# Patient Record
Sex: Female | Born: 1950
Health system: Southern US, Community
[De-identification: ages and names within clinical notes are randomized; demographics above are authoritative.]

## PROBLEM LIST (undated history)

## (undated) ENCOUNTER — Ambulatory Visit: Admission: EM | Payer: Medicare Other | Source: Home / Self Care

## (undated) DIAGNOSIS — J189 Pneumonia, unspecified organism: Secondary | ICD-10-CM

## (undated) DIAGNOSIS — E119 Type 2 diabetes mellitus without complications: Secondary | ICD-10-CM

## (undated) DIAGNOSIS — J302 Other seasonal allergic rhinitis: Secondary | ICD-10-CM

## (undated) DIAGNOSIS — C50911 Malignant neoplasm of unspecified site of right female breast: Secondary | ICD-10-CM

## (undated) DIAGNOSIS — J441 Chronic obstructive pulmonary disease with (acute) exacerbation: Secondary | ICD-10-CM

## (undated) DIAGNOSIS — F419 Anxiety disorder, unspecified: Secondary | ICD-10-CM

## (undated) DIAGNOSIS — R0602 Shortness of breath: Secondary | ICD-10-CM

## (undated) DIAGNOSIS — Z923 Personal history of irradiation: Secondary | ICD-10-CM

## (undated) DIAGNOSIS — K5732 Diverticulitis of large intestine without perforation or abscess without bleeding: Secondary | ICD-10-CM

## (undated) DIAGNOSIS — K219 Gastro-esophageal reflux disease without esophagitis: Secondary | ICD-10-CM

## (undated) DIAGNOSIS — K5900 Constipation, unspecified: Secondary | ICD-10-CM

## (undated) DIAGNOSIS — Z720 Tobacco use: Secondary | ICD-10-CM

## (undated) DIAGNOSIS — R197 Diarrhea, unspecified: Secondary | ICD-10-CM

## (undated) DIAGNOSIS — E669 Obesity, unspecified: Secondary | ICD-10-CM

## (undated) DIAGNOSIS — E785 Hyperlipidemia, unspecified: Secondary | ICD-10-CM

## (undated) DIAGNOSIS — R519 Headache, unspecified: Secondary | ICD-10-CM

## (undated) DIAGNOSIS — R600 Localized edema: Secondary | ICD-10-CM

## (undated) DIAGNOSIS — Z9221 Personal history of antineoplastic chemotherapy: Secondary | ICD-10-CM

## (undated) DIAGNOSIS — R51 Headache: Secondary | ICD-10-CM

## (undated) DIAGNOSIS — I10 Essential (primary) hypertension: Secondary | ICD-10-CM

## (undated) DIAGNOSIS — R9431 Abnormal electrocardiogram [ECG] [EKG]: Secondary | ICD-10-CM

## (undated) DIAGNOSIS — J449 Chronic obstructive pulmonary disease, unspecified: Secondary | ICD-10-CM

## (undated) DIAGNOSIS — M1990 Unspecified osteoarthritis, unspecified site: Secondary | ICD-10-CM

## (undated) DIAGNOSIS — R011 Cardiac murmur, unspecified: Secondary | ICD-10-CM

## (undated) DIAGNOSIS — J019 Acute sinusitis, unspecified: Secondary | ICD-10-CM

## (undated) DIAGNOSIS — F32A Depression, unspecified: Secondary | ICD-10-CM

## (undated) DIAGNOSIS — F329 Major depressive disorder, single episode, unspecified: Secondary | ICD-10-CM

## (undated) HISTORY — DX: Chronic obstructive pulmonary disease, unspecified: J44.9

## (undated) HISTORY — PX: ABDOMINAL HYSTERECTOMY: SHX81

## (undated) HISTORY — DX: Depression, unspecified: F32.A

## (undated) HISTORY — DX: Diverticulitis of large intestine without perforation or abscess without bleeding: K57.32

## (undated) HISTORY — DX: Chronic obstructive pulmonary disease with (acute) exacerbation: J44.1

## (undated) HISTORY — DX: Essential (primary) hypertension: I10

## (undated) HISTORY — DX: Tobacco use: Z72.0

## (undated) HISTORY — DX: Major depressive disorder, single episode, unspecified: F32.9

## (undated) HISTORY — DX: Obesity, unspecified: E66.9

## (undated) HISTORY — DX: Hyperlipidemia, unspecified: E78.5

## (undated) HISTORY — DX: Anxiety disorder, unspecified: F41.9

## (undated) HISTORY — DX: Gastro-esophageal reflux disease without esophagitis: K21.9

## (undated) HISTORY — PX: BACK SURGERY: SHX140

---
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
## 1989-11-08 DIAGNOSIS — C50911 Malignant neoplasm of unspecified site of right female breast: Secondary | ICD-10-CM

## 1989-11-08 HISTORY — DX: Malignant neoplasm of unspecified site of right female breast: C50.911

## 1989-11-08 HISTORY — PX: BREAST LUMPECTOMY: SHX2

## 1998-06-23 ENCOUNTER — Emergency Department (HOSPITAL_COMMUNITY): Admission: EM | Admit: 1998-06-23 | Discharge: 1998-06-23 | Payer: Self-pay | Admitting: Emergency Medicine

## 1998-11-05 ENCOUNTER — Inpatient Hospital Stay (HOSPITAL_COMMUNITY): Admission: EM | Admit: 1998-11-05 | Discharge: 1998-11-07 | Payer: Self-pay | Admitting: Internal Medicine

## 1998-11-05 ENCOUNTER — Encounter: Payer: Self-pay | Admitting: Family Medicine

## 1998-11-07 ENCOUNTER — Encounter: Payer: Self-pay | Admitting: Internal Medicine

## 1998-12-23 ENCOUNTER — Encounter: Payer: Self-pay | Admitting: Family Medicine

## 1998-12-23 ENCOUNTER — Inpatient Hospital Stay (HOSPITAL_COMMUNITY): Admission: EM | Admit: 1998-12-23 | Discharge: 1998-12-28 | Payer: Self-pay | Admitting: Emergency Medicine

## 1999-01-04 ENCOUNTER — Encounter: Payer: Self-pay | Admitting: Emergency Medicine

## 1999-01-04 ENCOUNTER — Inpatient Hospital Stay (HOSPITAL_COMMUNITY): Admission: EM | Admit: 1999-01-04 | Discharge: 1999-01-07 | Payer: Self-pay | Admitting: Emergency Medicine

## 1999-01-05 ENCOUNTER — Encounter: Payer: Self-pay | Admitting: Internal Medicine

## 2001-01-23 ENCOUNTER — Inpatient Hospital Stay (HOSPITAL_COMMUNITY): Admission: EM | Admit: 2001-01-23 | Discharge: 2001-01-26 | Payer: Self-pay | Admitting: Emergency Medicine

## 2001-01-23 ENCOUNTER — Encounter: Payer: Self-pay | Admitting: Emergency Medicine

## 2001-12-20 ENCOUNTER — Encounter: Payer: Self-pay | Admitting: Internal Medicine

## 2001-12-20 ENCOUNTER — Inpatient Hospital Stay (HOSPITAL_COMMUNITY): Admission: EM | Admit: 2001-12-20 | Discharge: 2001-12-22 | Payer: Self-pay | Admitting: Internal Medicine

## 2002-08-09 ENCOUNTER — Encounter: Payer: Self-pay | Admitting: Emergency Medicine

## 2002-08-09 ENCOUNTER — Inpatient Hospital Stay (HOSPITAL_COMMUNITY): Admission: EM | Admit: 2002-08-09 | Discharge: 2002-08-13 | Payer: Self-pay | Admitting: Emergency Medicine

## 2002-08-12 ENCOUNTER — Encounter: Payer: Self-pay | Admitting: Internal Medicine

## 2002-09-02 ENCOUNTER — Emergency Department (HOSPITAL_COMMUNITY): Admission: EM | Admit: 2002-09-02 | Discharge: 2002-09-02 | Payer: Self-pay | Admitting: Emergency Medicine

## 2002-09-05 ENCOUNTER — Encounter: Admission: RE | Admit: 2002-09-05 | Discharge: 2002-09-05 | Payer: Self-pay | Admitting: Internal Medicine

## 2002-10-08 ENCOUNTER — Encounter: Payer: Self-pay | Admitting: Emergency Medicine

## 2002-10-08 ENCOUNTER — Emergency Department (HOSPITAL_COMMUNITY): Admission: EM | Admit: 2002-10-08 | Discharge: 2002-10-08 | Payer: Self-pay | Admitting: Emergency Medicine

## 2002-11-15 ENCOUNTER — Emergency Department (HOSPITAL_COMMUNITY): Admission: EM | Admit: 2002-11-15 | Discharge: 2002-11-15 | Payer: Self-pay | Admitting: Emergency Medicine

## 2002-11-15 ENCOUNTER — Encounter: Payer: Self-pay | Admitting: Emergency Medicine

## 2002-11-18 ENCOUNTER — Inpatient Hospital Stay (HOSPITAL_COMMUNITY): Admission: EM | Admit: 2002-11-18 | Discharge: 2002-11-25 | Payer: Self-pay | Admitting: Emergency Medicine

## 2002-11-18 ENCOUNTER — Encounter: Payer: Self-pay | Admitting: Emergency Medicine

## 2003-06-28 ENCOUNTER — Encounter: Payer: Self-pay | Admitting: Emergency Medicine

## 2003-06-28 ENCOUNTER — Inpatient Hospital Stay (HOSPITAL_COMMUNITY): Admission: AD | Admit: 2003-06-28 | Discharge: 2003-07-02 | Payer: Self-pay

## 2003-07-01 ENCOUNTER — Encounter: Payer: Self-pay | Admitting: Family Medicine

## 2003-07-22 ENCOUNTER — Inpatient Hospital Stay (HOSPITAL_COMMUNITY): Admission: EM | Admit: 2003-07-22 | Discharge: 2003-07-25 | Payer: Self-pay | Admitting: Emergency Medicine

## 2003-07-22 ENCOUNTER — Encounter: Payer: Self-pay | Admitting: Emergency Medicine

## 2003-12-19 ENCOUNTER — Emergency Department (HOSPITAL_COMMUNITY): Admission: EM | Admit: 2003-12-19 | Discharge: 2003-12-20 | Payer: Self-pay | Admitting: Emergency Medicine

## 2004-08-04 ENCOUNTER — Emergency Department (HOSPITAL_COMMUNITY): Admission: EM | Admit: 2004-08-04 | Discharge: 2004-08-04 | Payer: Self-pay | Admitting: Emergency Medicine

## 2004-08-08 HISTORY — PX: DOBUTAMINE STRESS ECHO: SHX5426

## 2004-08-16 ENCOUNTER — Emergency Department (HOSPITAL_COMMUNITY): Admission: EM | Admit: 2004-08-16 | Discharge: 2004-08-16 | Payer: Self-pay

## 2004-08-25 ENCOUNTER — Inpatient Hospital Stay (HOSPITAL_COMMUNITY): Admission: EM | Admit: 2004-08-25 | Discharge: 2004-09-01 | Payer: Self-pay | Admitting: Emergency Medicine

## 2004-09-15 ENCOUNTER — Inpatient Hospital Stay (HOSPITAL_COMMUNITY): Admission: EM | Admit: 2004-09-15 | Discharge: 2004-09-20 | Payer: Self-pay | Admitting: Emergency Medicine

## 2004-10-05 ENCOUNTER — Emergency Department (HOSPITAL_COMMUNITY): Admission: EM | Admit: 2004-10-05 | Discharge: 2004-10-05 | Payer: Self-pay | Admitting: Emergency Medicine

## 2004-10-08 ENCOUNTER — Ambulatory Visit: Payer: Self-pay | Admitting: Internal Medicine

## 2004-10-24 ENCOUNTER — Emergency Department (HOSPITAL_COMMUNITY): Admission: EM | Admit: 2004-10-24 | Discharge: 2004-10-24 | Payer: Self-pay

## 2004-11-24 ENCOUNTER — Ambulatory Visit: Payer: Self-pay | Admitting: Internal Medicine

## 2004-11-24 ENCOUNTER — Inpatient Hospital Stay (HOSPITAL_COMMUNITY): Admission: AD | Admit: 2004-11-24 | Discharge: 2004-11-25 | Payer: Self-pay | Admitting: Internal Medicine

## 2004-11-27 ENCOUNTER — Ambulatory Visit: Payer: Self-pay | Admitting: Internal Medicine

## 2005-10-25 ENCOUNTER — Emergency Department (HOSPITAL_COMMUNITY): Admission: EM | Admit: 2005-10-25 | Discharge: 2005-10-25 | Payer: Self-pay | Admitting: Family Medicine

## 2005-11-06 ENCOUNTER — Inpatient Hospital Stay (HOSPITAL_COMMUNITY): Admission: EM | Admit: 2005-11-06 | Discharge: 2005-11-09 | Payer: Self-pay | Admitting: Family Medicine

## 2005-12-26 ENCOUNTER — Inpatient Hospital Stay (HOSPITAL_COMMUNITY): Admission: EM | Admit: 2005-12-26 | Discharge: 2005-12-31 | Payer: Self-pay | Admitting: Emergency Medicine

## 2005-12-28 ENCOUNTER — Encounter: Admission: RE | Admit: 2005-12-28 | Discharge: 2005-12-28 | Payer: Self-pay | Admitting: *Deleted

## 2006-03-03 ENCOUNTER — Emergency Department (HOSPITAL_COMMUNITY): Admission: EM | Admit: 2006-03-03 | Discharge: 2006-03-03 | Payer: Self-pay | Admitting: Family Medicine

## 2006-03-08 ENCOUNTER — Inpatient Hospital Stay (HOSPITAL_COMMUNITY): Admission: EM | Admit: 2006-03-08 | Discharge: 2006-03-17 | Payer: Self-pay | Admitting: Emergency Medicine

## 2006-03-08 ENCOUNTER — Ambulatory Visit: Payer: Self-pay | Admitting: Critical Care Medicine

## 2006-03-18 ENCOUNTER — Ambulatory Visit: Payer: Self-pay | Admitting: *Deleted

## 2006-03-18 ENCOUNTER — Ambulatory Visit: Payer: Self-pay | Admitting: Internal Medicine

## 2006-08-23 ENCOUNTER — Emergency Department (HOSPITAL_COMMUNITY): Admission: EM | Admit: 2006-08-23 | Discharge: 2006-08-23 | Payer: Self-pay | Admitting: Family Medicine

## 2006-08-24 ENCOUNTER — Emergency Department (HOSPITAL_COMMUNITY): Admission: EM | Admit: 2006-08-24 | Discharge: 2006-08-24 | Payer: Self-pay | Admitting: Family Medicine

## 2006-08-29 ENCOUNTER — Emergency Department (HOSPITAL_COMMUNITY): Admission: EM | Admit: 2006-08-29 | Discharge: 2006-08-29 | Payer: Self-pay | Admitting: Family Medicine

## 2006-09-08 ENCOUNTER — Emergency Department (HOSPITAL_COMMUNITY): Admission: EM | Admit: 2006-09-08 | Discharge: 2006-09-09 | Payer: Self-pay | Admitting: Emergency Medicine

## 2006-09-22 ENCOUNTER — Emergency Department (HOSPITAL_COMMUNITY): Admission: EM | Admit: 2006-09-22 | Discharge: 2006-09-22 | Payer: Self-pay | Admitting: Family Medicine

## 2006-12-21 ENCOUNTER — Emergency Department (HOSPITAL_COMMUNITY): Admission: EM | Admit: 2006-12-21 | Discharge: 2006-12-21 | Payer: Self-pay | Admitting: Family Medicine

## 2007-06-01 ENCOUNTER — Emergency Department (HOSPITAL_COMMUNITY): Admission: EM | Admit: 2007-06-01 | Discharge: 2007-06-01 | Payer: Self-pay | Admitting: Emergency Medicine

## 2007-06-25 ENCOUNTER — Inpatient Hospital Stay (HOSPITAL_COMMUNITY): Admission: EM | Admit: 2007-06-25 | Discharge: 2007-06-30 | Payer: Self-pay | Admitting: Emergency Medicine

## 2007-07-31 ENCOUNTER — Emergency Department (HOSPITAL_COMMUNITY): Admission: EM | Admit: 2007-07-31 | Discharge: 2007-07-31 | Payer: Self-pay | Admitting: Emergency Medicine

## 2007-08-07 ENCOUNTER — Emergency Department (HOSPITAL_COMMUNITY): Admission: EM | Admit: 2007-08-07 | Discharge: 2007-08-07 | Payer: Self-pay | Admitting: Family Medicine

## 2007-10-12 ENCOUNTER — Ambulatory Visit: Payer: Self-pay | Admitting: *Deleted

## 2007-10-12 ENCOUNTER — Inpatient Hospital Stay (HOSPITAL_COMMUNITY): Admission: EM | Admit: 2007-10-12 | Discharge: 2007-10-17 | Payer: Self-pay | Admitting: Emergency Medicine

## 2009-04-02 ENCOUNTER — Ambulatory Visit: Payer: Self-pay | Admitting: Internal Medicine

## 2009-04-02 ENCOUNTER — Inpatient Hospital Stay (HOSPITAL_COMMUNITY): Admission: EM | Admit: 2009-04-02 | Discharge: 2009-04-06 | Payer: Self-pay | Admitting: Emergency Medicine

## 2009-04-06 ENCOUNTER — Encounter: Payer: Self-pay | Admitting: Internal Medicine

## 2009-04-06 DIAGNOSIS — J441 Chronic obstructive pulmonary disease with (acute) exacerbation: Secondary | ICD-10-CM

## 2009-04-06 DIAGNOSIS — E1122 Type 2 diabetes mellitus with diabetic chronic kidney disease: Secondary | ICD-10-CM | POA: Insufficient documentation

## 2009-04-06 DIAGNOSIS — I10 Essential (primary) hypertension: Secondary | ICD-10-CM | POA: Insufficient documentation

## 2009-04-06 DIAGNOSIS — F17200 Nicotine dependence, unspecified, uncomplicated: Secondary | ICD-10-CM | POA: Insufficient documentation

## 2009-04-06 DIAGNOSIS — Z720 Tobacco use: Secondary | ICD-10-CM

## 2009-04-06 HISTORY — DX: Chronic obstructive pulmonary disease with (acute) exacerbation: J44.1

## 2009-05-13 ENCOUNTER — Ambulatory Visit: Payer: Self-pay | Admitting: Internal Medicine

## 2009-05-13 ENCOUNTER — Encounter: Payer: Self-pay | Admitting: Internal Medicine

## 2009-05-13 LAB — CONVERTED CEMR LAB
Blood Glucose, Fingerstick: 115
TSH: 1.205 microintl units/mL (ref 0.350–4.500)

## 2009-05-14 ENCOUNTER — Telehealth: Payer: Self-pay | Admitting: Internal Medicine

## 2009-05-14 DIAGNOSIS — E119 Type 2 diabetes mellitus without complications: Secondary | ICD-10-CM

## 2009-05-14 HISTORY — DX: Type 2 diabetes mellitus without complications: E11.9

## 2009-06-03 ENCOUNTER — Ambulatory Visit: Payer: Self-pay | Admitting: Internal Medicine

## 2009-06-03 ENCOUNTER — Telehealth (INDEPENDENT_AMBULATORY_CARE_PROVIDER_SITE_OTHER): Payer: Self-pay | Admitting: *Deleted

## 2009-06-03 LAB — CONVERTED CEMR LAB: Blood Glucose, Fingerstick: 140

## 2009-06-17 ENCOUNTER — Ambulatory Visit: Payer: Self-pay | Admitting: Internal Medicine

## 2009-06-17 LAB — CONVERTED CEMR LAB: OCCULT 1: NEGATIVE

## 2009-06-30 ENCOUNTER — Ambulatory Visit: Payer: Self-pay | Admitting: Internal Medicine

## 2009-07-11 ENCOUNTER — Inpatient Hospital Stay (HOSPITAL_COMMUNITY): Admission: EM | Admit: 2009-07-11 | Discharge: 2009-07-13 | Payer: Self-pay | Admitting: Infectious Diseases

## 2009-07-11 ENCOUNTER — Encounter: Payer: Self-pay | Admitting: Internal Medicine

## 2009-07-11 ENCOUNTER — Ambulatory Visit: Payer: Self-pay | Admitting: Internal Medicine

## 2009-07-11 ENCOUNTER — Ambulatory Visit: Payer: Self-pay | Admitting: Infectious Diseases

## 2009-07-11 LAB — CONVERTED CEMR LAB: Blood Glucose, Fingerstick: 120

## 2009-07-13 ENCOUNTER — Encounter: Payer: Self-pay | Admitting: Internal Medicine

## 2009-07-31 ENCOUNTER — Encounter: Payer: Self-pay | Admitting: Internal Medicine

## 2009-08-01 ENCOUNTER — Ambulatory Visit: Payer: Self-pay | Admitting: Infectious Diseases

## 2009-08-01 ENCOUNTER — Inpatient Hospital Stay (HOSPITAL_COMMUNITY): Admission: EM | Admit: 2009-08-01 | Discharge: 2009-08-06 | Payer: Self-pay | Admitting: Emergency Medicine

## 2009-08-06 ENCOUNTER — Encounter: Payer: Self-pay | Admitting: Internal Medicine

## 2009-08-27 ENCOUNTER — Ambulatory Visit: Payer: Self-pay | Admitting: Internal Medicine

## 2009-08-27 LAB — CONVERTED CEMR LAB: Blood Glucose, Fingerstick: 100

## 2009-11-06 ENCOUNTER — Emergency Department (HOSPITAL_COMMUNITY): Admission: EM | Admit: 2009-11-06 | Discharge: 2009-11-06 | Payer: Self-pay | Admitting: Family Medicine

## 2009-11-18 ENCOUNTER — Ambulatory Visit: Payer: Self-pay | Admitting: Internal Medicine

## 2009-11-18 LAB — CONVERTED CEMR LAB
Blood Glucose, Fingerstick: 130
Hgb A1c MFr Bld: 5.6 %

## 2009-12-29 ENCOUNTER — Emergency Department (HOSPITAL_COMMUNITY): Admission: EM | Admit: 2009-12-29 | Discharge: 2009-12-29 | Payer: Self-pay | Admitting: Emergency Medicine

## 2009-12-29 ENCOUNTER — Telehealth: Payer: Self-pay | Admitting: Internal Medicine

## 2009-12-29 ENCOUNTER — Encounter: Payer: Self-pay | Admitting: Internal Medicine

## 2009-12-30 ENCOUNTER — Ambulatory Visit: Payer: Self-pay | Admitting: Internal Medicine

## 2009-12-30 DIAGNOSIS — F411 Generalized anxiety disorder: Secondary | ICD-10-CM

## 2009-12-30 DIAGNOSIS — F419 Anxiety disorder, unspecified: Secondary | ICD-10-CM | POA: Insufficient documentation

## 2009-12-30 LAB — CONVERTED CEMR LAB
BUN: 13 mg/dL (ref 6–23)
Calcium: 9.5 mg/dL (ref 8.4–10.5)
Creatinine, Ser: 0.78 mg/dL (ref 0.40–1.20)
Potassium: 4.4 meq/L (ref 3.5–5.3)

## 2010-01-12 ENCOUNTER — Ambulatory Visit (HOSPITAL_COMMUNITY): Admission: RE | Admit: 2010-01-12 | Discharge: 2010-01-12 | Payer: Self-pay | Admitting: Internal Medicine

## 2010-01-26 ENCOUNTER — Encounter: Payer: Self-pay | Admitting: Internal Medicine

## 2010-02-11 ENCOUNTER — Encounter: Payer: Self-pay | Admitting: Internal Medicine

## 2010-02-25 ENCOUNTER — Ambulatory Visit: Payer: Self-pay | Admitting: Internal Medicine

## 2010-02-25 LAB — CONVERTED CEMR LAB: Hgb A1c MFr Bld: 5.9 %

## 2010-04-14 ENCOUNTER — Telehealth: Payer: Self-pay | Admitting: Internal Medicine

## 2010-04-16 ENCOUNTER — Telehealth: Payer: Self-pay | Admitting: Internal Medicine

## 2010-04-17 ENCOUNTER — Telehealth: Payer: Self-pay | Admitting: Internal Medicine

## 2010-04-22 ENCOUNTER — Telehealth: Payer: Self-pay | Admitting: Internal Medicine

## 2010-04-22 ENCOUNTER — Telehealth: Payer: Self-pay | Admitting: *Deleted

## 2010-04-24 ENCOUNTER — Telehealth: Payer: Self-pay | Admitting: Internal Medicine

## 2010-04-27 ENCOUNTER — Telehealth (INDEPENDENT_AMBULATORY_CARE_PROVIDER_SITE_OTHER): Payer: Self-pay | Admitting: *Deleted

## 2010-04-28 ENCOUNTER — Telehealth: Payer: Self-pay | Admitting: Internal Medicine

## 2010-05-06 ENCOUNTER — Telehealth: Payer: Self-pay | Admitting: Internal Medicine

## 2010-05-18 ENCOUNTER — Telehealth: Payer: Self-pay | Admitting: Internal Medicine

## 2010-05-25 ENCOUNTER — Telehealth: Payer: Self-pay | Admitting: Internal Medicine

## 2010-06-01 ENCOUNTER — Encounter: Payer: Self-pay | Admitting: Internal Medicine

## 2010-06-19 ENCOUNTER — Telehealth: Payer: Self-pay | Admitting: Internal Medicine

## 2010-08-11 ENCOUNTER — Telehealth: Payer: Self-pay | Admitting: Internal Medicine

## 2010-11-08 HISTORY — PX: ANTERIOR CERVICAL DECOMP/DISCECTOMY FUSION: SHX1161

## 2010-12-10 NOTE — Assessment & Plan Note (Signed)
Summary: ACUTE-SINUS PROBLEMS.(Vinnie Bobst)/CFB   Vital Signs:  Patient profile:   60 year old female Height:      66 inches (167.64 cm) Weight:      221.0 pounds (100.45 kg) BMI:     35.80 O2 Sat:      98 % on Room air Temp:     99.5 degrees F (37.50 degrees C) oral Pulse rate:   79 / minute BP sitting:   140 / 83  (right arm)  Vitals Entered By: Stanton Kidney Ditzler RN (December 30, 2009 10:50 AM)  O2 Flow:  Room air Is Patient Diabetic? Yes Did you bring your meter with you today? No Pain Assessment Patient in pain? yes     Location: hips and low back Intensity: 7-8 Type: sharp Onset of pain  past 6 months Nutritional Status BMI of > 30 = obese Nutritional Status Detail appetite good CBG Result 113  Have you ever been in a relationship where you felt threatened, hurt or afraid?denies   Does patient need assistance? Functional Status Self care Ambulation Normal Comments Ck right br - hx br ca '91 - area has been sore. Went to ER 12/29/09 due to coughing. Bil hip pain past 6 months. Ck right foot - occ sharp pain in heel area. Has nonproductive cough - pt ? sinus.   Primary Care Provider:  Blondell Reveal MD   History of Present Illness: 60 yr old AAF with PMH as mentioned in the EMR comes to the office with cough/SOB/runny nose. Patient was seen in the ER yesterday due to same symptoms and advised to take her inhalers as prescribed and also to start using loratadine for sinus congestion. Patient had an X-ray which demonstrated no infiltrates or acute changes.  Patient is still coughing (she didn't get the cough suppressant medication prescribed yet); but denies any fever, chills, CP, abdominal pain, nausea,vomiting or any other complaints.  She continue to smoke; is also complaining of bilateral hip pain, which have not improved with the use of tramadol and is inquiring to have a mammogram done.  Patient is currently not taking her spiriva and advair as she ran out of her  medications. She is compliant with the rest of her medications and her DM is well controlled.   Depression History:      The patient denies a depressed mood most of the day and a diminished interest in her usual daily activities.        The patient denies that she feels like life is not worth living, denies that she wishes that she were dead, and denies that she has thought about ending her life.         Preventive Screening-Counseling & Management  Alcohol-Tobacco     Smoking Status: current     Smoking Cessation Counseling: yes     Packs/Day: 4 cigs per day  Caffeine-Diet-Exercise     Does Patient Exercise: yes     Exercise (avg: min/session):    30     Times/week: 2-3  Problems Prior to Update: 1)  Special Screening For Malignant Neoplasms Colon  (ICD-V76.51) 2)  Health Screening  (ICD-V70.0) 3)  Diabetes Mellitus, Type II  (ICD-250.00) 4)  Hyperglycemia  (ICD-790.29) 5)  Tobacco Abuse  (ICD-305.1) 6)  Essential Hypertension, Benign  (ICD-401.1) 7)  COPD  (ICD-496)  Current Problems (verified): 1)  Special Screening For Malignant Neoplasms Colon  (ICD-V76.51) 2)  Health Screening  (ICD-V70.0) 3)  Diabetes Mellitus, Type II  (ICD-250.00) 4)  Hyperglycemia  (ICD-790.29) 5)  Tobacco Abuse  (ICD-305.1) 6)  Essential Hypertension, Benign  (ICD-401.1) 7)  COPD  (ICD-496)  Medications Prior to Update: 1)  Spiriva Handihaler 18 Mcg Caps (Tiotropium Bromide Monohydrate) .Marland Kitchen.. 1 Capsule Inhale Once Daily (Sample Only) 2)  Albuterol Sulfate (2.5 Mg/6ml) 0.083% Nebu (Albuterol Sulfate) .... Use Nebulisation Every 4-6 Hours As Needed For Shortness or Breath 3)  Carvedilol 6.25 Mg Tabs (Carvedilol) .... Take 1 Tablet By Mouth Two Times A Day 4)  Lisinopril-Hydrochlorothiazide 20-25 Mg Tabs (Lisinopril-Hydrochlorothiazide) .... Take 1 Tablet By Mouth Once A Day 5)  Pravachol 40 Mg Tabs (Pravastatin Sodium) .... Take 1 Tablet By Mouth Once A Day At Bedtime 6)  Triamcinolone Acetonide  0.1 % Oint (Triamcinolone Acetonide) .... Apply in The Affected Areas As Directed 7)  Glipizide 5 Mg Xr24h-Tab (Glipizide) .... Take 1 Tablet By Mouth Once A Day 8)  Tramadol Hcl 50 Mg Tabs (Tramadol Hcl) .... Take 1 Tablet By Mouth Four Times A Day As Needed For Pain 9)  Citalopram Hydrobromide 40 Mg Tabs (Citalopram Hydrobromide) .... Take 1 Tablet By Mouth Once A Day 10)  Loratadine 10 Mg Tabs (Loratadine) .... Take 1 Tablet By Mouth Once A Day 11)  Advair Diskus 250-50 Mcg/dose Aepb (Fluticasone-Salmeterol) .... Take 2 Puffs Two Times A Day  (Sample Only) 12)  Prednisone (Pak) 10 Mg Tabs (Prednisone) .... Take 6 Pills For 2 Days, Then Gradually Cut Down One Pill Every Two Days and Then Stop. 13)  Doxycycline Hyclate 100 Mg Solr (Doxycycline Hyclate) .... Take 1 Tablet By Mouth Two Times A Day For 7 Days 14)  Mucinex 600 Mg Xr12h-Tab (Guaifenesin) .... Take 1 Tablet By Mouth Two Times A Day 15)  Promethazine-Codeine 6.25-10 Mg/51ml Syrp (Promethazine-Codeine) .... Take 5ml By Mouth Every 6 Hours As Needed For Cough 16)  Flonase 50 Mcg/act Susp (Fluticasone Propionate) .... Apply 2 Sprays in Each Nostril A Few Hours Before Bedtime  Current Medications (verified): 1)  Spiriva Handihaler 18 Mcg Caps (Tiotropium Bromide Monohydrate) .Marland Kitchen.. 1 Capsule Inhale Once Daily (Sample Only) 2)  Albuterol Sulfate (2.5 Mg/28ml) 0.083% Nebu (Albuterol Sulfate) .... Use Nebulisation Every 4-6 Hours As Needed For Shortness or Breath 3)  Carvedilol 6.25 Mg Tabs (Carvedilol) .... Take 1 Tablet By Mouth Two Times A Day 4)  Lisinopril-Hydrochlorothiazide 20-25 Mg Tabs (Lisinopril-Hydrochlorothiazide) .... Take 1 Tablet By Mouth Once A Day 5)  Pravachol 40 Mg Tabs (Pravastatin Sodium) .... Take 1 Tablet By Mouth Once A Day At Bedtime 6)  Glipizide 5 Mg Xr24h-Tab (Glipizide) .... Take 1 Tablet By Mouth Once A Day 7)  Tramadol Hcl 50 Mg Tabs (Tramadol Hcl) .... Take 1 Tablet By Mouth Four Times A Day As Needed For Pain 8)   Citalopram Hydrobromide 40 Mg Tabs (Citalopram Hydrobromide) .... Take 1 Tablet By Mouth Once A Day 9)  Loratadine 10 Mg Tabs (Loratadine) .... Take 1 Tablet By Mouth Once A Day 10)  Advair Diskus 250-50 Mcg/dose Aepb (Fluticasone-Salmeterol) .... Take 2 Puffs Two Times A Day  (Sample Only) 11)  Promethazine-Codeine 6.25-10 Mg/60ml Syrp (Promethazine-Codeine) .... Take 5ml By Mouth Every 6 Hours As Needed For Cough 12)  Flonase 50 Mcg/act Susp (Fluticasone Propionate) .... Apply 2 Sprays in Each Nostril A Few Hours Before Bedtime  Allergies (verified): No Known Drug Allergies  Past History:  Past Medical History: Last updated: 05/13/2009 1. H/O Acute exacerbation of chronic obstructive pulmonary disease, June 2010,      infectious etiology, to finish full course  therapy with antibiotics       and prednisone taper, clinically resolved.   2. Hypertension, poorly controlled, stopped taking her medications for       more than 1 year, restarting of antihypertensive medications.   3. Hyperlipidemia with an LDL of 190, starting statin therapy.   4. Hyperglycemia most likely secondary from steroid induced versus new       onset type 2 diabetes, A1c 6.9, to follow up with the outpatient       clinic for the possible diagnosis of type 2 diabetes mellitus.   5. Ongoing tobacco abuse.   6. Right-sided musculoskeletal chest pain, resolving, history of       breast cancer, status post lumpectomy, status post chemotherapy and       radiation therapy in 1991, mammogram in 2007 is normal.   7. Chronic obstructive pulmonary disease, history of multiple       admissions for exacerbation and multiple emergency department       visits.   8. Bronchial asthma.   9. History of old rib fractures on the right side.   10.History of sigmoid diverticulosis in August 2008, diverticulitis.   11.History of depression/anxiety.   12.History of hyperglycemia secondary to steroids.   13.History of oral thrush secondary  to steroids.   14.Obesity.   15.History of chest pain, status post dobutamine Cardiolite showing       inferior ischemia, status post catheterization done in October 2005       showing normal left ventricular systolic function and no       significant coronary artery disease.   16.Gastroesophageal reflux disease.   17.Total abdominal hysterectomy.   Family History: Last updated: 2009/05/29 Mom- died in her 40's 2/2 to ovariancancer Dad died at 26 - hepatitis (not sure if Hep C or Hep B) 2 Living brothers - healthy 1 brother died 2/2 hepatitis (?B vs C) and kidney problem 1 Sister died in her 47's - unknown reasons. Was on drugs.  Social History: Last updated: 05/29/2009 Lives in [place] with her husband Takes care of 3 grand children who frequent get cold. Trying to find a job. Has financial difficulties.  Risk Factors: Exercise: yes (12/30/2009)  Risk Factors: Smoking Status: current (12/30/2009) Packs/Day: 4 cigs per day (12/30/2009)  Social History: Packs/Day:  4 cigs per day  Review of Systems       As per HPI.  Physical Exam  General:  alert, well-developed, well-nourished, and well-hydrated.   Lungs:  positive expiratory wheezing, no crackles, fair air movement. Heart:  Normal rate and regular rhythm. S1 and S2 normal without gallop, murmur, click, rub or other extra sounds. Abdomen:  Bowel sounds positive,abdomen soft and non-tender without masses, organomegaly or hernias noted. Extremities:  No clubbing, cyanosis, edema, or deformity noted with normal full range of motion of all joints.   Neurologic:  alert & oriented X3, cranial nerves II-XII intact, and strength normal in all extremities.     Impression & Recommendations:  Problem # 1:  DIABETES MELLITUS, TYPE II (ICD-250.00) Well controlled. Patient encouraged to continue using her medications as directed and to follow a low carb diet.  Her updated medication list for this problem includes:     Lisinopril-hydrochlorothiazide 20-25 Mg Tabs (Lisinopril-hydrochlorothiazide) .Marland Kitchen... Take 1 tablet by mouth once a day    Glipizide 5 Mg Xr24h-tab (Glipizide) .Marland Kitchen... Take 1 tablet by mouth once a day  Orders: Capillary Blood Glucose/CBG ([address])  Reviewed HgBA1c results: 5.6 (11/18/2009)  6.3 (  08/27/2009)  Problem # 2:  ESSENTIAL HYPERTENSION, BENIGN (ICD-401.1) BP was not a goal today; patient reports no to be taking her meds for the last week since she ran out of them and just got them yesterday. Will continue current regimen and will aske her to follow a low sodium diet (less than 2000mg  daily). Will recheck her BP during next visit and will recheck renal function and electrolytes today.  Her updated medication list for this problem includes:    Carvedilol 6.25 Mg Tabs (Carvedilol) .Marland Kitchen... Take 1 tablet by mouth two times a day    Lisinopril-hydrochlorothiazide 20-25 Mg Tabs (Lisinopril-hydrochlorothiazide) .Marland Kitchen... Take 1 tablet by mouth once a day  Orders: T-Basic Metabolic Panel [id number])  BP today: 140/83 Prior BP: 158/97 (11/18/2009)  Problem # 3:  COPD (ICD-496) Patient hasnot been using her advair recently and just ran out of her ventolin. Will provide sample of both medications today; will aske her also to use herflonase and her loratadine to help with sinus congestion. Patient advised to stop smoking and to be compliant with her medications. Se has been instructed tofolow awith Rudell Cobb and Lupita Leash T.; in order to try to help her getting her medications.   Her updated medication list for this problem includes:    Spiriva Handihaler 18 Mcg Caps (Tiotropium bromide monohydrate) .Marland Kitchen... 1 capsule inhale once daily (sample only)    Albuterol Sulfate (2.5 Mg/24ml) 0.083% Nebu (Albuterol sulfate) ..... Use nebulisation every 4-6 hours as needed for shortness or breath    Advair Diskus 250-50 Mcg/dose Aepb (Fluticasone-salmeterol) .Marland Kitchen... Take 2 puffs two times a day  (sample  only)  Problem # 4:  DEPRESSION (ICD-311) No suicidal ideation and no hallucinations. Patient mood is still depressed and she is still experiencing cry spells. Will continue celexa for now and will follow her symptoms in 4 weeks, at that time will adjust her celexa dose if needed and will arrange psycotherapy appoinment.  Her updated medication list for this problem includes:    Citalopram Hydrobromide 40 Mg Tabs (Citalopram hydrobromide) .Marland Kitchen... Take 1 tablet by mouth once a day  Problem # 5:  Preventive Health Care (ICD-V70.0) Will arrange for mammogram screening and will follow on her results.  Complete Medication List: 1)  Spiriva Handihaler 18 Mcg Caps (Tiotropium bromide monohydrate) .Marland Kitchen.. 1 capsule inhale once daily (sample only) 2)  Albuterol Sulfate (2.5 Mg/57ml) 0.083% Nebu (Albuterol sulfate) .... Use nebulisation every 4-6 hours as needed for shortness or breath 3)  Carvedilol 6.25 Mg Tabs (Carvedilol) .... Take 1 tablet by mouth two times a day 4)  Lisinopril-hydrochlorothiazide 20-25 Mg Tabs (Lisinopril-hydrochlorothiazide) .... Take 1 tablet by mouth once a day 5)  Pravachol 40 Mg Tabs (Pravastatin sodium) .... Take 1 tablet by mouth once a day at bedtime 6)  Glipizide 5 Mg Xr24h-tab (Glipizide) .... Take 1 tablet by mouth once a day 7)  Tramadol Hcl 50 Mg Tabs (Tramadol hcl) .... Take 1 tablet by mouth four times a day as needed for pain 8)  Citalopram Hydrobromide 40 Mg Tabs (Citalopram hydrobromide) .... Take 1 tablet by mouth once a day 9)  Loratadine 10 Mg Tabs (Loratadine) .... Take 1 tablet by mouth once a day 10)  Advair Diskus 250-50 Mcg/dose Aepb (Fluticasone-salmeterol) .... Take 2 puffs two times a day  (sample only) 11)  Promethazine-codeine 6.25-10 Mg/66ml Syrp (Promethazine-codeine) .... Take 5ml by mouth every 6 hours as needed for cough 12)  Flonase 50 Mcg/act Susp (Fluticasone propionate) .... Apply 2 sprays in  each nostril a few hours before bedtime 13)   Diclofenac Sodium 75 Mg Tbec (Diclofenac sodium) .... Take 1 tablet by mouth two times a day 14)  Flexeril 5 Mg Tabs (Cyclobenzaprine hcl) .... Take 1 tab by mouth at bedtime  Other Orders: Mammogram (Screening) (Mammo)  Patient Instructions: 1)  Please schedule a follow-up appointment in 1-2 month. 2)  Take your medications as prescribed. 3)  Stop smoking. 4)  Follow a low sodium diet (less than 2000mg  daily) 5)  You need to lose weight. Consider a lower calorie diet and regular exercise.  6)  Most patients (90%) with hip pain will improve with time (2-6 weeks). Keep active but avoid activities that are painful. Apply moist heat and/or ice to affected area several times a day. 7)  Remmeber to use your flexeril at bedtime and take your diclofenac two times a day for 15 days. Prescriptions: FLEXERIL 5 MG TABS (CYCLOBENZAPRINE HCL) Take 1 tab by mouth at bedtime  #10 x 0   Entered and Authorized by:   Vassie Loll MD   Signed by:   Vassie Loll MD on 12/30/2009   Method used:   Electronically to        [facility] (retail)       [address]       [place], [place]  [address]       Ph: [phone]       Fax: [phone]   RxID:   [id number] DICLOFENAC SODIUM 75 MG TBEC (DICLOFENAC SODIUM) Take 1 tablet by mouth two times a day  #30 x 0   Entered and Authorized by:   Vassie Loll MD   Signed by:   Vassie Loll MD on 12/30/2009   Method used:   Electronically to        [facility] (retail)       [address]       [place], [place]  [address]       Ph: [phone]       Fax: [phone]   RxID:   [id number]  Process Orders Check Orders Results:     [facility]: ABN not required for this insurance Tests Sent for requisitioning (December 30, 2009 2:11 PM):     12/30/2009: [facility] -- T-Basic Metabolic Panel [id number] (signed)    Prevention & Chronic  Care Immunizations   Influenza vaccine: Not documented   Influenza vaccine deferral: Deferred  (11/18/2009)    Tetanus booster: Not documented   Td booster deferral: Deferred  (11/18/2009)    Pneumococcal vaccine: Not documented  Colorectal Screening   Hemoccult: Not documented   Hemoccult action/deferral: Deferred  (11/18/2009)    Colonoscopy: Not documented   Colonoscopy action/deferral: Deferred  (12/30/2009)  Other Screening   Pap smear: Not documented    Mammogram: Not documented   Mammogram action/deferral: Ordered  (12/30/2009)   Smoking status: current  (12/30/2009)   Smoking cessation counseling: yes  (12/30/2009)  Diabetes Mellitus   HgbA1C: 5.6  (11/18/2009)    Eye exam: Not documented    Foot exam: Not documented   High risk foot: Not documented   Foot care education: Not documented    Urine microalbumin/creatinine ratio: Not documented    Diabetes flowsheet reviewed?: Yes   Progress toward A1C goal: At goal  Lipids   Total Cholesterol: Not documented   LDL: Not  documented   LDL Direct: Not documented   HDL: Not documented   Triglycerides: Not documented  Hypertension   Last Blood Pressure: 140 / 83  (12/30/2009)   Serum creatinine: Not documented   BMP action: Ordered   Serum potassium Not documented    Hypertension flowsheet reviewed?: Yes   Progress toward BP goal: Deteriorated  Self-Management Support :   Personal Goals (by the next clinic visit) :     Personal A1C goal: 6  (12/30/2009)     Personal blood pressure goal: 130/80  (12/30/2009)     Personal LDL goal: 70  (12/30/2009)    Patient will work on the following items until the next clinic visit to reach self-care goals:     Medications and monitoring: take my medicines every day, check my blood sugar, bring all of my medications to every visit, examine my feet every day  (12/30/2009)     Eating: drink diet soda or water instead of juice or soda, use fresh or frozen vegetables, eat  baked foods instead of fried foods, limit or avoid alcohol  (12/30/2009)     Activity: take a 30 minute walk every day, park at the far end of the parking lot  (12/30/2009)     Home glucose monitoring frequency: 2 times a day  (12/30/2009)    Diabetes self-management support: CBG self-monitoring log, Written self-care plan, Education handout, Resources for patients handout  (12/30/2009)   Diabetes care plan printed   Diabetes education handout printed    Hypertension self-management support: Written self-care plan, Education handout, Resources for patients handout  (12/30/2009)   Hypertension self-care plan printed.   Hypertension education handout printed      Resource handout printed.   Nursing Instructions: Schedule screening mammogram (see order) GI referral for screening colonoscopy (see order)

## 2010-12-10 NOTE — Consult Note (Signed)
Summary: ER consultation  INTERNAL MEDICINE ADMISSION HISTORY AND PHYSICAL PCP:  Dr. Comer Locket CC:  Cough  HPI: [name] is a 60 yo woman with presents to ED with cc of cough.  She reports that this has been ongoing for about 2 weeks.  She reports that this is mainly a tickle in her throat that triggers a paroxysmal cough.  The cough is dry and she denies any fevers.  She says this happens throughout the day.  Furthermore, she is not using flonase b/c it makes her nose run even more.    The EDP thought she was ok for discharge and f/u in clinic, but pt was concerned about this continued cough.  We were asked to assess pt.  Of note, pt has appointment in clinic tomorrow 12/30/09 at 10:00am.  ALLERGIES: NKDA   PAST MEDICAL HISTORY:  1. H/O Acute exacerbation of chronic obstructive pulmonary disease, June 2010,      infectious etiology, to finish full course therapy with antibiotics       and prednisone taper, clinically resolved.   2. Hypertension, poorly controlled, stopped taking her medications for       more than 1 year, restarting of antihypertensive medications.   3. Hyperlipidemia with an LDL of 190, starting statin therapy.   4. Hyperglycemia most likely secondary from steroid induced versus new       onset type 2 diabetes, A1c 6.9, to follow up with the outpatient       clinic for the possible diagnosis of type 2 diabetes mellitus.   5. Ongoing tobacco abuse.   6. Right-sided musculoskeletal chest pain, resolving, history of       breast cancer, status post lumpectomy, status post chemotherapy and       radiation therapy in 1991, mammogram in 2007 is normal.   7. Chronic obstructive pulmonary disease, history of multiple       admissions for exacerbation and multiple emergency department       visits.   8. Bronchial asthma.   9. History of old rib fractures on the right side.   10.History of sigmoid diverticulosis in August 2008, diverticulitis.   11.History of  depression/anxiety.   12.History of hyperglycemia secondary to steroids.   13.History of oral thrush secondary to steroids.   14.Obesity.   15.History of chest pain, status post dobutamine Cardiolite showing       inferior ischemia, status post catheterization done in October 2005       showing normal left ventricular systolic function and no       significant coronary artery disease.   16.Gastroesophageal reflux disease.   17.Total abdominal hysterectomy.    MEDICATIONS:  SPIRIVA HANDIHALER 18 MCG CAPS (TIOTROPIUM BROMIDE MONOHYDRATE) 1 capsule inhale once daily (sample only) ALBUTEROL SULFATE (2.5 MG/3ML) 0.083% NEBU (ALBUTEROL SULFATE) use nebulisation every 4-6 hours as needed for shortness or breath CARVEDILOL 6.25 MG TABS (CARVEDILOL) Take 1 tablet by mouth two times a day LISINOPRIL-HYDROCHLOROTHIAZIDE 20-25 MG TABS (LISINOPRIL-HYDROCHLOROTHIAZIDE) Take 1 tablet by mouth once a day PRAVACHOL 40 MG TABS (PRAVASTATIN SODIUM) Take 1 tablet by mouth once a day at bedtime TRIAMCINOLONE ACETONIDE 0.1 % OINT (TRIAMCINOLONE ACETONIDE) apply in the affected areas as directed GLIPIZIDE 5 MG XR24H-TAB (GLIPIZIDE) Take 1 tablet by mouth once a day TRAMADOL HCL 50 MG TABS (TRAMADOL HCL) Take 1 tablet by mouth four times a day as needed for pain CITALOPRAM HYDROBROMIDE 40 MG TABS (CITALOPRAM HYDROBROMIDE) Take 1 tablet by mouth once a day LORATADINE  10 MG TABS (LORATADINE) Take 1 tablet by mouth once a day MUCINEX 600 MG XR12H-TAB (GUAIFENESIN) Take 1 tablet by mouth two times a day   SOCIAL HISTORY: Lives in [place] with her husband Takes care of 3 grand children who frequent get cold. Trying to find a job. Has financial difficulties.   FAMILY HISTORY Mom- died in her 36's 2/2 to ovariancancer Dad died at 21 - hepatitis (not sure if Hep C or Hep B) 2 Living brothers - healthy 1 brother died 2/2 hepatitis (?B vs C) and kidney problem 1 Sister died in her 50's - unknown reasons. Was on  drugs.   ROS: all other systems reviewed and negative   VITALS: T:99  P:119  BP:194/89  R:25  O2SAT:98  ON:RA             110        152/84    16                PHYSICAL EXAM: General:  alert, obese and cooperative to examination.   Head:  normocephalic and atraumatic.   Eyes:  vision grossly intact, pupils equal, pupils round, pupils reactive to light, no injection and anicteric.   Mouth:  pharynx pink and moist, no erythema, and there is some postnasal drip.   Neck:  supple, full ROM, no thyromegaly, no JVD, and no carotid bruits.   Lungs:  normal respiratory effort, no accessory muscle use, normal breath sounds, no crackles, and no wheezes.   Heart: tachy rate, regular rhythm, no murmur, no gallop, and no rub.   Abdomen:  soft, non-tender, normal bowel sounds, no distention.   Msk:  no joint swelling, no joint warmth, and no redness over joints.   Extremities:  No cyanosis, clubbing, edema  Neurologic:  alert & oriented X3, cranial nerves II-XII intact, strength normal in all extremities, sensation intact to light touch, and gait normal.   Skin:  turgor normal and no rashes.   Psych:  Oriented X3, memory intact for recent and remote, normally interactive, good eye contact, appears anxious and easily tearful  LABS:  TCO2                                     24                0-100            mmol/L  Ionized Calcium                          1.07       l      1.12-1.32        mmol/L  Hemoglobin (HGB)                         12.9              12.0-15.0        g/dL  Hematocrit (HCT)                         38.0              36.0-46.0        %  Sodium (NA)  141               135-145          mEq/L  Potassium (K)                            3.5               3.5-5.1          mEq/L  Chloride                                 109               96-112           mEq/L  Glucose                                  109        h      70-99            mg/dL  BUN                                       14                6-23             mg/dL  Creatinine                               0.5               0.4-1.2          mg/dL   Color, Urine                             YELLOW            YELLOW  Appearance                               CLOUDY     a      CLEAR  Specific Gravity                         1.026             1.005-1.030  pH                                       6.0               5.0-8.0  Urine Glucose                            NEGATIVE          NEG              mg/dL  Bilirubin  NEGATIVE          NEG  Ketones                                  NEGATIVE          NEG              mg/dL  Blood                                    NEGATIVE          NEG  Protein                                  NEGATIVE          NEG              mg/dL  Urobilinogen                             1.0               0.0-1.0          mg/dL  Nitrite                                  NEGATIVE          NEG  Leukocytes                               NEGATIVE          NEG  UDS pending  CXR:  No acute cardiopulmonary disease or significant interval change.  ASSESSMENT AND PLAN: (1)  cough: Pt appears to have had a URI treated in january with doxy and pred taper.  Furthermore, the cough she currently describes seems to be related to post-nasal drip with possible laryngospasm.  Her lungs are currently clear as is her CXR.  She reports using her spiriva which she has samples for but cannot afford.  She does not have the advair (although on her med list).  She reports having prescriptions for albuterol nebs. If symptoms do not resolve soon, ACE-I should be considered as possible culprit, though currently appears to be related to recent URI.   Plan: -Will d/c home and she is to keep her f/u appointment tomorrow.  -Advised to use flonase few hours before going to bed (she reports increased rhinorrhea with use, but this is likely an immidiate effect when the spray goes in).   I assume if used a few hours before bed, this will dry her up by bedtime. -Advised to contiue her inhalers and/or nebs -will provide with a limited supply of phenergan/codeine for cough (considered tussionex but more expensive)  (2)  Depression: pt on citalopram.  She appears to be easily tearful and reports lots of financial stressors.  States she is currently working on [employer] [employer].  She does not recall if she has seen [facility] or [facility].  She should be assessed by both given current situation.

## 2010-12-10 NOTE — Progress Notes (Signed)
Summary: refill/ hla  Phone Note Refill Request Message from:  Fax from Pharmacy on April 24, 2010 10:34 AM  Refills Requested: Medication #1:  CARVEDILOL 6.25 MG TABS Take 1 tablet by mouth two times a day   Dosage confirmed as above?Dosage Confirmed  Medication #2:  ALBUTEROL SULFATE (2.5 MG/3ML) 0.083% NEBU use nebulisation every 4-6 hours as needed for shortness or breath   Dosage confirmed as above?Dosage Confirmed  Medication #3:  LISINOPRIL-HYDROCHLOROTHIAZIDE 20-25 MG TABS Take 1 tablet by mouth once a day   Dosage confirmed as above?Dosage Confirmed  Medication #4:  GLIPIZIDE 5 MG XR24H-TAB Take 1 tablet by mouth once a day   Dosage confirmed as above?Dosage Confirmed Initial call taken by: Marin Roberts RN,  April 24, 2010 10:34 AM  Follow-up for Phone Call       Follow-up by: Blondell Reveal MD,  April 24, 2010 2:18 PM    Prescriptions: GLIPIZIDE 5 MG XR24H-TAB (GLIPIZIDE) Take 1 tablet by mouth once a day  #31 x 6   Entered and Authorized by:   Blondell Reveal MD   Signed by:   Blondell Reveal MD on 04/24/2010   Method used:   Faxed to ...       [facility] (mail-order)       [address]       [place], [place]  [address]       Ph: [phone]       Fax: [phone]   RxID:   [id number] LISINOPRIL-HYDROCHLOROTHIAZIDE 20-25 MG TABS (LISINOPRIL-HYDROCHLOROTHIAZIDE) Take 1 tablet by mouth once a day  #30 x 6   Entered and Authorized by:   Blondell Reveal MD   Signed by:   Blondell Reveal MD on 04/24/2010   Method used:   Faxed to ...       [facility] (mail-order)       [address]       [place], [place]  [address]       Ph: [phone]       Fax: [phone]   RxID:   [id number] CARVEDILOL 6.25 MG TABS (CARVEDILOL) Take 1 tablet by mouth two times a day  #60 x 6   Entered and Authorized by:   Blondell Reveal MD   Signed by:   Blondell Reveal MD on 04/24/2010   Method used:   Faxed to ...       [facility]  (mail-order)       [address]       [place], [place]  [address]       Ph: [phone]       Fax: [phone]   RxID:   [id number] ALBUTEROL SULFATE (2.5 MG/3ML) 0.083% NEBU (ALBUTEROL SULFATE) use nebulisation every 4-6 hours as needed for shortness or breath  #1 x 6   Entered and Authorized by:   Blondell Reveal MD   Signed by:   Blondell Reveal MD on 04/24/2010   Method used:   Faxed to ...       [facility] (mail-order)       [address]       [place], [place]  [address]       Ph: [phone]       Fax: [phone]   RxID:   [id number]

## 2010-12-10 NOTE — Medication Information (Signed)
Summary: [facility]  [facility]   Imported By: Shon Hough 06/04/2010 16:41:12  _____________________________________________________________________  External Attachment:    Type:   Image     Comment:   External Document

## 2010-12-10 NOTE — Assessment & Plan Note (Signed)
Summary: est-ck/fu/meds/cfb   Vital Signs:  Patient profile:   60 year old female Height:      66 inches (167.64 cm) Weight:      224.2 pounds (101.91 kg) O2 Sat:      99 % on Room air Temp:     97.1 degrees F (36.17 degrees C) oral Pulse rate:   84 / minute BP sitting:   158 / 97  (right arm) Cuff size:   large  Vitals Entered By: Krystal Eaton Duncan Dull) (November 18, 2009 10:45 AM)  O2 Flow:  Room air CC: productive cough 2-3 weeks, was seen at urgent care and told it was chronic sinusitis Is Patient Diabetic? Yes Did you bring your meter with you today? No Nutritional Status BMI of > 30 = obese CBG Result 130  Have you ever been in a relationship where you felt threatened, hurt or afraid?No   Does patient need assistance? Functional Status Self care Ambulation Normal   Primary Care Provider:  Blondell Reveal MD  CC:  productive cough 2-3 weeks and was seen at urgent care and told it was chronic sinusitis.  History of Present Illness: 60 yr old AAF with PMH as mentioned in the EMR comes to the office with cough/SOB/runny nose.  1. Symptoms started few weeks ago with runny nose, cough and shortness of breath. Reports that her symptoms are getting worse gradually. Initially cough was productive with mucoid sputum but now has turned into yellowish/greenish colored flum. Denies any fevers, chills.  Reports she was seen in the [facility] on 11/06/09 for these symptoms and was given Solumedrol 80 mg IM x1, Amoxicillin. Patient reports that she finished full course of abx without any relief.   Patient is currently not taking her spiriva and advair as she ran out of her medications.   Preventive Screening-Counseling & Management  Alcohol-Tobacco     Smoking Status: current     Smoking Cessation Counseling: yes     Packs/Day: <0.25  Problems Prior to Update: 1)  Health Screening  (ICD-V70.0) 2)  Diabetes Mellitus, Type II  (ICD-250.00) 3)  Hyperglycemia  (ICD-790.29) 4)  Tobacco  Abuse  (ICD-305.1) 5)  Essential Hypertension, Benign  (ICD-401.1) 6)  COPD  (ICD-496)  Medications Prior to Update: 1)  Spiriva Handihaler 18 Mcg Caps (Tiotropium Bromide Monohydrate) .Marland Kitchen.. 1 Capsule Inhale Once Daily (Sample Only) 2)  Albuterol Sulfate (2.5 Mg/91ml) 0.083% Nebu (Albuterol Sulfate) .... Use Nebulisation Every 4-6 Hours As Needed For Shortness or Breath 3)  Carvedilol 6.25 Mg Tabs (Carvedilol) .... Take 1 Tablet By Mouth Two Times A Day 4)  Lisinopril-Hydrochlorothiazide 20-25 Mg Tabs (Lisinopril-Hydrochlorothiazide) .... Take 1 Tablet By Mouth Once A Day 5)  Pravachol 40 Mg Tabs (Pravastatin Sodium) .... Take 1 Tablet By Mouth Once A Day At Bedtime 6)  Triamcinolone Acetonide 0.1 % Oint (Triamcinolone Acetonide) .... Apply in The Affected Areas As Directed 7)  Glipizide 5 Mg Xr24h-Tab (Glipizide) .... Take 1 Tablet By Mouth Once A Day 8)  Tramadol Hcl 50 Mg Tabs (Tramadol Hcl) .... Take 1 Tablet By Mouth Four Times A Day As Needed For Pain 9)  Citalopram Hydrobromide 40 Mg Tabs (Citalopram Hydrobromide) .... Take 1 Tablet By Mouth Once A Day 10)  Loratadine 10 Mg Tabs (Loratadine) .... Take 1 Tablet By Mouth Once A Day 11)  Advair Diskus 250-50 Mcg/dose Aepb (Fluticasone-Salmeterol) .... Take 2 Puffs Two Times A Day  (Sample Only)  Current Medications (verified): 1)  Spiriva Handihaler 18 Mcg Caps (  Tiotropium Bromide Monohydrate) .Marland Kitchen.. 1 Capsule Inhale Once Daily (Sample Only) 2)  Albuterol Sulfate (2.5 Mg/86ml) 0.083% Nebu (Albuterol Sulfate) .... Use Nebulisation Every 4-6 Hours As Needed For Shortness or Breath 3)  Carvedilol 6.25 Mg Tabs (Carvedilol) .... Take 1 Tablet By Mouth Two Times A Day 4)  Lisinopril-Hydrochlorothiazide 20-25 Mg Tabs (Lisinopril-Hydrochlorothiazide) .... Take 1 Tablet By Mouth Once A Day 5)  Pravachol 40 Mg Tabs (Pravastatin Sodium) .... Take 1 Tablet By Mouth Once A Day At Bedtime 6)  Triamcinolone Acetonide 0.1 % Oint (Triamcinolone Acetonide) ....  Apply in The Affected Areas As Directed 7)  Glipizide 5 Mg Xr24h-Tab (Glipizide) .... Take 1 Tablet By Mouth Once A Day 8)  Tramadol Hcl 50 Mg Tabs (Tramadol Hcl) .... Take 1 Tablet By Mouth Four Times A Day As Needed For Pain 9)  Citalopram Hydrobromide 40 Mg Tabs (Citalopram Hydrobromide) .... Take 1 Tablet By Mouth Once A Day 10)  Loratadine 10 Mg Tabs (Loratadine) .... Take 1 Tablet By Mouth Once A Day 11)  Advair Diskus 250-50 Mcg/dose Aepb (Fluticasone-Salmeterol) .... Take 2 Puffs Two Times A Day  (Sample Only)  Allergies (verified): No Known Drug Allergies  Past History:  Past Medical History: Last updated: 05-21-2009 1. H/O Acute exacerbation of chronic obstructive pulmonary disease, June 2010,      infectious etiology, to finish full course therapy with antibiotics       and prednisone taper, clinically resolved.   2. Hypertension, poorly controlled, stopped taking her medications for       more than 1 year, restarting of antihypertensive medications.   3. Hyperlipidemia with an LDL of 190, starting statin therapy.   4. Hyperglycemia most likely secondary from steroid induced versus new       onset type 2 diabetes, A1c 6.9, to follow up with the outpatient       clinic for the possible diagnosis of type 2 diabetes mellitus.   5. Ongoing tobacco abuse.   6. Right-sided musculoskeletal chest pain, resolving, history of       breast cancer, status post lumpectomy, status post chemotherapy and       radiation therapy in 1991, mammogram in 2007 is normal.   7. Chronic obstructive pulmonary disease, history of multiple       admissions for exacerbation and multiple emergency department       visits.   8. Bronchial asthma.   9. History of old rib fractures on the right side.   10.History of sigmoid diverticulosis in August 2008, diverticulitis.   11.History of depression/anxiety.   12.History of hyperglycemia secondary to steroids.   13.History of oral thrush secondary to  steroids.   14.Obesity.   15.History of chest pain, status post dobutamine Cardiolite showing       inferior ischemia, status post catheterization done in October 2005       showing normal left ventricular systolic function and no       significant coronary artery disease.   16.Gastroesophageal reflux disease.   17.Total abdominal hysterectomy.   Family History: Last updated: 2009/05/21 Mom- died in her 57's 2/2 to ovariancancer Dad died at 102 - hepatitis (not sure if Hep C or Hep B) 2 Living brothers - healthy 1 brother died 2/2 hepatitis (?B vs C) and kidney problem 1 Sister died in her 107's - unknown reasons. Was on drugs.  Risk Factors: Exercise: yes (08/27/2009)  Family History: Reviewed history from 05-21-09 and no changes required. Mom- died in her 68's 2/2  to ovariancancer Dad died at 37 - hepatitis (not sure if Hep C or Hep B) 2 Living brothers - healthy 1 brother died 2/2 hepatitis (?B vs C) and kidney problem 1 Sister died in her 32's - unknown reasons. Was on drugs.  Social History: Reviewed history from 05/13/2009 and no changes required. Lives in [place] with her husband Takes care of 3 grand children who frequent get cold. Trying to find a job. Has financial difficulties.Packs/Day:  <0.25  Review of Systems      See HPI  Physical Exam  General:  alert, well-developed, well-nourished, well-hydrated, overweight-appearing, and mild distress secondary to coughing. Head:  normocephalic, atraumatic, and no abnormalities observed.   Nose:  no external deformity.  runny nose noted Neck:  supple.   Lungs:  normal respiratory effort, no intercostal retractions, no accessory muscle use, normal breath sounds, and no crackles, Mild occasional wheezing noted posteriorly. Heart:  Normal rate and regular rhythm. S1 and S2 normal without gallop, murmur, click, rub or other extra sounds. Abdomen:  Bowel sounds positive,abdomen soft and non-tender without masses,  organomegaly or hernias noted. Pulses:  R radial normal.   Extremities:  No clubbing, cyanosis, edema, or deformity noted with normal full range of motion of all joints.   Neurologic:  alert & oriented X3, cranial nerves II-XII intact, and strength normal in all extremities.     Impression & Recommendations:  Problem # 1:  COPD (ICD-496)  Mild COPD exacerbation, suspect secondary to infectious component along with medication non-compliance. Given O2 sats in >95% and given the above clinical exam, will treat her as an outpatient. Will give Solumedrol 80 mg IM X1 in the clinic and start a prednisone taper for 14 days. Gave samples of spiriva and advair from the clinic. Encouraged her about smoking cessation. Will follow up in a week. Instructed to call the clinic or to go the ED if the symptoms wont get better in a day or two.     Problem # 2:  ESSENTIAL HYPERTENSION, BENIGN (ICD-401.1)  Given Mild COPD exac. suspect BP raise secondary to this. No changes made during this visit.  BP today: 158/97 Prior BP: 125/77 (08/27/2009)     Problem # 3:  TOBACCO ABUSE (ICD-305.1) Discussed many times in the past as well during this office visit. She needs to quit smoking to prevent these COPD exacerbations.    Complete Medication List: 1)  Spiriva Handihaler 18 Mcg Caps (Tiotropium bromide monohydrate) .Marland Kitchen.. 1 capsule inhale once daily (sample only) 2)  Albuterol Sulfate (2.5 Mg/47ml) 0.083% Nebu (Albuterol sulfate) .... Use nebulisation every 4-6 hours as needed for shortness or breath 3)  Carvedilol 6.25 Mg Tabs (Carvedilol) .... Take 1 tablet by mouth two times a day 4)  Lisinopril-hydrochlorothiazide 20-25 Mg Tabs (Lisinopril-hydrochlorothiazide) .... Take 1 tablet by mouth once a day 5)  Pravachol 40 Mg Tabs (Pravastatin sodium) .... Take 1 tablet by mouth once a day at bedtime 6)  Triamcinolone Acetonide 0.1 % Oint (Triamcinolone acetonide) .... Apply in the affected areas as directed 7)   Glipizide 5 Mg Xr24h-tab (Glipizide) .... Take 1 tablet by mouth once a day 8)  Tramadol Hcl 50 Mg Tabs (Tramadol hcl) .... Take 1 tablet by mouth four times a day as needed for pain 9)  Citalopram Hydrobromide 40 Mg Tabs (Citalopram hydrobromide) .... Take 1 tablet by mouth once a day 10)  Loratadine 10 Mg Tabs (Loratadine) .... Take 1 tablet by mouth once a day 11)  Advair Diskus  250-50 Mcg/dose Aepb (Fluticasone-salmeterol) .... Take 2 puffs two times a day  (sample only) 12)  Prednisone (pak) 10 Mg Tabs (Prednisone) .... Take 6 pills for 2 days, then gradually cut down one pill every two days and then stop. 13)  Doxycycline Hyclate 100 Mg Solr (Doxycycline hyclate) .... Take 1 tablet by mouth two times a day for 7 days 14)  Mucinex 600 Mg Xr12h-tab (Guaifenesin) .... Take 1 tablet by mouth two times a day T-Hgb A1C (in-house) (36644IH) T- Capillary Blood Glucose (47425)  Patient Instructions: 1)  Please schedule a follow-up appointment in 1 week. 2)  If your symptoms won't get better in 1-2 days, please call us or go to the emergency. 3)  Your medications are faxed to your pharmacy, please pick them up and start taking them as advised. 4)  Tobacco is very bad for your health and your loved ones! You Should stop smoking!. 5)  Stop Smoking Tips: Choose a Quit date. Cut down before the Quit date. decide what you will do as a substitute when you feel the urge to smoke(gum,toothpick,exercise). Prescriptions: LORATADINE 10 MG TABS (LORATADINE) Take 1 tablet by mouth once a day  #30 x 0   Entered and Authorized by:   Blondell Reveal MD   Signed by:   Blondell Reveal MD on 11/18/2009   Method used:   Electronically to        [facility] (retail)       [address]       [place], [place]  [address]       Ph: [phone]       Fax: [phone]   RxID:   [id number] TRAMADOL HCL 50 MG TABS (TRAMADOL HCL) Take 1 tablet by mouth four times a day as needed for pain   #45 x 0   Entered and Authorized by:   Blondell Reveal MD   Signed by:   Blondell Reveal MD on 11/18/2009   Method used:   Electronically to        [facility] (retail)       [address]       [place], [place]  [address]       Ph: [phone]       Fax: [phone]   RxID:   [id number] MUCINEX 600 MG XR12H-TAB (GUAIFENESIN) Take 1 tablet by mouth two times a day  #30 x 3   Entered and Authorized by:   Blondell Reveal MD   Signed by:   Blondell Reveal MD on 11/18/2009   Method used:   Electronically to        [facility] (retail)       [address]       [place], [place]  [address]       Ph: [phone]       Fax: [phone]   RxID:   [id number] DOXYCYCLINE HYCLATE 100 MG SOLR (DOXYCYCLINE HYCLATE) Take 1 tablet by mouth two times a day for 7 days  #14 x 0   Entered and Authorized by:   Blondell Reveal MD   Signed by:   Blondell Reveal MD on 11/18/2009   Method used:   Electronically to        [facility] (retail)       [address]  [place], [place]  [address]       Ph: [phone]       Fax: [phone]   RxID:   [id number] PREDNISONE (PAK) 10 MG TABS (PREDNISONE) Take 6 pills for 2 days, then gradually cut down one pill every two days and then stop.  #42 x 0   Entered and Authorized by:   Blondell Reveal MD   Signed by:   Blondell Reveal MD on 11/18/2009   Method used:   Electronically to        [facility] (retail)       [address]       [place], [place]  [address]       Ph: [phone]       Fax: [phone]   RxID:   [id number]    Prevention & Chronic Care Immunizations   Influenza vaccine: Not documented   Influenza vaccine deferral: Deferred  (11/18/2009)    Tetanus booster: Not documented   Td booster deferral: Deferred  (11/18/2009)    Pneumococcal vaccine: Not documented  Colorectal Screening   Hemoccult: Not  documented   Hemoccult action/deferral: Deferred  (11/18/2009)    Colonoscopy: Not documented  Other Screening   Pap smear: Not documented    Mammogram: Not documented   Smoking status: current  (11/18/2009)   Smoking cessation counseling: yes  (11/18/2009)  Diabetes Mellitus   HgbA1C: 5.6  (11/18/2009)    Eye exam: Not documented    Foot exam: Not documented   High risk foot: Not documented   Foot care education: Not documented    Urine microalbumin/creatinine ratio: Not documented  Lipids   Total Cholesterol: Not documented   LDL: Not documented   LDL Direct: Not documented   HDL: Not documented   Triglycerides: Not documented  Hypertension   Last Blood Pressure: 158 / 97  (11/18/2009)   Serum creatinine: Not documented   Serum potassium Not documented  Self-Management Support :    Diabetes self-management support: Not documented    Hypertension self-management support: Not documented   Laboratory Results   Blood Tests   Date/Time Received: November 18, 2009 11:02 AM Date/Time Reported: Alric Quan  November 18, 2009 11:02 AM  HGBA1C: 5.6%   (Normal Range: Non-Diabetic - 3-6%   Control Diabetic - 6-8%) CBG Random:: 130mg /dL       Impression & Recommendations:  Her updated medication list for this problem includes:    Spiriva Handihaler 18 Mcg Caps (Tiotropium bromide monohydrate) .Marland Kitchen... 1 capsule inhale once daily (sample only)    Albuterol Sulfate (2.5 Mg/16ml) 0.083% Nebu (Albuterol sulfate) ..... Use nebulisation every 4-6 hours as needed for shortness or breath    Advair Diskus 250-50 Mcg/dose Aepb (Fluticasone-salmeterol) .Marland Kitchen... Take 2 puffs two times a day  (sample only)  Her updated medication list for this problem includes:    Spiriva Handihaler 18 Mcg Caps (Tiotropium bromide monohydrate) .Marland Kitchen... 1 capsule inhale once daily (sample only)    Albuterol Sulfate (2.5 Mg/32ml) 0.083% Nebu (Albuterol sulfate) ..... Use nebulisation every 4-6 hours as  needed for shortness or breath    Advair Diskus 250-50 Mcg/dose Aepb (Fluticasone-salmeterol) .Marland Kitchen... Take 2 puffs two times a day  (sample only)  Her updated medication list for this problem includes:    Spiriva Handihaler 18 Mcg Caps (Tiotropium bromide monohydrate) .Marland Kitchen... 1 capsule inhale once daily (sample only)    Albuterol Sulfate (2.5  Mg/33ml) 0.083% Nebu (Albuterol sulfate) ..... Use nebulisation every 4-6 hours as needed for shortness or breath    Advair Diskus 250-50 Mcg/dose Aepb (Fluticasone-salmeterol) .Marland Kitchen... Take 2 puffs two times a day  (sample only)  Her updated medication list for this problem includes:    Spiriva Handihaler 18 Mcg Caps (Tiotropium bromide monohydrate) .Marland Kitchen... 1 capsule inhale once daily (sample only)    Albuterol Sulfate (2.5 Mg/48ml) 0.083% Nebu (Albuterol sulfate) ..... Use nebulisation every 4-6 hours as needed for shortness or breath    Advair Diskus 250-50 Mcg/dose Aepb (Fluticasone-salmeterol) .Marland Kitchen... Take 2 puffs two times a day  (sample only)   Her updated medication list for this problem includes:    Carvedilol 6.25 Mg Tabs (Carvedilol) .Marland Kitchen... Take 1 tablet by mouth two times a day    Lisinopril-hydrochlorothiazide 20-25 Mg Tabs (Lisinopril-hydrochlorothiazide) .Marland Kitchen... Take 1 tablet by mouth once a day  Her updated medication list for this problem includes:    Carvedilol 6.25 Mg Tabs (Carvedilol) .Marland Kitchen... Take 1 tablet by mouth two times a day    Lisinopril-hydrochlorothiazide 20-25 Mg Tabs (Lisinopril-hydrochlorothiazide) .Marland Kitchen... Take 1 tablet by mouth once a day  Her updated medication list for this problem includes:    Carvedilol 6.25 Mg Tabs (Carvedilol) .Marland Kitchen... Take 1 tablet by mouth two times a day    Lisinopril-hydrochlorothiazide 20-25 Mg Tabs (Lisinopril-hydrochlorothiazide) .Marland Kitchen... Take 1 tablet by mouth once a day   Her updated medication list for this problem includes:    Lisinopril-hydrochlorothiazide 20-25 Mg Tabs (Lisinopril-hydrochlorothiazide) .Marland Kitchen...  Take 1 tablet by mouth once a day    Glipizide 5 Mg Xr24h-tab (Glipizide) .Marland Kitchen... Take 1 tablet by mouth once a day   Complete Medication List: 1)  Spiriva Handihaler 18 Mcg Caps (Tiotropium bromide monohydrate) .Marland Kitchen.. 1 capsule inhale once daily (sample only) 2)  Albuterol Sulfate (2.5 Mg/72ml) 0.083% Nebu (Albuterol sulfate) .... Use nebulisation every 4-6 hours as needed for shortness or breath 3)  Carvedilol 6.25 Mg Tabs (Carvedilol) .... Take 1 tablet by mouth two times a day 4)  Lisinopril-hydrochlorothiazide 20-25 Mg Tabs (Lisinopril-hydrochlorothiazide) .... Take 1 tablet by mouth once a day 5)  Pravachol 40 Mg Tabs (Pravastatin sodium) .... Take 1 tablet by mouth once a day at bedtime 6)  Triamcinolone Acetonide 0.1 % Oint (Triamcinolone acetonide) .... Apply in the affected areas as directed 7)  Glipizide 5 Mg Xr24h-tab (Glipizide) .... Take 1 tablet by mouth once a day 8)  Tramadol Hcl 50 Mg Tabs (Tramadol hcl) .... Take 1 tablet by mouth four times a day as needed for pain 9)  Citalopram Hydrobromide 40 Mg Tabs (Citalopram hydrobromide) .... Take 1 tablet by mouth once a day 10)  Loratadine 10 Mg Tabs (Loratadine) .... Take 1 tablet by mouth once a day 11)  Advair Diskus 250-50 Mcg/dose Aepb (Fluticasone-salmeterol) .... Take 2 puffs two times a day  (sample only) 12)  Prednisone (pak) 10 Mg Tabs (Prednisone) .... Take 6 pills for 2 days, then gradually cut down one pill every two days and then stop. 13)  Doxycycline Hyclate 100 Mg Solr (Doxycycline hyclate) .... Take 1 tablet by mouth two times a day for 7 days 14)  Mucinex 600 Mg Xr12h-tab (Guaifenesin) .... Take 1 tablet by mouth two times a day  Other Orders: T-Hgb A1C (in-house) (50093GH) T- Capillary Blood Glucose (82993)  Appended Document: solumedrol injection//kg    Nurse Visit   Allergies: No Known Drug Allergies  Medication Administration  Injection # 1:    Medication: Solumedrol up  to 125mg     Diagnosis: COPD  (ICD-496)    Route: IM    Site: RUOQ gluteus    Exp Date: 12/2010    Lot #: Leonor [id number]    Mfr: [employer]    Comments: pt given a total of solumedrol 80mg     Patient tolerated injection without complications    Given by: Krystal Eaton Duncan Dull) (November 18, 2009 12:31 PM)  Orders Added: 1)  Solumedrol up to 125mg  [J2930]

## 2010-12-10 NOTE — Progress Notes (Signed)
Summary: med refill/gp  Phone Note Refill Request Message from:  Fax from Pharmacy on December 29, 2009 1:54 PM  Refills Requested: Medication #1:  GLIPIZIDE 5 MG XR24H-TAB Take 1 tablet by mouth once a day   Last Refilled: 11/22/2009  Method Requested: Electronic Initial call taken by: Chinita Pester RN,  December 29, 2009 1:55 PM  Follow-up for Phone Call       Follow-up by: Blondell Reveal MD,  December 29, 2009 8:24 PM    Prescriptions: GLIPIZIDE 5 MG XR24H-TAB (GLIPIZIDE) Take 1 tablet by mouth once a day  #31 x 6   Entered and Authorized by:   Blondell Reveal MD   Signed by:   Blondell Reveal MD on 12/29/2009   Method used:   Electronically to        [facility] (retail)       [address]       [place], [place]  [address]       Ph: [phone]       Fax: [phone]   RxID:   [id number]

## 2010-12-10 NOTE — Progress Notes (Signed)
Summary: REfill/gh  Phone Note Refill Request Message from:  Fax from Pharmacy on May 06, 2010 10:41 AM  Refills Requested: Medication #1:  ADVAIR DISKUS 250-50 MCG/DOSE AEPB Take 1 puff two times a day  Method Requested: Electronic Initial call taken by: Angelina Ok RN,  May 06, 2010 10:41 AM  Follow-up for Phone Call       Follow-up by: Blondell Reveal MD,  May 06, 2010 1:13 PM    Prescriptions: ADVAIR DISKUS 250-50 MCG/DOSE AEPB (FLUTICASONE-SALMETEROL) Take 1 puff two times a day  #1 month supp x 11   Entered and Authorized by:   Blondell Reveal MD   Signed by:   Blondell Reveal MD on 05/06/2010   Method used:   Electronically to        [facility] (retail)       [address]       [place], [place]  [address]       Ph: [phone]       Fax: [phone]   RxID:   [id number]

## 2010-12-10 NOTE — Progress Notes (Signed)
Summary: refill/gg  Phone Note Refill Request  on May 18, 2010 5:12 PM  Refills Requested: Medication #1:  LORATADINE 10 MG TABS Take 1 tablet by mouth once a day   Last Refilled: 04/23/2010  Method Requested: Fax to Local Pharmacy Initial call taken by: Merrie Roof RN,  May 18, 2010 5:12 PM  Follow-up for Phone Call       Follow-up by: Blondell Reveal MD,  May 18, 2010 5:13 PM    Prescriptions: LORATADINE 10 MG TABS (LORATADINE) Take 1 tablet by mouth once a day  #30 x 0   Entered and Authorized by:   Blondell Reveal MD   Signed by:   Blondell Reveal MD on 05/18/2010   Method used:   Faxed to ...       [facility] (mail-order)       [address]       [place], [place]  [address]       Ph: [phone]       Fax: [phone]   RxID:   [id number]

## 2010-12-10 NOTE — Miscellaneous (Signed)
Summary: [facility]/MEDICAL RECORDS  [facility]/MEDICAL RECORDS   Imported By: Margie Billet 02/11/2010 11:18:08  _____________________________________________________________________  External Attachment:    Type:   Image     Comment:   External Document  Appended Document: [facility]/MEDICAL RECORDS Documents have been sent on 02/09/2010.

## 2010-12-10 NOTE — Progress Notes (Signed)
Summary: phone/gg  Phone Note From Pharmacy   Caller: [facility] Summary of Call: Call from [facility] # [id number] checking of directions for advair 250/50.  They want to verify directions of 2 puffs two times a day.   Please advise Initial call taken by: Merrie Roof RN,  April 27, 2010 3:43 PM  Follow-up for Phone Call        Did some digging.  Was D/C 9/10 for COPD exac.  This was first time on Advair and was given a sample 250/50 2 inh two times a day.  Max dose for tx of COPD is 250/50 1 inh two times a day.  So pls change directions to   Advair 250/50 1 inhalation two times a day.  Thanks Follow-up by: Blanch Media MD,  April 27, 2010 3:58 PM  Additional Follow-up for Phone Call Additional follow up Details #1::        Pharmacy informed and took new directions. Additional Follow-up by: Merrie Roof RN,  April 27, 2010 4:15 PM    New/Updated Medications: ADVAIR DISKUS 250-50 MCG/DOSE AEPB (FLUTICASONE-SALMETEROL) Take 1 puff two times a day

## 2010-12-10 NOTE — Progress Notes (Signed)
  Phone Note Call from Patient   Caller: Patient Reason for Call: Acute Illness Summary of Call: pt reeports her leg going numb when she woke up. she freaked and rubbed it hard against carpet. now it is feeling fine. she called 4 am wondering if i could tell her the reason for such numbness. i informe dher that likely this was secondary to positioning of leg and impaired blood supply to her leg which restored upon waking up and changing position. i told her to call back is symptoms returned or to go to ED if numbness did not reverse. Pt voices understanding.  Initial call taken by: Clerance Lav MD,  April 17, 2010 4:17 AM

## 2010-12-10 NOTE — Progress Notes (Signed)
Summary: refill/gg  Phone Note Refill Request  on April 16, 2010 4:13 PM  Refills Requested: Medication #1:  PRAVACHOL 40 MG TABS Take 1 tablet by mouth once a day at bedtime  Medication #2:  ALBUTEROL SULFATE (2.5 MG/3ML) 0.083% NEBU use nebulisation every 4-6 hours as needed for shortness or breath  Method Requested: Electronic Initial call taken by: Merrie Roof RN,  April 16, 2010 4:13 PM  Follow-up for Phone Call       Follow-up by: Blondell Reveal MD,  April 16, 2010 6:49 PM    Prescriptions: ALBUTEROL SULFATE (2.5 MG/3ML) 0.083% NEBU (ALBUTEROL SULFATE) use nebulisation every 4-6 hours as needed for shortness or breath  #1 x 6   Entered and Authorized by:   Blondell Reveal MD   Signed by:   Blondell Reveal MD on 04/16/2010   Method used:   Electronically to        [facility] (retail)       [address]       [place], [place]  [address]       Ph: [phone]       Fax: [phone]   RxID:   [id number] PRAVACHOL 40 MG TABS (PRAVASTATIN SODIUM) Take 1 tablet by mouth once a day at bedtime  #30 x 6   Entered and Authorized by:   Blondell Reveal MD   Signed by:   Blondell Reveal MD on 04/16/2010   Method used:   Electronically to        [facility] (retail)       [address]       [place], [place]  [address]       Ph: [phone]       Fax: [phone]   RxID:   [id number]

## 2010-12-10 NOTE — Progress Notes (Signed)
Summary: med refill/gp  Phone Note Refill Request Message from:  Fax from Pharmacy on June 19, 2010 10:24 AM  Refills Requested: Medication #1:  TRAMADOL HCL 50 MG TABS Take 1 tablet by mouth four times a day as needed for pain Last appt. Apr. 20.   Method Requested: Telephone to Pharmacy Initial call taken by: Chinita Pester RN,  June 19, 2010 10:24 AM  Follow-up for Phone Call       Follow-up by: Blondell Reveal MD,  June 22, 2010 8:39 AM    Prescriptions: TRAMADOL HCL 50 MG TABS (TRAMADOL HCL) Take 1 tablet by mouth four times a day as needed for pain  #45 x 0   Entered and Authorized by:   Blondell Reveal MD   Signed by:   Blondell Reveal MD on 06/22/2010   Method used:   Faxed to ...       [facility] (retail)       [address]       [place], [place]  [address]  [place]       Ph: [phone]       Fax: [phone]   RxID:   [id number]

## 2010-12-10 NOTE — Progress Notes (Signed)
Summary: Change in meter  Phone Note Refill Request Message from:  Patient on May 25, 2010 4:35 PM  Refills Requested: Medication #1:  Prodigy meter  Medication #2:  Albuterol Inhaler  Medication #3:  Prodigy Lancets  Medication #4:  Prodigy Strips [facility]   Method Requested: Electronic Initial call taken by: Angelina Ok RN,  May 25, 2010 4:36 PM  Follow-up for Phone Call       Follow-up by: Blondell Reveal MD,  May 26, 2010 12:05 PM    New/Updated Medications: PRODIGY SAFETY LANCETS 26G  MISC (LANCETS) Use as instructed PRODIGY AUTOCODE BLOOD GLUCOSE  DEVI (BLOOD GLUCOSE MONITORING SUPPL) Use as directed PRODIGY BLOOD GLUCOSE TEST  STRP (GLUCOSE BLOOD) Use as directed Prescriptions: PRODIGY BLOOD GLUCOSE TEST  STRP (GLUCOSE BLOOD) Use as directed  #1 month supp x 11   Entered and Authorized by:   Blondell Reveal MD   Signed by:   Blondell Reveal MD on 05/28/2010   Method used:   Faxed to ...       [facility] (mail-order)       [address]       [place], [place]  [address]       Ph: [phone]       Fax: [phone]   RxID:   [id number] ALBUTEROL SULFATE (2.5 MG/3ML) 0.083% NEBU (ALBUTEROL SULFATE) use nebulisation every 4-6 hours as needed for shortness or breath  #1 x 11   Entered and Authorized by:   Blondell Reveal MD   Signed by:   Blondell Reveal MD on 05/28/2010   Method used:   Faxed to ...       [facility] (mail-order)       [address]       [place], [place]  [address]       Ph: [phone]       Fax: [phone]   RxID:   [id number] PRODIGY AUTOCODE BLOOD GLUCOSE  DEVI (BLOOD GLUCOSE MONITORING SUPPL) Use as directed  #1 month supp x 11   Entered and Authorized by:   Blondell Reveal MD   Signed by:   Blondell Reveal MD on 05/28/2010   Method used:   Faxed to ...       [facility] (mail-order)       [address]       [place], [place]  [address]       Ph: [phone]       Fax:  [phone]   RxID:   [id number] PRODIGY SAFETY LANCETS 26G  MISC (LANCETS) Use as instructed  #1 month supp x 11   Entered and Authorized by:   Blondell Reveal MD   Signed by:   Blondell Reveal MD on 05/28/2010   Method used:   Faxed to ...       [facility] (mail-order)       [address]       [place], [place]  [address]       Ph: [phone]       Fax: [phone]   RxID:   [id number]

## 2010-12-10 NOTE — Progress Notes (Signed)
Summary: PAGE 2/  Phone Note Refill Request Message from:  Fax from Pharmacy on April 22, 2010 5:50 PM  Refills Requested: Medication #1:  PRAVACHOL 40 MG TABS Take 1 tablet by mouth once a day at bedtime  Medication #2:  GLIPIZIDE 5 MG XR24H-TAB Take 1 tablet by mouth once a day  Medication #3:  TRAMADOL HCL 50 MG TABS Take 1 tablet by mouth four times a day as needed for pain  Medication #4:  CITALOPRAM HYDROBROMIDE 40 MG TABS Take 1 tablet by mouth once a day Initial call taken by: [place] Roberts RN,  April 22, 2010 5:50 PM  Follow-up for Phone Call       Follow-up by: Blondell Reveal MD,  April 23, 2010 5:34 PM    Prescriptions: PRAVACHOL 40 MG TABS (PRAVASTATIN SODIUM) Take 1 tablet by mouth once a day at bedtime  #30 x 6   Entered and Authorized by:   Blondell Reveal MD   Signed by:   Blondell Reveal MD on 04/23/2010   Method used:   Faxed to ...       [facility] (mail-order)       [address]       [place], [place]  [address]       Ph: [phone]       Fax: [phone]   RxID:   [id number] TRAMADOL HCL 50 MG TABS (TRAMADOL HCL) Take 1 tablet by mouth four times a day as needed for pain  #45 x 0   Entered and Authorized by:   Blondell Reveal MD   Signed by:   Blondell Reveal MD on 04/23/2010   Method used:   Faxed to ...       [facility] (mail-order)       [address]       [place], [place]  [address]       Ph: [phone]       Fax: [phone]   RxID:   [id number] CITALOPRAM HYDROBROMIDE 40 MG TABS (CITALOPRAM HYDROBROMIDE) Take 1 tablet by mouth once a day  #30 x 3   Entered and Authorized by:   Blondell Reveal MD   Signed by:   Blondell Reveal MD on 04/23/2010   Method used:   Faxed to ...       [facility] (mail-order)       [address]       [place], [place]  [address]       Ph: [phone]       Fax: [phone]   RxID:   [id number] SPIRIVA HANDIHALER 18 MCG CAPS (TIOTROPIUM BROMIDE MONOHYDRATE) 1  capsule inhale once daily (sample only)  #1 month supp x 11   Entered and Authorized by:   Blondell Reveal MD   Signed by:   Blondell Reveal MD on 04/23/2010   Method used:   Faxed to ...       [facility] (mail-order)       [address]       [place], [place]  [address]       Ph: [phone]       Fax: [phone]   RxID:   [id number]

## 2010-12-10 NOTE — Assessment & Plan Note (Signed)
Summary: F/U/EST/VS   Vital Signs:  Patient profile:   61 year old female Height:      66 inches (167.64 cm) Weight:      232.2 pounds (100.45 kg) BMI:     35.80 Temp:     99.0 degrees F (37.22 degrees C) oral Pulse rate:   96 / minute BP sitting:   162 / 90  (right arm) Cuff size:   large  Vitals Entered By: Theotis Barrio NT II (February 25, 2010 4:08 PM) CC: ON GOING COUGH FOR ABOUT  2 WEEKS - RUNNY NOSE- PRODUCTIVE COUGH - CLEAR  / MEDICATION REFILL Is Patient Diabetic? Yes Did you bring your meter with you today? SYSTEM DOWN Pain Assessment Patient in pain? yes     Location: BACK / RIB CAGE Intensity:       5 Type: THROB Onset of pain  ON GOING FOR ABOUT 2 WEEKS Nutritional Status BMI of > 30 = obese CBG Result 119  Have you ever been in a relationship where you felt threatened, hurt or afraid?No  Comments ON GOING COUGH FOR ABOUT 2 WEEKS./ MEDICATION REFILL   Primary Care Provider:  Blondell Reveal MD  CC:  ON GOING COUGH FOR ABOUT  2 WEEKS - RUNNY NOSE- PRODUCTIVE COUGH - CLEAR  / MEDICATION REFILL.  History of Present Illness: 60 yr old AAF with PMH as mentioned in the EMR comes to the office with cough/SOB/runny nose. Patient was seen in the clinic multiple times for similar complaints and it has been thought to be secondary to her ongoing tobacco abuse and COPD. Patient reports compliancy to her medications including her inhalers.  Patient reports worsening of her SOB, wheezing, productive muvoid sputum, and denies any chest pain, fever, chills or any other systemic complaints.   Preventive Screening-Counseling & Management  Alcohol-Tobacco     Smoking Status: current     Smoking Cessation Counseling: yes     Packs/Day: 4 cigs per day  Caffeine-Diet-Exercise     Does Patient Exercise: yes     Exercise (avg: min/session):    30     Times/week: 2-3  Problems Prior to Update: 1)  Depression  (ICD-311) 2)  Health Screening  (ICD-V70.0) 3)  Diabetes  Mellitus, Type II  (ICD-250.00) 4)  Hyperglycemia  (ICD-790.29) 5)  Tobacco Abuse  (ICD-305.1) 6)  Essential Hypertension, Benign  (ICD-401.1) 7)  COPD  (ICD-496)  Medications Prior to Update: 1)  Spiriva Handihaler 18 Mcg Caps (Tiotropium Bromide Monohydrate) .Marland Kitchen.. 1 Capsule Inhale Once Daily (Sample Only) 2)  Albuterol Sulfate (2.5 Mg/56ml) 0.083% Nebu (Albuterol Sulfate) .... Use Nebulisation Every 4-6 Hours As Needed For Shortness or Breath 3)  Carvedilol 6.25 Mg Tabs (Carvedilol) .... Take 1 Tablet By Mouth Two Times A Day 4)  Lisinopril-Hydrochlorothiazide 20-25 Mg Tabs (Lisinopril-Hydrochlorothiazide) .... Take 1 Tablet By Mouth Once A Day 5)  Pravachol 40 Mg Tabs (Pravastatin Sodium) .... Take 1 Tablet By Mouth Once A Day At Bedtime 6)  Glipizide 5 Mg Xr24h-Tab (Glipizide) .... Take 1 Tablet By Mouth Once A Day 7)  Tramadol Hcl 50 Mg Tabs (Tramadol Hcl) .... Take 1 Tablet By Mouth Four Times A Day As Needed For Pain 8)  Citalopram Hydrobromide 40 Mg Tabs (Citalopram Hydrobromide) .... Take 1 Tablet By Mouth Once A Day 9)  Loratadine 10 Mg Tabs (Loratadine) .... Take 1 Tablet By Mouth Once A Day 10)  Advair Diskus 250-50 Mcg/dose Aepb (Fluticasone-Salmeterol) .... Take 2 Puffs Two Times A Day  (  Sample Only) 11)  Promethazine-Codeine 6.25-10 Mg/42ml Syrp (Promethazine-Codeine) .... Take 5ml By Mouth Every 6 Hours As Needed For Cough 12)  Flonase 50 Mcg/act Susp (Fluticasone Propionate) .... Apply 2 Sprays in Each Nostril A Few Hours Before Bedtime  Allergies (verified): No Known Drug Allergies  Past History:  Past Medical History: Last updated: Jun 05, 2009 1. H/O Acute exacerbation of chronic obstructive pulmonary disease, June 2010,      infectious etiology, to finish full course therapy with antibiotics       and prednisone taper, clinically resolved.   2. Hypertension, poorly controlled, stopped taking her medications for       more than 1 year, restarting of antihypertensive  medications.   3. Hyperlipidemia with an LDL of 190, starting statin therapy.   4. Hyperglycemia most likely secondary from steroid induced versus new       onset type 2 diabetes, A1c 6.9, to follow up with the outpatient       clinic for the possible diagnosis of type 2 diabetes mellitus.   5. Ongoing tobacco abuse.   6. Right-sided musculoskeletal chest pain, resolving, history of       breast cancer, status post lumpectomy, status post chemotherapy and       radiation therapy in 1991, mammogram in 2007 is normal.   7. Chronic obstructive pulmonary disease, history of multiple       admissions for exacerbation and multiple emergency department       visits.   8. Bronchial asthma.   9. History of old rib fractures on the right side.   10.History of sigmoid diverticulosis in August 2008, diverticulitis.   11.History of depression/anxiety.   12.History of hyperglycemia secondary to steroids.   13.History of oral thrush secondary to steroids.   14.Obesity.   15.History of chest pain, status post dobutamine Cardiolite showing       inferior ischemia, status post catheterization done in October 2005       showing normal left ventricular systolic function and no       significant coronary artery disease.   16.Gastroesophageal reflux disease.   17.Total abdominal hysterectomy.   Family History: Last updated: 06/05/2009 Mom- died in her 6's 2/2 to ovariancancer Dad died at 11 - hepatitis (not sure if Hep C or Hep B) 2 Living brothers - healthy 1 brother died 2/2 hepatitis (?B vs C) and kidney problem 1 Sister died in her 1's - unknown reasons. Was on drugs.  Risk Factors: Exercise: yes (02/25/2010)  Family History: Reviewed history from 06-05-2009 and no changes required. Mom- died in her 27's 2/2 to ovariancancer Dad died at 62 - hepatitis (not sure if Hep C or Hep B) 2 Living brothers - healthy 1 brother died 2/2 hepatitis (?B vs C) and kidney problem 1 Sister died in her 60's  - unknown reasons. Was on drugs.  Social History: Reviewed history from 06/05/09 and no changes required. Lives in [place] with her husband Takes care of 3 grand children who frequent get cold. Trying to find a job. Has financial difficulties.  Review of Systems      See HPI  Physical Exam  General:  alert, well-developed, well-nourished, and well-hydrated.   Head:  normocephalic, atraumatic, and no abnormalities observed.   Eyes:  No corneal or conjunctival inflammation noted. EOMI. Perrla. Funduscopic exam benign, without hemorrhages, exudates or papilledema. Vision grossly normal. Mouth:  Oral mucosa and oropharynx without lesions or exudates.  Teeth in good repair. Neck:  supple.  Lungs:  positive expiratory wheezing, no crackles, fair air movement. Heart:  Normal rate and regular rhythm. S1 and S2 normal without gallop, murmur, click, rub or other extra sounds. Abdomen:  Bowel sounds positive,abdomen soft and non-tender without masses, organomegaly or hernias noted. Pulses:  R radial normal.   Extremities:  No clubbing, cyanosis, edema, or deformity noted with normal full range of motion of all joints.   Neurologic:  alert & oriented X3, cranial nerves II-XII intact, and strength normal in all extremities.     Impression & Recommendations:  Problem # 1:  COPD (ICD-496) Mild COPD exacerbation. Patient is still smoking despite repeated attemps to quit in the setting of COPD exac. Will start prednisone taper for 12 days along with Doxycycline. I gave patient samples of spiriva, advair, ventolin. Recommended to call the clinic or go to ED if SOB worsens.  Her updated medication list for this problem includes:    Spiriva Handihaler 18 Mcg Caps (Tiotropium bromide monohydrate) .Marland Kitchen... 1 capsule inhale once daily (sample only)    Albuterol Sulfate (2.5 Mg/40ml) 0.083% Nebu (Albuterol sulfate) ..... Use nebulisation every 4-6 hours as needed for shortness or breath    Advair Diskus  250-50 Mcg/dose Aepb (Fluticasone-salmeterol) .Marland Kitchen... Take 2 puffs two times a day  (sample only)  Problem # 2:  ESSENTIAL HYPERTENSION, BENIGN (ICD-401.1) Slightly elevated, in the setting of Acute COPD exacerbation.  Her updated medication list for this problem includes:    Carvedilol 6.25 Mg Tabs (Carvedilol) .Marland Kitchen... Take 1 tablet by mouth two times a day    Lisinopril-hydrochlorothiazide 20-25 Mg Tabs (Lisinopril-hydrochlorothiazide) .Marland Kitchen... Take 1 tablet by mouth once a day  BP today: 162/90 Prior BP: 140/83 (12/30/2009)  Labs Reviewed: K+: 4.4 (12/30/2009) Creat: : 0.78 (12/30/2009)     Complete Medication List: 1)  Spiriva Handihaler 18 Mcg Caps (Tiotropium bromide monohydrate) .Marland Kitchen.. 1 capsule inhale once daily (sample only) 2)  Albuterol Sulfate (2.5 Mg/68ml) 0.083% Nebu (Albuterol sulfate) .... Use nebulisation every 4-6 hours as needed for shortness or breath 3)  Carvedilol 6.25 Mg Tabs (Carvedilol) .... Take 1 tablet by mouth two times a day 4)  Lisinopril-hydrochlorothiazide 20-25 Mg Tabs (Lisinopril-hydrochlorothiazide) .... Take 1 tablet by mouth once a day 5)  Pravachol 40 Mg Tabs (Pravastatin sodium) .... Take 1 tablet by mouth once a day at bedtime 6)  Glipizide 5 Mg Xr24h-tab (Glipizide) .... Take 1 tablet by mouth once a day 7)  Tramadol Hcl 50 Mg Tabs (Tramadol hcl) .... Take 1 tablet by mouth four times a day as needed for pain 8)  Citalopram Hydrobromide 40 Mg Tabs (Citalopram hydrobromide) .... Take 1 tablet by mouth once a day 9)  Loratadine 10 Mg Tabs (Loratadine) .... Take 1 tablet by mouth once a day 10)  Advair Diskus 250-50 Mcg/dose Aepb (Fluticasone-salmeterol) .... Take 2 puffs two times a day  (sample only) 11)  Promethazine-codeine 6.25-10 Mg/73ml Syrp (Promethazine-codeine) .... Take 5ml by mouth every 6 hours as needed for cough 12)  Flonase 50 Mcg/act Susp (Fluticasone propionate) .... Apply 2 sprays in each nostril a few hours before bedtime 13)  Doxycycline  Hyclate 100 Mg Caps (Doxycycline hyclate) .... Take 1 tablet by mouth two times a day 14)  Prednisone (pak) 10 Mg Tabs (Prednisone) .... Finish the pak as directed 15)  Mucinex 600 Mg Xr12h-tab (Guaifenesin) .... Take 1 tablet by mouth two times a day  Other Orders: T- Capillary Blood Glucose (82948) T-Hgb A1C (in-house) (59563OV)  Patient Instructions: 1)  Please  schedule a follow-up appointment in 3 months. 2)  If your breathing is not getting better, please give [place] a call or seek medical help. 3)  Tobacco is very bad for your health and your loved ones! You Should stop smoking!. 4)  Stop Smoking Tips: Choose a Quit date. Cut down before the Quit date. decide what you will do as a substitute when you feel the urge to smoke(gum,toothpick,exercise). Prescriptions: MUCINEX 600 MG XR12H-TAB (GUAIFENESIN) Take 1 tablet by mouth two times a day  #30 x 0   Entered and Authorized by:   Blondell Reveal MD   Signed by:   Blondell Reveal MD on 02/25/2010   Method used:   Electronically to        [facility] (retail)       [address]       [place], [place]  [address]       Ph: [phone]       Fax: [phone]   RxID:   [id number] PREDNISONE (PAK) 10 MG TABS (PREDNISONE) finish the pak as directed  #1 pak x 0   Entered and Authorized by:   Blondell Reveal MD   Signed by:   Blondell Reveal MD on 02/25/2010   Method used:   Electronically to        [facility] (retail)       [address]       [place], [place]  [address]       Ph: [phone]       Fax: [phone]   RxID:   [id number] DOXYCYCLINE HYCLATE 100 MG CAPS (DOXYCYCLINE HYCLATE) Take 1 tablet by mouth two times a day  #20 x 0   Entered and Authorized by:   Blondell Reveal MD   Signed by:   Blondell Reveal MD on 02/25/2010   Method used:   Electronically to        [facility] (retail)       [address]       [place], [place]   [address]       Ph: [phone]       Fax: [phone]   RxID:   [id number]    Laboratory Results   Blood Tests   Date/Time Received: February 25, 2010 4:29 PM  Date/Time Reported: Burke Keels  February 25, 2010 4:29 PM   HGBA1C: 5.9%   (Normal Range: Non-Diabetic - 3-6%   Control Diabetic - 6-8%) CBG Random:: 119mg /dL     Prendisone pack is for 12 days per Dr [facility] informed Merrie Roof RN  February 26, 2010 4:32 PM  Prevention & Chronic Care Immunizations   Influenza vaccine: Not documented   Influenza vaccine deferral: Deferred  (11/18/2009)    Tetanus booster: Not documented   Td booster deferral: Deferred  (11/18/2009)    Pneumococcal vaccine: Not documented  Colorectal Screening   Hemoccult: Not documented   Hemoccult action/deferral: Deferred  (11/18/2009)    Colonoscopy: Not documented   Colonoscopy action/deferral: Deferred  (12/30/2009)  Other Screening   Pap smear: Not documented    Mammogram: ASSESSMENT: Negative - BI-RADS 1^MS DIGITAL SCREENING  (01/12/2010)   Mammogram action/deferral: Ordered  (12/30/2009)   Smoking status: current  (02/25/2010)   Smoking cessation counseling: yes  (02/25/2010)  Diabetes Mellitus   HgbA1C: 5.9  (  02/25/2010)    Eye exam: Not documented    Foot exam: Not documented   High risk foot: Not documented   Foot care education: Not documented    Urine microalbumin/creatinine ratio: Not documented  Lipids   Total Cholesterol: Not documented   LDL: Not documented   LDL Direct: Not documented   HDL: Not documented   Triglycerides: Not documented  Hypertension   Last Blood Pressure: 162 / 90  (02/25/2010)   Serum creatinine: 0.78  (12/30/2009)   BMP action: Ordered   Serum potassium 4.4  (12/30/2009)  Self-Management Support :   Personal Goals (by the next clinic visit) :     Personal A1C goal: 6  (12/30/2009)     Personal blood pressure goal: 130/80  (12/30/2009)     Personal LDL goal: 70   (12/30/2009)     Home glucose monitoring frequency: 2 times a day  (12/30/2009)    Diabetes self-management support: CBG self-monitoring log, Written self-care plan, Education handout, Resources for patients handout  (12/30/2009)    Hypertension self-management support: Written self-care plan, Education handout, Resources for patients handout  (12/30/2009)

## 2010-12-10 NOTE — Progress Notes (Signed)
Summary: Refill/gh  Phone Note Refill Request Message from:  Fax from Pharmacy on April 28, 2010 9:07 AM  Pt says that she is to get an Albuterol Inhaler as well.   Method Requested: Fax to Local Pharmacy Initial call taken by: Angelina Ok RN,  April 28, 2010 9:08 AM  Follow-up for Phone Call       Follow-up by: Blondell Reveal MD,  April 29, 2010 3:07 PM    Prescriptions: ALBUTEROL SULFATE (2.5 MG/3ML) 0.083% NEBU (ALBUTEROL SULFATE) use nebulisation every 4-6 hours as needed for shortness or breath  #1 x 11   Entered and Authorized by:   Blondell Reveal MD   Signed by:   Blondell Reveal MD on 04/29/2010   Method used:   Faxed to ...       [facility] (mail-order)       [address]       [place], [place]  [address]       Ph: [phone]       Fax: [phone]   RxID:   [id number]

## 2010-12-10 NOTE — Progress Notes (Signed)
Summary: refill/ hla  Phone Note Refill Request Message from:  Pharmacy on April 14, 2010 10:03 AM  Refills Requested: Medication #1:  CARVEDILOL 6.25 MG TABS Take 1 tablet by mouth two times a day   Dosage confirmed as above?Dosage Confirmed   Last Refilled: 4/20  Medication #2:  LISINOPRIL-HYDROCHLOROTHIAZIDE 20-25 MG TABS Take 1 tablet by mouth once a day   Dosage confirmed as above?Dosage Confirmed   Last Refilled: 4/20  Medication #3:  CITALOPRAM HYDROBROMIDE 40 MG TABS Take 1 tablet by mouth once a day   Dosage confirmed as above?Dosage Confirmed   Last Refilled: 4/20 Initial call taken by: Marin Roberts RN,  April 14, 2010 10:05 AM  Follow-up for Phone Call       Follow-up by: Blondell Reveal MD,  April 15, 2010 5:06 PM    Prescriptions: CITALOPRAM HYDROBROMIDE 40 MG TABS (CITALOPRAM HYDROBROMIDE) Take 1 tablet by mouth once a day  #30 x 3   Entered and Authorized by:   Blondell Reveal MD   Signed by:   Blondell Reveal MD on 04/15/2010   Method used:   Electronically to        [facility] (retail)       [address]       [place], [place]  [address]       Ph: [phone]       Fax: [phone]   RxID:   [id number] LISINOPRIL-HYDROCHLOROTHIAZIDE 20-25 MG TABS (LISINOPRIL-HYDROCHLOROTHIAZIDE) Take 1 tablet by mouth once a day  #30 x 6   Entered and Authorized by:   Blondell Reveal MD   Signed by:   Blondell Reveal MD on 04/15/2010   Method used:   Electronically to        [facility] (retail)       [address]       [place], [place]  [address]       Ph: [phone]       Fax: [phone]   RxID:   [id number] CARVEDILOL 6.25 MG TABS (CARVEDILOL) Take 1 tablet by mouth two times a day  #60 x 6   Entered and Authorized by:   Blondell Reveal MD   Signed by:   Blondell Reveal MD on 04/15/2010   Method used:   Electronically to        [facility] (retail)       [address]       [place], [place]  [address]       Ph: [phone]       Fax: [phone]   RxID:   [id number]

## 2010-12-10 NOTE — Progress Notes (Signed)
Summary: CHANGING PHARM/PAGE 1/ HLA  Phone Note Refill Request Message from:  Fax from Pharmacy on April 22, 2010 5:49 PM  Refills Requested: Medication #1:  SPIRIVA HANDIHALER 18 MCG CAPS 1 capsule inhale once daily (sample only)  Medication #2:  ALBUTEROL SULFATE (2.5 MG/3ML) 0.083% NEBU use nebulisation every 4-6 hours as needed for shortness or breath  Medication #3:  CARVEDILOL 6.25 MG TABS Take 1 tablet by mouth two times a day  Medication #4:  LISINOPRIL-HYDROCHLOROTHIAZIDE 20-25 MG TABS Take 1 tablet by mouth once a day PT IS CHANGING PHARMACIES  Initial call taken by: Marin Roberts RN,  April 22, 2010 5:49 PM  Follow-up for Phone Call        what pharmacy is she changing to? Should I send it to the pharmacy thats recorded on the computer? Follow-up by: Blondell Reveal MD,  April 23, 2010 5:33 PM

## 2010-12-10 NOTE — Progress Notes (Signed)
Summary: PAGE 3/HLA  Phone Note Refill Request Message from:  Fax from Pharmacy on April 22, 2010 5:51 PM  Refills Requested: Medication #1:  LORATADINE 10 MG TABS Take 1 tablet by mouth once a day  Medication #2:  ADVAIR DISKUS 250-50 MCG/DOSE AEPB Take 2 puffs two times a day  (sample only)  Medication #3:  FLONASE 50 MCG/ACT SUSP apply 2 sprays in each nostril a few hours before bedtime Initial call taken by: Marin Roberts RN,  April 22, 2010 5:52 PM  Follow-up for Phone Call       Follow-up by: Blondell Reveal MD,  April 23, 2010 5:36 PM    Prescriptions: FLONASE 50 MCG/ACT SUSP (FLUTICASONE PROPIONATE) apply 2 sprays in each nostril a few hours before bedtime  #1 x 11   Entered and Authorized by:   Blondell Reveal MD   Signed by:   Blondell Reveal MD on 04/23/2010   Method used:   Electronically to        [facility] (retail)       [address]       [place], [place]  [address]       Ph: [phone]       Fax: [phone]   RxID:   [id number] ADVAIR DISKUS 250-50 MCG/DOSE AEPB (FLUTICASONE-SALMETEROL) Take 2 puffs two times a day  (sample only)  #1 month supp x 11   Entered and Authorized by:   Blondell Reveal MD   Signed by:   Blondell Reveal MD on 04/23/2010   Method used:   Electronically to        [facility] (retail)       [address]       [place], [place]  [address]       Ph: [phone]       Fax: [phone]   RxID:   [id number] LORATADINE 10 MG TABS (LORATADINE) Take 1 tablet by mouth once a day  #30 x 0   Entered and Authorized by:   Blondell Reveal MD   Signed by:   Blondell Reveal MD on 04/23/2010   Method used:   Electronically to        [facility] (retail)       [address]       [place], [place]  [address]       Ph: [phone]       Fax: [phone]   RxID:   [id number]

## 2010-12-10 NOTE — Progress Notes (Signed)
Summary: refill/ hla  Phone Note Refill Request Message from:  Fax from Pharmacy on May 25, 2010 3:00 PM  Refills Requested: Medication #1:  LORATADINE 10 MG TABS Take 1 tablet by mouth once a day   Dosage confirmed as above?Dosage Confirmed   Last Refilled: 7/18 med was delivered today to pt, needs refills now  Initial call taken by: Marin Roberts RN,  May 25, 2010 3:01 PM  Follow-up for Phone Call       Follow-up by: Blondell Reveal MD,  May 25, 2010 3:13 PM    Prescriptions: LORATADINE 10 MG TABS (LORATADINE) Take 1 tablet by mouth once a day  #30 x 3   Entered and Authorized by:   Blondell Reveal MD   Signed by:   Blondell Reveal MD on 05/25/2010   Method used:   Faxed to ...       [facility] (retail)       [address]       [place], [place]  [address]  [place]       Ph: [phone]       Fax: [phone]   RxID:   [id number]

## 2010-12-10 NOTE — Progress Notes (Signed)
Summary: refill/gg  Phone Note Refill Request  on August 11, 2010 10:24 AM  Refills Requested: Medication #1:  TRAMADOL HCL 50 MG TABS Take 1 tablet by mouth four times a day as needed for pain   Last Refilled: 06/22/2010  Method Requested: Electronic Initial call taken by: Merrie Roof RN,  August 11, 2010 10:24 AM  Follow-up for Phone Call       Follow-up by: Blondell Reveal MD,  August 11, 2010 11:04 PM    Prescriptions: TRAMADOL HCL 50 MG TABS (TRAMADOL HCL) Take 1 tablet by mouth four times a day as needed for pain  #45 x 0   Entered and Authorized by:   Blondell Reveal MD   Signed by:   Blondell Reveal MD on 08/11/2010   Method used:   Faxed to ...       [facility] (mail-order)       [address]       [place], [place]  [address]       Ph: [phone]       Fax: [phone]   RxID:   [id number]

## 2010-12-11 NOTE — Miscellaneous (Signed)
Summary: [facility]  [facility]   Imported By: Florinda Marker 01/28/2010 15:15:29  _____________________________________________________________________  External Attachment:    Type:   Image     Comment:   External Document

## 2011-01-13 ENCOUNTER — Inpatient Hospital Stay (INDEPENDENT_AMBULATORY_CARE_PROVIDER_SITE_OTHER)
Admission: RE | Admit: 2011-01-13 | Discharge: 2011-01-13 | Disposition: A | Discharge status: Home / Self Care | Payer: Medicare Other | Source: Ambulatory Visit | Attending: Family Medicine | Admitting: Family Medicine

## 2011-01-13 ENCOUNTER — Telehealth: Payer: Self-pay | Admitting: *Deleted

## 2011-01-13 ENCOUNTER — Ambulatory Visit (INDEPENDENT_AMBULATORY_CARE_PROVIDER_SITE_OTHER): Payer: Medicare Other

## 2011-01-13 DIAGNOSIS — J111 Influenza due to unidentified influenza virus with other respiratory manifestations: Secondary | ICD-10-CM

## 2011-01-13 DIAGNOSIS — J45909 Unspecified asthma, uncomplicated: Secondary | ICD-10-CM

## 2011-01-13 NOTE — Telephone Encounter (Signed)
Pt called for appointment but has decided to go to [facility] now. She will call for f/u appointment after visit

## 2011-01-26 LAB — GLUCOSE, CAPILLARY: Glucose-Capillary: 119 mg/dL — ABNORMAL HIGH (ref 70–99)

## 2011-01-27 LAB — URINALYSIS, ROUTINE W REFLEX MICROSCOPIC
Bilirubin Urine: NEGATIVE
Glucose, UA: NEGATIVE mg/dL
Hgb urine dipstick: NEGATIVE
Ketones, ur: NEGATIVE mg/dL
Protein, ur: NEGATIVE mg/dL

## 2011-01-27 LAB — RAPID URINE DRUG SCREEN, HOSP PERFORMED
Amphetamines: NOT DETECTED
Benzodiazepines: NOT DETECTED

## 2011-01-27 LAB — POCT I-STAT, CHEM 8
Glucose, Bld: 109 mg/dL — ABNORMAL HIGH (ref 70–99)
HCT: 38 % (ref 36.0–46.0)
Hemoglobin: 12.9 g/dL (ref 12.0–15.0)
Potassium: 3.5 mEq/L (ref 3.5–5.1)
Sodium: 141 mEq/L (ref 135–145)

## 2011-01-27 LAB — GLUCOSE, CAPILLARY: Glucose-Capillary: 105 mg/dL — ABNORMAL HIGH (ref 70–99)

## 2011-02-03 ENCOUNTER — Other Ambulatory Visit (INDEPENDENT_AMBULATORY_CARE_PROVIDER_SITE_OTHER): Payer: Medicare Other | Admitting: *Deleted

## 2011-02-03 DIAGNOSIS — F329 Major depressive disorder, single episode, unspecified: Secondary | ICD-10-CM

## 2011-02-03 MED ORDER — CITALOPRAM HYDROBROMIDE 40 MG PO TABS: 40.0000 mg | ORAL_TABLET | Freq: Every day | ORAL | Status: DC

## 2011-02-03 NOTE — Telephone Encounter (Signed)
Hasn't been seen since 4/11. Needs appt. Refill 2 months so that pt has time to get an appt.

## 2011-02-04 NOTE — Telephone Encounter (Signed)
Message sent to [facility] for an appt. 

## 2011-02-08 LAB — POCT I-STAT, CHEM 8
Glucose, Bld: 138 mg/dL — ABNORMAL HIGH (ref 70–99)
HCT: 36 % (ref 36.0–46.0)
Hemoglobin: 12.2 g/dL (ref 12.0–15.0)
Potassium: 3.2 mEq/L — ABNORMAL LOW (ref 3.5–5.1)

## 2011-02-12 LAB — CARDIAC PANEL(CRET KIN+CKTOT+MB+TROPI)
CK, MB: 1 ng/mL (ref 0.3–4.0)
CK, MB: 1.2 ng/mL (ref 0.3–4.0)
Total CK: 102 U/L (ref 7–177)
Total CK: 88 U/L (ref 7–177)

## 2011-02-12 LAB — DIFFERENTIAL
Basophils Absolute: 0 10*3/uL (ref 0.0–0.1)
Basophils Absolute: 0 10*3/uL (ref 0.0–0.1)
Eosinophils Relative: 0 % (ref 0–5)
Lymphocytes Relative: 18 % (ref 12–46)
Lymphocytes Relative: 9 % — ABNORMAL LOW (ref 12–46)
Lymphs Abs: 0.5 10*3/uL — ABNORMAL LOW (ref 0.7–4.0)
Neutro Abs: 4.6 10*3/uL (ref 1.7–7.7)
Neutro Abs: 5.4 10*3/uL (ref 1.7–7.7)
Neutrophils Relative %: 75 % (ref 43–77)
Neutrophils Relative %: 88 % — ABNORMAL HIGH (ref 43–77)

## 2011-02-12 LAB — CBC
HCT: 32.8 % — ABNORMAL LOW (ref 36.0–46.0)
HCT: 34.3 % — ABNORMAL LOW (ref 36.0–46.0)
HCT: 36.3 % (ref 36.0–46.0)
HCT: 36.4 % (ref 36.0–46.0)
HCT: 36.8 % (ref 36.0–46.0)
HCT: 36.6 % (ref 36.0–46.0)
HCT: 37.5 % (ref 36.0–46.0)
Hemoglobin: 11.1 g/dL — ABNORMAL LOW (ref 12.0–15.0)
Hemoglobin: 12.1 g/dL (ref 12.0–15.0)
Hemoglobin: 12.4 g/dL (ref 12.0–15.0)
Hemoglobin: 12.7 g/dL (ref 12.0–15.0)
MCHC: 33.7 g/dL (ref 30.0–36.0)
MCHC: 34 g/dL (ref 30.0–36.0)
MCHC: 34 g/dL (ref 30.0–36.0)
MCV: 89.3 fL (ref 78.0–100.0)
MCV: 89.5 fL (ref 78.0–100.0)
MCV: 89.7 fL (ref 78.0–100.0)
MCV: 90.5 fL (ref 78.0–100.0)
MCV: 88.7 fL (ref 78.0–100.0)
MCV: 89.6 fL (ref 78.0–100.0)
Platelets: 289 10*3/uL (ref 150–400)
Platelets: 297 10*3/uL (ref 150–400)
Platelets: 300 10*3/uL (ref 150–400)
Platelets: 347 10*3/uL (ref 150–400)
Platelets: 378 10*3/uL (ref 150–400)
Platelets: 402 10*3/uL — ABNORMAL HIGH (ref 150–400)
Platelets: 322 10*3/uL (ref 150–400)
RBC: 3.66 MIL/uL — ABNORMAL LOW (ref 3.87–5.11)
RBC: 4.07 MIL/uL (ref 3.87–5.11)
RBC: 4.09 MIL/uL (ref 3.87–5.11)
RDW: 13.6 % (ref 11.5–15.5)
RDW: 13.8 % (ref 11.5–15.5)
RDW: 13.9 % (ref 11.5–15.5)
RDW: 13.6 % (ref 11.5–15.5)
WBC: 10.8 10*3/uL — ABNORMAL HIGH (ref 4.0–10.5)
WBC: 12.6 10*3/uL — ABNORMAL HIGH (ref 4.0–10.5)
WBC: 5.3 10*3/uL (ref 4.0–10.5)
WBC: 7.9 10*3/uL (ref 4.0–10.5)
WBC: 13.9 10*3/uL — ABNORMAL HIGH (ref 4.0–10.5)
WBC: 8.7 10*3/uL (ref 4.0–10.5)

## 2011-02-12 LAB — POCT I-STAT 3, ART BLOOD GAS (G3+)
Bicarbonate: 27.4 mEq/L — ABNORMAL HIGH (ref 20.0–24.0)
pCO2 arterial: 36.4 mmHg (ref 35.0–45.0)
pH, Arterial: 7.484 — ABNORMAL HIGH (ref 7.350–7.400)
pO2, Arterial: 85 mmHg (ref 80.0–100.0)

## 2011-02-12 LAB — BASIC METABOLIC PANEL
BUN: 11 mg/dL (ref 6–23)
BUN: 4 mg/dL — ABNORMAL LOW (ref 6–23)
BUN: 7 mg/dL (ref 6–23)
BUN: 11 mg/dL (ref 6–23)
CO2: 26 mEq/L (ref 19–32)
CO2: 29 mEq/L (ref 19–32)
Calcium: 8.9 mg/dL (ref 8.4–10.5)
Chloride: 101 mEq/L (ref 96–112)
Chloride: 103 mEq/L (ref 96–112)
Chloride: 97 mEq/L (ref 96–112)
Chloride: 100 mEq/L (ref 96–112)
Creatinine, Ser: 0.7 mg/dL (ref 0.4–1.2)
Creatinine, Ser: 0.72 mg/dL (ref 0.4–1.2)
Creatinine, Ser: 0.73 mg/dL (ref 0.4–1.2)
GFR calc Af Amer: >60 mL/min (ref >60)
GFR calc non Af Amer: >60 mL/min (ref >60)
GFR calc non Af Amer: >60 mL/min (ref >60)
GFR calc non Af Amer: >60 mL/min (ref >60)
Glucose, Bld: 108 mg/dL — ABNORMAL HIGH (ref 70–99)
Glucose, Bld: 133 mg/dL — ABNORMAL HIGH (ref 70–99)
Potassium: 3.7 mEq/L (ref 3.5–5.1)
Potassium: 3.7 mEq/L (ref 3.5–5.1)
Potassium: 3.9 mEq/L (ref 3.5–5.1)
Potassium: 3.5 mEq/L (ref 3.5–5.1)
Sodium: 134 mEq/L — ABNORMAL LOW (ref 135–145)
Sodium: 140 mEq/L (ref 135–145)
Sodium: 137 mEq/L (ref 135–145)

## 2011-02-12 LAB — COMPREHENSIVE METABOLIC PANEL
AST: 15 U/L (ref 0–37)
AST: 20 U/L (ref 0–37)
Albumin: 3.5 g/dL (ref 3.5–5.2)
Albumin: 3.7 g/dL (ref 3.5–5.2)
BUN: 5 mg/dL — ABNORMAL LOW (ref 6–23)
Calcium: 8.7 mg/dL (ref 8.4–10.5)
Calcium: 9.3 mg/dL (ref 8.4–10.5)
Creatinine, Ser: 0.71 mg/dL (ref 0.4–1.2)
Creatinine, Ser: 0.83 mg/dL (ref 0.4–1.2)
GFR calc Af Amer: >60 mL/min (ref >60)
GFR calc Af Amer: >60 mL/min (ref >60)
GFR calc non Af Amer: >60 mL/min (ref >60)

## 2011-02-12 LAB — GLUCOSE, CAPILLARY
Glucose-Capillary: 100 mg/dL — ABNORMAL HIGH (ref 70–99)
Glucose-Capillary: 124 mg/dL — ABNORMAL HIGH (ref 70–99)
Glucose-Capillary: 126 mg/dL — ABNORMAL HIGH (ref 70–99)
Glucose-Capillary: 145 mg/dL — ABNORMAL HIGH (ref 70–99)
Glucose-Capillary: 147 mg/dL — ABNORMAL HIGH (ref 70–99)
Glucose-Capillary: 157 mg/dL — ABNORMAL HIGH (ref 70–99)
Glucose-Capillary: 162 mg/dL — ABNORMAL HIGH (ref 70–99)
Glucose-Capillary: 188 mg/dL — ABNORMAL HIGH (ref 70–99)
Glucose-Capillary: 198 mg/dL — ABNORMAL HIGH (ref 70–99)
Glucose-Capillary: 229 mg/dL — ABNORMAL HIGH (ref 70–99)
Glucose-Capillary: 237 mg/dL — ABNORMAL HIGH (ref 70–99)
Glucose-Capillary: 237 mg/dL — ABNORMAL HIGH (ref 70–99)
Glucose-Capillary: 237 mg/dL — ABNORMAL HIGH (ref 70–99)
Glucose-Capillary: 288 mg/dL — ABNORMAL HIGH (ref 70–99)
Glucose-Capillary: 336 mg/dL — ABNORMAL HIGH (ref 70–99)
Glucose-Capillary: 361 mg/dL — ABNORMAL HIGH (ref 70–99)
Glucose-Capillary: 91 mg/dL (ref 70–99)
Glucose-Capillary: 120 mg/dL — ABNORMAL HIGH (ref 70–99)
Glucose-Capillary: 150 mg/dL — ABNORMAL HIGH (ref 70–99)
Glucose-Capillary: 216 mg/dL — ABNORMAL HIGH (ref 70–99)
Glucose-Capillary: 239 mg/dL — ABNORMAL HIGH (ref 70–99)
Glucose-Capillary: 306 mg/dL — ABNORMAL HIGH (ref 70–99)

## 2011-02-12 LAB — LIPID PANEL
HDL: 38 mg/dL — ABNORMAL LOW (ref >39)
LDL Cholesterol: 87 mg/dL (ref 0–99)
Triglycerides: 44 mg/dL (ref <150)
VLDL: 9 mg/dL (ref 0–40)

## 2011-02-12 LAB — CK TOTAL AND CKMB (NOT AT ARMC)
Relative Index: 0.5 (ref 0.0–2.5)
Total CK: 193 U/L — ABNORMAL HIGH (ref 7–177)

## 2011-02-12 LAB — CULTURE, BLOOD (ROUTINE X 2): Culture: NO GROWTH

## 2011-02-12 LAB — BRAIN NATRIURETIC PEPTIDE: Pro B Natriuretic peptide (BNP): 67 pg/mL (ref 0.0–100.0)

## 2011-02-13 LAB — GLUCOSE, CAPILLARY: Glucose-Capillary: 105 mg/dL — ABNORMAL HIGH (ref 70–99)

## 2011-02-14 LAB — GLUCOSE, CAPILLARY
Glucose-Capillary: 140 mg/dL — ABNORMAL HIGH (ref 70–99)
Glucose-Capillary: 115 mg/dL — ABNORMAL HIGH (ref 70–99)
Glucose-Capillary: 165 mg/dL — ABNORMAL HIGH (ref 70–99)

## 2011-02-16 LAB — BASIC METABOLIC PANEL
BUN: 12 mg/dL (ref 6–23)
CO2: 25 mEq/L (ref 19–32)
CO2: 26 mEq/L (ref 19–32)
CO2: 29 mEq/L (ref 19–32)
CO2: 33 mEq/L — ABNORMAL HIGH (ref 19–32)
Calcium: 9.4 mg/dL (ref 8.4–10.5)
Calcium: 9.5 mg/dL (ref 8.4–10.5)
Chloride: 101 mEq/L (ref 96–112)
Chloride: 102 mEq/L (ref 96–112)
Chloride: 96 mEq/L (ref 96–112)
Creatinine, Ser: 0.64 mg/dL (ref 0.4–1.2)
Creatinine, Ser: 0.76 mg/dL (ref 0.4–1.2)
Creatinine, Ser: 0.81 mg/dL (ref 0.4–1.2)
GFR calc Af Amer: >60 mL/min (ref >60)
GFR calc Af Amer: >60 mL/min (ref >60)
GFR calc non Af Amer: >60 mL/min (ref >60)
Glucose, Bld: 162 mg/dL — ABNORMAL HIGH (ref 70–99)
Glucose, Bld: 349 mg/dL — ABNORMAL HIGH (ref 70–99)
Potassium: 3.9 mEq/L (ref 3.5–5.1)
Sodium: 135 mEq/L (ref 135–145)
Sodium: 137 mEq/L (ref 135–145)

## 2011-02-16 LAB — CBC
HCT: 37.2 % (ref 36.0–46.0)
HCT: 37.7 % (ref 36.0–46.0)
Hemoglobin: 12.6 g/dL (ref 12.0–15.0)
Hemoglobin: 12.9 g/dL (ref 12.0–15.0)
MCHC: 33.7 g/dL (ref 30.0–36.0)
MCHC: 33.7 g/dL (ref 30.0–36.0)
MCHC: 33.9 g/dL (ref 30.0–36.0)
MCHC: 34.3 g/dL (ref 30.0–36.0)
MCV: 88.4 fL (ref 78.0–100.0)
MCV: 88.7 fL (ref 78.0–100.0)
MCV: 89.4 fL (ref 78.0–100.0)
Platelets: 312 10*3/uL (ref 150–400)
Platelets: 333 10*3/uL (ref 150–400)
RBC: 4.17 MIL/uL (ref 3.87–5.11)
RBC: 4.26 MIL/uL (ref 3.87–5.11)
RBC: 4.3 MIL/uL (ref 3.87–5.11)
RDW: 13.4 % (ref 11.5–15.5)
RDW: 13.5 % (ref 11.5–15.5)
RDW: 13.6 % (ref 11.5–15.5)
WBC: 14.5 10*3/uL — ABNORMAL HIGH (ref 4.0–10.5)
WBC: 6.2 10*3/uL (ref 4.0–10.5)

## 2011-02-16 LAB — GLUCOSE, CAPILLARY
Glucose-Capillary: 138 mg/dL — ABNORMAL HIGH (ref 70–99)
Glucose-Capillary: 177 mg/dL — ABNORMAL HIGH (ref 70–99)
Glucose-Capillary: 207 mg/dL — ABNORMAL HIGH (ref 70–99)
Glucose-Capillary: 223 mg/dL — ABNORMAL HIGH (ref 70–99)
Glucose-Capillary: 227 mg/dL — ABNORMAL HIGH (ref 70–99)
Glucose-Capillary: 307 mg/dL — ABNORMAL HIGH (ref 70–99)
Glucose-Capillary: 360 mg/dL — ABNORMAL HIGH (ref 70–99)
Glucose-Capillary: 360 mg/dL — ABNORMAL HIGH (ref 70–99)
Glucose-Capillary: 371 mg/dL — ABNORMAL HIGH (ref 70–99)
Glucose-Capillary: 441 mg/dL — ABNORMAL HIGH (ref 70–99)
Glucose-Capillary: 443 mg/dL — ABNORMAL HIGH (ref 70–99)

## 2011-02-16 LAB — DIFFERENTIAL
Basophils Absolute: 0 10*3/uL (ref 0.0–0.1)
Lymphocytes Relative: 19 % (ref 12–46)
Lymphs Abs: 1.2 10*3/uL (ref 0.7–4.0)
Neutro Abs: 4.6 10*3/uL (ref 1.7–7.7)
Neutrophils Relative %: 73 % (ref 43–77)

## 2011-02-16 LAB — POCT I-STAT, CHEM 8
Chloride: 103 mEq/L (ref 96–112)
Creatinine, Ser: 0.6 mg/dL (ref 0.4–1.2)
Glucose, Bld: 163 mg/dL — ABNORMAL HIGH (ref 70–99)
HCT: 43 % (ref 36.0–46.0)
Hemoglobin: 14.6 g/dL (ref 12.0–15.0)
Potassium: 3.7 mEq/L (ref 3.5–5.1)
Sodium: 139 mEq/L (ref 135–145)

## 2011-02-16 LAB — COMPREHENSIVE METABOLIC PANEL
ALT: 14 U/L (ref 0–35)
Albumin: 3.3 g/dL — ABNORMAL LOW (ref 3.5–5.2)
Alkaline Phosphatase: 118 U/L — ABNORMAL HIGH (ref 39–117)
BUN: 8 mg/dL (ref 6–23)
Chloride: 105 mEq/L (ref 96–112)
Glucose, Bld: 262 mg/dL — ABNORMAL HIGH (ref 70–99)
Potassium: 4 mEq/L (ref 3.5–5.1)
Sodium: 140 mEq/L (ref 135–145)
Total Bilirubin: 0.5 mg/dL (ref 0.3–1.2)
Total Protein: 7.2 g/dL (ref 6.0–8.3)

## 2011-02-16 LAB — POCT I-STAT 3, VENOUS BLOOD GAS (G3P V)
Bicarbonate: 26.6 mEq/L — ABNORMAL HIGH (ref 20.0–24.0)
Patient temperature: 37
TCO2: 28 mmol/L (ref 0–100)
pCO2, Ven: 42.1 mmHg — ABNORMAL LOW (ref 45.0–50.0)
pH, Ven: 7.408 — ABNORMAL HIGH (ref 7.250–7.300)

## 2011-02-16 LAB — LIPID PANEL: HDL: 43 mg/dL (ref >39)

## 2011-03-03 ENCOUNTER — Other Ambulatory Visit (INDEPENDENT_AMBULATORY_CARE_PROVIDER_SITE_OTHER): Payer: Medicare Other | Admitting: *Deleted

## 2011-03-03 DIAGNOSIS — E119 Type 2 diabetes mellitus without complications: Secondary | ICD-10-CM

## 2011-03-03 DIAGNOSIS — I10 Essential (primary) hypertension: Secondary | ICD-10-CM

## 2011-03-03 MED ORDER — CARVEDILOL 6.25 MG PO TABS: 6.2500 mg | ORAL_TABLET | Freq: Two times a day (BID) | ORAL | Status: DC

## 2011-03-03 MED ORDER — LISINOPRIL-HYDROCHLOROTHIAZIDE 20-25 MG PO TABS: 1.0000 | ORAL_TABLET | Freq: Every day | ORAL | Status: DC

## 2011-03-03 MED ORDER — GLIPIZIDE ER 5 MG PO TB24: 5.0000 mg | ORAL_TABLET | Freq: Every day | ORAL | Status: DC

## 2011-03-03 MED ORDER — LORATADINE 10 MG PO TABS: 10.0000 mg | ORAL_TABLET | Freq: Every day | ORAL | Status: DC

## 2011-03-09 ENCOUNTER — Encounter (HOSPITAL_COMMUNITY)
Admission: RE | Admit: 2011-03-09 | Discharge: 2011-03-09 | Disposition: A | Discharge status: Home / Self Care | Payer: Medicare Other | Source: Ambulatory Visit | Attending: Orthopedic Surgery | Admitting: Orthopedic Surgery

## 2011-03-09 ENCOUNTER — Other Ambulatory Visit (HOSPITAL_COMMUNITY): Payer: Self-pay | Admitting: Orthopedic Surgery

## 2011-03-09 DIAGNOSIS — M5090 Cervical disc disorder, unspecified, unspecified cervical region: Secondary | ICD-10-CM

## 2011-03-09 LAB — DIFFERENTIAL
Eosinophils Absolute: 0.1 10*3/uL (ref 0.0–0.7)
Eosinophils Relative: 2 % (ref 0–5)
Lymphocytes Relative: 42 % (ref 12–46)
Lymphs Abs: 2.7 10*3/uL (ref 0.7–4.0)
Monocytes Absolute: 0.3 10*3/uL (ref 0.1–1.0)
Monocytes Relative: 5 % (ref 3–12)

## 2011-03-09 LAB — URINALYSIS, ROUTINE W REFLEX MICROSCOPIC
Glucose, UA: NEGATIVE mg/dL
Ketones, ur: 15 mg/dL — AB
Specific Gravity, Urine: 1.027 (ref 1.005–1.030)
pH: 5.5 (ref 5.0–8.0)

## 2011-03-09 LAB — CBC
HCT: 36.2 % (ref 36.0–46.0)
MCH: 29.4 pg (ref 26.0–34.0)
MCV: 87.2 fL (ref 78.0–100.0)
Platelets: 290 10*3/uL (ref 150–400)
RDW: 13.1 % (ref 11.5–15.5)

## 2011-03-09 LAB — ABO/RH: ABO/RH(D): A NEG

## 2011-03-09 LAB — COMPREHENSIVE METABOLIC PANEL
ALT: 8 U/L (ref 0–35)
Albumin: 3.5 g/dL (ref 3.5–5.2)
Alkaline Phosphatase: 99 U/L (ref 39–117)
BUN: 13 mg/dL (ref 6–23)
Chloride: 104 mEq/L (ref 96–112)
Glucose, Bld: 95 mg/dL (ref 70–99)
Potassium: 3.9 mEq/L (ref 3.5–5.1)
Sodium: 141 mEq/L (ref 135–145)
Total Bilirubin: 0.4 mg/dL (ref 0.3–1.2)
Total Protein: 6.8 g/dL (ref 6.0–8.3)

## 2011-03-09 LAB — TYPE AND SCREEN: Antibody Screen: NEGATIVE

## 2011-03-09 LAB — SURGICAL PCR SCREEN: MRSA, PCR: POSITIVE — AB

## 2011-03-11 ENCOUNTER — Inpatient Hospital Stay (HOSPITAL_COMMUNITY)
Admission: RE | Admit: 2011-03-11 | Discharge: 2011-03-12 | DRG: 030 | Disposition: A | Discharge status: Home / Self Care | Payer: Medicare Other | Source: Ambulatory Visit | Attending: Orthopedic Surgery | Admitting: Orthopedic Surgery

## 2011-03-11 ENCOUNTER — Inpatient Hospital Stay (HOSPITAL_COMMUNITY): Payer: Medicare Other

## 2011-03-11 ENCOUNTER — Encounter: Payer: Self-pay | Admitting: Ophthalmology

## 2011-03-11 ENCOUNTER — Ambulatory Visit (HOSPITAL_COMMUNITY): Payer: Medicare Other

## 2011-03-11 DIAGNOSIS — F3289 Other specified depressive episodes: Secondary | ICD-10-CM | POA: Diagnosis not present

## 2011-03-11 DIAGNOSIS — E876 Hypokalemia: Secondary | ICD-10-CM | POA: Diagnosis not present

## 2011-03-11 DIAGNOSIS — E119 Type 2 diabetes mellitus without complications: Secondary | ICD-10-CM | POA: Diagnosis present

## 2011-03-11 DIAGNOSIS — F329 Major depressive disorder, single episode, unspecified: Secondary | ICD-10-CM | POA: Diagnosis not present

## 2011-03-11 DIAGNOSIS — I10 Essential (primary) hypertension: Secondary | ICD-10-CM

## 2011-03-11 DIAGNOSIS — E669 Obesity, unspecified: Secondary | ICD-10-CM | POA: Diagnosis present

## 2011-03-11 DIAGNOSIS — J4489 Other specified chronic obstructive pulmonary disease: Secondary | ICD-10-CM | POA: Diagnosis present

## 2011-03-11 DIAGNOSIS — J449 Chronic obstructive pulmonary disease, unspecified: Secondary | ICD-10-CM | POA: Diagnosis present

## 2011-03-11 DIAGNOSIS — Z01818 Encounter for other preprocedural examination: Secondary | ICD-10-CM

## 2011-03-11 DIAGNOSIS — G4733 Obstructive sleep apnea (adult) (pediatric): Secondary | ICD-10-CM | POA: Diagnosis present

## 2011-03-11 DIAGNOSIS — F17200 Nicotine dependence, unspecified, uncomplicated: Secondary | ICD-10-CM | POA: Diagnosis present

## 2011-03-11 DIAGNOSIS — M5412 Radiculopathy, cervical region: Principal | ICD-10-CM | POA: Diagnosis present

## 2011-03-11 LAB — GLUCOSE, CAPILLARY
Glucose-Capillary: 136 mg/dL — ABNORMAL HIGH (ref 70–99)
Glucose-Capillary: 119 mg/dL — ABNORMAL HIGH (ref 70–99)
Glucose-Capillary: 138 mg/dL — ABNORMAL HIGH (ref 70–99)

## 2011-03-11 NOTE — H&P (Signed)
Hospital Admission Note Date: 03/11/2011  Patient name: [name] Medical record number: [id number] Date of birth: [birth year] Age: 60 y.o. Gender: female PCP: Elyse Jarvis, MD  Medical Service: Internal Medicine Teaching Service.   Attending physician:  Dr. Onalee Hua   Resident (R2/R3): Dr. Arvilla Market    Pager: [phone] Resident (R1): Dr. Cathey Endow        Pager: [phone]  Chief Complaint: Hypertension.   History of Present Illness: This is a 60 year old female with a past medical history significant for COPD, breast cancer status post lumpectomy and radiation therapy in 1991, as well as hypertension, who presents as a consultation for hypertension after C6-C7 ACDF. The patient endorses a 3 month history of neck and left arm pain associated with increased hand weakness, and numbness particularly in the left index finger. The patient's surgery went well without complication, however postop her blood pressure was elevated with systolic blood pressures in the 220s. The patient was having postop pain, so she is given one dose of July the not have a significant effect on her blood pressure. As such, we were consultation for further management. The patient's blood pressure is typically slightly elevated in the clinic with systolic blood pressures between 140 and 160, though she takes her blood pressure medications regularly. During the patient's preoperative evaluation on May 1, her to start blood pressure was 151. Of note, the patient is under significant emotional stress, as she had a difficult time orchestrating the numerous healthcare appointments required for her preoperative evaluation, as well as receiving the devastating news that her son in law was in a severe MVA resulting in the death of the other individual in the accident.   PAST MEDICAL HISTORY: Past Medical History:  1. H/O Acute exacerbation of chronic obstructive pulmonary disease, June 2010,      infectious etiology, to finish full course  therapy with antibiotics       and prednisone taper, clinically resolved.   2. Hypertension, poorly controlled, stopped taking her medications for       more than 1 year, restarting of antihypertensive medications.   3. Hyperlipidemia with an LDL of 190, starting statin therapy.   4. Hyperglycemia most likely secondary from steroid induced versus new       onset type 2 diabetes, A1c 6.9, to follow up with the outpatient       clinic for the possible diagnosis of type 2 diabetes mellitus.   5. Ongoing tobacco abuse.   6. Breast cancer, status post right lumpectomy, status post chemotherapy and       radiation therapy in 1991, mammogram in 2007 is normal.   7. Chronic obstructive pulmonary disease, history of multiple       admissions for exacerbation and multiple emergency department       visits.   8. Bronchial asthma.   9. History of old rib fractures on the right side.   10.History of sigmoid diverticulosis in August 2008, diverticulitis.   11.History of depression/anxiety.   12.History of hyperglycemia secondary to steroids.   13.History of oral thrush secondary to steroids.   14.Obesity.   15.History of chest pain, status post dobutamine Cardiolite showing       inferior ischemia, status post catheterization done in October 2005       showing normal left ventricular systolic function and no       significant coronary artery disease. Pre-operative stress test was indeterminate because they were unable to reach appropriate hr.   16.Gastroesophageal reflux disease.  17.Total abdominal hysterectomy.   MEDICATIONS: Current Meds:  SPIRIVA HANDIHALER 18 MCG CAPS (TIOTROPIUM BROMIDE MONOHYDRATE) 1 capsule inhale once daily (sample only) ALBUTEROL SULFATE (2.5 MG/3ML) 0.083% NEBU (ALBUTEROL SULFATE) use nebulisation every 4-6 hours as needed for shortness or breath CARVEDILOL 6.25 MG TABS (CARVEDILOL) Take 1 tablet by mouth two times a day LISINOPRIL-HYDROCHLOROTHIAZIDE 20-25 MG TABS  (LISINOPRIL-HYDROCHLOROTHIAZIDE) Take 1 tablet by mouth once a day PRAVACHOL 40 MG TABS (PRAVASTATIN SODIUM) Take 1 tablet by mouth once a day at bedtime GLIPIZIDE 5 MG XR24H-TAB (GLIPIZIDE) Take 1 tablet by mouth once a day TRAMADOL HCL 50 MG TABS (TRAMADOL HCL) Take 1 tablet by mouth four times a day as needed for pain CITALOPRAM HYDROBROMIDE 40 MG TABS (CITALOPRAM HYDROBROMIDE) Take 1 tablet by mouth once a day LORATADINE 10 MG TABS (LORATADINE) Take 1 tablet by mouth once a day ADVAIR DISKUS 250-50 MCG/DOSE AEPB (FLUTICASONE-SALMETEROL) Take 1 puff two times a day PRODIGY SAFETY LANCETS 26G  MISC (LANCETS) Use as instructed PRODIGY AUTOCODE BLOOD GLUCOSE  DEVI (BLOOD GLUCOSE MONITORING SUPPL) Use as directed PRODIGY BLOOD GLUCOSE TEST  STRP (GLUCOSE BLOOD) Use as directed  SOCIAL HISTORY: Social History: Lives in [place] with her husband Takes care of 3 grand children. Trying to find a job, but not currently working. 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  Has financial difficulties. Pt currently smokes 1 pack of cigarettes every 3 days, drinks 4-5 alcoholic beverages daily. No illicit drugs.   FAMILY HISTORY Family History: Mom- died in her 32's 2/2 to ovariancancer Dad died at 76 - hepatitis (not sure if Hep C or Hep B) 2 Living brothers - healthy 1 brother died 2/2 hepatitis (?B vs C) and kidney problem 1 Sister died in her 102's - unknown reasons. Was on drugs.   ROS: Negative as per HPI.   VITALS: T: 98.1 P:102  BP:221/108  R:18  O2SAT:96%  ON:RA PHYSICAL EXAM: General:  alert, well-developed, and cooperative to examination.   Head:  normocephalic and atraumatic.   Eyes:  vision grossly intact, pupils equal, pupils round, pupils reactive to light, no injection and anicteric.   Mouth:  pharynx pink and moist, no erythema, and no exudates.   Neck:  Postoperative bandage and brace on neck. Lungs:  normal respiratory effort, no accessory muscle use, normal breath sounds,  no crackles, and no wheezes.  Heart:  normal rate, regular rhythm, 2/6 systolic murmur heard best over left intercostal space, no gallop, and no rub.   Abdomen:  soft, non-tender, normal bowel sounds, no distention, no guarding, no rebound tenderness, no hepatomegaly, and no splenomegaly.   Msk:  no joint swelling, no joint warmth, and no redness over joints.   Pulses:  2+ DP/PT pulses bilaterally Extremities:  No cyanosis, clubbing, edema  Neurologic:  alert & oriented X3, cranial nerves II-XII intact, strength normal in all extremities, sensation intact to light touch, and gait normal.   Skin:  turgor normal and no rashes.   Psych:  Oriented X3, memory intact for recent and remote, normally interactive, good eye contact, depressed appearing.   Lab results: CBC: WBC                                      6.4               4.0-10.5         K/uL  RBC  4.15              3.87-5.11        MIL/uL  Hemoglobin (HGB)                         12.2              12.0-15.0        g/dL  Hematocrit (HCT)                         36.2              36.0-46.0        %  MCV                                      87.2              78.0-100.0       fL  MCH -                                    29.4              26.0-34.0        pg  MCHC                                     33.7              30.0-36.0        g/dL  RDW                                      13.1              11.5-15.5        %  Platelet Count (PLT)                     290               150-400          K/uL  Neutrophils, %                           52                43-77            %  Lymphocytes, %                           42                12-46            %  Monocytes, %                             5                 3-12             %  Eosinophils, %  2                 0-5              %  Basophils, %                             0                 0-1              %  Neutrophils, Absolute                     3.3               1.7-7.7          K/uL  Lymphocytes, Absolute                    2.7               0.7-4.0          K/uL  Monocytes, Absolute                      0.3               0.1-1.0          K/uL  Eosinophils, Absolute                    0.1               0.0-0.7          K/uL  Basophils, Absolute                      0.0               0.0-0.1          K/uL   CMP: Sodium (NA)                              141               135-145          mEq/L  Potassium (K)                            3.9               3.5-5.1          mEq/L  Chloride                                 104               96-112           mEq/L  CO2                                      27                19-32            mEq/L  Glucose  95                70-99            mg/dL  BUN                                      13                6-23             mg/dL  Creatinine                               .87               0.4-1.2          mg/dL  GFR, Est Non African American            >60               >60              mL/min  GFR, Est African American                >60               >60              mL/min    Oversized comment, see footnote  1  Bilirubin, Total                         0.4               0.3-1.2          mg/dL  Alkaline Phosphatase                     99                39-117           U/L  SGOT (AST)                               10                0-37             U/L  SGPT (ALT)                               8                 0-35             U/L  Total  Protein                           6.8               6.0-8.3          g/dL  Albumin-Blood                            3.5               3.5-5.2  g/dL  Calcium                                  9.3               8.4-10.5         Mg/dL  Coag:  Protime ( Prothrombin Time)              12.9              11.6-15.2        seconds  INR                                      0.95               0.00-1.49  Imaging results:   IMPRESSION:   Tip of right internal jugular central venous catheter terminates in   the lower inferior vena cava.  No pneumothorax is evident. There is   minimal basilar atelectasis.  Assessment & Plan by Problem: This is a 60 year old female with a past medical history of breast cancer, and hypertension, for whom we were consulted for postoperative hypertension status post ACDF C6-7.  1.Hypertension: The patient's blood pressure has been elevated in the clinic between systolic 142 systolic 160, she is currently taking carvedilol, lisinopril, and HCTZ. The differential diagnosis for the patient's postoperative hypertension includes, stress reaction, anxiety, and malignant hyperthermia secondary to anesthetic. The latter is extremely unlikely given the patient is afebrile. The most likely etiology of the patient's worsening hypertension at this time, is the combination of recent surgery and secondary postop pain, as well as significant emotional stress, prior to our evaluation, the patient was given 10 mg of hydralazine IV which decreased her blood pressure from 221/108 to 195/96. At this point in time, we'll continue to monitor the patient's blood pressure carefully, and treat wit hydralazine 10 mg IV every 6 hours with the first dose now. Also plan to increase the patient's Coreg to 12.5 mg twice a day as her her in the clinic generally ranges from 80-100's. We'll continue to monitor the patient's pain as well as anxiety and treat accordingly. The patient currently written for morphine 2-4 mg every 3 hours and Xanax 0.5 mg q. 8 hours.  2. Depression: The patient is currently taking Celexa 40 mg daily for her depression which she does not feel has adequately controlled her symptoms. The patient feels that she is depressed, and has associated fatigue throughout the day though this is likely also related to environmental factors. At this point like to increase the patient's  Celexa, and I will have her speak with the chaplain regarding her recent life stressors.  3. Diabetes: In 2010, the patient's A1c was 7.2, and she was started on glipizide. A1c since that time have been under excellent control the last of which was performed 02/2010, and was 5.9. During hospitalization thus far, the patient's CBGs have ranged in the low 100s, she's currently receiving glipizide 5. At this point we will continue current management and continue to monitor her CBCs.  4. COPD: This appears stable the patient denies shortness of breath at this point in time. We'll continue home COPD regimen.

## 2011-03-12 LAB — GLUCOSE, CAPILLARY: Glucose-Capillary: 110 mg/dL — ABNORMAL HIGH (ref 70–99)

## 2011-03-12 LAB — COMPREHENSIVE METABOLIC PANEL
Albumin: 3.2 g/dL — ABNORMAL LOW (ref 3.5–5.2)
Alkaline Phosphatase: 84 U/L (ref 39–117)
BUN: 5 mg/dL — ABNORMAL LOW (ref 6–23)
CO2: 29 mEq/L (ref 19–32)
Chloride: 101 mEq/L (ref 96–112)
Creatinine, Ser: 0.6 mg/dL (ref 0.4–1.2)
GFR calc non Af Amer: >60 mL/min (ref >60)
Glucose, Bld: 142 mg/dL — ABNORMAL HIGH (ref 70–99)
Potassium: 3.4 mEq/L — ABNORMAL LOW (ref 3.5–5.1)
Total Bilirubin: 0.4 mg/dL (ref 0.3–1.2)

## 2011-03-14 NOTE — Op Note (Signed)
[name], TYE            ACCOUNT NO.:  [id number]  MEDICAL RECORD NO.:  [id number]           PATIENT TYPE:  I  LOCATION:  3537                         FACILITY:  [facility]  PHYSICIAN:  Estill Bamberg, MD      DATE OF BIRTH:  1951-05-12  DATE OF PROCEDURE:  03/11/2011 DATE OF DISCHARGE:                              OPERATIVE REPORT   PREOPERATIVE DIAGNOSIS:  Left-sided C7 radiculopathy.  POSTOPERATIVE DIAGNOSIS:  Left-sided C7 radiculopathy.  PROCEDURES: 1. Anterior cervical decompression and fusion, C6-C7. 2. Placement of anterior instrumentation, C6-C7. 3. Use of local autograft. 4. Use of structural allograft. 5. Intraoperative use of fluoroscopy.  SURGEON:  Estill Bamberg, MD  ASSISTANT:  None.  ANESTHESIA:  General endotracheal anesthesia.  COMPLICATIONS:  None.  DISPOSITION:  Stable.  ESTIMATED BLOOD LOSS:  Minimal.  INDICATIONS FOR PROCEDURE:  Briefly, [name] is a very pleasant 60- year-old female who presented to my office with severe debilitating pain in her left arm.  The pain was very much in the distribution of the C7 nerve on the left side.  I did review an MRI which clearly was consistent with degenerative disk disease at the C6-C7 level with left- sided neural foraminal stenosis at that level.  The patient did fail conservative treatment and therefore we did have a discussion regarding going forward with an anterior cervical decompression and fusion at the C6-C7 level.  The patient clearly understood the risks and limitations of the procedure as outlined in my preoperative note.  OPERATIVE DETAILS:  On Mar 11, 2011, the patient was brought to Surgery and general endotracheal anesthesia was administered.  The patient was placed supine on the hospital bed and the neck was placed in a gentle degree of extension.  The shoulders were taped to the inferior aspect of the bed.  I did bring in a lateral fluoroscopic view to help optimize the location  of my incision.  The neck was then prepped and draped in the usual sterile fashion.  A time-out procedure was performed. Antibiotics were given.  I then made a left-sided transverse incision from the midline to the medial border of the sternocleidomastoid muscle. The plane between the carotid sheath laterally and the strap muscles medially was identified and developed.  Of note, the patient did have a history of smoking and he is diabetic and the fascial planes were not readily apparent.  I did, however, meticulously dissected and I was ultimately able to successfully find the plane noted above.  The anterior cervical spine was readily noted.  I turned my attention first towards the C5-C6 interspace.  It was readily apparent that there was very large and very obvious osteophytes noted across the C5-C6 level. This was in line with the patient's preoperative radiographs and this was certainly an unexpected finding.  I then went down one more level down to the C6-C7 level.  I obtain a lateral fluoroscopic view to confirm that this was the appropriate level.  The vertebral bodies of C6 and C7 were then subperiosteally exposed up to the uncovertebral joints bilaterally.  I then removed the anterior osteophytes using a rongeur in addition to  a high-speed bur.  Autograft obtained was collected on the back table.  I then used a #15 blade knife to perform an anterior annulotomy and I performed a preliminary diskectomy, removing approximately the anterior four-fifths of the intervertebral disk.  I then placed Caspar pins into C6 and C7 and gentle distraction was applied.  Of note, a self-retaining Shadow-Line retractor was placed prior to the Caspar pins.  I then used a series of curettes in addition to a 3-mm high-speed bur to remove posterior osteophytes and ultimately the posterior longitudinal ligament.  I used a nerve hook to take down the posterior longitudinal ligament and I then used a #1  and #2 Kerrison to remove posterior osteophytes and the posterolateral longitudinal ligament out to the right uncovertebral joint.  I then did a thorough and complete decompression of the left neural foramen using a #2 Kerrison.  At the termination of the decompression, I was easily able pass a nerve hook out the neural foramen on the left side.  I then used a series of rasps in addition to a 3-mm high-speed bur to gently prepare the endplates of C6 and C7.  I then went forward with placement of trials and I felt that a 7-mm trial will be the most appropriate fit.  I then selected a 7.1 mm allograft for the musculoskeletal transplant foundation and it was hydrated.  I placed the autograft obtained from the endplate preparation above and below the allograft to help optimize the fusion success.  The allograft was then tamped into position uneventfully.  Distraction was then removed and the Caspar pins were then removed as well.  I used bone wax to fill the holes where the Caspar pins were previously placed.  I then went forward with placing the anterior hardware.  I used AP and lateral live intraoperative fluoroscopy to help optimize location of the anterior cervical plate and 16-XW self-drilling and self-tapping screws were placed in the usual fashion in the C6 and C7 vertebral bodies.  I was very happy with the final press-fit of each screw and these screws were noted to be captured by the locking mechanism.  At this point, I copiously irrigated the wound using approximately 300 mL of normal saline.  The platysma and subcutaneous layer was closed using 2-0 Vicryl and the skin was closed using 3-0 Monocryl.  Benzoin and Steri-Strips were applied followed by a sterile dressing.  A hard collar was then placed and the patient was then awakened from general endotracheal anesthesia.  All instrument counts were correct at the termination of the procedure.  The patient was then taken to recovery  in stable condition.     Estill Bamberg, MD     MD/MEDQ  D:  03/11/2011  T:  03/11/2011  Job:  [id number]  cc:   [name], M.D.  Electronically Signed by Estill Bamberg  on 03/14/2011 12:32:33 PM

## 2011-03-14 NOTE — Discharge Summary (Signed)
  [name], [name]            ACCOUNT NO.:  [id number]  MEDICAL RECORD NO.:  [id number]           PATIENT TYPE:  I  LOCATION:  3537                         FACILITY:  [facility]  PHYSICIAN:  Estill Bamberg, MD      DATE OF BIRTH:  1950-12-16  DATE OF ADMISSION:  03/11/2011 DATE OF DISCHARGE:  03/12/2011                              DISCHARGE SUMMARY   ADMISSION DIAGNOSIS:  Left-sided C7 radiculopathy.  DISCHARGE DIAGNOSIS:  Left-sided C7 radiculopathy.  PROCEDURE PERFORMED:  C6-7 anterior cervical diskectomy and fusion using autograft, then allograft, performed on Mar 11, 2011.  ADMISSION HISTORY:  Briefly, Ms. Wisler is a very pleasant 60 year old female who presented to my office with severe debilitating pain in the left arm.  I did review an MRI which clearly was consistent with degenerative disk disease in the C6-7 level in addition to the C5-6 level.  However, the C6-7 level was clearly symptomatic level with obvious neural foraminal stenosis noted.  The patient failed conservative care and therefore, we had a discussion regarding going forward with the procedure noted above.  HOSPITAL COURSE:  The patient was admitted on Mar 11, 2011, for the procedure noted above.  The patient tolerated the procedure well and was transferred to recovery in stable condition.  Hard collar was placed. The patient was neurovascularly intact immediately after her procedure. Of note, I did have a telephone correspondence with the patient's nurse on the evening of her surgery.  The nurse did inform me that the patient's blood pressure was 218/110.  I did ask the nurse to recheck her blood pressure 1 hour later and 1 hour later, her pressure was noted to be 221/108.  Given the patient's high blood pressure, I did contact her primary care physician's office who did recommend that I contact the hospitalist for additional intervention.  The [facility] was contacted and Dr. Onalee Hua was the  attending physician who was involved in the patient's medical care.  Per his recommendation, the patient's Coreg was increased from 6.25 to 12.5 b.i.d.  On the morning of postoperative day #1, the patient's blood pressure was well controlled and was noted to be 126/80.  The patient's pain was well controlled and the patient continued to be neurovascularly intact.  The patient was uneventfully discharged on postop day #1.  DISCHARGE INSTRUCTIONS:  The patient will take Percocet for pain and Valium for spasms.  As noted above, the patient's Coreg was increased from 6.25 mg tabs b.i.d. to 12.5 mg tabs b.i.d.  The patient will follow up with her primary care physician for additional evaluation, and she will follow up with me in approximately 2 weeks after her surgery.  Again, she will wear her hard collar all times, and she was given a [place] collar to be used for showering.     Estill Bamberg, MD     MD/MEDQ  D:  03/12/2011  T:  03/12/2011  Job:  [id number]  cc:   Candyce Churn. Allyne Gee, M.D.  Electronically Signed by Estill Bamberg  on 03/14/2011 12:32:35 PM

## 2011-03-15 ENCOUNTER — Encounter: Payer: Medicare Other | Admitting: Internal Medicine

## 2011-03-23 NOTE — H&P (Signed)
NAMESULMA, [name] [name]            ACCOUNT NO.:  [id number]   MEDICAL RECORD NO.:  [id number]          PATIENT TYPE:  EMS   LOCATION:  MAJO                         FACILITY:  [facility]   PHYSICIAN:  Mobolaji B. Bakare, M.D.DATE OF BIRTH:  [birth year]   DATE OF ADMISSION:  06/25/2007  DATE OF DISCHARGE:                              HISTORY & PHYSICAL   PRIMARY CARE PHYSICIAN:  Unassigned.   CHIEF COMPLAINT:  Abdominal pain.   HISTORY OF PRESENTING COMPLAINT:  [name] [name] is a 60 year old African  American female who was in her usual state of health until about 2 weeks  ago when she developed right lower quadrant pain.  It is crampy in  nature.  It radiates to her suprapubic region, very intense when it  comes on.  It is aggravated by coughing and also when she moves about.  She admits to having constipation.  About 2 days ago, she started having  fever, as noted by increased sweating during the night.  There have been  no chills.  She also developed nausea, vomiting yesterday.  No cough,  chest pain, or difficulty breathing.  She admits to having dysuria and  some urgency.  There is no hematuria.  The patient was seen in the  emergency room.  She had an abdominal x-ray which did not show free air.  She had a CT scan which showed mild sigmoid diverticulitis.  [facility]  has been called to admit for further treatment.   REVIEW OF SYSTEMS:  She denies dizziness.  She has occasional headaches  temporal region bilaterally with no change in her vision.  No shortness  of breath.  She has constipation.  She also endorses pain in her feet.   PAST MEDICAL HISTORY:  1. Asthma.  2. Hypertension.  3. History of depression and anxiety.   PAST SURGICAL HISTORY:  1. Hysterectomy.  2. Lumpectomy, chemotherapy, and radiation for breast cancer in 1991.   CURRENT MEDICATIONS:  1. Hydrochlorothiazide 25 mg daily.  She quit using this two days ago      because of the dysuria and hydrochlorothiazide  makes her urinate a      lot.  2. Albuterol inhaler 2 puffs twice a day.   ALLERGIES:  No known drug allergies.   FAMILY HISTORY:  Significant for breast cancer in her Mom.  Mother  passed away from ovarian cancer at the age of 45.  Father passed away  from complications of kidney disease.  She is not clear about this.  The  patient is married and has 2 daughters.   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  She  gets insurance from the company whenever she is employed and when she is  laid off, she does not.  Because of this the patient is unable to have a  consistent followup with a physician secondary to lack of insurance.  She smokes cigarettes, half a pack a day, and she has been smoking for  about 20 years.  She drinks alcohol over the weekend, a couple of beers.  She never used IV drugs.  She lives  with her husband and independent of  activities of daily living.   PHYSICAL EXAMINATION:  VITAL SIGNS:  Initially on arrival, temperature  100.7, blood pressure 117/75, pulse 130 - this has improved to 107 after  pain medication, respiratory rate was 24 and now 18, O2 sat 98% on room  air.  GENERAL:  The patient is awake, alert, oriented in time, place and  person.  Uncomfortable secondary to lower abdominal pain. Not pale,  anicteric.  NECK:  No elevated JVD.  No palpable thyromegaly.  No carotid bruit.  LUNGS:  Bilateral expiratory rhonchi.  No crackles.  CV:  S1 S2 regular, no murmur.  ABDOMEN:  Not distended, soft.  Surgical scar from hysterectomy noted.  She has marked tenderness in the left lower quadrant and suprapubic  region with some rebound tenderness but no guarding.  Bowel sounds  present.  EXTREMITIES:  No pedal edema or calf tenderness.  Dorsalis pedis pulses  palpable bilaterally.  CNS:  No focal neurological deficit.   INITIAL LABORATORY DATA:  Urinalysis showed white cells 0-2, bacteria  few, positive nitrite, protein 30, urobilinogen  1, pH 6, specific  gravity 1.008.  Sodium 140, potassium 3.2, chloride 106, bicarb 25,  glucose 101, BUN 6, creatinine 0.91, bilirubin 1.3.  Alkaline  phosphatase 97, AST 11, ALT 9, total protein 6.7, albumin 3.3, calcium  8.7.  White cells 11.1, hemoglobin 13.6, hematocrit 39.3, platelets 388,  neutrophils 80%, lymphocytes 16%.  Abdominal x-ray showed no acute  findings.  No free air.  CT scan of the abdomen and pelvis showed mild  sigmoid diverticulitis, no abscess.   ASSESSMENT:  [name] [name] is a 60 year old African American female  presenting with lower quadrant pain, fever, and leukocytosis, some  urinary symptoms but no pyuria on microscopy, although, she has a  positive nitrite.  CT scan of the abdomen confirmed mild sigmoid  diverticulitis without abscess.  She does have significant tenderness on  abdominal examination.  Abdominal x-ray did not show any evidence of  perforation.  The patient will be admitted for further treatment.   PLAN:  1. Acute sigmoid diverticulitis.  We will keep the patient NPO, IV      fluid normal saline at 125 cc/hr, IV ciprofloxacin 400 mg IV q.12      h., Flagyl 500 mg IV q.8 h., Dilaudid 0.5 to 1 mg IV q.4 h. p.r.n.,      Phenergan 12.5 mg IV q.4 h. p.r.n.  We will also use Zofran if      Phenergan is not effective.  Blood cultures will be obtained.  2. Hypokalemia.  We will give 2 runs of potassium chloride and add      potassium supplement to the IV fluid.  3. History of asthma.  The patient is not in acute exacerbation.  We      will give p.r.n. albuterol.  Given the lung sounds at this point,      we will give a dose of nebulization now.  4. Hypertension.  Resume hydrochlorothiazide 25 mg daily.  5. Tobacco abuse.  Nicotine patch 14 mg daily.  We will offer tobacco      cessation counseling.  6. GI prophylaxis with Protonix IV and DVT prophylaxis with Lovenox.      Mobolaji B. Corky Downs, M.D.  Electronically Signed    MBB/MEDQ  D:   06/25/2007  T:  06/25/2007  Job:  [id number]

## 2011-03-23 NOTE — Discharge Summary (Signed)
[name], [name]            ACCOUNT NO.:  [id number]   MEDICAL RECORD NO.:  [id number]          PATIENT TYPE:  INP   LOCATION:  5705                         FACILITY:  [facility]   PHYSICIAN:  Mobolaji B. Bakare, M.D.DATE OF BIRTH:  [birth year]   DATE OF ADMISSION:  06/25/2007  DATE OF DISCHARGE:  06/30/2007                               DISCHARGE SUMMARY   PRIMARY CARE PHYSICIAN:  Unassigned.   FINAL DIAGNOSES:  1. Acute sigmoid diverticulitis.  2. Hypertension.  3. History of asthma controlled.  4. Tobacco abuse.   PROCEDURES:  CT scan of the abdomen and pelvis done on June 25, 2007,  showed mild sigmoid colon diverticulitis, no abscess.  Followup CT  abdomen and pelvis done on June 28, 2007, showed improvement in acute  sigmoid diverticulitis, no abscess.   BRIEF HISTORY:  Please refer to the admission H&P.  In brief, Christie  [name] presented with lower abdominal pain which she had for about 2  weeks with fever, leukocytosis poor, p.o. intake, nausea and vomiting.  She had a CT scan of the abdomen and pelvis on admission, which showed  acute sigmoid diverticulitis.  She had leukocytosis of [address].  Patient  was subsequently admitted for IV antibiotics.   HOSPITAL COURSE:  1. Active sigmoid diverticulitis.  She was kept NPO for the night.      Patient felt hungry the next day.  She was started on clear liquids      which she tolerated.  This was advanced gradually.  At the moment      she is tolerating low residue diet.  2. She responded well to IV antibiotics with initial escalation of      fever.  Subsequently, on June 28, 2007, she had a low-grade fever      of 100.6 with some increased tenderness on abdominal examination.      She underwent a repeat CT scan of abdomen and pelvis which showed      radiological improvement in acute sigmoid diverticulitis without      any abscess.  Patient continued to improve.  She has been able to      move her bowels.  There is no  hematochezia.  She should be ready      for discharge on June 30, 2007.  IV antibiotic has been      transitioned to orally.  3. Hypertension.  Patient does have history of hypertension, and she      uses hydrochlorothiazide.  Blood pressure was not controlled during      the course of hospitalization, rising up to 160 systolic and      lisinopril 5 mg was added to her medication.  Blood pressure is      currently better controlled around 130 systolic.  4. Tobacco abuse.  Patient is continuing quitting smoking.  She was      started on nicotine patch.  She will continue with this.  She has      been off her tobacco cessation counseling.  5. History of asthma.  This was stable during this hospitalization.      She  will continue albuterol as needed.  6. Hyperkalemia.  This was repleted.  Potassium at the time of      dictation was 3.9.   DISCHARGE LABORATORY DATA:  White cell 5.7, hemoglobin 12.2, platelets  376, sodium 139, potassium 3.9, chloride 101, bicarbonate 53, BUN 3,  creatinine 0.63, glucose 109, calcium 9.3.   DISCHARGE CONDITION:  Patient is stable for discharge on June 30, 2007.  She remains afebrile.   DISCHARGE MEDICATIONS:  1. Albuterol puff p.r.n.  2. Hydrochlorothiazide 25 mg daily.  3. Ciprofloxacin 500 mg b.i.d. for 6 more days.  4. Flagyl 500 mg three times a day for 6 more day.  5. Lisinopril 5 mg daily.  6. Vicodin 1 to 2 q.4 to 6 hours p.r.n. pain.  7. Nicotine patch 14 mg daily.   FOLLOWUP:  With primary care physician at urgent care.      Mobolaji B. Corky Downs, M.D.  Electronically Signed     MBB/MEDQ  D:  06/29/2007  T:  06/29/2007  Job:  [id number]

## 2011-03-23 NOTE — Discharge Summary (Signed)
Christie [name], [name]            ACCOUNT NO.:  [id number]   MEDICAL RECORD NO.:  [id number]          PATIENT TYPE:  INP   LOCATION:  5511                         FACILITY:  [facility]   PHYSICIAN:  Manning Charity, MD     DATE OF BIRTH:  09-20-1951   DATE OF ADMISSION:  10/12/2007  DATE OF DISCHARGE:  10/17/2007                               DISCHARGE SUMMARY   DISCHARGE DIAGNOSES:  1. Asthma exacerbation.  2. Hypertension.  3. Right sided rib pain.  4. Hypokalemia.  5. History of right breast lumpectomy with chemo and radiation, 1991.  6. History of hysterectomy.  7. Tobacco abuse.   The patient being discharged on the following medications:  1. Albuterol MVI two puffs q.4-6 h., as needed for shortness of      breath.  2. Advair Diskus 500/50 mg one puff twice a day.  3. Prednisone slow taper 60 mg for two days, 50 mg for two days, 40 mg      for two days, 30 mg for two days, 20 mg for two days, 10 mg for two      days and then stop.  4. Hydrochlorothiazide 25 mg one tablet daily.  5. Lisinopril 20 mg one tablet daily.  6. Mucinex 200 mg q.4 h., as needed for cough.  7. Benzonatate 200 mg one tablet 3 times a day for cough.   Patient being discharged in stable condition to be followed up at the  [facility] outpatient clinic with Dr. Radonna Ricker on December 18, at 2:30 p.m.  At that time, a BMET should be checked to look for hypokalemia.   Procedures performed during this hospitalization include:  October 16, 2007, chest x-ray two views shows status post right axillary  dissection, chest hyperexpanded consistent with history of chronic  obstructive pulmonary disease, lungs clear, no pleural effusion, heart  size normal, remote rib fractures noted on the right.  Impression:  Emphysema without acute disease.   There were no consultation during this hospitalization.   Admitting H&P:  Christie [name] is a 60 year old African American female with a history of  asthma.  She has never been  intubated.  She presents to the emergency  department with flu-like illness for the past three weeks and  progressive shortness of breath.  She described symptoms as nasal  congestion, blocking sensation of ears, nasal discharge, mostly non-  productive cough, sometimes producing a white to yellow sputum, no  blood.  She took Sudafed and Mucinex which provided no relief.  For her  progressive shortness of breath, she has increased her albuterol inhaler  use but ultimately ran out four weeks before admission.  She has been  using an albuterol nebulizer sparingly since she cannot have the  medications refilled secondary to finances.  She also has been using her  grandson's Advair without relief.  She ultimately came to seek medical  attention at [facility].  Also she is supposed to be on a blood pressure  medication but has not taken this for months.  In the emergency room,  the patient was given continuous albuterol nebulizers for one hour,  dexamethasone 10 mg IV, enalapril 1.25 mg IV x1, Clonidine 0.2 mg x1 and  magnesium sulfate 2 mg x1.   On admission, temperature 99.0, blood pressure 207/136, pulse 131,  respirations 30, satting 95% on room air.   Physcial Exam:  GENERAL: AOx3. NAD. Patient lying upright in bed, speaking in complete  sentences.  HEENT: The oropharynx is clear.  Neck supple with no lymphadenopathy  LUNGS: Decreased air movement, bilateral expiratory wheezes, peak  expiratory flow, postbronchodilation is 350 mL.  CV: Tachycardic, regular rhythm, no murmurs, rubs or gallops.  AOZ:HYQMVH.  EXT: There is no edema in the extremities. No rashes.  NEURO: nonfocal.   LABS:  Sodium 137, potassium 4.5, chloride 107, bicarbonate 28, BUN 12,  creatinine 0.7, glucose 122, white blood cell count 7900, hemoglobin  14.6, hematocrit 42.8, platelets 346,000, MCV is 87.8.  Liver panel  within normal limits.  Insulins A and B negative.  Urinalysis shows only  small leukocyte  esterase.   HOSPITAL COURSE:  1. Asthma exacerbation:  The patient was placed on IV Solu-Medrol for      four days then switched to p.o. prednisone.  She received albuterol      and Atrovent neb treatments q.6 h., while in hospital.  Her      respiratory status slowly improved to the point where she was      saturating 100% on room air and was able to ambulate in the hallway      without distress.  She was discharged on p.o. prednisone with an      Advair Diskus in hand and prescription for albuterol inhaler.  Feel      that this exacerbation is secondary to lack of medications.  She      will be following up in the [facility] outpatient clinic and her      long-term asthma management can be managed in an outpatient      setting.  2. Hypertension:  On presentation, blood pressure was 200/136.  The      patient was placed on hydrochlorothiazide 25 and Lisinopril 20.  By      the day of discharge, the blood pressure had only come down to      159/88.  This can be managed on an outpatient setting.  It should      be re-evaluated when she presents to [facility] outpatient clinic.  3. Tobacco use:  Tobacco cessation consult was performed in hospital.      The patient agreed to attempt to quit smoking.  This should be      pursued in the outpatient setting.  4. Right rib pain:  Most likely secondary to old fractures.  The      patient was treated in the hospital with Vicodin.  By the time of      discharge was no longer needing medication to control her pain.      She was discharged without any medication for pain.  5. Hypokalemia:  On the day of discharge, potassium was 3.5.  This was      repeated before discharge from the hospital.  This should be re-      evaluated with a BMET at her next outpatient appointment.   On the day of discharge, the patient was afebrile, blood pressure  159/88, pulse 71, respirations 20, the patient satting 99% on room air.  Labs:  Sodium 138, potassium 3.5,  chloride 95, bicarbonate 34, BUN 8,  creatinine 0.6, glucose  137, white blood cell count 15.3, most likely  secondary to steroid administration in the hospital.  Hemoglobin 13.3,  hematocrit 39.3, platelets 312,000.      Elby Showers, MD  Electronically Signed      Manning Charity, MD  Electronically Signed    CW/MEDQ  D:  10/18/2007  T:  10/19/2007  Job:  [id number]   cc:   Marinda Elk, M.D.

## 2011-03-23 NOTE — Discharge Summary (Signed)
NAMEBRIAR, SWORD            ACCOUNT NO.:  [id number]   MEDICAL RECORD NO.:  [id number]          PATIENT TYPE:  INP   LOCATION:  5041                         FACILITY:  [facility]   PHYSICIAN:  Madaline Guthrie, M.D.    DATE OF BIRTH:  Aug 10, 1951   DATE OF ADMISSION:  04/02/2009  DATE OF DISCHARGE:  04/06/2009                               DISCHARGE SUMMARY   DISCHARGE DIAGNOSES:  1. Acute exacerbation of chronic obstructive pulmonary disease,      infectious etiology, to finish full course therapy with antibiotics      and prednisone taper, clinically resolved.  2. Hypertension, poorly controlled, stopped taking her medications for      more than 1 year, restarting of antihypertensive medications.  3. Hyperlipidemia with an LDL of 190, starting statin therapy.  4. Hyperglycemia most likely secondary from steroid induced versus new      onset type 2 diabetes, A1c 6.9, to follow up with the outpatient      clinic for the possible diagnosis of type 2 diabetes mellitus.  5. Ongoing tobacco abuse.  6. Right-sided musculoskeletal chest pain, resolving, history of      breast cancer, status post lumpectomy, status post chemotherapy and      radiation therapy in 1991, mammogram in 2007 is normal.  7. Chronic obstructive pulmonary disease, history of multiple      admissions for exacerbation and multiple emergency department      visits.  8. Bronchial asthma.  9. History of old rib fractures on the right side.  10.History of sigmoid diverticulosis in August 2008, diverticulitis.  11.History of depression/anxiety.  12.History of hyperglycemia secondary to steroids.  13.History of oral thrush secondary to steroids.  14.Obesity.  15.History of chest pain, status post dobutamine Cardiolite showing      inferior ischemia, status post catheterization done in October 2005      showing normal left ventricular systolic function and no      significant coronary artery disease.  16.Gastroesophageal  reflux disease.  17.Total abdominal hysterectomy.   DISCHARGE MEDICATIONS:  1. Avelox 400 mg 1 pill once a day for 4 more days.  2. Prednisone 10 mg 5 pills on Apr 07, 2009, 4 pills on April 08, 2009,      3 pills on April 09, 2009, 2 pills on April 10, 2009, 1 pill on April 13, 2009.  3. Spiriva 18 mcg 1 capsule inhaled once daily.  4. Albuterol sulfate 0.5% nebulizer 1 puff 1 nebulization every 4      hours as needed for shortness of breath and wheezing.  5. Carvedilol 6.25 mg 1 pill twice a day.  6. Lisinopril-hydrochlorothiazide combination 20/25 one  pill once a      day.  7. Pravachol 40 mg 1 pill once a day at bedtime.  8. Tramadol 50 mg 1 pill 4 times a day as needed for pain.  9. Triamcinolone 0.1% ointment apply in the affected areas as      directed.  10.Benzonatate 100 mg 1 pill 3 times daily as needed for cough.   DISPOSITION AND  FOLLOWUP:  The patient is to follow up with the  Outpatient Clinic [facility] in 2 weeks.  The outpatient clinic  is to call with an appointment in 2 weeks.  At the time of hospital  followup, please address the patient's breathing and also make sure that  the patient finished full course of antibiotic therapy as well as  prednisone therapy.  Given the patient's history of medication  noncompliance in the past secondary to financial situations, please make  sure that the patient is receiving all the medications.   PROCEDURE PERFORMED:  Chest x-ray, findings compatible with emphysema,  no acute process.   LABORATORY DATA:  ABG, pH 7.4, PCO2 42.  Bicarb 26.6, TSH is 0.43, A1c  6.9.  Fasting lipid profile, total cholesterol 250, LDL cholesterol 190,  HDL cholesterol 43.   CONSULTATIONS:  None.   BRIEF ADMITTING HISTORY AND PHYSICAL:  The patient is a 60 year old lady  with past medical history of COPD with multiple hospital admissions for  COPD exacerbations, ongoing tobacco abuse, hypertension, history of  breast cancer, status post  lumpectomy, chemotherapy, radiation therapy  in 1991, history of lack of insurance, comes to the emergency with chief  complaints of shortness of breath, cough that started nearly 2 weeks  ago.  The patient's complaints started 2 weeks ago with cold, runny nose  when she had contact with her sick grandkids who also had cold.  Since  then, she has been complaining of progressively worsening shortness of  breath and cough and has been using albuterol even now and then which  she ran out 3-4 days ago.  Since then, she has been getting  progressively worse.  She denies any fevers, chills, myalgias,  complaining of productive cough with greenish color sputum, but no blood  in it.  Also complains of right-sided chest pain increased by coughing  and deep breath.  She reports not taking her other medicines also for  more than 1 year.  She denies any abdominal pain, nausea, vomiting,  diarrhea, constipation, or any other complaints.   PHYSICAL EXAMINATION:  VITAL SIGNS:  Temperature 97.2, blood pressure  209/103, pulse rate of 116, respiratory rate 24, oxygen saturation 99%  on room air.  GENERAL:  The patient was in distress secondary to shortness of breath,  but appears calm.  BiPAP mask is on and speaks in full sentences.  HEENT:  Pupils equal, round, and reacting to light.  ENT, clear  oropharynx.  No tonsillomegaly.  NECK:  Supple.  No carotid bruit.  RESPIRATORY:  Bilaterally diffuse expiratory wheezing.  No crackles.  CARDIOVASCULAR:  No JVD.  Tachycardia noted.  Regular in rhythm.  No  murmurs.  No rubs.  No gallops.  ABDOMEN:  Soft, obese, nondistended, and nontender.  Bowel sounds  positive.  EXTREMITIES:  No pedal edema.  Brownish spots on lower extremities.  SKIN:  Moist.  NEUROLOGICAL:  Nonfocal.  PSYCH:  Appropriate.   LABORATORY DATA ON ADMISSION:  CBC, white count 6.2, hemoglobin 13.6,  platelet count 333.  Comprehensive metabolic panel, sodium 140,  potassium 4.0, chloride  105, bicarb 25, glucose 262, BUN 8, creatinine  0.65, total bilirubin 0.5, alkaline phosphatase 118, AST 20, ALT 14,  total protein 7.2, albumin 3.3, calcium 9.2.   HOSPITAL COURSE BY PROBLEM:  1. Acute exacerbation of COPD.  The patient was initially admitted to      step-down unit and was initially started on BiPAP.  We started her  on IV Solu-Medrol along with IV antibiotics.  The patient's      breathing has gotten better during the remaining part of her      hospital stay and was saturating 95-100% on 2 L on the first day of      hospitalization and then was satting 95-100% on room air.  The      patient is to finish full course of prednisone therapy as well as      antibiotics.  Given her bad COPD, we also started her on Spiriva.      The patient is to follow up with the Outpatient Clinic [facility].  Given her financial situations and lack of insurance, we      provided most of her discharge medications from the hospital and      also tried to prescribe most of the medications from the [facility] 4-      dollar prescriptions.  The patient on the day of discharge is      breathing on room air and is satting 98-100% on room air.  2. Hypertension.  The patient's initial blood pressures in the      emergency were more than 200 and we started her on lisinopril-      hydrochlorothiazide combination.  Given her persistent tachycardia      as well as persistently elevated systolic blood pressure, we also      started her on Coreg and gradually increased to 6.25 b.i.d. dose.      The patient's blood pressure at the time of discharge was 130/73.      The patient is to take all the medications as mentioned in the      discharge instructions and is to follow up with the outpatient      clinic.  3. Hyperglycemia.  The patient does have a history of hyperglycemia      secondary to steroid induce during the prior hospitalization in      2008.  The patient's A1c is 6.9 during  this admission.  The patient      is to follow up with the Outpatient Clinic [facility] in 2      weeks and is to recheck her fasting blood sugar values.  If the      fasting blood sugars are consistently greater than 126, the patient      is to be started on metformin therapy.  4. Right-sided chest pain most likely secondary to the musculoskeletal      pain.  We treated her musculoskeletal chest pain with Vicodin      during the initial 2 days of her hospital admission.  The patient's      chest pain has gotten better and has not been complaining on the      day of discharge.  The patient is to take the tramadol as needed      for chest pain and is to follow up with the Outpatient Clinic [facility].   DISCHARGE VITAL SIGNS:  Temperature 99.0, pulse rate of 75, respiratory  rate 16, blood pressure 130/73, oxygen saturation 96% on 2 L.   CBC, white count 13.8, hemoglobin 12.9, hematocrit 38.4, platelet count  326.  Basic metabolic panel, sodium 138, potassium 3.8, chloride 95,  bicarb 33, glucose 187, BUN 14, creatinine 0.81, calcium is 9.4.   The patient on the day of discharge is alert and oriented  x3,  asymptomatic, and is not in any acute distress.  The patient is to take  all the medications mentioned in the discharge instructions and is to  follow up with the Outpatient Clinic [facility].      Blondell Reveal, MD  Electronically Signed      Madaline Guthrie, M.D.  Electronically Signed    VB/MEDQ  D:  04/06/2009  T:  04/07/2009  Job:  [id number]   cc:   Outpatient [facility]

## 2011-03-26 NOTE — H&P (Signed)
[place]  Patient:    [name], [name]                   MRN: [id number] Adm. Date:  [id number] Attending:  Cathren Laine                         History and Physical  CHIEF COMPLAINT:  Dyspnea.  HISTORY OF PRESENT ILLNESS:  A 60 year old female with a long history of asthma and allergies presents with a two to three-week history of increased clear nasal congestion, a 48-hour history of increased chest tightness.  By the time patient presented, she was unable to lie flat and was having spasming cough.  Her cough is nonproductive.  No fevers.  Positive post nasal drip wit nasal congestion, clear.  Patient continues to smoke a half pack per day.  No headache or pressure.  Patient wanted to finish her week at work, therefore did not present earlier.  Patient had run out of Claritin D, did not feel Rhinocort worked as well as the [employer].  PAST MEDICAL HISTORY:  Asthma, poor dentition, tobacco use, depression, breast cancer status post chemotherapy and radiation in 1991.  PAST SURGICAL HISTORY:  Right lumpectomy and hysterectomy - she is uncertain if they did an oophorectomy.  CURRENT MEDICATIONS: 1. Advair 100/50 one inhalation b.i.d. 2. Albuterol p.r.n. 3. Singulair 10 mg q.p.m. 4. Claritin D - she has run out of. 5. Rhinocort AQ one spray each nostril each morning.  ALLERGIES:  No known drug allergies.  SOCIAL HISTORY:  Patient is married, has two adult children, three grandchildren.  Works in [employer] work at ______.  Occasional alcohol, no drug use.  FAMILY HISTORY:  Positive for two grandsons with asthma, mother with breast cancer, mother with diabetes.  No coronary artery disease.  REVIEW OF SYSTEMS:  Positive for extremely poor dentition.  Otherwise, is negative in all system groups except continued symptoms of depression.  PHYSICAL EXAMINATION:  VITAL SIGNS:  Temperature 99, blood pressure 179/99, respirations 20, pulse 98,  pulse oximetry 100% on room air.  GENERAL:  Well-developed, well-nourished black female, moderate dyspnea with talking and spasmodic cough.  HEENT:  Tympanic membranes clear.  Pupils are equal, round and reactive to light.  Oropharynx extremely poor dentition.  NECK:  Supple, no lymphadenopathy, no thyromegaly.  CARDIAC:  Regular rate and rhythm without murmur, S3, or S4.  LUNGS:  Bilateral wheezes and rales and decreased air movement bilaterally.  ABDOMEN:  Soft, nondistended, nontender.  Positive bowel sounds.  BREASTS:  No masses palpated and no discharge.  PELVIC:  Deferred as patient is status post hysterectomy.  EXTREMITIES:  No edema, no cyanosis.  NEUROLOGIC:  Cranial nerves 2-12 intact.  Strength 5/5 bilateral upper extremities and lower extremities.  Reflexes 2+ and symmetric.  CNS within normal limits.  LABORATORY DATA:  Chest x-ray shows peribronchial thickening.  ASSESSMENT:  A 60 year old female with acute asthma exacerbation secondary to allergies and noncompliance with medications and continued tobacco use.  At this point, no evidence of sinusitis.  Patient remains dyspneic after nebulizers in the emergency room, therefore requires admission.  PLAN:  Admit for 24-hour observation, albuterol and Atrovent nebulizers, prednisone.  Restart Claritin D and Flonase, Singulair.  Encourage tobacco cessation and restart antidepressant. DD:  01/23/01 TD:  01/23/01 Job: [id number]

## 2011-03-26 NOTE — H&P (Signed)
[facility]  Patient:    [name], [name] Visit Number: [id number] MRN: [id number]          Service Type: MED Location: 3W [id number] Attending Physician:  Katy Apo. Dictated by:   Renford Dills, M.D. Admit Date:  12/20/2001                           History and Physical  CHIEF COMPLAINT:  Cough, wheezing.  HISTORY OF PRESENT ILLNESS:  [name] 60 year old black female with history of asthma x five years, tobacco use half pack [name] day, history of breast carcinoma, status post lumpectomy in 1991 without recurrence presents to the hospital for admission after evaluation on an outpatient basis by primary M.D.  At that time the patient was evaluated for symptoms of cough productive of colored sputum, malaise and low grade temp.  The patient empirically on an outpatient basis with antibiotics, steroids, and nebulizers.  However, due to financial reasons, the patient was unable to start her medications in [name] timely fashion. On revisit to the M.D.s office, the patient was slightly more dyspneic with temperature and admission was deemed necessary for further evaluation.  After discussion with the patient she admits to having symptoms x 1 week of [name] cold where she felt achy and had pain as if she had the flu.  That was followed by [name] cough productive of sputum.  She does admit to having [name] sick contact at work.  The patient does admit to smoking.  Denies any sinus pressure, congestion or post nasal drip.  As stated, the patient was unable to get her medicines secondary to financial reasons.  She does admit to having wheezing and worsening shortness of breath.  No chest pain or palpitations.  Admission was deemed necessary for further evaluation.  PAST MEDICAL HISTORY:  As stated above.  MEDICATIONS ON ADMISSION: 1. Claritin. 2. Advair 500/50. 3. Albuterol MDI. 4. Singulair 10 mg one daily. 5. Nasal spray, the patient is unsure of. 6. Zoloft 50 mg  daily.  SOCIAL HISTORY:  Positive for tobacco, half pack [name] day x greater than 30 years.  Social alcohol.  No drugs.  PAST SURGICAL HISTORY:  Significant for lumpectomy in 1991, hysterectomy in 1980 secondary to fibroids.  The patient denies any appendectomy or cholecystectomy.  ALLERGIES:  LEVAQUIN which caused diarrhea.  REVIEW OF SYSTEMS:  As stated in HPI.  FAMILY HISTORY:  Mother - status post mastectomy for malignancy.  Father is deceased questionable from hepatitis.  The patient has three brothers, one of which is deceased from complicated hospitalization for pneumonia and [name] sister that is deceased of unknown cause.  PHYSICAL EXAMINATION:  GENERAL:  The patient is alert and oriented x 3.  Mild distress, however, no accessory muscle use.  HEENT:  Anicteric sclera.  No oral lesions.  No nodes.  The patient does have poor dentition.  NECK:  The patients thyroid is slightly prominent.  No nodules can be palpated.  CHEST:  Diffuse, long wheeze bilateral.  No rales.  CARDIOVASCULAR:  Regular S1, S2.  No S3.  ABDOMEN:  Soft, nontender.  No hepatosplenomegaly.  There is [name] midline surgical scar, well-healed.  EXTREMITIES:  No clubbing, cyanosis or edema.  Pulses 2+.  RECTAL EXAM:  Deferred.  BREASTS:  Exam is deferred.  NEUROLOGICAL EXAM:  Grossly intact, nonfocal.  DIAGNOSTIC DATA:  Pending at the time of this dictation.  ASSESSMENT AND PLAN: 1. Chronic obstructive pulmonary  disease, asthmatic exacerbation in known    smoker now with low grade temperature and productive cough.  The patient    will be admitted to [name] medicine floor.  She will be started on IV steroids,    IV antibiotics, will obtain [name] chest x-ray.  Will start her on nebulizer    treatments and check peak-flows pre and post nebulizer treatment. It was    also recommended that the patient stop smoking. 2. History of breast carcinoma diagnosed in 1991 without signs of recurrence.    I recommend that  patient follow-up with annual mammogram. 3. Tobacco abuse.  Recommended that patient stop now. Dictated by:   Renford Dills, M.D. Attending Physician:  Renford Dills D. DD:  12/20/01 TD:  12/20/01 Job: [id number]

## 2011-03-26 NOTE — Discharge Summary (Signed)
NAME:  [name], [name]                      ACCOUNT NO.:  [id number]   MEDICAL RECORD NO.:  [id number]                   PATIENT TYPE:  INP   LOCATION:  3031                                 FACILITY:  [facility]   PHYSICIAN:  Corinna L. Lendell Caprice, MD             DATE OF BIRTH:  1950-12-02   DATE OF ADMISSION:  07/22/2003  DATE OF DISCHARGE:  07/25/2003                                 DISCHARGE SUMMARY   DISCHARGE DIAGNOSES:  1. Acute exacerbation of chronic bronchitis.  2. Continued tobacco abuse.  3. Gastroesophageal reflux disease.  4. Depression.  5. Hypertension.  6. History of breast cancer.   DISCHARGE MEDICATIONS:  1. Singulair 10 mg p.o. daily.  2. Hydrochlorothiazide 12.5 mg p.o. daily.  3. Advair 250/50 one puff b.i.d.  4. Zoloft 50 mg p.o. q.d.  5. Combivent two puffs q.4h. p.r.n.  6. Albuterol and Atrovent handheld nebulizer q.4h. p.r.n.  7. Omeprazole 20 mg p.o. q.d.  8. Flonase two sprays per nostril q.d. as needed.  9. Prednisone taper.   DIET:  Low salt.   ACTIVITY:  Ad-lib. She is to stop smoking. She is to follow up with Dr.  Laurann Montana in two weeks.   PERTINENT LABORATORY DATA:  On admission, her pH was 7.397, pCO2 41, pO2 77.  There is no indication of what oxygen she was on, if any. CBC was normal.  Basic metabolic panel normal. UA negative. Special studies and radiology:  Chest x-ray showed no infiltrate.   HISTORY AND HOSPITAL COURSE:  Ms. [name] is [name] 60 year old white female who  has [name] history of COPD and continued tobacco abuse who presented to the  emergency room with shortness of breath and wheezing. She also had [name] cough  productive of yellow sputum. She had just been discharged three weeks before  but continued to smoke. She did not improve in the emergency room and was  therefore admitted to the floor, given oxygen, several days of Avelox, IV  steroids which were tapered, handheld nebulizers, Advair. She also  complained of sinus  congestion and headache for which Flovent was started  and Allegra D. During her hospitalization, she complained of severe  heartburn which improved on Protonix. Given the repeat nature of her  admissions, I have attempted to set up [name]  nebulizer machine with Atrovent and albuterol. The case worker is currently  managing this. I have also referred her from indigent medications. I have  impressed upon her the importance of quitting smoking. Total time on the day  of discharge is 40 minutes.                                                Corinna L. Lendell Caprice, MD    CLS/MEDQ  D:  07/25/2003  T:  07/27/2003  Job:  [id number]

## 2011-03-26 NOTE — Discharge Summary (Signed)
[name], [name]            ACCOUNT NO.:  [id number]   MEDICAL RECORD NO.:  [id number]          PATIENT TYPE:  INP   LOCATION:  5015                         FACILITY:  [facility]   PHYSICIAN:  Jonna L. Robb Matar, M.D.DATE OF BIRTH:  August 01, 1951   DATE OF ADMISSION:  03/08/2006  DATE OF DISCHARGE:  03/17/2006                                 DISCHARGE SUMMARY   ADDENDUM:   PRIMARY CARE PHYSICIAN:  Unassigned.   PULMONOLOGIST:  Dr. Delford Field.   FINAL DIAGNOSES:  1.  Chronic obstructive pulmonary disease exacerbation.  2.  Type 2 diabetes secondary to steroids.  3.  Hypertension.  4.  Depression.  5.  Anxiety.  6.  Persistent cough.  7.  Oral thrush.   HOSPITAL COURSE:  The patient continued to have problems with wheezing and  cough productive of yellowish-green sputum and the patient ran persistently  high glucoses, requiring Lantus, but absolutely refuses to use insulin at  home.   The patient was seen by Pulmonary and switched to long-acting medications.   DISPOSITION:  The patient will be discharged on:  1.  Advair 500/50 b.i.d.  2.  Spiriva 18 mcg daily.  3.  Singulair 10 mg daily.  4.  Prednisone 20 mg b.i.d.  5.  Humibid two pills b.i.d.  6.  Avelox 400 mg daily for a week.  7.  She will be on omeprazole 20 mg q.a.m. and nightly.  8.  Citalopram 20 mg daily.  9.  I will try her on Amaryl 4 mg daily and metformin 500 mg b.i.d. for her      glucoses.  I suspect that this will not bring her sugars under 200, but      as noted above, the patient refuses to use insulin at home.   FOLLOWUP:  She has an appointment to see Dr. Shan Levans on April 11, 2006  at 2:30 p.m.  She can use albuterol every 4 hours as an extra inhaler if  needed.      Jonna L. Robb Matar, M.D.  Electronically Signed     JLB/MEDQ  D:  03/17/2006  T:  03/18/2006  Job:  [id number]   cc:   Shan Levans, M.D. [facility]  [address]  [place]  [place] [address]

## 2011-03-26 NOTE — H&P (Signed)
NAMELARAYA, PESTKA            ACCOUNT NO.:  [id number]   MEDICAL RECORD NO.:  [id number]          PATIENT TYPE:  EMS   LOCATION:  MAJO                         FACILITY:  [facility]   PHYSICIAN:  Sherin Quarry, MD      DATE OF BIRTH:  [birth year]   DATE OF ADMISSION:  08/25/2004  DATE OF DISCHARGE:                                HISTORY & PHYSICAL   [name] is a 60 year old lady who unfortunately receives no ongoing  medical care.  She was last hospitalized by our group in September 2004 and  apparently has not seen a doctor since then.  She continues to smoke about  one-half to one pack of cigarettes per day.  She has not been taking any  type of nebulizer or metered dose inhaler treatment on ongoing basis.  On  October 9, she presented to [facility] with persistent cough and  wheezing.  She was given steroids and nebulizer treatments at that time and  was discharged but unfortunately has not improved.  She has continued to  experience coughing productive of foamy sputum, moderate shortness of  breath, and particularly has been having difficulty because of inability to  sleep at night secondary to shortness of breath.  She returns to the  emergency room at this time, and Dr. Ethelda Chick appropriately feels that it  would be best to admit her to the hospital for further evaluation and  treatment.  She denies any recent history of fever, chest pain, vomiting, or  diarrhea.   PAST MEDICAL HISTORY:   ALLERGIES:  No known drug allergies.   CURRENT MEDICATIONS:  The patient is taking Singulair 10 mg daily.  It is  not clear to me how she is getting this medication.  She states that she has  currently no access to any other medications.   OPERATIONS:  She had a previous lumpectomy.  She has also had a previous  hysterectomy.   MEDICAL ILLNESSES:  The patient was diagnosed with breast cancer in 1991  which was treated with chemotherapy and radiation therapy.   FAMILY  HISTORY:  The patient's mother had a history of breast cancer and  diabetes.  She has two brothers who are drug addicts.  She has a sister who  died of unknown causes and a brother who died of renal failure.   SOCIAL HISTORY:  The patient is married.  She has two adult children.  She  drinks alcohol occasionally.  She denies use of drugs.  She smokes about one-  half pack of cigarettes per day.   REVIEW OF SYSTEMS:  HEAD:  She denies headache or dizziness.  EYES:  She  denies visual blurring or diplopia.  EAR, NOSE, THROAT:  Denies earaches,  sinus pain, or sore throat. CHEST:  See above.  CARDIOVASCULAR:  See above.  GI:  The patient is currently somewhat nauseated.  She has not been  vomiting.  She denies abdominal pain, change in bowel habits, melena, or  hematochezia. GU:  Denies dysuria or urinary frequency.  RHEUMATOLOGIC:  Denies back pain or joint pain.  HEMATOLOGIC:  Denies easy  bleeding or  bruising.  NEUROLOGIC:  Denies seizure or stroke.   PHYSICAL EXAMINATION:  HEENT:  Within normal limits.  CHEST:  Diminished breath sounds.  There are scattered rhonchi.  There are  diffuse expiratory wheezes.  CARDIOVASCULAR:  Normal S1 and S2.  There are no rubs, murmurs, or gallops.  ABDOMEN:  Benign.  Minimal bowel sounds without masses or tenderness.  No  guarding or rebound.  NEUROLOGIC:  Normal.  EXTREMITIES:  Normal.   IMPRESSION:  1.  Asthma unresponsive to outpatient therapy.  2.  Inadequate medical care.  3.  Chronic obstructive pulmonary disease with 50-pack-year smoking history.  4.  Chronic bronchitis.  5.  History of breast cancer.  6.  Status post hysterectomy.  7.  Depression.   PLAN:  The patient will be admitted at this time for standard treatment  including intravenous fluids, oxygenation, antibiotic therapy, intravenous  steroids, and anti-nausea medication will be prescribed.  Clearly the key  issue, however, with this patent is to try to arrange some type of  ongoing  outpatient followup.       SY/MEDQ  D:  08/25/2004  T:  08/25/2004  Job:  [id number]   cc:   Stacie Acres. White, M.D.  [address]  [place]  [place] [address]  Fax: [phone]

## 2011-03-26 NOTE — Discharge Summary (Signed)
NAMEARIADNA, [name]            ACCOUNT NO.:  [id number]   MEDICAL RECORD NO.:  [id number]          PATIENT TYPE:  INP   LOCATION:  3733                         FACILITY:  [facility]   PHYSICIAN:  Isidor Holts, M.D.  DATE OF BIRTH:  1951/05/04   DATE OF ADMISSION:  12/25/2005  DATE OF DISCHARGE:  12/31/2005                                 DISCHARGE SUMMARY   DISCHARGE DIAGNOSES:  1.  Acute bronchitis.  2.  Infective exacerbation of chronic obstructive pulmonary      disease/bronchial asthma, secondary to acute bronchitis.  3.  Musculoskeletal right-sided chest pain (Old right rib fractures noted on      chest x-ray).  4.  Hypertension.  5.  Smoking history.  6.  Anxiety/Depression.  7.  Steroid-induced diabetes mellitus.  8.  History of carcinoma right breast; status post      lumpectomy/chemotherapy/radiotherapy in 1991, now status post mammogram      December 29, 2005.   DISCHARGE MEDICATIONS:  1.  Albuterol inhaler 2 puffs p.r.n.  2.  Advair Diskus (250/50) 1 puff b.i.d.  3.  Singulair 10 mg p.o. daily.  4.  Avelox 400 mg p.o. daily for 5 days only.  5.  Nicoderm CQ patch (14 mg/24 hours) 1 patch to skin daily.  6.  Cardizem CD 240 mg p.o. daily.  7.  Prednisone 30 mg p.o. daily for 2 days then 20 mg p.o. daily for 3 days      then 10 mg daily for 3 days then stop.  8.  Metformin 500 mg p.o. b.i.d. for 1 week only.  9.  Lexapro 10 mg p.o. daily.  10. Albuterol 2.5 mg/Atrovent 500 mcg nebulizer p.r.n. q.4 to 6 hourly.   PROCEDURES:  1.  Chest x-ray dated December 26, 2005. This showed borderline heart size      and chronic bronchitis, chronic obstructive pulmonary disease, also old      right-sided rib fractures, no focal opacities or effusions.  2.  Breast mammogram dated December 23, 2005 at [facility]. This showed lumpectomy changes on the right. Prior exams,      however, not available. Addendum, will be made available with  recommendations, after comparison is made.   CONSULTATIONS:  None.   ADMISSION HISTORY:  See H&P notes of December 09, 2005 by Dr. Elliot Cousin,  M.D. However, in brief this is a 60 year old female, with known history of  COPD/Bronchial asthma, status post frequent admissions for exacerbations of  same, hypertension, a smoker, history of right breast cancer, status post  lumpectomy, chemotherapy/radiation in 1991, history of depression and  steroid-induced hyperglycemia, who presents with increasing shortness of  breath, cough and wheeze. Cough was productive of yellow phlegm. Duration of  symptoms approximately 1 week. The patient was admitted for further  evaluation and investigation and management.   CLINICAL COURSE:  1.  Acute bronchitis. The patient presents with a 1-week history of cough,      productive of yellowish phlegm, subjective fever, increased shortness of      breath and wheeze. Chest x-ray showed  no focal consolidation, but showed      COPD changes and bronchitis. She was managed as a case of acute      bronchitis with Avelox with satisfactory clinical response.   1.  Exacerbation of COPD/Bronchial asthma secondary to acute bronchitis. The      patient presents with symptoms, as described above. It was clear that      she had an infective exacerbation of her obstructive airways disease.      The patient states that she does have home bronchodilator nebulizer      machine, however does not have adequate finances to buy the nebulizer      solution and therefore, she usually reports to the emergency room during      the course of exacerbations. She was managed with steroid treatment,      bronchodilator nebulizers, and oxygen supplementation, with good      clinical effect. As of December 31, 2005, she had improved so much so,      that she was placed on a tapering course of steroids, which it is      expected that she will complete in approximately 1 week.   1.   Right-sided musculoskeletal chest pain. Chest x-ray demonstrated old rib      fractures, against a background of COPD exacerbation.  It is likely that      the patient had developed musculoskeletal pain secondary to this,      however, she does have a history of right breast carcinoma, status post      lumpectomy, chemotherapy and radiation treatment in 1991. The patient      states that chest pain apparently anteceded this admission by      approximately 1 month or so. Because of these concerns, a mammogram was      arranged, which was carried out on December 29, 2005 at [facility]. Preliminary report showed lumpectomy changes on the      right, however, formal report folows,  following comparison with prior      mammograms. The patient states that she had a mammogram last in May      2006.   1.  Anxiety/depression. The patient was managed for this, with Lexapro with      good clinical response. we have discharged her on this medication.   1.  Hypertension. This was adequately controlled with Cardizem CD.   1.  Steroid-induced diabetes mellitus. The patient was found to be      hyperglycemic, necessitating management with scheduled Lantus,      carbohydrate-modified diet, and sliding scale insulin coverage. This      was, of course, secondary to steroids. Hb A1c dated December 29, 2005      was 6.2 indicating euglycemia, certainly in the preceding 3 months. The      patient has, therefore, been switched to Glucophage, which we expect her      to utilize for 1 week only, as we do not expect this condition to be      ongoing. We are pleased to announce that as of 8 p.m., December 31, 2005, CBG was entirely reasonable at 111.   1.  Smoking. The patient smokes about 1/2 pack of cigarettes per day. She      was consulted appropriately and commenced on Nicoderm CT patch. She     appears determined to quit and we have encouraged her  accordingly.    DISPOSITION:  The patient was discharged in satisfactory condition on  December 31, 2005. She is recommended to return to work on January 06, 2006.   DIET:  Regular.   ACTIVITY:  As tolerated.   WOUND CARE:  Not applicable.   PAIN MANAGEMENT:  Not applicable.   FOLLOW-UP INSTRUCTIONS:  The patient has been recommended to establish a  primary M.D. at [facility]. While an inpatient, she has been consulted by  social worker/case management because of her financial situation. Efforts  have been made to arrange an appointment for her at [facility] within 1  week.      Isidor Holts, M.D.  Electronically Signed     CO/MEDQ  D:  12/31/2005  T:  12/31/2005  Job:  [id number]   cc:   [facility]

## 2011-03-26 NOTE — Discharge Summary (Signed)
[name], [name]                      ACCOUNT NO.:  [id number]   MEDICAL RECORD NO.:  [id number]                   PATIENT TYPE:  INP   LOCATION:  5025                                 FACILITY:  [facility]   PHYSICIAN:  Jackie Plum, M.D.             DATE OF BIRTH:  1951-09-03   DATE OF ADMISSION:  06/28/2003  DATE OF DISCHARGE:  07/02/2003                                 DISCHARGE SUMMARY   DISCHARGE DIAGNOSES:  1. Chronic obstructive pulmonary disease exacerbation secondary to     sinobronchitis.  2. Sinobronchitis resolved.  3. History of hypertension, depression and anxiety, breast cancer status     post lumpectomy.  4. Also history of cigarette smoking.   DISCHARGE LABORATORIES:  WBC count 37.4, hemoglobin 13.4, hematocrit 38.7,  MCV 92.8, platelet count 292, sodium 139, potassium 4, chloride 107, CO2 20,  glucose 227, BUN 7, creatinine 0.8, calcium 9.7.   DISCHARGE MEDICATIONS:  Prednisone taper, Zoloft 50 once daily, Flovent two  puffs b.i.d., Combivent two puffs q.i.d., Singulair 10 mg once daily,  hydrochlorothiazide 12.5 mg p.o. once daily.   ACTIVITY:  As tolerated.   DIET:  Low salt diet.   SPECIAL INSTRUCTIONS:  The patient has been advised to stop cigarette  smoking.  She is to report to MD for any problems.  Followup appointment  will be with Dr. Charlott Rakes in 1 week.  The patient is also to call for  appointment to see Robie Mcniel at mental health.   REASON FOR HOSPITALIZATION:  COPD exacerbation secondary to sinobronchitis.   HISTORY OF PRESENT ILLNESS:  The patient presented with a 4- to 5-day  history of shortness of breath.  She had been in her usual state of health  until about 2 weeks prior to admission when she started to experience  frontal headaches, sneezing, rhinorrhea, which is clear, and ultimately  postnasal drip.  This was followed later on by a cough productive of yellow  sputum and generalized weakness with fever and progressively  worsening  shortness of breath.  There was no history of chest pain, palpitations,  paroxysmal nocturnal dyspnea, orthopnea, frequency or micturition, dysuria,  or bright red blood per rectum.  There is no history of loss of  consciousness.  She had also been having some problem sleeping.  At the ED  the patient was given several doses of nebulization without any improvement  with still complaints of chest tightness and shortness of breath and  therefore we will admit.   Admitting BP was 157/97 with a heart rate of 105 per minute and respiratory  rate of 22, O2 saturation of 97% on 2 L of oxygen.  She was afebrile.  She  had a clear nasal discharge with some mild erythema of the nares.  There was  no JVD.  Lung exam was notable for decreased breath sounds with expiratory  wheezes and few rhonchi.  Cardiac exam was notable for tachycardia  which was  mild without any gallops or murmurs.  Extremity exam was negative for any  edema.  She was alert/oriented x3.   I-STAT was unremarkable and x-ray showed COPD without acute infiltrate.   The patient was therefore admitted for management of COPD exacerbation  secondary to sinobronchitis with postnasal drip.   HOSPITAL COURSE:  The patient was initially admitted to telemetry bed which  was subsequently discontinued.  She was started on IV Solu-Medrol with  nebulization treatment, she received oxygen and fluid supplementation.  She  was given Claritin-D 12-hour for her sinusitis.  With these measures the  patient's respiratory status improved significantly over the course of her  hospitalization and her O2 saturations on room air have been anywhere  between 94-100% day before discharge.  The patient remained afebrile during  hospitalization and her repeat x-ray did not show any acute infiltrates.  She is therefore being discharged home for further management as mentioned  above.   __________  Not applicable.   PROCEDURES:  Not  applicable.   CONSULTANTS:  Not applicable.   __________  Not applicable.   CONDITION ON DISCHARGE:  Improved and satisfactory.   DISPOSITION:  The patient is going home with family.                                                Jackie Plum, M.D.    GO/MEDQ  D:  07/02/2003  T:  07/03/2003  Job:  [id number]   cc:   Stacie Acres. White, M.D.  [address]  [place]  [place] [address]  Fax: [phone]

## 2011-03-26 NOTE — Discharge Summary (Signed)
NAMESHAKITA, [name]                      ACCOUNT NO.:  [id number]   MEDICAL RECORD NO.:  [id number]                   PATIENT TYPE:  INP   LOCATION:  5511                                 FACILITY:  [facility]   PHYSICIAN:  Carman Ching, MS4                DATE OF BIRTH:  1951-01-02   DATE OF ADMISSION:  08/09/2002  DATE OF DISCHARGE:  08/13/2002                                 DISCHARGE SUMMARY   DISCHARGE DIAGNOSES:  1. Asthma, chronic obstructive pulmonary disease exacerbation.  2. Tobacco  abuse.  3. Gastroesophageal reflux disease.  4. Depression.  5. Thyromegaly.  6. High blood pressure, borderline.  7. History of breast cancer, status post mastectomy, chemo and radiation in     1991.  8. Status post hysterectomy.   DISCHARGE MEDICATIONS:  1. Atrovent MDI 1 inhaler 2 puffs q.i.d.  2. Albuterol MDI 1 inhaler 2 puffs q.i.d.  3. Protonix 40 mg 1 p.o. q.d.  4. Wellbutrin 150 mg 1 p.o. q.d.  5. Prednisone taper, 20 mg 3 pills p.o. q.d.  x 3 days, then 2 pills p.o.     q.d.  x  3 days then 1 pill p.o. q.d.  x 3 days then half tablet p.o.     q.d.  x  3 days.   FOLLOW UP:  The patient is to follow up at the  internal medicine clinic at  [facility] on August 20, 2002,  at 3 p.m.  At this time the  patient's need for scheduling of pulmonary function tests may be evaluated.   PROCEDURES:  None.   CONSULTS:  None.   HISTORY OF PRESENT ILLNESS:  The patient is a 60 year old black female with  a past medical history significant for asthma, tobacco abuse, PND and GERD,  who presented to the ER on August 09, 2002, at 1800, with complaints of  a  six day history of congestion, clear rhinorrhea, post nasal drip and cough  that progressed to increased dyspnea, chills, night sweats, fevers and  increased PND. 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  Additionally because of her unemployment, the patient  has run out of her medications secondary to loss of insurance.   On review of systems, the patient denies chest pain but states that she gets  sore with coughing and wheezing. She admits to positive ill contacts with a  grandson with a cold and another grandson with a strep throat in the last  couple weeks.   In the emergency room the patient was  found to be anxious and tearful and  in mild respiratory distress, and it was decided that she be admitted to the  floor for further workup and evaluation. In the ER the patient received  several nebulized breathing treatments  as well as steroids and supplemental  oxygen as needed.   PHYSICAL EXAMINATION:  VITAL SIGNS:  Pulse 117, blood pressure  183/93,  temperature 99.2, respiratory rate 22, O2 saturation 98% on room air.  GENERAL:  The patient was  anxious, tearful,  well developed, well nourished  black female in mild respiratory distress.  HEENT:  PERRLA, sclerae anicteric, EOMI. Oropharynx and nares with slight  erythema and clear discharge.  NECK:  Positive thyromegaly, however, no lymphadenopathy appreciated.  RESPIRATORY:  Diffuse expiratory wheezes throughout with decreased air  movement.  CARDIOVASCULAR:  Tachycardic, regular rhythm, no murmurs, rubs, gallops.  GI:  Obese, soft, nontender, nondistended,  normoactive bowel sounds, no  rebound or guarding.  EXTREMITIES:  No cyanosis, clubbing or edema, 2+ pulses intact.  SKIN:  No rashes or lesions noted.  MUSCULOSKELETAL:  5/5 strength grossly throughout.  NEUROLOGIC:  Cranial nerves II through XII grossly intact.  PSYCHOLOGIC:  Alert and oriented x 4. The patient tearful.   LABORATORY DATA:  Admission labs include a complete metabolic panel with  sodium of 139, potassium 3.4, chloride 106, bicarbonate 24, BUN 9,  creatinine 0.8  and glucose 204. Bilirubin 0.6, alkaline phosphatase 91,  SGOT 23, SGPT 15, albumin 3.6, calcium  9.4. A CBC with differential had  white blood cells 6.7, hemoglobin 13.3, hematocrit 38.9, platelets 304, ANC  6.0,  MCV 84.7 with a differential of neutrophils 90, lymphocytes 9,  monocytes 1.   Studies include EKG showing normal sinus rhythm with a rate of 82, PR 150,  QRS 92, QTC 424 and no ischemic changes.  Chest x-ray showed hyperinflation  as well as chronic changes and no infiltrate.   HOSPITAL COURSE BY ACTIVE PROBLEM:  PROBLEM #1, ASTHMA, CHRONIC OBSTRUCTIVE  PULMONARY DISEASE EXACERBATION:  The patient was  continued on nebulizer  treatments, steroids and p.r.n. oxygen. Her course continued to improve  throughout her hospital stay and she will be discharged today with  medications as described above.   PROBLEM #2, TOBACCO ABUSE:  Encouraged cessation. Talked about different  cessation aids.   PROBLEM #3, GASTROESOPHAGEAL REFLUX DISEASE:  Continue Protonix.   PROBLEM #4, HIGH BLOOD PRESSURE, BORDERLINE:  The patient reports that blood  pressure is higher during hospital and clinic visits. Would encourage her to  get some  outside readings  and we can consider an antihypertensive should  her blood pressure not improve.   PROBLEM #5, THYROMEGALY:  We can followup thyromegaly and its workup in the  clinic as an outpatient.   PROBLEM #6, DEPRESSION:  Continue Wellbutrin.    CONDITION ON DISCHARGE:  Discharge laboratory results:  Sputum showed rare  white blood cell present,  predominantly neutrophils, rare squamous  epithelial cells present, abundant Gram positive cocci in pairs and in  clusters and chains, few Gram negative rods, few yeast, rare Gram negative  cocci in pairs.  A CBC showed WBC count of 10.1, hemoglobin 12.8, hematocrit  38.8 and platelets 297, MCV 90.1.                                               Carman Ching, MS4    KB/MEDQ  D:  08/13/2002  T:  08/15/2002  Job:  [id number]   cc:   Blanch Media, MD   [facility] Clinic

## 2011-03-26 NOTE — Consult Note (Signed)
[name], [name]            ACCOUNT NO.:  [id number]   MEDICAL RECORD NO.:  [id number]          PATIENT TYPE:  INP   LOCATION:  5015                         FACILITY:  [facility]   PHYSICIAN:  Shan Levans, M.D. LHCDATE OF BIRTH:  October 24, 1951   DATE OF CONSULTATION:  03/16/2006  DATE OF DISCHARGE:                                   CONSULTATION   CHIEF COMPLAINT:  Respiratory distress.   HISTORY OF PRESENT ILLNESS:  A 60 year old black female who has had multiple  admissions with COPD recently, just now readmitted on Mar 08, 2006, with  another COPD exacerbation, previous history of depression, bronchitis,  hypertension, anxiety, active smoking, carcinoma of the right breast, right-  sided musculoskeletal pain.   PAST OPERATIVE HISTORY:  Hysterectomy.   ALLERGIES:  NONE.   CURRENT MEDICATIONS:  1.  While here in the hospital includes nebulized albuterol and Atrovent      q.4h.  2.  Advair 500/50 one spray b.i.d.  3.  Catapres b.i.d.  4.  Norvasc 10 mg daily.  5.  Prednisone 40 mg b.i.d.  6.  Pepcid 20 mg b.i.d.  7.  Tussionex b.i.d. for cough.  8.  Lantus 36 units h.s., 20 units a.m.  9.  Rocephin 2 g daily.   PAST MEDICAL HISTORY:  As noted above.   SOCIAL HISTORY:  Actively smoking.  She does occasionally drink.  Goes to  [employer].   FAMILY HISTORY:  Mother died at age 38 from cervical cancer.  Otherwise,  noncontributory.   PHYSICAL EXAMINATION:  GENERAL:  This is an obese African-American female in  no distress.  VITAL SIGNS:  Temp 98, blood pressure 140/76, pulse 86, saturation 96% on 2  L, respirations 16.  CHEST:  Showed expiratory wheeze with distant breath sounds.  CARDIAC:  Showed a regular rate and rhythm, without S3, normal S1, S2.  ABDOMEN:  Soft, nontender.  EXTREMITIES:  Showed edema or clubbing.  SKIN:  Clear.  NEUROLOGIC:  Intact.  HEENT:  Showed no jugular venous distention, lymphadenopathy, oropharynx  clear.  NECK:  Supple.   LABORATORY  DATA:  Chest x-ray shows COPD changes only.  No active  cardiopulmonary disease.  Sodium 137, potassium 3.9, chloride 103, CO2 28,  BUN 15, creatinine 0.8.  White count 11,000, hemoglobin 12.7.   IMPRESSION:  1.  Chronic obstructive lung disease, acute exacerbation.  2.  Vocal cord dysfunction.  3.  Upper airway instability.  4.  Reflux disease.  5.  Obesity.   RECOMMENDATIONS:  1.  Discontinue Atrovent, begin Spiriva daily.  2.  Continue Advair 500/50 one spray b.i.d.  3.  Taper prednisone slowly, going down to 60 mg a day, tapering down by 10      mg every 4 days until off.  4.  Begin a flutter valve 4 times a day, taper oxygen to off.  5.  Discontinue Atrovent, adjust neb treatments.      Shan Levans, M.D. [facility]  Electronically Signed     PW/MEDQ  D:  03/16/2006  T:  03/17/2006  Job:  [id number]   cc:   Miachel Roux L. Robb Matar,  M.D.   [facility]

## 2011-03-26 NOTE — Consult Note (Signed)
Christie, [name]            ACCOUNT NO.:  [id number]   MEDICAL RECORD NO.:  [id number]          PATIENT TYPE:  INP   LOCATION:  5703                         FACILITY:  [facility]   PHYSICIAN:  Zola Button T. Lazarus Salines, M.D. DATE OF BIRTH:  Sep 03, 1951   DATE OF CONSULTATION:  DATE OF DISCHARGE:                                   CONSULTATION   PREOPERATIVE DIAGNOSES:  1.  Pituitary tumor.  2.  Left deviated nasal septum with obstruction.   POSTOPERATIVE DIAGNOSES:  1.  Pituitary tumor.  2.  Left deviated nasal septum with obstruction.   PROCEDURE PERFORMED:  1.  Otolaryngology:  Transnasal, transseptal, transsphenoidal approach for      hypophysectomy.  2.  Incidental nasal septoplasty.   SURGEON:  [name]. Lazarus Salines, M.D.   ANESTHESIA:  General orotracheal.   BLOOD LOSS:  Minimal.   COMPLICATIONS:  None.   FINDINGS:  A severely buckled leftward-deviated septum with obstruction.  Clear sphenoid sinuses with the intersinus septum slightly off the midline  towards the right.  Slight softening and thinning of the bone of the  anterior face/floor of the sella with no visible tumor.   PROCEDURE:  With the patient in a comfortable supine position, general  orotracheal anesthesia was induced without difficulty.  At an appropriate  level, Dr. Newell Coral positioned the patient on a horseshoe head holder and  positioned the C-arm appropriately for intraoperative visualization.  After  Dr. Newell Coral had positioned the patient adequately, I assumed care.  A  saline-moistened throat pack was placed.  Nasal vibrissae were trimmed.  Cocaine crystals, 200 mg total, were applied on cotton carriers to the  anterior ethmoid and sphenopalatine ganglion regions on both sides.  Cocaine  solution, 160 mg total, was applied on 1/2 x 3 inch cottonoids to both sides  of the nasal septum.  The 1% Xylocaine with 1:100,000 epinephrine, 17 cc's  total, was infiltrated into the root of the columella, into the  nasal spine  region, into the anterior floor of the nose on both sides, into the  membranous columella on both sides, and into the submucoperichondrial plane  of the septum on both sides.  Several minutes were allowed for hemostasis  and anesthesia to take effect.  A sterile preparation and draping of the  midface and the right lower quadrant of the abdomen were performed.   The materials were removed from the nose and observed to be intact and  correct in number.  The findings were as described above.  A small left-  sided floor incision was executed.  A right-sided hemitransfixion incision  was executed and carried down to a floor incision.  Floor tunnels were  elevated on both sides and brought up to the vomer and brought forward.  The  submucoperichondrial plane of the right side of the septum was elevated up  to the dorsum of the nose, back onto the perpendicular plate, carefully  removed from the buckled area, and communicated with the floor tunnel.  The  right septal flap was raised intact.  The chondroethmoid junction was  identified and lysed with a Risk analyst.  The opposite submucoperiosteal  plane of the septum then was elevated.  The superior perpendicular plate was  lysed with an open Jansen-Middleton forceps to avoid rocking in this region.  The inferior portion of the perpendicular plate was dissected free and  rocked loose and delivered with the closed Jansen-Middleton forceps.  The  posterior tail of the quadrangular cartilage was dissected and removed.  The  maxillary crest posterior to the quadrangular cartilage was also loose and  was removed.  The septum was freed from the maxillary crest anteriorly.  A 2-  mm strip of the inferior quadrangular cartilage was resected to allow the  septum to trapdoor into the midline.  The posteroinferior corner of the  cartilage was submucosally resected, leaving a 2-cm dorsal and caudal strut.  There was a buckle in the septum,  which was addressed later.   The septum was replaced in the midline, and the columnella region was  cleaned and dried.  An incision was marked at the base of the columella with  a central V and then sharply executed, dissecting the soft tissues beneath  the medial crurae of the lower lateral cartilages.  The columella was  transected.  The columellar incision was communicated with a hemitransfixion  incision on the right side and with the floor incision on the left side, and  the soft tissues around the nasal spine were dissected; and the septum and  columella were swung to the left side and the septal flap to the right side.  A small amount of soft tissue on the maxillary crest was dissected and  removed.  At this point, the [facility] retractor was placed against the face of  the sphenoid and expanded.  The mucosa was dissected away from the face of  the sphenoid under direct vision.  Using  an osteotome, the rostrum was  incised and then rocked and delivered.  The mucosa of the internal surface  was cleared away.  The sphenoid sinus was visualized.  The mucosa in both  sides, with the left being asymmetrically larger, was carefully dissected  and removed.  Kerrison rongeurs were used to open the sphenoidotomy more  widely.  The pituitary was visualized.  A suction tip was placed at the  superior aspect of the apparent sella, and the C-arm x-ray confirmed the  location.  At this point, the procedure was turned over to Dr. Newell Coral of  neurosurgery for opening the sella and removing the pituitary tumor.  Hemostasis was observed.   After Dr. Newell Coral  completed his portion of the procedure including packing  a small amount of fat in the sella and using a cartilage bit to block any  opening, the procedure was returned to otolaryngology.  Blood was suctioned  from the septal tunnel and then remaining abdominal fat was parceled and packed into the sphenoid sinus on both sides.  The rostrum was  tailored  slightly and replaced to support the fat.  A small amount of blood was  suctioned free from the septal flaps.  The Hardy retractor was relaxed and  removed.   At this point, the septum was to be addressed finally.  The buckle was  incised from the right side, allowing it to straighten up.  Cut edges of the  cartilage were controlled with interrupted 4-0 chromic sutures to prevent  imbrication in the standard fashion.  The septum was secured to the nasal  spine with a figure-of-eight 4-0 PDS suture.  It had a tendency to  veer  towards the left.  Working  transmucosally on the left side and from the  septal tunnel on the right side, incision was made, separating the septum  from the upper lateral cartilages on both sides.  This allowed more mobility  and freedom to rotate into the midline.  There was good dorsal support and  an excellent dorsal and caudal strut.  The septal tunnel was suctioned free  1 more time, and a 2-cm drainage incision was made in a low posterior left  septal flap.   The columella was reattached, using 4-0 chromic sutures in the medial crurae  of the lower lateral cartilages and in the soft tissue.  The wound was  closed in a cosmetic fashion with interrupted 6-0 Ethilon sutures.  The  intranasal incisions were closed with interrupted 4-0 chromic sutures.  This  completed the septoplasty.  A 25-gauge spinal needle was inserted into each  inferior turbinate, anterior pole and a small amount of cautery applied on  both sides to slightly debulk the turbinates after mobilizing the septum and  replacing it more centrally.  There was no significant bleeding.   The 0.040 reinforced Silastic splints were fashioned and placed against the  septum on both sides and secured thereto with the 3-0 nylon stitch.  Quadruple-thickness nonadherent dressing packs were made, impregnated with  Neosporin ointment.  Upper packs were tagged with the 2-0 silk and were  placed in  between the middle turbinates and the septum, back towards the  rostrum.  The lower packs were placed along the septum and the inferior  turbinates, once again quadruple-thickness with Neosporin-ointment  impregnation.  Hemostasis was observed.  The patient was cleaned, and a drip  pad was applied.  The pharynx was suctioned free, and the throat pack was  removed.  The patient was returned to anesthesia, awakened, extubated, and  transferred to recovery in stable condition.   COMMENT:  A 60 year old white female with a pituitary tumor and also a  substantial anterior leftward septal deviation was the indication for the  several components of today's procedure.  Anticipate primary care per Dr.  Newell Coral with attention to neurosurgical implications and the endocrine  implications.  From my standpoint, we will emphasize elevation, ice, drip  pad, pharyngeal hygiene, analgesia, and antistaphylococcal antibiosis for 10 days.  Anticipate removing the packs at the 5--7-day mark and the splints at  the 7--10-day mark.  The patient could go home from the hospital whenever he  is stable from a neurosurgical standpoint to have his packs removed in my  office as appropriate.      Karo   KTW/MEDQ  D:  08/28/2004  T:  08/29/2004  Job:  [id number]   cc:   Jackie Plum, M.D.   Sherin Quarry, MD   chart

## 2011-03-26 NOTE — Discharge Summary (Signed)
[name], [name]            ACCOUNT NO.:  [id number]   MEDICAL RECORD NO.:  [id number]          PATIENT TYPE:  INP   LOCATION:  5527                         FACILITY:  [facility]   PHYSICIAN:  Jackie Plum, M.D.DATE OF BIRTH:  05/13/1951   DATE OF ADMISSION:  08/25/2004  DATE OF DISCHARGE:  09/01/2004                                 DISCHARGE SUMMARY   DISCHARGE DIAGNOSES:  1.  Acute bronchitis exacerbation, resolved.  2.  Clinical sialadenitis, improved.  3.  Chest tightness with cardiac catheterization evaluation being negative      on August 11, 2004 per cardiology.  4.  History of hypertension.  5.  History of smoking.  6.  Depression.  7.  Chronic obstructive pulmonary disease.  8.  Breast cancer.   DISCHARGE MEDICINES:  1.  Combivent MDI 2 puffs 4 times daily.  2.  Avelox 400 mg daily for 3 days.  3.  Hydrochlorothiazide 25 mg daily.  4.  Protonix 40 mg daily.  5.  Advair 250/50 mcg 1 puff twice daily.  6.  Zoloft 50 mg daily.  7.  Singulair 10 mg daily.  8.  Nystatin swish and swallow 4 times daily for 4 days only.   ACTIVITY:  Activity is as tolerated.  She is to return to work next week.   SPECIAL DISCHARGE INSTRUCTIONS:  She is to report to M.D. if she experiences  any problems including shortness of breath, chest pain, fever or chills.   FOLLOWUP:  She is to follow up with [facility] in 2-3 weeks.   CONSULTANT:  Dr. Lorain Childes of [facility].   PROCEDURE:  Cardiac catheterization, as noted above.   REASON FOR ADMISSION:  Acute bronchial asthma exacerbation.   HISTORY OF PRESENT ILLNESS:  The patient presented with shortness of breath  which had not responded to treatment with nebulizers and steroids.  She was  also coughing, which was productive of sputum.  She did not have any  significant chest pain or fever at the time of admission.  On admission, the  patient was hemodynamically stable and her cardiac exam was unremarkable.  Her  lung auscultation revealed diminished breath sounds with scattered  rhonchi and diffuse expiratory wheezes.  She was therefore admitted by Dr.  Sherin Quarry for acute asthma exacerbation.   HOSPITAL COURSE:  The patient was started on IV antibiotics with steroids  and oxygen supplementation.  She did worsen, however, continued to complain  of some new tightness of the chest which was different from that related to  her asthma.  Therefore, cardiology was consulted in view of her risk  factors, cardiac catheterization was done after stabilization,  which was  negative.  Of note, during hospitalization, the patient had complaints of  pain in her neck and her exam indicated swelling of her lymph nodes.  She  was seen by ENT, who gave a diagnosis of chemical sialadenitis possibly  related to iodinated dye .  With continuation of steroids and other  supportive measures, the patient's lymph node swelling improved remarkably  and she was discharged in stable and satisfactory condition.  Geor   GO/MEDQ  D:  11/24/2004  T:  11/24/2004  Job:  [id number]

## 2011-03-26 NOTE — Discharge Summary (Signed)
NAMEMALYIAH, FELLOWS                      ACCOUNT NO.:  [id number]   MEDICAL RECORD NO.:  [id number]                   PATIENT TYPE:  INP   LOCATION:  3741                                 FACILITY:  [facility]   PHYSICIAN:  Stephanie Swaziland, NP                DATE OF BIRTH:  02-11-1951   DATE OF ADMISSION:  11/18/2002  DATE OF DISCHARGE:  11/25/2002                                 DISCHARGE SUMMARY   DISCHARGE DIAGNOSES:  1. Chronic obstructive pulmonary disease/acute bronchitis.  2. Tobacco abuse.  3. Hypertension.  4. Depression/anxiety.  5. Gastroesophageal reflux disease.  6. History of breast cancer status post lumpectomy, chemotherapy and     radiation in 1991.  7. Status post total abdominal hysterectomy.   DISCHARGE MEDICATIONS:  1. Flonase one spray to each nostril twice a day.  2. Singular 10 mg daily.  3. Prednisone taper.  4. Advair 250/50 Diskus one puff twice a day.  5. Tequin 400 mg daily for two more days.  6. Albuterol multidose inhaler two puffs every six hours.  7. Atrovent multidose inhaler two puffs four times a day.   CONSULTS:  None for this hospitalization.   PROCEDURES AND STUDIES:  Chest x-ray 11/18/2002 with chronic changes, no  active disease, cardiomegaly.   Laboratory data:  Sodium 141.  Potassium 4.  Chloride 104.  CO2 26.  Glucose  132.  BUN 8.  Creatinine 0.7.  Calcium 9.6.   DISPOSITION:  The patient is being discharged home.   CONDITION ON DISCHARGE:  Stable.   HISTORY OF PRESENT ILLNESS:  A 60 year old female with history of COPD and  chronic bronchitis who was admitted to [facility] on 11/17/2002 with a 10-  day history of increased shortness of breath, cough, and chest tightness  with inability to lie down flat.  The patient had presented to [facility]  ER two days prior to this presentation and was given a nebulizer and a  prescription for prednisone which she did not get filled.  She was  previously hospitalized in October of  2003 with similar symptoms.  Initial  evaluation revealed poor air movement with diffuse expiratory wheezes and  PO2 of 70.  The patient was admitted for further evaluation and treatment.   HOSPITAL COURSE:  1. COPD/ACUTE BRONCHITIS:  The patient was admitted and started on I.V.     antibiotics, nebulizers, steroids.  The patient's respiratory status     slowly improved.  Was able to wean her oxygen to room air.  On discharge,     breath sounds are essentially clear.  She has no shortness of breath.     Her ambulatory pulse oximetry was sufficient at 96%.   1. TOBACCO ABUSE:  The patient previously smoked one-and-a-half packs of     cigarettes per day.  She was provided with extensive education.  A     smoking cessation consult was obtained.  The  patient was provided with a     nicotine patch during her hospitalization.  She has stated that she is     committed to quit smoking.   1. HYPERTENSION:  The patient has a history of hypertension and is currently     not on any medications.  Blood pressure was well controlled throughout     this hospitalization.   1. DEPRESSION/ANXIETY:  The patient did display some tearful emotions when     talking about her current medical condition.  Further evaluation and     treatment will be left to her primary care physician.   DISCHARGE INSTRUCTIONS AND FOLLOW-UP:  The patient has been instructed to  follow up with her primary care physician in one to two weeks.  She is to  avoid all tobacco products.   DICTATED FOR:  Deirdre Peer. Polite, M.D.                                               Stephanie Swaziland, NP    SJ/MEDQ  D:  11/25/2002  T:  11/26/2002  Job:  [id number]

## 2011-03-26 NOTE — Discharge Summary (Signed)
[name], [name]            ACCOUNT NO.:  [id number]   MEDICAL RECORD NO.:  [id number]          PATIENT TYPE:  INP   LOCATION:  5015                         FACILITY:  [facility]   PHYSICIAN:  Lonia Blood, M.D.      DATE OF BIRTH:  15-Mar-1951   DATE OF ADMISSION:  03/08/2006  DATE OF DISCHARGE:                                 DISCHARGE SUMMARY   PRIMARY CARE PHYSICIAN:  Unassigned, but she is being set up for [facility].   DISCHARGE DIAGNOSES:  1.  Severe asthma, COPD exacerbation.  2.  Diabetes type 2 worsened by hospital steroids.  3.  Hypertension.  4.  Anxiety/depression.  5.  Oral thrush.  6.  Fall on Mar 13, 2006.  7.  Obesity.  8.  Prior history of carcinoma of the right breast status post lumpectomy      and chemotherapy in 1990.  9.  History of back pain.   DISCHARGE MEDICATIONS:  To be determined at the time of discharge.   DISPOSITION:  The patient is currently improving with nebulizers and IV  steroids.  She is no longer wheezing, but she is coughing a lot.  The goal  is once she is stable she will be discharged home.  Follow up with [facility] hopefully.   PROCEDURES:  Chest x-ray on Mar 08, 2006, showed no active cardiopulmonary  disease.  Only COPD.   CONSULTATIONS:  None.   BRIEF HISTORY AND PHYSICAL:  Please refer to dictated History and Physical  by Dr. Michaelyn Barter.  It showed, however, the patient is a 60 year old  female with known history of COPD.  The patient has never had any smoking  and her diagnosis was somewhat presumed to be either COPD or asthma.  She  has problem in getting a primary care physician.  Was not accepted in [facility].  Hence, she ran out of her medication.  She came in with severe  shortness of breath and wheezing.  In the emergency room, she was found to  be severely wheezing, tachypneic, not moving air at all.  She continued to  smoke tobacco as well which may have exacerbated her symptoms.  She was  tachypneic  in the ED as indicated with worsening wheezing and decreased  breath sound heard.  She was subsequently admitted for management of COPD,  asthmatic exacerbation.   HOSPITAL COURSE:  #1 - ASTHMA/COPD EXACERBATION.  The patient was admitted,  started on IV Solu-Medrol, albuterol and Atrovent nebulizers as well as IV  Avelox.  She responded very poorly in the beginning and her symptoms were  prolonged.  She continued to cough continuously which has led to her rib  cage pain.  However, no fever.  At this point, however, her wheezing has  virtually subsided, and she continues to cough up thick mucous when  maintained on antibiotics with no apparent change at this stage.  She has  now been converted to oral prednisone taper which she continued to take,  even after discharge.   #2 - DIABETES.  The patient has hemoglobin A1C of 6.2 showing  good control  as an outpatient.  However, with initiation of steroids into the hospital,  her sugars have run into the 300's.  We have set her up on Lantus and  gradually increased the dose of the Lantus.  Will endeavor to titrate the  Lantus down as we taper off her steroids as well.   #3 - HYPERTENSION.  Her blood pressure was poorly controlled and we have  been struggling with the control of her blood pressure during this  hospitalization.  At this point, we have made some changes to her  medications and we have been trying to avoid beta blockers due to her  bronchospasms.  Her blood pressure is good today and will continue to adjust  medications as necessary.   #4 - ANXIETY/DEPRESSION.  Again, this patient has had this long standing  history.  We have tried using anxiolytics while she is in the hospital to  help her cope.  There is no evidence of any worsening of her anxiety.  She  uses Ambien at night also for sleeping and Ativan as needed.   #5 - ORAL THRUSH.  The patient has started complaining of sore throat on Mar 12, 2006.  On examination of her  mouth, she had overwhelming thrush on her  tongue which may be secondary to a combination of her diabetes, the use of  antibiotics in the hospital as well as the use of steroids.  Would place her  on medication, antifungals, to help control that.   #6 - FALL.  The patient fell on Mar 13, 2006, but did not sustain any injury  at that point.  We continued to observe her for any injuries that may appear  later.      Lonia Blood, M.D.  Electronically Signed     LG/MEDQ  D:  03/13/2006  T:  03/14/2006  Job:  [id number]

## 2011-03-26 NOTE — H&P (Signed)
[name], [name]            ACCOUNT NO.:  [id number]   MEDICAL RECORD NO.:  [id number]          PATIENT TYPE:  INP   LOCATION:                               FACILITY:  [facility]   PHYSICIAN:  Corinna L. Lendell Caprice, MDDATE OF BIRTH:  06-Mar-1951   DATE OF ADMISSION:  09/15/2004  DATE OF DISCHARGE:                                HISTORY & PHYSICAL   CHIEF COMPLAINT:  Shortness of breath.   HISTORY OF PRESENT ILLNESS:  [name] is a 60 year old black female with  COPD and continued smoking who was just discharged from the hospital a  little over a week ago with a COPD exacerbation.  She reports that her  grandson is sick and she thinks she may have caught something from him.  She  continues to smoke.  She has had sore throat, cough productive of thick  greenish sputum, night sweats, no fevers or chills.  She has a nebulizer at  home which she shares with her grandson.  She is complaining of spasm in her  side from coughing so much as well as a headache.   PAST MEDICAL HISTORY/MEDICATIONS/SOCIAL HISTORY/FAMILY HISTORY:  Reviewed  and as per previous hospitalizations.   REVIEW OF SYSTEMS:  CONSTITUTIONAL:  As above.  HEENT:  As above.  RESPIRATORY:  As above.  CARDIOVASCULAR:  She had a cardiac catheterization  on August 31, 2004 which was normal.  This was done by Dr. Loraine Leriche Pulsipher.  GI:  No nausea, vomiting, diarrhea.  GU:  No dysuria.  MUSCULOSKELETAL:  As  above.  PSYCHIATRIC:  She feels quite discouraged and depressed about being  ill.  SKIN:  No rash.  ENDOCRINE:  No diabetes.  HEMATOLOGIC:  No history of  thromboses.   PHYSICAL EXAMINATION:  OXYGEN SATURATION:  97% on room air.  TEMPERATURE:  100.1.  BLOOD PRESSURE:  144/86.  PULSE:  105.  RESPIRATORY RATE:  24.  GENERAL:  The patient is a very uncomfortable-appearing black female who can  speak in brief sentences only due to respiratory distress.  She is tearful  and writhing on the stretcher.  HEENT:  She has moon  facies.  Normocephalic, atraumatic.  Pupils equal,  round, and reactive to light.  Moist mucous membranes.  Oropharynx is  without erythema or exudate.  NECK:  Supple.  No lymphadenopathy.  Her neck is thick.  LUNGS:  She has diffuse expiratory wheeze.  No rhonchi or rales.  CARDIOVASCULAR:  Tachycardic.  Difficult to auscultate due to the wheezing.  ABDOMEN:  Soft, nontender, nondistended.  GU/RECTAL:  Deferred.  EXTREMITIES:  No clubbing, cyanosis, or edema.  SKIN:  No rash.  PSYCHIATRIC:  Patient is tearful and somewhat agitated, but cooperative.   LABORATORIES:  None.   CHEST X-RAY:  Negative.   ASSESSMENT AND PLAN:  1.  Chronic obstructive pulmonary disease exacerbation.  The patient has      been unable to quit smoking or follow up with [facility].  This      continues to be problematic.  She will get Rocephin and azithromycin      hand-held nebulizers, Humibid, oxygen, steroids.  She will be admitted      to the floor.  I will check an ABG and some baseline blood work.  2.  Continued tobacco abuse.  Counseled against.  3.  History of depression.      [name]   CLS/MEDQ  D:  09/15/2004  T:  09/15/2004  Job:  [id number]

## 2011-03-26 NOTE — Consult Note (Signed)
NAMECEAIRA, ERNSTER            ACCOUNT NO.:  [id number]   MEDICAL RECORD NO.:  [id number]          PATIENT TYPE:  INP   LOCATION:  5703                         FACILITY:  [facility]   PHYSICIAN:  Lorain Childes, M.D. LHCDATE OF BIRTH:  1951-07-25   DATE OF CONSULTATION:  DATE OF DISCHARGE:                                   CONSULTATION   DATE OF CONSULTATION:  August 29, 2004.   CHIEF COMPLAINT:  Chest tightness and wheezing.   HISTORY OF PRESENT ILLNESS:  The patient is a 60 year old female with no  prior cardiac history or  cardiac risk factors including long-standing  tobacco use and hypertension, who was admitted on October 18th for COPD  exacerbation.  During her hospitalization, she was treated with steroids and  nebulizers with some improvement in her respiratory status.  Since she had  complaints of chest pain and this is her third admission for chest tightness  and shortness of breath, she underwent a dobutamine-Cardiolite on August 28, 2004.  This revealed inferior wall reversible defect.  This study was  __________ secondary to an irregular heartbeat.  Cardiology was consulted  for further evaluation of her abnormal Cardiolite and history of chest  tightness.  Following her Cardiolite, patient subsequently developed neck  swelling.  ENT was seen and suspected she had a secondary chemical  sialadenitis, and she has been treated with Benadryl for this.  During this  hospitalization, her cardiac enzymes were checked x2 and remained normal.  Her admission EKG showed sinus tach, but had no ischemic changes.  She  reports exertional chest tightness for the past 2 months after walking up  stairs and after working in her garden.  This is relieved with rest.  It is  associated with wheezing.  No nausea or vomiting, no diaphoresis.  Denies  any PND, orthopnea.  No lower extremity edema.  Her last episode of chest  tightness was on the day admission.  She says she has had none  since she has  been in the hospital.   PAST MEDICAL HISTORY:  1.  Tobacco abuse, longstanding.  2.  Hypertension.  3.  History of asthmatic bronchitis.  4.  History of breast cancer in 1991.  She is a status post a lumpectomy,      chemotherapy, and radiation therapy.  5.  Status post hysterectomy.  6.  COPD.  7.  Depression.   MEDICATIONS:  1.  Albuterol MDI.  2.  Atrovent MDI.  3.  Nebulizers p.r.n.  4.  Humibid-LA 1200 mg p.o. b.i.d.  5.  Tussionex 5 mL q.12h.  6.  Prednisone 40 mg p.o. daily.  7.  Protonix 40 mg p.o. b.i.d.  8.  Hydrochlorothiazide 25 mg p.o. daily.  9.  Zoloft 50 mg p.o. daily.  10. Singulair.  11. Avelox 400 mg daily.   ALLERGIES:  She has no known drug allergies.  She denies a shellfish  allergy.   SOCIAL HISTORY:  She lives in [place] with her husband.  She has 2  children.  She has smoked half a pack per day for the past 35 years.  She  drinks approximately 4 beers per week.  She denies any substance abuse nor  of  her medications.  She does some light stretching at home and  occasionally walks, but no set exercise routine.   FAMILY HISTORY:  Her mother is 71.  Denies any prior cardiac history.  Her  father died in his 47s with end-stage renal disease.  She has a brother who  died in his 44s related to kidney failure, also.   REVIEW OF SYSTEMS:  She denies any fevers, chills, no sweats.  Her weight  has been fairly stable; however, it does fluctuate when she is given  prednisone therapy.  She denies any headache or visual changes.  No skin  rashes or lesions.  CARDIOPULMONARY:  Per HPI.  No palpitations.  No  syncope.  She does report occasional leg tightness with possible  claudication symptoms.  She does report coughing and wheezing.  She denies  any urinary changes.  No myalgias or arthralgias.  No nausea, vomiting,  diarrhea.  No bright red blood per rectum.  She does report GERD symptoms.  No polyuria, polydipsia.  No heat or cold  intolerance.  All other systems  are negative.   PHYSICAL EXAMINATION:  VITAL SIGNS:  Temperature is 97.9, pulse 86,  respirations 20, blood pressure 190/93, and sating 96% on room air.  GENERAL:  She is a very pleasant female, comfortable, and in no acute  distress.  HEENT:  Normocephalic, atraumatic.  Pupils are round and reactive to light.  Oropharynx is clear.  NECK:  Supple.  Full range of motion.  Her JVP is approximately 7 cm.  There  is no lymphadenopathy.  CARDIOVASCULAR:  Normal S1 and split S2.  Regular rhythm. There is a  systolic murmur, grade 2/6, crescendo-decrescendo.  LUNGS:  She has wheezing throughout with fair air exchange.  No rhonchi.  No  rales.  SKIN:  No rashes or lesions.  ABDOMEN:  Soft, positive bowel sounds, nontender.  EXTREMITIES:  She has 2+ distal pulses.  No edema.  She has soft bilateral  bruits in the femoral.  NEURO:  She is alert and oriented x3.  Strength is appropriate and nonfocal.   Chest x-ray showed no acute disease.  EKG:  Rate 102.  Rhythm:  Sinus  rhythm, normal axis, no acute ischemic changes.   LABORATORY DATA:  Total white count was 8.8, hematocrit is 39, platelets  361.  Potassium 4.1, creatinine 0.6, glucose 177.  Total bili, alkaline  phosphatase, AST, and ALT were normal.  BNP was less than 30.  CK is 58, MB  1.8, troponin 0.03.  Repeat CK 64, MB 2.0, troponin 0.02.  Urine did not  show any blood.   ASSESSMENT AND PLAN:  This is a 60 year old pleasant female with cardiac  risk factors, who is status post a dobutamine-Cardiolite now found to have  possible inferior ischemia.  1.  Coronary artery disease:  Patient reports anginal symptoms for the past      couple of months and now has an abnormal Cardiolite.  We will transfer      her to telemetry and plan for a cardiac catheterization on Monday to      evaluate her anatomy.  For medical therapy, we will start her on an     aspirin and start Amlodipine to control her blood  pressure.  We are      holding a beta blocker as she is actively wheezing and being treated for  a chronic obstructive pulmonary disease exacerbation.  Will check a      fasting lipid panel and a hemoglobin A1c for risk factor stratification.      We have discussed in detail a smoking cessation.  2.  Chronic obstructive pulmonary disease exacerbation:  She is currently      being treated by her primary physician for this.  We will continue her      current therapy.  3.  Hypertension:  Blood pressure is still markedly elevated.  We are      starting Amlodipine as described above.  We will titrate this as needed      and add additional agents if needed.  4.  Hyperglycemia:  Her glucose has been running elevated.  This may be      related to her steroid use.  We will check a hemoglobin A1c.  5.  Neck swelling:  Patient had neck swelling and has been evaluated by ENT.      This appears improved after being treated with Benadryl.  They suspect      she had a chemical sialadenitis.  6.  History of abnormal heart rhythm during her stress test:  It is unclear      if she was having PACs or what the rhythm was by the current notes.      Will place her on telemetry as described above and monitor her rhythm      closely.       CGF/MEDQ  D:  08/29/2004  T:  08/29/2004  Job:  [id number]

## 2011-03-26 NOTE — Discharge Summary (Signed)
[place]  Patient:    [name], [name]                     MRN: [id number] Adm. Date:  01/23/01 Disc. Date: 01/26/01 Attending:  Rosanne Sack, M.D. CC:         Stacie [place]. Cliffton Asters, M.D.   Discharge Summary  DISCHARGE DIAGNOSES: 1. Acute asthma exacerbation complicated with acute bronchitis. 2. Hypokalemia. 3. Gastroesophageal reflux disease. 4. Postnasal drip. 5. Ongoing smoking. 6. Borderline blood pressure, rule out hypertension, to be followed    Dr. Laurann Montana, undetermined if therapy is needed. 7. Depression. 8. History of right breast cancer.    a. Status post right lumpectomy.    b. Chemotherapy and radiation therapy in 1991. 9. Status post hysterectomy.  DISCHARGE MEDICATIONS: 1. Prednisone taper 50 mg p.o. q.d. x 2 days, 40 mg p.o. q.d. x 2 days, then    30 mg p.o. q.d. x 2 days, then 20 mg p.o. q.d. x 2 days, then 10 mg p.o.    q.d. x 2 days, then stop. 2. Advair 500/50 diskus one puff b.i.d. 3. Albuterol MDI two puffs every six hours p.r.n. 4. Vancenase nasal spray one puff to each nostril b.i.d. 5. Protonix 40 mg p.o. q.d. 6. Claritin 10 mg p.o. q.d. 7. Zithromax 250 mg p.o. q.d. x 2 more days. 8. Zoloft 50 mg p.o. q.d. 9. Xanax 0.25 mg t.i.d.  FOLLOWUP:  Ms. Cortese will be followed by Dr. Laurann Montana on January 30, 2001, at [facility].  Dr. Cliffton Asters is to reassess the patients lung exam.  We recommended followup chest x-ray within two to three weeks.  The peak flows at the time of discharge were in the 350 range.  Finally, Dr. Cliffton Asters is to follow up closely the patients blood pressure and decide about medical therapy.  DIET:  Low salt diet.  ACTIVITY:  Exercise recommended.  CONSULTATIONS:  None.  PROCEDURES:  None.  DISCHARGE LABORATORY DATA AND X-RAY FINDINGS:  Sodium 136, potassium 3.5, chloride 105, CO2 25, BUN 9, creatinine 0.7, glucose 175 (due to high steroid therapy).  LFTs within  normal limits.  Albumin was 3.4.  Hemoglobin 13.1, MCV 90, WBC 6.4, platelets 290.  Chest x-ray was consistent with acute bronchitis and COPD changes.  HISTORY OF PRESENT ILLNESS:  [name] is a very pleasant, 60 year old, female admitted to [facility] on January 23, 2001, with acute asthma exacerbation.  Please see admission H&P by Dr. Laurann Montana for further details regarding the history of presentation, physical exam and lab data.  HOSPITAL COURSE:  #1 - ACUTE ASTHMA EXACERBATION/CHRONIC OBSTRUCTIVE PULMONARY DISEASE EXACERBATION COMPLICATED WITH ACUTE BRONCHITIS:  The patient had a previous history of asthma.  The chest x-ray was consistent with COPD changes and acute bronchitis findings.  The patient was admitted for intensive bronchodilator treatments.  Flonase, Singular and Claritin also were continued.  Smoking cessation was addressed.  The patient declined a nicotine patch.  On day #2, due to persistent bronchospasm and shortness of breath, prednisone tablets were started.  On the following day, intravenous steroid therapy was started for more effective therapy due to the persistent signs of bronchospasm with low peak flows.  On intravenous steroid therapy, the patient experienced significant improvement.  The bronchodilator treatments were switched to Advair diskus.  The patients hypoxemia resolved by the time of discharge.  The peak flows had improved from low 200 to 350 post Advair therapy.  I  suspect that this patient by now has COPD with COPD exacerbation. Also other factors contributing her bronchospasms were smoking, post nasal drip and GERD.  We addressed smoking cessation encouraging the patient to stop smoking.  Protonix was added for GERD symptoms.  Claritin and steroids were also given to the patient to help with the symptoms.  With this global approach to the patients bronchospasm, her symptoms were significantly improved.  The oxygen saturation was 96%  on room air at the time of discharge.  #2 - TRANSIENT HYPOKALEMIA:  The patient had slight dehydration.  The serum potassium level came down to 3.5 due to aggressive bronchodilator therapy. There were no complications noted due to this mild hypokalemia.  We recommended to follow another BMP within three to four days to confirm the resolution of this mild and transient decline of the serum potassium level.  #3 - POST NASAL DRIP:  As related to problem #1.  #4 - SMOKING:  As related to problem #1.  #5 - BORDERLINE HIGH BLOOD PRESSURE, RULE OUT HYPERTENSION:  The patients blood pressure increased to 150/80 once intravenous steroid therapy was started.  On the day of discharge, the steroid therapy was switched to tablets of prednisone.  Dr. Laurann Montana will follow the blood pressure.  We recommended a low salt diet and exercise as a first step to try to decrease this blood pressure.  Once the patient finishes with steroid therapy, Dr. Cliffton Asters will decide if the patient requires antihypertensive agents.  CONDITION ON DISCHARGE:  Improved.  DD:  01/26/01 TD:  01/27/01 Job: [id number]

## 2011-03-26 NOTE — H&P (Signed)
[name], [name]            ACCOUNT NO.:  [id number]   MEDICAL RECORD NO.:  [id number]          PATIENT TYPE:  INP   LOCATION:  3733                         FACILITY:  [facility]   PHYSICIAN:  Elliot Cousin, M.D.    DATE OF BIRTH:  [birth year]   DATE OF ADMISSION:  12/25/2005  DATE OF DISCHARGE:                                HISTORY & PHYSICAL   PRIMARY CARE PHYSICIANS:  The patient is unassigned.   CHIEF COMPLAINT:  Shortness of breath, productive cough with yellow sputum,  wheezing, nasal congestion, and nausea.   HISTORY OF PRESENT ILLNESS:  The patient is a 60 year old lady with a past  medical history significant for COPD/asthmatic bronchitis, tobacco use, and  hypertension, who presents to the emergency department with a 1-week history  of progressive shortness of breath, productive cough with yellow sputum, and  wheezing.  The patient also has associated nasal congestion with rhinorrhea,  nausea, and subjective fever and chills.  She denies pleurisy.  No recent  sore throat or earache.  No vomiting.  No abdominal pain, diarrhea, or  dysuria.  The only chest pain she complains of is chest wall pain when she  is coughing.  She was recently exposed to her grandson who had a cold 2  weeks ago.  She smokes 1/2 a pack of cigarettes per day.  However, she has  been unable to smoke any cigarettes over the past 3 days.  She has used her  albuterol nebulizer multiple times over the past 24 hours.  In addition, she  has used her albuterol MDI  every 2 hours for the past 24-48 hours.  The  patient has been noncompliant with Singulair and Advair secondary to a lack  of finances.   During the evaluation in the emergency department, the patient is noted to  be tachycardic, with a heart rate ranging between 109-115 beats per minute.  Her chest x-ray reveals chronic bronchitis/COPD, but with no acute findings.  She is afebrile.  Her white blood cell count is within normal limits at 6.4.  The patient has been given multiple albuterol and Atrovent nebulizers.  However, she is still quite symptomatic.  The patient will therefore be  admitted for further evaluation and management.   PAST MEDICAL HISTORY:  1.  Frequent admissions for COPD/asthmatic bronchitis exacerbations.  2.  Hypertension.  3.  Chest pain in October 2005.  Cardiac catheterization revealed a normal      ejection fraction and no significant coronary artery disease per Dr.      Gerri Spore.  4.  Ongoing tobacco abuse.  5.  Left breast cancer, status post lumpectomy, chemotherapy, and radiation      therapy in 1991.  6.  Status post hysterectomy.  7.  History of depression.  8.  History of steroid-induced hyperglycemia.  The patient's hemoglobin A1c      in December 2006 was 5.8.  9.  Noncompliance.   MEDICATIONS:  1.  The patient has been treated with antihypertensive medications in the      past including hydrochlorothiazide and Lotensin.  She is not taking any  antihypertensive medications now because of a lack of finances.  2.  Singulair 10 mg daily (not taking).  3.  Advair 250/50, 1 puff b.i.d. (not taking).  4.  Albuterol nebulizer q.4-6 h. p.r.n.  5.  Albuterol MDI 2 puffs q.2-3 h. p.r.n.   ALLERGIES:  No known drug allergies.   SOCIAL HISTORY:  The patient is married.  [place]  She has two children.  She is employed part time  with a [employer], [employer].  She smokes 1/2 a pack of cigarettes per day and  has been doing so for more than 25 years.  She drinks alcohol mostly on the  weekend which consists of several beers and several mixed drinks.  She  completed high school.  She can read and write.   FAMILY HISTORY:  Her mother died at age 98 years of age secondary to  cervical cancer.  Her father died of what sounds to be kidney failure.  She  has 2 brothers who are drug addicts.  She had a sister who died of unknown  causes.   REVIEW OF  SYSTEMS:  Review of systems is positive for occasional depression,  but no suicidal ideations.  Positive for intermittent shortness of breath  and chest tightness.  Positive for occasional headache and light-headedness.  Positive for occasional cough with clear and yellow sputum.  Otherwise,  review of systems is negative.   PHYSICAL EXAMINATION:  VITAL SIGNS:  Temperature afebrile; pulse is 115;  blood pressure 145/54; respiratory rate ranging from 20-28; oxygen  saturation on 2 liters ranging from 96%-100%.  GENERAL:  The patient is a pleasant, 60 year old, mildly overweight African-  American woman who is currently lying in bed in mild respiratory distress.  HEENT:  Head is normocephalic, nontraumatic.  Pupils are equal, round, and  reactive to light.  Extraocular movements are intact.  Conjunctivae are  clear.  Sclerae are white.  Tympanic membrane are clear bilaterally.  Nasal  mucosa is edematous.  Mild maxillary sinus tenderness bilaterally.  No  active nasal drainage.  Oropharynx reveals multiple missing teeth, with  evidence of caries and gingivitis.  Mucous membranes are mildly dry.  No  posterior exudates or erythema.  NECK:  Supple.  No adenopathy, no thyromegaly, no bruit, no JVD.  LUNGS:  The patient's breathing is not labored at this time.  She speaks in  complete sentences without use of accessory respiratory muscles.  There are  diffuse wheezes throughout the lung fields bilaterally.  Good air movement  down to the bases.  HEART:  S1, S2, with mild tachycardia.  ABDOMEN:  Mildly obese.  Positive bowel sounds.  Soft, nontender,  nondistended.  No hepatosplenomegaly.  No masses palpated.  EXTREMITIES:  Pedal pulses are 2+ bilaterally.  No pretibial edema.  No  pedal edema.  No acute joint abnormalities.  The patient has good range of  motion of her extremities upper and lower. PSYCHOLOGICAL:  The patient is tearful about the difficulty with finances at  home.  The patient  acknowledges that she cannot afford to buy cigarettes.  However, she finds the means to do so.  She denies suicidal ideation.  NEUROLOGIC:  The patient is alert and oriented x3.  Cranial nerves II-XII  are intact.  Strength is 5/5 throughout.  Sensation is intact to soft touch.   ADMISSION LABORATORIES:  Chest x-ray reveals no acute findings, COPD/chronic  bronchitis.  Creatinine 0.7.  Sodium 138, potassium 3.5,  chloride 109,  glucose 118, BUN 10, CO2 23.  WBC 6.4, hemoglobin 13.3, platelets 332.   ASSESSMENT:  1.  Chronic obstructive pulmonary disease/acute asthmatic bronchitis      exacerbation.  The patient has had multiple admissions for the same.      The problem is and has been noncompliance with medical therapy.  The      patient has been advised and counseled on followup with [facility] and      the outpatient clinics at [facility].  The patient has failed      to follow up with these clinics on numerous occasions.  Currently, she      appears to be somewhat improved following multiple nebulizers and      prednisone given by the emergency department physician. She is      oxygenating adequately.  She does warrant admission.  2.  Tobacco abuse.  It is interesting to note that the patient has enough      finances to buy cigarettes and alcohol but complains about not being      able to afford medications.  3.  Hypertension.  The patient has been treated with Lotensin and      hydrochlorothiazide in the past.  She has been noncompliant over the      past 4-5 weeks.  4.  Borderline hypokalemia.  The patient's serum potassium is 3.5.  I expect      the serum potassium to decrease further with nebulizer treatments and      steroid therapy.  5.  Noncompliance.  As stated above.   PLAN:  1.  The patient will be admitted for further evaluation and management.  2.  Per the emergency department physician, the patient received numerous      albuterol and Atrovent nebulizers as  well as 100 mg of prednisone.  3.  Will continue therapy with Solu-Medrol 80 mg IV q.6 h.; empiric      antibiotic treatment with Avelox 400 mg IV; symptomatic treatment with      Nasonex; Claritin; and Tussionex.  Will also add Humibid LA twice daily.  4.  Will give 1 mg of magnesium sulfate IV.  5.  Will replete potassium chloride by mouth and in the IV fluids.  6.  Nebulizers q.4 h. with albuterol and Atrovent and then q.2 h. p.r.n.  7.  Will assess the patient's BNP, TSH, and EKG.  Will also check a      hemoglobin A1c.  8.  Case management consult.  Financial advisor consult.  9.  Nicotine patch.  Tobacco cessation counseling.  The patient was advised      to stop smoking.  10. Cover anticipated elevated blood sugars with sliding scale insulin      regimen and Lantus.  11. Will treat hypertension with Cardizem CD 180 mg daily.  Will avoid beta     blockade secondary to potential worsening of bronchospasms.      Elliot Cousin, M.D.  Electronically Signed     DF/MEDQ  D:  12/26/2005  T:  12/26/2005  Job:  [id number]

## 2011-03-26 NOTE — H&P (Signed)
[name], [name]                      ACCOUNT NO.:  [id number]   MEDICAL RECORD NO.:  [id number]                   PATIENT TYPE:  INP   LOCATION:  3031                                 FACILITY:  [facility]   PHYSICIAN:  Sherin Quarry, MD                   DATE OF BIRTH:  1951/09/18   DATE OF ADMISSION:  07/22/2003  DATE OF DISCHARGE:                                HISTORY & PHYSICAL   HISTORY OF PRESENT ILLNESS:  Ms. Brabant was discharged from the hospital  on August 24.  Unfortunately, she has continued to smoke about one-half to  one pack of cigarettes per day.  Over the last seven days she has noted  increased cough productive of yellowish phlegm associated with chest  congestion, chest tightness, and dyspnea on exertion.  For the last two  night she has been unable to sleep secondary to dyspnea and cough.  She  presented to the [facility] last night and has received  decadron one dose intravenously as well as albuterol by nebulization.  A  chest x-ray was obtained which was negative.  As the patient has continued  to have wheezing, she is admitted to the hospital for further treatment.  There has been no recent history of fever, chest pain, nausea, or vomiting.   ALLERGIES:  No known drug allergies.   CURRENT MEDICATIONS:  1. Zoloft 50 mg daily which she ran out of.  2. Flovent and Combivent two puffs q.i.d.  3. Singulair 10 mg daily.  4. Hydrochlorothiazide 12.5 mg daily.  5. Advair one puff q.12h.   PAST SURGICAL HISTORY:  1. Lumpectomy.  2. Chemotherapy and radiation therapy in 1991.  3. Status post hysterectomy.   PAST MEDICAL HISTORY:  As above.   FAMILY HISTORY:  The patient's mother had breast cancer and diabetes.  She  has two brothers who are drug addicts.  She does not know too much about  their health history.  She has a sister who died of unknown causes and a  brother who died of renal failure.   SOCIAL HISTORY:  The patient is  married.  She has two adult children.  She  will occasionally use alcohol.  She denies use of drugs.  She smokes about  one-half to one pack of cigarettes per day.   REVIEW OF SYSTEMS:  HEENT:  Head:  She denies headache or dizziness.  Eyes:  She denies visual blurring or diplopia.  Ear, nose, and throat:  Denies ear  aches, sinus pain, or sore throat.  CHEST:  See above.  CARDIOVASCULAR:  See  above.  GASTROINTESTINAL:  Denies nausea, vomiting, abdominal pain, change  in bowel habits, melena, or hematochezia.  GENITOURINARY:  Denies dysuria,  urinary frequency, hesitancy, or nocturia.  RHEUMATOLOGIC:  Denies back pain  or joint pain.  HEMATOLOGIC:  Denies easy bleeding or bruising.  NEUROLOGIC:  Denies history of seizure  or stroke.   PHYSICAL EXAMINATION:  GENERAL:  Obese, tearful lady who is in mild  respiratory distress at this time.  HEENT:  Within normal limits.  CHEST:  Diminished breath sounds.  There are diffuse marked expiratory  wheezes with prolongation expiratory phase.  CARDIOVASCULAR:  Normal S1 and S2.  The heart rate is about 120.  It is  regular.  There are no rubs, murmurs, or gallops.  ABDOMEN:  Benign.  Normal bowel sounds without masses, tenderness,  organomegaly.  NEUROLOGIC:  Within normal limits.  EXTREMITIES:  Within normal limits.   LABORATORIES:  Chest x-ray showed no acute changes.  There is evidence of  COPD.  Urinalysis was normal.  White count was 9800.  Hemoglobin 13.6.  CMET  was normal.   IMPRESSION:  1. Chronic obstructive pulmonary disease.  2. Acute exacerbation of chronic bronchitis.  3. 50-pack-year smoking history.  4. History of breast cancer status post chemotherapy and radiation therapy.  5. Status post hysterectomy.  6. Depression.   PLAN:  The patient will be admitted to the hospital at this time for further  treatment of this acute exacerbation of chronic bronchitis.  I have told Ms.  Poinsett that unless she immediately  discontinues cigarette smoking,  unfortunately, her life expectancy is going to be limited.  I urged her to  contact her primary doctor if she ran out of her medications and that  possibly her doctor would be able to help her with samples, if necessary.                                                Sherin Quarry, MD    SY/MEDQ  D:  07/22/2003  T:  07/22/2003  Job:  [id number]   cc:   Stacie Acres. White, M.D.  [address]  [place]  [place] [address]  Fax: [phone]

## 2011-03-26 NOTE — Discharge Summary (Signed)
[name], [name]            ACCOUNT NO.:  [id number]   MEDICAL RECORD NO.:  [id number]          PATIENT TYPE:  INP   LOCATION:  3021                         FACILITY:  [facility]   PHYSICIAN:  Kela Millin, M.D.DATE OF BIRTH:  06-17-1951   DATE OF ADMISSION:  09/14/2004  DATE OF DISCHARGE:  09/20/2004                                 DISCHARGE SUMMARY   DISCHARGE DIAGNOSES:  1.  Chronic obstructive pulmonary disease exacerbation.  2.  Hypertension.  3.  Steroid induced hyperglycemia.  4.  Continued tobacco use.  5.  History of depression.   CONSULTATIONS:  None.   PROCEDURE:  None.   HISTORY OF PRESENT ILLNESS:  The patient is a 60 year old black female with  history of chronic obstructive pulmonary disease and continued smoking who  had just been discharged from the hospital about a week prior to  presentation following treatment of chronic obstructive pulmonary disease  exacerbation. She reported that her grandson had been sick and she thought  that she might have caught something from him. She complained of a sore  throat with cough productive of thick greenish sputum, night sweats. No  fevers and no chills. She stated that she had a nebulizer at home, which she  shared with her grandson. She was complaining of spasms in her side from  coughing as well as a headache.   PHYSICAL EXAMINATION:  The physical examination upon admission per Dr.  Lendell Caprice revealed:  VITAL SIGNS:  Temperature of 100.1 with a blood pressure of 146/86, pulse of  105, respiratory rate of 24.  GENERAL:  She appeared very uncomfortable. Was speaking in very brief  sentences only due to respiratory distress.  She was tearful and rising on  the stretcher.  HEENT:  She had a moon face. She was normocephalic, atraumatic. Pupils are  equal, round, and reactive to light. She had moist mucous membranes. Her  pharynx was clear with no erythema and no exudates.  NECK:  Examination supple with no  adenopathy.  LUNGS:  Noted to have diffuse expiratory wheezes. No rhonchi. No crackles.  CARDIOVASCULAR:  She was tachycardiac on examination. Difficult to  auscultation secondary to the wheezing.  ABDOMEN:  Soft, nontender, nondistended.  EXTREMITIES:  No clubbing, cyanosis, or edema.  SKIN:  No rash.   LABORATORY DATA:  ABG on room air showed a pH of 7.398 with a pCO2 of 35.12,  pO2 of 81 and her O2 saturation was 96.8. Her white cell count was 9.6 with  a hemoglobin of 12, hematocrit 33, platelet count was 340,000.   Her chest x-ray was negative for acute disease.   HOSPITAL COURSE BY PROBLEM:  1.  COPD EXACERBATION:  Upon admission, the patient was started on IV      antibiotics as well as bronchodilators including Advair and also she was      continued on her Singulair and started on IV steroids, Solu-Medrol. She      improved slowly during her hospital stay, remained afebrile, and was      oxygenating well.  __________Tussionex as well as Humibid were added to  her treatment regimen, as her cough was persisting. Case management was      consulted during her hospital stay and saw her regarding her medications      and will be assisting with her medications at the time of discharge. Her      symptoms have improved with a decrease in her cough. She has remained      afebrile and hemodynamically stable. She is oxygenating well and she      will be discharged at this time on oral antibiotics as well as anti-      tussive's and a prednisone taper. She is to followup with [facility].      She has been counseled to quit smoking.  2.  HYPERTENSION:  The patient's blood pressures were persistently elevated      during her hospital stay and this was aggravated by the steroids. She      was started on Lisinopril for blood pressure control while in the      hospital and will be discharged on this. She is to followup with [facility].  3.  STEROID-INDUCED HYPERGLYCEMIA:  The  patient's blood sugars were      controlled on the Solu-Medrol and she was started on Lantus and Accu-      checks were monitored during her hospital stay. Following the change to      oral prednisone, the blood sugars dropped substantially and her last      Accu-check prior to discharge was 88 and the glucose on the B-met was      129. Her hemoglobin A1C was checked during her hospital stay and it was      6.1, which is within normal limits. Given that her blood sugars dropped      substantially following the change to the oral steroids and that she has      been tapered off the steroids, she will not be discharged on insulin at      this time. She will be discharged on a modified carbohydrate diet.  4.  HYPOKALEMIA:  Her potassium was replaced prior to discharge.  5.  TOBACCO ABUSE:  The patient counseled to quit.   DISCHARGE MEDICATIONS:  1.  Avelox 400 mg 1 p.o. q.d. x7 days.  2.  Advair inhaler 1 puff b.i.d.  3.  Humibid DM 2 p.o. q. 12 hours.  4.  Claritin D 10 mg p.o. q.d.  5.  Lisinopril 20 mg 1 p.o. b.i.d.  6.  Prednisone taper as directed.  7.  Albuterol/Atrovent nebulizers q. 4 to 6 hours p.r.n.  8.  Protonix 40 mg 1 p.o. daily.   FOLLOW UP:  [facility] in 1 week.   DIET:  Modified carbohydrates.   CONDITION ON DISCHARGE:  Improved and stable.       ACV/MEDQ  D:  09/20/2004  T:  09/20/2004  Job:  [id number]   cc:   [facility]  [facility]

## 2011-03-26 NOTE — H&P (Signed)
NAMEHARTLEIGH, [name]            ACCOUNT NO.:  [id number]   MEDICAL RECORD NO.:  [id number]          PATIENT TYPE:  EMS   LOCATION:  MAJO                         FACILITY:  [facility]   PHYSICIAN:  [name], M.D. DATE OF BIRTH:  1951/09/08   DATE OF ADMISSION:  03/08/2006  DATE OF DISCHARGE:                                HISTORY & PHYSICAL   CHIEF COMPLAINT:  Shortness of breath.   PATIENT'S PRIMARY CARE PHYSICIAN:  __________   HISTORY OF PRESENT ILLNESS:  [name] is a 60 year old female who has  had multiple admissions in the past with COPD exacerbation, who states that  since approximately Saturday of last week she began to develop shortness of  breath.  Her symptoms progressed over the past 4 days.  She was actually  seen at urgent care facility on April 26.  She went on to state that over  the past 4 days her shortness of breath has progressed to the point that she  actually has to sit down to catch her breath when walking.  Her sinuses have  been draining, and her sense of taste and smell have decreased over the past  2 weeks.  She has been having a productive yellow-brownish mucus production  over the past several days, and whenever she coughs, she chokes.  Whenever  she lies down, she chokes; therefore, she has to sit up to breathe.  She  went on to state that last Friday she went fishing.  She later went to take  a shower and found a tick on her lower region of her abdomen.  She indicated  that stress triggers her shortness of breath and states that she has been  dealing with a significant amount of stress over the past several days  secondary to plans to move to [facility] She does not want to leave [place].  She has had a cough for the past 2-3 days.  Positive nausea.  Positive  emesis x1 yesterday and states this is primarily of phlegm.  She also  complains of night sweats accompanied by subjective fevers.  She states that  her grandson has also been  complaining of some shortness of breath and has  been requiring breathing treatments.  She went on to state that her symptoms  are very similar to those that she has experienced in the past requiring  hospitalizations.  She has been noncompliant with her medications secondary  to having no insurance.  She denies having any chest pain.   PAST MEDICAL HISTORY:  1.  There are multiple prior admissions in the e-chart stating that the      patient has been treated in the hospital for COPD exacerbations;      however, the patient states that she has never been actually told she      has COPD.  2.  Questionable history of asthma.  3.  Hyperglycemia secondary to steroids.  4.  Depression.  5.  Bronchitis.  6.  Hypertension.  7.  Anxiety.  8.  Depression.  9.  History of carcinoma of the right breast, status post  lumpectomy/chemotherapy/radiation in 1991.  10. History of right-sided musculoskeletal pain.   PAST SURGICAL HISTORY:  Hysterectomy.   ALLERGIES:  No known drug allergies.   FAMILY HISTORY:  The patient's mother died at the age of 71 secondary to  cervical cancer.  She died in Aug 15, 2005.  Father's medical history  is unknown.   SOCIAL HISTORY:  Cigarettes:  The patient started smoking in early 20s.  She  smoked up to one-half pack of cigarettes per day and still continues to  smoke approximately six cigarettes per day.  Alcohol:  The patient drinks  occasionally on weekends.   REVIEW OF SYSTEMS:  As per HPI.  Otherwise, all other systems are negative.   PHYSICAL EXAMINATION:  The patient is awake.  She speaks in full sentences.  She is currently sitting up and leaning over her dinner table.  She is not  using any accessory muscles and is currently __________.  She has an  occasional cough.  VITALS:  Temperature 98.5, blood pressure __________, heart rate 108,  respirations 24, O2 sat 98%.  HEENT:  Atraumatic, normocephalic.  Anicteric.  Extraocular muscles  are  intact.  __________. Oral mucosa is pink.  No exudates.  Poor dentition with  most teeth missing.  NECK:  No JVD.  No lymphadenopathy.  Thyroid is palpable.  CARDIAC:  S1 and S2 is present.  Regular rate and rhythm.  RESPIRATORY:  Breath sounds are decreased bilaterally.  No crackles or  wheezes.  The patient has already received at least two nebulized breathing  treatments and has also been started on IV steroids, however.  ABDOMEN:  Soft, nontender, nondistended.  Bowel sounds are present x4  quadrants.  No organomegaly.  There is a small area of excoriation within  the right lower quadrant which appears to be healing with a dark scab that  has formed over it on the distal right lower abdomen.  EXTREMITIES:  No edema.  The left hip has a tiny area of excoriation which  appears to be healing.  NEUROLOGICAL:  The patient is alert and oriented times three.  Cranial  nerves 2-12 are intact.  MUSCULOSKELETAL:  There is 5/5 in upper and lower extremity strength.   LABS:  Chest x-ray reveals no active cardiopulmonary disease.  COPD is seen.   ASSESSMENT AND PLAN:  [name] is a 60 year old female admitted numerous  times in the past for shortness of breath believed to be secondary to COPD  exacerbation.  1.  Acute asthma/chronic obstructive pulmonary disease exacerbation.  This      may have been precipitated by an upper respiratory tract infection.      Nebulized breathing treatments have been provided in the ER. We will      continue with these.  Would also continue the IV steroids that have been      initiated.  2.  Upper respiratory tract infection.  We will continue empiric antibiotics      that have been started.  3.  A recent tick bite.  The patient had been started on doxycycline 100 mg      p.o. b.i.d. on the 26th.  She states that she was not able to get her      medications filled until today.  We will go ahead and continue the     doxycycline that had been previously  been prescribed.  4.  Hypertension, uncontrolled.  We will start Norvasc 5 mg daily.  5.  Depression.  We  will monitor for now.  We will consider psychiatric      consultation if the patient's symptoms progress.  6.  Steroid-induced hyperglycemia.  We will start Accu-Cheks for now.  7.  History of cigarette abuse.  We will attempt to supply smoking cessation      counseling.  We will consider      prescribing a nicotine patch.  8.  A cough with lying down.  The patient could be having symptoms of GERD.      We will start Protonix.  9.  Deep venous thrombosis prophylaxis.  We will start on Lovenox.      [name], M.D.  Electronically Signed     OR/MEDQ  D:  03/08/2006  T:  03/08/2006  Job:  [id number]

## 2011-03-26 NOTE — H&P (Signed)
NAMESHARMEL, BALLANTINE            ACCOUNT NO.:  [id number]   MEDICAL RECORD NO.:  [id number]          PATIENT TYPE:  INP   LOCATION:  1828                         FACILITY:  [facility]   PHYSICIAN:  Sherin Quarry, MD      DATE OF BIRTH:  11-Nov-1950   DATE OF ADMISSION:  11/05/2005  DATE OF DISCHARGE:                                HISTORY & PHYSICAL   [name] is a 60 year old lady who has been admitted to [facility] on several occasions with acute exacerbations of chronic  bronchitis. She was last discharged from the hospital about a year ago. She  states that she has not had any of her medications for approximately the  last six months. For the last month, she states that she has been having a  severe cough which has been nonproductive and has been associated with  wheezing and chest congestion. She has had sweating at night. She does not  think that she has had any fever. Because of the paroxysms of coughing, she  has developed pain in the right side of her chest. She last presented to  Urgent Care about the 21st of this month and was given an albuterol inhaler  at that time. She returns to the emergency room on this date because of  increasing wheezing and inability to sleep at night. She states that she has  been using her grandson's albuterol nebulizer to try to get some relief. On  presentation to the emergency room, she was noted to have a temperature  98.9, heart rate of 100-120, O2 saturation was 98%. The chest x-ray showed  evidence of hyperinflation but no obvious infiltrates or signs of congestive  heart failure. She is admitted at this time for evaluation of acute  exacerbation of chronic bronchitis.   Medications at present are an albuterol metered-dose inhaler. Her medicines  previously have also included hydrochlorothiazide 25 milligrams daily. She  has also in the past taken Lotensin for her blood pressure. In the past, she  has also taken Singulair 10  milligrams daily and Advair 50-250 1 puffs  b.i.d. but she has run out of these medications several months ago.   ALLERGIES:  There are no known drug allergies.   OPERATIONS:  She has had a hysterectomy the past. She also had a lumpectomy  in 1991. Apparently, she did in fact have a breast cancer and was treated  with radiation and chemotherapy. She states that she has yearly mammograms  at the breast center.   MEDICAL ILLNESSES:  1.  History of hypertension.  2.  Depression.  3.  Past history of breast cancer as noted above.   FAMILY HISTORY:  The patient's mother had a history of breast cancer and  diabetes. She has two brothers who are drug addicts. She has a sister who  died of unknown causes and a brother who died of renal failure.   SOCIAL HISTORY:  The patient is married. She lives with her husband,  children and grandchildren. She works part-time for a [employer].  She drinks alcohol occasionally but not consistently and has no  history of  delirium tremens. She denies use of drugs. She smokes about one pack of  cigarettes per day. She has been repeatedly counseled about the importance  of discontinuing cigarette smoking.   REVIEW OF SYSTEMS:  HEAD:  She denies headache or dizziness. EYES:  She  denies visual blurring or diplopia. EAR/NOSE/THROAT:  Denies earache, sinus  pain or sore throat. CHEST:  See above. CARDIOVASCULAR:  Denies orthopnea,  PND or ankle edema. GI: There has been no recent problems with nausea or  vomiting. GU: Denies dysuria or urinary frequency. NEURO:  There is no  history of seizure or stroke.   PHYSICAL EXAM:  At this time, her blood pressure is 159/79, heart rate is  110, respirations were 20, O2 saturations 99%. HEENT exam is within normal  limits. The examination of the chest reveals diminished breath sounds  diffusely. There is marked expiratory wheezing which is also present  diffusely. There are few rhonchi noted at the right base.  Cardiovascular  reveals increased heart rate. Heart rhythm is regular. Normal S1-S2 without  rubs or gallops. The abdomen is benign. There are normal bowel sounds  without masses or tenderness. Neurologic testing and examination extremities  is normal.   IMPRESSION:  1.  Acute exacerbation of chronic bronchitis.  2.  Chronic obstructive pulmonary disease with a 50-pack-year smoking      history.  3.  History of breast cancer.  4.  Status post hysterectomy.  5.  History of depression.  6.  History of hypertension.   The patient will be admitted at this time for treatments to include oxygen,  nebulizer treatments, intravenous steroids, antibiotics. I will put her back  on treatment for hypertension. Clearly the main problem this patient has had  over the years is inadequate outpatient medical care but unfortunately this  has been difficult to arrange.           ______________________________  Sherin Quarry, MD     SY/MEDQ  D:  11/06/2005  T:  11/06/2005  Job:  [id number]

## 2011-03-26 NOTE — Cardiovascular Report (Signed)
[name], [name]            ACCOUNT NO.:  [id number]   MEDICAL RECORD NO.:  [id number]          PATIENT TYPE:  INP   LOCATION:  5527                         FACILITY:  [facility]   PHYSICIAN:  Carole Binning, M.D. LHCDATE OF BIRTH:  1951-10-08   DATE OF PROCEDURE:  08/31/2004  DATE OF DISCHARGE:                              CARDIAC CATHETERIZATION   PROCEDURES:  Left heart catheterization with coronary angiography and left  ventriculography.   INDICATIONS FOR PROCEDURE:  [name] is a 60 year old female who was  admitted by the hospitalists service.  She has asthma and has been having  chest pain.  She underwent a dobutamine Cardiolite which was interpreted as  revealing inferior ischemia.  She was, therefore, referred for cardiac  catheterization.   PROCEDURAL NOTE:  A 6-French sheath was placed in the right femoral artery.  Coronary angiography was performed with standard Judkins 6-French catheters.  Left ventriculography was performed with an angled pigtail catheter.  Contrast was Omnipaque.  There were no complications.   RESULTS:   HEMODYNAMICS:  Left ventricular pressure 145/18, aortic pressure 145/86.  There was no aortic valve gradient.   LEFT VENTRICULOGRAM:  The left ventricle was hyperdynamic.  Ejection  fraction is greater than 65%.  Mitral regurgitation was present, but this  was most likely secondary to ventricular ectopy.   CORONARY ANGIOGRAPHY:  (Right dominant)  1.  Left main is normal.  2.  Left anterior descending artery gives rise to two small diagonal      branches.  The LAD is normal.  3.  Left circumflex give rise to a large first obtuse marginal branch.  The      left circumflex is normal.  4.  Right coronary artery is a dominant vessel giving rise to a normal size      posterior descending artery and normal size posterolateral branch.      There is mild catheter-induced spasm in the ostium of the right coronary      artery.  The vessel was  otherwise normal.   IMPRESSIONS:  1.  Normal left ventricular systolic function.  2.  No significant coronary artery disease identified.       MWP/MEDQ  D:  08/31/2004  T:  08/31/2004  Job:  [id number]   cc:   Doylene Canning. Ladona Ridgel, M.D.   [facility] Hospitalists Service

## 2011-03-26 NOTE — Discharge Summary (Signed)
[name], [name]            ACCOUNT NO.:  [id number]   MEDICAL RECORD NO.:  [id number]          PATIENT TYPE:  INP   LOCATION:  6733                         FACILITY:  [facility]   PHYSICIAN:  Deirdre Peer. Polite, M.D. DATE OF BIRTH:  1951/03/12   DATE OF ADMISSION:  11/05/2005  DATE OF DISCHARGE:  11/09/2005                                 DISCHARGE SUMMARY   DISCHARGE DIAGNOSES:  1.  Chronic obstructive pulmonary disease exacerbation.  2.  History of medical noncompliance.  3.  Hyperglycemia secondary to steroids.  4.  Depression.   DISCHARGE MEDICATIONS:  1.  Lisinopril 10 mg daily.  2.  Albuterol and Atrovent nebulizers.  3.  Prednisone taper.  4.  Azithromycin 250 mg p.o. daily x3 days.  5.  Mucinex twice daily.  6.  Glyburide 2.5 mg daily, 10-day prescription provided.   DISPOSITION:  The patient is discharged home in stable condition and asked  to follow up at [facility].   HISTORY OF PRESENT ILLNESS:  [age]-year-old female with the above  medical problems presented to the ED for evaluation of shortness of breath  and cough.  The patient was felt to be having an acute exacerbation of COPD,  therefore admission was warranted for further evaluation and treatment.  Please see dictated H&P for further details.   PAST MEDICAL HISTORY:  Past medical history as stated above, also includes  past history of breast CA.   FAMILY HISTORY:  As stated in the admission H&P.   SOCIAL HISTORY:  As stated in the admission H&P.   HOSPITAL COURSE:  The patient was admitted to a medical floor bed for  evaluation and treatment of COPD exacerbation.  The patient was treated in  typical fashion with antibiotics, O2, nebulizers and steroids.  The  patient's x-rays were without acute infiltrate, but did show hyperinflated  lung fields.  The patient's hospital course was one of  slow, but continued improvement.  The patient was able to be transferred to  oral steroids as well as oral  antibiotics.  The patient was counseled on the  need to stop smoking.  The patient was discharged to home in stable  condition with continued prescriptions for COPD exacerbation, i.e., steroid  and antibiotics.      Deirdre Peer. Polite, M.D.  Electronically Signed     RDP/MEDQ  D:  01/04/2006  T:  01/04/2006  Job:  [id number]

## 2011-04-01 ENCOUNTER — Encounter: Payer: Self-pay | Admitting: Internal Medicine

## 2011-04-29 ENCOUNTER — Other Ambulatory Visit (INDEPENDENT_AMBULATORY_CARE_PROVIDER_SITE_OTHER): Payer: Medicare Other | Admitting: *Deleted

## 2011-04-29 DIAGNOSIS — J449 Chronic obstructive pulmonary disease, unspecified: Secondary | ICD-10-CM

## 2011-04-29 DIAGNOSIS — E785 Hyperlipidemia, unspecified: Secondary | ICD-10-CM

## 2011-04-29 MED ORDER — FLUTICASONE-SALMETEROL 250-50 MCG/DOSE IN AEPB: 1.0000 | INHALATION_SPRAY | Freq: Two times a day (BID) | RESPIRATORY_TRACT | Status: DC

## 2011-04-29 MED ORDER — TIOTROPIUM BROMIDE MONOHYDRATE 18 MCG IN CAPS: 18.0000 ug | ORAL_CAPSULE | Freq: Every day | RESPIRATORY_TRACT | Status: DC

## 2011-04-29 MED ORDER — FLUTICASONE PROPIONATE 50 MCG/ACT NA SUSP: 2.0000 | Freq: Every day | NASAL | Status: DC

## 2011-04-29 MED ORDER — ALBUTEROL SULFATE (2.5 MG/3ML) 0.083% IN NEBU: INHALATION_SOLUTION | RESPIRATORY_TRACT | Status: DC

## 2011-04-29 MED ORDER — PRAVASTATIN SODIUM 40 MG PO TABS: 40.0000 mg | ORAL_TABLET | Freq: Every day | ORAL | Status: DC

## 2011-04-29 NOTE — Telephone Encounter (Signed)
Faxed to [facility]. 

## 2011-04-29 NOTE — Telephone Encounter (Signed)
Rxs refill request forms faxed to [facility].

## 2011-04-29 NOTE — Telephone Encounter (Signed)
Faxed to [facility].

## 2011-04-29 NOTE — Telephone Encounter (Signed)
Albuterol nebs. Were requested as a refill but was not medlist so it was added; please check.

## 2011-05-28 ENCOUNTER — Other Ambulatory Visit: Payer: Self-pay | Admitting: Internal Medicine

## 2011-06-23 ENCOUNTER — Other Ambulatory Visit: Payer: Self-pay | Admitting: *Deleted

## 2011-06-23 DIAGNOSIS — F329 Major depressive disorder, single episode, unspecified: Secondary | ICD-10-CM

## 2011-06-23 MED ORDER — PRODIGY LANCETS 26G MISC: Status: DC

## 2011-06-23 MED ORDER — GLUCOSE BLOOD VI STRP: ORAL_STRIP | Status: DC

## 2011-06-24 MED ORDER — CITALOPRAM HYDROBROMIDE 40 MG PO TABS: 20.0000 mg | ORAL_TABLET | Freq: Every day | ORAL | Status: DC

## 2011-08-15 ENCOUNTER — Inpatient Hospital Stay (INDEPENDENT_AMBULATORY_CARE_PROVIDER_SITE_OTHER)
Admission: RE | Admit: 2011-08-15 | Discharge: 2011-08-15 | Disposition: A | Discharge status: Home / Self Care | Payer: Medicare Other | Source: Ambulatory Visit | Attending: Family Medicine | Admitting: Family Medicine

## 2011-08-15 DIAGNOSIS — H109 Unspecified conjunctivitis: Secondary | ICD-10-CM

## 2011-08-16 LAB — URINALYSIS, ROUTINE W REFLEX MICROSCOPIC
Glucose, UA: NEGATIVE
Protein, ur: NEGATIVE
Specific Gravity, Urine: 1.024
pH: 5

## 2011-08-16 LAB — I-STAT 8, (EC8 V) (CONVERTED LAB)
BUN: 12
Chloride: 107
pCO2, Ven: 39.7 — ABNORMAL LOW
pH, Ven: 7.448 — ABNORMAL HIGH

## 2011-08-16 LAB — BASIC METABOLIC PANEL
BUN: 6
BUN: 8
BUN: 9
CO2: 22
CO2: 24
CO2: 28
CO2: 34 — ABNORMAL HIGH
Calcium: 8.3 — ABNORMAL LOW
Chloride: 106
Chloride: 108
Creatinine, Ser: 0.64
GFR calc Af Amer: >60
GFR calc non Af Amer: >60
GFR calc non Af Amer: >60
GFR calc non Af Amer: >60
Glucose, Bld: 137 — ABNORMAL HIGH
Glucose, Bld: 271 — ABNORMAL HIGH
Potassium: 3.5
Potassium: 3.7
Potassium: 4.1
Sodium: 136
Sodium: 138

## 2011-08-16 LAB — URINE MICROSCOPIC-ADD ON

## 2011-08-16 LAB — CBC
HCT: 37.5
HCT: 37.5
HCT: 39.3
HCT: 42.8
Hemoglobin: 11.5 — ABNORMAL LOW
Hemoglobin: 13
Hemoglobin: 14.6
MCHC: 33.8
MCHC: 34.3
MCV: 87.6
MCV: 87.7
MCV: 87.8
MCV: 89
Platelets: 301
Platelets: 312
Platelets: 335
RBC: 3.74 — ABNORMAL LOW
RBC: 4.25
RBC: 4.88
RDW: 13.7
RDW: 14.1
WBC: 12.6 — ABNORMAL HIGH
WBC: 13.4 — ABNORMAL HIGH
WBC: 7.9

## 2011-08-16 LAB — DIFFERENTIAL
Basophils Absolute: 0.1
Basophils Relative: 1
Eosinophils Absolute: 0 — ABNORMAL LOW
Eosinophils Absolute: 0.4
Eosinophils Relative: 5
Lymphs Abs: 2.8
Monocytes Relative: 5
Neutrophils Relative %: 86 — ABNORMAL HIGH

## 2011-08-16 LAB — COMPREHENSIVE METABOLIC PANEL
Albumin: 3.9
BUN: 11
Calcium: 9.5
Glucose, Bld: 104 — ABNORMAL HIGH
Total Protein: 7.5

## 2011-08-16 LAB — POCT I-STAT CREATININE: Creatinine, Ser: 0.7

## 2011-08-16 LAB — INFLUENZA A+B VIRUS AG-DIRECT(RAPID): Influenza B Ag: NEGATIVE

## 2011-08-19 ENCOUNTER — Telehealth: Payer: Self-pay | Admitting: *Deleted

## 2011-08-19 NOTE — Telephone Encounter (Signed)
Returned pt 's call. Pt was wondering why her "mood medication" celexa was changed/dosage "cut in half" - from 1 tab to 1/2 tab.  I told her I did not know why but she needed to make an appt to talk to her PCP.   An appt was scheduled by front desk for 10/29 w/ Dr. [name].

## 2011-08-20 LAB — POCT URINALYSIS DIP (DEVICE)
Bilirubin Urine: NEGATIVE
Glucose, UA: 100 — AB
Ketones, ur: NEGATIVE
Nitrite: POSITIVE — AB
Operator id: 235561

## 2011-08-20 LAB — DIFFERENTIAL
Basophils Absolute: 0
Basophils Absolute: 0
Basophils Relative: 0
Basophils Relative: 0
Basophils Relative: 0
Eosinophils Absolute: 0.4
Eosinophils Relative: 4
Lymphocytes Relative: 16
Lymphocytes Relative: 35
Lymphocytes Relative: 41
Lymphs Abs: 1.2
Lymphs Abs: 2
Monocytes Absolute: 0.3
Monocytes Absolute: 1.1 — ABNORMAL HIGH
Monocytes Relative: 13 — ABNORMAL HIGH
Monocytes Relative: 9
Neutro Abs: 2
Neutro Abs: 3
Neutro Abs: 8.8 — ABNORMAL HIGH
Neutrophils Relative %: 52
Neutrophils Relative %: 68
Neutrophils Relative %: 80 — ABNORMAL HIGH

## 2011-08-20 LAB — CBC
HCT: 34.8 — ABNORMAL LOW
HCT: 40.9
Hemoglobin: 11.9 — ABNORMAL LOW
Hemoglobin: 13.6
Hemoglobin: 13.6
MCHC: 33.3
MCHC: 34.2
MCV: 87.6
Platelets: 370
Platelets: 376
Platelets: 388
RBC: 4
RBC: 4.66
RDW: 12.4
RDW: 12.9
WBC: 5.7
WBC: 8.3

## 2011-08-20 LAB — BASIC METABOLIC PANEL
BUN: 2 — ABNORMAL LOW
BUN: 22
BUN: 3 — ABNORMAL LOW
CO2: 31
Calcium: 9.4
Calcium: 9.4
Creatinine, Ser: 0.63
Creatinine, Ser: 0.9
GFR calc Af Amer: >60
GFR calc Af Amer: >60
GFR calc non Af Amer: >60
GFR calc non Af Amer: >60
GFR calc non Af Amer: >60
Glucose, Bld: 100 — ABNORMAL HIGH
Sodium: 142

## 2011-08-20 LAB — COMPREHENSIVE METABOLIC PANEL
AST: 11
Albumin: 3.3 — ABNORMAL LOW
Alkaline Phosphatase: 97
Chloride: 106
GFR calc Af Amer: >60
Potassium: 3.2 — ABNORMAL LOW
Total Bilirubin: 1.3 — ABNORMAL HIGH
Total Protein: 6.7

## 2011-08-20 LAB — URINE MICROSCOPIC-ADD ON

## 2011-08-20 LAB — CULTURE, BLOOD (ROUTINE X 2)
Culture: NO GROWTH
Culture: NO GROWTH

## 2011-08-20 LAB — URINE CULTURE

## 2011-08-20 LAB — URINALYSIS, ROUTINE W REFLEX MICROSCOPIC
Bilirubin Urine: NEGATIVE
Glucose, UA: NEGATIVE
Hgb urine dipstick: NEGATIVE
Protein, ur: 30 — AB
Specific Gravity, Urine: 1.008
Urobilinogen, UA: 1

## 2011-09-06 ENCOUNTER — Encounter: Payer: Medicare Other | Admitting: Internal Medicine

## 2011-09-27 ENCOUNTER — Encounter: Payer: Medicare Other | Admitting: Internal Medicine

## 2011-10-05 ENCOUNTER — Emergency Department (HOSPITAL_COMMUNITY): Payer: Medicare Other

## 2011-10-05 ENCOUNTER — Inpatient Hospital Stay (HOSPITAL_COMMUNITY)
Admission: EM | Admit: 2011-10-05 | Discharge: 2011-10-08 | DRG: 192 | Disposition: A | Discharge status: Home / Self Care | Payer: Medicare Other | Attending: Infectious Diseases | Admitting: Infectious Diseases

## 2011-10-05 ENCOUNTER — Other Ambulatory Visit: Payer: Self-pay

## 2011-10-05 ENCOUNTER — Encounter (HOSPITAL_COMMUNITY): Payer: Self-pay

## 2011-10-05 DIAGNOSIS — R509 Fever, unspecified: Secondary | ICD-10-CM

## 2011-10-05 DIAGNOSIS — IMO0002 Reserved for concepts with insufficient information to code with codable children: Secondary | ICD-10-CM

## 2011-10-05 DIAGNOSIS — E785 Hyperlipidemia, unspecified: Secondary | ICD-10-CM | POA: Diagnosis present

## 2011-10-05 DIAGNOSIS — R0602 Shortness of breath: Secondary | ICD-10-CM

## 2011-10-05 DIAGNOSIS — E119 Type 2 diabetes mellitus without complications: Secondary | ICD-10-CM | POA: Diagnosis present

## 2011-10-05 DIAGNOSIS — J441 Chronic obstructive pulmonary disease with (acute) exacerbation: Principal | ICD-10-CM | POA: Diagnosis present

## 2011-10-05 DIAGNOSIS — K219 Gastro-esophageal reflux disease without esophagitis: Secondary | ICD-10-CM | POA: Diagnosis present

## 2011-10-05 DIAGNOSIS — F17200 Nicotine dependence, unspecified, uncomplicated: Secondary | ICD-10-CM | POA: Diagnosis present

## 2011-10-05 DIAGNOSIS — R011 Cardiac murmur, unspecified: Secondary | ICD-10-CM

## 2011-10-05 DIAGNOSIS — J40 Bronchitis, not specified as acute or chronic: Secondary | ICD-10-CM | POA: Diagnosis present

## 2011-10-05 DIAGNOSIS — J449 Chronic obstructive pulmonary disease, unspecified: Secondary | ICD-10-CM

## 2011-10-05 DIAGNOSIS — Z79899 Other long term (current) drug therapy: Secondary | ICD-10-CM

## 2011-10-05 DIAGNOSIS — R000 Tachycardia, unspecified: Secondary | ICD-10-CM | POA: Diagnosis present

## 2011-10-05 DIAGNOSIS — E669 Obesity, unspecified: Secondary | ICD-10-CM | POA: Diagnosis present

## 2011-10-05 DIAGNOSIS — J069 Acute upper respiratory infection, unspecified: Secondary | ICD-10-CM | POA: Diagnosis present

## 2011-10-05 DIAGNOSIS — F411 Generalized anxiety disorder: Secondary | ICD-10-CM | POA: Diagnosis present

## 2011-10-05 DIAGNOSIS — R0789 Other chest pain: Secondary | ICD-10-CM | POA: Diagnosis present

## 2011-10-05 DIAGNOSIS — F329 Major depressive disorder, single episode, unspecified: Secondary | ICD-10-CM | POA: Diagnosis present

## 2011-10-05 DIAGNOSIS — Z853 Personal history of malignant neoplasm of breast: Secondary | ICD-10-CM

## 2011-10-05 DIAGNOSIS — I10 Essential (primary) hypertension: Secondary | ICD-10-CM | POA: Diagnosis present

## 2011-10-05 DIAGNOSIS — F3289 Other specified depressive episodes: Secondary | ICD-10-CM | POA: Diagnosis present

## 2011-10-05 HISTORY — DX: Constipation, unspecified: K59.00

## 2011-10-05 HISTORY — DX: Pneumonia, unspecified organism: J18.9

## 2011-10-05 HISTORY — DX: Cardiac murmur, unspecified: R01.1

## 2011-10-05 HISTORY — DX: Diarrhea, unspecified: R19.7

## 2011-10-05 HISTORY — DX: Shortness of breath: R06.02

## 2011-10-05 LAB — CBC
HCT: 37.4 % (ref 36.0–46.0)
Hemoglobin: 12.4 g/dL (ref 12.0–15.0)
MCV: 88.4 fL (ref 78.0–100.0)
Platelets: 265 10*3/uL (ref 150–400)
Platelets: 253 10*3/uL (ref 150–400)
RBC: 4.23 MIL/uL (ref 3.87–5.11)
RBC: 3.97 MIL/uL (ref 3.87–5.11)
WBC: 8.8 10*3/uL (ref 4.0–10.5)
WBC: 10.2 10*3/uL (ref 4.0–10.5)

## 2011-10-05 LAB — INFLUENZA PANEL BY PCR (TYPE A & B)
H1N1 flu by pcr: NOT DETECTED
Influenza B By PCR: NEGATIVE

## 2011-10-05 LAB — CREATININE, SERUM
Creatinine, Ser: 0.72 mg/dL (ref 0.50–1.10)
GFR calc Af Amer: >90 mL/min (ref >90)

## 2011-10-05 LAB — GLUCOSE, CAPILLARY: Glucose-Capillary: 461 mg/dL — ABNORMAL HIGH (ref 70–99)

## 2011-10-05 LAB — BASIC METABOLIC PANEL
BUN: 8 mg/dL (ref 6–23)
CO2: 26 mEq/L (ref 19–32)
Calcium: 8.9 mg/dL (ref 8.4–10.5)
Glucose, Bld: 143 mg/dL — ABNORMAL HIGH (ref 70–99)
Sodium: 137 mEq/L (ref 135–145)

## 2011-10-05 LAB — GLUCOSE, RANDOM: Glucose, Bld: 449 mg/dL — ABNORMAL HIGH (ref 70–99)

## 2011-10-05 LAB — DIFFERENTIAL
Eosinophils Relative: 0 % (ref 0–5)
Lymphocytes Relative: 13 % (ref 12–46)
Lymphs Abs: 1.2 10*3/uL (ref 0.7–4.0)
Monocytes Relative: 7 % (ref 3–12)

## 2011-10-05 LAB — CARDIAC PANEL(CRET KIN+CKTOT+MB+TROPI): CK, MB: 2.8 ng/mL (ref 0.3–4.0)

## 2011-10-05 MED ORDER — ENOXAPARIN SODIUM 40 MG/0.4ML ~~LOC~~ SOLN
40.0000 mg | SUBCUTANEOUS | Status: DC
  Administered 2011-10-05 – 2011-10-07 (×3): 40 mg via SUBCUTANEOUS
  Filled 2011-10-05 (×4): qty 0.4

## 2011-10-05 MED ORDER — ALBUTEROL SULFATE (5 MG/ML) 0.5% IN NEBU
INHALATION_SOLUTION | RESPIRATORY_TRACT | Status: AC
  Filled 2011-10-05: qty 1.5

## 2011-10-05 MED ORDER — ALBUTEROL SULFATE (5 MG/ML) 0.5% IN NEBU
2.5000 mg | INHALATION_SOLUTION | Freq: Four times a day (QID) | RESPIRATORY_TRACT | Status: DC
  Administered 2011-10-06 (×3): 2.5 mg via RESPIRATORY_TRACT
  Filled 2011-10-05 (×4): qty 0.5

## 2011-10-05 MED ORDER — ACETAMINOPHEN 650 MG RE SUPP: 650.0000 mg | Freq: Four times a day (QID) | RECTAL | Status: DC | PRN

## 2011-10-05 MED ORDER — DEXTROSE 5 % IV SOLN: 500.0000 mg | Freq: Once | INTRAVENOUS | Status: DC

## 2011-10-05 MED ORDER — SODIUM CHLORIDE 0.9 % IV SOLN
Freq: Once | INTRAVENOUS | Status: AC
  Administered 2011-10-05: 12:00:00 via INTRAVENOUS

## 2011-10-05 MED ORDER — ALBUTEROL SULFATE (5 MG/ML) 0.5% IN NEBU
2.5000 mg | INHALATION_SOLUTION | RESPIRATORY_TRACT | Status: DC | PRN
  Administered 2011-10-05 – 2011-10-07 (×2): 2.5 mg via RESPIRATORY_TRACT
  Filled 2011-10-05 (×2): qty 0.5

## 2011-10-05 MED ORDER — IPRATROPIUM BROMIDE 0.02 % IN SOLN
0.5000 mg | Freq: Once | RESPIRATORY_TRACT | Status: AC
  Administered 2011-10-05: 0.5 mg via RESPIRATORY_TRACT
  Filled 2011-10-05: qty 2.5

## 2011-10-05 MED ORDER — FLUTICASONE PROPIONATE 50 MCG/ACT NA SUSP
2.0000 | Freq: Every day | NASAL | Status: DC
  Administered 2011-10-05 – 2011-10-08 (×4): 2 via NASAL
  Filled 2011-10-05: qty 16

## 2011-10-05 MED ORDER — OSELTAMIVIR PHOSPHATE 75 MG PO CAPS
75.0000 mg | ORAL_CAPSULE | Freq: Two times a day (BID) | ORAL | Status: DC
  Administered 2011-10-05 – 2011-10-06 (×2): 75 mg via ORAL
  Filled 2011-10-05 (×3): qty 1

## 2011-10-05 MED ORDER — INSULIN ASPART 100 UNIT/ML ~~LOC~~ SOLN
0.0000 [IU] | SUBCUTANEOUS | Status: DC
  Administered 2011-10-06: 2 [IU] via SUBCUTANEOUS
  Administered 2011-10-06: 9 [IU] via SUBCUTANEOUS
  Administered 2011-10-06: 5 [IU] via SUBCUTANEOUS
  Administered 2011-10-06 (×2): 7 [IU] via SUBCUTANEOUS
  Administered 2011-10-06: 3 [IU] via SUBCUTANEOUS
  Administered 2011-10-07: 2 [IU] via SUBCUTANEOUS
  Administered 2011-10-07: 1 [IU] via SUBCUTANEOUS
  Filled 2011-10-05 (×13): qty 3

## 2011-10-05 MED ORDER — ACETAMINOPHEN 325 MG PO TABS
650.0000 mg | ORAL_TABLET | Freq: Four times a day (QID) | ORAL | Status: DC | PRN
  Administered 2011-10-07 – 2011-10-08 (×2): 650 mg via ORAL
  Filled 2011-10-05 (×2): qty 2

## 2011-10-05 MED ORDER — SODIUM CHLORIDE 0.9 % IV BOLUS (SEPSIS)
1000.0000 mL | Freq: Once | INTRAVENOUS | Status: AC
  Administered 2011-10-05: 1000 mL via INTRAVENOUS

## 2011-10-05 MED ORDER — METHYLPREDNISOLONE SODIUM SUCC 125 MG IJ SOLR
125.0000 mg | Freq: Once | INTRAMUSCULAR | Status: AC
  Administered 2011-10-05: 125 mg via INTRAVENOUS
  Filled 2011-10-05: qty 2

## 2011-10-05 MED ORDER — KETOROLAC TROMETHAMINE 30 MG/ML IJ SOLN
30.0000 mg | Freq: Once | INTRAMUSCULAR | Status: AC
  Administered 2011-10-05: 30 mg via INTRAVENOUS
  Filled 2011-10-05: qty 1

## 2011-10-05 MED ORDER — ACETAMINOPHEN 325 MG PO TABS
650.0000 mg | ORAL_TABLET | Freq: Once | ORAL | Status: AC
  Administered 2011-10-05: 650 mg via ORAL
  Filled 2011-10-05: qty 2

## 2011-10-05 MED ORDER — ZOLPIDEM TARTRATE 5 MG PO TABS
5.0000 mg | ORAL_TABLET | Freq: Every evening | ORAL | Status: DC | PRN
  Administered 2011-10-06 (×2): 5 mg via ORAL
  Filled 2011-10-05 (×2): qty 1

## 2011-10-05 MED ORDER — IPRATROPIUM BROMIDE 0.02 % IN SOLN
0.5000 mg | Freq: Four times a day (QID) | RESPIRATORY_TRACT | Status: DC
  Administered 2011-10-05 – 2011-10-06 (×4): 0.5 mg via RESPIRATORY_TRACT
  Filled 2011-10-05 (×4): qty 2.5

## 2011-10-05 MED ORDER — CITALOPRAM HYDROBROMIDE 20 MG PO TABS
20.0000 mg | ORAL_TABLET | Freq: Every day | ORAL | Status: DC
  Administered 2011-10-05 – 2011-10-08 (×4): 20 mg via ORAL
  Filled 2011-10-05 (×4): qty 1

## 2011-10-05 MED ORDER — LEVOFLOXACIN 500 MG PO TABS
500.0000 mg | ORAL_TABLET | Freq: Every day | ORAL | Status: DC
  Administered 2011-10-05 – 2011-10-07 (×3): 500 mg via ORAL
  Filled 2011-10-05 (×4): qty 1

## 2011-10-05 MED ORDER — CARVEDILOL 6.25 MG PO TABS
6.2500 mg | ORAL_TABLET | Freq: Two times a day (BID) | ORAL | Status: DC
  Administered 2011-10-05 – 2011-10-08 (×7): 6.25 mg via ORAL
  Filled 2011-10-05 (×8): qty 1

## 2011-10-05 MED ORDER — MENTHOL 3 MG MT LOZG
1.0000 | LOZENGE | OROMUCOSAL | Status: DC | PRN
  Administered 2011-10-06: 3 mg via ORAL
  Filled 2011-10-05: qty 9

## 2011-10-05 MED ORDER — ONDANSETRON HCL 4 MG/2ML IJ SOLN
4.0000 mg | Freq: Once | INTRAMUSCULAR | Status: AC
  Administered 2011-10-05: 4 mg via INTRAVENOUS
  Filled 2011-10-05: qty 2

## 2011-10-05 MED ORDER — PREDNISONE 20 MG PO TABS
40.0000 mg | ORAL_TABLET | Freq: Every day | ORAL | Status: DC
  Administered 2011-10-06 – 2011-10-08 (×3): 40 mg via ORAL
  Filled 2011-10-05 (×4): qty 2

## 2011-10-05 MED ORDER — ONDANSETRON HCL 4 MG/2ML IJ SOLN
4.0000 mg | Freq: Four times a day (QID) | INTRAMUSCULAR | Status: DC | PRN
  Administered 2011-10-05: 4 mg via INTRAVENOUS
  Filled 2011-10-05: qty 2

## 2011-10-05 MED ORDER — SODIUM CHLORIDE 0.9 % IJ SOLN
3.0000 mL | Freq: Two times a day (BID) | INTRAMUSCULAR | Status: DC
  Administered 2011-10-05 – 2011-10-08 (×6): 3 mL via INTRAVENOUS

## 2011-10-05 MED ORDER — ONDANSETRON HCL 4 MG PO TABS: 4.0000 mg | ORAL_TABLET | Freq: Four times a day (QID) | ORAL | Status: DC | PRN

## 2011-10-05 MED ORDER — ALBUTEROL (5 MG/ML) CONTINUOUS INHALATION SOLN
7.5000 mg/h | INHALATION_SOLUTION | Freq: Once | RESPIRATORY_TRACT | Status: AC
  Administered 2011-10-05: 7.5 mg/h via RESPIRATORY_TRACT
  Filled 2011-10-05: qty 20

## 2011-10-05 MED ORDER — SIMVASTATIN 20 MG PO TABS
20.0000 mg | ORAL_TABLET | Freq: Every day | ORAL | Status: DC
  Administered 2011-10-05 – 2011-10-07 (×3): 20 mg via ORAL
  Filled 2011-10-05 (×4): qty 1

## 2011-10-05 MED ORDER — DEXTROSE 5 % IV SOLN
1.0000 g | Freq: Once | INTRAVENOUS | Status: DC
  Filled 2011-10-05: qty 10

## 2011-10-05 MED ORDER — MORPHINE SULFATE 2 MG/ML IJ SOLN
1.0000 mg | INTRAMUSCULAR | Status: DC | PRN
  Administered 2011-10-05 – 2011-10-07 (×8): 2 mg via INTRAVENOUS
  Filled 2011-10-05 (×8): qty 1

## 2011-10-05 MED ORDER — GUAIFENESIN-CODEINE 100-10 MG/5ML PO SOLN
10.0000 mL | ORAL | Status: DC | PRN
  Administered 2011-10-05: 10 mL via ORAL
  Administered 2011-10-06: 5 mL via ORAL
  Administered 2011-10-06 – 2011-10-07 (×4): 10 mL via ORAL
  Filled 2011-10-05 (×2): qty 5
  Filled 2011-10-05: qty 10
  Filled 2011-10-05: qty 5
  Filled 2011-10-05: qty 10
  Filled 2011-10-05 (×4): qty 5

## 2011-10-05 MED ORDER — LEVOFLOXACIN 500 MG PO TABS
500.0000 mg | ORAL_TABLET | ORAL | Status: AC
  Filled 2011-10-05: qty 1

## 2011-10-05 NOTE — ED Notes (Signed)
Pt. Is very weak, sob and cough with a fever, actively vomiting in triage

## 2011-10-05 NOTE — H&P (Signed)
Internal Medicine Teaching Service Attending Note Date: 10/05/2011  Patient name: [name]  Medical record number: [id number]  Date of birth: June 29, 1951    This patient has been seen and discussed with the house staff. Please see their note for complete details. I concur with their findings with the following additions/corrections: Please see my note Johny Sax 10/05/2011, 4:04 PM

## 2011-10-05 NOTE — ED Provider Notes (Addendum)
History     CSN: [id number] Arrival date & time: 10/05/2011  7:01 AM   First MD Initiated Contact with Patient 10/05/11 [phone]      Chief Complaint  Patient presents with  . Fever    cough sob and n/v for 3 days    (Consider location/radiation/quality/duration/timing/severity/associated sxs/prior treatment) HPI Comments: Presents with 2-3 days of cough symptoms.  Patient states that she initially felt it was a sinus infection and she was taking over-the-counter cough and cold medicines to help with this.  She has grown progressively more short of breath despite using her albuterol treatments at home.  She is also started to have fevers and some associated nausea and vomiting.  She notes she's been unable to sleep lying flat the last 2 nights due to her cough and shortness of breath.  Patient complains of generalized malaise as well.  Patient is a 60 y.o. female presenting with fever. The history is provided by the patient. No language interpreter was used.  Fever Primary symptoms of the febrile illness include fever, fatigue, cough, wheezing, shortness of breath, nausea and vomiting. Primary symptoms do not include visual change, headaches, abdominal pain, diarrhea, dysuria, altered mental status, myalgias, arthralgias or rash. The current episode started 2 days ago. This is a new problem. The problem has been gradually worsening.    Past Medical History  Diagnosis Date  . Hyperlipidemia   . Hypertension   . Tobacco abuse   . COPD (chronic obstructive pulmonary disease)     History of multiple hospital admissions for exercabation   . Asthma   . Breast cancer 1991    s/p lumpectomy, chemotherapy and radiation therapy in 1991. Mammogram in 2007 was normal.  . Sigmoid diverticulitis 80/2008  . Anxiety   . Depression   . Obesity   . GERD (gastroesophageal reflux disease)     Past Surgical History  Procedure Date  . Abdominal hysterectomy   . Dobutamine stress echo 08/2004   Inferior ischemia, normal LV systolic function, no significant CAD    Family History  Problem Relation Age of Onset  . Cancer Mother     History  Substance Use Topics  . Smoking status: Current Some Day Smoker  . Smokeless tobacco: Not on file  . Alcohol Use:     OB History    Grav Para Term Preterm Abortions TAB SAB Ect Mult Living                  Review of Systems  Constitutional: Positive for fever and fatigue. Negative for chills.  HENT: Positive for congestion.   Eyes: Negative.  Negative for discharge and redness.  Respiratory: Positive for cough, shortness of breath and wheezing.   Cardiovascular: Negative.  Negative for chest pain.  Gastrointestinal: Positive for nausea and vomiting. Negative for abdominal pain and diarrhea.  Genitourinary: Negative.  Negative for dysuria and vaginal discharge.  Musculoskeletal: Negative.  Negative for myalgias, back pain and arthralgias.  Skin: Negative.  Negative for color change and rash.  Neurological: Negative.  Negative for syncope and headaches.  Hematological: Negative.  Negative for adenopathy.  Psychiatric/Behavioral: Negative.  Negative for confusion and altered mental status.  All other systems reviewed and are negative.    Allergies  Review of patient's allergies indicates no known allergies.  Home Medications   Current Outpatient Rx  Name Route Sig Dispense Refill  . ALBUTEROL SULFATE (2.5 MG/3ML) 0.083% IN NEBU  Inhale the contents of 1 vial via nebulizer by  mouth every 4-6 hours as needed for shortness of breath. 75 mL 10  . PRODIGY AUTOCODE BLOOD GLUCOSE DEVI Does not apply by Does not apply route.      Marland Kitchen CARVEDILOL 6.25 MG PO TABS  TAKE 1 TABLET BY MOUTH TWICE DAILY 60 tablet PRN  . CITALOPRAM HYDROBROMIDE 40 MG PO TABS Oral Take 0.5 tablets (20 mg total) by mouth daily. 30 tablet 3  . FLUTICASONE PROPIONATE 50 MCG/ACT NA SUSP Nasal Place 2 sprays into the nose daily. 16 g 5  . FLUTICASONE-SALMETEROL 250-50  MCG/DOSE IN AEPB Inhalation Inhale 1 puff into the lungs every 12 (twelve) hours. 1 each 5  . GLIPIZIDE XL 5 MG PO TB24  TAKE 1 TABLET BY MOUTH DAILY 30 tablet PRN  . GLUCOSE BLOOD VI STRP  Use as instructed 100 each 11  . GUAIFENESIN 600 MG PO TB12 Oral Take 1,200 mg by mouth 2 (two) times daily.      Marland Kitchen LISINOPRIL-HYDROCHLOROTHIAZIDE 20-25 MG PO TABS  TAKE 1 TABLET BY MOUTH DAILY 30 tablet PRN  . LORATADINE 10 MG PO TABS Oral Take 1 tablet (10 mg total) by mouth daily. 30 tablet 3  . PRAVASTATIN SODIUM 40 MG PO TABS Oral Take 1 tablet (40 mg total) by mouth daily. 30 tablet 5  . PRODIGY LANCETS 26G MISC  Use as directed. 100 each 11  . TIOTROPIUM BROMIDE MONOHYDRATE 18 MCG IN CAPS Inhalation Place 1 capsule (18 mcg total) into inhaler and inhale daily. 30 capsule 5  . TRAMADOL HCL 50 MG PO TABS Oral Take 50 mg by mouth every 6 (six) hours as needed.        BP 142/78  Pulse 130  Temp(Src) 103.5 F (39.7 C) (Oral)  Resp 12  SpO2 93%  Physical Exam  Constitutional: She is oriented to person, place, and time. She appears well-developed and well-nourished.  Non-toxic appearance. She does not have a sickly appearance.       Patient appears ill but nontoxic.  HENT:  Head: Normocephalic and atraumatic.  Eyes: Conjunctivae, EOM and lids are normal. Pupils are equal, round, and reactive to light. No scleral icterus.  Neck: Trachea normal and normal range of motion. Neck supple.  Cardiovascular: Regular rhythm, S1 normal, S2 normal and normal heart sounds.  Tachycardia present.   Pulmonary/Chest: Effort normal. No respiratory distress. She has decreased breath sounds. She has wheezes.       Patient with bilaterally decreased breath sounds and faint inspiratory and expiratory wheezing present.  Abdominal: Soft. Normal appearance. There is no tenderness. There is no rebound, no guarding and no CVA tenderness.  Musculoskeletal: Normal range of motion.  Neurological: She is alert and oriented to  person, place, and time. She has normal strength.  Skin: Skin is warm, dry and intact. No rash noted.  Psychiatric: She has a normal mood and affect. Her behavior is normal. Judgment and thought content normal.    ED Course  Procedures (including critical care time)  Results for orders placed during the hospital encounter of 10/05/11  CBC      Component Value Range   WBC 8.8  4.0 - 10.5 (K/uL)   RBC 4.23  3.87 - 5.11 (MIL/uL)   Hemoglobin 12.4  12.0 - 15.0 (g/dL)   HCT 19.1  47.8 - 29.5 (%)   MCV 88.4  78.0 - 100.0 (fL)   MCH 29.3  26.0 - 34.0 (pg)   MCHC 33.2  30.0 - 36.0 (g/dL)   RDW 13.0  11.5 - 15.5 (%)   Platelets 265  150 - 400 (K/uL)  DIFFERENTIAL      Component Value Range   Neutrophils Relative 80 (*) 43 - 77 (%)   Neutro Abs 7.0  1.7 - 7.7 (K/uL)   Lymphocytes Relative 13  12 - 46 (%)   Lymphs Abs 1.2  0.7 - 4.0 (K/uL)   Monocytes Relative 7  3 - 12 (%)   Monocytes Absolute 0.6  0.1 - 1.0 (K/uL)   Eosinophils Relative 0  0 - 5 (%)   Eosinophils Absolute 0.0  0.0 - 0.7 (K/uL)   Basophils Relative 0  0 - 1 (%)   Basophils Absolute 0.0  0.0 - 0.1 (K/uL)  BASIC METABOLIC PANEL      Component Value Range   Sodium 137  135 - 145 (mEq/L)   Potassium 3.6  3.5 - 5.1 (mEq/L)   Chloride 99  96 - 112 (mEq/L)   CO2 26  19 - 32 (mEq/L)   Glucose, Bld 143 (*) 70 - 99 (mg/dL)   BUN 8  6 - 23 (mg/dL)   Creatinine, Ser 5.78  0.50 - 1.10 (mg/dL)   Calcium 8.9  8.4 - 46.9 (mg/dL)   GFR calc non Af Amer 89 (*) >90 (mL/min)   GFR calc Af Amer >90  >90 (mL/min)   Dg Chest 2 View  10/05/2011  *RADIOLOGY REPORT*  Clinical Data: Hypertension, chest pain, cough, nausea, fever, remote history breast cancer  CHEST - 2 VIEW  Comparison: 03/11/2011  Findings: Upper-normal size of cardiac silhouette. Mediastinal contours and pulmonary vascularity normal. Minimal chronic peribronchial thickening. No pulmonary infiltrate, pleural effusion or pneumothorax. No pneumothorax. Surgical clips at  right axilla. Prior cervical spine fusion. Minimal scoliosis and osseous demineralization.  IMPRESSION: No acute abnormalities.  Original Report Authenticated By: Lollie Marrow, M.D.       MDM  Patient with COPD exacerbation on exam and has been given steroids nebulizer treatments for this.  On reassessment she still has decreased breath sounds bilaterally with wheezing present.  Her oxygen levels are in the low 90s.  She is persistently tachycardic to 110 but her blood pressures remained normal.  Her tachycardia is partially related to her fever which is improving but likely also due to her general illness.  Her chest x-ray is clear at this point in time but there is concern for possible community-acquired pneumonia that is not evident yet due to dehydration.  Given that consideration I will add on community-acquired pneumonia antibiotics.  I also consider this patient to be influenza and have ordered an influenza test but those results are not back at this time.  Given the patient's abnormal vital signs and persistent difficulty breathing I contacted her primary care physician which is the outpatient clinics for admission.        Nat Christen, MD 10/05/11 1110   Date: 10/05/2011  Rate: 124  Rhythm: sinus tachycardia  QRS Axis: normal  Intervals: normal  ST/T Wave abnormalities: normal  Conduction Disutrbances:none  Narrative Interpretation:   Old EKG Reviewed: unchanged from 08/01/2009 except difference in rate    Nat Christen, MD 10/05/11 1132

## 2011-10-05 NOTE — Progress Notes (Signed)
Patient ID: [name], female   DOB: 25-Sep-1951, 60 y.o.   MRN: [id number] Internal Medicine Teaching Service Attending Note Date: 10/05/2011  Patient name: [name]  Medical record number: [id number]  Date of birth: 1950-11-16   I have seen and evaluated [name] and discussed their care with the Residency Team.   60 year old female with past medical history significant for Asthma/COPD and diabetes mellitus who presents with 4 days of increased sinus congestion, shortness of breath, and cough. She reports that 4 days prior to arrival she had the "sniffles" followed by sinus congestion and with increased drainage and postnasal drip. She notes her daughter and grandson were both sick with similar symptoms. She had no relief with over-the-counter cough medications. She noticed some wheezing and subsequently began to use her home nebulizer treatments with minimal change in her symptoms. She notes her cough is predominantly dry but occasionally productive of clear to yellowish sputum. She denies hemoptysis. She admits to store throat as well as muscle and joint aches/pains. She admits to central chest pain as well as her bilateral ribs. She states this is not like the pain she experienced with her coronary disease; he believes her current symptoms are the result of excessive coughing. She states she experiences coughing fits that make it difficult for her to catch her breath. She is also experiencing some diffuse abdominal pain related to her coughing. She was noted to be febrile in ED but is currently afebrile. She continues to smoke since her 89s but denies smoking illicit substances.    Physical Exam: Blood pressure 142/78, pulse 100, temperature 99.2 F (37.3 C), temperature source Oral, resp. rate 27, SpO2 100.00%. BP 142/78  Pulse 100  Temp(Src) 99.2 F (37.3 C) (Oral)  Resp 27  SpO2 100%  General Appearance:    Alert, cooperative, no distress, appears stated age    Head:    Normocephalic, without obvious abnormality, atraumatic  Eyes:    PERRL, conjunctiva/corneas clear, EOM's intact         Throat:   Lips, mucosa, and tongue normal; teeth and gums normal although many teeth missing.   Neck:   Supple, symmetrical, trachea midline, no adenopathy;    thyroid:    Back:      no CVA tenderness  Lungs:     She has diffuse wheezes and rhonchi, with decreased air flow.   Chest Wall:    Mild tenderness R anterior   Heart:    Regular rate and rhythm, S1 and S2 normal, no murmur, rub   or gallop     Abdomen:     Soft, non-tender, bowel sounds active all four quadrants,    no masses, no organomegaly        Extremities:   Extremities normal, atraumatic, no cyanosis or edema  Pulses:   2+ and symmetric all extremities  Skin:   Skin color, texture, turgor normal, no rashes or lesions  Lymph nodes:   Cervical, supraclavicular, and axillary nodes normal  Neurologic:   CNII-XII intact, normal grossly in all extremities.      Lab results: Results for orders placed during the hospital encounter of 10/05/11 (from the past 24 hour(s))  CBC     Status: Normal   Collection Time   10/05/11  7:36 AM      Component Value Range   WBC 8.8  4.0 - 10.5 (K/uL)   RBC 4.23  3.87 - 5.11 (MIL/uL)   Hemoglobin 12.4  12.0 -  15.0 (g/dL)   HCT 16.1  09.6 - 04.5 (%)   MCV 88.4  78.0 - 100.0 (fL)   MCH 29.3  26.0 - 34.0 (pg)   MCHC 33.2  30.0 - 36.0 (g/dL)   RDW 40.9  81.1 - 91.4 (%)   Platelets 265  150 - 400 (K/uL)  DIFFERENTIAL     Status: Abnormal   Collection Time   10/05/11  7:36 AM      Component Value Range   Neutrophils Relative 80 (*) 43 - 77 (%)   Neutro Abs 7.0  1.7 - 7.7 (K/uL)   Lymphocytes Relative 13  12 - 46 (%)   Lymphs Abs 1.2  0.7 - 4.0 (K/uL)   Monocytes Relative 7  3 - 12 (%)   Monocytes Absolute 0.6  0.1 - 1.0 (K/uL)   Eosinophils Relative 0  0 - 5 (%)   Eosinophils Absolute 0.0  0.0 - 0.7 (K/uL)   Basophils Relative 0  0 - 1 (%)   Basophils  Absolute 0.0  0.0 - 0.1 (K/uL)  BASIC METABOLIC PANEL     Status: Abnormal   Collection Time   10/05/11  7:36 AM      Component Value Range   Sodium 137  135 - 145 (mEq/L)   Potassium 3.6  3.5 - 5.1 (mEq/L)   Chloride 99  96 - 112 (mEq/L)   CO2 26  19 - 32 (mEq/L)   Glucose, Bld 143 (*) 70 - 99 (mg/dL)   BUN 8  6 - 23 (mg/dL)   Creatinine, Ser 7.82  0.50 - 1.10 (mg/dL)   Calcium 8.9  8.4 - 95.6 (mg/dL)   GFR calc non Af Amer 89 (*) >90 (mL/min)   GFR calc Af Amer >90  >90 (mL/min)  INFLUENZA PANEL BY PCR     Status: Normal   Collection Time   10/05/11 12:43 PM      Component Value Range   Influenza A By PCR NEGATIVE  NEGATIVE    Influenza B By PCR NEGATIVE  NEGATIVE    H1N1 flu by pcr NOT DETECTED  NOT DETECTED     Imaging results:  Dg Chest 2 View  10/05/2011  *RADIOLOGY REPORT*  Clinical Data: Hypertension, chest pain, cough, nausea, fever, remote history breast cancer  CHEST - 2 VIEW  Comparison: 03/11/2011  Findings: Upper-normal size of cardiac silhouette. Mediastinal contours and pulmonary vascularity normal. Minimal chronic peribronchial thickening. No pulmonary infiltrate, pleural effusion or pneumothorax. No pneumothorax. Surgical clips at right axilla. Prior cervical spine fusion. Minimal scoliosis and osseous demineralization.  IMPRESSION: No acute abnormalities.  Original Report Authenticated By: Lollie Marrow, M.D.    Assessment and Plan: I agree with the formulated Assessment and Plan with the following changes:  COPD Bronchitis vs Viral infection DM Tobacco Use Will start her on steroids as well as anbx for bronchitis. Although she has a normal WBC and CXR, her high temperature is concerning. Her history foo recent sick contacts raises ? Of viral infection (although her flu PCR is negative). Will ask for respiratory virus panel to check for other viruses as well (corona, rsv).  Will check her HIV status Will watch her DM and supplement her home insulin as  needed. Will provide nicotine replacement and encourage her to stop smoking (although she is initially recalcitrant to this).

## 2011-10-05 NOTE — ED Notes (Signed)
All labs for flu have returned negative - isolation cart outside room for protective measures.

## 2011-10-05 NOTE — ED Notes (Signed)
Patient transported to X-ray 

## 2011-10-05 NOTE — ED Notes (Signed)
Requested respiratory therapy to give nebulizer treatments

## 2011-10-05 NOTE — H&P (Signed)
Hospital Admission Note Date: 10/05/2011  Patient name: [name] Medical record number: [id number] Date of birth: 06-07-1951 Age: 60 y.o. Gender: female PCP: Elyse Jarvis, MD  Medical Service: Internal medicine teaching service - [facility]  Attending physician:  Dr. Ninetta Lights    1st Contact: Dr. Yaakov Guthrie    Pager: [phone] 2nd Contact: Dr. Loistine Chance    Pager: [phone] After 5 pm or weekends: 1st Contact:      Pager: [phone] 2nd Contact:      Pager: [phone]  Chief Complaint: cough  History of Present Illness: Patient is a 60 year old female with past medical history significant for COPD and diabetes mellitus who presents with 4 days of increased sinus congestion, shortness of breath, and cough.  She reports that 4 days prior to arrival she had the "sniffles" followed by sinus congestion and with increased drainage and postnasal drip. She notes her daughter and grandson were both sick with similar symptoms. The patient purchased some over-the-counter cough medications that did not seem to help. She noticed some wheezing and subsequently began to use her home nebulizer treatments. She states she did bleed does not have to use her nebulizer but hasn't used it may rest times over the past few days with minimal change in her symptoms. She notes her cough is predominantly dry but occasionally productive of clear to yellowish sputum. She denies hemoptysis. She admits to store throat as well as muscle and joint aches/pains.  She admits to central chest pain as well as her bilateral ribs. She states this is not like the pain she experienced with her coronary disease; he believes her current symptoms are the result of excessive coughing. She states she experiences coughing fits that make it difficult for her to catch her breath. She is also experiencing some diffuse abdominal pain related to her coughing. She denies nausea, vomiting, diarrhea, bloody bowel movements, dysuria, hematuria, or other  complaints. She denies any recent travel in the states or abroad. She denies any TB exposure.  She admits to rigors for 2-3 days prior to admission; she did not check to see if she had a temperature at home.  Meds: Medications Prior to Admission  Medication Sig Dispense Refill  . albuterol (PROVENTIL) (2.5 MG/3ML) 0.083% nebulizer solution Inhale the contents of 1 vial via nebulizer by mouth every 4-6 hours as needed for shortness of breath.  75 mL  10  . Blood Glucose Monitoring Suppl (PRODIGY AUTOCODE BLOOD GLUCOSE) DEVI by Does not apply route.        . carvedilol (COREG) 6.25 MG tablet TAKE 1 TABLET BY MOUTH TWICE DAILY  60 tablet  PRN  . citalopram (CELEXA) 40 MG tablet Take 0.5 tablets (20 mg total) by mouth daily.  30 tablet  3  . fluticasone (FLONASE) 50 MCG/ACT nasal spray Place 2 sprays into the nose daily.  16 g  5  . Fluticasone-Salmeterol (ADVAIR DISKUS) 250-50 MCG/DOSE AEPB Inhale 1 puff into the lungs every 12 (twelve) hours.  1 each  5  . GLIPIZIDE XL 5 MG 24 hr tablet TAKE 1 TABLET BY MOUTH DAILY  30 tablet  PRN  . glucose blood (PRODIGY TEST) test strip Use as instructed  100 each  11  . guaiFENesin (MUCINEX) 600 MG 12 hr tablet Take 1,200 mg by mouth 2 (two) times daily.        Marland Kitchen lisinopril-hydrochlorothiazide (PRINZIDE,ZESTORETIC) 20-25 MG per tablet TAKE 1 TABLET BY MOUTH DAILY  30 tablet  PRN  . loratadine (CLARITIN) 10 MG tablet  Take 1 tablet (10 mg total) by mouth daily.  30 tablet  3  . pravastatin (PRAVACHOL) 40 MG tablet Take 1 tablet (40 mg total) by mouth daily.  30 tablet  5  . PRODIGY LANCETS 26G MISC Use as directed.  100 each  11  . tiotropium (SPIRIVA HANDIHALER) 18 MCG inhalation capsule Place 1 capsule (18 mcg total) into inhaler and inhale daily.  30 capsule  5  . traMADol (ULTRAM) 50 MG tablet Take 50 mg by mouth every 6 (six) hours as needed.          Allergies: Review of patient's allergies indicates no known allergies. Past Medical History  Diagnosis  Date  . Hyperlipidemia   . Hypertension   . Tobacco abuse   . COPD (chronic obstructive pulmonary disease)     History of multiple hospital admissions for exercabation   . Asthma   . Breast cancer 1991    s/p lumpectomy, chemotherapy and radiation therapy in 1991. Mammogram in 2007 was normal.  . Sigmoid diverticulitis 80/2008  . Anxiety   . Depression   . Obesity   . GERD (gastroesophageal reflux disease)    Past Surgical History  Procedure Date  . Abdominal hysterectomy   . Dobutamine stress echo 08/2004    Inferior ischemia, normal LV systolic function, no significant CAD   Family History  Problem Relation Age of Onset  . Cancer Mother    History   Social history: Patient is a current smoker.  She smokes approximately 1/2 a pack per day for more than 30 years.  Denies EtOH or  illicit drug use. Social History Narrative   Lives in [place] with her husband.Takes care of 3 grand children.Trying to find a job, has financial difficulties.   Review of Systems: Pertinent items are noted in HPI.  Physical Exam: Blood pressure 142/78, pulse 100, temperature 99.2 F (37.3 C), temperature source Oral, resp. rate 27, SpO2 100.00%. VItal signs reviewed and stable. GEN: Patient is diaphoretic and appears ill; she is in no acute distress.  Alert and oriented x 3.   HEENT: head is autraumatic and normocephalic.  Neck is supple without palpable masses or lymphadenopathy.  No JVD.  Vision intact.  EOMI.  PERRLA.  Sclerae anicteric.  Conjunctivae without pallor or injection. Mucous membranes are moist.  Hyperemia noted in oropharynx , no exudates, or other abnormal lesions.   RESP:  Lungs are clear to ascultation bilaterally with fair air movement.  No wheezes, ronchi, or rubs. CARDIOVASCULAR: regular rate, normal rhythm.  Distant but clear S1, S2, 3/6 systolic murmur, gallops, or rubs. ABDOMEN: soft, non-tender, non-distended.  Bowels sounds present in all quadrants and normoactive.  No  palpable masses. EXT: warm and dry.  Peripheral pulses equal, intact, and +2 globally.  No clubbing or cyanosis.  No edema in bilateral lower extremities. SKIN: warm and dry with normal turgor.  No rashes or abnormal lesions observed. NEURO:  No gross deficit.  Lab results: Basic Metabolic Panel:  [facility] 10/05/11 0736  NA 137  K 3.6  CL 99  CO2 26  GLUCOSE 143*  BUN 8  CREATININE 0.78  CALCIUM 8.9  MG --  PHOS --   CBC:  Basename 10/05/11 0736  WBC 8.8  NEUTROABS 7.0  HGB 12.4  HCT 37.4  MCV 88.4  PLT 265   Urine Drug Screen: Drugs of Abuse     Component Value Date/Time   LABOPIA NONE DETECTED 12/29/2009 0633   COCAINSCRNUR NONE DETECTED 12/29/2009  4098   LABBENZ NONE DETECTED 12/29/2009 0633   AMPHETMU NONE DETECTED 12/29/2009 0633   THCU NONE DETECTED 12/29/2009 0633   LABBARB  Value: NONE DETECTED        DRUG SCREEN FOR MEDICAL PURPOSES ONLY.  IF CONFIRMATION IS NEEDED FOR ANY PURPOSE, NOTIFY LAB WITHIN 5 DAYS.        LOWEST DETECTABLE LIMITS FOR URINE DRUG SCREEN Drug Class       Cutoff (ng/mL) Amphetamine      1000 Barbiturate      200 Benzodiazepine   200 Tricyclics       300 Opiates          300 Cocaine          300 THC              50 12/29/2009 1191    Imaging results:  Dg Chest 2 View  10/05/2011  *RADIOLOGY REPORT*  Clinical Data: Hypertension, chest pain, cough, nausea, fever, remote history breast cancer  CHEST - 2 VIEW  Comparison: 03/11/2011  Findings: Upper-normal size of cardiac silhouette. Mediastinal contours and pulmonary vascularity normal. Minimal chronic peribronchial thickening. No pulmonary infiltrate, pleural effusion or pneumothorax. No pneumothorax. Surgical clips at right axilla. Prior cervical spine fusion. Minimal scoliosis and osseous demineralization.  IMPRESSION: No acute abnormalities.  Original Report Authenticated By: Lollie Marrow, M.D.    Other results: EKG: Sinus tachycardia rate of right atrial enlargement. Normal intervals, normal  ST segments.  Assessment & Plan by Problem:  #Fever with cough: Patient presents with 4 days of progressive cough and fever.  She currently meets 2 of 4 criteria for sepsis but is currently hemodynamically stable and not in respiratory distress.  Her chest x-ray did not reveal evidence of pneumonia or other acute cardiopulmonary process. Her history and exam are not consistent with an acute COPD exacerbation.  Her revised Geneva score is 5 (HR>95bpm) consistent with intermediate risk for pulmonary embolus, although this seems less likely given her history.  At this point, the most likely etiology seems to be infection with influenza or other respiratory virus; infection with an atypical bacteria is also possible.  Her underlying diabetes and COPD greatly increase her risk of influenza infection and she has not received her annual vaccination. - Admit to telemetry - Droplet precautions - Tamiflu for empiric treatment of influenza - Will follow up results of influenza viral panel obtained in the emergency room - Will send sputum for Gram stain and culture, although it is unlikely her symptoms are the result of bacterial PNA - Check HIV - Consider empiric antibiotics if she decompensates - Cough syrup for symptomatic relief (codeine/guaifenesin) - Albuterol and Atrovent nebs - Follow up blood culture results - Tylenol for fever  #Tachycardia: Patient's tachycardia is likely the result of her fever.  Will continue to monitor on telemetry.  #Chest pain: Patient reports centralized chest pain as well as bilateral rib pain. It is most probable that her discomfort as a result of her severe coughing, however, she has history of coronary artery disease and continues to smoke. Her EKG is reassuring as there is no evidence of acute coronary ischemia or other concerning process.  - Will obtain one set of cardiac enzymes and cycle if troponin is elevated. - Morphine for pain control  #DM: will check HbA1c.   For now we'll manage hyperglycemia with sliding scale insulin.  Will resume glucotrol once she is able to sustain a regular diet.  #HTN: Will continue to  monitor and continue her home medications.  #Tobacco use: The patient declines nicotine patch at this time.  Will obtain a smoking cessation consult when she is feeling better.  #DVT prophylaxis: lovenox  Signed: Deicy Rusk 10/05/2011, 1:38 PM

## 2011-10-06 LAB — HIV ANTIBODY (ROUTINE TESTING W REFLEX): HIV: NONREACTIVE

## 2011-10-06 LAB — COMPREHENSIVE METABOLIC PANEL
ALT: 8 U/L (ref 0–35)
Calcium: 8.7 mg/dL (ref 8.4–10.5)
Creatinine, Ser: 0.6 mg/dL (ref 0.50–1.10)
GFR calc Af Amer: >90 mL/min (ref >90)
GFR calc non Af Amer: >90 mL/min (ref >90)
Glucose, Bld: 204 mg/dL — ABNORMAL HIGH (ref 70–99)
Sodium: 136 mEq/L (ref 135–145)
Total Protein: 7.1 g/dL (ref 6.0–8.3)

## 2011-10-06 LAB — DIFFERENTIAL
Basophils Relative: 0 % (ref 0–1)
Lymphocytes Relative: 16 % (ref 12–46)
Lymphs Abs: 1.7 10*3/uL (ref 0.7–4.0)
Monocytes Absolute: 0.5 10*3/uL (ref 0.1–1.0)
Monocytes Relative: 4 % (ref 3–12)
Neutro Abs: 8.6 10*3/uL — ABNORMAL HIGH (ref 1.7–7.7)
Neutrophils Relative %: 80 % — ABNORMAL HIGH (ref 43–77)

## 2011-10-06 LAB — HEMOGLOBIN A1C
Hgb A1c MFr Bld: 7.4 % — ABNORMAL HIGH (ref <5.7)
Mean Plasma Glucose: 166 mg/dL — ABNORMAL HIGH (ref <117)

## 2011-10-06 LAB — CBC
HCT: 32.7 % — ABNORMAL LOW (ref 36.0–46.0)
Hemoglobin: 10.9 g/dL — ABNORMAL LOW (ref 12.0–15.0)
MCHC: 33.3 g/dL (ref 30.0–36.0)
RBC: 3.69 MIL/uL — ABNORMAL LOW (ref 3.87–5.11)

## 2011-10-06 LAB — EXPECTORATED SPUTUM ASSESSMENT W GRAM STAIN, RFLX TO RESP C

## 2011-10-06 LAB — GLUCOSE, CAPILLARY
Glucose-Capillary: 209 mg/dL — ABNORMAL HIGH (ref 70–99)
Glucose-Capillary: 267 mg/dL — ABNORMAL HIGH (ref 70–99)
Glucose-Capillary: 326 mg/dL — ABNORMAL HIGH (ref 70–99)
Glucose-Capillary: 340 mg/dL — ABNORMAL HIGH (ref 70–99)

## 2011-10-06 MED ORDER — ALBUTEROL SULFATE (5 MG/ML) 0.5% IN NEBU
2.5000 mg | INHALATION_SOLUTION | Freq: Three times a day (TID) | RESPIRATORY_TRACT | Status: DC
  Administered 2011-10-06 – 2011-10-08 (×6): 2.5 mg via RESPIRATORY_TRACT
  Filled 2011-10-06 (×4): qty 0.5

## 2011-10-06 MED ORDER — POLYETHYLENE GLYCOL 3350 17 G PO PACK
17.0000 g | PACK | Freq: Every day | ORAL | Status: DC | PRN
  Administered 2011-10-06: 17 g via ORAL
  Filled 2011-10-06 (×2): qty 1

## 2011-10-06 MED ORDER — IPRATROPIUM BROMIDE 0.02 % IN SOLN
0.5000 mg | Freq: Three times a day (TID) | RESPIRATORY_TRACT | Status: DC
  Administered 2011-10-06 – 2011-10-08 (×6): 0.5 mg via RESPIRATORY_TRACT
  Filled 2011-10-06 (×6): qty 2.5

## 2011-10-06 NOTE — Progress Notes (Signed)
Internal Medicine Teaching Service Attending Note Date: 10/06/2011  Patient name: [name]  Medical record number: [id number]  Date of birth: [birth year]    This patient has been seen and discussed with the house staff. Please see their note for complete details. I concur with their findings with the following additions/corrections:  Johny Sax 10/06/2011, 11:30 AM

## 2011-10-06 NOTE — Progress Notes (Addendum)
Subjective: No acute events overnight. Patient states she slept poorly however, feels slightly improved today compared to yesterday. She says she is still is experiencing mildly productive cough with associated chest pain. Also endorses new throat soreness and dry lips that have been irritating her this morning. Objective: Vital signs in last 24 hours: Filed Vitals:   10/05/11 1759 10/05/11 2059 10/06/11 0300 10/06/11 0524  BP: 140/78 142/68  137/74  Pulse: 88 101  90  Temp: 99.1 F (37.3 C) 98.6 F (37 C)  98.6 F (37 C)  TempSrc: Oral Oral    Resp:  24  22  Height:   5\' 6"  (1.676 m)   Weight:   105.733 kg (233 lb 1.6 oz)   SpO2: 98% 96%  90%   Weight change:  No intake or output data in the 24 hours ending 10/06/11 0901 Physical Exam: General: Obese female lying in bed in no acute distress. Alert and appropriate. HEENT: Head is atraumatic and normocephalic. EOMI, anicteric sclerae without injection. Mucous membranes are moist. Poor dentention throughout. Oropharynx is slightly erythematous without exudates or lesions. Neck is supple without LAD or evidence of JVD. Respiratory: Normal work of breathing. Diminished air movement 2/2 cough with deep inspiration. Expiratory wheezes on anterior auscultation and greater on left than right on posterior auscultation. No crackles or ronchi. Cardiovascular: Regular rate and rhythm; 2/6 systolic murmur. Clear S1 and S2 without rubs or gallops. Abdomen: Soft, large but not distended. Normal bowel sounds. Tender to palpation on right costal margin and over right inferior ribs. No guarding or rebound tenderness, no organomegaly. Extremities: Warm and well perfused. No clubbing or cyanosis. No edema. Neuro: Grossly neuro intact.  Micro Results: Recent Results (from the past 240 hour(s))  CULTURE, BLOOD (ROUTINE X 2)     Status: Normal (Preliminary result)   Collection Time   10/05/11 11:30 AM      Component Value Range Status Comment   Specimen  Description BLOOD LEFT HAND   Final    Special Requests BOTTLES DRAWN AEROBIC AND ANAEROBIC 10CC   Final    Setup Time [id number]   Final    Culture     Final    Value:        BLOOD CULTURE RECEIVED NO GROWTH TO DATE CULTURE WILL BE HELD FOR 5 DAYS BEFORE ISSUING A FINAL NEGATIVE REPORT   Report Status PENDING   Incomplete   CULTURE, BLOOD (ROUTINE X 2)     Status: Normal (Preliminary result)   Collection Time   10/05/11 11:40 AM      Component Value Range Status Comment   Specimen Description BLOOD RIGHT HAND   Final    Special Requests BOTTLES DRAWN AEROBIC AND ANAEROBIC 10CC   Final    Setup Time [id number]   Final    Culture     Final    Value:        BLOOD CULTURE RECEIVED NO GROWTH TO DATE CULTURE WILL BE HELD FOR 5 DAYS BEFORE ISSUING A FINAL NEGATIVE REPORT   Report Status PENDING   Incomplete   CULTURE, SPUTUM-ASSESSMENT     Status: Normal   Collection Time   10/05/11  8:25 PM      Component Value Range Status Comment   Specimen Description SPUTUM   Final    Special Requests NONE   Final    Sputum evaluation     Final    Value: THIS SPECIMEN IS ACCEPTABLE. RESPIRATORY CULTURE REPORT TO FOLLOW.  Report Status 10/06/2011 FINAL   Final    Studies/Results: Dg Chest 2 View  10/05/2011  *RADIOLOGY REPORT*  Clinical Data: Hypertension, chest pain, cough, nausea, fever, remote history breast cancer  CHEST - 2 VIEW  Comparison: 03/11/2011  Findings: Upper-normal size of cardiac silhouette. Mediastinal contours and pulmonary vascularity normal. Minimal chronic peribronchial thickening. No pulmonary infiltrate, pleural effusion or pneumothorax. No pneumothorax. Surgical clips at right axilla. Prior cervical spine fusion. Minimal scoliosis and osseous demineralization.  IMPRESSION: No acute abnormalities.  Original Report Authenticated By: Lollie Marrow, M.D.   Medications: I have reviewed the patient's current medications. Scheduled Meds:   . sodium chloride   Intravenous Once    . albuterol  2.5 mg Nebulization Q6H  . carvedilol  6.25 mg Oral BID WC  . citalopram  20 mg Oral Daily  . enoxaparin  40 mg Subcutaneous Q24H  . fluticasone  2 spray Each Nare Daily  . insulin aspart  0-9 Units Subcutaneous Q4H  . ipratropium  0.5 mg Nebulization Q6H  . ketorolac  30 mg Intravenous Once  . levofloxacin  500 mg Oral QHS  . levofloxacin  500 mg Oral To ER  . oseltamivir  75 mg Oral BID  . predniSONE  40 mg Oral QAC breakfast  . simvastatin  20 mg Oral QHS  . sodium chloride  3 mL Intravenous Q12H  . DISCONTD: azithromycin (ZITHROMAX) 500 MG IVPB  500 mg Intravenous Once  . DISCONTD: cefTRIAXone (ROCEPHIN)  IV  1 g Intravenous Once   Continuous Infusions:  PRN Meds:.acetaminophen, acetaminophen, albuterol, guaiFENesin-codeine, menthol-cetylpyridinium, morphine, ondansetron (ZOFRAN) IV, ondansetron, zolpidem Assessment/Plan:  **Cough - Patient presented with fever to 103.5 initially, since has remained afebrile with a Tmax of 99.2. No leukocytosis but technically meets SIRS/sepsis criteria based on respiratory rate and tachycardia however is currently hemodynamically stable and in no distress not requiring any oxygen. Likely etiologies for her cough include COPD exacerbation vs. viral URI vs. pneumonia/bronchitis or a combination of all three. Her low Well's score makes a PE extremely unlikely and she does not have a history of CHF.  -- Will optimize COPD therapy with albuterol/ipratropium nebs; prednisone 40mg  daily, fluticasone nasal spray -- Continue levofloxacin for CAP coverage -- f/u on sputum/blood stains and cultures -- influenza panel negative- will d/c the Tamiflu -- Cough syrup for symptomatic relief; Tylenol for fever and aches -- HIV negative  **Chest Pain - Pt's pain likely 2/2 vigorous coughing since pain is exacerbated by movement and cough and is localized to right lower ribs. EKG and cardiac biomarkers negative for any ischemia.  -- d/c telemetry --  Continue morphine for pain control  **DM - HbA1c was 7.4 -- continue to manage hyperglycemia with SSI  **HTN - Currently well controlled from inpatient perspective -- continue home medications  **Tobacco use  -- will obtain smoking cessation consult before discharge once cough improves  **DVT prophylaxis -- lovenox   LOS: 1 day   Aleen Sells 10/06/2011, 9:01 AM   Intern Addendum: Agree with excellent MS3 note.  S: Patient is still coughing a lot this morning and about to get a breathing treatment which she hopes will help her a lot.  Cough productive of greenish sputum.  Afebrile overnight.  O:  Heart: Mild tachycardia, regular rhythm, no m/r/g. Lungs: Lots of cough.  Mild expiratory wheezing L > R.  No crackles.  Mild coarseness through.   Agree with rest of MS3 exam  A/P:  Viral URI / Possible COPD  Exacerbation: Continue prednisone, levofloxacin, nebulizers.  Continue cough suppressant/expectorant PRN.  Will fu result of respiratory virus panel.  Influenza panel negative, so tamiflu discontinued.    Chest Pain: No signs of ischemia on EKG, CEs negative.  No CAD on 2005 cardiac cath.  Pain seems to be the result of her extensive coughing for days.  Will continue morphine PRN for now.  Will make vitals Q6H.    Agree with rest of excellent MS3 assessment and plan.

## 2011-10-06 NOTE — Progress Notes (Signed)
Inpatient Diabetes Program Recommendations  AACE/ADA: New Consensus Statement on Inpatient Glycemic Control (2009)  Target Ranges:  Prepandial:   less than 140 mg/dL      Peak postprandial:   less than 180 mg/dL (1-2 hours)      Critically ill patients:  140 - 180 mg/dL   CBGs today: 045/ 409/ 161 mg/dl   Inpatient Diabetes Program Recommendations Correction (SSI): Please increase Novolog correction to Moderate scale tid ac + HS. Diet: Please change diet to Carbohydrate Modified Medium diet (currently ordered as Regular diet)  Noted pt got a one time dose of IV Solumedrol yesterday.  A1C 7.4%- decent control at home.

## 2011-10-06 NOTE — Progress Notes (Signed)
Pt had a CBG of 469 at 2050. Per protocol, MD notified and lab verification done. Lab verification was not complete until 2330. At this time, RN rechecked CBG and provided coverage. Idalia Needle RN.

## 2011-10-06 NOTE — Progress Notes (Signed)
Patient ID: [name], female   DOB: [birth year], 60 y.o.   MRN: [id number] Internal Medicine Teaching Service Attending Note Date: 10/06/2011  Patient name: [name]  Medical record number: [id number]  Date of birth: 06/17/1951    This patient has been seen and discussed with the house staff. Please see their note for complete details. I concur with their findings with the following additions/corrections: Pt contninues to feel poorly. Will cont her rx for CAP, await respiratory virus panel (her flu is already negative), await blood and sputum cx.  Johny Sax 10/06/2011, 11:27 AM

## 2011-10-07 LAB — GLUCOSE, CAPILLARY
Glucose-Capillary: 166 mg/dL — ABNORMAL HIGH (ref 70–99)
Glucose-Capillary: 93 mg/dL (ref 70–99)

## 2011-10-07 LAB — CBC
HCT: 32.6 % — ABNORMAL LOW (ref 36.0–46.0)
MCV: 89.1 fL (ref 78.0–100.0)
Platelets: 260 10*3/uL (ref 150–400)
RBC: 3.66 MIL/uL — ABNORMAL LOW (ref 3.87–5.11)
RDW: 13.1 % (ref 11.5–15.5)
WBC: 9.7 10*3/uL (ref 4.0–10.5)

## 2011-10-07 MED ORDER — HYDROCODONE-HOMATROPINE 5-1.5 MG/5ML PO SYRP
5.0000 mL | ORAL_SOLUTION | ORAL | Status: DC | PRN
  Administered 2011-10-08: 5 mL via ORAL
  Filled 2011-10-07: qty 5

## 2011-10-07 MED ORDER — INSULIN ASPART 100 UNIT/ML ~~LOC~~ SOLN
0.0000 [IU] | Freq: Three times a day (TID) | SUBCUTANEOUS | Status: DC
  Administered 2011-10-07: 5 [IU] via SUBCUTANEOUS
  Administered 2011-10-07: 15 [IU] via SUBCUTANEOUS
  Administered 2011-10-08: 11 [IU] via SUBCUTANEOUS
  Administered 2011-10-08: 2 [IU] via SUBCUTANEOUS

## 2011-10-07 MED ORDER — CHLORHEXIDINE GLUCONATE CLOTH 2 % EX PADS
6.0000 | MEDICATED_PAD | Freq: Every day | CUTANEOUS | Status: DC
  Administered 2011-10-07 – 2011-10-08 (×2): 6 via TOPICAL

## 2011-10-07 MED ORDER — PANTOPRAZOLE SODIUM 40 MG PO TBEC
40.0000 mg | DELAYED_RELEASE_TABLET | Freq: Every day | ORAL | Status: DC
  Administered 2011-10-07 – 2011-10-08 (×2): 40 mg via ORAL
  Filled 2011-10-07 (×2): qty 1

## 2011-10-07 MED ORDER — HYDROCHLOROTHIAZIDE 25 MG PO TABS
25.0000 mg | ORAL_TABLET | Freq: Every day | ORAL | Status: DC
  Administered 2011-10-07 – 2011-10-08 (×2): 25 mg via ORAL
  Filled 2011-10-07 (×2): qty 1

## 2011-10-07 MED ORDER — CYCLOBENZAPRINE HCL 5 MG PO TABS
5.0000 mg | ORAL_TABLET | Freq: Three times a day (TID) | ORAL | Status: DC | PRN
  Administered 2011-10-07 – 2011-10-08 (×3): 5 mg via ORAL
  Filled 2011-10-07: qty 0.5
  Filled 2011-10-07 (×3): qty 1

## 2011-10-07 MED ORDER — LISINOPRIL-HYDROCHLOROTHIAZIDE 20-25 MG PO TABS: 1.0000 | ORAL_TABLET | Freq: Every day | ORAL | Status: DC

## 2011-10-07 MED ORDER — MUPIROCIN 2 % EX OINT
1.0000 "application " | TOPICAL_OINTMENT | Freq: Two times a day (BID) | CUTANEOUS | Status: DC
  Administered 2011-10-07 – 2011-10-08 (×2): 1 via NASAL
  Filled 2011-10-07: qty 22

## 2011-10-07 MED ORDER — LISINOPRIL 20 MG PO TABS
20.0000 mg | ORAL_TABLET | Freq: Every day | ORAL | Status: DC
  Administered 2011-10-07 – 2011-10-08 (×2): 20 mg via ORAL
  Filled 2011-10-07 (×2): qty 1

## 2011-10-07 NOTE — Progress Notes (Addendum)
Subjective: Patient reports that her cough has decreased in frequency but is of the same quality and intensity and remains productive of green mucus. She continues to experience chest pain while coughing and notes some new back pain. Overall she feels that she has improved since admission and states she would like to ambulate today.  Objective: Vital signs in last 24 hours: Filed Vitals:   10/06/11 2100 10/07/11 0301 10/07/11 0500 10/07/11 0734  BP: 148/82  174/80   Pulse: 74 74 75   Temp: 98.2 F (36.8 C)  98.6 F (37 C)   TempSrc: Oral  Oral   Resp: 22 19 22    Height:      Weight:      SpO2: 95% 96% 99% 97%   Physical Exam: General: Obese female lying in bed in no acute distress. Alert and appropriate.  HEENT: Head is atraumatic and normocephalic. EOMI, anicteric sclerae without injection. Mucous membranes are moist. Poor dentention throughout. Oropharynx is slightly erythematous without exudates or lesions. Neck is supple without LAD or evidence of JVD.  Respiratory: Normal work of breathing. Diminished air movement 2/2 cough with deep inspiration. Expiratory wheezes on anterior auscultation and greater on left than right on posterior auscultation. No crackles or ronchi appreciated.  Cardiovascular: Regular rate and rhythm; 2/6 systolic murmur. Clear S1 and S2 without rubs or gallops.  Abdomen: Soft, large but not distended. Normal bowel sounds. Tender to palpation on right costal margin and over right inferior ribs. No guarding or rebound tenderness, no organomegaly.  Extremities: Warm and well perfused. No clubbing or cyanosis. No edema.  Neuro: Grossly neuro intact.  Lab Results: Basic Metabolic Panel:  Lab 10/06/11 4540 10/05/11 2217 10/05/11 2015 10/05/11 0736  NA 136 -- -- 137  K 4.0 -- -- 3.6  CL 101 -- -- 99  CO2 27 -- -- 26  GLUCOSE 204* 449* -- --  BUN 10 -- -- 8  CREATININE 0.60 -- 0.72 --  CALCIUM 8.7 -- -- 8.9  MG -- -- -- --  PHOS -- -- -- --   Liver  Function Tests:  Lab 10/06/11 0600  AST 15  ALT 8  ALKPHOS 90  BILITOT 0.4  PROT 7.1  ALBUMIN 2.8*   CBC:  Lab 10/07/11 0555 10/06/11 0600 10/05/11 0736  WBC 9.7 10.8* --  NEUTROABS -- 8.6* 7.0  HGB 10.7* 10.9* --  HCT 32.6* 32.7* --  MCV 89.1 88.6 --  PLT 260 246 --   Cardiac Enzymes:  Lab 10/05/11 2015  CKTOTAL 514*  CKMB 2.8  CKMBINDEX --  TROPONINI <0.30   CBG:  Lab 10/07/11 1131 10/07/11 0745 10/07/11 0454 10/07/11 0056 10/06/11 2018 10/06/11 1718  GLUCAP 237* 143* 93 166* 267* 322*   Hemoglobin A1C:  Lab 10/05/11 2015  HGBA1C 7.4*   Urine Drug Screen: Drugs of Abuse     Component Value Date/Time   LABOPIA NONE DETECTED 12/29/2009 0633   COCAINSCRNUR NONE DETECTED 12/29/2009 0633   LABBENZ NONE DETECTED 12/29/2009 0633   AMPHETMU NONE DETECTED 12/29/2009 0633   THCU NONE DETECTED 12/29/2009 0633   LABBARB  Value: NONE DETECTED        DRUG SCREEN FOR MEDICAL PURPOSES ONLY.  IF CONFIRMATION IS NEEDED FOR ANY PURPOSE, NOTIFY LAB WITHIN 5 DAYS.        LOWEST DETECTABLE LIMITS FOR URINE DRUG SCREEN Drug Class       Cutoff (ng/mL) Amphetamine      1000 Barbiturate      200  Benzodiazepine   200 Tricyclics       300 Opiates          300 Cocaine          300 THC              50 12/29/2009 5621       Micro Results: Recent Results (from the past 240 hour(s))  CULTURE, BLOOD (ROUTINE X 2)     Status: Normal (Preliminary result)   Collection Time   10/05/11 11:30 AM      Component Value Range Status Comment   Specimen Description BLOOD LEFT HAND   Final    Special Requests BOTTLES DRAWN AEROBIC AND ANAEROBIC 10CC   Final    Setup Time [id number]   Final    Culture     Final    Value:        BLOOD CULTURE RECEIVED NO GROWTH TO DATE CULTURE WILL BE HELD FOR 5 DAYS BEFORE ISSUING A FINAL NEGATIVE REPORT   Report Status PENDING   Incomplete   CULTURE, BLOOD (ROUTINE X 2)     Status: Normal (Preliminary result)   Collection Time   10/05/11 11:40 AM      Component  Value Range Status Comment   Specimen Description BLOOD RIGHT HAND   Final    Special Requests BOTTLES DRAWN AEROBIC AND ANAEROBIC 10CC   Final    Setup Time [id number]   Final    Culture     Final    Value:        BLOOD CULTURE RECEIVED NO GROWTH TO DATE CULTURE WILL BE HELD FOR 5 DAYS BEFORE ISSUING A FINAL NEGATIVE REPORT   Report Status PENDING   Incomplete   CULTURE, SPUTUM-ASSESSMENT     Status: Normal   Collection Time   10/05/11  8:25 PM      Component Value Range Status Comment   Specimen Description SPUTUM   Final    Special Requests NONE   Final    Sputum evaluation     Final    Value: THIS SPECIMEN IS ACCEPTABLE. RESPIRATORY CULTURE REPORT TO FOLLOW.   Report Status 10/06/2011 FINAL   Final   CULTURE, RESPIRATORY     Status: Normal (Preliminary result)   Collection Time   10/05/11  8:25 PM      Component Value Range Status Comment   Specimen Description SPUTUM   Final    Special Requests NONE   Final    Gram Stain     Final    Value: ABUNDANT WBC PRESENT, PREDOMINANTLY PMN     RARE SQUAMOUS EPITHELIAL CELLS PRESENT     FEW GRAM POSITIVE COCCI IN PAIRS AND CHAINS   Culture PENDING   Incomplete    Report Status PENDING   Incomplete   MRSA PCR SCREENING     Status: Abnormal   Collection Time   10/06/11 10:27 AM      Component Value Range Status Comment   MRSA by PCR POSITIVE (*) NEGATIVE  Final    Studies/Results: No results found. Medications: I have reviewed the patient's current medications. Scheduled Meds:   . albuterol  2.5 mg Nebulization TID  . carvedilol  6.25 mg Oral BID WC  . citalopram  20 mg Oral Daily  . enoxaparin  40 mg Subcutaneous Q24H  . fluticasone  2 spray Each Nare Daily  . hydrochlorothiazide  25 mg Oral Daily  . insulin aspart  0-15 Units Subcutaneous TID WC  .  ipratropium  0.5 mg Nebulization TID  . levofloxacin  500 mg Oral QHS  . levofloxacin  500 mg Oral To ER  . lisinopril  20 mg Oral Daily  . pantoprazole  40 mg Oral Q1200  .  predniSONE  40 mg Oral QAC breakfast  . simvastatin  20 mg Oral QHS  . sodium chloride  3 mL Intravenous Q12H  . DISCONTD: albuterol  2.5 mg Nebulization Q6H  . DISCONTD: insulin aspart  0-9 Units Subcutaneous Q4H  . DISCONTD: ipratropium  0.5 mg Nebulization Q6H  . DISCONTD: lisinopril-hydrochlorothiazide  1 tablet Oral Daily  . DISCONTD: oseltamivir  75 mg Oral BID   Continuous Infusions:  PRN Meds:.acetaminophen, acetaminophen, albuterol, cyclobenzaprine, HYDROcodone-homatropine, menthol-cetylpyridinium, ondansetron (ZOFRAN) IV, ondansetron, polyethylene glycol, zolpidem, DISCONTD: guaiFENesin-codeine, DISCONTD: morphine Assessment/Plan:  **Viral URI / Possible COPD Exacerbation: Patient presented with fever to 103.5 initially, since has remained afebrile with a Tmax of 99.2. Likely etiologies for her cough include COPD exacerbation vs. viral URI vs. pneumonia/bronchitis or a combination of all three. Her low Well's score makes a PE extremely unlikely and she does not have a history of CHF. -- Has remained afebrile over the past 48 hours and now without leukocytosis. Does not meet SIRS/sepsis criteria and is hemodynamically stable. Still has persistent productive cough although decreased in frequency today and pt reports feeling better -- Respiratory viral panel still pending; will f/u -- Sputum gram stain and culture unremarkable; blood cx negative to date -- Continue albuterol/iprotropium nebs, fluticasone nasal spray, prednisone 40mg  daily, and levofloxacin for COPD exacerbation treatment -- Continue cough syrup and Tylenol for symptom relief.  -- Will observe this afternoon and reassess her readiness to go home most likely tomorrow assuming she remains improved, afebrile, and not short of breath or hypoxic.  **Chest Pain/Back Pain: Chest pain present on admission with no EKG changes and cardiac biomarkers negative. Likely MSK 2/2 vigorous prolonged coughing. Today states she has new back  pain from coughing and being unable to lie flat without having coughing fits.  -- d/c morphine and continue tylenol for symptom relief -- add flexeril PRN for muscle spasm and muscle pain -- encourage pt to get out of bed and ambulate -- continue pantoprazole 40mg   **DM: HbA1c 7.4 on admission. Her glucose has been elevated since admission in the setting of an acute infection along with steroid use.  -- Increase SSI to moderate and monitor blood glucose  **HTN: BP's have been persistently elevated over the past 24 hours. -- continue carvedilol 6.25mg  BID -- start the rest of her home BP meds: HCTZ/lisinopril 25mg /20mg  daily -- continue simvastatin 20mg  daily  **Depression - Pt has hx of depression -- continue citalopram 20mg  daily  **Tobacco use  -- will obtain smoking cessation consult before discharge   **DVT prophylaxis  -- lovenox   LOS: 2 days   [name] 10/07/2011, 11:28 AM   GM Intern Addendum: Agree with entirety of above MS3 note, which I have edited.  I have seen and evaluated this patient today, and discussed the assessment and plan with our attending Dr. Ninetta Lights.  [name] is doing a bit better.  No SOB except during coughing spell.  Coughing spells less frequent.  Chest and abd pain from coughing continue.  Also having some back pain/spasms from laying in bed.    Exam: Lungs: Diffuse mild-mod wheezing.  Sufficient air movement but coughs when taking deep breath. Cardiovascular: Regular rate and rhythm; 2/6 systolic murmur. Clear S1 and S2 without  rubs or gallops.  Plan: Cont prednisone, levaquin, and nebs. Stop morphine, continue tylenol for pain control Switch cough syrup to Hycodan for better cough control Start flexeril for back pain in hospital bed Increase SSI to moderate Restart home HCTZ-Lisinopril for better BP control Nursing to ambulate patient

## 2011-10-07 NOTE — Progress Notes (Signed)
Patient ID: [name], female   DOB: [birth year], 60 y.o.   MRN: [id number] Internal Medicine Teaching Service Attending Note Date: 10/07/2011  Patient name: [name]  Medical record number: [id number]  Date of birth: 1951/01/23    This patient has been seen and discussed with the house staff. Please see their note for complete details. I concur with their findings with the following additions/corrections: Ms Mccrory appears to be improving. Will continue assessing her d/c needs.  Johny Sax 10/07/2011, 11:34 AM

## 2011-10-08 LAB — RESPIRATORY VIRUS PANEL
Adenovirus B: NOT DETECTED
Coronavirus229E: NOT DETECTED
CoronavirusOC43: NOT DETECTED
Coxsackie and Echovirus: NOT DETECTED
Metapneumovirus: NOT DETECTED
Parainfluenza 1: NOT DETECTED
Parainfluenza 2: NOT DETECTED
Parainfluenza 4: NOT DETECTED
Respiratory Syncytial Virus A: NOT DETECTED

## 2011-10-08 LAB — CULTURE, RESPIRATORY W GRAM STAIN

## 2011-10-08 MED ORDER — ACETAMINOPHEN 325 MG PO TABS: 650.0000 mg | ORAL_TABLET | Freq: Four times a day (QID) | ORAL | Status: AC | PRN

## 2011-10-08 MED ORDER — PREDNISONE 10 MG PO TABS: ORAL_TABLET | ORAL | Status: DC

## 2011-10-08 MED ORDER — HYDROCODONE-HOMATROPINE 5-1.5 MG/5ML PO SYRP: 5.0000 mL | ORAL_SOLUTION | Freq: Four times a day (QID) | ORAL | Status: AC | PRN

## 2011-10-08 MED ORDER — LEVOFLOXACIN 500 MG PO TABS: 500.0000 mg | ORAL_TABLET | Freq: Every day | ORAL | Status: AC

## 2011-10-08 MED ORDER — ALBUTEROL 90 MCG/ACT IN AERS: 2.0000 | INHALATION_SPRAY | Freq: Four times a day (QID) | RESPIRATORY_TRACT | Status: DC | PRN

## 2011-10-08 MED ORDER — CYCLOBENZAPRINE HCL 5 MG PO TABS: 5.0000 mg | ORAL_TABLET | Freq: Three times a day (TID) | ORAL | Status: AC | PRN

## 2011-10-08 NOTE — Progress Notes (Signed)
Patient ID: [name], female   DOB: 1951/08/02, 60 y.o.   MRN: [id number] Internal Medicine Teaching Service Attending Note Date: 10/08/2011  Patient name: [name]  Medical record number: [id number]  Date of birth: 08/30/1951    This patient has been seen and discussed with the house staff. Please see their note for complete details. I concur with their findings with the following additions/corrections: She appears to be improving- decreased pleuritic pain, improved sob. Still has some cough. She has multiple ?'s about MRSA. Will plan for d/c today with f/u in IM clinic.   Johny Sax 10/08/2011, 10:28 AM

## 2011-10-08 NOTE — Progress Notes (Signed)
Inpatient Diabetes Program Recommendations  AACE/ADA: New Consensus Statement on Inpatient Glycemic Control (2009)  Target Ranges:  Prepandial:   less than 140 mg/dL      Peak postprandial:   less than 180 mg/dL (1-2 hours)      Critically ill patients:  140 - 180 mg/dL   CBGs 16/10: 960/ 454/ 370/ 257 mg/dl CBGs today: 098/119 mg/dl  Inpatient Diabetes Program Recommendations Correction (SSI): Noted pt now on Novolog Moderate SSI. Insulin - Meal Coverage: Please add Novolog 6 units tid with meals while pt on Prednisone Diet: Please change diet to Carbohydrate Modified Medium diet (currently ordered as Regular diet)  Postprandial CBGs likely elevated due to PO Prednisone.

## 2011-10-08 NOTE — Discharge Summary (Signed)
Physician Discharge Summary  [name]  MRN: [id number]  DOB/AGE: [birth year] 60 y.o.  PCP: Christie Jarvis, MD  Admit date: 10/05/2011  Discharge date: 10/08/2011    Discharge Diagnoses:  Active Problems:  - Upper Respiratory Infection  - COPD  -Type II DM  - HTN  - Depression  - HLD - Tobacco Use - Depression - Anxiety - GERD  Current Discharge Medication List     START taking these medications    Details   acetaminophen (TYLENOL) 325 MG tablet  Take 2 tablets (650 mg total) by mouth every 6 (six) hours as needed (or Fever >/= 101).  Qty: 30 tablet     albuterol (PROVENTIL,VENTOLIN) 90 MCG/ACT inhaler  Inhale 2 puffs into the lungs every 6 (six) hours as needed for wheezing.  Qty: 17 g, Refills: 12     cyclobenzaprine (FLEXERIL) 5 MG tablet  Take 1 tablet (5 mg total) by mouth 3 (three) times daily as needed for muscle spasms.  Qty: 10 tablet, Refills: 0     HYDROcodone-homatropine (HYCODAN) 5-1.5 MG/5ML syrup  Take 5 mLs by mouth every 6 (six) hours as needed for cough.  Qty: 120 mL, Refills: 0     levofloxacin (LEVAQUIN) 500 MG tablet  Take 1 tablet (500 mg total) by mouth at bedtime.  Qty: 3 tablet, Refills: 0     predniSONE (DELTASONE) 10 MG tablet  Take three tablets (30mg ) once daily by mouth for three days (10/09/11 -> 10/11/11)  Then take two tablets (20mg ) once daily by mouth for three days (10/12/11 -> 10/14/11)  Then take one tablet (10mg ) once daily by mouth for three days (10/15/11 -> 10/17/11)  Then stop  Qty: 18 tablet, Refills: 0       CONTINUE these medications which have NOT CHANGED    Details   albuterol (PROVENTIL) (2.5 MG/3ML) 0.083% nebulizer solution  Inhale the contents of 1 vial via nebulizer by mouth every 4-6 hours as needed for shortness of breath.  Qty: 75 mL, Refills: 10    Associated Diagnoses: COPD (chronic obstructive pulmonary disease)     Blood Glucose Monitoring Suppl (PRODIGY AUTOCODE BLOOD GLUCOSE) DEVI  by Does not apply  route.     carvedilol (COREG) 6.25 MG tablet  TAKE 1 TABLET BY MOUTH TWICE DAILY  Qty: 60 tablet, Refills: PRN     citalopram (CELEXA) 40 MG tablet  Take 0.5 tablets (20 mg total) by mouth daily.  Qty: 30 tablet, Refills: 3    Associated Diagnoses: Depression     fluticasone (FLONASE) 50 MCG/ACT nasal spray  Place 2 sprays into the nose daily.  Qty: 16 g, Refills: 5    Associated Diagnoses: COPD (chronic obstructive pulmonary disease)     Fluticasone-Salmeterol (ADVAIR DISKUS) 250-50 MCG/DOSE AEPB  Inhale 1 puff into the lungs every 12 (twelve) hours.  Qty: 1 each, Refills: 5    Associated Diagnoses: COPD (chronic obstructive pulmonary disease)     GLIPIZIDE XL 5 MG 24 hr tablet  TAKE 1 TABLET BY MOUTH DAILY  Qty: 30 tablet, Refills: PRN     glucose blood (PRODIGY TEST) test strip  Use as instructed  Qty: 100 each, Refills: 11    Associated Diagnoses: Diabetes mellitus     guaiFENesin (MUCINEX) 600 MG 12 hr tablet  Take 1,200 mg by mouth 2 (two) times daily.     lisinopril-hydrochlorothiazide (PRINZIDE,ZESTORETIC) 20-25 MG per tablet  TAKE 1 TABLET BY MOUTH DAILY  Qty: 30 tablet, Refills: PRN  loratadine (CLARITIN) 10 MG tablet  Take 1 tablet (10 mg total) by mouth daily.  Qty: 30 tablet, Refills: 3    Associated Diagnoses: Allergic rhinitis     pravastatin (PRAVACHOL) 40 MG tablet  Take 1 tablet (40 mg total) by mouth daily.  Qty: 30 tablet, Refills: 5    Associated Diagnoses: Hyperlipidemia     PRODIGY LANCETS 26G MISC  Use as directed.  Qty: 100 each, Refills: 11    Associated Diagnoses: Diabetes mellitus     tiotropium (SPIRIVA HANDIHALER) 18 MCG inhalation capsule  Place 1 capsule (18 mcg total) into inhaler and inhale daily.  Qty: 30 capsule, Refills: 5    Associated Diagnoses: COPD (chronic obstructive pulmonary disease)     traMADol (ULTRAM) 50 MG tablet  Take 50 mg by mouth every 6 (six) hours as needed.        Discharge Condition: [name] was  discharged from [facility] in good condition and has a follow-up appointment with Dr. Tonny [name] on 10/22/11 at 3:45 PM in the [facility]. At that time her cough, if still present, will need to be addressed along with her COPD and suboptimally controlled DM.   Disposition: Home or Self Care  Significant Diagnostic Studies:  Dg Chest 2 View  10/05/2011 *RADIOLOGY REPORT* Clinical Data: Hypertension, chest pain, cough, nausea, fever, remote history breast cancer CHEST - 2 VIEW Comparison: 03/11/2011 Findings: Upper-normal size of cardiac silhouette. Mediastinal contours and pulmonary vascularity normal. Minimal chronic peribronchial thickening. No pulmonary infiltrate, pleural effusion or pneumothorax. No pneumothorax. Surgical clips at right axilla. Prior cervical spine fusion. Minimal scoliosis and osseous demineralization.  IMPRESSION: No acute abnormalities. Original Report Authenticated By: Lollie Marrow, M.D.  Microbiology:  Recent Results (from the past 240 hour(s))   CULTURE, BLOOD (ROUTINE X 2) Status: Normal (Preliminary result)    Collection Time    10/05/11 11:30 AM   Component  Value  Range  Status  Comment    Specimen Description  BLOOD LEFT HAND   Final     Special Requests  BOTTLES DRAWN AEROBIC AND ANAEROBIC 10CC   Final     Setup Time  [id number]   Final     Culture    Final     Value:  BLOOD CULTURE RECEIVED NO GROWTH TO DATE CULTURE WILL BE HELD FOR 5 DAYS BEFORE ISSUING A FINAL NEGATIVE REPORT    Report Status  PENDING   Incomplete    CULTURE, BLOOD (ROUTINE X 2) Status: Normal (Preliminary result)    Collection Time    10/05/11 11:40 AM   Component  Value  Range  Status  Comment    Specimen Description  BLOOD RIGHT HAND   Final     Special Requests  BOTTLES DRAWN AEROBIC AND ANAEROBIC 10CC   Final     Setup Time  [id number]   Final     Culture    Final     Value:  BLOOD CULTURE RECEIVED NO GROWTH TO DATE CULTURE WILL BE HELD FOR 5  DAYS BEFORE ISSUING A FINAL NEGATIVE REPORT    Report Status  PENDING   Incomplete    CULTURE, SPUTUM-ASSESSMENT Status: Normal    Collection Time    10/05/11 8:25 PM   Component  Value  Range  Status  Comment    Specimen Description  SPUTUM   Final     Special Requests  NONE   Final     Sputum evaluation  Final     Value:  THIS SPECIMEN IS ACCEPTABLE. RESPIRATORY CULTURE REPORT TO FOLLOW.    Report Status  10/06/2011 FINAL   Final    CULTURE, RESPIRATORY Status: Normal    Collection Time    10/05/11 8:25 PM   Component  Value  Range  Status  Comment    Specimen Description  SPUTUM   Final     Special Requests  NONE   Final     Gram Stain    Final     Value:  ABUNDANT WBC PRESENT, PREDOMINANTLY PMN     RARE SQUAMOUS EPITHELIAL CELLS PRESENT     FEW GRAM POSITIVE COCCI IN PAIRS AND CHAINS    Culture  NORMAL OROPHARYNGEAL FLORA   Final     Report Status  10/08/2011 FINAL   Final    MRSA PCR SCREENING Status: Abnormal    Collection Time    10/06/11 10:27 AM   Component  Value  Range  Status  Comment    MRSA by PCR  POSITIVE (*)  NEGATIVE  Final     Labs:  Results for orders placed during the hospital encounter of 10/05/11 (from the past 48 hour(s))   GLUCOSE, CAPILLARY Status: Abnormal    Collection Time    10/06/11 5:18 PM   Component  Value  Range  Comment    Glucose-Capillary  322 (*)  70 - 99 (mg/dL)    GLUCOSE, CAPILLARY Status: Abnormal    Collection Time    10/06/11 8:18 PM   Component  Value  Range  Comment    Glucose-Capillary  267 (*)  70 - 99 (mg/dL)    GLUCOSE, CAPILLARY Status: Abnormal    Collection Time    10/07/11 12:56 AM   Component  Value  Range  Comment    Glucose-Capillary  166 (*)  70 - 99 (mg/dL)     Comment 1  Notify RN      Comment 2  Documented in Chart     GLUCOSE, CAPILLARY Status: Normal    Collection Time    10/07/11 4:54 AM   Component  Value  Range  Comment    Glucose-Capillary  93  70 - 99 (mg/dL)    CBC Status: Abnormal     Collection Time    10/07/11 5:55 AM   Component  Value  Range  Comment    WBC  9.7  4.0 - 10.5 (K/uL)     RBC  3.66 (*)  3.87 - 5.11 (MIL/uL)     Hemoglobin  10.7 (*)  12.0 - 15.0 (g/dL)     HCT  16.1 (*)  09.6 - 46.0 (%)     MCV  89.1  78.0 - 100.0 (fL)     MCH  29.2  26.0 - 34.0 (pg)     MCHC  32.8  30.0 - 36.0 (g/dL)     RDW  04.5  40.9 - 15.5 (%)     Platelets  260  150 - 400 (K/uL)    GLUCOSE, CAPILLARY Status: Abnormal    Collection Time    10/07/11 7:45 AM   Component  Value  Range  Comment    Glucose-Capillary  143 (*)  70 - 99 (mg/dL)    GLUCOSE, CAPILLARY Status: Abnormal    Collection Time    10/07/11 11:31 AM   Component  Value  Range  Comment    Glucose-Capillary  237 (*)  70 - 99 (mg/dL)    GLUCOSE,  CAPILLARY Status: Abnormal    Collection Time    10/07/11 5:04 PM   Component  Value  Range  Comment    Glucose-Capillary  370 (*)  70 - 99 (mg/dL)    GLUCOSE, CAPILLARY Status: Abnormal    Collection Time    10/07/11 9:51 PM   Component  Value  Range  Comment    Glucose-Capillary  257 (*)  70 - 99 (mg/dL)    GLUCOSE, CAPILLARY Status: Abnormal    Collection Time    10/08/11 7:50 AM   Component  Value  Range  Comment    Glucose-Capillary  125 (*)  70 - 99 (mg/dL)    GLUCOSE, CAPILLARY Status: Abnormal    Collection Time    10/08/11 12:30 PM   Component  Value  Range  Comment    Glucose-Capillary  322 (*)  70 - 99 (mg/dL)     HPI :  Patient presents today with past medical history significant for COPD and type 2 diabetes mellitus who complains of 4 days of increased sinus congestion, cough and chest pain. She reports that 4 days prior to arrival she had the "sniffles" which were followed by sinus congestion with increased drainage and postnasal drip. She notes her daughter and grandson were both sick with similar symptoms before she developed any of these symptoms. The patient purchased some over-the-counter cough medications that did not seem to help alleviate her  symptoms. She subsequently noticed some wheezing and increased coughing and began to use her home nebulizer treatments. She states she normally does not have to use her nebulizer but she has used it a few times over the past few days albeit with minimal improvement in her symptoms. She notes her cough is predominantly dry but occasionally productive of clear to yellow/green sputum. She denies hemoptysis or blood tinged sputum. She admits to experiencing a sore throat as well as muscle and joint aches/pains.  She also complains of centrally located chest pain and upper abdominal pain that is worse on the right compared to the left. She states her current symptoms are the result of excessive and vigorous coughing from her illness. She reports the pain is most profound as she is coughing and taking deep breaths. She states she experiences coughing fits that make it difficult for her to catch her breath occasionally but denies difficulty breathing or shortness of breath when she is not having a coughing spell.  She denies nausea, vomiting, diarrhea, bloody bowel movements, dysuria, hematuria, or other complaints. She denies any recent travel in the states or abroad. She denies any TB exposure. She admits to rigors for 2-3 days prior to admission; she did not check to see if she had a temperature at home but would not doubt that she was febrile if she had been checked.     HOSPITAL COURSE BY PROBLEM:   **URI/COPD - Pt presented with productive cough and a fever of 103.5 with elevated RR qualifying her for SIRS/sepsis criteria but remained hemodynamically stable and non-hypoxic throughout her stay. After her initial temperature reading in the ED of 103.5, there was never another febrile episode. She tested negative for influenza, a CXR was benign, and blood/sputum cultures failed to reveal the etiology of her symptoms. Her COPD therapy was optimized with Albuterol/Ipratropium nebs, Prednisone 40mg  daily, in  addition to levofloxacin 500mg  daily to cover for any infection.  Cough was controlled at first with a codeine based cough syrup, and then this was switched to a hydrocodone  based syrup. By hospital day 3 her cough had improved significantly in terms of the frequency and intensity and although still present she reported feeling much better compared to admission.  She was successfully discharged on 10/08/11 after remaining afebrile and will continue her Prednisone with a plan to taper as indicated above, 3 days of levofloxacin, and continued advair and spiriva as per her prev home regimen. She will follow up as an outpatient on 09/22/11 with Dr. Tonny [name] to ensure resolution of her symptoms.   **Chest & Back Pain - Pt reports chest pain and back pain when she has coughing fits. EKG and cardiac biomarkers were negative for any ischemia and she was treated symptomatically.  Chest and abdomen tender to palpation.  Likely MSK strain from coughing.  She was discharged with flexeril in addition to her home tramadol.   **DM - Pt reports she takes only glipizide 5mg  at home to control her glucose. HgA1c was 7.4 on admission. While an inpatient her glipizide was held while she received sliding scale insulin. Her blood glucose remained elevated while in the hospital in the 200's and low 300's at times most likely secondary to receiving steroids. She will follow-up as an outpatient to determine the best regimen to adequately control her blood glucose. Her home regimen was unchanged on discharge.  **HTN - Pt has hx of HTN but had only slightly elevated BP's on admission and hospital day 1 so she received only carvedilol 6.25mg  BID through hospital day 2. On hospital day 3 her BP's were elevated into the 170's/80's and her other home medications of HCTZ 25mg  and Lisinopril 20mg  were restarted. She was discharged on these three medications which is unchanged from her previous anti-hypertensive regimen.   Discharge Exam:    Blood pressure 178/69, pulse 72, temperature 98.7 F (37.1 C), temperature source Oral, resp. rate 20, height 5\' 6"  (1.676 m), weight 105.733 kg (233 lb 1.6 oz), SpO2 95.00%.  General: Obese female lying in bed in no acute distress. Alert and appropriate.  HEENT: Head is atraumatic and normocephalic. EOMI, anicteric sclerae without injection. Mucous membranes are moist. Poor dentention throughout. Oropharynx is slightly erythematous without exudates or lesions. Neck is supple without LAD or evidence of JVD.  Respiratory: Normal work of breathing. Expiratory wheezes on anterior auscultation and greater on left than right on posterior auscultation. No crackles or ronchi appreciated.  Cardiovascular: Regular rate and rhythm; 2/6 systolic murmur. Clear S1 and S2 without rubs or gallops.  Abdomen: Soft, large but not distended. Normal bowel sounds. Tender to palpation on right costal margin and over right inferior ribs. No guarding or rebound tenderness, no organomegaly.  Extremities: Warm and well perfused. No clubbing or cyanosis. No edema.  Neuro: Grossly neuro intact.    Discharge Orders    Future Appointments:  Provider:  Department:  Dept Phone:  Center:    10/22/2011 3:45 PM  Lyn Hollingshead  [facility]  [phone]  [facility]      Future Orders  Please Complete By  Expires    Diet - low sodium heart healthy      Increase activity slowly      Call MD for: temperature >100.4      Call MD for: severe uncontrolled pain      Call MD for: difficulty breathing, headache or visual disturbances      Call MD for: persistant dizziness or light-headedness        Follow-up Information    Follow up with  PRIBULA,CHRISTOPHER on 10/22/2011. (appt at 3:45pm)    Contact information:    [address]  [place] [address]  [phone]

## 2011-10-11 LAB — CULTURE, BLOOD (ROUTINE X 2): Culture  Setup Time: 201211271820

## 2011-10-18 ENCOUNTER — Telehealth: Payer: Self-pay | Admitting: *Deleted

## 2011-10-18 NOTE — Telephone Encounter (Addendum)
Call from pt asking about her Prednisone.  Pt concerned about starting and the increase in her glucose levels that happen with Prednisone.  Spoke with D. Plyler Diabetes Educator.  Pt should monitor sugars ever 3-4 hours and to call if goes above 250.  Attempts to call pt.  No answer.  Message left to call the Clinics. Angelina Ok, RN 10/18/2011 3:03 PM. RTC from pt pt was asked to check her blood sugars every 3-4 hours as suggested by the Diabetes Educator and to call with sugars above 250.   Angelina Ok, RN 12.10.2012 4:13 PM

## 2011-10-18 NOTE — Telephone Encounter (Signed)
OK 

## 2011-10-21 ENCOUNTER — Telehealth: Payer: Self-pay | Admitting: *Deleted

## 2011-10-21 NOTE — Telephone Encounter (Signed)
Pt was instructed to call if blood sugars are greater than 250. States she checked her blood sugar at 1:30PM - it was 311 and she took a Glipizide 5mg  tablet.  Then she re-checked her bld sugar at 3:50PM - it was 315 and she took another Glipizide tablet. Today is the last day of taking 1.5 tab of Prednisone. And she will take 1 tablet x 3 days starting tomorrow.

## 2011-10-21 NOTE — Telephone Encounter (Signed)
Schedule for appt tomorrow

## 2011-10-21 NOTE — Telephone Encounter (Signed)
Talked to Dr Phillips Odor; pt needs an appt for tomorrow. Pt already has an appt scheduled tomorrow afternoon. Also instructed pt to continue monitoring her BS every 4 hrs. She agreed. Stated she will not take anymore Glipizide tab tonight; only that she panic when her BS went up.

## 2011-10-22 ENCOUNTER — Encounter: Payer: Self-pay | Admitting: Internal Medicine

## 2011-10-22 ENCOUNTER — Other Ambulatory Visit: Payer: Self-pay | Admitting: *Deleted

## 2011-10-22 ENCOUNTER — Ambulatory Visit (INDEPENDENT_AMBULATORY_CARE_PROVIDER_SITE_OTHER): Payer: Medicare Other | Admitting: Internal Medicine

## 2011-10-22 DIAGNOSIS — E119 Type 2 diabetes mellitus without complications: Secondary | ICD-10-CM

## 2011-10-22 DIAGNOSIS — J449 Chronic obstructive pulmonary disease, unspecified: Secondary | ICD-10-CM

## 2011-10-22 DIAGNOSIS — F3289 Other specified depressive episodes: Secondary | ICD-10-CM

## 2011-10-22 DIAGNOSIS — F329 Major depressive disorder, single episode, unspecified: Secondary | ICD-10-CM

## 2011-10-22 DIAGNOSIS — J4489 Other specified chronic obstructive pulmonary disease: Secondary | ICD-10-CM

## 2011-10-22 DIAGNOSIS — F32A Depression, unspecified: Secondary | ICD-10-CM

## 2011-10-22 DIAGNOSIS — I10 Essential (primary) hypertension: Secondary | ICD-10-CM

## 2011-10-22 MED ORDER — ACCU-CHEK AVIVA PLUS W/DEVICE KIT: 1.0000 | PACK | Freq: Two times a day (BID) | Status: DC

## 2011-10-22 MED ORDER — OMEPRAZOLE MAGNESIUM 20 MG PO TBEC: 20.0000 mg | DELAYED_RELEASE_TABLET | Freq: Every day | ORAL | Status: DC

## 2011-10-22 MED ORDER — CITALOPRAM HYDROBROMIDE 40 MG PO TABS: 40.0000 mg | ORAL_TABLET | Freq: Every day | ORAL | Status: DC

## 2011-10-22 MED ORDER — TIOTROPIUM BROMIDE MONOHYDRATE 18 MCG IN CAPS: 18.0000 ug | ORAL_CAPSULE | Freq: Every day | RESPIRATORY_TRACT | Status: DC

## 2011-10-22 NOTE — Assessment & Plan Note (Signed)
Lab Results  Component Value Date   NA 136 10/06/2011   K 4.0 10/06/2011   CL 101 10/06/2011   CO2 27 10/06/2011   BUN 10 10/06/2011   CREATININE 0.60 10/06/2011    BP Readings from Last 3 Encounters:  10/22/11 124/76  10/08/11 178/69  02/25/10 162/90    Assessment: Hypertension control:  controlled  Progress toward goals:  at goal Barriers to meeting goals:  no barriers identified  Plan: Hypertension treatment:  continue current medications

## 2011-10-22 NOTE — Assessment & Plan Note (Signed)
>>  ASSESSMENT AND PLAN FOR GAD (GENERALIZED ANXIETY DISORDER) WRITTEN ON 10/22/2011  4:46 PM BY PATEL, RAVI C, MD  I will increase the dose of Celexa to 40 mg daily today.

## 2011-10-22 NOTE — Assessment & Plan Note (Signed)
Lab Results  Component Value Date   HGBA1C 7.4* 10/05/2011   CREATININE 0.60 10/06/2011   CHOL  Value: 134        ATP III CLASSIFICATION:  <200     mg/dL   Desirable  161-096  mg/dL   Borderline High  >=045    mg/dL   High        4/0/9811   HDL 38* 07/11/2009   TRIG 44 07/11/2009    Last eye exam and foot exam: No results found for this basename: HMDIABEYEEXA, HMDIABFOOTEX    Assessment: Diabetes control: not controlled Progress toward goals: deteriorated Barriers to meeting goals: Nonadherence to diet-drinking much of regular soda lately.  Plan: Diabetes treatment: continue current medications. Advised to check blood glucose regularly- and call if goes more than 250 most of the time- after she's done with prednisone. I will recommend adding metformin 500 mg twice a day if needed for few months. She said that she would cut down on a regular soda and control her diet and doesn't want to add metformin now. I would give her a chance for diet and exercise until next A1c check as her diabetes was well controlled until last check. She verbalized understanding. Refer to: none Instruction/counseling given: reminded to bring blood glucose meter & log to each visit, reminded to bring medications to each visit, discussed the need for weight loss and discussed diet

## 2011-10-22 NOTE — Progress Notes (Signed)
  Subjective:    Patient ID: [name], female    DOB: [birth year], 60 y.o.   MRN: [id number]  HPI [name] is a pleasant 60 year old woman with past with history of DM 2, hypertension, COPD who comes the clinic for hospital followup visit and high sugars for past 2 days.  She was admitted for COPD exacerbation at the end of November 2012 and was discharged on steroid taper and antibiotic.  She started running high sugars for last 2 days- in low 300s and high 200s- and was worried about it. She called the clinic for that and was instructed to come to the clinic- which she already had an appointment today.  She has history of DM 2 and her last A1c was 7.6 during hospital admission. Before that it was 5.6. She is on glipizide 5 mg daily. She says that she is recently not religious in healthy diet- as drinking lot of regular soda-and that might reason for A1c going high.  She denies any fever, chills, nausea vomiting, abdominal pain, diarrhea, headache, palpitations, chest pain, short of breath. Her cough is getting better and she denies any wheezing.  She still has 5-6 more days of prednisone.  She is also worried about her being MRSA-positive in hospital- by a nasal swab- and was afraid of hurting her grand kids, for fear of passing infection. I explained her that she is probably appropriately treated in hospital with CHG baths and there is no contraindication to be close to her grand kids.  She also says that her depression is getting worse- and she is taking 20 mg of Celexa- and wants to increase the dose.    Review of Systems    as per history of present illness, all other systems reviewed and negative. Objective:   Physical Exam        Assessment & Plan:

## 2011-10-22 NOTE — Assessment & Plan Note (Signed)
I will increase the dose of Celexa to 40 mg daily today.

## 2011-10-22 NOTE — Patient Instructions (Signed)
Please make a followup appointment in 2 and half to 3 months or as needed. Start taking Prilosec 20 mg daily for next 2 months for reflux. Also increased dose of Celexa to 40 mg daily.  I have sent a prescription for new diabetes meter to [facility]. If there is any question call the clinic.  Check your sugars  2 times daily regularly, until your next visit. If his sugars run high even after you stop prednisone- to more than 250 regularly, give [place] a call for further management.Marland Kitchen

## 2011-10-22 NOTE — Assessment & Plan Note (Signed)
Recent hospital admission for COPD exacerbation. Completed course of antibiotics. Still on steroid taper- which increased her glucose for past 2 days.  Advised to continue to complete the steroid course- and give [place] a call if sugars are running too high.

## 2011-11-23 ENCOUNTER — Encounter (HOSPITAL_COMMUNITY): Payer: Self-pay | Admitting: Emergency Medicine

## 2011-11-23 ENCOUNTER — Emergency Department (INDEPENDENT_AMBULATORY_CARE_PROVIDER_SITE_OTHER)
Admission: EM | Admit: 2011-11-23 | Discharge: 2011-11-23 | Disposition: A | Discharge status: Home / Self Care | Payer: Medicare Other | Source: Home / Self Care | Attending: Family Medicine | Admitting: Family Medicine

## 2011-11-23 DIAGNOSIS — L0201 Cutaneous abscess of face: Secondary | ICD-10-CM | POA: Diagnosis not present

## 2011-11-23 DIAGNOSIS — L03211 Cellulitis of face: Secondary | ICD-10-CM

## 2011-11-23 NOTE — ED Provider Notes (Signed)
History     CSN: [id number]  Arrival date & time 11/23/11  1013   First MD Initiated Contact with Patient 11/23/11 1024      Chief Complaint  Patient presents with  . Rash    (Consider location/radiation/quality/duration/timing/severity/associated sxs/prior treatment) Patient is a 61 y.o. female presenting with rash. The history is provided by the patient.  Rash  This is a new problem. The current episode started more than 1 week ago. The problem has not changed since onset.The problem is associated with an unknown factor. There has been no fever. The rash is present on the face. The pain is mild. Associated symptoms include itching. She has tried steriods for the symptoms. The treatment provided no relief.    Past Medical History  Diagnosis Date  . Hyperlipidemia   . Hypertension   . Tobacco abuse   . COPD (chronic obstructive pulmonary disease)     History of multiple hospital admissions for exercabation   . Asthma   . Breast cancer 1991    s/p lumpectomy, chemotherapy and radiation therapy in 1991. Mammogram in 2007 was normal.  . Sigmoid diverticulitis 80/2008  . Anxiety   . Depression   . Obesity   . GERD (gastroesophageal reflux disease)   . Heart murmur 10/05/11    "first time I ever heard I had one was today"  . Pneumonia   . Shortness of breath 10/05/11    "at rest; lying down; w/exertion"  . Diabetes mellitus   . Bronchitis     h/o  . Diarrhea     h/o  . Constipated     h/o    Past Surgical History  Procedure Date  . Dobutamine stress echo 08/2004    Inferior ischemia, normal LV systolic function, no significant CAD  . Abdominal hysterectomy   . Breast surgery 1991    lumphectomy right breast  . Neck surgery 2012    "Dr. [name]  put plate in; did something to my vertebrae"    Family History  Problem Relation Age of Onset  . Cancer Mother     History  Substance Use Topics  . Smoking status: Current Some Day Smoker -- 0.5 packs/day for 40  years    Types: Cigarettes  . Smokeless tobacco: Never Used  . Alcohol Use: 2.4 oz/week    4 Cans of beer per week     "only on the weekends"    OB History    Grav Para Term Preterm Abortions TAB SAB Ect Mult Living                  Review of Systems  Constitutional: Negative.   Skin: Positive for itching and rash.    Allergies  Review of patient's allergies indicates no known allergies.  Home Medications   Current Outpatient Rx  Name Route Sig Dispense Refill  . HYDROCORTISONE 0.5 % EX CREA Topical Apply topically 2 (two) times daily.    . ALBUTEROL SULFATE (2.5 MG/3ML) 0.083% IN NEBU  Inhale the contents of 1 vial via nebulizer by mouth every 4-6 hours as needed for shortness of breath. 75 mL 10  . ALBUTEROL 90 MCG/ACT IN AERS Inhalation Inhale 2 puffs into the lungs every 6 (six) hours as needed for wheezing. 17 g 12  . ACCU-CHEK AVIVA PLUS W/DEVICE KIT Does not apply 1 each by Does not apply route 2 (two) times daily. 1 kit 0  . CARVEDILOL 6.25 MG PO TABS  TAKE 1 TABLET BY  MOUTH TWICE DAILY 60 tablet PRN  . CITALOPRAM HYDROBROMIDE 40 MG PO TABS Oral Take 1 tablet (40 mg total) by mouth daily. 30 tablet 3  . FLUTICASONE PROPIONATE 50 MCG/ACT NA SUSP Nasal Place 2 sprays into the nose daily. 16 g 5  . FLUTICASONE-SALMETEROL 250-50 MCG/DOSE IN AEPB Inhalation Inhale 1 puff into the lungs every 12 (twelve) hours. 1 each 5  . GLIPIZIDE XL 5 MG PO TB24  TAKE 1 TABLET BY MOUTH DAILY 30 tablet PRN  . GLUCOSE BLOOD VI STRP  Use as instructed 100 each 11  . GUAIFENESIN ER 600 MG PO TB12 Oral Take 1,200 mg by mouth 2 (two) times daily.      Marland Kitchen LISINOPRIL-HYDROCHLOROTHIAZIDE 20-25 MG PO TABS  TAKE 1 TABLET BY MOUTH DAILY 30 tablet PRN  . LORATADINE 10 MG PO TABS Oral Take 1 tablet (10 mg total) by mouth daily. 30 tablet 3  . OMEPRAZOLE MAGNESIUM 20 MG PO TBEC Oral Take 1 tablet (20 mg total) by mouth daily. 30 tablet 2  . PRAVASTATIN SODIUM 40 MG PO TABS Oral Take 1 tablet (40 mg  total) by mouth daily. 30 tablet 5  . PREDNISONE 10 MG PO TABS  Take three tablets (30mg ) once daily by mouth for three days (10/09/11 -> 10/11/11) Then take two tablets (20mg ) once daily by mouth for three days (10/12/11 -> 10/14/11) Then take one tablet (10mg ) once daily by mouth for three days (10/15/11 -> 10/17/11) Then stop 18 tablet 0  . PRODIGY LANCETS 26G MISC  Use as directed. 100 each 11  . TIOTROPIUM BROMIDE MONOHYDRATE 18 MCG IN CAPS Inhalation Place 1 capsule (18 mcg total) into inhaler and inhale daily. 30 capsule 3  . TRAMADOL HCL 50 MG PO TABS Oral Take 50 mg by mouth every 6 (six) hours as needed.        BP 131/64  Pulse 89  Temp(Src) 99.2 F (37.3 C) (Oral)  Resp 20  SpO2 100%  Physical Exam  Nursing note and vitals reviewed. Constitutional: She appears well-developed and well-nourished.  HENT:  Head: Normocephalic.  Right Ear: External ear normal.  Left Ear: External ear normal.  Mouth/Throat: Oropharynx is clear and moist.  Skin: Skin is warm and dry.       ED Course  Procedures (including critical care time)  Labs Reviewed - No data to display No results found.   1. Cellulitis of face       MDM          Barkley Bruns, MD 11/23/11 1108

## 2011-11-23 NOTE — ED Notes (Signed)
Rash to right side of face, area has a sore, and dry area.  Patient very anxious .  Reports area is irritating

## 2011-12-16 ENCOUNTER — Other Ambulatory Visit: Payer: Self-pay | Admitting: *Deleted

## 2011-12-16 MED ORDER — GLUCOSE BLOOD VI STRP: ORAL_STRIP | Status: DC

## 2011-12-16 MED ORDER — ACCU-CHEK FASTCLIX LANCETS MISC: 1.0000 | Freq: Two times a day (BID) | Status: DC

## 2011-12-16 NOTE — Telephone Encounter (Signed)
Lancets/Test strips refilled - rx request faxed to [facility].

## 2011-12-17 ENCOUNTER — Other Ambulatory Visit: Payer: Self-pay | Admitting: *Deleted

## 2011-12-17 DIAGNOSIS — J449 Chronic obstructive pulmonary disease, unspecified: Secondary | ICD-10-CM

## 2011-12-17 MED ORDER — FLUTICASONE PROPIONATE 50 MCG/ACT NA SUSP: 2.0000 | Freq: Every day | NASAL | Status: DC

## 2011-12-28 ENCOUNTER — Encounter: Payer: Medicare Other | Admitting: Internal Medicine

## 2012-01-10 ENCOUNTER — Other Ambulatory Visit: Payer: Self-pay | Admitting: Internal Medicine

## 2012-02-12 ENCOUNTER — Encounter (HOSPITAL_COMMUNITY): Payer: Self-pay | Admitting: *Deleted

## 2012-02-12 ENCOUNTER — Emergency Department (INDEPENDENT_AMBULATORY_CARE_PROVIDER_SITE_OTHER)
Admission: EM | Admit: 2012-02-12 | Discharge: 2012-02-12 | Disposition: A | Discharge status: Home / Self Care | Payer: Medicare Other | Source: Home / Self Care | Attending: Family Medicine | Admitting: Family Medicine

## 2012-02-12 DIAGNOSIS — J45909 Unspecified asthma, uncomplicated: Secondary | ICD-10-CM

## 2012-02-12 DIAGNOSIS — J45901 Unspecified asthma with (acute) exacerbation: Secondary | ICD-10-CM

## 2012-02-12 MED ORDER — ALBUTEROL SULFATE (5 MG/ML) 0.5% IN NEBU
5.0000 mg | INHALATION_SOLUTION | Freq: Once | RESPIRATORY_TRACT | Status: AC
  Administered 2012-02-12: 5 mg via RESPIRATORY_TRACT

## 2012-02-12 MED ORDER — GUAIFENESIN-CODEINE 100-10 MG/5ML PO SYRP: 5.0000 mL | ORAL_SOLUTION | Freq: Four times a day (QID) | ORAL | Status: DC | PRN

## 2012-02-12 MED ORDER — ALBUTEROL SULFATE (5 MG/ML) 0.5% IN NEBU
INHALATION_SOLUTION | RESPIRATORY_TRACT | Status: AC
  Filled 2012-02-12: qty 1

## 2012-02-12 MED ORDER — ALBUTEROL SULFATE HFA 108 (90 BASE) MCG/ACT IN AERS
INHALATION_SPRAY | RESPIRATORY_TRACT | Status: AC
  Filled 2012-02-12: qty 6.7

## 2012-02-12 MED ORDER — ALBUTEROL SULFATE HFA 108 (90 BASE) MCG/ACT IN AERS
2.0000 | INHALATION_SPRAY | RESPIRATORY_TRACT | Status: DC | PRN
  Administered 2012-02-12: 2 via RESPIRATORY_TRACT

## 2012-02-12 MED ORDER — PREDNISONE 20 MG PO TABS: 40.0000 mg | ORAL_TABLET | Freq: Every day | ORAL | Status: AC

## 2012-02-12 MED ORDER — IPRATROPIUM BROMIDE 0.02 % IN SOLN
0.5000 mg | Freq: Once | RESPIRATORY_TRACT | Status: AC
  Administered 2012-02-12: 0.5 mg via RESPIRATORY_TRACT

## 2012-02-12 NOTE — ED Provider Notes (Addendum)
History     CSN: [id number]  Arrival date & time 02/12/12  1121   First MD Initiated Contact with Patient 02/12/12 1132      Chief Complaint  Patient presents with  . Cough  . Nasal Congestion  . Shortness of Breath    (Consider location/radiation/quality/duration/timing/severity/associated sxs/prior treatment) HPI Comments: Taegen presents for evaluation of worsening dyspnea. She reports a history of asthma and is supposed to take Advair and Spiriva daily. However she admits that she only takes them both intermittently. She does not have an albuterol inhaler, and "does not know why." She does continue to smoke cigarettes. She denies any fever. She reports that she used to gets prednisone when "she gets like this". She also points out several hyperpigmented lesions over her skin, most notably on the right nasolabial fold. She also points out one on her upper right arm, which she would attribute to mosquito bite. She recounts a garrulous story about a diagnosis of MRSA after her last hospitalization, and therefore her anxiety over being diagnosed with MRSA. I reassured her that this does not look like MRSA today.  Patient is a 61 y.o. female presenting with cough and shortness of breath. The history is provided by the patient.  Cough This is a new problem. The current episode started more than 2 days ago. The problem occurs constantly. The problem has not changed since onset.The cough is productive of sputum. There has been no fever. Associated symptoms include chest pain, rhinorrhea, shortness of breath and wheezing. She is a smoker.  Shortness of Breath  Associated symptoms include chest pain, rhinorrhea, cough, shortness of breath and wheezing.    Past Medical History  Diagnosis Date  . Hyperlipidemia   . Hypertension   . Tobacco abuse   . COPD (chronic obstructive pulmonary disease)     History of multiple hospital admissions for exercabation   . Asthma   . Breast cancer 1991   s/p lumpectomy, chemotherapy and radiation therapy in 1991. Mammogram in 2007 was normal.  . Sigmoid diverticulitis 80/2008  . Anxiety   . Depression   . Obesity   . GERD (gastroesophageal reflux disease)   . Heart murmur 10/05/11    "first time I ever heard I had one was today"  . Pneumonia   . Shortness of breath 10/05/11    "at rest; lying down; w/exertion"  . Diabetes mellitus   . Bronchitis     h/o  . Diarrhea     h/o  . Constipated     h/o    Past Surgical History  Procedure Date  . Dobutamine stress echo 08/2004    Inferior ischemia, normal LV systolic function, no significant CAD  . Abdominal hysterectomy   . Breast surgery 1991    lumphectomy right breast  . Neck surgery 2012    "Dr. [name]  put plate in; did something to my vertebrae"    Family History  Problem Relation Age of Onset  . Cancer Mother     History  Substance Use Topics  . Smoking status: Current Some Day Smoker -- 0.5 packs/day for 40 years    Types: Cigarettes  . Smokeless tobacco: Never Used  . Alcohol Use: 2.4 oz/week    4 Cans of beer per week     "only on the weekends"    OB History    Grav Para Term Preterm Abortions TAB SAB Ect Mult Living  Review of Systems  Constitutional: Negative.   HENT: Positive for rhinorrhea.   Eyes: Negative.   Respiratory: Positive for cough, shortness of breath and wheezing.   Cardiovascular: Positive for chest pain.  Gastrointestinal: Negative.   Genitourinary: Negative.   Musculoskeletal: Negative.   Skin: Positive for rash.  Neurological: Negative.     Allergies  Review of patient's allergies indicates no known allergies.  Home Medications   Current Outpatient Rx  Name Route Sig Dispense Refill  . ALBUTEROL SULFATE (2.5 MG/3ML) 0.083% IN NEBU  Inhale the contents of 1 vial via nebulizer by mouth every 4-6 hours as needed for shortness of breath. 75 mL 10  . CARVEDILOL 6.25 MG PO TABS  TAKE 1 TABLET BY MOUTH TWICE  DAILY 60 tablet PRN  . CITALOPRAM HYDROBROMIDE 40 MG PO TABS Oral Take 1 tablet (40 mg total) by mouth daily. 30 tablet 3  . FLUTICASONE-SALMETEROL 250-50 MCG/DOSE IN AEPB Inhalation Inhale 1 puff into the lungs every 12 (twelve) hours. 1 each 5  . GLIPIZIDE XL 5 MG PO TB24  TAKE 1 TABLET BY MOUTH DAILY 30 tablet PRN  . LISINOPRIL-HYDROCHLOROTHIAZIDE 20-25 MG PO TABS  TAKE 1 TABLET BY MOUTH DAILY 30 tablet PRN  . OMEPRAZOLE MAGNESIUM 20 MG PO TBEC Oral Take 1 tablet (20 mg total) by mouth daily. 30 tablet 2  . PRAVASTATIN SODIUM 40 MG PO TABS Oral Take 1 tablet (40 mg total) by mouth daily. 30 tablet 5  . TIOTROPIUM BROMIDE MONOHYDRATE 18 MCG IN CAPS Inhalation Place 1 capsule (18 mcg total) into inhaler and inhale daily. 30 capsule 3  . ACCU-CHEK FASTCLIX LANCETS MISC Does not apply 1 each by Does not apply route 2 (two) times daily. Use to check blood sugar twice daily Dx code: 250. 102 each 3  . ACCU-CHEK AVIVA PLUS W/DEVICE KIT Does not apply 1 each by Does not apply route 2 (two) times daily. 1 kit 0  . CITALOPRAM HYDROBROMIDE 40 MG PO TABS  TAKE 1/2 TABLET BY MOUTH EVERY DAY 15 tablet PRN  . FLUTICASONE PROPIONATE 50 MCG/ACT NA SUSP Nasal Place 2 sprays into the nose daily. 16 g 3  . GLUCOSE BLOOD VI STRP  Use to check blood sugar twice daily. Dx code: 250 100 each 12  . GLUCOSE BLOOD VI STRP  Use as instructed 100 each 11  . GUAIFENESIN ER 600 MG PO TB12 Oral Take 1,200 mg by mouth 2 (two) times daily.      . GUAIFENESIN-CODEINE 100-10 MG/5ML PO SYRP Oral Take 5 mLs by mouth every 6 (six) hours as needed for cough or congestion. 120 mL 0  . HYDROCORTISONE 0.5 % EX CREA Topical Apply topically 2 (two) times daily.    Marland Kitchen LORATADINE 10 MG PO TABS Oral Take 1 tablet (10 mg total) by mouth daily. 30 tablet 3  . PREDNISONE 10 MG PO TABS  Take three tablets (30mg ) once daily by mouth for three days (10/09/11 -> 10/11/11) Then take two tablets (20mg ) once daily by mouth for three days (10/12/11 ->  10/14/11) Then take one tablet (10mg ) once daily by mouth for three days (10/15/11 -> 10/17/11) Then stop 18 tablet 0  . PREDNISONE 20 MG PO TABS Oral Take 2 tablets (40 mg total) by mouth daily. 10 tablet 0  . PRODIGY LANCETS 26G MISC  Use as directed. 100 each 11  . TRAMADOL HCL 50 MG PO TABS Oral Take 50 mg by mouth every 6 (six) hours as needed.  BP 157/117  Pulse 101  Temp(Src) 98.9 F (37.2 C) (Oral)  Resp 22  SpO2 98%  Physical Exam  Nursing note and vitals reviewed. Constitutional: She is oriented to person, place, and time. She appears well-developed and well-nourished.  HENT:  Head: Normocephalic and atraumatic.  Eyes: EOM are normal.  Neck: Normal range of motion.  Cardiovascular: Normal rate, regular rhythm, S1 normal, S2 normal and normal heart sounds.   Pulmonary/Chest: Effort normal. She has no decreased breath sounds. She has wheezes in the right middle field and the left middle field. She has no rhonchi.  Musculoskeletal: Normal range of motion.  Neurological: She is alert and oriented to person, place, and time.  Skin: Skin is warm and dry. Lesion noted.     Psychiatric: Her behavior is normal.    ED Course  Procedures (including critical care time)  Labs Reviewed - No data to display No results found.   1. Asthma attack       MDM  Wheezing and dyspnea improved after administration of Duoneb in clinic; given rx for guaifenesin AC, and prednisone burst; administered albuterol inhaler in clinic; referred to [facility] for evaluation of skin lesions        Renaee Munda, MD 02/12/12 1430  Renaee Munda, MD 02/12/12 1431

## 2012-02-12 NOTE — Discharge Instructions (Signed)
Use albuterol every 4 hours for the next 24 hours. Then may space it out to every 6 hours for the second 24 hours. After that, use only as needed. Take steroids as directed. Return to care should your symptoms not improve, or worsen in any way.

## 2012-02-14 ENCOUNTER — Other Ambulatory Visit: Payer: Self-pay | Admitting: Internal Medicine

## 2012-02-15 ENCOUNTER — Encounter (HOSPITAL_COMMUNITY): Payer: Self-pay | Admitting: *Deleted

## 2012-02-15 ENCOUNTER — Emergency Department (HOSPITAL_COMMUNITY): Payer: Medicare Other

## 2012-02-15 ENCOUNTER — Other Ambulatory Visit: Payer: Self-pay

## 2012-02-15 ENCOUNTER — Emergency Department (HOSPITAL_COMMUNITY)
Admission: EM | Admit: 2012-02-15 | Discharge: 2012-02-15 | Disposition: A | Discharge status: Home / Self Care | Payer: Medicare Other | Attending: Emergency Medicine | Admitting: Emergency Medicine

## 2012-02-15 DIAGNOSIS — J45909 Unspecified asthma, uncomplicated: Secondary | ICD-10-CM | POA: Diagnosis not present

## 2012-02-15 DIAGNOSIS — R05 Cough: Secondary | ICD-10-CM | POA: Diagnosis not present

## 2012-02-15 DIAGNOSIS — Z853 Personal history of malignant neoplasm of breast: Secondary | ICD-10-CM | POA: Diagnosis not present

## 2012-02-15 DIAGNOSIS — J4489 Other specified chronic obstructive pulmonary disease: Secondary | ICD-10-CM | POA: Insufficient documentation

## 2012-02-15 DIAGNOSIS — F341 Dysthymic disorder: Secondary | ICD-10-CM | POA: Insufficient documentation

## 2012-02-15 DIAGNOSIS — E785 Hyperlipidemia, unspecified: Secondary | ICD-10-CM | POA: Diagnosis not present

## 2012-02-15 DIAGNOSIS — K219 Gastro-esophageal reflux disease without esophagitis: Secondary | ICD-10-CM | POA: Insufficient documentation

## 2012-02-15 DIAGNOSIS — Z79899 Other long term (current) drug therapy: Secondary | ICD-10-CM | POA: Insufficient documentation

## 2012-02-15 DIAGNOSIS — J449 Chronic obstructive pulmonary disease, unspecified: Secondary | ICD-10-CM | POA: Insufficient documentation

## 2012-02-15 DIAGNOSIS — R059 Cough, unspecified: Secondary | ICD-10-CM | POA: Diagnosis not present

## 2012-02-15 DIAGNOSIS — R0602 Shortness of breath: Secondary | ICD-10-CM | POA: Insufficient documentation

## 2012-02-15 DIAGNOSIS — E119 Type 2 diabetes mellitus without complications: Secondary | ICD-10-CM | POA: Insufficient documentation

## 2012-02-15 DIAGNOSIS — I10 Essential (primary) hypertension: Secondary | ICD-10-CM | POA: Diagnosis not present

## 2012-02-15 DIAGNOSIS — R0989 Other specified symptoms and signs involving the circulatory and respiratory systems: Secondary | ICD-10-CM | POA: Diagnosis not present

## 2012-02-15 LAB — URINALYSIS, ROUTINE W REFLEX MICROSCOPIC
Glucose, UA: NEGATIVE mg/dL
Hgb urine dipstick: NEGATIVE
Leukocytes, UA: NEGATIVE
Specific Gravity, Urine: 1.013 (ref 1.005–1.030)
pH: 6 (ref 5.0–8.0)

## 2012-02-15 LAB — POCT I-STAT TROPONIN I: Troponin i, poc: 0.02 ng/mL (ref 0.00–0.08)

## 2012-02-15 LAB — GLUCOSE, CAPILLARY: Glucose-Capillary: 201 mg/dL — ABNORMAL HIGH (ref 70–99)

## 2012-02-15 MED ORDER — IPRATROPIUM BROMIDE 0.02 % IN SOLN
0.5000 mg | Freq: Once | RESPIRATORY_TRACT | Status: AC
  Administered 2012-02-15: 0.5 mg via RESPIRATORY_TRACT
  Filled 2012-02-15: qty 2.5

## 2012-02-15 MED ORDER — ALBUTEROL SULFATE (5 MG/ML) 0.5% IN NEBU
10.0000 mg | INHALATION_SOLUTION | Freq: Once | RESPIRATORY_TRACT | Status: AC
  Administered 2012-02-15: 10 mg via RESPIRATORY_TRACT

## 2012-02-15 MED ORDER — PREDNISONE 20 MG PO TABS: 20.0000 mg | ORAL_TABLET | Freq: Every day | ORAL | Status: DC

## 2012-02-15 MED ORDER — HYDROCODONE-HOMATROPINE 5-1.5 MG/5ML PO SYRP: 5.0000 mL | ORAL_SOLUTION | Freq: Four times a day (QID) | ORAL | Status: DC | PRN

## 2012-02-15 MED ORDER — ALBUTEROL SULFATE (5 MG/ML) 0.5% IN NEBU
10.0000 mg | INHALATION_SOLUTION | Freq: Once | RESPIRATORY_TRACT | Status: AC
  Administered 2012-02-15: 10 mg via RESPIRATORY_TRACT
  Filled 2012-02-15: qty 2

## 2012-02-15 MED ORDER — HYDROCOD POLST-CHLORPHEN POLST 10-8 MG/5ML PO LQCR
5.0000 mL | Freq: Once | ORAL | Status: AC
  Administered 2012-02-15: 5 mL via ORAL
  Filled 2012-02-15: qty 5

## 2012-02-15 MED ORDER — ALBUTEROL SULFATE (5 MG/ML) 0.5% IN NEBU
INHALATION_SOLUTION | RESPIRATORY_TRACT | Status: AC
  Filled 2012-02-15: qty 2

## 2012-02-15 NOTE — ED Notes (Signed)
RT aware of need for another [facility]

## 2012-02-15 NOTE — ED Notes (Signed)
Respirations even & unlabored, no distress.  Pt requesting pain medicine

## 2012-02-15 NOTE — ED Notes (Signed)
Patient transported to X-ray 

## 2012-02-15 NOTE — ED Notes (Signed)
Pt also is going to attempt to provide an urine specimen while she is in the bathroom

## 2012-02-15 NOTE — ED Provider Notes (Addendum)
History     CSN: [id number]  Arrival date & time 02/15/12  5643   First MD Initiated Contact with Patient 02/15/12 [phone]      Chief Complaint  Patient presents with  . Shortness of Breath    (Consider location/radiation/quality/duration/timing/severity/associated sxs/prior treatment) HPI Comments: Patient with a history of asthma, diabetes, a obesity, breast cancer, and COPD presents to the emergency department with chief complaint of productive cough and shortness of breath.  Patient was evaluated for the same symptoms at urgent care on Saturday.  At that time patient was treated with a nebulizer and given prednisone.  Patient's symptoms have gradually worsened and she feels that therapy is not helping.  Patient denies any chest pain but states that she is having chest pressure and soreness from repetitive coughing.  Patient denies PND, orthopnea, hemoptysis, fevers, nigh sweats, chills, difficulty swallowing, drooling, N/V or D.   Patient is a 61 y.o. female presenting with shortness of breath. The history is provided by the patient.  Shortness of Breath  Associated symptoms include shortness of breath.    Past Medical History  Diagnosis Date  . Hyperlipidemia   . Hypertension   . Tobacco abuse   . COPD (chronic obstructive pulmonary disease)     History of multiple hospital admissions for exercabation   . Asthma   . Breast cancer 1991    s/p lumpectomy, chemotherapy and radiation therapy in 1991. Mammogram in 2007 was normal.  . Sigmoid diverticulitis 80/2008  . Anxiety   . Depression   . Obesity   . GERD (gastroesophageal reflux disease)   . Heart murmur 10/05/11    "first time I ever heard I had one was today"  . Pneumonia   . Shortness of breath 10/05/11    "at rest; lying down; w/exertion"  . Diabetes mellitus   . Bronchitis     h/o  . Diarrhea     h/o  . Constipated     h/o    Past Surgical History  Procedure Date  . Dobutamine stress echo 08/2004   Inferior ischemia, normal LV systolic function, no significant CAD  . Abdominal hysterectomy   . Breast surgery 1991    lumphectomy right breast  . Neck surgery 2012    "Dr. [name]  put plate in; did something to my vertebrae"    Family History  Problem Relation Age of Onset  . Cancer Mother     History  Substance Use Topics  . Smoking status: Current Some Day Smoker -- 0.5 packs/day for 40 years    Types: Cigarettes  . Smokeless tobacco: Never Used  . Alcohol Use: 2.4 oz/week    4 Cans of beer per week     "only on the weekends"    OB History    Grav Para Term Preterm Abortions TAB SAB Ect Mult Living                  Review of Systems  Respiratory: Positive for shortness of breath.     Allergies  Review of patient's allergies indicates no known allergies.  Home Medications   Current Outpatient Rx  Name Route Sig Dispense Refill  . ALBUTEROL SULFATE (2.5 MG/3ML) 0.083% IN NEBU Nebulization Take 2.5 mg by nebulization every 4 (four) hours as needed. For shortness of breath    . CARVEDILOL 6.25 MG PO TABS Oral Take 6.25 mg by mouth 2 (two) times daily with a meal.    . CITALOPRAM HYDROBROMIDE 40 MG  PO TABS Oral Take 1 tablet (40 mg total) by mouth daily. 30 tablet 3  . FLUTICASONE PROPIONATE 50 MCG/ACT NA SUSP Nasal Place 2 sprays into the nose daily. 16 g 3  . FLUTICASONE-SALMETEROL 250-50 MCG/DOSE IN AEPB Inhalation Inhale 1 puff into the lungs every 12 (twelve) hours. 1 each 5  . GLIPIZIDE ER 5 MG PO TB24 Oral Take 5 mg by mouth daily.    . GUAIFENESIN ER 600 MG PO TB12 Oral Take 1,200 mg by mouth 2 (two) times daily.     . GUAIFENESIN-CODEINE 100-10 MG/5ML PO SYRP Oral Take 5 mLs by mouth every 6 (six) hours as needed for cough or congestion. 120 mL 0  . HYDROCORTISONE 0.5 % EX CREA Topical Apply topically 2 (two) times daily.    Marland Kitchen LISINOPRIL-HYDROCHLOROTHIAZIDE 20-25 MG PO TABS Oral Take 1 tablet by mouth daily.    Marland Kitchen LORATADINE 10 MG PO TABS Oral Take 1 tablet  (10 mg total) by mouth daily. 30 tablet 3  . OMEPRAZOLE MAGNESIUM 20 MG PO TBEC Oral Take 1 tablet (20 mg total) by mouth daily. 30 tablet 2  . PRAVASTATIN SODIUM 40 MG PO TABS Oral Take 1 tablet (40 mg total) by mouth daily. 30 tablet 5  . PREDNISONE 20 MG PO TABS Oral Take 2 tablets (40 mg total) by mouth daily. 10 tablet 0  . TIOTROPIUM BROMIDE MONOHYDRATE 18 MCG IN CAPS Inhalation Place 1 capsule (18 mcg total) into inhaler and inhale daily. 30 capsule 3  . TRAMADOL HCL 50 MG PO TABS Oral Take 50 mg by mouth every 6 (six) hours as needed. For pain      BP 124/65  Pulse 86  Temp(Src) 98.3 F (36.8 C) (Oral)  Resp 24  SpO2 95%  Physical Exam  Constitutional: She is oriented to person, place, and time. She appears well-developed and well-nourished. No distress.  HENT:  Head: Normocephalic and atraumatic.       Uvula midline, oropharynx clear and moist  Eyes: Conjunctivae and EOM are normal. Pupils are equal, round, and reactive to light.  Neck: Normal range of motion.       No stridor.  Neck supple with full range of motion.  Cardiovascular: Normal rate, regular rhythm, normal heart sounds and intact distal pulses.   Pulmonary/Chest: Effort normal. She has wheezes (expiratory). She exhibits tenderness.       Patient not in respiratory distress, no accessory muscle use, nasal flaring.  Patient able to speak in full sentences.  Airway intact.  Lung auscultation positive for bilateral expiratory wheezing  Abdominal: Soft. There is no tenderness.  Musculoskeletal: Normal range of motion. She exhibits no edema and no tenderness.  Neurological: She is alert and oriented to person, place, and time.  Skin: Skin is warm and dry. No rash noted. She is not diaphoretic.  Psychiatric: She has a normal mood and affect. Her behavior is normal.    ED Course  Procedures (including critical care time)  Labs Reviewed  PRO B NATRIURETIC PEPTIDE - Abnormal; Notable for the following:    Pro B  Natriuretic peptide (BNP) 129.0 (*)    All other components within normal limits  POCT I-STAT TROPONIN I  URINALYSIS, ROUTINE W REFLEX MICROSCOPIC   Dg Chest 2 View  02/15/2012  *RADIOLOGY REPORT*  Clinical Data: Cough, congestion  CHEST - 2 VIEW  Comparison: 10/05/2011  Findings: Cardiomediastinal silhouette is stable.  Mild hyperinflation again noted.  No acute infiltrate or pulmonary edema.  Surgical clips  right axilla again noted.  Metallic fixation plate cervical spine. Stable mild degenerative changes thoracic spine.  IMPRESSION: No active disease.  No significant change.  Original Report Authenticated By: Natasha Mead, M.D.     No diagnosis found.   Date: 02/15/2012  Rate: 78  Rhythm: normal sinus rhythm  QRS Axis: normal  Intervals: normal  ST/T Wave abnormalities: normal  Conduction Disutrbances: none  Narrative Interpretation:   Old EKG Reviewed: No significant changes noted     MDM  SOB, viral URI & asthma exacerbation   Patient ambulated in ED with O2 saturations maintained >95, no current signs of respiratory distress. Lung exam improved after hospital treatment. Pt states they are breathing at baseline. Pt has been instructed to continue using prescribed medications & will be given a steroid taper as well. She is to speak with PCP about today's exacerbation. In addition pt will be given hycodan for symptomatic tx of cough. Pt is agreeable with plan to dc         Jaci Carrel, PA-C 02/15/12 [address], PA-C 04/22/12 0040

## 2012-02-15 NOTE — ED Notes (Signed)
Pt returned from being out of the department; pt now is ambulatory to the bathroom without any assistance or distress

## 2012-02-15 NOTE — ED Notes (Signed)
Pt unable to provide an urine specimen at this time; will attempt later

## 2012-02-15 NOTE — ED Notes (Signed)
Patient reports she has had sob and cough since Saturday.  Patient was seen at urgent care on Saturday.  She was given breathing tx and prednisone.  Patient states she is not feeling any better.  She continues to have green colored sputum and states she has pressure in her chest.

## 2012-02-15 NOTE — ED Notes (Signed)
Pt d/c'd from monitor, continuous pulse oximetry and blood pressure cuff; pt getting dressed to be discharged home 

## 2012-02-15 NOTE — ED Notes (Signed)
Respiratory at bedside.

## 2012-02-15 NOTE — Discharge Instructions (Signed)
Asthma Attack Prevention HOW CAN ASTHMA BE PREVENTED? Currently, there is no way to prevent asthma from starting. However, you can take steps to control the disease and prevent its symptoms after you have been diagnosed. Learn about your asthma and how to control it. Take an active role to control your asthma by working with your caregiver to create and follow an asthma action plan. An asthma action plan guides you in taking your medicines properly, avoiding factors that make your asthma worse, tracking your level of asthma control, responding to worsening asthma, and seeking emergency care when needed. To track your asthma, keep records of your symptoms, check your peak flow number using a peak flow meter (handheld device that shows how well air moves out of your lungs), and get regular asthma checkups.  Other ways to prevent asthma attacks include:  Use medicines as your caregiver directs.   Identify and avoid things that make your asthma worse (as much as you can).   Keep track of your asthma symptoms and level of control.   Get regular checkups for your asthma.   With your caregiver, write a detailed plan for taking medicines and managing an asthma attack. Then be sure to follow your action plan. Asthma is an ongoing condition that needs regular monitoring and treatment.   Identify and avoid asthma triggers. A number of outdoor allergens and irritants (pollen, mold, cold air, air pollution) can trigger asthma attacks. Find out what causes or makes your asthma worse, and take steps to avoid those triggers (see below).   Monitor your breathing. Learn to recognize warning signs of an attack, such as slight coughing, wheezing or shortness of breath. However, your lung function may already decrease before you notice any signs or symptoms, so regularly measure and record your peak airflow with a home peak flow meter.   Identify and treat attacks early. If you act quickly, you're less likely to have  a severe attack. You will also need less medicine to control your symptoms. When your peak flow measurements decrease and alert you to an upcoming attack, take your medicine as instructed, and immediately stop any activity that may have triggered the attack. If your symptoms do not improve, get medical help.   Pay attention to increasing quick-relief inhaler use. If you find yourself relying on your quick-relief inhaler (such as albuterol), your asthma is not under control. See your caregiver about adjusting your treatment.  IDENTIFY AND CONTROL FACTORS THAT MAKE YOUR ASTHMA WORSE A number of common things can set off or make your asthma symptoms worse (asthma triggers). Keep track of your asthma symptoms for several weeks, detailing all the environmental and emotional factors that are linked with your asthma. When you have an asthma attack, go back to your asthma diary to see which factor, or combination of factors, might have contributed to it. Once you know what these factors are, you can take steps to control many of them.  Allergies: If you have allergies and asthma, it is important to take asthma prevention steps at home. Asthma attacks (worsening of asthma symptoms) can be triggered by allergies, which can cause temporary increased inflammation of your airways. Minimizing contact with the substance to which you are allergic will help prevent an asthma attack. Animal Dander:   Some people are allergic to the flakes of skin or dried saliva from animals with fur or feathers. Keep these pets out of your home.   If you can't keep a pet outdoors, keep the   pet out of your bedroom and other sleeping areas at all times, and keep the door closed.   Remove carpets and furniture covered with cloth from your home. If that is not possible, keep the pet away from fabric-covered furniture and carpets.  Dust Mites:  Many people with asthma are allergic to dust mites. Dust mites are tiny bugs that are found in  every home, in mattresses, pillows, carpets, fabric-covered furniture, bedcovers, clothes, stuffed toys, fabric, and other fabric-covered items.   Cover your mattress in a special dust-proof cover.   Cover your pillow in a special dust-proof cover, or wash the pillow each week in hot water. Water must be hotter than 130 F to kill dust mites. Cold or warm water used with detergent and bleach can also be effective.   Wash the sheets and blankets on your bed each week in hot water.   Try not to sleep or lie on cloth-covered cushions.   Call ahead when traveling and ask for a smoke-free hotel room. Bring your own bedding and pillows, in case the hotel only supplies feather pillows and down comforters, which may contain dust mites and cause asthma symptoms.   Remove carpets from your bedroom and those laid on concrete, if you can.   Keep stuffed toys out of the bed, or wash the toys weekly in hot water or cooler water with detergent and bleach.  Cockroaches:  Many people with asthma are allergic to the droppings and remains of cockroaches.   Keep food and garbage in closed containers. Never leave food out.   Use poison baits, traps, powders, gels, or paste (for example, boric acid).   If a spray is used to kill cockroaches, stay out of the room until the odor goes away.  Indoor Mold:  Fix leaky faucets, pipes, or other sources of water that have mold around them.   Clean moldy surfaces with a cleaner that has bleach in it.  Pollen and Outdoor Mold:  When pollen or mold spore counts are high, try to keep your windows closed.   Stay indoors with windows closed from late morning to afternoon, if you can. Pollen and some mold spore counts are highest at that time.   Ask your caregiver whether you need to take or increase anti-inflammatory medicine before your allergy season starts.  Irritants:   Tobacco smoke is an irritant. If you smoke, ask your caregiver how you can quit. Ask family  members to quit smoking, too. Do not allow smoking in your home or car.   If possible, do not use a wood-burning stove, kerosene heater, or fireplace. Minimize exposure to all sources of smoke, including incense, candles, fires, and fireworks.   Try to stay away from strong odors and sprays, such as perfume, talcum powder, hair spray, and paints.   Decrease humidity in your home and use an indoor air cleaning device. Reduce indoor humidity to below 60 percent. Dehumidifiers or central air conditioners can do this.   Try to have someone else vacuum for you once or twice a week, if you can. Stay out of rooms while they are being vacuumed and for a short while afterward.   If you vacuum, use a dust mask from a hardware store, a double-layered or microfilter vacuum cleaner bag, or a vacuum cleaner with a HEPA filter.   Sulfites in foods and beverages can be irritants. Do not drink beer or wine, or eat dried fruit, processed potatoes, or shrimp if they cause asthma   symptoms.   Cold air can trigger an asthma attack. Cover your nose and mouth with a scarf on cold or windy days.   Several health conditions can make asthma more difficult to manage, including runny nose, sinus infections, reflux disease, psychological stress, and sleep apnea. Your caregiver will treat these conditions, as well.   Avoid close contact with people who have a cold or the flu, since your asthma symptoms may get worse if you catch the infection from them. Wash your hands thoroughly after touching items that may have been handled by people with a respiratory infection.   Get a flu shot every year to protect against the flu virus, which often makes asthma worse for days or weeks. Also get a pneumonia shot once every five to 10 years.  Drugs:  Aspirin and other painkillers can cause asthma attacks. 10% to 20% of people with asthma have sensitivity to aspirin or a group of painkillers called non-steroidal anti-inflammatory drugs  (NSAIDS), such as ibuprofen and naproxen. These drugs are used to treat pain and reduce fevers. Asthma attacks caused by any of these medicines can be severe and even fatal. These drugs must be avoided in people who have known aspirin sensitive asthma. Products with acetaminophen are considered safe for people who have asthma. It is important that people with aspirin sensitivity read labels of all over-the-counter drugs used to treat pain, colds, coughs, and fever.   Beta blockers and ACE inhibitors are other drugs which you should discuss with your caregiver, in relation to your asthma.  ALLERGY SKIN TESTING  Ask your asthma caregiver about allergy skin testing or blood testing (RAST test) to identify the allergens to which you are sensitive. If you are found to have allergies, allergy shots (immunotherapy) for asthma may help prevent future allergies and asthma. With allergy shots, small doses of allergens (substances to which you are allergic) are injected under your skin on a regular schedule. Over a period of time, your body may become used to the allergen and less responsive with asthma symptoms. You can also take measures to minimize your exposure to those allergens. EXERCISE  If you have exercise-induced asthma, or are planning vigorous exercise, or exercise in cold, humid, or dry environments, prevent exercise-induced asthma by following your caregiver's advice regarding asthma treatment before exercising. Document Released: 10/13/2009 Document Revised: 10/14/2011 Document Reviewed: 10/13/2009 [employer] Patient Information 2012 [employer]. 

## 2012-02-15 NOTE — ED Provider Notes (Signed)
Medical screening examination/treatment/procedure(s) were conducted as a shared visit with non-physician practitioner(s) and myself.  I personally evaluated the patient during the encounter  Patient given albuterol treatments for wheezing. Suspect that she has some element of early COPD. Will place on meds for this and likely discharge  Christie Baker, MD 02/15/12 1310

## 2012-02-15 NOTE — ED Notes (Signed)
C/o prod cough x 2 weeks, chills, bilateral rib pain due to coughing, h/a, SOB. Presently resp e/u, no distress

## 2012-02-16 NOTE — ED Provider Notes (Signed)
Medical screening examination/treatment/procedure(s) were conducted as a shared visit with non-physician practitioner(s) and myself.  I personally evaluated the patient during the encounter  Toy Baker, MD 02/16/12 [phone]

## 2012-02-18 ENCOUNTER — Emergency Department (HOSPITAL_COMMUNITY): Payer: Medicare Other

## 2012-02-18 ENCOUNTER — Encounter: Payer: Self-pay | Admitting: Internal Medicine

## 2012-02-18 ENCOUNTER — Emergency Department (HOSPITAL_COMMUNITY)
Admission: EM | Admit: 2012-02-18 | Discharge: 2012-02-18 | Disposition: A | Discharge status: Home / Self Care | Payer: Medicare Other | Attending: Emergency Medicine | Admitting: Emergency Medicine

## 2012-02-18 ENCOUNTER — Encounter (HOSPITAL_COMMUNITY): Payer: Self-pay | Admitting: *Deleted

## 2012-02-18 ENCOUNTER — Other Ambulatory Visit: Payer: Self-pay | Admitting: Internal Medicine

## 2012-02-18 DIAGNOSIS — J4489 Other specified chronic obstructive pulmonary disease: Secondary | ICD-10-CM | POA: Insufficient documentation

## 2012-02-18 DIAGNOSIS — Z79899 Other long term (current) drug therapy: Secondary | ICD-10-CM | POA: Insufficient documentation

## 2012-02-18 DIAGNOSIS — J449 Chronic obstructive pulmonary disease, unspecified: Secondary | ICD-10-CM | POA: Insufficient documentation

## 2012-02-18 DIAGNOSIS — I10 Essential (primary) hypertension: Secondary | ICD-10-CM | POA: Insufficient documentation

## 2012-02-18 DIAGNOSIS — J40 Bronchitis, not specified as acute or chronic: Secondary | ICD-10-CM

## 2012-02-18 DIAGNOSIS — E785 Hyperlipidemia, unspecified: Secondary | ICD-10-CM | POA: Insufficient documentation

## 2012-02-18 DIAGNOSIS — R05 Cough: Secondary | ICD-10-CM | POA: Diagnosis not present

## 2012-02-18 DIAGNOSIS — R0602 Shortness of breath: Secondary | ICD-10-CM | POA: Diagnosis not present

## 2012-02-18 DIAGNOSIS — F17200 Nicotine dependence, unspecified, uncomplicated: Secondary | ICD-10-CM | POA: Insufficient documentation

## 2012-02-18 DIAGNOSIS — J209 Acute bronchitis, unspecified: Secondary | ICD-10-CM | POA: Diagnosis not present

## 2012-02-18 DIAGNOSIS — R0989 Other specified symptoms and signs involving the circulatory and respiratory systems: Secondary | ICD-10-CM | POA: Diagnosis not present

## 2012-02-18 LAB — BASIC METABOLIC PANEL
Chloride: 97 mEq/L (ref 96–112)
Creatinine, Ser: 0.67 mg/dL (ref 0.50–1.10)
GFR calc Af Amer: >90 mL/min (ref >90)
Potassium: 3 mEq/L — ABNORMAL LOW (ref 3.5–5.1)

## 2012-02-18 LAB — CBC
Platelets: 295 10*3/uL (ref 150–400)
RDW: 13 % (ref 11.5–15.5)
WBC: 9.1 10*3/uL (ref 4.0–10.5)

## 2012-02-18 MED ORDER — ALBUTEROL SULFATE (5 MG/ML) 0.5% IN NEBU
15.0000 mg | INHALATION_SOLUTION | Freq: Once | RESPIRATORY_TRACT | Status: AC
  Administered 2012-02-18: 15 mg via RESPIRATORY_TRACT

## 2012-02-18 MED ORDER — SULFAMETHOXAZOLE-TRIMETHOPRIM 800-160 MG PO TABS: 1.0000 | ORAL_TABLET | Freq: Two times a day (BID) | ORAL | Status: DC

## 2012-02-18 MED ORDER — ONDANSETRON HCL 4 MG/2ML IJ SOLN
4.0000 mg | Freq: Once | INTRAMUSCULAR | Status: AC
  Administered 2012-02-18: 4 mg via INTRAVENOUS
  Filled 2012-02-18: qty 2

## 2012-02-18 MED ORDER — PREDNISONE 20 MG PO TABS: 20.0000 mg | ORAL_TABLET | Freq: Every day | ORAL | Status: DC

## 2012-02-18 MED ORDER — HYDROCOD POLST-CHLORPHEN POLST 10-8 MG/5ML PO LQCR
5.0000 mL | Freq: Once | ORAL | Status: AC
  Administered 2012-02-18: 5 mL via ORAL
  Filled 2012-02-18: qty 5

## 2012-02-18 MED ORDER — HYDROCODONE-HOMATROPINE 5-1.5 MG/5ML PO SYRP: 5.0000 mL | ORAL_SOLUTION | Freq: Four times a day (QID) | ORAL | Status: DC | PRN

## 2012-02-18 MED ORDER — MORPHINE SULFATE 4 MG/ML IJ SOLN
4.0000 mg | Freq: Once | INTRAMUSCULAR | Status: AC
  Administered 2012-02-18: 4 mg via INTRAVENOUS
  Filled 2012-02-18: qty 1

## 2012-02-18 MED ORDER — ALBUTEROL (5 MG/ML) CONTINUOUS INHALATION SOLN
INHALATION_SOLUTION | RESPIRATORY_TRACT | Status: AC
  Filled 2012-02-18: qty 20

## 2012-02-18 MED ORDER — ALBUTEROL SULFATE (5 MG/ML) 0.5% IN NEBU
5.0000 mg | INHALATION_SOLUTION | Freq: Once | RESPIRATORY_TRACT | Status: AC
  Administered 2012-02-18: 5 mg via RESPIRATORY_TRACT
  Filled 2012-02-18: qty 1

## 2012-02-18 MED ORDER — OXYCODONE-ACETAMINOPHEN 5-325 MG PO TABS: 2.0000 | ORAL_TABLET | ORAL | Status: DC | PRN

## 2012-02-18 MED ORDER — OXYCODONE-ACETAMINOPHEN 5-325 MG PO TABS
1.0000 | ORAL_TABLET | Freq: Once | ORAL | Status: AC
  Administered 2012-02-18: 1 via ORAL
  Filled 2012-02-18: qty 1

## 2012-02-18 MED ORDER — IPRATROPIUM BROMIDE 0.02 % IN SOLN
0.5000 mg | Freq: Once | RESPIRATORY_TRACT | Status: AC
  Administered 2012-02-18: 0.5 mg via RESPIRATORY_TRACT
  Filled 2012-02-18: qty 2.5

## 2012-02-18 MED ORDER — SODIUM CHLORIDE 0.9 % IV SOLN
INTRAVENOUS | Status: DC
  Administered 2012-02-18: 08:00:00 via INTRAVENOUS

## 2012-02-18 MED ORDER — SULFAMETHOXAZOLE-TMP DS 800-160 MG PO TABS
1.0000 | ORAL_TABLET | Freq: Once | ORAL | Status: AC
  Administered 2012-02-18: 1 via ORAL
  Filled 2012-02-18: qty 1

## 2012-02-18 NOTE — Progress Notes (Signed)
Hycodan rx called to [facility] [address] pharmacy.

## 2012-02-18 NOTE — ED Notes (Addendum)
States that she is SOB and having pain in her chest when she breaths.  Pt reports having a cough x 3 weeks.  States that she was seen here 2 days ago-unsure of what was the diagnosis was.  Pt has been coughing up yellowish-green sputum.  Pt reports taking a breathing treat at home PTA without relief.  Pt reports head and rib pain.  Pt tearful.

## 2012-02-18 NOTE — ED Notes (Signed)
Pt c/o sob, productive cough w/thick yellow/green colored sputum, non-cardiac chest pain d/t chest congestion, (R) side rib pain d/t coughing, and frontal headache x3 wks. Pt reports she was seen here 2 days ago for the same w/o any relief, unsure of her diagnosis. Pt reports using a breathing treatment at home this am w/o any relief.

## 2012-02-18 NOTE — ED Provider Notes (Signed)
History     CSN: [id number]  Arrival date & time 02/18/12  4098   First MD Initiated Contact with Patient 02/18/12 [phone]      Chief Complaint  Patient presents with  . Shortness of Breath    (Consider location/radiation/quality/duration/timing/severity/associated sxs/prior treatment) The history is provided by the patient.   the patient is a 61 year old, female, with a history of COPD, who continues to smoke.  She presents to emergency department with a cough, and shortness of breath.  For the past 3 weeks.  She says that she has yellow, thick sputum.  She denies fever, or chills.  She states that she has chest soreness, and abdominal soreness from the constant coughing.  She has had nausea, but no vomiting.  She denies blood in her sputum.  She denies a history of CHF, leg pain or swelling.  She does not use oxygen at home.  Past Medical History  Diagnosis Date  . Hyperlipidemia   . Hypertension   . Tobacco abuse   . COPD (chronic obstructive pulmonary disease)     History of multiple hospital admissions for exercabation   . Asthma   . Breast cancer 1991    s/p lumpectomy, chemotherapy and radiation therapy in 1991. Mammogram in 2007 was normal.  . Sigmoid diverticulitis 80/2008  . Anxiety   . Depression   . Obesity   . GERD (gastroesophageal reflux disease)   . Heart murmur 10/05/11    "first time I ever heard I had one was today"  . Pneumonia   . Shortness of breath 10/05/11    "at rest; lying down; w/exertion"  . Diabetes mellitus   . Bronchitis     h/o  . Diarrhea     h/o  . Constipated     h/o    Past Surgical History  Procedure Date  . Dobutamine stress echo 08/2004    Inferior ischemia, normal LV systolic function, no significant CAD  . Abdominal hysterectomy   . Breast surgery 1991    lumphectomy right breast  . Neck surgery 2012    "Dr. [name]  put plate in; did something to my vertebrae"    Family History  Problem Relation Age of Onset  . Cancer  Mother     History  Substance Use Topics  . Smoking status: Current Some Day Smoker -- 0.5 packs/day for 40 years    Types: Cigarettes  . Smokeless tobacco: Never Used  . Alcohol Use: 2.4 oz/week    4 Cans of beer per week     "only on the weekends"    OB History    Grav Para Term Preterm Abortions TAB SAB Ect Mult Living                  Review of Systems  Constitutional: Negative for fever and chills.  HENT: Negative for congestion.   Respiratory: Positive for cough and shortness of breath. Negative for chest tightness.   Cardiovascular: Positive for chest pain. Negative for palpitations and leg swelling.       Chest pain occurs when she coughs  Gastrointestinal: Positive for nausea. Negative for vomiting.  Neurological: Positive for headaches.  Psychiatric/Behavioral: Negative for confusion.  All other systems reviewed and are negative.    Allergies  Review of patient's allergies indicates no known allergies.  Home Medications   Current Outpatient Rx  Name Route Sig Dispense Refill  . ALBUTEROL SULFATE (2.5 MG/3ML) 0.083% IN NEBU Nebulization Take 2.5 mg by nebulization  every 4 (four) hours as needed. For shortness of breath    . CARVEDILOL 6.25 MG PO TABS Oral Take 6.25 mg by mouth 2 (two) times daily with a meal.    . CITALOPRAM HYDROBROMIDE 40 MG PO TABS Oral Take 1 tablet (40 mg total) by mouth daily. 30 tablet 3  . FLUTICASONE PROPIONATE 50 MCG/ACT NA SUSP Nasal Place 2 sprays into the nose daily. 16 g 3  . FLUTICASONE-SALMETEROL 250-50 MCG/DOSE IN AEPB Inhalation Inhale 1 puff into the lungs every 12 (twelve) hours. 1 each 5  . GLIPIZIDE ER 5 MG PO TB24 Oral Take 5 mg by mouth daily.    . GUAIFENESIN ER 600 MG PO TB12 Oral Take 1,200 mg by mouth 2 (two) times daily.     . GUAIFENESIN-CODEINE 100-10 MG/5ML PO SYRP Oral Take 5 mLs by mouth every 6 (six) hours as needed for cough or congestion. 120 mL 0  . HYDROCODONE-HOMATROPINE 5-1.5 MG/5ML PO SYRP Oral Take 5  mLs by mouth every 6 (six) hours as needed for cough. 120 mL 0  . HYDROCORTISONE 0.5 % EX CREA Topical Apply topically 2 (two) times daily.    Marland Kitchen LISINOPRIL-HYDROCHLOROTHIAZIDE 20-25 MG PO TABS Oral Take 1 tablet by mouth daily.    Marland Kitchen LORATADINE 10 MG PO TABS Oral Take 1 tablet (10 mg total) by mouth daily. 30 tablet 3  . OMEPRAZOLE MAGNESIUM 20 MG PO TBEC Oral Take 1 tablet (20 mg total) by mouth daily. 30 tablet 2  . PRAVASTATIN SODIUM 40 MG PO TABS Oral Take 1 tablet (40 mg total) by mouth daily. 30 tablet 5  . PREDNISONE 20 MG PO TABS Oral Take 2 tablets (40 mg total) by mouth daily. 10 tablet 0  . PREDNISONE 20 MG PO TABS Oral Take 1 tablet (20 mg total) by mouth daily. Take 3 tabs day 1, 2 tabs day 2 & 3, then 1 tab day 4-7 11 tablet 0  . SPIRIVA HANDIHALER 18 MCG IN CAPS  INHALE THE CONTENTS OF 1 CAPSULE VIA HANDIHALER BY MOUTH EVERY DAY 30 capsule PRN  . TRAMADOL HCL 50 MG PO TABS Oral Take 50 mg by mouth every 6 (six) hours as needed. For pain      BP 160/81  Temp 98.3 F (36.8 C)  Resp 20  SpO2 96%  Physical Exam  Vitals reviewed. Constitutional: She is oriented to person, place, and time. She appears well-developed and well-nourished. No distress.       Ill-appearing  HENT:  Head: Normocephalic and atraumatic.  Eyes: Conjunctivae are normal.  Neck: Normal range of motion. Neck supple.  Cardiovascular: Normal rate.   No murmur heard. Pulmonary/Chest: Effort normal. No respiratory distress. She has wheezes. She has no rales.       Rhonchi bilaterally  Abdominal: Soft. There is no tenderness.  Musculoskeletal: Normal range of motion. She exhibits no edema and no tenderness.  Neurological: She is alert and oriented to person, place, and time.  Skin: Skin is dry.  Psychiatric: She has a normal mood and affect. Thought content normal.    ED Course  Procedures (including critical care time) 61 year old smoker with COPD, presents with symptoms consistent with COPD  exacerbation, versus pneumonia.  We will treat her symptoms, and perform a chest x-ray, and laboratory testing for further evaluation.   Labs Reviewed  CBC  BASIC METABOLIC PANEL   No results found.   No diagnosis found.  ED ECG REPORT   Date: 02/18/2012  EKG Time:  7:43 AM  Rate: 98  Rhythm: normal sinus rhythm,    Axis: nl  Intervals:none  ST&T Change: nonspecific tw changes  Narrative Interpretation: nsr with pulm dz pattern   8:37 AM Still has exp wheezes with hacking cough    11:08 AM Still has cough, and expiratory wheezing, however, her oxygen level is 100% on room air.  I will discuss this with the resident and have them evaluate her.  Hopefully for discharge and followup in the office    11:24 AM Pt was seen by the [facility] resident.  She arranged appt. On Monday. Pt is agreeable with plan.  She already has antitussive and prednisone. Will add abx and analgesic.  MDM  COPD Bronchitis No pneumonia, hypoxia, resp distress.          Cheri Guppy, MD 02/18/12 1125

## 2012-02-18 NOTE — Progress Notes (Signed)
  Subjective:    Patient ID: [name], female    DOB: 01/05/1951, 61 y.o.   MRN: [id number]  HPI [name] is a 61 year old woman with history of COPD, Depression, HTN and DM II who presents to the ER for cough and shortness of breath. Of note, patient was in the ER two days prior and given prednisone taper and hydrocodone cough syrup. For unknown reasons she did not get scripts filled and returned to ER for worsening cough. No fevers, or chills. Cough is productive with yellow/green sputum. States it feels like prior COPD exacerbation. Complains of congestion symptoms along with rib pain from cough.   Review of Systems As per HPI    Objective:   Physical Exam  P:81 BP: 141/82 O2 sats: 100% RA  Gen: alert, appears stated age, NAD Lungs: mild wheezes, upper airway noise, hacking cough Heart: RRR, S1S2 Abdomen: soft, ND, NT Extr: no edema Neuro: nonfocal  CBC:    Component Value Date/Time   WBC 9.1 02/18/2012 0720   HGB 12.3 02/18/2012 0720   HCT 36.7 02/18/2012 0720   PLT 295 02/18/2012 0720   MCV 87.6 02/18/2012 0720   NEUTROABS 8.6* 10/06/2011 0600   LYMPHSABS 1.7 10/06/2011 0600   MONOABS 0.5 10/06/2011 0600   EOSABS 0.0 10/06/2011 0600   BASOSABS 0.0 10/06/2011 0600     BMET    Component Value Date/Time   NA 138 02/18/2012 0720   K 3.0* 02/18/2012 0720   CL 97 02/18/2012 0720   CO2 29 02/18/2012 0720   GLUCOSE 138* 02/18/2012 0720   BUN 14 02/18/2012 0720   CREATININE 0.67 02/18/2012 0720   CALCIUM 8.7 02/18/2012 0720   GFRNONAA >90 02/18/2012 0720   GFRAA >90 02/18/2012 0720    Imaging: CXR: No infiltrates      Assessment & Plan:   1.) Bronchitis/ Mild COPD flare - patient is stable clinically and does not warrant admission at this time. She is afebrile without leukocytosis and saturating 100% on room air. She was given hydrocodone cough syrup, prednisone, and will be given antibiotics per ED physician.   Plan: -Advised patient fill her scripts of  prednisone, hydrocodone syrup for cough, and antibiotic -Follow up appt this upcoming Monday with Dr. Scot Dock at 1045 am

## 2012-02-18 NOTE — Discharge Instructions (Signed)
Your chest x-ray does not show pneumonia.  Your blood tests do not show any severe illness.  Use Bactrim for infection and Percocet for pain.  Followup with your physician, next Monday, as scheduled.  Return for worse or uncontrolled symptoms

## 2012-02-18 NOTE — ED Notes (Signed)
MD at bedside. Admitting md. 

## 2012-02-21 ENCOUNTER — Ambulatory Visit (INDEPENDENT_AMBULATORY_CARE_PROVIDER_SITE_OTHER): Payer: Medicare Other | Admitting: Internal Medicine

## 2012-02-21 ENCOUNTER — Inpatient Hospital Stay (HOSPITAL_COMMUNITY)
Admission: AD | Admit: 2012-02-21 | Discharge: 2012-02-23 | DRG: 192 | Disposition: A | Discharge status: Home / Self Care | Payer: Medicare Other | Source: Ambulatory Visit | Attending: Internal Medicine | Admitting: Internal Medicine

## 2012-02-21 ENCOUNTER — Inpatient Hospital Stay (HOSPITAL_COMMUNITY): Payer: Medicare Other

## 2012-02-21 ENCOUNTER — Encounter (HOSPITAL_COMMUNITY): Payer: Self-pay | Admitting: Internal Medicine

## 2012-02-21 ENCOUNTER — Encounter: Payer: Self-pay | Admitting: Internal Medicine

## 2012-02-21 VITALS — BP 151/80 | HR 103 | Temp 98.6°F | Ht 65.0 in | Wt 230.2 lb

## 2012-02-21 DIAGNOSIS — J449 Chronic obstructive pulmonary disease, unspecified: Secondary | ICD-10-CM

## 2012-02-21 DIAGNOSIS — F341 Dysthymic disorder: Secondary | ICD-10-CM | POA: Diagnosis present

## 2012-02-21 DIAGNOSIS — J441 Chronic obstructive pulmonary disease with (acute) exacerbation: Secondary | ICD-10-CM | POA: Diagnosis not present

## 2012-02-21 DIAGNOSIS — R0602 Shortness of breath: Secondary | ICD-10-CM | POA: Diagnosis not present

## 2012-02-21 DIAGNOSIS — E119 Type 2 diabetes mellitus without complications: Secondary | ICD-10-CM | POA: Diagnosis present

## 2012-02-21 DIAGNOSIS — F329 Major depressive disorder, single episode, unspecified: Secondary | ICD-10-CM

## 2012-02-21 DIAGNOSIS — F17200 Nicotine dependence, unspecified, uncomplicated: Secondary | ICD-10-CM | POA: Diagnosis present

## 2012-02-21 DIAGNOSIS — I10 Essential (primary) hypertension: Secondary | ICD-10-CM | POA: Diagnosis present

## 2012-02-21 DIAGNOSIS — E1122 Type 2 diabetes mellitus with diabetic chronic kidney disease: Secondary | ICD-10-CM | POA: Diagnosis present

## 2012-02-21 DIAGNOSIS — E785 Hyperlipidemia, unspecified: Secondary | ICD-10-CM

## 2012-02-21 DIAGNOSIS — J45901 Unspecified asthma with (acute) exacerbation: Secondary | ICD-10-CM | POA: Diagnosis present

## 2012-02-21 DIAGNOSIS — R079 Chest pain, unspecified: Secondary | ICD-10-CM | POA: Diagnosis not present

## 2012-02-21 LAB — COMPREHENSIVE METABOLIC PANEL
ALT: 8 U/L (ref 0–35)
AST: 12 U/L (ref 0–37)
Albumin: 3.4 g/dL — ABNORMAL LOW (ref 3.5–5.2)
Alkaline Phosphatase: 90 U/L (ref 39–117)
BUN: 12 mg/dL (ref 6–23)
Chloride: 98 mEq/L (ref 96–112)
Glucose, Bld: 249 mg/dL — ABNORMAL HIGH (ref 70–99)
Potassium: 4.1 mEq/L (ref 3.5–5.1)
Sodium: 133 mEq/L — ABNORMAL LOW (ref 135–145)
Total Bilirubin: 0.3 mg/dL (ref 0.3–1.2)
Total Protein: 7 g/dL (ref 6.0–8.3)

## 2012-02-21 LAB — CBC
HCT: 36.5 % (ref 36.0–46.0)
MCH: 29.3 pg (ref 26.0–34.0)
MCV: 88.4 fL (ref 78.0–100.0)
Platelets: 328 10*3/uL (ref 150–400)
RDW: 13.2 % (ref 11.5–15.5)
WBC: 10 10*3/uL (ref 4.0–10.5)

## 2012-02-21 LAB — DIFFERENTIAL
Basophils Absolute: 0 10*3/uL (ref 0.0–0.1)
Eosinophils Absolute: 0 10*3/uL (ref 0.0–0.7)
Eosinophils Relative: 0 % (ref 0–5)
Lymphocytes Relative: 21 % (ref 12–46)
Monocytes Absolute: 0.4 10*3/uL (ref 0.1–1.0)
Monocytes Relative: 4 % (ref 3–12)

## 2012-02-21 LAB — GLUCOSE, CAPILLARY
Glucose-Capillary: 194 mg/dL — ABNORMAL HIGH (ref 70–99)
Glucose-Capillary: 174 mg/dL — ABNORMAL HIGH (ref 70–99)
Glucose-Capillary: 184 mg/dL — ABNORMAL HIGH (ref 70–99)

## 2012-02-21 MED ORDER — INSULIN ASPART 100 UNIT/ML ~~LOC~~ SOLN
0.0000 [IU] | Freq: Three times a day (TID) | SUBCUTANEOUS | Status: DC
  Administered 2012-02-22: 3 [IU] via SUBCUTANEOUS
  Administered 2012-02-22: 8 [IU] via SUBCUTANEOUS
  Administered 2012-02-22: 3 [IU] via SUBCUTANEOUS
  Administered 2012-02-23: 5 [IU] via SUBCUTANEOUS
  Administered 2012-02-23: 8 [IU] via SUBCUTANEOUS
  Administered 2012-02-23: 2 [IU] via SUBCUTANEOUS

## 2012-02-21 MED ORDER — CARVEDILOL 6.25 MG PO TABS
6.2500 mg | ORAL_TABLET | Freq: Two times a day (BID) | ORAL | Status: DC
  Administered 2012-02-21 – 2012-02-23 (×5): 6.25 mg via ORAL
  Filled 2012-02-21 (×6): qty 1

## 2012-02-21 MED ORDER — SIMVASTATIN 20 MG PO TABS
20.0000 mg | ORAL_TABLET | Freq: Every day | ORAL | Status: DC
  Administered 2012-02-22 – 2012-02-23 (×2): 20 mg via ORAL
  Filled 2012-02-21 (×3): qty 1

## 2012-02-21 MED ORDER — CITALOPRAM HYDROBROMIDE 40 MG PO TABS
40.0000 mg | ORAL_TABLET | Freq: Every day | ORAL | Status: DC
  Administered 2012-02-22 – 2012-02-23 (×2): 40 mg via ORAL
  Filled 2012-02-21 (×2): qty 1

## 2012-02-21 MED ORDER — ALBUTEROL SULFATE (5 MG/ML) 0.5% IN NEBU
2.5000 mg | INHALATION_SOLUTION | RESPIRATORY_TRACT | Status: DC | PRN
  Administered 2012-02-23: 2.5 mg via RESPIRATORY_TRACT
  Filled 2012-02-21: qty 0.5

## 2012-02-21 MED ORDER — IPRATROPIUM BROMIDE 0.02 % IN SOLN
0.5000 mg | Freq: Four times a day (QID) | RESPIRATORY_TRACT | Status: DC
  Administered 2012-02-21 – 2012-02-23 (×8): 0.5 mg via RESPIRATORY_TRACT
  Filled 2012-02-21 (×8): qty 2.5

## 2012-02-21 MED ORDER — FLUTICASONE-SALMETEROL 250-50 MCG/DOSE IN AEPB
1.0000 | INHALATION_SPRAY | Freq: Two times a day (BID) | RESPIRATORY_TRACT | Status: DC
  Administered 2012-02-21: 1 via RESPIRATORY_TRACT
  Filled 2012-02-21: qty 14

## 2012-02-21 MED ORDER — NAPROXEN 250 MG PO TABS
250.0000 mg | ORAL_TABLET | Freq: Once | ORAL | Status: AC | PRN
  Administered 2012-02-21: 250 mg via ORAL
  Filled 2012-02-21: qty 1

## 2012-02-21 MED ORDER — PREDNISONE 20 MG PO TABS
40.0000 mg | ORAL_TABLET | Freq: Every day | ORAL | Status: DC
  Administered 2012-02-22 – 2012-02-23 (×2): 40 mg via ORAL
  Filled 2012-02-21 (×3): qty 2

## 2012-02-21 MED ORDER — ONDANSETRON HCL 4 MG PO TABS: 4.0000 mg | ORAL_TABLET | Freq: Four times a day (QID) | ORAL | Status: DC | PRN

## 2012-02-21 MED ORDER — ONDANSETRON HCL 4 MG/2ML IJ SOLN: 4.0000 mg | Freq: Four times a day (QID) | INTRAMUSCULAR | Status: DC | PRN

## 2012-02-21 MED ORDER — FLUTICASONE PROPIONATE 50 MCG/ACT NA SUSP
2.0000 | Freq: Every day | NASAL | Status: DC
  Administered 2012-02-22 – 2012-02-23 (×2): 2 via NASAL
  Filled 2012-02-21: qty 16

## 2012-02-21 MED ORDER — IPRATROPIUM BROMIDE 0.02 % IN SOLN
0.5000 mg | RESPIRATORY_TRACT | Status: DC | PRN
  Administered 2012-02-23: 0.5 mg via RESPIRATORY_TRACT
  Filled 2012-02-21: qty 2.5

## 2012-02-21 MED ORDER — ENOXAPARIN SODIUM 40 MG/0.4ML ~~LOC~~ SOLN
40.0000 mg | SUBCUTANEOUS | Status: DC
  Administered 2012-02-21 – 2012-02-22 (×2): 40 mg via SUBCUTANEOUS
  Filled 2012-02-21 (×3): qty 0.4

## 2012-02-21 MED ORDER — LISINOPRIL 20 MG PO TABS
20.0000 mg | ORAL_TABLET | Freq: Every day | ORAL | Status: DC
  Administered 2012-02-22 – 2012-02-23 (×2): 20 mg via ORAL
  Filled 2012-02-21 (×2): qty 1

## 2012-02-21 MED ORDER — ACETAMINOPHEN 325 MG PO TABS
650.0000 mg | ORAL_TABLET | Freq: Four times a day (QID) | ORAL | Status: DC | PRN
  Administered 2012-02-23: 650 mg via ORAL
  Filled 2012-02-21: qty 2

## 2012-02-21 MED ORDER — ALBUTEROL SULFATE (5 MG/ML) 0.5% IN NEBU
2.5000 mg | INHALATION_SOLUTION | Freq: Four times a day (QID) | RESPIRATORY_TRACT | Status: DC
  Administered 2012-02-21 – 2012-02-23 (×8): 2.5 mg via RESPIRATORY_TRACT
  Filled 2012-02-21 (×8): qty 0.5

## 2012-02-21 MED ORDER — ACETAMINOPHEN 650 MG RE SUPP: 650.0000 mg | Freq: Four times a day (QID) | RECTAL | Status: DC | PRN

## 2012-02-21 MED ORDER — DOXYCYCLINE HYCLATE 100 MG PO TABS
100.0000 mg | ORAL_TABLET | Freq: Two times a day (BID) | ORAL | Status: DC
  Administered 2012-02-21 – 2012-02-23 (×4): 100 mg via ORAL
  Filled 2012-02-21 (×5): qty 1

## 2012-02-21 MED ORDER — MORPHINE SULFATE 2 MG/ML IJ SOLN
2.0000 mg | INTRAMUSCULAR | Status: DC | PRN
  Administered 2012-02-21 – 2012-02-22 (×2): 2 mg via INTRAVENOUS
  Administered 2012-02-22: 4 mg via INTRAVENOUS
  Filled 2012-02-21: qty 1
  Filled 2012-02-21: qty 2
  Filled 2012-02-21: qty 1

## 2012-02-21 MED ORDER — TIOTROPIUM BROMIDE MONOHYDRATE 18 MCG IN CAPS: 18.0000 ug | ORAL_CAPSULE | Freq: Every day | RESPIRATORY_TRACT | Status: DC

## 2012-02-21 MED ORDER — LORATADINE 10 MG PO TABS
10.0000 mg | ORAL_TABLET | Freq: Every day | ORAL | Status: DC
  Administered 2012-02-22 – 2012-02-23 (×2): 10 mg via ORAL
  Filled 2012-02-21 (×2): qty 1

## 2012-02-21 NOTE — H&P (Signed)
Hospital Admission Note Date: 02/21/2012  Patient name: [name] Medical record number: [id number] Date of birth: 08/05/1951 Age: 61 y.o. Gender: female PCP: Elyse Jarvis, MD, MD  Medical Service: Internal Med  Attending physician:  Rogelia Boga    Pager: Resident (R2/R3): Wells     Pager: [phone] Resident (R1): Berlinda Last    [phone]  Chief Complaint: Shortness of breath  History of Present Illness: Pt is a 61 yo F with a h/o COPD and tobacco abuse who presents for SOB.  This has been coughing with increased sputum production for the past 2 weeks, worsening over the past 9 days. She went to the urgent care center on 4/6 (DuoNeb and prednisone), the ED on 4/9 (given, prednisone and cough suppressant) and the ED again on 4/12 (continued antitussin prednisone, Advair antibiotic). She's been on Bactrim and prednisone.  The patient says she has not been taking her Spiriva or Advair since they make her cough, and she has not had albuterol in over a year. Presently, she has green sputum production, shortness of breath, pain in her ribs with coughing, rhinorrhea, and some watery eyes. She denies fevers but endorses night sweats. She denies chest pain, leg swelling, or hemoptysis. She does continue to smoke, but typically only takes a few puffs at a time before stamping out a cigarette.   Past Medical History: Past Medical History  Diagnosis Date  . Hyperlipidemia   . Hypertension   . Tobacco abuse   . COPD (chronic obstructive pulmonary disease)     History of multiple hospital admissions for exercabation   . Asthma   . Breast cancer 1991    s/p lumpectomy, chemotherapy and radiation therapy in 1991. Mammogram in 2007 was normal.  . Sigmoid diverticulitis 80/2008  . Anxiety   . Depression   . Obesity   . GERD (gastroesophageal reflux disease)   . Heart murmur 10/05/11    "first time I ever heard I had one was today"  . Pneumonia   . Shortness of breath 10/05/11    "at rest;  lying down; w/exertion"  . Diabetes mellitus   . Bronchitis     h/o  . Diarrhea     h/o  . Constipated     h/o   Past Surgical History  Procedure Date  . Dobutamine stress echo 08/2004    Inferior ischemia, normal LV systolic function, no significant CAD  . Abdominal hysterectomy   . Breast surgery 1991    lumphectomy right breast  . Neck surgery 2012    "Dr. [name]  put plate in; did something to my vertebrae"    Meds: Prior to Admission medications   Medication Sig Start Date End Date Taking? Authorizing Provider  albuterol (PROVENTIL) (2.5 MG/3ML) 0.083% nebulizer solution Take 2.5 mg by nebulization every 4 (four) hours as needed. For shortness of breath   Yes Historical Provider, MD  carvedilol (COREG) 6.25 MG tablet Take 6.25 mg by mouth 2 (two) times daily with a meal.   Yes Historical Provider, MD  citalopram (CELEXA) 40 MG tablet Take 1 tablet (40 mg total) by mouth daily. 10/22/11 10/21/12 Yes Sunday Spillers, MD  fluticasone (FLONASE) 50 MCG/ACT nasal spray Place 2 sprays into the nose daily. 12/17/11  Yes Farley Ly, MD  Fluticasone-Salmeterol (ADVAIR DISKUS) 250-50 MCG/DOSE AEPB Inhale 1 puff into the lungs every 12 (twelve) hours. 04/29/11  Yes Elyse Jarvis, MD  glipiZIDE (GLUCOTROL XL) 5 MG 24 hr tablet Take 5 mg by mouth daily.  Yes Historical Provider, MD  HYDROcodone-homatropine (HYCODAN) 5-1.5 MG/5ML syrup Take 5 mLs by mouth every 6 (six) hours as needed. For cough 02/18/12 02/28/12 Yes Amanjot Sidhu, MD  hydrocortisone cream 0.5 % Apply topically 2 (two) times daily.   Yes Historical Provider, MD  lisinopril-hydrochlorothiazide (PRINZIDE,ZESTORETIC) 20-25 MG per tablet Take 1 tablet by mouth daily.   Yes Historical Provider, MD  loratadine (CLARITIN) 10 MG tablet Take 1 tablet (10 mg total) by mouth daily. 03/03/11 03/02/12 Yes Elyse Jarvis, MD  oxyCODONE-acetaminophen (PERCOCET) 5-325 MG per tablet Take 2 tablets by mouth every 4 (four) hours as needed. For  pain 02/18/12 02/28/12 Yes Cheri Guppy, MD  pravastatin (PRAVACHOL) 40 MG tablet Take 1 tablet (40 mg total) by mouth daily. 04/29/11  Yes Elyse Jarvis, MD  predniSONE (DELTASONE) 20 MG tablet Take 20 mg by mouth daily. 02/18/12 02/28/12 Yes Amanjot Sidhu, MD  sulfamethoxazole-trimethoprim (BACTRIM DS,SEPTRA DS) 800-160 MG per tablet Take 1 tablet by mouth every 12 (twelve) hours. 02/18/12 02/28/12 Yes Cheri Guppy, MD  tiotropium (SPIRIVA) 18 MCG inhalation capsule Place 18 mcg into inhaler and inhale daily.   Yes Historical Provider, MD    Allergies: Review of patient's allergies indicates no known allergies.  Family Hx: Family History  Problem Relation Age of Onset  . Cancer Mother     Social Hx: History   Social History  . Marital Status: Married    Spouse Name: N/A    Number of Children: N/A  . Years of Education: N/A   Occupational History  . Not on file.   Social History Main Topics  . Smoking status: Current Some Day Smoker -- 0.3 packs/day for 40 years    Types: Cigarettes  . Smokeless tobacco: Never Used  . Alcohol Use: 2.4 oz/week    4 Cans of beer per week     "only on the weekends"  . Drug Use: No  . Sexually Active: Yes   Other Topics Concern  . Not on file   Social History Narrative   Lives in [place] with her husband.Takes care of 3 grand children.Trying to find a job, has financial difficulties.   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  Lives with husband of over 40 years.   Review of Systems: As per HPI  Physical Exam: 98.6   151/80   103   16    96%/RA GEN: No apparent distress.  Alert and oriented x 3.  Pleasant, conversant, and cooperative to exam. HEENT: head is autraumatic and normocephalic.  Neck is supple without palpable masses or lymphadenopathy.  No JVD or carotid bruits.  Vision intact.  EOMI.  PERRLA.  Sclerae anicteric.  Conjunctivae without pallor or injection. Mucous membranes are moist.  Oropharynx  is without erythema, exudates, or other abnormal lesions. RESP:  Mild, diffuse end-expiratory wheezing b/l.  No rhonchi appreciated.  CARDIOVASCULAR: regular rate, normal rhythm.  Clear S1, S2, no murmurs, gallops, or rubs. ABDOMEN: soft, non-tender, non-distended.  Bowels sounds present in all quadrants and normoactive.  No palpable masses. EXT: warm and dry.  Peripheral pulses equal, intact, and +2 globally.  No clubbing or cyanosis.  No edema in b/l lower extremeties   Lab results: CBG:  Basename 02/21/12 1157  GLUCAP 194*   Urine Drug Screen: Drugs of Abuse     Component Value Date/Time   LABOPIA NONE DETECTED 12/29/2009 0633   COCAINSCRNUR NONE DETECTED 12/29/2009 0633   LABBENZ NONE DETECTED 12/29/2009 1610  AMPHETMU NONE DETECTED 12/29/2009 0633   THCU NONE DETECTED 12/29/2009 0633   LABBARB  Value: NONE DETECTED        DRUG SCREEN FOR MEDICAL PURPOSES ONLY.  IF CONFIRMATION IS NEEDED FOR ANY PURPOSE, NOTIFY LAB WITHIN 5 DAYS.        LOWEST DETECTABLE LIMITS FOR URINE DRUG SCREEN Drug Class       Cutoff (ng/mL) Amphetamine      1000 Barbiturate      200 Benzodiazepine   200 Tricyclics       300 Opiates          300 Cocaine          300 THC              50 12/29/2009 0633     Assessment & Plan by Problem: # SOB Patient presents with progressive shortness of breath despite multiple visits to the urgent care the ED over the past few weeks. Presented diagnosis COPD, though no PFTs. Given increased sputum production, cough, and clean chest x-ray as of 4/12, likely a mild COPD exacerbation in the setting of no albuterol access, delay in obtaining prednisone and antibiotics. Other causes of shortness of breath including cardiac etiologies or PE seems less likely in the setting of no chest pain and stable vital signs. Oxygenation is adequate on room air at present, but we will initiate therapy for COPD exacerbation. - Duo nebs q6 - albuterol q4 PRN - doxycycline 100mg  PO bid - prednisone  40mg  daily - flonase - claritin - con't advair - con't spiriva - bmet (was hypoK on 4/12) - CXR 2 view  # HTN - con't coreg - con't lisinopril - hold HCTZ  # DM - Hb A1c - SSI  # Depression - con't celexa  # Ppx - lovenox

## 2012-02-21 NOTE — Progress Notes (Signed)
Patient ID: [name], female   DOB: 21-Dec-1950, 61 y.o.   MRN: [id number]  61 y/o w with pmh of COPD, HTN, HLD, Anxiety/Depression  And DM comes to clinic c/o SOb Ongoing for past few months but has got worse in last 2-3 weeks. Was seen once in urgent care and twice in ER, the last time was on past Friday (3 days ago) She was diagnosed with COPD exacerbation and bronchitis and discharged with bactrim DS and prednisone Rx which she has been compliant with .  She did not receive any inhalers from her mail order pharmacy and has not been using albuterol for long time (she says almost a year!!) She was using spiriva and advair until her symptoms got worse 2 weeks ago and stopped taking it at that time because they were making her cough more.   Her complaint include cough productive of green sputum, present day and night, worse with exertion, lying down or taking a deep breath. She is having headaches as well for past 1 weeks. She is also short of breath most of the time. She denies any chest pain, leg swelling, hemoptysis. She has not checked her temperature but feels warm multiple times during the day.  She continues to smoke upto 5 cig/day. Works as a Visual merchandiser. Has social support at home (husband) and has Medicare.   Physical exam   General Appearance:     Filed Vitals:   02/21/12 1038  BP: 151/80  Pulse: 103  Temp: 98.6 F (37 C)  TempSrc: Oral  Height: 5\' 5"  (1.651 m)  Weight: 230 lb 3.2 oz (104.418 kg)  SpO2: 96%     Mild respi distress, uncomfortable because of cough and headache, tired looking  Head:    Normocephalic, without obvious abnormality, atraumatic  Eyes:    PERRL, conjunctiva/corneas clear, EOM's intact, fundi    benign, both eyes       Neck:   Supple, symmetrical, trachea midline, no adenopathy;       thyroid:  No enlargement/tenderness/nodules; no carotid   bruit or JVD  Lungs:   B/l diffuse wheezing, equal air entry b/l   Chest wall:    No  tenderness or deformity  Heart:    Regular rate and rhythm, S1 and S2 normal, no murmur, rub   or gallop  Abdomen:     Soft, non-tender, bowel sounds active all four quadrants,    no masses, no organomegaly  Extremities:   Extremities normal, atraumatic, no cyanosis or edema  Pulses:   2+ and symmetric all extremities  Skin:   Skin color, texture, turgor normal, no rashes or lesions  Neurologic:  nonfocal grossly    ROS- as per HPI

## 2012-02-21 NOTE — Assessment & Plan Note (Signed)
Mild COPD exacerbation with ? Bronchitis. Minimal respiratory distress. No desaturation in at rest Out of inhalers for >1 week  Plan - admit to regular bed - nebs and iv steroid - Antibiotics - Explain to her about inhalers that she needs to use as outpatient - smoking cessation counseling - PFTs as outpatient once she feels better

## 2012-02-21 NOTE — Progress Notes (Signed)
Report was called to Tabitha on [phone].  Pt transferred via wheelchair to [phone].  Angelina Ok, RN 02/21/2012 12:06 PM.

## 2012-02-22 DIAGNOSIS — J449 Chronic obstructive pulmonary disease, unspecified: Secondary | ICD-10-CM

## 2012-02-22 LAB — BASIC METABOLIC PANEL
CO2: 24 mEq/L (ref 19–32)
Calcium: 8.9 mg/dL (ref 8.4–10.5)
Chloride: 99 mEq/L (ref 96–112)
GFR calc Af Amer: >90 mL/min (ref >90)
GFR calc non Af Amer: >90 mL/min (ref >90)
Glucose, Bld: 182 mg/dL — ABNORMAL HIGH (ref 70–99)
Potassium: 3.5 mEq/L (ref 3.5–5.1)
Sodium: 136 mEq/L (ref 135–145)

## 2012-02-22 LAB — GLUCOSE, CAPILLARY
Glucose-Capillary: 156 mg/dL — ABNORMAL HIGH (ref 70–99)
Glucose-Capillary: 267 mg/dL — ABNORMAL HIGH (ref 70–99)

## 2012-02-22 LAB — HEMOGLOBIN A1C
Hgb A1c MFr Bld: 7.6 % — ABNORMAL HIGH (ref <5.7)
Mean Plasma Glucose: 171 mg/dL — ABNORMAL HIGH (ref <117)

## 2012-02-22 LAB — MRSA PCR SCREENING: MRSA by PCR: NEGATIVE

## 2012-02-22 MED ORDER — TRAMADOL HCL 50 MG PO TABS
50.0000 mg | ORAL_TABLET | Freq: Four times a day (QID) | ORAL | Status: DC | PRN
  Administered 2012-02-22: 50 mg via ORAL
  Filled 2012-02-22: qty 1

## 2012-02-22 MED ORDER — MORPHINE SULFATE 2 MG/ML IJ SOLN
2.0000 mg | INTRAMUSCULAR | Status: DC | PRN
  Administered 2012-02-22 – 2012-02-23 (×4): 2 mg via INTRAVENOUS
  Filled 2012-02-22 (×4): qty 1

## 2012-02-22 MED ORDER — GUAIFENESIN 100 MG/5ML PO SYRP
200.0000 mg | ORAL_SOLUTION | ORAL | Status: DC | PRN
  Administered 2012-02-22: 200 mg via ORAL
  Filled 2012-02-22: qty 118

## 2012-02-22 MED ORDER — GUAIFENESIN ER 600 MG PO TB12
1200.0000 mg | ORAL_TABLET | Freq: Two times a day (BID) | ORAL | Status: DC
  Administered 2012-02-22 – 2012-02-23 (×3): 1200 mg via ORAL
  Filled 2012-02-22 (×4): qty 2

## 2012-02-22 MED ORDER — HYDROCOD POLST-CHLORPHEN POLST 10-8 MG/5ML PO LQCR
5.0000 mL | Freq: Two times a day (BID) | ORAL | Status: DC
  Administered 2012-02-22 – 2012-02-23 (×3): 5 mL via ORAL
  Filled 2012-02-22 (×3): qty 5

## 2012-02-22 MED ORDER — HYDROCODONE-HOMATROPINE 5-1.5 MG/5ML PO SYRP: 5.0000 mL | ORAL_SOLUTION | ORAL | Status: DC | PRN

## 2012-02-22 MED ORDER — DIPHENHYDRAMINE HCL 25 MG PO CAPS
25.0000 mg | ORAL_CAPSULE | Freq: Every evening | ORAL | Status: DC | PRN
  Administered 2012-02-22: 25 mg via ORAL
  Filled 2012-02-22: qty 1

## 2012-02-22 NOTE — H&P (Signed)
Internal Medicine Teaching Service Attending Note Date: 02/22/2012  Patient name: [name]  Medical record number: [id number]  Date of birth: 1951/08/28   I have seen and evaluated [name] and discussed their care with the Residency Team. Please see Dr Danice Goltz H&P for full details. Briefly, Ms Fickling was admitted for 2 weeks ox sxs c/w COPD exacerbation that were not fully treated at home. Pt has only been on ABX 2 days. Has been out of alb MDI for 1 year. Has alb neb but only uses it when really sick. Doesn't use Advair or spiriva b/c makes her cough.   On exam, she is able to speak in full sentences with no resp distress at all. She is unable to take deep inspirations bc it makes her cough. Very poor air movment on auscultation therefore wheezing was not able to be appreciated. HRRR. Gait normal  I agree with the formulated Assessment and Plan with the following changes:  1. Acute COPD exac - pt states had PFT's in 2012 prior to OR but they are not in EPIC. Will attempt to get. Otherwise, ABX, nebs, steroids. Change from DMI to MDI's for better compliance. Refer to [facility] and COPD group. Tobacco cessation.  2. DM controlled type 2 - cont outpt mgmt  3. HTN - continue home meds. She has mild hyponatremia and HCTZ held.  Likely D/C in AM.

## 2012-02-22 NOTE — Progress Notes (Signed)
Clinical Social Work-CSW facilitated referral for [facility] per MD-CSW will follow with full assessment to discuss medication compliance and assistance.  Bonnye Fava, MSW, [phone]

## 2012-02-22 NOTE — Progress Notes (Signed)
Thank you to Bonnye Fava LCSW for this referral. For any additional questions or new referrals please contact Anibal Henderson BSN RN [facility] Liaison at [phone]. Patient has requested assistance with getting a prescription for Albuterol HHN PRN.  If it is contraindicated please advise the patient as needed.  Patient is appropriate for [facility] services.  We will provide a transition of care call at discharge.

## 2012-02-22 NOTE — Progress Notes (Addendum)
Subjective:  Patient reports continued bad cough.  Not very SOB. Thinks the advair is making it worse.  Has R lower lateral thorax pain and headache associated with the coughing.  Objective: Vital signs in last 24 hours: Filed Vitals:   02/21/12 2156 02/22/12 0602 02/22/12 0801 02/22/12 0937  BP: 143/81 145/83 157/87   Pulse: 86 81 96   Temp: 99 F (37.2 C) 98.9 F (37.2 C) 98.3 F (36.8 C)   TempSrc:   Oral   Resp: 20 20 20    SpO2: 97% 97% 98% 96%     Physical Exam:  GEN: No apparent distress. Alert and oriented x 3. Pleasant, conversant, and cooperative to exam.  HEENT: head is autraumatic and normocephalic. No JVD.  Vision intact. EOMI. PERRLA. Sclerae anicteric. Conjunctivae without pallor or injection. Mucous membranes are moist. Oropharynx is without erythema, exudates, or other abnormal lesions.   RESP: Mild, diffuse end-expiratory wheezing b/l. No rhonchi appreciated. Tender to palpation over right lower ribs.    CARDIOVASCULAR: regular rate, normal rhythm. Clear S1, S2, no murmurs, gallops, or rubs.   EXT: warm and dry. Peripheral pulses equal, intact, and +2 globally. No clubbing or cyanosis. No edema in b/l lower extremeties     Lab Results: Basic Metabolic Panel:  Lab 02/22/12 1610 02/21/12 1836  NA 136 133*  K 3.5 4.1  CL 99 98  CO2 24 24  GLUCOSE 182* 249*  BUN 13 12  CREATININE 0.69 0.77  CALCIUM 8.9 8.9  MG -- --  PHOS -- --   Liver Function Tests:  Lab 02/21/12 1836  AST 12  ALT 8  ALKPHOS 90  BILITOT 0.3  PROT 7.0  ALBUMIN 3.4*   CBC:  Lab 02/21/12 1836 02/18/12 0720  WBC 10.0 9.1  NEUTROABS 7.5 --  HGB 12.1 12.3  HCT 36.5 36.7  MCV 88.4 87.6  PLT 328 295   CBG:  Lab 02/22/12 0547 02/21/12 2145 02/21/12 1636 02/21/12 1157 02/15/12 1420  GLUCAP 156* 184* 174* 194* 201*   Hemoglobin A1C:  Lab 02/21/12 1836  HGBA1C 7.6*    Urinalysis:  Lab 02/15/12 1208  COLORURINE YELLOW  LABSPEC 1.013  PHURINE 6.0  GLUCOSEU  NEGATIVE  HGBUR NEGATIVE  BILIRUBINUR NEGATIVE  KETONESUR NEGATIVE  PROTEINUR NEGATIVE  UROBILINOGEN 1.0  NITRITE NEGATIVE  LEUKOCYTESUR NEGATIVE    Micro Results: Recent Results (from the past 240 hour(s))  MRSA PCR SCREENING     Status: Normal   Collection Time   02/22/12  6:04 AM      Component Value Range Status Comment   MRSA by PCR NEGATIVE  NEGATIVE  Final    Studies/Results: X-ray Chest Pa And Lateral   02/21/2012  *RADIOLOGY REPORT*  Clinical Data: Shortness of breath and chest pain  CHEST - 2 VIEW  Comparison: 02/18/2012  Findings: Heart size and mediastinal contours are normal.  No pleural effusion or pulmonary edema.  There are surgical clips noted within the right axilla.  The lungs appear hyperinflated but are clear.  IMPRESSION:  1.  No acute cardiopulmonary abnormalities.  Original Report Authenticated By: Rosealee Albee, M.D.   Medications: I have reviewed the patient's current medications. Scheduled Meds:   . albuterol  2.5 mg Nebulization Q6H   And  . ipratropium  0.5 mg Nebulization Q6H  . carvedilol  6.25 mg Oral BID WC  . citalopram  40 mg Oral Daily  . doxycycline  100 mg Oral Q12H  . enoxaparin  40 mg Subcutaneous Q24H  .  fluticasone  2 spray Each Nare Daily  . Fluticasone-Salmeterol  1 puff Inhalation Q12H  . insulin aspart  0-15 Units Subcutaneous TID WC  . lisinopril  20 mg Oral Daily  . loratadine  10 mg Oral Daily  . predniSONE  40 mg Oral Q breakfast  . simvastatin  20 mg Oral q1800  . DISCONTD: tiotropium  18 mcg Inhalation Daily   Continuous Infusions:  PRN Meds:.acetaminophen, acetaminophen, albuterol, guaifenesin, HYDROcodone-homatropine, ipratropium, morphine, naproxen, ondansetron (ZOFRAN) IV, ondansetron, DISCONTD: morphine, DISCONTD: traMADol  Assessment/Plan: Active Problems:  DIABETES MELLITUS, TYPE II  Essential hypertension, benign  COPD  # SOB / Cough Cough continues without improvement.  SOB seems to be improving.   Presented diagnosis COPD, though no PFTs. Given increased sputum production, cough, and clean chest x-ray as of 4/12, likely a mild COPD exacerbation in the setting of no albuterol access, delay in obtaining prednisone and antibiotics. Other causes of shortness of breath including cardiac etiologies or PE seems less likely in the setting of no chest pain and stable vital signs. Oxygenation is adequate on room air at present, but we will initiate therapy for COPD exacerbation. Will continued scheduled nebulizers, abx, prednisone.  Will discontinue Advair since she is on oral steroids for now, switch to a MDI steroid or MDI Advair on discharge.  Hold spiriva until discharge as well since she is being covered with ipratropium for anticholinergic.  CXRay clear per radiology. - Duo nebs q6  - albuterol q4 PRN  - doxycycline 100mg  PO bid  - prednisone 40mg  daily  - flonase  - claritin  -Started hycodan PRN for cough -Morphine PRN pain -Benadryl PRN for sleep since her coughing has kept her awake, may also help dry her up  # HTN  - con't coreg  - con't lisinopril  - hold HCTZ   # DM  - Hb A1c 7.6%.   - SSI -Will restart glipizide on discharge, better control can be assessed as an outpatient   # Depression  - con't celexa   # Ppx  - lovenox     LOS: 1 day   [name], BRAD 02/22/2012, 10:22 AM

## 2012-02-23 LAB — BASIC METABOLIC PANEL
BUN: 12 mg/dL (ref 6–23)
CO2: 23 mEq/L (ref 19–32)
Calcium: 9.3 mg/dL (ref 8.4–10.5)
Chloride: 101 mEq/L (ref 96–112)
Creatinine, Ser: 0.64 mg/dL (ref 0.50–1.10)
Glucose, Bld: 132 mg/dL — ABNORMAL HIGH (ref 70–99)

## 2012-02-23 LAB — GLUCOSE, CAPILLARY
Glucose-Capillary: 128 mg/dL — ABNORMAL HIGH (ref 70–99)
Glucose-Capillary: 260 mg/dL — ABNORMAL HIGH (ref 70–99)

## 2012-02-23 MED ORDER — FLUTICASONE-SALMETEROL 115-21 MCG/ACT IN AERO: 2.0000 | INHALATION_SPRAY | Freq: Two times a day (BID) | RESPIRATORY_TRACT | Status: DC

## 2012-02-23 MED ORDER — PREDNISONE 10 MG PO TABS: ORAL_TABLET | ORAL | Status: DC

## 2012-02-23 MED ORDER — ALBUTEROL SULFATE HFA 108 (90 BASE) MCG/ACT IN AERS
2.0000 | INHALATION_SPRAY | Freq: Once | RESPIRATORY_TRACT | Status: AC
  Administered 2012-02-23: 2 via RESPIRATORY_TRACT
  Filled 2012-02-23: qty 6.7

## 2012-02-23 MED ORDER — DOXYCYCLINE HYCLATE 100 MG PO TABS: 100.0000 mg | ORAL_TABLET | Freq: Two times a day (BID) | ORAL | Status: AC

## 2012-02-23 MED ORDER — ALBUTEROL SULFATE (2.5 MG/3ML) 0.083% IN NEBU: 2.5000 mg | INHALATION_SOLUTION | RESPIRATORY_TRACT | Status: DC | PRN

## 2012-02-23 MED ORDER — PNEUMOCOCCAL VAC POLYVALENT 25 MCG/0.5ML IJ INJ: 0.5000 mL | INJECTION | INTRAMUSCULAR | Status: DC

## 2012-02-23 MED ORDER — ALBUTEROL SULFATE HFA 108 (90 BASE) MCG/ACT IN AERS: 2.0000 | INHALATION_SPRAY | Freq: Four times a day (QID) | RESPIRATORY_TRACT | Status: DC | PRN

## 2012-02-23 MED ORDER — HYDROCOD POLST-CHLORPHEN POLST 10-8 MG/5ML PO LQCR: 5.0000 mL | Freq: Two times a day (BID) | ORAL | Status: DC | PRN

## 2012-02-23 MED ORDER — OMEPRAZOLE 20 MG PO CPDR: 20.0000 mg | DELAYED_RELEASE_CAPSULE | Freq: Every day | ORAL | Status: DC

## 2012-02-23 MED ORDER — ALBUTEROL SULFATE HFA 108 (90 BASE) MCG/ACT IN AERS: 2.0000 | INHALATION_SPRAY | RESPIRATORY_TRACT | Status: DC | PRN

## 2012-02-23 NOTE — Progress Notes (Signed)
Internal Medicine Teaching Service Attending Note Date: 02/23/2012  Patient name: [name]  Medical record number: [id number]  Date of birth: [birth year]    This patient has been seen and discussed with the house staff. Please see their note for complete details. I concur with their findings with the following additions/corrections:  [name] still has occasional coughing episodes. She is on all orals. Her air movement is better today. She is stable for D/C. We have arranged as much support at home as we can with [facility] and the COPD grp involvement. We will work on getting her past PFT results. Change from DPI to MDI's and add short term PPI.  Lysha Schrade 02/23/2012, 11:52 AM

## 2012-02-23 NOTE — Progress Notes (Signed)
Subjective: Reports SOB only during coughing spells.  Frequency of coughing spells improving significantly, but still had one this morning and then had another while breathing for my lung exam.  Afebrile.    Objective: Vital signs in last 24 hours: Filed Vitals:   02/22/12 1300 02/22/12 2149 02/23/12 0409 02/23/12 0600  BP: 122/78 130/76  118/70  Pulse: 83 72  65  Temp: 99 F (37.2 C) 98.5 F (36.9 C)  98.8 F (37.1 C)  TempSrc: Oral Oral  Oral  Resp: 20 20  18   SpO2: 97% 98% 96% 95%   Physical Exam:  GEN: No apparent distress. Alert and oriented x 3. Pleasant, conversant, and cooperative to exam.   HEENT: head is autraumatic and normocephalic. No JVD. Vision intact. EOMI. PERRLA. Sclerae anicteric. Conjunctivae without pallor or injection. Mucous membranes are moist. Oropharynx is without erythema, exudates, or other abnormal lesions.   RESP: Mild, diffuse end-expiratory wheezing b/l. Poor airmovement diffusely.  No rhonchi appreciated. Tender to palpation over right lower ribs. No overlying ecchymosis.    CARDIOVASCULAR: regular rate, normal rhythm. Clear S1, S2, no murmurs, gallops, or rubs.   EXT: warm and dry. Peripheral pulses equal, intact, and +2 globally. No clubbing or cyanosis. No edema in b/l lower extremeties   Lab Results: Basic Metabolic Panel:  Lab 02/22/12 1610 02/21/12 1836  NA 136 133*  K 3.5 4.1  CL 99 98  CO2 24 24  GLUCOSE 182* 249*  BUN 13 12  CREATININE 0.69 0.77  CALCIUM 8.9 8.9  MG -- --  PHOS -- --   Liver Function Tests:  Lab 02/21/12 1836  AST 12  ALT 8  ALKPHOS 90  BILITOT 0.3  PROT 7.0  ALBUMIN 3.4*   CBC:  Lab 02/21/12 1836 02/18/12 0720  WBC 10.0 9.1  NEUTROABS 7.5 --  HGB 12.1 12.3  HCT 36.5 36.7  MCV 88.4 87.6  PLT 328 295   CBG:  Lab 02/23/12 0653 02/22/12 2222 02/22/12 1621 02/22/12 1106 02/22/12 0547 02/21/12 2145  GLUCAP 128* 134* 267* 154* 156* 184*   Hemoglobin A1C:  Lab 02/21/12 1836  HGBA1C 7.6*    Micro Results: Recent Results (from the past 240 hour(s))  MRSA PCR SCREENING     Status: Normal   Collection Time   02/22/12  6:04 AM      Component Value Range Status Comment   MRSA by PCR NEGATIVE  NEGATIVE  Final    Studies/Results: X-ray Chest Pa And Lateral   02/21/2012  *RADIOLOGY REPORT*  Clinical Data: Shortness of breath and chest pain  CHEST - 2 VIEW  Comparison: 02/18/2012  Findings: Heart size and mediastinal contours are normal.  No pleural effusion or pulmonary edema.  There are surgical clips noted within the right axilla.  The lungs appear hyperinflated but are clear.  IMPRESSION:  1.  No acute cardiopulmonary abnormalities.  Original Report Authenticated By: Rosealee Albee, M.D.   Medications: I have reviewed the patient's current medications. Scheduled Meds:   . albuterol  2.5 mg Nebulization Q6H   And  . ipratropium  0.5 mg Nebulization Q6H  . carvedilol  6.25 mg Oral BID WC  . chlorpheniramine-HYDROcodone  5 mL Oral Q12H  . citalopram  40 mg Oral Daily  . doxycycline  100 mg Oral Q12H  . enoxaparin  40 mg Subcutaneous Q24H  . fluticasone  2 spray Each Nare Daily  . guaiFENesin  1,200 mg Oral BID  . insulin aspart  0-15 Units Subcutaneous  TID WC  . lisinopril  20 mg Oral Daily  . loratadine  10 mg Oral Daily  . pneumococcal 23 valent vaccine  0.5 mL Intramuscular Tomorrow-1000  . predniSONE  40 mg Oral Q breakfast  . simvastatin  20 mg Oral q1800  . DISCONTD: Fluticasone-Salmeterol  1 puff Inhalation Q12H   Continuous Infusions:  PRN Meds:.acetaminophen, acetaminophen, albuterol, diphenhydrAMINE, ipratropium, morphine, ondansetron (ZOFRAN) IV, ondansetron, DISCONTD: guaifenesin, DISCONTD: HYDROcodone-homatropine, DISCONTD: morphine   Assessment/Plan:  # SOB / Cough  Cough improving. SOB only during coughing spells.  Cough is worse with laying down. Presented diagnosis COPD, though no PFTs. Given increased sputum production, cough, and clean chest x-ray  as of 4/12, likely a mild COPD exacerbation in the setting of no albuterol access, delay in obtaining prednisone and antibiotics. Oxygenation is good on room air at present, but we will initiate therapy for COPD exacerbation. Will continued scheduled nebulizers, abx, prednisone. Will discontinue Advair since she is on oral steroids for now, switch to a MDI steroid or MDI Advair on discharge. Hold spiriva until discharge as well since she is being covered with ipratropium for anticholinergic. CXRay clear per radiology.  - Duo nebs q6 , up to Q4HPRN - doxycycline 100mg  PO bid  - prednisone 40mg  daily  - flonase  - claritin  -Continue Tussionex and Mucinex  -Morphine PRN pain  -Benadryl PRN for sleep since her coughing has kept her awake, may also help dry her up  -Patient is likely ready for discharge today.  Is going to see over the next two hours if she can lay flat without too much coughing since laying flat can cause coughing.  My team will see her again after that and if she tolerates laying flat she will be ready for discharge at that time.  Also will consider starting PPI on discharge given history of acid reflux and cough worse laying flat.    # HTN : BPs look good - con't coreg  - con't lisinopril  - hold HCTZ   # DM: One high sugar of 267, otherwise glucose control adequate over past day despite steroid therapy.   - Hb A1c 7.6%.  - SSI  -Will restart glipizide on discharge, better control can be assessed as an outpatient   # Depression  - con't celexa   # Ppx  - lovenox       LOS: 2 days   Yaakov Guthrie, BRAD 02/23/2012, 8:14 AM

## 2012-03-01 ENCOUNTER — Telehealth: Payer: Self-pay | Admitting: Dietician

## 2012-03-01 ENCOUNTER — Encounter: Payer: Medicare Other | Admitting: Internal Medicine

## 2012-03-01 NOTE — Telephone Encounter (Signed)
Hospital follow up call: patient requested to reschedule her appointment today. Says she is having coughing spells that wear her out. Encouraged her to come to appointment today to be sure she is recovering and she insisted on rescheduling. Transferred call to front office.

## 2012-03-04 NOTE — Discharge Summary (Signed)
Internal Medicine Teaching [name] Discharge Note  Name: [name] MRN: [id number] DOB: 09-22-1951 61 y.o.  Date of Admission: 02/21/2012 12:36 PM Date of Discharge: 02/23/2012 Attending Physician: Blanch Media  Discharge Diagnosis: Active Problems: COPD Exacerbation  DIABETES MELLITUS, TYPE II  Essential hypertension, benign Hyperlipidemia Depression and Anxiety     Discharge Medications: Medication List  As of 03/04/2012 12:15 PM   STOP taking these medications         Fluticasone-Salmeterol 250-50 MCG/DOSE Aepb      HYDROcodone-homatropine 5-1.5 MG/5ML syrup      sulfamethoxazole-trimethoprim 800-160 MG per tablet         TAKE these medications         albuterol (2.5 MG/3ML) 0.083% nebulizer solution   Commonly known as: PROVENTIL   Take 3 mLs (2.5 mg total) by nebulization every 4 (four) hours as needed. For shortness of breath      albuterol 108 (90 BASE) MCG/ACT inhaler   Commonly known as: PROVENTIL HFA;VENTOLIN HFA   Inhale 2 puffs into the lungs every 4 (four) hours as needed for wheezing.      carvedilol 6.25 MG tablet   Commonly known as: COREG   Take 6.25 mg by mouth 2 (two) times daily with a meal.      chlorpheniramine-HYDROcodone 10-8 MG/5ML Lqcr   Commonly known as: TUSSIONEX   Take 5 mLs by mouth every 12 (twelve) hours as needed (for cough).      citalopram 40 MG tablet   Commonly known as: CELEXA   Take 1 tablet (40 mg total) by mouth daily.      doxycycline 100 MG tablet   Commonly known as: VIBRA-TABS   Take 1 tablet (100 mg total) by mouth every 12 (twelve) hours.      fluticasone 50 MCG/ACT nasal spray   Commonly known as: FLONASE   Place 2 sprays into the nose daily.      fluticasone-salmeterol 115-21 MCG/ACT inhaler   Commonly known as: ADVAIR HFA   Inhale 2 puffs into the lungs 2 (two) times daily.      glipiZIDE 5 MG 24 hr tablet   Commonly known as: GLUCOTROL XL   Take 5 mg by mouth daily.     hydrocortisone cream 0.5 %   Apply topically 2 (two) times daily.      lisinopril-hydrochlorothiazide 20-25 MG per tablet   Commonly known as: PRINZIDE,ZESTORETIC   Take 1 tablet by mouth daily.      loratadine 10 MG tablet   Commonly known as: CLARITIN   Take 1 tablet (10 mg total) by mouth daily.      omeprazole 20 MG capsule   Commonly known as: PRILOSEC   Take 1 capsule (20 mg total) by mouth daily.      oxyCODONE-acetaminophen 5-325 MG per tablet   Commonly known as: PERCOCET   Take 2 tablets by mouth every 4 (four) hours as needed. For pain      pravastatin 40 MG tablet   Commonly known as: PRAVACHOL   Take 1 tablet (40 mg total) by mouth daily.      predniSONE 10 MG tablet   Commonly known as: DELTASONE   Take 4 pills by mouth daily for 3 days, then 3 pills daily for 3 days, then 2 pills daily for 3 days, then 1 pill daily for 3 days, thenstop      tiotropium 18 MCG inhalation capsule   Commonly known as: SPIRIVA   Place 18  mcg into inhaler and inhale daily.            Disposition and follow-up:   [name] was discharged from [facility] in Stable condition.  At [facility] follow-up, please assess her compliance to her COPD medications, her antibiotic course (6 days worth after discharge), and her prednisone taper (12 days worth after discharge).  See if her cough has resolved.    Follow-up Appointments: Follow-up Information    Follow up with [facility], MD on 03/01/2012. (2:15pm)    Contact information:   [address] [place] [address] [phone]         Discharge Orders    Future Appointments: Provider: Department: Dept Phone: Center:   03/07/2012 11:15 AM Elyse Jarvis, MD [facility] [phone] [facility]     Future Orders Please Complete By Expires   Diet Carb Modified      Activity as tolerated - No restrictions      Call MD for:  difficulty breathing, headache or visual disturbances      Call MD  for:  persistant dizziness or light-headedness         Consultations:    Procedures Performed:  X-ray Chest Pa And Lateral   02/21/2012  *RADIOLOGY REPORT*  Clinical Data: Shortness of breath and chest pain  CHEST - 2 VIEW  Comparison: 02/18/2012  Findings: Heart size and mediastinal contours are normal.  No pleural effusion or pulmonary edema.  There are surgical clips noted within the right axilla.  The lungs appear hyperinflated but are clear.  IMPRESSION:  1.  No acute cardiopulmonary abnormalities.  Original Report Authenticated By: Rosealee Albee, M.D.   Dg Chest 2 View  02/15/2012  *RADIOLOGY REPORT*  Clinical Data: Cough, congestion  CHEST - 2 VIEW  Comparison: 10/05/2011  Findings: Cardiomediastinal silhouette is stable.  Mild hyperinflation again noted.  No acute infiltrate or pulmonary edema.  Surgical clips right axilla again noted.  Metallic fixation plate cervical spine. Stable mild degenerative changes thoracic spine.  IMPRESSION: No active disease.  No significant change.  Original Report Authenticated By: Natasha Mead, M.D.   Dg Chest Port 1 View  02/18/2012  *RADIOLOGY REPORT*  Clinical Data: Shortness of breath with cough and congestion.  PORTABLE CHEST - 1 VIEW  Comparison: 02/15/2012  Findings: Cardiomegaly persists.  Right axillary node dissection. Mild vascular congestion.  No overt failure or infiltrates. No visible nodules.  No effusion or pneumothorax. Slight leftward mid thoracic scoliosis. Prior cervical fusion.  Little change from priors.  IMPRESSION: Stable chest.  Original Report Authenticated By: [name], M.D.    Admission HPI:  Pt is a 61 yo F with a h/o COPD and tobacco abuse who presents for SOB. This has been coughing with increased sputum production for the past 2 weeks, worsening over the past 9 days. She went to the urgent care center on 4/6 (DuoNeb and prednisone), the ED on 4/9 (given, prednisone and cough suppressant) and the ED again on 4/12  (continued antitussin prednisone, Advair antibiotic). She's been on Bactrim and prednisone. The patient says she has not been taking her Spiriva or Advair since they make her cough, and she has not had albuterol in over a year. Presently, she has green sputum production, shortness of breath, pain in her ribs with coughing, rhinorrhea, and some watery eyes. She denies fevers but endorses night sweats. She denies chest pain, leg swelling, or hemoptysis. She does continue to smoke, but typically  only takes a few puffs at a time before stamping out a cigarette.   Hospital Course by problem list:  COPD Exacerbation:  Cough with increased sputum for 2 weeks prior to admission.  Urgent care visit and two ED visits over that time with nebulizers, abx, steroids given.  On admission, she had green sputum production, shortness of breath, pain in her ribs with coughing, rhinorrhea, and some watery eyes. Consistent with COPD exacerbation in setting of likely viral URI.  Patient was treated with nebulizers, cough suppressants, steroids, and abx in hospital.  Her cough improved moderately during hospitalization, she was satting well with no SOB.  She was discharged with doxycycline and prednisone taper.  Advair was changed to MDI on discharge as the powder in the diskus irritated her cough.  She was also prescribed Tussionex for cough on discharge.       Discharge Vitals:  BP 120/70  Pulse 84  Temp(Src) 97.2 F (36.2 C) (Oral)  Resp 18  SpO2 98%  Discharge Labs:  Admission on 02/21/2012, Discharged on 02/23/2012  Component Date Value Range Status  . Hemoglobin A1C (%) 02/21/2012 7.6* <5.7 Final   Comment: (NOTE)                                                                                                                         According to the ADA Clinical Practice Recommendations for 2011, when                          HbA1c is used as a screening test:                           >=6.5%   Diagnostic of  Diabetes Mellitus                                    (if abnormal result is confirmed)                          5.7-6.4%   Increased risk of developing Diabetes Mellitus                          References:Diagnosis and Classification of Diabetes Mellitus,Diabetes                          Care,2011,34(Suppl 1):S62-S69 and Standards of Medical Care in                                  Diabetes - 2011,Diabetes Care,2011,34 (Suppl 1):S11-S61.  . Mean Plasma Glucose (mg/dL) 16/08/9603 540* <981 Final  . Sodium (mEq/L) 02/21/2012 133* 135-145 Final  . Potassium (mEq/L) 02/21/2012 4.1  3.5-5.1  Final  . Chloride (mEq/L) 02/21/2012 98  96-112 Final  . CO2 (mEq/L) 02/21/2012 24  19-32 Final  . Glucose, Bld (mg/dL) 16/08/9603 540* 98-11 Final  . BUN (mg/dL) 91/47/8295 12  6-21 Final  . Creatinine, Ser (mg/dL) 30/86/5784 6.96  2.95-2.84 Final  . Calcium (mg/dL) 13/24/4010 8.9  2.7-25.3 Final  . Total Protein (g/dL) 66/44/0347 7.0  4.2-5.9 Final  . Albumin (g/dL) 56/38/7564 3.4* 3.3-2.9 Final  . AST (U/L) 02/21/2012 12  0-37 Final  . ALT (U/L) 02/21/2012 8  0-35 Final  . Alkaline Phosphatase (U/L) 02/21/2012 90  39-117 Final  . Total Bilirubin (mg/dL) 51/88/4166 0.3  0.6-3.0 Final  . GFR calc non Af Amer (mL/min) 02/21/2012 89* >90 Final  . GFR calc Af Amer (mL/min) 02/21/2012 >90  >90 Final   Comment:                                 The eGFR has been calculated                          using the CKD EPI equation.                          This calculation has not been                          validated in all clinical                          situations.                          eGFR's persistently                          <90 mL/min signify                          possible Chronic Kidney Disease.  . WBC (K/uL) 02/21/2012 10.0  4.0-10.5 Final  . RBC (MIL/uL) 02/21/2012 4.13  3.87-5.11 Final  . Hemoglobin (g/dL) 16/11/930 35.5  73.2-20.2 Final  . HCT (%) 02/21/2012 36.5  36.0-46.0 Final  .  MCV (fL) 02/21/2012 88.4  78.0-100.0 Final  . MCH (pg) 02/21/2012 29.3  26.0-34.0 Final  . MCHC (g/dL) 54/27/0623 76.2  83.1-51.7 Final  . RDW (%) 02/21/2012 13.2  11.5-15.5 Final  . Platelets (K/uL) 02/21/2012 328  150-400 Final  . Neutrophils Relative (%) 02/21/2012 75  43-77 Final  . Neutro Abs (K/uL) 02/21/2012 7.5  1.7-7.7 Final  . Lymphocytes Relative (%) 02/21/2012 21  12-46 Final  . Lymphs Abs (K/uL) 02/21/2012 2.1  0.7-4.0 Final  . Monocytes Relative (%) 02/21/2012 4  3-12 Final  . Monocytes Absolute (K/uL) 02/21/2012 0.4  0.1-1.0 Final  . Eosinophils Relative (%) 02/21/2012 0  0-5 Final  . Eosinophils Absolute (K/uL) 02/21/2012 0.0  0.0-0.7 Final  . Basophils Relative (%) 02/21/2012 0  0-1 Final  . Basophils Absolute (K/uL) 02/21/2012 0.0  0.0-0.1 Final  . MRSA by PCR  02/22/2012 NEGATIVE  NEGATIVE Final   Comment:  The GeneXpert MRSA Assay (FDA                          approved for NASAL specimens                          only), is one component of a                          comprehensive MRSA colonization                          surveillance program. It is not                          intended to diagnose MRSA                          infection nor to guide or                          monitor treatment for                          MRSA infections.  . Glucose-Capillary (mg/dL) 16/08/9603 540* 98-11 Final  . Comment 1  02/21/2012 Documented in Chart   Final  . Comment 2  02/21/2012 Notify RN   Final  . Sodium (mEq/L) 02/22/2012 136  135-145 Final  . Potassium (mEq/L) 02/22/2012 3.5  3.5-5.1 Final  . Chloride (mEq/L) 02/22/2012 99  96-112 Final  . CO2 (mEq/L) 02/22/2012 24  19-32 Final  . Glucose, Bld (mg/dL) 91/47/8295 621* 30-86 Final  . BUN (mg/dL) 57/84/6962 13  9-52 Final  . Creatinine, Ser (mg/dL) 84/13/2440 1.02  7.25-3.66 Final  . Calcium (mg/dL) 44/01/4741 8.9  5.9-56.3 Final  . GFR calc non Af Amer (mL/min) 02/22/2012 >90  >90  Final  . GFR calc Af Amer (mL/min) 02/22/2012 >90  >90 Final   Comment:                                 The eGFR has been calculated                          using the CKD EPI equation.                          This calculation has not been                          validated in all clinical                          situations.                          eGFR's persistently                          <90 mL/min signify  possible Chronic Kidney Disease.  . Glucose-Capillary (mg/dL) 40/98/1191 478* 29-56 Final  . Glucose-Capillary (mg/dL) 21/30/8657 846* 96-29 Final  . Glucose-Capillary (mg/dL) 52/84/1324 401* 02-72 Final  . Comment 1  02/22/2012 Notify RN   Final  . Comment 2  02/22/2012 Documented in Chart   Final  . Glucose-Capillary (mg/dL) 53/66/4403 474* 25-95 Final  . Sodium (mEq/L) 02/23/2012 137  135-145 Final  . Potassium (mEq/L) 02/23/2012 3.9  3.5-5.1 Final  . Chloride (mEq/L) 02/23/2012 101  96-112 Final  . CO2 (mEq/L) 02/23/2012 23  19-32 Final  . Glucose, Bld (mg/dL) 63/87/5643 329* 51-88 Final  . BUN (mg/dL) 41/66/0630 12  1-60 Final  . Creatinine, Ser (mg/dL) 10/93/2355 7.32  2.02-5.42 Final  . Calcium (mg/dL) 70/62/3762 9.3  8.3-15.1 Final  . GFR calc non Af Amer (mL/min) 02/23/2012 >90  >90 Final  . GFR calc Af Amer (mL/min) 02/23/2012 >90  >90 Final   Comment:                                 The eGFR has been calculated                          using the CKD EPI equation.                          This calculation has not been                          validated in all clinical                          situations.                          eGFR's persistently                          <90 mL/min signify                          possible Chronic Kidney Disease.  . Glucose-Capillary (mg/dL) 76/16/0737 106* 26-94 Final  . Glucose-Capillary (mg/dL) 85/46/2703 500* 93-81 Final  . Glucose-Capillary (mg/dL) 82/99/3716 967* 89-38 Final  . Comment 1   02/23/2012 Notify RN   Final  . Comment 2  02/23/2012 Documented in Chart   Final  . Glucose-Capillary (mg/dL) 08/24/5101 585* 27-78 Final  . Comment 1  02/23/2012 Notify RN   Final  . Comment 2  02/23/2012 Documented in Chart   Final  ] Signed: Blanca Friend 03/04/2012, 12:15 PM   Time Spent on Discharge: 25 minutes

## 2012-03-07 ENCOUNTER — Encounter: Payer: Self-pay | Admitting: Internal Medicine

## 2012-03-07 ENCOUNTER — Ambulatory Visit (INDEPENDENT_AMBULATORY_CARE_PROVIDER_SITE_OTHER): Payer: Medicare Other | Admitting: Internal Medicine

## 2012-03-07 VITALS — BP 125/76 | HR 78 | Temp 98.2°F | Resp 20 | Ht 66.5 in | Wt 227.8 lb

## 2012-03-07 DIAGNOSIS — E119 Type 2 diabetes mellitus without complications: Secondary | ICD-10-CM

## 2012-03-07 DIAGNOSIS — I10 Essential (primary) hypertension: Secondary | ICD-10-CM | POA: Diagnosis not present

## 2012-03-07 DIAGNOSIS — J449 Chronic obstructive pulmonary disease, unspecified: Secondary | ICD-10-CM | POA: Diagnosis not present

## 2012-03-07 LAB — LIPID PANEL
HDL: 36 mg/dL — ABNORMAL LOW (ref >39)
LDL Cholesterol: 114 mg/dL — ABNORMAL HIGH (ref 0–99)
Triglycerides: 129 mg/dL (ref <150)
VLDL: 26 mg/dL (ref 0–40)

## 2012-03-07 MED ORDER — PREDNISONE 10 MG PO TABS: ORAL_TABLET | ORAL | Status: DC

## 2012-03-07 MED ORDER — BUDESONIDE-FORMOTEROL FUMARATE 80-4.5 MCG/ACT IN AERO: 2.0000 | INHALATION_SPRAY | Freq: Two times a day (BID) | RESPIRATORY_TRACT | Status: DC

## 2012-03-07 MED ORDER — METFORMIN HCL 500 MG PO TABS: 500.0000 mg | ORAL_TABLET | Freq: Two times a day (BID) | ORAL | Status: DC

## 2012-03-07 NOTE — Patient Instructions (Signed)
Please schedule a follow up appointment in  2 months or sooner if needed . Please bring your medication bottles with your next appointment. Please take your medicines as prescribed. I will call you with your lab results if anything will be abnormal. 

## 2012-03-07 NOTE — Progress Notes (Signed)
  Subjective:    Patient ID: [name], female    DOB: 1951/09/26, 61 y.o.   MRN: [id number]  HPI: 61 year old woman with past medical history significant for COPD, Type 2 DM, HTN comes to the clinic today for hospital followup.  Patient was recently hospitalized through 02/21/2012 to 02/23/2012 for COPD exacerbation. Still reports having some cough that gets worse with laying down. "If by luck I am able to get some sleep , then would immediately start coughing on waking up". She states that she is not bringing the yellowish - greenish stuff as she was but is still getting some whitish mucus. Also reports some post nasal drip. Her SOB has improved. She has using her albuterol every 4 hours . States that she still chokes from Advair, although it was changed from diskus to inhaler at the time of hospital discharge. Has not been taking spiriva daily. She still reports soreness in the ribs - describes as pretty much constant pain that gets worse with coughing.  There was some confusion and she states that just ended up just getting 6 pills- instead of full steroid taper dose at the time of discharge and she just took 1 pill a day for 6 days. She finished her course of antibiotic.    Review of Systems  Constitutional: Negative for fever and fatigue.  HENT: Negative for rhinorrhea, sneezing and postnasal drip.   Eyes: Negative for visual disturbance.  Respiratory: Positive for cough and shortness of breath. Negative for apnea and stridor.   Cardiovascular: Negative for chest pain, palpitations and leg swelling.  Gastrointestinal: Negative for nausea, abdominal pain, diarrhea and constipation.  Genitourinary: Negative for dysuria, urgency, frequency and hematuria.  Musculoskeletal: Negative for arthralgias.  Neurological: Negative for dizziness, facial asymmetry, numbness and headaches.  Hematological: Negative for adenopathy.       Objective:   Physical Exam  Constitutional: She is  oriented to person, place, and time. She appears well-developed and well-nourished. No distress.  HENT:  Head: Normocephalic and atraumatic.  Mouth/Throat: No oropharyngeal exudate.  Eyes: Conjunctivae and EOM are normal. Pupils are equal, round, and reactive to light. Right eye exhibits no discharge. Left eye exhibits no discharge. No scleral icterus.  Neck: Normal range of motion. Neck supple. No JVD present. No tracheal deviation present. No thyromegaly present.  Cardiovascular: Normal rate and regular rhythm.  Exam reveals no gallop and no friction rub.   No murmur heard. Pulmonary/Chest: Effort normal and breath sounds normal. No stridor. No respiratory distress.       Poor airflow bilaterally with mild diffuse expiratory wheezes present  Abdominal: Soft. Bowel sounds are normal. She exhibits no distension. There is no tenderness. There is no rebound.  Musculoskeletal: Normal range of motion. She exhibits no edema and no tenderness.  Lymphadenopathy:    She has no cervical adenopathy.  Neurological: She is alert and oriented to person, place, and time. She has normal reflexes. No cranial nerve deficit. Coordination normal.  Skin: Skin is warm. She is not diaphoretic.          Assessment & Plan:

## 2012-03-11 NOTE — Assessment & Plan Note (Signed)
Patient continues to complain of some productive cough and shortness of breath. On exam she had poor airflow bilaterally with some diffuse expiratory wheezes. Because of some confusion patient was not able to complete her prednisone taper as directed.  - Would give her a short course of prednisone taper. - Would discontinue her Advair because of intolerance. - Start her on Symbicort. - Advised her to take her Spiriva daily - Continue Albuterol PRN.

## 2012-03-11 NOTE — Assessment & Plan Note (Signed)
Lab Results  Component Value Date   HGBA1C 7.6* 02/21/2012   CREATININE 0.64 02/23/2012   CHOL 176 03/07/2012   HDL 36* 03/07/2012   TRIG 129 03/07/2012    Last eye exam and foot exam: No results found for this basename: HMDIABEYEEXA, HMDIABFOOTEX    Assessment: Diabetes control: controlled Progress toward goals: unchanged Barriers to meeting goals: no barriers identified  Plan: Diabetes treatment: Her AIC has been running above 7 for last 2 visits - Would change her from Glipizide to Meformin for better control . Refer to: none Instruction/counseling given: reminded to get eye exam, reminded to bring blood glucose meter & log to each visit, reminded to bring medications to each visit, discussed foot care, discussed the need for weight loss and discussed diet

## 2012-03-11 NOTE — Assessment & Plan Note (Signed)
Lab Results  Component Value Date   NA 137 02/23/2012   K 3.9 02/23/2012   CL 101 02/23/2012   CO2 23 02/23/2012   BUN 12 02/23/2012   CREATININE 0.64 02/23/2012    BP Readings from Last 3 Encounters:  03/07/12 125/76  02/23/12 120/70  02/21/12 151/80    Assessment: Hypertension control:  controlled  Progress toward goals:  at goal Barriers to meeting goals:  no barriers identified  Plan: Hypertension treatment:  continue current medications

## 2012-03-15 ENCOUNTER — Other Ambulatory Visit: Payer: Self-pay | Admitting: *Deleted

## 2012-03-15 NOTE — Telephone Encounter (Signed)
Needs 6 month refill request.

## 2012-04-02 ENCOUNTER — Emergency Department (INDEPENDENT_AMBULATORY_CARE_PROVIDER_SITE_OTHER)
Admission: EM | Admit: 2012-04-02 | Discharge: 2012-04-02 | Disposition: A | Discharge status: Home / Self Care | Payer: Medicare Other | Source: Home / Self Care | Attending: Emergency Medicine | Admitting: Emergency Medicine

## 2012-04-02 ENCOUNTER — Encounter (HOSPITAL_COMMUNITY): Payer: Self-pay

## 2012-04-02 DIAGNOSIS — L259 Unspecified contact dermatitis, unspecified cause: Secondary | ICD-10-CM | POA: Diagnosis not present

## 2012-04-02 MED ORDER — TRIAMCINOLONE ACETONIDE 0.1 % EX CREA: TOPICAL_CREAM | Freq: Two times a day (BID) | CUTANEOUS | Status: DC

## 2012-04-02 MED ORDER — DIPHENHYDRAMINE HCL 25 MG PO CAPS
50.0000 mg | ORAL_CAPSULE | Freq: Once | ORAL | Status: AC
  Administered 2012-04-02: 50 mg via ORAL

## 2012-04-02 MED ORDER — DIPHENHYDRAMINE HCL 25 MG PO CAPS
ORAL_CAPSULE | ORAL | Status: AC
  Filled 2012-04-02: qty 2

## 2012-04-02 MED ORDER — METHYLPREDNISOLONE ACETATE 80 MG/ML IJ SUSP
INTRAMUSCULAR | Status: AC
  Filled 2012-04-02: qty 1

## 2012-04-02 MED ORDER — METHYLPREDNISOLONE ACETATE 40 MG/ML IJ SUSP
80.0000 mg | Freq: Once | INTRAMUSCULAR | Status: AC
  Administered 2012-04-02: 80 mg via INTRAMUSCULAR

## 2012-04-02 MED ORDER — LORATADINE 10 MG PO TABS: 10.0000 mg | ORAL_TABLET | Freq: Every day | ORAL | Status: DC

## 2012-04-02 NOTE — ED Notes (Signed)
Pt was working in garden one week ago and now has itchy rash on hands and lower arms, she also has sob and states she has asthma.  Pt is tearful and was unable to sleep last pm.

## 2012-04-02 NOTE — Discharge Instructions (Signed)
Try using Cetaphil instead of regular soap, and Eucerin or Aquaphor cream which will minimize irritation and help soothe the skin.  Apply directly wet skin after bathing or washing. Make sure you apply these at least twice a day to help moisturize and protect your skin. Put the topical steroid on place a day and then use Eucerin or Aquaphor. Minimize contact with hot water, harsh, irritating soaps, chemicals, other irritants. Return if you have a fever above 100.4, if you get worse, or any other concerns.

## 2012-04-02 NOTE — ED Provider Notes (Signed)
History     CSN: [id number]  Arrival date & time 04/02/12  0908   First MD Initiated Contact with Patient 04/02/12 [phone]      Chief Complaint  Patient presents with  . Rash    (Consider location/radiation/quality/duration/timing/severity/associated sxs/prior treatment) HPI Comments: Patient reports a  papular, itchy rash on the backs of her hands after planning a tomato and pepper garden with her bare hands. States it is itchy all day, and is now starting to spread up her forearms. States it is worse with activities such as washing dishes, but she also doesn't with her bare hands. No alleviating factors. No redness, purulent drainage, blisters, fevers, nausea, vomiting. No known exposure to poison ivy. No other rash anywhere else. No new lotions, soaps, detergents. She is afraid that she is going to spread it to her husband and other close contacts and is quite anxious about this. She has a history of asthma/COPD, allergies. She takes Claritin, Flonase intermittently. She has a prescription of hydrocortisone cream, but has not been using this. States she is using the symbicort and spriva as prescribed.  ROS as noted in HPI. All other ROS negative.   Patient is a 61 y.o. female presenting with rash. The history is provided by the patient. No language interpreter was used.  Rash  This is a new problem. The current episode started more than 2 days ago. The problem has been gradually worsening. The problem is associated with plant contact. There has been no fever. The rash is present on the left hand and right hand. Associated symptoms include itching and pain. Pertinent negatives include no blisters and no weeping. She has tried nothing for the symptoms. The treatment provided no relief. Risk factors include new environmental exposures.    Past Medical History  Diagnosis Date  . Hyperlipidemia   . Hypertension   . Tobacco abuse   . COPD (chronic obstructive pulmonary disease)     History  of multiple hospital admissions for exercabation   . Asthma   . Breast cancer 1991    s/p lumpectomy, chemotherapy and radiation therapy in 1991. Mammogram in 2007 was normal.  . Sigmoid diverticulitis 80/2008  . Anxiety   . Depression   . Obesity   . GERD (gastroesophageal reflux disease)   . Heart murmur 10/05/11    "first time I ever heard I had one was today"  . Pneumonia   . Shortness of breath 10/05/11    "at rest; lying down; w/exertion"  . Diabetes mellitus   . Bronchitis     h/o  . Diarrhea     h/o  . Constipated     h/o    Past Surgical History  Procedure Date  . Dobutamine stress echo 08/2004    Inferior ischemia, normal LV systolic function, no significant CAD  . Abdominal hysterectomy   . Breast surgery 1991    lumphectomy right breast  . Neck surgery 2012    "Dr. [name]  put plate in; did something to my vertebrae"    Family History  Problem Relation Age of Onset  . Cancer Mother     History  Substance Use Topics  . Smoking status: Current Some Day Smoker -- 0.3 packs/day for 40 years    Types: Cigarettes  . Smokeless tobacco: Never Used   Comment: given QUIT line info - husband states he will quit when she does  . Alcohol Use: 2.4 oz/week    4 Cans of beer per week     "  only on the weekends"    OB History    Grav Para Term Preterm Abortions TAB SAB Ect Mult Living                  Review of Systems  Constitutional: Negative for fever.  Gastrointestinal: Negative for nausea and vomiting.  Musculoskeletal: Negative for arthralgias.  Skin: Positive for itching and rash.    Allergies  Review of patient's allergies indicates no known allergies.  Home Medications   Current Outpatient Rx  Name Route Sig Dispense Refill  . ALBUTEROL SULFATE HFA 108 (90 BASE) MCG/ACT IN AERS Inhalation Inhale 2 puffs into the lungs every 4 (four) hours as needed for wheezing. 1 Inhaler 11  . ALBUTEROL SULFATE (2.5 MG/3ML) 0.083% IN NEBU Nebulization Take  3 mLs (2.5 mg total) by nebulization every 4 (four) hours as needed. For shortness of breath 75 mL 3  . BUDESONIDE-FORMOTEROL FUMARATE 80-4.5 MCG/ACT IN AERO Inhalation Inhale 2 puffs into the lungs 2 (two) times daily. 1 Inhaler 1  . CARVEDILOL 6.25 MG PO TABS Oral Take 6.25 mg by mouth 2 (two) times daily with a meal.    . HYDROCOD POLST-CPM POLST ER 10-8 MG/5ML PO LQCR Oral Take 5 mLs by mouth every 12 (twelve) hours as needed (for cough). 480 mL 0  . CITALOPRAM HYDROBROMIDE 40 MG PO TABS Oral Take 1 tablet (40 mg total) by mouth daily. 30 tablet 3  . FLUTICASONE PROPIONATE 50 MCG/ACT NA SUSP Nasal Place 2 sprays into the nose daily. 16 g 3  . LISINOPRIL-HYDROCHLOROTHIAZIDE 20-25 MG PO TABS Oral Take 1 tablet by mouth daily.    Marland Kitchen LORATADINE 10 MG PO TABS Oral Take 1 tablet (10 mg total) by mouth daily. 30 tablet 3  . LORATADINE 10 MG PO TABS Oral Take 1 tablet (10 mg total) by mouth daily. 14 tablet 0  . METFORMIN HCL 500 MG PO TABS Oral Take 1 tablet (500 mg total) by mouth 2 (two) times daily with a meal. 60 tablet 3  . OMEPRAZOLE 20 MG PO CPDR Oral Take 1 capsule (20 mg total) by mouth daily. 30 capsule 1  . PRAVASTATIN SODIUM 40 MG PO TABS Oral Take 1 tablet (40 mg total) by mouth daily. 30 tablet 5  . PREDNISONE 10 MG PO TABS  Take 4 pills by mouth daily for 3 days, then 3 pills daily for 3 days, then 2 pills daily for 3 days, then 1 pill daily for 3 days, thenstop 30 tablet 0  . TIOTROPIUM BROMIDE MONOHYDRATE 18 MCG IN CAPS Inhalation Place 18 mcg into inhaler and inhale daily.    . TRIAMCINOLONE ACETONIDE 0.1 % EX CREA Topical Apply topically 2 (two) times daily. Apply for 2 weeks. May use on face 30 g 0    BP 159/93  Pulse 111  Temp(Src) 99 F (37.2 C) (Oral)  Resp 20  SpO2 100%  Physical Exam  Nursing note and vitals reviewed. Constitutional: She is oriented to person, place, and time. She appears well-developed and well-nourished. She appears distressed.        appears  uncomfortable. Continually scratching the backs of her hands during interview.  HENT:  Head: Normocephalic and atraumatic.  Eyes: Conjunctivae and EOM are normal.  Neck: Normal range of motion.  Cardiovascular: Normal rate.   Pulmonary/Chest: Effort normal.  Abdominal: She exhibits no distension.  Musculoskeletal: Normal range of motion.  Neurological: She is alert and oriented to person, place, and time.  Skin: Skin is  warm and dry. Rash noted.       Papular rash, red, irritated skin consistent contact in tightness over the backs of both hands, lower forearms. No signs of infection. No other rash anywhere else.  Psychiatric: Her behavior is normal. Judgment and thought content normal. Her mood appears anxious.       tearful    ED Course  Procedures (including critical care time)  Labs Reviewed - No data to display No results found.   1. Contact dermatitis     MDM  Previous records reviewed. Admitted 4/12-15/13 for COPD flare. History of COPD, depression/anxiety, hypertension, diabetes. [facility]'d, was started on symbicort, advise continue Spriva, continue albuterol when necessary.  Reassured patient that this is not contagious, most consistent with contact dermatitis. No signs of infection at this time, but advised her that she keeps scratching this, it will become infected. Advised patient that she will need to wear gloves when gardening, washing dishes. She appears significantly uncomfortable here, and giving Solu-Medrol and Benadryl 50 mg. Will send her home with topical steroids and have her start emollients. She will followup with her primary care physician as needed.  Luiz Blare, MD 04/02/12 1001

## 2012-04-03 NOTE — ED Notes (Signed)
Pt called and said she was not given/can not find her prescriptions.  Rx for kenalog cream and claritin called to [facility] on [address].

## 2012-04-11 ENCOUNTER — Other Ambulatory Visit: Payer: Self-pay | Admitting: Internal Medicine

## 2012-04-11 DIAGNOSIS — E785 Hyperlipidemia, unspecified: Secondary | ICD-10-CM

## 2012-04-11 DIAGNOSIS — J449 Chronic obstructive pulmonary disease, unspecified: Secondary | ICD-10-CM

## 2012-04-21 ENCOUNTER — Encounter (HOSPITAL_COMMUNITY): Payer: Self-pay | Admitting: Emergency Medicine

## 2012-04-21 ENCOUNTER — Emergency Department (INDEPENDENT_AMBULATORY_CARE_PROVIDER_SITE_OTHER): Payer: Medicare Other

## 2012-04-21 ENCOUNTER — Emergency Department (INDEPENDENT_AMBULATORY_CARE_PROVIDER_SITE_OTHER)
Admission: EM | Admit: 2012-04-21 | Discharge: 2012-04-21 | Disposition: A | Discharge status: Home / Self Care | Payer: Medicare Other | Source: Home / Self Care | Attending: Family Medicine | Admitting: Family Medicine

## 2012-04-21 DIAGNOSIS — M25569 Pain in unspecified knee: Secondary | ICD-10-CM | POA: Diagnosis not present

## 2012-04-21 DIAGNOSIS — S83289A Other tear of lateral meniscus, current injury, unspecified knee, initial encounter: Secondary | ICD-10-CM | POA: Diagnosis not present

## 2012-04-21 DIAGNOSIS — M1711 Unilateral primary osteoarthritis, right knee: Secondary | ICD-10-CM

## 2012-04-21 DIAGNOSIS — S83281A Other tear of lateral meniscus, current injury, right knee, initial encounter: Secondary | ICD-10-CM

## 2012-04-21 DIAGNOSIS — M1710 Unilateral primary osteoarthritis, unspecified knee: Secondary | ICD-10-CM

## 2012-04-21 MED ORDER — HYDROCODONE-ACETAMINOPHEN 5-500 MG PO TABS: 1.0000 | ORAL_TABLET | Freq: Four times a day (QID) | ORAL | Status: AC | PRN

## 2012-04-21 NOTE — ED Provider Notes (Signed)
History     CSN: [id number]  Arrival date & time 04/21/12  1103   First MD Initiated Contact with Patient 04/21/12 1112      Chief Complaint  Patient presents with  . Leg Pain    (Consider location/radiation/quality/duration/timing/severity/associated sxs/prior treatment) Patient is a 61 y.o. female presenting with knee pain. The history is provided by the patient.  Knee Pain This is a new problem. The current episode started more than 1 week ago (felt pop in knee, unable to sraighten, has had pain and swelling since.). The problem occurs constantly. The problem has been gradually worsening. Pertinent negatives include no chest pain and no abdominal pain. The symptoms are aggravated by bending and walking. Nothing relieves the symptoms.    Past Medical History  Diagnosis Date  . Hyperlipidemia   . Hypertension   . Tobacco abuse   . COPD (chronic obstructive pulmonary disease)     History of multiple hospital admissions for exercabation   . Asthma   . Breast cancer 1991    s/p lumpectomy, chemotherapy and radiation therapy in 1991. Mammogram in 2007 was normal.  . Sigmoid diverticulitis 80/2008  . Anxiety   . Depression   . Obesity   . GERD (gastroesophageal reflux disease)   . Heart murmur 10/05/11    "first time I ever heard I had one was today"  . Pneumonia   . Shortness of breath 10/05/11    "at rest; lying down; w/exertion"  . Diabetes mellitus   . Bronchitis     h/o  . Diarrhea     h/o  . Constipated     h/o    Past Surgical History  Procedure Date  . Dobutamine stress echo 08/2004    Inferior ischemia, normal LV systolic function, no significant CAD  . Abdominal hysterectomy   . Breast surgery 1991    lumphectomy right breast  . Neck surgery 2012    "Dr. [name]  put plate in; did something to my vertebrae"    Family History  Problem Relation Age of Onset  . Cancer Mother     History  Substance Use Topics  . Smoking status: Current Some Day  Smoker -- 0.3 packs/day for 40 years    Types: Cigarettes  . Smokeless tobacco: Never Used   Comment: given QUIT line info - husband states he will quit when she does  . Alcohol Use: 2.4 oz/week    4 Cans of beer per week     "only on the weekends"    OB History    Grav Para Term Preterm Abortions TAB SAB Ect Mult Living                  Review of Systems  Constitutional: Negative.   Cardiovascular: Negative for chest pain.  Gastrointestinal: Negative for abdominal pain.  Musculoskeletal: Positive for joint swelling and gait problem.    Allergies  Review of patient's allergies indicates no known allergies.  Home Medications   Current Outpatient Rx  Name Route Sig Dispense Refill  . ALBUTEROL SULFATE HFA 108 (90 BASE) MCG/ACT IN AERS Inhalation Inhale 2 puffs into the lungs every 4 (four) hours as needed for wheezing. 1 Inhaler 11  . ALBUTEROL SULFATE (2.5 MG/3ML) 0.083% IN NEBU Nebulization Take 3 mLs (2.5 mg total) by nebulization every 4 (four) hours as needed. For shortness of breath 75 mL 3  . CARVEDILOL 6.25 MG PO TABS Oral Take 6.25 mg by mouth 2 (two) times daily with  a meal.    . HYDROCOD POLST-CPM POLST ER 10-8 MG/5ML PO LQCR Oral Take 5 mLs by mouth every 12 (twelve) hours as needed (for cough). 480 mL 0  . CITALOPRAM HYDROBROMIDE 40 MG PO TABS Oral Take 1 tablet (40 mg total) by mouth daily. 30 tablet 3  . FLUTICASONE PROPIONATE 50 MCG/ACT NA SUSP Nasal Place 2 sprays into the nose daily. 16 g 3  . HYDROCODONE-ACETAMINOPHEN 5-500 MG PO TABS Oral Take 1 tablet by mouth every 6 (six) hours as needed for pain. 20 tablet 0  . LISINOPRIL-HYDROCHLOROTHIAZIDE 20-25 MG PO TABS Oral Take 1 tablet by mouth daily.    Marland Kitchen LORATADINE 10 MG PO TABS Oral Take 1 tablet (10 mg total) by mouth daily. 30 tablet 3  . LORATADINE 10 MG PO TABS Oral Take 1 tablet (10 mg total) by mouth daily. 14 tablet 0  . METFORMIN HCL 500 MG PO TABS Oral Take 1 tablet (500 mg total) by mouth 2 (two) times  daily with a meal. 60 tablet 3  . OMEPRAZOLE 20 MG PO CPDR Oral Take 1 capsule (20 mg total) by mouth daily. 30 capsule 1  . PRAVASTATIN SODIUM 40 MG PO TABS  TAKE 1 TABLET BY MOUTH DAILY 30 tablet PRN  . PREDNISONE 10 MG PO TABS  Take 4 pills by mouth daily for 3 days, then 3 pills daily for 3 days, then 2 pills daily for 3 days, then 1 pill daily for 3 days, thenstop 30 tablet 0  . SYMBICORT 80-4.5 MCG/ACT IN AERO  INHALE 2 PUFFS BY MOUTH TWICE DAILY 1 Inhaler 3  . TIOTROPIUM BROMIDE MONOHYDRATE 18 MCG IN CAPS Inhalation Place 18 mcg into inhaler and inhale daily.    . TRIAMCINOLONE ACETONIDE 0.1 % EX CREA Topical Apply topically 2 (two) times daily. Apply for 2 weeks. May use on face 30 g 0    BP 128/76  Pulse 73  Temp 99 F (37.2 C) (Oral)  Resp 17  SpO2 97%  Physical Exam  Nursing note and vitals reviewed. Constitutional: She is oriented to person, place, and time. She appears well-developed and well-nourished.  Musculoskeletal: She exhibits tenderness.       Right knee: She exhibits decreased range of motion and effusion. She exhibits no erythema, normal alignment and normal patellar mobility. tenderness found. Medial joint line and lateral joint line tenderness noted. No patellar tendon tenderness noted.  Neurological: She is alert and oriented to person, place, and time.  Skin: Skin is warm and dry.    ED Course  Procedures (including critical care time)  Labs Reviewed - No data to display Dg Knee Complete 4 Views Right  04/21/2012  *RADIOLOGY REPORT*  Clinical Data: Knee pain with limited extension since falling 3 weeks ago.  RIGHT KNEE - COMPLETE 4+ VIEW  Comparison: None.  Findings: Tricompartmental degenerative changes are most advanced medially where there is joint space loss and peripheral osteophyte formation.  The mineralization and alignment are normal.  There is no evidence of acute fracture, dislocation or significant knee joint effusion.  There is spurring at the  quadriceps insertion on the patella.  IMPRESSION: Tricompartmental degenerative changes.  No acute osseous findings.  Original Report Authenticated By: Gerrianne Scale, M.D.     1. Degenerative arthritis of right knee   2. Acute lateral meniscus tear of right knee       MDM  X-rays reviewed and report per radiologist.        Quita Skye  Artis Flock, MD 04/21/12 1215

## 2012-04-21 NOTE — ED Notes (Signed)
Pt here with right leg pain that started last week with just with walking.states she heard joint pop and now is having diff walking.no swelling seen.ibuprofen not working

## 2012-04-21 NOTE — Discharge Instructions (Signed)
Wear knee brace for comfort, activity as tolerated, see your orthopedist next week for further eval.

## 2012-04-23 NOTE — ED Provider Notes (Signed)
Medical screening examination/treatment/procedure(s) were performed by non-physician practitioner and as supervising physician I was immediately available for consultation/collaboration.  Cheri Guppy, MD 04/23/12 [phone]

## 2012-05-03 NOTE — Telephone Encounter (Signed)
Never heard any further info about this med.

## 2012-05-04 ENCOUNTER — Other Ambulatory Visit: Payer: Self-pay | Admitting: *Deleted

## 2012-05-05 ENCOUNTER — Telehealth: Payer: Self-pay | Admitting: Internal Medicine

## 2012-05-05 DIAGNOSIS — K219 Gastro-esophageal reflux disease without esophagitis: Secondary | ICD-10-CM

## 2012-05-05 MED ORDER — OMEPRAZOLE 20 MG PO CPDR: 20.0000 mg | DELAYED_RELEASE_CAPSULE | Freq: Every day | ORAL | Status: DC

## 2012-05-05 NOTE — Telephone Encounter (Signed)
I called the patient to ask if she takes omeprazole. Patient told  me that she takes omeprazole a necessary for GERD, not on a daily basis.  Thanks, [employer]

## 2012-05-08 ENCOUNTER — Other Ambulatory Visit: Payer: Self-pay | Admitting: *Deleted

## 2012-05-08 ENCOUNTER — Other Ambulatory Visit: Payer: Self-pay | Admitting: Internal Medicine

## 2012-05-08 DIAGNOSIS — J449 Chronic obstructive pulmonary disease, unspecified: Secondary | ICD-10-CM

## 2012-05-08 MED ORDER — FLUTICASONE PROPIONATE 50 MCG/ACT NA SUSP: 2.0000 | Freq: Every day | NASAL | Status: DC

## 2012-05-08 NOTE — Telephone Encounter (Signed)
I did call the patient and she told me that she takes the medication as needed for her GERD.   Thanks, [employer]

## 2012-06-02 ENCOUNTER — Telehealth: Payer: Self-pay | Admitting: *Deleted

## 2012-06-02 NOTE — Telephone Encounter (Signed)
Fax from [facility].  They wants to know if pt is on Glipizide and / or Metformin. Metformin was d/c per office visit 03/07/12 ?  But both meds were refilled on 05/08/12. Pt told the pharmacy Glipizide had been d/c.  Please clarify.   Thanks

## 2012-06-05 ENCOUNTER — Other Ambulatory Visit: Payer: Self-pay | Admitting: Internal Medicine

## 2012-06-06 NOTE — Telephone Encounter (Signed)
Patient is right- she is not on the glipizide anymore.  I will take glipizide out of her medication list.  Thanks for the clarification.  Payden Docter

## 2012-06-20 ENCOUNTER — Encounter: Payer: Self-pay | Admitting: Internal Medicine

## 2012-08-01 ENCOUNTER — Other Ambulatory Visit: Payer: Self-pay | Admitting: Internal Medicine

## 2012-08-01 DIAGNOSIS — J449 Chronic obstructive pulmonary disease, unspecified: Secondary | ICD-10-CM

## 2012-09-11 ENCOUNTER — Encounter: Payer: Self-pay | Admitting: Internal Medicine

## 2012-09-11 NOTE — Progress Notes (Signed)
Patient ID: [name], female   DOB: 02-20-1951, 61 y.o.   MRN: [id number]  This patient is a CHRONIC NO-SHOW PATIENT that has a history of HYPERTENSION.  Please make sure to address hypertension during her next clinic appointment, and intervene as appropriate.    Within the AVS, please incorporate the following smartphrase: .htntips   Pertinent Data: BP Readings from Last 3 Encounters:  04/21/12 128/76  04/02/12 159/93  03/07/12 125/76    BMI: Estimated Body mass index is 36.22 kg/(m^2) as calculated from the following:   Height as of 03/07/12: 5' 6.5"(1.689 m).   Weight as of 03/07/12: 227 lb 12.8 oz(103.329 kg).  Smoking History: History  Smoking status  . Current Some Day Smoker -- 0.3 packs/day for 40 years  . Types: Cigarettes  Smokeless tobacco  . Never Used    Comment: given QUIT line info - husband states he will quit when she does    Last Basic Metabolic Panel:    Component Value Date/Time   NA 137 02/23/2012 0645   K 3.9 02/23/2012 0645   CL 101 02/23/2012 0645   CO2 23 02/23/2012 0645   BUN 12 02/23/2012 0645   CREATININE 0.64 02/23/2012 0645   GLUCOSE 132* 02/23/2012 0645   CALCIUM 9.3 02/23/2012 0645    Allergies: No Known Allergies

## 2012-09-29 ENCOUNTER — Encounter: Payer: Self-pay | Admitting: Internal Medicine

## 2012-09-29 ENCOUNTER — Other Ambulatory Visit: Payer: Self-pay | Admitting: Internal Medicine

## 2012-09-29 ENCOUNTER — Telehealth: Payer: Self-pay | Admitting: Internal Medicine

## 2012-09-29 DIAGNOSIS — M1711 Unilateral primary osteoarthritis, right knee: Secondary | ICD-10-CM | POA: Insufficient documentation

## 2012-09-29 DIAGNOSIS — I259 Chronic ischemic heart disease, unspecified: Secondary | ICD-10-CM | POA: Insufficient documentation

## 2012-09-29 NOTE — Telephone Encounter (Signed)
Chronic No Show Patient or Uncontrolled HTN  Diagnoses  1. Diabetes, uncontrolled  -HA1C 7.6 in 02/2012. On Metformin 500 mg bid  -She needs Urine Albumin/Creatinine ratio at the next visit  2. HTN, uncontrolled -On Lisinopril/HCTZ 20-25 mg qd, Coreg 6.25 mg bid  3. HLD -02/2012 TC 176, TG 129, HDL 36 LDL 114. On Pravastatin 40 mg qhs  4. Depression -On Celexa 40 mg qd  5. COPD with history of tobacco abuse -On Albuterol inhaler and nebulizer, Symbicort, Spiriva  6. GERD -Prilosec  7. History of inferior wall ischemia noted 08/2004 on Nuclear myocardial scan -Cont. ACEI and BB  8. Health maintenance -She is due for a mammogram  Medications I called [facility] in [place] and she has 2 refills on meds (Metformin, Albuterol neb, Coreg, Celexa, Flonase, Prilosec, Pravastatin, Spiriva, Ventolin, Lisinopril/HCTZ  I called [facility] on [address]. She has filled Vicodin there on 04/21/12 by Dr. Bradd Canary, Claritin and Triamcinolone Cream   Next visit -Mammogram  -Urine for Alb/Cr. -colonoscopy  Shirlee Latch MD

## 2012-11-03 ENCOUNTER — Encounter (HOSPITAL_COMMUNITY): Payer: Self-pay

## 2012-11-03 ENCOUNTER — Emergency Department (INDEPENDENT_AMBULATORY_CARE_PROVIDER_SITE_OTHER)
Admission: EM | Admit: 2012-11-03 | Discharge: 2012-11-03 | Disposition: A | Discharge status: Home / Self Care | Payer: Medicare Other | Source: Home / Self Care | Attending: Family Medicine | Admitting: Family Medicine

## 2012-11-03 ENCOUNTER — Emergency Department (INDEPENDENT_AMBULATORY_CARE_PROVIDER_SITE_OTHER): Payer: Medicare Other

## 2012-11-03 DIAGNOSIS — J410 Simple chronic bronchitis: Secondary | ICD-10-CM

## 2012-11-03 DIAGNOSIS — R0602 Shortness of breath: Secondary | ICD-10-CM | POA: Diagnosis not present

## 2012-11-03 DIAGNOSIS — J441 Chronic obstructive pulmonary disease with (acute) exacerbation: Secondary | ICD-10-CM | POA: Diagnosis not present

## 2012-11-03 DIAGNOSIS — N644 Mastodynia: Secondary | ICD-10-CM | POA: Diagnosis not present

## 2012-11-03 DIAGNOSIS — Z720 Tobacco use: Secondary | ICD-10-CM

## 2012-11-03 DIAGNOSIS — R0789 Other chest pain: Secondary | ICD-10-CM

## 2012-11-03 DIAGNOSIS — J984 Other disorders of lung: Secondary | ICD-10-CM | POA: Diagnosis not present

## 2012-11-03 DIAGNOSIS — R071 Chest pain on breathing: Secondary | ICD-10-CM

## 2012-11-03 DIAGNOSIS — S298XXA Other specified injuries of thorax, initial encounter: Secondary | ICD-10-CM | POA: Diagnosis not present

## 2012-11-03 DIAGNOSIS — J449 Chronic obstructive pulmonary disease, unspecified: Secondary | ICD-10-CM | POA: Diagnosis not present

## 2012-11-03 MED ORDER — METHYLPREDNISOLONE SODIUM SUCC 125 MG IJ SOLR
INTRAMUSCULAR | Status: AC
  Filled 2012-11-03: qty 2

## 2012-11-03 MED ORDER — METHYLPREDNISOLONE SODIUM SUCC 125 MG IJ SOLR
125.0000 mg | Freq: Once | INTRAMUSCULAR | Status: AC
  Administered 2012-11-03: 125 mg via INTRAMUSCULAR

## 2012-11-03 MED ORDER — HYDROCODONE-ACETAMINOPHEN 5-325 MG PO TABS: 1.0000 | ORAL_TABLET | Freq: Four times a day (QID) | ORAL | Status: DC | PRN

## 2012-11-03 MED ORDER — GUAIFENESIN-CODEINE 100-10 MG/5ML PO SYRP: 10.0000 mL | ORAL_SOLUTION | Freq: Four times a day (QID) | ORAL | Status: DC | PRN

## 2012-11-03 MED ORDER — HYDROCOD POLST-CHLORPHEN POLST 10-8 MG/5ML PO LQCR: 5.0000 mL | Freq: Two times a day (BID) | ORAL | Status: DC | PRN

## 2012-11-03 MED ORDER — LEVOFLOXACIN 500 MG PO TABS: 500.0000 mg | ORAL_TABLET | Freq: Every day | ORAL | Status: DC

## 2012-11-03 NOTE — ED Notes (Signed)
1 week duration of cough, congestion, fever, pain in ribs w cough

## 2012-11-03 NOTE — ED Provider Notes (Signed)
History     CSN: [id number]  Arrival date & time 11/03/12  1034   First MD Initiated Contact with Patient 11/03/12 1049      Chief Complaint  Patient presents with  . Influenza    (Consider location/radiation/quality/duration/timing/severity/associated sxs/prior treatment) Patient is a 61 y.o. female presenting with cough. The history is provided by the patient.  Cough This is a new problem. The current episode started more than 1 week ago. The problem has been gradually worsening (2 d ago felt pain in right chest with protracted cough.). The cough is productive of purulent sputum. The maximum temperature recorded prior to her arrival was 100 to 100.9 F. Associated symptoms include chest pain, rhinorrhea and shortness of breath. She is a smoker. Her past medical history is significant for bronchitis.    Past Medical History  Diagnosis Date  . Hyperlipidemia   . Hypertension   . Tobacco abuse   . COPD (chronic obstructive pulmonary disease)     History of multiple hospital admissions for exercabation   . Asthma   . Breast cancer 1991    s/p lumpectomy, chemotherapy and radiation therapy in 1991. Mammogram in 2007 was normal.  . Sigmoid diverticulitis 80/2008  . Anxiety   . Depression   . Obesity   . GERD (gastroesophageal reflux disease)   . Heart murmur 10/05/11    "first time I ever heard I had one was today"  . Pneumonia   . Shortness of breath 10/05/11    "at rest; lying down; w/exertion"  . Diabetes mellitus   . Bronchitis     h/o  . Diarrhea     h/o  . Constipated     h/o    Past Surgical History  Procedure Date  . Dobutamine stress echo 08/2004    Inferior ischemia, normal LV systolic function, no significant CAD  . Abdominal hysterectomy   . Breast surgery 1991    lumphectomy right breast  . Neck surgery 2012    "Dr. [name]  put plate in; did something to my vertebrae"  . Cardiac catheterization     left cardiac cath 08/2004 normal systolic  function, no CAD    Family History  Problem Relation Age of Onset  . Cancer Mother     History  Substance Use Topics  . Smoking status: Current Some Day Smoker -- 0.3 packs/day for 40 years    Types: Cigarettes  . Smokeless tobacco: Never Used     Comment: given QUIT line info - husband states he will quit when she does  . Alcohol Use: 2.4 oz/week    4 Cans of beer per week     Comment: "only on the weekends"    OB History    Grav Para Term Preterm Abortions TAB SAB Ect Mult Living                  Review of Systems  Constitutional: Positive for fever.  HENT: Positive for rhinorrhea.   Respiratory: Positive for cough and shortness of breath.   Cardiovascular: Positive for chest pain.    Allergies  Review of patient's allergies indicates no known allergies.  Home Medications   Current Outpatient Rx  Name  Route  Sig  Dispense  Refill  . ALBUTEROL SULFATE HFA 108 (90 BASE) MCG/ACT IN AERS   Inhalation   Inhale 2 puffs into the lungs every 4 (four) hours as needed for wheezing.   1 Inhaler   11   . ALBUTEROL  SULFATE (2.5 MG/3ML) 0.083% IN NEBU   Nebulization   Take 3 mLs (2.5 mg total) by nebulization every 4 (four) hours as needed. For shortness of breath   75 mL   3   . CARVEDILOL 6.25 MG PO TABS   Oral   Take 6.25 mg by mouth 2 (two) times daily with a meal.         . HYDROCOD POLST-CPM POLST ER 10-8 MG/5ML PO LQCR   Oral   Take 5 mLs by mouth every 12 (twelve) hours as needed. For cough   115 mL   0   . CITALOPRAM HYDROBROMIDE 40 MG PO TABS   Oral   Take 1 tablet (40 mg total) by mouth daily.   30 tablet   3   . FLUTICASONE PROPIONATE 50 MCG/ACT NA SUSP   Nasal   Place 2 sprays into the nose daily.   16 g   3   . HYDROCODONE-ACETAMINOPHEN 5-325 MG PO TABS   Oral   Take 1 tablet by mouth every 6 (six) hours as needed for pain.   15 tablet   0   . LEVOFLOXACIN 500 MG PO TABS   Oral   Take 1 tablet (500 mg total) by mouth daily.   7  tablet   0   . LISINOPRIL-HYDROCHLOROTHIAZIDE 20-25 MG PO TABS      TAKE 1 TABLET BY MOUTH DAILY   30 tablet   PRN   . LORATADINE 10 MG PO TABS   Oral   Take 1 tablet (10 mg total) by mouth daily.   30 tablet   3   . LORATADINE 10 MG PO TABS   Oral   Take 1 tablet (10 mg total) by mouth daily.   14 tablet   0   . METFORMIN HCL 500 MG PO TABS      TAKE 1 TABLET BY MOUTH TWICE DAILY WITH MEAL   60 tablet   PRN   . OMEPRAZOLE 20 MG PO CPDR      TAKE 1 CAPSULE BY MOUTH DAILY   30 capsule   3   . PRAVASTATIN SODIUM 40 MG PO TABS      TAKE 1 TABLET BY MOUTH DAILY   30 tablet   PRN   . SYMBICORT 80-4.5 MCG/ACT IN AERO      INHALE 2 PUFFS BY MOUTH TWICE DAILY   1 Inhaler   PRN   . TIOTROPIUM BROMIDE MONOHYDRATE 18 MCG IN CAPS   Inhalation   Place 18 mcg into inhaler and inhale daily.         . TRIAMCINOLONE ACETONIDE 0.1 % EX CREA   Topical   Apply topically 2 (two) times daily. Apply for 2 weeks. May use on face   30 g   0     BP 134/85  Pulse 72  Temp 99.4 F (37.4 C) (Oral)  Resp 18  SpO2 100%  Physical Exam  Nursing note and vitals reviewed. Constitutional: She is oriented to person, place, and time. She appears well-developed and well-nourished.  HENT:  Head: Normocephalic.  Right Ear: External ear normal.  Left Ear: External ear normal.  Mouth/Throat: Oropharynx is clear and moist.  Eyes: Pupils are equal, round, and reactive to light.  Neck: Normal range of motion. Neck supple.  Cardiovascular: Normal rate, normal heart sounds and intact distal pulses.   Pulmonary/Chest: Effort normal.  Abdominal: Soft. Bowel sounds are normal.  Neurological: She is alert and oriented to  person, place, and time.  Skin: Skin is warm and dry.    ED Course  Procedures (including critical care time)  Labs Reviewed - No data to display Dg Chest 2 View  11/03/2012  *RADIOLOGY REPORT*  Clinical Data: Cough, smoker.  CHEST - 2 VIEW  Comparison:  02/21/2012  Findings: There is hyperinflation of the lungs compatible with COPD.  Heart and mediastinal contours are within normal limits.  No focal opacities or effusions.  No acute bony abnormality. Surgical clips in the right axilla.  Prior anterior fusion in the lower cervical and upper thoracic spine.  IMPRESSION: COPD.  No active disease.   Original Report Authenticated By: Charlett Nose, M.D.    Dg Ribs Unilateral Right  11/03/2012  *RADIOLOGY REPORT*  Clinical Data: Right-sided breast pain.  RIGHT RIBS - 2 VIEW  Comparison: No priors.  Findings: Two views of the right ribs demonstrate a metallic marker in place posterior to the eleventh rib.  No acute displaced rib fractures are identified.  Surgical clips project over the right axillary region, likely from prior axillary nodal dissection. Orthopedic fixation hardware is incidentally imaged in the lower cervical spine.  IMPRESSION: 1.  No acute displaced right-sided rib fractures are identified.   Original Report Authenticated By: Trudie Reed, M.D.      1. Bronchitis due to tobacco use   2. Acute chest wall pain       MDM          Linna Hoff, MD 11/03/12 [phone]

## 2012-11-04 ENCOUNTER — Emergency Department (HOSPITAL_COMMUNITY)
Admission: EM | Admit: 2012-11-04 | Discharge: 2012-11-05 | Disposition: A | Discharge status: Home / Self Care | Payer: Medicare Other | Attending: Emergency Medicine | Admitting: Emergency Medicine

## 2012-11-04 ENCOUNTER — Emergency Department (HOSPITAL_COMMUNITY): Payer: Medicare Other

## 2012-11-04 ENCOUNTER — Encounter (HOSPITAL_COMMUNITY): Payer: Self-pay | Admitting: *Deleted

## 2012-11-04 DIAGNOSIS — F3289 Other specified depressive episodes: Secondary | ICD-10-CM | POA: Insufficient documentation

## 2012-11-04 DIAGNOSIS — J3489 Other specified disorders of nose and nasal sinuses: Secondary | ICD-10-CM | POA: Diagnosis not present

## 2012-11-04 DIAGNOSIS — R0602 Shortness of breath: Secondary | ICD-10-CM | POA: Diagnosis not present

## 2012-11-04 DIAGNOSIS — R05 Cough: Secondary | ICD-10-CM | POA: Insufficient documentation

## 2012-11-04 DIAGNOSIS — Z8701 Personal history of pneumonia (recurrent): Secondary | ICD-10-CM | POA: Diagnosis not present

## 2012-11-04 DIAGNOSIS — Z79899 Other long term (current) drug therapy: Secondary | ICD-10-CM | POA: Insufficient documentation

## 2012-11-04 DIAGNOSIS — Z8719 Personal history of other diseases of the digestive system: Secondary | ICD-10-CM | POA: Diagnosis not present

## 2012-11-04 DIAGNOSIS — J45909 Unspecified asthma, uncomplicated: Secondary | ICD-10-CM | POA: Insufficient documentation

## 2012-11-04 DIAGNOSIS — F411 Generalized anxiety disorder: Secondary | ICD-10-CM | POA: Diagnosis not present

## 2012-11-04 DIAGNOSIS — Z8709 Personal history of other diseases of the respiratory system: Secondary | ICD-10-CM | POA: Insufficient documentation

## 2012-11-04 DIAGNOSIS — F17200 Nicotine dependence, unspecified, uncomplicated: Secondary | ICD-10-CM | POA: Insufficient documentation

## 2012-11-04 DIAGNOSIS — R61 Generalized hyperhidrosis: Secondary | ICD-10-CM | POA: Insufficient documentation

## 2012-11-04 DIAGNOSIS — F329 Major depressive disorder, single episode, unspecified: Secondary | ICD-10-CM | POA: Insufficient documentation

## 2012-11-04 DIAGNOSIS — Y9389 Activity, other specified: Secondary | ICD-10-CM | POA: Insufficient documentation

## 2012-11-04 DIAGNOSIS — R059 Cough, unspecified: Secondary | ICD-10-CM | POA: Insufficient documentation

## 2012-11-04 DIAGNOSIS — Z9861 Coronary angioplasty status: Secondary | ICD-10-CM | POA: Diagnosis not present

## 2012-11-04 DIAGNOSIS — R011 Cardiac murmur, unspecified: Secondary | ICD-10-CM | POA: Diagnosis not present

## 2012-11-04 DIAGNOSIS — I10 Essential (primary) hypertension: Secondary | ICD-10-CM | POA: Diagnosis not present

## 2012-11-04 DIAGNOSIS — K219 Gastro-esophageal reflux disease without esophagitis: Secondary | ICD-10-CM | POA: Insufficient documentation

## 2012-11-04 DIAGNOSIS — S298XXA Other specified injuries of thorax, initial encounter: Secondary | ICD-10-CM | POA: Diagnosis not present

## 2012-11-04 DIAGNOSIS — X503XXA Overexertion from repetitive movements, initial encounter: Secondary | ICD-10-CM | POA: Insufficient documentation

## 2012-11-04 DIAGNOSIS — E785 Hyperlipidemia, unspecified: Secondary | ICD-10-CM | POA: Insufficient documentation

## 2012-11-04 DIAGNOSIS — E669 Obesity, unspecified: Secondary | ICD-10-CM | POA: Diagnosis not present

## 2012-11-04 DIAGNOSIS — Z853 Personal history of malignant neoplasm of breast: Secondary | ICD-10-CM | POA: Diagnosis not present

## 2012-11-04 DIAGNOSIS — Z90710 Acquired absence of both cervix and uterus: Secondary | ICD-10-CM | POA: Diagnosis not present

## 2012-11-04 DIAGNOSIS — E119 Type 2 diabetes mellitus without complications: Secondary | ICD-10-CM | POA: Insufficient documentation

## 2012-11-04 DIAGNOSIS — J441 Chronic obstructive pulmonary disease with (acute) exacerbation: Secondary | ICD-10-CM | POA: Insufficient documentation

## 2012-11-04 DIAGNOSIS — Y929 Unspecified place or not applicable: Secondary | ICD-10-CM | POA: Insufficient documentation

## 2012-11-04 LAB — CBC WITH DIFFERENTIAL/PLATELET
Basophils Absolute: 0 10*3/uL (ref 0.0–0.1)
Eosinophils Relative: 0 % (ref 0–5)
HCT: 36.9 % (ref 36.0–46.0)
Lymphocytes Relative: 12 % (ref 12–46)
MCH: 27.4 pg (ref 26.0–34.0)
MCHC: 31.4 g/dL (ref 30.0–36.0)
MCV: 87.2 fL (ref 78.0–100.0)
Monocytes Absolute: 0.8 10*3/uL (ref 0.1–1.0)
RDW: 13.4 % (ref 11.5–15.5)
WBC: 11.8 10*3/uL — ABNORMAL HIGH (ref 4.0–10.5)

## 2012-11-04 LAB — BASIC METABOLIC PANEL
CO2: 27 mEq/L (ref 19–32)
Calcium: 10.1 mg/dL (ref 8.4–10.5)
Creatinine, Ser: 0.72 mg/dL (ref 0.50–1.10)

## 2012-11-04 MED ORDER — ALBUTEROL SULFATE (5 MG/ML) 0.5% IN NEBU
2.5000 mg | INHALATION_SOLUTION | Freq: Once | RESPIRATORY_TRACT | Status: AC
  Administered 2012-11-04: 2.5 mg via RESPIRATORY_TRACT

## 2012-11-04 MED ORDER — ALBUTEROL SULFATE (5 MG/ML) 0.5% IN NEBU
2.5000 mg | INHALATION_SOLUTION | Freq: Once | RESPIRATORY_TRACT | Status: AC
  Administered 2012-11-04: 2.5 mg via RESPIRATORY_TRACT
  Filled 2012-11-04: qty 0.5

## 2012-11-04 MED ORDER — HYDROMORPHONE HCL PF 1 MG/ML IJ SOLN
0.5000 mg | Freq: Once | INTRAMUSCULAR | Status: AC
  Administered 2012-11-05: 0.5 mg via INTRAVENOUS
  Filled 2012-11-04: qty 1

## 2012-11-04 MED ORDER — OXYMETAZOLINE HCL 0.05 % NA SOLN
1.0000 | Freq: Once | NASAL | Status: AC
  Administered 2012-11-05: 1 via NASAL
  Filled 2012-11-04: qty 15

## 2012-11-04 MED ORDER — BENZONATATE 100 MG PO CAPS
200.0000 mg | ORAL_CAPSULE | Freq: Once | ORAL | Status: AC
  Administered 2012-11-05: 200 mg via ORAL
  Filled 2012-11-04: qty 2

## 2012-11-04 MED ORDER — ALBUTEROL SULFATE (5 MG/ML) 0.5% IN NEBU
5.0000 mg | INHALATION_SOLUTION | Freq: Once | RESPIRATORY_TRACT | Status: AC
  Administered 2012-11-05: 5 mg via RESPIRATORY_TRACT
  Filled 2012-11-04: qty 1

## 2012-11-04 MED ORDER — METHYLPREDNISOLONE SODIUM SUCC 125 MG IJ SOLR
125.0000 mg | Freq: Once | INTRAMUSCULAR | Status: AC
  Administered 2012-11-04: 125 mg via INTRAVENOUS
  Filled 2012-11-04 (×2): qty 2

## 2012-11-04 MED ORDER — ALBUTEROL SULFATE (5 MG/ML) 0.5% IN NEBU
INHALATION_SOLUTION | RESPIRATORY_TRACT | Status: AC
  Filled 2012-11-04: qty 0.5

## 2012-11-04 NOTE — ED Notes (Signed)
Patient transported to X-ray 

## 2012-11-04 NOTE — ED Notes (Signed)
Pt taken to ed room a5 by emt in wheelchair with neb in progress

## 2012-11-04 NOTE — ED Notes (Signed)
Pt states she has been having trouble breathing and has been seen at an urgent care center that stated she had a pulled muscle in her ribs from coughing.  Pt in obviously distress with auditory wheezing noted.  Pt states states medication given for cough have not been helpful, and home nebulizer has only provided minor relief.  Pt reports weakness as well

## 2012-11-04 NOTE — ED Provider Notes (Signed)
History     CSN: [id number]  Arrival date & time 11/04/12  2215   First MD Initiated Contact with Patient 11/04/12 2302      Chief Complaint  Patient presents with  . Shortness of Breath  . Cough  . Rib Injury    (Consider location/radiation/quality/duration/timing/severity/associated sxs/prior treatment) HPIDeborah A [name] is a 61 y.o. female with a history of COPD and is a prior smoker as of 3 days ago, presents with 40s history of cough, nasal congestion that is "out of control", severe, is been getting worse and she went to see urgent care yesterday. Patient says they did not give her steroids but started her on Levaquin and she took some prednisone she had home (10 mg.) She denies any chest pain, but does say that she's had some right-sided rib pain, it was negative for fracture yesterday on chest x-ray at urgent care. No abdominal pain, nausea or vomiting. She says she wakes up in a sweat occasionally but denies any fevers or chills. Today patient's shortness of breath got worse, she is unable to walk 10 or 20 feet without becoming extremely short of breath and having to rest. She has tried home inhalers without relief. She was given a cough syrup which is not helping either.  Past Medical History  Diagnosis Date  . Hyperlipidemia   . Hypertension   . Tobacco abuse   . COPD (chronic obstructive pulmonary disease)     History of multiple hospital admissions for exercabation   . Asthma   . Breast cancer 1991    s/p lumpectomy, chemotherapy and radiation therapy in 1991. Mammogram in 2007 was normal.  . Sigmoid diverticulitis 80/2008  . Anxiety   . Depression   . Obesity   . GERD (gastroesophageal reflux disease)   . Heart murmur 10/05/11    "first time I ever heard I had one was today"  . Pneumonia   . Shortness of breath 10/05/11    "at rest; lying down; w/exertion"  . Diabetes mellitus   . Bronchitis     h/o  . Diarrhea     h/o  . Constipated     h/o    Past  Surgical History  Procedure Date  . Dobutamine stress echo 08/2004    Inferior ischemia, normal LV systolic function, no significant CAD  . Abdominal hysterectomy   . Breast surgery 1991    lumphectomy right breast  . Neck surgery 2012    "Dr. [name]  put plate in; did something to my vertebrae"  . Cardiac catheterization     left cardiac cath 08/2004 normal systolic function, no CAD    Family History  Problem Relation Age of Onset  . Cancer Mother     History  Substance Use Topics  . Smoking status: Current Some Day Smoker -- 0.3 packs/day for 40 years    Types: Cigarettes  . Smokeless tobacco: Never Used     Comment: given QUIT line info - husband states he will quit when she does  . Alcohol Use: 2.4 oz/week    4 Cans of beer per week     Comment: "only on the weekends"    OB History    Grav Para Term Preterm Abortions TAB SAB Ect Mult Living                  Review of Systems At least 10pt or greater review of systems completed and are negative except where specified in the HPI.  Allergies  Review of patient's allergies indicates no known allergies.  Home Medications   Current Outpatient Rx  Name  Route  Sig  Dispense  Refill  . ALBUTEROL SULFATE HFA 108 (90 BASE) MCG/ACT IN AERS   Inhalation   Inhale 2 puffs into the lungs every 4 (four) hours as needed for wheezing.   1 Inhaler   11   . ALBUTEROL SULFATE (2.5 MG/3ML) 0.083% IN NEBU   Nebulization   Take 3 mLs (2.5 mg total) by nebulization every 4 (four) hours as needed. For shortness of breath   75 mL   3   . CARVEDILOL 6.25 MG PO TABS   Oral   Take 6.25 mg by mouth 2 (two) times daily with a meal.         . HYDROCOD POLST-CPM POLST ER 10-8 MG/5ML PO LQCR   Oral   Take 5 mLs by mouth every 12 (twelve) hours as needed. For cough   115 mL   0   . CITALOPRAM HYDROBROMIDE 40 MG PO TABS   Oral   Take 1 tablet (40 mg total) by mouth daily.   30 tablet   3   . FLUTICASONE PROPIONATE 50  MCG/ACT NA SUSP   Nasal   Place 2 sprays into the nose daily.   16 g   3   . GUAIFENESIN-CODEINE 100-10 MG/5ML PO SYRP   Oral   Take 10 mLs by mouth 4 (four) times daily as needed for cough.   180 mL   0   . HYDROCODONE-ACETAMINOPHEN 5-325 MG PO TABS   Oral   Take 1 tablet by mouth every 6 (six) hours as needed for pain.   15 tablet   0   . LEVOFLOXACIN 500 MG PO TABS   Oral   Take 1 tablet (500 mg total) by mouth daily.   7 tablet   0   . LISINOPRIL-HYDROCHLOROTHIAZIDE 20-25 MG PO TABS      TAKE 1 TABLET BY MOUTH DAILY   30 tablet   PRN   . LORATADINE 10 MG PO TABS   Oral   Take 1 tablet (10 mg total) by mouth daily.   30 tablet   3   . LORATADINE 10 MG PO TABS   Oral   Take 1 tablet (10 mg total) by mouth daily.   14 tablet   0   . METFORMIN HCL 500 MG PO TABS      TAKE 1 TABLET BY MOUTH TWICE DAILY WITH MEAL   60 tablet   PRN   . OMEPRAZOLE 20 MG PO CPDR      TAKE 1 CAPSULE BY MOUTH DAILY   30 capsule   3   . PRAVASTATIN SODIUM 40 MG PO TABS      TAKE 1 TABLET BY MOUTH DAILY   30 tablet   PRN   . SYMBICORT 80-4.5 MCG/ACT IN AERO      INHALE 2 PUFFS BY MOUTH TWICE DAILY   1 Inhaler   PRN   . TIOTROPIUM BROMIDE MONOHYDRATE 18 MCG IN CAPS   Inhalation   Place 18 mcg into inhaler and inhale daily.         . TRIAMCINOLONE ACETONIDE 0.1 % EX CREA   Topical   Apply topically 2 (two) times daily. Apply for 2 weeks. May use on face   30 g   0     BP 139/95  Pulse 106  Temp 98 F (36.7 C) (Oral)  Resp 24  Ht 5\' 5"  (1.651 m)  Wt 205 lb (92.987 kg)  BMI 34.11 kg/m2  SpO2 98%  Physical Exam  Nursing notes reviewed.  Electronic medical record reviewed. VITAL SIGNS:   Filed Vitals:   11/04/12 2233  BP: 139/95  Pulse: 106  Temp: 98 F (36.7 C)  TempSrc: Oral  Resp: 24  Height: 5\' 5"  (1.651 m)  Weight: 205 lb (92.987 kg)  SpO2: 98%   CONSTITUTIONAL: Awake, oriented x4, appears non-toxic HENT: Atraumatic, normocephalic, oral  mucosa pink and moist, airway patent. Nares patent without drainage. External ears normal. EYES: Conjunctiva clear, EOMI, PERRLA NECK: Trachea midline, non-tender, supple CARDIOVASCULAR: Normal heart rate, Normal rhythm, No murmurs, rubs, gallops PULMONARY/CHEST: Decreased breath sounds bilaterally - greatest at the right base, widespread wheezing. No rales. Rhonchi. ABDOMINAL: Non-distended, soft, non-tender - no rebound or guarding.  BS normal. NEUROLOGIC: Non-focal, moving all four extremities, no gross sensory or motor deficits. EXTREMITIES: No clubbing, cyanosis, or edema SKIN: Warm, Dry, No erythema, No rash  ED Course  Procedures (including critical care time)   Labs Reviewed  CBC WITH DIFFERENTIAL  BASIC METABOLIC PANEL   Dg Chest 2 View  11/03/2012  *RADIOLOGY REPORT*  Clinical Data: Cough, smoker.  CHEST - 2 VIEW  Comparison: 02/21/2012  Findings: There is hyperinflation of the lungs compatible with COPD.  Heart and mediastinal contours are within normal limits.  No focal opacities or effusions.  No acute bony abnormality. Surgical clips in the right axilla.  Prior anterior fusion in the lower cervical and upper thoracic spine.  IMPRESSION: COPD.  No active disease.   Original Report Authenticated By: Charlett Nose, M.D.    Dg Ribs Unilateral Right  11/03/2012  *RADIOLOGY REPORT*  Clinical Data: Right-sided breast pain.  RIGHT RIBS - 2 VIEW  Comparison: No priors.  Findings: Two views of the right ribs demonstrate a metallic marker in place posterior to the eleventh rib.  No acute displaced rib fractures are identified.  Surgical clips project over the right axillary region, likely from prior axillary nodal dissection. Orthopedic fixation hardware is incidentally imaged in the lower cervical spine.  IMPRESSION: 1.  No acute displaced right-sided rib fractures are identified.   Original Report Authenticated By: Trudie Reed, M.D.      1. COPD exacerbation       MDM    [name] is a 61 y.o. female presents with COPD exacerbation.  Currently taking levaquin.  Pt improves with albuterol.  Was not Rx steroids at urgent care for some reason.  Pt given steroids today.  Consider PE - elevated D-dimer, no PE seen on CT angi of chest.  No PNA seen. Spiculated nodule seen on chest CT - discussed with patient adn told she needs to f/u with PCP for mammogram/US.  Pt feeling better, DC to home with steroids.  Has albuterol, continue levaquin.  I explained the diagnosis and have given explicit precautions to return to the ER including any other new or worsening symptoms. The patient understands and accepts the medical plan as it's been dictated and I have answered their questions. Discharge instructions concerning home care and prescriptions have been given.  The patient is STABLE and is discharged to home in good condition.         Jones Skene, MD 11/06/12 2033

## 2012-11-05 ENCOUNTER — Emergency Department (HOSPITAL_COMMUNITY): Payer: Medicare Other

## 2012-11-05 DIAGNOSIS — J984 Other disorders of lung: Secondary | ICD-10-CM | POA: Diagnosis not present

## 2012-11-05 LAB — D-DIMER, QUANTITATIVE: D-Dimer, Quant: 4.75 ug/mL-FEU — ABNORMAL HIGH (ref 0.00–0.48)

## 2012-11-05 MED ORDER — PREDNISONE 20 MG PO TABS: 60.0000 mg | ORAL_TABLET | Freq: Every day | ORAL | Status: DC

## 2012-11-05 MED ORDER — HYDROMORPHONE HCL PF 1 MG/ML IJ SOLN
1.0000 mg | Freq: Once | INTRAMUSCULAR | Status: AC
  Administered 2012-11-05: 1 mg via INTRAVENOUS
  Filled 2012-11-05: qty 1

## 2012-11-05 MED ORDER — LORAZEPAM 2 MG/ML IJ SOLN
1.0000 mg | Freq: Once | INTRAMUSCULAR | Status: AC
  Administered 2012-11-05: 1 mg via INTRAVENOUS
  Filled 2012-11-05: qty 1

## 2012-11-05 MED ORDER — IOHEXOL 350 MG/ML SOLN
100.0000 mL | Freq: Once | INTRAVENOUS | Status: AC | PRN
  Administered 2012-11-05: 100 mL via INTRAVENOUS

## 2012-11-05 MED ORDER — ALBUTEROL SULFATE (5 MG/ML) 0.5% IN NEBU
5.0000 mg | INHALATION_SOLUTION | RESPIRATORY_TRACT | Status: DC
  Administered 2012-11-05: 5 mg via RESPIRATORY_TRACT
  Filled 2012-11-05: qty 1

## 2012-11-05 MED ORDER — IPRATROPIUM BROMIDE 0.02 % IN SOLN
0.5000 mg | RESPIRATORY_TRACT | Status: DC
  Administered 2012-11-05: 0.5 mg via RESPIRATORY_TRACT
  Filled 2012-11-05: qty 2.5

## 2012-11-05 NOTE — ED Notes (Addendum)
Pt's 02 remained WNL while ambulating, however pt became markedly SOB.  EDP [facility] notified.

## 2012-11-05 NOTE — ED Notes (Signed)
Unable to obtain 20g IV for CTA.  IV team nurse notified.  IV nurse in with pt.

## 2012-11-05 NOTE — ED Notes (Addendum)
IV nurse was able to obtain 20g in left AC.  CT notified.

## 2012-12-01 ENCOUNTER — Encounter: Payer: Self-pay | Admitting: Internal Medicine

## 2012-12-01 ENCOUNTER — Ambulatory Visit (HOSPITAL_COMMUNITY)
Admission: RE | Admit: 2012-12-01 | Discharge: 2012-12-01 | Disposition: A | Discharge status: Home / Self Care | Payer: Medicare Other | Source: Ambulatory Visit | Attending: Internal Medicine | Admitting: Internal Medicine

## 2012-12-01 ENCOUNTER — Ambulatory Visit (INDEPENDENT_AMBULATORY_CARE_PROVIDER_SITE_OTHER): Payer: Medicare Other | Admitting: Internal Medicine

## 2012-12-01 VITALS — BP 104/70 | HR 92 | Temp 98.6°F | Ht 64.0 in | Wt 217.3 lb

## 2012-12-01 DIAGNOSIS — E119 Type 2 diabetes mellitus without complications: Secondary | ICD-10-CM | POA: Diagnosis not present

## 2012-12-01 DIAGNOSIS — M25512 Pain in left shoulder: Secondary | ICD-10-CM

## 2012-12-01 DIAGNOSIS — Z299 Encounter for prophylactic measures, unspecified: Secondary | ICD-10-CM | POA: Diagnosis not present

## 2012-12-01 DIAGNOSIS — G4700 Insomnia, unspecified: Secondary | ICD-10-CM | POA: Insufficient documentation

## 2012-12-01 DIAGNOSIS — N63 Unspecified lump in unspecified breast: Secondary | ICD-10-CM

## 2012-12-01 DIAGNOSIS — M259 Joint disorder, unspecified: Secondary | ICD-10-CM | POA: Insufficient documentation

## 2012-12-01 DIAGNOSIS — Z23 Encounter for immunization: Secondary | ICD-10-CM

## 2012-12-01 DIAGNOSIS — M25519 Pain in unspecified shoulder: Secondary | ICD-10-CM | POA: Diagnosis not present

## 2012-12-01 DIAGNOSIS — Z853 Personal history of malignant neoplasm of breast: Secondary | ICD-10-CM | POA: Insufficient documentation

## 2012-12-01 DIAGNOSIS — X58XXXA Exposure to other specified factors, initial encounter: Secondary | ICD-10-CM | POA: Insufficient documentation

## 2012-12-01 DIAGNOSIS — R928 Other abnormal and inconclusive findings on diagnostic imaging of breast: Secondary | ICD-10-CM | POA: Diagnosis not present

## 2012-12-01 DIAGNOSIS — M25511 Pain in right shoulder: Secondary | ICD-10-CM | POA: Insufficient documentation

## 2012-12-01 HISTORY — DX: Personal history of malignant neoplasm of breast: Z85.3

## 2012-12-01 LAB — LIPID PANEL
HDL: 43 mg/dL (ref >39)
LDL Cholesterol: 92 mg/dL (ref 0–99)
Total CHOL/HDL Ratio: 3.9 Ratio

## 2012-12-01 LAB — GLUCOSE, CAPILLARY: Glucose-Capillary: 127 mg/dL — ABNORMAL HIGH (ref 70–99)

## 2012-12-01 MED ORDER — ZOLPIDEM TARTRATE 5 MG PO TABS: 5.0000 mg | ORAL_TABLET | Freq: Every evening | ORAL | Status: DC | PRN

## 2012-12-01 MED ORDER — METFORMIN HCL 1000 MG PO TABS: 1000.0000 mg | ORAL_TABLET | Freq: Two times a day (BID) | ORAL | Status: DC

## 2012-12-01 MED ORDER — TRAMADOL HCL 50 MG PO TABS: 50.0000 mg | ORAL_TABLET | Freq: Three times a day (TID) | ORAL | Status: DC | PRN

## 2012-12-01 NOTE — Assessment & Plan Note (Signed)
Patient presents for 2 weeks a significant left shoulder pain. Pain with passive and active range of motion. She does over hearing a pop when she reached for something several weeks ago.  -We will start with plain films of the left shoulder -Ultram 50 mg #40, no refills given. Will need to return to clinic to reassess her shoulder in 2-4 weeks

## 2012-12-01 NOTE — Progress Notes (Signed)
Internal Medicine Clinic Visit    HPI:  [name] is a 62 y.o. year old female is a chronic no-show patient. She has a history of uncontrolled diabetes, hypertension, hyperlipidemia, depression, COPD, GERD, who presents for hospital followup for her breast mass.  She was seen in the ED on 11/03/2012 with influenza and COPD exacerbation. Took levaquin and prednisone. Breathing is better. No SOB or wheezing. No fever or chills.  During her ED visit, she receives a CT scan of the chest which showed a spiculated right breast mass. She does have a history of right-sided breast cancer s/p Lumpectomy 1991, with chemo and radiation. Last mammogram was a few years ago. Hasn't noticed any lumps or bumps anywhere.   Hasn't been sleeping well. Very worried about breast cancer recurrence, has racing thoughts at night. She has told her husband about it. Mood- depressed, sleeping all day. Has been on Celexa for a long time.  She denies having any weight loss. She does have hot flashes, has to change pillow case.  Patient is also complaining of L shoulder pain: Heard a pop when she reached for something 2 weeks ago. Worse with active ROM. Does a lot of scrapbooking and arts and crafts which has been more difficult recently secondary to pain. Hard to reach for food in microwave. Has tried tylenol which doesn't help. Hasn't tried ibuprofen or alleve.   Past Medical History  Diagnosis Date  . Hyperlipidemia   . Hypertension   . Tobacco abuse   . COPD (chronic obstructive pulmonary disease)     History of multiple hospital admissions for exercabation   . Asthma   . Breast cancer 1991    s/p lumpectomy, chemotherapy and radiation therapy in 1991. Mammogram in 2007 was normal.  . Sigmoid diverticulitis 80/2008  . Anxiety   . Depression   . Obesity   . GERD (gastroesophageal reflux disease)   . Heart murmur 10/05/11    "first time I ever heard I had one was today"  . Pneumonia   . Shortness of  breath 10/05/11    "at rest; lying down; w/exertion"  . Diabetes mellitus   . Bronchitis     h/o  . Diarrhea     h/o  . Constipated     h/o    Past Surgical History  Procedure Date  . Dobutamine stress echo 08/2004    Inferior ischemia, normal LV systolic function, no significant CAD  . Abdominal hysterectomy   . Breast surgery 1991    lumphectomy right breast  . Neck surgery 2012    "Dr. [name]  put plate in; did something to my vertebrae"  . Cardiac catheterization     left cardiac cath 08/2004 normal systolic function, no CAD     ROS:  A complete review of systems was otherwise negative, except as noted in the HPI.  Allergies: Review of patient's allergies indicates no known allergies.  Medications: Current Outpatient Prescriptions  Medication Sig Dispense Refill  . albuterol (PROVENTIL HFA;VENTOLIN HFA) 108 (90 BASE) MCG/ACT inhaler Inhale 2 puffs into the lungs every 4 (four) hours as needed for wheezing.  1 Inhaler  11  . albuterol (PROVENTIL) (2.5 MG/3ML) 0.083% nebulizer solution Take 3 mLs (2.5 mg total) by nebulization every 4 (four) hours as needed. For shortness of breath  75 mL  3  . carvedilol (COREG) 6.25 MG tablet Take 6.25 mg by mouth 2 (two) times daily with a meal.      . chlorpheniramine-HYDROcodone (  TUSSIONEX PENNKINETIC ER) 10-8 MG/5ML LQCR Take 5 mLs by mouth every 12 (twelve) hours as needed. For cough  115 mL  0  . citalopram (CELEXA) 40 MG tablet Take 1 tablet (40 mg total) by mouth daily.  30 tablet  3  . citalopram (CELEXA) 40 MG tablet Take 20 mg by mouth daily.      . fluticasone (FLONASE) 50 MCG/ACT nasal spray Place 2 sprays into the nose daily.  16 g  3  . guaiFENesin-codeine (ROBITUSSIN AC) 100-10 MG/5ML syrup Take 10 mLs by mouth 4 (four) times daily as needed for cough.  180 mL  0  . HYDROcodone-acetaminophen (NORCO/VICODIN) 5-325 MG per tablet Take 1 tablet by mouth every 6 (six) hours as needed for pain.  15 tablet  0  . levofloxacin  (LEVAQUIN) 500 MG tablet Take 1 tablet (500 mg total) by mouth daily.  7 tablet  0  . lisinopril-hydrochlorothiazide (PRINZIDE,ZESTORETIC) 20-25 MG per tablet TAKE 1 TABLET BY MOUTH DAILY  30 tablet  PRN  . loratadine (CLARITIN) 10 MG tablet Take 1 tablet (10 mg total) by mouth daily.  30 tablet  3  . metFORMIN (GLUCOPHAGE) 500 MG tablet TAKE 1 TABLET BY MOUTH TWICE DAILY WITH MEAL  60 tablet  PRN  . pravastatin (PRAVACHOL) 40 MG tablet TAKE 1 TABLET BY MOUTH DAILY  30 tablet  PRN  . predniSONE (DELTASONE) 10 MG tablet Take 10 mg by mouth daily.      . predniSONE (DELTASONE) 20 MG tablet Take 3 tablets (60 mg total) by mouth daily.  12 tablet  0  . SYMBICORT 80-4.5 MCG/ACT inhaler INHALE 2 PUFFS BY MOUTH TWICE DAILY  1 Inhaler  PRN  . tiotropium (SPIRIVA) 18 MCG inhalation capsule Place 18 mcg into inhaler and inhale daily.      Marland Kitchen triamcinolone cream (KENALOG) 0.1 % Apply topically 2 (two) times daily. Apply for 2 weeks. May use on face  30 g  0  . [DISCONTINUED] albuterol (PROVENTIL,VENTOLIN) 90 MCG/ACT inhaler Inhale 2 puffs into the lungs every 6 (six) hours as needed for wheezing.  17 g  12    History   Social History  . Marital Status: Married    Spouse Name: N/A    Number of Children: N/A  . Years of Education: N/A   Occupational History  . Not on file.   Social History Main Topics  . Smoking status: Current Some Day Smoker -- 0.3 packs/day for 40 years    Types: Cigarettes  . Smokeless tobacco: Never Used     Comment: given QUIT line info - husband states he will quit when she does  . Alcohol Use: 2.4 oz/week    4 Cans of beer per week     Comment: "only on the weekends"  . Drug Use: No  . Sexually Active: Yes   Other Topics Concern  . Not on file   Social History Narrative   Lives in [place] with her husband.Takes care of 3 grand children.Trying to find a job, has financial difficulties.    family history includes Cancer in her mother.  Physical Exam Blood  pressure 104/70, pulse 92, temperature 98.6 F (37 C), temperature source Oral, height 5\' 4"  (1.626 m), weight 217 lb 4.8 oz (98.567 kg), SpO2 98.00%. General:  No acute distress, alert and oriented x 3 HEENT:  PERRL, EOMI, no lymphadenopathy, moist mucous membranes Cardiovascular:  Regular rate and rhythm, no murmurs, rubs or gallops Respiratory:  Clear to auscultation bilaterally,  no wheezes, rales, or rhonchi Breast: Multiple well-healed scars noted over right breast and axilla. There is dense scar tissue deep to the scars making it difficult to palpate any masses. Palpable node in right axilla. Left breast without any palpable masses. Patient unable to lift left shoulder for proper axilla examination. Abdomen:  Soft, nondistended, nontender, normoactive bowel sounds Extremities:  Warm and well-perfused, no clubbing, cyanosis, or edema.  Skin: Warm, dry, no rashes Neuro: Intermittently tearful, anxious appearing  Labs: Lab Results  Component Value Date   CREATININE 0.72 11/04/2012   BUN 11 11/04/2012   NA 133* 11/04/2012   K 4.0 11/04/2012   CL 94* 11/04/2012   CO2 27 11/04/2012   Lab Results  Component Value Date   WBC 11.8* 11/04/2012   HGB 11.6* 11/04/2012   HCT 36.9 11/04/2012   MCV 87.2 11/04/2012   PLT 359 11/04/2012      Assessment and Plan:    FOLLOWUP: [name] will follow back up in our clinic in approximately 2-4 weeks. [name] knows to call out clinic in the meantime with any questions or new issues.

## 2012-12-01 NOTE — Assessment & Plan Note (Addendum)
Lab Results  Component Value Date   HGBA1C 7.8 12/01/2012   HGBA1C 7.6* 02/21/2012   HGBA1C 7.4* 10/05/2011     Assessment:  Diabetes control:   uncontrolled  Progress toward A1C goal:    deteriorated  Comments:   Plan:  Patient has been on 500 mg twice a day of metformin. We do have room to go up on this. We will increase the dose to 1000 mg twice a day. Patient will return to clinic in 2-4 weeks for followup.  -Will check urine microalbumin/creatinine today

## 2012-12-01 NOTE — Patient Instructions (Addendum)
Please go to get x-rays of your left shoulder.  Go to the imaging center for your mammogram  Please pick up the following medications from her pharmacy: Ambien for sleep, metformin for diabetes  Please return to clinic in 2-3 weeks

## 2012-12-01 NOTE — Assessment & Plan Note (Signed)
Patient has a history of breast cancer status post lumpectomy with chemotherapy and radiation therapy in 1991. Patient's last mammogram was in 2011 and was unremarkable. She had a chest CT in December 2013 during an ED visit for shortness of breath which showed a spiculated nodule with associated lymph node.  -Diagnostic mammogram on the right, may need ultrasound to followup -Screening mammogram on the left -Patient requests referral to Dr. Yolanda Bonine at [facility]

## 2012-12-01 NOTE — Assessment & Plan Note (Signed)
>>  ASSESSMENT AND PLAN FOR PERSONAL HISTORY OF BREAST CANCER WRITTEN ON 12/01/2012 12:07 PM BY Larey Seat, MD  Patient has a history of breast cancer status post lumpectomy with chemotherapy and radiation therapy in 1991. Patient's last mammogram was in 2011 and was unremarkable. She had a chest CT in December 2013 during an ED visit for shortness of breath which showed a spiculated nodule with associated lymph node.  -Diagnostic mammogram on the right, may need ultrasound to followup -Screening mammogram on the left -Patient requests referral to Dr. Yolanda Bonine at [facility]

## 2012-12-01 NOTE — Assessment & Plan Note (Addendum)
Patient presents with difficulty sleeping over the past 3-4 weeks after receiving news of breast nodule on chest CT. She is extremely worried about this and stays up late at night unable to sleep because of racing thoughts. She says that her days and nights are all switched up and she has daytime somnolence affecting her daily life.  I think she would benefit from a short-term course of Ambien to get her through this difficult time where she gets worked up for her breast nodule.  -Ambien 5 mg each bedtime when necessary #15 given, no refills. Patient was unable to afford 30 pills, so after calling numerous pharmacies, we were able to get her 15 tablets with the [facility] outpatient pharmacy for $4. This was the only prescription for Ambien given to the patient so she only has a 15 day supply -Ok to refill if patient can afford and is still not sleeping well

## 2012-12-02 LAB — MICROALBUMIN / CREATININE URINE RATIO: Microalb Creat Ratio: 7.6 mg/g (ref 0.0–30.0)

## 2012-12-04 DIAGNOSIS — Z853 Personal history of malignant neoplasm of breast: Secondary | ICD-10-CM | POA: Diagnosis not present

## 2012-12-18 ENCOUNTER — Other Ambulatory Visit: Payer: Self-pay | Admitting: Internal Medicine

## 2012-12-20 ENCOUNTER — Encounter (HOSPITAL_COMMUNITY): Payer: Self-pay | Admitting: General Practice

## 2012-12-20 ENCOUNTER — Observation Stay (HOSPITAL_COMMUNITY): Payer: Medicare Other

## 2012-12-20 ENCOUNTER — Telehealth: Payer: Self-pay | Admitting: *Deleted

## 2012-12-20 ENCOUNTER — Ambulatory Visit (INDEPENDENT_AMBULATORY_CARE_PROVIDER_SITE_OTHER): Payer: Medicare Other | Admitting: Internal Medicine

## 2012-12-20 ENCOUNTER — Observation Stay (HOSPITAL_COMMUNITY)
Admission: AD | Admit: 2012-12-20 | Discharge: 2012-12-22 | Disposition: A | Discharge status: Home / Self Care | Payer: Medicare Other | Source: Ambulatory Visit | Attending: Infectious Disease | Admitting: Infectious Disease

## 2012-12-20 ENCOUNTER — Encounter: Payer: Self-pay | Admitting: Internal Medicine

## 2012-12-20 ENCOUNTER — Other Ambulatory Visit: Payer: Self-pay | Admitting: Internal Medicine

## 2012-12-20 VITALS — BP 106/64 | HR 105 | Temp 101.5°F | Ht 64.0 in | Wt 216.0 lb

## 2012-12-20 DIAGNOSIS — J329 Chronic sinusitis, unspecified: Secondary | ICD-10-CM

## 2012-12-20 DIAGNOSIS — M25512 Pain in left shoulder: Secondary | ICD-10-CM

## 2012-12-20 DIAGNOSIS — Z853 Personal history of malignant neoplasm of breast: Secondary | ICD-10-CM | POA: Insufficient documentation

## 2012-12-20 DIAGNOSIS — R928 Other abnormal and inconclusive findings on diagnostic imaging of breast: Secondary | ICD-10-CM | POA: Diagnosis not present

## 2012-12-20 DIAGNOSIS — M25519 Pain in unspecified shoulder: Secondary | ICD-10-CM | POA: Diagnosis not present

## 2012-12-20 DIAGNOSIS — F17200 Nicotine dependence, unspecified, uncomplicated: Secondary | ICD-10-CM | POA: Diagnosis present

## 2012-12-20 DIAGNOSIS — J440 Chronic obstructive pulmonary disease with acute lower respiratory infection: Secondary | ICD-10-CM | POA: Diagnosis not present

## 2012-12-20 DIAGNOSIS — D72829 Elevated white blood cell count, unspecified: Secondary | ICD-10-CM | POA: Diagnosis present

## 2012-12-20 DIAGNOSIS — R6889 Other general symptoms and signs: Secondary | ICD-10-CM | POA: Insufficient documentation

## 2012-12-20 DIAGNOSIS — I10 Essential (primary) hypertension: Secondary | ICD-10-CM | POA: Diagnosis not present

## 2012-12-20 DIAGNOSIS — E119 Type 2 diabetes mellitus without complications: Secondary | ICD-10-CM | POA: Diagnosis present

## 2012-12-20 DIAGNOSIS — K219 Gastro-esophageal reflux disease without esophagitis: Secondary | ICD-10-CM

## 2012-12-20 DIAGNOSIS — E876 Hypokalemia: Secondary | ICD-10-CM | POA: Diagnosis not present

## 2012-12-20 DIAGNOSIS — J449 Chronic obstructive pulmonary disease, unspecified: Secondary | ICD-10-CM

## 2012-12-20 DIAGNOSIS — J410 Simple chronic bronchitis: Secondary | ICD-10-CM | POA: Diagnosis not present

## 2012-12-20 DIAGNOSIS — J111 Influenza due to unidentified influenza virus with other respiratory manifestations: Secondary | ICD-10-CM | POA: Diagnosis not present

## 2012-12-20 DIAGNOSIS — E1122 Type 2 diabetes mellitus with diabetic chronic kidney disease: Secondary | ICD-10-CM | POA: Diagnosis present

## 2012-12-20 DIAGNOSIS — F3289 Other specified depressive episodes: Secondary | ICD-10-CM | POA: Insufficient documentation

## 2012-12-20 DIAGNOSIS — J020 Streptococcal pharyngitis: Secondary | ICD-10-CM | POA: Diagnosis not present

## 2012-12-20 DIAGNOSIS — R071 Chest pain on breathing: Secondary | ICD-10-CM | POA: Diagnosis not present

## 2012-12-20 DIAGNOSIS — F329 Major depressive disorder, single episode, unspecified: Secondary | ICD-10-CM | POA: Diagnosis not present

## 2012-12-20 DIAGNOSIS — G4700 Insomnia, unspecified: Secondary | ICD-10-CM | POA: Diagnosis not present

## 2012-12-20 DIAGNOSIS — Z299 Encounter for prophylactic measures, unspecified: Secondary | ICD-10-CM | POA: Diagnosis not present

## 2012-12-20 DIAGNOSIS — Z720 Tobacco use: Secondary | ICD-10-CM | POA: Diagnosis present

## 2012-12-20 DIAGNOSIS — J209 Acute bronchitis, unspecified: Principal | ICD-10-CM | POA: Insufficient documentation

## 2012-12-20 DIAGNOSIS — J441 Chronic obstructive pulmonary disease with (acute) exacerbation: Secondary | ICD-10-CM | POA: Diagnosis present

## 2012-12-20 LAB — CBC WITH DIFFERENTIAL/PLATELET
Basophils Absolute: 0 10*3/uL (ref 0.0–0.1)
Basophils Relative: 0 % (ref 0–1)
Eosinophils Relative: 0 % (ref 0–5)
Lymphocytes Relative: 14 % (ref 12–46)
Neutro Abs: 11.9 10*3/uL — ABNORMAL HIGH (ref 1.7–7.7)
Platelets: 314 10*3/uL (ref 150–400)
RDW: 13.1 % (ref 11.5–15.5)
WBC: 14.9 10*3/uL — ABNORMAL HIGH (ref 4.0–10.5)

## 2012-12-20 LAB — CBC
MCHC: 33.6 g/dL (ref 30.0–36.0)
Platelets: 268 10*3/uL (ref 150–400)
RDW: 13.1 % (ref 11.5–15.5)
WBC: 12.1 10*3/uL — ABNORMAL HIGH (ref 4.0–10.5)

## 2012-12-20 LAB — BASIC METABOLIC PANEL
BUN: 10 mg/dL (ref 6–23)
Creatinine, Ser: 0.74 mg/dL (ref 0.50–1.10)
GFR calc Af Amer: >90 mL/min (ref >90)
GFR calc non Af Amer: 90 mL/min — ABNORMAL LOW (ref >90)

## 2012-12-20 LAB — COMPLETE METABOLIC PANEL WITH GFR
Albumin: 3.5 g/dL (ref 3.5–5.2)
BUN: 8 mg/dL (ref 6–23)
CO2: 30 mEq/L (ref 19–32)
Calcium: 9.4 mg/dL (ref 8.4–10.5)
Chloride: 94 mEq/L — ABNORMAL LOW (ref 96–112)
GFR, Est Non African American: 89 mL/min
Glucose, Bld: 129 mg/dL — ABNORMAL HIGH (ref 70–99)
Potassium: 3 mEq/L — ABNORMAL LOW (ref 3.5–5.3)

## 2012-12-20 LAB — RAPID STREP SCREEN (MED CTR MEBANE ONLY): Streptococcus, Group A Screen (Direct): NEGATIVE

## 2012-12-20 LAB — GLUCOSE, CAPILLARY
Glucose-Capillary: 107 mg/dL — ABNORMAL HIGH (ref 70–99)
Glucose-Capillary: 194 mg/dL — ABNORMAL HIGH (ref 70–99)

## 2012-12-20 LAB — MRSA PCR SCREENING: MRSA by PCR: NEGATIVE

## 2012-12-20 LAB — MAGNESIUM: Magnesium: 1.7 mg/dL (ref 1.5–2.5)

## 2012-12-20 MED ORDER — INSULIN ASPART 100 UNIT/ML ~~LOC~~ SOLN
0.0000 [IU] | Freq: Three times a day (TID) | SUBCUTANEOUS | Status: DC
  Administered 2012-12-21 – 2012-12-22 (×3): 1 [IU] via SUBCUTANEOUS

## 2012-12-20 MED ORDER — ZOLPIDEM TARTRATE 5 MG PO TABS
5.0000 mg | ORAL_TABLET | Freq: Every evening | ORAL | Status: DC | PRN
  Administered 2012-12-20 – 2012-12-21 (×2): 5 mg via ORAL
  Filled 2012-12-20 (×2): qty 1

## 2012-12-20 MED ORDER — FLUTICASONE PROPIONATE 50 MCG/ACT NA SUSP
2.0000 | Freq: Every day | NASAL | Status: DC
  Administered 2012-12-20 – 2012-12-22 (×3): 2 via NASAL
  Filled 2012-12-20: qty 16

## 2012-12-20 MED ORDER — LISINOPRIL 20 MG PO TABS
20.0000 mg | ORAL_TABLET | Freq: Every day | ORAL | Status: DC
Start: 2012-12-20 — End: 2012-12-22
  Administered 2012-12-20 – 2012-12-22 (×3): 20 mg via ORAL
  Filled 2012-12-20 (×3): qty 1

## 2012-12-20 MED ORDER — BUDESONIDE-FORMOTEROL FUMARATE 80-4.5 MCG/ACT IN AERO
2.0000 | INHALATION_SPRAY | Freq: Two times a day (BID) | RESPIRATORY_TRACT | Status: DC
  Administered 2012-12-21 – 2012-12-22 (×3): 2 via RESPIRATORY_TRACT
  Filled 2012-12-20: qty 6.9

## 2012-12-20 MED ORDER — ALBUTEROL SULFATE HFA 108 (90 BASE) MCG/ACT IN AERS
2.0000 | INHALATION_SPRAY | RESPIRATORY_TRACT | Status: DC | PRN
  Filled 2012-12-20: qty 6.7

## 2012-12-20 MED ORDER — ENOXAPARIN SODIUM 40 MG/0.4ML ~~LOC~~ SOLN
40.0000 mg | SUBCUTANEOUS | Status: DC
  Administered 2012-12-20 – 2012-12-21 (×2): 40 mg via SUBCUTANEOUS
  Filled 2012-12-20 (×3): qty 0.4

## 2012-12-20 MED ORDER — LISINOPRIL-HYDROCHLOROTHIAZIDE 20-25 MG PO TABS: 1.0000 | ORAL_TABLET | Freq: Every day | ORAL | Status: DC

## 2012-12-20 MED ORDER — KETOROLAC TROMETHAMINE 30 MG/ML IJ SOLN
30.0000 mg | Freq: Once | INTRAMUSCULAR | Status: AC
  Administered 2012-12-20: 30 mg via INTRAMUSCULAR

## 2012-12-20 MED ORDER — ALBUTEROL SULFATE (5 MG/ML) 0.5% IN NEBU: 2.5000 mg | INHALATION_SOLUTION | RESPIRATORY_TRACT | Status: DC | PRN

## 2012-12-20 MED ORDER — LORATADINE 10 MG PO TABS
10.0000 mg | ORAL_TABLET | Freq: Every day | ORAL | Status: DC
  Administered 2012-12-20 – 2012-12-22 (×3): 10 mg via ORAL
  Filled 2012-12-20 (×3): qty 1

## 2012-12-20 MED ORDER — HYDROCHLOROTHIAZIDE 25 MG PO TABS
25.0000 mg | ORAL_TABLET | Freq: Every day | ORAL | Status: DC
  Administered 2012-12-20 – 2012-12-22 (×3): 25 mg via ORAL
  Filled 2012-12-20 (×3): qty 1

## 2012-12-20 MED ORDER — KETOROLAC TROMETHAMINE 30 MG/ML IM SOLN: 30.0000 mg | Freq: Once | INTRAMUSCULAR | Status: DC

## 2012-12-20 MED ORDER — POTASSIUM CHLORIDE CRYS ER 20 MEQ PO TBCR
40.0000 meq | EXTENDED_RELEASE_TABLET | ORAL | Status: AC
  Administered 2012-12-20 (×2): 40 meq via ORAL
  Filled 2012-12-20 (×2): qty 2

## 2012-12-20 MED ORDER — SODIUM CHLORIDE 0.9 % IV SOLN
INTRAVENOUS | Status: DC
  Administered 2012-12-20 – 2012-12-21 (×2): via INTRAVENOUS

## 2012-12-20 MED ORDER — MAGIC MOUTHWASH
5.0000 mL | ORAL | Status: DC
  Administered 2012-12-20 – 2012-12-22 (×10): 5 mL via ORAL
  Filled 2012-12-20 (×17): qty 5

## 2012-12-20 MED ORDER — CITALOPRAM HYDROBROMIDE 20 MG PO TABS
20.0000 mg | ORAL_TABLET | Freq: Every day | ORAL | Status: DC
  Administered 2012-12-21 – 2012-12-22 (×2): 20 mg via ORAL
  Filled 2012-12-20 (×2): qty 1

## 2012-12-20 MED ORDER — TRAMADOL HCL 50 MG PO TABS
50.0000 mg | ORAL_TABLET | Freq: Three times a day (TID) | ORAL | Status: DC | PRN
  Administered 2012-12-20 – 2012-12-21 (×2): 50 mg via ORAL
  Filled 2012-12-20 (×3): qty 1

## 2012-12-20 MED ORDER — SIMVASTATIN 20 MG PO TABS
20.0000 mg | ORAL_TABLET | Freq: Every day | ORAL | Status: DC
  Administered 2012-12-20 – 2012-12-21 (×2): 20 mg via ORAL
  Filled 2012-12-20 (×3): qty 1

## 2012-12-20 MED ORDER — SODIUM CHLORIDE 0.9 % IJ SOLN: 3.0000 mL | INTRAMUSCULAR | Status: DC | PRN

## 2012-12-20 MED ORDER — TIOTROPIUM BROMIDE MONOHYDRATE 18 MCG IN CAPS
18.0000 ug | ORAL_CAPSULE | Freq: Every day | RESPIRATORY_TRACT | Status: DC
  Administered 2012-12-21 – 2012-12-22 (×2): 18 ug via RESPIRATORY_TRACT
  Filled 2012-12-20: qty 5

## 2012-12-20 MED ORDER — SODIUM CHLORIDE 0.9 % IJ SOLN: 3.0000 mL | Freq: Two times a day (BID) | INTRAMUSCULAR | Status: DC

## 2012-12-20 MED ORDER — CARVEDILOL 6.25 MG PO TABS
6.2500 mg | ORAL_TABLET | Freq: Two times a day (BID) | ORAL | Status: DC
  Administered 2012-12-20 – 2012-12-22 (×4): 6.25 mg via ORAL
  Filled 2012-12-20 (×6): qty 1

## 2012-12-20 MED ORDER — SODIUM CHLORIDE 0.9 % IV SOLN: 250.0000 mL | INTRAVENOUS | Status: DC | PRN

## 2012-12-20 NOTE — Progress Notes (Signed)
Subjective:   [name]   DOB: 12/19/1950 62 y.o.   MRN: [id number]  HPI: 62 year old woman with past medical history significant for hypertension, hyperlipidemia, COPD,poorly controlled Type 2 DM presents to the clinic for flulike symptoms for 4 days.   Patient states that she has not been feeling well ever since she got her flu shot from the clinic on 12/01/2012. She reports that for the last 4 days her symptoms are really worse. She reports having sore throat associated with difficulty swallowing. She also reports having cough where she is bringing yellowish phlegm, denies noticing any blood associated with posttussive emesis. She also reports decreased appetite because of the fear of emesis and also given her difficulty swallowing. + myalgias,, + sweating., + subjective fevers and + chills . Denies any shortness of breath or wheezing or chest pain. She also reports that one of her grandson was sick from URI last week was getting better now.     Past Medical History  Diagnosis Date  . Hyperlipidemia   . Hypertension   . Tobacco abuse   . COPD (chronic obstructive pulmonary disease)     History of multiple hospital admissions for exercabation   . Asthma   . Breast cancer 1991    s/p lumpectomy, chemotherapy and radiation therapy in 1991. Mammogram in 2007 was normal.  . Sigmoid diverticulitis 80/2008  . Anxiety   . Depression   . Obesity   . GERD (gastroesophageal reflux disease)   . Heart murmur 10/05/11    "first time I ever heard I had one was today"  . Pneumonia   . Shortness of breath 10/05/11    "at rest; lying down; w/exertion"  . Diabetes mellitus   . Bronchitis     h/o  . Diarrhea     h/o  . Constipated     h/o   Family History  Problem Relation Age of Onset  . Cancer Mother    History   Social History  . Marital Status: Married    Spouse Name: N/A    Number of Children: N/A  . Years of Education: N/A   Occupational History  .  Not on file.   Social History Main Topics  . Smoking status: Current Some Day Smoker -- 0.30 packs/day for 40 years    Types: Cigarettes  . Smokeless tobacco: Never Used     Comment: given QUIT line info - husband states he will quit when she does  . Alcohol Use: 2.4 oz/week    4 Cans of beer per week     Comment: "only on the weekends"  . Drug Use: No  . Sexually Active: Not on file   Other Topics Concern  . Not on file   Social History Narrative   Lives in [place] with her husband.   Takes care of 3 grand children.   Trying to find a job, has financial difficulties.         Review of Systems: General: Denies  Diaphoresis, +  appetite change and fatigue,+  fever, + chills,. HEENT: Denies photophobia, eye pain, redness, hearing loss, + ear pain, + congestion, + sore throat, rhinorrhea, sneezing, mouth sores, + trouble swallowing, neck pain, neck stiffness and tinnitus. Respiratory: Denies SOB, DOE, cough, chest tightness, and wheezing. Cardiovascular: Denies to chest pain, palpitations and leg swelling. Gastrointestinal: Denies nausea, abdominal pain, diarrhea, constipation, blood in stool and abdominal distention,  + vomiting,. Genitourinary: Denies dysuria, urgency, frequency, hematuria, flank pain  and difficulty urinating. Musculoskeletal: Denies  back pain, joint swelling, arthralgias and gait problem, + myalgias,.  Skin: Denies pallor, rash and wound. Neurological: Denies dizziness, seizures, syncope, weakness, light-headedness, numbness and headaches. Hematological: Denies adenopathy, easy bruising, personal or family bleeding history. Psychiatric/Behavioral: Denies suicidal ideation, mood changes, confusion, nervousness, sleep disturbance and agitation.    Current Outpatient Medications: Current Outpatient Prescriptions  Medication Sig Dispense Refill  . albuterol (PROVENTIL HFA;VENTOLIN HFA) 108 (90 BASE) MCG/ACT inhaler Inhale 2 puffs into the lungs every 4 (four)  hours as needed for wheezing.  1 Inhaler  11  . albuterol (PROVENTIL) (2.5 MG/3ML) 0.083% nebulizer solution Take 3 mLs (2.5 mg total) by nebulization every 4 (four) hours as needed. For shortness of breath  75 mL  3  . carvedilol (COREG) 6.25 MG tablet Take 6.25 mg by mouth 2 (two) times daily with a meal.      . citalopram (CELEXA) 40 MG tablet Take 1 tablet (40 mg total) by mouth daily.  30 tablet  3  . citalopram (CELEXA) 40 MG tablet Take 20 mg by mouth daily.      . citalopram (CELEXA) 40 MG tablet TAKE 1/2 TABLET BY MOUTH EVERY DAY  15 tablet  PRN  . fluticasone (FLONASE) 50 MCG/ACT nasal spray Place 2 sprays into the nose daily.  16 g  3  . ketorolac (TORADOL) 30 MG/ML injection Inject 1 mL (30 mg total) into the muscle once.  1 mL  0  . lisinopril-hydrochlorothiazide (PRINZIDE,ZESTORETIC) 20-25 MG per tablet TAKE 1 TABLET BY MOUTH DAILY  30 tablet  PRN  . loratadine (CLARITIN) 10 MG tablet Take 1 tablet (10 mg total) by mouth daily.  30 tablet  3  . metFORMIN (GLUCOPHAGE) 1000 MG tablet Take 1 tablet (1,000 mg total) by mouth 2 (two) times daily with a meal.  60 tablet  PRN  . pravastatin (PRAVACHOL) 40 MG tablet TAKE 1 TABLET BY MOUTH DAILY  30 tablet  PRN  . SYMBICORT 80-4.5 MCG/ACT inhaler INHALE 2 PUFFS BY MOUTH TWICE DAILY  1 Inhaler  PRN  . tiotropium (SPIRIVA) 18 MCG inhalation capsule Place 18 mcg into inhaler and inhale daily.      . traMADol (ULTRAM) 50 MG tablet Take 1 tablet (50 mg total) by mouth every 8 (eight) hours as needed for pain.  40 tablet  0  . zolpidem (AMBIEN) 5 MG tablet Take 1 tablet (5 mg total) by mouth at bedtime as needed for sleep.  15 tablet  0  . [DISCONTINUED] albuterol (PROVENTIL,VENTOLIN) 90 MCG/ACT inhaler Inhale 2 puffs into the lungs every 6 (six) hours as needed for wheezing.  17 g  12   No current facility-administered medications for this visit.    Allergies: No Known Allergies    Objective:   Physical Exam: Filed Vitals:   12/20/12  1027  BP: 106/64  Pulse: 105  Temp: 101.5 F (38.6 C)    General: Vital signs reviewed and noted. Well-developed, well-nourished, in no acute distress; alert, appropriate and cooperative throughout examination. HEENT: Normocephalic, atraumatic, pharyngeal erythema noted but no exudates Lungs: Normal respiratory effort. Clear to auscultation BL without crackles or wheezes. Heart: RRR. S1 and S2 normal without gallop, murmur, or rubs. Abdomen:BS normoactive. Soft, Nondistended, non-tender.  No masses or organomegaly. Extremities: No pretibial edema.     Assessment & Plan:

## 2012-12-20 NOTE — H&P (Signed)
Hospital Admission Note Date: 12/20/2012  Patient name: [name] Medical record number: [id number] Date of birth: 10-Mar-1951 Age: 62 y.o. Gender: female PCP: Elyse Jarvis, MD  Medical Service: Internal Medicine Teaching Service--[facility]  Attending physician: Dr. Daiva Eves    1st Contact: Dr. Cicero Duck 2nd Contact:  Dr. Dierdre Searles     [id number] After 5 pm or weekends: 1st Contact:      Pager: [phone] 2nd Contact:      Pager: [phone]  Chief Complaint: flu-like symptoms  History of Present Illness: [name] is a 62 year old African American female with PMH of HL, HTN, DM 2, COPD, and breast cancer presented to clinic today for 5 days of worsening flu-like symptoms.  She claims she has been feeling bad progressively over the past 5 days.  She says her symptoms started with sore throat and then progressed to body aches, b/l ear pain, productive cough with yellow-green sputum, and fever and chills.  She is noted to have a fever in clinic today of 101.5 F.  She endorses difficulty swallowing and headaches, nausea and vomiting this morning.  She also had diarrhea last week for two days that has since then resolved.  She currently denies any chest pain, shortness of breath, abdominal pain, or any urinary complaints at this time.  Per clinic resident, she endorses her grandson as a sick contact with a URI last week who is now improving.    Of note, She did get the flu shot this season.  She was also seen in the ED in December for COPD exacerbation and bronchitis, was on levaquin and continued on levaquin, albuterol, and prednisone taper at that time.    Meds: Current Outpatient Rx  Name  Route  Sig  Dispense  Refill  . albuterol (PROVENTIL HFA;VENTOLIN HFA) 108 (90 BASE) MCG/ACT inhaler   Inhalation   Inhale 2 puffs into the lungs every 4 (four) hours as needed for wheezing.   1 Inhaler   11   . albuterol (PROVENTIL) (2.5 MG/3ML) 0.083% nebulizer solution   Nebulization    Take 3 mLs (2.5 mg total) by nebulization every 4 (four) hours as needed. For shortness of breath   75 mL   3   . carvedilol (COREG) 6.25 MG tablet   Oral   Take 6.25 mg by mouth 2 (two) times daily with a meal.         . EXPIRED: citalopram (CELEXA) 40 MG tablet   Oral   Take 1 tablet (40 mg total) by mouth daily.   30 tablet   3   . citalopram (CELEXA) 40 MG tablet   Oral   Take 20 mg by mouth daily.         . citalopram (CELEXA) 40 MG tablet      TAKE 1/2 TABLET BY MOUTH EVERY DAY   15 tablet   PRN   . fluticasone (FLONASE) 50 MCG/ACT nasal spray   Nasal   Place 2 sprays into the nose daily.   16 g   3   . ketorolac (TORADOL) 30 MG/ML injection   Intramuscular   Inject 1 mL (30 mg total) into the muscle once.   1 mL   0   . lisinopril-hydrochlorothiazide (PRINZIDE,ZESTORETIC) 20-25 MG per tablet      TAKE 1 TABLET BY MOUTH DAILY   30 tablet   PRN   . EXPIRED: loratadine (CLARITIN) 10 MG tablet   Oral   Take 1 tablet (  10 mg total) by mouth daily.   30 tablet   3   . metFORMIN (GLUCOPHAGE) 1000 MG tablet   Oral   Take 1 tablet (1,000 mg total) by mouth 2 (two) times daily with a meal.   60 tablet   PRN   . pravastatin (PRAVACHOL) 40 MG tablet      TAKE 1 TABLET BY MOUTH DAILY   30 tablet   PRN   . SYMBICORT 80-4.5 MCG/ACT inhaler      INHALE 2 PUFFS BY MOUTH TWICE DAILY   1 Inhaler   PRN   . tiotropium (SPIRIVA) 18 MCG inhalation capsule   Inhalation   Place 18 mcg into inhaler and inhale daily.         . traMADol (ULTRAM) 50 MG tablet   Oral   Take 1 tablet (50 mg total) by mouth every 8 (eight) hours as needed for pain.   40 tablet   0   . zolpidem (AMBIEN) 5 MG tablet   Oral   Take 1 tablet (5 mg total) by mouth at bedtime as needed for sleep.   15 tablet   0    Allergies: Allergies as of 12/20/2012  . (No Known Allergies)   Past Medical History  Diagnosis Date  . Hyperlipidemia   . Hypertension   . Tobacco abuse    . COPD (chronic obstructive pulmonary disease)     History of multiple hospital admissions for exercabation   . Asthma   . Breast cancer 1991    s/p lumpectomy, chemotherapy and radiation therapy in 1991. Mammogram in 2007 was normal.  . Sigmoid diverticulitis 80/2008  . Anxiety   . Depression   . Obesity   . GERD (gastroesophageal reflux disease)   . Heart murmur 10/05/11    "first time I ever heard I had one was today"  . Pneumonia   . Shortness of breath 10/05/11    "at rest; lying down; w/exertion"  . Diabetes mellitus   . Bronchitis     h/o  . Diarrhea     h/o  . Constipated     h/o   Past Surgical History  Procedure Laterality Date  . Dobutamine stress echo  08/2004    Inferior ischemia, normal LV systolic function, no significant CAD  . Abdominal hysterectomy    . Breast surgery  1991    lumphectomy right breast  . Neck surgery  2012    "Dr. [name]  put plate in; did something to my vertebrae"  . Cardiac catheterization      left cardiac cath 08/2004 normal systolic function, no CAD   Family History  Problem Relation Age of Onset  . Cancer Mother    History   Social History  . Marital Status: Married    Spouse Name: N/A    Number of Children: N/A  . Years of Education: N/A   Occupational History  . Not on file.   Social History Main Topics  . Smoking status: Current Some Day Smoker -- 0.30 packs/day for 40 years    Types: Cigarettes  . Smokeless tobacco: Never Used     Comment: given QUIT line info - husband states he will quit when she does  . Alcohol Use: 2.4 oz/week    4 Cans of beer per week     Comment: "only on the weekends"  . Drug Use: No  . Sexually Active: Not on file   Other Topics Concern  . Not on file  Social History Narrative   Lives in [place] with her husband.   Takes care of 3 grand children.   Trying to find a job, has financial difficulties.         Review of Systems: Pertinent items are noted in HPI.  Physical  Exam: Vitals reviewed. BP 106/64, HR 105, Temp 101.29F General: resting in bed, NAD HEENT: PERRLA, EOMI, no scleral icterus, pharyngeal erythema and left inner ear erythema and irritation.  +thrush Cardiac: Tachycardia, +SEM 2/6 loudest at LUSB Pulm: clear to auscultation bilaterally, no wheezes, rales, or rhonchi Abd: soft, nontender, nondistended, BS present Ext: warm and well perfused, no pedal edema, +2 dp b/l Neuro: alert and oriented X3, cranial nerves II-XII grossly intact, strength and sensation to light touch equal in bilateral upper and lower extremities   Lab results: Basic Metabolic Panel:  Recent Labs  16/10/96 1110  NA 138  K 3.0*  CL 94*  CO2 30  GLUCOSE 129*  BUN 8  CREATININE 0.73  CALCIUM 9.4   Liver Function Tests:  Recent Labs  12/20/12 1110  AST 9  ALT 5  ALKPHOS 103  BILITOT 0.5  PROT 7.6  ALBUMIN 3.5   CBC:  Recent Labs  12/20/12 1110  WBC 14.9*  NEUTROABS 11.9*  HGB 12.0  HCT 35.6*  MCV 86.8  PLT 314   CBG:  Recent Labs  12/20/12 1040  GLUCAP 131*   Urine Drug Screen: Drugs of Abuse     Component Value Date/Time   LABOPIA NONE DETECTED 12/29/2009 0633   COCAINSCRNUR NONE DETECTED 12/29/2009 0633   LABBENZ NONE DETECTED 12/29/2009 0633   AMPHETMU NONE DETECTED 12/29/2009 0633   THCU NONE DETECTED 12/29/2009 0633   LABBARB  Value: NONE DETECTED        DRUG SCREEN FOR MEDICAL PURPOSES ONLY.  IF CONFIRMATION IS NEEDED FOR ANY PURPOSE, NOTIFY LAB WITHIN 5 DAYS.        LOWEST DETECTABLE LIMITS FOR URINE DRUG SCREEN Drug Class       Cutoff (ng/mL) Amphetamine      1000 Barbiturate      200 Benzodiazepine   200 Tricyclics       300 Opiates          300 Cocaine          300 THC              50 12/29/2009 0633    Assessment & Plan by Problem: [name] is a 62 year old African American female with PMH of HL, HTN, COPD, DM 2, and breast cancer admitted for flu-like symptoms x5 days.      Flu-like symptoms--presented with 5 days of  worsening symptoms including productive cough with yellow-green sputum, fever t max 101.29F on admission, malaise, body aches, and fatigue.  She does endorse getting the flu vaccine this season.  WBC 14.9 on admission.   -admit to med surg -tele monitoring -droplet precautions -influenza pcr -CXR--if evidence of PNA, will need to start antibiotics for CAP -sputum cx -AM LABS: CBC, BMET -IVF--ns@100cc /hr -pain control--Tramadol PRN -monitor temp -f/u rapid strep screen    DIABETES MELLITUS, TYPE II--Hb A1C 7.8 (12/01/12).  On home regimen metformin 1000mg  BID. -CBG monitoring -SSI Sensitive    TOBACCO ABUSE--still smoking approximately 4-5 cigs/day -smoking cessation advised and counseling given    Essential hypertension, benign--BP on admission 106/64.  On home regimen Coreg 6.25mg  BID and Prinzide 20-25mg  qd.   -continue home medications -continue to monitor BP  COPD--no evidence of exacerbation at this time.  Recently treated in December 2013 with levaquin and prednisone taper.  Denies shortness of breath at this time but does have productive cough.  o2 sat 99% on room air on admission.  On home regimen: albuterol inhaler, albuterol nebz, symbicort, and spiriva -appears stable at this time -smoking cessation advised -continue home meds    Hypokalemia--K 3.0 on admission.  Likely secondary to decreased PO intake in setting of illness.   -replaced -f/u AM bmet -continue to monitor  -f/u EKG    Leukocytosis, unspecified--WBC 14.9 on admission with 11.9 neutrophils.  Tmax on admission 101.80F.  Possibly secondary to viral illness ?influenza vs ?pna -f/u cxr, if evidence of PNA, begin abx treatment for CAP -f/u sputum cx -trend wbc   Depression--appears stable at this time. On home Celexa. No suicidal or homicidal ideation.   -continue celexa   Dispo: Admitted for observation.  Disposition is deferred at this time, awaiting improvement of current medical problems. Anticipated  discharge in approximately 1 day(s).   The patient does have a current PCP (SAWHNEY,MEGHA, MD), therefore will be requiring [facility]PC follow-up after discharge.   The patient does not have transportation limitations that hinder transportation to clinic appointments.  SignedDarden Palmer 12/20/2012, 3:31 PM

## 2012-12-20 NOTE — Telephone Encounter (Signed)
Patient seen in the clinic

## 2012-12-20 NOTE — Assessment & Plan Note (Signed)
She reports having flulike symptoms ever since a flu shot but got worse for last 4 days. On exam today she is tachycardic with temp of 101.5. She was also noted to have pharyngeal erythema. Given her comorbidities of COPD and poorly controlled diabetes and inability to keep down anything, she would benefit from 24-hour observation in the hospital. Differentials include bronchitis versus flu versus strep sore throat. Atypical PNA is also a possibility. -Admit to the hospital for further evaluation. -Would get a chest x-ray. -Followup on rapid strep test collected from the clinic -Followup on CBC and CMET -May consider empirically starting her on Tamiflu. -IV fluids

## 2012-12-20 NOTE — Telephone Encounter (Signed)
Pt calls c/o since she rec'd the flu injection she has been sick on and off, this bout has been going on for 3+ days, hot/ cold episodes, N&V, generalized body aches, congestion, h/a. She thinks it is r/t to the flu injection. appt set w/ her pcp for 1045, dr [name]

## 2012-12-21 DIAGNOSIS — M25519 Pain in unspecified shoulder: Secondary | ICD-10-CM | POA: Diagnosis not present

## 2012-12-21 DIAGNOSIS — K219 Gastro-esophageal reflux disease without esophagitis: Secondary | ICD-10-CM

## 2012-12-21 DIAGNOSIS — F17200 Nicotine dependence, unspecified, uncomplicated: Secondary | ICD-10-CM

## 2012-12-21 DIAGNOSIS — I10 Essential (primary) hypertension: Secondary | ICD-10-CM | POA: Diagnosis not present

## 2012-12-21 DIAGNOSIS — J449 Chronic obstructive pulmonary disease, unspecified: Secondary | ICD-10-CM

## 2012-12-21 DIAGNOSIS — J329 Chronic sinusitis, unspecified: Secondary | ICD-10-CM

## 2012-12-21 DIAGNOSIS — J440 Chronic obstructive pulmonary disease with acute lower respiratory infection: Secondary | ICD-10-CM | POA: Diagnosis present

## 2012-12-21 DIAGNOSIS — J209 Acute bronchitis, unspecified: Secondary | ICD-10-CM | POA: Diagnosis present

## 2012-12-21 LAB — GLUCOSE, CAPILLARY: Glucose-Capillary: 105 mg/dL — ABNORMAL HIGH (ref 70–99)

## 2012-12-21 LAB — HIV ANTIBODY (ROUTINE TESTING W REFLEX): HIV: NONREACTIVE

## 2012-12-21 LAB — INFLUENZA PANEL BY PCR (TYPE A & B)
H1N1 flu by pcr: NOT DETECTED
Influenza A By PCR: NEGATIVE

## 2012-12-21 LAB — BASIC METABOLIC PANEL
CO2: 29 mEq/L (ref 19–32)
Chloride: 96 mEq/L (ref 96–112)
GFR calc non Af Amer: >90 mL/min (ref >90)
Glucose, Bld: 152 mg/dL — ABNORMAL HIGH (ref 70–99)
Potassium: 3 mEq/L — ABNORMAL LOW (ref 3.5–5.1)
Sodium: 136 mEq/L (ref 135–145)

## 2012-12-21 LAB — CBC
HCT: 31.7 % — ABNORMAL LOW (ref 36.0–46.0)
Hemoglobin: 10.7 g/dL — ABNORMAL LOW (ref 12.0–15.0)
MCH: 29.2 pg (ref 26.0–34.0)
RBC: 3.66 MIL/uL — ABNORMAL LOW (ref 3.87–5.11)

## 2012-12-21 MED ORDER — DOXYCYCLINE HYCLATE 100 MG PO TABS
100.0000 mg | ORAL_TABLET | Freq: Two times a day (BID) | ORAL | Status: DC
  Filled 2012-12-21 (×2): qty 1

## 2012-12-21 MED ORDER — GUAIFENESIN 100 MG/5ML PO SOLN
5.0000 mL | ORAL | Status: DC | PRN
  Filled 2012-12-21: qty 5

## 2012-12-21 MED ORDER — PREDNISONE 20 MG PO TABS: ORAL_TABLET | ORAL | Status: DC

## 2012-12-21 MED ORDER — AMOXICILLIN-POT CLAVULANATE 875-125 MG PO TABS
1.0000 | ORAL_TABLET | Freq: Two times a day (BID) | ORAL | Status: DC
  Administered 2012-12-21 – 2012-12-22 (×3): 1 via ORAL
  Filled 2012-12-21 (×4): qty 1

## 2012-12-21 MED ORDER — ALBUTEROL SULFATE (5 MG/ML) 0.5% IN NEBU
5.0000 mg | INHALATION_SOLUTION | Freq: Once | RESPIRATORY_TRACT | Status: AC
  Administered 2012-12-21: 5 mg via RESPIRATORY_TRACT
  Filled 2012-12-21: qty 0.5

## 2012-12-21 MED ORDER — PREDNISONE 50 MG PO TABS
60.0000 mg | ORAL_TABLET | Freq: Every day | ORAL | Status: DC
  Administered 2012-12-22: 60 mg via ORAL
  Filled 2012-12-21 (×2): qty 1

## 2012-12-21 MED ORDER — BENZONATATE 100 MG PO CAPS
100.0000 mg | ORAL_CAPSULE | Freq: Three times a day (TID) | ORAL | Status: DC
  Administered 2012-12-21 – 2012-12-22 (×4): 100 mg via ORAL
  Filled 2012-12-21 (×6): qty 1

## 2012-12-21 MED ORDER — POTASSIUM CHLORIDE CRYS ER 20 MEQ PO TBCR
40.0000 meq | EXTENDED_RELEASE_TABLET | ORAL | Status: AC
  Administered 2012-12-21 (×2): 40 meq via ORAL
  Filled 2012-12-21 (×2): qty 2

## 2012-12-21 MED ORDER — IPRATROPIUM BROMIDE 0.02 % IN SOLN
0.5000 mg | Freq: Once | RESPIRATORY_TRACT | Status: AC
  Administered 2012-12-21: 0.5 mg via RESPIRATORY_TRACT
  Filled 2012-12-21: qty 2.5

## 2012-12-21 MED ORDER — SODIUM CHLORIDE 0.9 % IV SOLN
INTRAVENOUS | Status: AC
  Administered 2012-12-21 (×2): via INTRAVENOUS

## 2012-12-21 MED ORDER — HYDROCODONE-ACETAMINOPHEN 5-325 MG PO TABS
1.0000 | ORAL_TABLET | ORAL | Status: DC | PRN
  Administered 2012-12-21 – 2012-12-22 (×3): 2 via ORAL
  Filled 2012-12-21 (×3): qty 2

## 2012-12-21 MED ORDER — GUAIFENESIN ER 600 MG PO TB12
600.0000 mg | ORAL_TABLET | Freq: Two times a day (BID) | ORAL | Status: DC
  Administered 2012-12-21 – 2012-12-22 (×3): 600 mg via ORAL
  Filled 2012-12-21 (×4): qty 1

## 2012-12-21 MED ORDER — OSELTAMIVIR PHOSPHATE 75 MG PO CAPS
75.0000 mg | ORAL_CAPSULE | Freq: Two times a day (BID) | ORAL | Status: DC
  Filled 2012-12-21 (×2): qty 1

## 2012-12-21 NOTE — Progress Notes (Signed)
UR COMPLETED  

## 2012-12-21 NOTE — Progress Notes (Signed)
Patient resting PO2 without oxygen was 98 % HR 86.  Patient was ambulated in hallway and maintained a PO2 above 96%

## 2012-12-21 NOTE — H&P (Signed)
Internal Medicine Teaching Service Attending Note Date: 12/21/2012  Patient name: [name]  Medical record number: [id number]  Date of birth: Sep 03, 1951    This patient has been seen and discussed with the house staff. Please see their note for complete details. I concur with their findings with the following additions/corrections:  Patient with sinus congestion facial pain dear pain and fevers for nearly [redacted] weeks along with productive cough and underlying COPD.  On exam today she still has expiratory wheezes after nebulizer treatment.  I feel that she should be treated with augmentin 875/125 bid for 10 days for sinusitis  Agree with steroids, SVN  She will need continued fluconazole while receivign abx and steroids.   Acey Lav 12/21/2012, 12:49 PM

## 2012-12-21 NOTE — Progress Notes (Addendum)
Subjective: [name] was seen and examined at bedside.  She claims to be feeling a little better than yesterday and was able to tolerate diet and actually had a good appetite.  She continues to have a cough with occasional sputum and shortness of breath when she coughs a lot.  She was afebrile overnight and she also feels like she has sinus pressure.  She denies any N/V/D, chills, abdominal pain, or any urinary complaints at this time.    Objective: Vital signs in last 24 hours: Filed Vitals:   12/21/12 0500 12/21/12 0611 12/21/12 0817 12/21/12 1115  BP:  127/61    Pulse:  88    Temp:  99.4 F (37.4 C)    TempSrc:  Oral    Resp:  18    Height:      Weight: 212 lb 8.4 oz (96.4 kg) 212 lb 11.9 oz (96.5 kg)    SpO2:  98% 97% 96%   Weight change:   Intake/Output Summary (Last 24 hours) at 12/21/12 1122 Last data filed at 12/21/12 0900  Gross per 24 hour  Intake 1560.33 ml  Output      0 ml  Net 1560.33 ml   Vitals reviewed. General: sitting up in bed.  NAD HEENT: PERRL, EOMI, no scleral icterus.  Pain on palpation of maxillary sinus.   Cardiac: RRR, +SEM 2/6, loudest LUSB Pulm: b/l rhonchi, coughing with deep inhalation Abd: soft, nontender, nondistended, BS present Ext: warm and well perfused, no pedal edema, +2dp b/l Neuro: alert and oriented X3, cranial nerves II-XII grossly intact, strength and sensation to light touch equal in bilateral upper and lower extremities  Lab Results: Basic Metabolic Panel:  Recent Labs Lab 12/20/12 1718 12/21/12 0910  NA 133* 136  K 2.8* 3.0*  CL 94* 96  CO2 28 29  GLUCOSE 106* 152*  BUN 10 8  CREATININE 0.74 0.64  CALCIUM 8.8 8.4  MG 1.7  --    Liver Function Tests:  Recent Labs Lab 12/20/12 1110  AST 9  ALT 5  ALKPHOS 103  BILITOT 0.5  PROT 7.6  ALBUMIN 3.5   CBC:  Recent Labs Lab 12/20/12 1110 12/20/12 1718 12/21/12 0910  WBC 14.9* 12.1* 8.9  NEUTROABS 11.9*  --   --   HGB 12.0 10.9* 10.7*  HCT 35.6* 32.4*  31.7*  MCV 86.8 86.2 86.6  PLT 314 268 265   CBG:  Recent Labs Lab 12/20/12 1040 12/20/12 1850 12/20/12 2151 12/21/12 0812  GLUCAP 131* 107* 194* 105*   Urine Drug Screen: Drugs of Abuse     Component Value Date/Time   LABOPIA NONE DETECTED 12/29/2009 0633   COCAINSCRNUR NONE DETECTED 12/29/2009 0633   LABBENZ NONE DETECTED 12/29/2009 0633   AMPHETMU NONE DETECTED 12/29/2009 0633   THCU NONE DETECTED 12/29/2009 0633   LABBARB  Value: NONE DETECTED        DRUG SCREEN FOR MEDICAL PURPOSES ONLY.  IF CONFIRMATION IS NEEDED FOR ANY PURPOSE, NOTIFY LAB WITHIN 5 DAYS.        LOWEST DETECTABLE LIMITS FOR URINE DRUG SCREEN Drug Class       Cutoff (ng/mL) Amphetamine      1000 Barbiturate      200 Benzodiazepine   200 Tricyclics       300 Opiates          300 Cocaine          300 THC  50 12/29/2009 4782    Micro Results: Recent Results (from the past 240 hour(s))  RAPID STREP SCREEN     Status: None   Collection Time    12/20/12 11:11 AM      Result Value Range Status   Streptococcus, Group A Screen (Direct) NEG  NEGATIVE Final   Comment:       Performed at:  [facility]                    [address]                    [place], [place] [address]                    [id number]           A Rapid Antigen test may result negative if the antigen level in the     sample is below the detection level of this test. The FDA has not     cleared this test as a stand-alone test therefore the rapid antigen     negative result has reflexed to a Group A Strep culture, unit code     70060.          Source ;   Final  MRSA PCR SCREENING     Status: None   Collection Time    12/20/12  5:24 PM      Result Value Range Status   MRSA by PCR NEGATIVE  NEGATIVE Final   Comment:            The GeneXpert MRSA Assay (FDA     approved for NASAL specimens     only), is one component of a     comprehensive MRSA colonization      surveillance program. It is not     intended to diagnose MRSA     infection nor to guide or     monitor treatment for     MRSA infections.   Studies/Results: Dg Chest 2 View  12/20/2012  *RADIOLOGY REPORT*  Clinical Data: Cough.  Fever.  Shortness of breath.  Tobacco use. COPD.  Hypertension.  Diabetes.  CHEST - 2 VIEW  Comparison: 11/05/2012  Findings: Mild cardiomegaly noted with cardiothoracic index 58% on PA radiography.  No edema or pleural effusion observed.  Mild thoracic spondylosis noted.  Lower lobe airway thickening may reflect bronchitis or reactive airways disease.  IMPRESSION:  1.  Airway thickening may reflect bronchitis or reactive airways disease. 2.  Mild cardiomegaly.   Original Report Authenticated By: Gaylyn Rong, M.D.    Medications: I have reviewed the patient's current medications. Scheduled Meds: . benzonatate  100 mg Oral TID  . budesonide-formoterol  2 puff Inhalation BID  . carvedilol  6.25 mg Oral BID WC  . citalopram  20 mg Oral Daily  . doxycycline  100 mg Oral Q12H  . enoxaparin (LOVENOX) injection  40 mg Subcutaneous Q24H  . fluticasone  2 spray Each Nare Daily  . guaiFENesin  600 mg Oral BID  . hydrochlorothiazide  25 mg Oral Daily  . insulin aspart  0-9 Units Subcutaneous TID WC  . lisinopril  20 mg Oral Daily  . loratadine  10 mg Oral Daily  . magic mouthwash  5 mL Oral Q4H  . [START ON  12/22/2012] predniSONE  60 mg Oral Q breakfast  . simvastatin  20 mg Oral q1800  . sodium chloride  3 mL Intravenous Q12H  . tiotropium  18 mcg Inhalation Daily   Continuous Infusions: . sodium chloride 100 mL/hr at 12/21/12 0527   PRN Meds:.sodium chloride, albuterol, albuterol, guaiFENesin, HYDROcodone-acetaminophen, sodium chloride, traMADol, zolpidem Assessment/Plan: [name] is a 62 year old African American female with PMH of HL, HTN, COPD, DM 2, and breast cancer admitted for flu-like symptoms x5 days.   ?Bronchitis vs. COPD  exacerbation--presented with 5 days of worsening symptoms including productive cough with yellow-green sputum, fever t max 101.27F on admission, malaise, body aches, fatigue, and shortness of breath. She does endorse getting the flu vaccine this season. WBC 14.9 on admission. Flu pcr negative and rapid strep screen negative.  CXR shows airway thickening--?bronchitis or reactive airway disease, mild cardiomegaly.  Recently treated in December 2013 with levaquin and prednisone taper.  O2 sat 99% on room air on admission. On home regimen: albuterol inhaler, albuterol nebz, symbicort, and spiriva   -d/c droplet precautions  -sputum cx--normal flora -IVF -pain control--Tramadol PRN  -monitor temp--afebrile overnight -start augmentin 875-125mg  BID x7 days -start prednesione 60mg  qd and taper -atrovent inhaler -albuterol nebz -spiriva -symbicort -tessalon perles -mucinex  DIABETES MELLITUS, TYPE II--Hb A1C 7.8 (12/01/12). On home regimen metformin 1000mg  BID.  -CBG monitoring  -SSI Sensitive   TOBACCO ABUSE--still smoking approximately 4-5 cigs/day  -smoking cessation advised and counseling given   Essential hypertension, benign--BP on admission 106/64. On home regimen Coreg 6.25mg  BID and Prinzide 20-25mg  qd.  -continue home medications  -continue to monitor BP   Hypokalemia--K 3.0 on admission. Likely secondary to decreased PO intake in setting of illness.  -replace -trend bmet -continue to monitor  -f/u EKG   Leukocytosis, unspecified--WBC 14.9 on admission with 11.9 neutrophils.  Resolved and afebrile now. Possibly secondary to viral illness.  CXR ?bronchitis vs. Reactive airway disease.  Sputum cx negative.  -trend wbc   Depression--appears stable at this time. On home Celexa. No suicidal or homicidal ideation.  -continue celexa   Dispo: Admitted for observation. Disposition is deferred at this time, awaiting improvement of current medical problems. Anticipated discharge in  approximately 1 day(s).  The patient does have a current PCP (SAWHNEY,MEGHA, MD), therefore will be requiring OPC follow-up after discharge.  The patient does not have transportation limitations that hinder transportation to clinic appointments.  Services Needed at time of discharge: Y = Yes, Blank = No PT:   OT:   RN:   Equipment:   Other:     LOS: 1 day   Darden Palmer 12/21/2012, 11:22 AM

## 2012-12-22 LAB — BASIC METABOLIC PANEL
BUN: 5 mg/dL — ABNORMAL LOW (ref 6–23)
GFR calc Af Amer: >90 mL/min (ref >90)
GFR calc non Af Amer: >90 mL/min (ref >90)
Potassium: 4.2 mEq/L (ref 3.5–5.1)
Sodium: 137 mEq/L (ref 135–145)

## 2012-12-22 MED ORDER — GUAIFENESIN 100 MG/5ML PO SOLN: 5.0000 mL | ORAL | Status: DC | PRN

## 2012-12-22 MED ORDER — ALBUTEROL SULFATE HFA 108 (90 BASE) MCG/ACT IN AERS
2.0000 | INHALATION_SPRAY | RESPIRATORY_TRACT | Status: DC
  Administered 2012-12-22: 2 via RESPIRATORY_TRACT
  Filled 2012-12-22 (×2): qty 6.7

## 2012-12-22 MED ORDER — AMOXICILLIN-POT CLAVULANATE 875-125 MG PO TABS: 1.0000 | ORAL_TABLET | Freq: Two times a day (BID) | ORAL | Status: DC

## 2012-12-22 MED ORDER — PREDNISONE 20 MG PO TABS: ORAL_TABLET | ORAL | Status: DC

## 2012-12-22 MED ORDER — FLUCONAZOLE 100 MG PO TABS: 100.0000 mg | ORAL_TABLET | Freq: Every day | ORAL | Status: DC

## 2012-12-22 MED ORDER — FLUCONAZOLE 200 MG PO TABS
200.0000 mg | ORAL_TABLET | Freq: Once | ORAL | Status: AC
  Administered 2012-12-22: 200 mg via ORAL
  Filled 2012-12-22: qty 1

## 2012-12-22 MED ORDER — AMOXICILLIN-POT CLAVULANATE 875-125 MG PO TABS: 1.0000 | ORAL_TABLET | Freq: Two times a day (BID) | ORAL | Status: AC

## 2012-12-22 MED ORDER — GUAIFENESIN ER 600 MG PO TB12: 600.0000 mg | ORAL_TABLET | Freq: Two times a day (BID) | ORAL | Status: DC

## 2012-12-22 NOTE — Care Management Note (Signed)
    Page 1 of 1   12/22/2012     11:35:33 AM   CARE MANAGEMENT NOTE 12/22/2012  Patient:  [facility]   Account Number:  [id number]  Date Initiated:  12/22/2012  Documentation initiated by:  Letha Cape  Subjective/Objective Assessment:   dx flu-like  admit as observation- lives with spouse, pta indep.     Action/Plan:   Anticipated DC Date:  12/22/2012   Anticipated DC Plan:  HOME/SELF CARE      DC Planning Services  CM consult      Choice offered to / List presented to:             Status of service:  Completed, signed off Medicare Important Message given?   (If response is "NO", the following Medicare IM given date fields will be blank) Date Medicare IM given:   Date Additional Medicare IM given:    Discharge Disposition:  HOME/SELF CARE  Per UR Regulation:  Reviewed for med. necessity/level of care/duration of stay  If discussed at Long Length of Stay Meetings, dates discussed:    Comments:  12/22/12 10:54 Letha Cape RN, BSN [phone] pt lives with spouse, pta indep. patient states her medicare does not pay for her inhalers she has to pay $300 which she does not have, will see if MD has some samples to give to patient and  patient , MD does not have samples, will give patient the ones she was using here to go home with.  Patient states she has transportion.

## 2012-12-22 NOTE — Progress Notes (Signed)
Patient discharge teaching given, including activity, diet, follow-up appoints, and medications. Patient verbalized understanding of all discharge instructions. IV access was d/c'd. Vitals are stable. Skin is intact except as charted in most recent assessments. Pt to be escorted out by NT, to be driven home by family. 

## 2012-12-22 NOTE — Discharge Summary (Signed)
Internal Medicine Teaching [facility] Discharge Note  Name: [name] MRN: [id number] DOB: 27-Nov-1950 62 y.o.  Date of Admission: 12/20/2012  3:56 PM Date of Discharge: 12/22/2012 Attending Physician: Dr.  Daiva Eves Discharge Diagnosis: Principal Problem:   Bronchitis, chronic obstructive w acute bronchitis Active Problems:   DIABETES MELLITUS, TYPE II   TOBACCO ABUSE   Essential hypertension, benign   COPD   Flu-like symptoms   Hypokalemia  Discharge Medications:   Medication List    TAKE these medications       albuterol (2.5 MG/3ML) 0.083% nebulizer solution  Commonly known as:  PROVENTIL  Take 3 mLs (2.5 mg total) by nebulization every 4 (four) hours as needed. For shortness of breath     albuterol 108 (90 BASE) MCG/ACT inhaler  Commonly known as:  PROVENTIL HFA;VENTOLIN HFA  Inhale 2 puffs into the lungs every 4 (four) hours as needed for wheezing.     amoxicillin-clavulanate 875-125 MG per tablet  Commonly known as:  AUGMENTIN  Take 1 tablet by mouth every 12 (twelve) hours.     budesonide-formoterol 80-4.5 MCG/ACT inhaler  Commonly known as:  SYMBICORT  Inhale 2 puffs into the lungs 2 (two) times daily.     carvedilol 6.25 MG tablet  Commonly known as:  COREG  Take 6.25 mg by mouth 2 (two) times daily with a meal.     citalopram 40 MG tablet  Commonly known as:  CELEXA  Take 40 mg by mouth every morning.     fluconazole 100 MG tablet  Commonly known as:  DIFLUCAN  Take 1 tablet (100 mg total) by mouth daily.     fluticasone 50 MCG/ACT nasal spray  Commonly known as:  FLONASE  Place 2 sprays into the nose daily as needed for allergies.     guaiFENesin 600 MG 12 hr tablet  Commonly known as:  MUCINEX  Take 1 tablet (600 mg total) by mouth 2 (two) times daily.     guaiFENesin 100 MG/5ML Soln  Commonly known as:  ROBITUSSIN  Take 5 mLs (100 mg total) by mouth every 4 (four) hours as needed.     lisinopril-hydrochlorothiazide 20-25 MG per  tablet  Commonly known as:  PRINZIDE,ZESTORETIC  Take 1 tablet by mouth every morning.     loratadine 10 MG tablet  Commonly known as:  CLARITIN  Take 1 tablet (10 mg total) by mouth daily.     metFORMIN 1000 MG tablet  Commonly known as:  GLUCOPHAGE  Take 1 tablet (1,000 mg total) by mouth 2 (two) times daily with a meal.     pravastatin 40 MG tablet  Commonly known as:  PRAVACHOL  Take 40 mg by mouth at bedtime.     predniSONE 20 MG tablet  Commonly known as:  DELTASONE  3 Tabs PO Days 1-3, then 2 tabs PO Days 4-6, then 1 tab PO Day 7-9, then Half Tab PO Day 10-12     tiotropium 18 MCG inhalation capsule  Commonly known as:  SPIRIVA  Place 18 mcg into inhaler and inhale daily.     traMADol 50 MG tablet  Commonly known as:  ULTRAM  Take 1 tablet (50 mg total) by mouth every 8 (eight) hours as needed for pain.     zolpidem 5 MG tablet  Commonly known as:  AMBIEN  Take 1 tablet (5 mg total) by mouth at bedtime as needed for sleep.       Disposition and follow-up:   [name] was discharged from [facility] in Stable condition.  At the hospital follow up visit please address: ?bronchitis vs. Flu like symptoms--discharged on 10 day course of augmentin ?copd exacerbation--discharged on prednisone 12 day taper  Follow-up Appointments:     Follow-up Information   Follow up with SAWHNEY,MEGHA, MD. Schedule an appointment as soon as possible for a visit in 2 weeks.   Contact information:   [address] [place] [address] [place] [address] [phone]      Discharge Orders   Future Orders Complete By Expires     Call MD for:  difficulty breathing, headache or visual disturbances  As directed     Call MD for:  extreme fatigue  As directed     Call MD for:  persistant nausea and vomiting  As directed     Call MD for:  temperature >100.4  As directed     Diet - low sodium heart healthy  As directed     Increase activity slowly  As directed        Procedures Performed:  Dg Chest 2 View  12/20/2012  *RADIOLOGY REPORT*  Clinical Data: Cough.  Fever.  Shortness of breath.  Tobacco use. COPD.  Hypertension.  Diabetes.  CHEST - 2 VIEW  Comparison: 11/05/2012  Findings: Mild cardiomegaly noted with cardiothoracic index 58% on PA radiography.  No edema or pleural effusion observed.  Mild thoracic spondylosis noted.  Lower lobe airway thickening may reflect bronchitis or reactive airways disease.  IMPRESSION:  1.  Airway thickening may reflect bronchitis or reactive airways disease. 2.  Mild cardiomegaly.   Original Report Authenticated By: Gaylyn Rong, M.D.    Dg Shoulder Left  12/01/2012  *RADIOLOGY REPORT*  Clinical Data: Shoulder popped  LEFT SHOULDER - 2+ VIEW  Comparison: None.  Findings: Glenohumeral joint is intact.  No evidence of scapular fracture  or humeral fracture.  The acromioclavicular joint is intact.  Anterior cervical fixation noted.  IMPRESSION: No acute osseous abnormality.   Original Report Authenticated By: Genevive Bi, M.D.    Admission HPI: [name] is a 62 year old African American female with PMH of HL, HTN, DM 2, COPD, and breast cancer presented to clinic today for 5 days of worsening flu-like symptoms. She claims she has been feeling bad progressively over the past 5 days. She says her symptoms started with sore throat and then progressed to body aches, b/l ear pain, productive cough with yellow-green sputum, and fever and chills. She is noted to have a fever in clinic today of 101.5 F. She endorses difficulty swallowing and headaches, nausea and vomiting this morning. She also had diarrhea last week for two days that has since then resolved. She currently denies any chest pain, shortness of breath, abdominal pain, or any urinary complaints at this time. Per clinic resident, she endorses her grandson as a sick contact with a URI last week who is now improving.  Of note, She did get the flu shot this season. She  was also seen in the ED in December for COPD exacerbation and bronchitis, was on levaquin and continued on levaquin, albuterol, and prednisone taper at that time.   Hospital Course by problem list:   Bronchitis, chronic obstructive w acute bronchitis--presenting with worsening flu-like symptoms on admission x5 days including productive cough with yellow-green sputum, fever t max 101.14F on admission, malaise, body aches, fatigue, and shortness of breath. She does endorse getting the flu vaccine this season. WBC 14.9 on admission--resolved  during hospital course. Flu pcr negative and rapid strep screen negative. CXR showed airway thickening--?bronchitis or reactive airway disease, mild cardiomegaly. Recently treated in December 2013 with levaquin and prednisone taper. O2 sat 99% on room air on admission. On home regimen: albuterol inhaler, albuterol nebz, symbicort, and spiriva.  Home medications continued during hospital course and also started on Augmentin 10 day course oral and 12 day prednisone taper.  Shortness of breath, fever, cough, and body aches improved during hospital course.  Will need to follow up with pcp.      DIABETES MELLITUS, TYPE II--Hb A1C 7.8 (12/01/12). On home regimen metformin 1000mg  BID.  CBG monitoring during hospital course and placed on sliding scale insulin.  Home med resumed on discharge.  Follow up with pcp.      TOBACCO ABUSE--continues to smoke approximately 4-5 cigs/day.  Smoking cessation advised and counseling given.      Essential hypertension, benign--BP on admission 106/64. On home regimen Coreg 6.25mg  BID and Prinzide 20-25mg  qd. BP monitored during hospital course and stable.  Continued on home medications.  Follow up pcp.      ?COPD exacerbation--initially complained of shortness of breath and productive cough.  Continued on home regimen: albuterol inhaler, albuterol nebz, symbicort, and spiriva.  Also started on 10 day course of Augmenting and prednisone taper.   Shortness of breath and cough improved during hospital course.      Hypokalemia--K 3.0 on admission. Likely secondary to decreased PO intake in setting of illness. Replaced during hospital course.  K 4.2 on discharge.    Discharge Vitals:  BP 130/64  Pulse 86  Temp(Src) 99.1 F (37.3 C) (Oral)  Resp 20  Ht 5\' 5"  (1.651 m)  Wt 218 lb 4.1 oz (99 kg)  BMI 36.32 kg/m2  SpO2 97%  Discharge Labs:  Results for orders placed during the hospital encounter of 12/20/12 (from the past 24 hour(s))  GLUCOSE, CAPILLARY     Status: Abnormal   Collection Time    12/21/12  4:55 PM      Result Value Range   Glucose-Capillary 137 (*) 70 - 99 mg/dL  GLUCOSE, CAPILLARY     Status: Abnormal   Collection Time    12/21/12 10:17 PM      Result Value Range   Glucose-Capillary 128 (*) 70 - 99 mg/dL   Comment 1 Documented in Chart     Comment 2 Notify RN    GLUCOSE, CAPILLARY     Status: Abnormal   Collection Time    12/22/12  7:54 AM      Result Value Range   Glucose-Capillary 145 (*) 70 - 99 mg/dL  BASIC METABOLIC PANEL     Status: Abnormal   Collection Time    12/22/12  8:35 AM      Result Value Range   Sodium 137  135 - 145 mEq/L   Potassium 4.2  3.5 - 5.1 mEq/L   Chloride 101  96 - 112 mEq/L   CO2 28  19 - 32 mEq/L   Glucose, Bld 132 (*) 70 - 99 mg/dL   BUN 5 (*) 6 - 23 mg/dL   Creatinine, Ser 4.09  0.50 - 1.10 mg/dL   Calcium 8.4  8.4 - 81.1 mg/dL   GFR calc non Af Amer >90  >90 mL/min   GFR calc Af Amer >90  >90 mL/min  MAGNESIUM     Status: None   Collection Time    12/22/12  8:35 AM  Result Value Range   Magnesium 1.9  1.5 - 2.5 mg/dL    Signed: Darden Palmer 12/22/2012, 3:32 PM   Time Spent on Discharge: 35 minutes Services Ordered on Discharge: none Equipment Ordered on Discharge: none

## 2012-12-22 NOTE — Progress Notes (Signed)
Subjective: [name] was seen and examined at bedside.  She claims to be feeling much better since admission with improved cough and less sinus pressure complaints.  She currently denies any N/V/D, chills, chest pain, shortness of breath, abdominal pain, or any urinary complaints at this time.  She would like to go home today with her husband picking her up.  She will be able to pick up her prescriptions from the outpatient pharmacy on her way home.    Objective: Vital signs in last 24 hours: Filed Vitals:   12/21/12 2220 12/22/12 0442 12/22/12 0500 12/22/12 0824  BP: 117/57 146/69    Pulse: 78 86    Temp: 98.2 F (36.8 C) 99.1 F (37.3 C)    TempSrc: Oral Oral    Resp: 18 20    Height:      Weight:   218 lb 4.1 oz (99 kg)   SpO2: 99% 100%  97%   Weight change: 2 lb 3.3 oz (1 kg) No intake or output data in the 24 hours ending 12/22/12 0957 Vitals reviewed. General: sitting up in bed.  NAD HEENT: PERRL, EOMI, no scleral icterus.   Cardiac: RRR, +SEM 2/6, loudest LUSB Pulm: b/l rhonchi improving, good air entry b/l, -wheezing Abd: soft, nontender, nondistended, BS present Ext: warm and well perfused, no pedal edema, +2dp b/l Neuro: alert and oriented X3, cranial nerves II-XII grossly intact, strength and sensation to light touch equal in bilateral upper and lower extremities  Lab Results: Basic Metabolic Panel:  Recent Labs Lab 12/20/12 1718 12/21/12 0910  NA 133* 136  K 2.8* 3.0*  CL 94* 96  CO2 28 29  GLUCOSE 106* 152*  BUN 10 8  CREATININE 0.74 0.64  CALCIUM 8.8 8.4  MG 1.7  --    Liver Function Tests:  Recent Labs Lab 12/20/12 1110  AST 9  ALT 5  ALKPHOS 103  BILITOT 0.5  PROT 7.6  ALBUMIN 3.5   CBC:  Recent Labs Lab 12/20/12 1110 12/20/12 1718 12/21/12 0910  WBC 14.9* 12.1* 8.9  NEUTROABS 11.9*  --   --   HGB 12.0 10.9* 10.7*  HCT 35.6* 32.4* 31.7*  MCV 86.8 86.2 86.6  PLT 314 268 265   CBG:  Recent Labs Lab 12/20/12 2151  12/21/12 0812 12/21/12 1254 12/21/12 1655 12/21/12 2217 12/22/12 0754  GLUCAP 194* 105* 138* 137* 128* 145*   Urine Drug Screen: Drugs of Abuse     Component Value Date/Time   LABOPIA NONE DETECTED 12/29/2009 0633   COCAINSCRNUR NONE DETECTED 12/29/2009 0633   LABBENZ NONE DETECTED 12/29/2009 0633   AMPHETMU NONE DETECTED 12/29/2009 0633   THCU NONE DETECTED 12/29/2009 0633   LABBARB  Value: NONE DETECTED        DRUG SCREEN FOR MEDICAL PURPOSES ONLY.  IF CONFIRMATION IS NEEDED FOR ANY PURPOSE, NOTIFY LAB WITHIN 5 DAYS.        LOWEST DETECTABLE LIMITS FOR URINE DRUG SCREEN Drug Class       Cutoff (ng/mL) Amphetamine      1000 Barbiturate      200 Benzodiazepine   200 Tricyclics       300 Opiates          300 Cocaine          300 THC              50 12/29/2009 1610    Micro Results: Recent Results (from the past 240 hour(s))  RAPID STREP SCREEN  Status: None   Collection Time    12/20/12 11:11 AM      Result Value Range Status   Streptococcus, Group A Screen (Direct) NEG  NEGATIVE Final   Comment:       Performed at:  [facility]                    [address]                    [place], [place] [address]                    [id number]           A Rapid Antigen test may result negative if the antigen level in the     sample is below the detection level of this test. The FDA has not     cleared this test as a stand-alone test therefore the rapid antigen     negative result has reflexed to a Group A Strep culture, unit code     70060.          Source ;   Final  MRSA PCR SCREENING     Status: None   Collection Time    12/20/12  5:24 PM      Result Value Range Status   MRSA by PCR NEGATIVE  NEGATIVE Final   Comment:            The GeneXpert MRSA Assay (FDA     approved for NASAL specimens     only), is one component of a     comprehensive MRSA colonization     surveillance program. It is not     intended to  diagnose MRSA     infection nor to guide or     monitor treatment for     MRSA infections.   Studies/Results: Dg Chest 2 View  12/20/2012  *RADIOLOGY REPORT*  Clinical Data: Cough.  Fever.  Shortness of breath.  Tobacco use. COPD.  Hypertension.  Diabetes.  CHEST - 2 VIEW  Comparison: 11/05/2012  Findings: Mild cardiomegaly noted with cardiothoracic index 58% on PA radiography.  No edema or pleural effusion observed.  Mild thoracic spondylosis noted.  Lower lobe airway thickening may reflect bronchitis or reactive airways disease.  IMPRESSION:  1.  Airway thickening may reflect bronchitis or reactive airways disease. 2.  Mild cardiomegaly.   Original Report Authenticated By: Gaylyn Rong, M.D.    Medications: I have reviewed the patient's current medications. Scheduled Meds: . amoxicillin-clavulanate  1 tablet Oral Q12H  . benzonatate  100 mg Oral TID  . budesonide-formoterol  2 puff Inhalation BID  . carvedilol  6.25 mg Oral BID WC  . citalopram  20 mg Oral Daily  . enoxaparin (LOVENOX) injection  40 mg Subcutaneous Q24H  . fluconazole  200 mg Oral Once  . fluticasone  2 spray Each Nare Daily  . guaiFENesin  600 mg Oral BID  . hydrochlorothiazide  25 mg Oral Daily  . insulin aspart  0-9 Units Subcutaneous TID WC  . lisinopril  20 mg Oral Daily  . loratadine  10 mg Oral Daily  . magic mouthwash  5 mL Oral Q4H  . predniSONE  60 mg Oral Q breakfast  . simvastatin  20 mg Oral q1800  .  sodium chloride  3 mL Intravenous Q12H  . tiotropium  18 mcg Inhalation Daily   Continuous Infusions:   PRN Meds:.albuterol, albuterol, guaiFENesin, HYDROcodone-acetaminophen, traMADol, zolpidem Assessment/Plan: [name] is a 62 year old African American female with PMH of HL, HTN, COPD, DM 2, and breast cancer admitted for flu-like symptoms x5 days.   ?Bronchitis vs. COPD exacerbation--presented with 5 days of worsening symptoms including productive cough with yellow-green sputum, fever t max  101.30F on admission, malaise, body aches, fatigue, and shortness of breath. She does endorse getting the flu vaccine this season. WBC 14.9 on admission. Flu pcr negative and rapid strep screen negative.  CXR shows airway thickening--?bronchitis or reactive airway disease, mild cardiomegaly.  Recently treated in December 2013 with levaquin and prednisone taper.  O2 sat 99% on room air on admission. On home regimen: albuterol inhaler, albuterol nebz, symbicort, and spiriva   -d/c droplet precautions  -sputum cx--normal flora -IVF -pain control--Tramadol PRN  -monitor temp--afebrile overnight -continue augmentin 875-125mg  BID x10 days total--day 2 today -continue prednesione taper -atrovent inhaler -albuterol nebz -spiriva -symbicort -tessalon perles -mucinex  DIABETES MELLITUS, TYPE II--Hb A1C 7.8 (12/01/12). On home regimen metformin 1000mg  BID.  -CBG monitoring  -SSI Sensitive   TOBACCO ABUSE--still smoking approximately 4-5 cigs/day  -smoking cessation advised and counseling given   Essential hypertension, benign--BP on admission 106/64. On home regimen Coreg 6.25mg  BID and Prinzide 20-25mg  qd.  -continue home medications  -continue to monitor BP   Hypokalemia--K 3.0 on admission. Likely secondary to decreased PO intake in setting of illness.  -replace -trend bmet -continue to monitor   Leukocytosis, unspecified--WBC 14.9 on admission with 11.9 neutrophils.  Resolved and afebrile now. Possibly secondary to viral illness.  CXR ?bronchitis vs. Reactive airway disease.  Sputum cx negative.  -trend wbc   Depression--appears stable at this time. On home Celexa. No suicidal or homicidal ideation.  -continue celexa   Dispo: likely d/c home today  The patient does have a current PCP (SAWHNEY,MEGHA, MD), therefore will be requiring OPC follow-up after discharge.  The patient does not have transportation limitations that hinder transportation to clinic appointments.  Services Needed  at time of discharge: Y = Yes, Blank = No PT:   OT:   RN:   Equipment:   Other:     LOS: 2 days   Darden Palmer 12/22/2012, 9:57 AM

## 2012-12-25 ENCOUNTER — Encounter: Payer: Self-pay | Admitting: Internal Medicine

## 2013-01-03 ENCOUNTER — Ambulatory Visit (INDEPENDENT_AMBULATORY_CARE_PROVIDER_SITE_OTHER): Payer: Medicare Other | Admitting: Internal Medicine

## 2013-01-03 ENCOUNTER — Encounter: Payer: Self-pay | Admitting: Internal Medicine

## 2013-01-03 VITALS — BP 136/78 | HR 92 | Temp 99.2°F | Ht 65.0 in | Wt 215.0 lb

## 2013-01-03 DIAGNOSIS — Z23 Encounter for immunization: Secondary | ICD-10-CM | POA: Diagnosis not present

## 2013-01-03 DIAGNOSIS — Z0000 Encounter for general adult medical examination without abnormal findings: Secondary | ICD-10-CM

## 2013-01-03 DIAGNOSIS — R928 Other abnormal and inconclusive findings on diagnostic imaging of breast: Secondary | ICD-10-CM | POA: Diagnosis not present

## 2013-01-03 DIAGNOSIS — J440 Chronic obstructive pulmonary disease with acute lower respiratory infection: Secondary | ICD-10-CM

## 2013-01-03 DIAGNOSIS — M25512 Pain in left shoulder: Secondary | ICD-10-CM

## 2013-01-03 DIAGNOSIS — R071 Chest pain on breathing: Secondary | ICD-10-CM | POA: Diagnosis not present

## 2013-01-03 DIAGNOSIS — J111 Influenza due to unidentified influenza virus with other respiratory manifestations: Secondary | ICD-10-CM | POA: Diagnosis not present

## 2013-01-03 DIAGNOSIS — Z299 Encounter for prophylactic measures, unspecified: Secondary | ICD-10-CM | POA: Diagnosis not present

## 2013-01-03 DIAGNOSIS — E119 Type 2 diabetes mellitus without complications: Secondary | ICD-10-CM | POA: Diagnosis not present

## 2013-01-03 DIAGNOSIS — J410 Simple chronic bronchitis: Secondary | ICD-10-CM | POA: Diagnosis not present

## 2013-01-03 DIAGNOSIS — G4700 Insomnia, unspecified: Secondary | ICD-10-CM | POA: Diagnosis not present

## 2013-01-03 DIAGNOSIS — J020 Streptococcal pharyngitis: Secondary | ICD-10-CM | POA: Diagnosis not present

## 2013-01-03 DIAGNOSIS — M25519 Pain in unspecified shoulder: Secondary | ICD-10-CM | POA: Diagnosis not present

## 2013-01-03 LAB — GLUCOSE, CAPILLARY: Glucose-Capillary: 178 mg/dL — ABNORMAL HIGH (ref 70–99)

## 2013-01-03 MED ORDER — TETANUS-DIPHTH-ACELL PERTUSSIS 5-2.5-18.5 LF-MCG/0.5 IM SUSP: 0.5000 mL | Freq: Once | INTRAMUSCULAR | Status: DC

## 2013-01-03 MED ORDER — TRAMADOL HCL 50 MG PO TABS: 50.0000 mg | ORAL_TABLET | Freq: Three times a day (TID) | ORAL | Status: DC | PRN

## 2013-01-03 NOTE — Assessment & Plan Note (Signed)
CBGs are well controlled. Her A1c was 7.8 when checked in 01 2014 and her metformin dose was increased from 500 twice a day to 1000 twice a day. Continue her on metformin for now. -Foot exam was done today. -Ophthalmology referral was placed.

## 2013-01-03 NOTE — Patient Instructions (Signed)
General Instructions: Please schedule a follow up appointment in 3 months or sooner if needed . Please bring your medication bottles with your next appointment. Please take your medicines as prescribed. I will call you with your lab results if anything will be abnormal.    Treatment Goals:  Goals (1 Years of Data) as of 01/03/13         As of Today 12/22/12 12/22/12 12/21/12 12/21/12     Blood Pressure    . Blood Pressure < 140/90  136/78 130/64 146/69 117/57 108/54     Lifestyle    . Quit smoking / using tobacco           Result Component    . HEMOGLOBIN A1C < 7.0          . LDL CALC < 100            Progress Toward Treatment Goals:  Treatment Goal 01/03/2013  Hemoglobin A1C at goal  Blood pressure at goal  Stop smoking smoking less    Self Care Goals & Plans:  Self Care Goal 01/03/2013  Manage my medications take my medicines as prescribed; bring my medications to every visit; refill my medications on time  Monitor my health keep track of my blood glucose; bring my glucose meter and log to each visit  Eat healthy foods drink diet soda or water instead of juice or soda; eat more vegetables  Be physically active -  Stop smoking -    Home Blood Glucose Monitoring 01/03/2013  Check my blood sugar 3 times a day  When to check my blood sugar before dinner; before meals     Care Management & Community Referrals:  Referral 01/03/2013  Referrals made for care management support none needed

## 2013-01-03 NOTE — Progress Notes (Signed)
Subjective:   [name]   DOB: [birth year] 62 y.o.   MRN: [id number]  HPI:62 year old woman with past medical history significant for COPD, depression, hypertension, type 2 diabetes mellitus presents to the clinic for a hospital followup.  Patient was recently hospitalized throat stool 2/12/ 2014 to 12/22/2012 for bronchitis with inability to keep anything down. She was treated with prednisone and antibiotics. She was discharged home on tapering dose of prednisone and Augmentin patient was supposed to take it for 9 days but apparently she understood that she is supposed to take it for 18 days. She is still taking her Augmentin but has completed her prednisone. She reports feeling much better today- denies any chest pain, shortness of breath, runny nose, pain in ear.   She still reports having pain in her left shoulder- rates her pain 6/10, describes it as dull achy pain that gets worse with abduction and adduction of the shoulder. Denies any tingling or numbness associated with it. She is requesting refill on her tramadol.   Past Medical History  Diagnosis Date  . Hyperlipidemia   . Hypertension   . Tobacco abuse   . COPD (chronic obstructive pulmonary disease)     History of multiple hospital admissions for exercabation   . Asthma   . Breast cancer 1991    s/p lumpectomy, chemotherapy and radiation therapy in 1991. Mammogram in 2007 was normal.  . Sigmoid diverticulitis 80/2008  . Anxiety   . Depression   . Obesity   . GERD (gastroesophageal reflux disease)   . Heart murmur 10/05/11    "first time I ever heard I had one was today"  . Pneumonia   . Shortness of breath 10/05/11    "at rest; lying down; w/exertion"  . Diabetes mellitus   . Bronchitis     h/o  . Diarrhea     h/o  . Constipated     h/o   Family History  Problem Relation Age of Onset  . Cancer Mother    History   Social History  . Marital Status: Married    Spouse Name: N/A   Number of Children: N/A  . Years of Education: N/A   Occupational History  . Not on file.   Social History Main Topics  . Smoking status: Current Some Day Smoker -- 0.40 packs/day for 40 years    Types: Cigarettes  . Smokeless tobacco: Never Used     Comment: given QUIT line info - husband states he will quit when she does  . Alcohol Use: 2.4 oz/week    4 Cans of beer per week     Comment: "only on the weekends"  . Drug Use: No  . Sexually Active: Not on file   Other Topics Concern  . Not on file   Social History Narrative   Lives in [place] with her husband.   Takes care of 3 grand children.   Trying to find a job, has financial difficulties.         Review of Systems: General: Denies fever, chills, diaphoresis, appetite change and fatigue. HEENT: Denies photophobia, eye pain, redness, hearing loss, ear pain, congestion, sore throat, rhinorrhea, sneezing, mouth sores, trouble swallowing, neck pain, neck stiffness and tinnitus. Respiratory: Denies SOB, DOE, cough, chest tightness, and wheezing. Cardiovascular: Denies to chest pain, palpitations and leg swelling. Gastrointestinal: Denies nausea, vomiting, abdominal pain, diarrhea, constipation, blood in stool and abdominal distention. Genitourinary: Denies dysuria, urgency, frequency, hematuria, flank pain and difficulty urinating.  Musculoskeletal: Denies myalgias, back pain, joint swelling, arthralgias and gait problem.  Skin: Denies pallor, rash and wound. Neurological: Denies dizziness, seizures, syncope, weakness, light-headedness, numbness and headaches. Hematological: Denies adenopathy, easy bruising, personal or family bleeding history. Psychiatric/Behavioral: Denies suicidal ideation, mood changes, confusion, nervousness, sleep disturbance and agitation.    Current Outpatient Medications: Current Outpatient Prescriptions  Medication Sig Dispense Refill  . albuterol (PROVENTIL HFA;VENTOLIN HFA) 108 (90 BASE)  MCG/ACT inhaler Inhale 2 puffs into the lungs every 4 (four) hours as needed for wheezing.  1 Inhaler  11  . albuterol (PROVENTIL) (2.5 MG/3ML) 0.083% nebulizer solution Take 3 mLs (2.5 mg total) by nebulization every 4 (four) hours as needed. For shortness of breath  75 mL  3  . budesonide-formoterol (SYMBICORT) 80-4.5 MCG/ACT inhaler Inhale 2 puffs into the lungs 2 (two) times daily.      . carvedilol (COREG) 6.25 MG tablet Take 6.25 mg by mouth 2 (two) times daily with a meal.      . citalopram (CELEXA) 40 MG tablet Take 40 mg by mouth every morning.      . fluconazole (DIFLUCAN) 100 MG tablet Take 1 tablet (100 mg total) by mouth daily.  14 tablet  0  . fluticasone (FLONASE) 50 MCG/ACT nasal spray Place 2 sprays into the nose daily as needed for allergies.      Marland Kitchen guaiFENesin (MUCINEX) 600 MG 12 hr tablet Take 1 tablet (600 mg total) by mouth 2 (two) times daily.      Marland Kitchen guaiFENesin (ROBITUSSIN) 100 MG/5ML SOLN Take 5 mLs (100 mg total) by mouth every 4 (four) hours as needed.  1200 mL    . lisinopril-hydrochlorothiazide (PRINZIDE,ZESTORETIC) 20-25 MG per tablet Take 1 tablet by mouth every morning.      . loratadine (CLARITIN) 10 MG tablet Take 1 tablet (10 mg total) by mouth daily.  30 tablet  3  . metFORMIN (GLUCOPHAGE) 1000 MG tablet Take 1 tablet (1,000 mg total) by mouth 2 (two) times daily with a meal.  60 tablet  PRN  . pravastatin (PRAVACHOL) 40 MG tablet Take 40 mg by mouth at bedtime.      . predniSONE (DELTASONE) 20 MG tablet 3 Tabs PO Days 1-3, then 2 tabs PO Days 4-6, then 1 tab PO Day 7-9, then Half Tab PO Day 10-12  20 tablet  0  . tiotropium (SPIRIVA) 18 MCG inhalation capsule Place 18 mcg into inhaler and inhale daily.      . traMADol (ULTRAM) 50 MG tablet Take 1 tablet (50 mg total) by mouth every 8 (eight) hours as needed for pain.  40 tablet  0  . zolpidem (AMBIEN) 5 MG tablet Take 1 tablet (5 mg total) by mouth at bedtime as needed for sleep.  15 tablet  0  . [DISCONTINUED]  albuterol (PROVENTIL,VENTOLIN) 90 MCG/ACT inhaler Inhale 2 puffs into the lungs every 6 (six) hours as needed for wheezing.  17 g  12   No current facility-administered medications for this visit.    Allergies: No Known Allergies    Objective:   Physical Exam: Filed Vitals:   01/03/13 1015  BP: 136/78  Pulse: 92  Temp: 99.2 F (37.3 C)    General: Vital signs reviewed and noted. Well-developed, well-nourished, in no acute distress; alert, appropriate and cooperative throughout examination. Head: Normocephalic, atraumatic Lungs: Normal respiratory effort. Clear to auscultation BL without crackles or wheezes. Heart: RRR. S1 and S2 normal without gallop, murmur, or rubs. Abdomen:BS normoactive. Soft, Nondistended, non-tender.  No masses or organomegaly. Extremities: No pretibial edema.     Assessment & Plan:

## 2013-01-03 NOTE — Assessment & Plan Note (Signed)
Hospital follow up for acute bronchitis with rhinosinusitis. She reports feeling much better today with no shortness of breath, cough , runny nose or fevers. She finished her prednisone taper but was still taking her Augmentin( she has already taken 12 days of antibiotics since her discharge). Her symptoms have resolved at this time and she was advised to stop taking her antibiotic.

## 2013-01-03 NOTE — Assessment & Plan Note (Signed)
She continues to report pain in her left shoulder that gets worse with movement. On exam she had difficulty abducting her arm, mostly between 60 and 120. This could represent rotator cuff tendinopathy, though I am not sure . She had negative drop arm test that makes full tear unlikley. Her x-rays were negative for any degenerative joint disease. Adhesive capsulitis appears unlikley with some range of motion atleast being there, less severe pain and absence of any stiffness on exam.  -Continue NSAIDs and tramadol for now -Apply heat to the affected area.  -May consider sports medicine referral if pain persists despite conservative measures.

## 2013-01-03 NOTE — Assessment & Plan Note (Signed)
She got a tetanus shot today. She would like to postpone colonoscopy for now. Need to discuss with future visits. Plans to get pap smear with next visits.

## 2013-01-16 ENCOUNTER — Other Ambulatory Visit: Payer: Self-pay | Admitting: Internal Medicine

## 2013-02-06 ENCOUNTER — Other Ambulatory Visit: Payer: Self-pay | Admitting: *Deleted

## 2013-02-06 DIAGNOSIS — F329 Major depressive disorder, single episode, unspecified: Secondary | ICD-10-CM

## 2013-02-07 MED ORDER — CITALOPRAM HYDROBROMIDE 40 MG PO TABS: 40.0000 mg | ORAL_TABLET | Freq: Every morning | ORAL | Status: DC

## 2013-02-07 NOTE — Telephone Encounter (Signed)
Pt aware.

## 2013-02-08 DIAGNOSIS — E119 Type 2 diabetes mellitus without complications: Secondary | ICD-10-CM | POA: Diagnosis not present

## 2013-02-13 ENCOUNTER — Other Ambulatory Visit: Payer: Self-pay | Admitting: Internal Medicine

## 2013-02-13 DIAGNOSIS — J309 Allergic rhinitis, unspecified: Secondary | ICD-10-CM

## 2013-02-16 ENCOUNTER — Emergency Department (HOSPITAL_COMMUNITY)
Admission: EM | Admit: 2013-02-16 | Discharge: 2013-02-16 | Disposition: A | Discharge status: Home / Self Care | Payer: Medicare Other | Attending: Emergency Medicine | Admitting: Emergency Medicine

## 2013-02-16 ENCOUNTER — Emergency Department (HOSPITAL_COMMUNITY): Payer: Medicare Other

## 2013-02-16 ENCOUNTER — Encounter (HOSPITAL_COMMUNITY): Payer: Self-pay | Admitting: *Deleted

## 2013-02-16 DIAGNOSIS — F329 Major depressive disorder, single episode, unspecified: Secondary | ICD-10-CM | POA: Diagnosis not present

## 2013-02-16 DIAGNOSIS — Z8709 Personal history of other diseases of the respiratory system: Secondary | ICD-10-CM | POA: Diagnosis not present

## 2013-02-16 DIAGNOSIS — Z853 Personal history of malignant neoplasm of breast: Secondary | ICD-10-CM | POA: Insufficient documentation

## 2013-02-16 DIAGNOSIS — F411 Generalized anxiety disorder: Secondary | ICD-10-CM | POA: Insufficient documentation

## 2013-02-16 DIAGNOSIS — R0789 Other chest pain: Secondary | ICD-10-CM

## 2013-02-16 DIAGNOSIS — R071 Chest pain on breathing: Secondary | ICD-10-CM | POA: Diagnosis not present

## 2013-02-16 DIAGNOSIS — I10 Essential (primary) hypertension: Secondary | ICD-10-CM | POA: Insufficient documentation

## 2013-02-16 DIAGNOSIS — Z79899 Other long term (current) drug therapy: Secondary | ICD-10-CM | POA: Insufficient documentation

## 2013-02-16 DIAGNOSIS — J441 Chronic obstructive pulmonary disease with (acute) exacerbation: Secondary | ICD-10-CM | POA: Diagnosis not present

## 2013-02-16 DIAGNOSIS — Z8679 Personal history of other diseases of the circulatory system: Secondary | ICD-10-CM | POA: Diagnosis not present

## 2013-02-16 DIAGNOSIS — J449 Chronic obstructive pulmonary disease, unspecified: Secondary | ICD-10-CM | POA: Insufficient documentation

## 2013-02-16 DIAGNOSIS — Z8719 Personal history of other diseases of the digestive system: Secondary | ICD-10-CM | POA: Diagnosis not present

## 2013-02-16 DIAGNOSIS — Z8701 Personal history of pneumonia (recurrent): Secondary | ICD-10-CM | POA: Diagnosis not present

## 2013-02-16 DIAGNOSIS — R059 Cough, unspecified: Secondary | ICD-10-CM | POA: Diagnosis not present

## 2013-02-16 DIAGNOSIS — F17200 Nicotine dependence, unspecified, uncomplicated: Secondary | ICD-10-CM | POA: Diagnosis not present

## 2013-02-16 DIAGNOSIS — E119 Type 2 diabetes mellitus without complications: Secondary | ICD-10-CM | POA: Diagnosis not present

## 2013-02-16 DIAGNOSIS — E785 Hyperlipidemia, unspecified: Secondary | ICD-10-CM | POA: Insufficient documentation

## 2013-02-16 DIAGNOSIS — E669 Obesity, unspecified: Secondary | ICD-10-CM | POA: Insufficient documentation

## 2013-02-16 DIAGNOSIS — R062 Wheezing: Secondary | ICD-10-CM | POA: Diagnosis not present

## 2013-02-16 DIAGNOSIS — J4489 Other specified chronic obstructive pulmonary disease: Secondary | ICD-10-CM | POA: Insufficient documentation

## 2013-02-16 DIAGNOSIS — F3289 Other specified depressive episodes: Secondary | ICD-10-CM | POA: Insufficient documentation

## 2013-02-16 DIAGNOSIS — R05 Cough: Secondary | ICD-10-CM

## 2013-02-16 DIAGNOSIS — R52 Pain, unspecified: Secondary | ICD-10-CM | POA: Diagnosis not present

## 2013-02-16 MED ORDER — PREDNISONE 20 MG PO TABS
60.0000 mg | ORAL_TABLET | Freq: Once | ORAL | Status: DC
  Filled 2013-02-16: qty 3

## 2013-02-16 MED ORDER — PREDNISONE 50 MG PO TABS: 50.0000 mg | ORAL_TABLET | Freq: Every day | ORAL | Status: DC

## 2013-02-16 MED ORDER — HYDROCODONE-ACETAMINOPHEN 5-325 MG PO TABS: 1.0000 | ORAL_TABLET | ORAL | Status: DC | PRN

## 2013-02-16 MED ORDER — IPRATROPIUM BROMIDE 0.02 % IN SOLN
0.5000 mg | Freq: Once | RESPIRATORY_TRACT | Status: AC
  Administered 2013-02-16: 0.5 mg via RESPIRATORY_TRACT
  Filled 2013-02-16: qty 2.5

## 2013-02-16 MED ORDER — OXYCODONE-ACETAMINOPHEN 5-325 MG PO TABS
1.0000 | ORAL_TABLET | Freq: Once | ORAL | Status: AC
  Administered 2013-02-16: 1 via ORAL
  Filled 2013-02-16: qty 1

## 2013-02-16 MED ORDER — LORATADINE 10 MG PO TABS: 10.0000 mg | ORAL_TABLET | Freq: Every day | ORAL | Status: DC

## 2013-02-16 MED ORDER — ALBUTEROL SULFATE (5 MG/ML) 0.5% IN NEBU
5.0000 mg | INHALATION_SOLUTION | Freq: Once | RESPIRATORY_TRACT | Status: AC
  Administered 2013-02-16: 5 mg via RESPIRATORY_TRACT
  Filled 2013-02-16: qty 1

## 2013-02-16 MED ORDER — ALBUTEROL SULFATE (5 MG/ML) 0.5% IN NEBU
2.5000 mg | INHALATION_SOLUTION | Freq: Once | RESPIRATORY_TRACT | Status: AC
  Administered 2013-02-16: 2.5 mg via RESPIRATORY_TRACT
  Filled 2013-02-16: qty 0.5

## 2013-02-16 MED ORDER — PREDNISONE 20 MG PO TABS
60.0000 mg | ORAL_TABLET | Freq: Once | ORAL | Status: AC
  Administered 2013-02-16: 60 mg via ORAL
  Filled 2013-02-16: qty 3

## 2013-02-16 NOTE — ED Notes (Addendum)
H/o asthma. Smoker. C/o sob, cough & wheezing. Frequent persistant cough. Unable to lie down, bracing R ribs with cough. Coughed up "spit" PTA. Denies fever. Mentions cold sweat. Wants a pill to help her quit smoking. Also HA. Last cxr and prednisone was at last visit in December. Never intubated.

## 2013-02-16 NOTE — ED Provider Notes (Signed)
History     CSN: [id number]  Arrival date & time 02/16/13  2114   First MD Initiated Contact with Patient 02/16/13 2135      Chief Complaint  Patient presents with  . Cough  . Wheezing    (Consider location/radiation/quality/duration/timing/severity/associated sxs/prior treatment) HPI Comments: 4 y F with PMH of HTN, tobacco abuse and COPD here for wheezing and coughing with associated chest pain.  Pain located on the right lower rib cage and only with coughing.  She was started on albuterol from triage  Patient is a 62 y.o. female presenting with wheezing. The history is provided by the patient.  Wheezing Severity:  Moderate Severity compared to prior episodes:  Similar Onset quality:  Gradual Timing:  Constant Progression:  Worsening Chronicity:  Recurrent Relieved by:  Home nebulizer Associated symptoms: cough   Associated symptoms: no fever   Cough:    Cough characteristics:  Non-productive   Severity:  Moderate   Timing:  Intermittent   Progression:  Worsening   Chronicity:  Recurrent   Past Medical History  Diagnosis Date  . Hyperlipidemia   . Hypertension   . Tobacco abuse   . COPD (chronic obstructive pulmonary disease)     History of multiple hospital admissions for exercabation   . Asthma   . Breast cancer 1991    s/p lumpectomy, chemotherapy and radiation therapy in 1991. Mammogram in 2007 was normal.  . Sigmoid diverticulitis 80/2008  . Anxiety   . Depression   . Obesity   . GERD (gastroesophageal reflux disease)   . Heart murmur 10/05/11    "first time I ever heard I had one was today"  . Pneumonia   . Shortness of breath 10/05/11    "at rest; lying down; w/exertion"  . Diabetes mellitus   . Bronchitis     h/o  . Diarrhea     h/o  . Constipated     h/o    Past Surgical History  Procedure Laterality Date  . Dobutamine stress echo  08/2004    Inferior ischemia, normal LV systolic function, no significant CAD  . Abdominal hysterectomy     . Breast surgery  1991    lumphectomy right breast  . Neck surgery  2012    "Dr. [name]  put plate in; did something to my vertebrae"  . Cardiac catheterization      left cardiac cath 08/2004 normal systolic function, no CAD    Family History  Problem Relation Age of Onset  . Cancer Mother     History  Substance Use Topics  . Smoking status: Current Some Day Smoker -- 0.40 packs/day for 40 years    Types: Cigarettes  . Smokeless tobacco: Never Used     Comment: given QUIT line info - husband states he will quit when she does  . Alcohol Use: 2.4 oz/week    4 Cans of beer per week     Comment: "only on the weekends"    OB History   Grav Para Term Preterm Abortions TAB SAB Ect Mult Living                  Review of Systems  Constitutional: Negative for fever and chills.  Respiratory: Positive for cough and wheezing.   All other systems reviewed and are negative.    Allergies  Review of patient's allergies indicates no known allergies.  Home Medications   Current Outpatient Rx  Name  Route  Sig  Dispense  Refill  .  albuterol (PROVENTIL HFA;VENTOLIN HFA) 108 (90 BASE) MCG/ACT inhaler   Inhalation   Inhale 2 puffs into the lungs every 4 (four) hours as needed for wheezing.   1 Inhaler   11   . albuterol (PROVENTIL) (2.5 MG/3ML) 0.083% nebulizer solution   Nebulization   Take 3 mLs (2.5 mg total) by nebulization every 4 (four) hours as needed. For shortness of breath   75 mL   3   . budesonide-formoterol (SYMBICORT) 80-4.5 MCG/ACT inhaler   Inhalation   Inhale 2 puffs into the lungs 2 (two) times daily.         . carvedilol (COREG) 6.25 MG tablet   Oral   Take 6.25 mg by mouth 2 (two) times daily with a meal.         . citalopram (CELEXA) 40 MG tablet   Oral   Take 1 tablet (40 mg total) by mouth every morning.   60 tablet   2   . fluconazole (DIFLUCAN) 100 MG tablet   Oral   Take 1 tablet (100 mg total) by mouth daily.   14 tablet   0   .  fluticasone (FLONASE) 50 MCG/ACT nasal spray   Nasal   Place 2 sprays into the nose daily as needed for allergies.         . fluticasone (FLONASE) 50 MCG/ACT nasal spray      USE 2 SPRAYS IN EACH NOSTRIL EVERY DAY   16 g   1   . guaiFENesin (MUCINEX) 600 MG 12 hr tablet   Oral   Take 1 tablet (600 mg total) by mouth 2 (two) times daily.         Marland Kitchen guaiFENesin (ROBITUSSIN) 100 MG/5ML SOLN   Oral   Take 5 mLs (100 mg total) by mouth every 4 (four) hours as needed.   1200 mL      . lisinopril-hydrochlorothiazide (PRINZIDE,ZESTORETIC) 20-25 MG per tablet   Oral   Take 1 tablet by mouth every morning.         Marland Kitchen EXPIRED: loratadine (CLARITIN) 10 MG tablet   Oral   Take 1 tablet (10 mg total) by mouth daily.   30 tablet   3   . metFORMIN (GLUCOPHAGE) 1000 MG tablet   Oral   Take 1 tablet (1,000 mg total) by mouth 2 (two) times daily with a meal.   60 tablet   PRN   . pravastatin (PRAVACHOL) 40 MG tablet   Oral   Take 40 mg by mouth at bedtime.         . predniSONE (DELTASONE) 20 MG tablet      3 Tabs PO Days 1-3, then 2 tabs PO Days 4-6, then 1 tab PO Day 7-9, then Half Tab PO Day 10-12   20 tablet   0   . SPIRIVA HANDIHALER 18 MCG inhalation capsule      INHALE THE CONTENTS OF 1 CAPSULE VIA HANDIHALER BY MOUTH EVERY DAY   30 capsule   PRN   . tiotropium (SPIRIVA) 18 MCG inhalation capsule   Inhalation   Place 18 mcg into inhaler and inhale daily.         . traMADol (ULTRAM) 50 MG tablet   Oral   Take 1 tablet (50 mg total) by mouth every 8 (eight) hours as needed for pain.   40 tablet   0     BP 128/67  Pulse 98  Temp(Src) 98.5 F (36.9 C) (Oral)  Resp 20  Ht 5\' 5"  (1.651 m)  Wt 215 lb (97.523 kg)  BMI 35.78 kg/m2  SpO2 99%  Physical Exam  Constitutional: She is oriented to person, place, and time. She appears well-developed and well-nourished.  HENT:  Right Ear: External ear normal.  Left Ear: External ear normal.  Mouth/Throat: No  oropharyngeal exudate.  Eyes: Conjunctivae and EOM are normal. Pupils are equal, round, and reactive to light.  Neck: Normal range of motion. Neck supple.  Cardiovascular: Normal rate, regular rhythm, normal heart sounds and intact distal pulses.  Exam reveals no gallop and no friction rub.   No murmur heard. Pulmonary/Chest: She is in respiratory distress (mild). She has wheezes (all lung fields, end-exp). She exhibits tenderness (right lower chest wall).  Abdominal: Soft. Bowel sounds are normal. She exhibits no distension. There is no tenderness.  Musculoskeletal: Normal range of motion. She exhibits no edema.  Neurological: She is alert and oriented to person, place, and time. No cranial nerve deficit.  Skin: Skin is warm and dry. No rash noted.  Psychiatric: She has a normal mood and affect.    ED Course  Procedures (including critical care time)  Labs Reviewed - No data to display Dg Chest 2 View  02/16/2013  *RADIOLOGY REPORT*  Clinical Data: Cough and wheezing.  CHEST - 2 VIEW  Comparison: 12/20/2012  Findings: Pulmonary hyperinflation again seen, consistent with COPD.  No evidence of pulmonary infiltrate or edema.  No evidence of pleural effusion.  Heart size is within normal limits.  No mass or lymphadenopathy identified.  Surgical clips seen in the right axilla.  Anterior fixation plate and screws are also seen at the cervical thoracic junction.  IMPRESSION: COPD.  No active disease.   Original Report Authenticated By: Myles Rosenthal, M.D.      1. COPD exacerbation   2. Coughing   3. Acute chest wall pain   4. Wheezing       MDM    64 y F with PMH of HTN, tobacco abuse and COPD here for wheezing and coughing with associated chest pain.  Pain located on the right lower rib cage and only with coughing.  She was started on albuterol from triage and wheezing still evident on my exam.  Cough not productive of sputum.  AFVSS.  Well appearing.  Wheezing in all lung fields.  Likely  COPD exacerbation.  Will obtain CXR to r/o PNA.  Duoneb once albuterol complete.  11:15 PM Pt feels better after treatments.  CXR without PNA.  Prednisone given, Rx for same x4days.  Pt also given Rx for Norco given how much pain she was having with coughing.  Rx for Claritin, as well, as it is felt that environmental allergies may be at play.  Given no fevers or change in sputum, no indication for Abx.  Pt counseled on smoking cesssation.  Return precautions reviewed.  It is felt the pt is stable for d/c with close PCP f/u.  All questions answered and patient expressed understanding.  Disposition: Discharge  Condition: Good  Pt seen in conjunction with my attending, Dr. Deretha Emory.   Oleh Genin, MD PGY-II [facility] Emergency Medicine Resident        Oleh Genin, MD 02/17/13 [phone]

## 2013-02-16 NOTE — ED Provider Notes (Signed)
I saw and evaluated the patient, reviewed the resident's note and I agree with the findings and plan.  Patient has a history of COPD periods been exacerbated by the pollen recently significant cough with some chest wall pain. Wheezing resolved here in the emergency department with treatment. Patient has albuterol at home. Will also be treated with hydrocodone for the chest wall pain and prednisone. Patient will continue use albuterol inhaler and recommend that she sleeps sitting up. Chest x-ray shows no evidence of pulmonary edema.    Shelda Jakes, MD 02/16/13 [phone]

## 2013-02-17 NOTE — ED Provider Notes (Signed)
I saw and evaluated the patient, reviewed the resident's note and I agree with the findings and plan.   Chaka Boyson W. Natsuko Kelsay, MD 02/17/13 2010 

## 2013-03-08 ENCOUNTER — Other Ambulatory Visit (HOSPITAL_COMMUNITY): Payer: Self-pay | Admitting: Internal Medicine

## 2013-03-08 DIAGNOSIS — J449 Chronic obstructive pulmonary disease, unspecified: Secondary | ICD-10-CM

## 2013-03-13 ENCOUNTER — Other Ambulatory Visit: Payer: Self-pay | Admitting: Internal Medicine

## 2013-03-13 DIAGNOSIS — I10 Essential (primary) hypertension: Secondary | ICD-10-CM

## 2013-03-13 DIAGNOSIS — J309 Allergic rhinitis, unspecified: Secondary | ICD-10-CM

## 2013-03-13 DIAGNOSIS — E785 Hyperlipidemia, unspecified: Secondary | ICD-10-CM

## 2013-03-13 DIAGNOSIS — E119 Type 2 diabetes mellitus without complications: Secondary | ICD-10-CM

## 2013-05-17 ENCOUNTER — Other Ambulatory Visit: Payer: Self-pay

## 2013-07-06 ENCOUNTER — Other Ambulatory Visit: Payer: Self-pay | Admitting: *Deleted

## 2013-07-06 DIAGNOSIS — F329 Major depressive disorder, single episode, unspecified: Secondary | ICD-10-CM

## 2013-07-08 MED ORDER — CITALOPRAM HYDROBROMIDE 40 MG PO TABS: 40.0000 mg | ORAL_TABLET | Freq: Every morning | ORAL | Status: DC

## 2013-08-03 ENCOUNTER — Other Ambulatory Visit: Payer: Self-pay | Admitting: *Deleted

## 2013-08-03 DIAGNOSIS — E785 Hyperlipidemia, unspecified: Secondary | ICD-10-CM

## 2013-08-05 MED ORDER — PRAVASTATIN SODIUM 40 MG PO TABS: 40.0000 mg | ORAL_TABLET | Freq: Every day | ORAL | Status: DC

## 2013-08-05 NOTE — Telephone Encounter (Signed)
Pt needs to make an appointment with me.

## 2013-08-07 ENCOUNTER — Encounter: Payer: Self-pay | Admitting: Internal Medicine

## 2013-08-31 ENCOUNTER — Other Ambulatory Visit: Payer: Self-pay | Admitting: *Deleted

## 2013-09-03 MED ORDER — CARVEDILOL 6.25 MG PO TABS: 6.2500 mg | ORAL_TABLET | Freq: Two times a day (BID) | ORAL | Status: DC

## 2013-09-03 MED ORDER — METFORMIN HCL 500 MG PO TABS: 500.0000 mg | ORAL_TABLET | Freq: Two times a day (BID) | ORAL | Status: DC

## 2013-09-03 MED ORDER — LISINOPRIL-HYDROCHLOROTHIAZIDE 20-25 MG PO TABS: 1.0000 | ORAL_TABLET | Freq: Every morning | ORAL | Status: DC

## 2013-11-22 DIAGNOSIS — L608 Other nail disorders: Secondary | ICD-10-CM | POA: Diagnosis not present

## 2013-11-22 DIAGNOSIS — E1149 Type 2 diabetes mellitus with other diabetic neurological complication: Secondary | ICD-10-CM | POA: Diagnosis not present

## 2013-11-30 ENCOUNTER — Emergency Department (INDEPENDENT_AMBULATORY_CARE_PROVIDER_SITE_OTHER)
Admission: EM | Admit: 2013-11-30 | Discharge: 2013-11-30 | Disposition: A | Discharge status: Home / Self Care | Payer: Medicare Other | Source: Home / Self Care | Attending: Family Medicine | Admitting: Family Medicine

## 2013-11-30 ENCOUNTER — Emergency Department (INDEPENDENT_AMBULATORY_CARE_PROVIDER_SITE_OTHER): Payer: Medicare Other

## 2013-11-30 ENCOUNTER — Encounter (HOSPITAL_COMMUNITY): Payer: Self-pay | Admitting: Emergency Medicine

## 2013-11-30 DIAGNOSIS — J069 Acute upper respiratory infection, unspecified: Secondary | ICD-10-CM

## 2013-11-30 DIAGNOSIS — J441 Chronic obstructive pulmonary disease with (acute) exacerbation: Secondary | ICD-10-CM

## 2013-11-30 DIAGNOSIS — R0989 Other specified symptoms and signs involving the circulatory and respiratory systems: Secondary | ICD-10-CM | POA: Diagnosis not present

## 2013-11-30 MED ORDER — LEVOFLOXACIN 500 MG PO TABS: 500.0000 mg | ORAL_TABLET | Freq: Every day | ORAL | Status: DC

## 2013-11-30 MED ORDER — PREDNISONE 50 MG PO TABS: 50.0000 mg | ORAL_TABLET | Freq: Every day | ORAL | Status: DC

## 2013-11-30 MED ORDER — DEXAMETHASONE SODIUM PHOSPHATE 10 MG/ML IJ SOLN
10.0000 mg | Freq: Once | INTRAMUSCULAR | Status: AC
  Administered 2013-11-30: 10 mg via INTRAMUSCULAR

## 2013-11-30 MED ORDER — HYDROCODONE-HOMATROPINE 5-1.5 MG/5ML PO SYRP: 5.0000 mL | ORAL_SOLUTION | Freq: Four times a day (QID) | ORAL | Status: DC | PRN

## 2013-11-30 MED ORDER — DEXAMETHASONE SODIUM PHOSPHATE 10 MG/ML IJ SOLN
INTRAMUSCULAR | Status: AC
  Filled 2013-11-30: qty 1

## 2013-11-30 NOTE — Discharge Instructions (Signed)
Chronic Obstructive Pulmonary Disease Exacerbation °Chronic obstructive pulmonary disease (COPD) is a common lung condition in which airflow from the lungs is limited. COPD is a general term that can be used to describe many different lung problems that limit airflow, including chronic bronchitis and emphysema. COPD exacerbations are episodes when breathing symptoms become much worse and require extra treatment. Without treatment, COPD exacerbations can be life threatening, and frequent COPD exacerbations can cause further damage to your lungs. °CAUSES  °· Respiratory infections.   °· Exposure to smoke.   °· Exposure to air pollution, chemical fumes, or dust. °Sometimes there is no apparent cause or trigger. °RISK FACTORS °· Smoking cigarettes. °· Older age. °· Frequent prior COPD exacerbations. °SIGNS AND SYMPTOMS  °· Increased coughing.   °· Increased thick spit (sputum) production.   °· Increased wheezing.   °· Increased shortness of breath.   °· Rapid breathing.   °· Chest tightness. °DIAGNOSIS  °Your medical history, a physical exam, and tests will help your health care provider make a diagnosis. Tests may include: °· A chest X-ray. °· Basic lab tests. °· Sputum testing. °· An arterial blood gas test. °TREATMENT  °Depending on the severity of your COPD exacerbation, you may need to be admitted to a hospital for treatment. Some of the treatments commonly used to treat COPD exacerbations are:  °· Antibiotic medicines.   °· Bronchodilators. These are drugs that expand the air passages. They may be given with an inhaler or nebulizer. Spacer devices may be needed to help improve drug delivery. °· Corticosteroid medicines. °· Supplemental oxygen therapy.   °HOME CARE INSTRUCTIONS  °· Do not smoke. Quitting smoking is very important to prevent COPD from getting worse and exacerbations from happening as often. °· Avoid exposure to all substances that irritate the airway, especially to tobacco smoke.   °· If prescribed,  take your antibiotics as directed. Finish them even if you start to feel better. °· Only take over-the-counter or prescription medicines as directed by your health care provider. It is important to use correct technique with inhaled medicines. °· Drink enough fluids to keep your urine clear or pale yellow (unless you have a medical condition that requires fluid restriction). °· Use a cool mist vaporizer. This makes it easier to clear your chest when you cough.   °· If you have a home nebulizer and oxygen, continue to use them as directed.   °· Maintain all necessary vaccinations to prevent infections.   °· Exercise regularly.   °· Eat a healthy diet.   °· Keep all follow-up appointments as directed by your health care provider. °SEEK IMMEDIATE MEDICAL CARE IF: °· You have worsening shortness of breath.   °· You have trouble talking.   °· You have severe chest pain. °· You have blood in your sputum.  °· You have a fever. °· You have weakness, vomit repeatedly, or faint.   °· You feel confused.   °· You continue to get worse. °MAKE SURE YOU:  °· Understand these instructions. °· Will watch your condition. °· Will get help right away if you are not doing well or get worse. °Document Released: 08/22/2007 Document Revised: 08/15/2013 Document Reviewed: 06/29/2013 °[employer]® Patient Information ©2014 [employer]. ° °

## 2013-11-30 NOTE — ED Provider Notes (Signed)
CSN: [id number]     Arrival date & time 11/30/13  0808 History   First MD Initiated Contact with Patient 11/30/13 0830     Chief Complaint  Patient presents with  . URI   (Consider location/radiation/quality/duration/timing/severity/associated sxs/prior Treatment) HPI Comments: 6f with Hx COPD with cough, SOB, fatigue, worsening for 2 weeks.  She is taking numerous OTCs without relief.  She has been using her nebulizer at home which does help, this was last used about 2 hours ago. She says she has thought this as long as she can and she thinks she probably needs an antibiotic and a steroid. She has also noted a lump on the dorsal surface of her left wrist 3 days ago. It does not hurt. She's never had this before. No injury to the wrist.  Patient is a 63 y.o. female presenting with URI.  URI Presenting symptoms: cough and fatigue   Presenting symptoms: no fever   Associated symptoms: no arthralgias and no myalgias     Past Medical History  Diagnosis Date  . Hyperlipidemia   . Hypertension   . Tobacco abuse   . COPD (chronic obstructive pulmonary disease)     History of multiple hospital admissions for exercabation   . Asthma   . Breast cancer 1991    s/p lumpectomy, chemotherapy and radiation therapy in 1991. Mammogram in 2007 was normal.  . Sigmoid diverticulitis 80/2008  . Anxiety   . Depression   . Obesity   . GERD (gastroesophageal reflux disease)   . Heart murmur 10/05/11    "first time I ever heard I had one was today"  . Pneumonia   . Shortness of breath 10/05/11    "at rest; lying down; w/exertion"  . Diabetes mellitus   . Bronchitis     h/o  . Diarrhea     h/o  . Constipated     h/o   Past Surgical History  Procedure Laterality Date  . Dobutamine stress echo  08/2004    Inferior ischemia, normal LV systolic function, no significant CAD  . Abdominal hysterectomy    . Breast surgery  1991    lumphectomy right breast  . Neck surgery  2012    "Dr. Lynann Bologna   put plate in; did something to my vertebrae"  . Cardiac catheterization      left cardiac cath 95/6387 normal systolic function, no CAD   Family History  Problem Relation Age of Onset  . Cancer Mother    History  Substance Use Topics  . Smoking status: Current Some Day Smoker -- 0.40 packs/day for 40 years    Types: Cigarettes  . Smokeless tobacco: Never Used     Comment: given QUIT line info - husband states he will quit when she does  . Alcohol Use: 2.4 oz/week    4 Cans of beer per week     Comment: "only on the weekends"   OB History   Grav Para Term Preterm Abortions TAB SAB Ect Mult Living                 Review of Systems  Constitutional: Positive for fatigue. Negative for fever and chills.  Eyes: Negative for visual disturbance.  Respiratory: Positive for cough, chest tightness and shortness of breath.   Cardiovascular: Negative for chest pain, palpitations and leg swelling.  Gastrointestinal: Negative for nausea, vomiting and abdominal pain.  Endocrine: Negative for polydipsia and polyuria.  Genitourinary: Negative for dysuria, urgency and frequency.  Musculoskeletal: Negative  for arthralgias and myalgias.  Skin: Negative for rash.  Neurological: Negative for dizziness, weakness and light-headedness.    Allergies  Review of patient's allergies indicates no known allergies.  Home Medications   Current Outpatient Rx  Name  Route  Sig  Dispense  Refill  . EXPIRED: albuterol (PROVENTIL HFA;VENTOLIN HFA) 108 (90 BASE) MCG/ACT inhaler   Inhalation   Inhale 2 puffs into the lungs every 4 (four) hours as needed for wheezing.   1 Inhaler   11   . albuterol (PROVENTIL) (2.5 MG/3ML) 0.083% nebulizer solution      TAKE 3 MLS (2.5 MG TOTAL) BY NEBULIZATION EVERY 4 (FOUR) HOURS AS NEEDED FOR SHORTNESS OF BREATH   3 mL   10   . budesonide-formoterol (SYMBICORT) 80-4.5 MCG/ACT inhaler   Inhalation   Inhale 2 puffs into the lungs 2 (two) times daily as needed. For  asthma         . carvedilol (COREG) 6.25 MG tablet      TAKE 1 TABLET BY MOUTH TWICE DAILY   60 tablet   3   . carvedilol (COREG) 6.25 MG tablet   Oral   Take 1 tablet (6.25 mg total) by mouth 2 (two) times daily with a meal.   60 tablet   3   . citalopram (CELEXA) 40 MG tablet   Oral   Take 1 tablet (40 mg total) by mouth every morning.   90 tablet   1   . fluticasone (FLONASE) 50 MCG/ACT nasal spray   Nasal   Place 2 sprays into the nose daily as needed for allergies.         . fluticasone (FLONASE) 50 MCG/ACT nasal spray      USE 2 SPRAYS IN EACH NOSTRIL EVERY DAY   16 g   3   . HYDROcodone-acetaminophen (NORCO/VICODIN) 5-325 MG per tablet   Oral   Take 1 tablet by mouth every 4 (four) hours as needed for pain.   20 tablet   0   . HYDROcodone-homatropine (HYCODAN) 5-1.5 MG/5ML syrup   Oral   Take 5 mLs by mouth every 6 (six) hours as needed for cough.   120 mL   0   . levofloxacin (LEVAQUIN) 500 MG tablet   Oral   Take 1 tablet (500 mg total) by mouth daily.   7 tablet   0   . lisinopril-hydrochlorothiazide (PRINZIDE,ZESTORETIC) 20-25 MG per tablet      TAKE 1 TABLET BY MOUTH DAILY   30 tablet   3   . lisinopril-hydrochlorothiazide (PRINZIDE,ZESTORETIC) 20-25 MG per tablet   Oral   Take 1 tablet by mouth every morning.   30 tablet   3   . loratadine (CLARITIN) 10 MG tablet   Oral   Take 1 tablet (10 mg total) by mouth daily.   30 tablet   1   . metFORMIN (GLUCOPHAGE) 500 MG tablet      TAKE 1 TABLET BY MOUTH TWICE DAILY WITH MEAL   60 tablet   3   . metFORMIN (GLUCOPHAGE) 500 MG tablet   Oral   Take 1 tablet (500 mg total) by mouth 2 (two) times daily with a meal.   60 tablet   3   . pravastatin (PRAVACHOL) 40 MG tablet   Oral   Take 40 mg by mouth at bedtime.         . pravastatin (PRAVACHOL) 40 MG tablet   Oral   Take 1 tablet (40 mg  total) by mouth daily.   90 tablet   4   . predniSONE (DELTASONE) 50 MG tablet    Oral   Take 1 tablet (50 mg total) by mouth daily.   5 tablet   0   . VENTOLIN HFA 108 (90 BASE) MCG/ACT inhaler      INHALE 2 PUFFS INTO THE LUNGS EVERY 6 (SIX) HOURS AS NEEDED FOR WHEEZING.   18 g   3    BP 147/84  Pulse 96  Temp(Src) 98.3 F (36.8 C) (Oral)  Resp 18  SpO2 99% Physical Exam  Nursing note and vitals reviewed. Constitutional: She is oriented to person, place, and time. Vital signs are normal. She appears well-developed and well-nourished. No distress.  HENT:  Head: Normocephalic and atraumatic.  Right Ear: External ear normal.  Left Ear: External ear normal.  Nose: Nose normal.  Mouth/Throat: Oropharynx is clear and moist. No oropharyngeal exudate.  Eyes: Conjunctivae are normal. Right eye exhibits no discharge. Left eye exhibits no discharge.  Neck: Normal range of motion. Neck supple.  Cardiovascular: Normal rate and regular rhythm.  Exam reveals no gallop and no friction rub.   Murmur (2-7/2 systolic decrescendo) heard. Pulmonary/Chest: Effort normal and breath sounds normal. No respiratory distress. She has no wheezes. She has no rales.  Lymphadenopathy:    She has no cervical adenopathy.  Neurological: She is alert and oriented to person, place, and time. She has normal strength. Coordination normal.  Skin: Skin is warm and dry. No rash noted. She is not diaphoretic.  Psychiatric: She has a normal mood and affect. Judgment normal.    ED Course  Procedures (including critical care time) Labs Review Labs Reviewed - No data to display Imaging Review Dg Chest 2 View  11/30/2013   CLINICAL DATA:  Cough, congestion  EXAM: CHEST  2 VIEW  COMPARISON:  02/16/2013  FINDINGS: Postop changes in the right axillary region and of the lower cervical spine. Stable mild hyperinflation. Normal heart size and vascularity. No focal pneumonia, edema, collapse or consolidation. No effusion or pneumothorax. Stable exam.  IMPRESSION: Mild hyperinflation.  No interval change  or acute process.   Electronically Signed   By: Daryll Brod M.D.   On: 11/30/2013 09:03      MDM   1. COPD exacerbation   2. URI (upper respiratory infection)    VS normal, afebrile, with normal CXR.  Will treat as outpatient, f/u if any worsening    Meds ordered this encounter  Medications  . dexamethasone (DECADRON) injection 10 mg    Sig:   . predniSONE (DELTASONE) 50 MG tablet    Sig: Take 1 tablet (50 mg total) by mouth daily.    Dispense:  5 tablet    Refill:  0    Order Specific Question:  Supervising Provider    Answer:  Billy Fischer [phone]  . levofloxacin (LEVAQUIN) 500 MG tablet    Sig: Take 1 tablet (500 mg total) by mouth daily.    Dispense:  7 tablet    Refill:  0    Order Specific Question:  Supervising Provider    Answer:  Billy Fischer [phone]  . HYDROcodone-homatropine (HYCODAN) 5-1.5 MG/5ML syrup    Sig: Take 5 mLs by mouth every 6 (six) hours as needed for cough.    Dispense:  120 mL    Refill:  0    Order Specific Question:  Supervising Provider    Answer:  Ihor Gully D [phone]  Liam Graham, PA-C 11/30/13 [phone]

## 2013-11-30 NOTE — ED Notes (Signed)
Pt c/o cold sxs onset 2 weeks Sxs include: cough, BA, chest d/c due to cough, SOB, wheezing Denies: f/v/n/d... Had an albuterol tx today at 6am Also c/o a cyst anterior part of wrist onset 3 days Alert w/no signs of acute distress.

## 2013-11-30 NOTE — ED Provider Notes (Signed)
Medical screening examination/treatment/procedure(s) were performed by resident physician or non-physician practitioner and as supervising physician I was immediately available for consultation/collaboration.   Pauline Good MD.   Billy Fischer, MD 11/30/13 [phone]

## 2014-01-24 ENCOUNTER — Encounter: Payer: Self-pay | Admitting: Internal Medicine

## 2014-02-19 ENCOUNTER — Other Ambulatory Visit: Payer: Self-pay | Admitting: Internal Medicine

## 2014-02-19 DIAGNOSIS — J449 Chronic obstructive pulmonary disease, unspecified: Secondary | ICD-10-CM

## 2014-02-19 MED ORDER — ALBUTEROL SULFATE (2.5 MG/3ML) 0.083% IN NEBU: 2.5000 mg | INHALATION_SOLUTION | RESPIRATORY_TRACT | Status: DC | PRN

## 2014-02-19 MED ORDER — ALBUTEROL SULFATE HFA 108 (90 BASE) MCG/ACT IN AERS
2.0000 | INHALATION_SPRAY | Freq: Four times a day (QID) | RESPIRATORY_TRACT | Status: DC | PRN
Start: 2014-02-19 — End: 2014-03-12

## 2014-03-08 ENCOUNTER — Inpatient Hospital Stay (HOSPITAL_COMMUNITY)
Admission: EM | Admit: 2014-03-08 | Discharge: 2014-03-12 | DRG: 189 | Disposition: A | Discharge status: Home / Self Care | Payer: Medicare Other | Attending: Internal Medicine | Admitting: Internal Medicine

## 2014-03-08 ENCOUNTER — Emergency Department (HOSPITAL_COMMUNITY): Payer: Medicare Other

## 2014-03-08 ENCOUNTER — Encounter (HOSPITAL_COMMUNITY): Payer: Self-pay | Admitting: Emergency Medicine

## 2014-03-08 DIAGNOSIS — F411 Generalized anxiety disorder: Secondary | ICD-10-CM | POA: Diagnosis present

## 2014-03-08 DIAGNOSIS — K219 Gastro-esophageal reflux disease without esophagitis: Secondary | ICD-10-CM | POA: Diagnosis present

## 2014-03-08 DIAGNOSIS — E785 Hyperlipidemia, unspecified: Secondary | ICD-10-CM | POA: Diagnosis present

## 2014-03-08 DIAGNOSIS — J9600 Acute respiratory failure, unspecified whether with hypoxia or hypercapnia: Secondary | ICD-10-CM | POA: Diagnosis present

## 2014-03-08 DIAGNOSIS — J45901 Unspecified asthma with (acute) exacerbation: Secondary | ICD-10-CM

## 2014-03-08 DIAGNOSIS — F17200 Nicotine dependence, unspecified, uncomplicated: Secondary | ICD-10-CM | POA: Diagnosis not present

## 2014-03-08 DIAGNOSIS — J440 Chronic obstructive pulmonary disease with acute lower respiratory infection: Secondary | ICD-10-CM

## 2014-03-08 DIAGNOSIS — E119 Type 2 diabetes mellitus without complications: Secondary | ICD-10-CM

## 2014-03-08 DIAGNOSIS — R0609 Other forms of dyspnea: Secondary | ICD-10-CM | POA: Diagnosis not present

## 2014-03-08 DIAGNOSIS — R0603 Acute respiratory distress: Secondary | ICD-10-CM

## 2014-03-08 DIAGNOSIS — F3289 Other specified depressive episodes: Secondary | ICD-10-CM | POA: Diagnosis present

## 2014-03-08 DIAGNOSIS — E669 Obesity, unspecified: Secondary | ICD-10-CM | POA: Diagnosis present

## 2014-03-08 DIAGNOSIS — F329 Major depressive disorder, single episode, unspecified: Secondary | ICD-10-CM | POA: Diagnosis present

## 2014-03-08 DIAGNOSIS — J984 Other disorders of lung: Secondary | ICD-10-CM | POA: Diagnosis not present

## 2014-03-08 DIAGNOSIS — R0602 Shortness of breath: Secondary | ICD-10-CM | POA: Diagnosis not present

## 2014-03-08 DIAGNOSIS — J9690 Respiratory failure, unspecified, unspecified whether with hypoxia or hypercapnia: Secondary | ICD-10-CM

## 2014-03-08 DIAGNOSIS — J441 Chronic obstructive pulmonary disease with (acute) exacerbation: Secondary | ICD-10-CM | POA: Diagnosis present

## 2014-03-08 DIAGNOSIS — I10 Essential (primary) hypertension: Secondary | ICD-10-CM | POA: Diagnosis not present

## 2014-03-08 DIAGNOSIS — Z853 Personal history of malignant neoplasm of breast: Secondary | ICD-10-CM

## 2014-03-08 DIAGNOSIS — R0989 Other specified symptoms and signs involving the circulatory and respiratory systems: Secondary | ICD-10-CM | POA: Diagnosis not present

## 2014-03-08 DIAGNOSIS — J449 Chronic obstructive pulmonary disease, unspecified: Secondary | ICD-10-CM

## 2014-03-08 DIAGNOSIS — R059 Cough, unspecified: Secondary | ICD-10-CM | POA: Diagnosis not present

## 2014-03-08 DIAGNOSIS — E1122 Type 2 diabetes mellitus with diabetic chronic kidney disease: Secondary | ICD-10-CM | POA: Diagnosis present

## 2014-03-08 DIAGNOSIS — Z720 Tobacco use: Secondary | ICD-10-CM | POA: Diagnosis present

## 2014-03-08 LAB — COMPREHENSIVE METABOLIC PANEL
ALBUMIN: 3.8 g/dL (ref 3.5–5.2)
ALK PHOS: 106 U/L (ref 39–117)
ALT: 8 U/L (ref 0–35)
AST: 18 U/L (ref 0–37)
BILIRUBIN TOTAL: 0.3 mg/dL (ref 0.3–1.2)
BUN: 8 mg/dL (ref 6–23)
CHLORIDE: 98 meq/L (ref 96–112)
CO2: 26 mEq/L (ref 19–32)
Calcium: 9.4 mg/dL (ref 8.4–10.5)
Creatinine, Ser: 0.71 mg/dL (ref 0.50–1.10)
GFR calc Af Amer: >90 mL/min (ref >90)
GFR calc non Af Amer: 90 mL/min — ABNORMAL LOW (ref >90)
Glucose, Bld: 102 mg/dL — ABNORMAL HIGH (ref 70–99)
POTASSIUM: 3.6 meq/L — AB (ref 3.7–5.3)
SODIUM: 140 meq/L (ref 137–147)
Total Protein: 7.9 g/dL (ref 6.0–8.3)

## 2014-03-08 LAB — GLUCOSE, CAPILLARY
GLUCOSE-CAPILLARY: 236 mg/dL — AB (ref 70–99)
GLUCOSE-CAPILLARY: 349 mg/dL — AB (ref 70–99)
GLUCOSE-CAPILLARY: 362 mg/dL — AB (ref 70–99)

## 2014-03-08 LAB — CBC
HCT: 36 % (ref 36.0–46.0)
HEMOGLOBIN: 12.3 g/dL (ref 12.0–15.0)
MCH: 29.1 pg (ref 26.0–34.0)
MCHC: 34.2 g/dL (ref 30.0–36.0)
MCV: 85.3 fL (ref 78.0–100.0)
PLATELETS: 341 10*3/uL (ref 150–400)
RBC: 4.22 MIL/uL (ref 3.87–5.11)
RDW: 12.9 % (ref 11.5–15.5)
WBC: 5.6 10*3/uL (ref 4.0–10.5)

## 2014-03-08 LAB — I-STAT TROPONIN, ED: Troponin i, poc: 0 ng/mL (ref 0.00–0.08)

## 2014-03-08 LAB — PRO B NATRIURETIC PEPTIDE: PRO B NATRI PEPTIDE: 51.2 pg/mL (ref 0–125)

## 2014-03-08 LAB — CBG MONITORING, ED: Glucose-Capillary: 169 mg/dL — ABNORMAL HIGH (ref 70–99)

## 2014-03-08 MED ORDER — MORPHINE SULFATE 2 MG/ML IJ SOLN
1.0000 mg | INTRAMUSCULAR | Status: DC | PRN
  Administered 2014-03-08 – 2014-03-11 (×7): 1 mg via INTRAVENOUS
  Filled 2014-03-08 (×7): qty 1

## 2014-03-08 MED ORDER — GUAIFENESIN-DM 100-10 MG/5ML PO SYRP
5.0000 mL | ORAL_SOLUTION | ORAL | Status: DC | PRN
  Administered 2014-03-08 – 2014-03-12 (×3): 5 mL via ORAL
  Filled 2014-03-08 (×3): qty 5

## 2014-03-08 MED ORDER — IPRATROPIUM BROMIDE 0.02 % IN SOLN
0.5000 mg | Freq: Once | RESPIRATORY_TRACT | Status: AC
  Administered 2014-03-08: 0.5 mg via RESPIRATORY_TRACT
  Filled 2014-03-08: qty 2.5

## 2014-03-08 MED ORDER — IPRATROPIUM-ALBUTEROL 0.5-2.5 (3) MG/3ML IN SOLN
3.0000 mL | RESPIRATORY_TRACT | Status: DC
  Administered 2014-03-08 (×4): 3 mL via RESPIRATORY_TRACT
  Filled 2014-03-08 (×3): qty 3

## 2014-03-08 MED ORDER — OXYCODONE HCL 5 MG PO TABS
5.0000 mg | ORAL_TABLET | ORAL | Status: DC | PRN
Start: 2014-03-08 — End: 2014-03-12
  Administered 2014-03-09 – 2014-03-12 (×11): 5 mg via ORAL
  Filled 2014-03-08 (×11): qty 1

## 2014-03-08 MED ORDER — ACETAMINOPHEN 650 MG RE SUPP
650.0000 mg | Freq: Four times a day (QID) | RECTAL | Status: DC | PRN
Start: 2014-03-08 — End: 2014-03-12

## 2014-03-08 MED ORDER — MORPHINE SULFATE 2 MG/ML IJ SOLN
2.0000 mg | Freq: Once | INTRAMUSCULAR | Status: AC
  Administered 2014-03-08: 2 mg via INTRAVENOUS
  Filled 2014-03-08: qty 1

## 2014-03-08 MED ORDER — ONDANSETRON HCL 4 MG PO TABS: 4.0000 mg | ORAL_TABLET | Freq: Four times a day (QID) | ORAL | Status: DC | PRN

## 2014-03-08 MED ORDER — SODIUM CHLORIDE 0.9 % IV SOLN
INTRAVENOUS | Status: DC
  Administered 2014-03-08 – 2014-03-09 (×2): via INTRAVENOUS

## 2014-03-08 MED ORDER — SODIUM CHLORIDE 0.9 % IJ SOLN
3.0000 mL | Freq: Two times a day (BID) | INTRAMUSCULAR | Status: DC
Start: 2014-03-08 — End: 2014-03-12
  Administered 2014-03-08 – 2014-03-12 (×8): 3 mL via INTRAVENOUS

## 2014-03-08 MED ORDER — LISINOPRIL 10 MG PO TABS
10.0000 mg | ORAL_TABLET | Freq: Every day | ORAL | Status: DC
  Administered 2014-03-08 – 2014-03-11 (×4): 10 mg via ORAL
  Filled 2014-03-08 (×5): qty 1

## 2014-03-08 MED ORDER — CARVEDILOL 6.25 MG PO TABS
6.2500 mg | ORAL_TABLET | Freq: Two times a day (BID) | ORAL | Status: DC
  Administered 2014-03-08 – 2014-03-12 (×8): 6.25 mg via ORAL
  Filled 2014-03-08 (×10): qty 1

## 2014-03-08 MED ORDER — ALBUTEROL SULFATE (2.5 MG/3ML) 0.083% IN NEBU
5.0000 mg | INHALATION_SOLUTION | Freq: Once | RESPIRATORY_TRACT | Status: AC
  Administered 2014-03-08: 5 mg via RESPIRATORY_TRACT
  Filled 2014-03-08: qty 6

## 2014-03-08 MED ORDER — INSULIN ASPART 100 UNIT/ML ~~LOC~~ SOLN
0.0000 [IU] | Freq: Every day | SUBCUTANEOUS | Status: DC
  Administered 2014-03-08: 5 [IU] via SUBCUTANEOUS
  Administered 2014-03-10: 2 [IU] via SUBCUTANEOUS
  Administered 2014-03-11: 4 [IU] via SUBCUTANEOUS

## 2014-03-08 MED ORDER — ACETAMINOPHEN 325 MG PO TABS: 650.0000 mg | ORAL_TABLET | Freq: Four times a day (QID) | ORAL | Status: DC | PRN

## 2014-03-08 MED ORDER — LEVOFLOXACIN IN D5W 750 MG/150ML IV SOLN
750.0000 mg | INTRAVENOUS | Status: DC
  Administered 2014-03-08: 750 mg via INTRAVENOUS
  Filled 2014-03-08 (×2): qty 150

## 2014-03-08 MED ORDER — METHYLPREDNISOLONE SODIUM SUCC 125 MG IJ SOLR
125.0000 mg | Freq: Once | INTRAMUSCULAR | Status: AC
Start: 2014-03-08 — End: 2014-03-08
  Administered 2014-03-08: 125 mg via INTRAVENOUS
  Filled 2014-03-08: qty 2

## 2014-03-08 MED ORDER — ALBUTEROL (5 MG/ML) CONTINUOUS INHALATION SOLN
10.0000 mg/h | INHALATION_SOLUTION | RESPIRATORY_TRACT | Status: DC
  Administered 2014-03-08: 10 mg/h via RESPIRATORY_TRACT
  Filled 2014-03-08: qty 20

## 2014-03-08 MED ORDER — ONDANSETRON HCL 4 MG/2ML IJ SOLN
4.0000 mg | Freq: Four times a day (QID) | INTRAMUSCULAR | Status: DC | PRN
  Administered 2014-03-08 – 2014-03-10 (×2): 4 mg via INTRAVENOUS
  Filled 2014-03-08 (×2): qty 2

## 2014-03-08 MED ORDER — ALBUTEROL SULFATE (2.5 MG/3ML) 0.083% IN NEBU
5.0000 mg | INHALATION_SOLUTION | Freq: Once | RESPIRATORY_TRACT | Status: AC
Start: 2014-03-08 — End: 2014-03-08
  Administered 2014-03-08: 5 mg via RESPIRATORY_TRACT

## 2014-03-08 MED ORDER — INSULIN ASPART 100 UNIT/ML ~~LOC~~ SOLN
0.0000 [IU] | Freq: Three times a day (TID) | SUBCUTANEOUS | Status: DC
  Administered 2014-03-08: 7 [IU] via SUBCUTANEOUS
  Administered 2014-03-08: 15 [IU] via SUBCUTANEOUS
  Administered 2014-03-09: 20 [IU] via SUBCUTANEOUS
  Administered 2014-03-09: 15 [IU] via SUBCUTANEOUS
  Administered 2014-03-09: 20 [IU] via SUBCUTANEOUS
  Administered 2014-03-10: 15 [IU] via SUBCUTANEOUS
  Administered 2014-03-10 – 2014-03-12 (×6): 11 [IU] via SUBCUTANEOUS

## 2014-03-08 MED ORDER — CITALOPRAM HYDROBROMIDE 40 MG PO TABS
40.0000 mg | ORAL_TABLET | Freq: Every day | ORAL | Status: DC
  Administered 2014-03-08 – 2014-03-10 (×3): 40 mg via ORAL
  Filled 2014-03-08 (×4): qty 1

## 2014-03-08 MED ORDER — ENOXAPARIN SODIUM 40 MG/0.4ML ~~LOC~~ SOLN
40.0000 mg | SUBCUTANEOUS | Status: DC
  Administered 2014-03-08 – 2014-03-12 (×5): 40 mg via SUBCUTANEOUS
  Filled 2014-03-08 (×5): qty 0.4

## 2014-03-08 MED ORDER — METHYLPREDNISOLONE SODIUM SUCC 125 MG IJ SOLR
60.0000 mg | Freq: Four times a day (QID) | INTRAMUSCULAR | Status: DC
  Administered 2014-03-08 – 2014-03-10 (×9): 60 mg via INTRAVENOUS
  Filled 2014-03-08 (×4): qty 0.96
  Filled 2014-03-08: qty 2
  Filled 2014-03-08 (×8): qty 0.96

## 2014-03-08 MED ORDER — SIMVASTATIN 10 MG PO TABS
10.0000 mg | ORAL_TABLET | Freq: Every day | ORAL | Status: DC
  Administered 2014-03-08 – 2014-03-11 (×4): 10 mg via ORAL
  Filled 2014-03-08 (×5): qty 1

## 2014-03-08 MED ORDER — ALUM & MAG HYDROXIDE-SIMETH 200-200-20 MG/5ML PO SUSP
30.0000 mL | Freq: Four times a day (QID) | ORAL | Status: DC | PRN
Start: 2014-03-08 — End: 2014-03-12

## 2014-03-08 MED ORDER — IPRATROPIUM-ALBUTEROL 0.5-2.5 (3) MG/3ML IN SOLN
3.0000 mL | Freq: Three times a day (TID) | RESPIRATORY_TRACT | Status: DC
  Administered 2014-03-09: 3 mL via RESPIRATORY_TRACT
  Filled 2014-03-08: qty 3

## 2014-03-08 NOTE — ED Notes (Signed)
Attempted to call report x 1  

## 2014-03-08 NOTE — Progress Notes (Signed)
Pt arrived to floor 93% on RA.  VSS. Denies pain. Pleasant.  Paged [facility] [phone] to advise pt arrived to floor. Pt tele SR on tele # 12, Paged central tele to advise pts on tele and tele box 12.

## 2014-03-08 NOTE — ED Notes (Signed)
Pt unable to complete full sentences, pt extremely SOB, pt has hx of asthma, pt reports she gave her self a breathing tx at home two hours ago and has had no relief. Pt in the tripod position. Breathing tx adm upon assessment.

## 2014-03-08 NOTE — H&P (Signed)
[facility] History and Physical  JARELIS EHLERT IRJ:[id number] DOB: 11-Sep-1951 DOA: 03/08/2014  Referring physician:  PCP: [name], MD   Chief Complaint: Shortness of breath, Cough, Wheezing  HPI: [name] is a 63 y.o. female  patient is a pleasant 63 year old female with a past medical history of tobacco abuse, chronic obstructive pulmonary disease/asthma, presented to the emergent apartment with complaints of shortness of breath, cough, and wheezing that has progressively worsened over the past several days. She reports symptoms started last Tuesday progressively worsening despite taking multiple inhalers. Unfortunately she continues to smoke as she has a 30-pack-year history of tobacco abuse. In the emergency department she was found to be in acute respiratory failure evidence by respiratory of 27, patient using accessory muscles and in respiratory distress. Symptoms improved with the demonstration of the steroids and nebulizers. She presently denies fevers, chills, dysuria, hematuria, diarrhea constipation                                                                      Review of Systems:  Constitutional:  No weight loss, night sweats, Fevers, chills, fatigue.  HEENT:  No headaches, Difficulty swallowing,Tooth/dental problems,Sore throat,  No sneezing, itching, ear ache, nasal congestion, post nasal drip,  Cardio-vascular:  No chest pain, Orthopnea, PND, swelling in lower extremities, anasarca, dizziness, palpitations  GI:  No heartburn, indigestion, abdominal pain, nausea, vomiting, diarrhea, change in bowel habits, loss of appetite  Resp:  Positive for shortness of breath with exertion or at rest. Positive for excess mucus,  productive cough. No non-productive cough, No coughing up of blood.No change in color of mucus.No wheezing.No chest wall deformity  Skin:  no rash or lesions.  GU:  no dysuria, change in color of urine, no urgency or frequency. No  flank pain.  Musculoskeletal:  No joint pain or swelling. No decreased range of motion. No back pain.  Psych:  No change in mood or affect. No depression or anxiety. No memory loss.   Past Medical History  Diagnosis Date  . Hyperlipidemia   . Hypertension   . Tobacco abuse   . COPD (chronic obstructive pulmonary disease)     History of multiple hospital admissions for exercabation   . Asthma   . Breast cancer 1991    s/p lumpectomy, chemotherapy and radiation therapy in 1991. Mammogram in 2007 was normal.  . Sigmoid diverticulitis 80/2008  . Anxiety   . Depression   . Obesity   . GERD (gastroesophageal reflux disease)   . Heart murmur 10/05/11    "first time I ever heard I had one was today"  . Pneumonia   . Shortness of breath 10/05/11    "at rest; lying down; w/exertion"  . Diabetes mellitus   . Bronchitis     h/o  . Diarrhea     h/o  . Constipated     h/o   Past Surgical History  Procedure Laterality Date  . Dobutamine stress echo  08/2004    Inferior ischemia, normal LV systolic function, no significant CAD  . Abdominal hysterectomy    . Breast surgery  1991    lumphectomy right breast  . Neck surgery  2012    "Dr. Lynann Bologna  put plate in; did  something to my vertebrae"  . Cardiac catheterization      left cardiac cath 93/2671 normal systolic function, no CAD   Social History:  reports that she has been smoking Cigarettes.  She has a 16 pack-year smoking history. She has never used smokeless tobacco. She reports that she drinks about 2.4 ounces of alcohol per week. She reports that she does not use illicit drugs.  No Known Allergies  Family History  Problem Relation Age of Onset  . Cancer Mother      Prior to Admission medications   Medication Sig Start Date End Date Taking? Authorizing Provider  albuterol (PROVENTIL) (2.5 MG/3ML) 0.083% nebulizer solution Take 3 mLs (2.5 mg total) by nebulization every 4 (four) hours as needed for wheezing or shortness of  breath. 02/19/14  Yes Jones Bales, MD  albuterol (VENTOLIN HFA) 108 (90 BASE) MCG/ACT inhaler Inhale 2 puffs into the lungs every 6 (six) hours as needed for wheezing or shortness of breath. 02/19/14  Yes Jones Bales, MD  budesonide-formoterol (SYMBICORT) 80-4.5 MCG/ACT inhaler Inhale 2 puffs into the lungs 2 (two) times daily as needed. For asthma   Yes Historical Provider, MD  carvedilol (COREG) 6.25 MG tablet Take 6.25 mg by mouth 2 (two) times daily with a meal. 02/19/14  Yes Jones Bales, MD  citalopram (CELEXA) 40 MG tablet Take 1 tablet (40 mg total) by mouth daily. 02/19/14  Yes Jones Bales, MD  lisinopril-hydrochlorothiazide (PRINZIDE,ZESTORETIC) 20-25 MG per tablet Take 1 tablet by mouth every morning. 02/19/14  Yes Jones Bales, MD  loratadine (CLARITIN) 10 MG tablet Take 1 tablet (10 mg total) by mouth daily. 02/16/13  Yes Madaline Brilliant, MD  metFORMIN (GLUCOPHAGE) 500 MG tablet Take 1 tablet (500 mg total) by mouth 2 (two) times daily with a meal. 02/19/14  Yes Jones Bales, MD  pravastatin (PRAVACHOL) 40 MG tablet Take 40 mg by mouth at bedtime.   Yes Historical Provider, MD  albuterol (PROVENTIL HFA;VENTOLIN HFA) 108 (90 BASE) MCG/ACT inhaler Inhale 2 puffs into the lungs every 4 (four) hours as needed for wheezing. 02/23/12 02/22/13  Delsa Sale, MD   Physical Exam: Filed Vitals:   03/08/14 0800  BP: 137/64  Pulse: 115  Resp: 16    BP 137/64  Pulse 115  Resp 16  SpO2 100%  General:  Patient appears to be in mild distress, currently receiving nebulizer treatment. She appears to have dyspnea with speaking  Eyes: PERRL, normal lids, irises & conjunctiva ENT: grossly normal hearing, lips & tongue, dry mucosa Neck: no LAD, masses or thyromegaly Cardiovascular: Tachacardia, RRR, no m/r/g. No LE edema. Telemetry: SR, no arrhythmias  Respiratory: Patient becoming dyspneic with speaking to me, diminished breath sounds bilaterally, scattered expiratory  wheezing, positive bilateral rhonchi Abdomen: soft, ntnd Skin: no rash or induration seen on limited exam Musculoskeletal: grossly normal tone BUE/BLE Psychiatric: grossly normal mood and affect, speech fluent and appropriate Neurologic: grossly non-focal.          Labs on Admission:  Basic Metabolic Panel:  Recent Labs Lab 03/08/14 0545  NA 140  K 3.6*  CL 98  CO2 26  GLUCOSE 102*  BUN 8  CREATININE 0.71  CALCIUM 9.4   Liver Function Tests:  Recent Labs Lab 03/08/14 0545  AST 18  ALT 8  ALKPHOS 106  BILITOT 0.3  PROT 7.9  ALBUMIN 3.8   No results found for this basename: LIPASE, AMYLASE,  in the last 168 hours No  results found for this basename: AMMONIA,  in the last 168 hours CBC:  Recent Labs Lab 03/08/14 0545  WBC 5.6  HGB 12.3  HCT 36.0  MCV 85.3  PLT 341   Cardiac Enzymes: No results found for this basename: CKTOTAL, CKMB, CKMBINDEX, TROPONINI,  in the last 168 hours  BNP (last 3 results)  Recent Labs  03/08/14 0545  PROBNP 51.2   CBG: No results found for this basename: GLUCAP,  in the last 168 hours  Radiological Exams on Admission: Dg Chest Port 1 View  03/08/2014   CLINICAL DATA:  Shortness of breath, cough and wheezing.  EXAM: PORTABLE CHEST - 1 VIEW  COMPARISON:  Chest radiograph from 11/30/2013  FINDINGS: The lungs are mildly hyperexpanded. Mild vascular congestion is noted. No definite focal consolidation, pleural effusion or pneumothorax is seen, though the left lung base is incompletely imaged on this study.  The cardiomediastinal silhouette is within normal limits. No acute osseous abnormalities are seen. Cervical spinal fusion hardware is noted. Clips are seen overlying the right axilla.  IMPRESSION: Mild vascular congestion noted; lungs remain grossly clear.   Electronically Signed   By: Garald Balding M.D.   On: 03/08/2014 06:30    EKG: Independently reviewed.   Assessment/Plan Principal Problem:   COPD (chronic obstructive  pulmonary disease) Active Problems:   Respiratory failure   DIABETES MELLITUS, TYPE II   TOBACCO ABUSE   Essential hypertension, benign   Bronchitis, chronic obstructive w acute bronchitis   1. Acute respiratory failure, evidence by patient's presentation of respiratory distress, respiratory rate of 27, likely secondary to chronic obstructive palmar disease exacerbation, improved with the administration of meds and steroids in the emergency room. Will admit patient to telemetry, provide IV Solu-Medrol, empiric antibiotics, nebs. 2. Chronic obstructive pulmonary disease. Patient having a 30 year history of tobacco abuse, will start Solu-Medrol 60 mg IV every 6 hours, scheduled duo nebs every 4 hours, provide empiric IV antibiotic therapy. I suspect underlying infectious process precipitating COPD exacerbation.  3. Possible community acquire pneumonia. Patient reporting sick contacts from her grandson. Initial chest x-ray was negative, will treat empirically with Levaquin 750 mg IV every 24 hours. It is also conceivable COPD exacerbation was precipitated by a viral infection. Will check a flu swab. Repeat chest x-ray in a.m. 4. Type 2 diabetes mellitus. Will hold metformin, provide some scale coverage with Accu-Cheks Q. [place] each bedtime.  5. Hypertension. Will continue Coreg and lisinopril therapy. 6. DVT prophylaxis.    Code Status: Full Code Family Communication:  Disposition Plan: Will admit patient to the inpatient service, and his facial require greater than 2 nights hospitalization  Time spent: 70 min  [facility] Pager [phone]

## 2014-03-08 NOTE — ED Notes (Signed)
Pt sats 100% on room air. Pt stable for transport to [place]. Notified admitting MD that pt may benefit from PRN pain medication for rib pain.

## 2014-03-08 NOTE — ED Provider Notes (Signed)
CSN: [id number]     Arrival date & time 03/08/14  7628 History   First MD Initiated Contact with Patient 03/08/14 0550     Chief Complaint  Patient presents with  . Shortness of Breath     (Consider location/radiation/quality/duration/timing/severity/associated sxs/prior Treatment) HPI Comments: 63 year old female presents with severe respiratory distress. She does have a history of asthma, she is on multiple medications at home for this. She reports feeling under the weather several days ago when she became sick with a cough after being exposed to her 34 year old grandson who had a cold. Her symptoms have gradually worsened and are now severe. She has associated diaphoresis but no fever. She has a cough that is productive but no swelling of the lower extremities. She has been using albuterol treatments at home with minimal relief.  Patient is a 63 y.o. female presenting with shortness of breath. The history is provided by the patient.  Shortness of Breath   Past Medical History  Diagnosis Date  . Hyperlipidemia   . Hypertension   . Tobacco abuse   . COPD (chronic obstructive pulmonary disease)     History of multiple hospital admissions for exercabation   . Asthma   . Breast cancer 1991    s/p lumpectomy, chemotherapy and radiation therapy in 1991. Mammogram in 2007 was normal.  . Sigmoid diverticulitis 80/2008  . Anxiety   . Depression   . Obesity   . GERD (gastroesophageal reflux disease)   . Heart murmur 10/05/11    "first time I ever heard I had one was today"  . Pneumonia   . Shortness of breath 10/05/11    "at rest; lying down; w/exertion"  . Diabetes mellitus   . Bronchitis     h/o  . Diarrhea     h/o  . Constipated     h/o   Past Surgical History  Procedure Laterality Date  . Dobutamine stress echo  08/2004    Inferior ischemia, normal LV systolic function, no significant CAD  . Abdominal hysterectomy    . Breast surgery  1991    lumphectomy right breast  .  Neck surgery  2012    "Dr. Lynann Bologna  put plate in; did something to my vertebrae"  . Cardiac catheterization      left cardiac cath 31/5176 normal systolic function, no CAD   Family History  Problem Relation Age of Onset  . Cancer Mother    History  Substance Use Topics  . Smoking status: Current Some Day Smoker -- 0.40 packs/day for 40 years    Types: Cigarettes  . Smokeless tobacco: Never Used     Comment: given QUIT line info - husband states he will quit when she does  . Alcohol Use: 2.4 oz/week    4 Cans of beer per week     Comment: "only on the weekends"   OB History   Grav Para Term Preterm Abortions TAB SAB Ect Mult Living                 Review of Systems  Respiratory: Positive for shortness of breath.   All other systems reviewed and are negative.     Allergies  Review of patient's allergies indicates no known allergies.  Home Medications   Prior to Admission medications   Medication Sig Start Date End Date Taking? Authorizing Provider  albuterol (PROVENTIL HFA;VENTOLIN HFA) 108 (90 BASE) MCG/ACT inhaler Inhale 2 puffs into the lungs every 4 (four) hours as needed for wheezing.  02/23/12 02/22/13  Delsa Sale, MD  albuterol (PROVENTIL) (2.5 MG/3ML) 0.083% nebulizer solution Take 3 mLs (2.5 mg total) by nebulization every 4 (four) hours as needed for wheezing or shortness of breath. 02/19/14   Jones Bales, MD  albuterol (VENTOLIN HFA) 108 (90 BASE) MCG/ACT inhaler Inhale 2 puffs into the lungs every 6 (six) hours as needed for wheezing or shortness of breath. 02/19/14   Jones Bales, MD  budesonide-formoterol (SYMBICORT) 80-4.5 MCG/ACT inhaler Inhale 2 puffs into the lungs 2 (two) times daily as needed. For asthma    Historical Provider, MD  carvedilol (COREG) 6.25 MG tablet TAKE 1 TABLET BY MOUTH TWICE DAILY 03/13/13   Pedro Earls, MD  carvedilol (COREG) 6.25 MG tablet Take 1 tablet (6.25 mg total) by mouth 2 (two) times daily with a meal. 02/19/14    Jones Bales, MD  citalopram (CELEXA) 40 MG tablet Take 1 tablet (40 mg total) by mouth daily. 02/19/14   Jones Bales, MD  fluticasone (FLONASE) 50 MCG/ACT nasal spray Place 2 sprays into the nose daily as needed for allergies.    Historical Provider, MD  fluticasone (FLONASE) 50 MCG/ACT nasal spray USE 2 SPRAYS IN EACH NOSTRIL EVERY DAY 03/13/13   Pedro Earls, MD  HYDROcodone-acetaminophen (NORCO/VICODIN) 5-325 MG per tablet Take 1 tablet by mouth every 4 (four) hours as needed for pain. 02/16/13   Madaline Brilliant, MD  HYDROcodone-homatropine [facility]) 5-1.5 MG/5ML syrup Take 5 mLs by mouth every 6 (six) hours as needed for cough. 11/30/13   Freeman Caldron Baker, PA-C  levofloxacin (LEVAQUIN) 500 MG tablet Take 1 tablet (500 mg total) by mouth daily. 11/30/13   Freeman Caldron Baker, PA-C  lisinopril-hydrochlorothiazide (PRINZIDE,ZESTORETIC) 20-25 MG per tablet TAKE 1 TABLET BY MOUTH DAILY 03/13/13   Pedro Earls, MD  lisinopril-hydrochlorothiazide (PRINZIDE,ZESTORETIC) 20-25 MG per tablet Take 1 tablet by mouth every morning. 02/19/14   Jones Bales, MD  loratadine (CLARITIN) 10 MG tablet Take 1 tablet (10 mg total) by mouth daily. 02/16/13   Madaline Brilliant, MD  metFORMIN (GLUCOPHAGE) 500 MG tablet TAKE 1 TABLET BY MOUTH TWICE DAILY WITH MEAL 03/13/13   Pedro Earls, MD  metFORMIN (GLUCOPHAGE) 500 MG tablet Take 1 tablet (500 mg total) by mouth 2 (two) times daily with a meal. 02/19/14   Jones Bales, MD  pravastatin (PRAVACHOL) 40 MG tablet Take 40 mg by mouth at bedtime.    Historical Provider, MD  pravastatin (PRAVACHOL) 40 MG tablet Take 1 tablet (40 mg total) by mouth daily. 08/03/13   Jones Bales, MD  predniSONE (DELTASONE) 50 MG tablet Take 1 tablet (50 mg total) by mouth daily. 11/30/13   Freeman Caldron Baker, PA-C   BP 144/61  Pulse 102  Resp 23  SpO2 99% Physical Exam  Nursing note and vitals reviewed. Constitutional: She appears well-developed and well-nourished. She appears distressed.   HENT:  Head: Normocephalic and atraumatic.  Mouth/Throat: Oropharynx is clear and moist. No oropharyngeal exudate.  Eyes: Conjunctivae and EOM are normal. Pupils are equal, round, and reactive to light. Right eye exhibits no discharge. Left eye exhibits no discharge. No scleral icterus.  Neck: Normal range of motion. Neck supple. No JVD present. No thyromegaly present.  Cardiovascular: Normal rate, regular rhythm, normal heart sounds and intact distal pulses.  Exam reveals no gallop and no friction rub.   No murmur heard. Pulmonary/Chest: She is in respiratory distress. She has wheezes. She has no rales.  Speaks in one to 2  word sentences, diffuse expiratory wheezing and tight lungs, accessory muscle use present  Abdominal: Soft. Bowel sounds are normal. She exhibits no distension and no mass. There is no tenderness.  Musculoskeletal: Normal range of motion. She exhibits no edema and no tenderness.  Lymphadenopathy:    She has no cervical adenopathy.  Neurological: She is alert. Coordination normal.  Skin: Skin is warm and dry. No rash noted. No erythema.  Psychiatric: She has a normal mood and affect. Her behavior is normal.    ED Course  Procedures (including critical care time) Labs Review Labs Reviewed  COMPREHENSIVE METABOLIC PANEL - Abnormal; Notable for the following:    Potassium 3.6 (*)    Glucose, Bld 102 (*)    GFR calc non Af Amer 90 (*)    All other components within normal limits  CBC  PRO B NATRIURETIC PEPTIDE  I-STAT TROPOININ, ED    Imaging Review Dg Chest Port 1 View  03/08/2014   CLINICAL DATA:  Shortness of breath, cough and wheezing.  EXAM: PORTABLE CHEST - 1 VIEW  COMPARISON:  Chest radiograph from 11/30/2013  FINDINGS: The lungs are mildly hyperexpanded. Mild vascular congestion is noted. No definite focal consolidation, pleural effusion or pneumothorax is seen, though the left lung base is incompletely imaged on this study.  The cardiomediastinal silhouette  is within normal limits. No acute osseous abnormalities are seen. Cervical spinal fusion hardware is noted. Clips are seen overlying the right axilla.  IMPRESSION: Mild vascular congestion noted; lungs remain grossly clear.   Electronically Signed   By: Garald Balding M.D.   On: 03/08/2014 06:30     EKG Interpretation   Date/Time:  Friday Mar 08 2014 06:42:27 EDT Ventricular Rate:  97 PR Interval:  155 QRS Duration: 88 QT Interval:  364 QTC Calculation: 462 R Axis:   78 Text Interpretation:  Sinus rhythm Biatrial enlargement ECG OTHERWISE  WITHIN NORMAL LIMITS since last tracing no significant change Confirmed by  [place] ([id number]) on 03/08/2014 6:53:56 AM      MDM   Final diagnoses:  Respiratory distress  Acute asthma exacerbation    The patient appears acutely ill from and respiratory illness. Will treat with continuous nebulizer therapy, Solu-Medrol, chest x-ray to rule out other source such as pneumonia, oxygen supplementation.   The patient has continued respiratory distress and accessory muscle use after first continuous nebulizer treatment, this will be repeated, 10 mg of albuterol, Solu-Medrol as needed, chest x-ray without acute findings, blood counts normal. The patient states that she feels that this is a severe asthma attack that she will likely need to be admitted to the hospital, she states that she has had to be admitted to the hospital before and this feels similar. At this time her oxygen level is 98%, she is not tachycardic but still visibly and audibly in respiratory distress  Johnna Acosta, MD 03/08/14 [phone]

## 2014-03-08 NOTE — ED Notes (Signed)
Admitting MD at bedside.

## 2014-03-09 ENCOUNTER — Observation Stay (HOSPITAL_COMMUNITY): Payer: Medicare Other

## 2014-03-09 DIAGNOSIS — J9600 Acute respiratory failure, unspecified whether with hypoxia or hypercapnia: Principal | ICD-10-CM

## 2014-03-09 DIAGNOSIS — R0602 Shortness of breath: Secondary | ICD-10-CM | POA: Diagnosis not present

## 2014-03-09 DIAGNOSIS — R0989 Other specified symptoms and signs involving the circulatory and respiratory systems: Secondary | ICD-10-CM

## 2014-03-09 DIAGNOSIS — R05 Cough: Secondary | ICD-10-CM | POA: Diagnosis not present

## 2014-03-09 DIAGNOSIS — F17200 Nicotine dependence, unspecified, uncomplicated: Secondary | ICD-10-CM

## 2014-03-09 DIAGNOSIS — J984 Other disorders of lung: Secondary | ICD-10-CM | POA: Diagnosis not present

## 2014-03-09 DIAGNOSIS — R059 Cough, unspecified: Secondary | ICD-10-CM | POA: Diagnosis not present

## 2014-03-09 DIAGNOSIS — J449 Chronic obstructive pulmonary disease, unspecified: Secondary | ICD-10-CM | POA: Diagnosis not present

## 2014-03-09 DIAGNOSIS — R0609 Other forms of dyspnea: Secondary | ICD-10-CM

## 2014-03-09 DIAGNOSIS — I10 Essential (primary) hypertension: Secondary | ICD-10-CM | POA: Diagnosis not present

## 2014-03-09 LAB — BASIC METABOLIC PANEL
BUN: 10 mg/dL (ref 6–23)
CO2: 23 mEq/L (ref 19–32)
Calcium: 8.9 mg/dL (ref 8.4–10.5)
Chloride: 100 mEq/L (ref 96–112)
Creatinine, Ser: 0.62 mg/dL (ref 0.50–1.10)
GFR calc non Af Amer: >90 mL/min (ref >90)
Glucose, Bld: 350 mg/dL — ABNORMAL HIGH (ref 70–99)
POTASSIUM: 4.6 meq/L (ref 3.7–5.3)
SODIUM: 136 meq/L — AB (ref 137–147)

## 2014-03-09 LAB — GLUCOSE, CAPILLARY
GLUCOSE-CAPILLARY: 379 mg/dL — AB (ref 70–99)
GLUCOSE-CAPILLARY: 348 mg/dL — AB (ref 70–99)
Glucose-Capillary: 353 mg/dL — ABNORMAL HIGH (ref 70–99)
Glucose-Capillary: 192 mg/dL — ABNORMAL HIGH (ref 70–99)

## 2014-03-09 LAB — CBC
HCT: 34.4 % — ABNORMAL LOW (ref 36.0–46.0)
HEMOGLOBIN: 11.4 g/dL — AB (ref 12.0–15.0)
MCH: 28.8 pg (ref 26.0–34.0)
MCHC: 33.1 g/dL (ref 30.0–36.0)
MCV: 86.9 fL (ref 78.0–100.0)
Platelets: 297 10*3/uL (ref 150–400)
RBC: 3.96 MIL/uL (ref 3.87–5.11)
RDW: 12.9 % (ref 11.5–15.5)
WBC: 9.3 10*3/uL (ref 4.0–10.5)

## 2014-03-09 LAB — INFLUENZA PANEL BY PCR (TYPE A & B)
H1N1FLUPCR: NOT DETECTED
INFLBPCR: NEGATIVE
Influenza A By PCR: NEGATIVE

## 2014-03-09 MED ORDER — INSULIN GLARGINE 100 UNIT/ML ~~LOC~~ SOLN
10.0000 [IU] | Freq: Every day | SUBCUTANEOUS | Status: DC
  Administered 2014-03-09 – 2014-03-11 (×3): 10 [IU] via SUBCUTANEOUS
  Filled 2014-03-09 (×3): qty 0.1

## 2014-03-09 MED ORDER — LEVOFLOXACIN 750 MG PO TABS
750.0000 mg | ORAL_TABLET | Freq: Every day | ORAL | Status: DC
  Administered 2014-03-09 – 2014-03-12 (×4): 750 mg via ORAL
  Filled 2014-03-09 (×4): qty 1

## 2014-03-09 MED ORDER — IPRATROPIUM-ALBUTEROL 0.5-2.5 (3) MG/3ML IN SOLN
3.0000 mL | Freq: Four times a day (QID) | RESPIRATORY_TRACT | Status: DC
  Administered 2014-03-09 – 2014-03-11 (×8): 3 mL via RESPIRATORY_TRACT
  Filled 2014-03-09 (×9): qty 3

## 2014-03-09 MED ORDER — PNEUMOCOCCAL VAC POLYVALENT 25 MCG/0.5ML IJ INJ
0.5000 mL | INJECTION | INTRAMUSCULAR | Status: AC
  Administered 2014-03-10: 0.5 mL via INTRAMUSCULAR
  Filled 2014-03-09: qty 0.5

## 2014-03-09 NOTE — Progress Notes (Signed)
[facility] PROGRESS NOTE  [name]:[id number] DOB: 07-26-1951 DOA: 03/08/2014 PCP: [name], MD  Assessment/Plan: 63 y.o. female patient is a pleasant 63 year old female with a past medical history of tobacco abuse, chronic obstructive pulmonary disease/asthma, presented to the emergent apartment with complaints of shortness of breath, cough, and wheezing that has progressively worsened over the past several days  Principal Problem:   COPD (chronic obstructive pulmonary disease)  Active Problems:  Respiratory failure  DIABETES MELLITUS, TYPE II  TOBACCO ABUSE  Essential hypertension, benign  Bronchitis, chronic obstructive w acute bronchitis   1. COPD exacerbation; CXR no clear infiltrates; active tobacco use;  30 year history of tobacco abuse -cont IV steroids, atx, bronchodilators, oxygne; stop smoking   2. Acute respiratory failure likely due to COPD; improved; cont as above . 3. Type 2 diabetes mellitus. Uncontrolled; Will hold metformin, lantus 10+ISS:  Recheck HA1C 4. Hypertension. Will continue Coreg and lisinopril therapy.  Code Status: full Family Communication:  D/w patient (indicate person spoken with, relationship, and if by phone, the number) Disposition Plan: home 2-3 days    Consultants:  none  Procedures:  none  Antibiotics:  levofloxacin (indicate start date, and stop date if known)  HPI/Subjective: alert  Objective: Filed Vitals:   03/09/14 0835  BP: 152/77  Pulse: 92  Temp: 98.9 F (37.2 C)  Resp: 18    Intake/Output Summary (Last 24 hours) at 03/09/14 1044 Last data filed at 03/09/14 0900  Gross per 24 hour  Intake    840 ml  Output      0 ml  Net    840 ml   Filed Weights   03/08/14 0902 03/08/14 2118  Weight: 95.2 kg (209 lb 14.1 oz) 97.1 kg (214 lb 1.1 oz)    Exam:   General:  alert  Cardiovascular: s1,s2 rrr  Respiratory: BL few wheezing   Abdomen: soft,nt,nd   Musculoskeletal: no LE edema    Data Reviewed: Basic Metabolic Panel:  Recent Labs Lab 03/08/14 0545 03/09/14 0555  NA 140 136*  K 3.6* 4.6  CL 98 100  CO2 26 23  GLUCOSE 102* 350*  BUN 8 10  CREATININE 0.71 0.62  CALCIUM 9.4 8.9   Liver Function Tests:  Recent Labs Lab 03/08/14 0545  AST 18  ALT 8  ALKPHOS 106  BILITOT 0.3  PROT 7.9  ALBUMIN 3.8   No results found for this basename: LIPASE, AMYLASE,  in the last 168 hours No results found for this basename: AMMONIA,  in the last 168 hours CBC:  Recent Labs Lab 03/08/14 0545 03/09/14 0555  WBC 5.6 9.3  HGB 12.3 11.4*  HCT 36.0 34.4*  MCV 85.3 86.9  PLT 341 297   Cardiac Enzymes: No results found for this basename: CKTOTAL, CKMB, CKMBINDEX, TROPONINI,  in the last 168 hours BNP (last 3 results)  Recent Labs  03/08/14 0545  PROBNP 51.2   CBG:  Recent Labs Lab 03/08/14 0828 03/08/14 1058 03/08/14 1504 03/08/14 2116 03/09/14 0833  GLUCAP 169* 236* 349* 362* 379*    No results found for this or any previous visit (from the past 240 hour(s)).   Studies: X-ray Chest Pa And Lateral   03/09/2014   CLINICAL DATA:  Cough and shortness of breath. Right lower rib pain.  EXAM: CHEST  2 VIEW  COMPARISON:  03/08/2014  FINDINGS: Surgical clips noted in the right axilla. The heart size and mediastinal contours are within normal limits. Both lungs are clear. The  visualized skeletal structures are unremarkable.  IMPRESSION: 1. No acute cardiopulmonary abnormalities.   Electronically Signed   By: Kerby Moors M.D.   On: 03/09/2014 09:17   Dg Chest Port 1 View  03/08/2014   CLINICAL DATA:  Shortness of breath, cough and wheezing.  EXAM: PORTABLE CHEST - 1 VIEW  COMPARISON:  Chest radiograph from 11/30/2013  FINDINGS: The lungs are mildly hyperexpanded. Mild vascular congestion is noted. No definite focal consolidation, pleural effusion or pneumothorax is seen, though the left lung base is incompletely imaged on this study.  The cardiomediastinal  silhouette is within normal limits. No acute osseous abnormalities are seen. Cervical spinal fusion hardware is noted. Clips are seen overlying the right axilla.  IMPRESSION: Mild vascular congestion noted; lungs remain grossly clear.   Electronically Signed   By: Garald Balding M.D.   On: 03/08/2014 06:30    Scheduled Meds: . carvedilol  6.25 mg Oral BID WC  . citalopram  40 mg Oral Daily  . enoxaparin (LOVENOX) injection  40 mg Subcutaneous Q24H  . insulin aspart  0-20 Units Subcutaneous TID WC  . insulin aspart  0-5 Units Subcutaneous QHS  . ipratropium-albuterol  3 mL Nebulization TID  . levofloxacin (LEVAQUIN) IV  750 mg Intravenous Q24H  . lisinopril  10 mg Oral Daily  . methylPREDNISolone (SOLU-MEDROL) injection  60 mg Intravenous Q6H  . [START ON 03/10/2014] pneumococcal 23 valent vaccine  0.5 mL Intramuscular Tomorrow-1000  . simvastatin  10 mg Oral q1800  . sodium chloride  3 mL Intravenous Q12H   Continuous Infusions: . sodium chloride 75 mL/hr at 03/09/14 0227    Principal Problem:   COPD (chronic obstructive pulmonary disease) Active Problems:   DIABETES MELLITUS, TYPE II   TOBACCO ABUSE   Essential hypertension, benign   Bronchitis, chronic obstructive w acute bronchitis   Respiratory failure    Time spent: >35 minutes     Kinnie Feil  [facility] Pager [phone]. If 7PM-7AM, please contact night-coverage at [email or web address], password [email or web address] 03/09/2014, 10:44 AM  LOS: 1 day

## 2014-03-09 NOTE — Progress Notes (Signed)
PHARMACIST - PHYSICIAN COMMUNICATION DR:   [facility] CONCERNING: Antibiotic IV to Oral Route Change Policy  RECOMMENDATION: This patient is receiving levaquin by the intravenous route.  Based on criteria approved by the Pharmacy and Therapeutics Committee, the antibiotic(s) is/are being converted to the equivalent oral dose form(s).   DESCRIPTION: These criteria include:  Patient being treated for a respiratory tract infection, urinary tract infection, cellulitis or clostridium difficile associated diarrhea if on metronidazole  The patient is not neutropenic and does not exhibit a GI malabsorption state  The patient is eating (either orally or via tube) and/or has been taking other orally administered medications for a least 24 hours  The patient is improving clinically and has a Tmax < 100.5  If you have questions about this conversion, please contact the Pharmacy Department  []   [phone] )  [name] [x]   [phone] )  [name]  []   [phone] )  [name] []   [phone] )  [name]

## 2014-03-10 DIAGNOSIS — J449 Chronic obstructive pulmonary disease, unspecified: Secondary | ICD-10-CM | POA: Diagnosis not present

## 2014-03-10 DIAGNOSIS — R0989 Other specified symptoms and signs involving the circulatory and respiratory systems: Secondary | ICD-10-CM

## 2014-03-10 DIAGNOSIS — I10 Essential (primary) hypertension: Secondary | ICD-10-CM | POA: Diagnosis not present

## 2014-03-10 DIAGNOSIS — R0609 Other forms of dyspnea: Secondary | ICD-10-CM

## 2014-03-10 DIAGNOSIS — F17200 Nicotine dependence, unspecified, uncomplicated: Secondary | ICD-10-CM

## 2014-03-10 DIAGNOSIS — J984 Other disorders of lung: Secondary | ICD-10-CM

## 2014-03-10 DIAGNOSIS — J9600 Acute respiratory failure, unspecified whether with hypoxia or hypercapnia: Secondary | ICD-10-CM

## 2014-03-10 LAB — GLUCOSE, CAPILLARY
GLUCOSE-CAPILLARY: 318 mg/dL — AB (ref 70–99)
Glucose-Capillary: 290 mg/dL — ABNORMAL HIGH (ref 70–99)
Glucose-Capillary: 222 mg/dL — ABNORMAL HIGH (ref 70–99)

## 2014-03-10 MED ORDER — METHYLPREDNISOLONE SODIUM SUCC 125 MG IJ SOLR
60.0000 mg | Freq: Two times a day (BID) | INTRAMUSCULAR | Status: DC
  Administered 2014-03-10 – 2014-03-11 (×2): 60 mg via INTRAVENOUS
  Filled 2014-03-10 (×3): qty 0.96

## 2014-03-10 NOTE — Progress Notes (Signed)
[facility] PROGRESS NOTE  [name] [id number] DOB: 1951-03-24 DOA: 03/08/2014 PCP: [name], MD  Assessment/Plan: 63 y.o. female patient is a pleasant 63 year old female with a past medical history of tobacco abuse, chronic obstructive pulmonary disease/asthma, presented to the emergent apartment with complaints of shortness of breath, cough, and wheezing that has progressively worsened over the past several days  Principal Problem:   COPD (chronic obstructive pulmonary disease)  Active Problems:  Respiratory failure  DIABETES MELLITUS, TYPE II  TOBACCO ABUSE  Essential hypertension, benign  Bronchitis, chronic obstructive w acute bronchitis   1. COPD exacerbation; CXR no clear infiltrates; active tobacco use;  30 year history of tobacco abuse -cont IV steroids, atx, bronchodilators, oxygne; stop smoking   2. Acute respiratory failure likely due to COPD; improved; cont as above . 3. Type 2 diabetes mellitus. Uncontrolled; Will hold metformin, lantus 10+ISS:  Recheck HA1C 4. Hypertension. Will continue Coreg and lisinopril therapy.  Code Status: full Family Communication:  D/w patient (indicate person spoken with, relationship, and if by phone, the number) Disposition Plan: home 2-3 days    Consultants:  none  Procedures:  none  Antibiotics:  levofloxacin (indicate start date, and stop date if known)  HPI/Subjective: alert  Objective: Filed Vitals:   03/10/14 0800  BP: 162/73  Pulse: 93  Temp: 98.1 F (36.7 C)  Resp: 20    Intake/Output Summary (Last 24 hours) at 03/10/14 1032 Last data filed at 03/10/14 0938  Gross per 24 hour  Intake   1363 ml  Output      0 ml  Net   1363 ml   Filed Weights   03/08/14 0902 03/08/14 2118 03/09/14 2129  Weight: 95.2 kg (209 lb 14.1 oz) 97.1 kg (214 lb 1.1 oz) 99.7 kg (219 lb 12.8 oz)    Exam:   General:  alert  Cardiovascular: s1,s2 rrr  Respiratory: BL few wheezing   Abdomen:  soft,nt,nd   Musculoskeletal: no LE edema   Data Reviewed: Basic Metabolic Panel:  Recent Labs Lab 03/08/14 0545 03/09/14 0555  NA 140 136*  K 3.6* 4.6  CL 98 100  CO2 26 23  GLUCOSE 102* 350*  BUN 8 10  CREATININE 0.71 0.62  CALCIUM 9.4 8.9   Liver Function Tests:  Recent Labs Lab 03/08/14 0545  AST 18  ALT 8  ALKPHOS 106  BILITOT 0.3  PROT 7.9  ALBUMIN 3.8   No results found for this basename: LIPASE, AMYLASE,  in the last 168 hours No results found for this basename: AMMONIA,  in the last 168 hours CBC:  Recent Labs Lab 03/08/14 0545 03/09/14 0555  WBC 5.6 9.3  HGB 12.3 11.4*  HCT 36.0 34.4*  MCV 85.3 86.9  PLT 341 297   Cardiac Enzymes: No results found for this basename: CKTOTAL, CKMB, CKMBINDEX, TROPONINI,  in the last 168 hours BNP (last 3 results)  Recent Labs  03/08/14 0545  PROBNP 51.2   CBG:  Recent Labs Lab 03/09/14 0833 03/09/14 1213 03/09/14 1721 03/09/14 2131 03/10/14 0759  GLUCAP 379* 348* 353* 192* 318*    No results found for this or any previous visit (from the past 240 hour(s)).   Studies: X-ray Chest Pa And Lateral   03/09/2014   CLINICAL DATA:  Cough and shortness of breath. Right lower rib pain.  EXAM: CHEST  2 VIEW  COMPARISON:  03/08/2014  FINDINGS: Surgical clips noted in the right axilla. The heart size and mediastinal contours are within normal  limits. Both lungs are clear. The visualized skeletal structures are unremarkable.  IMPRESSION: 1. No acute cardiopulmonary abnormalities.   Electronically Signed   By: Kerby Moors M.D.   On: 03/09/2014 09:17    Scheduled Meds: . carvedilol  6.25 mg Oral BID WC  . citalopram  40 mg Oral Daily  . enoxaparin (LOVENOX) injection  40 mg Subcutaneous Q24H  . insulin aspart  0-20 Units Subcutaneous TID WC  . insulin aspart  0-5 Units Subcutaneous QHS  . insulin glargine  10 Units Subcutaneous Daily  . ipratropium-albuterol  3 mL Nebulization Q6H  . levofloxacin  750 mg  Oral Daily  . lisinopril  10 mg Oral Daily  . methylPREDNISolone (SOLU-MEDROL) injection  60 mg Intravenous Q6H  . simvastatin  10 mg Oral q1800  . sodium chloride  3 mL Intravenous Q12H   Continuous Infusions:    Principal Problem:   COPD (chronic obstructive pulmonary disease) Active Problems:   DIABETES MELLITUS, TYPE II   TOBACCO ABUSE   Essential hypertension, benign   Bronchitis, chronic obstructive w acute bronchitis   Respiratory failure    Time spent: >35 minutes     Kinnie Feil  [facility] Pager [phone]. If 7PM-7AM, please contact night-coverage at [email or web address], password [facility] 03/10/2014, 10:32 AM  LOS: 2 days

## 2014-03-11 DIAGNOSIS — F17200 Nicotine dependence, unspecified, uncomplicated: Secondary | ICD-10-CM | POA: Diagnosis not present

## 2014-03-11 DIAGNOSIS — R0609 Other forms of dyspnea: Secondary | ICD-10-CM | POA: Diagnosis not present

## 2014-03-11 DIAGNOSIS — J45901 Unspecified asthma with (acute) exacerbation: Secondary | ICD-10-CM | POA: Diagnosis not present

## 2014-03-11 DIAGNOSIS — J449 Chronic obstructive pulmonary disease, unspecified: Secondary | ICD-10-CM | POA: Diagnosis not present

## 2014-03-11 LAB — HEMOGLOBIN A1C
Hgb A1c MFr Bld: 7.6 % — ABNORMAL HIGH (ref <5.7)
MEAN PLASMA GLUCOSE: 171 mg/dL — AB (ref <117)

## 2014-03-11 LAB — GLUCOSE, CAPILLARY: GLUCOSE-CAPILLARY: 299 mg/dL — AB (ref 70–99)

## 2014-03-11 MED ORDER — CITALOPRAM HYDROBROMIDE 20 MG PO TABS
20.0000 mg | ORAL_TABLET | Freq: Every day | ORAL | Status: DC
  Administered 2014-03-11 – 2014-03-12 (×2): 20 mg via ORAL
  Filled 2014-03-11 (×2): qty 1

## 2014-03-11 MED ORDER — SENNOSIDES-DOCUSATE SODIUM 8.6-50 MG PO TABS
1.0000 | ORAL_TABLET | Freq: Two times a day (BID) | ORAL | Status: DC
  Administered 2014-03-11 – 2014-03-12 (×3): 1 via ORAL
  Filled 2014-03-11 (×3): qty 1

## 2014-03-11 MED ORDER — METHYLPREDNISOLONE SODIUM SUCC 40 MG IJ SOLR
40.0000 mg | Freq: Two times a day (BID) | INTRAMUSCULAR | Status: DC
  Administered 2014-03-11: 40 mg via INTRAVENOUS
  Filled 2014-03-11 (×3): qty 1

## 2014-03-11 MED ORDER — INSULIN ASPART 100 UNIT/ML ~~LOC~~ SOLN
3.0000 [IU] | Freq: Three times a day (TID) | SUBCUTANEOUS | Status: DC
Start: 2014-03-11 — End: 2014-03-12
  Administered 2014-03-11 – 2014-03-12 (×3): 3 [IU] via SUBCUTANEOUS

## 2014-03-11 MED ORDER — IPRATROPIUM-ALBUTEROL 0.5-2.5 (3) MG/3ML IN SOLN
3.0000 mL | Freq: Four times a day (QID) | RESPIRATORY_TRACT | Status: DC | PRN
  Administered 2014-03-11 – 2014-03-12 (×2): 3 mL via RESPIRATORY_TRACT
  Filled 2014-03-11 (×2): qty 3

## 2014-03-11 MED ORDER — INSULIN GLARGINE 100 UNIT/ML ~~LOC~~ SOLN
20.0000 [IU] | Freq: Every day | SUBCUTANEOUS | Status: DC
  Administered 2014-03-12: 20 [IU] via SUBCUTANEOUS
  Filled 2014-03-11: qty 0.2

## 2014-03-11 MED ORDER — POLYETHYLENE GLYCOL 3350 17 G PO PACK
17.0000 g | PACK | Freq: Every day | ORAL | Status: DC
  Administered 2014-03-11 – 2014-03-12 (×2): 17 g via ORAL
  Filled 2014-03-11 (×3): qty 1

## 2014-03-11 NOTE — Progress Notes (Signed)
Pt requests something for BM. Pt states has not had BM in 4-5 days.  Paged Dr. Daleen Bo, nothing ordered at this time for constipation.

## 2014-03-11 NOTE — Progress Notes (Signed)
Inpatient Diabetes Program Recommendations  AACE/ADA: New Consensus Statement on Inpatient Glycemic Control (2013)  Target Ranges:  Prepandial:   less than 140 mg/dL      Peak postprandial:   less than 180 mg/dL (1-2 hours)      Critically ill patients:  140 - 180 mg/dL     Results for [name], [name] (MRN [id number]) as of 03/11/2014 09:57  Ref. Range 03/10/2014 07:59 03/10/2014 12:11 03/10/2014 21:16  Glucose-Capillary Latest Range: 70-99 mg/dL 318 (H) 290 (H) 222 (H)    Results for [name], [name] (MRN [id number]) as of 03/11/2014 09:57  Ref. Range 03/11/2014 07:45  Glucose-Capillary Latest Range: 70-99 mg/dL 299 (H)     Admitted with SOB, COPD.  History of DM2, HTN.  Home DM Meds: Metformin 500 mg bid   Patient currently getting IV steroids (IV Solumedrol 60 mg Q12 hours).  Glucose levels elevated.  A1c 7.6% shows decent control at home.   MD- Please consider the following in-hospital insulin adjustments while patient on IV steroids:  1. Increase Lantus to 20 units daily (0.2 units/kg dosing) 2. Add Novolog Meal coverage- Novolog 4 units tid with meals (patient is eating 100% of meals)   Will follow Wyn Quaker RN, MSN, CDE Diabetes Coordinator Inpatient Diabetes Program Team Pager: [phone] (8a-10p)

## 2014-03-11 NOTE — Progress Notes (Signed)
[facility] PROGRESS NOTE  [name] [id number]:[id number] DOB: [birth year] DOA: 03/08/2014 PCP: [name], MD  Assessment/Plan: 63 y.o. female patient is a pleasant 63 year old female with a past medical history of tobacco abuse, chronic obstructive pulmonary disease/asthma, presented to the emergent apartment with complaints of shortness of breath, cough, and wheezing that has progressively worsened over the past several days  Principal Problem:   COPD (chronic obstructive pulmonary disease)  Active Problems:  Respiratory failure  DIABETES MELLITUS, TYPE II  TOBACCO ABUSE  Essential hypertension, benign  Bronchitis, chronic obstructive w acute bronchitis   1. COPD exacerbation; CXR no clear infiltrates; active tobacco use;  30 year history of tobacco abuse -taper IV steroids, cont atx, bronchodilators, oxygne; stop smoking   2. Acute respiratory failure likely due to COPD; improved; cont as above . 3. Type 2 diabetes mellitus. Uncontrolled; Will hold metformin, lantus incerased 20+ISS:   4. Hypertension. Will continue Coreg and lisinopril therapy.  Code Status: full Family Communication:  D/w patient (indicate person spoken with, relationship, and if by phone, the number) Disposition Plan: home 2-3 days    Consultants:  none  Procedures:  none  Antibiotics:  levofloxacin (indicate start date, and stop date if known)  HPI/Subjective: alert  Objective: Filed Vitals:   03/11/14 1022  BP: 179/82  Pulse: 89  Temp: 99 F (37.2 C)  Resp: 20    Intake/Output Summary (Last 24 hours) at 03/11/14 1122 Last data filed at 03/11/14 1027  Gross per 24 hour  Intake    960 ml  Output      0 ml  Net    960 ml   Filed Weights   03/08/14 2118 03/09/14 2129 03/10/14 2119  Weight: 97.1 kg (214 lb 1.1 oz) 99.7 kg (219 lb 12.8 oz) 101.5 kg (223 lb 12.3 oz)    Exam:   General:  alert  Cardiovascular: s1,s2 rrr  Respiratory: BL few wheezing   Abdomen:  soft,nt,nd   Musculoskeletal: no LE edema   Data Reviewed: Basic Metabolic Panel:  Recent Labs Lab 03/08/14 0545 03/09/14 0555  NA 140 136*  K 3.6* 4.6  CL 98 100  CO2 26 23  GLUCOSE 102* 350*  BUN 8 10  CREATININE 0.71 0.62  CALCIUM 9.4 8.9   Liver Function Tests:  Recent Labs Lab 03/08/14 0545  AST 18  ALT 8  ALKPHOS 106  BILITOT 0.3  PROT 7.9  ALBUMIN 3.8   No results found for this basename: LIPASE, AMYLASE,  in the last 168 hours No results found for this basename: AMMONIA,  in the last 168 hours CBC:  Recent Labs Lab 03/08/14 0545 03/09/14 0555  WBC 5.6 9.3  HGB 12.3 11.4*  HCT 36.0 34.4*  MCV 85.3 86.9  PLT 341 297   Cardiac Enzymes: No results found for this basename: CKTOTAL, CKMB, CKMBINDEX, TROPONINI,  in the last 168 hours BNP (last 3 results)  Recent Labs  03/08/14 0545  PROBNP 51.2   CBG:  Recent Labs Lab 03/09/14 2131 03/10/14 0759 03/10/14 1211 03/10/14 2116 03/11/14 0745  GLUCAP 192* 318* 290* 222* 299*    No results found for this or any previous visit (from the past 240 hour(s)).   Studies: No results found.  Scheduled Meds: . carvedilol  6.25 mg Oral BID WC  . citalopram  20 mg Oral Daily  . enoxaparin (LOVENOX) injection  40 mg Subcutaneous Q24H  . insulin aspart  0-20 Units Subcutaneous TID WC  . insulin aspart  0-5 Units Subcutaneous QHS  . insulin glargine  10 Units Subcutaneous Daily  . ipratropium-albuterol  3 mL Nebulization Q6H  . levofloxacin  750 mg Oral Daily  . lisinopril  10 mg Oral Daily  . methylPREDNISolone (SOLU-MEDROL) injection  60 mg Intravenous Q12H  . simvastatin  10 mg Oral q1800  . sodium chloride  3 mL Intravenous Q12H   Continuous Infusions:    Principal Problem:   COPD (chronic obstructive pulmonary disease) Active Problems:   DIABETES MELLITUS, TYPE II   TOBACCO ABUSE   Essential hypertension, benign   Bronchitis, chronic obstructive w acute bronchitis   Respiratory  failure    Time spent: >35 minutes     Kinnie Feil  [facility] Pager [phone]. If 7PM-7AM, please contact night-coverage at [email or web address], password [facility] 03/11/2014, 11:22 AM  LOS: 3 days

## 2014-03-12 DIAGNOSIS — J449 Chronic obstructive pulmonary disease, unspecified: Secondary | ICD-10-CM | POA: Diagnosis not present

## 2014-03-12 DIAGNOSIS — R0609 Other forms of dyspnea: Secondary | ICD-10-CM | POA: Diagnosis not present

## 2014-03-12 DIAGNOSIS — J440 Chronic obstructive pulmonary disease with acute lower respiratory infection: Secondary | ICD-10-CM | POA: Diagnosis not present

## 2014-03-12 DIAGNOSIS — J984 Other disorders of lung: Secondary | ICD-10-CM | POA: Diagnosis not present

## 2014-03-12 LAB — GLUCOSE, CAPILLARY
GLUCOSE-CAPILLARY: 156 mg/dL — AB (ref 70–99)
GLUCOSE-CAPILLARY: 288 mg/dL — AB (ref 70–99)
GLUCOSE-CAPILLARY: 178 mg/dL — AB (ref 70–99)
Glucose-Capillary: 253 mg/dL — ABNORMAL HIGH (ref 70–99)
Glucose-Capillary: 269 mg/dL — ABNORMAL HIGH (ref 70–99)
Glucose-Capillary: 322 mg/dL — ABNORMAL HIGH (ref 70–99)

## 2014-03-12 MED ORDER — PREDNISONE 10 MG PO TABS: 10.0000 mg | ORAL_TABLET | Freq: Every day | ORAL | Status: DC

## 2014-03-12 MED ORDER — LEVOFLOXACIN 750 MG PO TABS: 750.0000 mg | ORAL_TABLET | Freq: Every day | ORAL | Status: DC

## 2014-03-12 MED ORDER — GLIPIZIDE 5 MG PO TABS: 5.0000 mg | ORAL_TABLET | Freq: Every day | ORAL | Status: DC

## 2014-03-12 MED ORDER — ALBUTEROL SULFATE (2.5 MG/3ML) 0.083% IN NEBU: 2.5000 mg | INHALATION_SOLUTION | RESPIRATORY_TRACT | Status: DC | PRN

## 2014-03-12 MED ORDER — CARVEDILOL 6.25 MG PO TABS: 6.2500 mg | ORAL_TABLET | Freq: Two times a day (BID) | ORAL | Status: DC

## 2014-03-12 MED ORDER — METFORMIN HCL 500 MG PO TABS: 500.0000 mg | ORAL_TABLET | Freq: Two times a day (BID) | ORAL | Status: DC

## 2014-03-12 MED ORDER — BUDESONIDE-FORMOTEROL FUMARATE 80-4.5 MCG/ACT IN AERO: 2.0000 | INHALATION_SPRAY | Freq: Two times a day (BID) | RESPIRATORY_TRACT | Status: DC | PRN

## 2014-03-12 MED ORDER — ALBUTEROL SULFATE HFA 108 (90 BASE) MCG/ACT IN AERS: 2.0000 | INHALATION_SPRAY | Freq: Four times a day (QID) | RESPIRATORY_TRACT | Status: DC | PRN

## 2014-03-12 MED ORDER — GLUCOSE BLOOD VI STRP: ORAL_STRIP | Status: DC

## 2014-03-12 MED ORDER — NICOTINE 21 MG/24HR TD PT24: 21.0000 mg | MEDICATED_PATCH | Freq: Every day | TRANSDERMAL | Status: DC

## 2014-03-12 NOTE — Discharge Summary (Signed)
Physician Discharge Summary  [name] [id number] DOB: May 15, 1951 DOA: 03/08/2014  PCP: [name], MD  Admit date: 03/08/2014 Discharge date: 03/12/2014  Time spent: >35 minutes  Recommendations for Outpatient Follow-up:  F/u with PCP in 1 week  Discharge Diagnoses:  Principal Problem:   COPD (chronic obstructive pulmonary disease) Active Problems:   DIABETES MELLITUS, TYPE II   TOBACCO ABUSE   Essential hypertension, benign   Bronchitis, chronic obstructive w acute bronchitis   Respiratory failure   Discharge Condition: stable   Diet recommendation: DM  Filed Weights   03/08/14 2118 03/09/14 2129 03/10/14 2119  Weight: 97.1 kg (214 lb 1.1 oz) 99.7 kg (219 lb 12.8 oz) 101.5 kg (223 lb 12.3 oz)    History of present illness:  63 y.o. female patient is a pleasant 62 year old female with a past medical history of tobacco abuse, chronic obstructive pulmonary disease/asthma, presented to the emergent apartment with complaints of shortness of breath, cough, and wheezing that has progressively worsened over the past several days   Principal Problem:  COPD (chronic obstructive pulmonary disease)  Active Problems:  Respiratory failure  DIABETES MELLITUS, TYPE II  TOBACCO ABUSE  Essential hypertension, benign  Bronchitis, chronic obstructive w acute bronchitis   Hospital Course:  1. COPD exacerbation; CXR no clear infiltrates; active tobacco use; 30 year history of tobacco abuse -symptoms improved on IV steroids, atx, bronchodilators, oxygen; stop smoking; taper steroids upon d/c  2. Acute respiratory failure likely due to COPD; resolved; cont as above 3. Type 2 diabetes mellitus. Uncontrolled; started insulin regimen inpatient; reccommended to cont insulin regimen while on steroids, but patient refused;  -resume metformin, added glipizide PO while on steroids; recommended to check glucose as scheduled at home, and f/u with PCP in 1 week to adjust medications as  needed;  4. Hypertension. Cont home therapy.    Procedures:  none (i.e. Studies not automatically included, echos, thoracentesis, etc; not x-rays)  Consultations:  none  Discharge Exam: Filed Vitals:   03/12/14 0417  BP: 170/81  Pulse: 70  Temp: 98.8 F (37.1 C)  Resp: 17    General: alert Cardiovascular: s1,s2 rrr Respiratory: CTA BL  Discharge Instructions     Medication List    STOP taking these medications       pravastatin 40 MG tablet  Commonly known as:  PRAVACHOL      TAKE these medications       albuterol 108 (90 BASE) MCG/ACT inhaler  Commonly known as:  PROVENTIL HFA;VENTOLIN HFA  Inhale 2 puffs into the lungs every 4 (four) hours as needed for wheezing.     albuterol (2.5 MG/3ML) 0.083% nebulizer solution  Commonly known as:  PROVENTIL  Take 3 mLs (2.5 mg total) by nebulization every 4 (four) hours as needed for wheezing or shortness of breath.     albuterol 108 (90 BASE) MCG/ACT inhaler  Commonly known as:  VENTOLIN HFA  Inhale 2 puffs into the lungs every 6 (six) hours as needed for wheezing or shortness of breath.     budesonide-formoterol 80-4.5 MCG/ACT inhaler  Commonly known as:  SYMBICORT  Inhale 2 puffs into the lungs 2 (two) times daily as needed. For asthma     carvedilol 6.25 MG tablet  Commonly known as:  COREG  Take 1 tablet (6.25 mg total) by mouth 2 (two) times daily with a meal.     citalopram 40 MG tablet  Commonly known as:  CELEXA  Take 1 tablet (40 mg total) by  mouth daily.     glipiZIDE 5 MG tablet  Commonly known as:  GLUCOTROL  Take 1 tablet (5 mg total) by mouth daily before breakfast.     glucose blood test strip  Use as instructed     levofloxacin 750 MG tablet  Commonly known as:  LEVAQUIN  Take 1 tablet (750 mg total) by mouth daily.     lisinopril-hydrochlorothiazide 20-25 MG per tablet  Commonly known as:  PRINZIDE,ZESTORETIC  Take 1 tablet by mouth every morning.     loratadine 10 MG tablet   Commonly known as:  CLARITIN  Take 1 tablet (10 mg total) by mouth daily.     metFORMIN 500 MG tablet  Commonly known as:  GLUCOPHAGE  Take 1 tablet (500 mg total) by mouth 2 (two) times daily with a meal.     nicotine 21 mg/24hr patch  Commonly known as:  EQ NICOTINE  Place 1 patch (21 mg total) onto the skin daily.     predniSONE 10 MG tablet  Commonly known as:  DELTASONE  Take 1 tablet (10 mg total) by mouth daily with breakfast.       No Known Allergies     Follow-up Information   Follow up with [name], MD. Schedule an appointment as soon as possible for a visit in 1 week.   Specialty:  Internal Medicine   Contact information:   [place]  [address] [phone]        The results of significant diagnostics from this hospitalization (including imaging, microbiology, ancillary and laboratory) are listed below for reference.    Significant Diagnostic Studies: X-ray Chest Pa And Lateral   03/09/2014   CLINICAL DATA:  Cough and shortness of breath. Right lower rib pain.  EXAM: CHEST  2 VIEW  COMPARISON:  03/08/2014  FINDINGS: Surgical clips noted in the right axilla. The heart size and mediastinal contours are within normal limits. Both lungs are clear. The visualized skeletal structures are unremarkable.  IMPRESSION: 1. No acute cardiopulmonary abnormalities.   Electronically Signed   By: Kerby Moors M.D.   On: 03/09/2014 09:17   Dg Chest Port 1 View  03/08/2014   CLINICAL DATA:  Shortness of breath, cough and wheezing.  EXAM: PORTABLE CHEST - 1 VIEW  COMPARISON:  Chest radiograph from 11/30/2013  FINDINGS: The lungs are mildly hyperexpanded. Mild vascular congestion is noted. No definite focal consolidation, pleural effusion or pneumothorax is seen, though the left lung base is incompletely imaged on this study.  The cardiomediastinal silhouette is within normal limits. No acute osseous abnormalities are seen. Cervical spinal fusion hardware is  noted. Clips are seen overlying the right axilla.  IMPRESSION: Mild vascular congestion noted; lungs remain grossly clear.   Electronically Signed   By: Garald Balding M.D.   On: 03/08/2014 06:30    Microbiology: No results found for this or any previous visit (from the past 240 hour(s)).   Labs: Basic Metabolic Panel:  Recent Labs Lab 03/08/14 0545 03/09/14 0555  NA 140 136*  K 3.6* 4.6  CL 98 100  CO2 26 23  GLUCOSE 102* 350*  BUN 8 10  CREATININE 0.71 0.62  CALCIUM 9.4 8.9   Liver Function Tests:  Recent Labs Lab 03/08/14 0545  AST 18  ALT 8  ALKPHOS 106  BILITOT 0.3  PROT 7.9  ALBUMIN 3.8   No results found for this basename: LIPASE, AMYLASE,  in the last 168 hours No results found for  this basename: AMMONIA,  in the last 168 hours CBC:  Recent Labs Lab 03/08/14 0545 03/09/14 0555  WBC 5.6 9.3  HGB 12.3 11.4*  HCT 36.0 34.4*  MCV 85.3 86.9  PLT 341 297   Cardiac Enzymes: No results found for this basename: CKTOTAL, CKMB, CKMBINDEX, TROPONINI,  in the last 168 hours BNP: BNP (last 3 results)  Recent Labs  03/08/14 0545  PROBNP 51.2   CBG:  Recent Labs Lab 03/09/14 2131 03/10/14 0759 03/10/14 1211 03/10/14 2116 03/11/14 0745  GLUCAP 192* 318* 290* 222* 299*       Signed:  Arie Sabina Tay Whitwell  [facility] 03/12/2014, 9:59 AM

## 2014-03-12 NOTE — Progress Notes (Signed)
Discharge documentation reviewed with pt; allowing time for questions. Pt verbalized understanding. Prescriptions given to pt. IV removed without issue. Tele box removed and CCMD notified of pt's discharge. Pt waiting for husband to come pick her up.

## 2014-03-22 ENCOUNTER — Other Ambulatory Visit: Payer: Self-pay | Admitting: Internal Medicine

## 2014-03-24 NOTE — Telephone Encounter (Signed)
[name] has never been seen by me and has not been to the clinic since 12/2012.  Apparently she is in the process of searching for a new PCP.  I will provide a 30 day supply of medication but I cannot continue to provide refills if she does not make an appointment with me or if she chooses to find another provider she may obtain refills from her new provider. Thanks.

## 2014-03-27 ENCOUNTER — Telehealth: Payer: Self-pay | Admitting: *Deleted

## 2014-03-27 NOTE — Telephone Encounter (Signed)
Pt called and message left on voice mail to call clinic for an appointment.

## 2014-04-04 NOTE — Telephone Encounter (Signed)
closed

## 2014-04-09 ENCOUNTER — Ambulatory Visit (INDEPENDENT_AMBULATORY_CARE_PROVIDER_SITE_OTHER): Payer: Medicare Other | Admitting: Internal Medicine

## 2014-04-09 ENCOUNTER — Encounter: Payer: Self-pay | Admitting: Internal Medicine

## 2014-04-09 VITALS — BP 115/73 | HR 90 | Temp 98.4°F | Ht 67.4 in | Wt 213.9 lb

## 2014-04-09 DIAGNOSIS — F17200 Nicotine dependence, unspecified, uncomplicated: Secondary | ICD-10-CM | POA: Diagnosis not present

## 2014-04-09 DIAGNOSIS — M545 Low back pain, unspecified: Secondary | ICD-10-CM | POA: Insufficient documentation

## 2014-04-09 DIAGNOSIS — M549 Dorsalgia, unspecified: Secondary | ICD-10-CM | POA: Diagnosis not present

## 2014-04-09 DIAGNOSIS — E119 Type 2 diabetes mellitus without complications: Secondary | ICD-10-CM | POA: Diagnosis not present

## 2014-04-09 DIAGNOSIS — Z09 Encounter for follow-up examination after completed treatment for conditions other than malignant neoplasm: Secondary | ICD-10-CM

## 2014-04-09 LAB — GLUCOSE, CAPILLARY: GLUCOSE-CAPILLARY: 138 mg/dL — AB (ref 70–99)

## 2014-04-09 MED ORDER — ACCU-CHEK NANO SMARTVIEW W/DEVICE KIT: PACK | Status: DC

## 2014-04-09 MED ORDER — MELOXICAM 7.5 MG PO TABS: 7.5000 mg | ORAL_TABLET | Freq: Every day | ORAL | Status: DC

## 2014-04-09 MED ORDER — NICOTINE 21 MG/24HR TD PT24: 21.0000 mg | MEDICATED_PATCH | Freq: Every day | TRANSDERMAL | Status: DC

## 2014-04-09 MED ORDER — CYCLOBENZAPRINE HCL 5 MG PO TABS: 5.0000 mg | ORAL_TABLET | Freq: Three times a day (TID) | ORAL | Status: DC | PRN

## 2014-04-09 MED ORDER — GLUCOSE BLOOD VI STRP: ORAL_STRIP | Status: DC

## 2014-04-09 MED ORDER — ACCU-CHEK FASTCLIX LANCETS MISC: Status: DC

## 2014-04-09 NOTE — Patient Instructions (Signed)
General Instructions:   Please bring your medicines with you each time you come to clinic.  Medicines may include prescription medications, over-the-counter medications, herbal remedies, eye drops, vitamins, or other pills.   Progress Toward Treatment Goals:  Treatment Goal 01/03/2013  Hemoglobin A1C at goal  Blood pressure at goal  Stop smoking smoking less    Self Care Goals & Plans:  Self Care Goal 01/03/2013  Manage my medications take my medicines as prescribed; bring my medications to every visit; refill my medications on time  Monitor my health keep track of my blood glucose; bring my glucose meter and log to each visit  Eat healthy foods drink diet soda or water instead of juice or soda; eat more vegetables  Be physically active -  Stop smoking -    Home Blood Glucose Monitoring 01/03/2013  Check my blood sugar 3 times a day  When to check my blood sugar before dinner; before meals     Care Management & Community Referrals:  Referral 01/03/2013  Referrals made for care management support none needed

## 2014-04-09 NOTE — Progress Notes (Signed)
Subjective:    Patient ID: [name], female    DOB: January 25, 1951, 63 y.o.   MRN: [id number]  HPI [name] is a 63 yo woman pmh as listed below presents for hospital follow up.   Pt was hospitalized 03/12/14 for COPD exacerbation complicated by uncontrolled DM. The patient takes metformin, glyipizide and completed her steriod taper. Since that time the patient states she feels good and without complaints. Her breathing is back to her baseline and she denies any cough, sob, doe, pnd, LE edema, or chest pain. She completed her prednisone taper and Abx as prescribed. She doesn't bring in her meter today for CBG readings and hasn't been able to check them given her lack of test strips. She does know her hypoglycemic symptoms and has not had any recently. She has continued to smoke but less than her usual and does have a committed plan and support with her husband to quit smoking but she was unable to fill her nicotine patch prescriptions from her last hospitalization. Also in terms of her diabetes she states that she saw her ophthalmologist in January for new eyeglass prescriptions. She would like a referral to podiatry today to do a foot exam.  Her only main concern today is she injured her back while working in the yard. She states she had a back injury several years ago "slipped to discs and was put in an immobilizer and that was it." She has not done any physical therapy since that time to improve her back pain and usually takes it easy when she does physical activity but states that she "overdid it." She has been taking BC powders anywhere between 2 per day to help with the pain and that has helped. She has not tried heat or ice or any other over-the-counter remedies. She does state that sometimes at night when she's lying flat she'll have "a fluttering of her back." She denied any numbness or tingling radiating from her back down her legs, no leg weakness, no change in gait, no urinary or stool  incontinence.  Past Medical History  Diagnosis Date  . Hyperlipidemia   . Hypertension   . Tobacco abuse   . COPD (chronic obstructive pulmonary disease)     History of multiple hospital admissions for exercabation   . Asthma   . Breast cancer 1991    s/p lumpectomy, chemotherapy and radiation therapy in 1991. Mammogram in 2007 was normal.  . Sigmoid diverticulitis 80/2008  . Anxiety   . Depression   . Obesity   . GERD (gastroesophageal reflux disease)   . Heart murmur 10/05/11    "first time I ever heard I had one was today"  . Pneumonia   . Shortness of breath 10/05/11    "at rest; lying down; w/exertion"  . Diabetes mellitus   . Bronchitis     h/o  . Diarrhea     h/o  . Constipated     h/o   Current Outpatient Prescriptions on File Prior to Visit  Medication Sig Dispense Refill  . albuterol (PROVENTIL HFA;VENTOLIN HFA) 108 (90 BASE) MCG/ACT inhaler Inhale 2 puffs into the lungs every 4 (four) hours as needed for wheezing.  1 Inhaler  11  . albuterol (PROVENTIL) (2.5 MG/3ML) 0.083% nebulizer solution Take 3 mLs (2.5 mg total) by nebulization every 4 (four) hours as needed for wheezing or shortness of breath.  180 vial  1  . albuterol (VENTOLIN HFA) 108 (90 BASE) MCG/ACT inhaler Inhale 2 puffs  into the lungs every 6 (six) hours as needed for wheezing or shortness of breath.  6 Inhaler  1  . budesonide-formoterol (SYMBICORT) 80-4.5 MCG/ACT inhaler Inhale 2 puffs into the lungs 2 (two) times daily as needed. For asthma  1 Inhaler  12  . carvedilol (COREG) 6.25 MG tablet Take 1 tablet (6.25 mg total) by mouth 2 (two) times daily with a meal.  60 tablet  1  . citalopram (CELEXA) 40 MG tablet TAKE 1 TABLET BY MOUTH DAILY  30 tablet  0  . glipiZIDE (GLUCOTROL) 5 MG tablet Take 1 tablet (5 mg total) by mouth daily before breakfast.  30 tablet  0  . lisinopril-hydrochlorothiazide (PRINZIDE,ZESTORETIC) 20-25 MG per tablet TAKE 1 TABLET BY MOUTH EVERY MORNING  30 tablet  0  .  loratadine (CLARITIN) 10 MG tablet Take 1 tablet (10 mg total) by mouth daily.  30 tablet  1  . metFORMIN (GLUCOPHAGE) 500 MG tablet Take 1 tablet (500 mg total) by mouth 2 (two) times daily with a meal.  60 tablet  1  . [DISCONTINUED] albuterol (PROVENTIL,VENTOLIN) 90 MCG/ACT inhaler Inhale 2 puffs into the lungs every 6 (six) hours as needed for wheezing.  17 g  12   No current facility-administered medications on file prior to visit.   Social, surgical, family history reviewed with patient and updated in appropriate chart locations.   Review of Systems General ROS: negative for - chills, fatigue, fever or night sweats Respiratory ROS: no cough, shortness of breath, or wheezing Cardiovascular ROS: no chest pain or dyspnea on exertion Gastrointestinal ROS: no abdominal pain, change in bowel habits, or black or bloody stools Genito-Urinary ROS: no dysuria, trouble voiding, or hematuria Musculoskeletal ROS: positive for - muscle pain and pain in back - lower negative for - gait disturbance, joint stiffness, joint swelling or muscular weakness Neurological ROS: negative for - bowel and bladder control changes, gait disturbance, headaches, impaired coordination/balance, numbness/tingling or weakness     Objective:   Physical Exam Filed Vitals:   04/09/14 0845  BP: 115/73  Pulse: 90  Temp: 98.4 F (36.9 C)   General: sitting in chair, NAD  HEENT: PERRL, EOMI, no scleral icterus Cardiac: RRR, no rubs, murmurs or gallops Pulm: clear to auscultation bilaterally, slight expiratory wheezes, no crackles or rhonchi moving normal volumes of air Abd: soft, nontender, nondistended, BS present Ext: warm and well perfused, no pedal edema MSK: back FROM, nttp over spinal cord, no paraspinal tenderness, able to walk on heels and tip toes w/o limitation, no SI joint tenderness, negative straight leg raise Neuro: alert and oriented X3, cranial nerves II-XII grossly intact    Assessment & Plan:    Please see problem oriented charting  Pt discussed with Dr. Marinda Elk

## 2014-04-09 NOTE — Assessment & Plan Note (Addendum)
This is likely muscular skeletal in nature as the patient has no reflex symptoms for neurological compromise or compression. The patient's physical exam findings and symptoms don't present any red warning signs to warrant immediate radiological imaging at this point. the patient does not want to pursue physical therapy. The patient has been using over-the-counter BC powders as well as Tylenol for pain relief with only minimal alleviation. -Flexeril, Mobic -Back exercises provided for the patient as well as symptomatic relief with heat, ice, massage -Consider referral to physical therapy when patient is amenable

## 2014-04-09 NOTE — Assessment & Plan Note (Signed)
Patient interested in quitting. -Nicoderm patches refilled -Smoking cessation information given

## 2014-04-09 NOTE — Assessment & Plan Note (Signed)
Patient did not bring in her meter today and her last hemoglobin A1c was 03/09/14 at 7.6. Patient has completed her steroid taper at this time and states that she is only taking metformin 500 mg twice a day and she intermittently stopped taking her glipizide 5 mg daily given concern for "drug interaction." -Followup in one month for CBG readings and medication titration -Referral for podiatry done today after foot exam -Patient did see her ophthalmologist in January of 2015 but would like to do the retinal scan this order was placed -Patient to continue both metformin and glipizide

## 2014-04-10 ENCOUNTER — Telehealth: Payer: Self-pay | Admitting: *Deleted

## 2014-04-10 ENCOUNTER — Other Ambulatory Visit: Payer: Self-pay | Admitting: Internal Medicine

## 2014-04-10 NOTE — Telephone Encounter (Signed)
Returned pt's call - request Mobic and Flexeril rxs call to [facility] on [address]; which was done per pt's request.

## 2014-04-11 NOTE — Addendum Note (Signed)
Addended by: Yvonna Alanis E on: 04/11/2014 02:03 PM   Modules accepted: Orders

## 2014-04-15 NOTE — Progress Notes (Signed)
Case discussed with Dr. Sadek soon after the resident saw the patient.  We reviewed the resident's history and exam and pertinent patient test results.  I agree with the assessment, diagnosis, and plan of care documented in the resident's note. 

## 2014-04-17 ENCOUNTER — Other Ambulatory Visit: Payer: Self-pay | Admitting: Internal Medicine

## 2014-04-18 ENCOUNTER — Telehealth: Payer: Self-pay | Admitting: *Deleted

## 2014-04-18 DIAGNOSIS — L608 Other nail disorders: Secondary | ICD-10-CM | POA: Diagnosis not present

## 2014-04-18 DIAGNOSIS — E119 Type 2 diabetes mellitus without complications: Secondary | ICD-10-CM | POA: Diagnosis not present

## 2014-04-18 MED ORDER — FLUTICASONE PROPIONATE 50 MCG/ACT NA SUSP: 2.0000 | Freq: Every day | NASAL | Status: DC | PRN

## 2014-04-18 NOTE — Telephone Encounter (Signed)
Looks like if fell off her med list when she was admitted to the hospital this year.  Will refill.   Thanks

## 2014-04-18 NOTE — Telephone Encounter (Signed)
Fax from the pharmacy - requesting refill on Fliticasone 50 mcg use 2 sprays in each nostril daily. Qty 16  Not on current med list.  Called pt  - no answer.

## 2014-05-09 ENCOUNTER — Other Ambulatory Visit: Payer: Self-pay | Admitting: Internal Medicine

## 2014-05-13 ENCOUNTER — Encounter: Payer: Medicare Other | Admitting: Internal Medicine

## 2014-05-16 ENCOUNTER — Other Ambulatory Visit: Payer: Self-pay | Admitting: Internal Medicine

## 2014-05-19 ENCOUNTER — Encounter: Payer: Self-pay | Admitting: Internal Medicine

## 2014-05-20 ENCOUNTER — Encounter: Payer: Medicare Other | Admitting: Internal Medicine

## 2014-05-31 DIAGNOSIS — H40039 Anatomical narrow angle, unspecified eye: Secondary | ICD-10-CM | POA: Diagnosis not present

## 2014-05-31 DIAGNOSIS — E119 Type 2 diabetes mellitus without complications: Secondary | ICD-10-CM | POA: Diagnosis not present

## 2014-06-03 ENCOUNTER — Encounter: Payer: Self-pay | Admitting: Internal Medicine

## 2014-06-10 ENCOUNTER — Other Ambulatory Visit: Payer: Self-pay | Admitting: Internal Medicine

## 2014-06-14 ENCOUNTER — Ambulatory Visit (INDEPENDENT_AMBULATORY_CARE_PROVIDER_SITE_OTHER): Payer: Medicare Other | Admitting: Internal Medicine

## 2014-06-14 ENCOUNTER — Other Ambulatory Visit: Payer: Self-pay | Admitting: Internal Medicine

## 2014-06-14 ENCOUNTER — Encounter: Payer: Self-pay | Admitting: Internal Medicine

## 2014-06-14 VITALS — BP 141/83 | HR 97 | Temp 99.6°F | Ht 67.0 in | Wt 217.2 lb

## 2014-06-14 DIAGNOSIS — Z23 Encounter for immunization: Secondary | ICD-10-CM | POA: Diagnosis not present

## 2014-06-14 DIAGNOSIS — J9600 Acute respiratory failure, unspecified whether with hypoxia or hypercapnia: Secondary | ICD-10-CM | POA: Diagnosis not present

## 2014-06-14 DIAGNOSIS — Z853 Personal history of malignant neoplasm of breast: Secondary | ICD-10-CM

## 2014-06-14 DIAGNOSIS — F17200 Nicotine dependence, unspecified, uncomplicated: Secondary | ICD-10-CM | POA: Diagnosis not present

## 2014-06-14 DIAGNOSIS — I10 Essential (primary) hypertension: Secondary | ICD-10-CM

## 2014-06-14 DIAGNOSIS — E119 Type 2 diabetes mellitus without complications: Secondary | ICD-10-CM | POA: Diagnosis not present

## 2014-06-14 DIAGNOSIS — J440 Chronic obstructive pulmonary disease with acute lower respiratory infection: Secondary | ICD-10-CM | POA: Diagnosis not present

## 2014-06-14 DIAGNOSIS — F3289 Other specified depressive episodes: Secondary | ICD-10-CM

## 2014-06-14 DIAGNOSIS — Z299 Encounter for prophylactic measures, unspecified: Secondary | ICD-10-CM | POA: Diagnosis not present

## 2014-06-14 DIAGNOSIS — E1169 Type 2 diabetes mellitus with other specified complication: Secondary | ICD-10-CM

## 2014-06-14 DIAGNOSIS — J069 Acute upper respiratory infection, unspecified: Secondary | ICD-10-CM | POA: Diagnosis not present

## 2014-06-14 DIAGNOSIS — R0609 Other forms of dyspnea: Secondary | ICD-10-CM | POA: Diagnosis not present

## 2014-06-14 DIAGNOSIS — J020 Streptococcal pharyngitis: Secondary | ICD-10-CM | POA: Diagnosis not present

## 2014-06-14 DIAGNOSIS — J111 Influenza due to unidentified influenza virus with other respiratory manifestations: Secondary | ICD-10-CM | POA: Diagnosis not present

## 2014-06-14 DIAGNOSIS — G4700 Insomnia, unspecified: Secondary | ICD-10-CM | POA: Diagnosis not present

## 2014-06-14 DIAGNOSIS — J449 Chronic obstructive pulmonary disease, unspecified: Secondary | ICD-10-CM | POA: Diagnosis not present

## 2014-06-14 DIAGNOSIS — F329 Major depressive disorder, single episode, unspecified: Secondary | ICD-10-CM

## 2014-06-14 DIAGNOSIS — L282 Other prurigo: Secondary | ICD-10-CM

## 2014-06-14 DIAGNOSIS — E785 Hyperlipidemia, unspecified: Secondary | ICD-10-CM | POA: Insufficient documentation

## 2014-06-14 DIAGNOSIS — M25519 Pain in unspecified shoulder: Secondary | ICD-10-CM | POA: Diagnosis not present

## 2014-06-14 DIAGNOSIS — Z0000 Encounter for general adult medical examination without abnormal findings: Secondary | ICD-10-CM | POA: Diagnosis not present

## 2014-06-14 DIAGNOSIS — L659 Nonscarring hair loss, unspecified: Secondary | ICD-10-CM | POA: Diagnosis not present

## 2014-06-14 DIAGNOSIS — J441 Chronic obstructive pulmonary disease with (acute) exacerbation: Secondary | ICD-10-CM | POA: Diagnosis not present

## 2014-06-14 DIAGNOSIS — J45901 Unspecified asthma with (acute) exacerbation: Secondary | ICD-10-CM | POA: Diagnosis not present

## 2014-06-14 DIAGNOSIS — R928 Other abnormal and inconclusive findings on diagnostic imaging of breast: Secondary | ICD-10-CM | POA: Diagnosis not present

## 2014-06-14 DIAGNOSIS — J410 Simple chronic bronchitis: Secondary | ICD-10-CM | POA: Diagnosis not present

## 2014-06-14 DIAGNOSIS — J984 Other disorders of lung: Secondary | ICD-10-CM | POA: Diagnosis not present

## 2014-06-14 DIAGNOSIS — J4489 Other specified chronic obstructive pulmonary disease: Secondary | ICD-10-CM

## 2014-06-14 DIAGNOSIS — R071 Chest pain on breathing: Secondary | ICD-10-CM | POA: Diagnosis not present

## 2014-06-14 LAB — POCT GLYCOSYLATED HEMOGLOBIN (HGB A1C): HEMOGLOBIN A1C: 6.8

## 2014-06-14 LAB — GLUCOSE, CAPILLARY: GLUCOSE-CAPILLARY: 149 mg/dL — AB (ref 70–99)

## 2014-06-14 MED ORDER — VARENICLINE TARTRATE 0.5 MG X 11 & 1 MG X 42 PO MISC: ORAL | Status: DC

## 2014-06-14 MED ORDER — HYDROCORTISONE 2.5 % EX CREA: TOPICAL_CREAM | Freq: Two times a day (BID) | CUTANEOUS | Status: DC | PRN

## 2014-06-14 NOTE — Patient Instructions (Addendum)
Thank you for your visit today.   Please return to the internal medicine clinic in 3 month(s) or sooner if needed.   You may use over-the-counter hydrocortisone cream for itching and benadryl as needed.  I can refer you to dermatology if needed--please call if this persists.   Your current medical regimen is effective;  continue present plan and take all medications as prescribed.    I have sent your medication refills to your pharmacy.   I have made the following referrals for you: mammogram, colonoscopy.   I have given you a prescription for shingles vaccine.  Please be sure to bring all of your medications with you to every visit; this includes herbal supplements, vitamins, eye drops, and any over-the-counter medications.   Should you have any questions regarding your medications and/or any new or worsening symptoms, please be sure to call the clinic at [phone].   If you believe that you are suffering from a life threatening condition or one that may result in the loss of limb or function, then you should call 911 or proceed to the nearest Emergency Department.     A healthy lifestyle and preventative care can promote health and wellness.   Maintain regular health, dental, and eye exams.  Eat a healthy diet. Foods like vegetables, fruits, whole grains, low-fat dairy products, and lean protein foods contain the nutrients you need without too many calories. Decrease your intake of foods high in solid fats, added sugars, and salt. Get information about a proper diet from your caregiver, if necessary.  Regular physical exercise is one of the most important things you can do for your health. Most adults should get at least 150 minutes of moderate-intensity exercise (any activity that increases your heart rate and causes you to sweat) each week. In addition, most adults need muscle-strengthening exercises on 2 or more days a week.   Maintain a healthy weight. The body mass index  (BMI) is a screening tool to identify possible weight problems. It provides an estimate of body fat based on height and weight. Your caregiver can help determine your BMI, and can help you achieve or maintain a healthy weight. For adults 20 years and older:  A BMI below 18.5 is considered underweight.  A BMI of 18.5 to 24.9 is normal.  A BMI of 25 to 29.9 is considered overweight.  A BMI of 30 and above is considered obese.

## 2014-06-14 NOTE — Assessment & Plan Note (Signed)
-  referral for diagnostic mammogram

## 2014-06-14 NOTE — Assessment & Plan Note (Signed)
Pt complains of a pruritic rash on her upper extremities bilaterally.  I did not appreciate a rash but states that her arms are itchy.  She has tried OTC hydrocortisone cream without much relief.  Reports she had a similar rash last year when she was out working in her garden but has not been in her garden this year.  She uses Jergens lotion which I advised against this due to the fragrance. I suggested she try aquaphor, cerave, cetaphil.  Denies a h/o eczema.  She will consider a referral to dermatology.  -continue to try OTC hydrocortisone -consider a stronger strength steroid cream -consider referral to dermatology -try benadryl OTC for itching

## 2014-06-14 NOTE — Assessment & Plan Note (Signed)
>>  ASSESSMENT AND PLAN FOR PERSONAL HISTORY OF BREAST CANCER WRITTEN ON 06/14/2014 11:36 AM BY Jones Bales, MD  -referral for diagnostic mammogram

## 2014-06-14 NOTE — Assessment & Plan Note (Signed)
BP Readings from Last 3 Encounters:  06/14/14 141/83  04/09/14 115/73  03/12/14 150/76    Lab Results  Component Value Date   NA 136* 03/09/2014   K 4.6 03/09/2014   CREATININE 0.62 03/09/2014    Assessment: Blood pressure control: controlled Progress toward BP goal:  at goal Comments: compliance with zestoretic 20-25mg  once daily, carvedilol 6.25mg  twice daily.   Plan: Medications:  continue current medications as above Educational resources provided: brochure Other plans: obtain CMP today

## 2014-06-14 NOTE — Assessment & Plan Note (Signed)
-  currently on celexa 40mg  daily but doesn't feel that it helps much -will discuss on next visit

## 2014-06-14 NOTE — Progress Notes (Signed)
Attending physician note: Presenting problems, physical findings, medications, reviewed with resident physician Dr. Loyal Jacobson and I concur with her assessment and management plan. Murriel Hopper, M.D., [place]

## 2014-06-14 NOTE — Progress Notes (Signed)
Patient ID: [name], female   DOB: 07-31-1951, 63 y.o.   MRN: [id number]     Subjective:   [name]    DOB: 13-Sep-1951 63 y.o.    MRN: [id number] Health Maintenance Due: Health Maintenance Due  Topic Date Due  . Pap Smear  12/30/1968  . Colonoscopy  12/30/2000  . Zostavax  12/30/2010  . Lipid Panel  12/01/2013  . Influenza Vaccine  06/08/2014  . Hemoglobin A1c  06/09/2014    _________________________________________________  HPI: [name] is a 63 y.o. female here for a routine visit.  Pt has a PMH outlined below.  Please see problem-based charting assessment and plan note for further details of medical issues addressed at today's visit.  PMH: Past Medical History  Diagnosis Date  . Hyperlipidemia   . Hypertension   . Tobacco abuse   . COPD (chronic obstructive pulmonary disease)     History of multiple hospital admissions for exercabation   . Asthma   . Breast cancer 1991    s/p lumpectomy, chemotherapy and radiation therapy in 1991. Mammogram in 2007 was normal.  . Sigmoid diverticulitis 80/2008  . Anxiety   . Depression   . Obesity   . GERD (gastroesophageal reflux disease)   . Heart murmur 10/05/11    "first time I ever heard I had one was today"  . Pneumonia   . Shortness of breath 10/05/11    "at rest; lying down; w/exertion"  . Diabetes mellitus   . Bronchitis     h/o  . Diarrhea     h/o  . Constipated     h/o    Medications: Current Outpatient Prescriptions on File Prior to Visit  Medication Sig Dispense Refill  . ACCU-CHEK FASTCLIX LANCETS MISC Check blood sugar once a day as directed  102 each  5  . albuterol (PROVENTIL HFA;VENTOLIN HFA) 108 (90 BASE) MCG/ACT inhaler Inhale 2 puffs into the lungs every 4 (four) hours as needed for wheezing.  1 Inhaler  11  . Blood Glucose Monitoring Suppl (ACCU-CHEK NANO SMARTVIEW) W/DEVICE KIT Check blood sugar once a day as directed  1 kit  1  .  budesonide-formoterol (SYMBICORT) 80-4.5 MCG/ACT inhaler Inhale 2 puffs into the lungs 2 (two) times daily as needed. For asthma  1 Inhaler  12  . carvedilol (COREG) 6.25 MG tablet TAKE 1 TABLET BY MOUTH TWICE DAILY WITH A MEAL  30 tablet  0  . citalopram (CELEXA) 40 MG tablet TAKE 1 TABLET BY MOUTH DAILY  30 tablet  0  . cyclobenzaprine (FLEXERIL) 5 MG tablet Take 1 tablet (5 mg total) by mouth at bedtime as needed for muscle spasms.  30 tablet  0  . fluticasone (FLONASE) 50 MCG/ACT nasal spray Place 2 sprays into both nostrils daily as needed for allergies.  16 g  3  . glipiZIDE (GLUCOTROL) 5 MG tablet Take 1 tablet (5 mg total) by mouth daily before breakfast.  30 tablet  0  . glucose blood (ACCU-CHEK SMARTVIEW) test strip Check blood sugar once a day as directed  50 each  5  . lisinopril-hydrochlorothiazide (PRINZIDE,ZESTORETIC) 20-25 MG per tablet TAKE 1 TABLET BY MOUTH EVERY MORNING  30 tablet  0  . loratadine (CLARITIN) 10 MG tablet Take 1 tablet (10 mg total) by mouth daily.  30 tablet  1  . meloxicam (MOBIC) 7.5 MG tablet Take 1 tablet (7.5 mg total) by mouth daily as needed for pain.  30 tablet  0  . metFORMIN (GLUCOPHAGE) 500 MG tablet Take 1 tablet (500 mg total) by mouth 2 (two) times daily with a meal.  60 tablet  1  . nicotine (EQ NICOTINE) 21 mg/24hr patch Place 1 patch (21 mg total) onto the skin daily.  28 patch  0  . VENTOLIN HFA 108 (90 BASE) MCG/ACT inhaler INHALE 2 PUFFS INTO THE LUNGS EVERY 6 (SIX) HOURS AS NEEDED FOR WHEEZING.  18 g  0  . [DISCONTINUED] albuterol (PROVENTIL,VENTOLIN) 90 MCG/ACT inhaler Inhale 2 puffs into the lungs every 6 (six) hours as needed for wheezing.  17 g  12   No current facility-administered medications on file prior to visit.    Allergies: No Known Allergies  FH: Family History  Problem Relation Age of Onset  . Cancer Mother     SH: History   Social History  . Marital Status: Married    Spouse Name: N/A    Number of Children: N/A    . Years of Education: N/A   Social History Main Topics  . Smoking status: Current Some Day Smoker -- 0.40 packs/day for 40 years    Types: Cigarettes  . Smokeless tobacco: Never Used     Comment: given QUIT line info - husband states he will quit when she does  . Alcohol Use: 2.4 oz/week    4 Cans of beer per week     Comment: "only on the weekends"  . Drug Use: No  . Sexual Activity: None   Other Topics Concern  . None   Social History Narrative   Lives in [place] with her husband.   Takes care of 3 grand children.   Trying to find a job, has financial difficulties.          Review of Systems: Constitutional: Negative for fever, chills and weight loss.  Eyes: Negative for blurred vision.  Respiratory: Negative for cough and shortness of breath.  Cardiovascular: Negative for chest pain, palpitations and leg swelling.  Gastrointestinal: Negative for nausea, vomiting, abdominal pain, diarrhea, constipation and blood in stool.  Genitourinary: Negative for dysuria, urgency and frequency.  Musculoskeletal: Negative for myalgias and back pain.  Neurological: Negative for dizziness, weakness and headaches.     Objective:   Vital Signs: Filed Vitals:   06/14/14 1040  BP: 141/83  Pulse: 97  Temp: 99.6 F (37.6 C)  TempSrc: Oral  Height: _0  (1.702 m)  Weight: 217 lb 3.2 oz (98.521 kg)  SpO2: 99%      BP Readings from Last 3 Encounters:  06/14/14 141/83  04/09/14 115/73  03/12/14 150/76    Physical Exam: Constitutional: Vital signs reviewed.  Patient is well-developed and well-nourished in NAD and cooperative with exam.  Head: Normocephalic and atraumatic. Eyes: PERRL, EOMI, conjunctivae nl, no scleral icterus.  Neck: Supple. Cardiovascular: RRR, no MRG. Pulmonary/Chest: normal effort, non-tender to palpation, CTAB, no wheezes, rales, or rhonchi. Abdominal: Soft. NT/ND +BS. Neurological: A&O x3, cranial nerves II-XII are grossly intact, moving all  extremities. Extremities: 2+DP b/l; no pitting edema. Skin: Warm, dry and intact. No rash.  Most Recent Laboratory Results:  CMP     Component Value Date/Time   NA 136* 03/09/2014 0555   K 4.6 03/09/2014 0555   CL 100 03/09/2014 0555   CO2 23 03/09/2014 0555   GLUCOSE 350* 03/09/2014 0555   BUN 10 03/09/2014 0555   CREATININE 0.62 03/09/2014 0555   CREATININE 0.73 12/20/2012 1110   CALCIUM 8.9 03/09/2014 0555   PROT  7.9 03/08/2014 0545   ALBUMIN 3.8 03/08/2014 0545   AST 18 03/08/2014 0545   ALT 8 03/08/2014 0545   ALKPHOS 106 03/08/2014 0545   BILITOT 0.3 03/08/2014 0545   GFRNONAA >90 03/09/2014 0555   GFRNONAA 89 12/20/2012 1110   GFRAA >90 03/09/2014 0555   GFRAA >89 12/20/2012 1110    CBC    Component Value Date/Time   WBC 9.3 03/09/2014 0555   RBC 3.96 03/09/2014 0555   HGB 11.4* 03/09/2014 0555   HCT 34.4* 03/09/2014 0555   PLT 297 03/09/2014 0555   MCV 86.9 03/09/2014 0555   MCH 28.8 03/09/2014 0555   MCHC 33.1 03/09/2014 0555   RDW 12.9 03/09/2014 0555   LYMPHSABS 2.1 12/20/2012 1110   MONOABS 0.8 12/20/2012 1110   EOSABS 0.0 12/20/2012 1110   BASOSABS 0.0 12/20/2012 1110    Lipid Panel Lab Results  Component Value Date   CHOL 166 12/01/2012   HDL 43 12/01/2012   LDLCALC 92 12/01/2012   TRIG 154* 12/01/2012   CHOLHDL 3.9 12/01/2012    HA1C Lab Results  Component Value Date   HGBA1C 7.6* 03/09/2014    Urinalysis    Component Value Date/Time   COLORURINE YELLOW 02/15/2012 1208   APPEARANCEUR CLEAR 02/15/2012 1208   LABSPEC 1.013 02/15/2012 1208   PHURINE 6.0 02/15/2012 1208   GLUCOSEU NEGATIVE 02/15/2012 [place] 02/15/2012 [place] 02/15/2012 1208   KETONESUR NEGATIVE 02/15/2012 1208   PROTEINUR NEGATIVE 02/15/2012 1208   UROBILINOGEN 1.0 02/15/2012 1208   NITRITE NEGATIVE 02/15/2012 1208   LEUKOCYTESUR NEGATIVE 02/15/2012 1208    Urine Microalbumin Lab Results  Component Value Date   MICROALBUR 1.98* 12/01/2012    Imaging N/A   Assessment & Plan:   Assessment and plan was  discussed and formulated with my attending.  Patient should return to [facility] in 3-4 month(s).   Topics to be addressed at next visit include: 1. Depression 2. Alopecia  3. Resolution of rash with possible referral to dermatology

## 2014-06-14 NOTE — Assessment & Plan Note (Addendum)
Current taking symbicort 2 puffs twice daily.  No symptoms.  However, I do not see documentation of PFTs and she will need this repeated.  -refill ventolin  -order PFTs on next office visit

## 2014-06-14 NOTE — Assessment & Plan Note (Addendum)
-  referral for mammogram -referral for colonoscopy -gave prescription for zostavax  -retinal scan at next visit

## 2014-06-14 NOTE — Assessment & Plan Note (Signed)
>>  ASSESSMENT AND PLAN FOR GAD (GENERALIZED ANXIETY DISORDER) WRITTEN ON 06/14/2014  2:55 PM BY GILL, JACQUELYN S, MD  -currently on celexa 40mg  daily but doesn't feel that it helps much -will discuss on next visit

## 2014-06-14 NOTE — Assessment & Plan Note (Signed)
Lab Results  Component Value Date   HGBA1C 6.8 06/14/2014   HGBA1C 7.6* 03/09/2014   HGBA1C 7.8 12/01/2012     Assessment: Diabetes control: good control (HgbA1C at goal) Progress toward A1C goal:  at goal  Plan: Medications:  continue current medications of metformin 500mg  twice daily  Instruction/counseling given: other instruction/counseling: exercise Educational resources provided: brochure Other plans: great job with blood sugars

## 2014-06-14 NOTE — Assessment & Plan Note (Addendum)
-  will check TSH -trial of rogaine  -referral to dermatology if needed and/or desires

## 2014-06-14 NOTE — Assessment & Plan Note (Signed)
-  check lipid panel -continue pravastatin 40mg  at bedtime

## 2014-06-15 ENCOUNTER — Other Ambulatory Visit: Payer: Self-pay | Admitting: Internal Medicine

## 2014-06-15 LAB — LIPID PANEL
CHOLESTEROL: 170 mg/dL (ref 0–200)
HDL: 46 mg/dL (ref >39)
LDL CALC: 91 mg/dL (ref 0–99)
TRIGLYCERIDES: 166 mg/dL — AB (ref <150)
Total CHOL/HDL Ratio: 3.7 Ratio
VLDL: 33 mg/dL (ref 0–40)

## 2014-06-15 LAB — TSH: TSH: 1.257 u[IU]/mL (ref 0.350–4.500)

## 2014-06-22 ENCOUNTER — Emergency Department (INDEPENDENT_AMBULATORY_CARE_PROVIDER_SITE_OTHER)
Admission: EM | Admit: 2014-06-22 | Discharge: 2014-06-22 | Disposition: A | Discharge status: Home / Self Care | Payer: Medicare Other | Source: Home / Self Care | Attending: Family Medicine | Admitting: Family Medicine

## 2014-06-22 ENCOUNTER — Encounter (HOSPITAL_COMMUNITY): Payer: Self-pay | Admitting: Emergency Medicine

## 2014-06-22 DIAGNOSIS — L255 Unspecified contact dermatitis due to plants, except food: Secondary | ICD-10-CM | POA: Diagnosis not present

## 2014-06-22 MED ORDER — FAMOTIDINE 20 MG PO TABS
40.0000 mg | ORAL_TABLET | Freq: Once | ORAL | Status: AC
  Administered 2014-06-22: 40 mg via ORAL

## 2014-06-22 MED ORDER — DEXAMETHASONE 10 MG/ML FOR PEDIATRIC ORAL USE
INTRAMUSCULAR | Status: AC
  Filled 2014-06-22: qty 1

## 2014-06-22 MED ORDER — FAMOTIDINE 20 MG PO TABS
ORAL_TABLET | ORAL | Status: AC
  Filled 2014-06-22: qty 1

## 2014-06-22 MED ORDER — DEXAMETHASONE SODIUM PHOSPHATE 10 MG/ML IJ SOLN
10.0000 mg | Freq: Once | INTRAMUSCULAR | Status: AC
  Administered 2014-06-22: 10 mg via INTRAMUSCULAR

## 2014-06-22 NOTE — ED Provider Notes (Signed)
CSN: [id number]     Arrival date & time 06/22/14  1324 History   First MD Initiated Contact with Patient 06/22/14 [phone]     Chief Complaint  Patient presents with  . Skin Problem   (Consider location/radiation/quality/duration/timing/severity/associated sxs/prior Treatment) HPI Comments: Patient reports that she was weeding and pruning her garden 9 days ago and since that time she has had an extremely pruritic rash on both of her forearms that has not responded favorably to topical treatments at home (Calamine lotion, topical cortisone). Denies swelling of face, lips or tongue. No wheezing or dyspnea.  The history is provided by the patient.    Past Medical History  Diagnosis Date  . Hyperlipidemia   . Hypertension   . Tobacco abuse   . COPD (chronic obstructive pulmonary disease)     History of multiple hospital admissions for exercabation   . Asthma   . Breast cancer 1991    s/p lumpectomy, chemotherapy and radiation therapy in 1991. Mammogram in 2007 was normal.  . Sigmoid diverticulitis 80/2008  . Anxiety   . Depression   . Obesity   . GERD (gastroesophageal reflux disease)   . Heart murmur 10/05/11    "first time I ever heard I had one was today"  . Pneumonia   . Shortness of breath 10/05/11    "at rest; lying down; w/exertion"  . Diabetes mellitus   . Bronchitis     h/o  . Diarrhea     h/o  . Constipated     h/o   Past Surgical History  Procedure Laterality Date  . Dobutamine stress echo  08/2004    Inferior ischemia, normal LV systolic function, no significant CAD  . Abdominal hysterectomy    . Breast surgery  1991    lumphectomy right breast  . Neck surgery  2012    "Dr. Lynann Bologna  put plate in; did something to my vertebrae"   Family History  Problem Relation Age of Onset  . Cancer Mother    History  Substance Use Topics  . Smoking status: Current Some Day Smoker -- 0.40 packs/day for 40 years    Types: Cigarettes  . Smokeless tobacco: Never Used      Comment: given QUIT line info - husband states he will quit when she does  . Alcohol Use: 2.4 oz/week    4 Cans of beer per week     Comment: "only on the weekends"   OB History   Grav Para Term Preterm Abortions TAB SAB Ect Mult Living                 Review of Systems  All other systems reviewed and are negative.   Allergies  Review of patient's allergies indicates no known allergies.  Home Medications   Prior to Admission medications   Medication Sig Start Date End Date Taking? Authorizing Provider  ACCU-CHEK FASTCLIX LANCETS MISC Check blood sugar once a day as directed 04/09/14   Clinton Gallant, MD  albuterol (PROVENTIL HFA;VENTOLIN HFA) 108 (90 BASE) MCG/ACT inhaler Inhale 2 puffs into the lungs every 4 (four) hours as needed for wheezing. 02/23/12 02/22/13  Delsa Sale, MD  Blood Glucose Monitoring Suppl (ACCU-CHEK NANO SMARTVIEW) W/DEVICE KIT Check blood sugar once a day as directed 04/09/14   Clinton Gallant, MD  budesonide-formoterol [facility]) 80-4.5 MCG/ACT inhaler Inhale 2 puffs into the lungs 2 (two) times daily as needed. For asthma 03/12/14   Kinnie Feil, MD  carvedilol (COREG) 6.25 MG  tablet TAKE 1 TABLET BY MOUTH TWICE DAILY WITH A MEAL    Jones Bales, MD  citalopram (CELEXA) 40 MG tablet TAKE 1 TABLET BY MOUTH DAILY    Jones Bales, MD  cyclobenzaprine (FLEXERIL) 5 MG tablet Take 1 tablet (5 mg total) by mouth at bedtime as needed for muscle spasms. 05/11/14   Jones Bales, MD  fluticasone (FLONASE) 50 MCG/ACT nasal spray Place 2 sprays into both nostrils daily as needed for allergies. 04/18/14   Sid Falcon, MD  glipiZIDE (GLUCOTROL) 5 MG tablet Take 1 tablet (5 mg total) by mouth daily before breakfast. 03/12/14   Kinnie Feil, MD  glucose blood (ACCU-CHEK SMARTVIEW) test strip Check blood sugar once a day as directed 04/09/14   Clinton Gallant, MD  hydrocortisone 2.5 % cream Apply topically 2 (two) times daily as needed. for rash on arms 06/14/14   Jones Bales, MD  lisinopril-hydrochlorothiazide (PRINZIDE,ZESTORETIC) 20-25 MG per tablet TAKE 1 TABLET BY MOUTH EVERY MORNING    Jones Bales, MD  loratadine (CLARITIN) 10 MG tablet Take 1 tablet (10 mg total) by mouth daily. 02/16/13   Madaline Brilliant, MD  meloxicam (MOBIC) 7.5 MG tablet Take 1 tablet (7.5 mg total) by mouth daily as needed for pain.    Jones Bales, MD  metFORMIN (GLUCOPHAGE) 500 MG tablet Take 1 tablet (500 mg total) by mouth 2 (two) times daily with a meal. 03/12/14   Kinnie Feil, MD  nicotine (EQ NICOTINE) 21 mg/24hr patch Place 1 patch (21 mg total) onto the skin daily. 04/09/14   Clinton Gallant, MD  pravastatin (PRAVACHOL) 40 MG tablet  06/10/14   Historical Provider, MD  varenicline (CHANTIX STARTING MONTH PAK) 0.5 MG X 11 & 1 MG X 42 tablet Take one 0.5 mg tablet by mouth once daily for 3 days, then increase to one 0.5 mg tablet twice daily for 4 days, then increase to one 1 mg tablet twice daily. 06/14/14   Jones Bales, MD  VENTOLIN HFA 108 (90 BASE) MCG/ACT inhaler INHALE 2 PUFFS INTO THE LUNGS EVERY 6 (SIX) HOURS AS NEEDED FOR WHEEZING.    Jones Bales, MD   BP 169/89  Pulse 102  Temp(Src) 99.3 F (37.4 C) (Oral)  Resp 18  SpO2 96% Physical Exam  Nursing note and vitals reviewed. Constitutional: She is oriented to person, place, and time. She appears well-developed and well-nourished. No distress.  HENT:  Head: Normocephalic and atraumatic.  Eyes: Conjunctivae are normal. No scleral icterus.  Cardiovascular: Normal rate.   Pulmonary/Chest: Effort normal.  Musculoskeletal: Normal range of motion.  Neurological: She is alert and oriented to person, place, and time.  Skin: Skin is warm and dry.  urticarial contact dermatitis at bilateral forearms  Psychiatric: She has a normal mood and affect. Her behavior is normal.    ED Course  Procedures (including critical care time) Labs Review Labs Reviewed - No data to display  Imaging Review No results  found.   MDM   1. Contact dermatitis due to plant    [facility] use oral benadryl at home for pruritis and to follow up with PCP if symptoms persist.   JeLutricia FeilPA 06/22/14 1026

## 2014-06-22 NOTE — ED Notes (Signed)
States she has an itchy problem on her arms again today. Last time was 2 weeks ago when she watered her dumb cane plant , thinks she got the plant sap on her arms that caused her problems

## 2014-06-22 NOTE — Discharge Instructions (Signed)

## 2014-06-22 NOTE — ED Provider Notes (Signed)
Medical screening examination/treatment/procedure(s) were performed by resident physician or non-physician practitioner and as supervising physician I was immediately available for consultation/collaboration.   Pauline Good MD.   Billy Fischer, MD 06/22/14 1245

## 2014-07-01 ENCOUNTER — Encounter: Payer: Self-pay | Admitting: Internal Medicine

## 2014-07-12 ENCOUNTER — Other Ambulatory Visit: Payer: Self-pay | Admitting: Internal Medicine

## 2014-07-22 ENCOUNTER — Other Ambulatory Visit: Payer: Self-pay | Admitting: Internal Medicine

## 2014-07-27 ENCOUNTER — Emergency Department (HOSPITAL_COMMUNITY)
Admission: EM | Admit: 2014-07-27 | Discharge: 2014-07-27 | Disposition: A | Discharge status: Home / Self Care | Payer: Medicare Other | Attending: Emergency Medicine | Admitting: Emergency Medicine

## 2014-07-27 ENCOUNTER — Emergency Department (HOSPITAL_COMMUNITY): Payer: Medicare Other

## 2014-07-27 ENCOUNTER — Encounter (HOSPITAL_COMMUNITY): Payer: Self-pay | Admitting: Emergency Medicine

## 2014-07-27 DIAGNOSIS — J441 Chronic obstructive pulmonary disease with (acute) exacerbation: Secondary | ICD-10-CM | POA: Insufficient documentation

## 2014-07-27 DIAGNOSIS — R011 Cardiac murmur, unspecified: Secondary | ICD-10-CM | POA: Diagnosis not present

## 2014-07-27 DIAGNOSIS — Z853 Personal history of malignant neoplasm of breast: Secondary | ICD-10-CM | POA: Insufficient documentation

## 2014-07-27 DIAGNOSIS — Z79899 Other long term (current) drug therapy: Secondary | ICD-10-CM | POA: Insufficient documentation

## 2014-07-27 DIAGNOSIS — F3289 Other specified depressive episodes: Secondary | ICD-10-CM | POA: Insufficient documentation

## 2014-07-27 DIAGNOSIS — F17200 Nicotine dependence, unspecified, uncomplicated: Secondary | ICD-10-CM | POA: Insufficient documentation

## 2014-07-27 DIAGNOSIS — I10 Essential (primary) hypertension: Secondary | ICD-10-CM | POA: Diagnosis not present

## 2014-07-27 DIAGNOSIS — R0602 Shortness of breath: Secondary | ICD-10-CM | POA: Insufficient documentation

## 2014-07-27 DIAGNOSIS — R071 Chest pain on breathing: Secondary | ICD-10-CM | POA: Diagnosis not present

## 2014-07-27 DIAGNOSIS — J45901 Unspecified asthma with (acute) exacerbation: Secondary | ICD-10-CM | POA: Diagnosis not present

## 2014-07-27 DIAGNOSIS — F411 Generalized anxiety disorder: Secondary | ICD-10-CM | POA: Insufficient documentation

## 2014-07-27 DIAGNOSIS — E669 Obesity, unspecified: Secondary | ICD-10-CM | POA: Diagnosis not present

## 2014-07-27 DIAGNOSIS — Z8701 Personal history of pneumonia (recurrent): Secondary | ICD-10-CM | POA: Insufficient documentation

## 2014-07-27 DIAGNOSIS — F329 Major depressive disorder, single episode, unspecified: Secondary | ICD-10-CM | POA: Insufficient documentation

## 2014-07-27 DIAGNOSIS — E785 Hyperlipidemia, unspecified: Secondary | ICD-10-CM | POA: Insufficient documentation

## 2014-07-27 DIAGNOSIS — Z8719 Personal history of other diseases of the digestive system: Secondary | ICD-10-CM | POA: Diagnosis not present

## 2014-07-27 DIAGNOSIS — E119 Type 2 diabetes mellitus without complications: Secondary | ICD-10-CM | POA: Diagnosis not present

## 2014-07-27 DIAGNOSIS — J449 Chronic obstructive pulmonary disease, unspecified: Secondary | ICD-10-CM | POA: Diagnosis not present

## 2014-07-27 LAB — BASIC METABOLIC PANEL
Anion gap: 13 (ref 5–15)
BUN: 5 mg/dL — AB (ref 6–23)
CALCIUM: 9.3 mg/dL (ref 8.4–10.5)
CO2: 28 meq/L (ref 19–32)
CREATININE: 0.61 mg/dL (ref 0.50–1.10)
Chloride: 100 mEq/L (ref 96–112)
GFR calc Af Amer: >90 mL/min (ref >90)
GFR calc non Af Amer: >90 mL/min (ref >90)
GLUCOSE: 163 mg/dL — AB (ref 70–99)
Potassium: 3.4 mEq/L — ABNORMAL LOW (ref 3.7–5.3)
Sodium: 141 mEq/L (ref 137–147)

## 2014-07-27 LAB — CBC WITH DIFFERENTIAL/PLATELET
Basophils Absolute: 0 10*3/uL (ref 0.0–0.1)
Basophils Relative: 0 % (ref 0–1)
EOS ABS: 0.4 10*3/uL (ref 0.0–0.7)
EOS PCT: 7 % — AB (ref 0–5)
HCT: 35.7 % — ABNORMAL LOW (ref 36.0–46.0)
Hemoglobin: 11.9 g/dL — ABNORMAL LOW (ref 12.0–15.0)
LYMPHS ABS: 1.4 10*3/uL (ref 0.7–4.0)
Lymphocytes Relative: 26 % (ref 12–46)
MCH: 28.1 pg (ref 26.0–34.0)
MCHC: 33.3 g/dL (ref 30.0–36.0)
MCV: 84.2 fL (ref 78.0–100.0)
MONO ABS: 0.3 10*3/uL (ref 0.1–1.0)
MONOS PCT: 6 % (ref 3–12)
Neutro Abs: 3.2 10*3/uL (ref 1.7–7.7)
Neutrophils Relative %: 61 % (ref 43–77)
PLATELETS: 302 10*3/uL (ref 150–400)
RBC: 4.24 MIL/uL (ref 3.87–5.11)
RDW: 13.2 % (ref 11.5–15.5)
WBC: 5.2 10*3/uL (ref 4.0–10.5)

## 2014-07-27 LAB — I-STAT TROPONIN, ED: Troponin i, poc: 0 ng/mL (ref 0.00–0.08)

## 2014-07-27 MED ORDER — FENTANYL CITRATE 0.05 MG/ML IJ SOLN
100.0000 ug | Freq: Once | INTRAMUSCULAR | Status: AC
  Administered 2014-07-27: 100 ug via NASAL
  Filled 2014-07-27: qty 2

## 2014-07-27 MED ORDER — IPRATROPIUM BROMIDE 0.02 % IN SOLN
0.5000 mg | Freq: Once | RESPIRATORY_TRACT | Status: AC
  Administered 2014-07-27: 0.5 mg via RESPIRATORY_TRACT

## 2014-07-27 MED ORDER — ALBUTEROL SULFATE (2.5 MG/3ML) 0.083% IN NEBU
5.0000 mg | INHALATION_SOLUTION | Freq: Once | RESPIRATORY_TRACT | Status: AC
  Administered 2014-07-27: 5 mg via RESPIRATORY_TRACT

## 2014-07-27 MED ORDER — IPRATROPIUM BROMIDE 0.02 % IN SOLN
0.5000 mg | Freq: Once | RESPIRATORY_TRACT | Status: AC
  Administered 2014-07-27: 0.5 mg via RESPIRATORY_TRACT
  Filled 2014-07-27: qty 2.5

## 2014-07-27 MED ORDER — PREDNISONE 20 MG PO TABS
60.0000 mg | ORAL_TABLET | Freq: Once | ORAL | Status: AC
  Administered 2014-07-27: 60 mg via ORAL
  Filled 2014-07-27: qty 3

## 2014-07-27 MED ORDER — ALBUTEROL (5 MG/ML) CONTINUOUS INHALATION SOLN
10.0000 mg/h | INHALATION_SOLUTION | RESPIRATORY_TRACT | Status: DC
  Administered 2014-07-27: 10 mg/h via RESPIRATORY_TRACT
  Filled 2014-07-27: qty 20

## 2014-07-27 MED ORDER — PREDNISONE 20 MG PO TABS: ORAL_TABLET | ORAL | Status: DC

## 2014-07-27 MED ORDER — SULFAMETHOXAZOLE-TRIMETHOPRIM 800-160 MG PO TABS: 1.0000 | ORAL_TABLET | Freq: Two times a day (BID) | ORAL | Status: DC

## 2014-07-27 MED ORDER — ALBUTEROL SULFATE (2.5 MG/3ML) 0.083% IN NEBU
INHALATION_SOLUTION | RESPIRATORY_TRACT | Status: DC
Start: 2014-07-27 — End: 2014-07-27
  Filled 2014-07-27: qty 6

## 2014-07-27 MED ORDER — HYDROCODONE-ACETAMINOPHEN 5-325 MG PO TABS
2.0000 | ORAL_TABLET | Freq: Once | ORAL | Status: AC
  Administered 2014-07-27: 2 via ORAL
  Filled 2014-07-27: qty 2

## 2014-07-27 MED ORDER — IPRATROPIUM BROMIDE 0.02 % IN SOLN
RESPIRATORY_TRACT | Status: AC
  Administered 2014-07-27: 0.5 mg via RESPIRATORY_TRACT
  Filled 2014-07-27: qty 2.5

## 2014-07-27 NOTE — ED Notes (Signed)
Pt in from home c/o SOB with hx of [place], pt c/o bil ribcage pain worse with respirations, pt speaks in short sentences

## 2014-07-27 NOTE — ED Notes (Signed)
Bednar, MD at bedside. 

## 2014-07-27 NOTE — ED Provider Notes (Signed)
CSN: [id number]     Arrival date & time 07/27/14  0705 History   First MD Initiated Contact with Patient 07/27/14 0710     Chief Complaint  Patient presents with  . Shortness of Breath     (Consider location/radiation/quality/duration/timing/severity/associated sxs/prior Treatment) HPI 63 year old female with history of COPD he still smokes and was told to discontinue complains of a few days of coughing wheezing shortness of breath not getting better with her home nebulizer her last steroids were several months ago she has never been intubated for COPD according to the patient she does have constant chest pain 24 hours a day for the last few days when she coughs or moves her chest wall she is no fever no confusion no abdominal pain no vomiting no rash no other concerns. Her shortness of breath is moderately severe and constant for the last few days gradually worsening. Past Medical History  Diagnosis Date  . Hyperlipidemia   . Hypertension   . Tobacco abuse   . COPD (chronic obstructive pulmonary disease)     History of multiple hospital admissions for exercabation   . Asthma   . Breast cancer 1991    s/p lumpectomy, chemotherapy and radiation therapy in 1991. Mammogram in 2007 was normal.  . Sigmoid diverticulitis 80/2008  . Anxiety   . Depression   . Obesity   . GERD (gastroesophageal reflux disease)   . Heart murmur 10/05/11    "first time I ever heard I had one was today"  . Pneumonia   . Shortness of breath 10/05/11    "at rest; lying down; w/exertion"  . Diabetes mellitus   . Bronchitis     h/o  . Diarrhea     h/o  . Constipated     h/o   Past Surgical History  Procedure Laterality Date  . Dobutamine stress echo  08/2004    Inferior ischemia, normal LV systolic function, no significant CAD  . Abdominal hysterectomy    . Breast surgery  1991    lumphectomy right breast  . Neck surgery  2012    "Dr. Lynann Bologna  put plate in; did something to my vertebrae"   Family  History  Problem Relation Age of Onset  . Cancer Mother    History  Substance Use Topics  . Smoking status: Current Some Day Smoker -- 0.40 packs/day for 40 years    Types: Cigarettes  . Smokeless tobacco: Never Used     Comment: given QUIT line info - husband states he will quit when she does  . Alcohol Use: 2.4 oz/week    4 Cans of beer per week     Comment: "only on the weekends"   OB History   Grav Para Term Preterm Abortions TAB SAB Ect Mult Living                 Review of Systems  10 Systems reviewed and are negative for acute change except as noted in the HPI.  Allergies  Review of patient's allergies indicates no known allergies.  Home Medications   Prior to Admission medications   Medication Sig Start Date End Date Taking? Authorizing Provider  ACCU-CHEK FASTCLIX LANCETS MISC 1 each by Other route See admin instructions. Check blood sugar daily as needed for high blood sugar.   Yes Historical Provider, MD  albuterol (PROVENTIL HFA;VENTOLIN HFA) 108 (90 BASE) MCG/ACT inhaler Inhale 2 puffs into the lungs every 4 (four) hours as needed for wheezing. 02/23/12 07/27/14 Yes Leory Plowman  Vladimir Creeks, MD  Blood Glucose Monitoring Suppl (ACCU-CHEK NANO SMARTVIEW) W/DEVICE KIT 1 each by Other route See admin instructions. Check blood sugar daily as needed for high blood sugar.   Yes Historical Provider, MD  budesonide-formoterol (SYMBICORT) 80-4.5 MCG/ACT inhaler Inhale 2 puffs into the lungs 2 (two) times daily as needed. For asthma 03/12/14  Yes Kinnie Feil, MD  carvedilol (COREG) 6.25 MG tablet Take 6.25 mg by mouth 2 (two) times daily with a meal.   Yes Historical Provider, MD  citalopram (CELEXA) 40 MG tablet Take 40 mg by mouth daily.   Yes Historical Provider, MD  fluticasone (FLONASE) 50 MCG/ACT nasal spray Place 1 spray into both nostrils daily as needed for allergies.   Yes Historical Provider, MD  glucose blood (ACCU-CHEK SMARTVIEW) test strip 1 each by Other route See  admin instructions. Check blood sugar daily as needed for high blood sugar.   Yes Historical Provider, MD  lisinopril-hydrochlorothiazide (PRINZIDE,ZESTORETIC) 20-25 MG per tablet Take 1 tablet by mouth daily.   Yes Historical Provider, MD  metFORMIN (GLUCOPHAGE) 500 MG tablet Take 500 mg by mouth 2 (two) times daily with a meal.   Yes Historical Provider, MD  pravastatin (PRAVACHOL) 40 MG tablet Take 40 mg by mouth daily.   Yes Historical Provider, MD  albuterol (PROVENTIL) (2.5 MG/3ML) 0.083% nebulizer solution Take 3 mLs (2.5 mg total) by nebulization every 6 (six) hours as needed for wheezing or shortness of breath. 08/07/14   Jones Bales, MD  albuterol (VENTOLIN HFA) 108 (90 BASE) MCG/ACT inhaler Inhale 2 puffs into the lungs every 6 (six) hours as needed for wheezing or shortness of breath. 08/07/14   Jones Bales, MD  predniSONE (DELTASONE) 20 MG tablet 2 tabs po daily x 4 days 07/27/14   Babette Relic, MD  sulfamethoxazole-trimethoprim (BACTRIM DS,SEPTRA DS) 800-160 MG per tablet Take 1 tablet by mouth 2 (two) times daily. 07/27/14   Babette Relic, MD   BP 169/62  Pulse 100  Temp(Src) 98.4 F (36.9 C) (Oral)  Resp 19  Ht _0  (1.702 m)  Wt 210 lb (95.255 kg)  BMI 32.88 kg/m2  SpO2 98% Physical Exam  Nursing note and vitals reviewed. Constitutional:  Awake, alert, nontoxic appearance but moderate respiratory distress.  HENT:  Head: Atraumatic.  Eyes: Right eye exhibits no discharge. Left eye exhibits no discharge.  Neck: Neck supple.  Cardiovascular: Regular rhythm.   No murmur heard. Mild tachycardia  Pulmonary/Chest: She is in respiratory distress. She has wheezes. She has no rales. She exhibits tenderness.  Moderately severe respiratory distress speaks short sentences with mild retractions mild accessory muscle usage pulse oximetry normal room air 98% diffuse wheezes with decreased breath sounds bilaterally without crackles; has reproducible chest wall tenderness   Abdominal: Soft. There is no tenderness. There is no rebound.  Musculoskeletal: She exhibits no edema and no tenderness.  Baseline ROM, no obvious new focal weakness.  Neurological: She is alert.  Mental status and motor strength appears baseline for patient and situation.  Skin: No rash noted.  Psychiatric: She has a normal mood and affect.    ED Course  Procedures (including critical care time) Pt feels improved after observation and/or treatment in ED.Patient / Family / Caregiver informed of clinical course, understand medical decision-making process, and agree with plan. Labs Review Labs Reviewed  CBC WITH DIFFERENTIAL - Abnormal; Notable for the following:    Hemoglobin 11.9 (*)    HCT 35.7 (*)  Eosinophils Relative 7 (*)    All other components within normal limits  BASIC METABOLIC PANEL - Abnormal; Notable for the following:    Potassium 3.4 (*)    Glucose, Bld 163 (*)    BUN 5 (*)    All other components within normal limits  I-STAT TROPOININ, ED    Imaging Review No results found. Dg Chest 2 View  07/27/2014   CLINICAL DATA:  Worsening asthma.  COPD.  EXAM: CHEST  2 VIEW  COMPARISON:  03/09/2014.  FINDINGS: Interval mildly enlarged cardiac silhouette. The lungs remain hyperexpanded minimally prominent interstitial markings. Stable linear scarring at the left lung base. Interval small amount of linear atelectasis or scarring at the right lung base. Old, healed right rib fractures. Right axillary surgical clips. Bilateral shoulder degenerative changes. Superior migration of both humeral heads, suggesting chronic rotator cuff tears. Cervical spine fixation hardware. Thoracic spine degenerative changes.  IMPRESSION: 1. No acute abnormality. 2. Stable changes of COPD.   Electronically Signed   By: Enrique Sack M.D.   On: 07/27/2014 09:22   EKG Interpretation   Date/Time:  Saturday July 27 2014 07:18:24 EDT Ventricular Rate:  103 PR Interval:  152 QRS Duration: 86 QT  Interval:  359 QTC Calculation: 470 R Axis:   96 Text Interpretation:  Sinus tachycardia Right atrial enlargement Baseline  wander in lead(s) II III aVL aVF No significant change since last tracing  Confirmed by [facility]  MD, Jenny Reichmann ([id number]) on 07/27/2014 7:26:26 AM      MDM   Final diagnoses:  COPD exacerbation    I doubt any other EMC precluding discharge at this time including, but not necessarily limited to the following:sepsis, AMI.    Babette Relic, MD 08/07/14 [phone]

## 2014-07-27 NOTE — Discharge Instructions (Signed)
°  RETURN IMMEDIATELY IF you develop worse shortness of breath, confusion or altered mental status, a new rash, become dizzy, faint, or poorly responsive, or are unable to be cared for at home. °

## 2014-08-01 ENCOUNTER — Telehealth: Payer: Self-pay | Admitting: *Deleted

## 2014-08-05 ENCOUNTER — Other Ambulatory Visit: Payer: Self-pay | Admitting: Internal Medicine

## 2014-08-08 ENCOUNTER — Ambulatory Visit (INDEPENDENT_AMBULATORY_CARE_PROVIDER_SITE_OTHER): Payer: Medicare Other | Admitting: Internal Medicine

## 2014-08-08 ENCOUNTER — Encounter: Payer: Self-pay | Admitting: Internal Medicine

## 2014-08-08 ENCOUNTER — Ambulatory Visit (HOSPITAL_COMMUNITY)
Admission: RE | Admit: 2014-08-08 | Discharge: 2014-08-08 | Disposition: A | Discharge status: Home / Self Care | Payer: Medicare Other | Source: Ambulatory Visit | Attending: Internal Medicine | Admitting: Internal Medicine

## 2014-08-08 VITALS — BP 140/72 | HR 93 | Temp 98.5°F | Ht 67.0 in | Wt 219.8 lb

## 2014-08-08 DIAGNOSIS — I4581 Long QT syndrome: Secondary | ICD-10-CM | POA: Insufficient documentation

## 2014-08-08 DIAGNOSIS — R9431 Abnormal electrocardiogram [ECG] [EKG]: Secondary | ICD-10-CM | POA: Insufficient documentation

## 2014-08-08 DIAGNOSIS — J441 Chronic obstructive pulmonary disease with (acute) exacerbation: Secondary | ICD-10-CM

## 2014-08-08 DIAGNOSIS — Z720 Tobacco use: Secondary | ICD-10-CM

## 2014-08-08 DIAGNOSIS — F17200 Nicotine dependence, unspecified, uncomplicated: Secondary | ICD-10-CM

## 2014-08-08 HISTORY — DX: Abnormal electrocardiogram (ECG) (EKG): R94.31

## 2014-08-08 MED ORDER — LEVOFLOXACIN 500 MG PO TABS: 500.0000 mg | ORAL_TABLET | Freq: Every day | ORAL | Status: DC

## 2014-08-08 MED ORDER — SALINE SPRAY 0.65 % NA SOLN: 1.0000 | NASAL | Status: DC | PRN

## 2014-08-08 MED ORDER — IPRATROPIUM BROMIDE 0.02 % IN SOLN
0.5000 mg | Freq: Once | RESPIRATORY_TRACT | Status: AC
  Administered 2014-08-08: 0.5 mg via RESPIRATORY_TRACT

## 2014-08-08 MED ORDER — ALBUTEROL SULFATE (2.5 MG/3ML) 0.083% IN NEBU
2.5000 mg | INHALATION_SOLUTION | Freq: Once | RESPIRATORY_TRACT | Status: AC
  Administered 2014-08-08: 2.5 mg via RESPIRATORY_TRACT

## 2014-08-08 MED ORDER — PREDNISONE 20 MG PO TABS: 40.0000 mg | ORAL_TABLET | Freq: Every day | ORAL | Status: DC

## 2014-08-08 NOTE — Assessment & Plan Note (Signed)
Pt on Citalopram and starting Levaquin which can both prolong her QTc. Previous EKG w/ QTc 470s. Repeat EKG with QTc 451, so QTc has normalized.

## 2014-08-08 NOTE — Progress Notes (Signed)
Patient ID: [name], female   DOB: 1951-06-01, 63 y.o.   MRN: [id number]  Subjective:   [name]   DOB: Mar 31, 1951 63 y.o.   MRN: [id number]  HPI: [name] is a 63 y.o. F w/ PMH tobacco abuse and COPD presents with likely COPD exacerbation.   She was seen in the ED on 9/19 with SOB and wheezing and was treated with nebulizer treatments, steroids, which she completed, and Bactrim BID, which she is only taking qday. She states that she has a family member with a URI who got her sick.   She presents today with persistent SOB and wheezing. She is coughing clear-yellow mucus and is blowing out yellow mucus. Her nebulizer is not helping when she uses it 1x q6h, but only used it 2x yesterday. She is only using her symbicort once a day. She denies fevers but has been having sweats.   She is still smoking 7-8 cigarettes a day, which has been cut back even more while she has been sick.    Past Medical History  Diagnosis Date  . Hyperlipidemia   . Hypertension   . Tobacco abuse   . COPD (chronic obstructive pulmonary disease)     History of multiple hospital admissions for exercabation   . Asthma   . Breast cancer 1991    s/p lumpectomy, chemotherapy and radiation therapy in 1991. Mammogram in 2007 was normal.  . Sigmoid diverticulitis 80/2008  . Anxiety   . Depression   . Obesity   . GERD (gastroesophageal reflux disease)   . Heart murmur 10/05/11    "first time I ever heard I had one was today"  . Pneumonia   . Shortness of breath 10/05/11    "at rest; lying down; w/exertion"  . Diabetes mellitus   . Bronchitis     h/o  . Diarrhea     h/o  . Constipated     h/o   Current Outpatient Prescriptions  Medication Sig Dispense Refill  . ACCU-CHEK FASTCLIX LANCETS MISC 1 each by Other route See admin instructions. Check blood sugar daily as needed for high blood sugar.      . albuterol (PROVENTIL HFA;VENTOLIN HFA) 108 (90 BASE) MCG/ACT  inhaler Inhale 2 puffs into the lungs every 4 (four) hours as needed for wheezing.  1 Inhaler  11  . albuterol (PROVENTIL) (2.5 MG/3ML) 0.083% nebulizer solution Take 3 mLs (2.5 mg total) by nebulization every 6 (six) hours as needed for wheezing or shortness of breath.  1 vial  0  . albuterol (VENTOLIN HFA) 108 (90 BASE) MCG/ACT inhaler Inhale 2 puffs into the lungs every 6 (six) hours as needed for wheezing or shortness of breath.  1 Inhaler  3  . Blood Glucose Monitoring Suppl (ACCU-CHEK NANO SMARTVIEW) W/DEVICE KIT 1 each by Other route See admin instructions. Check blood sugar daily as needed for high blood sugar.      . budesonide-formoterol (SYMBICORT) 80-4.5 MCG/ACT inhaler Inhale 2 puffs into the lungs 2 (two) times daily as needed. For asthma  1 Inhaler  12  . carvedilol (COREG) 6.25 MG tablet Take 6.25 mg by mouth 2 (two) times daily with a meal.      . citalopram (CELEXA) 40 MG tablet Take 40 mg by mouth daily.      . fluticasone (FLONASE) 50 MCG/ACT nasal spray Place 1 spray into both nostrils daily as needed for allergies.      Marland Kitchen glucose blood (ACCU-CHEK  SMARTVIEW) test strip 1 each by Other route See admin instructions. Check blood sugar daily as needed for high blood sugar.      . lisinopril-hydrochlorothiazide (PRINZIDE,ZESTORETIC) 20-25 MG per tablet Take 1 tablet by mouth daily.      . metFORMIN (GLUCOPHAGE) 500 MG tablet Take 500 mg by mouth 2 (two) times daily with a meal.      . pravastatin (PRAVACHOL) 40 MG tablet Take 40 mg by mouth daily.      . predniSONE (DELTASONE) 20 MG tablet 2 tabs po daily x 4 days  8 tablet  0  . sulfamethoxazole-trimethoprim (BACTRIM DS,SEPTRA DS) 800-160 MG per tablet Take 1 tablet by mouth 2 (two) times daily.  14 tablet  0  . [DISCONTINUED] albuterol (PROVENTIL,VENTOLIN) 90 MCG/ACT inhaler Inhale 2 puffs into the lungs every 6 (six) hours as needed for wheezing.  17 g  12   No current facility-administered medications for this visit.   Family  History  Problem Relation Age of Onset  . Cancer Mother    History   Social History  . Marital Status: Married    Spouse Name: N/A    Number of Children: N/A  . Years of Education: N/A   Social History Main Topics  . Smoking status: Current Some Day Smoker -- 0.20 packs/day for 40 years    Types: Cigarettes  . Smokeless tobacco: Never Used     Comment: slowly quitting.  . Alcohol Use: 2.4 oz/week    4 Cans of beer per week     Comment: "only on the weekends"  . Drug Use: No  . Sexual Activity: None   Other Topics Concern  . None   Social History Narrative   Lives in [place] with her husband.   Takes care of 3 grand children.   Trying to find a job, has financial difficulties.         Review of Systems: A 12 point ROS was performed; pertinent positives and negatives were noted in the HPI   Objective:  Physical Exam: Filed Vitals:   08/08/14 0843  BP: 140/72  Pulse: 93  Temp: 98.5 F (36.9 C)  TempSrc: Oral  Height: 5' 7" (1.702 m)  Weight: 219 lb 12.8 oz (99.701 kg)  SpO2: 98%   Constitutional: Vital signs reviewed.  Patient is an ill appearing female in no acute distress and cooperative with exam. Alert and oriented x3.  Head: Normocephalic and atraumatic Mouth: No erythema or exudates, MMM Eyes: PERRL, EOMI, conjunctivae normal, No scleral icterus.  Cardiovascular: RRR, no MRG Pulmonary/Chest: Normal respiratory effort, + diffusely scattered wheezes Abdominal: Soft. Non-tender, non-distended Musculoskeletal: No joint deformities, erythema, or stiffness. Moves all 4 extremities. Neurological: A&O x3, cranial nerve II-XII are grossly intact, no focal motor deficits Skin: Warm, dry and intact.  Psychiatric: Appears ill. speech and behavior is normal. Judgment and thought content normal. Cognition and memory are normal.   Assessment & Plan:   Please refer to Problem List based Assessment and Plan

## 2014-08-08 NOTE — Progress Notes (Signed)
Case discussed with Dr. Glenn soon after the resident saw the patient.  We reviewed the resident's history and exam and pertinent patient test results.  I agree with the assessment, diagnosis, and plan of care documented in the resident's note. 

## 2014-08-08 NOTE — Patient Instructions (Addendum)
Take the prednisone, 2 tablets for 5 days starting today. Begin taking the Levaquin 1 tablet once a day for 7 days Use the nebulizer every 4 hours for the next 3 days. Use the nasal saline spray, [place], 2 spray in each nostril 2 times a day, and then blow your nose really well. This will help clean out your nasal passage and sinuses and moisturize as well. After the [place] in the morning, use the Flonase nasal spray as prescribed.   Be sure to take your Symbicort as prescribed (2 times a day)  Stop smoking! This will benefit you the most.   If you are not feeling better by Monday, please call the clinic.   General Instructions:   Please bring your medicines with you each time you come to clinic.  Medicines may include prescription medications, over-the-counter medications, herbal remedies, eye drops, vitamins, or other pills.   Progress Toward Treatment Goals:  Treatment Goal 06/14/2014  Hemoglobin A1C at goal  Blood pressure at goal  Stop smoking smoking less    Self Care Goals & Plans:  Self Care Goal 06/14/2014  Manage my medications take my medicines as prescribed; bring my medications to every visit; refill my medications on time  Monitor my health keep track of my blood glucose; bring my glucose meter and log to each visit  Eat healthy foods drink diet soda or water instead of juice or soda; eat more vegetables; eat foods that are low in salt; eat baked foods instead of fried foods; eat fruit for snacks and desserts  Be physically active -  Stop smoking -    Home Blood Glucose Monitoring 01/03/2013  Check my blood sugar 3 times a day  When to check my blood sugar before dinner; before meals     Care Management & Community Referrals:  Referral 01/03/2013  Referrals made for care management support none needed

## 2014-08-08 NOTE — Assessment & Plan Note (Addendum)
Pt did not complete abx course from ED on 9/19, adn she is not using her Symbicort as prescribed. Pt wheezing with yellow sputum production. Low concern for pneumonia at this time, as she has good air movement. Neb treatment given in the clinic with only mild improvement in symptoms. Starting home abx and steroids.  - Levaquin 500mg  qday x7 days - Prednisone 40mg  po daily x5 days - Albuterol nebulizer treatments q4h x3 days - She is to start using [place] nasal saline spray 2 sprays BID and restart her home Flonase as well

## 2014-08-08 NOTE — Assessment & Plan Note (Signed)
  Assessment: Progress toward smoking cessation:  smoking the same amount Barriers to progress toward smoking cessation:  lack of motivation to quit Comments: pt still smoking despite frequent episodes of COPD exacerbation.   Plan: Instruction/counseling given:  I counseled patient on the dangers of tobacco use, advised patient to stop smoking, and reviewed strategies to maximize success.  Patient agreed to the following self-care plans for smoking cessation: set a quit date and stop smoking

## 2014-08-09 LAB — GLUCOSE, CAPILLARY: GLUCOSE-CAPILLARY: 240 mg/dL — AB (ref 70–99)

## 2014-08-12 ENCOUNTER — Emergency Department (HOSPITAL_COMMUNITY): Payer: Medicare Other

## 2014-08-12 ENCOUNTER — Inpatient Hospital Stay (HOSPITAL_COMMUNITY)
Admission: EM | Admit: 2014-08-12 | Discharge: 2014-08-13 | DRG: 192 | Disposition: A | Discharge status: Home / Self Care | Payer: Medicare Other | Attending: Internal Medicine | Admitting: Internal Medicine

## 2014-08-12 ENCOUNTER — Encounter (HOSPITAL_COMMUNITY): Payer: Self-pay | Admitting: Emergency Medicine

## 2014-08-12 DIAGNOSIS — Z7952 Long term (current) use of systemic steroids: Secondary | ICD-10-CM

## 2014-08-12 DIAGNOSIS — Z9119 Patient's noncompliance with other medical treatment and regimen: Secondary | ICD-10-CM | POA: Diagnosis present

## 2014-08-12 DIAGNOSIS — J439 Emphysema, unspecified: Secondary | ICD-10-CM | POA: Diagnosis not present

## 2014-08-12 DIAGNOSIS — F411 Generalized anxiety disorder: Secondary | ICD-10-CM | POA: Diagnosis present

## 2014-08-12 DIAGNOSIS — E785 Hyperlipidemia, unspecified: Secondary | ICD-10-CM | POA: Diagnosis present

## 2014-08-12 DIAGNOSIS — E119 Type 2 diabetes mellitus without complications: Secondary | ICD-10-CM | POA: Diagnosis not present

## 2014-08-12 DIAGNOSIS — K219 Gastro-esophageal reflux disease without esophagitis: Secondary | ICD-10-CM | POA: Diagnosis present

## 2014-08-12 DIAGNOSIS — Z6833 Body mass index (BMI) 33.0-33.9, adult: Secondary | ICD-10-CM

## 2014-08-12 DIAGNOSIS — I10 Essential (primary) hypertension: Secondary | ICD-10-CM | POA: Diagnosis not present

## 2014-08-12 DIAGNOSIS — F17210 Nicotine dependence, cigarettes, uncomplicated: Secondary | ICD-10-CM | POA: Diagnosis present

## 2014-08-12 DIAGNOSIS — J45909 Unspecified asthma, uncomplicated: Secondary | ICD-10-CM | POA: Diagnosis present

## 2014-08-12 DIAGNOSIS — E669 Obesity, unspecified: Secondary | ICD-10-CM | POA: Diagnosis present

## 2014-08-12 DIAGNOSIS — Z853 Personal history of malignant neoplasm of breast: Secondary | ICD-10-CM | POA: Diagnosis not present

## 2014-08-12 DIAGNOSIS — Z9221 Personal history of antineoplastic chemotherapy: Secondary | ICD-10-CM

## 2014-08-12 DIAGNOSIS — J441 Chronic obstructive pulmonary disease with (acute) exacerbation: Principal | ICD-10-CM | POA: Diagnosis present

## 2014-08-12 DIAGNOSIS — Z720 Tobacco use: Secondary | ICD-10-CM

## 2014-08-12 DIAGNOSIS — F419 Anxiety disorder, unspecified: Secondary | ICD-10-CM | POA: Diagnosis present

## 2014-08-12 DIAGNOSIS — Z923 Personal history of irradiation: Secondary | ICD-10-CM | POA: Diagnosis not present

## 2014-08-12 DIAGNOSIS — E1122 Type 2 diabetes mellitus with diabetic chronic kidney disease: Secondary | ICD-10-CM | POA: Diagnosis present

## 2014-08-12 DIAGNOSIS — F17200 Nicotine dependence, unspecified, uncomplicated: Secondary | ICD-10-CM | POA: Diagnosis present

## 2014-08-12 DIAGNOSIS — R0789 Other chest pain: Secondary | ICD-10-CM | POA: Diagnosis not present

## 2014-08-12 DIAGNOSIS — R0602 Shortness of breath: Secondary | ICD-10-CM | POA: Diagnosis not present

## 2014-08-12 DIAGNOSIS — F329 Major depressive disorder, single episode, unspecified: Secondary | ICD-10-CM | POA: Diagnosis present

## 2014-08-12 LAB — GLUCOSE, CAPILLARY
GLUCOSE-CAPILLARY: 400 mg/dL — AB (ref 70–99)
Glucose-Capillary: 457 mg/dL — ABNORMAL HIGH (ref 70–99)

## 2014-08-12 LAB — CBC
HCT: 36.4 % (ref 36.0–46.0)
Hemoglobin: 12.5 g/dL (ref 12.0–15.0)
MCH: 28.9 pg (ref 26.0–34.0)
MCHC: 34.3 g/dL (ref 30.0–36.0)
MCV: 84.1 fL (ref 78.0–100.0)
PLATELETS: 307 10*3/uL (ref 150–400)
RBC: 4.33 MIL/uL (ref 3.87–5.11)
RDW: 12.8 % (ref 11.5–15.5)
WBC: 9.5 10*3/uL (ref 4.0–10.5)

## 2014-08-12 LAB — I-STAT TROPONIN, ED: TROPONIN I, POC: 0.01 ng/mL (ref 0.00–0.08)

## 2014-08-12 LAB — BASIC METABOLIC PANEL
ANION GAP: 15 (ref 5–15)
BUN: 11 mg/dL (ref 6–23)
CO2: 24 mEq/L (ref 19–32)
Calcium: 9.7 mg/dL (ref 8.4–10.5)
Chloride: 96 mEq/L (ref 96–112)
Creatinine, Ser: 0.77 mg/dL (ref 0.50–1.10)
GFR calc Af Amer: >90 mL/min (ref >90)
GFR, EST NON AFRICAN AMERICAN: 88 mL/min — AB (ref >90)
Glucose, Bld: 226 mg/dL — ABNORMAL HIGH (ref 70–99)
Potassium: 3.7 mEq/L (ref 3.7–5.3)
Sodium: 135 mEq/L — ABNORMAL LOW (ref 137–147)

## 2014-08-12 LAB — TROPONIN I
Troponin I: <0.3 ng/mL (ref <0.30)
Troponin I: <0.3 ng/mL (ref <0.30)

## 2014-08-12 MED ORDER — METHYLPREDNISOLONE SODIUM SUCC 125 MG IJ SOLR
125.0000 mg | Freq: Once | INTRAMUSCULAR | Status: AC
  Administered 2014-08-12: 125 mg via INTRAVENOUS
  Filled 2014-08-12 (×2): qty 2

## 2014-08-12 MED ORDER — CITALOPRAM HYDROBROMIDE 40 MG PO TABS
40.0000 mg | ORAL_TABLET | Freq: Every day | ORAL | Status: DC
  Administered 2014-08-12 – 2014-08-13 (×2): 40 mg via ORAL
  Filled 2014-08-12 (×2): qty 1

## 2014-08-12 MED ORDER — INSULIN ASPART 100 UNIT/ML ~~LOC~~ SOLN
0.0000 [IU] | Freq: Three times a day (TID) | SUBCUTANEOUS | Status: DC
  Administered 2014-08-12: 9 [IU] via SUBCUTANEOUS

## 2014-08-12 MED ORDER — ACETAMINOPHEN 650 MG RE SUPP: 650.0000 mg | Freq: Four times a day (QID) | RECTAL | Status: DC | PRN

## 2014-08-12 MED ORDER — SODIUM CHLORIDE 0.9 % IJ SOLN: 3.0000 mL | INTRAMUSCULAR | Status: DC | PRN

## 2014-08-12 MED ORDER — BUDESONIDE-FORMOTEROL FUMARATE 80-4.5 MCG/ACT IN AERO
2.0000 | INHALATION_SPRAY | Freq: Two times a day (BID) | RESPIRATORY_TRACT | Status: DC
  Administered 2014-08-12 – 2014-08-13 (×3): 2 via RESPIRATORY_TRACT
  Filled 2014-08-12: qty 6.9

## 2014-08-12 MED ORDER — SODIUM CHLORIDE 0.9 % IJ SOLN
3.0000 mL | Freq: Two times a day (BID) | INTRAMUSCULAR | Status: DC
  Administered 2014-08-12 – 2014-08-13 (×2): 3 mL via INTRAVENOUS

## 2014-08-12 MED ORDER — HYDROCHLOROTHIAZIDE 25 MG PO TABS
25.0000 mg | ORAL_TABLET | Freq: Every day | ORAL | Status: DC
  Administered 2014-08-12 – 2014-08-13 (×2): 25 mg via ORAL
  Filled 2014-08-12 (×2): qty 1

## 2014-08-12 MED ORDER — INSULIN ASPART 100 UNIT/ML ~~LOC~~ SOLN
0.0000 [IU] | Freq: Three times a day (TID) | SUBCUTANEOUS | Status: DC
  Administered 2014-08-12: 15 [IU] via SUBCUTANEOUS
  Administered 2014-08-13: 11 [IU] via SUBCUTANEOUS
  Administered 2014-08-13: 8 [IU] via SUBCUTANEOUS
  Administered 2014-08-13: 15 [IU] via SUBCUTANEOUS

## 2014-08-12 MED ORDER — HYDROCODONE-ACETAMINOPHEN 5-325 MG PO TABS
1.0000 | ORAL_TABLET | Freq: Four times a day (QID) | ORAL | Status: DC | PRN
  Administered 2014-08-12 – 2014-08-13 (×3): 2 via ORAL
  Filled 2014-08-12 (×3): qty 2

## 2014-08-12 MED ORDER — INSULIN ASPART 100 UNIT/ML ~~LOC~~ SOLN
0.0000 [IU] | Freq: Every day | SUBCUTANEOUS | Status: DC
  Administered 2014-08-12: 5 [IU] via SUBCUTANEOUS

## 2014-08-12 MED ORDER — CARVEDILOL 6.25 MG PO TABS
6.2500 mg | ORAL_TABLET | Freq: Two times a day (BID) | ORAL | Status: DC
  Administered 2014-08-12 – 2014-08-13 (×4): 6.25 mg via ORAL
  Filled 2014-08-12 (×5): qty 1

## 2014-08-12 MED ORDER — HYDROCOD POLST-CHLORPHEN POLST 10-8 MG/5ML PO LQCR
5.0000 mL | Freq: Two times a day (BID) | ORAL | Status: DC | PRN
  Administered 2014-08-12 – 2014-08-13 (×2): 5 mL via ORAL
  Filled 2014-08-12 (×2): qty 5

## 2014-08-12 MED ORDER — INFLUENZA VAC SPLIT QUAD 0.5 ML IM SUSY
0.5000 mL | PREFILLED_SYRINGE | INTRAMUSCULAR | Status: AC
  Administered 2014-08-13: 0.5 mL via INTRAMUSCULAR
  Filled 2014-08-12: qty 0.5

## 2014-08-12 MED ORDER — SALINE SPRAY 0.65 % NA SOLN
1.0000 | NASAL | Status: DC | PRN
  Administered 2014-08-12: 1 via NASAL
  Filled 2014-08-12: qty 44

## 2014-08-12 MED ORDER — HYDROCODONE-ACETAMINOPHEN 5-325 MG PO TABS
1.0000 | ORAL_TABLET | Freq: Once | ORAL | Status: AC
  Administered 2014-08-12: 1 via ORAL
  Filled 2014-08-12: qty 1

## 2014-08-12 MED ORDER — BENZONATATE 100 MG PO CAPS
100.0000 mg | ORAL_CAPSULE | Freq: Three times a day (TID) | ORAL | Status: DC
  Administered 2014-08-12 – 2014-08-13 (×5): 100 mg via ORAL
  Filled 2014-08-12 (×7): qty 1

## 2014-08-12 MED ORDER — IPRATROPIUM BROMIDE 0.02 % IN SOLN
1.0000 mg | Freq: Once | RESPIRATORY_TRACT | Status: AC
  Administered 2014-08-12: 1 mg via RESPIRATORY_TRACT
  Filled 2014-08-12: qty 5

## 2014-08-12 MED ORDER — PROMETHAZINE HCL 25 MG PO TABS: 12.5000 mg | ORAL_TABLET | Freq: Four times a day (QID) | ORAL | Status: DC | PRN

## 2014-08-12 MED ORDER — ENOXAPARIN SODIUM 40 MG/0.4ML ~~LOC~~ SOLN
40.0000 mg | SUBCUTANEOUS | Status: DC
  Administered 2014-08-12 – 2014-08-13 (×2): 40 mg via SUBCUTANEOUS
  Filled 2014-08-12 (×2): qty 0.4

## 2014-08-12 MED ORDER — SODIUM CHLORIDE 0.9 % IV SOLN: 250.0000 mL | INTRAVENOUS | Status: DC | PRN

## 2014-08-12 MED ORDER — LISINOPRIL 20 MG PO TABS
20.0000 mg | ORAL_TABLET | Freq: Every day | ORAL | Status: DC
  Administered 2014-08-12 – 2014-08-13 (×2): 20 mg via ORAL
  Filled 2014-08-12 (×2): qty 1

## 2014-08-12 MED ORDER — ALBUTEROL (5 MG/ML) CONTINUOUS INHALATION SOLN
10.0000 mg/h | INHALATION_SOLUTION | RESPIRATORY_TRACT | Status: AC
  Administered 2014-08-12: 10 mg/h via RESPIRATORY_TRACT
  Filled 2014-08-12: qty 40

## 2014-08-12 MED ORDER — SODIUM CHLORIDE 0.9 % IJ SOLN
3.0000 mL | Freq: Two times a day (BID) | INTRAMUSCULAR | Status: DC
  Administered 2014-08-12 – 2014-08-13 (×3): 3 mL via INTRAVENOUS

## 2014-08-12 MED ORDER — IPRATROPIUM-ALBUTEROL 0.5-2.5 (3) MG/3ML IN SOLN
3.0000 mL | Freq: Once | RESPIRATORY_TRACT | Status: AC
  Administered 2014-08-12: 3 mL via RESPIRATORY_TRACT
  Filled 2014-08-12: qty 3

## 2014-08-12 MED ORDER — IPRATROPIUM-ALBUTEROL 0.5-2.5 (3) MG/3ML IN SOLN
3.0000 mL | Freq: Four times a day (QID) | RESPIRATORY_TRACT | Status: DC
  Administered 2014-08-12 – 2014-08-13 (×5): 3 mL via RESPIRATORY_TRACT
  Filled 2014-08-12 (×5): qty 3

## 2014-08-12 MED ORDER — FLUTICASONE PROPIONATE 50 MCG/ACT NA SUSP
1.0000 | Freq: Every day | NASAL | Status: DC
  Administered 2014-08-12 – 2014-08-13 (×2): 1 via NASAL
  Filled 2014-08-12: qty 16

## 2014-08-12 MED ORDER — SIMVASTATIN 20 MG PO TABS
20.0000 mg | ORAL_TABLET | Freq: Every day | ORAL | Status: DC
  Administered 2014-08-12 – 2014-08-13 (×2): 20 mg via ORAL
  Filled 2014-08-12 (×3): qty 1

## 2014-08-12 MED ORDER — ALBUTEROL SULFATE (2.5 MG/3ML) 0.083% IN NEBU: 3.0000 mL | INHALATION_SOLUTION | Freq: Four times a day (QID) | RESPIRATORY_TRACT | Status: DC | PRN

## 2014-08-12 MED ORDER — DOXYCYCLINE HYCLATE 100 MG PO TABS
100.0000 mg | ORAL_TABLET | Freq: Two times a day (BID) | ORAL | Status: DC
  Administered 2014-08-12 – 2014-08-13 (×3): 100 mg via ORAL
  Filled 2014-08-12 (×4): qty 1

## 2014-08-12 MED ORDER — LEVOFLOXACIN 500 MG PO TABS
500.0000 mg | ORAL_TABLET | Freq: Every day | ORAL | Status: DC
  Filled 2014-08-12: qty 1

## 2014-08-12 MED ORDER — ACETAMINOPHEN 325 MG PO TABS: 650.0000 mg | ORAL_TABLET | Freq: Four times a day (QID) | ORAL | Status: DC | PRN

## 2014-08-12 MED ORDER — FLUTICASONE PROPIONATE 50 MCG/ACT NA SUSP
1.0000 | Freq: Every day | NASAL | Status: DC | PRN
  Filled 2014-08-12: qty 16

## 2014-08-12 MED ORDER — LISINOPRIL-HYDROCHLOROTHIAZIDE 20-25 MG PO TABS: 1.0000 | ORAL_TABLET | Freq: Every day | ORAL | Status: DC

## 2014-08-12 MED ORDER — MORPHINE SULFATE 2 MG/ML IJ SOLN
1.0000 mg | Freq: Once | INTRAMUSCULAR | Status: AC
  Administered 2014-08-12: 1 mg via INTRAVENOUS
  Filled 2014-08-12: qty 1

## 2014-08-12 NOTE — ED Notes (Signed)
Pt completed continuous neb  

## 2014-08-12 NOTE — ED Provider Notes (Signed)
CSN: [id number]     Arrival date & time 08/12/14  0354 History   First MD Initiated Contact with Patient 08/12/14 [phone]     Chief Complaint  Patient presents with  . Shortness of Breath     (Consider location/radiation/quality/duration/timing/severity/associated sxs/prior Treatment) Patient is a 63 y.o. female presenting with shortness of breath. The history is provided by the patient and medical records. No language interpreter was used.  Shortness of Breath Associated symptoms: cough and wheezing   Associated symptoms: no abdominal pain, no chest pain, no diaphoresis, no fever, no headaches, no rash and no vomiting     [name] is a 63 y.o. female  with a hx of COPD who continues to smoke presents to the Emergency Department complaining of gradual, persistent, progressively worsening cough, wheezing, shortness of breath onset 2 weeks ago worsening acutely last night. Patient was seen in the emergency department on 07/27/2014 for COPD exacerbation and discharged home. She followed up with her primary care physician on 08/08/2014 and was given a prednisone taper, Levaquin, home nebulizers and Symbicort without relief. Patient reports that last night her shortness of breath became so intense she felt the need to come to the emergency department.  Associated symptoms include chest wall pain worse with movement and deep breathing secondary to significant coughing. She also complains of dry mouth secondary to her continuous albuterol usage.  She denies measured fevers, chills, headache, neck pain abdominal pain, nausea, vomiting, diarrhea, weakness, dizziness, syncope.     Past Medical History  Diagnosis Date  . Hyperlipidemia   . Hypertension   . Tobacco abuse   . COPD (chronic obstructive pulmonary disease)     History of multiple hospital admissions for exercabation   . Asthma   . Breast cancer 1991    s/p lumpectomy, chemotherapy and radiation therapy in 1991. Mammogram in 2007  was normal.  . Sigmoid diverticulitis 80/2008  . Anxiety   . Depression   . Obesity   . GERD (gastroesophageal reflux disease)   . Heart murmur 10/05/11    "first time I ever heard I had one was today"  . Pneumonia   . Shortness of breath 10/05/11    "at rest; lying down; w/exertion"  . Diabetes mellitus   . Bronchitis     h/o  . Diarrhea     h/o  . Constipated     h/o   Past Surgical History  Procedure Laterality Date  . Dobutamine stress echo  08/2004    Inferior ischemia, normal LV systolic function, no significant CAD  . Abdominal hysterectomy    . Breast surgery  1991    lumphectomy right breast  . Neck surgery  2012    "Dr. Lynann Bologna  put plate in; did something to my vertebrae"   Family History  Problem Relation Age of Onset  . Cancer Mother    History  Substance Use Topics  . Smoking status: Current Some Day Smoker -- 0.20 packs/day for 40 years    Types: Cigarettes  . Smokeless tobacco: Never Used     Comment: slowly quitting.  . Alcohol Use: 2.4 oz/week    4 Cans of beer per week     Comment: "only on the weekends"   OB History   Grav Para Term Preterm Abortions TAB SAB Ect Mult Living                 Review of Systems  Constitutional: Negative for fever, diaphoresis, appetite change, fatigue  and unexpected weight change.  HENT: Negative for mouth sores.   Eyes: Negative for visual disturbance.  Respiratory: Positive for cough, chest tightness, shortness of breath and wheezing.   Cardiovascular: Negative for chest pain.  Gastrointestinal: Negative for nausea, vomiting, abdominal pain, diarrhea and constipation.  Endocrine: Negative for polydipsia, polyphagia and polyuria.  Genitourinary: Negative for dysuria, urgency, frequency and hematuria.  Musculoskeletal: Negative for back pain and neck stiffness.  Skin: Negative for rash.  Allergic/Immunologic: Negative for immunocompromised state.  Neurological: Negative for syncope, light-headedness and  headaches.  Hematological: Does not bruise/bleed easily.  Psychiatric/Behavioral: Negative for sleep disturbance. The patient is not nervous/anxious.       Allergies  Review of patient's allergies indicates no known allergies.  Home Medications   Prior to Admission medications   Medication Sig Start Date End Date Taking? Authorizing Provider  albuterol (PROVENTIL) (2.5 MG/3ML) 0.083% nebulizer solution Take 3 mLs (2.5 mg total) by nebulization every 6 (six) hours as needed for wheezing or shortness of breath. 08/07/14  Yes Jones Bales, MD  albuterol (VENTOLIN HFA) 108 (90 BASE) MCG/ACT inhaler Inhale 2 puffs into the lungs every 6 (six) hours as needed for wheezing or shortness of breath. 08/07/14  Yes Jones Bales, MD  budesonide-formoterol (SYMBICORT) 80-4.5 MCG/ACT inhaler Inhale 2 puffs into the lungs 2 (two) times daily as needed. For asthma 03/12/14  Yes Kinnie Feil, MD  carvedilol (COREG) 6.25 MG tablet Take 6.25 mg by mouth 2 (two) times daily with a meal.   Yes Historical Provider, MD  citalopram (CELEXA) 40 MG tablet Take 40 mg by mouth daily.   Yes Historical Provider, MD  fluticasone (FLONASE) 50 MCG/ACT nasal spray Place 1 spray into both nostrils daily as needed for allergies.   Yes Historical Provider, MD  levofloxacin (LEVAQUIN) 500 MG tablet Take 1 tablet (500 mg total) by mouth daily. 08/08/14 08/18/14 Yes Otho Bellows, MD  lisinopril-hydrochlorothiazide (PRINZIDE,ZESTORETIC) 20-25 MG per tablet Take 1 tablet by mouth daily.   Yes Historical Provider, MD  metFORMIN (GLUCOPHAGE) 500 MG tablet Take 500 mg by mouth 2 (two) times daily with a meal.   Yes Historical Provider, MD  pravastatin (PRAVACHOL) 40 MG tablet Take 40 mg by mouth daily.   Yes Historical Provider, MD  predniSONE (DELTASONE) 20 MG tablet Take 2 tablets (40 mg total) by mouth daily with breakfast. 08/08/14  Yes Otho Bellows, MD  sodium chloride (OCEAN) 0.65 % SOLN nasal spray Place 1 spray into  both nostrils as needed for congestion. 08/08/14  Yes Otho Bellows, MD  tiotropium (SPIRIVA) 18 MCG inhalation capsule Place 18 mcg into inhaler and inhale daily.   Yes Historical Provider, MD  ACCU-CHEK FASTCLIX LANCETS MISC 1 each by Other route See admin instructions. Check blood sugar daily as needed for high blood sugar.    Historical Provider, MD  Blood Glucose Monitoring Suppl (ACCU-CHEK NANO SMARTVIEW) W/DEVICE KIT 1 each by Other route See admin instructions. Check blood sugar daily as needed for high blood sugar.    Historical Provider, MD  glucose blood (ACCU-CHEK SMARTVIEW) test strip 1 each by Other route See admin instructions. Check blood sugar daily as needed for high blood sugar.    Historical Provider, MD   BP 115/67  Pulse 99  Temp(Src) 98.4 F (36.9 C) (Oral)  Resp 16  SpO2 100% Physical Exam  Nursing note and vitals reviewed. Constitutional: She appears well-developed and well-nourished. No distress.  Awake, alert, nontoxic appearance  HENT:  Head: Normocephalic and atraumatic.  Mouth/Throat: Oropharynx is clear and moist. No oropharyngeal exudate.  Eyes: Conjunctivae are normal. No scleral icterus.  Neck: Normal range of motion. Neck supple.  Cardiovascular: Regular rhythm, normal heart sounds and intact distal pulses.   Tachycardia  Pulmonary/Chest: Effort normal and breath sounds normal. No respiratory distress. She has no wheezes. She exhibits tenderness.  Equal chest expansion Inspiratory and expiratory wheezes with accessory muscle use and increased work of breathing Tenderness palpation of the anterior chest and bilateral ribs  Abdominal: Soft. Bowel sounds are normal. She exhibits no mass. There is no tenderness. There is no rebound and no guarding.  Musculoskeletal: Normal range of motion. She exhibits no edema.  Neurological: She is alert. She exhibits normal muscle tone. Coordination normal.  Speech is clear and goal oriented Moves extremities without  ataxia  Skin: Skin is warm and dry. She is not diaphoretic. No erythema.  Psychiatric: She has a normal mood and affect.    ED Course  Procedures (including critical care time) Labs Review Labs Reviewed  BASIC METABOLIC PANEL - Abnormal; Notable for the following:    Sodium 135 (*)    Glucose, Bld 226 (*)    GFR calc non Af Amer 88 (*)    All other components within normal limits  CBC  TROPONIN I  I-STAT TROPOININ, ED    Imaging Review Dg Chest 2 View (if Patient Has Fever And/or Copd)  08/12/2014   CLINICAL DATA:  Initial evaluation for shortness of breath  EXAM: CHEST  2 VIEW  COMPARISON:  Prior radiograph from 07/27/2014  FINDINGS: The cardiac and mediastinal silhouettes are stable in size and contour, and remain within normal limits.  Emphysematous changes again noted. No airspace consolidation, pleural effusion, or pulmonary edema is identified. There is no pneumothorax.  Surgical clips overlie the right axilla.  No acute osseous abnormality identified. ACDF overlies the cervical spine.  IMPRESSION: Emphysema.  No other acute cardiopulmonary abnormality.   Electronically Signed   By: Jeannine Boga M.D.   On: 08/12/2014 05:27     EKG Interpretation   Date/Time:  Monday August 12 2014 04:16:39 EDT Ventricular Rate:  99 PR Interval:  153 QRS Duration: 77 QT Interval:  372 QTC Calculation: 477 R Axis:   79 Text Interpretation:  Sinus rhythm Biatrial enlargement Probable  anteroseptal infarct, old Baseline wander in lead(s) I II III aVR aVF V2  V3 V4 V5 V6 Confirmed by OTTER  MD, OLGA ([id number]) on 08/12/2014 5:54:31 AM      MDM   Final diagnoses:  COPD exacerbation   [name] presents with SOB, wheezing, cough for several weeks, worsening significantly last night.  Labs reassuring, no leukocytosis.  Elevated glucose without evidence of DKA.  Pt with significant wheezing, accessory muscle use and increased work of breathing.  No hypoxia.    6:13  AM Patient discussed with and care transferred to Dr. Sharol Given.  CAT is running at this time.  Pt is already taking steroids, Levaquin and using nebulizer at home without relief.  No evidence of PNA on exam, but anticipate admission due to COPD exacerbation that has failed outpatient management.  Doubt ACS or sepsis.    Plan: admission if pt is not fully resolved after CAT.    BP 115/67  Pulse 99  Temp(Src) 98.4 F (36.9 C) (Oral)  Resp 16  SpO2 100%   Abigail Butts, PA-C 08/12/14 [phone]

## 2014-08-12 NOTE — ED Notes (Signed)
Patient was told by her PCP that if she did not feel better then to come to ED. Patient presents with SOB and audible expiratory wheezing. Cough present with symptoms. Patient states she has rib pain and a headache. Patient reports she has been taking antibiotics recently. Hx of COPD

## 2014-08-12 NOTE — ED Provider Notes (Signed)
Medical screening examination/treatment/procedure(s) were conducted as a shared visit with non-physician practitioner(s) and myself.  I personally evaluated the patient during the encounter.   EKG Interpretation   Date/Time:  Monday August 12 2014 04:16:39 EDT Ventricular Rate:  99 PR Interval:  153 QRS Duration: 77 QT Interval:  372 QTC Calculation: 477 R Axis:   79 Text Interpretation:  Sinus rhythm Biatrial enlargement Probable  anteroseptal infarct, old Baseline wander in lead(s) I II III aVR aVF V2  V3 V4 V5 V6 Confirmed by Faysal Fenoglio  MD, Lovina Zuver ([id number]) on 08/12/2014 5:54:31 AM     Pt seen in clinic on 10/1 and also in the ED recently.  Continues to smoke.  Wheezing persistent despite treatment at home and neb tx here.  1/2 way through hour long neb tx continues to have wheezing, c/o being tired.  Will d/w [facility] for admission.  Kalman Drape, MD 08/12/14 [phone]

## 2014-08-12 NOTE — Progress Notes (Signed)
UR completed Exzavier Ruderman K. Gean Laursen, RN, BSN, [place], CCM  08/12/2014 10:22 AM

## 2014-08-12 NOTE — H&P (Signed)
Date: 08/12/2014               Patient Name:  [name] MRN: [id number]  DOB: 02-18-1951 Age / Sex: 63 y.o., female   PCP: Jones Bales, MD         Medical Service: Internal Medicine Teaching Service         Attending Physician: Dr. Michel Bickers, MD    First Contact: Dr. Sherrine Maples Pager: [phone]  Second Contact: Dr. Redmond Pulling Pager: [phone]       After Hours (After 5p/  First Contact Pager: [phone]  weekends / holidays): Second Contact Pager: [phone]   Chief Complaint: Shortness of breath  History of Present Illness:  Ms. Tolliver is a 63 year old woman with PMH significant for COPD, tobacco use, HTN, DM2, and depression presenting for evaluation of persistent shortness of breath. She has had shortness of breath for 2 weeks now that acutely worsened the night prior to her presentation. Per her report she had URI 2 weeks ago and has been sick ever since. She has been seen at the ED on 9/19 for COPD exacerbation, was discharged home on antibiotics, was advised to use Symbicort. She was noncompliant with the treatment started at the ED and her symptoms did not improve. She was seen at her PCP's office on 10/01 and was started on Levaquin, prednisone, home nebulizer and Symbicort twice daily with good relief but had acute decompensation last night and returned to the ED. She continues to have scant clear/yellow sputum and has chest wall pain that is worse with cough, deeps breathing and with movement. She continues to smoke cigarettes.   In the ED she was given Duoneb treatment x1 with not much relief and started on continuous nebulizer treatment with mild improvement of her wheezing. The IMTS team was called to admit the patient given her persistent COPD exacerbation despite outpatient treatment.   Current Outpatient Prescriptions   Medication  Sig  Dispense  Refill   .  ACCU-CHEK FASTCLIX LANCETS MISC  1 each by Other route See admin instructions. Check blood sugar daily as needed  for high blood sugar.     .  albuterol (PROVENTIL HFA;VENTOLIN HFA) 108 (90 BASE) MCG/ACT inhaler  Inhale 2 puffs into the lungs every 4 (four) hours as needed for wheezing.  1 Inhaler  11   .  albuterol (PROVENTIL) (2.5 MG/3ML) 0.083% nebulizer solution  Take 3 mLs (2.5 mg total) by nebulization every 6 (six) hours as needed for wheezing or shortness of breath.  1 vial  0   .  albuterol (VENTOLIN HFA) 108 (90 BASE) MCG/ACT inhaler  Inhale 2 puffs into the lungs every 6 (six) hours as needed for wheezing or shortness of breath.  1 Inhaler  3   .  Blood Glucose Monitoring Suppl (ACCU-CHEK NANO SMARTVIEW) W/DEVICE KIT  1 each by Other route See admin instructions. Check blood sugar daily as needed for high blood sugar.     .  budesonide-formoterol (SYMBICORT) 80-4.5 MCG/ACT inhaler  Inhale 2 puffs into the lungs 2 (two) times daily as needed. For asthma  1 Inhaler  12   .  carvedilol (COREG) 6.25 MG tablet  Take 6.25 mg by mouth 2 (two) times daily with a meal.     .  citalopram (CELEXA) 40 MG tablet  Take 40 mg by mouth daily.     .  fluticasone (FLONASE) 50 MCG/ACT nasal spray  Place 1 spray into both nostrils daily as  needed for allergies.     Marland Kitchen  glucose blood (ACCU-CHEK SMARTVIEW) test strip  1 each by Other route See admin instructions. Check blood sugar daily as needed for high blood sugar.     .  lisinopril-hydrochlorothiazide (PRINZIDE,ZESTORETIC) 20-25 MG per tablet  Take 1 tablet by mouth daily.     .  metFORMIN (GLUCOPHAGE) 500 MG tablet  Take 500 mg by mouth 2 (two) times daily with a meal.     .  pravastatin (PRAVACHOL) 40 MG tablet  Take 40 mg by mouth daily.     .  predniSONE (DELTASONE) 20 MG tablet  2 tabs po daily x 4 days  8 tablet  0         .  [DISCONTINUED] albuterol (PROVENTIL,VENTOLIN) 90 MCG/ACT inhaler  Inhale 2 puffs into the lungs every 6 (six) hours as needed for wheezing.  17 g  12       Meds: No current facility-administered medications for this encounter.    Current Outpatient Prescriptions  Medication Sig Dispense Refill  . albuterol (PROVENTIL) (2.5 MG/3ML) 0.083% nebulizer solution Take 3 mLs (2.5 mg total) by nebulization every 6 (six) hours as needed for wheezing or shortness of breath.  1 vial  0  . albuterol (VENTOLIN HFA) 108 (90 BASE) MCG/ACT inhaler Inhale 2 puffs into the lungs every 6 (six) hours as needed for wheezing or shortness of breath.  1 Inhaler  3  . budesonide-formoterol (SYMBICORT) 80-4.5 MCG/ACT inhaler Inhale 2 puffs into the lungs 2 (two) times daily as needed. For asthma  1 Inhaler  12  . carvedilol (COREG) 6.25 MG tablet Take 6.25 mg by mouth 2 (two) times daily with a meal.      . citalopram (CELEXA) 40 MG tablet Take 40 mg by mouth daily.      . fluticasone (FLONASE) 50 MCG/ACT nasal spray Place 1 spray into both nostrils daily as needed for allergies.      Marland Kitchen levofloxacin (LEVAQUIN) 500 MG tablet Take 1 tablet (500 mg total) by mouth daily.  7 tablet  0  . lisinopril-hydrochlorothiazide (PRINZIDE,ZESTORETIC) 20-25 MG per tablet Take 1 tablet by mouth daily.      . metFORMIN (GLUCOPHAGE) 500 MG tablet Take 500 mg by mouth 2 (two) times daily with a meal.      . pravastatin (PRAVACHOL) 40 MG tablet Take 40 mg by mouth daily.      . predniSONE (DELTASONE) 20 MG tablet Take 2 tablets (40 mg total) by mouth daily with breakfast.  10 tablet  0  . sodium chloride (OCEAN) 0.65 % SOLN nasal spray Place 1 spray into both nostrils as needed for congestion.  15 mL  3  . tiotropium (SPIRIVA) 18 MCG inhalation capsule Place 18 mcg into inhaler and inhale daily.      Marland Kitchen ACCU-CHEK FASTCLIX LANCETS MISC 1 each by Other route See admin instructions. Check blood sugar daily as needed for high blood sugar.      . Blood Glucose Monitoring Suppl (ACCU-CHEK NANO SMARTVIEW) W/DEVICE KIT 1 each by Other route See admin instructions. Check blood sugar daily as needed for high blood sugar.      Marland Kitchen glucose blood (ACCU-CHEK SMARTVIEW) test strip 1  each by Other route See admin instructions. Check blood sugar daily as needed for high blood sugar.      . [DISCONTINUED] albuterol (PROVENTIL,VENTOLIN) 90 MCG/ACT inhaler Inhale 2 puffs into the lungs every 6 (six) hours as needed for wheezing.  17 g  12    Allergies: Allergies as of 08/12/2014  . (No Known Allergies)   Past Medical History  Diagnosis Date  . Hyperlipidemia   . Hypertension   . Tobacco abuse   . COPD (chronic obstructive pulmonary disease)     History of multiple hospital admissions for exercabation   . Asthma   . Breast cancer 1991    s/p lumpectomy, chemotherapy and radiation therapy in 1991. Mammogram in 2007 was normal.  . Sigmoid diverticulitis 80/2008  . Anxiety   . Depression   . Obesity   . GERD (gastroesophageal reflux disease)   . Heart murmur 10/05/11    "first time I ever heard I had one was today"  . Pneumonia   . Shortness of breath 10/05/11    "at rest; lying down; w/exertion"  . Diabetes mellitus   . Bronchitis     h/o  . Diarrhea     h/o  . Constipated     h/o   Past Surgical History  Procedure Laterality Date  . Dobutamine stress echo  08/2004    Inferior ischemia, normal LV systolic function, no significant CAD  . Abdominal hysterectomy    . Breast surgery  1991    lumphectomy right breast  . Neck surgery  2012    "Dr. Lynann Bologna  put plate in; did something to my vertebrae"   Family History  Problem Relation Age of Onset  . Cancer Mother    History   Social History  . Marital Status: Married    Spouse Name: N/A    Number of Children: N/A  . Years of Education: N/A   Occupational History  . Not on file.   Social History Main Topics  . Smoking status: Current Some Day Smoker -- 0.20 packs/day for 40 years    Types: Cigarettes  . Smokeless tobacco: Never Used     Comment: slowly quitting.  . Alcohol Use: 2.4 oz/week    4 Cans of beer per week     Comment: "only on the weekends"  . Drug Use: No  . Sexual Activity:  Not on file   Other Topics Concern  . Not on file   Social History Narrative   Lives in [place] with her husband.   Takes care of 3 grand children.   Trying to find a job, has financial difficulties.          Review of Systems: Review of Systems  Constitutional: Negative for fever, chills, weight loss, malaise/fatigue and diaphoresis.  HENT: Positive for congestion. Negative for ear pain and sore throat.   Eyes: Negative for pain and discharge.  Respiratory: Positive for cough, sputum production, shortness of breath and wheezing. Negative for hemoptysis.   Cardiovascular: Positive for chest pain. Negative for leg swelling.  Gastrointestinal: Negative for vomiting and blood in stool.  Genitourinary: Negative for dysuria and frequency.  Musculoskeletal: Negative for myalgias.  Skin: Negative for rash.  Neurological: Negative for dizziness, focal weakness and weakness.  Psychiatric/Behavioral: Positive for depression. Negative for suicidal ideas.    Physical Exam: Blood pressure 137/76, pulse 127, temperature 97.7 F (36.5 C), temperature source Axillary, resp. rate 24, SpO2 96.00%. Physical Exam  Nursing note and vitals reviewed. Constitutional: She is oriented to person, place, and time. She appears well-developed and well-nourished. No distress.  Speaks in full sentences  HENT:  Mouth/Throat: Oropharynx is clear and moist. No oropharyngeal exudate.  Pale and boggy nasal turbinates bilaterally  Eyes: Conjunctivae are normal. No scleral icterus.  Cardiovascular:  Mild tachycardia   Respiratory: Effort normal. No respiratory distress. She has wheezes. She has no rales. She exhibits tenderness.  Faint expiratory wheezes through al lung fields Chest wall tenderness at the anterior mid central chest and bilateral ribs Deep breath causes continuous cough  GI: Soft. Bowel sounds are normal. She exhibits no distension. There is no tenderness. There is no rebound and no guarding.   Musculoskeletal: She exhibits no edema and no tenderness.  Neurological: She is alert and oriented to person, place, and time.  Skin: Skin is warm and dry. No rash noted. She is not diaphoretic. No erythema.  Psychiatric: She has a normal mood and affect.      Lab results: Basic Metabolic Panel:  Recent Labs  08/12/14 0410  NA 135*  K 3.7  CL 96  CO2 24  GLUCOSE 226*  BUN 11  CREATININE 0.77  CALCIUM 9.7   CBC:  Recent Labs  08/12/14 0410  WBC 9.5  HGB 12.5  HCT 36.4  MCV 84.1  PLT 307   Cardiac Enzymes:  Recent Labs  08/12/14 0410  TROPONINI <0.30   Urine Drug Screen: Drugs of Abuse     Component Value Date/Time   LABOPIA NONE DETECTED 12/29/2009 0633   COCAINSCRNUR NONE DETECTED 12/29/2009 0633   LABBENZ NONE DETECTED 12/29/2009 0633   AMPHETMU NONE DETECTED 12/29/2009 0633   THCU NONE DETECTED 12/29/2009 0633   LABBARB  Value: NONE DETECTED        DRUG SCREEN FOR MEDICAL PURPOSES ONLY.  IF CONFIRMATION IS NEEDED FOR ANY PURPOSE, NOTIFY LAB WITHIN 5 DAYS.        LOWEST DETECTABLE LIMITS FOR URINE DRUG SCREEN Drug Class       Cutoff (ng/mL) Amphetamine      1000 Barbiturate      200 Benzodiazepine   419 Tricyclics       622 Opiates          300 Cocaine          300 THC              50 12/29/2009 2979     Imaging results:  Dg Chest 2 View (if Patient Has Fever And/or Copd)  08/12/2014   CLINICAL DATA:  Initial evaluation for shortness of breath  EXAM: CHEST  2 VIEW  COMPARISON:  Prior radiograph from 07/27/2014  FINDINGS: The cardiac and mediastinal silhouettes are stable in size and contour, and remain within normal limits.  Emphysematous changes again noted. No airspace consolidation, pleural effusion, or pulmonary edema is identified. There is no pneumothorax.  Surgical clips overlie the right axilla.  No acute osseous abnormality identified. ACDF overlies the cervical spine.  IMPRESSION: Emphysema.  No other acute cardiopulmonary abnormality.   Electronically  Signed   By: Jeannine Boga M.D.   On: 08/12/2014 05:27    Other results: Date/Time: August 12 2014 04:16:39 EDT  Ventricular Rate: 99  PR Interval: 153  QRS Duration: 77  QT Interval: 372  QTC Calculation: 477  Axis: Incomplete right axis deviation Text Interpretation: Sinus rhythm, Biatrial enlargement, old  anteroseptal infarct, Baseline wander in lead(s) I II III aVR aVF V2    Assessment & Plan by Problem: 63 year old woman with PMH of COPD with ongoing tobacco use, presenting with COPD exacerbation in setting of recent URI, failed outpatient management of Levaquin, prednisone, and nebulizer x4h but compliance with treatment is questionable.   COPD exacerbation: Likely precipitated by URI which has  improved but with persistent nasal congestion--may have allergic rhinitis. She is a current every day smoker as well making her recovery difficult. She has been noncompliant with Bactrim BID which was initially prescribed to her on 9/19 by the ED and had been using Symbicort once daily v BID as prescribed. She was started on Levaquin and prednisone on 10/01 and assures compliance with these but admits to not being as compliant with albuterol nebulizer treatments q4hr x3 days. She has persistent wheezing through all lung fields but with improved respiratory status, not using accessory muscle, with 100% oxygen on RA. She demonstrates signs of reactive airway disease with continuous coughing triggered by deep inspiration. Pneumonia is unlikely given no leucocytosis, no fever/chills, no increase in sputum production, no CXR findings c/w pneumonia. PE less likely with no recent immobilization, no calf swelling/tenderness, and indolent development of her SOB. Her Well's score of 1.5 and Geneva score of 5 (moderate risk group) but her tachycardia is due to recent albuterol use at home and in the ED.  -Admit to telemetry  -Duonebs q6hr---> transition to albuterol inhaler q6h once wheezing  improved -Tessalon Perles for cough -Continuous pulse oxymetry -albuterol inhaler q6h PRN for wheezing -solumedrol 164m once, may need additional dose tomorrow depending on symptoms -Doxycycline 1028mBID  -Symbicort 2 puffs BID -Hold home Spiriva while on Duonebs -Saline nasal spray 4 times daily -Flonase daily -incentive spirometry -respiratory therapist consult -Oxygen supplementation PRN -smoking cessation counseling  Chest tightness - Likely noncardiac pain due to cough and pleuritic chest pain. Has chest wall tenderness.  Did not improve with Norco 5-32525m tablet in ED. Troponin x1 negative, EKG with no new changes. TIMI score of 1 but she is a smoker with HLP, DM2.  -Increase Norco to 2 tablets q6hr PRN for pain (avoid NSAIDs with concurrent COPD exacerbation) -Morphine 1mg41m once, may also help with SOB/air hunger -Cough suppressant with tessalon perles -Cycle troponin, EKG in am  HTN - Well controlled. She is on Coreg 6.25mg82m, lisinopril-HCTZ 20-25mg 70my which will be continued.  -Reassess BB use if bronchospasm suspected -Reassess ACEi use if cough persists despite tx of URI and COPD exacernation  DM2- Well controlled. Last Hgb A1C of 6.8% on 8/7. She is on metformin 500mg B63mc which will be held during this hospitalization.  -SSI -carb mod diet  Depression - Stable. Will continue her home celexa 40mg da54m   Hyperlipidemia - Last lipid panel on 8/715 with LDL <100, within goal for this diabetic patient. Continue home pravastatin 40mg dai62m  Hx of prolonged QTc- Stable with QTc<500. Her Qtc interval has been prolonged to 472 in the past, 470 recently on EKG of 9/19, and 477 on EKG today with concurrent use of Celexa and Levaquin.  -Repeat EKG in am  Tobacco use - She has a 10 pack-year smoking hx (1/4 pack for 40 years) . Currently smokes 7-8 cigarettes per day. Does not want nicotine patch.  -Tobacco cessation counseling  VTE prophylaxis: Lovenox [place]  daily  FEN: NSL BMET in am Carb mod diet   Dispo: Disposition is deferred at this time, awaiting improvement of current medical problems. Anticipated discharge in approximately 1-2 day(s).   The patient does have a current PCP (JacquelynJones Bales [facility].  The patient does not have transportation limitations that hinder transportation to clinic appointments.  Signed: Solianny Blain Pais3, IMTS 08/12/2014, 7:33 AM   I have  seen and examined Ms. Beets today. I agree that she is probably had a recent upper respiratory infection that served as a trigger for exacerbation of her underlying COPD and possible asthma. Obviously ongoing cigarette use is also a factor. She states that she is interested in quitting smoking. Her husband is a smoker. He has received his starter pack of Chantix. She states that she has been prescribed it 3 times but it has never arrived from her pharmacy. They were interested in trying to quit together. I'm also concerned about her having a relatively poor understanding of what medications she is supposed to take and how to take them. She tells me that she does not have Symbicort but is taking Spiriva. We will need to check with [facility] and do complete medication reconciliation while she is here. Currently she is feeling better. Her oxygen saturation on room air is 97%. She has no increase work of breathing. She has faint, scattered wheezes bilaterally.  Michel Bickers, MD [facility] for [facility] [place] [facility] [phone] pager   [phone] cell 08/12/2014, 4:19 PM

## 2014-08-12 NOTE — Care Management Note (Addendum)
  Page 1 of 1   08/12/2014     10:29:12 AM CARE MANAGEMENT NOTE 08/12/2014  Patient:  [facility]   Account Number:  [id number]  Date Initiated:  08/12/2014  Documentation initiated by:  Benjamen Koelling  Subjective/Objective Assessment:   SOB, URI, COPD     Action/Plan:   CM to follow for disposition plan   Anticipated DC Date:  08/15/2014   Anticipated DC Plan:  HOME/SELF CARE         Choice offered to / List presented to:             Status of service:  Completed, signed off Medicare Important Message given?   (If response is "NO", the following Medicare IM given date fields will be blank) Date Medicare IM given:   Medicare IM given by:   Date Additional Medicare IM given:   Additional Medicare IM given by:    Discharge Disposition:  HOME/SELF CARE  Per UR Regulation:  Reviewed for med. necessity/level of care/duration of stay  If discussed at Long Length of Stay Meetings, dates discussed:    Comments:  Airyana Sprunger RN, BSN, MSHL, CCM  Nurse - Case Manager,  ([facility])  [phone]  08/12/2014 SOB, chest wall pain that is worse with cough, Hx/o COPD, HTN, DM2, and depression.  Smoker Hx/o ED visit on 9/19 for COPD exacerbation, was discharged home on antibiotics, was advised to use Symbicort. Hx/o PCP's appt on 10/01 and was started on Levaquin, prednisone, home nebulizer and Symbicort twice daily with good relief but had acute decompensation last night and returned to the ED. IM:  10/5  provided by admissions. Social:  From home with wife Disposition Plan:  [place] CM will continue to monitor.

## 2014-08-13 ENCOUNTER — Other Ambulatory Visit: Payer: Self-pay

## 2014-08-13 DIAGNOSIS — F419 Anxiety disorder, unspecified: Secondary | ICD-10-CM

## 2014-08-13 DIAGNOSIS — J441 Chronic obstructive pulmonary disease with (acute) exacerbation: Secondary | ICD-10-CM | POA: Diagnosis not present

## 2014-08-13 LAB — GLUCOSE, CAPILLARY
GLUCOSE-CAPILLARY: 319 mg/dL — AB (ref 70–99)
Glucose-Capillary: 354 mg/dL — ABNORMAL HIGH (ref 70–99)
Glucose-Capillary: 352 mg/dL — ABNORMAL HIGH (ref 70–99)
Glucose-Capillary: 275 mg/dL — ABNORMAL HIGH (ref 70–99)

## 2014-08-13 LAB — CBC
HCT: 33.9 % — ABNORMAL LOW (ref 36.0–46.0)
Hemoglobin: 11.7 g/dL — ABNORMAL LOW (ref 12.0–15.0)
MCH: 29.1 pg (ref 26.0–34.0)
MCHC: 34.5 g/dL (ref 30.0–36.0)
MCV: 84.3 fL (ref 78.0–100.0)
Platelets: 291 10*3/uL (ref 150–400)
RBC: 4.02 MIL/uL (ref 3.87–5.11)
RDW: 13 % (ref 11.5–15.5)
WBC: 8.6 10*3/uL (ref 4.0–10.5)

## 2014-08-13 LAB — BASIC METABOLIC PANEL
ANION GAP: 18 — AB (ref 5–15)
BUN: 15 mg/dL (ref 6–23)
CO2: 23 mEq/L (ref 19–32)
CREATININE: 0.67 mg/dL (ref 0.50–1.10)
Calcium: 9.1 mg/dL (ref 8.4–10.5)
Chloride: 93 mEq/L — ABNORMAL LOW (ref 96–112)
GFR calc non Af Amer: >90 mL/min (ref >90)
Glucose, Bld: 396 mg/dL — ABNORMAL HIGH (ref 70–99)
Potassium: 4.1 mEq/L (ref 3.7–5.3)
Sodium: 134 mEq/L — ABNORMAL LOW (ref 137–147)

## 2014-08-13 MED ORDER — DEXTROMETHORPHAN-GUAIFENESIN 10-100 MG/5ML PO SYRP: 5.0000 mL | ORAL_SOLUTION | Freq: Two times a day (BID) | ORAL | Status: DC

## 2014-08-13 MED ORDER — RAMELTEON 8 MG PO TABS
8.0000 mg | ORAL_TABLET | Freq: Every day | ORAL | Status: DC
  Administered 2014-08-13: 8 mg via ORAL
  Filled 2014-08-13 (×2): qty 1

## 2014-08-13 MED ORDER — SULFAMETHOXAZOLE-TMP DS 800-160 MG PO TABS: 1.0000 | ORAL_TABLET | Freq: Two times a day (BID) | ORAL | Status: DC

## 2014-08-13 MED ORDER — BENZONATATE 100 MG PO CAPS: 100.0000 mg | ORAL_CAPSULE | Freq: Three times a day (TID) | ORAL | Status: DC

## 2014-08-13 MED ORDER — BUDESONIDE-FORMOTEROL FUMARATE 80-4.5 MCG/ACT IN AERO: 2.0000 | INHALATION_SPRAY | Freq: Two times a day (BID) | RESPIRATORY_TRACT | Status: DC | PRN

## 2014-08-13 MED ORDER — FLUTICASONE PROPIONATE 50 MCG/ACT NA SUSP: 1.0000 | Freq: Every day | NASAL | Status: DC | PRN

## 2014-08-13 MED ORDER — RAMELTEON 8 MG PO TABS
8.0000 mg | ORAL_TABLET | Freq: Every day | ORAL | Status: DC
  Filled 2014-08-13: qty 1

## 2014-08-13 MED ORDER — DOXYCYCLINE HYCLATE 100 MG PO TABS
100.0000 mg | ORAL_TABLET | Freq: Two times a day (BID) | ORAL | Status: DC
Start: 2014-08-13 — End: 2014-08-13

## 2014-08-13 NOTE — Progress Notes (Signed)
Patient evaluated for community based chronic disease management services with [place] [facility] as a benefit of patient's [employer]. Spoke with patient at bedside to explain [place] [facility] services.  Services have been accepted with written consent.  She has requested assistance with paying for her inhalers.  We will assess her eligibility for [place] for long term medication assistance.  Will request an overview of HCPOA process for patient at bedside by chaplain services.  She indicated that she got sick this admission from a sick contact at home.  Patient does smoke but feels that this does not contribute as much as her sick contact.  Patient will receive a post discharge transition of care call and will be evaluated for monthly home visits for assessments and COPD disease process education.  Left contact information and [facility] literature at bedside. Made Inpatient Case Manager aware that [place] [facility] following. Of note, [facility] Care Management services does not replace or interfere with any services that are arranged by inpatient case management or social work.  For additional questions or referrals please contact Corliss Blacker BSN RN [facility] Liaison at [phone].

## 2014-08-13 NOTE — Progress Notes (Signed)
Patient ID: [name], female   DOB: [birth year], 63 y.o.   MRN: [id number]        Attending progress note    Date of Admission:  08/12/2014     Principal Problem:   COPD exacerbation Active Problems:   DM2 (diabetes mellitus, type 2)   TOBACCO ABUSE   Depression   Hypertension, benign essential, goal below 140/90   . benzonatate  100 mg Oral TID  . budesonide-formoterol  2 puff Inhalation BID  . carvedilol  6.25 mg Oral BID WC  . citalopram  40 mg Oral Daily  . doxycycline  100 mg Oral Q12H  . enoxaparin (LOVENOX) injection  40 mg Subcutaneous Q24H  . fluticasone  1 spray Each Nare Daily  . lisinopril  20 mg Oral Daily   And  . hydrochlorothiazide  25 mg Oral Daily  . insulin aspart  0-15 Units Subcutaneous TID WC  . insulin aspart  0-5 Units Subcutaneous QHS  . ipratropium-albuterol  3 mL Nebulization Q6H  . ramelteon  8 mg Oral QHS  . simvastatin  20 mg Oral q1800  . sodium chloride  3 mL Intravenous Q12H  . sodium chloride  3 mL Intravenous Q12H    I have seen and examined [name] with our team today. She is doing much better with minimal wheezes, no increase work of breathing and normal room air oxygen saturation. It is clear that she is having difficulty obtaining and taking her COPD medications correctly. It appears that she has been off of Symbicort and back on Spiriva for the last 2 weeks. We worked on medication reconciliation and education with her today. She also has some component of anxiety. I do not see that she's ever had PFTs obtained which should be ordered at the time of her hospital followup. She should be ready for discharge this afternoon.  Michel Bickers, MD [facility] for [facility]ious [place] [facility] [phone] pager   [phone] cell 08/13/2014, 4:36 PM

## 2014-08-13 NOTE — Progress Notes (Signed)
D/C'd IV.  D/C'd tele.  D/C'd pt.  Explained discharge instructions to pt.  Explained all prescribed medications and when the next dose is to be taken.  Pt is in no acute distress.  Pt has no further questions.

## 2014-08-13 NOTE — Progress Notes (Signed)
Subjective: [name] feels well this morning. She is worried about getting up and walking; she has episodes (as recently as this morning) of feeling as though she cannot catch her breath. She would like to test this out today.  Event: 96% saturation while seated; ambulation with O2 monitor revealed 99% saturation  Objective: Vital signs in last 24 hours: Filed Vitals:   08/13/14 1014 08/13/14 1352 08/13/14 1401 08/13/14 1545  BP: 124/64 131/66  114/49  Pulse: 81 91  80  Temp:  98.6 F (37 C)    TempSrc:  Oral    Resp:  18    Height:      Weight:      SpO2:  97% 95%    Weight change:   Intake/Output Summary (Last 24 hours) at 08/13/14 1551 Last data filed at 08/13/14 1351  Gross per 24 hour  Intake   1540 ml  Output   3000 ml  Net  -1460 ml   Physical Exam: Appearance: in NAD, talking to her husband on the phone HEENT: MMM, AT/[place], no oropharyngeal exudate, boggy nasal turbinates Heart: RRR, normal S1S2, no murmurs Lungs: CTAB, no wheezes, no increased work of breathing, improved from yesterday's exam, tenderness to palpation inferior thorax R>L  Abdomen: BS+, soft, nontender Extremities: no LE edema b/l Neurologic: A&Ox3 Skin: warm, dry, no rashes, no diaphoresis  Lab Results: Basic Metabolic Panel:  Recent Labs Lab 08/12/14 0410 08/13/14 0340  NA 135* 134*  K 3.7 4.1  CL 96 93*  CO2 24 23  GLUCOSE 226* 396*  BUN 11 15  CREATININE 0.77 0.67  CALCIUM 9.7 9.1   Liver Function Tests: No results found for this basename: AST, ALT, ALKPHOS, BILITOT, PROT, ALBUMIN,  in the last 168 hours No results found for this basename: LIPASE, AMYLASE,  in the last 168 hours No results found for this basename: AMMONIA,  in the last 168 hours CBC:  Recent Labs Lab 08/12/14 0410 08/13/14 0340  WBC 9.5 8.6  HGB 12.5 11.7*  HCT 36.4 33.9*  MCV 84.1 84.3  PLT 307 291   Cardiac Enzymes:  Recent Labs Lab 08/12/14 0410 08/12/14 0913  TROPONINI <0.30 <0.30    BNP: No results found for this basename: PROBNP,  in the last 168 hours D-Dimer: No results found for this basename: DDIMER,  in the last 168 hours CBG:  Recent Labs Lab 08/12/14 1608 08/12/14 1629 08/12/14 2100 08/13/14 0232 08/13/14 0543 08/13/14 1123  GLUCAP >600* >600* 457* 354* 352* 275*    Urine Drug Screen: Drugs of Abuse     Component Value Date/Time   LABOPIA NONE DETECTED 12/29/2009 0633   COCAINSCRNUR NONE DETECTED 12/29/2009 0633   LABBENZ NONE DETECTED 12/29/2009 0633   AMPHETMU NONE DETECTED 12/29/2009 0633   THCU NONE DETECTED 12/29/2009 0633   LABBARB  Value: NONE DETECTED        DRUG SCREEN FOR MEDICAL PURPOSES ONLY.  IF CONFIRMATION IS NEEDED FOR ANY PURPOSE, NOTIFY LAB WITHIN 5 DAYS.        LOWEST DETECTABLE LIMITS FOR URINE DRUG SCREEN Drug Class       Cutoff (ng/mL) Amphetamine      1000 Barbiturate      200 Benzodiazepine   626 Tricyclics       948 Opiates          300 Cocaine          300 THC  50 12/29/2009 7672    Studies/Results: Dg Chest 2 View (if Patient Has Fever And/or Copd)  08/12/2014   CLINICAL DATA:  Initial evaluation for shortness of breath  EXAM: CHEST  2 VIEW  COMPARISON:  Prior radiograph from 07/27/2014  FINDINGS: The cardiac and mediastinal silhouettes are stable in size and contour, and remain within normal limits.  Emphysematous changes again noted. No airspace consolidation, pleural effusion, or pulmonary edema is identified. There is no pneumothorax.  Surgical clips overlie the right axilla.  No acute osseous abnormality identified. ACDF overlies the cervical spine.  IMPRESSION: Emphysema.  No other acute cardiopulmonary abnormality.   Electronically Signed   By: Jeannine Boga M.D.   On: 08/12/2014 05:27   Medications: I have reviewed the patient's current medications. Scheduled Meds: . benzonatate  100 mg Oral TID  . budesonide-formoterol  2 puff Inhalation BID  . carvedilol  6.25 mg Oral BID WC  . citalopram  40 mg  Oral Daily  . doxycycline  100 mg Oral Q12H  . enoxaparin (LOVENOX) injection  40 mg Subcutaneous Q24H  . fluticasone  1 spray Each Nare Daily  . lisinopril  20 mg Oral Daily   And  . hydrochlorothiazide  25 mg Oral Daily  . insulin aspart  0-15 Units Subcutaneous TID WC  . insulin aspart  0-5 Units Subcutaneous QHS  . ipratropium-albuterol  3 mL Nebulization Q6H  . ramelteon  8 mg Oral QHS  . simvastatin  20 mg Oral q1800  . sodium chloride  3 mL Intravenous Q12H  . sodium chloride  3 mL Intravenous Q12H   Continuous Infusions:  PRN Meds:.sodium chloride, acetaminophen, acetaminophen, albuterol, chlorpheniramine-HYDROcodone, HYDROcodone-acetaminophen, promethazine, sodium chloride, sodium chloride Assessment/Plan: Principal Problem:   COPD exacerbation Active Problems:   DM2 (diabetes mellitus, type 2)   TOBACCO ABUSE   Depression   Hypertension, benign essential, goal below 140/90 [name] is a 63 yo woman with COPD who was admitted for care of her URI and subsequent COPD exacerbation. She failed outpatient management on levaquin, prednisone and nebulizer treatments, though her compliance is questionable. As noted in Dr. Ferd Glassing most recent clinic notes, the patient has never had PFTs. Her September, 2015 chest xray was significant for emphysematous changes, but outpatient PFTs are indicated. Her ongoing cigarette use also contributes to her disease. She was interested in trying Chantix along with her husband, but she is worried about the cost.  COPD Exacerbation: Likely precipitated by URI. Also, some patient misunderstanding about which inhalers to use may be contributing to her exacerbation. She states that she has not been using her Symbicort due to cost. She has been using Ventolin, Spiriva and duonebs. Patient has not been using supplemental O2 in the hospital. Some chest pain on coughing (lower thorax tenderness); troponins have been negative.  - Duonebs q6 hours - Tessalon  Perles for cough - Albuterol inhaler q6 hours PRN for wheezing - Solumedrol 125 mg was given once - Doxycycline 100 mg BID - Symbicort 2 puffs BID and work with pharmacy on cost (consider advair or dulera) - Hold home Spiriva while on duonebs - Saline nasal spray 4 times / day - Flonase daily  - Incentive spirometry - Smoking cessation counseling  HTN: well-controlled on coreg, lisinopril-HCTZ daily  DMII: well-controlled with last HgA1c 6.8% on 06/14/2014. On metformin 500 BID at home. Needed SSI last night after solumedrol dose.   Depression and Anxiety: multiple stressors at home including daughter's recent broken kneecap. Continuing home Celexa 40.  Tobacco Use: 10 pack year history with current ongoing use. Is interested in trying Chantix with her husband, but is concerned that it will be expensive. - Cessation counseling initiated - Will place call to pharmacy to look into cost  DVT ppx: [place] lovenox  Dispo: Disposition is deferred at this time, awaiting improvement of current medical problems.  Anticipated discharge in approximately 0 day(s).   The patient does have a current PCP (Jones Bales, MD) and does need an [facility] hospital follow-up appointment after discharge.  The patient does not have transportation limitations that hinder transportation to clinic appointments.  .Services Needed at time of discharge: Y = Yes, Blank = No PT:   OT:   RN:   Equipment:   Other:     LOS: 1 day   Drucilla Schmidt, MD 08/13/2014, 3:51 PM

## 2014-08-13 NOTE — Progress Notes (Signed)
Inpatient Diabetes Program Recommendations  AACE/ADA: New Consensus Statement on Inpatient Glycemic Control (2013)  Target Ranges:  Prepandial:   less than 140 mg/dL      Peak postprandial:   less than 180 mg/dL (1-2 hours)      Critically ill patients:  140 - 180 mg/dL   Results for CONLEIGH, HEINLEIN (MRN [id number]) as of 08/13/2014 10:52  Ref. Range 08/12/2014 16:08 08/12/2014 16:29 08/12/2014 21:00 08/13/2014 02:32 08/13/2014 05:43  Glucose-Capillary Latest Range: 70-99 mg/dL >600 (HH) >600 (HH) 457 (H) 354 (H) 352 (H)   Reason for assessment: elevated CBG  Diabetes history: Type 2 Outpatient Diabetes medications: Glucophage 500mg  bid Current orders for Inpatient glycemic control: Novolog 0-5 units qhs, Novolog correction 0-15 units ac meals    May want to consider adding 10 units Lantus insulin and Novolog 5 units with meals along with Novolog correction.   Gentry Fitz, RN, BA, MHA, CDE Diabetes Coordinator Inpatient Diabetes Program  [phone] (Team Pager) [phone] [facility]) 08/13/2014 10:58 AM

## 2014-08-13 NOTE — Discharge Instructions (Signed)

## 2014-08-13 NOTE — Progress Notes (Signed)
Gave pt information r/t Advance Directives.

## 2014-08-13 NOTE — Discharge Summary (Signed)
Name: [name] MRN: [id number] DOB: [birth year] 63 y.o. PCP: Jones Bales, MD  Date of Admission: 08/12/2014  3:58 AM Date of Discharge: 08/13/2014 Attending Physician: Michel Bickers, MD  Discharge Diagnosis: Principal Problem:   COPD exacerbation Active Problems:   DM2 (diabetes mellitus, type 2)   TOBACCO ABUSE   Depression   Hypertension, benign essential, goal below 140/90  Discharge Medications:   Medication List    ASK your doctor about these medications       ACCU-CHEK FASTCLIX LANCETS Misc  1 each by Other route See admin instructions. Check blood sugar daily as needed for high blood sugar.     ACCU-CHEK NANO SMARTVIEW W/DEVICE Kit  1 each by Other route See admin instructions. Check blood sugar daily as needed for high blood sugar.     ACCU-CHEK SMARTVIEW test strip  Generic drug:  glucose blood  1 each by Other route See admin instructions. Check blood sugar daily as needed for high blood sugar.     albuterol (2.5 MG/3ML) 0.083% nebulizer solution  Commonly known as:  PROVENTIL  Take 3 mLs (2.5 mg total) by nebulization every 6 (six) hours as needed for wheezing or shortness of breath.     albuterol 108 (90 BASE) MCG/ACT inhaler  Commonly known as:  VENTOLIN HFA  Inhale 2 puffs into the lungs every 6 (six) hours as needed for wheezing or shortness of breath.     budesonide-formoterol 80-4.5 MCG/ACT inhaler  Commonly known as:  SYMBICORT  Inhale 2 puffs into the lungs 2 (two) times daily as needed. For asthma     carvedilol 6.25 MG tablet  Commonly known as:  COREG  Take 6.25 mg by mouth 2 (two) times daily with a meal.     citalopram 40 MG tablet  Commonly known as:  CELEXA  Take 40 mg by mouth daily.     fluticasone 50 MCG/ACT nasal spray  Commonly known as:  FLONASE  Place 1 spray into both nostrils daily as needed for allergies.     levofloxacin 500 MG tablet  Commonly known as:  LEVAQUIN  Take 1 tablet (500 mg total) by mouth  daily.     lisinopril-hydrochlorothiazide 20-25 MG per tablet  Commonly known as:  PRINZIDE,ZESTORETIC  Take 1 tablet by mouth daily.     metFORMIN 500 MG tablet  Commonly known as:  GLUCOPHAGE  Take 500 mg by mouth 2 (two) times daily with a meal.     pravastatin 40 MG tablet  Commonly known as:  PRAVACHOL  Take 40 mg by mouth daily.     predniSONE 20 MG tablet  Commonly known as:  DELTASONE  Take 2 tablets (40 mg total) by mouth daily with breakfast.     sodium chloride 0.65 % Soln nasal spray  Commonly known as:  OCEAN  Place 1 spray into both nostrils as needed for congestion.     tiotropium 18 MCG inhalation capsule  Commonly known as:  SPIRIVA  Place 18 mcg into inhaler and inhale daily.        Disposition and follow-up:   [name] was discharged from [facility] in Stable condition.  At the hospital follow up visit please address:  1.  [name] was admitted for COPD exacerbation. She had debatable outpatient compliance with her outpatient levaquin, prednisone and nebulizer treatments. A main focus of the hospitalization was straightening out her home medications, as she admitted to some confusion over which inhalers  to take (she had been using Ventolin and Spiriva daily; she had not been using her Symbicort). As stated in previous notes, [name] should be sent for PFT.  2.  Labs / imaging needed at time of follow-up: PFTs  3.  Pending labs/ test needing follow-up: none  Follow-up Appointments: Otho Bellows October 7th, 2:15 PM [place] [address] [phone]  Discharge Instructions: As discussed with Dr. Hayes Ludwig, please use the following inhalers: -- Ventolin 2 puffs every 6 hours as needed for wheezing or shortness of breath -- Symbicort 2 puffs two times per day as needed for asthma -- Spiriva every day (18 mcg placed in inhaler)   Chronic Obstructive Pulmonary Disease  Chronic obstructive pulmonary  disease (COPD) is a common lung condition in which airflow from the lungs is limited. COPD is a general term that can be used to describe many different lung problems that limit airflow, including both chronic bronchitis and emphysema. If you have COPD, your lung function will probably never return to normal, but there are measures you can take to improve lung function and make yourself feel better.  CAUSES  Smoking (common).  Exposure to secondhand smoke.  Genetic problems.  Chronic inflammatory lung diseases or recurrent infections. SYMPTOMS  Shortness of breath, especially with physical activity.  Deep, persistent (chronic) cough with a large amount of thick mucus.  Wheezing.  Rapid breaths (tachypnea).  Gray or bluish discoloration (cyanosis) of the skin, especially in fingers, toes, or lips.  Fatigue.  Weight loss.  Frequent infections or episodes when breathing symptoms become much worse (exacerbations).  Chest tightness. DIAGNOSIS  Your health care provider will take a medical history and perform a physical examination to make the initial diagnosis. Additional tests for COPD may include:  Lung (pulmonary) function tests.  Chest X-ray.  CT scan.  Blood tests. TREATMENT  Treatment available to help you feel better when you have COPD includes:  Inhaler and nebulizer medicines. These help manage the symptoms of COPD and make your breathing more comfortable.  Supplemental oxygen. Supplemental oxygen is only helpful if you have a low oxygen level in your blood.  Exercise and physical activity. These are beneficial for nearly all people with COPD. Some people may also benefit from a pulmonary rehabilitation program. HOME CARE INSTRUCTIONS  Take all medicines (inhaled or pills) as directed by your health care provider.  Avoid over-the-counter medicines or cough syrups that dry up your airway (such as antihistamines) and slow down the elimination of secretions unless instructed otherwise  by your health care provider.  If you are a smoker, the most important thing that you can do is stop smoking. Continuing to smoke will cause further lung damage and breathing trouble. Ask your health care provider for help with quitting smoking. He or she can direct you to community resources or hospitals that provide support.  Avoid exposure to irritants such as smoke, chemicals, and fumes that aggravate your breathing.  Use oxygen therapy and pulmonary rehabilitation if directed by your health care provider. If you require home oxygen therapy, ask your health care provider whether you should purchase a pulse oximeter to measure your oxygen level at home.  Avoid contact with individuals who have a contagious illness.  Avoid extreme temperature and humidity changes.  Eat healthy foods. Eating smaller, more frequent meals and resting before meals may help you maintain your strength.  Stay active, but balance activity with periods of rest. Exercise and physical activity will help  you maintain your ability to do things you want to do.  Preventing infection and hospitalization is very important when you have COPD. Make sure to receive all the vaccines your health care provider recommends, especially the pneumococcal and influenza vaccines. Ask your health care provider whether you need a pneumonia vaccine.  Learn and use relaxation techniques to manage stress.  Learn and use controlled breathing techniques as directed by your health care provider. Controlled breathing techniques include:  Pursed lip breathing. Start by breathing in (inhaling) through your nose for 1 second. Then, purse your lips as if you were going to whistle and breathe out (exhale) through the pursed lips for 2 seconds.  Diaphragmatic breathing. Start by putting one hand on your abdomen just above your waist. Inhale slowly through your nose. The hand on your abdomen should move out. Then purse your lips and exhale slowly. You should be  able to feel the hand on your abdomen moving in as you exhale.  Learn and use controlled coughing to clear mucus from your lungs. Controlled coughing is a series of short, progressive coughs. The steps of controlled coughing are:  1. Lean your head slightly forward.  2. Breathe in deeply using diaphragmatic breathing.  3. Try to hold your breath for 3 seconds.  4. Keep your mouth slightly open while coughing twice.  5. Spit any mucus out into a tissue.  6. Rest and repeat the steps once or twice as needed. SEEK MEDICAL CARE IF:  You are coughing up more mucus than usual.  There is a change in the color or thickness of your mucus.  Your breathing is more labored than usual.  Your breathing is faster than usual.  SEEK IMMEDIATE MEDICAL CARE IF:  You have shortness of breath while you are resting.  You have shortness of breath that prevents you from:  Being able to talk.  Performing your usual physical activities.  You have chest pain lasting longer than 5 minutes.  Your skin color is more cyanotic than usual.  You measure low oxygen saturations for longer than 5 minutes with a pulse oximeter. MAKE SURE YOU:  Understand these instructions.  Will watch your condition.  Will get help right away if you are not doing well or get worse. Document Released: 08/04/2005 Document Revised: 03/11/2014 Document Reviewed: 06/21/2013  [facility] Patient Information 2015 [place], [place]. This information is not intended to replace advice given to you by your health care provider. Make sure you discuss any questions you have with your health care provider.   Consultations:  none  Procedures Performed:  Dg Chest 2 View (if Patient Has Fever And/or Copd)  08/12/2014   CLINICAL DATA:  Initial evaluation for shortness of breath  EXAM: CHEST  2 VIEW  COMPARISON:  Prior radiograph from 07/27/2014  FINDINGS: The cardiac and mediastinal silhouettes are stable in size and contour, and remain within normal limits.   Emphysematous changes again noted. No airspace consolidation, pleural effusion, or pulmonary edema is identified. There is no pneumothorax.  Surgical clips overlie the right axilla.  No acute osseous abnormality identified. ACDF overlies the cervical spine.  IMPRESSION: Emphysema.  No other acute cardiopulmonary abnormality.   Electronically Signed   By: Jeannine Boga M.D.   On: 08/12/2014 05:27   Dg Chest 2 View  07/27/2014   CLINICAL DATA:  Worsening asthma.  COPD.  EXAM: CHEST  2 VIEW  COMPARISON:  03/09/2014.  FINDINGS: Interval mildly enlarged cardiac silhouette. The lungs remain hyperexpanded minimally  prominent interstitial markings. Stable linear scarring at the left lung base. Interval small amount of linear atelectasis or scarring at the right lung base. Old, healed right rib fractures. Right axillary surgical clips. Bilateral shoulder degenerative changes. Superior migration of both humeral heads, suggesting chronic rotator cuff tears. Cervical spine fixation hardware. Thoracic spine degenerative changes.  IMPRESSION: 1. No acute abnormality. 2. Stable changes of COPD.   Electronically Signed   By: Enrique Sack M.D.   On: 07/27/2014 09:22   Admission HPI:  [name] is a 63 year old woman with PMH significant for COPD, tobacco use, HTN, DM2, and depression presenting for evaluation of persistent shortness of breath. She has had shortness of breath for 2 weeks now that acutely worsened the night prior to her presentation. Per her report she had URI 2 weeks ago and has been sick ever since. She has been seen at the ED on 9/19 for COPD exacerbation, was discharged home on antibiotics, was advised to use Symbicort. She was noncompliant with the treatment started at the ED and her symptoms did not improve. She was seen at her PCP's office on 10/01 and was started on Levaquin, prednisone, home nebulizer and Symbicort twice daily with good relief but had acute decompensation last night and  returned to the ED. She continues to have scant clear/yellow sputum and has chest wall pain that is worse with cough, deeps breathing and with movement. She continues to smoke cigarettes.  In the ED she was given Duoneb treatment x1 with not much relief and started on continuous nebulizer treatment with mild improvement of her wheezing. The IMTS team was called to admit the patient given her persistent COPD exacerbation despite outpatient treatment.    Admission Physical Exam: Nursing note and vitals reviewed.  Constitutional: She is oriented to person, place, and time. She appears well-developed and well-nourished. No distress.  Speaks in full sentences  HENT:  Mouth/Throat: Oropharynx is clear and moist. No oropharyngeal exudate.  Pale and boggy nasal turbinates bilaterally  Eyes: Conjunctivae are normal. No scleral icterus.  Cardiovascular:  Mild tachycardia Respiratory: Effort normal. No respiratory distress. She has wheezes. She has no rales. She exhibits tenderness.  Faint expiratory wheezes through al lung fields Chest wall tenderness at the anterior mid central chest and bilateral ribs Deep breath causes continuous cough  GI: Soft. Bowel sounds are normal. She exhibits no distension. There is no tenderness. There is no rebound and no guarding.  Musculoskeletal: She exhibits no edema and no tenderness.  Neurological: She is alert and oriented to person, place, and time.  Skin: Skin is warm and dry. No rash noted. She is not diaphoretic. No erythema.  Psychiatric: She has a normal mood and affect.   Hospital Course by problem list: Principal Problem:   COPD exacerbation Active Problems:   DM2 (diabetes mellitus, type 2)   TOBACCO ABUSE   Depression   Hypertension, benign essential, goal below 140/90   [name] is a 63 yo woman with COPD who was admitted to [facility] for care of her URI and subsequent COPD exacerbation. She failed outpatient management on levaquin,  prednisone and nebulizer treatments, though her compliance is questionable. As noted in Dr. Ferd Glassing most recent clinic notes, the patient has never had PFTs. Her September, 2015 chest xray was significant for emphysematous changes. Her ongoing cigarette use also contributes to her disease.   COPD Exacerbation: Likely precipitated by URI. Also, there was some patient misunderstanding about which inhalers to use; this  may be contributing to her exacerbation. She states that she has not been using her Symbicort due to cost. She has been using Ventolin, Spiriva and duonebs. Patient did not need supplemental O2 in the hospital. She had some chest pain on coughing (lower thorax tenderness); troponins were negative. Low cost alternative through [facility] were arranged for the patient, such that she could pick them up on the day of discharge. The manner of inhaler use was discussed with the patient using teach-back methods.  HTN: well-controlled on coreg, lisinopril-HCTZ daily   DMII: well-controlled with last HgA1c 6.8% on 06/14/2014. On metformin 500 BID at home. Needed SSI in the hospital after solumedrol dose; she then normalized.  Depression and Anxiety: multiple stressors at home including daughter's recent broken kneecap. Continued home Celexa 40.   Tobacco Use: 10 pack year history with current ongoing use. Patient mentioned that she might be interested in trying Chantix with her husband, but is concerned about the expense. Cessation counseling was provided.  Discharge Vitals:   BP 124/64  Pulse 81  Temp(Src) 98 F (36.7 C) (Oral)  Resp 18  Ht _0  (1.702 m)  Wt 214 lb 3.2 oz (97.16 kg)  BMI 33.54 kg/m2  SpO2 94%  Discharge Labs:  Results for orders placed during the hospital encounter of 08/12/14 (from the past 24 hour(s))  GLUCOSE, CAPILLARY     Status: Abnormal   Collection Time    08/12/14  4:08 PM      Result Value Ref Range   Glucose-Capillary >600 (*) 70 - 99 mg/dL   Comment 1 Notify  RN    GLUCOSE, CAPILLARY     Status: Abnormal   Collection Time    08/12/14  4:29 PM      Result Value Ref Range   Glucose-Capillary >600 (*) 70 - 99 mg/dL   Comment 1 Notify RN    GLUCOSE, CAPILLARY     Status: Abnormal   Collection Time    08/12/14  9:00 PM      Result Value Ref Range   Glucose-Capillary 457 (*) 70 - 99 mg/dL  GLUCOSE, CAPILLARY     Status: Abnormal   Collection Time    08/13/14  2:32 AM      Result Value Ref Range   Glucose-Capillary 354 (*) 70 - 99 mg/dL  BASIC METABOLIC PANEL     Status: Abnormal   Collection Time    08/13/14  3:40 AM      Result Value Ref Range   Sodium 134 (*) 137 - 147 mEq/L   Potassium 4.1  3.7 - 5.3 mEq/L   Chloride 93 (*) 96 - 112 mEq/L   CO2 23  19 - 32 mEq/L   Glucose, Bld 396 (*) 70 - 99 mg/dL   BUN 15  6 - 23 mg/dL   Creatinine, Ser 0.67  0.50 - 1.10 mg/dL   Calcium 9.1  8.4 - 10.5 mg/dL   GFR calc non Af Amer >90  >90 mL/min   GFR calc Af Amer >90  >90 mL/min   Anion gap 18 (*) 5 - 15  CBC     Status: Abnormal   Collection Time    08/13/14  3:40 AM      Result Value Ref Range   WBC 8.6  4.0 - 10.5 K/uL   RBC 4.02  3.87 - 5.11 MIL/uL   Hemoglobin 11.7 (*) 12.0 - 15.0 g/dL   HCT 33.9 (*) 36.0 - 46.0 %   MCV  84.3  78.0 - 100.0 fL   MCH 29.1  26.0 - 34.0 pg   MCHC 34.5  30.0 - 36.0 g/dL   RDW 13.0  11.5 - 15.5 %   Platelets 291  150 - 400 K/uL  GLUCOSE, CAPILLARY     Status: Abnormal   Collection Time    08/13/14  5:43 AM      Result Value Ref Range   Glucose-Capillary 352 (*) 70 - 99 mg/dL    Signed: Drucilla Schmidt, MD 08/13/2014, 11:39 AM    Services Ordered on Discharge: none Equipment Ordered on Discharge: none

## 2014-08-15 ENCOUNTER — Telehealth: Payer: Self-pay | Admitting: *Deleted

## 2014-08-15 NOTE — Telephone Encounter (Signed)
Pt called and would like you to call her concerning her hospital discharge.   She would not tell me what the problem was but states she is physically okay. Pt # [phone]

## 2014-08-16 ENCOUNTER — Other Ambulatory Visit: Payer: Self-pay | Admitting: Internal Medicine

## 2014-08-16 ENCOUNTER — Encounter: Payer: Self-pay | Admitting: Internal Medicine

## 2014-08-16 ENCOUNTER — Ambulatory Visit: Payer: Medicare Other | Admitting: Internal Medicine

## 2014-08-16 MED ORDER — CYCLOBENZAPRINE HCL 5 MG PO TABS: 5.0000 mg | ORAL_TABLET | Freq: Every evening | ORAL | Status: DC | PRN

## 2014-08-16 NOTE — Telephone Encounter (Signed)
Thanks.  I returned [name]'s call.

## 2014-08-26 ENCOUNTER — Ambulatory Visit: Payer: Medicare Other | Admitting: Internal Medicine

## 2014-08-26 ENCOUNTER — Encounter: Payer: Self-pay | Admitting: Internal Medicine

## 2014-08-26 NOTE — Addendum Note (Signed)
Addended by: Hulan Fray on: 08/26/2014 06:43 PM   Modules accepted: Orders

## 2014-09-03 ENCOUNTER — Encounter: Payer: Self-pay | Admitting: Internal Medicine

## 2014-09-03 ENCOUNTER — Other Ambulatory Visit (HOSPITAL_COMMUNITY): Payer: Self-pay | Admitting: Internal Medicine

## 2014-09-03 ENCOUNTER — Ambulatory Visit (INDEPENDENT_AMBULATORY_CARE_PROVIDER_SITE_OTHER): Payer: Medicare Other | Admitting: Internal Medicine

## 2014-09-03 ENCOUNTER — Ambulatory Visit (HOSPITAL_COMMUNITY)
Admission: RE | Admit: 2014-09-03 | Discharge: 2014-09-03 | Disposition: A | Discharge status: Home / Self Care | Payer: Medicare Other | Source: Ambulatory Visit | Attending: Internal Medicine | Admitting: Internal Medicine

## 2014-09-03 VITALS — BP 125/62 | HR 94 | Temp 98.0°F | Ht 67.0 in | Wt 215.5 lb

## 2014-09-03 DIAGNOSIS — J441 Chronic obstructive pulmonary disease with (acute) exacerbation: Secondary | ICD-10-CM | POA: Insufficient documentation

## 2014-09-03 DIAGNOSIS — R109 Unspecified abdominal pain: Secondary | ICD-10-CM

## 2014-09-03 DIAGNOSIS — E1165 Type 2 diabetes mellitus with hyperglycemia: Secondary | ICD-10-CM

## 2014-09-03 DIAGNOSIS — R0602 Shortness of breath: Secondary | ICD-10-CM | POA: Diagnosis not present

## 2014-09-03 DIAGNOSIS — Z720 Tobacco use: Secondary | ICD-10-CM | POA: Diagnosis not present

## 2014-09-03 DIAGNOSIS — E119 Type 2 diabetes mellitus without complications: Secondary | ICD-10-CM

## 2014-09-03 DIAGNOSIS — F17200 Nicotine dependence, unspecified, uncomplicated: Secondary | ICD-10-CM

## 2014-09-03 DIAGNOSIS — J440 Chronic obstructive pulmonary disease with acute lower respiratory infection: Secondary | ICD-10-CM | POA: Diagnosis not present

## 2014-09-03 DIAGNOSIS — IMO0001 Reserved for inherently not codable concepts without codable children: Secondary | ICD-10-CM

## 2014-09-03 LAB — GLUCOSE, CAPILLARY: Glucose-Capillary: 207 mg/dL — ABNORMAL HIGH (ref 70–99)

## 2014-09-03 LAB — POCT GLYCOSYLATED HEMOGLOBIN (HGB A1C): HEMOGLOBIN A1C: 8.3

## 2014-09-03 MED ORDER — ALBUTEROL SULFATE (2.5 MG/3ML) 0.083% IN NEBU
2.5000 mg | INHALATION_SOLUTION | Freq: Once | RESPIRATORY_TRACT | Status: AC
  Administered 2014-09-03: 2.5 mg via RESPIRATORY_TRACT

## 2014-09-03 MED ORDER — IPRATROPIUM BROMIDE 0.02 % IN SOLN
0.5000 mg | Freq: Once | RESPIRATORY_TRACT | Status: AC
  Administered 2014-09-03: 0.5 mg via RESPIRATORY_TRACT

## 2014-09-03 MED ORDER — METFORMIN HCL 1000 MG PO TABS: 1000.0000 mg | ORAL_TABLET | Freq: Two times a day (BID) | ORAL | Status: DC

## 2014-09-03 MED ORDER — PREDNISONE 20 MG PO TABS
40.0000 mg | ORAL_TABLET | Freq: Every day | ORAL | Status: DC
Start: 2014-09-03 — End: 2014-09-03

## 2014-09-03 MED ORDER — IPRATROPIUM-ALBUTEROL 0.5-2.5 (3) MG/3ML IN SOLN: 3.0000 mL | RESPIRATORY_TRACT | Status: DC | PRN

## 2014-09-03 NOTE — Addendum Note (Signed)
Addended by: Jones Bales on: 09/03/2014 04:59 PM   Modules accepted: Orders, Medications

## 2014-09-03 NOTE — Assessment & Plan Note (Addendum)
-  check HA1c today -continue metformin 500mg  twice daily   ADDENDUM: Increase metformin to 1000mg  twice daily-->HA1c elevated today at 8.3. May need to add glipizide at next OV

## 2014-09-03 NOTE — Assessment & Plan Note (Signed)
Advised to quit smoking again.  She stated that she could not afford the patches? -continue to encourage smoking cessation

## 2014-09-03 NOTE — Assessment & Plan Note (Signed)
Pt p/w cough that is sometimes productive and shortness of breath since her hospitalization a couple of weeks ago.  She was hospitalized for a COPD exacerbation and discharged with prednisone and abx.  It has been presumed that non-compliance is an issue.  She also reports a sick contact of grandchild recently with a cold.  Reports compliance with inhalers.  Denies any fever/chills, CP, abdominal pain, N/V. She reports using her nebulizer machine every 4-6 hours more recently but states that it "makes her cough."  CXR completed today was no acute disease.  -add ipratropium nebulization for home use  -continue other meds -order PFTs today  -advised to return to [facility] if not improved in a week

## 2014-09-03 NOTE — Patient Instructions (Addendum)
Thank you for your visit today.   Please return to the internal medicine clinic in 1 week if not feeling any better.    I will call you with the results of your chest XR if anything is abnormal. Please discontinue spiriva while using the nebulizer treatment combination. Use the nebulizer combination every 4 hours as needed. You likely may have a viral illness as well. Your current medical regimen is effective;  continue present plan and take all medications as prescribed.     Please be sure to bring all of your medications with you to every visit; this includes herbal supplements, vitamins, eye drops, and any over-the-counter medications.   Should you have any questions regarding your medications and/or any new or worsening symptoms, please be sure to call the clinic at [phone].   If you believe that you are suffering from a life threatening condition or one that may result in the loss of limb or function, then you should call 911 or proceed to the nearest Emergency Department.    Smoking Cessation, Tips for Success If you are ready to quit smoking, congratulations! You have chosen to help yourself be healthier. Cigarettes bring nicotine, tar, carbon monoxide, and other irritants into your body. Your lungs, heart, and blood vessels will be able to work better without these poisons. There are many different ways to quit smoking. Nicotine gum, nicotine patches, a nicotine inhaler, or nicotine nasal spray can help with physical craving. Hypnosis, support groups, and medicines help break the habit of smoking. WHAT THINGS CAN I DO TO MAKE QUITTING EASIER?  Here are some tips to help you quit for good:  Pick a date when you will quit smoking completely. Tell all of your friends and family about your plan to quit on that date.  Do not try to slowly cut down on the number of cigarettes you are smoking. Pick a quit date and quit smoking completely starting on that day.  Throw away all  cigarettes.   Clean and remove all ashtrays from your home, work, and car.  On a card, write down your reasons for quitting. Carry the card with you and read it when you get the urge to smoke.  Cleanse your body of nicotine. Drink enough water and fluids to keep your urine clear or pale yellow. Do this after quitting to flush the nicotine from your body.  Learn to predict your moods. Do not let a bad situation be your excuse to have a cigarette. Some situations in your life might tempt you into wanting a cigarette.  Never have "just one" cigarette. It leads to wanting another and another. Remind yourself of your decision to quit.  Change habits associated with smoking. If you smoked while driving or when feeling stressed, try other activities to replace smoking. Stand up when drinking your coffee. Brush your teeth after eating. Sit in a different chair when you read the paper. Avoid alcohol while trying to quit, and try to drink fewer caffeinated beverages. Alcohol and caffeine may urge you to smoke.  Avoid foods and drinks that can trigger a desire to smoke, such as sugary or spicy foods and alcohol.  Ask people who smoke not to smoke around you.  Have something planned to do right after eating or having a cup of coffee. For example, plan to take a walk or exercise.  Try a relaxation exercise to calm you down and decrease your stress. Remember, you may be tense and nervous for the first  2 weeks after you quit, but this will pass.  Find new activities to keep your hands busy. Play with a pen, coin, or rubber band. Doodle or draw things on paper.  Brush your teeth right after eating. This will help cut down on the craving for the taste of tobacco after meals. You can also try mouthwash.   Use oral substitutes in place of cigarettes. Try using lemon drops, carrots, cinnamon sticks, or chewing gum. Keep them handy so they are available when you have the urge to smoke.  When you have the  urge to smoke, try deep breathing.  Designate your home as a nonsmoking area.  If you are a heavy smoker, ask your health care provider about a prescription for nicotine chewing gum. It can ease your withdrawal from nicotine.  Reward yourself. Set aside the cigarette money you save and buy yourself something nice.  Look for support from others. Join a support group or smoking cessation program. Ask someone at home or at work to help you with your plan to quit smoking.  Always ask yourself, "Do I need this cigarette or is this just a reflex?" Tell yourself, "Today, I choose not to smoke," or "I do not want to smoke." You are reminding yourself of your decision to quit.  Do not replace cigarette smoking with electronic cigarettes (commonly called e-cigarettes). The safety of e-cigarettes is unknown, and some may contain harmful chemicals.  If you relapse, do not give up! Plan ahead and think about what you will do the next time you get the urge to smoke. HOW WILL I FEEL WHEN I QUIT SMOKING? You may have symptoms of withdrawal because your body is used to nicotine (the addictive substance in cigarettes). You may crave cigarettes, be irritable, feel very hungry, cough often, get headaches, or have difficulty concentrating. The withdrawal symptoms are only temporary. They are strongest when you first quit but will go away within 10-14 days. When withdrawal symptoms occur, stay in control. Think about your reasons for quitting. Remind yourself that these are signs that your body is healing and getting used to being without cigarettes. Remember that withdrawal symptoms are easier to treat than the major diseases that smoking can cause.  Even after the withdrawal is over, expect periodic urges to smoke. However, these cravings are generally short lived and will go away whether you smoke or not. Do not smoke! WHAT RESOURCES ARE AVAILABLE TO HELP ME QUIT SMOKING? Your health care provider can direct you to  community resources or hospitals for support, which may include:  Group support.  Education.  Hypnosis.  Therapy. Document Released: 07/23/2004 Document Revised: 03/11/2014 Document Reviewed: 04/12/2013 [facility] Patient Information 2015 [place], [place]. This information is not intended to replace advice given to you by your health care provider. Make sure you discuss any questions you have with your health care provider.

## 2014-09-03 NOTE — Progress Notes (Signed)
Patient ID: [name], female   DOB: 1951-09-02, 63 y.o.   MRN: [id number]    Subjective:   [name]    DOB: 1951/02/26 63 y.o.    MRN: [id number] Health Maintenance Due: Health Maintenance Due  Topic Date Due  . Pap Smear  12/30/1968  . Colonoscopy  12/30/2000  . Zostavax  12/30/2010  . Foot Exam  01/03/2014  . Ophthalmology Exam  02/08/2014    _________________________________________________  HPI: [name] is a 63 y.o. female here for an acute  visit.  Pt has a PMH outlined below.  Please see problem-based charting assessment and plan note for further details of medical issues addressed at today's visit.  PMH: Past Medical History  Diagnosis Date  . Hyperlipidemia   . Hypertension   . Tobacco abuse   . COPD (chronic obstructive pulmonary disease)     History of multiple hospital admissions for exercabation   . Asthma   . Breast cancer 1991    s/p lumpectomy, chemotherapy and radiation therapy in 1991. Mammogram in 2007 was normal.  . Sigmoid diverticulitis 80/2008  . Anxiety   . Depression   . Obesity   . GERD (gastroesophageal reflux disease)   . Heart murmur 10/05/11    "first time I ever heard I had one was today"  . Pneumonia   . Shortness of breath 10/05/11    "at rest; lying down; w/exertion"  . Diabetes mellitus   . Bronchitis     h/o  . Diarrhea     h/o  . Constipated     h/o    Medications: Current Outpatient Prescriptions on File Prior to Visit  Medication Sig Dispense Refill  . ACCU-CHEK FASTCLIX LANCETS MISC 1 each by Other route See admin instructions. Check blood sugar daily as needed for high blood sugar.      . albuterol (PROVENTIL) (2.5 MG/3ML) 0.083% nebulizer solution Take 3 mLs (2.5 mg total) by nebulization every 6 (six) hours as needed for wheezing or shortness of breath.  1 vial  0  . albuterol (VENTOLIN HFA) 108 (90 BASE) MCG/ACT inhaler Inhale 2 puffs into the lungs every 6 (six)  hours as needed for wheezing or shortness of breath.  1 Inhaler  3  . benzonatate (TESSALON) 100 MG capsule Take 1 capsule (100 mg total) by mouth 3 (three) times daily.  20 capsule  0  . Blood Glucose Monitoring Suppl (ACCU-CHEK NANO SMARTVIEW) W/DEVICE KIT 1 each by Other route See admin instructions. Check blood sugar daily as needed for high blood sugar.      . budesonide-formoterol (SYMBICORT) 80-4.5 MCG/ACT inhaler Inhale 2 puffs into the lungs 2 (two) times daily as needed. For asthma  1 Inhaler  12  . carvedilol (COREG) 6.25 MG tablet Take 6.25 mg by mouth 2 (two) times daily with a meal.      . citalopram (CELEXA) 40 MG tablet Take 40 mg by mouth daily.      . cyclobenzaprine (FLEXERIL) 5 MG tablet Take 1 tablet (5 mg total) by mouth at bedtime as needed for muscle spasms.  30 tablet  0  . Dextromethorphan-Guaifenesin (ROBITUSSIN SUGAR FREE) 10-100 MG/5ML liquid Take 5 mLs by mouth every 12 (twelve) hours.  120 mL  2  . fluticasone (FLONASE) 50 MCG/ACT nasal spray Place 1 spray into both nostrils daily as needed for allergies.  16 g  2  . glucose blood (ACCU-CHEK SMARTVIEW) test strip 1 each by Other  route See admin instructions. Check blood sugar daily as needed for high blood sugar.      . lisinopril-hydrochlorothiazide (PRINZIDE,ZESTORETIC) 20-25 MG per tablet Take 1 tablet by mouth daily.      . metFORMIN (GLUCOPHAGE) 500 MG tablet Take 500 mg by mouth 2 (two) times daily with a meal.      . pravastatin (PRAVACHOL) 40 MG tablet Take 40 mg by mouth daily.      . sodium chloride (OCEAN) 0.65 % SOLN nasal spray Place 1 spray into both nostrils as needed for congestion.  15 mL  3  . sulfamethoxazole-trimethoprim (BACTRIM DS) 800-160 MG per tablet Take 1 tablet by mouth 2 (two) times daily.  10 tablet  0  . tiotropium (SPIRIVA) 18 MCG inhalation capsule Place 18 mcg into inhaler and inhale daily.      . [DISCONTINUED] albuterol (PROVENTIL,VENTOLIN) 90 MCG/ACT inhaler Inhale 2 puffs into the  lungs every 6 (six) hours as needed for wheezing.  17 g  12   No current facility-administered medications on file prior to visit.    Allergies: No Known Allergies  FH: Family History  Problem Relation Age of Onset  . Cancer Mother     SH: History   Social History  . Marital Status: Married    Spouse Name: N/A    Number of Children: N/A  . Years of Education: N/A   Social History Main Topics  . Smoking status: Current Some Day Smoker -- 0.20 packs/day for 40 years    Types: Cigarettes  . Smokeless tobacco: Never Used     Comment: slowly quitting.  . Alcohol Use: 2.4 oz/week    4 Cans of beer per week     Comment: "only on the weekends"  . Drug Use: No  . Sexual Activity: None   Other Topics Concern  . None   Social History Narrative   Lives in [place] with her husband.   Takes care of 3 grand children.   Trying to find a job, has financial difficulties.          Review of Systems: Constitutional: Negative for fever, chills and weight loss.  Eyes: Negative for blurred vision.  Respiratory: +cough and shortness of breath.  Cardiovascular: Negative for chest pain, palpitations and leg swelling.  Gastrointestinal: Negative for nausea, vomiting, abdominal pain, diarrhea, constipation and blood in stool.  Genitourinary: Negative for dysuria, urgency and frequency.  Musculoskeletal: Negative for myalgias and back pain.  Neurological: Negative for dizziness, weakness and headaches.     Objective:   Vital Signs: Filed Vitals:   09/03/14 1002 09/03/14 1003  BP:  125/62  Pulse:  94  Temp:  98 F (36.7 C)  TempSrc:  Oral  Height: _0  (1.702 m)   Weight: 215 lb 8 oz (97.75 kg)   SpO2:  98%      BP Readings from Last 3 Encounters:  09/03/14 125/62  08/13/14 114/49  08/08/14 140/72    Physical Exam: Constitutional: Vital signs reviewed.  Patient is well-developed and well-nourished in NAD and cooperative with exam.  Head: Normocephalic and  atraumatic. Eyes: PERRL, EOMI, conjunctivae nl, no scleral icterus.  Neck: Supple. Cardiovascular: RRR, no MRG. Pulmonary/Chest: normal effort, non-tender to palpation, mild wheezing throughout lung fields without rales or rhonchi. Abdominal: Soft. NT/ND +BS. Neurological: A&O x3, cranial nerves II-XII are grossly intact, moving all extremities. Extremities: 2+DP b/l; no pitting edema. Skin: Warm, dry and intact. No rash.  Most Recent Laboratory Results:  CMP  Component Value Date/Time   NA 134* 08/13/2014 0340   K 4.1 08/13/2014 0340   CL 93* 08/13/2014 0340   CO2 23 08/13/2014 0340   GLUCOSE 396* 08/13/2014 0340   BUN 15 08/13/2014 0340   CREATININE 0.67 08/13/2014 0340   CREATININE 0.73 12/20/2012 1110   CALCIUM 9.1 08/13/2014 0340   PROT 7.9 03/08/2014 0545   ALBUMIN 3.8 03/08/2014 0545   AST 18 03/08/2014 0545   ALT 8 03/08/2014 0545   ALKPHOS 106 03/08/2014 0545   BILITOT 0.3 03/08/2014 0545   GFRNONAA >90 08/13/2014 0340   GFRNONAA 89 12/20/2012 1110   GFRAA >90 08/13/2014 0340   GFRAA >89 12/20/2012 1110    CBC    Component Value Date/Time   WBC 8.6 08/13/2014 0340   RBC 4.02 08/13/2014 0340   HGB 11.7* 08/13/2014 0340   HCT 33.9* 08/13/2014 0340   PLT 291 08/13/2014 0340   MCV 84.3 08/13/2014 0340   MCH 29.1 08/13/2014 0340   MCHC 34.5 08/13/2014 0340   RDW 13.0 08/13/2014 0340   LYMPHSABS 1.4 07/27/2014 0744   MONOABS 0.3 07/27/2014 0744   EOSABS 0.4 07/27/2014 0744   BASOSABS 0.0 07/27/2014 0744    Lipid Panel Lab Results  Component Value Date   CHOL 170 06/14/2014   HDL 46 06/14/2014   LDLCALC 91 06/14/2014   TRIG 166* 06/14/2014   CHOLHDL 3.7 06/14/2014    HA1C Lab Results  Component Value Date   HGBA1C 8.3 09/03/2014    Urinalysis    Component Value Date/Time   COLORURINE YELLOW 02/15/2012 [place] 02/15/2012 1208   LABSPEC 1.013 02/15/2012 1208   PHURINE 6.0 02/15/2012 1208   GLUCOSEU NEGATIVE 02/15/2012 [place] 02/15/2012 [place] 02/15/2012 [place] 02/15/2012 1208   PROTEINUR NEGATIVE 02/15/2012 1208   UROBILINOGEN 1.0 02/15/2012 1208   NITRITE NEGATIVE 02/15/2012 1208   LEUKOCYTESUR NEGATIVE 02/15/2012 1208    Urine Microalbumin Lab Results  Component Value Date   MICROALBUR 1.98* 12/01/2012    Imaging N/A   Assessment & Plan:   Assessment and plan was discussed and formulated with my attending.

## 2014-09-03 NOTE — Telephone Encounter (Signed)
Close encounter 

## 2014-09-04 ENCOUNTER — Other Ambulatory Visit: Payer: Self-pay | Admitting: Licensed Clinical Social Worker

## 2014-09-04 DIAGNOSIS — J441 Chronic obstructive pulmonary disease with (acute) exacerbation: Secondary | ICD-10-CM

## 2014-09-06 ENCOUNTER — Other Ambulatory Visit: Payer: Self-pay | Admitting: Internal Medicine

## 2014-09-06 NOTE — Progress Notes (Signed)
Internal Medicine Clinic Attending  Case discussed with Dr. Gill soon after the resident saw the patient.  We reviewed the resident's history and exam and pertinent patient test results.  I agree with the assessment, diagnosis, and plan of care documented in the resident's note.  

## 2014-09-10 ENCOUNTER — Telehealth: Payer: Self-pay | Admitting: *Deleted

## 2014-09-10 NOTE — Telephone Encounter (Signed)
Talked with pt about note from Dr Gordy Levan about metformin. Also mailed instructions to pt. Hilda Blades Vikki Gains RN 09/10/14 10:58AM

## 2014-09-10 NOTE — Telephone Encounter (Signed)
-----   Message from Jones Bales, MD sent at 09/08/2014 11:11 PM EST ----- Hilda Blades, Will you please call Ms. Marlowe and tell her to increase her metformin to 1000mg  twice daily-->see below (is currently taking 500mg  twice daily).   1. Please tell her to take 1000mg  (2 tabs) in the AM with breakfast and then 500mg  (1 tab) in the PM with dinner for 1 week.  2. Then, tell her to take 1000mg  in the AM with breakfast and 1000mg  in the PM with dinner starting the 2nd week.   Thanks so much for all of your help!

## 2014-09-11 ENCOUNTER — Encounter (HOSPITAL_COMMUNITY): Payer: Self-pay

## 2014-09-11 ENCOUNTER — Emergency Department (HOSPITAL_COMMUNITY): Payer: Medicare Other

## 2014-09-11 ENCOUNTER — Inpatient Hospital Stay (HOSPITAL_COMMUNITY)
Admission: EM | Admit: 2014-09-11 | Discharge: 2014-09-18 | DRG: 208 | Disposition: A | Discharge status: Home Health | Payer: Medicare Other | Attending: Infectious Diseases | Admitting: Infectious Diseases

## 2014-09-11 DIAGNOSIS — J9600 Acute respiratory failure, unspecified whether with hypoxia or hypercapnia: Secondary | ICD-10-CM | POA: Diagnosis not present

## 2014-09-11 DIAGNOSIS — E876 Hypokalemia: Secondary | ICD-10-CM | POA: Diagnosis present

## 2014-09-11 DIAGNOSIS — B9789 Other viral agents as the cause of diseases classified elsewhere: Secondary | ICD-10-CM | POA: Diagnosis present

## 2014-09-11 DIAGNOSIS — Z923 Personal history of irradiation: Secondary | ICD-10-CM

## 2014-09-11 DIAGNOSIS — J9602 Acute respiratory failure with hypercapnia: Secondary | ICD-10-CM | POA: Diagnosis not present

## 2014-09-11 DIAGNOSIS — J9690 Respiratory failure, unspecified, unspecified whether with hypoxia or hypercapnia: Secondary | ICD-10-CM | POA: Diagnosis not present

## 2014-09-11 DIAGNOSIS — I82409 Acute embolism and thrombosis of unspecified deep veins of unspecified lower extremity: Secondary | ICD-10-CM | POA: Diagnosis not present

## 2014-09-11 DIAGNOSIS — K219 Gastro-esophageal reflux disease without esophagitis: Secondary | ICD-10-CM | POA: Diagnosis present

## 2014-09-11 DIAGNOSIS — R0600 Dyspnea, unspecified: Secondary | ICD-10-CM | POA: Diagnosis not present

## 2014-09-11 DIAGNOSIS — J189 Pneumonia, unspecified organism: Secondary | ICD-10-CM | POA: Diagnosis not present

## 2014-09-11 DIAGNOSIS — E669 Obesity, unspecified: Secondary | ICD-10-CM | POA: Diagnosis present

## 2014-09-11 DIAGNOSIS — Z853 Personal history of malignant neoplasm of breast: Secondary | ICD-10-CM | POA: Diagnosis not present

## 2014-09-11 DIAGNOSIS — Z7952 Long term (current) use of systemic steroids: Secondary | ICD-10-CM

## 2014-09-11 DIAGNOSIS — F418 Other specified anxiety disorders: Secondary | ICD-10-CM

## 2014-09-11 DIAGNOSIS — Z79899 Other long term (current) drug therapy: Secondary | ICD-10-CM

## 2014-09-11 DIAGNOSIS — Z789 Other specified health status: Secondary | ICD-10-CM | POA: Diagnosis not present

## 2014-09-11 DIAGNOSIS — J439 Emphysema, unspecified: Secondary | ICD-10-CM | POA: Diagnosis not present

## 2014-09-11 DIAGNOSIS — I959 Hypotension, unspecified: Secondary | ICD-10-CM | POA: Diagnosis not present

## 2014-09-11 DIAGNOSIS — E785 Hyperlipidemia, unspecified: Secondary | ICD-10-CM | POA: Diagnosis present

## 2014-09-11 DIAGNOSIS — J441 Chronic obstructive pulmonary disease with (acute) exacerbation: Principal | ICD-10-CM | POA: Diagnosis present

## 2014-09-11 DIAGNOSIS — B348 Other viral infections of unspecified site: Secondary | ICD-10-CM | POA: Diagnosis not present

## 2014-09-11 DIAGNOSIS — J440 Chronic obstructive pulmonary disease with acute lower respiratory infection: Secondary | ICD-10-CM | POA: Diagnosis present

## 2014-09-11 DIAGNOSIS — Z720 Tobacco use: Secondary | ICD-10-CM

## 2014-09-11 DIAGNOSIS — J9622 Acute and chronic respiratory failure with hypercapnia: Secondary | ICD-10-CM | POA: Diagnosis not present

## 2014-09-11 DIAGNOSIS — R0602 Shortness of breath: Secondary | ICD-10-CM | POA: Diagnosis not present

## 2014-09-11 DIAGNOSIS — E119 Type 2 diabetes mellitus without complications: Secondary | ICD-10-CM | POA: Diagnosis present

## 2014-09-11 DIAGNOSIS — J449 Chronic obstructive pulmonary disease, unspecified: Secondary | ICD-10-CM | POA: Diagnosis not present

## 2014-09-11 DIAGNOSIS — J9601 Acute respiratory failure with hypoxia: Secondary | ICD-10-CM | POA: Insufficient documentation

## 2014-09-11 DIAGNOSIS — R451 Restlessness and agitation: Secondary | ICD-10-CM | POA: Diagnosis not present

## 2014-09-11 DIAGNOSIS — Z9221 Personal history of antineoplastic chemotherapy: Secondary | ICD-10-CM

## 2014-09-11 DIAGNOSIS — Z978 Presence of other specified devices: Secondary | ICD-10-CM | POA: Insufficient documentation

## 2014-09-11 DIAGNOSIS — I10 Essential (primary) hypertension: Secondary | ICD-10-CM | POA: Diagnosis present

## 2014-09-11 DIAGNOSIS — F17210 Nicotine dependence, cigarettes, uncomplicated: Secondary | ICD-10-CM | POA: Diagnosis present

## 2014-09-11 DIAGNOSIS — F419 Anxiety disorder, unspecified: Secondary | ICD-10-CM | POA: Diagnosis present

## 2014-09-11 DIAGNOSIS — T380X5A Adverse effect of glucocorticoids and synthetic analogues, initial encounter: Secondary | ICD-10-CM | POA: Diagnosis present

## 2014-09-11 DIAGNOSIS — F329 Major depressive disorder, single episode, unspecified: Secondary | ICD-10-CM | POA: Diagnosis present

## 2014-09-11 DIAGNOSIS — I2699 Other pulmonary embolism without acute cor pulmonale: Secondary | ICD-10-CM | POA: Diagnosis not present

## 2014-09-11 DIAGNOSIS — J383 Other diseases of vocal cords: Secondary | ICD-10-CM | POA: Diagnosis not present

## 2014-09-11 DIAGNOSIS — Z0189 Encounter for other specified special examinations: Secondary | ICD-10-CM

## 2014-09-11 DIAGNOSIS — Z4582 Encounter for adjustment or removal of myringotomy device (stent) (tube): Secondary | ICD-10-CM | POA: Diagnosis not present

## 2014-09-11 DIAGNOSIS — I517 Cardiomegaly: Secondary | ICD-10-CM | POA: Diagnosis not present

## 2014-09-11 DIAGNOSIS — R51 Headache: Secondary | ICD-10-CM | POA: Diagnosis not present

## 2014-09-11 LAB — GLUCOSE, CAPILLARY
GLUCOSE-CAPILLARY: 217 mg/dL — AB (ref 70–99)
GLUCOSE-CAPILLARY: 284 mg/dL — AB (ref 70–99)
Glucose-Capillary: 349 mg/dL — ABNORMAL HIGH (ref 70–99)

## 2014-09-11 LAB — HIV ANTIBODY (ROUTINE TESTING W REFLEX): HIV 1&2 Ab, 4th Generation: NONREACTIVE

## 2014-09-11 LAB — CBC
HEMATOCRIT: 35.6 % — AB (ref 36.0–46.0)
Hemoglobin: 12.1 g/dL (ref 12.0–15.0)
MCH: 29.3 pg (ref 26.0–34.0)
MCHC: 34 g/dL (ref 30.0–36.0)
MCV: 86.2 fL (ref 78.0–100.0)
Platelets: 315 10*3/uL (ref 150–400)
RBC: 4.13 MIL/uL (ref 3.87–5.11)
RDW: 13.2 % (ref 11.5–15.5)
WBC: 8.3 10*3/uL (ref 4.0–10.5)

## 2014-09-11 LAB — BASIC METABOLIC PANEL
Anion gap: 15 (ref 5–15)
BUN: 11 mg/dL (ref 6–23)
CO2: 25 mEq/L (ref 19–32)
Calcium: 8.8 mg/dL (ref 8.4–10.5)
Chloride: 98 mEq/L (ref 96–112)
Creatinine, Ser: 0.82 mg/dL (ref 0.50–1.10)
GFR calc Af Amer: 86 mL/min — ABNORMAL LOW (ref >90)
GFR, EST NON AFRICAN AMERICAN: 75 mL/min — AB (ref >90)
Glucose, Bld: 139 mg/dL — ABNORMAL HIGH (ref 70–99)
Potassium: 3.5 mEq/L — ABNORMAL LOW (ref 3.7–5.3)
Sodium: 138 mEq/L (ref 137–147)

## 2014-09-11 LAB — I-STAT TROPONIN, ED: Troponin i, poc: 0.02 ng/mL (ref 0.00–0.08)

## 2014-09-11 MED ORDER — KETOROLAC TROMETHAMINE 15 MG/ML IJ SOLN
15.0000 mg | Freq: Once | INTRAMUSCULAR | Status: AC
  Administered 2014-09-11: 15 mg via INTRAVENOUS
  Filled 2014-09-11: qty 1

## 2014-09-11 MED ORDER — IPRATROPIUM-ALBUTEROL 0.5-2.5 (3) MG/3ML IN SOLN
3.0000 mL | Freq: Once | RESPIRATORY_TRACT | Status: AC
  Administered 2014-09-11: 3 mL via RESPIRATORY_TRACT
  Filled 2014-09-11: qty 3

## 2014-09-11 MED ORDER — ALBUTEROL SULFATE (2.5 MG/3ML) 0.083% IN NEBU
2.5000 mg | INHALATION_SOLUTION | RESPIRATORY_TRACT | Status: AC | PRN
  Administered 2014-09-11: 2.5 mg via RESPIRATORY_TRACT
  Filled 2014-09-11: qty 3

## 2014-09-11 MED ORDER — GUAIFENESIN-DM 100-10 MG/5ML PO SYRP
5.0000 mL | ORAL_SOLUTION | Freq: Two times a day (BID) | ORAL | Status: DC
  Administered 2014-09-11 (×2): 5 mL via ORAL
  Filled 2014-09-11 (×4): qty 5

## 2014-09-11 MED ORDER — PREDNISONE 50 MG PO TABS
50.0000 mg | ORAL_TABLET | Freq: Every day | ORAL | Status: DC
Start: 2014-09-12 — End: 2014-09-12
  Administered 2014-09-12: 50 mg via ORAL
  Filled 2014-09-11 (×3): qty 1

## 2014-09-11 MED ORDER — CARVEDILOL 6.25 MG PO TABS
6.2500 mg | ORAL_TABLET | Freq: Two times a day (BID) | ORAL | Status: DC
  Administered 2014-09-11 – 2014-09-12 (×3): 6.25 mg via ORAL
  Filled 2014-09-11 (×6): qty 1

## 2014-09-11 MED ORDER — HEPARIN SODIUM (PORCINE) 5000 UNIT/ML IJ SOLN
5000.0000 [IU] | Freq: Three times a day (TID) | INTRAMUSCULAR | Status: DC
  Administered 2014-09-11 – 2014-09-18 (×21): 5000 [IU] via SUBCUTANEOUS
  Filled 2014-09-11 (×23): qty 1

## 2014-09-11 MED ORDER — FLUTICASONE PROPIONATE 50 MCG/ACT NA SUSP
1.0000 | Freq: Every day | NASAL | Status: DC | PRN
  Filled 2014-09-11 (×2): qty 16

## 2014-09-11 MED ORDER — PRAVASTATIN SODIUM 40 MG PO TABS
40.0000 mg | ORAL_TABLET | Freq: Every day | ORAL | Status: DC
  Administered 2014-09-11: 40 mg via ORAL
  Filled 2014-09-11 (×2): qty 1

## 2014-09-11 MED ORDER — METHYLPREDNISOLONE SODIUM SUCC 125 MG IJ SOLR
125.0000 mg | Freq: Once | INTRAMUSCULAR | Status: AC
  Administered 2014-09-11: 125 mg via INTRAVENOUS
  Filled 2014-09-11: qty 2

## 2014-09-11 MED ORDER — LORAZEPAM 2 MG/ML IJ SOLN
0.5000 mg | Freq: Once | INTRAMUSCULAR | Status: AC
  Administered 2014-09-11: 0.5 mg via INTRAVENOUS
  Filled 2014-09-11: qty 1

## 2014-09-11 MED ORDER — BUDESONIDE-FORMOTEROL FUMARATE 80-4.5 MCG/ACT IN AERO
2.0000 | INHALATION_SPRAY | Freq: Two times a day (BID) | RESPIRATORY_TRACT | Status: DC
  Administered 2014-09-11 – 2014-09-12 (×3): 2 via RESPIRATORY_TRACT
  Filled 2014-09-11: qty 6.9

## 2014-09-11 MED ORDER — SODIUM CHLORIDE 0.9 % IJ SOLN
3.0000 mL | Freq: Two times a day (BID) | INTRAMUSCULAR | Status: DC
  Administered 2014-09-11 – 2014-09-12 (×3): 3 mL via INTRAVENOUS

## 2014-09-11 MED ORDER — ACETAMINOPHEN 325 MG PO TABS
650.0000 mg | ORAL_TABLET | Freq: Once | ORAL | Status: AC
  Administered 2014-09-11: 650 mg via ORAL
  Filled 2014-09-11: qty 2

## 2014-09-11 MED ORDER — INSULIN ASPART 100 UNIT/ML ~~LOC~~ SOLN
0.0000 [IU] | Freq: Three times a day (TID) | SUBCUTANEOUS | Status: DC
  Administered 2014-09-11: 3 [IU] via SUBCUTANEOUS
  Administered 2014-09-11: 7 [IU] via SUBCUTANEOUS
  Administered 2014-09-12 (×2): 2 [IU] via SUBCUTANEOUS

## 2014-09-11 MED ORDER — NICOTINE 7 MG/24HR TD PT24
7.0000 mg | MEDICATED_PATCH | Freq: Every day | TRANSDERMAL | Status: DC
Start: 2014-09-11 — End: 2014-09-12
  Administered 2014-09-11 – 2014-09-12 (×2): 7 mg via TRANSDERMAL
  Filled 2014-09-11 (×2): qty 1

## 2014-09-11 MED ORDER — MORPHINE SULFATE 4 MG/ML IJ SOLN
4.0000 mg | Freq: Once | INTRAMUSCULAR | Status: AC
Start: 2014-09-11 — End: 2014-09-11
  Administered 2014-09-11: 4 mg via INTRAVENOUS
  Filled 2014-09-11: qty 1

## 2014-09-11 MED ORDER — DEXTROMETHORPHAN-GUAIFENESIN 10-100 MG/5ML PO SYRP: 5.0000 mL | ORAL_SOLUTION | Freq: Two times a day (BID) | ORAL | Status: DC

## 2014-09-11 MED ORDER — CEFEPIME HCL 1 G IJ SOLR
1.0000 g | Freq: Two times a day (BID) | INTRAMUSCULAR | Status: DC
  Administered 2014-09-11 – 2014-09-13 (×4): 1 g via INTRAVENOUS
  Filled 2014-09-11 (×6): qty 1

## 2014-09-11 MED ORDER — PIPERACILLIN-TAZOBACTAM 3.375 G IVPB
3.3750 g | Freq: Three times a day (TID) | INTRAVENOUS | Status: DC
  Administered 2014-09-11 (×2): 3.375 g via INTRAVENOUS
  Filled 2014-09-11 (×4): qty 50

## 2014-09-11 MED ORDER — HYDROCHLOROTHIAZIDE 25 MG PO TABS
25.0000 mg | ORAL_TABLET | Freq: Every day | ORAL | Status: DC
  Administered 2014-09-11 – 2014-09-12 (×2): 25 mg via ORAL
  Filled 2014-09-11 (×2): qty 1

## 2014-09-11 MED ORDER — CITALOPRAM HYDROBROMIDE 40 MG PO TABS
40.0000 mg | ORAL_TABLET | Freq: Every day | ORAL | Status: DC
  Administered 2014-09-11 – 2014-09-12 (×2): 40 mg via ORAL
  Filled 2014-09-11 (×2): qty 1

## 2014-09-11 MED ORDER — IPRATROPIUM-ALBUTEROL 0.5-2.5 (3) MG/3ML IN SOLN
3.0000 mL | Freq: Once | RESPIRATORY_TRACT | Status: DC
  Filled 2014-09-11: qty 3

## 2014-09-11 MED ORDER — IPRATROPIUM-ALBUTEROL 0.5-2.5 (3) MG/3ML IN SOLN
3.0000 mL | RESPIRATORY_TRACT | Status: DC
  Administered 2014-09-11 (×4): 3 mL via RESPIRATORY_TRACT
  Filled 2014-09-11 (×4): qty 3

## 2014-09-11 MED ORDER — POTASSIUM CHLORIDE CRYS ER 20 MEQ PO TBCR
40.0000 meq | EXTENDED_RELEASE_TABLET | Freq: Once | ORAL | Status: AC
  Administered 2014-09-11: 40 meq via ORAL
  Filled 2014-09-11: qty 2

## 2014-09-11 MED ORDER — LISINOPRIL 20 MG PO TABS
20.0000 mg | ORAL_TABLET | Freq: Every day | ORAL | Status: DC
  Administered 2014-09-11 – 2014-09-12 (×2): 20 mg via ORAL
  Filled 2014-09-11 (×2): qty 1

## 2014-09-11 MED ORDER — SODIUM CHLORIDE 0.9 % IJ SOLN
3.0000 mL | Freq: Two times a day (BID) | INTRAMUSCULAR | Status: DC
  Administered 2014-09-11 – 2014-09-17 (×10): 3 mL via INTRAVENOUS

## 2014-09-11 MED ORDER — LISINOPRIL-HYDROCHLOROTHIAZIDE 20-25 MG PO TABS: 1.0000 | ORAL_TABLET | Freq: Every day | ORAL | Status: DC

## 2014-09-11 MED ORDER — BENZONATATE 100 MG PO CAPS
100.0000 mg | ORAL_CAPSULE | Freq: Three times a day (TID) | ORAL | Status: DC
  Administered 2014-09-11 – 2014-09-12 (×4): 100 mg via ORAL
  Filled 2014-09-11 (×7): qty 1

## 2014-09-11 NOTE — ED Provider Notes (Signed)
CSN: [id number]     Arrival date & time 09/11/14  0335 History   First MD Initiated Contact with Patient 09/11/14 [phone]     Chief Complaint  Patient presents with  . Shortness of Breath     (Consider location/radiation/quality/duration/timing/severity/associated sxs/prior Treatment) Patient is a 63 y.o. female presenting with shortness of breath. The history is provided by the patient.  Shortness of Breath She comes in with 2 day history of worsening dyspnea. She has a history of COPD and has been admitted to the hospital about 2 weeks ago and states that she never was quite back to normal. She had been seen in her clinic last week and was given a short course of prednisone which just completed yesterday. She denies fever but has had chills and sweats. There is a nonproductive cough. She denies vomiting or diarrhea. She denies arthralgias or myalgias. Home nebulizers have not been effective.  Past Medical History  Diagnosis Date  . Hyperlipidemia   . Hypertension   . Tobacco abuse   . COPD (chronic obstructive pulmonary disease)     History of multiple hospital admissions for exercabation   . Asthma   . Breast cancer 1991    s/p lumpectomy, chemotherapy and radiation therapy in 1991. Mammogram in 2007 was normal.  . Sigmoid diverticulitis 80/2008  . Anxiety   . Depression   . Obesity   . GERD (gastroesophageal reflux disease)   . Heart murmur 10/05/11    "first time I ever heard I had one was today"  . Pneumonia   . Shortness of breath 10/05/11    "at rest; lying down; w/exertion"  . Diabetes mellitus   . Bronchitis     h/o  . Diarrhea     h/o  . Constipated     h/o  . COPD with exacerbation 04/06/2009    Qualifier: Diagnosis of  By: Eyvonne Mechanic MD, Doyne Keel     Past Surgical History  Procedure Laterality Date  . Dobutamine stress echo  08/2004    Inferior ischemia, normal LV systolic function, no significant CAD  . Abdominal hysterectomy    . Breast surgery  1991   lumphectomy right breast  . Neck surgery  2012    "Dr. Lynann Bologna  put plate in; did something to my vertebrae"   Family History  Problem Relation Age of Onset  . Cancer Mother    History  Substance Use Topics  . Smoking status: Current Some Day Smoker -- 0.20 packs/day for 40 years    Types: Cigarettes  . Smokeless tobacco: Never Used     Comment: slowly quitting.  . Alcohol Use: 2.4 oz/week    4 Cans of beer per week     Comment: "only on the weekends"   OB History    No data available     Review of Systems  Respiratory: Positive for shortness of breath.   All other systems reviewed and are negative.     Allergies  Review of patient's allergies indicates no known allergies.  Home Medications   Prior to Admission medications   Medication Sig Start Date End Date Taking? Authorizing Provider  ACCU-CHEK FASTCLIX LANCETS MISC 1 each by Other route See admin instructions. Check blood sugar daily as needed for high blood sugar.   Yes Historical Provider, MD  albuterol (PROVENTIL) (2.5 MG/3ML) 0.083% nebulizer solution Take 3 mLs (2.5 mg total) by nebulization every 6 (six) hours as needed for wheezing or shortness of breath. 08/07/14  Yes Jacquelyn  Ferd Glassing, MD  albuterol (VENTOLIN HFA) 108 (90 BASE) MCG/ACT inhaler Inhale 2 puffs into the lungs every 6 (six) hours as needed for wheezing or shortness of breath. 08/07/14  Yes Jones Bales, MD  Blood Glucose Monitoring Suppl (ACCU-CHEK NANO SMARTVIEW) W/DEVICE KIT 1 each by Other route See admin instructions. Check blood sugar daily as needed for high blood sugar.   Yes Historical Provider, MD  budesonide-formoterol (SYMBICORT) 80-4.5 MCG/ACT inhaler Inhale 2 puffs into the lungs 2 (two) times daily as needed. For asthma Patient taking differently: Inhale 2 puffs into the lungs 2 (two) times daily. For asthma 08/13/14  Yes Drucilla Schmidt, MD  carvedilol (COREG) 6.25 MG tablet Take 6.25 mg by mouth 2 (two) times daily with a meal.   Yes  Historical Provider, MD  citalopram (CELEXA) 40 MG tablet Take 40 mg by mouth daily.   Yes Historical Provider, MD  fluticasone (FLONASE) 50 MCG/ACT nasal spray Place 1 spray into both nostrils daily as needed for allergies. Patient taking differently: Place 1 spray into both nostrils daily.  08/13/14  Yes Blain Pais, MD  glucose blood (ACCU-CHEK SMARTVIEW) test strip 1 each by Other route See admin instructions. Check blood sugar daily as needed for high blood sugar.   Yes Historical Provider, MD  ipratropium-albuterol (DUONEB) 0.5-2.5 (3) MG/3ML SOLN Take 3 mLs by nebulization every 4 (four) hours as needed. Patient taking differently: Take 3 mLs by nebulization every 4 (four) hours as needed (shortness of breath).  09/03/14  Yes Jones Bales, MD  lisinopril-hydrochlorothiazide (PRINZIDE,ZESTORETIC) 20-25 MG per tablet Take 1 tablet by mouth daily.   Yes Historical Provider, MD  metFORMIN (GLUCOPHAGE) 1000 MG tablet Take 1 tablet (1,000 mg total) by mouth 2 (two) times daily with a meal. 09/03/14  Yes Jones Bales, MD  pravastatin (PRAVACHOL) 40 MG tablet Take 40 mg by mouth daily.   Yes Historical Provider, MD  sodium chloride (OCEAN) 0.65 % SOLN nasal spray Place 1 spray into both nostrils as needed for congestion. Patient taking differently: Place 1 spray into both nostrils daily as needed for congestion.  08/08/14  Yes Otho Bellows, MD  benzonatate (TESSALON) 100 MG capsule Take 1 capsule (100 mg total) by mouth 3 (three) times daily. 08/13/14   Blain Pais, MD  cyclobenzaprine (FLEXERIL) 5 MG tablet TAKE ONE TABLET BY MOUTH AT BEDTIME AS NEEDED FOR MUSCLE SPASMS 09/08/14   Jones Bales, MD  Dextromethorphan-Guaifenesin [facility] SUGAR FREE) 10-100 MG/5ML liquid Take 5 mLs by mouth every 12 (twelve) hours. 08/13/14   Blain Pais, MD  sulfamethoxazole-trimethoprim (BACTRIM DS) 800-160 MG per tablet Take 1 tablet by mouth 2 (two) times daily. 08/13/14   Blain Pais, MD  tiotropium (SPIRIVA) 18 MCG inhalation capsule Place 18 mcg into inhaler and inhale daily.    Historical Provider, MD   BP 142/58 mmHg  Pulse 90  Temp(Src) 98 F (36.7 C) (Oral)  Resp 15  Ht _0  (1.702 m)  Wt 212 lb (96.163 kg)  BMI 33.20 kg/m2  SpO2 96% Physical Exam  Nursing note and vitals reviewed.  63 year old female, who appears somewhat dyspneic, but is in no acute distress. Vital signs are significant for borderline hypertension. Oxygen saturation is 96%, which is normal.she is not using accessory muscles of respiration, but she is not able to complete a sentence without stopping to get another breath. Head is normocephalic and atraumatic. PERRLA, EOMI. Oropharynx is clear. Neck is nontender  and supple without adenopathy or JVD. Back is nontender and there is no CVA tenderness. Lungs have diffuse inspiratory and expiratory wheezes. There are no rales or rhonchi.there is questionable mild stridor. Chest is nontender. Heart has regular rate and rhythm without murmur. Abdomen is soft, flat, nontender without masses or hepatosplenomegaly and peristalsis is normoactive. Extremities have no cyanosis or edema, full range of motion is present. Skin is warm and dry without rash. Neurologic: Mental status is normal, cranial nerves are intact, there are no motor or sensory deficits.  ED Course  Procedures (including critical care time) Labs Review Results for orders placed or performed during the hospital encounter of 86/57/84  Basic metabolic panel    (if pt has PMH of COPD)  Result Value Ref Range   Sodium 138 137 - 147 mEq/L   Potassium 3.5 (L) 3.7 - 5.3 mEq/L   Chloride 98 96 - 112 mEq/L   CO2 25 19 - 32 mEq/L   Glucose, Bld 139 (H) 70 - 99 mg/dL   BUN 11 6 - 23 mg/dL   Creatinine, Ser 0.82 0.50 - 1.10 mg/dL   Calcium 8.8 8.4 - 10.5 mg/dL   GFR calc non Af Amer 75 (L) >90 mL/min   GFR calc Af Amer 86 (L) >90 mL/min   Anion gap 15 5 - 15  CBC     (if pt  has PMH of COPD)  Result Value Ref Range   WBC 8.3 4.0 - 10.5 K/uL   RBC 4.13 3.87 - 5.11 MIL/uL   Hemoglobin 12.1 12.0 - 15.0 g/dL   HCT 35.6 (L) 36.0 - 46.0 %   MCV 86.2 78.0 - 100.0 fL   MCH 29.3 26.0 - 34.0 pg   MCHC 34.0 30.0 - 36.0 g/dL   RDW 13.2 11.5 - 15.5 %   Platelets 315 150 - 400 K/uL  I-stat troponin, ED (if patient has history of COPD)  Result Value Ref Range   Troponin i, poc 0.02 0.00 - 0.08 ng/mL   Comment 3           Imaging Review Dg Chest 2 View (if Patient Has Fever And/or Copd)  09/11/2014   CLINICAL DATA:  Shortness of breath for several days.  EXAM: CHEST  2 VIEW  COMPARISON:  09/03/2014  FINDINGS: Normal heart size and pulmonary vascularity. Emphysematous changes and slight fibrosis in the lungs. No focal airspace disease or consolidation. No blunting of costophrenic angles. No pneumothorax. Postoperative changes in the cervical spine. Surgical clips in the right axilla.  IMPRESSION: No active cardiopulmonary disease.  Emphysematous changes.   Electronically Signed   By: Lucienne Capers M.D.   On: 09/11/2014 04:56   Images viewed by me.   EKG Interpretation   Date/Time:  Wednesday September 11 2014 03:43:11 EST Ventricular Rate:  100 PR Interval:  124 QRS Duration: 84 QT Interval:  362 QTC Calculation: 466 R Axis:   90 Text Interpretation:  Normal sinus rhythm Rightward axis Nonspecific ST  abnormality Abnormal ECG When compared with ECG of 08/13/2014, No  significant change was found Confirmed by [facility]  MD, Deyona Soza ([id number]) on  09/11/2014 4:25:54 AM      CRITICAL CARE Performed by: BMWUX,[email or web address] Total critical care time: 35 minutes Critical care time was exclusive of separately billable procedures and treating other patients. Critical care was necessary to treat or prevent imminent or life-threatening deterioration. Critical care was time spent personally by me on the following activities: development of treatment plan with patient  and/or surrogate as  well as nursing, discussions with consultants, evaluation of patient's response to treatment, examination of patient, obtaining history from patient or surrogate, ordering and performing treatments and interventions, ordering and review of laboratory studies, ordering and review of radiographic studies, pulse oximetry and re-evaluation of patient's condition.  MDM   Final diagnoses:  COPD exacerbation    Exacerbation of COPD. She has received an albuterol with ipratropium nebulizer treatment in the ED with no benefit. This will be repeated but I suspect that she will need readmission. She is given a dose of methylprednisolone.   She is not responded to additional nebulizer treatments. She continues to be somewhat breathless and not be able to complete sentences. Case is discussed with Dr. Ronnald Ramp of internal medicine service who agrees to admit the patient.  Delora Fuel, MD 27/06/23 [id number]

## 2014-09-11 NOTE — Progress Notes (Addendum)
Subjective:  Patient was seen and examined this morning. Patient is a 63 yo female with PMHx of COPD, Type II DM, tobacco abuse, HTN, and HLD who presented with dyspnea and a productive cough. Patient states that feels short of breath, continues to have a productive cough, headache, nasal congestion and dry mouth and throat. Patient was discharged at the beginning of October for a COPD exacerbation and states she completed her course of levaquin and steroid taper. Her grandson has been sick recently and she believes she picked this up from him. She also states she has cut down smoking to 2 cigarettes per day and wants to quit but has not received her nicotine patches.  Objective: Vital signs in last 24 hours: Filed Vitals:   09/11/14 0720 09/11/14 0921 09/11/14 1137 09/11/14 1401  BP: 174/84  157/62 172/143  Pulse: 108  111 115  Temp: 98.6 F (37 C)   98.6 F (37 C)  TempSrc: Oral   Oral  Resp: _0 Height:      Weight:      SpO2: 96% 100%  98%   Weight change:   Intake/Output Summary (Last 24 hours) at 09/11/14 1522 Last data filed at 09/11/14 1401  Gross per 24 hour  Intake    680 ml  Output   2575 ml  Net  -1895 ml   General: Vital signs reviewed.  Patient is well-developed and well-nourished, in no mild distress and cooperative with exam.  Cardiovascular: Tachycardic, regular rhythm, S1 normal, S2 normal, no murmurs, gallops, or rubs. Pulmonary/Chest: Diffuse inspiratory wheezes and expiratory rhonchi. Patient must pause after a sentence to catch her breath. Abdominal: Soft, obese, non-tender, non-distended, BS +, no masses, organomegaly, or guarding present.  Musculoskeletal: No joint deformities, erythema, or stiffness, ROM full and nontender. Extremities: No lower extremity edema bilaterally,  pulses symmetric and intact bilaterally. No cyanosis or clubbing. Neurological: A&O x3. Skin: Warm, dry and intact. No rashes or erythema. Psychiatric: Normal mood and  affect. Speech and behavior is normal. Cognition and memory are normal.   Lab Results: Basic Metabolic Panel:  Recent Labs Lab 09/11/14 0400  NA 138  K 3.5*  CL 98  CO2 25  GLUCOSE 139*  BUN 11  CREATININE 0.82  CALCIUM 8.8   CBC:  Recent Labs Lab 09/11/14 0400  WBC 8.3  HGB 12.1  HCT 35.6*  MCV 86.2  PLT 315   Urine Drug Screen: Drugs of Abuse     Component Value Date/Time   LABOPIA NONE DETECTED 12/29/2009 0633   COCAINSCRNUR NONE DETECTED 12/29/2009 0633   LABBENZ NONE DETECTED 12/29/2009 0633   AMPHETMU NONE DETECTED 12/29/2009 [place] DETECTED 12/29/2009 0633   LABBARB  12/29/2009 0633    NONE DETECTED        DRUG SCREEN FOR MEDICAL PURPOSES ONLY.  IF CONFIRMATION IS NEEDED FOR ANY PURPOSE, NOTIFY LAB WITHIN 5 DAYS.        LOWEST DETECTABLE LIMITS FOR URINE DRUG SCREEN Drug Class       Cutoff (ng/mL) Amphetamine      1000 Barbiturate      200 Benzodiazepine   938 Tricyclics       101 Opiates          300 Cocaine          300 THC              50    Studies/Results: Dg Chest 2 View (if  Patient Has Fever And/or Copd)  09/11/2014   CLINICAL DATA:  Shortness of breath for several days.  EXAM: CHEST  2 VIEW  COMPARISON:  09/03/2014  FINDINGS: Normal heart size and pulmonary vascularity. Emphysematous changes and slight fibrosis in the lungs. No focal airspace disease or consolidation. No blunting of costophrenic angles. No pneumothorax. Postoperative changes in the cervical spine. Surgical clips in the right axilla.  IMPRESSION: No active cardiopulmonary disease.  Emphysematous changes.   Electronically Signed   By: Lucienne Capers M.D.   On: 09/11/2014 04:56   Medications:  I have reviewed the patient's current medications. Prior to Admission:  Prescriptions prior to admission  Medication Sig Dispense Refill Last Dose  . ACCU-CHEK FASTCLIX LANCETS MISC 1 each by Other route See admin instructions. Check blood sugar daily as needed for high  blood sugar.   09/10/2014 at Unknown time  . albuterol (PROVENTIL) (2.5 MG/3ML) 0.083% nebulizer solution Take 3 mLs (2.5 mg total) by nebulization every 6 (six) hours as needed for wheezing or shortness of breath. 1 vial 0 09/11/2014 at Unknown time  . albuterol (VENTOLIN HFA) 108 (90 BASE) MCG/ACT inhaler Inhale 2 puffs into the lungs every 6 (six) hours as needed for wheezing or shortness of breath. 1 Inhaler 3 09/11/2014 at Unknown time  . Blood Glucose Monitoring Suppl (ACCU-CHEK NANO SMARTVIEW) W/DEVICE KIT 1 each by Other route See admin instructions. Check blood sugar daily as needed for high blood sugar.   09/10/2014 at Unknown time  . budesonide-formoterol (SYMBICORT) 80-4.5 MCG/ACT inhaler Inhale 2 puffs into the lungs 2 (two) times daily as needed. For asthma (Patient taking differently: Inhale 2 puffs into the lungs 2 (two) times daily. For asthma) 1 Inhaler 12 09/10/2014 at Unknown time  . carvedilol (COREG) 6.25 MG tablet Take 6.25 mg by mouth 2 (two) times daily with a meal.   09/10/2014 at 2100  . citalopram (CELEXA) 40 MG tablet Take 40 mg by mouth daily.   09/10/2014 at Unknown time  . fluticasone (FLONASE) 50 MCG/ACT nasal spray Place 1 spray into both nostrils daily as needed for allergies. (Patient taking differently: Place 1 spray into both nostrils daily. ) 16 g 2 09/10/2014 at Unknown time  . glucose blood (ACCU-CHEK SMARTVIEW) test strip 1 each by Other route See admin instructions. Check blood sugar daily as needed for high blood sugar.   09/10/2014 at Unknown time  . ipratropium-albuterol (DUONEB) 0.5-2.5 (3) MG/3ML SOLN Take 3 mLs by nebulization every 4 (four) hours as needed. (Patient taking differently: Take 3 mLs by nebulization every 4 (four) hours as needed (shortness of breath). ) 360 mL 0 09/11/2014 at Unknown time  . lisinopril-hydrochlorothiazide (PRINZIDE,ZESTORETIC) 20-25 MG per tablet Take 1 tablet by mouth daily.   09/10/2014 at Unknown time  . metFORMIN (GLUCOPHAGE) 1000  MG tablet Take 1 tablet (1,000 mg total) by mouth 2 (two) times daily with a meal. 60 tablet 1 09/10/2014 at Unknown time  . pravastatin (PRAVACHOL) 40 MG tablet Take 40 mg by mouth daily.   09/10/2014 at Unknown time  . sodium chloride (OCEAN) 0.65 % SOLN nasal spray Place 1 spray into both nostrils as needed for congestion. (Patient taking differently: Place 1 spray into both nostrils daily as needed for congestion. ) 15 mL 3 09/10/2014 at Unknown time  . benzonatate (TESSALON) 100 MG capsule Take 1 capsule (100 mg total) by mouth 3 (three) times daily. 20 capsule 0   . cyclobenzaprine (FLEXERIL) 5 MG tablet TAKE ONE TABLET  BY MOUTH AT BEDTIME AS NEEDED FOR MUSCLE SPASMS 30 tablet 0   . Dextromethorphan-Guaifenesin (ROBITUSSIN SUGAR FREE) 10-100 MG/5ML liquid Take 5 mLs by mouth every 12 (twelve) hours. 120 mL 2   . sulfamethoxazole-trimethoprim (BACTRIM DS) 800-160 MG per tablet Take 1 tablet by mouth 2 (two) times daily. 10 tablet 0   . tiotropium (SPIRIVA) 18 MCG inhalation capsule Place 18 mcg into inhaler and inhale daily.   08/12/2014 at Unknown time   Scheduled Meds: . benzonatate  100 mg Oral TID  . budesonide-formoterol  2 puff Inhalation BID  . carvedilol  6.25 mg Oral BID WC  . ceFEPime (MAXIPIME) IV  1 g Intravenous Q12H  . citalopram  40 mg Oral Daily  . guaiFENesin-dextromethorphan  5 mL Oral Q12H  . heparin  5,000 Units Subcutaneous 3 times per day  . lisinopril  20 mg Oral Daily   And  . hydrochlorothiazide  25 mg Oral Daily  . insulin aspart  0-9 Units Subcutaneous TID WC  . ipratropium-albuterol  3 mL Nebulization Q4H  . nicotine  7 mg Transdermal Daily  . pravastatin  40 mg Oral q1800  . [START ON 09/12/2014] predniSONE  50 mg Oral Q breakfast  . sodium chloride  3 mL Intravenous Q12H  . sodium chloride  3 mL Intravenous Q12H   Continuous Infusions:  PRN Meds:.albuterol, fluticasone Assessment/Plan: Active Problems:   COPD exacerbation  COPD exacerbation: Patient  continues to complain of shortness of breath and a productive cough along with nasal congestion and thick mucus. Patient received 125 mg Solu-Medrol and DuoNeb treatments in the ED. Patient is satting 97% on room air, but must pause during interview to catch her breath. Patient has rhonchi and wheezes on exam. -DuoNeb nebulizer Q4H -Start prednisone 50 mg daily tomorrow for 4 days -Albuterol nebs Q2H prn -Tessalon 100 mg TID -Symbicort (budesonide-formoterol) BID -Robitussin BID -Transition to Cefepime today and then transition to levaquin on discharge  Hypokalemia: Potassium 3.5. -KDur 40 mEq once -BMET tomorrow am  Headache and MSK chest pain: Patient complains of headache and chest soreness from coughing. -Toradol 15 mg IV once  HTN: Hypertensive 614E-315Q systolic. Patient is on lisinopril-hydrochlorothiazide 20-15 mg daily and Coreg 6.25 mg BID. -Continue home lisinopril-hydrochlorothiazide 20-15 mg daily  -Continue Coreg 6.25 mg twice a day -Will increase if necessary  DM Type II: HgA1c 6.8% on 06/14/2014. On metformin 500 mg BID at home.  -SSI sensitive -CBG monitoring  -Carb modified diet  Depression and Anxiety: Admits to some anxiety surrounding shortness of breath. Patient is on Celexa 40 mg daily at home. -Continue home Celexa 40 mg daily -Ativan 0.5 mg IV once  Tobacco Use: 10 pack year history with current ongoing use of 2 cigarretes per day. Patient wants to quit, planning to do so with husband, but she has not received her nicotine patches for insurance reasons.   DVT/PE ppx: Heparin SQ TID  Dispo: Disposition is deferred at this time, awaiting improvement of current medical problems.  Anticipated discharge in approximately 1-2 day(s).   The patient does have a current PCP (Jones Bales, MD) and does need an [facility] hospital follow-up appointment after discharge.  The patient does not have transportation limitations that hinder transportation to clinic  appointments.  .Services Needed at time of discharge: Y = Yes, Blank = No PT:   OT:   RN:   Equipment:   Other:     LOS: 0 days   Osa Craver, DO PGY-1 Internal  Medicine Resident Pager # [phone] 09/11/2014 3:22 PM

## 2014-09-11 NOTE — Progress Notes (Signed)
UR completed Byron Peacock K. Shailene Demonbreun, RN, BSN, MSHL, CCM  09/11/2014 11:56 AM

## 2014-09-11 NOTE — ED Notes (Signed)
Pt here for sob, onset several days ago, has seen pmd for same and informed to come back if no improvement, pt with wheezing noted.

## 2014-09-11 NOTE — Progress Notes (Signed)
1330 Pt  complained of unable to breath O2 sat on 2lL/min .respiratory  Treatment rendered  Pt anxious also Placed a call and  Spoken to MD with ordesr

## 2014-09-11 NOTE — Progress Notes (Signed)
Pharmacy note: Zosyn  63 yo female here with SOB and COPD exacerbation and previous treatment with levaquin and doxycycline. Pharmacy has been consulted to dose zosyn. WBC= 8.3, afebrile, SCr= 0.82 and CrCl ~ 85.  11/4 zosyn>>  Plan -Zosyn 3.375gm IV q8h -Will sign off for now, please call pharmacy with any other needs  Hildred Laser, Pharm D 09/11/2014 11:18 AM

## 2014-09-11 NOTE — H&P (Signed)
Date: 09/11/2014               Patient Name:  [name] MRN: [id number]  DOB: [birth year] Age / Sex: 63 y.o., female   PCP: Jones Bales, MD         Medical Service: Internal Medicine Teaching Service         Attending Physician: Dr. [name], MD    First Contact: Dr. Marvel Plan Pager: [phone]  Second Contact: Dr. Ronnald Ramp Pager: [phone]       After Hours (After 5p/  First Contact Pager: [phone]  weekends / holidays): Second Contact Pager: [phone]   Chief Complaint: dyspnea  History of Present Illness:   [name] is a 63 yo woman with a history of COPD, tobacco abuse, type 2 diabetes, depression, and hypertension who presents with dyspnea and productive cough. Patient reports that she has had persistent issues with dyspnea and cough ever since her last hospitalization. She states that she has been compliant with all of her medications including her steroid tapers and completed her course of levofloxacin. However, she states that she really has never felt quite back to her baseline throughout the last couple weeks. She has had persistent dyspnea and her cough has still been consistently productive of yellow sputum. She was seen by Dr. Gordy Levan last week in clinic. At that point, she had finished her steroid taper and regimen of Levaquin. Ipratropium nebulizer was added at that point, she was instructed to return to clinic if not improved. Otherwise, she denies any abdominal pain, constipation, diarrhea, nausea, or vomiting. Patient does report some chest wall pain that is associated with her coughing. She states that she is still smoking around 2 cigarettes a day, and has had issues with insurance paying for her routine patches at home.  During her last hospitalization from 08/12/2014 to 08/13/2014, she states that she felt like she was rushed out of the hospital before she is ready to go home. She states that she felt exactly the same at admission as she did at  discharge. She also stated that she was quite upset by some of the perceived behavior of medical students involved in her care last time. She requests that no medical students be involved in her care during this hospitalization. She was quite tearful in describing her frustrations with her last hospital admission.  In the emergency department, received DuoNeb nebulizers, 125 mg of Solu-Medrol, and 4 mg of morphine. Patient states that her breathing is a little better, now that she is able to speak in full sentences.   Past Medical History  Diagnosis Date  . Hyperlipidemia   . Hypertension   . Tobacco abuse   . COPD (chronic obstructive pulmonary disease)     History of multiple hospital admissions for exercabation   . Asthma   . Breast cancer 1991    s/p lumpectomy, chemotherapy and radiation therapy in 1991. Mammogram in 2007 was normal.  . Sigmoid diverticulitis 80/2008  . Anxiety   . Depression   . Obesity   . GERD (gastroesophageal reflux disease)   . Heart murmur 10/05/11    "first time I ever heard I had one was today"  . Pneumonia   . Shortness of breath 10/05/11    "at rest; lying down; w/exertion"  . Diabetes mellitus   . Bronchitis     h/o  . Diarrhea     h/o  . Constipated     h/o  . COPD with  exacerbation 04/06/2009    Qualifier: Diagnosis of  By: Eyvonne Mechanic MD, Vijay     Past Surgical History  Procedure Laterality Date  . Dobutamine stress echo  08/2004    Inferior ischemia, normal LV systolic function, no significant CAD  . Abdominal hysterectomy    . Breast surgery  1991    lumphectomy right breast  . Neck surgery  2012    "Dr. Lynann Bologna  put plate in; did something to my vertebrae"    Meds: Current Facility-Administered Medications  Medication Dose Route Frequency Provider Last Rate Last Dose  . ipratropium-albuterol (DUONEB) 0.5-2.5 (3) MG/3ML nebulizer solution 3 mL  3 mL Nebulization Once Delora Fuel, MD       Current Outpatient Prescriptions    Medication Sig Dispense Refill  . ACCU-CHEK FASTCLIX LANCETS MISC 1 each by Other route See admin instructions. Check blood sugar daily as needed for high blood sugar.    . albuterol (PROVENTIL) (2.5 MG/3ML) 0.083% nebulizer solution Take 3 mLs (2.5 mg total) by nebulization every 6 (six) hours as needed for wheezing or shortness of breath. 1 vial 0  . albuterol (VENTOLIN HFA) 108 (90 BASE) MCG/ACT inhaler Inhale 2 puffs into the lungs every 6 (six) hours as needed for wheezing or shortness of breath. 1 Inhaler 3  . Blood Glucose Monitoring Suppl (ACCU-CHEK NANO SMARTVIEW) W/DEVICE KIT 1 each by Other route See admin instructions. Check blood sugar daily as needed for high blood sugar.    . budesonide-formoterol (SYMBICORT) 80-4.5 MCG/ACT inhaler Inhale 2 puffs into the lungs 2 (two) times daily as needed. For asthma (Patient taking differently: Inhale 2 puffs into the lungs 2 (two) times daily. For asthma) 1 Inhaler 12  . carvedilol (COREG) 6.25 MG tablet Take 6.25 mg by mouth 2 (two) times daily with a meal.    . citalopram (CELEXA) 40 MG tablet Take 40 mg by mouth daily.    . fluticasone (FLONASE) 50 MCG/ACT nasal spray Place 1 spray into both nostrils daily as needed for allergies. (Patient taking differently: Place 1 spray into both nostrils daily. ) 16 g 2  . glucose blood (ACCU-CHEK SMARTVIEW) test strip 1 each by Other route See admin instructions. Check blood sugar daily as needed for high blood sugar.    . ipratropium-albuterol (DUONEB) 0.5-2.5 (3) MG/3ML SOLN Take 3 mLs by nebulization every 4 (four) hours as needed. (Patient taking differently: Take 3 mLs by nebulization every 4 (four) hours as needed (shortness of breath). ) 360 mL 0  . lisinopril-hydrochlorothiazide (PRINZIDE,ZESTORETIC) 20-25 MG per tablet Take 1 tablet by mouth daily.    . metFORMIN (GLUCOPHAGE) 1000 MG tablet Take 1 tablet (1,000 mg total) by mouth 2 (two) times daily with a meal. 60 tablet 1  . pravastatin  (PRAVACHOL) 40 MG tablet Take 40 mg by mouth daily.    . sodium chloride (OCEAN) 0.65 % SOLN nasal spray Place 1 spray into both nostrils as needed for congestion. (Patient taking differently: Place 1 spray into both nostrils daily as needed for congestion. ) 15 mL 3  . benzonatate (TESSALON) 100 MG capsule Take 1 capsule (100 mg total) by mouth 3 (three) times daily. 20 capsule 0  . cyclobenzaprine (FLEXERIL) 5 MG tablet TAKE ONE TABLET BY MOUTH AT BEDTIME AS NEEDED FOR MUSCLE SPASMS 30 tablet 0  . Dextromethorphan-Guaifenesin (ROBITUSSIN SUGAR FREE) 10-100 MG/5ML liquid Take 5 mLs by mouth every 12 (twelve) hours. 120 mL 2  . sulfamethoxazole-trimethoprim (BACTRIM DS) 800-160 MG per tablet Take 1  tablet by mouth 2 (two) times daily. 10 tablet 0  . tiotropium (SPIRIVA) 18 MCG inhalation capsule Place 18 mcg into inhaler and inhale daily.    . [DISCONTINUED] albuterol (PROVENTIL,VENTOLIN) 90 MCG/ACT inhaler Inhale 2 puffs into the lungs every 6 (six) hours as needed for wheezing. 17 g 12    Allergies: Allergies as of 09/11/2014  . (No Known Allergies)   Family History  Problem Relation Age of Onset  . Cancer Mother    History   Social History  . Marital Status: Married    Spouse Name: N/A    Number of Children: N/A  . Years of Education: N/A   Occupational History  . Not on file.   Social History Main Topics  . Smoking status: Current Some Day Smoker -- 0.20 packs/day for 40 years    Types: Cigarettes  . Smokeless tobacco: Never Used     Comment: slowly quitting.  . Alcohol Use: 2.4 oz/week    4 Cans of beer per week     Comment: "only on the weekends"  . Drug Use: No  . Sexual Activity: Not on file   Other Topics Concern  . Not on file   Social History Narrative   Lives in [place] with her husband.   Takes care of 3 grand children.   Trying to find a job, has financial difficulties.          Review of Systems: All pertinent ROS has stated in HPI.   Physical  Exam: Blood pressure 158/82, pulse 98, temperature 98 F (36.7 C), temperature source Oral, resp. rate 18, height '5\' 7"'  (1.702 m), weight 212 lb (96.163 kg), SpO2 99 %. General: sitting upright in no acute distress, tearful throughout interview HEENT: PERRL, EOMI, no scleral icterus, nasal cannula in place Cardiac: RRR, no rubs, murmurs or gallops Pulm: moderate expiratory wheezes throughout bilateral lung fields Abd: soft, nontender, nondistended, BS present Ext: warm and well perfused, no pedal edema Neuro: alert and oriented X3, cranial nerves II-XII grossly intact Skin: no rashes or lesions noted Psych: tearful and frustrated  Lab results: Basic Metabolic Panel:  Recent Labs  09/11/14 0400  NA 138  K 3.5*  CL 98  CO2 25  GLUCOSE 139*  BUN 11  CREATININE 0.82  CALCIUM 8.8   Liver Function Tests: No results for input(s): AST, ALT, ALKPHOS, BILITOT, PROT, ALBUMIN in the last 72 hours. No results for input(s): LIPASE, AMYLASE in the last 72 hours. No results for input(s): AMMONIA in the last 72 hours. CBC:  Recent Labs  09/11/14 0400  WBC 8.3  HGB 12.1  HCT 35.6*  MCV 86.2  PLT 315   Cardiac Enzymes: No results for input(s): CKTOTAL, CKMB, CKMBINDEX, TROPONINI in the last 72 hours. BNP: No results for input(s): PROBNP in the last 72 hours. D-Dimer: No results for input(s): DDIMER in the last 72 hours. CBG: No results for input(s): GLUCAP in the last 72 hours. Hemoglobin A1C: No results for input(s): HGBA1C in the last 72 hours. Fasting Lipid Panel: No results for input(s): CHOL, HDL, LDLCALC, TRIG, CHOLHDL, LDLDIRECT in the last 72 hours. Thyroid Function Tests: No results for input(s): TSH, T4TOTAL, FREET4, T3FREE, THYROIDAB in the last 72 hours. Anemia Panel: No results for input(s): VITAMINB12, FOLATE, FERRITIN, TIBC, IRON, RETICCTPCT in the last 72 hours. Coagulation: No results for input(s): LABPROT, INR in the last 72 hours. Urine Drug Screen: Drugs  of Abuse     Component Value Date/Time   LABOPIA  NONE DETECTED 12/29/2009 [place] DETECTED 12/29/2009 0633   LABBENZ NONE DETECTED 12/29/2009 0633   AMPHETMU NONE DETECTED 12/29/2009 [place] 12/29/2009 0633   LABBARB  12/29/2009 5643    NONE DETECTED        DRUG SCREEN FOR MEDICAL PURPOSES ONLY.  IF CONFIRMATION IS NEEDED FOR ANY PURPOSE, NOTIFY LAB WITHIN 5 DAYS.        LOWEST DETECTABLE LIMITS FOR URINE DRUG SCREEN Drug Class       Cutoff (ng/mL) Amphetamine      1000 Barbiturate      200 Benzodiazepine   329 Tricyclics       518 Opiates          300 Cocaine          300 THC              50    Alcohol Level: No results for input(s): ETH in the last 72 hours. Urinalysis: No results for input(s): COLORURINE, LABSPEC, PHURINE, GLUCOSEU, HGBUR, BILIRUBINUR, KETONESUR, PROTEINUR, UROBILINOGEN, NITRITE, LEUKOCYTESUR in the last 72 hours.  Invalid input(s): APPERANCEUR  Imaging results:  Dg Chest 2 View (if Patient Has Fever And/or Copd)  09/11/2014   CLINICAL DATA:  Shortness of breath for several days.  EXAM: CHEST  2 VIEW  COMPARISON:  09/03/2014  FINDINGS: Normal heart size and pulmonary vascularity. Emphysematous changes and slight fibrosis in the lungs. No focal airspace disease or consolidation. No blunting of costophrenic angles. No pneumothorax. Postoperative changes in the cervical spine. Surgical clips in the right axilla.  IMPRESSION: No active cardiopulmonary disease.  Emphysematous changes.   Electronically Signed   By: Lucienne Capers M.D.   On: 09/11/2014 04:56    Other results: EKG: normal sinus rhythm, no ST-T changes compared to prior.   Assessment & Plan by Problem: Active Problems:   COPD exacerbation  Ms. Allbaugh is a 63 yo woman with a history of COPD, tobacco abuse, type 2 diabetes, depression, and hypertension who presents with COPD exacerbation.  COPD exacerbation: Patient presenting with persistently high  frequency and severity of cough, sputum production yellow in color, and dyspnea. Patient does not seem to have any PFTs on file.  Patient is still a smoker. Acute CHF and pneumonia are less likely since chest x-ray shows no evidence of consolidation or edema, no leukocytosis. EKG not concerning for ischemia, and troponin negative x 1. Patient has received 125 mg Solu-Medrol and DuoNeb treatments in the ED with mild resolution of initial presenting symptoms. Severity is mild at this point as the patient is satting 99% on 2L with no change in mental status and no cardiac dysrhythmia. Patient has had previous treatments with doxycycline and levofloxacin with persistent symptoms. According to notes from last hospitalization, there is some concern for medication noncompliance, however patient reports that she has been compliant with her medications since her discharge.  - continue DuoNeb nebulizer every 4 hours  - Zosyn given recent contact with healthcare system prior treatment failures with doxycycline and levofloxacin.  - Patient very recently received 125 mg of Solu-Medrol. Consider weaning to 60 mg every 6 hours or oral prednisone.  HTN: Patient has been hypertensive since evaluation in the emergency department with systolics in the 841Y to 606T.   - Continue home lisinopril-hydrochlorothiazide 20-15 mg daily and Coreg 6.25 mg twice a day  DMII: well-controlled with last HgA1c 6.8% on 06/14/2014. On metformin 500 mg BID at home.   -  SSI in the hospital after solumedrol dose; she then normalized.  Depression and Anxiety: Issues with some anxiety about this being discharged before she is ready. Also some anxiety with interactions with medical students.  - Continue home Celexa 40.   - would plan to respect patient's wishes and not involve medical students.  Tobacco Use: 10 pack year history with current ongoing use. Patient is trying to quit but has had issues with insurance paying for Chantix.  Diet:  Carb modified Prophylaxis: heparin Code: Full  Dispo: Disposition is deferred at this time, awaiting improvement of current medical problems. Anticipated discharge in approximately 3 day(s).   The patient does have a current PCP (Jones Bales, MD) and does need an [facility] hospital follow-up appointment after discharge.  The patient does not have transportation limitations that hinder transportation to clinic appointments.  Signed: Luan Moore, M.D., Ph.D. Internal Medicine Teaching Service, PGY-1 09/11/2014, 5:44 AM

## 2014-09-11 NOTE — Care Management Note (Addendum)
  Page 1 of 1   09/11/2014     12:01:04 PM CARE MANAGEMENT NOTE 09/11/2014  Patient:  [facility]   Account Number:  [id number]  Date Initiated:  09/11/2014  Documentation initiated by:  Cowan Pilar  Subjective/Objective Assessment:   COPD     Action/Plan:   CM to follow for disposition needs   Anticipated DC Date:  09/12/2014   Anticipated DC Plan:  HOME/SELF CARE         Choice offered to / List presented to:             Status of service:  Completed, signed off Medicare Important Message given?   (If response is "NO", the following Medicare IM given date fields will be blank) Date Medicare IM given:   Medicare IM given by:   Date Additional Medicare IM given:   Additional Medicare IM given by:    Discharge Disposition:  HOME/SELF CARE  Per UR Regulation:  Reviewed for med. necessity/level of care/duration of stay  If discussed at [place] of Stay Meetings, dates discussed:    Comments:  Diogenes Whirley RN, BSN, MSHL, CCM  Nurse - Case Manager,  (Unit [facility])  [phone]  09/11/2014 Hx/o 3 admissions and 2 ER visits over past 6 months. ADM:  COPD Social:  From home with spouse. Smoker:  yes THN:  active and gives handoff that patient self manages her care and in denial re:  smoking contributes to poor health and believes causes r/t medications. Dispo Plan:  Home / [place] CM will continue to monitor for needs as indicated.

## 2014-09-12 ENCOUNTER — Inpatient Hospital Stay (HOSPITAL_COMMUNITY): Payer: Medicare Other

## 2014-09-12 ENCOUNTER — Encounter (HOSPITAL_COMMUNITY): Payer: Self-pay | Admitting: *Deleted

## 2014-09-12 DIAGNOSIS — J9601 Acute respiratory failure with hypoxia: Secondary | ICD-10-CM | POA: Insufficient documentation

## 2014-09-12 DIAGNOSIS — R51 Headache: Secondary | ICD-10-CM

## 2014-09-12 DIAGNOSIS — E876 Hypokalemia: Secondary | ICD-10-CM

## 2014-09-12 DIAGNOSIS — Z978 Presence of other specified devices: Secondary | ICD-10-CM | POA: Insufficient documentation

## 2014-09-12 DIAGNOSIS — J9602 Acute respiratory failure with hypercapnia: Secondary | ICD-10-CM

## 2014-09-12 DIAGNOSIS — J9600 Acute respiratory failure, unspecified whether with hypoxia or hypercapnia: Secondary | ICD-10-CM | POA: Insufficient documentation

## 2014-09-12 LAB — PROCALCITONIN: PROCALCITONIN: 0.39 ng/mL

## 2014-09-12 LAB — BLOOD GAS, ARTERIAL
Acid-Base Excess: 5.8 mmol/L — ABNORMAL HIGH (ref 0.0–2.0)
BICARBONATE: 31.2 meq/L — AB (ref 20.0–24.0)
Drawn by: 30599
O2 Content: 3 L/min
O2 Saturation: 96.6 %
PH ART: 7.349 — AB (ref 7.350–7.450)
PO2 ART: 89.3 mmHg (ref 80.0–100.0)
Patient temperature: 98.6
TCO2: 33 mmol/L (ref 0–100)
pCO2 arterial: 58.1 mmHg (ref 35.0–45.0)

## 2014-09-12 LAB — BASIC METABOLIC PANEL
ANION GAP: 14 (ref 5–15)
BUN: 13 mg/dL (ref 6–23)
CHLORIDE: 97 meq/L (ref 96–112)
CO2: 28 meq/L (ref 19–32)
Calcium: 9.4 mg/dL (ref 8.4–10.5)
Creatinine, Ser: 0.63 mg/dL (ref 0.50–1.10)
GFR calc non Af Amer: >90 mL/min (ref >90)
Glucose, Bld: 185 mg/dL — ABNORMAL HIGH (ref 70–99)
POTASSIUM: 4.6 meq/L (ref 3.7–5.3)
SODIUM: 139 meq/L (ref 137–147)

## 2014-09-12 LAB — POCT I-STAT 3, ART BLOOD GAS (G3+)
ACID-BASE EXCESS: 3 mmol/L — AB (ref 0.0–2.0)
Bicarbonate: 31.7 mEq/L — ABNORMAL HIGH (ref 20.0–24.0)
O2 Saturation: 100 %
PO2 ART: 482 mmHg — AB (ref 80.0–100.0)
TCO2: 34 mmol/L (ref 0–100)
pCO2 arterial: 63.7 mmHg (ref 35.0–45.0)
pH, Arterial: 7.304 — ABNORMAL LOW (ref 7.350–7.450)

## 2014-09-12 LAB — EXPECTORATED SPUTUM ASSESSMENT W GRAM STAIN, RFLX TO RESP C

## 2014-09-12 LAB — GLUCOSE, CAPILLARY
GLUCOSE-CAPILLARY: 187 mg/dL — AB (ref 70–99)
Glucose-Capillary: 204 mg/dL — ABNORMAL HIGH (ref 70–99)
Glucose-Capillary: 186 mg/dL — ABNORMAL HIGH (ref 70–99)
Glucose-Capillary: 257 mg/dL — ABNORMAL HIGH (ref 70–99)

## 2014-09-12 LAB — MRSA PCR SCREENING: MRSA by PCR: NEGATIVE

## 2014-09-12 LAB — TRIGLYCERIDES: Triglycerides: 212 mg/dL — ABNORMAL HIGH (ref <150)

## 2014-09-12 MED ORDER — FENTANYL CITRATE 0.05 MG/ML IJ SOLN
INTRAMUSCULAR | Status: AC
  Administered 2014-09-12: 50 ug
  Filled 2014-09-12: qty 4

## 2014-09-12 MED ORDER — CETYLPYRIDINIUM CHLORIDE 0.05 % MT LIQD
7.0000 mL | Freq: Four times a day (QID) | OROMUCOSAL | Status: DC
  Administered 2014-09-12 – 2014-09-14 (×9): 7 mL via OROMUCOSAL

## 2014-09-12 MED ORDER — HYDROCODONE-HOMATROPINE 5-1.5 MG/5ML PO SYRP
5.0000 mL | ORAL_SOLUTION | ORAL | Status: DC | PRN
  Filled 2014-09-12: qty 5

## 2014-09-12 MED ORDER — CITALOPRAM HYDROBROMIDE 40 MG PO TABS
40.0000 mg | ORAL_TABLET | Freq: Every day | ORAL | Status: DC
Start: 2014-09-13 — End: 2014-09-13
  Administered 2014-09-13: 40 mg
  Filled 2014-09-12: qty 1

## 2014-09-12 MED ORDER — LEVALBUTEROL HCL 1.25 MG/0.5ML IN NEBU
1.2500 mg | INHALATION_SOLUTION | Freq: Once | RESPIRATORY_TRACT | Status: DC
  Filled 2014-09-12: qty 0.5

## 2014-09-12 MED ORDER — SODIUM CHLORIDE 0.9 % IV SOLN
INTRAVENOUS | Status: DC
  Administered 2014-09-12 – 2014-09-14 (×2): via INTRAVENOUS

## 2014-09-12 MED ORDER — PANTOPRAZOLE SODIUM 40 MG IV SOLR
40.0000 mg | Freq: Every day | INTRAVENOUS | Status: DC
  Administered 2014-09-12 – 2014-09-14 (×3): 40 mg via INTRAVENOUS
  Filled 2014-09-12 (×4): qty 40

## 2014-09-12 MED ORDER — LORAZEPAM 2 MG/ML IJ SOLN
1.0000 mg | Freq: Once | INTRAMUSCULAR | Status: AC
  Administered 2014-09-12: 1 mg via INTRAVENOUS
  Filled 2014-09-12: qty 1

## 2014-09-12 MED ORDER — LORAZEPAM 2 MG/ML IJ SOLN
1.0000 mg | INTRAMUSCULAR | Status: DC | PRN
  Administered 2014-09-12: 1 mg via INTRAVENOUS
  Filled 2014-09-12: qty 1

## 2014-09-12 MED ORDER — DEXMEDETOMIDINE BOLUS VIA INFUSION
1.0000 ug/kg | Freq: Once | INTRAVENOUS | Status: DC
  Filled 2014-09-12: qty 98

## 2014-09-12 MED ORDER — PROPOFOL 10 MG/ML IV EMUL
0.0000 ug/kg/min | INTRAVENOUS | Status: DC
  Administered 2014-09-12 (×2): 40 ug/kg/min via INTRAVENOUS
  Administered 2014-09-13: 50 ug/kg/min via INTRAVENOUS
  Administered 2014-09-13: 20 ug/kg/min via INTRAVENOUS
  Administered 2014-09-13 – 2014-09-14 (×4): 40 ug/kg/min via INTRAVENOUS
  Filled 2014-09-12 (×9): qty 100

## 2014-09-12 MED ORDER — PROPOFOL 10 MG/ML IV EMUL
INTRAVENOUS | Status: AC
  Administered 2014-09-12: 1000 mg
  Filled 2014-09-12: qty 100

## 2014-09-12 MED ORDER — DEXMEDETOMIDINE HCL IN NACL 200 MCG/50ML IV SOLN
0.0000 ug/kg/h | INTRAVENOUS | Status: DC
  Filled 2014-09-12: qty 50

## 2014-09-12 MED ORDER — INSULIN ASPART 100 UNIT/ML ~~LOC~~ SOLN
0.0000 [IU] | Freq: Every day | SUBCUTANEOUS | Status: DC
  Administered 2014-09-12: 2 [IU] via SUBCUTANEOUS

## 2014-09-12 MED ORDER — LORAZEPAM 0.5 MG PO TABS
0.5000 mg | ORAL_TABLET | Freq: Once | ORAL | Status: AC
  Administered 2014-09-12: 0.5 mg via ORAL
  Filled 2014-09-12: qty 1

## 2014-09-12 MED ORDER — SODIUM CHLORIDE 0.45 % IV SOLN
INTRAVENOUS | Status: DC
  Administered 2014-09-12 – 2014-09-15 (×3): via INTRAVENOUS

## 2014-09-12 MED ORDER — CARVEDILOL 6.25 MG PO TABS
6.2500 mg | ORAL_TABLET | Freq: Two times a day (BID) | ORAL | Status: DC
  Administered 2014-09-12 – 2014-09-13 (×2): 6.25 mg
  Filled 2014-09-12 (×4): qty 1

## 2014-09-12 MED ORDER — MIDAZOLAM HCL 2 MG/2ML IJ SOLN
INTRAMUSCULAR | Status: AC
  Administered 2014-09-12: 2 mg
  Filled 2014-09-12: qty 4

## 2014-09-12 MED ORDER — IPRATROPIUM-ALBUTEROL 0.5-2.5 (3) MG/3ML IN SOLN
3.0000 mL | Freq: Four times a day (QID) | RESPIRATORY_TRACT | Status: DC
Start: 2014-09-13 — End: 2014-09-13
  Administered 2014-09-13 (×2): 3 mL via RESPIRATORY_TRACT
  Filled 2014-09-12 (×2): qty 3

## 2014-09-12 MED ORDER — GUAIFENESIN-CODEINE 100-10 MG/5ML PO SOLN: 5.0000 mL | ORAL | Status: DC | PRN

## 2014-09-12 MED ORDER — IPRATROPIUM-ALBUTEROL 0.5-2.5 (3) MG/3ML IN SOLN: 3.0000 mL | RESPIRATORY_TRACT | Status: DC

## 2014-09-12 MED ORDER — FENTANYL CITRATE 0.05 MG/ML IJ SOLN
100.0000 ug | INTRAMUSCULAR | Status: DC | PRN
  Administered 2014-09-12: 100 ug via INTRAVENOUS
  Administered 2014-09-12: 50 ug via INTRAVENOUS
  Administered 2014-09-13 – 2014-09-14 (×6): 100 ug via INTRAVENOUS
  Filled 2014-09-12 (×7): qty 2

## 2014-09-12 MED ORDER — INSULIN ASPART 100 UNIT/ML ~~LOC~~ SOLN: 0.0000 [IU] | Freq: Three times a day (TID) | SUBCUTANEOUS | Status: DC

## 2014-09-12 MED ORDER — ALBUTEROL SULFATE (2.5 MG/3ML) 0.083% IN NEBU
5.0000 mg | INHALATION_SOLUTION | Freq: Once | RESPIRATORY_TRACT | Status: AC
  Administered 2014-09-12: 5 mg via RESPIRATORY_TRACT

## 2014-09-12 MED ORDER — ALBUTEROL SULFATE (2.5 MG/3ML) 0.083% IN NEBU: 2.5000 mg | INHALATION_SOLUTION | RESPIRATORY_TRACT | Status: DC | PRN

## 2014-09-12 MED ORDER — CHLORHEXIDINE GLUCONATE 0.12 % MT SOLN
15.0000 mL | Freq: Two times a day (BID) | OROMUCOSAL | Status: DC
  Administered 2014-09-12 – 2014-09-14 (×5): 15 mL via OROMUCOSAL
  Filled 2014-09-12 (×5): qty 15

## 2014-09-12 MED ORDER — IPRATROPIUM-ALBUTEROL 0.5-2.5 (3) MG/3ML IN SOLN
3.0000 mL | RESPIRATORY_TRACT | Status: DC
  Administered 2014-09-12 (×5): 3 mL via RESPIRATORY_TRACT
  Filled 2014-09-12 (×4): qty 3

## 2014-09-12 MED ORDER — SODIUM CHLORIDE 0.9 % IV BOLUS (SEPSIS)
1000.0000 mL | Freq: Once | INTRAVENOUS | Status: AC
  Administered 2014-09-12: 1000 mL via INTRAVENOUS

## 2014-09-12 MED ORDER — ETOMIDATE 2 MG/ML IV SOLN
20.0000 mg | Freq: Once | INTRAVENOUS | Status: AC
  Administered 2014-09-12: 20 mg via INTRAVENOUS

## 2014-09-12 MED ORDER — METHYLPREDNISOLONE SODIUM SUCC 125 MG IJ SOLR
60.0000 mg | Freq: Four times a day (QID) | INTRAMUSCULAR | Status: DC
  Administered 2014-09-12 – 2014-09-15 (×12): 60 mg via INTRAVENOUS
  Filled 2014-09-12 (×10): qty 0.96
  Filled 2014-09-12: qty 2
  Filled 2014-09-12 (×8): qty 0.96

## 2014-09-12 MED ORDER — ROCURONIUM BROMIDE 50 MG/5ML IV SOLN
50.0000 mg | Freq: Once | INTRAVENOUS | Status: AC
  Administered 2014-09-12: 50 mg via INTRAVENOUS
  Filled 2014-09-12: qty 5

## 2014-09-12 MED ORDER — ACETAMINOPHEN 325 MG PO TABS
650.0000 mg | ORAL_TABLET | Freq: Once | ORAL | Status: AC
  Administered 2014-09-12: 650 mg via ORAL
  Filled 2014-09-12: qty 2

## 2014-09-12 MED ORDER — MIDAZOLAM HCL 2 MG/2ML IJ SOLN
2.0000 mg | INTRAMUSCULAR | Status: DC | PRN
  Administered 2014-09-12: 2 mg via INTRAVENOUS

## 2014-09-12 MED ORDER — INSULIN ASPART 100 UNIT/ML ~~LOC~~ SOLN
0.0000 [IU] | SUBCUTANEOUS | Status: DC
  Administered 2014-09-13: 4 [IU] via SUBCUTANEOUS
  Administered 2014-09-13 (×2): 7 [IU] via SUBCUTANEOUS
  Administered 2014-09-13 (×2): 4 [IU] via SUBCUTANEOUS
  Administered 2014-09-13: 3 [IU] via SUBCUTANEOUS
  Administered 2014-09-13 – 2014-09-14 (×4): 4 [IU] via SUBCUTANEOUS
  Administered 2014-09-14 – 2014-09-15 (×3): 3 [IU] via SUBCUTANEOUS
  Administered 2014-09-15: 7 [IU] via SUBCUTANEOUS
  Administered 2014-09-15 (×3): 4 [IU] via SUBCUTANEOUS
  Administered 2014-09-15: 7 [IU] via SUBCUTANEOUS
  Administered 2014-09-16: 11 [IU] via SUBCUTANEOUS
  Administered 2014-09-16: 4 [IU] via SUBCUTANEOUS
  Administered 2014-09-16: 7 [IU] via SUBCUTANEOUS
  Administered 2014-09-16: 4 [IU] via SUBCUTANEOUS
  Administered 2014-09-16: 11 [IU] via SUBCUTANEOUS
  Administered 2014-09-16: 7 [IU] via SUBCUTANEOUS
  Administered 2014-09-17 (×2): 4 [IU] via SUBCUTANEOUS
  Administered 2014-09-17: 11 [IU] via SUBCUTANEOUS
  Administered 2014-09-17: 7 [IU] via SUBCUTANEOUS

## 2014-09-12 MED ORDER — HYDRALAZINE HCL 20 MG/ML IJ SOLN
10.0000 mg | Freq: Once | INTRAMUSCULAR | Status: AC
  Administered 2014-09-12: 10 mg via INTRAVENOUS
  Filled 2014-09-12: qty 1

## 2014-09-12 MED ORDER — BUDESONIDE 0.5 MG/2ML IN SUSP
0.5000 mg | Freq: Two times a day (BID) | RESPIRATORY_TRACT | Status: DC
Start: 2014-09-12 — End: 2014-09-18
  Administered 2014-09-12 – 2014-09-18 (×12): 0.5 mg via RESPIRATORY_TRACT
  Filled 2014-09-12 (×14): qty 2

## 2014-09-12 MED ORDER — INSULIN ASPART 100 UNIT/ML ~~LOC~~ SOLN
0.0000 [IU] | SUBCUTANEOUS | Status: DC
Start: 2014-09-12 — End: 2014-09-12
  Administered 2014-09-12: 2 [IU] via SUBCUTANEOUS
  Administered 2014-09-12: 3 [IU] via SUBCUTANEOUS

## 2014-09-12 MED ORDER — LEVALBUTEROL HCL 1.25 MG/0.5ML IN NEBU
5.0000 mg | INHALATION_SOLUTION | Freq: Once | RESPIRATORY_TRACT | Status: DC
  Filled 2014-09-12: qty 2

## 2014-09-12 MED ORDER — ALBUTEROL SULFATE (2.5 MG/3ML) 0.083% IN NEBU
INHALATION_SOLUTION | RESPIRATORY_TRACT | Status: AC
Start: 2014-09-12 — End: 2014-09-12
  Filled 2014-09-12: qty 6

## 2014-09-12 MED ORDER — MIDAZOLAM HCL 2 MG/2ML IJ SOLN
2.0000 mg | INTRAMUSCULAR | Status: DC | PRN
  Administered 2014-09-12 – 2014-09-14 (×5): 2 mg via INTRAVENOUS
  Filled 2014-09-12 (×6): qty 2

## 2014-09-12 MED ORDER — LORAZEPAM 2 MG/ML IJ SOLN: 1.0000 mg | INTRAMUSCULAR | Status: DC | PRN

## 2014-09-12 NOTE — Progress Notes (Addendum)
Report given to receiving RN.

## 2014-09-12 NOTE — Progress Notes (Signed)
Successfully transferred patient to 2M08. Patient's belongings sent with patient.

## 2014-09-12 NOTE — Consult Note (Addendum)
PULMONARY / CRITICAL CARE MEDICINE   Name: [name] MRN: [id number] DOB: Jan 02, 1951    ADMISSION DATE:  09/11/2014  CONSULTATION DATE:  09/12/2014  REFERRING MD :  Johnnye Sima  CHIEF COMPLAINT:  Acute respiratory failure  INITIAL PRESENTATION: 85F with hx COPD, tobacco abuse, DM2, and hypertension presenting to ED 11/4 with COPD exacerbation now with acute respiratory failure.  STUDIES:  None  SIGNIFICANT EVENTS: 11/05 Transferred to 69M and intubated for acute hypercarbic respiratory failure  HISTORY OF PRESENT ILLNESS:  85F with hx COPD, tobacco abuse, DM2, and hypertension presenting to ED 11/4 with dyspnea and productive cough. Hospitalized 08/12/2014 to 08/13/2014 for COPD exacerbation treated with steroid course and Levaquin. In the ED, she received DuoNeb nebulizers and 125 mg of Solu-Medrol and started on zosyn. Admitted to IM teaching service, switched to cefepime and continued on scheduled DuoNeb and prednisone. She had increased work of breathing. ABG with hypercarbia. Failed trial of BiPAP and PCCM was consulted.   PAST MEDICAL HISTORY :  She has a past medical history of Hyperlipidemia; Hypertension; Tobacco abuse; COPD (chronic obstructive pulmonary disease); Asthma; Breast cancer (1991); Sigmoid diverticulitis (80/2008); Anxiety; Depression; Obesity; GERD (gastroesophageal reflux disease); Heart murmur (10/05/11); Pneumonia; Shortness of breath (10/05/11); Diabetes mellitus; Bronchitis; Diarrhea; Constipated; and COPD with exacerbation (04/06/2009).   She has past surgical history that includes Dobutamine stress echo (08/2004); Abdominal hysterectomy; Breast surgery (1991); and Neck surgery (2012).   Prior to Admission medications   Medication Sig Start Date End Date Taking? Authorizing Provider  ACCU-CHEK FASTCLIX LANCETS MISC 1 each by Other route See admin instructions. Check blood sugar daily as needed for high blood sugar.   Yes Historical Provider, MD  albuterol  (PROVENTIL) (2.5 MG/3ML) 0.083% nebulizer solution Take 3 mLs (2.5 mg total) by nebulization every 6 (six) hours as needed for wheezing or shortness of breath. 08/07/14  Yes Jones Bales, MD  albuterol (VENTOLIN HFA) 108 (90 BASE) MCG/ACT inhaler Inhale 2 puffs into the lungs every 6 (six) hours as needed for wheezing or shortness of breath. 08/07/14  Yes Jones Bales, MD  Blood Glucose Monitoring Suppl (ACCU-CHEK NANO SMARTVIEW) W/DEVICE KIT 1 each by Other route See admin instructions. Check blood sugar daily as needed for high blood sugar.   Yes Historical Provider, MD  budesonide-formoterol (SYMBICORT) 80-4.5 MCG/ACT inhaler Inhale 2 puffs into the lungs 2 (two) times daily as needed. For asthma Patient taking differently: Inhale 2 puffs into the lungs 2 (two) times daily. For asthma 08/13/14  Yes Drucilla Schmidt, MD  carvedilol (COREG) 6.25 MG tablet Take 6.25 mg by mouth 2 (two) times daily with a meal.   Yes Historical Provider, MD  citalopram (CELEXA) 40 MG tablet Take 40 mg by mouth daily.   Yes Historical Provider, MD  fluticasone (FLONASE) 50 MCG/ACT nasal spray Place 1 spray into both nostrils daily as needed for allergies. Patient taking differently: Place 1 spray into both nostrils daily.  08/13/14  Yes Blain Pais, MD  glucose blood (ACCU-CHEK SMARTVIEW) test strip 1 each by Other route See admin instructions. Check blood sugar daily as needed for high blood sugar.   Yes Historical Provider, MD  ipratropium-albuterol (DUONEB) 0.5-2.5 (3) MG/3ML SOLN Take 3 mLs by nebulization every 4 (four) hours as needed. Patient taking differently: Take 3 mLs by nebulization every 4 (four) hours as needed (shortness of breath).  09/03/14  Yes Jones Bales, MD  lisinopril-hydrochlorothiazide (PRINZIDE,ZESTORETIC) 20-25 MG per tablet Take 1 tablet by mouth daily.  Yes Historical Provider, MD  metFORMIN (GLUCOPHAGE) 1000 MG tablet Take 1 tablet (1,000 mg total) by mouth 2 (two) times daily  with a meal. 09/03/14  Yes Jones Bales, MD  pravastatin (PRAVACHOL) 40 MG tablet Take 40 mg by mouth daily.   Yes Historical Provider, MD  sodium chloride (OCEAN) 0.65 % SOLN nasal spray Place 1 spray into both nostrils as needed for congestion. Patient taking differently: Place 1 spray into both nostrils daily as needed for congestion.  08/08/14  Yes Otho Bellows, MD  benzonatate (TESSALON) 100 MG capsule Take 1 capsule (100 mg total) by mouth 3 (three) times daily. 08/13/14   Blain Pais, MD  cyclobenzaprine (FLEXERIL) 5 MG tablet TAKE ONE TABLET BY MOUTH AT BEDTIME AS NEEDED FOR MUSCLE SPASMS 09/08/14   Jones Bales, MD  Dextromethorphan-Guaifenesin [facility] SUGAR FREE) 10-100 MG/5ML liquid Take 5 mLs by mouth every 12 (twelve) hours. 08/13/14   Blain Pais, MD  sulfamethoxazole-trimethoprim (BACTRIM DS) 800-160 MG per tablet Take 1 tablet by mouth 2 (two) times daily. 08/13/14   Blain Pais, MD  tiotropium (SPIRIVA) 18 MCG inhalation capsule Place 18 mcg into inhaler and inhale daily.    Historical Provider, MD   No Known Allergies  FAMILY HISTORY:  She indicated that her mother is deceased. She indicated that her father is deceased. She indicated that her sister is deceased. She indicated that her brother is deceased.    SOCIAL HISTORY: She reports that she has been smoking Cigarettes.  She has a 8 pack-year smoking history. She has never used smokeless tobacco. She reports that she drinks about 2.4 oz of alcohol per week. She reports that she does not use illicit drugs.  REVIEW OF SYSTEMS:  Unable to obtain 2/2 sedation  SUBJECTIVE: Intubated and sedated  VITAL SIGNS: Temp:  [97.5 F (36.4 C)-98.3 F (36.8 C)] 98.2 F (36.8 C) (11/05 0915) Pulse Rate:  [103-125] 125 (11/05 1350) Resp:  [17-29] 17 (11/05 1350) BP: (160-193)/(85-105) 190/105 mmHg (11/05 1223) SpO2:  [97 %-100 %] 100 % (11/05 1350) FiO2 (%):  [40 %] 40 % (11/05 1250) Weight:  [215  lb 11.2 oz (97.841 kg)] 215 lb 11.2 oz (97.841 kg) (11/05 0429) HEMODYNAMICS:   VENTILATOR SETTINGS: Vent Mode:  [-] BIPAP FiO2 (%):  [40 %] 40 % Set Rate:  [12 bmp] 12 bmp INTAKE / OUTPUT:  Intake/Output Summary (Last 24 hours) at 09/12/14 1408 Last data filed at 09/12/14 1000  Gross per 24 hour  Intake    123 ml  Output   2475 ml  Net  -2352 ml    PHYSICAL EXAMINATION: General: Very agitated and markedly dyspneic prior to intubation Neuro:  No focal deficits HEENT: NCAT, edentulous Cardiovascular:  Tachycardia, regular rhythm, no m/r/g Lungs:  Diffuse prolonged expiratory wheezes Abdomen:  Soft, NT, +BS Ext:  Warm, no edema Skin:  No rashes  LABS:  CBC  Recent Labs Lab 09/11/14 0400  WBC 8.3  HGB 12.1  HCT 35.6*  PLT 315   Coag's No results for input(s): APTT, INR in the last 168 hours. BMET  Recent Labs Lab 09/11/14 0400 09/12/14 0506  NA 138 139  K 3.5* 4.6  CL 98 97  CO2 25 28  BUN 11 13  CREATININE 0.82 0.63  GLUCOSE 139* 185*   Electrolytes  Recent Labs Lab 09/11/14 0400 09/12/14 0506  CALCIUM 8.8 9.4   Sepsis Markers No results for input(s): LATICACIDVEN, PROCALCITON, O2SATVEN in the last 168  hours. ABG  Recent Labs Lab 09/12/14 1235  PHART 7.349*  PCO2ART 58.1*  PO2ART 89.3   Liver Enzymes No results for input(s): AST, ALT, ALKPHOS, BILITOT, ALBUMIN in the last 168 hours. Cardiac Enzymes No results for input(s): TROPONINI, PROBNP in the last 168 hours. Glucose  Recent Labs Lab 09/11/14 1123 09/11/14 1707 09/11/14 2144 09/12/14 0107 09/12/14 0552 09/12/14 1055  GLUCAP 349* 217* 284* 204* 186* 187*    CXR: Hyperinflated, NAD   ASSESSMENT / PLAN:  PULMONARY OETT 11/05 >>> A: Acute hypercarbic respiratory failure r/t COPD exacerbation P:   Full vent support WUA/SBT daily F/u ABG Budesonide neb BID Duoneb Q4hr Solumedrol Q6hr See ID  CARDIOVASCULAR CVL R IJ 11/05 >>> A:  HTN P:  Holding  carvedilol Low dose IV metoprolol to maintain HR < 115/min  RENAL A:   No acute issues P:   Monitor BMET intermittently Monitor I/Os Correct electrolytes as indicated Maintenance IVFs ordered until TFs initiated  GASTROINTESTINAL A:   No acute issues P:   SUP: IV PPI Consider TFs 11/06  HEMATOLOGIC A:   No acute issues P:  DVT px: SQ heparin Monitor CBC intermittently Transfuse per usual ICU guidelines  INFECTIOUS A:   Presumed HCAP P:   Sputum 11/5 >> RVP 11/4 >>  Abx:  Cefepime, start date 11/04 >>  F/u procalcitonin   ENDOCRINE A:   DM2   P:   Monitor CBG, SSI  NEUROLOGIC A:   Sedated P:   RASS goal: -1, -2 Propofol gtt fent prn, versed prn   FAMILY  - Updates: Husband updated  - Inter-disciplinary family meet or Palliative Care meeting due by:  11/12    TODAY'S SUMMARY: Admitted 11/04 to [facility] with AECOPD. Transferred to ICU with severe resp distress and bronchospasm. Intubated emergently. Systemic steroids, nebulized BDs ordered. Will hold carvedilol (a nonselective beta blocker) and use low dose metoprolol to maintain HR < 115/min.    CCM X 40 mins  Merton Border, MD ; [facility] [phone].  After 5:30 PM or weekends, call [phone] Pulmonary and [place] Pager: [phone]  09/12/2014, 2:08 PM

## 2014-09-12 NOTE — Procedures (Signed)
Central Venous Catheter Insertion Procedure Note [name] [id number] December 29, 1950  Procedure: Insertion of Central Venous Catheter Indications: Assessment of intravascular volume, Drug and/or fluid administration and Frequent blood sampling  Procedure Details Consent: Unable to obtain consent because of altered level of consciousness. Time Out: Verified patient identification, verified procedure, site/side was marked, verified correct patient position, special equipment/implants available, medications/allergies/relevent history reviewed, required imaging and test results available.  Performed Real time [place] was used to ID and cannulate the vessel  Maximum sterile technique was used including antiseptics, cap, gloves, gown, hand hygiene, mask and sheet. Skin prep: Chlorhexidine; local anesthetic administered A antimicrobial bonded/coated triple lumen catheter was placed in the right internal jugular vein using the Seldinger technique.  Evaluation Blood flow good Complications: No apparent complications Patient did tolerate procedure well. Chest X-ray ordered to verify placement.  CXR: pending.  BABCOCK,PETE 09/12/2014, 3:29 PM   I was present for and supervised the entire procedure  Merton Border, MD ; [facility], [place] [phone].  After 5:30 PM or weekends, call [phone]

## 2014-09-12 NOTE — Progress Notes (Signed)
1mg  of Ativan given to patient, witnessed by Lake Bells RN/BSN. Unable to document on pyxis. Noted against MAR.

## 2014-09-12 NOTE — Progress Notes (Signed)
MD seen on unit. Asked to follow up with patient about her verbal complaints of not being able to breathe. New orders given. Will continue to monitor patient for further changes in condition.

## 2014-09-12 NOTE — Progress Notes (Signed)
Inpatient Diabetes Program Recommendations  AACE/ADA: New Consensus Statement on Inpatient Glycemic Control (2013)  Target Ranges:  Prepandial:   less than 140 mg/dL      Peak postprandial:   less than 180 mg/dL (1-2 hours)      Critically ill patients:  140 - 180 mg/dL   Reason for Assessment:  Results for LAMIRACLE, CHAIDEZ (MRN [id number]) as of 09/12/2014 11:27  Ref. Range 09/11/2014 17:07 09/11/2014 21:44 09/12/2014 01:07 09/12/2014 05:52 09/12/2014 10:55  Glucose-Capillary Latest Range: 70-99 mg/dL 217 (H) 284 (H) 204 (H) 186 (H) 187 (H)    Diabetes history: Diabetes Type 2 Outpatient Diabetes medications: Metformin 1000 mg bid Current orders for Inpatient glycemic control:  Novolog sensitive tid with meals.  Consider adding Lantus 15 units daily while patient is in the hospital. Also may consider increasing Novolog correction to moderate tid with meals.    Thanks, Adah Perl, RN, BC-ADM Inpatient Diabetes Coordinator Pager [phone]

## 2014-09-12 NOTE — Progress Notes (Signed)
IMTS night float progress note  S: Patient's nurse calling that patient has increased work of breathing, although reassuring that patient still satting well on 2 L nasal cannula. Upon interview with the patient, patient reports that she is having a lot of anxiety about this her breathing status. She states that she has been trying her best to follow the directions of her nurses and respiratory therapists, but her anxiety and panic and has been an issue in allowing her to follow their directions. Patient states that she is very afraid to be by herself since she feels like she could suffocate or blackout at any time. Patient is asking for something to help with her anxiety.  O:  Filed Vitals:   09/12/14 0104  BP:   Pulse: 111  Temp:   Resp: 18   Gen.: Patient is sitting at the side of bedin mild distress,  Cardiovascular: regular tachycardia, no murmurs rubs or gallops Respiratory: Some increased work of breathing, though mostly pronounced when patient is talking, and much better once patient is not talking, moderate expiratory wheezes throughout bilateral lung fields  A/P: Patient has had persistent wheezing despite a nebulizer treatment 2 hours before interview. However, some component of the patient's distress seems to be anxiety related, which the patient admits. Patient states that she is very afraid to be alone.  - Sitter at bedside  - Continued nebulizer treatments per respiratory therapy  - Arterial blood gas not able to be obtained after 2 attempts  - Lorazepam 0.5 mg for anxiety, would not favor going to a higher dose given patient's respiratory status.

## 2014-09-12 NOTE — Significant Event (Signed)
Rapid Response Event Note  Overview: Time Called: 1219 Arrival Time: 1221 Event Type: Respiratory  Initial Focused Assessment:  Called by primary RN for patient with respiratory distress.  Upon my arrival to patient's room RN and family at bedside.  Patient tripoding, not able to talk in complete sentences, patient states she has a lot of anxiety.  Patient also states she wants to be put to sleep so she can fell better.  Patient c/o of headache and rib painBP elevated 190/105, sats 100% on 3 lpm.     Interventions:  MD at bedside.  Ativan and hydralazine given by primary RN, ABG done and results given to MD.  7.34, CO2 58, O2 89, HCO 31.  Bipap initiated as per MD order and patient was not able to tolerate for more than 2 minutes.  Patient placed on 40 % ventimask, patient seems to be getting tired with increased WOB.  MD consulted PCCM.  Laurey Arrow, NP at bedside.     Event Summary:  Patient transported to 2M08 via bed with monitor and 40% ventimask.  MD's at bedside during transport.  Report given to bedside RN by primary RN   at      at          [facility], [place]

## 2014-09-12 NOTE — Progress Notes (Signed)
Patient continues to complain of not being able to breathe and headache. MD notified. New orders given. Will continue to monitor patient for further changes in condition.

## 2014-09-12 NOTE — Procedures (Deleted)
Central Venous Catheter Insertion Procedure Note JACYLN CARMER [id number] May 21, 1951  Procedure: Insertion of Central Venous Catheter Indications: Assessment of intravascular volume and Drug and/or fluid administration  Procedure Details Consent: Risks of procedure as well as the alternatives and risks of each were explained to the (patient/caregiver).  Consent for procedure obtained. Time Out: Verified patient identification, verified procedure, site/side was marked, verified correct patient position, special equipment/implants available, medications/allergies/relevent history reviewed, required imaging and test results available.  Performed  Maximum sterile technique was used including antiseptics, cap, gloves, gown, hand hygiene, mask and sheet. Skin prep: Chlorhexidine; local anesthetic administered A antimicrobial bonded/coated triple lumen catheter was placed in the right internal jugular vein using the Seldinger technique.  Evaluation Blood flow good Complications: No apparent complications Patient did tolerate procedure well. Chest X-ray ordered to verify placement.  CXR: pending.   Performed under direct MD supervision.  Performed using ultrasound guidance.  Wire visualized in vessel under ultrasound.   Placed by Marni Griffon ACNP

## 2014-09-12 NOTE — Progress Notes (Signed)
  Date: 09/12/2014  Patient name: [name]  Medical record number: [id number]  Date of birth: 1951/11/01   This patient has been seen and the plan of care was discussed with the house staff. Please see their note for complete details. I concur with their findings with the following additions/corrections:  Pt seen with h/o's. Now having more SOB, tachycardia, HTN however has normal SPO2. She has been trialed on bipap which she did not tolerate. She has been given ativan as well.   Blood pressure 190/105, pulse 113, temperature 98.2 F (36.8 C), temperature source Oral, resp. rate 29, height 5\' 7"  (1.702 m), weight 97.841 kg (215 lb 11.2 oz), SpO2 100 %.  cv- tachycardia Chest- rhonchi, wheezes, pt in distress.  abd- bs+, soft, non-tender.   arterial blood gases 7.34/33/96  COPD Anxiety HTN  She will be eval by ICU Will continue to supplement her O2 as needed.  She may need more aggressive support as she is complaining of fatigue (? Intubation).  Hopefully improving her respiratory demands will improve her HTN.   Campbell Riches, MD 09/12/2014, 12:58 PM

## 2014-09-12 NOTE — Progress Notes (Addendum)
Subjective:  Patient was seen and examined this morning. Patient is very anxious and states that she feels like she cannot breathe when she has these coughing spells. Spells come on when she ambulates. She requests to have someone in the room with her. Her main complaints are shortness of breath, cough, anxiety and headache, and she denies other symptoms.  Objective: Vital signs in last 24 hours: Filed Vitals:   09/12/14 0333 09/12/14 0429 09/12/14 0915 09/12/14 1000  BP:  185/95 160/85 172/99  Pulse:  111 110 114  Temp:  97.7 F (36.5 C) 98.2 F (36.8 C)   TempSrc:  Oral Oral   Resp:  22 24   Height:      Weight:  97.841 kg (215 lb 11.2 oz)    SpO2: 100% 99% 100% 100%   Weight change: 2.948 kg (6 lb 8 oz)  Intake/Output Summary (Last 24 hours) at 09/12/14 1144 Last data filed at 09/12/14 1000  Gross per 24 hour  Intake    563 ml  Output   3575 ml  Net  -3012 ml   General: Vital signs reviewed.  Patient is well-developed and well-nourished, in mild distress and cooperative with exam.  Cardiovascular: Tachycardic, regular rhythm, S1 normal, S2 normal, no murmurs, gallops, or rubs. Pulmonary/Chest: Diffuse inspiratory wheezes and expiratory rhonchi. Patient must pause after a sentence to catch her breath. Abdominal: Soft, obese, non-tender, non-distended, BS +, no masses, organomegaly, or guarding present.  Musculoskeletal: No joint deformities, erythema, or stiffness, ROM full and nontender. Extremities: No lower extremity edema bilaterally,  pulses symmetric and intact bilaterally. No cyanosis or clubbing. Neurological: A&O x3. Skin: Warm, dry and intact. No rashes or erythema. Psychiatric: Anxious. Normal mood and affect. Speech and behavior is normal. Cognition and memory are normal.   Lab Results: Basic Metabolic Panel:  Recent Labs Lab 09/11/14 0400 09/12/14 0506  NA 138 139  K 3.5* 4.6  CL 98 97  CO2 25 28  GLUCOSE 139* 185*  BUN 11 13  CREATININE 0.82 0.63   CALCIUM 8.8 9.4   CBC:  Recent Labs Lab 09/11/14 0400  WBC 8.3  HGB 12.1  HCT 35.6*  MCV 86.2  PLT 315   Urine Drug Screen: Drugs of Abuse     Component Value Date/Time   LABOPIA NONE DETECTED 12/29/2009 0633   COCAINSCRNUR NONE DETECTED 12/29/2009 0633   LABBENZ NONE DETECTED 12/29/2009 0633   AMPHETMU NONE DETECTED 12/29/2009 [place] DETECTED 12/29/2009 0633   LABBARB  12/29/2009 0633    NONE DETECTED        DRUG SCREEN FOR MEDICAL PURPOSES ONLY.  IF CONFIRMATION IS NEEDED FOR ANY PURPOSE, NOTIFY LAB WITHIN 5 DAYS.        LOWEST DETECTABLE LIMITS FOR URINE DRUG SCREEN Drug Class       Cutoff (ng/mL) Amphetamine      1000 Barbiturate      200 Benzodiazepine   580 Tricyclics       998 Opiates          300 Cocaine          300 THC              50    Studies/Results: Dg Chest 2 View (if Patient Has Fever And/or Copd)  09/11/2014   CLINICAL DATA:  Shortness of breath for several days.  EXAM: CHEST  2 VIEW  COMPARISON:  09/03/2014  FINDINGS: Normal heart size and pulmonary vascularity. Emphysematous changes  and slight fibrosis in the lungs. No focal airspace disease or consolidation. No blunting of costophrenic angles. No pneumothorax. Postoperative changes in the cervical spine. Surgical clips in the right axilla.  IMPRESSION: No active cardiopulmonary disease.  Emphysematous changes.   Electronically Signed   By: Lucienne Capers M.D.   On: 09/11/2014 04:56   Medications:  I have reviewed the patient's current medications. Prior to Admission:  Prescriptions prior to admission  Medication Sig Dispense Refill Last Dose  . ACCU-CHEK FASTCLIX LANCETS MISC 1 each by Other route See admin instructions. Check blood sugar daily as needed for high blood sugar.   09/10/2014 at Unknown time  . albuterol (PROVENTIL) (2.5 MG/3ML) 0.083% nebulizer solution Take 3 mLs (2.5 mg total) by nebulization every 6 (six) hours as needed for wheezing or shortness of breath. 1 vial 0  09/11/2014 at Unknown time  . albuterol (VENTOLIN HFA) 108 (90 BASE) MCG/ACT inhaler Inhale 2 puffs into the lungs every 6 (six) hours as needed for wheezing or shortness of breath. 1 Inhaler 3 09/11/2014 at Unknown time  . Blood Glucose Monitoring Suppl (ACCU-CHEK NANO SMARTVIEW) W/DEVICE KIT 1 each by Other route See admin instructions. Check blood sugar daily as needed for high blood sugar.   09/10/2014 at Unknown time  . budesonide-formoterol (SYMBICORT) 80-4.5 MCG/ACT inhaler Inhale 2 puffs into the lungs 2 (two) times daily as needed. For asthma (Patient taking differently: Inhale 2 puffs into the lungs 2 (two) times daily. For asthma) 1 Inhaler 12 09/10/2014 at Unknown time  . carvedilol (COREG) 6.25 MG tablet Take 6.25 mg by mouth 2 (two) times daily with a meal.   09/10/2014 at 2100  . citalopram (CELEXA) 40 MG tablet Take 40 mg by mouth daily.   09/10/2014 at Unknown time  . fluticasone (FLONASE) 50 MCG/ACT nasal spray Place 1 spray into both nostrils daily as needed for allergies. (Patient taking differently: Place 1 spray into both nostrils daily. ) 16 g 2 09/10/2014 at Unknown time  . glucose blood (ACCU-CHEK SMARTVIEW) test strip 1 each by Other route See admin instructions. Check blood sugar daily as needed for high blood sugar.   09/10/2014 at Unknown time  . ipratropium-albuterol (DUONEB) 0.5-2.5 (3) MG/3ML SOLN Take 3 mLs by nebulization every 4 (four) hours as needed. (Patient taking differently: Take 3 mLs by nebulization every 4 (four) hours as needed (shortness of breath). ) 360 mL 0 09/11/2014 at Unknown time  . lisinopril-hydrochlorothiazide (PRINZIDE,ZESTORETIC) 20-25 MG per tablet Take 1 tablet by mouth daily.   09/10/2014 at Unknown time  . metFORMIN (GLUCOPHAGE) 1000 MG tablet Take 1 tablet (1,000 mg total) by mouth 2 (two) times daily with a meal. 60 tablet 1 09/10/2014 at Unknown time  . pravastatin (PRAVACHOL) 40 MG tablet Take 40 mg by mouth daily.   09/10/2014 at Unknown time  .  sodium chloride (OCEAN) 0.65 % SOLN nasal spray Place 1 spray into both nostrils as needed for congestion. (Patient taking differently: Place 1 spray into both nostrils daily as needed for congestion. ) 15 mL 3 09/10/2014 at Unknown time  . benzonatate (TESSALON) 100 MG capsule Take 1 capsule (100 mg total) by mouth 3 (three) times daily. 20 capsule 0   . cyclobenzaprine (FLEXERIL) 5 MG tablet TAKE ONE TABLET BY MOUTH AT BEDTIME AS NEEDED FOR MUSCLE SPASMS 30 tablet 0   . Dextromethorphan-Guaifenesin (ROBITUSSIN SUGAR FREE) 10-100 MG/5ML liquid Take 5 mLs by mouth every 12 (twelve) hours. 120 mL 2   . sulfamethoxazole-trimethoprim (  BACTRIM DS) 800-160 MG per tablet Take 1 tablet by mouth 2 (two) times daily. 10 tablet 0   . tiotropium (SPIRIVA) 18 MCG inhalation capsule Place 18 mcg into inhaler and inhale daily.   08/12/2014 at Unknown time   Scheduled Meds: . acetaminophen  650 mg Oral Once  . albuterol      . benzonatate  100 mg Oral TID  . budesonide-formoterol  2 puff Inhalation BID  . carvedilol  6.25 mg Oral BID WC  . ceFEPime (MAXIPIME) IV  1 g Intravenous Q12H  . citalopram  40 mg Oral Daily  . heparin  5,000 Units Subcutaneous 3 times per day  . lisinopril  20 mg Oral Daily   And  . hydrochlorothiazide  25 mg Oral Daily  . insulin aspart  0-5 Units Subcutaneous QHS  . insulin aspart  0-9 Units Subcutaneous TID WC  . ipratropium-albuterol  3 mL Nebulization Q4H  . levalbuterol  1.25 mg Nebulization Once  . LORazepam  1 mg Intravenous Once  . nicotine  7 mg Transdermal Daily  . pravastatin  40 mg Oral q1800  . predniSONE  50 mg Oral Q breakfast  . sodium chloride  3 mL Intravenous Q12H  . sodium chloride  3 mL Intravenous Q12H   Continuous Infusions:  PRN Meds:.fluticasone, guaiFENesin-codeine, LORazepam Assessment/Plan: Active Problems:   COPD exacerbation  COPD exacerbation: Patient continues to complain of shortness of breath and a productive cough worsened by her  anxiety. Anxiety is mostly revolving around her coughing spells when she feels like she cannot breathe. Coughing spells come on when she ambulates. Patient is satting 100% on 2-3 L, but must pause during interview to catch her breath. Patient continues to have rhonchi and wheezes on exam. Patient is becoming progressively more fatigued. ABG showed pH 7.349 CO2 58.1 O2 89.3 HCO3 31.2 Base Excess 5.8. Tried patient on biPAP but patiently was unable to tolerate. Called PCCM, they agree to transfer to ICU 61M.  -Transfer to ICU -Bed Rest w/o bathroom privleges -Ativan 1 mg IV Q4H prn -DuoNeb nebulizer Q4H -Prednisone 50 mg daily for 3 more days -Albuterol nebs Q2H prn -Tessalon 100 mg TID -Flonase 1 spray daily prn -Symbicort (budesonide-formoterol) BID -Guaifenesin-codeine 5 mL Q4H prn -Cefepime 1 gram IV BID and then transition to levaquin on discharge  Hypokalemia: Resolved. Potassium 4.6. -BMET tomorrow am  Headache: Patient complains of headache from cough. She requests tylenol. -Tylenol 650 mg once  HTN: Hypertensive [id number] systolic and tachycardic in the 110s. Likely secondary to anxiety. Patient is on lisinopril-hydrochlorothiazide 20-15 mg daily and Coreg 6.25 mg BID. -Continue home lisinopril-hydrochlorothiazide 20-15 mg daily  -Continue Coreg 6.25 mg twice a day -We will attempt to control her anxiety and see how her blood pressure responds before adjusting her medications.  DM Type II: Blood sugars indicate glucose in the 180s, exacerbated by steroid treatment. HgA1c 6.8% on 06/14/2014. On metformin 500 mg BID at home.  -SSI sensitive with QHS converage -CBG monitoring  -Carb modified diet  Depression and Anxiety: Admits to severe anxiety surrounding shortness of breath. Patient is on Celexa 40 mg daily at home. -Continue home Celexa 40 mg daily -Ativan 1 mg Q4H prn -Sitter  DVT/PE ppx: Heparin SQ TID  Dispo: Disposition is deferred at this time, awaiting improvement of  current medical problems.  Anticipated discharge in approximately 1-2 day(s).   The patient does have a current PCP (Jones Bales, MD) and does need an [facility] hospital follow-up appointment after  discharge.  The patient does not have transportation limitations that hinder transportation to clinic appointments.  .Services Needed at time of discharge: Y = Yes, Blank = No PT:   OT:   RN:   Equipment:   Other:     LOS: 1 day   Osa Craver, DO PGY-1 Internal Medicine Resident Pager # [phone] 09/12/2014 11:44 AM

## 2014-09-12 NOTE — Progress Notes (Signed)
MD paged and RR page to notify them and make them aware of RN's night with patient. Patient has been complaining that she can not breathe and needs help. Asked the MD and RR to come and follow up with patient. No new orders at this time. Will continue to monitor patient for further changes in condition.

## 2014-09-12 NOTE — Procedures (Signed)
Arterial Catheter Insertion Procedure Note JAYLISE PEEK [id number] Jan 11, 1951  Procedure: Insertion of Arterial Catheter  Indications: Blood pressure monitoring  Procedure Details Consent: Unable to obtain consent because of emergent medical necessity. Time Out: Verified patient identification, verified procedure, site/side was marked, verified correct patient position, special equipment/implants available, medications/allergies/relevent history reviewed, required imaging and test results available.  Performed  Maximum sterile technique was used including antiseptics, cap, gloves, gown, hand hygiene, mask and sheet. Skin prep: Chlorhexidine; local anesthetic administered 20 gauge catheter was inserted into right radial artery using the Seldinger technique.  Evaluation Blood flow good; BP tracing good. Complications: No apparent complications.   BABCOCK,PETE 09/12/2014   I was present for and supervised the entire procedure  Merton Border, MD ; [facility] [phone].  After 5:30 PM or weekends, call [phone]

## 2014-09-12 NOTE — Progress Notes (Signed)
eLink Physician-Brief Progress Note Patient Name: [name] DOB: 02-Sep-1951 MRN: [id number]   Date of Service  09/12/2014  HPI/Events of Note  hypotensive  eICU Interventions  Will give 1 liter ns bolus     Intervention Category Major Interventions: Other:  Judith Demps 09/12/2014, 10:29 PM

## 2014-09-12 NOTE — Progress Notes (Signed)
Pt placed on bipap on 3E wore for a few minutes, then took it of  And refused to put back on. Placed on VM by rapid response nurse

## 2014-09-12 NOTE — Procedures (Signed)
Intubation Procedure Note JALAILA CARADONNA [id number] 11-26-1950  Procedure: Intubation Indications: Respiratory insufficiency  Procedure Details Consent: Unable to obtain consent because of emergent medical necessity. Time Out: Verified patient identification, verified procedure, site/side was marked, verified correct patient position, special equipment/implants available, medications/allergies/relevent history reviewed, required imaging and test results available. Performed  MAC and 3 Medications:  Etomidate 20 mg Versed NMB 50 mg zemuron   Evaluation Hemodynamic Status: Stable; O2 sats: stable throughout Patient's Current Condition: stable Complications: No apparent complications Patient did tolerate procedure well. Chest X-ray ordered to verify placement. CXR: pending.   Jacques Earthly, PGY1  [facility] PCCM Pager [phone] till 3 pm If no answer page [phone]   I was present for and supervised the entire procedure  Merton Border, MD ; [facility] [place] [phone].  After 5:30 PM or weekends, call [phone]

## 2014-09-12 NOTE — Procedures (Deleted)
Arterial Catheter Insertion Procedure Note [name] [id number] 1950/12/09  Procedure: Insertion of Arterial Catheter Indications: Blood pressure monitoring  Procedure Details Consent: Unable to obtain consent because of altered level of consciousness. Time Out: Verified patient identification, verified procedure, site/side was marked, verified correct patient position, special equipment/implants available, medications/allergies/relevent history reviewed, required imaging and test results available. Performed  Maximum sterile technique was used including antiseptics, cap, gloves, gown, hand hygiene, mask and sheet. Skin prep: Chlorhexidine; local anesthetic administered 20 gauge catheter was inserted into right radial artery using the Seldinger technique.  Evaluation Blood flow good; BP tracing good. Complications: No apparent complications.  Jacques Earthly, PGY1

## 2014-09-12 NOTE — Progress Notes (Signed)
Patient has increase work of breathing. Expiratory Wheezes are heard during auscultation. BP is 190/105; HR 112; Resp- 26; O2-100% on 3L. Rapid response notified and MD notified. Both are following up with patient. Will continue to monitor patient.

## 2014-09-12 NOTE — Progress Notes (Signed)
Pt progressively hypotensive, UO 85cc in 4 hours, Dr. Halford Chessman notified and new orders carried out.

## 2014-09-13 ENCOUNTER — Inpatient Hospital Stay (HOSPITAL_COMMUNITY): Payer: Medicare Other

## 2014-09-13 DIAGNOSIS — J9602 Acute respiratory failure with hypercapnia: Secondary | ICD-10-CM

## 2014-09-13 DIAGNOSIS — Z789 Other specified health status: Secondary | ICD-10-CM

## 2014-09-13 LAB — RESPIRATORY VIRUS PANEL
ADENOVIRUS: NOT DETECTED
INFLUENZA A H1: NOT DETECTED
INFLUENZA A H3: NOT DETECTED
Influenza A: NOT DETECTED
Influenza B: NOT DETECTED
METAPNEUMOVIRUS: NOT DETECTED
Parainfluenza 1: NOT DETECTED
Parainfluenza 2: NOT DETECTED
Parainfluenza 3: NOT DETECTED
RESPIRATORY SYNCYTIAL VIRUS A: NOT DETECTED
RHINOVIRUS: DETECTED — AB
Respiratory Syncytial Virus B: NOT DETECTED

## 2014-09-13 LAB — URINE CULTURE
CULTURE: NO GROWTH
Colony Count: NO GROWTH
Special Requests: NORMAL

## 2014-09-13 LAB — TROPONIN I

## 2014-09-13 LAB — GLUCOSE, CAPILLARY
GLUCOSE-CAPILLARY: 220 mg/dL — AB (ref 70–99)
GLUCOSE-CAPILLARY: 197 mg/dL — AB (ref 70–99)
GLUCOSE-CAPILLARY: 206 mg/dL — AB (ref 70–99)
GLUCOSE-CAPILLARY: 154 mg/dL — AB (ref 70–99)
GLUCOSE-CAPILLARY: 173 mg/dL — AB (ref 70–99)
Glucose-Capillary: 216 mg/dL — ABNORMAL HIGH (ref 70–99)
Glucose-Capillary: 173 mg/dL — ABNORMAL HIGH (ref 70–99)

## 2014-09-13 LAB — BASIC METABOLIC PANEL
Anion gap: 11 (ref 5–15)
BUN: 15 mg/dL (ref 6–23)
CALCIUM: 8.5 mg/dL (ref 8.4–10.5)
CHLORIDE: 100 meq/L (ref 96–112)
CO2: 29 mEq/L (ref 19–32)
Creatinine, Ser: 0.56 mg/dL (ref 0.50–1.10)
GFR calc Af Amer: >90 mL/min (ref >90)
GFR calc non Af Amer: >90 mL/min (ref >90)
Glucose, Bld: 215 mg/dL — ABNORMAL HIGH (ref 70–99)
Potassium: 4.6 mEq/L (ref 3.7–5.3)
Sodium: 140 mEq/L (ref 137–147)

## 2014-09-13 LAB — CBC
HEMATOCRIT: 36 % (ref 36.0–46.0)
Hemoglobin: 11.8 g/dL — ABNORMAL LOW (ref 12.0–15.0)
MCH: 28.6 pg (ref 26.0–34.0)
MCHC: 32.8 g/dL (ref 30.0–36.0)
MCV: 87.4 fL (ref 78.0–100.0)
Platelets: 330 10*3/uL (ref 150–400)
RBC: 4.12 MIL/uL (ref 3.87–5.11)
RDW: 13.5 % (ref 11.5–15.5)
WBC: 7.3 10*3/uL (ref 4.0–10.5)

## 2014-09-13 LAB — PROCALCITONIN: Procalcitonin: 0.63 ng/mL

## 2014-09-13 MED ORDER — IPRATROPIUM-ALBUTEROL 0.5-2.5 (3) MG/3ML IN SOLN
3.0000 mL | RESPIRATORY_TRACT | Status: DC
  Administered 2014-09-13 – 2014-09-17 (×26): 3 mL via RESPIRATORY_TRACT
  Filled 2014-09-13 (×26): qty 3

## 2014-09-13 MED ORDER — LEVOFLOXACIN IN D5W 500 MG/100ML IV SOLN
500.0000 mg | INTRAVENOUS | Status: DC
  Administered 2014-09-13 – 2014-09-14 (×2): 500 mg via INTRAVENOUS
  Filled 2014-09-13 (×3): qty 100

## 2014-09-13 MED ORDER — LEVOFLOXACIN 500 MG PO TABS
500.0000 mg | ORAL_TABLET | Freq: Every day | ORAL | Status: DC
  Filled 2014-09-13: qty 1

## 2014-09-13 MED ORDER — METOPROLOL TARTRATE 1 MG/ML IV SOLN
2.5000 mg | Freq: Four times a day (QID) | INTRAVENOUS | Status: DC | PRN
  Administered 2014-09-14 – 2014-09-15 (×3): 2.5 mg via INTRAVENOUS
  Filled 2014-09-13 (×5): qty 5

## 2014-09-13 MED ORDER — CITALOPRAM HYDROBROMIDE 20 MG PO TABS
20.0000 mg | ORAL_TABLET | Freq: Every day | ORAL | Status: DC
  Administered 2014-09-14 – 2014-09-18 (×5): 20 mg
  Filled 2014-09-13 (×5): qty 1

## 2014-09-13 MED ORDER — SODIUM CHLORIDE 0.9 % IV SOLN: INTRAVENOUS | Status: DC | PRN

## 2014-09-13 NOTE — Progress Notes (Signed)
INITIAL NUTRITION ASSESSMENT  DOCUMENTATION CODES Per approved criteria  -Obesity Unspecified   INTERVENTION: If pt unable to be extubated, recommend initiation of TF via OGT with Vital HP at 25 ml/h and Prostat 30 ml BID on day 1; on day 2, increase to goal rate of 30 ml/h (720 ml per day) with 60 ml Prostat BID to provide, with calories from Propofol, 1120 kcals (69% of estimated needs), 123 gm protein (100% of estimated needs), 605 ml free water daily.   NUTRITION DIAGNOSIS: Inadequate oral intake related to inability to eat as evidenced by NPO.   Goal: Enteral nutrition to provide 60-70% of estimated calorie needs (22-25 kcals/kg ideal body weight) and 100% of estimated protein needs, based on ASPEN guidelines for hypocaloric, high protein feeding in critically ill obese individuals  Monitor:  Weight trend, I/O's, vent status/settings, labs, initiation of TF  Reason for Assessment: Vent  63 y.o. female  Admitting Dx: <principal problem not specified>  ASSESSMENT: 63 yo woman with a history of COPD, tobacco abuse, type 2 diabetes, depression, and hypertension who presents with dyspnea and productive cough. Patient reports that she has had persistent issues with dyspnea and cough ever since her last hospitalization.  Patient is currently intubated on ventilator support MV: 6.8 L/min Temp (24hrs), Avg:98.1 F (36.7 C), Min:97.5 F (36.4 C), Max:99 F (37.2 C)  Propofol: 17.6 ml/hr  Pt with no signs of fat or muscle wasting.   Labs: CBGs: 197-216 Na, K, BUN WNL  Height: Ht Readings from Last 1 Encounters:  09/12/14 5\' 7"  (1.702 m)    Weight: Wt Readings from Last 1 Encounters:  09/12/14 216 lb 7.9 oz (98.2 kg)    Ideal Body Weight: 61.6 kg  % Ideal Body Weight: 159%  Wt Readings from Last 10 Encounters:  09/12/14 216 lb 7.9 oz (98.2 kg)  09/03/14 215 lb 8 oz (97.75 kg)  08/13/14 214 lb 3.2 oz (97.16 kg)  08/08/14 219 lb 12.8 oz (99.701 kg)  07/27/14 210 lb  (95.255 kg)  06/14/14 217 lb 3.2 oz (98.521 kg)  04/09/14 213 lb 14.4 oz (97.024 kg)  03/10/14 223 lb 12.3 oz (101.5 kg)  02/16/13 215 lb (97.523 kg)  01/03/13 215 lb (97.523 kg)    BMI:  Body mass index is 33.9 kg/(m^2).  Estimated Nutritional Needs: Kcal: 2694 Protein: 120-130 g Fluid: Per MD  Skin: Intact  Diet Order: Diet NPO time specified  EDUCATION NEEDS: -Education not appropriate at this time   Intake/Output Summary (Last 24 hours) at 09/13/14 1104 Last data filed at 09/13/14 0948  Gross per 24 hour  Intake 2761.39 ml  Output   1835 ml  Net 926.39 ml    Last BM: 11/5   Labs:   Recent Labs Lab 09/11/14 0400 09/12/14 0506 09/13/14 0430  NA 138 139 140  K 3.5* 4.6 4.6  CL 98 97 100  CO2 25 28 29   BUN 11 13 15   CREATININE 0.82 0.63 0.56  CALCIUM 8.8 9.4 8.5  GLUCOSE 139* 185* 215*    CBG (last 3)   Recent Labs  09/13/14 0025 09/13/14 0433 09/13/14 0811  GLUCAP 216* 197* 206*    Scheduled Meds: . antiseptic oral rinse  7 mL Mouth Rinse QID  . budesonide (PULMICORT) nebulizer solution  0.5 mg Nebulization BID  . carvedilol  6.25 mg Per Tube BID WC  . ceFEPime (MAXIPIME) IV  1 g Intravenous Q12H  . chlorhexidine  15 mL Mouth Rinse BID  . citalopram  40 mg Per Tube Daily  . heparin  5,000 Units Subcutaneous 3 times per day  . insulin aspart  0-20 Units Subcutaneous 6 times per day  . ipratropium-albuterol  3 mL Nebulization Q6H  . methylPREDNISolone (SOLU-MEDROL) injection  60 mg Intravenous Q6H  . pantoprazole (PROTONIX) IV  40 mg Intravenous Daily  . sodium chloride  3 mL Intravenous Q12H    Continuous Infusions: . sodium chloride 50 mL/hr at 09/12/14 2232  . sodium chloride 20 mL/hr at 09/12/14 2056  . propofol 30 mcg/kg/min (09/13/14 1046)    Past Medical History  Diagnosis Date  . Hyperlipidemia   . Hypertension   . Tobacco abuse   . COPD (chronic obstructive pulmonary disease)     History of multiple hospital admissions  for exercabation   . Asthma   . Breast cancer 1991    s/p lumpectomy, chemotherapy and radiation therapy in 1991. Mammogram in 2007 was normal.  . Sigmoid diverticulitis 80/2008  . Anxiety   . Depression   . Obesity   . GERD (gastroesophageal reflux disease)   . Heart murmur 10/05/11    "first time I ever heard I had one was today"  . Pneumonia   . Shortness of breath 10/05/11    "at rest; lying down; w/exertion"  . Diabetes mellitus   . Bronchitis     h/o  . Diarrhea     h/o  . Constipated     h/o  . COPD with exacerbation 04/06/2009    Qualifier: Diagnosis of  By: Eyvonne Mechanic MD, Doyne Keel      Past Surgical History  Procedure Laterality Date  . Dobutamine stress echo  08/2004    Inferior ischemia, normal LV systolic function, no significant CAD  . Abdominal hysterectomy    . Breast surgery  1991    lumphectomy right breast  . Neck surgery  2012    "Dr. Lynann Bologna  put plate in; did something to my vertebrae"    Terrace Arabia RD, LDN

## 2014-09-13 NOTE — Plan of Care (Signed)
Problem: Consults Goal: COPD Patient Education (See Patient Education Module for education specifics.)  Outcome: Completed/Met Date Met:  09/13/14 Goal: Pulmonary Consult Outcome: Completed/Met Date Met:  09/13/14 Goal: Skin Care Protocol Initiated - if Braden Score 18 or less If consults are not indicated, leave blank or document N/A  Outcome: Completed/Met Date Met:  09/13/14 Goal: Diabetes Guidelines if Diabetic/Glucose > 140 If diabetic or lab glucose is > 140 mg/dl - Initiate Diabetes/Hyperglycemia Guidelines & Document Interventions  Outcome: Completed/Met Date Met:  09/13/14 Goal: Respiratory Therapy Consult Outcome: Completed/Met Date Met:  09/13/14

## 2014-09-13 NOTE — Significant Event (Signed)
CCM team made aware of hematuria. Per MD Joya Gaskins and [facility], continue with current orders.

## 2014-09-13 NOTE — Progress Notes (Signed)
PULMONARY / CRITICAL CARE MEDICINE   Name: [name] MRN: [id number] DOB: November 19, 1950    ADMISSION DATE:  09/11/2014 CONSULTATION DATE:  09/12/2014  REFERRING MD:  Dr. Johnnye Sima (Internal Medicine Teaching Service)  CHIEF COMPLAINT:  Acute respiratory failure  INITIAL PRESENTATION: 63yo F with hx COPD, tobacco abuse, DM2, HTN presented to ED 11/4 with AECOPD now with acute respiratory failure. Pt recent COPD hospitalization 10/5-10/6.   SIGNIFICANT EVENTS: 11/05 intubated for acute hypercarbic respiratory failure   SUBJECTIVE:  11/06: intubated, stable on vent. Hypotensive overnight, responded to 1L NS bolus. Became agitated this morning when RT suctioned, pulling on tubes; propofol increased to prev rate and PRN versed given.  VITAL SIGNS: Temp:  [97.5 F (36.4 C)-99 F (37.2 C)] 99 F (37.2 C) (11/06 0819) Pulse Rate:  [89-129] 117 (11/06 0819) Resp:  [13-29] 20 (11/06 0819) BP: (76-201)/(41-105) 169/89 mmHg (11/06 0800) SpO2:  [97 %-100 %] 100 % (11/06 0800) Arterial Line BP: (65-203)/(19-85) 169/56 mmHg (11/06 0800) FiO2 (%):  [40 %-100 %] 40 % (11/06 0819) Weight:  [98.2 kg (216 lb 7.9 oz)] 98.2 kg (216 lb 7.9 oz) (11/05 1800) HEMODYNAMICS: CVP:  [8 mmHg-20 mmHg] 10 mmHg VENTILATOR SETTINGS: Vent Mode:  [-] PRVC FiO2 (%):  [40 %-100 %] 40 % Set Rate:  [10 bmp-600 bmp] 10 bmp Vt Set:  [600 mL] 600 mL PEEP:  [5 cmH20] 5 cmH20 Plateau Pressure:  [22 cmH20-33 cmH20] 27 cmH20 INTAKE / OUTPUT:  Intake/Output Summary (Last 24 hours) at 09/13/14 0835 Last data filed at 09/13/14 0800  Gross per 24 hour  Intake 2650.34 ml  Output   2045 ml  Net 605.34 ml    PHYSICAL EXAMINATION: General:  Obese AA Female, appears stated age Neuro:  Sedated. RAS -2 HEENT:  NCAT, edentulous Cardiovascular:  Tachycardic but regular rhythm. No m/r/g Lungs:  Exp wheezes bilaterally Abdomen:  Obese, soft, nontender, bowel sounds present Musculoskeletal:  Trace edema LE, feet/hands  WWP. Skin:  No rashes  LABS: PULMONARY  Recent Labs Lab 09/12/14 1235 09/12/14 1836  PHART 7.349* 7.304*  PCO2ART 58.1* 63.7*  PO2ART 89.3 482.0*  HCO3 31.2* 31.7*  TCO2 33.0 34  O2SAT 96.6 100.0    CBC  Recent Labs Lab 09/11/14 0400 09/13/14 0430  HGB 12.1 11.8*  HCT 35.6* 36.0  WBC 8.3 7.3  PLT 315 330    COAGULATION No results for input(s): INR in the last 168 hours.  CARDIAC  No results for input(s): TROPONINI in the last 168 hours. No results for input(s): PROBNP in the last 168 hours.  CHEMISTRY  Recent Labs Lab 09/11/14 0400 09/12/14 0506 09/13/14 0430  NA 138 139 140  K 3.5* 4.6 4.6  CL 98 97 100  CO2 25 28 29   GLUCOSE 139* 185* 215*  BUN 11 13 15   CREATININE 0.82 0.63 0.56  CALCIUM 8.8 9.4 8.5   Estimated Creatinine Clearance: 86.6 mL/min (by C-G formula based on Cr of 0.56).  LIVER No results for input(s): AST, ALT, ALKPHOS, BILITOT, PROT, ALBUMIN, INR in the last 168 hours.  INFECTIOUS  Recent Labs Lab 09/12/14 1354 09/13/14 0430  PROCALCITON 0.39 0.63    ENDOCRINE CBG (last 3)   Recent Labs  09/12/14 2037 09/13/14 0025 09/13/14 0433  GLUCAP 220* 216* 197*      IMAGING x48h Dg Chest Port 1 View  09/13/2014   CLINICAL DATA:  Respiratory failure  EXAM: PORTABLE CHEST - 1 VIEW  COMPARISON:  09/12/2014  FINDINGS: Cardiac shadow is  stable. A right jugular line is again seen in the mid superior vena cava. Endotracheal tube and nasogastric catheter are noted. The endotracheal tube lies 4.7 cm above the carina. No focal infiltrate or sizable effusion is seen. Old rib fractures are noted on the right.  IMPRESSION: Tubes and lines as described above. The overall appearance is stable from the prior exam.   Electronically Signed   By: Inez Catalina M.D.   On: 09/13/2014 07:03   Portable Chest Xray  09/12/2014   CLINICAL DATA:  Acute respiratory failure  EXAM: PORTABLE CHEST - 1 VIEW  COMPARISON:  09/11/2014  FINDINGS: Interval  placement of endotracheal tube in good position. Interval placement of right jugular catheter tip in the upper SVC. No pneumothorax. NG tube enters the stomach with the tip not visualized.  Lungs are clear without infiltrate or effusion. COPD noted. Chronic right rib fractures  IMPRESSION: Satisfactory endotracheal tube placement.  Satisfactory central line placement.  COPD.  No acute cardiopulmonary abnormality.   Electronically Signed   By: Franchot Gallo M.D.   On: 09/12/2014 15:20   Dg Abd Portable 1v  09/12/2014   CLINICAL DATA:  NG tube placement.  EXAM: PORTABLE ABDOMEN - 1 VIEW  COMPARISON:  Scout image for CT scan dated 06/28/2007  FINDINGS: NG tube tip is in the body of the stomach. There is moderate air in the stomach. Large and small bowel are not dilated. No acute osseous abnormality.  IMPRESSION: NG tube tip in the body of the stomach.   Electronically Signed   By: Rozetta Nunnery M.D.   On: 09/12/2014 15:26    ASSESSMENT / PLAN:  PULMONARY OETT 11/05>>> A: Acute hypercarbic respiratory failure r/t AECOPD  -09/13/14 stable on vent, persistent bronchospasm by exam  P:   Full vent support WUA/SBT daily Budesonide neb BID Duoneb q4hr Solumedrol q6hr  CARDIOVASCULAR CVL R IJ 11/05>>> A:  HTN CVP 10  -09/13/14 persistently tachycardic  P:  Holding coreg Low dose IV metoprolol, HR goal <115/min  RENAL A:   No acute issues P:   Monitor bmet intermittently Monitor I/Os Replete lytes as needed  GASTROINTESTINAL A:   No acute issues P:   SUP: IV protonix  HEMATOLOGIC A:   No acute issues P:  DVT px: heparin sq Monitor CBC intermittently  INFECTIOUS Sputum 11/05>> RVP 11/04>> Urine Cx 11/05>> Abx Cefepime, 11/04>>  A:   Presumed HCAP P:   Abx Cefepime  ENDOCRINE A:   DM2   P:   Monitor CBG, SSI  NEUROLOGIC A:   Sedated P:   RASS goal: -1,-2 Propofol gtt Fentanyl prn Versed prn   FAMILY  - Updates: husband updated 11/05.  -  Inter-disciplinary family meet or Palliative Care meeting due by:  11/12  TODAY'S SUMMARY:  63 y.o. female with AECOPD, tx to ICU for severe resp distress and bronchospasm requiring emergent intubation. Cefepime, systemic steroids, nebulized breathing treatments continued today.  Tawanna Sat, MD 09/13/2014, 8:35 AM PGY-2, [place]

## 2014-09-13 NOTE — Significant Event (Signed)
Patient became very agitated after RRT suctioned patient, WUA RASS score +3 at this time. Patient attempting to pull on ETT, pulling herself forward in bed. Patient already has bilateral safety mitts on. Propofol increased back to previous rate; PRN sedation given. Will continue to monitor. Oddis Westling, Therapist, sports.

## 2014-09-14 ENCOUNTER — Inpatient Hospital Stay (HOSPITAL_COMMUNITY): Payer: Medicare Other

## 2014-09-14 DIAGNOSIS — B348 Other viral infections of unspecified site: Secondary | ICD-10-CM

## 2014-09-14 DIAGNOSIS — I517 Cardiomegaly: Secondary | ICD-10-CM

## 2014-09-14 DIAGNOSIS — J9600 Acute respiratory failure, unspecified whether with hypoxia or hypercapnia: Secondary | ICD-10-CM

## 2014-09-14 LAB — CBC
HCT: 36.1 % (ref 36.0–46.0)
Hemoglobin: 11.5 g/dL — ABNORMAL LOW (ref 12.0–15.0)
MCH: 28.3 pg (ref 26.0–34.0)
MCHC: 31.9 g/dL (ref 30.0–36.0)
MCV: 88.7 fL (ref 78.0–100.0)
PLATELETS: 310 10*3/uL (ref 150–400)
RBC: 4.07 MIL/uL (ref 3.87–5.11)
RDW: 13.7 % (ref 11.5–15.5)
WBC: 8.5 10*3/uL (ref 4.0–10.5)

## 2014-09-14 LAB — POCT I-STAT 3, ART BLOOD GAS (G3+)
Acid-Base Excess: 7 mmol/L — ABNORMAL HIGH (ref 0.0–2.0)
BICARBONATE: 33.6 meq/L — AB (ref 20.0–24.0)
O2 Saturation: 99 %
PCO2 ART: 59.2 mmHg — AB (ref 35.0–45.0)
PH ART: 7.362 (ref 7.350–7.450)
PO2 ART: 128 mmHg — AB (ref 80.0–100.0)
TCO2: 35 mmol/L (ref 0–100)

## 2014-09-14 LAB — GLUCOSE, CAPILLARY
GLUCOSE-CAPILLARY: 178 mg/dL — AB (ref 70–99)
Glucose-Capillary: 148 mg/dL — ABNORMAL HIGH (ref 70–99)
Glucose-Capillary: 165 mg/dL — ABNORMAL HIGH (ref 70–99)
Glucose-Capillary: 163 mg/dL — ABNORMAL HIGH (ref 70–99)
Glucose-Capillary: 150 mg/dL — ABNORMAL HIGH (ref 70–99)
Glucose-Capillary: 158 mg/dL — ABNORMAL HIGH (ref 70–99)

## 2014-09-14 LAB — CULTURE, RESPIRATORY: CULTURE: NORMAL

## 2014-09-14 LAB — BASIC METABOLIC PANEL
Anion gap: 12 (ref 5–15)
BUN: 16 mg/dL (ref 6–23)
CALCIUM: 8.6 mg/dL (ref 8.4–10.5)
CO2: 29 meq/L (ref 19–32)
CREATININE: 0.57 mg/dL (ref 0.50–1.10)
Chloride: 99 mEq/L (ref 96–112)
Glucose, Bld: 164 mg/dL — ABNORMAL HIGH (ref 70–99)
Potassium: 4.4 mEq/L (ref 3.7–5.3)
Sodium: 140 mEq/L (ref 137–147)

## 2014-09-14 LAB — MAGNESIUM: Magnesium: 2.3 mg/dL (ref 1.5–2.5)

## 2014-09-14 LAB — CULTURE, RESPIRATORY W GRAM STAIN

## 2014-09-14 LAB — PROCALCITONIN: Procalcitonin: 0.41 ng/mL

## 2014-09-14 LAB — PRO B NATRIURETIC PEPTIDE: PRO B NATRI PEPTIDE: 742.3 pg/mL — AB (ref 0–125)

## 2014-09-14 LAB — TRIGLYCERIDES: Triglycerides: 193 mg/dL — ABNORMAL HIGH (ref <150)

## 2014-09-14 LAB — PHOSPHORUS: Phosphorus: 3.6 mg/dL (ref 2.3–4.6)

## 2014-09-14 MED ORDER — CETYLPYRIDINIUM CHLORIDE 0.05 % MT LIQD
7.0000 mL | Freq: Two times a day (BID) | OROMUCOSAL | Status: DC
  Administered 2014-09-14: 7 mL via OROMUCOSAL

## 2014-09-14 MED ORDER — SODIUM CHLORIDE 0.9 % IV SOLN: INTRAVENOUS | Status: DC | PRN

## 2014-09-14 MED ORDER — HYDRALAZINE HCL 20 MG/ML IJ SOLN
10.0000 mg | INTRAMUSCULAR | Status: DC | PRN
  Administered 2014-09-14: 20 mg via INTRAVENOUS
  Administered 2014-09-15: 10 mg via INTRAVENOUS
  Administered 2014-09-15: 20 mg via INTRAVENOUS
  Filled 2014-09-14 (×3): qty 1

## 2014-09-14 NOTE — Plan of Care (Signed)
Problem: ICU Phase Progression Outcomes Goal: O2 sats trending toward baseline Outcome: Completed/Met Date Met:  09/14/14 Goal: Dyspnea controlled at rest Outcome: Completed/Met Date Met:  09/14/14

## 2014-09-14 NOTE — Plan of Care (Signed)
Problem: ICU Phase Progression Outcomes Goal: Pain controlled with appropriate interventions Outcome: Completed/Met Date Met:  09/14/14 Goal: Initial discharge plan identified Outcome: Completed/Met Date Met:  09/14/14  Problem: Phase I Progression Outcomes Goal: O2 sats > or equal 90% or at baseline Outcome: Completed/Met Date Met:  09/14/14 Goal: Dyspnea controlled at rest Outcome: Completed/Met Date Met:  09/14/14 Goal: Hemodynamically stable Outcome: Completed/Met Date Met:  09/14/14 Goal: Pain controlled Outcome: Completed/Met Date Met:  09/14/14

## 2014-09-14 NOTE — Progress Notes (Signed)
Echo Lab  2D Echocardiogram completed.  [place], RDCS 09/14/2014 3:12 PM

## 2014-09-14 NOTE — Progress Notes (Signed)
Met extubation criteria  Plan Extubate  Dr. Brand Males, M.D., [place] [facility].C.P Pulmonary and Critical Care Medicine Staff Physician [place] [facility] Pager: [phone], If no answer or between  15:00h - 7:00h: call [phone]  09/14/2014 10:27 AM

## 2014-09-14 NOTE — Progress Notes (Signed)
PULMONARY / CRITICAL CARE MEDICINE   Name: [name] MRN: [id number] DOB: August 04, 1951    ADMISSION DATE:  09/11/2014 CONSULTATION DATE:  09/12/2014  REFERRING MD:  Dr. Johnnye Sima (Internal Medicine Teaching Service)  CHIEF COMPLAINT:  Acute respiratory failure  INITIAL PRESENTATION: 63yo F with hx COPD, tobacco abuse, DM2, HTN presented to ED 11/4 with AECOPD now with acute respiratory failure. Pt recent COPD hospitalization 10/5-10/6.   SIGNIFICANT EVENTS: 11/05 intubated for acute hypercarbic respiratory failure 11/06 hypotensive req bolus, PRN sedation, increased BDs. RVP rhinovirus positive  SUBJECTIVE:  11/07: Stable on vent. RN reports hematuria. Pt still intubated/sedated. Req propofol overniht for agitation.  VITAL SIGNS: Temp:  [97.9 F (36.6 C)-99 F (37.2 C)] 98.3 F (36.8 C) (11/07 0400) Pulse Rate:  [87-117] 91 (11/07 0700) Resp:  [10-21] 12 (11/07 0700) BP: (88-179)/(51-98) 169/91 mmHg (11/07 0700) SpO2:  [93 %-100 %] 98 % (11/07 0700) Arterial Line BP: (46-205)/(16-80) 98/80 mmHg (11/07 0700) FiO2 (%):  [40 %] 40 % (11/07 0400) HEMODYNAMICS: CVP:  [2 mmHg-17 mmHg] 14 mmHg VENTILATOR SETTINGS: Vent Mode:  [-] PRVC FiO2 (%):  [40 %] 40 % Set Rate:  [10 bmp] 10 bmp Vt Set:  [600 mL] 600 mL PEEP:  [5 cmH20] 5 cmH20 Plateau Pressure:  [21 cmH20-29 cmH20] 22 cmH20 INTAKE / OUTPUT:  Intake/Output Summary (Last 24 hours) at 09/14/14 7564 Last data filed at 09/14/14 0700  Gross per 24 hour  Intake 2389.23 ml  Output   1835 ml  Net 554.23 ml    PHYSICAL EXAMINATION: General:  Obese AA Female, appears stated age Neuro:  Sedated. RAS -2 HEENT:  NCAT, edentulous Cardiovascular:  Tachycardic but regular rhythm. No m/r/g Lungs:  Prolonged exp wheezes bilaterally Abdomen:  Obese, soft, nontender, bowel sounds present Musculoskeletal:  Trace edema LE, feet/hands WWP. Skin:  No rashes  LABS: PULMONARY  Recent Labs Lab 09/12/14 1235 09/12/14 1836   PHART 7.349* 7.304*  PCO2ART 58.1* 63.7*  PO2ART 89.3 482.0*  HCO3 31.2* 31.7*  TCO2 33.0 34  O2SAT 96.6 100.0    CBC  Recent Labs Lab 09/11/14 0400 09/13/14 0430 09/14/14 0421  HGB 12.1 11.8* 11.5*  HCT 35.6* 36.0 36.1  WBC 8.3 7.3 8.5  PLT 315 330 310    COAGULATION No results for input(s): INR in the last 168 hours.  CARDIAC    Recent Labs Lab 09/13/14 1154  TROPONINI <0.30   No results for input(s): PROBNP in the last 168 hours.  CHEMISTRY  Recent Labs Lab 09/11/14 0400 09/12/14 0506 09/13/14 0430 09/14/14 0421  NA 138 139 140 140  K 3.5* 4.6 4.6 4.4  CL 98 97 100 99  CO2 25 28 29 29   GLUCOSE 139* 185* 215* 164*  BUN 11 13 15 16   CREATININE 0.82 0.63 0.56 0.57  CALCIUM 8.8 9.4 8.5 8.6  MG  --   --   --  2.3  PHOS  --   --   --  3.6   Estimated Creatinine Clearance: 86.6 mL/min (by C-G formula based on Cr of 0.57).  LIVER No results for input(s): AST, ALT, ALKPHOS, BILITOT, PROT, ALBUMIN, INR in the last 168 hours.  INFECTIOUS  Recent Labs Lab 09/12/14 1354 09/13/14 0430 09/14/14 0421  PROCALCITON 0.39 0.63 0.41    ENDOCRINE CBG (last 3)   Recent Labs  09/13/14 1557 09/13/14 1941 09/13/14 2336  GLUCAP 154* 173* 148*      IMAGING x48h Dg Chest Port 1 View  09/13/2014  CLINICAL DATA:  Respiratory failure  EXAM: PORTABLE CHEST - 1 VIEW  COMPARISON:  09/12/2014  FINDINGS: Cardiac shadow is stable. A right jugular line is again seen in the mid superior vena cava. Endotracheal tube and nasogastric catheter are noted. The endotracheal tube lies 4.7 cm above the carina. No focal infiltrate or sizable effusion is seen. Old rib fractures are noted on the right.  IMPRESSION: Tubes and lines as described above. The overall appearance is stable from the prior exam.   Electronically Signed   By: Inez Catalina M.D.   On: 09/13/2014 07:03   Portable Chest Xray  09/12/2014   CLINICAL DATA:  Acute respiratory failure  EXAM: PORTABLE CHEST - 1  VIEW  COMPARISON:  09/11/2014  FINDINGS: Interval placement of endotracheal tube in good position. Interval placement of right jugular catheter tip in the upper SVC. No pneumothorax. NG tube enters the stomach with the tip not visualized.  Lungs are clear without infiltrate or effusion. COPD noted. Chronic right rib fractures  IMPRESSION: Satisfactory endotracheal tube placement.  Satisfactory central line placement.  COPD.  No acute cardiopulmonary abnormality.   Electronically Signed   By: Franchot Gallo M.D.   On: 09/12/2014 15:20   Dg Abd Portable 1v  09/12/2014   CLINICAL DATA:  NG tube placement.  EXAM: PORTABLE ABDOMEN - 1 VIEW  COMPARISON:  Scout image for CT scan dated 06/28/2007  FINDINGS: NG tube tip is in the body of the stomach. There is moderate air in the stomach. Large and small bowel are not dilated. No acute osseous abnormality.  IMPRESSION: NG tube tip in the body of the stomach.   Electronically Signed   By: Rozetta Nunnery M.D.   On: 09/12/2014 15:26    ASSESSMENT / PLAN:  PULMONARY OETT 11/05>>> A: Acute hypercarbic respiratory failure r/t AECOPD secondary to rhinovirus  -09/14/14 stable on vent. Meets sbt criteria.  P:   Aim to extubate today, otherwise Full vent support WUA/SBT daily Budesonide neb BID Duoneb q4hr Solumedrol q6hr  CARDIOVASCULAR CVL R IJ 11/05>>> A:  HTN CVP 11  -09/14/14 tachycardia resolved, no PRN doses metoprolol.   P:  Holding coreg Low dose IV metoprolol, HR goal <115/min CVP goal 8-12 2d echo ProBNP  RENAL A:   No acute issues  -09/14/14 urine cx no growth, reported hematuria no UA, probable traumatic foley P:   Monitor bmet intermittently Monitor I/Os Replete lytes as needed  GASTROINTESTINAL A:   No acute issues P:   SUP: IV protonix TFs  HEMATOLOGIC A:   No acute issues P:  DVT px: heparin sq Monitor CBC intermittently  INFECTIOUS Sputum 11/05>> RVP 11/04>>rhinovirus positive Urine Cx 11/05>> Abx Cefepime,  11/04>>11/06 Levaquin 11/06>>  A:   Presumed HCAP P:   Abx Levaquin  ENDOCRINE A:   DM2   P:   Monitor CBG, SSI  NEUROLOGIC A:   Sedated P:   RASS goal: 0 Switch propofol to precedex, attempt wean to extubate Fentanyl prn Versed prn  FAMILY  - Updates: husband updated 11/05.  - Inter-disciplinary family meet or Palliative Care meeting due by:  11/12  TODAY'S SUMMARY:  63 y.o. female with AECOPD, tx to ICU for severe resp distress and bronchospasm requiring emergent intubation. Levaquin, systemic steroids, nebulized breathing treatments continued today.   Tawanna Sat, MD 09/14/2014, 7:02 AM PGY-2, [place]

## 2014-09-14 NOTE — Progress Notes (Signed)
Patient's flu swab negative for flu. Patient removed from droplet Isolation.

## 2014-09-14 NOTE — Procedures (Signed)
Extubation Procedure Note  Patient Details:   Name: [name] DOB: 1951/07/31 MRN: [id number]   Airway Documentation:     Evaluation  O2 sats: stable throughout Complications: No apparent complications Patient did tolerate procedure well. Bilateral Breath Sounds: Expiratory wheezes, Inspiratory wheezes Suctioning: Airway Yes   Pt extubated per MD order.  Pt tolerated well. Pt placed on 4l [place] with sats of 95%.  RN at bedside.  RT will continue to monitor.  Closson, Deetta Perla 09/14/2014, 10:38 AM

## 2014-09-15 DIAGNOSIS — B348 Other viral infections of unspecified site: Secondary | ICD-10-CM

## 2014-09-15 LAB — CBC
HEMATOCRIT: 37.5 % (ref 36.0–46.0)
HEMOGLOBIN: 12 g/dL (ref 12.0–15.0)
MCH: 28.4 pg (ref 26.0–34.0)
MCHC: 32 g/dL (ref 30.0–36.0)
MCV: 88.9 fL (ref 78.0–100.0)
Platelets: 321 10*3/uL (ref 150–400)
RBC: 4.22 MIL/uL (ref 3.87–5.11)
RDW: 13.9 % (ref 11.5–15.5)
WBC: 8.8 10*3/uL (ref 4.0–10.5)

## 2014-09-15 LAB — GLUCOSE, CAPILLARY
GLUCOSE-CAPILLARY: 130 mg/dL — AB (ref 70–99)
GLUCOSE-CAPILLARY: 219 mg/dL — AB (ref 70–99)
Glucose-Capillary: 151 mg/dL — ABNORMAL HIGH (ref 70–99)
Glucose-Capillary: 217 mg/dL — ABNORMAL HIGH (ref 70–99)
Glucose-Capillary: 152 mg/dL — ABNORMAL HIGH (ref 70–99)
Glucose-Capillary: 243 mg/dL — ABNORMAL HIGH (ref 70–99)

## 2014-09-15 LAB — BASIC METABOLIC PANEL
Anion gap: 11 (ref 5–15)
BUN: 20 mg/dL (ref 6–23)
CO2: 30 meq/L (ref 19–32)
CREATININE: 0.56 mg/dL (ref 0.50–1.10)
Calcium: 8.5 mg/dL (ref 8.4–10.5)
Chloride: 103 mEq/L (ref 96–112)
GFR calc Af Amer: >90 mL/min (ref >90)
Glucose, Bld: 165 mg/dL — ABNORMAL HIGH (ref 70–99)
Potassium: 4.1 mEq/L (ref 3.7–5.3)
Sodium: 144 mEq/L (ref 137–147)

## 2014-09-15 LAB — MAGNESIUM: Magnesium: 2.3 mg/dL (ref 1.5–2.5)

## 2014-09-15 LAB — PHOSPHORUS: Phosphorus: 3.5 mg/dL (ref 2.3–4.6)

## 2014-09-15 MED ORDER — LEVOFLOXACIN 500 MG PO TABS
500.0000 mg | ORAL_TABLET | Freq: Every day | ORAL | Status: AC
  Administered 2014-09-15 – 2014-09-17 (×3): 500 mg via ORAL
  Filled 2014-09-15 (×3): qty 1

## 2014-09-15 MED ORDER — SODIUM CHLORIDE 0.9 % IV SOLN
INTRAVENOUS | Status: DC
  Administered 2014-09-15: 22:00:00 via INTRAVENOUS
  Administered 2014-09-16: 10 mL/h via INTRAVENOUS
  Administered 2014-09-17: 21:00:00 via INTRAVENOUS

## 2014-09-15 MED ORDER — PANTOPRAZOLE SODIUM 40 MG PO TBEC
40.0000 mg | DELAYED_RELEASE_TABLET | Freq: Two times a day (BID) | ORAL | Status: DC
  Administered 2014-09-15 – 2014-09-18 (×6): 40 mg via ORAL
  Filled 2014-09-15 (×6): qty 1

## 2014-09-15 MED ORDER — CARVEDILOL 6.25 MG PO TABS
6.2500 mg | ORAL_TABLET | Freq: Two times a day (BID) | ORAL | Status: DC
  Filled 2014-09-15 (×2): qty 1

## 2014-09-15 MED ORDER — METOPROLOL TARTRATE 12.5 MG HALF TABLET
12.5000 mg | ORAL_TABLET | Freq: Two times a day (BID) | ORAL | Status: DC
  Administered 2014-09-15 – 2014-09-16 (×3): 12.5 mg via ORAL
  Filled 2014-09-15 (×4): qty 1

## 2014-09-15 MED ORDER — ZOLPIDEM TARTRATE 5 MG PO TABS: 5.0000 mg | ORAL_TABLET | Freq: Every evening | ORAL | Status: DC | PRN

## 2014-09-15 MED ORDER — METHYLPREDNISOLONE SODIUM SUCC 125 MG IJ SOLR
60.0000 mg | Freq: Two times a day (BID) | INTRAMUSCULAR | Status: DC
  Administered 2014-09-15 – 2014-09-18 (×6): 60 mg via INTRAVENOUS
  Filled 2014-09-15 (×2): qty 0.96
  Filled 2014-09-15: qty 2
  Filled 2014-09-15 (×6): qty 0.96

## 2014-09-15 MED ORDER — SODIUM CHLORIDE 0.9 % IJ SOLN
10.0000 mL | INTRAMUSCULAR | Status: DC | PRN
  Administered 2014-09-15 – 2014-09-16 (×6): 10 mL
  Filled 2014-09-15 (×3): qty 40

## 2014-09-15 MED ORDER — ZOLPIDEM TARTRATE 5 MG PO TABS
5.0000 mg | ORAL_TABLET | Freq: Once | ORAL | Status: AC
  Administered 2014-09-15: 5 mg via ORAL
  Filled 2014-09-15: qty 1

## 2014-09-15 NOTE — Progress Notes (Signed)
09/15/14 Patient to transfer to Room 5 W 24 from 2 M08 with dx of COPD,IV site 11/4 Rt A/C with NS at 50 ml, CBG every 4 hr, DVT heparin/ SCD,Tele # 2.Marland Kitchen

## 2014-09-15 NOTE — Evaluation (Signed)
Clinical/Bedside Swallow Evaluation Patient Details  Name: [name] MRN: [id number] Date of Birth: 05-Oct-1951  Today's Date: 09/15/2014 Time: 4098-1191 SLP Time Calculation (min): 23 min  Past Medical History:  Past Medical History  Diagnosis Date  . Hyperlipidemia   . Hypertension   . Tobacco abuse   . COPD (chronic obstructive pulmonary disease)     History of multiple hospital admissions for exercabation   . Asthma   . Breast cancer 1991    s/p lumpectomy, chemotherapy and radiation therapy in 1991. Mammogram in 2007 was normal.  . Sigmoid diverticulitis 80/2008  . Anxiety   . Depression   . Obesity   . GERD (gastroesophageal reflux disease)   . Heart murmur 10/05/11    "first time I ever heard I had one was today"  . Pneumonia   . Shortness of breath 10/05/11    "at rest; lying down; w/exertion"  . Diabetes mellitus   . Bronchitis     h/o  . Diarrhea     h/o  . Constipated     h/o  . COPD with exacerbation 04/06/2009    Qualifier: Diagnosis of  By: Eyvonne Mechanic MD, Doyne Keel     Past Surgical History:  Past Surgical History  Procedure Laterality Date  . Dobutamine stress echo  08/2004    Inferior ischemia, normal LV systolic function, no significant CAD  . Abdominal hysterectomy    . Breast surgery  1991    lumphectomy right breast  . Neck surgery  2012    "Dr. Lynann Bologna  put plate in; did something to my vertebrae"   HPI:  63 year old female admitted 09/11/14 due to dyspnea and cough. PMH significant for COPD, DM, HTN, anxiety. Pt intubated 11/5-7/15.    Assessment / Plan / Recommendation Clinical Impression  Pt exhibits no overt s/s aspiration or clinical indication of airway compromise. She does, however, have a dx of COPD which increases risk for silent aspiration. She was also just recently extubated after 3 days, which is sufficient time to increase risk of dysphagia/aspiration. ST will folow for diet tolerance and detemine if objective study is  warranted. Pt appears safe for regular diet with thin liquids at this time.    Aspiration Risk  Moderate    Diet Recommendation Regular;Thin liquid   Liquid Administration via: Straw;Cup Medication Administration: Whole meds with liquid Supervision: Patient able to self feed Compensations: Slow rate;Small sips/bites Postural Changes and/or Swallow Maneuvers: Upright 30-60 min after meal;Seated upright 90 degrees    Other  Recommendations Recommended Consults:  (TBD) Oral Care Recommendations: Oral care BID   Follow Up Recommendations   (TBD)    Frequency and Duration min 2x/week  1 week   Pertinent Vitals/Pain VSS, no pain reported    SLP Swallow Goals    Diet tolerance  Swallow Study Prior Functional Status  Type of Home: House Available Help at Discharge: Family;Available 24 hours/day    General Date of Onset: 09/11/14 HPI: 63 year old female admitted 09/11/14 due to dyspnea and cough. PMH significant for COPD, DM, HTN, anxiety. Pt intubated 11/5-7/15.  Type of Study: Bedside swallow evaluation Previous Swallow Assessment: none found Diet Prior to this Study: Regular;Thin liquids Temperature Spikes Noted: No Respiratory Status: Nasal cannula History of Recent Intubation: Yes Length of Intubations (days): 3 days Date extubated: 09/14/14 Behavior/Cognition: Alert;Cooperative;Pleasant mood Oral Cavity - Dentition: Edentulous Self-Feeding Abilities: Able to feed self Patient Positioning: Upright in chair Baseline Vocal Quality: Clear Volitional Cough: Strong Volitional Swallow:  Able to elicit    Oral/Motor/Sensory Function Overall Oral Motor/Sensory Function: Appears within functional limits for tasks assessed   Ice Chips Ice chips: Not tested   Thin Liquid Thin Liquid: Within functional limits Presentation: Straw    Nectar Thick Nectar Thick Liquid: Not tested   Honey Thick Honey Thick Liquid: Not tested   Puree Puree: Within functional limits Presentation:  Self Fed   Solid   GO    Solid: Within functional limits Presentation: [address] [place], [place], [place]  Shonna Chock 09/15/2014,1:13 PM

## 2014-09-15 NOTE — Progress Notes (Signed)
PULMONARY / CRITICAL CARE MEDICINE   Name: [name] MRN: [id number] DOB: 1951-08-01    ADMISSION DATE:  09/11/2014 CONSULTATION DATE:  09/12/2014  REFERRING MD:  Dr. Ninetta Lights (Internal Medicine Teaching Service)  CHIEF COMPLAINT:  Acute respiratory failure  INITIAL PRESENTATION: 63yo F with hx COPD, tobacco abuse, DM2, HTN presented to ED 11/4 with AECOPD now with acute respiratory failure. Pt recent COPD hospitalization 10/5-10/6.   SIGNIFICANT EVENTS: 11/05 intubated for acute hypercarbic respiratory failure 11/06 hypotensive req bolus, PRN sedation, increased BDs. RVP rhinovirus positive 11/7 extubated SUBJECTIVE:  11/08: extubated , nad. VITAL SIGNS: Temp:  [97.3 F (36.3 C)-98.3 F (36.8 C)] 97.9 F (36.6 C) (11/08 0745) Pulse Rate:  [96-123] 102 (11/08 0900) Resp:  [14-33] 21 (11/08 0900) BP: (129-187)/(63-93) 146/63 mmHg (11/08 0900) SpO2:  [93 %-100 %] 96 % (11/08 0900) HEMODYNAMICS: CVP:  [9 mmHg-13 mmHg] 13 mmHg VENTILATOR SETTINGS:   INTAKE / OUTPUT:  Intake/Output Summary (Last 24 hours) at 09/15/14 1027 Last data filed at 09/15/14 0900  Gross per 24 hour  Intake   2190 ml  Output   1575 ml  Net    615 ml    PHYSICAL EXAMINATION: General:  Obese AA Female, appears stated age Neuro:  Awake and alert rass 2 HEENT:  NCAT, edentulous Cardiovascular:  Tachycardic  103  but regular rhythm. No m/r/g Lungs:  Prolonged exp wheezes bilaterally + VCD component Abdomen:  Obese, soft, nontender, bowel sounds present Musculoskeletal:  Trace edema LE, feet/hands WWP. Skin:  No rashes  LABS: PULMONARY  Recent Labs Lab 09/12/14 1235 09/12/14 1836 09/14/14 0856  PHART 7.349* 7.304* 7.362  PCO2ART 58.1* 63.7* 59.2*  PO2ART 89.3 482.0* 128.0*  HCO3 31.2* 31.7* 33.6*  TCO2 33.0 34 35  O2SAT 96.6 100.0 99.0    CBC  Recent Labs Lab 09/13/14 0430 09/14/14 0421 09/15/14 0405  HGB 11.8* 11.5* 12.0  HCT 36.0 36.1 37.5  WBC 7.3 8.5 8.8  PLT 330  310 321    COAGULATION No results for input(s): INR in the last 168 hours.  CARDIAC    Recent Labs Lab 09/13/14 1154  TROPONINI <0.30    Recent Labs Lab 09/14/14 0900  PROBNP 742.3*    CHEMISTRY  Recent Labs Lab 09/11/14 0400 09/12/14 0506 09/13/14 0430 09/14/14 0421 09/15/14 0405  NA 138 139 140 140 144  K 3.5* 4.6 4.6 4.4 4.1  CL 98 97 100 99 103  CO2 25 28 29 29 30   GLUCOSE 139* 185* 215* 164* 165*  BUN 11 13 15 16 20   CREATININE 0.82 0.63 0.56 0.57 0.56  CALCIUM 8.8 9.4 8.5 8.6 8.5  MG  --   --   --  2.3 2.3  PHOS  --   --   --  3.6 3.5   Estimated Creatinine Clearance: 86.6 mL/min (by C-G formula based on Cr of 0.56).  LIVER No results for input(s): AST, ALT, ALKPHOS, BILITOT, PROT, ALBUMIN, INR in the last 168 hours.  INFECTIOUS  Recent Labs Lab 09/12/14 1354 09/13/14 0430 09/14/14 0421  PROCALCITON 0.39 0.63 0.41    ENDOCRINE CBG (last 3)   Recent Labs  09/14/14 2353 09/15/14 0355 09/15/14 0729  GLUCAP 130* 151* 217*      IMAGING x48h Dg Chest Port 1 View  09/14/2014   CLINICAL DATA:  Intubation.  EXAM: PORTABLE CHEST - 1 VIEW  COMPARISON:  09/13/2014  FINDINGS: There is a right IJ catheter in stable position. Endotracheal and orogastric tubes are in  good position.  Normal heart size and mediastinal contours for technique.  Unchanged appearance of generous lung volumes with coarse markings at the bases. No superimposed effusion or pneumothorax. Right axillary dissection.  IMPRESSION: 1. Tubes and central line are in unchanged position. 2. COPD with stable aeration.   Electronically Signed   By: Tiburcio Pea M.D.   On: 09/14/2014 07:19    ASSESSMENT / PLAN:  PULMONARY OETT 11/05>>>11/7 A: Acute hypercarbic respiratory failure r/t AECOPD secondary to rhinovirus  -09/14/14 extubated  P:   O2 as needed Budesonide neb BID Duoneb q4hr Solumedrol q6hr, changed tro q12h 11/8, decrease again in 2 days 11/8 check swallow  eval 11/8 ppi increased to bid  CARDIOVASCULAR CVL R IJ 11/05>>> A:  HTN   -09/14/14 tachycardia resolved, no PRN doses metoprolol.  -2d with ef 65% bnp 743 P:  Start low dose lopressor and not use coreg as per her home medications 11/8. 11/8, hold ace-I in VCD pt   RENAL A:   No acute issues  -09/14/14 urine cx no growth, reported hematuria no UA, probable traumatic foley P:   Monitor bmet intermittently Monitor I/Os Replete lytes as needed  GASTROINTESTINAL A:   No acute issues P:   SUP: IV protonix TFs  HEMATOLOGIC A:   No acute issues P:  DVT px: heparin sq Monitor CBC intermittently  INFECTIOUS Sputum 11/05>> RVP 11/04>>rhinovirus positive Urine Cx 11/05>>neg Abx Cefepime, 11/04>>11/06 Levaquin 11/06>>  A:   Presumed HCAP P:   Abx Levaquin  ENDOCRINE A:   DM2   P:   Monitor CBG, SSI  NEUROLOGIC A:   Awake and alert Rass 2 P:   RASS goal: 1 Sedation stopped  FAMILY  - Updates: husband updated 11/05.  - Inter-disciplinary family meet or Palliative Care meeting due by:  11/12  TODAY'S SUMMARY:  63 y.o. female with AECOPD, tx to ICU for severe resp distress and bronchospasm requiring emergent intubation. Levaquin, systemic steroids, nebulized breathing treatments continued today.  Extubated and ready to return to floor and to IM teaching service. Have spohen to teaching service to pick up. Swallow eval ordered to start VCD work up.   Brett Canales Antionne Enrique ACNP [facility] PCCM Pager [phone] till 3 pm If no answer page [phone] 09/15/2014, 10:45 AM

## 2014-09-15 NOTE — Progress Notes (Signed)
   Dr. Ronnald Ramp and I received sign out from [facility] regarding transfer of care from PCCM to IMTS which will take effect on 09/16/2014. Patient will be transferred out of the ICU to a med/surg bed.   LOS: 4 days   Christie Craver, DO PGY-1 Internal Medicine Resident Pager # [phone] 09/15/2014 12:16 PM

## 2014-09-15 NOTE — Evaluation (Signed)
Physical Therapy Evaluation Patient Details Name: [name] MRN: [id number] DOB: 1950/12/11 Today's Date: 09/15/2014   History of Present Illness   32F with hx COPD, tobacco abuse, DM2, and hypertension presenting to ED 11/4 with COPD exacerbation now with acute respiratory failure.  Clinical Impression  Pt adm due to above. Pt presents with decreased functional mobility secondary to deficits indicated below (see PT problem list). Pt to benefit from skilled acute PT to address deficits and maximize functional mobility prior to returning home with family.     Follow Up Recommendations Home health PT;Supervision/Assistance - 24 hour    Equipment Recommendations  Rolling walker with 5" wheels    Recommendations for Other Services OT consult     Precautions / Restrictions Precautions Precautions: Fall Restrictions Weight Bearing Restrictions: No      Mobility  Bed Mobility Overal bed mobility: Needs Assistance Bed Mobility: Supine to Sit     Supine to sit: Supervision;HOB elevated     General bed mobility comments: cues for sequencing; pt relying on handrail for bed mobility  Transfers Overall transfer level: Needs assistance Equipment used: 1 person hand held assist Transfers: Sit to/from Stand;Stand Pivot Transfers Sit to Stand: Min assist Stand pivot transfers: Min assist       General transfer comment: cues for sequencing; handheld (A) to balance; pt initially with posterior lean; BP stable; cues for hand placement and safety when pivoting to chair   Ambulation/Gait             General Gait Details: pivotal steps to chair   Stairs            Wheelchair Mobility    Modified Rankin (Stroke Patients Only)       Balance Overall balance assessment: Needs assistance Sitting-balance support: Feet supported;No upper extremity supported Sitting balance-Leahy Scale: Fair Sitting balance - Comments: pt sat EOB ~5 min; no c/o dizziness    Standing balance support: During functional activity;Single extremity supported Standing balance-Leahy Scale: Poor Standing balance comment: handheld (A) to balance                              Pertinent Vitals/Pain Pain Assessment: No/denies pain    Home Living Family/patient expects to be discharged to:: Private residence Living Arrangements: Spouse/significant other;Other relatives;Children Available Help at Discharge: Family;Available 24 hours/day Type of Home: House Home Access: Stairs to enter Entrance Stairs-Rails: None Entrance Stairs-Number of Steps: 1 Home Layout: One level Home Equipment: None      Prior Function Level of Independence: Independent         Comments: reports she was independent with all activities but husband drives     Hand Dominance        Extremity/Trunk Assessment   Upper Extremity Assessment: Defer to OT evaluation           Lower Extremity Assessment: Generalized weakness      Cervical / Trunk Assessment: Normal  Communication   Communication: No difficulties  Cognition Arousal/Alertness: Awake/alert Behavior During Therapy: WFL for tasks assessed/performed Overall Cognitive Status: Within Functional Limits for tasks assessed                      General Comments General comments (skin integrity, edema, etc.): VSS throughout session; encouraged OOB for all meals to incr (A) tolerance     Exercises General Exercises - Lower Extremity Ankle Circles/Pumps: AROM;Both;10 reps;Supine      Assessment/Plan  PT Assessment Patient needs continued PT services  PT Diagnosis Difficulty walking;Generalized weakness   PT Problem List Decreased strength;Decreased activity tolerance;Decreased balance;Decreased mobility;Decreased knowledge of use of DME;Cardiopulmonary status limiting activity  PT Treatment Interventions DME instruction;Gait training;Stair training;Functional mobility training;Therapeutic  activities;Therapeutic exercise;Balance training;Neuromuscular re-education;Patient/family education   PT Goals (Current goals can be found in the Care Plan section) Acute Rehab PT Goals Patient Stated Goal: to go back to my family PT Goal Formulation: With patient Time For Goal Achievement: 09/22/14 Potential to Achieve Goals: Good    Frequency Min 3X/week   Barriers to discharge        Co-evaluation               End of Session Equipment Utilized During Treatment: Oxygen Activity Tolerance: Patient tolerated treatment well Patient left: in chair;with call bell/phone within reach Nurse Communication: Mobility status         Time: 8101-7510 PT Time Calculation (min): 18 min   Charges:   PT Evaluation $Initial PT Evaluation Tier I: 1 Procedure PT Treatments $Therapeutic Activity: 8-22 mins   PT G CodesGustavus Bryant, [place]  [phone] 09/15/2014, 9:34 AM

## 2014-09-16 DIAGNOSIS — J383 Other diseases of vocal cords: Secondary | ICD-10-CM

## 2014-09-16 DIAGNOSIS — J9601 Acute respiratory failure with hypoxia: Secondary | ICD-10-CM

## 2014-09-16 LAB — BASIC METABOLIC PANEL
ANION GAP: 10 (ref 5–15)
BUN: 23 mg/dL (ref 6–23)
CO2: 29 meq/L (ref 19–32)
Calcium: 8.4 mg/dL (ref 8.4–10.5)
Chloride: 100 mEq/L (ref 96–112)
Creatinine, Ser: 0.61 mg/dL (ref 0.50–1.10)
GFR calc Af Amer: >90 mL/min (ref >90)
GFR calc non Af Amer: >90 mL/min (ref >90)
GLUCOSE: 202 mg/dL — AB (ref 70–99)
POTASSIUM: 4.1 meq/L (ref 3.7–5.3)
SODIUM: 139 meq/L (ref 137–147)

## 2014-09-16 LAB — CULTURE, RESPIRATORY: SPECIAL REQUESTS: NORMAL

## 2014-09-16 LAB — GLUCOSE, CAPILLARY
GLUCOSE-CAPILLARY: 180 mg/dL — AB (ref 70–99)
Glucose-Capillary: 164 mg/dL — ABNORMAL HIGH (ref 70–99)
Glucose-Capillary: 226 mg/dL — ABNORMAL HIGH (ref 70–99)
Glucose-Capillary: 161 mg/dL — ABNORMAL HIGH (ref 70–99)
Glucose-Capillary: 264 mg/dL — ABNORMAL HIGH (ref 70–99)
Glucose-Capillary: 300 mg/dL — ABNORMAL HIGH (ref 70–99)
Glucose-Capillary: 243 mg/dL — ABNORMAL HIGH (ref 70–99)

## 2014-09-16 LAB — CBC
HCT: 36.5 % (ref 36.0–46.0)
HEMOGLOBIN: 11.8 g/dL — AB (ref 12.0–15.0)
MCH: 28.6 pg (ref 26.0–34.0)
MCHC: 32.3 g/dL (ref 30.0–36.0)
MCV: 88.4 fL (ref 78.0–100.0)
Platelets: 300 10*3/uL (ref 150–400)
RBC: 4.13 MIL/uL (ref 3.87–5.11)
RDW: 13.9 % (ref 11.5–15.5)
WBC: 9.3 10*3/uL (ref 4.0–10.5)

## 2014-09-16 LAB — PHOSPHORUS: PHOSPHORUS: 3.4 mg/dL (ref 2.3–4.6)

## 2014-09-16 LAB — MAGNESIUM: Magnesium: 2.4 mg/dL (ref 1.5–2.5)

## 2014-09-16 LAB — CULTURE, RESPIRATORY W GRAM STAIN

## 2014-09-16 MED ORDER — ZOLPIDEM TARTRATE 5 MG PO TABS
5.0000 mg | ORAL_TABLET | Freq: Once | ORAL | Status: AC
  Administered 2014-09-16: 5 mg via ORAL
  Filled 2014-09-16: qty 1

## 2014-09-16 MED ORDER — HYDROCODONE-ACETAMINOPHEN 5-325 MG PO TABS
1.0000 | ORAL_TABLET | Freq: Once | ORAL | Status: AC
  Administered 2014-09-16: 2 via ORAL
  Filled 2014-09-16: qty 2

## 2014-09-16 MED ORDER — HYDROCOD POLST-CHLORPHEN POLST 10-8 MG/5ML PO LQCR
5.0000 mL | Freq: Two times a day (BID) | ORAL | Status: DC | PRN
  Administered 2014-09-16 – 2014-09-17 (×2): 5 mL via ORAL
  Filled 2014-09-16 (×2): qty 5

## 2014-09-16 MED ORDER — METOPROLOL TARTRATE 12.5 MG HALF TABLET
12.5000 mg | ORAL_TABLET | Freq: Two times a day (BID) | ORAL | Status: DC
  Administered 2014-09-16: 12.5 mg via ORAL
  Filled 2014-09-16 (×3): qty 1

## 2014-09-16 MED ORDER — FLUTICASONE PROPIONATE 50 MCG/ACT NA SUSP
2.0000 | Freq: Two times a day (BID) | NASAL | Status: DC
  Administered 2014-09-16 – 2014-09-18 (×5): 2 via NASAL
  Filled 2014-09-16: qty 16

## 2014-09-16 MED ORDER — LORATADINE 10 MG PO TABS
10.0000 mg | ORAL_TABLET | Freq: Every day | ORAL | Status: DC
  Administered 2014-09-16 – 2014-09-18 (×3): 10 mg via ORAL
  Filled 2014-09-16 (×4): qty 1

## 2014-09-16 NOTE — Progress Notes (Signed)
NUTRITION FOLLOW UP  Intervention:   -Continue with Carb Modified diet -RD to follow for plan of care  Nutrition Dx:   Inadequate oral intake related to inability to eat as evidenced by NPO; resolved  Goal:   Pt will meet >90% of estimated nutritional needs  Monitor:   PO intake, labs, weight changes, I/O's  Assessment:   63 yo woman with a history of COPD, tobacco abuse, type 2 diabetes, depression, and hypertension who presents with dyspnea and productive cough. Patient reports that she has had persistent issues with dyspnea and cough ever since her last hospitalization.  Pt was extubated 09/14/14. She has been transferred out of ICU. Nutrition needs re-estimated due to change in status. Pt underwent SLP eval on11/8/15. Study revealed no signs of aspiration and pt was advanced to a regular consistency diet with thin liquids. Appetite is good; PO: 75-100%.  Labs reviewed. Glucose: 202 .CBGS: 161-204.   Height: Ht Readings from Last 1 Encounters:  09/15/14 5\' 7"  (1.702 m)    Weight Status:   Wt Readings from Last 1 Encounters:  09/15/14 217 lb 9.6 oz (98.703 kg)   09/12/14 216 lb 7.9 oz (98.2 kg)   Re-estimated needs:  Kcal: 1900-2100 Protein: 78-88 grams Fluid: 1.9-2.1 L  Skin: Intact  Diet Order: Diet Carb Modified   Intake/Output Summary (Last 24 hours) at 09/16/14 1506 Last data filed at 09/16/14 0546  Gross per 24 hour  Intake      0 ml  Output    500 ml  Net   -500 ml    Last BM: 09/16/14   Labs:   Recent Labs Lab 09/14/14 0421 09/15/14 0405 09/16/14 0455  NA 140 144 139  K 4.4 4.1 4.1  CL 99 103 100  CO2 29 30 29   BUN 16 20 23   CREATININE 0.57 0.56 0.61  CALCIUM 8.6 8.5 8.4  MG 2.3 2.3 2.4  PHOS 3.6 3.5 3.4  GLUCOSE 164* 165* 202*    CBG (last 3)   Recent Labs  09/16/14 0530 09/16/14 0808 09/16/14 1349  GLUCAP 226* 161* 264*    Scheduled Meds: . budesonide (PULMICORT) nebulizer solution  0.5 mg Nebulization BID  . citalopram   20 mg Per Tube Daily  . fluticasone  2 spray Each Nare BID  . heparin  5,000 Units Subcutaneous 3 times per day  . insulin aspart  0-20 Units Subcutaneous 6 times per day  . ipratropium-albuterol  3 mL Nebulization Q4H  . levofloxacin  500 mg Oral Daily  . loratadine  10 mg Oral Daily  . methylPREDNISolone (SOLU-MEDROL) injection  60 mg Intravenous Q12H  . metoprolol tartrate  12.5 mg Oral BID  . pantoprazole  40 mg Oral BID AC  . sodium chloride  3 mL Intravenous Q12H    Continuous Infusions: . sodium chloride 10 mL/hr at 09/15/14 2224    Nasean Zapf A. Jimmye Norman, RD, LDN Pager: [phone] After hours Pager: [phone]

## 2014-09-16 NOTE — Progress Notes (Signed)
  Date: 09/16/2014  Patient name: [name]  Medical record number: [id number]  Date of birth: [birth year]   This patient has been seen and the plan of care was discussed with the house staff. Please see their note for complete details. I concur with their findings with the following additions/corrections:  Rhinovirus, COPD Acute Respiratory Failure   She appears to be improving  Greatly appreciate pulm f/u.  Nasal steroids to help with secretions, PND.  Would aim for short course of levaquin (5-7 days?)  Campbell Riches, MD 09/16/2014, 11:49 AM

## 2014-09-16 NOTE — Progress Notes (Signed)
Subjective:  Patient was seen and examined this morning. Patient was recently transferred out of the ICU yesterday to telemetry floor. Patient is doing much better and breathing well on 2 L of oxygen. She is not normally on oxygen at home. Patient denies fever, chills, chest pain, respiratory distress. Anxiety is improved.  Objective: Vital signs in last 24 hours: Filed Vitals:   09/15/14 2315 09/16/14 0300 09/16/14 0520 09/16/14 0856  BP:   133/73   Pulse:   89   Temp:   98.6 F (37 C)   TempSrc:   Oral   Resp:   18   Height:      Weight:      SpO2: 97% 94% 98% 97%   Weight change:   Intake/Output Summary (Last 24 hours) at 09/16/14 1128 Last data filed at 09/16/14 0546  Gross per 24 hour  Intake      0 ml  Output    800 ml  Net   -800 ml   General: Vital signs reviewed.  Patient is well-developed and well-nourished, in mild distress and cooperative with exam.  Cardiovascular:  Regular rate, regular rhythm, S1 normal, S2 normal, no murmurs, gallops, or rubs. Pulmonary/Chest: Very mild inspiratory wheeze in upper lung fields, no rhonchi, no rales. No increased work of breathing. Abdominal: Soft, obese, non-tender, non-distended, BS +, no masses, organomegaly, or guarding present.  Musculoskeletal: No joint deformities, erythema, or stiffness, ROM full and nontender. Extremities: No lower extremity edema bilaterally,  pulses symmetric and intact bilaterally. No cyanosis or clubbing. Neurological: A&O x3. Skin: Warm, dry and intact. No rashes or erythema. Psychiatric: Normal mood and affect. Speech and behavior is normal. Cognition and memory are normal.   Lab Results: Basic Metabolic Panel:  Recent Labs Lab 09/15/14 0405 09/16/14 0455  NA 144 139  K 4.1 4.1  CL 103 100  CO2 30 29  GLUCOSE 165* 202*  BUN 20 23  CREATININE 0.56 0.61  CALCIUM 8.5 8.4  MG 2.3 2.4  PHOS 3.5 3.4   CBC:  Recent Labs Lab 09/15/14 0405 09/16/14 0455  WBC 8.8 9.3  HGB 12.0 11.8*   HCT 37.5 36.5  MCV 88.9 88.4  PLT 321 300   Urine Drug Screen: Drugs of Abuse     Component Value Date/Time   LABOPIA NONE DETECTED 12/29/2009 0633   COCAINSCRNUR NONE DETECTED 12/29/2009 0633   LABBENZ NONE DETECTED 12/29/2009 0633   AMPHETMU NONE DETECTED 12/29/2009 [place] DETECTED 12/29/2009 0633   LABBARB  12/29/2009 0633    NONE DETECTED        DRUG SCREEN FOR MEDICAL PURPOSES ONLY.  IF CONFIRMATION IS NEEDED FOR ANY PURPOSE, NOTIFY LAB WITHIN 5 DAYS.        LOWEST DETECTABLE LIMITS FOR URINE DRUG SCREEN Drug Class       Cutoff (ng/mL) Amphetamine      1000 Barbiturate      200 Benzodiazepine   803 Tricyclics       212 Opiates          300 Cocaine          300 THC              50    Studies/Results: No results found. Medications:  I have reviewed the patient's current medications. Prior to Admission:  Prescriptions prior to admission  Medication Sig Dispense Refill Last Dose  . ACCU-CHEK FASTCLIX LANCETS MISC 1 each by Other route See admin instructions. Check  blood sugar daily as needed for high blood sugar.   09/10/2014 at Unknown time  . albuterol (PROVENTIL) (2.5 MG/3ML) 0.083% nebulizer solution Take 3 mLs (2.5 mg total) by nebulization every 6 (six) hours as needed for wheezing or shortness of breath. 1 vial 0 09/11/2014 at Unknown time  . albuterol (VENTOLIN HFA) 108 (90 BASE) MCG/ACT inhaler Inhale 2 puffs into the lungs every 6 (six) hours as needed for wheezing or shortness of breath. 1 Inhaler 3 09/11/2014 at Unknown time  . Blood Glucose Monitoring Suppl (ACCU-CHEK NANO SMARTVIEW) W/DEVICE KIT 1 each by Other route See admin instructions. Check blood sugar daily as needed for high blood sugar.   09/10/2014 at Unknown time  . budesonide-formoterol (SYMBICORT) 80-4.5 MCG/ACT inhaler Inhale 2 puffs into the lungs 2 (two) times daily as needed. For asthma (Patient taking differently: Inhale 2 puffs into the lungs 2 (two) times daily. For asthma) 1  Inhaler 12 09/10/2014 at Unknown time  . carvedilol (COREG) 6.25 MG tablet Take 6.25 mg by mouth 2 (two) times daily with a meal.   09/10/2014 at 2100  . citalopram (CELEXA) 40 MG tablet Take 40 mg by mouth daily.   09/10/2014 at Unknown time  . fluticasone (FLONASE) 50 MCG/ACT nasal spray Place 1 spray into both nostrils daily as needed for allergies. (Patient taking differently: Place 1 spray into both nostrils daily. ) 16 g 2 09/10/2014 at Unknown time  . glucose blood (ACCU-CHEK SMARTVIEW) test strip 1 each by Other route See admin instructions. Check blood sugar daily as needed for high blood sugar.   09/10/2014 at Unknown time  . ipratropium-albuterol (DUONEB) 0.5-2.5 (3) MG/3ML SOLN Take 3 mLs by nebulization every 4 (four) hours as needed. (Patient taking differently: Take 3 mLs by nebulization every 4 (four) hours as needed (shortness of breath). ) 360 mL 0 09/11/2014 at Unknown time  . lisinopril-hydrochlorothiazide (PRINZIDE,ZESTORETIC) 20-25 MG per tablet Take 1 tablet by mouth daily.   09/10/2014 at Unknown time  . metFORMIN (GLUCOPHAGE) 1000 MG tablet Take 1 tablet (1,000 mg total) by mouth 2 (two) times daily with a meal. 60 tablet 1 09/10/2014 at Unknown time  . pravastatin (PRAVACHOL) 40 MG tablet Take 40 mg by mouth daily.   09/10/2014 at Unknown time  . sodium chloride (OCEAN) 0.65 % SOLN nasal spray Place 1 spray into both nostrils as needed for congestion. (Patient taking differently: Place 1 spray into both nostrils daily as needed for congestion. ) 15 mL 3 09/10/2014 at Unknown time  . benzonatate (TESSALON) 100 MG capsule Take 1 capsule (100 mg total) by mouth 3 (three) times daily. 20 capsule 0   . cyclobenzaprine (FLEXERIL) 5 MG tablet TAKE ONE TABLET BY MOUTH AT BEDTIME AS NEEDED FOR MUSCLE SPASMS 30 tablet 0   . Dextromethorphan-Guaifenesin (ROBITUSSIN SUGAR FREE) 10-100 MG/5ML liquid Take 5 mLs by mouth every 12 (twelve) hours. 120 mL 2   . sulfamethoxazole-trimethoprim (BACTRIM DS)  800-160 MG per tablet Take 1 tablet by mouth 2 (two) times daily. 10 tablet 0   . tiotropium (SPIRIVA) 18 MCG inhalation capsule Place 18 mcg into inhaler and inhale daily.   08/12/2014 at Unknown time   Scheduled Meds: . budesonide (PULMICORT) nebulizer solution  0.5 mg Nebulization BID  . citalopram  20 mg Per Tube Daily  . fluticasone  2 spray Each Nare BID  . heparin  5,000 Units Subcutaneous 3 times per day  . insulin aspart  0-20 Units Subcutaneous 6 times per day  .  ipratropium-albuterol  3 mL Nebulization Q4H  . levofloxacin  500 mg Oral Daily  . loratadine  10 mg Oral Daily  . methylPREDNISolone (SOLU-MEDROL) injection  60 mg Intravenous Q12H  . metoprolol tartrate  12.5 mg Oral BID  . pantoprazole  40 mg Oral BID AC  . sodium chloride  3 mL Intravenous Q12H   Continuous Infusions: . sodium chloride 10 mL/hr at 09/15/14 2224   PRN Meds:.[CANCELED] Place/Maintain arterial line **AND** sodium chloride, sodium chloride, albuterol, hydrALAZINE, metoprolol, sodium chloride Assessment/Plan: Active Problems:   COPD exacerbation   Acute respiratory failure   Endotracheally intubated   Rhinovirus infection   Vocal cord dysfunction  Acute on Chronic COPD exacerbation 2/2 to Rhinovirus: Patient was transferred to the ICU for intubation on 09/13/14. Patient improved over the weekend and was extubated. She was transferred to the floor on 09/15/14 and has been on 2 L of oxygen via [place]. Breathing has much improved. Patient is not longer in any distress and only has mild inspiratory wheezes on lung auscultation. Patient does have post-extubation laryngeal irritation and edema and post-nasal drip and congestion. We will continue to manage her symptoms as she improves. -Ativan 1 mg IV Q4H prn -DuoNeb nebulizer Q4H -Methylprednisolone 60 mg IV BID -Albuterol nebs Q2H prn -Flonase 2 spray daily  -Claritin 10 mg daily -Pulmicort (budesonide) nebulizer BID -Levaquin 500 mg po (stop  09/17/14) -Consider ENT consult if wheeze persists -Protonix 40 mg BID -Wean O2 -PT -Diet Carb Modified  Hypokalemia: Resolved. Potassium 4.1. -BMET tomorrow am  HTN: Normotensive 117/80-133/73. Patient is on lisinopril-hydrochlorothiazide 20-15 mg daily and Coreg 6.25 mg BID at home. Patient was on metoprolol in the ICU with metoprolol and hydralazine prn. Patient has not required prn medications in 2 days. We will restart home medications this afternoon. -Lisinopril-HCTZ 20-15 mg daily -Coreg 6.25 mg BID  DM Type II: Blood sugars indicate glucose 160s-220s. HgA1c 6.8% on 06/14/2014. On metformin 500 mg BID at home.  -SSI sensitive with QHS converage -CBG monitoring  -Carb modified diet  Depression and Anxiety: Improved. Patient is on Celexa 20 mg daily at home.  -Continue Celexa 20 mg daily  DVT/PE ppx: Heparin SQ TID  Dispo: Disposition is deferred at this time, awaiting improvement of current medical problems.  Anticipated discharge in approximately 1-2 day(s).   The patient does have a current PCP (Jones Bales, MD) and does need an [facility] hospital follow-up appointment after discharge.  The patient does not have transportation limitations that hinder transportation to clinic appointments.  .Services Needed at time of discharge: Y = Yes, Blank = No PT:   OT:   RN:   Equipment:   Other:     LOS: 5 days   Osa Craver, DO PGY-1 Internal Medicine Resident Pager # [phone] 09/16/2014 11:28 AM

## 2014-09-16 NOTE — Plan of Care (Signed)
Problem: Phase II Progression Outcomes Goal: Dyspnea controlled w/progressive activity Outcome: Progressing     

## 2014-09-16 NOTE — Progress Notes (Signed)
Physical Therapy Treatment Patient Details Name: [name] MRN: [id number] DOB: [birth year] Today's Date: 09/16/2014    History of Present Illness  43F with hx COPD, tobacco abuse, DM2, and hypertension presenting to ED 11/4 with COPD exacerbation now with acute respiratory failure.    PT Comments    Pt progressing well, ambulated 150' with RW and min-guard A, O2 sats maintained in 90's on 2L O2. Pt encouraged by progress. PT will continue to follow.   Follow Up Recommendations  Home health PT;Supervision/Assistance - 24 hour     Equipment Recommendations  Rolling walker with 5" wheels    Recommendations for Other Services OT consult     Precautions / Restrictions Precautions Precautions: Fall Precaution Comments: watch O2 sats Restrictions Weight Bearing Restrictions: No    Mobility  Bed Mobility Overal bed mobility: Needs Assistance Bed Mobility: Sit to Supine       Sit to supine: Supervision;HOB elevated   General bed mobility comments: pt able to scoot self into bed and get each leg into bed, is unable to lie flat due to breathing difficulties so HOB kept elevated  Transfers Overall transfer level: Needs assistance Equipment used: None Transfers: Sit to/from Stand Sit to Stand: Min guard         General transfer comment: sit to stand from bed and BSC with min-guard, no LOB, vc's for hand placement when standing to RW before ambulation  Ambulation/Gait Ambulation/Gait assistance: Min guard Ambulation Distance (Feet): 150 Feet Assistive device: Rolling walker (2 wheeled) Gait Pattern/deviations: Step-through pattern;Decreased stride length;Trunk flexed Gait velocity: decreased   General Gait Details: used RW for energy conservation, pt maintained on 2L O2, standing rest breaks x30 sec every 50'. vc's for pursed lip breathing. Pt notes anxiety when she feels she cannot breathe. O2 sats maintained at 94%.   Stairs            Wheelchair  Mobility    Modified Rankin (Stroke Patients Only)       Balance Overall balance assessment: Needs assistance Sitting-balance support: No upper extremity supported;Feet supported Sitting balance-Leahy Scale: Good     Standing balance support: No upper extremity supported;During functional activity Standing balance-Leahy Scale: Good Standing balance comment: pt able to stand in fwd flexed position for cleaning self after use of BSC with supervision, no LOB                    Cognition Arousal/Alertness: Awake/alert Behavior During Therapy: WFL for tasks assessed/performed Overall Cognitive Status: Within Functional Limits for tasks assessed                      Exercises      General Comments        Pertinent Vitals/Pain Pain Assessment: Faces Faces Pain Scale: Hurts little more Pain Location: ribs Pain Descriptors / Indicators: Aching Pain Intervention(s): Patient requesting pain meds-RN notified    Home Living                      Prior Function            PT Goals (current goals can now be found in the care plan section) Acute Rehab PT Goals Patient Stated Goal: to go back to my family PT Goal Formulation: With patient Time For Goal Achievement: 09/22/14 Potential to Achieve Goals: Good Progress towards PT goals: Progressing toward goals    Frequency  Min 3X/week    PT Plan Current plan  remains appropriate    Co-evaluation             End of Session Equipment Utilized During Treatment: Oxygen;Gait belt Activity Tolerance: Patient tolerated treatment well Patient left: in bed;with call bell/phone within reach;with family/visitor present     Time: 4010-2725 PT Time Calculation (min): 33 min  Charges:  $Gait Training: 23-37 mins                    G Codes:     Leighton Roach, PT  Acute Rehab Services  [phone]  Leighton Roach 09/16/2014, 2:06 PM

## 2014-09-16 NOTE — Progress Notes (Signed)
Speech Language Pathology Treatment: Dysphagia  Patient Details Name: MILLENIA WALDVOGEL MRN: [id number] DOB: 1951-04-20 Today's Date: 09/16/2014 Time: 6812-7517 SLP Time Calculation (min): 9 min  Assessment / Plan / Recommendation Clinical Impression  Oral and pharyngeal swallow within functional limits during observation with modified independence .  SLP reviewed education re: increased aspiration risk with COPD and recent intubation with clinical reasoning for strategies.  Continue regular diet and thin liquids; ST will sign off.  Pt. became labile x 1 stating she wants to go home but is "scared the same thing will happen."    HPI HPI: 63 year old female admitted 09/11/14 due to dyspnea and cough. PMH significant for COPD, DM, HTN, anxiety. Pt intubated 11/5-7/15.    Pertinent Vitals Pain Assessment: No/denies pain  SLP Plan  All goals met;Discharge SLP treatment due to (comment)    Recommendations Diet recommendations: Regular;Thin liquid Liquids provided via: Straw;Cup Medication Administration: Whole meds with liquid Supervision: Patient able to self feed Compensations: Slow rate;Small sips/bites Postural Changes and/or Swallow Maneuvers: Upright 30-60 min after meal;Seated upright 90 degrees              Oral Care Recommendations: Oral care BID Follow up Recommendations: None Plan: All goals met;Discharge SLP treatment due to (comment)    GO     Houston Siren 09/16/2014, 10:05 AM   Orbie Pyo Colvin Caroli.Ed [employer] [phone]

## 2014-09-16 NOTE — Progress Notes (Signed)
CARE MANAGEMENT NOTE 09/16/2014  Patient:  [facility]   Account Number:  [id number]  Date Initiated:  09/11/2014  Documentation initiated by:  HUTCHINSON,CRYSTAL  Subjective/Objective Assessment:   COPD     Action/Plan:   CM to follow for disposition needs   Anticipated DC Date:  09/12/2014   Anticipated DC Plan:  [place]  CM consult      [facility]  HOME HEALTH   Choice offered to / List presented to:  C-1 Patient        [place] arranged  HH-2 PT      [place]   Status of service:  Completed, signed off Medicare Important Message given?  YES (If response is "NO", the following Medicare IM given date fields will be blank) Date Medicare IM given:  09/16/2014 Medicare IM given by:  Tomi Bamberger Date Additional Medicare IM given:   Additional Medicare IM given by:    Discharge Disposition:  [place]  Per UR Regulation:  Reviewed for med. necessity/level of care/duration of stay  If discussed at [place] of Stay Meetings, dates discussed:    Comments:  09/16/2014 1600 NCM spoke to pt and requested [employer] for [facility]. Pt requesting RW. Will recheck oxygen sat for home oxygen. Jonnie Finner RN CCM Case Mgmt phone [phone]  09/16/2014 1500 NCM spoke to pt and she lives at home with husband. She able to purchase her medications. Offered choice for [facility]. Explained she may need oxygen for home. Will have Unit RN check oxygen sats on RA while ambulating. She has nebulizer at home. Pt states she will decide if RW is needed. Jonnie Finner RN CCM Case Mgmt phone [phone]  Mariann Laster RN, BSN, MSHL, CCM  Nurse - Case Manager,  (Unit [facility])  [phone]  09/11/2014 Hx/o 3 admissions and 2 ER visits over past 6 months. ADM:  COPD Social:  From home with spouse. Smoker:  yes THN:  active and gives handoff that patient self manages her care and in denial re:  smoking contributes to poor health  and believes causes r/t medications. Dispo Plan:  Home / [place] CM will continue to monitor for needs as indicated.

## 2014-09-16 NOTE — Progress Notes (Signed)
PULMONARY / CRITICAL CARE MEDICINE   Name: [name] MRN: [id number] DOB: 11-19-1950    ADMISSION DATE:  09/11/2014 CONSULTATION DATE:  09/12/2014  REFERRING MD:  Dr. Johnnye Sima (Internal Medicine Teaching Service)  CHIEF COMPLAINT:  Acute respiratory failure  INITIAL PRESENTATION: 63yo F with hx COPD, tobacco abuse, DM2, HTN presented to ED 11/4 with AECOPD now with acute respiratory failure. Pt recent COPD hospitalization 10/5-10/6.   SIGNIFICANT EVENTS: 11/05 intubated for acute hypercarbic respiratory failure 11/06 hypotensive req bolus, PRN sedation, increased BDs. RVP rhinovirus positive 11/7 extubated 11/8 transferred to medical ward   SUBJECTIVE:  11/08: extubated , nad. VITAL SIGNS: Temp:  [98.1 F (36.7 C)-98.7 F (37.1 C)] 98.6 F (37 C) (11/09 0520) Pulse Rate:  [50-109] 89 (11/09 0520) Resp:  [14-26] 18 (11/09 0520) BP: (94-178)/(54-102) 133/73 mmHg (11/09 0520) SpO2:  [93 %-100 %] 97 % (11/09 0856) Weight:  [98.703 kg (217 lb 9.6 oz)] 98.703 kg (217 lb 9.6 oz) (11/08 1622) 2 liters    INTAKE / OUTPUT:  Intake/Output Summary (Last 24 hours) at 09/16/14 0905 Last data filed at 09/16/14 0546  Gross per 24 hour  Intake      0 ml  Output    800 ml  Net   -800 ml    PHYSICAL EXAMINATION: General:  Obese AA Female, appears stated age Neuro:  Awake and alert rass 2 HEENT:  NCAT, edentulous. RU tooth roots concerning  Cardiovascular:  rrr no MRG Lungs:  Prolonged exp wheezes bilaterally + VCD component Abdomen:  Obese, soft, nontender, bowel sounds present Musculoskeletal:  Trace edema LE, feet/hands WWP. Skin:  No rashes  LABS:  CBC  Recent Labs Lab 09/14/14 0421 09/15/14 0405 09/16/14 0455  HGB 11.5* 12.0 11.8*  HCT 36.1 37.5 36.5  WBC 8.5 8.8 9.3  PLT 310 321 300    CHEMISTRY  Recent Labs Lab 09/12/14 0506 09/13/14 0430 09/14/14 0421 09/15/14 0405 09/16/14 0455  NA 139 140 140 144 139  K 4.6 4.6 4.4 4.1 4.1  CL 97 100 99  103 100  CO2 28 29 29 30 29   GLUCOSE 185* 215* 164* 165* 202*  BUN 13 15 16 20 23   CREATININE 0.63 0.56 0.57 0.56 0.61  CALCIUM 9.4 8.5 8.6 8.5 8.4  MG  --   --  2.3 2.3 2.4  PHOS  --   --  3.6 3.5 3.4   ENDOCRINE CBG (last 3)   Recent Labs  09/16/14 0103 09/16/14 0530 09/16/14 0808  GLUCAP 180* 226* 161*   IMAGING x48h No results found.  ASSESSMENT / PLAN:  Acute hypercarbic respiratory failure r/t AECOPD secondary to rhinovirus Element of vocal cord dysfxn -09/14/14 extubated Still w/ upper airway component  REC:   O2 as needed Budesonide neb BID Duoneb q4hr Solumedrol  changed to q12h 11/8, decrease again on 11/10 SLP following  Cont BID PPI Add flonase and Claritin  No more ace-I levaquin 11/6>>> Will need dental eval out-pt.. Prob needs at least 2 extractions.    HTN REC:  low dose lopressor and not use coreg as per her home medications 11/8.     Merton Border, MD ; [facility] [phone].  After 5:30 PM or weekends, call [phone] 09/16/2014, 9:05 AM

## 2014-09-16 NOTE — Progress Notes (Signed)
Utilization review complete. Brendaly Townsel RN CCM Case Mgmt phone [phone] 

## 2014-09-17 LAB — BASIC METABOLIC PANEL
Anion gap: 11 (ref 5–15)
BUN: 17 mg/dL (ref 6–23)
CALCIUM: 8.6 mg/dL (ref 8.4–10.5)
CO2: 29 mEq/L (ref 19–32)
Chloride: 99 mEq/L (ref 96–112)
Creatinine, Ser: 0.59 mg/dL (ref 0.50–1.10)
GFR calc non Af Amer: >90 mL/min (ref >90)
Glucose, Bld: 246 mg/dL — ABNORMAL HIGH (ref 70–99)
Potassium: 4.2 mEq/L (ref 3.7–5.3)
Sodium: 139 mEq/L (ref 137–147)

## 2014-09-17 LAB — GLUCOSE, CAPILLARY
GLUCOSE-CAPILLARY: 225 mg/dL — AB (ref 70–99)
GLUCOSE-CAPILLARY: 177 mg/dL — AB (ref 70–99)
GLUCOSE-CAPILLARY: 258 mg/dL — AB (ref 70–99)
GLUCOSE-CAPILLARY: 257 mg/dL — AB (ref 70–99)
Glucose-Capillary: 181 mg/dL — ABNORMAL HIGH (ref 70–99)
Glucose-Capillary: 191 mg/dL — ABNORMAL HIGH (ref 70–99)
Glucose-Capillary: 271 mg/dL — ABNORMAL HIGH (ref 70–99)

## 2014-09-17 MED ORDER — LISINOPRIL 20 MG PO TABS
20.0000 mg | ORAL_TABLET | Freq: Every day | ORAL | Status: DC
  Filled 2014-09-17: qty 1

## 2014-09-17 MED ORDER — INSULIN ASPART 100 UNIT/ML ~~LOC~~ SOLN
0.0000 [IU] | Freq: Three times a day (TID) | SUBCUTANEOUS | Status: DC
  Administered 2014-09-17: 8 [IU] via SUBCUTANEOUS
  Administered 2014-09-18: 5 [IU] via SUBCUTANEOUS
  Administered 2014-09-18: 3 [IU] via SUBCUTANEOUS

## 2014-09-17 MED ORDER — IPRATROPIUM-ALBUTEROL 0.5-2.5 (3) MG/3ML IN SOLN
3.0000 mL | Freq: Four times a day (QID) | RESPIRATORY_TRACT | Status: DC
  Administered 2014-09-18 (×3): 3 mL via RESPIRATORY_TRACT
  Filled 2014-09-17 (×3): qty 3

## 2014-09-17 MED ORDER — HYDROCHLOROTHIAZIDE 25 MG PO TABS
25.0000 mg | ORAL_TABLET | Freq: Every day | ORAL | Status: DC
  Administered 2014-09-17 – 2014-09-18 (×2): 25 mg via ORAL
  Filled 2014-09-17 (×2): qty 1

## 2014-09-17 MED ORDER — ZOLPIDEM TARTRATE 5 MG PO TABS
5.0000 mg | ORAL_TABLET | Freq: Every day | ORAL | Status: DC
  Administered 2014-09-17: 5 mg via ORAL
  Filled 2014-09-17: qty 1

## 2014-09-17 MED ORDER — HYDROCODONE-ACETAMINOPHEN 5-325 MG PO TABS
1.0000 | ORAL_TABLET | Freq: Four times a day (QID) | ORAL | Status: DC | PRN
Start: 2014-09-17 — End: 2014-09-18
  Administered 2014-09-18 (×2): 2 via ORAL
  Filled 2014-09-17 (×2): qty 2

## 2014-09-17 MED ORDER — INSULIN ASPART 100 UNIT/ML ~~LOC~~ SOLN: 0.0000 [IU] | Freq: Every day | SUBCUTANEOUS | Status: DC

## 2014-09-17 MED ORDER — METOPROLOL TARTRATE 12.5 MG HALF TABLET
12.5000 mg | ORAL_TABLET | Freq: Two times a day (BID) | ORAL | Status: DC
  Administered 2014-09-17 – 2014-09-18 (×2): 12.5 mg via ORAL
  Filled 2014-09-17 (×3): qty 1

## 2014-09-17 MED ORDER — HYDROCODONE-HOMATROPINE 5-1.5 MG/5ML PO SYRP
5.0000 mL | ORAL_SOLUTION | Freq: Four times a day (QID) | ORAL | Status: DC | PRN
  Administered 2014-09-17 – 2014-09-18 (×3): 5 mL via ORAL
  Filled 2014-09-17 (×3): qty 5

## 2014-09-17 MED ORDER — CARVEDILOL 6.25 MG PO TABS
6.2500 mg | ORAL_TABLET | Freq: Two times a day (BID) | ORAL | Status: DC
  Administered 2014-09-17: 6.25 mg via ORAL
  Filled 2014-09-17 (×3): qty 1

## 2014-09-17 NOTE — Progress Notes (Signed)
SATURATION QUALIFICATIONS: (This note is used to comply with regulatory documentation for home oxygen)  Patient Saturations on Room Air at Rest = 98%  Patient Saturations on Room Air while Ambulating = 97%  Patient Saturations on 0 Liters of oxygen while Ambulating = 97  Please briefly explain why patient needs home oxygen:    Roselyn Reef Tri [place],RN

## 2014-09-17 NOTE — Progress Notes (Signed)
PULMONARY / CRITICAL CARE MEDICINE   Name: [name] MRN: [id number] DOB: 07-23-1951    ADMISSION DATE:  09/11/2014 CONSULTATION DATE:  09/12/2014  REFERRING MD:  Dr. Johnnye Sima (Internal Medicine Teaching Service)  CHIEF COMPLAINT:  Acute respiratory failure  INITIAL PRESENTATION: 63yo F with hx COPD, tobacco abuse, DM2, HTN presented to ED 11/4 with AECOPD now with acute respiratory failure. Pt recent COPD hospitalization 10/5-10/6.   SIGNIFICANT EVENTS: 11/05 intubated for acute hypercarbic respiratory failure 11/06 hypotensive req bolus, PRN sedation, increased BDs. RVP rhinovirus positive 11/7 extubated 11/8 transferred to medical ward   SUBJECTIVE:  11/10: Feels better today, still complains of some wheeze and DOE with ambulation to bathroom. Also complains of some mild paresthesia to fingers.  VITAL SIGNS: Temp:  [98 F (36.7 C)-98.3 F (36.8 C)] 98.3 F (36.8 C) (11/10 0454) Pulse Rate:  [88-98] 98 (11/10 0454) Resp:  [18-20] 18 (11/10 0454) BP: (170-187)/(68-88) 187/88 mmHg (11/10 0454) SpO2:  [93 %-98 %] 95 % (11/10 0831) 2 liters    INTAKE / OUTPUT:  Intake/Output Summary (Last 24 hours) at 09/17/14 1012 Last data filed at 09/17/14 0946  Gross per 24 hour  Intake    120 ml  Output    500 ml  Net   -380 ml    PHYSICAL EXAMINATION: General:  Obese AA Female, appears stated age Neuro:  Alert, oriented, non-focal HEENT:  NCAT, no JVD noted, RU tooth roots concerning  Cardiovascular:  rrr no MRG Lungs:  Prolonged insp and exp wheezes bilaterally + VCD component Abdomen:  Obese, soft, nontender, bowel sounds present Musculoskeletal:  Trace edema LE, feet/hands WWP. No acute deformity Skin:  No rashes  LABS:  CBC  Recent Labs Lab 09/14/14 0421 09/15/14 0405 09/16/14 0455  HGB 11.5* 12.0 11.8*  HCT 36.1 37.5 36.5  WBC 8.5 8.8 9.3  PLT 310 321 300    CHEMISTRY  Recent Labs Lab 09/13/14 0430 09/14/14 0421 09/15/14 0405 09/16/14 0455  09/17/14 0505  NA 140 140 144 139 139  K 4.6 4.4 4.1 4.1 4.2  CL 100 99 103 100 99  CO2 29 29 30 29 29   GLUCOSE 215* 164* 165* 202* 246*  BUN 15 16 20 23 17   CREATININE 0.56 0.57 0.56 0.61 0.59  CALCIUM 8.5 8.6 8.5 8.4 8.6  MG  --  2.3 2.3 2.4  --   PHOS  --  3.6 3.5 3.4  --    ENDOCRINE CBG (last 3)   Recent Labs  09/17/14 0018 09/17/14 0420 09/17/14 0754  GLUCAP 181* 225* 177*   IMAGING x48h No results found.  ASSESSMENT / PLAN:  Acute hypercarbic respiratory failure r/t AECOPD secondary to rhinovirus Element of vocal cord dysfxn -09/14/14 extubated Still w/ upper airway component  REC:   O2 as needed Budesonide neb BID Duoneb q4hr Solumedrol  changed to q12h 11/8, not ready to decrease again SLP following  Cont BID PPI Add flonase and Claritin  levaquin 11/6>11/10 D/c lisinopril, ok to continue HCTZ Will need dental eval out-pt.. Prob needs at least 2 extractions.    HTN REC:  low dose lopressor and d/c coreg  Georgann Housekeeper, ACNP [facility] Pulmonology/Critical Care Pager [phone] or [phone]    PCCM ATTENDING: I have interviewed and examined the patient and reviewed the database. I have formulated the assessment and plan as reflected in the note above with amendments made by me.   Appears much improved with minimal wheeze I have ordered removal of CVL  PCCM will sign off. Please call if we can be of further assistance  Merton Border, MD;  PCCM service; Mobile [phone]

## 2014-09-17 NOTE — Progress Notes (Signed)
SLP Cancellation Note  Patient Details Name: [name] MRN: [id number] DOB: [birth year]   Cancelled treatment:       Reason Eval/Treat Not Completed: Other (comment). SLP orders received for voice therapy for this patient, with concern for vocal cord dysfunction. Pt is s/p intubation with concern for possible dysfunction prior to ETT placement. Vocal quality during bedside swallow evaluation with SLP s/p extubation was documented as clear. Per discussion with Dr. Marvel Plan, direct visualization of the vocal cords is needed prior to engaging in voice therapy, with potential medical diagnoses and clearance to initiate voice therapy to come from MD. Dr. Marvel Plan shared that at this time she is not certain if the patient will require direct laryngoscopy, and that the patient may benefit from ENT and SLP referrals on an outpatient basis.  SLP to hold off on evaluation at this time per discussion with MD. Please re-order SLP as needed for further voice assessment while patient remains in house.    Germain Osgood, M.A. CCC-SLP [phone]  Germain Osgood 09/17/2014, 4:13 PM

## 2014-09-17 NOTE — Progress Notes (Signed)
Inpatient Diabetes Program Recommendations  AACE/ADA: New Consensus Statement on Inpatient Glycemic Control (2013)  Target Ranges:  Prepandial:   less than 140 mg/dL      Peak postprandial:   less than 180 mg/dL (1-2 hours)      Critically ill patients:  140 - 180 mg/dL    Inpatient Diabetes Program Recommendations Insulin - Basal: Since pt is requiring q 4 hr correction, approximately 12 units each night during the night/early am.  Please consider adding 10-12 units [email or web address].[email or web address] to cover those needs. Correction (SSI): Please use correction scale tidwc and HS scale  Insulin - Meal Coverage: Please consider addition of meal coverage of 4 units tidwc  Thank you, Rosita Kea, RN, CNS, Diabetes Coordinator [phone])

## 2014-09-17 NOTE — Progress Notes (Signed)
CARE MANAGEMENT NOTE 09/17/2014  Patient:  [name]   Account Number:  [id number]  Date Initiated:  09/11/2014  Documentation initiated by:  HUTCHINSON,CRYSTAL  Subjective/Objective Assessment:   COPD     Action/Plan:   CM to follow for disposition needs   Anticipated DC Date:  09/18/2014   Anticipated DC Plan:  [place]  CM consult      [facility]  HOME HEALTH   Choice offered to / List presented to:  C-1 Patient        [place] arranged  HH-2 PT  HH-1 RN  HH-3 OT      [place]   Status of service:  Completed, signed off Medicare Important Message given?  YES (If response is "NO", the following Medicare IM given date fields will be blank) Date Medicare IM given:  09/16/2014 Medicare IM given by:  Tomi Bamberger Date Additional Medicare IM given:   Additional Medicare IM given by:    Discharge Disposition:  [place]  Per UR Regulation:  Reviewed for med. necessity/level of care/duration of stay  If discussed at [place] of Stay Meetings, dates discussed:    Comments:  09/17/14 [place], BSN [place] was added to services.  09/16/2014 1600 NCM spoke to pt and requested [employer] for [facility]. Pt requesting RW. Will recheck oxygen sat for home oxygen. Jonnie Finner RN CCM Case Mgmt phone [phone]  09/16/2014 1500 NCM spoke to pt and she lives at home with husband. She able to purchase her medications. Offered choice for [facility]. Explained she may need oxygen for home. Will have Unit RN check oxygen sats on RA while ambulating. She has nebulizer at home. Pt states she will decide if RW is needed. Jonnie Finner RN CCM Case Mgmt phone [phone]  Mariann Laster RN, BSN, MSHL, CCM  Nurse - Case Manager,  (Unit [facility])  [phone]  09/11/2014 Hx/o 3 admissions and 2 ER visits over past 6 months. ADM:  COPD Social:  From home with spouse. Smoker:   yes THN:  active and gives handoff that patient self manages her care and in denial re:  smoking contributes to poor health and believes causes r/t medications. Dispo Plan:  Home / [place] CM will continue to monitor for needs as indicated.

## 2014-09-17 NOTE — Progress Notes (Addendum)
Subjective:  Patient was seen and examined this morning. Patient states she feels that she is improving, but she continues to have shortness of breath with ambulation and continues to have wheezing. She also has a non-productive cough and rhinorrhea. Her ribs and throat are sore from coughing. She states that her anxiety has been bad surrounding her breathing and she is fearful of a re-exacerbation when she goes home. Patient is going to quit smoking and avoid triggers that could worsen her breathing.   Objective: Vital signs in last 24 hours: Filed Vitals:   09/17/14 0214 09/17/14 0454 09/17/14 0831 09/17/14 1110  BP:  187/88    Pulse:  98    Temp:  98.3 F (36.8 C)    TempSrc:  Oral    Resp:  18    Height:      Weight:      SpO2: 97% 93% 95% 96%   Weight change:   Intake/Output Summary (Last 24 hours) at 09/17/14 1536 Last data filed at 09/17/14 0946  Gross per 24 hour  Intake    120 ml  Output    500 ml  Net   -380 ml   General: Vital signs reviewed.  Patient is well-developed and well-nourished, in no acute distress and cooperative with exam.  Cardiovascular:  Regular rate, regular rhythm, S1 normal, S2 normal, no murmurs, gallops, or rubs. Pulmonary/Chest: Mild expiratory wheeze in upper lung fields, no rhonchi, no rales. No increased work of breathing. Abdominal: Soft, obese, non-tender, non-distended, BS +, no masses, organomegaly, or guarding present.  Musculoskeletal: No joint deformities, erythema, or stiffness, ROM full and nontender. Extremities: No lower extremity edema bilaterally,  pulses symmetric and intact bilaterally. No cyanosis or clubbing. Neurological: A&O x3. Skin: Warm, dry and intact. No rashes or erythema. Psychiatric: Normal mood and affect. Speech and behavior is normal. Cognition and memory are normal.   Lab Results: Basic Metabolic Panel:  Recent Labs Lab 09/15/14 0405 09/16/14 0455 09/17/14 0505  NA 144 139 139  K 4.1 4.1 4.2  CL 103  100 99  CO2 _0 GLUCOSE 165* 202* 246*  BUN _1 CREATININE 0.56 0.61 0.59  CALCIUM 8.5 8.4 8.6  MG 2.3 2.4  --   PHOS 3.5 3.4  --    CBC:  Recent Labs Lab 09/15/14 0405 09/16/14 0455  WBC 8.8 9.3  HGB 12.0 11.8*  HCT 37.5 36.5  MCV 88.9 88.4  PLT 321 300   Urine Drug Screen: Drugs of Abuse     Component Value Date/Time   LABOPIA NONE DETECTED 12/29/2009 0633   COCAINSCRNUR NONE DETECTED 12/29/2009 [place] DETECTED 12/29/2009 0633   AMPHETMU NONE DETECTED 12/29/2009 0633   THCU NONE DETECTED 12/29/2009 0633   LABBARB  12/29/2009 0633    NONE DETECTED        DRUG SCREEN FOR MEDICAL PURPOSES ONLY.  IF CONFIRMATION IS NEEDED FOR ANY PURPOSE, NOTIFY LAB WITHIN 5 DAYS.        LOWEST DETECTABLE LIMITS FOR URINE DRUG SCREEN Drug Class       Cutoff (ng/mL) Amphetamine      1000 Barbiturate      200 Benzodiazepine   496 Tricyclics       759 Opiates          300 Cocaine          300 THC              50  Studies/Results: No results found. Medications:  I have reviewed the patient's current medications. Prior to Admission:  Prescriptions prior to admission  Medication Sig Dispense Refill Last Dose  . ACCU-CHEK FASTCLIX LANCETS MISC 1 each by Other route See admin instructions. Check blood sugar daily as needed for high blood sugar.   09/10/2014 at Unknown time  . albuterol (PROVENTIL) (2.5 MG/3ML) 0.083% nebulizer solution Take 3 mLs (2.5 mg total) by nebulization every 6 (six) hours as needed for wheezing or shortness of breath. 1 vial 0 09/11/2014 at Unknown time  . albuterol (VENTOLIN HFA) 108 (90 BASE) MCG/ACT inhaler Inhale 2 puffs into the lungs every 6 (six) hours as needed for wheezing or shortness of breath. 1 Inhaler 3 09/11/2014 at Unknown time  . Blood Glucose Monitoring Suppl (ACCU-CHEK NANO SMARTVIEW) W/DEVICE KIT 1 each by Other route See admin instructions. Check blood sugar daily as needed for high blood sugar.   09/10/2014 at  Unknown time  . budesonide-formoterol (SYMBICORT) 80-4.5 MCG/ACT inhaler Inhale 2 puffs into the lungs 2 (two) times daily as needed. For asthma (Patient taking differently: Inhale 2 puffs into the lungs 2 (two) times daily. For asthma) 1 Inhaler 12 09/10/2014 at Unknown time  . carvedilol (COREG) 6.25 MG tablet Take 6.25 mg by mouth 2 (two) times daily with a meal.   09/10/2014 at 2100  . citalopram (CELEXA) 40 MG tablet Take 40 mg by mouth daily.   09/10/2014 at Unknown time  . fluticasone (FLONASE) 50 MCG/ACT nasal spray Place 1 spray into both nostrils daily as needed for allergies. (Patient taking differently: Place 1 spray into both nostrils daily. ) 16 g 2 09/10/2014 at Unknown time  . glucose blood (ACCU-CHEK SMARTVIEW) test strip 1 each by Other route See admin instructions. Check blood sugar daily as needed for high blood sugar.   09/10/2014 at Unknown time  . ipratropium-albuterol (DUONEB) 0.5-2.5 (3) MG/3ML SOLN Take 3 mLs by nebulization every 4 (four) hours as needed. (Patient taking differently: Take 3 mLs by nebulization every 4 (four) hours as needed (shortness of breath). ) 360 mL 0 09/11/2014 at Unknown time  . lisinopril-hydrochlorothiazide (PRINZIDE,ZESTORETIC) 20-25 MG per tablet Take 1 tablet by mouth daily.   09/10/2014 at Unknown time  . metFORMIN (GLUCOPHAGE) 1000 MG tablet Take 1 tablet (1,000 mg total) by mouth 2 (two) times daily with a meal. 60 tablet 1 09/10/2014 at Unknown time  . pravastatin (PRAVACHOL) 40 MG tablet Take 40 mg by mouth daily.   09/10/2014 at Unknown time  . sodium chloride (OCEAN) 0.65 % SOLN nasal spray Place 1 spray into both nostrils as needed for congestion. (Patient taking differently: Place 1 spray into both nostrils daily as needed for congestion. ) 15 mL 3 09/10/2014 at Unknown time  . benzonatate (TESSALON) 100 MG capsule Take 1 capsule (100 mg total) by mouth 3 (three) times daily. 20 capsule 0   . cyclobenzaprine (FLEXERIL) 5 MG tablet TAKE ONE TABLET BY  MOUTH AT BEDTIME AS NEEDED FOR MUSCLE SPASMS 30 tablet 0   . Dextromethorphan-Guaifenesin (ROBITUSSIN SUGAR FREE) 10-100 MG/5ML liquid Take 5 mLs by mouth every 12 (twelve) hours. 120 mL 2   . sulfamethoxazole-trimethoprim (BACTRIM DS) 800-160 MG per tablet Take 1 tablet by mouth 2 (two) times daily. 10 tablet 0   . tiotropium (SPIRIVA) 18 MCG inhalation capsule Place 18 mcg into inhaler and inhale daily.   08/12/2014 at Unknown time   Scheduled Meds: . budesonide (PULMICORT) nebulizer solution  0.5 mg Nebulization  BID  . citalopram  20 mg Per Tube Daily  . fluticasone  2 spray Each Nare BID  . heparin  5,000 Units Subcutaneous 3 times per day  . hydrochlorothiazide  25 mg Oral Daily  . insulin aspart  0-15 Units Subcutaneous TID WC  . insulin aspart  0-5 Units Subcutaneous QHS  . ipratropium-albuterol  3 mL Nebulization Q4H  . loratadine  10 mg Oral Daily  . methylPREDNISolone (SOLU-MEDROL) injection  60 mg Intravenous Q12H  . metoprolol tartrate  12.5 mg Oral BID  . pantoprazole  40 mg Oral BID AC  . sodium chloride  3 mL Intravenous Q12H  . zolpidem  5 mg Oral QHS   Continuous Infusions: . sodium chloride 10 mL/hr (09/16/14 1956)   PRN Meds:.[CANCELED] Place/Maintain arterial line **AND** sodium chloride, sodium chloride, albuterol, HYDROcodone-homatropine, sodium chloride Assessment/Plan: Active Problems:   COPD exacerbation   Acute respiratory failure   Endotracheally intubated   Rhinovirus infection   Vocal cord dysfunction  Acute on Chronic COPD exacerbation 2/2 to Rhinovirus: Patient is slowly improving, but continues to have shortness of breath with ambulation, mild expiratory wheezes in upper lung fields and is using 1-2 L of oxygen via [place]. Patient completed Levaquin course today. -DuoNeb nebulizer Q4H -Methylprednisolone 60 mg IV BID -Albuterol nebs Q2H prn -Flonase 2 spray daily  -Claritin 10 mg daily -Hycodan 5-1.5 mg Q6H prn -Pulmicort (budesonide) nebulizer  BID -Consider ENT consult if wheeze persists -Protonix 40 mg BID -Wean O2 -PT -Diet Carb Modified -Cardiac Monitor -Ambulatory sats -Incentive spirometry  Possible Vocal Cord Dysfunction: Patient has audible expiratory wheezes in the upper lung fields. She does not have stridor in her anterior neck and although wheezes are diminished in the rest of her lung fields. It is not monosyllabic sounding. She has risk factors for VCD including exposures to irritants (cigarette smoke and smoke from grilling), anxiety, GERD and she is post-extubation. Gold standard for diagnosis is laryngoscopy. Patietn could be evaluated using PFTs which would show inspiratory loop of flow-volume curve flattening and raio of forced expiratory flow to forced inspiratory flow at 50% VC >1, or it could be normal. Classic signs include inspiratory stridor with respiratory distress loudest over anterior neck and less audible throughout chest wall, choking sensation and more difficulty with inspiration versus expiration. Patient denies choking sensation, patient is unsure if panting helps her breathing when we try together, wheezes unchanged during panting. She also admits to more difficulty with expiration than inspiration. Acute management includes reassurance, having patient pant and CPAP. Long term management is minimizing laryngeal irritation and treatment with speech and language pathologist. I think the diagnosis is less likely in the patient given her symptoms, but we will have SLP see her. If concerned for VCD, we could diagnosis with PFTs and/or laryngoscopy -SLP eval and treat -Patient education -Consider PFTs outpatient -Consider consult to ENT if patient has not improved for laryngoscopy -Encourage patient to pant during episodes  Hypokalemia: Resolved. Potassium 4.2. -Stable  HTN: Patient is on lisinopril-hydrochlorothiazide 20-15 mg daily and Coreg 6.25 mg BID at home. Patient was hypertensive at 187/88 this  morning on metoprolol 12.5 mg BID. Lisinopril contraindicated in VCD. I will add back her home HCTZ 15 mg daily. -HCTZ 15 mg daily -Metoprolol 12.5 mg BID  DM Type II: Blood sugars indicate glucose 160s-220s. HgA1c 6.8% on 06/14/2014. On metformin 500 mg BID at home.  -SSI-moderate with QHS converage -CBG monitoring with meals and QHS -Carb modified diet  Depression and  Anxiety: Improved. Patient is on Celexa 20 mg daily at home.  -Continue Celexa 20 mg daily  DVT/PE ppx: Heparin SQ TID  Dispo: Disposition is deferred at this time, awaiting improvement of current medical problems.  Anticipated discharge in approximately 1-2 day(s).   The patient does have a current PCP (Jones Bales, MD) and does need an [facility] hospital follow-up appointment after discharge.  The patient does not have transportation limitations that hinder transportation to clinic appointments.  .Services Needed at time of discharge: Y = Yes, Blank = No PT:   OT:   RN:   Equipment:   Other:     LOS: 6 days   Osa Craver, DO PGY-1 Internal Medicine Resident Pager # [phone] 09/17/2014 3:36 PM

## 2014-09-17 NOTE — Progress Notes (Signed)
  Date: 09/17/2014  Patient name: [name]  Medical record number: [id number]  Date of birth: Mar 27, 1951   This patient has been seen and the plan of care was discussed with the house staff. Please see their note for complete details. I concur with their findings with the following additions/corrections:  COPD Rhinovirus Acute respiratory failure  Will continue her steroids, consider change to po ENT eval, possibly as outpt Has completed 7 days of anbx She is going to quit smoking   Campbell Riches, MD 09/17/2014, 2:23 PM

## 2014-09-18 DIAGNOSIS — I82409 Acute embolism and thrombosis of unspecified deep veins of unspecified lower extremity: Secondary | ICD-10-CM

## 2014-09-18 DIAGNOSIS — I2699 Other pulmonary embolism without acute cor pulmonale: Secondary | ICD-10-CM

## 2014-09-18 LAB — GLUCOSE, CAPILLARY
GLUCOSE-CAPILLARY: 208 mg/dL — AB (ref 70–99)
Glucose-Capillary: 194 mg/dL — ABNORMAL HIGH (ref 70–99)
Glucose-Capillary: 293 mg/dL — ABNORMAL HIGH (ref 70–99)

## 2014-09-18 MED ORDER — PANTOPRAZOLE SODIUM 40 MG PO TBEC: 40.0000 mg | DELAYED_RELEASE_TABLET | Freq: Every day | ORAL | Status: DC

## 2014-09-18 MED ORDER — HYDROCHLOROTHIAZIDE 25 MG PO TABS: 25.0000 mg | ORAL_TABLET | Freq: Every day | ORAL | Status: DC

## 2014-09-18 MED ORDER — PREDNISONE 20 MG PO TABS: 20.0000 mg | ORAL_TABLET | Freq: Every day | ORAL | Status: DC

## 2014-09-18 MED ORDER — HYDROCODONE-HOMATROPINE 5-1.5 MG/5ML PO SYRP: 5.0000 mL | ORAL_SOLUTION | Freq: Four times a day (QID) | ORAL | Status: DC | PRN

## 2014-09-18 MED ORDER — METOPROLOL TARTRATE 25 MG PO TABS: 12.5000 mg | ORAL_TABLET | Freq: Two times a day (BID) | ORAL | Status: DC

## 2014-09-18 NOTE — Progress Notes (Signed)
Inpatient Diabetes Program Recommendations  AACE/ADA: New Consensus Statement on Inpatient Glycemic Control (2013)  Target Ranges:  Prepandial:   less than 140 mg/dL      Peak postprandial:   less than 180 mg/dL (1-2 hours)      Critically ill patients:  140 - 180 mg/dL  Inpatient Diabetes Program Recommendations Insulin - Basal: Please consider addition of basal lantus/levemir of 10-12 units while on solumedrol. Correction (SSI): xxxxxx Insulin - Meal Coverage: Please consider addition of meal coverage of 4 units tidwc  Thank you, Rosita Kea, RN, CNS, Diabetes Coordinator [phone])

## 2014-09-18 NOTE — Progress Notes (Signed)
DR. Denton Brick called back & asked to check again her V/S & HR=102;02 SAT =97 % on RA;B/P=122/67;& DR.Emokpae said it is ok to d/c her.

## 2014-09-18 NOTE — Plan of Care (Signed)
Problem: Acute Rehab PT Goals(only PT should resolve) Goal: Pt Will Go Supine/Side To Sit Outcome: Completed/Met Date Met:  09/18/14 Goal: Pt Will Go Sit To Supine/Side Outcome: Completed/Met Date Met:  09/18/14

## 2014-09-18 NOTE — Plan of Care (Signed)
Problem: Discharge Progression Outcomes Goal: Flu vaccine received if indicated Outcome: Not Applicable Date Met:  47/84/12 Goal: Pneumonia vaccine received if indicated Outcome: Not Applicable Date Met:  82/08/13

## 2014-09-18 NOTE — Progress Notes (Signed)
Physical Therapy Treatment Patient Details Name: [name] MRN: [id number] DOB: Sep 18, 1951 Today's Date: 09/18/2014    History of Present Illness  5F with hx COPD, tobacco abuse, DM2, and hypertension presenting to ED 11/4 with COPD exacerbation now with acute respiratory failure.    PT Comments    Patient is progressing towards physical therapy goals, ambulating up to 275 feet today. Moderate instability when ambulating without an assistive device and greatly improves safety and balance with use of a rolling walker. Has successfully completed stair training with no further questions. Patient will continue to benefit from skilled physical therapy services at home with HHPT to further improve independence with functional mobility.   Follow Up Recommendations  Home health PT;Supervision/Assistance - 24 hour     Equipment Recommendations  Rolling walker with 5" wheels    Recommendations for Other Services OT consult     Precautions / Restrictions Precautions Precautions: Fall Precaution Comments: watch O2 sats Restrictions Weight Bearing Restrictions: No    Mobility  Bed Mobility Overal bed mobility: Modified Independent                Transfers Overall transfer level: Needs assistance Equipment used: None Transfers: Sit to/from Stand Sit to Stand: Min guard         General transfer comment: Min guard for safety. Reaches for furniture but good stability once upright. VC for hand palcement.  Ambulation/Gait Ambulation/Gait assistance: Min guard Ambulation Distance (Feet): 275 Feet Assistive device: Rolling walker (2 wheeled);None Gait Pattern/deviations: Step-through pattern;Decreased stride length Gait velocity: decreased Gait velocity interpretation: Below normal speed for age/gender General Gait Details: Short distance of ambulation without an assistive device demonstrating moderate sway and loss of balance but able to self correct using sing for  stability, states she feels a little "off from my medicine."  Improved balance significantly with use of a rolling walker. Pt on room air throughout ambulatory bout. Demonstrates good control of rolling walker. SpO2 maintained 93% and greater. Min guard for safety.   Stairs Stairs: Yes Stairs assistance: Min assist (Hand-held assist) Stair Management: No rails;Step to pattern;Forwards Number of Stairs: 1 General stair comments: Hand held assist similar to how pt reports she navigates stairs at home with her husband. No loss of balance noted. States she feels confident with this task.  Wheelchair Mobility    Modified Rankin (Stroke Patients Only)       Balance                                    Cognition Arousal/Alertness: Awake/alert Behavior During Therapy: WFL for tasks assessed/performed Overall Cognitive Status: Within Functional Limits for tasks assessed                      Exercises      General Comments        Pertinent Vitals/Pain Pain Assessment: 0-10 Pain Score:  ("just some rib pain, but that's old" no value given) Pain Location: Ribs Pain Intervention(s): Monitored during session;Repositioned  SpO2 maintained 93% and greater during therapy while on room air    Home Living                      Prior Function            PT Goals (current goals can now be found in the care plan section) Acute Rehab PT Goals Patient Stated  Goal: to go back to my family PT Goal Formulation: With patient Time For Goal Achievement: 09/22/14 Potential to Achieve Goals: Good Progress towards PT goals: Progressing toward goals    Frequency  Min 3X/week    PT Plan Current plan remains appropriate    Co-evaluation             End of Session Equipment Utilized During Treatment: Gait belt Activity Tolerance: Patient tolerated treatment well Patient left: in bed;with call bell/phone within reach     Time: 1551-1610 PT Time  Calculation (min) (ACUTE ONLY): 19 min  Charges:  $Gait Training: 8-22 mins                    G Codes:      [name] 2014/10/06, 4:55 PM  [name] [place], [place]

## 2014-09-18 NOTE — Discharge Summary (Signed)
Name: [name] MRN: [id number] DOB: 02/06/1951 63 y.o. PCP: Jones Bales, MD  Date of Admission: 09/11/2014  3:40 AM Date of Discharge: 09/19/2014 Attending Physician: No att. providers found  Discharge Diagnosis:  Active Problems:   COPD exacerbation   Acute respiratory failure   Endotracheally intubated   Rhinovirus infection   Vocal cord dysfunction  Discharge Medications:   Medication List    STOP taking these medications        carvedilol 6.25 MG tablet  Commonly known as:  COREG     lisinopril-hydrochlorothiazide 20-25 MG per tablet  Commonly known as:  PRINZIDE,ZESTORETIC     sulfamethoxazole-trimethoprim 800-160 MG per tablet  Commonly known as:  BACTRIM DS      TAKE these medications        ACCU-CHEK FASTCLIX LANCETS Misc  1 each by Other route See admin instructions. Check blood sugar daily as needed for high blood sugar.     ACCU-CHEK NANO SMARTVIEW W/DEVICE Kit  1 each by Other route See admin instructions. Check blood sugar daily as needed for high blood sugar.     ACCU-CHEK SMARTVIEW test strip  Generic drug:  glucose blood  1 each by Other route See admin instructions. Check blood sugar daily as needed for high blood sugar.     albuterol (2.5 MG/3ML) 0.083% nebulizer solution  Commonly known as:  PROVENTIL  Take 3 mLs (2.5 mg total) by nebulization every 6 (six) hours as needed for wheezing or shortness of breath.     albuterol 108 (90 BASE) MCG/ACT inhaler  Commonly known as:  VENTOLIN HFA  Inhale 2 puffs into the lungs every 6 (six) hours as needed for wheezing or shortness of breath.     benzonatate 100 MG capsule  Commonly known as:  TESSALON  Take 1 capsule (100 mg total) by mouth 3 (three) times daily.     budesonide-formoterol 80-4.5 MCG/ACT inhaler  Commonly known as:  SYMBICORT  Inhale 2 puffs into the lungs 2 (two) times daily as needed. For asthma     citalopram 40 MG tablet  Commonly known as:  CELEXA  Take 40  mg by mouth daily.     cyclobenzaprine 5 MG tablet  Commonly known as:  FLEXERIL  TAKE ONE TABLET BY MOUTH AT BEDTIME AS NEEDED FOR MUSCLE SPASMS     Dextromethorphan-Guaifenesin 10-100 MG/5ML liquid  Commonly known as:  ROBITUSSIN SUGAR FREE  Take 5 mLs by mouth every 12 (twelve) hours.     fluticasone 50 MCG/ACT nasal spray  Commonly known as:  FLONASE  Place 1 spray into both nostrils daily as needed for allergies.     hydrochlorothiazide 25 MG tablet  Commonly known as:  HYDRODIURIL  Take 1 tablet (25 mg total) by mouth daily.     HYDROcodone-homatropine 5-1.5 MG/5ML syrup  Commonly known as:  HYCODAN  Take 5 mLs by mouth every 6 (six) hours as needed for cough.     ipratropium-albuterol 0.5-2.5 (3) MG/3ML Soln  Commonly known as:  DUONEB  Take 3 mLs by nebulization every 4 (four) hours as needed.     metFORMIN 1000 MG tablet  Commonly known as:  GLUCOPHAGE  Take 1 tablet (1,000 mg total) by mouth 2 (two) times daily with a meal.     metoprolol tartrate 25 MG tablet  Commonly known as:  LOPRESSOR  Take 0.5 tablets (12.5 mg total) by mouth 2 (two) times daily.     pantoprazole 40 MG tablet  Commonly known as:  PROTONIX  Take 1 tablet (40 mg total) by mouth daily.     pravastatin 40 MG tablet  Commonly known as:  PRAVACHOL  Take 40 mg by mouth daily.     predniSONE 20 MG tablet  Commonly known as:  DELTASONE  Take 1 tablet (20 mg total) by mouth daily with breakfast. 60 mg (3 tablets) for 3 days. 40 mg (2 tablets) for 3 days. 20 mg (1 tablet) for 3 days.     sodium chloride 0.65 % Soln nasal spray  Commonly known as:  OCEAN  Place 1 spray into both nostrils as needed for congestion.     tiotropium 18 MCG inhalation capsule  Commonly known as:  SPIRIVA  Place 18 mcg into inhaler and inhale daily.        Disposition and follow-up:   [name] was discharged from [facility] in Good condition.  At the hospital follow up visit  please address:  1.  COPD: Please assess resolution of shortness of breath, wheezing and productive cough. Please assess compliance with medications.   2.  Labs / imaging needed at time of follow-up: PFTs, Consider ENT referral for laryngoscopy for vocal cord dysfunction (see below)  3.  Pending labs/ test needing follow-up: None  Follow-up Appointments: Follow-up Information    Follow up with [facility].   Why:  Home Health Physical Therapy, RN and Occupational Therapy   Contact information:   [address] [place] [address] [phone]       Follow up with Lottie Mussel, MD On 09/25/2014.   Specialty:  Internal Medicine   Why:  1:30 pm   Contact information:   [place] [address] [phone]       Follow up with Christinia Gully, MD On 09/27/2014.   Specialty:  Pulmonary Disease   Why:  10:15 am   Contact information:   [address] [address] [phone]       Discharge Instructions: Discharge Instructions    Call MD for:  difficulty breathing, headache or visual disturbances    Complete by:  As directed      Call MD for:  extreme fatigue    Complete by:  As directed      Call MD for:  persistant dizziness or light-headedness    Complete by:  As directed      Call MD for:  severe uncontrolled pain    Complete by:  As directed      Call MD for:  temperature >100.4    Complete by:  As directed      Diet - low sodium heart healthy    Complete by:  As directed      Increase activity slowly    Complete by:  As directed            Consultations:   PCCM  Procedures Performed:  Dg Chest 2 View (if Patient Has Fever And/or Copd)  09/11/2014   CLINICAL DATA:  Shortness of breath for several days.  EXAM: CHEST  2 VIEW  COMPARISON:  09/03/2014  FINDINGS: Normal heart size and pulmonary vascularity. Emphysematous changes and slight fibrosis in the lungs. No focal airspace disease or consolidation. No blunting of  costophrenic angles. No pneumothorax. Postoperative changes in the cervical spine. Surgical clips in the right axilla.  IMPRESSION: No active cardiopulmonary disease.  Emphysematous changes.   Electronically Signed   By: Gwyndolyn Saxon  Gerilyn Nestle M.D.   On: 09/11/2014 04:56   Dg Chest 2 View  09/03/2014   CLINICAL DATA:  Chronic obstructive pulmonary disease with exacerbation. Shortness of breath.  EXAM: CHEST  2 VIEW  COMPARISON:  August 12, 2014.  FINDINGS: The heart size and mediastinal contours are within normal limits. Both lungs are clear. No pneumothorax or pleural effusion is noted. Surgical clips are noted in right axillary region. Stable emphysematous changes are noted. The visualized skeletal structures are unremarkable.  IMPRESSION: No acute cardiopulmonary abnormality seen.   Electronically Signed   By: Sabino Dick M.D.   On: 09/03/2014 12:35   Dg Chest Port 1 View  09/14/2014   CLINICAL DATA:  Intubation.  EXAM: PORTABLE CHEST - 1 VIEW  COMPARISON:  09/13/2014  FINDINGS: There is a right IJ catheter in stable position. Endotracheal and orogastric tubes are in good position.  Normal heart size and mediastinal contours for technique.  Unchanged appearance of generous lung volumes with coarse markings at the bases. No superimposed effusion or pneumothorax. Right axillary dissection.  IMPRESSION: 1. Tubes and central line are in unchanged position. 2. COPD with stable aeration.   Electronically Signed   By: Jorje Guild M.D.   On: 09/14/2014 07:19   Dg Chest Port 1 View  09/13/2014   CLINICAL DATA:  Respiratory failure  EXAM: PORTABLE CHEST - 1 VIEW  COMPARISON:  09/12/2014  FINDINGS: Cardiac shadow is stable. A right jugular line is again seen in the mid superior vena cava. Endotracheal tube and nasogastric catheter are noted. The endotracheal tube lies 4.7 cm above the carina. No focal infiltrate or sizable effusion is seen. Old rib fractures are noted on the right.  IMPRESSION: Tubes and lines as  described above. The overall appearance is stable from the prior exam.   Electronically Signed   By: Inez Catalina M.D.   On: 09/13/2014 07:03   Portable Chest Xray  09/12/2014   CLINICAL DATA:  Acute respiratory failure  EXAM: PORTABLE CHEST - 1 VIEW  COMPARISON:  09/11/2014  FINDINGS: Interval placement of endotracheal tube in good position. Interval placement of right jugular catheter tip in the upper SVC. No pneumothorax. NG tube enters the stomach with the tip not visualized.  Lungs are clear without infiltrate or effusion. COPD noted. Chronic right rib fractures  IMPRESSION: Satisfactory endotracheal tube placement.  Satisfactory central line placement.  COPD.  No acute cardiopulmonary abnormality.   Electronically Signed   By: Franchot Gallo M.D.   On: 09/12/2014 15:20   Dg Abd Portable 1v  09/12/2014   CLINICAL DATA:  NG tube placement.  EXAM: PORTABLE ABDOMEN - 1 VIEW  COMPARISON:  Scout image for CT scan dated 06/28/2007  FINDINGS: NG tube tip is in the body of the stomach. There is moderate air in the stomach. Large and small bowel are not dilated. No acute osseous abnormality.  IMPRESSION: NG tube tip in the body of the stomach.   Electronically Signed   By: Rozetta Nunnery M.D.   On: 09/12/2014 15:26    Admission HPI: Ms. Hendler is a 63 yo woman with a history of COPD, tobacco abuse, type 2 diabetes, depression, and hypertension who presents with dyspnea and productive cough. Patient reports that she has had persistent issues with dyspnea and cough ever since her last hospitalization. She states that she has been compliant with all of her medications including her steroid tapers and completed her course of levofloxacin. However, she states that she really has  never felt quite back to her baseline throughout the last couple weeks. She has had persistent dyspnea and her cough has still been consistently productive of yellow sputum. She was seen by Dr. Gordy Levan last week in clinic. At that point, she  had finished her steroid taper and regimen of Levaquin. Ipratropium nebulizer was added at that point, she was instructed to return to clinic if not improved. Otherwise, she denies any abdominal pain, constipation, diarrhea, nausea, or vomiting. Patient does report some chest wall pain that is associated with her coughing. She states that she is still smoking around 2 cigarettes a day, and has had issues with insurance paying for her routine patches at home.  During her last hospitalization from 08/12/2014 to 08/13/2014, she states that she felt like she was rushed out of the hospital before she is ready to go home. She states that she felt exactly the same at admission as she did at discharge. She also stated that she was quite upset by some of the perceived behavior of medical students involved in her care last time. She requests that no medical students be involved in her care during this hospitalization. She was quite tearful in describing her frustrations with her last hospital admission.  In the emergency department, received DuoNeb nebulizers, 125 mg of Solu-Medrol, and 4 mg of morphine. Patient states that her breathing is a little better, now that she is able to speak in full sentences.   Hospital Course by problem list: Active Problems:   COPD exacerbation   Acute respiratory failure   Endotracheally intubated   Rhinovirus infection   Vocal cord dysfunction  COPD exacerbation 2/2 Rhinovirus: Patient presented with persistently high frequency and severity of cough, sputum production yellow in color, and dyspnea. Patient does not have any PFTs on file. Patient is still a smoker. Chest x-ray shows no evidence of consolidation or edema, no leukocytosis. EKG not concerning for ischemia, and troponin negative x 1. Patient received 125 mg Solu-Medrol and DuoNeb treatments in the ED with mild resolution of initial presenting symptoms. Severity was mild at this point as the patient is satting 99% on  2L with no change in mental status and no cardiac dysrhythmia. Patient has had previous treatments with doxycycline and levofloxacin with persistent symptoms. According to notes from last hospitalization, there is some concern for medication noncompliance, however patient reported that she has been compliant with her medications since her discharge. Patient was treated with Duonebs, solumedrol, vanc/zosyn then de-escalated to cefepime. The following day, patient continued to complain of shortness of breath and a productive cough worsened by her anxiety. Anxiety is mostly revolving around her coughing spells when she feels like she cannot breathe. Coughing spells came on when she ambulated. Patient was satting 100% on 2-3 L, but clinically appeared to be in respiratory stress with change in mental status. Patient continued to have rhonchi and wheezes on exam. She became progressively more fatigued. ABG showed pH 7.349 CO2 58.1 O2 89.3 HCO3 31.2 Base Excess 5.8. Tried patient on biPAP but patiently was unable to tolerate. We then called PCCM, who agreed to transfer to ICU and intubate for respiratory distress and bronchospasm. Patient did well in the ICU and tolerated extubation 2 days later. She came back to our service and continued to improve. She has mild expiratory wheezes on exam, but patient was satting 99% on room air and 97% on room air with ambulation. She was discharged home with nebulizer treatments and a steroid dose taper.  Possible Vocal Cord Dysfunction: Patient has audible expiratory wheezes in the upper lung fields. She does not have stridor in her anterior neck and wheezes are audible in the rest of her lung fields. She has risk factors for VCD including exposures to irritants (cigarette smoke and smoke from grilling), anxiety, GERD and she is post-extubation. Gold standard for diagnosis is laryngoscopy. Patient could be evaluated using PFTs which would show inspiratory loop of flow-volume curve  flattening and raio of forced expiratory flow to forced inspiratory flow at 50% VC >1, or it could be normal. Classic signs include inspiratory stridor with respiratory distress loudest over anterior neck and less audible throughout chest wall, choking sensation and more difficulty with inspiration versus expiration. Acute management includes reassurance, having patient pant and CPAP. Long term management is minimizing laryngeal irritation and treatment with speech and language pathologist. We will suggest outpatient referral to ENT for further evaluation.  HTN: Patient is on lisinopril-hydrochlorothiazide 20-25 mg daily and Coreg 6.25 mg BID prior to admission. Out of concern for VCD and due to her tachycardia, medications were changed to HCTZ 25 mg daily and metoprolol 12.5 mg BID. Patient was still hypertensive and tachycardic on the last day in the hospital so we increased her metoprolol to 25 mg BID on discharge. Please reassess blood pressure and heart rate and adjust medications as necessary.  DM Type II: Patient was hyperglycemia in the 200s during admission, exacerbated by steroid treatments. HgA1c 6.8% on 06/14/2014. She is normally on metformin 500 mg BID at home. We managed her DM with SSI-moderate with QHS coverage in the hospital and discharge her home with her normal metformin 500 mg BID.  Depression and Anxiety: Patient has severe anxiety surrounding her COPD. Patient is on Celexa 40 mg daily at home. We continued her home Celexa during her stay and on discharge.   Discharge Vitals:   BP 113/87 mmHg  Pulse 99  Temp(Src) 98.8 F (37.1 C) (Oral)  Resp 18  Ht 5' 7" (1.702 m)  Wt 98.703 kg (217 lb 9.6 oz)  BMI 34.07 kg/m2  SpO2 98%  Discharge Labs:  Results for orders placed or performed during the hospital encounter of 09/11/14 (from the past 24 hour(s))  Glucose, capillary     Status: Abnormal   Collection Time: 09/18/14 12:52 PM  Result Value Ref Range   Glucose-Capillary 194 (H)  70 - 99 mg/dL  Glucose, capillary     Status: Abnormal   Collection Time: 09/18/14  5:28 PM  Result Value Ref Range   Glucose-Capillary 293 (H) 70 - 99 mg/dL    Signed: Osa Craver, DO PGY-1 Internal Medicine Resident Pager # [phone] 09/19/2014 8:40 AM

## 2014-09-18 NOTE — Progress Notes (Signed)
CARE MANAGEMENT NOTE 09/18/2014  Patient:  [facility]   Account Number:  [id number]  Date Initiated:  09/11/2014  Documentation initiated by:  HUTCHINSON,CRYSTAL  Subjective/Objective Assessment:   COPD     Action/Plan:   CM to follow for disposition needs   Anticipated DC Date:  09/18/2014   Anticipated DC Plan:  [place]  CM consult      [facility] Choice  HOME HEALTH   Choice offered to / List presented to:  C-1 Patient   DME arranged  NEBULIZER MACHINE      DME agency  [place] arranged  HH-2 PT  HH-1 RN  HH-3 OT      [facility]   Status of service:  Completed, signed off Medicare Important Message given?  YES (If response is "NO", the following Medicare IM given date fields will be blank) Date Medicare IM given:  09/16/2014 Medicare IM given by:  Tomi Bamberger Date Additional Medicare IM given:  09/18/2014 Additional Medicare IM given by:  Jonnie Finner  Discharge Disposition:  [place]  Per UR Regulation:  Reviewed for med. necessity/level of care/duration of stay  If discussed at [place] of Stay Meetings, dates discussed:   09/17/2014    Comments:  09/18/2014 1000 NCM spoke to pt and requesting nebulizer machine for home. Does not want RW at this time. Jonnie Finner RN CCM Case Mgmt phone [phone]  09/17/14 [place], BSN [place] and OT was added to services.  09/16/2014 1600 NCM spoke to pt and requested [facility] for [facility]. Pt requesting RW. Will recheck oxygen sat for home oxygen. Jonnie Finner RN CCM Case Mgmt phone [phone]  09/16/2014 1500 NCM spoke to pt and she lives at home with husband. She able to purchase her medications. Offered choice for [facility]. Explained she may need oxygen for home. Will have Unit RN check oxygen sats on RA while ambulating. She has nebulizer at home. Pt states she will decide if RW is  needed. Jonnie Finner RN CCM Case Mgmt phone [phone]  Mariann Laster RN, BSN, MSHL, CCM  Nurse - Case Manager,  (Unit [facility])  [phone]  09/11/2014 Hx/o 3 admissions and 2 ER visits over past 6 months. ADM:  COPD Social:  From home with spouse. Smoker:  yes THN:  active and gives handoff that patient self manages her care and in denial re:  smoking contributes to poor health and believes causes r/t medications. Dispo Plan:  Home / [place] CM will continue to monitor for needs as indicated.

## 2014-09-18 NOTE — Discharge Instructions (Signed)
Thank you for allowing [place] to be involved in your healthcare while you were hospitalized at [facility].   Please note that there have been changes to your home medications.  --> PLEASE LOOK AT YOUR DISCHARGE MEDICATION LIST FOR DETAILS.  Please call your PCP if you have any questions or concerns, or any difficulty getting any of your medications.  Please return to the ER if you have worsening of your symptoms or new severe symptoms arise.  For your COPD exacerbation, continue using your medications and breathing treatments as prescribed. I have ordered you cough syrup to use. Please stop smoking and avoid other irritants. I will provide a list of triggers to avoid. If your symptoms worsen, please return to the ED. I have made you an appointment with Pulmonology on 09/27/14 and with our Internal Medicine Clinic on 11/17. Please attend these appointments.  For your blood pressure, please STOP taking your lisinopril-HCTZ and carvedilol. Please START taking your metoprolol 25 mg twice a day and hydrochlorothiazide 25 mg a day.   Chronic Obstructive Pulmonary Disease Chronic obstructive pulmonary disease (COPD) is a common lung condition in which airflow from the lungs is limited. COPD is a general term that can be used to describe many different lung problems that limit airflow, including both chronic bronchitis and emphysema. If you have COPD, your lung function will probably never return to normal, but there are measures you can take to improve lung function and make yourself feel better.  CAUSES   Smoking (common).   Exposure to secondhand smoke.   Genetic problems.  Chronic inflammatory lung diseases or recurrent infections. SYMPTOMS   Shortness of breath, especially with physical activity.   Deep, persistent (chronic) cough with a large amount of thick mucus.   Wheezing.   Rapid breaths (tachypnea).   Gray or bluish discoloration (cyanosis) of the skin,  especially in fingers, toes, or lips.   Fatigue.   Weight loss.   Frequent infections or episodes when breathing symptoms become much worse (exacerbations).   Chest tightness. DIAGNOSIS  Your health care provider will take a medical history and perform a physical examination to make the initial diagnosis. Additional tests for COPD may include:   Lung (pulmonary) function tests.  Chest X-ray.  CT scan.  Blood tests. TREATMENT  Treatment available to help you feel better when you have COPD includes:   Inhaler and nebulizer medicines. These help manage the symptoms of COPD and make your breathing more comfortable.  Supplemental oxygen. Supplemental oxygen is only helpful if you have a low oxygen level in your blood.   Exercise and physical activity. These are beneficial for nearly all people with COPD. Some people may also benefit from a pulmonary rehabilitation program. HOME CARE INSTRUCTIONS   Take all medicines (inhaled or pills) as directed by your health care provider.  Avoid over-the-counter medicines or cough syrups that dry up your airway (such as antihistamines) and slow down the elimination of secretions unless instructed otherwise by your health care provider.   If you are a smoker, the most important thing that you can do is stop smoking. Continuing to smoke will cause further lung damage and breathing trouble. Ask your health care provider for help with quitting smoking. He or she can direct you to community resources or hospitals that provide support.  Avoid exposure to irritants such as smoke, chemicals, and fumes that aggravate your breathing.  Use oxygen therapy and pulmonary rehabilitation if directed by your health care  provider. If you require home oxygen therapy, ask your health care provider whether you should purchase a pulse oximeter to measure your oxygen level at home.   Avoid contact with individuals who have a contagious illness.  Avoid  extreme temperature and humidity changes.  Eat healthy foods. Eating smaller, more frequent meals and resting before meals may help you maintain your strength.  Stay active, but balance activity with periods of rest. Exercise and physical activity will help you maintain your ability to do things you want to do.  Preventing infection and hospitalization is very important when you have COPD. Make sure to receive all the vaccines your health care provider recommends, especially the pneumococcal and influenza vaccines. Ask your health care provider whether you need a pneumonia vaccine.  Learn and use relaxation techniques to manage stress.  Learn and use controlled breathing techniques as directed by your health care provider. Controlled breathing techniques include:   Pursed lip breathing. Start by breathing in (inhaling) through your nose for 1 second. Then, purse your lips as if you were going to whistle and breathe out (exhale) through the pursed lips for 2 seconds.   Diaphragmatic breathing. Start by putting one hand on your abdomen just above your waist. Inhale slowly through your nose. The hand on your abdomen should move out. Then purse your lips and exhale slowly. You should be able to feel the hand on your abdomen moving in as you exhale.   Learn and use controlled coughing to clear mucus from your lungs. Controlled coughing is a series of short, progressive coughs. The steps of controlled coughing are:  1. Lean your head slightly forward.  2. Breathe in deeply using diaphragmatic breathing.  3. Try to hold your breath for 3 seconds.  4. Keep your mouth slightly open while coughing twice.  5. Spit any mucus out into a tissue.  6. Rest and repeat the steps once or twice as needed. SEEK MEDICAL CARE IF:   You are coughing up more mucus than usual.   There is a change in the color or thickness of your mucus.   Your breathing is more labored than usual.   Your breathing  is faster than usual.  SEEK IMMEDIATE MEDICAL CARE IF:   You have shortness of breath while you are resting.   You have shortness of breath that prevents you from:  Being able to talk.   Performing your usual physical activities.   You have chest pain lasting longer than 5 minutes.   Your skin color is more cyanotic than usual.  You measure low oxygen saturations for longer than 5 minutes with a pulse oximeter. MAKE SURE YOU:   Understand these instructions.  Will watch your condition.  Will get help right away if you are not doing well or get worse. Document Released: 08/04/2005 Document Revised: 03/11/2014 Document Reviewed: 06/21/2013 [facility] Patient Information 2015 [place], [place]. This information is not intended to replace advice given to you by your health care provider. Make sure you discuss any questions you have with your health care provider.  Bronchospasm A bronchospasm is when the tubes that carry air in and out of your lungs (airways) spasm or tighten. During a bronchospasm it is hard to breathe. This is because the airways get smaller. A bronchospasm can be triggered by:  Allergies. These may be to animals, pollen, food, or mold.  Infection. This is a common cause of bronchospasm.  Exercise.  Irritants. These include pollution, cigarette smoke, strong odors,  aerosol sprays, and paint fumes.  Weather changes.  Stress.  Being emotional. HOME CARE   Always have a plan for getting help. Know when to call your doctor and local emergency services (911 in the [place] Know where you can get emergency care.  Only take medicines as told by your doctor.  If you were prescribed an inhaler or nebulizer machine, ask your doctor how to use it correctly. Always use a spacer with your inhaler if you were given one.  Stay calm during an attack. Try to relax and breathe more slowly.  Control your home environment:  Change your heating and air conditioning  filter at least once a month.  Limit your use of fireplaces and wood stoves.  Do not  smoke. Do not  allow smoking in your home.  Avoid perfumes and fragrances.  Get rid of pests (such as roaches and mice) and their droppings.  Throw away plants if you see mold on them.  Keep your house clean and dust free.  Replace carpet with wood, tile, or vinyl flooring. Carpet can trap dander and dust.  Use allergy-proof pillows, mattress covers, and box spring covers.  Wash bed sheets and blankets every week in hot water. Dry them in a dryer.  Use blankets that are made of polyester or cotton.  Wash hands frequently. GET HELP IF:  You have muscle aches.  You have chest pain.  The thick spit you spit or cough up (sputum) changes from clear or white to yellow, green, gray, or bloody.  The thick spit you spit or cough up gets thicker.  There are problems that may be related to the medicine you are given such as:  A rash.  Itching.  Swelling.  Trouble breathing. GET HELP RIGHT AWAY IF:  You feel you cannot breathe or catch your breath.  You cannot stop coughing.  Your treatment is not helping you breathe better.  You have very bad chest pain. MAKE SURE YOU:   Understand these instructions.  Will watch your condition.  Will get help right away if you are not doing well or get worse. Document Released: 08/22/2009 Document Revised: 10/30/2013 Document Reviewed: 04/17/2013 [facility] Patient Information 2015 [place], [place]. This information is not intended to replace advice given to you by your health care provider. Make sure you discuss any questions you have with your health care provider.

## 2014-09-18 NOTE — Plan of Care (Signed)
Problem: Phase II Progression Outcomes Goal: Dyspnea controlled w/progressive activity Outcome: Progressing     

## 2014-09-18 NOTE — Progress Notes (Signed)
Patient has refused walker.  She states she would rather use her cane and that she has several at home.

## 2014-09-18 NOTE — Plan of Care (Signed)
Problem: Phase II Progression Outcomes Goal: ADLs completed with minimal assistance Outcome: Progressing

## 2014-09-18 NOTE — Progress Notes (Addendum)
Subjective:  Patient was seen and examined this morning. Patient states her breathing is improved, she was able to ambulate around the hall twice without oxygen. Her cough and wheeze are improved from yesterday. Overall, patient is ready to go home, but she is anxious about having a recurrent COPD exacerbation or being exposed to her sick grandchildren over the holiday.   Objective: Vital signs in last 24 hours: Filed Vitals:   09/17/14 2023 09/17/14 2117 09/18/14 0514 09/18/14 0547  BP:  158/71  155/68  Pulse:  92 122 100  Temp:  98.4 F (36.9 C)  98.8 F (37.1 C)  TempSrc:  Oral  Oral  Resp:  20  18  Height:      Weight:      SpO2: 95% 97% 97% 97%   Weight change:   Intake/Output Summary (Last 24 hours) at 09/18/14 0700 Last data filed at 09/17/14 2200  Gross per 24 hour  Intake    270 ml  Output   2350 ml  Net  -2080 ml   General: Vital signs reviewed.  Patient is well-developed and well-nourished, in no acute distress and cooperative with exam.  Cardiovascular:  Regular rate, regular rhythm, S1 normal, S2 normal, no murmurs, gallops, or rubs. Pulmonary/Chest: Mild expiratory wheezes auscultated throughout, no rhonchi, no rales. No increased work of breathing. Abdominal: Soft, obese, non-tender, non-distended, BS +, no masses, organomegaly, or guarding present.  Musculoskeletal: No joint deformities, erythema, or stiffness, ROM full and nontender. Extremities: No lower extremity edema bilaterally,  pulses symmetric and intact bilaterally. No cyanosis or clubbing. Neurological: A&O x3. Skin: Warm, dry and intact. No rashes or erythema. Psychiatric: Normal mood and affect. Speech and behavior is normal. Cognition and memory are normal.   Lab Results: Basic Metabolic Panel:  Recent Labs Lab 09/15/14 0405 09/16/14 0455 09/17/14 0505  NA 144 139 139  K 4.1 4.1 4.2  CL 103 100 99  CO2 '30 29 29  ' GLUCOSE 165* 202* 246*  BUN '20 23 17  ' CREATININE 0.56 0.61 0.59    CALCIUM 8.5 8.4 8.6  MG 2.3 2.4  --   PHOS 3.5 3.4  --    CBC:  Recent Labs Lab 09/15/14 0405 09/16/14 0455  WBC 8.8 9.3  HGB 12.0 11.8*  HCT 37.5 36.5  MCV 88.9 88.4  PLT 321 300   Urine Drug Screen: Drugs of Abuse     Component Value Date/Time   LABOPIA NONE DETECTED 12/29/2009 0633   COCAINSCRNUR NONE DETECTED 12/29/2009 [place] DETECTED 12/29/2009 0633   AMPHETMU NONE DETECTED 12/29/2009 [place] DETECTED 12/29/2009 0633   LABBARB  12/29/2009 0633    NONE DETECTED        DRUG SCREEN FOR MEDICAL PURPOSES ONLY.  IF CONFIRMATION IS NEEDED FOR ANY PURPOSE, NOTIFY LAB WITHIN 5 DAYS.        LOWEST DETECTABLE LIMITS FOR URINE DRUG SCREEN Drug Class       Cutoff (ng/mL) Amphetamine      1000 Barbiturate      200 Benzodiazepine   790 Tricyclics       240 Opiates          300 Cocaine          300 THC              50    Medications:  I have reviewed the patient's current medications. Prior to Admission:  Prescriptions prior to admission  Medication Sig  Dispense Refill Last Dose  . ACCU-CHEK FASTCLIX LANCETS MISC 1 each by Other route See admin instructions. Check blood sugar daily as needed for high blood sugar.   09/10/2014 at Unknown time  . albuterol (PROVENTIL) (2.5 MG/3ML) 0.083% nebulizer solution Take 3 mLs (2.5 mg total) by nebulization every 6 (six) hours as needed for wheezing or shortness of breath. 1 vial 0 09/11/2014 at Unknown time  . albuterol (VENTOLIN HFA) 108 (90 BASE) MCG/ACT inhaler Inhale 2 puffs into the lungs every 6 (six) hours as needed for wheezing or shortness of breath. 1 Inhaler 3 09/11/2014 at Unknown time  . Blood Glucose Monitoring Suppl (ACCU-CHEK NANO SMARTVIEW) W/DEVICE KIT 1 each by Other route See admin instructions. Check blood sugar daily as needed for high blood sugar.   09/10/2014 at Unknown time  . budesonide-formoterol (SYMBICORT) 80-4.5 MCG/ACT inhaler Inhale 2 puffs into the lungs 2 (two) times daily as  needed. For asthma (Patient taking differently: Inhale 2 puffs into the lungs 2 (two) times daily. For asthma) 1 Inhaler 12 09/10/2014 at Unknown time  . carvedilol (COREG) 6.25 MG tablet Take 6.25 mg by mouth 2 (two) times daily with a meal.   09/10/2014 at 2100  . citalopram (CELEXA) 40 MG tablet Take 40 mg by mouth daily.   09/10/2014 at Unknown time  . fluticasone (FLONASE) 50 MCG/ACT nasal spray Place 1 spray into both nostrils daily as needed for allergies. (Patient taking differently: Place 1 spray into both nostrils daily. ) 16 g 2 09/10/2014 at Unknown time  . glucose blood (ACCU-CHEK SMARTVIEW) test strip 1 each by Other route See admin instructions. Check blood sugar daily as needed for high blood sugar.   09/10/2014 at Unknown time  . ipratropium-albuterol (DUONEB) 0.5-2.5 (3) MG/3ML SOLN Take 3 mLs by nebulization every 4 (four) hours as needed. (Patient taking differently: Take 3 mLs by nebulization every 4 (four) hours as needed (shortness of breath). ) 360 mL 0 09/11/2014 at Unknown time  . lisinopril-hydrochlorothiazide (PRINZIDE,ZESTORETIC) 20-25 MG per tablet Take 1 tablet by mouth daily.   09/10/2014 at Unknown time  . metFORMIN (GLUCOPHAGE) 1000 MG tablet Take 1 tablet (1,000 mg total) by mouth 2 (two) times daily with a meal. 60 tablet 1 09/10/2014 at Unknown time  . pravastatin (PRAVACHOL) 40 MG tablet Take 40 mg by mouth daily.   09/10/2014 at Unknown time  . sodium chloride (OCEAN) 0.65 % SOLN nasal spray Place 1 spray into both nostrils as needed for congestion. (Patient taking differently: Place 1 spray into both nostrils daily as needed for congestion. ) 15 mL 3 09/10/2014 at Unknown time  . benzonatate (TESSALON) 100 MG capsule Take 1 capsule (100 mg total) by mouth 3 (three) times daily. 20 capsule 0   . cyclobenzaprine (FLEXERIL) 5 MG tablet TAKE ONE TABLET BY MOUTH AT BEDTIME AS NEEDED FOR MUSCLE SPASMS 30 tablet 0   . Dextromethorphan-Guaifenesin (ROBITUSSIN SUGAR FREE) 10-100  MG/5ML liquid Take 5 mLs by mouth every 12 (twelve) hours. 120 mL 2   . sulfamethoxazole-trimethoprim (BACTRIM DS) 800-160 MG per tablet Take 1 tablet by mouth 2 (two) times daily. 10 tablet 0   . tiotropium (SPIRIVA) 18 MCG inhalation capsule Place 18 mcg into inhaler and inhale daily.   08/12/2014 at Unknown time   Scheduled Meds: . budesonide (PULMICORT) nebulizer solution  0.5 mg Nebulization BID  . citalopram  20 mg Per Tube Daily  . fluticasone  2 spray Each Nare BID  . heparin  5,000  Units Subcutaneous 3 times per day  . hydrochlorothiazide  25 mg Oral Daily  . insulin aspart  0-15 Units Subcutaneous TID WC  . insulin aspart  0-5 Units Subcutaneous QHS  . ipratropium-albuterol  3 mL Nebulization Q6H  . loratadine  10 mg Oral Daily  . methylPREDNISolone (SOLU-MEDROL) injection  60 mg Intravenous Q12H  . metoprolol tartrate  12.5 mg Oral BID  . pantoprazole  40 mg Oral BID AC  . sodium chloride  3 mL Intravenous Q12H  . zolpidem  5 mg Oral QHS   Continuous Infusions: . sodium chloride 10 mL/hr at 09/17/14 2057   PRN Meds:.albuterol, HYDROcodone-acetaminophen, HYDROcodone-homatropine, sodium chloride Assessment/Plan: Active Problems:   COPD exacerbation   Acute respiratory failure   Endotracheally intubated   Rhinovirus infection   Vocal cord dysfunction  Acute on Chronic COPD exacerbation 2/2 to Rhinovirus: Patient is improved. She continues to have a productive cough and mild wheezes, which we expect will improve over time. Patient is stable to be discharged to home today. She will need close follow up with her PCP and pulmonology.  -DuoNeb nebulizer Q4H -Transition to oral prednisone taper on discharge -Albuterol nebs Q2H prn -Flonase 2 spray daily  -Claritin 10 mg daily -Hycodan 5-1.5 mg Q6H prn -Pulmicort (budesonide) nebulizer BID -Consider ENT consult if wheeze persists -Protonix 40 mg BID -PT -Diet Carb Modified -Cardiac Monitor -Incentive  spirometry  Possible Vocal Cord Dysfunction: Patient has audible expiratory wheezes in the upper lung fields. She does not have stridor in her anterior neck and wheezes are audible in the rest of her lung fields. She has risk factors for VCD including exposures to irritants (cigarette smoke and smoke from grilling), anxiety, GERD and she is post-extubation. Gold standard for diagnosis is laryngoscopy. Patient could be evaluated using PFTs which would show inspiratory loop of flow-volume curve flattening and raio of forced expiratory flow to forced inspiratory flow at 50% VC >1, or it could be normal. Classic signs include inspiratory stridor with respiratory distress loudest over anterior neck and less audible throughout chest wall, choking sensation and more difficulty with inspiration versus expiration. Acute management includes reassurance, having patient pant and CPAP. Long term management is minimizing laryngeal irritation and treatment with speech and language pathologist. We will suggest outpatient referral to ENT for further evaluation. -Patient education -Consider PFTs outpatient -Consider consult to ENT if patient has not improved for laryngoscopy  HTN: Patient is on lisinopril-hydrochlorothiazide 20-15 mg daily and Coreg 6.25 mg BID at home. Blood pressure elevated at 155/68 and patient has been tachycardic at 100.  -HCTZ 15 mg daily -Will increase Metoprolol from 12.5 mg to 25 mg BID on discharge since blood pressure and heart rate can tolerate  DM Type II: HgA1c 6.8% on 06/14/2014. On metformin 500 mg BID at home.  -SSI-moderate with QHS converage -CBG monitoring with meals and QHS -Carb modified diet  DVT/PE ppx: Heparin SQ TID  Dispo: Disposition is deferred at this time, awaiting improvement of current medical problems.  Anticipated discharge in approximately 1-2 day(s).   The patient does have a current PCP (Christie Bales, MD) and does need an [facility] hospital follow-up appointment  after discharge.  The patient does not have transportation limitations that hinder transportation to clinic appointments.  .Services Needed at time of discharge: Y = Yes, Blank = No PT:   OT:   RN:   Equipment:   Other:     LOS: 7 days   Osa Craver, DO PGY-1 Internal  Medicine Resident Pager # [phone] 09/18/2014 7:00 AM   Date: 09/18/2014  Patient name: [name]  Medical record number: [id number]  Date of birth: 01-29-1951   This patient has been seen and the plan of care was discussed with the house staff. Please see their note for complete details. I concur with their findings with the following additions/corrections:  She is improved, some anxiety still about d/c.  Blood pressure 155/68, pulse 100, temperature 98.8 F (37.1 C), temperature source Oral, resp. rate 18, height '5\' 7"'  (1.702 m), weight 98.703 kg (217 lb 9.6 oz), SpO2 97 %. cv- rrr Chest- exp > insp wheezes abd- bs+, soft, non-tender.   Lab- no new  A/P COPD exacerbation Vocal cord dysfunction?  Will have her f/u with CCM Possible ENT eval Steroid taper   Campbell Riches, MD 09/18/2014, 11:32 AM

## 2014-09-18 NOTE — Progress Notes (Signed)
D/c instructions reviewed with pt. Copy of instructions and scripts given to pt. Pt waiting for her husband to arrive to take her home.

## 2014-09-18 NOTE — Progress Notes (Signed)
Pt.is trying to dress up to go home & ask the nurse to check her O2 SAT & HR.& her HR=112 & 02 SAT=98 on RA;Called MD on call & made aware & he said he will call back.

## 2014-09-18 NOTE — Progress Notes (Signed)
Discharge instructions given including activity,diet;ff up appts.& meds.by DEE am  Nurse.Patient verbalized understanding on all d/c instructions.IV access was dcd.;V/S stable;skin intactPt.to be escorted out by NT to be driven home by family.

## 2014-09-21 DIAGNOSIS — E119 Type 2 diabetes mellitus without complications: Secondary | ICD-10-CM | POA: Diagnosis not present

## 2014-09-21 DIAGNOSIS — J45909 Unspecified asthma, uncomplicated: Secondary | ICD-10-CM | POA: Diagnosis not present

## 2014-09-21 DIAGNOSIS — J440 Chronic obstructive pulmonary disease with acute lower respiratory infection: Secondary | ICD-10-CM | POA: Diagnosis not present

## 2014-09-21 DIAGNOSIS — B348 Other viral infections of unspecified site: Secondary | ICD-10-CM | POA: Diagnosis not present

## 2014-09-21 DIAGNOSIS — I10 Essential (primary) hypertension: Secondary | ICD-10-CM | POA: Diagnosis not present

## 2014-09-21 DIAGNOSIS — J441 Chronic obstructive pulmonary disease with (acute) exacerbation: Secondary | ICD-10-CM | POA: Diagnosis not present

## 2014-09-23 ENCOUNTER — Other Ambulatory Visit: Payer: Self-pay | Admitting: *Deleted

## 2014-09-23 DIAGNOSIS — J441 Chronic obstructive pulmonary disease with (acute) exacerbation: Secondary | ICD-10-CM | POA: Diagnosis not present

## 2014-09-23 DIAGNOSIS — B348 Other viral infections of unspecified site: Secondary | ICD-10-CM | POA: Diagnosis not present

## 2014-09-23 DIAGNOSIS — J440 Chronic obstructive pulmonary disease with acute lower respiratory infection: Secondary | ICD-10-CM | POA: Diagnosis not present

## 2014-09-23 DIAGNOSIS — J45909 Unspecified asthma, uncomplicated: Secondary | ICD-10-CM | POA: Diagnosis not present

## 2014-09-23 DIAGNOSIS — E119 Type 2 diabetes mellitus without complications: Secondary | ICD-10-CM | POA: Diagnosis not present

## 2014-09-23 DIAGNOSIS — I10 Essential (primary) hypertension: Secondary | ICD-10-CM | POA: Diagnosis not present

## 2014-09-23 NOTE — Telephone Encounter (Signed)
[place] visit today , first after hosp disch, orders for [facility], PT and OT. Would like verbal for [facility] for 3 times weekly x 1 week, 2 times a week x 3 weeks and 1 time week x 3 weeks. Medication eval, there was a conflict between celexa and flexeril. Pt also c/o painful tongue, on exam HHN states pt appears to have thrush, pt states she would like pain medicine for her tongue, only on R side of tongue at this time. Also needs strips for her meter. Please advise on pain med. Triage gave verbal approval for [facility] visits, is this ok?

## 2014-09-23 NOTE — Telephone Encounter (Signed)
Sure, the order for [facility] is fine.  She has a hospital f/u appt with Dr. Ethelene Hal on 11/18 at which time other items such as possible thrush can be thoroughly evaluated and appropriate treatment provided.  Thanks!

## 2014-09-25 ENCOUNTER — Ambulatory Visit (INDEPENDENT_AMBULATORY_CARE_PROVIDER_SITE_OTHER): Payer: Medicare Other | Admitting: Internal Medicine

## 2014-09-25 ENCOUNTER — Encounter: Payer: Self-pay | Admitting: Internal Medicine

## 2014-09-25 ENCOUNTER — Encounter: Payer: Medicare Other | Admitting: Internal Medicine

## 2014-09-25 VITALS — BP 109/68 | HR 102 | Temp 98.2°F | Wt 218.7 lb

## 2014-09-25 DIAGNOSIS — K029 Dental caries, unspecified: Secondary | ICD-10-CM | POA: Diagnosis not present

## 2014-09-25 DIAGNOSIS — J441 Chronic obstructive pulmonary disease with (acute) exacerbation: Secondary | ICD-10-CM | POA: Diagnosis not present

## 2014-09-25 DIAGNOSIS — B373 Candidiasis of vulva and vagina: Secondary | ICD-10-CM | POA: Diagnosis not present

## 2014-09-25 DIAGNOSIS — B379 Candidiasis, unspecified: Secondary | ICD-10-CM | POA: Insufficient documentation

## 2014-09-25 DIAGNOSIS — E119 Type 2 diabetes mellitus without complications: Secondary | ICD-10-CM

## 2014-09-25 LAB — GLUCOSE, CAPILLARY: Glucose-Capillary: 292 mg/dL — ABNORMAL HIGH (ref 70–99)

## 2014-09-25 MED ORDER — BENZONATATE 100 MG PO CAPS: 100.0000 mg | ORAL_CAPSULE | Freq: Three times a day (TID) | ORAL | Status: DC

## 2014-09-25 MED ORDER — FLUCONAZOLE 100 MG PO TABS: 150.0000 mg | ORAL_TABLET | Freq: Once | ORAL | Status: AC

## 2014-09-25 NOTE — Patient Instructions (Addendum)
Please follow up with your pulmonologist regarding your COPD. We have given you a prescription for diflucan for a yeast infection. Continue taking the tylenol and ibuprofen for your chest wall pain and mouth pain. We will check to see your options regarding the blood sugar monitoring and a dentist.   General Instructions:   Please bring your medicines with you each time you come to clinic.  Medicines may include prescription medications, over-the-counter medications, herbal remedies, eye drops, vitamins, or other pills.   Progress Toward Treatment Goals:  Treatment Goal 08/08/2014  Hemoglobin A1C -  Blood pressure -  Stop smoking smoking the same amount    Self Care Goals & Plans:  Self Care Goal 08/08/2014  Manage my medications take my medicines as prescribed; refill my medications on time; bring my medications to every visit  Monitor my health keep track of my blood pressure; keep track of my blood glucose; bring my glucose meter and log to each visit  Eat healthy foods eat more vegetables; eat baked foods instead of fried foods; eat foods that are low in salt  Be physically active find an activity I enjoy  Stop smoking set a quit date and stop smoking    Home Blood Glucose Monitoring 01/03/2013  Check my blood sugar 3 times a day  When to check my blood sugar before dinner; before meals     Care Management & Community Referrals:  Referral 01/03/2013  Referrals made for care management support none needed

## 2014-09-25 NOTE — Assessment & Plan Note (Signed)
Patient with several impacted dental caries, though unfortunately, we do not have any resources for her in terms of dental care.

## 2014-09-25 NOTE — Assessment & Plan Note (Addendum)
Since her hospitalization, patient reporting some vaginal itching and with clear discharge. Patient status post several courses of antibiotics as well as steroid therapy, consistent with vaginal candidiasis.  - Diflucan 150 mg by mouth once

## 2014-09-25 NOTE — Assessment & Plan Note (Addendum)
Patient status post recent hospitalization for COPD exacerbation necessitating intubation and stay in ICU for 2 days. Patient reports that her breathing is much better since she's been out of the hospital. She is not as anxious about status her breathing anymore. Patient reports that she doesn't remember much from her hospitalization and does become somewhat tearful about how sick she had been. She is very thankful for all the work of the medical team during her hospitalization. Patient's physical exam is unremarkable for any residual wheezes and patient is moving good air.   - Patient will reschedule follow-up with pulmonology's soon.  - Patient to continue prednisone taper, now day 3.  - Patient continue home albuterol, DuoNeb nebulizer, and Symbicort.

## 2014-09-25 NOTE — Progress Notes (Signed)
   Subjective:    Patient ID: [name], female    DOB: Jan 07, 1951, 63 y.o.   MRN: [id number]  HPI  Patient is a 63 year old female with a history of COPD, tobacco abuse, type 2 diabetes, depression, and hypertension who presents to clinic for a hospital follow-up for an admission for COPD exacerbation requiring ICU stay and intubation.  Please refer to separate problem-list charting for more details.  Review of Systems  Constitutional: Negative for fever and chills.  HENT: Negative for rhinorrhea and sore throat.   Eyes: Negative for visual disturbance.  Respiratory: Negative for cough and shortness of breath.   Cardiovascular: Negative for chest pain and palpitations.  Gastrointestinal: Negative for nausea, vomiting, abdominal pain, diarrhea, constipation and blood in stool.  Genitourinary: Negative for dysuria and hematuria.  Neurological: Negative for syncope.       Objective:   Physical Exam  Constitutional: She is oriented to person, place, and time. She appears well-developed and well-nourished. No distress.  HENT:  Head: Normocephalic and atraumatic.  Eyes: EOM are normal. Pupils are equal, round, and reactive to light. Left eye exhibits no discharge.  Neck: Normal range of motion. Neck supple. No thyromegaly present.  Cardiovascular: Normal rate and regular rhythm.  Exam reveals no gallop and no friction rub.   No murmur heard. Pulmonary/Chest: Effort normal and breath sounds normal. No respiratory distress. She has no wheezes. She has no rales.  Abdominal: Soft. Bowel sounds are normal. She exhibits no distension. There is no tenderness. There is no rebound.  Musculoskeletal: She exhibits no edema.  Neurological: She is alert and oriented to person, place, and time. No cranial nerve deficit.  Skin: Skin is warm and dry. No rash noted.  Psychiatric: She has a normal mood and affect. Thought content normal.      Assessment & Plan:  Please refer to separate  problem-list charting for more details.

## 2014-09-25 NOTE — Progress Notes (Signed)
Medicine attending: I personally interviewed and examined this patient together with resident physician Dr. Purcell Nails [facility] and I concur with his evaluation and management plan.

## 2014-09-27 ENCOUNTER — Inpatient Hospital Stay: Payer: Medicare Other | Admitting: Internal Medicine

## 2014-09-27 DIAGNOSIS — J441 Chronic obstructive pulmonary disease with (acute) exacerbation: Secondary | ICD-10-CM | POA: Diagnosis not present

## 2014-09-27 DIAGNOSIS — J45909 Unspecified asthma, uncomplicated: Secondary | ICD-10-CM | POA: Diagnosis not present

## 2014-09-27 DIAGNOSIS — I10 Essential (primary) hypertension: Secondary | ICD-10-CM | POA: Diagnosis not present

## 2014-09-27 DIAGNOSIS — E119 Type 2 diabetes mellitus without complications: Secondary | ICD-10-CM | POA: Diagnosis not present

## 2014-09-27 DIAGNOSIS — J440 Chronic obstructive pulmonary disease with acute lower respiratory infection: Secondary | ICD-10-CM | POA: Diagnosis not present

## 2014-09-27 DIAGNOSIS — B348 Other viral infections of unspecified site: Secondary | ICD-10-CM | POA: Diagnosis not present

## 2014-10-01 DIAGNOSIS — E119 Type 2 diabetes mellitus without complications: Secondary | ICD-10-CM | POA: Diagnosis not present

## 2014-10-01 DIAGNOSIS — I10 Essential (primary) hypertension: Secondary | ICD-10-CM | POA: Diagnosis not present

## 2014-10-01 DIAGNOSIS — B348 Other viral infections of unspecified site: Secondary | ICD-10-CM | POA: Diagnosis not present

## 2014-10-01 DIAGNOSIS — J45909 Unspecified asthma, uncomplicated: Secondary | ICD-10-CM | POA: Diagnosis not present

## 2014-10-01 DIAGNOSIS — J440 Chronic obstructive pulmonary disease with acute lower respiratory infection: Secondary | ICD-10-CM | POA: Diagnosis not present

## 2014-10-01 DIAGNOSIS — J441 Chronic obstructive pulmonary disease with (acute) exacerbation: Secondary | ICD-10-CM | POA: Diagnosis not present

## 2014-10-02 ENCOUNTER — Other Ambulatory Visit: Payer: Self-pay | Admitting: *Deleted

## 2014-10-02 DIAGNOSIS — J441 Chronic obstructive pulmonary disease with (acute) exacerbation: Secondary | ICD-10-CM | POA: Diagnosis not present

## 2014-10-02 DIAGNOSIS — J440 Chronic obstructive pulmonary disease with acute lower respiratory infection: Secondary | ICD-10-CM | POA: Diagnosis not present

## 2014-10-02 DIAGNOSIS — B348 Other viral infections of unspecified site: Secondary | ICD-10-CM | POA: Diagnosis not present

## 2014-10-02 DIAGNOSIS — I10 Essential (primary) hypertension: Secondary | ICD-10-CM | POA: Diagnosis not present

## 2014-10-02 DIAGNOSIS — E119 Type 2 diabetes mellitus without complications: Secondary | ICD-10-CM | POA: Diagnosis not present

## 2014-10-02 DIAGNOSIS — J45909 Unspecified asthma, uncomplicated: Secondary | ICD-10-CM | POA: Diagnosis not present

## 2014-10-02 MED ORDER — GLUCOSE BLOOD VI STRP: ORAL_STRIP | Status: DC

## 2014-10-04 DIAGNOSIS — I10 Essential (primary) hypertension: Secondary | ICD-10-CM | POA: Diagnosis not present

## 2014-10-04 DIAGNOSIS — E119 Type 2 diabetes mellitus without complications: Secondary | ICD-10-CM | POA: Diagnosis not present

## 2014-10-04 DIAGNOSIS — J440 Chronic obstructive pulmonary disease with acute lower respiratory infection: Secondary | ICD-10-CM | POA: Diagnosis not present

## 2014-10-04 DIAGNOSIS — J441 Chronic obstructive pulmonary disease with (acute) exacerbation: Secondary | ICD-10-CM | POA: Diagnosis not present

## 2014-10-04 DIAGNOSIS — B348 Other viral infections of unspecified site: Secondary | ICD-10-CM | POA: Diagnosis not present

## 2014-10-04 DIAGNOSIS — J45909 Unspecified asthma, uncomplicated: Secondary | ICD-10-CM | POA: Diagnosis not present

## 2014-10-07 ENCOUNTER — Telehealth: Payer: Self-pay | Admitting: Internal Medicine

## 2014-10-07 ENCOUNTER — Telehealth: Payer: Self-pay | Admitting: *Deleted

## 2014-10-07 NOTE — Telephone Encounter (Signed)
Call from [facility], Physical Therapist with [facility] # [phone]  Request is for Verbal Orders for PT twice a week for 4 weeks. Working on balance and endurance.  Request for PT was from Hospital discharge. Pt postponed but now feels ready for PT I gave the VO.  Will this be okay with you?

## 2014-10-07 NOTE — Telephone Encounter (Signed)
Clinic received a call from patient's home health nurse, Caryl Asp, regarding whether she should take her Spiriva. Joy reported that patient has not been very regularly requiring the DuoNeb's, last administration was 2 days ago. Nurse was informed that it would be okay for patient to continue taking the Spiriva as long as she has not been taking the DuoNeb's very regularly, i.e. 4x times a day.

## 2014-10-07 NOTE — Telephone Encounter (Signed)
Sure, thanks

## 2014-10-08 DIAGNOSIS — J441 Chronic obstructive pulmonary disease with (acute) exacerbation: Secondary | ICD-10-CM | POA: Diagnosis not present

## 2014-10-08 DIAGNOSIS — I10 Essential (primary) hypertension: Secondary | ICD-10-CM | POA: Diagnosis not present

## 2014-10-08 DIAGNOSIS — J440 Chronic obstructive pulmonary disease with acute lower respiratory infection: Secondary | ICD-10-CM | POA: Diagnosis not present

## 2014-10-08 DIAGNOSIS — J45909 Unspecified asthma, uncomplicated: Secondary | ICD-10-CM | POA: Diagnosis not present

## 2014-10-08 DIAGNOSIS — E119 Type 2 diabetes mellitus without complications: Secondary | ICD-10-CM | POA: Diagnosis not present

## 2014-10-08 DIAGNOSIS — B348 Other viral infections of unspecified site: Secondary | ICD-10-CM | POA: Diagnosis not present

## 2014-10-10 DIAGNOSIS — B348 Other viral infections of unspecified site: Secondary | ICD-10-CM | POA: Diagnosis not present

## 2014-10-10 DIAGNOSIS — J45909 Unspecified asthma, uncomplicated: Secondary | ICD-10-CM | POA: Diagnosis not present

## 2014-10-10 DIAGNOSIS — E119 Type 2 diabetes mellitus without complications: Secondary | ICD-10-CM | POA: Diagnosis not present

## 2014-10-10 DIAGNOSIS — I10 Essential (primary) hypertension: Secondary | ICD-10-CM | POA: Diagnosis not present

## 2014-10-10 DIAGNOSIS — J441 Chronic obstructive pulmonary disease with (acute) exacerbation: Secondary | ICD-10-CM | POA: Diagnosis not present

## 2014-10-10 DIAGNOSIS — J440 Chronic obstructive pulmonary disease with acute lower respiratory infection: Secondary | ICD-10-CM | POA: Diagnosis not present

## 2014-10-11 DIAGNOSIS — B348 Other viral infections of unspecified site: Secondary | ICD-10-CM | POA: Diagnosis not present

## 2014-10-11 DIAGNOSIS — J440 Chronic obstructive pulmonary disease with acute lower respiratory infection: Secondary | ICD-10-CM | POA: Diagnosis not present

## 2014-10-11 DIAGNOSIS — E119 Type 2 diabetes mellitus without complications: Secondary | ICD-10-CM | POA: Diagnosis not present

## 2014-10-11 DIAGNOSIS — J441 Chronic obstructive pulmonary disease with (acute) exacerbation: Secondary | ICD-10-CM | POA: Diagnosis not present

## 2014-10-11 DIAGNOSIS — J45909 Unspecified asthma, uncomplicated: Secondary | ICD-10-CM | POA: Diagnosis not present

## 2014-10-11 DIAGNOSIS — I10 Essential (primary) hypertension: Secondary | ICD-10-CM | POA: Diagnosis not present

## 2014-10-18 ENCOUNTER — Telehealth: Payer: Self-pay | Admitting: *Deleted

## 2014-10-18 ENCOUNTER — Other Ambulatory Visit: Payer: Self-pay | Admitting: Internal Medicine

## 2014-10-18 DIAGNOSIS — B348 Other viral infections of unspecified site: Secondary | ICD-10-CM | POA: Diagnosis not present

## 2014-10-18 DIAGNOSIS — E119 Type 2 diabetes mellitus without complications: Secondary | ICD-10-CM | POA: Diagnosis not present

## 2014-10-18 DIAGNOSIS — J440 Chronic obstructive pulmonary disease with acute lower respiratory infection: Secondary | ICD-10-CM | POA: Diagnosis not present

## 2014-10-18 DIAGNOSIS — J441 Chronic obstructive pulmonary disease with (acute) exacerbation: Secondary | ICD-10-CM | POA: Diagnosis not present

## 2014-10-18 DIAGNOSIS — J45909 Unspecified asthma, uncomplicated: Secondary | ICD-10-CM | POA: Diagnosis not present

## 2014-10-18 DIAGNOSIS — I10 Essential (primary) hypertension: Secondary | ICD-10-CM | POA: Diagnosis not present

## 2014-10-18 NOTE — Telephone Encounter (Signed)
Joy, HHN, [facility] calls and states pt continues to c/o mouth pain r/t teeth also that she has an area in her nose that is sore and that she had it one time before and was prescribed some ointment because she had MRSA. Do you want to re- prescribe this? Please advise

## 2014-10-21 ENCOUNTER — Telehealth: Payer: Self-pay | Admitting: *Deleted

## 2014-10-21 NOTE — Telephone Encounter (Signed)
Call from [place] with [facility] - # [phone]  Nurse is asking for Verbal Order for  "home health medical social worker" to assist pt with community resources and diabetic supplies. Will this be okay with you?   Talked with Dr Gordy Levan and she gave the order for above.  HHN informed

## 2014-10-22 NOTE — Telephone Encounter (Signed)
She will need to come in and be evaluated.  The ointment she is referring to should not be used on a regular basis.  Thanks.

## 2014-10-25 DIAGNOSIS — J441 Chronic obstructive pulmonary disease with (acute) exacerbation: Secondary | ICD-10-CM | POA: Diagnosis not present

## 2014-10-25 DIAGNOSIS — B348 Other viral infections of unspecified site: Secondary | ICD-10-CM | POA: Diagnosis not present

## 2014-10-25 DIAGNOSIS — J45909 Unspecified asthma, uncomplicated: Secondary | ICD-10-CM | POA: Diagnosis not present

## 2014-10-25 DIAGNOSIS — E119 Type 2 diabetes mellitus without complications: Secondary | ICD-10-CM | POA: Diagnosis not present

## 2014-10-25 DIAGNOSIS — J440 Chronic obstructive pulmonary disease with acute lower respiratory infection: Secondary | ICD-10-CM | POA: Diagnosis not present

## 2014-10-25 DIAGNOSIS — I10 Essential (primary) hypertension: Secondary | ICD-10-CM | POA: Diagnosis not present

## 2014-10-28 DIAGNOSIS — I10 Essential (primary) hypertension: Secondary | ICD-10-CM | POA: Diagnosis not present

## 2014-10-28 DIAGNOSIS — J45909 Unspecified asthma, uncomplicated: Secondary | ICD-10-CM | POA: Diagnosis not present

## 2014-10-28 DIAGNOSIS — B348 Other viral infections of unspecified site: Secondary | ICD-10-CM | POA: Diagnosis not present

## 2014-10-28 DIAGNOSIS — E119 Type 2 diabetes mellitus without complications: Secondary | ICD-10-CM | POA: Diagnosis not present

## 2014-10-28 DIAGNOSIS — J440 Chronic obstructive pulmonary disease with acute lower respiratory infection: Secondary | ICD-10-CM | POA: Diagnosis not present

## 2014-10-28 DIAGNOSIS — J441 Chronic obstructive pulmonary disease with (acute) exacerbation: Secondary | ICD-10-CM | POA: Diagnosis not present

## 2014-10-30 DIAGNOSIS — I10 Essential (primary) hypertension: Secondary | ICD-10-CM | POA: Diagnosis not present

## 2014-10-30 DIAGNOSIS — J440 Chronic obstructive pulmonary disease with acute lower respiratory infection: Secondary | ICD-10-CM | POA: Diagnosis not present

## 2014-10-30 DIAGNOSIS — J45909 Unspecified asthma, uncomplicated: Secondary | ICD-10-CM | POA: Diagnosis not present

## 2014-10-30 DIAGNOSIS — B348 Other viral infections of unspecified site: Secondary | ICD-10-CM | POA: Diagnosis not present

## 2014-10-30 DIAGNOSIS — E119 Type 2 diabetes mellitus without complications: Secondary | ICD-10-CM | POA: Diagnosis not present

## 2014-10-30 DIAGNOSIS — J441 Chronic obstructive pulmonary disease with (acute) exacerbation: Secondary | ICD-10-CM | POA: Diagnosis not present

## 2014-11-10 ENCOUNTER — Other Ambulatory Visit: Payer: Self-pay | Admitting: Internal Medicine

## 2014-11-11 ENCOUNTER — Ambulatory Visit (INDEPENDENT_AMBULATORY_CARE_PROVIDER_SITE_OTHER): Payer: Medicare Other | Admitting: Internal Medicine

## 2014-11-11 ENCOUNTER — Encounter: Payer: Self-pay | Admitting: Internal Medicine

## 2014-11-11 VITALS — BP 165/81 | HR 99 | Ht 67.0 in | Wt 222.2 lb

## 2014-11-11 DIAGNOSIS — Z0000 Encounter for general adult medical examination without abnormal findings: Secondary | ICD-10-CM | POA: Diagnosis not present

## 2014-11-11 DIAGNOSIS — I10 Essential (primary) hypertension: Secondary | ICD-10-CM | POA: Diagnosis not present

## 2014-11-11 DIAGNOSIS — J441 Chronic obstructive pulmonary disease with (acute) exacerbation: Secondary | ICD-10-CM | POA: Diagnosis not present

## 2014-11-11 MED ORDER — PREDNISONE 1 MG PO TABS: 40.0000 mg | ORAL_TABLET | Freq: Every day | ORAL | Status: DC

## 2014-11-11 MED ORDER — METHYLPREDNISOLONE SODIUM SUCC 125 MG IJ SOLR: 125.0000 mg | Freq: Once | INTRAMUSCULAR | Status: DC

## 2014-11-11 MED ORDER — IPRATROPIUM BROMIDE 0.02 % IN SOLN
0.5000 mg | Freq: Once | RESPIRATORY_TRACT | Status: AC
  Administered 2014-11-11: 0.5 mg via RESPIRATORY_TRACT

## 2014-11-11 MED ORDER — LEVOFLOXACIN 250 MG PO TABS: 750.0000 mg | ORAL_TABLET | Freq: Every day | ORAL | Status: DC

## 2014-11-11 MED ORDER — LISINOPRIL-HYDROCHLOROTHIAZIDE 20-25 MG PO TABS: 1.0000 | ORAL_TABLET | Freq: Every day | ORAL | Status: DC

## 2014-11-11 MED ORDER — CARVEDILOL 6.25 MG PO TABS: 6.2500 mg | ORAL_TABLET | Freq: Two times a day (BID) | ORAL | Status: DC

## 2014-11-11 MED ORDER — METHYLPREDNISOLONE SODIUM SUCC 125 MG IJ SOLR
125.0000 mg | Freq: Once | INTRAMUSCULAR | Status: AC
  Administered 2014-11-11: 125 mg via INTRAMUSCULAR

## 2014-11-11 MED ORDER — LEVOFLOXACIN 750 MG PO TABS: 750.0000 mg | ORAL_TABLET | Freq: Every day | ORAL | Status: AC

## 2014-11-11 MED ORDER — ALBUTEROL SULFATE (2.5 MG/3ML) 0.083% IN NEBU
2.5000 mg | INHALATION_SOLUTION | Freq: Once | RESPIRATORY_TRACT | Status: AC
  Administered 2014-11-11: 2.5 mg via RESPIRATORY_TRACT

## 2014-11-11 NOTE — Assessment & Plan Note (Signed)
BP elevated today.  Pt was discontinued on her BP meds of lisinopril-HCTZ during her hospitalization for possible vocal cord dysfunction?  I do not see a reason to discontinue lisinopril as we have an explanation for her cough.   -will ask her to resume her BP meds she was on previously -d/c metoprolol and resume carvedilol (metoprolol would not be a good choice given her h/o depression) -will resume the combo of lisinopril/HCTZ -follow up in 1 month

## 2014-11-11 NOTE — Progress Notes (Signed)
Patient ID: [name], female   DOB: 12-Sep-1951, 64 y.o.   MRN: [id number]    Subjective:   [name]    DOB: 20-Dec-1950 64 y.o.    MRN: [id number] Health Maintenance Due: Health Maintenance Due  Topic Date Due  . PAP SMEAR  12/30/1968  . COLONOSCOPY  12/30/2000  . ZOSTAVAX  12/30/2010  . OPHTHALMOLOGY EXAM  02/08/2014    _________________________________________________  HPI: [name] is a 64 y.o. female here for a routine visit.  Pt has a PMH outlined below.  Please see problem-based charting assessment and plan note for further details of medical issues addressed at today's visit.  PMH: Past Medical History  Diagnosis Date  . Hyperlipidemia   . Hypertension   . Tobacco abuse   . COPD (chronic obstructive pulmonary disease)     History of multiple hospital admissions for exercabation   . Asthma   . Breast cancer 1991    s/p lumpectomy, chemotherapy and radiation therapy in 1991. Mammogram in 2007 was normal.  . Sigmoid diverticulitis 80/2008  . Anxiety   . Depression   . Obesity   . GERD (gastroesophageal reflux disease)   . Heart murmur 10/05/11    "first time I ever heard I had one was today"  . Pneumonia   . Shortness of breath 10/05/11    "at rest; lying down; w/exertion"  . Diabetes mellitus   . Bronchitis     h/o  . Diarrhea     h/o  . Constipated     h/o  . COPD with exacerbation 04/06/2009    Qualifier: Diagnosis of  By: Eyvonne Mechanic MD, Vijay      Medications: Current Outpatient Prescriptions on File Prior to Visit  Medication Sig Dispense Refill  . ACCU-CHEK FASTCLIX LANCETS MISC 1 each by Other route See admin instructions. Check blood sugar daily as needed for high blood sugar.    . albuterol (PROVENTIL) (2.5 MG/3ML) 0.083% nebulizer solution Take 3 mLs (2.5 mg total) by nebulization every 6 (six) hours as needed for wheezing or shortness of breath. 1 vial 0  . albuterol (VENTOLIN HFA) 108 (90 BASE)  MCG/ACT inhaler Inhale 2 puffs into the lungs every 6 (six) hours as needed for wheezing or shortness of breath. 1 Inhaler 3  . benzonatate (TESSALON) 100 MG capsule Take 1 capsule (100 mg total) by mouth 3 (three) times daily. 20 capsule 0  . Blood Glucose Monitoring Suppl (ACCU-CHEK NANO SMARTVIEW) W/DEVICE KIT 1 each by Other route See admin instructions. Check blood sugar daily as needed for high blood sugar.    . budesonide-formoterol (SYMBICORT) 80-4.5 MCG/ACT inhaler Inhale 2 puffs into the lungs 2 (two) times daily as needed. For asthma (Patient taking differently: Inhale 2 puffs into the lungs 2 (two) times daily. For asthma) 1 Inhaler 12  . citalopram (CELEXA) 40 MG tablet Take 40 mg by mouth daily.    . cyclobenzaprine (FLEXERIL) 5 MG tablet Take 1 tablet (5 mg total) by mouth at bedtime as needed for muscle spasms. 30 tablet 0  . Dextromethorphan-Guaifenesin (ROBITUSSIN SUGAR FREE) 10-100 MG/5ML liquid Take 5 mLs by mouth every 12 (twelve) hours. 120 mL 2  . fluticasone (FLONASE) 50 MCG/ACT nasal spray Place 1 spray into both nostrils daily as needed for allergies. (Patient taking differently: Place 1 spray into both nostrils daily. ) 16 g 2  . glucose blood (ACCU-CHEK SMARTVIEW) test strip Use to check blood sugar 1 to 2 times  daily. diag code E 11.9. Non- insulin dependent 100 each 6  . hydrochlorothiazide (HYDRODIURIL) 25 MG tablet TAKE ONE TABLET BY MOUTH ONCE DAILY 30 tablet 3  . HYDROcodone-homatropine (HYCODAN) 5-1.5 MG/5ML syrup Take 5 mLs by mouth every 6 (six) hours as needed for cough. 120 mL 0  . ipratropium-albuterol (DUONEB) 0.5-2.5 (3) MG/3ML SOLN Take 3 mLs by nebulization every 4 (four) hours as needed. (Patient taking differently: Take 3 mLs by nebulization every 4 (four) hours as needed (shortness of breath). ) 360 mL 0  . metFORMIN (GLUCOPHAGE) 1000 MG tablet Take 1 tablet (1,000 mg total) by mouth 2 (two) times daily with a meal. 60 tablet 1  . metoprolol tartrate  (LOPRESSOR) 25 MG tablet Take 0.5 tablets (12.5 mg total) by mouth 2 (two) times daily. 60 tablet 0  . pantoprazole (PROTONIX) 40 MG tablet Take 1 tablet (40 mg total) by mouth daily. 30 tablet 0  . pravastatin (PRAVACHOL) 40 MG tablet Take 40 mg by mouth daily.    . predniSONE (DELTASONE) 20 MG tablet Take 1 tablet (20 mg total) by mouth daily with breakfast. 60 mg (3 tablets) for 3 days. 40 mg (2 tablets) for 3 days. 20 mg (1 tablet) for 3 days. 18 tablet 0  . sodium chloride (OCEAN) 0.65 % SOLN nasal spray Place 1 spray into both nostrils as needed for congestion. (Patient taking differently: Place 1 spray into both nostrils daily as needed for congestion. ) 15 mL 3  . tiotropium (SPIRIVA) 18 MCG inhalation capsule Place 18 mcg into inhaler and inhale daily.    . [DISCONTINUED] albuterol (PROVENTIL,VENTOLIN) 90 MCG/ACT inhaler Inhale 2 puffs into the lungs every 6 (six) hours as needed for wheezing. 17 g 12   No current facility-administered medications on file prior to visit.    Allergies: No Known Allergies  FH: Family History  Problem Relation Age of Onset  . Cancer Mother     SH: History   Social History  . Marital Status: Married    Spouse Name: N/A    Number of Children: N/A  . Years of Education: N/A   Social History Main Topics  . Smoking status: Former Smoker -- 0.20 packs/day for 40 years    Types: Cigarettes    Quit date: 09/11/2014  . Smokeless tobacco: Never Used     Comment: slowly quitting.  . Alcohol Use: 2.4 oz/week    4 Cans of beer per week     Comment: "only on the weekends"  . Drug Use: No  . Sexual Activity: None   Other Topics Concern  . None   Social History Narrative   Lives in [place] with her husband.   Takes care of 3 grand children.   Trying to find a job, has financial difficulties.          Review of Systems: Constitutional: Negative for fever, chills and weight loss.  Eyes: Negative for blurred vision.  Respiratory: Negative  for cough and shortness of breath.  Cardiovascular: Negative for chest pain, palpitations and leg swelling.  Gastrointestinal: Negative for nausea, vomiting, abdominal pain, diarrhea, constipation and blood in stool.  Genitourinary: Negative for dysuria, urgency and frequency.  Musculoskeletal: Negative for myalgias and back pain.  Neurological: Negative for dizziness, weakness and headaches.     Objective:   Vital Signs: Filed Vitals:   11/11/14 1438  BP: 165/81  Pulse: 99  Height: '5\' 7"'  (1.702 m)  Weight: 100.789 kg (222 lb 3.2 oz)  SpO2: 97%  BP Readings from Last 3 Encounters:  11/11/14 165/81  09/25/14 109/68  09/18/14 113/87    Physical Exam: Constitutional: Vital signs reviewed.  Patient is well-developed and well-nourished in NAD and cooperative with exam.  Head: Normocephalic and atraumatic. Eyes: PERRL, EOMI, conjunctivae nl, no scleral icterus.  Neck: Supple. Cardiovascular: RRR, no MRG. Pulmonary/Chest: normal effort, non-tender to palpation, diffuse expiratory wheezes, with decreased air movement (no crackles or rhonchi) Abdominal: Soft. NT/ND +BS. Neurological: A&O x3, cranial nerves II-XII are grossly intact, moving all extremities. Extremities: 2+DP b/l; no pitting edema. Skin: Warm, dry and intact. No rash.   Assessment & Plan:   Assessment and plan was discussed and formulated with my attending.

## 2014-11-11 NOTE — Patient Instructions (Signed)
Thank you for your visit today.   Please return to the internal medicine clinic in as needed.  You will need to schedule a follow up appointment in 1 month to address other medical issues.   Your current medical regimen is effective;  continue present plan and take all medications as prescribed.    I have prescribed you prednisone to be taken daily with breakfast for 5 days. Also please take your antibiotic for 5 days which has been sent to your pharmacy. Return to the clinic if you have difficulty breathing or worsening symptoms.   Please be sure to bring all of your medications with you to every visit; this includes herbal supplements, vitamins, eye drops, and any over-the-counter medications.   Should you have any questions regarding your medications and/or any new or worsening symptoms, please be sure to call the clinic at [phone].   If you believe that you are suffering from a life threatening condition or one that may result in the loss of limb or function, then you should call 911 or proceed to the nearest Emergency Department.   Smoking Cessation Quitting smoking is important to your health and has many advantages. However, it is not always easy to quit since nicotine is a very addictive drug. Oftentimes, people try 3 times or more before being able to quit. This document explains the best ways for you to prepare to quit smoking. Quitting takes hard work and a lot of effort, but you can do it. ADVANTAGES OF QUITTING SMOKING  You will live longer, feel better, and live better.  Your body will feel the impact of quitting smoking almost immediately.  Within 20 minutes, blood pressure decreases. Your pulse returns to its normal level.  After 8 hours, carbon monoxide levels in the blood return to normal. Your oxygen level increases.  After 24 hours, the chance of having a heart attack starts to decrease. Your breath, hair, and body stop smelling like smoke.  After 48 hours,  damaged nerve endings begin to recover. Your sense of taste and smell improve.  After 72 hours, the body is virtually free of nicotine. Your bronchial tubes relax and breathing becomes easier.  After 2 to 12 weeks, lungs can hold more air. Exercise becomes easier and circulation improves.  The risk of having a heart attack, stroke, cancer, or lung disease is greatly reduced.  After 1 year, the risk of coronary heart disease is cut in half.  After 5 years, the risk of stroke falls to the same as a nonsmoker.  After 10 years, the risk of lung cancer is cut in half and the risk of other cancers decreases significantly.  After 15 years, the risk of coronary heart disease drops, usually to the level of a nonsmoker.  If you are pregnant, quitting smoking will improve your chances of having a healthy baby.  The people you live with, especially any children, will be healthier.  You will have extra money to spend on things other than cigarettes. QUESTIONS TO THINK ABOUT BEFORE ATTEMPTING TO QUIT You may want to talk about your answers with your health care provider.  Why do you want to quit?  If you tried to quit in the past, what helped and what did not?  What will be the most difficult situations for you after you quit? How will you plan to handle them?  Who can help you through the tough times? Your family? Friends? A health care provider?  What pleasures do you  get from smoking? What ways can you still get pleasure if you quit? Here are some questions to ask your health care provider:  How can you help me to be successful at quitting?  What medicine do you think would be best for me and how should I take it?  What should I do if I need more help?  What is smoking withdrawal like? How can I get information on withdrawal? GET READY  Set a quit date.  Change your environment by getting rid of all cigarettes, ashtrays, matches, and lighters in your home, car, or work. Do not let  people smoke in your home.  Review your past attempts to quit. Think about what worked and what did not. GET SUPPORT AND ENCOURAGEMENT You have a better chance of being successful if you have help. You can get support in many ways.  Tell your family, friends, and coworkers that you are going to quit and need their support. Ask them not to smoke around you.  Get individual, group, or telephone counseling and support. Programs are available at [employer] and health centers. Call your local health department for information about programs in your area.  Spiritual beliefs and practices may help some smokers quit.  Download a "quit meter" on your computer to keep track of quit statistics, such as how long you have gone without smoking, cigarettes not smoked, and money saved.  Get a self-help book about quitting smoking and staying off tobacco. [place] yourself from urges to smoke. Talk to someone, go for a walk, or occupy your time with a task.  Change your normal routine. Take a different route to work. Drink tea instead of coffee. Eat breakfast in a different place.  Reduce your stress. Take a hot bath, exercise, or read a book.  Plan something enjoyable to do every day. Reward yourself for not smoking.  Explore interactive web-based programs that specialize in helping you quit. GET MEDICINE AND USE IT CORRECTLY Medicines can help you stop smoking and decrease the urge to smoke. Combining medicine with the above behavioral methods and support can greatly increase your chances of successfully quitting smoking.  Nicotine replacement therapy helps deliver nicotine to your body without the negative effects and risks of smoking. Nicotine replacement therapy includes nicotine gum, lozenges, inhalers, nasal sprays, and skin patches. Some may be available over-the-counter and others require a prescription.  Antidepressant medicine helps people abstain from  smoking, but how this works is unknown. This medicine is available by prescription.  Nicotinic receptor partial agonist medicine simulates the effect of nicotine in your brain. This medicine is available by prescription. Ask your health care provider for advice about which medicines to use and how to use them based on your health history. Your health care provider will tell you what side effects to look out for if you choose to be on a medicine or therapy. Carefully read the information on the package. Do not use any other product containing nicotine while using a nicotine replacement product.  RELAPSE OR DIFFICULT SITUATIONS Most relapses occur within the first 3 months after quitting. Do not be discouraged if you start smoking again. Remember, most people try several times before finally quitting. You may have symptoms of withdrawal because your body is used to nicotine. You may crave cigarettes, be irritable, feel very hungry, cough often, get headaches, or have difficulty concentrating. The withdrawal symptoms are only temporary. They are strongest when you first quit, but  they will go away within 10-14 days. To reduce the chances of relapse, try to:  Avoid drinking alcohol. Drinking lowers your chances of successfully quitting.  Reduce the amount of caffeine you consume. Once you quit smoking, the amount of caffeine in your body increases and can give you symptoms, such as a rapid heartbeat, sweating, and anxiety.  Avoid smokers because they can make you want to smoke.  Do not let weight gain distract you. Many smokers will gain weight when they quit, usually less than 10 pounds. Eat a healthy diet and stay active. You can always lose the weight gained after you quit.  Find ways to improve your mood other than smoking. FOR MORE INFORMATION  www.smokefree.gov  Document Released: 10/19/2001 Document Revised: 03/11/2014 Document Reviewed: 02/03/2012 [facility] Patient Information 2015 [place],  [place]. This information is not intended to replace advice given to you by your health care provider. Make sure you discuss any questions you have with your health care provider.

## 2014-11-11 NOTE — Assessment & Plan Note (Addendum)
Pt p/w increased cough, sputum production (yellowish in color), and URI symptoms.  She stated this has been going on for about 2 weeks.  Reports using nebulizer tx every 4 hours for the past couple of days.  Denies fever/chills, N/V.  Denies sick contacts.  Also reports increased wheezing.  On exam, she has expiratory wheezing but is speaking in full sentences without accessory muscle use.  97% O2 sat on RA.  Unfortunately she missed her follow up appt [facility] and will need to reschedule. Unfortunately she is still smoking and I stressed the need to quit-->she stated she had quit previously but has been difficult as everyone around her smokes.   -solumedrol 125mg  IM clinic today -begin prednisone 40mg  x 5 days -continue duonebs but reminded not to use spiriva when using duonebs -levaquin 750mg  x 5 days -return to clinic if symptoms worsen or do not improve  -reschedule appt with Dr. Melvyn Novas -needs PFTs -advised smoking cessation

## 2014-11-11 NOTE — Assessment & Plan Note (Signed)
-  will need to reschedule appt for health care maintenance discussion as pt has many acute issues today -DM management

## 2014-11-12 NOTE — Progress Notes (Signed)
Internal Medicine Clinic Attending  Case discussed with Dr. Gill soon after the resident saw the patient.  We reviewed the resident's history and exam and pertinent patient test results.  I agree with the assessment, diagnosis, and plan of care documented in the resident's note.  

## 2014-11-15 ENCOUNTER — Other Ambulatory Visit: Payer: Self-pay | Admitting: Internal Medicine

## 2014-11-15 MED ORDER — PREDNISONE 20 MG PO TABS: 40.0000 mg | ORAL_TABLET | Freq: Every day | ORAL | Status: DC

## 2014-11-18 ENCOUNTER — Other Ambulatory Visit: Payer: Self-pay | Admitting: Internal Medicine

## 2014-11-18 ENCOUNTER — Encounter: Payer: Medicare Other | Admitting: Internal Medicine

## 2014-11-20 ENCOUNTER — Telehealth: Payer: Self-pay | Admitting: *Deleted

## 2014-11-20 NOTE — Telephone Encounter (Signed)
Talked with Dr Gordy Levan about home orders with [facility] - will not reorder or continue with home care per Dr Gordy Levan. Talked with Jenny Reichmann at [facility]. Hilda Blades Sanae Willetts RN 11/20/14 10:30AM

## 2014-11-20 NOTE — Telephone Encounter (Signed)
Thanks Debra ?

## 2014-11-20 NOTE — Telephone Encounter (Signed)
Jenny Reichmann from [facility] called to let clinic be aware saw pt 10/30/14 and has been unable to catch up with  pt since then. They have left message on home phone [phone]. Wanted to know should they continue to reach pt for home care. Hilda Blades Jeromie Gainor RN 11/20/14 10:10AM

## 2014-11-22 ENCOUNTER — Encounter: Payer: Self-pay | Admitting: Internal Medicine

## 2014-11-22 ENCOUNTER — Ambulatory Visit (INDEPENDENT_AMBULATORY_CARE_PROVIDER_SITE_OTHER): Payer: Medicare Other | Admitting: Family Medicine

## 2014-11-22 ENCOUNTER — Ambulatory Visit (INDEPENDENT_AMBULATORY_CARE_PROVIDER_SITE_OTHER): Payer: Medicare Other | Admitting: Internal Medicine

## 2014-11-22 ENCOUNTER — Ambulatory Visit: Payer: Medicare Other | Admitting: Internal Medicine

## 2014-11-22 ENCOUNTER — Encounter: Payer: Self-pay | Admitting: Family Medicine

## 2014-11-22 VITALS — BP 129/74 | HR 94 | Temp 98.4°F | Ht 67.0 in | Wt 220.1 lb

## 2014-11-22 VITALS — BP 132/79 | HR 106 | Ht 67.0 in | Wt 220.0 lb

## 2014-11-22 DIAGNOSIS — F17200 Nicotine dependence, unspecified, uncomplicated: Secondary | ICD-10-CM

## 2014-11-22 DIAGNOSIS — M7501 Adhesive capsulitis of right shoulder: Secondary | ICD-10-CM

## 2014-11-22 DIAGNOSIS — M25511 Pain in right shoulder: Secondary | ICD-10-CM

## 2014-11-22 DIAGNOSIS — F17210 Nicotine dependence, cigarettes, uncomplicated: Secondary | ICD-10-CM

## 2014-11-22 DIAGNOSIS — E119 Type 2 diabetes mellitus without complications: Secondary | ICD-10-CM | POA: Diagnosis not present

## 2014-11-22 DIAGNOSIS — M7500 Adhesive capsulitis of unspecified shoulder: Secondary | ICD-10-CM | POA: Insufficient documentation

## 2014-11-22 DIAGNOSIS — I10 Essential (primary) hypertension: Secondary | ICD-10-CM

## 2014-11-22 DIAGNOSIS — M25512 Pain in left shoulder: Secondary | ICD-10-CM

## 2014-11-22 MED ORDER — DICLOFENAC SODIUM 75 MG PO TBEC: 75.0000 mg | DELAYED_RELEASE_TABLET | Freq: Three times a day (TID) | ORAL | Status: DC

## 2014-11-22 MED ORDER — METHYLPREDNISOLONE ACETATE 40 MG/ML IJ SUSP
40.0000 mg | Freq: Once | INTRAMUSCULAR | Status: AC
  Administered 2014-11-22: 40 mg via INTRA_ARTICULAR

## 2014-11-22 NOTE — Assessment & Plan Note (Signed)
We discussed natural history of adhesive capsulitis. She opted for large-volume serial injections. We gave her the first one today. I explained to her and her husband the absolute necessity of doing wall crawls exercises multiple times a day over the next 3-4 weeks. She was complaining of significant pain. Checked her creatinine is normal. I will give her diclofenac 3 times a day with decrease to twice a day or discontinuation should she develop any stomach problems or dark tarry stools. I will see her back in 4 weeks.

## 2014-11-22 NOTE — Assessment & Plan Note (Signed)
-  continue current meds -we discussed in detail and I personally went over the medications that she should be taking daily at the end of our visit.  She was also given an AVS with her current list of meds.

## 2014-11-22 NOTE — Patient Instructions (Signed)
Thank you for your visit today.   Please return to the internal medicine clinic in 34month or sooner if needed.   Your current medical regimen is effective;  continue present plan and take all medications as prescribed.   I will refer you to sports medicine for your shoulder pain.  Please follow their instructions.  Please see your print out of your medications.   Please be sure to bring all of your medications with you to every visit; this includes herbal supplements, vitamins, eye drops, and any over-the-counter medications.   Should you have any questions regarding your medications and/or any new or worsening symptoms, please be sure to call the clinic at [phone].   If you believe that you are suffering from a life threatening condition or one that may result in the loss of limb or function, then you should call 911 or proceed to the nearest Emergency Department.   Shoulder Pain The shoulder is the joint that connects your arms to your body. The bones that form the shoulder joint include the upper arm bone (humerus), the shoulder blade (scapula), and the collarbone (clavicle). The top of the humerus is shaped like a ball and fits into a rather flat socket on the scapula (glenoid cavity). A combination of muscles and strong, fibrous tissues that connect muscles to bones (tendons) support your shoulder joint and hold the ball in the socket. Small, fluid-filled sacs (bursae) are located in different areas of the joint. They act as cushions between the bones and the overlying soft tissues and help reduce friction between the gliding tendons and the bone as you move your arm. Your shoulder joint allows a wide range of motion in your arm. This range of motion allows you to do things like scratch your back or throw a ball. However, this range of motion also makes your shoulder more prone to pain from overuse and injury. Causes of shoulder pain can originate from both injury and overuse and usually  can be grouped in the following four categories:  Redness, swelling, and pain (inflammation) of the tendon (tendinitis) or the bursae (bursitis).  Instability, such as a dislocation of the joint.  Inflammation of the joint (arthritis).  Broken bone (fracture). HOME CARE INSTRUCTIONS   Apply ice to the sore area.  Put ice in a plastic bag.  Place a towel between your skin and the bag.  Leave the ice on for 15-20 minutes, 3-4 times per day for the first 2 days, or as directed by your health care provider.  Stop using cold packs if they do not help with the pain.  If you have a shoulder sling or immobilizer, wear it as long as your caregiver instructs. Only remove it to shower or bathe. Move your arm as little as possible, but keep your hand moving to prevent swelling.  Squeeze a soft ball or foam pad as much as possible to help prevent swelling.  Only take over-the-counter or prescription medicines for pain, discomfort, or fever as directed by your caregiver. SEEK MEDICAL CARE IF:   Your shoulder pain increases, or new pain develops in your arm, hand, or fingers.  Your hand or fingers become cold and numb.  Your pain is not relieved with medicines. SEEK IMMEDIATE MEDICAL CARE IF:   Your arm, hand, or fingers are numb or tingling.  Your arm, hand, or fingers are significantly swollen or turn white or blue. MAKE SURE YOU:   Understand these instructions.  Will watch your condition.  Will get help right away if you are not doing well or get worse. Document Released: 08/04/2005 Document Revised: 03/11/2014 Document Reviewed: 10/09/2011 [facility] Patient Information 2015 [place], [place]. This information is not intended to replace advice given to you by your health care provider. Make sure you discuss any questions you have with your health care provider.

## 2014-11-22 NOTE — Progress Notes (Signed)
Patient ID: [name], female   DOB: [birth year], 64 y.o.   MRN: [id number]  [name] - 64 y.o. female MRN [id number]  Date of birth: 1950-12-14    SUBJECTIVE:     Right shoulder pain for the last couple of months. She has history of bilateral shoulder pain but the last 2 months the right shoulder has become very problematic. She is having to ask family members for assistance to help her dress bade and toilet. This is very embarrassing to her. She recalls no specific injury to the shoulder. She is right-hand dominant. She can find no comfortable position in which to sleep at night. Pain is with any movement either forward or abduction and she is unable to get past horizontal.  ROS:     No fever, sweats, chills, unusual weight change.She's not having any numbness or tingling in her right upper extremity. She's noted no erythema or warmth of the right shoulder.  PERTINENT  PMH / PSH FH / / SH:  Past Medical, Surgical, Social, and Family History Reviewed & Updated in the EMR.  Pertinent findings include:  History of breast cancer Diabetes mellitus COPD Depression  OBJECTIVE: BP 132/79 mmHg  Pulse 106  Ht 5\' 7"  (1.702 m)  Wt 220 lb (99.791 kg)  BMI 34.45 kg/m2  Physical Exam:  Vital signs are reviewed. Well-developed overweight female no acute distress although she is somewhat tearful. Shoulders: Symmetrical. Left shoulder has full range of motion. Right shoulder and only passively be elevated to 100 in abduction and 95 in for flexion. Difficult to assess her rotator cuff muscle strength secondary to fairly severe pain with any type of movement. Neuro: She has intact soft touch sensation bilateral hands. She has normal grip strength bilateral hands. Psychiatric: Alert and oriented 4. Tearful. Asks and answers questions appropriately.  ULTRASOUND: Difficult to do complete exam because she was unable to really get in position to see the supraspinatus well. I see no gross  deformity of the rotator cuff. There are some mild to moderate arthritic changes in the before meals joint. There is no fluid noted in the glenohumeral joint.  INJECTION: Ultrasound-guided Patient was given informed consent, signed copy in the chart. Appropriate time out was taken. Area prepped and draped in usual sterile fashion. 1 cc of methylprednisolone 40 mg/ml plus  8 cc of 1% lidocaine without epinephrine was injected into the right glenohumeral space using a(n) ultrasound-guided superior posterior approach. The patient tolerated the procedure well. There were no complications. Post procedure instructions were given.   ASSESSMENT & PLAN:  See problem based charting & AVS for pt instructions.

## 2014-11-22 NOTE — Assessment & Plan Note (Signed)
-  f/u in 1 month

## 2014-11-22 NOTE — Assessment & Plan Note (Signed)
Pt is down to 2 cig/day and will wean down to 1 cig/day on Monday. -congratulated her on weaning herself down

## 2014-11-22 NOTE — Assessment & Plan Note (Addendum)
Pt p/w bilateral shoulder pain R>L.  She reports chronic L shoulder pain that comes and goes for the past couple of years.  Also reports R shoulder pain recently.  Denies any trauma, paresthesias, weakness, neck pain, fever/chills.  States she has tried tylenol, BC powder, and 2 tabs of ibuprofen.  She is requesting a narcotic pain med.  I explained to her that we would need to try NSAIDS scheduled first since opioids are not first line.  She seemed upset by this.  I do not question that she is having pain but explained to her that I have to provide careful documentation of the reasons for providing opioids.  I am unsure of the etiology of her pain.  I would feel more comfortable given the degree of pain she is in to have her f/u with [facility].  Fortunately, they are able to see her now.  I will defer further tx to [facility].  I would have recommended XR (even though UpToDate only recommends in cases of trauma) of the shoulders and scheduled NSAIDS in addition to heat and continued exercises to avoid adhesive capsulitis.  An injection of toradol may have provided some relief here in the clinic.  May also benefit from PT.  Arthritis, tendinitis are possible causes.   -referral to [facility] today  -f/u in 1 month

## 2014-11-22 NOTE — Progress Notes (Addendum)
Patient ID: [name], female   DOB: 12/10/1950, 64 y.o.   MRN: [id number]    Subjective:   [name]    DOB: Feb 22, 1951 64 y.o.    MRN: [id number] Health Maintenance Due: Health Maintenance Due  Topic Date Due  . PAP SMEAR  12/30/1968  . COLONOSCOPY  12/30/2000  . ZOSTAVAX  12/30/2010  . OPHTHALMOLOGY EXAM  02/08/2014    _________________________________________________  HPI: [name] is a 64 y.o. female here for an acute visit.  Pt has a PMH outlined below.  Please see problem-based charting assessment and plan note for further details of medical issues addressed at today's visit.  PMH: Past Medical History  Diagnosis Date  . Hyperlipidemia   . Hypertension   . Tobacco abuse   . COPD (chronic obstructive pulmonary disease)     History of multiple hospital admissions for exercabation   . Asthma   . Breast cancer 1991    s/p lumpectomy, chemotherapy and radiation therapy in 1991. Mammogram in 2007 was normal.  . Sigmoid diverticulitis 80/2008  . Anxiety   . Depression   . Obesity   . GERD (gastroesophageal reflux disease)   . Heart murmur 10/05/11    "first time I ever heard I had one was today"  . Pneumonia   . Shortness of breath 10/05/11    "at rest; lying down; w/exertion"  . Diabetes mellitus   . Bronchitis     h/o  . Diarrhea     h/o  . Constipated     h/o  . COPD with exacerbation 04/06/2009    Qualifier: Diagnosis of  By: Eyvonne Mechanic MD, Vijay      Medications: Current Outpatient Prescriptions on File Prior to Visit  Medication Sig Dispense Refill  . ACCU-CHEK FASTCLIX LANCETS MISC 1 each by Other route See admin instructions. Check blood sugar daily as needed for high blood sugar.    . albuterol (PROVENTIL) (2.5 MG/3ML) 0.083% nebulizer solution Take 3 mLs (2.5 mg total) by nebulization every 6 (six) hours as needed for wheezing or shortness of breath. 1 vial 0  . albuterol (VENTOLIN HFA) 108 (90 BASE)  MCG/ACT inhaler Inhale 2 puffs into the lungs every 6 (six) hours as needed for wheezing or shortness of breath. 1 Inhaler 3  . Blood Glucose Monitoring Suppl (ACCU-CHEK NANO SMARTVIEW) W/DEVICE KIT 1 each by Other route See admin instructions. Check blood sugar daily as needed for high blood sugar.    . budesonide-formoterol (SYMBICORT) 80-4.5 MCG/ACT inhaler Inhale 2 puffs into the lungs 2 (two) times daily as needed. For asthma (Patient taking differently: Inhale 2 puffs into the lungs 2 (two) times daily. For asthma) 1 Inhaler 12  . carvedilol (COREG) 6.25 MG tablet Take 1 tablet (6.25 mg total) by mouth 2 (two) times daily with a meal. 60 tablet 0  . citalopram (CELEXA) 40 MG tablet TAKE 1 TABLET BY MOUTH DAILY 30 tablet 3  . cyclobenzaprine (FLEXERIL) 5 MG tablet Take 1 tablet (5 mg total) by mouth at bedtime as needed for muscle spasms. 30 tablet 0  . fluticasone (FLONASE) 50 MCG/ACT nasal spray Place 1 spray into both nostrils daily as needed for allergies. (Patient taking differently: Place 1 spray into both nostrils daily. ) 16 g 2  . glucose blood (ACCU-CHEK SMARTVIEW) test strip Use to check blood sugar 1 to 2 times daily. diag code E 11.9. Non- insulin dependent 100 each 6  . ipratropium-albuterol (DUONEB) 0.5-2.5 (3)  MG/3ML SOLN Take 3 mLs by nebulization every 4 (four) hours as needed. (Patient taking differently: Take 3 mLs by nebulization every 4 (four) hours as needed (shortness of breath). ) 360 mL 0  . lisinopril-hydrochlorothiazide (PRINZIDE,ZESTORETIC) 20-25 MG per tablet Take 1 tablet by mouth daily. 30 tablet 0  . metFORMIN (GLUCOPHAGE) 1000 MG tablet Take 1 tablet (1,000 mg total) by mouth 2 (two) times daily with a meal. 60 tablet 1  . pantoprazole (PROTONIX) 40 MG tablet Take 1 tablet (40 mg total) by mouth daily. 30 tablet 0  . pravastatin (PRAVACHOL) 40 MG tablet Take 40 mg by mouth daily.    . predniSONE (DELTASONE) 20 MG tablet Take 2 tablets (40 mg total) by mouth daily.  10 tablet 0  . sodium chloride (OCEAN) 0.65 % SOLN nasal spray Place 1 spray into both nostrils as needed for congestion. (Patient taking differently: Place 1 spray into both nostrils daily as needed for congestion. ) 15 mL 3  . tiotropium (SPIRIVA) 18 MCG inhalation capsule Place 18 mcg into inhaler and inhale daily.    . [DISCONTINUED] albuterol (PROVENTIL,VENTOLIN) 90 MCG/ACT inhaler Inhale 2 puffs into the lungs every 6 (six) hours as needed for wheezing. 17 g 12   No current facility-administered medications on file prior to visit.    Allergies: No Known Allergies  FH: Family History  Problem Relation Age of Onset  . Cancer Mother     SH: History   Social History  . Marital Status: Married    Spouse Name: N/A    Number of Children: N/A  . Years of Education: N/A   Social History Main Topics  . Smoking status: Current Every Day Smoker -- 0.20 packs/day for 40 years    Types: Cigarettes    Last Attempt to Quit: 09/11/2014  . Smokeless tobacco: Never Used     Comment: slowly quitting.  . Alcohol Use: 2.4 oz/week    4 Cans of beer per week     Comment: "only on the weekends"  . Drug Use: No  . Sexual Activity: None   Other Topics Concern  . None   Social History Narrative   Lives in [place] with her husband.   Takes care of 3 grand children.   Trying to find a job, has financial difficulties.          Review of Systems: Constitutional: Negative for fever, chills and weight loss.  Eyes: Negative for blurred vision.  Respiratory: Negative for cough and shortness of breath.  Cardiovascular: Negative for chest pain, palpitations and leg swelling.  Gastrointestinal: Negative for nausea, vomiting, abdominal pain, diarrhea, constipation and blood in stool.  Genitourinary: Negative for dysuria, urgency and frequency.  Musculoskeletal: Negative for myalgias and back pain.  Neurological: Negative for dizziness, weakness and headaches.     Objective:   Vital  Signs: Filed Vitals:   11/22/14 1039  BP: 129/74  Pulse: 94  Temp: 98.4 F (36.9 C)  TempSrc: Oral  Height: 5' 7" (1.702 m)  Weight: 220 lb 1.6 oz (99.837 kg)  SpO2: 98%     BP Readings from Last 3 Encounters:  11/22/14 132/79  11/22/14 129/74  11/11/14 165/81    Physical Exam: Constitutional: Vital signs reviewed.  Patient is well-developed and well-nourished.  Appears in distress from her shoulder pain.    Head: Normocephalic and atraumatic. Eyes: PERRL, EOMI, conjunctivae nl, no scleral icterus.  Neck: Supple. Cardiovascular: RRR, no MRG. Pulmonary/Chest: normal effort, CTAB, no wheezes, rales, or rhonchi.   Abdominal: Soft. NT/ND +BS. Musculoskeletal: No deformity, swelling, erythema of the AC or glenohumerol joint.  RUE/LUE 5/5 strength,neurovascularly intact.  Decreased ROM of shoulder b/l, R>L.   Neurological: A&O x3, cranial nerves II-XII are grossly intact, moving all extremities. Extremities: 2+DP b/l; no pitting edema. Skin: Warm, dry and intact. No rash.   Assessment & Plan:   Assessment and plan was discussed and formulated with my attending.     

## 2014-12-04 ENCOUNTER — Other Ambulatory Visit: Payer: Self-pay | Admitting: Internal Medicine

## 2014-12-10 NOTE — Addendum Note (Signed)
Addended by: Jones Bales on: 12/10/2014 02:01 PM   Modules accepted: Level of Service

## 2014-12-10 NOTE — Progress Notes (Signed)
INTERNAL MEDICINE TEACHING ATTENDING ADDENDUM - Melane Windholz, MD: I reviewed and discussed at the time of visit with the resident Dr. Gill, the patient's medical history, physical examination, diagnosis and results of pertinent tests and treatment and I agree with the patient's care as documented.  

## 2014-12-12 ENCOUNTER — Encounter: Payer: Self-pay | Admitting: *Deleted

## 2014-12-13 ENCOUNTER — Other Ambulatory Visit: Payer: Self-pay | Admitting: Internal Medicine

## 2014-12-16 ENCOUNTER — Encounter: Payer: Medicare Other | Admitting: Internal Medicine

## 2014-12-24 ENCOUNTER — Telehealth: Payer: Self-pay | Admitting: *Deleted

## 2014-12-24 ENCOUNTER — Encounter (HOSPITAL_COMMUNITY): Payer: Self-pay | Admitting: General Practice

## 2014-12-24 ENCOUNTER — Encounter: Payer: Self-pay | Admitting: Internal Medicine

## 2014-12-24 ENCOUNTER — Ambulatory Visit (INDEPENDENT_AMBULATORY_CARE_PROVIDER_SITE_OTHER): Payer: Medicare Other | Admitting: Internal Medicine

## 2014-12-24 ENCOUNTER — Observation Stay (HOSPITAL_COMMUNITY)
Admission: AD | Admit: 2014-12-24 | Discharge: 2014-12-26 | Disposition: A | Discharge status: Home / Self Care | Payer: Medicare Other | Source: Ambulatory Visit | Attending: Internal Medicine | Admitting: Internal Medicine

## 2014-12-24 ENCOUNTER — Observation Stay (HOSPITAL_COMMUNITY): Payer: Medicare Other

## 2014-12-24 VITALS — BP 162/91 | HR 107 | Temp 98.3°F | Ht 67.0 in | Wt 216.6 lb

## 2014-12-24 DIAGNOSIS — R220 Localized swelling, mass and lump, head: Secondary | ICD-10-CM | POA: Diagnosis present

## 2014-12-24 DIAGNOSIS — E669 Obesity, unspecified: Secondary | ICD-10-CM | POA: Insufficient documentation

## 2014-12-24 DIAGNOSIS — R05 Cough: Secondary | ICD-10-CM | POA: Insufficient documentation

## 2014-12-24 DIAGNOSIS — T50995A Adverse effect of other drugs, medicaments and biological substances, initial encounter: Secondary | ICD-10-CM | POA: Insufficient documentation

## 2014-12-24 DIAGNOSIS — F17200 Nicotine dependence, unspecified, uncomplicated: Secondary | ICD-10-CM | POA: Diagnosis present

## 2014-12-24 DIAGNOSIS — K029 Dental caries, unspecified: Secondary | ICD-10-CM | POA: Insufficient documentation

## 2014-12-24 DIAGNOSIS — J9600 Acute respiratory failure, unspecified whether with hypoxia or hypercapnia: Secondary | ICD-10-CM | POA: Diagnosis not present

## 2014-12-24 DIAGNOSIS — T783XXA Angioneurotic edema, initial encounter: Secondary | ICD-10-CM | POA: Insufficient documentation

## 2014-12-24 DIAGNOSIS — E1169 Type 2 diabetes mellitus with other specified complication: Secondary | ICD-10-CM | POA: Diagnosis present

## 2014-12-24 DIAGNOSIS — I10 Essential (primary) hypertension: Secondary | ICD-10-CM | POA: Diagnosis not present

## 2014-12-24 DIAGNOSIS — F17210 Nicotine dependence, cigarettes, uncomplicated: Secondary | ICD-10-CM | POA: Diagnosis not present

## 2014-12-24 DIAGNOSIS — E119 Type 2 diabetes mellitus without complications: Secondary | ICD-10-CM

## 2014-12-24 DIAGNOSIS — J441 Chronic obstructive pulmonary disease with (acute) exacerbation: Secondary | ICD-10-CM | POA: Diagnosis present

## 2014-12-24 DIAGNOSIS — F419 Anxiety disorder, unspecified: Secondary | ICD-10-CM | POA: Diagnosis not present

## 2014-12-24 DIAGNOSIS — R0602 Shortness of breath: Secondary | ICD-10-CM | POA: Insufficient documentation

## 2014-12-24 DIAGNOSIS — E785 Hyperlipidemia, unspecified: Secondary | ICD-10-CM | POA: Insufficient documentation

## 2014-12-24 DIAGNOSIS — J80 Acute respiratory distress syndrome: Secondary | ICD-10-CM | POA: Diagnosis not present

## 2014-12-24 DIAGNOSIS — E1122 Type 2 diabetes mellitus with diabetic chronic kidney disease: Secondary | ICD-10-CM | POA: Diagnosis present

## 2014-12-24 DIAGNOSIS — R0600 Dyspnea, unspecified: Secondary | ICD-10-CM | POA: Insufficient documentation

## 2014-12-24 DIAGNOSIS — J449 Chronic obstructive pulmonary disease, unspecified: Secondary | ICD-10-CM | POA: Diagnosis present

## 2014-12-24 DIAGNOSIS — Z79899 Other long term (current) drug therapy: Secondary | ICD-10-CM | POA: Insufficient documentation

## 2014-12-24 DIAGNOSIS — J45909 Unspecified asthma, uncomplicated: Secondary | ICD-10-CM | POA: Insufficient documentation

## 2014-12-24 DIAGNOSIS — Z720 Tobacco use: Secondary | ICD-10-CM | POA: Diagnosis present

## 2014-12-24 DIAGNOSIS — F329 Major depressive disorder, single episode, unspecified: Secondary | ICD-10-CM | POA: Insufficient documentation

## 2014-12-24 DIAGNOSIS — K219 Gastro-esophageal reflux disease without esophagitis: Secondary | ICD-10-CM | POA: Diagnosis not present

## 2014-12-24 DIAGNOSIS — F411 Generalized anxiety disorder: Secondary | ICD-10-CM | POA: Diagnosis present

## 2014-12-24 DIAGNOSIS — M7501 Adhesive capsulitis of right shoulder: Secondary | ICD-10-CM | POA: Diagnosis not present

## 2014-12-24 DIAGNOSIS — E876 Hypokalemia: Secondary | ICD-10-CM | POA: Diagnosis not present

## 2014-12-24 DIAGNOSIS — R0603 Acute respiratory distress: Secondary | ICD-10-CM

## 2014-12-24 LAB — CBC WITH DIFFERENTIAL/PLATELET
BASOS ABS: 0 10*3/uL (ref 0.0–0.1)
BASOS PCT: 0 % (ref 0–1)
EOS ABS: 0.3 10*3/uL (ref 0.0–0.7)
Eosinophils Relative: 3 % (ref 0–5)
HEMATOCRIT: 35.3 % — AB (ref 36.0–46.0)
HEMOGLOBIN: 11.8 g/dL — AB (ref 12.0–15.0)
LYMPHS ABS: 3 10*3/uL (ref 0.7–4.0)
Lymphocytes Relative: 38 % (ref 12–46)
MCH: 28.2 pg (ref 26.0–34.0)
MCHC: 33.4 g/dL (ref 30.0–36.0)
MCV: 84.2 fL (ref 78.0–100.0)
Monocytes Absolute: 0.4 10*3/uL (ref 0.1–1.0)
Monocytes Relative: 5 % (ref 3–12)
NEUTROS PCT: 54 % (ref 43–77)
Neutro Abs: 4.3 10*3/uL (ref 1.7–7.7)
Platelets: 370 10*3/uL (ref 150–400)
RBC: 4.19 MIL/uL (ref 3.87–5.11)
RDW: 13.1 % (ref 11.5–15.5)
WBC: 7.9 10*3/uL (ref 4.0–10.5)

## 2014-12-24 LAB — COMPREHENSIVE METABOLIC PANEL
ALBUMIN: 3.7 g/dL (ref 3.5–5.2)
ALT: 8 U/L (ref 0–35)
AST: 13 U/L (ref 0–37)
Alkaline Phosphatase: 80 U/L (ref 39–117)
Anion gap: 10 (ref 5–15)
BUN: <5 mg/dL — ABNORMAL LOW (ref 6–23)
CALCIUM: 9.5 mg/dL (ref 8.4–10.5)
CO2: 32 mmol/L (ref 19–32)
Chloride: 98 mmol/L (ref 96–112)
Creatinine, Ser: 0.77 mg/dL (ref 0.50–1.10)
GFR calc Af Amer: >90 mL/min (ref >90)
GFR, EST NON AFRICAN AMERICAN: 88 mL/min — AB (ref >90)
Glucose, Bld: 100 mg/dL — ABNORMAL HIGH (ref 70–99)
POTASSIUM: 3.4 mmol/L — AB (ref 3.5–5.1)
SODIUM: 140 mmol/L (ref 135–145)
Total Bilirubin: 0.6 mg/dL (ref 0.3–1.2)
Total Protein: 7.4 g/dL (ref 6.0–8.3)

## 2014-12-24 LAB — GLUCOSE, CAPILLARY
GLUCOSE-CAPILLARY: 247 mg/dL — AB (ref 70–99)
GLUCOSE-CAPILLARY: 318 mg/dL — AB (ref 70–99)
Glucose-Capillary: 93 mg/dL (ref 70–99)

## 2014-12-24 LAB — POCT GLYCOSYLATED HEMOGLOBIN (HGB A1C): HEMOGLOBIN A1C: 6.9

## 2014-12-24 LAB — MRSA PCR SCREENING: MRSA BY PCR: NEGATIVE

## 2014-12-24 MED ORDER — CITALOPRAM HYDROBROMIDE 20 MG PO TABS
40.0000 mg | ORAL_TABLET | Freq: Every day | ORAL | Status: DC
  Administered 2014-12-25: 40 mg via ORAL
  Filled 2014-12-24: qty 2

## 2014-12-24 MED ORDER — IPRATROPIUM-ALBUTEROL 0.5-2.5 (3) MG/3ML IN SOLN
RESPIRATORY_TRACT | Status: AC
  Filled 2014-12-24: qty 3

## 2014-12-24 MED ORDER — SALINE SPRAY 0.65 % NA SOLN
1.0000 | NASAL | Status: DC | PRN
  Filled 2014-12-24: qty 44

## 2014-12-24 MED ORDER — ALBUTEROL SULFATE (2.5 MG/3ML) 0.083% IN NEBU: 2.5000 mg | INHALATION_SOLUTION | RESPIRATORY_TRACT | Status: DC | PRN

## 2014-12-24 MED ORDER — FLUTICASONE PROPIONATE 50 MCG/ACT NA SUSP
1.0000 | Freq: Every day | NASAL | Status: DC
  Administered 2014-12-25 – 2014-12-26 (×2): 1 via NASAL
  Filled 2014-12-24: qty 16

## 2014-12-24 MED ORDER — METHYLPREDNISOLONE SODIUM SUCC 125 MG IJ SOLR
125.0000 mg | Freq: Once | INTRAMUSCULAR | Status: AC
  Administered 2014-12-24: 125 mg via INTRAMUSCULAR

## 2014-12-24 MED ORDER — ONDANSETRON HCL 4 MG/2ML IJ SOLN: 4.0000 mg | Freq: Four times a day (QID) | INTRAMUSCULAR | Status: DC | PRN

## 2014-12-24 MED ORDER — INSULIN ASPART 100 UNIT/ML ~~LOC~~ SOLN
0.0000 [IU] | Freq: Every day | SUBCUTANEOUS | Status: DC
  Administered 2014-12-24: 4 [IU] via SUBCUTANEOUS
  Administered 2014-12-25: 3 [IU] via SUBCUTANEOUS

## 2014-12-24 MED ORDER — IBUPROFEN 600 MG PO TABS
600.0000 mg | ORAL_TABLET | Freq: Four times a day (QID) | ORAL | Status: DC | PRN
  Administered 2014-12-24 – 2014-12-25 (×2): 600 mg via ORAL
  Filled 2014-12-24 (×2): qty 1

## 2014-12-24 MED ORDER — PANTOPRAZOLE SODIUM 40 MG PO TBEC
40.0000 mg | DELAYED_RELEASE_TABLET | Freq: Every day | ORAL | Status: DC
  Administered 2014-12-25 – 2014-12-26 (×2): 40 mg via ORAL
  Filled 2014-12-24 (×2): qty 1

## 2014-12-24 MED ORDER — IPRATROPIUM BROMIDE 0.02 % IN SOLN
0.5000 mg | Freq: Once | RESPIRATORY_TRACT | Status: AC
  Administered 2014-12-24: 0.5 mg via RESPIRATORY_TRACT

## 2014-12-24 MED ORDER — SODIUM CHLORIDE 0.9 % IJ SOLN: 3.0000 mL | INTRAMUSCULAR | Status: DC | PRN

## 2014-12-24 MED ORDER — HYDROCHLOROTHIAZIDE 25 MG PO TABS
25.0000 mg | ORAL_TABLET | Freq: Every day | ORAL | Status: DC
  Administered 2014-12-25 – 2014-12-26 (×2): 25 mg via ORAL
  Filled 2014-12-24 (×2): qty 1

## 2014-12-24 MED ORDER — ACETAMINOPHEN 325 MG PO TABS: 650.0000 mg | ORAL_TABLET | Freq: Four times a day (QID) | ORAL | Status: DC | PRN

## 2014-12-24 MED ORDER — TRAZODONE HCL 50 MG PO TABS: 50.0000 mg | ORAL_TABLET | Freq: Every evening | ORAL | Status: DC | PRN

## 2014-12-24 MED ORDER — INSULIN ASPART 100 UNIT/ML ~~LOC~~ SOLN
0.0000 [IU] | Freq: Three times a day (TID) | SUBCUTANEOUS | Status: DC
  Administered 2014-12-25: 1 [IU] via SUBCUTANEOUS
  Administered 2014-12-25: 5 [IU] via SUBCUTANEOUS
  Administered 2014-12-25 – 2014-12-26 (×2): 2 [IU] via SUBCUTANEOUS

## 2014-12-24 MED ORDER — BUDESONIDE-FORMOTEROL FUMARATE 80-4.5 MCG/ACT IN AERO
2.0000 | INHALATION_SPRAY | Freq: Two times a day (BID) | RESPIRATORY_TRACT | Status: DC
  Administered 2014-12-25 – 2014-12-26 (×3): 2 via RESPIRATORY_TRACT
  Filled 2014-12-24: qty 6.9

## 2014-12-24 MED ORDER — IPRATROPIUM-ALBUTEROL 0.5-2.5 (3) MG/3ML IN SOLN
3.0000 mL | RESPIRATORY_TRACT | Status: DC
  Administered 2014-12-24: 3 mL via RESPIRATORY_TRACT
  Filled 2014-12-24: qty 3

## 2014-12-24 MED ORDER — POTASSIUM CHLORIDE CRYS ER 20 MEQ PO TBCR
40.0000 meq | EXTENDED_RELEASE_TABLET | Freq: Once | ORAL | Status: DC
  Filled 2014-12-24: qty 2

## 2014-12-24 MED ORDER — SODIUM CHLORIDE 0.9 % IV SOLN: 250.0000 mL | INTRAVENOUS | Status: DC | PRN

## 2014-12-24 MED ORDER — METHYLPREDNISOLONE SODIUM SUCC 125 MG IJ SOLR: 125.0000 mg | Freq: Once | INTRAMUSCULAR | Status: DC

## 2014-12-24 MED ORDER — ALBUTEROL SULFATE (2.5 MG/3ML) 0.083% IN NEBU
2.5000 mg | INHALATION_SOLUTION | Freq: Once | RESPIRATORY_TRACT | Status: AC
  Administered 2014-12-24: 2.5 mg via RESPIRATORY_TRACT

## 2014-12-24 MED ORDER — PRAVASTATIN SODIUM 40 MG PO TABS
40.0000 mg | ORAL_TABLET | Freq: Every day | ORAL | Status: DC
  Administered 2014-12-25 – 2014-12-26 (×2): 40 mg via ORAL
  Filled 2014-12-24 (×2): qty 1

## 2014-12-24 MED ORDER — HEPARIN SODIUM (PORCINE) 5000 UNIT/ML IJ SOLN
5000.0000 [IU] | Freq: Three times a day (TID) | INTRAMUSCULAR | Status: DC
  Administered 2014-12-24 – 2014-12-26 (×5): 5000 [IU] via SUBCUTANEOUS
  Filled 2014-12-24 (×5): qty 1

## 2014-12-24 MED ORDER — BENZONATATE 100 MG PO CAPS
100.0000 mg | ORAL_CAPSULE | Freq: Two times a day (BID) | ORAL | Status: DC
Start: 2014-12-24 — End: 2014-12-26
  Administered 2014-12-24 – 2014-12-26 (×4): 100 mg via ORAL
  Filled 2014-12-24 (×4): qty 1

## 2014-12-24 MED ORDER — PREDNISONE 20 MG PO TABS
40.0000 mg | ORAL_TABLET | Freq: Every day | ORAL | Status: DC
  Administered 2014-12-25 – 2014-12-26 (×2): 40 mg via ORAL
  Filled 2014-12-24 (×2): qty 2

## 2014-12-24 MED ORDER — DOXYCYCLINE HYCLATE 100 MG PO TABS
100.0000 mg | ORAL_TABLET | Freq: Two times a day (BID) | ORAL | Status: DC
  Administered 2014-12-24 – 2014-12-26 (×4): 100 mg via ORAL
  Filled 2014-12-24 (×4): qty 1

## 2014-12-24 MED ORDER — BENZOCAINE 10 % MT GEL
Freq: Two times a day (BID) | OROMUCOSAL | Status: DC | PRN
  Filled 2014-12-24: qty 9.4

## 2014-12-24 MED ORDER — SODIUM CHLORIDE 0.9 % IV SOLN: 125.0000 mg | Freq: Once | INTRAVENOUS | Status: DC

## 2014-12-24 MED ORDER — SODIUM CHLORIDE 0.9 % IJ SOLN
3.0000 mL | Freq: Two times a day (BID) | INTRAMUSCULAR | Status: DC
  Administered 2014-12-24 – 2014-12-26 (×4): 3 mL via INTRAVENOUS

## 2014-12-24 MED ORDER — CARVEDILOL 6.25 MG PO TABS
6.2500 mg | ORAL_TABLET | Freq: Two times a day (BID) | ORAL | Status: DC
  Administered 2014-12-24 – 2014-12-26 (×4): 6.25 mg via ORAL
  Filled 2014-12-24 (×4): qty 1

## 2014-12-24 MED ORDER — IPRATROPIUM-ALBUTEROL 0.5-2.5 (3) MG/3ML IN SOLN
3.0000 mL | Freq: Four times a day (QID) | RESPIRATORY_TRACT | Status: DC
  Administered 2014-12-24 – 2014-12-26 (×8): 3 mL via RESPIRATORY_TRACT
  Filled 2014-12-24 (×7): qty 3

## 2014-12-24 MED ORDER — ONDANSETRON HCL 4 MG PO TABS: 4.0000 mg | ORAL_TABLET | Freq: Four times a day (QID) | ORAL | Status: DC | PRN

## 2014-12-24 NOTE — Addendum Note (Signed)
Addended by: Marcelino Duster on: 12/24/2014 11:57 AM   Modules accepted: Orders

## 2014-12-24 NOTE — Assessment & Plan Note (Signed)
Patient presenting with ongoing dyspnea with some signs of COPD exacerbation including increased dyspnea and sputum production. There may be slight concern for bronchitis or pneumonia. -The patient will be admitted for further management -CBC with differential, CMet, duo nebs, and 125 mg of Solu-Medrol were all given in clinic

## 2014-12-24 NOTE — Telephone Encounter (Signed)
Pt presented to front office c/o a cold for 1 week, audible wheezes, does not know if fevers are present, hard to breathe also lower gums swollen, possible draining, painful. "i feel horrible"

## 2014-12-24 NOTE — Progress Notes (Signed)
Subjective:   Patient ID: Christie GODSIL female   DOB: [birth year] 64 y.o.   MRN: [id number]  HPI: [name] is a 64 y.o. woman pmh as listed below presents for acute respiratory distress.  The patient's symptoms started about 7 days ago. She has had multiple sick contacts being husband, daughter, grandson. She's had subjective fevers and chills with night sweats. She's been increasing her rescue inhaler to about 4 times per day and scheduled nebulizer treatments every 4 hours secondary to significant dyspnea with little to no success. She is also had some productive sputum that is yellow/green in character. She denies any chest pain, headache, lower extremity edema. She's had very poor sleep during this time as well and very poor appetite.  Patient is also noticed that about 4 days ago she had lower gum pain and then about 2 days ago some lower lip swelling. This has prevented her from eating and she is tried over-the-counter Goody powders with little relief.   Past Medical History  Diagnosis Date  . Hyperlipidemia   . Hypertension   . Tobacco abuse   . COPD (chronic obstructive pulmonary disease)     History of multiple hospital admissions for exercabation   . Asthma   . Breast cancer 1991    s/p lumpectomy, chemotherapy and radiation therapy in 1991. Mammogram in 2007 was normal.  . Sigmoid diverticulitis 80/2008  . Anxiety   . Depression   . Obesity   . GERD (gastroesophageal reflux disease)   . Heart murmur 10/05/11    "first time I ever heard I had one was today"  . Pneumonia   . Shortness of breath 10/05/11    "at rest; lying down; w/exertion"  . Diabetes mellitus   . Bronchitis     h/o  . Diarrhea     h/o  . Constipated     h/o  . COPD with exacerbation 04/06/2009    Qualifier: Diagnosis of  By: Eyvonne Mechanic MD, Vijay     Current Outpatient Prescriptions  Medication Sig Dispense Refill  . ACCU-CHEK FASTCLIX LANCETS MISC 1 each by Other route See admin  instructions. Check blood sugar daily as needed for high blood sugar.    . albuterol (PROVENTIL) (2.5 MG/3ML) 0.083% nebulizer solution Take 3 mLs (2.5 mg total) by nebulization every 6 (six) hours as needed for wheezing or shortness of breath. 1 vial 0  . albuterol (VENTOLIN HFA) 108 (90 BASE) MCG/ACT inhaler Inhale 2 puffs into the lungs every 6 (six) hours as needed for wheezing or shortness of breath. 1 Inhaler 3  . Blood Glucose Monitoring Suppl (ACCU-CHEK NANO SMARTVIEW) W/DEVICE KIT 1 each by Other route See admin instructions. Check blood sugar daily as needed for high blood sugar.    . budesonide-formoterol (SYMBICORT) 80-4.5 MCG/ACT inhaler Inhale 2 puffs into the lungs 2 (two) times daily as needed. For asthma (Patient taking differently: Inhale 2 puffs into the lungs 2 (two) times daily. For asthma) 1 Inhaler 12  . carvedilol (COREG) 6.25 MG tablet Take 1 tablet (6.25 mg total) by mouth 2 (two) times daily with a meal. 60 tablet 0  . citalopram (CELEXA) 40 MG tablet TAKE 1 TABLET BY MOUTH DAILY 30 tablet 3  . cyclobenzaprine (FLEXERIL) 5 MG tablet Take 1 tablet (5 mg total) by mouth at bedtime as needed for muscle spasms. 30 tablet 0  . diclofenac (VOLTAREN) 75 MG EC tablet Take 1 tablet (75 mg total) by mouth 3 (three) times daily.  90 tablet 1  . fluticasone (FLONASE) 50 MCG/ACT nasal spray Place 1 spray into both nostrils daily as needed for allergies. (Patient taking differently: Place 1 spray into both nostrils daily. ) 16 g 2  . glucose blood (ACCU-CHEK SMARTVIEW) test strip Use to check blood sugar 1 to 2 times daily. diag code E 11.9. Non- insulin dependent 100 each 6  . hydrochlorothiazide (HYDRODIURIL) 25 MG tablet     . ipratropium-albuterol (DUONEB) 0.5-2.5 (3) MG/3ML SOLN Take 3 mLs by nebulization every 4 (four) hours as needed. (Patient taking differently: Take 3 mLs by nebulization every 4 (four) hours as needed (shortness of breath). ) 360 mL 0  .  lisinopril-hydrochlorothiazide (PRINZIDE,ZESTORETIC) 20-25 MG per tablet TAKE ONE TABLET BY MOUTH ONCE DAILY 30 tablet 0  . metFORMIN (GLUCOPHAGE) 1000 MG tablet Take 1 tablet (1,000 mg total) by mouth 2 (two) times daily with a meal. 60 tablet 1  . metFORMIN (GLUCOPHAGE) 500 MG tablet     . methylPREDNISolone sodium succinate (SOLU-MEDROL) 125 mg/2 mL injection Inject 2 mLs (125 mg total) into the muscle once. 1 each 0  . pantoprazole (PROTONIX) 40 MG tablet Take 1 tablet (40 mg total) by mouth daily. 30 tablet 0  . pravastatin (PRAVACHOL) 40 MG tablet TAKE 1 TABLET BY MOUTH DAILY 90 tablet 1  . predniSONE (DELTASONE) 20 MG tablet Take 2 tablets (40 mg total) by mouth daily. 10 tablet 0  . sodium chloride (OCEAN) 0.65 % SOLN nasal spray Place 1 spray into both nostrils as needed for congestion. (Patient taking differently: Place 1 spray into both nostrils daily as needed for congestion. ) 15 mL 3  . tiotropium (SPIRIVA) 18 MCG inhalation capsule Place 18 mcg into inhaler and inhale daily.    . [DISCONTINUED] albuterol (PROVENTIL,VENTOLIN) 90 MCG/ACT inhaler Inhale 2 puffs into the lungs every 6 (six) hours as needed for wheezing. 17 g 12   Current Facility-Administered Medications  Medication Dose Route Frequency Provider Last Rate Last Dose  . albuterol (PROVENTIL) (2.5 MG/3ML) 0.083% nebulizer solution 2.5 mg  2.5 mg Nebulization Once Clinton Gallant, MD      . ipratropium (ATROVENT) nebulizer solution 0.5 mg  0.5 mg Nebulization Once Clinton Gallant, MD       Family History  Problem Relation Age of Onset  . Cancer Mother    History   Social History  . Marital Status: Married    Spouse Name: N/A  . Number of Children: N/A  . Years of Education: N/A   Social History Main Topics  . Smoking status: Current Every Day Smoker -- 0.10 packs/day for 40 years    Types: Cigarettes    Last Attempt to Quit: 09/11/2014  . Smokeless tobacco: Never Used     Comment: slowly quitting.  . Alcohol Use: 2.4  oz/week    4 Cans of beer per week     Comment: "only on the weekends"  . Drug Use: No  . Sexual Activity: Not on file   Other Topics Concern  . None   Social History Narrative   Lives in [place] with her husband.   Takes care of 3 grand children.   Trying to find a job, has financial difficulties.         Review of Systems: Pertinent items are noted in HPI. Objective:  Physical Exam: Filed Vitals:   12/24/14 1018  BP: 162/91  Pulse: 107  Temp: 98.3 F (36.8 C)  TempSrc: Oral  Height: _0  (1.702 m)  Weight: 216 lb 9.6 oz (98.249 kg)  SpO2: 99%   General: sitting in chair, some distress, talking in 2-3 word sentences HEENT: PERRL, EOMI, no scleral icterus, lower lip swollen, swollen gums no active site of drainage, necrotic teeth, very tender to palpation over gums Cardiac: RRR, no rubs, murmurs or gallops Pulm: Some crackles in the lower lobes and extensive wheezing, very prolonged expiratory phase, moving decreased volumes of air Abd: soft, nontender, nondistended, BS present Ext: warm and well perfused, no pedal edema Neuro: alert and oriented X3, cranial nerves II-XII grossly intact  Assessment & Plan:  Please see problem oriented charting  Pt discussed with Dr. Marinda Elk

## 2014-12-24 NOTE — Progress Notes (Signed)
Internal Medicine Clinic Attending  I saw and evaluated the patient.  I personally confirmed the key portions of the history and exam documented by Dr. Sadek and I reviewed pertinent patient test results.  The assessment, diagnosis, and plan were formulated together and I agree with the documentation in the resident's note. 

## 2014-12-24 NOTE — Assessment & Plan Note (Signed)
Concern for abscess will need a panoramic XRAY and dental evaulation when possible.  Would d/c ACE inhibitor if this has any part to play with lower lip swelling and switch to ARB this would also be recommended in the setting of COPD with frequent exacerbations.  -admitting team aware

## 2014-12-24 NOTE — Addendum Note (Signed)
Addended by: Jerrye Noble on: 12/24/2014 11:18 AM   Modules accepted: Orders, Medications

## 2014-12-24 NOTE — H&P (Signed)
Date: 12/24/2014               Patient Name:  [name] MRN: [id number]  DOB: Jun 02, 1951 Age / Sex: 64 y.o., female   PCP: Marrian Salvage, MD         Medical Service: Internal Medicine Teaching Service         Attending Physician: Dr. Burns Spain, MD    First Contact: Dr. Glenard Haring Pager: [phone]  Second Contact: Dr. Johna Roles Pager: [phone]       After Hours (After 5p/  First Contact Pager: [phone]  weekends / holidays): Second Contact Pager: [phone]   Chief Complaint: Respiratory distress   History of Present Illness:  64yo F w/ PMH HLD, HTN, DM2, h/o breast ca, grade 1 diastolic dysfunction, and COPD presents from clinic with respiratory distress.  She was seen in clinic today c/o SOB, dyspnea, and productive cough with green/yellow sputum with subjective fevers and night sweats that began 7 days ago. She has multiple sick contacts in her husband, daughter, and granddaughter who all have colds, per pt. She has been using her rescue inhaler 4x daily and nebulizer q4h at home 2/2 SOB and dyspnea without much improvement in her symptoms. She also endorses decreased appetite, but denies any chest pain, headaches, or peripheral edema.   She also endorses swelling at her lower gum line x4 days with lip swelling x2 days. She is taking an ACEi. She denies eating or drinking anything unusual or changes in medications. She states that she does have poor dentition and has multiple broken teeth. She does not see a dentist.   125mg  IV Solumedrol was given in the clinic prior to her arrival to a floor bed.   Meds: No current facility-administered medications for this encounter.   Current Outpatient Prescriptions  Medication Sig Dispense Refill  . ACCU-CHEK FASTCLIX LANCETS MISC 1 each by Other route See admin instructions. Check blood sugar daily as needed for high blood sugar.    . albuterol (PROVENTIL) (2.5 MG/3ML) 0.083% nebulizer solution Take 3 mLs (2.5 mg total) by  nebulization every 6 (six) hours as needed for wheezing or shortness of breath. 1 vial 0  . albuterol (VENTOLIN HFA) 108 (90 BASE) MCG/ACT inhaler Inhale 2 puffs into the lungs every 6 (six) hours as needed for wheezing or shortness of breath. 1 Inhaler 3  . Blood Glucose Monitoring Suppl (ACCU-CHEK NANO SMARTVIEW) W/DEVICE KIT 1 each by Other route See admin instructions. Check blood sugar daily as needed for high blood sugar.    . budesonide-formoterol (SYMBICORT) 80-4.5 MCG/ACT inhaler Inhale 2 puffs into the lungs 2 (two) times daily as needed. For asthma (Patient taking differently: Inhale 2 puffs into the lungs 2 (two) times daily. For asthma) 1 Inhaler 12  . carvedilol (COREG) 6.25 MG tablet Take 1 tablet (6.25 mg total) by mouth 2 (two) times daily with a meal. 60 tablet 0  . citalopram (CELEXA) 40 MG tablet TAKE 1 TABLET BY MOUTH DAILY 30 tablet 3  . cyclobenzaprine (FLEXERIL) 5 MG tablet Take 1 tablet (5 mg total) by mouth at bedtime as needed for muscle spasms. 30 tablet 0  . diclofenac (VOLTAREN) 75 MG EC tablet Take 1 tablet (75 mg total) by mouth 3 (three) times daily. 90 tablet 1  . fluticasone (FLONASE) 50 MCG/ACT nasal spray Place 1 spray into both nostrils daily as needed for allergies. (Patient taking differently: Place 1 spray into both nostrils daily. ) 16 g 2  .  glucose blood (ACCU-CHEK SMARTVIEW) test strip Use to check blood sugar 1 to 2 times daily. diag code E 11.9. Non- insulin dependent 100 each 6  . hydrochlorothiazide (HYDRODIURIL) 25 MG tablet     . ipratropium-albuterol (DUONEB) 0.5-2.5 (3) MG/3ML SOLN Take 3 mLs by nebulization every 4 (four) hours as needed. (Patient taking differently: Take 3 mLs by nebulization every 4 (four) hours as needed (shortness of breath). ) 360 mL 0  . lisinopril-hydrochlorothiazide (PRINZIDE,ZESTORETIC) 20-25 MG per tablet TAKE ONE TABLET BY MOUTH ONCE DAILY 30 tablet 0  . metFORMIN (GLUCOPHAGE) 1000 MG tablet Take 1 tablet (1,000 mg total)  by mouth 2 (two) times daily with a meal. 60 tablet 1  . metFORMIN (GLUCOPHAGE) 500 MG tablet     . pantoprazole (PROTONIX) 40 MG tablet Take 1 tablet (40 mg total) by mouth daily. 30 tablet 0  . pravastatin (PRAVACHOL) 40 MG tablet TAKE 1 TABLET BY MOUTH DAILY 90 tablet 1  . predniSONE (DELTASONE) 20 MG tablet Take 2 tablets (40 mg total) by mouth daily. 10 tablet 0  . sodium chloride (OCEAN) 0.65 % SOLN nasal spray Place 1 spray into both nostrils as needed for congestion. (Patient taking differently: Place 1 spray into both nostrils daily as needed for congestion. ) 15 mL 3  . tiotropium (SPIRIVA) 18 MCG inhalation capsule Place 18 mcg into inhaler and inhale daily.    . [DISCONTINUED] albuterol (PROVENTIL,VENTOLIN) 90 MCG/ACT inhaler Inhale 2 puffs into the lungs every 6 (six) hours as needed for wheezing. 17 g 12    Allergies: Allergies as of 12/24/2014  . (No Known Allergies)   Past Medical History  Diagnosis Date  . Hyperlipidemia   . Hypertension   . Tobacco abuse   . COPD (chronic obstructive pulmonary disease)     History of multiple hospital admissions for exercabation   . Asthma   . Breast cancer 1991    s/p lumpectomy, chemotherapy and radiation therapy in 1991. Mammogram in 2007 was normal.  . Sigmoid diverticulitis 80/2008  . Anxiety   . Depression   . Obesity   . GERD (gastroesophageal reflux disease)   . Heart murmur 10/05/11    "first time I ever heard I had one was today"  . Pneumonia   . Shortness of breath 10/05/11    "at rest; lying down; w/exertion"  . Diabetes mellitus   . Bronchitis     h/o  . Diarrhea     h/o  . Constipated     h/o  . COPD with exacerbation 04/06/2009    Qualifier: Diagnosis of  By: Comer Locket MD, Raelene Bott     Past Surgical History  Procedure Laterality Date  . Dobutamine stress echo  08/2004    Inferior ischemia, normal LV systolic function, no significant CAD  . Abdominal hysterectomy    . Breast surgery  1991    lumphectomy  right breast  . Neck surgery  2012    "Dr. [name]  put plate in; did something to my vertebrae"   Family History  Problem Relation Age of Onset  . Cancer Mother    History   Social History  . Marital Status: Married    Spouse Name: N/A  . Number of Children: N/A  . Years of Education: N/A   Occupational History  . Not on file.   Social History Main Topics  . Smoking status: Current Every Day Smoker -- 0.10 packs/day for 40 years    Types: Cigarettes  Last Attempt to Quit: 09/11/2014  . Smokeless tobacco: Never Used     Comment: slowly quitting.  . Alcohol Use: 2.4 oz/week    4 Cans of beer per week     Comment: "only on the weekends"  . Drug Use: No  . Sexual Activity: Not on file   Other Topics Concern  . Not on file   Social History Narrative   Lives in [place] with her husband.   Takes care of 3 grand children.   Trying to find a job, has financial difficulties.          Review of Systems: A 12 point ROS was performed; pertinent positives and negatives were noted in the HPI   Physical Exam: There were no vitals taken for this visit. General: Fatigued but alert & oriented x 3, well-developed, and cooperative on examination.  Head: Normocephalic and atraumatic.  Eyes: EOMI.  Mouth: Pharynx pink and moist, no erythema or exudates. Poor dentition with multiple broken teeth, few scattered areas of swelling with small ulcerations at the inferior gum line anteriorly Neck: Supple, full ROM, no thyromegaly, no JVD.  Lungs: Diminished air flow at the bases, scattered rales and wheezing present, no crackles Heart: Regular rate, regular rhythm, no murmur, no gallop, and no rub.  Abdomen: Soft, non-tender, non-distended, normal bowel sounds, no guarding, no rebound tenderness, no organomegaly.  Msk: No joint swelling, warmth, or erythema.  Extremities: 2+ radial pulses bilaterally. No edema Neurologic: Alert & oriented X3, cranial nerves II-XII intact, no focal  deficits.  Skin: Turgor normal.  Psych: Memory intact for recent and remote, normally interactive, good eye contact, not anxious or depressed appearing.   Lab results: Basic Metabolic Panel:  Recent Labs  21/30/86 1105  NA 140  K 3.4*  CL 98  CO2 32  GLUCOSE 100*  BUN <5*  CREATININE 0.77  CALCIUM 9.5   Liver Function Tests:  Recent Labs  12/24/14 1105  AST 13  ALT 8  ALKPHOS 80  BILITOT 0.6  PROT 7.4  ALBUMIN 3.7   CBC:  Recent Labs  12/24/14 1105  WBC 7.9  NEUTROABS 4.3  HGB 11.8*  HCT 35.3*  MCV 84.2  PLT 370   CBG:  Recent Labs  12/24/14 1034  GLUCAP 93   Hemoglobin A1C:  Recent Labs  12/24/14 1045  HGBA1C 6.9   Urine Drug Screen: Drugs of Abuse     Component Value Date/Time   LABOPIA NONE DETECTED 12/29/2009 0633   COCAINSCRNUR NONE DETECTED 12/29/2009 0633   LABBENZ NONE DETECTED 12/29/2009 0633   AMPHETMU NONE DETECTED 12/29/2009 0633   THCU NONE DETECTED 12/29/2009 0633   LABBARB  12/29/2009 0633    NONE DETECTED        DRUG SCREEN FOR MEDICAL PURPOSES ONLY.  IF CONFIRMATION IS NEEDED FOR ANY PURPOSE, NOTIFY LAB WITHIN 5 DAYS.        LOWEST DETECTABLE LIMITS FOR URINE DRUG SCREEN Drug Class       Cutoff (ng/mL) Amphetamine      1000 Barbiturate      200 Benzodiazepine   200 Tricyclics       300 Opiates          300 Cocaine          300 THC              50     Imaging results:  12/24/14: 2 view CXR pending  Other results: EKG: Pending  Assessment & Plan by  Problem: 64yo F w/ PMH HLD, HTN, DM2, h/o breast cancer, grade 1 diastolic dysfunction, and COPD presents from clinic with respiratory distress.  #COPD exacerbation: Pt with 7d increasing SOB and dyspnea with productive cough of green/yellow sputum presents from the clinic due to poor response to bronchodilators. She has had subjective fevers and night sweats at home, but in afebrile here, and vitals stable, not requiring supplemental O2. No leukocytosis on labs.  Pt with sick contacts at home, and in the setting of her COPD with continued cigarette use, likely COPD exacerbation. 125mg  IV Solumedrol given in the clinic today. Will start abx and scheduled nebulizer treatment- she may benefit from continuous nebs. - Admit to IMTS to observation - 2 view CXR - Doxycycline 100mg  q12h - Duonebs q4h - Depending on her respiratory status, will transition to po prednisone 40mg  daily starting tomorrow.  - Continue home Symbicort BID - Checking Mg - Continue Flonase and saline nasal spray - Smoking cessation from social work  #Swollen gums and lip:  Swelling of the lower gum line x4 days and swelling of the lip x2 days per report. Possible that this is an allergic reaction, but no new medications, changes in po intake, or changes in detergents or soaps. Steroids were given in the clinic prior to admission for her COPD exacerbation, which should help if this is an allergic reaction. She is on an ACEi, so angioedema is a concern, so will hold ACEi. Nephrotic syndrome is less likely w/o renal dysfunction or edema elsewhere. Other concern is that with the patient's poor dentition, this could be an abscess/infection. No airway compromise but will monitor closely. - [facility] dental imaging - Consider Dental f/u if abscess present - Hold ACEi  #Hypokalemia: K 3.4 on admission, in the setting of diuretic use. Replacing. - KCl x1.  - Repeat BMP in AM.   #DM2 (diabetes mellitus, type 2): Pt on Metformin 1000mg  BID at home. A1c today 6.9. While she is having respiratory difficulty, will keep NPO, hold metformin and begin SSI. - Hold Metformin - SSI-sensitive  #Hypertension, benign essential, goal below 140/90: BP elevated on admission to 162/91. She is on a combination ACEi-diuretic and BB. Will continue BB and diuretic. Holding ACEi as noted above. - HCTZ 25mg  daily - Coreg 6.25mg  BID  #Dyslipidemia associated with type 2 diabetes mellitus: Pt on pravastatin,  which was continued on admission. Last lipid panel in August '15 with mildly elevated triglycerides to 166 but otherwise normal. Last triglyceride check 193 in Nov '15.  - Continue Pravastatin 40mg  daily  #Depression: Stable. On Celexa 40mg  daily at home, which was continued on admission.   #DVT PPx: [place] Heparin  Dispo: Disposition is deferred at this time, awaiting improvement of current medical problems. Anticipated discharge in approximately 1-3 day(s).   The patient does have a current PCP (Marrian Salvage, MD) and does need an [facility] hospital follow-up appointment after discharge.  The patient does not have transportation limitations that hinder transportation to clinic appointments.  Signed: Genelle Gather, MD 12/24/2014, 1:32 PM

## 2014-12-25 DIAGNOSIS — E785 Hyperlipidemia, unspecified: Secondary | ICD-10-CM | POA: Diagnosis not present

## 2014-12-25 DIAGNOSIS — F329 Major depressive disorder, single episode, unspecified: Secondary | ICD-10-CM | POA: Diagnosis not present

## 2014-12-25 DIAGNOSIS — M25511 Pain in right shoulder: Secondary | ICD-10-CM | POA: Diagnosis not present

## 2014-12-25 DIAGNOSIS — E876 Hypokalemia: Secondary | ICD-10-CM | POA: Diagnosis not present

## 2014-12-25 DIAGNOSIS — I10 Essential (primary) hypertension: Secondary | ICD-10-CM

## 2014-12-25 DIAGNOSIS — E119 Type 2 diabetes mellitus without complications: Secondary | ICD-10-CM | POA: Diagnosis not present

## 2014-12-25 DIAGNOSIS — J441 Chronic obstructive pulmonary disease with (acute) exacerbation: Principal | ICD-10-CM

## 2014-12-25 DIAGNOSIS — K088 Other specified disorders of teeth and supporting structures: Secondary | ICD-10-CM

## 2014-12-25 DIAGNOSIS — F17210 Nicotine dependence, cigarettes, uncomplicated: Secondary | ICD-10-CM | POA: Diagnosis not present

## 2014-12-25 LAB — CBC
HEMATOCRIT: 34.8 % — AB (ref 36.0–46.0)
Hemoglobin: 11.6 g/dL — ABNORMAL LOW (ref 12.0–15.0)
MCH: 27.8 pg (ref 26.0–34.0)
MCHC: 33.3 g/dL (ref 30.0–36.0)
MCV: 83.5 fL (ref 78.0–100.0)
Platelets: 395 10*3/uL (ref 150–400)
RBC: 4.17 MIL/uL (ref 3.87–5.11)
RDW: 13 % (ref 11.5–15.5)
WBC: 8.6 10*3/uL (ref 4.0–10.5)

## 2014-12-25 LAB — BASIC METABOLIC PANEL
ANION GAP: 6 (ref 5–15)
BUN: 11 mg/dL (ref 6–23)
CALCIUM: 9.3 mg/dL (ref 8.4–10.5)
CO2: 30 mmol/L (ref 19–32)
Chloride: 99 mmol/L (ref 96–112)
Creatinine, Ser: 0.76 mg/dL (ref 0.50–1.10)
GFR calc Af Amer: >90 mL/min (ref >90)
GFR calc non Af Amer: 88 mL/min — ABNORMAL LOW (ref >90)
Glucose, Bld: 181 mg/dL — ABNORMAL HIGH (ref 70–99)
Potassium: 3.6 mmol/L (ref 3.5–5.1)
Sodium: 135 mmol/L (ref 135–145)

## 2014-12-25 LAB — GLUCOSE, CAPILLARY
GLUCOSE-CAPILLARY: 273 mg/dL — AB (ref 70–99)
Glucose-Capillary: 138 mg/dL — ABNORMAL HIGH (ref 70–99)
Glucose-Capillary: 191 mg/dL — ABNORMAL HIGH (ref 70–99)
Glucose-Capillary: 261 mg/dL — ABNORMAL HIGH (ref 70–99)

## 2014-12-25 LAB — INFLUENZA PANEL BY PCR (TYPE A & B)
H1N1FLUPCR: NOT DETECTED
INFLAPCR: NEGATIVE
INFLBPCR: NEGATIVE

## 2014-12-25 LAB — MAGNESIUM: Magnesium: 1.5 mg/dL (ref 1.5–2.5)

## 2014-12-25 MED ORDER — DM-GUAIFENESIN ER 30-600 MG PO TB12
1.0000 | ORAL_TABLET | Freq: Two times a day (BID) | ORAL | Status: DC
  Administered 2014-12-25 – 2014-12-26 (×3): 1 via ORAL
  Filled 2014-12-25 (×4): qty 1

## 2014-12-25 MED ORDER — KETOROLAC TROMETHAMINE 15 MG/ML IJ SOLN
15.0000 mg | Freq: Four times a day (QID) | INTRAMUSCULAR | Status: DC | PRN
  Administered 2014-12-25 – 2014-12-26 (×4): 15 mg via INTRAVENOUS
  Filled 2014-12-25 (×4): qty 1

## 2014-12-25 MED ORDER — LOSARTAN POTASSIUM 50 MG PO TABS
50.0000 mg | ORAL_TABLET | Freq: Every day | ORAL | Status: DC
  Administered 2014-12-25 – 2014-12-26 (×2): 50 mg via ORAL
  Filled 2014-12-25 (×2): qty 1

## 2014-12-25 MED ORDER — MAGNESIUM SULFATE 2 GM/50ML IV SOLN
2.0000 g | Freq: Once | INTRAVENOUS | Status: AC
  Administered 2014-12-25: 2 g via INTRAVENOUS
  Filled 2014-12-25: qty 50

## 2014-12-25 NOTE — Progress Notes (Addendum)
  Date: 12/25/2014  Patient name: [name]  Medical record number: [id number]  Date of birth: 1951/05/11   I have seen and evaluated [name] and discussed their care with the Residency Team. [name] was admitted from [facility] for one week of productive cough, subjective F and C and night sweats, increased alb use, and dyspnea. Also had gum and ? Lip swelling.  Able to speak in full sentences and maintained O2 sat on RA of 99-100%. HRRR no MRG. L good air flow with occ end exp wheeze. Ext no edema. +2 DP pulses.   Assessment and Plan: I have seen and evaluated the patient as outlined above. I agree with the formulated Assessment and Plan as detailed in the residents' admission note, with the following changes:   1. Acute Resp Failure 2/2 COPD exacerbation - [name] has a clinical d/o COPD although I cannot find PFT's in EPIC. She is a current smoker. She has had admissions for presume COPD exac in the past, most recently 09/2014 in which she required intubation for 2 days for hypercapnea. This has been her only intubation so far. During that admission, it was also felt that she had some upper airway dysfxn / VCD. The trigger for this exacerbation is not clear - she is compliant with her meds but has had sick exposures and still smokes. She has no pul edema. Therefore, will tx for typical COPD exac - ABX, steroids, and nebs. Encourage smoking cessation. She already has order for outpt PFT's. O2 PRN although 99 - 100% on RA even while talking.   2. R shoulder pain - she has a h/o L shoulder pain but has ben c/o R shoulder pain for about 3 months. She has trouble lying / getting comfortable at night and has trouble reaching for things with that arm and has had to ask for help with things such as bathing, toilet ing, and dressing which is understandably distressing for her. She has seen Sports Med and had a [name] which showed mild to mod arthritis changes, no gross rotator cuff abnl,  and no fluid. She was dx with adhesive capsulitis. She was given a steroid injection, home PT exercises, and F/U appt for another injection. Would ask PT to see her to show exercises and stress importance of daily exercises.   3. Possible angioedema 2/2 ACEI - It is not clear whether her sxs are ACEI angioedema or periodontal disease. She is diabetic and hypertensive so ACE / ARB would be very beneficial. The last microalb was obtained in Jan 2014 and was nl. Up To Date is now rec ARB in pts suspected of ACE angioedema in whom ACE / ARB would be beneficial. Her medicare should cover an ARB. Agree with the change to ARB.  4. Poor dentition - outpt mgmt.  Possible D/C 18th or 19th.  Bartholomew Crews, MD 2/17/20162:19 PM

## 2014-12-25 NOTE — Progress Notes (Signed)
Subjective: [name] reports some improvement in her breathing overnight, though she has continued to feel poorly. Endorses continued wheezing with associated chest tightness. She claims that this tightness feels as if she is "breathing just into one small place" within her lungs. Denies a sensation of chest pressure, nausea, and vomiting. Also endorses presence of headache.  She also reports continued swelling at her lower gum line, which she feels is secondary to her history of periodontal disease. Reports that this swelling has previously been associated with severe pain along the gum line and the ability to express pus from the gum. She denies swelling of her lips, tongue, and throat at the present time; however, does report two episodes of feeling that her throat was closing since being started on lisinopril. Most recent episode occurred one month ago, when she awoke with the sensation that her throat was swollen. Reports that she did eat pineapple shortly before both of these episodes.  She is also quite concerned about her longstanding right shoulder pain. Reports being unable to wipe or bathe herself appropriately due to the pain. She was seen by Sports Medicine for this issue on 11/22/14, and reports improvement of the pain with corticosteroid injection given that day.  Objective: Vital signs in last 24 hours: Filed Vitals:   12/24/14 2126 12/25/14 0259 12/25/14 0518 12/25/14 0844  BP: 154/76  161/88   Pulse: 86  109 95  Temp: 98.4 F (36.9 C)  97.8 F (36.6 C)   TempSrc: Oral  Oral   Resp: 18  17 18   Weight:   97.977 kg (216 lb)   SpO2: 92% 93% 95% 95%   Weight change:  No intake or output data in the 24 hours ending 12/25/14 1044  Physical Exam: Vitals: BP 161/88 mmHg  Pulse 95  Temp(Src) 97.8 F (36.6 C) (Oral)  Resp 18  Wt 97.977 kg (216 lb)  SpO2 95% General: Well-developed female sitting comfortably in bed. Cooperative during exam. Tearful when speaking of her  issues with functioning 2/2 shoulder pain. Neurologic: No focal deficits appreciated. CN II-XII grossly intact. Alert and oriented x3. Head: Normocephalic and atraumatic. Eyes: EOMI. Mouth: Lips and tongue without notable swelling. Pharynx pink and moist without erythema or exudates. Poor dentition with presence of multiple broken, stained teeth. Gums visibly swollen. Neck: Supple with full range of motion. Lungs: Diffuse end expiratory wheezes, worse at the bases. Diminished air flow throughout. No crackles present. Cardiovascular: RRR with no murmur, rub, or gallop.  Extremities: Warm and well-perfused without pretibial edema.  Lab Results: Basic Metabolic Panel:  Lab 13/24/40 1105 12/25/14 0526  NA 140 135  K 3.4* 3.6  CL 98 99  CO2 32 30  GLUCOSE 100* 181*  BUN <5* 11  CREATININE 0.77 0.76  CALCIUM 9.5 9.3  MG --  1.5   Liver Function Tests: Lab 12/24/14 1105  AST 13  ALT 8  ALKPHOS 80  BILITOT 0.6  PROT 7.4  ALBUMIN 3.7   CBC: Lab 12/24/14 1105 12/25/14 0526  WBC 7.9 8.6  NEUTROABS 4.3 --   HGB 11.8* 11.6*  HCT 35.3* 34.8*  MCV 84.2 83.5  PLT 370 395   Micro Results: Recent Results (from the past 240 hour(s))  MRSA PCR Screening     Status: None   Collection Time: 12/24/14  9:30 PM  Result Value Ref Range Status   MRSA by PCR NEGATIVE NEGATIVE Final    Comment:        The GeneXpert MRSA  Assay (FDA approved for NASAL specimens only), is one component of a comprehensive MRSA colonization surveillance program. It is not intended to diagnose MRSA infection nor to guide or monitor treatment for MRSA infections.    Studies/Results: Dg Orthopantogram  12/24/2014   CLINICAL DATA:  Swollen comes for 1 day  EXAM: ORTHOPANTOGRAM/PANORAMIC  COMPARISON:  None.  FINDINGS: Pain oriented view of the mandible shows no evidence of acute fracture. The patient is predominantly edentulous. Residual  deep demonstrate changes consistent with dental caries. No sclerotic or lytic lesions are seen.  IMPRESSION: No acute abnormality noted.  Predominantly edentulous with dental caries in the residual teeth.   Electronically Signed   By: Inez Catalina M.D.   On: 12/24/2014 19:08   X-ray Chest Pa And Lateral  12/24/2014   CLINICAL DATA:  Respiratory distress for 1 day  EXAM: CHEST  2 VIEW  COMPARISON:  09/14/2014  FINDINGS: Cardiac shadow is within normal limits. Postsurgical changes are again seen. The lungs are well aerated bilaterally without focal infiltrate or sizable effusion. No acute bony abnormality is seen.  IMPRESSION: No acute abnormality noted.   Electronically Signed   By: Inez Catalina M.D.   On: 12/24/2014 19:07   Medications: I have reviewed the patient's current medications. Scheduled Medications: . benzonatate  100 mg Oral BID  . budesonide-formoterol  2 puff Inhalation BID  . carvedilol  6.25 mg Oral BID WC  . citalopram  40 mg Oral Daily  . dextromethorphan-guaiFENesin  1 tablet Oral BID  . doxycycline  100 mg Oral Q12H  . fluticasone  1 spray Each Nare Daily  . heparin  5,000 Units Subcutaneous 3 times per day  . hydrochlorothiazide  25 mg Oral Daily  . insulin aspart  0-5 Units Subcutaneous QHS  . insulin aspart  0-9 Units Subcutaneous TID WC  . ipratropium-albuterol  3 mL Nebulization Q6H  . losartan  50 mg Oral Daily  . pantoprazole  40 mg Oral Daily  . potassium chloride  40 mEq Oral Once  . pravastatin  40 mg Oral Daily  . predniSONE  40 mg Oral Q breakfast  . sodium chloride  3 mL Intravenous Q12H   Continuous Infusions: none  PRN Meds: sodium chloride, acetaminophen, albuterol, benzocaine, ibuprofen, sodium chloride, sodium chloride  Assessment & Plan by Problem: 64 y.o. female with PMH of COPD, grade 1 diastolic dysfunction, HLD, HTN, DM II, and remote history of breast cancer.  #COPD Exacerbation: Patient reports continued shortness of breath with associated  coughing and wheezing; however, breathing appears to be improved today. Continues to be afebrile. No requirement for supplemental O2 at this time. 2 view CXR (2/16) with no acute abnormality.  - Start prednisone 40 mg PO daily - Continue doxycycline 100mg  q12h for 5 days (2/16-2/20) - Continue DuoNebs q4h - Continue albuterol nebulizers PRN - Continue home Symbicort BID - Continue Flonase and saline nasal spray - Continue Tessalon Perles for cough - Start Mucinex BID - Social work consult regarding smoking cessation.  #Poor Dentition: Patient with multiple dental carries on [facility] dental imaging and notable periodontal disease that is likely causing her gum swelling. - Referral to dentist as outpatient - Social work consult regarding resources for dental care  #Possible ACE Inhibitor Angioedema: Patient reports two episodes (most recent 1 month ago) of feeling that her throat and mouth were swelling. Of note, patient had eaten pineapple both times. Cannot rule out angioedema due to lisinopril. - Discontinue lisinopril - Start losartan 50 mg  daily  #Right Shoulder Pain: Patient was seen by Dr. Dorcas Mcmurray in Sports Medicine on 11/22/14. Shoulder [place] on that day showed mild to moderate arthritic changes, no fluid in the glenohumeral joint, and no gross deformity of the rotator cuff. Dr. Nori Riis felt that pain was atributable to adhesive capsulitis, and administered methylprednisolone injection to the right shoulder. Patient instructed to return for second injection in 4 weeks. Patient also instructed to perform wall crawl exercises multiple times each day, and was prescribed diclofenac 3 times a day. - Continue Tylenol 6 or 50 mg as tolerated - Switched from ibuprofen to Toradol 15 mg q6h PRN.  #DM2 (diabetes mellitus, type 2): Patient receiving steroids, blood glucose 100 (2/16) and 181 (2/17). Will monitor closely. - Continue to hold metformin - Continue sliding scale insulin  #Hypertension: ACE  inhibitor held since admission. BP elevated this morning to 161/88. - Start losartan 50 mg daily - Continue to hold ACE inhibitor - Continue Coreg 6.25 mg BID and HCTZ 25 mg daily  #Dyslipidemia: - Continue Pravastatin 40mg  daily  #Hypokalemia: K 3.4 on admission, in the setting of diuretic use. Resolved on 2/17 BMP. - Monitor BMP  #Depression: - Continue Celexa 40 mg daily  DVT PPx: [place] Heparin Code Status: Full  Dispo: Disposition is deferred at this time, awaiting improvement of COPD exacerbation. Anticipated discharge in approximately 1-3 day(s).  This is a Careers information officer Note.  The care of the patient was discussed with Dr. Trudee Kuster and the assessment and plan formulated with their assistance.  Please see their attached note for official documentation of the daily encounter.   LOS: 1 day   [name], Med Student 12/25/2014, 10:44 AM

## 2014-12-25 NOTE — Progress Notes (Signed)
Subjective:    Currently, the patient some improvement of her breathing, but she was up all night coughing. She reports some pain in her ribs as a result. She denies any swelling of her throat currently, but she did report some throat swelling after being started on lisinopril that occurred 2 times approximately a month ago. This is also associated with eating pineapple. Her gum swelling has been off and on for the last year, and she reports some pain with chewing and occasional drainage from her gums. She is also concerned about her left shoulder pain, which she was seen in clinic about approximately a month ago. She is wondering if anything was seen on her chest x-ray.  Interval Events: -Received DuoNeb's last night. -Vital signs stable satting in high 90s on room air.  -Multiple dental caries on Panorex.    Objective:    Vital Signs:   Temp:  [97.8 F (36.6 C)-98.4 F (36.9 C)] 97.8 F (36.6 C) (02/17 0518) Pulse Rate:  [82-109] 95 (02/17 0844) Resp:  [17-20] 18 (02/17 0844) BP: (137-161)/(66-88) 161/88 mmHg (02/17 0518) SpO2:  [92 %-98 %] 95 % (02/17 0844) Weight:  [216 lb (97.977 kg)] 216 lb (97.977 kg) (02/17 0518)    24-hour weight change: Weight change:   Intake/Output:  No intake or output data in the 24 hours ending 12/25/14 1028    Physical Exam: General: Vital signs reviewed and noted. Well-developed, well-nourished, in no acute distress; alert, appropriate and cooperative throughout examination.  Lungs:  End expiratory wheezing bilaterally. Good air movement.   Heart: RRR. S1 and S2 normal without gallop, murmur, or rubs.  Abdomen:  BS normoactive. Soft, Nondistended, non-tender.  No masses or organomegaly.  Extremities: No pretibial edema.     Labs:  Basic Metabolic Panel:  Recent Labs Lab 12/24/14 1105 12/25/14 0526  NA 140 135  K 3.4* 3.6  CL 98 99  CO2 32 30  GLUCOSE 100* 181*  BUN <5* 11  CREATININE 0.77 0.76  CALCIUM 9.5 9.3  MG  --  1.5      Liver Function Tests:  Recent Labs Lab 12/24/14 1105  AST 13  ALT 8  ALKPHOS 80  BILITOT 0.6  PROT 7.4  ALBUMIN 3.7   CBC:  Recent Labs Lab 12/24/14 1105 12/25/14 0526  WBC 7.9 8.6  NEUTROABS 4.3  --   HGB 11.8* 11.6*  HCT 35.3* 34.8*  MCV 84.2 83.5  PLT 370 395   CBG:  Recent Labs Lab 12/24/14 1034 12/24/14 1802 12/24/14 2211 12/25/14 0749  GLUCAP 93 247* 318* 138*    Microbiology: Results for orders placed or performed during the hospital encounter of 12/24/14  MRSA PCR Screening     Status: None   Collection Time: 12/24/14  9:30 PM  Result Value Ref Range Status   MRSA by PCR NEGATIVE NEGATIVE Final    Comment:        The GeneXpert MRSA Assay (FDA approved for NASAL specimens only), is one component of a comprehensive MRSA colonization surveillance program. It is not intended to diagnose MRSA infection nor to guide or monitor treatment for MRSA infections.     Other results: EKG: normal EKG, normal sinus rhythm, unchanged from previous tracings.  Imaging: Dg Orthopantogram  12/24/2014   CLINICAL DATA:  Swollen comes for 1 day  EXAM: ORTHOPANTOGRAM/PANORAMIC  COMPARISON:  None.  FINDINGS: Pain oriented view of the mandible shows no evidence of acute fracture. The patient is predominantly edentulous. Residual deep demonstrate  changes consistent with dental caries. No sclerotic or lytic lesions are seen.  IMPRESSION: No acute abnormality noted.  Predominantly edentulous with dental caries in the residual teeth.   Electronically Signed   By: Inez Catalina M.D.   On: 12/24/2014 19:08   X-ray Chest Pa And Lateral  12/24/2014   CLINICAL DATA:  Respiratory distress for 1 day  EXAM: CHEST  2 VIEW  COMPARISON:  09/14/2014  FINDINGS: Cardiac shadow is within normal limits. Postsurgical changes are again seen. The lungs are well aerated bilaterally without focal infiltrate or sizable effusion. No acute bony abnormality is seen.  IMPRESSION: No acute  abnormality noted.   Electronically Signed   By: Inez Catalina M.D.   On: 12/24/2014 19:07       Medications:    Infusions:    Scheduled Medications: . benzonatate  100 mg Oral BID  . budesonide-formoterol  2 puff Inhalation BID  . carvedilol  6.25 mg Oral BID WC  . citalopram  40 mg Oral Daily  . doxycycline  100 mg Oral Q12H  . fluticasone  1 spray Each Nare Daily  . heparin  5,000 Units Subcutaneous 3 times per day  . hydrochlorothiazide  25 mg Oral Daily  . insulin aspart  0-5 Units Subcutaneous QHS  . insulin aspart  0-9 Units Subcutaneous TID WC  . ipratropium-albuterol  3 mL Nebulization Q6H  . pantoprazole  40 mg Oral Daily  . potassium chloride  40 mEq Oral Once  . pravastatin  40 mg Oral Daily  . predniSONE  40 mg Oral Q breakfast  . sodium chloride  3 mL Intravenous Q12H    PRN Medications: sodium chloride, acetaminophen, albuterol, benzocaine, ibuprofen, sodium chloride, sodium chloride   Assessment/ Plan:    Principal Problem:   COPD exacerbation Active Problems:   DM2 (diabetes mellitus, type 2)   TOBACCO ABUSE   Depression   Hypertension, benign essential, goal below 140/90   Dyslipidemia associated with type 2 diabetes mellitus   Swollen gums   COPD with exacerbation  #COPD exacerbation Breathing appears to be improved today, but she continues to have some coughing and wheezing. She was intubated during her previous hospitalization, so we will keep her overnight to monitor her respiratory status. -Continue DuoNeb's every 4 hours. -Continue albuterol nebulizers as needed. -Continue doxycycline 100 mg every 12 hours for 5 days, day 2 today. -Switched to prednisone 40 mg by mouth today. -Continue home Symbicort twice a day. -Continue Flonase and saline nasal spray. -Continue Tessalon Perles for cough suppression. We'll start Mucinex twice a day. -Consulted Education officer, museum for help with smoking cessation.  #Possible ACE inhibitor angioedema 2 remote  episodes of throat swelling, which was associated with eating pineapple. These incidents occurred after she was started on lisinopril and are concerning for ACE inhibitor induced angioedema. Given the risk will discontinue this medication, and try her on an ARB. -Hold lisinopril. -Started on losartan 50 mg daily and will monitor for reaction.  #Dental caries Gum swelling most likely due to periodontal disease. She has multiple cavities on her Panorex, and she will need to see a dentist. -Refer to a dentist as an outpatient.  #Right shoulder adhesive capsulitis No abnormalities on chest x-ray. She was seen in clinic on 11/22/2014 and referred to sports medicine. Dr. Nori Riis saw her that day and performed a shoulder ultrasound, which noted mild to moderate arthritic changes and no fluid along with no gross deformity to the rotator cuff. She felt her shoulder  pain was most likely due to adhesive capsulitis. She received a methylprednisolone injection at that time and was given diclofenac 3 times a day for her pain along with instructions to follow-up in 4 weeks. -Continue Tylenol 6 or 50 mg as needed. -Switched from ibuprofen to Toradol 15 mg every 6 hours as needed.  #Hypertension Blood pressure elevated this morning after holding home ACE inhibitor. -Start losartan 50 mg daily as above. -Continue Coreg 6.25 mg twice a day and HCTZ 25 mg daily.  #Type 2 diabetes Blood sugars stable. We'll monitor closely given steroids. -Continue to hold home metformin. -Continue sliding scale insulin sensitive.  #Hypokalemia Resolved. Likely due to diuretics. Magnesium normal. -Monitor BMP.  #Depression -Continue home Celexa 40 mg daily.   DVT PPX - heparin  CODE STATUS - Full.  CONSULTS PLACED - None.  DISPO - Disposition is deferred at this time, awaiting improvement of COPD exacerbation.   Anticipated discharge in approximately 1-2 day(s).   The patient does have a current PCP Gordy Levan,  Malachi Bonds, MD) and does need an [facility] hospital follow-up appointment after discharge.    Is the [facility] hospital follow-up appointment a one-time only appointment? no.  Does the patient have transportation limitations that hinder transportation to clinic appointments? yes   SERVICE NEEDED AT [place]         Y = Yes, Blank = No PT:   OT:   RN:   Equipment:   Other:      Length of Stay: 1 day(s)   Signed: Arman Filter, MD  PGY-1, Internal Medicine Resident Pager: [phone] (7AM-5PM) 12/25/2014, 10:28 AM

## 2014-12-26 DIAGNOSIS — E785 Hyperlipidemia, unspecified: Secondary | ICD-10-CM | POA: Diagnosis not present

## 2014-12-26 DIAGNOSIS — E119 Type 2 diabetes mellitus without complications: Secondary | ICD-10-CM | POA: Diagnosis not present

## 2014-12-26 DIAGNOSIS — I10 Essential (primary) hypertension: Secondary | ICD-10-CM | POA: Diagnosis not present

## 2014-12-26 DIAGNOSIS — F329 Major depressive disorder, single episode, unspecified: Secondary | ICD-10-CM | POA: Diagnosis not present

## 2014-12-26 DIAGNOSIS — J441 Chronic obstructive pulmonary disease with (acute) exacerbation: Secondary | ICD-10-CM | POA: Diagnosis not present

## 2014-12-26 DIAGNOSIS — F17210 Nicotine dependence, cigarettes, uncomplicated: Secondary | ICD-10-CM | POA: Diagnosis not present

## 2014-12-26 LAB — MAGNESIUM: MAGNESIUM: 2 mg/dL (ref 1.5–2.5)

## 2014-12-26 LAB — GLUCOSE, CAPILLARY
GLUCOSE-CAPILLARY: 94 mg/dL (ref 70–99)
GLUCOSE-CAPILLARY: 196 mg/dL — AB (ref 70–99)

## 2014-12-26 MED ORDER — DICLOFENAC SODIUM 75 MG PO TBEC: 75.0000 mg | DELAYED_RELEASE_TABLET | Freq: Three times a day (TID) | ORAL | Status: DC

## 2014-12-26 MED ORDER — DOXYCYCLINE HYCLATE 100 MG PO TABS: 100.0000 mg | ORAL_TABLET | Freq: Two times a day (BID) | ORAL | Status: AC

## 2014-12-26 MED ORDER — LOSARTAN POTASSIUM 50 MG PO TABS: 50.0000 mg | ORAL_TABLET | Freq: Every day | ORAL | Status: DC

## 2014-12-26 MED ORDER — BENZONATATE 100 MG PO CAPS: 100.0000 mg | ORAL_CAPSULE | Freq: Two times a day (BID) | ORAL | Status: DC

## 2014-12-26 MED ORDER — DM-GUAIFENESIN ER 30-600 MG PO TB12: 1.0000 | ORAL_TABLET | Freq: Two times a day (BID) | ORAL | Status: DC

## 2014-12-26 MED ORDER — CITALOPRAM HYDROBROMIDE 20 MG PO TABS
30.0000 mg | ORAL_TABLET | Freq: Every day | ORAL | Status: DC
  Administered 2014-12-26: 30 mg via ORAL
  Filled 2014-12-26: qty 2

## 2014-12-26 MED ORDER — SALINE SPRAY 0.65 % NA SOLN
1.0000 | Freq: Four times a day (QID) | NASAL | Status: DC
  Administered 2014-12-26: 1 via NASAL
  Filled 2014-12-26: qty 44

## 2014-12-26 MED ORDER — HYDROCHLOROTHIAZIDE 25 MG PO TABS: 25.0000 mg | ORAL_TABLET | Freq: Every day | ORAL | Status: DC

## 2014-12-26 MED ORDER — PREDNISONE 20 MG PO TABS: 40.0000 mg | ORAL_TABLET | Freq: Every day | ORAL | Status: AC

## 2014-12-26 MED ORDER — CITALOPRAM HYDROBROMIDE 20 MG PO TABS: 30.0000 mg | ORAL_TABLET | Freq: Every day | ORAL | Status: DC

## 2014-12-26 NOTE — Evaluation (Signed)
Physical Therapy Evaluation/ Discharge Patient Details Name: [name] MRN: [id number] DOB: 19-May-1951 Today's Date: 12/26/2014   History of Present Illness   48F with hx COPD, tobacco abuse, DM2, and hypertension presenting with COPD exacerbation and recent diagnosis of Right shoulder adhesive capsulitis  Clinical Impression  [name] reports she has been having trouble with Right shoulder for awhile. She was seen at sports medicine but was not given a diagnosis and did not see therapy. Pt with limited strength and ROM consistent with adhesive capsulitis with education for [facility] for right shoulder as well as adaptive strategies and tools to function with self care including use of tongs and a reacher. Recommend OPPT but pt states she may or may not follow through since husband drives her but that she will initiate activities and ROM discussed. No further acute needs at this time as pt voices understanding and agreement for additional therapy in outpatient setting. Will sign off.     Follow Up Recommendations Outpatient PT    Equipment Recommendations  None recommended by PT    Recommendations for Other Services       Precautions / Restrictions Precautions Precautions: None      Mobility  Bed Mobility Overal bed mobility: Needs Assistance Bed Mobility: Supine to Sit     Supine to sit: Supervision     General bed mobility comments: cues for sequence to decrease reliance and pain of Right shoulder  Transfers Overall transfer level: Modified independent                  Ambulation/Gait Ambulation/Gait assistance: Modified independent (Device/Increase time) Ambulation Distance (Feet): 20 Feet Assistive device: None       General Gait Details: pt denied ambulating as she just walked around the unit with nursing with reportedly no difficulty or SOB  Stairs            Wheelchair Mobility    Modified Rankin (Stroke Patients Only)        Balance                                             Pertinent Vitals/Pain Pain Assessment: 0-10 Pain Score: 4  Pain Location: right shoulder Pain Descriptors / Indicators: Aching Pain Intervention(s): Repositioned    Home Living Family/patient expects to be discharged to:: Private residence Living Arrangements: Spouse/significant other Available Help at Discharge: Family;Available 24 hours/day Type of Home: House Home Access: Stairs to enter Entrance Stairs-Rails: None Entrance Stairs-Number of Steps: 1 Home Layout: One level Home Equipment: None      Prior Function Level of Independence: Independent         Comments: reports she was independent with all activities but husband drives, having difficulty with brushing her hair and toileting due to right shoulder pain     Hand Dominance        Extremity/Trunk Assessment   Upper Extremity Assessment: RUE deficits/detail;LUE deficits/detail RUE Deficits / Details: PROM and AROM limited to grossly 90 degrees shoulder flexion, 70 degrees abduction, internal and external rotation lacking grossly 10 degrees     LUE Deficits / Details: WFL but ROM limited by body habitus   Lower Extremity Assessment: Overall WFL for tasks assessed      Cervical / Trunk Assessment: Normal  Communication   Communication: No difficulties  Cognition Arousal/Alertness: Awake/alert Behavior During Therapy: WFL for tasks  assessed/performed Overall Cognitive Status: Within Functional Limits for tasks assessed                      General Comments      Exercises General Exercises - Upper Extremity Shoulder Flexion: AAROM;5 reps;Standing;Right Shoulder ABduction: AAROM;Standing;5 reps;Right Other Exercises Other Exercises: AROM shoulder internal and external rotation x 5 standing      Assessment/Plan    PT Assessment All further PT needs can be met in the next venue of care  PT Diagnosis Other  (comment) (decreased RUE ROM and function with impaired ability for self care)   PT Problem List Decreased range of motion;Decreased strength;Pain  PT Treatment Interventions     PT Goals (Current goals can be found in the Care Plan section) Acute Rehab PT Goals PT Goal Formulation: All assessment and education complete, DC therapy    Frequency     Barriers to discharge        Co-evaluation               End of Session   Activity Tolerance: Patient limited by pain Patient left: in chair;with call bell/phone within reach Nurse Communication: Mobility status    Functional Assessment Tool Used: clinical judgement Functional Limitation: Self care Self Care Current Status (H7290): At least 1 percent but less than 20 percent impaired, limited or restricted Self Care Goal Status (S1115): At least 1 percent but less than 20 percent impaired, limited or restricted Self Care Discharge Status [phone]): At least 1 percent but less than 20 percent impaired, limited or restricted    Time: 1152-1213 PT Time Calculation (min) (ACUTE ONLY): 21 min   Charges:   PT Evaluation $Initial PT Evaluation Tier I: 1 Procedure     PT G Codes:   PT G-Codes **NOT FOR INPATIENT CLASS** Functional Assessment Tool Used: clinical judgement Functional Limitation: Self care Self Care Current Status (E2336): At least 1 percent but less than 20 percent impaired, limited or restricted Self Care Goal Status (P2244): At least 1 percent but less than 20 percent impaired, limited or restricted Self Care Discharge Status [phone]): At least 1 percent but less than 20 percent impaired, limited or restricted    Melford Aase 12/26/2014, 12:58 PM  Elwyn Reach, [place]

## 2014-12-26 NOTE — Progress Notes (Signed)
[name] to be D/C'd Home per MD order.  Discussed with the patient and all questions fully answered.  Skin clean, dry and intact without evidence of skin break down, no evidence of skin tears noted. IV catheter discontinued intact. Site without signs and symptoms of complications. Dressing and pressure applied.  An After Visit Summary was printed and given to the patient. Prescriptions called in to patient's pharmacy.  D/c education completed with patient/family including follow up instructions, medication list, d/c activities limitations if indicated, with other d/c instructions as indicated by MD - patient able to verbalize understanding, all questions fully answered.   Patient instructed to return to ED, call 911, or call MD for any changes in condition.   Patient escorted via [name], and D/C home via private auto.  [name] 12/26/2014 1:56 PM

## 2014-12-26 NOTE — Progress Notes (Signed)
Subjective: The patient reports overall improvement in her breathing this morning; however, does report poor sleep due to incessant coughing. Reports that coughing and wheezing have improved since admission, though they are still present. Denies any current swelling of her lips, tongue, or throat.  Objective: Vital Signs: Filed Vitals:   12/25/14 2140 12/25/14 2146 12/26/14 0249 12/26/14 0637  BP:  155/75  158/83  Pulse: 92 82 77 73  Temp:  97.1 F (36.2 C)  98.9 F (37.2 C)  TempSrc:  Axillary  Oral  Resp: 18 18 18 16   Height:      Weight:    99.6 kg (219 lb 9.3 oz)  SpO2:  100% 100% 97%   Weight change: 1.623 kg (3 lb 9.3 oz)  Intake/Output Summary (Last 24 hours) at 12/26/14 0700 Last data filed at 12/25/14 1342  Gross per 24 hour  Intake    360 ml  Output      0 ml  Net    360 ml   Physical Exam: General: Patient well-developed and well-nourished, lying comfortably in bed in no acute distress. Cooperate throughout interview. Alert and oriented x 3. Lungs: Movement of air improved from yesterday's exam, though patient continues to cough with deep breathing. Mild end expiratory wheezes bilaterally. Heart: RRR without murmur, rub, or gallop. Abdomen: Soft, nondistended and nontender. Extremities: Warm and well-perfused, DP pulses 2+ bilaterally. No pretibial edema.  Lab Results: CBG (last 3):  12/25/14 1140 12/25/14 1708 12/25/14 2143  GLUCAP 191* 273* 261*   Magnesium: 2.0 (12/26/14, 9892)  Micro Results: Recent Results (from the past 240 hour(s))  MRSA PCR Screening     Status: None   Collection Time: 12/24/14  9:30 PM  Result Value Ref Range Status   MRSA by PCR NEGATIVE NEGATIVE Final    Comment:        The GeneXpert MRSA Assay (FDA approved for NASAL specimens only), is one component of a comprehensive MRSA colonization surveillance program. It is not intended to diagnose MRSA infection nor to guide or monitor treatment for MRSA infections.     Imaging: Dg Orthopantogram  12/24/2014   CLINICAL DATA:  Swollen comes for 1 day  EXAM: ORTHOPANTOGRAM/PANORAMIC  COMPARISON:  None.  FINDINGS: Pain oriented view of the mandible shows no evidence of acute fracture. The patient is predominantly edentulous. Residual deep demonstrate changes consistent with dental caries. No sclerotic or lytic lesions are seen.  IMPRESSION: No acute abnormality noted.  Predominantly edentulous with dental caries in the residual teeth.   Electronically Signed   By: Inez Catalina M.D.   On: 12/24/2014 19:08   X-ray Chest Pa And Lateral  12/24/2014   CLINICAL DATA:  Respiratory distress for 1 day  EXAM: CHEST  2 VIEW  COMPARISON:  09/14/2014  FINDINGS: Cardiac shadow is within normal limits. Postsurgical changes are again seen. The lungs are well aerated bilaterally without focal infiltrate or sizable effusion. No acute bony abnormality is seen.  IMPRESSION: No acute abnormality noted.   Electronically Signed   By: Inez Catalina M.D.   On: 12/24/2014 19:07   Medications: I have reviewed the patient's current medications. Continuous Infusions: None Scheduled Medications: . benzonatate  100 mg Oral BID  . budesonide-formoterol  2 puff Inhalation BID  . carvedilol  6.25 mg Oral BID WC  . citalopram  40 mg Oral Daily  . dextromethorphan-guaiFENesin  1 tablet Oral BID  . doxycycline  100 mg Oral Q12H  . fluticasone  1 spray Each Nare  Daily  . heparin  5,000 Units Subcutaneous 3 times per day  . hydrochlorothiazide  25 mg Oral Daily  . insulin aspart  0-5 Units Subcutaneous QHS  . insulin aspart  0-9 Units Subcutaneous TID WC  . ipratropium-albuterol  3 mL Nebulization Q6H  . losartan  50 mg Oral Daily  . pantoprazole  40 mg Oral Daily  . potassium chloride  40 mEq Oral Once  . pravastatin  40 mg Oral Daily  . predniSONE  40 mg Oral Q breakfast  . sodium chloride  3 mL Intravenous Q12H  PRN Medications:.sodium chloride, acetaminophen, albuterol, benzocaine,  ketorolac, sodium chloride, sodium chloride  Assessment/Plan: 64 y.o. female with PMH of COPD, grade 1 diastolic dysfunction, HLD, HTN, DM II, and remote history of breast cancer, admitted for management of COPD exacerbation.  #COPD Exacerbation: Patient reports improvement of breathing overnight. Oxygen saturations 95-100 ORA. Physical exam with improvement of diffuse end-expiratory wheezes and improved air movement throughout.  - PFTs on outpatient basis - Continue prednisone 40 mg PO daily for 5 days total (2/16-2/20), now day 3 - Continue doxycycline 100mg  q12h for 5 days total (2/16-2/20), now day 3 - Continue Tessalon Perles for cough - Continue Mucinex BID - Continue Flonase and saline nasal spray - Continue home Symbicort BID - Continue Proventil, Ventolin HFA PRN - Continue DuoNeb PRN - Continue Spiriva daily  #Poor Dentition: Patient with multiple dental carries on Panorex dental imaging and notable periodontal disease that is likely causing her gum swelling. - Referral to dentist as outpatient - Social work consult regarding resources for dental care  #Possible ACE Inhibitor Angioedema: Lisinopril discontinued 2/2 concern for angioedema. Losartan started. - Continue losartan 50 mg daily  #Right Shoulder Pain: Patient was seen by Dr. Dorcas Mcmurray in sports medicine on 11/22/14. Patient reports that she has not been doing her exercises at home 2/2 limiting shoulder pain. Did not fill prescription for prescribed diclofenac. - PT consult prior to discharge - Follow-up with sports medicine  - Continue Tylenol 6 or 50 mg as tolerated  #Diabetes mellitus, type 2: Patient receiving steroids, blood glucose elevated (138, 191, 273, 261) over the past 24 hours. A1c (2/16) 6.9. - Restart metformin 1000 mg BID at discharge - Discontinue sliding scale insulin at discharge  #Hypertension: BP elevated at admission and throughout the day yesterday. Recent change from ACE inhibitor to  losartan. - Follow-up with PCP for outpatient management - Continue losartan 50 mg daily - Continue to hold ACE inhibitor - Continue Coreg 6.25 mg BID and HCTZ 25 mg daily  #Dyslipidemia: - Continue Pravastatin 40mg  daily  #Hypokalemia: Resolved. Magnesium normal (2/18).  #Depression: QTc 472, will decrease Celexa from 40 mg to 30 mg daily. Patient reports a recent break in her medication due to issues filling the prescription at the pharmacy. Apparently went over 10 days without her medication, just restarted 5 days ago. Reports that her mood was poor while off of the medication. Patient counseled to continue relationship with PCP to control her depression.  DVT PPx: [place] Heparin Code Status: Full  Dispo: Discharge to home today.  This is a Careers information officer Note.  The care of the patient was discussed with Dr. Karle Starch Moding and the assessment and plan formulated with their assistance.  Please see their attached note for official documentation of the daily encounter.   LOS: 2 days   Cynda Acres, Med Student 12/26/2014, 7:00 AM

## 2014-12-26 NOTE — Progress Notes (Signed)
  Date: 12/26/2014  Patient name: [name]  Medical record number: [id number]  Date of birth: 12-Jul-1951   This patient has been seen and the plan of care was discussed with the house staff. Please see their note for complete details. I concur with their findings with the following additions/corrections: She remains on RA and stay at 98% or higher even when talking. No accessory resp muscle use. Able to speak in full sentences. LCTAB with better air flow than yesterday. Stable for D/C home after PT session for R adhesive capsulitis. F/U [facility] one week.  Bartholomew Crews, MD 12/26/2014, 11:01 AM

## 2014-12-26 NOTE — Progress Notes (Signed)
Subjective:    She reports improvement of her breathing this morning, but says that she's continued to cough up a lot of mucus and reports having a headache this morning. She says that the Toradol injections have been helping her pain, but she does not think her shoulder feels much better. She denies any throat or gum swelling this morning, and she reports some mild nasal congestion.  Interval Events: -Receiving Toradol when necessary for pain. -Started on losartan 50 mg daily yesterday. -Blood pressure slightly improved, but still elevated at 158/83.    Objective:    Vital Signs:   Temp:  [97.1 F (36.2 C)-98.9 F (37.2 C)] 98.9 F (37.2 C) (02/18 0637) Pulse Rate:  [73-95] 74 (02/18 0751) Resp:  [16-20] 17 (02/18 0751) BP: (155-177)/(75-83) 158/83 mmHg (02/18 0637) SpO2:  [94 %-100 %] 96 % (02/18 0751) Weight:  [219 lb 9.3 oz (99.6 kg)] 219 lb 9.3 oz (99.6 kg) (02/18 0637)    24-hour weight change: Weight change: 3 lb 9.3 oz (1.623 kg)  Intake/Output:   Intake/Output Summary (Last 24 hours) at 12/26/14 0759 Last data filed at 12/25/14 1342  Gross per 24 hour  Intake    360 ml  Output      0 ml  Net    360 ml      Physical Exam: General: Vital signs reviewed and noted. Well-developed, well-nourished, in no acute distress; alert, appropriate and cooperative throughout examination.  Lungs:  Interval improvement with minimal wheezing.  Heart: RRR. S1 and S2 normal without gallop, murmur, or rubs.  Abdomen:  BS normoactive. Soft, Nondistended, non-tender.  No masses or organomegaly.  Extremities: No pretibial edema. Decreased range of motion of right shoulder with associated pain.      Labs:  Basic Metabolic Panel:  Recent Labs Lab 12/24/14 1105 12/25/14 0526 12/26/14 0634  NA 140 135  --   K 3.4* 3.6  --   CL 98 99  --   CO2 32 30  --   GLUCOSE 100* 181*  --   BUN <5* 11  --   CREATININE 0.77 0.76  --   CALCIUM 9.5 9.3  --   MG  --  1.5 2.0    Liver  Function Tests:  Recent Labs Lab 12/24/14 1105  AST 13  ALT 8  ALKPHOS 80  BILITOT 0.6  PROT 7.4  ALBUMIN 3.7   CBC:  Recent Labs Lab 12/24/14 1105 12/25/14 0526  WBC 7.9 8.6  NEUTROABS 4.3  --   HGB 11.8* 11.6*  HCT 35.3* 34.8*  MCV 84.2 83.5  PLT 370 395   CBG:  Recent Labs Lab 12/24/14 2211 12/25/14 0749 12/25/14 1140 12/25/14 1708 12/25/14 2143  GLUCAP 318* 138* 191* 59* 261*    Microbiology: Results for orders placed or performed during the hospital encounter of 12/24/14  MRSA PCR Screening     Status: None   Collection Time: 12/24/14  9:30 PM  Result Value Ref Range Status   MRSA by PCR NEGATIVE NEGATIVE Final    Comment:        The GeneXpert MRSA Assay (FDA approved for NASAL specimens only), is one component of a comprehensive MRSA colonization surveillance program. It is not intended to diagnose MRSA infection nor to guide or monitor treatment for MRSA infections.     Imaging: Dg Orthopantogram  12/24/2014   CLINICAL DATA:  Swollen comes for 1 day  EXAM: ORTHOPANTOGRAM/PANORAMIC  COMPARISON:  None.  FINDINGS: Pain oriented view of  the mandible shows no evidence of acute fracture. The patient is predominantly edentulous. Residual deep demonstrate changes consistent with dental caries. No sclerotic or lytic lesions are seen.  IMPRESSION: No acute abnormality noted.  Predominantly edentulous with dental caries in the residual teeth.   Electronically Signed   By: Inez Catalina M.D.   On: 12/24/2014 19:08   X-ray Chest Pa And Lateral  12/24/2014   CLINICAL DATA:  Respiratory distress for 1 day  EXAM: CHEST  2 VIEW  COMPARISON:  09/14/2014  FINDINGS: Cardiac shadow is within normal limits. Postsurgical changes are again seen. The lungs are well aerated bilaterally without focal infiltrate or sizable effusion. No acute bony abnormality is seen.  IMPRESSION: No acute abnormality noted.   Electronically Signed   By: Inez Catalina M.D.   On: 12/24/2014 19:07        Medications:    Infusions:    Scheduled Medications: . benzonatate  100 mg Oral BID  . budesonide-formoterol  2 puff Inhalation BID  . carvedilol  6.25 mg Oral BID WC  . citalopram  40 mg Oral Daily  . dextromethorphan-guaiFENesin  1 tablet Oral BID  . doxycycline  100 mg Oral Q12H  . fluticasone  1 spray Each Nare Daily  . heparin  5,000 Units Subcutaneous 3 times per day  . hydrochlorothiazide  25 mg Oral Daily  . insulin aspart  0-5 Units Subcutaneous QHS  . insulin aspart  0-9 Units Subcutaneous TID WC  . ipratropium-albuterol  3 mL Nebulization Q6H  . losartan  50 mg Oral Daily  . pantoprazole  40 mg Oral Daily  . potassium chloride  40 mEq Oral Once  . pravastatin  40 mg Oral Daily  . predniSONE  40 mg Oral Q breakfast  . sodium chloride  3 mL Intravenous Q12H    PRN Medications: sodium chloride, acetaminophen, albuterol, benzocaine, ketorolac, sodium chloride, sodium chloride   Assessment/ Plan:    Principal Problem:   COPD exacerbation Active Problems:   DM2 (diabetes mellitus, type 2)   TOBACCO ABUSE   Depression   Hypertension, benign essential, goal below 140/90   Dyslipidemia associated with type 2 diabetes mellitus   Swollen gums   COPD with exacerbation  #COPD exacerbation Breathing much improved today with residual cough. This will likely take some time to improve. She's speaking in full sentences without desaturation this morning. -Continue DuoNeb's every 4 hours. -Continue albuterol nebulizers as needed. -Continue doxycycline 100 mg every 12 hours for 5 days, day 3 today. -Continue prednisone 40 mg by mouth today, day 3 of 5. -Continue home Symbicort twice a day. -Schedule Flonase and saline nasal spray. -Continue Tessalon Perles and Mucinex DM. -Schedule PFTs as an outpatient. -Ambulate with saturations this morning. -Likely discharge home this afternoon.  #Right shoulder adhesive capsulitis She continues to have pain in her  shoulder with slight relief from Toradol. She was prescribed diclofenac 3 times a day by Dr. Milta Deiters sports medicine, however she never picked this up and she has not been performing her physical therapy exercises. -Consult physical therapy to work with patient today and provide her with exercises for her shoulder. -Continue Toradol 15 mg every 6 hours as needed. -We'll discharge home with prescription for diclofenac. -Encouraged patient to perform rehabilitation exercises. -Follow-up with sports medicine as an outpatient. -Continue Tylenol 650 mg as needed.  #Possible ACE inhibitor angioedema No further symptoms after switching to losartan. -Continue losartan 50 mg daily.  #Dental caries -Refer to a dentist  as an outpatient.  #Hypertension Blood pressure improved but remains slightly elevated in the setting of steroid use. -Continue losartan 50 mg daily as above. -Continue Coreg 6.25 mg twice a day and HCTZ 25 mg daily. -Follow-up as outpatient for titration of blood pressure medications.  #Type 2 diabetes Blood sugars elevated after starting on steroids. -Continue to hold home metformin. -Continue sliding scale insulin sensitive.  #Hypokalemia Resolved. Magnesium supplemented yesterday and now up to 2. -Monitor BMP.  #Depression Per pharmacy, maximum recommended dose in adults greater than 15 years old is 20 mg due to concern of PT prolongation. Her QTC was 472 on her last EKG, so will begin tapering her back to 20 mg daily. -Reduce Celexa to 30 mg daily. -Taper further as an outpatient, likely reduce this further after a month.   DVT PPX - heparin  CODE STATUS - Full.  CONSULTS PLACED - None.  DISPO - Likely discharge home this afternoon.  The patient does have a current PCP Gordy Levan, Malachi Bonds, MD) and does need an [facility] hospital follow-up appointment after discharge.    Is the [facility] hospital follow-up appointment a one-time only appointment? no.  Does the patient have  transportation limitations that hinder transportation to clinic appointments? yes   SERVICE NEEDED AT [place]         Y = Yes, Blank = No PT:   OT:   RN:   Equipment:   Other:      Length of Stay: 2 day(s)   Signed: Arman Filter, MD  PGY-1, Internal Medicine Resident Pager: [phone] (7AM-5PM) 12/26/2014, 7:59 AM

## 2014-12-26 NOTE — Discharge Summary (Signed)
Name: [name] MRN: [id number] DOB: Oct 26, 1951 64 y.o. PCP: Jones Bales, MD  Date of Admission: 12/24/2014  4:17 PM Date of Discharge: 12/26/2014 Attending Physician: Bartholomew Crews, MD  Discharge Diagnosis: Principal Problem:   COPD exacerbation Active Problems:   DM2 (diabetes mellitus, type 2)   TOBACCO ABUSE   Depression   Hypertension, benign essential, goal below 140/90   Dyslipidemia associated with type 2 diabetes mellitus   Swollen gums   COPD with exacerbation  Discharge Medications:   Medication List    STOP taking these medications        lisinopril-hydrochlorothiazide 20-25 MG per tablet  Commonly known as:  PRINZIDE,ZESTORETIC      TAKE these medications        ACCU-CHEK FASTCLIX LANCETS Misc  1 each by Other route See admin instructions. Check blood sugar daily as needed for high blood sugar.     ACCU-CHEK NANO SMARTVIEW W/DEVICE Kit  1 each by Other route See admin instructions. Check blood sugar daily as needed for high blood sugar.     albuterol (2.5 MG/3ML) 0.083% nebulizer solution  Commonly known as:  PROVENTIL  Take 3 mLs (2.5 mg total) by nebulization every 6 (six) hours as needed for wheezing or shortness of breath.     albuterol 108 (90 BASE) MCG/ACT inhaler  Commonly known as:  VENTOLIN HFA  Inhale 2 puffs into the lungs every 6 (six) hours as needed for wheezing or shortness of breath.     benzonatate 100 MG capsule  Commonly known as:  TESSALON  Take 1 capsule (100 mg total) by mouth 2 (two) times daily.     budesonide-formoterol 80-4.5 MCG/ACT inhaler  Commonly known as:  SYMBICORT  Inhale 2 puffs into the lungs 2 (two) times daily as needed. For asthma     carvedilol 6.25 MG tablet  Commonly known as:  COREG  Take 1 tablet (6.25 mg total) by mouth 2 (two) times daily with a meal.     citalopram 20 MG tablet  Commonly known as:  CELEXA  Take 1.5 tablets (30 mg total) by mouth daily.     cyclobenzaprine 5  MG tablet  Commonly known as:  FLEXERIL  Take 1 tablet (5 mg total) by mouth at bedtime as needed for muscle spasms.     dextromethorphan-guaiFENesin 30-600 MG per 12 hr tablet  Commonly known as:  MUCINEX DM  Take 1 tablet by mouth 2 (two) times daily.     diclofenac 75 MG EC tablet  Commonly known as:  VOLTAREN  Take 1 tablet (75 mg total) by mouth 3 (three) times daily.     doxycycline 100 MG tablet  Commonly known as:  VIBRA-TABS  Take 1 tablet (100 mg total) by mouth every 12 (twelve) hours.     fluticasone 50 MCG/ACT nasal spray  Commonly known as:  FLONASE  Place 1 spray into both nostrils daily as needed for allergies.     glucose blood test strip  Commonly known as:  ACCU-CHEK SMARTVIEW  Use to check blood sugar 1 to 2 times daily. diag code E 11.9. Non- insulin dependent     hydrochlorothiazide 25 MG tablet  Commonly known as:  HYDRODIURIL  Take 1 tablet (25 mg total) by mouth daily.     ipratropium-albuterol 0.5-2.5 (3) MG/3ML Soln  Commonly known as:  DUONEB  Take 3 mLs by nebulization every 4 (four) hours as needed.     losartan 50 MG tablet  Commonly known as:  COZAAR  Take 1 tablet (50 mg total) by mouth daily.     metFORMIN 1000 MG tablet  Commonly known as:  GLUCOPHAGE  Take 1 tablet (1,000 mg total) by mouth 2 (two) times daily with a meal.     pantoprazole 40 MG tablet  Commonly known as:  PROTONIX  Take 1 tablet (40 mg total) by mouth daily.     pravastatin 40 MG tablet  Commonly known as:  PRAVACHOL  TAKE 1 TABLET BY MOUTH DAILY     predniSONE 20 MG tablet  Commonly known as:  DELTASONE  Take 2 tablets (40 mg total) by mouth daily with breakfast.     sodium chloride 0.65 % Soln nasal spray  Commonly known as:  OCEAN  Place 1 spray into both nostrils as needed for congestion.        Disposition and follow-up:   [name] was discharged from [facility] in Stable condition.  At the hospital follow up visit  please address:  1.  Improvement in shortness of breath, follow up with Sports Medicine for adhesive capsulitis, tolerance of ARB, blood pressure control, referral to dentist for cavities, continue to taper Celexa.  2.  Labs / imaging needed at time of follow-up: CBG.  3.  Pending labs/ test needing follow-up: None.  Follow-up Appointments:     Follow-up Information    Follow up with Clinton Gallant, MD. Go on 01/02/2015.   Specialty:  Internal Medicine   Why:  8:15 AM appointment. Follow-up regarding admission for COPD exacerbation.   Contact information:   [place] [address] [phone]       Follow up with Dorcas Mcmurray, MD. Go on 01/03/2015.   Specialties:  Family Medicine, Sports Medicine   Why:  9:00 AM appointment. Follow-up on shoulder pain.   Contact information:   [address] [place] [address] [phone]       Discharge Instructions: Thank you for allowing [place] to be involved in your healthcare while you were hospitalized at [facility].  Please note that there have been changes to your home medications. --> PLEASE LOOK AT YOUR DISCHARGE MEDICATION LIST FOR DETAILS. Please call your PCP if you have any questions or concerns, or any difficulty getting any of your medications.  Please return to the ER if you have worsening of your symptoms or new severe symptoms arise.  We would like you to continue prednisone and doxycycline for 3 more days to help your COPD exacerbation improve.  We prescribed you diclofenac to help with your shoulder pain and inflammation. Make sure to follow up with Dr. Nori Riis so she can give you another steroid injection if needed and further address you adhesive capsulitis.  We have switched you from lisinopril to losartan for your blood pressure because of the swelling that you had in your throat. Let us know if you have similar symptoms in the future.  We would like you to decrease your Celexa dose to a safer  level for your heart. Take 30 mg (1.5 tablets) per day for now. Your PCP may want to reduce this dose further in the future. Discharge Instructions    Call MD for:  difficulty breathing, headache or visual disturbances    Complete by:  As directed      Call MD for:  extreme fatigue    Complete by:  As directed      Call MD for:  hives    Complete by:  As directed      Call MD for:  persistant dizziness or light-headedness    Complete by:  As directed      Call MD for:  persistant nausea and vomiting    Complete by:  As directed      Call MD for:  severe uncontrolled pain    Complete by:  As directed      Call MD for:  temperature >100.4    Complete by:  As directed      Diet - low sodium heart healthy    Complete by:  As directed      Increase activity slowly    Complete by:  As directed            Consultations: None.  Procedures Performed:  Dg Orthopantogram  12/24/2014   CLINICAL DATA:  Swollen comes for 1 day  EXAM: ORTHOPANTOGRAM/PANORAMIC  COMPARISON:  None.  FINDINGS: Pain oriented view of the mandible shows no evidence of acute fracture. The patient is predominantly edentulous. Residual deep demonstrate changes consistent with dental caries. No sclerotic or lytic lesions are seen.  IMPRESSION: No acute abnormality noted.  Predominantly edentulous with dental caries in the residual teeth.   Electronically Signed   By: Inez Catalina M.D.   On: 12/24/2014 19:08   X-ray Chest Pa And Lateral  12/24/2014   CLINICAL DATA:  Respiratory distress for 1 day  EXAM: CHEST  2 VIEW  COMPARISON:  09/14/2014  FINDINGS: Cardiac shadow is within normal limits. Postsurgical changes are again seen. The lungs are well aerated bilaterally without focal infiltrate or sizable effusion. No acute bony abnormality is seen.  IMPRESSION: No acute abnormality noted.   Electronically Signed   By: Inez Catalina M.D.   On: 12/24/2014 19:07   Admission HPI:  64yo F w/ PMH HLD, HTN, DM2, h/o breast ca, grade 1  diastolic dysfunction, and COPD presents from clinic with respiratory distress.  She was seen in clinic today c/o SOB, dyspnea, and productive cough with green/yellow sputum with subjective fevers and night sweats that began 7 days ago. She has multiple sick contacts in her husband, daughter, and granddaughter who all have colds, per pt. She has been using her rescue inhaler 4x daily and nebulizer q4h at home 2/2 SOB and dyspnea without much improvement in her symptoms. She also endorses decreased appetite, but denies any chest pain, headaches, or peripheral edema.   She also endorses swelling at her lower gum line x4 days with lip swelling x2 days. She is taking an ACEi. She denies eating or drinking anything unusual or changes in medications. She states that she does have poor dentition and has multiple broken teeth. She does not see a dentist.   165m IV Solumedrol was given in the clinic prior to her arrival to a floor bed.  Hospital Course by problem list: Principal Problem:   COPD exacerbation Active Problems:   DM2 (diabetes mellitus, type 2)   TOBACCO ABUSE   Depression   Hypertension, benign essential, goal below 140/90   Dyslipidemia associated with type 2 diabetes mellitus   Swollen gums   COPD with exacerbation   #COPD exacerbation She reported increasing shortness of breath and a cough productive of green sputum over the last week prior to presentation. She is noted to have diffuse wheezing on exam consistent with a COPD exacerbation. Her shortness of breath and cough improved with DuoNeb's and albuterol nebulizers. She received 2 days  of steroids and doxycycline during the admission, and she was discharged on prednisone 40 mg daily and doxycycline 100 mg twice a day for an additional 3 days ending on 12/29/2014. She was told to resume her home Symbicort twice a day upon discharge.  She was given Best boy and Mucinex DM during the hospitalization and at discharge to help with  her cough, and she was told to continue on her home Flonase and saline nasal spray to help with her postnasal drip. She does not have any PFTs in Epic, and she should be scheduled as an outpatient.  #Possible ACE inhibitor angioedema She reported a couple of incidents of throat swelling over the last month since starting lisinopril for her blood pressure control.  Denied any shortness of breath, but given concern for possible angioedema her lisinopril was stopped and added to her allergy list, and she was switched to losartan 50 mg daily. At follow-up, please assess for any symptoms of angioedema.  #Right shoulder adhesive capsulitis She complained of pain and decreased mobility of her right shoulder over the past couple of months that has limited her ability to care for herself.  She was quite distressed about her inability to dress herself or use the toilet without assistance.  She had been seen in clinic on 11/22/14 and was referred to Dr. Nori Riis of Sports Medicine who performed a R shoulder ultrasound and diagnosed her with adhesive capsulitis.  She underwent a steroid injection and was given a prescription for diclofenac that she didn't pick up along with PT exercises that she had not been doing.  She was counseled regarding the diagnosis during the admission, and follow up with Dr. Nori Riis was arranged at discharge.  PT was consulted to review exercises to improve her mobility.  The prescription for diclofenac was resent, and the patient was encouraged to pick it up and use at discharge.  #Dental caries She reported some gum swelling and pain prior to presentation that has been occuring for the past year along with occasional drainage from her gums.  Panorex was obtained that showed multiple dental caries in her remaining teeth. She would benefit from referral to a dentist as an outpatient.  #Hypertension Her blood pressure was slightly elevated after stopping her home lisinopril.  This improved after  starting losartan and continuing her home Coreg and HCTZ, but was slightly elevated prior to discharge at 155/69.  Assess her blood pressure at follow up and determine if she would benefit from titrating up her blood pressure medications.  #Type 2 diabetes Her hemoglobin A1c was 6.9 on presentation.  She was maintained on sliding scale insulin-sensitive during the hospitalization and restarted on her home metformin at discharge.  #Depression She reports being on Celexa 40 mg for a long time.  Per her report, she ran out of the medication for approximately two weeks prior to restarting it 5 days prior to presentation.  She noted worsening of her mood during this time period, but she is not sure how much Celexa has been helping her lately. Per FDA recommendations, the 20 mg is the maximum dose of Celexa for adults >3 years old due to risk of QT prolongation.  Her QTc was 472 on presentation, so she was tapered back to Celexa 30 mg daily.  This should be continued for approximately a month before tapering further.  At follow up, consider whether she may benefit from switching to another antidepressant to improve her depression.  Discharge Vitals:   BP 155/69  mmHg  Pulse 79  Temp(Src) 98 F (36.7 C) (Oral)  Resp 17  Ht '5\' 7"'  (1.702 m)  Wt 219 lb 9.3 oz (99.6 kg)  BMI 34.38 kg/m2  SpO2 99%  Discharge Labs:  Results for orders placed or performed during the hospital encounter of 12/24/14 (from the past 24 hour(s))  Glucose, capillary     Status: Abnormal   Collection Time: 12/25/14 11:40 AM  Result Value Ref Range   Glucose-Capillary 191 (H) 70 - 99 mg/dL  Glucose, capillary     Status: Abnormal   Collection Time: 12/25/14  5:08 PM  Result Value Ref Range   Glucose-Capillary 273 (H) 70 - 99 mg/dL  Glucose, capillary     Status: Abnormal   Collection Time: 12/25/14  9:43 PM  Result Value Ref Range   Glucose-Capillary 261 (H) 70 - 99 mg/dL   Comment 1 Notify RN   Magnesium     Status: None    Collection Time: 12/26/14  6:34 AM  Result Value Ref Range   Magnesium 2.0 1.5 - 2.5 mg/dL  Glucose, capillary     Status: None   Collection Time: 12/26/14  8:05 AM  Result Value Ref Range   Glucose-Capillary 94 70 - 99 mg/dL    Signed: Arman Filter, MD 12/26/2014, 11:25 AM    Services Ordered on Discharge: None. Equipment Ordered on Discharge: None.

## 2014-12-26 NOTE — Discharge Instructions (Signed)
Thank you for allowing [place] to be involved in your healthcare while you were hospitalized at [facility].   Please note that there have been changes to your home medications.  --> PLEASE LOOK AT YOUR DISCHARGE MEDICATION LIST FOR DETAILS.   Please call your PCP if you have any questions or concerns, or any difficulty getting any of your medications.  Please return to the ER if you have worsening of your symptoms or new severe symptoms arise.  We would like you to continue prednisone and doxycycline for 3 more days to help your COPD exacerbation improve.  We prescribed you diclofenac to help with your shoulder pain and inflammation. Make sure to follow up with Dr. Nori Riis so she can give you another steroid injection if needed and further address you adhesive capsulitis.  We have switched you from lisinopril to losartan for your blood pressure because of the swelling that you had in your throat.  Let us know if you have similar symptoms in the future.  We would like you to decrease your Celexa dose to a safer level for your heart.  Take 30 mg (1.5 tablets) per day for now.  Your PCP may want to reduce this dose further in the future.   Adhesive Capsulitis Sometimes the shoulder becomes stiff and is painful to move. Some people say it feels as if the shoulder is frozen in place. Because of this, the condition is called "frozen shoulder." Its medical name is adhesive capsulitis.  The shoulder joint is made up of strong connective tissue that attaches the ball of the humerus to the shallow shoulder socket. This strong connective tissue is called the joint capsule. This tissue can become stiff and swollen. That is when adhesive capsulitis sets in. CAUSES  It is not always clear just what the cause adhesive capsulitis. Possibilities include:  Injury to the shoulder joint.  Strain. This is a repetitive injury brought about by overuse.  Lack of use. Perhaps your arm or hand was  otherwise injured. It might have been in a sling for awhile. Or perhaps you were not using it to avoid pain.  Referred pain. This is a sort of trick the body plays. You feel pain in the shoulder. But, the pain actually comes from an injury somewhere else in the body.  Long-standing health problems. Several diseases can cause adhesive capsulitis. They include diabetes, heart disease, stroke, thyroid problems, rheumatoid arthritis and lung disease.  Being a women older than 61. Anyone can develop adhesive capsulitis but it is most common in women in this age group. SYMPTOMS   Pain.  It occurs when the arm is moved.  Parts of the shoulder might hurt if they are touched.  Pain is worse at night or when resting.  Soreness. It might not be strong enough to be called pain. But, the shoulder aches.  The shoulder does not move freely.  Muscle spasms.  Trouble sleeping because of shoulder ache or pain. DIAGNOSIS  To decide if you have adhesive capsulitis, your healthcare provider will probably:  Ask about symptoms you have noticed.  Ask about your history of joint pain and anything that might have caused the pain.  Ask about your overall health.  Use hands to feel your shoulder and neck.  Ask you to move your shoulder in specific directions. This may indicate the origin of the pain.  Order imaging tests; pictures of the shoulder. They help pinpoint the source of the problem. An X-ray  might be used. For more detail, an MRI is often used. An MRI details the tendons, muscles and ligaments as well as the joint. TREATMENT  Adhesive capsulitis can be treated several ways. Most treatments can be done in a clinic or in your healthcare provider's office. Be sure to discuss the different options with your caregiver. They include:  Physical therapy. You will work on specific exercises to get your shoulder moving again. The exercises usually involve stretching. A physical therapist (a caregiver  with special training) can show you what to do and what not to do. The exercises will need to be done daily.  Medication.  Over-the-counter medicines may relieve pain and inflammation (the body's way of reacting to injury or infection).  Corticosteroids. These are stronger drugs to reduce pain and inflammation. They are given by injection (shots) into the shoulder joint. Frequent treatment is not recommended.  Muscle relaxants. Medication may be prescribed to ease muscle spasms.  Treatment of underlying conditions. This means treating another condition that is causing your shoulder problem. This might be a rotator cuff (tendon) problem  Shoulder manipulation. The shoulder will be moved by your healthcare provider. You would be under general anesthesia (given a drug that puts you to sleep). You would not feel anything. Sometimes the joint will be injected with salt water (saline) at high pressure to break down internal scarring in the joint capsule.  Surgery. This is rarely needed. It may be suggested in advanced cases after all other treatment has failed. PROGNOSIS  In time, most people recover from adhesive capsulitis. Sometimes, however, the pain goes away but full movement of the shoulder does not return.  HOME CARE INSTRUCTIONS   Take any pain medications recommended by your healthcare provider. Follow the directions carefully.  If you have physical therapy, follow through with the therapist's suggestions. Be sure you understand the exercises you will be doing. You should understand:  How often the exercises should be done.  How many times each exercise should be repeated.  How long they should be done.  What other activities you should do, or not do.  That you should warm up before doing any exercise. Just 5 to 10 minutes will help. Small, gentle movements should get your shoulder ready for more.  Avoid high-demand exercise that involves your shoulder such as throwing. This type  of exercise can make pain worse.  Consider using cold packs. Cold may ease swelling and pain. Ask your healthcare provider if a cold pack might help you. If so, get directions on how and when to use them. SEEK MEDICAL CARE IF:   You have any questions about your medications.  Your pain continues to increase. Document Released: 08/22/2009 Document Revised: 01/17/2012 Document Reviewed: 08/22/2009 [facility] Patient Information 2015 [place], [place]. This information is not intended to replace advice given to you by your health care provider. Make sure you discuss any questions you have with your health care provider.

## 2014-12-30 ENCOUNTER — Ambulatory Visit (INDEPENDENT_AMBULATORY_CARE_PROVIDER_SITE_OTHER): Payer: Medicare Other | Admitting: Internal Medicine

## 2014-12-30 ENCOUNTER — Ambulatory Visit (HOSPITAL_COMMUNITY)
Admission: RE | Admit: 2014-12-30 | Discharge: 2014-12-30 | Disposition: A | Discharge status: Home / Self Care | Payer: Medicare Other | Source: Ambulatory Visit | Attending: Internal Medicine | Admitting: Internal Medicine

## 2014-12-30 ENCOUNTER — Telehealth: Payer: Self-pay | Admitting: *Deleted

## 2014-12-30 VITALS — BP 169/87 | HR 110 | Temp 99.0°F | Wt 217.4 lb

## 2014-12-30 DIAGNOSIS — E119 Type 2 diabetes mellitus without complications: Secondary | ICD-10-CM

## 2014-12-30 DIAGNOSIS — J441 Chronic obstructive pulmonary disease with (acute) exacerbation: Secondary | ICD-10-CM | POA: Diagnosis not present

## 2014-12-30 DIAGNOSIS — J111 Influenza due to unidentified influenza virus with other respiratory manifestations: Secondary | ICD-10-CM

## 2014-12-30 DIAGNOSIS — R0602 Shortness of breath: Secondary | ICD-10-CM

## 2014-12-30 DIAGNOSIS — C50919 Malignant neoplasm of unspecified site of unspecified female breast: Secondary | ICD-10-CM | POA: Diagnosis not present

## 2014-12-30 DIAGNOSIS — E118 Type 2 diabetes mellitus with unspecified complications: Secondary | ICD-10-CM

## 2014-12-30 DIAGNOSIS — I10 Essential (primary) hypertension: Secondary | ICD-10-CM | POA: Diagnosis not present

## 2014-12-30 DIAGNOSIS — Z87891 Personal history of nicotine dependence: Secondary | ICD-10-CM | POA: Diagnosis not present

## 2014-12-30 LAB — GLUCOSE, CAPILLARY: Glucose-Capillary: 163 mg/dL — ABNORMAL HIGH (ref 70–99)

## 2014-12-30 MED ORDER — AZITHROMYCIN 250 MG PO TABS: ORAL_TABLET | ORAL | Status: DC

## 2014-12-30 MED ORDER — OSELTAMIVIR PHOSPHATE 75 MG PO CAPS: 75.0000 mg | ORAL_CAPSULE | Freq: Two times a day (BID) | ORAL | Status: DC

## 2014-12-30 MED ORDER — PREDNISONE (PAK) 10 MG PO TABS: ORAL_TABLET | Freq: Every day | ORAL | Status: DC

## 2014-12-30 MED ORDER — ALBUTEROL SULFATE (2.5 MG/3ML) 0.083% IN NEBU
2.5000 mg | INHALATION_SOLUTION | Freq: Once | RESPIRATORY_TRACT | Status: AC
  Administered 2014-12-30: 2.5 mg via RESPIRATORY_TRACT

## 2014-12-30 MED ORDER — METHYLPREDNISOLONE SODIUM SUCC 125 MG IJ SOLR
125.0000 mg | Freq: Once | INTRAMUSCULAR | Status: AC
  Administered 2014-12-30: 125 mg via INTRAMUSCULAR

## 2014-12-30 MED ORDER — IPRATROPIUM BROMIDE 0.02 % IN SOLN
0.5000 mg | Freq: Once | RESPIRATORY_TRACT | Status: AC
  Administered 2014-12-30: 0.5 mg via RESPIRATORY_TRACT

## 2014-12-30 NOTE — Telephone Encounter (Signed)
Pt walked in to clinic - was discharged from hospital 12/26/14 - feels worse. Pt did not go to ER. Appt with Dr Hayes Ludwig today 10:45AM. Hilda Blades Kadiatou Oplinger RN 12/30/14 9:45AM

## 2014-12-30 NOTE — Patient Instructions (Addendum)
General Instructions: -Start taking Azithromycin for your sinus/cough. This is an antibiotic. Take two tablets today and one tablet per day for 4 days. Do not take celexa while you are taking this medication.  -Start taking mucinex DM to help with the cough.  -Take prednisone 10mg  4 tablets for 3 days, then 3 tablets for 3 days, then 2 tablets for 3 days, then 1 tablet for 3 days until you finish it.  -Start taking Tamiflu. This is for your flu-like symptoms. It is very likely that you have the flu so avoid being in contact with young children, people with asthma, or those who may become very sick with the flu.  -Follow up with [place] on Friday or Monday next week or depending on how your symptoms change. If your symptoms worsen return to clinic or go to the ED.    Please bring your medicines with you each time you come to clinic.  Medicines may include prescription medications, over-the-counter medications, herbal remedies, eye drops, vitamins, or other pills.   Progress Toward Treatment Goals:  Treatment Goal 08/08/2014  Hemoglobin A1C -  Blood pressure -  Stop smoking smoking the same amount    Self Care Goals & Plans:  Self Care Goal 12/24/2014  Manage my medications take my medicines as prescribed; bring my medications to every visit; refill my medications on time; follow the sick day instructions if I am sick  Monitor my health keep track of my weight; check my feet daily  Eat healthy foods eat more vegetables; eat fruit for snacks and desserts; eat foods that are low in salt; eat baked foods instead of fried foods; eat smaller portions; drink diet soda or water instead of juice or soda  Be physically active find an activity I enjoy  Stop smoking -    Home Blood Glucose Monitoring 01/03/2013  Check my blood sugar 3 times a day  When to check my blood sugar before dinner; before meals     Care Management & Community Referrals:  Referral 01/03/2013  Referrals made for care  management support none needed

## 2014-12-31 ENCOUNTER — Encounter: Payer: Self-pay | Admitting: Internal Medicine

## 2014-12-31 DIAGNOSIS — J111 Influenza due to unidentified influenza virus with other respiratory manifestations: Secondary | ICD-10-CM | POA: Insufficient documentation

## 2014-12-31 NOTE — Assessment & Plan Note (Signed)
>>  ASSESSMENT AND PLAN FOR CHRONIC OBSTRUCTIVE PULMONARY DISEASE WITH BRONCHOSPASM ([place]) WRITTEN ON 12/31/2014 11:08 PM BY Adele Barthel D, MD  She was treated with Doxycycline BID while in the hospital but did not pick up her prescription after she left the hospital and therefore did not finish her treatment with this Abx. Received steroid IV and PO in the hospital, was given a few tabs to be taken after her discharge but it is not clear if she filled that prescription.  -She received DuoNeb while in clinic and solumedrol 125mg  IV. Her wheezing and SOB greatly improved.  -She had CXR done which revealed no PNA, no acute cardiopulmonary disease.  -Pt was discussed with Dr. Marinda Elk, attending in the am.   -She was reassessed in the afternoon with great improvement of her symptoms.  Pt was discussed with Dr. Dareen Piano who also examined her.   -Rx Zpack  -Rx prednisone 15 day taper.  -Pt advised to pick up her Rx for mucinex DM -She will continue Nebulizer tx at home. -Pt also given empiric tx for influenza. -She was advised to return to clinic this week for reevaluation or to go to the ED if her symptoms worsened.

## 2014-12-31 NOTE — Assessment & Plan Note (Signed)
CBG of 163 in setting of current illness. No medication adjustments needed at this time.

## 2014-12-31 NOTE — Assessment & Plan Note (Signed)
Given her symptoms of body aches, chills, worsening SOB/URI symptoms, and sick contact (her husband), and given recent rise in influenza cases in [place], influenza infection is highly suspected.  She is within the 48 hr window of beginning of her flu-like illness and may benefit from Tamiflu tx.  -Rx Tamiflu 75mg  BID x5 days.  -Pt advised to drink water and stay hydrated.

## 2014-12-31 NOTE — Progress Notes (Signed)
   Subjective:    Patient ID: [name], female    DOB: [birth year], 64 y.o.   MRN: [id number]  HPI [name] is a 64 yr old woman with PMH of COPD, DM2, depression, HTN, presenting with complaint of body aches x2 days with chills, decreased intake per mouth, cough productive of scant white/golden sputum, and SOB despite nebulizer treatment at home. She was discharged from the hospital on 12/26/14 after receiving treatment for COPD exacerbation. She states that she got slightly better after her discharge but worsened with body aches starting two days prior to this visit. She reports that her husband also has had body aches.  She denies sinus pain/pressure, sorethroat, but has had rhinorrhea with clear discharge.    Review of Systems  Constitutional: Positive for chills, appetite change and fatigue. Negative for fever, diaphoresis and unexpected weight change.  HENT: Positive for congestion and rhinorrhea. Negative for ear pain, postnasal drip, sinus pressure and sore throat.   Respiratory: Positive for cough, shortness of breath and wheezing.   Cardiovascular: Negative for chest pain, palpitations and leg swelling.  Gastrointestinal: Negative for abdominal pain and diarrhea.  Genitourinary: Negative for dysuria and difficulty urinating.  Musculoskeletal: Positive for arthralgias.  Skin: Negative for rash.  Neurological: Negative for dizziness and light-headedness.  Psychiatric/Behavioral: Negative for confusion and agitation.       Objective:   Physical Exam  Constitutional: She is oriented to person, place, and time. She appears well-developed and well-nourished.  HENT:  Right Ear: External ear normal.  Left Ear: External ear normal.  Mouth/Throat: Oropharynx is clear and moist. No oropharyngeal exudate.  Boggy nasal turbinates bilaterally with clear discharge.   Eyes: Right eye exhibits no discharge. Left eye exhibits no discharge.  Injected conjunctiva bilaterally     Neck: Neck supple.  Cardiovascular: Regular rhythm.   Mild tachycardia  Pulmonary/Chest: Effort normal. No respiratory distress.  Initial diffuse wheezing in all lung fields  Abdominal: Soft. Bowel sounds are normal. There is no tenderness.  Musculoskeletal: She exhibits no edema or tenderness.  Lymphadenopathy:    She has no cervical adenopathy.  Neurological: She is alert and oriented to person, place, and time. Coordination normal.  Skin: Skin is warm and dry. She is not diaphoretic.  Psychiatric: She has a normal mood and affect.  Nursing note and vitals reviewed.         Assessment & Plan:

## 2014-12-31 NOTE — Assessment & Plan Note (Addendum)
She was treated with Doxycycline BID while in the hospital but did not pick up her prescription after she left the hospital and therefore did not finish her treatment with this Abx. Received steroid IV and PO in the hospital, was given a few tabs to be taken after her discharge but it is not clear if she filled that prescription.  -She received DuoNeb while in clinic and solumedrol 125mg  IV. Her wheezing and SOB greatly improved.  -She had CXR done which revealed no PNA, no acute cardiopulmonary disease.  -Pt was discussed with Dr. Marinda Elk, attending in the am.   -She was reassessed in the afternoon with great improvement of her symptoms.  Pt was discussed with Dr. Dareen Piano who also examined her.   -Rx Zpack  -Rx prednisone 15 day taper.  -Pt advised to pick up her Rx for mucinex DM -She will continue Nebulizer tx at home. -Pt also given empiric tx for influenza. -She was advised to return to clinic this week for reevaluation or to go to the ED if her symptoms worsened.

## 2015-01-01 ENCOUNTER — Telehealth: Payer: Self-pay | Admitting: Internal Medicine

## 2015-01-01 NOTE — Telephone Encounter (Signed)
Call to patient to confirm appointment for 01/02/15 at 8:15 mail box is full

## 2015-01-01 NOTE — Progress Notes (Signed)
INTERNAL MEDICINE TEACHING ATTENDING ADDENDUM - Aldine Contes, MD: I personally saw and evaluated [name] in this clinic visit in conjunction with the resident, Dr. Hayes Ludwig. I have discussed patient's plan of care with medical resident during this visit. I have confirmed the physical exam findings and have read and agree with the clinic note including the plan with the following addition: - Pt here with recurrent wheezing and SOB after being discharged for a COPD exacerbation - On exam - pt with bilateral scattered expiratory wheezes - She states her SOB has improved after nebs and steroids and appears in no acute distress currently - Pt sent home on prednisone taper, azithromycin and nebs - Pt also empirically started on Tamiflu given chills, myalgias with resp symptoms. We will f/u rapid flu test

## 2015-01-02 ENCOUNTER — Ambulatory Visit (INDEPENDENT_AMBULATORY_CARE_PROVIDER_SITE_OTHER): Payer: Medicare Other | Admitting: Internal Medicine

## 2015-01-02 ENCOUNTER — Encounter: Payer: Self-pay | Admitting: Internal Medicine

## 2015-01-02 VITALS — BP 160/140 | HR 97 | Temp 98.4°F | Wt 217.0 lb

## 2015-01-02 DIAGNOSIS — R062 Wheezing: Secondary | ICD-10-CM | POA: Diagnosis not present

## 2015-01-02 DIAGNOSIS — J1189 Influenza due to unidentified influenza virus with other manifestations: Secondary | ICD-10-CM | POA: Diagnosis not present

## 2015-01-02 DIAGNOSIS — I10 Essential (primary) hypertension: Secondary | ICD-10-CM

## 2015-01-02 DIAGNOSIS — E118 Type 2 diabetes mellitus with unspecified complications: Secondary | ICD-10-CM

## 2015-01-02 DIAGNOSIS — J111 Influenza due to unidentified influenza virus with other respiratory manifestations: Secondary | ICD-10-CM

## 2015-01-02 LAB — GLUCOSE, CAPILLARY: Glucose-Capillary: 198 mg/dL — ABNORMAL HIGH (ref 70–99)

## 2015-01-02 MED ORDER — ALBUTEROL SULFATE (2.5 MG/3ML) 0.083% IN NEBU
2.5000 mg | INHALATION_SOLUTION | Freq: Once | RESPIRATORY_TRACT | Status: AC
  Administered 2015-01-02: 2.5 mg via RESPIRATORY_TRACT

## 2015-01-02 MED ORDER — ALBUTEROL SULFATE (2.5 MG/3ML) 0.083% IN NEBU: 2.5000 mg | INHALATION_SOLUTION | RESPIRATORY_TRACT | Status: DC

## 2015-01-02 MED ORDER — IPRATROPIUM BROMIDE 0.02 % IN SOLN
0.5000 mg | Freq: Once | RESPIRATORY_TRACT | Status: AC
  Administered 2015-01-02: 0.5 mg via RESPIRATORY_TRACT

## 2015-01-02 MED ORDER — HYDROCOD POLST-CHLORPHEN POLST 10-8 MG/5ML PO LQCR: 5.0000 mL | Freq: Two times a day (BID) | ORAL | Status: DC | PRN

## 2015-01-02 MED ORDER — CYCLOBENZAPRINE HCL 5 MG PO TABS: 5.0000 mg | ORAL_TABLET | Freq: Every evening | ORAL | Status: DC | PRN

## 2015-01-02 NOTE — Patient Instructions (Signed)
Ms. Taylor - -   You were recently diagnosed with the flu, more information about the flu is below.    There are good medicines over the counter for symptoms of the flu.  For you, given that you have high blood pressure, you should take one called Coricidin maximum strength flu.  This medication will give you the most relief of your symptoms.   You will also be given a prescription strength cough medicine which can also help you sleep.  I would suggest only taking it at night until you see how it affects you (it may make you groggy).  However, you can take it TWICE a day.  If you cannot afford the cough syrup, the Coricidin should help and you can take that as directed.    IF YOU ARE TAKING THE COUGH SYRUP AND CORICIDIN AT THE SAME TIME, do not take more than four (4) Coricidin tablets a day and do not take the cough medicine more than two times per day.    A refill for your muscle relaxer was sent.  Please call Dr. Gordy Levan for further refills.   Thank you!

## 2015-01-02 NOTE — Assessment & Plan Note (Signed)
BP Readings from Last 3 Encounters:  01/02/15 160/140  12/30/14 169/87  12/26/14 170/79    Lab Results  Component Value Date   NA 135 12/25/2014   K 3.6 12/25/2014   CREATININE 0.76 12/25/2014    Assessment: Blood pressure control:   Progress toward BP goal:    Comments: will follow up after acute illness  Plan: Medications:  Continue Coreg 6.25 twice a day, HCTZ 25 mg daily, losartan 50 mg daily Educational resources provided:   Self management tools provided:   Other plans: f/u after acute illness

## 2015-01-02 NOTE — Assessment & Plan Note (Signed)
Was not able to fully address with acute issues and pt preference.

## 2015-01-02 NOTE — Progress Notes (Signed)
Subjective:   [name]   DOB: [birth year] 64 y.o.   MRN: [id number]  HPI: [name] is a 64 y.o. woman pmh as listed below presents for follow up on flu.   Pt states many of her symptoms have improved but she is still feeling fatigued and coughing. She has been able to breathe better and use her nebulizer solution less often. Her cough is still nonproductive in nature or productive of clear sputum. She denied any fever or chills. She has had return of her appetite. The pt states she wants admission into the hospital and that it is "my responsibility as her provider to take her illness away." extensive discussion into the nature of influenza and natural course of the disease was had with the patient.    Past Medical History  Diagnosis Date  . Hyperlipidemia   . Hypertension   . Tobacco abuse   . COPD (chronic obstructive pulmonary disease)     History of multiple hospital admissions for exercabation   . Asthma   . Breast cancer 1991    s/p lumpectomy, chemotherapy and radiation therapy in 1991. Mammogram in 2007 was normal.  . Sigmoid diverticulitis 80/2008  . Anxiety   . Depression   . Obesity   . GERD (gastroesophageal reflux disease)   . Heart murmur 10/05/11    "first time I ever heard I had one was today"  . Pneumonia   . Shortness of breath 10/05/11    "at rest; lying down; w/exertion"  . Diabetes mellitus   . Bronchitis     h/o  . Diarrhea     h/o  . Constipated     h/o  . COPD with exacerbation 04/06/2009    Qualifier: Diagnosis of  By: Eyvonne Mechanic MD, Vijay     Current Outpatient Prescriptions  Medication Sig Dispense Refill  . ACCU-CHEK FASTCLIX LANCETS MISC 1 each by Other route See admin instructions. Check blood sugar daily as needed for high blood sugar.    . albuterol (PROVENTIL) (2.5 MG/3ML) 0.083% nebulizer solution Take 3 mLs (2.5 mg total) by nebulization every 6 (six) hours as needed for wheezing or shortness of  breath. (Patient taking differently: Take 2.5 mg by nebulization every 4 (four) hours as needed for wheezing or shortness of breath. ) 1 vial 0  . albuterol (VENTOLIN HFA) 108 (90 BASE) MCG/ACT inhaler Inhale 2 puffs into the lungs every 6 (six) hours as needed for wheezing or shortness of breath. 1 Inhaler 3  . azithromycin (ZITHROMAX) 250 MG tablet Take two tablets once then one tablet daily for 4 days until you finish it. 6 each 0  . benzonatate (TESSALON) 100 MG capsule Take 1 capsule (100 mg total) by mouth 2 (two) times daily. 30 capsule 0  . Blood Glucose Monitoring Suppl (ACCU-CHEK NANO SMARTVIEW) W/DEVICE KIT 1 each by Other route See admin instructions. Check blood sugar daily as needed for high blood sugar.    . budesonide-formoterol (SYMBICORT) 80-4.5 MCG/ACT inhaler Inhale 2 puffs into the lungs 2 (two) times daily as needed. For asthma (Patient taking differently: Inhale 2 puffs into the lungs 2 (two) times daily. For asthma) 1 Inhaler 12  . carvedilol (COREG) 6.25 MG tablet Take 1 tablet (6.25 mg total) by mouth 2 (two) times daily with a meal. 60 tablet 0  . citalopram (CELEXA) 20 MG tablet Take 1.5 tablets (30 mg total) by mouth daily. 45 tablet 1  . cyclobenzaprine (FLEXERIL) 5 MG  tablet Take 1 tablet (5 mg total) by mouth at bedtime as needed for muscle spasms. 30 tablet 0  . dextromethorphan-guaiFENesin (MUCINEX DM) 30-600 MG per 12 hr tablet Take 1 tablet by mouth 2 (two) times daily. 30 tablet 0  . diclofenac (VOLTAREN) 75 MG EC tablet Take 1 tablet (75 mg total) by mouth 3 (three) times daily. 90 tablet 1  . fluticasone (FLONASE) 50 MCG/ACT nasal spray Place 1 spray into both nostrils daily as needed for allergies. (Patient taking differently: Place 1 spray into both nostrils daily. ) 16 g 2  . glucose blood (ACCU-CHEK SMARTVIEW) test strip Use to check blood sugar 1 to 2 times daily. diag code E 11.9. Non- insulin dependent 100 each 6  . hydrochlorothiazide (HYDRODIURIL) 25 MG  tablet Take 1 tablet (25 mg total) by mouth daily. 30 tablet 2  . ipratropium-albuterol (DUONEB) 0.5-2.5 (3) MG/3ML SOLN Take 3 mLs by nebulization every 4 (four) hours as needed. (Patient taking differently: Take 3 mLs by nebulization every 4 (four) hours as needed (shortness of breath). ) 360 mL 0  . losartan (COZAAR) 50 MG tablet Take 1 tablet (50 mg total) by mouth daily. 30 tablet 2  . metFORMIN (GLUCOPHAGE) 1000 MG tablet Take 1 tablet (1,000 mg total) by mouth 2 (two) times daily with a meal. 60 tablet 1  . oseltamivir (TAMIFLU) 75 MG capsule Take 1 capsule (75 mg total) by mouth 2 (two) times daily. 10 capsule 0  . pantoprazole (PROTONIX) 40 MG tablet Take 1 tablet (40 mg total) by mouth daily. 30 tablet 0  . pravastatin (PRAVACHOL) 40 MG tablet TAKE 1 TABLET BY MOUTH DAILY 90 tablet 1  . predniSONE (STERAPRED UNI-PAK) 10 MG tablet Take by mouth daily. Take 4 tablets for 3 days, then 3 tablets for 3 days, then 2 tablets for 3 days, then 1 tablet per day until you finish 30 tablet 0  . sodium chloride (OCEAN) 0.65 % SOLN nasal spray Place 1 spray into both nostrils as needed for congestion. (Patient taking differently: Place 1 spray into both nostrils daily as needed for congestion. ) 15 mL 3  . [DISCONTINUED] albuterol (PROVENTIL,VENTOLIN) 90 MCG/ACT inhaler Inhale 2 puffs into the lungs every 6 (six) hours as needed for wheezing. 17 g 12   No current facility-administered medications for this visit.   Family History  Problem Relation Age of Onset  . Cancer Mother    History   Social History  . Marital Status: Married    Spouse Name: N/A  . Number of Children: N/A  . Years of Education: N/A   Social History Main Topics  . Smoking status: Current Every Day Smoker -- 0.10 packs/day for 40 years    Types: Cigarettes    Last Attempt to Quit: 09/11/2014  . Smokeless tobacco: Never Used     Comment: slowly quitting.  . Alcohol Use: 2.4 oz/week    4 Cans of beer per week      Comment: "only on the weekends"  . Drug Use: No  . Sexual Activity: Not on file   Other Topics Concern  . None   Social History Narrative   Lives in [place] with her husband.   Takes care of 3 grand children.   Trying to find a job, has financial difficulties.         Review of Systems: Pertinent items are noted in HPI. Objective:  Physical Exam: Filed Vitals:   01/02/15 0816  BP: 160/140  Pulse: 97  Temp: 98.4 F (36.9 C)  TempSrc: Oral  Weight: 217 lb (98.431 kg)  SpO2: 95%   General: sitting in chair, NAD, talking in full sentences Cardiac: RRR, no rubs, murmurs or gallops Pulm: clear to auscultation bilaterally with minimal expiratory wheezes, no crackles or rhonchi, moving normal volumes of air Abd: soft, nontender, nondistended, BS present Ext: warm and well perfused, no pedal edema  Assessment & Plan:  Of note pt dismissed this provider from continuing the visit after pt was told that her physical exam was better and that she didn't warrant admission. The attending then completed the visit.   Please see problem oriented charting  Pt discussed with Dr. Daryll Drown

## 2015-01-02 NOTE — Assessment & Plan Note (Addendum)
Pt continues to have some cough and fatigue. Rest of plan per attending as visit with this provider was terminated by the patient.  -duoneb given in clinic -pt given Tussonex for cough -OTC tylenol and flu

## 2015-01-03 ENCOUNTER — Ambulatory Visit: Payer: Medicare Other | Admitting: Family Medicine

## 2015-01-03 ENCOUNTER — Other Ambulatory Visit: Payer: Self-pay | Admitting: *Deleted

## 2015-01-03 DIAGNOSIS — IMO0001 Reserved for inherently not codable concepts without codable children: Secondary | ICD-10-CM

## 2015-01-03 DIAGNOSIS — E1165 Type 2 diabetes mellitus with hyperglycemia: Principal | ICD-10-CM

## 2015-01-03 MED ORDER — METFORMIN HCL 1000 MG PO TABS: 1000.0000 mg | ORAL_TABLET | Freq: Two times a day (BID) | ORAL | Status: DC

## 2015-01-03 NOTE — Telephone Encounter (Signed)
Pt is out of meds, please refill 

## 2015-01-06 NOTE — Progress Notes (Signed)
Internal Medicine Clinic Attending  I saw and evaluated the patient.  I personally confirmed the key portions of the history and exam documented by Dr. Algis Liming and I reviewed pertinent patient test results.  The assessment, diagnosis, and plan were formulated together and I agree with the documentation in the resident's note.  I saw Ms. Welden at her request.  I believe there was a disconnect between patient and provider expectations as Ms. Weldon did not want to be told that her illness may take 2-4 weeks to fully recover.  I offered a listening ear and she expressed her discontent with the office visit.  We provided her with cough medicine that will hopefully help her sleep and also recommendation for an OTC cough medication for people with HTN or CAD.

## 2015-01-15 ENCOUNTER — Telehealth: Payer: Self-pay | Admitting: *Deleted

## 2015-01-15 NOTE — Telephone Encounter (Signed)
Pt called - would like to talk with Dr Gordy Levan about Celexa. Pt states different doctors keeps changing the dosage of med.  Hilda Blades Eathan Groman RN 01/15/15 2:15PM

## 2015-01-15 NOTE — Telephone Encounter (Signed)
[name] has an appointment on 3/21 and a discussion regarding celexa dosage and possible alternatives will be most valuable if discussed n person.  Please call [name] and encourage her to keep the appt on 3/21 and we can discuss then.  Thanks!

## 2015-01-17 ENCOUNTER — Telehealth: Payer: Self-pay | Admitting: *Deleted

## 2015-01-17 NOTE — Telephone Encounter (Signed)
Pt called and lm stating she didn't know what meds to take and wanted dr gill to call her and go over each of her meds and to refill her head nerve medicine. She states she is not going to take any more meds til dr gill calls her, i have called her 3 times with no answer and her mailbox is full, i scheduled her for mon at 1545 and will continue to call her

## 2015-01-20 ENCOUNTER — Encounter: Payer: Medicare Other | Admitting: Internal Medicine

## 2015-01-20 NOTE — Telephone Encounter (Signed)
Thanks.  If [name] has questions regarding her medications, it is appropriate to schedule her for an OV and request that she bring in her medication bottles so I can actually see what she is taking.

## 2015-01-22 ENCOUNTER — Encounter: Payer: Self-pay | Admitting: Internal Medicine

## 2015-01-22 ENCOUNTER — Ambulatory Visit (INDEPENDENT_AMBULATORY_CARE_PROVIDER_SITE_OTHER): Payer: Medicare Other | Admitting: Internal Medicine

## 2015-01-22 ENCOUNTER — Other Ambulatory Visit: Payer: Self-pay | Admitting: Internal Medicine

## 2015-01-22 VITALS — BP 151/69 | HR 96 | Temp 98.4°F

## 2015-01-22 DIAGNOSIS — Z0000 Encounter for general adult medical examination without abnormal findings: Secondary | ICD-10-CM | POA: Diagnosis not present

## 2015-01-22 DIAGNOSIS — F329 Major depressive disorder, single episode, unspecified: Secondary | ICD-10-CM | POA: Diagnosis not present

## 2015-01-22 DIAGNOSIS — J449 Chronic obstructive pulmonary disease, unspecified: Secondary | ICD-10-CM | POA: Diagnosis not present

## 2015-01-22 DIAGNOSIS — I10 Essential (primary) hypertension: Secondary | ICD-10-CM

## 2015-01-22 DIAGNOSIS — J441 Chronic obstructive pulmonary disease with (acute) exacerbation: Secondary | ICD-10-CM

## 2015-01-22 DIAGNOSIS — F32A Depression, unspecified: Secondary | ICD-10-CM

## 2015-01-22 MED ORDER — TIOTROPIUM BROMIDE MONOHYDRATE 18 MCG IN CAPS: 18.0000 ug | ORAL_CAPSULE | Freq: Every day | RESPIRATORY_TRACT | Status: DC

## 2015-01-22 MED ORDER — LISINOPRIL-HYDROCHLOROTHIAZIDE 20-25 MG PO TABS: 1.0000 | ORAL_TABLET | Freq: Every day | ORAL | Status: DC

## 2015-01-22 MED ORDER — LOSARTAN POTASSIUM-HCTZ 50-12.5 MG PO TABS: 1.0000 | ORAL_TABLET | Freq: Every day | ORAL | Status: DC

## 2015-01-22 NOTE — Assessment & Plan Note (Addendum)
Pt became tearful because her adult daughter and husband moved back in with her and she needs her privacy.  She stays in bed on most days.  No SI.  She was told to stop taking celexa in the hospital d/t QT prolongation?  She had been on this medication for a long time and was doing well.   -resume celexa 30mg  daily -referral to [facility] for [facility]

## 2015-01-22 NOTE — Assessment & Plan Note (Signed)
-  Ulis Rias will call [facility] and get records of last eye exam

## 2015-01-22 NOTE — Patient Instructions (Signed)
Thank you for your visit today.   Please return to the internal medicine clinic in 2-3 weeks or sooner if needed.     Your current medical regimen is effective;  continue present plan and take all medications as prescribed.    Please be sure to bring all of your medications with you to every visit; this includes herbal supplements, vitamins, eye drops, and any over-the-counter medications.   Should you have any questions regarding your medications and/or any new or worsening symptoms, please be sure to call the clinic at [phone].   If you believe that you are suffering from a life threatening condition or one that may result in the loss of limb or function, then you should call 911 or proceed to the nearest Emergency Department.     A healthy lifestyle and preventative care can promote health and wellness.   Maintain regular health, dental, and eye exams.  Eat a healthy diet. Foods like vegetables, fruits, whole grains, low-fat dairy products, and lean protein foods contain the nutrients you need without too many calories. Decrease your intake of foods high in solid fats, added sugars, and salt. Get information about a proper diet from your caregiver, if necessary.  Regular physical exercise is one of the most important things you can do for your health. Most adults should get at least 150 minutes of moderate-intensity exercise (any activity that increases your heart rate and causes you to sweat) each week. In addition, most adults need muscle-strengthening exercises on 2 or more days a week.   Maintain a healthy weight. The body mass index (BMI) is a screening tool to identify possible weight problems. It provides an estimate of body fat based on height and weight. Your caregiver can help determine your BMI, and can help you achieve or maintain a healthy weight. For adults 20 years and older:  A BMI below 18.5 is considered underweight.  A BMI of 18.5 to 24.9 is normal.  A BMI  of 25 to 29.9 is considered overweight.  A BMI of 30 and above is considered obese.

## 2015-01-22 NOTE — Assessment & Plan Note (Signed)
>>  ASSESSMENT AND PLAN FOR CHRONIC OBSTRUCTIVE PULMONARY DISEASE WITH BRONCHOSPASM ([place]) WRITTEN ON 01/22/2015 11:23 PM BY GILL, JACQUELYN S, MD  -restart spiriva (but reminded her not to take with ipratropium)

## 2015-01-22 NOTE — Assessment & Plan Note (Signed)
-  restart spiriva (but reminded her not to take with ipratropium)

## 2015-01-22 NOTE — Progress Notes (Signed)
Patient ID: [name], female   DOB: 04-Jun-1951, 64 y.o.   MRN: [id number]     Subjective:   Patient ID: JANAT TABBERT female    DOB: [birth year] 64 y.o.    MRN: [id number] Health Maintenance Due: Health Maintenance Due  Topic Date Due  . PAP SMEAR  12/30/1968  . COLONOSCOPY  12/30/2000  . OPHTHALMOLOGY EXAM  02/08/2014  . MAMMOGRAM  12/04/2014    _________________________________________________  HPI: Ms.Earla A Needham is a 64 y.o. female here for a routine visit to address medication questions.  Pt has a PMH outlined below.  Please see problem-based charting assessment and plan note for further details of medical issues addressed at today's visit.  PMH: Past Medical History  Diagnosis Date  . Hyperlipidemia   . Hypertension   . Tobacco abuse   . COPD (chronic obstructive pulmonary disease)     History of multiple hospital admissions for exercabation   . Asthma   . Breast cancer 1991    s/p lumpectomy, chemotherapy and radiation therapy in 1991. Mammogram in 2007 was normal.  . Sigmoid diverticulitis 80/2008  . Anxiety   . Depression   . Obesity   . GERD (gastroesophageal reflux disease)   . Heart murmur 10/05/11    "first time I ever heard I had one was today"  . Pneumonia   . Shortness of breath 10/05/11    "at rest; lying down; w/exertion"  . Diabetes mellitus   . Bronchitis     h/o  . Diarrhea     h/o  . Constipated     h/o  . COPD with exacerbation 04/06/2009    Qualifier: Diagnosis of  By: Eyvonne Mechanic MD, Vijay      Medications: Current Outpatient Prescriptions on File Prior to Visit  Medication Sig Dispense Refill  . ACCU-CHEK FASTCLIX LANCETS MISC 1 each by Other route See admin instructions. Check blood sugar daily as needed for high blood sugar.    . albuterol (PROVENTIL) (2.5 MG/3ML) 0.083% nebulizer solution Take 3 mLs (2.5 mg total) by nebulization every 6 (six) hours as needed for wheezing or shortness of breath. (Patient taking  differently: Take 2.5 mg by nebulization every 4 (four) hours as needed for wheezing or shortness of breath. ) 1 vial 0  . albuterol (VENTOLIN HFA) 108 (90 BASE) MCG/ACT inhaler Inhale 2 puffs into the lungs every 6 (six) hours as needed for wheezing or shortness of breath. 1 Inhaler 3  . Blood Glucose Monitoring Suppl (ACCU-CHEK NANO SMARTVIEW) W/DEVICE KIT 1 each by Other route See admin instructions. Check blood sugar daily as needed for high blood sugar.    . budesonide-formoterol (SYMBICORT) 80-4.5 MCG/ACT inhaler Inhale 2 puffs into the lungs 2 (two) times daily as needed. For asthma (Patient taking differently: Inhale 2 puffs into the lungs 2 (two) times daily. For asthma) 1 Inhaler 12  . carvedilol (COREG) 6.25 MG tablet Take 1 tablet (6.25 mg total) by mouth 2 (two) times daily with a meal. 60 tablet 0  . citalopram (CELEXA) 20 MG tablet Take 1.5 tablets (30 mg total) by mouth daily. 45 tablet 1  . cyclobenzaprine (FLEXERIL) 5 MG tablet Take 1 tablet (5 mg total) by mouth at bedtime as needed for muscle spasms. 30 tablet 0  . diclofenac (VOLTAREN) 75 MG EC tablet Take 1 tablet (75 mg total) by mouth 3 (three) times daily. 90 tablet 1  . fluticasone (FLONASE) 50 MCG/ACT nasal spray Place 1 spray into both nostrils daily  as needed for allergies. (Patient taking differently: Place 1 spray into both nostrils daily. ) 16 g 2  . glucose blood (ACCU-CHEK SMARTVIEW) test strip Use to check blood sugar 1 to 2 times daily. diag code E 11.9. Non- insulin dependent 100 each 6  . ipratropium-albuterol (DUONEB) 0.5-2.5 (3) MG/3ML SOLN Take 3 mLs by nebulization every 4 (four) hours as needed. (Patient taking differently: Take 3 mLs by nebulization every 4 (four) hours as needed (shortness of breath). ) 360 mL 0  . metFORMIN (GLUCOPHAGE) 1000 MG tablet Take 1 tablet (1,000 mg total) by mouth 2 (two) times daily with a meal. 60 tablet 11  . pantoprazole (PROTONIX) 40 MG tablet Take 1 tablet (40 mg total) by  mouth daily. 30 tablet 0  . pravastatin (PRAVACHOL) 40 MG tablet TAKE 1 TABLET BY MOUTH DAILY 90 tablet 1  . sodium chloride (OCEAN) 0.65 % SOLN nasal spray Place 1 spray into both nostrils as needed for congestion. (Patient taking differently: Place 1 spray into both nostrils daily as needed for congestion. ) 15 mL 3  . [DISCONTINUED] albuterol (PROVENTIL,VENTOLIN) 90 MCG/ACT inhaler Inhale 2 puffs into the lungs every 6 (six) hours as needed for wheezing. 17 g 12   No current facility-administered medications on file prior to visit.    Allergies: Allergies  Allergen Reactions  . Ace Inhibitors Swelling    Throat swelling.  [name] [Fluticasone] Other (See Comments)    Sinuses stop up and condition worsens    FH: Family History  Problem Relation Age of Onset  . Cancer Mother     SH: History   Social History  . Marital Status: Married    Spouse Name: N/A  . Number of Children: N/A  . Years of Education: N/A   Social History Main Topics  . Smoking status: Current Every Day Smoker -- 0.10 packs/day for 40 years    Types: Cigarettes    Last Attempt to Quit: 09/11/2014  . Smokeless tobacco: Never Used     Comment: slowly quitting.  . Alcohol Use: 2.4 oz/week    4 Cans of beer per week     Comment: "only on the weekends"  . Drug Use: No  . Sexual Activity: Not on file   Other Topics Concern  . None   Social History Narrative   Lives in [place] with her husband.   Takes care of 3 grand children.   Trying to find a job, has financial difficulties.          Review of Systems: Constitutional: Negative for fever, chills and weight loss.  Eyes: Negative for blurred vision.  Respiratory: Negative for cough and shortness of breath.  Cardiovascular: Negative for chest pain, palpitations and leg swelling.  Gastrointestinal: Negative for nausea, vomiting, abdominal pain, diarrhea, constipation and blood in stool.  Genitourinary: Negative for dysuria, urgency and  frequency.  Musculoskeletal: Negative for myalgias and back pain.  Neurological: Negative for dizziness, weakness and headaches.     Objective:   Vital Signs: Filed Vitals:   01/22/15 1104  BP: 151/69  Pulse: 96  Temp: 98.4 F (36.9 C)  TempSrc: Oral  SpO2: 100%     BP Readings from Last 3 Encounters:  01/22/15 151/69  01/02/15 160/140  12/30/14 169/87    Physical Exam: Constitutional: Vital signs reviewed.  Patient is in NAD and cooperative with exam.  Head: Normocephalic and atraumatic. Eyes: PERRL, EOMI, conjunctivae nl, no scleral icterus.  Neck: Supple. Cardiovascular: RRR, no MRG. Pulmonary/Chest:  normal effort, CTAB, no wheezes, rales, or rhonchi. Abdominal: Soft. NT/ND +BS. Neurological: A&O x3, cranial nerves II-XII are grossly intact, moving all extremities. Extremities: 2+DP b/l; no pitting edema. Skin: Warm, dry and intact. No rash.   Assessment & Plan:   Assessment and plan was discussed and formulated with my attending.

## 2015-01-22 NOTE — Assessment & Plan Note (Addendum)
BP today slightly elevated.  Due to possible throat swelling lisinopril was d/c during her last hospitalization.   -will switch to combo pill of hyzaar for better compliance -continue carvedilol

## 2015-01-22 NOTE — Assessment & Plan Note (Signed)
>>  ASSESSMENT AND PLAN FOR GAD (GENERALIZED ANXIETY DISORDER) WRITTEN ON 01/22/2015 11:29 PM BY GILL, JACQUELYN S, MD  Pt became tearful because her adult daughter and husband moved back in with her and she needs her privacy.  She stays in bed on most days.  No SI.  She was told to stop taking celexa in the hospital d/t QT prolongation?  She had been on this medication for a long time and was doing well.   -resume celexa 30mg  daily -referral to [facility] for counseling

## 2015-01-23 ENCOUNTER — Telehealth: Payer: Self-pay | Admitting: Licensed Clinical Social Worker

## 2015-01-23 NOTE — Progress Notes (Signed)
Case discussed with Dr. Gill soon after the resident saw the patient.  We reviewed the resident's history and exam and pertinent patient test results.  I agree with the assessment, diagnosis, and plan of care documented in the resident's note. 

## 2015-01-23 NOTE — Telephone Encounter (Signed)
Saw Dr Gordy Levan in clinic 01/22/15 AM.

## 2015-01-23 NOTE — Telephone Encounter (Signed)
[facility] confirmed by [facility] LCSW.  CSW will send information to Ms. Gieger.

## 2015-01-23 NOTE — Telephone Encounter (Signed)
Ms. Luevano was referred to CSW for counseling resources.  CSW discussed options through insurance as well as women's only counseling through [facility], which uses sliding fee scale.  Pt is not interested in sliding fee agency and is not ready to start counseling services at this time.  "It's not that serious.  I will call you when I'm ready.".  CSW discussed option of calling someone using a crisis/non-crisis line.  Pt agreeable and would like CSW to send information in the mail.  CSW confirming [facility] is still a viable program.  Will send information on [facility], after confirmation and [facility].

## 2015-01-27 ENCOUNTER — Other Ambulatory Visit: Payer: Self-pay | Admitting: *Deleted

## 2015-01-27 ENCOUNTER — Encounter: Payer: Medicare Other | Admitting: Internal Medicine

## 2015-01-27 MED ORDER — CARVEDILOL 6.25 MG PO TABS: 6.2500 mg | ORAL_TABLET | Freq: Two times a day (BID) | ORAL | Status: DC

## 2015-01-27 MED ORDER — CITALOPRAM HYDROBROMIDE 20 MG PO TABS: 30.0000 mg | ORAL_TABLET | Freq: Every day | ORAL | Status: DC

## 2015-02-20 ENCOUNTER — Telehealth: Payer: Self-pay | Admitting: Internal Medicine

## 2015-02-20 NOTE — Telephone Encounter (Signed)
Call to patient to confirm appointment for 02/24/15 at 1:45 lmtcb

## 2015-02-24 ENCOUNTER — Encounter: Payer: Self-pay | Admitting: Internal Medicine

## 2015-02-24 ENCOUNTER — Ambulatory Visit (INDEPENDENT_AMBULATORY_CARE_PROVIDER_SITE_OTHER): Payer: Medicare Other | Admitting: Internal Medicine

## 2015-02-24 VITALS — BP 151/80 | HR 93 | Temp 98.1°F | Wt 210.0 lb

## 2015-02-24 DIAGNOSIS — E118 Type 2 diabetes mellitus with unspecified complications: Secondary | ICD-10-CM

## 2015-02-24 DIAGNOSIS — K029 Dental caries, unspecified: Secondary | ICD-10-CM | POA: Diagnosis not present

## 2015-02-24 DIAGNOSIS — M7501 Adhesive capsulitis of right shoulder: Secondary | ICD-10-CM | POA: Diagnosis not present

## 2015-02-24 DIAGNOSIS — Z720 Tobacco use: Secondary | ICD-10-CM

## 2015-02-24 DIAGNOSIS — I10 Essential (primary) hypertension: Secondary | ICD-10-CM

## 2015-02-24 DIAGNOSIS — F17200 Nicotine dependence, unspecified, uncomplicated: Secondary | ICD-10-CM

## 2015-02-24 DIAGNOSIS — E119 Type 2 diabetes mellitus without complications: Secondary | ICD-10-CM

## 2015-02-24 LAB — HM DIABETES EYE EXAM

## 2015-02-24 MED ORDER — AMOXICILLIN-POT CLAVULANATE 875-125 MG PO TABS: 1.0000 | ORAL_TABLET | Freq: Two times a day (BID) | ORAL | Status: DC

## 2015-02-24 NOTE — Patient Instructions (Signed)
Thank you for your visit today.   Please return to the internal medicine clinic in 4-6 weeks or sooner if needed.     Your current medical regimen is effective;  continue present plan and take all medications as prescribed.    I have given you an antibiotic to cover you for a dental infection since you are concerned about this and have experienced dental pain and associated fevers.    Please be sure to bring all of your medications with you to every visit; this includes herbal supplements, vitamins, eye drops, and any over-the-counter medications.   Should you have any questions regarding your medications and/or any new or worsening symptoms, please be sure to call the clinic at [phone].   If you believe that you are suffering from a life threatening condition or one that may result in the loss of limb or function, then you should call 911 or proceed to the nearest Emergency Department.     A healthy lifestyle and preventative care can promote health and wellness.   Maintain regular health, dental, and eye exams.  Eat a healthy diet. Foods like vegetables, fruits, whole grains, low-fat dairy products, and lean protein foods contain the nutrients you need without too many calories. Decrease your intake of foods high in solid fats, added sugars, and salt. Get information about a proper diet from your caregiver, if necessary.  Regular physical exercise is one of the most important things you can do for your health. Most adults should get at least 150 minutes of moderate-intensity exercise (any activity that increases your heart rate and causes you to sweat) each week. In addition, most adults need muscle-strengthening exercises on 2 or more days a week.   Maintain a healthy weight. The body mass index (BMI) is a screening tool to identify possible weight problems. It provides an estimate of body fat based on height and weight. Your caregiver can help determine your BMI, and can help you  achieve or maintain a healthy weight. For adults 20 years and older:  A BMI below 18.5 is considered underweight.  A BMI of 18.5 to 24.9 is normal.  A BMI of 25 to 29.9 is considered overweight.  A BMI of 30 and above is considered obese.

## 2015-02-24 NOTE — Progress Notes (Signed)
Patient ID: [name], female   DOB: 03/11/1951, 64 y.o.   MRN: [id number]     Subjective:   [name]    DOB: 12/14/1950 64 y.o.    MRN: [id number] Health Maintenance Due: Health Maintenance Due  Topic Date Due  . COLONOSCOPY  12/30/2000  . OPHTHALMOLOGY EXAM  02/08/2014  . MAMMOGRAM  12/04/2014    _________________________________________________  HPI: Ms.Kenzlee A Nogueira is a 64 y.o. female here for a routine visit.  Pt has a PMH outlined below.  Please see problem-based charting assessment and plan note for further details of medical issues addressed at today's visit.  PMH: Past Medical History  Diagnosis Date  . Hyperlipidemia   . Hypertension   . Tobacco abuse   . COPD (chronic obstructive pulmonary disease)     History of multiple hospital admissions for exercabation   . Asthma   . Breast cancer 1991    s/p lumpectomy, chemotherapy and radiation therapy in 1991. Mammogram in 2007 was normal.  . Sigmoid diverticulitis 80/2008  . Anxiety   . Depression   . Obesity   . GERD (gastroesophageal reflux disease)   . Heart murmur 10/05/11    "first time I ever heard I had one was today"  . Pneumonia   . Shortness of breath 10/05/11    "at rest; lying down; w/exertion"  . Diabetes mellitus   . Bronchitis     h/o  . Diarrhea     h/o  . Constipated     h/o  . COPD with exacerbation 04/06/2009    Qualifier: Diagnosis of  By: [place] MD, Vijay      Medications: Current Outpatient Prescriptions on File Prior to Visit  Medication Sig Dispense Refill  . ACCU-CHEK FASTCLIX LANCETS MISC 1 each by Other route See admin instructions. Check blood sugar daily as needed for high blood sugar.    . albuterol (PROVENTIL) (2.5 MG/3ML) 0.083% nebulizer solution Take 3 mLs (2.5 mg total) by nebulization every 6 (six) hours as needed for wheezing or shortness of breath. (Patient taking differently: Take 2.5 mg by nebulization every 4 (four) hours  as needed for wheezing or shortness of breath. ) 1 vial 0  . albuterol (VENTOLIN HFA) 108 (90 BASE) MCG/ACT inhaler Inhale 2 puffs into the lungs every 6 (six) hours as needed for wheezing or shortness of breath. 1 Inhaler 3  . Blood Glucose Monitoring Suppl (ACCU-CHEK NANO SMARTVIEW) W/DEVICE KIT 1 each by Other route See admin instructions. Check blood sugar daily as needed for high blood sugar.    . budesonide-formoterol (SYMBICORT) 80-4.5 MCG/ACT inhaler Inhale 2 puffs into the lungs 2 (two) times daily as needed. For asthma (Patient taking differently: Inhale 2 puffs into the lungs 2 (two) times daily. For asthma) 1 Inhaler 12  . carvedilol (COREG) 6.25 MG tablet Take 1 tablet (6.25 mg total) by mouth 2 (two) times daily with a meal. 60 tablet 1  . citalopram (CELEXA) 20 MG tablet Take 1.5 tablets (30 mg total) by mouth daily. 45 tablet 1  . cyclobenzaprine (FLEXERIL) 5 MG tablet Take 1 tablet (5 mg total) by mouth at bedtime as needed for muscle spasms. 30 tablet 0  . diclofenac (VOLTAREN) 75 MG EC tablet Take 1 tablet (75 mg total) by mouth 3 (three) times daily. 90 tablet 1  . fluticasone (FLONASE) 50 MCG/ACT nasal spray Place 1 spray into both nostrils daily as needed for allergies. (Patient taking differently: Place 1 spray   into both nostrils daily. ) 16 g 2  . glucose blood (ACCU-CHEK SMARTVIEW) test strip Use to check blood sugar 1 to 2 times daily. diag code E 11.9. Non- insulin dependent 100 each 6  . ipratropium-albuterol (DUONEB) 0.5-2.5 (3) MG/3ML SOLN Take 3 mLs by nebulization every 4 (four) hours as needed. (Patient taking differently: Take 3 mLs by nebulization every 4 (four) hours as needed (shortness of breath). ) 360 mL 0  . losartan-hydrochlorothiazide (HYZAAR) 50-12.5 MG per tablet Take 1 tablet by mouth daily. 30 tablet 1  . metFORMIN (GLUCOPHAGE) 1000 MG tablet Take 1 tablet (1,000 mg total) by mouth 2 (two) times daily with a meal. 60 tablet 11  . pantoprazole (PROTONIX) 40  MG tablet Take 1 tablet (40 mg total) by mouth daily. 30 tablet 0  . pravastatin (PRAVACHOL) 40 MG tablet TAKE 1 TABLET BY MOUTH DAILY 90 tablet 1  . sodium chloride (OCEAN) 0.65 % SOLN nasal spray Place 1 spray into both nostrils as needed for congestion. (Patient taking differently: Place 1 spray into both nostrils daily as needed for congestion. ) 15 mL 3  . tiotropium (SPIRIVA HANDIHALER) 18 MCG inhalation capsule Place 1 capsule (18 mcg total) into inhaler and inhale daily. 30 capsule 2  . [DISCONTINUED] albuterol (PROVENTIL,VENTOLIN) 90 MCG/ACT inhaler Inhale 2 puffs into the lungs every 6 (six) hours as needed for wheezing. 17 g 12   No current facility-administered medications on file prior to visit.    Allergies: Allergies  Allergen Reactions  . Ace Inhibitors Swelling    Throat swelling.  [name] [Fluticasone] Other (See Comments)    Sinuses stop up and condition worsens    FH: Family History  Problem Relation Age of Onset  . Cancer Mother     SH: History   Social History  . Marital Status: Married    Spouse Name: N/A  . Number of Children: N/A  . Years of Education: N/A   Social History Main Topics  . Smoking status: Current Every Day Smoker -- 0.10 packs/day for 40 years    Types: Cigarettes    Last Attempt to Quit: 09/11/2014  . Smokeless tobacco: Never Used     Comment: slowly quitting.  . Alcohol Use: 2.4 oz/week    4 Cans of beer per week     Comment: "only on the weekends"  . Drug Use: No  . Sexual Activity: Not on file   Other Topics Concern  . None   Social History Narrative   Lives in [place] with her husband.   Takes care of 3 grand children.   Trying to find a job, has financial difficulties.          Review of Systems: Constitutional: Negative for fever, chills and weight loss.  Eyes: Negative for blurred vision.  Respiratory: Negative for cough and shortness of breath.  Cardiovascular: Negative for chest pain, palpitations and leg  swelling.  Gastrointestinal: Negative for nausea, vomiting, abdominal pain, diarrhea, constipation and blood in stool.  Genitourinary: Negative for dysuria, urgency and frequency.  Musculoskeletal: Negative for myalgias and back pain.  Neurological: Negative for dizziness, weakness and headaches.     Objective:   Vital Signs: Filed Vitals:   02/24/15 1352  BP: 151/80  Pulse: 93  Temp: 98.1 F (36.7 C)  TempSrc: Oral  Weight: 210 lb (95.255 kg)  SpO2: 96%     BP Readings from Last 3 Encounters:  02/24/15 151/80  01/22/15 151/69  01/02/15 160/140  Physical Exam: Constitutional: Vital signs reviewed.  Patient is in NAD and cooperative with exam.  Head: Normocephalic and atraumatic. Eyes: PERRL, EOMI, conjunctivae nl, no scleral icterus.  Neck: Supple. Cardiovascular: RRR, no MRG. Pulmonary/Chest: normal effort, CTAB, no wheezes, rales, or rhonchi. Abdominal: Soft. NT/ND +BS. Musculoskeletal: Full ROM, without pain,edema,or deformity.  Neurological: A&O x3, cranial nerves II-XII are grossly intact, moving all extremities. Extremities: 2+DP b/l; no pitting edema. Skin: Warm, dry and intact. No rash.   Assessment & Plan:   Assessment and plan was discussed and formulated with my attending.   

## 2015-02-24 NOTE — Assessment & Plan Note (Signed)
Pt c/o dental pain for several months and is concerned about infection.  Reports having fevers about a week ago. -augmentin 875 q12h x 7 days

## 2015-02-26 ENCOUNTER — Other Ambulatory Visit: Payer: Self-pay | Admitting: *Deleted

## 2015-02-26 DIAGNOSIS — J111 Influenza due to unidentified influenza virus with other respiratory manifestations: Secondary | ICD-10-CM

## 2015-02-28 ENCOUNTER — Encounter: Payer: Self-pay | Admitting: *Deleted

## 2015-02-28 NOTE — Progress Notes (Signed)
Internal Medicine Clinic Attending  Case discussed with Dr. Gill soon after the resident saw the patient.  We reviewed the resident's history and exam and pertinent patient test results.  I agree with the assessment, diagnosis, and plan of care documented in the resident's note.  

## 2015-03-03 MED ORDER — CYCLOBENZAPRINE HCL 5 MG PO TABS: 5.0000 mg | ORAL_TABLET | Freq: Every evening | ORAL | Status: DC | PRN

## 2015-03-04 ENCOUNTER — Other Ambulatory Visit: Payer: Self-pay | Admitting: *Deleted

## 2015-03-04 NOTE — Telephone Encounter (Signed)
Pt is call requesting pcp write her a rx for prednisone.  Pt sates was recently seen in our office, but she has developed cold-like symptoms and is asking for rx to prevent a hospitalization.  Attempted to contact pt for additional information, no answer, message left on recorder to contact the clinic regarding prednisone request.  Need to know what symptoms pt is currently experiencing.Christie Hidden Cassady4/26/20164:59 PM

## 2015-03-06 ENCOUNTER — Ambulatory Visit (INDEPENDENT_AMBULATORY_CARE_PROVIDER_SITE_OTHER): Payer: Medicare Other | Admitting: Internal Medicine

## 2015-03-06 ENCOUNTER — Encounter: Payer: Self-pay | Admitting: Internal Medicine

## 2015-03-06 VITALS — BP 175/77 | Temp 98.6°F | Ht 67.0 in | Wt 213.1 lb

## 2015-03-06 DIAGNOSIS — E1140 Type 2 diabetes mellitus with diabetic neuropathy, unspecified: Secondary | ICD-10-CM | POA: Diagnosis not present

## 2015-03-06 DIAGNOSIS — J441 Chronic obstructive pulmonary disease with (acute) exacerbation: Secondary | ICD-10-CM

## 2015-03-06 DIAGNOSIS — I10 Essential (primary) hypertension: Secondary | ICD-10-CM

## 2015-03-06 DIAGNOSIS — F17210 Nicotine dependence, cigarettes, uncomplicated: Secondary | ICD-10-CM | POA: Diagnosis not present

## 2015-03-06 DIAGNOSIS — M2012 Hallux valgus (acquired), left foot: Secondary | ICD-10-CM | POA: Diagnosis not present

## 2015-03-06 MED ORDER — BUDESONIDE-FORMOTEROL FUMARATE 160-4.5 MCG/ACT IN AERO: 2.0000 | INHALATION_SPRAY | Freq: Two times a day (BID) | RESPIRATORY_TRACT | Status: DC

## 2015-03-06 MED ORDER — ALBUTEROL SULFATE (2.5 MG/3ML) 0.083% IN NEBU: 2.5000 mg | INHALATION_SOLUTION | Freq: Four times a day (QID) | RESPIRATORY_TRACT | Status: DC | PRN

## 2015-03-06 MED ORDER — PREDNISONE 20 MG PO TABS: 40.0000 mg | ORAL_TABLET | Freq: Every day | ORAL | Status: AC

## 2015-03-06 MED ORDER — TIOTROPIUM BROMIDE MONOHYDRATE 18 MCG IN CAPS: 18.0000 ug | ORAL_CAPSULE | Freq: Every day | RESPIRATORY_TRACT | Status: DC

## 2015-03-06 NOTE — Patient Instructions (Signed)
General Instructions:  I want you to take 40mg  of prednisone daily for 5 days.  I have increased you symbicort to 160-4.5, please use your spacer.  Start taking spirvia every day  Quit Smoking!!!!  Please bring your medicines with you each time you come to clinic.  Medicines may include prescription medications, over-the-counter medications, herbal remedies, eye drops, vitamins, or other pills.   Progress Toward Treatment Goals:  Treatment Goal 02/24/2015  Hemoglobin A1C -  Blood pressure deteriorated  Stop smoking -    Self Care Goals & Plans:  Self Care Goal 03/06/2015  Manage my medications take my medicines as prescribed; bring my medications to every visit; refill my medications on time  Monitor my health keep track of my blood glucose; bring my glucose meter and log to each visit; check my feet daily  Eat healthy foods eat more vegetables; eat foods that are low in salt; eat baked foods instead of fried foods  Be physically active (No Data)  Stop smoking go to the [place] website (https://scott-booker.info/)    Home Blood Glucose Monitoring 01/03/2013  Check my blood sugar 3 times a day  When to check my blood sugar before dinner; before meals     Care Management & Community Referrals:  Referral 01/03/2013  Referrals made for care management support none needed

## 2015-03-07 NOTE — Assessment & Plan Note (Addendum)
-  Hypertensive today, will not make acute changes to her regimen, will have her follow up in 2-4 weeks with PCP.  May consider D/C of BB if not needed or change to more selective BB.

## 2015-03-07 NOTE — Assessment & Plan Note (Signed)
>>  ASSESSMENT AND PLAN FOR CHRONIC OBSTRUCTIVE PULMONARY DISEASE WITH BRONCHOSPASM ([place]) WRITTEN ON 03/07/2015 12:04 PM BY HOFFMAN, ERIK C, DO  - Patient with at least a moderate COPD exacerbation.  - Will treat with 40mg  of prednisone x5 days. - She needs better control of her COPD I stressed the importance of obtaining the PFTs that have been ordered. Will also increase her symbicort to 160-4.5 and she has been given a spacer.  I have discussed with her to resume taking [place] and she can use albuterol prn.

## 2015-03-07 NOTE — Progress Notes (Signed)
[facility] Subjective:   [name]   DOB: 08/05/1951 64 y.o.   MRN: [id number]  HPI: [name] is a 64 y.o. female with a PMH detailed below who presents for an acute visit for "cold."  She reports that her daughter has been sick with a cold and that her grandson is coming down with it too.  She feels that she has started to have a COPD exacerbation as she has had some wheezing, increased nebulizer use, sinus congestion and headache.  She denies any fever or chills or productive sputum.  She does report that she has upcoming grandson and granddaughter HS graduations in the next 2 weeks and is concerned she may not be able to make them.  She does report she is taking her medications (she is not taking Spiriva as she was told it was the same as another medicine and she did not need it).    Past Medical History  Diagnosis Date  . Hyperlipidemia   . Hypertension   . Tobacco abuse   . COPD (chronic obstructive pulmonary disease)     History of multiple hospital admissions for exercabation   . Asthma   . Breast cancer 1991    s/p lumpectomy, chemotherapy and radiation therapy in 1991. Mammogram in 2007 was normal.  . Sigmoid diverticulitis 80/2008  . Anxiety   . Depression   . Obesity   . GERD (gastroesophageal reflux disease)   . Heart murmur 10/05/11    "first time I ever heard I had one was today"  . Pneumonia   . Shortness of breath 10/05/11    "at rest; lying down; w/exertion"  . Diabetes mellitus   . Bronchitis     h/o  . Diarrhea     h/o  . Constipated     h/o  . COPD with exacerbation 04/06/2009    Qualifier: Diagnosis of  By: Eyvonne Mechanic MD, Vijay     Current Outpatient Prescriptions  Medication Sig Dispense Refill  . ACCU-CHEK FASTCLIX LANCETS MISC 1 each by Other route See admin instructions. Check blood sugar daily as needed for high blood sugar.    . albuterol (PROVENTIL) (2.5 MG/3ML) 0.083% nebulizer  solution Take 3 mLs (2.5 mg total) by nebulization every 6 (six) hours as needed for wheezing or shortness of breath. 360 mL 3  . albuterol (VENTOLIN HFA) 108 (90 BASE) MCG/ACT inhaler Inhale 2 puffs into the lungs every 6 (six) hours as needed for wheezing or shortness of breath. 1 Inhaler 3  . Blood Glucose Monitoring Suppl (ACCU-CHEK NANO SMARTVIEW) W/DEVICE KIT 1 each by Other route See admin instructions. Check blood sugar daily as needed for high blood sugar.    . budesonide-formoterol (SYMBICORT) 160-4.5 MCG/ACT inhaler Inhale 2 puffs into the lungs 2 (two) times daily. 1 Inhaler 12  . carvedilol (COREG) 6.25 MG tablet Take 1 tablet (6.25 mg total) by mouth 2 (two) times daily with a meal. 60 tablet 1  . citalopram (CELEXA) 20 MG tablet Take 1.5 tablets (30 mg total) by mouth daily. 45 tablet 1  . cyclobenzaprine (FLEXERIL) 5 MG tablet Take 1 tablet (5 mg total) by mouth at bedtime as needed for muscle spasms. 15 tablet 0  . diclofenac (VOLTAREN) 75 MG EC tablet Take 1 tablet (75 mg total) by mouth 3 (three) times daily. 90 tablet 1  . fluticasone (FLONASE) 50 MCG/ACT nasal spray Place 1 spray into both nostrils daily as needed for allergies. (  Patient taking differently: Place 1 spray into both nostrils daily. ) 16 g 2  . glucose blood (ACCU-CHEK SMARTVIEW) test strip Use to check blood sugar 1 to 2 times daily. diag code E 11.9. Non- insulin dependent 100 each 6  . losartan-hydrochlorothiazide (HYZAAR) 50-12.5 MG per tablet Take 1 tablet by mouth daily. 30 tablet 1  . metFORMIN (GLUCOPHAGE) 1000 MG tablet Take 1 tablet (1,000 mg total) by mouth 2 (two) times daily with a meal. 60 tablet 11  . pantoprazole (PROTONIX) 40 MG tablet Take 1 tablet (40 mg total) by mouth daily. 30 tablet 0  . pravastatin (PRAVACHOL) 40 MG tablet TAKE 1 TABLET BY MOUTH DAILY 90 tablet 1  . predniSONE (DELTASONE) 20 MG tablet Take 2 tablets (40 mg total) by mouth daily. 10 tablet 0  . sodium chloride (OCEAN) 0.65 %  SOLN nasal spray Place 1 spray into both nostrils as needed for congestion. (Patient taking differently: Place 1 spray into both nostrils daily as needed for congestion. ) 15 mL 3  . tiotropium (SPIRIVA HANDIHALER) 18 MCG inhalation capsule Place 1 capsule (18 mcg total) into inhaler and inhale daily. 30 capsule 12  . [DISCONTINUED] albuterol (PROVENTIL,VENTOLIN) 90 MCG/ACT inhaler Inhale 2 puffs into the lungs every 6 (six) hours as needed for wheezing. 17 g 12   No current facility-administered medications for this visit.   Family History  Problem Relation Age of Onset  . Cancer Mother    History   Social History  . Marital Status: Married    Spouse Name: N/A  . Number of Children: N/A  . Years of Education: 12   Occupational History  .  Unemployed   Social History Main Topics  . Smoking status: Current Every Day Smoker -- 0.10 packs/day for 40 years    Types: Cigarettes    Last Attempt to Quit: 09/11/2014  . Smokeless tobacco: Never Used     Comment: slowly quitting.DOWN TO ABOUT 6 CIGARETTES PER DAY  . Alcohol Use: 2.4 oz/week    4 Cans of beer per week     Comment: "only on the weekends"  . Drug Use: No  . Sexual Activity: Not on file   Other Topics Concern  . None   Social History Narrative   Lives in [place] with her husband.   Takes care of 3 grand children.   Trying to find a job, has financial difficulties.         Review of Systems: Per hpi  Objective:  Physical Exam: Filed Vitals:   03/06/15 1603  BP: 175/77  Temp: 98.6 F (37 C)  TempSrc: Oral  Height: _0  (1.702 m)  Weight: 213 lb 1.6 oz (96.662 kg)  SpO2: 99%  Physical Exam  Constitutional: She is well-developed, well-nourished, and in no distress.  HENT:  Head: Normocephalic and atraumatic.  Mouth/Throat: Oropharynx is clear and moist.  Cardiovascular: Normal rate, regular rhythm and normal heart sounds.   No murmur heard. Pulmonary/Chest: Effort normal and breath sounds normal. No  respiratory distress. She has no wheezes.  Abdominal: Soft. Bowel sounds are normal.  Musculoskeletal: She exhibits no edema.  Skin: Skin is warm and dry.  Nursing note and vitals reviewed.   Assessment & Plan:  Case discussed with Dr. Eppie Gibson  COPD, frequent exacerbations - Patient with at least a moderate COPD exacerbation.  - Will treat with 32m of prednisone x5 days. - She needs better control of her COPD I stressed the importance of obtaining the PFTs  that have been ordered. Will also increase her symbicort to 160-4.5 and she has been given a spacer.  I have discussed with her to resume taking [place] and she can use albuterol prn.   Hypertension, benign essential, goal below 140/90 -Hypertensive today, will not make acute changes to her regimen, will have her follow up in 2-4 weeks with PCP.  May consider D/C of BB if not needed or change to more selective BB.    Medications Ordered Meds ordered this encounter  Medications  . albuterol (PROVENTIL) (2.5 MG/3ML) 0.083% nebulizer solution    Sig: Take 3 mLs (2.5 mg total) by nebulization every 6 (six) hours as needed for wheezing or shortness of breath.    Dispense:  360 mL    Refill:  3  . budesonide-formoterol (SYMBICORT) 160-4.5 MCG/ACT inhaler    Sig: Inhale 2 puffs into the lungs 2 (two) times daily.    Dispense:  1 Inhaler    Refill:  12  . tiotropium (SPIRIVA HANDIHALER) 18 MCG inhalation capsule    Sig: Place 1 capsule (18 mcg total) into inhaler and inhale daily.    Dispense:  30 capsule    Refill:  12  . predniSONE (DELTASONE) 20 MG tablet    Sig: Take 2 tablets (40 mg total) by mouth daily.    Dispense:  10 tablet    Refill:  0   Other Orders No orders of the defined types were placed in this encounter.

## 2015-03-07 NOTE — Assessment & Plan Note (Signed)
-   Patient with at least a moderate COPD exacerbation.  - Will treat with 40mg  of prednisone x5 days. - She needs better control of her COPD I stressed the importance of obtaining the PFTs that have been ordered. Will also increase her symbicort to 160-4.5 and she has been given a spacer.  I have discussed with her to resume taking [place] and she can use albuterol prn.

## 2015-03-10 NOTE — Addendum Note (Signed)
Addended by: Oval Linsey D on: 03/10/2015 05:57 AM   Modules accepted: Level of Service

## 2015-03-10 NOTE — Progress Notes (Signed)
Case discussed with Dr. Hoffman at time of visit. We reviewed the resident's history and exam and pertinent patient test results. I agree with the assessment, diagnosis, and plan of care documented in the resident's note. 

## 2015-03-12 ENCOUNTER — Other Ambulatory Visit: Payer: Self-pay | Admitting: Internal Medicine

## 2015-04-04 ENCOUNTER — Other Ambulatory Visit: Payer: Self-pay | Admitting: Internal Medicine

## 2015-04-08 ENCOUNTER — Ambulatory Visit (INDEPENDENT_AMBULATORY_CARE_PROVIDER_SITE_OTHER): Payer: Medicare Other | Admitting: Internal Medicine

## 2015-04-08 ENCOUNTER — Encounter: Payer: Self-pay | Admitting: Internal Medicine

## 2015-04-08 VITALS — BP 187/92 | HR 91 | Temp 98.0°F | Ht 67.0 in | Wt 213.3 lb

## 2015-04-08 DIAGNOSIS — J45909 Unspecified asthma, uncomplicated: Secondary | ICD-10-CM | POA: Diagnosis not present

## 2015-04-08 DIAGNOSIS — J449 Chronic obstructive pulmonary disease, unspecified: Secondary | ICD-10-CM | POA: Diagnosis present

## 2015-04-08 DIAGNOSIS — Z7951 Long term (current) use of inhaled steroids: Secondary | ICD-10-CM

## 2015-04-08 DIAGNOSIS — J441 Chronic obstructive pulmonary disease with (acute) exacerbation: Secondary | ICD-10-CM

## 2015-04-08 DIAGNOSIS — I10 Essential (primary) hypertension: Secondary | ICD-10-CM

## 2015-04-08 MED ORDER — LORATADINE 10 MG PO TABS: 10.0000 mg | ORAL_TABLET | Freq: Every day | ORAL | Status: DC

## 2015-04-08 MED ORDER — LOSARTAN POTASSIUM-HCTZ 100-25 MG PO TABS: 1.0000 | ORAL_TABLET | Freq: Every day | ORAL | Status: DC

## 2015-04-08 MED ORDER — PREDNISONE 20 MG PO TABS: 40.0000 mg | ORAL_TABLET | Freq: Every day | ORAL | Status: DC

## 2015-04-08 MED ORDER — AZITHROMYCIN 250 MG PO TABS
ORAL_TABLET | ORAL | Status: AC
Start: 2015-04-08 — End: 2015-04-13

## 2015-04-08 NOTE — Assessment & Plan Note (Signed)
Assessment: Most likely diagnosis mild COPD exacerbation in the setting of bronchitis/allergic rhinitis/asthma. She does have wheezing on exam, but she is afebrile and breathing normally, saturating at 100% on room air. The rest of her exam is unremarkable, but she does appear fatigued. I do suspect that she probably has an acute viral bronchitis in the setting of allergic rhinitis. Given her history of COPD and asthma with recurrent hospitalizations, will need to have a more aggressive approach to her symptoms.  Plan: 1. Labs/imaging: none 2. Therapy: Z-Pak, Prednisone 40 mg once daily for 5 days and Claritin 10 mg as needed daily. Recommended Tylenol for joint pains. Continue with her current inhalers including Spiriva, albuterol, and Symbicort. She also has Flonase nasal spray. 3. Follow up when necessary. She has an appointment with Dr. Gordy Levan on 04/14/2015 where she can be reviewed for improvement of her symptoms.

## 2015-04-08 NOTE — Progress Notes (Signed)
Patient ID: [name], female   DOB: May 05, 1951, 64 y.o.   MRN: [id number]   Subjective:   HPI: [name] is a 64 y.o. woman with past medical history below presents for an acute visit.  Reason(s) for visit:  Upper respiratory tract symptoms: Patient states that she's been experiencing increasing nasal congestion, chest tightness, wheezing, loss of smell and taste sensation, history of fevers and chills even though she has not checked her temperature at home. She denies cough. She has been having headaches with rhinorrhea, but without sore throat. She has no shortness of breath. She has been experiencing increased use of her nebulizing treatment up to 2 times a day which is very unusual for her. She says that she has had some family members at home with similar problems. She has had to use Tylenol and ibuprofen without much relief. She has also been using Flonase nasal spray. She feels that she is very fatigued and she does have pains in her muscles and joints. However, she has no nausea, vomiting, abdominal pain or other constitutional symptoms.   She does have a history of asthma and COPD and she has been compliant with her inhalers.  BP is 187/92 mmHg. Her prescription includes Hyzaar 50-12.5. However, she states that she has been taking losartan 50 mg daily and she does have pill bottles for this.   Past Medical History  Diagnosis Date  . Hyperlipidemia   . Hypertension   . Tobacco abuse   . COPD (chronic obstructive pulmonary disease)     History of multiple hospital admissions for exercabation   . Asthma   . Breast cancer 1991    s/p lumpectomy, chemotherapy and radiation therapy in 1991. Mammogram in 2007 was normal.  . Sigmoid diverticulitis 80/2008  . Anxiety   . Depression   . Obesity   . GERD (gastroesophageal reflux disease)   . Heart murmur 10/05/11    "first time I ever heard I had one was today"  . Pneumonia   . Shortness of breath 10/05/11    "at  rest; lying down; w/exertion"  . Diabetes mellitus   . Bronchitis     h/o  . Diarrhea     h/o  . Constipated     h/o  . COPD with exacerbation 04/06/2009    Qualifier: Diagnosis of  By: Eyvonne Mechanic MD, Vijay      ROS:   CVS: No chest pain, palpitations and leg swelling.  GI: No abdominal pain, nausea, vomiting, bloody stools GU: No dysuria, frequency, hematuria, or flank pain.  MSK: No joint swelling, arthralgias  Psych: No depression symptoms. No SI or SA.    Objective:  Physical Exam: Filed Vitals:   04/08/15 1457  BP: 187/92  Pulse: 91  Temp: 98 F (36.7 C)  TempSrc: Oral  Height: 5\' 7"  (1.702 m)  Weight: 213 lb 4.8 oz (96.752 kg)  SpO2: 100%   General: Well nourished. No acute distress. Appears fatigued. HEENT: Normal oral mucosa. MMM. Oropharynx without erythema or exudates. Bilateral tympanic membrane exam is normal. No palpable lymph nodes in the neck. Lungs: CTA bilaterally. Mild wheezing bilaterally. Breathing is not labored. Heart: RRR; no extra sounds or murmurs  Abdomen: Non-distended, normal bowel sounds, soft, nontender; no hepatosplenomegaly  Extremities: No pedal edema. No joint swelling or tenderness. Neurologic: Normal EOM,  Alert and oriented x3. No obvious neurologic/cranial nerve deficits.  Assessment & Plan:  Discussed case with Dr Eppie Gibson See problem based charting for assessment and  plan.

## 2015-04-08 NOTE — Patient Instructions (Addendum)
General Instructions: Please take Z-pack  Please take prednisone 40 mg daily for 5 days  Please take claritin 10 mg daily as needed for runny nose Please stop taking Losartan 50 mg and start taking HCTZ-Losartan   Please come back in 1-2 months to see Dr Gordy Levan  Thank you for bringing your medicines today. This helps [place] keep you safe from mistakes.   Progress Toward Treatment Goals:  Treatment Goal 02/24/2015  Hemoglobin A1C -  Blood pressure deteriorated  Stop smoking -    Self Care Goals & Plans:  Self Care Goal 04/08/2015  Manage my medications take my medicines as prescribed; bring my medications to every visit; refill my medications on time; follow the sick day instructions if I am sick  Monitor my health keep track of my blood glucose; keep track of my blood pressure; keep track of my weight; check my feet daily  Eat healthy foods eat more vegetables; eat fruit for snacks and desserts; eat baked foods instead of fried foods; eat smaller portions; drink diet soda or water instead of juice or soda  Be physically active find an activity I enjoy  Stop smoking -    Home Blood Glucose Monitoring 01/03/2013  Check my blood sugar 3 times a day  When to check my blood sugar before dinner; before meals     Care Management & Community Referrals:  Referral 01/03/2013  Referrals made for care management support none needed

## 2015-04-08 NOTE — Assessment & Plan Note (Signed)
>>  ASSESSMENT AND PLAN FOR CHRONIC OBSTRUCTIVE PULMONARY DISEASE WITH BRONCHOSPASM ([place]) WRITTEN ON 04/08/2015  4:30 PM BY Jessee Avers, MD  Assessment: Most likely diagnosis mild COPD exacerbation in the setting of bronchitis/allergic rhinitis/asthma. She does have wheezing on exam, but she is afebrile and breathing normally, saturating at 100% on room air. The rest of her exam is unremarkable, but she does appear fatigued. I do suspect that she probably has an acute viral bronchitis in the setting of allergic rhinitis. Given her history of COPD and asthma with recurrent hospitalizations, will need to have a more aggressive approach to her symptoms.  Plan: 1. Labs/imaging: none 2. Therapy: Z-Pak, Prednisone 40 mg once daily for 5 days and Claritin 10 mg as needed daily. Recommended Tylenol for joint pains. Continue with her current inhalers including Spiriva, albuterol, and Symbicort. She also has Flonase nasal spray. 3. Follow up when necessary. She has an appointment with Dr. Gordy Levan on 04/14/2015 where she can be reviewed for improvement of her symptoms.

## 2015-04-14 ENCOUNTER — Encounter: Payer: Self-pay | Admitting: Internal Medicine

## 2015-04-14 ENCOUNTER — Ambulatory Visit (INDEPENDENT_AMBULATORY_CARE_PROVIDER_SITE_OTHER): Payer: Medicare Other | Admitting: Internal Medicine

## 2015-04-14 VITALS — BP 180/164 | HR 91 | Temp 98.4°F | Ht 67.0 in | Wt 210.5 lb

## 2015-04-14 DIAGNOSIS — F17210 Nicotine dependence, cigarettes, uncomplicated: Secondary | ICD-10-CM | POA: Diagnosis not present

## 2015-04-14 DIAGNOSIS — I10 Essential (primary) hypertension: Secondary | ICD-10-CM

## 2015-04-14 DIAGNOSIS — E119 Type 2 diabetes mellitus without complications: Secondary | ICD-10-CM

## 2015-04-14 DIAGNOSIS — Z853 Personal history of malignant neoplasm of breast: Secondary | ICD-10-CM

## 2015-04-14 DIAGNOSIS — J449 Chronic obstructive pulmonary disease, unspecified: Secondary | ICD-10-CM | POA: Diagnosis not present

## 2015-04-14 DIAGNOSIS — E118 Type 2 diabetes mellitus with unspecified complications: Secondary | ICD-10-CM

## 2015-04-14 DIAGNOSIS — J441 Chronic obstructive pulmonary disease with (acute) exacerbation: Secondary | ICD-10-CM

## 2015-04-14 DIAGNOSIS — F17200 Nicotine dependence, unspecified, uncomplicated: Secondary | ICD-10-CM

## 2015-04-14 DIAGNOSIS — Z0000 Encounter for general adult medical examination without abnormal findings: Secondary | ICD-10-CM

## 2015-04-14 LAB — POCT GLYCOSYLATED HEMOGLOBIN (HGB A1C): Hemoglobin A1C: 6.1

## 2015-04-14 LAB — GLUCOSE, CAPILLARY: Glucose-Capillary: 147 mg/dL — ABNORMAL HIGH (ref 65–99)

## 2015-04-14 MED ORDER — SALINE SPRAY 0.65 % NA SOLN: 2.0000 | Freq: Three times a day (TID) | NASAL | Status: DC

## 2015-04-14 NOTE — Assessment & Plan Note (Addendum)
Completed prednisone and azithromycin x 5 days.  O2 sat 98% on RA.  Of note, she also states she has been having a sinus infection with yellowish drainage for the past week.  No fever, chills here.  On exam, no facial tenderness.  Oropharyngeal exam wnl.  Discussed that since she had just finished a course of azithromycin that I felt she did not need another round of abx since she frequently takes them for COPD.  Advised that if she continues to have symptoms for another full week to return.   -cont to smoke 5 cig/day, encouraged cessation  -referral to Dr. Maudie Mercury to help with med compliance and education regarding inhalers

## 2015-04-14 NOTE — Assessment & Plan Note (Addendum)
-  repeat referral put in for colonoscopy; she was a no show in 2015 per Dr. Ulyses Amor office  -also has not had a diagnostic mammogram with a history of breast ca; told [name] she is fearful of getting the test but [name] and I encouraged her to do so

## 2015-04-14 NOTE — Patient Instructions (Signed)
Thank you for your visit today.   Please return to the internal medicine clinic on 04/2715 or sooner if needed.    You may use tylenol for headache not to exceed 4000mg  per day. Also, please use the nasal saline spray at least 3 times daily before using any prescription medications in your nose.     Your current medical regimen is effective;  continue present plan and take all medications as prescribed.    I have made the following referrals for you: colonoscopy, diagnostic mammogram.   Please be sure to bring all of your medications with you to every visit; this includes herbal supplements, vitamins, eye drops, and any over-the-counter medications.   Should you have any questions regarding your medications and/or any new or worsening symptoms, please be sure to call the clinic at [phone].   If you believe that you are suffering from a life threatening condition or one that may result in the loss of limb or function, then you should call 911 or proceed to the nearest Emergency Department.     A healthy lifestyle and preventative care can promote health and wellness.   Maintain regular health, dental, and eye exams.  Eat a healthy diet. Foods like vegetables, fruits, whole grains, low-fat dairy products, and lean protein foods contain the nutrients you need without too many calories. Decrease your intake of foods high in solid fats, added sugars, and salt. Get information about a proper diet from your caregiver, if necessary.  Regular physical exercise is one of the most important things you can do for your health. Most adults should get at least 150 minutes of moderate-intensity exercise (any activity that increases your heart rate and causes you to sweat) each week. In addition, most adults need muscle-strengthening exercises on 2 or more days a week.   Maintain a healthy weight. The body mass index (BMI) is a screening tool to identify possible weight problems. It provides an  estimate of body fat based on height and weight. Your caregiver can help determine your BMI, and can help you achieve or maintain a healthy weight. For adults 20 years and older:  A BMI below 18.5 is considered underweight.  A BMI of 18.5 to 24.9 is normal.  A BMI of 25 to 29.9 is considered overweight.  A BMI of 30 and above is considered obese.

## 2015-04-14 NOTE — Assessment & Plan Note (Signed)
-  check HA1c -reports compliance with metformin  -discuss in 3 weeks

## 2015-04-14 NOTE — Assessment & Plan Note (Signed)
BP elevated today.  Reports not taking carvedilol?  She reports compliance with losartan-HCTZ.   -will have her follow-up with Dr. Maudie Mercury to ensure compliance -will see back in ~3 weeks for a recheck and hopefully will have resumed carvedilol

## 2015-04-14 NOTE — Progress Notes (Signed)
Patient ID: [name], female   DOB: 10-Jan-1951, 64 y.o.   MRN: [id number]     Subjective:   [name]    DOB: 21-Feb-1951 64 y.o.    MRN: [id number] Health Maintenance Due: Health Maintenance Due  Topic Date Due  . COLONOSCOPY  12/30/2000  . MAMMOGRAM  12/04/2014    _________________________________________________  HPI: [name] is a 64 y.o. female here for a routine visit.  Pt has a PMH outlined below.  Please see problem-based charting assessment and plan note for further details of medical issues addressed at today's visit.  PMH: Past Medical History  Diagnosis Date  . Hyperlipidemia   . Hypertension   . Tobacco abuse   . COPD (chronic obstructive pulmonary disease)     History of multiple hospital admissions for exercabation   . Asthma   . Breast cancer 1991    s/p lumpectomy, chemotherapy and radiation therapy in 1991. Mammogram in 2007 was normal.  . Sigmoid diverticulitis 80/2008  . Anxiety   . Depression   . Obesity   . GERD (gastroesophageal reflux disease)   . Heart murmur 10/05/11    "first time I ever heard I had one was today"  . Pneumonia   . Shortness of breath 10/05/11    "at rest; lying down; w/exertion"  . Diabetes mellitus   . Bronchitis     h/o  . Diarrhea     h/o  . Constipated     h/o  . COPD with exacerbation 04/06/2009    Qualifier: Diagnosis of  By: Eyvonne Mechanic MD, Vijay      Medications: Current Outpatient Prescriptions on File Prior to Visit  Medication Sig Dispense Refill  . ACCU-CHEK FASTCLIX LANCETS MISC 1 each by Other route See admin instructions. Check blood sugar daily as needed for high blood sugar.    . albuterol (PROVENTIL) (2.5 MG/3ML) 0.083% nebulizer solution Take 3 mLs (2.5 mg total) by nebulization every 6 (six) hours as needed for wheezing or shortness of breath. 360 mL 3  . albuterol (VENTOLIN HFA) 108 (90 BASE) MCG/ACT inhaler Inhale 2 puffs into the lungs every 6 (six)  hours as needed for wheezing or shortness of breath. 1 Inhaler 3  . Blood Glucose Monitoring Suppl (ACCU-CHEK NANO SMARTVIEW) W/DEVICE KIT 1 each by Other route See admin instructions. Check blood sugar daily as needed for high blood sugar.    . budesonide-formoterol (SYMBICORT) 160-4.5 MCG/ACT inhaler Inhale 2 puffs into the lungs 2 (two) times daily. 1 Inhaler 12  . carvedilol (COREG) 6.25 MG tablet take 1 tablet by mouth twice a day with meals 60 tablet 3  . citalopram (CELEXA) 20 MG tablet Take 1.5 tablets (30 mg total) by mouth daily. 45 tablet 3  . cyclobenzaprine (FLEXERIL) 5 MG tablet Take 1 tablet (5 mg total) by mouth at bedtime as needed for muscle spasms. 15 tablet 0  . diclofenac (VOLTAREN) 75 MG EC tablet Take 1 tablet (75 mg total) by mouth 3 (three) times daily. 90 tablet 1  . fluticasone (FLONASE) 50 MCG/ACT nasal spray USE 2 SPRAYS IN EACH NOSTRIL EVERY DAY AS NEEDED FOR ALLERGIES 16 g 1  . glucose blood (ACCU-CHEK SMARTVIEW) test strip Use to check blood sugar 1 to 2 times daily. diag code E 11.9. Non- insulin dependent 100 each 6  . loratadine (CLARITIN) 10 MG tablet Take 1 tablet (10 mg total) by mouth daily. 30 tablet 0  . losartan-hydrochlorothiazide (HYZAAR) 100-25 MG  per tablet Take 1 tablet by mouth daily. 30 tablet 2  . metFORMIN (GLUCOPHAGE) 1000 MG tablet Take 1 tablet (1,000 mg total) by mouth 2 (two) times daily with a meal. 60 tablet 11  . pantoprazole (PROTONIX) 40 MG tablet Take 1 tablet (40 mg total) by mouth daily. 30 tablet 0  . pravastatin (PRAVACHOL) 40 MG tablet TAKE 1 TABLET BY MOUTH DAILY 90 tablet 0  . tiotropium (SPIRIVA HANDIHALER) 18 MCG inhalation capsule Place 1 capsule (18 mcg total) into inhaler and inhale daily. 30 capsule 12  . [DISCONTINUED] albuterol (PROVENTIL,VENTOLIN) 90 MCG/ACT inhaler Inhale 2 puffs into the lungs every 6 (six) hours as needed for wheezing. 17 g 12   No current facility-administered medications on file prior to visit.     Allergies: Allergies  Allergen Reactions  . Ace Inhibitors Swelling    Throat swelling.  [name] [Fluticasone] Other (See Comments)    Sinuses stop up and condition worsens    FH: Family History  Problem Relation Age of Onset  . Cancer Mother     SH: History   Social History  . Marital Status: Married    Spouse Name: N/A  . Number of Children: N/A  . Years of Education: 12   Occupational History  .  Unemployed   Social History Main Topics  . Smoking status: Current Some Day Smoker -- 0.10 packs/day for 40 years    Types: Cigarettes    Last Attempt to Quit: 09/11/2014  . Smokeless tobacco: Never Used     Comment: slowly quitting.DOWN TO ABOUT 6 CIGARETTES PER DAY  . Alcohol Use: 2.4 oz/week    4 Cans of beer per week     Comment: "only on the weekends"  . Drug Use: No  . Sexual Activity: Not on file   Other Topics Concern  . None   Social History Narrative   Lives in [place] with her husband.   Takes care of 3 grand children.   Trying to find a job, has financial difficulties.          Review of Systems: Constitutional: Negative for fever, chills and weight loss.  Eyes: Negative for blurred vision.  Respiratory: +cough and shortness of breath.  Cardiovascular: Negative for chest pain, palpitations and leg swelling.  Gastrointestinal: Negative for nausea, vomiting, abdominal pain, diarrhea, constipation and blood in stool.  Genitourinary: Negative for dysuria, urgency and frequency.  Musculoskeletal: Negative for myalgias and back pain.  Neurological: Negative for dizziness, weakness and headaches.     Objective:   Vital Signs: Filed Vitals:   04/14/15 1351  BP: 180/164  Pulse: 91  Temp: 98.4 F (36.9 C)  TempSrc: Oral  Height: _0  (1.702 m)  Weight: 95.482 kg (210 lb 8 oz)  SpO2: 100%      BP Readings from Last 3 Encounters:  04/14/15 180/164  04/08/15 187/92  03/06/15 175/77    Physical Exam: Constitutional: Vital signs  reviewed.  Patient is in NAD and cooperative with exam.  Head: Normocephalic and atraumatic. Eyes: EOMI, conjunctivae nl, no scleral icterus.  Neck: Supple. Cardiovascular: RRR, no MRG. Pulmonary/Chest: normal effort, CTAB, no wheezes, rales, or rhonchi. Abdominal: Soft. NT/ND +BS. Neurological: A&O x3, cranial nerves II-XII are grossly intact, moving all extremities. Extremities: 2+DP b/l; no pitting edema. Skin: Warm, dry and intact. No rash.   Assessment & Plan:   Assessment and plan was discussed and formulated with my attending.

## 2015-04-14 NOTE — Progress Notes (Signed)
Case discussed with Dr. Kazibwe soon after the resident saw the patient.  We reviewed the resident's history and exam and pertinent patient test results.  I agree with the assessment, diagnosis, and plan of care documented in the resident's note. 

## 2015-04-14 NOTE — Assessment & Plan Note (Signed)
>>  ASSESSMENT AND PLAN FOR CHRONIC OBSTRUCTIVE PULMONARY DISEASE WITH BRONCHOSPASM ([place]) WRITTEN ON 04/20/2015  4:58 PM BY GILL, JACQUELYN S, MD  Completed prednisone and azithromycin x 5 days.  O2 sat 98% on RA.  Of note, she also states she has been having a sinus infection with yellowish drainage for the past week.  No fever, chills here.  On exam, no facial tenderness.  Oropharyngeal exam wnl.  Discussed that since she had just finished a course of azithromycin that I felt she did not need another round of abx since she frequently takes them for COPD.  Advised that if she continues to have symptoms for another full week to return.   -cont to smoke 5 cig/day, encouraged cessation  -referral to Dr. Maudie Mercury to help with med compliance and education regarding inhalers

## 2015-04-15 LAB — MICROALBUMIN / CREATININE URINE RATIO
Creatinine, Urine: 106.4 mg/dL
MICROALB/CREAT RATIO: 4.7 mg/g (ref 0.0–30.0)
Microalb, Ur: 0.5 mg/dL (ref <2.0)

## 2015-04-20 NOTE — Assessment & Plan Note (Signed)
>>  ASSESSMENT AND PLAN FOR PERSONAL HISTORY OF BREAST CANCER WRITTEN ON 04/20/2015  4:51 PM BY Jones Bales, MD  Pt with h/o breast CA s/p lumpectomy with chemo/radiation in 1991. Mammogram 2011 was unremarkable. 12/04/12: Birads 2; repeat diagnostic mammogram in 1 year, but pt has not gotten this even though I had mentioned this numerous times.  [name] and I spoke with Ms. Cassetta and she is scared to have the mammogram because she and her mom had breast ca at the same time and her mom passed away.  She is rightfully scared but we encouraged her to get this done to alleviate anxiety that she may have surrounding the test.

## 2015-04-20 NOTE — Assessment & Plan Note (Signed)
States she and her husband who recently started chantix are going to quit together.  I encouraged her to do this. -cont to encourage cessation

## 2015-04-20 NOTE — Assessment & Plan Note (Signed)
Pt with h/o breast CA s/p lumpectomy with chemo/radiation in 1991. Mammogram 2011 was unremarkable. 12/04/12: Birads 2; repeat diagnostic mammogram in 1 year, but pt has not gotten this even though I had mentioned this numerous times.  [name] and I spoke with [name] and she is scared to have the mammogram because she and her mom had breast ca at the same time and her mom passed away.  She is rightfully scared but we encouraged her to get this done to alleviate anxiety that she may have surrounding the test.

## 2015-04-21 NOTE — Progress Notes (Signed)
Internal Medicine Clinic Attending  Case discussed with Dr. Gill soon after the resident saw the patient.  We reviewed the resident's history and exam and pertinent patient test results.  I agree with the assessment, diagnosis, and plan of care documented in the resident's note.  

## 2015-04-22 ENCOUNTER — Emergency Department (HOSPITAL_COMMUNITY): Payer: Medicare Other

## 2015-04-22 ENCOUNTER — Encounter (HOSPITAL_COMMUNITY): Payer: Self-pay

## 2015-04-22 ENCOUNTER — Emergency Department (HOSPITAL_COMMUNITY)
Admission: EM | Admit: 2015-04-22 | Discharge: 2015-04-22 | Disposition: A | Discharge status: Home / Self Care | Payer: Medicare Other | Attending: Emergency Medicine | Admitting: Emergency Medicine

## 2015-04-22 DIAGNOSIS — E119 Type 2 diabetes mellitus without complications: Secondary | ICD-10-CM | POA: Insufficient documentation

## 2015-04-22 DIAGNOSIS — Z720 Tobacco use: Secondary | ICD-10-CM | POA: Diagnosis not present

## 2015-04-22 DIAGNOSIS — Z853 Personal history of malignant neoplasm of breast: Secondary | ICD-10-CM | POA: Diagnosis not present

## 2015-04-22 DIAGNOSIS — E669 Obesity, unspecified: Secondary | ICD-10-CM | POA: Diagnosis not present

## 2015-04-22 DIAGNOSIS — R05 Cough: Secondary | ICD-10-CM | POA: Diagnosis not present

## 2015-04-22 DIAGNOSIS — Y999 Unspecified external cause status: Secondary | ICD-10-CM | POA: Diagnosis not present

## 2015-04-22 DIAGNOSIS — K219 Gastro-esophageal reflux disease without esophagitis: Secondary | ICD-10-CM | POA: Insufficient documentation

## 2015-04-22 DIAGNOSIS — S40261A Insect bite (nonvenomous) of right shoulder, initial encounter: Secondary | ICD-10-CM | POA: Diagnosis not present

## 2015-04-22 DIAGNOSIS — W57XXXA Bitten or stung by nonvenomous insect and other nonvenomous arthropods, initial encounter: Secondary | ICD-10-CM | POA: Diagnosis not present

## 2015-04-22 DIAGNOSIS — E785 Hyperlipidemia, unspecified: Secondary | ICD-10-CM | POA: Diagnosis not present

## 2015-04-22 DIAGNOSIS — F329 Major depressive disorder, single episode, unspecified: Secondary | ICD-10-CM | POA: Insufficient documentation

## 2015-04-22 DIAGNOSIS — F419 Anxiety disorder, unspecified: Secondary | ICD-10-CM | POA: Diagnosis not present

## 2015-04-22 DIAGNOSIS — R0602 Shortness of breath: Secondary | ICD-10-CM | POA: Diagnosis not present

## 2015-04-22 DIAGNOSIS — Y929 Unspecified place or not applicable: Secondary | ICD-10-CM | POA: Diagnosis not present

## 2015-04-22 DIAGNOSIS — Z7951 Long term (current) use of inhaled steroids: Secondary | ICD-10-CM | POA: Insufficient documentation

## 2015-04-22 DIAGNOSIS — S60561A Insect bite (nonvenomous) of right hand, initial encounter: Secondary | ICD-10-CM | POA: Diagnosis not present

## 2015-04-22 DIAGNOSIS — R011 Cardiac murmur, unspecified: Secondary | ICD-10-CM | POA: Diagnosis not present

## 2015-04-22 DIAGNOSIS — I10 Essential (primary) hypertension: Secondary | ICD-10-CM | POA: Insufficient documentation

## 2015-04-22 DIAGNOSIS — J449 Chronic obstructive pulmonary disease, unspecified: Secondary | ICD-10-CM | POA: Insufficient documentation

## 2015-04-22 DIAGNOSIS — Z791 Long term (current) use of non-steroidal anti-inflammatories (NSAID): Secondary | ICD-10-CM | POA: Insufficient documentation

## 2015-04-22 DIAGNOSIS — Z79899 Other long term (current) drug therapy: Secondary | ICD-10-CM | POA: Diagnosis not present

## 2015-04-22 DIAGNOSIS — S60571A Other superficial bite of hand of right hand, initial encounter: Secondary | ICD-10-CM | POA: Diagnosis not present

## 2015-04-22 DIAGNOSIS — S30860A Insect bite (nonvenomous) of lower back and pelvis, initial encounter: Secondary | ICD-10-CM | POA: Diagnosis not present

## 2015-04-22 DIAGNOSIS — Y939 Activity, unspecified: Secondary | ICD-10-CM | POA: Insufficient documentation

## 2015-04-22 DIAGNOSIS — Z8701 Personal history of pneumonia (recurrent): Secondary | ICD-10-CM | POA: Diagnosis not present

## 2015-04-22 LAB — CBC WITH DIFFERENTIAL/PLATELET
Basophils Absolute: 0 10*3/uL (ref 0.0–0.1)
Basophils Relative: 0 % (ref 0–1)
EOS ABS: 0.4 10*3/uL (ref 0.0–0.7)
EOS PCT: 6 % — AB (ref 0–5)
HEMATOCRIT: 36.8 % (ref 36.0–46.0)
HEMOGLOBIN: 12.2 g/dL (ref 12.0–15.0)
Lymphocytes Relative: 40 % (ref 12–46)
Lymphs Abs: 3 10*3/uL (ref 0.7–4.0)
MCH: 28.4 pg (ref 26.0–34.0)
MCHC: 33.2 g/dL (ref 30.0–36.0)
MCV: 85.6 fL (ref 78.0–100.0)
MONO ABS: 0.4 10*3/uL (ref 0.1–1.0)
MONOS PCT: 5 % (ref 3–12)
Neutro Abs: 3.7 10*3/uL (ref 1.7–7.7)
Neutrophils Relative %: 49 % (ref 43–77)
PLATELETS: 339 10*3/uL (ref 150–400)
RBC: 4.3 MIL/uL (ref 3.87–5.11)
RDW: 13.3 % (ref 11.5–15.5)
WBC: 7.4 10*3/uL (ref 4.0–10.5)

## 2015-04-22 LAB — I-STAT TROPONIN, ED: Troponin i, poc: 0.01 ng/mL (ref 0.00–0.08)

## 2015-04-22 LAB — BASIC METABOLIC PANEL
Anion gap: 10 (ref 5–15)
CO2: 32 mmol/L (ref 22–32)
CREATININE: 0.66 mg/dL (ref 0.44–1.00)
Calcium: 9.4 mg/dL (ref 8.9–10.3)
Chloride: 97 mmol/L — ABNORMAL LOW (ref 101–111)
Glucose, Bld: 130 mg/dL — ABNORMAL HIGH (ref 65–99)
Potassium: 3.4 mmol/L — ABNORMAL LOW (ref 3.5–5.1)
Sodium: 139 mmol/L (ref 135–145)

## 2015-04-22 LAB — I-STAT CHEM 8, ED
BUN: 4 mg/dL — AB (ref 6–20)
CREATININE: 0.8 mg/dL (ref 0.44–1.00)
Calcium, Ion: 1.17 mmol/L (ref 1.13–1.30)
Chloride: 95 mmol/L — ABNORMAL LOW (ref 101–111)
Glucose, Bld: 132 mg/dL — ABNORMAL HIGH (ref 65–99)
HCT: 38 % (ref 36.0–46.0)
HEMOGLOBIN: 12.9 g/dL (ref 12.0–15.0)
POTASSIUM: 3.4 mmol/L — AB (ref 3.5–5.1)
SODIUM: 138 mmol/L (ref 135–145)
TCO2: 27 mmol/L (ref 0–100)

## 2015-04-22 MED ORDER — DIPHENHYDRAMINE HCL 25 MG PO CAPS
25.0000 mg | ORAL_CAPSULE | Freq: Once | ORAL | Status: AC
  Administered 2015-04-22: 25 mg via ORAL
  Filled 2015-04-22: qty 1

## 2015-04-22 MED ORDER — PREDNISONE 20 MG PO TABS
40.0000 mg | ORAL_TABLET | Freq: Once | ORAL | Status: AC
  Administered 2015-04-22: 40 mg via ORAL
  Filled 2015-04-22: qty 2

## 2015-04-22 MED ORDER — IPRATROPIUM-ALBUTEROL 0.5-2.5 (3) MG/3ML IN SOLN
3.0000 mL | RESPIRATORY_TRACT | Status: DC
  Administered 2015-04-22: 3 mL via RESPIRATORY_TRACT
  Filled 2015-04-22 (×2): qty 3

## 2015-04-22 MED ORDER — PREDNISONE 20 MG PO TABS: 40.0000 mg | ORAL_TABLET | Freq: Every day | ORAL | Status: DC

## 2015-04-22 MED ORDER — CLINDAMYCIN HCL 150 MG PO CAPS: 300.0000 mg | ORAL_CAPSULE | Freq: Three times a day (TID) | ORAL | Status: DC

## 2015-04-22 MED ORDER — IBUPROFEN 800 MG PO TABS: 800.0000 mg | ORAL_TABLET | Freq: Three times a day (TID) | ORAL | Status: DC

## 2015-04-22 MED ORDER — IBUPROFEN 800 MG PO TABS
800.0000 mg | ORAL_TABLET | Freq: Once | ORAL | Status: AC
  Administered 2015-04-22: 800 mg via ORAL
  Filled 2015-04-22: qty 1

## 2015-04-22 MED ORDER — CLINDAMYCIN HCL 300 MG PO CAPS
300.0000 mg | ORAL_CAPSULE | Freq: Once | ORAL | Status: AC
  Administered 2015-04-22: 300 mg via ORAL
  Filled 2015-04-22: qty 1

## 2015-04-22 NOTE — ED Notes (Signed)
Pt arrived via POV complaints of anxiety, and swelling on right hand from bug bite.

## 2015-04-22 NOTE — ED Notes (Signed)
Patient transported to X-ray 

## 2015-04-22 NOTE — ED Notes (Signed)
MD at bedside. 

## 2015-04-22 NOTE — ED Provider Notes (Signed)
  Face-to-face evaluation   History: she has had swelling and itching in her right hand for several days after "I got bit by something". No actual known trauma. No shortness of breath or trouble breathing.   Physical exam:Alert, elderly patient in discomfort. Right hand with mild swelling dorsally. No proximal swelling or limitation of motion. No erythema.  Medical screening examination/treatment/procedure(s) were conducted as a shared visit with non-physician practitioner(s) and myself.  I personally evaluated the patient during the encounter  Daleen Bo, MD 04/23/15 [phone]

## 2015-04-22 NOTE — ED Provider Notes (Signed)
CSN: [id number]     Arrival date & time 04/22/15  4098 History   First MD Initiated Contact with Patient 04/22/15 0606     Chief Complaint  Patient presents with  . Anxiety  . Insect Bite  . Shortness of Breath     (Consider location/radiation/quality/duration/timing/severity/associated sxs/prior Treatment) HPI Comments: Patient is a 64 year old female who presents after being bitten by an insect yesterday on the right hand. Since being bitten, she reports a mild, aching pain to her right hand with associated warmth, erythema and edema. The pain does not radiate. No open wound. She is unsure of what bit her. She reports another similar bite on her left buttock and right posterior shoulder. She reports associated SOB and anxiety. No aggravating/alleviating factors.    Past Medical History  Diagnosis Date  . Hyperlipidemia   . Hypertension   . Tobacco abuse   . COPD (chronic obstructive pulmonary disease)     History of multiple hospital admissions for exercabation   . Asthma   . Breast cancer 1991    s/p lumpectomy, chemotherapy and radiation therapy in 1991. Mammogram in 2007 was normal.  . Sigmoid diverticulitis 80/2008  . Anxiety   . Depression   . Obesity   . GERD (gastroesophageal reflux disease)   . Heart murmur 10/05/11    "first time I ever heard I had one was today"  . Pneumonia   . Shortness of breath 10/05/11    "at rest; lying down; w/exertion"  . Diabetes mellitus   . Bronchitis     h/o  . Diarrhea     h/o  . Constipated     h/o  . COPD with exacerbation 04/06/2009    Qualifier: Diagnosis of  By: Eyvonne Mechanic MD, Doyne Keel     Past Surgical History  Procedure Laterality Date  . Dobutamine stress echo  08/2004    Inferior ischemia, normal LV systolic function, no significant CAD  . Abdominal hysterectomy    . Breast surgery  1991    lumphectomy right breast  . Neck surgery  2012    "Dr. Lynann Bologna  put plate in; did something to my vertebrae"   Family History   Problem Relation Age of Onset  . Cancer Mother    History  Substance Use Topics  . Smoking status: Current Some Day Smoker -- 0.10 packs/day for 40 years    Types: Cigarettes    Last Attempt to Quit: 09/11/2014  . Smokeless tobacco: Never Used     Comment: slowly quitting.DOWN TO ABOUT 6 CIGARETTES PER DAY  . Alcohol Use: 2.4 oz/week    4 Cans of beer per week     Comment: "only on the weekends"   OB History    No data available     Review of Systems  Constitutional: Negative for fever, chills and fatigue.  HENT: Negative for trouble swallowing.   Eyes: Negative for visual disturbance.  Respiratory: Positive for shortness of breath.   Cardiovascular: Negative for chest pain and palpitations.  Gastrointestinal: Negative for nausea, vomiting, abdominal pain and diarrhea.  Genitourinary: Negative for dysuria and difficulty urinating.  Musculoskeletal: Negative for arthralgias and neck pain.  Skin: Positive for wound. Negative for color change.  Neurological: Negative for dizziness and weakness.  Psychiatric/Behavioral: Negative for dysphoric mood.      Allergies  Ace inhibitors and Flonase  Home Medications   Prior to Admission medications   Medication Sig Start Date End Date Taking? Authorizing Provider  ACCU-CHEK  FASTCLIX LANCETS MISC 1 each by Other route See admin instructions. Check blood sugar daily as needed for high blood sugar.    Historical Provider, MD  albuterol (PROVENTIL) (2.5 MG/3ML) 0.083% nebulizer solution Take 3 mLs (2.5 mg total) by nebulization every 6 (six) hours as needed for wheezing or shortness of breath. 03/06/15   Lucious Groves, DO  albuterol (VENTOLIN HFA) 108 (90 BASE) MCG/ACT inhaler Inhale 2 puffs into the lungs every 6 (six) hours as needed for wheezing or shortness of breath. 08/07/14   Jones Bales, MD  Blood Glucose Monitoring Suppl (ACCU-CHEK NANO SMARTVIEW) W/DEVICE KIT 1 each by Other route See admin instructions. Check blood sugar  daily as needed for high blood sugar.    Historical Provider, MD  budesonide-formoterol (SYMBICORT) 160-4.5 MCG/ACT inhaler Inhale 2 puffs into the lungs 2 (two) times daily. 03/06/15   Lucious Groves, DO  carvedilol (COREG) 6.25 MG tablet take 1 tablet by mouth twice a day with meals 03/13/15   Jones Bales, MD  citalopram (CELEXA) 20 MG tablet Take 1.5 tablets (30 mg total) by mouth daily. 04/08/15   Jones Bales, MD  cyclobenzaprine (FLEXERIL) 5 MG tablet Take 1 tablet (5 mg total) by mouth at bedtime as needed for muscle spasms. 03/03/15   Jones Bales, MD  diclofenac (VOLTAREN) 75 MG EC tablet Take 1 tablet (75 mg total) by mouth 3 (three) times daily. 12/26/14   Langley Gauss Moding, MD  fluticasone (FLONASE) 50 MCG/ACT nasal spray USE 2 SPRAYS IN EACH NOSTRIL EVERY DAY AS NEEDED FOR ALLERGIES 04/08/15   Jones Bales, MD  glucose blood (ACCU-CHEK SMARTVIEW) test strip Use to check blood sugar 1 to 2 times daily. diag code E 11.9. Non- insulin dependent 10/02/14   Sid Falcon, MD  loratadine (CLARITIN) 10 MG tablet Take 1 tablet (10 mg total) by mouth daily. 04/08/15 04/07/16  Jessee Avers, MD  losartan-hydrochlorothiazide (HYZAAR) 100-25 MG per tablet Take 1 tablet by mouth daily. 04/08/15   Jessee Avers, MD  metFORMIN (GLUCOPHAGE) 1000 MG tablet Take 1 tablet (1,000 mg total) by mouth 2 (two) times daily with a meal. 01/03/15   Bartholomew Crews, MD  pantoprazole (PROTONIX) 40 MG tablet Take 1 tablet (40 mg total) by mouth daily. 09/18/14   Alexa Sherral Hammers, MD  pravastatin (PRAVACHOL) 40 MG tablet TAKE 1 TABLET BY MOUTH DAILY 04/08/15   Jones Bales, MD  sodium chloride (OCEAN) 0.65 % SOLN nasal spray Place 2 sprays into both nostrils 3 (three) times daily. 04/14/15   Jones Bales, MD  tiotropium (SPIRIVA HANDIHALER) 18 MCG inhalation capsule Place 1 capsule (18 mcg total) into inhaler and inhale daily. 03/06/15 03/05/16  Lucious Groves, DO   BP 142/82 mmHg  Pulse 91  Resp  14  Ht 5' 7" (1.702 m)  Wt 201 lb (91.173 kg)  BMI 31.47 kg/m2  SpO2 100% Physical Exam  Constitutional: She is oriented to person, place, and time. She appears well-developed and well-nourished. No distress.  HENT:  Head: Normocephalic and atraumatic.  Eyes: Conjunctivae and EOM are normal.  Neck: Normal range of motion.  Cardiovascular: Normal rate and regular rhythm.  Exam reveals no gallop and no friction rub.   No murmur heard. Pulmonary/Chest: Effort normal and breath sounds normal. She has no wheezes. She has no rales. She exhibits no tenderness.  Abdominal: Soft. She exhibits no distension. There is no tenderness. There is no rebound.  Musculoskeletal: Normal range  of motion.  Neurological: She is alert and oriented to person, place, and time. Coordination normal.  Speech is goal-oriented. Moves limbs without ataxia.   Skin: Skin is warm and dry.  Right dorsal hand swelling, erythema and tenderness to palpation. No open wound. Patient has 3x3cm areas of erythema and localized edema located on left upper buttock and right posterior shoulder.   Psychiatric: She has a normal mood and affect. Her behavior is normal.  Nursing note and vitals reviewed.   ED Course  Procedures (including critical care time) Labs Review Labs Reviewed  CBC WITH DIFFERENTIAL/PLATELET - Abnormal; Notable for the following:    Eosinophils Relative 6 (*)    All other components within normal limits  I-STAT CHEM 8, ED - Abnormal; Notable for the following:    Potassium 3.4 (*)    Chloride 95 (*)    BUN 4 (*)    Glucose, Bld 132 (*)    All other components within normal limits  BASIC METABOLIC PANEL  Randolm Idol, ED    Imaging Review Dg Chest 2 View  04/22/2015   CLINICAL DATA:  Cough and shortness of breath. Recent sinus infection.  EXAM: CHEST  2 VIEW  COMPARISON:  12/30/2014  FINDINGS: Hyperinflation suggesting emphysema. No focal airspace disease or consolidation. No blunting of  costophrenic angles. No pneumothorax. Normal heart size and pulmonary vascularity. Old right rib fractures. Surgical clips in the right axilla. Postoperative changes in the cervical spine.  IMPRESSION: Emphysematous changes in the lungs. No evidence of active pulmonary disease.   Electronically Signed   By: Lucienne Capers M.D.   On: 04/22/2015 06:08     EKG Interpretation   Date/Time:  Tuesday April 22 2015 05:32:50 EDT Ventricular Rate:  86 PR Interval:  147 QRS Duration: 95 QT Interval:  375 QTC Calculation: 448 R Axis:   77 Text Interpretation:  Sinus rhythm Probable left atrial enlargement  Probable anteroseptal infarct, old Baseline wander in lead(s) II III aVR  aVL aVF No significant change since last tracing Confirmed by Glynn Octave [phone]) on 04/22/2015 5:36:27 AM      MDM   Final diagnoses:  Insect bite hand, right, initial encounter    6:14 AM Labs pending. Patient has multiple infected bug bites. Patient will have clindamycin, prednisone, and ibuprofen for symptoms. Vitals stable and patient afebrile.    Alvina Chou, PA-C 04/23/15 [place], MD 04/23/15 [phone]

## 2015-04-22 NOTE — Discharge Instructions (Signed)
Take clindamycin as directed until gone. Take prednisone as directed until gone. Take ibuprofen as needed for pain. Refer to attached documents for more information.

## 2015-05-05 ENCOUNTER — Ambulatory Visit: Payer: Medicare Other | Admitting: Pharmacist

## 2015-05-05 ENCOUNTER — Encounter: Payer: Medicare Other | Admitting: Internal Medicine

## 2015-05-08 ENCOUNTER — Emergency Department (HOSPITAL_COMMUNITY): Payer: Medicare Other

## 2015-05-08 ENCOUNTER — Emergency Department (HOSPITAL_COMMUNITY)
Admission: EM | Admit: 2015-05-08 | Discharge: 2015-05-09 | Disposition: A | Discharge status: Home / Self Care | Payer: Medicare Other | Attending: Emergency Medicine | Admitting: Emergency Medicine

## 2015-05-08 ENCOUNTER — Encounter (HOSPITAL_COMMUNITY): Payer: Self-pay | Admitting: Emergency Medicine

## 2015-05-08 DIAGNOSIS — Z79899 Other long term (current) drug therapy: Secondary | ICD-10-CM | POA: Insufficient documentation

## 2015-05-08 DIAGNOSIS — E119 Type 2 diabetes mellitus without complications: Secondary | ICD-10-CM | POA: Diagnosis not present

## 2015-05-08 DIAGNOSIS — F419 Anxiety disorder, unspecified: Secondary | ICD-10-CM | POA: Diagnosis not present

## 2015-05-08 DIAGNOSIS — Z7951 Long term (current) use of inhaled steroids: Secondary | ICD-10-CM | POA: Insufficient documentation

## 2015-05-08 DIAGNOSIS — Z8719 Personal history of other diseases of the digestive system: Secondary | ICD-10-CM | POA: Diagnosis not present

## 2015-05-08 DIAGNOSIS — R0602 Shortness of breath: Secondary | ICD-10-CM | POA: Diagnosis not present

## 2015-05-08 DIAGNOSIS — Z720 Tobacco use: Secondary | ICD-10-CM | POA: Diagnosis not present

## 2015-05-08 DIAGNOSIS — I10 Essential (primary) hypertension: Secondary | ICD-10-CM | POA: Diagnosis not present

## 2015-05-08 DIAGNOSIS — J441 Chronic obstructive pulmonary disease with (acute) exacerbation: Secondary | ICD-10-CM | POA: Diagnosis not present

## 2015-05-08 DIAGNOSIS — E785 Hyperlipidemia, unspecified: Secondary | ICD-10-CM | POA: Diagnosis not present

## 2015-05-08 DIAGNOSIS — Z853 Personal history of malignant neoplasm of breast: Secondary | ICD-10-CM | POA: Diagnosis not present

## 2015-05-08 DIAGNOSIS — E669 Obesity, unspecified: Secondary | ICD-10-CM | POA: Insufficient documentation

## 2015-05-08 DIAGNOSIS — R5383 Other fatigue: Secondary | ICD-10-CM | POA: Diagnosis not present

## 2015-05-08 DIAGNOSIS — R011 Cardiac murmur, unspecified: Secondary | ICD-10-CM | POA: Insufficient documentation

## 2015-05-08 DIAGNOSIS — R05 Cough: Secondary | ICD-10-CM | POA: Diagnosis not present

## 2015-05-08 DIAGNOSIS — F329 Major depressive disorder, single episode, unspecified: Secondary | ICD-10-CM | POA: Insufficient documentation

## 2015-05-08 NOTE — ED Notes (Signed)
Pt. reports progressing SOB with productive cough /nasal congestion and chest tightness onset this week .

## 2015-05-09 LAB — CBC WITH DIFFERENTIAL/PLATELET
Basophils Absolute: 0 10*3/uL (ref 0.0–0.1)
Basophils Relative: 0 % (ref 0–1)
EOS ABS: 0.3 10*3/uL (ref 0.0–0.7)
EOS PCT: 4 % (ref 0–5)
HCT: 33.9 % — ABNORMAL LOW (ref 36.0–46.0)
HEMOGLOBIN: 11.2 g/dL — AB (ref 12.0–15.0)
LYMPHS PCT: 32 % (ref 12–46)
Lymphs Abs: 2.2 10*3/uL (ref 0.7–4.0)
MCH: 28 pg (ref 26.0–34.0)
MCHC: 33 g/dL (ref 30.0–36.0)
MCV: 84.8 fL (ref 78.0–100.0)
MONO ABS: 0.5 10*3/uL (ref 0.1–1.0)
Monocytes Relative: 6 % (ref 3–12)
NEUTROS ABS: 4 10*3/uL (ref 1.7–7.7)
Neutrophils Relative %: 58 % (ref 43–77)
Platelets: 309 10*3/uL (ref 150–400)
RBC: 4 MIL/uL (ref 3.87–5.11)
RDW: 13.2 % (ref 11.5–15.5)
WBC: 7 10*3/uL (ref 4.0–10.5)

## 2015-05-09 LAB — COMPREHENSIVE METABOLIC PANEL
ALK PHOS: 78 U/L (ref 38–126)
ALT: 12 U/L — ABNORMAL LOW (ref 14–54)
AST: 15 U/L (ref 15–41)
Albumin: 3.5 g/dL (ref 3.5–5.0)
Anion gap: 10 (ref 5–15)
BUN: 6 mg/dL (ref 6–20)
CHLORIDE: 99 mmol/L — AB (ref 101–111)
CO2: 27 mmol/L (ref 22–32)
CREATININE: 0.66 mg/dL (ref 0.44–1.00)
Calcium: 9.4 mg/dL (ref 8.9–10.3)
Glucose, Bld: 125 mg/dL — ABNORMAL HIGH (ref 65–99)
Potassium: 4.1 mmol/L (ref 3.5–5.1)
Sodium: 136 mmol/L (ref 135–145)
Total Bilirubin: 0.4 mg/dL (ref 0.3–1.2)
Total Protein: 6.3 g/dL — ABNORMAL LOW (ref 6.5–8.1)

## 2015-05-09 LAB — I-STAT TROPONIN, ED: TROPONIN I, POC: 0 ng/mL (ref 0.00–0.08)

## 2015-05-09 MED ORDER — ALBUTEROL SULFATE (2.5 MG/3ML) 0.083% IN NEBU
5.0000 mg | INHALATION_SOLUTION | Freq: Once | RESPIRATORY_TRACT | Status: AC
  Administered 2015-05-09: 5 mg via RESPIRATORY_TRACT
  Filled 2015-05-09: qty 6

## 2015-05-09 MED ORDER — METHYLPREDNISOLONE SODIUM SUCC 125 MG IJ SOLR
125.0000 mg | Freq: Once | INTRAMUSCULAR | Status: AC
  Administered 2015-05-09: 125 mg via INTRAMUSCULAR
  Filled 2015-05-09: qty 2

## 2015-05-09 MED ORDER — PREDNISONE 20 MG PO TABS: ORAL_TABLET | ORAL | Status: DC

## 2015-05-09 MED ORDER — ALBUTEROL (5 MG/ML) CONTINUOUS INHALATION SOLN
10.0000 mg/h | INHALATION_SOLUTION | RESPIRATORY_TRACT | Status: DC
  Administered 2015-05-09: 10 mg/h via RESPIRATORY_TRACT
  Filled 2015-05-09: qty 20

## 2015-05-09 NOTE — ED Notes (Signed)
Patient alert and oriented at discharge.  Patient able to ambulate to the bathroom with stable, steady gait prior to discharge.

## 2015-05-09 NOTE — Discharge Instructions (Signed)
If you have not heard from your primary physician by Tuesday, call their office to arrange follow-up. Return immediately for worsening shortness of breath, fever or for any concerns.   Chronic Obstructive Pulmonary Disease Exacerbation Chronic obstructive pulmonary disease (COPD) is a common lung condition in which airflow from the lungs is limited. COPD is a general term that can be used to describe many different lung problems that limit airflow, including chronic bronchitis and emphysema. COPD exacerbations are episodes when breathing symptoms become much worse and require extra treatment. Without treatment, COPD exacerbations can be life threatening, and frequent COPD exacerbations can cause further damage to your lungs. CAUSES   Respiratory infections.   Exposure to smoke.   Exposure to air pollution, chemical fumes, or dust. Sometimes there is no apparent cause or trigger. RISK FACTORS  Smoking cigarettes.  Older age.  Frequent prior COPD exacerbations. SIGNS AND SYMPTOMS   Increased coughing.   Increased thick spit (sputum) production.   Increased wheezing.   Increased shortness of breath.   Rapid breathing.   Chest tightness. DIAGNOSIS  Your medical history, a physical exam, and tests will help your health care provider make a diagnosis. Tests may include:  A chest X-ray.  Basic lab tests.  Sputum testing.  An arterial blood gas test. TREATMENT  Depending on the severity of your COPD exacerbation, you may need to be admitted to a hospital for treatment. Some of the treatments commonly used to treat COPD exacerbations are:   Antibiotic medicines.   Bronchodilators. These are drugs that expand the air passages. They may be given with an inhaler or nebulizer. Spacer devices may be needed to help improve drug delivery.  Corticosteroid medicines.  Supplemental oxygen therapy.  HOME CARE INSTRUCTIONS   Do not smoke. Quitting smoking is very important  to prevent COPD from getting worse and exacerbations from happening as often.  Avoid exposure to all substances that irritate the airway, especially to tobacco smoke.   If you were prescribed an antibiotic medicine, finish it all even if you start to feel better.  Take all medicines as directed by your health care provider.It is important to use correct technique with inhaled medicines.  Drink enough fluids to keep your urine clear or pale yellow (unless you have a medical condition that requires fluid restriction).  Use a cool mist vaporizer. This makes it easier to clear your chest when you cough.   If you have a home nebulizer and oxygen, continue to use them as directed.   Maintain all necessary vaccinations to prevent infections.   Exercise regularly.   Eat a healthy diet.   Keep all follow-up appointments as directed by your health care provider. SEEK IMMEDIATE MEDICAL CARE IF:  You have worsening shortness of breath.   You have trouble talking.   You have severe chest pain.  You have blood in your sputum.  You have a fever.  You have weakness, vomit repeatedly, or faint.   You feel confused.   You continue to get worse. MAKE SURE YOU:   Understand these instructions.  Will watch your condition.  Will get help right away if you are not doing well or get worse. Document Released: 08/22/2007 Document Revised: 03/11/2014 Document Reviewed: 06/29/2013 [facility] Patient Information 2015 [place], [place]. This information is not intended to replace advice given to you by your health care provider. Make sure you discuss any questions you have with your health care provider.

## 2015-05-09 NOTE — ED Provider Notes (Signed)
CSN: [id number]     Arrival date & time 05/08/15  2313 History  This chart was scribed for Christie Rice, MD by Evelene Croon, ED Scribe. This patient was seen in room A12C/A12C and the patient's care was started 12:37 AM.    Chief Complaint  Patient presents with  . Shortness of Breath   The history is provided by the patient. No language interpreter was used.     HPI Comments:  [name] is a 64 y.o. female with a PMHx of COPD, asthma and PNA, who presents to the Emergency Department complaining of constant SOB that worsened 3 days ago with initial onset ~1 month ago. Normally pt uses her nebulizer ~ once a day but she has used it 3 times yesterday with minimal relief. She reports associated nasal congestion, cough and rib pain secondary to cough. She denies fever. Pt notes she is supposed to be taking Flonase but has instead been taking Claritin D recently prescribed to her.  PCP: Gordy Levan  Past Medical History  Diagnosis Date  . Hyperlipidemia   . Hypertension   . Tobacco abuse   . COPD (chronic obstructive pulmonary disease)     History of multiple hospital admissions for exercabation   . Asthma   . Breast cancer 1991    s/p lumpectomy, chemotherapy and radiation therapy in 1991. Mammogram in 2007 was normal.  . Sigmoid diverticulitis 80/2008  . Anxiety   . Depression   . Obesity   . GERD (gastroesophageal reflux disease)   . Heart murmur 10/05/11    "first time I ever heard I had one was today"  . Pneumonia   . Shortness of breath 10/05/11    "at rest; lying down; w/exertion"  . Diabetes mellitus   . Bronchitis     h/o  . Diarrhea     h/o  . Constipated     h/o  . COPD with exacerbation 04/06/2009    Qualifier: Diagnosis of  By: Eyvonne Mechanic MD, Doyne Keel     Past Surgical History  Procedure Laterality Date  . Dobutamine stress echo  08/2004    Inferior ischemia, normal LV systolic function, no significant CAD  . Abdominal hysterectomy    . Breast surgery  1991    lumphectomy right breast  . Neck surgery  2012    "Dr. Lynann Bologna  put plate in; did something to my vertebrae"   Family History  Problem Relation Age of Onset  . Cancer Mother    History  Substance Use Topics  . Smoking status: Current Some Day Smoker -- 0.10 packs/day for 40 years    Types: Cigarettes    Last Attempt to Quit: 09/11/2014  . Smokeless tobacco: Never Used     Comment: slowly quitting.DOWN TO ABOUT 6 CIGARETTES PER DAY  . Alcohol Use: 2.4 oz/week    4 Cans of beer per week     Comment: "only on the weekends"   OB History    No data available     Review of Systems  Constitutional: Positive for fatigue. Negative for fever and chills.  HENT: Positive for congestion.   Respiratory: Positive for cough, shortness of breath and wheezing.   Cardiovascular: Negative for chest pain.  Gastrointestinal: Negative for nausea, vomiting and abdominal pain.  Musculoskeletal: Negative for myalgias, back pain, neck pain and neck stiffness.  Skin: Negative for rash and wound.  Neurological: Negative for dizziness, weakness, light-headedness, numbness and headaches.  All other systems reviewed and are negative.  Allergies  Ace inhibitors and Flonase  Home Medications   Prior to Admission medications   Medication Sig Start Date End Date Taking? Authorizing Provider  albuterol (PROVENTIL) (2.5 MG/3ML) 0.083% nebulizer solution Take 3 mLs (2.5 mg total) by nebulization every 6 (six) hours as needed for wheezing or shortness of breath. 03/06/15  Yes Lucious Groves, DO  albuterol (VENTOLIN HFA) 108 (90 BASE) MCG/ACT inhaler Inhale 2 puffs into the lungs every 6 (six) hours as needed for wheezing or shortness of breath. 08/07/14  Yes Jones Bales, MD  budesonide-formoterol (SYMBICORT) 160-4.5 MCG/ACT inhaler Inhale 2 puffs into the lungs 2 (two) times daily. 03/06/15  Yes Lucious Groves, DO  carvedilol (COREG) 6.25 MG tablet take 1 tablet by mouth twice a day with meals 03/13/15   Yes Jones Bales, MD  citalopram (CELEXA) 20 MG tablet Take 1.5 tablets (30 mg total) by mouth daily. 04/08/15  Yes Jones Bales, MD  cyclobenzaprine (FLEXERIL) 5 MG tablet Take 1 tablet (5 mg total) by mouth at bedtime as needed for muscle spasms. 03/03/15  Yes Jones Bales, MD  fluticasone (FLONASE) 50 MCG/ACT nasal spray USE 2 SPRAYS IN EACH NOSTRIL EVERY DAY AS NEEDED FOR ALLERGIES 04/08/15  Yes Jones Bales, MD  ibuprofen (ADVIL,MOTRIN) 800 MG tablet Take 1 tablet (800 mg total) by mouth 3 (three) times daily. 04/22/15  Yes Kaitlyn Szekalski, PA-C  losartan-hydrochlorothiazide (HYZAAR) 100-25 MG per tablet Take 1 tablet by mouth daily. 04/08/15  Yes Jessee Avers, MD  metFORMIN (GLUCOPHAGE) 1000 MG tablet Take 1 tablet (1,000 mg total) by mouth 2 (two) times daily with a meal. 01/03/15  Yes Bartholomew Crews, MD  pravastatin (PRAVACHOL) 40 MG tablet TAKE 1 TABLET BY MOUTH DAILY 04/08/15  Yes Jones Bales, MD  sodium chloride (OCEAN) 0.65 % SOLN nasal spray Place 2 sprays into both nostrils 3 (three) times daily. 04/14/15  Yes Jones Bales, MD  ACCU-CHEK FASTCLIX LANCETS MISC 1 each by Other route See admin instructions. Check blood sugar daily as needed for high blood sugar.    Historical Provider, MD  Blood Glucose Monitoring Suppl (ACCU-CHEK NANO SMARTVIEW) W/DEVICE KIT 1 each by Other route See admin instructions. Check blood sugar daily as needed for high blood sugar.    Historical Provider, MD  clindamycin (CLEOCIN) 150 MG capsule Take 2 capsules (300 mg total) by mouth 3 (three) times daily. May dispense as 157m capsules Patient not taking: Reported on 05/09/2015 04/22/15   KAlvina Chou PA-C  glucose blood (ACCU-CHEK SMARTVIEW) test strip Use to check blood sugar 1 to 2 times daily. diag code E 11.9. Non- insulin dependent 10/02/14   ESid Falcon MD  predniSONE (DELTASONE) 20 MG tablet 3 tabs po day one, then 2 tabs daily x 4 days 05/09/15   DJulianne Rice MD   BP  144/60 mmHg  Pulse 102  Temp(Src) 98.4 F (36.9 C)  Resp 19  SpO2 100% Physical Exam  Constitutional: She is oriented to person, place, and time. She appears well-developed and well-nourished. No distress.  HENT:  Head: Normocephalic and atraumatic.  Mouth/Throat: Oropharynx is clear and moist.  Eyes: EOM are normal. Pupils are equal, round, and reactive to light.  Neck: Normal range of motion. Neck supple.  Cardiovascular: Normal rate and regular rhythm.   Pulmonary/Chest: Effort normal. No respiratory distress. She has wheezes (inspiratory and expiratory wheezing throughout). She has no rales.  Abdominal: Soft. Bowel sounds are normal. She exhibits no distension  and no mass. There is no tenderness. There is no rebound and no guarding.  Musculoskeletal: Normal range of motion. She exhibits no edema or tenderness.  No calf swelling or tenderness.  Neurological: She is alert and oriented to person, place, and time.  Moves all extremities without deficit. Sensation is grossly intact.  Skin: Skin is warm and dry. No rash noted. No erythema.  Psychiatric: She has a normal mood and affect. Her behavior is normal.  Nursing note and vitals reviewed.   ED Course  Procedures   DIAGNOSTIC STUDIES:  Oxygen Saturation is 100% on RA, normal by my interpretation.    COORDINATION OF CARE:  12:42 AM Pt updated with imaging results. Discussed treatment plan with pt at bedside and pt agreed to plan.  Labs Review Labs Reviewed  CBC WITH DIFFERENTIAL/PLATELET - Abnormal; Notable for the following:    Hemoglobin 11.2 (*)    HCT 33.9 (*)    All other components within normal limits  COMPREHENSIVE METABOLIC PANEL - Abnormal; Notable for the following:    Chloride 99 (*)    Glucose, Bld 125 (*)    Total Protein 6.3 (*)    ALT 12 (*)    All other components within normal limits  I-STAT TROPOININ, ED    Imaging Review Dg Chest 2 View  05/08/2015   CLINICAL DATA:  64 year old female with  shortness of breath and productive cough  EXAM: CHEST  2 VIEW  COMPARISON:  Radiograph dated 04/22/2015  FINDINGS: Mild emphysema. No focal consolidation. No pleural effusion or pneumothorax. Cervical spine fixation plate and screws. Right axillary surgical clips.  IMPRESSION: No active cardiopulmonary disease.  No interval change.   Electronically Signed   By: Anner Crete M.D.   On: 05/08/2015 23:47     EKG Interpretation   Date/Time:  Thursday May 08 2015 23:19:18 EDT Ventricular Rate:  90 PR Interval:  148 QRS Duration: 84 QT Interval:  370 QTC Calculation: 452 R Axis:   81 Text Interpretation:  Normal sinus rhythm Normal ECG When compared with  ECG of 04/22/2015, No significant change was found Confirmed by [facility]  MD,  Courtnee Myer ([id number]) on 05/08/2015 11:24:37 PM      MDM   Final diagnoses:  Chronic obstructive pulmonary disease with acute exacerbation    I personally performed the services described in this documentation, which was scribed in my presence. The recorded information has been reviewed and is accurate.   Significant improvement after second nebulized treatment. Satting in the high 90s on room air. Discussed with Dr. Redmond Pulling and will arrange to have the patient followed up in the clinic next week. Return precautions given.  Christie Rice, MD 05/09/15 [phone]

## 2015-05-09 NOTE — ED Notes (Signed)
Pt c/o R sided ribcage pain from previous fractured ribs

## 2015-05-15 ENCOUNTER — Encounter: Payer: Self-pay | Admitting: Pharmacist

## 2015-05-15 ENCOUNTER — Emergency Department (HOSPITAL_COMMUNITY): Payer: Medicare Other

## 2015-05-15 ENCOUNTER — Inpatient Hospital Stay (HOSPITAL_COMMUNITY)
Admission: EM | Admit: 2015-05-15 | Discharge: 2015-05-17 | DRG: 192 | Disposition: A | Discharge status: Home / Self Care | Payer: Medicare Other | Attending: Internal Medicine | Admitting: Internal Medicine

## 2015-05-15 ENCOUNTER — Encounter (HOSPITAL_COMMUNITY): Payer: Self-pay

## 2015-05-15 DIAGNOSIS — Z888 Allergy status to other drugs, medicaments and biological substances status: Secondary | ICD-10-CM | POA: Diagnosis not present

## 2015-05-15 DIAGNOSIS — F17210 Nicotine dependence, cigarettes, uncomplicated: Secondary | ICD-10-CM | POA: Diagnosis present

## 2015-05-15 DIAGNOSIS — Z6834 Body mass index (BMI) 34.0-34.9, adult: Secondary | ICD-10-CM

## 2015-05-15 DIAGNOSIS — T380X5A Adverse effect of glucocorticoids and synthetic analogues, initial encounter: Secondary | ICD-10-CM | POA: Diagnosis present

## 2015-05-15 DIAGNOSIS — J3489 Other specified disorders of nose and nasal sinuses: Secondary | ICD-10-CM | POA: Diagnosis present

## 2015-05-15 DIAGNOSIS — E119 Type 2 diabetes mellitus without complications: Secondary | ICD-10-CM | POA: Diagnosis not present

## 2015-05-15 DIAGNOSIS — E1165 Type 2 diabetes mellitus with hyperglycemia: Secondary | ICD-10-CM | POA: Diagnosis present

## 2015-05-15 DIAGNOSIS — F329 Major depressive disorder, single episode, unspecified: Secondary | ICD-10-CM | POA: Diagnosis present

## 2015-05-15 DIAGNOSIS — J441 Chronic obstructive pulmonary disease with (acute) exacerbation: Secondary | ICD-10-CM | POA: Diagnosis not present

## 2015-05-15 DIAGNOSIS — Z79899 Other long term (current) drug therapy: Secondary | ICD-10-CM

## 2015-05-15 DIAGNOSIS — Z9221 Personal history of antineoplastic chemotherapy: Secondary | ICD-10-CM | POA: Diagnosis not present

## 2015-05-15 DIAGNOSIS — K219 Gastro-esophageal reflux disease without esophagitis: Secondary | ICD-10-CM | POA: Diagnosis present

## 2015-05-15 DIAGNOSIS — E669 Obesity, unspecified: Secondary | ICD-10-CM | POA: Diagnosis present

## 2015-05-15 DIAGNOSIS — R05 Cough: Secondary | ICD-10-CM | POA: Diagnosis not present

## 2015-05-15 DIAGNOSIS — Z7952 Long term (current) use of systemic steroids: Secondary | ICD-10-CM

## 2015-05-15 DIAGNOSIS — Z853 Personal history of malignant neoplasm of breast: Secondary | ICD-10-CM | POA: Diagnosis not present

## 2015-05-15 DIAGNOSIS — Z981 Arthrodesis status: Secondary | ICD-10-CM | POA: Diagnosis not present

## 2015-05-15 DIAGNOSIS — Z923 Personal history of irradiation: Secondary | ICD-10-CM | POA: Diagnosis not present

## 2015-05-15 DIAGNOSIS — Z90710 Acquired absence of both cervix and uterus: Secondary | ICD-10-CM | POA: Diagnosis not present

## 2015-05-15 DIAGNOSIS — F419 Anxiety disorder, unspecified: Secondary | ICD-10-CM | POA: Diagnosis present

## 2015-05-15 DIAGNOSIS — J019 Acute sinusitis, unspecified: Secondary | ICD-10-CM | POA: Diagnosis not present

## 2015-05-15 DIAGNOSIS — E1122 Type 2 diabetes mellitus with diabetic chronic kidney disease: Secondary | ICD-10-CM | POA: Diagnosis present

## 2015-05-15 DIAGNOSIS — E785 Hyperlipidemia, unspecified: Secondary | ICD-10-CM | POA: Diagnosis present

## 2015-05-15 DIAGNOSIS — J018 Other acute sinusitis: Secondary | ICD-10-CM

## 2015-05-15 DIAGNOSIS — F17200 Nicotine dependence, unspecified, uncomplicated: Secondary | ICD-10-CM | POA: Diagnosis present

## 2015-05-15 DIAGNOSIS — R0602 Shortness of breath: Secondary | ICD-10-CM | POA: Diagnosis not present

## 2015-05-15 DIAGNOSIS — I10 Essential (primary) hypertension: Secondary | ICD-10-CM | POA: Diagnosis not present

## 2015-05-15 DIAGNOSIS — J449 Chronic obstructive pulmonary disease, unspecified: Secondary | ICD-10-CM | POA: Diagnosis not present

## 2015-05-15 DIAGNOSIS — J45909 Unspecified asthma, uncomplicated: Secondary | ICD-10-CM | POA: Diagnosis present

## 2015-05-15 DIAGNOSIS — Z720 Tobacco use: Secondary | ICD-10-CM | POA: Diagnosis present

## 2015-05-15 LAB — I-STAT ARTERIAL BLOOD GAS, ED
Acid-Base Excess: 4 mmol/L — ABNORMAL HIGH (ref 0.0–2.0)
Bicarbonate: 27.2 mEq/L — ABNORMAL HIGH (ref 20.0–24.0)
O2 Saturation: 99 %
PCO2 ART: 34.6 mmHg — AB (ref 35.0–45.0)
PH ART: 7.503 — AB (ref 7.350–7.450)
Patient temperature: 98.3
TCO2: 28 mmol/L (ref 0–100)
pO2, Arterial: 103 mmHg — ABNORMAL HIGH (ref 80.0–100.0)

## 2015-05-15 LAB — BASIC METABOLIC PANEL
Anion gap: 10 (ref 5–15)
BUN: 15 mg/dL (ref 6–20)
CALCIUM: 9.1 mg/dL (ref 8.9–10.3)
CO2: 30 mmol/L (ref 22–32)
Chloride: 97 mmol/L — ABNORMAL LOW (ref 101–111)
Creatinine, Ser: 0.8 mg/dL (ref 0.44–1.00)
GFR calc Af Amer: >60 mL/min (ref >60)
GFR calc non Af Amer: >60 mL/min (ref >60)
GLUCOSE: 148 mg/dL — AB (ref 65–99)
Potassium: 3.8 mmol/L (ref 3.5–5.1)
Sodium: 137 mmol/L (ref 135–145)

## 2015-05-15 LAB — CBC
HCT: 34.1 % — ABNORMAL LOW (ref 36.0–46.0)
Hemoglobin: 11.4 g/dL — ABNORMAL LOW (ref 12.0–15.0)
MCH: 28.6 pg (ref 26.0–34.0)
MCHC: 33.4 g/dL (ref 30.0–36.0)
MCV: 85.5 fL (ref 78.0–100.0)
PLATELETS: 357 10*3/uL (ref 150–400)
RBC: 3.99 MIL/uL (ref 3.87–5.11)
RDW: 13.5 % (ref 11.5–15.5)
WBC: 11.4 10*3/uL — AB (ref 4.0–10.5)

## 2015-05-15 LAB — I-STAT TROPONIN, ED: Troponin i, poc: 0.01 ng/mL (ref 0.00–0.08)

## 2015-05-15 LAB — GLUCOSE, CAPILLARY
GLUCOSE-CAPILLARY: 203 mg/dL — AB (ref 65–99)
Glucose-Capillary: 397 mg/dL — ABNORMAL HIGH (ref 65–99)
Glucose-Capillary: 370 mg/dL — ABNORMAL HIGH (ref 65–99)

## 2015-05-15 MED ORDER — SODIUM CHLORIDE 0.9 % IJ SOLN
3.0000 mL | Freq: Two times a day (BID) | INTRAMUSCULAR | Status: DC
  Administered 2015-05-15 – 2015-05-16 (×4): 3 mL via INTRAVENOUS

## 2015-05-15 MED ORDER — HYDROCOD POLST-CPM POLST ER 10-8 MG/5ML PO SUER
5.0000 mL | Freq: Two times a day (BID) | ORAL | Status: DC | PRN
  Administered 2015-05-15: 5 mL via ORAL
  Filled 2015-05-15: qty 5

## 2015-05-15 MED ORDER — ZOLPIDEM TARTRATE 5 MG PO TABS
5.0000 mg | ORAL_TABLET | Freq: Every evening | ORAL | Status: DC | PRN
  Administered 2015-05-15: 5 mg via ORAL
  Filled 2015-05-15: qty 1

## 2015-05-15 MED ORDER — ALBUTEROL (5 MG/ML) CONTINUOUS INHALATION SOLN
10.0000 mg/h | INHALATION_SOLUTION | Freq: Once | RESPIRATORY_TRACT | Status: AC
  Administered 2015-05-15: 10 mg/h via RESPIRATORY_TRACT
  Filled 2015-05-15: qty 20

## 2015-05-15 MED ORDER — CARVEDILOL 6.25 MG PO TABS
6.2500 mg | ORAL_TABLET | Freq: Two times a day (BID) | ORAL | Status: DC
  Administered 2015-05-15 – 2015-05-17 (×5): 6.25 mg via ORAL
  Filled 2015-05-15 (×5): qty 1

## 2015-05-15 MED ORDER — INSULIN ASPART 100 UNIT/ML ~~LOC~~ SOLN
0.0000 [IU] | Freq: Three times a day (TID) | SUBCUTANEOUS | Status: DC
  Administered 2015-05-15 (×2): 9 [IU] via SUBCUTANEOUS
  Administered 2015-05-16: 3 [IU] via SUBCUTANEOUS
  Administered 2015-05-16: 5 [IU] via SUBCUTANEOUS

## 2015-05-15 MED ORDER — ALBUTEROL SULFATE (2.5 MG/3ML) 0.083% IN NEBU
2.5000 mg | INHALATION_SOLUTION | Freq: Four times a day (QID) | RESPIRATORY_TRACT | Status: DC | PRN
  Administered 2015-05-15: 2.5 mg via RESPIRATORY_TRACT
  Filled 2015-05-15: qty 3

## 2015-05-15 MED ORDER — GUAIFENESIN ER 600 MG PO TB12
1200.0000 mg | ORAL_TABLET | ORAL | Status: AC
  Administered 2015-05-15: 1200 mg via ORAL
  Filled 2015-05-15: qty 2

## 2015-05-15 MED ORDER — SODIUM CHLORIDE 0.9 % IV SOLN: 250.0000 mL | INTRAVENOUS | Status: DC | PRN

## 2015-05-15 MED ORDER — PRAVASTATIN SODIUM 40 MG PO TABS
40.0000 mg | ORAL_TABLET | Freq: Every day | ORAL | Status: DC
  Administered 2015-05-15 – 2015-05-17 (×3): 40 mg via ORAL
  Filled 2015-05-15 (×3): qty 1

## 2015-05-15 MED ORDER — METHYLPREDNISOLONE SODIUM SUCC 125 MG IJ SOLR
125.0000 mg | Freq: Once | INTRAMUSCULAR | Status: AC
  Administered 2015-05-15: 125 mg via INTRAVENOUS
  Filled 2015-05-15: qty 2

## 2015-05-15 MED ORDER — GUAIFENESIN-CODEINE 100-10 MG/5ML PO SOLN
5.0000 mL | ORAL | Status: DC | PRN
  Administered 2015-05-15 – 2015-05-17 (×4): 5 mL via ORAL
  Filled 2015-05-15 (×4): qty 5

## 2015-05-15 MED ORDER — IPRATROPIUM-ALBUTEROL 0.5-2.5 (3) MG/3ML IN SOLN
3.0000 mL | RESPIRATORY_TRACT | Status: DC
  Administered 2015-05-15 – 2015-05-16 (×9): 3 mL via RESPIRATORY_TRACT
  Filled 2015-05-15 (×9): qty 3

## 2015-05-15 MED ORDER — LOSARTAN POTASSIUM-HCTZ 100-25 MG PO TABS: 1.0000 | ORAL_TABLET | Freq: Every day | ORAL | Status: DC

## 2015-05-15 MED ORDER — LOSARTAN POTASSIUM 50 MG PO TABS
100.0000 mg | ORAL_TABLET | Freq: Every day | ORAL | Status: DC
  Administered 2015-05-15 – 2015-05-17 (×3): 100 mg via ORAL
  Filled 2015-05-15 (×3): qty 2

## 2015-05-15 MED ORDER — IPRATROPIUM-ALBUTEROL 0.5-2.5 (3) MG/3ML IN SOLN
3.0000 mL | Freq: Once | RESPIRATORY_TRACT | Status: AC
  Administered 2015-05-15: 3 mL via RESPIRATORY_TRACT
  Filled 2015-05-15: qty 3

## 2015-05-15 MED ORDER — ALBUTEROL SULFATE (2.5 MG/3ML) 0.083% IN NEBU
5.0000 mg | INHALATION_SOLUTION | Freq: Once | RESPIRATORY_TRACT | Status: AC
  Administered 2015-05-15: 5 mg via RESPIRATORY_TRACT
  Filled 2015-05-15: qty 6

## 2015-05-15 MED ORDER — SODIUM CHLORIDE 0.9 % IJ SOLN: 3.0000 mL | INTRAMUSCULAR | Status: DC | PRN

## 2015-05-15 MED ORDER — HYDROCHLOROTHIAZIDE 25 MG PO TABS
25.0000 mg | ORAL_TABLET | Freq: Every day | ORAL | Status: DC
  Administered 2015-05-15 – 2015-05-17 (×3): 25 mg via ORAL
  Filled 2015-05-15 (×3): qty 1

## 2015-05-15 MED ORDER — CITALOPRAM HYDROBROMIDE 10 MG PO TABS
30.0000 mg | ORAL_TABLET | Freq: Every day | ORAL | Status: DC
  Administered 2015-05-15 – 2015-05-17 (×3): 30 mg via ORAL
  Filled 2015-05-15 (×3): qty 3

## 2015-05-15 MED ORDER — ENOXAPARIN SODIUM 40 MG/0.4ML ~~LOC~~ SOLN
40.0000 mg | SUBCUTANEOUS | Status: DC
  Administered 2015-05-15 – 2015-05-17 (×3): 40 mg via SUBCUTANEOUS
  Filled 2015-05-15 (×3): qty 0.4

## 2015-05-15 MED ORDER — INSULIN ASPART 100 UNIT/ML ~~LOC~~ SOLN
0.0000 [IU] | Freq: Every day | SUBCUTANEOUS | Status: DC
  Administered 2015-05-15: 2 [IU] via SUBCUTANEOUS

## 2015-05-15 MED ORDER — HYDROCODONE-ACETAMINOPHEN 5-325 MG PO TABS
1.0000 | ORAL_TABLET | ORAL | Status: DC | PRN
  Administered 2015-05-15 – 2015-05-17 (×12): 1 via ORAL
  Filled 2015-05-15 (×12): qty 1

## 2015-05-15 MED ORDER — METHYLPREDNISOLONE SODIUM SUCC 125 MG IJ SOLR
80.0000 mg | Freq: Two times a day (BID) | INTRAMUSCULAR | Status: DC
  Administered 2015-05-15 – 2015-05-16 (×2): 80 mg via INTRAVENOUS
  Filled 2015-05-15 (×2): qty 2

## 2015-05-15 NOTE — ED Provider Notes (Signed)
CSN: [id number]     Arrival date & time 05/15/15  0410 History   First MD Initiated Contact with Patient 05/15/15 0539     Chief Complaint  Patient presents with  . Shortness of Breath     (Consider location/radiation/quality/duration/timing/severity/associated sxs/prior Treatment) HPI 64 year old female presents to the emergency department with complaint of persistent cough, shortness of breath, sinus pressure and drainage.  She denies any fever, but has had cold sweats.  Patient seen in the emergency department on June 30 with similar complaints.  She was placed on prednisone for possible COPD exacerbation.  She reports that she finishes her prednisone today, and has been doing her nebulized treatments every 4 hours without improvement.  She took some over-the-counter cough medicine with only minimal improvement.  Patient reports, "I'm tired".  She feels that she needs to be admitted to the hospital.  Patient reports that her ribs hurt from prior rib fractures from all of the coughing.  Patient reports that she was on antibiotics 2 weeks ago for a bug bite to her right hand and left buttock.  She reports that her left buttock is starting to itch and hurt again and she is worried that it is become reinfected.  She denies any drainage to the area. Past Medical History  Diagnosis Date  . Hyperlipidemia   . Hypertension   . Tobacco abuse   . COPD (chronic obstructive pulmonary disease)     History of multiple hospital admissions for exercabation   . Asthma   . Breast cancer 1991    s/p lumpectomy, chemotherapy and radiation therapy in 1991. Mammogram in 2007 was normal.  . Sigmoid diverticulitis 80/2008  . Anxiety   . Depression   . Obesity   . GERD (gastroesophageal reflux disease)   . Heart murmur 10/05/11    "first time I ever heard I had one was today"  . Pneumonia   . Shortness of breath 10/05/11    "at rest; lying down; w/exertion"  . Diabetes mellitus   . Bronchitis     h/o  .  Diarrhea     h/o  . Constipated     h/o  . COPD with exacerbation 04/06/2009    Qualifier: Diagnosis of  By: Eyvonne Mechanic MD, Doyne Keel     Past Surgical History  Procedure Laterality Date  . Dobutamine stress echo  08/2004    Inferior ischemia, normal LV systolic function, no significant CAD  . Abdominal hysterectomy    . Breast surgery  1991    lumphectomy right breast  . Neck surgery  2012    "Dr. Lynann Bologna  put plate in; did something to my vertebrae"   Family History  Problem Relation Age of Onset  . Cancer Mother    History  Substance Use Topics  . Smoking status: Current Some Day Smoker -- 0.10 packs/day for 40 years    Types: Cigarettes    Last Attempt to Quit: 09/11/2014  . Smokeless tobacco: Never Used     Comment: slowly quitting.DOWN TO ABOUT 6 CIGARETTES PER DAY  . Alcohol Use: 2.4 oz/week    4 Cans of beer per week     Comment: "only on the weekends"   OB History    No data available     Review of Systems   See History of Present Illness; otherwise all other systems are reviewed and negative  Allergies  Ace inhibitors and Flonase  Home Medications   Prior to Admission medications   Medication Sig  Start Date End Date Taking? Authorizing Provider  albuterol (PROVENTIL) (2.5 MG/3ML) 0.083% nebulizer solution Take 3 mLs (2.5 mg total) by nebulization every 6 (six) hours as needed for wheezing or shortness of breath. 03/06/15  Yes Erik C Hoffman, DO  albuterol (VENTOLIN HFA) 108 (90 BASE) MCG/ACT inhaler Inhale 2 puffs into the lungs every 6 (six) hours as needed for wheezing or shortness of breath. 08/07/14  Yes Jacquelyn S Gill, MD  budesonide-formoterol (SYMBICORT) 160-4.5 MCG/ACT inhaler Inhale 2 puffs into the lungs 2 (two) times daily. 03/06/15  Yes Erik C Hoffman, DO  carvedilol (COREG) 6.25 MG tablet take 1 tablet by mouth twice a day with meals 03/13/15  Yes Jacquelyn S Gill, MD  citalopram (CELEXA) 20 MG tablet Take 1.5 tablets (30 mg total) by mouth daily.  04/08/15  Yes Jacquelyn S Gill, MD  cyclobenzaprine (FLEXERIL) 5 MG tablet Take 1 tablet (5 mg total) by mouth at bedtime as needed for muscle spasms. 03/03/15  Yes Jacquelyn S Gill, MD  fluticasone (FLONASE) 50 MCG/ACT nasal spray USE 2 SPRAYS IN EACH NOSTRIL EVERY DAY AS NEEDED FOR ALLERGIES 04/08/15  Yes Jacquelyn S Gill, MD  ibuprofen (ADVIL,MOTRIN) 800 MG tablet Take 1 tablet (800 mg total) by mouth 3 (three) times daily. 04/22/15  Yes Kaitlyn Szekalski, PA-C  losartan-hydrochlorothiazide (HYZAAR) 100-25 MG per tablet Take 1 tablet by mouth daily. 04/08/15  Yes Richard Kazibwe, MD  metFORMIN (GLUCOPHAGE) 1000 MG tablet Take 1 tablet (1,000 mg total) by mouth 2 (two) times daily with a meal. 01/03/15  Yes Elizabeth A Butcher, MD  pravastatin (PRAVACHOL) 40 MG tablet TAKE 1 TABLET BY MOUTH DAILY 04/08/15  Yes Jacquelyn S Gill, MD  predniSONE (DELTASONE) 20 MG tablet 3 tabs po day one, then 2 tabs daily x 4 days Patient taking differently: Take 40 mg by mouth daily with breakfast.  05/09/15  Yes David Yelverton, MD  sodium chloride (OCEAN) 0.65 % SOLN nasal spray Place 2 sprays into both nostrils 3 (three) times daily. 04/14/15  Yes Jacquelyn S Gill, MD  ACCU-CHEK FASTCLIX LANCETS MISC 1 each by Other route See admin instructions. Check blood sugar daily as needed for high blood sugar.    Historical Provider, MD  Blood Glucose Monitoring Suppl (ACCU-CHEK NANO SMARTVIEW) W/DEVICE KIT 1 each by Other route See admin instructions. Check blood sugar daily as needed for high blood sugar.    Historical Provider, MD  clindamycin (CLEOCIN) 150 MG capsule Take 2 capsules (300 mg total) by mouth 3 (three) times daily. May dispense as 150mg capsules Patient not taking: Reported on 05/09/2015 04/22/15   Kaitlyn Szekalski, PA-C  glucose blood (ACCU-CHEK SMARTVIEW) test strip Use to check blood sugar 1 to 2 times daily. diag code E 11.9. Non- insulin dependent 10/02/14   Emily B Mullen, MD   BP 125/55 mmHg  Pulse 97   Temp(Src) 98.3 F (36.8 C) (Oral)  SpO2 97% Physical Exam  Constitutional: She is oriented to person, place, and time. She appears well-developed and well-nourished.  HENT:  Head: Normocephalic and atraumatic.  Mouth/Throat: Oropharynx is clear and moist.  No facial tenderness, turbinates are boggy.  Eyes: Conjunctivae and EOM are normal. Pupils are equal, round, and reactive to light.  Neck: Normal range of motion. Neck supple. No JVD present. No tracheal deviation present. No thyromegaly present.  Cardiovascular: Normal rate, regular rhythm, normal heart sounds and intact distal pulses.  Exam reveals no gallop and no friction rub.   No murmur heard. Pulmonary/Chest: Effort normal.   No stridor. No respiratory distress. She has wheezes. She has no rales. She exhibits no tenderness.  Patient is able to speak in short sentences.  She has pursed lip breathing and prolonged expirations.  She has diffuse wheezing after 2 neb treatments.  She has cough.  Abdominal: Soft. Bowel sounds are normal. She exhibits no distension and no mass. There is no tenderness. There is no rebound and no guarding.  Musculoskeletal: Normal range of motion. She exhibits no edema or tenderness.  Lymphadenopathy:    She has no cervical adenopathy.  Neurological: She is alert and oriented to person, place, and time. She displays normal reflexes. She exhibits normal muscle tone. Coordination normal.  Skin: Skin is warm and dry. No rash noted. No erythema. No pallor.  Psychiatric: She has a normal mood and affect. Her behavior is normal. Judgment and thought content normal.    ED Course  Procedures (including critical care time) Labs Review Labs Reviewed  BASIC METABOLIC PANEL - Abnormal; Notable for the following:    Chloride 97 (*)    Glucose, Bld 148 (*)    All other components within normal limits  CBC - Abnormal; Notable for the following:    WBC 11.4 (*)    Hemoglobin 11.4 (*)    HCT 34.1 (*)    All other  components within normal limits  I-STAT ARTERIAL BLOOD GAS, ED - Abnormal; Notable for the following:    pH, Arterial 7.503 (*)    pCO2 arterial 34.6 (*)    pO2, Arterial 103.0 (*)    Bicarbonate 27.2 (*)    Acid-Base Excess 4.0 (*)    All other components within normal limits  I-STAT TROPOININ, ED    Imaging Review Dg Chest 2 View (if Patient Has Fever And/or Copd)  05/15/2015   CLINICAL DATA:  Shortness of breath and cough  EXAM: CHEST  2 VIEW  COMPARISON:  05/08/2015  FINDINGS: Chronic hyperinflation and interstitial coarsening consistent with COPD. There is no edema, consolidation, effusion, or pneumothorax. Normal heart size and stable aortic contours.  Remote right axillary dissection.  Lower cervical discectomy.  IMPRESSION: COPD without acute superimposed disease.   Electronically Signed   By: Monte Fantasia M.D.   On: 05/15/2015 06:00     EKG Interpretation   Date/Time:  Thursday May 15 2015 05:01:54 EDT Ventricular Rate:  82 PR Interval:  145 QRS Duration: 88 QT Interval:  356 QTC Calculation: 416 R Axis:   75 Text Interpretation:  Sinus rhythm Probable left atrial enlargement No  significant change since last tracing Confirmed by Linnae Rasool  MD, Florrie Ramires ([id number])  on 05/15/2015 5:40:33 AM      MDM   Final diagnoses:  Other acute sinusitis  COPD exacerbation   64 year old female with history of COPD, recently seen in the emergency department and placed on prednisone.  Patient has persistent wheezing despite outpatient treatment with prednisone.  She has had 2 neb treatments here and is still wheezing.  Will give continuous neb.  Check ABG.  Suspect that she will need admission.  Will discuss with internal medicine group.  Linton Flemings, MD 05/15/15 [phone]

## 2015-05-15 NOTE — ED Notes (Signed)
Dr Otter at bedside  

## 2015-05-15 NOTE — ED Notes (Signed)
Admitting at bedside with the patient.   

## 2015-05-15 NOTE — ED Notes (Signed)
MD Sharol Given made aware of patients pain 9/10, no new medications to be ordered at this time, see MAR for prior administration.

## 2015-05-15 NOTE — Progress Notes (Signed)
CM received a consult for medication assistance.  Patient has Medicare, which makes her ineligible for many assistance programs sponsored by the drug companies.  CM met with patient to discuss her difficulty paying for Spiriva.  Patient was provided with paperwork regarding financial assistance that she may be eligible for based on income.  CM will continue to follow for any additional needs.

## 2015-05-15 NOTE — H&P (Signed)
Date: 05/15/2015               Patient Name:  [name] MRN: [id number]  DOB: [birth year] Age / Sex: 64 y.o., female   PCP: Jones Bales, MD         Medical Service: Internal Medicine Teaching Service         Attending Physician: Dr. Aldine Contes, MD    First Contact: Dr. Berline Lopes Pager: [phone]  Second Contact: Dr. Natasha Bence Pager: [phone]       After Hours (After 5p/  First Contact Pager: [phone]  weekends / holidays): Second Contact Pager: [phone]   Chief Complaint: SOB  History of Present Illness: [name] is a 64 year old female with an extensive PMH which includes COPD with frequent exacerbations, T2DM, HTN, HLD, and breast CA s/p lumpectomy with chemo/radiation in 1991.  She presented to the ED 1 week ago with complaints of dyspnea and was treated with a 5 day course of prednisone and instructed to follow up with her PCP.  She reports that she took the last dose of prednisone yesterday and her respiratory status had not improved so she returned to the ED for evaluation.  In the ED she was noted to have wheezing on exam that persisted despite 2x albuterol nebulizer treatments and IMTS was consulted for admission. She currently reports that she remains SOB.  She reports associated symptoms of non productive cough, some right side rib pain (attributes to cough), nasal congestion and drainage.  She reports these symptoms have been ongoing for over a week, were not much improved with the prednisone at home or her chronic COPD medications.  She does continue to smoke cigarettes and notes that she is down to about 3 cigs a day, her husband and daughter also smoke in the house.  She denies any sick contacts, denies any fever or chills.  She has had frequent COPD exacerbations over the past year both treated in the inpatient and outpatient setting.  Of concern is that in November 2015 she had vocal cord dysfunction and ultimately was intubated and had a short  stay in the ICU.  She was instructed to follow up with ENT after this episode but it does not appear that she has done that.  In her frequent COPD exacerbations she has been told she needs PFTS and this have been ordered as well as requested by her PCP but she has never had them.  Her COPD diagnosis appears to come from her clinical presentations as well as consistent CXR findings.  She does report that she takes Symbicort (BID) and Albuterol at home, she does not take spirvia as she reports it is too expensive.  Meds: Current Facility-Administered Medications  Medication Dose Route Frequency Provider Last Rate Last Dose  . chlorpheniramine-HYDROcodone (TUSSIONEX) 10-8 MG/5ML suspension 5 mL  5 mL Oral Q12H PRN Linton Flemings, MD   5 mL at 05/15/15 0608   Current Outpatient Prescriptions  Medication Sig Dispense Refill  . albuterol (PROVENTIL) (2.5 MG/3ML) 0.083% nebulizer solution Take 3 mLs (2.5 mg total) by nebulization every 6 (six) hours as needed for wheezing or shortness of breath. 360 mL 3  . albuterol (VENTOLIN HFA) 108 (90 BASE) MCG/ACT inhaler Inhale 2 puffs into the lungs every 6 (six) hours as needed for wheezing or shortness of breath. 1 Inhaler 3  . budesonide-formoterol (SYMBICORT) 160-4.5 MCG/ACT inhaler Inhale 2 puffs into the lungs 2 (two) times daily. 1 Inhaler 12  .  carvedilol (COREG) 6.25 MG tablet take 1 tablet by mouth twice a day with meals 60 tablet 3  . citalopram (CELEXA) 20 MG tablet Take 1.5 tablets (30 mg total) by mouth daily. 45 tablet 3  . cyclobenzaprine (FLEXERIL) 5 MG tablet Take 1 tablet (5 mg total) by mouth at bedtime as needed for muscle spasms. 15 tablet 0  . fluticasone (FLONASE) 50 MCG/ACT nasal spray USE 2 SPRAYS IN EACH NOSTRIL EVERY DAY AS NEEDED FOR ALLERGIES 16 g 1  . ibuprofen (ADVIL,MOTRIN) 800 MG tablet Take 1 tablet (800 mg total) by mouth 3 (three) times daily. 21 tablet 0  . losartan-hydrochlorothiazide (HYZAAR) 100-25 MG per tablet Take 1 tablet  by mouth daily. 30 tablet 2  . metFORMIN (GLUCOPHAGE) 1000 MG tablet Take 1 tablet (1,000 mg total) by mouth 2 (two) times daily with a meal. 60 tablet 11  . pravastatin (PRAVACHOL) 40 MG tablet TAKE 1 TABLET BY MOUTH DAILY 90 tablet 0  . predniSONE (DELTASONE) 20 MG tablet 3 tabs po day one, then 2 tabs daily x 4 days (Patient taking differently: Take 40 mg by mouth daily with breakfast. ) 11 tablet 0  . sodium chloride (OCEAN) 0.65 % SOLN nasal spray Place 2 sprays into both nostrils 3 (three) times daily. 15 mL 3  . ACCU-CHEK FASTCLIX LANCETS MISC 1 each by Other route See admin instructions. Check blood sugar daily as needed for high blood sugar.    . Blood Glucose Monitoring Suppl (ACCU-CHEK NANO SMARTVIEW) W/DEVICE KIT 1 each by Other route See admin instructions. Check blood sugar daily as needed for high blood sugar.    . clindamycin (CLEOCIN) 150 MG capsule Take 2 capsules (300 mg total) by mouth 3 (three) times daily. May dispense as 192m capsules (Patient not taking: Reported on 05/09/2015) 60 capsule 0  . glucose blood (ACCU-CHEK SMARTVIEW) test strip Use to check blood sugar 1 to 2 times daily. diag code E 11.9. Non- insulin dependent 100 each 6  . [DISCONTINUED] albuterol (PROVENTIL,VENTOLIN) 90 MCG/ACT inhaler Inhale 2 puffs into the lungs every 6 (six) hours as needed for wheezing. 17 g 12    Allergies: Allergies as of 05/15/2015 - Review Complete 05/15/2015  Allergen Reaction Noted  . Ace inhibitors Swelling 12/26/2014  . Flonase [fluticasone] Other (See Comments) 12/25/2014   Past Medical History  Diagnosis Date  . Hyperlipidemia   . Hypertension   . Tobacco abuse   . COPD (chronic obstructive pulmonary disease)     History of multiple hospital admissions for exercabation   . Asthma   . Breast cancer 1991    s/p lumpectomy, chemotherapy and radiation therapy in 1991. Mammogram in 2007 was normal.  . Sigmoid diverticulitis 80/2008  . Anxiety   . Depression   . Obesity     . GERD (gastroesophageal reflux disease)   . Heart murmur 10/05/11    "first time I ever heard I had one was today"  . Pneumonia   . Shortness of breath 10/05/11    "at rest; lying down; w/exertion"  . Diabetes mellitus   . Bronchitis     h/o  . Diarrhea     h/o  . Constipated     h/o  . COPD with exacerbation 04/06/2009    Qualifier: Diagnosis of  By: BEyvonne MechanicMD, VDoyne Keel    Past Surgical History  Procedure Laterality Date  . Dobutamine stress echo  08/2004    Inferior ischemia, normal LV systolic function, no significant CAD  .  Abdominal hysterectomy    . Breast surgery  1991    lumphectomy right breast  . Neck surgery  2012    "Dr. Lynann Bologna  put plate in; did something to my vertebrae"   Family History  Problem Relation Age of Onset  . Cancer Mother    History   Social History  . Marital Status: Married    Spouse Name: N/A  . Number of Children: N/A  . Years of Education: 12   Occupational History  .  Unemployed   Social History Main Topics  . Smoking status: Current Some Day Smoker -- 0.10 packs/day for 40 years    Types: Cigarettes    Last Attempt to Quit: 09/11/2014  . Smokeless tobacco: Never Used     Comment: slowly quitting.DOWN TO ABOUT 6 CIGARETTES PER DAY  . Alcohol Use: 2.4 oz/week    4 Cans of beer per week     Comment: "only on the weekends"  . Drug Use: No  . Sexual Activity: Not on file   Other Topics Concern  . Not on file   Social History Narrative   Lives in [place] with her husband.   Takes care of 3 grand children.   Trying to find a job, has financial difficulties.          Review of Systems: Review of Systems  Constitutional: Positive for malaise/fatigue. Negative for fever and chills.  HENT: Positive for congestion. Negative for sore throat.   Respiratory: Positive for cough, shortness of breath and wheezing. Negative for sputum production.   Cardiovascular: Positive for chest pain (right side). Negative for leg swelling.   Gastrointestinal: Negative for heartburn, nausea, abdominal pain, diarrhea and constipation.  Genitourinary: Negative for dysuria.  Musculoskeletal: Negative for myalgias.  Skin: Negative for rash.  Neurological: Negative for dizziness and headaches.     Physical Exam: Blood pressure 152/72, pulse 93, temperature 98.3 F (36.8 C), temperature source Oral, resp. rate 20, SpO2 100 %. Physical Exam  Constitutional: She is oriented to person, place, and time. No distress.  Currently receiving continuous albuterol nebulizer  HENT:  Head: Normocephalic and atraumatic.  Eyes: Conjunctivae are normal.  Cardiovascular: Normal rate, regular rhythm and normal heart sounds.   No murmur heard. Pulmonary/Chest: Effort normal and breath sounds normal. She has no wheezes.  Abdominal: Soft. Bowel sounds are normal. She exhibits no distension. There is no tenderness.  Musculoskeletal: Edema: trace bilaterally.  Neurological: She is alert and oriented to person, place, and time.  Skin: Skin is warm and dry.  Nursing note and vitals reviewed.    Lab results: Basic Metabolic Panel:  Recent Labs  05/15/15 0509  NA 137  K 3.8  CL 97*  CO2 30  GLUCOSE 148*  BUN 15  CREATININE 0.80  CALCIUM 9.1   Liver Function Tests: No results for input(s): AST, ALT, ALKPHOS, BILITOT, PROT, ALBUMIN in the last 72 hours. No results for input(s): LIPASE, AMYLASE in the last 72 hours. No results for input(s): AMMONIA in the last 72 hours. CBC:  Recent Labs  05/15/15 0509  WBC 11.4*  HGB 11.4*  HCT 34.1*  MCV 85.5  PLT 357   Urine Drug Screen: Drugs of Abuse     Component Value Date/Time   LABOPIA NONE DETECTED 12/29/2009 0633   COCAINSCRNUR NONE DETECTED 12/29/2009 0633   LABBENZ NONE DETECTED 12/29/2009 0633   AMPHETMU NONE DETECTED 12/29/2009 [place] DETECTED 12/29/2009 0633   LABBARB  12/29/2009 5188  NONE DETECTED        DRUG SCREEN FOR MEDICAL PURPOSES ONLY.  IF  CONFIRMATION IS NEEDED FOR ANY PURPOSE, NOTIFY LAB WITHIN 5 DAYS.        LOWEST DETECTABLE LIMITS FOR URINE DRUG SCREEN Drug Class       Cutoff (ng/mL) Amphetamine      1000 Barbiturate      200 Benzodiazepine   855 Tricyclics       015 Opiates          300 Cocaine          300 THC              50    Alcohol Level: No results for input(s): ETH in the last 72 hours. Urinalysis: No results for input(s): COLORURINE, LABSPEC, PHURINE, GLUCOSEU, HGBUR, BILIRUBINUR, KETONESUR, PROTEINUR, UROBILINOGEN, NITRITE, LEUKOCYTESUR in the last 72 hours.  Invalid input(s): APPERANCEUR   Imaging results:  Dg Chest 2 View (if Patient Has Fever And/or Copd)  05/15/2015   CLINICAL DATA:  Shortness of breath and cough  EXAM: CHEST  2 VIEW  COMPARISON:  05/08/2015  FINDINGS: Chronic hyperinflation and interstitial coarsening consistent with COPD. There is no edema, consolidation, effusion, or pneumothorax. Normal heart size and stable aortic contours.  Remote right axillary dissection.  Lower cervical discectomy.  IMPRESSION: COPD without acute superimposed disease.   Electronically Signed   By: Monte Fantasia M.D.   On: 05/15/2015 06:00    Other results: EKG: normal EKG, normal sinus rhythm, unchanged from previous tracings.  Assessment & Plan by Problem:   COPD exacerbation - Clinical presentation consistent with COPD exacerbation, like brought about by poor compliance with medications and ongoing tobacco use.  No sign of active infection. - Admit to med-surg -  Give Solu-medrol 173m x 1 in ED, will continue on Solumedrol 873mBID - Duo-nebs Q4H - Restart Spirvia on discharge, still needs formal PFTS - Consider change from Symbicort - [facility] - Care management consults for affording medications    Sinus drainage - Do not suspect acute bacterial sinusitis, mildly elevated WBC count likely due to steroids. - Solumedrol will  likely help, may need to add additional medications.    DM2 (diabetes mellitus, type 2) - Well controlled by last A1c (6.1) - Hold metformin inpatient - SSI-S while inpatient    TOBACCO ABUSE - Discussed importance of cessation    Hypertension, benign essential, goal below 140/90 - Mildly elevated - WIll resume home Losartan-HCTZ, Coreg - Consider changing Coreg to cardioselective BB.    Financial difficulties - Care management consult for medication affordability.   Dispo: Disposition is deferred at this time, awaiting improvement of current medical problems. Anticipated discharge in approximately 2 day(s).   The patient does have a current PCP (JaJones BalesMD) and [facility].  The patient does not have transportation limitations that hinder transportation to clinic appointments.  Signed: ErLucious GrovesDO 05/15/2015, 7:14 AM

## 2015-05-15 NOTE — ED Notes (Signed)
Respiratory at bedside.

## 2015-05-15 NOTE — Progress Notes (Addendum)
Patient ID: [name], female   DOB: 01/04/1951, 64 y.o.   MRN: [id number]  Patient reports adherence challenges including cost. Patient enrolled in the [facility] to Home program (will send patient a free inhaler). Also provided contact information for [facility] financial counselor and advised patient to follow up with me for medication review post discharge. Patient also referred to [facility] Quitline for tobacco cessation.   Medication Samples have been provided to the patient.  Drug: Spiriva Respimat Strength: 2.5 mcg/puff Qty: 2 LOT: [id number] A Exp.Date: 06/2016  The patient has been instructed regarding the correct time, dose, and frequency of taking this medication, including desired effects and most common side effects.   Samples signed for by Dr. Regan Rakers J 11:47 AM 05/15/2015  Patient verbalized understanding by repeating back information.

## 2015-05-15 NOTE — Progress Notes (Signed)
Utilization review completed. Adanely Reynoso, RN, BSN. 

## 2015-05-15 NOTE — ED Notes (Signed)
Pt arrived by POV c/o SOB x 2days.

## 2015-05-16 DIAGNOSIS — E785 Hyperlipidemia, unspecified: Secondary | ICD-10-CM

## 2015-05-16 DIAGNOSIS — E119 Type 2 diabetes mellitus without complications: Secondary | ICD-10-CM

## 2015-05-16 DIAGNOSIS — R0602 Shortness of breath: Secondary | ICD-10-CM

## 2015-05-16 DIAGNOSIS — I10 Essential (primary) hypertension: Secondary | ICD-10-CM

## 2015-05-16 DIAGNOSIS — J449 Chronic obstructive pulmonary disease, unspecified: Secondary | ICD-10-CM

## 2015-05-16 LAB — GLUCOSE, CAPILLARY
GLUCOSE-CAPILLARY: 186 mg/dL — AB (ref 65–99)
Glucose-Capillary: 223 mg/dL — ABNORMAL HIGH (ref 65–99)
Glucose-Capillary: 264 mg/dL — ABNORMAL HIGH (ref 65–99)
Glucose-Capillary: 305 mg/dL — ABNORMAL HIGH (ref 65–99)

## 2015-05-16 MED ORDER — IPRATROPIUM-ALBUTEROL 0.5-2.5 (3) MG/3ML IN SOLN
3.0000 mL | Freq: Four times a day (QID) | RESPIRATORY_TRACT | Status: DC
  Administered 2015-05-17 (×3): 3 mL via RESPIRATORY_TRACT
  Filled 2015-05-16 (×3): qty 3

## 2015-05-16 MED ORDER — DICLOFENAC SODIUM 1 % TD GEL
4.0000 g | Freq: Four times a day (QID) | TRANSDERMAL | Status: DC
  Administered 2015-05-16 – 2015-05-17 (×4): 4 g via TOPICAL
  Filled 2015-05-16: qty 100

## 2015-05-16 MED ORDER — LORAZEPAM 2 MG/ML IJ SOLN
0.5000 mg | Freq: Four times a day (QID) | INTRAMUSCULAR | Status: DC | PRN
  Administered 2015-05-16: 0.5 mg via INTRAVENOUS
  Filled 2015-05-16: qty 1

## 2015-05-16 MED ORDER — INSULIN ASPART 100 UNIT/ML ~~LOC~~ SOLN: 0.0000 [IU] | Freq: Every day | SUBCUTANEOUS | Status: DC

## 2015-05-16 MED ORDER — PREDNISONE 20 MG PO TABS
40.0000 mg | ORAL_TABLET | Freq: Every day | ORAL | Status: DC
  Administered 2015-05-17: 40 mg via ORAL
  Filled 2015-05-16: qty 2

## 2015-05-16 MED ORDER — BUDESONIDE-FORMOTEROL FUMARATE 160-4.5 MCG/ACT IN AERO
2.0000 | INHALATION_SPRAY | Freq: Two times a day (BID) | RESPIRATORY_TRACT | Status: DC
  Administered 2015-05-16 – 2015-05-17 (×2): 2 via RESPIRATORY_TRACT
  Filled 2015-05-16: qty 6

## 2015-05-16 MED ORDER — INSULIN ASPART 100 UNIT/ML ~~LOC~~ SOLN
0.0000 [IU] | Freq: Three times a day (TID) | SUBCUTANEOUS | Status: DC
  Administered 2015-05-16: 11 [IU] via SUBCUTANEOUS
  Administered 2015-05-17: 3 [IU] via SUBCUTANEOUS
  Administered 2015-05-17: 11 [IU] via SUBCUTANEOUS
  Administered 2015-05-17: 5 [IU] via SUBCUTANEOUS

## 2015-05-16 NOTE — Progress Notes (Signed)
Patient seen and examined. Case d/w residents in detail.  HPI: In brief, patient is a 64 y/o female with PMH of COPD with frequent exacerbations, DM, HTN, HLD and breast ca s/p lumpectomy who pw persistent SOB * 1 week. Patient states that 1 week ago she presented to the ED with dyspnea and was sent home with a 5 day course of prednisone and PO keflex. She was compliant with meds but reported no improvement in symptoms and returned to ED for follow up. Patient complains of right sided CP worse when coughing and assoc cough - initially non productive but now with thick whitish phlegm. No hemoptysis. Patient is a current smoker as well. No fevers, no sick contacts.    Physical Exam: Gen: AAO*3, NAD CVS: RRR, normal heart sounds Lungs: no wheezes, CTA b/l (on initial exam) Abd: soft, non tender, BS + Ext: no edema  Assessment and Plan: 64 y/o female with persistent SOB likely secondary to COPD exacerbation who failed outpatient therapy. Will d/c solumedrol and start PO prednisone in AM. Patient already finished a course of abx at home. Would hold off on further abx for now. C/w duonebs and restart symbicort. Would also give voltaren gel prn R chest pain.

## 2015-05-16 NOTE — Progress Notes (Signed)
Inpatient Diabetes Program Recommendations  AACE/ADA: New Consensus Statement on Inpatient Glycemic Control (2013)  Target Ranges:  Prepandial:   less than 140 mg/dL      Peak postprandial:   less than 180 mg/dL (1-2 hours)      Critically ill patients:  140 - 180 mg/dL   Inpatient Diabetes Program Recommendations Correction (SSI): consider increasing Novolog to moderate or resistant scale during steroid therapy Thank you  Raoul Pitch BSN, RN,CDE Inpatient Diabetes Coordinator [phone] (team pager)

## 2015-05-16 NOTE — Progress Notes (Signed)
Subjective: Still w/ mild SOB. Patient w/ audible wheeze w/out stethoscope, suspect some vocal cord dysfunction. Patient w/ dry cough on exam. Mildly anxious.   Objective: Vital signs in last 24 hours: Filed Vitals:   05/16/15 0925 05/16/15 1121 05/16/15 1339 05/16/15 1540  BP: 120/52  120/51   Pulse: 75  100   Temp: 98.5 F (36.9 C)  98.4 F (36.9 C)   TempSrc: Oral  Oral   Resp: 20  20   Height:      Weight:      SpO2: 97% 99% 90% 100%   Weight change:  No intake or output data in the 24 hours ending 05/16/15 1644   Physical Exam: General: Obese AA female, alert, cooperative, NAD. HEENT: PERRL, EOMI. Moist mucus membranes Neck: Full range of motion without pain, supple, no lymphadenopathy or carotid bruits Lungs: Clear to ascultation bilaterally. Very faint wheezes in the bases. Loud wheeze heard in upper airway. No rales or rhonchi. Loud dry cough intermittently.  Heart: RRR, no murmurs, gallops, or rubs Abdomen: Soft, non-tender, non-distended, BS + Extremities: No cyanosis, clubbing, or edema Neurologic: Alert & oriented x3, cranial nerves II-XII intact, strength grossly intact, sensation intact to light touch   Lab Results: Basic Metabolic Panel:  Recent Labs Lab 05/15/15 0509  NA 137  K 3.8  CL 97*  CO2 30  GLUCOSE 148*  BUN 15  CREATININE 0.80  CALCIUM 9.1   CBC:  Recent Labs Lab 05/15/15 0509  WBC 11.4*  HGB 11.4*  HCT 34.1*  MCV 85.5  PLT 357   CBG:  Recent Labs Lab 05/15/15 1108 05/15/15 1637 05/15/15 2105 05/16/15 0631 05/16/15 1106  GLUCAP 397* 370* 203* 223* 264*   Studies/Results: Dg Chest 2 View (if Patient Has Fever And/or Copd)  05/15/2015   CLINICAL DATA:  Shortness of breath and cough  EXAM: CHEST  2 VIEW  COMPARISON:  05/08/2015  FINDINGS: Chronic hyperinflation and interstitial coarsening consistent with COPD. There is no edema, consolidation, effusion, or pneumothorax. Normal heart size and stable aortic contours.   Remote right axillary dissection.  Lower cervical discectomy.  IMPRESSION: COPD without acute superimposed disease.   Electronically Signed   By: Monte Fantasia M.D.   On: 05/15/2015 06:00   Medications: I have reviewed the patient's current medications. Scheduled Meds: . budesonide-formoterol  2 puff Inhalation BID  . carvedilol  6.25 mg Oral BID WC  . citalopram  30 mg Oral Daily  . diclofenac sodium  4 g Topical QID  . enoxaparin (LOVENOX) injection  40 mg Subcutaneous Q24H  . losartan  100 mg Oral Daily   And  . hydrochlorothiazide  25 mg Oral Daily  . insulin aspart  0-5 Units Subcutaneous QHS  . insulin aspart  0-9 Units Subcutaneous TID WC  . ipratropium-albuterol  3 mL Nebulization Q4H  . pravastatin  40 mg Oral Daily  . [START ON 05/17/2015] predniSONE  40 mg Oral Q breakfast  . sodium chloride  3 mL Intravenous Q12H   Continuous Infusions:  PRN Meds:.sodium chloride, albuterol, guaiFENesin-codeine, HYDROcodone-acetaminophen, LORazepam, sodium chloride, zolpidem   Assessment/Plan: [name] is a 64 y.o. female w/ PMHx of HTN, HLD, COPD (no previous PFT's), vocal cord dysfunction, h/o right-sided breast CA in remission, GERD, and DM type II, admitted for COPD exacerbation.   COPD Exacerbation: Still w/ mild SOB and significant dry cough causing her to desat. Suspect that much of this is anxiety related as patient has a known  h/o vocal cord dysfunction and audible wheeze w/out stethoscope. Has been intubated in the past for respiratory failure. ABG on admission significant for respiratory alkalosis. Only mid CO2 retainer at baseline (baseline CO2 29-30) per previous BMP's.  -Change to Prednisone 40 mg daily (tomorrow) for 5 day course -Continue Duoneb -Restart Symbicort -Continue Robitussin AC for cough -Start Ativan 0.5 mg q6h prn for anxiety -Will need to discuss importance of smoking cessation -Voltaren gel + Vicodin prn for rib pain  DM type II: CBG's  elevated, most likely related to steroids. Trend as follows:   Recent Labs Lab 05/15/15 1108 05/15/15 1637 05/15/15 2105 05/16/15 0631 05/16/15 1106  GLUCAP 397* 370* 203* 223* 264*  -Increase to ISS-M + HS coverage -can add basal insulin in AM if needed  HTN: Stable.  -Continue Cozaar + HCTZ  HLD: Stable. -Pravachol  DVT/PE PPx: Lovenox [place]  Dispo: Disposition is deferred at this time, awaiting improvement of current medical problems.  Anticipated discharge in approximately 1-2 day(s).   The patient does have a current PCP (Annleigh Knueppel Bales, MD) and does need an [facility] hospital follow-up appointment after discharge.  The patient does have transportation limitations that hinder transportation to clinic appointments.  .Services Needed at time of discharge: Y = Yes, Blank = No PT:   OT:   RN:   Equipment:   Other:     LOS: 1 day   Corky Sox, MD 05/16/2015, 4:44 PM

## 2015-05-17 LAB — GLUCOSE, CAPILLARY
GLUCOSE-CAPILLARY: 151 mg/dL — AB (ref 65–99)
Glucose-Capillary: 208 mg/dL — ABNORMAL HIGH (ref 65–99)
Glucose-Capillary: 324 mg/dL — ABNORMAL HIGH (ref 65–99)

## 2015-05-17 MED ORDER — ACETAMINOPHEN 325 MG PO TABS
650.0000 mg | ORAL_TABLET | Freq: Four times a day (QID) | ORAL | Status: DC | PRN
  Administered 2015-05-17: 650 mg via ORAL
  Filled 2015-05-17: qty 2

## 2015-05-17 MED ORDER — PREDNISONE 20 MG PO TABS: 40.0000 mg | ORAL_TABLET | Freq: Every day | ORAL | Status: DC

## 2015-05-17 MED ORDER — GUAIFENESIN-CODEINE 100-10 MG/5ML PO SOLN: 5.0000 mL | ORAL | Status: DC | PRN

## 2015-05-17 MED ORDER — SENNOSIDES-DOCUSATE SODIUM 8.6-50 MG PO TABS: 1.0000 | ORAL_TABLET | Freq: Two times a day (BID) | ORAL | Status: DC | PRN

## 2015-05-17 NOTE — Care Management Note (Signed)
Case Management Note  Patient Details  Name: TAHANI POTIER MRN: [id number] Date of Birth: [birth year]  Subjective/Objective:                    Action/Plan:   Expected Discharge Date:                  Expected Discharge Plan:     In-House Referral:     Discharge planning Services  CM Consult  Post Acute Care Choice:    Choice offered to:  Patient  DME Arranged:    DME Agency:     HH Arranged:    [place] Agency:     Status of Service:  In process, will continue to follow  Medicare Important Message 7/9/16Given:   yes Date Medicare IM Given:   05/17/15 Medicare IM give by:   Marylyn Ishihara, RN, BSN, CCM Date Additional Medicare IM Given:    Additional Medicare Important Message give by:     If discussed at [place] of Stay Meetings, dates discussed:    Additional Comments:  Dimas Aguas, RN 05/17/2015, 12:42 PM

## 2015-05-17 NOTE — Discharge Instructions (Signed)
1. You have a follow up appointment as follows:  [name]  On 05/23/2015 9:30 AM  [place] [id number] [phone]  2. Please take all medications as previously prescribed with the following changes:  Take Prednisone 40 mg daily for 4 more days (starting tomorrow).  Take cough medicine every 4 hours as needed for cough.   USE YOUR INHALERS AS PREVIOUSLY PRESCRIBED.  3. If you have worsening of your symptoms or new symptoms arise, please call the clinic ([phone]), or go to the ER immediately if symptoms are severe.

## 2015-05-17 NOTE — Care Management Note (Signed)
Case Management Note  Patient Details  Name: [name] MRN: [id number] Date of Birth: 1951/07/22  Subjective/Objective:                    Action/Plan:   Expected Discharge Date:                  Expected Discharge Plan:     In-House Referral:     Discharge planning Services  CM Consult  Post Acute Care Choice:    Choice offered to:  Patient  DME Arranged:    DME Agency:     HH Arranged:    [place] Agency:     Status of Service:  In process, will continue to follow  Medicare Important Message Given:   yes Date Medicare IM Given:   05/17/15 Medicare IM give by:   Marylyn Ishihara, RN, BSN, CCM Date Additional Medicare IM Given:    Additional Medicare Important Message give by:     If discussed at [place] of Stay Meetings, dates discussed:    Additional Comments:  Dimas Aguas, RN 05/17/2015, 11:32 AM

## 2015-05-17 NOTE — Progress Notes (Addendum)
Subjective: Still w/ audible monophonic wheeze heard without stethoscope. SpO2 100% on RA. Anxious about going home.   Objective: Vital signs in last 24 hours: Filed Vitals:   05/16/15 2153 05/17/15 0157 05/17/15 0239 05/17/15 0554  BP: 139/71 151/72  125/65  Pulse: 72 72 77 71  Temp: 98.4 F (36.9 C) 98.1 F (36.7 C)  98.1 F (36.7 C)  TempSrc: Oral Oral  Oral  Resp: 20 20 20 18   Height:      Weight:      SpO2: 96% 99% 98% 98%   Weight change:  No intake or output data in the 24 hours ending 05/17/15 0813   Physical Exam: General: Obese AA female, alert, cooperative, NAD. HEENT: PERRL, EOMI. Moist mucus membranes Neck: Full range of motion without pain, supple, no lymphadenopathy or carotid bruits Lungs: Clear to ascultation bilaterally. Very faint wheezes in the bases. Loud wheeze heard in upper airway. No rales or rhonchi.  Heart: RRR, no murmurs, gallops, or rubs Abdomen: Soft, non-tender, non-distended, BS + Extremities: No cyanosis, clubbing, or edema Neurologic: Alert & oriented x3, cranial nerves II-XII intact, strength grossly intact, sensation intact to light touch   Lab Results: Basic Metabolic Panel:  Recent Labs Lab 05/15/15 0509  NA 137  K 3.8  CL 97*  CO2 30  GLUCOSE 148*  BUN 15  CREATININE 0.80  CALCIUM 9.1   CBC:  Recent Labs Lab 05/15/15 0509  WBC 11.4*  HGB 11.4*  HCT 34.1*  MCV 85.5  PLT 357   CBG:  Recent Labs Lab 05/15/15 2105 05/16/15 0631 05/16/15 1106 05/16/15 1701 05/16/15 2155 05/17/15 0605  GLUCAP 203* 223* 264* 305* 186* 151*    Medications: I have reviewed the patient's current medications. Scheduled Meds: . budesonide-formoterol  2 puff Inhalation BID  . carvedilol  6.25 mg Oral BID WC  . citalopram  30 mg Oral Daily  . diclofenac sodium  4 g Topical QID  . enoxaparin (LOVENOX) injection  40 mg Subcutaneous Q24H  . losartan  100 mg Oral Daily   And  . hydrochlorothiazide  25 mg Oral Daily  . insulin  aspart  0-15 Units Subcutaneous TID WC  . insulin aspart  0-5 Units Subcutaneous QHS  . ipratropium-albuterol  3 mL Nebulization Q6H  . pravastatin  40 mg Oral Daily  . predniSONE  40 mg Oral Q breakfast  . sodium chloride  3 mL Intravenous Q12H   Continuous Infusions:  PRN Meds:.sodium chloride, albuterol, guaiFENesin-codeine, HYDROcodone-acetaminophen, LORazepam, sodium chloride, zolpidem   Assessment/Plan: Ms. SIDDALEE VANDERHEIDEN is a 64 y.o. female w/ PMHx of HTN, HLD, COPD (no previous PFT's), vocal cord dysfunction, h/o right-sided breast CA in remission, GERD, and DM type II, admitted for COPD exacerbation.   COPD Exacerbation: Breathing appears improved. Still audible upper airway wheeze on exam. Appears comfortable on room air.  -Continue Prednisone 40 mg daily -Continue Duoneb -Continue Symbicort -Continue Robitussin AC for cough -Ativan 0.5 mg q6h prn for anxiety -Voltaren gel + Vicodin prn for rib pain  DM type II: CBG's elevated yesterday, most likely related to steroids. Improved this AM. Trend as follows:   Recent Labs Lab 05/16/15 0631 05/16/15 1106 05/16/15 1701 05/16/15 2155 05/17/15 0605  GLUCAP 223* 264* 305* 186* 151*  -ISS-M + HS coverage  HTN: Stable.  -Continue Cozaar + HCTZ  HLD: Stable. -Pravachol  DVT/PE PPx: Lovenox [place]  Dispo: Disposition is deferred at this time, awaiting improvement of current medical problems.  Anticipated discharge  tomorrow.   The patient does have a current PCP (Adara Kittle Bales, MD) and does need an [facility] hospital follow-up appointment after discharge.  The patient does have transportation limitations that hinder transportation to clinic appointments.  .Services Needed at time of discharge: Y = Yes, Blank = No PT:   OT:   RN:   Equipment:   Other:     LOS: 2 days   Corky Sox, MD 05/17/2015, 8:13 AM

## 2015-05-17 NOTE — Progress Notes (Signed)
Patient DC'd home via car with family.  DC instructions and prescriptions given to patient.  Both fully understood.  Vital signs and assessments were stable.  

## 2015-05-17 NOTE — Progress Notes (Signed)
SATURATION QUALIFICATIONS: (This note is used to comply with regulatory documentation for home oxygen)  Patient Saturations on Room Air at Rest = 100%  Patient Saturations on Room Air while Ambulating = 100%  Patient Saturations on 0 Liters of oxygen while Ambulating = 100%  Please briefly explain why patient needs home oxygen: Patient ambulated without oxygen

## 2015-05-17 NOTE — Discharge Summary (Signed)
Name: [name] MRN: [id number] DOB: 1951-10-16 64 y.o. PCP: Marrian Salvage, MD  Date of Admission: 05/15/2015  4:12 AM Date of Discharge: 05/17/2015 Attending Physician: Earl Lagos, MD  Discharge Diagnosis:  Principal Problem:   COPD exacerbation Active Problems:   DM2 (diabetes mellitus, type 2)   TOBACCO ABUSE   Hypertension, benign essential, goal below 140/90   Financial difficulties  Discharge Medications:   Medication List    STOP taking these medications        clindamycin 150 MG capsule  Commonly known as:  CLEOCIN     ibuprofen 800 MG tablet  Commonly known as:  ADVIL,MOTRIN      TAKE these medications        ACCU-CHEK FASTCLIX LANCETS Misc  1 each by Other route See admin instructions. Check blood sugar daily as needed for high blood sugar.     ACCU-CHEK NANO SMARTVIEW W/DEVICE Kit  1 each by Other route See admin instructions. Check blood sugar daily as needed for high blood sugar.     albuterol 108 (90 BASE) MCG/ACT inhaler  Commonly known as:  VENTOLIN HFA  Inhale 2 puffs into the lungs every 6 (six) hours as needed for wheezing or shortness of breath.     albuterol (2.5 MG/3ML) 0.083% nebulizer solution  Commonly known as:  PROVENTIL  Take 3 mLs (2.5 mg total) by nebulization every 6 (six) hours as needed for wheezing or shortness of breath.     budesonide-formoterol 160-4.5 MCG/ACT inhaler  Commonly known as:  SYMBICORT  Inhale 2 puffs into the lungs 2 (two) times daily.     carvedilol 6.25 MG tablet  Commonly known as:  COREG  take 1 tablet by mouth twice a day with meals     citalopram 20 MG tablet  Commonly known as:  CELEXA  Take 1.5 tablets (30 mg total) by mouth daily.     cyclobenzaprine 5 MG tablet  Commonly known as:  FLEXERIL  Take 1 tablet (5 mg total) by mouth at bedtime as needed for muscle spasms.     fluticasone 50 MCG/ACT nasal spray  Commonly known as:  FLONASE  USE 2 SPRAYS IN EACH NOSTRIL EVERY DAY AS  NEEDED FOR ALLERGIES     glucose blood test strip  Commonly known as:  ACCU-CHEK SMARTVIEW  Use to check blood sugar 1 to 2 times daily. diag code E 11.9. Non- insulin dependent     guaiFENesin-codeine 100-10 MG/5ML syrup  Take 5 mLs by mouth every 4 (four) hours as needed for cough.     losartan-hydrochlorothiazide 100-25 MG per tablet  Commonly known as:  HYZAAR  Take 1 tablet by mouth daily.     metFORMIN 1000 MG tablet  Commonly known as:  GLUCOPHAGE  Take 1 tablet (1,000 mg total) by mouth 2 (two) times daily with a meal.     pravastatin 40 MG tablet  Commonly known as:  PRAVACHOL  TAKE 1 TABLET BY MOUTH DAILY     predniSONE 20 MG tablet  Commonly known as:  DELTASONE  Take 2 tablets (40 mg total) by mouth daily with breakfast.     sodium chloride 0.65 % Soln nasal spray  Commonly known as:  OCEAN  Place 2 sprays into both nostrils 3 (three) times daily.     tiotropium 18 MCG inhalation capsule  Commonly known as:  SPIRIVA  Place 18 mcg into inhaler and inhale daily.        Disposition and  follow-up:   [name] was discharged from [facility] in Good condition.  At the hospital follow up visit please address:  1.  COPD: Patient needs PFT's. Suspect most of her breathing issues are related to upper airway issues. Has she improved since her discharge? Did she finish her course of steroids?  DM type II: Elevated CBG's during admission 2/2 steroids.   2.  Labs / imaging needed at time of follow-up: PFT's  3.  Pending labs/ test needing follow-up: NONE  Follow-up Appointments:     Follow-up Information    Follow up with Deneise Lever, MD On 05/23/2015.   Specialty:  Internal Medicine   Why:  9:30 AM   Contact information:   [address] [place] [address] [phone]       Discharge Instructions:   Consultations:    Procedures Performed:  Dg Chest 2 View (if Patient Has Fever And/or Copd)  05/15/2015   CLINICAL  DATA:  Shortness of breath and cough  EXAM: CHEST  2 VIEW  COMPARISON:  05/08/2015  FINDINGS: Chronic hyperinflation and interstitial coarsening consistent with COPD. There is no edema, consolidation, effusion, or pneumothorax. Normal heart size and stable aortic contours.  Remote right axillary dissection.  Lower cervical discectomy.  IMPRESSION: COPD without acute superimposed disease.   Electronically Signed   By: [name] M.D.   On: 05/15/2015 06:00   Dg Chest 2 View  05/08/2015   CLINICAL DATA:  64 year old female with shortness of breath and productive cough  EXAM: CHEST  2 VIEW  COMPARISON:  Radiograph dated 04/22/2015  FINDINGS: Mild emphysema. No focal consolidation. No pleural effusion or pneumothorax. Cervical spine fixation plate and screws. Right axillary surgical clips.  IMPRESSION: No active cardiopulmonary disease.  No interval change.   Electronically Signed   By: Elgie Collard M.D.   On: 05/08/2015 23:47   Dg Chest 2 View  04/22/2015   CLINICAL DATA:  Cough and shortness of breath. Recent sinus infection.  EXAM: CHEST  2 VIEW  COMPARISON:  12/30/2014  FINDINGS: Hyperinflation suggesting emphysema. No focal airspace disease or consolidation. No blunting of costophrenic angles. No pneumothorax. Normal heart size and pulmonary vascularity. Old right rib fractures. Surgical clips in the right axilla. Postoperative changes in the cervical spine.  IMPRESSION: Emphysematous changes in the lungs. No evidence of active pulmonary disease.   Electronically Signed   By: Burman Nieves M.D.   On: 04/22/2015 06:08    Admission HPI: [name] is a 64 year old female with an extensive PMH which includes COPD with frequent exacerbations, T2DM, HTN, HLD, and breast CA s/p lumpectomy with chemo/radiation in 1991. She presented to the ED 1 week ago with complaints of dyspnea and was treated with a 5 day course of prednisone and instructed to follow up with her PCP. She reports that she  took the last dose of prednisone yesterday and her respiratory status had not improved so she returned to the ED for evaluation. In the ED she was noted to have wheezing on exam that persisted despite 2x albuterol nebulizer treatments and IMTS was consulted for admission. She currently reports that she remains SOB. She reports associated symptoms of non productive cough, some right side rib pain (attributes to cough), nasal congestion and drainage. She reports these symptoms have been ongoing for over a week, were not much improved with the prednisone at home or her chronic COPD medications. She does continue to smoke cigarettes and  notes that she is down to about 3 cigs a day, her husband and daughter also smoke in the house. She denies any sick contacts, denies any fever or chills.  She has had frequent COPD exacerbations over the past year both treated in the inpatient and outpatient setting. Of concern is that in November 2015 she had vocal cord dysfunction and ultimately was intubated and had a short stay in the ICU. She was instructed to follow up with ENT after this episode but it does not appear that she has done that. In her frequent COPD exacerbations she has been told she needs PFTS and this have been ordered as well as requested by her PCP but she has never had them. Her COPD diagnosis appears to come from her clinical presentations as well as consistent CXR findings.  She does report that she takes Symbicort (BID) and Albuterol at home, she does not take spirvia as she reports it is too expensive.  Hospital Course by problem list: Principal Problem:   COPD exacerbation Active Problems:   DM2 (diabetes mellitus, type 2)   TOBACCO ABUSE   Hypertension, benign essential, goal below 140/90   Financial difficulties   1. COPD Exacerbation: Patient admitted w/ SOB, dry cough, and wheezing. Given Solu-Medrol 125 mg in ED and then changed to 80 mg bid. Given breathing treatments, Rx for  Spiriva for discharge. No ABx prescribed. Changed to Prednisone 40 mg daily for 5 day course. Breathing significantly improved prior to discharge.   Discharge Vitals:   BP 138/60 mmHg  Pulse 101  Temp(Src) 99.7 F (37.6 C) (Oral)  Resp 18  Ht 5\' 7"  (1.702 m)  Wt 223 lb (101.152 kg)  BMI 34.92 kg/m2  SpO2 100%  Discharge Labs:  Results for orders placed or performed during the hospital encounter of 05/15/15 (from the past 24 hour(s))  Glucose, capillary     Status: Abnormal   Collection Time: 05/16/15  5:01 PM  Result Value Ref Range   Glucose-Capillary 305 (H) 65 - 99 mg/dL  Glucose, capillary     Status: Abnormal   Collection Time: 05/16/15  9:55 PM  Result Value Ref Range   Glucose-Capillary 186 (H) 65 - 99 mg/dL   Comment 1 Notify RN    Comment 2 Document in Chart   Glucose, capillary     Status: Abnormal   Collection Time: 05/17/15  6:05 AM  Result Value Ref Range   Glucose-Capillary 151 (H) 65 - 99 mg/dL  Glucose, capillary     Status: Abnormal   Collection Time: 05/17/15 11:07 AM  Result Value Ref Range   Glucose-Capillary 208 (H) 65 - 99 mg/dL   Comment 1 Notify RN    Comment 2 Document in Chart     Signed: Courtney Paris, MD 05/17/2015, 4:17 PM    Services Ordered on Discharge: none Equipment Ordered on Discharge: none

## 2015-05-21 ENCOUNTER — Telehealth: Payer: Self-pay | Admitting: Licensed Clinical Social Worker

## 2015-05-21 NOTE — Telephone Encounter (Signed)
[name] was referred to CSW as pt has canceled two appointments for lack of transportation.  Pt has an [facility] appointment scheduled for this Friday 05/23/15.  CSW placed called to pt.  CSW left message requesting return call. CSW provided contact hours and phone number.  Pt was linked with [facility] in 08/2014, checking to care manager to confirm if pt is still active or if d/c with resources.

## 2015-05-23 ENCOUNTER — Encounter: Payer: Self-pay | Admitting: Internal Medicine

## 2015-05-23 ENCOUNTER — Encounter: Payer: Self-pay | Admitting: Licensed Clinical Social Worker

## 2015-05-23 ENCOUNTER — Ambulatory Visit (INDEPENDENT_AMBULATORY_CARE_PROVIDER_SITE_OTHER): Payer: Medicare Other | Admitting: Internal Medicine

## 2015-05-23 VITALS — BP 130/59 | HR 108 | Temp 98.5°F | Ht 67.0 in | Wt 216.3 lb

## 2015-05-23 DIAGNOSIS — J449 Chronic obstructive pulmonary disease, unspecified: Secondary | ICD-10-CM

## 2015-05-23 DIAGNOSIS — Z09 Encounter for follow-up examination after completed treatment for conditions other than malignant neoplasm: Secondary | ICD-10-CM | POA: Diagnosis not present

## 2015-05-23 DIAGNOSIS — F17210 Nicotine dependence, cigarettes, uncomplicated: Secondary | ICD-10-CM | POA: Diagnosis not present

## 2015-05-23 DIAGNOSIS — M25561 Pain in right knee: Secondary | ICD-10-CM

## 2015-05-23 DIAGNOSIS — J111 Influenza due to unidentified influenza virus with other respiratory manifestations: Secondary | ICD-10-CM

## 2015-05-23 LAB — GLUCOSE, CAPILLARY: Glucose-Capillary: 193 mg/dL — ABNORMAL HIGH (ref 65–99)

## 2015-05-23 MED ORDER — CYCLOBENZAPRINE HCL 5 MG PO TABS: 5.0000 mg | ORAL_TABLET | Freq: Every evening | ORAL | Status: DC | PRN

## 2015-05-23 NOTE — Progress Notes (Signed)
Patient ID: [name], female   DOB: 1951/02/28, 64 y.o.   MRN: [id number]   Subjective:   [name]   DOB: December 02, 1950 64 y.o.   MRN: [id number]  HPI: [name] is a 64 y.o. female with PMH as listed below who is visiting [place] today for a hospital visit follow. She also has complaints of right knee pain.  Please see problem list for discussion.   Past Medical History  Diagnosis Date  . Hyperlipidemia   . Hypertension   . Tobacco abuse   . COPD (chronic obstructive pulmonary disease)     History of multiple hospital admissions for exercabation   . Asthma   . Breast cancer 1991    s/p lumpectomy, chemotherapy and radiation therapy in 1991. Mammogram in 2007 was normal.  . Sigmoid diverticulitis 80/2008  . Anxiety   . Depression   . Obesity   . GERD (gastroesophageal reflux disease)   . Heart murmur 10/05/11    "first time I ever heard I had one was today"  . Pneumonia   . Shortness of breath 10/05/11    "at rest; lying down; w/exertion"  . Diabetes mellitus   . Bronchitis     h/o  . Diarrhea     h/o  . Constipated     h/o  . COPD with exacerbation 04/06/2009    Qualifier: Diagnosis of  By: Eyvonne Mechanic MD, Vijay     Current Outpatient Prescriptions  Medication Sig Dispense Refill  . ACCU-CHEK FASTCLIX LANCETS MISC 1 each by Other route See admin instructions. Check blood sugar daily as needed for high blood sugar.    . albuterol (PROVENTIL) (2.5 MG/3ML) 0.083% nebulizer solution Take 3 mLs (2.5 mg total) by nebulization every 6 (six) hours as needed for wheezing or shortness of breath. 360 mL 3  . albuterol (VENTOLIN HFA) 108 (90 BASE) MCG/ACT inhaler Inhale 2 puffs into the lungs every 6 (six) hours as needed for wheezing or shortness of breath. 1 Inhaler 3  . Blood Glucose Monitoring Suppl (ACCU-CHEK NANO SMARTVIEW) W/DEVICE KIT 1 each by Other route See admin instructions. Check blood sugar daily as needed for high blood sugar.      . budesonide-formoterol (SYMBICORT) 160-4.5 MCG/ACT inhaler Inhale 2 puffs into the lungs 2 (two) times daily. 1 Inhaler 12  . carvedilol (COREG) 6.25 MG tablet take 1 tablet by mouth twice a day with meals 60 tablet 3  . citalopram (CELEXA) 20 MG tablet Take 1.5 tablets (30 mg total) by mouth daily. 45 tablet 3  . cyclobenzaprine (FLEXERIL) 5 MG tablet Take 1 tablet (5 mg total) by mouth at bedtime as needed for muscle spasms. 15 tablet 0  . glucose blood (ACCU-CHEK SMARTVIEW) test strip Use to check blood sugar 1 to 2 times daily. diag code E 11.9. Non- insulin dependent 100 each 6  . guaiFENesin-codeine 100-10 MG/5ML syrup Take 5 mLs by mouth every 4 (four) hours as needed for cough. 120 mL 0  . losartan-hydrochlorothiazide (HYZAAR) 100-25 MG per tablet Take 1 tablet by mouth daily. 30 tablet 2  . metFORMIN (GLUCOPHAGE) 1000 MG tablet Take 1 tablet (1,000 mg total) by mouth 2 (two) times daily with a meal. 60 tablet 11  . pravastatin (PRAVACHOL) 40 MG tablet TAKE 1 TABLET BY MOUTH DAILY 90 tablet 0  . predniSONE (DELTASONE) 20 MG tablet Take 2 tablets (40 mg total) by mouth daily with breakfast. 8 tablet 0  . sodium chloride (OCEAN)  0.65 % SOLN nasal spray Place 2 sprays into both nostrils 3 (three) times daily. 15 mL 3  . tiotropium (SPIRIVA) 18 MCG inhalation capsule Place 18 mcg into inhaler and inhale daily.    . [DISCONTINUED] albuterol (PROVENTIL,VENTOLIN) 90 MCG/ACT inhaler Inhale 2 puffs into the lungs every 6 (six) hours as needed for wheezing. 17 g 12   No current facility-administered medications for this visit.   Family History  Problem Relation Age of Onset  . Cancer Mother    History   Social History  . Marital Status: Married    Spouse Name: N/A  . Number of Children: N/A  . Years of Education: 12   Occupational History  .  Unemployed   Social History Main Topics  . Smoking status: Current Some Day Smoker -- 0.10 packs/day for 40 years    Types: Cigarettes     Last Attempt to Quit: 09/11/2014  . Smokeless tobacco: Never Used     Comment: slowly quitting.DOWN TO ABOUT 6 CIGARETTES PER DAY  . Alcohol Use: 2.4 oz/week    4 Cans of beer per week     Comment: "only on the weekends"  . Drug Use: No  . Sexual Activity: Not on file   Other Topics Concern  . None   Social History Narrative   Lives in [place] with her husband.   Takes care of 3 grand children.   Trying to find a job, has financial difficulties.         Review of Systems: Review of Systems  Constitutional: Negative for fever and chills.  HENT: Negative for congestion, ear pain and sore throat.   Eyes: Negative for blurred vision.  Respiratory: Negative for cough, shortness of breath and wheezing.   Cardiovascular: Negative for chest pain, palpitations and leg swelling.  Gastrointestinal: Negative for heartburn, nausea, vomiting, abdominal pain, diarrhea, constipation and blood in stool.  Genitourinary: Negative for dysuria, urgency, frequency and hematuria.  Musculoskeletal: Positive for joint pain. Negative for falls.  Skin: Negative for itching and rash.  Neurological: Negative for dizziness, weakness and headaches.     Objective:  Physical Exam: Filed Vitals:   05/23/15 1439  BP: 130/59  Pulse: 108  Temp: 98.5 F (36.9 C)  TempSrc: Oral  Height: '5\' 7"'  (1.702 m)  Weight: 216 lb 4.8 oz (98.113 kg)  SpO2: 100%   Physical Exam  Constitutional: She is oriented to person, place, and time. She appears well-developed and well-nourished.  HENT:  Head: Normocephalic and atraumatic.  Eyes: Conjunctivae and EOM are normal.  Neck: Normal range of motion.  Cardiovascular: Regular rhythm and normal heart sounds.  Tachycardia present.   Pulmonary/Chest: Effort normal and breath sounds normal. No respiratory distress. She has no wheezes. She has no rales. She exhibits no tenderness.  Abdominal: Soft. Bowel sounds are normal. There is no tenderness.  Musculoskeletal:        Right knee: She exhibits decreased range of motion. She exhibits no swelling. Tenderness found.       Left knee: Normal.  Neurological: She is alert and oriented to person, place, and time.  Skin: Skin is warm.  Psychiatric: She has a normal mood and affect.     Assessment & Plan:  Please see problem based charting for current assessment and plan.

## 2015-05-23 NOTE — Assessment & Plan Note (Signed)
Patient was discharged from hospital on 05/15/2015 for COPD exacerbation and acute sinusitis. 1 month ago she felt symptoms of full sinuses, congestion, rhinorrhea, difficulty breathing, cough productive of clear sputum, and generally feeling unwell. She denies any sick contacts. She states that she has episode like this about 4-5 times a year. She had a short course of antibiotics and prednisone.  Patient states that she is feeling much better today and is feeling more like her regular self. Patient does state that she does feel confused about what medications to take when they are changed every so often.  Patient is improved, no changes to current medications made. Will refer to Dr. Maudie Mercury for medicine management.

## 2015-05-23 NOTE — Telephone Encounter (Signed)
CSW placed called to pt.  CSW left message stating information on SC[employer] and Senior Wheels will be left with [facility] to be provided to pt at appointment today.

## 2015-05-23 NOTE — Patient Instructions (Signed)
Thank you for visiting [place] today.  I am refilling your flexeril today.  For your knee pain, try over the counter Tylenol and Ibuprofen. Do not exceed 4 grams of Tylenol in a day or 3.2 grams of Ibuprofen. Also try heat packs, ice, rest, and walking as tolerated.  We can refer you to our pharmacist to discuss your medications as well.  I am glad you are feeling better!   Osteoarthritis Osteoarthritis is a disease that causes soreness and inflammation of a joint. It occurs when the cartilage at the affected joint wears down. Cartilage acts as a cushion, covering the ends of bones where they meet to form a joint. Osteoarthritis is the most common form of arthritis. It often occurs in older people. The joints affected most often by this condition include those in the:  Ends of the fingers.  Thumbs.  Neck.  Lower back.  Knees.  Hips. CAUSES  Over time, the cartilage that covers the ends of bones begins to wear away. This causes bone to rub on bone, producing pain and stiffness in the affected joints.  RISK FACTORS Certain factors can increase your chances of having osteoarthritis, including:  Older age.  Excessive body weight.  Overuse of joints.  Previous joint injury. SIGNS AND SYMPTOMS   Pain, swelling, and stiffness in the joint.  Over time, the joint may lose its normal shape.  Small deposits of bone (osteophytes) may grow on the edges of the joint.  Bits of bone or cartilage can break off and float inside the joint space. This may cause more pain and damage. DIAGNOSIS  Your health care provider will do a physical exam and ask about your symptoms. Various tests may be ordered, such as:  X-rays of the affected joint.  An MRI scan.  Blood tests to rule out other types of arthritis.  Joint fluid tests. This involves using a needle to draw fluid from the joint and examining the fluid under a microscope. TREATMENT  Goals of treatment are to control pain and improve  joint function. Treatment plans may include:  A prescribed exercise program that allows for rest and joint relief.  A weight control plan.  Pain relief techniques, such as:  Properly applied heat and cold.  Electric pulses delivered to nerve endings under the skin (transcutaneous electrical nerve stimulation [TENS]).  Massage.  Certain nutritional supplements.  Medicines to control pain, such as:  Acetaminophen.  Nonsteroidal anti-inflammatory drugs (NSAIDs), such as naproxen.  Narcotic or central-acting agents, such as tramadol.  Corticosteroids. These can be given orally or as an injection.  Surgery to reposition the bones and relieve pain (osteotomy) or to remove loose pieces of bone and cartilage. Joint replacement may be needed in advanced states of osteoarthritis. HOME CARE INSTRUCTIONS   Take medicines only as directed by your health care provider.  Maintain a healthy weight. Follow your health care provider's instructions for weight control. This may include dietary instructions.  Exercise as directed. Your health care provider can recommend specific types of exercise. These may include:  Strengthening exercises. These are done to strengthen the muscles that support joints affected by arthritis. They can be performed with weights or with exercise bands to add resistance.  Aerobic activities. These are exercises, such as brisk walking or low-impact aerobics, that get your heart pumping.  Range-of-motion activities. These keep your joints limber.  Balance and agility exercises. These help you maintain daily living skills.  Rest your affected joints as directed by your health  care provider.  Keep all follow-up visits as directed by your health care provider. SEEK MEDICAL CARE IF:   Your skin turns red.  You develop a rash in addition to your joint pain.  You have worsening joint pain.  You have a fever along with joint or muscle aches. SEEK IMMEDIATE  MEDICAL CARE IF:  You have a significant loss of weight or appetite.  You have night sweats. [place] of Arthritis and Musculoskeletal and Skin Diseases: www.niams.SouthExposed.es  [employer] on Aging: http://kim-miller.com/  American College of Rheumatology: www.rheumatology.org Document Released: 10/25/2005 Document Revised: 03/11/2014 Document Reviewed: 07/02/2013 [facility] Patient Information 2015 [place], [place]. This information is not intended to replace advice given to you by your health care provider. Make sure you discuss any questions you have with your health care provider.

## 2015-05-23 NOTE — Assessment & Plan Note (Signed)
Patient has right knee pain for the last week. She states that she awoke one morning with the pain which last for about 10 minutes before improving. She states that the pain is not constant but happens every few days and has had episodes before. She had an x-ray of the right knee in 2013 which showed degenerative changes. She has tried Ibuprofen without relief. She takes Flexeril for muscle spasms and states she needs a refill. She denies any fever, chills, or accidents/injury.  This seems likely to be an osteoarthritic issue. -Patient is advised to try OTC Tylenol and Ibuprofen, not to exceed maximum daily doses. Also try heat packs, icing the knee, rest, and walk as tolerated. -Patient is provided with information about osteoarthritis. -Will refill Flexeril 5 mg at bedtime.

## 2015-05-26 ENCOUNTER — Other Ambulatory Visit: Payer: Self-pay | Admitting: Internal Medicine

## 2015-05-28 NOTE — Progress Notes (Signed)
I saw and evaluated the patient.  I personally confirmed the key portions of the history and exam documented by Dr. Patel and I reviewed pertinent patient test results.  The assessment, diagnosis, and plan were formulated together and I agree with the documentation in the resident's note. 

## 2015-05-28 NOTE — Addendum Note (Signed)
Addended by: Hulan Fray on: 05/28/2015 06:54 PM   Modules accepted: Orders

## 2015-06-03 ENCOUNTER — Emergency Department (HOSPITAL_COMMUNITY): Payer: Medicare Other

## 2015-06-03 ENCOUNTER — Emergency Department (HOSPITAL_COMMUNITY)
Admission: EM | Admit: 2015-06-03 | Discharge: 2015-06-04 | Disposition: A | Discharge status: Home / Self Care | Payer: Medicare Other | Attending: Emergency Medicine | Admitting: Emergency Medicine

## 2015-06-03 ENCOUNTER — Encounter (HOSPITAL_COMMUNITY): Payer: Self-pay | Admitting: Emergency Medicine

## 2015-06-03 DIAGNOSIS — F419 Anxiety disorder, unspecified: Secondary | ICD-10-CM | POA: Insufficient documentation

## 2015-06-03 DIAGNOSIS — Z853 Personal history of malignant neoplasm of breast: Secondary | ICD-10-CM | POA: Insufficient documentation

## 2015-06-03 DIAGNOSIS — Z8701 Personal history of pneumonia (recurrent): Secondary | ICD-10-CM | POA: Insufficient documentation

## 2015-06-03 DIAGNOSIS — I10 Essential (primary) hypertension: Secondary | ICD-10-CM | POA: Insufficient documentation

## 2015-06-03 DIAGNOSIS — R011 Cardiac murmur, unspecified: Secondary | ICD-10-CM | POA: Diagnosis not present

## 2015-06-03 DIAGNOSIS — J441 Chronic obstructive pulmonary disease with (acute) exacerbation: Secondary | ICD-10-CM | POA: Insufficient documentation

## 2015-06-03 DIAGNOSIS — Z79899 Other long term (current) drug therapy: Secondary | ICD-10-CM | POA: Diagnosis not present

## 2015-06-03 DIAGNOSIS — F329 Major depressive disorder, single episode, unspecified: Secondary | ICD-10-CM | POA: Diagnosis not present

## 2015-06-03 DIAGNOSIS — E119 Type 2 diabetes mellitus without complications: Secondary | ICD-10-CM | POA: Diagnosis not present

## 2015-06-03 DIAGNOSIS — J449 Chronic obstructive pulmonary disease, unspecified: Secondary | ICD-10-CM

## 2015-06-03 DIAGNOSIS — Z8719 Personal history of other diseases of the digestive system: Secondary | ICD-10-CM | POA: Insufficient documentation

## 2015-06-03 DIAGNOSIS — Z720 Tobacco use: Secondary | ICD-10-CM | POA: Diagnosis not present

## 2015-06-03 DIAGNOSIS — R0602 Shortness of breath: Secondary | ICD-10-CM | POA: Diagnosis not present

## 2015-06-03 DIAGNOSIS — F17200 Nicotine dependence, unspecified, uncomplicated: Secondary | ICD-10-CM | POA: Diagnosis not present

## 2015-06-03 DIAGNOSIS — R079 Chest pain, unspecified: Secondary | ICD-10-CM | POA: Diagnosis not present

## 2015-06-03 DIAGNOSIS — E780 Pure hypercholesterolemia: Secondary | ICD-10-CM | POA: Diagnosis not present

## 2015-06-03 LAB — CBC WITH DIFFERENTIAL/PLATELET
Basophils Absolute: 0 10*3/uL (ref 0.0–0.1)
Basophils Relative: 0 % (ref 0–1)
Eosinophils Absolute: 0.2 10*3/uL (ref 0.0–0.7)
Eosinophils Relative: 3 % (ref 0–5)
HCT: 37.1 % (ref 36.0–46.0)
Hemoglobin: 12.4 g/dL (ref 12.0–15.0)
Lymphocytes Relative: 24 % (ref 12–46)
Lymphs Abs: 1.9 10*3/uL (ref 0.7–4.0)
MCH: 28.9 pg (ref 26.0–34.0)
MCHC: 33.4 g/dL (ref 30.0–36.0)
MCV: 86.5 fL (ref 78.0–100.0)
Monocytes Absolute: 0.4 10*3/uL (ref 0.1–1.0)
Monocytes Relative: 6 % (ref 3–12)
Neutro Abs: 5.5 10*3/uL (ref 1.7–7.7)
Neutrophils Relative %: 67 % (ref 43–77)
Platelets: 272 10*3/uL (ref 150–400)
RBC: 4.29 MIL/uL (ref 3.87–5.11)
RDW: 13.6 % (ref 11.5–15.5)
WBC: 8.1 10*3/uL (ref 4.0–10.5)

## 2015-06-03 LAB — BASIC METABOLIC PANEL
Anion gap: 13 (ref 5–15)
BUN: 6 mg/dL (ref 6–20)
CO2: 26 mmol/L (ref 22–32)
Calcium: 9.1 mg/dL (ref 8.9–10.3)
Chloride: 96 mmol/L — ABNORMAL LOW (ref 101–111)
Creatinine, Ser: 0.95 mg/dL (ref 0.44–1.00)
GFR calc Af Amer: >60 mL/min (ref >60)
GFR calc non Af Amer: >60 mL/min (ref >60)
Glucose, Bld: 122 mg/dL — ABNORMAL HIGH (ref 65–99)
Potassium: 3.9 mmol/L (ref 3.5–5.1)
Sodium: 135 mmol/L (ref 135–145)

## 2015-06-03 MED ORDER — METHYLPREDNISOLONE SODIUM SUCC 125 MG IJ SOLR
125.0000 mg | Freq: Once | INTRAMUSCULAR | Status: AC
  Administered 2015-06-03: 125 mg via INTRAVENOUS
  Filled 2015-06-03: qty 2

## 2015-06-03 MED ORDER — IPRATROPIUM-ALBUTEROL 0.5-2.5 (3) MG/3ML IN SOLN
3.0000 mL | Freq: Once | RESPIRATORY_TRACT | Status: AC
  Administered 2015-06-03: 3 mL via RESPIRATORY_TRACT
  Filled 2015-06-03: qty 3

## 2015-06-03 MED ORDER — SODIUM CHLORIDE 0.9 % IV BOLUS (SEPSIS): 500.0000 mL | Freq: Once | INTRAVENOUS | Status: DC

## 2015-06-03 MED ORDER — ACETAMINOPHEN 325 MG PO TABS
325.0000 mg | ORAL_TABLET | Freq: Once | ORAL | Status: AC
  Administered 2015-06-03: 325 mg via ORAL
  Filled 2015-06-03: qty 1

## 2015-06-03 MED ORDER — ALBUTEROL SULFATE (2.5 MG/3ML) 0.083% IN NEBU
2.5000 mg | INHALATION_SOLUTION | Freq: Once | RESPIRATORY_TRACT | Status: AC
  Administered 2015-06-03: 2.5 mg via RESPIRATORY_TRACT
  Filled 2015-06-03: qty 3

## 2015-06-03 MED ORDER — ALBUTEROL SULFATE (2.5 MG/3ML) 0.083% IN NEBU
5.0000 mg | INHALATION_SOLUTION | Freq: Once | RESPIRATORY_TRACT | Status: AC
  Administered 2015-06-03: 5 mg via RESPIRATORY_TRACT
  Filled 2015-06-03: qty 6

## 2015-06-03 NOTE — ED Notes (Signed)
States has been short of breath since this morning, pt has hx COPD--lungs diminished throughout

## 2015-06-03 NOTE — ED Notes (Signed)
Patient transported to X-ray 

## 2015-06-03 NOTE — ED Provider Notes (Signed)
CSN: [id number]     Arrival date & time 06/03/15  1837 History   First MD Initiated Contact with Patient 06/03/15 1903     Chief Complaint  Patient presents with  . Shortness of Breath   HPI   64 year old female presents today with shortness of breath. Patient has a significant past medical history of COPD and asthma, continues to smoke, reports that discharge from the hospital on 05/15/2015 for a COPD exacerbation and acute sinusitis. Patient reports that after discharge she still experienced some symptoms, but was improved from initial presentation. She reports that over the last couple of days patient has had increasing shortness of breath, with the addition of a nonproductive cough today, rhinorrhea, watery eyes.. Patient reports that she has tried her at-home albuterol with no improvement in symptoms. Patient reports that she has history of allergies but was not prescribed Claritin so she has not been taking it.    Past Medical History  Diagnosis Date  . Hyperlipidemia   . Hypertension   . Tobacco abuse   . COPD (chronic obstructive pulmonary disease)     History of multiple hospital admissions for exercabation   . Asthma   . Breast cancer 1991    s/p lumpectomy, chemotherapy and radiation therapy in 1991. Mammogram in 2007 was normal.  . Sigmoid diverticulitis 80/2008  . Anxiety   . Depression   . Obesity   . GERD (gastroesophageal reflux disease)   . Heart murmur 10/05/11    "first time I ever heard I had one was today"  . Pneumonia   . Shortness of breath 10/05/11    "at rest; lying down; w/exertion"  . Diabetes mellitus   . Bronchitis     h/o  . Diarrhea     h/o  . Constipated     h/o  . COPD with exacerbation 04/06/2009    Qualifier: Diagnosis of  By: Eyvonne Mechanic MD, Doyne Keel     Past Surgical History  Procedure Laterality Date  . Dobutamine stress echo  08/2004    Inferior ischemia, normal LV systolic function, no significant CAD  . Abdominal hysterectomy    . Breast  surgery  1991    lumphectomy right breast  . Neck surgery  2012    "Dr. Lynann Bologna  put plate in; did something to my vertebrae"   Family History  Problem Relation Age of Onset  . Cancer Mother    History  Substance Use Topics  . Smoking status: Current Some Day Smoker -- 0.10 packs/day for 40 years    Types: Cigarettes    Last Attempt to Quit: 09/11/2014  . Smokeless tobacco: Never Used     Comment: slowly quitting.DOWN TO ABOUT 6 CIGARETTES PER DAY  . Alcohol Use: 2.4 oz/week    4 Cans of beer per week     Comment: "only on the weekends"   OB History    No data available     Review of Systems  All other systems reviewed and are negative.   Allergies  Ace inhibitors and Flonase  Home Medications   Prior to Admission medications   Medication Sig Start Date End Date Taking? Authorizing Provider  acetaminophen (TYLENOL) 325 MG tablet Take 650 mg by mouth 2 (two) times daily as needed for headache.   Yes Historical Provider, MD  albuterol (PROVENTIL) (2.5 MG/3ML) 0.083% nebulizer solution Take 3 mLs (2.5 mg total) by nebulization every 6 (six) hours as needed for wheezing or shortness of breath. 03/06/15  Yes  Lucious Groves, DO  albuterol (VENTOLIN HFA) 108 (90 BASE) MCG/ACT inhaler Inhale 2 puffs into the lungs every 6 (six) hours as needed for wheezing or shortness of breath. 08/07/14  Yes Jones Bales, MD  budesonide-formoterol (SYMBICORT) 160-4.5 MCG/ACT inhaler Inhale 2 puffs into the lungs 2 (two) times daily. 03/06/15  Yes Lucious Groves, DO  carvedilol (COREG) 6.25 MG tablet take 1 tablet by mouth twice a day with meals 03/13/15  Yes Jones Bales, MD  citalopram (CELEXA) 20 MG tablet Take 1.5 tablets (30 mg total) by mouth daily. 04/08/15  Yes Jones Bales, MD  guaiFENesin-codeine 100-10 MG/5ML syrup Take 5 mLs by mouth every 4 (four) hours as needed for cough. 05/17/15  Yes Corky Sox, MD  losartan-hydrochlorothiazide (HYZAAR) 100-25 MG per tablet Take 1 tablet by  mouth daily. 04/08/15  Yes Jessee Avers, MD  metFORMIN (GLUCOPHAGE) 1000 MG tablet Take 1 tablet (1,000 mg total) by mouth 2 (two) times daily with a meal. 01/03/15  Yes Bartholomew Crews, MD  pravastatin (PRAVACHOL) 40 MG tablet TAKE 1 TABLET BY MOUTH DAILY Patient taking differently: TAKE 1 TABLET BY MOUTH every evening. 04/08/15  Yes Jones Bales, MD  sodium chloride (OCEAN) 0.65 % SOLN nasal spray Place 2 sprays into both nostrils 3 (three) times daily. 04/14/15  Yes Jones Bales, MD  ACCU-CHEK FASTCLIX LANCETS MISC 1 each by Other route See admin instructions. Check blood sugar daily as needed for high blood sugar.    Historical Provider, MD  Blood Glucose Monitoring Suppl (ACCU-CHEK NANO SMARTVIEW) W/DEVICE KIT 1 each by Other route See admin instructions. Check blood sugar daily as needed for high blood sugar.    Historical Provider, MD  cyclobenzaprine (FLEXERIL) 5 MG tablet Take 1 tablet (5 mg total) by mouth at bedtime as needed for muscle spasms. 05/23/15   Zada Finders, MD  glucose blood (ACCU-CHEK SMARTVIEW) test strip Use to check blood sugar 1 to 2 times daily. diag code E 11.9. Non- insulin dependent 10/02/14   Sid Falcon, MD  predniSONE (DELTASONE) 20 MG tablet Take 2 tablets (40 mg total) by mouth daily with breakfast. Patient not taking: Reported on 06/03/2015 05/17/15   Corky Sox, MD   BP 133/70 mmHg  Pulse 102  Temp(Src) 99.1 F (37.3 C) (Oral)  Resp 22  SpO2 94%   Physical Exam  Constitutional: She is oriented to person, place, and time. She appears well-developed and well-nourished.  HENT:  Head: Normocephalic and atraumatic.  Nose: Mucosal edema and rhinorrhea present.  Eyes: Conjunctivae are normal. Pupils are equal, round, and reactive to light. Right eye exhibits no discharge. Left eye exhibits no discharge. No scleral icterus.  Neck: Normal range of motion. No JVD present. No tracheal deviation present.  Cardiovascular: Regular rhythm, normal heart  sounds and intact distal pulses.  Exam reveals no gallop and no friction rub.   No murmur heard. Pulmonary/Chest: Effort normal. No stridor. No respiratory distress. She has wheezes. She has no rales. She exhibits no tenderness.  Bilateral lower lobe wheeze, no rales  Musculoskeletal:  No lower extremity swelling or edema  Neurological: She is alert and oriented to person, place, and time. Coordination normal.  Psychiatric: She has a normal mood and affect. Her behavior is normal. Judgment and thought content normal.  Nursing note and vitals reviewed.     ED Course  Procedures (including critical care time) Labs Review Labs Reviewed  BASIC METABOLIC PANEL - Abnormal; Notable  for the following:    Chloride 96 (*)    Glucose, Bld 122 (*)    All other components within normal limits  CBC WITH DIFFERENTIAL/PLATELET    Imaging Review Dg Chest 2 View  06/03/2015   CLINICAL DATA:  Shortness of breath, chest pain  EXAM: CHEST - 2 VIEW  COMPARISON:  05/15/2015  FINDINGS: Lungs are clear, hyperinflated. Heart size upper limits normal. The mediastinal contours are within normal limits. No effusion. No pneumothorax. Vascular clips right axilla. Old right rib fractures. Cervical fixation hardware.  Spurring in the mid thoracic spine.  IMPRESSION: No acute cardiopulmonary disease.   Electronically Signed   By: Lucrezia Europe M.D.   On: 06/03/2015 20:03     EKG Interpretation   Date/Time:  Tuesday June 03 2015 18:46:25 EDT Ventricular Rate:  130 PR Interval:  126 QRS Duration: 86 QT Interval:  316 QTC Calculation: 465 R Axis:   87 Text Interpretation:  Sinus tachycardia Right atrial enlargement Pulmonary  disease pattern Nonspecific ST abnormality Abnormal ECG Since last tracing  rate faster Confirmed by KNAPP  MD-J, JON ([id number]) on 06/03/2015 9:04:01 PM      MDM   Final diagnoses:  COPD (chronic obstructive pulmonary disease)    Labs: CBC, BMP- no significant findings  Imaging: DG  chest- no significant findings  Consults:  Therapeutics: Albuterol 10 mg, Atrovent 0.5  Discharge Meds:   Assessment/Plan: Patient with history of COPD asthma presents today with shortness of breath and wheezing. Attempts at home nebulizer unsuccessful, attempts at 3 nebulized treatments with a total of 10 mg albuterol 0.5 Atrovent improved wheezing. Pt reports she has slightly improved but still feels SOB and not to a level she feels comfortable with. She requests hospital admission. Pt has a significant COPD/asth,a history and would benefit from continued treatment in the hospital setting. Internal medicine was consulted for admission. They are familiar with patient and agreed to evaluate for admission.          Okey Regal, PA-C 06/04/15 [phone]

## 2015-06-04 ENCOUNTER — Telehealth: Payer: Self-pay | Admitting: *Deleted

## 2015-06-04 ENCOUNTER — Telehealth (HOSPITAL_COMMUNITY): Payer: Self-pay

## 2015-06-04 MED ORDER — HYDROCHLOROTHIAZIDE 25 MG PO TABS
25.0000 mg | ORAL_TABLET | Freq: Once | ORAL | Status: AC
  Administered 2015-06-04: 25 mg via ORAL
  Filled 2015-06-04: qty 1

## 2015-06-04 MED ORDER — LOSARTAN POTASSIUM 50 MG PO TABS
100.0000 mg | ORAL_TABLET | Freq: Once | ORAL | Status: AC
  Administered 2015-06-04: 100 mg via ORAL
  Filled 2015-06-04: qty 2

## 2015-06-04 NOTE — Telephone Encounter (Signed)
Patient calling to see if Dr Tomi Bamberger or her PCP was supposed to give her and Rx's.  Current Probation officer reviewed ED notes and no mention of Rx.  Transferred caller to IM to see if her MD Dr Marylouise Stacks had planned to give her Rx when she saw her in ED.

## 2015-06-04 NOTE — Telephone Encounter (Signed)
Pt called clinic - saw Dr Gordy Levan saw pt  last PM in ER. Pt wanted to know if she needed any antibiotics or steroids. Breathing is sl better. Offered an appt to be seen in clinic - states Dr Gordy Levan would see her [place]. Husband is having surgery in AM 06/05/15 and wants to be with him. Hilda Blades Dewon Mendizabal RN 06/04/15 10:15AM

## 2015-06-04 NOTE — ED Provider Notes (Signed)
Medical screening examination/treatment/procedure(s) were conducted as a shared visit with non-physician practitioner(s) and myself.  I personally evaluated the patient during the encounter.   EKG Interpretation   Date/Time:  Tuesday June 03 2015 18:46:25 EDT Ventricular Rate:  130 PR Interval:  126 QRS Duration: 86 QT Interval:  316 QTC Calculation: 465 R Axis:   87 Text Interpretation:  Sinus tachycardia Right atrial enlargement Pulmonary  disease pattern Nonspecific ST abnormality Abnormal ECG Since last tracing  rate faster Confirmed by Keegan Bensch  MD-J, Adalida Garver ([id number]) on 06/03/2015 9:04:01 PM      Pt presents to the ED with shortness of breath and wheezing.  Treated with albuterol and solumedrol.  Continues to feel short of breath but vitals are reassuring.  Will consult with PCP for admission , tx of COPD exacerbation.  Dorie Rank, MD 06/04/15 [phone]

## 2015-06-04 NOTE — ED Provider Notes (Signed)
Patient was signed out to me by PA Gwenyth Bender, her evaluation in the emergency department did not indicate a need for admission to the hospital, but patient was adamant that she needed to be admitted.  Fortunately, she is a outpatient clinic patient and her primary care physician is on duty tonight.  She has been consult and will see the patient in the ED. Patient's primary care physician saw the patient in the ED and agrees that the patient can be discharged home.  She discussed with the patient at length.  Discharge plans and she is arranging for a follow-up appointment in the office with her on Monday  Junius Creamer, NP 06/04/15 [id number]  Dorie Rank, MD 06/04/15 [id number]

## 2015-06-05 NOTE — Telephone Encounter (Signed)
I do not think [name] needs steroids or antibiotics at this time.  She was just recently hospitalized and treated for a COPD exacerbation.  I requested that she make a follow up appointment this coming Monday after her husband's surgery.  Please call and have her seen in the clinic early next week.  Thanks.

## 2015-06-05 NOTE — Telephone Encounter (Signed)
Pt aware of Dr Ferd Glassing response. Appt has been made 06/09/15 1:45PM Dr Gordy Levan.

## 2015-06-07 ENCOUNTER — Observation Stay (HOSPITAL_COMMUNITY)
Admission: EM | Admit: 2015-06-07 | Discharge: 2015-06-10 | Disposition: A | Discharge status: Home / Self Care | Payer: Medicare Other | Attending: Internal Medicine | Admitting: Internal Medicine

## 2015-06-07 ENCOUNTER — Emergency Department (HOSPITAL_COMMUNITY): Payer: Medicare Other

## 2015-06-07 ENCOUNTER — Encounter (HOSPITAL_COMMUNITY): Payer: Self-pay | Admitting: *Deleted

## 2015-06-07 DIAGNOSIS — R062 Wheezing: Secondary | ICD-10-CM | POA: Diagnosis not present

## 2015-06-07 DIAGNOSIS — E119 Type 2 diabetes mellitus without complications: Secondary | ICD-10-CM | POA: Insufficient documentation

## 2015-06-07 DIAGNOSIS — F419 Anxiety disorder, unspecified: Secondary | ICD-10-CM | POA: Diagnosis not present

## 2015-06-07 DIAGNOSIS — R197 Diarrhea, unspecified: Secondary | ICD-10-CM | POA: Insufficient documentation

## 2015-06-07 DIAGNOSIS — J383 Other diseases of vocal cords: Secondary | ICD-10-CM | POA: Insufficient documentation

## 2015-06-07 DIAGNOSIS — E785 Hyperlipidemia, unspecified: Secondary | ICD-10-CM | POA: Diagnosis not present

## 2015-06-07 DIAGNOSIS — J441 Chronic obstructive pulmonary disease with (acute) exacerbation: Secondary | ICD-10-CM | POA: Diagnosis not present

## 2015-06-07 DIAGNOSIS — I10 Essential (primary) hypertension: Secondary | ICD-10-CM | POA: Diagnosis not present

## 2015-06-07 DIAGNOSIS — F329 Major depressive disorder, single episode, unspecified: Secondary | ICD-10-CM | POA: Diagnosis not present

## 2015-06-07 DIAGNOSIS — R059 Cough, unspecified: Secondary | ICD-10-CM

## 2015-06-07 DIAGNOSIS — R0602 Shortness of breath: Secondary | ICD-10-CM

## 2015-06-07 DIAGNOSIS — R05 Cough: Secondary | ICD-10-CM

## 2015-06-07 DIAGNOSIS — F17210 Nicotine dependence, cigarettes, uncomplicated: Secondary | ICD-10-CM | POA: Diagnosis not present

## 2015-06-07 DIAGNOSIS — Z853 Personal history of malignant neoplasm of breast: Secondary | ICD-10-CM | POA: Insufficient documentation

## 2015-06-07 DIAGNOSIS — K219 Gastro-esophageal reflux disease without esophagitis: Secondary | ICD-10-CM | POA: Diagnosis not present

## 2015-06-07 LAB — BASIC METABOLIC PANEL
Anion gap: 14 (ref 5–15)
BUN: 11 mg/dL (ref 6–20)
CO2: 26 mmol/L (ref 22–32)
CREATININE: 0.86 mg/dL (ref 0.44–1.00)
Calcium: 9.1 mg/dL (ref 8.9–10.3)
Chloride: 98 mmol/L — ABNORMAL LOW (ref 101–111)
GLUCOSE: 115 mg/dL — AB (ref 65–99)
Potassium: 3.5 mmol/L (ref 3.5–5.1)
Sodium: 138 mmol/L (ref 135–145)

## 2015-06-07 LAB — CBC
HCT: 35.3 % — ABNORMAL LOW (ref 36.0–46.0)
Hemoglobin: 11.6 g/dL — ABNORMAL LOW (ref 12.0–15.0)
MCH: 28.2 pg (ref 26.0–34.0)
MCHC: 32.9 g/dL (ref 30.0–36.0)
MCV: 85.9 fL (ref 78.0–100.0)
Platelets: 322 10*3/uL (ref 150–400)
RBC: 4.11 MIL/uL (ref 3.87–5.11)
RDW: 13.5 % (ref 11.5–15.5)
WBC: 8.7 10*3/uL (ref 4.0–10.5)

## 2015-06-07 LAB — GLUCOSE, CAPILLARY: GLUCOSE-CAPILLARY: 212 mg/dL — AB (ref 65–99)

## 2015-06-07 MED ORDER — IPRATROPIUM-ALBUTEROL 0.5-2.5 (3) MG/3ML IN SOLN
3.0000 mL | Freq: Four times a day (QID) | RESPIRATORY_TRACT | Status: DC
  Administered 2015-06-07 – 2015-06-10 (×11): 3 mL via RESPIRATORY_TRACT
  Filled 2015-06-07 (×12): qty 3

## 2015-06-07 MED ORDER — INSULIN ASPART 100 UNIT/ML ~~LOC~~ SOLN: 0.0000 [IU] | Freq: Three times a day (TID) | SUBCUTANEOUS | Status: DC

## 2015-06-07 MED ORDER — BUDESONIDE-FORMOTEROL FUMARATE 160-4.5 MCG/ACT IN AERO
2.0000 | INHALATION_SPRAY | Freq: Two times a day (BID) | RESPIRATORY_TRACT | Status: DC
  Administered 2015-06-08 – 2015-06-10 (×5): 2 via RESPIRATORY_TRACT
  Filled 2015-06-07: qty 6

## 2015-06-07 MED ORDER — IPRATROPIUM BROMIDE 0.02 % IN SOLN
0.5000 mg | Freq: Once | RESPIRATORY_TRACT | Status: AC
Start: 2015-06-07 — End: 2015-06-07
  Administered 2015-06-07: 0.5 mg via RESPIRATORY_TRACT
  Filled 2015-06-07: qty 2.5

## 2015-06-07 MED ORDER — ENOXAPARIN SODIUM 40 MG/0.4ML ~~LOC~~ SOLN
40.0000 mg | SUBCUTANEOUS | Status: DC
  Administered 2015-06-08 – 2015-06-09 (×3): 40 mg via SUBCUTANEOUS
  Filled 2015-06-07 (×3): qty 0.4

## 2015-06-07 MED ORDER — SODIUM CHLORIDE 0.9 % IJ SOLN
3.0000 mL | Freq: Two times a day (BID) | INTRAMUSCULAR | Status: DC
  Administered 2015-06-08 – 2015-06-10 (×6): 3 mL via INTRAVENOUS

## 2015-06-07 MED ORDER — PRAVASTATIN SODIUM 40 MG PO TABS
40.0000 mg | ORAL_TABLET | Freq: Every day | ORAL | Status: DC
  Administered 2015-06-08 – 2015-06-10 (×4): 40 mg via ORAL
  Filled 2015-06-07 (×4): qty 1

## 2015-06-07 MED ORDER — INSULIN ASPART 100 UNIT/ML ~~LOC~~ SOLN
0.0000 [IU] | Freq: Three times a day (TID) | SUBCUTANEOUS | Status: DC
Start: 2015-06-07 — End: 2015-06-10
  Administered 2015-06-08: 11 [IU] via SUBCUTANEOUS
  Administered 2015-06-08: 5 [IU] via SUBCUTANEOUS
  Administered 2015-06-08: 8 [IU] via SUBCUTANEOUS
  Administered 2015-06-08 – 2015-06-09 (×2): 5 [IU] via SUBCUTANEOUS
  Administered 2015-06-09: 3 [IU] via SUBCUTANEOUS
  Administered 2015-06-09: 15 [IU] via SUBCUTANEOUS
  Administered 2015-06-10: 8 [IU] via SUBCUTANEOUS
  Administered 2015-06-10: 3 [IU] via SUBCUTANEOUS
  Administered 2015-06-10: 8 [IU] via SUBCUTANEOUS

## 2015-06-07 MED ORDER — CITALOPRAM HYDROBROMIDE 20 MG PO TABS
30.0000 mg | ORAL_TABLET | Freq: Every day | ORAL | Status: DC
  Administered 2015-06-08: 30 mg via ORAL
  Filled 2015-06-07: qty 2

## 2015-06-07 MED ORDER — ALBUTEROL SULFATE (2.5 MG/3ML) 0.083% IN NEBU: 2.5000 mg | INHALATION_SOLUTION | RESPIRATORY_TRACT | Status: DC | PRN

## 2015-06-07 MED ORDER — IPRATROPIUM BROMIDE 0.02 % IN SOLN
1.0000 mg | Freq: Once | RESPIRATORY_TRACT | Status: AC
  Administered 2015-06-07: 1 mg via RESPIRATORY_TRACT
  Filled 2015-06-07: qty 5

## 2015-06-07 MED ORDER — CARVEDILOL 6.25 MG PO TABS
6.2500 mg | ORAL_TABLET | Freq: Two times a day (BID) | ORAL | Status: DC
  Administered 2015-06-08 – 2015-06-10 (×6): 6.25 mg via ORAL
  Filled 2015-06-07 (×6): qty 1

## 2015-06-07 MED ORDER — ASPIRIN EC 81 MG PO TBEC: 81.0000 mg | DELAYED_RELEASE_TABLET | Freq: Every day | ORAL | Status: DC

## 2015-06-07 MED ORDER — ALBUTEROL (5 MG/ML) CONTINUOUS INHALATION SOLN
10.0000 mg/h | INHALATION_SOLUTION | RESPIRATORY_TRACT | Status: DC
  Administered 2015-06-07: 10 mg/h via RESPIRATORY_TRACT
  Filled 2015-06-07: qty 20

## 2015-06-07 MED ORDER — METHYLPREDNISOLONE SODIUM SUCC 125 MG IJ SOLR: 80.0000 mg | Freq: Two times a day (BID) | INTRAMUSCULAR | Status: DC

## 2015-06-07 MED ORDER — METHYLPREDNISOLONE SODIUM SUCC 125 MG IJ SOLR
125.0000 mg | Freq: Once | INTRAMUSCULAR | Status: AC
  Administered 2015-06-07: 125 mg via INTRAVENOUS
  Filled 2015-06-07: qty 2

## 2015-06-07 MED ORDER — LOPERAMIDE HCL 2 MG PO CAPS
4.0000 mg | ORAL_CAPSULE | Freq: Once | ORAL | Status: AC
  Administered 2015-06-07: 4 mg via ORAL
  Filled 2015-06-07: qty 2

## 2015-06-07 MED ORDER — ACETAMINOPHEN 500 MG PO TABS
1000.0000 mg | ORAL_TABLET | Freq: Once | ORAL | Status: AC
  Administered 2015-06-07: 1000 mg via ORAL
  Filled 2015-06-07: qty 2

## 2015-06-07 MED ORDER — ALBUTEROL SULFATE (2.5 MG/3ML) 0.083% IN NEBU
5.0000 mg | INHALATION_SOLUTION | Freq: Once | RESPIRATORY_TRACT | Status: AC
  Administered 2015-06-07: 5 mg via RESPIRATORY_TRACT
  Filled 2015-06-07: qty 6

## 2015-06-07 NOTE — Progress Notes (Addendum)
[facility] paged regarding pt's CBG of [phone] [name] 06/07/2015 10:44 PM   IM Residency placed order for sliding insulin. Will carry out order  [name] 06/08/2015 10:47 PM

## 2015-06-07 NOTE — H&P (Signed)
Date: 06/07/2015               Patient Name:  [name] MRN: [id number]  DOB: 1951/05/10 Age / Sex: 64 y.o., female   PCP: Christie Bales, MD         Medical Service: Internal Medicine Teaching Service         Attending Physician: Dr. Sid Falcon, MD    First Contact: Dr. Ignacia [name] Pager: [phone]  Second Contact: Dr. Duwaine [name] Pager: [phone]       After Hours (After 5p/  First Contact Pager: [phone]  weekends / holidays): Second Contact Pager: [phone]   Chief Complaint: shortness of breath  History of Present Illness:   [name] is a 64 year old woman with extensive PMH of presumed COPD with multiple exacerbations, NIDDM, HTN, history of breast cancer s/p surgeries in 1991, and HLD who is here for continued SOB, cough and wheezing with no improvement since being seen in the ER on 6/27. THis has been an ongoing issue for many months now.  SHe was seen by [name] on 6/6 in the clinic after Pt had completed prednisone and azithromycin for 5 days and she was having continued "sinus infection" and wheezing.    She was admitted on 7/7-7/9 with COPD exacerbation, and was given Solu-Medrol and nebulizer treatments.No antibiotics were prescribed. Was discharged on prednisone 40 mg daily for 5 days for home, which pt says she completed.   At the time of follow up on 7/15, she reported completing her course of steroids and did not complain of persistent symptoms. She also reported feeling confused about her medication regimen and was referred to [name] for med reconciliation which Pt. hasn't done yet.  She returned with similar symptoms in the ER on 7/27, was seen by [name] who is her PCP who did not feel she needed an admission and was asked to come back to clinic this Monday 8/1. She improved with albuterol, ipratropium and solumedrol.  She presents today with similar symptoms. At home, she is on albuterol, symbicort, and it has not helped and feels SOB. She was  prescribed but can not afford spiriva. She is not on oxygen at home. She continues to smoke 1 ppd , but has not since 3 days.  Her husband and daughter also smoke in the house. She has had PFTs ordered in Jan and in June, but was not able to undergo them. Per chart review, She has been intubated in Nov 2015. Her SOB is associated cough productive of white sputum. She denied any fevers, chills or any other constitutional symptoms, or any sick contacts, She did mention some chest pain over her ribs which she has been having for few weeks now- it was also present last time she was here and EKG was normal.  She also mentioned that she has been having some diarrhea (x4 today), and off and on for the last few months. There is recent history of both clindamycin (6/14 for insect bite) and azithromycin use.  In the ER today , she was afebrile, and had completed continuous albuterol nebulizer and Solumedrol 125 mg IV. Her CXR was unremarkable.   She continued to wheeze with Sp02 being 89%, then was given 2L O2, and IMTS was called for admission.      Meds: No current facility-administered medications for this encounter.   Current Outpatient Prescriptions  Medication Sig Dispense Refill  . acetaminophen (TYLENOL) 325 MG tablet Take 650  mg by mouth 2 (two) times daily as needed for headache.    . albuterol (PROVENTIL) (2.5 MG/3ML) 0.083% nebulizer solution Take 3 mLs (2.5 mg total) by nebulization every 6 (six) hours as needed for wheezing or shortness of breath. 360 mL 3  . albuterol (VENTOLIN HFA) 108 (90 BASE) MCG/ACT inhaler Inhale 2 puffs into the lungs every 6 (six) hours as needed for wheezing or shortness of breath. 1 Inhaler 3  . budesonide-formoterol (SYMBICORT) 160-4.5 MCG/ACT inhaler Inhale 2 puffs into the lungs 2 (two) times daily. 1 Inhaler 12  . carvedilol (COREG) 6.25 MG tablet take 1 tablet by mouth twice a day with meals 60 tablet 3  . citalopram (CELEXA) 20 MG tablet Take 1.5 tablets (30  mg total) by mouth daily. 45 tablet 3  . cyclobenzaprine (FLEXERIL) 5 MG tablet Take 1 tablet (5 mg total) by mouth at bedtime as needed for muscle spasms. 15 tablet 0  . guaiFENesin-codeine 100-10 MG/5ML syrup Take 5 mLs by mouth every 4 (four) hours as needed for cough. 120 mL 0  . losartan-hydrochlorothiazide (HYZAAR) 100-25 MG per tablet Take 1 tablet by mouth daily. 30 tablet 2  . metFORMIN (GLUCOPHAGE) 1000 MG tablet Take 1 tablet (1,000 mg total) by mouth 2 (two) times daily with a meal. 60 tablet 11  . pravastatin (PRAVACHOL) 40 MG tablet TAKE 1 TABLET BY MOUTH DAILY (Patient taking differently: TAKE 1 TABLET BY MOUTH DAILY AT BEDTIME) 90 tablet 0  . sodium chloride (OCEAN) 0.65 % SOLN nasal spray Place 2 sprays into both nostrils 3 (three) times daily. (Patient taking differently: Place 1 spray into both nostrils 3 (three) times daily. ) 15 mL 3  . ACCU-CHEK FASTCLIX LANCETS MISC 1 each by Other route See admin instructions. Check blood sugar daily as needed for high blood sugar.    . Blood Glucose Monitoring Suppl (ACCU-CHEK NANO SMARTVIEW) W/DEVICE KIT 1 each by Other route See admin instructions. Check blood sugar daily as needed for high blood sugar.    Marland Kitchen glucose blood (ACCU-CHEK SMARTVIEW) test strip Use to check blood sugar 1 to 2 times daily. diag code E 11.9. Non- insulin dependent 100 each 6  . [DISCONTINUED] albuterol (PROVENTIL,VENTOLIN) 90 MCG/ACT inhaler Inhale 2 puffs into the lungs every 6 (six) hours as needed for wheezing. 17 g 12    Allergies: Allergies as of 06/07/2015 - Review Complete 06/07/2015  Allergen Reaction Noted  . Ace inhibitors Swelling 12/26/2014  . Flonase [fluticasone] Other (See Comments) 12/25/2014   Past Medical History  Diagnosis Date  . Hyperlipidemia   . Hypertension   . Tobacco abuse   . COPD (chronic obstructive pulmonary disease)     History of multiple hospital admissions for exercabation   . Asthma   . Breast cancer 1991    s/p  lumpectomy, chemotherapy and radiation therapy in 1991. Mammogram in 2007 was normal.  . Sigmoid diverticulitis 80/2008  . Anxiety   . Depression   . Obesity   . GERD (gastroesophageal reflux disease)   . Heart murmur 10/05/11    "first time I ever heard I had one was today"  . Pneumonia   . Shortness of breath 10/05/11    "at rest; lying down; w/exertion"  . Diabetes mellitus   . Bronchitis     h/o  . Diarrhea     h/o  . Constipated     h/o  . COPD with exacerbation 04/06/2009    Qualifier: Diagnosis of  By: Eyvonne Mechanic MD,  Doyne Keel     Past Surgical History  Procedure Laterality Date  . Dobutamine stress echo  08/2004    Inferior ischemia, normal LV systolic function, no significant CAD  . Abdominal hysterectomy    . Breast surgery  1991    lumphectomy right breast  . Neck surgery  2012    "Dr. Lynann Bologna  put plate in; did something to my vertebrae"   Family History  Problem Relation Age of Onset  . Cancer Mother    History   Social History  . Marital Status: Married    Spouse Name: N/A  . Number of Children: N/A  . Years of Education: 12   Occupational History  .  Unemployed   Social History Main Topics  . Smoking status: Current Some Day Smoker -- 0.10 packs/day for 40 years    Types: Cigarettes    Last Attempt to Quit: 09/11/2014  . Smokeless tobacco: Never Used     Comment: slowly quitting.DOWN TO ABOUT 6 CIGARETTES PER DAY  . Alcohol Use: 2.4 oz/week    4 Cans of beer per week     Comment: "only on the weekends"  . Drug Use: No  . Sexual Activity: Not on file   Other Topics Concern  . Not on file   Social History Narrative   Lives in [place] with her husband.   Takes care of 3 grand children.   Trying to find a job, has financial difficulties.          Review of Systems: Gen: no fevers or weight change, some fatigue, less appetite in last few days HEENT: no headaches, no trauma. CV: some musculoskeletal chest pain, no palpitations, no dyspnea,  orthopnea or PND, no edema, no LOC, has history of HTN Resp: some SOB and wheezing, and some clear sputum production, no hemoptysis GI: felt some nausea due to not eating food, but had good apetite, some watery diarrhea, no blood in stool Skin: no rashes Neuro: no numbness or tingling,  Psychiatric: no depression, some anxiety  Physical Exam: Blood pressure 151/69, pulse 103, temperature 98.5 F (36.9 C), temperature source Oral, resp. rate 18, weight 214 lb (97.07 kg), SpO2 95 %.  General: A&O, mild distress- no accessory muscle use HEENT: EOMI, no ear/nose/throat erythema, dry mucous membranes Neck: supple, midline trachea,  CV: RRR, normal s1, s2, no m/r/g, no carotid bruits appreciated, no JVD appreciated Resp: slightly restricted breath sounds, wheezing heard b/L Abdomen: soft, nontender, nondistended,  Skin: warm, dry, intact, no open lesions or rashes noted Extremities: pulses intact b/l, no edema, clubbing or cyanosis Neurologic: A&O   Lab results: BMP Latest Ref Rng 06/07/2015 06/03/2015 05/15/2015  Glucose 65 - 99 mg/dL 115(H) 122(H) 148(H)  BUN 6 - 20 mg/dL _0 Creatinine 0.44 - 1.00 mg/dL 0.86 0.95 0.80  Sodium 135 - 145 mmol/L 138 135 137  Potassium 3.5 - 5.1 mmol/L 3.5 3.9 3.8  Chloride 101 - 111 mmol/L 98(L) 96(L) 97(L)  CO2 22 - 32 mmol/L _1 Calcium 8.9 - 10.3 mg/dL 9.1 9.1 9.1    CBC Latest Ref Rng 06/07/2015 06/03/2015 05/15/2015  WBC 4.0 - 10.5 K/uL 8.7 8.1 11.4(H)  Hemoglobin 12.0 - 15.0 g/dL 11.6(L) 12.4 11.4(L)  Hematocrit 36.0 - 46.0 % 35.3(L) 37.1 34.1(L)  Platelets 150 - 400 K/uL 322 272 357   CBG (last 3)   Recent Labs  06/07/15 2230  GLUCAP 212*       Imaging results:  Dg  Chest 2 View  06/07/2015   CLINICAL DATA:  Shortness of breath and cough worsening over the past week. History of breast cancer.  EXAM: CHEST  2 VIEW  COMPARISON:  06/03/2015 and 05/15/2015  FINDINGS: Patient slightly rotated to the left. Lungs are adequately  inflated with no focal consolidation or effusion. Mild stable cardiomegaly. Remainder of the exam is unchanged.  IMPRESSION: No active cardiopulmonary disease.   Electronically Signed   By: Marin Olp M.D.   On: 06/07/2015 17:03    Other results: EKG: as compared to 06/04/15, sinus tachycardia, normal axis    Assessment & Plan by Problem: 64 yo woman with PMH of COPD with multiple exacerbations in the past, HTN, HLD, vocal cord dysfunction, h/o of breast cancer, GERD, and Type II Diabetes, admitted for COPD exacerbation again  Presumed COPD Exacerbation: given her smoking history- likely, because of cough, sputum production and dyspnea. No confirmatory PFTS on file, Wheezing and some rhonchi heard bilaterally both at the apex and base of the lung, and restricted air movements, Likely not pneumonia as  CXR unremarkable for any signs of pneumonia,  No WC elevation  CHF less likely- no signs of hypovolemia, her echo back in Nov 2015- showed EF 52%, grade 1 diastolic dysfunction  - On solumedrol 80 mg q12H -On Duonebs - 0.5-2.5 q6H -Continue symbicort 2 puffs BID -Albuterol 2.5 mg q2H PRN -guaifenesin-codeine for cough q4h -She needs PFTS, has never had it done -Will consider consult for case management for med management or f/u in clinic on Monday  Diarrhea: Per chart review, multiple treatment with antibiotics- clindamycin and cdiff Her history- particularly frequency and interval remain unknown -Reassess as her resp symptoms improve. Given her treatment with clinda and z-pak in the last few weeks, may want to consider testing for cdiff.  T2DM:  -Glucose on admission 212 -Last A1c 6.1 in June  -On sliding scale novolog insulin -Holding home metformin  HTN: stable, on admission 145/75 Home meds include hyzaar and coreg -On home coreg 6.25 BID - We held her home hyzaar as her fluid intake in the last few days was low but can restart if she shows HTN  HLD:  On pravastatin 40 mg  QD On aspirin 81 mg QD  Depression: On celexa 30 mg QD    Diet: Carb modified Diet  DVT/PE PPx: Lovenox Declo  Code: Full Code  Contact: Husband     Dispo: Disposition is deferred at this time, awaiting improvement of current medical problems. Anticipated discharge in approximately 1 day(s).   The patient does have a current PCP (Christie Bales, MD) and does need an [facility] hospital follow-up appointment after discharge.  The patient does not know have transportation limitations that hinder transportation to clinic appointments.  Signed: Burgess Estelle, MD 06/07/2015, 9:02 PM

## 2015-06-07 NOTE — ED Notes (Signed)
Patient C/O shortness of breath that began yesterday.  States that she was to see her MD Monday but states that she could not wait.

## 2015-06-07 NOTE — Progress Notes (Signed)
Received report on patient from [place], ED nurse.

## 2015-06-07 NOTE — ED Provider Notes (Signed)
CSN: [id number]     Arrival date & time 06/07/15  1345 History   First MD Initiated Contact with Patient 06/07/15 1604     Chief Complaint  Patient presents with  . Cough  . Shortness of Breath     (Consider location/radiation/quality/duration/timing/severity/associated sxs/prior Treatment) Patient is a 64 y.o. female presenting with cough and shortness of breath. The history is provided by the patient and medical records. No language interpreter was used.  Cough Associated symptoms: shortness of breath and wheezing   Associated symptoms: no chest pain, no diaphoresis, no fever, no headaches and no rash   Shortness of Breath Associated symptoms: cough and wheezing   Associated symptoms: no abdominal pain, no chest pain, no diaphoresis, no fever, no headaches, no rash and no vomiting      [name] is a 64 y.o. female  with a hx of HLD, HTN, COPD, asthma, anxiety, depression, NIDDM presents to the Emergency Department complaining of gradual, persistent, progressively worsening cough and SOB onset several days ago. Associated symptoms include fatigue, sinus congestion, generalized headache, production of sputum with cough, diarrhea (x4 today).  Pt reports taking her regular medications and nebulizer at home without relief.   Nothing makes it better and exertion makes it worse.  Pt denies fever, chills, neck pain, chest pain, abd pain, N/V.   Pt was evaluated for same on 06/04/15.  At that time her wheezing improved, but she requested admission.  She was evaluated in the ED by her PCP (outpatient clinic) and they agreed with discharge home. Pt reports she was d/c home without steroids or antibiotic.      Past Medical History  Diagnosis Date  . Hyperlipidemia   . Hypertension   . Tobacco abuse   . COPD (chronic obstructive pulmonary disease)     History of multiple hospital admissions for exercabation   . Asthma   . Breast cancer 1991    s/p lumpectomy, chemotherapy and radiation  therapy in 1991. Mammogram in 2007 was normal.  . Sigmoid diverticulitis 80/2008  . Anxiety   . Depression   . Obesity   . GERD (gastroesophageal reflux disease)   . Heart murmur 10/05/11    "first time I ever heard I had one was today"  . Pneumonia   . Shortness of breath 10/05/11    "at rest; lying down; w/exertion"  . Diabetes mellitus   . Bronchitis     h/o  . Diarrhea     h/o  . Constipated     h/o  . COPD with exacerbation 04/06/2009    Qualifier: Diagnosis of  By: Eyvonne Mechanic MD, Doyne Keel     Past Surgical History  Procedure Laterality Date  . Dobutamine stress echo  08/2004    Inferior ischemia, normal LV systolic function, no significant CAD  . Abdominal hysterectomy    . Breast surgery  1991    lumphectomy right breast  . Neck surgery  2012    "Dr. Lynann Bologna  put plate in; did something to my vertebrae"   Family History  Problem Relation Age of Onset  . Cancer Mother    History  Substance Use Topics  . Smoking status: Current Some Day Smoker -- 0.10 packs/day for 40 years    Types: Cigarettes    Last Attempt to Quit: 09/11/2014  . Smokeless tobacco: Never Used     Comment: slowly quitting.DOWN TO ABOUT 6 CIGARETTES PER DAY  . Alcohol Use: 2.4 oz/week    4 Cans of  beer per week     Comment: "only on the weekends"   OB History    No data available     Review of Systems  Constitutional: Negative for fever, diaphoresis, appetite change, fatigue and unexpected weight change.  HENT: Negative for mouth sores.   Eyes: Negative for visual disturbance.  Respiratory: Positive for cough, chest tightness, shortness of breath and wheezing.   Cardiovascular: Negative for chest pain.  Gastrointestinal: Negative for nausea, vomiting, abdominal pain, diarrhea and constipation.  Endocrine: Negative for polydipsia, polyphagia and polyuria.  Genitourinary: Negative for dysuria, urgency, frequency and hematuria.  Musculoskeletal: Negative for back pain and neck stiffness.   Skin: Negative for rash.  Allergic/Immunologic: Negative for immunocompromised state.  Neurological: Negative for syncope, light-headedness and headaches.  Hematological: Does not bruise/bleed easily.  Psychiatric/Behavioral: Negative for sleep disturbance. The patient is not nervous/anxious.       Allergies  Ace inhibitors and Flonase  Home Medications   Prior to Admission medications   Medication Sig Start Date End Date Taking? Authorizing Provider  acetaminophen (TYLENOL) 325 MG tablet Take 650 mg by mouth 2 (two) times daily as needed for headache.   Yes Historical Provider, MD  albuterol (PROVENTIL) (2.5 MG/3ML) 0.083% nebulizer solution Take 3 mLs (2.5 mg total) by nebulization every 6 (six) hours as needed for wheezing or shortness of breath. 03/06/15  Yes Lucious Groves, DO  albuterol (VENTOLIN HFA) 108 (90 BASE) MCG/ACT inhaler Inhale 2 puffs into the lungs every 6 (six) hours as needed for wheezing or shortness of breath. 08/07/14  Yes Jones Bales, MD  budesonide-formoterol (SYMBICORT) 160-4.5 MCG/ACT inhaler Inhale 2 puffs into the lungs 2 (two) times daily. 03/06/15  Yes Lucious Groves, DO  carvedilol (COREG) 6.25 MG tablet take 1 tablet by mouth twice a day with meals 03/13/15  Yes Jones Bales, MD  citalopram (CELEXA) 20 MG tablet Take 1.5 tablets (30 mg total) by mouth daily. 04/08/15  Yes Jones Bales, MD  cyclobenzaprine (FLEXERIL) 5 MG tablet Take 1 tablet (5 mg total) by mouth at bedtime as needed for muscle spasms. 05/23/15  Yes Zada Finders, MD  guaiFENesin-codeine 100-10 MG/5ML syrup Take 5 mLs by mouth every 4 (four) hours as needed for cough. 05/17/15  Yes Corky Sox, MD  losartan-hydrochlorothiazide (HYZAAR) 100-25 MG per tablet Take 1 tablet by mouth daily. 04/08/15  Yes Jessee Avers, MD  metFORMIN (GLUCOPHAGE) 1000 MG tablet Take 1 tablet (1,000 mg total) by mouth 2 (two) times daily with a meal. 01/03/15  Yes Bartholomew Crews, MD  pravastatin  (PRAVACHOL) 40 MG tablet TAKE 1 TABLET BY MOUTH DAILY Patient taking differently: TAKE 1 TABLET BY MOUTH DAILY AT BEDTIME 04/08/15  Yes Jones Bales, MD  sodium chloride (OCEAN) 0.65 % SOLN nasal spray Place 2 sprays into both nostrils 3 (three) times daily. Patient taking differently: Place 1 spray into both nostrils 3 (three) times daily.  04/14/15  Yes Jones Bales, MD  ACCU-CHEK FASTCLIX LANCETS MISC 1 each by Other route See admin instructions. Check blood sugar daily as needed for high blood sugar.    Historical Provider, MD  Blood Glucose Monitoring Suppl (ACCU-CHEK NANO SMARTVIEW) W/DEVICE KIT 1 each by Other route See admin instructions. Check blood sugar daily as needed for high blood sugar.    Historical Provider, MD  glucose blood (ACCU-CHEK SMARTVIEW) test strip Use to check blood sugar 1 to 2 times daily. diag code E 11.9. Non- insulin dependent 10/02/14  Sid Falcon, MD   BP 151/69 mmHg  Pulse 103  Temp(Src) 98.5 F (36.9 C) (Oral)  Resp 18  Wt 214 lb (97.07 kg)  SpO2 95% Physical Exam  Constitutional: She is oriented to person, place, and time. She appears well-developed and well-nourished. No distress.  HENT:  Head: Normocephalic and atraumatic.  Right Ear: Tympanic membrane, external ear and ear canal normal.  Left Ear: Tympanic membrane, external ear and ear canal normal.  Nose: Mucosal edema and rhinorrhea present. No epistaxis. Right sinus exhibits no maxillary sinus tenderness and no frontal sinus tenderness. Left sinus exhibits no maxillary sinus tenderness and no frontal sinus tenderness.  Mouth/Throat: Uvula is midline and mucous membranes are normal. Mucous membranes are not pale and not cyanotic. No oropharyngeal exudate, posterior oropharyngeal edema, posterior oropharyngeal erythema or tonsillar abscesses.  Eyes: Conjunctivae are normal. Pupils are equal, round, and reactive to light.  Neck: Normal range of motion and full passive range of motion without  pain.  Cardiovascular: Normal rate, normal heart sounds and intact distal pulses.   Pulmonary/Chest: Effort normal. No stridor. She has wheezes.  Wheezes throughout Congested cough  Abdominal: Soft. Bowel sounds are normal. There is no tenderness.  Musculoskeletal: Normal range of motion.  Lymphadenopathy:    She has no cervical adenopathy.  Neurological: She is alert and oriented to person, place, and time.  Skin: Skin is warm and dry. No rash noted. She is not diaphoretic.  Psychiatric: She has a normal mood and affect.  Nursing note and vitals reviewed.   ED Course  Procedures (including critical care time) Labs Review Labs Reviewed  BASIC METABOLIC PANEL - Abnormal; Notable for the following:    Chloride 98 (*)    Glucose, Bld 115 (*)    All other components within normal limits  CBC - Abnormal; Notable for the following:    Hemoglobin 11.6 (*)    HCT 35.3 (*)    All other components within normal limits    Imaging Review Dg Chest 2 View  06/07/2015   CLINICAL DATA:  Shortness of breath and cough worsening over the past week. History of breast cancer.  EXAM: CHEST  2 VIEW  COMPARISON:  06/03/2015 and 05/15/2015  FINDINGS: Patient slightly rotated to the left. Lungs are adequately inflated with no focal consolidation or effusion. Mild stable cardiomegaly. Remainder of the exam is unchanged.  IMPRESSION: No active cardiopulmonary disease.   Electronically Signed   By: Marin Olp M.D.   On: 06/07/2015 17:03     EKG Interpretation None      MDM   Final diagnoses:  Cough  SOB (shortness of breath)  Wheezing  COPD exacerbation  Diarrhea   Mora Bellman Heffley presents with persistent cough, chest tightness and shortness of breath for the last several days. Record review says that she was evaluated on 06/04/2015. At the time she was evaluated by the resident teaching service to felt she was safe for discharge home. She was discharged home without antibiotics or steroids.  She returns today with persistent and worsening shortness of breath.  6:00PM Patient with persistent wheezing after albuterol nebulizer. Will give continuous nebulizer.  8:51 PM Pt has completed continuous nebulizer and continues to wheeze.  Patient with oxygen desaturation to 89% on room air approximately quarter after 8. She was placed on 2 L via nasal cannula with an improvement however with her persistent wheezing and hypoxia after treatment I feel that she needs an admission. She's been given Solu-Medrol.  Labs are reassuring. Chest x-ray is without evidence of pneumonia.  9:02 PM Pt discussed with Dr. Posey Pronto who will admit.   BP 151/69 mmHg  Pulse 103  Temp(Src) 98.5 F (36.9 C) (Oral)  Resp 18  Wt 214 lb (97.07 kg)  SpO2 95%   The patient was discussed with and seen by Dr. Doy Mince who agrees with the treatment plan.   Jarrett Soho Shantaya Bluestone, PA-C 06/07/15 2103  Serita Grit, MD 06/08/15 [phone]

## 2015-06-07 NOTE — ED Notes (Signed)
In room to check on patient.  O2 sat noted to be 88% at this time on RA.  Applied O2 at 2 L [place] at this time.  Sat up to 94%.

## 2015-06-07 NOTE — Progress Notes (Signed)
Admission Note  Patient arrived to floor in room 5W11 via bed from ED. Patient alert and oriented X 4. Vitals signs  Oral temperature 98.7 F       Blood pressure 122/61       Pulse 94       RR 18       SpO2 99 % on 2L of Oxygen Nasal cannula. Complains of pain 7/10 over right rib cage. Skin intact, no pressure ulcer noted in sacral area. Telemetry monitor placed.  Patient's ID armband verified with patient/ family, and in place. Information packet given to patient/ family. Fall risk assessed, SR up X2, patient/ family able to verbalize understanding of risks associated with falls and to call nurse or staff to assist before getting out of bed. Patient/ family oriented to room and equipment. Call bell within reach.

## 2015-06-07 NOTE — ED Notes (Signed)
Pt states that she was recently seen last week for SOB and cough. Pt states that she was discharged and symptoms have continues and worsened.

## 2015-06-08 ENCOUNTER — Encounter (HOSPITAL_COMMUNITY): Payer: Self-pay

## 2015-06-08 DIAGNOSIS — R197 Diarrhea, unspecified: Secondary | ICD-10-CM | POA: Diagnosis present

## 2015-06-08 DIAGNOSIS — E1165 Type 2 diabetes mellitus with hyperglycemia: Secondary | ICD-10-CM | POA: Diagnosis not present

## 2015-06-08 DIAGNOSIS — F17210 Nicotine dependence, cigarettes, uncomplicated: Secondary | ICD-10-CM

## 2015-06-08 DIAGNOSIS — J441 Chronic obstructive pulmonary disease with (acute) exacerbation: Secondary | ICD-10-CM | POA: Diagnosis not present

## 2015-06-08 LAB — GLUCOSE, CAPILLARY
Glucose-Capillary: 296 mg/dL — ABNORMAL HIGH (ref 65–99)
Glucose-Capillary: 246 mg/dL — ABNORMAL HIGH (ref 65–99)
Glucose-Capillary: 331 mg/dL — ABNORMAL HIGH (ref 65–99)
Glucose-Capillary: 213 mg/dL — ABNORMAL HIGH (ref 65–99)

## 2015-06-08 MED ORDER — GUAIFENESIN-CODEINE 100-10 MG/5ML PO SOLN
5.0000 mL | ORAL | Status: DC | PRN
  Administered 2015-06-08 – 2015-06-10 (×8): 5 mL via ORAL
  Filled 2015-06-08 (×10): qty 5

## 2015-06-08 MED ORDER — OXYCODONE-ACETAMINOPHEN 5-325 MG PO TABS
1.0000 | ORAL_TABLET | Freq: Once | ORAL | Status: AC
  Administered 2015-06-08: 1 via ORAL
  Filled 2015-06-08: qty 1

## 2015-06-08 MED ORDER — PREDNISONE 20 MG PO TABS
40.0000 mg | ORAL_TABLET | Freq: Every day | ORAL | Status: DC
  Administered 2015-06-08 – 2015-06-10 (×3): 40 mg via ORAL
  Filled 2015-06-08 (×2): qty 2
  Filled 2015-06-08: qty 4

## 2015-06-08 MED ORDER — INSULIN ASPART 100 UNIT/ML ~~LOC~~ SOLN: 4.0000 [IU] | Freq: Three times a day (TID) | SUBCUTANEOUS | Status: DC

## 2015-06-08 MED ORDER — INSULIN ASPART 100 UNIT/ML ~~LOC~~ SOLN
4.0000 [IU] | Freq: Three times a day (TID) | SUBCUTANEOUS | Status: DC
  Administered 2015-06-08 – 2015-06-10 (×7): 4 [IU] via SUBCUTANEOUS

## 2015-06-08 MED ORDER — CETYLPYRIDINIUM CHLORIDE 0.05 % MT LIQD
7.0000 mL | Freq: Two times a day (BID) | OROMUCOSAL | Status: DC
  Administered 2015-06-08 – 2015-06-10 (×5): 7 mL via OROMUCOSAL

## 2015-06-08 MED ORDER — DICLOFENAC SODIUM 75 MG PO TBEC
75.0000 mg | DELAYED_RELEASE_TABLET | Freq: Three times a day (TID) | ORAL | Status: DC
  Administered 2015-06-08 – 2015-06-10 (×7): 75 mg via ORAL
  Filled 2015-06-08 (×8): qty 1

## 2015-06-08 MED ORDER — OXYCODONE-ACETAMINOPHEN 5-325 MG PO TABS: 1.0000 | ORAL_TABLET | Freq: Once | ORAL | Status: DC

## 2015-06-08 MED ORDER — CITALOPRAM HYDROBROMIDE 20 MG PO TABS
20.0000 mg | ORAL_TABLET | Freq: Every day | ORAL | Status: DC
  Administered 2015-06-09: 20 mg via ORAL
  Filled 2015-06-08: qty 1

## 2015-06-08 NOTE — Progress Notes (Signed)
Subjective: Feeling better than yesterday, still not at baseline. Spoke at length sitting in bed on room air without dyspnea. Became tearful during interview fearing she will end up returning to the hospital immediately if she leaves at this time. Objective: Vital signs in last 24 hours: Filed Vitals:   06/07/15 2216 06/08/15 0601 06/08/15 1012 06/08/15 1327  BP: 122/61 130/62 131/80 138/66  Pulse: 94 95 104 91  Temp: 98.7 F (37.1 C) 98.2 F (36.8 C)  98.2 F (36.8 C)  TempSrc: Oral Oral  Oral  Resp: 18 18  18   Height: 5\' 7"  (1.702 m)     Weight: 99.4 kg (219 lb 2.2 oz)     SpO2: 99% 100%  94%   Weight change:   Intake/Output Summary (Last 24 hours) at 06/08/15 1400 Last data filed at 06/08/15 1345  Gross per 24 hour  Intake   1160 ml  Output    800 ml  Net    360 ml   GENERAL- alert, co-operative, NAD HEENT- oral mucosa appears moist, moist mucous membranes, no oropharyngeal erythema CARDIAC- RRR, no murmurs, rubs or gallops. RESP- CTAB, good air movement, no wheezes or crackles. ABDOMEN- Soft, nontender, no guarding or rebound, bowel sounds present.  EXTREMITIES- pulse 2+, symmetric, no pedal edema. SKIN- Warm, dry, No rash or lesion. PSYCH- Tearful and frustrated when discussing disease and frequent recent hospitalizations, speech appropriate  Lab Results: Basic Metabolic Panel:  Recent Labs Lab 06/03/15 2006 06/07/15 1705  NA 135 138  K 3.9 3.5  CL 96* 98*  CO2 26 26  GLUCOSE 122* 115*  BUN 6 11  CREATININE 0.95 0.86  CALCIUM 9.1 9.1   Liver Function Tests: No results for input(s): AST, ALT, ALKPHOS, BILITOT, PROT, ALBUMIN in the last 168 hours. No results for input(s): LIPASE, AMYLASE in the last 168 hours. No results for input(s): AMMONIA in the last 168 hours. CBC:  Recent Labs Lab 06/03/15 2006 06/07/15 1705  WBC 8.1 8.7  NEUTROABS 5.5  --   HGB 12.4 11.6*  HCT 37.1 35.3*  MCV 86.5 85.9  PLT 272 322   Cardiac Enzymes: No results for  input(s): CKTOTAL, CKMB, CKMBINDEX, TROPONINI in the last 168 hours. BNP: No results for input(s): PROBNP in the last 168 hours. D-Dimer: No results for input(s): DDIMER in the last 168 hours. CBG:  Recent Labs Lab 06/07/15 2230 06/08/15 0809 06/08/15 1308  GLUCAP 212* 296* 246*   Hemoglobin A1C: No results for input(s): HGBA1C in the last 168 hours. Fasting Lipid Panel: No results for input(s): CHOL, HDL, LDLCALC, TRIG, CHOLHDL, LDLDIRECT in the last 168 hours. Thyroid Function Tests: No results for input(s): TSH, T4TOTAL, FREET4, T3FREE, THYROIDAB in the last 168 hours. Coagulation: No results for input(s): LABPROT, INR in the last 168 hours. Anemia Panel: No results for input(s): VITAMINB12, FOLATE, FERRITIN, TIBC, IRON, RETICCTPCT in the last 168 hours. Urine Drug Screen: Drugs of Abuse     Component Value Date/Time   LABOPIA NONE DETECTED 12/29/2009 0633   COCAINSCRNUR NONE DETECTED 12/29/2009 0633   LABBENZ NONE DETECTED 12/29/2009 0633   AMPHETMU NONE DETECTED 12/29/2009 0633   THCU NONE DETECTED 12/29/2009 0633   LABBARB  12/29/2009 1517    NONE DETECTED        DRUG SCREEN FOR MEDICAL PURPOSES ONLY.  IF CONFIRMATION IS NEEDED FOR ANY PURPOSE, NOTIFY LAB WITHIN 5 DAYS.        LOWEST DETECTABLE LIMITS FOR URINE DRUG SCREEN Drug Class  Cutoff (ng/mL) Amphetamine      1000 Barbiturate      200 Benzodiazepine   517 Tricyclics       616 Opiates          300 Cocaine          300 THC              50    Alcohol Level: No results for input(s): ETH in the last 168 hours. Urinalysis: No results for input(s): COLORURINE, LABSPEC, PHURINE, GLUCOSEU, HGBUR, BILIRUBINUR, KETONESUR, PROTEINUR, UROBILINOGEN, NITRITE, LEUKOCYTESUR in the last 168 hours.  Invalid input(s): APPERANCEUR   Micro Results: No results found for this or any previous visit (from the past 240 hour(s)). Studies/Results: Dg Chest 2 View  06/07/2015   CLINICAL DATA:  Shortness of breath  and cough worsening over the past week. History of breast cancer.  EXAM: CHEST  2 VIEW  COMPARISON:  06/03/2015 and 05/15/2015  FINDINGS: Patient slightly rotated to the left. Lungs are adequately inflated with no focal consolidation or effusion. Mild stable cardiomegaly. Remainder of the exam is unchanged.  IMPRESSION: No active cardiopulmonary disease.   Electronically Signed   By: Marin Olp M.D.   On: 06/07/2015 17:03   Medications: I have reviewed the patient's current medications. Scheduled Meds: . antiseptic oral rinse  7 mL Mouth Rinse BID  . budesonide-formoterol  2 puff Inhalation BID  . carvedilol  6.25 mg Oral BID WC  . citalopram  30 mg Oral Daily  . diclofenac  75 mg Oral TID  . enoxaparin (LOVENOX) injection  40 mg Subcutaneous Q24H  . insulin aspart  0-15 Units Subcutaneous TID WC  . insulin aspart  4 Units Subcutaneous TID WC  . ipratropium-albuterol  3 mL Nebulization Q6H  . pravastatin  40 mg Oral Daily  . predniSONE  40 mg Oral Q breakfast  . sodium chloride  3 mL Intravenous Q12H   Continuous Infusions:  PRN Meds:.albuterol, guaiFENesin-codeine Assessment/Plan: COPD exacerbation: Patient much improved compared to admission. Still does not feel she is at baseline and will end up right back at hospital if discharged. Wants to ambulate during hospital course. States she and husband are currently smoking less than 1/2 ppd at home. - 40mg  PO prednisone - Continue duonebs, symbicort - Ambulate in hallway with O2 - Continue guaifenesin/codeine - She needs PFTS in outpatient follow up - Will need [facility] appointment resceduled, originally for 8/1 F/U  Diarrhea: No diarrhea today. Was at borderline timing window for C. Diff. consideration but now asymptomatic.  T2DM: Requirements likely increased due to steroid doses. Previously well controlled on metformin, last Hgb A1c 6.1. Glucose in 200s today. On review, previously required 20+ units daily insulin during hospital course  for COPD with steroid treatment. - 4U aspart + SSI-M TIDAC - Holding home metformin during COPD exacerbation  HTN: Home coreg 6.25 BID - Holding home hyzaar 2/2 poor PO intake/diarrhea and bp is not currently elevated  HLD: home pravastatin 40 mg, aspirin 81 mg Depression: home celexa 30 mg QD  FULL CODE FEN Carb modified diet DVT ppx [place] lovenox 40mg   Dispo: Disposition is deferred at this time, awaiting improvement of current medical problems. Anticipated discharge in approximately 1 day(s).   The patient does have a current PCP (Jones Bales, MD) and does need an [facility] hospital follow-up appointment after discharge.  The patient does not know have transportation limitations that hinder transportation to clinic appointments.    Collier Salina, MD  06/08/2015, 2:00 PM

## 2015-06-08 NOTE — Progress Notes (Signed)
Pt. Was able to ambulate in halls w/o needing any oxygen supplement. At rest, pt. O2 sat was 98%. Walking w/o O2, was 97%, but was able to come back up to 100%. No further needs noted at this time.

## 2015-06-08 NOTE — Progress Notes (Signed)
IM Residency Paged regarding patient's request of Ambien for sleep and some pain medication [name] 06/08/2015 12:12 AM

## 2015-06-08 NOTE — Care Management Note (Signed)
Case Management Note  Patient Details  Name: [name] MRN: [id number] Date of Birth: August 26, 1951  Subjective/Objective:                   COPD exacerbation Action/Plan:  Discharge planning Expected Discharge Date:  06/09/15               Expected Discharge Plan:  Home/Self Care  In-House Referral:     Discharge planning Services  CM Consult, Medication Assistance  Post Acute Care Choice:    Choice offered to:     DME Arranged:    DME Agency:     HH Arranged:    [place] Agency:     Status of Service:  Completed, signed off  Medicare Important Message Given:    Date Medicare IM Given:    Medicare IM give by:    Date Additional Medicare IM Given:    Additional Medicare Important Message give by:     If discussed at [place] of Stay Meetings, dates discussed:    Additional Comments: CM met with pt who has had multiple ED visits and admissions for related COPD exacerbations; still actively smoking; pt expresses concern in her inability to pay for her medications (with each visit).  This Cm notes she has been given resources by previous CM for [facility] inhaler; encouraged to quit smoking (she could reallocate this cost to cover medications); encouraged pt  to pursue a medication plan (part D or supplement) to help cover some of the cost of medications for her chronic health needs;  is not eligible for MATCH as she does have Medicare A&B.  This CM gives her [employer] with name and number of a counselor to help her secure insurance and may help to defray cost of health needs depending on her income.  NO other CM needs were communicated. Dellie Catholic, RN 06/08/2015, 3:30 PM

## 2015-06-09 ENCOUNTER — Encounter: Payer: Medicare Other | Admitting: Internal Medicine

## 2015-06-09 DIAGNOSIS — F411 Generalized anxiety disorder: Secondary | ICD-10-CM | POA: Diagnosis not present

## 2015-06-09 DIAGNOSIS — R0602 Shortness of breath: Secondary | ICD-10-CM | POA: Diagnosis not present

## 2015-06-09 DIAGNOSIS — I10 Essential (primary) hypertension: Secondary | ICD-10-CM | POA: Diagnosis not present

## 2015-06-09 DIAGNOSIS — E785 Hyperlipidemia, unspecified: Secondary | ICD-10-CM

## 2015-06-09 DIAGNOSIS — E1165 Type 2 diabetes mellitus with hyperglycemia: Secondary | ICD-10-CM | POA: Diagnosis not present

## 2015-06-09 DIAGNOSIS — F329 Major depressive disorder, single episode, unspecified: Secondary | ICD-10-CM

## 2015-06-09 LAB — GLUCOSE, CAPILLARY
GLUCOSE-CAPILLARY: 197 mg/dL — AB (ref 65–99)
GLUCOSE-CAPILLARY: 369 mg/dL — AB (ref 65–99)
Glucose-Capillary: 201 mg/dL — ABNORMAL HIGH (ref 65–99)
Glucose-Capillary: 214 mg/dL — ABNORMAL HIGH (ref 65–99)

## 2015-06-09 MED ORDER — CITALOPRAM HYDROBROMIDE 40 MG PO TABS
40.0000 mg | ORAL_TABLET | Freq: Every day | ORAL | Status: DC
  Administered 2015-06-10: 40 mg via ORAL
  Filled 2015-06-09 (×2): qty 1

## 2015-06-09 MED ORDER — OXYCODONE-ACETAMINOPHEN 5-325 MG PO TABS: 1.0000 | ORAL_TABLET | Freq: Once | ORAL | Status: DC

## 2015-06-09 MED ORDER — OXYCODONE-ACETAMINOPHEN 5-325 MG PO TABS
1.0000 | ORAL_TABLET | Freq: Once | ORAL | Status: AC
  Administered 2015-06-09: 1 via ORAL
  Filled 2015-06-09: qty 1

## 2015-06-09 MED ORDER — OXYCODONE-ACETAMINOPHEN 5-325 MG PO TABS
1.0000 | ORAL_TABLET | Freq: Four times a day (QID) | ORAL | Status: DC | PRN
Start: 2015-06-09 — End: 2015-06-10
  Administered 2015-06-09 – 2015-06-10 (×4): 1 via ORAL
  Filled 2015-06-09 (×4): qty 1

## 2015-06-09 MED ORDER — CYCLOBENZAPRINE HCL 5 MG PO TABS
5.0000 mg | ORAL_TABLET | Freq: Once | ORAL | Status: AC
Start: 2015-06-09 — End: 2015-06-09
  Administered 2015-06-09: 5 mg via ORAL
  Filled 2015-06-09: qty 1

## 2015-06-09 NOTE — Progress Notes (Signed)
IM Residency paged regarding pt's CBG of 213. No order for insulin received at the moment.

## 2015-06-09 NOTE — Care Management Note (Addendum)
Case Management Note  Patient Details  Name: [name] MRN: [id number] Date of Birth: [birth year]  Subjective/Objective:                 Patient from home. Lives with spouse. States that she has a hard time affording her Spiriva, Simbacort inhalers. CM will look into assistance with these inhalers and provide patient with information prior to discharge if applicable. Patient states she has a nebulizer at home, and does not use oxygen at home.  No other HH needs identified.    Action/Plan:  Will continue to follow.  Patient given Symbacort Rx card. CM filled out information online with patient's permission and printed out card. Card (8x11 print out) given to patient, and photocopy placed on chart. Patient instructed on use. Patient appreciative, no assistance available for Spiriva.   Expected Discharge Date:                  Expected Discharge Plan:  Home/Self Care  In-House Referral:     Discharge planning Services  CM Consult, Medication Assistance  Post Acute Care Choice:    Choice offered to:     DME Arranged:    DME Agency:     HH Arranged:    [place] Agency:     Status of Service:  Completed, signed off  Medicare Important Message Given:    Date Medicare IM Given:    Medicare IM give by:    Date Additional Medicare IM Given:    Additional Medicare Important Message give by:     If discussed at [place] of Stay Meetings, dates discussed:    Additional Comments:  Carles Collet, RN 06/09/2015, 11:26 AM

## 2015-06-09 NOTE — Progress Notes (Signed)
Inpatient Diabetes Program Recommendations  AACE/ADA: New Consensus Statement on Inpatient Glycemic Control (2013)  Target Ranges:  Prepandial:   less than 140 mg/dL      Peak postprandial:   less than 180 mg/dL (1-2 hours)      Critically ill patients:  140 - 180 mg/dL   While here, patient's fasting glucose levels are elevated and continue throughout the day. Addition of basal insulin may be helpful per below.  Inpatient Diabetes Program Recommendations Insulin - Basal: Please consider addition of lantus at 10 units daily or HS-may help improve fasting and thus throughout the day with correction and meal coverage as ordered. Correction (SSI): xxxxxxx  Thank you Rosita Kea, RN, MSN, CDE  Diabetes Inpatient Program Office: [phone] Pager: [phone] 8:00 am to 5:00 pm

## 2015-06-09 NOTE — Progress Notes (Signed)
Patient ID: [name], female   DOB: 09-07-1951, 64 y.o.   MRN: [id number]   Subjective: [name] was standing by the sink coughing when I walked into the room. Through exasperated breaths, she explained to me how doctors have not listened to her in the past when she says she's not ready to go home. Although she wants to go home, she doesn't feel like she's ready because she's afraid she will become short of breath again. She's also complaining of right-sided chest pain that is tender to palpation where she broke some ribs a few years back while at work. Says she feels safe at home but is under a great deal of stress because her husband just recently had surgery.  Objective: Vital signs in last 24 hours: Filed Vitals:   06/08/15 1949 06/08/15 2129 06/09/15 0114 06/09/15 0612  BP:  127/76  147/72  Pulse:  90  65  Temp:  98.3 F (36.8 C)  98 F (36.7 C)  TempSrc:  Oral  Oral  Resp:  18  18  Height:      Weight:      SpO2: 98% 93% 97% 100%   Weight change:   Intake/Output Summary (Last 24 hours) at 06/09/15 0836 Last data filed at 06/09/15 0816  Gross per 24 hour  Intake   1240 ml  Output   1700 ml  Net   -460 ml   General: Sitting over sink coughing up sputum HEENT: No scleral icterus Cardiac: RRR, no rubs, murmurs or gallops Pulm: clear to auscultation bilaterally, moving normal volumes of air. No wheezing. No skin lesions. Right chest wall tender to palpation. Abd: soft, nontender, nondistended, BS present Ext: warm and well perfused, no pedal edema  Studies/Results: Dg Chest 2 View  06/07/2015   CLINICAL DATA:  Shortness of breath and cough worsening over the past week. History of breast cancer.  EXAM: CHEST  2 VIEW  COMPARISON:  06/03/2015 and 05/15/2015  FINDINGS: Patient slightly rotated to the left. Lungs are adequately inflated with no focal consolidation or effusion. Mild stable cardiomegaly. Remainder of the exam is unchanged.  IMPRESSION: No active  cardiopulmonary disease.   Electronically Signed   By: Marin Olp M.D.   On: 06/07/2015 17:03   Medications: I have reviewed the patient's current medications. Scheduled Meds: . antiseptic oral rinse  7 mL Mouth Rinse BID  . budesonide-formoterol  2 puff Inhalation BID  . carvedilol  6.25 mg Oral BID WC  . citalopram  20 mg Oral Daily  . diclofenac  75 mg Oral TID  . enoxaparin (LOVENOX) injection  40 mg Subcutaneous Q24H  . insulin aspart  0-15 Units Subcutaneous TID WC  . insulin aspart  4 Units Subcutaneous TID WC  . ipratropium-albuterol  3 mL Nebulization Q6H  . pravastatin  40 mg Oral Daily  . predniSONE  40 mg Oral Q breakfast  . sodium chloride  3 mL Intravenous Q12H   Continuous Infusions:  PRN Meds:.albuterol, guaiFENesin-codeine   Assessment/Plan:  COPD Exacerbation: She continues to be dyspneic but is satting 100% on room air. There is clearly a great deal of underlying anxiety; I suspect that smokers at home and anxiety is  -Will order pulmonary rehab consult -Will increase celexa from 30mg  to 40mg  -Continue duonebs and symbicort -Continue guafinesin/codeine -No O2 requirement -She will need outpatient PFTs -She will need [facility] appointment  T2DM: Glucose in low 200s, likely elevated from prednisone -4U aspart -Holding metformin  HTN: Well-controlled -Home  coreg 6.25mg  BID -Holding home hyzaar  HLD: -Home pravastatin 40mg  -Aspirin 81mg   Depression: Anxiety is clearly contributing to COPD exacerbation -Will increase celexa from 30mg  to 40mg   Dispo: Disposition is deferred at this time, awaiting improvement of current medical problems.  Anticipated discharge in approximately 1 day(s).   The patient does have a current PCP (Jones Bales, MD) and does need an [facility] hospital follow-up appointment after discharge.  The patient does not have transportation limitations that hinder transportation to clinic appointments.  .Services Needed at time of discharge:  Y = Yes, Blank = No PT:   OT:   RN:   Equipment:   Other:       [name], MD 06/09/2015, 8:36 AM

## 2015-06-09 NOTE — Progress Notes (Signed)
Internal Medicine Attending  Date: 06/09/2015  Patient name: [name] Medical record number: [id number] Date of birth: 12/13/1950 Age: 64 y.o. Gender: female  I saw and evaluated the patient. I reviewed the resident's note by Dr. Melburn Hake and I agree with the resident's findings and plans as documented in his progress note.  We spent about 10 minutes this morning discussing possible issues is to why the [place] is having difficulty with her breathing. She is presumed to have COPD and, if this is the case, it is likely her anxiety is worsening her symptoms. She apparently has significant stress at home as her husband recently had surgery and her daughter and grandson are living with them for financial reasons. She became tearful during her discussion regarding these issues. Examination today reveals decent air movement with only end expiratory wheezes. There is considerable overlying anxiety.  From her description of her shortness of breath it sounds like she may be suffering from hyperdynamic airway compression associated with these spells of anxiety which worsen her air-trapping. She has been to pulmonary rehabilitation in the past, but I am not sure that she utilizes the techniques that were given to her, although she is able to verbalize them. As anxiety is worsening her symptoms through hyperdynamic airway compression we will increase her SSRI therapy to a maximal dose of 40 mg by mouth daily. We will also work on trying to get her Advair for home. With regards to acute bronchodilators she may benefit from DuoNeb's as they are much less expensive than Spiriva and she has a nebulizer at home. She will also require formal pulmonary function tests to assess the degree of her lung dysfunction, if any. Finally, if she is found to have obstructive lung disease she may benefit from another course of pulmonary rehabilitation to teach her strategies to self manage her symptoms.  I anticipate she will be  ready for discharge home in the AM.

## 2015-06-10 DIAGNOSIS — J9819 Other pulmonary collapse: Secondary | ICD-10-CM

## 2015-06-10 DIAGNOSIS — F411 Generalized anxiety disorder: Secondary | ICD-10-CM | POA: Diagnosis not present

## 2015-06-10 LAB — GLUCOSE, CAPILLARY
GLUCOSE-CAPILLARY: 177 mg/dL — AB (ref 65–99)
Glucose-Capillary: 255 mg/dL — ABNORMAL HIGH (ref 65–99)
Glucose-Capillary: 259 mg/dL — ABNORMAL HIGH (ref 65–99)

## 2015-06-10 MED ORDER — CYCLOBENZAPRINE HCL 5 MG PO TABS
5.0000 mg | ORAL_TABLET | Freq: Once | ORAL | Status: AC
  Administered 2015-06-10: 5 mg via ORAL
  Filled 2015-06-10: qty 1

## 2015-06-10 MED ORDER — PREDNISONE 20 MG PO TABS: 40.0000 mg | ORAL_TABLET | Freq: Every day | ORAL | Status: DC

## 2015-06-10 MED ORDER — CITALOPRAM HYDROBROMIDE 20 MG PO TABS: 40.0000 mg | ORAL_TABLET | Freq: Every day | ORAL | Status: DC

## 2015-06-10 NOTE — Discharge Summary (Signed)
Name: [name] MRN: [id number] DOB: Feb 11, 1951 64 y.o. PCP: Marrian Salvage, MD  Date of Admission: 06/07/2015  3:45 PM Date of Discharge: 06/10/2015 Attending Physician: Doneen Poisson, MD  Discharge Diagnosis: 1. COPD exacerbation  Discharge Medications:   Medication List    ASK your doctor about these medications        ACCU-CHEK FASTCLIX LANCETS Misc  1 each by Other route See admin instructions. Check blood sugar daily as needed for high blood sugar.     ACCU-CHEK NANO SMARTVIEW W/DEVICE Kit  1 each by Other route See admin instructions. Check blood sugar daily as needed for high blood sugar.     acetaminophen 325 MG tablet  Commonly known as:  TYLENOL  Take 650 mg by mouth 2 (two) times daily as needed for headache.     albuterol 108 (90 BASE) MCG/ACT inhaler  Commonly known as:  VENTOLIN HFA  Inhale 2 puffs into the lungs every 6 (six) hours as needed for wheezing or shortness of breath.     albuterol (2.5 MG/3ML) 0.083% nebulizer solution  Commonly known as:  PROVENTIL  Take 3 mLs (2.5 mg total) by nebulization every 6 (six) hours as needed for wheezing or shortness of breath.     budesonide-formoterol 160-4.5 MCG/ACT inhaler  Commonly known as:  SYMBICORT  Inhale 2 puffs into the lungs 2 (two) times daily.     carvedilol 6.25 MG tablet  Commonly known as:  COREG  take 1 tablet by mouth twice a day with meals     citalopram 20 MG tablet  Commonly known as:  CELEXA  Take 1.5 tablets (30 mg total) by mouth daily.     cyclobenzaprine 5 MG tablet  Commonly known as:  FLEXERIL  Take 1 tablet (5 mg total) by mouth at bedtime as needed for muscle spasms.     glucose blood test strip  Commonly known as:  ACCU-CHEK SMARTVIEW  Use to check blood sugar 1 to 2 times daily. diag code E 11.9. Non- insulin dependent     guaiFENesin-codeine 100-10 MG/5ML syrup  Take 5 mLs by mouth every 4 (four) hours as needed for cough.     losartan-hydrochlorothiazide  100-25 MG per tablet  Commonly known as:  HYZAAR  Take 1 tablet by mouth daily.     metFORMIN 1000 MG tablet  Commonly known as:  GLUCOPHAGE  Take 1 tablet (1,000 mg total) by mouth 2 (two) times daily with a meal.     pravastatin 40 MG tablet  Commonly known as:  PRAVACHOL  TAKE 1 TABLET BY MOUTH DAILY     sodium chloride 0.65 % Soln nasal spray  Commonly known as:  OCEAN  Place 2 sprays into both nostrils 3 (three) times daily.        Disposition and follow-up:   [name] was discharged from [facility] in Good condition.  At the hospital follow up visit please address:  1.  Please address her anxiety given this is the likely etiology of her COPD exacerbations.  2.  Labs / imaging needed at time of follow-up: Pulmonary function tests  3.  Pending labs/ test needing follow-up: None.  Follow-up Appointments: [facility] with Dr. Delane Ginger on 8/4 at 2:15pm  Procedures Performed:  Dg Chest 2 View  06/07/2015   CLINICAL DATA:  Shortness of breath and cough worsening over the past week. History of breast cancer.  EXAM: CHEST  2 VIEW  COMPARISON:  06/03/2015 and  05/15/2015  FINDINGS: Patient slightly rotated to the left. Lungs are adequately inflated with no focal consolidation or effusion. Mild stable cardiomegaly. Remainder of the exam is unchanged.  IMPRESSION: No active cardiopulmonary disease.   Electronically Signed   By: Elberta Fortis M.D.   On: 06/07/2015 17:03   Dg Chest 2 View  06/03/2015   CLINICAL DATA:  Shortness of breath, chest pain  EXAM: CHEST - 2 VIEW  COMPARISON:  05/15/2015  FINDINGS: Lungs are clear, hyperinflated. Heart size upper limits normal. The mediastinal contours are within normal limits. No effusion. No pneumothorax. Vascular clips right axilla. Old right rib fractures. Cervical fixation hardware.  Spurring in the mid thoracic spine.  IMPRESSION: No acute cardiopulmonary disease.   Electronically Signed   By: Corlis Leak M.D.   On:  06/03/2015 20:03   Dg Chest 2 View (if Patient Has Fever And/or Copd)  05/15/2015   CLINICAL DATA:  Shortness of breath and cough  EXAM: CHEST  2 VIEW  COMPARISON:  05/08/2015  FINDINGS: Chronic hyperinflation and interstitial coarsening consistent with COPD. There is no edema, consolidation, effusion, or pneumothorax. Normal heart size and stable aortic contours.  Remote right axillary dissection.  Lower cervical discectomy.  IMPRESSION: COPD without acute superimposed disease.   Electronically Signed   By: [name] M.D.   On: 05/15/2015 06:00    Admission HPI: [name] is a 64 year old woman with extensive PMH of presumed COPD with multiple exacerbations, NIDDM, HTN, history of breast cancer s/p surgeries in 1991, and HLD who is here for continued SOB, cough and wheezing with no improvement since being seen in the ER on 6/27. THis has been an ongoing issue for many months now.  SHe was seen by Dr. Delane Ginger on 6/6 in the clinic after Pt had completed prednisone and azithromycin for 5 days and she was having continued "sinus infection" and wheezing.   She was admitted on 7/7-7/9 with COPD exacerbation, and was given Solu-Medrol and nebulizer treatments.No antibiotics were prescribed. Was discharged on prednisone 40 mg daily for 5 days for home, which pt says she completed.  At the time of follow up on 7/15, she reported completing her course of steroids and did not complain of persistent symptoms. She also reported feeling confused about her medication regimen and was referred to Dr.Kim for med reconciliation which Pt. hasn't done yet.  She returned with similar symptoms in the ER on 7/27, was seen by Dr. Delane Ginger who is her PCP who did not feel she needed an admission and was asked to come back to clinic this Monday 8/1. She improved with albuterol, ipratropium and solumedrol.  She presents today with similar symptoms. At home, she is on albuterol, symbicort, and it has not helped and feels  SOB. She was prescribed but can not afford spiriva. She is not on oxygen at home. She continues to smoke 1 ppd , but has not since 3 days. Her husband and daughter also smoke in the house. She has had PFTs ordered in Jan and in June, but was not able to undergo them. Per chart review, She has been intubated in Nov 2015. Her SOB is associated cough productive of white sputum. She denied any fevers, chills or any other constitutional symptoms, or any sick contacts, She did mention some chest pain over her ribs which she has been having for few weeks now- it was also present last time she was here and EKG was normal. She also mentioned that  she has been having some diarrhea (x4 today), and off and on for the last few months. There is recent history of both clindamycin (6/14 for insect bite) and azithromycin use.  In the ER today , she was afebrile, and had completed continuous albuterol nebulizer and Solumedrol 125 mg IV. Her CXR was unremarkable.  She continued to wheeze with Sp02 being 89%, then was given 2L O2, and IMTS was called for admission.   Hospital Course by problem list: Principal Problem:   Acute respiratory disease Active Problems:   Diarrhea   SOB (shortness of breath)   Generalized anxiety disorder   1. COPD exacerbation: Pt originally presented with dyspnea and cough, satting 89% on 2 liters of Oxygen on 7/30. She was given 125mg  Solu-Medrol in the ED then changes to 40mg  Prednisone BID. No antibiotics were prescribed. She also complained of right rib pain associated with coughing that we treated with 5/325/ Percocet PRN. The first day after admission she was speaking in full sentences without dyspnea. There is serious underlying anxiety, perhaps related to smoking cessation. Her Celexa was increased from 30mg  to 40mg  daily. She is currently working on quitting smoking with her husband.  2. Diarrhea She developed diarrhea on days 2-3 of admission. She had been prescribed  Clindamycin in the last month so a C diff PCR was ordered; that was negative. Her diarrhea resolved spontaneously.  3. Type 2 Diabetes Mellitus Mildly elevated in 200s, on 4U Lantus, likely from Prednisone  4. Hypertension Well-controlled on carvedilol 6.25mg  BID. We held her home Hyzaar.  5. Hyperlipidemia We continued her home pravastatin 40mg  and Aspirin 81mg   Discharge Vitals:   BP 141/65 mmHg  Pulse 84  Temp(Src) 98.2 F (36.8 C) (Oral)  Resp 16  Ht 5\' 7"  (1.702 m)  Wt 99.4 kg (219 lb 2.2 oz)  BMI 34.31 kg/m2  SpO2 98%  Discharge Labs:  Results for orders placed or performed during the hospital encounter of 06/07/15 (from the past 24 hour(s))  Glucose, capillary     Status: Abnormal   Collection Time: 06/09/15  4:33 PM  Result Value Ref Range   Glucose-Capillary 369 (H) 65 - 99 mg/dL  Glucose, capillary     Status: Abnormal   Collection Time: 06/09/15 11:34 PM  Result Value Ref Range   Glucose-Capillary 201 (H) 65 - 99 mg/dL   Comment 1 Notify RN    Comment 2 Document in Chart   Glucose, capillary     Status: Abnormal   Collection Time: 06/10/15  8:13 AM  Result Value Ref Range   Glucose-Capillary 177 (H) 65 - 99 mg/dL  Glucose, capillary     Status: Abnormal   Collection Time: 06/10/15 11:46 AM  Result Value Ref Range   Glucose-Capillary 255 (H) 65 - 99 mg/dL    Signed: Selina Cooley, MD 06/10/2015, 2:00 PM

## 2015-06-10 NOTE — Progress Notes (Signed)
[name] was seen on rounds this AM with the housestaff.  She felt well from a breathing standpoint and verbalized readiness for discharge home.  Examination of the lungs was clear w/o wheezing.  The importance of management of her anxiety in the context of her COPD was discussed once again.  She was set up with outpatient pulmonary rehab and was encouraged to attend.  She will require outpatient PFTs.  [facility] be [facility].

## 2015-06-10 NOTE — Discharge Instructions (Signed)
Mrs. Ryland,  It was a pleasure to meet you but I'm sorry it had to be under such circumstances.  Although we highly suspect you have COPD, you will need "pulmonary function tests" in order to confirm the diagnosis and help classify the severity of your disease.  Please remember to use your inhaler whenever you have wheezing.  As we discussed, anxiety and COPD go hand-in-hand; one can worsen the other. I increased your Celexa to 40mg  daily but this will take a few weeks to take effect.  Otherwise, rest up and I'll get you a follow-up appointment in the clinic to see Dr. Gordy Levan.  Take care, Dr. Melburn Hake

## 2015-06-10 NOTE — Progress Notes (Signed)
Patient discharge teaching given, including activity, diet, follow up appointment, and mediations. Patient verbalized understanding of all discharge instructions. IV access was discontinued. Vitals are stable, Skin intact except as charted in most recent assessments. Patient to be escorted out by RN, to be driven home by family.

## 2015-06-12 ENCOUNTER — Ambulatory Visit: Payer: Medicare Other | Admitting: Internal Medicine

## 2015-06-13 ENCOUNTER — Encounter: Payer: Self-pay | Admitting: Internal Medicine

## 2015-06-13 ENCOUNTER — Ambulatory Visit (INDEPENDENT_AMBULATORY_CARE_PROVIDER_SITE_OTHER): Payer: Medicare Other | Admitting: Internal Medicine

## 2015-06-13 ENCOUNTER — Encounter: Payer: Self-pay | Admitting: Pharmacist

## 2015-06-13 ENCOUNTER — Ambulatory Visit: Payer: Medicare Other | Admitting: Pharmacist

## 2015-06-13 VITALS — BP 155/76 | HR 85 | Temp 98.4°F | Ht 67.0 in | Wt 225.0 lb

## 2015-06-13 DIAGNOSIS — F17210 Nicotine dependence, cigarettes, uncomplicated: Secondary | ICD-10-CM | POA: Diagnosis not present

## 2015-06-13 DIAGNOSIS — F418 Other specified anxiety disorders: Secondary | ICD-10-CM | POA: Diagnosis not present

## 2015-06-13 DIAGNOSIS — Z7951 Long term (current) use of inhaled steroids: Secondary | ICD-10-CM | POA: Diagnosis not present

## 2015-06-13 DIAGNOSIS — J449 Chronic obstructive pulmonary disease, unspecified: Secondary | ICD-10-CM | POA: Diagnosis not present

## 2015-06-13 DIAGNOSIS — I10 Essential (primary) hypertension: Secondary | ICD-10-CM

## 2015-06-13 DIAGNOSIS — Z853 Personal history of malignant neoplasm of breast: Secondary | ICD-10-CM | POA: Diagnosis not present

## 2015-06-13 DIAGNOSIS — J441 Chronic obstructive pulmonary disease with (acute) exacerbation: Secondary | ICD-10-CM

## 2015-06-13 MED ORDER — TIOTROPIUM BROMIDE MONOHYDRATE 18 MCG IN CAPS: ORAL_CAPSULE | RESPIRATORY_TRACT | Status: DC

## 2015-06-13 MED ORDER — CITALOPRAM HYDROBROMIDE 40 MG PO TABS: 40.0000 mg | ORAL_TABLET | Freq: Every day | ORAL | Status: DC

## 2015-06-13 NOTE — Progress Notes (Signed)
S:  [name] is a 64 y.o. female reports to clinical pharmacist appointment for assistance with COPD medication management. Patient did not bring inhaled medications. Patient is accompanied by spouse, who assist at home with medications.   COPD medications  Current: budesonide-formoterol, albuterol (inhaler and nebulizer solution)  Tried in past: fluticasone-salmeterol, ipratropium-albuterol (Duoneb solution)   Allergies   Allergen  Reactions   .  Ace Inhibitors  Swelling     Throat swelling.   [name] [Fluticasone]  Other (See Comments)     Sinuses stop up and condition worsens    A/P:  Spirometry results pending, patient may benefit from addition of Spriva. Patient reports concerns with cost but may be able to afford the Spiriva.   Medication Samples have been provided to the patient.  Drug: Spiriva Strength: 18 mcg Qty: 1 LOT: [id number] Exp.Date: August 2016   The patient has been instructed regarding the correct time, dose, and frequency of taking this medication, including desired effects and most common side effects. Patient was able to correctly demonstrate inhaler technique and understanding of information by repeating back concepts discussed.  Approved by Drs. Gill and [place]   Follow-up  Patient advised to contact me if further medication assistance needed.   [name]  Clinical Pharmacist   15 minutes spent face-to-face with the patient during the encounter. 50% of time spent on education. 50% of time was spent on assessment and plan.

## 2015-06-13 NOTE — Patient Instructions (Addendum)
Thank you for your visit today.   Please return to the internal medicine clinic in 2-3 months or sooner if needed.     I have made the following additions/changes to your medications: You may take 40mg  of celexa.   Please continue your other medications prescribed at discharge.    Please be sure to bring all of your medications with you to every visit; this includes herbal supplements, vitamins, eye drops, and any over-the-counter medications.   Should you have any questions regarding your medications and/or any new or worsening symptoms, please be sure to call the clinic at [phone].   If you believe that you are suffering from a life threatening condition or one that may result in the loss of limb or function, then you should call 911 or proceed to the nearest Emergency Department.

## 2015-06-13 NOTE — Patient Instructions (Signed)
MY COPD ACTION PLAN Name: __________________________________ Date: ________________ Doctor Name: ____________________________ Phone#: _____________ Emergency Contact Name: _________________ Phone #: _____________  HOW I AM FEELING: WHAT I NEED TO DO:  I am doing well . Breathing well . Performing daily tasks . Thinking clearly . Clearing mucus easily if I cough . Getting sleep . Keeping a good diet . Exercising as advised by my doctor . Take your daily medicines to control your COPD. Marland Kitchen Practice breathing exercises and relaxing activities. . Avoid doing things that make you feel worse. . Stay up-to-date with your immunizations.  I feel worse from my COPD from one or more of the following symptoms: . Shortness of breath . Difficulty completing daily tasks . More coughing or wheezing . Thicker and colored mucus . Fever . Trouble concentrating . Trouble sleeping . Decreased appetite . Continue your daily medicines to control your COPD.  Marland Kitchen Record in your journal and tell your doctor. . Use your rescue inhaler as prescribed. . If your doctor prescribed oxygen, use as directed. . If you live alone, call a neighbor, friend, or relative to let them know you feel worse. Marland Kitchen Avoid doing things that make you feel worse. . Practice breathing exercises and relaxing activities.  I feel I am in danger from any of the following: . Severe shortness of breath (I feel like I cannot breathe) . Trouble coughing up mucus, coughing frequently . Blood in mucus . Chest pain . Confused, slurred speech . Feel faint . Rescue medicine is not helping . Fever and chills Take your rescue medicine and call 911 or your emergency medical services!  Also refer to the advice above in yellow.

## 2015-06-13 NOTE — Progress Notes (Signed)
S:    [name] is a 64 y.o. female reports to clinical pharmacist appointment for assistance with COPD medication management. Patient did not bring inhaled medications. Patient is accompanied by spouse, who assist at home with medications.  COPD medications  Current: budesonide-formoterol, albuterol (inhaler and nebulizer solution)  Tried in past: fluticasone-salmeterol, ipratropium-albuterol (Duoneb solution)    Allergies  Allergen Reactions  . Ace Inhibitors Swelling    Throat swelling.  [name] [Fluticasone] Other (See Comments)    Sinuses stop up and condition worsens   A/P: Spirometry results pending, patient may benefit from addition of Spriva. Patient reports concerns with cost but may be able to afford the Spiriva.   Medication Samples have been provided to the patient.  Drug: Spiriva Strength: 18 mcg Qty: 1 LOT: [id number]  Exp.Date: August 2016  The patient has been instructed regarding the correct time, dose, and frequency of taking this medication, including desired effects and most common side effects.   Approved by Drs. [name] and [name]  [name] 5:21 PM 06/13/2015  Follow-up Patient advised to contact me if further medication assistance needed.  [name] Clinical Pharmacist  15 minutes spent face-to-face with the patient during the encounter. 50% of time spent on education. 50% of time was spent on assessment and plan.

## 2015-06-14 NOTE — Addendum Note (Signed)
Addended by: Forde Dandy on: 06/14/2015 03:58 PM   Modules accepted: Orders, Medications

## 2015-06-15 NOTE — Assessment & Plan Note (Signed)
-  cont current meds 

## 2015-06-15 NOTE — Assessment & Plan Note (Addendum)
Pt just d/c for ?AECOPD.  I feel however, his is more related to underlying anxiety disorder.  She reports being SOB although her SpO2 is 100% and she has no wheezing on exam.  I have counseled her on trying to see a mental health professional but she has declined.  She always becomes tearful during our OV.  She has an adult daughter who is living with her now and has put a strain on her.  I feel counseling would be beneficial to her.  I also wonder if she is actually taking her inhalers as prescribed.  She is also followed by [facility].  For some reason, spiriva fell of her med list and she should be taking this.  She has not had PFTs because she keeps getting readmitted.  Dr. Maudie Mercury was able to obtain a free sample of her meds.  -referral to Dr. Maudie Mercury for medication mgmt (appreciate Dr. Julianne Rice help with this)  -referral to Golden Hurter (CSW) again for help with obtaining counseling services  -PFTs in 6 weeks  -cont saline nasal spray several times daily  -f/u in 2 months  -have [facility] f/u

## 2015-06-15 NOTE — Progress Notes (Signed)
Patient ID: [name], female   DOB: July 29, 1951, 64 y.o.   MRN: [id number]     Subjective:   [name]    DOB: Nov 09, 1950 64 y.o.    MRN: [id number] Health Maintenance Due: Health Maintenance Due  Topic Date Due  . COLONOSCOPY  12/30/2000  . MAMMOGRAM  12/04/2014  . LIPID PANEL  06/15/2015  . INFLUENZA VACCINE  06/09/2015    _________________________________________________  HPI: [name] is a 64 y.o. female here for an acute visit.  Pt has a PMH outlined below.  Please see problem-based charting assessment and plan note for further details of medical issues addressed at today's visit.  PMH: Past Medical History  Diagnosis Date  . Hyperlipidemia   . Hypertension   . Tobacco abuse   . COPD (chronic obstructive pulmonary disease)     History of multiple hospital admissions for exercabation   . Asthma   . Breast cancer 1991    s/p lumpectomy, chemotherapy and radiation therapy in 1991. Mammogram in 2007 was normal.  . Sigmoid diverticulitis 80/2008  . Anxiety   . Depression   . Obesity   . GERD (gastroesophageal reflux disease)   . Heart murmur 10/05/11    "first time I ever heard I had one was today"  . Pneumonia   . Shortness of breath 10/05/11    "at rest; lying down; w/exertion"  . Diabetes mellitus   . Bronchitis     h/o  . Diarrhea     h/o  . Constipated     h/o  . COPD with exacerbation 04/06/2009    Qualifier: Diagnosis of  By: Eyvonne Mechanic MD, Vijay      Medications: Current Outpatient Prescriptions on File Prior to Visit  Medication Sig Dispense Refill  . ACCU-CHEK FASTCLIX LANCETS MISC 1 each by Other route See admin instructions. Check blood sugar daily as needed for high blood sugar.    Marland Kitchen acetaminophen (TYLENOL) 325 MG tablet Take 650 mg by mouth 2 (two) times daily as needed for headache.    . albuterol (PROVENTIL) (2.5 MG/3ML) 0.083% nebulizer solution Take 3 mLs (2.5 mg total) by nebulization every 6  (six) hours as needed for wheezing or shortness of breath. 360 mL 3  . albuterol (VENTOLIN HFA) 108 (90 BASE) MCG/ACT inhaler Inhale 2 puffs into the lungs every 6 (six) hours as needed for wheezing or shortness of breath. 1 Inhaler 3  . Blood Glucose Monitoring Suppl (ACCU-CHEK NANO SMARTVIEW) W/DEVICE KIT 1 each by Other route See admin instructions. Check blood sugar daily as needed for high blood sugar.    . budesonide-formoterol (SYMBICORT) 160-4.5 MCG/ACT inhaler Inhale 2 puffs into the lungs 2 (two) times daily. 1 Inhaler 12  . carvedilol (COREG) 6.25 MG tablet take 1 tablet by mouth twice a day with meals 60 tablet 3  . glucose blood (ACCU-CHEK SMARTVIEW) test strip Use to check blood sugar 1 to 2 times daily. diag code E 11.9. Non- insulin dependent 100 each 6  . guaiFENesin-codeine 100-10 MG/5ML syrup Take 5 mLs by mouth every 4 (four) hours as needed for cough. 120 mL 0  . losartan-hydrochlorothiazide (HYZAAR) 100-25 MG per tablet Take 1 tablet by mouth daily. 30 tablet 2  . metFORMIN (GLUCOPHAGE) 1000 MG tablet Take 1 tablet (1,000 mg total) by mouth 2 (two) times daily with a meal. 60 tablet 11  . pravastatin (PRAVACHOL) 40 MG tablet TAKE 1 TABLET BY MOUTH DAILY (Patient taking differently: TAKE  1 TABLET BY MOUTH DAILY AT BEDTIME) 90 tablet 0  . sodium chloride (OCEAN) 0.65 % SOLN nasal spray Place 2 sprays into both nostrils 3 (three) times daily. (Patient taking differently: Place 1 spray into both nostrils 3 (three) times daily. ) 15 mL 3  . [DISCONTINUED] albuterol (PROVENTIL,VENTOLIN) 90 MCG/ACT inhaler Inhale 2 puffs into the lungs every 6 (six) hours as needed for wheezing. 17 g 12   No current facility-administered medications on file prior to visit.    Allergies: Allergies  Allergen Reactions  . Ace Inhibitors Swelling    Throat swelling.  [name] [Fluticasone] Other (See Comments)    Sinuses stop up and condition worsens    FH: Family History  Problem Relation Age  of Onset  . Cancer Mother     SH: History   Social History  . Marital Status: Married    Spouse Name: N/A  . Number of Children: N/A  . Years of Education: 12   Occupational History  .  Unemployed   Social History Main Topics  . Smoking status: Current Some Day Smoker -- 0.10 packs/day for 40 years    Types: Cigarettes    Last Attempt to Quit: 09/11/2014  . Smokeless tobacco: Never Used     Comment: slowly quitting.DOWN TO ABOUT 6 CIGARETTES PER DAY  . Alcohol Use: 2.4 oz/week    4 Cans of beer per week     Comment: "only on the weekends"  . Drug Use: No  . Sexual Activity: Not on file   Other Topics Concern  . None   Social History Narrative   Lives in [place] with her husband.   Takes care of 3 grand children.   Trying to find a job, has financial difficulties.          Review of Systems: Constitutional: Negative for fever, chills and weight loss.  Eyes: Negative for blurred vision.  Respiratory: Negative for cough and +shortness of breath.  Cardiovascular: Negative for chest pain, palpitations and leg swelling.  Gastrointestinal: Negative for nausea, vomiting, abdominal pain, diarrhea, constipation and blood in stool.  Genitourinary: Negative for dysuria, urgency and frequency.  Musculoskeletal: Negative for myalgias and back pain.  Neurological: Negative for dizziness, weakness and headaches.     Objective:   Vital Signs: Filed Vitals:   06/13/15 1552  BP: 155/76  Pulse: 85  Temp: 98.4 F (36.9 C)  TempSrc: Oral  Height: '5\' 7"'  (1.702 m)  Weight: 225 lb (102.059 kg)  SpO2: 100%      BP Readings from Last 3 Encounters:  06/13/15 155/76  06/10/15 142/68  06/04/15 148/61    Physical Exam: Constitutional: Vital signs reviewed.  Patient is in NAD and cooperative with exam.  Head: Normocephalic and atraumatic. Eyes: EOMI, conjunctivae nl, no scleral icterus.  Neck: Supple. Cardiovascular: RRR, no MRG. Pulmonary/Chest: normal effort, CTAB, no  wheezes, rales, or rhonchi. Abdominal: Soft. NT/ND +BS. Neurological: A&O x3, cranial nerves II-XII are grossly intact, moving all extremities. Extremities: 2+DP b/l; no pitting edema. Skin: Warm, dry and intact. No rash.   Assessment & Plan:   Assessment and plan was discussed and formulated with my attending.

## 2015-06-15 NOTE — Assessment & Plan Note (Signed)
-  needs repeat mammogram which we have discussed numerous times but she is afraid of going

## 2015-06-15 NOTE — Assessment & Plan Note (Signed)
>>  ASSESSMENT AND PLAN FOR PERSONAL HISTORY OF BREAST CANCER WRITTEN ON 06/15/2015  5:07 PM BY Jones Bales, MD  -needs repeat mammogram which we have discussed numerous times but she is afraid of going

## 2015-06-15 NOTE — Assessment & Plan Note (Signed)
>>  ASSESSMENT AND PLAN FOR CHRONIC OBSTRUCTIVE PULMONARY DISEASE WITH BRONCHOSPASM ([employer]) WRITTEN ON 06/15/2015  5:08 PM BY GILL, Toma Deiters, MD  Pt just d/c for ?AECOPD.  I feel however, his is more related to underlying anxiety disorder.  She reports being SOB although her SpO2 is 100% and she has no wheezing on exam.  I have counseled her on trying to see a mental health professional but she has declined.  She always becomes tearful during our OV.  She has an adult daughter who is living with her now and has put a strain on her.  I feel counseling would be beneficial to her.  I also wonder if she is actually taking her inhalers as prescribed.  She is also followed by [facility].  For some reason, spiriva fell of her med list and she should be taking this.  She has not had PFTs because she keeps getting readmitted.  Dr. Maudie Mercury was able to obtain a free sample of her meds.  -referral to Dr. Maudie Mercury for medication mgmt (appreciate Dr. Julianne Rice help with this)  -referral to Golden Hurter (CSW) again for help with obtaining counseling services  -PFTs in 6 weeks  -cont saline nasal spray several times daily  -f/u in 2 months  -have [facility] f/u

## 2015-06-15 NOTE — Assessment & Plan Note (Signed)
She wonders if she should be taking 40mg  celexa and I told her yes.  Anxiety/depression is playing a large role in her disease process. -referral back to CSW -cont meds

## 2015-06-15 NOTE — Assessment & Plan Note (Signed)
>>  ASSESSMENT AND PLAN FOR GAD (GENERALIZED ANXIETY DISORDER) WRITTEN ON 06/15/2015  5:04 PM BY GILL, Toma Deiters, MD  She wonders if she should be taking 40mg  celexa and I told her yes.  Anxiety/depression is playing a large role in her disease process. -referral back to CSW -cont meds

## 2015-06-16 ENCOUNTER — Telehealth (HOSPITAL_COMMUNITY): Payer: Self-pay

## 2015-06-16 NOTE — Progress Notes (Signed)
Pt aware of appt for PFT at [facility] 06/24/15 11AM and also info of appt mailed to pt. Hilda Blades Satia Winger RN 06/16/15 10:15AM

## 2015-06-16 NOTE — Telephone Encounter (Signed)
I have called and left a message with [name] to inquire about participation in Pulmonary Rehab per Dr. Melburn Hake referral. Will send letter in mail and follow up.

## 2015-06-16 NOTE — Progress Notes (Signed)
Internal Medicine Clinic Attending  Case discussed with Dr. Gill soon after the resident saw the patient.  We reviewed the resident's history and exam and pertinent patient test results.  I agree with the assessment, diagnosis, and plan of care documented in the resident's note.  

## 2015-06-17 ENCOUNTER — Telehealth (HOSPITAL_COMMUNITY): Payer: Self-pay | Admitting: *Deleted

## 2015-06-18 NOTE — Patient Outreach (Signed)
[facility]) Care Management  06/18/2015  SULEYMA WAFER 1951-04-26 [id number]   Request from Natividad Brood, RN to assign Community RN to follow up after recent hospital admission.  Thanks, Ronnell Freshwater. [place], [place] Assistant Phone: [phone] Fax: [phone]

## 2015-06-20 ENCOUNTER — Encounter (HOSPITAL_COMMUNITY): Payer: Self-pay | Admitting: Emergency Medicine

## 2015-06-20 ENCOUNTER — Emergency Department (HOSPITAL_COMMUNITY): Payer: Medicare Other

## 2015-06-20 ENCOUNTER — Observation Stay (HOSPITAL_COMMUNITY)
Admission: EM | Admit: 2015-06-20 | Discharge: 2015-06-23 | Disposition: A | Discharge status: Home / Self Care | Payer: Medicare Other | Attending: Internal Medicine | Admitting: Internal Medicine

## 2015-06-20 ENCOUNTER — Emergency Department (INDEPENDENT_AMBULATORY_CARE_PROVIDER_SITE_OTHER)
Admission: EM | Admit: 2015-06-20 | Discharge: 2015-06-20 | Disposition: A | Discharge status: Nursing Home (Includes ICF) | Payer: Medicare Other | Source: Home / Self Care | Attending: Family Medicine | Admitting: Family Medicine

## 2015-06-20 ENCOUNTER — Encounter (HOSPITAL_COMMUNITY): Payer: Self-pay | Admitting: *Deleted

## 2015-06-20 ENCOUNTER — Other Ambulatory Visit: Payer: Self-pay | Admitting: *Deleted

## 2015-06-20 DIAGNOSIS — F411 Generalized anxiety disorder: Secondary | ICD-10-CM | POA: Insufficient documentation

## 2015-06-20 DIAGNOSIS — F329 Major depressive disorder, single episode, unspecified: Secondary | ICD-10-CM | POA: Insufficient documentation

## 2015-06-20 DIAGNOSIS — E669 Obesity, unspecified: Secondary | ICD-10-CM | POA: Diagnosis not present

## 2015-06-20 DIAGNOSIS — I10 Essential (primary) hypertension: Secondary | ICD-10-CM | POA: Insufficient documentation

## 2015-06-20 DIAGNOSIS — R0600 Dyspnea, unspecified: Secondary | ICD-10-CM

## 2015-06-20 DIAGNOSIS — E785 Hyperlipidemia, unspecified: Secondary | ICD-10-CM | POA: Insufficient documentation

## 2015-06-20 DIAGNOSIS — R000 Tachycardia, unspecified: Secondary | ICD-10-CM | POA: Diagnosis not present

## 2015-06-20 DIAGNOSIS — E119 Type 2 diabetes mellitus without complications: Secondary | ICD-10-CM | POA: Insufficient documentation

## 2015-06-20 DIAGNOSIS — R05 Cough: Secondary | ICD-10-CM | POA: Diagnosis not present

## 2015-06-20 DIAGNOSIS — J449 Chronic obstructive pulmonary disease, unspecified: Secondary | ICD-10-CM

## 2015-06-20 DIAGNOSIS — Z853 Personal history of malignant neoplasm of breast: Secondary | ICD-10-CM | POA: Diagnosis not present

## 2015-06-20 DIAGNOSIS — J441 Chronic obstructive pulmonary disease with (acute) exacerbation: Secondary | ICD-10-CM

## 2015-06-20 DIAGNOSIS — K219 Gastro-esophageal reflux disease without esophagitis: Secondary | ICD-10-CM | POA: Diagnosis not present

## 2015-06-20 DIAGNOSIS — F17210 Nicotine dependence, cigarettes, uncomplicated: Secondary | ICD-10-CM | POA: Insufficient documentation

## 2015-06-20 DIAGNOSIS — R0602 Shortness of breath: Secondary | ICD-10-CM | POA: Diagnosis not present

## 2015-06-20 LAB — BASIC METABOLIC PANEL
Anion gap: 13 (ref 5–15)
BUN: 7 mg/dL (ref 6–20)
CALCIUM: 9.6 mg/dL (ref 8.9–10.3)
CO2: 25 mmol/L (ref 22–32)
Chloride: 99 mmol/L — ABNORMAL LOW (ref 101–111)
Creatinine, Ser: 0.88 mg/dL (ref 0.44–1.00)
GFR calc Af Amer: >60 mL/min (ref >60)
GLUCOSE: 123 mg/dL — AB (ref 65–99)
Potassium: 3.4 mmol/L — ABNORMAL LOW (ref 3.5–5.1)
SODIUM: 137 mmol/L (ref 135–145)

## 2015-06-20 LAB — I-STAT TROPONIN, ED: TROPONIN I, POC: 0.01 ng/mL (ref 0.00–0.08)

## 2015-06-20 LAB — CBC
HCT: 36.3 % (ref 36.0–46.0)
Hemoglobin: 12.4 g/dL (ref 12.0–15.0)
MCH: 29.2 pg (ref 26.0–34.0)
MCHC: 34.2 g/dL (ref 30.0–36.0)
MCV: 85.4 fL (ref 78.0–100.0)
PLATELETS: 358 10*3/uL (ref 150–400)
RBC: 4.25 MIL/uL (ref 3.87–5.11)
RDW: 13.7 % (ref 11.5–15.5)
WBC: 10.6 10*3/uL — AB (ref 4.0–10.5)

## 2015-06-20 MED ORDER — IPRATROPIUM BROMIDE 0.02 % IN SOLN
0.5000 mg | Freq: Once | RESPIRATORY_TRACT | Status: AC
Start: 2015-06-20 — End: 2015-06-20
  Administered 2015-06-20: 0.5 mg via RESPIRATORY_TRACT
  Filled 2015-06-20: qty 2.5

## 2015-06-20 MED ORDER — ALBUTEROL SULFATE (2.5 MG/3ML) 0.083% IN NEBU
5.0000 mg | INHALATION_SOLUTION | Freq: Once | RESPIRATORY_TRACT | Status: AC
  Administered 2015-06-20: 5 mg via RESPIRATORY_TRACT
  Filled 2015-06-20: qty 6

## 2015-06-20 MED ORDER — PREDNISONE 20 MG PO TABS
60.0000 mg | ORAL_TABLET | Freq: Once | ORAL | Status: AC
  Administered 2015-06-20: 60 mg via ORAL
  Filled 2015-06-20: qty 3

## 2015-06-20 NOTE — ED Notes (Addendum)
C/o sob since yesterday and productive cough with clear sputum since this morning.  Reports R sided rib pain that pt relates to coughing and previous broken rib.  Taking cough medication and inhalers without relief.  Recent hospital admission for COPD.

## 2015-06-20 NOTE — ED Notes (Signed)
PA Kaitlyn at bedside.

## 2015-06-20 NOTE — ED Notes (Signed)
Patient stated she was discharged about 1 week ago  Patient very SOB

## 2015-06-20 NOTE — ED Notes (Signed)
Onset yesterday sinus swelling and she started wheezing last night.  She has used her nebulizer but she feels she is getting worse.  She has productive coughing--"foamy"

## 2015-06-20 NOTE — ED Provider Notes (Signed)
CSN: [id number]     Arrival date & time 06/20/15  1859 History   First MD Initiated Contact with Patient 06/20/15 1916     Chief Complaint  Patient presents with  . Shortness of Breath   (Consider location/radiation/quality/duration/timing/severity/associated sxs/prior Treatment) HPI Comments: Patient is a 64 yo black female with a past medical history of COPD who presents with dyspnea. She is noted to have frequent hospitalization for COPD exacerbation and was just discharged a week ago. She reports ntfeeling well again since Tuesday with dyspnea and "foamy" cough. Feels "weak". No fever or chills are noted.   Patient is a 64 y.o. female presenting with shortness of breath. The history is provided by the patient.  Shortness of Breath   Past Medical History  Diagnosis Date  . Hyperlipidemia   . Hypertension   . Tobacco abuse   . COPD (chronic obstructive pulmonary disease)     History of multiple hospital admissions for exercabation   . Asthma   . Breast cancer 1991    s/p lumpectomy, chemotherapy and radiation therapy in 1991. Mammogram in 2007 was normal.  . Sigmoid diverticulitis 80/2008  . Anxiety   . Depression   . Obesity   . GERD (gastroesophageal reflux disease)   . Heart murmur 10/05/11    "first time I ever heard I had one was today"  . Pneumonia   . Shortness of breath 10/05/11    "at rest; lying down; w/exertion"  . Diabetes mellitus   . Bronchitis     h/o  . Diarrhea     h/o  . Constipated     h/o  . COPD with exacerbation 04/06/2009    Qualifier: Diagnosis of  By: Eyvonne Mechanic MD, Doyne Keel     Past Surgical History  Procedure Laterality Date  . Dobutamine stress echo  08/2004    Inferior ischemia, normal LV systolic function, no significant CAD  . Abdominal hysterectomy    . Breast surgery  1991    lumphectomy right breast  . Neck surgery  2012    "Dr. Lynann Bologna  put plate in; did something to my vertebrae"   Family History  Problem Relation Age of Onset  .  Cancer Mother    Social History  Substance Use Topics  . Smoking status: Current Some Day Smoker -- 0.10 packs/day for 40 years    Types: Cigarettes    Last Attempt to Quit: 09/11/2014  . Smokeless tobacco: Never Used     Comment: slowly quitting.DOWN TO ABOUT 6 CIGARETTES PER DAY  . Alcohol Use: 2.4 oz/week    4 Cans of beer per week     Comment: "only on the weekends"   OB History    No data available     Review of Systems  Respiratory: Positive for shortness of breath.     Allergies  Ace inhibitors and Flonase  Home Medications   Prior to Admission medications   Medication Sig Start Date End Date Taking? Authorizing Provider  ACCU-CHEK FASTCLIX LANCETS MISC 1 each by Other route See admin instructions. Check blood sugar daily as needed for high blood sugar.   Yes Historical Provider, MD  acetaminophen (TYLENOL) 325 MG tablet Take 650 mg by mouth 2 (two) times daily as needed for headache.   Yes Historical Provider, MD  albuterol (PROVENTIL) (2.5 MG/3ML) 0.083% nebulizer solution Take 3 mLs (2.5 mg total) by nebulization every 6 (six) hours as needed for wheezing or shortness of breath. 03/06/15  Yes Rachel Moulds  Hoffman, DO  albuterol (VENTOLIN HFA) 108 (90 BASE) MCG/ACT inhaler Inhale 2 puffs into the lungs every 6 (six) hours as needed for wheezing or shortness of breath. 08/07/14  Yes Jones Bales, MD  Blood Glucose Monitoring Suppl (ACCU-CHEK NANO SMARTVIEW) W/DEVICE KIT 1 each by Other route See admin instructions. Check blood sugar daily as needed for high blood sugar.   Yes Historical Provider, MD  budesonide-formoterol (SYMBICORT) 160-4.5 MCG/ACT inhaler Inhale 2 puffs into the lungs 2 (two) times daily. 03/06/15  Yes Lucious Groves, DO  carvedilol (COREG) 6.25 MG tablet take 1 tablet by mouth twice a day with meals 03/13/15  Yes Jones Bales, MD  citalopram (CELEXA) 40 MG tablet Take 1 tablet (40 mg total) by mouth daily. 06/13/15  Yes Jones Bales, MD  glucose blood  (ACCU-CHEK SMARTVIEW) test strip Use to check blood sugar 1 to 2 times daily. diag code E 11.9. Non- insulin dependent 10/02/14  Yes Sid Falcon, MD  guaiFENesin-codeine 100-10 MG/5ML syrup Take 5 mLs by mouth every 4 (four) hours as needed for cough. 05/17/15  Yes Corky Sox, MD  losartan-hydrochlorothiazide (HYZAAR) 100-25 MG per tablet Take 1 tablet by mouth daily. 04/08/15  Yes Jessee Avers, MD  metFORMIN (GLUCOPHAGE) 1000 MG tablet Take 1 tablet (1,000 mg total) by mouth 2 (two) times daily with a meal. 01/03/15  Yes Bartholomew Crews, MD  pravastatin (PRAVACHOL) 40 MG tablet TAKE 1 TABLET BY MOUTH DAILY Patient taking differently: TAKE 1 TABLET BY MOUTH DAILY AT BEDTIME 04/08/15  Yes Jones Bales, MD  sodium chloride (OCEAN) 0.65 % SOLN nasal spray Place 2 sprays into both nostrils 3 (three) times daily. Patient taking differently: Place 1 spray into both nostrils 3 (three) times daily.  04/14/15  Yes Jones Bales, MD  tiotropium (SPIRIVA) 18 MCG inhalation capsule Inhale 2 puffs daily for COPD maintenance Patient taking differently: Inhale 2 puffs (1 capsule) daily for COPD maintenance 06/13/15  Yes Jones Bales, MD   BP 132/87 mmHg  Pulse 110  Temp(Src) 98.2 F (36.8 C) (Oral)  Resp 20  SpO2 99% Physical Exam  Constitutional: She is oriented to person, place, and time. She appears well-developed and well-nourished.  Sitting on table, coughing, unable to speak in full sentences to me-though O2 at 99%  Pulmonary/Chest: No respiratory distress. She has wheezes.  Neurological: She is alert and oriented to person, place, and time.  Skin: Skin is warm and dry.  Psychiatric: Her behavior is normal.  Nursing note and vitals reviewed.   ED Course  Procedures (including critical care time) Labs Review Labs Reviewed - No data to display  Imaging Review No results found.   MDM   1. COPD, frequent exacerbations   2. Dyspnea    Multiple admissions for COPD  exacerbations. Today with foamy cough, malaise, wheeze and worsening symptoms. Will send to ER for evaluation and monitoring.   Bjorn Pippin, PA-C 06/20/15 1932

## 2015-06-20 NOTE — Patient Outreach (Addendum)
Referral received from hospital liaison on 06/18/15 for community care management involvement with member.  Member recently discharged (06/10/15) from hospital with diagnosis of COPD exacerbation.  Member also has history of diabetes and hypertension according to chart.    Call placed to member to introduce [facility] community care management services.  No answer at number provided [phone]), HIPPA compliant voice message left.  Will make second attempt contact member next week.  [name], BSN, [place] Management  [facility] Care Manager [phone]

## 2015-06-20 NOTE — ED Provider Notes (Signed)
CSN: [id number]     Arrival date & time 06/20/15  1947 History   First MD Initiated Contact with Patient 06/20/15 2156     Chief Complaint  Patient presents with  . Shortness of Breath     (Consider location/radiation/quality/duration/timing/severity/associated sxs/prior Treatment) HPI Comments: Patient is a 64 year old female with a past medical history of hypertension, COPD, diabetes and tobacco abuse who presents with SOB that started yesterday. Symptoms started gradually and progressively worsened since the onset. She reports associated productive cough that is "foamy."  She denies pain. She tried 3 nebulizer treatments at home and OTC cough suppressant without relief. She denies fever or any other associated symptoms. Exertion makes the symptoms worse. No alleviating factors.    Past Medical History  Diagnosis Date  . Hyperlipidemia   . Hypertension   . Tobacco abuse   . COPD (chronic obstructive pulmonary disease)     History of multiple hospital admissions for exercabation   . Asthma   . Breast cancer 1991    s/p lumpectomy, chemotherapy and radiation therapy in 1991. Mammogram in 2007 was normal.  . Sigmoid diverticulitis 80/2008  . Anxiety   . Depression   . Obesity   . GERD (gastroesophageal reflux disease)   . Heart murmur 10/05/11    "first time I ever heard I had one was today"  . Pneumonia   . Shortness of breath 10/05/11    "at rest; lying down; w/exertion"  . Diabetes mellitus   . Bronchitis     h/o  . Diarrhea     h/o  . Constipated     h/o  . COPD with exacerbation 04/06/2009    Qualifier: Diagnosis of  By: Eyvonne Mechanic MD, Doyne Keel     Past Surgical History  Procedure Laterality Date  . Dobutamine stress echo  08/2004    Inferior ischemia, normal LV systolic function, no significant CAD  . Abdominal hysterectomy    . Breast surgery  1991    lumphectomy right breast  . Neck surgery  2012    "Dr. Lynann Bologna  put plate in; did something to my vertebrae"    Family History  Problem Relation Age of Onset  . Cancer Mother    Social History  Substance Use Topics  . Smoking status: Current Some Day Smoker -- 0.10 packs/day for 40 years    Types: Cigarettes    Last Attempt to Quit: 09/11/2014  . Smokeless tobacco: Never Used     Comment: slowly quitting.DOWN TO ABOUT 6 CIGARETTES PER DAY  . Alcohol Use: 2.4 oz/week    4 Cans of beer per week     Comment: "only on the weekends"   OB History    No data available     Review of Systems  Respiratory: Positive for shortness of breath and wheezing.   All other systems reviewed and are negative.     Allergies  Ace inhibitors and Flonase  Home Medications   Prior to Admission medications   Medication Sig Start Date End Date Taking? Authorizing Provider  ACCU-CHEK FASTCLIX LANCETS MISC 1 each by Other route See admin instructions. Check blood sugar daily as needed for high blood sugar.    Historical Provider, MD  acetaminophen (TYLENOL) 325 MG tablet Take 650 mg by mouth 2 (two) times daily as needed for headache.    Historical Provider, MD  albuterol (PROVENTIL) (2.5 MG/3ML) 0.083% nebulizer solution Take 3 mLs (2.5 mg total) by nebulization every 6 (six) hours as needed  for wheezing or shortness of breath. 03/06/15   Lucious Groves, DO  albuterol (VENTOLIN HFA) 108 (90 BASE) MCG/ACT inhaler Inhale 2 puffs into the lungs every 6 (six) hours as needed for wheezing or shortness of breath. 08/07/14   Jones Bales, MD  Blood Glucose Monitoring Suppl (ACCU-CHEK NANO SMARTVIEW) W/DEVICE KIT 1 each by Other route See admin instructions. Check blood sugar daily as needed for high blood sugar.    Historical Provider, MD  budesonide-formoterol (SYMBICORT) 160-4.5 MCG/ACT inhaler Inhale 2 puffs into the lungs 2 (two) times daily. 03/06/15   Lucious Groves, DO  carvedilol (COREG) 6.25 MG tablet take 1 tablet by mouth twice a day with meals 03/13/15   Jones Bales, MD  citalopram (CELEXA) 40 MG  tablet Take 1 tablet (40 mg total) by mouth daily. 06/13/15   Jones Bales, MD  glucose blood (ACCU-CHEK SMARTVIEW) test strip Use to check blood sugar 1 to 2 times daily. diag code E 11.9. Non- insulin dependent 10/02/14   Sid Falcon, MD  guaiFENesin-codeine 100-10 MG/5ML syrup Take 5 mLs by mouth every 4 (four) hours as needed for cough. 05/17/15   Corky Sox, MD  losartan-hydrochlorothiazide (HYZAAR) 100-25 MG per tablet Take 1 tablet by mouth daily. 04/08/15   Jessee Avers, MD  metFORMIN (GLUCOPHAGE) 1000 MG tablet Take 1 tablet (1,000 mg total) by mouth 2 (two) times daily with a meal. 01/03/15   Bartholomew Crews, MD  pravastatin (PRAVACHOL) 40 MG tablet TAKE 1 TABLET BY MOUTH DAILY Patient taking differently: TAKE 1 TABLET BY MOUTH DAILY AT BEDTIME 04/08/15   Jones Bales, MD  sodium chloride (OCEAN) 0.65 % SOLN nasal spray Place 2 sprays into both nostrils 3 (three) times daily. Patient taking differently: Place 1 spray into both nostrils 3 (three) times daily.  04/14/15   Jones Bales, MD  tiotropium (SPIRIVA) 18 MCG inhalation capsule Inhale 2 puffs daily for COPD maintenance Patient taking differently: Inhale 2 puffs (1 capsule) daily for COPD maintenance 06/13/15   Jones Bales, MD   BP 128/75 mmHg  Pulse 111  Temp(Src) 98.8 F (37.1 C) (Oral)  Resp 20  SpO2 98% Physical Exam  Constitutional: She is oriented to person, place, and time. She appears well-developed and well-nourished. No distress.  HENT:  Head: Normocephalic and atraumatic.  Eyes: Conjunctivae and EOM are normal.  Neck: Normal range of motion. Neck supple.  Cardiovascular: Normal rate and regular rhythm.  Exam reveals no gallop and no friction rub.   No murmur heard. No lower extremity edema or calf tenderness to palpation.   Pulmonary/Chest: She has wheezes. She has no rales. She exhibits no tenderness.  Increased breathing effort. Inspiratory wheezing noted in bilateral lung fields.    Abdominal: Soft. There is no tenderness.  Musculoskeletal: Normal range of motion.  Neurological: She is alert and oriented to person, place, and time. Coordination normal.  Speech is goal-oriented. Moves limbs without ataxia.   Skin: Skin is warm and dry.  Psychiatric: She has a normal mood and affect. Her behavior is normal.  Nursing note and vitals reviewed.   ED Course  Procedures (including critical care time) Labs Review Labs Reviewed  BASIC METABOLIC PANEL - Abnormal; Notable for the following:    Potassium 3.4 (*)    Chloride 99 (*)    Glucose, Bld 123 (*)    All other components within normal limits  CBC - Abnormal; Notable for the following:  WBC 10.6 (*)    All other components within normal limits  GLUCOSE, CAPILLARY - Abnormal; Notable for the following:    Glucose-Capillary 220 (*)    All other components within normal limits  I-STAT TROPOININ, ED    Imaging Review Dg Chest 2 View  06/20/2015   CLINICAL DATA:  Shortness of breath and cough.  EXAM: CHEST - 2 VIEW  COMPARISON:  Two-view chest x-ray 06/07/2015.  FINDINGS: Mild cardiac enlargement is present. Chronic interstitial coarsening is stable. There is no significant edema or effusion to suggest failure. Postsurgical changes are noted in the right axilla and lower cervical spine.  IMPRESSION: No acute cardiopulmonary disease or significant interval change.   Electronically Signed   By: San Morelle M.D.   On: 06/20/2015 22:49   I, Alvina Chou, personally reviewed and evaluated these images and lab results as part of my medical decision-making.   EKG Interpretation   Date/Time:  Friday June 20 2015 19:56:45 EDT Ventricular Rate:  109 PR Interval:  132 QRS Duration: 90 QT Interval:  348 QTC Calculation: 468 R Axis:   80 Text Interpretation:  Sinus tachycardia Right atrial enlargement  Borderline ECG No significant change since last tracing Confirmed by  Debby Freiberg [phone]) on  06/21/2015 12:13:12 AM      MDM   Final diagnoses:  COPD exacerbation   11:36 PM Patient receiving albuterol, atrovent and prednisone. Labs unremarkable for acute changes. Patient likely having COPD exacerbation.   Patient not showing much improvement after hour long nebulizer treatmean, prednisone, and levaquin. Patient was able to maintain 91% oxygen saturation while ambulating. Patient will be admitted for further treatment.   [address] [place], PA-C 06/21/15 2979  Debby Freiberg, MD 06/25/15 Dyann Kief

## 2015-06-21 DIAGNOSIS — J209 Acute bronchitis, unspecified: Secondary | ICD-10-CM | POA: Diagnosis not present

## 2015-06-21 DIAGNOSIS — Z853 Personal history of malignant neoplasm of breast: Secondary | ICD-10-CM

## 2015-06-21 DIAGNOSIS — E785 Hyperlipidemia, unspecified: Secondary | ICD-10-CM

## 2015-06-21 DIAGNOSIS — I10 Essential (primary) hypertension: Secondary | ICD-10-CM

## 2015-06-21 DIAGNOSIS — F411 Generalized anxiety disorder: Secondary | ICD-10-CM | POA: Diagnosis not present

## 2015-06-21 DIAGNOSIS — F17211 Nicotine dependence, cigarettes, in remission: Secondary | ICD-10-CM | POA: Diagnosis not present

## 2015-06-21 DIAGNOSIS — Z7951 Long term (current) use of inhaled steroids: Secondary | ICD-10-CM | POA: Diagnosis not present

## 2015-06-21 LAB — GLUCOSE, CAPILLARY
GLUCOSE-CAPILLARY: 223 mg/dL — AB (ref 65–99)
GLUCOSE-CAPILLARY: 198 mg/dL — AB (ref 65–99)
Glucose-Capillary: 220 mg/dL — ABNORMAL HIGH (ref 65–99)
Glucose-Capillary: 261 mg/dL — ABNORMAL HIGH (ref 65–99)

## 2015-06-21 MED ORDER — ENOXAPARIN SODIUM 60 MG/0.6ML ~~LOC~~ SOLN
50.0000 mg | SUBCUTANEOUS | Status: DC
  Administered 2015-06-21 – 2015-06-23 (×3): 50 mg via SUBCUTANEOUS
  Filled 2015-06-21 (×3): qty 0.6

## 2015-06-21 MED ORDER — POTASSIUM CHLORIDE CRYS ER 20 MEQ PO TBCR
40.0000 meq | EXTENDED_RELEASE_TABLET | Freq: Once | ORAL | Status: AC
  Administered 2015-06-21: 40 meq via ORAL
  Filled 2015-06-21: qty 2

## 2015-06-21 MED ORDER — IPRATROPIUM-ALBUTEROL 0.5-2.5 (3) MG/3ML IN SOLN: 3.0000 mL | RESPIRATORY_TRACT | Status: DC | PRN

## 2015-06-21 MED ORDER — LOSARTAN POTASSIUM-HCTZ 100-25 MG PO TABS: 1.0000 | ORAL_TABLET | Freq: Every day | ORAL | Status: DC

## 2015-06-21 MED ORDER — ACETAMINOPHEN 325 MG PO TABS
650.0000 mg | ORAL_TABLET | Freq: Once | ORAL | Status: AC
  Administered 2015-06-21: 650 mg via ORAL
  Filled 2015-06-21: qty 2

## 2015-06-21 MED ORDER — LOSARTAN POTASSIUM 50 MG PO TABS
100.0000 mg | ORAL_TABLET | Freq: Every day | ORAL | Status: DC
  Administered 2015-06-21 – 2015-06-23 (×3): 100 mg via ORAL
  Filled 2015-06-21 (×3): qty 2

## 2015-06-21 MED ORDER — BUDESONIDE-FORMOTEROL FUMARATE 160-4.5 MCG/ACT IN AERO
2.0000 | INHALATION_SPRAY | Freq: Two times a day (BID) | RESPIRATORY_TRACT | Status: DC
  Administered 2015-06-21 – 2015-06-22 (×4): 2 via RESPIRATORY_TRACT
  Filled 2015-06-21: qty 6

## 2015-06-21 MED ORDER — IPRATROPIUM-ALBUTEROL 0.5-2.5 (3) MG/3ML IN SOLN: 3.0000 mL | RESPIRATORY_TRACT | Status: DC

## 2015-06-21 MED ORDER — GUAIFENESIN-CODEINE 100-10 MG/5ML PO SOLN
5.0000 mL | ORAL | Status: DC | PRN
  Administered 2015-06-21: 5 mL via ORAL
  Filled 2015-06-21: qty 5

## 2015-06-21 MED ORDER — ONDANSETRON HCL 4 MG/2ML IJ SOLN: 4.0000 mg | Freq: Four times a day (QID) | INTRAMUSCULAR | Status: DC | PRN

## 2015-06-21 MED ORDER — INSULIN ASPART 100 UNIT/ML ~~LOC~~ SOLN
0.0000 [IU] | Freq: Three times a day (TID) | SUBCUTANEOUS | Status: DC
  Administered 2015-06-21: 3 [IU] via SUBCUTANEOUS
  Administered 2015-06-21: 5 [IU] via SUBCUTANEOUS
  Administered 2015-06-21: 2 [IU] via SUBCUTANEOUS
  Administered 2015-06-22: 5 [IU] via SUBCUTANEOUS
  Administered 2015-06-22: 1 [IU] via SUBCUTANEOUS
  Administered 2015-06-22: 9 [IU] via SUBCUTANEOUS
  Administered 2015-06-23: 1 [IU] via SUBCUTANEOUS
  Administered 2015-06-23: 5 [IU] via SUBCUTANEOUS
  Administered 2015-06-23: 7 [IU] via SUBCUTANEOUS

## 2015-06-21 MED ORDER — HYDROCHLOROTHIAZIDE 25 MG PO TABS
25.0000 mg | ORAL_TABLET | Freq: Every day | ORAL | Status: DC
  Administered 2015-06-21 – 2015-06-23 (×3): 25 mg via ORAL
  Filled 2015-06-21 (×3): qty 1

## 2015-06-21 MED ORDER — HYDROCODONE-ACETAMINOPHEN 5-325 MG PO TABS
2.0000 | ORAL_TABLET | Freq: Once | ORAL | Status: AC
  Administered 2015-06-21: 2 via ORAL
  Filled 2015-06-21: qty 2

## 2015-06-21 MED ORDER — PREDNISONE 20 MG PO TABS
40.0000 mg | ORAL_TABLET | Freq: Every day | ORAL | Status: DC
  Administered 2015-06-22 – 2015-06-23 (×2): 40 mg via ORAL
  Filled 2015-06-21 (×2): qty 2

## 2015-06-21 MED ORDER — ALBUTEROL (5 MG/ML) CONTINUOUS INHALATION SOLN
10.0000 mg/h | INHALATION_SOLUTION | RESPIRATORY_TRACT | Status: DC
  Administered 2015-06-21: 10 mg/h via RESPIRATORY_TRACT
  Filled 2015-06-21: qty 20

## 2015-06-21 MED ORDER — CARVEDILOL 6.25 MG PO TABS
6.2500 mg | ORAL_TABLET | Freq: Two times a day (BID) | ORAL | Status: DC
  Administered 2015-06-21 – 2015-06-23 (×6): 6.25 mg via ORAL
  Filled 2015-06-21 (×5): qty 1
  Filled 2015-06-21 (×2): qty 2

## 2015-06-21 MED ORDER — LEVOFLOXACIN 500 MG PO TABS
500.0000 mg | ORAL_TABLET | Freq: Once | ORAL | Status: AC
  Administered 2015-06-21: 500 mg via ORAL
  Filled 2015-06-21: qty 1

## 2015-06-21 MED ORDER — CODEINE SULFATE 15 MG PO TABS
30.0000 mg | ORAL_TABLET | Freq: Four times a day (QID) | ORAL | Status: DC | PRN
  Administered 2015-06-21 – 2015-06-23 (×7): 30 mg via ORAL
  Filled 2015-06-21 (×8): qty 2

## 2015-06-21 MED ORDER — PREDNISONE 20 MG PO TABS
40.0000 mg | ORAL_TABLET | Freq: Every day | ORAL | Status: DC
  Administered 2015-06-21: 40 mg via ORAL
  Filled 2015-06-21: qty 2

## 2015-06-21 MED ORDER — CITALOPRAM HYDROBROMIDE 40 MG PO TABS
40.0000 mg | ORAL_TABLET | Freq: Every day | ORAL | Status: DC
  Administered 2015-06-21 – 2015-06-23 (×3): 40 mg via ORAL
  Filled 2015-06-21 (×3): qty 1

## 2015-06-21 MED ORDER — IPRATROPIUM-ALBUTEROL 0.5-2.5 (3) MG/3ML IN SOLN
3.0000 mL | Freq: Four times a day (QID) | RESPIRATORY_TRACT | Status: DC
  Administered 2015-06-21 – 2015-06-22 (×5): 3 mL via RESPIRATORY_TRACT
  Filled 2015-06-21 (×6): qty 3

## 2015-06-21 MED ORDER — INSULIN ASPART 100 UNIT/ML ~~LOC~~ SOLN
0.0000 [IU] | Freq: Every day | SUBCUTANEOUS | Status: DC
  Administered 2015-06-21: 2 [IU] via SUBCUTANEOUS

## 2015-06-21 MED ORDER — DOXYCYCLINE HYCLATE 100 MG PO TABS
100.0000 mg | ORAL_TABLET | Freq: Two times a day (BID) | ORAL | Status: DC
  Administered 2015-06-21 – 2015-06-22 (×3): 100 mg via ORAL
  Filled 2015-06-21 (×4): qty 1

## 2015-06-21 MED ORDER — PANTOPRAZOLE SODIUM 40 MG PO TBEC
40.0000 mg | DELAYED_RELEASE_TABLET | Freq: Every day | ORAL | Status: DC
  Administered 2015-06-21 – 2015-06-23 (×3): 40 mg via ORAL
  Filled 2015-06-21 (×3): qty 1

## 2015-06-21 MED ORDER — ONDANSETRON HCL 4 MG PO TABS: 4.0000 mg | ORAL_TABLET | Freq: Four times a day (QID) | ORAL | Status: DC | PRN

## 2015-06-21 MED ORDER — PRAVASTATIN SODIUM 40 MG PO TABS
40.0000 mg | ORAL_TABLET | Freq: Every day | ORAL | Status: DC
  Administered 2015-06-21 – 2015-06-22 (×2): 40 mg via ORAL
  Filled 2015-06-21 (×2): qty 1

## 2015-06-21 MED ORDER — LORATADINE 10 MG PO TABS
10.0000 mg | ORAL_TABLET | Freq: Every day | ORAL | Status: DC
  Administered 2015-06-21 – 2015-06-23 (×3): 10 mg via ORAL
  Filled 2015-06-21 (×3): qty 1

## 2015-06-21 NOTE — Progress Notes (Signed)
NURSING PROGRESS NOTE  [name] [id number] Admission Data: 06/21/2015 4:02 AM Attending Provider: Oval Linsey, MD DDU:KGUR, Malachi Bonds, MD Code Status: Full   [name] is a 64 y.o. female patient admitted from ED:  -No acute distress noted.  -No complaints of shortness of breath.  -No complaints of chest pain.   Blood pressure 139/86, pulse 102, temperature 98.1 F (36.7 C), temperature source Oral, resp. rate 19, weight 95.5 kg (210 lb 8.6 oz), SpO2 95 %.    Allergies:  Ace inhibitors and Flonase  Past Medical History:   has a past medical history of Hyperlipidemia; Hypertension; Tobacco abuse; COPD (chronic obstructive pulmonary disease); Asthma; Breast cancer (1991); Sigmoid diverticulitis (80/2008); Anxiety; Depression; Obesity; GERD (gastroesophageal reflux disease); Heart murmur (10/05/11); Pneumonia; Shortness of breath (10/05/11); Diabetes mellitus; Bronchitis; Diarrhea; Constipated; and COPD with exacerbation (04/06/2009).  Past Surgical History:   has past surgical history that includes Dobutamine stress echo (08/2004); Abdominal hysterectomy; Breast surgery (1991); and Neck surgery (2012).  Social History:   reports that she has been smoking Cigarettes.  She has a 4 pack-year smoking history. She has never used smokeless tobacco. She reports that she drinks about 2.4 oz of alcohol per week. She reports that she does not use illicit drugs.  Skin: Intact Patient/Family oriented to room. Information packet given to patient/family. Admission inpatient armband information verified with patient/family to include name and date of birth and placed on patient arm. Side rails up x 2, fall assessment and education completed with patient/family. Patient/family able to verbalize understanding of risk associated with falls and verbalized understanding to call for assistance before getting out of bed. Call light within reach. Patient/family able to voice and demonstrate  understanding of unit orientation instructions.

## 2015-06-21 NOTE — Progress Notes (Signed)
Subjective: [name] was seen and examined this morning. She complains of a persistent productive cough that is accompanies by rib pain. She also provided some additional history, saying that before these episodes of dyspnea, she has sinus pressure and loses senses of taste and smell. She also reports some recent episodes of acid reflux.  Objective: Vital signs in last 24 hours: Filed Vitals:   06/21/15 0329 06/21/15 0507 06/21/15 0745 06/21/15 1128  BP: 139/86 143/60    Pulse: 102 94    Temp: 98.1 F (36.7 C) 98.2 F (36.8 C)    TempSrc: Oral Oral    Resp: 19 18    Weight: 210 lb 8.6 oz (95.5 kg)     SpO2: 95% 94% 96% 95%   Weight change:   Intake/Output Summary (Last 24 hours) at 06/21/15 1447 Last data filed at 06/21/15 0639  Gross per 24 hour  Intake      0 ml  Output    150 ml  Net   -150 ml   Physical Exam General:  Lying in bed, no acute distress, eyes closed. HEENT:  No cervical lymphadenopathy or tonsillar erythema, EOMI. Wheezing-like sound heard on anterior neck Cardiovascular/Chest: Tenderness to palpation of right chest wall. Mildly tachycardic. Regular rhythm with no murmurs heard. Normal S1, S2 Pulmonary:  Clear to ausculation without wheezing, crackles, or rhonchi Abdominal:  Soft, non-tender, non-distended. Normal bowel sounds Ext: No edema  Lab Results: Basic Metabolic Panel:  Recent Labs Lab 06/20/15 2017  NA 137  K 3.4*  CL 99*  CO2 25  GLUCOSE 123*  BUN 7  CREATININE 0.88  CALCIUM 9.6   CBC:  Recent Labs Lab 06/20/15 2017  WBC 10.6*  HGB 12.4  HCT 36.3  MCV 85.4  PLT 358   CBG:  Recent Labs Lab 06/21/15 0327 06/21/15 0804 06/21/15 1211  GLUCAP 220* 223* 198*   Studies/Results: Dg Chest 2 View  06/20/2015   CLINICAL DATA:  Shortness of breath and cough.  EXAM: CHEST - 2 VIEW  COMPARISON:  Two-view chest x-ray 06/07/2015.  FINDINGS: Mild cardiac enlargement is present. Chronic interstitial coarsening is stable. There is  no significant edema or effusion to suggest failure. Postsurgical changes are noted in the right axilla and lower cervical spine.  IMPRESSION: No acute cardiopulmonary disease or significant interval change.   Electronically Signed   By: San Morelle M.D.   On: 06/20/2015 22:49   Medications: Scheduled Meds: . budesonide-formoterol  2 puff Inhalation BID  . carvedilol  6.25 mg Oral BID WC  . citalopram  40 mg Oral Daily  . enoxaparin (LOVENOX) injection  50 mg Subcutaneous Q24H  . losartan  100 mg Oral Daily   And  . hydrochlorothiazide  25 mg Oral Daily  . insulin aspart  0-5 Units Subcutaneous QHS  . insulin aspart  0-9 Units Subcutaneous TID WC  . ipratropium-albuterol  3 mL Nebulization QID  . loratadine  10 mg Oral Daily  . pantoprazole  40 mg Oral Daily  . pravastatin  40 mg Oral QHS   Continuous Infusions:  PRN Meds:.codeine, ipratropium-albuterol, ondansetron **OR** ondansetron (ZOFRAN) IV Assessment/Plan: Active Problems:   COPD exacerbation   Shortness of breath   Bronchitis, chronic obstructive w acute bronchitis  Presumed COPD exacerbation 2/2 acute bronchitis: Unable to assess the nature of her obstructive lung disease since PFTs were not obtained as an outpatient. She is scheduled for PFTs on August 16 as an outpatient, which we will reiterate to her  the importance of attending. There could also be a component of anxiety that is worsening her dyspnea symptoms. Given description of sinusitis, year-round allergies may also play a role. - Continue Duonebs q12h - Continue Symbicort - Codeine 30 mg q6h for cough - Prednisone 40 mg for 5 days - Loratadine 5 mg - Doxycycline 100 mg BID for bronchitis for 5 days - Obtain PFTs as an inpatient if still here on 8/15 - If d/c'ed on Monday, make sure she goes home on spiriva, rescue inhaler, and symbicort  Anxiety- An likely exacerbater of her dyspnea -continue home meds Celexa 40 mg daily  -If d/c'ed on Monday, make  sure she goes home on these meds  Diabetes mellitus type II- Well controlled. Last A1c was 6.1 in June 2016 -home meds are metformin 1000mg  bid -SSI-sensitive with HS coverage  Hypertension -stable on Coreg 6.25mg  bid, Hyzaar 100-25mg  daily. -continue home meds  Hyperlipidemia -on pravastatin 40mg  daily at home.  -continue home meds -last lipid panel August 2015 with TC 170, TG 166, HDL 46, LDL 91. Will need lipid panel as outpatient  Dispo: Disposition is deferred at this time, awaiting improvement of current medical problems.  Anticipated discharge in 1 day.  The patient does have a current PCP (Jones Bales, MD) and does need an [facility] hospital follow-up appointment after discharge.  The patient does not have transportation limitations that hinder transportation to clinic appointments.  .Services Needed at time of discharge: Y = Yes, Blank = No PT:   OT:   RN:   Equipment:   Other:       Liberty Handy, MD 06/21/2015, 2:47 PM

## 2015-06-21 NOTE — H&P (Signed)
Date: 06/21/2015               Patient Name:  [name] MRN: [id number]  DOB: 1951/05/29 Age / Sex: 64 y.o., female   PCP: Marrian Salvage, MD         Medical Service: Internal Medicine Teaching Service         Attending Physician: Dr. Doneen Poisson, MD    First Contact: Dr. Selina Cooley Pager: [phone]  Second Contact: Dr. Evelena Peat Pager: [phone]       After Hours (After 5p/  First Contact Pager: [phone]  weekends / holidays): Second Contact Pager: [phone]   Chief Complaint: shortness of breath  History of Present Illness: [name] is a 64 y.o. AA. female with past medical history of presumed COPD (no PFTs) with multiple exacerbations, diabetes mellitus type II, hypertension, hyperlipidemia, history of breast cancer who presented to the emergency department with worsening shortness of breath and productive cough of a clear-yellowish sputum.  Patient also reports being very tired and fatigued.  Patient states that her symptoms began Thursday with cough and sputum production developed yesterday.  States she has used her albuterol inhalers and guaifenesin-codeine cough syrup without relief.  Patient states that the syrup actually made her symptoms worse.  She denies any fever, chills, nausea, vomiting.  Does endorse pain underneath her ribs that she attributes to her cough.  States that the pain is constant now because of how much she has been coughing but that it is also aggravated by cough.   At home, [name] is not on any home oxygen.  She uses albuterol and Symbicort inhalers at home.  She continues to smoke, although, states that her last cigarette was 4-5 days ago and she has been cutting back lately.  In the emergency department, patient received albuterol, Atrovent, prednisone, and a dose of Levaquin.  She was afebrile, tachycardic at 111 with BP of 128/75.  She maintained O2 sats in the upper 90s and when ambulated in the hallway, her lowest  saturation level was 91%.  She had a negative 2-view chest xray and EKG showed sinus tachycardia with no significant change since her last tracing.  Meds: No current facility-administered medications for this encounter.   Current Outpatient Prescriptions  Medication Sig Dispense Refill  . acetaminophen (TYLENOL) 325 MG tablet Take 650 mg by mouth 2 (two) times daily as needed for headache.    . albuterol (PROVENTIL) (2.5 MG/3ML) 0.083% nebulizer solution Take 3 mLs (2.5 mg total) by nebulization every 6 (six) hours as needed for wheezing or shortness of breath. 360 mL 3  . albuterol (VENTOLIN HFA) 108 (90 BASE) MCG/ACT inhaler Inhale 2 puffs into the lungs every 6 (six) hours as needed for wheezing or shortness of breath. 1 Inhaler 3  . budesonide-formoterol (SYMBICORT) 160-4.5 MCG/ACT inhaler Inhale 2 puffs into the lungs 2 (two) times daily. 1 Inhaler 12  . carvedilol (COREG) 6.25 MG tablet take 1 tablet by mouth twice a day with meals 60 tablet 3  . citalopram (CELEXA) 40 MG tablet Take 1 tablet (40 mg total) by mouth daily. 30 tablet 3  . guaiFENesin-codeine 100-10 MG/5ML syrup Take 5 mLs by mouth every 4 (four) hours as needed for cough. 120 mL 0  . losartan-hydrochlorothiazide (HYZAAR) 100-25 MG per tablet Take 1 tablet by mouth daily. 30 tablet 2  . metFORMIN (GLUCOPHAGE) 1000 MG tablet Take 1 tablet (1,000 mg total) by mouth 2 (two) times  daily with a meal. 60 tablet 11  . pravastatin (PRAVACHOL) 40 MG tablet TAKE 1 TABLET BY MOUTH DAILY (Patient taking differently: TAKE 1 TABLET BY MOUTH DAILY AT BEDTIME) 90 tablet 0  . sodium chloride (OCEAN) 0.65 % SOLN nasal spray Place 2 sprays into both nostrils 3 (three) times daily. (Patient taking differently: Place 1 spray into both nostrils 3 (three) times daily. ) 15 mL 3  . tiotropium (SPIRIVA) 18 MCG inhalation capsule Inhale 2 puffs daily for COPD maintenance (Patient taking differently: Place 18 mcg into inhaler and inhale daily. ) 30  capsule 12  . ACCU-CHEK FASTCLIX LANCETS MISC 1 each by Other route See admin instructions. Check blood sugar daily as needed for high blood sugar.    . Blood Glucose Monitoring Suppl (ACCU-CHEK NANO SMARTVIEW) W/DEVICE KIT 1 each by Other route See admin instructions. Check blood sugar daily as needed for high blood sugar.    Marland Kitchen glucose blood (ACCU-CHEK SMARTVIEW) test strip Use to check blood sugar 1 to 2 times daily. diag code E 11.9. Non- insulin dependent 100 each 6  . [DISCONTINUED] albuterol (PROVENTIL,VENTOLIN) 90 MCG/ACT inhaler Inhale 2 puffs into the lungs every 6 (six) hours as needed for wheezing. 17 g 12    Allergies: Allergies as of 06/20/2015 - Review Complete 06/20/2015  Allergen Reaction Noted  . Ace inhibitors Swelling 12/26/2014  . Flonase [fluticasone] Other (See Comments) 12/25/2014   Past Medical History  Diagnosis Date  . Hyperlipidemia   . Hypertension   . Tobacco abuse   . COPD (chronic obstructive pulmonary disease)     History of multiple hospital admissions for exercabation   . Asthma   . Breast cancer 1991    s/p lumpectomy, chemotherapy and radiation therapy in 1991. Mammogram in 2007 was normal.  . Sigmoid diverticulitis 80/2008  . Anxiety   . Depression   . Obesity   . GERD (gastroesophageal reflux disease)   . Heart murmur 10/05/11    "first time I ever heard I had one was today"  . Pneumonia   . Shortness of breath 10/05/11    "at rest; lying down; w/exertion"  . Diabetes mellitus   . Bronchitis     h/o  . Diarrhea     h/o  . Constipated     h/o  . COPD with exacerbation 04/06/2009    Qualifier: Diagnosis of  By: Comer Locket MD, Raelene Bott     Past Surgical History  Procedure Laterality Date  . Dobutamine stress echo  08/2004    Inferior ischemia, normal LV systolic function, no significant CAD  . Abdominal hysterectomy    . Breast surgery  1991    lumphectomy right breast  . Neck surgery  2012    "Dr. [name]  put plate in; did something to  my vertebrae"   Family History  Problem Relation Age of Onset  . Cancer Mother    Social History   Social History  . Marital Status: Married    Spouse Name: N/A  . Number of Children: N/A  . Years of Education: 12   Occupational History  .  Unemployed   Social History Main Topics  . Smoking status: Current Some Day Smoker -- 0.10 packs/day for 40 years    Types: Cigarettes    Last Attempt to Quit: 09/11/2014  . Smokeless tobacco: Never Used     Comment: slowly quitting.DOWN TO ABOUT 6 CIGARETTES PER DAY  . Alcohol Use: 2.4 oz/week    4 Cans of  beer per week     Comment: "only on the weekends"  . Drug Use: No  . Sexual Activity: Not on file   Other Topics Concern  . Not on file   Social History Narrative   Lives in [place] with her husband.   Takes care of 3 grand children.   Trying to find a job, has financial difficulties.          Review of Systems: Review of Systems  Constitutional: Positive for malaise/fatigue. Negative for fever and chills.  HENT: Positive for congestion.   Respiratory: Positive for cough, sputum production, shortness of breath and wheezing. Negative for hemoptysis.   Cardiovascular: Negative for chest pain, palpitations and orthopnea.  Gastrointestinal: Negative for nausea, vomiting, abdominal pain, diarrhea and constipation.  Genitourinary: Negative for dysuria.  Musculoskeletal: Negative for myalgias.       Right sided rib pain  Neurological: Negative for dizziness and headaches.  Psychiatric/Behavioral: The patient is nervous/anxious.      Physical Exam: Blood pressure 144/60, pulse 99, temperature 98.8 F (37.1 C), temperature source Oral, resp. rate 17, SpO2 97 %.  Physical Exam  Constitutional: She is oriented to person, place, and time. She appears well-developed and well-nourished.  HENT:  Head: Normocephalic and atraumatic.  Eyes: EOM are normal.  Neck: Normal range of motion.  Wheezes heard loudest over anterior neck     Cardiovascular: Regular rhythm, normal heart sounds and intact distal pulses.   Rate is tachycardic during my exam  Pulmonary/Chest: Effort normal. She has wheezes (heard best anteriorly).  Abdominal: Soft. Bowel sounds are normal. She exhibits no distension. There is no tenderness.  Musculoskeletal: She exhibits no edema.  Neurological: She is alert and oriented to person, place, and time.  Skin: Skin is warm and dry.    Lab results: Basic Metabolic Panel:  Recent Labs  29/56/21 2017  NA 137  K 3.4*  CL 99*  CO2 25  GLUCOSE 123*  BUN 7  CREATININE 0.88  CALCIUM 9.6   CBC:  Recent Labs  06/20/15 2017  WBC 10.6*  HGB 12.4  HCT 36.3  MCV 85.4  PLT 358    Imaging results:  Dg Chest 2 View  06/20/2015   CLINICAL DATA:  Shortness of breath and cough.  EXAM: CHEST - 2 VIEW  COMPARISON:  Two-view chest x-ray 06/07/2015.  FINDINGS: Mild cardiac enlargement is present. Chronic interstitial coarsening is stable. There is no significant edema or effusion to suggest failure. Postsurgical changes are noted in the right axilla and lower cervical spine.  IMPRESSION: No acute cardiopulmonary disease or significant interval change.   Electronically Signed   By: Marin Roberts M.D.   On: 06/20/2015 22:49    Other results: EKG: unchanged from previous tracings, sinus tachycardia, right atrial enlargement.  Assessment & Plan by Problem: Active Problems:   COPD exacerbation  64 y.o. AA. female with past medical history of presumed COPD (no PFTs) with multiple exacerbations, diabetes mellitus type II, hypertension, hyperlipidemia, history of breast cancer who presented to the emergency department with worsening shortness of breath and productive cough of a clear-yellowish sputum.  ? COPD Exacerbation -Patient with multiple admissions for similar complaints.  ED provider did not see improvement in the ED with continuous nebulizer.  Likely has some type of obstructive airway  possibly 2/2 to COPD.  However, given she has expiratory wheezes heard without stethoscope, can consider an extrathoracic cause of her shortness of breath as well.  Wheeze heard best over her  anterior neck on auscultation as well as anteriorly.  Posterior lungs sounded much better on auscultation.  Patient does complain of congestion so possibly post-nasal drip contributing to her symptomology or a vocal cord dysfunction or her generalized anxiety.  Pneumonia unlikely given normal chest xray, afebrile, lack of elevated WBC -hold any further antibiotics for now -Duoneb now and then q2h -Symbicort 160-4.5 mcg/act 2 puffs bid -guaifenesin-codeine 100-10mg /3mL q4h prn -prednisone 40mg  daily x 4 doses -continue anxiety medication as below -needs to stop smoking  -needs to have her PFT test done  Anxiety -likely a contributory factor to patients recurrent episodes of shortness of breath -continue home meds Celexa 40mg  daily  -encourage her to follow up with Dr Delane Ginger recommendation for outpatient counseling  Diabetes mellitus type II -well controlled. Last A1c was 6.1 in June 2016 -home meds are metformin 1000mg  bid -SSI-sensitive with HS coverage  Hypertension -stable on Coreg 6.25mg  bid, Hyzaar 100-25mg  daily. -continue home meds  Hyperlipidemia -on pravastatin 40mg  daily at home.   -continue home meds -last lipid panel August 2015 with TC 170, TG 166, HDL 46, LDL 91.  Will need lipid panel as outpatient  Diet: NPO  DVT PPX: Lovenox  Code: Full  Dispo: Disposition is deferred at this time, awaiting improvement of current medical problems. Anticipated discharge in approximately 1-2 day(s).   The patient does have a current PCP (Marrian Salvage, MD) and does need an [facility] hospital follow-up appointment after discharge.  The patient does not have transportation limitations that hinder transportation to clinic appointments.  Signed: Gwynn Burly, DO 06/21/2015, 3:04 AM

## 2015-06-21 NOTE — ED Notes (Signed)
This RN ambulated pt in hallway. Lowest oxygen saturation level was 91%, [place] aware

## 2015-06-22 DIAGNOSIS — J209 Acute bronchitis, unspecified: Secondary | ICD-10-CM | POA: Diagnosis not present

## 2015-06-22 DIAGNOSIS — K5900 Constipation, unspecified: Secondary | ICD-10-CM | POA: Diagnosis not present

## 2015-06-22 DIAGNOSIS — F411 Generalized anxiety disorder: Secondary | ICD-10-CM | POA: Diagnosis not present

## 2015-06-22 DIAGNOSIS — E119 Type 2 diabetes mellitus without complications: Secondary | ICD-10-CM

## 2015-06-22 DIAGNOSIS — F17211 Nicotine dependence, cigarettes, in remission: Secondary | ICD-10-CM | POA: Diagnosis not present

## 2015-06-22 LAB — GLUCOSE, CAPILLARY
GLUCOSE-CAPILLARY: 145 mg/dL — AB (ref 65–99)
GLUCOSE-CAPILLARY: 264 mg/dL — AB (ref 65–99)
GLUCOSE-CAPILLARY: 363 mg/dL — AB (ref 65–99)
GLUCOSE-CAPILLARY: 198 mg/dL — AB (ref 65–99)
Glucose-Capillary: 250 mg/dL — ABNORMAL HIGH (ref 65–99)

## 2015-06-22 MED ORDER — POTASSIUM IODIDE 1 GM/ML PO SOLN
300.0000 mg | Freq: Four times a day (QID) | ORAL | Status: DC
  Filled 2015-06-22: qty 30

## 2015-06-22 MED ORDER — GUAIFENESIN 100 MG/5ML PO SOLN
10.0000 mL | ORAL | Status: DC | PRN
  Administered 2015-06-22 – 2015-06-23 (×2): 200 mg via ORAL
  Filled 2015-06-22: qty 5
  Filled 2015-06-22: qty 10
  Filled 2015-06-22: qty 5

## 2015-06-22 MED ORDER — DOCUSATE SODIUM 100 MG PO CAPS
100.0000 mg | ORAL_CAPSULE | Freq: Two times a day (BID) | ORAL | Status: DC
  Administered 2015-06-22 – 2015-06-23 (×3): 100 mg via ORAL
  Filled 2015-06-22 (×3): qty 1

## 2015-06-22 MED ORDER — IPRATROPIUM-ALBUTEROL 0.5-2.5 (3) MG/3ML IN SOLN
3.0000 mL | Freq: Four times a day (QID) | RESPIRATORY_TRACT | Status: DC
  Administered 2015-06-22 – 2015-06-23 (×2): 3 mL via RESPIRATORY_TRACT
  Filled 2015-06-22 (×3): qty 3

## 2015-06-22 MED ORDER — IPRATROPIUM-ALBUTEROL 0.5-2.5 (3) MG/3ML IN SOLN
3.0000 mL | Freq: Three times a day (TID) | RESPIRATORY_TRACT | Status: DC
  Administered 2015-06-22: 3 mL via RESPIRATORY_TRACT

## 2015-06-22 NOTE — Progress Notes (Signed)
Subjective:  [name] reports that she has had difficulty coughing up her mucus, reporting it gets "stuck." She is also very agreeable to obtaining PFTs, requesting if it would be possible to do it as patient in the hospital. She also complains of constipation since her admission began.  Objective: Vital signs in last 24 hours: Filed Vitals:   06/22/15 0551 06/22/15 0805 06/22/15 1315 06/22/15 1530  BP: 141/88   139/63  Pulse: 77   86  Temp: 98 F (36.7 C)   98.7 F (37.1 C)  TempSrc: Oral   Oral  Resp: 16   18  Weight:      SpO2: 100% 95% 97% 98%   Weight change:   Intake/Output Summary (Last 24 hours) at 06/22/15 1609 Last data filed at 06/22/15 0920  Gross per 24 hour  Intake      0 ml  Output   1700 ml  Net  -1700 ml   Physical Exam General:  Lying in bed, no acute distress Cardiovascular/Chest: Tenderness to palpation of right chest wall. Regular rate and rhythm. No murmurs. Normal S1, S2 Pulmonary:  Clear to ausculation without wheezing, crackles, or rhonchi Abdominal:  Soft, non-tender, non-distended. Normal bowel sounds Ext: No clubbing or edema  Lab Results: Basic Metabolic Panel:  Recent Labs Lab 06/20/15 2017  NA 137  K 3.4*  CL 99*  CO2 25  GLUCOSE 123*  BUN 7  CREATININE 0.88  CALCIUM 9.6   CBC:  Recent Labs Lab 06/20/15 2017  WBC 10.6*  HGB 12.4  HCT 36.3  MCV 85.4  PLT 358   CBG:  Recent Labs Lab 06/21/15 0804 06/21/15 1211 06/21/15 1719 06/21/15 2058 06/22/15 0807 06/22/15 1217  GLUCAP 223* 198* 261* 250* 145* 264*   Studies/Results: Dg Chest 2 View  06/20/2015   CLINICAL DATA:  Shortness of breath and cough.  EXAM: CHEST - 2 VIEW  COMPARISON:  Two-view chest x-ray 06/07/2015.  FINDINGS: Mild cardiac enlargement is present. Chronic interstitial coarsening is stable. There is no significant edema or effusion to suggest failure. Postsurgical changes are noted in the right axilla and lower cervical spine.  IMPRESSION: No  acute cardiopulmonary disease or significant interval change.   Electronically Signed   By: San Morelle M.D.   On: 06/20/2015 22:49   Medications: Scheduled Meds: . budesonide-formoterol  2 puff Inhalation BID  . carvedilol  6.25 mg Oral BID WC  . citalopram  40 mg Oral Daily  . docusate sodium  100 mg Oral BID  . doxycycline  100 mg Oral Q12H  . enoxaparin (LOVENOX) injection  50 mg Subcutaneous Q24H  . losartan  100 mg Oral Daily   And  . hydrochlorothiazide  25 mg Oral Daily  . insulin aspart  0-5 Units Subcutaneous QHS  . insulin aspart  0-9 Units Subcutaneous TID WC  . ipratropium-albuterol  3 mL Nebulization Q6H  . loratadine  10 mg Oral Daily  . pantoprazole  40 mg Oral Daily  . pravastatin  40 mg Oral QHS  . predniSONE  40 mg Oral Q breakfast   Continuous Infusions:  PRN Meds:.codeine, guaiFENesin, ipratropium-albuterol, ondansetron **OR** ondansetron (ZOFRAN) IV Assessment/Plan:  Presumed COPD exacerbation 2/2 acute bronchitis: The patient is agreeable to having PFTs as an inpatient, which we will arrange for tomorrow. There could also be a component of anxiety that is worsening her dyspnea symptoms. Given description of sinusitis, year-round allergies may also play a role. The "stuck" feeling suggests she could benefit  from an expectorant, specifically, potassium iodide which has been shown to be most effective. Unfortunately the inpatient pharmacy does not have that item in stock. - Obtain PFTs tomorrow - Continue Duonebs q12h - Continue Symbicort - Codeine 30 mg q6h for cough - Prednisone 40 mg for 5 days - Loratadine 5 mg - Doxycycline 100 mg BID for bronchitis for 5 days - Guaifenesin 200 mg q4h in lieu of potassium iodide, which is not in stock  Constipation: Start Colace  Anxiety- An likely exacerbater of her dyspnea -continue home meds Celexa 40 mg daily   Diabetes mellitus type II- Well controlled. Last A1c was 6.1 in June 2016. home meds are  metformin 1000mg  bid -SSI-sensitive with HS coverage  Hypertension -stable on Coreg 6.25mg  bid, Hyzaar 100-25mg  daily. -continue home meds  Hyperlipidemia -on pravastatin 40mg  daily at home.  -continue home meds -last lipid panel August 2015 with TC 170, TG 166, HDL 46, LDL 91. Will need lipid panel as outpatient  Dispo: Disposition is deferred at this time, awaiting improvement of current medical problems.  Anticipated discharge in 1 day.  The patient does have a current PCP (Christie Bales, MD) and does need an [facility] hospital follow-up appointment after discharge.  The patient does not have transportation limitations that hinder transportation to clinic appointments.  .Services Needed at time of discharge: Y = Yes, Blank = No PT:   OT:   RN:   Equipment:   Other:       Liberty Handy, MD 06/22/2015, 4:09 PM

## 2015-06-22 NOTE — Progress Notes (Signed)
Informed pt that she will be discharged tomorrow.  Pt stated "I don't think I feel like I can go.  The doctors said my numbers were fine, but I don't feel like myself.  When I was being helped to the bathroom, I could barely catch my breath on the way back to bed before respiratory was there to give me my breathing treatment."  Will pass on to night shift nurse.

## 2015-06-22 NOTE — Progress Notes (Signed)
Internal Medicine Attending  Date: 06/22/2015  Patient name: [name] Medical record number: [id number] Date of birth: 1951-05-08 Age: 64 y.o. Gender: female  I saw and evaluated the patient. I reviewed the resident's note by Dr. Marijean Bravo and I agree with the resident's findings and plans as documented in his progress note.  [name] notes difficulty expectorating some of the thick mucus. We had hoped to start potassium iodine but this product apparently is nonformulary. We will therefore use guafenisin in hopes of improving her ability to clear the mucus from her lungs. She now understands the importance of pulmonary function testing in better defining her pulmonary disease. We will obtain inpatient PFTs tomorrow in hopes of getting this important information which may better inform us as to what her underlying pathophysiology is.

## 2015-06-23 ENCOUNTER — Observation Stay (HOSPITAL_COMMUNITY): Payer: Medicare Other

## 2015-06-23 DIAGNOSIS — F411 Generalized anxiety disorder: Secondary | ICD-10-CM | POA: Diagnosis not present

## 2015-06-23 DIAGNOSIS — E119 Type 2 diabetes mellitus without complications: Secondary | ICD-10-CM | POA: Diagnosis not present

## 2015-06-23 DIAGNOSIS — J441 Chronic obstructive pulmonary disease with (acute) exacerbation: Secondary | ICD-10-CM | POA: Diagnosis not present

## 2015-06-23 DIAGNOSIS — F17211 Nicotine dependence, cigarettes, in remission: Secondary | ICD-10-CM | POA: Diagnosis not present

## 2015-06-23 DIAGNOSIS — I10 Essential (primary) hypertension: Secondary | ICD-10-CM | POA: Diagnosis not present

## 2015-06-23 LAB — PULMONARY FUNCTION TEST
DL/VA % pred: 86 %
DL/VA: 4.35 ml/min/mmHg/L
DLCO UNC: 18.67 ml/min/mmHg
DLCO unc % pred: 69 %
FEF 25-75 PRE: 0.54 L/s
FEF 25-75 Post: 0.53 L/sec
FEF2575-%CHANGE-POST: -1 %
FEF2575-%PRED-PRE: 26 %
FEF2575-%Pred-Post: 25 %
FEV1-%Change-Post: 0 %
FEV1-%Pred-Post: 68 %
FEV1-%Pred-Pre: 68 %
FEV1-POST: 1.48 L
FEV1-Pre: 1.48 L
FEV1FVC-%CHANGE-POST: 5 %
FEV1FVC-%Pred-Pre: 71 %
FEV6-%CHANGE-POST: -1 %
FEV6-%PRED-POST: 88 %
FEV6-%PRED-PRE: 89 %
FEV6-Post: 2.36 L
FEV6-Pre: 2.39 L
FEV6FVC-%CHANGE-POST: 3 %
FEV6FVC-%PRED-POST: 98 %
FEV6FVC-%PRED-PRE: 94 %
FVC-%Change-Post: -5 %
FVC-%Pred-Post: 89 %
FVC-%Pred-Pre: 94 %
FVC-Post: 2.47 L
FVC-Pre: 2.6 L
POST FEV6/FVC RATIO: 96 %
PRE FEV1/FVC RATIO: 57 %
Post FEV1/FVC ratio: 60 %
Pre FEV6/FVC Ratio: 92 %
RV % PRED: 106 %
RV: 2.33 L
TLC % PRED: 89 %
TLC: 4.81 L

## 2015-06-23 LAB — GLUCOSE, CAPILLARY
GLUCOSE-CAPILLARY: 121 mg/dL — AB (ref 65–99)
GLUCOSE-CAPILLARY: 260 mg/dL — AB (ref 65–99)
GLUCOSE-CAPILLARY: 326 mg/dL — AB (ref 65–99)

## 2015-06-23 MED ORDER — PREDNISONE 20 MG PO TABS: 40.0000 mg | ORAL_TABLET | Freq: Every day | ORAL | Status: DC

## 2015-06-23 MED ORDER — ALBUTEROL SULFATE (2.5 MG/3ML) 0.083% IN NEBU
2.5000 mg | INHALATION_SOLUTION | Freq: Once | RESPIRATORY_TRACT | Status: AC
  Administered 2015-06-23: 2.5 mg via RESPIRATORY_TRACT

## 2015-06-23 MED ORDER — NICOTINE 14 MG/24HR TD PT24: 14.0000 mg | MEDICATED_PATCH | Freq: Every day | TRANSDERMAL | Status: DC

## 2015-06-23 MED ORDER — CODEINE SULFATE 30 MG PO TABS: 30.0000 mg | ORAL_TABLET | Freq: Four times a day (QID) | ORAL | Status: DC | PRN

## 2015-06-23 MED ORDER — IPRATROPIUM-ALBUTEROL 0.5-2.5 (3) MG/3ML IN SOLN
3.0000 mL | Freq: Four times a day (QID) | RESPIRATORY_TRACT | Status: DC
  Administered 2015-06-23 (×2): 3 mL via RESPIRATORY_TRACT
  Filled 2015-06-23 (×2): qty 3

## 2015-06-23 MED ORDER — PREDNISONE 20 MG PO TABS: 40.0000 mg | ORAL_TABLET | Freq: Every day | ORAL | Status: AC

## 2015-06-23 MED ORDER — AMOXICILLIN-POT CLAVULANATE 875-125 MG PO TABS
1.0000 | ORAL_TABLET | Freq: Two times a day (BID) | ORAL | Status: DC
  Administered 2015-06-23: 1 via ORAL
  Filled 2015-06-23: qty 1

## 2015-06-23 MED ORDER — GUAIFENESIN 100 MG/5ML PO SOLN: 10.0000 mL | ORAL | Status: DC | PRN

## 2015-06-23 MED ORDER — IPRATROPIUM-ALBUTEROL 0.5-2.5 (3) MG/3ML IN SOLN: 3.0000 mL | Freq: Four times a day (QID) | RESPIRATORY_TRACT | Status: DC

## 2015-06-23 MED ORDER — LORATADINE 10 MG PO TABS: 10.0000 mg | ORAL_TABLET | Freq: Every day | ORAL | Status: DC

## 2015-06-23 MED ORDER — BUDESONIDE-FORMOTEROL FUMARATE 160-4.5 MCG/ACT IN AERO
2.0000 | INHALATION_SPRAY | Freq: Two times a day (BID) | RESPIRATORY_TRACT | Status: DC
  Administered 2015-06-23: 2 via RESPIRATORY_TRACT
  Filled 2015-06-23: qty 6

## 2015-06-23 MED ORDER — POTASSIUM IODIDE 1 GM/ML PO SOLN: 100.0000 mg | Freq: Three times a day (TID) | ORAL | Status: DC

## 2015-06-23 MED ORDER — AMOXICILLIN-POT CLAVULANATE 875-125 MG PO TABS: 1.0000 | ORAL_TABLET | Freq: Two times a day (BID) | ORAL | Status: DC

## 2015-06-23 NOTE — Discharge Instructions (Signed)

## 2015-06-23 NOTE — Consult Note (Signed)
   [facility] CM Inpatient Consult   06/23/2015  [name] 21-Aug-1951 [id number] Patient was recently referred to [facility] for services before this hospital stay.  [place] coordinator had made a recent attempt to contact the patient.  This writer, [facility], came by to speak with the patient about services but, is receiving personal care from unit staff.  This Probation officer will follow up for consent and [facility] needs at a ;ater time. Natividad Brood, RN BSN [facility] Liaison  [phone] business mobile phone

## 2015-06-23 NOTE — Discharge Summary (Signed)
 Name: [name] MRN: [id number] DOB: 09/16/1951 64 y.o. PCP: Jacquelyn S Gill, MD  Date of Admission: 06/20/2015  9:53 PM Date of Discharge: 06/23/2015 Attending Physician: Lawrence Klima, MD  Discharge Diagnoses: 1. COPD exacerbation 2. Generalized anxiety disorder 3. Tobacco use 4. Type II diabetes mellitus 5. Essential hypertension   Discharge Medications:   Medication List    STOP taking these medications        guaiFENesin-codeine 100-10 MG/5ML syrup      TAKE these medications        ACCU-CHEK FASTCLIX LANCETS Misc  1 each by Other route See admin instructions. Check blood sugar daily as needed for high blood sugar.     ACCU-CHEK NANO SMARTVIEW W/DEVICE Kit  1 each by Other route See admin instructions. Check blood sugar daily as needed for high blood sugar.     acetaminophen 325 MG tablet  Commonly known as:  TYLENOL  Take 650 mg by mouth 2 (two) times daily as needed for headache.     albuterol 108 (90 BASE) MCG/ACT inhaler  Commonly known as:  VENTOLIN HFA  Inhale 2 puffs into the lungs every 6 (six) hours as needed for wheezing or shortness of breath.     albuterol (2.5 MG/3ML) 0.083% nebulizer solution  Commonly known as:  PROVENTIL  Take 3 mLs (2.5 mg total) by nebulization every 6 (six) hours as needed for wheezing or shortness of breath.     amoxicillin-clavulanate 875-125 MG per tablet  Commonly known as:  AUGMENTIN  Take 1 tablet by mouth every 12 (twelve) hours. To help clear the sputum in your lugngs     budesonide-formoterol 160-4.5 MCG/ACT inhaler  Commonly known as:  SYMBICORT  Inhale 2 puffs into the lungs 2 (two) times daily.     carvedilol 6.25 MG tablet  Commonly known as:  COREG  take 1 tablet by mouth twice a day with meals     citalopram 40 MG tablet  Commonly known as:  CELEXA  Take 1 tablet (40 mg total) by mouth daily.     codeine 30 MG tablet  Take 1 tablet (30 mg total) by mouth every 6 (six) hours as needed  (Cough).     glucose blood test strip  Commonly known as:  ACCU-CHEK SMARTVIEW  Use to check blood sugar 1 to 2 times daily. diag code E 11.9. Non- insulin dependent     guaiFENesin 100 MG/5ML Soln  Commonly known as:  ROBITUSSIN  Take 10 mLs (200 mg total) by mouth every 4 (four) hours as needed for cough or to loosen phlegm.     loratadine 10 MG tablet  Commonly known as:  CLARITIN  Take 1 tablet (10 mg total) by mouth daily.     losartan-hydrochlorothiazide 100-25 MG per tablet  Commonly known as:  HYZAAR  Take 1 tablet by mouth daily.     metFORMIN 1000 MG tablet  Commonly known as:  GLUCOPHAGE  Take 1 tablet (1,000 mg total) by mouth 2 (two) times daily with a meal.     nicotine 14 mg/24hr patch  Commonly known as:  NICODERM CQ - dosed in mg/24 hours  Place 1 patch (14 mg total) onto the skin daily.     potassium iodide 1 GM/ML solution  Commonly known as:  SSKI  Take 0.1 mLs (100 mg total) by mouth 3 (three) times daily. To help break up the mucus in your lungs     pravastatin 40 MG tablet    Name: [name] MRN: [id number] DOB: August 26, 1951 64 y.o. PCP: Jones Bales, MD  Date of Admission: 06/20/2015  9:53 PM Date of Discharge: 06/23/2015 Attending Physician: Oval Linsey, MD  Discharge Diagnoses: 1. COPD exacerbation 2. Generalized anxiety disorder 3. Tobacco use 4. Type II diabetes mellitus 5. Essential hypertension   Discharge Medications:   Medication List    STOP taking these medications        guaiFENesin-codeine 100-10 MG/5ML syrup      TAKE these medications        ACCU-CHEK FASTCLIX LANCETS Misc  1 each by Other route See admin instructions. Check blood sugar daily as needed for high blood sugar.     ACCU-CHEK NANO SMARTVIEW W/DEVICE Kit  1 each by Other route See admin instructions. Check blood sugar daily as needed for high blood sugar.     acetaminophen 325 MG tablet  Commonly known as:  TYLENOL  Take 650 mg by mouth 2 (two) times daily as needed for headache.     albuterol 108 (90 BASE) MCG/ACT inhaler  Commonly known as:  VENTOLIN HFA  Inhale 2 puffs into the lungs every 6 (six) hours as needed for wheezing or shortness of breath.     albuterol (2.5 MG/3ML) 0.083% nebulizer solution  Commonly known as:  PROVENTIL  Take 3 mLs (2.5 mg total) by nebulization every 6 (six) hours as needed for wheezing or shortness of breath.     amoxicillin-clavulanate 875-125 MG per tablet  Commonly known as:  AUGMENTIN  Take 1 tablet by mouth every 12 (twelve) hours. To help clear the sputum in your lugngs     budesonide-formoterol 160-4.5 MCG/ACT inhaler  Commonly known as:  SYMBICORT  Inhale 2 puffs into the lungs 2 (two) times daily.     carvedilol 6.25 MG tablet  Commonly known as:  COREG  take 1 tablet by mouth twice a day with meals     citalopram 40 MG tablet  Commonly known as:  CELEXA  Take 1 tablet (40 mg total) by mouth daily.     codeine 30 MG tablet  Take 1 tablet (30 mg total) by mouth every 6 (six) hours as needed  (Cough).     glucose blood test strip  Commonly known as:  ACCU-CHEK SMARTVIEW  Use to check blood sugar 1 to 2 times daily. diag code E 11.9. Non- insulin dependent     guaiFENesin 100 MG/5ML Soln  Commonly known as:  ROBITUSSIN  Take 10 mLs (200 mg total) by mouth every 4 (four) hours as needed for cough or to loosen phlegm.     loratadine 10 MG tablet  Commonly known as:  CLARITIN  Take 1 tablet (10 mg total) by mouth daily.     losartan-hydrochlorothiazide 100-25 MG per tablet  Commonly known as:  HYZAAR  Take 1 tablet by mouth daily.     metFORMIN 1000 MG tablet  Commonly known as:  GLUCOPHAGE  Take 1 tablet (1,000 mg total) by mouth 2 (two) times daily with a meal.     nicotine 14 mg/24hr patch  Commonly known as:  NICODERM CQ - dosed in mg/24 hours  Place 1 patch (14 mg total) onto the skin daily.     potassium iodide 1 GM/ML solution  Commonly known as:  SSKI  Take 0.1 mLs (100 mg total) by mouth 3 (three) times daily. To help break up the mucus in your lungs     pravastatin 40 MG tablet   Name: [name] MRN: [id number] DOB: 09/16/1951 64 y.o. PCP: Jacquelyn S Gill, MD  Date of Admission: 06/20/2015  9:53 PM Date of Discharge: 06/23/2015 Attending Physician: Lawrence Klima, MD  Discharge Diagnoses: 1. COPD exacerbation 2. Generalized anxiety disorder 3. Tobacco use 4. Type II diabetes mellitus 5. Essential hypertension   Discharge Medications:   Medication List    STOP taking these medications        guaiFENesin-codeine 100-10 MG/5ML syrup      TAKE these medications        ACCU-CHEK FASTCLIX LANCETS Misc  1 each by Other route See admin instructions. Check blood sugar daily as needed for high blood sugar.     ACCU-CHEK NANO SMARTVIEW W/DEVICE Kit  1 each by Other route See admin instructions. Check blood sugar daily as needed for high blood sugar.     acetaminophen 325 MG tablet  Commonly known as:  TYLENOL  Take 650 mg by mouth 2 (two) times daily as needed for headache.     albuterol 108 (90 BASE) MCG/ACT inhaler  Commonly known as:  VENTOLIN HFA  Inhale 2 puffs into the lungs every 6 (six) hours as needed for wheezing or shortness of breath.     albuterol (2.5 MG/3ML) 0.083% nebulizer solution  Commonly known as:  PROVENTIL  Take 3 mLs (2.5 mg total) by nebulization every 6 (six) hours as needed for wheezing or shortness of breath.     amoxicillin-clavulanate 875-125 MG per tablet  Commonly known as:  AUGMENTIN  Take 1 tablet by mouth every 12 (twelve) hours. To help clear the sputum in your lugngs     budesonide-formoterol 160-4.5 MCG/ACT inhaler  Commonly known as:  SYMBICORT  Inhale 2 puffs into the lungs 2 (two) times daily.     carvedilol 6.25 MG tablet  Commonly known as:  COREG  take 1 tablet by mouth twice a day with meals     citalopram 40 MG tablet  Commonly known as:  CELEXA  Take 1 tablet (40 mg total) by mouth daily.     codeine 30 MG tablet  Take 1 tablet (30 mg total) by mouth every 6 (six) hours as needed  (Cough).     glucose blood test strip  Commonly known as:  ACCU-CHEK SMARTVIEW  Use to check blood sugar 1 to 2 times daily. diag code E 11.9. Non- insulin dependent     guaiFENesin 100 MG/5ML Soln  Commonly known as:  ROBITUSSIN  Take 10 mLs (200 mg total) by mouth every 4 (four) hours as needed for cough or to loosen phlegm.     loratadine 10 MG tablet  Commonly known as:  CLARITIN  Take 1 tablet (10 mg total) by mouth daily.     losartan-hydrochlorothiazide 100-25 MG per tablet  Commonly known as:  HYZAAR  Take 1 tablet by mouth daily.     metFORMIN 1000 MG tablet  Commonly known as:  GLUCOPHAGE  Take 1 tablet (1,000 mg total) by mouth 2 (two) times daily with a meal.     nicotine 14 mg/24hr patch  Commonly known as:  NICODERM CQ - dosed in mg/24 hours  Place 1 patch (14 mg total) onto the skin daily.     potassium iodide 1 GM/ML solution  Commonly known as:  SSKI  Take 0.1 mLs (100 mg total) by mouth 3 (three) times daily. To help break up the mucus in your lungs     pravastatin 40 MG tablet  

## 2015-06-23 NOTE — Progress Notes (Signed)
Internal Medicine Attending  Date: 06/23/2015  Patient name: [name] Medical record number: [id number] Date of birth: 25-Jan-1951 Age: 64 y.o. Gender: female  I saw and evaluated the patient. I reviewed the resident's note by Dr. Melburn Hake and I agree with the resident's findings and plans as documented in his progress note.  When seen on rounds this morning Mr. Helmuth noted continued shortness of breath and cough productive of sputum. She felt she was able to mobilize her sputum a little better than yesterday. Examination today revealed diffuse bilateral wheezes with fair air movement. She did in excellent job on her pulmonary function tests which reveal a mild COPD picture with an FEV1 of 1.48 which is 68% of predicted. 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 Given the results of the study I feel confident that we have ruled out other pulmonary causes for her dyspnea. Her exam this morning is also consistent with obstructive airway disease. She is being treated for a COPD exacerbation with prednisone 40 mg by mouth daily 5 days and Augmentin for 5 days for the bronchitis that is exacerbated her symptoms  I believe she is stable for discharge home with follow-up in the [facility]. We will give her a short course of codeine but no refills as this is meant for her rib pain associated with the coughing which we presume will improve with therapy. Pulmonary rehabilitation is critical for her as she needs to be empowered to self manage her disease better. She also needs to be encouraged to completely quit smoking and comply with the prescribed bronchodilators including her Symbicort and tiotropium. Her albuterol can be used as an as needed rescue inhaler. Considerable effort in the outpatient clinic will be needed to  encourage her to participate in the care of her obstructive lung disease by remaining compliant with the above.

## 2015-06-23 NOTE — Progress Notes (Signed)
Inpatient Diabetes Program Recommendations  AACE/ADA: New Consensus Statement on Inpatient Glycemic Control (2013)  Target Ranges:  Prepandial:   less than 140 mg/dL      Peak postprandial:   less than 180 mg/dL (1-2 hours)      Critically ill patients:  140 - 180 mg/dL   Inpatient Diabetes Program Recommendations Insulin - Meal Coverage: consider adding Novolog 4 units TID with meals for elevated postprandial elevations Thank you  Raoul Pitch BSN, RN,CDE Inpatient Diabetes Coordinator [phone] (team pager)

## 2015-06-23 NOTE — Progress Notes (Signed)
Patient ID: [name], female   DOB: Nov 16, 1950, 64 y.o.   MRN: [id number]   Subjective: [name] said she slept well last night and we had a nice conversation while she satted 99% on room air. She said she is still coughing up sputum and did so several times while I was in the room. Her anxiety has only been a problem when she becomes short of breath, which mostly occurs when she walks around the house or is near smoke such as from the grill while her husband is cooking. She said she is not ready to go home today but agreed to re-assess after she gets her PFTs this morning.  Objective: Vital signs in last 24 hours: Filed Vitals:   06/22/15 1950 06/22/15 2323 06/23/15 0122 06/23/15 0517  BP:  139/71  148/67  Pulse:  72  67  Temp:  98.2 F (36.8 C)  98.6 F (37 C)  TempSrc:  Oral  Oral  Resp:  20  16  Weight:      SpO2: 96% 99% 97% 100%   General: resting in bed  HEENT: no scleral icterus Cardiac: RRR, no rubs, murmurs or gallops Pulm: diffuse expiratory wheezes posteriorly, worse than her past recent admission Abd: soft, nontender, nondistended, BS present Ext: warm and well perfused, no pedal edema  Medications: I have reviewed the patient's current medications. Scheduled Meds: . amoxicillin-clavulanate  1 tablet Oral Q12H  . budesonide-formoterol  2 puff Inhalation BID  . carvedilol  6.25 mg Oral BID WC  . citalopram  40 mg Oral Daily  . docusate sodium  100 mg Oral BID  . enoxaparin (LOVENOX) injection  50 mg Subcutaneous Q24H  . losartan  100 mg Oral Daily   And  . hydrochlorothiazide  25 mg Oral Daily  . insulin aspart  0-5 Units Subcutaneous QHS  . insulin aspart  0-9 Units Subcutaneous TID WC  . ipratropium-albuterol  3 mL Nebulization Q6H  . loratadine  10 mg Oral Daily  . pantoprazole  40 mg Oral Daily  . pravastatin  40 mg Oral QHS  . predniSONE  40 mg Oral Q breakfast   Continuous Infusions:  PRN Meds:.codeine, guaiFENesin, ipratropium-albuterol,  ondansetron **OR** ondansetron (ZOFRAN) IV  Assessment/Plan: Presumptive COPD exacerbation: She is not requiring oxygen at this time and is breathing well, but she clearly has wheezing on exam. She is scheduled for inpatient PFTs today to better characterize her pathology. There is also a component of anxiety that is contributing to her symptoms; she becomes more and more anxious when she feels she cannot catch her breath. She has an appointment with pulmonary rehabilitation tomorrow morning which I believe will help her feel more in control during her dyspneic episodes and hopefully prevent future admissions. We will change her doxycycline to Augmentin per pharmacy's recommendations; given her frequency of 2 admissions in the last month, and risk factors, they recommend using either a fluoroquinolone or Augmentin over doxycycline (Siddiqi, 2008). Her last EKG showed QTc of 468 so we decided on Augmentin. -Continue duonebs and symbicort -Prednisone 40 mg for 5 days (stop date 8/19) -Augmentin 875/125 mg BID for 5 days (stop date 8/19); we appreciate pharmacy's recommendations -Loratadine 5 mg for allergic component -Codeine 30 mg q6h for cough and rib pain  Anxiety: See above -Continue home dose Celexa 40 mg daily  Diabetes mellitus type II- Well controlled. Last A1c was 6.1 in June 2016; she is on metformin at home -SSI-sensitive with HS coverage  Hypertension:  Well-controlled -Continue home carvedilol 6.25mg  BID and Hyzaar 100-25mg  daily.  Hyperlipidemia -Continue home pravastatin 40mg  daily  Dispo: Disposition is deferred at this time, awaiting improvement of current medical problems.  Anticipated discharge in approximately 0 day(s).   The patient does have a current PCP (Christie Bales, MD) and does need an [facility] hospital follow-up appointment after discharge.  The patient does not know have transportation limitations that hinder transportation to clinic appointments.  .Services  Needed at time of discharge: Y = Yes, Blank = No PT:   OT:   RN:   Equipment:   Other:       Christie Chance, MD 06/23/2015, 8:42 AM

## 2015-06-23 NOTE — Progress Notes (Signed)
Discharge teaching given to pt including activity, diet, and medications. Pt verbalized understanding of all discharge instructions.Vitals are stable. Pt to be escorted out by RN, to be driven home by family.

## 2015-06-24 ENCOUNTER — Telehealth: Payer: Self-pay | Admitting: Pharmacist

## 2015-06-24 ENCOUNTER — Other Ambulatory Visit: Payer: Self-pay | Admitting: Internal Medicine

## 2015-06-24 ENCOUNTER — Encounter (HOSPITAL_COMMUNITY): Payer: Medicare Other

## 2015-06-24 ENCOUNTER — Other Ambulatory Visit: Payer: Self-pay | Admitting: *Deleted

## 2015-06-24 DIAGNOSIS — E119 Type 2 diabetes mellitus without complications: Secondary | ICD-10-CM

## 2015-06-24 DIAGNOSIS — E1169 Type 2 diabetes mellitus with other specified complication: Secondary | ICD-10-CM

## 2015-06-24 DIAGNOSIS — J441 Chronic obstructive pulmonary disease with (acute) exacerbation: Secondary | ICD-10-CM

## 2015-06-24 NOTE — Patient Outreach (Signed)
Referral received last week for transition of care program.  Initial transition of call made on Friday, 06/20/15, however, member was readmitted into the hospital on that same day.  She was discharged yesterday, 06/23/15.  Attempt made again today to make contact with member and initiate the transition of care program without success.  Will make another attempt later this week to contact member.  [name], BSN, [place] [facility]  [facility] Care Manager [phone]

## 2015-06-25 ENCOUNTER — Telehealth: Payer: Self-pay | Admitting: Student-PharmD

## 2015-06-25 MED ORDER — GLIPIZIDE 5 MG PO TABS: 5.0000 mg | ORAL_TABLET | Freq: Every day | ORAL | Status: DC

## 2015-06-25 NOTE — Telephone Encounter (Signed)
14:29 - called no answer, will call again

## 2015-06-25 NOTE — Telephone Encounter (Signed)
15:33 - called , no answer, left message

## 2015-06-25 NOTE — Telephone Encounter (Signed)
14:54 - called to discuss SMBG and predisone, no answer

## 2015-06-25 NOTE — Telephone Encounter (Signed)
Steroid-Induced Hyperglycemia Prevention and Management [name] is a 64 y.o. female who meets criteria for [facility] quality improvement program (diabetes patient prescribed short courses of oral steroids).  A/P Current Regimen  Patient prescribed prednisone 40 mg daily, has 1 more day of therapy remaining. Patient taking prednisone in the AM   Of note, recent hospital admission during which patient received a portion of prednisone therapy. CBGs were in the 300s during her inpatient stay whil  Current DM regimen metformin 1000 mg BID  Home BG Monitoring  Patient does check BG at home and does have a meter at home.  CBGs at home: patient does not remember what her CBGs are running and is unable to provide the information at this time.  A1C prior to steroid course 6.1  Patient does not report s/sx of hyper- or hypoglycemia.   Medication Management                                                                                 CBGs were in the 300s during her inpatient stay while on prednisone  Will go ahead and supply patient with glipizide with specific instructions: Take only if blood sugar is higher than 200  Patient Education  Advised patient to monitor BG while on steroid therapy (at least twice daily prior to first 2 meals of the day).  Patient educated about signs/symptoms and advised to contact clinic if hyper- or hypoglycemic.  Patient did  verbalize understanding of information and regimen by repeating back topics discussed.  Follow-up Will call back and also advised patient to contact clinic if elevated BG.  Daily while on prednisone therapy  [name] J 11:38 AM 06/25/2015

## 2015-06-25 NOTE — Addendum Note (Signed)
Addended by: Forde Dandy on: 06/25/2015 12:25 PM   Modules accepted: Orders

## 2015-06-27 ENCOUNTER — Other Ambulatory Visit: Payer: Self-pay | Admitting: *Deleted

## 2015-06-27 NOTE — Patient Outreach (Signed)
Third attempt made to contact member (number [phone]) to engage in transition of care program.  No answer, mailbox full.  There are no other numbers listed for member.  Will send outreach letter.  If no response in 10 days, will close case.  [name], BSN, [place] [facility]  [facility] Care Manager [phone]

## 2015-06-30 NOTE — Telephone Encounter (Addendum)
Patient states she has not been keeping track of her home BG levels due to competing priorities. Course of prednisone therapy finished.

## 2015-07-03 ENCOUNTER — Encounter: Payer: Self-pay | Admitting: *Deleted

## 2015-07-03 ENCOUNTER — Emergency Department (HOSPITAL_COMMUNITY): Payer: Medicare Other

## 2015-07-03 ENCOUNTER — Inpatient Hospital Stay (HOSPITAL_COMMUNITY)
Admission: EM | Admit: 2015-07-03 | Discharge: 2015-07-08 | DRG: 192 | Disposition: A | Discharge status: Home Health | Payer: Medicare Other | Attending: Internal Medicine | Admitting: Internal Medicine

## 2015-07-03 ENCOUNTER — Encounter: Payer: Self-pay | Admitting: Student-PharmD

## 2015-07-03 ENCOUNTER — Encounter (HOSPITAL_COMMUNITY): Payer: Self-pay

## 2015-07-03 DIAGNOSIS — E785 Hyperlipidemia, unspecified: Secondary | ICD-10-CM | POA: Diagnosis present

## 2015-07-03 DIAGNOSIS — F17210 Nicotine dependence, cigarettes, uncomplicated: Secondary | ICD-10-CM | POA: Diagnosis present

## 2015-07-03 DIAGNOSIS — F329 Major depressive disorder, single episode, unspecified: Secondary | ICD-10-CM | POA: Diagnosis present

## 2015-07-03 DIAGNOSIS — J45909 Unspecified asthma, uncomplicated: Secondary | ICD-10-CM | POA: Diagnosis present

## 2015-07-03 DIAGNOSIS — Z7951 Long term (current) use of inhaled steroids: Secondary | ICD-10-CM | POA: Diagnosis not present

## 2015-07-03 DIAGNOSIS — Z79899 Other long term (current) drug therapy: Secondary | ICD-10-CM | POA: Diagnosis not present

## 2015-07-03 DIAGNOSIS — T486X6A Underdosing of antiasthmatics, initial encounter: Secondary | ICD-10-CM | POA: Diagnosis present

## 2015-07-03 DIAGNOSIS — K219 Gastro-esophageal reflux disease without esophagitis: Secondary | ICD-10-CM | POA: Diagnosis present

## 2015-07-03 DIAGNOSIS — Z853 Personal history of malignant neoplasm of breast: Secondary | ICD-10-CM | POA: Diagnosis not present

## 2015-07-03 DIAGNOSIS — Z9114 Patient's other noncompliance with medication regimen: Secondary | ICD-10-CM

## 2015-07-03 DIAGNOSIS — E669 Obesity, unspecified: Secondary | ICD-10-CM | POA: Diagnosis present

## 2015-07-03 DIAGNOSIS — J441 Chronic obstructive pulmonary disease with (acute) exacerbation: Principal | ICD-10-CM

## 2015-07-03 DIAGNOSIS — G4733 Obstructive sleep apnea (adult) (pediatric): Secondary | ICD-10-CM | POA: Diagnosis present

## 2015-07-03 DIAGNOSIS — E119 Type 2 diabetes mellitus without complications: Secondary | ICD-10-CM | POA: Diagnosis present

## 2015-07-03 DIAGNOSIS — Z91128 Patient's intentional underdosing of medication regimen for other reason: Secondary | ICD-10-CM | POA: Diagnosis present

## 2015-07-03 DIAGNOSIS — I10 Essential (primary) hypertension: Secondary | ICD-10-CM | POA: Diagnosis present

## 2015-07-03 DIAGNOSIS — F411 Generalized anxiety disorder: Secondary | ICD-10-CM | POA: Diagnosis present

## 2015-07-03 DIAGNOSIS — J9620 Acute and chronic respiratory failure, unspecified whether with hypoxia or hypercapnia: Secondary | ICD-10-CM

## 2015-07-03 DIAGNOSIS — Z6836 Body mass index (BMI) 36.0-36.9, adult: Secondary | ICD-10-CM | POA: Diagnosis not present

## 2015-07-03 DIAGNOSIS — J383 Other diseases of vocal cords: Secondary | ICD-10-CM | POA: Diagnosis not present

## 2015-07-03 DIAGNOSIS — F418 Other specified anxiety disorders: Secondary | ICD-10-CM

## 2015-07-03 DIAGNOSIS — R0602 Shortness of breath: Secondary | ICD-10-CM | POA: Diagnosis not present

## 2015-07-03 LAB — BASIC METABOLIC PANEL
Anion gap: 11 (ref 5–15)
BUN: 6 mg/dL (ref 6–20)
CO2: 28 mmol/L (ref 22–32)
Calcium: 9.3 mg/dL (ref 8.9–10.3)
Chloride: 101 mmol/L (ref 101–111)
Creatinine, Ser: 0.93 mg/dL (ref 0.44–1.00)
GFR calc Af Amer: >60 mL/min (ref >60)
GLUCOSE: 147 mg/dL — AB (ref 65–99)
POTASSIUM: 3.9 mmol/L (ref 3.5–5.1)
Sodium: 140 mmol/L (ref 135–145)

## 2015-07-03 LAB — CBC
HEMATOCRIT: 35.9 % — AB (ref 36.0–46.0)
Hemoglobin: 12 g/dL (ref 12.0–15.0)
MCH: 29 pg (ref 26.0–34.0)
MCHC: 33.4 g/dL (ref 30.0–36.0)
MCV: 86.7 fL (ref 78.0–100.0)
Platelets: 302 10*3/uL (ref 150–400)
RBC: 4.14 MIL/uL (ref 3.87–5.11)
RDW: 13.6 % (ref 11.5–15.5)
WBC: 8.9 10*3/uL (ref 4.0–10.5)

## 2015-07-03 LAB — GLUCOSE, CAPILLARY
GLUCOSE-CAPILLARY: 359 mg/dL — AB (ref 65–99)
Glucose-Capillary: 258 mg/dL — ABNORMAL HIGH (ref 65–99)
Glucose-Capillary: 266 mg/dL — ABNORMAL HIGH (ref 65–99)
Glucose-Capillary: 335 mg/dL — ABNORMAL HIGH (ref 65–99)
Glucose-Capillary: 370 mg/dL — ABNORMAL HIGH (ref 65–99)
Glucose-Capillary: 388 mg/dL — ABNORMAL HIGH (ref 65–99)

## 2015-07-03 LAB — MRSA PCR SCREENING: MRSA BY PCR: NEGATIVE

## 2015-07-03 LAB — TSH: TSH: 0.316 u[IU]/mL — AB (ref 0.350–4.500)

## 2015-07-03 MED ORDER — CITALOPRAM HYDROBROMIDE 20 MG PO TABS: 40.0000 mg | ORAL_TABLET | Freq: Every day | ORAL | Status: DC

## 2015-07-03 MED ORDER — CETYLPYRIDINIUM CHLORIDE 0.05 % MT LIQD
7.0000 mL | Freq: Two times a day (BID) | OROMUCOSAL | Status: DC
  Administered 2015-07-03 – 2015-07-08 (×10): 7 mL via OROMUCOSAL

## 2015-07-03 MED ORDER — POTASSIUM IODIDE 1 GM/ML PO SOLN
100.0000 mg | Freq: Two times a day (BID) | ORAL | Status: DC
  Filled 2015-07-03: qty 30

## 2015-07-03 MED ORDER — INSULIN ASPART 100 UNIT/ML ~~LOC~~ SOLN
0.0000 [IU] | SUBCUTANEOUS | Status: DC
  Administered 2015-07-03: 5 [IU] via SUBCUTANEOUS
  Administered 2015-07-03: 9 [IU] via SUBCUTANEOUS
  Administered 2015-07-03: 2 [IU] via SUBCUTANEOUS
  Administered 2015-07-03: 7 [IU] via SUBCUTANEOUS
  Administered 2015-07-03: 5 [IU] via SUBCUTANEOUS
  Administered 2015-07-04 (×2): 2 [IU] via SUBCUTANEOUS
  Administered 2015-07-04: 9 [IU] via SUBCUTANEOUS
  Administered 2015-07-04: 5 [IU] via SUBCUTANEOUS
  Administered 2015-07-04: 9 [IU] via SUBCUTANEOUS

## 2015-07-03 MED ORDER — LEVOFLOXACIN IN D5W 750 MG/150ML IV SOLN: 750.0000 mg | Freq: Every day | INTRAVENOUS | Status: DC

## 2015-07-03 MED ORDER — HYDROCHLOROTHIAZIDE 25 MG PO TABS
25.0000 mg | ORAL_TABLET | Freq: Every day | ORAL | Status: DC
  Administered 2015-07-03 – 2015-07-08 (×6): 25 mg via ORAL
  Filled 2015-07-03 (×7): qty 1

## 2015-07-03 MED ORDER — CITALOPRAM HYDROBROMIDE 20 MG PO TABS
20.0000 mg | ORAL_TABLET | Freq: Every day | ORAL | Status: DC
  Administered 2015-07-03 – 2015-07-07 (×5): 20 mg via ORAL
  Filled 2015-07-03 (×5): qty 1

## 2015-07-03 MED ORDER — SODIUM CHLORIDE 0.9 % IJ SOLN
3.0000 mL | Freq: Two times a day (BID) | INTRAMUSCULAR | Status: DC
  Administered 2015-07-03 – 2015-07-08 (×10): 3 mL via INTRAVENOUS

## 2015-07-03 MED ORDER — ZOLPIDEM TARTRATE 5 MG PO TABS
5.0000 mg | ORAL_TABLET | Freq: Once | ORAL | Status: AC
  Administered 2015-07-04: 5 mg via ORAL
  Filled 2015-07-03: qty 1

## 2015-07-03 MED ORDER — FAMOTIDINE 20 MG PO TABS
20.0000 mg | ORAL_TABLET | Freq: Every day | ORAL | Status: DC
  Administered 2015-07-03 – 2015-07-08 (×6): 20 mg via ORAL
  Filled 2015-07-03 (×6): qty 1

## 2015-07-03 MED ORDER — BUSPIRONE HCL 15 MG PO TABS
7.5000 mg | ORAL_TABLET | Freq: Two times a day (BID) | ORAL | Status: DC
  Administered 2015-07-03 – 2015-07-07 (×9): 7.5 mg via ORAL
  Filled 2015-07-03 (×9): qty 1

## 2015-07-03 MED ORDER — BUPROPION HCL ER (XL) 150 MG PO TB24
150.0000 mg | ORAL_TABLET | Freq: Every day | ORAL | Status: DC
  Administered 2015-07-04 – 2015-07-08 (×5): 150 mg via ORAL
  Filled 2015-07-03 (×7): qty 1

## 2015-07-03 MED ORDER — BUPROPION HCL ER (XL) 150 MG PO TB24
150.0000 mg | ORAL_TABLET | Freq: Every day | ORAL | Status: DC
  Administered 2015-07-03: 150 mg via ORAL
  Filled 2015-07-03: qty 1

## 2015-07-03 MED ORDER — CODEINE SULFATE 30 MG PO TABS
30.0000 mg | ORAL_TABLET | Freq: Four times a day (QID) | ORAL | Status: DC | PRN
  Administered 2015-07-03 – 2015-07-04 (×4): 30 mg via ORAL
  Filled 2015-07-03 (×4): qty 2

## 2015-07-03 MED ORDER — PRAVASTATIN SODIUM 40 MG PO TABS: 40.0000 mg | ORAL_TABLET | Freq: Every day | ORAL | Status: DC

## 2015-07-03 MED ORDER — METOPROLOL SUCCINATE ER 25 MG PO TB24
12.5000 mg | ORAL_TABLET | Freq: Every day | ORAL | Status: DC
  Administered 2015-07-03 – 2015-07-08 (×6): 12.5 mg via ORAL
  Filled 2015-07-03 (×6): qty 1

## 2015-07-03 MED ORDER — MOMETASONE FURO-FORMOTEROL FUM 200-5 MCG/ACT IN AERO
2.0000 | INHALATION_SPRAY | Freq: Two times a day (BID) | RESPIRATORY_TRACT | Status: DC
  Administered 2015-07-03 – 2015-07-08 (×10): 2 via RESPIRATORY_TRACT
  Filled 2015-07-03 (×2): qty 8.8

## 2015-07-03 MED ORDER — METHYLPREDNISOLONE SODIUM SUCC 125 MG IJ SOLR
60.0000 mg | Freq: Two times a day (BID) | INTRAMUSCULAR | Status: DC
  Administered 2015-07-03: 60 mg via INTRAVENOUS
  Filled 2015-07-03: qty 2

## 2015-07-03 MED ORDER — LOSARTAN POTASSIUM-HCTZ 100-25 MG PO TABS: 1.0000 | ORAL_TABLET | Freq: Every day | ORAL | Status: DC

## 2015-07-03 MED ORDER — BUDESONIDE-FORMOTEROL FUMARATE 160-4.5 MCG/ACT IN AERO
2.0000 | INHALATION_SPRAY | Freq: Two times a day (BID) | RESPIRATORY_TRACT | Status: DC
  Administered 2015-07-03: 2 via RESPIRATORY_TRACT
  Filled 2015-07-03: qty 6

## 2015-07-03 MED ORDER — ASPIRIN EC 81 MG PO TBEC
81.0000 mg | DELAYED_RELEASE_TABLET | Freq: Every day | ORAL | Status: DC
  Administered 2015-07-03 – 2015-07-08 (×6): 81 mg via ORAL
  Filled 2015-07-03 (×6): qty 1

## 2015-07-03 MED ORDER — NICOTINE 7 MG/24HR TD PT24
7.0000 mg | MEDICATED_PATCH | Freq: Every day | TRANSDERMAL | Status: DC
  Administered 2015-07-03 – 2015-07-07 (×5): 7 mg via TRANSDERMAL
  Filled 2015-07-03 (×5): qty 1

## 2015-07-03 MED ORDER — METHYLPREDNISOLONE SODIUM SUCC 125 MG IJ SOLR
125.0000 mg | Freq: Once | INTRAMUSCULAR | Status: AC
  Administered 2015-07-03: 125 mg via INTRAVENOUS
  Filled 2015-07-03: qty 2

## 2015-07-03 MED ORDER — PANTOPRAZOLE SODIUM 40 MG PO TBEC: 40.0000 mg | DELAYED_RELEASE_TABLET | Freq: Every day | ORAL | Status: DC

## 2015-07-03 MED ORDER — PREDNISONE 20 MG PO TABS
20.0000 mg | ORAL_TABLET | Freq: Every day | ORAL | Status: DC
  Administered 2015-07-04 – 2015-07-05 (×2): 20 mg via ORAL
  Filled 2015-07-03 (×3): qty 1

## 2015-07-03 MED ORDER — ALBUTEROL SULFATE (2.5 MG/3ML) 0.083% IN NEBU
5.0000 mg | INHALATION_SOLUTION | Freq: Once | RESPIRATORY_TRACT | Status: AC
  Administered 2015-07-03: 5 mg via RESPIRATORY_TRACT

## 2015-07-03 MED ORDER — LORAZEPAM 0.5 MG PO TABS
0.5000 mg | ORAL_TABLET | ORAL | Status: DC | PRN
  Filled 2015-07-03: qty 1

## 2015-07-03 MED ORDER — ENOXAPARIN SODIUM 60 MG/0.6ML ~~LOC~~ SOLN
50.0000 mg | SUBCUTANEOUS | Status: DC
Start: 2015-07-03 — End: 2015-07-08
  Administered 2015-07-03 – 2015-07-08 (×6): 50 mg via SUBCUTANEOUS
  Filled 2015-07-03 (×6): qty 0.6

## 2015-07-03 MED ORDER — IPRATROPIUM-ALBUTEROL 0.5-2.5 (3) MG/3ML IN SOLN
3.0000 mL | RESPIRATORY_TRACT | Status: DC
  Administered 2015-07-03 (×2): 3 mL via RESPIRATORY_TRACT
  Filled 2015-07-03 (×3): qty 3

## 2015-07-03 MED ORDER — CODEINE SULFATE 30 MG PO TABS: 30.0000 mg | ORAL_TABLET | Freq: Four times a day (QID) | ORAL | Status: DC | PRN

## 2015-07-03 MED ORDER — ATORVASTATIN CALCIUM 80 MG PO TABS
80.0000 mg | ORAL_TABLET | Freq: Every day | ORAL | Status: DC
  Administered 2015-07-03 – 2015-07-07 (×5): 80 mg via ORAL
  Filled 2015-07-03 (×5): qty 1

## 2015-07-03 MED ORDER — ALBUTEROL SULFATE (2.5 MG/3ML) 0.083% IN NEBU
INHALATION_SOLUTION | RESPIRATORY_TRACT | Status: AC
  Filled 2015-07-03: qty 6

## 2015-07-03 MED ORDER — IPRATROPIUM-ALBUTEROL 0.5-2.5 (3) MG/3ML IN SOLN
3.0000 mL | Freq: Four times a day (QID) | RESPIRATORY_TRACT | Status: DC
  Administered 2015-07-03 – 2015-07-05 (×7): 3 mL via RESPIRATORY_TRACT
  Filled 2015-07-03 (×6): qty 3

## 2015-07-03 MED ORDER — ALBUTEROL SULFATE (2.5 MG/3ML) 0.083% IN NEBU: 2.5000 mg | INHALATION_SOLUTION | RESPIRATORY_TRACT | Status: DC | PRN

## 2015-07-03 MED ORDER — ALBUTEROL (5 MG/ML) CONTINUOUS INHALATION SOLN
10.0000 mg/h | INHALATION_SOLUTION | Freq: Once | RESPIRATORY_TRACT | Status: AC
  Administered 2015-07-03: 10 mg/h via RESPIRATORY_TRACT
  Filled 2015-07-03 (×2): qty 40

## 2015-07-03 MED ORDER — LOSARTAN POTASSIUM 50 MG PO TABS
100.0000 mg | ORAL_TABLET | Freq: Every day | ORAL | Status: DC
  Administered 2015-07-03 – 2015-07-08 (×6): 100 mg via ORAL
  Filled 2015-07-03 (×6): qty 2

## 2015-07-03 MED ORDER — CHLORHEXIDINE GLUCONATE 0.12 % MT SOLN
15.0000 mL | Freq: Two times a day (BID) | OROMUCOSAL | Status: DC
  Administered 2015-07-03 – 2015-07-08 (×11): 15 mL via OROMUCOSAL
  Filled 2015-07-03 (×7): qty 15

## 2015-07-03 NOTE — Patient Outreach (Signed)
Member noted to be admitted into hospital early this morning for shortness of breath, COPD exacerbation, which makes the 4th admission since June.  This care manager has attempted 3 times to contact member at number provided [phone]) without success.  Outreach letter has been sent.  Hospital liaison notified of admission.  [name], BSN, [place] Management  [facility] Care Manager [phone]

## 2015-07-03 NOTE — Consult Note (Signed)
   [facility] Inpatient Consult   07/03/2015  [name] Sep 29, 1951 [id number] Referral was received from [facility] nurse regarding the patient's re-admission for COPD exacerbation.  Patient evaluated for community based chronic disease management services with [place] [facility] Program as a benefit of patient's [employer]. Spoke with patient at length at the  bedside to explain [place] Management services. Patient endorses that her primary care provider is the [facility] Internal Medicine clinic.  Patient states that she is upset about being re-admitted for COPD exacerbation.  She states she was not well at her last discharge on 06/23/15.  She states she has struggled with her breathing since her last admission.  She states she was worsening this week with the burial of her mother-in-law on Monday. "I've had a who lot going on since discharging."   She endorses that her cell phone number is [phone].  Explained that [place] has been attempting to reach her.  She verbalized not answering unfamiliar calls.  Patient states she is very interested in [place] [facility] services for nursing.  She states she would love someone following up with her and checking her progress stating, "maybe if I had the follow up I wouldn't have had to come back but I was so sick."  Patient endorses that she is adherent to her medications and tries to avoid admission.  Consent form signed and folder with [place] Management information given as well.   Patient will receive post discharge transition of care call and will be evaluated for monthly home visits for assessments and disease process education. Made Inpatient Case Manager aware that [place] Management following. Of note, [facility] Care Management services does not replace or interfere with any services that are arranged by inpatient case management or social work.  For additional questions or referrals please contact:   Natividad Brood, RN BSN  [facility] Liaison  [phone] business mobile phone

## 2015-07-03 NOTE — Progress Notes (Signed)
Patient ID: [name], female   DOB: [birth year], 64 y.o.   MRN: [id number]   Verified at-home medication use and recommended several changes to patient care.  COPD Exacerbations, uncontrolled, frequent Filled Symbicort on 8/5, Received Spiriva samples from manufacturer and [facility], No record of Albuterol MDI, and Duoneb ordered but lost in paperwork shuffle.  1.  Rx for Albuterol MDI sent to pharmacy, sample will be given to patient prior to discharge 2.  Duoneb authorization will be signed and completed for home nebulizer use 3.  Switch Symbicort to Dulera MDI 200/5 2 puff BID for stronger steroid option. 4.  Check use of Spiriva Handihaler DPI and continue  Tobacco Use, controlled, assess readiness to quit 1.  [facility] - set patient up with service for smoking cessation counseling 2.  Switch Celexa to Bupropion XL to help with Smoking Cessation  Taper Celexa - 20 mg daily x 7d, then 20 mg QOD x 7d  Start Bupropion XL 150 mg qAM x 7d, then 300 mg qAM  Add Buspirone 7.5 mg BID x 7d, then 10 mg BID x 7d and titrate as needed to max 30 mg BID  Cardiovascular Risk 1. Restart ASA-81, lost during prior admission 2. Switch Pravastatin 40 mg to Atorvastatin 80 mg (indicated for high intensity) 3. Consider switching Carvedilol to Metoprolol Succinate (for beta-1 selectivity with COPD and LABAs)   Viann Fish PharmD candidate, 2017

## 2015-07-03 NOTE — H&P (Signed)
Date: 07/03/2015               Patient Name:  [name] MRN: [id number]  DOB: October 10, 1951 Age / Sex: 64 y.o., female   PCP: Christie Bales, MD         Medical Service: Internal Medicine Teaching Service         Attending Physician: Dr. Bartholomew Crews, MD    First Contact: Dr. [name] Pager: [phone]  Second Contact: Dr. Duwaine Maxin Pager: [phone]       After Hours (After 5p/  First Contact Pager: [phone]  weekends / holidays): Second Contact Pager: [phone]   Chief Complaint: Shortness of breath  History of Present Illness: [name] is a 64 y.o. AA female with past medical history of COPD (confirmed with PFTs during last admission), type 2 diabetes mellitus, hypertension, hyperlipidemia, history of breast cancer who presented to the emergency department with shortness of breath.  During our encounter she was still on BiPap in the emergency department but states that her symptoms began on Sunday and have progressively worsened over the course of the past few days.  She reports increased production of sputum associated with cough.  Does not endorse any blood in her sputum.  Denies any recent fever, chills, nausea, vomiting, or diarrhea.  Patient was recently discharged from this hospital on August 15 when she had been admitted for COPD exacerbation.  Following this discharge she reports having finished the steroid and antibiotic course she had been sent home on.  Prior to arrival in the ED, patient tried to relieve her symptoms with home nebulizer treatments with minimal relief of symptoms.  While in the ED, she had a 1-view chest xray that showed no active cardiopulmonary disease.  Her white blood cell count is normal, afebrile, saturating O2 at 100% on BiPap.  Meds: No current facility-administered medications for this encounter.   Current Outpatient Prescriptions  Medication Sig Dispense Refill  . acetaminophen (TYLENOL) 325 MG tablet Take 650 mg by mouth  2 (two) times daily as needed for headache.    . albuterol (PROVENTIL) (2.5 MG/3ML) 0.083% nebulizer solution Take 3 mLs (2.5 mg total) by nebulization every 6 (six) hours as needed for wheezing or shortness of breath. 360 mL 3  . albuterol (VENTOLIN HFA) 108 (90 BASE) MCG/ACT inhaler Inhale 2 puffs into the lungs every 6 (six) hours as needed for wheezing or shortness of breath. 1 Inhaler 3  . budesonide-formoterol (SYMBICORT) 160-4.5 MCG/ACT inhaler Inhale 2 puffs into the lungs 2 (two) times daily. 1 Inhaler 12  . carvedilol (COREG) 6.25 MG tablet take 1 tablet by mouth twice a day with meals 60 tablet 3  . citalopram (CELEXA) 40 MG tablet Take 1 tablet (40 mg total) by mouth daily. 30 tablet 3  . loratadine (CLARITIN) 10 MG tablet Take 1 tablet (10 mg total) by mouth daily. 90 tablet 3  . losartan-hydrochlorothiazide (HYZAAR) 100-25 MG per tablet Take 1 tablet by mouth daily. 30 tablet 2  . metFORMIN (GLUCOPHAGE) 1000 MG tablet Take 1 tablet (1,000 mg total) by mouth 2 (two) times daily with a meal. 60 tablet 11  . pravastatin (PRAVACHOL) 40 MG tablet TAKE 1 TABLET BY MOUTH DAILY (Patient taking differently: TAKE 1 TABLET BY MOUTH DAILY AT BEDTIME) 90 tablet 0  . sodium chloride (OCEAN) 0.65 % SOLN nasal spray Place 2 sprays into both nostrils 3 (three) times daily. (Patient taking differently: Place 1 spray into both nostrils  3 (three) times daily. ) 15 mL 3  . tiotropium (SPIRIVA) 18 MCG inhalation capsule Inhale 2 puffs daily for COPD maintenance (Patient taking differently: Place 18 mcg into inhaler and inhale daily. ) 30 capsule 12  . ACCU-CHEK FASTCLIX LANCETS MISC 1 each by Other route See admin instructions. Check blood sugar daily as needed for high blood sugar.    Marland Kitchen amoxicillin-clavulanate (AUGMENTIN) 875-125 MG per tablet Take 1 tablet by mouth every 12 (twelve) hours. To help clear the sputum in your lugngs (Patient not taking: Reported on 07/03/2015) 8 tablet 0  . Blood Glucose  Monitoring Suppl (ACCU-CHEK NANO SMARTVIEW) W/DEVICE KIT 1 each by Other route See admin instructions. Check blood sugar daily as needed for high blood sugar.    . codeine 30 MG tablet Take 1 tablet (30 mg total) by mouth every 6 (six) hours as needed (Cough). (Patient not taking: Reported on 07/03/2015) 30 tablet 0  . glipiZIDE (GLUCOTROL) 5 MG tablet Take 1 tablet (5 mg total) by mouth daily before breakfast. TAKE ONLY IF BLOOD SUGAR IS HIGHER THAN 200. MONITOR BLOOD SUGAR AT LEAST 2 TIMES DAILY WHILE TAKING PREDNISONE. 3 tablet 0  . glucose blood (ACCU-CHEK SMARTVIEW) test strip Use to check blood sugar 1 to 2 times daily. diag code E 11.9. Non- insulin dependent 100 each 6  . guaiFENesin (ROBITUSSIN) 100 MG/5ML SOLN Take 10 mLs (200 mg total) by mouth every 4 (four) hours as needed for cough or to loosen phlegm. (Patient not taking: Reported on 07/03/2015) 1200 mL 0  . nicotine (NICODERM CQ - DOSED IN MG/24 HOURS) 14 mg/24hr patch Place 1 patch (14 mg total) onto the skin daily. (Patient not taking: Reported on 07/03/2015) 28 patch 0  . potassium iodide (SSKI) 1 GM/ML solution Take 0.1 mLs (100 mg total) by mouth 3 (three) times daily. To help break up the mucus in your lungs (Patient not taking: Reported on 07/03/2015) 237 mL 0  . [DISCONTINUED] albuterol (PROVENTIL,VENTOLIN) 90 MCG/ACT inhaler Inhale 2 puffs into the lungs every 6 (six) hours as needed for wheezing. 17 g 12    Allergies: Allergies as of 07/03/2015 - Review Complete 07/03/2015  Allergen Reaction Noted  . Ace inhibitors Swelling 12/26/2014  . Flonase [fluticasone] Other (See Comments) 12/25/2014   Past Medical History  Diagnosis Date  . Hyperlipidemia   . Hypertension   . Tobacco abuse   . COPD (chronic obstructive pulmonary disease)     History of multiple hospital admissions for exercabation   . Asthma   . Breast cancer 1991    s/p lumpectomy, chemotherapy and radiation therapy in 1991. Mammogram in 2007 was normal.  .  Sigmoid diverticulitis 80/2008  . Anxiety   . Depression   . Obesity   . GERD (gastroesophageal reflux disease)   . Heart murmur 10/05/11    "first time I ever heard I had one was today"  . Pneumonia   . Shortness of breath 10/05/11    "at rest; lying down; w/exertion"  . Diabetes mellitus   . Bronchitis     h/o  . Diarrhea     h/o  . Constipated     h/o  . COPD with exacerbation 04/06/2009    Qualifier: Diagnosis of  By: Eyvonne Mechanic MD, Doyne Keel     Past Surgical History  Procedure Laterality Date  . Dobutamine stress echo  08/2004    Inferior ischemia, normal LV systolic function, no significant CAD  . Abdominal hysterectomy    .  Breast surgery  1991    lumphectomy right breast  . Neck surgery  2012    "Dr. Lynann Bologna  put plate in; did something to my vertebrae"   Family History  Problem Relation Age of Onset  . Cancer Mother    Social History   Social History  . Marital Status: Married    Spouse Name: N/A  . Number of Children: N/A  . Years of Education: 12   Occupational History  .  Unemployed   Social History Main Topics  . Smoking status: Current Some Day Smoker -- 0.10 packs/day for 40 years    Types: Cigarettes    Last Attempt to Quit: 09/11/2014  . Smokeless tobacco: Never Used     Comment: slowly quitting.DOWN TO ABOUT 6 CIGARETTES PER DAY  . Alcohol Use: 2.4 oz/week    4 Cans of beer per week     Comment: "only on the weekends"  . Drug Use: No  . Sexual Activity: Not on file   Other Topics Concern  . Not on file   Social History Narrative   Lives in [place] with her husband.   Takes care of 3 grand children.   Trying to find a job, has financial difficulties.          Review of Systems: Review of systems not obtained due to patient factors. patient on BiPap  Physical Exam: Blood pressure 184/83, pulse 87, temperature 98.6 F (37 C), temperature source Oral, resp. rate 20, height 5' 7" (1.702 m), weight 220 lb (99.791 kg), SpO2 100 %.    Physical Exam  Constitutional: She appears well-developed and well-nourished. She appears distressed.  HENT:  Head: Normocephalic and atraumatic.  Eyes: EOM are normal.  Neck: Normal range of motion.  Cardiovascular: Normal rate, regular rhythm, normal heart sounds and intact distal pulses.   Pulmonary/Chest: She has wheezes.  Breath sounds are coarse throughout with wheezes appreciated in b/l lung fields   Neurological: She is alert.  Skin: Skin is warm and dry. She is not diaphoretic.     Lab results: Basic Metabolic Panel:  Recent Labs  07/03/15 0144  NA 140  K 3.9  CL 101  CO2 28  GLUCOSE 147*  BUN 6  CREATININE 0.93  CALCIUM 9.3   Liver Function Tests: No results for input(s): AST, ALT, ALKPHOS, BILITOT, PROT, ALBUMIN in the last 72 hours. No results for input(s): LIPASE, AMYLASE in the last 72 hours. No results for input(s): AMMONIA in the last 72 hours. CBC:  Recent Labs  07/03/15 0144  WBC 8.9  HGB 12.0  HCT 35.9*  MCV 86.7  PLT 302   Imaging results:  Dg Chest Port 1 View  07/03/2015   CLINICAL DATA:  Initial evaluation for acute shortness of breath. History of COPD.  EXAM: PORTABLE CHEST - 1 VIEW  COMPARISON:  Prior radiograph from 06/09/2015  FINDINGS: There is accentuation of the cardiac silhouette, likely related AP technique.  Lungs are normally inflated. Attenuation the pulmonary markings likely related underlying COPD. No focal infiltrates identified. No overt pulmonary edema. No definite pleural effusion on this single frontal projection. No pneumothorax.  No acute osseus abnormality. ACDF overlies the lower cervical spine. Surgical clips overlie the right axilla.  IMPRESSION: 1. No active cardiopulmonary disease. 2. COPD.   Electronically Signed   By: Jeannine Boga M.D.   On: 07/03/2015 04:21    Assessment & Plan by Problem: Active Problems:   COPD exacerbation  64 y.o. AA female  with past medical history of COPD (confirmed with PFTs  during last admission), type 2 diabetes mellitus, hypertension, hyperlipidemia, history of breast cancer who presented to the emergency department with shortness of breath.   COPD Exacerbation -Patient with multiple admissions for COPD exacerbations.  During last admission she had PFTs performed which showed an FEV1 of 68%, categorizing her as having moderate COPD, Gold Stage 2.  She was treated with a course of prednisone 37m PO daily x 5 days and Augmentin x 5 days.  Was doing okay after discharge until Sunday when she had an exacerbation of wheezing and shortness of breath that progressively declined.   Given that she presents with increased dyspnea and increased sputum volume, would classify this as a moderate-severe exacerbation warranting antibiotic coverage for the time being and, given her recent hospitalization, need to cover appropriately. -Levaquin per pharmacy -albuterol nebs q2h prn -symbicort bid -duoneb q4h -Solu-medrol 665mIV q12h -BiPap prn -NPO -continue to encourage smoking cessation -patient would benefit from pulmonary rehab in helping her to better manage her disease process  Anxiety -continue home meds Celexa 4043maily   Diabetes mellitus type II -well controlled. Last A1c was 6.1 in June 2016 -home meds are metformin and glipizide -SSI with CBG q4h while NPO  Hypertension -Hyzaar 100-12m81mily.  Hold Coreg. -continue home meds  Hyperlipidemia -on pravastatin 40mg10mly at home.  -continue home meds -last lipid panel August 2015 with TC 170, TG 166, HDL 46, LDL 91.  Diet: NPO  DVT PPX: Lovenox  Code: Full  Dispo: Disposition is deferred at this time, awaiting improvement of current medical problems. Anticipated discharge in approximately 1-2 day(s).   The patient does have a current PCP (JacquJones [name] and [facility].  The patient does not have transportation limitations that hinder  transportation to clinic appointments.  Signed: AndreJule Ser8/25/2016, 5:06 AM

## 2015-07-03 NOTE — Progress Notes (Signed)
Inpatient Diabetes Program Recommendations  AACE/ADA: New Consensus Statement on Inpatient Glycemic Control (2013)  Target Ranges:  Prepandial:   less than 140 mg/dL      Peak postprandial:   less than 180 mg/dL (1-2 hours)      Critically ill patients:  140 - 180 mg/dL   Results for [name], [name] (MRN [id number]) as of 07/03/2015 08:30  Ref. Range 07/03/2015 06:26 07/03/2015 08:02  Glucose-Capillary Latest Ref Range: 65-99 mg/dL 266 (H) 258 (H)   Diabetes history: DM 2 Outpatient Diabetes medications: Metformin 1,000 mg BID, Glipizide 5mg  Daily Current orders for Inpatient glycemic control: Novolog Sensitive  Inpatient Diabetes Program Recommendations Correction (SSI): Due to steroids, please consider increasing correction to Novolog Moderate or Resistant scale Q4hrs.  Thanks,  Tama Headings RN, MSN, [facility] Inpatient Diabetes Coordinator Team Pager [phone]

## 2015-07-03 NOTE — ED Notes (Signed)
Pt has COPD and started having SOB and trouble breathing yesterday. Got worse last night at 1900. Tried to use her inhalers and neb at home but hasn't helped.

## 2015-07-03 NOTE — ED Provider Notes (Signed)
CSN: [id number]     Arrival date & time 07/03/15  0127 History  This chart was scribed for Linton Flemings, MD by Hansel Feinstein, ED Scribe. This patient was seen in room D32C/D32C and the patient's care was started at 1:53 AM.     Chief Complaint  Patient presents with  . Shortness of Breath  . COPD   The history is provided by the patient. No language interpreter was used.    HPI Comments: [name] is a 64 y.o. female with Hx of HLD, HTN, COPD, asthma, breast cancer, sigmoid diverticulitis, GERD, heart murmur, pneumonia, DM who presents to the Emergency Department complaining of moderate SOB onset yesterday and worsened last night. Pt states she took 4 nebulizer treatments PTA with mild relief. Pt was seen in the ED on 06/20/15 and 06/03/15 for SOB. She notes no Hx of ventilator use, BIPAP use. Pt states her most recent hospitalization for COPD was last week. Pt states she is not longer on steroids from this visit. PCP is Dr. Gordy Levan. Pt is still smoking  Past Medical History  Diagnosis Date  . Hyperlipidemia   . Hypertension   . Tobacco abuse   . COPD (chronic obstructive pulmonary disease)     History of multiple hospital admissions for exercabation   . Asthma   . Breast cancer 1991    s/p lumpectomy, chemotherapy and radiation therapy in 1991. Mammogram in 2007 was normal.  . Sigmoid diverticulitis 80/2008  . Anxiety   . Depression   . Obesity   . GERD (gastroesophageal reflux disease)   . Heart murmur 10/05/11    "first time I ever heard I had one was today"  . Pneumonia   . Shortness of breath 10/05/11    "at rest; lying down; w/exertion"  . Diabetes mellitus   . Bronchitis     h/o  . Diarrhea     h/o  . Constipated     h/o  . COPD with exacerbation 04/06/2009    Qualifier: Diagnosis of  By: Eyvonne Mechanic MD, Doyne Keel     Past Surgical History  Procedure Laterality Date  . Dobutamine stress echo  08/2004    Inferior ischemia, normal LV systolic function, no significant CAD  .  Abdominal hysterectomy    . Breast surgery  1991    lumphectomy right breast  . Neck surgery  2012    "Dr. Lynann Bologna  put plate in; did something to my vertebrae"   Family History  Problem Relation Age of Onset  . Cancer Mother    Social History  Substance Use Topics  . Smoking status: Current Some Day Smoker -- 0.10 packs/day for 40 years    Types: Cigarettes    Last Attempt to Quit: 09/11/2014  . Smokeless tobacco: Never Used     Comment: slowly quitting.DOWN TO ABOUT 6 CIGARETTES PER DAY  . Alcohol Use: 2.4 oz/week    4 Cans of beer per week     Comment: "only on the weekends"   OB History    No data available     Review of Systems  Unable to perform ROS: Severe respiratory distress  Respiratory: Positive for shortness of breath.   All other systems reviewed and are negative.   Allergies  Ace inhibitors and Flonase  Home Medications   Prior to Admission medications   Medication Sig Start Date End Date Taking? Authorizing Provider  ACCU-CHEK FASTCLIX LANCETS MISC 1 each by Other route See admin instructions. Check blood sugar  daily as needed for high blood sugar.    Historical Provider, MD  acetaminophen (TYLENOL) 325 MG tablet Take 650 mg by mouth 2 (two) times daily as needed for headache.    Historical Provider, MD  albuterol (PROVENTIL) (2.5 MG/3ML) 0.083% nebulizer solution Take 3 mLs (2.5 mg total) by nebulization every 6 (six) hours as needed for wheezing or shortness of breath. 03/06/15   Lucious Groves, DO  albuterol (VENTOLIN HFA) 108 (90 BASE) MCG/ACT inhaler Inhale 2 puffs into the lungs every 6 (six) hours as needed for wheezing or shortness of breath. 08/07/14   Jones Bales, MD  amoxicillin-clavulanate (AUGMENTIN) 875-125 MG per tablet Take 1 tablet by mouth every 12 (twelve) hours. To help clear the sputum in your lugngs 06/23/15   [name], MD  Blood Glucose Monitoring Suppl (ACCU-CHEK NANO SMARTVIEW) W/DEVICE KIT 1 each by Other route See admin  instructions. Check blood sugar daily as needed for high blood sugar.    Historical Provider, MD  budesonide-formoterol (SYMBICORT) 160-4.5 MCG/ACT inhaler Inhale 2 puffs into the lungs 2 (two) times daily. 03/06/15   Lucious Groves, DO  carvedilol (COREG) 6.25 MG tablet take 1 tablet by mouth twice a day with meals 03/13/15   Jones Bales, MD  citalopram (CELEXA) 40 MG tablet Take 1 tablet (40 mg total) by mouth daily. 06/13/15   Jones Bales, MD  codeine 30 MG tablet Take 1 tablet (30 mg total) by mouth every 6 (six) hours as needed (Cough). 06/23/15   [name], MD  glipiZIDE (GLUCOTROL) 5 MG tablet Take 1 tablet (5 mg total) by mouth daily before breakfast. TAKE ONLY IF BLOOD SUGAR IS HIGHER THAN 200. MONITOR BLOOD SUGAR AT LEAST 2 TIMES DAILY WHILE TAKING PREDNISONE. 06/25/15 06/24/16  Bartholomew Crews, MD  glucose blood (ACCU-CHEK SMARTVIEW) test strip Use to check blood sugar 1 to 2 times daily. diag code E 11.9. Non- insulin dependent 10/02/14   Sid Falcon, MD  guaiFENesin (ROBITUSSIN) 100 MG/5ML SOLN Take 10 mLs (200 mg total) by mouth every 4 (four) hours as needed for cough or to loosen phlegm. 06/23/15   [name], MD  loratadine (CLARITIN) 10 MG tablet Take 1 tablet (10 mg total) by mouth daily. 06/23/15   [name], MD  losartan-hydrochlorothiazide (HYZAAR) 100-25 MG per tablet Take 1 tablet by mouth daily. 04/08/15   Jessee Avers, MD  metFORMIN (GLUCOPHAGE) 1000 MG tablet Take 1 tablet (1,000 mg total) by mouth 2 (two) times daily with a meal. 01/03/15   Bartholomew Crews, MD  nicotine (NICODERM CQ - DOSED IN MG/24 HOURS) 14 mg/24hr patch Place 1 patch (14 mg total) onto the skin daily. 06/23/15   [name], MD  potassium iodide (SSKI) 1 GM/ML solution Take 0.1 mLs (100 mg total) by mouth 3 (three) times daily. To help break up the mucus in your lungs 06/23/15   [name], MD  pravastatin (PRAVACHOL) 40 MG tablet TAKE 1 TABLET BY MOUTH DAILY Patient taking differently:  TAKE 1 TABLET BY MOUTH DAILY AT BEDTIME 04/08/15   Jones Bales, MD  sodium chloride (OCEAN) 0.65 % SOLN nasal spray Place 2 sprays into both nostrils 3 (three) times daily. Patient taking differently: Place 1 spray into both nostrils 3 (three) times daily.  04/14/15   Jones Bales, MD  tiotropium (SPIRIVA) 18 MCG inhalation capsule Inhale 2 puffs daily for COPD maintenance Patient taking differently: Place 18 mcg into inhaler and inhale daily.  06/13/15   Jones Bales, MD   BP 165/92 mmHg  Pulse 117  Temp(Src) 98.6 F (37 C) (Oral)  Resp 26  Ht _0  (1.702 m)  Wt 220 lb (99.791 kg)  BMI 34.45 kg/m2  SpO2 100% Physical Exam  Constitutional: She is oriented to person, place, and time. She appears well-developed and well-nourished. She appears distressed.  HENT:  Head: Normocephalic and atraumatic.  Nose: Nose normal.  Mouth/Throat: Oropharynx is clear and moist.  Eyes: Conjunctivae and EOM are normal. Pupils are equal, round, and reactive to light.  Neck: Normal range of motion. Neck supple. No JVD present. No tracheal deviation present. No thyromegaly present.  Cardiovascular: Normal rate, regular rhythm, normal heart sounds and intact distal pulses.  Exam reveals no gallop and no friction rub.   No murmur heard. Pulmonary/Chest: No stridor. She is in respiratory distress. She has wheezes. She has no rales. She exhibits no tenderness.  Tachypnea, increased wob  Abdominal: Soft. Bowel sounds are normal. She exhibits no distension and no mass. There is no tenderness. There is no rebound and no guarding.  Musculoskeletal: Normal range of motion. She exhibits no edema or tenderness.  Lymphadenopathy:    She has no cervical adenopathy.  Neurological: She is alert and oriented to person, place, and time. She displays normal reflexes. She exhibits normal muscle tone. Coordination normal.  Skin: Skin is warm and dry. No rash noted. No erythema. No pallor.  Psychiatric: She has a  normal mood and affect. Her behavior is normal. Judgment and thought content normal.  Nursing note and vitals reviewed.   ED Course  Procedures (including critical care time) DIAGNOSTIC STUDIES: Oxygen Saturation is 98% on RA, normal by my interpretation.    COORDINATION OF CARE: 2:02 AM Discussed treatment plan with pt at bedside and pt agreed to plan.   Labs Review Labs Reviewed  BASIC METABOLIC PANEL  CBC    Imaging Review No results found. I have personally reviewed and evaluated these images and lab results as part of my medical decision-making.   EKG Interpretation   Date/Time:  Thursday July 03 2015 01:35:04 EDT Ventricular Rate:  108 PR Interval:  136 QRS Duration: 82 QT Interval:  352 QTC Calculation: 471 R Axis:   90 Text Interpretation:  Sinus tachycardia Biatrial enlargement Rightward  axis Pulmonary disease pattern Septal infarct , age undetermined Abnormal  ECG Confirmed by Folashade Gamboa  MD, Zuleyka Kloc ([id number]) on 07/03/2015 1:52:47 AM     CRITICAL CARE Performed by: Kalman Drape Total critical care time: 30 min Critical care time was exclusive of separately billable procedures and treating other patients. Critical care was necessary to treat or prevent imminent or life-threatening deterioration. Critical care was time spent personally by me on the following activities: development of treatment plan with patient and/or surrogate as well as nursing, discussions with consultants, evaluation of patient's response to treatment, examination of patient, obtaining history from patient or surrogate, ordering and performing treatments and interventions, ordering and review of laboratory studies, ordering and review of radiographic studies, pulse oximetry and re-evaluation of patient's condition.  MDM   Final diagnoses:  Chronic obstructive pulmonary disease with acute exacerbation   I personally performed the services described in this documentation, which was scribed in my  presence. The recorded information has been reviewed and is accurate.  64 yo female with frequent COPD exacerbations with worsening SOB over the last 24 hours.  No improvement with home meds.  Pt still wheezing significantly after neb  here, plan for CAT and bipap   Linton Flemings, MD 07/03/15 [phone]

## 2015-07-03 NOTE — ED Notes (Signed)
0530 respiratory contacted for transport

## 2015-07-03 NOTE — Progress Notes (Addendum)
Patient ID: [name], female   DOB: [birth year], 64 y.o.   MRN: [id number]   Subjective: I had a long discussion with [name] this morning about what happened since I last saw her a week ago and what we can do for her going forward.  Since she left the hospital last week, she took all of her prednisone, all of her Augmentin pills except one, and only smoked one and a half cigarettes. She was feeling pretty good until her husband's mother died on 02/17/2023. Her anxiety was through the roof, but she packed up her nebulizers and made it to the funeral. After that, everything went downhill, and she finally came into the ED yesterday where she received BiPap, levofloxacin, and a shot of methylprednisone. Her chest x-ray looked clear and when I saw her, I removed her BipPap and she was satting 98% on room air, talking just fine.  We talked about the options moving forward. She said she lies in bed until the afternoon and has to drag herself out to socialize, she often feels worthless, and has serious problems falling asleep, even before she was taking the prednisone. She's been on the citalopram for years; we increased her dose to 40mg  a few weeks ago but she still feels awful. I talked about adding Wellbutrin which can help her smoking urges and she was all for it.  She also said her husband jokingly took a video of her snoring a few months back, she has morning headaches, and has been exhausted during the day. We talked about getting a sleep study and she was very interested in the idea.  Lastly, she's been having some dyspepsia when she lies down. She was going to call Dr. Gordy Levan for something for her reflux but then her mother-in-law passed away so she never got the chance.  Objective: Vital signs in last 24 hours: Filed Vitals:   07/03/15 0601 07/03/15 5176 07/03/15 0803 07/03/15 0807  BP: 145/93  176/74   Pulse: 104 100 111   Temp: 97.3 F (36.3 C)  98.2 F (36.8 C)   TempSrc:  Axillary  Oral   Resp: 22 17 27    Height:      Weight:      SpO2: 100% 100% 95% 97%   Weight change:  No intake or output data in the 24 hours ending 07/03/15 0839  General: resting in bed on BiPap when I walked in HEENT: no scleral icterus. No goiter or lymphadenopathy Cardiac: RRR, no rubs, murmurs or gallops Pulm: diffuse wheezing, no basilar crackles Abd: soft, nontender, nondistended, BS present Ext: warm and well perfused, no pedal edema  Lab Results: Basic Metabolic Panel:  Recent Labs Lab 07/03/15 0144  NA 140  K 3.9  CL 101  CO2 28  GLUCOSE 147*  BUN 6  CREATININE 0.93  CALCIUM 9.3   CBC:  Recent Labs Lab 07/03/15 0144  WBC 8.9  HGB 12.0  HCT 35.9*  MCV 86.7  PLT 302   CBG:  Recent Labs Lab 07/03/15 0626 07/03/15 0802  GLUCAP 266* 258*   Studies/Results: Dg Chest Port 1 View  07/03/2015   CLINICAL DATA:  Initial evaluation for acute shortness of breath. History of COPD.  EXAM: PORTABLE CHEST - 1 VIEW  COMPARISON:  Prior radiograph from 06/09/2015  FINDINGS: There is accentuation of the cardiac silhouette, likely related AP technique.  Lungs are normally inflated. Attenuation the pulmonary markings likely related underlying COPD. No focal infiltrates identified. No overt pulmonary  edema. No definite pleural effusion on this single frontal projection. No pneumothorax.  No acute osseus abnormality. ACDF overlies the lower cervical spine. Surgical clips overlie the right axilla.  IMPRESSION: 1. No active cardiopulmonary disease. 2. COPD.   Electronically Signed   By: Jeannine Boga M.D.   On: 07/03/2015 04:21   Medications: I have reviewed the patient's current medications. Scheduled Meds: . antiseptic oral rinse  7 mL Mouth Rinse q12n4p  . budesonide-formoterol  2 puff Inhalation BID  . chlorhexidine  15 mL Mouth Rinse BID  . citalopram  40 mg Oral Daily  . enoxaparin (LOVENOX) injection  50 mg Subcutaneous Q24H  . losartan  100 mg Oral Daily    And  . hydrochlorothiazide  25 mg Oral Daily  . insulin aspart  0-9 Units Subcutaneous 6 times per day  . ipratropium-albuterol  3 mL Nebulization Q4H  . levofloxacin (LEVAQUIN) IV  750 mg Intravenous Daily  . methylPREDNISolone (SOLU-MEDROL) injection  60 mg Intravenous Q12H  . pravastatin  40 mg Oral QHS  . sodium chloride  3 mL Intravenous Q12H   Continuous Infusions:  PRN Meds:.albuterol, codeine Assessment/Plan:  COPD exacerbation: We will need to re-think our care for [name] to prevent these readmission. Her diagnosis was confirmed last hospitalization with an FEV1/FVC ratio of 57% predicted, so she definitely has physiologic disease, but we also need to address the psychologic component which is playing a hugely negative role in her sympoamatology and life. She has serious anxiety and depression so I'm thinking we should tailor her psychotropic regiment a bit, specifically stopping her citalopram, starting her on buproprion to concomitantly help her smoking urge, starting buspirone for her generalized anxiety, and low dose lorazepam until the latter two medications take effect in about a month. We will start buproprion at 150mg  for a week and then titrate up to 300mg  next week; this should be taken in the morning due its potential activating effect. I'll also start buspirone 7.5mg  twice daily and titrate up to 10mg  twice daily after 1 week. To help control her anxiety while her bupropion and buspirone is taking effect, I'll give her a low dose lorazepam to try while inpatient to see if this helps, then we can see her weekly in the clinic to re-fill her presciptions and follow her course. I'll also give her a low-dose nicotine patch while she's here and send her home with some patches. Pharmacy gave her the number for [facility] as well. I also think that her prednisone is contributing to her anxiety, but she will also benefit from its physiologic effect, so I'll half her dose to 20mg   daily and continue this while she's here. Regarding her antibiotics, she just completed a dose of augmentin, her chest x-ray looks clear, and she's not producing as much sputum this admission, plus her QT was prolonged, so I'll stop the levofloxacin. We can also change her Symbicort to [facility] which is a more potent steroid, and change her carvedilol to metoprolol so we don't inhibit beta-2. I'll go ahead and start this inpatient given she's pretty tachycardic. I also think she has some element of obstructive sleep apnea which is not helping her COPD nor her depression, and she would benefit from referral to sleep neurologists. She does have some dyspepsia as well, which may be irritating her airway, so I'll give her famotidine 20mg  daily; we checked with pharmacy and this doesn't interact with any of her medications. Finally, her TSH was borderline low last year  so I sent for another one given her anxiety, tachycardia, and some intermittent loose stools. -Discontinue levofloxacin -Discontinue methyprednisone and start her on 20mg  prednisone daily -Taper citalopram to 20mg  daily for one week, then 20mg  every other day for one week, then no more -Start buproprion 150mg  for one week, then titrate up to 300mg  -Start buspirone 7.5mg  twice daily for one week, then 10mg  twice daily for a week; this can be titrated up 2.5mg  weekly as needed after that -Start lorazepam 0.5mg  four times daily -Start Dulera 200/5 MDI 2 puffs BID -Start Spiriva 1cap daily -Her home COPD meds should be: Dulera 200/5 MDI 2 puffs BID, Spiria 1cap daily, and Albuterol and Duoneb at home for exacerbations -Change carvedilol 6.25mg  to metoprolol XL 12.5mg  daily -Start famotidine 40mg  daily -Start codeine for her rib pain -Start potassium iodide to help clear her secretions -Start nicotine patch 7mg  daily -She will need weekly outpatient follow-up -Sleep study as outpatient  Hyperlipidemia: We will increase her to high-intensity  statin of atorvastatin 80mg  given her risk. She should also be on aspirin 81mg  which I started. -Change pravastatin 40mg  to atorvastatin 80mg  -Start aspirin 81mg   Dispo: Disposition is deferred at this time, awaiting improvement of current medical problems.  The patient does have a current PCP (Jones Bales, MD) and does need an [facility] hospital follow-up appointment after discharge.  The patient does not have transportation limitations that hinder transportation to clinic appointments.  .Services Needed at time of discharge: Y = Yes, Blank = No PT:   OT:   RN:   Equipment:   Other:     LOS: 0 days   [name], MD 07/03/2015, 8:39 AM

## 2015-07-04 ENCOUNTER — Encounter: Payer: Self-pay | Admitting: Student-PharmD

## 2015-07-04 LAB — GLUCOSE, CAPILLARY
GLUCOSE-CAPILLARY: 298 mg/dL — AB (ref 65–99)
GLUCOSE-CAPILLARY: 345 mg/dL — AB (ref 65–99)
GLUCOSE-CAPILLARY: 293 mg/dL — AB (ref 65–99)
GLUCOSE-CAPILLARY: 362 mg/dL — AB (ref 65–99)
Glucose-Capillary: 188 mg/dL — ABNORMAL HIGH (ref 65–99)

## 2015-07-04 MED ORDER — INFLUENZA VAC SPLIT QUAD 0.5 ML IM SUSY
0.5000 mL | PREFILLED_SYRINGE | INTRAMUSCULAR | Status: AC
  Administered 2015-07-05: 0.5 mL via INTRAMUSCULAR
  Filled 2015-07-04: qty 0.5

## 2015-07-04 MED ORDER — LORAZEPAM 0.5 MG PO TABS
0.5000 mg | ORAL_TABLET | Freq: Every day | ORAL | Status: DC
  Administered 2015-07-04 – 2015-07-05 (×2): 0.5 mg via ORAL
  Filled 2015-07-04: qty 1

## 2015-07-04 MED ORDER — DEXTROMETHORPHAN POLISTIREX ER 30 MG/5ML PO SUER
15.0000 mg | Freq: Two times a day (BID) | ORAL | Status: DC | PRN
Start: 2015-07-04 — End: 2015-07-05
  Administered 2015-07-04: 15 mg via ORAL
  Filled 2015-07-04 (×2): qty 5

## 2015-07-04 MED ORDER — CODEINE SULFATE 15 MG PO TABS
30.0000 mg | ORAL_TABLET | Freq: Four times a day (QID) | ORAL | Status: DC | PRN
  Administered 2015-07-06 – 2015-07-08 (×4): 30 mg via ORAL
  Filled 2015-07-04 (×4): qty 2

## 2015-07-04 MED ORDER — GUAIFENESIN ER 600 MG PO TB12
1200.0000 mg | ORAL_TABLET | Freq: Two times a day (BID) | ORAL | Status: DC
  Administered 2015-07-04 – 2015-07-08 (×9): 1200 mg via ORAL
  Filled 2015-07-04 (×9): qty 2

## 2015-07-04 MED ORDER — GUAIFENESIN 100 MG/5ML PO SOLN
10.0000 mL | ORAL | Status: DC | PRN
  Administered 2015-07-06: 200 mg via ORAL
  Filled 2015-07-04 (×3): qty 5

## 2015-07-04 MED ORDER — ZOLPIDEM TARTRATE 5 MG PO TABS
5.0000 mg | ORAL_TABLET | Freq: Once | ORAL | Status: AC
  Administered 2015-07-04: 5 mg via ORAL
  Filled 2015-07-04: qty 1

## 2015-07-04 MED ORDER — INSULIN ASPART 100 UNIT/ML ~~LOC~~ SOLN: 0.0000 [IU] | Freq: Three times a day (TID) | SUBCUTANEOUS | Status: DC

## 2015-07-04 MED ORDER — LORATADINE 10 MG PO TABS
10.0000 mg | ORAL_TABLET | Freq: Every day | ORAL | Status: DC
Start: 2015-07-04 — End: 2015-07-08
  Administered 2015-07-04 – 2015-07-08 (×5): 10 mg via ORAL
  Filled 2015-07-04 (×5): qty 1

## 2015-07-04 MED ORDER — POTASSIUM IODIDE 1 GM/ML PO SOLN: 100.0000 mg | Freq: Three times a day (TID) | ORAL | Status: DC

## 2015-07-04 MED ORDER — INSULIN ASPART 100 UNIT/ML ~~LOC~~ SOLN
0.0000 [IU] | Freq: Every day | SUBCUTANEOUS | Status: DC
  Administered 2015-07-04: 5 [IU] via SUBCUTANEOUS

## 2015-07-04 NOTE — Progress Notes (Signed)
  Date: 07/04/2015  Patient name: [name]  Medical record number: [id number]  Date of birth: 1951-04-07   This patient has been seen and the plan of care was discussed with the house staff. Please see their note for complete details. I concur with their findings with the following additions/corrections: [name] looks great today. She was able to have full conversations without the slight breathlessness that she did yesterday. Today, she is c/o her "sinuses": + PND, HA, and sinus pressure but no sneezing, rhinorrhea. She is on claritin at home but stopped the nasal steroid spray bc it clogged up her nose and she was unable to breath through either her mouth or her nose. She has never had a referral to ENT and never had a CT sinuses. Her pul auscultation was limited 2/2 coughing but appeared to have less wheezing today than yesterday.   I would not change her treatment plan which was carefully crafted but would consider outpt referral for "sinuses" and VCD if our tx plan does not decrease her admissions.   She will be sch for weekly [facility] appts in Sep with Dr E to help monitor sxs, intervene sooner to try to prevent and admission, ensure med compliance, and taper benzo.   Bartholomew Crews, MD 07/04/2015, 11:28 AM

## 2015-07-04 NOTE — Progress Notes (Signed)
Pt transferred to [place] bed 15 per w/c accompanied by Nurse tech with all belongings

## 2015-07-04 NOTE — Progress Notes (Signed)
Utilization Review Completed.  

## 2015-07-04 NOTE — Progress Notes (Signed)
Admission note: Late Entry  Arrival Method: Transfer from [facility].  Mental Status: A&OX4. Telemetry: N/A.  Skin: Intact. Patient has 3 scabs on her right lateral thigh that she believes are bug bites.  Tubes: N/A. IV: LFA NSL. Pain: Denies.  Family: No one at bedside. Living Situation: From home. Safety Measures: Call bell within reach.  6E Orientation: Oriented to unit and surroundings. Instructed to use call bell for assistance.  Joellen Jersey, RN.

## 2015-07-04 NOTE — Progress Notes (Signed)
Patient ID: [name], female   DOB: 1951/03/29, 64 y.o.   MRN: [id number] Medication Samples have been provided to the patient.  Drug: Proventil Strength: 90 mcg Qty: 1 LOT: [id number] Exp.Date: 12/2016  The patient has been instructed regarding the correct time, dose, and frequency of taking this medication, including desired effects and most common side effects.   Samples signed for by Dwaine Deter 8:55 AM 07/04/2015

## 2015-07-04 NOTE — Progress Notes (Signed)
Patient ID: [name], female   DOB: [birth year], 64 y.o.   MRN: [id number]   Subjective: [name] was in high spirits this morning when I saw her. She still feels a rattle in her chest that's causing her to cough and she's having a hard time getting it out. I told her can try some Mucinex and walking around the hall today and she was good with that game plan. When I asked her how she felt after taking the Ativan, she said she couldn't tell much of a difference because she was taking so many other pills. I told her we'll keep her at the same dose and asked her to be mindful of how she's feeling after her next dose. She was profoundly grateful for all of the changes we've made and how hard Dr. Maudie Mercury and Dr. Georgina Snell have been working to help get her medications straightened out. We talked about cheesecake for a while, I told her we'd be sending her up to floor today, and I'd check on her tomorrow.  Objective: Vital signs in last 24 hours: Filed Vitals:   07/04/15 0113 07/04/15 0251 07/04/15 0400 07/04/15 0826  BP:   119/59   Pulse:   88   Temp:   98.2 F (36.8 C)   TempSrc:   Oral   Resp:   25   Height:      Weight:  100.064 kg (220 lb 9.6 oz)    SpO2: 96%  96% 96%   General: resting in bed eating breakfast HEENT: no scleral icterus Cardiac: slightly tachycardic, no rubs, murmurs or gallops Pulm: she was able to hold a full conversation with me and had less wheezes than yesterday Abd: soft, nontender, nondistended, BS present Ext: warm and well perfused, no pedal edema  Medications: I have reviewed the patient's current medications. Scheduled Meds: . antiseptic oral rinse  7 mL Mouth Rinse q12n4p  . aspirin EC  81 mg Oral Daily  . atorvastatin  80 mg Oral q1800  . buPROPion  150 mg Oral Daily  . busPIRone  7.5 mg Oral BID  . chlorhexidine  15 mL Mouth Rinse BID  . citalopram  20 mg Oral Daily  . enoxaparin (LOVENOX) injection  50 mg Subcutaneous Q24H  . famotidine  20 mg  Oral Daily  . guaiFENesin  1,200 mg Oral BID  . losartan  100 mg Oral Daily   And  . hydrochlorothiazide  25 mg Oral Daily  . insulin aspart  0-9 Units Subcutaneous 6 times per day  . ipratropium-albuterol  3 mL Nebulization Q6H  . metoprolol succinate  12.5 mg Oral Daily  . mometasone-formoterol  2 puff Inhalation BID  . nicotine  7 mg Transdermal Daily  . predniSONE  20 mg Oral Q breakfast  . sodium chloride  3 mL Intravenous Q12H   Continuous Infusions:  PRN Meds:.albuterol, codeine, LORazepam   Assessment/Plan:  COPD Exacerbation: Her dyspnea is improving and she's saturating 100% on room air, but she still feels a rattle in her chest and is producing some sputum. However I still don't think she needs antibiotics because she's completely so many courses recently and her chest-xray was clear, so I'll try some guafenesin to help break it up and check on her tomorrow. We made numerous changes to her psychotropic medications yesterday which she's tolerating well this far. Hopefully she'll be able to give me a better idea of how the Ativan is making her feel tomorrow. I'll touch base with [facility]  to ensure they can see her as an outpatient to help keep her medication regiment straight. She doesn't need step-down so I'll transfer her to the floor. -Added guafenesin 1200mg  BID -Otherwise, continue current game plan per last note -Transfer to Med-Surg  Dispo: Disposition is deferred at this time, awaiting improvement of current medical problems.   The patient does have a current PCP (Christie Bales, MD) and does need an [facility] hospital follow-up appointment after discharge.  The patient does have transportation limitations that hinder transportation to clinic appointments.  .Services Needed at time of discharge: Y = Yes, Blank = No PT:   OT:   RN:   Equipment:   Other:     LOS: 1 day   [name], MD 07/04/2015, 9:31 AM

## 2015-07-04 NOTE — Progress Notes (Signed)
Called report to Sybil RN on [address] Pt will transfer per w/c to [address] bed 15 when ready.

## 2015-07-04 NOTE — Discharge Summary (Signed)
Name: [name] MRN: [id number] DOB: [birth year] 64 y.o. PCP: Marrian Salvage, MD  Date of Admission: 07/03/2015  1:37 AM Date of Discharge: 07/08/2015 Attending Physician: Burns Spain, MD  Discharge Diagnosis: 1. COPD exacerbation 2. Anxiety and depression 3. Tobacco abuse  Discharge Medications:   Medication List    STOP taking these medications        budesonide-formoterol 160-4.5 MCG/ACT inhaler  Commonly known as:  SYMBICORT     carvedilol 6.25 MG tablet  Commonly known as:  COREG     pravastatin 40 MG tablet  Commonly known as:  PRAVACHOL     sodium chloride 0.65 % Soln nasal spray  Commonly known as:  OCEAN     tiotropium 18 MCG inhalation capsule  Commonly known as:  SPIRIVA      TAKE these medications        ACCU-CHEK FASTCLIX LANCETS Misc  1 each by Other route See admin instructions. Check blood sugar daily as needed for high blood sugar.     ACCU-CHEK NANO SMARTVIEW W/DEVICE Kit  1 each by Other route See admin instructions. Check blood sugar daily as needed for high blood sugar.     acetaminophen 325 MG tablet  Commonly known as:  TYLENOL  Take 650 mg by mouth 2 (two) times daily as needed for headache.     albuterol 108 (90 BASE) MCG/ACT inhaler  Commonly known as:  VENTOLIN HFA  Inhale 2 puffs into the lungs every 6 (six) hours as needed for wheezing or shortness of breath.     aspirin 81 MG EC tablet  Take 1 tablet (81 mg total) by mouth daily.     atorvastatin 80 MG tablet  Commonly known as:  LIPITOR  Take 1 tablet (80 mg total) by mouth daily at 6 PM. For your high cholesterol     buPROPion 300 MG 24 hr tablet  Commonly known as:  WELLBUTRIN XL  Take 1 tablet (300 mg total) by mouth daily. To help your smoking urge, depression, and anxiety  Start taking on:  07/09/2015     citalopram 40 MG tablet  Commonly known as:  CELEXA  Take 0.5 tablets (20 mg total) by mouth every other day. Until September 5th, then stop  taking     famotidine 20 MG tablet  Commonly known as:  PEPCID  Take 1 tablet (20 mg total) by mouth daily. For reflux, which can make your cough worse     fluticasone-salmeterol 230-21 MCG/ACT inhaler  Commonly known as:  ADVAIR HFA  Inhale 2 puffs into the lungs 2 (two) times daily.     glipiZIDE 5 MG tablet  Commonly known as:  GLUCOTROL  Take 1 tablet (5 mg total) by mouth daily before breakfast. TAKE ONLY IF BLOOD SUGAR IS HIGHER THAN 200. MONITOR BLOOD SUGAR AT LEAST 2 TIMES DAILY WHILE TAKING PREDNISONE.     glucose blood test strip  Commonly known as:  ACCU-CHEK SMARTVIEW  Use to check blood sugar 1 to 2 times daily. diag code E 11.9. Non- insulin dependent     guaiFENesin 600 MG 12 hr tablet  Commonly known as:  MUCINEX  Take 2 tablets (1,200 mg total) by mouth 2 (two) times daily.     ipratropium-albuterol 0.5-2.5 (3) MG/3ML Soln  Commonly known as:  DUONEB  Take 3 mLs by nebulization every 2 (two) hours as needed.     loratadine 10 MG tablet  Commonly known as:  CLARITIN  Take 1 tablet (10 mg total) by mouth daily.     LORazepam 0.5 MG tablet  Commonly known as:  ATIVAN  Take 1 tablet (0.5 mg total) by mouth every 8 (eight) hours as needed for anxiety or sleep.     losartan-hydrochlorothiazide 100-25 MG per tablet  Commonly known as:  HYZAAR  Take 1 tablet by mouth daily.     metFORMIN 1000 MG tablet  Commonly known as:  GLUCOPHAGE  Take 1 tablet (1,000 mg total) by mouth 2 (two) times daily with a meal.     metoprolol 50 MG tablet  Commonly known as:  LOPRESSOR  Take 0.5 tablets (25 mg total) by mouth 2 (two) times daily.     nicotine polacrilex 2 MG gum  Commonly known as:  NICORETTE  Take 1 each (2 mg total) by mouth as needed for smoking cessation.        Disposition and follow-up:   [name] was discharged from [facility] in good condition.  At the hospital follow up visit please address:  Her anxiety and  depression Managing her lorazepam on a weekly basis and tapering by the end of September Down-tapering citalopram  Up-tapering her bupropion Progress on smoking cessation Whether or not she went to sleep study and pulmonary rehab  2.  Labs / imaging needed at time of follow-up: None  3.  Pending labs/ test needing follow-up: Sleep study  Discharge Instructions: Discharge Instructions    AMB Referral to [facility] Care Management    Complete by:  As directed   Reason for consult:  Referral from previous admission follow up for COPD exacerbation; new medications/regimen  Diagnoses of:  COPD/ Pneumonia  Expected date of contact:  1-3 days (reserved for hospital discharges)  Patient is assigned community nurse, Maxine Glenn for transition of care calls and assess for home visits.  Please assign to pharmacist for smoking cessations needs and program.  For questions please contact: Charlesetta Shanks, RN BSN CCM [facility] Liaison  [phone] business mobile phone     Call MD for:  difficulty breathing, headache or visual disturbances    Complete by:  As directed      Call MD for:  difficulty breathing, headache or visual disturbances    Complete by:  As directed      Call MD for:  persistant nausea and vomiting    Complete by:  As directed      Call MD for:  persistant nausea and vomiting    Complete by:  As directed      Call MD for:  temperature >100.4    Complete by:  As directed      Call MD for:  temperature >100.4    Complete by:  As directed      Diet - low sodium heart healthy    Complete by:  As directed      Diet - low sodium heart healthy    Complete by:  As directed      Increase activity slowly    Complete by:  As directed      Increase activity slowly    Complete by:  As directed           Consultations:    Procedures Performed:  Dg Chest Port 1 View  07/03/2015   CLINICAL DATA:  Initial evaluation for acute shortness of breath. History of COPD.  EXAM:  PORTABLE CHEST - 1 VIEW  COMPARISON:  Prior radiograph from 06/09/2015  FINDINGS: There is accentuation of the cardiac silhouette, likely related AP technique.  Lungs are normally inflated. Attenuation the pulmonary markings likely related underlying COPD. No focal infiltrates identified. No overt pulmonary edema. No definite pleural effusion on this single frontal projection. No pneumothorax.  No acute osseus abnormality. ACDF overlies the lower cervical spine. Surgical clips overlie the right axilla.  IMPRESSION: 1. No active cardiopulmonary disease. 2. COPD.   Electronically Signed   By: Rise Mu M.D.   On: 07/03/2015 04:21   Admission HPI: [name] is a 64 y.o. AA female with past medical history of COPD (confirmed with PFTs during last admission), type 2 diabetes mellitus, hypertension, hyperlipidemia, history of breast cancer who presented to the emergency department with shortness of breath. During our encounter she was still on BiPap in the emergency department but states that her symptoms began on Sunday and have progressively worsened over the course of the past few days. She reports increased production of sputum associated with cough. Does not endorse any blood in her sputum. Denies any recent fever, chills, nausea, vomiting, or diarrhea. Patient was recently discharged from this hospital on August 15 when she had been admitted for COPD exacerbation. Following this discharge she reports having finished the steroid and antibiotic course she had been sent home on. Prior to arrival in the ED, patient tried to relieve her symptoms with home nebulizer treatments with minimal relief of symptoms. While in the ED, she had a 1-view chest xray that showed no active cardiopulmonary disease. Her white blood cell count is normal, afebrile, saturating O2 at 100% on BiPap.  Hospital Course by problem list:  1. COPD exacerbation: [name] presented for her third COPD  exacerbation this month; she required BiPap in the emergency room but when I saw her the following day she was saturating 100% on room air, holding a conversation with very little dyspnea. Her mother passed away the day prior to admission; this triggered her anxiety which subsequently triggered her COPD exacerbation. Her diagnosis of COPD was confirmed last hospitalization with an FEV1/FVC ratio of 57% predicted, so she definitely has physiologic disease, but we also felt we had to address the psychologic component (per below). Physiologically, we decided to treat her with 40mg  prednisone for 5 days, we changed her Symbicort to [facility] given it's higher potency, we started famotidine 20mg  for GERD, and we changed her carvedilol to metoprolol to avoid b2-inhibition. We decided to hold antibiotics because she had just completed a course of Augmentin two days prior to coming into the hospital, she had 1 out of 3 Gold's criteria, and her chest x-ray looked clear. We also felt she has underlying obstructive sleep apnea so we referred to her a sleep neurologist, as well as an Financial planner for referral to evaluate her for concomitant vocal cord dysfunction. She has a standing referral to pulmonary rehabilitation which we recommended she utilize once she is discharged to help her gain control of her dyspnea when it occurs. Lastly, we also addressed tobacco cessation per below.  2. Anxiety and depression: She has severe anxiety and depression; after a lengthy discussion, she revealed she sleeps most of the day, has little interest in spending time with friends (an activity she used to enjoy), and she has very low energy. She sometimes feels like isn't worth living but never wanted to commit suicide nor had a plan. She also fears her COPD exacerbations and even the thought of it makes her anxious. She had been  on citalopram for 2 years, but this hadn't helped, so we decided to taper her citalopram with a plan of 20mg  daily  until 8/1, then 20mg  every other day until 9/8, then no more. In lieu of citalopram, we started her on buproprion 300mg  to concomitantly help her smoking urge. We started her on buspirone 7.5mg  while inpatient but discontinued this medication after decided it would be best to decrease her medication burden and her anxiety would be treated with bupropion. After some consideration, we decided to start lorazepam 0.5mg  as needed for anxiety with weekly follow-up appointments for re-fills; we will need to titrate this medication down over the next month while waiting for the bupropion to take effect over the next month. [facility] will also be following her monthly to ensure she understands her complicated medication regiment.  3. Tobacco use: She continues to smoke less and less; she only smoked 1.5 cigarettes in the last two weeks. We started bupropion, sent her home with a 7mg  nicotine patch, and gave her the phone number to [facility]. She's currently in a competition with her husband to quit smoking.  Discharge Vitals:   BP 138/62 mmHg  Pulse 82  Temp(Src) 98 F (36.7 C) (Oral)  Resp 18  Ht 5\' 7"  (1.702 m)  Wt 105.481 kg (232 lb 8.7 oz)  BMI 36.41 kg/m2  SpO2 97%  Discharge Labs:  Results for orders placed or performed during the hospital encounter of 07/03/15 (from the past 24 hour(s))  Glucose, capillary     Status: Abnormal   Collection Time: 07/07/15  4:23 PM  Result Value Ref Range   Glucose-Capillary 296 (H) 65 - 99 mg/dL  Glucose, capillary     Status: Abnormal   Collection Time: 07/07/15  9:18 PM  Result Value Ref Range   Glucose-Capillary 144 (H) 65 - 99 mg/dL  Glucose, capillary     Status: Abnormal   Collection Time: 07/08/15  7:53 AM  Result Value Ref Range   Glucose-Capillary 128 (H) 65 - 99 mg/dL  Glucose, capillary     Status: Abnormal   Collection Time: 07/08/15 11:25 AM  Result Value Ref Range   Glucose-Capillary 182 (H) 65 - 99 mg/dL    Signed: Selina Cooley,  MD 07/08/2015, 1:29 PM    Services Ordered on Discharge: [facility]

## 2015-07-04 NOTE — Progress Notes (Signed)
Received report from [place] on 2C.  Joellen Jersey, RN.

## 2015-07-05 DIAGNOSIS — J441 Chronic obstructive pulmonary disease with (acute) exacerbation: Secondary | ICD-10-CM | POA: Diagnosis not present

## 2015-07-05 LAB — GLUCOSE, CAPILLARY
GLUCOSE-CAPILLARY: 134 mg/dL — AB (ref 65–99)
GLUCOSE-CAPILLARY: 185 mg/dL — AB (ref 65–99)
Glucose-Capillary: 234 mg/dL — ABNORMAL HIGH (ref 65–99)
Glucose-Capillary: 345 mg/dL — ABNORMAL HIGH (ref 65–99)

## 2015-07-05 MED ORDER — LORAZEPAM 1 MG PO TABS
1.0000 mg | ORAL_TABLET | Freq: Two times a day (BID) | ORAL | Status: DC
  Administered 2015-07-05 – 2015-07-06 (×3): 1 mg via ORAL
  Filled 2015-07-05 (×3): qty 1

## 2015-07-05 MED ORDER — PREDNISONE 20 MG PO TABS
40.0000 mg | ORAL_TABLET | Freq: Every day | ORAL | Status: DC
  Administered 2015-07-06 – 2015-07-07 (×2): 40 mg via ORAL
  Filled 2015-07-05 (×2): qty 2

## 2015-07-05 MED ORDER — IPRATROPIUM-ALBUTEROL 0.5-2.5 (3) MG/3ML IN SOLN
3.0000 mL | RESPIRATORY_TRACT | Status: DC | PRN
  Administered 2015-07-05 – 2015-07-06 (×3): 3 mL via RESPIRATORY_TRACT
  Filled 2015-07-05 (×4): qty 3

## 2015-07-05 MED ORDER — INSULIN ASPART 100 UNIT/ML ~~LOC~~ SOLN
0.0000 [IU] | Freq: Three times a day (TID) | SUBCUTANEOUS | Status: DC
  Administered 2015-07-05: 7 [IU] via SUBCUTANEOUS
  Administered 2015-07-05: 15 [IU] via SUBCUTANEOUS
  Administered 2015-07-05 – 2015-07-06 (×2): 3 [IU] via SUBCUTANEOUS
  Administered 2015-07-06: 20 [IU] via SUBCUTANEOUS
  Administered 2015-07-06: 15 [IU] via SUBCUTANEOUS
  Administered 2015-07-07: 7 [IU] via SUBCUTANEOUS
  Administered 2015-07-07: 11 [IU] via SUBCUTANEOUS
  Administered 2015-07-07: 3 [IU] via SUBCUTANEOUS
  Administered 2015-07-08: 4 [IU] via SUBCUTANEOUS
  Administered 2015-07-08: 3 [IU] via SUBCUTANEOUS

## 2015-07-05 MED ORDER — DEXTROMETHORPHAN POLISTIREX ER 30 MG/5ML PO SUER
30.0000 mg | Freq: Two times a day (BID) | ORAL | Status: DC | PRN
  Filled 2015-07-05: qty 5

## 2015-07-05 NOTE — Progress Notes (Signed)
Patient ID: [name], female   DOB: 1951/05/18, 64 y.o.   MRN: [id number]   Subjective: [name] said her coughing was pretty bad last night; he said she feels like she can only use the top half of her lungs and has a hard time breathing air out. She also said she notices her voice changes and sounds more "high pitched" shortly before her coughing spells. When I asked if she had ever used an incentive spirometer, she said she had and she felt it helped her, so I gave her one today. We discussed the game plan for today and she was agreeable to what we came up with.  Objective: Vital signs in last 24 hours: Filed Vitals:   07/04/15 2045 07/04/15 2125 07/05/15 0226 07/05/15 0446  BP:  159/72  138/74  Pulse:  74  71  Temp:  98 F (36.7 C)  98.5 F (36.9 C)  TempSrc:  Oral    Resp:  18  18  Height:      Weight:  103.1 kg (227 lb 4.7 oz)    SpO2: 98% 100% 97% 97%   General: resting in bed eating breakfast with audible wheezing and distressful coughs, satting 100% on room air Cardiac: RRR, no rubs, murmurs or gallops Pulm: diffuse expiratory wheezes throughout, worse than yesterday but she hadn't gotten her breathing treatments yet this morning Abd: soft, nontender, nondistended, BS present Ext: warm and well perfused, no pedal edema  Lab Results:CBG:  Recent Labs Lab 07/03/15 2352 07/04/15 0458 07/04/15 0951 07/04/15 1303 07/04/15 1706 07/04/15 2124  GLUCAP 388* 188* 298* 345* 293* 362*   Medications: I have reviewed the patient's current medications. Scheduled Meds: . antiseptic oral rinse  7 mL Mouth Rinse q12n4p  . aspirin EC  81 mg Oral Daily  . atorvastatin  80 mg Oral q1800  . buPROPion  150 mg Oral Daily  . busPIRone  7.5 mg Oral BID  . chlorhexidine  15 mL Mouth Rinse BID  . citalopram  20 mg Oral Daily  . enoxaparin (LOVENOX) injection  50 mg Subcutaneous Q24H  . famotidine  20 mg Oral Daily  . guaiFENesin  1,200 mg Oral BID  . losartan  100 mg Oral  Daily   And  . hydrochlorothiazide  25 mg Oral Daily  . Influenza vac split quadrivalent PF  0.5 mL Intramuscular Tomorrow-1000  . insulin aspart  0-20 Units Subcutaneous TID WC  . insulin aspart  0-5 Units Subcutaneous QHS  . ipratropium-albuterol  3 mL Nebulization Q6H  . loratadine  10 mg Oral Daily  . LORazepam  0.5 mg Oral QHS  . metoprolol succinate  12.5 mg Oral Daily  . mometasone-formoterol  2 puff Inhalation BID  . nicotine  7 mg Transdermal Daily  . predniSONE  20 mg Oral Q breakfast  . sodium chloride  3 mL Intravenous Q12H   Continuous Infusions:  PRN Meds:.albuterol, codeine, dextromethorphan, guaiFENesin, LORazepam   Assessment/Plan:   COPD Exacerbation: She had worse wheezing on exam this morning and I noticed some dysphonia when she spoke. I think she has some element of vocal cord dysfunction so I explained this to her and recommended she see an ENT doctor to evaluate her. She was asking for breathing treatments more often so I scheduled her for Duonebs every 4 hours as opposed to every 6. I also went up on her dextromethorphan dose to help her cough because it's causing her significant rib pain which is from a muscle  strain. I suspect she has some atelectasis so I gave her an incentive spirometer. Because the prednisone doesn't seem to worsen her anxiety, and her lungs sounded so tight on exam, I upped her dose from 20mg  to the recommended 40mg  which we'll continue for 5 days. -Continue current game plan per last note  Additionally: -Started guafenesin 1200mg  BID -Increased prednisone from 20 to 40mg  daily -Increased dextromethorphan from 15 to 30mg  BID -Duonebs every 4 hours instead of every 6 -Started incentive spirometer -I'll refer her to ENT as outpatient to evaluate for concomitant vocal cord dysfunction atop her COPD -She's scheduled for weekly appointments for the next month to taper her lorazepam and monitor her dyspnea and anxiety  Dispo: Disposition is  deferred at this time, awaiting improvement of current medical problems. Hopefully tomorrow if she's feeling better  The patient does have a current PCP (Christie Salvage, MD) and does need an [facility] hospital follow-up appointment after discharge.  The patient does have transportation limitations that hinder transportation to clinic appointments.  .Services Needed at time of discharge: Y = Yes, Blank = No PT:   OT:   RN:   Equipment:   Other:     LOS: 2 days   Selina Cooley, MD 07/05/2015, 7:40 AM

## 2015-07-06 LAB — GLUCOSE, CAPILLARY
Glucose-Capillary: 143 mg/dL — ABNORMAL HIGH (ref 65–99)
Glucose-Capillary: 334 mg/dL — ABNORMAL HIGH (ref 65–99)
Glucose-Capillary: 354 mg/dL — ABNORMAL HIGH (ref 65–99)
Glucose-Capillary: 166 mg/dL — ABNORMAL HIGH (ref 65–99)

## 2015-07-06 MED ORDER — IPRATROPIUM-ALBUTEROL 0.5-2.5 (3) MG/3ML IN SOLN
3.0000 mL | RESPIRATORY_TRACT | Status: DC | PRN
  Administered 2015-07-06 – 2015-07-08 (×5): 3 mL via RESPIRATORY_TRACT
  Filled 2015-07-06 (×5): qty 3

## 2015-07-06 MED ORDER — LORAZEPAM 0.5 MG PO TABS
0.5000 mg | ORAL_TABLET | ORAL | Status: DC | PRN
  Administered 2015-07-06 – 2015-07-08 (×6): 0.5 mg via ORAL
  Filled 2015-07-06 (×6): qty 1

## 2015-07-06 NOTE — Progress Notes (Signed)
SATURATION QUALIFICATIONS: (This note is used to comply with regulatory documentation for home oxygen)  Patient Saturations on Room Air at Rest =  100% Patient Saturations on Room Air while Ambulating =   98%  Patient Saturations on    N/A   Liters of oxygen while Ambulating = N/A  Please briefly explain why patient needs home oxygen:  N/A

## 2015-07-06 NOTE — Progress Notes (Signed)
Patient ID: [name], female   DOB: 24-Apr-1951, 64 y.o.   MRN: [id number]   Subjective: [name] walked down the hall this morning and only became dyspneic when she got back to her room. She said whatever we're doing is working slowly as she's getting her breath back, feeling less anxious, and coughing up the stuff in her chest. She was asking for an incentive spirometer which I ordered yesterday so I told her I'd try to get her one today.  Objective: Vital signs in last 24 hours: Filed Vitals:   07/05/15 1644 07/05/15 2157 07/06/15 0524 07/06/15 0756  BP:  114/58 155/82 133/79  Pulse:  76 77 78  Temp:  98.6 F (37 C) 98.1 F (36.7 C) 98.4 F (36.9 C)  TempSrc:  Oral Oral Oral  Resp:  18 16 18   Height:      Weight:      SpO2: 98% 100% 100% 100%   General: sleeping Cardiac: RRR, no rubs, murmurs or gallops Pulm: wheezing much improved from yesterady Abd: soft, nontender, nondistended, BS present Ext: warm and well perfused, no pedal edema  Lab Results: Basic Metabolic Panel:  Recent Labs Lab 07/03/15 0144  NA 140  K 3.9  CL 101  CO2 28  GLUCOSE 147*  BUN 6  CREATININE 0.93  CALCIUM 9.3   Medications: I have reviewed the patient's current medications. Scheduled Meds: . antiseptic oral rinse  7 mL Mouth Rinse q12n4p  . aspirin EC  81 mg Oral Daily  . atorvastatin  80 mg Oral q1800  . buPROPion  150 mg Oral Daily  . busPIRone  7.5 mg Oral BID  . chlorhexidine  15 mL Mouth Rinse BID  . citalopram  20 mg Oral Daily  . enoxaparin (LOVENOX) injection  50 mg Subcutaneous Q24H  . famotidine  20 mg Oral Daily  . guaiFENesin  1,200 mg Oral BID  . losartan  100 mg Oral Daily   And  . hydrochlorothiazide  25 mg Oral Daily  . insulin aspart  0-20 Units Subcutaneous TID WC  . insulin aspart  0-5 Units Subcutaneous QHS  . loratadine  10 mg Oral Daily  . metoprolol succinate  12.5 mg Oral Daily  . mometasone-formoterol  2 puff Inhalation BID  . nicotine  7 mg  Transdermal Daily  . predniSONE  40 mg Oral Q breakfast  . sodium chloride  3 mL Intravenous Q12H   Continuous Infusions:  PRN Meds:.codeine, dextromethorphan, guaiFENesin, ipratropium-albuterol, LORazepam  Assessment/Plan:  COPD Exacerbation: Her wheezing continues to improve today and she's feeling subjectively better. She thinks she'll be ready to go home tomorrow. -Continue guafenesin 1200mg  BID -Continue prednisone 40mg  daily (stop date 9/2) -Continue duonebs every 4 hours -Started incentive spirometer -Will need outpatient ENT referral for vocal cord dysfunction evaluation -Will need to follow up with pulmonary rehabilitation -She's scheduled for weekly appointments with [facility] to taper her lorazepam -Please refer to note from 8/25 for comprehensive change in her medication regiment since admission  Dispo: Disposition is deferred at this time, awaiting improvement of current medical problems.   The patient does have a current PCP (Christie Bales, MD) and does need an [facility] hospital follow-up appointment after discharge.  The patient does have transportation limitations that hinder transportation to clinic appointments.  .Services Needed at time of discharge: Y = Yes, Blank = No PT:   OT:   RN:   Equipment:   Other:     LOS: 3 days  Christie Chance, MD 07/06/2015, 12:50 PM

## 2015-07-07 ENCOUNTER — Telehealth (HOSPITAL_COMMUNITY): Payer: Self-pay

## 2015-07-07 LAB — GLUCOSE, CAPILLARY
GLUCOSE-CAPILLARY: 207 mg/dL — AB (ref 65–99)
Glucose-Capillary: 139 mg/dL — ABNORMAL HIGH (ref 65–99)
Glucose-Capillary: 296 mg/dL — ABNORMAL HIGH (ref 65–99)
Glucose-Capillary: 144 mg/dL — ABNORMAL HIGH (ref 65–99)

## 2015-07-07 MED ORDER — PREDNISONE 20 MG PO TABS
40.0000 mg | ORAL_TABLET | Freq: Every day | ORAL | Status: DC
  Administered 2015-07-08: 40 mg via ORAL
  Filled 2015-07-07: qty 2

## 2015-07-07 MED ORDER — CITALOPRAM HYDROBROMIDE 20 MG PO TABS: 20.0000 mg | ORAL_TABLET | ORAL | Status: DC

## 2015-07-07 MED ORDER — INSULIN ASPART 100 UNIT/ML ~~LOC~~ SOLN
6.0000 [IU] | Freq: Three times a day (TID) | SUBCUTANEOUS | Status: DC
  Administered 2015-07-07 – 2015-07-08 (×3): 6 [IU] via SUBCUTANEOUS

## 2015-07-07 MED ORDER — NICOTINE POLACRILEX 2 MG MT GUM
2.0000 mg | CHEWING_GUM | OROMUCOSAL | Status: DC | PRN
  Filled 2015-07-07: qty 1

## 2015-07-07 NOTE — Care Management Important Message (Signed)
Important Message  Patient Details  Name: [name] MRN: [id number] Date of Birth: September 15, 1951   Medicare Important Message Given:  Yes-second notification given    Delorse Lek 07/07/2015, 11:17 AM

## 2015-07-07 NOTE — Consult Note (Signed)
   [facility] CM Inpatient Consult   07/07/2015  [name] [birth year] [id number] This writer received a call from Dr. Melburn Hake this morning [phone] requesting a home visit for this patient after the patient is discharged from the hospital. [facility] Management's role and if the patient needs immediate home visits he may wantto consider  home health care but that [facility] Management will continue to follow up with the patient and medication changes with the transition of care program and the nurse can follow up with home visits as goals are set with the patient.  This Probation officer alerted patient's assigned Sports administrator of patient's needs and referral request.  For further questions, please contact: Natividad Brood, RN BSN [facility] Liaison  [phone] business mobile phone

## 2015-07-07 NOTE — Progress Notes (Signed)
Patient ID: [name], female   DOB: [birth year], 64 y.o.   MRN: [id number]  Patient was reviewed with Viann Fish, PharmD candidate. I agree with the assessment and plan of care documented.

## 2015-07-07 NOTE — Progress Notes (Addendum)
Patient ID: [name], female   DOB: 03/24/1951, 64 y.o.   MRN: [id number]    Subjective: [name] was feeling much better today; she was able to walk down the hall and satted 98% yesterday with the nurse. She's quite anxious about going home and would like to leave tomorrow morning. I told her I'll get the nurse to explain her medications because we made quite a bite of changes. I also called [facility] and they said they can drop by her house to help her with her medications.  Objective: Vital signs in last 24 hours: Filed Vitals:   07/06/15 0756 07/06/15 1646 07/06/15 1807 07/07/15 0525  BP: 133/79 106/61  145/71  Pulse: 78 95  86  Temp: 98.4 F (36.9 C) 98.3 F (36.8 C)  98.5 F (36.9 C)  TempSrc: Oral Oral  Oral  Resp: 18 18  18   Height:      Weight:      SpO2: 100% 97% 95% 100%   General: resting in bed HEENT: no scleral icterus Cardiac: RRR, no rubs, murmurs or gallops Pulm: much less wheezing than yesterday Abd: soft, nontender, nondistended, BS present Ext: warm and well perfused, no pedal edema  Medications: I have reviewed the patient's current medications. Scheduled Meds: . antiseptic oral rinse  7 mL Mouth Rinse q12n4p  . aspirin EC  81 mg Oral Daily  . atorvastatin  80 mg Oral q1800  . buPROPion  150 mg Oral Daily  . busPIRone  7.5 mg Oral BID  . chlorhexidine  15 mL Mouth Rinse BID  . citalopram  20 mg Oral Daily  . enoxaparin (LOVENOX) injection  50 mg Subcutaneous Q24H  . famotidine  20 mg Oral Daily  . guaiFENesin  1,200 mg Oral BID  . losartan  100 mg Oral Daily   And  . hydrochlorothiazide  25 mg Oral Daily  . insulin aspart  0-20 Units Subcutaneous TID WC  . insulin aspart  0-5 Units Subcutaneous QHS  . loratadine  10 mg Oral Daily  . metoprolol succinate  12.5 mg Oral Daily  . mometasone-formoterol  2 puff Inhalation BID  . nicotine  7 mg Transdermal Daily  . predniSONE  40 mg Oral Q breakfast  . sodium chloride  3 mL Intravenous Q12H    Continuous Infusions:  PRN Meds:.codeine, dextromethorphan, guaiFENesin, ipratropium-albuterol, LORazepam   Assessment/Plan:  COPD Exacerbation: Her wheezing continues to improve today and she's feeling subjectively better. She's still coughing but she's getting up more sputum. She wants someone to explain her medications to her and the pharmacists helped her understand her regiment. [facility] will also see her at her house to explain her regiment. We'll stop her buspirone to dampen her medication burden. We can increase her dose of bupropion to 300mg  daily now but we'll need to taper her citalopram to 20mg  every other daily. -I'll stop the buspirone; no need to taper -Can start buproprion at 300mg  daily after 3 day taper for smoking cessation -Will taper citalopram to 20mg  every other day -Change her metoprolol from XR to IR BID because the former is not covered on her formulary -Stop her nicotine patch -Prescribe nicotine gum -I'll schedule outpatient ENT referral for vocal cord dysfunction evaluation -She has a referral to follow up with pulmonary rehabilitation -She's scheduled for weekly appointments with IMTS to taper her lorazepam -Please refer to note from 8/25 for comprehensive change in her medication regiment since admission  Dispo: Disposition is deferred at this time, awaiting  improvement of current medical problems. Hopefully today.  The patient does have a current PCP (Jones Bales, MD) and does not need an [facility] hospital follow-up appointment after discharge.  The patient does not know have transportation limitations that hinder transportation to clinic appointments.  .Services Needed at time of discharge: Y = Yes, Blank = No PT:   OT:   RN:   Equipment:   Other:     LOS: 4 days   [name], MD 07/07/2015, 8:15 AM

## 2015-07-07 NOTE — Telephone Encounter (Signed)
I have called and left a message with Mele to inquire about participation in Pulmonary Rehab per Dr. Melburn Hake referral. Will send letter in mail and follow up.

## 2015-07-07 NOTE — Progress Notes (Signed)
Inpatient Diabetes Program Recommendations  AACE/ADA: New Consensus Statement on Inpatient Glycemic Control (2013)  Target Ranges:  Prepandial:   less than 140 mg/dL      Peak postprandial:   less than 180 mg/dL (1-2 hours)      Critically ill patients:  140 - 180 mg/dL   Results for JNAI, SNELLGROVE (MRN [id number]) as of 07/07/2015 09:08  Ref. Range 07/06/2015 07:43 07/06/2015 11:05 07/06/2015 16:42 07/06/2015 21:54 07/07/2015 07:55  Glucose-Capillary Latest Ref Range: 65-99 mg/dL 143 (H) 334 (H) 354 (H) 166 (H) 139 (H)   Reason for Admission: COPD exacerbation  Diabetes history: DM 2 Outpatient Diabetes medications: Glipizide 5 mg Daily, Metformin 1,000 mg BID Current orders for Inpatient glycemic control: Novolog Resistant + HS scale  Inpatient Diabetes Program Recommendations Insulin - Meal Coverage: Glucose increased into the 300's around lunch and dinner time due to steroids. Please consider adding Novolog 6 units TID meal coverage in addition to correction. Patient was getting 15-20 units of Novolog on correction scale.  Thanks,  Tama Headings RN, MSN, [facility] Inpatient Diabetes Coordinator Team Pager [phone]

## 2015-07-07 NOTE — Progress Notes (Signed)
  Date: 07/07/2015  Patient name: [name]  Medical record number: [id number]  Date of birth: 1951/04/06   This patient has been seen and the plan of care was discussed with the house staff. Please see their note for complete details. I concur with their findings with the following additions/corrections: [name] is not having a good day - feels her breathing is flaring up a bit. When it flares, she can start to "hear herself wheeze" and also feels it in her chest. She is nervous about going home bc of all of the med changes we have made but does believe that the changes have been beneficial. She does feel that the ativan does help her nervousness.  On exam, she is more tight than on the 26th and slight end expiratory wheezing.  Plan for D/C tomorrow Inpt and outpt pharmacy will go over change and work with outpt pharmacy to cancel D/C'd meds I agree with Dr Melburn Hake to sch outpt ENT referral Monthly appt in Sept in [place] over [facility] to assist with med changes and education Insulin, basel, to help with steroid induced hyperglycemia  Bartholomew Crews, MD 07/07/2015, 2:53 PM

## 2015-07-08 DIAGNOSIS — F17210 Nicotine dependence, cigarettes, uncomplicated: Secondary | ICD-10-CM

## 2015-07-08 LAB — GLUCOSE, CAPILLARY
Glucose-Capillary: 128 mg/dL — ABNORMAL HIGH (ref 65–99)
Glucose-Capillary: 182 mg/dL — ABNORMAL HIGH (ref 65–99)

## 2015-07-08 MED ORDER — ASPIRIN 81 MG PO TBEC: 81.0000 mg | DELAYED_RELEASE_TABLET | Freq: Every day | ORAL | Status: DC

## 2015-07-08 MED ORDER — BUPROPION HCL ER (XL) 300 MG PO TB24: 300.0000 mg | ORAL_TABLET | Freq: Every day | ORAL | Status: DC

## 2015-07-08 MED ORDER — IPRATROPIUM-ALBUTEROL 0.5-2.5 (3) MG/3ML IN SOLN: 3.0000 mL | RESPIRATORY_TRACT | Status: DC | PRN

## 2015-07-08 MED ORDER — GUAIFENESIN ER 600 MG PO TB12: 1200.0000 mg | ORAL_TABLET | Freq: Two times a day (BID) | ORAL | Status: DC

## 2015-07-08 MED ORDER — ATORVASTATIN CALCIUM 80 MG PO TABS: 80.0000 mg | ORAL_TABLET | Freq: Every day | ORAL | Status: DC

## 2015-07-08 MED ORDER — CITALOPRAM HYDROBROMIDE 40 MG PO TABS: 20.0000 mg | ORAL_TABLET | ORAL | Status: DC

## 2015-07-08 MED ORDER — FLUTICASONE-SALMETEROL 230-21 MCG/ACT IN AERO: 2.0000 | INHALATION_SPRAY | Freq: Two times a day (BID) | RESPIRATORY_TRACT | Status: DC

## 2015-07-08 MED ORDER — METOPROLOL TARTRATE 50 MG PO TABS: 25.0000 mg | ORAL_TABLET | Freq: Two times a day (BID) | ORAL | Status: DC

## 2015-07-08 MED ORDER — FAMOTIDINE 20 MG PO TABS: 20.0000 mg | ORAL_TABLET | Freq: Every day | ORAL | Status: DC

## 2015-07-08 MED ORDER — METOPROLOL TARTRATE 25 MG PO TABS
25.0000 mg | ORAL_TABLET | Freq: Two times a day (BID) | ORAL | Status: DC
  Administered 2015-07-08: 25 mg via ORAL
  Filled 2015-07-08: qty 1

## 2015-07-08 MED ORDER — BUPROPION HCL ER (XL) 150 MG PO TB24: 300.0000 mg | ORAL_TABLET | Freq: Every day | ORAL | Status: DC

## 2015-07-08 MED ORDER — NICOTINE POLACRILEX 2 MG MT GUM: 2.0000 mg | CHEWING_GUM | OROMUCOSAL | Status: DC | PRN

## 2015-07-08 MED ORDER — LORAZEPAM 0.5 MG PO TABS: 0.5000 mg | ORAL_TABLET | Freq: Three times a day (TID) | ORAL | Status: DC | PRN

## 2015-07-08 NOTE — Care Management Note (Signed)
Case Management Note  Patient Details  Name: [name] MRN: [id number] Date of Birth: 12-19-1950  Subjective/Objective:     CM following for progression and d/c planning.               Action/Plan: 07/08/2015 Met with pt who is being followed by [facility] , she is aware of that services and feels that that will be adequate for her needs in management of her COPD and medications.   Expected Discharge Date:       07/08/2015           Expected Discharge Plan:  [place]  In-House Referral:  NA  Discharge planning Services  CM Consult  Post Acute Care Choice:  NA Choice offered to:  NA  DME Arranged:  N/A DME Agency:  NA  HH Arranged:  RN [place] Agency:  [place]  Status of Service:  Completed, signed off  Medicare Important Message Given:  Yes-second notification given Date Medicare IM Given:    Medicare IM give by:    Date Additional Medicare IM Given:    Additional Medicare Important Message give by:     If discussed at [place] of Stay Meetings, dates discussed:    Additional Comments:  Adron Bene, RN 07/08/2015, 11:05 AM

## 2015-07-08 NOTE — Discharge Planning (Signed)
Pt IV removed. Discharge papers given, explained and educated.  Pt informed of needed FU appts and also given script. VSS and RN assessment revealed stability for discharge.   Pt will be wheeled to car and family transporting home via car.

## 2015-07-08 NOTE — Progress Notes (Signed)
Patient ID: [name], female   DOB: 12/03/1950, 64 y.o.   MRN: [id number]   Subjective: [name] was breathing much better this morning and was ready to go home. She was quite stressed about her mother-in-law's death and all of the legal paperwork she's had to deal with. She asked me to cancel her ENT appointment and she'd reschedule it. We briefly ran through her medication regiment but I ensured her the nurse would spend some time with her to make sure he has it all straight once it's typed up for her.  Objective: Vital signs in last 24 hours: Filed Vitals:   07/08/15 0516 07/08/15 0756 07/08/15 0827 07/08/15 0901  BP: 153/68  148/72 142/57  Pulse: 81  85 88  Temp: 98.2 F (36.8 C)   98 F (36.7 C)  TempSrc: Oral   Oral  Resp: 16   18  Height:      Weight:      SpO2: 100% 100%  97%   General: resting in bed HEENT: no scleral icterus Cardiac: RRR, no rubs, murmurs or gallops Pulm: very subtly expiratory wheezes, profoundly improved from prior exams Abd: soft, nontender, nondistended, BS present Ext: warm and well perfused, no pedal edema  Medications: I have reviewed the patient's current medications. Scheduled Meds: . antiseptic oral rinse  7 mL Mouth Rinse q12n4p  . aspirin EC  81 mg Oral Daily  . atorvastatin  80 mg Oral q1800  . [START ON 07/09/2015] buPROPion  300 mg Oral Daily  . chlorhexidine  15 mL Mouth Rinse BID  . [START ON 07/09/2015] citalopram  20 mg Oral QODAY  . enoxaparin (LOVENOX) injection  50 mg Subcutaneous Q24H  . famotidine  20 mg Oral Daily  . guaiFENesin  1,200 mg Oral BID  . losartan  100 mg Oral Daily   And  . hydrochlorothiazide  25 mg Oral Daily  . insulin aspart  0-20 Units Subcutaneous TID WC  . insulin aspart  0-5 Units Subcutaneous QHS  . insulin aspart  6 Units Subcutaneous TID WC  . loratadine  10 mg Oral Daily  . metoprolol tartrate  25 mg Oral BID  . mometasone-formoterol  2 puff Inhalation BID  . predniSONE  40 mg Oral Q  breakfast  . sodium chloride  3 mL Intravenous Q12H   Continuous Infusions:  PRN Meds:.codeine, dextromethorphan, guaiFENesin, ipratropium-albuterol, LORazepam, nicotine polacrilex  Assessment/Plan:  COPD Exacerbation: Her wheezing is vastly improved and she's ready to go home today. I assured her that the nurse would go over her medications before she left and [facility] would be by her house later this week to help her as well. We made a lot of changes to her medication but we have a good plan to make sure she understands. -She'll need to call outpatient ENT to re-schedule her appointment -She has a referral to follow up with pulmonary rehabilitation -She's scheduled for weekly appointments with IMTS to taper her lorazepam  Dispo: Home today  The patient does have a current PCP (Christie Bales, MD) and does not need an [facility] hospital follow-up appointment after discharge.  The patient does have transportation limitations that hinder transportation to clinic appointments.  .Services Needed at time of discharge: Y = Yes, Blank = No PT:   OT:   RN:   Equipment:   Other:     LOS: 5 days   [name], MD 07/08/2015, 11:28 AM

## 2015-07-08 NOTE — Progress Notes (Signed)
  Date: 07/08/2015  Patient name: [name]  Medical record number: [id number]  Date of birth: Sep 13, 1951   This patient has been seen and the plan of care was discussed with the house staff. Please see their note for complete details. I concur with their findings with the following additions/corrections: She is feeling better today and is ready to go home. Pharmacy will review med changes with her and call her pharmacy. Close F/U in [facility] Lung exam better than before - not taking deep breaths bc makes her cough, min end exp wheezing.  Bartholomew Crews, MD 07/08/2015, 3:37 PM

## 2015-07-08 NOTE — Discharge Instructions (Signed)
[name],  As you know, we made many changes to your medication list. Don't hesitate to call the clinic if you have any questions, but I'm hopeful that the list I've made will make sense. Philip will also drop by your house to go over them with you.  Please be sure to follow-up pulmonary rehabilitation to learn some techniques to control your breathing and strengthen your lungs when you're having an exacerbation.  Also, the ear nose and throat secretary will be calling you to re-schedule you an appointment to look at your vocal cords.  I'm also referring you to a sleep doctor who will be in touch with you.  I'm sorry that your mother-in-law passed away. I know you're a big support to [place] and I admire you for that, especially in the face of your breathing troubles and recent hospitalizations.  Take care, Dr. Melburn Hake

## 2015-07-09 ENCOUNTER — Other Ambulatory Visit: Payer: Self-pay | Admitting: Pharmacist

## 2015-07-09 ENCOUNTER — Other Ambulatory Visit: Payer: Self-pay | Admitting: *Deleted

## 2015-07-09 DIAGNOSIS — I10 Essential (primary) hypertension: Secondary | ICD-10-CM

## 2015-07-09 MED ORDER — LOSARTAN POTASSIUM-HCTZ 100-25 MG PO TABS: 1.0000 | ORAL_TABLET | Freq: Every day | ORAL | Status: DC

## 2015-07-09 NOTE — Patient Outreach (Signed)
Per hospital liaison, V. Brewer, member is still interested in being involved with [facility] care management services.  Member was discharged from hospital on yesterday, 8/30, for COPD exacerbation.  Call placed to member's preferred number [phone]), no answer, unable to leave voice message due to mailbox being full.  Call placed to husband, Etana Beets [phone]), he states that the member is home and he is at work.  This care manager provided husband with contact information and requested for him to give to member and have her call this care manager tomorrow.  Will await call back, will provide another call tomorrow.  [name], BSN, [place] Management  [facility] Care Manager [phone]

## 2015-07-09 NOTE — Patient Outreach (Signed)
[facility]) Care Management  07/09/2015  [name] 1951-01-26 [id number]   [name] is a 64 year old female referred to [place] from [place], Natividad Brood, for assistance with smoking cessation.  I called [name] to set up initial pharmacy visit for smoking cessation.  Patient did not answer and voicemail box was full.  I will try to reach out to the patient again on Thursday, September 1st to try to set up an initial home visit.    Elisabeth Most, Pharm.D. Pharmacy Resident [place]

## 2015-07-10 ENCOUNTER — Other Ambulatory Visit: Payer: Self-pay | Admitting: *Deleted

## 2015-07-10 ENCOUNTER — Ambulatory Visit: Payer: Medicare Other | Admitting: Internal Medicine

## 2015-07-10 ENCOUNTER — Other Ambulatory Visit: Payer: Self-pay | Admitting: Pharmacist

## 2015-07-10 NOTE — Patient Outreach (Signed)
2nd attempt made to contact member since her most recent discharge, unsuccessful.  HIPPA compliant voice message left.  Will await call back.  Will continue with 3rd attempt next week.  [name], BSN, [place] Management  [facility] Care Manager [phone]

## 2015-07-10 NOTE — Patient Outreach (Signed)
[facility]) Care Management  07/10/2015  TALEIGH GERO 1951-04-23 [id number]   Skyleigh Windle is a 64 year old female referred to [place] from [place], Natividad Brood, for assistance with smoking cessation. Second attempt to contact [name] to set up initial pharmacy visit for smoking cessation. Patient did not answer and voicemail box was full. I will try to reach out to the patient again next week to try to set up an initial home visit.    Elisabeth Most, Pharm.D. Pharmacy Resident [place]

## 2015-07-11 ENCOUNTER — Telehealth: Payer: Self-pay | Admitting: Licensed Clinical Social Worker

## 2015-07-11 NOTE — Telephone Encounter (Signed)
[name] was referred to CSW for counseling resources.  CSW placed call to [name].  Pt states "maybe, but not right now.  I have a lot going on."  [name] interested in counseling services but not at this moment.  CSW will mail letter to [name] with resources and this workers contact information.  Pt denies add'l social work needs at this time.

## 2015-07-15 ENCOUNTER — Other Ambulatory Visit: Payer: Self-pay | Admitting: *Deleted

## 2015-07-15 NOTE — Patient Outreach (Signed)
Several attempts made to contact member at the preferred number, [phone].  3 attempts made between 8/12-8/19, outreach letter sent with no response.  Member was readmitted on 8/25 and discharged 8/30.  Four attempts have been made to both member and her husband since this most recent discharge, messages left on member's voice mail and message left with husband (was able to connect with him once).  Will send another outreach letter, if no call back within 10 days, will close case.  [name], BSN, [place] Management  [facility] Care Manager [phone]

## 2015-07-16 ENCOUNTER — Other Ambulatory Visit: Payer: Self-pay | Admitting: Pharmacist

## 2015-07-16 NOTE — Patient Outreach (Signed)
[facility]) Care Management  07/16/2015  DEWEY NEUKAM [birth year] [id number]  Sidra Oldfield is a 64 year old female referred to [place] from [place], Natividad Brood, for assistance with smoking cessation. Third attempt to contact [name] to set up initial pharmacy visit for smoking cessation. Patient did not answer. I left a HIPAA compliant voice message for the patient to return my phone call.  I will await for patient's return call.  If I do not hear back from patient this week, I will send an outreach letter.    Elisabeth Most, Pharm.D. Pharmacy Resident [place]

## 2015-07-17 ENCOUNTER — Telehealth: Payer: Self-pay | Admitting: Internal Medicine

## 2015-07-17 ENCOUNTER — Encounter: Payer: Self-pay | Admitting: Internal Medicine

## 2015-07-17 ENCOUNTER — Ambulatory Visit (INDEPENDENT_AMBULATORY_CARE_PROVIDER_SITE_OTHER): Payer: Medicare Other | Admitting: Internal Medicine

## 2015-07-17 VITALS — BP 127/68 | HR 78 | Temp 98.3°F | Ht 67.0 in | Wt 224.0 lb

## 2015-07-17 DIAGNOSIS — E1169 Type 2 diabetes mellitus with other specified complication: Secondary | ICD-10-CM

## 2015-07-17 DIAGNOSIS — E1165 Type 2 diabetes mellitus with hyperglycemia: Secondary | ICD-10-CM

## 2015-07-17 DIAGNOSIS — Z7951 Long term (current) use of inhaled steroids: Secondary | ICD-10-CM

## 2015-07-17 DIAGNOSIS — F17210 Nicotine dependence, cigarettes, uncomplicated: Secondary | ICD-10-CM

## 2015-07-17 DIAGNOSIS — J449 Chronic obstructive pulmonary disease, unspecified: Secondary | ICD-10-CM

## 2015-07-17 DIAGNOSIS — E784 Other hyperlipidemia: Secondary | ICD-10-CM

## 2015-07-17 DIAGNOSIS — E785 Hyperlipidemia, unspecified: Principal | ICD-10-CM

## 2015-07-17 DIAGNOSIS — E118 Type 2 diabetes mellitus with unspecified complications: Secondary | ICD-10-CM

## 2015-07-17 DIAGNOSIS — F418 Other specified anxiety disorders: Secondary | ICD-10-CM

## 2015-07-17 DIAGNOSIS — J441 Chronic obstructive pulmonary disease with (acute) exacerbation: Secondary | ICD-10-CM

## 2015-07-17 LAB — GLUCOSE, CAPILLARY: GLUCOSE-CAPILLARY: 119 mg/dL — AB (ref 65–99)

## 2015-07-17 LAB — POCT GLYCOSYLATED HEMOGLOBIN (HGB A1C): Hemoglobin A1C: 7.7

## 2015-07-17 MED ORDER — SIMVASTATIN 40 MG PO TABS: 40.0000 mg | ORAL_TABLET | Freq: Every day | ORAL | Status: DC

## 2015-07-17 MED ORDER — ROSUVASTATIN CALCIUM 10 MG PO TABS: 10.0000 mg | ORAL_TABLET | Freq: Every day | ORAL | Status: DC

## 2015-07-17 NOTE — Assessment & Plan Note (Signed)
Hgba1c- 7.7. She is presently on metformin only. Increased Hgba1c likely due to multiple COPD exacerbations, requiring use of steroids. Will not make any adjustments today, she used to be on glipizide.  If next Hgba1c is still elevated consider restarting glipizide.

## 2015-07-17 NOTE — Assessment & Plan Note (Signed)
Citalopram weaned off. She is now on Bupropion and lorazepam, with plans to wean her off lorazepam. She is su[pposed to be on 0.5mg  TID, she was prescribed 30tabs. Today she brings her pill bottle and she has been taking it only as needed. Some day she takes 2 tabs, most days she takes one or none. She has about 15pills left.  - Pt dose not want to come in 2 week,s she would rather come in 1 month.

## 2015-07-17 NOTE — Assessment & Plan Note (Signed)
She is due for a mammogram. But she wants to get the Sleep study done first- next month. Order for mammogram can be placed next visit, she says previous orders for mammograms have been put in when when she has not been able to follow up due to been ill, hospitalizations or other appointments.

## 2015-07-17 NOTE — Assessment & Plan Note (Signed)
>>  ASSESSMENT AND PLAN FOR GAD (GENERALIZED ANXIETY DISORDER) WRITTEN ON 07/17/2015  3:35 PM BY EMOKPAE, Leanne Chang, MD  Citalopram weaned off. She is now on Bupropion and lorazepam, with plans to wean her off lorazepam. She is su[pposed to be on 0.5mg  TID, she was prescribed 30tabs. Today she brings her pill bottle and she has been taking it only as needed. Some day she takes 2 tabs, most days she takes one or none. She has about 15pills left.  - Pt dose not want to come in 2 week,s she would rather come in 1 month.

## 2015-07-17 NOTE — Assessment & Plan Note (Signed)
Appears be be back to baseline. Complaint with her inhalers. Was also given new meds for anxiety which was thought to be contributing to SOB. She is also to be evaluated for Sleep apnea- she has poor sleep, tired and sleep during the day, snores at night. Vocal cord dysfunction was also considered.  - Pt has an appointment with Pulm 0ct/2016.  - Cont Advair and Duonebs.

## 2015-07-17 NOTE — Telephone Encounter (Signed)
Talked with Merrilee Seashore and PA is required for Crestor.   She must try and fail simvastatin or lovastatin first.   Would you like to change to one of these?

## 2015-07-17 NOTE — Telephone Encounter (Signed)
Merrilee Seashore from [place] called regarding pt Rx.

## 2015-07-17 NOTE — Progress Notes (Signed)
Patient ID: [name], female   DOB: Mar 21, 1951, 64 y.o.   MRN: [id number]   Subjective:   [name]   DOB: 1951-08-08 64 y.o.   MRN: [id number]  HPI: [name] is a 64 y.o. with PMH listed below, presented today for hospital follow up. Pt was admitted- 8/25- 07/08/2015. She was managed for COPD exacerbation. She was started on some new antianxiety meds, which she has been taking- Bupropion and Lorazepam, and tolerating. She has stopped taking her citalopram which was weaned off.  Today she is doing very well, has no complaints. Her breathing she says is back to baseline, without coughing or sputum production.   Past Medical History  Diagnosis Date  . Hyperlipidemia   . Hypertension   . Tobacco abuse   . COPD (chronic obstructive pulmonary disease)     History of multiple hospital admissions for exercabation   . Asthma   . Breast cancer 1991    s/p lumpectomy, chemotherapy and radiation therapy in 1991. Mammogram in 2007 was normal.  . Sigmoid diverticulitis 80/2008  . Anxiety   . Depression   . Obesity   . GERD (gastroesophageal reflux disease)   . Heart murmur 10/05/11    "first time I ever heard I had one was today"  . Pneumonia   . Shortness of breath 10/05/11    "at rest; lying down; w/exertion"  . Diabetes mellitus   . Bronchitis     h/o  . Diarrhea     h/o  . Constipated     h/o  . COPD with exacerbation 04/06/2009    Qualifier: Diagnosis of  By: Eyvonne Mechanic MD, Vijay     Current Outpatient Prescriptions  Medication Sig Dispense Refill  . ACCU-CHEK FASTCLIX LANCETS MISC 1 each by Other route See admin instructions. Check blood sugar daily as needed for high blood sugar.    Marland Kitchen acetaminophen (TYLENOL) 325 MG tablet Take 650 mg by mouth 2 (two) times daily as needed for headache.    . albuterol (VENTOLIN HFA) 108 (90 BASE) MCG/ACT inhaler Inhale 2 puffs into the lungs every 6 (six) hours as needed for wheezing or shortness of  breath. 1 Inhaler 3  . aspirin EC 81 MG EC tablet Take 1 tablet (81 mg total) by mouth daily. 90 tablet 3  . Blood Glucose Monitoring Suppl (ACCU-CHEK NANO SMARTVIEW) W/DEVICE KIT 1 each by Other route See admin instructions. Check blood sugar daily as needed for high blood sugar.    Marland Kitchen buPROPion (WELLBUTRIN XL) 300 MG 24 hr tablet Take 1 tablet (300 mg total) by mouth daily. To help your smoking urge, depression, and anxiety 90 tablet 3  . famotidine (PEPCID) 20 MG tablet Take 1 tablet (20 mg total) by mouth daily. For reflux, which can make your cough worse 90 tablet 3  . fluticasone-salmeterol (ADVAIR HFA) 230-21 MCG/ACT inhaler Inhale 2 puffs into the lungs 2 (two) times daily. 1 Inhaler 12  . glipiZIDE (GLUCOTROL) 5 MG tablet Take 1 tablet (5 mg total) by mouth daily before breakfast. TAKE ONLY IF BLOOD SUGAR IS HIGHER THAN 200. MONITOR BLOOD SUGAR AT LEAST 2 TIMES DAILY WHILE TAKING PREDNISONE. 3 tablet 0  . glucose blood (ACCU-CHEK SMARTVIEW) test strip Use to check blood sugar 1 to 2 times daily. diag code E 11.9. Non- insulin dependent 100 each 6  . guaiFENesin (MUCINEX) 600 MG 12 hr tablet Take 2 tablets (1,200 mg total) by mouth 2 (two) times daily.  30 tablet 0  . ipratropium-albuterol (DUONEB) 0.5-2.5 (3) MG/3ML SOLN Take 3 mLs by nebulization every 2 (two) hours as needed. 360 mL 9  . loratadine (CLARITIN) 10 MG tablet Take 1 tablet (10 mg total) by mouth daily. 90 tablet 3  . LORazepam (ATIVAN) 0.5 MG tablet Take 1 tablet (0.5 mg total) by mouth every 8 (eight) hours as needed for anxiety or sleep. 30 tablet 0  . losartan-hydrochlorothiazide (HYZAAR) 100-25 MG per tablet Take 1 tablet by mouth daily. 30 tablet 3  . metFORMIN (GLUCOPHAGE) 1000 MG tablet Take 1 tablet (1,000 mg total) by mouth 2 (two) times daily with a meal. 60 tablet 11  . metoprolol tartrate (LOPRESSOR) 50 MG tablet Take 0.5 tablets (25 mg total) by mouth 2 (two) times daily. 90 tablet 9  . nicotine polacrilex  (NICORETTE) 2 MG gum Take 1 each (2 mg total) by mouth as needed for smoking cessation. 100 tablet 0  . rosuvastatin (CRESTOR) 10 MG tablet Take 1 tablet (10 mg total) by mouth daily. 30 tablet 2  . [DISCONTINUED] albuterol (PROVENTIL,VENTOLIN) 90 MCG/ACT inhaler Inhale 2 puffs into the lungs every 6 (six) hours as needed for wheezing. 17 g 12   No current facility-administered medications for this visit.   Family History  Problem Relation Age of Onset  . Cancer Mother    Social History   Social History  . Marital Status: Married    Spouse Name: N/A  . Number of Children: N/A  . Years of Education: 12   Occupational History  .  Unemployed   Social History Main Topics  . Smoking status: Current Some Day Smoker -- 0.10 packs/day for 40 years    Types: Cigarettes    Last Attempt to Quit: 09/11/2014  . Smokeless tobacco: Never Used     Comment: slowly quitting.DOWN TO ABOUT 6 CIGARETTES PER DAY  . Alcohol Use: 2.4 oz/week    4 Cans of beer per week     Comment: "only on the weekends"  . Drug Use: No  . Sexual Activity: Not Asked   Other Topics Concern  . None   Social History Narrative   Lives in [place] with her husband.   Takes care of 3 grand children.   Trying to find a job, has financial difficulties.         Review of Systems: CONSTITUTIONAL- No Fever SKIN- No Rash, colour changes or itching. CARDIAC- No chest pain. GI- No vomiting, diarrhoea, abd pain. URINARY- No Frequency, urgency, straining or dysuria.  Objective:  Physical Exam: Filed Vitals:   07/17/15 1058  BP: 127/68  Pulse: 78  Temp: 98.3 F (36.8 C)  TempSrc: Oral  Height: 5' 7" (1.702 m)  Weight: 224 lb (101.606 kg)  SpO2: 100%   GENERAL- alert, co-operative, appears as stated age, not in any distress. HEENT- Atraumatic, normocephalic, PERRL, oral mucosa appears moist CARDIAC- RRR, no murmurs, rubs or gallops. RESP- Moving equal volumes of air, and clear to auscultation bilaterally, no  wheezes or crackles. ABDOMEN- Soft, nontender,  bowel sounds present. NEURO- Alert and oriented. EXTREMITIES- pulse 2+, symmetric, no pedal edema. SKIN- Warm, dry, No rash or lesion. PSYCH- Normal mood and affect, appropriate thought content and speech.  Assessment & Plan:  The patient's case and plan of care was discussed with attending physician, Dr. Daryll Drown.  Please see problem based charting for assessment and plan.

## 2015-07-17 NOTE — Patient Instructions (Signed)
Please continue to work on quitting smoking.  Also we will let you know about the sleep study.   Please use the Ativan sparingly, and only when you are having really severe anxiety symptoms.

## 2015-07-17 NOTE — Assessment & Plan Note (Signed)
>>  ASSESSMENT AND PLAN FOR CHRONIC OBSTRUCTIVE PULMONARY DISEASE WITH BRONCHOSPASM ([place]) WRITTEN ON 07/17/2015  3:28 PM BY EMOKPAE, EJIROGHENE E, MD  Appears be be back to baseline. Complaint with her inhalers. Was also given new meds for anxiety which was thought to be contributing to SOB. She is also to be evaluated for Sleep apnea- she has poor sleep, tired and sleep during the day, snores at night. Vocal cord dysfunction was also considered.  - Pt has an appointment with Pulm 0ct/2016.  - Cont Advair and Duonebs.

## 2015-07-17 NOTE — Assessment & Plan Note (Signed)
Switched to atorvastatin from pravasatin on admission. But her pharmacy does not have this med. Will therefore prescribe crestor- 10mg  Daily.

## 2015-07-22 NOTE — Progress Notes (Signed)
Internal Medicine Clinic Attending  Case discussed with Dr. Emokpae soon after the resident saw the patient.  We reviewed the resident's history and exam and pertinent patient test results.  I agree with the assessment, diagnosis, and plan of care documented in the resident's note. 

## 2015-07-23 ENCOUNTER — Encounter: Payer: Self-pay | Admitting: Pharmacist

## 2015-07-23 NOTE — Addendum Note (Signed)
Addended by: Hulan Fray on: 07/23/2015 09:02 PM   Modules accepted: Orders

## 2015-07-24 ENCOUNTER — Other Ambulatory Visit: Payer: Self-pay | Admitting: *Deleted

## 2015-07-24 ENCOUNTER — Ambulatory Visit (INDEPENDENT_AMBULATORY_CARE_PROVIDER_SITE_OTHER): Payer: Medicare Other | Admitting: Internal Medicine

## 2015-07-24 ENCOUNTER — Encounter: Payer: Self-pay | Admitting: Internal Medicine

## 2015-07-24 ENCOUNTER — Encounter: Payer: Self-pay | Admitting: *Deleted

## 2015-07-24 VITALS — BP 147/73 | HR 83 | Temp 98.0°F | Ht 67.0 in | Wt 222.2 lb

## 2015-07-24 DIAGNOSIS — F17210 Nicotine dependence, cigarettes, uncomplicated: Secondary | ICD-10-CM

## 2015-07-24 DIAGNOSIS — F418 Other specified anxiety disorders: Secondary | ICD-10-CM | POA: Diagnosis not present

## 2015-07-24 DIAGNOSIS — E785 Hyperlipidemia, unspecified: Secondary | ICD-10-CM

## 2015-07-24 MED ORDER — PRAVASTATIN SODIUM 40 MG PO TABS: 40.0000 mg | ORAL_TABLET | Freq: Every day | ORAL | Status: DC

## 2015-07-24 NOTE — Progress Notes (Signed)
Patient ID: [name], female   DOB: December 12, 1950, 64 y.o.   MRN: [id number]   Subjective:   Patient ID: THEODORA LALANNE female   DOB: 16-Mar-1951 64 y.o.   MRN: [id number]  HPI: [facility] representative was present.  Ms.Denene A Skow is a 64 y.o. with PMH listed below, presented today for follow up of her anxiety. Only complainst today is of pounding sensation in her ears which she has intermittently.   Past Medical History  Diagnosis Date  . Hyperlipidemia   . Hypertension   . Tobacco abuse   . COPD (chronic obstructive pulmonary disease)     History of multiple hospital admissions for exercabation   . Asthma   . Breast cancer 1991    s/p lumpectomy, chemotherapy and radiation therapy in 1991. Mammogram in 2007 was normal.  . Sigmoid diverticulitis 80/2008  . Anxiety   . Depression   . Obesity   . GERD (gastroesophageal reflux disease)   . Heart murmur 10/05/11    "first time I ever heard I had one was today"  . Pneumonia   . Shortness of breath 10/05/11    "at rest; lying down; w/exertion"  . Diabetes mellitus   . Bronchitis     h/o  . Diarrhea     h/o  . Constipated     h/o  . COPD with exacerbation 04/06/2009    Qualifier: Diagnosis of  By: Eyvonne Mechanic MD, Vijay     Current Outpatient Prescriptions  Medication Sig Dispense Refill  . ACCU-CHEK FASTCLIX LANCETS MISC 1 each by Other route See admin instructions. Check blood sugar daily as needed for high blood sugar.    Marland Kitchen acetaminophen (TYLENOL) 325 MG tablet Take 650 mg by mouth 2 (two) times daily as needed for headache.    . albuterol (VENTOLIN HFA) 108 (90 BASE) MCG/ACT inhaler Inhale 2 puffs into the lungs every 6 (six) hours as needed for wheezing or shortness of breath. 1 Inhaler 3  . aspirin EC 81 MG EC tablet Take 1 tablet (81 mg total) by mouth daily. 90 tablet 3  . Blood Glucose Monitoring Suppl (ACCU-CHEK NANO SMARTVIEW) W/DEVICE KIT 1 each by Other route See admin instructions. Check blood sugar daily as  needed for high blood sugar.    Marland Kitchen buPROPion (WELLBUTRIN XL) 300 MG 24 hr tablet Take 1 tablet (300 mg total) by mouth daily. To help your smoking urge, depression, and anxiety 90 tablet 3  . famotidine (PEPCID) 20 MG tablet Take 1 tablet (20 mg total) by mouth daily. For reflux, which can make your cough worse 90 tablet 3  . fluticasone-salmeterol (ADVAIR HFA) 230-21 MCG/ACT inhaler Inhale 2 puffs into the lungs 2 (two) times daily. 1 Inhaler 12  . glucose blood (ACCU-CHEK SMARTVIEW) test strip Use to check blood sugar 1 to 2 times daily. diag code E 11.9. Non- insulin dependent 100 each 6  . guaiFENesin (MUCINEX) 600 MG 12 hr tablet Take 2 tablets (1,200 mg total) by mouth 2 (two) times daily. 30 tablet 0  . ipratropium-albuterol (DUONEB) 0.5-2.5 (3) MG/3ML SOLN Take 3 mLs by nebulization every 2 (two) hours as needed. 360 mL 9  . loratadine (CLARITIN) 10 MG tablet Take 1 tablet (10 mg total) by mouth daily. 90 tablet 3  . LORazepam (ATIVAN) 0.5 MG tablet Take 1 tablet (0.5 mg total) by mouth every 8 (eight) hours as needed for anxiety or sleep. 30 tablet 0  . losartan-hydrochlorothiazide (HYZAAR) 100-25 MG per tablet Take 1  tablet by mouth daily. 30 tablet 3  . metFORMIN (GLUCOPHAGE) 1000 MG tablet Take 1 tablet (1,000 mg total) by mouth 2 (two) times daily with a meal. 60 tablet 11  . metoprolol tartrate (LOPRESSOR) 50 MG tablet Take 0.5 tablets (25 mg total) by mouth 2 (two) times daily. 90 tablet 9  . nicotine polacrilex (NICORETTE) 2 MG gum Take 1 each (2 mg total) by mouth as needed for smoking cessation. 100 tablet 0  . pravastatin (PRAVACHOL) 40 MG tablet Take 1 tablet (40 mg total) by mouth daily. 30 tablet 0  . [DISCONTINUED] albuterol (PROVENTIL,VENTOLIN) 90 MCG/ACT inhaler Inhale 2 puffs into the lungs every 6 (six) hours as needed for wheezing. 17 g 12   No current facility-administered medications for this visit.   Family History  Problem Relation Age of Onset  . Cancer Mother     Social History   Social History  . Marital Status: Married    Spouse Name: N/A  . Number of Children: N/A  . Years of Education: 12   Occupational History  .  Unemployed   Social History Main Topics  . Smoking status: Current Some Day Smoker -- 0.10 packs/day for 40 years    Types: Cigarettes    Last Attempt to Quit: 09/11/2014  . Smokeless tobacco: Never Used     Comment: slowly quitting.DOWN TO ABOUT 6 CIGARETTES PER DAY  . Alcohol Use: 2.4 oz/week    4 Cans of beer per week     Comment: "only on the weekends"  . Drug Use: No  . Sexual Activity: Not Asked   Other Topics Concern  . None   Social History Narrative   Lives in [place] with her husband.   Takes care of 3 grand children.   Trying to find a job, has financial difficulties.         Review of Systems: CONSTITUTIONAL- No Fever, weightloss, or change in appetite. SKIN- No Rash, colour changes or itching. HEAD- No Headache or dizziness. RESPIRATORY- No Cough or SOB. CARDIAC- No Palpitations, or chest pain. GI- No  vomiting, diarrhoea,  abd pain. URINARY- No Frequency, urgency, straining or dysuria.  Objective:  Physical Exam: Filed Vitals:   07/24/15 1023  BP: 147/73  Pulse: 83  Temp: 98 F (36.7 C)  TempSrc: Oral  Height: _0  (1.702 m)  Weight: 222 lb 3.2 oz (100.789 kg)  SpO2: 98%   GENERAL- alert, co-operative,  not in any distress. HEENT- Atraumatic, normocephalic, PERRL, neck supple. CARDIAC- RRR, no murmurs, rubs or gallops. RESP- Moving equal volumes of air, and clear to auscultation bilaterally, no wheezes or crackles. ABDOMEN- Soft, nontender, bowel sounds present. NEURO- Alert and oriented, Gait- Normal. EXTREMITIES- pulse 2+, symmetric, no pedal edema. SKIN- Warm, dry, No rash or lesion. PSYCH- Normal mood and affect, appropriate thought content and speech.  Assessment & Plan:   The patient's case and plan of care was discussed with attending physician, Dr.  Ellwood Dense.  Please see problem based charting for assessment and plan.

## 2015-07-24 NOTE — Patient Instructions (Signed)
Please cut the pill you are currently taking in 1/2- Ativan/lorazepam.   We will see you in 1 week.       Generalized Anxiety Disorder Generalized anxiety disorder (GAD) is a mental disorder. It interferes with life functions, including relationships, work, and school. GAD is different from normal anxiety, which everyone experiences at some point in their lives in response to specific life events and activities. Normal anxiety actually helps [place] prepare for and get through these life events and activities. Normal anxiety goes away after the event or activity is over.  GAD causes anxiety that is not necessarily related to specific events or activities. It also causes excess anxiety in proportion to specific events or activities. The anxiety associated with GAD is also difficult to control. GAD can vary from mild to severe. People with severe GAD can have intense waves of anxiety with physical symptoms (panic attacks).  SYMPTOMS The anxiety and worry associated with GAD are difficult to control. This anxiety and worry are related to many life events and activities and also occur more days than not for 6 months or longer. People with GAD also have three or more of the following symptoms (one or more in children):  Restlessness.   Fatigue.  Difficulty concentrating.   Irritability.  Muscle tension.  Difficulty sleeping or unsatisfying sleep. DIAGNOSIS GAD is diagnosed through an assessment by your health care provider. Your health care provider will ask you questions aboutyour mood,physical symptoms, and events in your life. Your health care provider may ask you about your medical history and use of alcohol or drugs, including prescription medicines. Your health care provider may also do a physical exam and blood tests. Certain medical conditions and the use of certain substances can cause symptoms similar to those associated with GAD. Your health care provider may refer you to a mental  health specialist for further evaluation. TREATMENT The following therapies are usually used to treat GAD:   Medication. Antidepressant medication usually is prescribed for long-term daily control. Antianxiety medicines may be added in severe cases, especially when panic attacks occur.   Talk therapy (psychotherapy). Certain types of talk therapy can be helpful in treating GAD by providing support, education, and guidance. A form of talk therapy called cognitive behavioral therapy can teach you healthy ways to think about and react to daily life events and activities.  Stress managementtechniques. These include yoga, meditation, and exercise and can be very helpful when they are practiced regularly. A mental health specialist can help determine which treatment is best for you. Some people see improvement with one therapy. However, other people require a combination of therapies.                                                                                                 Pulsing in the ear - anxiety symptom  Pulsing in the ear, throbbing sound: Pulsing sound in the ear(s), ache or pain, shooting pains, heart-pulsing like sound, swishing, swirling, throbbing, or beating sound in or seemingly behind the ear. It can occur in one or both ears, and can randomly shift from ear to ear or from one ear to both ears.  This symptom might also feel as though your heart is beating in your ear.  Sometimes this symptom is diagnosed as Pulsatile (throbbing) tinnitus.  Stress and anxiety caused pulsing in the ear sound should NOT be considered a serious medical problem. Many people experience this symptom when stressed and/or overly anxious. In many cases, this symptom subsides on its own when stress and/or anxiety have been reduced and the body has had sufficient time to calm down and recover.  There are many natural and practical ways to eliminate this  symptom. We describe these ways in Chapter 4 and Chapter 9 in the member's area of our website.  While this symptom isn't harmful, it is telling you that your stress and/or anxiety are elevated. Stress and anxiety symptoms, such as pulsing in your ear, are indications that you should take action to better manage your anxiety and stress, since prolonged elevated stress and anxiety can contribute to health consequences if left unaddressed.

## 2015-07-24 NOTE — Patient Outreach (Signed)
[facility]) Care Management   07/24/2015  [name] [birth year] [id number]  [name] is an 64 y.o. female  Subjective:   Objective:   Review of Systems  Constitutional: Negative.   HENT: Negative.   Eyes: Negative.   Respiratory: Negative.   Cardiovascular: Negative.   Gastrointestinal: Negative.   Genitourinary: Negative.   Musculoskeletal: Negative.   Skin: Negative.   Neurological: Negative.   Endo/Heme/Allergies: Negative.   Psychiatric/Behavioral: Negative.     Physical Exam  Constitutional: She is oriented to person, place, and time. She appears well-developed.  Neurological: She is alert and oriented to person, place, and time.    Current Medications:   Current Outpatient Prescriptions  Medication Sig Dispense Refill  . ACCU-CHEK FASTCLIX LANCETS MISC 1 each by Other route See admin instructions. Check blood sugar daily as needed for high blood sugar.    Marland Kitchen acetaminophen (TYLENOL) 325 MG tablet Take 650 mg by mouth 2 (two) times daily as needed for headache.    Marland Kitchen aspirin EC 81 MG EC tablet Take 1 tablet (81 mg total) by mouth daily. 90 tablet 3  . Blood Glucose Monitoring Suppl (ACCU-CHEK NANO SMARTVIEW) W/DEVICE KIT 1 each by Other route See admin instructions. Check blood sugar daily as needed for high blood sugar.    Marland Kitchen buPROPion (WELLBUTRIN XL) 300 MG 24 hr tablet Take 1 tablet (300 mg total) by mouth daily. To help your smoking urge, depression, and anxiety 90 tablet 3  . glucose blood (ACCU-CHEK SMARTVIEW) test strip Use to check blood sugar 1 to 2 times daily. diag code E 11.9. Non- insulin dependent 100 each 6  . ipratropium-albuterol (DUONEB) 0.5-2.5 (3) MG/3ML SOLN Take 3 mLs by nebulization every 2 (two) hours as needed. 360 mL 9  . loratadine (CLARITIN) 10 MG tablet Take 1 tablet (10 mg total) by mouth daily. 90 tablet 3  . LORazepam (ATIVAN) 0.5 MG tablet Take 1 tablet (0.5 mg total) by mouth every 8 (eight) hours as  needed for anxiety or sleep. 30 tablet 0  . losartan-hydrochlorothiazide (HYZAAR) 100-25 MG per tablet Take 1 tablet by mouth daily. 30 tablet 3  . metFORMIN (GLUCOPHAGE) 1000 MG tablet Take 1 tablet (1,000 mg total) by mouth 2 (two) times daily with a meal. 60 tablet 11  . metoprolol tartrate (LOPRESSOR) 50 MG tablet Take 0.5 tablets (25 mg total) by mouth 2 (two) times daily. 90 tablet 9  . pravastatin (PRAVACHOL) 40 MG tablet Take 1 tablet (40 mg total) by mouth daily. 30 tablet 0  . albuterol (VENTOLIN HFA) 108 (90 BASE) MCG/ACT inhaler Inhale 2 puffs into the lungs every 6 (six) hours as needed for wheezing or shortness of breath. (Patient not taking: Reported on 07/24/2015) 1 Inhaler 3  . famotidine (PEPCID) 20 MG tablet Take 1 tablet (20 mg total) by mouth daily. For reflux, which can make your cough worse (Patient not taking: Reported on 07/24/2015) 90 tablet 3  . fluticasone-salmeterol (ADVAIR HFA) 230-21 MCG/ACT inhaler Inhale 2 puffs into the lungs 2 (two) times daily. (Patient not taking: Reported on 07/24/2015) 1 Inhaler 12  . guaiFENesin (MUCINEX) 600 MG 12 hr tablet Take 2 tablets (1,200 mg total) by mouth 2 (two) times daily. (Patient not taking: Reported on 07/24/2015) 30 tablet 0  . nicotine polacrilex (NICORETTE) 2 MG gum Take 1 each (2 mg total) by mouth as needed for smoking cessation. (Patient not taking: Reported on 07/24/2015) 100 tablet 0  . [DISCONTINUED] albuterol (PROVENTIL,VENTOLIN)  90 MCG/ACT inhaler Inhale 2 puffs into the lungs every 6 (six) hours as needed for wheezing. 17 g 12   No current facility-administered medications for this visit.    Functional Status:   In your present state of health, do you have any difficulty performing the following activities: 07/24/2015 07/24/2015  Hearing? - N  Vision? - Y  Difficulty concentrating or making decisions? - N  Walking or climbing stairs? - N  Dressing or bathing? - N  Doing errands, shopping? - N  Conservation officer, nature and  eating ? - N  Using the Toilet? - N  In the past six months, have you accidently leaked urine? N N  Do you have problems with loss of bowel control? - N  Managing your Medications? - N  Managing your Finances? - N  Housekeeping or managing your Housekeeping? - N    Fall/Depression Screening:    PHQ 2/9 Scores 07/24/2015 07/24/2015 07/17/2015 06/13/2015 05/23/2015 04/14/2015 04/08/2015  PHQ - 2 Score 0 0 0 0 0 0 0  PHQ- 9 Score - - - - - - -    Assessment:    Met with member at physician office.  Introduced self and the purpose of the meeting.  [facility] care management services explained, member states that she agreed to services while in the hospital and is still interested in services.  She states that she is sleep most of the time during the day because she does not sleep well at night.  She reports that she has a sleep study scheduled to assess the reason for her insomnia.  Medications reviewed, member does not have her inhalers with her.  She states that the inhalers listed on her medication list is not what she is currently taking, however can't remember the name of the ones she is using.  Member is agreeable to a home visit to assess further community needs and to verify medications that are at the home.  Member has follow up appointment with PCP scheduled for next week.  Plan:   Home visit scheduled for next week.  [facility] CM Care Plan Problem One        Most Recent Value   Care Plan Problem One  Recent hospital admission with diagnosis of COPD   Role Documenting the Problem One  Care Management Coordinator   Care Plan for Problem One  Active   [facility] Long Term Goal (31-90 days)  Member will not be readmitted to hospital with COPD related condition within the next 31 days   [facility] Long Term Goal Start Date  07/24/15   Interventions for Problem One Long Term Goal  Discussed with member the importance of following discharge instructions, including follow up appointments, medications, diet, and home  health involvement, to decrease the risk of readmission   [facility] CM Short Term Goal #1 (0-30 days)  Member will take medications as prescribed over the next 4 weeks   [facility] CM Short Term Goal #1 Start Date  07/24/15   Interventions for Short Term Goal #1  Discussed with member the importance of following discharge instructions, including follow up appointments, medications, diet, and home health involvement, to decrease the risk of readmission   [facility] CM Short Term Goal #2 (0-30 days)  Member will have follow up appointment with primary care provider within the next 4 weeks   [facility] CM Short Term Goal #2 Start Date  07/24/15   Interventions for Short Term Goal #2  Discussed with member the importance of  following discharge instructions, including follow up appointments, medications, diet, and home health involvement, to decrease the risk of readmission     [name], BSN, [place] Manager [phone]

## 2015-07-24 NOTE — Progress Notes (Signed)
Internal Medicine Clinic Attending  Case discussed with Dr. Emokpae soon after the resident saw the patient.  We reviewed the resident's history and exam and pertinent patient test results.  I agree with the assessment, diagnosis, and plan of care documented in the resident's note. 

## 2015-07-24 NOTE — Assessment & Plan Note (Signed)
Pt taking wellbutrin everyday. She is also taking the ativan when she really feels anxious. She brought her pill bottle and has 10pills left. She has been taking 1 tablet once a day, and hardly everyday. She is not pleased that she has to come back every week. But i explained the importance of this visits. She says she sometimes feels he heart beat in her ears, but so far most of her anxiety symptoms appear controlled.  Plan- Considering she is not taking Ativan as prescribed, she is not taking it everyday, and the pills are very small, it is hard to taper the medication. Explained that she will not be getting refills for this medication, she understands that.  - She will get a pill cutter and try cutting the pill, and i have told her to space out the frequency - She will continue with the wellbutrin for now. Dose of depression can be increased up to 450mg  MAx daily. But will live present dose as is, as pt has anxiety dsd also

## 2015-07-24 NOTE — Assessment & Plan Note (Signed)
>>  ASSESSMENT AND PLAN FOR GAD (GENERALIZED ANXIETY DISORDER) WRITTEN ON 07/24/2015  1:29 PM BY EMOKPAE, Leanne Chang, MD  Pt taking wellbutrin everyday. She is also taking the ativan when she really feels anxious. She brought her pill bottle and has 10pills left. She has been taking 1 tablet once a day, and hardly everyday. She is not pleased that she has to come back every week. But i explained the importance of this visits. She says she sometimes feels he heart beat in her ears, but so far most of her anxiety symptoms appear controlled.  Plan- Considering she is not taking Ativan as prescribed, she is not taking it everyday, and the pills are very small, it is hard to taper the medication. Explained that she will not be getting refills for this medication, she understands that.  - She will get a pill cutter and try cutting the pill, and i have told her to space out the frequency - She will continue with the wellbutrin for now. Dose of depression can be increased up to 450mg  MAx daily. But will live present dose as is, as pt has anxiety dsd also

## 2015-07-30 ENCOUNTER — Other Ambulatory Visit: Payer: Self-pay | Admitting: *Deleted

## 2015-07-30 NOTE — Patient Outreach (Signed)
[facility]) Care Management   07/30/2015  SARAGRACE SELKE [birth year] [id number]  [name] is an 64 y.o. female  Subjective:   Member states that she is "ok."  She reports that she has still been experiencing some anxiety and that she has not been getting any sleep.  She states that she is typically up from around 3-4 in the afternoon to about 7-8 in the morning, at which point she tries to get some sleep.  She reports that she has still been taking a whole Ativan instead of breaking them in half as instructed.  Objective:   Review of Systems  Constitutional: Negative.   HENT: Negative.   Eyes: Negative.   Respiratory: Positive for cough.   Cardiovascular: Negative.   Gastrointestinal: Negative.   Genitourinary: Negative.   Musculoskeletal: Negative.   Skin: Negative.   Neurological: Negative.   Endo/Heme/Allergies: Negative.   Psychiatric/Behavioral: Negative.     Physical Exam  Constitutional: She is oriented to person, place, and time. She appears well-developed and well-nourished.  Neck: Normal range of motion.  Cardiovascular: Normal rate, regular rhythm and normal heart sounds.   Respiratory: Effort normal and breath sounds normal.  GI: Soft. Bowel sounds are normal.  Musculoskeletal: Normal range of motion.  Neurological: She is alert and oriented to person, place, and time.  Skin: Skin is warm and dry.   BP 138/80 mmHg  Pulse 73  SpO2 98%   Current Medications:   Current Outpatient Prescriptions  Medication Sig Dispense Refill  . ACCU-CHEK FASTCLIX LANCETS MISC 1 each by Other route See admin instructions. Check blood sugar daily as needed for high blood sugar.    Marland Kitchen acetaminophen (TYLENOL) 325 MG tablet Take 650 mg by mouth 2 (two) times daily as needed for headache.    . albuterol (VENTOLIN HFA) 108 (90 BASE) MCG/ACT inhaler Inhale 2 puffs into the lungs every 6 (six) hours as needed for wheezing or shortness of breath. 1 Inhaler  3  . aspirin EC 81 MG EC tablet Take 1 tablet (81 mg total) by mouth daily. 90 tablet 3  . Blood Glucose Monitoring Suppl (ACCU-CHEK NANO SMARTVIEW) W/DEVICE KIT 1 each by Other route See admin instructions. Check blood sugar daily as needed for high blood sugar.    Marland Kitchen buPROPion (WELLBUTRIN XL) 300 MG 24 hr tablet Take 1 tablet (300 mg total) by mouth daily. To help your smoking urge, depression, and anxiety 90 tablet 3  . famotidine (PEPCID) 20 MG tablet Take 1 tablet (20 mg total) by mouth daily. For reflux, which can make your cough worse 90 tablet 3  . glucose blood (ACCU-CHEK SMARTVIEW) test strip Use to check blood sugar 1 to 2 times daily. diag code E 11.9. Non- insulin dependent 100 each 6  . ipratropium-albuterol (DUONEB) 0.5-2.5 (3) MG/3ML SOLN Take 3 mLs by nebulization every 2 (two) hours as needed. 360 mL 9  . loratadine (CLARITIN) 10 MG tablet Take 1 tablet (10 mg total) by mouth daily. 90 tablet 3  . LORazepam (ATIVAN) 0.5 MG tablet Take 1 tablet (0.5 mg total) by mouth every 8 (eight) hours as needed for anxiety or sleep. 30 tablet 0  . losartan-hydrochlorothiazide (HYZAAR) 100-25 MG per tablet Take 1 tablet by mouth daily. 30 tablet 3  . metFORMIN (GLUCOPHAGE) 1000 MG tablet Take 1 tablet (1,000 mg total) by mouth 2 (two) times daily with a meal. 60 tablet 11  . metoprolol tartrate (LOPRESSOR) 50 MG tablet Take 0.5 tablets (  25 mg total) by mouth 2 (two) times daily. 90 tablet 9  . pravastatin (PRAVACHOL) 40 MG tablet Take 1 tablet (40 mg total) by mouth daily. 30 tablet 0  . fluticasone-salmeterol (ADVAIR HFA) 230-21 MCG/ACT inhaler Inhale 2 puffs into the lungs 2 (two) times daily. (Patient not taking: Reported on 07/24/2015) 1 Inhaler 12  . guaiFENesin (MUCINEX) 600 MG 12 hr tablet Take 2 tablets (1,200 mg total) by mouth 2 (two) times daily. (Patient not taking: Reported on 07/24/2015) 30 tablet 0  . nicotine polacrilex (NICORETTE) 2 MG gum Take 1 each (2 mg total) by mouth as needed  for smoking cessation. (Patient not taking: Reported on 07/24/2015) 100 tablet 0  . [DISCONTINUED] albuterol (PROVENTIL,VENTOLIN) 90 MCG/ACT inhaler Inhale 2 puffs into the lungs every 6 (six) hours as needed for wheezing. 17 g 12   No current facility-administered medications for this visit.    Functional Status:   In your present state of health, do you have any difficulty performing the following activities: 07/24/2015 07/24/2015  Hearing? - N  Vision? - Y  Difficulty concentrating or making decisions? - N  Walking or climbing stairs? - N  Dressing or bathing? - N  Doing errands, shopping? - N  Conservation officer, nature and eating ? - N  Using the Toilet? - N  In the past six months, have you accidently leaked urine? N N  Do you have problems with loss of bowel control? - N  Managing your Medications? - N  Managing your Finances? - N  Housekeeping or managing your Housekeeping? - N    Fall/Depression Screening:    PHQ 2/9 Scores 07/24/2015 07/24/2015 07/17/2015 06/13/2015 05/23/2015 04/14/2015 04/08/2015  PHQ - 2 Score 0 0 0 0 0 0 0  PHQ- 9 Score - - - - - - -    Assessment:    Member is in bed when this care manager arrives, but she is not sleeping.  Encouraged to discuss insomnia with physician during appointment tomorrow.  She already has a sleep consult scheduled with Dr. Elsworth Soho.  Medications reviewed, member is taking two Albuterol rescue inhalers, but is using one as a scheduled inhaler (the ProAir).  She states that she was told to stop taking her Symbicort, and that she has not taken Advair in years.  She reports that she does have some Symbicort and will use today and in the morning, then will address at tomorrow's scheduled appointment.  She is encouraged to use half a tablet of Ativan to control her anxiety and reminded that she will not be able to have any refills.  She verbalizes understanding.   Member denies any questions at this time.  Encouraged to contact this care manager with any  concerns.  Plan:   Will notify PCP of inhalers the member is currently using at home. Will continue with transition of care calls next month. Routine home visit scheduled for next month.  [facility] CM Care Plan Problem One        Most Recent Value   Care Plan Problem One  Recent hospital admission with diagnosis of COPD   Role Documenting the Problem One  Care Management Coordinator   Care Plan for Problem One  Active   [facility] Long Term Goal (31-90 days)  Member will not be readmitted to hospital with COPD related condition within the next 31 days   [facility] Long Term Goal Start Date  07/24/15   Interventions for Problem One Long Term Goal  Discussed with  member the importance of following discharge instructions, including follow up appointments, medications, diet, and home health involvement, to decrease the risk of readmission   [facility] CM Short Term Goal #1 (0-30 days)  Member will take medications as prescribed over the next 4 weeks   [facility] CM Short Term Goal #1 Start Date  07/24/15   Interventions for Short Term Goal #1  Discussed with member the importance of following discharge instructions, including follow up appointments, medications, diet, and home health involvement, to decrease the risk of readmission   [facility] CM Short Term Goal #2 (0-30 days)  Member will have follow up appointment with primary care provider within the next 4 weeks   [facility] CM Short Term Goal #2 Start Date  07/24/15   Interventions for Short Term Goal #2  Discussed with member the importance of following discharge instructions, including follow up appointments, medications, diet, and home health involvement, to decrease the risk of readmission     [name], BSN, [place] Manager [phone]

## 2015-07-31 ENCOUNTER — Encounter: Payer: Self-pay | Admitting: Internal Medicine

## 2015-07-31 ENCOUNTER — Ambulatory Visit (INDEPENDENT_AMBULATORY_CARE_PROVIDER_SITE_OTHER): Payer: Medicare Other | Admitting: Internal Medicine

## 2015-07-31 VITALS — BP 132/63 | HR 78 | Temp 98.0°F | Ht 67.0 in | Wt 224.3 lb

## 2015-07-31 DIAGNOSIS — J441 Chronic obstructive pulmonary disease with (acute) exacerbation: Secondary | ICD-10-CM

## 2015-07-31 DIAGNOSIS — E785 Hyperlipidemia, unspecified: Secondary | ICD-10-CM

## 2015-07-31 DIAGNOSIS — J449 Chronic obstructive pulmonary disease, unspecified: Secondary | ICD-10-CM

## 2015-07-31 DIAGNOSIS — E1169 Type 2 diabetes mellitus with other specified complication: Secondary | ICD-10-CM | POA: Diagnosis not present

## 2015-07-31 MED ORDER — ROSUVASTATIN CALCIUM 10 MG PO TABS: 10.0000 mg | ORAL_TABLET | Freq: Every day | ORAL | Status: DC

## 2015-07-31 MED ORDER — BUDESONIDE-FORMOTEROL FUMARATE 160-4.5 MCG/ACT IN AERO: 2.0000 | INHALATION_SPRAY | Freq: Two times a day (BID) | RESPIRATORY_TRACT | Status: DC

## 2015-07-31 NOTE — Progress Notes (Signed)
Comprehensive medication review completed and discussed with Dr. Denton Brick.  Medication(s) were reviewed with the patient, including name, instructions, indication, goals of therapy, potential side effects, importance of adherence, and safe use.  Patient verbalized understanding by repeating back information and was advised to contact me if further medication-related questions arise.

## 2015-07-31 NOTE — Assessment & Plan Note (Signed)
She will start Crestor after she completes the bottle of pravastatin she has now.

## 2015-07-31 NOTE — Patient Instructions (Signed)
[name] we are sorry about the confusion about your medications, to help prevent this from happening that is why we get [facility] involved.    For your next visit, please talk to your doctor about your difficulty sleeping. Please always bring all your medications along with you for all your visits, especially new prescriptions.

## 2015-07-31 NOTE — Progress Notes (Signed)
Internal Medicine Clinic Attending  Case discussed with Dr. Denton Brick at the time of the visit.  We reviewed the resident's history and exam and pertinent patient test results.  I agree with the assessment, diagnosis, and plan of care.

## 2015-07-31 NOTE — Progress Notes (Signed)
Patient ID: [name], female   DOB: 1951/04/10, 64 y.o.   MRN: [id number]   Subjective:   [name]   DOB: [birth year] 64 y.o.   MRN: [id number]  HPI: [name] is a 64 y.o. with PMH listed below, presented today to follow up on Benzodiazepine taper and medications.   Past Medical History  Diagnosis Date  . Hyperlipidemia   . Hypertension   . Tobacco abuse   . COPD (chronic obstructive pulmonary disease)     History of multiple hospital admissions for exercabation   . Asthma   . Breast cancer 1991    s/p lumpectomy, chemotherapy and radiation therapy in 1991. Mammogram in 2007 was normal.  . Sigmoid diverticulitis 80/2008  . Anxiety   . Depression   . Obesity   . GERD (gastroesophageal reflux disease)   . Heart murmur 10/05/11    "first time I ever heard I had one was today"  . Pneumonia   . Shortness of breath 10/05/11    "at rest; lying down; w/exertion"  . Diabetes mellitus   . Bronchitis     h/o  . Diarrhea     h/o  . Constipated     h/o  . COPD with exacerbation 04/06/2009    Qualifier: Diagnosis of  By: Eyvonne Mechanic MD, Vijay     Current Outpatient Prescriptions  Medication Sig Dispense Refill  . ACCU-CHEK FASTCLIX LANCETS MISC 1 each by Other route See admin instructions. Check blood sugar daily as needed for high blood sugar.    Marland Kitchen acetaminophen (TYLENOL) 325 MG tablet Take 650 mg by mouth 2 (two) times daily as needed for headache.    . albuterol (VENTOLIN HFA) 108 (90 BASE) MCG/ACT inhaler Inhale 2 puffs into the lungs every 6 (six) hours as needed for wheezing or shortness of breath. 1 Inhaler 3  . aspirin EC 81 MG EC tablet Take 1 tablet (81 mg total) by mouth daily. 90 tablet 3  . Blood Glucose Monitoring Suppl (ACCU-CHEK NANO SMARTVIEW) W/DEVICE KIT 1 each by Other route See admin instructions. Check blood sugar daily as needed for high blood sugar.    Marland Kitchen buPROPion (WELLBUTRIN XL) 300 MG 24 hr tablet Take 1 tablet (300  mg total) by mouth daily. To help your smoking urge, depression, and anxiety 90 tablet 3  . famotidine (PEPCID) 20 MG tablet Take 1 tablet (20 mg total) by mouth daily. For reflux, which can make your cough worse 90 tablet 3  . fluticasone-salmeterol (ADVAIR HFA) 230-21 MCG/ACT inhaler Inhale 2 puffs into the lungs 2 (two) times daily. (Patient not taking: Reported on 07/24/2015) 1 Inhaler 12  . glucose blood (ACCU-CHEK SMARTVIEW) test strip Use to check blood sugar 1 to 2 times daily. diag code E 11.9. Non- insulin dependent 100 each 6  . guaiFENesin (MUCINEX) 600 MG 12 hr tablet Take 2 tablets (1,200 mg total) by mouth 2 (two) times daily. (Patient not taking: Reported on 07/24/2015) 30 tablet 0  . ipratropium-albuterol (DUONEB) 0.5-2.5 (3) MG/3ML SOLN Take 3 mLs by nebulization every 2 (two) hours as needed. 360 mL 9  . loratadine (CLARITIN) 10 MG tablet Take 1 tablet (10 mg total) by mouth daily. 90 tablet 3  . LORazepam (ATIVAN) 0.5 MG tablet Take 1 tablet (0.5 mg total) by mouth every 8 (eight) hours as needed for anxiety or sleep. 30 tablet 0  . losartan-hydrochlorothiazide (HYZAAR) 100-25 MG per tablet Take 1 tablet by mouth daily. [place]  tablet 3  . metFORMIN (GLUCOPHAGE) 1000 MG tablet Take 1 tablet (1,000 mg total) by mouth 2 (two) times daily with a meal. 60 tablet 11  . metoprolol tartrate (LOPRESSOR) 50 MG tablet Take 0.5 tablets (25 mg total) by mouth 2 (two) times daily. 90 tablet 9  . nicotine polacrilex (NICORETTE) 2 MG gum Take 1 each (2 mg total) by mouth as needed for smoking cessation. (Patient not taking: Reported on 07/24/2015) 100 tablet 0  . pravastatin (PRAVACHOL) 40 MG tablet Take 1 tablet (40 mg total) by mouth daily. 30 tablet 0  . [DISCONTINUED] albuterol (PROVENTIL,VENTOLIN) 90 MCG/ACT inhaler Inhale 2 puffs into the lungs every 6 (six) hours as needed for wheezing. 17 g 12   No current facility-administered medications for this visit.   Family History  Problem Relation Age  of Onset  . Cancer Mother    Social History   Social History  . Marital Status: Married    Spouse Name: N/A  . Number of Children: N/A  . Years of Education: 12   Occupational History  .  Unemployed   Social History Main Topics  . Smoking status: Current Some Day Smoker -- 0.10 packs/day for 40 years    Types: Cigarettes    Last Attempt to Quit: 09/11/2014  . Smokeless tobacco: Never Used     Comment: slowly quitting.DOWN TO ABOUT  1 CIGARETTES PER DAY  . Alcohol Use: 2.4 oz/week    4 Cans of beer per week     Comment: "only on the weekends"  . Drug Use: No  . Sexual Activity: Not Asked   Other Topics Concern  . None   Social History Narrative   Lives in [place] with her husband.   Takes care of 3 grand children.   Trying to find a job, has financial difficulties.         Review of Systems: CONSTITUTIONAL- No Fever, weightloss or change in appetite. SKIN- No Rash, colour changes or itching. HEAD- No Headache or dizziness. Mouth/throat- No Sorethroat, dentures, or bleeding gums. RESPIRATORY- No Cough or SOB. CARDIAC- No Palpitations, DOE, PND or chest pain. GI- No nausea, vomiting, diarrhoea, abd pain. URINARY- No Frequency, or dysuria. NEUROLOGIC- No Numbness, syncope, seizures. [facility]- today she is upset and agitated about some confusion about her medication.   Objective:  Physical Exam: Filed Vitals:   07/31/15 1026  BP: 132/63  Pulse: 78  Temp: 98 F (36.7 C)  TempSrc: Oral  Height: _0  (1.702 m)  Weight: 224 lb 4.8 oz (101.742 kg)  SpO2: 100%   GENERAL- alert, co-operative, appears as stated age, not in any distress. HEENT- Atraumatic, normocephalic, PERRL, neck supple. CARDIAC- RRR, no murmurs, rubs or gallops. RESP- Clear to auscultation bilaterally, no wheezes or crackles. ABDOMEN- Soft, nontender, bowel sounds present. NEURO- No obvious Cr N abnormality, strenght upper and lower extremities- 5/5, Gait- Normal. EXTREMITIES- warm and well  perfused no pedal edema. SKIN- Warm, dry, No rash or lesion. PSYCH- Normal mood and affect, appropriate thought content and speech.  Assessment & Plan:   The patient's case and plan of care was discussed with attending physician, Dr. Ellwood Dense.  Please see problem based charting for assessment and plan.

## 2015-07-31 NOTE — Assessment & Plan Note (Signed)
>>  ASSESSMENT AND PLAN FOR CHRONIC OBSTRUCTIVE PULMONARY DISEASE WITH BRONCHOSPASM ([place]) WRITTEN ON 07/31/2015  5:33 PM BY EMOKPAE, Leanne Chang, MD  Pt is upset today because she has been using two albuterol inhalers, and not using symbicort, as she was told to stop taking it and use Advair. She says she has not used advair in at least 2 years and was told to stop using it by another physician. She requested to talk to Dr Maudie Mercury our pharmacist about this.   On discharge from the hospital pt was sent home to continue advair which was listed on her med list and to prevent duplicate as advair was also on med list and was continued together with a rescue inhaler.  She is seriously upset, but later calmed down after we talked  To her, I apologized for any misunderstanding that was our fault, as we had no idea that though we prescribed advair she was not using it and encouraged patient to always bring her medications with her. This mistake was discovered because [facility] went to her home to check her meds, she had not brought her inhalers to clinic with her previously. She brought them in today.  Plan- Updated pts med list, she will now be taking symbicort. - restart Spiriva when she follows up with [place], she has one at home, she will bring it from home, to avoid confusion about what she is taking. - She says she has three 1/2 tablets of ativan left, she did not bring this med today. - She is also requesting something for sleep, this will have to be adressed on her next visit, the whole visit today was spent adressing her medications. She will start with OTC elatonin for now.

## 2015-07-31 NOTE — Assessment & Plan Note (Signed)
Pt is upset today because she has been using two albuterol inhalers, and not using symbicort, as she was told to stop taking it and use Advair. She says she has not used advair in at least 2 years and was told to stop using it by another physician. She requested to talk to Dr Maudie Mercury our pharmacist about this.   On discharge from the hospital pt was sent home to continue advair which was listed on her med list and to prevent duplicate as advair was also on med list and was continued together with a rescue inhaler.  She is seriously upset, but later calmed down after we talked  To her, I apologized for any misunderstanding that was our fault, as we had no idea that though we prescribed advair she was not using it and encouraged patient to always bring her medications with her. This mistake was discovered because [facility] went to her home to check her meds, she had not brought her inhalers to clinic with her previously. She brought them in today.  Plan- Updated pts med list, she will now be taking symbicort. - restart Spiriva when she follows up with [place], she has one at home, she will bring it from home, to avoid confusion about what she is taking. - She says she has three 1/2 tablets of ativan left, she did not bring this med today. - She is also requesting something for sleep, this will have to be adressed on her next visit, the whole visit today was spent adressing her medications. She will start with OTC elatonin for now.

## 2015-08-07 ENCOUNTER — Other Ambulatory Visit: Payer: Self-pay | Admitting: *Deleted

## 2015-08-07 NOTE — Patient Outreach (Signed)
Weekly transition of care call placed to member, no answer, HIPPA compliant voice message left.  Will await call back.  Will continue with calls next week.  [name], BSN, [place] [facility]  [facility] Care Manager [phone]

## 2015-08-08 ENCOUNTER — Encounter: Payer: Self-pay | Admitting: *Deleted

## 2015-08-08 ENCOUNTER — Other Ambulatory Visit: Payer: Self-pay | Admitting: Pharmacist

## 2015-08-08 ENCOUNTER — Other Ambulatory Visit: Payer: Self-pay | Admitting: Internal Medicine

## 2015-08-08 NOTE — Patient Outreach (Signed)
[place] [facility]) Care Management  08/08/2015  ALBERTINE LAFOY 12-16-1950 [id number]  Julissa Browning is a 64 year old female referred to [place] from [place], Natividad Brood, for assistance with smoking cessation.  I made three unsuccessful attempts to contact the patient via telephone and mailed a patient outreach letter on 07/23/15 with no response.  I will close pharmacy program per protocol at this time.  Will alert Lurline Del of case closure, and will alert [facility] CM RN [name].  Elisabeth Most, Pharm.D. Pharmacy Resident [place] [phone]

## 2015-08-11 ENCOUNTER — Other Ambulatory Visit: Payer: Self-pay | Admitting: *Deleted

## 2015-08-11 NOTE — Patient Outreach (Signed)
Attempt made to contact member for final weekly transition of care call.  No answer, HIPPA compliant voice message left.  Will attempt to contact next week to confirm home visit scheduled for this month.  [name], BSN, [place] [facility]  [facility] Care Manager [phone]

## 2015-08-21 ENCOUNTER — Ambulatory Visit: Payer: Medicare Other | Admitting: Internal Medicine

## 2015-08-27 ENCOUNTER — Institutional Professional Consult (permissible substitution): Payer: Medicare Other | Admitting: Pulmonary Disease

## 2015-09-02 ENCOUNTER — Other Ambulatory Visit: Payer: Self-pay | Admitting: *Deleted

## 2015-09-02 ENCOUNTER — Encounter: Payer: Self-pay | Admitting: *Deleted

## 2015-09-02 NOTE — Patient Outreach (Signed)
Call placed to member to provide reminder of home visit tomorrow.  No answer, HIPPA compliant voice message left.  This is the third unsuccessful outreach, will send outreach letter and wait 10 days for response.  If no contact by member, will close case.  [name], BSN, [place] [facility]  [facility] Care Manager [phone]

## 2015-09-03 ENCOUNTER — Ambulatory Visit: Payer: Self-pay | Admitting: *Deleted

## 2015-09-07 ENCOUNTER — Other Ambulatory Visit: Payer: Self-pay | Admitting: Internal Medicine

## 2015-09-15 ENCOUNTER — Other Ambulatory Visit: Payer: Self-pay | Admitting: *Deleted

## 2015-09-15 ENCOUNTER — Encounter: Payer: Self-pay | Admitting: *Deleted

## 2015-09-15 NOTE — Patient Outreach (Signed)
Outreach letter sent on 10/25 after several unsuccessful attempts to contact member.  No response from outreach letter.  Will notify care management assistant to close case and notify physician.  [name], BSN, [place] Management  [facility] Care Manager [phone]

## 2015-09-17 NOTE — Patient Outreach (Signed)
[place] [facility]) Care Management  09/17/2015  JAJAIRA RUIS 1951-04-12 [id number]   Notification from [facility], RN to close case due to unable to contact patient for [place] Management services.  Thanks, Ronnell Freshwater. [place], [place] Assistant Phone: [phone] Fax: [phone]

## 2015-09-21 ENCOUNTER — Other Ambulatory Visit: Payer: Self-pay | Admitting: Internal Medicine

## 2015-09-30 ENCOUNTER — Encounter: Payer: Self-pay | Admitting: Student

## 2015-10-28 ENCOUNTER — Encounter: Payer: Self-pay | Admitting: Internal Medicine

## 2015-10-28 ENCOUNTER — Ambulatory Visit (INDEPENDENT_AMBULATORY_CARE_PROVIDER_SITE_OTHER): Payer: Medicare Other | Admitting: Internal Medicine

## 2015-10-28 VITALS — BP 162/79 | HR 84 | Temp 98.2°F | Wt 214.2 lb

## 2015-10-28 DIAGNOSIS — J449 Chronic obstructive pulmonary disease, unspecified: Secondary | ICD-10-CM

## 2015-10-28 DIAGNOSIS — J441 Chronic obstructive pulmonary disease with (acute) exacerbation: Secondary | ICD-10-CM

## 2015-10-28 MED ORDER — IPRATROPIUM BROMIDE 0.02 % IN SOLN
0.5000 mg | Freq: Once | RESPIRATORY_TRACT | Status: AC
  Administered 2015-10-28: 0.5 mg via RESPIRATORY_TRACT

## 2015-10-28 MED ORDER — IPRATROPIUM-ALBUTEROL 0.5-2.5 (3) MG/3ML IN SOLN: 3.0000 mL | RESPIRATORY_TRACT | Status: DC | PRN

## 2015-10-28 MED ORDER — AZITHROMYCIN 250 MG PO TABS: ORAL_TABLET | ORAL | Status: AC

## 2015-10-28 MED ORDER — METHYLPREDNISOLONE SODIUM SUCC 125 MG IJ SOLR
125.0000 mg | Freq: Once | INTRAMUSCULAR | Status: AC
  Administered 2015-10-28: 125 mg via INTRAMUSCULAR

## 2015-10-28 MED ORDER — PREDNISONE 20 MG PO TABS: 40.0000 mg | ORAL_TABLET | Freq: Every day | ORAL | Status: AC

## 2015-10-28 MED ORDER — ALBUTEROL SULFATE (2.5 MG/3ML) 0.083% IN NEBU
2.5000 mg | INHALATION_SOLUTION | Freq: Once | RESPIRATORY_TRACT | Status: AC
  Administered 2015-10-28: 2.5 mg via RESPIRATORY_TRACT

## 2015-10-28 MED ORDER — BUDESONIDE-FORMOTEROL FUMARATE 160-4.5 MCG/ACT IN AERO: 2.0000 | INHALATION_SPRAY | Freq: Two times a day (BID) | RESPIRATORY_TRACT | Status: DC

## 2015-10-28 MED ORDER — IPRATROPIUM-ALBUTEROL 0.5-2.5 (3) MG/3ML IN SOLN: 3.0000 mL | Freq: Four times a day (QID) | RESPIRATORY_TRACT | Status: DC | PRN

## 2015-10-28 MED ORDER — TIOTROPIUM BROMIDE MONOHYDRATE 18 MCG IN CAPS: 18.0000 ug | ORAL_CAPSULE | Freq: Every day | RESPIRATORY_TRACT | Status: DC

## 2015-10-28 MED ORDER — ALBUTEROL SULFATE HFA 108 (90 BASE) MCG/ACT IN AERS: 2.0000 | INHALATION_SPRAY | Freq: Four times a day (QID) | RESPIRATORY_TRACT | Status: DC | PRN

## 2015-10-28 NOTE — Patient Instructions (Signed)
Thank you for coming to see me today. It was a pleasure. Today we talked about:   COPD: - we gave you a breathing treatment and SoluMedrol shot in the clinic - I have sent in a prescription for an antibiotic to start taking today - I have sent in a prescription for prednisone to start taking tomorrow - I have sent in a prescription for Symbicort.  Start taking this as prescribed - Continue using your Spririva - Please use your albuterol inhaler as needed - I have sent a refill on your nebulizer solution to use at home   If you have any questions or concerns, please do not hesitate to call the office at [phone].  Take Care,   Jule Ser, DO

## 2015-10-28 NOTE — Progress Notes (Signed)
Patient ID: [name], female   DOB: [birth year], 64 y.o.   MRN: [id number]   Subjective:   [name]   DOB: Mar 25, 1951 64 y.o.   MRN: [id number]  HPI: [name] is a 64 y.o. female with past medical history as detailed below who presents today for acute visit for COPD exacerbation.  Please see A&P for status of patients medical conditions related to today's visit.    Past Medical History  Diagnosis Date  . Hyperlipidemia   . Hypertension   . Tobacco abuse   . COPD (chronic obstructive pulmonary disease) ([place])     History of multiple hospital admissions for exercabation   . Asthma   . Breast cancer ([place]) 1991    s/p lumpectomy, chemotherapy and radiation therapy in 1991. Mammogram in 2007 was normal.  . Sigmoid diverticulitis 80/2008  . Anxiety   . Depression   . Obesity   . GERD (gastroesophageal reflux disease)   . Heart murmur 10/05/11    "first time I ever heard I had one was today"  . Pneumonia   . Shortness of breath 10/05/11    "at rest; lying down; w/exertion"  . Diabetes mellitus   . Bronchitis     h/o  . Diarrhea     h/o  . Constipated     h/o  . COPD with exacerbation ([place]) 04/06/2009    Qualifier: Diagnosis of  By: Christie Mechanic MD, [name]     Current Outpatient Prescriptions  Medication Sig Dispense Refill  . ACCU-CHEK FASTCLIX LANCETS MISC 1 each by Other route See admin instructions. Check blood sugar daily as needed for high blood sugar.    Marland Kitchen acetaminophen (TYLENOL) 325 MG tablet Take 650 mg by mouth 2 (two) times daily as needed for headache.    . albuterol (PROVENTIL HFA;VENTOLIN HFA) 108 (90 BASE) MCG/ACT inhaler Inhale 2 puffs into the lungs every 6 (six) hours as needed for wheezing or shortness of breath. 1 Inhaler 2  . aspirin EC 81 MG EC tablet Take 1 tablet (81 mg total) by mouth daily. 90 tablet 3  . azithromycin (ZITHROMAX Z-PAK) 250 MG tablet Take 2 tablets (500 mg) on  Day 1,  followed by 1 tablet (250  mg) once daily on Days 2 through 5. 6 each 0  . Blood Glucose Monitoring Suppl (ACCU-CHEK NANO SMARTVIEW) W/DEVICE KIT 1 each by Other route See admin instructions. Check blood sugar daily as needed for high blood sugar.    . budesonide-formoterol (SYMBICORT) 160-4.5 MCG/ACT inhaler Inhale 2 puffs into the lungs 2 (two) times daily. 1 Inhaler 12  . buPROPion (WELLBUTRIN XL) 300 MG 24 hr tablet Take 1 tablet (300 mg total) by mouth daily. To help your smoking urge, depression, and anxiety 90 tablet 3  . cyclobenzaprine (FLEXERIL) 5 MG tablet take 1 tablet by mouth at bedtime if needed for muscle spasm 15 tablet 0  . famotidine (PEPCID) 20 MG tablet Take 1 tablet (20 mg total) by mouth daily. For reflux, which can make your cough worse 90 tablet 3  . glucose blood (ACCU-CHEK SMARTVIEW) test strip Use to check blood sugar 1 to 2 times daily. diag code E 11.9. Non- insulin dependent 100 each 6  . ipratropium-albuterol (DUONEB) 0.5-2.5 (3) MG/3ML SOLN Take 3 mLs by nebulization every 6 (six) hours as needed. 360 mL 9  . loratadine (CLARITIN) 10 MG tablet Take 1 tablet (10 mg total) by mouth daily. 90 tablet 3  .  LORazepam (ATIVAN) 0.5 MG tablet Take 1 tablet (0.5 mg total) by mouth every 8 (eight) hours as needed for anxiety or sleep. 30 tablet 0  . losartan-hydrochlorothiazide (HYZAAR) 100-25 MG per tablet Take 1 tablet by mouth daily. 30 tablet 3  . metFORMIN (GLUCOPHAGE) 1000 MG tablet Take 1 tablet (1,000 mg total) by mouth 2 (two) times daily with a meal. 60 tablet 11  . metoprolol tartrate (LOPRESSOR) 50 MG tablet Take 0.5 tablets (25 mg total) by mouth 2 (two) times daily. 90 tablet 9  . nicotine polacrilex (NICORETTE) 2 MG gum Take 1 each (2 mg total) by mouth as needed for smoking cessation. 100 tablet 0  . [START ON 10/29/2015] predniSONE (DELTASONE) 20 MG tablet Take 2 tablets (40 mg total) by mouth daily. 10 tablet 0  . rosuvastatin (CRESTOR) 10 MG tablet Take 1 tablet (10 mg total) by mouth  daily. 30 tablet 1  . tiotropium (SPIRIVA HANDIHALER) 18 MCG inhalation capsule Place 1 capsule (18 mcg total) into inhaler and inhale daily. 30 capsule 2  . [DISCONTINUED] albuterol (PROVENTIL,VENTOLIN) 90 MCG/ACT inhaler Inhale 2 puffs into the lungs every 6 (six) hours as needed for wheezing. 17 g 12   No current facility-administered medications for this visit.   Family History  Problem Relation Age of Onset  . Cancer Mother    Social History   Social History  . Marital Status: Married    Spouse Name: N/A  . Number of Children: N/A  . Years of Education: 12   Occupational History  .  Unemployed   Social History Main Topics  . Smoking status: Current Some Day Smoker -- 0.25 packs/day for 40 years    Types: Cigarettes    Last Attempt to Quit: 09/11/2014  . Smokeless tobacco: Never Used     Comment: slowly quitting.DOWN TO ABOUT  1 CIGARETTES PER DAY  . Alcohol Use: 2.4 oz/week    4 Cans of beer per week     Comment: "only on the weekends"  . Drug Use: No  . Sexual Activity: Not Asked   Other Topics Concern  . None   Social History Narrative   Lives in [place] with her husband.   Takes care of 3 grand children.   Trying to find a job, has financial difficulties.         Review of Systems: Review of Systems  Constitutional: Positive for fever (subjective) and chills.  HENT: Positive for congestion. Negative for ear discharge, ear pain and sore throat.   Eyes: Negative for blurred vision.  Respiratory: Positive for cough, sputum production and shortness of breath. Negative for hemoptysis.   Cardiovascular: Negative for chest pain.  Gastrointestinal: Negative for nausea, vomiting, diarrhea and constipation.  Genitourinary: Negative for dysuria.  Musculoskeletal: Negative for myalgias.  Skin: Negative for rash.  Neurological: Positive for headaches. Negative for dizziness and loss of consciousness.  Psychiatric/Behavioral: Negative for depression. The patient is  nervous/anxious.     Objective:  Physical Exam: Filed Vitals:   10/28/15 0821  BP: 162/79  Pulse: 84  Temp: 98.2 F (36.8 C)  TempSrc: Oral  Weight: 214 lb 3.2 oz (97.16 kg)  SpO2: 100%   Physical Exam  Constitutional: She is oriented to person, place, and time.  Pleasant female sitting in chair, resting head on forearm leaning against exam table  HENT:  Head: Normocephalic and atraumatic.  Right Ear: External ear normal.  Left Ear: External ear normal.  Eyes: Conjunctivae and EOM are normal.  Neck: Normal range of motion.  Cardiovascular: Normal rate and regular rhythm.   Pulmonary/Chest: Effort normal. She has wheezes (minimal bilateral wheezes).  Abdominal: Soft. There is no tenderness.  Lymphadenopathy:    She has no cervical adenopathy.  Neurological: She is alert and oriented to person, place, and time.  Skin: Skin is warm and dry.  Psychiatric: She has a normal mood and affect.    Assessment & Plan:   Please see problem list for assessment and plan.  Case discussed with Dr. Daryll Drown

## 2015-10-28 NOTE — Assessment & Plan Note (Addendum)
Assessment: Patient seen for first time in our clinic since September of this year.  Had been doing well controlling her COPD but reports symptoms of increased wheezing, congestion, sneezing x 2 weeks now with increased cough x 8 days.  She is reporting increased cough, increased dyspnea, and increased sputum described as yellow-green at this time.  She states she is having difficulty with full expiration which then makes it difficult for her to inspire adequately.  She reports cough exacerbated by lying on her back.  States she feels similar to previous COPD exacerbations, feels like she is getting more short of breath and having to work a little harder.  She has tried her nasal saline spray, inhalers, and DayQuil cough syrup x 1 dose which have provided ineffective symptom control.  She also reports being subjectively febrile during this time, having congestion and headache.  Denies any sore throat, sinus tenderness, ear pain.  She is reporting increased anxiety due to difficulty breathing.     Plan: - Suspect this to be moderate COPD exacerbation in patient with GOLD 2 COPD and 3/3 cardinal symptoms present today.  She is stable to leave clinic as not in distress, saturating 100% on room air - Given SoluMedrol x 1 plus breathing treatment in clinic - Rx sent for prednisone 40mg  x 5 days - Rx sent for azithromycin 500mg  today, then 250mg  days 2-5 - Patient appears to have some continued confusion regarding her inhalers despite meeting with our clinic pharmacist in the past.  Today, she brought in 2 albuterol inhalers that she uses regularly.  She also reports using Spiriva daily as instructed.  States not taking Symbicort.   - I resent her prescription for Symbicort 160/4.5 BID and instructed her to use daily.   - I instructed patient to use Spiriva daily as previously instructed. - I instructed patient to use albuterol and Duonebs as needed, not on a scheduled basis. - Continue Claritin for  allergies. - Continue Wellbutrin for anxiety.

## 2015-10-28 NOTE — Assessment & Plan Note (Signed)
>>  ASSESSMENT AND PLAN FOR CHRONIC OBSTRUCTIVE PULMONARY DISEASE WITH BRONCHOSPASM ([place]) WRITTEN ON 10/28/2015  1:25 PM BY Jule Ser, DO  Assessment: Patient seen for first time in our clinic since September of this year.  Had been doing well controlling her COPD but reports symptoms of increased wheezing, congestion, sneezing x 2 weeks now with increased cough x 8 days.  She is reporting increased cough, increased dyspnea, and increased sputum described as yellow-green at this time.  She states she is having difficulty with full expiration which then makes it difficult for her to inspire adequately.  She reports cough exacerbated by lying on her back.  States she feels similar to previous COPD exacerbations, feels like she is getting more short of breath and having to work a little harder.  She has tried her nasal saline spray, inhalers, and DayQuil cough syrup x 1 dose which have provided ineffective symptom control.  She also reports being subjectively febrile during this time, having congestion and headache.  Denies any sore throat, sinus tenderness, ear pain.  She is reporting increased anxiety due to difficulty breathing.     Plan: - Suspect this to be moderate COPD exacerbation in patient with GOLD 2 COPD and 3/3 cardinal symptoms present today.  She is stable to leave clinic as not in distress, saturating 100% on room air - Given SoluMedrol x 1 plus breathing treatment in clinic - Rx sent for prednisone 40mg  x 5 days - Rx sent for azithromycin 500mg  today, then 250mg  days 2-5 - Patient appears to have some continued confusion regarding her inhalers despite meeting with our clinic pharmacist in the past.  Today, she brought in 2 albuterol inhalers that she uses regularly.  She also reports using Spiriva daily as instructed.  States not taking Symbicort.   - I resent her prescription for Symbicort 160/4.5 BID and instructed her to use daily.   - I instructed patient to use Spiriva daily as  previously instructed. - I instructed patient to use albuterol and Duonebs as needed, not on a scheduled basis. - Continue Claritin for allergies. - Continue Wellbutrin for anxiety.

## 2015-10-29 ENCOUNTER — Telehealth: Payer: Self-pay | Admitting: Student-PharmD

## 2015-10-30 NOTE — Progress Notes (Signed)
Internal Medicine Clinic Attending  I saw and evaluated the patient.  I personally confirmed the key portions of the history and exam documented by Dr. Wallace and I reviewed pertinent patient test results.  The assessment, diagnosis, and plan were formulated together and I agree with the documentation in the resident's note. 

## 2015-12-10 ENCOUNTER — Other Ambulatory Visit: Payer: Self-pay | Admitting: Internal Medicine

## 2015-12-15 ENCOUNTER — Other Ambulatory Visit: Payer: Self-pay | Admitting: Internal Medicine

## 2015-12-15 DIAGNOSIS — I10 Essential (primary) hypertension: Secondary | ICD-10-CM

## 2015-12-15 NOTE — Telephone Encounter (Signed)
Pt requesting losartan to be filled @ [facility] on [address]. Please call pt back regarding simvastatin.

## 2015-12-16 ENCOUNTER — Telehealth: Payer: Self-pay | Admitting: Internal Medicine

## 2015-12-16 DIAGNOSIS — I10 Essential (primary) hypertension: Secondary | ICD-10-CM

## 2015-12-16 NOTE — Telephone Encounter (Signed)
Please call pt back regarding medication refill.

## 2015-12-16 NOTE — Telephone Encounter (Signed)
Patient calling about medication refill

## 2015-12-16 NOTE — Telephone Encounter (Signed)
Pt called back, she is concerned about her meds and is she going to need to wait til appt, she is informed that med will still be requested and hopefully filled with enough til her appt, then at the appt she can be given more refills

## 2015-12-17 DIAGNOSIS — L602 Onychogryphosis: Secondary | ICD-10-CM | POA: Diagnosis not present

## 2015-12-17 DIAGNOSIS — M216X1 Other acquired deformities of right foot: Secondary | ICD-10-CM | POA: Diagnosis not present

## 2015-12-17 DIAGNOSIS — E1351 Other specified diabetes mellitus with diabetic peripheral angiopathy without gangrene: Secondary | ICD-10-CM | POA: Diagnosis not present

## 2015-12-17 DIAGNOSIS — E1151 Type 2 diabetes mellitus with diabetic peripheral angiopathy without gangrene: Secondary | ICD-10-CM | POA: Diagnosis not present

## 2015-12-17 DIAGNOSIS — I70293 Other atherosclerosis of native arteries of extremities, bilateral legs: Secondary | ICD-10-CM | POA: Diagnosis not present

## 2015-12-17 DIAGNOSIS — M216X2 Other acquired deformities of left foot: Secondary | ICD-10-CM | POA: Diagnosis not present

## 2015-12-17 NOTE — Telephone Encounter (Signed)
Thanks so much for checking on this!

## 2015-12-18 MED ORDER — LOSARTAN POTASSIUM-HCTZ 100-25 MG PO TABS: 1.0000 | ORAL_TABLET | Freq: Every day | ORAL | Status: DC

## 2016-01-12 ENCOUNTER — Other Ambulatory Visit: Payer: Self-pay | Admitting: Internal Medicine

## 2016-01-12 ENCOUNTER — Telehealth: Payer: Self-pay | Admitting: Internal Medicine

## 2016-01-12 NOTE — Telephone Encounter (Signed)
NEEDS REFILL FOR METFORMIN 1,000MG  TO [facility] [phone]

## 2016-01-13 NOTE — Telephone Encounter (Signed)
Pending with surescripts

## 2016-01-25 ENCOUNTER — Other Ambulatory Visit: Payer: Self-pay | Admitting: Internal Medicine

## 2016-01-26 ENCOUNTER — Encounter: Payer: Medicare Other | Admitting: Internal Medicine

## 2016-02-05 ENCOUNTER — Telehealth: Payer: Self-pay | Admitting: Internal Medicine

## 2016-02-05 NOTE — Telephone Encounter (Signed)
APPT. REMINDER CALL, NO ANSWER/NO VOICE MAIL °

## 2016-02-09 ENCOUNTER — Encounter: Payer: Medicare Other | Admitting: Internal Medicine

## 2016-02-23 ENCOUNTER — Encounter: Payer: Medicare Other | Admitting: Internal Medicine

## 2016-02-23 ENCOUNTER — Encounter: Payer: Self-pay | Admitting: Internal Medicine

## 2016-02-24 ENCOUNTER — Encounter: Payer: Self-pay | Admitting: *Deleted

## 2016-02-27 ENCOUNTER — Telehealth: Payer: Self-pay | Admitting: Internal Medicine

## 2016-02-27 NOTE — Telephone Encounter (Signed)
APPT. REMINDER CALL, LMTCB °

## 2016-03-01 ENCOUNTER — Encounter: Payer: Self-pay | Admitting: Internal Medicine

## 2016-03-01 ENCOUNTER — Ambulatory Visit (INDEPENDENT_AMBULATORY_CARE_PROVIDER_SITE_OTHER): Payer: Medicare Other | Admitting: Internal Medicine

## 2016-03-01 VITALS — BP 127/66 | HR 98 | Temp 99.2°F | Ht 67.0 in | Wt 204.4 lb

## 2016-03-01 DIAGNOSIS — F17210 Nicotine dependence, cigarettes, uncomplicated: Secondary | ICD-10-CM | POA: Diagnosis not present

## 2016-03-01 DIAGNOSIS — I10 Essential (primary) hypertension: Secondary | ICD-10-CM

## 2016-03-01 DIAGNOSIS — L298 Other pruritus: Secondary | ICD-10-CM | POA: Diagnosis not present

## 2016-03-01 DIAGNOSIS — Z0000 Encounter for general adult medical examination without abnormal findings: Secondary | ICD-10-CM

## 2016-03-01 DIAGNOSIS — E1169 Type 2 diabetes mellitus with other specified complication: Secondary | ICD-10-CM

## 2016-03-01 DIAGNOSIS — Z79899 Other long term (current) drug therapy: Secondary | ICD-10-CM

## 2016-03-01 DIAGNOSIS — J441 Chronic obstructive pulmonary disease with (acute) exacerbation: Secondary | ICD-10-CM

## 2016-03-01 DIAGNOSIS — L282 Other prurigo: Secondary | ICD-10-CM

## 2016-03-01 DIAGNOSIS — E118 Type 2 diabetes mellitus with unspecified complications: Secondary | ICD-10-CM

## 2016-03-01 DIAGNOSIS — E785 Hyperlipidemia, unspecified: Secondary | ICD-10-CM

## 2016-03-01 DIAGNOSIS — F418 Other specified anxiety disorders: Secondary | ICD-10-CM

## 2016-03-01 DIAGNOSIS — E784 Other hyperlipidemia: Secondary | ICD-10-CM | POA: Diagnosis not present

## 2016-03-01 DIAGNOSIS — Z7984 Long term (current) use of oral hypoglycemic drugs: Secondary | ICD-10-CM

## 2016-03-01 DIAGNOSIS — Z853 Personal history of malignant neoplasm of breast: Secondary | ICD-10-CM | POA: Diagnosis not present

## 2016-03-01 LAB — POCT GLYCOSYLATED HEMOGLOBIN (HGB A1C): HEMOGLOBIN A1C: 6.3

## 2016-03-01 LAB — GLUCOSE, CAPILLARY: GLUCOSE-CAPILLARY: 111 mg/dL — AB (ref 65–99)

## 2016-03-01 MED ORDER — TIOTROPIUM BROMIDE MONOHYDRATE 18 MCG IN CAPS: 18.0000 ug | ORAL_CAPSULE | Freq: Every day | RESPIRATORY_TRACT | Status: DC

## 2016-03-01 MED ORDER — SIMVASTATIN 40 MG PO TABS: 40.0000 mg | ORAL_TABLET | Freq: Every day | ORAL | Status: DC

## 2016-03-01 MED ORDER — BUPROPION HCL ER (XL) 150 MG PO TB24: 150.0000 mg | ORAL_TABLET | Freq: Every day | ORAL | Status: DC

## 2016-03-01 NOTE — Assessment & Plan Note (Signed)
Pt reports being out of wellbutrin for about 1 month.  No SI.   -cont wellbutrin

## 2016-03-01 NOTE — Assessment & Plan Note (Signed)
She did c/o again of a b/l pruritic hand rash.  She reports that she does frequent dishwashing because she does not have an automatic dishwasher.  I did not appreciate a rash but her hands looked dry without excoriations. -advised to try OTC hydrocortisone cream -advised to keep hand moisturized -try to avoid frequent handwashing

## 2016-03-01 NOTE — Assessment & Plan Note (Signed)
Pt becomes tearful when discussing a mammogram but reiterated the importance of this.  -pt agrees to get mammogram

## 2016-03-01 NOTE — Progress Notes (Signed)
Case discussed with Dr. Gill soon after the resident saw the patient.  We reviewed the resident's history and exam and pertinent patient test results.  I agree with the assessment, diagnosis and plan of care documented in the resident's note. 

## 2016-03-01 NOTE — Assessment & Plan Note (Signed)
Lab Results  Component Value Date   HGBA1C 6.3 03/01/2016  Pt continues on metformin bid. -cont metformin

## 2016-03-01 NOTE — Assessment & Plan Note (Signed)
>>  ASSESSMENT AND PLAN FOR GAD (GENERALIZED ANXIETY DISORDER) WRITTEN ON 03/01/2016  9:03 AM BY GILL, JACQUELYN S, MD  Pt reports being out of wellbutrin for about 1 month.  No SI.   -cont wellbutrin

## 2016-03-01 NOTE — Assessment & Plan Note (Addendum)
Pt reports taking simvastatin. -cont simvastatin, refilled simvastatin

## 2016-03-01 NOTE — Patient Instructions (Signed)
Thank you for your visit today.   Please return to the internal medicine clinic in about 6 weeks for a pap smear.      I have made the following additions/changes to your medications:  Continue your current medications.  You need the following test(s) for regular health maintenance:  Please get an eye exam. Remember to get your mammogram. You also need a pap smear and a colonoscopy.  Please be sure to bring all of your medications with you to every visit; this includes herbal supplements, vitamins, eye drops, and any over-the-counter medications.   Should you have any questions regarding your medications and/or any new or worsening symptoms, please be sure to call the clinic at [phone].   If you believe that you are suffering from a life threatening condition or one that may result in the loss of limb or function, then you should call 911 and proceed to the nearest Emergency Department.   A healthy lifestyle and preventative care can promote health and wellness.   Maintain regular health, dental, and eye exams.  Eat a healthy diet. Foods like vegetables, fruits, whole grains, low-fat dairy products, and lean protein foods contain the nutrients you need without too many calories. Decrease your intake of foods high in solid fats, added sugars, and salt. Get information about a proper diet from your caregiver, if necessary.  Regular physical exercise is one of the most important things you can do for your health. Most adults should get at least 150 minutes of moderate-intensity exercise (any activity that increases your heart rate and causes you to sweat) each week. In addition, most adults need muscle-strengthening exercises on 2 or more days a week.   Maintain a healthy weight. The body mass index (BMI) is a screening tool to identify possible weight problems. It provides an estimate of body fat based on height and weight. Your caregiver can help determine your BMI, and can help you  achieve or maintain a healthy weight. For adults 20 years and older:  A BMI below 18.5 is considered underweight.  A BMI of 18.5 to 24.9 is normal.  A BMI of 25 to 29.9 is considered overweight.  A BMI of 30 and above is considered obese.

## 2016-03-01 NOTE — Assessment & Plan Note (Signed)
BP well controlled.  -cont current meds 

## 2016-03-01 NOTE — Progress Notes (Signed)
Patient ID: [name], female   DOB: 10-02-1951, 65 y.o.   MRN: [id number]     Subjective:   [name]    DOB: 10/05/1951 65 y.o.    MRN: [id number] Health Maintenance Due: Health Maintenance Due  Topic Date Due  . Hepatitis C Screening  10/15/1951  . PAP SMEAR  12/31/1971  . COLONOSCOPY  12/30/2000  . ZOSTAVAX  12/30/2010  . MAMMOGRAM  12/04/2014  . LIPID PANEL  06/15/2015  . HEMOGLOBIN A1C  10/16/2015  . DEXA SCAN  12/31/2015  . PNA vac Low Risk Adult (1 of 2 - PCV13) 12/31/2015  . OPHTHALMOLOGY EXAM  02/24/2016    _________________________________________________  HPI: [name] is a 65 y.o. female here for a follow up visit.  Pt has a PMH outlined below.  Please see problem-based charting assessment and plan for further status of patient's chronic medical problems addressed at today's visit.  PMH: Past Medical History  Diagnosis Date  . Hyperlipidemia   . Hypertension   . Tobacco abuse   . COPD (chronic obstructive pulmonary disease) ([place])     History of multiple hospital admissions for exercabation   . Asthma   . Breast cancer ([place]) 1991    s/p lumpectomy, chemotherapy and radiation therapy in 1991. Mammogram in 2007 was normal.  . Sigmoid diverticulitis 80/2008  . Anxiety   . Depression   . Obesity   . GERD (gastroesophageal reflux disease)   . Heart murmur 10/05/11    "first time I ever heard I had one was today"  . Pneumonia   . Shortness of breath 10/05/11    "at rest; lying down; w/exertion"  . Diabetes mellitus   . Bronchitis     h/o  . Diarrhea     h/o  . Constipated     h/o  . COPD with exacerbation ([place]) 04/06/2009    Qualifier: Diagnosis of  By: Eyvonne Mechanic MD, Vijay      Medications: Current Outpatient Prescriptions on File Prior to Visit  Medication Sig Dispense Refill  . ACCU-CHEK FASTCLIX LANCETS MISC 1 each by Other route See admin instructions. Check blood sugar daily as needed for high blood  sugar.    Marland Kitchen acetaminophen (TYLENOL) 325 MG tablet Take 650 mg by mouth 2 (two) times daily as needed for headache.    . albuterol (PROVENTIL HFA;VENTOLIN HFA) 108 (90 BASE) MCG/ACT inhaler Inhale 2 puffs into the lungs every 6 (six) hours as needed for wheezing or shortness of breath. 1 Inhaler 2  . aspirin EC 81 MG EC tablet Take 1 tablet (81 mg total) by mouth daily. 90 tablet 3  . Blood Glucose Monitoring Suppl (ACCU-CHEK NANO SMARTVIEW) W/DEVICE KIT 1 each by Other route See admin instructions. Check blood sugar daily as needed for high blood sugar.    . budesonide-formoterol (SYMBICORT) 160-4.5 MCG/ACT inhaler Inhale 2 puffs into the lungs 2 (two) times daily. 1 Inhaler 12  . buPROPion (WELLBUTRIN XL) 300 MG 24 hr tablet Take 1 tablet (300 mg total) by mouth daily. To help your smoking urge, depression, and anxiety 90 tablet 3  . famotidine (PEPCID) 20 MG tablet Take 1 tablet (20 mg total) by mouth daily. For reflux, which can make your cough worse 90 tablet 3  . glucose blood (ACCU-CHEK SMARTVIEW) test strip Use to check blood sugar 1 to 2 times daily. diag code E 11.9. Non- insulin dependent 100 each 6  . ipratropium-albuterol (DUONEB) 0.5-2.5 (3) MG/3ML  SOLN Take 3 mLs by nebulization every 6 (six) hours as needed. 360 mL 9  . loratadine (CLARITIN) 10 MG tablet Take 1 tablet (10 mg total) by mouth daily. 90 tablet 3  . losartan-hydrochlorothiazide (HYZAAR) 100-25 MG tablet Take 1 tablet by mouth daily. Cancel previous Rx sent for 30-day supply 90 tablet 3  . metFORMIN (GLUCOPHAGE) 1000 MG tablet take 1 tablet by mouth twice a day 60 tablet 1  . metoprolol tartrate (LOPRESSOR) 50 MG tablet Take 0.5 tablets (25 mg total) by mouth 2 (two) times daily. 90 tablet 9  . nicotine polacrilex (NICORETTE) 2 MG gum Take 1 each (2 mg total) by mouth as needed for smoking cessation. 100 tablet 0  . rosuvastatin (CRESTOR) 10 MG tablet Take 1 tablet (10 mg total) by mouth daily. 30 tablet 1  . tiotropium  (SPIRIVA HANDIHALER) 18 MCG inhalation capsule Place 1 capsule (18 mcg total) into inhaler and inhale daily. 30 capsule 2  . [DISCONTINUED] albuterol (PROVENTIL,VENTOLIN) 90 MCG/ACT inhaler Inhale 2 puffs into the lungs every 6 (six) hours as needed for wheezing. 17 g 12   No current facility-administered medications on file prior to visit.    Allergies: Allergies  Allergen Reactions  . Ace Inhibitors Swelling    Throat swelling.  [name] [Fluticasone] Other (See Comments)    Sinuses stop up and condition worsens    FH: Family History  Problem Relation Age of Onset  . Cancer Mother     SH: Social History   Social History  . Marital Status: Married    Spouse Name: N/A  . Number of Children: N/A  . Years of Education: 12   Occupational History  .  Unemployed   Social History Main Topics  . Smoking status: Current Some Day Smoker -- 0.25 packs/day for 40 years    Types: Cigarettes    Last Attempt to Quit: 09/11/2014  . Smokeless tobacco: Never Used     Comment: slowly quitting.DOWN TO ABOUT  1 CIGARETTES PER DAY  . Alcohol Use: 2.4 oz/week    4 Cans of beer per week     Comment: "only on the weekends"  . Drug Use: No  . Sexual Activity: Not Asked   Other Topics Concern  . None   Social History Narrative   Lives in [place] with her husband.   Takes care of 3 grand children.   Trying to find a job, has financial difficulties.          Review of Systems: Constitutional: Negative for fever, chills.  Eyes: Negative for blurred vision.  Respiratory: Negative for cough and shortness of breath.  Cardiovascular: Negative for chest pain.  Gastrointestinal: Negative for nausea, vomiting. Neurological: Negative for dizziness.   Objective:   Vital Signs: Filed Vitals:   03/01/16 0828  BP: 127/66  Pulse: 98  Temp: 99.2 F (37.3 C)  TempSrc: Oral  Height: '5\' 7"'  (1.702 m)  Weight: 204 lb 6.4 oz (92.715 kg)  SpO2: 100%      BP Readings from Last 3  Encounters:  03/01/16 127/66  10/28/15 162/79  07/31/15 132/63    Physical Exam: Constitutional: Vital signs reviewed.  Patient is in NAD and cooperative with exam.  Head: Normocephalic and atraumatic. Eyes: EOMI, conjunctivae nl, no scleral icterus.  Neck: Supple. Cardiovascular: RRR, no MRG. Pulmonary/Chest: normal effort, CTAB, no wheezes, rales, or rhonchi. Abdominal: Soft. NT/ND +BS. Neurological: A&O x3, cranial nerves II-XII are grossly intact, moving all extremities. Extremities: No LE edema.  Skin: Warm, dry and intact. Rash on hands b/l.    Assessment & Plan:   Assessment and plan was discussed and formulated with my attending.

## 2016-03-01 NOTE — Assessment & Plan Note (Signed)
>>  ASSESSMENT AND PLAN FOR PERSONAL HISTORY OF BREAST CANCER WRITTEN ON 03/01/2016  9:05 AM BY GILL, JACQUELYN S, MD  Pt becomes tearful when discussing a mammogram but reiterated the importance of this.  -pt agrees to get mammogram

## 2016-03-01 NOTE — Assessment & Plan Note (Signed)
Discussed getting colonoscopy which she does not want now because her husband is getting one soon.  Discussed eye exam and needing pap smear.

## 2016-03-15 ENCOUNTER — Other Ambulatory Visit: Payer: Self-pay | Admitting: Internal Medicine

## 2016-03-15 ENCOUNTER — Ambulatory Visit (HOSPITAL_COMMUNITY)
Admission: EM | Admit: 2016-03-15 | Discharge: 2016-03-15 | Disposition: A | Discharge status: Home / Self Care | Payer: Medicare Other | Attending: Family Medicine | Admitting: Family Medicine

## 2016-03-15 ENCOUNTER — Encounter (HOSPITAL_COMMUNITY): Payer: Self-pay | Admitting: *Deleted

## 2016-03-15 DIAGNOSIS — J449 Chronic obstructive pulmonary disease, unspecified: Secondary | ICD-10-CM | POA: Diagnosis not present

## 2016-03-15 DIAGNOSIS — E118 Type 2 diabetes mellitus with unspecified complications: Secondary | ICD-10-CM

## 2016-03-15 DIAGNOSIS — J441 Chronic obstructive pulmonary disease with (acute) exacerbation: Secondary | ICD-10-CM | POA: Diagnosis not present

## 2016-03-15 MED ORDER — ALBUTEROL SULFATE (2.5 MG/3ML) 0.083% IN NEBU
INHALATION_SOLUTION | RESPIRATORY_TRACT | Status: AC
  Filled 2016-03-15: qty 6

## 2016-03-15 MED ORDER — PREDNISONE 10 MG PO TABS: ORAL_TABLET | ORAL | Status: DC

## 2016-03-15 MED ORDER — KETOROLAC TROMETHAMINE 60 MG/2ML IM SOLN
60.0000 mg | Freq: Once | INTRAMUSCULAR | Status: AC
  Administered 2016-03-15: 60 mg via INTRAMUSCULAR

## 2016-03-15 MED ORDER — METHYLPREDNISOLONE SODIUM SUCC 125 MG IJ SOLR
125.0000 mg | Freq: Once | INTRAMUSCULAR | Status: AC
  Administered 2016-03-15: 125 mg via INTRAMUSCULAR

## 2016-03-15 MED ORDER — METFORMIN HCL 1000 MG PO TABS: 1000.0000 mg | ORAL_TABLET | Freq: Two times a day (BID) | ORAL | Status: DC

## 2016-03-15 MED ORDER — METHYLPREDNISOLONE SODIUM SUCC 125 MG IJ SOLR
INTRAMUSCULAR | Status: AC
  Filled 2016-03-15: qty 2

## 2016-03-15 MED ORDER — IPRATROPIUM-ALBUTEROL 0.5-2.5 (3) MG/3ML IN SOLN
3.0000 mL | Freq: Once | RESPIRATORY_TRACT | Status: AC
  Administered 2016-03-15: 3 mL via RESPIRATORY_TRACT

## 2016-03-15 MED ORDER — ALBUTEROL SULFATE (2.5 MG/3ML) 0.083% IN NEBU
2.5000 mg | INHALATION_SOLUTION | Freq: Once | RESPIRATORY_TRACT | Status: AC
  Administered 2016-03-15: 2.5 mg via RESPIRATORY_TRACT

## 2016-03-15 MED ORDER — ALBUTEROL SULFATE HFA 108 (90 BASE) MCG/ACT IN AERS: 2.0000 | INHALATION_SPRAY | Freq: Four times a day (QID) | RESPIRATORY_TRACT | Status: DC | PRN

## 2016-03-15 MED ORDER — KETOROLAC TROMETHAMINE 60 MG/2ML IM SOLN
INTRAMUSCULAR | Status: AC
  Filled 2016-03-15: qty 2

## 2016-03-15 MED ORDER — AMOXICILLIN 875 MG PO TABS
875.0000 mg | ORAL_TABLET | Freq: Two times a day (BID) | ORAL | Status: DC
Start: 2016-03-15 — End: 2016-07-13

## 2016-03-15 MED ORDER — IPRATROPIUM BROMIDE 0.02 % IN SOLN
RESPIRATORY_TRACT | Status: AC
  Filled 2016-03-15: qty 2.5

## 2016-03-15 NOTE — Discharge Instructions (Signed)
Follow up with PCP ASAP and call for appointment tomorrow Asthma, Acute Bronchospasm Acute bronchospasm caused by asthma is also referred to as an asthma attack. Bronchospasm means your air passages become narrowed. The narrowing is caused by inflammation and tightening of the muscles in the air tubes (bronchi) in your lungs. This can make it hard to breathe or cause you to wheeze and cough. CAUSES Possible triggers are:  Animal dander from the skin, hair, or feathers of animals.  Dust mites contained in house dust.  Cockroaches.  Pollen from trees or grass.  Mold.  Cigarette or tobacco smoke.  Air pollutants such as dust, household cleaners, hair sprays, aerosol sprays, paint fumes, strong chemicals, or strong odors.  Cold air or weather changes. Cold air may trigger inflammation. Winds increase molds and pollens in the air.  Strong emotions such as crying or laughing hard.  Stress.  Certain medicines such as aspirin or beta-blockers.  Sulfites in foods and drinks, such as dried fruits and wine.  Infections or inflammatory conditions, such as a flu, cold, or inflammation of the nasal membranes (rhinitis).  Gastroesophageal reflux disease (GERD). GERD is a condition where stomach acid backs up into your esophagus.  Exercise or strenuous activity. SIGNS AND SYMPTOMS   Wheezing.  Excessive coughing, particularly at night.  Chest tightness.  Shortness of breath. DIAGNOSIS  Your health care provider will ask you about your medical history and perform a physical exam. A chest X-ray or blood testing may be performed to look for other causes of your symptoms or other conditions that may have triggered your asthma attack. TREATMENT  Treatment is aimed at reducing inflammation and opening up the airways in your lungs. Most asthma attacks are treated with inhaled medicines. These include quick relief or rescue medicines (such as bronchodilators) and controller medicines (such  as inhaled corticosteroids). These medicines are sometimes given through an inhaler or a nebulizer. Systemic steroid medicine taken by mouth or given through an IV tube also can be used to reduce the inflammation when an attack is moderate or severe. Antibiotic medicines are only used if a bacterial infection is present.  HOME CARE INSTRUCTIONS   Rest.  Drink plenty of liquids. This helps the mucus to remain thin and be easily coughed up. Only use caffeine in moderation and do not use alcohol until you have recovered from your illness.  Do not smoke. Avoid being exposed to secondhand smoke.  You play a critical role in keeping yourself in good health. Avoid exposure to things that cause you to wheeze or to have breathing problems.  Keep your medicines up-to-date and available. Carefully follow your health care provider's treatment plan.  Take your medicine exactly as prescribed.  When pollen or pollution is bad, keep windows closed and use an air conditioner or go to places with air conditioning.  Asthma requires careful medical care. See your health care provider for a follow-up as advised. If you are more than [redacted] weeks pregnant and you were prescribed any new medicines, let your obstetrician know about the visit and how you are doing. Follow up with your health care provider as directed.  After you have recovered from your asthma attack, make an appointment with your outpatient doctor to talk about ways to reduce the likelihood of future attacks. If you do not have a doctor who manages your asthma, make an appointment with a primary care doctor to discuss your asthma. SEEK IMMEDIATE MEDICAL CARE IF:   You are getting worse.  You have trouble breathing. If severe, call your local emergency services (911 in the [place]  You develop chest pain or discomfort.  You are vomiting.  You are not able to keep fluids down.  You are coughing up yellow, green, brown, or bloody sputum.  You have  a fever and your symptoms suddenly get worse.  You have trouble swallowing. MAKE SURE YOU:   Understand these instructions.  Will watch your condition.  Will get help right away if you are not doing well or get worse.   This information is not intended to replace advice given to you by your health care provider. Make sure you discuss any questions you have with your health care provider.   Document Released: 02/09/2007 Document Revised: 10/30/2013 Document Reviewed: 05/02/2013 Elsevier Interactive Patient Education [employer].

## 2016-03-15 NOTE — ED Notes (Signed)
Cough   Congested   And  wheexing  With a  Stuffy  Nose          For  About 1  Week

## 2016-03-15 NOTE — Telephone Encounter (Signed)
[facility] simvastatin (ZOCOR) 40 MG tablet & metFORMIN (GLUCOPHAGE) 1000 MG tablet

## 2016-03-15 NOTE — ED Provider Notes (Signed)
CSN: [id number]     Arrival date & time 03/15/16  1850 History   None    Chief Complaint  Patient presents with  . Cough   (Consider location/radiation/quality/duration/timing/severity/associated sxs/prior Treatment) Patient is a 65 y.o. female presenting with cough. The history is provided by the patient.  Cough Cough characteristics:  Non-productive Severity:  Severe Onset quality:  Gradual Duration:  1 week Timing:  Constant Progression:  Worsening Chronicity:  Recurrent Smoker: yes   Context: upper respiratory infection   Relieved by:  Beta-agonist inhaler Worsened by:  Smoking Ineffective treatments:  Beta-agonist inhaler Associated symptoms: shortness of breath     Past Medical History  Diagnosis Date  . Hyperlipidemia   . Hypertension   . Tobacco abuse   . COPD (chronic obstructive pulmonary disease) ([place])     History of multiple hospital admissions for exercabation   . Asthma   . Breast cancer ([place]) 1991    s/p lumpectomy, chemotherapy and radiation therapy in 1991. Mammogram in 2007 was normal.  . Sigmoid diverticulitis 80/2008  . Anxiety   . Depression   . Obesity   . GERD (gastroesophageal reflux disease)   . Heart murmur 10/05/11    "first time I ever heard I had one was today"  . Pneumonia   . Shortness of breath 10/05/11    "at rest; lying down; w/exertion"  . Diabetes mellitus   . Bronchitis     h/o  . Diarrhea     h/o  . Constipated     h/o  . COPD with exacerbation ([place]) 04/06/2009    Qualifier: Diagnosis of  By: Eyvonne Mechanic MD, Vijay     Past Surgical History  Procedure Laterality Date  . Dobutamine stress echo  08/2004    Inferior ischemia, normal LV systolic function, no significant CAD  . Abdominal hysterectomy    . Breast surgery  1991    lumphectomy right breast  . Neck surgery  2012    "Dr. Lynann Bologna  put plate in; did something to my vertebrae"   Family History  Problem Relation Age of Onset  . Cancer Mother    Social History   Substance Use Topics  . Smoking status: Current Some Day Smoker -- 0.25 packs/day for 40 years    Types: Cigarettes    Last Attempt to Quit: 09/11/2014  . Smokeless tobacco: Never Used     Comment: slowly quitting.DOWN TO ABOUT  1 CIGARETTES PER DAY  . Alcohol Use: 2.4 oz/week    4 Cans of beer per week     Comment: "only on the weekends"   OB History    No data available     Review of Systems  Constitutional: Positive for fatigue.  HENT: Negative.   Eyes: Negative.   Respiratory: Positive for cough and shortness of breath.   Gastrointestinal: Negative.   Endocrine: Negative.   Genitourinary: Negative.   Musculoskeletal: Negative.   Skin: Negative.   Neurological: Negative.   Hematological: Negative.   Psychiatric/Behavioral: Negative.     Allergies  Ace inhibitors and Flonase  Home Medications   Prior to Admission medications   Medication Sig Start Date End Date Taking? Authorizing Provider  ACCU-CHEK FASTCLIX LANCETS MISC 1 each by Other route See admin instructions. Check blood sugar daily as needed for high blood sugar.    Historical Provider, MD  acetaminophen (TYLENOL) 325 MG tablet Take 650 mg by mouth 2 (two) times daily as needed for headache.    Historical Provider, MD  albuterol (PROVENTIL HFA;VENTOLIN HFA) 108 (90 BASE) MCG/ACT inhaler Inhale 2 puffs into the lungs every 6 (six) hours as needed for wheezing or shortness of breath. 10/28/15   Jule Ser, DO  aspirin EC 81 MG EC tablet Take 1 tablet (81 mg total) by mouth daily. 07/08/15   [name], MD  Blood Glucose Monitoring Suppl (ACCU-CHEK NANO SMARTVIEW) W/DEVICE KIT 1 each by Other route See admin instructions. Check blood sugar daily as needed for high blood sugar.    Historical Provider, MD  budesonide-formoterol (SYMBICORT) 160-4.5 MCG/ACT inhaler Inhale 2 puffs into the lungs 2 (two) times daily. 10/28/15   Jule Ser, DO  buPROPion (WELLBUTRIN XL) 150 MG 24 hr tablet Take 1 tablet (150 mg  total) by mouth daily. To help your smoking urge, depression, and anxiety 03/01/16   Jones Bales, MD  famotidine (PEPCID) 20 MG tablet Take 1 tablet (20 mg total) by mouth daily. For reflux, which can make your cough worse 07/08/15   [name], MD  glucose blood (ACCU-CHEK SMARTVIEW) test strip Use to check blood sugar 1 to 2 times daily. diag code E 11.9. Non- insulin dependent 10/02/14   Sid Falcon, MD  ipratropium-albuterol (DUONEB) 0.5-2.5 (3) MG/3ML SOLN Take 3 mLs by nebulization every 6 (six) hours as needed. 10/28/15   Jule Ser, DO  loratadine (CLARITIN) 10 MG tablet Take 1 tablet (10 mg total) by mouth daily. 06/23/15   [name], MD  losartan-hydrochlorothiazide (HYZAAR) 100-25 MG tablet Take 1 tablet by mouth daily. Cancel previous Rx sent for 30-day supply 12/18/15   Jones Bales, MD  metFORMIN (GLUCOPHAGE) 1000 MG tablet Take 1 tablet (1,000 mg total) by mouth 2 (two) times daily. 03/15/16   Jones Bales, MD  metoprolol tartrate (LOPRESSOR) 50 MG tablet Take 0.5 tablets (25 mg total) by mouth 2 (two) times daily. 07/08/15   [name], MD  nicotine polacrilex (NICORETTE) 2 MG gum Take 1 each (2 mg total) by mouth as needed for smoking cessation. 07/08/15   [name], MD  simvastatin (ZOCOR) 40 MG tablet Take 1 tablet (40 mg total) by mouth daily at 6 PM. 03/01/16   Jones Bales, MD  tiotropium (SPIRIVA HANDIHALER) 18 MCG inhalation capsule Place 1 capsule (18 mcg total) into inhaler and inhale daily. 03/01/16 03/01/17  Jones Bales, MD   Meds Ordered and Administered this Visit  Medications - No data to display  BP 146/83 mmHg  Pulse 75  Temp(Src) 97.8 F (36.6 C) (Oral)  Resp 16  SpO2 100% No data found.   Physical Exam  Constitutional: She is oriented to person, place, and time.  Chronically ill appearing  HENT:  Head: Normocephalic and atraumatic.  Mouth/Throat: Oropharynx is clear and moist.  Eyes: Conjunctivae are normal. Pupils are equal,  round, and reactive to light.  Neck: Normal range of motion. Neck supple.  Cardiovascular: Normal rate, regular rhythm and normal heart sounds.   Pulmonary/Chest: She is in respiratory distress. She has wheezes.  Patient with use of upper accessory muscles and  Lungs diminished throughout with scattered wheezes.  Abdominal: Soft. Bowel sounds are normal.  Neurological: She is alert and oriented to person, place, and time.    ED Course  Procedures (including critical care time)  Labs Review Labs Reviewed - No data to display  Imaging Review No results found.   Visual Acuity Review  Right Eye Distance:   Left Eye Distance:   Bilateral Distance:    Right  Eye Near:   Left Eye Near:    Bilateral Near:         MDM   COPD exacerbation - Nebulizer tx with 2.5/3cc's initially then another given with duoneb 2.42m albuterol and 0.5 atrovent and solumedrol 1265mIM given. Prednisone 106mpo qd x 2d then 3 po qd x 2d then 2 po qd x 2d then 1 po qd x 2 day then stop Amoxicillin 875m16m bid x 10 days #20  Albuterol MDI 2 puffs q 6 hours prn #1 inhaler  Push po fluids, rest, tylenol and motrin otc prn as directed for fever, arthralgias, and myalgias.  Follow up prn if sx's continue or persist.  Please follow up with PCP ASAP  Patient feels a lot better after nebs given and moving air a lot better and no longer using upper accessory muscles.   WillLysbeth PennerP 03/15/16 2141

## 2016-03-16 ENCOUNTER — Telehealth: Payer: Self-pay | Admitting: Pharmacist

## 2016-03-22 NOTE — Telephone Encounter (Signed)
Steroid-Induced Hyperglycemia Prevention and Management DARLEAN HEINICKE is a 65 y.o. female who meets criteria for [facility] quality improvement program (diabetes patient prescribed short course of steroids).  Unable to reach patient

## 2016-04-02 ENCOUNTER — Telehealth: Payer: Self-pay | Admitting: Internal Medicine

## 2016-04-02 NOTE — Telephone Encounter (Signed)
APT. REMINDER CALL, LMTCB °

## 2016-04-04 DIAGNOSIS — Z23 Encounter for immunization: Secondary | ICD-10-CM | POA: Diagnosis not present

## 2016-04-06 ENCOUNTER — Ambulatory Visit (INDEPENDENT_AMBULATORY_CARE_PROVIDER_SITE_OTHER): Payer: Medicare Other | Admitting: Internal Medicine

## 2016-04-06 ENCOUNTER — Encounter: Payer: Self-pay | Admitting: Internal Medicine

## 2016-04-06 ENCOUNTER — Other Ambulatory Visit (HOSPITAL_COMMUNITY)
Admission: RE | Admit: 2016-04-06 | Discharge: 2016-04-06 | Disposition: A | Discharge status: Home / Self Care | Payer: Medicare Other | Source: Ambulatory Visit | Attending: Student in an Organized Health Care Education/Training Program | Admitting: Student in an Organized Health Care Education/Training Program

## 2016-04-06 VITALS — BP 146/78 | HR 81 | Temp 98.7°F | Wt 202.4 lb

## 2016-04-06 DIAGNOSIS — F17210 Nicotine dependence, cigarettes, uncomplicated: Secondary | ICD-10-CM

## 2016-04-06 DIAGNOSIS — E785 Hyperlipidemia, unspecified: Secondary | ICD-10-CM | POA: Diagnosis not present

## 2016-04-06 DIAGNOSIS — E1169 Type 2 diabetes mellitus with other specified complication: Secondary | ICD-10-CM

## 2016-04-06 DIAGNOSIS — E784 Other hyperlipidemia: Secondary | ICD-10-CM

## 2016-04-06 DIAGNOSIS — Z01419 Encounter for gynecological examination (general) (routine) without abnormal findings: Secondary | ICD-10-CM

## 2016-04-06 DIAGNOSIS — Z124 Encounter for screening for malignant neoplasm of cervix: Secondary | ICD-10-CM

## 2016-04-06 DIAGNOSIS — Z0000 Encounter for general adult medical examination without abnormal findings: Secondary | ICD-10-CM

## 2016-04-06 DIAGNOSIS — E11649 Type 2 diabetes mellitus with hypoglycemia without coma: Secondary | ICD-10-CM

## 2016-04-06 DIAGNOSIS — Z1151 Encounter for screening for human papillomavirus (HPV): Secondary | ICD-10-CM | POA: Diagnosis not present

## 2016-04-06 DIAGNOSIS — E118 Type 2 diabetes mellitus with unspecified complications: Secondary | ICD-10-CM | POA: Diagnosis not present

## 2016-04-06 NOTE — Patient Instructions (Addendum)
Thank you for your visit today.   Please return to the internal medicine clinic in about 8 weeks or sooner if needed.     I have made the following additions/changes to your medications:  Continue all of your medications. We will check your lipid panel today.  Please be sure to schedule your mammogram and colonoscopy.  You also need an eye exam.   Please be sure to bring all of your medications with you to every visit; this includes herbal supplements, vitamins, eye drops, and any over-the-counter medications.   Should you have any questions regarding your medications and/or any new or worsening symptoms, please be sure to call the clinic at [phone].   If you believe that you are suffering from a life threatening condition or one that may result in the loss of limb or function, then you should call 911 and proceed to the nearest Emergency Department.

## 2016-04-06 NOTE — Assessment & Plan Note (Signed)
Pt presents for routine pap smear.  Denies any h/o abnormal pap smears.  Unclear when her last pap smear was.  Denies any vaginal discharge, bleeding, burning, itching.   -pap smear today -f/u with pt after results

## 2016-04-06 NOTE — Progress Notes (Signed)
Patient ID: [name], female   DOB: 1951-03-03, 65 y.o.   MRN: [id number]    Subjective:   [name]    DOB: 03/04/1951 65 y.o.    MRN: [id number] Health Maintenance Due: Health Maintenance Due  Topic Date Due  . Hepatitis C Screening  08-Dec-1950  . PAP SMEAR  12/31/1971  . COLONOSCOPY  12/30/2000  . ZOSTAVAX  12/30/2010  . MAMMOGRAM  12/04/2014  . LIPID PANEL  06/15/2015  . DEXA SCAN  12/31/2015  . PNA vac Low Risk Adult (1 of 2 - PCV13) 12/31/2015  . OPHTHALMOLOGY EXAM  02/24/2016    _________________________________________________  HPI: [name] is a 65 y.o. female here for pap smear.  Pt has a PMH outlined below.  Please see problem-based charting assessment and plan for further status of patient's chronic medical problems addressed at today's visit.  PMH: Past Medical History  Diagnosis Date  . Hyperlipidemia   . Hypertension   . Tobacco abuse   . COPD (chronic obstructive pulmonary disease) ([place])     History of multiple hospital admissions for exercabation   . Asthma   . Breast cancer ([place]) 1991    s/p lumpectomy, chemotherapy and radiation therapy in 1991. Mammogram in 2007 was normal.  . Sigmoid diverticulitis 80/2008  . Anxiety   . Depression   . Obesity   . GERD (gastroesophageal reflux disease)   . Heart murmur 10/05/11    "first time I ever heard I had one was today"  . Pneumonia   . Shortness of breath 10/05/11    "at rest; lying down; w/exertion"  . Diabetes mellitus   . Bronchitis     h/o  . Diarrhea     h/o  . Constipated     h/o  . COPD with exacerbation ([place]) 04/06/2009    Qualifier: Diagnosis of  By: Eyvonne Mechanic MD, Vijay      Medications: Current Outpatient Prescriptions on File Prior to Visit  Medication Sig Dispense Refill  . ACCU-CHEK FASTCLIX LANCETS MISC 1 each by Other route See admin instructions. Check blood sugar daily as needed for high blood sugar.    Marland Kitchen acetaminophen (TYLENOL) 325  MG tablet Take 650 mg by mouth 2 (two) times daily as needed for headache.    . albuterol (PROVENTIL HFA;VENTOLIN HFA) 108 (90 Base) MCG/ACT inhaler Inhale 2 puffs into the lungs every 6 (six) hours as needed for wheezing or shortness of breath. 1 Inhaler 2  . amoxicillin (AMOXIL) 875 MG tablet Take 1 tablet (875 mg total) by mouth 2 (two) times daily. 20 tablet 0  . aspirin EC 81 MG EC tablet Take 1 tablet (81 mg total) by mouth daily. 90 tablet 3  . Blood Glucose Monitoring Suppl (ACCU-CHEK NANO SMARTVIEW) W/DEVICE KIT 1 each by Other route See admin instructions. Check blood sugar daily as needed for high blood sugar.    . budesonide-formoterol (SYMBICORT) 160-4.5 MCG/ACT inhaler Inhale 2 puffs into the lungs 2 (two) times daily. 1 Inhaler 12  . buPROPion (WELLBUTRIN XL) 150 MG 24 hr tablet Take 1 tablet (150 mg total) by mouth daily. To help your smoking urge, depression, and anxiety 30 tablet 3  . famotidine (PEPCID) 20 MG tablet Take 1 tablet (20 mg total) by mouth daily. For reflux, which can make your cough worse 90 tablet 3  . glucose blood (ACCU-CHEK SMARTVIEW) test strip Use to check blood sugar 1 to 2 times daily. diag code E 11.9.  Non- insulin dependent 100 each 6  . ipratropium-albuterol (DUONEB) 0.5-2.5 (3) MG/3ML SOLN Take 3 mLs by nebulization every 6 (six) hours as needed. 360 mL 9  . loratadine (CLARITIN) 10 MG tablet Take 1 tablet (10 mg total) by mouth daily. 90 tablet 3  . losartan-hydrochlorothiazide (HYZAAR) 100-25 MG tablet Take 1 tablet by mouth daily. Cancel previous Rx sent for 30-day supply 90 tablet 3  . metFORMIN (GLUCOPHAGE) 1000 MG tablet Take 1 tablet (1,000 mg total) by mouth 2 (two) times daily. 180 tablet 0  . metoprolol tartrate (LOPRESSOR) 50 MG tablet Take 0.5 tablets (25 mg total) by mouth 2 (two) times daily. 90 tablet 9  . nicotine polacrilex (NICORETTE) 2 MG gum Take 1 each (2 mg total) by mouth as needed for smoking cessation. 100 tablet 0  . predniSONE  (DELTASONE) 10 MG tablet Take 4 po qd x 2 days then 3 po qd x 2 days then 2po qd x 2 days then 1 po qd x 2 days then stop 20 tablet 0  . simvastatin (ZOCOR) 40 MG tablet Take 1 tablet (40 mg total) by mouth daily at 6 PM. 30 tablet 3  . tiotropium (SPIRIVA HANDIHALER) 18 MCG inhalation capsule Place 1 capsule (18 mcg total) into inhaler and inhale daily. 30 capsule 3  . [DISCONTINUED] albuterol (PROVENTIL,VENTOLIN) 90 MCG/ACT inhaler Inhale 2 puffs into the lungs every 6 (six) hours as needed for wheezing. 17 g 12   No current facility-administered medications on file prior to visit.    Allergies: Allergies  Allergen Reactions  . Ace Inhibitors Swelling    Throat swelling.  [name] [Fluticasone] Other (See Comments)    Sinuses stop up and condition worsens    FH: Family History  Problem Relation Age of Onset  . Cancer Mother     SH: Social History   Social History  . Marital Status: Married    Spouse Name: N/A  . Number of Children: N/A  . Years of Education: 12   Occupational History  .  Unemployed   Social History Main Topics  . Smoking status: Current Some Day Smoker -- 0.25 packs/day for 40 years    Types: Cigarettes    Last Attempt to Quit: 09/11/2014  . Smokeless tobacco: Never Used     Comment: slowly quitting.DOWN TO ABOUT  1 CIGARETTES PER DAY  . Alcohol Use: 2.4 oz/week    4 Cans of beer per week     Comment: "only on the weekends"  . Drug Use: No  . Sexual Activity: Not on file   Other Topics Concern  . Not on file   Social History Narrative   Lives in [place] with her husband.   Takes care of 3 grand children.   Trying to find a job, has financial difficulties.          Review of Systems: Genitourinary: Negative for itching, burning, vaginal discharge, lesions.     Objective:   Vital Signs: Filed Vitals:   04/06/16 0816  BP: 146/78  Pulse: 81  Temp: 98.7 F (37.1 C)  TempSrc: Oral  Weight: 202 lb 6.4 oz (91.808 kg)  SpO2: 100%       BP Readings from Last 3 Encounters:  04/06/16 146/78  03/15/16 146/83  03/01/16 127/66    Physical Exam: Constitutional: Vital signs reviewed.  Patient is in NAD and cooperative with exam.  Head: Normocephalic and atraumatic. Eyes: EOMI, conjunctivae nl, no scleral icterus.  Neck: Supple. Cardiovascular: RRR, no  MRG. Pulmonary/Chest: Normal effort.  Abdominal: Soft. NT/ND +BS. Genitourinary: External genitalia appeared normal.  Vaginal mucosa pink. Cervix is of normal color without lesion.  No bleeding noted.  Neurological: A&O x3, cranial nerves II-XII are grossly intact, moving all extremities. Skin: Warm, dry and intact.    Assessment & Plan:   Assessment and plan was discussed and formulated with my attending.

## 2016-04-07 ENCOUNTER — Encounter: Payer: Self-pay | Admitting: Internal Medicine

## 2016-04-07 LAB — LIPID PANEL
CHOL/HDL RATIO: 3.3 ratio (ref 0.0–4.4)
Cholesterol, Total: 145 mg/dL (ref 100–199)
HDL: 44 mg/dL (ref >39)
LDL CALC: 75 mg/dL (ref 0–99)
Triglycerides: 132 mg/dL (ref 0–149)
VLDL CHOLESTEROL CAL: 26 mg/dL (ref 5–40)

## 2016-04-07 LAB — CYTOLOGY - PAP

## 2016-04-07 NOTE — Progress Notes (Signed)
Internal Medicine Clinic Attending  Case discussed with Dr. Gill soon after the resident saw the patient.  We reviewed the resident's history and exam and pertinent patient test results.  I agree with the assessment, diagnosis, and plan of care documented in the resident's note.  

## 2016-04-07 NOTE — Assessment & Plan Note (Signed)
She is on simvastatin 40mg  daily. -check lipid panel today (did not get at last OV)

## 2016-04-19 ENCOUNTER — Encounter: Payer: Self-pay | Admitting: *Deleted

## 2016-06-14 ENCOUNTER — Other Ambulatory Visit: Payer: Self-pay | Admitting: *Deleted

## 2016-06-17 MED ORDER — ASPIRIN 81 MG PO TBEC: 81.0000 mg | DELAYED_RELEASE_TABLET | Freq: Every day | ORAL | 0 refills | Status: DC

## 2016-06-22 DIAGNOSIS — Z23 Encounter for immunization: Secondary | ICD-10-CM | POA: Diagnosis not present

## 2016-07-01 ENCOUNTER — Encounter (HOSPITAL_COMMUNITY): Payer: Self-pay | Admitting: Emergency Medicine

## 2016-07-01 ENCOUNTER — Ambulatory Visit (INDEPENDENT_AMBULATORY_CARE_PROVIDER_SITE_OTHER): Payer: Medicare Other

## 2016-07-01 ENCOUNTER — Ambulatory Visit (HOSPITAL_COMMUNITY)
Admission: EM | Admit: 2016-07-01 | Discharge: 2016-07-01 | Disposition: A | Discharge status: Home / Self Care | Payer: Medicare Other | Attending: Emergency Medicine | Admitting: Emergency Medicine

## 2016-07-01 DIAGNOSIS — J019 Acute sinusitis, unspecified: Secondary | ICD-10-CM

## 2016-07-01 DIAGNOSIS — R0602 Shortness of breath: Secondary | ICD-10-CM | POA: Diagnosis not present

## 2016-07-01 DIAGNOSIS — J441 Chronic obstructive pulmonary disease with (acute) exacerbation: Secondary | ICD-10-CM

## 2016-07-01 DIAGNOSIS — B9689 Other specified bacterial agents as the cause of diseases classified elsewhere: Secondary | ICD-10-CM

## 2016-07-01 DIAGNOSIS — R05 Cough: Secondary | ICD-10-CM | POA: Diagnosis not present

## 2016-07-01 MED ORDER — IPRATROPIUM-ALBUTEROL 0.5-2.5 (3) MG/3ML IN SOLN
RESPIRATORY_TRACT | Status: AC
  Filled 2016-07-01: qty 3

## 2016-07-01 MED ORDER — PREDNISONE 20 MG PO TABS: ORAL_TABLET | ORAL | 0 refills | Status: DC

## 2016-07-01 MED ORDER — AMOXICILLIN-POT CLAVULANATE 875-125 MG PO TABS: 1.0000 | ORAL_TABLET | Freq: Two times a day (BID) | ORAL | 0 refills | Status: DC

## 2016-07-01 MED ORDER — ACETAMINOPHEN 325 MG PO TABS
ORAL_TABLET | ORAL | Status: AC
  Filled 2016-07-01: qty 3

## 2016-07-01 MED ORDER — ACETAMINOPHEN 500 MG PO TABS
1000.0000 mg | ORAL_TABLET | Freq: Once | ORAL | Status: AC
  Administered 2016-07-01: 1000 mg via ORAL

## 2016-07-01 MED ORDER — BENZONATATE 100 MG PO CAPS
100.0000 mg | ORAL_CAPSULE | Freq: Three times a day (TID) | ORAL | 0 refills | Status: DC
Start: 2016-07-01 — End: 2016-07-22

## 2016-07-01 MED ORDER — METHYLPREDNISOLONE ACETATE 80 MG/ML IJ SUSP
80.0000 mg | Freq: Once | INTRAMUSCULAR | Status: AC
  Administered 2016-07-01: 80 mg via INTRAMUSCULAR

## 2016-07-01 MED ORDER — IPRATROPIUM-ALBUTEROL 0.5-2.5 (3) MG/3ML IN SOLN
3.0000 mL | Freq: Once | RESPIRATORY_TRACT | Status: AC
  Administered 2016-07-01: 3 mL via RESPIRATORY_TRACT

## 2016-07-01 MED ORDER — SALINE SPRAY 0.65 % NA SOLN: 1.0000 | NASAL | 1 refills | Status: DC | PRN

## 2016-07-01 MED ORDER — METHYLPREDNISOLONE ACETATE 80 MG/ML IJ SUSP
INTRAMUSCULAR | Status: AC
  Filled 2016-07-01: qty 1

## 2016-07-01 NOTE — Discharge Instructions (Signed)
° °  It appears you still have several refills of your albuterol inhaler as well as your Symbicort at your pharmacy.  Please ask them to refill your inhalers.

## 2016-07-01 NOTE — ED Provider Notes (Signed)
CSN: [id number]     Arrival date & time 07/01/16  1027 History   First MD Initiated Contact with Patient 07/01/16 1048     Chief Complaint  Patient presents with  . Shortness of Breath   (Consider location/radiation/quality/duration/timing/severity/associated sxs/prior Treatment) HPI  [name] is a 65 y.o. female with hx of asthma and COPD presenting to UC with c/o gradually worsening sinus congestion with pressure, moderately productive cough with wheeze and SOB for 4 days.  Associated frontal headache and fatigue.  She tried her albuterol inhaler and nebulizer machine at home w/o relief. She notes she has had "cold-like" symptoms for over 2 weeks. Last week, her daughter was also sick.  Pt did get her flu shot last week.  Hx of pneumonia several years ago but she did have to be hospitalized for it.  No recent travel.   Past Medical History:  Diagnosis Date  . Anxiety   . Asthma   . Breast cancer ([place]) 1991   s/p lumpectomy, chemotherapy and radiation therapy in 1991. Mammogram in 2007 was normal.  . Bronchitis    h/o  . Constipated    h/o  . COPD (chronic obstructive pulmonary disease) ([place])    History of multiple hospital admissions for exercabation   . COPD with exacerbation ([place]) 04/06/2009   Qualifier: Diagnosis of  By: Eyvonne Mechanic MD, Vijay    . Depression   . Diabetes mellitus   . Diarrhea    h/o  . GERD (gastroesophageal reflux disease)   . Heart murmur 10/05/11   "first time I ever heard I had one was today"  . Hyperlipidemia   . Hypertension   . Obesity   . Pneumonia   . Shortness of breath 10/05/11   "at rest; lying down; w/exertion"  . Sigmoid diverticulitis 80/2008  . Tobacco abuse    Past Surgical History:  Procedure Laterality Date  . ABDOMINAL HYSTERECTOMY    . BREAST SURGERY  1991   lumphectomy right breast  . DOBUTAMINE STRESS ECHO  08/2004   Inferior ischemia, normal LV systolic function, no significant CAD  . NECK SURGERY  2012   "Dr.  Lynann Bologna  put plate in; did something to my vertebrae"   Family History  Problem Relation Age of Onset  . Cancer Mother    Social History  Substance Use Topics  . Smoking status: Current Some Day Smoker    Packs/day: 0.25    Years: 40.00    Types: Cigarettes    Last attempt to quit: 09/11/2014  . Smokeless tobacco: Never Used     Comment: slowly quitting.DOWN TO ABOUT  1 CIGARETTES PER DAY  . Alcohol use 2.4 oz/week    4 Cans of beer per week     Comment: "only on the weekends"   OB History    No data available     Review of Systems  Constitutional: Positive for fatigue. Negative for appetite change, chills and fever.  HENT: Positive for congestion, postnasal drip, rhinorrhea, sinus pressure and sore throat. Negative for ear pain, sneezing, trouble swallowing and voice change.   Respiratory: Positive for cough, chest tightness, shortness of breath and wheezing. Negative for stridor.   Cardiovascular: Negative for chest pain and palpitations.  Gastrointestinal: Negative for abdominal pain, diarrhea, nausea and vomiting.  Neurological: Positive for headaches. Negative for dizziness and light-headedness.    Allergies  Ace inhibitors and Flonase [fluticasone]  Home Medications   Prior to Admission medications   Medication Sig Start Date  End Date Taking? Authorizing Provider  ACCU-CHEK FASTCLIX LANCETS MISC 1 each by Other route See admin instructions. Check blood sugar daily as needed for high blood sugar.    Historical Provider, MD  acetaminophen (TYLENOL) 325 MG tablet Take 650 mg by mouth 2 (two) times daily as needed for headache.    Historical Provider, MD  albuterol (PROVENTIL HFA;VENTOLIN HFA) 108 (90 Base) MCG/ACT inhaler Inhale 2 puffs into the lungs every 6 (six) hours as needed for wheezing or shortness of breath. 03/15/16   Lysbeth Penner, FNP  amoxicillin (AMOXIL) 875 MG tablet Take 1 tablet (875 mg total) by mouth 2 (two) times daily. 03/15/16   Lysbeth Penner, FNP   amoxicillin-clavulanate (AUGMENTIN) 875-125 MG tablet Take 1 tablet by mouth 2 (two) times daily. One po bid x 7 days 07/01/16   Noland Fordyce, PA-C  aspirin 81 MG EC tablet Take 1 tablet (81 mg total) by mouth daily. 06/17/16   Sid Falcon, MD  benzonatate (TESSALON) 100 MG capsule Take 1 capsule (100 mg total) by mouth every 8 (eight) hours. 07/01/16   Noland Fordyce, PA-C  Blood Glucose Monitoring Suppl (ACCU-CHEK NANO SMARTVIEW) W/DEVICE KIT 1 each by Other route See admin instructions. Check blood sugar daily as needed for high blood sugar.    Historical Provider, MD  budesonide-formoterol (SYMBICORT) 160-4.5 MCG/ACT inhaler Inhale 2 puffs into the lungs 2 (two) times daily. 10/28/15   Jule Ser, DO  buPROPion (WELLBUTRIN XL) 150 MG 24 hr tablet Take 1 tablet (150 mg total) by mouth daily. To help your smoking urge, depression, and anxiety 03/01/16   Jones Bales, MD  famotidine (PEPCID) 20 MG tablet Take 1 tablet (20 mg total) by mouth daily. For reflux, which can make your cough worse 07/08/15   [name], MD  glucose blood (ACCU-CHEK SMARTVIEW) test strip Use to check blood sugar 1 to 2 times daily. diag code E 11.9. Non- insulin dependent 10/02/14   Sid Falcon, MD  ipratropium-albuterol (DUONEB) 0.5-2.5 (3) MG/3ML SOLN Take 3 mLs by nebulization every 6 (six) hours as needed. 10/28/15   Jule Ser, DO  loratadine (CLARITIN) 10 MG tablet Take 1 tablet (10 mg total) by mouth daily. 06/23/15   [name], MD  losartan-hydrochlorothiazide (HYZAAR) 100-25 MG tablet Take 1 tablet by mouth daily. Cancel previous Rx sent for 30-day supply 12/18/15   Jones Bales, MD  metFORMIN (GLUCOPHAGE) 1000 MG tablet Take 1 tablet (1,000 mg total) by mouth 2 (two) times daily. 03/15/16   Jones Bales, MD  metoprolol tartrate (LOPRESSOR) 50 MG tablet Take 0.5 tablets (25 mg total) by mouth 2 (two) times daily. 07/08/15   [name], MD  nicotine polacrilex (NICORETTE) 2 MG gum Take 1 each (2 mg  total) by mouth as needed for smoking cessation. 07/08/15   [name], MD  predniSONE (DELTASONE) 10 MG tablet Take 4 po qd x 2 days then 3 po qd x 2 days then 2po qd x 2 days then 1 po qd x 2 days then stop 03/15/16   Lysbeth Penner, FNP  predniSONE (DELTASONE) 20 MG tablet 3 tabs po day one, then 2 po daily x 4 days 07/01/16   Noland Fordyce, PA-C  simvastatin (ZOCOR) 40 MG tablet Take 1 tablet (40 mg total) by mouth daily at 6 PM. 03/01/16   Jones Bales, MD  sodium chloride (OCEAN) 0.65 % SOLN nasal spray Place 1 spray into both nostrils as needed for congestion.  07/01/16   Noland Fordyce, PA-C  tiotropium (SPIRIVA HANDIHALER) 18 MCG inhalation capsule Place 1 capsule (18 mcg total) into inhaler and inhale daily. 03/01/16 03/01/17  Jones Bales, MD   Meds Ordered and Administered this Visit   Medications  ipratropium-albuterol (DUONEB) 0.5-2.5 (3) MG/3ML nebulizer solution 3 mL (3 mLs Nebulization Given 07/01/16 1100)  methylPREDNISolone acetate (DEPO-MEDROL) injection 80 mg (80 mg Intramuscular Given 07/01/16 1111)  acetaminophen (TYLENOL) tablet 1,000 mg (1,000 mg Oral Given 07/01/16 1118)    BP 150/85 (BP Location: Right Arm)   Pulse 88   Temp 99 F (37.2 C) (Oral)   Resp 18   SpO2 97%  No data found.   Physical Exam  Constitutional: She appears well-developed and well-nourished. No distress.  HENT:  Head: Normocephalic and atraumatic.  Right Ear: Tympanic membrane normal.  Left Ear: Tympanic membrane normal.  Nose: Mucosal edema present. Right sinus exhibits maxillary sinus tenderness and frontal sinus tenderness. Left sinus exhibits maxillary sinus tenderness and frontal sinus tenderness.  Mouth/Throat: Uvula is midline, oropharynx is clear and moist and mucous membranes are normal.  Eyes: Conjunctivae are normal. No scleral icterus.  Neck: Normal range of motion. Neck supple.  Cardiovascular: Normal rate, regular rhythm and normal heart sounds.   Pulmonary/Chest: Effort  normal. No respiratory distress. She has decreased breath sounds in the right lower field and the left lower field. She has wheezes. She has no rales.  Intermittent productive cough. Winded between sentences. Diffuse expiratory wheeze.  Abdominal: Soft. Bowel sounds are normal. She exhibits no distension and no mass. There is no tenderness. There is no rebound and no guarding.  Musculoskeletal: Normal range of motion.  Neurological: She is alert.  Skin: Skin is warm and dry. She is not diaphoretic.  Nursing note and vitals reviewed.   Urgent Care Course   Clinical Course    Procedures (including critical care time)  Labs Review Labs Reviewed - No data to display  Imaging Review Dg Chest 2 View  Result Date: 07/01/2016 CLINICAL DATA:  Productive cough, shortness of breath, wheezing, history of asthma EXAM: CHEST  2 VIEW COMPARISON:  Chest x-ray of 07/03/2015 and 06/20/2015 FINDINGS: No active infiltrate or effusion is seen. The lungs are slightly hyperaerated. Mediastinal and hilar contours are unremarkable. The heart is within upper limits normal. Surgical clips overlie the right axilla. A lower anterior cervical spine fusion plate is present. IMPRESSION: No active cardiopulmonary disease.  Slight hyper aeration. Electronically Signed   By: Ivar Drape M.D.   On: 07/01/2016 11:48    MDM   1. COPD exacerbation ([place])   2. Acute bacterial rhinosinusitis   3. COPD, frequent exacerbations ([place])    Pt c/o 2 weeks of worsening cough, congestion and sinus pressure.   O2 Sat on RA: 97%    Pt given depomedrol and duoneb tx in UC, lung sounds improved. CXR: no evidence of pneumonia. Due to duration of symptoms and worsening sinus pain/pressure, will treat with Augmentin, tessalon, and oral prednisone.   Encouraged rest and fluids. F/u with PCP in 4-5 days if not improving, sooner if worsening. Pt verbalized understanding and agreement with tx plan.     Noland Fordyce, PA-C 07/01/16  1227

## 2016-07-01 NOTE — ED Triage Notes (Signed)
The patient presented to the [facility] with a complaint of shortness of breat, cough and headache and congestion x 4 days. The patient reported a hx of asthma. The patient stated that she has been using an albuterol nebulizer treatments at home with no relief.

## 2016-07-13 ENCOUNTER — Telehealth: Payer: Self-pay | Admitting: Internal Medicine

## 2016-07-13 ENCOUNTER — Encounter: Payer: Self-pay | Admitting: Internal Medicine

## 2016-07-13 ENCOUNTER — Ambulatory Visit (INDEPENDENT_AMBULATORY_CARE_PROVIDER_SITE_OTHER): Payer: Medicare Other | Admitting: Internal Medicine

## 2016-07-13 DIAGNOSIS — J449 Chronic obstructive pulmonary disease, unspecified: Secondary | ICD-10-CM | POA: Diagnosis not present

## 2016-07-13 DIAGNOSIS — F17210 Nicotine dependence, cigarettes, uncomplicated: Secondary | ICD-10-CM | POA: Diagnosis not present

## 2016-07-13 DIAGNOSIS — I10 Essential (primary) hypertension: Secondary | ICD-10-CM

## 2016-07-13 DIAGNOSIS — J441 Chronic obstructive pulmonary disease with (acute) exacerbation: Secondary | ICD-10-CM

## 2016-07-13 MED ORDER — TIOTROPIUM BROMIDE MONOHYDRATE 18 MCG IN CAPS: 18.0000 ug | ORAL_CAPSULE | Freq: Every day | RESPIRATORY_TRACT | 3 refills | Status: DC

## 2016-07-13 MED ORDER — LOSARTAN POTASSIUM-HCTZ 100-25 MG PO TABS: 1.0000 | ORAL_TABLET | Freq: Every day | ORAL | 3 refills | Status: DC

## 2016-07-13 MED ORDER — IPRATROPIUM-ALBUTEROL 0.5-2.5 (3) MG/3ML IN SOLN: 3.0000 mL | Freq: Once | RESPIRATORY_TRACT | Status: DC

## 2016-07-13 MED ORDER — METHYLPREDNISOLONE SODIUM SUCC 40 MG IJ SOLR
40.0000 mg | Freq: Once | INTRAMUSCULAR | Status: AC
  Administered 2016-07-13: 40 mg via INTRAMUSCULAR

## 2016-07-13 MED ORDER — AZITHROMYCIN 250 MG PO TABS: ORAL_TABLET | ORAL | 0 refills | Status: DC

## 2016-07-13 MED ORDER — ALBUTEROL SULFATE (2.5 MG/3ML) 0.083% IN NEBU
2.5000 mg | INHALATION_SOLUTION | Freq: Once | RESPIRATORY_TRACT | Status: AC
  Administered 2016-07-13: 2.5 mg via RESPIRATORY_TRACT

## 2016-07-13 MED ORDER — PREDNISONE 20 MG PO TABS: 40.0000 mg | ORAL_TABLET | Freq: Every day | ORAL | 0 refills | Status: DC

## 2016-07-13 MED ORDER — BUDESONIDE-FORMOTEROL FUMARATE 160-4.5 MCG/ACT IN AERO
2.0000 | INHALATION_SPRAY | Freq: Two times a day (BID) | RESPIRATORY_TRACT | 12 refills | Status: DC
Start: 2016-07-13 — End: 2016-07-13

## 2016-07-13 MED ORDER — LORATADINE 10 MG PO TABS: 10.0000 mg | ORAL_TABLET | Freq: Every day | ORAL | 3 refills | Status: DC

## 2016-07-13 MED ORDER — FLUTICASONE-SALMETEROL 250-50 MCG/DOSE IN AEPB: 1.0000 | INHALATION_SPRAY | Freq: Two times a day (BID) | RESPIRATORY_TRACT | 3 refills | Status: DC

## 2016-07-13 MED ORDER — FAMOTIDINE 20 MG PO TABS: 20.0000 mg | ORAL_TABLET | Freq: Every day | ORAL | 3 refills | Status: DC

## 2016-07-13 MED ORDER — IPRATROPIUM BROMIDE 0.02 % IN SOLN
0.5000 mg | Freq: Once | RESPIRATORY_TRACT | Status: AC
  Administered 2016-07-13: 0.5 mg via RESPIRATORY_TRACT

## 2016-07-13 MED ORDER — METOPROLOL TARTRATE 50 MG PO TABS: 25.0000 mg | ORAL_TABLET | Freq: Two times a day (BID) | ORAL | 9 refills | Status: DC

## 2016-07-13 MED ORDER — METFORMIN HCL 1000 MG PO TABS: 1000.0000 mg | ORAL_TABLET | Freq: Two times a day (BID) | ORAL | 0 refills | Status: DC

## 2016-07-13 NOTE — Patient Instructions (Addendum)
Please continue to take your medications and inhalers at prescribed.   It is important that you take your new Advair inhaler twice daily and your Spiriva inhaler once daily to prevent your COPD from acting up.  You can continue to take your Albuterol rescue inhaler and Duoneb nebulizer treatments as needed.   After your steroid shot and Duoneb treatment today, we have prescribed 5 days of Prednisone and Azithromycin.  Please call us or visit the ED if your symptoms acutely worsen!

## 2016-07-13 NOTE — Telephone Encounter (Signed)
T. REMINDER CALL, LMTCB °

## 2016-07-13 NOTE — Assessment & Plan Note (Signed)
>>  ASSESSMENT AND PLAN FOR CHRONIC OBSTRUCTIVE PULMONARY DISEASE WITH BRONCHOSPASM ([place]) WRITTEN ON 07/13/2016  4:43 PM BY Wynetta Emery, ADAM, MD  ACC visit c/o 2.5 weeks prog worsening cough, congestion, shortness of breath, inability to lay flat consistent with COPD exacerbation (similar to previous). Only taking Albuterol and Duonebs at home, inconsistent Spiriva, states cannot afford Symbicort. Also experiencing rhinorrhea and itchy eyes and has history of seasonal allergies. Was seen in urgent care on 8/24, received nebs, solumedrol, prednisone taper, amox 10d, CXR showed hyperinflation no pneumonia. No improvement since then. Nearly had panic attack in clinic, anxious, short of breath, some expiratory wheezing on exam. Placed on 2L O2 via [place].   Will give Solumedrol 40mg  IV, Duoneb x1 in clinic. Will Rx Zpack (Azithromycin), Prednisone 40mg  for 5d and provide refills for Claritin, Spiriva, and GERD/HTN/DM medications. Will discontinue her Symbicort inhaler (pharmacy ran a price check, costing $300+), will instead prescribe Advair 250/50 ($40-50) for 90 days and plan to follow up COPD control in 1 month.

## 2016-07-13 NOTE — Progress Notes (Signed)
   CC: Cough and shortness of breath  HPI: [name] is a 65 y.o. female with PMHx detailed below presenting with symptoms concerning for COPD exacerbation.  She was seen in urgent care on 07/01/16 for 4d cough/SOB/URI symptoms, felt to have COPD exac and acute bacterial rhinosinusitis. Given Rx for Augmentin, tessalon, and po Prednisone. No improvement since then.  See problem based assessment and plan below for additional details.  Past Medical History:  Diagnosis Date  . Anxiety   . Asthma   . Breast cancer ([place]) 1991   s/p lumpectomy, chemotherapy and radiation therapy in 1991. Mammogram in 2007 was normal.  . Bronchitis    h/o  . Constipated    h/o  . COPD (chronic obstructive pulmonary disease) ([place])    History of multiple hospital admissions for exercabation   . COPD with exacerbation ([name]) 04/06/2009   Qualifier: Diagnosis of  By: Eyvonne Mechanic MD, Vijay    . Depression   . Diabetes mellitus   . Diarrhea    h/o  . GERD (gastroesophageal reflux disease)   . Heart murmur 10/05/11   "first time I ever heard I had one was today"  . Hyperlipidemia   . Hypertension   . Obesity   . Pneumonia   . Shortness of breath 10/05/11   "at rest; lying down; w/exertion"  . Sigmoid diverticulitis 80/2008  . Tobacco abuse     Review of Systems: Review of Systems  Constitutional: Positive for chills and malaise/fatigue. Negative for fever.  HENT: Positive for congestion. Negative for sore throat.   Eyes: Positive for redness.  Respiratory: Positive for cough, sputum production, shortness of breath and wheezing.   Cardiovascular: Negative for chest pain and orthopnea.  Gastrointestinal: Positive for heartburn.  Neurological: Positive for weakness and headaches.     Physical Exam: Vitals:   07/13/16 1454  BP: (!) 127/59  Pulse: 99  Temp: 98.4 F (36.9 C)  TempSrc: Oral  SpO2: 100%  Weight: 192 lb 3.2 oz (87.2 kg)  Height: 5\' 7"  (1.702 m)   GENERAL- alert,  co-operative, moderate distress HEENT- Atraumatic, PERRL, EOMI, oral mucosa appears moist, intact dentition, white tongue plaque present, mildly erythematous oropharynx, no cervical LN enlargement or pain, sinuses non-tender to palpation. CARDIAC- Tachycardic, normal rhythm, no murmurs, rubs or gallops. RESP- Decreased breath sounds with expiratory wheezes bilaterally, no accessory muscle use, respirations interrupted by coughing repeatedly ABDOMEN- Soft, nontender, nondistended BACK- Normal curvature, no paraspinal tenderness NEURO- Alert and oriented, cranial nerves grossly intact EXTREMITIES- pulse 2+, symmetric, no pedal edema. SKIN- Warm, dry, no rash or lesion. PSYCH- Anxious affect, appropriate thought content and speech.   Assessment & Plan:   See encounters tab for problem based medical decision making.   Patient seen with Dr. Dareen Piano

## 2016-07-13 NOTE — Assessment & Plan Note (Addendum)
ACC visit c/o 2.5 weeks prog worsening cough, congestion, shortness of breath, inability to lay flat consistent with COPD exacerbation (similar to previous). Only taking Albuterol and Duonebs at home, inconsistent Spiriva, states cannot afford Symbicort. Also experiencing rhinorrhea and itchy eyes and has history of seasonal allergies. Was seen in urgent care on 8/24, received nebs, solumedrol, prednisone taper, amox 10d, CXR showed hyperinflation no pneumonia. No improvement since then. Nearly had panic attack in clinic, anxious, short of breath, some expiratory wheezing on exam. Placed on 2L O2 via [place].   Will give Solumedrol 40mg  IV, Duoneb x1 in clinic. Will Rx Zpack (Azithromycin), Prednisone 40mg  for 5d and provide refills for Claritin, Spiriva, and GERD/HTN/DM medications. Will discontinue her Symbicort inhaler (pharmacy ran a price check, costing $300+), will instead prescribe Advair 250/50 ($40-50) for 90 days and plan to follow up COPD control in 1 month.

## 2016-07-14 ENCOUNTER — Telehealth: Payer: Self-pay | Admitting: Pharmacist

## 2016-07-14 ENCOUNTER — Ambulatory Visit: Payer: Medicare Other

## 2016-07-14 DIAGNOSIS — E119 Type 2 diabetes mellitus without complications: Secondary | ICD-10-CM

## 2016-07-14 NOTE — Telephone Encounter (Signed)
Steroid-Induced Hyperglycemia Prevention and Management [name] is a 65 y.o. female who meets criteria for [facility] quality improvement program (diabetes patient prescribed steroids).  Unable to reach patient x 1 will try again

## 2016-07-16 ENCOUNTER — Ambulatory Visit: Payer: Medicare Other | Admitting: Pharmacist

## 2016-07-16 MED ORDER — GLIPIZIDE 5 MG PO TABS: 5.0000 mg | ORAL_TABLET | Freq: Every day | ORAL | 0 refills | Status: DC

## 2016-07-16 MED FILL — glipiZIDE 5 MG TABS: 5 | 5 days supply | Qty: 5 | Fill #0

## 2016-07-16 NOTE — Telephone Encounter (Addendum)
Steroid-Induced Hyperglycemia Prevention and Management [name] is a 65 y.o. female who meets criteria for [facility] quality improvement program (diabetes patient prescribed short course of steroids).  A/P Current Regimen  Patient prescribed prednisone 2 tablets (40 mg total) by mouth daily x 10 days, currently on day 7 of therapy. Patient taking prednisone in the AM  Prednisone indication: COPD exacerbation  Current DM regimen metformin 1000 mg BID  Home BG Monitoring  Patient does have a meter at home and does not check BG at home.  A1C prior to steroid course 6.3  S/Sx of hyperglycemia: excessive thirst and polyuria  Medication Management  Additional treatment for BG control is indicated at this time.  Glipizide 5 mg daily if BG > 200 over the weekend  Patient unable to afford/pick up Advair until next week, will provide [facility] sample  Patient Education  Advised patient to monitor BG while on steroid therapy (at least twice daily).  Patient educated about signs/symptoms and advised to contact clinic if hyper- or hypoglycemic.  Patient did  verbalize understanding of information and regimen by repeating back topics discussed.  Follow-up 07/19/16 (Monday)  Due in Oct for PCP follow-up, patient notified  Kim,Jennifer J 10:33 AM 07/16/2016

## 2016-07-16 NOTE — Progress Notes (Signed)
Medication Samples have been provided to the patient.  Drug name: DULERA       Strength: 100 MCG/5MCG        Qty: 1 INHALER  LOTAY:[id number]  Exp.Date: Nov 18 2016  Dosing instructions: INHALE 2 PUFFS BID. RINSE AND SPIT AFTER USE.  The patient has been instructed regarding the correct time, dose, and frequency of taking this medication, including desired effects and most common side effects.   Also brought patient Glipizide 5 mg.  During home visit, took pt's BG with home monitor and instructed on medication use.  BG on 07/16/16: [place] 12:05 PM 07/16/2016

## 2016-07-16 NOTE — Progress Notes (Signed)
Patient was seen in clinic with Terald Sleeper, PharmD candidate. I agree with the assessment and plan of care documented.

## 2016-07-19 NOTE — Telephone Encounter (Signed)
Prednisone course completed. Patient had no BG > 200 over the weekend (states BG has been in the 160s), no signs/symptoms of hyperglycemia (did not take any doses of glipizide). Patient also states she has completed antibiotic course, breathing symptoms improved. Advised patient to contact clinic if any further concerns, patient verbalized understanding. Signing off consult for now.

## 2016-07-22 ENCOUNTER — Inpatient Hospital Stay (HOSPITAL_COMMUNITY): Payer: Medicare Other

## 2016-07-22 ENCOUNTER — Inpatient Hospital Stay (HOSPITAL_COMMUNITY)
Admission: AD | Admit: 2016-07-22 | Discharge: 2016-07-26 | DRG: 192 | Disposition: A | Discharge status: Home / Self Care | Payer: Medicare Other | Source: Ambulatory Visit | Attending: Student in an Organized Health Care Education/Training Program | Admitting: Student in an Organized Health Care Education/Training Program

## 2016-07-22 ENCOUNTER — Telehealth: Payer: Self-pay | Admitting: Pharmacist

## 2016-07-22 ENCOUNTER — Ambulatory Visit (INDEPENDENT_AMBULATORY_CARE_PROVIDER_SITE_OTHER): Payer: Medicare Other | Admitting: Internal Medicine

## 2016-07-22 ENCOUNTER — Other Ambulatory Visit: Payer: Self-pay

## 2016-07-22 ENCOUNTER — Encounter (HOSPITAL_COMMUNITY): Payer: Self-pay | Admitting: General Practice

## 2016-07-22 VITALS — BP 126/61 | HR 89 | Temp 99.1°F | Ht 67.0 in | Wt 196.2 lb

## 2016-07-22 DIAGNOSIS — Z6830 Body mass index (BMI) 30.0-30.9, adult: Secondary | ICD-10-CM

## 2016-07-22 DIAGNOSIS — Z853 Personal history of malignant neoplasm of breast: Secondary | ICD-10-CM

## 2016-07-22 DIAGNOSIS — F17200 Nicotine dependence, unspecified, uncomplicated: Secondary | ICD-10-CM | POA: Diagnosis present

## 2016-07-22 DIAGNOSIS — E785 Hyperlipidemia, unspecified: Secondary | ICD-10-CM | POA: Diagnosis present

## 2016-07-22 DIAGNOSIS — E1169 Type 2 diabetes mellitus with other specified complication: Secondary | ICD-10-CM | POA: Diagnosis present

## 2016-07-22 DIAGNOSIS — Z7952 Long term (current) use of systemic steroids: Secondary | ICD-10-CM

## 2016-07-22 DIAGNOSIS — Z79899 Other long term (current) drug therapy: Secondary | ICD-10-CM

## 2016-07-22 DIAGNOSIS — Z720 Tobacco use: Secondary | ICD-10-CM | POA: Diagnosis present

## 2016-07-22 DIAGNOSIS — Z9221 Personal history of antineoplastic chemotherapy: Secondary | ICD-10-CM | POA: Diagnosis not present

## 2016-07-22 DIAGNOSIS — Z7984 Long term (current) use of oral hypoglycemic drugs: Secondary | ICD-10-CM | POA: Diagnosis not present

## 2016-07-22 DIAGNOSIS — J209 Acute bronchitis, unspecified: Secondary | ICD-10-CM

## 2016-07-22 DIAGNOSIS — J440 Chronic obstructive pulmonary disease with acute lower respiratory infection: Secondary | ICD-10-CM

## 2016-07-22 DIAGNOSIS — J441 Chronic obstructive pulmonary disease with (acute) exacerbation: Secondary | ICD-10-CM | POA: Diagnosis not present

## 2016-07-22 DIAGNOSIS — F419 Anxiety disorder, unspecified: Secondary | ICD-10-CM | POA: Diagnosis present

## 2016-07-22 DIAGNOSIS — F418 Other specified anxiety disorders: Secondary | ICD-10-CM

## 2016-07-22 DIAGNOSIS — E1122 Type 2 diabetes mellitus with diabetic chronic kidney disease: Secondary | ICD-10-CM | POA: Diagnosis present

## 2016-07-22 DIAGNOSIS — Z888 Allergy status to other drugs, medicaments and biological substances status: Secondary | ICD-10-CM

## 2016-07-22 DIAGNOSIS — K219 Gastro-esophageal reflux disease without esophagitis: Secondary | ICD-10-CM | POA: Diagnosis present

## 2016-07-22 DIAGNOSIS — F329 Major depressive disorder, single episode, unspecified: Secondary | ICD-10-CM | POA: Diagnosis present

## 2016-07-22 DIAGNOSIS — F411 Generalized anxiety disorder: Secondary | ICD-10-CM | POA: Diagnosis present

## 2016-07-22 DIAGNOSIS — R05 Cough: Secondary | ICD-10-CM | POA: Diagnosis not present

## 2016-07-22 DIAGNOSIS — B349 Viral infection, unspecified: Secondary | ICD-10-CM | POA: Diagnosis present

## 2016-07-22 DIAGNOSIS — E669 Obesity, unspecified: Secondary | ICD-10-CM | POA: Diagnosis present

## 2016-07-22 DIAGNOSIS — Z923 Personal history of irradiation: Secondary | ICD-10-CM

## 2016-07-22 DIAGNOSIS — I10 Essential (primary) hypertension: Secondary | ICD-10-CM | POA: Diagnosis present

## 2016-07-22 DIAGNOSIS — J449 Chronic obstructive pulmonary disease, unspecified: Secondary | ICD-10-CM | POA: Diagnosis present

## 2016-07-22 DIAGNOSIS — Z0000 Encounter for general adult medical examination without abnormal findings: Secondary | ICD-10-CM

## 2016-07-22 DIAGNOSIS — E119 Type 2 diabetes mellitus without complications: Secondary | ICD-10-CM

## 2016-07-22 HISTORY — DX: Headache: R51

## 2016-07-22 HISTORY — DX: Type 2 diabetes mellitus without complications: E11.9

## 2016-07-22 HISTORY — DX: Malignant neoplasm of unspecified site of right female breast: C50.911

## 2016-07-22 HISTORY — DX: Headache, unspecified: R51.9

## 2016-07-22 LAB — CBC
HEMATOCRIT: 37.3 % (ref 36.0–46.0)
HEMOGLOBIN: 12.1 g/dL (ref 12.0–15.0)
MCH: 28.7 pg (ref 26.0–34.0)
MCHC: 32.4 g/dL (ref 30.0–36.0)
MCV: 88.4 fL (ref 78.0–100.0)
Platelets: 284 10*3/uL (ref 150–400)
RBC: 4.22 MIL/uL (ref 3.87–5.11)
RDW: 13.7 % (ref 11.5–15.5)
WBC: 6.8 10*3/uL (ref 4.0–10.5)

## 2016-07-22 LAB — BASIC METABOLIC PANEL
Anion gap: 10 (ref 5–15)
BUN: 6 mg/dL (ref 6–20)
CHLORIDE: 99 mmol/L — AB (ref 101–111)
CO2: 27 mmol/L (ref 22–32)
Calcium: 8.9 mg/dL (ref 8.9–10.3)
Creatinine, Ser: 0.83 mg/dL (ref 0.44–1.00)
GFR calc non Af Amer: >60 mL/min (ref >60)
Glucose, Bld: 153 mg/dL — ABNORMAL HIGH (ref 65–99)
POTASSIUM: 3.5 mmol/L (ref 3.5–5.1)
SODIUM: 136 mmol/L (ref 135–145)

## 2016-07-22 LAB — TROPONIN I: Troponin I: <0.03 ng/mL (ref <0.03)

## 2016-07-22 MED ORDER — IPRATROPIUM-ALBUTEROL 0.5-2.5 (3) MG/3ML IN SOLN
3.0000 mL | Freq: Four times a day (QID) | RESPIRATORY_TRACT | Status: DC
  Administered 2016-07-22 – 2016-07-23 (×2): 3 mL via RESPIRATORY_TRACT
  Filled 2016-07-22 (×2): qty 3

## 2016-07-22 MED ORDER — METHYLPREDNISOLONE SODIUM SUCC 40 MG IJ SOLR
40.0000 mg | Freq: Once | INTRAMUSCULAR | Status: AC
  Administered 2016-07-22: 40 mg via INTRAMUSCULAR

## 2016-07-22 MED ORDER — METFORMIN HCL 500 MG PO TABS
1000.0000 mg | ORAL_TABLET | Freq: Two times a day (BID) | ORAL | Status: DC
  Administered 2016-07-23 – 2016-07-26 (×7): 1000 mg via ORAL
  Filled 2016-07-22 (×7): qty 2

## 2016-07-22 MED ORDER — PREDNISONE 20 MG PO TABS
40.0000 mg | ORAL_TABLET | Freq: Every day | ORAL | Status: AC
  Administered 2016-07-23 – 2016-07-26 (×4): 40 mg via ORAL
  Filled 2016-07-22 (×4): qty 2

## 2016-07-22 MED ORDER — IPRATROPIUM-ALBUTEROL 0.5-2.5 (3) MG/3ML IN SOLN: 3.0000 mL | Freq: Four times a day (QID) | RESPIRATORY_TRACT | Status: DC

## 2016-07-22 MED ORDER — ENOXAPARIN SODIUM 40 MG/0.4ML ~~LOC~~ SOLN
40.0000 mg | SUBCUTANEOUS | Status: DC
Start: 2016-07-22 — End: 2016-07-26
  Administered 2016-07-22 – 2016-07-25 (×4): 40 mg via SUBCUTANEOUS
  Filled 2016-07-22 (×4): qty 0.4

## 2016-07-22 MED ORDER — LOSARTAN POTASSIUM-HCTZ 100-25 MG PO TABS: 1.0000 | ORAL_TABLET | Freq: Every day | ORAL | Status: DC

## 2016-07-22 MED ORDER — LOSARTAN POTASSIUM 50 MG PO TABS
100.0000 mg | ORAL_TABLET | Freq: Every day | ORAL | Status: DC
Start: 2016-07-23 — End: 2016-07-26
  Administered 2016-07-23 – 2016-07-26 (×4): 100 mg via ORAL
  Filled 2016-07-22 (×4): qty 2

## 2016-07-22 MED ORDER — IPRATROPIUM-ALBUTEROL 0.5-2.5 (3) MG/3ML IN SOLN: 3.0000 mL | RESPIRATORY_TRACT | Status: DC

## 2016-07-22 MED ORDER — MOMETASONE FURO-FORMOTEROL FUM 200-5 MCG/ACT IN AERO
2.0000 | INHALATION_SPRAY | Freq: Two times a day (BID) | RESPIRATORY_TRACT | Status: DC
  Administered 2016-07-22 – 2016-07-26 (×8): 2 via RESPIRATORY_TRACT
  Filled 2016-07-22: qty 8.8

## 2016-07-22 MED ORDER — ASPIRIN EC 81 MG PO TBEC
81.0000 mg | DELAYED_RELEASE_TABLET | Freq: Every day | ORAL | Status: DC
  Administered 2016-07-23 – 2016-07-26 (×4): 81 mg via ORAL
  Filled 2016-07-22 (×4): qty 1

## 2016-07-22 MED ORDER — GUAIFENESIN ER 600 MG PO TB12
600.0000 mg | ORAL_TABLET | Freq: Two times a day (BID) | ORAL | Status: DC
  Administered 2016-07-22 – 2016-07-24 (×4): 600 mg via ORAL
  Filled 2016-07-22 (×4): qty 1

## 2016-07-22 MED ORDER — IPRATROPIUM BROMIDE 0.02 % IN SOLN
0.5000 mg | Freq: Once | RESPIRATORY_TRACT | Status: AC
  Administered 2016-07-22: 0.5 mg via RESPIRATORY_TRACT

## 2016-07-22 MED ORDER — SIMVASTATIN 40 MG PO TABS
40.0000 mg | ORAL_TABLET | Freq: Every day | ORAL | Status: DC
  Administered 2016-07-22 – 2016-07-25 (×4): 40 mg via ORAL
  Filled 2016-07-22 (×4): qty 1

## 2016-07-22 MED ORDER — ALBUTEROL SULFATE (2.5 MG/3ML) 0.083% IN NEBU
3.0000 mL | INHALATION_SOLUTION | Freq: Four times a day (QID) | RESPIRATORY_TRACT | Status: DC | PRN
  Administered 2016-07-24: 3 mL via RESPIRATORY_TRACT
  Filled 2016-07-22: qty 3

## 2016-07-22 MED ORDER — HYDROCHLOROTHIAZIDE 25 MG PO TABS
25.0000 mg | ORAL_TABLET | Freq: Every day | ORAL | Status: DC
  Administered 2016-07-23 – 2016-07-26 (×4): 25 mg via ORAL
  Filled 2016-07-22 (×4): qty 1

## 2016-07-22 MED ORDER — ACETAMINOPHEN 325 MG PO TABS
650.0000 mg | ORAL_TABLET | Freq: Four times a day (QID) | ORAL | Status: DC | PRN
  Administered 2016-07-23 – 2016-07-26 (×6): 650 mg via ORAL
  Filled 2016-07-22 (×6): qty 2

## 2016-07-22 MED ORDER — DEXTROSE 5 % IV SOLN
1.0000 g | INTRAVENOUS | Status: DC
  Administered 2016-07-22: 1 g via INTRAVENOUS
  Filled 2016-07-22 (×2): qty 10

## 2016-07-22 MED ORDER — METOPROLOL TARTRATE 25 MG PO TABS
25.0000 mg | ORAL_TABLET | Freq: Two times a day (BID) | ORAL | Status: DC
  Administered 2016-07-22 – 2016-07-26 (×8): 25 mg via ORAL
  Filled 2016-07-22 (×8): qty 1

## 2016-07-22 MED ORDER — ALBUTEROL SULFATE (2.5 MG/3ML) 0.083% IN NEBU
2.5000 mg | INHALATION_SOLUTION | Freq: Once | RESPIRATORY_TRACT | Status: AC
  Administered 2016-07-22: 2.5 mg via RESPIRATORY_TRACT

## 2016-07-22 MED ORDER — BUPROPION HCL ER (XL) 150 MG PO TB24
150.0000 mg | ORAL_TABLET | Freq: Every day | ORAL | Status: DC
  Administered 2016-07-23 – 2016-07-24 (×2): 150 mg via ORAL
  Filled 2016-07-22 (×2): qty 1

## 2016-07-22 MED ORDER — LORATADINE 10 MG PO TABS: 10.0000 mg | ORAL_TABLET | Freq: Every day | ORAL | Status: DC

## 2016-07-22 MED ORDER — GUAIFENESIN-CODEINE 100-10 MG/5ML PO SOLN
5.0000 mL | Freq: Four times a day (QID) | ORAL | Status: DC | PRN
  Administered 2016-07-22 – 2016-07-24 (×4): 5 mL via ORAL
  Filled 2016-07-22 (×5): qty 5

## 2016-07-22 MED ORDER — SALINE SPRAY 0.65 % NA SOLN
1.0000 | NASAL | Status: DC | PRN
  Filled 2016-07-22: qty 44

## 2016-07-22 NOTE — Progress Notes (Addendum)
CC: Cough, shortness of breath, and increased sputum production  HPI:  [name] is a 65 y.o. female with PMHx detailed below presenting with worsening shortness of breath, wheezing, congestion, increased sputum production, sputum previously clear now opaque/white since Monday of this week. Overall she endorses significant worsening of her COPD symptoms over the last two months which began with a bad cold. Over that time she has visited urgent care and our clinic and received prescriptions that transiently help her symptoms - but her overall trajectory has been worsening. She was seen in urgent care on 8/24 and treated with Augmentin and Prednisone for sinusitis and COP exacerbation. She visited our clinic on 9/5 reporting ongoing/worsening symptoms - concern for not taking inhaler steroid - received Prednisone, Azithromycin, started on Claritin and Advair. She returns today with worsening of her previous symptoms despite taking the prescribed medications and inhalers. She has been taking her nebulizers 3 times daily with only temporary symptom relief. She states self-knowledge of numerous COPD exacerbations and hospitalizations in the past and is confident in her sense of when she is "in trouble" and that she "is in trouble now." She reports ongoing sinus congestion, subjective fevers/chills, anxiety, and chest soreness and headache from coughing. She is able to eat and drink and has been ambulatory but must stop to rest often. Unfortunately she continues to smoke 3/4 pack cigarettes / day.  See problem based assessment and plan below for additional details.  Past Medical History:  Diagnosis Date  . Anxiety   . Asthma   . Breast cancer ([place]) 1991   s/p lumpectomy, chemotherapy and radiation therapy in 1991. Mammogram in 2007 was normal.  . Bronchitis    h/o  . Constipated    h/o  . COPD (chronic obstructive pulmonary disease) ([place])    History of multiple hospital admissions for  exercabation   . COPD with exacerbation ([place]) 04/06/2009   Qualifier: Diagnosis of  By: Eyvonne Mechanic MD, Vijay    . Depression   . Diabetes mellitus   . Diarrhea    h/o  . GERD (gastroesophageal reflux disease)   . Heart murmur 10/05/11   "first time I ever heard I had one was today"  . Hyperlipidemia   . Hypertension   . Obesity   . Pneumonia   . Shortness of breath 10/05/11   "at rest; lying down; w/exertion"  . Sigmoid diverticulitis 80/2008  . Tobacco abuse    Review of Systems: Review of Systems  Constitutional: Positive for chills, fever and malaise/fatigue.  HENT: Positive for congestion.   Respiratory: Positive for cough, sputum production, shortness of breath and wheezing. Negative for hemoptysis.   Cardiovascular: Positive for chest pain. Negative for leg swelling.  Gastrointestinal: Negative for abdominal pain.  Neurological: Positive for headaches.     Physical Exam: Vitals:   07/22/16 1334  BP: 126/61  Pulse: 89  Temp: 99.1 F (37.3 C)  TempSrc: Oral  SpO2: 100%  Weight: 196 lb 3.2 oz (89 kg)  Height: 5\' 7"  (1.702 m)   Body mass index is 30.73 kg/m. GENERAL- Well-dressed woman curled up under blanket on exam table, tired-appearing, in mild distress, speaks haltingly in 5-10 word sentences HEENT- Atraumatic, PERRL, EOMI, moist mucous membranes, moon face, sinuses non-tender to palpation, no cervical lymphadenopathy CARDIAC- Regular rate and rhythm, no murmurs, rubs or gallops. RESP- Audibly wheezing in room, tachypneic, diffuse expiratory wheezing bilaterally to auscultation, prolonged expiratory phase, some accessory respiratory muscle use, no audible rhonchi or crackles  ABDOMEN- Normoactive bowel sounds, soft, nontender EXTREMITIES- Normal bulk and range of motion, no edema, 2+ peripheral pulses SKIN- Warm, dry, intact, without visible rash PSYCH- Blunted affect, halting speech, thoughts linear and goal-directed  Assessment & Plan:   See encounters tab  for problem based medical decision making.  Patient seen with Dr. Lynnae January

## 2016-07-22 NOTE — Discharge Summary (Signed)
Name: [name] MRN: [id number] DOB: 1951-02-10 65 y.o. PCP: Christie Partridge, MD  Date of Admission: 07/22/2016  5:51 PM Date of Discharge: 07/26/2016 Attending Physician: Christie Filler, MD  Discharge Diagnosis:  1. COPD Exacerbation  Principal Problem:   COPD exacerbation ([place]) Active Problems:   DM2 (diabetes mellitus, type 2) ([place])   TOBACCO ABUSE   Depression with generalized anxiety   Hypertension, benign essential, goal below 140/90   Preventative health care   Dyslipidemia associated with type 2 diabetes mellitus (HCC)   COPD, frequent exacerbations ([place])   Discharge Medications:   Medication List    TAKE these medications   ACCU-CHEK FASTCLIX LANCETS Misc 1 each by Other route See admin instructions. Check blood sugar daily as needed for high blood sugar.   ACCU-CHEK NANO SMARTVIEW w/Device Kit 1 each by Other route See admin instructions. Check blood sugar daily as needed for high blood sugar.   acetaminophen 325 MG tablet Commonly known as:  TYLENOL Take 650 mg by mouth 2 (two) times daily as needed for headache.   albuterol 108 (90 Base) MCG/ACT inhaler Commonly known as:  PROVENTIL HFA;VENTOLIN HFA Inhale 2 puffs into the lungs every 6 (six) hours as needed for wheezing or shortness of breath.   aspirin 81 MG EC tablet Take 1 tablet (81 mg total) by mouth daily.   buPROPion 300 MG 24 hr tablet Commonly known as:  WELLBUTRIN XL Take 1 tablet (300 mg total) by mouth daily. What changed:  medication strength  how much to take  additional instructions   cetirizine 10 MG tablet Commonly known as:  ZYRTEC Take 10 mg by mouth daily.   citalopram 10 MG tablet Commonly known as:  CELEXA Take 1 tablet (10 mg total) by mouth daily.   Fluticasone-Salmeterol 250-50 MCG/DOSE Aepb Commonly known as:  ADVAIR DISKUS Inhale 1 puff into the lungs 2 (two) times daily.   glucose blood test strip Commonly known as:  ACCU-CHEK SMARTVIEW Use  to check blood sugar 1 to 2 times daily. diag code E 11.9. Non- insulin dependent   guaiFENesin 600 MG 12 hr tablet Commonly known as:  MUCINEX Take 2 tablets (1,200 mg total) by mouth 2 (two) times daily.   HYDROcodone-homatropine 5-1.5 MG/5ML syrup Commonly known as:  HYCODAN Take 5 mLs by mouth every 6 (six) hours as needed for cough.   ipratropium-albuterol 0.5-2.5 (3) MG/3ML Soln Commonly known as:  DUONEB Take 3 mLs by nebulization every 6 (six) hours as needed.   losartan-hydrochlorothiazide 100-25 MG tablet Commonly known as:  HYZAAR Take 1 tablet by mouth daily. Cancel previous Rx sent for 30-day supply   metFORMIN 1000 MG tablet Commonly known as:  GLUCOPHAGE Take 1 tablet (1,000 mg total) by mouth 2 (two) times daily.   metoprolol 50 MG tablet Commonly known as:  LOPRESSOR Take 0.5 tablets (25 mg total) by mouth 2 (two) times daily.   simvastatin 40 MG tablet Commonly known as:  ZOCOR Take 1 tablet (40 mg total) by mouth daily at 6 PM.   sodium chloride 0.65 % Soln nasal spray Commonly known as:  OCEAN Place 1 spray into both nostrils as needed for congestion.   tiotropium 18 MCG inhalation capsule Commonly known as:  SPIRIVA HANDIHALER Place 1 capsule (18 mcg total) into inhaler and inhale daily.       Disposition and follow-up:   [name] was discharged from [facility] in Good condition.  At the hospital follow  up visit please address:  1.  COPD.  Assess dyspnea, cough, and her controller meds.   2.  Tobacco abuse.  Please encourage tobacco cessation and support her efforts.  3.  Anxiety.  Bupropion dose increased and started Citalopram for anxiety.  Follow-up on mood.  4.  Labs / imaging needed at time of follow-up: consider screening chest CT if cough persists  5.  Pending labs/ test needing follow-up: none  Follow-up Appointments: Follow-up Information    [place]. Go on 07/29/2016.     Why:  2:15 PM Contact information: [address] [place] Hospital Course by problem list: Principal Problem:   COPD exacerbation ([place]) Active Problems:   DM2 (diabetes mellitus, type 2) ([place])   TOBACCO ABUSE   Depression with generalized anxiety   Hypertension, benign essential, goal below 140/90   Preventative health care   Dyslipidemia associated with type 2 diabetes mellitus (HCC)   COPD, frequent exacerbations ([place])   1. COPD Exacerbation [name] was admitted from clinic with persistent dyspnea and wheezing after failing outpatient management for a COPD exacerbation with 5 days of steroids and antibiotics.  She has had several months of worsening dyspnea, wheezing, and cough with minimal improvement with steroids.  She continues to smoke almost a pack a day.  In clinic, she was saturating 100% on RA but with marked wheezing and labored breathing.  She was given 40 mg IV solumedrol in clinic, then finished a 5 day course of steroids with 40 mg PO prednisone daily, and was also give 5 days of antibiotics (ceftriaxone once, then azithromycin).  Her wheezing improved and she was able walk without dyspnea.  Her most troublesome and persistent symptom is paroxysmal coughing, which was mildly improved with opiate cough suppressants and expectorants.  She was reluctant to be discharged, stating that she did not feel well and that she thought she might have to come right back to the hospital.  We discussed that oxygen desaturations and difficulty breathing are indications of hospitalization, but that her cough is not, and that she could continue on the same symptomatic treatments at home.  The importance of smoking cessation was emphasized, as well as the fact that her COPD is likely worsening.  Close follow-up in [facility] clinic arranged for her.  2. Anxiety She reports that troubles breathing exacerbates her baseline anxiety.  She has an anxious  and constricted affect, and anxiety about her chronic medical condition, in addition to worsening COPD, may be contributing to her repeated admissions.  He bupropion dose was increased for depression and tobacco cessation, and she was started on low dose citalopram for anxiety.  Discharge Vitals:   BP 125/80 (BP Location: Left Arm)   Pulse 71   Temp 98.2 F (36.8 C) (Oral)   Resp 18   Ht '5\' 7"'  (1.702 m)   Wt 194 lb 7.1 oz (88.2 kg)   SpO2 100%   BMI 30.45 kg/m   Pertinent Labs, Studies, and Procedures:   Chest Radiographs (07/22/16): No active cardiopulmonary disease  Discharge Instructions: Discharge Instructions    Diet - low sodium heart healthy    Complete by:  As directed    Increase activity slowly    Complete by:  As directed      You were admitted to hospital for worsening of your COPD.  We treated you with steroids, antibiotics, and breathing treatments and your  breathing improved, with your oxygen levels staying at 100% even while walking.  We did not find any evidence of pneumonia or any other dangerous causes for your shortness of breath.  Your cough is likely related to your COPD and smoking.  If you continue smoking, both your cough and shortness of breath will get worse.  While in the hospital, you were given medicines to help you cough up the mucous in your lungs and also to suppress your cough.  I have given you prescriptions to continue these medicines.  You have an appointment to follow-up in clinic this Thursday to check on your breathing.  Please come back to the ED if you have worse wheezing, shortness of breath, or cough up blood, or if you have chest pain.  Signed: Minus Liberty, MD 07/26/2016, 11:27 AM   Pager: [phone]

## 2016-07-22 NOTE — Progress Notes (Signed)
Report called to Nurse on [address].  Patient transported via wheelchair to [place] [facility].  Alert and oriented.  Sander Nephew, RN 07/22/2016 5:39 PM

## 2016-07-22 NOTE — Assessment & Plan Note (Signed)
>>  ASSESSMENT AND PLAN FOR CHRONIC OBSTRUCTIVE PULMONARY DISEASE WITH BRONCHOSPASM ([place]) WRITTEN ON 07/22/2016  9:56 PM BY Wynetta Emery, ADAM, MD  Presents today with worsening shortness of breath, wheezing, congestion, increased sputum production, sputum previously clear now opaque/white since Monday of this week. Overall she endorses significant worsening of her COPD symptoms over the last two months which began with a bad cold. Seen last week in our clinic (on 9/5) and was then reporting similar symptoms - concern for not taking inhaled steroid - received Prednisone, Azithromycin, started on Claritin and Advair. Today she has worsening of her previous symptoms despite taking the prescribed medications and inhalers. She has required her nebulizers 3 times daily with only temporary symptom relief. She states self-knowledge of numerous COPD exacerbations and hospitalizations in the past and is confident in her sense of when she is "in trouble" and that she "is in trouble now." She reports ongoing sinus congestion, subjective fevers/chills, anxiety, and chest soreness and headache from coughing. She has been to eat and drink and has been ambulatory but must stop to rest often. She continues to smoke 3/4 pack cigarettes daily. On exam she is hemodynamically stable with O2 sat in high 90s on room air but tachypneic with diffuse bilateral wheezing throughout. At this point her COPD exacerbation has failed outpatient management (several courses of steroids, antibiotics, proper inhaler regimen) and she would benefit from inpatient treatment to break this pattern.   Assessment: COPD exacerbation  Plan: - Admit to inpatient service, telemetry - Will provide Duoneb x1,Solumedrol 40mg  IV, place IV in clinic pending admission - Will require Solumedrol vs po prednisone, Q4-Q6 duonebs - spacing as tolerated after 24h, CXR, EKG, continue Rx for allergies, PPI, Rx for congestion relief, Tylenol, consider antibiotics, may require  nicotine patch

## 2016-07-22 NOTE — H&P (Signed)
Date: 07/22/2016               Patient Name:  [name] MRN: [id number]  DOB: November 11, 1950 Age / Sex: 65 y.o., female   PCP: Asencion Partridge, MD         Medical Service: Internal Medicine Teaching Service         Attending Physician: Dr. Axel Filler, MD    First Contact: Dr. Inda Castle Pager: [phone]  Second Contact: Dr. Quay Burow Pager: [phone]       After Hours (After 5p/  First Contact Pager: [phone]  weekends / holidays): Second Contact Pager: [phone]   Chief Complaint: shortness of breath  History of Present Illness: [name] is a 65 year old woman with history of COPD, tobacco abuse, HTN, T2DM, and HL who presented to the [facility] clinic with persistent shortness of breath and wheezing.   Patient states she has been feeling more short of breath for the past 2 months. It started with "cold" symptoms including nasal congestion, sinus pressure and pain, chest pain from coughing, and a hacking productive cough of white sputum. It was previously clear, but transitioning to white. She admits to subjective fevers and chills.  On 8/24, she went to urgent care with these symptoms and was treated with a course of Augmentin and Prednisone for sinusitis and COPD exacerbation. Her symptpoms did not improve, and on 9/5 she was seen in the [facility] clinic and prescribed Azithromycin, Prednisone, Claritin and Advair.   Since that visit one week ago, patient and husband feel that her breathing has worsened in addition to the wheezing. She has been using her albuterol, Spiriva, and Advair. She has been using her nebulizers three times per day which seems to help some for a while, but her symptoms return. Patient was lasted admitted for a COPD exacerbation one year ago. She has been admitted 7 times in the last 2 years.   She admits to increased urination for the past two weeks. She denies associated dysuria or hematuria. She denies nausea, vomiting, abdominal pain. She is eating and  drinking well- about the same as normal.   Meds: Current Facility-Administered Medications  Medication Dose Route Frequency Provider Last Rate Last Dose  . albuterol (PROVENTIL HFA;VENTOLIN HFA) 108 (90 Base) MCG/ACT inhaler 2 puff  2 puff Inhalation Q6H PRN Alexa Angela Burke, MD      . Derrill Memo ON 07/23/2016] aspirin EC tablet 81 mg  81 mg Oral Daily Alexa Angela Burke, MD      . Derrill Memo ON 07/23/2016] buPROPion (WELLBUTRIN XL) 24 hr tablet 150 mg  150 mg Oral Daily Alexa R Burns, MD      . enoxaparin (LOVENOX) injection 40 mg  40 mg Subcutaneous Q24H Alexa R Burns, MD      . guaiFENesin (MUCINEX) 12 hr tablet 600 mg  600 mg Oral BID Alexa R Burns, MD      . guaiFENesin-codeine 100-10 MG/5ML solution 5 mL  5 mL Oral Q6H PRN Alexa R Burns, MD      . ipratropium-albuterol (DUONEB) 0.5-2.5 (3) MG/3ML nebulizer solution 3 mL  3 mL Nebulization Q6H Alexa R Burns, MD      . ipratropium-albuterol (DUONEB) 0.5-2.5 (3) MG/3ML nebulizer solution 3 mL  3 mL Nebulization Q6H Alexa R Burns, MD      . loratadine (CLARITIN) tablet 10 mg  10 mg Oral Daily Alexa Angela Burke, MD      . Derrill Memo ON 07/23/2016] losartan-hydrochlorothiazide (HYZAAR) 100-25  MG per tablet 1 tablet  1 tablet Oral Daily Alexa R Burns, MD      . metFORMIN (GLUCOPHAGE) tablet 1,000 mg  1,000 mg Oral BID Alexa Angela Burke, MD      . metoprolol tartrate (LOPRESSOR) tablet 25 mg  25 mg Oral BID Alexa Angela Burke, MD      . mometasone-formoterol (DULERA) 200-5 MCG/ACT inhaler 2 puff  2 puff Inhalation BID Alexa Angela Burke, MD      . Derrill Memo ON 07/23/2016] predniSONE (DELTASONE) tablet 40 mg  40 mg Oral Q breakfast Alexa R Quay Burow, MD      . Derrill Memo ON 07/23/2016] simvastatin (ZOCOR) tablet 40 mg  40 mg Oral q1800 Alexa R Burns, MD      . sodium chloride (OCEAN) 0.65 % nasal spray 1 spray  1 spray Each Nare PRN Florinda Marker, MD        Allergies: Allergies as of 07/22/2016 - Review Complete 07/22/2016  Allergen Reaction Noted  . Ace inhibitors Swelling 12/26/2014  . Flonase  [fluticasone] Other (See Comments) 12/25/2014   Past Medical History:  Diagnosis Date  . Anxiety   . Asthma   . Breast cancer ([place]) 1991   s/p lumpectomy, chemotherapy and radiation therapy in 1991. Mammogram in 2007 was normal.  . Bronchitis    h/o  . Constipated    h/o  . COPD (chronic obstructive pulmonary disease) ([place])    History of multiple hospital admissions for exercabation   . COPD with exacerbation ([place]) 04/06/2009   Qualifier: Diagnosis of  By: Eyvonne Mechanic MD, Vijay    . Depression   . Diabetes mellitus   . Diarrhea    h/o  . GERD (gastroesophageal reflux disease)   . Heart murmur 10/05/11   "first time I ever heard I had one was today"  . Hyperlipidemia   . Hypertension   . Obesity   . Pneumonia   . Shortness of breath 10/05/11   "at rest; lying down; w/exertion"  . Sigmoid diverticulitis 80/2008  . Tobacco abuse     Family History:  Mother: Breast Cancer  Social History:  Tobacco Use: 2/3 pack per day for 40 years Alcohol Use: Rare Illicit Drug Use: Denies  Review of Systems: A complete ROS was negative except as per HPI.   Physical Exam: There were no vitals taken for this visit.  Physical Exam  Constitutional: She is oriented to person, place, and time.  Tired appearing woman sitting up and resting the side of her head on her shoulder  HENT:  Head: Normocephalic and atraumatic.  Mouth/Throat: Oropharynx is clear and moist.  Cardiovascular: Normal rate and regular rhythm.   Pulmonary/Chest:  No respiratory distress, tachypnea, or accessory muscle usage Diffuse wheezes in all lung fields  Abdominal: Soft. She exhibits no distension. There is no tenderness.  Musculoskeletal: She exhibits no edema or tenderness.  Neurological: She is alert and oriented to person, place, and time.  Skin: Skin is warm and dry.  Psychiatric:  Mood depressed, frustrated, affect congruent Became tearful when asked about code status     Assessment & Plan by  Problem: Principal Problem:   COPD exacerbation ([place]) Active Problems:   DM2 (diabetes mellitus, type 2) ([place])   TOBACCO ABUSE   Depression with generalized anxiety   Hypertension, benign essential, goal below 140/90   Preventative health care   Dyslipidemia associated with type 2 diabetes mellitus (HCC)   COPD, frequent exacerbations ([place])  #Dyspnea #COPD Exacerbation With her history  of COPD, wheezing, and productive cough, her dyspnea is likely due to a COPD exacerbation.  She does have several cardiac risk factors (HTN, DM, HLD, tobacco) and increased risk of CAD (29.1% ASCVD), so we will rule out ACS despite low suspicion of CAD causing her symptoms.  Pneumonia is also possible, though she is afebrile.  Will begin treating COPD exacerbation with bronchodilators, steroids, and antibiotics since she has failed outpatient management.  -CXR -EKG -Troponin -CBC -Duonebs Q6H -Prednisone 40 mg daily (5 days total, end 9/18) -Ceftriaxone IV -Flonase QD -Mucinex 600 mg BID -Robitussin DM   #Polyuria Without dysuria or hematuria. -Urinalysis  #HTN Normotensive. -Continue home losartan-HCTZ 100-25 daily and metoprolol 25 mg BID -BMP  #T2DM   Last A1c 6.3 on 03/01/2016. -Continue home metformin 1000 mg BID  #HLD -Continue home simvastatin 40 mg daily   #Depression:  -Continue home bupropion 150 mg daily   #Tobacco Abuse: Current 2/3 ppd smoker with 40 pack year history.  -Counseled on smoking cessation  DVT/PE ppx: Lovenox SQ QD FEN: CM CODE: Full  Dispo: Admit patient to Inpatient with expected length of stay greater than 2 midnights.  Signed: Minus Liberty, MD 07/22/2016, 3:53 PM  Pager: [phone]

## 2016-07-22 NOTE — Telephone Encounter (Signed)
Patient called this morning reporting increased coughing, shortness of breath, and sputum production (loose, clear). Patient denies chest pain, fever, and other symptoms of concern.  She states she took ipratropium-albuterol (Duoneb) and OTC Dayquil this morning. Patient was advised to take 2 puffs (up to 6 puffs) of albuterol and contact EMS if symptoms become severe. [facility] appointment has been scheduled for further evaluation this afternoon at 1:15pm.   Patient verbalized understanding of information discussed by repeat-back.

## 2016-07-22 NOTE — Assessment & Plan Note (Signed)
Presents today with worsening shortness of breath, wheezing, congestion, increased sputum production, sputum previously clear now opaque/white since Monday of this week. Overall she endorses significant worsening of her COPD symptoms over the last two months which began with a bad cold. Seen last week in our clinic (on 9/5) and was then reporting similar symptoms - concern for not taking inhaled steroid - received Prednisone, Azithromycin, started on Claritin and Advair. Today she has worsening of her previous symptoms despite taking the prescribed medications and inhalers. She has required her nebulizers 3 times daily with only temporary symptom relief. She states self-knowledge of numerous COPD exacerbations and hospitalizations in the past and is confident in her sense of when she is "in trouble" and that she "is in trouble now." She reports ongoing sinus congestion, subjective fevers/chills, anxiety, and chest soreness and headache from coughing. She has been to eat and drink and has been ambulatory but must stop to rest often. She continues to smoke 3/4 pack cigarettes daily. On exam she is hemodynamically stable with O2 sat in high 90s on room air but tachypneic with diffuse bilateral wheezing throughout. At this point her COPD exacerbation has failed outpatient management (several courses of steroids, antibiotics, proper inhaler regimen) and she would benefit from inpatient treatment to break this pattern.   Assessment: COPD exacerbation  Plan: - Admit to inpatient service, telemetry - Will provide Duoneb x1,Solumedrol 40mg  IV, place IV in clinic pending admission - Will require Solumedrol vs po prednisone, Q4-Q6 duonebs - spacing as tolerated after 24h, CXR, EKG, continue Rx for allergies, PPI, Rx for congestion relief, Tylenol, consider antibiotics, may require nicotine patch

## 2016-07-22 NOTE — Progress Notes (Signed)
IV team has been called to start saline lock.

## 2016-07-22 NOTE — Progress Notes (Signed)
Pt assess for saline lock - poor vein access; hx right breast cancer also.Will have another RN to evaluate.

## 2016-07-22 NOTE — Progress Notes (Signed)
Admission note:  Arrival Method: Patient direct admit from Internal medicine clinic. Mental Orientation:  Alert and oriented x 4. Telemetry: no orders yet. Assessment: see doc flow sheets. Skin: Intact, dry and warm IV: Left hand SL. Pain: 8/10, c/o pain in the chest and head. Tubes: N/A Safety Measures: bed in low position, call bell and phone within reach. Fall Prevention Safety Plan: Reviewed the plan, understood and acknowledged. Admission Screening: In progress. 6700 Orientation: Patient has been oriented to the unit, staff and to the room.

## 2016-07-23 DIAGNOSIS — E119 Type 2 diabetes mellitus without complications: Secondary | ICD-10-CM

## 2016-07-23 DIAGNOSIS — Z7984 Long term (current) use of oral hypoglycemic drugs: Secondary | ICD-10-CM

## 2016-07-23 DIAGNOSIS — Z79899 Other long term (current) drug therapy: Secondary | ICD-10-CM

## 2016-07-23 DIAGNOSIS — J441 Chronic obstructive pulmonary disease with (acute) exacerbation: Principal | ICD-10-CM

## 2016-07-23 DIAGNOSIS — I10 Essential (primary) hypertension: Secondary | ICD-10-CM

## 2016-07-23 LAB — URINALYSIS, ROUTINE W REFLEX MICROSCOPIC
Bilirubin Urine: NEGATIVE
Glucose, UA: >1000 mg/dL — AB
Hgb urine dipstick: NEGATIVE
Ketones, ur: NEGATIVE mg/dL
LEUKOCYTES UA: NEGATIVE
Nitrite: NEGATIVE
PROTEIN: NEGATIVE mg/dL
Specific Gravity, Urine: 1.016 (ref 1.005–1.030)
pH: 6 (ref 5.0–8.0)

## 2016-07-23 LAB — GLUCOSE, CAPILLARY
GLUCOSE-CAPILLARY: 282 mg/dL — AB (ref 65–99)
Glucose-Capillary: 198 mg/dL — ABNORMAL HIGH (ref 65–99)
Glucose-Capillary: 220 mg/dL — ABNORMAL HIGH (ref 65–99)
Glucose-Capillary: 259 mg/dL — ABNORMAL HIGH (ref 65–99)

## 2016-07-23 LAB — CBC
HCT: 34.6 % — ABNORMAL LOW (ref 36.0–46.0)
HEMOGLOBIN: 11.3 g/dL — AB (ref 12.0–15.0)
MCH: 28.6 pg (ref 26.0–34.0)
MCHC: 32.7 g/dL (ref 30.0–36.0)
MCV: 87.6 fL (ref 78.0–100.0)
PLATELETS: 264 10*3/uL (ref 150–400)
RBC: 3.95 MIL/uL (ref 3.87–5.11)
RDW: 13.7 % (ref 11.5–15.5)
WBC: 7.8 10*3/uL (ref 4.0–10.5)

## 2016-07-23 LAB — URINE MICROSCOPIC-ADD ON
RBC / HPF: NONE SEEN RBC/hpf (ref 0–5)
WBC, UA: NONE SEEN WBC/hpf (ref 0–5)

## 2016-07-23 LAB — BASIC METABOLIC PANEL
ANION GAP: 12 (ref 5–15)
BUN: 9 mg/dL (ref 6–20)
CALCIUM: 8.4 mg/dL — AB (ref 8.9–10.3)
CHLORIDE: 96 mmol/L — AB (ref 101–111)
CO2: 24 mmol/L (ref 22–32)
CREATININE: 0.87 mg/dL (ref 0.44–1.00)
GFR calc non Af Amer: >60 mL/min (ref >60)
Glucose, Bld: 274 mg/dL — ABNORMAL HIGH (ref 65–99)
Potassium: 3.8 mmol/L (ref 3.5–5.1)
SODIUM: 132 mmol/L — AB (ref 135–145)

## 2016-07-23 MED ORDER — GUAIFENESIN-CODEINE 100-10 MG/5ML PO SOLN
5.0000 mL | Freq: Once | ORAL | Status: AC
  Administered 2016-07-23: 5 mL via ORAL

## 2016-07-23 MED ORDER — INSULIN ASPART 100 UNIT/ML ~~LOC~~ SOLN
0.0000 [IU] | Freq: Every day | SUBCUTANEOUS | Status: DC
  Administered 2016-07-23: 3 [IU] via SUBCUTANEOUS

## 2016-07-23 MED ORDER — IPRATROPIUM-ALBUTEROL 0.5-2.5 (3) MG/3ML IN SOLN
3.0000 mL | Freq: Four times a day (QID) | RESPIRATORY_TRACT | Status: DC
  Administered 2016-07-24 – 2016-07-26 (×10): 3 mL via RESPIRATORY_TRACT
  Filled 2016-07-23 (×10): qty 3

## 2016-07-23 MED ORDER — BENZONATATE 100 MG PO CAPS
100.0000 mg | ORAL_CAPSULE | Freq: Three times a day (TID) | ORAL | Status: DC | PRN
  Administered 2016-07-25: 100 mg via ORAL
  Filled 2016-07-23 (×2): qty 1

## 2016-07-23 MED ORDER — IPRATROPIUM-ALBUTEROL 0.5-2.5 (3) MG/3ML IN SOLN
3.0000 mL | Freq: Three times a day (TID) | RESPIRATORY_TRACT | Status: DC
  Administered 2016-07-23: 3 mL via RESPIRATORY_TRACT
  Filled 2016-07-23: qty 3

## 2016-07-23 MED ORDER — KETOROLAC TROMETHAMINE 30 MG/ML IJ SOLN
30.0000 mg | Freq: Once | INTRAMUSCULAR | Status: AC
Start: 2016-07-23 — End: 2016-07-23
  Administered 2016-07-23: 30 mg via INTRAVENOUS
  Filled 2016-07-23: qty 1

## 2016-07-23 MED ORDER — IPRATROPIUM-ALBUTEROL 0.5-2.5 (3) MG/3ML IN SOLN
3.0000 mL | RESPIRATORY_TRACT | Status: DC
  Administered 2016-07-23 (×3): 3 mL via RESPIRATORY_TRACT
  Filled 2016-07-23 (×3): qty 3

## 2016-07-23 MED ORDER — KETOROLAC TROMETHAMINE 10 MG PO TABS
10.0000 mg | ORAL_TABLET | Freq: Once | ORAL | Status: AC | PRN
  Administered 2016-07-23: 10 mg via ORAL
  Filled 2016-07-23: qty 1

## 2016-07-23 MED ORDER — AZITHROMYCIN 500 MG PO TABS
250.0000 mg | ORAL_TABLET | Freq: Every day | ORAL | Status: DC
  Administered 2016-07-24 – 2016-07-26 (×3): 250 mg via ORAL
  Filled 2016-07-23 (×3): qty 1

## 2016-07-23 MED ORDER — DIPHENHYDRAMINE HCL 50 MG/ML IJ SOLN
25.0000 mg | Freq: Once | INTRAMUSCULAR | Status: AC
  Administered 2016-07-23: 25 mg via INTRAVENOUS
  Filled 2016-07-23: qty 1

## 2016-07-23 MED ORDER — PROCHLORPERAZINE EDISYLATE 5 MG/ML IJ SOLN
10.0000 mg | Freq: Once | INTRAMUSCULAR | Status: AC
  Administered 2016-07-23: 10 mg via INTRAVENOUS
  Filled 2016-07-23: qty 2

## 2016-07-23 MED ORDER — AZITHROMYCIN 500 MG PO TABS
500.0000 mg | ORAL_TABLET | Freq: Every day | ORAL | Status: AC
  Administered 2016-07-23: 500 mg via ORAL
  Filled 2016-07-23: qty 1

## 2016-07-23 MED ORDER — INSULIN ASPART 100 UNIT/ML ~~LOC~~ SOLN: 0.0000 [IU] | Freq: Three times a day (TID) | SUBCUTANEOUS | Status: DC

## 2016-07-23 MED ORDER — IBUPROFEN 400 MG PO TABS
400.0000 mg | ORAL_TABLET | Freq: Four times a day (QID) | ORAL | Status: DC | PRN
  Administered 2016-07-23 – 2016-07-26 (×7): 400 mg via ORAL
  Filled 2016-07-23 (×7): qty 1

## 2016-07-23 MED ORDER — RAMELTEON 8 MG PO TABS
8.0000 mg | ORAL_TABLET | Freq: Every day | ORAL | Status: DC
  Administered 2016-07-23 – 2016-07-25 (×3): 8 mg via ORAL
  Filled 2016-07-23 (×4): qty 1

## 2016-07-23 NOTE — Progress Notes (Signed)
   Subjective: Her breathing is somewhat improved, but she had multiple coughing spells associated with breathlessness and headache overnight.  Was able to get up, shower, and do some organizing in her room this morning.  Expressed understanding that quitting smoking is the most important thing she can do for her health.  Objective:  Vital signs in last 24 hours: Vitals:   07/22/16 2248 07/23/16 0216 07/23/16 0427 07/23/16 0939  BP:   (!) 141/55 (!) 145/68  Pulse:   73 97  Resp:   16 18  Temp:   98.2 F (36.8 C) 97.8 F (36.6 C)  TempSrc:   Oral Oral  SpO2:  98% 100% 100%  Weight:      Height: 5\' 7"  (1.702 m)      Physical Exam  Constitutional: She is oriented to person, place, and time. She appears well-developed and well-nourished.  In no distress with nasal canula in place  Cardiovascular: Normal rate and regular rhythm.   Pulmonary/Chest:  No respiratory distress, moderate diffuse wheezes  Neurological: She is alert and oriented to person, place, and time.  Skin: Skin is warm and dry.  Psychiatric: She has a normal mood and affect. Her behavior is normal.   Chest Radiographs (07/22/16): no consolidation or pneumonia  Assessment/Plan:  Principal Problem:   COPD exacerbation ([place]) Active Problems:   DM2 (diabetes mellitus, type 2) ([place])   TOBACCO ABUSE   Depression with generalized anxiety   Hypertension, benign essential, goal below 140/90   Preventative health care   Dyslipidemia associated with type 2 diabetes mellitus (HCC)   COPD, frequent exacerbations (HCC)  #COPD Exacerbation vs Progressive COPD With multiple recent courses of steroid and antibiotics at outpatient for COPD exacerbation, this may represent progression of her COPD.  Will treat for COPD exacerbation again with steroids and antibiotics, and consider revising her controller meds.  Options include increasing Advair dose and adding chronic macrolide.  Last PFTs (06/23/15) GOLD 2. -Duonebs  Q4H -Prednisone 40 mg daily -D/c ceftriaxone with no evidence of pneumonia -Azithromycin for 5 days -Dulera, Spiriva -Symptomatic treatment with guaifenisin and codeine -Consider op pulmonology referral  #HTN -Continue home meds  #DM2 -Continue home metformin   Dispo: Anticipated discharge in approximately 2 day(s).   Minus Liberty, MD 07/23/2016, 10:25 AM Pager: [phone]

## 2016-07-23 NOTE — Progress Notes (Signed)
Internal Medicine Attending:   I saw and examined the patient. I reviewed the resident's note and I agree with the resident's findings and plan as documented in the resident's note.  65 year old woman admitted with persistent COPD exacerbation despite outpatient treatment. I think she likely has some amount of progressive underlying COPD and potentially his hypoxic home which could explain her persistent symptoms. On exam this morning she is on 2 L nasal cannula at rest, lungs still have inspiratory and expiratory wheezing throughout. Plan to continue systemic steroids and antibiotics today. Duo nebs every 4 hours while awake. Please ambulate the patient with saturations, I encouraged out of bed activity to prevent deconditioning.

## 2016-07-23 NOTE — Progress Notes (Signed)
Pt's SAT was 99% on RA. RT turned her O2 off and explained to her if she felt sOB to let the nurse know and we could put it back on

## 2016-07-23 NOTE — Progress Notes (Signed)
Inpatient Diabetes Program Recommendations  AACE/ADA: New Consensus Statement on Inpatient Glycemic Control (2015)  Target Ranges:  Prepandial:   less than 140 mg/dL      Peak postprandial:   less than 180 mg/dL (1-2 hours)      Critically ill patients:  140 - 180 mg/dL   Lab Results  Component Value Date   GLUCAP 220 (H) 07/23/2016   HGBA1C 6.3 03/01/2016    Review of Glycemic Control  Diabetes history: DM 2 Outpatient Diabetes medications: Metformin 1,000 mg BID Current orders for Inpatient glycemic control: Metformin 1,000 mg BID  Inpatient Diabetes Program Recommendations:   Patient taking PO prednisone 40 mg Daily, Glucose 198/220 today. Patient also taking home dose of Metformin. While inpatient, please order Novolog Sensitive Correction (0-9 units)TID.  Thanks,  Tama Headings RN, MSN, [facility] Inpatient Diabetes Coordinator Team Pager [phone] (8a-5p)

## 2016-07-23 NOTE — Progress Notes (Signed)
Counseled patient on two new medications that she is starting, Prednisone and Azithromycin. Discussed indications and potential side effects (GI upset for azithromycin/prednisone; take prednisone in the morning, blood sugar reading elevation potential). Instructed to complete entire course of both medications.   Alena Bills, PharmD Candidate, [place]

## 2016-07-24 LAB — GLUCOSE, CAPILLARY
GLUCOSE-CAPILLARY: 158 mg/dL — AB (ref 65–99)
GLUCOSE-CAPILLARY: 150 mg/dL — AB (ref 65–99)

## 2016-07-24 MED ORDER — GUAIFENESIN ER 600 MG PO TB12
1200.0000 mg | ORAL_TABLET | Freq: Two times a day (BID) | ORAL | Status: DC
  Administered 2016-07-24 – 2016-07-26 (×4): 1200 mg via ORAL
  Filled 2016-07-24 (×4): qty 2

## 2016-07-24 MED ORDER — DIPHENHYDRAMINE HCL 50 MG/ML IJ SOLN
12.5000 mg | Freq: Once | INTRAMUSCULAR | Status: AC
  Administered 2016-07-24: 12.5 mg via INTRAVENOUS

## 2016-07-24 MED ORDER — LORAZEPAM 1 MG PO TABS
1.0000 mg | ORAL_TABLET | Freq: Every day | ORAL | Status: DC | PRN
  Administered 2016-07-25 – 2016-07-26 (×2): 1 mg via ORAL
  Filled 2016-07-24 (×2): qty 1

## 2016-07-24 MED ORDER — HYDROCODONE-HOMATROPINE 5-1.5 MG/5ML PO SYRP
5.0000 mL | ORAL_SOLUTION | Freq: Four times a day (QID) | ORAL | Status: DC | PRN
  Administered 2016-07-24 (×2): 5 mL via ORAL
  Filled 2016-07-24 (×2): qty 5

## 2016-07-24 MED ORDER — DIPHENHYDRAMINE HCL 50 MG/ML IJ SOLN
INTRAMUSCULAR | Status: AC
  Filled 2016-07-24: qty 1

## 2016-07-24 NOTE — Progress Notes (Signed)
   Subjective: Patient was seen and examined this morning. She states she had a rough night due to anxiety and continued wheezing. Patient continues to feel short of breath with persistent cough. She does not feel well enough to go home.   Objective: Vital signs in last 24 hours: Vitals:   07/23/16 2144 07/24/16 0352 07/24/16 0449 07/24/16 0740  BP:   (!) 126/58 131/69  Pulse:   81 90  Resp:   20 18  Temp:   98.4 F (36.9 C) 98.5 F (36.9 C)  TempSrc:   Oral Oral  SpO2: 98% 99% 99% 100%  Weight:      Height:       Physical Exam General: Vital signs reviewed.  Patient is well-developed and well-nourished, in no acute distress and cooperative with exam.  Cardiovascular: RRR, S1 normal, S2 normal, no murmurs, gallops, or rubs. Pulmonary/Chest: Diffuse expiratory wheezes and scattered rhonchi. No rales.  Abdominal: Soft, non-tender, non-distended, BS + Extremities: No lower extremity edema bilaterally Skin: Warm, dry and intact.  Psychiatric: Agitated mood and affect.   Assessment/Plan: Principal Problem:   COPD exacerbation (HCC) Active Problems:   DM2 (diabetes mellitus, type 2) ([place])   TOBACCO ABUSE   Depression with generalized anxiety   Hypertension, benign essential, goal below 140/90   Preventative health care   Dyslipidemia associated with type 2 diabetes mellitus (HCC)   COPD, frequent exacerbations ([place])  54 F with PMHx of COPD and ongoing tobacco abuse presents with failed outpatient therapy for COPD exacerbation.  COPD Exacerbation: Failed outpatient treatment. Patient is currently on Duonebs, Azithromycin, and Prednisone without relief. She does not feel well enough to go home and states she will come right back. She continues to have persistent wheezing on exam, but is satting well on room air.  -Duonebs Q4H -Prednisone 40 mg QD -Azithromycin 250 mg QD -Increase Mucinex to 1200 mg BID -Switch to Hycodan Q6H prn  -Dulera BID -Ocean Nasal Spray -Tylenol prn  headache -Ambulate with pulse ox  HTN: Normotensive. Controlled on home medications of HCTZ 25 mg QD, losartan 100 mg QD, metoprolol 25 mg BID. -Continue home medications  Tobacco Abuse: Counseled on cessation. -Bupropion 150 mg QD  Anxiety and Depression: On bupropion at home, will add as needed ativan given her panic attacks. -Bupropion 150 mg QD -Ativan 1 mg QD prn  T2DM: On Metformin at home. -Continue Metformin 1000 mg BID  DVT/PE ppx: Lovenox SQ QD FEN: CM CODE: FULL  Dispo: Anticipated discharge in approximately 2 day(s).   LOS: 2 days   Martyn Malay, DO PGY-3 Internal Medicine Resident Pager # [phone] 07/24/2016 8:59 AM

## 2016-07-25 LAB — GLUCOSE, CAPILLARY
GLUCOSE-CAPILLARY: 153 mg/dL — AB (ref 65–99)
GLUCOSE-CAPILLARY: 319 mg/dL — AB (ref 65–99)
Glucose-Capillary: 180 mg/dL — ABNORMAL HIGH (ref 65–99)

## 2016-07-25 MED ORDER — DIPHENHYDRAMINE HCL 50 MG/ML IJ SOLN
12.5000 mg | Freq: Once | INTRAMUSCULAR | Status: AC
  Administered 2016-07-25: 12.5 mg via INTRAVENOUS
  Filled 2016-07-25: qty 1

## 2016-07-25 MED ORDER — HYDROCODONE-HOMATROPINE 5-1.5 MG/5ML PO SYRP
5.0000 mL | ORAL_SOLUTION | Freq: Four times a day (QID) | ORAL | Status: DC
  Administered 2016-07-25 – 2016-07-26 (×5): 5 mL via ORAL
  Filled 2016-07-25 (×5): qty 5

## 2016-07-25 MED ORDER — POLYVINYL ALCOHOL 1.4 % OP SOLN
1.0000 [drp] | OPHTHALMIC | Status: DC | PRN
  Filled 2016-07-25: qty 15

## 2016-07-25 MED ORDER — BUPROPION HCL ER (XL) 150 MG PO TB24
300.0000 mg | ORAL_TABLET | Freq: Every day | ORAL | Status: DC
  Administered 2016-07-25 – 2016-07-26 (×2): 300 mg via ORAL
  Filled 2016-07-25 (×2): qty 2

## 2016-07-25 NOTE — Progress Notes (Signed)
O2 sats on room air after ambulation...100%. Susie Cassette RN

## 2016-07-25 NOTE — Progress Notes (Signed)
   Subjective: Patient was seen and examined this morning. She feels that she is getting a little better. She has been coughing up more mucus which has helped her breathing. The cough is better on her new cough medication. She continues to be more short of breath than her baseline. She wanted to walk around with nursing yesterday, but no one was available.   Objective: Vital signs in last 24 hours: Vitals:   07/24/16 1702 07/24/16 2044 07/25/16 0218 07/25/16 0423  BP: 130/63 114/67  136/62  Pulse: 85 94  80  Resp: 18 19  19   Temp: 98.6 F (37 C) 97.8 F (36.6 C)  98.1 F (36.7 C)  TempSrc: Oral Axillary  Oral  SpO2: 99% 100% 99% 99%  Weight:  194 lb 7.1 oz (88.2 kg)    Height:       Physical Exam General: Vital signs reviewed.  Patient is well-developed and well-nourished, in no acute distress and cooperative with exam.  Cardiovascular: RRR, S1 normal, S2 normal, no murmurs, gallops, or rubs. Pulmonary/Chest: Diffuse mild expiratory wheezes. No rales or rhonchi. Violent coughing spells. Abdominal: Soft, non-tender, non-distended, BS + Extremities: No lower extremity edema bilaterally  Assessment/Plan: Principal Problem:   COPD exacerbation (HCC) Active Problems:   DM2 (diabetes mellitus, type 2) (HCC)   TOBACCO ABUSE   Depression with generalized anxiety   Hypertension, benign essential, goal below 140/90   Preventative health care   Dyslipidemia associated with type 2 diabetes mellitus (HCC)   COPD, frequent exacerbations ([place])  11 F with PMHx of COPD and ongoing tobacco abuse presents with failed outpatient therapy for COPD exacerbation.  COPD Exacerbation: Mild improvement with Duonebs Q6H, Azithromycin, and Prednisone 40 mg QD. She continues to have persistent wheezing on exam, but improved from yesterday.  -Duonebs Q6H -Prednisone 40 mg QD -Azithromycin 250 mg QD -Mucinex to 1200 mg BID -Hycodan Q6H  -Dulera BID -Ocean Nasal Spray -Tylenol prn  headache -Ambulate with pulse ox  HTN: Normotensive. Controlled on home medications of HCTZ 25 mg QD, losartan 100 mg QD, metoprolol 25 mg BID. -Continue home medications  Tobacco Abuse: Counseled on cessation. -Increase Bupropion XL to 300 mg QD  Anxiety and Depression: On bupropion at home, will increase dose given her persistent anxiety and tobacco use. -Increase Bupropion XL to 300 mg QD -Ativan 1 mg QD prn  T2DM: On Metformin at home. -Continue Metformin 1000 mg BID  DVT/PE ppx: Lovenox SQ QD FEN: CM CODE: FULL  Dispo: Anticipated discharge in approximately 1 day(s).   LOS: 3 days   Martyn Malay, DO PGY-3 Internal Medicine Resident Pager # [phone] 07/25/2016 9:42 AM

## 2016-07-26 ENCOUNTER — Telehealth: Payer: Self-pay | Admitting: Internal Medicine

## 2016-07-26 ENCOUNTER — Other Ambulatory Visit: Payer: Self-pay | Admitting: Licensed Clinical Social Worker

## 2016-07-26 DIAGNOSIS — J441 Chronic obstructive pulmonary disease with (acute) exacerbation: Secondary | ICD-10-CM

## 2016-07-26 LAB — GLUCOSE, CAPILLARY
GLUCOSE-CAPILLARY: 123 mg/dL — AB (ref 65–99)
Glucose-Capillary: 122 mg/dL — ABNORMAL HIGH (ref 65–99)

## 2016-07-26 MED ORDER — GUAIFENESIN ER 600 MG PO TB12: 1200.0000 mg | ORAL_TABLET | Freq: Two times a day (BID) | ORAL | 0 refills | Status: AC

## 2016-07-26 MED ORDER — HYDROCODONE-HOMATROPINE 5-1.5 MG/5ML PO SYRP: 5.0000 mL | ORAL_SOLUTION | Freq: Four times a day (QID) | ORAL | 0 refills | Status: AC | PRN

## 2016-07-26 MED ORDER — BUPROPION HCL ER (XL) 300 MG PO TB24: 300.0000 mg | ORAL_TABLET | Freq: Every day | ORAL | 2 refills | Status: DC

## 2016-07-26 MED ORDER — CITALOPRAM HYDROBROMIDE 10 MG PO TABS: 10.0000 mg | ORAL_TABLET | Freq: Every day | ORAL | 0 refills | Status: DC

## 2016-07-26 NOTE — Progress Notes (Addendum)
Inpatient Diabetes Program Recommendations  AACE/ADA: New Consensus Statement on Inpatient Glycemic Control (2015)  Target Ranges:  Prepandial:   less than 140 mg/dL      Peak postprandial:   less than 180 mg/dL (1-2 hours)      Critically ill patients:  140 - 180 mg/dL   Results for [name], [name] (MRN [id number]) as of 07/26/2016 09:56  Ref. Range 07/25/2016 08:30 07/25/2016 16:42 07/25/2016 22:07  Glucose-Capillary Latest Ref Range: 65 - 99 mg/dL 153 (H) 319 (H) 180 (H)    Home DM Meds: Metformin 1000 mg bid  Current Orders: Metformin 1000 mg bid     MD- Patient currently receiving Prednisone 40 mg daily.  CBG up to 319 mg/dl at 4pm yesterday.  Please consider starting Novolog Sensitive Correction Scale/ SSI (0-9 units) TID AC + HS while here in hospital.     --Will follow patient during hospitalization--  Wyn Quaker RN, MSN, CDE Diabetes Coordinator Inpatient Glycemic Control Team Team Pager: [phone] (8a-5p)

## 2016-07-26 NOTE — Progress Notes (Addendum)
   Subjective: [name] continues to be bothered by intense coughing spells.  When she is not coughing, notes a "rattling" in her chest, but no dyspnea.  Has been able to walk the halls without difficulty breathing. She expresses trepidation about discharge, saying that she will likely come right back to the hospital.  We discussed that she does not have pneumonia and that her O2 saturation is doing well.  The cough alone is not dangerous or reason for prolonging her hospitalization, and is likely to continue indefinitely and even worsen because of her COPD and continued smoking.   Objective:  Vital signs in last 24 hours: Vitals:   07/25/16 1950 07/25/16 2207 07/26/16 0225 07/26/16 0511  BP:  130/63  (!) 144/72  Pulse:  88  84  Resp:  15  18  Temp:  97.9 F (36.6 C)  98.3 F (36.8 C)  TempSrc:  Oral  Oral  SpO2: 99% 100% 99% 100%  Weight:      Height:       Physical Exam  Constitutional: She is oriented to person, place, and time. She appears well-developed and well-nourished.  In no distress, taking nebulizer treatment  Cardiovascular: Normal rate and regular rhythm.   Pulmonary/Chest:  No respiratory distress, mild diffuse wheezes with mildly prolonged respiratory phase.  Paroxysmal coughing.  Neurological: She is alert and oriented to person, place, and time.  Skin: Skin is warm and dry.  Psychiatric: She has a normal mood and affect. Her behavior is normal.   07/25/16 O2 sat 100% on RA with ambulation  Assessment/Plan:  Principal Problem:   COPD exacerbation (HCC) Active Problems:   DM2 (diabetes mellitus, type 2) (HCC)   TOBACCO ABUSE   Depression with generalized anxiety   Hypertension, benign essential, goal below 140/90   Preventative health care   Dyslipidemia associated with type 2 diabetes mellitus (HCC)   COPD, frequent exacerbations (HCC)  #COPD Exacerbation vs Progressive COPD With multiple recent courses of steroid and antibiotics at outpatient for COPD  exacerbation, this may represent progression of her COPD.  Will treat for COPD exacerbation again with steroids and antibiotics, and consider revising her controller meds.  Options include increasing Advair dose and adding chronic macrolide.  Last PFTs (06/23/15) GOLD 2.  One other concern for her chronic cough is malignancy in the setting of her extensive smoking history. -Duonebs Q4H -Prednisone 40 mg daily to complete 5 days of steroids (last 9/18) -D/c ceftriaxone with no evidence of pneumonia -Azithromycin for 5 days (last 9/18) -Dulera, Spiriva -Symptomatic treatment with hycodan, mucinex -Consider outpatient pulmonology referral -Consider outpatient screening chest CT for malignancy  #Anxiety #Tobacco Abuse Increased home bupropion to 300 mg daily -Continue bupropion 300 mg daily -Ativan 1 mg daily PRN -Add SSRI for anxiety on discharge  #HTN Well controlled. -Continue home meds  #DM2 CBGs mostly in 100s. -Continue home metformin   Dispo: Anticipated discharge in approximately 1 day(s).   Minus Liberty, MD 07/26/2016, 6:38 AM Pager: [phone]

## 2016-07-26 NOTE — Care Management Note (Signed)
Case Management Note  Patient Details  Name: [name] MRN: [id number] Date of Birth: [birth year]  Subjective/Objective:    CM following for progression and d/c planning.                 Action/Plan: 07/26/2016 Pt for d/c to home [facility] will provide services and followup at home.   Expected Discharge Date:     07/26/2016             Expected Discharge Plan:  Home/Self Care  In-House Referral:  NA  Discharge planning Services  NA  Post Acute Care Choice:  NA Choice offered to:  NA  DME Arranged:   NA DME Agency:   NA  HH Arranged:   NA HH Agency:   NA  Status of Service:  Completed, signed off  If discussed at [place] of Stay Meetings, dates discussed:    Additional Comments:  Adron Bene, RN 07/26/2016, 3:46 PM

## 2016-07-26 NOTE — Telephone Encounter (Signed)
Needs TOC Discharge Date 07/26/16 HFU 07/29/16

## 2016-07-26 NOTE — Care Management Important Message (Signed)
Important Message  Patient Details  Name: [name] MRN: [id number] Date of Birth: 1951-11-02   Medicare Important Message Given:  Yes    Suhey Radford Montine Circle 07/26/2016, 11:33 AM

## 2016-07-26 NOTE — Discharge Summary (Signed)
Medicine attending discharge note: I personally examined this patient on the day of discharge and I attest to the accuracy of the discharge evaluation and plan as recorded in the daily progress note by resident physician Dr. Minus Liberty.  Clinical summary:  64 year old woman who continues to smoke presented who has  chronic obstructive airway disease with a bronchospastic component. She presented to the outpatient clinic on the day of admission September 14 with increasing dyspnea and a dry cough. She had recently received 2 sequential courses of antibiotics and steroids in addition to her bronchodilators but symptoms progressed and she was admitted for further evaluation. Baseline pulmonary functions recorded 06/23/2015 with FEV1 68% and FVC 94% of predicted, ratio 57%. Uncorrected DLCO 69% of predicted. On initial exam by Dr. Inda Castle she did not appear to be in acute distress. Pertinent findings limited to the lungs which had diffuse wheezing bilaterally. Blood pressure 126/61, pulse 89 and regular, temperature 99.1, respiratory rate 18, oxygen saturation 100% on room air. No erythema or exudate in the oropharynx. Chest radiograph showed mild hyperinflation of the lungs with no infiltrate or effusion. Surgical changes from previous cervical spine fusion. Cardiogram without any acute ischemic change or arrhythmia. White count normal at 6800.  Hospital course: She was started on another course of oral azithromycin following a single dose of parenteral Rocephin. An additional course of prednisone by mouth. Bronchodilators were continued. Low volume oxygen 2 L administered. She had a persistent dry cough. Mucinex was added. She was bringing up small volumes of clear sputum on the day of discharge but had the sensation that secretions were stuck in her chest that she could not expectorate. On lung exam she did have scattered bilateral wheezes but was moving air well. She was extremely anxious that  she was not going to improve. She admitted she was prone to panic attacks. We we emphasized the importance of smoking cessation and she is committed to stopping. Bupropion dose was increased. Oxygenation remained stable although she variably used oxygen up to 3 L/m. On the day of discharge she was saturating at 100% on room air with ambulation. She felt very uncomfortable leaving the hospital. We tried to reassure her that we would arrange for close follow-up following discharge and reinstitute [facility] services for home monitoring. We told her we did not think that we could add anything else in the hospital that could not be given at home. This is likely a viral illness and a prolonged cough is not unexpected. I discussed with the housestaff that if her cough persists, in view of her ongoing tobacco abuse, a screening CT scan of the chest could be done. If this was negative and she continued to have an unexplained cough, then referral to pulmonary medicine for endoscopy to rule out endobronchial pathology would be a consideration.  Disposition:  Condition stable at time of discharge. She has a persistent cough and wheezing. She will complete a course of oral azithromycin and prednisone at home. Short interim follow-up in our medicine clinic. Care management has reestablished her with [facility] for home monitoring. This woman would benefit greatly from psychologic counseling for her anxiety. She is already on an antidepressant which also serves as a smoking suppressant. We added as needed lorazepam. There were no complications.

## 2016-07-26 NOTE — Consult Note (Signed)
   [facility] CM Inpatient Consult   07/26/2016  [name] 1951-02-21 [id number]   Referral received from Golden Hurter, [facility].  Patient was assessed for [facility] for community services. Patient was previously active with [facility].  Met with patient at bedside regarding being restarted with [facility] services.  Patient has an active consent form signed a brochure with [facility] information given, along with the 24 hour nurse advise line magnet.  Patient states she would like follow up for support for COPD management.  States she feel since she is not feeling well at discharge she would feel better with a nurse following up because of anxiety and panic attacks.   Of note, [facility] Care Management services does not replace or interfere with any services that are arranged by inpatient case management or social work. For additional questions or referrals please contact:  Natividad Brood, RN BSN [facility] Liaison  [phone] business mobile phone Toll free office [phone]

## 2016-07-26 NOTE — Progress Notes (Signed)
Pt being discharged home via wheelchair with family. Pt alert and oriented x4. VSS. Pt c/o no pain at this time. No signs of respiratory distress. Education complete and care plans resolved. IV removed with catheter intact and pt tolerated well. No further issues at this time. Pt to follow up with PCP. Addysyn Fern R, RN 

## 2016-07-26 NOTE — Progress Notes (Signed)
Internal Medicine Clinic Attending  I saw and evaluated the patient.  I personally confirmed the key portions of the history and exam documented by Dr. Johnson and I reviewed pertinent patient test results.  The assessment, diagnosis, and plan were formulated together and I agree with the documentation in the resident's note.  

## 2016-07-26 NOTE — Discharge Instructions (Signed)
You were admitted to hospital for worsening of your COPD.  We treated you with steroids, antibiotics, and breathing treatments and your breathing improved, with your oxygen levels staying at 100% even while walking.  We did not find any evidence of pneumonia or any other dangerous causes for your shortness of breath.  Your cough is likely related to your COPD and smoking.  If you continue smoking, both your cough and shortness of breath will get worse.  While in the hospital, you were given medicines to help you cough up the mucous in your lungs and also to suppress your cough.  I have given you prescriptions to continue these medicines.  You have an appointment to follow-up in clinic this Thursday to check on your breathing.  Please come back to the ED if you have worse wheezing, shortness of breath, or cough up blood, or if you have chest pain.  Chronic Obstructive Pulmonary Disease Chronic obstructive pulmonary disease (COPD) is a common lung condition in which airflow from the lungs is limited. COPD is a general term that can be used to describe many different lung problems that limit airflow, including both chronic bronchitis and emphysema. If you have COPD, your lung function will probably never return to normal, but there are measures you can take to improve lung function and make yourself feel better. CAUSES   Smoking (common).  Exposure to secondhand smoke.  Genetic problems.  Chronic inflammatory lung diseases or recurrent infections. SYMPTOMS  Shortness of breath, especially with physical activity.  Deep, persistent (chronic) cough with a large amount of thick mucus.  Wheezing.  Rapid breaths (tachypnea).  Gray or bluish discoloration (cyanosis) of the skin, especially in your fingers, toes, or lips.  Fatigue.  Weight loss.  Frequent infections or episodes when breathing symptoms become much worse (exacerbations).  Chest tightness. DIAGNOSIS Your health care provider  will take a medical history and perform a physical examination to diagnose COPD. Additional tests for COPD may include:  Lung (pulmonary) function tests.  Chest X-ray.  CT scan.  Blood tests. TREATMENT  Treatment for COPD may include:  Inhaler and nebulizer medicines. These help manage the symptoms of COPD and make your breathing more comfortable.  Supplemental oxygen. Supplemental oxygen is only helpful if you have a low oxygen level in your blood.  Exercise and physical activity. These are beneficial for nearly all people with COPD.  Lung surgery or transplant.  Nutrition therapy to gain weight, if you are underweight.  Pulmonary rehabilitation. This may involve working with a team of health care providers and specialists, such as respiratory, occupational, and physical therapists. HOME CARE INSTRUCTIONS  Take all medicines (inhaled or pills) as directed by your health care provider.  Avoid over-the-counter medicines or cough syrups that dry up your airway (such as antihistamines) and slow down the elimination of secretions unless instructed otherwise by your health care provider.  If you are a smoker, the most important thing that you can do is stop smoking. Continuing to smoke will cause further lung damage and breathing trouble. Ask your health care provider for help with quitting smoking. He or she can direct you to community resources or hospitals that provide support.  Avoid exposure to irritants such as smoke, chemicals, and fumes that aggravate your breathing.  Use oxygen therapy and pulmonary rehabilitation if directed by your health care provider. If you require home oxygen therapy, ask your health care provider whether you should purchase a pulse oximeter to measure your oxygen level  at home.  Avoid contact with individuals who have a contagious illness.  Avoid extreme temperature and humidity changes.  Eat healthy foods. Eating smaller, more frequent meals and  resting before meals may help you maintain your strength.  Stay active, but balance activity with periods of rest. Exercise and physical activity will help you maintain your ability to do things you want to do.  Preventing infection and hospitalization is very important when you have COPD. Make sure to receive all the vaccines your health care provider recommends, especially the pneumococcal and influenza vaccines. Ask your health care provider whether you need a pneumonia vaccine.  Learn and use relaxation techniques to manage stress.  Learn and use controlled breathing techniques as directed by your health care provider. Controlled breathing techniques include:  Pursed lip breathing. Start by breathing in (inhaling) through your nose for 1 second. Then, purse your lips as if you were going to whistle and breathe out (exhale) through the pursed lips for 2 seconds.  Diaphragmatic breathing. Start by putting one hand on your abdomen just above your waist. Inhale slowly through your nose. The hand on your abdomen should move out. Then purse your lips and exhale slowly. You should be able to feel the hand on your abdomen moving in as you exhale.  Learn and use controlled coughing to clear mucus from your lungs. Controlled coughing is a series of short, progressive coughs. The steps of controlled coughing are: 1. Lean your head slightly forward. 2. Breathe in deeply using diaphragmatic breathing. 3. Try to hold your breath for 3 seconds. 4. Keep your mouth slightly open while coughing twice. 5. Spit any mucus out into a tissue. 6. Rest and repeat the steps once or twice as needed. SEEK MEDICAL CARE IF:  You are coughing up more mucus than usual.  There is a change in the color or thickness of your mucus.  Your breathing is more labored than usual.  Your breathing is faster than usual. SEEK IMMEDIATE MEDICAL CARE IF:  You have shortness of breath while you are resting.  You have  shortness of breath that prevents you from:  Being able to talk.  Performing your usual physical activities.  You have chest pain lasting longer than 5 minutes.  Your skin color is more cyanotic than usual.  You measure low oxygen saturations for longer than 5 minutes with a pulse oximeter. MAKE SURE YOU:  Understand these instructions.  Will watch your condition.  Will get help right away if you are not doing well or get worse.   This information is not intended to replace advice given to you by your health care provider. Make sure you discuss any questions you have with your health care provider.   Document Released: 08/04/2005 Document Revised: 11/15/2014 Document Reviewed: 06/21/2013 Elsevier Interactive Patient Education [employer].

## 2016-07-27 ENCOUNTER — Other Ambulatory Visit: Payer: Self-pay | Admitting: *Deleted

## 2016-07-27 NOTE — Patient Outreach (Signed)
[facility]) Care Management  07/27/2016  DUA DISHON 1951/02/10 [id number]   Referral received from hospital liaison to initiate transition of care program.  Member admitted on 9/14 for COPD exacerbation, discharged 9/18.  Per chart, history of diabetes, hypertension, anxiety and depression.  Call placed to member at listed preferred number, [phone], no answer.  HIPAA compliant voice message left.  Member was active with [facility] in the past, but was very difficult to maintain contact and for that reason, case was closed.  Will await call back, if no call back will make 2nd attempt to contact tomorrow.  [name], [place], MSN [place] [phone]

## 2016-07-27 NOTE — Telephone Encounter (Signed)
Pt called to see how she has been doing since discharged from the hospital - no answer; left message to return my call.

## 2016-07-27 NOTE — Telephone Encounter (Signed)
Pt is returning call to nurse.  °

## 2016-07-27 NOTE — Telephone Encounter (Signed)
Transition Care Management Follow-up Telephone Call   Date discharged? 07/26/16   How have you been since you were released from the hospital? Not feeling well; p cough- States she should had stayed longer than 3 days;just starting to loosing up what's in her chest.   Do you understand why you were in the hospital? yes   Do you understand the discharge instructions? yes   Where were you discharged to? home   Items Reviewed:  Medications reviewed: yes  Allergies reviewed: no  Dietary changes reviewed: n/a  Referrals reviewed: n/a   Functional Questionnaire:   Activities of Daily Living (ADLs):   She does everything at home for herself.   Any transportation issues/concerns?: no   Any patient concerns? cough   Confirmed importance and date/time of follow-up visits scheduled   Provider Appointment booked with [facility] @1415  PM  Confirmed with patient if condition begins to worsen call PCP or go to the ER.  Patient was given the office number and encouraged to call back with question or concerns.  : Yes pt aware to call for any change/worsen of condition.

## 2016-07-28 ENCOUNTER — Other Ambulatory Visit: Payer: Self-pay | Admitting: *Deleted

## 2016-07-28 ENCOUNTER — Telehealth: Payer: Self-pay | Admitting: Internal Medicine

## 2016-07-28 NOTE — Patient Outreach (Signed)
[facility]) Care Management  07/28/2016  [name] [birth year] [id number]   2nd attempt made to contact member to initiate transition of care program, no answer.  HIPAA compliant voice message left.  Will make 3rd attempt tomorrow.  [name], [place], MSN [place] [phone]

## 2016-07-28 NOTE — Telephone Encounter (Signed)
APT. REMINDER CALL, LMTCB °

## 2016-07-29 ENCOUNTER — Other Ambulatory Visit: Payer: Self-pay | Admitting: *Deleted

## 2016-07-29 ENCOUNTER — Ambulatory Visit (INDEPENDENT_AMBULATORY_CARE_PROVIDER_SITE_OTHER): Payer: Medicare Other | Admitting: Internal Medicine

## 2016-07-29 ENCOUNTER — Other Ambulatory Visit: Payer: Self-pay | Admitting: Internal Medicine

## 2016-07-29 ENCOUNTER — Encounter: Payer: Self-pay | Admitting: *Deleted

## 2016-07-29 VITALS — BP 128/73 | HR 88 | Temp 98.4°F | Wt 199.1 lb

## 2016-07-29 DIAGNOSIS — J441 Chronic obstructive pulmonary disease with (acute) exacerbation: Secondary | ICD-10-CM | POA: Diagnosis not present

## 2016-07-29 DIAGNOSIS — R062 Wheezing: Secondary | ICD-10-CM | POA: Insufficient documentation

## 2016-07-29 DIAGNOSIS — F17210 Nicotine dependence, cigarettes, uncomplicated: Secondary | ICD-10-CM

## 2016-07-29 MED ORDER — ALBUTEROL SULFATE (2.5 MG/3ML) 0.083% IN NEBU
2.5000 mg | INHALATION_SOLUTION | Freq: Once | RESPIRATORY_TRACT | Status: AC
  Administered 2016-07-29: 2.5 mg via RESPIRATORY_TRACT

## 2016-07-29 MED ORDER — PREDNISONE 20 MG PO TABS: ORAL_TABLET | ORAL | 0 refills | Status: DC

## 2016-07-29 MED ORDER — IPRATROPIUM BROMIDE 0.02 % IN SOLN
0.5000 mg | Freq: Once | RESPIRATORY_TRACT | Status: AC
  Administered 2016-07-29: 0.5 mg via RESPIRATORY_TRACT

## 2016-07-29 NOTE — Assessment & Plan Note (Signed)
>>  ASSESSMENT AND PLAN FOR CHRONIC OBSTRUCTIVE PULMONARY DISEASE WITH BRONCHOSPASM ([address]) WRITTEN ON 07/29/2016 11:27 PM BY Wynetta Emery, ADAM, MD  Discharged 3 days ago from 3-4 day hospitalization for COPD exacerbation, was ambulated with 100% SpO2 on RA on day of discharge. Some improvement from last visit to this (same) provider - she is speaking in full sentences and has no tachypnea. Her wheezing and rattling productive cough are the same. The sensation of phlegm deep in her lungs that cannot clear with coughing is very distressing to her. She continues to endorse significant fatigue/malaise, subjective fevers/chills, congestion, wheezing, and shortness of breath - despite using Duonebs at home Q4-6 hour and continuing her prescribed medications. The persistent coughing has made her chest sore and gives her a headache. She continues to feel anxious when she gets short of breath but denies feeling more anxious than usual. Her Bupropion dose was increased during her recent hospitalization to help with smoking cessation - will need to monitor for worsening anxiety. She continues to smoke but has cut down to 1/4 ppd (vs 3/4 ppd last week), encouraged continued work toward full cessation. On exam she is diffusely wheezing and uncomfortable, states nearly 10 hours since last Duoneb.   Plan: - Referral to pulmonary rehab, although her COPD is not moderate/severe (FEV1 68%, FEV1/FVC 57% expected from PFTs 2016) she has frequent exacerbations and expresses profound discomfort and difficulty clearing phlegm from deep in her chest - would benefit greatly from chest physiotherapy and other breathing exercises - Provided 1x Duoneb treatment in clinic - Asked about tapering Wellbutrin due to concern over anxiety side effect profile, patient prefers to try the medication longer to see if new increased dose helps her stop smoking, encouraged full cessation - Advised to use claritin for allergies and saline spray for  congestion, patient refuses Flonase stating that it makes her feel horrible - Patient requested short 1x Rx for prednisone burst to use as needed - unorthodox but see no harm in granting this request once - provided printed Rx for 4 days 40 Prednisone, advised to call or return to clinic instead of using this if symptoms are serious

## 2016-07-29 NOTE — Patient Outreach (Signed)
[facility]) Care Management  07/29/2016  KENLI WINNING 30-Apr-1951 [id number]   3rd attempt made to contact member, no answer.  HIPAA compliant voice message left.  Will await call back.  Will send outreach letter, if no response in 10 days will close case.  [name], [place], MSN [place] [phone]

## 2016-07-29 NOTE — Patient Instructions (Addendum)
Please continue to take her medications and inhalers as prescribed.   We have placed a referral for pulmonary rehabilitation - approval pending. If for whatever reason you do not qualify we can set up home health PT to help with breathing.  Your prescription for Bupropion is 300mg  daily to help with smoking cessation.  We have provided a short prescription for Prednisone.  Complete smoking cessation will significantly improve your breathing.

## 2016-07-29 NOTE — Assessment & Plan Note (Signed)
Discharged 3 days ago from 3-4 day hospitalization for COPD exacerbation, was ambulated with 100% SpO2 on RA on day of discharge. Some improvement from last visit to this (same) provider - she is speaking in full sentences and has no tachypnea. Her wheezing and rattling productive cough are the same. The sensation of phlegm deep in her lungs that cannot clear with coughing is very distressing to her. She continues to endorse significant fatigue/malaise, subjective fevers/chills, congestion, wheezing, and shortness of breath - despite using Duonebs at home Q4-6 hour and continuing her prescribed medications. The persistent coughing has made her chest sore and gives her a headache. She continues to feel anxious when she gets short of breath but denies feeling more anxious than usual. Her Bupropion dose was increased during her recent hospitalization to help with smoking cessation - will need to monitor for worsening anxiety. She continues to smoke but has cut down to 1/4 ppd (vs 3/4 ppd last week), encouraged continued work toward full cessation. On exam she is diffusely wheezing and uncomfortable, states nearly 10 hours since last Duoneb.   Plan: - Referral to pulmonary rehab, although her COPD is not moderate/severe (FEV1 68%, FEV1/FVC 57% expected from PFTs 2016) she has frequent exacerbations and expresses profound discomfort and difficulty clearing phlegm from deep in her chest - would benefit greatly from chest physiotherapy and other breathing exercises - Provided 1x Duoneb treatment in clinic - Asked about tapering Wellbutrin due to concern over anxiety side effect profile, patient prefers to try the medication longer to see if new increased dose helps her stop smoking, encouraged full cessation - Advised to use claritin for allergies and saline spray for congestion, patient refuses Flonase stating that it makes her feel horrible - Patient requested short 1x Rx for prednisone burst to use as needed -  unorthodox but see no harm in granting this request once - provided printed Rx for 4 days 40 Prednisone, advised to call or return to clinic instead of using this if symptoms are serious

## 2016-07-29 NOTE — Progress Notes (Signed)
   CC: Hospital follow-up, COPD exacerbation   HPI:  [name] is a 65 y.o. female with PMHx detailed below presenting for follow-up of her recent hospitalization for COPD exacerbation. She states that she feels no better than she did last week with ongoing productive cough, congestion, ratting in her chest, and dyspnea. She is demanding Duoneb treatment and expresses frustration, stating that "the doctors do the same thing every time, even when it does not work."  See problem based assessment and plan below for additional details.  Past Medical History:  Diagnosis Date  . Anxiety   . Asthma   . Bronchitis    h/o  . Cancer of right breast ([place]) 1991   s/p lumpectomy, chemotherapy and radiation therapy in 1991. Mammogram in 2007 was normal.  . Constipated    h/o  . COPD (chronic obstructive pulmonary disease) ([place])    History of multiple hospital admissions for exercabation   . COPD with exacerbation ([place]) 04/06/2009   Qualifier: Diagnosis of  By: Eyvonne Mechanic MD, Vijay    . Depression   . Diarrhea    h/o  . GERD (gastroesophageal reflux disease)   . Headache    "a few times/month" (07/22/2016)  . Heart murmur 10/05/11   "first time I ever heard I had one was today"  . Hyperlipidemia   . Hypertension   . Obesity   . Pneumonia   . Shortness of breath 10/05/11   "at rest; lying down; w/exertion"  . Sigmoid diverticulitis 80/2008  . Tobacco abuse   . Type II diabetes mellitus ([place])    Review of Systems: Review of Systems  Constitutional: Positive for chills, fever and malaise/fatigue.  HENT: Positive for congestion.   Respiratory: Positive for cough, sputum production, shortness of breath and wheezing.   Cardiovascular: Positive for chest pain. Negative for leg swelling.  Gastrointestinal: Positive for abdominal pain. Negative for constipation and diarrhea.  Musculoskeletal: Negative for back pain and falls.    Physical Exam: Vitals:   07/29/16 1454  BP: 128/73    Pulse: 88  Temp: 98.4 F (36.9 C)  TempSrc: Oral  SpO2: 100%  Weight: 199 lb 1.6 oz (90.3 kg)   Body mass index is 31.18 kg/m. GENERAL- Tired-appearing woman sitting comfortably in exam room chair, alert, in no distress, speaking in full sentences HEENT- Atraumatic, moist mucous membranes, sinuses nontender to palpation, no palpable lymphadenopathy. CARDIAC- Regular rate and rhythm, no murmurs, rubs or gallops. RESP- Diffuse bilateral inspiratory and expiratory wheezing with rhonchi, normal work of breathing ABDOMEN- Normoactive bowel sounds, soft, nontender, nondistended EXTREMITIES- Normal bulk and range of motion, no edema, 2+ peripheral pulses SKIN- Warm, dry, intact, without visible rash PSYCH- Guarded and irritable affect, clear speech, thoughts linear and goal-directed  Assessment & Plan:   See encounters tab for problem based medical decision making.  Patient seen with Dr. Dareen Piano

## 2016-08-04 NOTE — Progress Notes (Signed)
Internal Medicine Clinic Attending  I saw and evaluated the patient.  I personally confirmed the key portions of the history and exam documented by Dr. Johnson and I reviewed pertinent patient test results.  The assessment, diagnosis, and plan were formulated together and I agree with the documentation in the resident's note.  

## 2016-08-11 ENCOUNTER — Encounter: Payer: Self-pay | Admitting: Internal Medicine

## 2016-08-11 ENCOUNTER — Encounter: Payer: Self-pay | Admitting: *Deleted

## 2016-08-11 ENCOUNTER — Other Ambulatory Visit: Payer: Self-pay | Admitting: *Deleted

## 2016-08-11 NOTE — Patient Outreach (Signed)
[facility]) Care Management  08/11/2016  LAYIA GAMA [birth year] [id number]   Outreach letter sent, no response.  Will close case.  Will notify member, PCP, and care management assistant of case closure.  [name], [place], MSN [place] [phone]

## 2016-08-30 ENCOUNTER — Other Ambulatory Visit: Payer: Self-pay | Admitting: Internal Medicine

## 2016-09-03 ENCOUNTER — Encounter: Payer: Self-pay | Admitting: Internal Medicine

## 2016-09-03 ENCOUNTER — Ambulatory Visit (INDEPENDENT_AMBULATORY_CARE_PROVIDER_SITE_OTHER): Payer: Medicare Other | Admitting: Internal Medicine

## 2016-09-03 VITALS — BP 128/75 | HR 100 | Temp 98.2°F | Ht 67.0 in | Wt 202.2 lb

## 2016-09-03 DIAGNOSIS — E118 Type 2 diabetes mellitus with unspecified complications: Secondary | ICD-10-CM

## 2016-09-03 DIAGNOSIS — E119 Type 2 diabetes mellitus without complications: Secondary | ICD-10-CM | POA: Diagnosis not present

## 2016-09-03 DIAGNOSIS — I10 Essential (primary) hypertension: Secondary | ICD-10-CM

## 2016-09-03 DIAGNOSIS — J441 Chronic obstructive pulmonary disease with (acute) exacerbation: Secondary | ICD-10-CM

## 2016-09-03 DIAGNOSIS — Z7984 Long term (current) use of oral hypoglycemic drugs: Secondary | ICD-10-CM | POA: Diagnosis not present

## 2016-09-03 DIAGNOSIS — F17210 Nicotine dependence, cigarettes, uncomplicated: Secondary | ICD-10-CM

## 2016-09-03 DIAGNOSIS — F418 Other specified anxiety disorders: Secondary | ICD-10-CM

## 2016-09-03 DIAGNOSIS — F17200 Nicotine dependence, unspecified, uncomplicated: Secondary | ICD-10-CM

## 2016-09-03 DIAGNOSIS — Z7951 Long term (current) use of inhaled steroids: Secondary | ICD-10-CM

## 2016-09-03 LAB — POCT GLYCOSYLATED HEMOGLOBIN (HGB A1C): HEMOGLOBIN A1C: 6.2

## 2016-09-03 LAB — GLUCOSE, CAPILLARY: Glucose-Capillary: 120 mg/dL — ABNORMAL HIGH (ref 65–99)

## 2016-09-03 MED ORDER — BUPROPION HCL ER (XL) 300 MG PO TB24: 300.0000 mg | ORAL_TABLET | Freq: Every day | ORAL | 2 refills | Status: DC

## 2016-09-03 MED ORDER — AZITHROMYCIN 250 MG PO TABS: ORAL_TABLET | ORAL | 0 refills | Status: DC

## 2016-09-03 MED ORDER — METOPROLOL TARTRATE 50 MG PO TABS: 25.0000 mg | ORAL_TABLET | Freq: Two times a day (BID) | ORAL | 9 refills | Status: DC

## 2016-09-03 MED ORDER — CITALOPRAM HYDROBROMIDE 10 MG PO TABS: 10.0000 mg | ORAL_TABLET | Freq: Every day | ORAL | 2 refills | Status: DC

## 2016-09-03 MED ORDER — GUAIFENESIN-CODEINE 100-10 MG/5ML PO SOLN: 5.0000 mL | Freq: Three times a day (TID) | ORAL | 0 refills | Status: DC | PRN

## 2016-09-03 MED ORDER — NICOTINE 7 MG/24HR TD PT24: 7.0000 mg | MEDICATED_PATCH | TRANSDERMAL | 0 refills | Status: DC

## 2016-09-03 MED ORDER — PREDNISONE 20 MG PO TABS: ORAL_TABLET | ORAL | 0 refills | Status: DC

## 2016-09-03 MED ORDER — SALINE SPRAY 0.65 % NA SOLN: 1.0000 | NASAL | 1 refills | Status: DC | PRN

## 2016-09-03 NOTE — Progress Notes (Signed)
   CC: Cold, productive cough  HPI:  [name] is a 65 y.o. female with PMHx detailed below presenting with worsening cold symptoms and cough productive of green sputum.  See problem based assessment and plan below for additional details.  Past Medical History:  Diagnosis Date  . Anxiety   . Asthma   . Bronchitis    h/o  . Cancer of right breast ([place]) 1991   s/p lumpectomy, chemotherapy and radiation therapy in 1991. Mammogram in 2007 was normal.  . Constipated    h/o  . COPD (chronic obstructive pulmonary disease) ([place])    History of multiple hospital admissions for exercabation   . COPD with exacerbation ([place]) 04/06/2009   Qualifier: Diagnosis of  By: Eyvonne Mechanic MD, Vijay    . Depression   . Diarrhea    h/o  . GERD (gastroesophageal reflux disease)   . Headache    "a few times/month" (07/22/2016)  . Heart murmur 10/05/11   "first time I ever heard I had one was today"  . Hyperlipidemia   . Hypertension   . Obesity   . Pneumonia   . Shortness of breath 10/05/11   "at rest; lying down; w/exertion"  . Sigmoid diverticulitis 80/2008  . Tobacco abuse   . Type II diabetes mellitus ([place])     Review of Systems: Review of Systems  Constitutional: Positive for malaise/fatigue. Negative for chills and fever.  Respiratory: Positive for cough and sputum production. Negative for hemoptysis, shortness of breath and wheezing.   Cardiovascular: Negative for chest pain and palpitations.  Gastrointestinal: Negative for abdominal pain, constipation, diarrhea, nausea and vomiting.  Neurological: Negative for headaches.  All other systems reviewed and are negative.    Physical Exam: Vitals:   09/03/16 1443  BP: 128/75  Pulse: 100  Temp: 98.2 F (36.8 C)  TempSrc: Oral  SpO2: 100%  Weight: 202 lb 3.2 oz (91.7 kg)  Height: 5\' 7"  (1.702 m)   Body mass index is 31.67 kg/m. GENERAL- Woman sitting comfortably in exam room chair, alert, in no distress, intermittent dry  cough HEENT- Atraumatic, PERRL, EOMI, moist mucous membranes CARDIAC- Regular rate and rhythm, no murmurs, rubs or gallops. RESP- Clear to ascultation bilaterally, no wheezing or crackles, normal work of breathing ABDOMEN- Normoactive bowel sounds, soft, nontender, nondistended EXTREMITIES- Normal bulk and range of motion, no edema, 2+ peripheral pulses SKIN- Warm, dry, intact, without visible rash PSYCH- Appropriate affect, clear speech, thoughts linear and goal-directed  Assessment & Plan:   See encounters tab for problem based medical decision making.  Patient seen with Dr. Evette Doffing

## 2016-09-03 NOTE — Patient Instructions (Addendum)
Please continue to take your medications and inhalers as prescribed and continue to try and cut down on smoking. Please remember to use your Advair and Spiriva inhalers daily, as well as the Albuterol and Nebulizer as needed. We have refilled several of your medications and these should be available for pickup at your pharmacy. Please continue to exercise.   For your nasal congestion you can try nasal irrigation with something like Neti-pot.  We have provided prescriptions for steroids and antibiotic to be filled as needed if you feel that your COPD is getting worse. Call us and let us know!  If you have any questions or concerns, please call the clinic at [phone] or if it is the weekend or after hours, you may call [phone] and ask for the internal medicine resident on call.

## 2016-09-04 NOTE — Assessment & Plan Note (Signed)
HbA1c 6.2 within goal, despite recent repeated steroid use for COPD. Continue current regimen of Metformin 1000 mg BID, reassess in 3 months

## 2016-09-04 NOTE — Assessment & Plan Note (Signed)
Continues to smoke 1/2 ppd which continues to worsen her COPD and leave her prone to URIs such as this cold. Is unsure if the Bupropion 300mg  is helping. Discussed continued need for smoking cessation. Patient amenable to trying nicotine patches to further curb her craving. Husband had good success with Chantix and asked about this as a next option.  Plan: - Provided Rx for Nicoderm patches - Consider weaning off Bupropion and switching to Chantix at future visit

## 2016-09-04 NOTE — Assessment & Plan Note (Signed)
States that she has come down with a cold, caught from a relative. Very congested and feels like her cough is worsening, reports increased sputum production and changing to light green. Reports using her nebulizer 1-2x daily. Continues to smoke 1/2 ppd and reports using "all of her inhalers" however when asked for specifics revealed has not been using her Spiriva inhaler. She does not have wheezing or crackles on exam with intermittent dry cough during our visit. She voices significant concern that her cold may worsen and cause an exacerbation/hospitalization.  Plan: - Given her frequent exacerbations will provide printed Rx for 5 days prednisone and z-pack to fill as needed, if symptoms continue to worsen - Encouraged to use her inhalers as prescribed, particularly Advair BID and Spiriva QD - Provided printed Rx for guaifenesin-codeine cough syrup which has provided significant symptom improvement in the past - Encouraged smoking cessation

## 2016-09-04 NOTE — Assessment & Plan Note (Signed)
>>  ASSESSMENT AND PLAN FOR CHRONIC OBSTRUCTIVE PULMONARY DISEASE WITH BRONCHOSPASM ([place]) WRITTEN ON 09/04/2016 11:54 AM BY Wynetta Emery, ADAM, MD  States that she has come down with a cold, caught from a relative. Very congested and feels like her cough is worsening, reports increased sputum production and changing to light green. Reports using her nebulizer 1-2x daily. Continues to smoke 1/2 ppd and reports using "all of her inhalers" however when asked for specifics revealed has not been using her Spiriva inhaler. She does not have wheezing or crackles on exam with intermittent dry cough during our visit. She voices significant concern that her cold may worsen and cause an exacerbation/hospitalization.  Plan: - Given her frequent exacerbations will provide printed Rx for 5 days prednisone and z-pack to fill as needed, if symptoms continue to worsen - Encouraged to use her inhalers as prescribed, particularly Advair BID and Spiriva QD - Provided printed Rx for guaifenesin-codeine cough syrup which has provided significant symptom improvement in the past - Encouraged smoking cessation

## 2016-09-07 NOTE — Progress Notes (Signed)
Internal Medicine Clinic Attending  I saw and evaluated the patient.  I personally confirmed the key portions of the history and exam documented by Dr. Johnson and I reviewed pertinent patient test results.  The assessment, diagnosis, and plan were formulated together and I agree with the documentation in the resident's note.  

## 2016-10-02 ENCOUNTER — Other Ambulatory Visit: Payer: Self-pay | Admitting: Internal Medicine

## 2016-10-29 ENCOUNTER — Ambulatory Visit (INDEPENDENT_AMBULATORY_CARE_PROVIDER_SITE_OTHER): Payer: Medicare Other | Admitting: Pulmonary Disease

## 2016-10-29 VITALS — BP 169/85 | HR 74 | Temp 98.5°F | Ht 66.5 in | Wt 217.0 lb

## 2016-10-29 DIAGNOSIS — F17210 Nicotine dependence, cigarettes, uncomplicated: Secondary | ICD-10-CM

## 2016-10-29 DIAGNOSIS — M1711 Unilateral primary osteoarthritis, right knee: Secondary | ICD-10-CM | POA: Diagnosis not present

## 2016-10-29 MED ORDER — MELOXICAM 15 MG PO TABS: 15.0000 mg | ORAL_TABLET | Freq: Every day | ORAL | 0 refills | Status: DC

## 2016-10-29 MED ORDER — KETOROLAC TROMETHAMINE 60 MG/2ML IM SOLN
60.0000 mg | Freq: Once | INTRAMUSCULAR | Status: DC
  Administered 2016-10-29: 60 mg via INTRAMUSCULAR

## 2016-10-29 NOTE — Progress Notes (Signed)
   CC: Right knee pain  HPI:  [name] is a 65 y.o. with history as noted below presenting with right knee pain.  Her right knee hurts and is swollen. She thinks she turned/pivoted on her knee wrong. She last fell a couple months ago. It is a little warm but not hot or erythematous. She has had pain like this before. Last had pain 4 months ago - got better with pain medicine. She has tried Tylenol which has not helped. She has not tried ice or heat. She has tried Voltaren gel as well - three times. This gives her some relief. It is a throbbing, shooting, aching pain. Denies bruising. Worse with moving. Does not matter what time of day. No locking. It wakes her up at night.   Past Medical History:  Diagnosis Date  . Anxiety   . Asthma   . Bronchitis    h/o  . Cancer of right breast ([place]) 1991   s/p lumpectomy, chemotherapy and radiation therapy in 1991. Mammogram in 2007 was normal.  . Constipated    h/o  . COPD (chronic obstructive pulmonary disease) ([place])    History of multiple hospital admissions for exercabation   . COPD with exacerbation ([place]) 04/06/2009   Qualifier: Diagnosis of  By: Eyvonne Mechanic MD, Vijay    . Depression   . Diarrhea    h/o  . GERD (gastroesophageal reflux disease)   . Headache    "a few times/month" (07/22/2016)  . Heart murmur 10/05/11   "first time I ever heard I had one was today"  . Hyperlipidemia   . Hypertension   . Obesity   . Pneumonia   . Shortness of breath 10/05/11   "at rest; lying down; w/exertion"  . Sigmoid diverticulitis 80/2008  . Tobacco abuse   . Type II diabetes mellitus (H[facility])     Review of Systems:   No fevers or chills No nausea/vomiting  Physical Exam:  Vitals:   10/29/16 1513  BP: (!) 169/85  Pulse: 74  Temp: 98.5 F (36.9 C)  TempSrc: Oral  SpO2: 100%  Weight: 217 lb (98.4 kg)  Height: 5' 6.5" (1.689 m)   General Apperance: NAD HEENT: Normocephalic, atraumatic, anicteric sclera Neck: Supple, trachea  midline Lungs: Clear to auscultation bilaterally. No wheezes, rhonchi or rales.  Heart: Regular rate and rhythm, no murmur/rub/gallop Abdomen: Soft, nontender, nondistended, no rebound/guarding Extremities: Warm and well perfused, no edema. Right knee tender to palpation along anterior aspect. No effusions, erythema, or warmth appreciated. Skin: No rashes or lesions Neurologic: Alert and interactive. No gross deficits.  Assessment & Plan:   See Encounters Tab for problem based charting.  Patient discussed with Dr. Daryll Drown

## 2016-10-29 NOTE — Patient Instructions (Signed)
You may start taking meloxicam tomorrow. Take one tablet daily. Follow up in 6 weeks

## 2016-10-29 NOTE — Assessment & Plan Note (Signed)
Assessment: Acute on chronic right knee pain. She has had an xray several years ago that demonstrated degenerative changes. Knee pain quality is not that of gout.  Plan Toradol 60mg  IM x1 meloxicam 15mg  daily for 7 to 10 days Follow up in 4 to 6 weeks

## 2016-11-03 NOTE — Progress Notes (Signed)
Internal Medicine Clinic Attending  Case discussed with Dr. Krall soon after the resident saw the patient.  We reviewed the resident's history and exam and pertinent patient test results.  I agree with the assessment, diagnosis, and plan of care documented in the resident's note. 

## 2016-11-10 ENCOUNTER — Other Ambulatory Visit: Payer: Self-pay | Admitting: Pulmonary Disease

## 2016-11-11 ENCOUNTER — Other Ambulatory Visit: Payer: Self-pay | Admitting: *Deleted

## 2016-11-11 DIAGNOSIS — E118 Type 2 diabetes mellitus with unspecified complications: Secondary | ICD-10-CM

## 2016-11-11 MED ORDER — SIMVASTATIN 40 MG PO TABS: 40.0000 mg | ORAL_TABLET | Freq: Every day | ORAL | 5 refills | Status: DC

## 2016-12-08 ENCOUNTER — Other Ambulatory Visit: Payer: Self-pay | Admitting: Pharmacist

## 2016-12-08 ENCOUNTER — Telehealth: Payer: Self-pay | Admitting: Pharmacist

## 2016-12-08 DIAGNOSIS — J441 Chronic obstructive pulmonary disease with (acute) exacerbation: Secondary | ICD-10-CM

## 2016-12-08 MED ORDER — MOMETASONE FURO-FORMOTEROL FUM 200-5 MCG/ACT IN AERO: 1.0000 | INHALATION_SPRAY | Freq: Two times a day (BID) | RESPIRATORY_TRACT | 0 refills | Status: DC

## 2016-12-08 MED ORDER — IPRATROPIUM-ALBUTEROL 0.5-2.5 (3) MG/3ML IN SOLN: 3.0000 mL | Freq: Four times a day (QID) | RESPIRATORY_TRACT | 9 refills | Status: DC | PRN

## 2016-12-08 NOTE — Progress Notes (Signed)
Patient called asking for help with inhalers (she was quoted ~$400 by pharmacy possibly due to deductible). Working to get patient access to inhalers. Will also process refill on Duoneb.

## 2016-12-09 ENCOUNTER — Ambulatory Visit: Payer: Medicare Other | Admitting: Pharmacist

## 2016-12-09 MED ORDER — IPRATROPIUM-ALBUTEROL 0.5-2.5 (3) MG/3ML IN SOLN: 3.0000 mL | Freq: Four times a day (QID) | RESPIRATORY_TRACT | 9 refills | Status: DC | PRN

## 2016-12-09 MED ORDER — BUDESONIDE 0.5 MG/2ML IN SUSP: 0.5000 mg | Freq: Two times a day (BID) | RESPIRATORY_TRACT | 12 refills | Status: DC

## 2016-12-09 MED FILL — IPRAT-ALBUT 0.5-3(2.5) MG/3: 0.5-2.5 (3) | 30 days supply | Qty: 360 | Fill #0

## 2016-12-09 NOTE — Progress Notes (Signed)
S: [name] is a 66 y.o. female who was seen for help with inhalers.  Allergies  Allergen Reactions  . Ace Inhibitors Swelling    Throat swelling.  [name] [Fluticasone] Other (See Comments)    Sinuses stop up and condition worsens   Medication Sig  ACCU-CHEK FASTCLIX LANCETS MISC 1 each by Other route See admin instructions. Check blood sugar daily as needed for high blood sugar.  acetaminophen (TYLENOL) 325 MG tablet Take 650 mg by mouth 2 (two) times daily as needed for headache.  albuterol (PROVENTIL HFA;VENTOLIN HFA) 108 (90 Base) MCG/ACT inhaler Inhale 2 puffs into the lungs every 6 (six) hours as needed for wheezing or shortness of breath.  aspirin 81 MG EC tablet Take 1 tablet (81 mg total) by mouth daily.  azithromycin (ZITHROMAX Z-PAK) 250 MG tablet Take two tablets on day 1, then one tablet daily for next 4 days.  Blood Glucose Monitoring Suppl (ACCU-CHEK NANO SMARTVIEW) W/DEVICE KIT 1 each by Other route See admin instructions. Check blood sugar daily as needed for high blood sugar.  budesonide (PULMICORT) 0.5 MG/2ML nebulizer solution Take 2 mLs (0.5 mg total) by nebulization 2 (two) times daily.  buPROPion (WELLBUTRIN XL) 300 MG 24 hr tablet Take 1 tablet (300 mg total) by mouth daily.  cetirizine (ZYRTEC) 10 MG tablet Take 10 mg by mouth daily.  citalopram (CELEXA) 10 MG tablet Take 1 tablet (10 mg total) by mouth daily.  Fluticasone-Salmeterol (ADVAIR DISKUS) 250-50 MCG/DOSE AEPB Inhale 1 puff into the lungs 2 (two) times daily.  glucose blood (ACCU-CHEK SMARTVIEW) test strip Use to check blood sugar 1 to 2 times daily. diag code E 11.9. Non- insulin dependent  guaiFENesin-codeine 100-10 MG/5ML syrup Take 5 mLs by mouth 3 (three) times daily as needed for cough.  ipratropium-albuterol (DUONEB) 0.5-2.5 (3) MG/3ML SOLN Take 3 mLs by nebulization every 6 (six) hours as needed.  losartan-hydrochlorothiazide (HYZAAR) 100-25 MG tablet Take 1 tablet by mouth daily.  Cancel previous Rx sent for 30-day supply  meloxicam (MOBIC) 15 MG tablet take 1 tablet by mouth once daily  metFORMIN (GLUCOPHAGE) 1000 MG tablet take 1 tablet by mouth twice a day  metoprolol (LOPRESSOR) 50 MG tablet Take 0.5 tablets (25 mg total) by mouth 2 (two) times daily.  nicotine (NICODERM CQ - DOSED IN MG/24 HR) 7 mg/24hr patch Place 1 patch (7 mg total) onto the skin daily.  predniSONE (DELTASONE) 20 MG tablet TAKE 2 TABLETS (40 MG) EACH DAY FOR FIVE DAYS, AS NEEDED  simvastatin (ZOCOR) 40 MG tablet Take 1 tablet (40 mg total) by mouth daily at 6 PM.  sodium chloride (OCEAN) 0.65 % SOLN nasal spray Place 1 spray into both nostrils as needed for congestion.  tiotropium (SPIRIVA HANDIHALER) 18 MCG inhalation capsule Place 1 capsule (18 mcg total) into inhaler and inhale daily.   Past Medical History:  Diagnosis Date  . Anxiety   . Asthma   . Bronchitis    h/o  . Cancer of right breast ([place]) 1991   s/p lumpectomy, chemotherapy and radiation therapy in 1991. Mammogram in 2007 was normal.  . Constipated    h/o  . COPD (chronic obstructive pulmonary disease) ([place])    History of multiple hospital admissions for exercabation   . COPD with exacerbation ([place]) 04/06/2009   Qualifier: Diagnosis of  By: Eyvonne Mechanic MD, Vijay    . Depression   . Diarrhea    h/o  . GERD (gastroesophageal reflux disease)   . Headache    "  a few times/month" (07/22/2016)  . Heart murmur 10/05/11   "first time I ever heard I had one was today"  . Hyperlipidemia   . Hypertension   . Obesity   . Pneumonia   . Shortness of breath 10/05/11   "at rest; lying down; w/exertion"  . Sigmoid diverticulitis 80/2008  . Tobacco abuse   . Type II diabetes mellitus ([place])    Social History   Social History  . Marital status: Married    Spouse name: N/A  . Number of children: N/A  . Years of education: 32   Occupational History  .  Unemployed   Social History Main Topics  . Smoking status: Current Some Day  Smoker    Packs/day: 0.25    Years: 40.00    Types: Cigarettes    Last attempt to quit: 09/11/2014  . Smokeless tobacco: Never Used  . Alcohol use Yes     Comment: 07/22/2016 "can of beer maybe twice/month"  . Drug use: No  . Sexual activity: Yes   Other Topics Concern  . Not on file   Social History Narrative   Lives in [place] with her husband.   Takes care of 3 grand children.   Trying to find a job, has financial difficulties.         Family History  Problem Relation Age of Onset  . Cancer Mother    O:    Component Value Date/Time   CHOL 145 04/06/2016 0854   HDL 44 04/06/2016 0854   TRIG 132 04/06/2016 0854   AST 15 05/08/2015 2343   ALT 12 (L) 05/08/2015 2343   NA 132 (L) 07/23/2016 1005   K 3.8 07/23/2016 1005   CL 96 (L) 07/23/2016 1005   CO2 24 07/23/2016 1005   GLUCOSE 274 (H) 07/23/2016 1005   HGBA1C 6.2 09/03/2016 1453   HGBA1C 7.6 (H) 03/09/2014 0555   BUN 9 07/23/2016 1005   CREATININE 0.87 07/23/2016 1005   CREATININE 0.73 12/20/2012 1110   CALCIUM 8.4 (L) 07/23/2016 1005   GFRAA >60 07/23/2016 1005   GFRAA >89 12/20/2012 1110   WBC 7.8 07/23/2016 1005   HGB 11.3 (L) 07/23/2016 1005   HCT 34.6 (L) 07/23/2016 1005   PLT 264 07/23/2016 1005   TSH 0.316 (L) 07/03/2015 1041   TSH 1.257 06/14/2014 1159   Ht Readings from Last 2 Encounters:  10/29/16 5' 6.5" (1.689 m)  09/03/16 _0  (1.702 m)   Wt Readings from Last 2 Encounters:  10/29/16 217 lb (98.4 kg)  09/03/16 202 lb 3.2 oz (91.7 kg)   There is no height or weight on file to calculate BMI. BP Readings from Last 3 Encounters:  10/29/16 (!) 169/85  09/03/16 128/75  07/29/16 128/73   A/P:  Patient states COPD is well-controlled and making progress toward smoking cessation, but she is out of inhalers and unable to afford due to deductible.   Worked with PCP to refill ipratropium-albuterol (Duoneb), price-checked budesonide neb solution but ~$500  Patient requested Duoneb transfer to  [facility], completed the transfer  Patient also needs albuterol refills, will facilitate.  Advised patient to work towards getting her deductible paid so her copay will be affordable, and continue making progress towards smoking cessation. Patient was also advised to follow up if any changes in condition or questions regarding medications arise.   The patient verbalized understanding of information provided by repeating back concepts discussed.

## 2016-12-10 MED ORDER — ALBUTEROL SULFATE HFA 108 (90 BASE) MCG/ACT IN AERS: 2.0000 | INHALATION_SPRAY | Freq: Four times a day (QID) | RESPIRATORY_TRACT | 2 refills | Status: DC | PRN

## 2016-12-10 NOTE — Addendum Note (Signed)
Addended by: Forde Dandy on: 12/10/2016 10:25 AM   Modules accepted: Orders

## 2016-12-31 ENCOUNTER — Other Ambulatory Visit: Payer: Self-pay | Admitting: Internal Medicine

## 2017-01-14 ENCOUNTER — Encounter: Payer: Self-pay | Admitting: Internal Medicine

## 2017-01-14 ENCOUNTER — Ambulatory Visit (INDEPENDENT_AMBULATORY_CARE_PROVIDER_SITE_OTHER): Payer: Medicare Other | Admitting: Internal Medicine

## 2017-01-14 ENCOUNTER — Ambulatory Visit (HOSPITAL_COMMUNITY)
Admission: RE | Admit: 2017-01-14 | Discharge: 2017-01-14 | Disposition: A | Discharge status: Home / Self Care | Payer: Medicare Other | Source: Ambulatory Visit | Attending: Internal Medicine | Admitting: Internal Medicine

## 2017-01-14 VITALS — BP 132/68 | HR 96 | Temp 99.0°F | Ht 66.5 in | Wt 219.1 lb

## 2017-01-14 DIAGNOSIS — M25561 Pain in right knee: Secondary | ICD-10-CM | POA: Insufficient documentation

## 2017-01-14 DIAGNOSIS — Z91120 Patient's intentional underdosing of medication regimen due to financial hardship: Secondary | ICD-10-CM

## 2017-01-14 DIAGNOSIS — G8929 Other chronic pain: Secondary | ICD-10-CM | POA: Diagnosis not present

## 2017-01-14 DIAGNOSIS — E669 Obesity, unspecified: Secondary | ICD-10-CM | POA: Diagnosis not present

## 2017-01-14 DIAGNOSIS — I10 Essential (primary) hypertension: Secondary | ICD-10-CM | POA: Diagnosis not present

## 2017-01-14 DIAGNOSIS — F17210 Nicotine dependence, cigarettes, uncomplicated: Secondary | ICD-10-CM | POA: Diagnosis not present

## 2017-01-14 DIAGNOSIS — J441 Chronic obstructive pulmonary disease with (acute) exacerbation: Secondary | ICD-10-CM

## 2017-01-14 DIAGNOSIS — F17200 Nicotine dependence, unspecified, uncomplicated: Secondary | ICD-10-CM

## 2017-01-14 DIAGNOSIS — Z6834 Body mass index (BMI) 34.0-34.9, adult: Secondary | ICD-10-CM | POA: Diagnosis not present

## 2017-01-14 DIAGNOSIS — M1711 Unilateral primary osteoarthritis, right knee: Secondary | ICD-10-CM

## 2017-01-14 DIAGNOSIS — Z79899 Other long term (current) drug therapy: Secondary | ICD-10-CM

## 2017-01-14 MED ORDER — PREDNISONE 20 MG PO TABS: ORAL_TABLET | ORAL | 0 refills | Status: DC

## 2017-01-14 MED ORDER — DICLOFENAC SODIUM 1 % TD GEL: 2.0000 g | Freq: Four times a day (QID) | TRANSDERMAL | 1 refills | Status: DC | PRN

## 2017-01-14 MED ORDER — AZITHROMYCIN 250 MG PO TABS: ORAL_TABLET | ORAL | 0 refills | Status: DC

## 2017-01-14 MED ORDER — METHYLPREDNISOLONE SODIUM SUCC 125 MG IJ SOLR
125.0000 mg | Freq: Once | INTRAMUSCULAR | Status: AC
  Administered 2017-01-14: 125 mg via INTRAMUSCULAR

## 2017-01-14 NOTE — Assessment & Plan Note (Addendum)
[place] and landed hard on her right knee about 3 months ago. Her osteoarthritis pain in that knee has been acting up much more. Making ambulation difficult and painful. She reports a lump on the right side of her knee. Last XR 04/2012 showed tricompartmental degenerative changes. Her over the counter medications are not controlling her pain.  Plan: - Obtain right knee xray today - Prescribed Voltaren gel for knee osteoarthritis pain - She is considering PT but ready for it at this point

## 2017-01-14 NOTE — Patient Instructions (Signed)
Please get your right knee xray today. We will call if there are any serious abnormalities.  We have provided a steroid shot today in clinic.  We have prescribed Prednisone and Azithromycin for the next 5 days, please take these medications.  We have provided a sample Albuterol rescue inhaler  We have provided a sample Trelegy Ellipta inhaler that you should use daily.  Please cut down on smoking, this is imperative for your lung health!!  Please continue to take your medications and inhalers as prescribed.  Return in 1 month!

## 2017-01-14 NOTE — Progress Notes (Signed)
   CC: Worsening shortness of breath and cough   HP  [name] is a 66 y.o. female with PMHx detailed below presenting for worsening shortness of breath, cough and COPD.  See problem based assessment and plan below for additional details.  Past Medical History:  Diagnosis Date  . Anxiety   . Asthma   . Bronchitis    h/o  . Cancer of right breast ([place]) 1991   s/p lumpectomy, chemotherapy and radiation therapy in 1991. Mammogram in 2007 was normal.  . Constipated    h/o  . COPD (chronic obstructive pulmonary disease) ([place])    History of multiple hospital admissions for exercabation   . COPD with exacerbation ([place]) 04/06/2009   Qualifier: Diagnosis of  By: Eyvonne Mechanic MD, Vijay    . Depression   . Diarrhea    h/o  . GERD (gastroesophageal reflux disease)   . Headache    "a few times/month" (07/22/2016)  . Heart murmur 10/05/11   "first time I ever heard I had one was today"  . Hyperlipidemia   . Hypertension   . Obesity   . Pneumonia   . Shortness of breath 10/05/11   "at rest; lying down; w/exertion"  . Sigmoid diverticulitis 80/2008  . Tobacco abuse   . Type II diabetes mellitus ([place])     Review of Systems: Review of Systems  Constitutional: Positive for chills and malaise/fatigue. Negative for fever.  Respiratory: Positive for cough, sputum production, shortness of breath and wheezing. Negative for hemoptysis.   Cardiovascular: Negative for chest pain, palpitations and orthopnea.  Gastrointestinal: Negative for abdominal pain, constipation, diarrhea, nausea and vomiting.  Musculoskeletal: Positive for back pain and joint pain. Negative for neck pain.  All other systems reviewed and are negative.    Physical Exam: Vitals:   01/14/17 1509 01/14/17 1609  BP: (!) 144/69 132/68  Pulse: 96   Temp: 99 F (37.2 C)   TempSrc: Oral   SpO2: 99%   Weight: 219 lb 1.6 oz (99.4 kg)   Height: 5' 6.5" (1.689 m)    Body mass index is 34.83 kg/m. GENERAL- Woman  sitting comfortably in exam room chair, appears tired, in no distress, dry cough HEENT- Atraumatic, moist mucous membranes CARDIAC- Regular rate and rhythm, no murmurs, rubs or gallops. RESP- Decreased breath sounds bilaterally, mild end expiratory wheeze, no rhonchi or crackles ABDOMEN- Obese, soft, nontender, nondistended BACK- Normal curvature, no paraspinal tenderness, lumbar spine tender to palpation EXTREMITIES- No edema, 2+ peripheral pulses SKIN- Warm, dry, intact, without visible rash PSYCH- Appropriate affect, clear speech, thoughts linear and goal-directed  Assessment & Plan:   See encounters tab for problem based medical decision making.  Patient discussed with Dr. Dareen Piano

## 2017-01-14 NOTE — Assessment & Plan Note (Signed)
BP 144/69 initial. 132/68 on recheck - at goal.  Plan: - Continue Losartan-HCTZ and Metoprolol at current dosing

## 2017-01-15 NOTE — Assessment & Plan Note (Signed)
Continues to smoke 0.5 ppd. Strongly encouraged cessation. Encouraged use of nicotine patch and gum.

## 2017-01-15 NOTE — Assessment & Plan Note (Signed)
Feels increasingly short of breath and fatigued all the time. Starting to disrupt her daily life. She has been out of all inhalers (Advair, Spiriva, Albuterol rescue inhaler) for a few months due to being unable to afford them. She has been using her nebulizer instead and the last few days has needed it every 4 hours. She is concerned that she would end up in the ED for respiratory distress soon so came in to clinic. She also report a cough that has become productive of white sputum. She continues to smoke 0.5 ppd. Her lungs are not tight, no fevers/chills. Lungs are clear to auscultation bilaterally but with decreased breath sounds and frequent cough.   Plan: - Solumedrol 125 mg x1 today - Prednisone 40 mg for 5 days - Azithromycin 500 mg day 1, then 250 mg for four days (Z-pack) - Provided Trelegy Ellipta inhaler (1 puff daily) sample to replace Advair with assistance from Dr. Maudie Mercury - Provided Albuterol rescue inhaler sample - Will schedule medication visit with Dr. Maudie Mercury to obtain medicare extra help financial assistance for her expensive inhalers

## 2017-01-15 NOTE — Assessment & Plan Note (Signed)
>>  ASSESSMENT AND PLAN FOR CHRONIC OBSTRUCTIVE PULMONARY DISEASE WITH BRONCHOSPASM ([place]) WRITTEN ON 01/15/2017 12:01 AM BY Wynetta Emery, ADAM, MD  Feels increasingly short of breath and fatigued all the time. Starting to disrupt her daily life. She has been out of all inhalers (Advair, Spiriva, Albuterol rescue inhaler) for a few months due to being unable to afford them. She has been using her nebulizer instead and the last few days has needed it every 4 hours. She is concerned that she would end up in the ED for respiratory distress soon so came in to clinic. She also report a cough that has become productive of white sputum. She continues to smoke 0.5 ppd. Her lungs are not tight, no fevers/chills. Lungs are clear to auscultation bilaterally but with decreased breath sounds and frequent cough.   Plan: - Solumedrol 125 mg x1 today - Prednisone 40 mg for 5 days - Azithromycin 500 mg day 1, then 250 mg for four days (Z-pack) - Provided Trelegy Ellipta inhaler (1 puff daily) sample to replace Advair with assistance from Dr. Maudie Mercury - Provided Albuterol rescue inhaler sample - Will schedule medication visit with Dr. Maudie Mercury to obtain medicare extra help financial assistance for her expensive inhalers

## 2017-01-17 NOTE — Progress Notes (Signed)
Internal Medicine Clinic Attending  Case discussed with Dr. Johnson at the time of the visit.  We reviewed the resident's history and exam and pertinent patient test results.  I agree with the assessment, diagnosis, and plan of care documented in the resident's note.  

## 2017-02-18 ENCOUNTER — Encounter: Payer: Medicare Other | Admitting: Internal Medicine

## 2017-02-18 ENCOUNTER — Ambulatory Visit: Payer: Medicare Other | Admitting: Pharmacist

## 2017-02-25 ENCOUNTER — Ambulatory Visit (INDEPENDENT_AMBULATORY_CARE_PROVIDER_SITE_OTHER): Payer: Medicare Other | Admitting: Internal Medicine

## 2017-02-25 VITALS — BP 147/66 | HR 79 | Temp 98.3°F | Ht 66.5 in | Wt 217.4 lb

## 2017-02-25 DIAGNOSIS — Z6834 Body mass index (BMI) 34.0-34.9, adult: Secondary | ICD-10-CM

## 2017-02-25 DIAGNOSIS — J302 Other seasonal allergic rhinitis: Secondary | ICD-10-CM

## 2017-02-25 DIAGNOSIS — E119 Type 2 diabetes mellitus without complications: Secondary | ICD-10-CM | POA: Diagnosis not present

## 2017-02-25 DIAGNOSIS — F17210 Nicotine dependence, cigarettes, uncomplicated: Secondary | ICD-10-CM

## 2017-02-25 DIAGNOSIS — E118 Type 2 diabetes mellitus with unspecified complications: Secondary | ICD-10-CM

## 2017-02-25 DIAGNOSIS — M25561 Pain in right knee: Secondary | ICD-10-CM | POA: Diagnosis not present

## 2017-02-25 DIAGNOSIS — H449 Unspecified disorder of globe: Secondary | ICD-10-CM | POA: Diagnosis not present

## 2017-02-25 DIAGNOSIS — J301 Allergic rhinitis due to pollen: Secondary | ICD-10-CM

## 2017-02-25 DIAGNOSIS — Z7984 Long term (current) use of oral hypoglycemic drugs: Secondary | ICD-10-CM

## 2017-02-25 DIAGNOSIS — Z91120 Patient's intentional underdosing of medication regimen due to financial hardship: Secondary | ICD-10-CM | POA: Diagnosis not present

## 2017-02-25 DIAGNOSIS — J441 Chronic obstructive pulmonary disease with (acute) exacerbation: Secondary | ICD-10-CM

## 2017-02-25 DIAGNOSIS — E669 Obesity, unspecified: Secondary | ICD-10-CM

## 2017-02-25 DIAGNOSIS — G8929 Other chronic pain: Secondary | ICD-10-CM

## 2017-02-25 DIAGNOSIS — F17200 Nicotine dependence, unspecified, uncomplicated: Secondary | ICD-10-CM

## 2017-02-25 HISTORY — DX: Other seasonal allergic rhinitis: J30.2

## 2017-02-25 LAB — POCT GLYCOSYLATED HEMOGLOBIN (HGB A1C): Hemoglobin A1C: 6.4

## 2017-02-25 LAB — GLUCOSE, CAPILLARY: Glucose-Capillary: 144 mg/dL — ABNORMAL HIGH (ref 65–99)

## 2017-02-25 MED ORDER — LEVOCETIRIZINE DIHYDROCHLORIDE 5 MG PO TABS: 5.0000 mg | ORAL_TABLET | Freq: Every evening | ORAL | 2 refills | Status: DC

## 2017-02-25 MED ORDER — ALBUTEROL SULFATE HFA 108 (90 BASE) MCG/ACT IN AERS: 2.0000 | INHALATION_SPRAY | Freq: Four times a day (QID) | RESPIRATORY_TRACT | 2 refills | Status: DC | PRN

## 2017-02-25 MED ORDER — SALINE SPRAY 0.65 % NA SOLN: 1.0000 | NASAL | 1 refills | Status: DC | PRN

## 2017-02-25 MED ORDER — METFORMIN HCL 1000 MG PO TABS: 1000.0000 mg | ORAL_TABLET | Freq: Two times a day (BID) | ORAL | 3 refills | Status: DC

## 2017-02-25 MED ORDER — MELOXICAM 15 MG PO TABS: 15.0000 mg | ORAL_TABLET | Freq: Every day | ORAL | 1 refills | Status: DC

## 2017-02-25 MED ORDER — SIMVASTATIN 40 MG PO TABS: 40.0000 mg | ORAL_TABLET | Freq: Every day | ORAL | 5 refills | Status: DC

## 2017-02-25 MED ORDER — IPRATROPIUM-ALBUTEROL 0.5-2.5 (3) MG/3ML IN SOLN: 3.0000 mL | Freq: Four times a day (QID) | RESPIRATORY_TRACT | 9 refills | Status: DC | PRN

## 2017-02-25 NOTE — Patient Instructions (Addendum)
Please continue taking your medications, inhalers, and nebulizers as prescribed.  We have provided an Anoro Ellipta inhaler sample that you should use daily.  We have sent a prescription for Xyzal anti-allergy medication, once daily to your pharmacy.  Please schedule an appointment to meet with Dr. Maudie Mercury at your earliest convenience to discuss financial assistance for your medications and inhalers.  Please continue to work on quitting smoking, as this is the single most important thing you can do for your health!

## 2017-02-25 NOTE — Progress Notes (Signed)
   CC: COPD follow up  HPI:  [name] is a 66 y.o. female with PMHx detailed below presenting for follow up of her COPD. Her chronic cough and dyspnea has been significantly improved from previous since receiving a controller inhaler last visit.  See problem based assessment and plan below for additional details.  Past Medical History:  Diagnosis Date  . Anxiety   . Asthma   . Bronchitis    h/o  . Cancer of right breast ([place]) 1991   s/p lumpectomy, chemotherapy and radiation therapy in 1991. Mammogram in 2007 was normal.  . Constipated    h/o  . COPD (chronic obstructive pulmonary disease) ([place])    History of multiple hospital admissions for exercabation   . COPD with exacerbation ([place]) 04/06/2009   Qualifier: Diagnosis of  By: Eyvonne Mechanic MD, Vijay    . Depression   . Diarrhea    h/o  . GERD (gastroesophageal reflux disease)   . Headache    "a few times/month" (07/22/2016)  . Heart murmur 10/05/11   "first time I ever heard I had one was today"  . Hyperlipidemia   . Hypertension   . Obesity   . Pneumonia   . Shortness of breath 10/05/11   "at rest; lying down; w/exertion"  . Sigmoid diverticulitis 80/2008  . Tobacco abuse   . Type II diabetes mellitus ([place])     Review of Systems: Review of Systems  Constitutional: Negative for chills, fever and weight loss.  HENT: Positive for congestion.   Respiratory: Positive for cough, shortness of breath and wheezing. Negative for sputum production.   Cardiovascular: Negative for chest pain, palpitations and leg swelling.  Gastrointestinal: Negative for abdominal pain, constipation, diarrhea, nausea and vomiting.  Musculoskeletal: Positive for back pain and joint pain.  Psychiatric/Behavioral: Positive for depression. The patient is not nervous/anxious and does not have insomnia.   All other systems reviewed and are negative.    Physical Exam: Vitals:   02/25/17 1327  BP: (!) 147/66  Pulse: 79  Temp: 98.3 F  (36.8 C)  TempSrc: Oral  SpO2: 100%  Weight: 217 lb 6.4 oz (98.6 kg)  Height: 5' 6.5" (1.689 m)   Body mass index is 34.56 kg/m. GENERAL- Well-dressed woman sitting comfortably in exam room chair, tired appearing, in no distress HEENT- Atraumatic, moist mucous membranes, oropharynx clear CARDIAC- Regular rate and rhythm, no murmurs, rubs or gallops. RESP- Diminished breath sounds bilaterally, no wheezing or crackles, normal work of breathing ABDOMEN- Obese, soft, nontender, nondistended BACK- Normal curvature, no paraspinal tenderness NEURO- Alert and oriented EXTREMITIES- Normal bulk and range of motion, no edema, 2+ peripheral pulses SKIN- Warm, dry, intact, without visible rash PSYCH- Blunted affect, clear speech, thoughts linear and goal-directed  Assessment & Plan:   See encounters tab for problem based medical decision making.  Patient discussed with Dr. Beryle Beams

## 2017-02-26 ENCOUNTER — Encounter: Payer: Self-pay | Admitting: Internal Medicine

## 2017-02-26 NOTE — Assessment & Plan Note (Signed)
HbA1c 6.4, well-controlled. Continue Metformin 1000 mg BID

## 2017-02-26 NOTE — Assessment & Plan Note (Signed)
Continues to smoke approx 0.5 ppd, working on cutting down.

## 2017-02-26 NOTE — Assessment & Plan Note (Addendum)
Reports improvement in knee pain with Voltaren gel. Continues to have intermittent pain with prolonged activity (walking dog, gardening, etc). Not ready to try PT at this time as she would have no ride while her husband is at work, would consider if she could have home health PT.   Plan: -- Continue Voltaren gel, refilled Rx Mobic 15 mg daily

## 2017-02-26 NOTE — Assessment & Plan Note (Signed)
>>  ASSESSMENT AND PLAN FOR CHRONIC OBSTRUCTIVE PULMONARY DISEASE WITH BRONCHOSPASM ([place]) WRITTEN ON 02/26/2017  2:17 AM BY Wynetta Emery, ADAM, MD  Chronic cough and dyspnea significantly improved since last visit - attributes this to Trelegy Ellipta she received from [place] last visit. Reports she continues to use her nebulizer 1-3x daily. Her exercise tolerance has improved and she is able to walk her dog and take care of yard work at home. Has not been able to find a way to afford her COPD inhalers as of yet, and she has no Trelegy Ellipta remaining.Marland Kitchen She was unable to meet with Dr. Maudie Mercury for medication financial assistance since last visit - had a misunderstanding about appointment times which resulted in cancellation of this appointment last week.   Plan: -- Provided Anoro Ellipta (1 puff daily) sample inhaler today (no Trelegy samples remaining) -- Refilled Albuterol inhaler and Duoneb solution Rx -- Advised to reschedule appointment with Dr. Maudie Mercury ASAP

## 2017-02-26 NOTE — Assessment & Plan Note (Addendum)
Chronic cough and dyspnea significantly improved since last visit - attributes this to Trelegy Ellipta she received from [place] last visit. Reports she continues to use her nebulizer 1-3x daily. Her exercise tolerance has improved and she is able to walk her dog and take care of yard work at home. Has not been able to find a way to afford her COPD inhalers as of yet, and she has no Trelegy Ellipta remaining.Marland Kitchen She was unable to meet with Dr. Maudie Mercury for medication financial assistance since last visit - had a misunderstanding about appointment times which resulted in cancellation of this appointment last week.   Plan: -- Provided Anoro Ellipta (1 puff daily) sample inhaler today (no Trelegy samples remaining) -- Refilled Albuterol inhaler and Duoneb solution Rx -- Advised to reschedule appointment with Dr. Maudie Mercury ASAP

## 2017-02-26 NOTE — Assessment & Plan Note (Addendum)
Reports history of seasonal allergies, particularly pollen, with significant congestion, sneezing, post-nasal drip, and itchy eyes this time of year (flowering tree season). Often exacerbates here chronic cough. Uses saline nasal spray occasionally, has been unable to tolerate Flonase in the past. Previously prescribed Zyrtec or Claritin, unsure of which, however was expensive and not covered by her insurance.   Plan: -- Prescribed Levocitirizine 5 mg QHS, which appears to be covered by her insurance

## 2017-02-28 NOTE — Progress Notes (Signed)
Medicine attending: Medical history, presenting problems, physical findings, and medications, reviewed with resident physician Dr Adam Johnson on the day of the patient visit and I concur with his evaluation and management plan. 

## 2017-03-11 ENCOUNTER — Telehealth: Payer: Self-pay | Admitting: Internal Medicine

## 2017-03-11 NOTE — Telephone Encounter (Signed)
I called pt back to see how she's doing since she c/o anxiety when talked to [place] earlier - no answer;left message.

## 2017-03-11 NOTE — Telephone Encounter (Signed)
Patient states her  ipratropium-albuterol (DUONEB) 0.5-2.5 (3) MG/3ML SOLN from her pharmacy requires a form from [place] must be completed in order to get her refill completed.  Form is in your box to be completed. Patient states she is completely out.

## 2017-03-14 ENCOUNTER — Ambulatory Visit: Payer: Medicare Other | Admitting: Pharmacist

## 2017-03-18 ENCOUNTER — Ambulatory Visit: Payer: Medicare Other | Admitting: Pharmacist

## 2017-03-18 ENCOUNTER — Encounter: Payer: Self-pay | Admitting: Pharmacist

## 2017-03-18 NOTE — Progress Notes (Signed)
S: [name] is a 66 y.o. female requested help with medications.  Allergies  Allergen Reactions  . Ace Inhibitors Swelling    Throat swelling.  [name] [Fluticasone] Other (See Comments)    Sinuses stop up and condition worsens   Medication Sig  ACCU-CHEK FASTCLIX LANCETS MISC 1 each by Other route See admin instructions. Check blood sugar daily as needed for high blood sugar.  acetaminophen (TYLENOL) 325 MG tablet Take 650 mg by mouth 2 (two) times daily as needed for headache.  albuterol (PROVENTIL HFA;VENTOLIN HFA) 108 (90 Base) MCG/ACT inhaler Inhale 2 puffs into the lungs every 6 (six) hours as needed for wheezing or shortness of breath.  Blood Glucose Monitoring Suppl (ACCU-CHEK NANO SMARTVIEW) W/DEVICE KIT 1 each by Other route See admin instructions. Check blood sugar daily as needed for high blood sugar.  buPROPion (WELLBUTRIN XL) 300 MG 24 hr tablet Take 1 tablet (300 mg total) by mouth daily.  citalopram (CELEXA) 10 MG tablet Take 1 tablet (10 mg total) by mouth daily.  diclofenac sodium (VOLTAREN) 1 % GEL Apply 2 g topically 4 (four) times daily as needed.  Fluticasone-Salmeterol (ADVAIR DISKUS) 250-50 MCG/DOSE AEPB Inhale 1 puff into the lungs 2 (two) times daily.  glucose blood (ACCU-CHEK SMARTVIEW) test strip Use to check blood sugar 1 to 2 times daily. diag code E 11.9. Non- insulin dependent  guaiFENesin-codeine 100-10 MG/5ML syrup Take 5 mLs by mouth 3 (three) times daily as needed for cough.  ipratropium-albuterol (DUONEB) 0.5-2.5 (3) MG/3ML SOLN Take 3 mLs by nebulization every 6 (six) hours as needed.  levocetirizine (XYZAL) 5 MG tablet Take 1 tablet (5 mg total) by mouth every evening.  losartan-hydrochlorothiazide (HYZAAR) 100-25 MG tablet Take 1 tablet by mouth daily. Cancel previous Rx sent for 30-day supply  meloxicam (MOBIC) 15 MG tablet Take 1 tablet (15 mg total) by mouth daily.  metFORMIN (GLUCOPHAGE) 1000 MG tablet Take 1 tablet (1,000 mg total) by  mouth 2 (two) times daily.  metoprolol (LOPRESSOR) 50 MG tablet Take 0.5 tablets (25 mg total) by mouth 2 (two) times daily.  RA ASPIRIN EC ADULT LOW ST 81 MG EC tablet take 1 tablet by mouth once daily  simvastatin (ZOCOR) 40 MG tablet Take 1 tablet (40 mg total) by mouth daily at 6 PM.  sodium chloride (OCEAN) 0.65 % SOLN nasal spray Place 1 spray into both nostrils as needed for congestion.  tiotropium (SPIRIVA HANDIHALER) 18 MCG inhalation capsule Place 1 capsule (18 mcg total) into inhaler and inhale daily.   Past Medical History:  Diagnosis Date  . Anxiety   . Asthma   . Bronchitis    h/o  . Cancer of right breast ([place]) 1991   s/p lumpectomy, chemotherapy and radiation therapy in 1991. Mammogram in 2007 was normal.  . Constipated    h/o  . COPD (chronic obstructive pulmonary disease) ([place])    History of multiple hospital admissions for exercabation   . COPD with exacerbation ([place]) 04/06/2009   Qualifier: Diagnosis of  By: Eyvonne Mechanic MD, Vijay    . Depression   . Diarrhea    h/o  . GERD (gastroesophageal reflux disease)   . Headache    "a few times/month" (07/22/2016)  . Heart murmur 10/05/11   "first time I ever heard I had one was today"  . Hyperlipidemia   . Hypertension   . Obesity   . Pneumonia   . Shortness of breath 10/05/11   "at rest; lying down; w/exertion"  .  Sigmoid diverticulitis 80/2008  . Tobacco abuse   . Type II diabetes mellitus ([facility])    Social History   Social History  . Marital status: Married    Spouse name: N/A  . Number of children: N/A  . Years of education: 12   Occupational History  .  Unemployed   Social History Main Topics  . Smoking status: Current Some Day Smoker    Packs/day: 0.25    Years: 40.00    Types: Cigarettes    Last attempt to quit: 09/11/2014  . Smokeless tobacco: Never Used  . Alcohol use Yes     Comment: 07/22/2016 "can of beer maybe twice/month"  . Drug use: No  . Sexual activity: Yes   Other Topics Concern  .  None   Social History Narrative   Lives in [place] with her husband.   Takes care of 3 grand children.   Trying to find a job, has financial difficulties.         Family History  Problem Relation Age of Onset  . Cancer Mother    O:    Component Value Date/Time   CHOL 145 04/06/2016 0854   HDL 44 04/06/2016 0854   TRIG 132 04/06/2016 0854   AST 15 05/08/2015 2343   ALT 12 (L) 05/08/2015 2343   NA 132 (L) 07/23/2016 1005   K 3.8 07/23/2016 1005   CL 96 (L) 07/23/2016 1005   CO2 24 07/23/2016 1005   GLUCOSE 274 (H) 07/23/2016 1005   HGBA1C 6.4 02/25/2017 1346   HGBA1C 7.6 (H) 03/09/2014 0555   BUN 9 07/23/2016 1005   CREATININE 0.87 07/23/2016 1005   CREATININE 0.73 12/20/2012 1110   CALCIUM 8.4 (L) 07/23/2016 1005   GFRAA >60 07/23/2016 1005   GFRAA >89 12/20/2012 1110   WBC 7.8 07/23/2016 1005   HGB 11.3 (L) 07/23/2016 1005   HCT 34.6 (L) 07/23/2016 1005   PLT 264 07/23/2016 1005   TSH 0.316 (L) 07/03/2015 1041   TSH 1.257 06/14/2014 1159   Ht Readings from Last 2 Encounters:  02/25/17 5' 6.5" (1.689 m)  01/14/17 5' 6.5" (1.689 m)   Wt Readings from Last 2 Encounters:  02/25/17 217 lb 6.4 oz (98.6 kg)  01/14/17 219 lb 1.6 oz (99.4 kg)   There is no height or weight on file to calculate BMI. BP Readings from Last 3 Encounters:  02/25/17 (!) 147/66  01/14/17 132/68  10/29/16 (!) 169/85    A/P:  Patient reports no symptoms of concern other than itchy eyes, recommended ketotifen eye drops.  She states she did run out of inhalers.  Medication Samples have been provided to the patient.  Trelegy and albuterol inhaler Strength: 90 mcg/puff Qty: 1 LOT: [id number] Exp.Date: July 2019 Dosing instructions: inhale 1 to 2 puffs every 4 to 6 hours as needed for shortness of breath  The patient has been instructed regarding the correct time, dose, and frequency of taking this medication, including desired effects and most common side effects.   An after visit summary  was provided and patient advised to follow up if any changes in condition or questions regarding medications arise.   The patient verbalized understanding of information provided by repeating back concepts discussed. 

## 2017-04-18 ENCOUNTER — Encounter: Payer: Self-pay | Admitting: *Deleted

## 2017-04-25 ENCOUNTER — Telehealth: Payer: Self-pay | Admitting: Pharmacist

## 2017-04-27 NOTE — Progress Notes (Signed)
Unable to reach.

## 2017-06-16 ENCOUNTER — Encounter: Payer: Self-pay | Admitting: Internal Medicine

## 2017-06-16 ENCOUNTER — Ambulatory Visit (INDEPENDENT_AMBULATORY_CARE_PROVIDER_SITE_OTHER): Payer: Medicare Other | Admitting: Internal Medicine

## 2017-06-16 VITALS — BP 119/79 | HR 111 | Temp 98.5°F | Ht 66.0 in | Wt 204.5 lb

## 2017-06-16 DIAGNOSIS — E119 Type 2 diabetes mellitus without complications: Secondary | ICD-10-CM

## 2017-06-16 DIAGNOSIS — J441 Chronic obstructive pulmonary disease with (acute) exacerbation: Secondary | ICD-10-CM | POA: Diagnosis not present

## 2017-06-16 DIAGNOSIS — F17210 Nicotine dependence, cigarettes, uncomplicated: Secondary | ICD-10-CM

## 2017-06-16 DIAGNOSIS — Z7951 Long term (current) use of inhaled steroids: Secondary | ICD-10-CM

## 2017-06-16 DIAGNOSIS — Z91120 Patient's intentional underdosing of medication regimen due to financial hardship: Secondary | ICD-10-CM

## 2017-06-16 DIAGNOSIS — Z720 Tobacco use: Secondary | ICD-10-CM

## 2017-06-16 DIAGNOSIS — T50996A Underdosing of other drugs, medicaments and biological substances, initial encounter: Secondary | ICD-10-CM

## 2017-06-16 DIAGNOSIS — Z809 Family history of malignant neoplasm, unspecified: Secondary | ICD-10-CM | POA: Diagnosis not present

## 2017-06-16 DIAGNOSIS — E118 Type 2 diabetes mellitus with unspecified complications: Secondary | ICD-10-CM

## 2017-06-16 DIAGNOSIS — Z716 Tobacco abuse counseling: Secondary | ICD-10-CM

## 2017-06-16 MED ORDER — SIMVASTATIN 40 MG PO TABS: 40.0000 mg | ORAL_TABLET | Freq: Every day | ORAL | 5 refills | Status: DC

## 2017-06-16 MED ORDER — METHYLPREDNISOLONE SODIUM SUCC 40 MG IJ SOLR
40.0000 mg | Freq: Once | INTRAMUSCULAR | Status: AC
  Administered 2017-06-16: 40 mg via INTRAMUSCULAR

## 2017-06-16 MED ORDER — TIOTROPIUM BROMIDE MONOHYDRATE 18 MCG IN CAPS: 18.0000 ug | ORAL_CAPSULE | Freq: Every day | RESPIRATORY_TRACT | 3 refills | Status: DC

## 2017-06-16 MED ORDER — ALBUTEROL SULFATE (2.5 MG/3ML) 0.083% IN NEBU
2.5000 mg | INHALATION_SOLUTION | Freq: Once | RESPIRATORY_TRACT | Status: AC
  Administered 2017-06-16: 2.5 mg via RESPIRATORY_TRACT

## 2017-06-16 NOTE — Assessment & Plan Note (Signed)
>>  ASSESSMENT AND PLAN FOR CHRONIC OBSTRUCTIVE PULMONARY DISEASE WITH BRONCHOSPASM ([place]) WRITTEN ON 06/16/2017  9:40 PM BY SANTOS-SANCHEZ, IDALYS, MD  Patient complaining of SOB, productive cough of white sputum, and decreased energy x 2 weeks. She has been doing albuterol nebs at home every 4 hours with no improvement in respiratory status. States she does not have SOB or a productive cough at baseline. She is not on any inhalers for her COPD because she can not afford them. On review of her chart, seems like Dr. Maudie Mercury has been providing her with free samples of different types of inhalers every time she comes for symptoms of COPD exacerbation. It also seems like she has presented multiple times a year (6-7) for COPD exacerbations for the past 2-3 years. This is her 3rd presentation this year. She continues to smoke 0.5 packs per day.   She is satting 100% on room air and her lungs are clear. She did not cough during interview or seemed SOB. PFTs from 5/17 consistent with mild obstruction and FEV1 71. l do not believe she is having an exacerbation of her COPD at this time. It is also unlikely that she has been having this many exacerbations per year given only mild obstruction on PFTs. Her presentation is more consistent with poor management of her COPD due to inability to afford inhalers.   - Breathing treatment and IM solumedrol 40mg  x1 per patient request. - No antibiotic therapy as low suspicion of COPD exacerbation   - She was provided with free samples of inhalers. Dr. Maudie Mercury will follow up with patient and try to help her obtain a LAMA at a low cost  - Encouraged smoking cessation. Explained benefits, though patient states she is not ready to quit at this time.  - She will follow up with me in 4-6 weeks for regular check up as she has not had a healthcare maintenance visit in over 1 year

## 2017-06-16 NOTE — Patient Instructions (Addendum)
You were seen today for shortness of breath. You received a breathing treatment with albuterol nebulizer and a shot of steroids (solumedrol).  Our pharmacist, Dr. Maudie Mercury, provided you with free samples of inhalers for your COPD. She will get in touch about possibly obtaining inhalers for a low cost.   Please follow up with me in 4-6 weeks.

## 2017-06-16 NOTE — Assessment & Plan Note (Addendum)
Patient complaining of SOB, productive cough of white sputum, and decreased energy x 2 weeks. She has been doing albuterol nebs at home every 4 hours with no improvement in respiratory status. States she does not have SOB or a productive cough at baseline. She is not on any inhalers for her COPD because she can not afford them. On review of her chart, seems like Dr. Maudie Mercury has been providing her with free samples of different types of inhalers every time she comes for symptoms of COPD exacerbation. It also seems like she has presented multiple times a year (6-7) for COPD exacerbations for the past 2-3 years. This is her 3rd presentation this year. She continues to smoke 0.5 packs per day.   She is satting 100% on room air and her lungs are clear. She did not cough during interview or seemed SOB. PFTs from 5/17 consistent with mild obstruction and FEV1 71. l do not believe she is having an exacerbation of her COPD at this time. It is also unlikely that she has been having this many exacerbations per year given only mild obstruction on PFTs. Her presentation is more consistent with poor management of her COPD due to inability to afford inhalers.   - Breathing treatment and IM solumedrol 40mg  x1 per patient request. - No antibiotic therapy as low suspicion of COPD exacerbation   - She was provided with free samples of inhalers. Dr. Maudie Mercury will follow up with patient and try to help her obtain a LAMA at a low cost  - Encouraged smoking cessation. Explained benefits, though patient states she is not ready to quit at this time.  - She will follow up with me in 4-6 weeks for regular check up as she has not had a healthcare maintenance visit in over 1 year

## 2017-06-16 NOTE — Progress Notes (Signed)
   CC: Shortness of breath, productive cough, and decreased energy   HPI:  [name] is a 66 y.o. female with PMH as detailed below who present to clinic with SOB, productive cough, and decreased energy. Please see problem oriented assessment and plan for further details.   Past Medical History:  Diagnosis Date  . Anxiety   . Asthma   . Bronchitis    h/o  . Cancer of right breast ([place]) 1991   s/p lumpectomy, chemotherapy and radiation therapy in 1991. Mammogram in 2007 was normal.  . Constipated    h/o  . COPD (chronic obstructive pulmonary disease) ([place])    History of multiple hospital admissions for exercabation   . COPD with exacerbation ([place]) 04/06/2009   Qualifier: Diagnosis of  By: Eyvonne Mechanic MD, Vijay    . Depression   . Diarrhea    h/o  . GERD (gastroesophageal reflux disease)   . Headache    "a few times/month" (07/22/2016)  . Heart murmur 10/05/11   "first time I ever heard I had one was today"  . Hyperlipidemia   . Hypertension   . Obesity   . Pneumonia   . Shortness of breath 10/05/11   "at rest; lying down; w/exertion"  . Sigmoid diverticulitis 80/2008  . Tobacco abuse   . Type II diabetes mellitus ([place])    Review of Systems:   Review of Systems  Constitutional: Negative for chills, fever and weight loss.  HENT: Positive for congestion and sinus pain.   Respiratory: Positive for cough, sputum production, shortness of breath and wheezing. Negative for hemoptysis.   Cardiovascular: Negative for chest pain and palpitations.  Neurological: Positive for headaches.    Physical Exam:  Vitals:   06/16/17 1324  BP: 119/79  Pulse: (!) 111  Temp: 98.5 F (36.9 C)  TempSrc: Oral  SpO2: 100%  Weight: 204 lb 8 oz (92.8 kg)  Height: 5\' 6"  (1.676 m)   General:well-developed female sitting up in char in no acute distress, able to speak in full sentences  HENT: NCAT, neck supple and FROM, OP clear  Cardiac: regular rate and rhythm, nl S1/S2, no  murmurs, rubs or gallops  Pulm: decreased breath sounds throughout but CTAB, no wheezes or crackles, no increased work of breathing  Ext: warm and well perfused, no peripheral edema, 2+ DP pulses bilaterally  Psych: patient upset at times during interview     Assessment & Plan:   See Encounters Tab for problem based charting.  Patient seen with Dr. Angelia Mould

## 2017-06-16 NOTE — Assessment & Plan Note (Signed)
Refilled simvastatin.

## 2017-06-20 NOTE — Addendum Note (Signed)
Addended by: Joni Reining C on: 06/20/2017 03:08 PM   Modules accepted: Level of Service

## 2017-06-20 NOTE — Progress Notes (Signed)
Internal Medicine Clinic Attending  I saw and evaluated the patient.  I personally confirmed the key portions of the history and exam documented by Dr. Isac Sarna and I reviewed pertinent patient test results.  The assessment, diagnosis, and plan were formulated together and I agree with the documentation in the resident's note. On exam she appears in no distress, lungs with good air movement bilaterally, slight end expiratory wheezing.  Overall it appears this exacerbation is very mild and is due to medication non-adherence mostly due to cost.  We have asked Dr Maudie Mercury to assist with obtaining medications.  I also reviewed her previous PFT which are more consistent with mild to moderate obstructive disease.  Overall she may just need a LAMA + albuterol but it appears her therapy has been repeatedly increased due to exacerbations that are once again due to non-adherence.  In addition she continues to smoking 1/2 ppd, I spend a dedicated 3 minutes explaining the nature of COPD in relation to smoking and the importance of stopping smoking to preserve current lung function/prevent deterioration.

## 2017-07-18 DIAGNOSIS — Z23 Encounter for immunization: Secondary | ICD-10-CM | POA: Diagnosis not present

## 2017-08-04 ENCOUNTER — Other Ambulatory Visit: Payer: Self-pay | Admitting: *Deleted

## 2017-08-04 DIAGNOSIS — F17200 Nicotine dependence, unspecified, uncomplicated: Secondary | ICD-10-CM

## 2017-08-04 NOTE — Telephone Encounter (Signed)
Reviewed patient's chart. It appears that she was prescribed Wellbutrin 300 MG daily for tobacco abuse in October 2017 by Dr. Wynetta Emery with plan to wean her off of it and start Chantix. She was provided with 2 refills at that time. There is no record on chart that she continued to take this medication after this. I will not refill Wellbutrin at this time. She will have to be evaluated in clinic [facility] or with me) for a refill.

## 2017-08-05 NOTE — Telephone Encounter (Signed)
Thank you :)

## 2017-08-05 NOTE — Telephone Encounter (Signed)
Pt informed of Dr Jacqualine Code response; stated she will call back to schedule an appt.

## 2017-09-02 ENCOUNTER — Encounter: Payer: Medicare Other | Admitting: Internal Medicine

## 2017-09-12 ENCOUNTER — Ambulatory Visit: Payer: Medicare Other

## 2017-09-12 ENCOUNTER — Ambulatory Visit: Payer: Medicare Other | Admitting: Pharmacist

## 2017-09-15 ENCOUNTER — Ambulatory Visit (INDEPENDENT_AMBULATORY_CARE_PROVIDER_SITE_OTHER): Payer: Medicare Other | Admitting: Internal Medicine

## 2017-09-15 ENCOUNTER — Ambulatory Visit: Payer: Medicare Other | Admitting: Pharmacist

## 2017-09-15 ENCOUNTER — Encounter: Payer: Self-pay | Admitting: Internal Medicine

## 2017-09-15 ENCOUNTER — Other Ambulatory Visit: Payer: Self-pay

## 2017-09-15 VITALS — BP 174/70 | HR 69 | Temp 98.6°F | Ht 66.0 in | Wt 210.0 lb

## 2017-09-15 DIAGNOSIS — G8929 Other chronic pain: Secondary | ICD-10-CM

## 2017-09-15 DIAGNOSIS — F418 Other specified anxiety disorders: Secondary | ICD-10-CM

## 2017-09-15 DIAGNOSIS — F17200 Nicotine dependence, unspecified, uncomplicated: Secondary | ICD-10-CM

## 2017-09-15 DIAGNOSIS — K921 Melena: Secondary | ICD-10-CM | POA: Diagnosis not present

## 2017-09-15 DIAGNOSIS — M25561 Pain in right knee: Secondary | ICD-10-CM | POA: Diagnosis not present

## 2017-09-15 DIAGNOSIS — M1711 Unilateral primary osteoarthritis, right knee: Secondary | ICD-10-CM

## 2017-09-15 DIAGNOSIS — Z79899 Other long term (current) drug therapy: Secondary | ICD-10-CM

## 2017-09-15 LAB — SYNOVIAL CELL COUNT + DIFF, W/ CRYSTALS
Crystals, Fluid: NONE SEEN
EOSINOPHILS-SYNOVIAL: 0 % (ref 0–1)
Lymphocytes-Synovial Fld: 24 % — ABNORMAL HIGH (ref 0–20)
Monocyte-Macrophage-Synovial Fluid: 47 % — ABNORMAL LOW (ref 50–90)
NEUTROPHIL, SYNOVIAL: 29 % — AB (ref 0–25)
Other Cells-SYN: 0
WBC, SYNOVIAL: 364 /mm3 — AB (ref 0–200)

## 2017-09-15 MED ORDER — DICLOFENAC SODIUM 1 % TD GEL
2.0000 g | Freq: Four times a day (QID) | TRANSDERMAL | 1 refills | Status: DC | PRN
Start: 2017-09-15 — End: 2018-03-16

## 2017-09-15 MED ORDER — BUPROPION HCL ER (XL) 300 MG PO TB24: 300.0000 mg | ORAL_TABLET | Freq: Every day | ORAL | 2 refills | Status: DC

## 2017-09-15 NOTE — Progress Notes (Signed)
Co-visit 

## 2017-09-15 NOTE — Progress Notes (Signed)
CC: Anxiety, right knee pain, black stools  HPI:  [name] is a 66 y.o. female with PMHx detailed below presenting with a 3 week history of intermittent diarrhea and loose stools. She also notes about 2 weeks of increased right knee pain and swelling. She has felt her anxiety and depressed mood are worse since stopping wellbutrin and now she is even worse due to anxiety about her health.  See problem based assessment and plan below for additional details.  Primary osteoarthritis of right knee She has increased swelling, erythema, and pain in the right knee for the past 2-3 weeks. This hurts her worst with walking and she was transported to the clinic in a wheelchair due to severity. She thinks this is more swollen than it has been from her arthritis in the past several years at least. Improvement has been limited with voltaren gel during this time. She is agreeable to trying a steroid injection to the knee for symptom relief. Point of care ultrasound was used and identified fluid collection largest in the superior medial aspect of the knee joint. Plan: Right knee ultrasound guided aspiration and steroid injection Synovial fluid analysis - bland fluid with no crystals, some blood present Reordered voltaren gel PRN for knee pain  Depression with generalized anxiety Her symptoms have been worsened lately after discontinuing wellbutrin. She states there was a miscommunication that her wellbutrin have been for her depression but it was stopped 2 months ago. She has not been using this medication as a pharmacological aid for smoking cessation. Plan: Reordered wellbutrin 300mg  daily  Black stools She has been very concerned due to black appearing stools in the past month. On review, she had been taking pepto bismol nearly every day during this time. She feels that her mood contributes to her abdominal cramps and diarrhea. There was never any red or maroon colored stools, and I  recommended we do not need to test further unless she has new symptoms or has black stools when not taking iron-containing medications.    Past Medical History:  Diagnosis Date  . Anxiety   . Asthma   . Bronchitis    h/o  . Cancer of right breast ([place]) 1991   s/p lumpectomy, chemotherapy and radiation therapy in 1991. Mammogram in 2007 was normal.  . Constipated    h/o  . COPD (chronic obstructive pulmonary disease) ([place])    History of multiple hospital admissions for exercabation   . COPD with exacerbation ([place]) 04/06/2009   Qualifier: Diagnosis of  By: Eyvonne Mechanic MD, Vijay    . Depression   . Diarrhea    h/o  . GERD (gastroesophageal reflux disease)   . Headache    "a few times/month" (07/22/2016)  . Heart murmur 10/05/11   "first time I ever heard I had one was today"  . Hyperlipidemia   . Hypertension   . Obesity   . Pneumonia   . Shortness of breath 10/05/11   "at rest; lying down; w/exertion"  . Sigmoid diverticulitis 80/2008  . Tobacco abuse   . Type II diabetes mellitus ([place])     Review of Systems: Review of Systems  Constitutional: Negative for weight loss.  Respiratory: Negative for shortness of breath.   Cardiovascular: Positive for leg swelling. Negative for chest pain.  Gastrointestinal: Positive for diarrhea. Negative for abdominal pain, blood in stool and nausea.  Genitourinary: Negative for hematuria.  Musculoskeletal: Positive for joint pain. Negative for falls.  Neurological: Negative for dizziness and weakness.  Psychiatric/Behavioral: The patient is nervous/anxious.      Physical Exam: Vitals:   09/15/17 1417  BP: (!) 174/70  Pulse: 69  Temp: 98.6 F (37 C)  TempSrc: Oral  SpO2: 100%  Weight: 210 lb (95.3 kg)  Height: 5\' 6"  (1.676 m)   GENERAL- alert, co-operative, NAD HEENT- Atraumatic, oral mucosa appears moist, conjunctivae normal  CARDIAC- RRR, no murmurs, rubs or gallops. RESP- CTAB, no wheezes or crackles. ABDOMEN- Soft, nontender,  no guarding or rebound, normoactive bowel sounds present EXTREMITIES- Right knee with crepitus on ROM, warm and faintly erythematous, with small effusion present SKIN- Warm, dry, No rash or lesion. PSYCH- Appears somewhat fatigued and anxious, appropriate thought content and speech.   Assessment & Plan:   See encounters tab for problem based medical decision making.   Patient seen with Dr. Evette Doffing

## 2017-09-15 NOTE — Patient Instructions (Signed)
It was a pleasure to see you today [name].  Please call us back if you notice red blood in your bowel movements since that would be more concerning for blood. Also let us know if the knee pain and swelling get worse in the near future.  Please come back sometime in the next month or so to follow up about your blood pressure and anxiety.

## 2017-09-16 NOTE — Progress Notes (Signed)
Knee Arthrocentesis with Injection Procedure Note  Diagnosis: right knee osteoarthritis with effusion  Indications: Symptom relief from osteoarthritis and Diagnostic  Anesthesia: Lidocaine 2% without epinephrine  Procedure Details   Point of care ultrasound was used to identify the joint effusion and plan needle trajectory and depth. Consent was obtained for the procedure. The joint was prepped with Betadine. Local anesthesia was achieved with topical approximately 2 mLs of lidocaine. A 18 gauge needle was inserted into the superior aspect of the joint from a medial approach to access the suprapatellar pouch. 10 ml of serosanguinous fluid was removed from the joint and sent to the lab for analysis. 4 ml 1% lidocaine and 1 ml of Triamcinolone was then injected into the joint through the same needle. The needle was removed and the area cleansed and dressed.  Complications:  None; patient tolerated the procedure well.  Dr. Evette Doffing was present throughout the procedure.

## 2017-09-19 DIAGNOSIS — K921 Melena: Secondary | ICD-10-CM | POA: Insufficient documentation

## 2017-09-19 NOTE — Assessment & Plan Note (Signed)
She has been very concerned due to black appearing stools in the past month. On review, she had been taking pepto bismol nearly every day during this time. She feels that her mood contributes to her abdominal cramps and diarrhea. There was never any red or maroon colored stools, and I recommended we do not need to test further unless she has new symptoms or has black stools when not taking iron-containing medications.

## 2017-09-19 NOTE — Assessment & Plan Note (Signed)
Her symptoms have been worsened lately after discontinuing wellbutrin. She states there was a miscommunication that her wellbutrin have been for her depression but it was stopped 2 months ago. She has not been using this medication as a pharmacological aid for smoking cessation. Plan: Reordered wellbutrin 300mg  daily

## 2017-09-19 NOTE — Progress Notes (Signed)
Internal Medicine Clinic Attending  I saw and evaluated the patient.  I personally confirmed the key portions of the history and exam documented by Dr. Benjamine Mola and I reviewed pertinent patient test results.  The assessment, diagnosis, and plan were formulated together and I agree with the documentation in the resident's note.  I was present for the entirety of the procedure.   Long axis view of the suprapatellar pouch showing a small-moderate sized effusion which we were able to successfully drain.

## 2017-09-19 NOTE — Assessment & Plan Note (Signed)
>>  ASSESSMENT AND PLAN FOR GAD (GENERALIZED ANXIETY DISORDER) WRITTEN ON 09/19/2017  7:10 AM BY RICE, CHRISTOPHER W, MD  Her symptoms have been worsened lately after discontinuing wellbutrin. She states there was a miscommunication that her wellbutrin have been for her depression but it was stopped 2 months ago. She has not been using this medication as a pharmacological aid for smoking cessation. Plan: Reordered wellbutrin 300mg  daily

## 2017-09-19 NOTE — Assessment & Plan Note (Addendum)
She has increased swelling, erythema, and pain in the right knee for the past 2-3 weeks. This hurts her worst with walking and she was transported to the clinic in a wheelchair due to severity. She thinks this is more swollen than it has been from her arthritis in the past several years at least. Improvement has been limited with voltaren gel during this time. She is agreeable to trying a steroid injection to the knee for symptom relief. Point of care ultrasound was used and identified fluid collection largest in the superior medial aspect of the knee joint. Plan: Right knee ultrasound guided aspiration and steroid injection Synovial fluid analysis - bland fluid with no crystals, some blood present Reordered voltaren gel PRN for knee pain

## 2017-09-19 NOTE — Assessment & Plan Note (Deleted)
Flu shot given today

## 2017-10-21 ENCOUNTER — Encounter: Payer: Self-pay | Admitting: Internal Medicine

## 2017-10-21 ENCOUNTER — Encounter: Payer: Medicare Other | Admitting: Internal Medicine

## 2017-12-28 ENCOUNTER — Ambulatory Visit (INDEPENDENT_AMBULATORY_CARE_PROVIDER_SITE_OTHER): Payer: Medicare Other | Admitting: Internal Medicine

## 2017-12-28 ENCOUNTER — Encounter: Payer: Self-pay | Admitting: Internal Medicine

## 2017-12-28 VITALS — BP 166/99 | HR 110 | Temp 99.3°F | Ht 66.0 in | Wt 195.4 lb

## 2017-12-28 DIAGNOSIS — I10 Essential (primary) hypertension: Secondary | ICD-10-CM

## 2017-12-28 DIAGNOSIS — Z853 Personal history of malignant neoplasm of breast: Secondary | ICD-10-CM

## 2017-12-28 DIAGNOSIS — Z923 Personal history of irradiation: Secondary | ICD-10-CM | POA: Diagnosis not present

## 2017-12-28 DIAGNOSIS — H00015 Hordeolum externum left lower eyelid: Secondary | ICD-10-CM | POA: Diagnosis not present

## 2017-12-28 DIAGNOSIS — L731 Pseudofolliculitis barbae: Secondary | ICD-10-CM | POA: Diagnosis present

## 2017-12-28 DIAGNOSIS — Z9221 Personal history of antineoplastic chemotherapy: Secondary | ICD-10-CM

## 2017-12-28 DIAGNOSIS — F17210 Nicotine dependence, cigarettes, uncomplicated: Secondary | ICD-10-CM | POA: Diagnosis not present

## 2017-12-28 DIAGNOSIS — H109 Unspecified conjunctivitis: Secondary | ICD-10-CM | POA: Insufficient documentation

## 2017-12-28 DIAGNOSIS — Z79899 Other long term (current) drug therapy: Secondary | ICD-10-CM | POA: Diagnosis not present

## 2017-12-28 DIAGNOSIS — Z9011 Acquired absence of right breast and nipple: Secondary | ICD-10-CM

## 2017-12-28 DIAGNOSIS — R000 Tachycardia, unspecified: Secondary | ICD-10-CM | POA: Diagnosis not present

## 2017-12-28 DIAGNOSIS — Z0000 Encounter for general adult medical examination without abnormal findings: Secondary | ICD-10-CM

## 2017-12-28 MED ORDER — ERYTHROMYCIN 5 MG/GM OP OINT: TOPICAL_OINTMENT | OPHTHALMIC | 0 refills | Status: DC

## 2017-12-28 MED ORDER — LOSARTAN POTASSIUM-HCTZ 100-25 MG PO TABS: 1.0000 | ORAL_TABLET | Freq: Every day | ORAL | 0 refills | Status: DC

## 2017-12-28 MED ORDER — METOPROLOL TARTRATE 25 MG PO TABS: 25.0000 mg | ORAL_TABLET | Freq: Two times a day (BID) | ORAL | 0 refills | Status: DC

## 2017-12-28 NOTE — Patient Instructions (Signed)
[name] it was nice meeting you today.  -Use erythromycin ointment as instructed.  If your eye does not get better in the next 5-7 days or you experience problems with your vision, please call us right away so that we can refer you to an eye doctor.  -Diagnostic mammogram has been ordered.  Our office will get the study scheduled for you.  I would like to encourage you to get it done at your earliest convenience.  -You have also been referred for a screening colonoscopy.  -Your blood pressure medications have been refilled.    FOLLOW-UP INSTRUCTIONS When: 3-4 weeks For: Blood pressure follow-up What to bring: Medications

## 2017-12-29 ENCOUNTER — Encounter: Payer: Self-pay | Admitting: Gastroenterology

## 2017-12-29 NOTE — Progress Notes (Signed)
   CC: Bump in my underarm, eyelid swollen  HPI:  [name] is a 67 y.o. female with a past medical history of conditions listed below presenting to clinic complaining of a bump in her underarm area on the left.  She is also complaining of pain and swelling of her left lower eyelid. Please see problem based charting for the status of the patient's current and chronic medical conditions.   Past Medical History:  Diagnosis Date  . Anxiety   . Asthma   . Bronchitis    h/o  . Cancer of right breast ([place]) 1991   s/p lumpectomy, chemotherapy and radiation therapy in 1991. Mammogram in 2007 was normal.  . Constipated    h/o  . COPD (chronic obstructive pulmonary disease) ([place])    History of multiple hospital admissions for exercabation   . COPD with exacerbation ([place]) 04/06/2009   Qualifier: Diagnosis of  By: Eyvonne Mechanic MD, Vijay    . Depression   . Diarrhea    h/o  . GERD (gastroesophageal reflux disease)   . Headache    "a few times/month" (07/22/2016)  . Heart murmur 10/05/11   "first time I ever heard I had one was today"  . Hyperlipidemia   . Hypertension   . Obesity   . Pneumonia   . Shortness of breath 10/05/11   "at rest; lying down; w/exertion"  . Sigmoid diverticulitis 80/2008  . Tobacco abuse   . Type II diabetes mellitus ([place])    Review of Systems: Pertinent positives mentioned in HPI. Remainder of all ROS negative.   Physical Exam:  Vitals:   12/28/17 1001  BP: (!) 166/99  Pulse: (!) 110  Temp: 99.3 F (37.4 C)  TempSrc: Oral  SpO2: 100%  Weight: 195 lb 6.4 oz (88.6 kg)  Height: 5\' 6"  (1.676 m)   Physical Exam  Constitutional: She is oriented to person, place, and time. She appears well-developed and well-nourished.  HENT:  Head: Normocephalic and atraumatic.  Mouth/Throat: Oropharynx is clear and moist.  Eyes: EOM are normal. Pupils are equal, round, and reactive to light. Left eye exhibits discharge. Left conjunctiva is injected.  Left eye:  Lower eyelid appears mildly swollen.  Stye noted on lower eyelid in addition to purulent drainage.   Cardiovascular: Normal rate, regular rhythm and intact distal pulses.  Pulmonary/Chest: Effort normal and breath sounds normal. No respiratory distress. She has no wheezes. She has no rales.  No axillary lymphadenopathy appreciated.  No breast mass palpated bilaterally.  Abdominal: Soft. Bowel sounds are normal. She exhibits no distension. There is no tenderness.  Musculoskeletal: She exhibits no edema.  Neurological: She is alert and oriented to person, place, and time.  Skin: Skin is warm and dry.  Psychiatric:  Appears anxious Tearful    Assessment & Plan:   See Encounters Tab for problem based charting.  Patient seen with Dr. Angelia Mould

## 2017-12-29 NOTE — Assessment & Plan Note (Signed)
Referral for screening colonoscopy has been placed.

## 2017-12-29 NOTE — Progress Notes (Signed)
Internal Medicine Clinic Attending  I saw and evaluated the patient.  I personally confirmed the key portions of the history and exam documented by Dr. Marlowe Sax and I reviewed pertinent patient test results.  The assessment, diagnosis, and plan were formulated together and I agree with the documentation in the resident's note. On my exam: Dr Marlowe Sax as well as patient's husband were present as chaperones.  I could not appreciate any masses of her left (or right breast) right breast with mastectomy scar.  On further discussion with patient I suspect mass was due to ingrown hair (patietn reported arrose after shaving- small red, with drainage- now resolved). She should resume obtaining mammograms.   Her left eye does have a mildly purulent conjunctivitis, we discussed antibiotic ointment and return precautions.

## 2017-12-29 NOTE — Assessment & Plan Note (Addendum)
Patient appeared very anxious and was tearful.  Mentioned noticing a tiny bump in her left axilla a few weeks ago which she thinks might be due to shaving but is worried it could be cancer because she has a prior history of breast cancer.  She has a history of breast cancer status post lumpectomy with chemo and radiation in 1991.  Diagnostic mammogram in 2014 was BI-RADS 2 and repeat diagnostic mammogram was recommended in 1 year.  Patient expressed her guilt for not going for repeat mammograms as she is worried it might show cancer again.  On exam, did not appreciate any axillary lymphadenopathy or breast mass.  Explained to the patient that the tiny bump she noticed in her left axilla could possibly be an ingrown hair from shaving.  However, she does have a prior history of breast cancer which puts her at risk for recurrence and she needs to be monitored very closely.  Discussed repeating mammogram and patient agreed.

## 2017-12-29 NOTE — Assessment & Plan Note (Addendum)
Uncontrolled.  Current regimen includes losartan-hydrochlorothiazide 100-25 mg daily and metoprolol 25 mg twice daily.  Patient states she ran out of metoprolol a few weeks ago.  This could explain her mild tachycardia.  Plan -Medications have been refilled -Return for follow-up in 3-4 weeks

## 2017-12-29 NOTE — Assessment & Plan Note (Signed)
Patient is presenting with a 4-day history of swelling of her lower eyelid and noticing a stye plus purulent drainage from her left eye.  States it is difficult for her to open her eyes in the morning as the eyelids get stuck to each other. On exam, noted to have conjunctival injection with purulent drainage and a stye.  She is not experiencing any vision problems.  Explained to her that her symptoms are likely due to bacterial conjunctivitis.  Plan -Erythromycin ointment for 5-7 days -Warm compresses -Advised her to call the clinic if her symptoms do not improve or she has worsening pain/ problems with her vision.  She will need an ophthalmology referral in that case.

## 2017-12-29 NOTE — Assessment & Plan Note (Signed)
>>  ASSESSMENT AND PLAN FOR PERSONAL HISTORY OF BREAST CANCER WRITTEN ON 12/29/2017  9:06 AM BY Shela Leff, MD  Patient appeared very anxious and was tearful.  Mentioned noticing a tiny bump in her left axilla a few weeks ago which she thinks might be due to shaving but is worried it could be cancer because she has a prior history of breast cancer.  She has a history of breast cancer status post lumpectomy with chemo and radiation in 1991.  Diagnostic mammogram in 2014 was BI-RADS 2 and repeat diagnostic mammogram was recommended in 1 year.  Patient expressed her guilt for not going for repeat mammograms as she is worried it might show cancer again.  On exam, did not appreciate any axillary lymphadenopathy or breast mass.  Explained to the patient that the tiny bump she noticed in her left axilla could possibly be an ingrown hair from shaving.  However, she does have a prior history of breast cancer which puts her at risk for recurrence and she needs to be monitored very closely.  Discussed repeating mammogram and patient agreed.

## 2018-01-17 ENCOUNTER — Other Ambulatory Visit: Payer: Self-pay

## 2018-01-17 DIAGNOSIS — J441 Chronic obstructive pulmonary disease with (acute) exacerbation: Secondary | ICD-10-CM

## 2018-01-17 NOTE — Telephone Encounter (Signed)
ipratropium-albuterol (DUONEB) 0.5-2.5 (3) MG/3ML SOLN, REFILL REQUEST @ [facility] ON [address].

## 2018-01-19 MED ORDER — IPRATROPIUM-ALBUTEROL 0.5-2.5 (3) MG/3ML IN SOLN: 3.0000 mL | Freq: Four times a day (QID) | RESPIRATORY_TRACT | 3 refills | Status: DC | PRN

## 2018-01-20 ENCOUNTER — Encounter: Payer: Self-pay | Admitting: Internal Medicine

## 2018-01-20 ENCOUNTER — Encounter: Payer: Medicare Other | Admitting: Internal Medicine

## 2018-02-14 ENCOUNTER — Encounter: Payer: Medicare Other | Admitting: Gastroenterology

## 2018-02-14 NOTE — Addendum Note (Signed)
Addended by: Hulan Fray on: 02/14/2018 07:36 PM   Modules accepted: Orders

## 2018-03-08 ENCOUNTER — Other Ambulatory Visit: Payer: Self-pay | Admitting: Internal Medicine

## 2018-03-08 DIAGNOSIS — Z1231 Encounter for screening mammogram for malignant neoplasm of breast: Secondary | ICD-10-CM

## 2018-03-10 ENCOUNTER — Other Ambulatory Visit: Payer: Self-pay | Admitting: Internal Medicine

## 2018-03-10 DIAGNOSIS — I10 Essential (primary) hypertension: Secondary | ICD-10-CM

## 2018-03-10 DIAGNOSIS — E118 Type 2 diabetes mellitus with unspecified complications: Secondary | ICD-10-CM

## 2018-03-10 NOTE — Telephone Encounter (Signed)
Sent refills. I asked front desk to call patient to schedule follow up appt with me. Seems like she has uncontrolled HTN on previous visit and was supposed to follow up 2 months ago but never did.

## 2018-03-16 ENCOUNTER — Encounter: Payer: Self-pay | Admitting: Internal Medicine

## 2018-03-16 ENCOUNTER — Other Ambulatory Visit: Payer: Self-pay

## 2018-03-16 ENCOUNTER — Ambulatory Visit (INDEPENDENT_AMBULATORY_CARE_PROVIDER_SITE_OTHER): Payer: Medicare Other | Admitting: Internal Medicine

## 2018-03-16 VITALS — BP 184/92 | HR 98 | Temp 98.4°F | Ht 66.0 in | Wt 199.3 lb

## 2018-03-16 DIAGNOSIS — E785 Hyperlipidemia, unspecified: Secondary | ICD-10-CM

## 2018-03-16 DIAGNOSIS — T23211A Burn of second degree of right thumb (nail), initial encounter: Secondary | ICD-10-CM

## 2018-03-16 DIAGNOSIS — J302 Other seasonal allergic rhinitis: Secondary | ICD-10-CM | POA: Diagnosis not present

## 2018-03-16 DIAGNOSIS — F17210 Nicotine dependence, cigarettes, uncomplicated: Secondary | ICD-10-CM

## 2018-03-16 DIAGNOSIS — E1169 Type 2 diabetes mellitus with other specified complication: Secondary | ICD-10-CM

## 2018-03-16 DIAGNOSIS — Y273XXA Contact with hot household appliance, undetermined intent, initial encounter: Secondary | ICD-10-CM

## 2018-03-16 DIAGNOSIS — Y93G3 Activity, cooking and baking: Secondary | ICD-10-CM

## 2018-03-16 DIAGNOSIS — Z79899 Other long term (current) drug therapy: Secondary | ICD-10-CM

## 2018-03-16 DIAGNOSIS — E119 Type 2 diabetes mellitus without complications: Secondary | ICD-10-CM | POA: Diagnosis not present

## 2018-03-16 DIAGNOSIS — L209 Atopic dermatitis, unspecified: Secondary | ICD-10-CM | POA: Diagnosis not present

## 2018-03-16 DIAGNOSIS — J441 Chronic obstructive pulmonary disease with (acute) exacerbation: Secondary | ICD-10-CM

## 2018-03-16 DIAGNOSIS — I10 Essential (primary) hypertension: Secondary | ICD-10-CM | POA: Diagnosis not present

## 2018-03-16 DIAGNOSIS — Z0000 Encounter for general adult medical examination without abnormal findings: Secondary | ICD-10-CM

## 2018-03-16 LAB — HM DIABETES EYE EXAM

## 2018-03-16 MED ORDER — DIPHENHYDRAMINE HCL 25 MG PO CAPS: 25.0000 mg | ORAL_CAPSULE | Freq: Every evening | ORAL | 0 refills | Status: DC | PRN

## 2018-03-16 MED ORDER — TRIAMCINOLONE ACETONIDE 0.025 % EX LOTN: 2.0000 mL | TOPICAL_LOTION | Freq: Two times a day (BID) | CUTANEOUS | 0 refills | Status: DC | PRN

## 2018-03-16 MED ORDER — SIMVASTATIN 40 MG PO TABS: 40.0000 mg | ORAL_TABLET | Freq: Every day | ORAL | 1 refills | Status: DC

## 2018-03-16 MED ORDER — SILVER SULFADIAZINE 1 % EX CREA: 1.0000 "application " | TOPICAL_CREAM | Freq: Every day | CUTANEOUS | 0 refills | Status: DC

## 2018-03-16 NOTE — Patient Instructions (Signed)
I recommend starting an oral antihistamine such as benadryl 25mg  for your sinuses and skin itching. This can cause drowsiness so start taking it at night first and see if it does okay. I also prescribed a topical steroid you can apply to the itching areas up to twice daily.  I am prescribing a topical antibiotic cream you can use on your thumb for the next 7-10 days while it is healing to protect from infection.  I would like you to return in a few weeks so we can check your blood pressure again with your medicines on board.

## 2018-03-16 NOTE — Progress Notes (Signed)
CC: Right thumb burn, allergies, itching skin rash  HPI:  [name] is a 67 y.o. female with PMHx detailed below presenting after burning her thumb cooking eggs on Sunday 4 days ago.  Since that time she has had pain and a small amount of swelling in the right thumb.  She has not been using any particular treatment for this.  She has taken [facility] powders for pain relief 2 days and states it is slightly helpful.   She is also complaining of pruritic rash all over her arms for about a week.  She is also having some increased rhinitis and sinus congestion during the same time.  There is no large areas of erythema but raised bump of varying ages where she is itching badly.  She is also overdue for multiple health maintenance issues and did not take her blood pressure medicines today.  She requests her diabetic retinopathy screening exam be completed today as well.  See problem based assessment and plan below for additional details.  Hypertension, benign essential, goal below 140/90 Ms. Hoffman's blood pressure is uncontrolled today however she had not taken her medications and was presenting for new acute complaints today.  I reordered her current prescriptions and recommended follow-up within a few weeks after taking medicine daily. Current regimen: Metoprolol 25 mg twice daily Losartan-HCTZ 100-25 mg daily  Type 2 diabetes mellitus ([place]) In clinic screening today for retinopathy for history of uncomplicated diabetes without eye involvement.  Atopic dermatitis She is complaining of skin rash and itching worst across her arms.  Exam reveals varying age excoriations on extensor surfaces most consistent with atopic dermatitis.  She also has seasonal allergic symptoms with rhinitis. Plan: Start oral antihistamine Triamcinolone 0.1% cream twice daily as needed on affected areas of arms  Second degree burn of right thumb There is a partial-thickness burn with associated blistering but  no evidence of infection or nonviable tissue on exam.  This is unlikely to develop complication but she has a lot of anxiety about this injury. Plan: Topical Silvadene cream for a week Recommended using gauze/Band-Aid/glove while using her hands to prevent reinjury   Past Medical History:  Diagnosis Date  . Anxiety   . Asthma   . Bronchitis    h/o  . Cancer of right breast ([place]) 1991   s/p lumpectomy, chemotherapy and radiation therapy in 1991. Mammogram in 2007 was normal.  . Constipated    h/o  . COPD (chronic obstructive pulmonary disease) ([place])    History of multiple hospital admissions for exercabation   . COPD with exacerbation ([place]) 04/06/2009   Qualifier: Diagnosis of  By: Eyvonne Mechanic MD, Vijay    . Depression   . Diarrhea    h/o  . GERD (gastroesophageal reflux disease)   . Headache    "a few times/month" (07/22/2016)  . Heart murmur 10/05/11   "first time I ever heard I had one was today"  . Hyperlipidemia   . Hypertension   . Obesity   . Pneumonia   . Shortness of breath 10/05/11   "at rest; lying down; w/exertion"  . Sigmoid diverticulitis 80/2008  . Tobacco abuse   . Type II diabetes mellitus ([place])     Review of Systems: Review of Systems  Constitutional: Negative for chills and fever.  HENT: Positive for congestion. Negative for hearing loss.   Eyes: Negative for blurred vision.  Respiratory: Positive for cough and shortness of breath.   Cardiovascular: Negative for leg swelling.  Gastrointestinal:  Negative for abdominal pain.  Musculoskeletal: Negative for myalgias.  Skin: Positive for itching and rash.  Neurological: Negative for dizziness.  Endo/Heme/Allergies: Positive for environmental allergies.     Physical Exam: Vitals:   03/16/18 0903  BP: (!) 184/92  Pulse: 98  Temp: 98.4 F (36.9 C)  TempSrc: Oral  SpO2: 98%  Weight: 199 lb 4.8 oz (90.4 kg)  Height: 5\' 6"  (1.676 m)   GENERAL- alert, co-operative, NAD HEENT-no sinus tenderness to  palpation, no oropharyngeal erythema, conjunctivae are slightly injected CARDIAC- RRR, no murmurs, rubs or gallops. RESP- CTAB, no wheezes or crackles. EXTREMITIES-partial-thickness burn of the medial right thumb (image below)    SKIN-scattered excoriations at varying age of healing over the extensor surfaces of both arms worse in the forearm, spares palmar surfaces and digital webs no confluent areas of erythema PSYCH- Normal mood and affect, appropriate thought content and speech.   Assessment & Plan:   See encounters tab for problem based medical decision making.   Patient discussed with Dr. Beryle Beams

## 2018-03-17 LAB — BMP8+ANION GAP
ANION GAP: 16 mmol/L (ref 10.0–18.0)
BUN/Creatinine Ratio: 11 — ABNORMAL LOW (ref 12–28)
BUN: 8 mg/dL (ref 8–27)
CO2: 23 mmol/L (ref 20–29)
CREATININE: 0.76 mg/dL (ref 0.57–1.00)
Calcium: 9.2 mg/dL (ref 8.7–10.3)
Chloride: 101 mmol/L (ref 96–106)
GFR calc Af Amer: 94 mL/min/{1.73_m2} (ref >59)
GFR calc non Af Amer: 81 mL/min/{1.73_m2} (ref >59)
Glucose: 104 mg/dL — ABNORMAL HIGH (ref 65–99)
Potassium: 3.9 mmol/L (ref 3.5–5.2)
SODIUM: 140 mmol/L (ref 134–144)

## 2018-03-20 DIAGNOSIS — L209 Atopic dermatitis, unspecified: Secondary | ICD-10-CM

## 2018-03-20 DIAGNOSIS — T23211A Burn of second degree of right thumb (nail), initial encounter: Secondary | ICD-10-CM | POA: Insufficient documentation

## 2018-03-20 HISTORY — DX: Atopic dermatitis, unspecified: L20.9

## 2018-03-20 NOTE — Assessment & Plan Note (Signed)
There is a partial-thickness burn with associated blistering but no evidence of infection or nonviable tissue on exam.  This is unlikely to develop complication but she has a lot of anxiety about this injury. Plan: Topical Silvadene cream for a week Recommended using gauze/Band-Aid/glove while using her hands to prevent reinjury

## 2018-03-20 NOTE — Assessment & Plan Note (Addendum)
[name]'s blood pressure is uncontrolled today however she had not taken her medications and was presenting for new acute complaints today.  I reordered her current prescriptions and recommended follow-up within a few weeks after taking medicine daily. Current regimen: Metoprolol 25 mg twice daily Losartan-HCTZ 100-25 mg daily

## 2018-03-20 NOTE — Progress Notes (Signed)
Medicine attending: Medical history, presenting problems, physical findings, and medications, reviewed with resident physician Dr Christopher Rice on the day of the patient visit and I concur with his evaluation and management plan. 

## 2018-03-20 NOTE — Assessment & Plan Note (Signed)
She is complaining of skin rash and itching worst across her arms.  Exam reveals varying age excoriations on extensor surfaces most consistent with atopic dermatitis.  She also has seasonal allergic symptoms with rhinitis. Plan: Start oral antihistamine Triamcinolone 0.1% cream twice daily as needed on affected areas of arms

## 2018-03-20 NOTE — Assessment & Plan Note (Signed)
In clinic screening today for retinopathy for history of uncomplicated diabetes without eye involvement.

## 2018-03-31 ENCOUNTER — Ambulatory Visit
Admission: RE | Admit: 2018-03-31 | Discharge: 2018-03-31 | Disposition: A | Discharge status: Home / Self Care | Payer: Medicare Other | Source: Ambulatory Visit | Attending: Internal Medicine | Admitting: Internal Medicine

## 2018-03-31 DIAGNOSIS — Z1231 Encounter for screening mammogram for malignant neoplasm of breast: Secondary | ICD-10-CM

## 2018-03-31 HISTORY — DX: Personal history of antineoplastic chemotherapy: Z92.21

## 2018-03-31 HISTORY — DX: Personal history of irradiation: Z92.3

## 2018-04-05 ENCOUNTER — Telehealth: Payer: Self-pay | Admitting: Pharmacist

## 2018-04-07 ENCOUNTER — Encounter: Payer: Self-pay | Admitting: Dietician

## 2018-04-09 ENCOUNTER — Ambulatory Visit (HOSPITAL_COMMUNITY)
Admission: EM | Admit: 2018-04-09 | Discharge: 2018-04-09 | Disposition: A | Discharge status: Home / Self Care | Payer: Medicare Other | Attending: Family Medicine | Admitting: Family Medicine

## 2018-04-09 ENCOUNTER — Encounter (HOSPITAL_COMMUNITY): Payer: Self-pay | Admitting: Family Medicine

## 2018-04-09 DIAGNOSIS — L308 Other specified dermatitis: Secondary | ICD-10-CM | POA: Diagnosis not present

## 2018-04-09 MED ORDER — LEVOCETIRIZINE DIHYDROCHLORIDE 5 MG PO TABS: ORAL_TABLET | ORAL | 0 refills | Status: DC

## 2018-04-09 MED ORDER — METHYLPREDNISOLONE ACETATE 80 MG/ML IJ SUSP
INTRAMUSCULAR | Status: AC
  Filled 2018-04-09: qty 1

## 2018-04-09 MED ORDER — METHYLPREDNISOLONE ACETATE 80 MG/ML IJ SUSP
80.0000 mg | Freq: Once | INTRAMUSCULAR | Status: AC
  Administered 2018-04-09: 80 mg via INTRAMUSCULAR

## 2018-04-09 NOTE — ED Provider Notes (Signed)
[place]    CSN: [id number] Arrival date & time: 04/09/18  1211     History   Chief Complaint Chief Complaint  Patient presents with  . Rash    HPI [name] is a 67 y.o. female.   HPI  Pruritic rash on both forearms for a month.  She has used some triamcinolone 0.025 lotion.  It is not improving.  Severely pruritic.  She is tried some Benadryl.  This did not help. She has a history of eczema.  Her primary care provider felt this was eczema.  I discussed with her that since it is not improving there could be an alternate diagnosis such as scabies.  This made the patient panic attack.  The rash does not look typical for scabies, both in the distribution and in the type of lesion.  I told her then been treated for eczema and have her follow-up with her PCP in 2 weeks.  Past Medical History:  Diagnosis Date  . Anxiety   . Asthma   . Bronchitis    h/o  . Cancer of right breast ([place]) 1991   s/p lumpectomy, chemotherapy and radiation therapy in 1991. Mammogram in 2007 was normal.  . Constipated    h/o  . COPD (chronic obstructive pulmonary disease) ([place])    History of multiple hospital admissions for exercabation   . COPD with exacerbation ([place]) 04/06/2009   Qualifier: Diagnosis of  By: Eyvonne Mechanic MD, Vijay    . Depression   . Diarrhea    h/o  . GERD (gastroesophageal reflux disease)   . Headache    "a few times/month" (07/22/2016)  . Heart murmur 10/05/11   "first time I ever heard I had one was today"  . Hyperlipidemia   . Hypertension   . Obesity   . Personal history of chemotherapy   . Personal history of radiation therapy   . Pneumonia   . Shortness of breath 10/05/11   "at rest; lying down; w/exertion"  . Sigmoid diverticulitis 80/2008  . Tobacco abuse   . Type II diabetes mellitus [facility])     Patient Active Problem List   Diagnosis Date Noted  . Atopic dermatitis 03/20/2018  . Second degree burn of right thumb 03/20/2018  . Black stools  09/19/2017  . Seasonal allergies 02/25/2017  . Encounter for Papanicolaou smear of cervix 04/06/2016  . Right knee pain 05/23/2015  . COPD, frequent exacerbations ([place]) 12/24/2014  . QT prolongation 08/08/2014  . Dyslipidemia associated with type 2 diabetes mellitus ([place]) 06/14/2014  . Preventative health care 01/03/2013  . History of breast cancer 12/01/2012  . Primary osteoarthritis of right knee 09/29/2012  . Depression with generalized anxiety 12/30/2009  . Type 2 diabetes mellitus ([place]) 05/14/2009  . TOBACCO ABUSE 04/06/2009  . Hypertension, benign essential, goal below 140/90 04/06/2009    Past Surgical History:  Procedure Laterality Date  . ABDOMINAL HYSTERECTOMY    . ANTERIOR CERVICAL DECOMP/DISCECTOMY FUSION  2012   "Dr. Lynann Bologna  put plate in; did something to my vertebrae"  . BACK SURGERY    . BREAST LUMPECTOMY Right 1991  . DOBUTAMINE STRESS ECHO  08/2004   Inferior ischemia, normal LV systolic function, no significant CAD    OB History   None      Home Medications    Prior to Admission medications   Medication Sig Start Date End Date Taking? Authorizing Provider  ACCU-CHEK FASTCLIX LANCETS MISC 1 each by Other route See admin instructions. Check  blood sugar daily as needed for high blood sugar.    [provider]  acetaminophen (TYLENOL) 325 MG tablet Take 650 mg by mouth 2 (two) times daily as needed for headache.    [provider]  albuterol (PROVENTIL HFA;VENTOLIN HFA) 108 (90 Base) MCG/ACT inhaler Inhale 2 puffs into the lungs every 6 (six) hours as needed for wheezing or shortness of breath. 02/25/17   Asencion Partridge, MD  Blood Glucose Monitoring Suppl (ACCU-CHEK NANO SMARTVIEW) W/DEVICE KIT 1 each by Other route See admin instructions. Check blood sugar daily as needed for high blood sugar.    [provider]  buPROPion (WELLBUTRIN XL) 300 MG 24 hr tablet Take 1 tablet (300 mg total) daily by mouth. 09/15/17   Rice, Resa Miner,  MD  glucose blood (ACCU-CHEK SMARTVIEW) test strip Use to check blood sugar 1 to 2 times daily. diag code E 11.9. Non- insulin dependent 10/02/14   Sid Falcon, MD  ipratropium-albuterol (DUONEB) 0.5-2.5 (3) MG/3ML SOLN Take 3 mLs by nebulization every 6 (six) hours as needed. 01/19/18   Welford Roche, MD  levocetirizine (XYZAL) 5 MG tablet Take 2 x a day for the itching 04/09/18   Raylene Everts, MD  losartan-hydrochlorothiazide [facility]) 100-25 MG tablet TAKE 1 TABLET BY MOUTH EVERY DAY 03/10/18   Welford Roche, MD  metFORMIN (GLUCOPHAGE) 1000 MG tablet TAKE 1 TABLET BY MOUTH TWICE DAILY 03/10/18   Welford Roche, MD  metoprolol tartrate (LOPRESSOR) 25 MG tablet Take 1 tablet (25 mg total) by mouth 2 (two) times daily. 12/28/17   Shela Leff, MD  RA ASPIRIN EC ADULT LOW ST 81 MG EC tablet take 1 tablet by mouth once daily 01/04/17   Asencion Partridge, MD  silver sulfADIAZINE (SILVADENE) 1 % cream Apply 1 application topically daily. 03/16/18   Rice, Resa Miner, MD  simvastatin (ZOCOR) 40 MG tablet Take 1 tablet (40 mg total) by mouth daily at 6 PM. 03/16/18   Rice, Resa Miner, MD  Triamcinolone Acetonide 0.025 % LOTN Apply 2 mLs topically 2 (two) times daily as needed. 03/16/18   Collier Salina, MD    Family History Family History  Problem Relation Age of Onset  . Cancer Mother     Social History Social History   Tobacco Use  . Smoking status: Current Some Day Smoker    Packs/day: 0.25    Years: 40.00    Pack years: 10.00    Types: Cigarettes    Last attempt to quit: 09/11/2014    Years since quitting: 3.5  . Smokeless tobacco: Never Used  Substance Use Topics  . Alcohol use: Yes    Comment: 07/22/2016 "can of beer maybe twice/month"  . Drug use: No     Allergies   Ace inhibitors and Flonase [fluticasone]   Review of Systems Review of Systems  Constitutional: Negative for chills and fever.  HENT: Negative for ear pain and sore throat.     Eyes: Negative for pain and visual disturbance.  Respiratory: Negative for cough and shortness of breath.   Cardiovascular: Negative for chest pain and palpitations.  Gastrointestinal: Negative for abdominal pain and vomiting.  Genitourinary: Negative for dysuria and hematuria.  Musculoskeletal: Negative for arthralgias and back pain.  Skin: Positive for rash. Negative for color change.  Neurological: Negative for seizures and syncope.  Psychiatric/Behavioral: The patient is nervous/anxious.   All other systems reviewed and are negative.    Physical Exam Triage Vital Signs ED Triage Vitals [04/09/18 1246]  Enc Vitals Group     BP 137/70     Pulse Rate 87     Resp 18     Temp 98.6 F (37 C)     Temp src      SpO2 100 %     Weight      Height      Head Circumference      Peak Flow      Pain Score 0     Pain Loc      Pain Edu?      Excl. in [place]?    No data found.  Updated Vital Signs BP 137/70   Pulse 87   Temp 98.6 F (37 C)   Resp 18   SpO2 100%   Visual Acuity Right Eye Distance:   Left Eye Distance:   Bilateral Distance:    Right Eye Near:   Left Eye Near:    Bilateral Near:     Physical Exam  Constitutional: She appears well-developed and well-nourished. No distress.  HENT:  Head: Normocephalic and atraumatic.  Mouth/Throat: Oropharynx is clear and moist.  Eyes: Pupils are equal, round, and reactive to light. Conjunctivae are normal.  Neck: Normal range of motion.  Cardiovascular: Normal rate.  Pulmonary/Chest: Effort normal. No respiratory distress.  Abdominal: Soft. She exhibits no distension.  Musculoskeletal: Normal range of motion. She exhibits no edema.  Neurological: She is alert.  Skin: Skin is warm and dry.  The dorsums of both forearms have multiple excoriated lesions.  Small erythematous papules, 2 to 5 mm across.  No burrows.  No hand lesions.  Spares axilla.     UC Treatments / Results  Labs (all labs ordered are listed, but only  abnormal results are displayed) Labs Reviewed - No data to display  EKG None  Radiology No results found.  Procedures Procedures (including critical care time)  Medications Ordered in UC Medications  methylPREDNISolone acetate (DEPO-MEDROL) injection 80 mg (80 mg Intramuscular Given 04/09/18 1347)    Initial Impression / Assessment and Plan / UC Course  I have reviewed the triage vital signs and the nursing notes.  Pertinent labs & imaging results that were available during my care of the patient were reviewed by me and considered in my medical decision making (see chart for details).     There may be a component of neurotic dermatitis/picking.  We will put her on daily antihistamines and giving her a steroid shot.  Hopefully this helps.  Follow-up with PCP. Final Clinical Impressions(s) / UC Diagnoses   Final diagnoses:  Other eczema     Discharge Instructions     Take the xyzal for itching I gave you a shot of steroid to reduce the rash and itching See your doctor on the 14th    ED Prescriptions    Medication Sig Dispense Auth. Provider   levocetirizine (XYZAL) 5 MG tablet Take 2 x a day for the itching 20 tablet Raylene Everts, MD     Controlled Substance Prescriptions [place] Controlled Substance Registry consulted? No   Raylene Everts, MD 04/09/18 1440

## 2018-04-09 NOTE — Discharge Instructions (Addendum)
Take the xyzal for itching I gave you a shot of steroid to reduce the rash and itching See your doctor on the 14th

## 2018-04-09 NOTE — ED Triage Notes (Signed)
Pt here for rash all over x a month. She has been using cream that her PCP gave her but not better. sts very itchy.

## 2018-04-12 ENCOUNTER — Encounter: Payer: Self-pay | Admitting: Pharmacist

## 2018-04-12 DIAGNOSIS — J441 Chronic obstructive pulmonary disease with (acute) exacerbation: Secondary | ICD-10-CM

## 2018-04-12 NOTE — Telephone Encounter (Signed)
Called patient to follow up on medication help. Unable to reach

## 2018-04-12 NOTE — Progress Notes (Signed)
Added Trelegy to med list per med rec.

## 2018-04-21 ENCOUNTER — Ambulatory Visit: Payer: Medicare Other | Admitting: Pharmacist

## 2018-04-21 ENCOUNTER — Encounter: Payer: Medicare Other | Admitting: Internal Medicine

## 2018-04-27 ENCOUNTER — Other Ambulatory Visit: Payer: Self-pay

## 2018-04-27 ENCOUNTER — Ambulatory Visit (INDEPENDENT_AMBULATORY_CARE_PROVIDER_SITE_OTHER): Payer: Medicare Other | Admitting: Internal Medicine

## 2018-04-27 ENCOUNTER — Encounter: Payer: Self-pay | Admitting: Pharmacist

## 2018-04-27 ENCOUNTER — Encounter: Payer: Self-pay | Admitting: Internal Medicine

## 2018-04-27 ENCOUNTER — Ambulatory Visit: Payer: Medicare Other | Admitting: Pharmacist

## 2018-04-27 VITALS — BP 165/74 | HR 77 | Temp 98.3°F | Wt 192.3 lb

## 2018-04-27 VITALS — BP 165/74 | HR 77 | Temp 98.3°F | Ht 66.0 in | Wt 192.3 lb

## 2018-04-27 DIAGNOSIS — Z79899 Other long term (current) drug therapy: Secondary | ICD-10-CM

## 2018-04-27 DIAGNOSIS — F17210 Nicotine dependence, cigarettes, uncomplicated: Secondary | ICD-10-CM

## 2018-04-27 DIAGNOSIS — F419 Anxiety disorder, unspecified: Secondary | ICD-10-CM | POA: Diagnosis not present

## 2018-04-27 DIAGNOSIS — L209 Atopic dermatitis, unspecified: Secondary | ICD-10-CM

## 2018-04-27 DIAGNOSIS — F329 Major depressive disorder, single episode, unspecified: Secondary | ICD-10-CM

## 2018-04-27 DIAGNOSIS — R450 Nervousness: Secondary | ICD-10-CM

## 2018-04-27 DIAGNOSIS — J441 Chronic obstructive pulmonary disease with (acute) exacerbation: Secondary | ICD-10-CM

## 2018-04-27 MED ORDER — BETAMETHASONE VALERATE 0.1 % EX OINT
1.0000 | TOPICAL_OINTMENT | Freq: Two times a day (BID) | CUTANEOUS | 0 refills | Status: DC
Start: 2018-04-27 — End: 2018-09-01

## 2018-04-27 MED ORDER — HYDROXYZINE HCL 10 MG PO TABS: 10.0000 mg | ORAL_TABLET | Freq: Three times a day (TID) | ORAL | 0 refills | Status: DC | PRN

## 2018-04-27 NOTE — Assessment & Plan Note (Signed)
Patient reports rash on both of her arms which has been ongoing for at least a month.  She was seen in clinic last month and treated for atopic dermatitis with topical triamcinolone 0.1% twice daily as needed.  She was again seen in urgent care clinic and told she had eczema and was treated with a dose of Solu-Medrol in the clinic and prescribed an oral antihistamine and topical triamcinolone.  Patient says that she has had some mild relief with the medications but continues to have pruritus and visible rash on her arms.  She feels like she has some bumps underneath her skin at the lateral aspect near her right elbow.  She says that rash and itching is making her anxious and causing her nerves to flareup.  She has noticed a rash flares of when around heat or when washing dishes.  Her husband is with her and says he has not had any similar rash. A/P: Patient with rash on both of her arms.  She has some small patches of slight erythema on both of her arms without any excoriations.  She does have multiple dark scars on both of her arms.  I was unable to feel any bumps under her skin at the lateral aspect of her elbow that she mentioned.  Her symptoms are again consistent with atopic dermatitis.  She has been coming neurotic over her rash and pruritus and requesting something to help calm her down, specifically Valium. -Start betamethasone 0.1% twice a day for 2 weeks -Hydroxyzine 10 mg 3 times daily as needed for itching or anxiety -Advised to wear gloves when washing dishes and monitor for any potential irritants -We will refer to dermatology Follow-up with PCP on 05/12/2018 as scheduled

## 2018-04-27 NOTE — Patient Instructions (Signed)
It was a pleasure to see you Ms. Greenman.  I am prescribing a steroid lotion called Betamethasone to use twice a day for 2 weeks.  You may take Hydroxyzine (Atarax) three times a day as needed for anxiety or itching.  I am referring you to a skin doctor.  Please wear gloves when washing dishes.  Follow up with Dr. Frederico Hamman on 05/12/18 as scheduled.

## 2018-04-27 NOTE — Progress Notes (Signed)
Medicine attending: Medical history, presenting problems, physical findings, and medications, reviewed with resident physician Dr Vishal Patel on the day of the patient visit and I concur with his evaluation and management plan. 

## 2018-04-27 NOTE — Progress Notes (Signed)
CC: Rash  HPI:  [name] is a 67 y.o. female with PMH as listed below who presents for follow up evaluation of rash on her arms. Please see problem based charting for status of patient's chronic medical issues.  Atopic dermatitis Patient reports rash on both of her arms which has been ongoing for at least a month.  She was seen in clinic last month and treated for atopic dermatitis with topical triamcinolone 0.1% twice daily as needed.  She was again seen in urgent care clinic and told she had eczema and was treated with a dose of Solu-Medrol in the clinic and prescribed an oral antihistamine and topical triamcinolone.  Patient says that she has had some mild relief with the medications but continues to have pruritus and visible rash on her arms.  She feels like she has some bumps underneath her skin at the lateral aspect near her right elbow.  She says that rash and itching is making her anxious and causing her nerves to flareup.  She has noticed a rash flares of when around heat or when washing dishes.  Her husband is with her and says he has not had any similar rash. A/P: Patient with rash on both of her arms.  She has some small patches of slight erythema on both of her arms without any excoriations.  She does have multiple dark scars on both of her arms.  I was unable to feel any bumps under her skin at the lateral aspect of her elbow that she mentioned.  Her symptoms are again consistent with atopic dermatitis.  She has been coming neurotic over her rash and pruritus and requesting something to help calm her down, specifically Valium. -Start betamethasone 0.1% twice a day for 2 weeks -Hydroxyzine 10 mg 3 times daily as needed for itching or anxiety -Advised to wear gloves when washing dishes and monitor for any potential irritants -We will refer to dermatology Follow-up with PCP on 05/12/2018 as scheduled    Past Medical History:  Diagnosis Date  . Anxiety   . Asthma   .  Bronchitis    h/o  . Cancer of right breast ([place]) 1991   s/p lumpectomy, chemotherapy and radiation therapy in 1991. Mammogram in 2007 was normal.  . Constipated    h/o  . COPD (chronic obstructive pulmonary disease) ([place])    History of multiple hospital admissions for exercabation   . COPD with exacerbation ([place]) 04/06/2009   Qualifier: Diagnosis of  By: Eyvonne Mechanic MD, Vijay    . Depression   . Diarrhea    h/o  . GERD (gastroesophageal reflux disease)   . Headache    "a few times/month" (07/22/2016)  . Heart murmur 10/05/11   "first time I ever heard I had one was today"  . Hyperlipidemia   . Hypertension   . Obesity   . Personal history of chemotherapy   . Personal history of radiation therapy   . Pneumonia   . Shortness of breath 10/05/11   "at rest; lying down; w/exertion"  . Sigmoid diverticulitis 80/2008  . Tobacco abuse   . Type II diabetes mellitus ([place])    Review of Systems:   Review of Systems  Skin: Positive for itching and rash.  Psychiatric/Behavioral: Positive for depression. The patient is nervous/anxious.      Physical Exam:  Vitals:   04/27/18 1045  BP: (!) 165/74  Pulse: 77  Temp: 98.3 F (36.8 C)  TempSrc: Oral  SpO2: 100%  Weight: 192 lb 4.8 oz (87.2 kg)  Height: 5\' 6"  (1.676 m)   Physical Exam  Constitutional: She is oriented to person, place, and time. She appears well-developed and well-nourished.  HENT:  Head: Normocephalic and atraumatic.  Cardiovascular: Normal rate.  Neurological: She is alert and oriented to person, place, and time.  Skin: She is not diaphoretic.  Multiple scars both upper extremities. Slight patches of erythema on both forearms without excoriation, bleeding, or discharge. No palpable nodularity below skin. No obvious insect bite marks on the arms or webs of fingers.     Assessment & Plan:   See Encounters Tab for problem based charting.  Patient discussed with Dr. Beryle Beams

## 2018-04-27 NOTE — Progress Notes (Signed)
Patient was seen today in a co-visit between the physician and pharmacist.  See documentation under Dr. Serita Grit visit for details.

## 2018-05-12 ENCOUNTER — Ambulatory Visit: Payer: Medicare Other | Admitting: Pharmacist

## 2018-05-12 ENCOUNTER — Encounter: Payer: Self-pay | Admitting: Internal Medicine

## 2018-05-12 ENCOUNTER — Encounter: Payer: Medicare Other | Admitting: Internal Medicine

## 2018-05-12 NOTE — Progress Notes (Deleted)
   CC: T2DM, HTN, tobacco abuse, and rash follow up   HPI:  [name] is a 67 y.o. female with PMH listed below who presents to clinic for T2DM, HTN, tobacco abuse, and rash follow up.   T2DM:   HTN:   Rash:  Tobacco abuse:   HLD:   History of breast cancer:     Past Medical History:  Diagnosis Date  . Anxiety   . Asthma   . Bronchitis    h/o  . Cancer of right breast ([place]) 1991   s/p lumpectomy, chemotherapy and radiation therapy in 1991. Mammogram in 2007 was normal.  . Constipated    h/o  . COPD (chronic obstructive pulmonary disease) ([place])    History of multiple hospital admissions for exercabation   . COPD with exacerbation ([place]) 04/06/2009   Qualifier: Diagnosis of  By: Eyvonne Mechanic MD, Vijay    . Depression   . Diarrhea    h/o  . GERD (gastroesophageal reflux disease)   . Headache    "a few times/month" (07/22/2016)  . Heart murmur 10/05/11   "first time I ever heard I had one was today"  . Hyperlipidemia   . Hypertension   . Obesity   . Personal history of chemotherapy   . Personal history of radiation therapy   . Pneumonia   . Shortness of breath 10/05/11   "at rest; lying down; w/exertion"  . Sigmoid diverticulitis 80/2008  . Tobacco abuse   . Type II diabetes mellitus ([place])    Review of Systems:  ***  Physical Exam:  There were no vitals filed for this visit. ***  Assessment & Plan:   See Encounters Tab for problem based charting.  Patient {GC/GE:[id number]::"discussed with","seen with"} Dr. {NAMES:[id number]::"[place]","Gran[place]a","E. Hoffman","Klima","Mullen","Narendra","Raines","Vincent"}

## 2018-05-23 NOTE — Addendum Note (Signed)
Addended by: Hulan Fray on: 05/23/2018 06:25 PM   Modules accepted: Orders

## 2018-06-09 ENCOUNTER — Other Ambulatory Visit: Payer: Self-pay | Admitting: Internal Medicine

## 2018-06-09 DIAGNOSIS — L209 Atopic dermatitis, unspecified: Secondary | ICD-10-CM

## 2018-06-21 ENCOUNTER — Ambulatory Visit (INDEPENDENT_AMBULATORY_CARE_PROVIDER_SITE_OTHER): Payer: Medicare Other | Admitting: Internal Medicine

## 2018-06-21 VITALS — BP 139/82 | HR 72 | Wt 191.3 lb

## 2018-06-21 DIAGNOSIS — M1711 Unilateral primary osteoarthritis, right knee: Secondary | ICD-10-CM

## 2018-06-21 DIAGNOSIS — R634 Abnormal weight loss: Secondary | ICD-10-CM | POA: Diagnosis not present

## 2018-06-21 DIAGNOSIS — F17200 Nicotine dependence, unspecified, uncomplicated: Secondary | ICD-10-CM

## 2018-06-21 DIAGNOSIS — L299 Pruritus, unspecified: Secondary | ICD-10-CM

## 2018-06-21 DIAGNOSIS — F17210 Nicotine dependence, cigarettes, uncomplicated: Secondary | ICD-10-CM | POA: Diagnosis not present

## 2018-06-21 DIAGNOSIS — Z79899 Other long term (current) drug therapy: Secondary | ICD-10-CM | POA: Diagnosis not present

## 2018-06-21 DIAGNOSIS — E119 Type 2 diabetes mellitus without complications: Secondary | ICD-10-CM | POA: Diagnosis not present

## 2018-06-21 DIAGNOSIS — Z114 Encounter for screening for human immunodeficiency virus [HIV]: Secondary | ICD-10-CM

## 2018-06-21 DIAGNOSIS — Z6830 Body mass index (BMI) 30.0-30.9, adult: Secondary | ICD-10-CM | POA: Diagnosis not present

## 2018-06-21 DIAGNOSIS — I10 Essential (primary) hypertension: Secondary | ICD-10-CM

## 2018-06-21 DIAGNOSIS — X58XXXA Exposure to other specified factors, initial encounter: Secondary | ICD-10-CM

## 2018-06-21 DIAGNOSIS — J449 Chronic obstructive pulmonary disease, unspecified: Secondary | ICD-10-CM

## 2018-06-21 DIAGNOSIS — T148XXA Other injury of unspecified body region, initial encounter: Secondary | ICD-10-CM

## 2018-06-21 DIAGNOSIS — Z7984 Long term (current) use of oral hypoglycemic drugs: Secondary | ICD-10-CM

## 2018-06-21 LAB — POCT GLYCOSYLATED HEMOGLOBIN (HGB A1C): Hemoglobin A1C: 5.4 % (ref 4.0–5.6)

## 2018-06-21 LAB — GLUCOSE, CAPILLARY: Glucose-Capillary: 115 mg/dL — ABNORMAL HIGH (ref 70–99)

## 2018-06-21 MED ORDER — DICLOFENAC SODIUM 1 % TD GEL: 2.0000 g | Freq: Two times a day (BID) | TRANSDERMAL | 0 refills | Status: DC | PRN

## 2018-06-21 MED ORDER — MELOXICAM 7.5 MG PO TABS: 7.5000 mg | ORAL_TABLET | Freq: Every day | ORAL | 0 refills | Status: AC

## 2018-06-21 NOTE — Progress Notes (Signed)
CC: Follow up for itching, T2DM, HTN, COPD, and preventive care  HPI:  [name] is a 67 y.o. female with PMH listed below who presents to clinic for follow-up of itching, T2DM, HTN, and COPD.    Itching: Patient was recently seen for bilateral arm itching associated with atopic dermatitis 2 months ago. She was prescribed betamethasone cream and hydroxyzine which she has been using.  Her rash resolved but she is now experiencing " itching attacks" that she describes as bugs crawling under the skin.  These are unpredictable and affect all 4 extremities.  She cannot tell how long the last.  Has been using over-the-counter Benadryl with good results.  Denies recent illness, insect bites, new exposures, recent changes in diet, and drug use. No history of renal and liver disease. She shares a bed with her husband who is not experiencing any symptoms. On exam, I do not appreciate any rashes or lesions. No scratch marks noted. Differential at this time is very broad. Could be an allergic reaction to a certain food or other new exposure that she not able to identify. Endocrine disorders can be associated with pruritus as well specifically thyroid issues and diabetes (which she has a history of).  Will check TSH and A1c today. Less suspicious of a dermatological disorder as her symptoms are not associated with a rash.  Unlikely a drug reaction given no recent medication changes. - Continue symptomatic relief with OTC Benadryl as needed since it is helping with symptoms  - Asked patient to keep a diary of symptoms and food intake  - TSH nl and A1c 5.4, CBC without eosinophilia  - Can consider dermatology referral at next visit for further evaluation if symptoms worsen   R knee pain: Patient has a history of primary OA of R knee and presents today with worsening pain associated with activity. She reports pain worsens with ambulation. On exam, R knee appears grossly normal but movement elicits pain.  No signs of infection noted. She has had a steroid injection in the past which helped with the pain and she is interested in one today.  - Asked patient to come back in 1 week for steroid injection as no providers present in clinic at the time of visit to perform injection  - Mobic 7.5 mg QD PRN in the meantime   Bruising: Patient has noticed bruising formation on her anterior thighs for the past 6 months. States bruises come and go without an inciting event and are only localizes to the distal part of her anterior thighs. Denies recent illness, trauma, previous history of bleeding and a family history of bleeding, nosebleeds, and gum bleeding. She does not have personal history of bleeding disorder and is not on anti-platelet or anticoagulation therapy. On exam she has multiple bruises of different ages localized to anterior thighs. These are not blanchable and there is no tenderness over the areas. Differential diagnosis includes trauma, platelet abnormality, coagulopathy, vasculopathy, etc. Suspect this is likely related to repetitive trauma given bruising is limited to one are of the body. Low suspicion for an underlying bleeding disorder as would expect diffuse bruising with this, but will order CBC to check platelet count. - CBC - normal  - Will continue to monitor   Weight loss: Patient presented for itching and R knee pain and was noted to have a 28 lb weight loss over the past year. This has been unintentional. She report no changes in appetite or in activity. She has  altered her diet some to avoid carbohydrates due to diabetes. Denies recent illness, fever, and chills. She does not appear emaciated on exam but does have some loose skin from weight loss. Will continue to monitor weight in future visits and check CMP, TSH, A1c, and HIV.  - CMP unremarkable, TSH nl, A1c 5.4 and HIV NR   T2DM, well-controlled: Last A1c 6.4 on 02/2017.  She takes on maximum dose of metformin and compliant.  Her A1c  today is 5.4.  States she has been eating less carbohydrates.  Denies polydipsia, polyuria, increased thirst. - Continue metformin 1000 mg BID  - Repeat A1c in 6 months  - Eye exam 03/2018 without retinopathy   Essential HTN, well-controlled: Previously uncontrolled on Hyzaar 100-25 mg and metoprolol 25 mg BID. BP today at goal while on same therapy regimen.  Suspect this is due to weight loss over the past year.  Will continue current regimen.  - Continue Hyzaar 100-25 mg QD and metoprolol 25 mg BID   Tobacco abuse: Patient smokes 0.5ppd for over 20 years. She is not interested in quitting at this time.  - Smoking counseling performed   Past Medical History:  Diagnosis Date  . Anxiety   . Asthma   . Bronchitis    h/o  . Cancer of right breast ([place]) 1991   s/p lumpectomy, chemotherapy and radiation therapy in 1991. Mammogram in 2007 was normal.  . Constipated    h/o  . COPD (chronic obstructive pulmonary disease) ([place])    History of multiple hospital admissions for exercabation   . COPD with exacerbation ([place]) 04/06/2009   Qualifier: Diagnosis of  By: Eyvonne Mechanic MD, Vijay    . Depression   . Diarrhea    h/o  . GERD (gastroesophageal reflux disease)   . Headache    "a few times/month" (07/22/2016)  . Heart murmur 10/05/11   "first time I ever heard I had one was today"  . Hyperlipidemia   . Hypertension   . Obesity   . Personal history of chemotherapy   . Personal history of radiation therapy   . Pneumonia   . Shortness of breath 10/05/11   "at rest; lying down; w/exertion"  . Sigmoid diverticulitis 80/2008  . Tobacco abuse   . Type II diabetes mellitus ([place])    Review of Systems:   Review of Systems  Constitutional: Positive for weight loss. Negative for chills, fever and malaise/fatigue.  Respiratory: Negative for cough and shortness of breath.   Cardiovascular: Negative for chest pain, palpitations and leg swelling.  Musculoskeletal: Positive for joint pain.  Skin:  Positive for itching. Negative for rash.  Neurological: Negative for dizziness, sensory change, weakness and headaches.    Physical Exam: Vitals:   06/21/18 1323  BP: 139/82  Pulse: 72  SpO2: 100%  Weight: 191 lb 4.8 oz (86.8 kg)    General: Well-appearing female in no acute distress HEENT: MMM, OP clear without exudates or erythema CV: RRR, normal S1/S2, no murmurs, rubs, or gallops Pulm: CTAB, no wheezes or crackles, no labored breathing Ext: Warm and well-perfused without cyanosis or edema MSK: R knee appears grossly normal without swelling or signs of infection with full range of motion  Assessment & Plan:   See Encounters Tab for problem based charting.  Patient discussed with Dr. Lynnae January

## 2018-06-21 NOTE — Patient Instructions (Addendum)
[name],   Please start taking meloxicam 7.5 mg 1 tablet every day as needed for knee pain. Please schedule an appointment for a knee injection.   For the itching, continue using hydroxyzine as needed as well. We got blood work today to see if there is anything causing this. We will let you know when we get the results.   Please keep a food diary and write down when you experience itching as well to see if this could be a food allergy.   Please call us if you have any questions or concerns.   - Dr. Frederico Hamman

## 2018-06-22 ENCOUNTER — Encounter: Payer: Self-pay | Admitting: Internal Medicine

## 2018-06-22 LAB — CBC WITH DIFFERENTIAL/PLATELET
BASOS ABS: 0 10*3/uL (ref 0.0–0.2)
Basos: 0 %
EOS (ABSOLUTE): 0.3 10*3/uL (ref 0.0–0.4)
Eos: 6 %
HEMOGLOBIN: 14.3 g/dL (ref 11.1–15.9)
Hematocrit: 42.5 % (ref 34.0–46.6)
IMMATURE GRANULOCYTES: 0 %
Immature Grans (Abs): 0 10*3/uL (ref 0.0–0.1)
LYMPHS: 31 %
Lymphocytes Absolute: 1.8 10*3/uL (ref 0.7–3.1)
MCH: 31 pg (ref 26.6–33.0)
MCHC: 33.6 g/dL (ref 31.5–35.7)
MCV: 92 fL (ref 79–97)
MONOCYTES: 6 %
Monocytes Absolute: 0.3 10*3/uL (ref 0.1–0.9)
NEUTROS ABS: 3.3 10*3/uL (ref 1.4–7.0)
Neutrophils: 57 %
Platelets: 291 10*3/uL (ref 150–450)
RBC: 4.62 x10E6/uL (ref 3.77–5.28)
RDW: 14.1 % (ref 12.3–15.4)
WBC: 5.8 10*3/uL (ref 3.4–10.8)

## 2018-06-22 LAB — CMP14 + ANION GAP
A/G RATIO: 1.6 (ref 1.2–2.2)
ALT: 8 IU/L (ref 0–32)
ANION GAP: 20 mmol/L — AB (ref 10.0–18.0)
AST: 12 IU/L (ref 0–40)
Albumin: 4.4 g/dL (ref 3.6–4.8)
Alkaline Phosphatase: 113 IU/L (ref 39–117)
BUN/Creatinine Ratio: 9 — ABNORMAL LOW (ref 12–28)
BUN: 9 mg/dL (ref 8–27)
Bilirubin Total: 0.6 mg/dL (ref 0.0–1.2)
CALCIUM: 9.6 mg/dL (ref 8.7–10.3)
CO2: 19 mmol/L — AB (ref 20–29)
CREATININE: 0.96 mg/dL (ref 0.57–1.00)
Chloride: 101 mmol/L (ref 96–106)
GFR calc Af Amer: 71 mL/min/{1.73_m2} (ref >59)
GFR, EST NON AFRICAN AMERICAN: 61 mL/min/{1.73_m2} (ref >59)
GLUCOSE: 106 mg/dL — AB (ref 65–99)
Globulin, Total: 2.7 g/dL (ref 1.5–4.5)
Potassium: 4.2 mmol/L (ref 3.5–5.2)
Sodium: 140 mmol/L (ref 134–144)
Total Protein: 7.1 g/dL (ref 6.0–8.5)

## 2018-06-22 LAB — TSH: TSH: 1.9 u[IU]/mL (ref 0.450–4.500)

## 2018-06-22 LAB — HIV ANTIBODY (ROUTINE TESTING W REFLEX): HIV Screen 4th Generation wRfx: NONREACTIVE

## 2018-06-22 NOTE — Assessment & Plan Note (Addendum)
T2DM, well-controlled: Last A1c 6.4 on 02/2017.  She takes on maximum dose of metformin and compliant.  Her A1c today is 5.4.  States she has been eating less carbohydrates.  Denies polydipsia, polyuria, increased thirst. - Continue metformin 1000 mg BID  - Repeat A1c in 6 months  - Eye exam in 03/2018 with no retinopathy

## 2018-06-22 NOTE — Assessment & Plan Note (Addendum)
Weight loss: Patient presented for itching and R knee pain and was noted to have a 28 lb weight loss over the past year. This has been unintentional. She report no changes in appetite or in activity. She has altered her diet some to avoid carbohydrates due to diabetes. Denies recent illness, fever, and chills. She does not appear emaciated on exam but does have some loose skin from weight loss. Will continue to monitor weight in future visits and check CMP, TSH, A1c, and HIV.  - CMP unremarkable, TSH nl, A1c 5.4 and HIV NR

## 2018-06-22 NOTE — Assessment & Plan Note (Signed)
Essential HTN, well-controlled: Previously uncontrolled on Hyzaar 100-25 mg and metoprolol 25 mg BID. BP today at goal while on same therapy regimen.  Suspect this is due to weight loss over the past year.  Will continue current regimen.  - Continue Hyzaar 100-25 mg QD and metoprolol 25 mg BID

## 2018-06-22 NOTE — Assessment & Plan Note (Signed)
R knee pain: Patient has a history of primary OA of R knee and presents today with worsening pain associated with activity. She reports pain worsens with ambulation. On exam, R knee appears grossly normal but movement elicits pain. No signs of infection noted. She has had a steroid injection in the past which helped with the pain and she is interested in one today.  - Asked patient to come back in 1 week for steroid injection as no providers present in clinic at the time of visit to perform injection  - Mobic 7.5 mg QD PRN in the meantime

## 2018-06-22 NOTE — Assessment & Plan Note (Signed)
Bruising: Patient has noticed bruising formation on her anterior thighs for the past 6 months. States bruises come and go without an inciting event and are only localizes to the distal part of her anterior thighs. Denies recent illness, trauma, previous history of bleeding and a family history of bleeding, nosebleeds, and gum bleeding. She does not have personal history of bleeding disorder and is not on anti-platelet or anticoagulation therapy. On exam she has multiple bruises of different ages localized to anterior thighs. These are not blanchable and there is no tenderness over the areas. Differential diagnosis includes trauma, platelet abnormality, coagulopathy, vasculopathy, etc. Suspect this is likely related to repetitive trauma given bruising is limited to one are of the body. Low suspicion for an underlying bleeding disorder as would expect diffuse bruising with this, but will order CBC to check platelet count. - CBC - normal  - Will continue to monitor

## 2018-06-22 NOTE — Assessment & Plan Note (Signed)
Itching: Patient was recently seen for bilateral arm itching associated with atopic dermatitis 2 months ago. She was prescribed betamethasone cream and hydroxyzine which she has been using.  Her rash resolved but she is now experiencing " itching attacks" that she describes as bugs crawling under the skin.  These are unpredictable and affect all 4 extremities.  She cannot tell how long the last.  Has been using over-the-counter Benadryl with good results.  Denies recent illness, insect bites, new exposures, recent changes in diet, and drug use. No history of renal and liver disease. She shares a bed with her husband who is not experiencing any symptoms. On exam, I do not appreciate any rashes or lesions. No scratch marks noted. Differential at this time is very broad. Could be an allergic reaction to a certain food or other new exposure that she not able to identify. Endocrine disorders can be associated with pruritus as well specifically thyroid issues and diabetes (which she has a history of).  Will check TSH and A1c today. Less suspicious of a dermatological disorder as her symptoms are not associated with a rash.  Unlikely a drug reaction given no recent medication changes. - Continue symptomatic relief with OTC Benadryl as needed since it is helping with symptoms  - Asked patient to keep a diary of symptoms and food intake  - TSH nl and A1c 5.4  - Can consider dermatology referral at next visit for further evaluation if symptoms worsen

## 2018-06-22 NOTE — Assessment & Plan Note (Signed)
Tobacco abuse: Patient smokes 0.5ppd for over 20 years. She is not interested in quitting at this time.  - Smoking counseling performed

## 2018-06-23 NOTE — Progress Notes (Signed)
Internal Medicine Clinic Attending  Case discussed with Dr. Santos at the time of the visit.  We reviewed the resident's history and exam and pertinent patient test results.  I agree with the assessment, diagnosis, and plan of care documented in the resident's note.    

## 2018-06-28 ENCOUNTER — Ambulatory Visit (INDEPENDENT_AMBULATORY_CARE_PROVIDER_SITE_OTHER): Payer: Medicare Other | Admitting: Internal Medicine

## 2018-06-28 VITALS — BP 141/92 | HR 108 | Temp 98.4°F | Wt 189.2 lb

## 2018-06-28 DIAGNOSIS — Z79899 Other long term (current) drug therapy: Secondary | ICD-10-CM

## 2018-06-28 DIAGNOSIS — J449 Chronic obstructive pulmonary disease, unspecified: Secondary | ICD-10-CM | POA: Diagnosis not present

## 2018-06-28 DIAGNOSIS — Z791 Long term (current) use of non-steroidal anti-inflammatories (NSAID): Secondary | ICD-10-CM | POA: Diagnosis not present

## 2018-06-28 DIAGNOSIS — F17210 Nicotine dependence, cigarettes, uncomplicated: Secondary | ICD-10-CM

## 2018-06-28 DIAGNOSIS — E119 Type 2 diabetes mellitus without complications: Secondary | ICD-10-CM | POA: Diagnosis not present

## 2018-06-28 DIAGNOSIS — I10 Essential (primary) hypertension: Secondary | ICD-10-CM | POA: Diagnosis not present

## 2018-06-28 DIAGNOSIS — E785 Hyperlipidemia, unspecified: Secondary | ICD-10-CM | POA: Diagnosis not present

## 2018-06-28 DIAGNOSIS — G8929 Other chronic pain: Secondary | ICD-10-CM | POA: Diagnosis not present

## 2018-06-28 DIAGNOSIS — M1711 Unilateral primary osteoarthritis, right knee: Secondary | ICD-10-CM | POA: Diagnosis not present

## 2018-06-28 DIAGNOSIS — M25561 Pain in right knee: Secondary | ICD-10-CM

## 2018-06-28 MED ORDER — CELECOXIB 200 MG PO CAPS: 200.0000 mg | ORAL_CAPSULE | Freq: Every day | ORAL | 1 refills | Status: AC | PRN

## 2018-06-28 NOTE — Progress Notes (Signed)
CC: Right knee steroid injection  HPI:  [name] is a 67 y.o. female with COPD, diabetes mellitus type 2, dyslipidemia, benign essential hypertension, primary osteoarthritis of right knee who presents for right knee injection. Please see problem based charting for evaluation, assessment, and plan.  Past Medical History:  Diagnosis Date  . Anxiety   . Asthma   . Bronchitis    h/o  . Cancer of right breast ([place]) 1991   s/p lumpectomy, chemotherapy and radiation therapy in 1991. Mammogram in 2007 was normal.  . Constipated    h/o  . COPD (chronic obstructive pulmonary disease) ([place])    History of multiple hospital admissions for exercabation   . COPD with exacerbation ([place]) 04/06/2009   Qualifier: Diagnosis of  By: Eyvonne Mechanic MD, Vijay    . Depression   . Diarrhea    h/o  . GERD (gastroesophageal reflux disease)   . Headache    "a few times/month" (07/22/2016)  . Heart murmur 10/05/11   "first time I ever heard I had one was today"  . Hyperlipidemia   . Hypertension   . Obesity   . Personal history of chemotherapy   . Personal history of radiation therapy   . Pneumonia   . Shortness of breath 10/05/11   "at rest; lying down; w/exertion"  . Sigmoid diverticulitis 80/2008  . Tobacco abuse   . Type II diabetes mellitus ([facility])    Review of Systems:    Has right knee pain Denies shortness of breath, chest pain, nausea, vomiting  Physical Exam:  Vitals:   06/28/18 1322  BP: (!) 141/92  Pulse: (!) 108  Temp: 98.4 F (36.9 C)  TempSrc: Oral  SpO2: 98%  Weight: 189 lb 3.2 oz (85.8 kg)   Physical Exam  Constitutional: She appears well-developed and well-nourished. No distress.  HENT:  Head: Normocephalic and atraumatic.  Eyes: Conjunctivae are normal.  Cardiovascular: Normal rate, regular rhythm and normal heart sounds.  Respiratory: Effort normal and breath sounds normal. No respiratory distress. She has no wheezes.  GI: Soft. Bowel sounds are normal.  She exhibits no distension. There is no tenderness.  Musculoskeletal: She exhibits no edema.  Warmth to palpation in right knee, swelling present over the suprapatellar region, 5/5 muscle strength in bilateral lower extremities, sensation intact in bilateral lower extremities  Neurological: She is alert.  Skin: She is not diaphoretic. No erythema.  Psychiatric: She has a normal mood and affect. Her behavior is normal. Judgment and thought content normal.    Knee Injection Procedure Note  Diagnosis: right knee  Indications: Symptom relief from osteoarthritis  Anesthesia: Lidocaine 1% without epinephrine  Procedure Details   Point of care ultrasound was used to identify the joint effusion and plan needle trajectory and depth. Consent was obtained for the procedure. The joint was prepped with Betadine. A 21 gauge needle was inserted into the superior aspect of the joint from a lateral approach to access the infrrapatellar pouch. 2 ml 1% lidocaine and 1 ml of Triamcinolone was then injected into the joint through the same needle. The needle was removed and the area cleansed and dressed.  Complications: The patient tolerated the procedure well  Assessment & Plan:   See Encounters Tab for problem based charting.   Right knee osteoarthritis The patient has right knee pain that has been present for the past 5-10 years now. She had a right knee x-ray done in March 2018 which showed chronic tricompartmental degenerative changes of her right  knee. She has had steroid injections in the past, the last was one year ago.   She was given Mobic 7.5 mg once daily as needed for her pain at her last visit which she states has not been helping her.   During her visit today the patient states that her knee pain is 7.5/10 intensity, throbbing/sharp in nature, present over the the suprapatellar region.   Assessment and plan The patient states that she I continuing to have pain over her right knee  consistent with osteoarthritic changes. Her muscle strength and sensation continue to remain intact. She has more pain when she places weight on her knee.   Gave patient intraarticular steroid injection. Prescribed patient celebrex 200mg  qd prn. Follow up in 3 months.   Patient discussed with Dr. Daryll Drown

## 2018-06-28 NOTE — Patient Instructions (Addendum)
It was a pleasure to see you today [name]. You received a knee injection today.   Please use celebrex 200mg  daily as needed for your knee pain   If you have any questions or concerns, please call our clinic at [phone] between 9am-5pm and after hours call [phone] and ask for the internal medicine resident on call. If you feel you are having a medical emergency please call 911.   Thank you, we look forward to help you remain healthy!  Lars Mage, MD Internal Medicine PGY2

## 2018-06-29 NOTE — Assessment & Plan Note (Signed)
The patient has right knee pain that has been present for the past 5-10 years now. She had a right knee x-ray done in March 2018 which showed chronic tricompartmental degenerative changes of her right knee. She has had steroid injections in the past, the last was one year ago.   She was given Mobic 7.5 mg once daily as needed for her pain at her last visit which she states has not been helping her.   During her visit today the patient states that her knee pain is 7.5/10 intensity, throbbing/sharp in nature, present over the the suprapatellar region.   Assessment and plan The patient states that she I continuing to have pain over her right knee consistent with osteoarthritic changes. Her muscle strength and sensation continue to remain intact. She has more pain when she places weight on her knee.   Gave patient intraarticular steroid injection. Prescribed patient celebrex 200mg  qd prn. Follow up in 3 months.

## 2018-07-02 NOTE — Progress Notes (Signed)
Internal Medicine Clinic Attending  I saw and evaluated the patient.  I personally confirmed the key portions of the history and exam documented by Dr. Maricela Bo and I reviewed pertinent patient test results.  The assessment, diagnosis, and plan were formulated together and I agree with the documentation in the resident's note.  I was present and assisted with knee injection.

## 2018-07-24 ENCOUNTER — Ambulatory Visit (HOSPITAL_COMMUNITY)
Admission: EM | Admit: 2018-07-24 | Discharge: 2018-07-24 | Disposition: A | Discharge status: Home / Self Care | Payer: Medicare Other | Attending: Family Medicine | Admitting: Family Medicine

## 2018-07-24 ENCOUNTER — Encounter (HOSPITAL_COMMUNITY): Payer: Self-pay | Admitting: Emergency Medicine

## 2018-07-24 DIAGNOSIS — J441 Chronic obstructive pulmonary disease with (acute) exacerbation: Secondary | ICD-10-CM | POA: Diagnosis not present

## 2018-07-24 MED ORDER — DOXYCYCLINE HYCLATE 100 MG PO CAPS: 100.0000 mg | ORAL_CAPSULE | Freq: Two times a day (BID) | ORAL | 0 refills | Status: AC

## 2018-07-24 MED ORDER — PREDNISONE 50 MG PO TABS: 50.0000 mg | ORAL_TABLET | Freq: Every day | ORAL | 0 refills | Status: AC

## 2018-07-24 MED ORDER — ALBUTEROL SULFATE HFA 108 (90 BASE) MCG/ACT IN AERS
INHALATION_SPRAY | RESPIRATORY_TRACT | Status: AC
  Filled 2018-07-24: qty 6.7

## 2018-07-24 MED ORDER — ALBUTEROL SULFATE HFA 108 (90 BASE) MCG/ACT IN AERS
1.0000 | INHALATION_SPRAY | Freq: Once | RESPIRATORY_TRACT | Status: AC
  Administered 2018-07-24: 1 via RESPIRATORY_TRACT

## 2018-07-24 MED ORDER — IPRATROPIUM-ALBUTEROL 0.5-2.5 (3) MG/3ML IN SOLN
RESPIRATORY_TRACT | Status: AC
  Filled 2018-07-24: qty 3

## 2018-07-24 MED ORDER — IPRATROPIUM-ALBUTEROL 0.5-2.5 (3) MG/3ML IN SOLN
3.0000 mL | Freq: Once | RESPIRATORY_TRACT | Status: AC
  Administered 2018-07-24: 3 mL via RESPIRATORY_TRACT

## 2018-07-24 MED ORDER — METHYLPREDNISOLONE SODIUM SUCC 125 MG IJ SOLR
80.0000 mg | Freq: Once | INTRAMUSCULAR | Status: AC
  Administered 2018-07-24: 80 mg via INTRAMUSCULAR

## 2018-07-24 MED ORDER — BENZONATATE 200 MG PO CAPS: 200.0000 mg | ORAL_CAPSULE | Freq: Three times a day (TID) | ORAL | 0 refills | Status: AC | PRN

## 2018-07-24 MED ORDER — METHYLPREDNISOLONE SODIUM SUCC 125 MG IJ SOLR
INTRAMUSCULAR | Status: AC
  Filled 2018-07-24: qty 2

## 2018-07-24 NOTE — Discharge Instructions (Signed)
We gave you 2 breathing treatments today as well as a injection of Solu-Medrol  Please use the albuterol inhaler as needed for significant shortness of breath or wheezing Begin doxycycline twice daily for the next 7 days Please use Tessalon for cough  Please follow-up here in the emergency room if developing worsening difficulty breathing, shortness of breath or chest discomfort for persistent symptoms without improvement

## 2018-07-24 NOTE — ED Provider Notes (Signed)
[place]    CSN: [id number] Arrival date & time: 07/24/18  0102     History   Chief Complaint Chief Complaint  Patient presents with  . Shortness of Breath    HPI [name] is a 67 y.o. female history of COPD, anxiety, hypertension, hyperlipidemia, tobacco use, DM type II presenting today for evaluation of COPD exacerbation, shortness of breath and wheezing.  Patient states that over the past couple weeks she has had worsening cough, wheezing and difficulty breathing.  She has been using her nebulizers at home without relief.  States that she is almost out of her inhalers.  She was unable to follow-up with her PCP due to availability today, but typically gets refills of inhalers from them.  Is unable to afford them.  She has not been using anything else.  Denies fevers.  Cough is productive.  Denies chest pain.  Patient has had accompanying congestion nasally, drainage.  HPI  Past Medical History:  Diagnosis Date  . Anxiety   . Asthma   . Bronchitis    h/o  . Cancer of right breast ([place]) 1991   s/p lumpectomy, chemotherapy and radiation therapy in 1991. Mammogram in 2007 was normal.  . Constipated    h/o  . COPD (chronic obstructive pulmonary disease) ([place])    History of multiple hospital admissions for exercabation   . COPD with exacerbation ([place]) 04/06/2009   Qualifier: Diagnosis of  By: Eyvonne Mechanic MD, Vijay    . Depression   . Diarrhea    h/o  . GERD (gastroesophageal reflux disease)   . Headache    "a few times/month" (07/22/2016)  . Heart murmur 10/05/11   "first time I ever heard I had one was today"  . Hyperlipidemia   . Hypertension   . Obesity   . Personal history of chemotherapy   . Personal history of radiation therapy   . Pneumonia   . Shortness of breath 10/05/11   "at rest; lying down; w/exertion"  . Sigmoid diverticulitis 80/2008  . Tobacco abuse   . Type II diabetes mellitus [facility])     Patient Active Problem List   Diagnosis  Date Noted  . Bruising 06/21/2018  . Weight loss 06/21/2018  . Itching 06/21/2018  . Atopic dermatitis 03/20/2018  . Black stools 09/19/2017  . Seasonal allergies 02/25/2017  . Right knee pain 05/23/2015  . COPD, frequent exacerbations ([place]) 12/24/2014  . QT prolongation 08/08/2014  . Dyslipidemia associated with type 2 diabetes mellitus ([place]) 06/14/2014  . History of breast cancer 12/01/2012  . Primary osteoarthritis of right knee 09/29/2012  . Depression with generalized anxiety 12/30/2009  . Type 2 diabetes mellitus ([place]) 05/14/2009  . TOBACCO ABUSE 04/06/2009  . Hypertension, benign essential, goal below 140/90 04/06/2009    Past Surgical History:  Procedure Laterality Date  . ABDOMINAL HYSTERECTOMY    . ANTERIOR CERVICAL DECOMP/DISCECTOMY FUSION  2012   "Dr. Lynann Bologna  put plate in; did something to my vertebrae"  . BACK SURGERY    . BREAST LUMPECTOMY Right 1991  . DOBUTAMINE STRESS ECHO  08/2004   Inferior ischemia, normal LV systolic function, no significant CAD    OB History   None      Home Medications    Prior to Admission medications   Medication Sig Start Date End Date Taking? Authorizing Provider  ACCU-CHEK FASTCLIX LANCETS MISC 1 each by Other route See admin instructions. Check blood sugar daily as needed for high blood sugar.  [provider]  acetaminophen (TYLENOL) 325 MG tablet Take 650 mg by mouth 2 (two) times daily as needed for headache.    [provider]  albuterol (PROVENTIL HFA;VENTOLIN HFA) 108 (90 Base) MCG/ACT inhaler Inhale 2 puffs into the lungs every 6 (six) hours as needed for wheezing or shortness of breath. 02/25/17   Asencion Partridge, MD  benzonatate (TESSALON) 200 MG capsule Take 1 capsule (200 mg total) by mouth 3 (three) times daily as needed for up to 7 days for cough. 07/24/18 07/31/18  Zayra Devito C, PA-C  betamethasone valerate ointment (VALISONE) 0.1 % Apply 1 application topically 2 (two) times daily. 04/27/18    Zada Finders, MD  Blood Glucose Monitoring Suppl (ACCU-CHEK NANO SMARTVIEW) W/DEVICE KIT 1 each by Other route See admin instructions. Check blood sugar daily as needed for high blood sugar.    [provider]  buPROPion (WELLBUTRIN XL) 300 MG 24 hr tablet Take 1 tablet (300 mg total) daily by mouth. 09/15/17   Rice, Resa Miner, MD  celecoxib (CELEBREX) 200 MG capsule Take 1 capsule (200 mg total) by mouth daily as needed. 06/28/18 07/28/18  Lars Mage, MD  diclofenac sodium (VOLTAREN) 1 % GEL Apply 2 g topically 2 (two) times daily as needed. 06/21/18   Santos-Sanchez, Merlene Morse, MD  doxycycline (VIBRAMYCIN) 100 MG capsule Take 1 capsule (100 mg total) by mouth 2 (two) times daily for 10 days. 07/24/18 08/03/18  Pang Robers C, PA-C  Fluticasone-Umeclidin-Vilant (TRELEGY ELLIPTA) 100-62.5-25 MCG/INH AEPB Inhale into the lungs.    [provider]  glucose blood (ACCU-CHEK SMARTVIEW) test strip Use to check blood sugar 1 to 2 times daily. diag code E 11.9. Non- insulin dependent 10/02/14   Sid Falcon, MD  hydrOXYzine (ATARAX/VISTARIL) 10 MG tablet Take 1 tablet (10 mg total) by mouth 3 (three) times daily as needed for itching or anxiety. 04/27/18   Zada Finders, MD  ipratropium-albuterol (DUONEB) 0.5-2.5 (3) MG/3ML SOLN Take 3 mLs by nebulization every 6 (six) hours as needed. 01/19/18   Welford Roche, MD  levocetirizine (XYZAL) 5 MG tablet Take 2 x a day for the itching 04/09/18   Raylene Everts, MD  losartan-hydrochlorothiazide [facility]) 100-25 MG tablet TAKE 1 TABLET BY MOUTH EVERY DAY 03/10/18   Welford Roche, MD  metFORMIN (GLUCOPHAGE) 1000 MG tablet TAKE 1 TABLET BY MOUTH TWICE DAILY 03/10/18   Welford Roche, MD  metoprolol tartrate (LOPRESSOR) 25 MG tablet Take 1 tablet (25 mg total) by mouth 2 (two) times daily. 12/28/17   Shela Leff, MD  predniSONE (DELTASONE) 50 MG tablet Take 1 tablet (50 mg total) by mouth daily for 3 days. 07/24/18  07/27/18  Megahn Killings C, PA-C  RA ASPIRIN EC ADULT LOW ST 81 MG EC tablet take 1 tablet by mouth once daily 01/04/17   Asencion Partridge, MD  silver sulfADIAZINE (SILVADENE) 1 % cream Apply 1 application topically daily. 03/16/18   Collier Salina, MD  simvastatin (ZOCOR) 40 MG tablet Take 1 tablet (40 mg total) by mouth daily at 6 PM. 03/16/18   Rice, Resa Miner, MD    Family History Family History  Problem Relation Age of Onset  . Cancer Mother     Social History Social History   Tobacco Use  . Smoking status: Current Some Day Smoker    Packs/day: 0.25    Years: 40.00    Pack years: 10.00    Types: Cigarettes    Last attempt to quit: 09/11/2014  Years since quitting: 3.8  . Smokeless tobacco: Never Used  Substance Use Topics  . Alcohol use: Yes    Comment: 07/22/2016 "can of beer maybe twice/month"  . Drug use: No     Allergies   Ace inhibitors and Flonase [fluticasone]   Review of Systems Review of Systems  Constitutional: Negative for activity change, appetite change, chills, fatigue and fever.  HENT: Positive for congestion, rhinorrhea and sore throat. Negative for ear pain, sinus pressure and trouble swallowing.   Eyes: Negative for discharge and redness.  Respiratory: Positive for cough, shortness of breath and wheezing. Negative for chest tightness.   Cardiovascular: Negative for chest pain.  Gastrointestinal: Negative for abdominal pain, diarrhea, nausea and vomiting.  Musculoskeletal: Negative for myalgias.  Skin: Negative for rash.  Neurological: Negative for dizziness, light-headedness and headaches.     Physical Exam Triage Vital Signs ED Triage Vitals [07/24/18 1022]  Enc Vitals Group     BP (!) 188/100     Pulse Rate 100     Resp (!) 22     Temp 99.1 F (37.3 C)     Temp src      SpO2 98 %     Weight      Height      Head Circumference      Peak Flow      Pain Score      Pain Loc      Pain Edu?      Excl. in [place]?    No data  found.  Updated Vital Signs BP (!) 188/100   Pulse 100   Temp 99.1 F (37.3 C)   Resp (!) 22   SpO2 98%   Visual Acuity Right Eye Distance:   Left Eye Distance:   Bilateral Distance:    Right Eye Near:   Left Eye Near:    Bilateral Near:     Physical Exam  Constitutional: She appears well-developed and well-nourished.  HENT:  Head: Normocephalic and atraumatic.  Bilateral ears without tenderness to palpation of external auricle, tragus and mastoid, EAC's without erythema or swelling, TM's with good bony landmarks and cone of light. Non erythematous.  Oral mucosa pink and moist, no tonsillar enlargement or exudate. Posterior pharynx patent and nonerythematous, no uvula deviation or swelling. Normal phonation.  Eyes: Conjunctivae are normal.  Neck: Neck supple.  Cardiovascular: Normal rate and regular rhythm.  No murmur heard. Pulmonary/Chest: Effort normal and breath sounds normal. No respiratory distress.  Appears slightly uncomfortable breathing, leaning forward, expiratory wheezing throughout bilateral lung fields; wheezing still present after breathing treatment.  Abdominal: Soft. There is no tenderness.  Musculoskeletal: She exhibits no edema.  Neurological: She is alert.  Skin: Skin is warm and dry.  Psychiatric: She has a normal mood and affect.  Nursing note and vitals reviewed.    UC Treatments / Results  Labs (all labs ordered are listed, but only abnormal results are displayed) Labs Reviewed - No data to display  EKG None  Radiology No results found.  Procedures Procedures (including critical care time)  Medications Ordered in UC Medications  ipratropium-albuterol (DUONEB) 0.5-2.5 (3) MG/3ML nebulizer solution 3 mL (3 mLs Nebulization Given 07/24/18 1030)  methylPREDNISolone sodium succinate (SOLU-MEDROL) 125 mg/2 mL injection 80 mg (80 mg Intramuscular Given 07/24/18 1109)  albuterol (PROVENTIL HFA;VENTOLIN HFA) 108 (90 Base) MCG/ACT inhaler 1 puff  (1 puff Inhalation Given 07/24/18 1108)  ipratropium-albuterol (DUONEB) 0.5-2.5 (3) MG/3ML nebulizer solution 3 mL (3 mLs Nebulization Given 07/24/18 1108)  Initial Impression / Assessment and Plan / UC Course  I have reviewed the triage vital signs and the nursing notes.  Pertinent labs & imaging results that were available during my care of the patient were reviewed by me and considered in my medical decision making (see chart for details).     Patient provided with 2 DuoNeb's, patient has history of QT prolongation, her most recent EKG had QT segment less than 460, previously tolerated these.  No immediate issues after using.  Will send home with albuterol inhaler.  Will provide Solu-Medrol IM to help with inflammation in lungs, doxycycline for sinusitis/COPD exacerbation urged to avoid QT prolongation.  Tessalon for cough.  O2 stable.  Discussed strict return precautions. Patient verbalized understanding and is agreeable with plan.  Final Clinical Impressions(s) / UC Diagnoses   Final diagnoses:  COPD exacerbation ([place])     Discharge Instructions     We gave you 2 breathing treatments today as well as a injection of Solu-Medrol  Please use the albuterol inhaler as needed for significant shortness of breath or wheezing Begin doxycycline twice daily for the next 7 days Please use Tessalon for cough  Please follow-up here in the emergency room if developing worsening difficulty breathing, shortness of breath or chest discomfort for persistent symptoms without improvement   ED Prescriptions    Medication Sig Dispense Auth. Provider   doxycycline (VIBRAMYCIN) 100 MG capsule Take 1 capsule (100 mg total) by mouth 2 (two) times daily for 10 days. 20 capsule Jenee Spaugh C, PA-C   benzonatate (TESSALON) 200 MG capsule Take 1 capsule (200 mg total) by mouth 3 (three) times daily as needed for up to 7 days for cough. 28 capsule Lamyia Cdebaca C, PA-C   predniSONE (DELTASONE) 50 MG  tablet Take 1 tablet (50 mg total) by mouth daily for 3 days. 3 tablet Haven Pylant C, PA-C     Controlled Substance Prescriptions [place] Controlled Substance Registry consulted? Not Applicable   Janith [place], [place] 07/24/18 2046

## 2018-07-24 NOTE — ED Triage Notes (Signed)
Triaged by provider  

## 2018-07-25 ENCOUNTER — Telehealth: Payer: Self-pay | Admitting: Dietician

## 2018-07-25 ENCOUNTER — Encounter: Payer: Self-pay | Admitting: Internal Medicine

## 2018-07-25 ENCOUNTER — Other Ambulatory Visit: Payer: Self-pay

## 2018-07-25 ENCOUNTER — Ambulatory Visit (INDEPENDENT_AMBULATORY_CARE_PROVIDER_SITE_OTHER): Payer: Medicare Other | Admitting: Internal Medicine

## 2018-07-25 ENCOUNTER — Encounter: Payer: Self-pay | Admitting: Dietician

## 2018-07-25 VITALS — BP 191/107 | HR 71 | Temp 98.2°F | Ht 66.0 in | Wt 195.7 lb

## 2018-07-25 DIAGNOSIS — L659 Nonscarring hair loss, unspecified: Secondary | ICD-10-CM | POA: Insufficient documentation

## 2018-07-25 DIAGNOSIS — I10 Essential (primary) hypertension: Secondary | ICD-10-CM

## 2018-07-25 DIAGNOSIS — R51 Headache: Secondary | ICD-10-CM

## 2018-07-25 DIAGNOSIS — M1990 Unspecified osteoarthritis, unspecified site: Secondary | ICD-10-CM

## 2018-07-25 DIAGNOSIS — J441 Chronic obstructive pulmonary disease with (acute) exacerbation: Secondary | ICD-10-CM

## 2018-07-25 DIAGNOSIS — F411 Generalized anxiety disorder: Secondary | ICD-10-CM | POA: Diagnosis not present

## 2018-07-25 DIAGNOSIS — Z720 Tobacco use: Secondary | ICD-10-CM

## 2018-07-25 DIAGNOSIS — J449 Chronic obstructive pulmonary disease, unspecified: Secondary | ICD-10-CM | POA: Diagnosis not present

## 2018-07-25 DIAGNOSIS — F418 Other specified anxiety disorders: Secondary | ICD-10-CM

## 2018-07-25 DIAGNOSIS — Z79899 Other long term (current) drug therapy: Secondary | ICD-10-CM | POA: Diagnosis not present

## 2018-07-25 MED ORDER — BUPROPION HCL ER (XL) 300 MG PO TB24: 300.0000 mg | ORAL_TABLET | Freq: Every day | ORAL | 2 refills | Status: DC

## 2018-07-25 MED ORDER — IPRATROPIUM-ALBUTEROL 0.5-2.5 (3) MG/3ML IN SOLN: 3.0000 mL | Freq: Four times a day (QID) | RESPIRATORY_TRACT | 3 refills | Status: DC | PRN

## 2018-07-25 MED ORDER — AZITHROMYCIN 250 MG PO TABS: ORAL_TABLET | ORAL | 0 refills | Status: DC

## 2018-07-25 NOTE — Assessment & Plan Note (Signed)
BP elevated today; patient asymptomatic and states she had not taken meds today yet.  Plan: --advised to take meds regularly --f/u with PCP in 1 month

## 2018-07-25 NOTE — Assessment & Plan Note (Signed)
>>  ASSESSMENT AND PLAN FOR CHRONIC OBSTRUCTIVE PULMONARY DISEASE WITH BRONCHOSPASM ([place]) WRITTEN ON 07/25/2018  2:03 PM BY Alphonzo Grieve, MD  Patient with a few days of dyspnea, increased cough with sputum production and wheezing, just seen at [facility] and treated for COPD exacerbation. On exam today she is breathing comfortably and has no wheezing or rales.   Plan: --refilled duonebs --changed doxycycline to azithromycin due to cost as patient had not yet picked up --continue prednisone as prescribed by UC --discussed smoking cessation, which she is not interested in at this time and has never attempted

## 2018-07-25 NOTE — Assessment & Plan Note (Signed)
>>  ASSESSMENT AND PLAN FOR GAD (GENERALIZED ANXIETY DISORDER) WRITTEN ON 07/25/2018  2:06 PM BY Alphonzo Grieve, MD  Patient with GAD score of 14 today and symptoms of anxiety for over 1 year which is consistent with moderate GAD. Due to refill history, it is unclear whether she had actually been taking wellbutrin consecutively, however she states a definite worsening of symptoms in the last two weeks since being out of wellbutrin.  Plan: --refill wellbutrin 300mg  daily --f/u with PCP in 1 month and reassess after consistent therapy whether dose change or change in regimen is warranted --patient not interested in counseling at this time

## 2018-07-25 NOTE — Assessment & Plan Note (Signed)
Patient with GAD score of 14 today and symptoms of anxiety for over 1 year which is consistent with moderate GAD. Due to refill history, it is unclear whether she had actually been taking wellbutrin consecutively, however she states a definite worsening of symptoms in the last two weeks since being out of wellbutrin.  Plan: --refill wellbutrin 300mg  daily --f/u with PCP in 1 month and reassess after consistent therapy whether dose change or change in regimen is warranted --patient not interested in counseling at this time

## 2018-07-25 NOTE — Progress Notes (Signed)
CC: COPD  HPI:  [name] is a 67 y.o. with a PMH of COPD, HTN, tobacco use, osteoarthritis presenting to clinic for COPD, anxiety, and head pain.  COPD: Patient reports a few days of shortness of breath, wheezing, and cough with white sputum production. She was seen at urgent care yesterday and was given 2 duoneb treatments, IM solumedrol and was given script for doxycycline and prednisone. She reports trigger might have been broken air conditioner; she also continues to smoke and has been having nasal congestion and rhinorrhea during this same period of worsening breathing symptoms. She states compliance with Trelegy; she doesn't think she has Duoneb treatments at home as she tried to self treat but didn't have same effect as when they were given in the urgent care. She feels her breathing is improved today but still not back to baseline. She has not yet picked up the prescriptions for doxycycline and prednisone.  GAD: Patient with h/o GAD; she states that overall compared to last year her anxiety is better controlled. She states she has been taking wellbutrin 300mg  daily but ran out 2 weeks ago (58mo script given in Nov 2018). She feels she likely had better symptom control while she was on wellbutrin and would like a refill.   Head pain: Patient reports several year history of head tenderness with hair thinning at the top of her head; tenderness waxes and wanes and is not always improved with nsaids. It is exacerbated by combing her hair.   HTN: Patient on metoprolol 25mg  BID and losartan-hctz 100-25mg  daily, but states she has not taken today yet. She endorses chronic unchanged intermittent headaches which are bandlike and not associated with chest pain, nausea, vomiting, focal weakness, numbness, vision or hearing changes.   Please see problem based Assessment and Plan for status of patients chronic conditions.  Past Medical History:  Diagnosis Date  . Anxiety   . Asthma     . Bronchitis    h/o  . Cancer of right breast ([place]) 1991   s/p lumpectomy, chemotherapy and radiation therapy in 1991. Mammogram in 2007 was normal.  . Constipated    h/o  . COPD (chronic obstructive pulmonary disease) ([place])    History of multiple hospital admissions for exercabation   . COPD with exacerbation ([place]) 04/06/2009   Qualifier: Diagnosis of  By: Eyvonne Mechanic MD, Vijay    . Depression   . Diarrhea    h/o  . GERD (gastroesophageal reflux disease)   . Headache    "a few times/month" (07/22/2016)  . Heart murmur 10/05/11   "first time I ever heard I had one was today"  . Hyperlipidemia   . Hypertension   . Obesity   . Personal history of chemotherapy   . Personal history of radiation therapy   . Pneumonia   . Shortness of breath 10/05/11   "at rest; lying down; w/exertion"  . Sigmoid diverticulitis 80/2008  . Tobacco abuse   . Type II diabetes mellitus ([place])     Review of Systems:   Per HPI  Physical Exam:  Vitals:   07/25/18 0841 07/25/18 0936  BP: (!) 184/100 (!) 191/107  Pulse: 96 71  Temp: 98.2 F (36.8 C)   TempSrc: Oral   SpO2: 100%   Weight: 195 lb 11.2 oz (88.8 kg)   Height: 5\' 6"  (1.676 m)    GENERAL- alert, co-operative, appears as stated age, not in any distress. HEENT- mild TTP at vertex of head with associated  decreased hair follicle density; no scalp lesions noted. No cervical and supraclavicular lymphadenopathy CARDIAC- RRR, no murmurs, rubs or gallops. RESP- Moving equal volumes of air, and clear to auscultation bilaterally, no wheezes or crackles. NEURO- CN 2-12 grossly intact. Moves all 4 extremities freely. SKIN- Warm, dry, no rash or lesion. PSYCH- at times tangential speech; appears mildly anxious  Assessment & Plan:   See Encounters Tab for problem based charting.   Patient discussed with Dr. Moshe Cipro, MD Internal Medicine PGY-3

## 2018-07-25 NOTE — Assessment & Plan Note (Signed)
Patient with a few days of dyspnea, increased cough with sputum production and wheezing, just seen at [facility] and treated for COPD exacerbation. On exam today she is breathing comfortably and has no wheezing or rales.   Plan: --refilled duonebs --changed doxycycline to azithromycin due to cost as patient had not yet picked up --continue prednisone as prescribed by UC --discussed smoking cessation, which she is not interested in at this time and has never attempted

## 2018-07-25 NOTE — Telephone Encounter (Signed)
Steroid-Induced Hyperglycemia Prevention and Management [name] is a 68 y.o. female who meets criteria for [facility] glucose monitoring program (diabetes patient prescribed short course of steroids).  A/P Current Regimen  Patient prescribed prednisone 50 mg daily x 3 days, currently on day 0 of therapy. Patient taking prednisone in the ?  Prednisone indication: COPD  Current DM regimen metformin   Home BG Monitoring  Patient does have a meter at home and does not check BG at home. Meter was not supplied.  CBGs at home 114  CBGs prior to steroid course unknown, A1C prior to steroid course 5.4  Medication Management  Switch prednisone dose to AM not applicable  Physician preference level per protocol: 1  Patient Education  Advised patient to monitor BG while on steroid therapy (at least twice daily prior to first 2 meals of the day).  Patient did not verbalize understanding of information and regimen by repeating back topics discussed. I lfet a voicemail with information.   Follow-up 1 month  09/01/18  [name], RD 07/25/2018 3:52 PM.   [name] 3:46 PM 07/25/2018

## 2018-07-25 NOTE — Assessment & Plan Note (Signed)
Patient with multiple year h/o of hair thinning and scalp tenderness on vertex. Will refer to dermatology as may warrant biopsy as tenderness suggests inflammatory process (however no visible scarring on exam today).  Plan: --refer to dermatology

## 2018-07-25 NOTE — Patient Instructions (Signed)
For your breathing, I changed the antibiotic to azithromycin as it is cheaper and still a great medication for this. Take and prednisone as previously advised by the urgent care doctor.  I have refilled the duoneb nebulizer solution.  I have refilled your wellbutrin.

## 2018-07-26 NOTE — Progress Notes (Signed)
Internal Medicine Clinic Attending  Case discussed with Dr. Svalina at the time of the visit.  We reviewed the resident's history and exam and pertinent patient test results.  I agree with the assessment, diagnosis, and plan of care documented in the resident's note.  Alexander Raines, M.D., Ph.D.  

## 2018-07-26 NOTE — Telephone Encounter (Signed)
Started steroids Yesterday morning. She has a meter and strips but doesn't check her blood sugars very often. She verbalized understanding to check two time today and tomorrow.  Plan follow up call tomorrow afternoon. Debera Lat, RD 07/26/2018 5:10 PM.

## 2018-07-27 ENCOUNTER — Encounter: Payer: Self-pay | Admitting: Dietician

## 2018-07-27 NOTE — Telephone Encounter (Signed)
Did not check blood sugars. Slept all day. Took last dose of prednisone this morning.  Agrees to check blood sugars and have them tomorrow afternoon.  Plan is to follow up by telephone early tomorrow afternoon. Debera Lat, RD 07/27/2018 5:20 PM.

## 2018-07-28 NOTE — Telephone Encounter (Signed)
I did not get an answer when calling today. I left a message for [name] that if her blood sugars are more than 200 yesterday or today that she should decreases the starches and sugars she is eating and drink water an no sugar drinks and continue to check her blood sugar once daily until her blood sugar is back to normal. Call with questions

## 2018-08-08 ENCOUNTER — Emergency Department (HOSPITAL_COMMUNITY)
Admission: EM | Admit: 2018-08-08 | Discharge: 2018-08-08 | Disposition: A | Discharge status: Home / Self Care | Payer: Medicare Other | Attending: Emergency Medicine | Admitting: Emergency Medicine

## 2018-08-08 ENCOUNTER — Other Ambulatory Visit: Payer: Self-pay

## 2018-08-08 ENCOUNTER — Encounter (HOSPITAL_COMMUNITY): Payer: Self-pay | Admitting: Emergency Medicine

## 2018-08-08 ENCOUNTER — Emergency Department (HOSPITAL_COMMUNITY): Payer: Medicare Other

## 2018-08-08 DIAGNOSIS — E119 Type 2 diabetes mellitus without complications: Secondary | ICD-10-CM | POA: Diagnosis not present

## 2018-08-08 DIAGNOSIS — Z853 Personal history of malignant neoplasm of breast: Secondary | ICD-10-CM | POA: Diagnosis not present

## 2018-08-08 DIAGNOSIS — J449 Chronic obstructive pulmonary disease, unspecified: Secondary | ICD-10-CM | POA: Insufficient documentation

## 2018-08-08 DIAGNOSIS — F17210 Nicotine dependence, cigarettes, uncomplicated: Secondary | ICD-10-CM | POA: Insufficient documentation

## 2018-08-08 DIAGNOSIS — F418 Other specified anxiety disorders: Secondary | ICD-10-CM

## 2018-08-08 DIAGNOSIS — E785 Hyperlipidemia, unspecified: Secondary | ICD-10-CM | POA: Insufficient documentation

## 2018-08-08 DIAGNOSIS — R0602 Shortness of breath: Secondary | ICD-10-CM | POA: Insufficient documentation

## 2018-08-08 DIAGNOSIS — F419 Anxiety disorder, unspecified: Secondary | ICD-10-CM | POA: Diagnosis not present

## 2018-08-08 DIAGNOSIS — I10 Essential (primary) hypertension: Secondary | ICD-10-CM | POA: Insufficient documentation

## 2018-08-08 LAB — CBC WITH DIFFERENTIAL/PLATELET
ABS IMMATURE GRANULOCYTES: 0 10*3/uL (ref 0.0–0.1)
Basophils Absolute: 0 10*3/uL (ref 0.0–0.1)
Basophils Relative: 0 %
Eosinophils Absolute: 0.4 10*3/uL (ref 0.0–0.7)
Eosinophils Relative: 5 %
HEMATOCRIT: 44.2 % (ref 36.0–46.0)
Hemoglobin: 14.7 g/dL (ref 12.0–15.0)
IMMATURE GRANULOCYTES: 0 %
LYMPHS ABS: 1.9 10*3/uL (ref 0.7–4.0)
Lymphocytes Relative: 27 %
MCH: 30.8 pg (ref 26.0–34.0)
MCHC: 33.3 g/dL (ref 30.0–36.0)
MCV: 92.5 fL (ref 78.0–100.0)
MONO ABS: 0.4 10*3/uL (ref 0.1–1.0)
MONOS PCT: 6 %
NEUTROS ABS: 4.3 10*3/uL (ref 1.7–7.7)
Neutrophils Relative %: 62 %
Platelets: 296 10*3/uL (ref 150–400)
RBC: 4.78 MIL/uL (ref 3.87–5.11)
RDW: 12.4 % (ref 11.5–15.5)
WBC: 7.1 10*3/uL (ref 4.0–10.5)

## 2018-08-08 LAB — BRAIN NATRIURETIC PEPTIDE: B Natriuretic Peptide: 18.2 pg/mL (ref 0.0–100.0)

## 2018-08-08 LAB — BASIC METABOLIC PANEL
Anion gap: 13 (ref 5–15)
BUN: 5 mg/dL — ABNORMAL LOW (ref 8–23)
CALCIUM: 9.9 mg/dL (ref 8.9–10.3)
CO2: 25 mmol/L (ref 22–32)
CREATININE: 0.82 mg/dL (ref 0.44–1.00)
Chloride: 103 mmol/L (ref 98–111)
GFR calc Af Amer: >60 mL/min (ref >60)
GFR calc non Af Amer: >60 mL/min (ref >60)
GLUCOSE: 101 mg/dL — AB (ref 70–99)
Potassium: 3.6 mmol/L (ref 3.5–5.1)
Sodium: 141 mmol/L (ref 135–145)

## 2018-08-08 LAB — D-DIMER, QUANTITATIVE: D-Dimer, Quant: 0.27 ug/mL-FEU (ref 0.00–0.50)

## 2018-08-08 LAB — I-STAT TROPONIN, ED: TROPONIN I, POC: 0 ng/mL (ref 0.00–0.08)

## 2018-08-08 MED ORDER — BUPROPION HCL ER (XL) 300 MG PO TB24: 300.0000 mg | ORAL_TABLET | Freq: Every day | ORAL | 0 refills | Status: DC

## 2018-08-08 MED ORDER — OXYMETAZOLINE HCL 0.05 % NA SOLN
1.0000 | Freq: Once | NASAL | Status: AC
  Administered 2018-08-08: 1 via NASAL
  Filled 2018-08-08: qty 15

## 2018-08-08 MED ORDER — CETIRIZINE HCL 10 MG PO TABS: 10.0000 mg | ORAL_TABLET | Freq: Every day | ORAL | 0 refills | Status: DC

## 2018-08-08 MED ORDER — IPRATROPIUM-ALBUTEROL 0.5-2.5 (3) MG/3ML IN SOLN
3.0000 mL | Freq: Once | RESPIRATORY_TRACT | Status: AC
  Administered 2018-08-08: 3 mL via RESPIRATORY_TRACT
  Filled 2018-08-08: qty 3

## 2018-08-08 MED ORDER — BENZONATATE 100 MG PO CAPS: 100.0000 mg | ORAL_CAPSULE | Freq: Three times a day (TID) | ORAL | 0 refills | Status: DC

## 2018-08-08 MED ORDER — LORAZEPAM 1 MG PO TABS
1.0000 mg | ORAL_TABLET | Freq: Once | ORAL | Status: AC
  Administered 2018-08-08: 1 mg via ORAL
  Filled 2018-08-08: qty 1

## 2018-08-08 MED ORDER — GUAIFENESIN ER 1200 MG PO TB12: 1.0000 | ORAL_TABLET | Freq: Two times a day (BID) | ORAL | 1 refills | Status: DC | PRN

## 2018-08-08 NOTE — Discharge Instructions (Signed)
Your lab work and imaging was reassuring today.  I have refilled your wellbutrin.  Please take mucinex and zyrtec as prescribed.  Follow up with your primary care doctor before the end of the week if symptoms continue or within the next 1 week for follow up.  If you develop worsening or new concerning symptoms you can return to the emergency department for re-evaluation.

## 2018-08-08 NOTE — ED Triage Notes (Signed)
Patient states she had a rough night last night due to her nasal congestion causing her to have a panic attack. Pt states she was given prednisone bu UC about 2 weeks ago. Pt states she is out of her anxiety medication.

## 2018-08-08 NOTE — ED Provider Notes (Signed)
[facility] EMERGENCY DEPARTMENT Provider Note   CSN: [id number] Arrival date & time: 08/08/18  9485     History   Chief Complaint Chief Complaint  Patient presents with  . Nasal Congestion  . Anxiety    HPI [name] is a 67 y.o. female with remote history of breast cancer status post lumpectomy, chemotherapy and radiation, with a history of DM, hypertension, hyperlipidemia, COPD, tobacco abuse and anxiety who presents emergency department today for shortness of breath. Patient reports that she has had ongoing shortness of breath for the last 2 weeks. She notes that she was seen at urgent care for this on 07/24/18. On chart review patient was treated with doxycycline for sinusitis/COPD exacerbation, prednisone, Tessalon and albuterol inhaler.  She notes she is taking all of her medications to completion but notes although her symptoms did improve initially they have returned to baseline.  She notes that she has been using her albuterol inhaler for symptoms without any relief.  She reports that her shortness of breath occurs at rest and also with mild exertion.  No orthopnea or paroxysmal nocturnal dyspnea.  She reports that she does have a productive cough that occurs especially when she leans backwards.  No hemoptysis.  She denies any chest pain.  She reports yesterday she had a panic attack at home when she was having difficulty breathing, chest tightness and felt like her nose was stopped up.  She describes this as her heart racing and hyperventilating. No associated chest pain, diaphoresis, N/V. She reports she is out of her anxiety medications including Wellbutrin. She denies any fever, chills, lower extremity swelling, chest pain, recent surgery/travel, immobilization, history of blood clot.  No history of CHF. She is not on an ace-I that would be contributing to her ongoing cough.   HPI  Past Medical History:  Diagnosis Date  . Anxiety   . Asthma   .  Bronchitis    h/o  . Cancer of right breast ([place]) 1991   s/p lumpectomy, chemotherapy and radiation therapy in 1991. Mammogram in 2007 was normal.  . Constipated    h/o  . COPD (chronic obstructive pulmonary disease) ([place])    History of multiple hospital admissions for exercabation   . COPD with exacerbation ([place]) 04/06/2009   Qualifier: Diagnosis of  By: Eyvonne Mechanic MD, Vijay    . Depression   . Diarrhea    h/o  . GERD (gastroesophageal reflux disease)   . Headache    "a few times/month" (07/22/2016)  . Heart murmur 10/05/11   "first time I ever heard I had one was today"  . Hyperlipidemia   . Hypertension   . Obesity   . Personal history of chemotherapy   . Personal history of radiation therapy   . Pneumonia   . Shortness of breath 10/05/11   "at rest; lying down; w/exertion"  . Sigmoid diverticulitis 80/2008  . Tobacco abuse   . Type II diabetes mellitus [facility])     Patient Active Problem List   Diagnosis Date Noted  . Alopecia of scalp 07/25/2018  . Bruising 06/21/2018  . Weight loss 06/21/2018  . Itching 06/21/2018  . Atopic dermatitis 03/20/2018  . Black stools 09/19/2017  . Seasonal allergies 02/25/2017  . Right knee pain 05/23/2015  . COPD, frequent exacerbations ([place]) 12/24/2014  . QT prolongation 08/08/2014  . Dyslipidemia associated with type 2 diabetes mellitus ([place]) 06/14/2014  . History of breast cancer 12/01/2012  . Primary osteoarthritis of right knee  09/29/2012  . GAD (generalized anxiety disorder) 12/30/2009  . Type 2 diabetes mellitus ([place]) 05/14/2009  . TOBACCO ABUSE 04/06/2009  . Hypertension, benign essential, goal below 140/90 04/06/2009    Past Surgical History:  Procedure Laterality Date  . ABDOMINAL HYSTERECTOMY    . ANTERIOR CERVICAL DECOMP/DISCECTOMY FUSION  2012   "Dr. Lynann Bologna  put plate in; did something to my vertebrae"  . BACK SURGERY    . BREAST LUMPECTOMY Right 1991  . DOBUTAMINE STRESS ECHO  08/2004   Inferior ischemia, normal LV  systolic function, no significant CAD     OB History   None      Home Medications    Prior to Admission medications   Medication Sig Start Date End Date Taking? Authorizing Provider  ACCU-CHEK FASTCLIX LANCETS MISC 1 each by Other route See admin instructions. Check blood sugar daily as needed for high blood sugar.    [provider]  acetaminophen (TYLENOL) 325 MG tablet Take 650 mg by mouth 2 (two) times daily as needed for headache.    [provider]  albuterol (PROVENTIL HFA;VENTOLIN HFA) 108 (90 Base) MCG/ACT inhaler Inhale 2 puffs into the lungs every 6 (six) hours as needed for wheezing or shortness of breath. 02/25/17   Asencion Partridge, MD  azithromycin (ZITHROMAX) 250 MG tablet Take 2 tablets today, then 1 tablet daily starting tomorrow for 4 days. 07/25/18   Alphonzo Grieve, MD  betamethasone valerate ointment (VALISONE) 0.1 % Apply 1 application topically 2 (two) times daily. 04/27/18   Lenore Cordia, MD  Blood Glucose Monitoring Suppl (ACCU-CHEK NANO SMARTVIEW) W/DEVICE KIT 1 each by Other route See admin instructions. Check blood sugar daily as needed for high blood sugar.    [provider]  buPROPion (WELLBUTRIN XL) 300 MG 24 hr tablet Take 1 tablet (300 mg total) by mouth daily. 07/25/18   Alphonzo Grieve, MD  diclofenac sodium (VOLTAREN) 1 % GEL Apply 2 g topically 2 (two) times daily as needed. 06/21/18   Welford Roche, MD  Fluticasone-Umeclidin-Vilant (TRELEGY ELLIPTA) 100-62.5-25 MCG/INH AEPB Inhale into the lungs.    [provider]  glucose blood (ACCU-CHEK SMARTVIEW) test strip Use to check blood sugar 1 to 2 times daily. diag code E 11.9. Non- insulin dependent 10/02/14   Sid Falcon, MD  hydrOXYzine (ATARAX/VISTARIL) 10 MG tablet Take 1 tablet (10 mg total) by mouth 3 (three) times daily as needed for itching or anxiety. 04/27/18   Lenore Cordia, MD  ipratropium-albuterol (DUONEB) 0.5-2.5 (3) MG/3ML SOLN Take 3 mLs by  nebulization every 6 (six) hours as needed. 07/25/18   Alphonzo Grieve, MD  levocetirizine (XYZAL) 5 MG tablet Take 2 x a day for the itching 04/09/18   Raylene Everts, MD  losartan-hydrochlorothiazide [facility]) 100-25 MG tablet TAKE 1 TABLET BY MOUTH EVERY DAY 03/10/18   Welford Roche, MD  metFORMIN (GLUCOPHAGE) 1000 MG tablet TAKE 1 TABLET BY MOUTH TWICE DAILY 03/10/18   Welford Roche, MD  metoprolol tartrate (LOPRESSOR) 25 MG tablet Take 1 tablet (25 mg total) by mouth 2 (two) times daily. 12/28/17   Shela Leff, MD  RA ASPIRIN EC ADULT LOW ST 81 MG EC tablet take 1 tablet by mouth once daily 01/04/17   Asencion Partridge, MD  silver sulfADIAZINE (SILVADENE) 1 % cream Apply 1 application topically daily. 03/16/18   Collier Salina, MD  simvastatin (ZOCOR) 40 MG tablet Take 1 tablet (40 mg total) by mouth daily at 6 PM. 03/16/18  Collier Salina, MD    Family History Family History  Problem Relation Age of Onset  . Cancer Mother     Social History Social History   Tobacco Use  . Smoking status: Current Some Day Smoker    Packs/day: 0.50    Years: 40.00    Pack years: 20.00    Types: Cigarettes    Last attempt to quit: 09/11/2014    Years since quitting: 3.9  . Smokeless tobacco: Never Used  Substance Use Topics  . Alcohol use: Yes    Comment: 07/22/2016 "can of beer maybe twice/month"  . Drug use: No     Allergies   Ace inhibitors and Flonase [fluticasone]   Review of Systems Review of Systems  All other systems reviewed and are negative.    Physical Exam Updated Vital Signs BP (!) 174/102 (BP Location: Right Arm)   Pulse 95   Temp 98.7 F (37.1 C) (Oral)   Resp 19   Ht 5' 6" (1.676 m)   Wt 88 kg   SpO2 100%   BMI 31.31 kg/m   Physical Exam  Constitutional: She appears well-developed and well-nourished.  Appears anxious  HENT:  Head: Normocephalic and atraumatic.  Right Ear: Tympanic membrane and external ear normal.  Left Ear:  Tympanic membrane and external ear normal.  Nose: Mucosal edema present. Right sinus exhibits no maxillary sinus tenderness and no frontal sinus tenderness. Left sinus exhibits no maxillary sinus tenderness and no frontal sinus tenderness.  Mouth/Throat: Uvula is midline, oropharynx is clear and moist and mucous membranes are normal. No tonsillar exudate.  The patient has normal phonation and is in control of secretions. No stridor.  Midline uvula without edema. Soft palate rises symmetrically.  No tonsillar erythema or exudates. No PTA. Tongue protrusion is normal. No trismus. No creptius on neck palpation and patient has good dentition. No gingival erythema or fluctuance noted. Mucus membranes moist.   Eyes: Pupils are equal, round, and reactive to light. Right eye exhibits no discharge. Left eye exhibits no discharge. No scleral icterus.  Neck: Trachea normal. Neck supple. No spinous process tenderness present. No neck rigidity. Normal range of motion present.  No nuchal rigidity or meningismus  Cardiovascular: Normal rate, regular rhythm and intact distal pulses.  No murmur heard. Pulses:      Radial pulses are 2+ on the right side, and 2+ on the left side.       Dorsalis pedis pulses are 2+ on the right side, and 2+ on the left side.       Posterior tibial pulses are 2+ on the right side, and 2+ on the left side.  No lower extremity swelling or edema. Calves symmetric in size bilaterally.  Pulmonary/Chest: Effort normal. No accessory muscle usage. She exhibits no tenderness.  No increased work of breathing. No accessory muscle use. Patient is sitting upright, speaking in full sentences without difficulty   Abdominal: Soft. Bowel sounds are normal. There is no tenderness. There is no rebound and no guarding.  Musculoskeletal: She exhibits no edema.  Lymphadenopathy:    She has no cervical adenopathy.  Neurological: She is alert.  Skin: Skin is warm and dry. No rash noted. She is not  diaphoretic.  Psychiatric: She has a normal mood and affect.  Nursing note and vitals reviewed.    ED Treatments / Results  Labs (all labs ordered are listed, but only abnormal results are displayed) Labs Reviewed  BASIC METABOLIC PANEL - Abnormal; Notable for the  following components:      Result Value   Glucose, Bld 101 (*)    BUN 5 (*)    All other components within normal limits  CBC WITH DIFFERENTIAL/PLATELET  D-DIMER, QUANTITATIVE (NOT AT [facility])  BRAIN NATRIURETIC PEPTIDE  I-STAT TROPONIN, ED    EKG EKG Interpretation  Date/Time:  Tuesday August 08 2018 10:12:03 EDT Ventricular Rate:  101 PR Interval:    QRS Duration: 85 QT Interval:  351 QTC Calculation: 444 R Axis:   80 Text Interpretation:  Sinus tachycardia Multiform ventricular premature complexes Biatrial enlargement Probable anteroseptal infarct, old Confirmed by Gerlene Fee [id number]) on 08/08/2018 12:21:07 PM   Radiology Dg Chest 2 View  Result Date: 08/08/2018 CLINICAL DATA:  Congestion shortness of breath since last night. EXAM: CHEST - 2 VIEW COMPARISON:  PA and lateral chest 07/22/2016. FINDINGS: The chest is hyperexpanded with attenuation of the pulmonary vasculature. Lungs are clear. Heart size is normal. No acute or focal bony abnormality is identified. Surgical clips right axilla are noted. IMPRESSION: COPD without acute disease. Electronically Signed   By: Inge Rise M.D.   On: 08/08/2018 11:44    Procedures Procedures (including critical care time)  Medications Ordered in ED Medications  ipratropium-albuterol (DUONEB) 0.5-2.5 (3) MG/3ML nebulizer solution 3 mL (3 mLs Nebulization Given 08/08/18 1115)  LORazepam (ATIVAN) tablet 1 mg (1 mg Oral Given 08/08/18 1115)  oxymetazoline (AFRIN) 0.05 % nasal spray 1 spray (1 spray Each Nare Given 08/08/18 1215)     Initial Impression / Assessment and Plan / ED Course  I have reviewed the triage vital signs and the nursing notes.  Pertinent labs &  imaging results that were available during my care of the patient were reviewed by me and considered in my medical decision making (see chart for details).     67 y.o. female presenting to the emergent department today with 2 weeks of shortness of breath as well as anxiety attack that occurred yesterday.  Please see further details above.  Patient denies any SI/HI.  Vital signs are reassuring as above.  Mild tachycardia for which a d-dimer was ordered secondary to unexplained shortness of breath.  Patient is without any hypoxia.  She denies any chest pain.  EKG and troponin are reassuring.  Do not suspect ACS.  D-dimer within normal limits.  Do not suspect PE.  Lab work is overall reassuring.  No leukocytosis.  BNP within normal limits.  Otherwise baseline labs.  Chest x-ray without active disease.  Patient reports her symptoms have resolved.  She has been without hypoxia in the department.  Will discharge home on symptomatic care.  Patient does report continued nasal congestion as well as rhinorrhea and sneezing.  She is recently been treated with doxycycline for a sinus infection.  No sinus pressure, fever, leukocytosis.  Do not feel she will need a CT of the sinuses at this time.  Will discharge home on symptomatic therapy. I advised the patient to follow-up with pcp this week. Specific return precautions discussed. Time was given for all questions to be answered. The patient verbalized understanding and agreement with plan. The patient appears safe for discharge home. Patient case seen and discussed with Dr. Sedonia Small who is in agreement with plan.   Final Clinical Impressions(s) / ED Diagnoses   Final diagnoses:  Shortness of breath  Anxiety    ED Discharge Orders         Ordered    buPROPion (WELLBUTRIN XL) 300 MG 24 hr tablet  Daily     08/08/18 1413    Guaifenesin (MUCINEX MAXIMUM STRENGTH) 1200 MG TB12  Every 12 hours PRN     08/08/18 1413    cetirizine (ZYRTEC) 10 MG tablet  Daily      08/08/18 1413           Jillyn Ledger, PA-C 08/08/18 1422    Maudie Flakes, MD 08/08/18 1537

## 2018-08-09 ENCOUNTER — Ambulatory Visit (INDEPENDENT_AMBULATORY_CARE_PROVIDER_SITE_OTHER): Payer: Medicare Other | Admitting: Internal Medicine

## 2018-08-09 ENCOUNTER — Other Ambulatory Visit: Payer: Self-pay

## 2018-08-09 ENCOUNTER — Encounter: Payer: Self-pay | Admitting: Internal Medicine

## 2018-08-09 VITALS — BP 152/89 | HR 89 | Temp 99.0°F | Ht 66.0 in | Wt 190.8 lb

## 2018-08-09 DIAGNOSIS — Z79899 Other long term (current) drug therapy: Secondary | ICD-10-CM

## 2018-08-09 DIAGNOSIS — J441 Chronic obstructive pulmonary disease with (acute) exacerbation: Secondary | ICD-10-CM | POA: Diagnosis not present

## 2018-08-09 DIAGNOSIS — R918 Other nonspecific abnormal finding of lung field: Secondary | ICD-10-CM | POA: Diagnosis not present

## 2018-08-09 DIAGNOSIS — Z792 Long term (current) use of antibiotics: Secondary | ICD-10-CM

## 2018-08-09 DIAGNOSIS — Z23 Encounter for immunization: Secondary | ICD-10-CM | POA: Diagnosis not present

## 2018-08-09 DIAGNOSIS — F17210 Nicotine dependence, cigarettes, uncomplicated: Secondary | ICD-10-CM | POA: Diagnosis not present

## 2018-08-09 DIAGNOSIS — J189 Pneumonia, unspecified organism: Secondary | ICD-10-CM

## 2018-08-09 DIAGNOSIS — Z7952 Long term (current) use of systemic steroids: Secondary | ICD-10-CM | POA: Diagnosis not present

## 2018-08-09 MED ORDER — AZITHROMYCIN 1 G PO PACK: 1.0000 g | PACK | Freq: Once | ORAL | 0 refills | Status: AC

## 2018-08-09 MED ORDER — PREDNISONE 20 MG PO TABS: 40.0000 mg | ORAL_TABLET | Freq: Every day | ORAL | 0 refills | Status: AC

## 2018-08-09 MED ORDER — PSEUDOEPHEDRINE HCL ER 120 MG PO TB12: 120.0000 mg | ORAL_TABLET | Freq: Two times a day (BID) | ORAL | 0 refills | Status: DC | PRN

## 2018-08-09 MED ORDER — CIPROFLOXACIN HCL 500 MG PO TABS: 500.0000 mg | ORAL_TABLET | Freq: Two times a day (BID) | ORAL | 0 refills | Status: AC

## 2018-08-09 NOTE — Patient Instructions (Addendum)
Ms. Canal,   Thank you for coming to the clinic today. It was a pleasure to see you.   For your COPD Exacerbation, please take:  Two antibiotics: Z-pack 500mg  today and then 250 mg one table once per day for four days Ciprofloxacin 500mg  by mouth once per day for five days, including today  Prednisone 40 mg (two tablets) once per day by mouth  Pseudoephedrine 120 mg, one tablet, every 12 hours as needed for sinus congestion.   If your symptoms do not improve or worsen or if you develop fever over the next two days please do not hesitate to call our clinic or go to the emergency department.   Please follow-up

## 2018-08-09 NOTE — Assessment & Plan Note (Signed)
>>  ASSESSMENT AND PLAN FOR CHRONIC OBSTRUCTIVE PULMONARY DISEASE WITH BRONCHOSPASM ([place]) WRITTEN ON 08/09/2018  1:09 PM BY SEAWELL, JAIMIE A, DO  Increased sputum, cough, chills and SOB for the for the past three weeks that has not resolved after seeing urgent care three weeks ago with doxycycline and prednisone. She went to the ED yesterday and cxr shows LLL opacity. As her symptoms have not resolved with doxy and have been ongoing for three weeks, we will start stronger antibiotics and repeat steroids.   - prednisone 40 mg qd 5 days  - Z-pack - cipro 500 mg 5 days  - sudafed prn for sinus congestion

## 2018-08-09 NOTE — Assessment & Plan Note (Signed)
Increased sputum, cough, chills and SOB for the for the past three weeks that has not resolved after seeing urgent care three weeks ago with doxycycline and prednisone. She went to the ED yesterday and cxr shows LLL opacity. As her symptoms have not resolved with doxy and have been ongoing for three weeks, we will start stronger antibiotics and repeat steroids.   - prednisone 40 mg qd 5 days  - Z-pack - cipro 500 mg 5 days  - sudafed prn for sinus congestion

## 2018-08-09 NOTE — Progress Notes (Signed)
   CC: cough  HPI:  [name] is a 67 y.o. with PMH as below. She states she she has had cough with yellow sputum,  and SOB for the past three weeks. She denies fever but has had chills at night. She has sinus congestion that is making it difficult to sleep. Two weeks ago she went to urgent care and was given 4 days prednisone & doxy which did not help. Yesterday she went to the ER as her symptoms had not resolved. She was given duonebs, which did not help. She has a history of recurrent COPD exacerbations but has not had one in about a year.  Please see A&P for assessment of the patient's chronic medical conditions.     Past Medical History:  Diagnosis Date  . Anxiety   . Asthma   . Bronchitis    h/o  . Cancer of right breast ([place]) 1991   s/p lumpectomy, chemotherapy and radiation therapy in 1991. Mammogram in 2007 was normal.  . Constipated    h/o  . COPD (chronic obstructive pulmonary disease) ([place])    History of multiple hospital admissions for exercabation   . COPD with exacerbation ([place]) 04/06/2009   Qualifier: Diagnosis of  By: Eyvonne Mechanic MD, Vijay    . Depression   . Diarrhea    h/o  . GERD (gastroesophageal reflux disease)   . Headache    "a few times/month" (07/22/2016)  . Heart murmur 10/05/11   "first time I ever heard I had one was today"  . Hyperlipidemia   . Hypertension   . Obesity   . Personal history of chemotherapy   . Personal history of radiation therapy   . Pneumonia   . Shortness of breath 10/05/11   "at rest; lying down; w/exertion"  . Sigmoid diverticulitis 80/2008  . Tobacco abuse   . Type II diabetes mellitus ([place])    Review of Systems:    Review of Systems  Constitutional: Positive for chills. Negative for fever and weight loss.  HENT: Positive for congestion and sinus pain. Negative for ear discharge, ear pain, nosebleeds and sore throat.   Respiratory: Positive for cough, sputum production, shortness of breath and wheezing.  Negative for hemoptysis.   Cardiovascular: Negative for chest pain, palpitations and leg swelling.  Gastrointestinal: Negative for abdominal pain, constipation, diarrhea, nausea and vomiting.  Neurological: Negative for dizziness and headaches.    Physical Exam:  Constitution: NAD, cooperative HENT: sinuses NTTP, no bulging TM, nares normal Eyes: no scleral icterus, EOM intact Cardio: RRR, no m/r/g Respiratory: +wheezing bilaterally, no rales, rhonchi Abdominal: NTTP, soft, nondistended MSK: no edema, +pulses Neuro: A&Ox3, normal affect Skin: c/d/i    Vitals:   08/09/18 0852  BP: (!) 152/89  Pulse: 89  Temp: 99 F (37.2 C)  TempSrc: Oral  SpO2: 100%  Weight: 190 lb 12.8 oz (86.5 kg)  Height: 5\' 6"  (1.676 m)   CXR 10/1: CXR reviewed and my interpretation includes hyperinflated lungs, left lower lobe opacity   Assessment & Plan:   See Encounters Tab for problem based charting.  Patient seen with Dr. Eppie Gibson

## 2018-08-10 ENCOUNTER — Other Ambulatory Visit: Payer: Self-pay | Admitting: Pharmacist

## 2018-08-10 DIAGNOSIS — J441 Chronic obstructive pulmonary disease with (acute) exacerbation: Secondary | ICD-10-CM

## 2018-08-10 MED ORDER — ALBUTEROL SULFATE HFA 108 (90 BASE) MCG/ACT IN AERS: 2.0000 | INHALATION_SPRAY | Freq: Four times a day (QID) | RESPIRATORY_TRACT | 2 refills | Status: DC | PRN

## 2018-08-10 NOTE — Progress Notes (Signed)
Patient ID: [name], female   DOB: 01-Jan-1951, 67 y.o.   MRN: [id number]  I saw and evaluated the patient.  I personally confirmed the key portions of Dr. Aurelio Jew history and exam and reviewed pertinent patient test results.  The assessment, diagnosis, and plan were formulated together and I agree with the documentation in the resident's note.  Review of her CXR revealed a possible inferior subsegmental lingular infiltrate likely representing a community acquired pneumonia that is driving her COPD exacerbation.  There is increasing resistance to doxycycline in our community and this may explain the antibiotic failure.  We will broaden our antibiotic coverage with Cipro/azithromycin for the community acquired pneumonia and add prednisone 40 mg daily for 5 days and continue the bronchodilators for the COPD exacerbation.  If this regimen fails she will require inpatient admission for her COPD exacerbation as she will have failed at least two attempts at outpatient treatment.

## 2018-08-16 ENCOUNTER — Ambulatory Visit (INDEPENDENT_AMBULATORY_CARE_PROVIDER_SITE_OTHER): Payer: Medicare Other | Admitting: Internal Medicine

## 2018-08-16 ENCOUNTER — Ambulatory Visit (HOSPITAL_COMMUNITY)
Admission: RE | Admit: 2018-08-16 | Discharge: 2018-08-16 | Disposition: A | Discharge status: Home / Self Care | Payer: Medicare Other | Source: Ambulatory Visit | Attending: Internal Medicine | Admitting: Internal Medicine

## 2018-08-16 ENCOUNTER — Encounter (HOSPITAL_COMMUNITY): Payer: Self-pay | Admitting: General Practice

## 2018-08-16 ENCOUNTER — Observation Stay (HOSPITAL_COMMUNITY)
Admission: AD | Admit: 2018-08-16 | Discharge: 2018-08-17 | Disposition: A | Discharge status: Home / Self Care | Payer: Medicare Other | Source: Ambulatory Visit | Attending: Oncology | Admitting: Oncology

## 2018-08-16 ENCOUNTER — Observation Stay (HOSPITAL_COMMUNITY): Payer: Medicare Other

## 2018-08-16 ENCOUNTER — Other Ambulatory Visit: Payer: Self-pay

## 2018-08-16 VITALS — BP 150/61 | HR 83 | Temp 99.0°F | Wt 191.4 lb

## 2018-08-16 DIAGNOSIS — Z923 Personal history of irradiation: Secondary | ICD-10-CM | POA: Insufficient documentation

## 2018-08-16 DIAGNOSIS — R05 Cough: Secondary | ICD-10-CM

## 2018-08-16 DIAGNOSIS — F17210 Nicotine dependence, cigarettes, uncomplicated: Secondary | ICD-10-CM | POA: Diagnosis not present

## 2018-08-16 DIAGNOSIS — Z79899 Other long term (current) drug therapy: Secondary | ICD-10-CM | POA: Diagnosis not present

## 2018-08-16 DIAGNOSIS — Z8701 Personal history of pneumonia (recurrent): Secondary | ICD-10-CM

## 2018-08-16 DIAGNOSIS — R0981 Nasal congestion: Secondary | ICD-10-CM | POA: Diagnosis not present

## 2018-08-16 DIAGNOSIS — K219 Gastro-esophageal reflux disease without esophagitis: Secondary | ICD-10-CM | POA: Diagnosis not present

## 2018-08-16 DIAGNOSIS — J189 Pneumonia, unspecified organism: Secondary | ICD-10-CM | POA: Diagnosis not present

## 2018-08-16 DIAGNOSIS — J441 Chronic obstructive pulmonary disease with (acute) exacerbation: Secondary | ICD-10-CM | POA: Diagnosis not present

## 2018-08-16 DIAGNOSIS — J0191 Acute recurrent sinusitis, unspecified: Secondary | ICD-10-CM | POA: Diagnosis not present

## 2018-08-16 DIAGNOSIS — E785 Hyperlipidemia, unspecified: Secondary | ICD-10-CM | POA: Insufficient documentation

## 2018-08-16 DIAGNOSIS — F329 Major depressive disorder, single episode, unspecified: Secondary | ICD-10-CM | POA: Diagnosis not present

## 2018-08-16 DIAGNOSIS — Z7984 Long term (current) use of oral hypoglycemic drugs: Secondary | ICD-10-CM | POA: Insufficient documentation

## 2018-08-16 DIAGNOSIS — Z853 Personal history of malignant neoplasm of breast: Secondary | ICD-10-CM | POA: Diagnosis not present

## 2018-08-16 DIAGNOSIS — J019 Acute sinusitis, unspecified: Secondary | ICD-10-CM | POA: Insufficient documentation

## 2018-08-16 DIAGNOSIS — E119 Type 2 diabetes mellitus without complications: Secondary | ICD-10-CM | POA: Diagnosis not present

## 2018-08-16 DIAGNOSIS — I10 Essential (primary) hypertension: Secondary | ICD-10-CM | POA: Diagnosis not present

## 2018-08-16 DIAGNOSIS — R059 Cough, unspecified: Secondary | ICD-10-CM

## 2018-08-16 DIAGNOSIS — Z9221 Personal history of antineoplastic chemotherapy: Secondary | ICD-10-CM | POA: Insufficient documentation

## 2018-08-16 DIAGNOSIS — F419 Anxiety disorder, unspecified: Secondary | ICD-10-CM | POA: Diagnosis not present

## 2018-08-16 DIAGNOSIS — Z888 Allergy status to other drugs, medicaments and biological substances status: Secondary | ICD-10-CM

## 2018-08-16 DIAGNOSIS — J329 Chronic sinusitis, unspecified: Secondary | ICD-10-CM

## 2018-08-16 DIAGNOSIS — J449 Chronic obstructive pulmonary disease, unspecified: Secondary | ICD-10-CM

## 2018-08-16 DIAGNOSIS — R0602 Shortness of breath: Secondary | ICD-10-CM | POA: Diagnosis not present

## 2018-08-16 HISTORY — DX: Unspecified osteoarthritis, unspecified site: M19.90

## 2018-08-16 LAB — CBC WITH DIFFERENTIAL/PLATELET
Abs Immature Granulocytes: 0.02 10*3/uL (ref 0.00–0.07)
Basophils Absolute: 0 10*3/uL (ref 0.0–0.1)
Basophils Relative: 0 %
EOS ABS: 0.4 10*3/uL (ref 0.0–0.5)
Eosinophils Relative: 5 %
HEMATOCRIT: 41.4 % (ref 36.0–46.0)
Hemoglobin: 13.4 g/dL (ref 12.0–15.0)
IMMATURE GRANULOCYTES: 0 %
LYMPHS ABS: 2.4 10*3/uL (ref 0.7–4.0)
Lymphocytes Relative: 30 %
MCH: 29.6 pg (ref 26.0–34.0)
MCHC: 32.4 g/dL (ref 30.0–36.0)
MCV: 91.6 fL (ref 80.0–100.0)
MONOS PCT: 5 %
Monocytes Absolute: 0.4 10*3/uL (ref 0.1–1.0)
NEUTROS PCT: 60 %
Neutro Abs: 4.7 10*3/uL (ref 1.7–7.7)
Platelets: 324 10*3/uL (ref 150–400)
RBC: 4.52 MIL/uL (ref 3.87–5.11)
RDW: 12.4 % (ref 11.5–15.5)
WBC: 7.8 10*3/uL (ref 4.0–10.5)
nRBC: 0 % (ref 0.0–0.2)

## 2018-08-16 LAB — COMPREHENSIVE METABOLIC PANEL
ALBUMIN: 3.4 g/dL — AB (ref 3.5–5.0)
ALK PHOS: 77 U/L (ref 38–126)
ALT: 8 U/L (ref 0–44)
AST: 11 U/L — ABNORMAL LOW (ref 15–41)
Anion gap: 11 (ref 5–15)
BUN: 6 mg/dL — ABNORMAL LOW (ref 8–23)
CALCIUM: 9 mg/dL (ref 8.9–10.3)
CO2: 29 mmol/L (ref 22–32)
CREATININE: 0.69 mg/dL (ref 0.44–1.00)
Chloride: 98 mmol/L (ref 98–111)
GFR calc Af Amer: >60 mL/min (ref >60)
GFR calc non Af Amer: >60 mL/min (ref >60)
GLUCOSE: 130 mg/dL — AB (ref 70–99)
Potassium: 3 mmol/L — ABNORMAL LOW (ref 3.5–5.1)
SODIUM: 138 mmol/L (ref 135–145)
Total Bilirubin: 1 mg/dL (ref 0.3–1.2)
Total Protein: 6.1 g/dL — ABNORMAL LOW (ref 6.5–8.1)

## 2018-08-16 LAB — GLUCOSE, CAPILLARY
Glucose-Capillary: 155 mg/dL — ABNORMAL HIGH (ref 70–99)
Glucose-Capillary: 115 mg/dL — ABNORMAL HIGH (ref 70–99)
Glucose-Capillary: 120 mg/dL — ABNORMAL HIGH (ref 70–99)
Glucose-Capillary: 143 mg/dL — ABNORMAL HIGH (ref 70–99)
Glucose-Capillary: 174 mg/dL — ABNORMAL HIGH (ref 70–99)

## 2018-08-16 MED ORDER — POTASSIUM CHLORIDE CRYS ER 20 MEQ PO TBCR
20.0000 meq | EXTENDED_RELEASE_TABLET | Freq: Once | ORAL | Status: AC
  Administered 2018-08-16: 20 meq via ORAL
  Filled 2018-08-16: qty 1

## 2018-08-16 MED ORDER — ACETAMINOPHEN 325 MG PO TABS
650.0000 mg | ORAL_TABLET | Freq: Four times a day (QID) | ORAL | Status: DC | PRN
  Administered 2018-08-16 – 2018-08-17 (×2): 650 mg via ORAL
  Filled 2018-08-16 (×2): qty 2

## 2018-08-16 MED ORDER — FLUTICASONE-UMECLIDIN-VILANT 100-62.5-25 MCG/INH IN AEPB: 1.0000 | INHALATION_SPRAY | Freq: Every day | RESPIRATORY_TRACT | Status: DC

## 2018-08-16 MED ORDER — DM-GUAIFENESIN ER 30-600 MG PO TB12
1.0000 | ORAL_TABLET | Freq: Two times a day (BID) | ORAL | Status: DC
  Administered 2018-08-16 – 2018-08-17 (×2): 1 via ORAL
  Filled 2018-08-16 (×3): qty 1

## 2018-08-16 MED ORDER — LOSARTAN POTASSIUM 50 MG PO TABS
100.0000 mg | ORAL_TABLET | Freq: Every day | ORAL | Status: DC
  Administered 2018-08-16 – 2018-08-17 (×2): 100 mg via ORAL
  Filled 2018-08-16 (×2): qty 2

## 2018-08-16 MED ORDER — PREDNISONE 50 MG PO TABS
50.0000 mg | ORAL_TABLET | Freq: Every day | ORAL | Status: DC
  Administered 2018-08-17: 50 mg via ORAL
  Filled 2018-08-16: qty 1

## 2018-08-16 MED ORDER — LOSARTAN POTASSIUM-HCTZ 100-25 MG PO TABS: 1.0000 | ORAL_TABLET | Freq: Every day | ORAL | Status: DC

## 2018-08-16 MED ORDER — UMECLIDINIUM BROMIDE 62.5 MCG/INH IN AEPB
1.0000 | INHALATION_SPRAY | Freq: Every day | RESPIRATORY_TRACT | Status: DC
  Administered 2018-08-17: 1 via RESPIRATORY_TRACT
  Filled 2018-08-16: qty 7

## 2018-08-16 MED ORDER — ACETAMINOPHEN 650 MG RE SUPP: 650.0000 mg | Freq: Four times a day (QID) | RECTAL | Status: DC | PRN

## 2018-08-16 MED ORDER — INSULIN ASPART 100 UNIT/ML ~~LOC~~ SOLN
0.0000 [IU] | Freq: Three times a day (TID) | SUBCUTANEOUS | Status: DC
  Administered 2018-08-16 – 2018-08-17 (×3): 2 [IU] via SUBCUTANEOUS

## 2018-08-16 MED ORDER — ASPIRIN EC 81 MG PO TBEC
81.0000 mg | DELAYED_RELEASE_TABLET | Freq: Every day | ORAL | Status: DC
  Administered 2018-08-17: 81 mg via ORAL
  Filled 2018-08-16: qty 1

## 2018-08-16 MED ORDER — ALBUTEROL SULFATE (2.5 MG/3ML) 0.083% IN NEBU: 2.5000 mg | INHALATION_SOLUTION | RESPIRATORY_TRACT | Status: DC | PRN

## 2018-08-16 MED ORDER — SIMVASTATIN 40 MG PO TABS: 40.0000 mg | ORAL_TABLET | Freq: Every day | ORAL | Status: DC

## 2018-08-16 MED ORDER — POTASSIUM CHLORIDE CRYS ER 20 MEQ PO TBCR
40.0000 meq | EXTENDED_RELEASE_TABLET | Freq: Once | ORAL | Status: AC
  Administered 2018-08-16: 40 meq via ORAL
  Filled 2018-08-16: qty 2

## 2018-08-16 MED ORDER — SODIUM CHLORIDE 0.9 % IV SOLN
3.0000 g | Freq: Four times a day (QID) | INTRAVENOUS | Status: DC
  Administered 2018-08-16 – 2018-08-17 (×4): 3 g via INTRAVENOUS
  Filled 2018-08-16 (×6): qty 3

## 2018-08-16 MED ORDER — FLUTICASONE FUROATE-VILANTEROL 100-25 MCG/INH IN AEPB
1.0000 | INHALATION_SPRAY | Freq: Every day | RESPIRATORY_TRACT | Status: DC
  Administered 2018-08-17: 1 via RESPIRATORY_TRACT
  Filled 2018-08-16: qty 28

## 2018-08-16 MED ORDER — ENOXAPARIN SODIUM 40 MG/0.4ML ~~LOC~~ SOLN
40.0000 mg | SUBCUTANEOUS | Status: DC
  Administered 2018-08-16: 40 mg via SUBCUTANEOUS
  Filled 2018-08-16: qty 0.4

## 2018-08-16 MED ORDER — METOPROLOL TARTRATE 25 MG PO TABS
25.0000 mg | ORAL_TABLET | Freq: Two times a day (BID) | ORAL | Status: DC
  Administered 2018-08-16 – 2018-08-17 (×2): 25 mg via ORAL
  Filled 2018-08-16 (×2): qty 1

## 2018-08-16 MED ORDER — IPRATROPIUM-ALBUTEROL 0.5-2.5 (3) MG/3ML IN SOLN: 3.0000 mL | Freq: Four times a day (QID) | RESPIRATORY_TRACT | Status: DC

## 2018-08-16 MED ORDER — IPRATROPIUM-ALBUTEROL 0.5-2.5 (3) MG/3ML IN SOLN
3.0000 mL | Freq: Four times a day (QID) | RESPIRATORY_TRACT | Status: DC
  Administered 2018-08-16 – 2018-08-17 (×3): 3 mL via RESPIRATORY_TRACT
  Filled 2018-08-16 (×3): qty 3

## 2018-08-16 MED ORDER — PSEUDOEPHEDRINE HCL ER 120 MG PO TB12
120.0000 mg | ORAL_TABLET | Freq: Two times a day (BID) | ORAL | Status: DC
  Administered 2018-08-16 – 2018-08-17 (×2): 120 mg via ORAL
  Filled 2018-08-16 (×2): qty 1

## 2018-08-16 MED ORDER — HYDROXYZINE HCL 10 MG PO TABS
10.0000 mg | ORAL_TABLET | Freq: Three times a day (TID) | ORAL | Status: DC | PRN
  Filled 2018-08-16: qty 1

## 2018-08-16 MED ORDER — SODIUM CHLORIDE 0.9 % IV SOLN
INTRAVENOUS | Status: DC | PRN
  Administered 2018-08-16: 250 mL via INTRAVENOUS

## 2018-08-16 MED ORDER — INSULIN ASPART 100 UNIT/ML ~~LOC~~ SOLN: 0.0000 [IU] | Freq: Every day | SUBCUTANEOUS | Status: DC

## 2018-08-16 MED ORDER — SALINE SPRAY 0.65 % NA SOLN
1.0000 | NASAL | Status: DC | PRN
  Administered 2018-08-16: 1 via NASAL
  Filled 2018-08-16: qty 44

## 2018-08-16 MED ORDER — TRIAMCINOLONE ACETONIDE 55 MCG/ACT NA AERO
1.0000 | INHALATION_SPRAY | Freq: Every day | NASAL | Status: DC
  Administered 2018-08-17: 1 via NASAL
  Filled 2018-08-16: qty 10.8

## 2018-08-16 MED ORDER — HYDROCHLOROTHIAZIDE 25 MG PO TABS
25.0000 mg | ORAL_TABLET | Freq: Every day | ORAL | Status: DC
  Administered 2018-08-16 – 2018-08-17 (×2): 25 mg via ORAL
  Filled 2018-08-16 (×2): qty 1

## 2018-08-16 MED ORDER — LORATADINE 10 MG PO TABS
10.0000 mg | ORAL_TABLET | Freq: Every day | ORAL | Status: DC
  Administered 2018-08-16 – 2018-08-17 (×2): 10 mg via ORAL
  Filled 2018-08-16 (×2): qty 1

## 2018-08-16 MED ORDER — IPRATROPIUM BROMIDE 0.02 % IN SOLN
0.5000 mg | Freq: Once | RESPIRATORY_TRACT | Status: AC
  Administered 2018-08-16: 0.5 mg via RESPIRATORY_TRACT

## 2018-08-16 MED ORDER — METFORMIN HCL 500 MG PO TABS: 1000.0000 mg | ORAL_TABLET | Freq: Two times a day (BID) | ORAL | Status: DC

## 2018-08-16 MED ORDER — DICLOFENAC SODIUM 1 % TD GEL
2.0000 g | Freq: Two times a day (BID) | TRANSDERMAL | Status: DC | PRN
  Administered 2018-08-16 – 2018-08-17 (×2): 2 g via TOPICAL
  Filled 2018-08-16 (×2): qty 100

## 2018-08-16 MED ORDER — ALBUTEROL SULFATE (2.5 MG/3ML) 0.083% IN NEBU
2.5000 mg | INHALATION_SOLUTION | Freq: Once | RESPIRATORY_TRACT | Status: AC
  Administered 2018-08-16: 2.5 mg via RESPIRATORY_TRACT

## 2018-08-16 MED ORDER — BUPROPION HCL ER (XL) 150 MG PO TB24
300.0000 mg | ORAL_TABLET | Freq: Every day | ORAL | Status: DC
  Administered 2018-08-16 – 2018-08-17 (×2): 300 mg via ORAL
  Filled 2018-08-16 (×2): qty 2

## 2018-08-16 NOTE — H&P (Signed)
Date: 08/16/2018               Patient Name:  [name] MRN: [id number]  DOB: 06-29-1951 Age / Sex: 67 y.o., female   PCP: Welford Roche, MD         Medical Service: Internal Medicine Teaching Service         Attending Physician: Dr. [name], MD    First Contact: Dr. Laural Golden Pager: [phone]  Second Contact: Dr. Heber  Pager: [phone]       After Hours (After 5p/  First Contact Pager: [phone]  weekends / holidays): Second Contact Pager: [phone]   Chief Complaint: Worsening nasal congestion and persistent cough.  History of Present Illness: [name], 67 y.o lady with PMHx significant for COPD, hypertension, diabetes, anxiety, remote history of right breast cancer treated with lumpectomy, chemotherapy and radiation in 1991, recently treated with outpatient prednisone and antibiotics for pneumonia and COPD exacerbation twice was admitted from clinic where she presented due to worsening nasal congestion and persistent cough for more than a month now.  Patient initially presented to an urgent care on July 24, 2018 with worsening cough with sputum production and shortness of breath, treated with a course of doxycycline and prednisone with no improvement.  She presented to the emergency room on August 08, 2018 with similar complaints, remained afebrile with no leukocytosis, chest x-ray with a small subsegmental lingular infiltrate and she was given a course of Cipro and azithromycin along with prednisone for community-acquired pneumonia causing COPD exacerbation.  According to patient she did completed that course with not much improvement.  She presented today to clinic with similar complaints.  According to patient her cough and sputum production did show some improvement but she continued to experience worsening nasal and sinus congestion.  She denies any fever but did experience some night sweats over the past few nights.  She is experiencing  headache which got worse with bending over.  She was complaining of thick greenish nasal discharge. She did experience mild nausea today but denies any vomitus, her appetite is normal.   She was experiencing some loose stools for the past 3 days, initially it was loose now semi-formed, 3 episodes of semi-formed stools since this morning, she denies any abdominal pain. She denies any generalized aches and pains. She denies any urinary symptoms.  Meds:  No outpatient medications have been marked as taking for the 08/16/18 encounter [facility] Encounter).     Allergies: Allergies as of 08/16/2018 - Review Complete 08/16/2018  Allergen Reaction Noted  . Ace inhibitors Swelling 12/26/2014  . Flonase [fluticasone] Other (See Comments) 12/25/2014   Past Medical History:  Diagnosis Date  . Anxiety   . Asthma   . Bronchitis    h/o  . Cancer of right breast ([place]) 1991   s/p lumpectomy, chemotherapy and radiation therapy in 1991. Mammogram in 2007 was normal.  . CAP (community acquired pneumonia) 08/16/2018  . Constipated    h/o  . COPD (chronic obstructive pulmonary disease) ([place])    History of multiple hospital admissions for exercabation   . COPD with exacerbation ([place]) 04/06/2009   Qualifier: Diagnosis of  By: Eyvonne Mechanic MD, Vijay    . Depression   . Diarrhea    h/o  . GERD (gastroesophageal reflux disease)   . Headache    "a few times/month" (07/22/2016)  . Heart murmur 10/05/11   "first time I ever heard I had one was today"  . Hyperlipidemia   .  Hypertension   . Obesity   . Personal history of chemotherapy   . Personal history of radiation therapy   . Pneumonia    "couple times in the last 10-15 yrs" (08/16/2018)  . Shortness of breath 10/05/11   "at rest; lying down; w/exertion"  . Sigmoid diverticulitis 80/2008  . Tobacco abuse   . Type II diabetes mellitus ([place])     Family History: Mother with breast cancer and died of uterine cancer.  Social History: Long-term smoker  for more than 50 years, currently smoking half a pack per day used to smoke more, 1-2 beers per week, denies any illicit drug use.  Review of Systems: A complete ROS was negative except as per HPI.   Physical Exam: There were no vitals taken for this visit. There were no vitals filed for this visit. General: Vital signs reviewed.  Patient is well-developed and well-nourished, in no acute distress and cooperative with exam.  HENT: Normocephalic and atraumatic.  No facial tenderness, bilateral erythematous and mildly edematous nasal turbinate, no pharyngeal erythema or exudate. Eyes: EOMI, conjunctivae normal, no scleral icterus.  Neck: Supple, trachea midline, normal ROM, no JVD, masses, thyromegaly, or carotid bruit present.  Cardiovascular: RRR, S1 normal, S2 normal, no murmurs, gallops, or rubs. Pulmonary/Chest: Few scattered expiratory wheeze. Abdominal: Soft, non-tender, non-distended, BS +, no masses, organomegaly, or guarding present.  Extremities: No lower extremity edema bilaterally,  pulses symmetric and intact bilaterally. No cyanosis or clubbing. Neurological: A&O x3, Strength is normal and symmetric bilaterally, cranial nerve II-XII are grossly intact, no focal motor deficit, sensory intact to light touch bilaterally.  Skin: Warm, dry and intact. No rashes or erythema. Psychiatric: The patient was very tearful and appears anxious.  CXR: personally reviewed my interpretation is unchanged subsegmental lingular opacity, might be a pericardial fat.  Assessment & Plan by Problem: [name], 67 y.o lady with PMHx significant for COPD, hypertension, diabetes, remote history of right breast cancer treated with lumpectomy, chemotherapy and radiation in 1991, recently treated with outpatient prednisone and antibiotics for pneumonia and COPD exacerbation twice was admitted from clinic where she presented due to worsening nasal congestion and persistent cough for more than a month  now.  Sinusitis.  Her symptoms are more consistent with sinusitis. Repeat chest x-ray without any acute change. Slight improvement in cough. Patient remained afebrile. -Sinus x-ray. -Unasyn. -Nasacort nasal spray. -Saline nasal spray -Claritin with pseudoephedrine.  COPD Exacerbation.  Most likely secondary to persistent sinusitis.  Doubt it is pneumonia. -Prednisone 40 mg daily for 5 days-might need a slow taper. -Getting Unasyn. -DuoNeb every 6 hourly. -Albuterol as needed.  Diabetes.  Well controlled with A1c of 5.4 in August 2019. -Continue home dose of metformin. -SSI if needed.  Hypertension.  Continuing home dose of losartan-HCTZ and metoprolol.  Anxiety.  Continuing home dose of hydroxyzine and Wellbutrin.  CODE STATUS. Full DVT prophylaxis.  Lovenox Diet.  Heart healthy/carb modified.   Dispo: Admit patient to Observation with expected length of stay less than 2 midnights.  SignedLorella Nimrod, MD 08/16/2018, 4:41 PM

## 2018-08-16 NOTE — Progress Notes (Signed)
Report called to [facility] on [address]. Patient transported to [facility] via wheelchair.  Remains alert and oriented on room air.  Sander Nephew, RN 08/16/2018 3:32 PM.

## 2018-08-16 NOTE — Progress Notes (Signed)
Internal Medicine Clinic Attending  Case discussed with Dr. Guilloud at the time of the visit.  We reviewed the resident's history and exam and pertinent patient test results.  I agree with the assessment, diagnosis, and plan of care documented in the resident's note.  

## 2018-08-16 NOTE — Addendum Note (Signed)
Addended by: Jodean Lima on: 08/16/2018 01:26 PM   Modules accepted: Orders

## 2018-08-16 NOTE — Progress Notes (Signed)
Pt arrived to [facility], internal med notified

## 2018-08-16 NOTE — Progress Notes (Signed)
Pharmacy Antibiotic Note  [name] is a 67 y.o. female admitted on 08/16/2018 with persistent sinusitis.  Pharmacy has been consulted for Unasyn dosing for sinusittis and pneumonia coverage. Symptoms noted to have progressed on 2 courses of oral antibiotics.  Plan:  Unasyn 3 gm IV q6hrs.  Follow renal function, any culture data, clinical progress and antibiotic plans.  Temp (24hrs), Avg:98.9 F (37.2 C), Min:98.7 F (37.1 C), Max:99 F (37.2 C)  08/08/18 labs:  creatinine 0.82, WBC 7.1.  Estimated Creatinine Clearance: 73.9 mL/min (by C-G formula based on SCr of 0.82 mg/dL).    Allergies  Allergen Reactions  . Ace Inhibitors Swelling    Throat swelling.  [name] [Fluticasone] Other (See Comments)    Sinuses stop up and condition worsens    Antimicrobials this admission:  Unasyn 10/9>>  Dose adjustments this admission:  n/a  Microbiology results:  n/a  Thank you for allowing pharmacy to be a part of this patient's care.  Arty Baumgartner, [place] Pager: [phone]  08/16/2018 7:07 PM

## 2018-08-16 NOTE — Addendum Note (Signed)
Addended by: Sander Nephew F on: 08/16/2018 03:02 PM   Modules accepted: Orders

## 2018-08-16 NOTE — Assessment & Plan Note (Signed)
Patient is here for follow-up of her COPD exacerbation. She continues to have symptoms of cough, shortness of breath, and sputum production now for 1 month.  She has failed 2 outpatient courses of antibiotics and steroids.  She was initially seen in the ED and treated with doxycycline and prednisone.  After no improvement, she followed up in our clinic 1 week ago and was given a prescription for Cipro, azithromycin, bronchodilators, and an additional prednisone course.  Patient reports no improvement of her symptoms.  Chest x-ray on 08/08/2018 demonstrated a left lingular infiltrate. Today on exam, she has left lower lobe rales and inspiratory wheezing.  Overall, patient continues to have COPD exacerbation secondary to community-acquired pneumonia despite 2 outpatient courses of antibiotics and steroids.  Will admit for further treatment. -- Admit to inpatient -- Recommend IV Ceftriaxone for strep coverage, IV steroids, and scheduled duonebs

## 2018-08-16 NOTE — Assessment & Plan Note (Signed)
>>  ASSESSMENT AND PLAN FOR CHRONIC OBSTRUCTIVE PULMONARY DISEASE WITH BRONCHOSPASM ([place]) WRITTEN ON 08/16/2018  9:03 AM BY Velna Ochs, MD  Patient is here for follow-up of her COPD exacerbation. She continues to have symptoms of cough, shortness of breath, and sputum production now for 1 month.  She has failed 2 outpatient courses of antibiotics and steroids.  She was initially seen in the ED and treated with doxycycline and prednisone.  After no improvement, she followed up in our clinic 1 week ago and was given a prescription for Cipro, azithromycin, bronchodilators, and an additional prednisone course.  Patient reports no improvement of her symptoms.  Chest x-ray on 08/08/2018 demonstrated a left lingular infiltrate. Today on exam, she has left lower lobe rales and inspiratory wheezing.  Overall, patient continues to have COPD exacerbation secondary to community-acquired pneumonia despite 2 outpatient courses of antibiotics and steroids.  Will admit for further treatment. -- Admit to inpatient -- Recommend IV Ceftriaxone for strep coverage, IV steroids, and scheduled duonebs

## 2018-08-16 NOTE — Progress Notes (Signed)
   CC: Cough  HPI:  [name] is a 67 y.o. female with past medical history outlined below here for cough. For the details of today's visit, please refer to the assessment and plan.  Past Medical History:  Diagnosis Date  . Anxiety   . Asthma   . Bronchitis    h/o  . Cancer of right breast ([place]) 1991   s/p lumpectomy, chemotherapy and radiation therapy in 1991. Mammogram in 2007 was normal.  . Constipated    h/o  . COPD (chronic obstructive pulmonary disease) ([place])    History of multiple hospital admissions for exercabation   . COPD with exacerbation ([place]) 04/06/2009   Qualifier: Diagnosis of  By: Eyvonne Mechanic MD, Vijay    . Depression   . Diarrhea    h/o  . GERD (gastroesophageal reflux disease)   . Headache    "a few times/month" (07/22/2016)  . Heart murmur 10/05/11   "first time I ever heard I had one was today"  . Hyperlipidemia   . Hypertension   . Obesity   . Personal history of chemotherapy   . Personal history of radiation therapy   . Pneumonia   . Shortness of breath 10/05/11   "at rest; lying down; w/exertion"  . Sigmoid diverticulitis 80/2008  . Tobacco abuse   . Type II diabetes mellitus ([place])     Review of Systems  Constitutional: Negative for fever.  Respiratory: Positive for cough, sputum production and shortness of breath.     Physical Exam:  Vitals:   08/16/18 0836  BP: (!) 178/87  Pulse: (!) 115  Temp: 99 F (37.2 C)  TempSrc: Oral  SpO2: 100%  Weight: 191 lb 6.4 oz (86.8 kg)    Constitutional: NAD, appears comfortable Cardiovascular: RRR, no murmurs, rubs, or gallops.  Pulmonary/Chest: Left lower lobe rales and inspiratory wheezing Extremities: Warm and well perfused.  No edema.  Psychiatric: Normal mood and affect  Assessment & Plan:   See Encounters Tab for problem based charting.  Patient discussed with Dr. Dareen Piano

## 2018-08-17 DIAGNOSIS — E785 Hyperlipidemia, unspecified: Secondary | ICD-10-CM | POA: Diagnosis not present

## 2018-08-17 DIAGNOSIS — J019 Acute sinusitis, unspecified: Secondary | ICD-10-CM | POA: Diagnosis not present

## 2018-08-17 DIAGNOSIS — I10 Essential (primary) hypertension: Secondary | ICD-10-CM | POA: Diagnosis not present

## 2018-08-17 DIAGNOSIS — F329 Major depressive disorder, single episode, unspecified: Secondary | ICD-10-CM | POA: Diagnosis not present

## 2018-08-17 DIAGNOSIS — E119 Type 2 diabetes mellitus without complications: Secondary | ICD-10-CM | POA: Diagnosis not present

## 2018-08-17 DIAGNOSIS — J441 Chronic obstructive pulmonary disease with (acute) exacerbation: Secondary | ICD-10-CM | POA: Diagnosis not present

## 2018-08-17 LAB — CBC
HCT: 41.2 % (ref 36.0–46.0)
Hemoglobin: 13.3 g/dL (ref 12.0–15.0)
MCH: 30 pg (ref 26.0–34.0)
MCHC: 32.3 g/dL (ref 30.0–36.0)
MCV: 93 fL (ref 80.0–100.0)
PLATELETS: 318 10*3/uL (ref 150–400)
RBC: 4.43 MIL/uL (ref 3.87–5.11)
RDW: 12.5 % (ref 11.5–15.5)
WBC: 6.3 10*3/uL (ref 4.0–10.5)
nRBC: 0 % (ref 0.0–0.2)

## 2018-08-17 LAB — BASIC METABOLIC PANEL
ANION GAP: 8 (ref 5–15)
BUN: 9 mg/dL (ref 8–23)
CO2: 28 mmol/L (ref 22–32)
Calcium: 9.1 mg/dL (ref 8.9–10.3)
Chloride: 101 mmol/L (ref 98–111)
Creatinine, Ser: 0.77 mg/dL (ref 0.44–1.00)
GFR calc Af Amer: >60 mL/min (ref >60)
GFR calc non Af Amer: >60 mL/min (ref >60)
GLUCOSE: 108 mg/dL — AB (ref 70–99)
POTASSIUM: 4 mmol/L (ref 3.5–5.1)
SODIUM: 137 mmol/L (ref 135–145)

## 2018-08-17 LAB — GLUCOSE, CAPILLARY
GLUCOSE-CAPILLARY: 164 mg/dL — AB (ref 70–99)
Glucose-Capillary: 121 mg/dL — ABNORMAL HIGH (ref 70–99)
Glucose-Capillary: 145 mg/dL — ABNORMAL HIGH (ref 70–99)

## 2018-08-17 MED ORDER — AMOXICILLIN-POT CLAVULANATE 875-125 MG PO TABS: 1.0000 | ORAL_TABLET | Freq: Two times a day (BID) | ORAL | 0 refills | Status: AC

## 2018-08-17 MED ORDER — TRIAMCINOLONE ACETONIDE 55 MCG/ACT NA AERO: 1.0000 | INHALATION_SPRAY | Freq: Every day | NASAL | 12 refills | Status: DC

## 2018-08-17 MED ORDER — PREDNISONE 20 MG PO TABS: 40.0000 mg | ORAL_TABLET | Freq: Every day | ORAL | 0 refills | Status: AC

## 2018-08-17 NOTE — Discharge Summary (Signed)
Name: [name] MRN: [id number] DOB: Dec 11, 1950 67 y.o. PCP: Burna Cash, MD  Date of Admission: 08/16/2018  4:17 PM Date of Discharge: 08/17/2018 Attending Physician: Dr. Cyndie Chime  Discharge Diagnosis: 1. Acute sinusitis 2. COPD exacerbation  Discharge Medications: Allergies as of 08/17/2018      Reactions   Ace Inhibitors Swelling   Throat swelling. Lisinopril-HCTZ   Flonase [fluticasone] Other (See Comments)   Sinuses stop up and condition worsens      Medication List    TAKE these medications   ACCU-CHEK FASTCLIX LANCETS Misc 1 each by Other route See admin instructions. Check blood sugar daily as needed for high blood sugar.   ACCU-CHEK NANO SMARTVIEW w/Device Kit 1 each by Other route See admin instructions. Check blood sugar daily as needed for high blood sugar.   acetaminophen 325 MG tablet Commonly known as:  TYLENOL Take 650 mg by mouth 2 (two) times daily as needed for headache.   albuterol 108 (90 Base) MCG/ACT inhaler Commonly known as:  PROVENTIL HFA;VENTOLIN HFA Inhale 2 puffs into the lungs every 6 (six) hours as needed for wheezing or shortness of breath.   amoxicillin-clavulanate 875-125 MG tablet Commonly known as:  AUGMENTIN Take 1 tablet by mouth 2 (two) times daily for 6 days.   benzonatate 100 MG capsule Commonly known as:  TESSALON Take 1 capsule (100 mg total) by mouth every 8 (eight) hours.   betamethasone valerate ointment 0.1 % Commonly known as:  VALISONE Apply 1 application topically 2 (two) times daily.   buPROPion 300 MG 24 hr tablet Commonly known as:  WELLBUTRIN XL Take 1 tablet (300 mg total) by mouth daily.   cetirizine 10 MG tablet Commonly known as:  ZYRTEC Take 1 tablet (10 mg total) by mouth daily.   diclofenac sodium 1 % Gel Commonly known as:  VOLTAREN Apply 2 g topically 2 (two) times daily as needed.   glucose blood test strip Use to check blood sugar 1 to 2 times daily. diag code E  11.9. Non- insulin dependent   Guaifenesin 1200 MG Tb12 Take 1 tablet (1,200 mg total) by mouth every 12 (twelve) hours as needed.   hydrOXYzine 10 MG tablet Commonly known as:  ATARAX/VISTARIL Take 1 tablet (10 mg total) by mouth 3 (three) times daily as needed for itching or anxiety.   ipratropium-albuterol 0.5-2.5 (3) MG/3ML Soln Commonly known as:  DUONEB Take 3 mLs by nebulization every 6 (six) hours as needed.   levocetirizine 5 MG tablet Commonly known as:  XYZAL Take 2 x a day for the itching   losartan-hydrochlorothiazide 100-25 MG tablet Commonly known as:  HYZAAR TAKE 1 TABLET BY MOUTH EVERY DAY   metFORMIN 1000 MG tablet Commonly known as:  GLUCOPHAGE TAKE 1 TABLET BY MOUTH TWICE DAILY   metoprolol tartrate 25 MG tablet Commonly known as:  LOPRESSOR Take 1 tablet (25 mg total) by mouth 2 (two) times daily.   predniSONE 20 MG tablet Commonly known as:  DELTASONE Take 2 tablets (40 mg total) by mouth daily with breakfast for 4 days. Start taking on:  08/18/2018   pseudoephedrine 120 MG 12 hr tablet Commonly known as:  SUDAFED Take 1 tablet (120 mg total) by mouth 2 (two) times daily as needed for congestion.   RA ASPIRIN EC ADULT LOW ST 81 MG EC tablet Generic drug:  aspirin take 1 tablet by mouth once daily   silver sulfADIAZINE 1 % cream Commonly known as:  SILVADENE Apply 1 application  topically daily.   simvastatin 40 MG tablet Commonly known as:  ZOCOR Take 1 tablet (40 mg total) by mouth daily at 6 PM.   TRELEGY ELLIPTA 100-62.5-25 MCG/INH Aepb Generic drug:  Fluticasone-Umeclidin-Vilant Inhale into the lungs.   triamcinolone 55 MCG/ACT Aero nasal inhaler Commonly known as:  NASACORT Place 1 spray into the nose daily.       Disposition and follow-up:   ChristieVirginia Francisco A [name] was discharged from [facility] in Good condition.  At the hospital follow up visit please address:  1.   Acute sinusitis: [name] was given  Unasyn for 24 hours during her admission and discharged on augmentin (last day 10/15). Advised she f/u in [facility] next week if her symptoms persist. If her symptoms have persisted, consider sinus CT to determine if she needs a sinus drainage procedure.  COPD exacerbation: Started on a 5-day course of prednisone for wheezing and dyspnea along with her sinusitis.   2.  Labs / imaging needed at time of follow-up: none  3.  Pending labs/ test needing follow-up: none  Follow-up Appointments:   Hospital Course by problem list: 1. Acute sinusitis: [name] is a 67 y.o woman with medical historysignificant for COPD, hypertension,diabetes,remote history of right breast cancer treated with lumpectomy, chemotherapy and radiation in 199 who presented with persistent sinus congestion and cough for over one month. She has had no improvement despite a course of doxycycline plus prednisone and a later course of cipro, azithromycin, and prednisone. CXR has showed a stable small subsegmental lingular infiltrate. She was previously treated for CAP based on these findings. However, because she has not improved on appropriate therapy for CAP and her symptoms are primarily upper airway, her picture is more consistent with sinusitis. Sinus xray was also consistent with acute bilateral maxillary sinusitis. She was started on Unasyn and Nasacort and was provided supportive therapy with her home antihistamine and pseudoephedrine. On hospital day 2, she felt improved and appeared medically stable for discharge home. She was discharged home with Augmentin for a total abx course of 7 days  2. COPD exacerbation: Presented with diffuse expiratory wheezing. Likely COPD exacerbation in the setting of persistent sinusitis. She was treated with duonebs and started on a 5-day course of prednisone. Wheezing improved on physical exam by day of discharge.  Discharge Vitals:   BP (!) 155/78 (BP Location: Right Arm)   Pulse 69    Temp 98.6 F (37 C) (Oral)   Resp 16   SpO2 100%   Pertinent Labs, Studies, and Procedures:  CBC Latest Ref Rng & Units 08/17/2018 08/16/2018 08/08/2018  WBC 4.0 - 10.5 K/uL 6.3 7.8 7.1  Hemoglobin 12.0 - 15.0 g/dL 72.5 36.6 44.0  Hematocrit 36.0 - 46.0 % 41.2 41.4 44.2  Platelets 150 - 400 K/uL 318 324 296   CMP Latest Ref Rng & Units 08/17/2018 08/16/2018 08/08/2018  Glucose 70 - 99 mg/dL 347(Q) 259(D) 638(V)  BUN 8 - 23 mg/dL 9 6(L) 5(L)  Creatinine 0.44 - 1.00 mg/dL 5.64 3.32 9.51  Sodium 135 - 145 mmol/L 137 138 141  Potassium 3.5 - 5.1 mmol/L 4.0 3.0(L) 3.6  Chloride 98 - 111 mmol/L 101 98 103  CO2 22 - 32 mmol/L 28 29 25   Calcium 8.9 - 10.3 mg/dL 9.1 9.0 9.9  Total Protein 6.5 - 8.1 g/dL - 6.1(L) -  Total Bilirubin 0.3 - 1.2 mg/dL - 1.0 -  Alkaline Phos 38 - 126 U/L - 77 -  AST  15 - 41 U/L - 11(L) -  ALT 0 - 44 U/L - 8 -   CXR: COPD without acute abnormality. Sinus Xray: acute bilateral maxillary sinusitis  Discharge Instructions: Discharge Instructions    Discharge instructions   Complete by:  As directed    It was a pleasure taking care of you, Ms. Kulpa!  1. You were hospitalized for a sinus infection. This has been causing the pressure pain in your head, your congestion, and your cough. You were given IV antibiotics to start clearing the infection. You are safe for discharge home today.   2. Please continue taking oral antibiotics. Take augmentin 875mg  twice a day for 6 days. You can take your first dose of this tonight (10/10).  3. Continue to take prednisone 40mg  daily for 4 more days (take your next prednisone pill tomorrow 10/11). We have also prescribed you a nasal steroid called Nasacort that you can take for nasal congestion. This medication will penetrate the sinuses and help clear the inflammation.   4. Please follow-up in clinic next Thursday 10/17 if you continue to feel bad. If your symptoms have completely resolved, you can cancel this appointment.     Feel free to call our clinic at [phone] if you have any questions.  Thanks! Dr. Avie Arenas      Signed: Abryanna Musolino, Cathleen Corti, MD 08/17/2018, 3:38 PM   Pager: [phone]

## 2018-08-17 NOTE — Progress Notes (Signed)
Patient given discharge instructions and patient verbalized understanding. Patient left unit in stable condition via wheelchair.

## 2018-08-17 NOTE — Progress Notes (Signed)
Medicine attending discharge note: I personally examined this patient on the day of discharge and I attest to the accuracy of the discharge evaluation and plan as recorded in the final progress note and which will be subsequently detailed in the discharge summary by resident physician Dr Vilma Prader.  Brief admission for this 67 year old woman with mixed COPD asthma who has been struggling with a persistent cough for the last month.  Increasing sinus congestion.  Symptoms did not improve on 2 sequential courses of outpatient antibiotics and steroids. Chest x-ray on October 1 with suspected early left lingular infiltrate.  Repeat chest x-ray on day of admission did not show any progressive infiltrates or effusions. On exam she was afebrile, nasal congestion, pharynx not injected, lungs with diffuse expiratory wheezes, no lymphadenopathy, regular cardiac rhythm without murmur. White count 7800 with 60% neutrophils, 30% lymphocytes. Potassium 3.0. Sinus x-ray showed bilateral acute maxillary sinusitis. She was started on Unasyn.  Condition improved overnight.  We felt she was stable to transition to outpatient antibiotics with Augmentin.  Continue decongestants, antihistamines, nasal spray, and a short course of steroids.  Disposition: Condition stable at time of discharge Follow-up in our general medicine clinic There were no complications

## 2018-08-17 NOTE — Progress Notes (Signed)
   Subjective: No overnight events. [name] reports that she overall feels better today although she continues to have congestion and cough. She has no new symptoms or concerns.   Objective:  Vital signs in last 24 hours: Vitals:   08/16/18 2028 08/16/18 2200 08/17/18 0244 08/17/18 0607  BP:  (!) 151/75  137/66  Pulse:  91  75  Resp:  18  18  Temp:  98.6 F (37 C)  98.7 F (37.1 C)  TempSrc:  Oral  Oral  SpO2: 100% 98% 100% 97%   Constitutional: Sitting comfortably in bed. Cardiovascular: Normal rate and regular rhythm. No murmurs, rubs, or gallops. Pulmonary/Chest: Effort normal. Clear to auscultation bilaterally. Mild end-expiratory wheezes bilaterally.  Ext: No lower extremity edema. Skin: Warm and dry. No rashes or wounds.  Assessment/Plan:  Active Problems:   Chronic obstructive airway disease with asthma (HCC)   Pneumonia   Acute recurrent sinusitis  [name] is a 67 y.o woman with medical history significant for COPD, hypertension, diabetes, remote history of right breast cancer treated with lumpectomy, chemotherapy and radiation in 199 who presented with persistent sinus congestion and cough for over one month. She has had no improvement despite a course of doxycycline plus prednisone and a later course of cipro, azithromycin, and prednisone. CXR has showed a stable small subsegmental lingular infiltrate. She was previously treated for CAP based on these findings. However, because she has not improved on appropriate therapy for CAP and her symptoms are primarily upper airway, her picture is more consistent with sinusitis.  Acute Sinusitis - Sinus xray showed acute maxillary sinusitis - She was started on Unasyn and Nasacort yesterday. Her home antihistamine and pseudoephedrine were continued.  - She feels improved today and appears appropriate for transition to oral antibiotics and discharge home. Will transition from Unasyn to Augmentin for a total abx  course of 7 days. Plan - Discharge home with augmentin and nasacort. - Continue her home antihistamine and pseudoephedrine.  COPD Exacerbation.   - Presented with diffuse expiratory wheezing. Likely COPD exacerbation in the setting of persistent sinusitis. She has had two short courses of prednisone already.  - Today she has mild end-expiratory wheezing, but she appears and feels improved. Plan - Continue prednisone (day 1/5) - DuoNeb every 6 hourly. - Albuterol as needed.  Dispo: Anticipated discharge today.  [name], Andree Elk, MD 08/17/2018, 6:46 AM Pager: [phone]

## 2018-08-24 ENCOUNTER — Other Ambulatory Visit: Payer: Self-pay | Admitting: Internal Medicine

## 2018-08-24 ENCOUNTER — Ambulatory Visit (INDEPENDENT_AMBULATORY_CARE_PROVIDER_SITE_OTHER): Payer: Medicare Other | Admitting: Internal Medicine

## 2018-08-24 ENCOUNTER — Telehealth: Payer: Self-pay | Admitting: Dietician

## 2018-08-24 ENCOUNTER — Other Ambulatory Visit: Payer: Self-pay

## 2018-08-24 ENCOUNTER — Encounter: Payer: Self-pay | Admitting: Internal Medicine

## 2018-08-24 VITALS — BP 161/81 | HR 93 | Temp 98.2°F | Ht 66.0 in | Wt 190.2 lb

## 2018-08-24 DIAGNOSIS — F17210 Nicotine dependence, cigarettes, uncomplicated: Secondary | ICD-10-CM | POA: Diagnosis not present

## 2018-08-24 DIAGNOSIS — J449 Chronic obstructive pulmonary disease, unspecified: Secondary | ICD-10-CM | POA: Diagnosis not present

## 2018-08-24 DIAGNOSIS — Z79899 Other long term (current) drug therapy: Secondary | ICD-10-CM

## 2018-08-24 DIAGNOSIS — R61 Generalized hyperhidrosis: Secondary | ICD-10-CM

## 2018-08-24 DIAGNOSIS — I499 Cardiac arrhythmia, unspecified: Secondary | ICD-10-CM

## 2018-08-24 DIAGNOSIS — J441 Chronic obstructive pulmonary disease with (acute) exacerbation: Secondary | ICD-10-CM

## 2018-08-24 DIAGNOSIS — J0101 Acute recurrent maxillary sinusitis: Secondary | ICD-10-CM | POA: Diagnosis not present

## 2018-08-24 DIAGNOSIS — J0191 Acute recurrent sinusitis, unspecified: Secondary | ICD-10-CM

## 2018-08-24 MED ORDER — PREDNISONE 20 MG PO TABS: 40.0000 mg | ORAL_TABLET | Freq: Every day | ORAL | 0 refills | Status: AC

## 2018-08-24 MED ORDER — IPRATROPIUM-ALBUTEROL 0.5-2.5 (3) MG/3ML IN SOLN: 3.0000 mL | Freq: Four times a day (QID) | RESPIRATORY_TRACT | 3 refills | Status: DC | PRN

## 2018-08-24 NOTE — Progress Notes (Signed)
   CC: refractory sinusitis  HPI:  [name] is a 67 y.o. with PMH as below presenting for follow-up after hospital admission for acute sinusitis, discharged on augmenten.   Please see A&P for assessment of the patient's chronic medical conditions.   Past Medical History:  Diagnosis Date  . Anxiety   . Arthritis    "fingers, knees" (08/16/2018)  . Asthma   . Bronchitis    h/o  . Cancer of right breast ([place]) 1991   s/p lumpectomy, chemotherapy and radiation therapy in 1991. Mammogram in 2007 was normal.  . CAP (community acquired pneumonia) 08/16/2018  . Constipated    h/o  . COPD (chronic obstructive pulmonary disease) ([place])    History of multiple hospital admissions for exercabation   . COPD with exacerbation ([place]) 04/06/2009   Qualifier: Diagnosis of  By: Eyvonne Mechanic MD, Vijay    . Depression   . Diarrhea    h/o  . GERD (gastroesophageal reflux disease)   . Headache    "a few times/month" (08/16/2018  . Heart murmur 10/05/11   "first time I ever heard I had one was today"  . Hyperlipidemia   . Hypertension   . Obesity   . Personal history of chemotherapy   . Personal history of radiation therapy   . Pneumonia    "couple times in the last 10-15 yrs" (08/16/2018)  . Shortness of breath 10/05/11   "at rest; lying down; w/exertion"  . Sigmoid diverticulitis 80/2008  . Tobacco abuse   . Type II diabetes mellitus ([place])    Review of Systems:   Review of Systems  Constitutional: Positive for diaphoresis (night sweats), fever and malaise/fatigue.  HENT: Positive for congestion and sore throat. Negative for ear discharge, ear pain, hearing loss and sinus pain.   Eyes: Negative for pain, discharge and redness.  Respiratory: Positive for cough, sputum production and shortness of breath. Negative for hemoptysis and wheezing.   Cardiovascular: Negative for chest pain, palpitations and leg swelling.  Neurological: Negative for dizziness and headaches.  All other systems  reviewed and are negative.  Physical Exam:  Constitution: NAD HENT: TM wnl b/l, turbinates swollen & red bilaterally, throat non-erythematous, no cervical or supraclavicular lymphadenopathy Eyes: no scleral icterus, non-injected  Cardio: irregular rhythm, no m/r/g Respiratory: decreased breath sounds, no wheezing or crackles Neuro: A&Ox3, cooperative Skin: c/d/i   Vitals:   08/24/18 1325  BP: (!) 161/81  Pulse: 93  Temp: 98.2 F (36.8 C)  TempSrc: Oral  SpO2: 99%  Weight: 190 lb 3.2 oz (86.3 kg)  Height: 5\' 6"  (1.676 m)     Assessment & Plan:   See Encounters Tab for problem based charting.  Patient seen with Dr. Rebeca Alert

## 2018-08-24 NOTE — Telephone Encounter (Signed)
Steroid-Induced Hyperglycemia Prevention and Management [name] is a 67 y.o. female who meets criteria for [facility] glucose monitoring program (diabetes patient prescribed short course of steroids).  A/P Current Regimen  Patient prescribed prednisone 40 mg daily x 3 days, currently on day 0 of therapy. Patient taking prednisone in the AM  Prednisone indication: COPD  Current DM regimen metformin 1000 mg twice daily   Home BG Monitoring  Patient does have a meter at home and does check BG at home. Meter was not supplied.  CBGs at home unknown CBGs prior to steroid course unknown, but a1c indicates well controlled A1C prior to steroid course  Lab Results  Component Value Date   HGBA1C 5.4 06/21/2018       S/Sx of hyperglycemia: reviewed in message,   Medication Management  Switch prednisone dose to AM not applicable  Additional treatment for BG control is not indicated at this time.  Patient Education  Advised patient to monitor BG while on steroid therapy (at least twice daily prior to first 2 meals of the day).  Patient educated about signs/symptoms and advised to contact clinic if hyperglycemia.    Follow-up tomorrow by phone Has an appointment on  09/01/18  Butch Penny Martel Galvan 4:24 PM 08/24/2018

## 2018-08-24 NOTE — Patient Instructions (Signed)
Thank you for allowing [place] to provide your care today. Today we discussed your COPD and chronic sinusitis.    I have ordered sinus CT to get imaging of your sinuses since your symptoms have not resolved or improved since hospital discharge.     Today we made changes to your medications.    Please take prednisone 20 mg two tablets per day in the morning for three days  I also refilled your ipratropium-albuterol duoneb.  You also have been provided with a sample of Trelegy.   Please follow-up in two weeks after your CT scan and to reassess your symptoms.     Should you have any questions or concerns please call the internal medicine clinic at [phone].

## 2018-08-25 ENCOUNTER — Encounter: Payer: Medicare Other | Admitting: Internal Medicine

## 2018-08-25 ENCOUNTER — Telehealth: Payer: Self-pay | Admitting: *Deleted

## 2018-08-25 DIAGNOSIS — I499 Cardiac arrhythmia, unspecified: Secondary | ICD-10-CM | POA: Insufficient documentation

## 2018-08-25 NOTE — Assessment & Plan Note (Signed)
Symptoms have improved since hospital discharge but she does continue to have some SOB and sputum production. No wheezing on PE. Discharged with five days of prednisone, which she completed yesterday.   - prednisone 40 mg 3 days - refilled ipratropium-albuterol duoneb

## 2018-08-25 NOTE — Telephone Encounter (Signed)
Call to [facility] for PA for Duoned through patient's Medicare Part D Plan.  Answered questions .  Determined that patient's Medicare Part B Plan will cover.  Call to [facility] on [address]-Claim was run and is covered for the Duoneb.  Sander Nephew, RN 08/25/2018 12:15 PM.

## 2018-08-25 NOTE — Assessment & Plan Note (Signed)
>>  ASSESSMENT AND PLAN FOR CHRONIC OBSTRUCTIVE PULMONARY DISEASE WITH BRONCHOSPASM ([place]) WRITTEN ON 08/25/2018  4:06 PM BY SEAWELL, JAIMIE A, DO  Symptoms have improved since hospital discharge but she does continue to have some SOB and sputum production. No wheezing on PE. Discharged with five days of prednisone, which she completed yesterday.   - prednisone 40 mg 3 days - refilled ipratropium-albuterol duoneb

## 2018-08-25 NOTE — Assessment & Plan Note (Addendum)
Discharged for acute bilateral maxillary sinusitis on 10/09 after treatment with unasyn and discharged with augmentin, completed on 10/15. She continues to have symptoms of upper airway congestion with drainage, described as clear. Denies sinus or ear pain but endorses sore throat and states she is starting to cough. She smokes half a pack per day. She states she has been having night sweats as well. Her symptoms were ongoing for several months before this as well, with doxycycline and prednisone given and then cipro with no resolution.    - sinus CT  - refer to ENT pending sinus CT  - f/u in two weeks

## 2018-08-29 ENCOUNTER — Ambulatory Visit (HOSPITAL_COMMUNITY): Payer: Medicare Other

## 2018-08-29 NOTE — Progress Notes (Signed)
Internal Medicine Clinic Attending  I saw and evaluated the patient.  I personally confirmed the key portions of the history and exam documented by Dr. Seawell and I reviewed pertinent patient test results.  The assessment, diagnosis, and plan were formulated together and I agree with the documentation in the resident's note.  Alexander Raines, M.D., Ph.D.  

## 2018-08-29 NOTE — Telephone Encounter (Signed)
Thank you :)

## 2018-09-01 ENCOUNTER — Encounter: Payer: Medicare Other | Admitting: Internal Medicine

## 2018-09-01 ENCOUNTER — Ambulatory Visit: Payer: Medicare Other | Admitting: Pharmacist

## 2018-09-01 ENCOUNTER — Encounter: Payer: Self-pay | Admitting: Internal Medicine

## 2018-09-03 ENCOUNTER — Emergency Department (HOSPITAL_COMMUNITY): Payer: Medicare Other

## 2018-09-03 ENCOUNTER — Other Ambulatory Visit: Payer: Self-pay

## 2018-09-03 ENCOUNTER — Encounter (HOSPITAL_COMMUNITY): Payer: Self-pay | Admitting: Emergency Medicine

## 2018-09-03 ENCOUNTER — Inpatient Hospital Stay (HOSPITAL_COMMUNITY)
Admission: EM | Admit: 2018-09-03 | Discharge: 2018-09-06 | DRG: 153 | Disposition: A | Discharge status: Home / Self Care | Payer: Medicare Other | Attending: Internal Medicine | Admitting: Internal Medicine

## 2018-09-03 DIAGNOSIS — Z853 Personal history of malignant neoplasm of breast: Secondary | ICD-10-CM

## 2018-09-03 DIAGNOSIS — J441 Chronic obstructive pulmonary disease with (acute) exacerbation: Secondary | ICD-10-CM | POA: Diagnosis present

## 2018-09-03 DIAGNOSIS — J310 Chronic rhinitis: Secondary | ICD-10-CM | POA: Diagnosis present

## 2018-09-03 DIAGNOSIS — J0100 Acute maxillary sinusitis, unspecified: Principal | ICD-10-CM | POA: Diagnosis present

## 2018-09-03 DIAGNOSIS — E1169 Type 2 diabetes mellitus with other specified complication: Secondary | ICD-10-CM | POA: Diagnosis present

## 2018-09-03 DIAGNOSIS — Z8701 Personal history of pneumonia (recurrent): Secondary | ICD-10-CM

## 2018-09-03 DIAGNOSIS — R918 Other nonspecific abnormal finding of lung field: Secondary | ICD-10-CM | POA: Diagnosis not present

## 2018-09-03 DIAGNOSIS — F411 Generalized anxiety disorder: Secondary | ICD-10-CM | POA: Diagnosis present

## 2018-09-03 DIAGNOSIS — Z7982 Long term (current) use of aspirin: Secondary | ICD-10-CM

## 2018-09-03 DIAGNOSIS — Z599 Problem related to housing and economic circumstances, unspecified: Secondary | ICD-10-CM

## 2018-09-03 DIAGNOSIS — I10 Essential (primary) hypertension: Secondary | ICD-10-CM | POA: Diagnosis present

## 2018-09-03 DIAGNOSIS — Z7984 Long term (current) use of oral hypoglycemic drugs: Secondary | ICD-10-CM

## 2018-09-03 DIAGNOSIS — E785 Hyperlipidemia, unspecified: Secondary | ICD-10-CM | POA: Diagnosis present

## 2018-09-03 DIAGNOSIS — I2510 Atherosclerotic heart disease of native coronary artery without angina pectoris: Secondary | ICD-10-CM | POA: Diagnosis present

## 2018-09-03 DIAGNOSIS — Z90710 Acquired absence of both cervix and uterus: Secondary | ICD-10-CM

## 2018-09-03 DIAGNOSIS — Z9221 Personal history of antineoplastic chemotherapy: Secondary | ICD-10-CM

## 2018-09-03 DIAGNOSIS — Z716 Tobacco abuse counseling: Secondary | ICD-10-CM

## 2018-09-03 DIAGNOSIS — K219 Gastro-esophageal reflux disease without esophagitis: Secondary | ICD-10-CM | POA: Diagnosis present

## 2018-09-03 DIAGNOSIS — F329 Major depressive disorder, single episode, unspecified: Secondary | ICD-10-CM | POA: Diagnosis present

## 2018-09-03 DIAGNOSIS — R0602 Shortness of breath: Secondary | ICD-10-CM | POA: Diagnosis not present

## 2018-09-03 DIAGNOSIS — Z981 Arthrodesis status: Secondary | ICD-10-CM

## 2018-09-03 DIAGNOSIS — Z7951 Long term (current) use of inhaled steroids: Secondary | ICD-10-CM

## 2018-09-03 DIAGNOSIS — F17210 Nicotine dependence, cigarettes, uncomplicated: Secondary | ICD-10-CM | POA: Diagnosis not present

## 2018-09-03 DIAGNOSIS — Z79899 Other long term (current) drug therapy: Secondary | ICD-10-CM

## 2018-09-03 DIAGNOSIS — Z888 Allergy status to other drugs, medicaments and biological substances status: Secondary | ICD-10-CM

## 2018-09-03 DIAGNOSIS — Z923 Personal history of irradiation: Secondary | ICD-10-CM

## 2018-09-03 LAB — BASIC METABOLIC PANEL
Anion gap: 8 (ref 5–15)
BUN: 12 mg/dL (ref 8–23)
CALCIUM: 8.5 mg/dL — AB (ref 8.9–10.3)
CHLORIDE: 112 mmol/L — AB (ref 98–111)
CO2: 19 mmol/L — ABNORMAL LOW (ref 22–32)
CREATININE: 0.7 mg/dL (ref 0.44–1.00)
GFR calc Af Amer: >60 mL/min (ref >60)
GFR calc non Af Amer: >60 mL/min (ref >60)
GLUCOSE: 120 mg/dL — AB (ref 70–99)
Potassium: 3.5 mmol/L (ref 3.5–5.1)
Sodium: 139 mmol/L (ref 135–145)

## 2018-09-03 LAB — CBC
HEMATOCRIT: 38.5 % (ref 36.0–46.0)
Hemoglobin: 12.6 g/dL (ref 12.0–15.0)
MCH: 30.4 pg (ref 26.0–34.0)
MCHC: 32.7 g/dL (ref 30.0–36.0)
MCV: 92.8 fL (ref 80.0–100.0)
PLATELETS: 236 10*3/uL (ref 150–400)
RBC: 4.15 MIL/uL (ref 3.87–5.11)
RDW: 12.5 % (ref 11.5–15.5)
WBC: 5.1 10*3/uL (ref 4.0–10.5)
nRBC: 0 % (ref 0.0–0.2)

## 2018-09-03 LAB — I-STAT TROPONIN, ED: Troponin i, poc: 0.02 ng/mL (ref 0.00–0.08)

## 2018-09-03 MED ORDER — INSULIN ASPART 100 UNIT/ML ~~LOC~~ SOLN
0.0000 [IU] | Freq: Three times a day (TID) | SUBCUTANEOUS | Status: DC
  Administered 2018-09-04 (×3): 5 [IU] via SUBCUTANEOUS
  Administered 2018-09-05: 2 [IU] via SUBCUTANEOUS
  Administered 2018-09-05: 1 [IU] via SUBCUTANEOUS
  Administered 2018-09-05 – 2018-09-06 (×2): 7 [IU] via SUBCUTANEOUS
  Administered 2018-09-06 (×2): 3 [IU] via SUBCUTANEOUS
  Filled 2018-09-03 (×5): qty 1

## 2018-09-03 MED ORDER — ALBUTEROL SULFATE (2.5 MG/3ML) 0.083% IN NEBU
2.5000 mg | INHALATION_SOLUTION | RESPIRATORY_TRACT | Status: DC | PRN
  Administered 2018-09-04 (×2): 2.5 mg via RESPIRATORY_TRACT
  Filled 2018-09-03 (×4): qty 3

## 2018-09-03 MED ORDER — HYDROXYZINE HCL 10 MG PO TABS
10.0000 mg | ORAL_TABLET | Freq: Three times a day (TID) | ORAL | Status: DC | PRN
  Administered 2018-09-04 – 2018-09-06 (×5): 10 mg via ORAL
  Filled 2018-09-03 (×9): qty 1

## 2018-09-03 MED ORDER — LOSARTAN POTASSIUM-HCTZ 100-25 MG PO TABS: 1.0000 | ORAL_TABLET | Freq: Every day | ORAL | Status: DC

## 2018-09-03 MED ORDER — POLYETHYLENE GLYCOL 3350 17 G PO PACK: 17.0000 g | PACK | Freq: Every day | ORAL | Status: DC | PRN

## 2018-09-03 MED ORDER — MAGNESIUM SULFATE 2 GM/50ML IV SOLN
2.0000 g | Freq: Once | INTRAVENOUS | Status: AC
  Administered 2018-09-03: 2 g via INTRAVENOUS
  Filled 2018-09-03: qty 50

## 2018-09-03 MED ORDER — SIMVASTATIN 40 MG PO TABS
40.0000 mg | ORAL_TABLET | Freq: Every day | ORAL | Status: DC
  Administered 2018-09-04 – 2018-09-06 (×3): 40 mg via ORAL
  Filled 2018-09-03 (×3): qty 1

## 2018-09-03 MED ORDER — DICLOFENAC SODIUM 1 % TD GEL
2.0000 g | Freq: Three times a day (TID) | TRANSDERMAL | Status: DC
  Administered 2018-09-04 – 2018-09-06 (×11): 2 g via TOPICAL
  Filled 2018-09-03: qty 100

## 2018-09-03 MED ORDER — PREDNISONE 20 MG PO TABS
40.0000 mg | ORAL_TABLET | Freq: Every day | ORAL | Status: DC
  Administered 2018-09-04 – 2018-09-06 (×3): 40 mg via ORAL
  Filled 2018-09-03 (×3): qty 2

## 2018-09-03 MED ORDER — IPRATROPIUM-ALBUTEROL 0.5-2.5 (3) MG/3ML IN SOLN
3.0000 mL | Freq: Once | RESPIRATORY_TRACT | Status: AC
  Administered 2018-09-03: 3 mL via RESPIRATORY_TRACT
  Filled 2018-09-03: qty 3

## 2018-09-03 MED ORDER — ASPIRIN EC 81 MG PO TBEC
81.0000 mg | DELAYED_RELEASE_TABLET | Freq: Every day | ORAL | Status: DC
  Administered 2018-09-04 – 2018-09-06 (×3): 81 mg via ORAL
  Filled 2018-09-03 (×3): qty 1

## 2018-09-03 MED ORDER — ACETAMINOPHEN 325 MG PO TABS
650.0000 mg | ORAL_TABLET | Freq: Four times a day (QID) | ORAL | Status: DC | PRN
  Administered 2018-09-04 (×2): 650 mg via ORAL
  Filled 2018-09-03 (×3): qty 2

## 2018-09-03 MED ORDER — ENOXAPARIN SODIUM 40 MG/0.4ML ~~LOC~~ SOLN
40.0000 mg | Freq: Every day | SUBCUTANEOUS | Status: DC
  Administered 2018-09-04 – 2018-09-06 (×3): 40 mg via SUBCUTANEOUS
  Filled 2018-09-03 (×3): qty 0.4

## 2018-09-03 MED ORDER — DEXAMETHASONE SODIUM PHOSPHATE 10 MG/ML IJ SOLN
10.0000 mg | Freq: Once | INTRAMUSCULAR | Status: AC
  Administered 2018-09-03: 10 mg via INTRAMUSCULAR
  Filled 2018-09-03: qty 1

## 2018-09-03 MED ORDER — HYDROXYZINE HCL 10 MG PO TABS: 10.0000 mg | ORAL_TABLET | Freq: Three times a day (TID) | ORAL | Status: DC | PRN

## 2018-09-03 MED ORDER — IPRATROPIUM-ALBUTEROL 0.5-2.5 (3) MG/3ML IN SOLN
3.0000 mL | Freq: Four times a day (QID) | RESPIRATORY_TRACT | Status: DC
  Administered 2018-09-04 – 2018-09-06 (×12): 3 mL via RESPIRATORY_TRACT
  Filled 2018-09-03 (×14): qty 3

## 2018-09-03 MED ORDER — GUAIFENESIN ER 600 MG PO TB12
600.0000 mg | ORAL_TABLET | Freq: Two times a day (BID) | ORAL | Status: DC
  Administered 2018-09-04 – 2018-09-06 (×7): 600 mg via ORAL
  Filled 2018-09-03 (×7): qty 1

## 2018-09-03 MED ORDER — FLUTICASONE-UMECLIDIN-VILANT 100-62.5-25 MCG/INH IN AEPB: 1.0000 | INHALATION_SPRAY | Freq: Every day | RESPIRATORY_TRACT | Status: DC

## 2018-09-03 MED ORDER — FLUTICASONE PROPIONATE 50 MCG/ACT NA SUSP
2.0000 | Freq: Every day | NASAL | Status: DC
  Administered 2018-09-06: 2 via NASAL
  Filled 2018-09-03: qty 16

## 2018-09-03 MED ORDER — ALBUTEROL (5 MG/ML) CONTINUOUS INHALATION SOLN
10.0000 mg/h | INHALATION_SOLUTION | Freq: Once | RESPIRATORY_TRACT | Status: AC
  Administered 2018-09-03: 10 mg/h via RESPIRATORY_TRACT
  Filled 2018-09-03: qty 20

## 2018-09-03 MED ORDER — ACETAMINOPHEN 650 MG RE SUPP: 650.0000 mg | Freq: Four times a day (QID) | RECTAL | Status: DC | PRN

## 2018-09-03 MED ORDER — LORAZEPAM 1 MG PO TABS
1.0000 mg | ORAL_TABLET | Freq: Once | ORAL | Status: AC
  Administered 2018-09-03: 1 mg via ORAL
  Filled 2018-09-03: qty 1

## 2018-09-03 MED ORDER — NICOTINE 14 MG/24HR TD PT24
14.0000 mg | MEDICATED_PATCH | Freq: Every day | TRANSDERMAL | Status: DC
  Filled 2018-09-03 (×4): qty 1

## 2018-09-03 NOTE — H&P (Signed)
Date: 09/03/2018               Patient Name:  [name] MRN: [id number]  DOB: 12-06-1950 Age / Sex: 67 y.o., female   PCP: Welford Roche, MD         Medical Service: Internal Medicine Teaching Service         Attending Physician: Dr. Aldine Contes, MD    First Contact: Dr. Laural Golden, Areeg Pager: [phone]  Second Contact: Dr. Kalman Shan Pager: [phone]       After Hours (After 5p/  First Contact Pager: [phone]  weekends / holidays): Second Contact Pager: [phone]   Chief Complaint: Shortness of breath  History of Present Illness:  [name] is a 67 yo F w/ PMH of recent sinusitis treated w/ abx, COPD, breast ca s/p lumpectomy, HTN, HLD, T2DM, GAD and tobacco use presenting with shortness of breath. She was examined and evaluated at beside in the ED. She was in her usual state of health until 3 months ago when she began to have significant sinus congestion, rhinitis, wheezing and shortness of breath. She states she was admitted for this issue at the time and she was treated with steroids, nebulizer treatments and antibiotics and discharged. Since then she had returned to the hospital multiple times because she felt that she was not getting any better and she mentions that she feels like she was discharged too early every time despite satting 100% on room air at the time of discharge. She states since the symptoms started, her shortness of breath and wheezing would minimally improve with steroid treatment but she hasn't had any significant improvement with her sinus congestion, rhinitis, and cough despite antibiotic treatment.   Since her last admission, she states she has been continuing to have clear drainage with tinge of green despite finishing her course of antibiotics and steroids and she has been continuing to have shortness of breath with wheezing. She states she also has concomitant night sweats but denies any recorded fever or chills. She states she  also has been having worsening orthopnea 2/2 her post-nasal drip and she notices intermittent lower extremity edema. She mentions that all of these respiratory symptoms make her very anxious and she often has anxiety attacks with palpitations. She also has been using her nebulizer treatments every 2 hours. She states she has been using her maintenance inhalers, Trelegy, every day and she denies any mold or indoor pets at home. She also mentions she regularly takes her Zyrtec as needed. She mentions a history of significant dust exposure when she used to work at a [employer] 10 years ago but currently she is retired. She continues to smoke half a pack a day everyday. She denies any N/V/D/C.  Chart review shows that she had a similar hospitalizations for COPD exacerbations around this time of year in both 2017 and 2016. Both times treated and improved with antibiotic therapy and steroids.  In the ED, she was given dexamethason injection and given 2 duoneb treatments. She was also given 2g of IV mag. Internal medicine was consulted for admission for COPD exacerbation.  Meds:  Current Meds  Medication Sig  . acetaminophen (TYLENOL) 500 MG tablet Take 1,000 mg by mouth every 6 (six) hours as needed for headache (pain).  Marland Kitchen albuterol (PROVENTIL HFA;VENTOLIN HFA) 108 (90 Base) MCG/ACT inhaler Inhale 2 puffs into the lungs every 6 (six) hours as needed for wheezing or shortness of breath.  . diclofenac sodium (VOLTAREN) 1 %  GEL Apply 2 g topically 2 (two) times daily as needed. (Patient taking differently: Apply 2 g topically 2 (two) times daily as needed (knee pain). )  . Fluticasone-Umeclidin-Vilant (TRELEGY ELLIPTA) 100-62.5-25 MCG/INH AEPB Inhale 1 puff into the lungs daily.   . hydrOXYzine (ATARAX/VISTARIL) 10 MG tablet Take 1 tablet (10 mg total) by mouth 3 (three) times daily as needed for itching or anxiety.  Marland Kitchen ipratropium-albuterol (DUONEB) 0.5-2.5 (3) MG/3ML SOLN Take 3 mLs by nebulization every  6 (six) hours as needed. (Patient taking differently: Take 3 mLs by nebulization every 6 (six) hours as needed (shortness of breath/wheezing). )  . levocetirizine (XYZAL) 5 MG tablet Take 2 x a day for the itching (Patient taking differently: Take 5 mg by mouth 2 (two) times daily as needed (itching). )  . losartan-hydrochlorothiazide (HYZAAR) 100-25 MG tablet TAKE 1 TABLET BY MOUTH EVERY DAY (Patient taking differently: Take 1 tablet by mouth daily. )  . metFORMIN (GLUCOPHAGE) 1000 MG tablet TAKE 1 TABLET BY MOUTH TWICE DAILY (Patient taking differently: Take 1,000 mg by mouth 2 (two) times daily with a meal. )  . metoprolol tartrate (LOPRESSOR) 25 MG tablet Take 1 tablet (25 mg total) by mouth 2 (two) times daily.  Marland Kitchen RA ASPIRIN EC ADULT LOW ST 81 MG EC tablet take 1 tablet by mouth once daily (Patient taking differently: Take 81 mg by mouth daily. )  . simvastatin (ZOCOR) 40 MG tablet Take 1 tablet (40 mg total) by mouth daily at 6 PM.   Allergies: Allergies as of 09/03/2018 - Review Complete 09/03/2018  Allergen Reaction Noted  . Ace inhibitors Swelling 12/26/2014  . Flonase [fluticasone] Other (See Comments) 12/25/2014   Past Medical History:  Diagnosis Date  . Anxiety   . Arthritis    "fingers, knees" (08/16/2018)  . Asthma   . Cancer of right breast ([place]) 1991   s/p lumpectomy, chemotherapy and radiation therapy in 1991. Mammogram in 2007 was normal.  . Constipated    h/o  . COPD (chronic obstructive pulmonary disease) ([place])    History of multiple hospital admissions for exercabation   . COPD with exacerbation ([place]) 04/06/2009   Qualifier: Diagnosis of  By: Eyvonne Mechanic MD, Vijay    . Depression   . Diarrhea    h/o  . GERD (gastroesophageal reflux disease)   . Headache    "a few times/month" (08/16/2018  . Heart murmur 10/05/11   "first time I ever heard I had one was today"  . Hyperlipidemia   . Hypertension   . Obesity   . Personal history of chemotherapy   . Personal history  of radiation therapy   . Pneumonia    "couple times in the last 10-15 yrs" (08/16/2018)  . Shortness of breath 10/05/11   "at rest; lying down; w/exertion"  . Sigmoid diverticulitis 80/2008  . Tobacco abuse   . Type II diabetes mellitus (HCC)     Family History:  Family History  Problem Relation Age of Onset  . Cancer Mother   Breast and uterine cancer in mother  Social History:  Used to work at [employer]. Currently retired and living with husband. Has an outdoor dog. 50 pack year smoking history, currently smoking half a pack a day. Drinks occasionally during social events. Denies any illicit substance use.   Review of Systems: A complete ROS was negative except as per HPI.   Physical Exam: Blood pressure (!) 178/80, pulse (!) 112, temperature 98.3 F (36.8 C), temperature source Oral,  resp. rate (!) 25, height 5\' 6"  (1.676 m), weight 86 kg, SpO2 94 %. Physical Exam  Constitutional: She is oriented to person, place, and time. She appears distressed.  Tearful while speaking  HENT:  Head: Normocephalic.  Mouth/Throat: Oropharynx is clear and moist. No oropharyngeal exudate.  Swollen nasal turbinate with erythema. More edematous L>R. No significant maxillary tenderness to palpation.  Eyes: Pupils are equal, round, and reactive to light. EOM are normal.  Injected conjunctivae, tearful  Neck: Normal range of motion. Neck supple. No JVD present.  Cardiovascular: Regular rhythm, normal heart sounds and intact distal pulses.  No murmur heard. tachycardic  Pulmonary/Chest: She has wheezes (expiratory wheezes on bilateral lobes). She exhibits tenderness (right sided flank pain with cough).  Transmitted upper airway noises  Abdominal: Soft. Bowel sounds are normal. She exhibits distension. There is no tenderness.  Musculoskeletal: Normal range of motion. She exhibits no edema.  Lymphadenopathy:    She has no cervical adenopathy.  Neurological: She is alert and oriented to  person, place, and time. No cranial nerve deficit. GCS score is 15.  Resting tremors  Skin: Skin is warm and dry. She is not diaphoretic.   EKG: personally reviewed my interpretation is sinus tachycardia, normal axis, +PVCs, no ST elevation or depression. No significant change from prior.   CXR: personally reviewed my interpretation is normal mediastinum, no lobar consolidation, no vascular congestion, no pleural effusion, hyperinflated lungs with flattened diaphragm, thickened bronchiole outlines.  Assessment & Plan by Problem: Mrs.Sequeira is a 67 yo F w/ PMH of recent sinusitis treated w/ abx, COPD, breast ca s/p lumpectomy, HTN, HLD, T2DM, GAD and tobacco use. This is her 3rd hospitalization in the last 3 months for shortness of breath. She is having significant rhinitis, sinus congestion and cough with wheezing and dyspnea. Chart review shows a seasonal pattern of COPD exacerbations and likely that these string of episodes are also exacerbated in part by seasonal allergies. Also her treatment course has been complicated by her inability to give up tobacco despite multiple counseling sessions and nicotine patches provided in the past. She also cannot afford her outpatient inhaler and has been living off samples which makes her compliance with her daily inhaler questionable. There were evidence of sinusitis in her prior admission and she continues to have similar symptoms but no obvious indication for abx treatment at this time since she has no purulent cough, significant maxillary tenderness or leukocytosis. She will be admitted for COPD exacerbation on observation with nebulizer treatments and steroids.  Shortness of breath and wheezing 2/2 COPD exacerbation Hx of significant COPD with tobacco use. Last PFT in 2016 with obstructive lung disease w/o significant improvement w/ bronchodilators consistent with COPD (FEV1/FVC 57%, FEV1 68% of predict at 1.48L, FEF 25-75% 26% of predict at 0.54L/sec, FVC  wnl) On Trelegy at home. No home oxygen requirement. Rescue inhaler every 2 hr during last week. Chest X-ray show no evidence of pneumonia. - O2 monitoring: Keep O2 sat >88 - Prednisone 40mg  daily - Glucose checks & SSI in setting or steroid use - Mucinex 600mg  bid - Proventil nebulizer q4hr for wheezing and dyspnea - C/w home med: Trelegy 1 puff daily  Rhinitis and swollen turbinates 2/2 bacterial vs allergic sinusitis Paranasal sinus x-ray read as bilateral air-fluid level consistent w/ acute maxillary sinus (10/9). Swollen turbinates on exam. No recorded fever or chills. WBC 5.1 despite steroid therapy. Chart review shows seasonal exacerbations. - No abx therapy indicated at this time - Holding sudafed  for now 2/2 tachycardia - Hydroxyzine 10mg  TID PRN for rhinorrhea - C/w home meds: Flonase nasal spray daily  HTN - Holding metoprolol for now as it may exacerbate COPD - C/w home med: losartan-HCTZ 100-25mg  daily  Hx of tobacco use - Nicotine patch 14mg   HLD - c/w home med: simvastatin 40mg  daily  CAD - c/w home med: aspirin 81mg  daily  DVT prophx: Lovenox Diet: Cardiac, diabetic diet Bowel: Miralax Code: Full  Dispo: Admit patient to Observation with expected length of stay less than 2 midnights.  Signed: Mosetta Anis, MD 09/03/2018, 10:37 PM  Pager: [phone]

## 2018-09-03 NOTE — ED Provider Notes (Signed)
[place] EMERGENCY DEPARTMENT Provider Note   CSN: [id number] Arrival date & time: 09/03/18  1405     History   Chief Complaint Chief Complaint  Patient presents with  . Shortness of Breath  . Facial Pain    HPI [name] is a 67 y.o. female who presents with SOB and a sinus infection. PMH significant for COPD, hx of pneumonia, hx of breast cancer, HTN, HLD, Type 2 DM. She states that she has been sick for about three months. She feels like she keeps "getting passed around". She has been on multiple rounds of antibiotics for a sinus infection including doxy, cipro, augmentin. She follows with the [facility] and was admitted from 10/9-10/10 for COPD exacerbation. She continues to smoke cigarettes. She continues to have a productive cough, facial pain, nasal congestion, SOB and wheezing. No chest pain but her ribs hurt from coughing.   HPI  Past Medical History:  Diagnosis Date  . Anxiety   . Arthritis    "fingers, knees" (08/16/2018)  . Asthma   . Bronchitis    h/o  . Cancer of right breast ([place]) 1991   s/p lumpectomy, chemotherapy and radiation therapy in 1991. Mammogram in 2007 was normal.  . CAP (community acquired pneumonia) 08/16/2018  . Constipated    h/o  . COPD (chronic obstructive pulmonary disease) ([place])    History of multiple hospital admissions for exercabation   . COPD with exacerbation ([place]) 04/06/2009   Qualifier: Diagnosis of  By: Eyvonne Mechanic MD, Vijay    . Depression   . Diarrhea    h/o  . GERD (gastroesophageal reflux disease)   . Headache    "a few times/month" (08/16/2018  . Heart murmur 10/05/11   "first time I ever heard I had one was today"  . Hyperlipidemia   . Hypertension   . Obesity   . Personal history of chemotherapy   . Personal history of radiation therapy   . Pneumonia    "couple times in the last 10-15 yrs" (08/16/2018)  . Shortness of breath 10/05/11   "at rest; lying down; w/exertion"  . Sigmoid diverticulitis  80/2008  . Tobacco abuse   . Type II diabetes mellitus [facility])     Patient Active Problem List   Diagnosis Date Noted  . Irregular cardiac rhythm 08/25/2018  . Acute recurrent sinusitis   . Alopecia of scalp 07/25/2018  . Bruising 06/21/2018  . Weight loss 06/21/2018  . Itching 06/21/2018  . Atopic dermatitis 03/20/2018  . Seasonal allergies 02/25/2017  . Right knee pain 05/23/2015  . Chronic obstructive airway disease with asthma ([place]) 12/24/2014  . QT prolongation 08/08/2014  . Dyslipidemia associated with type 2 diabetes mellitus ([place]) 06/14/2014  . History of breast cancer 12/01/2012  . Primary osteoarthritis of right knee 09/29/2012  . GAD (generalized anxiety disorder) 12/30/2009  . Type 2 diabetes mellitus ([place]) 05/14/2009  . TOBACCO ABUSE 04/06/2009  . Hypertension, benign essential, goal below 140/90 04/06/2009    Past Surgical History:  Procedure Laterality Date  . ABDOMINAL HYSTERECTOMY    . ANTERIOR CERVICAL DECOMP/DISCECTOMY FUSION  2012   "Dr. Lynann Bologna  put plate in; did something to my vertebrae"  . BACK SURGERY    . BREAST LUMPECTOMY Right 1991  . DOBUTAMINE STRESS ECHO  08/2004   Inferior ischemia, normal LV systolic function, no significant CAD     OB History   None      Home Medications    Prior to Admission  medications   Medication Sig Start Date End Date Taking? Authorizing Provider  ACCU-CHEK FASTCLIX LANCETS MISC 1 each by Other route See admin instructions. Check blood sugar daily as needed for high blood sugar.    [provider]  acetaminophen (TYLENOL) 325 MG tablet Take 650 mg by mouth 2 (two) times daily as needed for headache.    [provider]  albuterol (PROVENTIL HFA;VENTOLIN HFA) 108 (90 Base) MCG/ACT inhaler Inhale 2 puffs into the lungs every 6 (six) hours as needed for wheezing or shortness of breath. 08/10/18   Forde Dandy, PharmD  benzonatate (TESSALON) 100 MG capsule Take 1 capsule (100 mg total) by mouth  every 8 (eight) hours. 08/08/18   Maczis, Barth Kirks, PA-C  Blood Glucose Monitoring Suppl (ACCU-CHEK NANO SMARTVIEW) W/DEVICE KIT 1 each by Other route See admin instructions. Check blood sugar daily as needed for high blood sugar.    [provider]  buPROPion (WELLBUTRIN XL) 300 MG 24 hr tablet Take 1 tablet (300 mg total) by mouth daily. 08/08/18   Maczis, Barth Kirks, PA-C  cetirizine (ZYRTEC) 10 MG tablet Take 1 tablet (10 mg total) by mouth daily. 08/08/18   Maczis, Barth Kirks, PA-C  diclofenac sodium (VOLTAREN) 1 % GEL Apply 2 g topically 2 (two) times daily as needed. 06/21/18   Welford Roche, MD  Fluticasone-Umeclidin-Vilant (TRELEGY ELLIPTA) 100-62.5-25 MCG/INH AEPB Inhale into the lungs.    [provider]  glucose blood (ACCU-CHEK SMARTVIEW) test strip Use to check blood sugar 1 to 2 times daily. diag code E 11.9. Non- insulin dependent 10/02/14   Sid Falcon, MD  Guaifenesin [facility] MAXIMUM STRENGTH) 1200 MG TB12 Take 1 tablet (1,200 mg total) by mouth every 12 (twelve) hours as needed. 08/08/18   Maczis, Barth Kirks, PA-C  hydrOXYzine (ATARAX/VISTARIL) 10 MG tablet Take 1 tablet (10 mg total) by mouth 3 (three) times daily as needed for itching or anxiety. 04/27/18   Lenore Cordia, MD  ipratropium-albuterol (DUONEB) 0.5-2.5 (3) MG/3ML SOLN Take 3 mLs by nebulization every 6 (six) hours as needed. 08/24/18   Seawell, Andris Baumann A, DO  levocetirizine (XYZAL) 5 MG tablet Take 2 x a day for the itching 04/09/18   Raylene Everts, MD  losartan-hydrochlorothiazide [facility]) 100-25 MG tablet TAKE 1 TABLET BY MOUTH EVERY DAY 03/10/18   Welford Roche, MD  metFORMIN (GLUCOPHAGE) 1000 MG tablet TAKE 1 TABLET BY MOUTH TWICE DAILY 03/10/18   Welford Roche, MD  metoprolol tartrate (LOPRESSOR) 25 MG tablet Take 1 tablet (25 mg total) by mouth 2 (two) times daily. 12/28/17   Shela Leff, MD  pseudoephedrine (SUDAFED) 120 MG 12 hr tablet Take 1 tablet (120 mg total)  by mouth 2 (two) times daily as needed for congestion. 08/09/18 08/09/19  Seawell, Jaimie A, DO  RA ASPIRIN EC ADULT LOW ST 81 MG EC tablet take 1 tablet by mouth once daily 01/04/17   Asencion Partridge, MD  silver sulfADIAZINE (SILVADENE) 1 % cream Apply 1 application topically daily. 03/16/18   Collier Salina, MD  simvastatin (ZOCOR) 40 MG tablet Take 1 tablet (40 mg total) by mouth daily at 6 PM. 03/16/18   Rice, Resa Miner, MD  triamcinolone (NASACORT) 55 MCG/ACT AERO nasal inhaler Place 1 spray into the nose daily. 08/17/18   Dorrell, Andree Elk, MD    Family History Family History  Problem Relation Age of Onset  . Cancer Mother     Social History Social History   Tobacco Use  .  Smoking status: Current Every Day Smoker    Packs/day: 0.50    Years: 45.00    Pack years: 22.50    Types: Cigarettes  . Smokeless tobacco: Never Used  Substance Use Topics  . Alcohol use: Yes    Alcohol/week: 4.0 standard drinks    Types: 4 Cans of beer per week    Comment: 08/16/2018 "weekends only"  . Drug use: No     Allergies   Ace inhibitors and Flonase [fluticasone]   Review of Systems Review of Systems  Constitutional: Negative for fever.  HENT: Positive for congestion.   Respiratory: Positive for cough, shortness of breath and wheezing.   Cardiovascular: Negative for chest pain.  Gastrointestinal: Negative for nausea and vomiting.  Neurological: Negative for syncope.     Physical Exam Updated Vital Signs BP (!) 166/100 (BP Location: Right Arm)   Pulse (!) 113   Temp 98.3 F (36.8 C) (Oral)   Resp (!) 21   Ht 5' 6" (1.676 m)   Wt 86 kg   SpO2 100%   BMI 30.60 kg/m   Physical Exam  Constitutional: She is oriented to person, place, and time. She appears well-developed and well-nourished. She appears distressed.  Crying, anxious, feels SOB  HENT:  Head: Normocephalic and atraumatic.  Eyes: Pupils are equal, round, and reactive to light. Conjunctivae are normal. Right eye  exhibits no discharge. Left eye exhibits no discharge. No scleral icterus.  Neck: Normal range of motion.  Cardiovascular: Tachycardia present. Exam reveals no gallop and no friction rub.  No murmur heard. Pulmonary/Chest: Effort normal. No respiratory distress. She has decreased breath sounds. She has wheezes (diffuse expiratory wheezes).  Abdominal: She exhibits no distension.  Musculoskeletal:  No peripheral edema  Neurological: She is alert and oriented to person, place, and time.  Skin: Skin is warm and dry.  Psychiatric: She has a normal mood and affect. Her behavior is normal.  Nursing note and vitals reviewed.    ED Treatments / Results  Labs (all labs ordered are listed, but only abnormal results are displayed) Labs Reviewed  BASIC METABOLIC PANEL - Abnormal; Notable for the following components:      Result Value   Chloride 112 (*)    CO2 19 (*)    Glucose, Bld 120 (*)    Calcium 8.5 (*)    All other components within normal limits  CBC  BASIC METABOLIC PANEL  I-STAT TROPONIN, ED    EKG None  Radiology Dg Chest 2 View  Result Date: 09/03/2018 CLINICAL DATA:  Pt. Stated, I can't breath and SOB for 3 months off and on EXAM: CHEST - 2 VIEW COMPARISON:  08/16/2018 FINDINGS: Lungs are hyperinflated. There is perihilar peribronchial thickening. Heart size is normal. There are no focal consolidations. No pleural effusions or pulmonary edema. There is asymmetry of the breast shadows, RIGHT smaller than the LEFT. Surgical clips are identified in the RIGHT axillary region. Remote LOWER cervical fusion. IMPRESSION: Hyperinflation and bronchitic changes. No focal acute pulmonary abnormality. Electronically Signed   By: Nolon Nations M.D.   On: 09/03/2018 15:58    Procedures Procedures (including critical care time)  CRITICAL CARE Performed by: Recardo Evangelist   Total critical care time: 35 minutes  Critical care time was exclusive of separately billable procedures  and treating other patients.  Critical care was necessary to treat or prevent imminent or life-threatening deterioration.  Critical care was time spent personally by me on the following activities: development of treatment  plan with patient and/or surrogate as well as nursing, discussions with consultants, evaluation of patient's response to treatment, examination of patient, obtaining history from patient or surrogate, ordering and performing treatments and interventions, ordering and review of laboratory studies, ordering and review of radiographic studies, pulse oximetry and re-evaluation of patient's condition.   Medications Ordered in ED Medications  predniSONE (DELTASONE) tablet 40 mg (has no administration in time range)  guaiFENesin (MUCINEX) 12 hr tablet 600 mg (has no administration in time range)  nicotine (NICODERM CQ - dosed in mg/24 hours) patch 14 mg (has no administration in time range)  ipratropium-albuterol (DUONEB) 0.5-2.5 (3) MG/3ML nebulizer solution 3 mL (has no administration in time range)  albuterol (PROVENTIL) (2.5 MG/3ML) 0.083% nebulizer solution 2.5 mg (has no administration in time range)  fluticasone (FLONASE) 50 MCG/ACT nasal spray 2 spray (has no administration in time range)  hydrOXYzine (ATARAX/VISTARIL) tablet 10 mg (has no administration in time range)  diclofenac sodium (VOLTAREN) 1 % transdermal gel 2 g (has no administration in time range)  ipratropium-albuterol (DUONEB) 0.5-2.5 (3) MG/3ML nebulizer solution 3 mL (3 mLs Nebulization Given 09/03/18 1445)  ipratropium-albuterol (DUONEB) 0.5-2.5 (3) MG/3ML nebulizer solution 3 mL (3 mLs Nebulization Given 09/03/18 1720)  dexamethasone (DECADRON) injection 10 mg (10 mg Intramuscular Given 09/03/18 1720)  LORazepam (ATIVAN) tablet 1 mg (1 mg Oral Given 09/03/18 1720)  albuterol (PROVENTIL,VENTOLIN) solution continuous neb (10 mg/hr Nebulization Given 09/03/18 1846)  magnesium sulfate IVPB 2 g 50 mL (0 g  Intravenous Stopped 09/03/18 1936)     Initial Impression / Assessment and Plan / ED Course  I have reviewed the triage vital signs and the nursing notes.  Pertinent labs & imaging results that were available during my care of the patient were reviewed by me and considered in my medical decision making (see chart for details).  67 year old female presents with SOB and sinusitis which has been refractory to medical management for the past couple months. She is hypertensive and mildly tachycardic. She is also somewhat tachypneic. She has diffuse wheezing on exam and is somewhat distressed and crying. I think she is having a mild-moderate COPD exacerbation which is worsened Shared visit with Dr. Roderic Palau.  6:14 PM Pt reports her anxiety is better but her breathing is not. She is still diffusely wheezing on exam. Will order CAT and magnesium.  On reevaluation she still does not feel better. Wheezing on exam has improved some. Will consult IM team for admission.  Final Clinical Impressions(s) / ED Diagnoses   Final diagnoses:  COPD exacerbation [facility])    ED Discharge Orders    None       Iris Pert 09/03/18 2158    Milton Ferguson, MD 09/03/18 2205

## 2018-09-03 NOTE — ED Notes (Signed)
Admitting team at bedside.

## 2018-09-03 NOTE — ED Triage Notes (Signed)
Pt. Stated, I can't breath and SOB for 3 months off and on

## 2018-09-03 NOTE — ED Notes (Signed)
Pt ambulatory to restroom, snack pack given

## 2018-09-03 NOTE — ED Notes (Addendum)
Patients O2 was at 98%-100% Room Air while ambulating.

## 2018-09-04 ENCOUNTER — Encounter (HOSPITAL_COMMUNITY): Payer: Self-pay | Admitting: General Practice

## 2018-09-04 ENCOUNTER — Observation Stay (HOSPITAL_COMMUNITY): Payer: Medicare Other

## 2018-09-04 DIAGNOSIS — J441 Chronic obstructive pulmonary disease with (acute) exacerbation: Secondary | ICD-10-CM | POA: Diagnosis not present

## 2018-09-04 DIAGNOSIS — R251 Tremor, unspecified: Secondary | ICD-10-CM | POA: Diagnosis not present

## 2018-09-04 DIAGNOSIS — E785 Hyperlipidemia, unspecified: Secondary | ICD-10-CM

## 2018-09-04 DIAGNOSIS — Z7982 Long term (current) use of aspirin: Secondary | ICD-10-CM

## 2018-09-04 DIAGNOSIS — J0101 Acute recurrent maxillary sinusitis: Secondary | ICD-10-CM | POA: Diagnosis not present

## 2018-09-04 DIAGNOSIS — F17210 Nicotine dependence, cigarettes, uncomplicated: Secondary | ICD-10-CM

## 2018-09-04 DIAGNOSIS — Z888 Allergy status to other drugs, medicaments and biological substances status: Secondary | ICD-10-CM

## 2018-09-04 DIAGNOSIS — Z853 Personal history of malignant neoplasm of breast: Secondary | ICD-10-CM

## 2018-09-04 DIAGNOSIS — F411 Generalized anxiety disorder: Secondary | ICD-10-CM

## 2018-09-04 DIAGNOSIS — E119 Type 2 diabetes mellitus without complications: Secondary | ICD-10-CM

## 2018-09-04 DIAGNOSIS — Z79899 Other long term (current) drug therapy: Secondary | ICD-10-CM

## 2018-09-04 DIAGNOSIS — I10 Essential (primary) hypertension: Secondary | ICD-10-CM

## 2018-09-04 DIAGNOSIS — I2510 Atherosclerotic heart disease of native coronary artery without angina pectoris: Secondary | ICD-10-CM | POA: Diagnosis not present

## 2018-09-04 DIAGNOSIS — Z7952 Long term (current) use of systemic steroids: Secondary | ICD-10-CM

## 2018-09-04 DIAGNOSIS — J019 Acute sinusitis, unspecified: Secondary | ICD-10-CM | POA: Diagnosis not present

## 2018-09-04 LAB — CBG MONITORING, ED
Glucose-Capillary: 245 mg/dL — ABNORMAL HIGH (ref 70–99)
Glucose-Capillary: 299 mg/dL — ABNORMAL HIGH (ref 70–99)

## 2018-09-04 LAB — BASIC METABOLIC PANEL
ANION GAP: 15 (ref 5–15)
BUN: 9 mg/dL (ref 8–23)
CHLORIDE: 100 mmol/L (ref 98–111)
CO2: 18 mmol/L — AB (ref 22–32)
Calcium: 8.9 mg/dL (ref 8.9–10.3)
Creatinine, Ser: 0.98 mg/dL (ref 0.44–1.00)
GFR calc non Af Amer: 58 mL/min — ABNORMAL LOW (ref >60)
Glucose, Bld: 314 mg/dL — ABNORMAL HIGH (ref 70–99)
Potassium: 3.6 mmol/L (ref 3.5–5.1)
Sodium: 133 mmol/L — ABNORMAL LOW (ref 135–145)

## 2018-09-04 LAB — GLUCOSE, CAPILLARY
GLUCOSE-CAPILLARY: 269 mg/dL — AB (ref 70–99)
GLUCOSE-CAPILLARY: 272 mg/dL — AB (ref 70–99)
GLUCOSE-CAPILLARY: 217 mg/dL — AB (ref 70–99)

## 2018-09-04 MED ORDER — HYDROCHLOROTHIAZIDE 25 MG PO TABS
25.0000 mg | ORAL_TABLET | Freq: Every day | ORAL | Status: DC
  Administered 2018-09-04 – 2018-09-06 (×3): 25 mg via ORAL
  Filled 2018-09-04 (×3): qty 1

## 2018-09-04 MED ORDER — GUAIFENESIN-DM 100-10 MG/5ML PO SYRP
5.0000 mL | ORAL_SOLUTION | ORAL | Status: DC | PRN
  Administered 2018-09-05 – 2018-09-06 (×4): 5 mL via ORAL
  Filled 2018-09-04 (×4): qty 5

## 2018-09-04 MED ORDER — FLUTICASONE FUROATE-VILANTEROL 100-25 MCG/INH IN AEPB
1.0000 | INHALATION_SPRAY | Freq: Every day | RESPIRATORY_TRACT | Status: DC
  Administered 2018-09-05 – 2018-09-06 (×2): 1 via RESPIRATORY_TRACT
  Filled 2018-09-04: qty 28

## 2018-09-04 MED ORDER — LIDOCAINE 5 % EX PTCH
1.0000 | MEDICATED_PATCH | CUTANEOUS | Status: DC
  Administered 2018-09-04 – 2018-09-06 (×3): 1 via TRANSDERMAL
  Filled 2018-09-04 (×4): qty 1

## 2018-09-04 MED ORDER — LOSARTAN POTASSIUM 50 MG PO TABS
100.0000 mg | ORAL_TABLET | Freq: Every day | ORAL | Status: DC
  Administered 2018-09-04 – 2018-09-06 (×3): 100 mg via ORAL
  Filled 2018-09-04 (×4): qty 2

## 2018-09-04 MED ORDER — UMECLIDINIUM BROMIDE 62.5 MCG/INH IN AEPB
1.0000 | INHALATION_SPRAY | Freq: Every day | RESPIRATORY_TRACT | Status: DC
  Administered 2018-09-05 – 2018-09-06 (×2): 1 via RESPIRATORY_TRACT
  Filled 2018-09-04: qty 7

## 2018-09-04 MED ORDER — LORAZEPAM 1 MG PO TABS
1.0000 mg | ORAL_TABLET | Freq: Once | ORAL | Status: AC | PRN
  Administered 2018-09-04: 1 mg via ORAL
  Filled 2018-09-04: qty 1

## 2018-09-04 MED ORDER — SODIUM CHLORIDE 0.9 % IV SOLN
3.0000 g | Freq: Four times a day (QID) | INTRAVENOUS | Status: DC
  Administered 2018-09-04 – 2018-09-05 (×4): 3 g via INTRAVENOUS
  Filled 2018-09-04 (×5): qty 3

## 2018-09-04 NOTE — Discharge Summary (Signed)
Name: [name] MRN: [id number] DOB: 1950/12/12 67 y.o. PCP: Welford Roche, MD  Date of Admission: 09/03/2018  2:06 PM Date of Discharge: 09/06/2018 Attending Physician: Aldine Contes  Discharge Diagnosis: 1. Acute sinusitis 2. COPD 3. Anxiety  Discharge Medications: Allergies as of 09/06/2018      Reactions   Ace Inhibitors Swelling   Throat swelling. Lisinopril-HCTZ   Flonase [fluticasone] Other (See Comments)   Sinuses stop up and condition worsens      Medication List    TAKE these medications   ACCU-CHEK FASTCLIX LANCETS Misc 1 each by Other route See admin instructions. Check blood sugar daily as needed for high blood sugar.   ACCU-CHEK NANO SMARTVIEW w/Device Kit 1 each by Other route See admin instructions. Check blood sugar daily as needed for high blood sugar.   acetaminophen 500 MG tablet Commonly known as:  TYLENOL Take 1,000 mg by mouth every 6 (six) hours as needed for headache (pain).   albuterol 108 (90 Base) MCG/ACT inhaler Commonly known as:  PROVENTIL HFA;VENTOLIN HFA Inhale 2 puffs into the lungs every 6 (six) hours as needed for wheezing or shortness of breath.   amoxicillin-clavulanate 875-125 MG tablet Commonly known as:  AUGMENTIN Take 1 tablet by mouth every 12 (twelve) hours for 4 doses.   benzonatate 100 MG capsule Commonly known as:  TESSALON Take 1 capsule (100 mg total) by mouth every 8 (eight) hours.   buPROPion 300 MG 24 hr tablet Commonly known as:  WELLBUTRIN XL Take 1 tablet (300 mg total) by mouth daily.   cetirizine 10 MG tablet Commonly known as:  ZYRTEC Take 1 tablet (10 mg total) by mouth daily.   chlorpheniramine-HYDROcodone 10-8 MG/5ML Suer Commonly known as:  TUSSIONEX Take 5 mLs by mouth every 12 (twelve) hours.   diclofenac sodium 1 % Gel Commonly known as:  VOLTAREN Apply 2 g topically 2 (two) times daily as needed. What changed:  reasons to take this   glucose blood test strip Use  to check blood sugar 1 to 2 times daily. diag code E 11.9. Non- insulin dependent   Guaifenesin 1200 MG Tb12 Take 1 tablet (1,200 mg total) by mouth every 12 (twelve) hours as needed.   hydrOXYzine 10 MG tablet Commonly known as:  ATARAX/VISTARIL Take 1 tablet (10 mg total) by mouth 3 (three) times daily as needed for itching or anxiety.   ipratropium-albuterol 0.5-2.5 (3) MG/3ML Soln Commonly known as:  DUONEB Take 3 mLs by nebulization every 6 (six) hours as needed. What changed:  reasons to take this   levocetirizine 5 MG tablet Commonly known as:  XYZAL Take 2 x a day for the itching What changed:    how much to take  how to take this  when to take this  reasons to take this  additional instructions   losartan-hydrochlorothiazide 100-25 MG tablet Commonly known as:  HYZAAR TAKE 1 TABLET BY MOUTH EVERY DAY   metFORMIN 1000 MG tablet Commonly known as:  GLUCOPHAGE TAKE 1 TABLET BY MOUTH TWICE DAILY What changed:  when to take this   metoprolol tartrate 25 MG tablet Commonly known as:  LOPRESSOR Take 1 tablet (25 mg total) by mouth 2 (two) times daily.   predniSONE 20 MG tablet Commonly known as:  DELTASONE Take 2 tablets (40 mg total) by mouth daily with breakfast.   pseudoephedrine 120 MG 12 hr tablet Commonly known as:  SUDAFED Take 1 tablet (120 mg total) by mouth 2 (two) times daily  as needed for congestion.   RA ASPIRIN EC ADULT LOW ST 81 MG EC tablet Generic drug:  aspirin take 1 tablet by mouth once daily What changed:  how much to take   silver sulfADIAZINE 1 % cream Commonly known as:  SILVADENE Apply 1 application topically daily.   simvastatin 40 MG tablet Commonly known as:  ZOCOR Take 1 tablet (40 mg total) by mouth daily at 6 PM.   TRELEGY ELLIPTA 100-62.5-25 MCG/INH Aepb Generic drug:  Fluticasone-Umeclidin-Vilant Inhale 1 puff into the lungs daily.   triamcinolone 55 MCG/ACT Aero nasal inhaler Commonly known as:  NASACORT Place 1  spray into the nose daily.       Disposition and follow-up:   [name] was discharged from [facility] in Good condition.  At the hospital follow up visit please address:  1.  Acute sinusitis: She should have follow up with ENT, completed a 7 day course of augmentin COPD: Discharged with 2 more days of prednisone. Also given tussionex for her cough, please assess how this is working and she may need refills. Anxiety: She was very anxious during admission, currently on hydroxyzine, may need psych consult to add additional medications  2.  Labs / imaging needed at time of follow-up: None  3.  Pending labs/ test needing follow-up: None  Follow-up Appointments: Follow-up Information    Welford Roche, MD Follow up.   Specialty:  Internal Medicine Why:  November 4th 10:45 Contact information: [place] [id number] [place] Hospital Course by problem list: 1. Acute sinusitis: This is a 67 year old female with history of recurrent sinusitis previously treated with antibiotics, COPD, breast cancer status post lumpectomy, hypertension, hyperlipidemia, type 2 diabetes, generalized anxiety disorder, and tobacco use.  She has had 3 hospitalizations in the last 3 months.  Found to have rhinitis, sinus congestion, cough, wheezing and dyspnea.  She has had Nasacort at home but reported that she had not been using it.  She was started on Unasyn, Flonase, and Mucinex. A paranasal sinus x-ray on 10/9 showed bilateral air-fluid level consistent with acute maxillary sinusitis, she had been through multiple rounds of antibiotics for her sinusitis.  CT scan was done which showed maxillary fluid levels, mucosal thickening impeding sense of flow, and symmetric polypoid densities consistent with respiratory epithelial adenomatoid Harmatomas.  She started feeling better and Unasyn was switched to Augmentin.  This was continued discharge for a  7-day course.  She will need ENT follow-up, referral was placed on her last clinic visit.  2. COPD: Patient presented with worsening shortness of breath and wheezing.  She was on trilogy at home and reported using her rescue inhaler every 2 hours over the past week.  Chest x-ray showed no evidence.  She was started on prednisone, nebulizers, duo nebs, and Breo.  She did not require any supplemental oxygen during admission.  She continued to have wheezing on exam and reported shortness of breath during her stay, however she reports that this improved.  She may have had a mild exacerbation secondary to acute sinusitis or her anxiety.  She was discharged with prednisone, completing a 5-day course.  3. Anxiety: Patient was very anxious during her admission, she is on hydroxyzine 3 times daily at home.  She did require 1 dose of 1 mg Ativan during her admission.  Her anxiety was stable with her home regimen and her hydroxyzine dose was continued on  discharge.   Discharge Vitals:   BP (!) 147/77 (BP Location: Right Arm)   Pulse (!) 104   Temp 98.3 F (36.8 C) (Oral)   Resp 18   Ht _0  (1.676 m)   Wt 86 kg   SpO2 99%   BMI 30.60 kg/m   Pertinent Labs, Studies, and Procedures:  CBC Latest Ref Rng & Units 09/03/2018 08/17/2018 08/16/2018  WBC 4.0 - 10.5 K/uL 5.1 6.3 7.8  Hemoglobin 12.0 - 15.0 g/dL 12.6 13.3 13.4  Hematocrit 36.0 - 46.0 % 38.5 41.2 41.4  Platelets 150 - 400 K/uL 236 318 324   BMP Latest Ref Rng & Units 09/04/2018 09/03/2018 08/17/2018  Glucose 70 - 99 mg/dL 314(H) 120(H) 108(H)  BUN 8 - 23 mg/dL _1 Creatinine 0.44 - 1.00 mg/dL 0.98 0.70 0.77  BUN/Creat Ratio 12 - 28 - - -  Sodium 135 - 145 mmol/L 133(L) 139 137  Potassium 3.5 - 5.1 mmol/L 3.6 3.5 4.0  Chloride 98 - 111 mmol/L 100 112(H) 101  CO2 22 - 32 mmol/L 18(L) 19(L) 28  Calcium 8.9 - 10.3 mg/dL 8.9 8.5(L) 9.1   CT MAXILLOFACIAL WITHOUT CONTRAST IMPRESSION: 1.  Active sinusitis including bilateral maxillary  fluid levels. 2. Mucosal thickening impedes maxillary and frontal sinus outflow. 3. Symmetric polypoid densities in the anterior nasal cavity consistent with respiratory epithelial adenomatoid hamartomas.  CXR IMPRESSION: Hyperinflation and bronchitic changes. No focal acute pulmonary abnormality.  Discharge Instructions: Discharge Instructions    Call MD for:  difficulty breathing, headache or visual disturbances   Complete by:  As directed    Call MD for:  extreme fatigue   Complete by:  As directed    Call MD for:  hives   Complete by:  As directed    Call MD for:  persistant dizziness or light-headedness   Complete by:  As directed    Call MD for:  persistant nausea and vomiting   Complete by:  As directed    Call MD for:  redness, tenderness, or signs of infection (pain, swelling, redness, odor or green/yellow discharge around incision site)   Complete by:  As directed    Call MD for:  severe uncontrolled pain   Complete by:  As directed    Call MD for:  temperature >100.4   Complete by:  As directed    Diet - low sodium heart healthy   Complete by:  As directed    Discharge instructions   Complete by:  As directed    [name],   It has been a pleasure working with you and we are glad you're feeling better. You were hospitalized for sinusitis and COPD.   START taking prednisone 40 mg (2 tablets) for 2 days START taking Augmentin 1 tablet twice a day for 4 more days START taking Tussionex 5 mL twice a day as needed, this will help you with the cough   We have sent your augmentin and prednisone prescriptions to your pharmacy. Please take the paper prescription for the Tussionex to your pharmacy to have this filled.   Follow up with your primary care provider next Monday. You already have a referral for the ENT (Ears, Nose, and Throat) doctor, please follow up with them about your sinus issues.  If your symptoms worsen or you develop new symptoms, please seek  medical help whether it is your primary care provider or emergency department.  If you have any questions about this hospitalization please  call [phone].   Increase activity slowly   Complete by:  As directed       Signed: Asencion Noble, MD 09/07/2018, 8:33 AM   Pager: [phone]

## 2018-09-04 NOTE — Progress Notes (Signed)
Inpatient Diabetes Program Recommendations  AACE/ADA: New Consensus Statement on Inpatient Glycemic Control (2015)  Target Ranges:  Prepandial:   less than 140 mg/dL      Peak postprandial:   less than 180 mg/dL (1-2 hours)      Critically ill patients:  140 - 180 mg/dL   Results for ZIONA, WICKENS (MRN [id number]) as of 09/04/2018 11:44  Ref. Range 09/04/2018 00:10 09/04/2018 06:50 09/04/2018 11:29  Glucose-Capillary Latest Ref Range: 70 - 99 mg/dL 299 (H) 245 (H) 269 (H)   Review of Glycemic Control  Diabetes history: DM 2 Outpatient Diabetes medications: Metformin 1000 mg BID Current orders for Inpatient glycemic control: Novolog 0-9 units tid  Inpatient Diabetes Program Recommendations:    A1c 5.4% on 8/14 Consider getting an updated A1c level.  Patient received Decadron 10 mg x1 dose given, now on PO prednisone 40 mg Daily. Glucose 245-299 currently. Based on renal function and patient being on steroids consider increasing Novolog Correction to at least 0-15 units tid and adding Novolog hs scale.  Thanks,  Tama Headings RN, MSN, BC-ADM Inpatient Diabetes Coordinator Team Pager [phone] (8a-5p)

## 2018-09-04 NOTE — Progress Notes (Signed)
Subjective: [name] was feeling very anxious today, no acute events overnight. She reports that she was still having some shortness of breath and sinus issues.  She reports that she is having a lot of sinus pressure.  She denied any fevers or chills overnight. She does report that she has been having her symptoms occur for the past couple of weeks and does not feel like she has been getting better. She reports that she missed her hospital follow up appointment last Friday because her daughter was sick with laryngitis, she reports that she did not spend any time in the same room with her daughter.  She also was feeling very anxious and asking when her next anxiety medication was due.  She was very upset during the interview, she reported that she felt like she is always sent out from the hospital when she does not feel that she is ready. She also reported that she did not like having the whole medical team examining her. We discussed that only the attending and myself will examine her.  We discussed the plan for today and she is in agreement.   Objective:  Vital signs in last 24 hours: Vitals:   09/04/18 0030 09/04/18 0202 09/04/18 0646 09/04/18 0750  BP: (!) 163/93 (!) 142/76 (!) 168/77 (!) 160/94  Pulse: (!) 112 (!) 115 (!) 115 (!) 118  Resp: (!) 23 20 20 18   Temp:  99.3 F (37.4 C) 99.1 F (37.3 C) 99 F (37.2 C)  TempSrc:  Oral Oral Oral  SpO2: 100% 99% 99% 100%  Weight:      Height:        General: Well nourished, intermittently anxious appearing, intermittent crying Cardiac: RRR, normal S1, S2, no murmurs, rubs or gallops  Pulmonary: Diffuse wheezing, non-labored breathing, non-tender Abdomen: Soft, non-tender, +bowel sounds, no guarding or masses noted  Extremity: No LE edema, no muscle atrophy, no lesions or wounds noted  Psychiatry: Anxious appearing with intermittent crying    Assessment/Plan:  Active Problems:   COPD exacerbation (HCC) This is a 67 year old  female with history of recurrent sinusitis previously treated with antibiotics, COPD, breast cancer status post lumpectomy, hypertension, hyperlipidemia, type 2 diabetes, generalized anxiety disorder, and tobacco use.  She has had 3 hospitalizations in the last 3 months.  Found to have rhinitis, sinus congestion, cough, wheezing and dyspnea.  She currently uses trilogy at home however only has samples due to financial issues, and clear how often she is using her daily inhaler.  Admitted for COPD exacerbation and recurrent sinusitis.  Shortness of breath, wheezing 2/2 COPD exacerbation: Patient continues to have shortness of breath and has wheezing on exam.  She is on trilogy and home and reports using her rescue inhaler every 2 hours over the past week.  She reports that she is still having shortness of breath.  On exam she had nonlabored breathing and was not in any oxygen, she did have bilateral wheezing on exam, but her most recent O2 sat was 100%.  It's possible that her shortness of breath is multifactorial with a mild COPD exacerbation, recurrent sinusitis, and some component of anxiety.  Does not seem to need antibiotics at this time. -Continue prednisone 40 mg daily -SSI with frequent CBGs due to steroid use -Continue Proventil nebulizer every 4 hours PRN -Continue duonebs q6hr  -Continue breo daily -Continue pulse ox, keep O2 sat greater than 88  Recurrent sinusitis: Continues to complain of sinus pressure and rhinitis.  She reports that  she has Nasacort at home but has not been using it.  A paranasal sinus x-ray on 10/9 showed bilateral air-fluid level consistent with acute maxillary sinusitis.  She was having this worked up on the outpatient basis and has been through doxycycline Cipro with no resolution.  Had a sinus CT skin ordered but was unable to complete this. -Continue hydroxyzine 10 mg 3 times daily as needed -Continue Flonase nasal spray -Continue mucinex -Sinus CT  Anxiety:  Patient reports that she is having a lot of anxiety today.  She has been using her rescue inhaler more frequently over the past 2 weeks which may have some contribution to her anxiety.  She takes Wellbutrin and hydroxyzine at home. Will likely need psychiatry follow up outpatient to adjust medications.  -Continue hydroxyzine 10 mg 3 times daily as needed -Ativan 1mg  once PRN  FEN: No fluids, replete lytes prn, heart healthy diet VTE ppx: Lovenox  Code Status: FULL    Dispo: Anticipated discharge in approximately 1-2 day(s).   Asencion Noble, MD 09/04/2018, 11:49 AM Pager: [phone]

## 2018-09-04 NOTE — Progress Notes (Signed)
Pharmacy Antibiotic Note  [name] is a 67 y.o. female admitted on 09/03/2018 with sinusitis and COPD exacerbation.  Pharmacy has been consulted for Unasyn dosing. Since September she has had courses of azithromycin, doxycycline, ciprofloxacin, and Augmentin for recurrent sinusitis. She is afebrile, WBC are normal, and SCr is normal.   Plan: Unasyn 3 g IV q6h Monitor clinical progress, renal function and de-escalation plans   Height: 5\' 6"  (167.6 cm) Weight: 189 lb 9.5 oz (86 kg) IBW/kg (Calculated) : 59.3  Temp (24hrs), Avg:99.1 F (37.3 C), Min:99 F (37.2 C), Max:99.3 F (37.4 C)  Recent Labs  Lab 09/03/18 1434 09/04/18 0314  WBC 5.1  --   CREATININE 0.70 0.98    Estimated Creatinine Clearance: 61.6 mL/min (by C-G formula based on SCr of 0.98 mg/dL).    Allergies  Allergen Reactions  . Ace Inhibitors Swelling    Throat swelling. Lisinopril-HCTZ  . Flonase [Fluticasone] Other (See Comments)    Sinuses stop up and condition worsens     Thank you for allowing pharmacy to be a part of this patient's care.  Renold Genta, PharmD, BCPS Clinical Pharmacist Clinical phone for 09/04/2018 until 10p is [phone] Please check AMION for all Pharmacist numbers by unit 09/04/2018 2:34 PM

## 2018-09-05 DIAGNOSIS — Z79899 Other long term (current) drug therapy: Secondary | ICD-10-CM | POA: Diagnosis not present

## 2018-09-05 DIAGNOSIS — Z7951 Long term (current) use of inhaled steroids: Secondary | ICD-10-CM | POA: Diagnosis not present

## 2018-09-05 DIAGNOSIS — F411 Generalized anxiety disorder: Secondary | ICD-10-CM | POA: Diagnosis not present

## 2018-09-05 DIAGNOSIS — Z720 Tobacco use: Secondary | ICD-10-CM | POA: Diagnosis not present

## 2018-09-05 DIAGNOSIS — Z888 Allergy status to other drugs, medicaments and biological substances status: Secondary | ICD-10-CM | POA: Diagnosis not present

## 2018-09-05 DIAGNOSIS — K219 Gastro-esophageal reflux disease without esophagitis: Secondary | ICD-10-CM | POA: Diagnosis present

## 2018-09-05 DIAGNOSIS — J0101 Acute recurrent maxillary sinusitis: Secondary | ICD-10-CM | POA: Diagnosis not present

## 2018-09-05 DIAGNOSIS — Z981 Arthrodesis status: Secondary | ICD-10-CM | POA: Diagnosis not present

## 2018-09-05 DIAGNOSIS — J0100 Acute maxillary sinusitis, unspecified: Secondary | ICD-10-CM | POA: Diagnosis present

## 2018-09-05 DIAGNOSIS — E785 Hyperlipidemia, unspecified: Secondary | ICD-10-CM | POA: Diagnosis not present

## 2018-09-05 DIAGNOSIS — Z7952 Long term (current) use of systemic steroids: Secondary | ICD-10-CM | POA: Diagnosis not present

## 2018-09-05 DIAGNOSIS — Z9221 Personal history of antineoplastic chemotherapy: Secondary | ICD-10-CM | POA: Diagnosis not present

## 2018-09-05 DIAGNOSIS — I2510 Atherosclerotic heart disease of native coronary artery without angina pectoris: Secondary | ICD-10-CM | POA: Diagnosis present

## 2018-09-05 DIAGNOSIS — Z923 Personal history of irradiation: Secondary | ICD-10-CM | POA: Diagnosis not present

## 2018-09-05 DIAGNOSIS — Z7984 Long term (current) use of oral hypoglycemic drugs: Secondary | ICD-10-CM | POA: Diagnosis not present

## 2018-09-05 DIAGNOSIS — Z716 Tobacco abuse counseling: Secondary | ICD-10-CM | POA: Diagnosis not present

## 2018-09-05 DIAGNOSIS — E1169 Type 2 diabetes mellitus with other specified complication: Secondary | ICD-10-CM | POA: Diagnosis present

## 2018-09-05 DIAGNOSIS — Z7982 Long term (current) use of aspirin: Secondary | ICD-10-CM | POA: Diagnosis not present

## 2018-09-05 DIAGNOSIS — F17210 Nicotine dependence, cigarettes, uncomplicated: Secondary | ICD-10-CM | POA: Diagnosis present

## 2018-09-05 DIAGNOSIS — F329 Major depressive disorder, single episode, unspecified: Secondary | ICD-10-CM | POA: Diagnosis present

## 2018-09-05 DIAGNOSIS — Z853 Personal history of malignant neoplasm of breast: Secondary | ICD-10-CM | POA: Diagnosis not present

## 2018-09-05 DIAGNOSIS — I10 Essential (primary) hypertension: Secondary | ICD-10-CM | POA: Diagnosis not present

## 2018-09-05 DIAGNOSIS — J310 Chronic rhinitis: Secondary | ICD-10-CM | POA: Diagnosis present

## 2018-09-05 DIAGNOSIS — J441 Chronic obstructive pulmonary disease with (acute) exacerbation: Secondary | ICD-10-CM | POA: Diagnosis present

## 2018-09-05 DIAGNOSIS — Z599 Problem related to housing and economic circumstances, unspecified: Secondary | ICD-10-CM | POA: Diagnosis not present

## 2018-09-05 DIAGNOSIS — Z90710 Acquired absence of both cervix and uterus: Secondary | ICD-10-CM | POA: Diagnosis not present

## 2018-09-05 DIAGNOSIS — E119 Type 2 diabetes mellitus without complications: Secondary | ICD-10-CM | POA: Diagnosis not present

## 2018-09-05 DIAGNOSIS — Z8701 Personal history of pneumonia (recurrent): Secondary | ICD-10-CM | POA: Diagnosis not present

## 2018-09-05 LAB — GLUCOSE, CAPILLARY
GLUCOSE-CAPILLARY: 344 mg/dL — AB (ref 70–99)
Glucose-Capillary: 205 mg/dL — ABNORMAL HIGH (ref 70–99)
Glucose-Capillary: 135 mg/dL — ABNORMAL HIGH (ref 70–99)
Glucose-Capillary: 183 mg/dL — ABNORMAL HIGH (ref 70–99)
Glucose-Capillary: 128 mg/dL — ABNORMAL HIGH (ref 70–99)

## 2018-09-05 MED ORDER — AMOXICILLIN-POT CLAVULANATE 875-125 MG PO TABS
1.0000 | ORAL_TABLET | Freq: Two times a day (BID) | ORAL | Status: DC
  Administered 2018-09-05 – 2018-09-06 (×4): 1 via ORAL
  Filled 2018-09-05 (×4): qty 1

## 2018-09-05 NOTE — Progress Notes (Signed)
Internal Medicine Attending:   I saw and examined the patient. I reviewed the resident's note and I agree with the resident's findings and plan as documented in the resident's note.  Patient states that she feels better today but still has some persistent wheezing.  She also reports a productive cough with whitish phlegm.  Patient was initially admitted to the hospital with worsening shortness of breath and was found to have an acute COPD exacerbation as well as acute sinusitis.  Patient has had multiple admissions in the last 3 months for similar complaints.  Patient was noted to have an acute sinusitis on CT sinuses.  She was initially started Unasyn for this and transition to oral  Augmentin.  She will need ENT follow-up as an outpatient given her recurrent episodes of sinusitis.  We will continue with Flonase, Mucinex and hydroxyzine for now.  Patient is also noted to have an acute COPD exacerbation on admission.  She still has bilateral diffuse expiratory wheezing on exam.  We will continue with prednisone 40 mg daily as well as oxygen via nasal cannula as needed.  Continue with nebulizer treatments as well as her home inhaler.  Patient is very anxious about going home.  Explained to the patient that she is improving and that she would likely be stable for discharge in the morning.

## 2018-09-05 NOTE — Progress Notes (Signed)
Pt currently asking for food to eat over night. Pt explained that her CBG was 218 before bed time and with giving food at this time will increase it over night. Pt understood and would just like something to drink.

## 2018-09-05 NOTE — Progress Notes (Signed)
   Subjective: [name] was doing well today, no acute events overnight. She reported that she felt like her breathing has improved since she has been here but she is still feeling short of breath. She reported that she is still having some congestion and headaches. We discussed that we can transition to oral antibiotics today and she is in agreement.   Objective:  Vital signs in last 24 hours: Vitals:   09/04/18 1859 09/04/18 2030 09/04/18 2223 09/05/18 0641  BP: (!) 167/79  (!) 143/85 (!) 144/74  Pulse: (!) 111  (!) 102 (!) 108  Resp: 18   17  Temp: 99.1 F (37.3 C)  98.5 F (36.9 C) 98.2 F (36.8 C)  TempSrc: Oral  Oral Oral  SpO2: 100% 100% 99% 97%  Weight:      Height:        General: Well nourished, well appearing, NAD  Cardiac: RRR, normal S1, S2, no murmurs, rubs or gallops  Pulmonary: Diffuse wheezing through out, non-labored breathing Abdomen: Soft, non-tender, +bowel sounds, no guarding or masses noted  Extremity: No LE edema, no muscle atrophy, no lesions or wounds noted  Psychiatry: Normal mood and affect     Assessment/Plan:  Active Problems:   COPD exacerbation (HCC)  This is a 67 year old female with history of recurrent sinusitis previously treated with antibiotics, COPD, breast cancer status post lumpectomy, hypertension, hyperlipidemia, type 2 diabetes, generalized anxiety disorder, and tobacco use. She has had 3 hospitalizations in the last 3 months. Found to have rhinitis, sinus congestion, cough, wheezing and dyspnea. She currently uses trilogy at home however only has samples due to financial issues, and clear how often she is using her daily inhaler. Admitted for COPD exacerbation and recurrent sinusitis.   Acute sinusitis: Patient has a history of recurrent sinusitis for which she has undergone multiple rounds of antibiotics. She reported that she had sinus pressure and rhinitis on admission.  Sinus CT scan showed acute sinusitis including maxillary  fluid levels, mucosal thickening impeding sinus outflow, and symmetric polypoid densities consistent with respiratory epithelial adenomatoid hamartomas. She was started on unasyn yesterday and reports that she is feeling better.  -Switch from Unasyn to Augmentin today -Will need ENT follow up outpatient given history of recurrent sinusitis -Continue flonase -Continue Mucinex -Continue hydroxizine   Shortness of breath, wheezing: Improving, appears to be more related to her anxiety however does have some component of COPD. Her breathing appears to be improving with the current regimen. She is not requiring any oxygen at the moment, O2 sat is WNL. She is still having diffuse wheezing and will benefit from continuing her current regimen.  -Continue prednisone 40 mg daily  -SSI with frequent CBGs due to steroid use  -Continue Proventil nebulizer every 4 hours PRN  -Continue duonebs q6hr  -Continue breo daily  -Continue pulse ox, keep O2 sat greater than 88   Anxiety: Patient's anxiety has improved today. Will continue home medications for now.  -Continue hydroxyzine 10 mg 3 times daily as needed  -s/p Ativan 1mg  once PRN   FEN: No fluids, replete lytes prn, heart healthy diet VTE ppx: Lovenox  Code Status: FULL    Dispo: Anticipated discharge in approximately 1 day(s).    Asencion Noble, MD 09/05/2018, 6:54 AM Pager: [phone]

## 2018-09-06 ENCOUNTER — Telehealth: Payer: Self-pay

## 2018-09-06 LAB — GLUCOSE, CAPILLARY
GLUCOSE-CAPILLARY: 309 mg/dL — AB (ref 70–99)
Glucose-Capillary: 201 mg/dL — ABNORMAL HIGH (ref 70–99)
Glucose-Capillary: 228 mg/dL — ABNORMAL HIGH (ref 70–99)

## 2018-09-06 MED ORDER — HYDROCOD POLST-CPM POLST ER 10-8 MG/5ML PO SUER
5.0000 mL | Freq: Two times a day (BID) | ORAL | Status: DC
  Administered 2018-09-06 (×2): 5 mL via ORAL
  Filled 2018-09-06 (×2): qty 5

## 2018-09-06 MED ORDER — BENZONATATE 100 MG PO CAPS: 200.0000 mg | ORAL_CAPSULE | Freq: Three times a day (TID) | ORAL | Status: DC | PRN

## 2018-09-06 MED ORDER — HYDROCOD POLST-CPM POLST ER 10-8 MG/5ML PO SUER: 5.0000 mL | Freq: Two times a day (BID) | ORAL | Status: DC | PRN

## 2018-09-06 MED ORDER — AMOXICILLIN-POT CLAVULANATE 875-125 MG PO TABS: 1.0000 | ORAL_TABLET | Freq: Two times a day (BID) | ORAL | 0 refills | Status: AC

## 2018-09-06 MED ORDER — HYDROCOD POLST-CPM POLST ER 10-8 MG/5ML PO SUER: 5.0000 mL | Freq: Two times a day (BID) | ORAL | 0 refills | Status: DC

## 2018-09-06 MED ORDER — PREDNISONE 20 MG PO TABS: 40.0000 mg | ORAL_TABLET | Freq: Every day | ORAL | 0 refills | Status: DC

## 2018-09-06 NOTE — Progress Notes (Signed)
Patient given all discharge information and prescriptions, all belongings with patient upon discharge, patient discharged to home with husband. All questions answered.

## 2018-09-06 NOTE — Telephone Encounter (Signed)
Hospital TOC per Dr Sherry Ruffing, discharge 09/06/2018, appt 09/15/2018.

## 2018-09-06 NOTE — Discharge Instructions (Signed)
[name],   It has been a pleasure working with you and we are glad you're feeling better. You were hospitalized for sinusitis and COPD.   START taking prednisone 40 mg (2 tablets) for 2 days START taking Augmentin 1 tablet twice a day for 4 more days START taking Tussionex 5 mL twice a day as needed, this will help you with the cough   We have sent your augmentin and prednisone prescriptions to your pharmacy. Please take the paper prescription for the Tussionex to your pharmacy to have this filled.   Follow up with your primary care provider next Monday. You already have a referral for the ENT (Ears, Nose, and Throat) doctor, please follow up with them about your sinus issues.  If your symptoms worsen or you develop new symptoms, please seek medical help whether it is your primary care provider or emergency department.  If you have any questions about this hospitalization please call [phone].

## 2018-09-06 NOTE — Progress Notes (Signed)
   Subjective: [name] had no acute events overnight. She was sleeping when we walked in and appeared comfortable. She reported that she did not have a good night, she states that she was coughing a lot and feeling unwell. She reported that she is unable to cough up the stuff that is stuck in her lungs. We discussed the plan for today.   Objective:  Vital signs in last 24 hours: Vitals:   09/05/18 1632 09/05/18 2059 09/05/18 2344 09/06/18 0110  BP: (!) 160/81  (!) 150/88   Pulse: 97  86   Resp: 18  14   Temp: 98 F (36.7 C)  97.9 F (36.6 C)   TempSrc: Axillary  Oral   SpO2: 95% 98% 100% 98%  Weight:      Height:        General: Well nourished, well appearing, NAD  Cardiac: RRR, normal S1, S2, no murmurs, rubs or gallops  Pulmonary: Minimal wheezing diffusely, no rhonchi or rales  Abdomen: Soft, non-tender, +bowel sounds, no guarding or masses noted  Extremity: No LE edema, no muscle atrophy, no lesions or wounds noted  Psychiatry: Normal mood and affect     Assessment/Plan:  Active Problems:   COPD exacerbation (HCC)  This is a 67 year old female with history of recurrent sinusitis previously treated with antibiotics, COPD, breast cancer status post lumpectomy, hypertension, hyperlipidemia, type 2 diabetes, generalized anxiety disorder, and tobacco use. She has had 3 hospitalizations in the last 3 months. Found to have rhinitis, sinus congestion, cough, wheezing and dyspnea. She currently uses trilogy at home however only has samples due to financial issues, and clear how often she is using her daily inhaler. Admitted for COPD exacerbation and found to have acute sinusitis.   Acute sinusitis: She reports that she is still having coughing and is feeling unwell. Has a history of recurrent sinusitis, will need ENT follow up.  -Sinus CT scan showed acute sinusitis including maxillary fluid levels, mucosal thickening impeding sinus outflow, and symmetric polypoid densities  consistent with respiratory epithelial adenomatoid hamartomas. -Continue Augmentin, today is day 3 of antibiotics, continue for a 7 day course -Will need ENT follow up outpatient given history of recurrent sinusitis -Continue flonase -Continue Mucinex -Continue hydroxizine   Shortness of breath, wheezing: Improving, appears to be more related to her anxiety however does have some component of COPD. She is not requiring any oxygen at the moment, O2 sat is WNL. She is still having minimal diffuse wheezing and will benefit from continuing her current regimen.  -Continue prednisone 40 mg daily, today is day 3/5 -SSI with frequent CBGs due to steroid use  -Continue Proventil nebulizer every 4 hours PRN  -Continue duonebs q6hr  -Continue breo daily  -Continue pulse ox, keep O2 sat greater than 88   Anxiety: Patient's anxiety has improved today. Will continue home medications for now.  -Continue hydroxyzine 10 mg 3 times daily as needed   FEN:No fluids, replete lytes prn,heart healthydiet VTE ppx: Lovenox  Code Status: FULL   Dispo: Anticipated discharge in approximately today.   Asencion Noble, MD 09/06/2018, 6:24 AM Pager: [phone]

## 2018-09-06 NOTE — Progress Notes (Addendum)
Pt stated she felt a little anxious regarding discharge d/t high CBGs during hospitalization. RN contacted MD and they assured that it was due to prednisone use in the hospital, and directed RN to tell pt to take metformin as prescribed as well as check CBGs regularly. RN spoke with pt and pt agreed to DC and felt more comfortable after talking with RN. Pt was informed that she could appeal DC if she was not comfortable, but pt declined and said she felt ready to leave. Pt said husband would be here to pick her up around [place]

## 2018-09-06 NOTE — Progress Notes (Signed)
Internal Medicine Attending:   I saw and examined the patient. I reviewed the resident's note and I agree with the resident's findings and plan as documented in the resident's note.  Patient states that she was coughing a lot overnight and that she does not feel well.  She was sleeping when we came in and appeared comfortable.  We discussed with overnight RN who stated that the patient did well overnight.  Patient was initially admitted to the hospital with recurrent sinusitis as well as acute COPD exacerbation.  Patient's lung exam continues to improve and she has only minimal wheezing on exam.  Will complete a course of Augmentin for her sinusitis.  Given her episodes of recurrent sinusitis patient will need to follow-up with ENT as an outpatient for further evaluation.  Continue Flonase and Mucinex as well.  Patient complains of persistent coughing which she attributes to her COPD exacerbation.  Will complete a 5-day course of prednisone.  Continue with nebs as needed as well as her home inhalers.  Patient appears stable for discharge in my opinion but patient appears extremely anxious when we bring up discharge and states that she is always discharged too soon and she states that she will come right back to the hospital as she does not feel like she is ready to go home.  We added Tussionex to help control her cough and will reevaluate her this afternoon to see if she would be stable for discharge home.

## 2018-09-11 ENCOUNTER — Ambulatory Visit: Payer: Medicare Other

## 2018-09-11 ENCOUNTER — Encounter: Payer: Self-pay | Admitting: Internal Medicine

## 2018-09-14 ENCOUNTER — Other Ambulatory Visit: Payer: Self-pay

## 2018-09-14 ENCOUNTER — Other Ambulatory Visit: Payer: Self-pay | Admitting: Internal Medicine

## 2018-09-14 ENCOUNTER — Encounter: Payer: Self-pay | Admitting: Internal Medicine

## 2018-09-14 ENCOUNTER — Ambulatory Visit (INDEPENDENT_AMBULATORY_CARE_PROVIDER_SITE_OTHER): Payer: Medicare Other | Admitting: Internal Medicine

## 2018-09-14 VITALS — BP 146/68 | HR 109 | Temp 100.0°F | Ht 66.0 in | Wt 200.9 lb

## 2018-09-14 DIAGNOSIS — M1711 Unilateral primary osteoarthritis, right knee: Secondary | ICD-10-CM

## 2018-09-14 DIAGNOSIS — J0191 Acute recurrent sinusitis, unspecified: Secondary | ICD-10-CM | POA: Diagnosis not present

## 2018-09-14 DIAGNOSIS — G8929 Other chronic pain: Secondary | ICD-10-CM

## 2018-09-14 DIAGNOSIS — Z7951 Long term (current) use of inhaled steroids: Secondary | ICD-10-CM | POA: Diagnosis not present

## 2018-09-14 DIAGNOSIS — J441 Chronic obstructive pulmonary disease with (acute) exacerbation: Secondary | ICD-10-CM

## 2018-09-14 DIAGNOSIS — F419 Anxiety disorder, unspecified: Secondary | ICD-10-CM | POA: Diagnosis not present

## 2018-09-14 DIAGNOSIS — J449 Chronic obstructive pulmonary disease, unspecified: Secondary | ICD-10-CM

## 2018-09-14 DIAGNOSIS — F17210 Nicotine dependence, cigarettes, uncomplicated: Secondary | ICD-10-CM

## 2018-09-14 DIAGNOSIS — Z79899 Other long term (current) drug therapy: Secondary | ICD-10-CM

## 2018-09-14 DIAGNOSIS — J330 Polyp of nasal cavity: Secondary | ICD-10-CM | POA: Diagnosis not present

## 2018-09-14 DIAGNOSIS — Z7952 Long term (current) use of systemic steroids: Secondary | ICD-10-CM | POA: Diagnosis not present

## 2018-09-14 MED ORDER — AZITHROMYCIN 250 MG PO TABS: ORAL_TABLET | ORAL | 0 refills | Status: AC

## 2018-09-14 MED ORDER — INDOMETHACIN 25 MG PO CAPS: 25.0000 mg | ORAL_CAPSULE | Freq: Two times a day (BID) | ORAL | 0 refills | Status: DC | PRN

## 2018-09-14 MED ORDER — ALBUTEROL SULFATE (2.5 MG/3ML) 0.083% IN NEBU
2.5000 mg | INHALATION_SOLUTION | Freq: Once | RESPIRATORY_TRACT | Status: AC
  Administered 2018-09-14: 2.5 mg via RESPIRATORY_TRACT

## 2018-09-14 MED ORDER — PREDNISONE 20 MG PO TABS: 40.0000 mg | ORAL_TABLET | Freq: Every day | ORAL | 0 refills | Status: DC

## 2018-09-14 MED ORDER — IPRATROPIUM BROMIDE 0.02 % IN SOLN
0.5000 mg | Freq: Once | RESPIRATORY_TRACT | Status: AC
  Administered 2018-09-14: 0.5 mg via RESPIRATORY_TRACT

## 2018-09-14 MED ORDER — METHYLPREDNISOLONE SODIUM SUCC 40 MG IJ SOLR
40.0000 mg | Freq: Once | INTRAMUSCULAR | Status: AC
  Administered 2018-09-14: 40 mg via INTRAMUSCULAR

## 2018-09-14 MED ORDER — IPRATROPIUM-ALBUTEROL 0.5-2.5 (3) MG/3ML IN SOLN: 3.0000 mL | Freq: Once | RESPIRATORY_TRACT | Status: DC

## 2018-09-14 NOTE — Progress Notes (Signed)
CC: hospital follow up  HPI: [name] is a 67 y.o.  with a PMH listed below presenting for hospital follow up. She was in the hospital from 10/27-10/30 for acute sinusitis, COPD and anxiety.   Please see A&P for status of the patient's chronic medical conditions  Past Medical History:  Diagnosis Date  . Anxiety   . Arthritis    "fingers, knees" (08/16/2018)  . Asthma   . Cancer of right breast ([place]) 1991   s/p lumpectomy, chemotherapy and radiation therapy in 1991. Mammogram in 2007 was normal.  . Constipated    h/o  . COPD (chronic obstructive pulmonary disease) ([place])    History of multiple hospital admissions for exercabation   . COPD with exacerbation ([place]) 04/06/2009   Qualifier: Diagnosis of  By: Eyvonne Mechanic MD, Vijay    . Depression   . Diarrhea    h/o  . GERD (gastroesophageal reflux disease)   . Headache    "a few times/month" (08/16/2018  . Heart murmur 10/05/11   "first time I ever heard I had one was today"  . Hyperlipidemia   . Hypertension   . Obesity   . Personal history of chemotherapy   . Personal history of radiation therapy   . Pneumonia    "couple times in the last 10-15 yrs" (08/16/2018)  . Shortness of breath 10/05/11   "at rest; lying down; w/exertion"  . Sigmoid diverticulitis 80/2008  . Tobacco abuse   . Type II diabetes mellitus ([place])    Review of Systems: Refer to history of present illness and assessment and plans for pertinent review of systems, all others reviewed and negative.  Physical Exam:  Vitals:   09/14/18 0855  BP: (!) 146/68  Pulse: (!) 109  Temp: 100 F (37.8 C)  TempSrc: Oral  SpO2: 100%  Weight: 200 lb 14.4 oz (91.1 kg)  Height: 5\' 6"  (1.676 m)    Physical Exam  Constitutional: She is oriented to person, place, and time.  Anxious appearing female, intermittent tears, mild distress  HENT:  Head: Normocephalic and atraumatic.  No sinus tenderness to palpation, rhinorrhea  Eyes: Pupils are equal, round, and  reactive to light. Conjunctivae and EOM are normal.  Neck: Normal range of motion. Neck supple.  Cardiovascular: Normal rate, regular rhythm and normal heart sounds. Exam reveals no gallop and no friction rub.  No murmur heard. Pulmonary/Chest: No respiratory distress. She has wheezes (diffuse wheezing).  Mildly labored breathing  Abdominal: Soft. Bowel sounds are normal. She exhibits no distension.  Musculoskeletal: Normal range of motion.  Neurological: She is alert and oriented to person, place, and time. Gait normal.  Skin: Skin is warm and dry. No erythema.  Psychiatric: Affect normal.  Anxious appearing    Social History   Socioeconomic History  . Marital status: Married    Spouse name: Not on file  . Number of children: Not on file  . Years of education: 24  . Highest education level: Not on file  Occupational History    Employer: UNEMPLOYED  Social Needs  . Financial resource strain: Not on file  . Food insecurity:    Worry: Not on file    Inability: Not on file  . Transportation needs:    Medical: Not on file    Non-medical: Not on file  Tobacco Use  . Smoking status: Current Every Day Smoker    Packs/day: 0.50    Years: 45.00    Pack years: 22.50  Types: Cigarettes  . Smokeless tobacco: Never Used  Substance and Sexual Activity  . Alcohol use: Yes    Alcohol/week: 4.0 standard drinks    Types: 4 Cans of beer per week    Comment: 08/16/2018 "weekends only"  . Drug use: No  . Sexual activity: Yes  Lifestyle  . Physical activity:    Days per week: Not on file    Minutes per session: Not on file  . Stress: Not on file  Relationships  . Social connections:    Talks on phone: Not on file    Gets together: Not on file    Attends religious service: Not on file    Active member of club or organization: Not on file    Attends meetings of clubs or organizations: Not on file    Relationship status: Not on file  . Intimate partner violence:    Fear of current  or ex partner: Not on file    Emotionally abused: Not on file    Physically abused: Not on file    Forced sexual activity: Not on file  Other Topics Concern  . Not on file  Social History Narrative   Lives in [place] with her husband.   Takes care of 3 grand children.   Trying to find a job, has financial difficulties.          Family History  Problem Relation Age of Onset  . Cancer Mother     Assessment & Plan:   See Encounters Tab for problem based charting.  Patient seen with Dr. Daryll Drown

## 2018-09-14 NOTE — Assessment & Plan Note (Signed)
>>  ASSESSMENT AND PLAN FOR CHRONIC OBSTRUCTIVE PULMONARY DISEASE WITH BRONCHOSPASM ([place]) WRITTEN ON 09/14/2018 10:21 AM BY Asencion Noble, MD  Assessment: Patient was recently discharged last week a COPD patient.  She finished a 5-day course of steroids. She reports that she still having a lot of wheezing, productive cough with yellow sputum, shortness of breath, and some chest tightness. She reports that she takes trilogy, Proventil, and nebulizer.  She states she has been using the inhaler and the nebulizer about 3-4 times a day. On exam she does have diffuse wheezing, no labored breathing and oxygenating 100%, temperature of 100.  It appears that she does still have some mild COPD exacerbation which benefit from a course of antibiotics and steroids.  Plan: -Z-Pak -5-day course of steroids -Breathing treatment in clinic

## 2018-09-14 NOTE — Assessment & Plan Note (Addendum)
Assessment: Patient has a history of right knee pain for the past 5-10 years, consistent with osteoarthritis changes. She has received steroid injections in the past and her last injection was on 06/29/18.  She reports that she is having significant pain in her right knee that is sharp and throbbing in nature.  She reports that she only takes Tylenol she reports mild improvement with that.  She has tried Mobic 7.5 in the past however states that this did not help.  Since her steroid injection was less than 3 months ago she will need to wait for a few weeks before getting another one.  Plan: -Trial of indomethacin for 7 days, given her prescription of prednisone she was advised to hold this if she develops dyspepsia symptoms -Follow-up in 1 month for knee injection

## 2018-09-14 NOTE — Assessment & Plan Note (Addendum)
Assessment: Patient was recently discharged last week a COPD patient.  She finished a 5-day course of steroids. She reports that she still having a lot of wheezing, productive cough with yellow sputum, shortness of breath, and some chest tightness. She reports that she takes trilogy, Proventil, and nebulizer.  She states she has been using the inhaler and the nebulizer about 3-4 times a day. On exam she does have diffuse wheezing, no labored breathing and oxygenating 100%, temperature of 100.  It appears that she does still have some mild COPD exacerbation which benefit from a course of antibiotics and steroids.  Plan: -Z-Pak -5-day course of steroids -Breathing treatment in clinic

## 2018-09-14 NOTE — Patient Instructions (Addendum)
Thank you for allowing [place] to provide your care today. Today we discussed your COPD, sinusitis, and knee pain    Today we made some changes to your medications.   Please start the Z-pac starting today, take this for 5 days.  Please start the prednisone tomorrow, you have received the first dose of steroids in the clinic.  You can take the indomethacin twice a day as needed for your knee pain. Please stop this and call the clinic if you start to develop stomach issues.  We have sent a referral for ENT for your recurrent sinusitis.   Please follow-up in 1 month for a knee injection.    Should you have any questions or concerns please call the internal medicine clinic at [phone].

## 2018-09-14 NOTE — Telephone Encounter (Signed)
Pt was seen in office today, 09/14/18 for hospital follow up.  SChaplin, RN,BSN

## 2018-09-14 NOTE — Assessment & Plan Note (Signed)
Assessment: Patient reports that she still having headaches, runny nose, and sinus pressure.  She reports that she completed her course of Augmentin.  She reports that she did have a bump in her nose that was very painful however the pain has improved. CT scan showed symmetric polypoid densities in the anterior nasal cavity consistent with respiratory epithelial adenomatoid hamartomas.  Given these findings and her recurrent sinusitis she will benefit from an ENT referral to discuss treatment options.   Plan: -ENT referral

## 2018-09-15 ENCOUNTER — Encounter: Payer: Medicare Other | Admitting: Internal Medicine

## 2018-09-15 NOTE — Addendum Note (Signed)
Addended by: Gilles Chiquito B on: 09/15/2018 02:15 PM   Modules accepted: Level of Service

## 2018-09-15 NOTE — Progress Notes (Signed)
Internal Medicine Clinic Attending  I saw and evaluated the patient.  I personally confirmed the key portions of the history and exam documented by Dr. Krienke and I reviewed pertinent patient test results.  The assessment, diagnosis, and plan were formulated together and I agree with the documentation in the resident's note.    

## 2018-09-19 ENCOUNTER — Telehealth: Payer: Self-pay

## 2018-09-19 NOTE — Telephone Encounter (Signed)
error 

## 2018-09-19 NOTE — Telephone Encounter (Signed)
Appears she is on max dose of metformin already (1000mg  twice a day), would not have her increase this more.  Agree with [facility] appointment, she should stay well hydrated with water.

## 2018-09-19 NOTE — Telephone Encounter (Signed)
Pt calls and states she is on prednisone and her feet are swelling for about 3 days, she thinks her blood sugar is up and ask if she can take a little more metformin because her swelling is making her stressed and she cant check her sugar because she doesn't have a meter. She states she cannot come to clinic today and maybe not tomorrow so someone needs to say she can take more metformin. She denies chest pain, short of breath, dizziness, weakness, diaphoresis, confusion, vision changes, N&V, unsteady gait, falls. She states just her feet and lower legs swelling. She is given [facility] appt in am at 0830. She is ask to call 911 if she has any above symptoms and is agreeable

## 2018-09-19 NOTE — Telephone Encounter (Signed)
Instructed her also in discussion to stay well hydrated and feet up til appt

## 2018-09-20 ENCOUNTER — Other Ambulatory Visit: Payer: Self-pay

## 2018-09-20 ENCOUNTER — Ambulatory Visit (INDEPENDENT_AMBULATORY_CARE_PROVIDER_SITE_OTHER): Payer: Medicare Other | Admitting: Internal Medicine

## 2018-09-20 VITALS — BP 153/61 | HR 78 | Temp 98.9°F | Ht 66.0 in | Wt 203.7 lb

## 2018-09-20 DIAGNOSIS — M7989 Other specified soft tissue disorders: Secondary | ICD-10-CM

## 2018-09-20 DIAGNOSIS — E119 Type 2 diabetes mellitus without complications: Secondary | ICD-10-CM

## 2018-09-20 DIAGNOSIS — R42 Dizziness and giddiness: Secondary | ICD-10-CM

## 2018-09-20 DIAGNOSIS — R079 Chest pain, unspecified: Secondary | ICD-10-CM

## 2018-09-20 DIAGNOSIS — J449 Chronic obstructive pulmonary disease, unspecified: Secondary | ICD-10-CM | POA: Diagnosis not present

## 2018-09-20 DIAGNOSIS — Z7984 Long term (current) use of oral hypoglycemic drugs: Secondary | ICD-10-CM | POA: Diagnosis not present

## 2018-09-20 DIAGNOSIS — F17210 Nicotine dependence, cigarettes, uncomplicated: Secondary | ICD-10-CM

## 2018-09-20 DIAGNOSIS — I493 Ventricular premature depolarization: Secondary | ICD-10-CM

## 2018-09-20 DIAGNOSIS — R600 Localized edema: Secondary | ICD-10-CM

## 2018-09-20 MED ORDER — ACCU-CHEK NANO SMARTVIEW W/DEVICE KIT: 1.0000 | PACK | 0 refills | Status: DC

## 2018-09-20 NOTE — Progress Notes (Signed)
CC: Lower extremity swelling, dizziness and light headedness  HPI:  [name] is a 67 y.o.  with a PMH listed below presenting for lower extremity swelling, dizziness and chest pain.   In regards to the lower extremity swelling she reports that this has been present since her last visit. She reports that it has been up to her knees. She reports some mild pain with this. She has been on prednisone for her COPD exacerbation and she finished this on Sunday. She reports that the swelling had gotten worse so she called the clinic yesterday to get an appointment. However since last night and today they swelling has gone down significantly and she reports her legs are back to normal now. She has no history of any heart failure, last Echo showed an EF of 60-65%. She also denies any worsening shortness of breath, palpitations, orthopnea, or PND. On exam she did had trace LE edema, pulmonary and cardiac exam WNL. It appears that this was related to the prednisone that she had been on for her COPD exacerbation. The issues has resolved on it's own and patient was advised to continue to monitor for now.   In regards to her dizziness and chest pain she reports that yesterday afternoon she was standing in her kitchen when she had a short episode of chest pain. The chest pain lasted for 1-2 seconds, located over the left chest, sharp in nature, no radiation, no associated diaphoresis, nausea, or vomiting. She denied any exertion prior to this episode. It has not repeated it since then. This does not seem like angina. She did have some dizziness and light headedness that lasted for about 2 minutes, she needed to go and sit down which resolved this. She reports that this had not happened before. On exam she had no neurologic deficits, cardiac and pulmonary exam were normal. On chart review an EKG done on 10/28 was nearly normal except for a rare PVC. Labs taken 2 weeks ago showed no anemia or electrolyte  abnormalities. She does have a history of diabetes however her symptoms did not appear to fit iwht a hypoglycemia episode and she is only on metformin for this. Informed patient of these results and advised her that we will monitor this for now. If the symptoms reoccur we can re-assess and she may need a Holter monitor to assess for arrhythmias.   Reports that she has not been able to check her sugars because it is expired. Last A1c was 6/4 on 02/2017. Is currently o metformin. Denies any issues with this. Repeat A1c on next visit.  -Glucometer  Please see A&P for status of the patient's chronic medical conditions  Past Medical History:  Diagnosis Date  . Anxiety   . Arthritis    "fingers, knees" (08/16/2018)  . Asthma   . Cancer of right breast ([place]) 1991   s/p lumpectomy, chemotherapy and radiation therapy in 1991. Mammogram in 2007 was normal.  . Constipated    h/o  . COPD (chronic obstructive pulmonary disease) ([place])    History of multiple hospital admissions for exercabation   . COPD with exacerbation ([place]) 04/06/2009   Qualifier: Diagnosis of  By: Eyvonne Mechanic MD, Vijay    . Depression   . Diarrhea    h/o  . GERD (gastroesophageal reflux disease)   . Headache    "a few times/month" (08/16/2018  . Heart murmur 10/05/11   "first time I ever heard I had one was today"  . Hyperlipidemia   .  Hypertension   . Obesity   . Personal history of chemotherapy   . Personal history of radiation therapy   . Pneumonia    "couple times in the last 10-15 yrs" (08/16/2018)  . Shortness of breath 10/05/11   "at rest; lying down; w/exertion"  . Sigmoid diverticulitis 80/2008  . Tobacco abuse   . Type II diabetes mellitus ([place])    Review of Systems: Refer to history of present illness and assessment and plans for pertinent review of systems, all others reviewed and negative.  Physical Exam:  Vitals:   09/20/18 0847  BP: (!) 153/61  Pulse: 78  Temp: 98.9 F (37.2 C)  TempSrc: Oral  SpO2:  99%  Weight: 203 lb 11.2 oz (92.4 kg)  Height: 5\' 6"  (1.676 m)    Physical Exam  Constitutional: She is oriented to person, place, and time and well-developed, well-nourished, and in no distress.  In wheelchair  HENT:  Head: Normocephalic and atraumatic.  Eyes: Pupils are equal, round, and reactive to light. Conjunctivae and EOM are normal.  Neck: Normal range of motion. Neck supple. No thyromegaly present.  Cardiovascular: Normal rate, regular rhythm and normal heart sounds. Exam reveals no gallop and no friction rub.  No murmur heard. Pulmonary/Chest: Effort normal and breath sounds normal. No respiratory distress. She has no wheezes.  Abdominal: Soft. Bowel sounds are normal. She exhibits no distension.  Musculoskeletal: Normal range of motion.  Trace edema  Neurological: She is alert and oriented to person, place, and time. Gait normal.  Skin: Skin is warm and dry. No erythema.  Psychiatric: Mood and affect normal.    Social History   Socioeconomic History  . Marital status: Married    Spouse name: Not on file  . Number of children: Not on file  . Years of education: 47  . Highest education level: Not on file  Occupational History    Employer: UNEMPLOYED  Social Needs  . Financial resource strain: Not on file  . Food insecurity:    Worry: Not on file    Inability: Not on file  . Transportation needs:    Medical: Not on file    Non-medical: Not on file  Tobacco Use  . Smoking status: Current Every Day Smoker    Packs/day: 0.50    Years: 45.00    Pack years: 22.50    Types: Cigarettes  . Smokeless tobacco: Never Used  Substance and Sexual Activity  . Alcohol use: Yes    Alcohol/week: 4.0 standard drinks    Types: 4 Cans of beer per week    Comment: 08/16/2018 "weekends only"  . Drug use: No  . Sexual activity: Yes  Lifestyle  . Physical activity:    Days per week: Not on file    Minutes per session: Not on file  . Stress: Not on file  Relationships  .  Social connections:    Talks on phone: Not on file    Gets together: Not on file    Attends religious service: Not on file    Active member of club or organization: Not on file    Attends meetings of clubs or organizations: Not on file    Relationship status: Not on file  . Intimate partner violence:    Fear of current or ex partner: Not on file    Emotionally abused: Not on file    Physically abused: Not on file    Forced sexual activity: Not on file  Other Topics Concern  .  Not on file  Social History Narrative   Lives in [place] with her husband.   Takes care of 3 grand children.   Trying to find a job, has financial difficulties.          Family History  Problem Relation Age of Onset  . Cancer Mother     Assessment & Plan:   See Encounters Tab for problem based charting.  Patient seen with Dr. Beryle Beams

## 2018-09-20 NOTE — Patient Instructions (Addendum)
Thank you for allowing [place] to provide your care today. Today we discussed your leg/feet swelling and your dizziness    We reviewed your chart and your previous EKG and labs have all been normal. If your dizziness occurs again please make note of any other symptoms you have at that time and what you were doing.  It seems like your leg swelling is related to the prednisone.  Please keep your feet elevated and night. Today we made no changes to your medications.   We have sent in a prescription for a glucometer to your pharmacy.   Please follow-up in 6 months or as needed.    Should you have any questions or concerns please call the internal medicine clinic at [phone].

## 2018-09-21 DIAGNOSIS — R600 Localized edema: Secondary | ICD-10-CM

## 2018-09-21 DIAGNOSIS — R42 Dizziness and giddiness: Secondary | ICD-10-CM | POA: Insufficient documentation

## 2018-09-21 HISTORY — DX: Localized edema: R60.0

## 2018-09-21 NOTE — Assessment & Plan Note (Addendum)
In regards to the lower extremity swelling she reports that this has been present since her last visit. She reports that it has been up to her knees. She reports some mild pain with this. She has been on prednisone for her COPD exacerbation and she finished this on Sunday. She reports that the swelling had gotten worse so she called the clinic yesterday to get an appointment. However since last night and today they swelling has gone down significantly and she reports her legs are back to normal now. She has no history of any heart failure, last Echo showed an EF of 60-65%. She also denies any worsening shortness of breath, palpitations, orthopnea, or PND. On exam she did had trace LE edema, pulmonary and cardiac exam WNL. It appears that this was related to the prednisone that she had been on for her COPD exacerbation. The issues has resolved on it's own and patient was advised to continue to monitor for now.   Plan: -Advised patient to keep legs elevated at night and consume a low sodium diet -No further work up at this time

## 2018-09-21 NOTE — Progress Notes (Signed)
Medicine attending: I personally interviewed and  examined this patient on the day of the patient visit and reviewed pertinent clinical laboratorydata  with resident physician Dr. Lonia Skinner and we discussed a management plan. Patient well known to me. Advanced COPD; type 2 DM. Recent pulse prednisone for flare up of COPD resulted in peripheral edema - now resolved. C/O 2 minute episode of dizziness while standing at her sink. No focal neuro deficit on my exam. Electrolytes normal 10/27. EKG NSR w rare PVC. She did not not palpitations during episode. Advised to call for recurrent episodes. May need Holter monitor.

## 2018-09-21 NOTE — Assessment & Plan Note (Signed)
In regards to her dizziness and chest pain she reports that yesterday afternoon she was standing in her kitchen when she had a short episode of chest pain. The chest pain lasted for 1-2 seconds, located over the left chest, sharp in nature, no radiation, no associated diaphoresis, nausea, or vomiting. She denied any exertion prior to this episode. It has not repeated it since then. This does not seem like angina. She did have some dizziness and light headedness that lasted for about 2 minutes, she needed to go and sit down which resolved this. She reports that this had not happened before. On exam she had no neurologic deficits, cardiac and pulmonary exam were normal. On chart review an EKG done on 10/28 was nearly normal except for a rare PVC. Labs taken 2 weeks ago showed no anemia or electrolyte abnormalities. She does have a history of diabetes however her symptoms did not appear to fit iwht a hypoglycemia episode and she is only on metformin for this. Informed patient of these results and advised her that we will monitor this for now. If the symptoms reoccur we can re-assess and she may need a Holter monitor to assess for arrhythmias.   Plan: -Asked patient to monitor for recurrence -May need a holter monitor in the future is symptoms reoccur

## 2018-10-11 ENCOUNTER — Ambulatory Visit: Payer: Medicare Other | Admitting: Pharmacist

## 2018-10-11 DIAGNOSIS — Z79899 Other long term (current) drug therapy: Secondary | ICD-10-CM

## 2018-10-11 NOTE — Progress Notes (Addendum)
Patient reports for follow up medication help and smoking cessation. Referred patient to Medicare Extra Help program, but she has not completed process. Patient reports she is postponing smoking cessation for now.  She states she was confused and thought she had an [facility] appointment scheduled today. She requested an [facility] appointment for evaluation of boils and pain. Advised patient to contact me if further medication concerns arise.

## 2018-10-12 ENCOUNTER — Ambulatory Visit: Payer: Medicare Other | Admitting: Pharmacist

## 2018-10-13 ENCOUNTER — Other Ambulatory Visit: Payer: Self-pay

## 2018-10-13 ENCOUNTER — Encounter: Payer: Self-pay | Admitting: Internal Medicine

## 2018-10-13 ENCOUNTER — Ambulatory Visit (INDEPENDENT_AMBULATORY_CARE_PROVIDER_SITE_OTHER): Payer: Medicare Other | Admitting: Internal Medicine

## 2018-10-13 VITALS — BP 170/80 | HR 80 | Temp 98.6°F | Ht 66.0 in | Wt 202.1 lb

## 2018-10-13 DIAGNOSIS — L732 Hidradenitis suppurativa: Secondary | ICD-10-CM

## 2018-10-13 DIAGNOSIS — Z8639 Personal history of other endocrine, nutritional and metabolic disease: Secondary | ICD-10-CM | POA: Diagnosis not present

## 2018-10-13 DIAGNOSIS — I10 Essential (primary) hypertension: Secondary | ICD-10-CM

## 2018-10-13 DIAGNOSIS — Z79899 Other long term (current) drug therapy: Secondary | ICD-10-CM | POA: Diagnosis not present

## 2018-10-13 DIAGNOSIS — Z853 Personal history of malignant neoplasm of breast: Secondary | ICD-10-CM

## 2018-10-13 DIAGNOSIS — M1711 Unilateral primary osteoarthritis, right knee: Secondary | ICD-10-CM | POA: Diagnosis not present

## 2018-10-13 DIAGNOSIS — F17210 Nicotine dependence, cigarettes, uncomplicated: Secondary | ICD-10-CM | POA: Diagnosis not present

## 2018-10-13 HISTORY — DX: Hidradenitis suppurativa: L73.2

## 2018-10-13 MED ORDER — CYCLOBENZAPRINE HCL 5 MG PO TABS: 5.0000 mg | ORAL_TABLET | Freq: Three times a day (TID) | ORAL | 0 refills | Status: DC | PRN

## 2018-10-13 MED ORDER — METOPROLOL TARTRATE 25 MG PO TABS: 25.0000 mg | ORAL_TABLET | Freq: Two times a day (BID) | ORAL | 0 refills | Status: DC

## 2018-10-13 MED ORDER — CLINDAMYCIN PHOSPHATE 1 % EX SOLN: Freq: Two times a day (BID) | CUTANEOUS | 1 refills | Status: DC

## 2018-10-13 NOTE — Progress Notes (Signed)
Internal Medicine Clinic Attending  Case discussed with Dr. Helberg at the time of the visit.  We reviewed the resident's history and exam and pertinent patient test results.  I agree with the assessment, diagnosis, and plan of care documented in the resident's note.    

## 2018-10-13 NOTE — Assessment & Plan Note (Signed)
Patient scheduled today for right knee injection; however, unfortunately we do not have anyone in the office today that can perform this for her. She does have significant osteoarthritis of the right knee with palpable crepitus to both active and passive range of motion. He is taking Celebrex for the pain but this is likely not helping with her blood pressure control. Review of most recent basic metabolic function indicates stable creatinine. Review of records indicates that she was on Flexeril on and off through 2016 and 17 I have sent her out 20 tablets to use at night. She will come back on 12/9 in the afternoon her right knee injection.

## 2018-10-13 NOTE — Assessment & Plan Note (Signed)
Presents with a complaint of tender nodules in both her right and left axilla. States that they have been there for months and occasionally one will rupture and let out purulent discharge. She tries not to pop or squeeze them. She has had similar nodules in her groin. She is concerned about these because of her history of diabetes. On physical exam there is multiple palpable nodules in her axilla. There is associated overlying scars from where it looks like previous nodules have ruptured. She does have a history of breast cancer with her most recent mammography being negative. Based on the patient's history and physical exam findings I think that she likely has hidradenitis suppurativa. We discussed good hygiene and topical clindamycin. She will follow-up if these do not improve.

## 2018-10-13 NOTE — Progress Notes (Signed)
   CC: Right knee pain  HPI:  [name] is a 67 y.o. female who presented to the clinic for continued evaluation and management of her chronic medical illnesses. For a detailed assessment and plan please refer to problem based charting below.   Past Medical History:  Diagnosis Date  . Anxiety   . Arthritis    "fingers, knees" (08/16/2018)  . Asthma   . Cancer of right breast ([place]) 1991   s/p lumpectomy, chemotherapy and radiation therapy in 1991. Mammogram in 2007 was normal.  . Constipated    h/o  . COPD (chronic obstructive pulmonary disease) ([place])    History of multiple hospital admissions for exercabation   . COPD with exacerbation ([place]) 04/06/2009   Qualifier: Diagnosis of  By: Eyvonne Mechanic MD, Vijay    . Depression   . Diarrhea    h/o  . GERD (gastroesophageal reflux disease)   . Headache    "a few times/month" (08/16/2018  . Heart murmur 10/05/11   "first time I ever heard I had one was today"  . Hyperlipidemia   . Hypertension   . Obesity   . Personal history of chemotherapy   . Personal history of radiation therapy   . Pneumonia    "couple times in the last 10-15 yrs" (08/16/2018)  . Shortness of breath 10/05/11   "at rest; lying down; w/exertion"  . Sigmoid diverticulitis 80/2008  . Tobacco abuse   . Type II diabetes mellitus ([place])    Review of Systems:  12 point ROS preformed. All negative aside from those mentioned in the HPI.  Physical Exam: Vitals:   10/13/18 0926  BP: (!) 170/80  Pulse: 80  Temp: 98.6 F (37 C)  TempSrc: Oral  SpO2: 100%  Weight: 202 lb 1.6 oz (91.7 kg)  Height: 5\' 6"  (1.676 m)   General: Well nourished female in no acute distress Pulm: Good air movement with no wheezing or crackles  CV: RRR, no murmurs, no rubs, palpable nodules in the bilateral axilla with visible scars  Abdomen: Active bowel sounds, soft, non-distended, no tenderness to palpation Extremities: Palpable crepitus with passive and active range of motion on  the right LE.   Assessment & Plan:   See Encounters Tab for problem based charting.  Patient discussed with Dr. Lynnae January

## 2018-10-13 NOTE — Patient Instructions (Signed)
Thank you for allowing [place] to provide your care. I am sending you out a prescription for the bumps under your arms. You will use this twice daily.  Please come back to the clinic on Monday afternoon to get your knee injection. I apologize for the confusion and frustration.  I will send out a prescription for your blood pressure medicine and your muscle relaxers.

## 2018-10-13 NOTE — Assessment & Plan Note (Signed)
Patient presents for continued evaluation and management of her hypertension. Her blood pressure is not at goal today. Review of records indicates that she is consistently above goal. She tells me that she did not take her metoprolol today because she is out. I have sent a refill for her metoprolol. She will need close follow-up with her primary care doctor to work on getting her blood pressure into goal range.

## 2018-10-16 ENCOUNTER — Other Ambulatory Visit: Payer: Self-pay

## 2018-10-16 ENCOUNTER — Ambulatory Visit (INDEPENDENT_AMBULATORY_CARE_PROVIDER_SITE_OTHER): Payer: Medicare Other | Admitting: Internal Medicine

## 2018-10-16 VITALS — BP 152/72 | HR 94 | Temp 98.9°F | Ht 66.0 in | Wt 202.2 lb

## 2018-10-16 DIAGNOSIS — M1711 Unilateral primary osteoarthritis, right knee: Secondary | ICD-10-CM

## 2018-10-16 NOTE — Progress Notes (Signed)
Patient seen last week for OA, requested knee injection but no one avaliable to prefrom  PROCEDURE NOTE  PROCEDURE: right knee joint steroid injection.  PREOPERATIVE DIAGNOSIS: Osteoarthritis of the right knee.  POSTOPERATIVE DIAGNOSIS: Osteoarthritis of the right knee.  PROCEDURE: The patient was apprised of the risks and the benefits of the procedure and informed consent was obtained. Time-out procedure was performed, with confirmation of the patient's name, date of birth, and correct identification of the right knee to be injected. The patient's knee was then marked at the appropriate site for injection placement. The knee was sterilely prepped with Betadine. A 40 mg (1 milliliter) solution of Kenalog was drawn up into a 5 mL syringe with a 2 mL of 1% lidocaine. The patient was injected with a 27-gauge needle at the lateral aspect of her right flexed knee. There were no complications. The patient tolerated the procedure well. There was minimal bleeding. The patient was instructed to ice her knee upon leaving clinic and refrain from overuse over the next 3 days. The patient was instructed to go to the emergency room with any usual pain, swelling, or redness occurred in the injected area. The patient was given a followup appointment to evaluate response to the injection to his increased range of motion and reduction of pain.

## 2018-10-26 NOTE — Addendum Note (Signed)
Addended by: Hulan Fray on: 10/26/2018 05:19 PM   Modules accepted: Orders

## 2018-11-17 ENCOUNTER — Other Ambulatory Visit: Payer: Self-pay

## 2018-11-17 ENCOUNTER — Ambulatory Visit (INDEPENDENT_AMBULATORY_CARE_PROVIDER_SITE_OTHER): Payer: Medicare Other | Admitting: Internal Medicine

## 2018-11-17 VITALS — BP 170/90 | HR 78 | Temp 98.2°F | Ht 66.0 in | Wt 207.9 lb

## 2018-11-17 DIAGNOSIS — Z79899 Other long term (current) drug therapy: Secondary | ICD-10-CM | POA: Diagnosis not present

## 2018-11-17 DIAGNOSIS — M1711 Unilateral primary osteoarthritis, right knee: Secondary | ICD-10-CM

## 2018-11-17 DIAGNOSIS — G8929 Other chronic pain: Secondary | ICD-10-CM | POA: Diagnosis not present

## 2018-11-17 DIAGNOSIS — I10 Essential (primary) hypertension: Secondary | ICD-10-CM | POA: Diagnosis not present

## 2018-11-17 DIAGNOSIS — F17210 Nicotine dependence, cigarettes, uncomplicated: Secondary | ICD-10-CM | POA: Diagnosis not present

## 2018-11-17 MED ORDER — INDOMETHACIN 25 MG PO CAPS: 25.0000 mg | ORAL_CAPSULE | Freq: Two times a day (BID) | ORAL | 0 refills | Status: DC

## 2018-11-17 MED ORDER — KETOROLAC TROMETHAMINE 30 MG/ML IJ SOLN
30.0000 mg | Freq: Once | INTRAMUSCULAR | Status: AC
  Administered 2018-11-17: 30 mg via INTRAMUSCULAR

## 2018-11-17 NOTE — Assessment & Plan Note (Signed)
HTN: BP elevated today to 182/75 and remained elevated to 170/90 on repeat but endorses that she failed to take her antihypertensives today. I had initially contemplated restarting Norvasc, but given her admission to medication nonadherence feel that this may be contraindicated.    Plan:  Return in one week for BP check Consider initiating Amlodipine 5-10mg  daily at her next visit Continue Losartan-HCTZ 100-25mg  daily Continue Metoprolol 25mg  BID

## 2018-11-17 NOTE — Progress Notes (Signed)
Internal Medicine Clinic Attending  I saw and evaluated the patient.  I personally confirmed the key portions of the history and exam documented by Dr. Harbrecht and I reviewed pertinent patient test results.  The assessment, diagnosis, and plan were formulated together and I agree with the documentation in the resident's note.  

## 2018-11-17 NOTE — Assessment & Plan Note (Addendum)
Right knee pain and edema: Possible five day history of worsening right knee pain and edema. Three year history of right knee pain 2/2 primary osteoarthritis. Underwent her most recent injection on December 9th that initially provided relief but within 1 wk the knee returned to its baseline pain level. No new traumatic event precluding the recent swelling and warmth as per patient. She was, however, on her feet a lot this past Christmas and New years preparing dinner for multiple families which her husband attributes to the increased pain and swelling. Patient denied systemic symptoms including fever, chills, nausea, vomiting, diarrhea, constipation, abdominal pain, or other affected joints. Patient is in a monogamous relationship with her husband.  This does not appear acutely infected nor does she have a history of gout. I do not feel that an arthrocentesis is indicated at this point.  Plan: Toradol 30mg  IM Indomethacin 25mg  BID for 7 days Most recent BMP demonstrating intact renal function w/ Scr <1. Bedside US revealed a mild-moderate sized effusion. Given the modest size of the effusion I feel it would not be most efficacious to attempt an arthrocentesis as it would most likely not provide relief and is not indicated for diagnostic purposes.  We will evaluate this further at her next visit.  She will need to wait until early March prior to repeating an injection. She was given strict return precautions.

## 2018-11-17 NOTE — Progress Notes (Signed)
   CC: right knee pain  HPI:[name] is a 68 y.o. female who presents for evaluation of right knee pain. Please see individual problem based A/P for details.  PHQ-9: Based on the patients    Office Visit from 11/17/2018 in [place]  PHQ-9 Total Score  0     score we have decided to monitor.  Past Medical History:  Diagnosis Date  . Anxiety   . Arthritis    "fingers, knees" (08/16/2018)  . Asthma   . Cancer of right breast ([place]) 1991   s/p lumpectomy, chemotherapy and radiation therapy in 1991. Mammogram in 2007 was normal.  . Constipated    h/o  . COPD (chronic obstructive pulmonary disease) ([place])    History of multiple hospital admissions for exercabation   . COPD with exacerbation ([place]) 04/06/2009   Qualifier: Diagnosis of  By: Eyvonne Mechanic MD, Vijay    . Depression   . Diarrhea    h/o  . GERD (gastroesophageal reflux disease)   . Headache    "a few times/month" (08/16/2018  . Heart murmur 10/05/11   "first time I ever heard I had one was today"  . Hyperlipidemia   . Hypertension   . Obesity   . Personal history of chemotherapy   . Personal history of radiation therapy   . Pneumonia    "couple times in the last 10-15 yrs" (08/16/2018)  . Shortness of breath 10/05/11   "at rest; lying down; w/exertion"  . Sigmoid diverticulitis 80/2008  . Tobacco abuse   . Type II diabetes mellitus ([place])    Review of Systems:  ROS negative except as per HPI.  Physical Exam: Vitals:   11/17/18 0956 11/17/18 1121  BP: (!) 182/75 (!) 170/90  Pulse: 78 78  Temp: 98.2 F (36.8 C)   TempSrc: Oral   SpO2: 100%   Weight: 207 lb 14.4 oz (94.3 kg)   Height: 5\' 6"  (1.676 m)    General: A/O x4, in no acute distress, afebrile, nondiaphoretic MSK: Right knee moderately edematous, not erythematous, non-tender to palpation accept to the lateral aspect. Anterior draw and Lachman's negative, strength intact 5/5 equally bilateral, no gross deformity noted on  exam. Bedside US demonstrated a mild-moderate sized suprapatellar effusion.  Assessment & Plan:   See Encounters Tab for problem based charting.  Patient seen with Dr. Evette Doffing

## 2018-11-17 NOTE — Patient Instructions (Addendum)
FOLLOW-UP INSTRUCTIONS When: Please return in one week for a blood pressure check given the high blood pressure you had today. Please make sure you take your blood pressure medicine before that visit.  What to bring: All of your medications  I have added indomethacin to your medications, please take this two times a day scheduled for the inflammation.    Today we discussed your right knee pain. Please take the indomethacin medication two times per day for seven days. Notify us if the pain worsens, the swelling worsens, you develop a fever or other concerning symptoms.   Thank you for your visit to the [name] today. If you have any questions or concerns please call us at [phone].

## 2018-11-19 ENCOUNTER — Other Ambulatory Visit: Payer: Self-pay | Admitting: Family Medicine

## 2018-11-19 DIAGNOSIS — E118 Type 2 diabetes mellitus with unspecified complications: Secondary | ICD-10-CM

## 2018-11-24 ENCOUNTER — Telehealth: Payer: Self-pay

## 2018-11-24 DIAGNOSIS — E118 Type 2 diabetes mellitus with unspecified complications: Secondary | ICD-10-CM

## 2018-11-24 MED ORDER — METFORMIN HCL 1000 MG PO TABS: 1000.0000 mg | ORAL_TABLET | Freq: Two times a day (BID) | ORAL | 0 refills | Status: DC

## 2018-11-24 NOTE — Telephone Encounter (Signed)
Needs Jan appt ACC or PCP HTN F/U

## 2018-11-24 NOTE — Telephone Encounter (Signed)
metFORMIN (GLUCOPHAGE) 1000 MG tablet, Refill request @ [facility] on [address].

## 2018-11-28 NOTE — Telephone Encounter (Signed)
Appointment scheduled for 12/07/18 at 9:45 a.m.  Patient called but no answer. Left detailed message informing her of the appt next week.

## 2018-12-07 ENCOUNTER — Other Ambulatory Visit: Payer: Self-pay | Admitting: Internal Medicine

## 2018-12-07 ENCOUNTER — Other Ambulatory Visit: Payer: Self-pay

## 2018-12-07 ENCOUNTER — Ambulatory Visit (INDEPENDENT_AMBULATORY_CARE_PROVIDER_SITE_OTHER): Payer: Medicare Other | Admitting: Internal Medicine

## 2018-12-07 VITALS — BP 177/83 | HR 71 | Temp 98.6°F | Ht 66.0 in | Wt 204.0 lb

## 2018-12-07 DIAGNOSIS — H109 Unspecified conjunctivitis: Secondary | ICD-10-CM | POA: Insufficient documentation

## 2018-12-07 DIAGNOSIS — I10 Essential (primary) hypertension: Secondary | ICD-10-CM

## 2018-12-07 DIAGNOSIS — F17210 Nicotine dependence, cigarettes, uncomplicated: Secondary | ICD-10-CM | POA: Diagnosis not present

## 2018-12-07 DIAGNOSIS — Z79899 Other long term (current) drug therapy: Secondary | ICD-10-CM | POA: Diagnosis not present

## 2018-12-07 MED ORDER — AMLODIPINE BESYLATE 5 MG PO TABS: 5.0000 mg | ORAL_TABLET | Freq: Every day | ORAL | 1 refills | Status: DC

## 2018-12-07 MED ORDER — ERYTHROMYCIN 5 MG/GM OP OINT: TOPICAL_OINTMENT | OPHTHALMIC | 1 refills | Status: DC

## 2018-12-07 NOTE — Patient Instructions (Signed)
FOLLOW-UP INSTRUCTIONS When: Please stop at the front desk and schedule an appointment with Dr. Isac Sarna at her next available opening or return in ~4 weeks to [facility], whichever is sooner. What to bring: All of your medications  I have added amlodipine 5mg  daily for your high blood pressure.  Today we discussed your red eye, high blood pressure and leg pain. Please take the amlodipine, the losartan-HCTZ and metoprolol for your blood pressure every day. Please check your pressure at home.  For you leg pain, you may take up to one 220mg  tablet of aleve daily and 650mg  Tylenol up to three times per day as needed.  Thank you for your visit to the [name] today. If you have any questions or concerns please call us at [phone].

## 2018-12-07 NOTE — Assessment & Plan Note (Signed)
Bacterial conjunctivitis: Right eye, stuck shut in am, heavy mucus discharge, no pain or visual impairment, PERRL no associated URI symptoms. Likely Bacterial, will treat with erythromycin OP ointment.

## 2018-12-07 NOTE — Progress Notes (Signed)
Medicine attending: Medical history, presenting problems, physical findings, and medications, reviewed with resident physician Dr Lawrence Harbrecht on the day of the patient visit and I concur with his evaluation and management plan. 

## 2018-12-07 NOTE — Assessment & Plan Note (Signed)
HTN: Patient presents today for BP check and med adjustment. She denied orthostatic symptoms, headache, visual changes, nausea, vomiting, or weakness. Today she endorses that at best she takes her medication 4-5/7 days per week but has taken it the last 4 days. As such, I feel that medication nonadherence is the biggest barrier to her treatment. She endorses that she knows where her medication is and that there is no other barrier except for being forgetful. She will set a reminder.  Plan: Start Amlodipine 5mg  daily Continue Losartan-HCTZ 100-25mg  Daily Continue Metoprolol 25mg  BID Return with her PCP @ next available or in [facility] in ~4 weeks

## 2018-12-07 NOTE — Progress Notes (Signed)
   CC: HTN controll  HPI:[name] is a 68 y.o. female who presents for evaluation of HTN controll. Please see individual problem based A/P for details.   Past Medical History:  Diagnosis Date  . Anxiety   . Arthritis    "fingers, knees" (08/16/2018)  . Asthma   . Cancer of right breast ([place]) 1991   s/p lumpectomy, chemotherapy and radiation therapy in 1991. Mammogram in 2007 was normal.  . Constipated    h/o  . COPD (chronic obstructive pulmonary disease) ([place])    History of multiple hospital admissions for exercabation   . COPD with exacerbation ([place]) 04/06/2009   Qualifier: Diagnosis of  By: Christie Mechanic MD, Vijay    . Depression   . Diarrhea    h/o  . GERD (gastroesophageal reflux disease)   . Headache    "a few times/month" (08/16/2018  . Heart murmur 10/05/11   "first time I ever heard I had one was today"  . Hyperlipidemia   . Hypertension   . Obesity   . Personal history of chemotherapy   . Personal history of radiation therapy   . Pneumonia    "couple times in the last 10-15 yrs" (08/16/2018)  . Shortness of breath 10/05/11   "at rest; lying down; w/exertion"  . Sigmoid diverticulitis 80/2008  . Tobacco abuse   . Type II diabetes mellitus ([place])    Review of Systems:  ROS negative except as per HPI.  Physical Exam: Vitals:   12/07/18 0921  BP: (!) 177/83  Pulse: 71  Temp: 98.6 F (37 C)  TempSrc: Oral  SpO2: 100%  Weight: 204 lb (92.5 kg)  Height: 5\' 6"  (1.676 m)   General: A/O x4, in no acute distress, afebrile, nondiaphoretic HEENT: PEERL, CNII-XII grossly intact, EMO intact, right eye injected mildly, purulent discharge noted, no deformity anterior chamber fluid level.  Cardio: RRR, no mrg's Pulmonary: CTA bilaterally MSK: BLE nontender, nonedematous  Assessment & Plan:   See Encounters Tab for problem based charting.  Patient discussed with Dr. Beryle Beams

## 2018-12-22 ENCOUNTER — Telehealth: Payer: Self-pay

## 2018-12-22 NOTE — Telephone Encounter (Signed)
rtc to pt, she states she has a "stye" in her ear and wants to know what she can do for it this weekend. She is informed without assessing the area imc cannot give her advice. She is ask to go to urg care if pain becomes too much, she is agreeable. Has [facility] appt 2/17 at [facility]

## 2018-12-22 NOTE — Telephone Encounter (Signed)
Needs to speak with a nurse about ear pain.

## 2018-12-25 ENCOUNTER — Ambulatory Visit (INDEPENDENT_AMBULATORY_CARE_PROVIDER_SITE_OTHER): Payer: Medicare Other | Admitting: Internal Medicine

## 2018-12-25 ENCOUNTER — Encounter: Payer: Self-pay | Admitting: Internal Medicine

## 2018-12-25 VITALS — BP 162/78 | HR 110 | Temp 98.1°F | Wt 201.0 lb

## 2018-12-25 DIAGNOSIS — H6192 Disorder of left external ear, unspecified: Secondary | ICD-10-CM | POA: Diagnosis not present

## 2018-12-25 DIAGNOSIS — E118 Type 2 diabetes mellitus with unspecified complications: Secondary | ICD-10-CM

## 2018-12-25 DIAGNOSIS — I10 Essential (primary) hypertension: Secondary | ICD-10-CM

## 2018-12-25 LAB — POCT GLYCOSYLATED HEMOGLOBIN (HGB A1C): Hemoglobin A1C: 5.7 % — AB (ref 4.0–5.6)

## 2018-12-25 LAB — GLUCOSE, CAPILLARY: GLUCOSE-CAPILLARY: 103 mg/dL — AB (ref 70–99)

## 2018-12-25 NOTE — Patient Instructions (Addendum)
[name],   You have a boil in your ear. These can be very painful but usually go away in a few weeks. You can put warm compresses in your ear for 10 to 15 minutes every few hours during the day to help open it up and drain. For the pain, you can use ibuprofen every 8 hours as needed.  Call us if your symptoms do not improve as we will need to refer you to an ear nose and throat doctor.  Please schedule a follow-up visit with me in 4 weeks to check on your blood pressure and diabetes.  -Dr. Frederico Hamman

## 2018-12-25 NOTE — Progress Notes (Signed)
   CC: Ear lesion   HPI:  [name] is a 68 y.o. year-old female with PMH listed below who presents to clinic for evaluation of ear lesion. Please see problem based assessment and plan for further details.   Past Medical History:  Diagnosis Date  . Anxiety   . Arthritis    "fingers, knees" (08/16/2018)  . Asthma   . Cancer of right breast ([place]) 1991   s/p lumpectomy, chemotherapy and radiation therapy in 1991. Mammogram in 2007 was normal.  . Constipated    h/o  . COPD (chronic obstructive pulmonary disease) ([place])    History of multiple hospital admissions for exercabation   . COPD with exacerbation ([place]) 04/06/2009   Qualifier: Diagnosis of  By: Eyvonne Mechanic MD, Vijay    . Depression   . Diarrhea    h/o  . GERD (gastroesophageal reflux disease)   . Headache    "a few times/month" (08/16/2018  . Heart murmur 10/05/11   "first time I ever heard I had one was today"  . Hyperlipidemia   . Hypertension   . Obesity   . Personal history of chemotherapy   . Personal history of radiation therapy   . Pneumonia    "couple times in the last 10-15 yrs" (08/16/2018)  . Shortness of breath 10/05/11   "at rest; lying down; w/exertion"  . Sigmoid diverticulitis 80/2008  . Tobacco abuse   . Type II diabetes mellitus ([place])    Review of Systems:   Review of Systems  Constitutional: Negative for chills, fever and weight loss.  HENT: Positive for ear pain. Negative for congestion, ear discharge, hearing loss and tinnitus.   Skin: Negative for itching and rash.    Physical Exam: Vitals:   12/25/18 0826  BP: (!) 162/78  Pulse: (!) 110  Temp: 98.1 F (36.7 C)  TempSrc: Oral  SpO2: 100%  Weight: 201 lb (91.2 kg)   General: well-appearing female in no acute distress  HENT: NCAT, there is a raised, erythematous lesion surrounding by very small amount of dry blood in the inferior aspect of entrance of ear canal. EOM clear without erythema or exudates and pearly white TM  bilaterally, no lymphadenopathy, OP clear without exudates or erythema   Assessment & Plan:   See Encounters Tab for problem based charting.  Patient discussed with Dr. Angelia Mould

## 2018-12-26 ENCOUNTER — Encounter: Payer: Self-pay | Admitting: Internal Medicine

## 2018-12-26 DIAGNOSIS — H6192 Disorder of left external ear, unspecified: Secondary | ICD-10-CM | POA: Insufficient documentation

## 2018-12-26 DIAGNOSIS — E118 Type 2 diabetes mellitus with unspecified complications: Secondary | ICD-10-CM | POA: Insufficient documentation

## 2018-12-26 NOTE — Assessment & Plan Note (Signed)
A1c obtained today as she has not been able to follow up with me in continuity clinic. A1c 5.7, mildly elevated from 6 months ago. She has had 2 courses of steroids with most recent one in 08/2018. She also skips doses of metformin. Will continue to encourage compliance and focus on lifestyle modifications.

## 2018-12-26 NOTE — Assessment & Plan Note (Signed)
Ms. Zaro presents with complaints of a "boil" in her L ear that has been present for 4 days. She endorses pain and states it radiates to behind her ear. She has not noticed any discharge and denies tinnitus, hearing loss, and congestion. On exam, she does have a painful red lump that appears to be a furuncle at the inferior side of the entrance of the auditory meatus. No erythema or discharge present and TM without bulging or fluid bilaterally. I recommended warm compresses for 10-15 minutes every 2-3 hours to help the furuncle drain which may take several days. If symptoms persists and lesion does not drain, will refer to ENT.

## 2018-12-26 NOTE — Assessment & Plan Note (Signed)
Uncontrolled due to medication non-adherence. BP 162/78 today after skipping her medications yesterday and today because she forgets. She has not set a reminder or alarm to help with this as discussed in previous visit. Asymptomatic at this time. No changes in regimen at this time as her issue is compliance. Will continue to encourage compliance. Will coordinate next appt with Dr. Maudie Mercury to discuss medication management and compliance.

## 2018-12-26 NOTE — Progress Notes (Signed)
Internal Medicine Clinic Attending  Case discussed with Dr. Santos-Sanchez at the time of the visit.  We reviewed the resident's history and exam and pertinent patient test results.  I agree with the assessment, diagnosis, and plan of care documented in the resident's note.    

## 2018-12-29 ENCOUNTER — Encounter: Payer: Self-pay | Admitting: Internal Medicine

## 2018-12-29 ENCOUNTER — Encounter: Payer: Medicare Other | Admitting: Internal Medicine

## 2019-01-01 ENCOUNTER — Encounter (HOSPITAL_COMMUNITY): Payer: Self-pay | Admitting: *Deleted

## 2019-01-01 ENCOUNTER — Other Ambulatory Visit: Payer: Self-pay

## 2019-01-01 ENCOUNTER — Emergency Department (HOSPITAL_COMMUNITY): Payer: Medicare Other

## 2019-01-01 ENCOUNTER — Observation Stay (HOSPITAL_COMMUNITY)
Admission: EM | Admit: 2019-01-01 | Discharge: 2019-01-02 | Disposition: A | Discharge status: Home / Self Care | Payer: Medicare Other | Attending: Internal Medicine | Admitting: Internal Medicine

## 2019-01-01 DIAGNOSIS — Z9221 Personal history of antineoplastic chemotherapy: Secondary | ICD-10-CM | POA: Insufficient documentation

## 2019-01-01 DIAGNOSIS — R9431 Abnormal electrocardiogram [ECG] [EKG]: Secondary | ICD-10-CM | POA: Diagnosis not present

## 2019-01-01 DIAGNOSIS — F410 Panic disorder [episodic paroxysmal anxiety] without agoraphobia: Secondary | ICD-10-CM | POA: Insufficient documentation

## 2019-01-01 DIAGNOSIS — F329 Major depressive disorder, single episode, unspecified: Secondary | ICD-10-CM | POA: Insufficient documentation

## 2019-01-01 DIAGNOSIS — E669 Obesity, unspecified: Secondary | ICD-10-CM | POA: Diagnosis not present

## 2019-01-01 DIAGNOSIS — Z888 Allergy status to other drugs, medicaments and biological substances status: Secondary | ICD-10-CM | POA: Diagnosis not present

## 2019-01-01 DIAGNOSIS — R0602 Shortness of breath: Secondary | ICD-10-CM | POA: Diagnosis not present

## 2019-01-01 DIAGNOSIS — R000 Tachycardia, unspecified: Secondary | ICD-10-CM | POA: Insufficient documentation

## 2019-01-01 DIAGNOSIS — Z923 Personal history of irradiation: Secondary | ICD-10-CM | POA: Insufficient documentation

## 2019-01-01 DIAGNOSIS — Z794 Long term (current) use of insulin: Secondary | ICD-10-CM | POA: Insufficient documentation

## 2019-01-01 DIAGNOSIS — E785 Hyperlipidemia, unspecified: Secondary | ICD-10-CM | POA: Insufficient documentation

## 2019-01-01 DIAGNOSIS — Z853 Personal history of malignant neoplasm of breast: Secondary | ICD-10-CM | POA: Diagnosis not present

## 2019-01-01 DIAGNOSIS — F419 Anxiety disorder, unspecified: Secondary | ICD-10-CM | POA: Insufficient documentation

## 2019-01-01 DIAGNOSIS — Z79899 Other long term (current) drug therapy: Secondary | ICD-10-CM | POA: Diagnosis not present

## 2019-01-01 DIAGNOSIS — J441 Chronic obstructive pulmonary disease with (acute) exacerbation: Secondary | ICD-10-CM | POA: Diagnosis not present

## 2019-01-01 DIAGNOSIS — K219 Gastro-esophageal reflux disease without esophagitis: Secondary | ICD-10-CM | POA: Diagnosis not present

## 2019-01-01 DIAGNOSIS — E119 Type 2 diabetes mellitus without complications: Secondary | ICD-10-CM | POA: Diagnosis not present

## 2019-01-01 DIAGNOSIS — Z6831 Body mass index (BMI) 31.0-31.9, adult: Secondary | ICD-10-CM | POA: Diagnosis not present

## 2019-01-01 DIAGNOSIS — F17210 Nicotine dependence, cigarettes, uncomplicated: Secondary | ICD-10-CM | POA: Insufficient documentation

## 2019-01-01 DIAGNOSIS — J449 Chronic obstructive pulmonary disease, unspecified: Secondary | ICD-10-CM

## 2019-01-01 DIAGNOSIS — I10 Essential (primary) hypertension: Secondary | ICD-10-CM | POA: Insufficient documentation

## 2019-01-01 DIAGNOSIS — R05 Cough: Secondary | ICD-10-CM | POA: Diagnosis not present

## 2019-01-01 DIAGNOSIS — L209 Atopic dermatitis, unspecified: Secondary | ICD-10-CM

## 2019-01-01 DIAGNOSIS — F418 Other specified anxiety disorders: Secondary | ICD-10-CM

## 2019-01-01 DIAGNOSIS — E876 Hypokalemia: Secondary | ICD-10-CM | POA: Insufficient documentation

## 2019-01-01 HISTORY — DX: Chronic obstructive pulmonary disease with (acute) exacerbation: J44.1

## 2019-01-01 LAB — CBC WITH DIFFERENTIAL/PLATELET
ABS IMMATURE GRANULOCYTES: 0.02 10*3/uL (ref 0.00–0.07)
Basophils Absolute: 0 10*3/uL (ref 0.0–0.1)
Basophils Relative: 1 %
Eosinophils Absolute: 0.4 10*3/uL (ref 0.0–0.5)
Eosinophils Relative: 6 %
HCT: 40.4 % (ref 36.0–46.0)
HEMOGLOBIN: 13.8 g/dL (ref 12.0–15.0)
Immature Granulocytes: 0 %
LYMPHS PCT: 29 %
Lymphs Abs: 1.7 10*3/uL (ref 0.7–4.0)
MCH: 30.2 pg (ref 26.0–34.0)
MCHC: 34.2 g/dL (ref 30.0–36.0)
MCV: 88.4 fL (ref 80.0–100.0)
Monocytes Absolute: 0.4 10*3/uL (ref 0.1–1.0)
Monocytes Relative: 7 %
Neutro Abs: 3.3 10*3/uL (ref 1.7–7.7)
Neutrophils Relative %: 57 %
Platelets: 293 10*3/uL (ref 150–400)
RBC: 4.57 MIL/uL (ref 3.87–5.11)
RDW: 12.2 % (ref 11.5–15.5)
WBC: 5.8 10*3/uL (ref 4.0–10.5)
nRBC: 0 % (ref 0.0–0.2)

## 2019-01-01 LAB — BASIC METABOLIC PANEL
Anion gap: 16 — ABNORMAL HIGH (ref 5–15)
BUN: 9 mg/dL (ref 8–23)
CHLORIDE: 100 mmol/L (ref 98–111)
CO2: 21 mmol/L — ABNORMAL LOW (ref 22–32)
Calcium: 9.4 mg/dL (ref 8.9–10.3)
Creatinine, Ser: 0.94 mg/dL (ref 0.44–1.00)
GFR calc Af Amer: >60 mL/min (ref >60)
GFR calc non Af Amer: >60 mL/min (ref >60)
Glucose, Bld: 118 mg/dL — ABNORMAL HIGH (ref 70–99)
Potassium: 2.8 mmol/L — ABNORMAL LOW (ref 3.5–5.1)
SODIUM: 137 mmol/L (ref 135–145)

## 2019-01-01 LAB — INFLUENZA PANEL BY PCR (TYPE A & B)
INFLAPCR: NEGATIVE
Influenza B By PCR: NEGATIVE

## 2019-01-01 LAB — MAGNESIUM: Magnesium: 1.7 mg/dL (ref 1.7–2.4)

## 2019-01-01 MED ORDER — FLUTICASONE-UMECLIDIN-VILANT 100-62.5-25 MCG/INH IN AEPB: 1.0000 | INHALATION_SPRAY | Freq: Every day | RESPIRATORY_TRACT | Status: DC

## 2019-01-01 MED ORDER — POTASSIUM CHLORIDE CRYS ER 20 MEQ PO TBCR
40.0000 meq | EXTENDED_RELEASE_TABLET | Freq: Once | ORAL | Status: AC
  Administered 2019-01-01: 40 meq via ORAL
  Filled 2019-01-01: qty 2

## 2019-01-01 MED ORDER — INSULIN ASPART 100 UNIT/ML ~~LOC~~ SOLN
0.0000 [IU] | Freq: Every day | SUBCUTANEOUS | Status: DC
  Administered 2019-01-02: 2 [IU] via SUBCUTANEOUS

## 2019-01-01 MED ORDER — ENOXAPARIN SODIUM 40 MG/0.4ML ~~LOC~~ SOLN
40.0000 mg | SUBCUTANEOUS | Status: DC
  Administered 2019-01-02: 40 mg via SUBCUTANEOUS
  Filled 2019-01-01: qty 0.4

## 2019-01-01 MED ORDER — GUAIFENESIN-DM 100-10 MG/5ML PO SYRP
5.0000 mL | ORAL_SOLUTION | ORAL | Status: DC | PRN
  Administered 2019-01-02: 5 mL via ORAL
  Filled 2019-01-01: qty 5

## 2019-01-01 MED ORDER — IPRATROPIUM BROMIDE 0.02 % IN SOLN
0.5000 mg | Freq: Once | RESPIRATORY_TRACT | Status: AC
  Administered 2019-01-01: 0.5 mg via RESPIRATORY_TRACT
  Filled 2019-01-01: qty 2.5

## 2019-01-01 MED ORDER — FLUTICASONE PROPIONATE 50 MCG/ACT NA SUSP
1.0000 | Freq: Every day | NASAL | Status: DC
  Administered 2019-01-02: 1 via NASAL
  Filled 2019-01-01: qty 16

## 2019-01-01 MED ORDER — LORAZEPAM 2 MG/ML IJ SOLN
1.0000 mg | Freq: Once | INTRAMUSCULAR | Status: AC
  Administered 2019-01-01: 1 mg via INTRAVENOUS
  Filled 2019-01-01: qty 1

## 2019-01-01 MED ORDER — ACETAMINOPHEN 325 MG PO TABS
650.0000 mg | ORAL_TABLET | Freq: Four times a day (QID) | ORAL | Status: DC | PRN
  Administered 2019-01-02: 650 mg via ORAL
  Filled 2019-01-01: qty 2

## 2019-01-01 MED ORDER — ALBUTEROL SULFATE (2.5 MG/3ML) 0.083% IN NEBU
5.0000 mg | INHALATION_SOLUTION | Freq: Once | RESPIRATORY_TRACT | Status: AC
  Administered 2019-01-01: 5 mg via RESPIRATORY_TRACT
  Filled 2019-01-01: qty 6

## 2019-01-01 MED ORDER — METOPROLOL TARTRATE 25 MG PO TABS
25.0000 mg | ORAL_TABLET | Freq: Two times a day (BID) | ORAL | Status: DC
  Administered 2019-01-02 (×2): 25 mg via ORAL
  Filled 2019-01-01 (×2): qty 1

## 2019-01-01 MED ORDER — PREDNISONE 20 MG PO TABS
40.0000 mg | ORAL_TABLET | Freq: Every day | ORAL | Status: DC
  Administered 2019-01-02: 40 mg via ORAL
  Filled 2019-01-01: qty 2

## 2019-01-01 MED ORDER — LORAZEPAM 1 MG PO TABS
1.0000 mg | ORAL_TABLET | ORAL | Status: AC | PRN
  Administered 2019-01-02 (×2): 1 mg via ORAL
  Filled 2019-01-01 (×2): qty 1

## 2019-01-01 MED ORDER — LOSARTAN POTASSIUM-HCTZ 100-25 MG PO TABS: 1.0000 | ORAL_TABLET | Freq: Every day | ORAL | Status: DC

## 2019-01-01 MED ORDER — IPRATROPIUM-ALBUTEROL 0.5-2.5 (3) MG/3ML IN SOLN
3.0000 mL | Freq: Four times a day (QID) | RESPIRATORY_TRACT | Status: DC | PRN
  Administered 2019-01-02: 3 mL via RESPIRATORY_TRACT
  Filled 2019-01-01: qty 3

## 2019-01-01 MED ORDER — SIMVASTATIN 20 MG PO TABS: 40.0000 mg | ORAL_TABLET | Freq: Every day | ORAL | Status: DC

## 2019-01-01 MED ORDER — METHYLPREDNISOLONE SODIUM SUCC 125 MG IJ SOLR
125.0000 mg | Freq: Once | INTRAMUSCULAR | Status: AC
  Administered 2019-01-01: 125 mg via INTRAVENOUS
  Filled 2019-01-01: qty 2

## 2019-01-01 MED ORDER — POTASSIUM CHLORIDE 10 MEQ/100ML IV SOLN
10.0000 meq | INTRAVENOUS | Status: AC
  Administered 2019-01-02 (×6): 10 meq via INTRAVENOUS
  Filled 2019-01-01 (×5): qty 100

## 2019-01-01 MED ORDER — INSULIN ASPART 100 UNIT/ML ~~LOC~~ SOLN
0.0000 [IU] | Freq: Three times a day (TID) | SUBCUTANEOUS | Status: DC
  Administered 2019-01-02: 11 [IU] via SUBCUTANEOUS
  Administered 2019-01-02: 5 [IU] via SUBCUTANEOUS

## 2019-01-01 MED ORDER — SENNOSIDES-DOCUSATE SODIUM 8.6-50 MG PO TABS: 1.0000 | ORAL_TABLET | Freq: Every evening | ORAL | Status: DC | PRN

## 2019-01-01 MED ORDER — AMLODIPINE BESYLATE 5 MG PO TABS
5.0000 mg | ORAL_TABLET | Freq: Every day | ORAL | Status: DC
  Administered 2019-01-02: 5 mg via ORAL
  Filled 2019-01-01 (×2): qty 1

## 2019-01-01 MED ORDER — ACETAMINOPHEN 650 MG RE SUPP: 650.0000 mg | Freq: Four times a day (QID) | RECTAL | Status: DC | PRN

## 2019-01-01 MED ORDER — ALBUTEROL (5 MG/ML) CONTINUOUS INHALATION SOLN
10.0000 mg/h | INHALATION_SOLUTION | Freq: Once | RESPIRATORY_TRACT | Status: AC
  Administered 2019-01-01: 10 mg/h via RESPIRATORY_TRACT
  Filled 2019-01-01: qty 20

## 2019-01-01 MED ORDER — LEVALBUTEROL HCL 0.63 MG/3ML IN NEBU
0.6300 mg | INHALATION_SOLUTION | Freq: Once | RESPIRATORY_TRACT | Status: AC
  Administered 2019-01-01: 0.63 mg via RESPIRATORY_TRACT
  Filled 2019-01-01: qty 3

## 2019-01-01 MED ORDER — AZITHROMYCIN 250 MG PO TABS
500.0000 mg | ORAL_TABLET | Freq: Every day | ORAL | Status: DC
  Administered 2019-01-02 (×2): 500 mg via ORAL
  Filled 2019-01-01 (×2): qty 2

## 2019-01-01 MED ORDER — MAGNESIUM SULFATE 2 GM/50ML IV SOLN
2.0000 g | Freq: Once | INTRAVENOUS | Status: AC
  Administered 2019-01-01: 2 g via INTRAVENOUS
  Filled 2019-01-01: qty 50

## 2019-01-01 MED ORDER — IPRATROPIUM-ALBUTEROL 0.5-2.5 (3) MG/3ML IN SOLN: 3.0000 mL | Freq: Four times a day (QID) | RESPIRATORY_TRACT | Status: DC

## 2019-01-01 NOTE — ED Provider Notes (Signed)
[facility] EMERGENCY DEPARTMENT Provider Note   CSN: [id number] Arrival date & time: 01/01/19  1857  LEVEL 5 CAVEAT - RESPIRATORY DISTRESS  History   Chief Complaint Chief Complaint  Patient presents with  . Shortness of Breath    HPI [name] is a 68 y.o. female.     HPI  68 year old female presents with dyspnea.  History is very limited as she is short of breath and can only speak in short phrases.  Has been feeling dyspneic for about 3 days.  Feels similar to prior COPD.  Reports subjective fever and is having a lot of nasal congestion and rhinorrhea.  No chest pain.  Past Medical History:  Diagnosis Date  . Anxiety   . Arthritis    "fingers, knees" (08/16/2018)  . Asthma   . Cancer of right breast ([place]) 1991   s/p lumpectomy, chemotherapy and radiation therapy in 1991. Mammogram in 2007 was normal.  . Constipated    h/o  . COPD (chronic obstructive pulmonary disease) ([place])    History of multiple hospital admissions for exercabation   . COPD with exacerbation ([place]) 04/06/2009   Qualifier: Diagnosis of  By: Eyvonne Mechanic MD, Vijay    . Depression   . Diarrhea    h/o  . GERD (gastroesophageal reflux disease)   . Headache    "a few times/month" (08/16/2018  . Heart murmur 10/05/11   "first time I ever heard I had one was today"  . Hyperlipidemia   . Hypertension   . Obesity   . Personal history of chemotherapy   . Personal history of radiation therapy   . Pneumonia    "couple times in the last 10-15 yrs" (08/16/2018)  . Shortness of breath 10/05/11   "at rest; lying down; w/exertion"  . Sigmoid diverticulitis 80/2008  . Tobacco abuse   . Type II diabetes mellitus [facility])     Patient Active Problem List   Diagnosis Date Noted  . Earlobe lesion, left 12/26/2018  . Type 2 diabetes mellitus with complication, without long-term current use of insulin ([place]) 12/26/2018  . Axillary hidradenitis suppurativa 10/13/2018  . Dizziness 09/21/2018  .  Lower extremity edema 09/21/2018  . Irregular cardiac rhythm 08/25/2018  . Chronic recurrent sinusitis   . Alopecia of scalp 07/25/2018  . Bruising 06/21/2018  . Weight loss 06/21/2018  . Itching 06/21/2018  . Atopic dermatitis 03/20/2018  . Seasonal allergies 02/25/2017  . Chronic obstructive airway disease with asthma ([place]) 12/24/2014  . QT prolongation 08/08/2014  . Dyslipidemia associated with type 2 diabetes mellitus ([place]) 06/14/2014  . History of breast cancer 12/01/2012  . Primary osteoarthritis of right knee 09/29/2012  . GAD (generalized anxiety disorder) 12/30/2009  . Type 2 diabetes mellitus ([place]) 05/14/2009  . TOBACCO ABUSE 04/06/2009  . Hypertension, benign essential, goal below 140/90 04/06/2009    Past Surgical History:  Procedure Laterality Date  . ABDOMINAL HYSTERECTOMY    . ANTERIOR CERVICAL DECOMP/DISCECTOMY FUSION  2012   "Dr. Lynann Bologna  put plate in; did something to my vertebrae"  . BACK SURGERY    . BREAST LUMPECTOMY Right 1991  . DOBUTAMINE STRESS ECHO  08/2004   Inferior ischemia, normal LV systolic function, no significant CAD     OB History   No obstetric history on file.      Home Medications    Prior to Admission medications   Medication Sig Start Date End Date Taking? Authorizing Provider  ACCU-CHEK FASTCLIX LANCETS MISC 1 each  by Other route See admin instructions. Check blood sugar daily as needed for high blood sugar.    [provider]  acetaminophen (TYLENOL) 500 MG tablet Take 1,000 mg by mouth every 6 (six) hours as needed for headache (pain).    [provider]  albuterol (PROVENTIL HFA;VENTOLIN HFA) 108 (90 Base) MCG/ACT inhaler Inhale 2 puffs into the lungs every 6 (six) hours as needed for wheezing or shortness of breath. 08/10/18   Forde Dandy, PharmD  amLODipine (NORVASC) 5 MG tablet Take 1 tablet (5 mg total) by mouth daily. 12/07/18   Kathi Ludwig, MD  Blood Glucose Monitoring Suppl (ACCU-CHEK NANO  SMARTVIEW) w/Device KIT 1 each by Other route See admin instructions. Check blood sugar up to 4 times daily as needed for high blood sugar. Code: E-11.9 09/20/18   Asencion Noble, MD  buPROPion (WELLBUTRIN XL) 300 MG 24 hr tablet Take 1 tablet (300 mg total) by mouth daily. Patient not taking: Reported on 09/03/2018 08/08/18   Maczis, Barth Kirks, PA-C  cetirizine (ZYRTEC) 10 MG tablet Take 1 tablet (10 mg total) by mouth daily. Patient not taking: Reported on 09/03/2018 08/08/18   Maczis, Barth Kirks, PA-C  chlorpheniramine-HYDROcodone (TUSSIONEX) 10-8 MG/5ML SUER Take 5 mLs by mouth every 12 (twelve) hours. 09/06/18   Asencion Noble, MD  clindamycin (CLEOCIN-T) 1 % external solution Apply topically 2 (two) times daily. 10/13/18   Ina Homes, MD  cyclobenzaprine (FLEXERIL) 5 MG tablet Take 1 tablet (5 mg total) by mouth 3 (three) times daily as needed for muscle spasms. 10/13/18   Ina Homes, MD  diclofenac sodium (VOLTAREN) 1 % GEL Apply 2 g topically 2 (two) times daily as needed. Patient taking differently: Apply 2 g topically 2 (two) times daily as needed (knee pain).  06/21/18   Welford Roche, MD  erythromycin ophthalmic ointment Please place one drop in the right eye four times daily. 12/07/18   Kathi Ludwig, MD  Fluticasone-Umeclidin-Vilant (TRELEGY ELLIPTA) 100-62.5-25 MCG/INH AEPB Inhale 1 puff into the lungs daily.     [provider]  glucose blood (ACCU-CHEK SMARTVIEW) test strip Use to check blood sugar 1 to 2 times daily. diag code E 11.9. Non- insulin dependent 10/02/14   Sid Falcon, MD  hydrOXYzine (ATARAX/VISTARIL) 10 MG tablet Take 1 tablet (10 mg total) by mouth 3 (three) times daily as needed for itching or anxiety. 04/27/18   Lenore Cordia, MD  indomethacin (INDOCIN) 25 MG capsule Take 1 capsule (25 mg total) by mouth 2 (two) times daily. 11/17/18   Kathi Ludwig, MD  ipratropium-albuterol (DUONEB) 0.5-2.5 (3) MG/3ML SOLN Take 3 mLs by  nebulization every 6 (six) hours as needed. Patient taking differently: Take 3 mLs by nebulization every 6 (six) hours as needed (shortness of breath/wheezing).  08/24/18   Seawell, Jaimie A, DO  levocetirizine (XYZAL) 5 MG tablet Take 2 x a day for the itching Patient taking differently: Take 5 mg by mouth 2 (two) times daily as needed (itching).  04/09/18   Raylene Everts, MD  losartan-hydrochlorothiazide (HYZAAR) 100-25 MG tablet TAKE 1 TABLET BY MOUTH EVERY DAY Patient taking differently: Take 1 tablet by mouth daily.  03/10/18   Santos-Sanchez, Merlene Morse, MD  metFORMIN (GLUCOPHAGE) 1000 MG tablet Take 1 tablet (1,000 mg total) by mouth 2 (two) times daily. 11/24/18   Bartholomew Crews, MD  metoprolol tartrate (LOPRESSOR) 25 MG tablet Take 1 tablet (25 mg total) by mouth 2 (two) times daily. 10/13/18   Helberg,  Larkin Ina, MD  predniSONE (DELTASONE) 20 MG tablet Take 2 tablets (40 mg total) by mouth daily with breakfast. 09/14/18   Asencion Noble, MD  RA ASPIRIN EC ADULT LOW ST 81 MG EC tablet take 1 tablet by mouth once daily Patient taking differently: Take 81 mg by mouth daily.  01/04/17   Asencion Partridge, MD  simvastatin (ZOCOR) 40 MG tablet Take 1 tablet (40 mg total) by mouth daily at 6 PM. 03/16/18   Rice, Resa Miner, MD    Family History Family History  Problem Relation Age of Onset  . Cancer Mother     Social History Social History   Tobacco Use  . Smoking status: Current Every Day Smoker    Packs/day: 0.50    Years: 45.00    Pack years: 22.50    Types: Cigarettes  . Smokeless tobacco: Never Used  Substance Use Topics  . Alcohol use: Yes    Alcohol/week: 4.0 standard drinks    Types: 4 Cans of beer per week    Comment: 08/16/2018 "weekends only"  . Drug use: No     Allergies   Ace inhibitors and Flonase [fluticasone]   Review of Systems Review of Systems  Unable to perform ROS: Severe respiratory distress     Physical Exam Updated Vital Signs BP (!) 142/74    Pulse (!) 107   Temp 98.1 F (36.7 C) (Oral)   Resp (!) 25   SpO2 100%   Physical Exam Vitals signs and nursing note reviewed.  Constitutional:      General: She is in acute distress.     Appearance: She is well-developed. She is obese. She is not ill-appearing or diaphoretic.  HENT:     Head: Normocephalic and atraumatic.     Right Ear: External ear normal.     Left Ear: External ear normal.     Nose: Nose normal.  Eyes:     General:        Right eye: No discharge.        Left eye: No discharge.  Cardiovascular:     Rate and Rhythm: Regular rhythm. Tachycardia present.     Heart sounds: Normal heart sounds.  Pulmonary:     Effort: Tachypnea present.     Breath sounds: Wheezing present.  Abdominal:     Palpations: Abdomen is soft.     Tenderness: There is no abdominal tenderness.  Skin:    General: Skin is warm and dry.  Neurological:     Mental Status: She is alert.  Psychiatric:        Mood and Affect: Mood is anxious.      ED Treatments / Results  Labs (all labs ordered are listed, but only abnormal results are displayed) Labs Reviewed  BASIC METABOLIC PANEL - Abnormal; Notable for the following components:      Result Value   Potassium 2.8 (*)    CO2 21 (*)    Glucose, Bld 118 (*)    Anion gap 16 (*)    All other components within normal limits  CBC WITH DIFFERENTIAL/PLATELET  INFLUENZA PANEL BY PCR (TYPE A & B)    EKG EKG Interpretation  Date/Time:  Monday January 01 2019 19:01:20 EST Ventricular Rate:  121 PR Interval:  132 QRS Duration: 88 QT Interval:  340 QTC Calculation: 482 R Axis:   76 Text Interpretation:  Sinus tachycardia Biatrial enlargement Pulmonary disease pattern Septal infarct , age undetermined Abnormal ECG no significant change since Oct 2019  Confirmed by Sherwood Gambler [phone]) on 01/01/2019 8:36:58 PM   Radiology Dg Chest 2 View  Result Date: 01/01/2019 CLINICAL DATA:  Initial evaluation for acute chest tightness,  shortness of breath, productive cough. EXAM: CHEST - 2 VIEW COMPARISON:  Prior radiograph from 09/03/2018 FINDINGS: Cardiac and mediastinal silhouettes are stable in size and contour, and remain within normal limits. Lungs are hyperinflated with probable emphysematous changes. No focal infiltrates. No edema or effusion. No pneumothorax. No acute osseous abnormality. Surgical clips overlie the right axilla. Cervical ACDF noted. IMPRESSION: 1. Hyperinflation with COPD. 2. No other superimposed active cardiopulmonary disease. Electronically Signed   By: Jeannine Boga M.D.   On: 01/01/2019 19:58    Procedures .Critical Care Performed by: Sherwood Gambler, MD Authorized by: Sherwood Gambler, MD   Critical care provider statement:    Critical care time (minutes):  30   Critical care time was exclusive of:  Separately billable procedures and treating other patients   Critical care was necessary to treat or prevent imminent or life-threatening deterioration of the following conditions:  Respiratory failure   Critical care was time spent personally by me on the following activities:  Development of treatment plan with patient or surrogate, discussions with consultants, evaluation of patient's response to treatment, examination of patient, obtaining history from patient or surrogate, ordering and performing treatments and interventions, ordering and review of laboratory studies, ordering and review of radiographic studies, pulse oximetry, re-evaluation of patient's condition and review of old charts   (including critical care time)  Medications Ordered in ED Medications  levalbuterol (XOPENEX) nebulizer solution 0.63 mg (0.63 mg Nebulization Given 01/01/19 1910)  albuterol (PROVENTIL) (2.5 MG/3ML) 0.083% nebulizer solution 5 mg (5 mg Nebulization Given 01/01/19 1959)  ipratropium (ATROVENT) nebulizer solution 0.5 mg (0.5 mg Nebulization Given 01/01/19 1959)  methylPREDNISolone sodium succinate  (SOLU-MEDROL) 125 mg/2 mL injection 125 mg (125 mg Intravenous Given 01/01/19 2024)  LORazepam (ATIVAN) injection 1 mg (1 mg Intravenous Given 01/01/19 2024)  albuterol (PROVENTIL,VENTOLIN) solution continuous neb (10 mg/hr Nebulization Given 01/01/19 2038)  ipratropium (ATROVENT) nebulizer solution 0.5 mg (0.5 mg Nebulization Given 01/01/19 2037)  magnesium sulfate IVPB 2 g 50 mL (0 g Intravenous Stopped 01/01/19 2212)  potassium chloride SA (K-DUR,KLOR-CON) CR tablet 40 mEq (40 mEq Oral Given 01/01/19 2127)     Initial Impression / Assessment and Plan / ED Course  I have reviewed the triage vital signs and the nursing notes.  Pertinent labs & imaging results that were available during my care of the patient were reviewed by me and considered in my medical decision making (see chart for details).        Patient presents with recurrent COPD exacerbation.  She is quite anxious and dyspneic at the first, unclear how much is anxiety and how much is respiratory distress.  She was given hour-long breathing treatment plus other albuterol she got prior to me seeing her.  There is no influenza and no bacterial pneumonia.  She is feeling better after these treatments plus some mild IV Ativan to help with the anxiety.  However she still has some increased work of breathing and some wheezing.  I do not think she stable enough to go home.  Internal medicine teaching service to admit.  Final Clinical Impressions(s) / ED Diagnoses   Final diagnoses:  COPD exacerbation ([place])  Hypokalemia    ED Discharge Orders    None       Sherwood Gambler, MD 01/01/19 2251

## 2019-01-01 NOTE — H&P (Addendum)
Date: 01/01/2019               Patient Name:  [name] MRN: [id number]  DOB: 07/19/1951 Age / Sex: 68 y.o., female   PCP: Welford Roche, MD         Medical Service: Internal Medicine Teaching Service         Attending Physician: Dr. Aldine Contes, MD    First Contact: Dr. Sherry Ruffing Pager: [phone]  Second Contact: Dr. Shan Levans Pager: [phone]       After Hours (After 5p/  First Contact Pager: [phone]  weekends / holidays): Second Contact Pager: [phone]   Chief Complaint: Shortness of breath  History of Present Illness: [name] is a 68 year old female with COPD, HTN, type II DM, and anxiety presenting with shortness of breath for the past week. She noticed her breathing worsened a few weeks ago after a few sick family members visited her and she felt she may have caught something. She was using her breathing treatments but has been out of her rescue inhaler for a few weeks. She went to work with her husband today to keep him company and started feeling very anxious due to her breathing. She planned on coming to the clinic Wednesday but she felt so SOB and congested so she came to the ED. She reports cold sweats, nasal congestion, rhinorrhea and productive cough with yellow/green sputum. She has also had some substernal chest pain and described it as a burning sensation. She has also been out of her anxiety medications and unable to afford them til next month. She has never required supplemental oxygen. She lives with her husband but he is usually at work and she gets anxiety from being home alone when she has having difficulty breathing.  ED course: On arrival, she was hypertensive, mildly tachypneic and tachycardic. Afebrile, saturating well on room air. Influenza panel was negative. K 2.8. Chest xray showed hyperinflation with COPD, no other superimposed active cardiopulmonary disease. She was given breathing treatments, IV solumedrol, ativan, oral  potassium and IV magnesium.   Meds:  Current Meds  Medication Sig  . acetaminophen (TYLENOL) 500 MG tablet Take 1,000 mg by mouth every 6 (six) hours as needed for headache (pain).  Marland Kitchen albuterol (PROVENTIL HFA;VENTOLIN HFA) 108 (90 Base) MCG/ACT inhaler Inhale 2 puffs into the lungs every 6 (six) hours as needed for wheezing or shortness of breath.  Marland Kitchen amLODipine (NORVASC) 5 MG tablet Take 1 tablet (5 mg total) by mouth daily.  . Fluticasone-Umeclidin-Vilant (TRELEGY ELLIPTA) 100-62.5-25 MCG/INH AEPB Inhale 1 puff into the lungs daily.   . hydrOXYzine (ATARAX/VISTARIL) 10 MG tablet Take 1 tablet (10 mg total) by mouth 3 (three) times daily as needed for itching or anxiety.  Marland Kitchen ipratropium-albuterol (DUONEB) 0.5-2.5 (3) MG/3ML SOLN Take 3 mLs by nebulization every 6 (six) hours as needed. (Patient taking differently: Take 3 mLs by nebulization every 6 (six) hours as needed (shortness of breath/wheezing). )  . losartan-hydrochlorothiazide (HYZAAR) 100-25 MG tablet TAKE 1 TABLET BY MOUTH EVERY DAY (Patient taking differently: Take 1 tablet by mouth daily. )  . metFORMIN (GLUCOPHAGE) 1000 MG tablet Take 1 tablet (1,000 mg total) by mouth 2 (two) times daily.  . metoprolol tartrate (LOPRESSOR) 25 MG tablet Take 1 tablet (25 mg total) by mouth 2 (two) times daily.  . simvastatin (ZOCOR) 40 MG tablet Take 1 tablet (40 mg total) by mouth daily at 6 PM.     Allergies: Allergies as  of 01/01/2019 - Review Complete 01/01/2019  Allergen Reaction Noted  . Ace inhibitors Swelling 12/26/2014  . Flonase [fluticasone] Other (See Comments) 12/25/2014   Past Medical History:  Diagnosis Date  . Anxiety   . Arthritis    "fingers, knees" (08/16/2018)  . Asthma   . Cancer of right breast ([place]) 1991   s/p lumpectomy, chemotherapy and radiation therapy in 1991. Mammogram in 2007 was normal.  . Constipated    h/o  . COPD (chronic obstructive pulmonary disease) ([place])    History of multiple hospital admissions for  exercabation   . COPD with exacerbation ([place]) 04/06/2009   Qualifier: Diagnosis of  By: Eyvonne Mechanic MD, Vijay    . Depression   . Diarrhea    h/o  . GERD (gastroesophageal reflux disease)   . Headache    "a few times/month" (08/16/2018  . Heart murmur 10/05/11   "first time I ever heard I had one was today"  . Hyperlipidemia   . Hypertension   . Obesity   . Personal history of chemotherapy   . Personal history of radiation therapy   . Pneumonia    "couple times in the last 10-15 yrs" (08/16/2018)  . Shortness of breath 10/05/11   "at rest; lying down; w/exertion"  . Sigmoid diverticulitis 80/2008  . Tobacco abuse   . Type II diabetes mellitus ([facility])     Family History:  Family History  Problem Relation Age of Onset  . Cancer Mother    Social History:  She lives at home with her husband. Able to perform her ADL's. She smokes 1 pack of cigarrettes every two days for 30+ years. She drinks half a case of beer on the weekends and liquor occasionally. Denies hx of withdrawals. Denies any illicit drug use.   Social History   Socioeconomic History  . Marital status: Married    Spouse name: Not on file  . Number of children: Not on file  . Years of education: 31  . Highest education level: Not on file  Occupational History    Employer: UNEMPLOYED  Social Needs  . Financial resource strain: Not on file  . Food insecurity:    Worry: Not on file    Inability: Not on file  . Transportation needs:    Medical: Not on file    Non-medical: Not on file  Tobacco Use  . Smoking status: Current Every Day Smoker    Packs/day: 0.50    Years: 45.00    Pack years: 22.50    Types: Cigarettes  . Smokeless tobacco: Never Used  Substance and Sexual Activity  . Alcohol use: Yes    Alcohol/week: 4.0 standard drinks    Types: 4 Cans of beer per week    Comment: 08/16/2018 "weekends only"  . Drug use: No  . Sexual activity: Yes  Lifestyle  . Physical activity:    Days per week: Not on file      Minutes per session: Not on file  . Stress: Not on file  Relationships  . Social connections:    Talks on phone: Not on file    Gets together: Not on file    Attends religious service: Not on file    Active member of club or organization: Not on file    Attends meetings of clubs or organizations: Not on file    Relationship status: Not on file  . Intimate partner violence:    Fear of current or ex partner: Not on file  Emotionally abused: Not on file    Physically abused: Not on file    Forced sexual activity: Not on file  Other Topics Concern  . Not on file  Social History Narrative   Lives in [place] with her husband.   Takes care of 3 grand children.   Trying to find a job, has financial difficulties.         Review of Systems: A complete ROS was negative except as per HPI.   Physical Exam: Blood pressure (!) 153/82, pulse (!) 127, temperature 98.1 F (36.7 C), temperature source Oral, resp. rate (!) 21, SpO2 100 %.  Physical Exam  Constitutional: She is oriented to person, place, and time.  Anxious, tearful on exam, tremulous UE  HENT:  Right Ear: External ear normal.  Left Ear: External ear normal.  Nose: Nose normal.  Mouth/Throat: No oropharyngeal exudate.  Left ear boil  Eyes: Conjunctivae are normal.  Cardiovascular: Regular rhythm and normal heart sounds. Tachycardia present.  No murmur heard. Pulmonary/Chest: Effort normal. No respiratory distress. She has wheezes. She has no rales.  Mild upper lobe expiratory wheezing bilaterally   Abdominal: Soft. Bowel sounds are normal. She exhibits no distension. There is no abdominal tenderness.  Musculoskeletal:        General: No edema.  Neurological: She is alert and oriented to person, place, and time.  Skin: Skin is warm and dry. She is not diaphoretic.  Psychiatric: Memory, affect and judgment normal.    EKG: personally reviewed my interpretation is sinus tachycardia   CXR: personally reviewed my  interpretation is hyperinflation with COPD, no active cardiopulmonary disease  Assessment & Plan by Problem: Active Problems:   COPD exacerbation ([place])   [name] is a 68 year old female with COPD, HTN, type II DM, and anxiety presenting with shortness of breath for the past week. She has been using her breathing treatments but has been out of her rescue inhaler and anxiety medications.   COPD exacerbation:  Patient is saturating well on room air, 97%. She is afebrile with no leukocytosis. She was dyspneic on exam. Chest xray showed hyperinflation consistent with COPD.  She was sick a few weeks ago which likely contributed to this exacerbation, although anxiety is most likely a contributing factor. - Continue flonase, breo ellipta and duoneb breathing treatments - Continue robitussin cough syrup - Start azithromycin 500 mg  - Prednisone 40 mg  - Cardiac monitoring  - O2 goal> 88-92% - Continuous pulse ox - Check pulse ox while ambulating   Tachycardia Anxiety: Patient has a history of anxiety. She normally takes hydroxyzine but has been out of this medication until next month due to affordability. She was tachycardic in the 120's and tremulous and tearful on exam. She felt very anxious about her breathing and not being able to afford medications that will help her. Avoiding her home medication hydroxyzine due to prolonged QTC. - Ativan 1mg  q4h prn for 2 doses - Cardiac monitoring   Hypokalemia: - K 2.8 -Given 40 meq orally in the ED -KCL 6meq IV x6 -Follow up bmp  HTN:  -continue Amlodipine 5mg  daily, Losartan-HCTZ 100-25mg  daily and Metoprolol 25mg  BID  HLD:  -continue simvastatin 40 mg daily   Type II DM:  -home meds include metformin 1000 mg bid  -SSI   Diet: Carb modified  VTE prophylaxis: Lovenox Full Code  Dispo: Admit patient to Observation with expected length of stay less than 2 midnights.  Signed: Rehman, Areeg N, DO  01/01/2019, 11:31 PM  Pager:  [phone]

## 2019-01-01 NOTE — ED Notes (Signed)
Patient in xray 

## 2019-01-01 NOTE — ED Triage Notes (Signed)
Pt reports sob with pain on the left side of her face x 2 days.  Wheezing is noted.  She also has productive cough.

## 2019-01-02 ENCOUNTER — Other Ambulatory Visit: Payer: Self-pay

## 2019-01-02 DIAGNOSIS — Z79899 Other long term (current) drug therapy: Secondary | ICD-10-CM

## 2019-01-02 DIAGNOSIS — F419 Anxiety disorder, unspecified: Secondary | ICD-10-CM | POA: Diagnosis not present

## 2019-01-02 DIAGNOSIS — E119 Type 2 diabetes mellitus without complications: Secondary | ICD-10-CM | POA: Diagnosis not present

## 2019-01-02 DIAGNOSIS — H6002 Abscess of left external ear: Secondary | ICD-10-CM

## 2019-01-02 DIAGNOSIS — Z888 Allergy status to other drugs, medicaments and biological substances status: Secondary | ICD-10-CM

## 2019-01-02 DIAGNOSIS — J441 Chronic obstructive pulmonary disease with (acute) exacerbation: Secondary | ICD-10-CM

## 2019-01-02 DIAGNOSIS — F17210 Nicotine dependence, cigarettes, uncomplicated: Secondary | ICD-10-CM | POA: Diagnosis not present

## 2019-01-02 DIAGNOSIS — Z7951 Long term (current) use of inhaled steroids: Secondary | ICD-10-CM | POA: Diagnosis not present

## 2019-01-02 DIAGNOSIS — Z7984 Long term (current) use of oral hypoglycemic drugs: Secondary | ICD-10-CM

## 2019-01-02 DIAGNOSIS — I10 Essential (primary) hypertension: Secondary | ICD-10-CM

## 2019-01-02 DIAGNOSIS — E785 Hyperlipidemia, unspecified: Secondary | ICD-10-CM

## 2019-01-02 DIAGNOSIS — E876 Hypokalemia: Secondary | ICD-10-CM

## 2019-01-02 LAB — CBC
HCT: 39.5 % (ref 36.0–46.0)
Hemoglobin: 13.5 g/dL (ref 12.0–15.0)
MCH: 30.6 pg (ref 26.0–34.0)
MCHC: 34.2 g/dL (ref 30.0–36.0)
MCV: 89.6 fL (ref 80.0–100.0)
Platelets: 292 10*3/uL (ref 150–400)
RBC: 4.41 MIL/uL (ref 3.87–5.11)
RDW: 12.5 % (ref 11.5–15.5)
WBC: 8.1 10*3/uL (ref 4.0–10.5)
nRBC: 0 % (ref 0.0–0.2)

## 2019-01-02 LAB — BASIC METABOLIC PANEL
Anion gap: 14 (ref 5–15)
BUN: 11 mg/dL (ref 8–23)
CO2: 18 mmol/L — ABNORMAL LOW (ref 22–32)
CREATININE: 1.1 mg/dL — AB (ref 0.44–1.00)
Calcium: 9 mg/dL (ref 8.9–10.3)
Chloride: 101 mmol/L (ref 98–111)
GFR calc Af Amer: 60 mL/min — ABNORMAL LOW (ref >60)
GFR calc non Af Amer: 52 mL/min — ABNORMAL LOW (ref >60)
Glucose, Bld: 345 mg/dL — ABNORMAL HIGH (ref 70–99)
Potassium: 4.9 mmol/L (ref 3.5–5.1)
Sodium: 133 mmol/L — ABNORMAL LOW (ref 135–145)

## 2019-01-02 LAB — GLUCOSE, CAPILLARY
Glucose-Capillary: 245 mg/dL — ABNORMAL HIGH (ref 70–99)
Glucose-Capillary: 214 mg/dL — ABNORMAL HIGH (ref 70–99)
Glucose-Capillary: 305 mg/dL — ABNORMAL HIGH (ref 70–99)

## 2019-01-02 MED ORDER — HYDROXYZINE HCL 10 MG PO TABS: 10.0000 mg | ORAL_TABLET | Freq: Three times a day (TID) | ORAL | 0 refills | Status: DC | PRN

## 2019-01-02 MED ORDER — AZITHROMYCIN 250 MG PO TABS: ORAL_TABLET | ORAL | 0 refills | Status: DC

## 2019-01-02 MED ORDER — HYDROXYZINE HCL 10 MG PO TABS
10.0000 mg | ORAL_TABLET | Freq: Three times a day (TID) | ORAL | Status: DC | PRN
  Administered 2019-01-02: 10 mg via ORAL
  Filled 2019-01-02 (×3): qty 1

## 2019-01-02 MED ORDER — FLUTICASONE FUROATE-VILANTEROL 100-25 MCG/INH IN AEPB
1.0000 | INHALATION_SPRAY | Freq: Every day | RESPIRATORY_TRACT | Status: DC
  Filled 2019-01-02: qty 28

## 2019-01-02 MED ORDER — PREDNISONE 20 MG PO TABS: 40.0000 mg | ORAL_TABLET | Freq: Every day | ORAL | 0 refills | Status: DC

## 2019-01-02 MED ORDER — HYDROCHLOROTHIAZIDE 25 MG PO TABS
25.0000 mg | ORAL_TABLET | Freq: Every day | ORAL | Status: DC
  Administered 2019-01-02: 25 mg via ORAL
  Filled 2019-01-02: qty 1

## 2019-01-02 MED ORDER — LOSARTAN POTASSIUM 50 MG PO TABS
100.0000 mg | ORAL_TABLET | Freq: Every day | ORAL | Status: DC
  Administered 2019-01-02: 100 mg via ORAL
  Filled 2019-01-02: qty 2

## 2019-01-02 MED ORDER — UMECLIDINIUM BROMIDE 62.5 MCG/INH IN AEPB
1.0000 | INHALATION_SPRAY | Freq: Every day | RESPIRATORY_TRACT | Status: DC
  Administered 2019-01-02: 1 via RESPIRATORY_TRACT
  Filled 2019-01-02: qty 7

## 2019-01-02 MED ORDER — POTASSIUM CHLORIDE 10 MEQ/100ML IV SOLN
10.0000 meq | INTRAVENOUS | Status: DC
  Filled 2019-01-02: qty 100

## 2019-01-02 MED FILL — predniSONE 20 MG TABS: 20 | 4 days supply | Qty: 8 | Fill #0

## 2019-01-02 MED FILL — AZITHROMYCIN 250 MG TABLET: 250 | 4 days supply | Qty: 4 | Fill #0

## 2019-01-02 MED FILL — hydrOXYzine HCL 10 MG TABS: 10 | 5 days supply | Qty: 15 | Fill #0

## 2019-01-02 NOTE — Care Management Obs Status (Signed)
[facility] NOTIFICATION   Patient Details  Name: [name] MRN: [id number] Date of Birth: January 03, 1951   Medicare Observation Status Notification Given:  Yes    Makeshia, Seat, RN 01/02/2019, 1:06 PM

## 2019-01-02 NOTE — Discharge Instructions (Signed)
[name],   It has been a pleasure working with you and we are glad you're feeling better. You were hospitalized for a mild COPD exacerbation.   For your COPD exacerbation,  START taking prednisone 40 mg daily for 4 more days Start taking azithromycin 250 mg daily for 4 more day  Continue to take your hydroxyzine  Please follow up in clinic tomorrow at 1:15  If your symptoms worsen or you develop new symptoms, please seek medical help whether it is your primary care provider or emergency department.  If you have any questions about this hospitalization please call [phone].

## 2019-01-02 NOTE — Discharge Summary (Signed)
Name: [name] MRN: [id number] DOB: June 03, 1951 68 y.o. PCP: Welford Roche, MD  Date of Admission: 01/01/2019  7:01 PM Date of Discharge: 01/02/2019 Attending Physician: Aldine Contes  Discharge Diagnosis: 1. Acute COPD exacerbation 2. Anxiety  3. Hypokalemia  Discharge Medications: Allergies as of 01/02/2019      Reactions   Ace Inhibitors Swelling   Throat swelling. Lisinopril-HCTZ   Flonase [fluticasone] Other (See Comments)   Sinuses stop up and condition worsens      Medication List    STOP taking these medications   buPROPion 300 MG 24 hr tablet Commonly known as:  WELLBUTRIN XL   chlorpheniramine-HYDROcodone 10-8 MG/5ML Suer Commonly known as:  TUSSIONEX   cyclobenzaprine 5 MG tablet Commonly known as:  FLEXERIL   diclofenac sodium 1 % Gel Commonly known as:  VOLTAREN   erythromycin ophthalmic ointment   indomethacin 25 MG capsule Commonly known as:  INDOCIN   levocetirizine 5 MG tablet Commonly known as:  XYZAL   RA ASPIRIN EC ADULT LOW ST 81 MG EC tablet Generic drug:  aspirin     TAKE these medications   ACCU-CHEK FASTCLIX LANCETS Misc 1 each by Other route See admin instructions. Check blood sugar daily as needed for high blood sugar.   ACCU-CHEK NANO SMARTVIEW w/Device Kit 1 each by Other route See admin instructions. Check blood sugar up to 4 times daily as needed for high blood sugar. Code: E-11.9   acetaminophen 500 MG tablet Commonly known as:  TYLENOL Take 1,000 mg by mouth every 6 (six) hours as needed for headache (pain).   albuterol 108 (90 Base) MCG/ACT inhaler Commonly known as:  PROVENTIL HFA;VENTOLIN HFA Inhale 2 puffs into the lungs every 6 (six) hours as needed for wheezing or shortness of breath.   amLODipine 5 MG tablet Commonly known as:  NORVASC Take 1 tablet (5 mg total) by mouth daily.   azithromycin 250 MG tablet Commonly known as:  ZITHROMAX Take 1 tablet daily for 4 days Start taking on:   January 03, 2019   cetirizine 10 MG tablet Commonly known as:  ZYRTEC Take 1 tablet (10 mg total) by mouth daily.   clindamycin 1 % external solution Commonly known as:  CLEOCIN-T Apply topically 2 (two) times daily.   glucose blood test strip Commonly known as:  ACCU-CHEK SMARTVIEW Use to check blood sugar 1 to 2 times daily. diag code E 11.9. Non- insulin dependent   hydrOXYzine 10 MG tablet Commonly known as:  ATARAX/VISTARIL Take 1 tablet (10 mg total) by mouth 3 (three) times daily as needed for itching or anxiety.   ipratropium-albuterol 0.5-2.5 (3) MG/3ML Soln Commonly known as:  DUONEB Take 3 mLs by nebulization every 6 (six) hours as needed. What changed:  reasons to take this   losartan-hydrochlorothiazide 100-25 MG tablet Commonly known as:  HYZAAR TAKE 1 TABLET BY MOUTH EVERY DAY   metFORMIN 1000 MG tablet Commonly known as:  GLUCOPHAGE Take 1 tablet (1,000 mg total) by mouth 2 (two) times daily.   metoprolol tartrate 25 MG tablet Commonly known as:  LOPRESSOR Take 1 tablet (25 mg total) by mouth 2 (two) times daily.   predniSONE 20 MG tablet Commonly known as:  DELTASONE Take 2 tablets (40 mg total) by mouth daily with breakfast.   simvastatin 40 MG tablet Commonly known as:  ZOCOR Take 1 tablet (40 mg total) by mouth daily at 6 PM.   TRELEGY ELLIPTA 100-62.5-25 MCG/INH Aepb Generic drug:  Fluticasone-Umeclidin-Vilant Inhale  1 puff into the lungs daily.       Disposition and follow-up:   [name] was discharged from [facility] in Stable condition.  At the hospital follow up visit please address:  1.  1. Acute COPD exacerbation: She should have azithromycin and prednisone to complete a 5 day course.  2. Anxiety: Gave a prescription for her hydroxyzine, she had been on wellbutrin before, please consider restarting this or an SSRI 3. Hypokalemia: Repleted and went back up to 4.9, please recheck labs.   2.  Labs /  imaging needed at time of follow-up: CBC, BMP  3.  Pending labs/ test needing follow-up: None  Follow-up Appointments: Follow-up Information    Welford Roche, MD. Go on 01/03/2019.   Specialty:  Internal Medicine Why:  Please go to appointment at 1:15 PM Contact information: [place] [address] [phone]           Hospital Course by problem list: 1. Acute COPD exacerbation: This is a 68 year old female with history of COPD, hypertension, type I 2 diabetes, anxiety presented with 1 week history of worsening shortness of breath.  She had sick contacts and thinks she got a cold from them.  She reported cold sweats, nasal congestion, rhinorrhea and productive cough with yellow-green sputum.  She was found to be hypertensive, tachypneic and tachycardic. Chest x-ray showed hyperinflation, no acute findings.  Treated with breathing treatments, Solu-Medrol, Ativan and IV potassium. She had improvement in her symptoms and her exam. She was able to speak in full sentences, did not require any oxygen, and had only minimal wheezing on exam. She was very anxious and started having increased work of breathing however it appeared that her increased work of breathing was more related her anxiety other than her COPD. She was discharged with a 5 day course of azithromycin and prednisone 40 mg. She was continued on her home medications on discharge.   2. Anxiety: She reported that she had ran out of her hydroxyzine prior to arrival. She was getting very anxious and had a panic attack in the ED. This may have been contributing to her shortness of breath. She was given hydroxyzine and Ativan.  She was given a prescription for hydroxyzine on discharge.  3. Hypokalemia: On admission potassium was 2.8, this was repleted with oral and IV potassium.  Came up to 4.9.  Discharge Vitals:   BP 134/71 (BP Location: Left Arm)   Pulse 91   Temp 98.1 F (36.7 C) (Oral)   Resp 17   Ht _0   (1.676 m)   Wt 87.7 kg   SpO2 100%   BMI 31.21 kg/m   Pertinent Labs, Studies, and Procedures:  BMP Latest Ref Rng & Units 01/02/2019 01/01/2019 09/04/2018  Glucose 70 - 99 mg/dL 345(H) 118(H) 314(H)  BUN 8 - 23 mg/dL _1 Creatinine 0.44 - 1.00 mg/dL 1.10(H) 0.94 0.98  BUN/Creat Ratio 12 - 28 - - -  Sodium 135 - 145 mmol/L 133(L) 137 133(L)  Potassium 3.5 - 5.1 mmol/L 4.9 2.8(L) 3.6  Chloride 98 - 111 mmol/L 101 100 100  CO2 22 - 32 mmol/L 18(L) 21(L) 18(L)  Calcium 8.9 - 10.3 mg/dL 9.0 9.4 8.9   CBC Latest Ref Rng & Units 01/02/2019 01/01/2019 09/03/2018  WBC 4.0 - 10.5 K/uL 8.1 5.8 5.1  Hemoglobin 12.0 - 15.0 g/dL 13.5 13.8 12.6  Hematocrit 36.0 - 46.0 % 39.5 40.4 38.5  Platelets 150 -  400 K/uL 292 293 236    Discharge Instructions: Discharge Instructions    Call MD for:  difficulty breathing, headache or visual disturbances   Complete by:  As directed    Call MD for:  extreme fatigue   Complete by:  As directed    Call MD for:  hives   Complete by:  As directed    Call MD for:  persistant dizziness or light-headedness   Complete by:  As directed    Call MD for:  persistant nausea and vomiting   Complete by:  As directed    Call MD for:  redness, tenderness, or signs of infection (pain, swelling, redness, odor or green/yellow discharge around incision site)   Complete by:  As directed    Call MD for:  severe uncontrolled pain   Complete by:  As directed    Call MD for:  temperature >100.4   Complete by:  As directed    Diet - low sodium heart healthy   Complete by:  As directed    Discharge instructions   Complete by:  As directed    [name],   It has been a pleasure working with you and we are glad you're feeling better. You were hospitalized for a mild COPD exacerbation.   For your COPD exacerbation,  START taking prednisone 40 mg daily for 4 more days Start taking azithromycin 250 mg daily for 4 more day  Continue to take your  hydroxyzine  Please follow up in clinic tomorrow at 1:15  If your symptoms worsen or you develop new symptoms, please seek medical help whether it is your primary care provider or emergency department.  If you have any questions about this hospitalization please call [phone].   Increase activity slowly   Complete by:  As directed       Signed: Asencion Noble, MD 01/02/2019, 6:37 PM   Pager: [phone]

## 2019-01-02 NOTE — Progress Notes (Signed)
Subjective: Ms. Lacey reports that she is doing a little better today, no acute events overnight.  She states that she still feels very unwell and sick.  She states that her breathing is a little better today but she is not back at her baseline.  She is very concerned about going home today, she states that she thinks that she is sent home too early all the time, and that she feels like she is not ready to go home. We discussed that she appears to be doing better and that she will have close follow up in the clinic. We dicussed that she does not appear to require an inpatient stay, her lungs sound good, she is oxygenating well on room air and we are only going to be giving her oral medications which she can take at home.  Discussed that she Artie has a follow-up in the clinic tomorrow and she will evaluated then.  Objective:  Vital signs in last 24 hours: Vitals:   01/01/19 2038 01/01/19 2100 01/01/19 2230 01/02/19 0009  BP:  (!) 142/74 (!) 153/82 116/67  Pulse:  (!) 107 (!) 127 (!) 127  Resp:  (!) 25 (!) 21 18  Temp:    98.3 F (36.8 C)  TempSrc:    Oral  SpO2: 100% 100% 100% 100%  Weight:    87.7 kg  Height:    5\' 6"  (1.676 m)    General: Well appearing female, intermittent distress, on RA Cardiac: RRR, no m/r/g Pulmonary: Minimal diffuse wheezing, normal work of breathing, able to speak in full sentences Abdomen: Soft, non-tender, non-distended Extremity:No LE edema Psychiatry: Intermittent anxiety, distress when discussing going home    Assessment/Plan:  Active Problems:   COPD exacerbation ([place]) This is a 68 year old female with history of COPD, hypertension, type I 2 diabetes, anxiety presented with 1 week history of worsening shortness of breath.  She had sick contacts and thinks she got them.  She reports cold sweats, nasal congestion, rhinorrhea and productive cough with yellow-green sputum.  She was found to be hypertensive, tachypneic and tachycardic.  Potassium was  2.8.  Chest x-ray showed hyperinflation, no acute findings.  Treated with breathing treatments, Solu-Medrol, Ativan and IV potassium.  COPD exacerbation: -This may be a mild exacerbation caused by a viral illness. She has improved today and is objectively better. Minimal wheezing on exam, no oxygen requirements and able to speak in full sentences. She is very anxious about going home and reports that she is not at her baseline, there may be a component of anxiety that is worsening her breathing, she was able to speak in full sentences however she did have some increased work of breathing when she was getting anxious. We will continue to treat for a mild exacerbation.  -Continue azithromycin -Continue prednisone 40 mg x 5 days -Continue Flonase, Breo Ellipta and DuoNeb breathing treatments -Continue supplemental oxygen as needed, O2 goal 88-92%  Anxiety: She reports that she ran out of her hydroxyzine and has not been able to take it, she does have intermittent episodes of anxiety in distress. -Continue ativan 1mg  q4 hr PRN -Continue hydroxyzine 10 mg TID PRN -Provide prescription for hydroxyzine on discharge  Hypokalemia: -On admisison K was 2.8, repeleted with oral and IV K.  -K today was 4.9 -Daily BMP, replete as needed  HTN: Continue home amlodipine 5 mg daily, losartan-HCTZ 100-25 mg daily, and metoprolol 25 mg twice daily  HLD: Continue home simvastatin 40 mg daily  Type 2 DM:  Continue SSI -Hold home metformin   FEN: No fluids, replete lytes prn, carb mod VTE ppx: Lovenox  Code Status: FULL    Dispo: Anticipated discharge in approximately today.   Asencion Noble, MD 01/02/2019, 6:29 AM Pager: [phone]

## 2019-01-03 ENCOUNTER — Ambulatory Visit: Payer: Medicare Other

## 2019-01-03 ENCOUNTER — Encounter: Payer: Self-pay | Admitting: Internal Medicine

## 2019-01-08 ENCOUNTER — Telehealth: Payer: Self-pay

## 2019-01-08 NOTE — Telephone Encounter (Signed)
Returned call to patient. States she was seen in ED for COPD and wasn't given enough prednisone or antibiotics. Has appt tomorrow at 0915 in [facility] for HFU. Requesting steroid injection at that time for breathing. Also requesting to see Dr. Maudie Mercury tomorrow as she needs inhalers but cannot afford. Scheduled at 1000 with Dr. Maudie Mercury. Patient has home neb/compressor and is aware that she may use q 4-6 hours prn. Advised to return to ED if she feels her breathing worsens before tomorrow's appt. States she will. No noted distress during our conversation. Hubbard Hartshorn, RN, BSN

## 2019-01-08 NOTE — Telephone Encounter (Signed)
This patient was admitted for COPD and had improved. I agree that she needs to be seen by Dr. Maudie Mercury.

## 2019-01-08 NOTE — Telephone Encounter (Signed)
Needs to speak with a nurse about meds. Please call pt back.  

## 2019-01-09 ENCOUNTER — Ambulatory Visit (INDEPENDENT_AMBULATORY_CARE_PROVIDER_SITE_OTHER): Payer: Medicare Other | Admitting: Internal Medicine

## 2019-01-09 ENCOUNTER — Ambulatory Visit: Payer: Medicare Other | Admitting: Pharmacist

## 2019-01-09 VITALS — BP 153/70 | HR 107 | Temp 99.2°F | Wt 197.9 lb

## 2019-01-09 DIAGNOSIS — Z7951 Long term (current) use of inhaled steroids: Secondary | ICD-10-CM | POA: Diagnosis not present

## 2019-01-09 DIAGNOSIS — J0110 Acute frontal sinusitis, unspecified: Secondary | ICD-10-CM | POA: Diagnosis not present

## 2019-01-09 DIAGNOSIS — L02811 Cutaneous abscess of head [any part, except face]: Secondary | ICD-10-CM

## 2019-01-09 DIAGNOSIS — J019 Acute sinusitis, unspecified: Secondary | ICD-10-CM

## 2019-01-09 DIAGNOSIS — R197 Diarrhea, unspecified: Secondary | ICD-10-CM

## 2019-01-09 DIAGNOSIS — F17210 Nicotine dependence, cigarettes, uncomplicated: Secondary | ICD-10-CM | POA: Diagnosis not present

## 2019-01-09 DIAGNOSIS — J441 Chronic obstructive pulmonary disease with (acute) exacerbation: Secondary | ICD-10-CM | POA: Diagnosis not present

## 2019-01-09 DIAGNOSIS — F329 Major depressive disorder, single episode, unspecified: Secondary | ICD-10-CM | POA: Insufficient documentation

## 2019-01-09 DIAGNOSIS — J449 Chronic obstructive pulmonary disease, unspecified: Secondary | ICD-10-CM | POA: Diagnosis not present

## 2019-01-09 DIAGNOSIS — Z79899 Other long term (current) drug therapy: Secondary | ICD-10-CM | POA: Diagnosis not present

## 2019-01-09 DIAGNOSIS — F419 Anxiety disorder, unspecified: Secondary | ICD-10-CM | POA: Diagnosis not present

## 2019-01-09 DIAGNOSIS — E1140 Type 2 diabetes mellitus with diabetic neuropathy, unspecified: Secondary | ICD-10-CM | POA: Diagnosis not present

## 2019-01-09 HISTORY — DX: Acute sinusitis, unspecified: J01.90

## 2019-01-09 LAB — C DIFFICILE QUICK SCREEN W PCR REFLEX
C DIFFICLE (CDIFF) ANTIGEN: POSITIVE — AB
C Diff toxin: NEGATIVE

## 2019-01-09 LAB — CLOSTRIDIUM DIFFICILE BY PCR, REFLEXED: Toxigenic C. Difficile by PCR: NEGATIVE

## 2019-01-09 MED ORDER — ALBUTEROL SULFATE (2.5 MG/3ML) 0.083% IN NEBU
2.5000 mg | INHALATION_SOLUTION | Freq: Once | RESPIRATORY_TRACT | Status: AC
  Administered 2019-01-09: 2.5 mg via RESPIRATORY_TRACT

## 2019-01-09 MED ORDER — DULOXETINE HCL 60 MG PO CPEP: 60.0000 mg | ORAL_CAPSULE | Freq: Every day | ORAL | 2 refills | Status: DC

## 2019-01-09 MED ORDER — IPRATROPIUM BROMIDE 0.02 % IN SOLN
0.5000 mg | Freq: Once | RESPIRATORY_TRACT | Status: AC
  Administered 2019-01-09: 0.5 mg via RESPIRATORY_TRACT

## 2019-01-09 MED ORDER — AMOXICILLIN-POT CLAVULANATE 875-125 MG PO TABS: 1.0000 | ORAL_TABLET | Freq: Two times a day (BID) | ORAL | 0 refills | Status: DC

## 2019-01-09 MED ORDER — PREDNISONE 20 MG PO TABS: 40.0000 mg | ORAL_TABLET | Freq: Every day | ORAL | 0 refills | Status: DC

## 2019-01-09 NOTE — Telephone Encounter (Signed)
Great catch, Lauren, thank you!

## 2019-01-09 NOTE — Patient Instructions (Addendum)
Thank you for allowing [place] to provide your care today.   I have ordered the following labs for you:  Complete blood count, basic metabolic panel, C. Diff test    I will call if any are abnormal.    Today we made the following changes to your medications:   Please START taking:  For your anxiety, Duloxetine (CYMBALTA) 60 MG capsule once per day   For your sinus infection, Amoxicillin-Clavulanate 875-125 mg three times per day for seven days   For your COPD exacerbation, please take prednisone 20 MG tablet - take two tablets in the morning for five days   For your diarrhea, I will call with results of your stool test. If these are negative, please take Immodium (loperamide) over the counter as directed.   You may take tylenol for your pain after your incision and drainage. Please keep the area of incision clean with a dry dressing, but place a clean warm compress to the area several times per day to help drain the area.   Please STOP taking   Hydroxyzine (VISTARIL)   Please follow-up in two weeks or sooner if symptoms do not improve .    Should you have any questions or concerns please call the internal medicine clinic at [phone].

## 2019-01-09 NOTE — Progress Notes (Signed)
Incision and Drainage Procedure Note   Pre-operative Diagnosis: Abscess Right side head 1.5x1.5cm   Indications: pain, swelling   Anesthesia: Lidocaine 1% without epinephrine    Procedure Details    Consent was obtained and timeout performed prior to beginning the procedure. Area of 1.5x1.5cm abscess was prepped with povidone-iodine solution. Using a 27x1.1 gauge needle the area of the abscess was injected with 1.5 mL Lidocaine 2% with epinephrine. An 11 blade was then used to make a linear incision with drainage of purulent material. The area was explored and blunt dissection of loculated areas performed. Packing was not needed and wound dressed with 4x4 gauze and tape.    Complications:  None; patient tolerated the procedure well.

## 2019-01-09 NOTE — Progress Notes (Signed)
I saw and evaluated the patient. I personally confirmed the key portions of Dr. Aurelio Jew history and exam and reviewed pertinent patient test results. The assessment, diagnosis, and plan were formulated together and I agree with the documentation in the resident's note.  I was present at the bedside of the patient for the entire incision and drainage procedure, which the patient tolerated well and without complication.  She is unable to afford her current inhaler regimen.  It is therefore important that we modify her regimen to one that is more affordable, and thus likely adhered to.  She is symptomatically responding to duonebs giving her a SABA and a SAMA.  Will add budesonide nebulizer at the follow-up visit to see if this is more affordable than the inhalers, realizing she will not be on a LABA or LAMA for financial reasons.  I also believe it will be important to get better control of her anxiety as well, hence the reason for starting the duloxetine.

## 2019-01-09 NOTE — Assessment & Plan Note (Addendum)
Admitted 2/24 for COPD exacerbation and discharged 2/25 with prednisone and azithromycin. Chest xray during admission showed enlarged lungs and her symptoms of exacerbation may have been increased by her anxiety and panic attacks. She states she continues to have SOB, cough with productive clear sputum, and sinus pain. She additionally has had ongoing diarrhea since prior to admission but she is unsure how often she is going to the bathroom per day.   - CBC, BMP today  - unable to provide trelegy sample - given breo and ellipta - she is unable to afford these medications and has only been able to get through samples long term. She does have a nebulizer for which she uses albuterol/ipratropium. May consider starting budesonide nebulizer daily on follow-up which will be more affordable - prednisone 40 mg qam five days  - samples of ellipta and breo provided - amoxicillin-clavulanate 875-125 bid for seven days - follow-up two weeks  - given duoneb treatment, which she states helped symptoms

## 2019-01-09 NOTE — Progress Notes (Signed)
Patient was seen today in a co-visit with Dr. Sharon Seller. Patient has an ongoing challenge obtaining inhalers with difficulty engaging with me to ensure long-term access. Provided samples of Incruse, Breo, and Ventolin, and advised patient to follow up with me next week for medication access support. Patient and husband verbalized understanding.

## 2019-01-09 NOTE — Assessment & Plan Note (Signed)
Symptoms of sinus pain and drainage for more than one week which did not resolve with azithromycin provided at discharge.   - amoxicillin-clavulanate 875-125 MG bid for seven days

## 2019-01-09 NOTE — Assessment & Plan Note (Addendum)
Started before she was admitted to the hospital. She has been taking pepto-bismol, but it was not helping. She's been having chills. Stool described as darker after she started taking pepto but does not look black or like there is blood. She states she is having diarrhea when she sneezes or coughs. She denies new numbness or tingling in her feet but has chronic neuropathy with her diabetes. She denies urinary incontinence.   - with recent hospitalization and antibiotic use, will obtain stool sample for C. Diff PCR - states her stool seemed firmer today after obtaining sample  - immodium for diarrhea if C. Diff negative   ADDENDUM: Antigen positive with negative toxin. reflex C. Diff PCR negative. With her symptoms beginning to resolve and the fact her symptoms began before hospitalization and antibiotic use and negative PCR, her positive antigen is likely colonization of C. Diff. Discussed findings with patient and that she can use immodium for diarrhea and baby wipes for soreness to the area. Instructed to call clinic if her symptoms do not resolve in the next three days.

## 2019-01-09 NOTE — Assessment & Plan Note (Signed)
>>  ASSESSMENT AND PLAN FOR CHRONIC OBSTRUCTIVE PULMONARY DISEASE WITH BRONCHOSPASM ([place]) WRITTEN ON 01/09/2019  1:42 PM BY SEAWELL, JAIMIE A, DO  Admitted 2/24 for COPD exacerbation and discharged 2/25 with prednisone and azithromycin. Chest xray during admission showed enlarged lungs and her symptoms of exacerbation may have been increased by her anxiety and panic attacks. She states she continues to have SOB, cough with productive clear sputum, and sinus pain. She additionally has had ongoing diarrhea since prior to admission but she is unsure how often she is going to the bathroom per day.   - CBC, BMP today  - unable to provide trelegy sample - given breo and ellipta - she is unable to afford these medications and has only been able to get through samples long term. She does have a nebulizer for which she uses albuterol/ipratropium. May consider starting budesonide nebulizer daily on follow-up which will be more affordable - prednisone 40 mg qam five days  - samples of ellipta and breo provided - amoxicillin-clavulanate 875-125 bid for seven days - follow-up two weeks  - given duoneb treatment, which she states helped symptoms

## 2019-01-09 NOTE — Progress Notes (Signed)
   CC: Difficulty Breathing  HPI:  [name] is a 68 y.o. with PMH as below presenting after recent hospitalization for COPD exacerbation with SOB, diarrhea, and a painful swelling on the side of her head.   Please see A&P for assessment of the patient's acute and chronic medical conditions.    Past Medical History:  Diagnosis Date  . Anxiety   . Arthritis    "fingers, knees" (08/16/2018)  . Asthma   . Cancer of right breast ([place]) 1991   s/p lumpectomy, chemotherapy and radiation therapy in 1991. Mammogram in 2007 was normal.  . Constipated    h/o  . COPD (chronic obstructive pulmonary disease) ([place])    History of multiple hospital admissions for exercabation   . COPD with exacerbation ([place]) 04/06/2009   Qualifier: Diagnosis of  By: Eyvonne Mechanic MD, Vijay    . Depression   . Diarrhea    h/o  . GERD (gastroesophageal reflux disease)   . Headache    "a few times/month" (08/16/2018  . Heart murmur 10/05/11   "first time I ever heard I had one was today"  . Hyperlipidemia   . Hypertension   . Obesity   . Personal history of chemotherapy   . Personal history of radiation therapy   . Pneumonia    "couple times in the last 10-15 yrs" (08/16/2018)  . Shortness of breath 10/05/11   "at rest; lying down; w/exertion"  . Sigmoid diverticulitis 80/2008  . Tobacco abuse   . Type II diabetes mellitus ([place])    Review of Systems:   Review of Systems  Constitutional: Positive for chills. Negative for fever.  HENT: Positive for congestion, ear pain, sinus pain and sore throat. Negative for ear discharge.   Eyes: Negative for photophobia, pain, discharge and redness.  Respiratory: Positive for cough, sputum production (clear), shortness of breath and wheezing. Negative for hemoptysis and stridor.   Cardiovascular: Negative for chest pain, palpitations and leg swelling.  Gastrointestinal: Positive for diarrhea. Negative for abdominal pain, blood in stool, nausea and vomiting.    Genitourinary: Negative for dysuria.  Musculoskeletal: Negative for falls and myalgias.  Neurological: Negative for dizziness, sensory change and headaches.  Psychiatric/Behavioral: Positive for depression. Negative for suicidal ideas. The patient is nervous/anxious.    Physical Exam:  Constitution: tearful, well-nourished appearing HENT: TM without swelling or erythema, 1.5x1.5 cm abcess, firm, TTP; moist mucous membranes, no erythema  Eyes: no icterus or injection  Cardio: RRR, no m/r/g  Respiratory: decreased breath sounds, wheezing, no rales or rhonchi  Abdominal: NTTP, soft MSK: no pitting edema, moving all extremities  Neuro: anxious, a&o Skin: c/d/i    Vitals:   01/09/19 0857  BP: (!) 153/70  Pulse: (!) 107  Temp: 99.2 F (37.3 C)  TempSrc: Oral  SpO2: 99%  Weight: 197 lb 14.4 oz (89.8 kg)     Assessment & Plan:   See Encounters Tab for problem based charting.  Patient seen with Dr. Eppie Gibson

## 2019-01-10 LAB — BMP8+ANION GAP
Anion Gap: 21 mmol/L — ABNORMAL HIGH (ref 10.0–18.0)
BUN/Creatinine Ratio: 13 (ref 12–28)
BUN: 11 mg/dL (ref 8–27)
CALCIUM: 8.8 mg/dL (ref 8.7–10.3)
CO2: 21 mmol/L (ref 20–29)
Chloride: 98 mmol/L (ref 96–106)
Creatinine, Ser: 0.82 mg/dL (ref 0.57–1.00)
GFR calc Af Amer: 85 mL/min/{1.73_m2} (ref >59)
GFR calc non Af Amer: 74 mL/min/{1.73_m2} (ref >59)
Glucose: 95 mg/dL (ref 65–99)
Potassium: 3.5 mmol/L (ref 3.5–5.2)
Sodium: 140 mmol/L (ref 134–144)

## 2019-01-10 LAB — CBC
HEMATOCRIT: 40.2 % (ref 34.0–46.6)
HEMOGLOBIN: 13.8 g/dL (ref 11.1–15.9)
MCH: 31.1 pg (ref 26.6–33.0)
MCHC: 34.3 g/dL (ref 31.5–35.7)
MCV: 91 fL (ref 79–97)
Platelets: 323 10*3/uL (ref 150–450)
RBC: 4.44 x10E6/uL (ref 3.77–5.28)
RDW: 13 % (ref 11.7–15.4)
WBC: 8.5 10*3/uL (ref 3.4–10.8)

## 2019-01-11 ENCOUNTER — Telehealth: Payer: Self-pay | Admitting: *Deleted

## 2019-01-11 ENCOUNTER — Other Ambulatory Visit: Payer: Self-pay | Admitting: *Deleted

## 2019-01-11 DIAGNOSIS — I10 Essential (primary) hypertension: Secondary | ICD-10-CM

## 2019-01-11 DIAGNOSIS — E119 Type 2 diabetes mellitus without complications: Secondary | ICD-10-CM

## 2019-01-11 MED ORDER — SIMVASTATIN 40 MG PO TABS: 40.0000 mg | ORAL_TABLET | Freq: Every day | ORAL | 1 refills | Status: DC

## 2019-01-11 MED ORDER — LOSARTAN POTASSIUM-HCTZ 100-25 MG PO TABS: 1.0000 | ORAL_TABLET | Freq: Every day | ORAL | 0 refills | Status: DC

## 2019-01-11 NOTE — Telephone Encounter (Signed)
VO to pharmacist at [facility] to change losartan/hctz to 2 separate drugs due to nationwide shortage: Losartan 100mg  1 tablet by mouth daily  Hydrochlorothiazide 25mg  1 tablet by mouth daily Do you agree?

## 2019-01-12 ENCOUNTER — Other Ambulatory Visit: Payer: Self-pay | Admitting: Internal Medicine

## 2019-01-12 NOTE — Telephone Encounter (Signed)
Yes.  Thank you.

## 2019-01-12 NOTE — Telephone Encounter (Signed)
Pls call patient regarding medicine; pt contact [phone]

## 2019-01-13 DIAGNOSIS — E876 Hypokalemia: Secondary | ICD-10-CM | POA: Diagnosis not present

## 2019-01-13 DIAGNOSIS — J101 Influenza due to other identified influenza virus with other respiratory manifestations: Secondary | ICD-10-CM | POA: Diagnosis not present

## 2019-01-13 DIAGNOSIS — J441 Chronic obstructive pulmonary disease with (acute) exacerbation: Secondary | ICD-10-CM | POA: Diagnosis not present

## 2019-01-14 ENCOUNTER — Other Ambulatory Visit: Payer: Self-pay

## 2019-01-14 ENCOUNTER — Inpatient Hospital Stay (HOSPITAL_COMMUNITY)
Admission: EM | Admit: 2019-01-14 | Discharge: 2019-01-20 | DRG: 194 | Disposition: A | Discharge status: Home / Self Care | Payer: Medicare Other | Attending: Oncology | Admitting: Oncology

## 2019-01-14 ENCOUNTER — Emergency Department (HOSPITAL_COMMUNITY): Payer: Medicare Other

## 2019-01-14 ENCOUNTER — Encounter (HOSPITAL_COMMUNITY): Payer: Self-pay

## 2019-01-14 DIAGNOSIS — Z888 Allergy status to other drugs, medicaments and biological substances status: Secondary | ICD-10-CM

## 2019-01-14 DIAGNOSIS — Z853 Personal history of malignant neoplasm of breast: Secondary | ICD-10-CM

## 2019-01-14 DIAGNOSIS — F17210 Nicotine dependence, cigarettes, uncomplicated: Secondary | ICD-10-CM | POA: Diagnosis present

## 2019-01-14 DIAGNOSIS — Z923 Personal history of irradiation: Secondary | ICD-10-CM

## 2019-01-14 DIAGNOSIS — F419 Anxiety disorder, unspecified: Secondary | ICD-10-CM | POA: Diagnosis present

## 2019-01-14 DIAGNOSIS — E119 Type 2 diabetes mellitus without complications: Secondary | ICD-10-CM | POA: Diagnosis present

## 2019-01-14 DIAGNOSIS — Z981 Arthrodesis status: Secondary | ICD-10-CM

## 2019-01-14 DIAGNOSIS — J449 Chronic obstructive pulmonary disease, unspecified: Secondary | ICD-10-CM

## 2019-01-14 DIAGNOSIS — J441 Chronic obstructive pulmonary disease with (acute) exacerbation: Secondary | ICD-10-CM

## 2019-01-14 DIAGNOSIS — Z809 Family history of malignant neoplasm, unspecified: Secondary | ICD-10-CM

## 2019-01-14 DIAGNOSIS — E785 Hyperlipidemia, unspecified: Secondary | ICD-10-CM | POA: Diagnosis not present

## 2019-01-14 DIAGNOSIS — R0602 Shortness of breath: Secondary | ICD-10-CM | POA: Diagnosis not present

## 2019-01-14 DIAGNOSIS — Z79899 Other long term (current) drug therapy: Secondary | ICD-10-CM

## 2019-01-14 DIAGNOSIS — J101 Influenza due to other identified influenza virus with other respiratory manifestations: Secondary | ICD-10-CM | POA: Diagnosis not present

## 2019-01-14 DIAGNOSIS — I10 Essential (primary) hypertension: Secondary | ICD-10-CM | POA: Diagnosis present

## 2019-01-14 DIAGNOSIS — K219 Gastro-esophageal reflux disease without esophagitis: Secondary | ICD-10-CM | POA: Diagnosis present

## 2019-01-14 DIAGNOSIS — Z7984 Long term (current) use of oral hypoglycemic drugs: Secondary | ICD-10-CM

## 2019-01-14 DIAGNOSIS — R011 Cardiac murmur, unspecified: Secondary | ICD-10-CM | POA: Diagnosis not present

## 2019-01-14 DIAGNOSIS — E876 Hypokalemia: Secondary | ICD-10-CM | POA: Diagnosis not present

## 2019-01-14 DIAGNOSIS — Z9221 Personal history of antineoplastic chemotherapy: Secondary | ICD-10-CM

## 2019-01-14 HISTORY — DX: Chronic obstructive pulmonary disease with (acute) exacerbation: J44.1

## 2019-01-14 LAB — INFLUENZA PANEL BY PCR (TYPE A & B)
Influenza A By PCR: POSITIVE — AB
Influenza B By PCR: NEGATIVE

## 2019-01-14 LAB — BASIC METABOLIC PANEL
Anion gap: 14 (ref 5–15)
BUN: 9 mg/dL (ref 8–23)
CO2: 23 mmol/L (ref 22–32)
Calcium: 9.1 mg/dL (ref 8.9–10.3)
Chloride: 95 mmol/L — ABNORMAL LOW (ref 98–111)
Creatinine, Ser: 0.83 mg/dL (ref 0.44–1.00)
GFR calc Af Amer: >60 mL/min (ref >60)
GFR calc non Af Amer: >60 mL/min (ref >60)
Glucose, Bld: 103 mg/dL — ABNORMAL HIGH (ref 70–99)
Potassium: 3.1 mmol/L — ABNORMAL LOW (ref 3.5–5.1)
Sodium: 132 mmol/L — ABNORMAL LOW (ref 135–145)

## 2019-01-14 LAB — CBC
HCT: 40.3 % (ref 36.0–46.0)
Hemoglobin: 13.6 g/dL (ref 12.0–15.0)
MCH: 29.9 pg (ref 26.0–34.0)
MCHC: 33.7 g/dL (ref 30.0–36.0)
MCV: 88.6 fL (ref 80.0–100.0)
Platelets: 348 10*3/uL (ref 150–400)
RBC: 4.55 MIL/uL (ref 3.87–5.11)
RDW: 11.9 % (ref 11.5–15.5)
WBC: 12.5 10*3/uL — ABNORMAL HIGH (ref 4.0–10.5)
nRBC: 0 % (ref 0.0–0.2)

## 2019-01-14 LAB — MAGNESIUM: Magnesium: 1.5 mg/dL — ABNORMAL LOW (ref 1.7–2.4)

## 2019-01-14 MED ORDER — MORPHINE SULFATE (PF) 4 MG/ML IV SOLN
4.0000 mg | Freq: Once | INTRAVENOUS | Status: AC
  Administered 2019-01-14: 4 mg via INTRAVENOUS
  Filled 2019-01-14: qty 1

## 2019-01-14 MED ORDER — METHYLPREDNISOLONE SODIUM SUCC 125 MG IJ SOLR
125.0000 mg | Freq: Once | INTRAMUSCULAR | Status: AC
  Administered 2019-01-14: 125 mg via INTRAVENOUS
  Filled 2019-01-14: qty 2

## 2019-01-14 MED ORDER — IPRATROPIUM-ALBUTEROL 0.5-2.5 (3) MG/3ML IN SOLN
3.0000 mL | Freq: Once | RESPIRATORY_TRACT | Status: AC
  Administered 2019-01-14: 3 mL via RESPIRATORY_TRACT
  Filled 2019-01-14: qty 3

## 2019-01-14 MED ORDER — ACETAMINOPHEN 650 MG RE SUPP: 650.0000 mg | Freq: Four times a day (QID) | RECTAL | Status: DC | PRN

## 2019-01-14 MED ORDER — AMLODIPINE BESYLATE 5 MG PO TABS
5.0000 mg | ORAL_TABLET | Freq: Every day | ORAL | Status: DC
  Administered 2019-01-14 – 2019-01-17 (×4): 5 mg via ORAL
  Filled 2019-01-14 (×4): qty 1

## 2019-01-14 MED ORDER — LEVALBUTEROL HCL 0.63 MG/3ML IN NEBU
0.6300 mg | INHALATION_SOLUTION | Freq: Four times a day (QID) | RESPIRATORY_TRACT | Status: DC
  Administered 2019-01-15 – 2019-01-20 (×22): 0.63 mg via RESPIRATORY_TRACT
  Filled 2019-01-14 (×22): qty 3

## 2019-01-14 MED ORDER — POTASSIUM CHLORIDE 10 MEQ/100ML IV SOLN
10.0000 meq | Freq: Once | INTRAVENOUS | Status: AC
  Administered 2019-01-14: 10 meq via INTRAVENOUS
  Filled 2019-01-14: qty 100

## 2019-01-14 MED ORDER — IPRATROPIUM-ALBUTEROL 0.5-2.5 (3) MG/3ML IN SOLN: 3.0000 mL | RESPIRATORY_TRACT | Status: DC

## 2019-01-14 MED ORDER — IPRATROPIUM-ALBUTEROL 0.5-2.5 (3) MG/3ML IN SOLN: 3.0000 mL | Freq: Three times a day (TID) | RESPIRATORY_TRACT | Status: DC

## 2019-01-14 MED ORDER — ONDANSETRON 4 MG PO TBDP
4.0000 mg | ORAL_TABLET | Freq: Three times a day (TID) | ORAL | Status: DC | PRN
  Administered 2019-01-16: 4 mg via ORAL
  Filled 2019-01-14: qty 1

## 2019-01-14 MED ORDER — PREDNISONE 20 MG PO TABS
40.0000 mg | ORAL_TABLET | Freq: Every day | ORAL | Status: DC
  Administered 2019-01-14 – 2019-01-20 (×7): 40 mg via ORAL
  Filled 2019-01-14 (×7): qty 2

## 2019-01-14 MED ORDER — IPRATROPIUM-ALBUTEROL 0.5-2.5 (3) MG/3ML IN SOLN: 3.0000 mL | Freq: Four times a day (QID) | RESPIRATORY_TRACT | Status: DC | PRN

## 2019-01-14 MED ORDER — HYDROXYZINE HCL 10 MG PO TABS
10.0000 mg | ORAL_TABLET | Freq: Three times a day (TID) | ORAL | Status: DC | PRN
  Administered 2019-01-15 – 2019-01-17 (×5): 10 mg via ORAL
  Filled 2019-01-14 (×8): qty 1

## 2019-01-14 MED ORDER — METOPROLOL TARTRATE 25 MG PO TABS
25.0000 mg | ORAL_TABLET | Freq: Two times a day (BID) | ORAL | Status: DC
  Administered 2019-01-14 – 2019-01-20 (×13): 25 mg via ORAL
  Filled 2019-01-14 (×13): qty 1

## 2019-01-14 MED ORDER — ONDANSETRON HCL 4 MG/2ML IJ SOLN
4.0000 mg | Freq: Once | INTRAMUSCULAR | Status: AC
  Administered 2019-01-14: 4 mg via INTRAVENOUS
  Filled 2019-01-14: qty 2

## 2019-01-14 MED ORDER — SIMVASTATIN 20 MG PO TABS
40.0000 mg | ORAL_TABLET | Freq: Every day | ORAL | Status: DC
  Administered 2019-01-14 – 2019-01-19 (×6): 40 mg via ORAL
  Filled 2019-01-14 (×6): qty 2

## 2019-01-14 MED ORDER — IPRATROPIUM BROMIDE 0.02 % IN SOLN
0.5000 mg | Freq: Four times a day (QID) | RESPIRATORY_TRACT | Status: DC | PRN
  Administered 2019-01-14: 0.5 mg via RESPIRATORY_TRACT
  Filled 2019-01-14: qty 2.5

## 2019-01-14 MED ORDER — SODIUM CHLORIDE 0.9 % IV BOLUS
1000.0000 mL | Freq: Once | INTRAVENOUS | Status: AC
  Administered 2019-01-14: 1000 mL via INTRAVENOUS

## 2019-01-14 MED ORDER — DULOXETINE HCL 60 MG PO CPEP
60.0000 mg | ORAL_CAPSULE | Freq: Every day | ORAL | Status: DC
  Administered 2019-01-14 – 2019-01-20 (×7): 60 mg via ORAL
  Filled 2019-01-14 (×7): qty 1

## 2019-01-14 MED ORDER — UMECLIDINIUM BROMIDE 62.5 MCG/INH IN AEPB
1.0000 | INHALATION_SPRAY | Freq: Every day | RESPIRATORY_TRACT | Status: DC
  Administered 2019-01-16 – 2019-01-20 (×5): 1 via RESPIRATORY_TRACT
  Filled 2019-01-14: qty 7

## 2019-01-14 MED ORDER — FLUTICASONE-UMECLIDIN-VILANT 100-62.5-25 MCG/INH IN AEPB: 1.0000 | INHALATION_SPRAY | Freq: Every day | RESPIRATORY_TRACT | Status: DC

## 2019-01-14 MED ORDER — MAGNESIUM SULFATE 2 GM/50ML IV SOLN
2.0000 g | Freq: Once | INTRAVENOUS | Status: AC
  Administered 2019-01-14: 2 g via INTRAVENOUS
  Filled 2019-01-14: qty 50

## 2019-01-14 MED ORDER — RAMELTEON 8 MG PO TABS
8.0000 mg | ORAL_TABLET | Freq: Every day | ORAL | Status: DC
  Administered 2019-01-15 – 2019-01-19 (×6): 8 mg via ORAL
  Filled 2019-01-14 (×6): qty 1

## 2019-01-14 MED ORDER — IPRATROPIUM-ALBUTEROL 0.5-2.5 (3) MG/3ML IN SOLN
3.0000 mL | RESPIRATORY_TRACT | Status: DC
  Administered 2019-01-14: 3 mL via RESPIRATORY_TRACT

## 2019-01-14 MED ORDER — ENOXAPARIN SODIUM 40 MG/0.4ML ~~LOC~~ SOLN
40.0000 mg | SUBCUTANEOUS | Status: DC
  Administered 2019-01-14 – 2019-01-19 (×6): 40 mg via SUBCUTANEOUS
  Filled 2019-01-14 (×7): qty 0.4

## 2019-01-14 MED ORDER — GUAIFENESIN-CODEINE 100-10 MG/5ML PO SOLN
5.0000 mL | Freq: Four times a day (QID) | ORAL | Status: DC | PRN
  Administered 2019-01-14 (×2): 5 mL via ORAL
  Filled 2019-01-14 (×2): qty 5

## 2019-01-14 MED ORDER — ACETAMINOPHEN 325 MG PO TABS
650.0000 mg | ORAL_TABLET | Freq: Four times a day (QID) | ORAL | Status: DC | PRN
  Administered 2019-01-14 – 2019-01-18 (×4): 650 mg via ORAL
  Filled 2019-01-14 (×4): qty 2

## 2019-01-14 MED ORDER — SODIUM CHLORIDE 0.9 % IV BOLUS
500.0000 mL | Freq: Once | INTRAVENOUS | Status: AC
  Administered 2019-01-14: 500 mL via INTRAVENOUS

## 2019-01-14 MED ORDER — LEVALBUTEROL HCL 0.63 MG/3ML IN NEBU
0.6300 mg | INHALATION_SOLUTION | Freq: Four times a day (QID) | RESPIRATORY_TRACT | Status: DC | PRN
  Administered 2019-01-14 (×2): 0.63 mg via RESPIRATORY_TRACT
  Filled 2019-01-14 (×2): qty 3

## 2019-01-14 MED ORDER — SENNOSIDES-DOCUSATE SODIUM 8.6-50 MG PO TABS
1.0000 | ORAL_TABLET | Freq: Every evening | ORAL | Status: DC | PRN
  Administered 2019-01-17: 1 via ORAL
  Filled 2019-01-14: qty 1

## 2019-01-14 MED ORDER — IPRATROPIUM-ALBUTEROL 0.5-2.5 (3) MG/3ML IN SOLN
RESPIRATORY_TRACT | Status: AC
  Filled 2019-01-14: qty 3

## 2019-01-14 MED ORDER — OSELTAMIVIR PHOSPHATE 75 MG PO CAPS
75.0000 mg | ORAL_CAPSULE | Freq: Once | ORAL | Status: AC
  Administered 2019-01-14: 75 mg via ORAL
  Filled 2019-01-14: qty 1

## 2019-01-14 MED ORDER — POTASSIUM CHLORIDE CRYS ER 20 MEQ PO TBCR
40.0000 meq | EXTENDED_RELEASE_TABLET | Freq: Once | ORAL | Status: AC
  Administered 2019-01-14: 40 meq via ORAL
  Filled 2019-01-14: qty 2

## 2019-01-14 MED ORDER — ALBUTEROL SULFATE (2.5 MG/3ML) 0.083% IN NEBU
5.0000 mg | INHALATION_SOLUTION | Freq: Once | RESPIRATORY_TRACT | Status: AC
  Administered 2019-01-14: 5 mg via RESPIRATORY_TRACT
  Filled 2019-01-14: qty 6

## 2019-01-14 MED ORDER — ALBUTEROL (5 MG/ML) CONTINUOUS INHALATION SOLN
10.0000 mg/h | INHALATION_SOLUTION | Freq: Once | RESPIRATORY_TRACT | Status: AC
  Administered 2019-01-14: 10 mg/h via RESPIRATORY_TRACT
  Filled 2019-01-14: qty 20

## 2019-01-14 MED ORDER — IPRATROPIUM BROMIDE 0.02 % IN SOLN
0.5000 mg | Freq: Four times a day (QID) | RESPIRATORY_TRACT | Status: DC
  Administered 2019-01-15 – 2019-01-20 (×22): 0.5 mg via RESPIRATORY_TRACT
  Filled 2019-01-14 (×22): qty 2.5

## 2019-01-14 MED ORDER — IPRATROPIUM-ALBUTEROL 0.5-2.5 (3) MG/3ML IN SOLN: 3.0000 mL | Freq: Four times a day (QID) | RESPIRATORY_TRACT | Status: DC

## 2019-01-14 MED ORDER — IBUPROFEN 200 MG PO TABS
400.0000 mg | ORAL_TABLET | Freq: Four times a day (QID) | ORAL | Status: DC | PRN
  Administered 2019-01-14 – 2019-01-17 (×5): 400 mg via ORAL
  Filled 2019-01-14 (×5): qty 2

## 2019-01-14 MED ORDER — OSELTAMIVIR PHOSPHATE 75 MG PO CAPS
75.0000 mg | ORAL_CAPSULE | Freq: Two times a day (BID) | ORAL | Status: AC
  Administered 2019-01-14 – 2019-01-19 (×10): 75 mg via ORAL
  Filled 2019-01-14 (×11): qty 1

## 2019-01-14 MED ORDER — FLUTICASONE FUROATE-VILANTEROL 100-25 MCG/INH IN AEPB
1.0000 | INHALATION_SPRAY | Freq: Every day | RESPIRATORY_TRACT | Status: DC
  Administered 2019-01-16 – 2019-01-20 (×3): 1 via RESPIRATORY_TRACT
  Filled 2019-01-14 (×2): qty 28

## 2019-01-14 MED ORDER — KETOROLAC TROMETHAMINE 15 MG/ML IJ SOLN
15.0000 mg | Freq: Once | INTRAMUSCULAR | Status: AC
  Administered 2019-01-14: 15 mg via INTRAVENOUS
  Filled 2019-01-14: qty 1

## 2019-01-14 NOTE — ED Notes (Signed)
1st attempt to call report, RN on 2w still in shift change report and will call back when ready

## 2019-01-14 NOTE — ED Notes (Signed)
ED TO INPATIENT HANDOFF REPORT  ED Nurse Name and Phone #: Vilinda Blanks [phone]  S Name/Age/Gender [name] 68 y.o. female Room/Bed: 018C/018C  Code Status   Code Status: Full Code  Home/SNF/Other Home Patient oriented to: self, place, time and situation Is this baseline? Yes   Triage Complete: Triage complete  Chief Complaint diff breathing asthma and copd hx  Triage Note Pt has SOB that has been going on for the past week, worse today, audible wheezing, speaking in short sentences, tripod position, productive white cough, hx of asthma and COPD.    Allergies Allergies  Allergen Reactions  . Ace Inhibitors Swelling    Throat swelling. Lisinopril-HCTZ  . Flonase [Fluticasone] Other (See Comments)    Sinuses stop up and condition worsens    Level of Care/Admitting Diagnosis ED Disposition    ED Disposition Condition [facility]: [place] [[id number]]  Level of Care: Medical Telemetry [097]  Diagnosis: Influenza A [353299]  Admitting Physician: Axel Filler [[id number]]  Attending Physician: Axel Filler [id number]  PT Class (Do Not Modify): Observation [104]  PT Acc Code (Do Not Modify): Observation [10022]       B Medical/Surgery History Past Medical History:  Diagnosis Date  . Anxiety   . Arthritis    "fingers, knees" (08/16/2018)  . Asthma   . Cancer of right breast ([place]) 1991   s/p lumpectomy, chemotherapy and radiation therapy in 1991. Mammogram in 2007 was normal.  . Constipated    h/o  . COPD (chronic obstructive pulmonary disease) ([place])    History of multiple hospital admissions for exercabation   . COPD with exacerbation ([place]) 04/06/2009   Qualifier: Diagnosis of  By: Eyvonne Mechanic MD, Vijay    . Depression   . Diarrhea    h/o  . GERD (gastroesophageal reflux disease)   . Headache    "a few times/month" (08/16/2018  . Heart murmur 10/05/11   "first time I ever heard I had one was  today"  . Hyperlipidemia   . Hypertension   . Obesity   . Personal history of chemotherapy   . Personal history of radiation therapy   . Pneumonia    "couple times in the last 10-15 yrs" (08/16/2018)  . Shortness of breath 10/05/11   "at rest; lying down; w/exertion"  . Sigmoid diverticulitis 80/2008  . Tobacco abuse   . Type II diabetes mellitus ([place])    Past Surgical History:  Procedure Laterality Date  . ABDOMINAL HYSTERECTOMY    . ANTERIOR CERVICAL DECOMP/DISCECTOMY FUSION  2012   "Dr. Lynann Bologna  put plate in; did something to my vertebrae"  . BACK SURGERY    . BREAST LUMPECTOMY Right 1991  . DOBUTAMINE STRESS ECHO  08/2004   Inferior ischemia, normal LV systolic function, no significant CAD     A IV Location/Drains/Wounds Patient Lines/Drains/Airways Status   Active Line/Drains/Airways    Name:   Placement date:   Placement time:   Site:   Days:   Peripheral IV 01/14/19 Left;Anterior Forearm   01/14/19    0153    Forearm   less than 1          Intake/Output Last 24 hours  Intake/Output Summary (Last 24 hours) at 01/14/2019 7989 Last data filed at 01/14/2019 0449 Gross per 24 hour  Intake 500 ml  Output -  Net 500 ml    Labs/Imaging Results for orders placed or performed during the hospital encounter of  01/14/19 (from the past 48 hour(s))  CBC     Status: Abnormal   Collection Time: 01/14/19  1:15 AM  Result Value Ref Range   WBC 12.5 (H) 4.0 - 10.5 K/uL   RBC 4.55 3.87 - 5.11 MIL/uL   Hemoglobin 13.6 12.0 - 15.0 g/dL   HCT 40.3 36.0 - 46.0 %   MCV 88.6 80.0 - 100.0 fL   MCH 29.9 26.0 - 34.0 pg   MCHC 33.7 30.0 - 36.0 g/dL   RDW 11.9 11.5 - 15.5 %   Platelets 348 150 - 400 K/uL   nRBC 0.0 0.0 - 0.2 %    Comment: Performed at [facility], [place] [address], [place], [place] [address]  Basic metabolic panel     Status: Abnormal   Collection Time: 01/14/19  1:15 AM  Result Value Ref Range   Sodium 132 (L) 135 - 145 mmol/L   Potassium 3.1 (L) 3.5 - 5.1  mmol/L   Chloride 95 (L) 98 - 111 mmol/L   CO2 23 22 - 32 mmol/L   Glucose, Bld 103 (H) 70 - 99 mg/dL   BUN 9 8 - 23 mg/dL   Creatinine, Ser 0.83 0.44 - 1.00 mg/dL   Calcium 9.1 8.9 - 10.3 mg/dL   GFR calc non Af Amer >60 >60 mL/min   GFR calc Af Amer >60 >60 mL/min   Anion gap 14 5 - 15    Comment: Performed at [facility] [address] [place], [place] [address]  Magnesium     Status: Abnormal   Collection Time: 01/14/19  1:43 AM  Result Value Ref Range   Magnesium 1.5 (L) 1.7 - 2.4 mg/dL    Comment: Performed at [facility] [address], [place], [place] [address]  Influenza panel by PCR (type A & B)     Status: Abnormal   Collection Time: 01/14/19  4:51 AM  Result Value Ref Range   Influenza A By PCR POSITIVE (A) NEGATIVE   Influenza B By PCR NEGATIVE NEGATIVE    Comment: (NOTE) The Xpert Xpress Flu assay is intended as an aid in the diagnosis of  influenza and should not be used as a sole basis for treatment.  This  assay is FDA approved for nasopharyngeal swab specimens only. Nasal  washings and aspirates are unacceptable for Xpert Xpress Flu testing. Performed at [facility], [place] [address] [place], [place] [address]    Dg Chest 2 View  Result Date: 01/14/2019 CLINICAL DATA:  Shortness of breath for 1 week EXAM: CHEST - 2 VIEW COMPARISON:  01/01/2019 FINDINGS: Cardiac shadow is stable. Aortic calcifications are again seen. The lungs are hyperinflated similar to that noted on the prior exam. Old rib fractures are seen bilaterally. No focal infiltrate or sizable effusion is seen. Degenerative changes of the thoracic spine are noted. IMPRESSION: COPD without acute abnormality. Electronically Signed   By: Inez Catalina M.D.   On: 01/14/2019 04:54    Pending Labs Unresulted Labs (From admission, onward)   None      Vitals/Pain Today's Vitals   01/14/19 0104 01/14/19 0306 01/14/19 0514 01/14/19 0621  BP:  (!) 135/58  (!) 126/110  Pulse:  (!) 115   (!) 111  Resp:  19  (!) 26  Temp:      TempSrc:      SpO2:  95%  96%  PainSc: 9   8      Isolation  Precautions No active isolations  Medications Medications  enoxaparin (LOVENOX) injection 40 mg (has no administration in time range)  acetaminophen (TYLENOL) tablet 650 mg (has no administration in time range)    Or  acetaminophen (TYLENOL) suppository 650 mg (has no administration in time range)  senna-docusate (Senokot-S) tablet 1 tablet (has no administration in time range)  amLODipine (NORVASC) tablet 5 mg (has no administration in time range)  metoprolol tartrate (LOPRESSOR) tablet 25 mg (has no administration in time range)  simvastatin (ZOCOR) tablet 40 mg (has no administration in time range)  DULoxetine (CYMBALTA) DR capsule 60 mg (has no administration in time range)  hydrOXYzine (ATARAX/VISTARIL) tablet 10 mg (has no administration in time range)  Fluticasone-Umeclidin-Vilant 100-62.5-25 MCG/INH AEPB 1 puff (has no administration in time range)  ipratropium-albuterol (DUONEB) 0.5-2.5 (3) MG/3ML nebulizer solution 3 mL (has no administration in time range)  predniSONE (DELTASONE) tablet 40 mg (has no administration in time range)  oseltamivir (TAMIFLU) capsule 75 mg (has no administration in time range)  albuterol (PROVENTIL) (2.5 MG/3ML) 0.083% nebulizer solution 5 mg (5 mg Nebulization Given 01/14/19 0111)  ipratropium-albuterol (DUONEB) 0.5-2.5 (3) MG/3ML nebulizer solution 3 mL (3 mLs Nebulization Given 01/14/19 0306)  methylPREDNISolone sodium succinate (SOLU-MEDROL) 125 mg/2 mL injection 125 mg (125 mg Intravenous Given 01/14/19 0333)  morphine 4 MG/ML injection 4 mg (4 mg Intravenous Given 01/14/19 0333)  sodium chloride 0.9 % bolus 500 mL (0 mLs Intravenous Stopped 01/14/19 0449)  potassium chloride SA (K-DUR,KLOR-CON) CR tablet 40 mEq (40 mEq Oral Given 01/14/19 0427)  magnesium sulfate IVPB 2 g 50 mL (0 g Intravenous Stopped 01/14/19 0536)  potassium chloride 10 mEq in 100 mL  IVPB (0 mEq Intravenous Stopped 01/14/19 0431)  albuterol (PROVENTIL,VENTOLIN) solution continuous neb (10 mg/hr Nebulization Given 01/14/19 0620)  morphine 4 MG/ML injection 4 mg (4 mg Intravenous Given 01/14/19 0537)  oseltamivir (TAMIFLU) capsule 75 mg (75 mg Oral Given 01/14/19 0621)  ondansetron (ZOFRAN) injection 4 mg (4 mg Intravenous Given 01/14/19 3845)    Mobility walks Low fall risk   Focused Assessments Pulmonary Assessment Handoff:  Lung sounds: Bilateral Breath Sounds: Rhonchi, Expiratory wheezes, Diminished O2 Device: Room Air        R Recommendations: See Admitting Provider Note  Report given to:   Additional Notes: Pt here with incr SOB x 1 wk worsening last night.  Hx COPD and asthma, positive flu a. CXR stable COPD, no acute findings. Plan: treat flu s/s, IV abx (wbc elevated) A/O X 4, ind

## 2019-01-14 NOTE — ED Triage Notes (Signed)
Pt has SOB that has been going on for the past week, worse today, audible wheezing, speaking in short sentences, tripod position, productive white cough, hx of asthma and COPD.

## 2019-01-14 NOTE — Progress Notes (Signed)
   Subjective: The patient was lying in her bed today upon entering the room.  She continues to endorse dyspnea on maintaining normal O2 saturation.  She denotes subjective muscle aches, chills, but most notably her unproductive cough.  She denied chest pain, abdominal pain, headache, myalgias or visual changes.  Objective:  Vital signs in last 24 hours: Vitals:   01/14/19 0730 01/14/19 0745 01/14/19 0830 01/14/19 0904  BP: (!) 156/68 (!) 142/65 (!) 129/59   Pulse: (!) 132 (!) 137 (!) 130 (!) 119  Resp: (!) 27 (!) 27 16 (!) 22  Temp:   99.2 F (37.3 C)   TempSrc:   Oral   SpO2: 100% 96% 100% 96%   General: A/O x4, in no acute distress, afebrile, nondiaphoretic Cardio: RRR, grade 2 systolic ejection murmur Pulmonary: Faint end expiratory wheezing, no crackles or rhonchi auscultated MSK: BLE nontender, nonedematous Psych: Appropriate affect, not depressed in appearance, engages well  Assessment/Plan:  Active Problems:   Type 2 diabetes mellitus (HCC)   Influenza A   COPD with acute exacerbation ([place])  68 year old woman with past medical history notable for COPD with FEV1 1.5 L (60% predicted), was admitted for acute on chronic COPD exacerbation secondary to influenza A infection.  She is hemodynamically stable but remains symptomatic with regard to her shortness of breath and persistent cough.  She denies myalgias but endorses associated fatigue and worsening shortness of breath with activity.  She will be treated with a 5-day course of Tamiflu, prednisone and ipratropium/albuterol nebulizers.  COPD exacerbation secondary to influenza A: Low FEV1.  There is good air movement and oxygenation on exam.  We will continue support with: DuoNebs every 4 hours scheduled Guaifenesin-codeine 11ml Q6 hours PRN Prednisone 40 mg daily for 5 days, this is day 1 Tamiflu 75 mg twice daily Toradol lower rib pain  HTN: We will hold home losartan-chlorothiazide combo given the patient's hypokalemia  and near normal blood pressure.  Will resume when indicated  T2 DM: Last A1c 5.4 not on any current medications other than metformin. Hold metformin and monitor CBGs daily  Diet: Heart healthy, carb modified Code: Full DVT P PX: Enoxaparin Fluids: 1 L bolus Dispo: Anticipated discharge in approximately 1-2 day(s).   Kathi Ludwig, MD 01/14/2019, 11:15 AM Pager# [phone]

## 2019-01-14 NOTE — H&P (Signed)
Date: 01/14/2019               Patient Name:  [name] MRN: [id number]  DOB: February 09, 1951 Age / Sex: 68 y.o., female   PCP: Welford Roche, MD         Medical Service: Internal Medicine Teaching Service         Attending Physician: Dr. Evette Doffing, Mallie Mussel, *    First Contact: Dr. Evern Bio Pager: [phone]  Second Contact: Dr. Kathi Ludwig Pager: [phone]       After Hours (After 5p/  First Contact Pager: [phone]  weekends / holidays): Second Contact Pager: [phone]   Chief Complaint: wheezing, productive cough, and congestion  History of Present Illness: [name] is a 68 year old female with COPD, HTN, and type 2 diabetes presenting with wheezing, productive cough, and congestion for the past 2 weeks.  She states that for the last several weeks she began to feel more short of breath.  She reports cold sweats, nasal congestion, rhinorrhea, productive cough with yellow sputum.  She reports that when her symptoms became very severe she went to the hospital on 2/24 and was admitted for an acute exacerbation of her COPD.  She was treated with breathing treatments, prednisone, and azithromycin.  She does not believe that her symptoms improved with these treatments. She was then seen in clinic on 3/3 where she was noted to have not taken her Memory Dance and Ellipta because of financial issues. She was given samples of these inhalers as well as prescribed prednisone for 5 days and Augmentin for 7 days.  She states that she was unable to get these medication for several days but did start them 2 days ago.  She does not believe that starting these medications has helped alleviate her symptoms.  She reports "right rib pain" when coughing. She denies fevers, abdominal pain, urinary symptoms. She has had several weeks of diarrhea which she attributes to antibiotic use.  She does state that when she stopped taking the antibiotic her diarrhea improved but has returned since taking  Augmentin.    Meds:  .  ACCU-CHEK FASTCLIX LANCETS MISC, 1 each by Other route See admin instructions. Check blood sugar daily as needed for high blood sugar., Disp: , Rfl:  .  acetaminophen (TYLENOL) 500 MG tablet, Take 1,000 mg by mouth every 6 (six) hours as needed for headache (pain)., Disp: , Rfl:  .  albuterol (PROVENTIL HFA;VENTOLIN HFA) 108 (90 Base) MCG/ACT inhaler, Inhale 2 puffs into the lungs every 6 (six) hours as needed for wheezing or shortness of breath., Disp: 1 Inhaler, Rfl: 2 .  amLODipine (NORVASC) 5 MG tablet, Take 1 tablet (5 mg total) by mouth daily., Disp: 30 tablet, Rfl: 1 .  amoxicillin-clavulanate (AUGMENTIN) 875-125 MG tablet, Take 1 tablet by mouth 2 (two) times daily for 7 days., Disp: 14 tablet, Rfl: 0 .  azithromycin (ZITHROMAX) 250 MG tablet, Take 1 tablet daily for 4 days, Disp: 4 each, Rfl: 0 .  Blood Glucose Monitoring Suppl (ACCU-CHEK NANO SMARTVIEW) w/Device KIT, 1 each by Other route See admin instructions. Check blood sugar up to 4 times daily as needed for high blood sugar. Code: E-11.9, Disp: 1 kit, Rfl: 0 .  cetirizine (ZYRTEC) 10 MG tablet, Take 1 tablet (10 mg total) by mouth daily. (Patient not taking: Reported on 09/03/2018), Disp: 30 tablet, Rfl: 0 .  clindamycin (CLEOCIN-T) 1 % external solution, Apply topically 2 (two) times daily. (Patient not taking: Reported on  01/01/2019), Disp: 30 mL, Rfl: 1 .  DULoxetine (CYMBALTA) 60 MG capsule, Take 1 capsule (60 mg total) by mouth daily., Disp: 30 capsule, Rfl: 2 .  Fluticasone-Umeclidin-Vilant (TRELEGY ELLIPTA) 100-62.5-25 MCG/INH AEPB, Inhale 1 puff into the lungs daily. , Disp: , Rfl:  .  glucose blood (ACCU-CHEK SMARTVIEW) test strip, Use to check blood sugar 1 to 2 times daily. diag code E 11.9. Non- insulin dependent, Disp: 100 each, Rfl: 6 .  hydrOXYzine (ATARAX/VISTARIL) 10 MG tablet, Take 1 tablet (10 mg total) by mouth 3 (three) times daily as needed for itching or anxiety., Disp: 15 tablet, Rfl:  0 .  ipratropium-albuterol (DUONEB) 0.5-2.5 (3) MG/3ML SOLN, Take 3 mLs by nebulization every 6 (six) hours as needed. (Patient taking differently: Take 3 mLs by nebulization every 6 (six) hours as needed (shortness of breath/wheezing). ), Disp: 360 mL, Rfl: 3 .  losartan-hydrochlorothiazide (HYZAAR) 100-25 MG tablet, Take 1 tablet by mouth daily., Disp: 90 tablet, Rfl: 0 .  metFORMIN (GLUCOPHAGE) 1000 MG tablet, Take 1 tablet (1,000 mg total) by mouth 2 (two) times daily., Disp: 180 tablet, Rfl: 0 .  metoprolol tartrate (LOPRESSOR) 25 MG tablet, Take 1 tablet (25 mg total) by mouth 2 (two) times daily., Disp: 180 tablet, Rfl: 0 .  predniSONE (DELTASONE) 20 MG tablet, Take 2 tablets (40 mg total) by mouth daily with breakfast., Disp: 8 tablet, Rfl: 0 .  simvastatin (ZOCOR) 40 MG tablet, Take 1 tablet (40 mg total) by mouth daily at 6 PM., Disp: 90 tablet, Rfl: 1  Allergies: Allergies as of 01/14/2019 - Review Complete 01/14/2019  Allergen Reaction Noted  . Ace inhibitors Swelling 12/26/2014  . Flonase [fluticasone] Other (See Comments) 12/25/2014   Past Medical History:  Diagnosis Date  . Anxiety   . Arthritis    "fingers, knees" (08/16/2018)  . Asthma   . Cancer of right breast ([place]) 1991   s/p lumpectomy, chemotherapy and radiation therapy in 1991. Mammogram in 2007 was normal.  . Constipated    h/o  . COPD (chronic obstructive pulmonary disease) ([place])    History of multiple hospital admissions for exercabation   . COPD with exacerbation ([place]) 04/06/2009   Qualifier: Diagnosis of  By: Eyvonne Mechanic MD, Vijay    . Depression   . Diarrhea    h/o  . GERD (gastroesophageal reflux disease)   . Headache    "a few times/month" (08/16/2018  . Heart murmur 10/05/11   "first time I ever heard I had one was today"  . Hyperlipidemia   . Hypertension   . Obesity   . Personal history of chemotherapy   . Personal history of radiation therapy   . Pneumonia    "couple times in the last 10-15 yrs"  (08/16/2018)  . Shortness of breath 10/05/11   "at rest; lying down; w/exertion"  . Sigmoid diverticulitis 80/2008  . Tobacco abuse   . Type II diabetes mellitus (H[facility])     Family History:  Family History  Problem Relation Age of Onset  . Cancer Mother     Social History:  She lives at home with her husband.  She smokes 1 pack of cigarettes every 2 days for 30+ years.  She drinks half a case of beer on the weekends and liquor occasionally.  She denies illicit drug use.  Review of Systems: A complete ROS was negative except as per HPI.   Physical Exam: Blood pressure (!) 126/110, pulse (!) 111, temperature 98.7 F (37.1 C), temperature source Oral,  resp. rate (!) 26, SpO2 96 %. Physical Exam Vitals signs reviewed.  Constitutional:      Appearance: She is well-developed.  HENT:     Head: Normocephalic and atraumatic.  Cardiovascular:     Rate and Rhythm: Normal rate and regular rhythm.  Pulmonary:     Comments: Normal oxygen saturations on room air. Mildly tachypnea . Wheezing present.  Abdominal:     Comments: Mild right upper quadrant tenderness with palpation. Abdomen soft and non-tender.  Musculoskeletal:     Right lower leg: She exhibits no tenderness. No edema.     Left lower leg: She exhibits no tenderness. No edema.  Skin:    General: Skin is warm and dry.  Neurological:     Mental Status: She is alert.  Psychiatric:        Mood and Affect: Mood normal.        Behavior: Behavior normal.    Labs: BMP- hyponatremia at 132, hypokalemia at 3.1 CBC- leukocytosis with WBC of 12.5 Magnesium low at 1.5 Influenza positive  EKG: personally reviewed my interpretation is sinus tachycardia   CXR: personally reviewed my interpretation is hyperinflation of the lungs consistent with COPD.  No interstitial infiltrates or effusions appreciated.  Assessment & Plan by Problem: Active Problems:   Influenza A  Ms. Eakins is a 68 year old female with COPD, HTN, and type 2  diabetes who presents with an acute COPD exacerbation secondary to influenza infection.    COPD exacerbation secondary to influenza infection: - Prednisone 40 mg QD - Tamiflu 75 mg QD - Duoneb qh6 PRN wheezing - Fluticasone-Umeclidin-Vilant inhaler 1 puff daily  Hypomagnesemia: s/p Magnesium 2 g - Continue to monitor  Hypokalemia: s/p 50 mEq K - Continue to monitor  Hypertension: Blood pressure within acceptable limits on home meds as below.  - Continue home amlodipine 5 mg QD - Continue home metoprolol tartrate 25 mg QD  FEN/GI: Heart healthy/carb modified DVT PPX: Enoxaparin subcu CODE STATUS: Full  Dispo: Admit patient to Observation with expected length of stay less than 2 midnights.  Signed: Carroll Sage, MD 01/14/2019, 6:33 AM

## 2019-01-14 NOTE — ED Notes (Signed)
Report called to 2w32 RN Chrys Racer

## 2019-01-14 NOTE — ED Provider Notes (Signed)
[name] EMERGENCY DEPARTMENT Provider Note   CSN: [id number] Arrival date & time: 01/14/19  0054    History   Chief Complaint Chief Complaint  Patient presents with  . Shortness of Breath  . Asthma    HPI [name] is a 68 y.o. female with history of COPD, breast cancer, GERD, diabetes mellitus, diverticulitis, hypertension, hyperlipidemia presenting for evaluation of acute onset, progressively worsening shortness of breath for approximately 2 weeks.  Recent admission for COPD exacerbation on 01/01/2019.  Reports that her symptoms "never really improved "while in the hospital or upon discharge.  She does note some nasal congestion, anosmia for the last 3 days.  Notes cough productive of thick clear and yellow sputum for the last 2 weeks.  Notes chest tightness but no chest pain otherwise.  No abdominal pain.  She does note "rib pain "with cough.  Has been using her home breathing treatments at least 3 times daily with minimal temporary improvement.  Reports that she was on a 5-day prednisone burst after post hospital discharge follow-up at the internal medicine teaching service clinic on 01/09/2019.  She was also started on Augmentin for 7 days but reports she did not start this medication until 2 days ago.  She continues to have diarrhea since prior to her admission but is unsure how many times she is going to the bathroom daily.     The history is provided by the patient.    Past Medical History:  Diagnosis Date  . Anxiety   . Arthritis    "fingers, knees" (08/16/2018)  . Asthma   . Cancer of right breast ([place]) 1991   s/p lumpectomy, chemotherapy and radiation therapy in 1991. Mammogram in 2007 was normal.  . Constipated    h/o  . COPD (chronic obstructive pulmonary disease) ([place])    History of multiple hospital admissions for exercabation   . COPD with exacerbation ([place]) 04/06/2009   Qualifier: Diagnosis of  By: [name]    . Depression   .  Diarrhea    h/o  . GERD (gastroesophageal reflux disease)   . Headache    "a few times/month" (08/16/2018  . Heart murmur 10/05/11   "first time I ever heard I had one was today"  . Hyperlipidemia   . Hypertension   . Obesity   . Personal history of chemotherapy   . Personal history of radiation therapy   . Pneumonia    "couple times in the last 10-15 yrs" (08/16/2018)  . Shortness of breath 10/05/11   "at rest; lying down; w/exertion"  . Sigmoid diverticulitis 80/2008  . Tobacco abuse   . Type II diabetes mellitus [facility])     Patient Active Problem List   Diagnosis Date Noted  . Influenza A 01/14/2019  . COPD with acute exacerbation ([place]) 01/14/2019  . Acute frontal sinusitis 01/09/2019  . Anxiety and depression 01/09/2019  . Abscess of head 01/09/2019  . COPD exacerbation ([place]) 01/01/2019  . [place] lesion, left 12/26/2018  . Type 2 diabetes mellitus with complication, without long-term current use of insulin ([place]) 12/26/2018  . Axillary hidradenitis suppurativa 10/13/2018  . Dizziness 09/21/2018  . Lower extremity edema 09/21/2018  . Irregular cardiac rhythm 08/25/2018  . Chronic recurrent sinusitis   . Alopecia of scalp 07/25/2018  . Bruising 06/21/2018  . Weight loss 06/21/2018  . Itching 06/21/2018  . Atopic dermatitis 03/20/2018  . Seasonal allergies 02/25/2017  . Diarrhea   . Chronic obstructive airway disease  with asthma ([place]) 12/24/2014  . QT prolongation 08/08/2014  . Dyslipidemia associated with type 2 diabetes mellitus ([place]) 06/14/2014  . History of breast cancer 12/01/2012  . Primary osteoarthritis of right knee 09/29/2012  . GAD (generalized anxiety disorder) 12/30/2009  . Type 2 diabetes mellitus ([place]) 05/14/2009  . Tobacco abuse 04/06/2009  . Hypertension, benign essential, goal below 140/90 04/06/2009    Past Surgical History:  Procedure Laterality Date  . ABDOMINAL HYSTERECTOMY    . ANTERIOR CERVICAL DECOMP/DISCECTOMY FUSION  2012   "Dr.  Lynann Bologna  put plate in; did something to my vertebrae"  . BACK SURGERY    . BREAST LUMPECTOMY Right 1991  . DOBUTAMINE STRESS ECHO  08/2004   Inferior ischemia, normal LV systolic function, no significant CAD     OB History   No obstetric history on file.      Home Medications    Prior to Admission medications   Medication Sig Start Date End Date Taking? Authorizing Provider  ACCU-CHEK FASTCLIX LANCETS MISC 1 each by Other route See admin instructions. Check blood sugar daily as needed for high blood sugar.    [provider]  acetaminophen (TYLENOL) 500 MG tablet Take 1,000 mg by mouth every 6 (six) hours as needed for headache (pain).    [provider]  albuterol (PROVENTIL HFA;VENTOLIN HFA) 108 (90 Base) MCG/ACT inhaler Inhale 2 puffs into the lungs every 6 (six) hours as needed for wheezing or shortness of breath. 08/10/18   Forde Dandy, PharmD  amLODipine (NORVASC) 5 MG tablet Take 1 tablet (5 mg total) by mouth daily. 12/07/18   Kathi Ludwig, MD  amoxicillin-clavulanate (AUGMENTIN) 875-125 MG tablet Take 1 tablet by mouth 2 (two) times daily for 7 days. 01/09/19 01/16/19  Seawell, Jaimie A, DO  azithromycin (ZITHROMAX) 250 MG tablet Take 1 tablet daily for 4 days 01/03/19   Asencion Noble, MD  Blood Glucose Monitoring Suppl (ACCU-CHEK NANO SMARTVIEW) w/Device KIT 1 each by Other route See admin instructions. Check blood sugar up to 4 times daily as needed for high blood sugar. Code: E-11.9 09/20/18   Asencion Noble, MD  cetirizine (ZYRTEC) 10 MG tablet Take 1 tablet (10 mg total) by mouth daily. Patient not taking: Reported on 09/03/2018 08/08/18   Maczis, Barth Kirks, PA-C  clindamycin (CLEOCIN-T) 1 % external solution Apply topically 2 (two) times daily. Patient not taking: Reported on 01/01/2019 10/13/18   Ina Homes, MD  DULoxetine (CYMBALTA) 60 MG capsule Take 1 capsule (60 mg total) by mouth daily. 01/09/19   Seawell, Jaimie A, DO    Fluticasone-Umeclidin-Vilant (TRELEGY ELLIPTA) 100-62.5-25 MCG/INH AEPB Inhale 1 puff into the lungs daily.     [provider]  glucose blood (ACCU-CHEK SMARTVIEW) test strip Use to check blood sugar 1 to 2 times daily. diag code E 11.9. Non- insulin dependent 10/02/14   Sid Falcon, MD  hydrOXYzine (ATARAX/VISTARIL) 10 MG tablet Take 1 tablet (10 mg total) by mouth 3 (three) times daily as needed for itching or anxiety. 01/02/19   Asencion Noble, MD  ipratropium-albuterol (DUONEB) 0.5-2.5 (3) MG/3ML SOLN Take 3 mLs by nebulization every 6 (six) hours as needed. Patient taking differently: Take 3 mLs by nebulization every 6 (six) hours as needed (shortness of breath/wheezing).  08/24/18   Seawell, Jaimie A, DO  losartan-hydrochlorothiazide (HYZAAR) 100-25 MG tablet Take 1 tablet by mouth daily. 01/11/19   Welford Roche, MD  metFORMIN (GLUCOPHAGE) 1000 MG tablet Take 1 tablet (1,000 mg total)  by mouth 2 (two) times daily. 11/24/18   Bartholomew Crews, MD  metoprolol tartrate (LOPRESSOR) 25 MG tablet Take 1 tablet (25 mg total) by mouth 2 (two) times daily. 10/13/18   Ina Homes, MD  predniSONE (DELTASONE) 20 MG tablet Take 2 tablets (40 mg total) by mouth daily with breakfast. 01/09/19   Seawell, Jaimie A, DO  simvastatin (ZOCOR) 40 MG tablet Take 1 tablet (40 mg total) by mouth daily at 6 PM. 01/11/19   Welford Roche, MD    Family History Family History  Problem Relation Age of Onset  . Cancer Mother     Social History Social History   Tobacco Use  . Smoking status: Current Every Day Smoker    Packs/day: 0.50    Years: 45.00    Pack years: 22.50    Types: Cigarettes  . Smokeless tobacco: Never Used  Substance Use Topics  . Alcohol use: Yes    Alcohol/week: 4.0 standard drinks    Types: 4 Cans of beer per week    Comment: 08/16/2018 "weekends only"  . Drug use: No     Allergies   Ace inhibitors and Flonase [fluticasone]   Review of  Systems Review of Systems  Constitutional: Positive for fatigue. Negative for chills and fever.  HENT: Positive for congestion.   Respiratory: Positive for cough, chest tightness and shortness of breath.   Cardiovascular: Negative for chest pain and leg swelling.  Gastrointestinal: Positive for diarrhea. Negative for abdominal pain, blood in stool, nausea and vomiting.  All other systems reviewed and are negative.    Physical Exam Updated Vital Signs BP (!) 126/110   Pulse (!) 111   Temp 98.7 F (37.1 C) (Oral)   Resp (!) 26   SpO2 96%   Physical Exam Vitals signs and nursing note reviewed.  Constitutional:      General: She is not in acute distress.    Appearance: She is well-developed.  HENT:     Head: Normocephalic and atraumatic.  Eyes:     General:        Right eye: No discharge.        Left eye: No discharge.     Conjunctiva/sclera: Conjunctivae normal.  Neck:     Vascular: No JVD.     Trachea: No tracheal deviation.  Cardiovascular:     Rate and Rhythm: Tachycardia present.  Pulmonary:     Effort: Tachypnea present.     Breath sounds: Wheezing present.     Comments: Diffuse expiratory wheezing.  Speaking in short sentences.  SPO2 saturations 95% on room air Chest:     Chest wall: Tenderness present.    Abdominal:     General: There is no distension.     Palpations: Abdomen is soft. There is no mass.     Tenderness: There is no abdominal tenderness. There is no guarding.  Musculoskeletal:     Right lower leg: She exhibits no tenderness. No edema.     Left lower leg: She exhibits no tenderness. No edema.  Skin:    General: Skin is warm and dry.     Findings: No erythema.  Neurological:     Mental Status: She is alert.  Psychiatric:        Behavior: Behavior normal.      ED Treatments / Results  Labs (all labs ordered are listed, but only abnormal results are displayed) Labs Reviewed  CBC - Abnormal; Notable for the following components:  Result Value   WBC 12.5 (*)    All other components within normal limits  BASIC METABOLIC PANEL - Abnormal; Notable for the following components:   Sodium 132 (*)    Potassium 3.1 (*)    Chloride 95 (*)    Glucose, Bld 103 (*)    All other components within normal limits  MAGNESIUM - Abnormal; Notable for the following components:   Magnesium 1.5 (*)    All other components within normal limits  INFLUENZA PANEL BY PCR (TYPE A & B) - Abnormal; Notable for the following components:   Influenza A By PCR POSITIVE (*)    All other components within normal limits    EKG EKG Interpretation  Date/Time:  Sunday January 14 2019 01:02:57 EST Ventricular Rate:  120 PR Interval:  126 QRS Duration: 88 QT Interval:  328 QTC Calculation: 463 R Axis:   79 Text Interpretation:  Sinus tachycardia with Possible Premature atrial complexes with Abberant conduction Biatrial enlargement Nonspecific ST abnormality Abnormal ECG When compared with ECG of 01/01/2019, No significant change was found Confirmed by Delora Fuel ([id number]) on 01/14/2019 1:10:40 AM   Radiology Dg Chest 2 View  Result Date: 01/14/2019 CLINICAL DATA:  Shortness of breath for 1 week EXAM: CHEST - 2 VIEW COMPARISON:  01/01/2019 FINDINGS: Cardiac shadow is stable. Aortic calcifications are again seen. The lungs are hyperinflated similar to that noted on the prior exam. Old rib fractures are seen bilaterally. No focal infiltrate or sizable effusion is seen. Degenerative changes of the thoracic spine are noted. IMPRESSION: COPD without acute abnormality. Electronically Signed   By: Inez Catalina M.D.   On: 01/14/2019 04:54    Procedures .Critical Care Performed by: Renita Papa, PA-C Authorized by: Renita Papa, PA-C   Critical care provider statement:    Critical care time (minutes):  45   Critical care was necessary to treat or prevent imminent or life-threatening deterioration of the following conditions:  Respiratory failure    Critical care was time spent personally by me on the following activities:  Discussions with consultants, evaluation of patient's response to treatment, examination of patient, ordering and performing treatments and interventions, ordering and review of laboratory studies, ordering and review of radiographic studies, pulse oximetry, re-evaluation of patient's condition, obtaining history from patient or surrogate and review of old charts   I assumed direction of critical care for this patient from another provider in my specialty: no     (including critical care time)  Medications Ordered in ED Medications  enoxaparin (LOVENOX) injection 40 mg (has no administration in time range)  acetaminophen (TYLENOL) tablet 650 mg (has no administration in time range)    Or  acetaminophen (TYLENOL) suppository 650 mg (has no administration in time range)  senna-docusate (Senokot-S) tablet 1 tablet (has no administration in time range)  amLODipine (NORVASC) tablet 5 mg (has no administration in time range)  metoprolol tartrate (LOPRESSOR) tablet 25 mg (has no administration in time range)  simvastatin (ZOCOR) tablet 40 mg (has no administration in time range)  DULoxetine (CYMBALTA) DR capsule 60 mg (has no administration in time range)  hydrOXYzine (ATARAX/VISTARIL) tablet 10 mg (has no administration in time range)  Fluticasone-Umeclidin-Vilant 100-62.5-25 MCG/INH AEPB 1 puff (has no administration in time range)  ipratropium-albuterol (DUONEB) 0.5-2.5 (3) MG/3ML nebulizer solution 3 mL (has no administration in time range)  predniSONE (DELTASONE) tablet 40 mg (has no administration in time range)  oseltamivir (TAMIFLU) capsule 75 mg (has no administration in time  range)  albuterol (PROVENTIL) (2.5 MG/3ML) 0.083% nebulizer solution 5 mg (5 mg Nebulization Given 01/14/19 0111)  ipratropium-albuterol (DUONEB) 0.5-2.5 (3) MG/3ML nebulizer solution 3 mL (3 mLs Nebulization Given 01/14/19 0306)  methylPREDNISolone  sodium succinate (SOLU-MEDROL) 125 mg/2 mL injection 125 mg (125 mg Intravenous Given 01/14/19 0333)  morphine 4 MG/ML injection 4 mg (4 mg Intravenous Given 01/14/19 0333)  sodium chloride 0.9 % bolus 500 mL (0 mLs Intravenous Stopped 01/14/19 0449)  potassium chloride SA (K-DUR,KLOR-CON) CR tablet 40 mEq (40 mEq Oral Given 01/14/19 0427)  magnesium sulfate IVPB 2 g 50 mL (0 g Intravenous Stopped 01/14/19 0536)  potassium chloride 10 mEq in 100 mL IVPB (0 mEq Intravenous Stopped 01/14/19 0431)  albuterol (PROVENTIL,VENTOLIN) solution continuous neb (10 mg/hr Nebulization Given 01/14/19 0620)  morphine 4 MG/ML injection 4 mg (4 mg Intravenous Given 01/14/19 0537)  oseltamivir (TAMIFLU) capsule 75 mg (75 mg Oral Given 01/14/19 0621)  ondansetron (ZOFRAN) injection 4 mg (4 mg Intravenous Given 01/14/19 9179)     Initial Impression / Assessment and Plan / ED Course  I have reviewed the triage vital signs and the nursing notes.  Pertinent labs & imaging results that were available during my care of the patient were reviewed by me and considered in my medical decision making (see chart for details).        Patient presenting with progressively worsening shortness of breath, productive cough.  She is afebrile, persistently tachycardic in the ED.  Exhibits diffuse expiratory wheezing and has difficulty speaking in full sentences.  Chest x-ray shows no evidence of pneumonia or pleural effusion but does show changes consistent with COPD.  EKG shows sinus tachycardia with no significant changes.  Lab work reviewed by me shows leukocytosis which could be explained by recent steroid use.  Also shows hypokalemia and hypomagnesemia.  She is positive for influenza A.  She received multiple breathing treatments, a continuous albuterol nebulizer, Solu-Medrol, magnesium and potassium in the ED with minimal improvement.  Doubt PE.  Internal medicine teaching service to admit.  Patient seen and evaluated by Dr. Roxanne Mins who agrees with  assessment and plan at this time.  Final Clinical Impressions(s) / ED Diagnoses   Final diagnoses:  COPD exacerbation ([place])  Influenza A  Hypokalemia  Hypomagnesemia    ED Discharge Orders    None       Debroah Baller [birth year]69 [id number]    Delora Fuel, MD 01/65/53 [phone]

## 2019-01-15 DIAGNOSIS — Z7984 Long term (current) use of oral hypoglycemic drugs: Secondary | ICD-10-CM | POA: Diagnosis not present

## 2019-01-15 DIAGNOSIS — Z7951 Long term (current) use of inhaled steroids: Secondary | ICD-10-CM | POA: Diagnosis not present

## 2019-01-15 DIAGNOSIS — E785 Hyperlipidemia, unspecified: Secondary | ICD-10-CM | POA: Diagnosis present

## 2019-01-15 DIAGNOSIS — Z809 Family history of malignant neoplasm, unspecified: Secondary | ICD-10-CM | POA: Diagnosis not present

## 2019-01-15 DIAGNOSIS — Z981 Arthrodesis status: Secondary | ICD-10-CM | POA: Diagnosis not present

## 2019-01-15 DIAGNOSIS — R000 Tachycardia, unspecified: Secondary | ICD-10-CM | POA: Diagnosis not present

## 2019-01-15 DIAGNOSIS — F419 Anxiety disorder, unspecified: Secondary | ICD-10-CM | POA: Diagnosis not present

## 2019-01-15 DIAGNOSIS — J101 Influenza due to other identified influenza virus with other respiratory manifestations: Secondary | ICD-10-CM | POA: Diagnosis not present

## 2019-01-15 DIAGNOSIS — E876 Hypokalemia: Secondary | ICD-10-CM | POA: Diagnosis present

## 2019-01-15 DIAGNOSIS — Z923 Personal history of irradiation: Secondary | ICD-10-CM | POA: Diagnosis not present

## 2019-01-15 DIAGNOSIS — Z853 Personal history of malignant neoplasm of breast: Secondary | ICD-10-CM | POA: Diagnosis not present

## 2019-01-15 DIAGNOSIS — I10 Essential (primary) hypertension: Secondary | ICD-10-CM | POA: Diagnosis not present

## 2019-01-15 DIAGNOSIS — Z888 Allergy status to other drugs, medicaments and biological substances status: Secondary | ICD-10-CM | POA: Diagnosis not present

## 2019-01-15 DIAGNOSIS — Z79899 Other long term (current) drug therapy: Secondary | ICD-10-CM | POA: Diagnosis not present

## 2019-01-15 DIAGNOSIS — R197 Diarrhea, unspecified: Secondary | ICD-10-CM | POA: Diagnosis not present

## 2019-01-15 DIAGNOSIS — E119 Type 2 diabetes mellitus without complications: Secondary | ICD-10-CM | POA: Diagnosis not present

## 2019-01-15 DIAGNOSIS — K219 Gastro-esophageal reflux disease without esophagitis: Secondary | ICD-10-CM | POA: Diagnosis present

## 2019-01-15 DIAGNOSIS — F17210 Nicotine dependence, cigarettes, uncomplicated: Secondary | ICD-10-CM | POA: Diagnosis present

## 2019-01-15 DIAGNOSIS — J441 Chronic obstructive pulmonary disease with (acute) exacerbation: Secondary | ICD-10-CM | POA: Diagnosis not present

## 2019-01-15 DIAGNOSIS — Z7952 Long term (current) use of systemic steroids: Secondary | ICD-10-CM | POA: Diagnosis not present

## 2019-01-15 DIAGNOSIS — Z9221 Personal history of antineoplastic chemotherapy: Secondary | ICD-10-CM | POA: Diagnosis not present

## 2019-01-15 LAB — BLOOD GAS, ARTERIAL
Acid-Base Excess: 2.1 mmol/L — ABNORMAL HIGH (ref 0.0–2.0)
Bicarbonate: 25.8 mmol/L (ref 20.0–28.0)
Drawn by: 519031
FIO2: 21
O2 SAT: 96 %
PCO2 ART: 37.4 mmHg (ref 32.0–48.0)
PO2 ART: 77 mmHg — AB (ref 83.0–108.0)
Patient temperature: 98.6
pH, Arterial: 7.453 — ABNORMAL HIGH (ref 7.350–7.450)

## 2019-01-15 LAB — BASIC METABOLIC PANEL
Anion gap: 7 (ref 5–15)
BUN: 8 mg/dL (ref 8–23)
CALCIUM: 8.2 mg/dL — AB (ref 8.9–10.3)
CO2: 26 mmol/L (ref 22–32)
Chloride: 101 mmol/L (ref 98–111)
Creatinine, Ser: 0.72 mg/dL (ref 0.44–1.00)
GFR calc Af Amer: >60 mL/min (ref >60)
GFR calc non Af Amer: >60 mL/min (ref >60)
Glucose, Bld: 132 mg/dL — ABNORMAL HIGH (ref 70–99)
Potassium: 3.6 mmol/L (ref 3.5–5.1)
Sodium: 134 mmol/L — ABNORMAL LOW (ref 135–145)

## 2019-01-15 LAB — TSH: TSH: 1.431 u[IU]/mL (ref 0.350–4.500)

## 2019-01-15 LAB — MAGNESIUM: Magnesium: 2 mg/dL (ref 1.7–2.4)

## 2019-01-15 MED ORDER — MORPHINE SULFATE (PF) 2 MG/ML IV SOLN
2.0000 mg | INTRAVENOUS | Status: DC
  Administered 2019-01-15: 2 mg via INTRAVENOUS
  Filled 2019-01-15: qty 1

## 2019-01-15 MED ORDER — GUAIFENESIN-CODEINE 100-10 MG/5ML PO SOLN
5.0000 mL | Freq: Four times a day (QID) | ORAL | Status: DC
  Administered 2019-01-15 – 2019-01-19 (×17): 5 mL via ORAL
  Filled 2019-01-15 (×16): qty 5

## 2019-01-15 MED ORDER — LEVALBUTEROL HCL 1.25 MG/0.5ML IN NEBU
INHALATION_SOLUTION | RESPIRATORY_TRACT | Status: AC
  Administered 2019-01-15: 05:00:00
  Filled 2019-01-15: qty 0.5

## 2019-01-15 MED ORDER — LORAZEPAM 1 MG PO TABS
1.0000 mg | ORAL_TABLET | Freq: Two times a day (BID) | ORAL | Status: DC | PRN
  Administered 2019-01-15 – 2019-01-17 (×3): 1 mg via ORAL
  Filled 2019-01-15 (×3): qty 1

## 2019-01-15 MED ORDER — MORPHINE SULFATE (PF) 2 MG/ML IV SOLN
2.0000 mg | INTRAVENOUS | Status: DC | PRN
  Administered 2019-01-15 – 2019-01-17 (×8): 2 mg via INTRAVENOUS
  Filled 2019-01-15 (×9): qty 1

## 2019-01-15 NOTE — Telephone Encounter (Signed)
No answer, no vmail 

## 2019-01-15 NOTE — Progress Notes (Addendum)
   Subjective: Team was paged to bedside due to concern for dyspnea. Patient appeared very anxious and tremulous. She states that with every exhale, she is afraid she won't be able to take the next inhale. She has severe right-sided rib pain when she coughs. The cough takes her breath away. The breathing treatments help some, but they don't take away the cough or the pain. Explained to the patient that we will add on medications for cough suppression and pain control.   Objective:  Vital signs in last 24 hours: Vitals:   01/14/19 2205 01/14/19 2217 01/15/19 0149 01/15/19 0514  BP:  130/70    Pulse:  (!) 108    Resp:      Temp:  98 F (36.7 C)    TempSrc:  Oral    SpO2: 96% 96% 97% 99%   Gen: Patient appeared distressed, anxious, and tremulous Pulm: Good air movement, expiratory wheezing, saturating well on room air  CV: RRR, no murmurs Ext: No edema   Assessment/Plan:  Active Problems:   Type 2 diabetes mellitus ([facility])   Influenza A   COPD with acute exacerbation ([place])  68 year old woman with past medical history notable for COPD with FEV1 1.5 L (60% predicted), was admitted for acute on chronic COPD exacerbation secondary to influenza A infection.  She is hemodynamically stable but remains symptomatic with regard to her shortness of breath and persistent cough.  She denies myalgias but endorses associated fatigue and worsening shortness of breath with activity.  She will be treated with a 5-day course of Tamiflu, prednisone and ipratropium/albuterol nebulizers.  COPD exacerbation secondary to influenza A MSK right-sided rib pain 2/2 coughing - Patient continues to endorse dyspnea. She has good air movement and oxygenation on room air. She does have expiratory wheezing present. Her dyspnea appears to be partially due to anxiety.  - Her cough has not improved and exacerbates pain along her right ribs.  Plan - DuoNebs every 6hrs scheduled - Cough suppressant: guaifenesin-codeine 61ml  Q6 hours scheduled - Pain control: tylenol and ibuprofen prn as well as morphine 2mg  q4hrs prn - Continue home ellipta - Prednisone 40 mg daily (day 2/5) - Tamiflu 75 mg twice daily (day 2/5)  HTN: - BP 155/69 this am - Pt reports she has not been taking losartan-hctz at home Plan - Continue home amlodipine 5mg  daily and metoprolol 25mg  BID - Can increase amlodipine if pressures remain elevated  DMII - Last A1c 5.4 not on any current medications other than metformin. - Blood sugars currently within goal Plan - Hold metformin and monitor CBGs daily  Anxiety - Continue home cymbalta 60mg  daily and hydroxyzine 10mg  TID - Ativan 1mg  BID prn for anxiety - Check TSH  Dispo: Anticipated discharge in approximately 1-2 days.  Brigitta Pricer, Andree Elk, MD 01/15/2019, 6:09 AM Pager: [phone]

## 2019-01-16 ENCOUNTER — Ambulatory Visit: Payer: Medicare Other | Admitting: Pharmacist

## 2019-01-16 DIAGNOSIS — R197 Diarrhea, unspecified: Secondary | ICD-10-CM

## 2019-01-16 LAB — BASIC METABOLIC PANEL
Anion gap: 9 (ref 5–15)
BUN: 8 mg/dL (ref 8–23)
CO2: 28 mmol/L (ref 22–32)
Calcium: 8.4 mg/dL — ABNORMAL LOW (ref 8.9–10.3)
Chloride: 98 mmol/L (ref 98–111)
Creatinine, Ser: 0.67 mg/dL (ref 0.44–1.00)
GFR calc Af Amer: >60 mL/min (ref >60)
GFR calc non Af Amer: >60 mL/min (ref >60)
Glucose, Bld: 119 mg/dL — ABNORMAL HIGH (ref 70–99)
Potassium: 3.7 mmol/L (ref 3.5–5.1)
Sodium: 135 mmol/L (ref 135–145)

## 2019-01-16 MED ORDER — ALBUTEROL SULFATE (2.5 MG/3ML) 0.083% IN NEBU: 2.5000 mg | INHALATION_SOLUTION | RESPIRATORY_TRACT | Status: DC | PRN

## 2019-01-16 MED ORDER — LOPERAMIDE HCL 2 MG PO CAPS
2.0000 mg | ORAL_CAPSULE | ORAL | Status: DC | PRN
  Administered 2019-01-17: 2 mg via ORAL
  Filled 2019-01-16: qty 1

## 2019-01-16 NOTE — Discharge Summary (Signed)
Name: [name] MRN: [id number] DOB: [birth year] 68 y.o. PCP: Welford Roche, MD  Date of Admission: 01/14/2019 12:55 AM Date of Discharge: 01/20/2019 Attending Physician: Dr. Beryle Beams  Discharge Diagnosis: 1. COPD exacerbation 2/2 influenza A 2. MSK right-sided rib pain 2/2 coughing 3. Diarrhea 4. HTN 5. DMII 6. Anxiety  Discharge Medications: Allergies as of 01/20/2019      Reactions   Ace Inhibitors Swelling   Throat swelling. Lisinopril-HCTZ   Flonase [fluticasone] Other (See Comments)   Sinuses stop up and condition worsens      Medication List    STOP taking these medications   amoxicillin-clavulanate 875-125 MG tablet Commonly known as:  Augmentin   azithromycin 250 MG tablet Commonly known as:  ZITHROMAX   cetirizine 10 MG tablet Commonly known as:  ZYRTEC   clindamycin 1 % external solution Commonly known as:  Cleocin-T   losartan-hydrochlorothiazide 100-25 MG tablet Commonly known as:  HYZAAR     TAKE these medications   Accu-Chek FastClix Lancets Misc 1 each by Other route See admin instructions. Check blood sugar daily as needed for high blood sugar.   Accu-Chek Nano SmartView w/Device Kit 1 each by Other route See admin instructions. Check blood sugar up to 4 times daily as needed for high blood sugar. Code: E-11.9   albuterol 108 (90 Base) MCG/ACT inhaler Commonly known as:  PROVENTIL HFA;VENTOLIN HFA Inhale 2 puffs into the lungs every 6 (six) hours as needed for wheezing or shortness of breath.   amLODipine 10 MG tablet Commonly known as:  NORVASC Take 1 tablet (10 mg total) by mouth daily. Start taking on:  January 21, 2019 What changed:    medication strength  how much to take   DULoxetine 60 MG capsule Commonly known as:  CYMBALTA Take 1 capsule (60 mg total) by mouth daily.   fluticasone furoate-vilanterol 100-25 MCG/INH Aepb Commonly known as:  BREO ELLIPTA Inhale 1 puff into the lungs daily. Start taking  on:  January 21, 2019   glucose blood test strip Commonly known as:  Accu-Chek SmartView Use to check blood sugar 1 to 2 times daily. diag code E 11.9. Non- insulin dependent   hydrOXYzine 10 MG tablet Commonly known as:  ATARAX/VISTARIL Take 1 tablet (10 mg total) by mouth 3 (three) times daily as needed for itching or anxiety.   ipratropium-albuterol 0.5-2.5 (3) MG/3ML Soln Commonly known as:  DUONEB Take 3 mLs by nebulization every 6 (six) hours as needed. What changed:  reasons to take this   LORazepam 0.5 MG tablet Commonly known as:  ATIVAN Take 1 tablet (0.5 mg total) by mouth 2 (two) times daily as needed for anxiety.   metFORMIN 1000 MG tablet Commonly known as:  GLUCOPHAGE Take 1 tablet (1,000 mg total) by mouth 2 (two) times daily.   metoprolol tartrate 25 MG tablet Commonly known as:  LOPRESSOR Take 1 tablet (25 mg total) by mouth 2 (two) times daily.   predniSONE 20 MG tablet Commonly known as:  DELTASONE Take 1 tablet (20 mg total) by mouth daily with breakfast. What changed:  how much to take   predniSONE 10 MG tablet Commonly known as:  DELTASONE Take 1 tablet (10 mg total) by mouth daily. What changed:  You were already taking a medication with the same name, and this prescription was added. Make sure you understand how and when to take each.   predniSONE 10 MG tablet Commonly known as:  DELTASONE Take 1 tablet (10 mg total)  by mouth daily. What changed:  You were already taking a medication with the same name, and this prescription was added. Make sure you understand how and when to take each.   simvastatin 40 MG tablet Commonly known as:  ZOCOR Take 1 tablet (40 mg total) by mouth daily at 6 PM.       Disposition and follow-up:   [name] was discharged from [facility] in Good condition.  At the hospital follow up visit please address:  1. COPD exacerbation 2/2 influenza A - Discharged with prednisone taper to end  3/28 and inhaled corticosteroid - Ensure patient's symptoms have improved  2. Anxiety - Provided three days of ativan prn at discharge for anxiety with instructions to taper. Please assess patient's need for ongoing or longterm benzodiazepine therapy.  3.  Labs / imaging needed at time of follow-up: none  4.  Pending labs/ test needing follow-up: none  Follow-up Appointments: Follow-up Information    [place]. Schedule an appointment as soon as possible for a visit in 1 week(s).   Contact information: [place] [id number] [place] Hospital Course by problem list: 1. COPD exacerbation 2/2 influenza A and MSK right-sided rib pain 2/2 coughing: 68 year old woman with past medical history notable for COPD with FEV1 1.5 L (60% predicted), was admitted for acute on chronic COPD exacerbation secondary to influenza A infection. She was hemodynamically stable and did not require supplemental oxygen.She was treated with a 5-day course of Tamiflu, a long taper of prednisone, and bronchodilator nebulizers. She was discharged home upon improvement of symptoms. She was supplied with information to establish care with community health and wellness as she has been dismissed from the [facility].   2. Diarrhea: Diarrhea since February. May be 2/2 prior abx therapy or influenza. C. Diff negative. Treated symptomatically with imodium. Resolved.  3. HTN: Increased home amlodipine to 35m daily  4. Anxiety: Discharged home with 5 tablets of ativan prn. Continued on hydroxyzine 169mTID.  Discharge Vitals:   BP 131/71 (BP Location: Left Arm)   Pulse 70   Temp 97.7 F (36.5 C) (Oral)   Resp 14   Ht '5\' 6"'  (1.676 m)   Wt 91.2 kg   SpO2 100%   BMI 32.45 kg/m   Pertinent Labs, Studies, and Procedures:  CBC Latest Ref Rng & Units 01/14/2019 01/09/2019 01/02/2019  WBC 4.0 - 10.5 K/uL 12.5(H) 8.5 8.1  Hemoglobin 12.0 - 15.0 g/dL  13.6 13.8 13.5  Hematocrit 36.0 - 46.0 % 40.3 40.2 39.5  Platelets 150 - 400 K/uL 348 323 292   CMP Latest Ref Rng & Units 01/18/2019 01/17/2019 01/17/2019  Glucose 70 - 99 mg/dL 90 164(H) 162(H)  BUN 8 - 23 mg/dL '9 9 11  ' Creatinine 0.44 - 1.00 mg/dL 0.63 0.73 0.72  Sodium 135 - 145 mmol/L 139 134(L) 136  Potassium 3.5 - 5.1 mmol/L 3.7 3.4(L) 3.5  Chloride 98 - 111 mmol/L 102 99 98  CO2 22 - 32 mmol/L '29 25 28  ' Calcium 8.9 - 10.3 mg/dL 8.3(L) 8.0(L) 8.1(L)  Total Protein 6.5 - 8.1 g/dL - - -  Total Bilirubin 0.3 - 1.2 mg/dL - - -  Alkaline Phos 38 - 126 U/L - - -  AST 15 - 41 U/L - - -  ALT 0 - 44 U/L - - -   CXR 01/14/2019 Cardiac shadow is stable.  Aortic calcifications are again seen. The lungs are hyperinflated similar to that noted on the prior exam. Old rib fractures are seen bilaterally. No focal infiltrate or sizable effusion is seen. Degenerative changes of the thoracic spine are noted.  Discharge Instructions: Discharge Instructions    Discharge instructions   Complete by:  As directed    It was a pleasure taking care of you while you were in the hospital, [name]!  You were hospitalized for the flu and a COPD exacerbation. It may take a few more days to weeks for your symptoms to get better.  At home - Start taking the [facility] ellipta inhaler daily - Take prednisone 32m daily for the next 7 days (start tomorrow). Then decrease to prednisone 19mfor 7 more days. Then stop the prednisone.  - You can use your home nebulizers if you become short of breath - I have also prescribed three days worth of ativan if you have anxiety from your cough   [facility] for [facility].  Please call our clinic at [phone] you have any questions.  Thanks, Dr. DoAnnie Paras Increase activity slowly   Complete by:  As directed       Signed: Dorrell, DeAndree ElkMD 01/20/2019, 12:44 PM   Pager: [phone]

## 2019-01-16 NOTE — Addendum Note (Signed)
Addended by: Forde Dandy on: 01/16/2019 02:02 PM   Modules accepted: Orders

## 2019-01-16 NOTE — Progress Notes (Signed)
   Subjective: No overnight events. Ms. Balon reports she is still coughing, but is feeling somewhat better this morning. Still has some wheezing today. She endorses diarrhea with 3 loose BMs overnight. She has not tried imodium yet. Endorses continued pain in her ribs.   Objective:  Vital signs in last 24 hours: Vitals:   01/15/19 2137 01/16/19 0027 01/16/19 0208 01/16/19 0400  BP:  (!) 153/69    Pulse: 78 74 70   Resp:  20 18   Temp:  98.4 F (36.9 C)    TempSrc:  Oral    SpO2:  96% 99%   Weight:    91.2 kg  Height:    5\' 6"  (1.676 m)   General: awake, alert, sitting up in bed in NAD CV: RRR; no murmurs Pulm: good air movement, audible wheezing, expiratory wheezing diffusely, saturating well on room air Ext: No edema  Assessment/Plan:  Active Problems:   Type 2 diabetes mellitus (HCC)   Influenza A   COPD with acute exacerbation ([place])  68 year old woman with past medical history notable for COPD with FEV1 1.5 L (60% predicted), was admitted for acute on chronic COPD exacerbation secondary to influenza A infection. She is hemodynamically stable but remains symptomatic with regard to her shortness of breath and persistent cough. She denies myalgias but endorses associated fatigue and worsening shortness of breath with activity. She will be treated with a 5-day course of Tamiflu, prednisone and ipratropium/albuterol nebulizers.  COPD exacerbation secondary to influenza A MSK right-sided rib pain 2/2 coughing - Patient continues to endorse dyspnea. She has good air movement and oxygenation on room air. She does have expiratory wheezing present.  - Her cough has not improved and exacerbates pain along her right ribs.  Plan - Xopenex & ipratropium nebulizers q6hrs scheduled - Cough suppressant: guaifenesin-codeine80ml q6hrs scheduled - Pain control: tylenol and ibuprofen prn as well as morphine 2mg  q4hrs prn - Continue home ellipta inhalers - Prednisone 40 mg daily (day  3/5) - Tamiflu 75mg  twice daily (day 3/5) - Flutter valve  Diarrhea - Patient endorses diarrhea that has been present since she was discharged from the hospital last month. She was evaluated for this problem 3/3 in the [facility]. C. Diff was negative at that time.  Plan - Imodium prn  HTN: - BP 153/69 this am - Pt reports she has not been taking losartan-hctz at home Plan - Continue home amlodipine 5mg  daily and metoprolol 25mg  BID - Can increase amlodipine if pressures remain elevated after this acute illness  DMII - Last A1c 5.4 not on anycurrent medications other than metformin. - Blood sugars currently within goal Plan - Hold metformin and monitor CBGs daily  Anxiety - TSH within normal limits. Plan - Continue home cymbalta 60mg  daily and hydroxyzine 10mg  TID - Ativan 1mg  BID prn for anxiety  Dispo: Anticipated discharge in approximately 1-2 days  Rehema Muffley, Andree Elk, MD 01/16/2019, 6:05 AM Pager: [phone]

## 2019-01-16 NOTE — Plan of Care (Signed)
  Problem: Clinical Measurements: Goal: Respiratory complications will improve Outcome: Not Progressing   Problem: Nutrition: Goal: Adequate nutrition will be maintained Outcome: Progressing   Problem: Coping: Goal: Level of anxiety will decrease Outcome: Not Progressing   Problem: Pain Managment: Goal: General experience of comfort will improve Outcome: Not Progressing

## 2019-01-17 LAB — BASIC METABOLIC PANEL
Anion gap: 10 (ref 5–15)
Anion gap: 10 (ref 5–15)
BUN: 11 mg/dL (ref 8–23)
BUN: 9 mg/dL (ref 8–23)
CO2: 28 mmol/L (ref 22–32)
CO2: 25 mmol/L (ref 22–32)
CREATININE: 0.72 mg/dL (ref 0.44–1.00)
Calcium: 8.1 mg/dL — ABNORMAL LOW (ref 8.9–10.3)
Calcium: 8 mg/dL — ABNORMAL LOW (ref 8.9–10.3)
Chloride: 98 mmol/L (ref 98–111)
Chloride: 99 mmol/L (ref 98–111)
Creatinine, Ser: 0.73 mg/dL (ref 0.44–1.00)
GFR calc Af Amer: >60 mL/min (ref >60)
GFR calc Af Amer: >60 mL/min (ref >60)
GFR calc non Af Amer: >60 mL/min (ref >60)
GFR calc non Af Amer: >60 mL/min (ref >60)
Glucose, Bld: 162 mg/dL — ABNORMAL HIGH (ref 70–99)
Glucose, Bld: 164 mg/dL — ABNORMAL HIGH (ref 70–99)
POTASSIUM: 3.4 mmol/L — AB (ref 3.5–5.1)
Potassium: 3.5 mmol/L (ref 3.5–5.1)
Sodium: 136 mmol/L (ref 135–145)
Sodium: 134 mmol/L — ABNORMAL LOW (ref 135–145)

## 2019-01-17 MED ORDER — LIDOCAINE 5 % EX PTCH
1.0000 | MEDICATED_PATCH | CUTANEOUS | Status: DC
  Administered 2019-01-17 – 2019-01-19 (×3): 1 via TRANSDERMAL
  Filled 2019-01-17 (×3): qty 1

## 2019-01-17 MED ORDER — LEVALBUTEROL HCL 0.63 MG/3ML IN NEBU
0.6300 mg | INHALATION_SOLUTION | Freq: Four times a day (QID) | RESPIRATORY_TRACT | Status: DC | PRN
  Administered 2019-01-18: 0.63 mg via RESPIRATORY_TRACT
  Filled 2019-01-17 (×2): qty 3

## 2019-01-17 MED ORDER — ALUM & MAG HYDROXIDE-SIMETH 200-200-20 MG/5ML PO SUSP
30.0000 mL | Freq: Once | ORAL | Status: AC
  Administered 2019-01-17: 30 mL via ORAL
  Filled 2019-01-17: qty 30

## 2019-01-17 MED ORDER — LIDOCAINE VISCOUS HCL 2 % MT SOLN
15.0000 mL | Freq: Once | OROMUCOSAL | Status: AC
  Administered 2019-01-17: 15 mL via ORAL
  Filled 2019-01-17: qty 15

## 2019-01-17 MED ORDER — FLUTICASONE PROPIONATE 50 MCG/ACT NA SUSP
2.0000 | Freq: Every day | NASAL | Status: DC
  Administered 2019-01-17 – 2019-01-20 (×4): 2 via NASAL
  Filled 2019-01-17: qty 16

## 2019-01-17 MED ORDER — OXYCODONE HCL 5 MG PO TABS
2.5000 mg | ORAL_TABLET | ORAL | Status: DC | PRN
  Administered 2019-01-17: 2.5 mg via ORAL
  Filled 2019-01-17: qty 1

## 2019-01-17 NOTE — Progress Notes (Signed)
   Subjective: No overnight events. Ms. Sherley reports that her cough is the same as yesterday. Her chest pain has improved somewhat, but now she is experiencing a heartburn type pain as well. She also endorses nasal congestion and thick secretions. No other concerns at this time. All of her questions were answered.  Objective:  Vital signs in last 24 hours: Vitals:   01/16/19 1621 01/16/19 2100 01/17/19 0009 01/17/19 0250  BP: (!) 154/74  (!) 150/70   Pulse: 79 89 81 68  Resp: 16 14 20 18   Temp: 98.8 F (37.1 C)  (!) 97.5 F (36.4 C)   TempSrc: Oral  Oral   SpO2: 100% 96% 99% 97%  Weight:      Height:       Gen: lying comfortably in bed, no distress, frequently coughs HEENT: Normal oropharynx without erythema or exudate CV: Difficult to auscultate heart sounds due to loud wheezing Pulm: Diffuse wheezing, appears to be mostly upper airway wheezing Ext: No edema  Assessment/Plan:  Active Problems:   Type 2 diabetes mellitus (HCC)   Influenza A   COPD with acute exacerbation (HCC)  68 year old woman with past medical history notable for COPD with FEV1 1.5 L (60% predicted), was admitted for acute on chronic COPD exacerbation secondary to influenza A infection. She is hemodynamically stable but remains symptomatic with regard to her shortness of breath and persistent cough. She denies myalgias but endorses associated fatigue and worsening shortness of breath with activity. She will be treated with a 5-day course of Tamiflu, prednisone and ipratropium/albuterol nebulizers.  COPD exacerbation secondary to influenza A MSK right-sided rib pain 2/2 coughing -Patient continues to endorse dyspnea, but she is saturating well on room air. She has good air movement, but diffuse expiratory wheezing, mostly upper airway. - Her cough has not improved and exacerbates pain along her right ribs.She has new heartburn type pain today.  Plan  - Xopenex & ipratropium nebulizers  q6hrsscheduled -Cough suppressant: guaifenesin-codeine58ml q6hrsscheduled - Pain control: tylenol, ibuprofen, oxycodone 2.5mg  q4hrs prn, lidocaine patch - Continue home ellipta inhalers -Prednisone 40 mg daily(day 4/5) -Tamiflu 75mg  twice daily(day 4/5) - Flutter valve - Flonase - GI cocktail  Diarrhea - Still present with coughing. Patient has not yet tried imodium. Plan - Imodium prn  HTN: - BP150/70 this am Plan - Continue home amlodipine 5mg  daily and metoprolol 25mg  BID - Can increase amlodipine if pressures remain elevated after this acute illness  DMII -Last A1c 5.4 not on anycurrent medications other than metformin. - Blood sugars currently within goal Plan -Hold metformin and monitor CBGs daily  Anxiety - Continue home cymbalta 60mg  daily and hydroxyzine 10mg  TID - Ativan 1mg  BID prn for anxiety  Dispo: Anticipated discharge in approximately 1-2 day(s).   Teran Knittle, Andree Elk, MD 01/17/2019, 6:13 AM Pager: [phone]

## 2019-01-17 NOTE — Plan of Care (Signed)
  Problem: Education: Goal: Knowledge of General Education information will improve Description: Including pain rating scale, medication(s)/side effects and non-pharmacologic comfort measures Outcome: Progressing   Problem: Clinical Measurements: Goal: Respiratory complications will improve Outcome: Progressing   Problem: Nutrition: Goal: Adequate nutrition will be maintained Outcome: Progressing   Problem: Coping: Goal: Level of anxiety will decrease Outcome: Progressing   Problem: Pain Managment: Goal: General experience of comfort will improve Outcome: Progressing   

## 2019-01-18 LAB — BASIC METABOLIC PANEL
Anion gap: 8 (ref 5–15)
BUN: 9 mg/dL (ref 8–23)
CALCIUM: 8.3 mg/dL — AB (ref 8.9–10.3)
CO2: 29 mmol/L (ref 22–32)
Chloride: 102 mmol/L (ref 98–111)
Creatinine, Ser: 0.63 mg/dL (ref 0.44–1.00)
GFR calc Af Amer: >60 mL/min (ref >60)
GFR calc non Af Amer: >60 mL/min (ref >60)
Glucose, Bld: 90 mg/dL (ref 70–99)
Potassium: 3.7 mmol/L (ref 3.5–5.1)
Sodium: 139 mmol/L (ref 135–145)

## 2019-01-18 MED ORDER — LORAZEPAM 1 MG PO TABS
1.0000 mg | ORAL_TABLET | Freq: Three times a day (TID) | ORAL | Status: DC
  Administered 2019-01-18 – 2019-01-19 (×3): 1 mg via ORAL
  Filled 2019-01-18 (×3): qty 1

## 2019-01-18 MED ORDER — IPRATROPIUM-ALBUTEROL 0.5-2.5 (3) MG/3ML IN SOLN: 3.0000 mL | RESPIRATORY_TRACT | Status: DC | PRN

## 2019-01-18 MED ORDER — LEVALBUTEROL HCL 0.63 MG/3ML IN NEBU: 0.6300 mg | INHALATION_SOLUTION | RESPIRATORY_TRACT | Status: DC | PRN

## 2019-01-18 MED ORDER — AMLODIPINE BESYLATE 10 MG PO TABS
10.0000 mg | ORAL_TABLET | Freq: Every day | ORAL | Status: DC
  Administered 2019-01-18 – 2019-01-20 (×3): 10 mg via ORAL
  Filled 2019-01-18 (×3): qty 1

## 2019-01-18 MED ORDER — ALBUTEROL SULFATE (2.5 MG/3ML) 0.083% IN NEBU: 2.5000 mg | INHALATION_SOLUTION | RESPIRATORY_TRACT | Status: DC | PRN

## 2019-01-18 NOTE — Progress Notes (Signed)
   Subjective: No overnight events. Patient reports that she woke up feeling dyspneic and anxious this morning. She asked to be placed on oxygen for her comfort, but she was not hypoxic. Her cough is persistent, but her pain has improved. She states her diarrhea has also improved with imodium. All of her questions were addressed.   Objective:  Vital signs in last 24 hours: Vitals:   01/17/19 2100 01/17/19 2300 01/18/19 0110 01/18/19 0405  BP: (!) 172/84 (!) 158/72  (!) 167/73  Pulse:  72  78  Resp: (!) 22 19  20   Temp:  98.4 F (36.9 C)  98.2 F (36.8 C)  TempSrc:  Oral  Oral  SpO2:  95% 100% 100%  Weight:      Height:       Gen: lying comfortably in bed, no distress CV: RRR, no murmurs Pulm: upper airway wheezing, normal effort on 2L [place] Ext: no edema  Assessment/Plan:  Active Problems:   Type 2 diabetes mellitus (HCC)   Chronic obstructive pulmonary disease with bronchospasm (HCC)   Influenza A   COPD with acute exacerbation ([place])  68 year old woman with past medical history notable for COPD with FEV1 1.5 L (60% predicted), was admitted for acute on chronic COPD exacerbation secondary to influenza A infection. She is hemodynamically stable but remains symptomatic with regard to her shortness of breath and persistent cough.  COPD exacerbation secondary to influenza A MSK right-sided rib pain 2/2 coughing -Placed on oxygen for comfort this morning, no hypoxia. Persistent upper airway wheezing. Anxiety seems to be playing a role in her symptoms as well.  Plan  -Xopenex & ipratropium nebulizers q6hrsscheduled - Albuterol q6hrs prn between xopenex treatments -Cough suppressant: guaifenesin-codeine19mlq6hrsscheduled - Pain control: tylenol, ibuprofen, oxycodone 2.5mg  q4hrs prn, lidocaine patch - Continue home elliptainhalers -Prednisone 40 mg daily. Will extend course.  -Tamiflu 75mg  twice daily(day5/5) - Flutter valve - Flonase - Wean oxygen as tolerates   Diarrhea - Improved with imodium  HTN: - Elevated 916O-060O systolic Plan - Continue home metoprolol 25mg  BID - Increase home amlodipine from 5 to 10mg  daily   DMII -Last A1c 5.4 not on anycurrent medications other than metformin. - Blood sugars currently within goal Plan -Hold metformin and monitor CBGs daily  Anxiety - Playing a large role in her dyspneic episodes. May be partially caused by high frequency of nebulizer treatments, but patient has demonstrated anxiety at prior hospitalizations necessitating anxiolytics.  Plan - Schedule ativan 1mg  TID - Hold home hydroxyzine   Dispo: Not yet stable for discharge. Anticipated discharge in approximately 1-2 day(s).   Floria Brandau, Andree Elk, MD 01/18/2019, 6:12 AM Pager: [phone]

## 2019-01-18 NOTE — Care Management Important Message (Signed)
Important Message  Patient Details  Name: [name] MRN: [id number] Date of Birth: November 13, 1950   Medicare Important Message Given:  Yes    Carles Collet, RN 01/18/2019, 12:17 PM

## 2019-01-19 ENCOUNTER — Encounter: Payer: Self-pay | Admitting: Internal Medicine

## 2019-01-19 LAB — GLUCOSE, CAPILLARY: Glucose-Capillary: 84 mg/dL (ref 70–99)

## 2019-01-19 MED ORDER — LORAZEPAM 0.5 MG PO TABS
0.5000 mg | ORAL_TABLET | Freq: Three times a day (TID) | ORAL | Status: DC
  Administered 2019-01-19 – 2019-01-20 (×3): 0.5 mg via ORAL
  Filled 2019-01-19 (×3): qty 1

## 2019-01-19 NOTE — Progress Notes (Signed)
   Subjective: [name] appears improved today, but doesn't feel ready to go home today due to her breating. Cough is worse at night. She has nebulizer at home that she can use. Discussed plan for discharge tomorrow. Explained that she will take a few weeks to recover due to her COPD.  [name] is frustrated with how long she's been sick.   Objective:  Vital signs in last 24 hours: Vitals:   01/18/19 2344 01/19/19 0129 01/19/19 0730 01/19/19 0806  BP: (!) 161/78   (!) 161/76  Pulse: 68   64  Resp: 19   (!) 21  Temp: 98.8 F (37.1 C)   97.9 F (36.6 C)  TempSrc: Oral   Oral  SpO2: 97% 98% 98% 98%  Weight:      Height:       General: awake, alert, sitting up in bed in NAD CV: RRR; no murmurs Pulm: saturating well on room air; upper airway wheezing  Assessment/Plan:  Active Problems:   Type 2 diabetes mellitus (HCC)   Chronic obstructive pulmonary disease with bronchospasm (HCC)   Influenza A   COPD with acute exacerbation ([place])  68 year old woman with past medical history notable for COPD with FEV1 1.5 L (60% predicted), was admitted for acute on chronic COPD exacerbation secondary to influenza A infection. She is hemodynamically stable but remains symptomatic with regard to her shortness of breath and persistent cough.  COPD exacerbation secondary to influenza A MSK right-sided rib pain 2/2 coughing - Completed 5 day course of tamiflu -Persistent upper airway wheezing. Anxiety seems to be playing a role in her symptoms as well.  Plan  -Xopenex & ipratropium nebulizers q6hrsscheduled - Albuterol q6hrs prn between xopenex treatments -Cough suppressant: guaifenesin-codeine52mlq6hrsscheduled - Pain control: tylenol, ibuprofen, lidocaine patch - Continue home elliptainhalers (including inhaled corticosteroid) -Continue prednisone 40 mg daily (day 6) - Flutter valve - Flonase - Wean oxygen as tolerates  HTN: - Elevated 315V-761Y systolic Plan - Continue  home metoprolol 25mg  BID and amlodipine 10mg  daily  DMII -Last A1c 5.4 not on anycurrent medications other than metformin. - Blood sugars currently within goal Plan -Hold metformin and monitor CBGs daily  Anxiety - Improved today Plan - Decrease ativan to 0.5mg  TID - Hold home hydroxyzine   Dispo: Anticipated discharge tomorrow.  Myrth Dahan, Andree Elk, MD 01/19/2019, 12:01 PM Pager: [phone]

## 2019-01-19 NOTE — Progress Notes (Signed)
Medicine attending: I examined this patient today together with resident physician Dr. Modena Nunnery and I concur with her evaluation and management plan. She continues to slowly improve.  Decreased wheezing but cough and wheezing still present. We will discharge her tomorrow.

## 2019-01-20 ENCOUNTER — Other Ambulatory Visit (HOSPITAL_COMMUNITY): Payer: Self-pay | Admitting: Internal Medicine

## 2019-01-20 DIAGNOSIS — Z7952 Long term (current) use of systemic steroids: Secondary | ICD-10-CM

## 2019-01-20 DIAGNOSIS — Z7951 Long term (current) use of inhaled steroids: Secondary | ICD-10-CM

## 2019-01-20 LAB — GLUCOSE, CAPILLARY: Glucose-Capillary: 103 mg/dL — ABNORMAL HIGH (ref 70–99)

## 2019-01-20 MED ORDER — FLUTICASONE FUROATE-VILANTEROL 100-25 MCG/INH IN AEPB: 1.0000 | INHALATION_SPRAY | Freq: Every day | RESPIRATORY_TRACT | 0 refills | Status: DC

## 2019-01-20 MED ORDER — AMLODIPINE BESYLATE 10 MG PO TABS: 10.0000 mg | ORAL_TABLET | Freq: Every day | ORAL | Status: DC

## 2019-01-20 MED ORDER — PREDNISONE 10 MG PO TABS: 10.0000 mg | ORAL_TABLET | Freq: Every day | ORAL | 0 refills | Status: DC

## 2019-01-20 MED ORDER — LORAZEPAM 0.5 MG PO TABS: 0.5000 mg | ORAL_TABLET | Freq: Two times a day (BID) | ORAL | 0 refills | Status: DC | PRN

## 2019-01-20 MED ORDER — PREDNISONE 20 MG PO TABS: 20.0000 mg | ORAL_TABLET | Freq: Every day | ORAL | 0 refills | Status: DC

## 2019-01-20 MED ORDER — IPRATROPIUM BROMIDE 0.02 % IN SOLN
0.5000 mg | Freq: Four times a day (QID) | RESPIRATORY_TRACT | Status: DC | PRN
  Administered 2019-01-20: 0.5 mg via RESPIRATORY_TRACT
  Filled 2019-01-20 (×2): qty 2.5

## 2019-01-20 MED ORDER — AMLODIPINE BESYLATE 10 MG PO TABS: 10.0000 mg | ORAL_TABLET | Freq: Every day | ORAL | 0 refills | Status: DC

## 2019-01-20 MED ORDER — LEVALBUTEROL HCL 0.63 MG/3ML IN NEBU
0.6300 mg | INHALATION_SOLUTION | Freq: Four times a day (QID) | RESPIRATORY_TRACT | Status: DC | PRN
  Administered 2019-01-20: 0.63 mg via RESPIRATORY_TRACT
  Filled 2019-01-20 (×2): qty 3

## 2019-01-20 NOTE — Progress Notes (Signed)
   Subjective: No overnight events. The patient stated that she was feeling better today and is ready to go home. She stated that she has her nebulizer available at home. She denied additional questions.   Objective:  Vital signs in last 24 hours: Vitals:   01/19/19 1653 01/19/19 2024 01/19/19 2314 01/20/19 0110  BP: (!) 145/63  (!) 168/76   Pulse: 86  75   Resp: 16  20   Temp: 98.6 F (37 C)  97.9 F (36.6 C)   TempSrc: Oral  Oral   SpO2: 99% 98% 98% 98%  Weight:      Height:       Gen: lying comfortably in bed, no distress CV: RRR, no murmurs Pulm: Expiratory wheezing, mostly upper airway. Improved from prior.  Ext: no edema  Assessment/Plan:  Active Problems:   Type 2 diabetes mellitus (HCC)   Chronic obstructive pulmonary disease with bronchospasm (HCC)   Influenza A   COPD with acute exacerbation ([place])  68 year old woman with past medical history notable for COPD with FEV1 1.5 L (60% predicted), was admitted for acute on chronic COPD exacerbation secondary to influenza A infection. She is hemodynamically stable but remains symptomatic with regard to her shortness of breath and persistent cough.  COPD exacerbation secondary to influenza A MSK right-sided rib pain 2/2 coughing - Completed 5 day course of tamiflu -Persistent upper airway wheezing. Anxiety seems to be playing a role in her symptoms as well. - Stable for discharge home today with prednisone taper and home nebulizer treatments Plan  -Xopenex & ipratropium nebulizers q6hrsscheduled - Albuterol q6hrs prn between xopenex treatments -Cough suppressant: guaifenesin-codeine46mlq6hrsscheduled - Pain control: tylenol, ibuprofen, lidocaine patch - Continue inhaled corticosteroid -Continue prednisone 40 mg (day 7). Start taper on discharge  HTN - Continue home metoprolol 25mg  BID and amlodipine 10mg  daily  DMII -Last A1c 5.4 not on anycurrent medications other than metformin. - Blood sugars  currently within goal Plan -Hold metformin and monitor CBGs daily  Anxiety - Improved today Plan - Decrease ativan to 0.5mg  BID - Hold home hydroxyzine  Dispo: Anticipated discharge today  Cameron Katayama, Andree Elk, MD 01/20/2019, 6:47 AM Pager: [phone]

## 2019-01-20 NOTE — Plan of Care (Signed)
Education complete.

## 2019-02-02 ENCOUNTER — Telehealth (HOSPITAL_COMMUNITY): Payer: Self-pay | Admitting: *Deleted

## 2019-02-12 ENCOUNTER — Other Ambulatory Visit: Payer: Self-pay | Admitting: *Deleted

## 2019-02-12 DIAGNOSIS — I10 Essential (primary) hypertension: Secondary | ICD-10-CM

## 2019-02-12 MED ORDER — METOPROLOL TARTRATE 25 MG PO TABS: 25.0000 mg | ORAL_TABLET | Freq: Two times a day (BID) | ORAL | 2 refills | Status: DC

## 2019-02-13 ENCOUNTER — Other Ambulatory Visit: Payer: Self-pay

## 2019-02-13 DIAGNOSIS — J441 Chronic obstructive pulmonary disease with (acute) exacerbation: Secondary | ICD-10-CM

## 2019-02-13 MED ORDER — IPRATROPIUM-ALBUTEROL 0.5-2.5 (3) MG/3ML IN SOLN: 3.0000 mL | Freq: Four times a day (QID) | RESPIRATORY_TRACT | 3 refills | Status: DC | PRN

## 2019-02-13 NOTE — Telephone Encounter (Signed)
ipratropium-albuterol (DUONEB) 0.5-2.5 (3) MG/3ML SOLN, refill request @  [facility] [phone] - [place], [place] AT [address] [phone] (Phone) [phone] (Fax)

## 2019-02-18 ENCOUNTER — Emergency Department (HOSPITAL_COMMUNITY): Payer: Medicare Other

## 2019-02-18 ENCOUNTER — Encounter (HOSPITAL_COMMUNITY): Payer: Self-pay

## 2019-02-18 ENCOUNTER — Other Ambulatory Visit: Payer: Self-pay

## 2019-02-18 ENCOUNTER — Inpatient Hospital Stay (HOSPITAL_COMMUNITY)
Admission: EM | Admit: 2019-02-18 | Discharge: 2019-02-21 | DRG: 192 | Disposition: A | Discharge status: Home / Self Care | Payer: Medicare Other | Attending: Internal Medicine | Admitting: Internal Medicine

## 2019-02-18 DIAGNOSIS — Z79899 Other long term (current) drug therapy: Secondary | ICD-10-CM | POA: Diagnosis not present

## 2019-02-18 DIAGNOSIS — Z6831 Body mass index (BMI) 31.0-31.9, adult: Secondary | ICD-10-CM

## 2019-02-18 DIAGNOSIS — Z9221 Personal history of antineoplastic chemotherapy: Secondary | ICD-10-CM

## 2019-02-18 DIAGNOSIS — R000 Tachycardia, unspecified: Secondary | ICD-10-CM | POA: Diagnosis present

## 2019-02-18 DIAGNOSIS — R0781 Pleurodynia: Secondary | ICD-10-CM | POA: Diagnosis not present

## 2019-02-18 DIAGNOSIS — Z923 Personal history of irradiation: Secondary | ICD-10-CM | POA: Diagnosis not present

## 2019-02-18 DIAGNOSIS — Z720 Tobacco use: Secondary | ICD-10-CM | POA: Diagnosis present

## 2019-02-18 DIAGNOSIS — F329 Major depressive disorder, single episode, unspecified: Secondary | ICD-10-CM | POA: Diagnosis present

## 2019-02-18 DIAGNOSIS — K5900 Constipation, unspecified: Secondary | ICD-10-CM | POA: Diagnosis present

## 2019-02-18 DIAGNOSIS — F419 Anxiety disorder, unspecified: Secondary | ICD-10-CM | POA: Diagnosis present

## 2019-02-18 DIAGNOSIS — E119 Type 2 diabetes mellitus without complications: Secondary | ICD-10-CM | POA: Diagnosis not present

## 2019-02-18 DIAGNOSIS — Z8701 Personal history of pneumonia (recurrent): Secondary | ICD-10-CM | POA: Diagnosis not present

## 2019-02-18 DIAGNOSIS — Z809 Family history of malignant neoplasm, unspecified: Secondary | ICD-10-CM

## 2019-02-18 DIAGNOSIS — K219 Gastro-esophageal reflux disease without esophagitis: Secondary | ICD-10-CM | POA: Diagnosis present

## 2019-02-18 DIAGNOSIS — E669 Obesity, unspecified: Secondary | ICD-10-CM | POA: Diagnosis present

## 2019-02-18 DIAGNOSIS — F17210 Nicotine dependence, cigarettes, uncomplicated: Secondary | ICD-10-CM | POA: Diagnosis present

## 2019-02-18 DIAGNOSIS — E785 Hyperlipidemia, unspecified: Secondary | ICD-10-CM | POA: Diagnosis present

## 2019-02-18 DIAGNOSIS — E876 Hypokalemia: Secondary | ICD-10-CM | POA: Diagnosis present

## 2019-02-18 DIAGNOSIS — R05 Cough: Secondary | ICD-10-CM | POA: Diagnosis not present

## 2019-02-18 DIAGNOSIS — F411 Generalized anxiety disorder: Secondary | ICD-10-CM | POA: Diagnosis present

## 2019-02-18 DIAGNOSIS — R0902 Hypoxemia: Secondary | ICD-10-CM | POA: Diagnosis present

## 2019-02-18 DIAGNOSIS — Z853 Personal history of malignant neoplasm of breast: Secondary | ICD-10-CM | POA: Diagnosis not present

## 2019-02-18 DIAGNOSIS — J441 Chronic obstructive pulmonary disease with (acute) exacerbation: Principal | ICD-10-CM | POA: Diagnosis present

## 2019-02-18 DIAGNOSIS — Z888 Allergy status to other drugs, medicaments and biological substances status: Secondary | ICD-10-CM

## 2019-02-18 DIAGNOSIS — R0789 Other chest pain: Secondary | ICD-10-CM | POA: Diagnosis not present

## 2019-02-18 DIAGNOSIS — G8929 Other chronic pain: Secondary | ICD-10-CM | POA: Diagnosis not present

## 2019-02-18 DIAGNOSIS — E1122 Type 2 diabetes mellitus with diabetic chronic kidney disease: Secondary | ICD-10-CM | POA: Diagnosis present

## 2019-02-18 DIAGNOSIS — Z7984 Long term (current) use of oral hypoglycemic drugs: Secondary | ICD-10-CM | POA: Diagnosis not present

## 2019-02-18 DIAGNOSIS — Z20828 Contact with and (suspected) exposure to other viral communicable diseases: Secondary | ICD-10-CM | POA: Diagnosis not present

## 2019-02-18 DIAGNOSIS — F17200 Nicotine dependence, unspecified, uncomplicated: Secondary | ICD-10-CM | POA: Diagnosis present

## 2019-02-18 DIAGNOSIS — I10 Essential (primary) hypertension: Secondary | ICD-10-CM | POA: Diagnosis not present

## 2019-02-18 DIAGNOSIS — R0602 Shortness of breath: Secondary | ICD-10-CM | POA: Diagnosis not present

## 2019-02-18 DIAGNOSIS — Z8709 Personal history of other diseases of the respiratory system: Secondary | ICD-10-CM | POA: Diagnosis not present

## 2019-02-18 LAB — CBC WITH DIFFERENTIAL/PLATELET
Abs Immature Granulocytes: 0.02 10*3/uL (ref 0.00–0.07)
Basophils Absolute: 0 10*3/uL (ref 0.0–0.1)
Basophils Relative: 0 %
Eosinophils Absolute: 0.1 10*3/uL (ref 0.0–0.5)
Eosinophils Relative: 2 %
HCT: 38.3 % (ref 36.0–46.0)
Hemoglobin: 13.1 g/dL (ref 12.0–15.0)
Immature Granulocytes: 0 %
Lymphocytes Relative: 36 %
Lymphs Abs: 3 10*3/uL (ref 0.7–4.0)
MCH: 30.6 pg (ref 26.0–34.0)
MCHC: 34.2 g/dL (ref 30.0–36.0)
MCV: 89.5 fL (ref 80.0–100.0)
Monocytes Absolute: 0.5 10*3/uL (ref 0.1–1.0)
Monocytes Relative: 6 %
Neutro Abs: 4.7 10*3/uL (ref 1.7–7.7)
Neutrophils Relative %: 56 %
Platelets: 317 10*3/uL (ref 150–400)
RBC: 4.28 MIL/uL (ref 3.87–5.11)
RDW: 12.4 % (ref 11.5–15.5)
WBC: 8.4 10*3/uL (ref 4.0–10.5)
nRBC: 0 % (ref 0.0–0.2)

## 2019-02-18 LAB — COMPREHENSIVE METABOLIC PANEL
ALT: 10 U/L (ref 0–44)
AST: 17 U/L (ref 15–41)
Albumin: 3.6 g/dL (ref 3.5–5.0)
Alkaline Phosphatase: 96 U/L (ref 38–126)
Anion gap: 13 (ref 5–15)
BUN: 7 mg/dL — ABNORMAL LOW (ref 8–23)
CO2: 29 mmol/L (ref 22–32)
Calcium: 9.2 mg/dL (ref 8.9–10.3)
Chloride: 95 mmol/L — ABNORMAL LOW (ref 98–111)
Creatinine, Ser: 0.83 mg/dL (ref 0.44–1.00)
GFR calc Af Amer: >60 mL/min (ref >60)
GFR calc non Af Amer: >60 mL/min (ref >60)
Glucose, Bld: 111 mg/dL — ABNORMAL HIGH (ref 70–99)
Potassium: 3 mmol/L — ABNORMAL LOW (ref 3.5–5.1)
Sodium: 137 mmol/L (ref 135–145)
Total Bilirubin: 0.9 mg/dL (ref 0.3–1.2)
Total Protein: 7.1 g/dL (ref 6.5–8.1)

## 2019-02-18 LAB — RESPIRATORY PANEL BY PCR

## 2019-02-18 LAB — LACTIC ACID, PLASMA
Lactic Acid, Venous: 2.2 mmol/L (ref 0.5–1.9)
Lactic Acid, Venous: 1.5 mmol/L (ref 0.5–1.9)

## 2019-02-18 LAB — MAGNESIUM: Magnesium: 1.5 mg/dL — ABNORMAL LOW (ref 1.7–2.4)

## 2019-02-18 MED ORDER — IPRATROPIUM BROMIDE HFA 17 MCG/ACT IN AERS
2.0000 | INHALATION_SPRAY | RESPIRATORY_TRACT | Status: DC
  Administered 2019-02-19 (×5): 2 via RESPIRATORY_TRACT
  Filled 2019-02-18: qty 12.9

## 2019-02-18 MED ORDER — PREDNISONE 20 MG PO TABS
40.0000 mg | ORAL_TABLET | Freq: Every day | ORAL | Status: DC
  Administered 2019-02-19 – 2019-02-21 (×3): 40 mg via ORAL
  Filled 2019-02-18 (×3): qty 2

## 2019-02-18 MED ORDER — SODIUM CHLORIDE 0.9 % IV SOLN
1.0000 g | INTRAVENOUS | Status: DC
  Administered 2019-02-18 – 2019-02-20 (×3): 1 g via INTRAVENOUS
  Filled 2019-02-18 (×2): qty 10
  Filled 2019-02-18: qty 1
  Filled 2019-02-18: qty 10

## 2019-02-18 MED ORDER — IPRATROPIUM-ALBUTEROL 0.5-2.5 (3) MG/3ML IN SOLN: 3.0000 mL | Freq: Four times a day (QID) | RESPIRATORY_TRACT | Status: DC

## 2019-02-18 MED ORDER — METHYLPREDNISOLONE SODIUM SUCC 125 MG IJ SOLR
125.0000 mg | Freq: Once | INTRAMUSCULAR | Status: AC
  Administered 2019-02-18: 125 mg via INTRAVENOUS
  Filled 2019-02-18: qty 2

## 2019-02-18 MED ORDER — DULOXETINE HCL 60 MG PO CPEP
60.0000 mg | ORAL_CAPSULE | Freq: Every day | ORAL | Status: DC
  Administered 2019-02-19 – 2019-02-21 (×3): 60 mg via ORAL
  Filled 2019-02-18 (×3): qty 1

## 2019-02-18 MED ORDER — ACETAMINOPHEN 650 MG RE SUPP: 650.0000 mg | Freq: Four times a day (QID) | RECTAL | Status: DC | PRN

## 2019-02-18 MED ORDER — ALBUTEROL SULFATE HFA 108 (90 BASE) MCG/ACT IN AERS
4.0000 | INHALATION_SPRAY | RESPIRATORY_TRACT | Status: DC | PRN
  Administered 2019-02-18 – 2019-02-21 (×5): 4 via RESPIRATORY_TRACT
  Filled 2019-02-18: qty 6.7

## 2019-02-18 MED ORDER — KETOROLAC TROMETHAMINE 30 MG/ML IJ SOLN
15.0000 mg | Freq: Once | INTRAMUSCULAR | Status: AC
  Administered 2019-02-18: 15 mg via INTRAVENOUS
  Filled 2019-02-18: qty 1

## 2019-02-18 MED ORDER — SIMVASTATIN 20 MG PO TABS
40.0000 mg | ORAL_TABLET | Freq: Every day | ORAL | Status: DC
  Administered 2019-02-19 – 2019-02-21 (×3): 40 mg via ORAL
  Filled 2019-02-18 (×3): qty 2

## 2019-02-18 MED ORDER — ACETAMINOPHEN 325 MG PO TABS
650.0000 mg | ORAL_TABLET | Freq: Four times a day (QID) | ORAL | Status: DC | PRN
  Administered 2019-02-19 – 2019-02-20 (×3): 650 mg via ORAL
  Filled 2019-02-18 (×3): qty 2

## 2019-02-18 MED ORDER — POTASSIUM CHLORIDE CRYS ER 20 MEQ PO TBCR
40.0000 meq | EXTENDED_RELEASE_TABLET | Freq: Four times a day (QID) | ORAL | Status: AC
  Administered 2019-02-18 – 2019-02-19 (×2): 40 meq via ORAL
  Filled 2019-02-18 (×2): qty 2

## 2019-02-18 MED ORDER — METOPROLOL TARTRATE 25 MG PO TABS
25.0000 mg | ORAL_TABLET | Freq: Two times a day (BID) | ORAL | Status: DC
  Administered 2019-02-19 – 2019-02-21 (×5): 25 mg via ORAL
  Filled 2019-02-18 (×5): qty 1

## 2019-02-18 MED ORDER — ENOXAPARIN SODIUM 40 MG/0.4ML ~~LOC~~ SOLN
40.0000 mg | SUBCUTANEOUS | Status: DC
  Administered 2019-02-19 – 2019-02-21 (×3): 40 mg via SUBCUTANEOUS
  Filled 2019-02-18 (×3): qty 0.4

## 2019-02-18 NOTE — H&P (Addendum)
Date: 02/18/2019               Patient Name:  [name] MRN: [id number]  DOB: [birth year] Age / Sex: 68 y.o., female   PCP: Patient, No Pcp Per         Medical Service: Internal Medicine Teaching Service         Attending Physician: Dr. Rebeca Alert, Raynaldo Opitz, MD    First Contact: Dr. Linna Hoff Pager: [phone]  Second Contact: Dr. Lorella Nimrod Pager: [phone]       After Hours (After 5p/  First Contact Pager: [phone]  weekends / holidays): Second Contact Pager: [phone]   Chief Complaint: Shortness of breath  History of Present Illness:  [name] is a 67 yo F w/ PMH of COPD, HTN, T2DM presenting with difficulty breathing and coughing which have been ongoing since the time of discharge from recent hospitalization in March for COPD exacerbation 2/2 influenza. She was noted to be tearful and anxious during exam. She said that during her last hospitalization she felt improved but not back to baseline at the time of discharge and her respiratory status gradually worsened over the last couple weeks. She was discharged with 14 day course of prednisone, which she took as prescribed with minimal relief. She continued to endorse cold sweats, nasal congestion, rhinorrhea, loss of appetite and productive cough with white-yellow sputum. She states her current symptoms are very similar to her initial presenting symptoms in March. In the past few days she has been using her rescue inhaler four times per day so she came to ED for evaluation.  She denies any recent sick contact. She mentions that in mid-March her husband had cold-like symptoms with cough, malaise and subjective fever but all of his symptoms resolved by 3/17-18ish. She states he works as Sports coach at the [employer]. She denies any travel and states she has been mostly home-bound. She states her daughter visits occasionally and has seasonal allergies but no other symptoms.  In th ED, she was found to have  desaturation event down to 89 during ambulation. She received breathing treatments and steroids with minimal relief. IMTS was called for admission.  Meds:  Current Meds  Medication Sig  . amLODipine (NORVASC) 10 MG tablet TAKE 1 TABLET BY MOUTH DAILY (Patient taking differently: Take 10 mg by mouth daily. )  . DULoxetine (CYMBALTA) 60 MG capsule Take 1 capsule (60 mg total) by mouth daily.  Marland Kitchen LORazepam (ATIVAN) 0.5 MG tablet Take 1 tablet (0.5 mg total) by mouth 2 (two) times daily as needed for anxiety.  . metFORMIN (GLUCOPHAGE) 1000 MG tablet Take 1 tablet (1,000 mg total) by mouth 2 (two) times daily.  . metoprolol tartrate (LOPRESSOR) 25 MG tablet Take 1 tablet (25 mg total) by mouth 2 (two) times daily.   Allergies: Allergies as of 02/18/2019 - Review Complete 02/18/2019  Allergen Reaction Noted  . Ace inhibitors Swelling 12/26/2014  . Flonase [fluticasone] Other (See Comments) 12/25/2014   Past Medical History:  Diagnosis Date  . Anxiety   . Arthritis    "fingers, knees" (08/16/2018)  . Asthma   . Cancer of right breast ([place]) 1991   s/p lumpectomy, chemotherapy and radiation therapy in 1991. Mammogram in 2007 was normal.  . Constipated    h/o  . COPD (chronic obstructive pulmonary disease) ([place])    History of multiple hospital admissions for exercabation   . COPD with exacerbation ([place]) 04/06/2009   Qualifier: Diagnosis of  By:  Eyvonne Mechanic MD, Doyne Keel    . Depression   . Diarrhea    h/o  . GERD (gastroesophageal reflux disease)   . Headache    "a few times/month" (08/16/2018  . Heart murmur 10/05/11   "first time I ever heard I had one was today"  . Hyperlipidemia   . Hypertension   . Obesity   . Personal history of chemotherapy   . Personal history of radiation therapy   . Pneumonia    "couple times in the last 10-15 yrs" (08/16/2018)  . Shortness of breath 10/05/11   "at rest; lying down; w/exertion"  . Sigmoid diverticulitis 80/2008  . Tobacco abuse   . Type II  diabetes mellitus (HCC)    Family History:  Family History  Problem Relation Age of Onset  . Cancer Mother    Social History:  Lives at home with husband who works as Sports coach at [employer]. Denies travel. Husband had URI symptoms 2 weeks ago. No other sick contact she is aware of.  Social History   Tobacco Use  . Smoking status: Current Every Day Smoker    Packs/day: 0.50    Years: 45.00    Pack years: 22.50    Types: Cigarettes  . Smokeless tobacco: Never Used  Substance Use Topics  . Alcohol use: Yes    Alcohol/week: 4.0 standard drinks    Types: 4 Cans of beer per week    Comment: 08/16/2018 "weekends only"  . Drug use: No   Review of Systems: A complete ROS was negative except as per HPI.  Physical Exam: Blood pressure 126/63, pulse (!) 130, temperature 99.7 F (37.6 C), temperature source Oral, resp. rate (!) 30, height 5\' 2"  (1.575 m), weight 91 kg, SpO2 (!) 89 %. Physical Exam  Constitutional: She is oriented to person, place, and time. She appears distressed (very anxious and tearful).  HENT:  Mouth/Throat: Oropharynx is clear and moist. No oropharyngeal exudate.  Neck: Normal range of motion. Neck supple.  Cardiovascular: Regular rhythm, normal heart sounds and intact distal pulses.  Tachycardic  Pulmonary/Chest: Effort normal. She has wheezes (bilateral lung field expiratory wheezes). She has no rales.  Prolonged expiratory phase  Abdominal: Soft. Bowel sounds are normal. There is no abdominal tenderness. There is no guarding.  Musculoskeletal: Normal range of motion.        General: No tenderness or edema.  Lymphadenopathy:    She has no cervical adenopathy.  Neurological: She is alert and oriented to person, place, and time.  Psychiatric:  Tearful   EKG: personally reviewed my interpretation is sinus tachycardia, normal axis, no significant ST changes from prior, significant artifacts  CXR: personally reviewed my interpretation is no lobar  consolidation, no pleural effusion, no vascular congestion, flattened diaphragm.  Assessment & Plan by Problem: Active Problems:   COPD exacerbation ([place])  [name] is a 68 yo F w/ PMH of COPD, HTN, T2DM presenting with dyspnea with wheezing and cough consistent with COPD exacerbation. Her prior episode was triggered by influenza A and she appears to have failed outpatient prednisone therapy after discharge. Currently she has no oxygen requirement but she has significant wheezing on exam despite albuterol and solumedrol treatment. Cannot rule out possibility of co-infection with different viral illness, such as COVID-19 although history and clinical picture indicates low probability.  Dyspnea with wheezing and cough 2/2 COPD exacerbation vs viral / atypical pneumonia Influenza A positive 01/14/2019. Chest X-ray show no significant signs of pneumonia. Last PFT in 06/23/15 show FEV/FVC  ratio 57% w/ FVC 94% predicted, FEV1 68% w/o response to bronh consistent with GOLD B severity COPD with grade 2 obstruction at baseline. - Start prednisone 40mg  qdaily - Albuterol hand-held inhaler q6hr PRN for wheezing - Ceftriaxone 1g daily - C/w home med: Breo Ellipta 1 puff daily  Hypokalemia 2/2 albuterol use Admit K 3.0 - K-dur 43mEq x2 doses - Trend BMP  HTN Current bp 126/63 - c/w home meds: amlodipine 10mg  qdaily, metoprolol tartrate 25mg  qdaily  T2DM Last a1c 5.7 12/2018 - Holding home metformin during inpatient stay  Anxiety - c/w home meds: duloxetine 60mg  daily  DVT prophx: Lovenox Diet: Regular Bowel: Senokot Code: Full  Dispo: Admit patient to Inpatient with expected length of stay greater than 2 midnights.  Signed: Mosetta Anis, MD 02/18/2019, 9:15 PM  Pager: [phone]

## 2019-02-18 NOTE — ED Notes (Signed)
While ambulating pt became very SOB and began to have audible wheezing.  Sats dropped to 89% on RA while ambulating in hallways. Sats now 100% on 2 [place].

## 2019-02-18 NOTE — ED Notes (Signed)
ED TO INPATIENT HANDOFF REPORT  ED Nurse Name and Phone #: Eddie Candle [phone]  S Name/Age/Gender [name] 68 y.o. female Room/Bed: 024C/024C  Code Status   Code Status: Prior  Home/SNF/Other Home Patient oriented to: self, place, time and situation Is this baseline? Yes   Triage Complete: Triage complete  Chief Complaint ssob/coughing  Triage Note Pt states was hospitalized 2 weeks ago for flu. States has gotten worse since DC. Worsening productive cough with yellow thick sputum, exp wheeze throughout and audible in room. No smell or taste. Obviously dyspneic at rest, taking breathes every 2-3 words. Fever 99-100. Placed on 2L [place] to assist with discomfort of SOB. Hx: asthma, nebulizers at home not helping   Allergies Allergies  Allergen Reactions  . Ace Inhibitors Swelling    Throat swelling. Lisinopril-HCTZ  . Flonase [Fluticasone] Other (See Comments)    Sinuses stop up and condition worsens    Level of Care/Admitting Diagnosis ED Disposition    ED Disposition Condition [facility] Area: [place] [[id number]]  Level of Care: Telemetry Medical [104]  Diagnosis: COPD exacerbation [facility]) [[id number]]  Admitting Physician: Oda Kilts [[id number]]  Attending Physician: Oda Kilts [[id number]]  Estimated length of stay: 3 - 4 days  Certification:: I certify this patient will need inpatient services for at least 2 midnights  PT Class (Do Not Modify): Inpatient [101]  PT Acc Code (Do Not Modify): Private [1]       B Medical/Surgery History Past Medical History:  Diagnosis Date  . Anxiety   . Arthritis    "fingers, knees" (08/16/2018)  . Asthma   . Cancer of right breast ([place]) 1991   s/p lumpectomy, chemotherapy and radiation therapy in 1991. Mammogram in 2007 was normal.  . Constipated    h/o  . COPD (chronic obstructive pulmonary disease) ([place])    History of multiple hospital admissions for  exercabation   . COPD with exacerbation ([place]) 04/06/2009   Qualifier: Diagnosis of  By: Eyvonne Mechanic MD, V[place]    . Depression   . Diarrhea    h/o  . GERD (gastroesophageal reflux disease)   . Headache    "a few times/month" (08/16/2018  . Heart murmur 10/05/11   "first time I ever heard I had one was today"  . Hyperlipidemia   . Hypertension   . Obesity   . Personal history of chemotherapy   . Personal history of radiation therapy   . Pneumonia    "couple times in the last 10-15 yrs" (08/16/2018)  . Shortness of breath 10/05/11   "at rest; lying down; w/exertion"  . Sigmoid diverticulitis 80/2008  . Tobacco abuse   . Type II diabetes mellitus ([place])    Past Surgical History:  Procedure Laterality Date  . ABDOMINAL HYSTERECTOMY    . ANTERIOR CERVICAL DECOMP/DISCECTOMY FUSION  2012   "Dr. Lynann Bologna  put plate in; did something to my vertebrae"  . BACK SURGERY    . BREAST LUMPECTOMY Right 1991  . DOBUTAMINE STRESS ECHO  08/2004   Inferior ischemia, normal LV systolic function, no significant CAD     A IV Location/Drains/Wounds Patient Lines/Drains/Airways Status   Active Line/Drains/Airways    Name:   Placement date:   Placement time:   Site:   Days:   External Urinary Catheter   01/14/19    0747    -   35          Intake/Output Last 24  hours No intake or output data in the 24 hours ending 02/18/19 1928  Labs/Imaging Results for orders placed or performed during the hospital encounter of 02/18/19 (from the past 48 hour(s))  Comprehensive metabolic panel     Status: Abnormal   Collection Time: 02/18/19  3:00 PM  Result Value Ref Range   Sodium 137 135 - 145 mmol/L   Potassium 3.0 (L) 3.5 - 5.1 mmol/L   Chloride 95 (L) 98 - 111 mmol/L   CO2 29 22 - 32 mmol/L   Glucose, Bld 111 (H) 70 - 99 mg/dL   BUN 7 (L) 8 - 23 mg/dL   Creatinine, Ser 0.83 0.44 - 1.00 mg/dL   Calcium 9.2 8.9 - 10.3 mg/dL   Total Protein 7.1 6.5 - 8.1 g/dL   Albumin 3.6 3.5 - 5.0 g/dL   AST 17 15 -  41 U/L   ALT 10 0 - 44 U/L   Alkaline Phosphatase 96 38 - 126 U/L   Total Bilirubin 0.9 0.3 - 1.2 mg/dL   GFR calc non Af Amer >60 >60 mL/min   GFR calc Af Amer >60 >60 mL/min   Anion gap 13 5 - 15    Comment: Performed at [facility], [address] [place], [place] [address]  CBC with Differential     Status: None   Collection Time: 02/18/19  3:00 PM  Result Value Ref Range   WBC 8.4 4.0 - 10.5 K/uL   RBC 4.28 3.87 - 5.11 MIL/uL   Hemoglobin 13.1 12.0 - 15.0 g/dL   HCT 38.3 36.0 - 46.0 %   MCV 89.5 80.0 - 100.0 fL   MCH 30.6 26.0 - 34.0 pg   MCHC 34.2 30.0 - 36.0 g/dL   RDW 12.4 11.5 - 15.5 %   Platelets 317 150 - 400 K/uL   nRBC 0.0 0.0 - 0.2 %   Neutrophils Relative % 56 %   Neutro Abs 4.7 1.7 - 7.7 K/uL   Lymphocytes Relative 36 %   Lymphs Abs 3.0 0.7 - 4.0 K/uL   Monocytes Relative 6 %   Monocytes Absolute 0.5 0.1 - 1.0 K/uL   Eosinophils Relative 2 %   Eosinophils Absolute 0.1 0.0 - 0.5 K/uL   Basophils Relative 0 %   Basophils Absolute 0.0 0.0 - 0.1 K/uL   Immature Granulocytes 0 %   Abs Immature Granulocytes 0.02 0.00 - 0.07 K/uL    Comment: Performed at [facility], [address] [place], [place] [address]  Lactic acid, plasma     Status: Abnormal   Collection Time: 02/18/19  3:01 PM  Result Value Ref Range   Lactic Acid, Venous 2.2 (HH) 0.5 - 1.9 mmol/L    Comment: CRITICAL RESULT CALLED TO, READ BACK BY AND VERIFIED WITH: Carlus Pavlov AT 1534 ON [id number] BY Marcos Eke Performed at [facility], [place] [address] [place], [place] [address]   Lactic acid, plasma     Status: None   Collection Time: 02/18/19  5:35 PM  Result Value Ref Range   Lactic Acid, Venous 1.5 0.5 - 1.9 mmol/L    Comment: Performed at [facility] [address] [place], [place] [address]   Dg Chest Portable 1 View  Result Date: 02/18/2019 CLINICAL DATA:  Shortness of breath, productive cough. EXAM: PORTABLE CHEST 1 VIEW COMPARISON:  Radiographs of January 14, 2019. FINDINGS: The heart size and mediastinal contours are within normal limits. Both lungs are  clear. No pneumothorax or pleural effusion is noted. Old right rib fractures are noted. Hyperexpansion of the lungs is again noted. IMPRESSION: No acute cardiopulmonary abnormality seen. Hyperexpansion lung seen suggesting chronic obstructive pulmonary disease. Electronically Signed   By: Marijo Conception, M.D.   On: 02/18/2019 16:18    Pending Labs Unresulted Labs (From admission, onward)    Start     Ordered   02/18/19 1501  Blood culture (routine x 2)  BLOOD CULTURE X 2,   STAT     02/18/19 1501          Vitals/Pain Today's Vitals   02/18/19 1630 02/18/19 1645 02/18/19 1700 02/18/19 1817  BP: 137/61 (!) 142/59 (!) 121/58   Pulse: (!) 101 (!) 102 (!) 109 (!) 130  Resp: (!) 23 (!) 26 (!) 22 (!) 30  Temp:      TempSrc:      SpO2: 94% 95% 98% (!) 89%  Weight:      Height:      PainSc:        Isolation Precautions No active isolations  Medications Medications  albuterol (PROVENTIL HFA;VENTOLIN HFA) 108 (90 Base) MCG/ACT inhaler 4 puff (4 puffs Inhalation Given 02/18/19 1519)  methylPREDNISolone sodium succinate (SOLU-MEDROL) 125 mg/2 mL injection 125 mg (125 mg Intravenous Given 02/18/19 1517)  ketorolac (TORADOL) 30 MG/ML injection 15 mg (15 mg Intravenous Given 02/18/19 1724)    Mobility walks Low fall risk   Focused Assessments Pulmonary Assessment Handoff:  Lung sounds: L Breath Sounds: Expiratory wheezes, Diminished R Breath Sounds: Expiratory wheezes, Diminished O2 Device: Room Air        R Recommendations: See Admitting Provider Note  Report given to:   Additional Notes:  Pt recently hospitalized 2 weeks ago with flu a. States worsening SOB and wheezing since home despite neb and inhalers. Low grade temp 99, WBC WNL, lactic 2.2, blood cultures pending. 20 G LAC, 22 RW both saline locked. A/O x 4, independent to [facility], would need standby assist walking in halls  as she gets very SOB with amb.

## 2019-02-18 NOTE — ED Provider Notes (Signed)
[facility] EMERGENCY DEPARTMENT Provider Note   CSN: [id number] Arrival date & time: 02/18/19  1417    History   Chief Complaint Chief Complaint  Patient presents with  . Cough  . Shortness of Breath  . Fever  . Headache    HPI [name] is a 68 y.o. female.     HPI Patient presents with shortness of breath.  History of COPD.  Discharge from hospital what appears to be around a month ago.  States she was not feeling well when she was discharged.  Gotten worse over the last week.  Cough with some yellow sputum.  States she has difficulty walking or doing other things.  States has had fevers at home up to 100 degrees.  States her nebulizers have not been helping at home.  Has some dull chest pain.  Dull headache. Past Medical History:  Diagnosis Date  . Anxiety   . Arthritis    "fingers, knees" (08/16/2018)  . Asthma   . Cancer of right breast ([place]) 1991   s/p lumpectomy, chemotherapy and radiation therapy in 1991. Mammogram in 2007 was normal.  . Constipated    h/o  . COPD (chronic obstructive pulmonary disease) ([place])    History of multiple hospital admissions for exercabation   . COPD with exacerbation ([place]) 04/06/2009   Qualifier: Diagnosis of  By: Eyvonne Mechanic MD, Vijay    . Depression   . Diarrhea    h/o  . GERD (gastroesophageal reflux disease)   . Headache    "a few times/month" (08/16/2018  . Heart murmur 10/05/11   "first time I ever heard I had one was today"  . Hyperlipidemia   . Hypertension   . Obesity   . Personal history of chemotherapy   . Personal history of radiation therapy   . Pneumonia    "couple times in the last 10-15 yrs" (08/16/2018)  . Shortness of breath 10/05/11   "at rest; lying down; w/exertion"  . Sigmoid diverticulitis 80/2008  . Tobacco abuse   . Type II diabetes mellitus [facility]: [facility] Rhonda W)     Patient Active Problem List   Diagnosis Date Noted  . Hypomagnesemia   . Influenza A 01/14/2019  . COPD with acute  exacerbation ([place]) 01/14/2019  . Acute frontal sinusitis 01/09/2019  . Anxiety and depression 01/09/2019  . Abscess of head 01/09/2019  . COPD exacerbation ([place]) 01/01/2019  . Earlobe lesion, left 12/26/2018  . Type 2 diabetes mellitus with complication, without long-term current use of insulin ([place]) 12/26/2018  . Axillary hidradenitis suppurativa 10/13/2018  . Dizziness 09/21/2018  . Lower extremity edema 09/21/2018  . Irregular cardiac rhythm 08/25/2018  . Chronic recurrent sinusitis   . Alopecia of scalp 07/25/2018  . Bruising 06/21/2018  . Weight loss 06/21/2018  . Itching 06/21/2018  . Atopic dermatitis 03/20/2018  . Seasonal allergies 02/25/2017  . Diarrhea   . Chronic obstructive pulmonary disease with bronchospasm ([place]) 12/24/2014  . QT prolongation 08/08/2014  . Dyslipidemia associated with type 2 diabetes mellitus ([place]) 06/14/2014  . Hypokalemia 12/20/2012  . History of breast cancer 12/01/2012  . Primary osteoarthritis of right knee 09/29/2012  . GAD (generalized anxiety disorder) 12/30/2009  . Type 2 diabetes mellitus ([place]) 05/14/2009  . Tobacco abuse 04/06/2009  . Hypertension, benign essential, goal below 140/90 04/06/2009    Past Surgical History:  Procedure Laterality Date  . ABDOMINAL HYSTERECTOMY    . ANTERIOR CERVICAL DECOMP/DISCECTOMY FUSION  2012   "Dr. Lynann Bologna  put plate  in; did something to my vertebrae"  . BACK SURGERY    . BREAST LUMPECTOMY Right 1991  . DOBUTAMINE STRESS ECHO  08/2004   Inferior ischemia, normal LV systolic function, no significant CAD     OB History   No obstetric history on file.      Home Medications    Prior to Admission medications   Medication Sig Start Date End Date Taking? Authorizing Provider  ACCU-CHEK FASTCLIX LANCETS MISC 1 each by Other route See admin instructions. Check blood sugar daily as needed for high blood sugar.    [provider]  albuterol (PROVENTIL HFA;VENTOLIN HFA) 108 (90 Base)  MCG/ACT inhaler Inhale 2 puffs into the lungs every 6 (six) hours as needed for wheezing or shortness of breath. 08/10/18   Forde Dandy, PharmD  amLODipine (NORVASC) 10 MG tablet TAKE 1 TABLET BY MOUTH DAILY 01/23/19   Welford Roche, MD  Blood Glucose Monitoring Suppl (ACCU-CHEK NANO SMARTVIEW) w/Device KIT 1 each by Other route See admin instructions. Check blood sugar up to 4 times daily as needed for high blood sugar. Code: E-11.9 09/20/18   Asencion Noble, MD  DULoxetine (CYMBALTA) 60 MG capsule Take 1 capsule (60 mg total) by mouth daily. 01/09/19   Seawell, Jaimie A, DO  fluticasone furoate-vilanterol (BREO ELLIPTA) 100-25 MCG/INH AEPB Inhale 1 puff into the lungs daily. 01/21/19   Dorrell, Andree Elk, MD  glucose blood (ACCU-CHEK SMARTVIEW) test strip Use to check blood sugar 1 to 2 times daily. diag code E 11.9. Non- insulin dependent 10/02/14   Sid Falcon, MD  hydrOXYzine (ATARAX/VISTARIL) 10 MG tablet Take 1 tablet (10 mg total) by mouth 3 (three) times daily as needed for itching or anxiety. Patient not taking: Reported on 01/14/2019 01/02/19   Asencion Noble, MD  ipratropium-albuterol (DUONEB) 0.5-2.5 (3) MG/3ML SOLN Take 3 mLs by nebulization every 6 (six) hours as needed. 02/13/19   [name], MD  LORazepam (ATIVAN) 0.5 MG tablet Take 1 tablet (0.5 mg total) by mouth 2 (two) times daily as needed for anxiety. 01/20/19   Dorrell, Andree Elk, MD  metFORMIN (GLUCOPHAGE) 1000 MG tablet Take 1 tablet (1,000 mg total) by mouth 2 (two) times daily. 11/24/18   Bartholomew Crews, MD  metoprolol tartrate (LOPRESSOR) 25 MG tablet Take 1 tablet (25 mg total) by mouth 2 (two) times daily. 02/12/19   Bartholomew Crews, MD  predniSONE (DELTASONE) 10 MG tablet Take 1 tablet (10 mg total) by mouth daily. 01/20/19   Dorrell, Andree Elk, MD  predniSONE (DELTASONE) 10 MG tablet Take 1 tablet (10 mg total) by mouth daily. 01/20/19   Dorrell, Andree Elk, MD  predniSONE (DELTASONE) 20 MG  tablet Take 1 tablet (20 mg total) by mouth daily with breakfast. 01/20/19   Dorrell, Andree Elk, MD  simvastatin (ZOCOR) 40 MG tablet Take 1 tablet (40 mg total) by mouth daily at 6 PM. 01/11/19   Welford Roche, MD    Family History Family History  Problem Relation Age of Onset  . Cancer Mother     Social History Social History   Tobacco Use  . Smoking status: Current Every Day Smoker    Packs/day: 0.50    Years: 45.00    Pack years: 22.50    Types: Cigarettes  . Smokeless tobacco: Never Used  Substance Use Topics  . Alcohol use: Yes    Alcohol/week: 4.0 standard drinks    Types: 4 Cans of beer per week  Comment: 08/16/2018 "weekends only"  . Drug use: No     Allergies   Ace inhibitors and Flonase [fluticasone]   Review of Systems Review of Systems  Constitutional: Positive for appetite change and fever.  HENT: Negative for congestion.   Respiratory: Positive for cough, shortness of breath and wheezing.   Cardiovascular: Positive for chest pain.  Gastrointestinal: Negative for abdominal pain.  Genitourinary: Negative for flank pain.  Musculoskeletal: Negative for back pain.  Skin: Negative for rash.  Neurological: Negative for weakness.  Psychiatric/Behavioral: Negative for confusion.     Physical Exam Updated Vital Signs BP (!) 158/86   Pulse (!) 113   Temp 99.7 F (37.6 C) (Oral)   Resp (!) 22   Ht '5\' 2"'  (1.575 m)   Wt 91 kg   SpO2 100%   BMI 36.69 kg/m   Physical Exam HENT:     Head: Normocephalic.  Eyes:     Pupils: Pupils are equal, round, and reactive to light.  Cardiovascular:     Rate and Rhythm: Tachycardia present.  Pulmonary:     Effort: Tachypnea present.     Breath sounds: Wheezing present.     Comments: Patient is dyspneic and is on nasal cannula oxygen. Musculoskeletal:     Right lower leg: No edema.     Left lower leg: No edema.  Skin:    General: Skin is warm.  Neurological:     General: No focal deficit present.      Mental Status: She is alert.      ED Treatments / Results  Labs (all labs ordered are listed, but only abnormal results are displayed) Labs Reviewed  CULTURE, BLOOD (ROUTINE X 2)  CULTURE, BLOOD (ROUTINE X 2)  COMPREHENSIVE METABOLIC PANEL  CBC WITH DIFFERENTIAL/PLATELET  LACTIC ACID, PLASMA  LACTIC ACID, PLASMA    EKG EKG Interpretation  Date/Time:  Sunday February 18 2019 14:27:53 EDT Ventricular Rate:  116 PR Interval:    QRS Duration: 95 QT Interval:  342 QTC Calculation: 476 R Axis:   73 Text Interpretation:  Sinus tachycardia Borderline ST depression, diffuse leads Minimal ST elevation, lateral leads Baseline wander in lead(s) I III aVL aVF V2 V3 Confirmed by Davonna Belling [phone]) on 02/18/2019 3:08:58 PM   Radiology No results found.  Procedures Procedures (including critical care time)  Medications Ordered in ED Medications  albuterol (PROVENTIL HFA;VENTOLIN HFA) 108 (90 Base) MCG/ACT inhaler 4 puff (has no administration in time range)  methylPREDNISolone sodium succinate (SOLU-MEDROL) 125 mg/2 mL injection 125 mg (125 mg Intravenous Given 02/18/19 1517)     Initial Impression / Assessment and Plan / ED Course  I have reviewed the triage vital signs and the nursing notes.  Pertinent labs & imaging results that were available during my care of the patient were reviewed by me and considered in my medical decision making (see chart for details).        Patient with shortness of breath and cough.  Has been worse over the last week.  Ambulated and sats went down to 89%.  Now requiring oxygen.  Not placed on oxygen requirement.  Has been given breathing treatments and steroids.  Will discuss with internal medicine residents, who are the patient's primary for the next week until she has been dismissed from their service.  [name] was evaluated in Emergency Department on 02/18/2019 for the symptoms described in the history of present illness. She was  evaluated in the context of the global COVID-19 pandemic,  which necessitated consideration that the patient might be at risk for infection with the SARS-CoV-2 virus that causes COVID-19. Institutional protocols and algorithms that pertain to the evaluation of patients at risk for COVID-19 are in a state of rapid change based on information released by regulatory bodies including the CDC and federal and state organizations. These policies and algorithms were followed during the patient's care in the ED.  Final Clinical Impressions(s) / ED Diagnoses   Final diagnoses:  None    ED Discharge Orders    None       Davonna Belling, MD 02/18/19 [phone]

## 2019-02-18 NOTE — ED Triage Notes (Signed)
Pt states was hospitalized 2 weeks ago for flu. States has gotten worse since DC. Worsening productive cough with yellow thick sputum, exp wheeze throughout and audible in room. No smell or taste. Obviously dyspneic at rest, taking breathes every 2-3 words. Fever 99-100. Placed on 2L [place] to assist with discomfort of SOB. Hx: asthma, nebulizers at home not helping

## 2019-02-19 DIAGNOSIS — E119 Type 2 diabetes mellitus without complications: Secondary | ICD-10-CM

## 2019-02-19 DIAGNOSIS — Z8709 Personal history of other diseases of the respiratory system: Secondary | ICD-10-CM

## 2019-02-19 DIAGNOSIS — E876 Hypokalemia: Secondary | ICD-10-CM

## 2019-02-19 DIAGNOSIS — Z888 Allergy status to other drugs, medicaments and biological substances status: Secondary | ICD-10-CM

## 2019-02-19 DIAGNOSIS — Z7984 Long term (current) use of oral hypoglycemic drugs: Secondary | ICD-10-CM

## 2019-02-19 DIAGNOSIS — J441 Chronic obstructive pulmonary disease with (acute) exacerbation: Principal | ICD-10-CM

## 2019-02-19 DIAGNOSIS — Z79899 Other long term (current) drug therapy: Secondary | ICD-10-CM

## 2019-02-19 DIAGNOSIS — Z20828 Contact with and (suspected) exposure to other viral communicable diseases: Secondary | ICD-10-CM

## 2019-02-19 DIAGNOSIS — I10 Essential (primary) hypertension: Secondary | ICD-10-CM

## 2019-02-19 DIAGNOSIS — F419 Anxiety disorder, unspecified: Secondary | ICD-10-CM

## 2019-02-19 DIAGNOSIS — F17210 Nicotine dependence, cigarettes, uncomplicated: Secondary | ICD-10-CM

## 2019-02-19 LAB — BASIC METABOLIC PANEL
Anion gap: 12 (ref 5–15)
BUN: 15 mg/dL (ref 8–23)
CO2: 26 mmol/L (ref 22–32)
Calcium: 8.9 mg/dL (ref 8.9–10.3)
Chloride: 95 mmol/L — ABNORMAL LOW (ref 98–111)
Creatinine, Ser: 0.9 mg/dL (ref 0.44–1.00)
GFR calc Af Amer: >60 mL/min (ref >60)
GFR calc non Af Amer: >60 mL/min (ref >60)
Glucose, Bld: 278 mg/dL — ABNORMAL HIGH (ref 70–99)
Potassium: 4.8 mmol/L (ref 3.5–5.1)
Sodium: 133 mmol/L — ABNORMAL LOW (ref 135–145)

## 2019-02-19 LAB — SARS CORONAVIRUS 2 BY RT PCR (HOSPITAL ORDER, PERFORMED IN ~~LOC~~ HOSPITAL LAB): SARS Coronavirus 2: NEGATIVE

## 2019-02-19 MED ORDER — RAMELTEON 8 MG PO TABS
8.0000 mg | ORAL_TABLET | Freq: Every day | ORAL | Status: DC
  Administered 2019-02-19 – 2019-02-20 (×2): 8 mg via ORAL
  Filled 2019-02-19 (×5): qty 1

## 2019-02-19 MED ORDER — RAMELTEON 8 MG PO TABS
8.0000 mg | ORAL_TABLET | Freq: Once | ORAL | Status: AC
  Administered 2019-02-19: 8 mg via ORAL
  Filled 2019-02-19: qty 1

## 2019-02-19 MED ORDER — ALBUTEROL SULFATE HFA 108 (90 BASE) MCG/ACT IN AERS
2.0000 | INHALATION_SPRAY | Freq: Three times a day (TID) | RESPIRATORY_TRACT | Status: DC
  Administered 2019-02-20: 2 via RESPIRATORY_TRACT
  Filled 2019-02-19: qty 6.7

## 2019-02-19 MED ORDER — HYDROCOD POLST-CPM POLST ER 10-8 MG/5ML PO SUER
5.0000 mL | Freq: Two times a day (BID) | ORAL | Status: DC | PRN
  Administered 2019-02-19 – 2019-02-21 (×3): 5 mL via ORAL
  Filled 2019-02-19 (×3): qty 5

## 2019-02-19 MED ORDER — IPRATROPIUM BROMIDE HFA 17 MCG/ACT IN AERS
2.0000 | INHALATION_SPRAY | Freq: Three times a day (TID) | RESPIRATORY_TRACT | Status: DC
  Administered 2019-02-20: 2 via RESPIRATORY_TRACT
  Filled 2019-02-19: qty 12.9

## 2019-02-19 MED ORDER — HYDROXYZINE HCL 10 MG PO TABS: 10.0000 mg | ORAL_TABLET | Freq: Three times a day (TID) | ORAL | Status: DC | PRN

## 2019-02-19 MED ORDER — MAGNESIUM SULFATE 2 GM/50ML IV SOLN
2.0000 g | Freq: Once | INTRAVENOUS | Status: AC
  Administered 2019-02-19: 2 g via INTRAVENOUS
  Filled 2019-02-19: qty 50

## 2019-02-19 NOTE — Progress Notes (Addendum)
  Date: 02/19/2019  Patient name: [name]  Medical record number: [id number]  Date of birth: Oct 26, 1951    Subjective: Reports she still feels "sick as a dog".  Still having some difficulty breathing and coughing fits.  Objective:  Vital signs in last 24 hours: Vitals:   02/18/19 2145 02/18/19 2245 02/19/19 0048 02/19/19 0749  BP: (!) 146/79 (!) 136/55 126/85 (!) 148/70  Pulse: (!) 102  (!) 113 94  Resp: (!) 23     Temp:   98.8 F (37.1 C) 98.7 F (37.1 C)  TempSrc:   Oral Oral  SpO2: 100%  100% 97%  Weight:   86.5 kg   Height:   5\' 2"  (1.575 m)     Physical Exam: General: Sitting up in bed, appears comfortable, able to speak in complete sentences HEENT: No significant nasal congestion, moist mucous membranes Heart: Tachycardic to about 110, regular,, no murmurs Lungs: Bilateral wheezing with prolonged expiratory phase and coughing fits triggered by deep breathing, normal respiratory effort Abdomen: Soft, nontender, normal bowel sounds Extremities: No swelling  Significant new test results: Respiratory pathogen panel negative COVID-19 testing pending  Assessment/Plan:  Principal Problem:   COPD exacerbation (HCC) Active Problems:   Type 2 diabetes mellitus (HCC)   Tobacco abuse   GAD (generalized anxiety disorder)   Hypertension, benign essential, goal below 140/90   Hypokalemia  68 year old woman with history of COPD, DM, and HTN admitted with recurrent versus persistent COPD exacerbation.  She reports she did not improve back to baseline after her last admission a month ago for influenza-induced COPD exacerbation.  Her daughter works in a group home and her husband has been sick with URI type symptoms, so she has possible exposures to COVID-19, but overall suspicion is low given her clinical course.  On exam, she appears comfortable but with pronounced wheezing and coughing fits and had some hypoxia in the ED.  #Acute exacerbation of COPD: Likely viral  trigger, possibly COVID-19 or other, RPP negative. -Continue prednisone 40 mg daily - Continue ceftriaxone 1 g daily - Continue albuterol and ipratropium inhalers, will try to avoid nebulizers in case she is COVID-19 positive - O2 as needed for sat < 90% -Significant rib pain with coughing, will start chlorpheniramine/hydrocodone suspension  #Hypokalemia: Repleting, also likely diminished by albuterol use, will recheck tomorrow  #HTN - c/w home meds: amlodipine 10mg  qdaily, metoprolol tartrate 25mg  qdaily  #T2DM Last a1c 5.7 12/2018 - Holding home metformin during inpatient  #Anxiety - c/w home meds: duloxetine 60mg  daily - ramelteon for sleep  DVT prophylaxis: Lovenox Diet: Will change to diabetic Bowel: Senokot Code: Full Dispo: Anticipated discharge in approximately 1-2 day(s).   [name], M.D., Ph.D. 02/19/2019, 11:53 AM

## 2019-02-19 NOTE — Progress Notes (Signed)
Patient is alert and oriented with at bedside report patient expressed the need for IV blood draw due to limited vein access. Clarified with IV team that it was not a IV to draw blood from.

## 2019-02-19 NOTE — TOC Initial Note (Signed)
Transition of Care [facility]) - Initial/Assessment Note    Patient Details  Name: [name] MRN: [id number] Date of Birth: December 07, 1950  [facility] CM/SW Contact:    Zenon Mayo, RN Phone Number: 02/19/2019, 2:35 PM  Clinical Narrative:                 From home with spouse, she states she has transportation at discharge, she has medication coverage, she states she can not go to internal medicine clinic any longer because they told her she missed too many apts, but she does not remember apts she missed.  She states they informed her she will need to make apt at the [place] clinic,  This NCM made a tele health visit for her on 4/22 at 10:30, informed patient this will be a telephone visit.   Expected Discharge Plan: Home/Self Care Barriers to Discharge: No Barriers Identified   Patient Goals and CMS Choice Patient states their goals for this hospitalization and ongoing recovery are:: to go home get better   Choice offered to / list presented to : NA  Expected Discharge Plan and Services Expected Discharge Plan: Home/Self Care In-house Referral: PCP / [facility] Connect Discharge Planning Services: CM Consult Post Acute Care Choice: NA Living arrangements for the past 2 months: Single Family Home                 DME Arranged: N/A DME Agency: NA HH Arranged: NA HH Agency: NA  Prior Living Arrangements/Services Living arrangements for the past 2 months: Single Family Home Lives with:: Spouse Patient language and need for interpreter reviewed:: No Do you feel safe going back to the place where you live?: Yes      Need for Family Participation in Patient Care: No (Comment) Care giver support system in place?: No (comment)   Criminal Activity/Legal Involvement Pertinent to Current Situation/Hospitalization: No - Comment as needed  Activities of Daily Living Home Assistive Devices/Equipment: None ADL Screening (condition at time of admission) Patient's  cognitive ability adequate to safely complete daily activities?: Yes Is the patient deaf or have difficulty hearing?: No Does the patient have difficulty seeing, even when wearing glasses/contacts?: No Does the patient have difficulty concentrating, remembering, or making decisions?: No Patient able to express need for assistance with ADLs?: Yes Does the patient have difficulty dressing or bathing?: No Independently performs ADLs?: Yes (appropriate for developmental age) Does the patient have difficulty walking or climbing stairs?: No Weakness of Legs: None Weakness of Arms/Hands: None  Permission Sought/Granted                  Emotional Assessment   Attitude/Demeanor/Rapport: Engaged Affect (typically observed): Accepting, Appropriate Orientation: : Oriented to Self, Oriented to Place, Oriented to  Time, Oriented to Situation      Admission diagnosis:  COPD exacerbation ([place]) [J44.1] Patient Active Problem List   Diagnosis Date Noted  . Hypomagnesemia   . Influenza A 01/14/2019  . COPD with acute exacerbation ([place]) 01/14/2019  . Acute frontal sinusitis 01/09/2019  . Anxiety and depression 01/09/2019  . Abscess of head 01/09/2019  . COPD exacerbation ([place]) 01/01/2019  . Earlobe lesion, left 12/26/2018  . Type 2 diabetes mellitus with complication, without long-term current use of insulin ([place]) 12/26/2018  . Axillary hidradenitis suppurativa 10/13/2018  . Dizziness 09/21/2018  . Lower extremity edema 09/21/2018  . Irregular cardiac rhythm 08/25/2018  . Chronic recurrent sinusitis   . Alopecia of scalp 07/25/2018  . Bruising 06/21/2018  .  Weight loss 06/21/2018  . Itching 06/21/2018  . Atopic dermatitis 03/20/2018  . Seasonal allergies 02/25/2017  . Diarrhea   . Chronic obstructive pulmonary disease with bronchospasm ([place]) 12/24/2014  . QT prolongation 08/08/2014  . Dyslipidemia associated with type 2 diabetes mellitus ([place]) 06/14/2014  . Hypokalemia 12/20/2012   . History of breast cancer 12/01/2012  . Primary osteoarthritis of right knee 09/29/2012  . GAD (generalized anxiety disorder) 12/30/2009  . Type 2 diabetes mellitus ([place]) 05/14/2009  . Tobacco abuse 04/06/2009  . Hypertension, benign essential, goal below 140/90 04/06/2009   PCP:  Patient, No Pcp Per Pharmacy:   [facility] [place], [address] [place] [address] Phone: [phone] Fax: [phone]  [facility] [phone] - [place], [place] - [address] [place] [address] Phone: [phone] Fax: [phone]  [name] [place], [place] - [address] [place] [address] Phone: [phone] Fax: [phone]     Social Determinants of Health ([place]) Interventions    Readmission Risk Interventions Readmission Risk Prevention Plan 02/19/2019  Transportation Screening Complete  PCP or Specialist Appt within 5-7 Days Complete  Home Care Screening Not Complete  Home Care Screening Not Completed Comments NA  Medication Review (RN CM) Complete  Some recent data might be hidden

## 2019-02-20 DIAGNOSIS — R0781 Pleurodynia: Secondary | ICD-10-CM

## 2019-02-20 DIAGNOSIS — G8929 Other chronic pain: Secondary | ICD-10-CM

## 2019-02-20 LAB — BASIC METABOLIC PANEL
Anion gap: 11 (ref 5–15)
BUN: 10 mg/dL (ref 8–23)
CO2: 28 mmol/L (ref 22–32)
Calcium: 8.8 mg/dL — ABNORMAL LOW (ref 8.9–10.3)
Chloride: 99 mmol/L (ref 98–111)
Creatinine, Ser: 0.88 mg/dL (ref 0.44–1.00)
GFR calc Af Amer: >60 mL/min (ref >60)
GFR calc non Af Amer: >60 mL/min (ref >60)
Glucose, Bld: 147 mg/dL — ABNORMAL HIGH (ref 70–99)
Potassium: 4 mmol/L (ref 3.5–5.1)
Sodium: 138 mmol/L (ref 135–145)

## 2019-02-20 LAB — GLUCOSE, CAPILLARY
Glucose-Capillary: 204 mg/dL — ABNORMAL HIGH (ref 70–99)
Glucose-Capillary: 236 mg/dL — ABNORMAL HIGH (ref 70–99)
Glucose-Capillary: 210 mg/dL — ABNORMAL HIGH (ref 70–99)
Glucose-Capillary: 267 mg/dL — ABNORMAL HIGH (ref 70–99)

## 2019-02-20 LAB — MAGNESIUM: Magnesium: 1.8 mg/dL (ref 1.7–2.4)

## 2019-02-20 MED ORDER — INSULIN ASPART 100 UNIT/ML ~~LOC~~ SOLN
0.0000 [IU] | Freq: Every day | SUBCUTANEOUS | Status: DC
  Administered 2019-02-20: 3 [IU] via SUBCUTANEOUS

## 2019-02-20 MED ORDER — INSULIN ASPART 100 UNIT/ML ~~LOC~~ SOLN
0.0000 [IU] | Freq: Three times a day (TID) | SUBCUTANEOUS | Status: DC
  Administered 2019-02-20 – 2019-02-21 (×3): 5 [IU] via SUBCUTANEOUS

## 2019-02-20 MED ORDER — OXYCODONE-ACETAMINOPHEN 5-325 MG PO TABS
1.0000 | ORAL_TABLET | Freq: Four times a day (QID) | ORAL | Status: DC | PRN
  Administered 2019-02-20 – 2019-02-21 (×2): 2 via ORAL
  Filled 2019-02-20 (×2): qty 2

## 2019-02-20 MED ORDER — IPRATROPIUM-ALBUTEROL 0.5-2.5 (3) MG/3ML IN SOLN
3.0000 mL | Freq: Three times a day (TID) | RESPIRATORY_TRACT | Status: DC
  Administered 2019-02-21 (×3): 3 mL via RESPIRATORY_TRACT
  Filled 2019-02-20 (×3): qty 3

## 2019-02-20 MED ORDER — IPRATROPIUM-ALBUTEROL 0.5-2.5 (3) MG/3ML IN SOLN
3.0000 mL | Freq: Four times a day (QID) | RESPIRATORY_TRACT | Status: DC
  Administered 2019-02-20 (×2): 3 mL via RESPIRATORY_TRACT
  Filled 2019-02-20 (×2): qty 3

## 2019-02-20 NOTE — Progress Notes (Signed)
SATURATION QUALIFICATIONS: (This note is used to comply with regulatory documentation for home oxygen)  Patient Saturations on Room Air at Rest =98%  Patient Saturations on Room Air while Ambulating = 100%  Patient Saturations on 0 Liters of oxygen while Ambulating = 100%  Please briefly explain why patient needs home oxygen: Patient did not require o2 while ambulating the length of the entire unit.

## 2019-02-20 NOTE — Progress Notes (Signed)
Patient complains of being hungry with tremors checked blood sugar and results 204mg /dl  Patient usually takes metformin and is currently taking oral steroids for COPD.

## 2019-02-20 NOTE — Progress Notes (Signed)
Inpatient Diabetes Program Recommendations  AACE/ADA: New Consensus Statement on Inpatient Glycemic Control  Target Ranges:  Prepandial:   less than 140 mg/dL      Peak postprandial:   less than 180 mg/dL (1-2 hours)      Critically ill patients:  140 - 180 mg/dL  Results for [name], [name] (MRN [id number]) as of 02/20/2019 09:08  Ref. Range 02/19/2019 19:14 02/20/2019 06:32  Glucose Latest Ref Range: 70 - 99 mg/dL 278 (H) 147 (H)   Results for [name], [name] (MRN [id number]) as of 02/20/2019 09:08  Ref. Range 02/20/2019 05:15  Glucose-Capillary Latest Ref Range: 70 - 99 mg/dL 204 (H)   Review of Glycemic Control  Diabetes history: DM2 Outpatient Diabetes medications: Metformin 1000 mg BID Current orders for Inpatient glycemic control: NONE: Prednisone 40 mg QAM  Inpatient Diabetes Program Recommendations:   Correction (SSI): Please consider ordering CBGs ACHS with Novolog 0-15 units TID with meals and Novolog 0-5 units QHS.  Thanks, Barnie Alderman, RN, MSN, CDE Diabetes Coordinator Inpatient Diabetes Program [phone] (Team Pager from 8am to 5pm)

## 2019-02-20 NOTE — Progress Notes (Signed)
   Subjective: Patient was seen and evaluated at bedside on morning rounds.  She mentions that she only received IV antibiotic once since she got here.  We inform her that she has received that every 24 hours as recorded.  She also mentions that she is still coughing, also ask if we can give her nebulizer as it works better for her.  She asks for pain medication for her rib pain. Asking about her PCP, she expresses that she has been dismissed fro [facility] due to missing 3 appointments. She is upset about that.   Objective:  Vital signs in last 24 hours: Vitals:   02/20/19 0855 02/20/19 1026 02/20/19 1028 02/20/19 1230  BP:  127/73 127/73 140/77  Pulse:  74 76 72  Resp:   18 16  Temp:    98.2 F (36.8 C)  TempSrc:    Oral  SpO2: 98%  100% 100%  Weight:      Height:       Ph/E: General: Sitting in the bed in no acute distress CV: RRR, normal S1-S2 Chest and lung exam: Normal work of breathing, has some mild wheezing mostly at left side, has some decreased breath sound Abdomen: Is soft and nontender to palpation, BS are present Extremities: Pulses are present bilaterally, no lower extremity edema  Assessment/Plan:  Principal Problem:   COPD exacerbation (HCC) Active Problems:   Type 2 diabetes mellitus (HCC)   Tobacco abuse   GAD (generalized anxiety disorder)   Hypertension, benign essential, goal below 140/90   Hypokalemia  68 year old woman with history of COPD, DM, and HTN admitted with recurrent versus persistent COPD exacerbation. She had recent admission due to COPD exacerbation when found to be + for Influenza A. She reports she did not improve back to baseline after discharge.  Acute COPD exacerbation: Improved with home inhalers and IV Ceftriaxone and PO Prednison.  RVP negative COVID-19 negative.  No evidence of bacterial PNA but will continue Ceftraixin. She asks for Nebulizer. It initially held due to pending COVID-19 test.  She has nasal canula and saturating 98%.  The goal is O2sat around 90% and not more. -Keep O2 sat between 89-92% -Continue prednisone 40 mg daily -Continue ceftriaxone 1 g IV daily -Continue albuterol and ipratropium inhalers -Add Duoneb nebulizer q6h   Rib pain:  Chronic -Add Oxycodone-acetaminophen 5-10 mg q6h PRN  Hypokalemia: Repleted and K normal at 4 today Likely due to Albuterol use. Repleting  HTN - Continue home meds: amlodipine 10mg  qdaily, metoprolol tartrate 25mg  qdaily  DM II: Last a1c 5.7 12/2018 BG at 278 this AM, is on Prednisone 40 mg QD  -Starting SSI -Holding home Metformin during inpatient.  Anxiety:  -Continue home duloxetine 60mg  daily - Ramelteon for sleep  DVT prophylaxis:Lovenox Diet:Carb modified  Bowel:Senokot Code:Full   Dispo: Anticipated discharge 1-2 days  [name], MD 02/20/2019, 1:51 PM Pager: [phone]

## 2019-02-20 NOTE — Plan of Care (Signed)
  Problem: Education: Goal: Knowledge of disease or condition will improve Outcome: Progressing   Problem: Education: Goal: Knowledge of the prescribed therapeutic regimen will improve Outcome: Progressing   

## 2019-02-20 NOTE — Progress Notes (Signed)
  Date: 02/20/2019  Patient name: DUANE TRIAS  Medical record number: [id number]  Date of birth: [birth year]   I have seen and evaluated this patient and I have discussed the plan of care with the house staff. Please see their note for complete details. I concur with their findings with the following additions/corrections:   Improving today, cough does not seem so severe.  She was on supplemental O2 overnight but O2 sats are good this morning and she was able to take it off without difficulty.  We will check her ambulatory pulse ox today to ensure she does not still require oxygen.  She did have a number of request today regarding changes in her medications, including switching inhalers to nebulizers as she feels they are more helpful.  She also would like some pain medicine for the pain in her ribs from excessive coughing.  We will start oxycodone and continue the cough syrup.  She also expressed desire to return to our clinic for further care, and I will reach out to our clinic manager.  I am hopeful she will be able to go home tomorrow.  Lenice Pressman, M.D., Ph.D. 02/20/2019, 4:07 PM

## 2019-02-21 LAB — BASIC METABOLIC PANEL
Anion gap: 13 (ref 5–15)
BUN: 7 mg/dL — ABNORMAL LOW (ref 8–23)
CO2: 27 mmol/L (ref 22–32)
Calcium: 8.5 mg/dL — ABNORMAL LOW (ref 8.9–10.3)
Chloride: 97 mmol/L — ABNORMAL LOW (ref 98–111)
Creatinine, Ser: 0.7 mg/dL (ref 0.44–1.00)
GFR calc Af Amer: >60 mL/min (ref >60)
GFR calc non Af Amer: >60 mL/min (ref >60)
Glucose, Bld: 155 mg/dL — ABNORMAL HIGH (ref 70–99)
Potassium: 3.6 mmol/L (ref 3.5–5.1)
Sodium: 137 mmol/L (ref 135–145)

## 2019-02-21 LAB — GLUCOSE, CAPILLARY
Glucose-Capillary: 108 mg/dL — ABNORMAL HIGH (ref 70–99)
Glucose-Capillary: 119 mg/dL — ABNORMAL HIGH (ref 70–99)
Glucose-Capillary: 227 mg/dL — ABNORMAL HIGH (ref 70–99)

## 2019-02-21 MED ORDER — SODIUM CHLORIDE 0.9 % IV SOLN: 1.0000 g | INTRAVENOUS | Status: DC

## 2019-02-21 MED ORDER — SODIUM CHLORIDE 0.9 % IV SOLN
INTRAVENOUS | Status: DC | PRN
  Administered 2019-02-21: 250 mL via INTRAVENOUS

## 2019-02-21 MED ORDER — CEFDINIR 300 MG PO CAPS: 300.0000 mg | ORAL_CAPSULE | Freq: Two times a day (BID) | ORAL | 0 refills | Status: DC

## 2019-02-21 MED ORDER — SODIUM CHLORIDE 0.9 % IV SOLN
1.0000 g | INTRAVENOUS | Status: AC
  Administered 2019-02-21: 1 g via INTRAVENOUS
  Filled 2019-02-21: qty 10

## 2019-02-21 MED ORDER — CEFDINIR 300 MG PO CAPS
300.0000 mg | ORAL_CAPSULE | Freq: Two times a day (BID) | ORAL | Status: DC
  Filled 2019-02-21: qty 1

## 2019-02-21 MED ORDER — CEFDINIR 300 MG PO CAPS: 300.0000 mg | ORAL_CAPSULE | Freq: Two times a day (BID) | ORAL | Status: DC

## 2019-02-21 MED ORDER — PREDNISONE 10 MG (21) PO TBPK: ORAL_TABLET | ORAL | 0 refills | Status: DC

## 2019-02-21 NOTE — Discharge Summary (Addendum)
Name: [name] MRN: [id number] DOB: Mar 18, 1951 68 y.o. PCP: Patient, No Pcp Per  Date of Admission: 02/18/2019  2:18 PM Date of Discharge: 02/21/2019 Attending Physician: Lenice Pressman, MD, PhD  Discharge Diagnosis: 1. Principal Problem:   COPD exacerbation ([place]) Active Problems:   Type 2 diabetes mellitus (HCC)   Tobacco abuse   GAD (generalized anxiety disorder)   Hypertension, benign essential, goal below 140/90   Hypokalemia    Discharge Medications: Allergies as of 02/21/2019      Reactions   Ace Inhibitors Swelling   Throat swelling. Lisinopril-HCTZ   Flonase [fluticasone] Other (See Comments)   Sinuses stop up and condition worsens      Medication List    STOP taking these medications   fluticasone furoate-vilanterol 100-25 MCG/INH Aepb Commonly known as:  BREO ELLIPTA   hydrOXYzine 10 MG tablet Commonly known as:  ATARAX/VISTARIL     TAKE these medications   Accu-Chek Nano SmartView w/Device Kit 1 each by Other route See admin instructions. Check blood sugar up to 4 times daily as needed for high blood sugar. Code: E-11.9   albuterol 108 (90 Base) MCG/ACT inhaler Commonly known as:  VENTOLIN HFA Inhale 2 puffs into the lungs every 6 (six) hours as needed for wheezing or shortness of breath.   amLODipine 10 MG tablet Commonly known as:  NORVASC TAKE 1 TABLET BY MOUTH DAILY   cefdinir 300 MG capsule Commonly known as:  OMNICEF Take 1 capsule (300 mg total) by mouth every 12 (twelve) hours.   DULoxetine 60 MG capsule Commonly known as:  CYMBALTA Take 1 capsule (60 mg total) by mouth daily.   glucose blood test strip Commonly known as:  Accu-Chek SmartView Use to check blood sugar 1 to 2 times daily. diag code E 11.9. Non- insulin dependent   hydrochlorothiazide 25 MG tablet Commonly known as:  HYDRODIURIL Take 25 mg by mouth daily.   ipratropium-albuterol 0.5-2.5 (3) MG/3ML Soln Commonly known as:  DUONEB Take 3 mLs by  nebulization every 6 (six) hours as needed.   LORazepam 0.5 MG tablet Commonly known as:  ATIVAN Take 1 tablet (0.5 mg total) by mouth 2 (two) times daily as needed for anxiety. What changed:  when to take this   losartan 100 MG tablet Commonly known as:  COZAAR Take 100 mg by mouth daily.   metFORMIN 1000 MG tablet Commonly known as:  GLUCOPHAGE Take 1 tablet (1,000 mg total) by mouth 2 (two) times daily.   metoprolol tartrate 25 MG tablet Commonly known as:  LOPRESSOR Take 1 tablet (25 mg total) by mouth 2 (two) times daily.   predniSONE 10 MG (21) Tbpk tablet Commonly known as:  STERAPRED UNI-PAK 21 TAB 40 mg (4 tablets) daily for 2 days.  Then, 30 mg (3 tablets) daily for 4 days. Then, 20 mg (2 tablets) daily for 4 days. Then 10 mg (1 tablet) daily for 4 days.   simvastatin 40 MG tablet Commonly known as:  ZOCOR Take 1 tablet (40 mg total) by mouth daily at 6 PM.       Disposition and follow-up:   [name] was discharged from [facility] in Stable condition.  At the hospital follow up visit please address:  1.  Patient presented with recurrent/persistant COPD exacerbation. We discharged her with PO Cefdinir and PO Prednisone. Please reevaluate her symptoms on follow up.   2. Please check BMP and CBG while on Prednisone.  3.  Labs /  imaging needed at time of follow-up: BMP  3.  Pending labs/ test needing follow-up: None  Follow-up Appointments: Follow-up [place] On 02/28/2019.   Why:  tele health apt at 10:30  Contact information: [place] [phone]          Hospital Course by problem list: 1. COPD exacerbation. 68 year old woman with PMHx of COPD, DM II, and HTN admitted for recurrent versus persistent COPD exacerbation.She had a recent admission (last month) for COPD exacerbation due to Influenza A and was discharged with a  prednisone taper, but never returned to normal. She had recent worsening of her productive cough and shortness of breath with cold sweats, nasal congestion and came to ED for further evaluation. She denied any recent travel or possible COVID-19 exposure.  On arrival, she was tachycardic and tachypneic with wheezing, saturating at 89%. She had no fever and no leucocytosis. Lactic acid was elevated but then normalized when repeated. CXR without consolidation or evidence of pneumonia. She received albuterol and solumedrol treatment at ED. She also was started on IV Ceftriaxone and Prednisone 40 mg QD and received albuterol and Ipratropium. Viral respiratory panel and SARS-CoV2 test came back negative. Her symptoms improved with treatment. She maintained O2 sat well with ambulation at room air. She is discharged home with PO Cefdinir (total 5 day course of antibiotic) and PO prednisone (tapering over 2 weeks.)  Hypokalemia: Patient had K 3.0 when admitted, likely 2/2 albuterol use. K replaced and was 3.6 at discharge.   DM II: She is on Metformin 1000 mg BID at home that was held in the hospital. She had some elevated BG between 200-270 when on Prednisone in hospital that was controlled with SSI. She should resume metformin at discharge.  Discharge Vitals:   BP (!) 148/74 (BP Location: Left Arm)   Pulse 86   Temp 98.5 F (36.9 C) (Oral)   Resp 18   Ht _0  (1.676 m)   Wt 89.5 kg   SpO2 98%   BMI 31.85 kg/m   Pertinent Labs, Studies, and Procedures:      Ref Range & Units 5d ago  SARS Coronavirus 2 NEGATIVE NEGATIVE        BC: Negative CBG (last 3)  BMP Latest Ref Rng & Units 02/21/2019 02/20/2019 02/19/2019  Glucose 70 - 99 mg/dL 155(H) 147(H) 278(H)  BUN 8 - 23 mg/dL 7(L) 10 15  Creatinine 0.44 - 1.00 mg/dL 0.70 0.88 0.90  BUN/Creat Ratio 12 - 28 - - -  Sodium 135 - 145 mmol/L 137 138 133(L)  Potassium 3.5 - 5.1 mmol/L 3.6 4.0 4.8  Chloride 98 - 111 mmol/L 97(L) 99 95(L)  CO2 22 -  32 mmol/L _1 Calcium 8.9 - 10.3 mg/dL 8.5(L) 8.8(L) 8.9   CXR: 02/18/2019  IMPRESSION: No acute cardiopulmonary abnormality seen. Hyperexpansion lung seen suggesting chronic obstructive pulmonary disease.  3d ago (02/20/19) 5d ago (02/18/19) 67moago (01/15/19) 134mogo (01/14/19)    Magnesium 1.7 - 2.4 mg/dL 1.8  1.5Low  CM 2.0 CM 1.5Low  CM      Ref Range & Units 5d ago  Adenovirus NOT DETECTED NOT DETECTED   Coronavirus 229E NOT DETECTED NOT DETECTED   Comment: (NOTE)  The Coronavirus on the Respiratory Panel, DOES NOT test for the novel  Coronavirus (2019 nCoV)   Coronavirus HKU1 NOT DETECTED NOT DETECTED   Coronavirus NL63 NOT DETECTED NOT  DETECTED   Coronavirus OC43 NOT DETECTED NOT DETECTED   Metapneumovirus NOT DETECTED NOT DETECTED   Rhinovirus / Enterovirus NOT DETECTED NOT DETECTED   Influenza A NOT DETECTED NOT DETECTED   Influenza B NOT DETECTED NOT DETECTED   Parainfluenza Virus 1 NOT DETECTED NOT DETECTED   Parainfluenza Virus 2 NOT DETECTED NOT DETECTED   Parainfluenza Virus 3 NOT DETECTED NOT DETECTED   Parainfluenza Virus 4 NOT DETECTED NOT DETECTED   Respiratory Syncytial Virus NOT DETECTED NOT DETECTED   Bordetella pertussis NOT DETECTED NOT DETECTED   Chlamydophila pneumoniae NOT DETECTED NOT DETECTED   Mycoplasma pneumoniae NOT DETECTED NOT DETECTED         (02/18/19)     Lactic Acid, Venous 0.5 - 1.9 mmol/L 1.5  2.2High Panic  CM    Discharge Instructions: Discharge Instructions    Call MD for:   Complete by:  As directed    Shortness of breath, sever cough, fever   Call MD for:   Complete by:  As directed    Sever cough, shortness of breath   Call MD for:  temperature >100.4   Complete by:  As directed    Call MD for:  temperature >100.4   Complete by:  As directed    Diet - low sodium heart healthy   Complete by:  As directed    Diet - low sodium heart healthy   Complete by:  As directed    Discharge instructions   Complete by:  As  directed    Thank you for allowing [place] taking care of you at [facility]. We are glad that you are feeling better now. Please get your prescription from your pharmacy and follow the instruction for takingantibiotic and Prednisone. Please take rest of your medications as before and follow up with your primary doctor. Please call us at [phone] if you have any question or concern.   Thank you   Discharge instructions   Complete by:  As directed    Thank you for allowing [place] taking care of you at [facility]. We are glad that you are feeling better now. Please get your prescription from your pharmacy and follow the instruction for takingantibiotic and Prednisone. Please take rest of your medications as before and follow up with your primary doctor. Please call us at [phone] if you have any question or concern.   Thank you   Increase activity slowly   Complete by:  As directed    Increase activity slowly   Complete by:  As directed       Signed: [name], MD 02/24/2019, 3:17 PM   Pager: [phone]    Internal Medicine Attending Note:  I saw and examined the patient on the day of discharge. I reviewed and agree with the discharge summary written by the house staff.  Lenice Pressman, M.D., Ph.D.

## 2019-02-21 NOTE — Discharge Instructions (Signed)
Thank you for allowing [place] taking care of you at [facility]. We are glad that you are feeling better now. Please get your prescription from your pharmacy and follow the instruction for takingantibiotic and Prednisone. Please take rest of your medications as before and follow up with your primary doctor. Please call us at [phone] if you have any question or concern.   Thank you

## 2019-02-21 NOTE — Progress Notes (Signed)
   Subjective: Patient was seen and evaluated at bedside on morning rounds. No acute events overnight. No acute complaints. Her rib pain is better with pain medicine and her breathing is better  With breathing treatment.   Objective:  Vital signs in last 24 hours: Vitals:   02/20/19 2024 02/20/19 2350 02/21/19 0121 02/21/19 0504  BP: (!) 166/73 (!) 148/76  (!) 162/80  Pulse: 86 71  79  Resp: 18 18  18   Temp: 97.8 F (36.6 C) 97.7 F (36.5 C)  98.2 F (36.8 C)  TempSrc: Oral Oral  Oral  SpO2: 99% 97% 98% 98%  Weight:    89.5 kg  Height:       Ph/E: General: Sitting in the bed in no acute distress HEENT:  Eyes: EOM nl CV: RRR, normal S1-S2 Chest and lung exam: Normal work of breathing, has diffuse wheezing  Abdomen: Is soft and nontender to palpation, BS are present Extremities: Pulses are present bilaterally  Assessment/Plan:  Principal Problem:   COPD exacerbation (HCC) Active Problems:   Type 2 diabetes mellitus (HCC)   Tobacco abuse   GAD (generalized anxiety disorder)   Hypertension, benign essential, goal below 140/90   Hypokalemia   68 year old woman with history of COPD, DM, and HTN admitted with recurrent versus persistent COPD exacerbation. She had recent admission due to COPD exacerbation when found to be + for Influenza A. She reports she did not improve back to baseline after discharge.  Acute COPD exacerbation: Improved with home inhalers and IV Ceftriaxone and PO Prednison.  RVP negative COVID-19 negative.  No evidence of bacterial PNA but will continue Ceftraixin. Ordered Duoneb Nebulizer yesterday per her request. (It initially held due to pending COVID-19 test.) She mentions that she feels better today after using nebulizer. She is saturating 98% at room air. No more cough during the encounter  Ambulatory pulse oxymetry performed yesterday and was nl w/o supplemental O2. If ambulates well in the hall today, may discharge to home. Will finish AB  course and will taper Prednisone over 2 weeks considering recent reoccur ance of exacerbation.  -O2 sat goal > 89 -Continue prednisone 40 mg daily now and after discharge and taper over 2 weeks (Started 02/18/2019) -Continue ceftriaxone 1g IV daily when in hospital. Started 02/18/2019,  Will switch to PO Cefdinir at discharge (End date 02/23/2019) -Continue albuterol and ipratropium inhalers -Continue Duoneb nebulizer q6h  Rib pain:  Likely 2/2 to cough. Improved.  She received 1 dose of Oxy-acetaminophen yesterday -Continue Oxycodone-acetaminophen 5-10 mg q6h PRN  Hypokalemia:Likely due to Albuterol use. Repleted and K 3.6 today  HTN  - Continue amlodipine 10mg  qdaily, metoprolol tartrate 25mg  qdaily  DM II: Last a1c 5.7 12/2018 BG at 108  this AM, is on Prednisone 40 mg QD  -Continue SSI -Holding home Metformin during inpatient.  Anxiety:  -Continue home duloxetine 60mg  daily - Ramelteon for sleep  DVT prophylaxis:Lovenox Diet:Carb modified  Bowel:Senokot Code:Full  Dispo: Anticipated discharge today or tomorrow   [name], MD 02/21/2019, 6:39 AM Pager: [phone]

## 2019-02-21 NOTE — Care Management Important Message (Signed)
Important Message  Patient Details  Name: [name] MRN: [id number] Date of Birth: [birth year]   Medicare Important Message Given:  Yes    Abhiram Criado 02/21/2019, 2:25 PM

## 2019-02-21 NOTE — Progress Notes (Signed)
  Date: 02/21/2019  Patient name: [name]  Medical record number: [id number]  Date of birth: 08/20/1951   I have seen and evaluated this patient and I have discussed the plan of care with the house staff. Please see their note for complete details. I concur with their findings with the following additions/corrections:   Improving today, off of oxygen and was able to walk the floors yesterday with a little bit of shortness of breath but much improved.  Rib pain responded well to oxycodone, but she reports she only got 1 dose.  She also says she was waiting for a nebulizer this morning.  I am hopeful we will be able to get her home today to complete her 5-day course of antibiotics and a longer taper of prednisone.  Lenice Pressman, M.D., Ph.D. 02/21/2019, 1:55 PM

## 2019-02-21 NOTE — Progress Notes (Signed)
Patient down for cardioversion.

## 2019-02-21 NOTE — Progress Notes (Signed)
Per Md patient to receive one dose of rocephin IVPB tonight then be discharged tonight.

## 2019-02-21 NOTE — Plan of Care (Signed)
  Problem: Education: Goal: Knowledge of disease or condition will improve 02/21/2019 1333 by Tristan Schroeder, RN Outcome: Adequate for Discharge 02/21/2019 1333 by Tristan Schroeder, RN Outcome: Progressing   Problem: Education: Goal: Knowledge of the prescribed therapeutic regimen will improve 02/21/2019 1333 by Tristan Schroeder, RN Outcome: Adequate for Discharge 02/21/2019 1333 by Tristan Schroeder, RN Outcome: Progressing

## 2019-02-21 NOTE — Progress Notes (Signed)
Inpatient Diabetes Program Recommendations  AACE/ADA: New Consensus Statement on Inpatient Glycemic Control  Target Ranges:  Prepandial:   less than 140 mg/dL      Peak postprandial:   less than 180 mg/dL (1-2 hours)      Critically ill patients:  140 - 180 mg/dL   Results for [name], [name] (MRN [id number]) as of 02/21/2019 08:46  Ref. Range 02/20/2019 05:15 02/20/2019 12:07 02/20/2019 15:54 02/20/2019 21:22 02/21/2019 06:10  Glucose-Capillary Latest Ref Range: 70 - 99 mg/dL 204 (H) 236 (H) 210 (H) 267 (H) 108 (H)   Review of Glycemic Control  Diabetes history: DM2 Outpatient Diabetes medications: Metformin 1000 mg BID Current orders for Inpatient glycemic control: Novolog 0-15 units TID with meals, Novolog 0-5 units QHS: Prednisone 40 mg QAM   Inpatient Diabetes Program Recommendations:   Insulin-Meal Coverage: If Prednisone is continued, please consider ordering Novolog 3 units TID with meals for meal coverage if patient eats at least 50% of meals.  Thanks, Barnie Alderman, RN, MSN, CDE Diabetes Coordinator Inpatient Diabetes Program [phone] (Team Pager from 8am to 5pm)

## 2019-02-21 NOTE — Progress Notes (Signed)
Discharge instructions given to patient she verbalized understanding of instructions given. Patient reported her husband would be here later tonight after he got off work at 8 pm to pick her up for discharge purposes.

## 2019-02-22 ENCOUNTER — Other Ambulatory Visit: Payer: Self-pay

## 2019-02-22 ENCOUNTER — Other Ambulatory Visit: Payer: Self-pay | Admitting: Internal Medicine

## 2019-02-22 DIAGNOSIS — J441 Chronic obstructive pulmonary disease with (acute) exacerbation: Secondary | ICD-10-CM

## 2019-02-22 NOTE — Telephone Encounter (Signed)
I can not refill her lorazepam. Please route to someone else. DrG

## 2019-02-22 NOTE — Telephone Encounter (Signed)
Based on review of chart, Lorazepam was not meant to be a long term med.  Make appointment with PCP virtual to discuss.

## 2019-02-23 LAB — CULTURE, BLOOD (ROUTINE X 2)
Culture: NO GROWTH
Culture: NO GROWTH
Special Requests: ADEQUATE
Special Requests: ADEQUATE

## 2019-02-26 ENCOUNTER — Other Ambulatory Visit: Payer: Self-pay

## 2019-02-26 NOTE — Patient Outreach (Signed)
[facility]) Care Management  02/26/2019  [name] Jul 05, 1951 [id number]   EMMI- COPD RED ON EMMI ALERT Day # 3 Date: 02/25/2019 Red Alert Reason:  # of times rescue inhaler used in past 24 hours? 2  Questions/problems with meds? Yes  Smoked or been around smoke? Yes     Outreach attempt: spoke with patient. She is able to verify HIPAA. She states that she is feeling better.  She states that she is still on her steroids and antibiotics. Discussed COPD exacerbation and when to seek medical attention.  She verbalized understanding.  She states that she has a visit with the clinic on Wednesday.  Patient states that she was dismissed from the internal medicine clinic.  Discussed her appointment with [facility] on Wednesday.  Patient states she has not had to use her rescue inhaler but her scheduled inhalers as ordered.  Patient does not have questions or concerns about medications at this time but states she gets confused with medications changes.  She says she is following her discharge list of medications.  Patient still smoking and is trying to slowly quit.  Advised with her breathing would improve without smoking.  She verbalized understanding and states she will continue to try to quit and offers no needs with that at this time.  Advised patient that she would continue to receive automated calls and if there is a questionable answer that it would trigger for a nurse to call. She verbalized understanding and is appreciative of call.  Patient declines any further needs at this time.     Plan: RN CM will close case.   [name], RN, MSN [facility] Care Management Care Management Coordinator Direct Line [phone] Toll Free: [phone]  Fax: [phone]

## 2019-02-27 ENCOUNTER — Telehealth: Payer: Self-pay | Admitting: Pharmacist

## 2019-02-27 NOTE — Telephone Encounter (Signed)
-----   Message from Meriam Sprague sent at 02/27/2019  2:59 PM EDT ----- Patient would like you to give her call.  Thanks,  [employer]

## 2019-02-28 ENCOUNTER — Encounter: Payer: Self-pay | Admitting: Critical Care Medicine

## 2019-02-28 ENCOUNTER — Ambulatory Visit (INDEPENDENT_AMBULATORY_CARE_PROVIDER_SITE_OTHER): Payer: Medicare Other | Admitting: Internal Medicine

## 2019-02-28 ENCOUNTER — Other Ambulatory Visit: Payer: Self-pay

## 2019-02-28 ENCOUNTER — Ambulatory Visit: Payer: Medicare Other | Attending: Critical Care Medicine | Admitting: Critical Care Medicine

## 2019-02-28 DIAGNOSIS — F411 Generalized anxiety disorder: Secondary | ICD-10-CM | POA: Diagnosis not present

## 2019-02-28 DIAGNOSIS — J9801 Acute bronchospasm: Secondary | ICD-10-CM

## 2019-02-28 DIAGNOSIS — F17210 Nicotine dependence, cigarettes, uncomplicated: Secondary | ICD-10-CM

## 2019-02-28 DIAGNOSIS — I10 Essential (primary) hypertension: Secondary | ICD-10-CM

## 2019-02-28 DIAGNOSIS — Z720 Tobacco use: Secondary | ICD-10-CM

## 2019-02-28 DIAGNOSIS — J449 Chronic obstructive pulmonary disease, unspecified: Secondary | ICD-10-CM

## 2019-02-28 DIAGNOSIS — E119 Type 2 diabetes mellitus without complications: Secondary | ICD-10-CM | POA: Diagnosis not present

## 2019-02-28 MED ORDER — LOSARTAN POTASSIUM 100 MG PO TABS: 100.0000 mg | ORAL_TABLET | Freq: Every day | ORAL | 3 refills | Status: DC

## 2019-02-28 MED ORDER — BUDESONIDE-FORMOTEROL FUMARATE 160-4.5 MCG/ACT IN AERO: 2.0000 | INHALATION_SPRAY | Freq: Two times a day (BID) | RESPIRATORY_TRACT | 12 refills | Status: DC

## 2019-02-28 NOTE — Assessment & Plan Note (Signed)
>>  ASSESSMENT AND PLAN FOR CHRONIC OBSTRUCTIVE PULMONARY DISEASE WITH BRONCHOSPASM ([place]) WRITTEN ON 02/28/2019 12:52 PM BY HELBERG, JUSTIN, MD  HPI: Patient recently admitted from 4/12-4/15 for an acute COPD exacerbation. She was managed with PO prednisone and antibiotics. Since discharge she has been doing well and her respiratory status is back to baseline. She has finished her antibiotic therapy and is still completing her steroid course. She does not have a consistent maintenance inhaler regimen because of cost. Over the past 12 months she has been hospitalized 4-5 times.   A/P: - On review of records she has Gold Stage II COPD and is high risk. She needs to be on LAMA/LABA therapy. She does not have Medicare part D. I have reached out to Dr. Maudie Mercury to see if there is medication assistance for the patient and if she has any suggestions for cheaper inhalers.  - Patient is continuing to work on smoking cessation

## 2019-02-28 NOTE — Progress Notes (Signed)
Patient ID: [name], female   DOB: [birth year], 68 y.o.   MRN: [id number]  Virtual Visit via Phone Note  I connected with@ on 02/28/19 at@ by a phone  telemedicine application and verified that I am speaking with the correct person using two identifiers.   Consent:  I discussed the limitations, risks, security and privacy concerns of performing an evaluation and management service by phone visit and the availability of in person appointments. I also discussed with the patient that there may be a patient responsible charge related to this service. The patient expressed understanding and agreed to proceed.  Location of patient: Pt at home  Location of provider: in office   Persons participating in the televisit with the patient.   Pt by her self   History of Present Illness: This is a 68 year old female with history of chronic obstructive lung disease recently hospitalized from the 12th and 15 April for a COPD exacerbation.  The patient was discharged on oral prednisone and Ceftin ear and has finished these medications.  The patient is seen by way of a telephone visit.  Patient states overall her shortness of breath is somewhat improved.  She is anxious at times and can become panicky at times as well.  She also has hypertension and when questioned she did not get the losartan feel that is and is only taking the amlodipine and metoprolol and is not taking losartan.  The patient used to be on Breo inhalers in the past but now is only on the albuterol alone. The patient is followed in the internal medicine service clinic and at 1 point was told she could not return because she missed 3 appointments but apparently now will reestablish there for primary care.  The patient in addition to hypertension has diabetes however her blood sugars had improved as the prednisone was tapered. Below are excerpts from the patient's discharge summary Date of Admission: 02/18/2019  2:18 PM Date of Discharge:  02/21/2019 Attending Physician: Christie Pressman, MD, PhD  Discharge Diagnosis: 1. Principal Problem:   COPD exacerbation ([place]) Active Problems:   Type 2 diabetes mellitus (HCC)   Tobacco abuse   GAD (generalized anxiety disorder)   Hypertension, benign essential, goal below 140/90   Hypokalemia   Disposition and follow-up:   [name] was discharged from [facility] in Stable condition.  At the hospital follow up visit please address:  1.  Patient presented with recurrent/persistant COPD exacerbation. We discharged her with PO Cefdinir and PO Prednisone. Please reevaluate her symptoms on follow up.   2. Please check BMP and CBG while on Prednisone.  3.  Labs / imaging needed at time of follow-up: BMP  3.  Pending labs/ test needing follow-up: None      Hospital Course by problem list: 1. COPD exacerbation. 68 year old woman with PMHx of COPD, DM II, and HTN admitted for recurrent versus persistent COPD exacerbation.She had a recent admission (last month) for COPD exacerbation due to Influenza A and was discharged with a prednisone taper, but never returned to normal. She had recent worsening of her productive cough and shortness of breath with cold sweats, nasal congestion and came to ED for further evaluation. She denied any recent travel or possible COVID-19 exposure.  On arrival, she was tachycardic and tachypneic with wheezing, saturating at 89%. She had no fever and no leucocytosis. Lactic acid was elevated but then normalized when repeated. CXR without consolidation or evidence of pneumonia. She received albuteroland solumedrol  treatment at ED. She also was started on IV Ceftriaxone and Prednisone 40 mg QD and received albuterol and Ipratropium. Viral respiratory panel and SARS-CoV2 test came back negative. Her symptoms improved with treatment. She maintained O2 sat well with ambulation at room air. She is discharged home with PO  Cefdinir (total 5 day course of antibiotic) and PO prednisone (tapering over 2 weeks.)  Hypokalemia: Patient had K 3.0 when admitted, likely 2/2 albuterol use. K replaced and was 3.6 at discharge.   DM II: She is on Metformin 1000 mg BID at home that was held in the hospital. She had some elevated BG between 200-270 when on Prednisone in hospital that was controlled with SSI. She should resume metformin at discharge.  The patient does state she is not checking her blood glucoses as she should at this time.  She does not note she had seen [name] several years ago.  She had a pulmonary function in 2016 which showed an FEV1 of 68% predicted diffusion capacity of 69% predicted.  Pos in BOLD Constitutional:   No  weight loss, night sweats,  Fevers, chills, fatigue, lassitude. HEENT:   No headaches,  Difficulty swallowing,  Tooth/dental problems,  Sore throat,                No sneezing, itching, ear ache, nasal congestion, post nasal drip,   CV:  No chest pain,  Orthopnea, PND, swelling in lower extremities, anasarca, dizziness, palpitations  GI  No heartburn, indigestion, abdominal pain, nausea, vomiting, diarrhea, change in bowel habits, loss of appetite  Resp: shortness of breath with exertion or at rest.  No excess mucus, no productive cough,   non-productive cough,  No coughing up of blood.  No change in color of mucus.  No wheezing.  No chest wall deformity  Skin: no rash or lesions.  GU: no dysuria, change in color of urine, no urgency or frequency.  No flank pain.  MS:  No joint pain or swelling.  No decreased range of motion.  No back pain.  Psych:  No change in mood or affect. No depressionr anxiety.  No memory loss.    Observations/Objective: No observations this was a telephone visit  Assessment and Plan: COPD with emphysematous component  Plan for this will be to prescribe Symbicort 162 inhalations twice daily and have this mailed to the patient's home we will also  refill the patient's losartan 100 mg daily and have this mailed as well.  Hypertension     we will refill the losartan and have the patient continue the hydrochlorothiazide metoprolol and amlodipine  Diabetes type 2 have asked the patient to continue to recheck her blood glucoses and maintain her metformin as prescribed she is now off the prednisone  Follow Up Instructions: I have asked for this patient to be seen in the office in the next 2 to 3 weeks for an in office visit   I discussed the assessment and treatment plan with the patient. The patient was provided an opportunity to ask questions and all were answered. The patient agreed with the plan and demonstrated an understanding of the instructions.   The patient was advised to call back or seek an in-person evaluation if the symptoms worsen or if the condition fails to improve as anticipated.  I provided 6minutes of non-face-to-face time during this encounter  including  median intraservice time , review of notes, labs, imaging, medications  and explaining diagnosis and management to the patient .  Asencion Noble, MD

## 2019-02-28 NOTE — Progress Notes (Signed)
Medicine attending: Medical history, presenting problems,  and medications, reviewed with resident physician Dr Justin Helberg on the day of the patient telephone consultation and I concur with his evaluation and management plan. 

## 2019-02-28 NOTE — Assessment & Plan Note (Addendum)
HPI: Patient recently admitted from 4/12-4/15 for an acute COPD exacerbation. She was managed with PO prednisone and antibiotics. Since discharge she has been doing well and her respiratory status is back to baseline. She has finished her antibiotic therapy and is still completing her steroid course. She does not have a consistent maintenance inhaler regimen because of cost. Over the past 12 months she has been hospitalized 4-5 times.   A/P: - On review of records she has Gold Stage II COPD and is high risk. She needs to be on LAMA/LABA therapy. She does not have Medicare part D. I have reached out to Dr. Maudie Mercury to see if there is medication assistance for the patient and if she has any suggestions for cheaper inhalers.  - Patient is continuing to work on smoking cessation

## 2019-02-28 NOTE — Progress Notes (Signed)
   CC: COPD  This is a telephone encounter between [name] and [employer] on 02/28/2019 for COPD. The visit was conducted with the patient located at home and [facility] at [facility]. The patient's identity was confirmed using their DOB and current address. The patient has consented to being evaluated through a telephone encounter and understands the associated risks (an examination cannot be done and the patient may need to come in for an appointment) / benefits (allows the patient to remain at home, decreasing exposure to coronavirus). I personally spent 15 minutes on medical discussion.   HPI:  [name] is a 68 y.o. with PMH as below.   Please see A&P for assessment of the patient's acute and chronic medical conditions.   Past Medical History:  Diagnosis Date  . Anxiety   . Arthritis    "fingers, knees" (08/16/2018)  . Asthma   . Cancer of right breast ([place]) 1991   s/p lumpectomy, chemotherapy and radiation therapy in 1991. Mammogram in 2007 was normal.  . Constipated    h/o  . COPD (chronic obstructive pulmonary disease) ([place])    History of multiple hospital admissions for exercabation   . COPD with exacerbation ([place]) 04/06/2009   Qualifier: Diagnosis of  By: Eyvonne Mechanic MD, Vijay    . Depression   . Diarrhea    h/o  . GERD (gastroesophageal reflux disease)   . Headache    "a few times/month" (08/16/2018  . Heart murmur 10/05/11   "first time I ever heard I had one was today"  . Hyperlipidemia   . Hypertension   . Obesity   . Personal history of chemotherapy   . Personal history of radiation therapy   . Pneumonia    "couple times in the last 10-15 yrs" (08/16/2018)  . Shortness of breath 10/05/11   "at rest; lying down; w/exertion"  . Sigmoid diverticulitis 80/2008  . Tobacco abuse   . Type II diabetes mellitus ([place])    Review of Systems:  Performed and all others negative.  Assessment & Plan:   See Encounters Tab for problem based charting.   Patient discussed with Dr. Beryle Beams

## 2019-02-28 NOTE — Progress Notes (Signed)
Called patient to offer assistance with medication access. I notified patient that she would need to provide income information in order to apply for patient assistance programs. I have notified patient of this information over the past 5 years and she has not followed through. I educated patient on importance of following through for successful long-term medication support rather than continuing with short-term intermittent support which can increase risk of hospital readmissions.  I also inquired whether patient has Medicare part D to cover medication expenses and she states she does not know. I informed her of the importance of also clarifying whether she has Medicare D, since this will influence the programs she may qualify for. I advised patient a scheduled appointment would be best in order to complete applications and provide medication samples/immediate access. Patient verbalized understanding by repeat back and states she will fax me her income and insurance information and try to schedule an appointment with me. I will try to assist patient remotely in the meantime.

## 2019-03-05 ENCOUNTER — Other Ambulatory Visit: Payer: Self-pay

## 2019-03-05 NOTE — Patient Outreach (Signed)
[facility]) Care Management  03/05/2019  [name] 1951/01/29 [id number]   EMMI- COPD RED ON EMMI ALERT Day # 8 Date:  03/02/2019 Red Alert Reason:  # of times rescue inhaler used in past 24 hours? 1  Questions/problems with meds? Yes    Outreach attempt: No answer.  HIPAA compliant voice message left.  Plan: RN CM will attempt again within 4 business days and send a letter.    [name], RN, MSN [facility] Care Management Care Management Coordinator Direct Line [phone] Toll Free: [phone]  Fax: [phone]

## 2019-03-06 ENCOUNTER — Other Ambulatory Visit: Payer: Self-pay

## 2019-03-06 NOTE — Patient Outreach (Signed)
[facility]) Care Management  03/06/2019  [name] 02-07-1951 [id number]   EMMI- COPD RED ON EMMI ALERT Day # 8 Date: 03/02/2019 Red Alert Reason: # of times rescue inhaler used in past 24 hours? 1  Questions/problems with meds? Yes    Outreach attempt: No answer.  HIPAA compliant voice message left.  Plan: RN CM will attempt again within 4 business days.  [name], RN, MSN [place] Management Care Management Coordinator Direct Line [phone] Cell [phone] Toll Free: [phone]  Fax: [phone]

## 2019-03-07 ENCOUNTER — Other Ambulatory Visit: Payer: Self-pay

## 2019-03-07 NOTE — Patient Outreach (Addendum)
[facility]) Care Management  03/07/2019  [name] [birth year] [id number]   EMMI-COPD RED ON EMMI ALERT Day #8 Date:03/02/2019 Red Alert Reason: # of times rescue inhaler used in past 24 hours? 1  Questions/problems with meds? Yes    Spoke with patient.  She is able to verify HIPAA.  She states that she is doing fine. Addressed red alerts with patient.  She states she is doing fine and have not had to use her rescue inhaler.  She states she is working with her doctors office for patient assistance with her inhalers as they are so expensive.  Encouraged patient to continue with the process and send in needed information to qualify for patient assistance.  She verbalized understanding and denies any needs at this time.   Plan: RN CM will close case.   [name], RN, MSN [place] Management Care Management Coordinator Direct Line [phone] Cell [phone] Toll Free: [phone]  Fax: [phone]

## 2019-03-09 ENCOUNTER — Other Ambulatory Visit: Payer: Self-pay

## 2019-03-09 NOTE — Patient Outreach (Signed)
[facility]) Care Management  03/09/2019  BEAUTY PLESS 11/28/1950 [id number]   EMMI- COPD RED ON EMMI ALERT Day # 14 Date: 03/08/2019  Red Alert Reason:  Been to follow up appointment? No Scheduled follow up appointment? no  Outreach attempt: No answer.  HIPAA compliant voice message left.   Plan: RN CM will attempt again within 4 business days and send letter.   [name], RN, MSN [facility] Care Management Care Management Coordinator Direct Line [phone] Toll Free: [phone]  Fax: [phone]

## 2019-03-12 ENCOUNTER — Other Ambulatory Visit: Payer: Self-pay

## 2019-03-12 NOTE — Patient Outreach (Signed)
[facility]) Care Management  03/12/2019  [name] Apr 23, 1951 [id number]    EMMI- COPD RED ON EMMI ALERT Day # 14 Date:03/08/2019  Red Alert Reason: Been to follow up appointment? No Scheduled follow up appointment? no  Outreach attempt: No answer.  HIPAA compliant voice message left.   Plan: RN CM will attempt again within 4 business days.  [name], RN, MSN [place] Management Care Management Coordinator Direct Line [phone] Cell [phone] Toll Free: [phone]  Fax: [phone]

## 2019-03-13 ENCOUNTER — Other Ambulatory Visit: Payer: Self-pay

## 2019-03-13 NOTE — Patient Outreach (Signed)
[facility]) Care Management  03/13/2019  [name] Jun 21, 1951 [id number]   EMMI-COPD RED ON EMMI ALERT Day #14 Date:03/08/2019  Red Alert Reason: Been to follow up appointment? No Scheduled follow up appointment? no  Outreach attempt:No answer. HIPAA compliant voice message left.   Plan: RN CM willwait return call. If no return call will close case.   [name], RN, MSN [place] Management Care Management Coordinator Direct Line [phone] Cell [phone] Toll Free: [phone]  Fax: [phone]

## 2019-03-14 ENCOUNTER — Encounter: Payer: Self-pay | Admitting: Critical Care Medicine

## 2019-03-14 ENCOUNTER — Telehealth: Payer: Self-pay

## 2019-03-14 ENCOUNTER — Other Ambulatory Visit: Payer: Self-pay

## 2019-03-14 ENCOUNTER — Ambulatory Visit: Payer: Medicare Other

## 2019-03-14 ENCOUNTER — Ambulatory Visit: Payer: Medicare Other | Attending: Critical Care Medicine | Admitting: Critical Care Medicine

## 2019-03-14 DIAGNOSIS — J441 Chronic obstructive pulmonary disease with (acute) exacerbation: Secondary | ICD-10-CM

## 2019-03-14 DIAGNOSIS — E118 Type 2 diabetes mellitus with unspecified complications: Secondary | ICD-10-CM

## 2019-03-14 DIAGNOSIS — I10 Essential (primary) hypertension: Secondary | ICD-10-CM | POA: Diagnosis not present

## 2019-03-14 DIAGNOSIS — Z1159 Encounter for screening for other viral diseases: Secondary | ICD-10-CM

## 2019-03-14 DIAGNOSIS — F419 Anxiety disorder, unspecified: Secondary | ICD-10-CM

## 2019-03-14 DIAGNOSIS — J329 Chronic sinusitis, unspecified: Secondary | ICD-10-CM | POA: Diagnosis not present

## 2019-03-14 DIAGNOSIS — J449 Chronic obstructive pulmonary disease, unspecified: Secondary | ICD-10-CM

## 2019-03-14 DIAGNOSIS — E119 Type 2 diabetes mellitus without complications: Secondary | ICD-10-CM

## 2019-03-14 MED ORDER — SIMVASTATIN 40 MG PO TABS: 40.0000 mg | ORAL_TABLET | Freq: Every day | ORAL | 1 refills | Status: DC

## 2019-03-14 MED ORDER — GLUCOSE BLOOD VI STRP: ORAL_STRIP | 6 refills | Status: DC

## 2019-03-14 MED ORDER — METOPROLOL TARTRATE 25 MG PO TABS: 25.0000 mg | ORAL_TABLET | Freq: Two times a day (BID) | ORAL | 1 refills | Status: DC

## 2019-03-14 MED ORDER — AMLODIPINE BESYLATE 10 MG PO TABS: 10.0000 mg | ORAL_TABLET | Freq: Every day | ORAL | 1 refills | Status: DC

## 2019-03-14 MED ORDER — HYDROCHLOROTHIAZIDE 25 MG PO TABS: 25.0000 mg | ORAL_TABLET | Freq: Every day | ORAL | 1 refills | Status: DC

## 2019-03-14 MED ORDER — METFORMIN HCL 1000 MG PO TABS: 1000.0000 mg | ORAL_TABLET | Freq: Two times a day (BID) | ORAL | 1 refills | Status: DC

## 2019-03-14 MED ORDER — LOSARTAN POTASSIUM 100 MG PO TABS: 100.0000 mg | ORAL_TABLET | Freq: Every day | ORAL | 1 refills | Status: DC

## 2019-03-14 MED ORDER — DULOXETINE HCL 60 MG PO CPEP: 60.0000 mg | ORAL_CAPSULE | Freq: Every day | ORAL | 2 refills | Status: DC

## 2019-03-14 MED ORDER — ALBUTEROL SULFATE HFA 108 (90 BASE) MCG/ACT IN AERS: 2.0000 | INHALATION_SPRAY | Freq: Four times a day (QID) | RESPIRATORY_TRACT | 2 refills | Status: DC | PRN

## 2019-03-14 MED ORDER — FLUTICASONE-SALMETEROL 250-50 MCG/DOSE IN AEPB: 1.0000 | INHALATION_SPRAY | Freq: Two times a day (BID) | RESPIRATORY_TRACT | 6 refills | Status: DC

## 2019-03-14 MED ORDER — ACCU-CHEK GUIDE W/DEVICE KIT: 1.0000 | PACK | 0 refills | Status: DC

## 2019-03-14 MED ORDER — CETIRIZINE HCL 10 MG PO TABS: 10.0000 mg | ORAL_TABLET | Freq: Every day | ORAL | 11 refills | Status: DC

## 2019-03-14 MED ORDER — ACCU-CHEK NANO SMARTVIEW W/DEVICE KIT: 1.0000 | PACK | 0 refills | Status: DC

## 2019-03-14 MED ORDER — ACCU-CHEK FASTCLIX LANCETS MISC: 6 refills | Status: DC

## 2019-03-14 MED FILL — !ADVAIR 250/50 DISKUS: 250-50 | 90 days supply | Qty: 180 | Fill #0

## 2019-03-14 MED FILL — !VENTOLIN HFA INHALER: 108 (90 BAS | 25 days supply | Qty: 18 | Fill #0

## 2019-03-14 NOTE — Telephone Encounter (Signed)
Alinda Sierras with [facility] pharmacy called stating that the patient was prescribed the Blood Glucose Monitoring Suppl ([place]) however, it is no longer available to be prescribed. Please follow up

## 2019-03-14 NOTE — Progress Notes (Signed)
Patient ID: [name], female   DOB: 1951-07-20, 68 y.o.   MRN: [id number] Virtual Visit via Telephone Note  I connected with [name] on 03/14/19 at  8:30 AM EDT by telephone and verified that I am speaking with the correct person using two identifiers.   Consent:  I discussed the limitations, risks, security and privacy concerns of performing an evaluation and management service by telephone and the availability of in person appointments. I also discussed with the patient that there may be a patient responsible charge related to this service. The patient expressed understanding and agreed to proceed.  Location of patient: The patient was in her car with her husband  Location of provider: I was in my office  Persons participating in the televisit with the patient.  The husband listened then on the call   History of Present Illness: This is a telephone visit with a 68 year old female with chronic obstructive lung disease.  I last connected with this patient telephonically on 22 April.  Her history at that time was as below. This is a 68 year old female with history of chronic obstructive lung disease recently hospitalized from the 12th and 15 April for a COPD exacerbation.  The patient was discharged on oral prednisone and Ceftin ear and has finished these medications.  The patient is seen by way of a telephone visit.  Patient states overall her shortness of breath is somewhat improved.  She is anxious at times and can become panicky at times as well.  She also has hypertension and when questioned she did not get the losartan feel that is and is only taking the amlodipine and metoprolol and is not taking losartan.  The patient used to be on Breo inhalers in the past but now is only on the albuterol alone. The patient is followed in the internal medicine service clinic and at 1 point was told she could not return because she missed 3 appointments but apparently now will reestablish  there for primary care.  The patient in addition to hypertension has diabetes however her blood sugars had improved as the prednisone was tapered. Below are excerpts from the patient's discharge summary Date of Admission:02/18/2019 2:18 PM Date of Discharge:02/21/2019 Attending Physician:Alexander Rebeca Alert, MD, PhD  Hospital Course by problem list: 1.COPD exacerbation. 67 year old woman withPMHxof COPD, DM II, and HTN admittedforrecurrent versus persistent COPD exacerbation.She had arecent admission (last month)for COPD exacerbation dueto Influenza Aand wasdischarged withaprednisone taper, but never returned tonormal.She had recentworsening of herproductivecough and shortness of breathwithcold sweats, nasal congestionandcame to ED for further evaluation. She denied any recent travel or possibleCOVID-19 exposure.  On arrival, she was tachycardic and tachypneic with wheezing,saturating at 89%.She had nofever and noleucocytosis. Lactic acid was elevated but then normalized when repeated.CXR withoutconsolidation or evidence of pneumonia. She receivedalbuteroland solumedrol treatmentat ED. Shealsowasstarted on IV Ceftriaxone and Prednisone 40 mg QD and receivedalbuterol and Ipratropium.Viral respiratory panel andSARS-CoV2test came back negative. Her symptoms improved with treatment.She maintained O2 satwell with ambulation at room air.She is discharged home with PO Cefdinir(total5 day course of antibiotic)and PO prednisone (tapering over 2 weeks.)  Hypokalemia:Patient hadK 3.0when admitted,likely 2/2 albuterol use. K replaced andwas 3.6 at discharge.  DM II:She is on Metformin 1000 mg BID at home thatwasheld in thehospital.She hadsome elevated BG between 200-270 when on Prednisone in hospitalthatwascontrolled with SSI.She should resume metformin at discharge.  Note since the last office visit she reports she has yet to receive her  Symbicort inhaler.  This was to have been  mailed to the patient but apparently because of insurance and lack of funding she was not able to obtain the medication.  She only has a nebulized DuoNeb which she takes as needed.  She notes an increased cough of yellow mucus and increased sinus drainage as well.  She has sneezing and a loss of smell.  She states she is not checking her blood glucose because she lost her glucose meter.  Patient states she is tried Flonase in the past this has not been of much benefit.  She did receive the losartan and is taking 100 mg of this daily along with hydrochlorothiazide and amlodipine.  .  She does not note she had seen [facility] several years ago.  She had a pulmonary function in 2016 which showed an FEV1 of 68% predicted diffusion capacity of 69% predicted.  Pos in BOLD Constitutional:   No  weight loss, night sweats,  Fevers, chills, fatigue, lassitude. HEENT:   No headaches,  Difficulty swallowing,  Tooth/dental problems,  Sore throat,                No sneezing, itching, ear ache, nasal congestion, post nasal drip,   CV:  No chest pain,  Orthopnea, PND, swelling in lower extremities, anasarca, dizziness, palpitations  GI  No heartburn, indigestion, abdominal pain, nausea, vomiting, diarrhea, change in bowel habits, loss of appetite  Resp: shortness of breath with exertion or at rest.  No excess mucus, no productive cough,   non-productive cough,  No coughing up of blood.  No change in color of mucus.  No wheezing.  No chest wall deformity  Skin: no rash or lesions.  GU: no dysuria, change in color of urine, no urgency or frequency.  No flank pain.  MS:  No joint pain or swelling.  No decreased range of motion.  No back pain.  Psych:  No change in mood or affect. No depressionr anxiety.  No memory loss.  Observations/Objective: There were no observations as this was a telephone visit  Assessment and Plan: #1 COPD with asthmatic  bronchitic component and previous history of allergic rhinitis and chronic sinusitis  Plan will be to obtain Advair HFA as we have samples of this medication 250/50 at 1 puff twice daily patient will also receive albuterol as needed  No additional prednisone is indicated however for the patient's allergic rhinitis we will begin Zyrtec 10 mg daily  #2 hypertension: The patient's not been measuring her blood pressure at home she will obtain a blood pressure cuff and measure this and report back for results and we will maintain medications as prescribed  #3 diabetes type 2 the patient will obtain a new glucose meter and report back her results of her CBGs and in the meantime we will maintain metformin at currently dosed level Note also a hepatitis C study will be obtained  Follow Up Instructions: The patient has an in office visit scheduled within the next month The patient will come in today for lab draws and we will also obtain her Advair and albuterol in our pharmacy today    I discussed the assessment and treatment plan with the patient. The patient was provided an opportunity to ask questions and all were answered. The patient agreed with the plan and demonstrated an understanding of the instructions.   The patient was advised to call back or seek an in-person evaluation if the symptoms worsen or if the condition fails to improve as anticipated.  I provided 55minutes of  non-face-to-face time during this encounter  including  median intraservice time , review of notes, labs, imaging, medications  and explaining diagnosis and management to the patient .    Asencion Noble, MD

## 2019-03-14 NOTE — Progress Notes (Signed)
Needs medication refill- would like 90 day supply-  - out of inhalers , unable to afford - needing to use the nebulizer more often.- 1-2 times daily. She states she is in a bit distress. Some discomfort  Runny nose, trace of yellow green drainage. At certain times she cant taste or smell  Hot flashes throughout the day, breaks out in a sweat.   States potassium was low in the hospital.  Was told she needed medication when She was discharged.

## 2019-03-15 LAB — CBC WITH DIFFERENTIAL/PLATELET
Basophils Absolute: 0 10*3/uL (ref 0.0–0.2)
Basos: 1 %
EOS (ABSOLUTE): 0.1 10*3/uL (ref 0.0–0.4)
Eos: 2 %
Hematocrit: 37.6 % (ref 34.0–46.6)
Hemoglobin: 12.7 g/dL (ref 11.1–15.9)
Immature Grans (Abs): 0 10*3/uL (ref 0.0–0.1)
Immature Granulocytes: 0 %
Lymphocytes Absolute: 2.4 10*3/uL (ref 0.7–3.1)
Lymphs: 37 %
MCH: 30.8 pg (ref 26.6–33.0)
MCHC: 33.8 g/dL (ref 31.5–35.7)
MCV: 91 fL (ref 79–97)
Monocytes Absolute: 0.4 10*3/uL (ref 0.1–0.9)
Monocytes: 6 %
Neutrophils Absolute: 3.5 10*3/uL (ref 1.4–7.0)
Neutrophils: 54 %
Platelets: 341 10*3/uL (ref 150–450)
RBC: 4.12 x10E6/uL (ref 3.77–5.28)
RDW: 12.5 % (ref 11.7–15.4)
WBC: 6.5 10*3/uL (ref 3.4–10.8)

## 2019-03-15 LAB — BASIC METABOLIC PANEL
BUN/Creatinine Ratio: 10 — ABNORMAL LOW (ref 12–28)
BUN: 9 mg/dL (ref 8–27)
CO2: 25 mmol/L (ref 20–29)
Calcium: 9.4 mg/dL (ref 8.7–10.3)
Chloride: 95 mmol/L — ABNORMAL LOW (ref 96–106)
Creatinine, Ser: 0.87 mg/dL (ref 0.57–1.00)
GFR calc Af Amer: 79 mL/min/{1.73_m2} (ref >59)
GFR calc non Af Amer: 69 mL/min/{1.73_m2} (ref >59)
Glucose: 108 mg/dL — ABNORMAL HIGH (ref 65–99)
Potassium: 4.4 mmol/L (ref 3.5–5.2)
Sodium: 135 mmol/L (ref 134–144)

## 2019-03-15 LAB — HEPATITIS C ANTIBODY: Hep C Virus Ab: <0.1 s/co ratio (ref 0.0–0.9)

## 2019-03-19 ENCOUNTER — Emergency Department (HOSPITAL_COMMUNITY): Payer: Medicare Other

## 2019-03-19 ENCOUNTER — Inpatient Hospital Stay (HOSPITAL_COMMUNITY)
Admission: EM | Admit: 2019-03-19 | Discharge: 2019-03-22 | DRG: 192 | Disposition: A | Discharge status: Home / Self Care | Payer: Medicare Other | Attending: Internal Medicine | Admitting: Internal Medicine

## 2019-03-19 ENCOUNTER — Encounter (HOSPITAL_COMMUNITY): Payer: Self-pay | Admitting: Emergency Medicine

## 2019-03-19 ENCOUNTER — Other Ambulatory Visit: Payer: Self-pay

## 2019-03-19 DIAGNOSIS — R011 Cardiac murmur, unspecified: Secondary | ICD-10-CM | POA: Diagnosis present

## 2019-03-19 DIAGNOSIS — R0602 Shortness of breath: Secondary | ICD-10-CM | POA: Diagnosis not present

## 2019-03-19 DIAGNOSIS — Z7951 Long term (current) use of inhaled steroids: Secondary | ICD-10-CM

## 2019-03-19 DIAGNOSIS — Z79899 Other long term (current) drug therapy: Secondary | ICD-10-CM

## 2019-03-19 DIAGNOSIS — M170 Bilateral primary osteoarthritis of knee: Secondary | ICD-10-CM | POA: Diagnosis present

## 2019-03-19 DIAGNOSIS — Z9119 Patient's noncompliance with other medical treatment and regimen: Secondary | ICD-10-CM

## 2019-03-19 DIAGNOSIS — R05 Cough: Secondary | ICD-10-CM | POA: Diagnosis not present

## 2019-03-19 DIAGNOSIS — Z9221 Personal history of antineoplastic chemotherapy: Secondary | ICD-10-CM

## 2019-03-19 DIAGNOSIS — J441 Chronic obstructive pulmonary disease with (acute) exacerbation: Principal | ICD-10-CM | POA: Diagnosis present

## 2019-03-19 DIAGNOSIS — M19041 Primary osteoarthritis, right hand: Secondary | ICD-10-CM | POA: Diagnosis present

## 2019-03-19 DIAGNOSIS — Z888 Allergy status to other drugs, medicaments and biological substances status: Secondary | ICD-10-CM

## 2019-03-19 DIAGNOSIS — F329 Major depressive disorder, single episode, unspecified: Secondary | ICD-10-CM | POA: Diagnosis present

## 2019-03-19 DIAGNOSIS — K219 Gastro-esophageal reflux disease without esophagitis: Secondary | ICD-10-CM | POA: Diagnosis present

## 2019-03-19 DIAGNOSIS — Z6832 Body mass index (BMI) 32.0-32.9, adult: Secondary | ICD-10-CM

## 2019-03-19 DIAGNOSIS — Z7984 Long term (current) use of oral hypoglycemic drugs: Secondary | ICD-10-CM

## 2019-03-19 DIAGNOSIS — E669 Obesity, unspecified: Secondary | ICD-10-CM | POA: Diagnosis present

## 2019-03-19 DIAGNOSIS — R000 Tachycardia, unspecified: Secondary | ICD-10-CM | POA: Diagnosis not present

## 2019-03-19 DIAGNOSIS — I10 Essential (primary) hypertension: Secondary | ICD-10-CM | POA: Diagnosis not present

## 2019-03-19 DIAGNOSIS — E876 Hypokalemia: Secondary | ICD-10-CM | POA: Diagnosis not present

## 2019-03-19 DIAGNOSIS — Z1159 Encounter for screening for other viral diseases: Secondary | ICD-10-CM

## 2019-03-19 DIAGNOSIS — I2510 Atherosclerotic heart disease of native coronary artery without angina pectoris: Secondary | ICD-10-CM | POA: Diagnosis present

## 2019-03-19 DIAGNOSIS — M940 Chondrocostal junction syndrome [Tietze]: Secondary | ICD-10-CM | POA: Diagnosis not present

## 2019-03-19 DIAGNOSIS — Z923 Personal history of irradiation: Secondary | ICD-10-CM

## 2019-03-19 DIAGNOSIS — E119 Type 2 diabetes mellitus without complications: Secondary | ICD-10-CM | POA: Diagnosis not present

## 2019-03-19 DIAGNOSIS — Z853 Personal history of malignant neoplasm of breast: Secondary | ICD-10-CM

## 2019-03-19 DIAGNOSIS — M19042 Primary osteoarthritis, left hand: Secondary | ICD-10-CM | POA: Diagnosis present

## 2019-03-19 DIAGNOSIS — F17210 Nicotine dependence, cigarettes, uncomplicated: Secondary | ICD-10-CM | POA: Diagnosis present

## 2019-03-19 DIAGNOSIS — J309 Allergic rhinitis, unspecified: Secondary | ICD-10-CM | POA: Diagnosis present

## 2019-03-19 DIAGNOSIS — E785 Hyperlipidemia, unspecified: Secondary | ICD-10-CM | POA: Diagnosis present

## 2019-03-19 DIAGNOSIS — F419 Anxiety disorder, unspecified: Secondary | ICD-10-CM | POA: Diagnosis present

## 2019-03-19 DIAGNOSIS — Z03818 Encounter for observation for suspected exposure to other biological agents ruled out: Secondary | ICD-10-CM | POA: Diagnosis not present

## 2019-03-19 DIAGNOSIS — Z90710 Acquired absence of both cervix and uterus: Secondary | ICD-10-CM

## 2019-03-19 LAB — RESPIRATORY PANEL BY PCR

## 2019-03-19 LAB — C-REACTIVE PROTEIN: CRP: 1 mg/dL — ABNORMAL HIGH (ref <1.0)

## 2019-03-19 LAB — CBC WITH DIFFERENTIAL/PLATELET
Abs Immature Granulocytes: 0.01 10*3/uL (ref 0.00–0.07)
Basophils Absolute: 0 10*3/uL (ref 0.0–0.1)
Basophils Relative: 0 %
Eosinophils Absolute: 0.2 10*3/uL (ref 0.0–0.5)
Eosinophils Relative: 5 %
HCT: 35.5 % — ABNORMAL LOW (ref 36.0–46.0)
Hemoglobin: 12.2 g/dL (ref 12.0–15.0)
Immature Granulocytes: 0 %
Lymphocytes Relative: 26 %
Lymphs Abs: 1.4 10*3/uL (ref 0.7–4.0)
MCH: 31 pg (ref 26.0–34.0)
MCHC: 34.4 g/dL (ref 30.0–36.0)
MCV: 90.3 fL (ref 80.0–100.0)
Monocytes Absolute: 0.4 10*3/uL (ref 0.1–1.0)
Monocytes Relative: 7 %
Neutro Abs: 3.2 10*3/uL (ref 1.7–7.7)
Neutrophils Relative %: 62 %
Platelets: 319 10*3/uL (ref 150–400)
RBC: 3.93 MIL/uL (ref 3.87–5.11)
RDW: 12.2 % (ref 11.5–15.5)
WBC: 5.2 10*3/uL (ref 4.0–10.5)
nRBC: 0 % (ref 0.0–0.2)

## 2019-03-19 LAB — BASIC METABOLIC PANEL
Anion gap: 19 — ABNORMAL HIGH (ref 5–15)
BUN: 8 mg/dL (ref 8–23)
CO2: 24 mmol/L (ref 22–32)
Calcium: 9.4 mg/dL (ref 8.9–10.3)
Chloride: 94 mmol/L — ABNORMAL LOW (ref 98–111)
Creatinine, Ser: 0.93 mg/dL (ref 0.44–1.00)
GFR calc Af Amer: >60 mL/min (ref >60)
GFR calc non Af Amer: >60 mL/min (ref >60)
Glucose, Bld: 129 mg/dL — ABNORMAL HIGH (ref 70–99)
Potassium: 3.1 mmol/L — ABNORMAL LOW (ref 3.5–5.1)
Sodium: 137 mmol/L (ref 135–145)

## 2019-03-19 LAB — GLUCOSE, CAPILLARY
Glucose-Capillary: 242 mg/dL — ABNORMAL HIGH (ref 70–99)
Glucose-Capillary: 208 mg/dL — ABNORMAL HIGH (ref 70–99)

## 2019-03-19 LAB — HEPATIC FUNCTION PANEL
ALT: 10 U/L (ref 0–44)
AST: 15 U/L (ref 15–41)
Albumin: 3.6 g/dL (ref 3.5–5.0)
Alkaline Phosphatase: 79 U/L (ref 38–126)
Bilirubin, Direct: 0.1 mg/dL (ref 0.0–0.2)
Indirect Bilirubin: 0.9 mg/dL (ref 0.3–0.9)
Total Bilirubin: 1 mg/dL (ref 0.3–1.2)
Total Protein: 6.9 g/dL (ref 6.5–8.1)

## 2019-03-19 LAB — TROPONIN I: Troponin I: <0.03 ng/mL (ref <0.03)

## 2019-03-19 LAB — LACTIC ACID, PLASMA
Lactic Acid, Venous: 2.9 mmol/L (ref 0.5–1.9)
Lactic Acid, Venous: 1.9 mmol/L (ref 0.5–1.9)

## 2019-03-19 LAB — SARS CORONAVIRUS 2 BY RT PCR (HOSPITAL ORDER, PERFORMED IN ~~LOC~~ HOSPITAL LAB): SARS Coronavirus 2: NEGATIVE

## 2019-03-19 MED ORDER — ALBUTEROL SULFATE HFA 108 (90 BASE) MCG/ACT IN AERS
8.0000 | INHALATION_SPRAY | Freq: Once | RESPIRATORY_TRACT | Status: AC
  Administered 2019-03-19: 8 via RESPIRATORY_TRACT
  Filled 2019-03-19: qty 6.7

## 2019-03-19 MED ORDER — DULOXETINE HCL 60 MG PO CPEP
60.0000 mg | ORAL_CAPSULE | Freq: Every day | ORAL | Status: DC
  Administered 2019-03-19 – 2019-03-22 (×4): 60 mg via ORAL
  Filled 2019-03-19 (×4): qty 1

## 2019-03-19 MED ORDER — SIMVASTATIN 20 MG PO TABS
40.0000 mg | ORAL_TABLET | Freq: Every day | ORAL | Status: DC
  Administered 2019-03-19 – 2019-03-21 (×3): 40 mg via ORAL
  Filled 2019-03-19 (×3): qty 2

## 2019-03-19 MED ORDER — LORATADINE 10 MG PO TABS: 10.0000 mg | ORAL_TABLET | Freq: Every day | ORAL | Status: DC

## 2019-03-19 MED ORDER — METOPROLOL TARTRATE 25 MG PO TABS
25.0000 mg | ORAL_TABLET | Freq: Two times a day (BID) | ORAL | Status: DC
  Administered 2019-03-19 – 2019-03-22 (×6): 25 mg via ORAL
  Filled 2019-03-19 (×6): qty 1

## 2019-03-19 MED ORDER — LOSARTAN POTASSIUM 50 MG PO TABS
100.0000 mg | ORAL_TABLET | Freq: Every day | ORAL | Status: DC
  Administered 2019-03-19 – 2019-03-22 (×4): 100 mg via ORAL
  Filled 2019-03-19 (×4): qty 2

## 2019-03-19 MED ORDER — HYDROCHLOROTHIAZIDE 25 MG PO TABS
25.0000 mg | ORAL_TABLET | Freq: Every day | ORAL | Status: DC
  Administered 2019-03-19 – 2019-03-22 (×4): 25 mg via ORAL
  Filled 2019-03-19 (×4): qty 1

## 2019-03-19 MED ORDER — KETOROLAC TROMETHAMINE 30 MG/ML IJ SOLN
30.0000 mg | Freq: Once | INTRAMUSCULAR | Status: AC
  Administered 2019-03-19: 30 mg via INTRAVENOUS
  Filled 2019-03-19: qty 1

## 2019-03-19 MED ORDER — MAGNESIUM SULFATE 2 GM/50ML IV SOLN
2.0000 g | Freq: Once | INTRAVENOUS | Status: AC
  Administered 2019-03-19: 2 g via INTRAVENOUS
  Filled 2019-03-19: qty 50

## 2019-03-19 MED ORDER — SODIUM CHLORIDE 0.9 % IV BOLUS
500.0000 mL | Freq: Once | INTRAVENOUS | Status: AC
  Administered 2019-03-19: 500 mL via INTRAVENOUS

## 2019-03-19 MED ORDER — AMLODIPINE BESYLATE 10 MG PO TABS
10.0000 mg | ORAL_TABLET | Freq: Every day | ORAL | Status: DC
  Administered 2019-03-19 – 2019-03-22 (×4): 10 mg via ORAL
  Filled 2019-03-19 (×4): qty 1

## 2019-03-19 MED ORDER — INSULIN ASPART 100 UNIT/ML ~~LOC~~ SOLN
0.0000 [IU] | Freq: Three times a day (TID) | SUBCUTANEOUS | Status: DC
  Administered 2019-03-19: 5 [IU] via SUBCUTANEOUS
  Administered 2019-03-20: 2 [IU] via SUBCUTANEOUS
  Administered 2019-03-20: 5 [IU] via SUBCUTANEOUS
  Administered 2019-03-20: 3 [IU] via SUBCUTANEOUS

## 2019-03-19 MED ORDER — IPRATROPIUM-ALBUTEROL 0.5-2.5 (3) MG/3ML IN SOLN
3.0000 mL | Freq: Four times a day (QID) | RESPIRATORY_TRACT | Status: DC
  Administered 2019-03-19 – 2019-03-20 (×5): 3 mL via RESPIRATORY_TRACT
  Filled 2019-03-19 (×5): qty 3

## 2019-03-19 MED ORDER — ACETAMINOPHEN 325 MG PO TABS
650.0000 mg | ORAL_TABLET | Freq: Four times a day (QID) | ORAL | Status: DC | PRN
  Administered 2019-03-20: 650 mg via ORAL
  Filled 2019-03-19 (×2): qty 2

## 2019-03-19 MED ORDER — ACETAMINOPHEN 650 MG RE SUPP: 650.0000 mg | Freq: Four times a day (QID) | RECTAL | Status: DC | PRN

## 2019-03-19 MED ORDER — KETOROLAC TROMETHAMINE 30 MG/ML IJ SOLN
30.0000 mg | Freq: Three times a day (TID) | INTRAMUSCULAR | Status: DC
  Administered 2019-03-19: 30 mg via INTRAVENOUS
  Filled 2019-03-19: qty 1

## 2019-03-19 MED ORDER — OXYCODONE-ACETAMINOPHEN 5-325 MG PO TABS
1.0000 | ORAL_TABLET | Freq: Three times a day (TID) | ORAL | Status: DC | PRN
  Administered 2019-03-20 – 2019-03-22 (×6): 2 via ORAL
  Filled 2019-03-19 (×6): qty 2

## 2019-03-19 MED ORDER — METHYLPREDNISOLONE SODIUM SUCC 125 MG IJ SOLR
125.0000 mg | Freq: Once | INTRAMUSCULAR | Status: AC
  Administered 2019-03-19: 125 mg via INTRAVENOUS
  Filled 2019-03-19: qty 2

## 2019-03-19 MED ORDER — POTASSIUM CHLORIDE CRYS ER 20 MEQ PO TBCR
40.0000 meq | EXTENDED_RELEASE_TABLET | Freq: Once | ORAL | Status: AC
  Administered 2019-03-19: 40 meq via ORAL
  Filled 2019-03-19: qty 2

## 2019-03-19 MED ORDER — SENNOSIDES-DOCUSATE SODIUM 8.6-50 MG PO TABS: 1.0000 | ORAL_TABLET | Freq: Every evening | ORAL | Status: DC | PRN

## 2019-03-19 MED ORDER — ENOXAPARIN SODIUM 40 MG/0.4ML ~~LOC~~ SOLN
40.0000 mg | SUBCUTANEOUS | Status: DC
  Administered 2019-03-19 – 2019-03-21 (×3): 40 mg via SUBCUTANEOUS
  Filled 2019-03-19 (×3): qty 0.4

## 2019-03-19 MED ORDER — PREDNISONE 20 MG PO TABS
40.0000 mg | ORAL_TABLET | Freq: Every day | ORAL | Status: DC
  Administered 2019-03-20 – 2019-03-22 (×3): 40 mg via ORAL
  Filled 2019-03-19 (×3): qty 2

## 2019-03-19 MED ORDER — MOMETASONE FURO-FORMOTEROL FUM 200-5 MCG/ACT IN AERO
2.0000 | INHALATION_SPRAY | Freq: Two times a day (BID) | RESPIRATORY_TRACT | Status: DC
  Administered 2019-03-19 – 2019-03-22 (×6): 2 via RESPIRATORY_TRACT
  Filled 2019-03-19: qty 8.8

## 2019-03-19 MED ORDER — OXYCODONE-ACETAMINOPHEN 5-325 MG PO TABS: 1.0000 | ORAL_TABLET | Freq: Two times a day (BID) | ORAL | Status: DC | PRN

## 2019-03-19 MED ORDER — LIDOCAINE 5 % EX PTCH
1.0000 | MEDICATED_PATCH | CUTANEOUS | Status: DC
  Administered 2019-03-19 – 2019-03-20 (×2): 1 via TRANSDERMAL
  Filled 2019-03-19 (×3): qty 1

## 2019-03-19 NOTE — ED Notes (Signed)
[name], Wellsite geologist on [address] to call this RN back once patient assigned room.

## 2019-03-19 NOTE — ED Provider Notes (Signed)
[facility] EMERGENCY DEPARTMENT Provider Note   CSN: [id number] Arrival date & time: 03/19/19  5027    History   Chief Complaint Chief Complaint  Patient presents with   Shortness of Breath   Anxiety    HPI [name] is a 68 y.o. female.  She has a history of significant COPD and gets frequently hospitalized for same.  She was last admitted about 3 weeks ago for COPD exacerbation.  She said she did well for about a week and then has progressively been getting more short of breath again.  She is complaining of sinus congestion and has had some yellow green-tinged sputum when she coughs.  She is complaining of right lateral low chest pain that she calls broken ribs from coughing.  No reported fever although has felt hot and cold and said she woke up and sweats at night.  No vomiting or diarrhea no urinary symptoms.  She was treated with steroids and antibiotics during the last admission and is tapered off of them now.     The history is provided by the patient.  Shortness of Breath  Severity:  Severe Onset quality:  Gradual Timing:  Constant Progression:  Worsening Chronicity:  Recurrent Context: URI   Relieved by:  Nothing Worsened by:  Activity Ineffective treatments:  Inhaler Associated symptoms: cough, sputum production and wheezing   Associated symptoms: no abdominal pain, no chest pain, no fever, no hemoptysis, no neck pain, no rash, no sore throat and no vomiting   Risk factors: hx of cancer and tobacco use     Past Medical History:  Diagnosis Date   Anxiety    Arthritis    "fingers, knees" (08/16/2018)   Asthma    Cancer of right breast ([place]) 1991   s/p lumpectomy, chemotherapy and radiation therapy in 1991. Mammogram in 2007 was normal.   Constipated    h/o   COPD (chronic obstructive pulmonary disease) ([place])    History of multiple hospital admissions for exercabation    COPD exacerbation ([place]) 01/01/2019   COPD with acute  exacerbation ([place]) 01/14/2019   COPD with exacerbation ([place]) 04/06/2009   Qualifier: Diagnosis of  By: Eyvonne Mechanic MD, Vijay     Depression    Diarrhea    h/o   GERD (gastroesophageal reflux disease)    Headache    "a few times/month" (08/16/2018   Heart murmur 10/05/11   "first time I ever heard I had one was today"   Hyperlipidemia    Hypertension    Obesity    Personal history of chemotherapy    Personal history of radiation therapy    Pneumonia    "couple times in the last 10-15 yrs" (08/16/2018)   Shortness of breath 10/05/11   "at rest; lying down; w/exertion"   Sigmoid diverticulitis 80/2008   Tobacco abuse    Type II diabetes mellitus ([place])     Patient Active Problem List   Diagnosis Date Noted   Hypomagnesemia    Influenza A 01/14/2019   Acute frontal sinusitis 01/09/2019   Anxiety and depression 01/09/2019   Abscess of head 01/09/2019   [place]obe lesion, left 12/26/2018   Type 2 diabetes mellitus with complication, without long-term current use of insulin ([place]) 12/26/2018   Axillary hidradenitis suppurativa 10/13/2018   Dizziness 09/21/2018   Lower extremity edema 09/21/2018   Irregular cardiac rhythm 08/25/2018   Chronic recurrent sinusitis    Alopecia of scalp 07/25/2018   Bruising 06/21/2018   Weight loss  06/21/2018   Itching 06/21/2018   Atopic dermatitis 03/20/2018   Seasonal allergies 02/25/2017   Diarrhea    Chronic obstructive pulmonary disease with bronchospasm ([place]) 12/24/2014   QT prolongation 08/08/2014   Dyslipidemia associated with type 2 diabetes mellitus ([place]) 06/14/2014   Hypokalemia 12/20/2012   History of breast cancer 12/01/2012   Primary osteoarthritis of right knee 09/29/2012   GAD (generalized anxiety disorder) 12/30/2009   Type 2 diabetes mellitus ([place]) 05/14/2009   Tobacco abuse 04/06/2009   Hypertension, benign essential, goal below 140/90 04/06/2009    Past Surgical History:  Procedure  Laterality Date   ABDOMINAL HYSTERECTOMY     ANTERIOR CERVICAL DECOMP/DISCECTOMY FUSION  2012   "Dr. Lynann Bologna  put plate in; did something to my vertebrae"   BACK SURGERY     BREAST LUMPECTOMY Right 1991   DOBUTAMINE STRESS ECHO  08/2004   Inferior ischemia, normal LV systolic function, no significant CAD     OB History   No obstetric history on file.      Home Medications    Prior to Admission medications   Medication Sig Start Date End Date Taking? Authorizing Provider  Accu-Chek FastClix Lancets MISC Use as instructed to test blood sugar 1-2 times daily. 03/14/19   [name], MD  albuterol (VENTOLIN HFA) 108 (90 Base) MCG/ACT inhaler Inhale 2 puffs into the lungs every 6 (six) hours as needed for wheezing or shortness of breath. 03/14/19   [name], MD  amLODipine (NORVASC) 10 MG tablet Take 1 tablet (10 mg total) by mouth daily. 03/14/19   [name], MD  Blood Glucose Monitoring Suppl (ACCU-CHEK GUIDE) w/Device KIT 1 each by Does not apply route See admin instructions. Use as instructed to test blood sugar 1-2 times daily. 03/14/19   [name], MD  cetirizine (ZYRTEC) 10 MG tablet Take 1 tablet (10 mg total) by mouth daily. 03/14/19   [name], MD  DULoxetine (CYMBALTA) 60 MG capsule Take 1 capsule (60 mg total) by mouth daily. 03/14/19   [name], MD  Fluticasone-Salmeterol (ADVAIR) 250-50 MCG/DOSE AEPB Inhale 1 puff into the lungs 2 (two) times daily. 03/14/19   [name], MD  glucose blood (ACCU-CHEK GUIDE) test strip Use as instructed to test blood sugar 1-2 times daily. 03/14/19   [name], MD  hydrochlorothiazide (HYDRODIURIL) 25 MG tablet Take 1 tablet (25 mg total) by mouth daily. 03/14/19   [name], MD  ipratropium-albuterol (DUONEB) 0.5-2.5 (3) MG/3ML SOLN Take 3 mLs by nebulization every 6 (six) hours as needed. 02/13/19   [name], MD  losartan (COZAAR) 100 MG tablet Take 1 tablet (100 mg total)  by mouth daily. 03/14/19   [name], MD  metFORMIN (GLUCOPHAGE) 1000 MG tablet Take 1 tablet (1,000 mg total) by mouth 2 (two) times daily. 03/14/19   [name], MD  metoprolol tartrate (LOPRESSOR) 25 MG tablet Take 1 tablet (25 mg total) by mouth 2 (two) times daily. 03/14/19   [name], MD  simvastatin (ZOCOR) 40 MG tablet Take 1 tablet (40 mg total) by mouth daily at 6 PM. 03/14/19   [name], MD    Family History Family History  Problem Relation Age of Onset   Cancer Mother     Social History Social History   Tobacco Use   Smoking status: Current Every Day Smoker    Packs/day: 0.50    Years: 45.00  Pack years: 22.50    Types: Cigarettes   Smokeless tobacco: Never Used  Substance Use Topics   Alcohol use: Yes    Alcohol/week: 4.0 standard drinks    Types: 4 Cans of beer per week    Comment: 08/16/2018 "weekends only"   Drug use: No     Allergies   Ace inhibitors and Flonase [fluticasone]   Review of Systems Review of Systems  Constitutional: Negative for fever.  HENT: Negative for sore throat.   Eyes: Negative for visual disturbance.  Respiratory: Positive for cough, sputum production, shortness of breath and wheezing. Negative for hemoptysis.   Cardiovascular: Negative for chest pain.  Gastrointestinal: Negative for abdominal pain and vomiting.  Genitourinary: Negative for dysuria.  Musculoskeletal: Negative for neck pain.  Skin: Negative for rash.  Neurological: Negative for syncope.     Physical Exam Updated Vital Signs BP (!) 142/100 (BP Location: Right Arm)    Pulse (!) 119    Temp 99.3 F (37.4 C) (Oral)    Resp (!) 38    Ht '5\' 6"'$  (1.676 m)    Wt 90.7 kg    SpO2 99%    BMI 32.28 kg/m   Physical Exam Vitals signs and nursing note reviewed.  Constitutional:      General: She is not in acute distress.    Appearance: She is well-developed.  HENT:     Head: Normocephalic and atraumatic.  Eyes:     Conjunctiva/sclera:  Conjunctivae normal.  Neck:     Musculoskeletal: Neck supple.  Cardiovascular:     Rate and Rhythm: Regular rhythm. Tachycardia present.     Heart sounds: No murmur.  Pulmonary:     Effort: Tachypnea and accessory muscle usage present. No respiratory distress.     Breath sounds: No stridor. Wheezing (scattered) present.  Chest:     Chest wall: Tenderness (right lower lateral) present. No crepitus.  Abdominal:     Palpations: Abdomen is soft.     Tenderness: There is no abdominal tenderness.  Musculoskeletal:     Right lower leg: She exhibits no tenderness.     Left lower leg: She exhibits no tenderness.  Skin:    General: Skin is warm and dry.     Capillary Refill: Capillary refill takes less than 2 seconds.  Neurological:     General: No focal deficit present.     Mental Status: She is alert and oriented to person, place, and time.      ED Treatments / Results  Labs (all labs ordered are listed, but only abnormal results are displayed) Labs Reviewed  BASIC METABOLIC PANEL - Abnormal; Notable for the following components:      Result Value   Potassium 3.1 (*)    Chloride 94 (*)    Glucose, Bld 129 (*)    Anion gap 19 (*)    All other components within normal limits  CBC WITH DIFFERENTIAL/PLATELET - Abnormal; Notable for the following components:   HCT 35.5 (*)    All other components within normal limits  LACTIC ACID, PLASMA - Abnormal; Notable for the following components:   Lactic Acid, Venous 2.9 (*)    All other components within normal limits  C-REACTIVE PROTEIN - Abnormal; Notable for the following components:   CRP 1.0 (*)    All other components within normal limits  GLUCOSE, CAPILLARY - Abnormal; Notable for the following components:   Glucose-Capillary 242 (*)    All other components within normal limits  SARS CORONAVIRUS  2 (HOSPITAL ORDER, [facility])  RESPIRATORY PANEL BY PCR  CULTURE, BLOOD (ROUTINE X 2)  CULTURE, BLOOD  (ROUTINE X 2)  TROPONIN I  LACTIC ACID, PLASMA  HEPATIC FUNCTION PANEL    EKG EKG Interpretation  Date/Time:  Monday Mar 19 2019 09:42:20 EDT Ventricular Rate:  115 PR Interval:    QRS Duration: 97 QT Interval:  353 QTC Calculation: 489 R Axis:   74 Text Interpretation:  Sinus tachycardia Biatrial enlargement Probable anteroseptal infarct, old Minimal ST depression, diffuse leads Artifact in lead(s) I II III aVR aVL aVF V1 similar to prior 4/20 Confirmed by Aletta Edouard [id number]) on 03/19/2019 9:49:06 AM   Radiology Dg Chest Port 1 View  Result Date: 03/19/2019 CLINICAL DATA:  Cough, shortness of breath. EXAM: PORTABLE CHEST 1 VIEW COMPARISON:  Radiographs of February 18, 2019. FINDINGS: The heart size and mediastinal contours are within normal limits. Both lungs are clear. No pneumothorax or pleural effusion is noted. Right axillary surgical clips are noted. The visualized skeletal structures are unremarkable. IMPRESSION: No active disease. Electronically Signed   By: Marijo Conception M.D.   On: 03/19/2019 10:15    Procedures Procedures (including critical care time)  Medications Ordered in ED Medications  magnesium sulfate IVPB 2 g 50 mL (has no administration in time range)  methylPREDNISolone sodium succinate (SOLU-MEDROL) 125 mg/2 mL injection 125 mg (has no administration in time range)  albuterol (VENTOLIN HFA) 108 (90 Base) MCG/ACT inhaler 8 puff (has no administration in time range)     Initial Impression / Assessment and Plan / ED Course  I have reviewed the triage vital signs and the nursing notes.  Pertinent labs & imaging results that were available during my care of the patient were reviewed by me and considered in my medical decision making (see chart for details).  Clinical Course as of Mar 18 1856  Mon Mar 19, 2019  0949 Differential diagnosis includes Covid, COPD exacerbation, pneumonia, ACS, pneumothorax   [MB]  1033 Patient's lab work beginning to come  back.  She has a low-grade temperature of 99.3.  She is tachypneic and tachycardic.  She appears very anxious.  Her lab work is coming back with a normal white count.  Her lactic acid is elevated at 2.9.  This could be infectious in etiology but also she is just very hyperdynamic.  She is getting some IV fluids.  Will need to trend this.  Holding on antibiotics for now.   [MB]  4536 Patient requesting IV team place her IV due to poor veins and prior chemotherapy.  Her lab work has been started with straight stick.  She will be slow in getting any IV fluids or medications ordered until an IV is established.   [MB]  4680 Reevaluated patient.  She said she had been feeling little bit better but now feels short of breath again.  Her lab work-up is been fairly unremarkable except for mildly low potassium at 3.1.  Her lactate was slightly up but has cleared.  Covid negative.  She does not feel well enough to go home and so I have called the medicine team regarding admission.   [MB]    Clinical Course User Index [MB] Hayden Rasmussen, MD   [name] was evaluated in Emergency Department on 03/19/2019 for the symptoms described in the history of present illness. She was evaluated in the context of the global COVID-19 pandemic, which necessitated consideration that the patient might  be at risk for infection with the SARS-CoV-2 virus that causes COVID-19. Institutional protocols and algorithms that pertain to the evaluation of patients at risk for COVID-19 are in a state of rapid change based on information released by regulatory bodies including the CDC and federal and state organizations. These policies and algorithms were followed during the patient's care in the ED.      Final Clinical Impressions(s) / ED Diagnoses   Final diagnoses:  COPD exacerbation [facility])    ED Discharge Orders    None       Hayden Rasmussen, MD 03/19/19 1858

## 2019-03-19 NOTE — ED Notes (Addendum)
ED TO INPATIENT HANDOFF REPORT  ED Nurse Name and Phone #: Nigel Mormon [phone]  S Name/Age/Gender [name] 68 y.o. female Room/Bed: 015C/015C  Code Status   Code Status: Prior  Home/SNF/Other Home Patient oriented to: self, place, time and situation Is this baseline? Yes   Triage Complete: Triage complete  Chief Complaint SOB  Triage Note Pt in with sob, anxiety. Denies any cp, has cough, temp 98.6. hx of COPD and asthma, sats 97% on RA   Allergies Allergies  Allergen Reactions  . Ace Inhibitors Swelling    Throat swelling. Lisinopril-HCTZ Pt states she in not allergic   . Flonase [Fluticasone] Other (See Comments)    Sinuses stop up and condition worsens    Level of Care/Admitting Diagnosis ED Disposition    ED Disposition Condition [facility] Area: [place] [[id number]]  Level of Care: Med-Surg [16]  Covid Evaluation: N/A  Diagnosis: COPD exacerbation [facility]) [[id number]]  Admitting Physician: Oda Kilts [[id number]]  Attending Physician: Oda Kilts [id number]  PT Class (Do Not Modify): Observation [104]  PT Acc Code (Do Not Modify): Observation [10022]       B Medical/Surgery History Past Medical History:  Diagnosis Date  . Anxiety   . Arthritis    "fingers, knees" (08/16/2018)  . Asthma   . Cancer of right breast ([place]) 1991   s/p lumpectomy, chemotherapy and radiation therapy in 1991. Mammogram in 2007 was normal.  . Constipated    h/o  . COPD (chronic obstructive pulmonary disease) ([place])    History of multiple hospital admissions for exercabation   . COPD exacerbation ([place]) 01/01/2019  . COPD with acute exacerbation ([place]) 01/14/2019  . COPD with exacerbation ([place]) 04/06/2009   Qualifier: Diagnosis of  By: Eyvonne Mechanic MD, Vijay    . Depression   . Diarrhea    h/o  . GERD (gastroesophageal reflux disease)   . Headache    "a few times/month" (08/16/2018  . Heart murmur 10/05/11   "first time I  ever heard I had one was today"  . Hyperlipidemia   . Hypertension   . Obesity   . Personal history of chemotherapy   . Personal history of radiation therapy   . Pneumonia    "couple times in the last 10-15 yrs" (08/16/2018)  . Shortness of breath 10/05/11   "at rest; lying down; w/exertion"  . Sigmoid diverticulitis 80/2008  . Tobacco abuse   . Type II diabetes mellitus ([place])    Past Surgical History:  Procedure Laterality Date  . ABDOMINAL HYSTERECTOMY    . ANTERIOR CERVICAL DECOMP/DISCECTOMY FUSION  2012   "Dr. Lynann Bologna  put plate in; did something to my vertebrae"  . BACK SURGERY    . BREAST LUMPECTOMY Right 1991  . DOBUTAMINE STRESS ECHO  08/2004   Inferior ischemia, normal LV systolic function, no significant CAD     A IV Location/Drains/Wounds Patient Lines/Drains/Airways Status   Active Line/Drains/Airways    Name:   Placement date:   Placement time:   Site:   Days:   Peripheral IV 02/19/19 Left Antecubital   02/19/19    0112    Antecubital   28   Peripheral IV 03/19/19 Right;Anterior Forearm   03/19/19    1053    Forearm   less than 1          Intake/Output Last 24 hours  Intake/Output Summary (Last 24 hours) at 03/19/2019 1421 Last data filed at 03/19/2019 1314  Gross per 24 hour  Intake 550 ml  Output -  Net 550 ml    Labs/Imaging Results for orders placed or performed during the hospital encounter of 03/19/19 (from the past 48 hour(s))  SARS Coronavirus 2 (CEPHEID- Performed in [facility] hospital lab), Hosp Order     Status: None   Collection Time: 03/19/19  9:45 AM  Result Value Ref Range   SARS Coronavirus 2 NEGATIVE NEGATIVE    Comment: (NOTE) If result is NEGATIVE SARS-CoV-2 target nucleic acids are NOT DETECTED. The SARS-CoV-2 RNA is generally detectable in upper and lower  respiratory specimens during the acute phase of infection. The lowest  concentration of SARS-CoV-2 viral copies this assay can detect is 250  copies / mL. A negative result  does not preclude SARS-CoV-2 infection  and should not be used as the sole basis for treatment or other  patient management decisions.  A negative result may occur with  improper specimen collection / handling, submission of specimen other  than nasopharyngeal swab, presence of viral mutation(s) within the  areas targeted by this assay, and inadequate number of viral copies  (<250 copies / mL). A negative result must be combined with clinical  observations, patient history, and epidemiological information. If result is POSITIVE SARS-CoV-2 target nucleic acids are DETECTED. The SARS-CoV-2 RNA is generally detectable in upper and lower  respiratory specimens dur ing the acute phase of infection.  Positive  results are indicative of active infection with SARS-CoV-2.  Clinical  correlation with patient history and other diagnostic information is  necessary to determine patient infection status.  Positive results do  not rule out bacterial infection or co-infection with other viruses. If result is PRESUMPTIVE POSTIVE SARS-CoV-2 nucleic acids MAY BE PRESENT.   A presumptive positive result was obtained on the submitted specimen  and confirmed on repeat testing.  While 2019 novel coronavirus  (SARS-CoV-2) nucleic acids may be present in the submitted sample  additional confirmatory testing may be necessary for epidemiological  and / or clinical management purposes  to differentiate between  SARS-CoV-2 and other Sarbecovirus currently known to infect humans.  If clinically indicated additional testing with an alternate test  methodology [phone]) is advised. The SARS-CoV-2 RNA is generally  detectable in upper and lower respiratory sp ecimens during the acute  phase of infection. The expected result is Negative. Fact Sheet for Patients:  StrictlyIdeas.no Fact Sheet for Healthcare Providers: BankingDealers.co.za This test is not yet approved or  cleared by the [place] FDA and has been authorized for detection and/or diagnosis of SARS-CoV-2 by FDA under an Emergency Use Authorization (EUA).  This EUA will remain in effect (meaning this test can be used) for the duration of the COVID-19 declaration under Section 564(b)(1) of the Act, 21 U.S.C. section 360bbb-3(b)(1), unless the authorization is terminated or revoked sooner. Performed at [facility], [place] [address], [place], [place] [address]   Basic metabolic panel     Status: Abnormal   Collection Time: 03/19/19  9:48 AM  Result Value Ref Range   Sodium 137 135 - 145 mmol/L   Potassium 3.1 (L) 3.5 - 5.1 mmol/L   Chloride 94 (L) 98 - 111 mmol/L   CO2 24 22 - 32 mmol/L   Glucose, Bld 129 (H) 70 - 99 mg/dL   BUN 8 8 - 23 mg/dL   Creatinine, Ser 0.93 0.44 - 1.00 mg/dL   Calcium 9.4 8.9 - 10.3 mg/dL   GFR calc non Af Amer >  60 >60 mL/min   GFR calc Af Amer >60 >60 mL/min   Anion gap 19 (H) 5 - 15    Comment: Performed at [facility] [address] [place], [place] [address]  CBC with Differential/Platelet     Status: Abnormal   Collection Time: 03/19/19  9:48 AM  Result Value Ref Range   WBC 5.2 4.0 - 10.5 K/uL   RBC 3.93 3.87 - 5.11 MIL/uL   Hemoglobin 12.2 12.0 - 15.0 g/dL   HCT 35.5 (L) 36.0 - 46.0 %   MCV 90.3 80.0 - 100.0 fL   MCH 31.0 26.0 - 34.0 pg   MCHC 34.4 30.0 - 36.0 g/dL   RDW 12.2 11.5 - 15.5 %   Platelets 319 150 - 400 K/uL   nRBC 0.0 0.0 - 0.2 %   Neutrophils Relative % 62 %   Neutro Abs 3.2 1.7 - 7.7 K/uL   Lymphocytes Relative 26 %   Lymphs Abs 1.4 0.7 - 4.0 K/uL   Monocytes Relative 7 %   Monocytes Absolute 0.4 0.1 - 1.0 K/uL   Eosinophils Relative 5 %   Eosinophils Absolute 0.2 0.0 - 0.5 K/uL   Basophils Relative 0 %   Basophils Absolute 0.0 0.0 - 0.1 K/uL   Immature Granulocytes 0 %   Abs Immature Granulocytes 0.01 0.00 - 0.07 K/uL    Comment: Performed at [facility] [address], [place], [place] [address]   Troponin I - Once     Status: None   Collection Time: 03/19/19  9:48 AM  Result Value Ref Range   Troponin I <0.03 <0.03 ng/mL    Comment: Performed at [facility] [address], [place], [place] [address]  Lactic acid, plasma     Status: Abnormal   Collection Time: 03/19/19  9:48 AM  Result Value Ref Range   Lactic Acid, Venous 2.9 (HH) 0.5 - 1.9 mmol/L    Comment: CRITICAL RESULT CALLED TO, READ BACK BY AND VERIFIED WITH: K.MOON,RN 1030 03/19/2019 CLARK,S Performed at [facility], [place] [address] [place], [place] [address]   Lactic acid, plasma     Status: None   Collection Time: 03/19/19 11:50 AM  Result Value Ref Range   Lactic Acid, Venous 1.9 0.5 - 1.9 mmol/L    Comment: Performed at [facility] [address], [place],  [address]   Dg Chest Port 1 View  Result Date: 03/19/2019 CLINICAL DATA:  Cough, shortness of breath. EXAM: PORTABLE CHEST 1 VIEW COMPARISON:  Radiographs of February 18, 2019. FINDINGS: The heart size and mediastinal contours are within normal limits. Both lungs are clear. No pneumothorax or pleural effusion is noted. Right axillary surgical clips are noted. The visualized skeletal structures are unremarkable. IMPRESSION: No active disease. Electronically Signed   By: Marijo Conception M.D.   On: 03/19/2019 10:15    Pending Labs Unresulted Labs (From admission, onward)    Start     Ordered   03/19/19 1500  C-reactive protein  Once,   STAT     03/19/19 1500   03/19/19 1348  Respiratory Panel by PCR  (Respiratory virus panel with precautions)  Add-on,   R     03/19/19 1347   03/19/19 1332  Hepatic function panel  Add-on,   R     03/19/19 1331   03/19/19 0945  Culture, blood (routine x 2)  BLOOD CULTURE X 2,   STAT  Question:  Patient immune status  Answer:  Normal   03/19/19 0944          Vitals/Pain Today's Vitals   03/19/19 0940 03/19/19 0941 03/19/19 1230 03/19/19 1330  BP:   129/81 135/87  Pulse:   (!) 112 99  Resp:    20 20  Temp:      TempSrc:      SpO2: 99%  98% 97%  Weight:  90.7 kg    Height:  5\' 6"  (1.676 m)    PainSc:   7      Isolation Precautions Droplet precaution  Medications Medications  potassium chloride SA (K-DUR) CR tablet 40 mEq (has no administration in time range)  magnesium sulfate IVPB 2 g 50 mL (0 g Intravenous Stopped 03/19/19 1314)  methylPREDNISolone sodium succinate (SOLU-MEDROL) 125 mg/2 mL injection 125 mg (125 mg Intravenous Given 03/19/19 1100)  albuterol (VENTOLIN HFA) 108 (90 Base) MCG/ACT inhaler 8 puff (8 puffs Inhalation Given 03/19/19 1009)  sodium chloride 0.9 % bolus 500 mL (0 mLs Intravenous Stopped 03/19/19 1314)    Mobility walks Moderate fall risk   Focused Assessments Cardiac Assessment Handoff:    Lab Results  Component Value Date   CKTOTAL 514 (H) 10/05/2011   CKMB 2.8 10/05/2011   TROPONINI <0.03 03/19/2019   Lab Results  Component Value Date   DDIMER 0.27 08/08/2018   Does the Patient currently have chest pain? No  , Pulmonary Assessment Handoff:  Lung sounds: L Breath Sounds: Expiratory wheezes R Breath Sounds: Expiratory wheezes O2 Device: Room Air        R Recommendations: See Admitting Provider Note  Report given to: Katie, RN  Additional Notes:  COPD exacerbation, admitted for same last month, SpO2 maintained above 95% consistently since arrival to ED - on 2-3L O2 for comfort, decreased work. Denies fevers. C/o R ribcage pain that is normal with exacerbation for her. Using bedside commode for toileting without assistance.

## 2019-03-19 NOTE — ED Triage Notes (Signed)
Pt in with sob, anxiety. Denies any cp, has cough, temp 98.6. hx of COPD and asthma, sats 97% on RA

## 2019-03-19 NOTE — Progress Notes (Signed)
Contacted IM resident to inquire about changing pt's admission status to Tele r/t pt having tele orders. Inquired about Droplet status. Order received to DC Droplet Isolation.

## 2019-03-19 NOTE — H&P (Addendum)
Date: 03/19/2019               Patient Name:  [name] MRN: [id number]  DOB: [birth year] Age / Sex: 68 y.o., female   PCP: Mosetta Anis, MD         Medical Service: Internal Medicine Teaching Service         Attending Physician: Dr. Rebeca Alert Raynaldo Opitz, MD    First Contact: Dr. Myrtie Hawk Pager: [phone]  Second Contact: Dr. Shan Levans Pager: [phone]       After Hours (After 5p/  First Contact Pager: [phone]  weekends / holidays): Second Contact Pager: [phone]   Chief Complaint: Shortness of breath  History of Present Illness: 68 year old woman with PMHx of COPD, DM II, CAD, HTN and anxiety, presented with productive cough and worsening of shortness of breath, .  She has had multiple hospitalization due to COPD exacerbation in the past year.  She was discharged from hospital about 3 weeks ago with similar symptoms, and treated with antibiotic, breathing treatment and prednisone for COPD exacerbation.  She finished her prednisone and antibiotic course after DC. She then started to have runny nose and noticed that she cannot smell anything.  She then developed cough with clear thick sputum as well as shortness of breath. She mentions that she used her albuterol but was not able to afford all of her inhalers until last week when her physician provide her.  She still smoke cigarettes but she cut back to half a pack per day.  She do not think that she is febrile but reports diaphoresis.  Also endorses sharp pain at her right lower ribs when taking deep breath or with coughing .  She mentions that she had history of broken ribs at the same area the past.  She denies any GI symptoms, no urinary symptoms. no lower extremity swelling or tenderness.  No headache or dizziness. Denies any sick contact.  Her daughter has history of allergy and had increased sneezing these days, otherwise asymptomatic.  Her husband who also lives with her has been asymptomatic.She denies working with chemicals.  Her  shortness of breath got worse simply and she came to emergency department for further evaluation.  She received Solu-Medrol IV magnesium sulfate and albuterol nebulizer in ED and needed continuous infusion service for further management.   Meds:  Current Meds  Medication Sig  . acetaminophen (TYLENOL) 500 MG tablet Take 1,000 mg by mouth every 6 (six) hours as needed for headache.  . albuterol (VENTOLIN HFA) 108 (90 Base) MCG/ACT inhaler Inhale 2 puffs into the lungs every 6 (six) hours as needed for wheezing or shortness of breath.  Marland Kitchen amLODipine (NORVASC) 10 MG tablet Take 1 tablet (10 mg total) by mouth daily.  . DULoxetine (CYMBALTA) 60 MG capsule Take 1 capsule (60 mg total) by mouth daily.  . Fluticasone-Salmeterol (ADVAIR) 250-50 MCG/DOSE AEPB Inhale 1 puff into the lungs 2 (two) times daily.  . hydrochlorothiazide (HYDRODIURIL) 25 MG tablet Take 1 tablet (25 mg total) by mouth daily.  Marland Kitchen ipratropium-albuterol (DUONEB) 0.5-2.5 (3) MG/3ML SOLN Take 3 mLs by nebulization every 6 (six) hours as needed.  Marland Kitchen losartan (COZAAR) 100 MG tablet Take 1 tablet (100 mg total) by mouth daily.  . metFORMIN (GLUCOPHAGE) 1000 MG tablet Take 1 tablet (1,000 mg total) by mouth 2 (two) times daily.  . metoprolol tartrate (LOPRESSOR) 25 MG tablet Take 1 tablet (25 mg total) by mouth 2 (two) times daily.  . simvastatin (  ZOCOR) 40 MG tablet Take 1 tablet (40 mg total) by mouth daily at 6 PM.     Allergies: Allergies as of 03/19/2019 - Review Complete 03/19/2019  Allergen Reaction Noted  . Ace inhibitors Swelling 12/26/2014  . Flonase [fluticasone] Other (See Comments) 12/25/2014   Past Medical History:  Diagnosis Date  . Anxiety   . Arthritis    "fingers, knees" (08/16/2018)  . Asthma   . Cancer of right breast ([place]) 1991   s/p lumpectomy, chemotherapy and radiation therapy in 1991. Mammogram in 2007 was normal.  . Constipated    h/o  . COPD (chronic obstructive pulmonary disease) ([place])    History  of multiple hospital admissions for exercabation   . COPD exacerbation ([place]) 01/01/2019  . COPD with acute exacerbation ([place]) 01/14/2019  . COPD with exacerbation ([place]) 04/06/2009   Qualifier: Diagnosis of  By: Eyvonne Mechanic MD, Vijay    . Depression   . Diarrhea    h/o  . GERD (gastroesophageal reflux disease)   . Headache    "a few times/month" (08/16/2018  . Heart murmur 10/05/11   "first time I ever heard I had one was today"  . Hyperlipidemia   . Hypertension   . Obesity   . Personal history of chemotherapy   . Personal history of radiation therapy   . Pneumonia    "couple times in the last 10-15 yrs" (08/16/2018)  . Shortness of breath 10/05/11   "at rest; lying down; w/exertion"  . Sigmoid diverticulitis 80/2008  . Tobacco abuse   . Type II diabetes mellitus ([facility])     Family History:  Family History  Problem Relation Age of Onset  . Cancer Mother    Social History:  She lives with her husband.  Reports smoking cigarettes half a pack per day.  Drinks alcohol occasionally.  She denies illicit drug use.  She works in a [employer]  Review of Systems: A complete ROS was negative except as per HPI.   Physical Exam: Blood pressure 126/79, pulse 85, temperature 97.9 F (36.6 C), temperature source Oral, resp. rate (!) 22, height 5\' 6"  (1.676 m), weight 90.7 kg, SpO2 98 %. Physical Exam Constitutional:      Appearance: She is well-developed.  HENT:     Head: Normocephalic and atraumatic.  Eyes:     Extraocular Movements: Extraocular movements intact.     Pupils: Pupils are equal, round, and reactive to light.  Cardiovascular:     Rate and Rhythm: Tachycardia present.     Heart sounds: No murmur.  Pulmonary:     Effort: Tachypnea present.     Breath sounds: Wheezing present. No rhonchi or rales.  Chest:     Chest wall: Tenderness present.     Comments: Has tenderness at right lower chest on her rib cage. Musculoskeletal:     Right lower leg: No edema.     Left lower  leg: No edema.  Skin:    General: Skin is warm and dry.  Neurological:     General: No focal deficit present.     Mental Status: She is alert and oriented to person, place, and time.  Psychiatric:     Comments: She gets tearful when she talks and daughter who has allergy.    Initial troponin less than 0.03  lactic acid elevated at 2.9-->1.9 WBC normal at 5.2 CMP normal   EKG: personally reviewed my interpretation is has artifact.  Sinus tach versus MAT.  Has poor R progression.  No significant acute ST-T changes. CXR: personally reviewed my interpretation is no acute cardiopulmonary disease. Assessment & Plan by Problem: Active Problems:   COPD exacerbation ([place])   68 year old woman with PMHx of COPD, current smoker, DM II, and HTN admitted for recurrent versus persistent COPD exacerbation.  COPD exacerbation:  Gold Stage II COPD With history of of recurrent COPD exacerbation. (4-5 times last year) last admission 3-4 weeks ago.  With history of influenza a 2 admissions ago. Recurrent exacerbation likely in setting of smoking also, seems to be undertreated due to noncompliance (because of financial barriers) No evidence of bacterial pneumonia. Hold off of AB at this point.  Normal leukocyte and lymphocyte ratio.   Having respiratory symptoms, anosmia, cough. COVID-19 was possible. How ever less likely as I and CRP normal at 1. Initial COVID-19 test was negative.   -DuoNeb nebulizer every 6 hours -Dulera 2 puffs twice daily -Prednisone 40 mg QD x 5 days -Follow-up blood culture   Costochondritis: -Voltaren gel twice daily as needed -Percocet 5-325 mg q8h PRN  -Ketorolac q 8h -K pad -Cardiac monitoring -Continuous pulse ox -RVP---> negative   DM II: She is on Metformin 1000 mg BID at home. -Moderate SSI -CBG monitoring -BMP daily  HTN: CAD: -Continue home amlodipine 10 mg daily -HCTZ 25 mg daily -Losartan 100 mg daily -Metoprolol tartrate 25 mg twice daily  -Monitor vital sign  Hyperlipidemia: Continue home simvastatin 40 mg daily   Anxiety: -Continue home duloxetine 60 mg daily   Diet: Carb modified IV fluid: None VTE ppx: Lovenox Code status: Full  Dispo: Admit patient to Observation with expected length of stay less than 2 midnights.  SignedDewayne Hatch, MD 03/19/2019, 5:25 PM  Pager: [phone]

## 2019-03-20 DIAGNOSIS — I10 Essential (primary) hypertension: Secondary | ICD-10-CM

## 2019-03-20 DIAGNOSIS — Z7951 Long term (current) use of inhaled steroids: Secondary | ICD-10-CM

## 2019-03-20 DIAGNOSIS — E119 Type 2 diabetes mellitus without complications: Secondary | ICD-10-CM | POA: Diagnosis not present

## 2019-03-20 DIAGNOSIS — J309 Allergic rhinitis, unspecified: Secondary | ICD-10-CM | POA: Diagnosis present

## 2019-03-20 DIAGNOSIS — E876 Hypokalemia: Secondary | ICD-10-CM | POA: Diagnosis present

## 2019-03-20 DIAGNOSIS — I2510 Atherosclerotic heart disease of native coronary artery without angina pectoris: Secondary | ICD-10-CM

## 2019-03-20 DIAGNOSIS — M170 Bilateral primary osteoarthritis of knee: Secondary | ICD-10-CM | POA: Diagnosis present

## 2019-03-20 DIAGNOSIS — Z720 Tobacco use: Secondary | ICD-10-CM | POA: Diagnosis not present

## 2019-03-20 DIAGNOSIS — R011 Cardiac murmur, unspecified: Secondary | ICD-10-CM | POA: Diagnosis present

## 2019-03-20 DIAGNOSIS — Z91120 Patient's intentional underdosing of medication regimen due to financial hardship: Secondary | ICD-10-CM

## 2019-03-20 DIAGNOSIS — M19041 Primary osteoarthritis, right hand: Secondary | ICD-10-CM | POA: Diagnosis present

## 2019-03-20 DIAGNOSIS — M940 Chondrocostal junction syndrome [Tietze]: Secondary | ICD-10-CM

## 2019-03-20 DIAGNOSIS — Z853 Personal history of malignant neoplasm of breast: Secondary | ICD-10-CM | POA: Diagnosis not present

## 2019-03-20 DIAGNOSIS — J441 Chronic obstructive pulmonary disease with (acute) exacerbation: Secondary | ICD-10-CM | POA: Diagnosis not present

## 2019-03-20 DIAGNOSIS — E669 Obesity, unspecified: Secondary | ICD-10-CM | POA: Diagnosis present

## 2019-03-20 DIAGNOSIS — E785 Hyperlipidemia, unspecified: Secondary | ICD-10-CM

## 2019-03-20 DIAGNOSIS — K219 Gastro-esophageal reflux disease without esophagitis: Secondary | ICD-10-CM | POA: Diagnosis present

## 2019-03-20 DIAGNOSIS — F419 Anxiety disorder, unspecified: Secondary | ICD-10-CM | POA: Diagnosis not present

## 2019-03-20 DIAGNOSIS — Z90710 Acquired absence of both cervix and uterus: Secondary | ICD-10-CM | POA: Diagnosis not present

## 2019-03-20 DIAGNOSIS — Z7984 Long term (current) use of oral hypoglycemic drugs: Secondary | ICD-10-CM

## 2019-03-20 DIAGNOSIS — Z888 Allergy status to other drugs, medicaments and biological substances status: Secondary | ICD-10-CM | POA: Diagnosis not present

## 2019-03-20 DIAGNOSIS — M19042 Primary osteoarthritis, left hand: Secondary | ICD-10-CM | POA: Diagnosis present

## 2019-03-20 DIAGNOSIS — Z8709 Personal history of other diseases of the respiratory system: Secondary | ICD-10-CM | POA: Diagnosis not present

## 2019-03-20 DIAGNOSIS — Z79899 Other long term (current) drug therapy: Secondary | ICD-10-CM | POA: Diagnosis not present

## 2019-03-20 DIAGNOSIS — F17210 Nicotine dependence, cigarettes, uncomplicated: Secondary | ICD-10-CM

## 2019-03-20 DIAGNOSIS — Z8781 Personal history of (healed) traumatic fracture: Secondary | ICD-10-CM | POA: Diagnosis not present

## 2019-03-20 DIAGNOSIS — Z9221 Personal history of antineoplastic chemotherapy: Secondary | ICD-10-CM | POA: Diagnosis not present

## 2019-03-20 DIAGNOSIS — Z6832 Body mass index (BMI) 32.0-32.9, adult: Secondary | ICD-10-CM | POA: Diagnosis not present

## 2019-03-20 DIAGNOSIS — F329 Major depressive disorder, single episode, unspecified: Secondary | ICD-10-CM | POA: Diagnosis present

## 2019-03-20 DIAGNOSIS — R0602 Shortness of breath: Secondary | ICD-10-CM | POA: Diagnosis not present

## 2019-03-20 DIAGNOSIS — Z9119 Patient's noncompliance with other medical treatment and regimen: Secondary | ICD-10-CM | POA: Diagnosis not present

## 2019-03-20 DIAGNOSIS — Z1159 Encounter for screening for other viral diseases: Secondary | ICD-10-CM | POA: Diagnosis not present

## 2019-03-20 DIAGNOSIS — Z923 Personal history of irradiation: Secondary | ICD-10-CM | POA: Diagnosis not present

## 2019-03-20 LAB — GLUCOSE, CAPILLARY
Glucose-Capillary: 180 mg/dL — ABNORMAL HIGH (ref 70–99)
Glucose-Capillary: 166 mg/dL — ABNORMAL HIGH (ref 70–99)
Glucose-Capillary: 123 mg/dL — ABNORMAL HIGH (ref 70–99)
Glucose-Capillary: 201 mg/dL — ABNORMAL HIGH (ref 70–99)
Glucose-Capillary: 226 mg/dL — ABNORMAL HIGH (ref 70–99)

## 2019-03-20 MED ORDER — ALBUTEROL SULFATE (2.5 MG/3ML) 0.083% IN NEBU
2.5000 mg | INHALATION_SOLUTION | RESPIRATORY_TRACT | Status: DC | PRN
  Administered 2019-03-22: 2.5 mg via RESPIRATORY_TRACT
  Filled 2019-03-20: qty 3

## 2019-03-20 MED ORDER — IPRATROPIUM-ALBUTEROL 0.5-2.5 (3) MG/3ML IN SOLN
3.0000 mL | Freq: Three times a day (TID) | RESPIRATORY_TRACT | Status: DC
  Administered 2019-03-20 – 2019-03-21 (×3): 3 mL via RESPIRATORY_TRACT
  Filled 2019-03-20 (×3): qty 3

## 2019-03-20 MED ORDER — KETOROLAC TROMETHAMINE 30 MG/ML IJ SOLN: 30.0000 mg | Freq: Three times a day (TID) | INTRAMUSCULAR | Status: DC | PRN

## 2019-03-20 MED ORDER — LORATADINE 10 MG PO TABS
10.0000 mg | ORAL_TABLET | Freq: Every day | ORAL | Status: DC
  Administered 2019-03-20 – 2019-03-22 (×3): 10 mg via ORAL
  Filled 2019-03-20 (×3): qty 1

## 2019-03-20 MED ORDER — KETOROLAC TROMETHAMINE 30 MG/ML IJ SOLN
30.0000 mg | Freq: Three times a day (TID) | INTRAMUSCULAR | Status: AC | PRN
  Administered 2019-03-20 – 2019-03-22 (×5): 30 mg via INTRAVENOUS
  Filled 2019-03-20 (×6): qty 1

## 2019-03-20 NOTE — Progress Notes (Signed)
  Date: 03/20/2019  Patient name: [name]  Medical record number: [id number]  Date of birth: [birth year]   I have seen and evaluated this patient and I have discussed the plan of care with the house staff. Please see their note for complete details. I concur with their findings with the following additions/corrections:   Please see my separate attestation of the H&P from 03/19/2019  Lenice Pressman, M.D., Ph.D. 03/20/2019, 3:54 PM

## 2019-03-20 NOTE — Progress Notes (Signed)
   Subjective: Patient was seen and evaluated at bedside on morning rounds. No acute events overnight. She feels better. She does not have shortness of breath any more. Still coughs some times that causes her sever sharp pain at her ribs. No other acute complaints.   Objective:  Vital signs in last 24 hours: Vitals:   03/19/19 2127 03/19/19 2354 03/20/19 0100 03/20/19 0212  BP: 127/63 120/63    Pulse: (!) 104 96 95 95  Resp:   20 (!) 22  Temp: 97.9 F (36.6 C) (!) 97.4 F (36.3 C)    TempSrc: Oral Oral    SpO2: 94% 100% 98% 98%  Weight:      Height:       Physical Exam:  VS reviewed, nursing notes reviewed. General: Appears comfortable, normocephalic, atraumatic CV: RRR, nl S1S2, no murmur Pulm: No respiratory distress, has diffuse wheezing, no crackle Abdomen: Soft and non tender to palpation, BS are normal Musculoskeletal: No LEE, Pulses are normal, has tenderness at her right lowe rib Neurologic exam: A & O x 3  Skin: Warm and dry  Psychiatric exam: Mood and affect are normal  Assessment/Plan:  Active Problems:   COPD exacerbation ([place])  68 year old woman withPMHxof COPD, current smoker, DM II, and HTN admittedforrecurrent versus persistent COPD exacerbation.  COPD exacerbation:  Gold Stage II COPD With history of of recurrent COPD exacerbation likely in setting of smoking +/- undertreatment and noncompliance (because of financial barriers)  No evidence of bacterial PNA. Do not give AB at this point. Seems to be viral, but RVP and COVID negative. Can be other viral pathogen.  Clinicaly improved today. Will continue current home treatment with SABA, Corticosteroid inhaalor, LABA, anticholinergic and alos give PO Prednison.  -Continue DuoNeb nebulizer every 6 hours -Continue Dulera 2 puffs twice daily -Continue Prednisone 40 mg QD x 5 days -Follow-up blood culture -Cardiac monitoring -Continuous pulse ox  Costochondritis: -Voltaren gel twice daily as  needed -Percocet 5-325 mg q8h PRN  -Ketorolac q 8h PRN -K pad   DM II:She is on Metformin 1000 mg BID at home. -Moderate SSI -CBG monitoring -BMP daily  HTN: Normotensive on home meds CAD: Asymptomatic -Continue home amlodipine 10 mg daily - Continue HCTZ 25 mg daily -Continue Losartan 100 mg daily -Continue Metoprolol tartrate 25 mg twice daily -Monitor vital sign  Hyperlipidemia: Continue home simvastatin 40 mg daily   Anxiety: -Continue home duloxetine 60 mg daily   Dispo: Anticipated discharge in approximately 1-2 days  [name], MD 03/20/2019, 4:39 AM Pager: [phone]

## 2019-03-21 DIAGNOSIS — Z720 Tobacco use: Secondary | ICD-10-CM

## 2019-03-21 LAB — COMPREHENSIVE METABOLIC PANEL
ALT: 10 U/L (ref 0–44)
AST: 14 U/L — ABNORMAL LOW (ref 15–41)
Albumin: 3.3 g/dL — ABNORMAL LOW (ref 3.5–5.0)
Alkaline Phosphatase: 64 U/L (ref 38–126)
Anion gap: 12 (ref 5–15)
BUN: 9 mg/dL (ref 8–23)
CO2: 27 mmol/L (ref 22–32)
Calcium: 8.5 mg/dL — ABNORMAL LOW (ref 8.9–10.3)
Chloride: 96 mmol/L — ABNORMAL LOW (ref 98–111)
Creatinine, Ser: 0.84 mg/dL (ref 0.44–1.00)
GFR calc Af Amer: >60 mL/min (ref >60)
GFR calc non Af Amer: >60 mL/min (ref >60)
Glucose, Bld: 102 mg/dL — ABNORMAL HIGH (ref 70–99)
Potassium: 3.4 mmol/L — ABNORMAL LOW (ref 3.5–5.1)
Sodium: 135 mmol/L (ref 135–145)
Total Bilirubin: 0.3 mg/dL (ref 0.3–1.2)
Total Protein: 6.3 g/dL — ABNORMAL LOW (ref 6.5–8.1)

## 2019-03-21 LAB — CBC WITH DIFFERENTIAL/PLATELET
Abs Immature Granulocytes: 0.08 10*3/uL — ABNORMAL HIGH (ref 0.00–0.07)
Basophils Absolute: 0 10*3/uL (ref 0.0–0.1)
Basophils Relative: 0 %
Eosinophils Absolute: 0 10*3/uL (ref 0.0–0.5)
Eosinophils Relative: 0 %
HCT: 35.7 % — ABNORMAL LOW (ref 36.0–46.0)
Hemoglobin: 12.6 g/dL (ref 12.0–15.0)
Immature Granulocytes: 1 %
Lymphocytes Relative: 26 %
Lymphs Abs: 2.4 10*3/uL (ref 0.7–4.0)
MCH: 31.6 pg (ref 26.0–34.0)
MCHC: 35.3 g/dL (ref 30.0–36.0)
MCV: 89.5 fL (ref 80.0–100.0)
Monocytes Absolute: 0.6 10*3/uL (ref 0.1–1.0)
Monocytes Relative: 6 %
Neutro Abs: 6.3 10*3/uL (ref 1.7–7.7)
Neutrophils Relative %: 67 %
Platelets: 276 10*3/uL (ref 150–400)
RBC: 3.99 MIL/uL (ref 3.87–5.11)
RDW: 12.1 % (ref 11.5–15.5)
WBC: 9.4 10*3/uL (ref 4.0–10.5)
nRBC: 0 % (ref 0.0–0.2)

## 2019-03-21 LAB — GLUCOSE, CAPILLARY
Glucose-Capillary: 136 mg/dL — ABNORMAL HIGH (ref 70–99)
Glucose-Capillary: 145 mg/dL — ABNORMAL HIGH (ref 70–99)
Glucose-Capillary: 145 mg/dL — ABNORMAL HIGH (ref 70–99)
Glucose-Capillary: 224 mg/dL — ABNORMAL HIGH (ref 70–99)
Glucose-Capillary: 239 mg/dL — ABNORMAL HIGH (ref 70–99)
Glucose-Capillary: 300 mg/dL — ABNORMAL HIGH (ref 70–99)
Glucose-Capillary: 89 mg/dL (ref 70–99)

## 2019-03-21 MED ORDER — AZITHROMYCIN 250 MG PO TABS
500.0000 mg | ORAL_TABLET | Freq: Every day | ORAL | Status: AC
  Administered 2019-03-21: 500 mg via ORAL
  Filled 2019-03-21: qty 2

## 2019-03-21 MED ORDER — INSULIN ASPART 100 UNIT/ML ~~LOC~~ SOLN
0.0000 [IU] | Freq: Three times a day (TID) | SUBCUTANEOUS | Status: DC
  Administered 2019-03-21: 11 [IU] via SUBCUTANEOUS
  Administered 2019-03-21: 5 [IU] via SUBCUTANEOUS

## 2019-03-21 MED ORDER — DM-GUAIFENESIN ER 30-600 MG PO TB12
1.0000 | ORAL_TABLET | Freq: Two times a day (BID) | ORAL | Status: DC
  Administered 2019-03-21 – 2019-03-22 (×3): 1 via ORAL
  Filled 2019-03-21 (×3): qty 1

## 2019-03-21 MED ORDER — BENZONATATE 100 MG PO CAPS
100.0000 mg | ORAL_CAPSULE | Freq: Three times a day (TID) | ORAL | Status: DC | PRN
  Administered 2019-03-22: 100 mg via ORAL
  Filled 2019-03-21: qty 1

## 2019-03-21 MED ORDER — INSULIN ASPART 100 UNIT/ML ~~LOC~~ SOLN
0.0000 [IU] | SUBCUTANEOUS | Status: DC
  Administered 2019-03-22: 2 [IU] via SUBCUTANEOUS
  Administered 2019-03-22: 3 [IU] via SUBCUTANEOUS
  Administered 2019-03-22: 2 [IU] via SUBCUTANEOUS

## 2019-03-21 MED ORDER — IPRATROPIUM-ALBUTEROL 0.5-2.5 (3) MG/3ML IN SOLN
3.0000 mL | Freq: Two times a day (BID) | RESPIRATORY_TRACT | Status: DC
  Administered 2019-03-21 – 2019-03-22 (×2): 3 mL via RESPIRATORY_TRACT
  Filled 2019-03-21 (×2): qty 3

## 2019-03-21 MED ORDER — SENNOSIDES-DOCUSATE SODIUM 8.6-50 MG PO TABS
1.0000 | ORAL_TABLET | Freq: Every day | ORAL | Status: DC
  Filled 2019-03-21: qty 1

## 2019-03-21 MED ORDER — AZITHROMYCIN 250 MG PO TABS
250.0000 mg | ORAL_TABLET | Freq: Every day | ORAL | Status: DC
  Administered 2019-03-22: 250 mg via ORAL
  Filled 2019-03-21: qty 1

## 2019-03-21 MED ORDER — INSULIN ASPART 100 UNIT/ML ~~LOC~~ SOLN
7.0000 [IU] | Freq: Once | SUBCUTANEOUS | Status: AC
  Administered 2019-03-21: 7 [IU] via SUBCUTANEOUS

## 2019-03-21 NOTE — Progress Notes (Signed)
  Date: 03/21/2019  Patient name: [name]  Medical record number: [id number]  Date of birth: 20-Feb-1951   I have seen and evaluated this patient and I have discussed the plan of care with the house staff. Please see their note for complete details. I concur with their findings with the following additions/corrections:   On rounds this morning, she was very upset after requiring multiple blood draws for her labs. She was breathing heavily with stridor when worked up, but more normally once calm. Exam was deferred until this afternoon.  When we reevaluated her this afternoon, she was again upset because she was unintentionally administered a larger dose of insulin than called for by her sliding scale. She said she felt like we were "ganging up" on her and "hiding something" and she felt "all alone". In an extended conversation, I validated her feelings and apologized for the error. I explained that her glucose has remained in a safe range since then (136, 145, 224) and we were checking frequently to ensure she did not have any negative consequences. She requested her husband be allowed to join her, but I explained that is not possible right now due to infection prevention measures. I raised the possibility of transfer or discharge to another facility if she does not trust our care, but she said she trusts our team.   [facility] indicates 11 units of insulin aspart given this morning at 0913. Given short acting nature of the insulin, she should not have any effects going forward.   Breathing is worse again, partially related to distress. Lungs have coarse rhonchi throughout with prolonged expiratory phase, frequent coughing fits productive of thick phlegm, right ribs exquisitely tender.  Given her worsened respiratory status today, not yet ready for discharge. As she has increased phlegm production, will add azithromycin. Hopeful for discharge tomorrow.   Lenice Pressman, M.D., Ph.D.  03/21/2019, 11:42 AM

## 2019-03-21 NOTE — Progress Notes (Addendum)
   Subjective: Patient was seen and evaluated at bedside on morning rounds. Mentions that she does not feel good this morning, because multiple tries for blood draw this morning despite her wishes. She gets anxcious and tearful.  She coughs more and have pain on her ribs when we are in the room.  Objective:  Vital signs in last 24 hours: Vitals:   03/20/19 1622 03/20/19 2020 03/20/19 2039 03/21/19 0030  BP: 136/78 123/64  138/65  Pulse: 85 82  87  Resp: 19     Temp: 97.6 F (36.4 C) 98.1 F (36.7 C)  98 F (36.7 C)  TempSrc: Oral Oral  Oral  SpO2: 100% 96% 99% 100%  Weight:      Height:       Physical Exam: VS reviewed, nursing notes reviewed. General: Appears anxious and panic, normocephalic, atraumatic CV: RRR, nl S1S2, no murmur Pulm: Stridor on inspiration, shows extra work of breathing with anxiety Neurologic exam: A & O x 3  Psychiatric exam: Looks anxious and stressed  Assessment/Plan:  Principal Problem:   COPD exacerbation ([place]) Active Problems:   Type 2 diabetes mellitus (HCC)   Hypokalemia   Allergic rhinitis  68 year old woman withPMHxof COPD,current smoker,DM II, and HTN admittedforrecurrent versus persistent COPD exacerbation.  COPD exacerbation: Gold Stage II COPD With history of of recurrent COPD exacerbation likely in setting of smoking +/- undertreatment and noncomplianceto inhalors(because of financial barriers)  RVP and COVID negative. Initial CXR was with no evidence of PNA. BC negative so far. She was treated with bronchodilators and Prednisone so far. She has some productive cough today. Adding Azithromycin and cough suppressant.  She in anxious and stressed today and has some increased work of breathing with some inspiratory stridor when she get tearful and talking. No worsening of wheezing.  She is saturating well. Part of her symptoms seems to be worsened in setting of her anxiety and panic symptoms. Will try to control her anxiety  meanwhile.  -Azithromycin 500 mg once today then 250 mg QD x 4 days -Tessalon 100 mg TID PRN -Mucinex 30-600 BID  -Continue DuoNeb nebulizer every 6 hours -Continue Dulera 2 puffs twice daily -Continue Prednisone 40 mg QD x 5 days -Continuous pulse ox, supplemental O2 if sat <90% -Continue ketorolac and Percocet for costochondritis  -Follow-up blood culture -Cardiac monitoring  Anxiety: Seems to contribute to part of her respiratory presentations today.  -Continue home duloxetine 60 mg daily. May consider one dose of BZD if remains anxious or having panic symptoms.  Costochondritis: -Voltaren gel twice daily as needed -Percocet 5-325 mgq8h PRN  -Ketorolac q 8h PRN -K pad   DM II:She is on Metformin 1000 mg BID at home. -ModerateSSI -Monitor CBG when on Prednison -BMP daily  ADDENDUM: paged by RN about she might have given her extra dosage of Insulin by mistake after she read the CBG number incorrectly. Repeated CBG 2h post Insulin is 136 which is ok. Recommended to monitor her CBG and for any hypoglycemic symptoms.   HTN: Normotensive on home meds CAD: Asymptomatic -Continue home amlodipine 10 mg daily - Continue HCTZ 25 mg daily -Continue Losartan 100 mg daily -Continue Metoprolol tartrate 25 mg twice daily -Monitor vital sign  Hyperlipidemia: Continue home simvastatin 40 mg daily   Dispo: Anticipated discharge in approximately 1-2 todays  [name], MD 03/21/2019, 7:27 AM Pager: [phone]

## 2019-03-22 ENCOUNTER — Telehealth: Payer: Self-pay

## 2019-03-22 LAB — GLUCOSE, CAPILLARY
Glucose-Capillary: 143 mg/dL — ABNORMAL HIGH (ref 70–99)
Glucose-Capillary: 86 mg/dL (ref 70–99)
Glucose-Capillary: 175 mg/dL — ABNORMAL HIGH (ref 70–99)

## 2019-03-22 MED ORDER — BENZONATATE 100 MG PO CAPS: 100.0000 mg | ORAL_CAPSULE | Freq: Three times a day (TID) | ORAL | 0 refills | Status: DC | PRN

## 2019-03-22 MED ORDER — OXYCODONE-ACETAMINOPHEN 5-325 MG PO TABS: 1.0000 | ORAL_TABLET | Freq: Two times a day (BID) | ORAL | 0 refills | Status: AC | PRN

## 2019-03-22 MED ORDER — AZITHROMYCIN 250 MG PO TABS: ORAL_TABLET | ORAL | 0 refills | Status: AC

## 2019-03-22 MED ORDER — IBUPROFEN 800 MG PO TABS: 800.0000 mg | ORAL_TABLET | Freq: Three times a day (TID) | ORAL | 0 refills | Status: DC | PRN

## 2019-03-22 MED ORDER — PREDNISONE 20 MG PO TABS: ORAL_TABLET | ORAL | 0 refills | Status: AC

## 2019-03-22 MED ORDER — FLUTICASONE PROPIONATE 50 MCG/ACT NA SUSP: 1.0000 | Freq: Every day | NASAL | 2 refills | Status: DC | PRN

## 2019-03-22 MED ORDER — DM-GUAIFENESIN ER 30-600 MG PO TB12: 1.0000 | ORAL_TABLET | Freq: Two times a day (BID) | ORAL | 0 refills | Status: DC

## 2019-03-22 MED FILL — AZITHROMYCIN 250 MG TABLET: 250 | 3 days supply | Qty: 3 | Fill #0

## 2019-03-22 MED FILL — BENZONATATE 100 MG CAP: 100 | 7 days supply | Qty: 20 | Fill #0

## 2019-03-22 MED FILL — predniSONE 20 MG TABS: 20 | 10 days supply | Qty: 13 | Fill #0

## 2019-03-22 MED FILL — FLUTICASONE PROP 50 MCG SPR: 50 | 30 days supply | Qty: 16 | Fill #0

## 2019-03-22 MED FILL — MUCUS RELIEF DM 30-600 MG T: 30-600 | 2 days supply | Qty: 5 | Fill #0

## 2019-03-22 MED FILL — IBUPROFEN 800 MG TAB: 800 | 5 days supply | Qty: 15 | Fill #0

## 2019-03-22 MED FILL — OXYCODONE W/APAP 5/325 TAB: 5-325 | 3 days supply | Qty: 10 | Fill #0

## 2019-03-22 NOTE — Consult Note (Addendum)
[facility] CM Inpatient Consult   03/22/2019  BERMA WOJTON [birth year] [id number]  Unplanned readmission: high score of 24%/30 day readmission  Patient is currently active with [facility] Care Management for chronic disease management services for COPD and has been outreached from an EMMI follow up call.  Patient has been engaged by a [facility] Our community based plan of care has focused on disease management and community resource support. however, patient did not feel she needed additional follow up at that time last week.  Chart review reveals and is as follows from MD history and physical progress note 03/19/2019:  this is a 68 year old woman admitted with another episode of COPD exacerbation.  Based on preceding nasal congestion, anosmia, and altered taste, suspect either viral or allergic etiology to her COPD exacerbation.  Continued smoking is certainly not helping.  Reassuring that SARS-CoV-2 testing is negative, she also does not have other labs suggestive of COVID-19. Follow up with inpatient [facility] RNCM regarding post hospital [facility] follow up.  Spoke with patient via telephone is room.  She is in agreement for continued post hospital follow up.  She endorses the [facility] as her primary care providers.  This office is listed to provide the transition of care follow up.  Patient will be followed for ongoing COPD chronic disease management. Patient is in the Medicare NGACO.  She states "things has been tough with my husband losing both of his jobs due to this thing [COVID], I would like to hear more about food resources that we might can have delivered."    Patient will receive a post hospital call and will be evaluated for assessments, resources, and disease process education.  Inpatient [facility] team member was made aware that [facility] Care Management following.  Of note, [facility] Care Management services does not replace or interfere with any services that are needed or arranged by  inpatient TOC case management or social work.    For additional questions or referrals please contact:  Charlesetta Shanks, RN BSN CCM [facility]  [phone] business mobile phone Toll free office [phone]  Fax number: [phone] [email or web address]

## 2019-03-22 NOTE — Progress Notes (Signed)
  Subjective: Patient feels better today. Her cough and rib pain are some how better today. She mentions that she is not completely better but she feels that she is ready to go home today. She asks about continuing antibiotic and prednisone after discharge. All of her questions and concerns were addressed. Dr. Rebeca Alert talked to her pulmonologist, Dr. Joya Gaskins yesterday and he is happy to be her PCP. Ms. Hilario would like to be seen at [facility] clinic for primary care and follow up with Dr. Joya Gaskins as her pulmonologist. She agrees that Dr. Rebeca Alert call Dr. Joya Gaskins and discuss treatment plan for after discharge. She mentions that she is working on cutting down on smoking cigarettes.  We reviewed her home meds and what she will need to be refilled.  Objective:  Vital signs in last 24 hours: Vitals:   03/21/19 2359 03/22/19 0412 03/22/19 0816 03/22/19 0821  BP: 135/60   (!) 148/84  Pulse: 79  65 81  Resp: 18  16 20   Temp: 98.5 F (36.9 C)   97.8 F (36.6 C)  TempSrc: Oral   Oral  SpO2: 100% 98% 95% 100%  Weight:      Height:       Physical Exam: VS reviewed, nursing notes reviewed. General:Appears anxious and panic, normocephalic, atraumatic CV:RRR, nl S1S2, no murmur Pulm:Normal work of breathing, no stridor, has mild diffuse wheezing (improved today) Neurologic exam:A & O x 3 Psychiatric exam:Has normal mood today.   Assessment/Plan:  Principal Problem:   COPD exacerbation (HCC) Active Problems:   Type 2 diabetes mellitus (HCC)   Hypokalemia   Allergic rhinitis  68 year old woman withPMHxof COPD,current smoker,DM II, and HTN admittedforrecurrent versus persistent COPD exacerbation.  COPD exacerbation:Her respiratory symptoms and exam significantly improved today. She is also more clam today that also helps with her breathing. Today is second day of Azithromycin, and Day 3 of Prednisone (plus one day Solumedrol she got at ED)   -DC home today.  -Continue Azithromycin, to  finish 5 day course after Dc -Continue Mucinex 30-600 BID for 2 days after DC -Will talk to Dr. Joya Gaskins about his recommendation for her about duration of Prednison -Continue home inhalers and nebulizer after DC -Advised to quit smoking  Anxiety: Better today. She mentions that she has been on other psychiatric medications before that have been switched frequently. She will follow up with her PCP.  -Continue home duloxetine 60 mg daily.   Costochondritis: -Will prescribe Ibuprofen 800 mg Q8h PRN and few Oxycodone for pain at DC  DM II:She is on Metformin 1000 mg BID at home. No hypoglycemia after she got higher dose of Insluine (11u) by mistake yesterday. She actually had elevated BG at 300 yesterday and received 7 unit of Novologe. Tomorrow will be her last dose of Prednisone, and less concern for hyperglycemia at home, she can continue home dose of Metformin after DCbut advise to follow up with PCP.  -ModerateSSI  -f/u with PCP for glucose monitoring  IZT:IWPYKD hypertensive at 148/88 today XIP:[id number]  -Continue home amlodipine 10 mg daily -ContinueHCTZ 25 mg daily -ContinueLosartan 100 mg daily -ContinueMetoprolol tartrate 25 mg twice daily -Monitor vital sign  Hyperlipidemia: Continue home simvastatin 40 mg daily   Dispo: Anticipated discharge today.   [name], MD 03/22/2019, 10:18 AM Pager: [phone]

## 2019-03-22 NOTE — Telephone Encounter (Signed)
Hospital TOC per [place] with [facility] care management, discharge 03/22/2019, in person appt 03/29/2019.

## 2019-03-22 NOTE — TOC Transition Note (Signed)
Transition of Care [facility]) - CM/SW Discharge Note   Patient Details  Name: [name] MRN: [id number] Date of Birth: 1951-07-24  Transition of Care [facility]) CM/SW Contact:  Zenon Mayo, RN Phone Number: 03/22/2019, 10:45 AM   Clinical Narrative:    From home with spouse, pta indep, copd ex, covid negative, she has transport at dc, and she states she will see [facility] for pulmonary and they will give her sample inhalers.  She is for dc today. She is active with [facility] and  would like to continue with [facility] RN, NCM informed [place] with [facility] of this information.    Final next level of care: Home/Self Care Barriers to Discharge: No Barriers Identified   Patient Goals and CMS Choice Patient states their goals for this hospitalization and ongoing recovery are:: to get back to her normal routine, take it easy and stay healthy   Choice offered to / list presented to : NA  Discharge Placement                       Discharge Plan and Services In-house Referral: [facility] Discharge Planning Services: CM Consult, Follow-up appt scheduled Post Acute Care Choice: NA          DME Arranged: N/A DME Agency: NA       HH Arranged: NA HH Agency: NA        Social Determinants of Health (SDOH) Interventions     Readmission Risk Interventions Readmission Risk Prevention Plan 02/19/2019  Transportation Screening Complete  PCP or Specialist Appt within 5-7 Days Complete  Home Care Screening Not Complete  Home Care Screening Not Completed Comments NA  Medication Review (RN CM) Complete  Some recent data might be hidden

## 2019-03-22 NOTE — Progress Notes (Signed)
Inpatient Diabetes Program Recommendations  AACE/ADA: New Consensus Statement on Inpatient Glycemic Control   Target Ranges:  Prepandial:   less than 140 mg/dL      Peak postprandial:   less than 180 mg/dL (1-2 hours)      Critically ill patients:  140 - 180 mg/dL   Results for DYNASTY, HOLQUIN (MRN [id number]) as of 03/22/2019 10:14  Ref. Range 03/21/2019 07:39 03/21/2019 10:47 03/21/2019 11:47 03/21/2019 14:07 03/21/2019 17:12 03/21/2019 21:42 03/21/2019 23:53 03/22/2019 03:32 03/22/2019 07:24  Glucose-Capillary Latest Ref Range: 70 - 99 mg/dL 89 136 (H) 145 (H) 224 (H) 239 (H) 300 (H) 145 (H) 143 (H) 86   Review of Glycemic Control  Current orders for Inpatient glycemic control: Novolog 0-15 units Q4H; Prednisone 40 mg QAM  Inpatient Diabetes Program Recommendations:   Insulin - Meal Coverage: If steroids are continued, please consider ordering Novolog 3 units TID with meals for meal coverage if patient eats at least 50% of meals.  Thanks, Barnie Alderman, RN, MSN, CDE Diabetes Coordinator Inpatient Diabetes Program [phone] (Team Pager from 8am to 5pm)

## 2019-03-23 ENCOUNTER — Other Ambulatory Visit: Payer: Self-pay | Admitting: *Deleted

## 2019-03-23 ENCOUNTER — Telehealth: Payer: Self-pay | Admitting: Pharmacist

## 2019-03-23 DIAGNOSIS — J449 Chronic obstructive pulmonary disease, unspecified: Secondary | ICD-10-CM

## 2019-03-23 DIAGNOSIS — Z720 Tobacco use: Secondary | ICD-10-CM

## 2019-03-23 DIAGNOSIS — E118 Type 2 diabetes mellitus with unspecified complications: Secondary | ICD-10-CM

## 2019-03-23 NOTE — Patient Outreach (Signed)
[facility]) Care Management  03/23/2019  [name] [birth year] [id number]    Referral received : 03/22/2019 Initial outreach: 03/23/2019 Discharge Date from [facility]: 03/22/2019  PROVIDER OFFICE TO COMPLETED TRANSITION OF CARE   RN spoke with the pt and verified identifiers. RN explained the purpose for today's call and inquired if this was a good time to discussed her possible needs. Pt receptive as pt discussed how she is managing her ongoing care with COPD. RN offered to review all medications however pt declined indicating she is taking all her medication with no problems. RN further inquired on her knowledge of COPD however pt has not heard of the COPD action plan. RN discussed and educated pt on the action plan and verified pt is currently in the GREEN zone on the action plan. RN explained in detail the GREEN/YELLOW/RED and when to contact her provider with any acute symptoms (pt verbalized an understanding). RN further offered ongoing follow up with enrollment into the COPD program as a part of her benefits with her insurance carrier. Pt appreciative but opt to decline at this time. Pt however receptive to additional education material as RN will send [facility] packet, EMMI related to COPD and a [facility] calendar for pt to use in managing her ongoing care. RN also discussed quarterly contact if preferred for her management of care with [facility]. Again pt appreciative but opt to decline at this time. Pt has indicated she will contact [facility] if she is in need of services in the future. RN informed the pt that her provider will be notified of her disposition with [facility] as she has opt to decline services at this time. RN inquired is she has been contacted by the Education officer, museum with [facility] and pt has indicated she has and opt to decline the initial request for food resources. RN will update the team on community case management services with this RN. No further request or services requested at this time.  Primary will be updated accordingly. Case closed for this discipline.   [name], RN Care Management Coordinator [place] Office [phone]

## 2019-03-23 NOTE — Patient Outreach (Signed)
[facility]) Care Management  03/23/2019  [name] 1950/12/21 [id number]   CSW had received referral from [facility], [place] for food resources and meal delivery. CSW called & spoke with patient to provide information on [facility], patient states that she is already familiar with them and has their phone number programmed in her phone and plans to call them. Patient declined further resources at this time. CSW encouraged patient to call back if any further needs came up. CSW will close case at this time.    Raynaldo Opitz, LCSW [facility]  Clinical Social Worker cell #: [phone]

## 2019-03-23 NOTE — Progress Notes (Addendum)
Transition Care Management Follow-up Telephone Call  Date discharged? 03/22/2019  How have you been since you were released from the hospital? Feeling better   Do you understand why you were in the hospital? yes  Do you understand the discharge instructions? yes  Where were you discharged to? home  Items Reviewed:  Medications reviewed: yes  Allergies reviewed: yes  Dietary changes reviewed: no  Referrals reviewed: yes  MEDICATION ASSESSMENT: Date Medication Reconciliation Performed: 03/23/2019 Patient was recently discharged from hospital and all medications have been reviewed. Medications:  New at Discharge: . None Adjustments at Discharge: . Advair Discontinued at Discharge:   None   Functional Questionnaire:   Activities of Daily Living (ADLs):   She states they are independent in the following: all ADLs States they require assistance with the following: none   Any transportation issues/concerns?: no   Any patient concerns? Yes, concerns with inhaler affordability, she states she will bring financial paperwork to clinic next week to apply for patient assistance applications. Also provided brief counseling for smoking cessation. Patient is amenable to working with clinic behavioral health specialist for ongoing support with tobacco cessation (not interested in pharmacotherapy for now). Discussed with Dr. Joya Gaskins and he approved addition of Spiriva with cost assistance (discontinued previously due to cost)   Confirmed importance and date/time of follow-up visits scheduled yes  Provider Appointment booked with [facility] 03/29/2019. Patient also reports new pulmonologist on case, will contact Dr. Joya Gaskins and notify him of findings.  Confirmed with patient if condition begins to worsen call PCP or go to the ER.  Patient was given the office number and encouraged to call back with question or concerns.  : yes

## 2019-03-23 NOTE — Telephone Encounter (Signed)
Transition Care Management Follow-up Telephone Call  Date of discharge and from where: 03/22/2019  How have you been since you were released from the hospital? Breathing is "much better".  Pt states that she has not started the prednisone and zithromax rx, but CMA encouraged her to do so.   Any questions or concerns? No   Items Reviewed:  Did the pt receive and understand the discharge instructions provided? Yes   Medications obtained and verified? Yes   Any new allergies since your discharge? No   Dietary orders reviewed? Yes  Do you have support at home? Yes   Functional Questionnaire: (I = Independent and D = Dependent) ADLs: I  Bathing/Dressing- I  Meal Prep- I  Eating- I  Maintaining continence- I  Transferring/Ambulation- I  Managing Meds- I  Follow up appointments reviewed:   PCP Hospital f/u appt confirmed? Yes  Scheduled to see [facility] on 5/21 @ 10:30  Specialist Hospital f/u appt confirmed? Yes  Scheduled to see DrWright (Pulmonary) on 5/20.  Are transportation arrangements needed? No   If their condition worsens, is the pt aware to call PCP or go to the Emergency Dept.? Yes  Was the patient provided with contact information for the PCP's office or ED? Yes  Was to pt encouraged to call back with questions or concerns? Yes

## 2019-03-24 LAB — CULTURE, BLOOD (ROUTINE X 2)
Culture: NO GROWTH
Culture: NO GROWTH
Special Requests: ADEQUATE

## 2019-03-26 MED ORDER — TIOTROPIUM BROMIDE MONOHYDRATE 2.5 MCG/ACT IN AERS: 2.0000 | INHALATION_SPRAY | Freq: Every day | RESPIRATORY_TRACT | 0 refills | Status: DC

## 2019-03-26 NOTE — Addendum Note (Signed)
Addended by: Forde Dandy on: 03/26/2019 10:57 AM   Modules accepted: Orders

## 2019-03-27 ENCOUNTER — Telehealth: Payer: Self-pay | Admitting: Licensed Clinical Social Worker

## 2019-03-27 NOTE — Telephone Encounter (Signed)
Patient was contacted due to a recent referral from Dr. Maudie Mercury. Patient is interested in services, but reported she would like to be contacted in a few weeks to schedule.

## 2019-03-27 NOTE — Progress Notes (Deleted)
Subjective:    Patient ID: [name], female    DOB: 05-19-1951, 68 y.o.   MRN: [id number]  This is a telephone visit with a 68 year old female with chronic obstructive lung disease.  I last connected with this patient telephonically on 22 April.  Her history at that time was as below. This is a 68 year old female with history of chronic obstructive lung disease recently hospitalized from the 12th and 15 April for a COPD exacerbation. The patient was discharged on oral prednisone and Ceftin ear and has finished these medications. The patient is seen by way of a telephone visit. Patient states overall her shortness of breath is somewhat improved. She is anxious at times and can become panicky at times as well. She also has hypertension and when questioned she did not get the losartan feel that is and is only taking the amlodipine and metoprolol and is not taking losartan. The patient used to be on Breo inhalers in the past but now is only on the albuterol alone. The patient is followed in the internal medicine service clinic and at 1 point was told she could not return because she missed 3 appointments but apparently now will reestablish there for primary care. The patient in addition to hypertension has diabetes however her blood sugars had improved as the prednisone was tapered. Below are excerpts from the patient's discharge summary Date of Admission:02/18/2019 2:18 PM Date of Discharge:02/21/2019 Attending Physician:Alexander Rebeca Alert, MD, PhD  Hospital Course by problem list: 1.COPD exacerbation. 68 year old woman withPMHxof COPD, DM II, and HTN admittedforrecurrent versus persistent COPD exacerbation.She had arecent admission (last month)for COPD exacerbation dueto Influenza Aand wasdischarged withaprednisone taper, but never returned tonormal.She had recentworsening of herproductivecough and shortness of breathwithcold sweats, nasal congestionandcame to ED  for further evaluation. She denied any recent travel or possibleCOVID-19 exposure.  On arrival, she was tachycardic and tachypneic with wheezing,saturating at 89%.She had nofever and noleucocytosis. Lactic acid was elevated but then normalized when repeated.CXR withoutconsolidation or evidence of pneumonia. She receivedalbuteroland solumedrol treatmentat ED. Shealsowasstarted on IV Ceftriaxone and Prednisone 40 mg QD and receivedalbuterol and Ipratropium.Viral respiratory panel andSARS-CoV2test came back negative. Her symptoms improved with treatment.She maintained O2 satwell with ambulation at room air.She is discharged home with PO Cefdinir(total5 day course of antibiotic)and PO prednisone (tapering over 2 weeks.)  Hypokalemia:Patient hadK 3.0when admitted,likely 2/2 albuterol use. K replaced andwas 3.6 at discharge.  DM II:She is on Metformin 1000 mg BID at home thatwasheld in thehospital.She hadsome elevated BG between 200-270 when on Prednisone in hospitalthatwascontrolled with SSI.She should resume metformin at discharge.  Note since the last office visit she reports she has yet to receive her Symbicort inhaler.  This was to have been mailed to the patient but apparently because of insurance and lack of funding she was not able to obtain the medication.  She only has a nebulized DuoNeb which she takes as needed.  She notes an increased cough of yellow mucus and increased sinus drainage as well.  She has sneezing and a loss of smell.  She states she is not checking her blood glucose because she lost her glucose meter.  Patient states she is tried Flonase in the past this has not been of much benefit.  She did receive the losartan and is taking 100 mg of this daily along with hydrochlorothiazide and amlodipine.  . She does not note she had seen [facility] several years ago. She had a pulmonary function in 2016 which showed an FEV1 of  68% predicted  diffusion capacity of 69% predicted.  Pos inBOLD Constitutional: No weight loss, night sweats, Fevers, chills, fatigue, lassitude. HEENT: No headaches, Difficulty swallowing, Tooth/dental problems, Sore throat,  No sneezing, itching, ear ache, nasal congestion, post nasal drip,   CV: No chest pain, Orthopnea, PND, swelling in lower extremities, anasarca, dizziness, palpitations  GI No heartburn, indigestion, abdominal pain, nausea, vomiting, diarrhea, change in bowel habits, loss of appetite  Resp: shortness of breath with exertion or at rest.No excess mucus, no productive cough, non-productive cough, No coughing up of blood. No change in color of mucus. No wheezing. No chest wall deformity  Skin: no rash or lesions.  GU: no dysuria, change in color of urine, no urgency or frequency. No flank pain.  MS: No joint pain or swelling. No decreased range of motion. No back pain.  Psych: No change in mood or affect. No depressionranxiety.No memory loss.  Observations/Objective: There were no observations as this was a telephone visit  Assessment and Plan: #1 COPD with asthmatic bronchitic component and previous history of allergic rhinitis and chronic sinusitis             Plan will be to obtain Advair HFA as we have samples of this medication 250/50 at 1 puff twice daily patient will also receive albuterol as needed             No additional prednisone is indicated however for the patient's allergic rhinitis we will begin Zyrtec 10 mg daily  #2 hypertension: The patient's not been measuring her blood pressure at home she will obtain a blood pressure cuff and measure this and report back for results and we will maintain medications as prescribed  #3 diabetes type 2 the patient will obtain a new glucose meter and report back her results of her CBGs and in the meantime we will maintain metformin at currently dosed level Note also a  hepatitis C study will be obtained      Review of Systems     Objective:   Physical Exam        Assessment & Plan:

## 2019-03-28 ENCOUNTER — Other Ambulatory Visit: Payer: Self-pay | Admitting: Pharmacist

## 2019-03-28 ENCOUNTER — Ambulatory Visit: Payer: Medicare Other | Admitting: Critical Care Medicine

## 2019-03-28 MED ORDER — ONETOUCH VERIO W/DEVICE KIT: PACK | 0 refills | Status: DC

## 2019-03-28 MED ORDER — GLUCOSE BLOOD VI STRP: ORAL_STRIP | 11 refills | Status: DC

## 2019-03-28 MED ORDER — ONETOUCH DELICA LANCETS 33G MISC: 11 refills | Status: DC

## 2019-03-28 NOTE — Progress Notes (Signed)
Type 2 diabetes mellitus with complication, without long-term current use of insulin (HCC) (E11.8)

## 2019-03-29 ENCOUNTER — Ambulatory Visit (INDEPENDENT_AMBULATORY_CARE_PROVIDER_SITE_OTHER): Payer: Medicare Other | Admitting: Internal Medicine

## 2019-03-29 ENCOUNTER — Other Ambulatory Visit: Payer: Self-pay

## 2019-03-29 VITALS — BP 126/62 | HR 85 | Temp 99.8°F | Wt 202.3 lb

## 2019-03-29 DIAGNOSIS — F32A Depression, unspecified: Secondary | ICD-10-CM

## 2019-03-29 DIAGNOSIS — J449 Chronic obstructive pulmonary disease, unspecified: Secondary | ICD-10-CM

## 2019-03-29 DIAGNOSIS — Z7951 Long term (current) use of inhaled steroids: Secondary | ICD-10-CM | POA: Diagnosis not present

## 2019-03-29 DIAGNOSIS — E118 Type 2 diabetes mellitus with unspecified complications: Secondary | ICD-10-CM

## 2019-03-29 DIAGNOSIS — I10 Essential (primary) hypertension: Secondary | ICD-10-CM | POA: Diagnosis not present

## 2019-03-29 DIAGNOSIS — L700 Acne vulgaris: Secondary | ICD-10-CM | POA: Diagnosis not present

## 2019-03-29 DIAGNOSIS — Z7952 Long term (current) use of systemic steroids: Secondary | ICD-10-CM

## 2019-03-29 DIAGNOSIS — Z79899 Other long term (current) drug therapy: Secondary | ICD-10-CM | POA: Diagnosis not present

## 2019-03-29 DIAGNOSIS — F419 Anxiety disorder, unspecified: Secondary | ICD-10-CM | POA: Diagnosis not present

## 2019-03-29 DIAGNOSIS — F329 Major depressive disorder, single episode, unspecified: Secondary | ICD-10-CM

## 2019-03-29 DIAGNOSIS — F17210 Nicotine dependence, cigarettes, uncomplicated: Secondary | ICD-10-CM

## 2019-03-29 DIAGNOSIS — L299 Pruritus, unspecified: Secondary | ICD-10-CM

## 2019-03-29 LAB — GLUCOSE, CAPILLARY: Glucose-Capillary: 156 mg/dL — ABNORMAL HIGH (ref 70–99)

## 2019-03-29 NOTE — Progress Notes (Signed)
   CC: COPD, hypertension, anxiety, pruritis, and a chin bump  HPI:  [name] is a 68 y.o. who is here for hospital follow-up.  She was admitted from 5/11-5/14 for a COPD exacerbation, hypertension, anxiety and costochondritis.  In regards to her COPD her RVP and COVID testing were negative, chest x-ray was unremarkable, she was treated with antibiotics and steroids, as well as breathing treatments.  Patient was discharged on azithromycin, prednisone, and continued on mucinex, advair, and albuterol. Since that time she reports that she has been feeling well. She does report that she has been having this occasional generalized pruritis that will occur randomly through out the day, it lasts for about 5 minutes, no rash occurs, and it's not related to time of day, location, or anything else that she is aware of. She also reports that she has a bump on her chin that had been getting bigger.   Past Medical History:  Diagnosis Date  . Anxiety   . Arthritis    "fingers, knees" (08/16/2018)  . Asthma   . Cancer of right breast ([place]) 1991   s/p lumpectomy, chemotherapy and radiation therapy in 1991. Mammogram in 2007 was normal.  . Constipated    h/o  . COPD (chronic obstructive pulmonary disease) ([place])    History of multiple hospital admissions for exercabation   . COPD exacerbation ([place]) 01/01/2019  . COPD with acute exacerbation ([place]) 01/14/2019  . COPD with exacerbation ([place]) 04/06/2009   Qualifier: Diagnosis of  By: Eyvonne Mechanic MD, Vijay    . Depression   . Diarrhea    h/o  . GERD (gastroesophageal reflux disease)   . Headache    "a few times/month" (08/16/2018  . Heart murmur 10/05/11   "first time I ever heard I had one was today"  . Hyperlipidemia   . Hypertension   . Obesity   . Personal history of chemotherapy   . Personal history of radiation therapy   . Pneumonia    "couple times in the last 10-15 yrs" (08/16/2018)  . Shortness of breath 10/05/11   "at rest; lying down;  w/exertion"  . Sigmoid diverticulitis 80/2008  . Tobacco abuse   . Type II diabetes mellitus ([place])    Review of Systems:  Reports occasional pruritis, chin bump, and occasional anxiety. Denies any shortness of breath, coughing, chest pain, headaches, fevers, chills, nausea, vomiting, lightheadedness, or dizziness.   Physical Exam:  Vitals:   03/29/19 1020  BP: 126/62  Pulse: 85  Temp: 99.8 F (37.7 C)  TempSrc: Oral  SpO2: 100%  Weight: 202 lb 4.8 oz (91.8 kg)   Physical Exam  Constitutional: She is oriented to person, place, and time and well-developed, well-nourished, and in no distress.  HENT:  Head: Normocephalic.  Papule on left lower chin with white central area, small scab on midline chin, no current drainage, no erythema  Neck: Normal range of motion. Neck supple.  Cardiovascular: Normal rate, regular rhythm and normal heart sounds.  Pulmonary/Chest: Effort normal and breath sounds normal. No respiratory distress.  Abdominal: Soft. Bowel sounds are normal. She exhibits no distension.  Neurological: She is alert and oriented to person, place, and time.  Skin: Skin is warm and dry.  Psychiatric: Affect normal.  Some anxiety related to wearing mask       Assessment & Plan:   See Encounters Tab for problem based charting.  Patient discussed with Dr. Dareen Piano

## 2019-03-29 NOTE — Discharge Summary (Addendum)
Name: [name] MRN: [id number] DOB: 02-05-1951 68 y.o. PCP: Mosetta Anis, MD  Date of Admission: 03/19/2019  9:30 AM Date of Discharge: 03/22/2019 Attending Physician: Dr. Lenice Pressman  Discharge Diagnosis: 1. Principal Problem:   COPD exacerbation ([place]) Active Problems:   Type 2 diabetes mellitus ([place])   Hypokalemia   Allergic rhinitis   Discharge Medications:  Allergies as of 03/22/2019      Reactions   Ace Inhibitors Swelling   Throat swelling. Lisinopril-HCTZ Pt states she in not allergic    Flonase [fluticasone] Other (See Comments)   Sinuses stop up and condition worsens      Medication List    TAKE these medications   acetaminophen 500 MG tablet Commonly known as:  TYLENOL Take 1,000 mg by mouth every 6 (six) hours as needed for headache.   albuterol 108 (90 Base) MCG/ACT inhaler Commonly known as:  VENTOLIN HFA Inhale 2 puffs into the lungs every 6 (six) hours as needed for wheezing or shortness of breath.   amLODipine 10 MG tablet Commonly known as:  NORVASC Take 1 tablet (10 mg total) by mouth daily.   benzonatate 100 MG capsule Commonly known as:  TESSALON Take 1 capsule (100 mg total) by mouth 3 (three) times daily as needed for cough.   cetirizine 10 MG tablet Commonly known as:  ZYRTEC Take 1 tablet (10 mg total) by mouth daily.   dextromethorphan-guaiFENesin 30-600 MG 12hr tablet Commonly known as:  MUCINEX DM Take 1 tablet by mouth 2 (two) times daily.   DULoxetine 60 MG capsule Commonly known as:  CYMBALTA Take 1 capsule (60 mg total) by mouth daily.   fluticasone 50 MCG/ACT nasal spray Commonly known as:  Flonase Place 1 spray into both nostrils daily as needed for allergies or rhinitis.   Fluticasone-Salmeterol 250-50 MCG/DOSE Aepb Commonly known as:  ADVAIR Inhale 1 puff into the lungs 2 (two) times daily.   hydrochlorothiazide 25 MG tablet Commonly known as:  HYDRODIURIL Take 1 tablet (25 mg total) by mouth  daily.   ibuprofen 800 MG tablet Commonly known as:  ADVIL Take 1 tablet (800 mg total) by mouth every 8 (eight) hours as needed for moderate pain (Take with food).   ipratropium-albuterol 0.5-2.5 (3) MG/3ML Soln Commonly known as:  DUONEB Take 3 mLs by nebulization every 6 (six) hours as needed.   losartan 100 MG tablet Commonly known as:  COZAAR Take 1 tablet (100 mg total) by mouth daily.   metFORMIN 1000 MG tablet Commonly known as:  GLUCOPHAGE Take 1 tablet (1,000 mg total) by mouth 2 (two) times daily.   metoprolol tartrate 25 MG tablet Commonly known as:  LOPRESSOR Take 1 tablet (25 mg total) by mouth 2 (two) times daily.   predniSONE 20 MG tablet Commonly known as:  DELTASONE Take 2 tablets (40 mg total) by mouth daily with breakfast for 3 days, THEN 1 tablet (20 mg total) daily with breakfast for 7 days. Start taking on:  Mar 23, 2019   simvastatin 40 MG tablet Commonly known as:  ZOCOR Take 1 tablet (40 mg total) by mouth daily at 6 PM.       azithromycin 250 MG tablet Commonly known as:  ZITHROMAX Take 1 table every day for 3 more days after discharge    oxyCODONE-acetaminophen 5-325 MG tablet Commonly known as:  PERCOCET/ROXICET Take 1-2 tablets by mouth 2 (two) times daily as needed for up to 3 days for moderate pain (If pain was  not improved with Lidocain patch).       Disposition and follow-up:   [name] was discharged from [facility] in Stable condition.  At the hospital follow up visit please address:  1.  Admitted for COPD exacerbation. Discharged with azithromycin (for 2 more days), Mucinex, and prednisone taper. Please evaluate for respiratory symptoms and response.  2. Encouraged to quit smoking, make sure she follows up with her pulmonologist and is compliant with her inhalers.  3. Check CBG as she is on prednisone  4.  Labs / imaging needed at time of follow-up: CBG  5. Pending labs/ test needing follow-up:  None  Follow-up Appointments: Follow-up Information    Mosetta Anis, MD. Go on 03/29/2019.   Specialty:  Internal Medicine Why:  Hospital follow-up @ 10:30am Contact information: [address] [address] [place] Follow up.   Why:  Higher education careers adviser information: [place] [place]       [name], MD. Call in 3 day(s).   Specialty:  Pulmonary Disease Why:  Call and asks about your appointment within a week. Contact information: [address] [place] [address] [phone]           Hospital Course by problem list: 1. COPD exacerbation: 68 year old woman with history of COPD Gold stage II, DMII, and HTN presented with worsening of cough and shortness of breath, as well as runny nose, congestion, altered taste and diaphoresis.  She was admitted for COPD exacerbation. She had 2 recent admissions in past 2 months due to COPD exacerbations.   She smokes and has difficulty affording all of her inhalers.  On arrival, she was afebrile, with no leukocytosis and no lymphopenia, had lactic acid elevated at 2.9 on arrival that came down to 1.9.  Borderline CRP at 1. Chest x-ray was without acute finding.  COVID-19 and RVP came back negative.  She was treated with bronchodilators and prednisone for COPD exacerbation with likely viral etiology. She then developed a productive cough and azithromycin was added.  She also had right rib pain due to costochondritis at the area of old rib fracture that worsened with coughing.  Her pain was managed with ketorolac and oxycodone.    Her symptoms improved and she was discharged home with 2 more days of azithromycin and a prednisone taper for a total of 7 days of 40 mg, then 7 days of 20 mg. She should follow up with Dr. Joya Gaskins, who has agreed to take her on for pulmonology management for the time being. At the time, he will  assess the need to continue prednisone.   Discharge Vitals:   BP (!) 148/84    Pulse 81    Temp 97.8 F (36.6 C) (Oral)    Resp 20    Ht 5\' 6"  (1.676 m)    Wt 90.7 kg    SpO2 100%    BMI 32.28 kg/m   Pertinent Labs, Studies, and Procedures:   CXR: 03/19/2019 COMPARISON:  Radiographs of February 18, 2019.  The heart size and mediastinal contours are within normal limits. Both lungs are clear. No pneumothorax or pleural effusion is noted. Right axillary surgical clips are noted. The visualized skeletal structures are unremarkable.  IMPRESSION: No active disease. 10d ago (03/19/19) 10d ago (03/19/19) 77mo ago (02/18/19)   Lactic Acid, Venous 0.5 - 1.9 mmol/L 1.9  CRP <1.0 mg/dL 1.0High            Ref Range & Units 03/19/2019   SARS Coronavirus 2 NEGATIVE NEGATIVE            Component 10d ago  Specimen Description BLOOD RIGHT HAND   Special Requests BOTTLES DRAWN AEROBIC ONLY Blood Culture adequate volume   Culture NO GROWTH 5 DAYS  Performed at [facility], [place] [address] [place], [place] [address]   Report Status 03/24/2019 FINAL   Resulting Agency [facility]       CBC Latest Ref Rng & Units 03/21/2019 03/19/2019 03/14/2019  WBC 4.0 - 10.5 K/uL 9.4 5.2 6.5  Hemoglobin 12.0 - 15.0 g/dL 12.6 12.2 12.7  Hematocrit 36.0 - 46.0 % 35.7(L) 35.5(L) 37.6  Platelets 150 - 400 K/uL 276 319 341   Discharge Instructions: Discharge Instructions    AMB Referral to [facility]   Complete by:  As directed    Please assign patient to social worker for post hospital follow up for resources for food delivery/pick up in their area.  [See notes] Support for stress. Please assign to community nurse for complex care and disease management COPD exacerbation - follow up calls and assess for further needs.  Questions please call:   Natividad Brood, RN BSN [facility] Liaison  [phone] business mobile phone Toll free office [phone]    Fax number: [phone] [email or web address]   Reason for consult:  Post hospital follow up COPD exacerbation, resources for food   Diagnoses of:  COPD/ Pneumonia   Expected date of contact:  1-3 days (reserved for hospital discharges)   Call MD for:   Complete by:  As directed    Fever, shortness of breath,..   Diet - low sodium heart healthy   Complete by:  As directed    Discharge instructions   Complete by:  As directed    Thank you for allowing [place] taking care of you at [facility]. We are glad that you feel better.  Please take Azithromycin for 3 more days. (start tomorrow) Take Prednisone as instructed: (2  tablets daily for 3 days, then 1 tablet daily until you see Dr. Joya Gaskins next week. I prescribed Ibuprofen to be taken 3 times daily with meal as needed for your rib pain. You can take Oxycodone-acetaminoph as needed 3 times daily if ibuprofen did not help. I prescribed cough medicine to be taken as needed, and Flonase for your allergy.  Please continue rest of your medications as before and make sure to see your primary care doctor for follow up. Please continue good work and try to cut down on smoking cigarettes. As we discussed, due to COVID-19 pandemic, try to stay home as much as possible.  If you had to leave home for essential needs, make sure to use mask and wash your hands completely when coming back home.  Thank you   Increase activity slowly   Complete by:  As directed       Signed: [name], MD 03/29/2019, 3:45 PM   Pager: [phone]    Internal Medicine Attending Note:  I saw and examined the patient on the day of discharge. I reviewed and agree with the discharge summary written by the house staff, with changes and edits as necessary.  Lenice Pressman, M.D., Ph.D.

## 2019-03-29 NOTE — Patient Instructions (Signed)
[name],  It was a pleasure to see you today. Thank you for coming in.   Today we discussed her recent hospitalization, bump on your chin, and generalized itchiness.  I am glad that you are feeling better, your vitals and lungs sounded good today.  Please continue your current medications and follow-up with the pulmonologist.  In regards to the bump on your chin, please keep the area clean and use warm compresses to allow for drainage.  Please avoid squeezing or popping this, as this can increase the risk of an infection.  In regards to your generalized itchiness, please pick up some Benadryl to have on hand for when the symptoms occur.  Please return to clinic in 3 months or sooner if needed.   Thank you again for coming in.   Asencion Noble.D.

## 2019-03-30 ENCOUNTER — Encounter: Payer: Medicare Other | Admitting: Internal Medicine

## 2019-03-30 DIAGNOSIS — L700 Acne vulgaris: Secondary | ICD-10-CM | POA: Insufficient documentation

## 2019-03-30 NOTE — Assessment & Plan Note (Signed)
Patient reports that since she has been discharged she has been having these occasional episodes of generalized pruritus, they occur rarely, occurred about twice since her discharge, they will last about 5 minutes and is not associated with any rashes or lesions.  She reports that when it occurs she could becomes very agitated and will be trying to scratch all over, she has never tried anything for this since it only lasts for 5 minutes at a time.  On chart review it seems like she has had bilateral arm itching associated with atopic dermatitis in the past, she had been prescribed betamethasone cream and hydroxyzine which had helped with the rash.  She had used over-the-counter Benadryl at that time which helped with her symptoms.  Her hydroxyzine was discontinued on a previous appointment and it was switched to duloxetine for anxiety.  At this time we will need more information on when her symptoms occur, advised her to pick up Benadryl to have on hand when she has the symptoms.

## 2019-03-30 NOTE — Assessment & Plan Note (Signed)
She is currently taking amlodipine 10 mg daily, hydrochlorothiazide 25 mg daily, losartan 100 mg daily, and metoprolol 25 mg twice daily.  Her blood pressure today is well controlled.  She denies any issues taking her medications and does not need any refills at this time. BP Readings from Last 3 Encounters:  03/29/19 126/62  03/22/19 (!) 148/84  02/21/19 (!) 148/74   Plan: -Continue current regimen -Has follow-up with PCP in 2 weeks, recheck blood pressure at that time

## 2019-03-30 NOTE — Assessment & Plan Note (Signed)
Patient is currently on duloxetine 60 mg daily, she states that she is doing well on this medication.  She does have episodes of anxiety have her has been doing better.  She had been on hydroxyzine and Wellbutrin in the past but stated that those did not help her anxiety.  On exam patient had a mostly normal affect, she did appear somewhat anxious but this was due to the facemask.  Plan: -Continue duloxetine 60 mg daily

## 2019-03-30 NOTE — Assessment & Plan Note (Signed)
Patient reports that she has had this small lesion on her chin since she had been discharged, she states that occurred on her right side but that resolved then went over to her left side, she states that she has had multiple bumps like this in the past, one in her nose and one in her ear.  She states that she does have some pain.  She has not tried anything on it, but does report that she wants to pop it.  On exam there is a small whitish area on her left and, and a more scabbed over appearance more medial to this.  This does seem to be consistent with acne. Advised patient to not try to extrude the material due to the risk of infection. Advised her to apply warm compresses to the area and keep it clean.

## 2019-03-30 NOTE — Progress Notes (Signed)
Internal Medicine Clinic Attending  Case discussed with Dr. Krienke at the time of the visit.  We reviewed the resident's history and exam and pertinent patient test results.  I agree with the assessment, diagnosis, and plan of care documented in the resident's note.    

## 2019-03-30 NOTE — Assessment & Plan Note (Addendum)
Patient was recently admitted from 5/11 to 5/14 for a COPD exacerbation, this was treated with antibiotics and prednisone.  Her RVP and COVID testing was negative at that time, chest x-ray was unremarkable.  Patient was discharged with a short course of prednisone and azithromycin, as well as Mucinex and her breathing treatments.  Patient states that she finished her antibiotic course but has 2 days left of her prednisone.  Patient is currently using Advair daily and her rescue inhaler albuterol, she states that she has only used the albuterol twice since discharge.  She denies any shortness of breath, cough, wheezing, chest pain, lightheadedness, dizziness, or other symptoms at this time.  Overall she states that she is doing well.  Patient did have a follow-up with Dr. Joya Gaskins, her pulmonologist, however reports that there was a mixup on the telehealth or in person visit so she did not make her appointment.  She reported that she will follow-up with him.  Today her vitals are stable, her lungs are clear to auscultation her breathing appears unlabored.  Plan: -Finish prednisone course -Continue Advair -Continue albuterol as needed -Continue Mucinex and Tessalon Perles as needed -Follow-up with Dr. Joya Gaskins

## 2019-03-30 NOTE — Assessment & Plan Note (Signed)
>>  ASSESSMENT AND PLAN FOR CHRONIC OBSTRUCTIVE PULMONARY DISEASE WITH BRONCHOSPASM ([place]) WRITTEN ON 03/30/2019  9:20 AM BY Asencion Noble, MD  Patient was recently admitted from 5/11 to 5/14 for a COPD exacerbation, this was treated with antibiotics and prednisone.  Her RVP and COVID testing was negative at that time, chest x-ray was unremarkable.  Patient was discharged with a short course of prednisone and azithromycin, as well as Mucinex and her breathing treatments.  Patient states that she finished her antibiotic course but has 2 days left of her prednisone.  Patient is currently using Advair daily and her rescue inhaler albuterol, she states that she has only used the albuterol twice since discharge.  She denies any shortness of breath, cough, wheezing, chest pain, lightheadedness, dizziness, or other symptoms at this time.  Overall she states that she is doing well.  Patient did have a follow-up with Dr. Joya Gaskins, her pulmonologist, however reports that there was a mixup on the telehealth or in person visit so she did not make her appointment.  She reported that she will follow-up with him.  Today her vitals are stable, her lungs are clear to auscultation her breathing appears unlabored.  Plan: -Finish prednisone course -Continue Advair -Continue albuterol as needed -Continue Mucinex and Tessalon Perles as needed -Follow-up with Dr. Joya Gaskins

## 2019-04-05 ENCOUNTER — Telehealth: Payer: Self-pay

## 2019-04-05 NOTE — Telephone Encounter (Signed)
rtc to pt she has audible wheezing. She is ask to go to urg care but refuses, she states she sees [facility] but they will not answer the phone. Confirmed an already scheduled appt w/ dr Truman Hayward 6/2. She is highly encouraged to go to urg care asap.

## 2019-04-05 NOTE — Telephone Encounter (Signed)
Please call pt back regarding sinus drainage and coughing.

## 2019-04-06 ENCOUNTER — Other Ambulatory Visit: Payer: Self-pay

## 2019-04-06 ENCOUNTER — Emergency Department (HOSPITAL_COMMUNITY): Payer: Medicare Other

## 2019-04-06 ENCOUNTER — Inpatient Hospital Stay (HOSPITAL_COMMUNITY)
Admission: EM | Admit: 2019-04-06 | Discharge: 2019-04-10 | DRG: 190 | Disposition: A | Discharge status: Home Health | Payer: Medicare Other | Attending: Internal Medicine | Admitting: Internal Medicine

## 2019-04-06 ENCOUNTER — Encounter (HOSPITAL_COMMUNITY): Payer: Self-pay

## 2019-04-06 DIAGNOSIS — J441 Chronic obstructive pulmonary disease with (acute) exacerbation: Secondary | ICD-10-CM | POA: Diagnosis not present

## 2019-04-06 DIAGNOSIS — E1165 Type 2 diabetes mellitus with hyperglycemia: Secondary | ICD-10-CM | POA: Diagnosis present

## 2019-04-06 DIAGNOSIS — Z7951 Long term (current) use of inhaled steroids: Secondary | ICD-10-CM

## 2019-04-06 DIAGNOSIS — N63 Unspecified lump in unspecified breast: Secondary | ICD-10-CM

## 2019-04-06 DIAGNOSIS — J9601 Acute respiratory failure with hypoxia: Secondary | ICD-10-CM | POA: Diagnosis present

## 2019-04-06 DIAGNOSIS — K219 Gastro-esophageal reflux disease without esophagitis: Secondary | ICD-10-CM | POA: Diagnosis present

## 2019-04-06 DIAGNOSIS — Z9221 Personal history of antineoplastic chemotherapy: Secondary | ICD-10-CM | POA: Diagnosis not present

## 2019-04-06 DIAGNOSIS — Z1159 Encounter for screening for other viral diseases: Secondary | ICD-10-CM | POA: Diagnosis not present

## 2019-04-06 DIAGNOSIS — Z90710 Acquired absence of both cervix and uterus: Secondary | ICD-10-CM | POA: Diagnosis not present

## 2019-04-06 DIAGNOSIS — E785 Hyperlipidemia, unspecified: Secondary | ICD-10-CM | POA: Diagnosis present

## 2019-04-06 DIAGNOSIS — E119 Type 2 diabetes mellitus without complications: Secondary | ICD-10-CM

## 2019-04-06 DIAGNOSIS — Z923 Personal history of irradiation: Secondary | ICD-10-CM

## 2019-04-06 DIAGNOSIS — E876 Hypokalemia: Secondary | ICD-10-CM | POA: Diagnosis present

## 2019-04-06 DIAGNOSIS — I2510 Atherosclerotic heart disease of native coronary artery without angina pectoris: Secondary | ICD-10-CM | POA: Diagnosis present

## 2019-04-06 DIAGNOSIS — Z79899 Other long term (current) drug therapy: Secondary | ICD-10-CM | POA: Diagnosis not present

## 2019-04-06 DIAGNOSIS — T380X5A Adverse effect of glucocorticoids and synthetic analogues, initial encounter: Secondary | ICD-10-CM | POA: Diagnosis present

## 2019-04-06 DIAGNOSIS — E1169 Type 2 diabetes mellitus with other specified complication: Secondary | ICD-10-CM | POA: Diagnosis present

## 2019-04-06 DIAGNOSIS — Z20828 Contact with and (suspected) exposure to other viral communicable diseases: Secondary | ICD-10-CM | POA: Diagnosis not present

## 2019-04-06 DIAGNOSIS — F411 Generalized anxiety disorder: Secondary | ICD-10-CM | POA: Diagnosis present

## 2019-04-06 DIAGNOSIS — Z853 Personal history of malignant neoplasm of breast: Secondary | ICD-10-CM

## 2019-04-06 DIAGNOSIS — Z7984 Long term (current) use of oral hypoglycemic drugs: Secondary | ICD-10-CM | POA: Diagnosis not present

## 2019-04-06 DIAGNOSIS — I10 Essential (primary) hypertension: Secondary | ICD-10-CM | POA: Diagnosis present

## 2019-04-06 DIAGNOSIS — E118 Type 2 diabetes mellitus with unspecified complications: Secondary | ICD-10-CM | POA: Diagnosis present

## 2019-04-06 DIAGNOSIS — J329 Chronic sinusitis, unspecified: Secondary | ICD-10-CM | POA: Diagnosis present

## 2019-04-06 DIAGNOSIS — F329 Major depressive disorder, single episode, unspecified: Secondary | ICD-10-CM | POA: Diagnosis present

## 2019-04-06 DIAGNOSIS — R0602 Shortness of breath: Secondary | ICD-10-CM | POA: Diagnosis not present

## 2019-04-06 DIAGNOSIS — F17210 Nicotine dependence, cigarettes, uncomplicated: Secondary | ICD-10-CM | POA: Diagnosis present

## 2019-04-06 DIAGNOSIS — E1122 Type 2 diabetes mellitus with diabetic chronic kidney disease: Secondary | ICD-10-CM | POA: Diagnosis present

## 2019-04-06 DIAGNOSIS — F419 Anxiety disorder, unspecified: Secondary | ICD-10-CM | POA: Diagnosis present

## 2019-04-06 LAB — CBC WITH DIFFERENTIAL/PLATELET
Abs Immature Granulocytes: 0.04 10*3/uL (ref 0.00–0.07)
Basophils Absolute: 0 10*3/uL (ref 0.0–0.1)
Basophils Relative: 0 %
Eosinophils Absolute: 0.3 10*3/uL (ref 0.0–0.5)
Eosinophils Relative: 3 %
HCT: 38.5 % (ref 36.0–46.0)
Hemoglobin: 13 g/dL (ref 12.0–15.0)
Immature Granulocytes: 0 %
Lymphocytes Relative: 22 %
Lymphs Abs: 2.1 10*3/uL (ref 0.7–4.0)
MCH: 31.6 pg (ref 26.0–34.0)
MCHC: 33.8 g/dL (ref 30.0–36.0)
MCV: 93.4 fL (ref 80.0–100.0)
Monocytes Absolute: 0.5 10*3/uL (ref 0.1–1.0)
Monocytes Relative: 6 %
Neutro Abs: 6.4 10*3/uL (ref 1.7–7.7)
Neutrophils Relative %: 69 %
Platelets: 330 10*3/uL (ref 150–400)
RBC: 4.12 MIL/uL (ref 3.87–5.11)
RDW: 12.8 % (ref 11.5–15.5)
WBC: 9.4 10*3/uL (ref 4.0–10.5)
nRBC: 0 % (ref 0.0–0.2)

## 2019-04-06 LAB — BASIC METABOLIC PANEL
Anion gap: 12 (ref 5–15)
BUN: 9 mg/dL (ref 8–23)
CO2: 29 mmol/L (ref 22–32)
Calcium: 8.6 mg/dL — ABNORMAL LOW (ref 8.9–10.3)
Chloride: 95 mmol/L — ABNORMAL LOW (ref 98–111)
Creatinine, Ser: 0.72 mg/dL (ref 0.44–1.00)
GFR calc Af Amer: >60 mL/min (ref >60)
GFR calc non Af Amer: >60 mL/min (ref >60)
Glucose, Bld: 120 mg/dL — ABNORMAL HIGH (ref 70–99)
Potassium: 2.9 mmol/L — ABNORMAL LOW (ref 3.5–5.1)
Sodium: 136 mmol/L (ref 135–145)

## 2019-04-06 LAB — CALCIUM: Calcium: 8.4 mg/dL — ABNORMAL LOW (ref 8.9–10.3)

## 2019-04-06 LAB — EXPECTORATED SPUTUM ASSESSMENT W GRAM STAIN, RFLX TO RESP C

## 2019-04-06 LAB — MAGNESIUM: Magnesium: 2 mg/dL (ref 1.7–2.4)

## 2019-04-06 LAB — URINALYSIS, ROUTINE W REFLEX MICROSCOPIC
Bacteria, UA: NONE SEEN
Bilirubin Urine: NEGATIVE
Glucose, UA: >=500 mg/dL — AB
Hgb urine dipstick: NEGATIVE
Ketones, ur: 5 mg/dL — AB
Leukocytes,Ua: NEGATIVE
Nitrite: NEGATIVE
Protein, ur: NEGATIVE mg/dL
Specific Gravity, Urine: 1.023 (ref 1.005–1.030)
pH: 5 (ref 5.0–8.0)

## 2019-04-06 LAB — BRAIN NATRIURETIC PEPTIDE: B Natriuretic Peptide: 58.7 pg/mL (ref 0.0–100.0)

## 2019-04-06 LAB — GLUCOSE, CAPILLARY
Glucose-Capillary: 233 mg/dL — ABNORMAL HIGH (ref 70–99)
Glucose-Capillary: 340 mg/dL — ABNORMAL HIGH (ref 70–99)

## 2019-04-06 LAB — HEMOGLOBIN A1C
Hgb A1c MFr Bld: 6.6 % — ABNORMAL HIGH (ref 4.8–5.6)
Mean Plasma Glucose: 142.72 mg/dL

## 2019-04-06 LAB — SARS CORONAVIRUS 2 BY RT PCR (HOSPITAL ORDER, PERFORMED IN ~~LOC~~ HOSPITAL LAB): SARS Coronavirus 2: NEGATIVE

## 2019-04-06 LAB — PHOSPHORUS: Phosphorus: 3.6 mg/dL (ref 2.5–4.6)

## 2019-04-06 MED ORDER — LEVALBUTEROL HCL 0.63 MG/3ML IN NEBU
0.6300 mg | INHALATION_SOLUTION | Freq: Four times a day (QID) | RESPIRATORY_TRACT | Status: DC
  Administered 2019-04-06 – 2019-04-10 (×16): 0.63 mg via RESPIRATORY_TRACT
  Filled 2019-04-06 (×16): qty 3

## 2019-04-06 MED ORDER — SIMVASTATIN 40 MG PO TABS: 40.0000 mg | ORAL_TABLET | Freq: Every day | ORAL | Status: DC

## 2019-04-06 MED ORDER — ONDANSETRON HCL 4 MG PO TABS: 4.0000 mg | ORAL_TABLET | Freq: Four times a day (QID) | ORAL | Status: DC | PRN

## 2019-04-06 MED ORDER — IPRATROPIUM BROMIDE 0.02 % IN SOLN
0.5000 mg | Freq: Four times a day (QID) | RESPIRATORY_TRACT | Status: DC
  Administered 2019-04-06 – 2019-04-07 (×3): 0.5 mg via RESPIRATORY_TRACT
  Filled 2019-04-06 (×3): qty 2.5

## 2019-04-06 MED ORDER — FLUTICASONE PROPIONATE 50 MCG/ACT NA SUSP
1.0000 | Freq: Every day | NASAL | Status: DC | PRN
  Filled 2019-04-06: qty 16

## 2019-04-06 MED ORDER — DOXYCYCLINE HYCLATE 100 MG PO TABS
100.0000 mg | ORAL_TABLET | Freq: Two times a day (BID) | ORAL | Status: DC
  Administered 2019-04-06 – 2019-04-10 (×9): 100 mg via ORAL
  Filled 2019-04-06 (×9): qty 1

## 2019-04-06 MED ORDER — TIOTROPIUM BROMIDE MONOHYDRATE 2.5 MCG/ACT IN AERS: 2.0000 | INHALATION_SPRAY | Freq: Every day | RESPIRATORY_TRACT | Status: DC

## 2019-04-06 MED ORDER — MAGNESIUM CITRATE PO SOLN: 1.0000 | Freq: Once | ORAL | Status: DC | PRN

## 2019-04-06 MED ORDER — UMECLIDINIUM BROMIDE 62.5 MCG/INH IN AEPB
1.0000 | INHALATION_SPRAY | Freq: Every day | RESPIRATORY_TRACT | Status: DC
  Administered 2019-04-07 – 2019-04-10 (×4): 1 via RESPIRATORY_TRACT
  Filled 2019-04-06: qty 7

## 2019-04-06 MED ORDER — SENNOSIDES-DOCUSATE SODIUM 8.6-50 MG PO TABS: 1.0000 | ORAL_TABLET | Freq: Every evening | ORAL | Status: DC | PRN

## 2019-04-06 MED ORDER — METOPROLOL TARTRATE 25 MG PO TABS
25.0000 mg | ORAL_TABLET | Freq: Two times a day (BID) | ORAL | Status: DC
  Administered 2019-04-06 – 2019-04-10 (×9): 25 mg via ORAL
  Filled 2019-04-06 (×9): qty 1

## 2019-04-06 MED ORDER — IBUPROFEN 800 MG PO TABS: 800.0000 mg | ORAL_TABLET | Freq: Three times a day (TID) | ORAL | Status: DC | PRN

## 2019-04-06 MED ORDER — DULOXETINE HCL 30 MG PO CPEP
60.0000 mg | ORAL_CAPSULE | Freq: Every day | ORAL | Status: DC
  Administered 2019-04-06 – 2019-04-10 (×5): 60 mg via ORAL
  Filled 2019-04-06 (×5): qty 2

## 2019-04-06 MED ORDER — MAGNESIUM SULFATE 2 GM/50ML IV SOLN
2.0000 g | INTRAVENOUS | Status: AC
  Administered 2019-04-06: 09:00:00 2 g via INTRAVENOUS
  Filled 2019-04-06: qty 50

## 2019-04-06 MED ORDER — DOCUSATE SODIUM 100 MG PO CAPS
100.0000 mg | ORAL_CAPSULE | Freq: Two times a day (BID) | ORAL | Status: DC
  Administered 2019-04-06 – 2019-04-10 (×7): 100 mg via ORAL
  Filled 2019-04-06 (×8): qty 1

## 2019-04-06 MED ORDER — SORBITOL 70 % SOLN
30.0000 mL | Freq: Every day | Status: DC | PRN
  Filled 2019-04-06: qty 30

## 2019-04-06 MED ORDER — METHYLPREDNISOLONE SODIUM SUCC 125 MG IJ SOLR
60.0000 mg | Freq: Four times a day (QID) | INTRAMUSCULAR | Status: DC
  Administered 2019-04-06 – 2019-04-10 (×16): 60 mg via INTRAVENOUS
  Filled 2019-04-06 (×16): qty 2

## 2019-04-06 MED ORDER — SODIUM CHLORIDE 0.9% FLUSH
3.0000 mL | Freq: Two times a day (BID) | INTRAVENOUS | Status: DC
  Administered 2019-04-06 – 2019-04-10 (×8): 3 mL via INTRAVENOUS

## 2019-04-06 MED ORDER — ATORVASTATIN CALCIUM 20 MG PO TABS
20.0000 mg | ORAL_TABLET | Freq: Every day | ORAL | Status: DC
  Administered 2019-04-06 – 2019-04-09 (×4): 20 mg via ORAL
  Filled 2019-04-06 (×4): qty 1

## 2019-04-06 MED ORDER — LEVOFLOXACIN 500 MG PO TABS: 500.0000 mg | ORAL_TABLET | Freq: Every day | ORAL | Status: DC

## 2019-04-06 MED ORDER — ALBUTEROL SULFATE HFA 108 (90 BASE) MCG/ACT IN AERS
8.0000 | INHALATION_SPRAY | RESPIRATORY_TRACT | Status: DC | PRN
  Administered 2019-04-06: 12:00:00 8 via RESPIRATORY_TRACT
  Filled 2019-04-06: qty 6.7

## 2019-04-06 MED ORDER — BENZONATATE 100 MG PO CAPS
100.0000 mg | ORAL_CAPSULE | Freq: Once | ORAL | Status: AC
  Administered 2019-04-06: 12:00:00 100 mg via ORAL
  Filled 2019-04-06: qty 1

## 2019-04-06 MED ORDER — ACETAMINOPHEN 325 MG PO TABS: 650.0000 mg | ORAL_TABLET | Freq: Four times a day (QID) | ORAL | Status: DC | PRN

## 2019-04-06 MED ORDER — MAGNESIUM SULFATE 2 GM/50ML IV SOLN
2.0000 g | Freq: Once | INTRAVENOUS | Status: AC
  Administered 2019-04-06: 2 g via INTRAVENOUS
  Filled 2019-04-06: qty 50

## 2019-04-06 MED ORDER — POTASSIUM CHLORIDE CRYS ER 20 MEQ PO TBCR
40.0000 meq | EXTENDED_RELEASE_TABLET | Freq: Once | ORAL | Status: AC
  Administered 2019-04-06: 40 meq via ORAL
  Filled 2019-04-06: qty 2

## 2019-04-06 MED ORDER — KETOROLAC TROMETHAMINE 15 MG/ML IJ SOLN
15.0000 mg | Freq: Four times a day (QID) | INTRAMUSCULAR | Status: DC | PRN
  Administered 2019-04-06 – 2019-04-10 (×9): 15 mg via INTRAVENOUS
  Filled 2019-04-06 (×9): qty 1

## 2019-04-06 MED ORDER — HEPARIN SODIUM (PORCINE) 5000 UNIT/ML IJ SOLN
5000.0000 [IU] | Freq: Three times a day (TID) | INTRAMUSCULAR | Status: DC
  Administered 2019-04-06 – 2019-04-10 (×12): 5000 [IU] via SUBCUTANEOUS
  Filled 2019-04-06 (×13): qty 1

## 2019-04-06 MED ORDER — ALBUTEROL SULFATE (2.5 MG/3ML) 0.083% IN NEBU
2.5000 mg | INHALATION_SOLUTION | RESPIRATORY_TRACT | Status: DC | PRN
  Administered 2019-04-07 – 2019-04-10 (×2): 2.5 mg via RESPIRATORY_TRACT
  Filled 2019-04-06 (×2): qty 3

## 2019-04-06 MED ORDER — ALBUTEROL SULFATE HFA 108 (90 BASE) MCG/ACT IN AERS
8.0000 | INHALATION_SPRAY | RESPIRATORY_TRACT | Status: DC | PRN
  Administered 2019-04-06: 09:00:00 8 via RESPIRATORY_TRACT
  Filled 2019-04-06: qty 6.7

## 2019-04-06 MED ORDER — MAGNESIUM SULFATE 50 % IJ SOLN: 2.0000 g | Freq: Once | INTRAMUSCULAR | Status: DC

## 2019-04-06 MED ORDER — INSULIN ASPART 100 UNIT/ML ~~LOC~~ SOLN
0.0000 [IU] | Freq: Three times a day (TID) | SUBCUTANEOUS | Status: DC
  Administered 2019-04-06: 18:00:00 11 [IU] via SUBCUTANEOUS
  Administered 2019-04-06: 5 [IU] via SUBCUTANEOUS
  Administered 2019-04-07: 11 [IU] via SUBCUTANEOUS
  Administered 2019-04-07: 2 [IU] via SUBCUTANEOUS
  Administered 2019-04-07: 5 [IU] via SUBCUTANEOUS
  Administered 2019-04-08: 11 [IU] via SUBCUTANEOUS
  Administered 2019-04-08: 5 [IU] via SUBCUTANEOUS
  Administered 2019-04-08: 8 [IU] via SUBCUTANEOUS
  Administered 2019-04-09: 5 [IU] via SUBCUTANEOUS
  Administered 2019-04-09: 8 [IU] via SUBCUTANEOUS
  Administered 2019-04-09: 5 [IU] via SUBCUTANEOUS
  Administered 2019-04-10: 15 [IU] via SUBCUTANEOUS
  Administered 2019-04-10: 5 [IU] via SUBCUTANEOUS

## 2019-04-06 MED ORDER — METHYLPREDNISOLONE SODIUM SUCC 125 MG IJ SOLR
125.0000 mg | Freq: Once | INTRAMUSCULAR | Status: AC
  Administered 2019-04-06: 09:00:00 125 mg via INTRAVENOUS
  Filled 2019-04-06: qty 2

## 2019-04-06 MED ORDER — HYDROCODONE-ACETAMINOPHEN 5-325 MG PO TABS
1.0000 | ORAL_TABLET | ORAL | Status: DC | PRN
  Administered 2019-04-06 – 2019-04-10 (×7): 2 via ORAL
  Filled 2019-04-06 (×7): qty 2

## 2019-04-06 MED ORDER — GUAIFENESIN-DM 100-10 MG/5ML PO SYRP
10.0000 mL | ORAL_SOLUTION | Freq: Four times a day (QID) | ORAL | Status: DC
  Administered 2019-04-06 – 2019-04-10 (×17): 10 mL via ORAL
  Filled 2019-04-06 (×16): qty 10

## 2019-04-06 MED ORDER — ACETAMINOPHEN 650 MG RE SUPP: 650.0000 mg | Freq: Four times a day (QID) | RECTAL | Status: DC | PRN

## 2019-04-06 MED ORDER — LORATADINE 10 MG PO TABS
10.0000 mg | ORAL_TABLET | Freq: Every day | ORAL | Status: DC
  Administered 2019-04-07 – 2019-04-10 (×4): 10 mg via ORAL
  Filled 2019-04-06 (×4): qty 1

## 2019-04-06 MED ORDER — ZOLPIDEM TARTRATE 5 MG PO TABS: 5.0000 mg | ORAL_TABLET | Freq: Every evening | ORAL | Status: DC | PRN

## 2019-04-06 MED ORDER — ONDANSETRON HCL 4 MG/2ML IJ SOLN: 4.0000 mg | Freq: Four times a day (QID) | INTRAMUSCULAR | Status: DC | PRN

## 2019-04-06 MED ORDER — AMLODIPINE BESYLATE 5 MG PO TABS
10.0000 mg | ORAL_TABLET | Freq: Every day | ORAL | Status: DC
  Administered 2019-04-06 – 2019-04-10 (×5): 10 mg via ORAL
  Filled 2019-04-06 (×5): qty 2

## 2019-04-06 MED ORDER — SODIUM CHLORIDE 0.9 % IV SOLN
INTRAVENOUS | Status: DC | PRN
  Administered 2019-04-06: 250 mL via INTRAVENOUS
  Administered 2019-04-06: 1000 mL via INTRAVENOUS

## 2019-04-06 MED ORDER — MAGNESIUM SULFATE 50 % IJ SOLN: 1.0000 g | Freq: Once | INTRAMUSCULAR | Status: DC

## 2019-04-06 NOTE — Evaluation (Signed)
Physical Therapy Evaluation Patient Details Name: [name] MRN: [id number] DOB: 06-20-1951 Today's Date: 04/06/2019   History of Present Illness  68 y.o. female with medical history significant of COPD/HTN/ HLD//depression/arthritis/history of right breast cancer , was hospitalized 2 weeks ago for COPD exacerbation and admitted 04/06/19 for acute respiratory failure and COPD exacerbation  Clinical Impression  Pt admitted with above diagnosis. Pt currently with functional limitations due to the deficits listed below (see PT Problem List). Pt will benefit from skilled PT to increase their independence and safety with mobility to allow discharge to the venue listed below.   Pt reports difficulty tolerating even minimal ambulation at home with assist from spouse prior to admission.  Pt assisted with ambulating in hallway as tolerated and then back to bed (per her request).     Follow Up Recommendations Home health PT    Equipment Recommendations  Other (comment)(would benefit from rollator to assist with stability and endurance)    Recommendations for Other Services       Precautions / Restrictions Precautions Precautions: None      Mobility  Bed Mobility Overal bed mobility: Needs Assistance Bed Mobility: Supine to Sit;Sit to Supine     Supine to sit: Supervision;HOB elevated Sit to supine: Supervision   General bed mobility comments: effortful however no assist required, SPO2 98% room air at rest  Transfers Overall transfer level: Needs assistance Equipment used: None Transfers: Sit to/from Stand Sit to Stand: Min guard         General transfer comment: min/guard for safety  Ambulation/Gait Ambulation/Gait assistance: Min guard Gait Distance (Feet): 60 Feet Assistive device: None Gait Pattern/deviations: Step-through pattern;Decreased stride length     General Gait Details: slightly unsteady gait however pt declined use of RW, pt utilized hand rail  occasionally for stability, SpO2 94-98% on room air during ambulation however pt reports 2/4 dyspnea; encouraged to minimize talking to assist with decreasing SOB  Stairs            Wheelchair Mobility    Modified Rankin (Stroke Patients Only)       Balance Overall balance assessment: Needs assistance         Standing balance support: No upper extremity supported Standing balance-Leahy Scale: Fair                               Pertinent Vitals/Pain Pain Assessment: Faces Faces Pain Scale: Hurts little more Pain Location: ribs (reports hx of "broken ribs" from coughing/sneezing) Pain Descriptors / Indicators: Sore Pain Intervention(s): Repositioned;Monitored during session    Home Living Family/patient expects to be discharged to:: Private residence Living Arrangements: Spouse/significant other;Children Available Help at Discharge: Family Type of Home: House Home Access: Stairs to enter Entrance Stairs-Rails: None Technical brewer of Steps: 2 Home Layout: Multi-level Home Equipment: None      Prior Function Level of Independence: Independent               Hand Dominance        Extremity/Trunk Assessment        Lower Extremity Assessment Lower Extremity Assessment: Generalized weakness    Cervical / Trunk Assessment Cervical / Trunk Assessment: Normal  Communication      Cognition Arousal/Alertness: Awake/alert Behavior During Therapy: WFL for tasks assessed/performed Overall Cognitive Status: Within Functional Limits for tasks assessed  General Comments      Exercises     Assessment/Plan    PT Assessment Patient needs continued PT services  PT Problem List Decreased strength;Decreased mobility;Decreased activity tolerance;Cardiopulmonary status limiting activity;Decreased knowledge of use of DME;Decreased balance       PT Treatment Interventions Gait  training;Therapeutic exercise;DME instruction;Therapeutic activities;Patient/family education;Stair training;Functional mobility training;Balance training    PT Goals (Current goals can be found in the Care Plan section)  Acute Rehab PT Goals PT Goal Formulation: With patient Time For Goal Achievement: 04/13/19 Potential to Achieve Goals: Good    Frequency Min 3X/week   Barriers to discharge        Co-evaluation PT/OT/SLP Co-Evaluation/Treatment: Yes Reason for Co-Treatment: To address functional/ADL transfers PT goals addressed during session: Mobility/safety with mobility OT goals addressed during session: ADL's and self-care       AM-PAC PT "6 Clicks" Mobility  Outcome Measure Help needed turning from your back to your side while in a flat bed without using bedrails?: A Little Help needed moving from lying on your back to sitting on the side of a flat bed without using bedrails?: A Little Help needed moving to and from a bed to a chair (including a wheelchair)?: A Little Help needed standing up from a chair using your arms (e.g., wheelchair or bedside chair)?: A Little Help needed to walk in hospital room?: A Little Help needed climbing 3-5 steps with a railing? : A Little 6 Click Score: 18    End of Session Equipment Utilized During Treatment: Gait belt Activity Tolerance: Patient tolerated treatment well Patient left: in bed;with call bell/phone within reach;with bed alarm set Nurse Communication: Mobility status PT Visit Diagnosis: Other abnormalities of gait and mobility (R26.89)    Time: 6004-5997 PT Time Calculation (min) (ACUTE ONLY): 21 min   Charges:   PT Evaluation $PT Eval Low Complexity: [place], PT, DPT Acute Rehabilitation Services Office: [phone] Pager: [phone]  Trena Platt 04/06/2019, 3:59 PM

## 2019-04-06 NOTE — ED Triage Notes (Signed)
Patient reports that she has a history of COPD and has had SOB with worsening today. Patient states she was hospitalized 2 weeks ago for COPD exacerbation. Patient has a productive cough with clear sputum.

## 2019-04-06 NOTE — ED Notes (Signed)
ED TO INPATIENT HANDOFF REPORT  ED Nurse Name and Phone #: [phone] Cari, RN  S Name/Age/Gender [name] 68 y.o. female Room/Bed: WA17/WA17  Code Status   Code Status: Full Code  Home/SNF/Other Home Patient oriented to: self, place, time and situation Is this baseline? Yes   Triage Complete: Triage complete  Chief Complaint diff breathing  Triage Note Patient reports that she has a history of COPD and has had SOB with worsening today. Patient states she was hospitalized 2 weeks ago for COPD exacerbation. Patient has a productive cough with clear sputum.   Allergies Allergies  Allergen Reactions  . Ace Inhibitors Swelling    Throat swelling. Lisinopril-HCTZ Pt states she in not allergic   . Flonase [Fluticasone] Other (See Comments)    Sinuses stop up and condition worsens    Level of Care/Admitting Diagnosis ED Disposition    ED Disposition Condition Comment   Admit  Hospital Area: [facility] [[id number]]  Level of Care: Telemetry [5]  Admit to tele based on following criteria: Other see comments  Comments: Hypoxia, tachycardia, shortness of breath  Covid Evaluation: Confirmed COVID Negative  Diagnosis: COPD exacerbation [facility]) [[id number]]  Admitting Physician: Acquanetta Sit  Attending Physician: Acquanetta Sit  Estimated length of stay: 3 - 4 days  Certification:: I certify this patient will need inpatient services for at least 2 midnights  PT Class (Do Not Modify): Inpatient [101]  PT Acc Code (Do Not Modify): Private [1]       B Medical/Surgery History Past Medical History:  Diagnosis Date  . Anxiety   . Arthritis    "fingers, knees" (08/16/2018)  . Asthma   . Cancer of right breast ([place]) 1991   s/p lumpectomy, chemotherapy and radiation therapy in 1991. Mammogram in 2007 was normal.  . Constipated    h/o  . COPD (chronic obstructive pulmonary disease) ([place])    History of multiple hospital  admissions for exercabation   . COPD exacerbation ([place]) 01/01/2019  . COPD with acute exacerbation ([place]) 01/14/2019  . COPD with exacerbation ([place]) 04/06/2009   Qualifier: Diagnosis of  By: Eyvonne Mechanic MD, Vijay    . Depression   . Diarrhea    h/o  . GERD (gastroesophageal reflux disease)   . Headache    "a few times/month" (08/16/2018  . Heart murmur 10/05/11   "first time I ever heard I had one was today"  . Hyperlipidemia   . Hypertension   . Obesity   . Personal history of chemotherapy   . Personal history of radiation therapy   . Pneumonia    "couple times in the last 10-15 yrs" (08/16/2018)  . Shortness of breath 10/05/11   "at rest; lying down; w/exertion"  . Sigmoid diverticulitis 80/2008  . Tobacco abuse   . Type II diabetes mellitus ([place])    Past Surgical History:  Procedure Laterality Date  . ABDOMINAL HYSTERECTOMY    . ANTERIOR CERVICAL DECOMP/DISCECTOMY FUSION  2012   "Dr. Lynann Bologna  put plate in; did something to my vertebrae"  . BACK SURGERY    . BREAST LUMPECTOMY Right 1991  . DOBUTAMINE STRESS ECHO  08/2004   Inferior ischemia, normal LV systolic function, no significant CAD     A IV Location/Drains/Wounds Patient Lines/Drains/Airways Status   Active Line/Drains/Airways    Name:   Placement date:   Placement time:   Site:   Days:   Peripheral IV 04/06/19 Left Antecubital   04/06/19  0827    Antecubital   less than 1   External Urinary Catheter   04/06/19    0856    -   less than 1          Intake/Output Last 24 hours  Intake/Output Summary (Last 24 hours) at 04/06/2019 1241 Last data filed at 04/06/2019 1054 Gross per 24 hour  Intake 51.28 ml  Output -  Net 51.28 ml    Labs/Imaging Results for orders placed or performed during the hospital encounter of 04/06/19 (from the past 48 hour(s))  Basic metabolic panel     Status: Abnormal   Collection Time: 04/06/19  8:43 AM  Result Value Ref Range   Sodium 136 135 - 145 mmol/L   Potassium 2.9 (L) 3.5 -  5.1 mmol/L   Chloride 95 (L) 98 - 111 mmol/L   CO2 29 22 - 32 mmol/L   Glucose, Bld 120 (H) 70 - 99 mg/dL   BUN 9 8 - 23 mg/dL   Creatinine, Ser 0.72 0.44 - 1.00 mg/dL   Calcium 8.6 (L) 8.9 - 10.3 mg/dL   GFR calc non Af Amer >60 >60 mL/min   GFR calc Af Amer >60 >60 mL/min   Anion gap 12 5 - 15    Comment: Performed at [facility], [place] [address], [place], [place] [address]  Brain natriuretic peptide     Status: None   Collection Time: 04/06/19  8:43 AM  Result Value Ref Range   B Natriuretic Peptide 58.7 0.0 - 100.0 pg/mL    Comment: Performed at [facility], [place] [address], [place],  [address]  SARS Coronavirus 2 (CEPHEID - Performed in [place] hospital lab), Hosp Order     Status: None   Collection Time: 04/06/19  8:44 AM  Result Value Ref Range   SARS Coronavirus 2 NEGATIVE NEGATIVE    Comment: (NOTE) If result is NEGATIVE SARS-CoV-2 target nucleic acids are NOT DETECTED. The SARS-CoV-2 RNA is generally detectable in upper and lower  respiratory specimens during the acute phase of infection. The lowest  concentration of SARS-CoV-2 viral copies this assay can detect is 250  copies / mL. A negative result does not preclude SARS-CoV-2 infection  and should not be used as the sole basis for treatment or other  patient management decisions.  A negative result may occur with  improper specimen collection / handling, submission of specimen other  than nasopharyngeal swab, presence of viral mutation(s) within the  areas targeted by this assay, and inadequate number of viral copies  (<250 copies / mL). A negative result must be combined with clinical  observations, patient history, and epidemiological information. If result is POSITIVE SARS-CoV-2 target nucleic acids are DETECTED. The SARS-CoV-2 RNA is generally detectable in upper and lower  respiratory specimens dur ing the acute phase of infection.  Positive  results are  indicative of active infection with SARS-CoV-2.  Clinical  correlation with patient history and other diagnostic information is  necessary to determine patient infection status.  Positive results do  not rule out bacterial infection or co-infection with other viruses. If result is PRESUMPTIVE POSTIVE SARS-CoV-2 nucleic acids MAY BE PRESENT.   A presumptive positive result was obtained on the submitted specimen  and confirmed on repeat testing.  While 2019 novel coronavirus  (SARS-CoV-2) nucleic acids may be present in the submitted sample  additional confirmatory testing may be necessary for epidemiological  and / or clinical management purposes  to  differentiate between  SARS-CoV-2 and other Sarbecovirus currently known to infect humans.  If clinically indicated additional testing with an alternate test  methodology [id number]) is advised. The SARS-CoV-2 RNA is generally  detectable in upper and lower respiratory sp ecimens during the acute  phase of infection. The expected result is Negative. Fact Sheet for Patients:  StrictlyIdeas.no Fact Sheet for Healthcare Providers: BankingDealers.co.za This test is not yet approved or cleared by the [place] FDA and has been authorized for detection and/or diagnosis of SARS-CoV-2 by FDA under an Emergency Use Authorization (EUA).  This EUA will remain in effect (meaning this test can be used) for the duration of the COVID-19 declaration under Section 564(b)(1) of the Act, 21 U.S.C. section 360bbb-3(b)(1), unless the authorization is terminated or revoked sooner. Performed at [facility], [place] [address] [place], [place] [address]   CBC with Differential/Platelet     Status: None   Collection Time: 04/06/19  8:45 AM  Result Value Ref Range   WBC 9.4 4.0 - 10.5 K/uL   RBC 4.12 3.87 - 5.11 MIL/uL   Hemoglobin 13.0 12.0 - 15.0 g/dL   HCT 38.5 36.0 - 46.0 %   MCV 93.4 80.0 -  100.0 fL   MCH 31.6 26.0 - 34.0 pg   MCHC 33.8 30.0 - 36.0 g/dL   RDW 12.8 11.5 - 15.5 %   Platelets 330 150 - 400 K/uL   nRBC 0.0 0.0 - 0.2 %   Neutrophils Relative % 69 %   Neutro Abs 6.4 1.7 - 7.7 K/uL   Lymphocytes Relative 22 %   Lymphs Abs 2.1 0.7 - 4.0 K/uL   Monocytes Relative 6 %   Monocytes Absolute 0.5 0.1 - 1.0 K/uL   Eosinophils Relative 3 %   Eosinophils Absolute 0.3 0.0 - 0.5 K/uL   Basophils Relative 0 %   Basophils Absolute 0.0 0.0 - 0.1 K/uL   Immature Granulocytes 0 %   Abs Immature Granulocytes 0.04 0.00 - 0.07 K/uL    Comment: Performed at [facility], [place] [address] [place], [place] [address]   Dg Chest Port 1 View  Result Date: 04/06/2019 CLINICAL DATA:  Worsening shortness of breath today. Recent hospitalization for COPD exacerbation. History of diabetes and breast cancer. EXAM: PORTABLE CHEST 1 VIEW COMPARISON:  Radiographs 03/19/2019 and 02/18/2019. FINDINGS: 0754 hours. The heart size and mediastinal contours are normal. The lungs are clear. There is no pleural effusion or pneumothorax. No acute osseous findings are identified. There are surgical clips in the right axilla, multiple right-sided rib fractures and postsurgical changes in the lower cervical spine. Telemetry leads overlie the chest. IMPRESSION: Stable chest.  No active cardiopulmonary process. Electronically Signed   By: Richardean Sale M.D.   On: 04/06/2019 08:35    Pending Labs Unresulted Labs (From admission, onward)    Start     Ordered   04/07/19 4782  Basic metabolic panel  Daily,   R     04/06/19 1156   04/07/19 0500  CBC  Daily,   R     04/06/19 1156   04/07/19 0500  Protime-INR  Tomorrow morning,   R     04/06/19 1156   04/07/19 0500  APTT  Tomorrow morning,   R     04/06/19 1156   04/06/19 1153  Calcium  Once,   R     04/06/19 1156   04/06/19 1153  Magnesium  Once,   R  04/06/19 1156   04/06/19 1153  Phosphorus  Once,   R     04/06/19 1156   04/06/19  1153  Culture, sputum-assessment  Once,   R     04/06/19 1156   04/06/19 1153  Brain natriuretic peptide  Once,   R     04/06/19 1156   04/06/19 1153  Hemoglobin A1c  Once,   R     04/06/19 1156   04/06/19 1153  Urinalysis, Routine w reflex microscopic  Once,   R     04/06/19 1156   04/06/19 1152  CBC  (heparin)  Once,   R    Comments:  Baseline for heparin therapy IF NOT ALREADY DRAWN.  Notify MD if PLT < 100 K.    04/06/19 1156   04/06/19 1152  Creatinine, serum  (heparin)  Once,   R    Comments:  Baseline for heparin therapy IF NOT ALREADY DRAWN.    04/06/19 1156          Vitals/Pain Today's Vitals   04/06/19 1030 04/06/19 1138 04/06/19 1200 04/06/19 1230  BP: (!) 153/74 138/60 (!) 155/80 (!) 154/71  Pulse: (!) 108 (!) 122 (!) 105 (!) 103  Resp: (!) 21 (!) 36 (!) 22 20  Temp:      TempSrc:      SpO2: 99% 100% 100% 99%  Weight:      Height:      PainSc:        Isolation Precautions No active isolations  Medications Medications  0.9 %  sodium chloride infusion ( Intravenous Stopped 04/06/19 1054)  albuterol (VENTOLIN HFA) 108 (90 Base) MCG/ACT inhaler 8 puff (8 puffs Inhalation Given 04/06/19 1140)  heparin injection 5,000 Units (has no administration in time range)  sodium chloride flush (NS) 0.9 % injection 3 mL (has no administration in time range)  acetaminophen (TYLENOL) tablet 650 mg (has no administration in time range)    Or  acetaminophen (TYLENOL) suppository 650 mg (has no administration in time range)  HYDROcodone-acetaminophen (NORCO/VICODIN) 5-325 MG per tablet 1-2 tablet (has no administration in time range)  ketorolac (TORADOL) 15 MG/ML injection 15 mg (has no administration in time range)  docusate sodium (COLACE) capsule 100 mg (has no administration in time range)  senna-docusate (Senokot-S) tablet 1 tablet (has no administration in time range)  sorbitol 70 % solution 30 mL (has no administration in time range)  magnesium citrate solution 1 Bottle  (has no administration in time range)  ondansetron (ZOFRAN) tablet 4 mg (has no administration in time range)    Or  ondansetron (ZOFRAN) injection 4 mg (has no administration in time range)  methylPREDNISolone sodium succinate (SOLU-MEDROL) 125 mg/2 mL injection 60 mg (has no administration in time range)  guaiFENesin-dextromethorphan (ROBITUSSIN DM) 100-10 MG/5ML syrup 10 mL (has no administration in time range)  levalbuterol (XOPENEX) nebulizer solution 0.63 mg (has no administration in time range)  ipratropium (ATROVENT) nebulizer solution 0.5 mg (has no administration in time range)  loratadine (CLARITIN) tablet 10 mg (has no administration in time range)  amLODipine (NORVASC) tablet 10 mg (has no administration in time range)  ibuprofen (ADVIL) tablet 800 mg (has no administration in time range)  metoprolol tartrate (LOPRESSOR) tablet 25 mg (has no administration in time range)  simvastatin (ZOCOR) tablet 40 mg (has no administration in time range)  DULoxetine (CYMBALTA) DR capsule 60 mg (has no administration in time range)  fluticasone (FLONASE) 50 MCG/ACT nasal spray 1 spray (has no administration  in time range)  Tiotropium Bromide Monohydrate AERS 2 puff (has no administration in time range)  insulin aspart (novoLOG) injection 0-15 Units (has no administration in time range)  magnesium sulfate IVPB 2 g 50 mL (has no administration in time range)  doxycycline (VIBRA-TABS) tablet 100 mg (has no administration in time range)  methylPREDNISolone sodium succinate (SOLU-MEDROL) 125 mg/2 mL injection 125 mg (125 mg Intravenous Given 04/06/19 0831)  magnesium sulfate IVPB 2 g 50 mL (0 g Intravenous Stopped 04/06/19 1054)  potassium chloride SA (K-DUR) CR tablet 40 mEq (40 mEq Oral Given 04/06/19 0954)  benzonatate (TESSALON) capsule 100 mg (100 mg Oral Given 04/06/19 1151)    Mobility walks with person assist Low fall risk   Focused Assessments Pulmonary Assessment Handoff:  Lung  sounds: Bilateral Breath Sounds: Diminished, Expiratory wheezes L Breath Sounds: Diminished R Breath Sounds: Diminished O2 Device: Room Air        R Recommendations: See Admitting Provider Note  Report given to:   Additional Notes:

## 2019-04-06 NOTE — ED Provider Notes (Signed)
[facility] DEPT Provider Note   CSN: [id number] Arrival date & time: 04/06/19  0751    History   Chief Complaint Chief Complaint  Patient presents with  . Shortness of Breath    HPI [name] is a 68 y.o. female.     The history is provided by the patient and medical records. No language interpreter was used.  Shortness of Breath     68 year old female with significant history of COPD, right breast cancer status post lumpectomy chemo/radiation in 1991, anxiety, diabetes presenting to the ED for evaluation of shortness of breath.  Patient report she was hospitalized approximately 2 weeks ago for shortness of breath related to COPD exacerbation.  She was subsequently discharged from the hospital but states that her symptoms never did improved.  She continues to endorse having shortness of breath on a persistent basis with wheezing.  For the past 3 to 4 days her symptoms worsen.  States she cannot seem to catch her breath, has had nonproductive cough, continue to wheeze and having congestion.  She finished with the antibiotic without any relief.  She was tested negative for COVID-19 during hospitalization.  She denies any active fever nausea or vomiting or diarrhea.  She denies any recent sick contact.  She has been staying at home and self isolate.  She does consume tobacco products.  Past Medical History:  Diagnosis Date  . Anxiety   . Arthritis    "fingers, knees" (08/16/2018)  . Asthma   . Cancer of right breast ([place]) 1991   s/p lumpectomy, chemotherapy and radiation therapy in 1991. Mammogram in 2007 was normal.  . Constipated    h/o  . COPD (chronic obstructive pulmonary disease) ([place])    History of multiple hospital admissions for exercabation   . COPD exacerbation ([place]) 01/01/2019  . COPD with acute exacerbation ([place]) 01/14/2019  . COPD with exacerbation ([place]) 04/06/2009   Qualifier: Diagnosis of  By: Eyvonne Mechanic MD, Vijay    . Depression    . Diarrhea    h/o  . GERD (gastroesophageal reflux disease)   . Headache    "a few times/month" (08/16/2018  . Heart murmur 10/05/11   "first time I ever heard I had one was today"  . Hyperlipidemia   . Hypertension   . Obesity   . Personal history of chemotherapy   . Personal history of radiation therapy   . Pneumonia    "couple times in the last 10-15 yrs" (08/16/2018)  . Shortness of breath 10/05/11   "at rest; lying down; w/exertion"  . Sigmoid diverticulitis 80/2008  . Tobacco abuse   . Type II diabetes mellitus [employer])     Patient Active Problem List   Diagnosis Date Noted  . Acne vulgaris 03/30/2019  . Allergic rhinitis 03/20/2019  . COPD exacerbation ([place]) 03/19/2019  . Hypomagnesemia   . Influenza A 01/14/2019  . Acute frontal sinusitis 01/09/2019  . Anxiety and depression 01/09/2019  . Abscess of head 01/09/2019  . [place]obe lesion, left 12/26/2018  . Type 2 diabetes mellitus with complication, without long-term current use of insulin ([place]) 12/26/2018  . Axillary hidradenitis suppurativa 10/13/2018  . Dizziness 09/21/2018  . Lower extremity edema 09/21/2018  . Irregular cardiac rhythm 08/25/2018  . Chronic recurrent sinusitis   . Alopecia of scalp 07/25/2018  . Bruising 06/21/2018  . Weight loss 06/21/2018  . Itching 06/21/2018  . Atopic dermatitis 03/20/2018  . Seasonal allergies 02/25/2017  . Diarrhea   . Chronic  obstructive pulmonary disease with bronchospasm ([place]) 12/24/2014  . QT prolongation 08/08/2014  . Dyslipidemia associated with type 2 diabetes mellitus ([facility]) 06/14/2014  . Hypokalemia 12/20/2012  . History of breast cancer 12/01/2012  . Primary osteoarthritis of right knee 09/29/2012  . GAD (generalized anxiety disorder) 12/30/2009  . Type 2 diabetes mellitus ([place]) 05/14/2009  . Tobacco abuse 04/06/2009  . Hypertension, benign essential, goal below 140/90 04/06/2009    Past Surgical History:  Procedure Laterality Date  . ABDOMINAL  HYSTERECTOMY    . ANTERIOR CERVICAL DECOMP/DISCECTOMY FUSION  2012   "Dr. Lynann Bologna  put plate in; did something to my vertebrae"  . BACK SURGERY    . BREAST LUMPECTOMY Right 1991  . DOBUTAMINE STRESS ECHO  08/2004   Inferior ischemia, normal LV systolic function, no significant CAD     OB History   No obstetric history on file.      Home Medications    Prior to Admission medications   Medication Sig Start Date End Date Taking? Authorizing Provider  acetaminophen (TYLENOL) 500 MG tablet Take 1,000 mg by mouth every 6 (six) hours as needed for headache.    [provider]  albuterol (VENTOLIN HFA) 108 (90 Base) MCG/ACT inhaler Inhale 2 puffs into the lungs every 6 (six) hours as needed for wheezing or shortness of breath. 03/14/19   [name], MD  amLODipine (NORVASC) 10 MG tablet Take 1 tablet (10 mg total) by mouth daily. 03/14/19   [name], MD  benzonatate (TESSALON) 100 MG capsule Take 1 capsule (100 mg total) by mouth 3 (three) times daily as needed for cough. 03/22/19   [name], [name], MD  Blood Glucose Monitoring Suppl (ONETOUCH VERIO) w/Device KIT Use to check blood sugar once daily. (E11.8) 03/28/19   [name], MD  cetirizine (ZYRTEC) 10 MG tablet Take 1 tablet (10 mg total) by mouth daily. Patient not taking: Reported on 03/19/2019 03/14/19   [name], MD  dextromethorphan-guaiFENesin [facility] DM) 30-600 MG 12hr tablet Take 1 tablet by mouth 2 (two) times daily. 03/22/19   [name], [name], MD  DULoxetine (CYMBALTA) 60 MG capsule Take 1 capsule (60 mg total) by mouth daily. 03/14/19   [name], MD  fluticasone (FLONASE) 50 MCG/ACT nasal spray Place 1 spray into both nostrils daily as needed for allergies or rhinitis. 03/22/19 03/21/20  [name], [name], MD  Fluticasone-Salmeterol (ADVAIR) 250-50 MCG/DOSE AEPB Inhale 1 puff into the lungs 2 (two) times daily. 03/14/19   [name], MD  glucose blood (ONETOUCH VERIO)  test strip Use to check blood sugar once daily. (E11.8) 03/28/19   [name], MD  hydrochlorothiazide (HYDRODIURIL) 25 MG tablet Take 1 tablet (25 mg total) by mouth daily. 03/14/19   [name], MD  ibuprofen (ADVIL) 800 MG tablet Take 1 tablet (800 mg total) by mouth every 8 (eight) hours as needed for moderate pain (Take with food). 03/22/19   [name], [name], MD  ipratropium-albuterol (DUONEB) 0.5-2.5 (3) MG/3ML SOLN Take 3 mLs by nebulization every 6 (six) hours as needed. 02/13/19   [name], MD  losartan (COZAAR) 100 MG tablet Take 1 tablet (100 mg total) by mouth daily. 03/14/19   [name], MD  metFORMIN (GLUCOPHAGE) 1000 MG tablet Take 1 tablet (1,000 mg total) by mouth 2 (two) times daily. 03/14/19   [name], MD  metoprolol tartrate (LOPRESSOR) 25 MG tablet Take 1 tablet (25 mg total) by mouth 2 (two) times daily.  03/14/19   [name], MD  OneTouch Delica Lancets 64P MISC Use to check blood sugar once daily (E11.8) 03/28/19   [name], MD  simvastatin (ZOCOR) 40 MG tablet Take 1 tablet (40 mg total) by mouth daily at 6 PM. 03/14/19   [name], MD  Tiotropium Bromide Monohydrate (SPIRIVA RESPIMAT) 2.5 MCG/ACT AERS Inhale 2 puffs into the lungs daily. 03/26/19   Forde Dandy, PharmD    Family History Family History  Problem Relation Age of Onset  . Cancer Mother     Social History Social History   Tobacco Use  . Smoking status: Current Every Day Smoker    Packs/day: 0.50    Years: 45.00    Pack years: 22.50    Types: Cigarettes  . Smokeless tobacco: Never Used  Substance Use Topics  . Alcohol use: Yes    Alcohol/week: 4.0 standard drinks    Types: 4 Cans of beer per week    Comment: 08/16/2018 "weekends only"  . Drug use: No     Allergies   Ace inhibitors and Flonase [fluticasone]   Review of Systems Review of Systems  Respiratory: Positive for shortness of breath.   All other systems reviewed and are  negative.    Physical Exam Updated Vital Signs BP (!) 174/87   Pulse (!) 110   Temp 99 F (37.2 C) (Oral)   Resp (!) 26   Ht '5\' 6"'  (1.676 m)   Wt 91.7 kg   SpO2 96%   BMI 32.63 kg/m   Physical Exam Vitals signs and nursing note reviewed.  Constitutional:      General: She is not in acute distress.    Appearance: She is well-developed.     Comments: Patient is tearful, in moderate respiratory discomfort.  HENT:     Head: Atraumatic.  Eyes:     Conjunctiva/sclera: Conjunctivae normal.  Neck:     Musculoskeletal: Neck supple.     Vascular: No JVD.  Cardiovascular:     Rate and Rhythm: Tachycardia present.  Pulmonary:     Effort: Tachypnea and accessory muscle usage present.     Breath sounds: No stridor. Decreased breath sounds and wheezing present. No rhonchi or rales.  Abdominal:     Palpations: Abdomen is soft.  Musculoskeletal:     Right lower leg: No edema.     Left lower leg: No edema.  Skin:    Findings: No rash.  Neurological:     Mental Status: She is alert and oriented to person, place, and time.  Psychiatric:        Mood and Affect: Mood is anxious.      ED Treatments / Results  Labs (all labs ordered are listed, but only abnormal results are displayed) Labs Reviewed  BASIC METABOLIC PANEL - Abnormal; Notable for the following components:      Result Value   Potassium 2.9 (*)    Chloride 95 (*)    Glucose, Bld 120 (*)    Calcium 8.6 (*)    All other components within normal limits  SARS CORONAVIRUS 2 (HOSPITAL ORDER, [facility])  BRAIN NATRIURETIC PEPTIDE  CBC WITH DIFFERENTIAL/PLATELET    EKG None  ED ECG REPORT   Date: 04/06/2019  Rate: 123  Rhythm: sinus tachycardia  QRS Axis: normal  Intervals: QT prolonged  ST/T Wave abnormalities: nonspecific ST changes  Conduction Disutrbances:none  Narrative Interpretation:   Old EKG Reviewed: unchanged  I have personally  reviewed the EKG tracing and agree with  the computerized printout as noted.   Radiology Dg Chest Port 1 View  Result Date: 04/06/2019 CLINICAL DATA:  Worsening shortness of breath today. Recent hospitalization for COPD exacerbation. History of diabetes and breast cancer. EXAM: PORTABLE CHEST 1 VIEW COMPARISON:  Radiographs 03/19/2019 and 02/18/2019. FINDINGS: 0754 hours. The heart size and mediastinal contours are normal. The lungs are clear. There is no pleural effusion or pneumothorax. No acute osseous findings are identified. There are surgical clips in the right axilla, multiple right-sided rib fractures and postsurgical changes in the lower cervical spine. Telemetry leads overlie the chest. IMPRESSION: Stable chest.  No active cardiopulmonary process. Electronically Signed   By: Richardean Sale M.D.   On: 04/06/2019 08:35    Procedures .Critical Care Performed by: Domenic Moras, PA-C Authorized by: Domenic Moras, PA-C   Critical care provider statement:    Critical care time (minutes):  45   Critical care was time spent personally by me on the following activities:  Discussions with consultants, evaluation of patient's response to treatment, examination of patient, ordering and performing treatments and interventions, ordering and review of laboratory studies, ordering and review of radiographic studies, pulse oximetry, re-evaluation of patient's condition, obtaining history from patient or surrogate and review of old charts   (including critical care time)  Medications Ordered in ED Medications  0.9 %  sodium chloride infusion ( Intravenous Stopped 04/06/19 1054)  albuterol (VENTOLIN HFA) 108 (90 Base) MCG/ACT inhaler 8 puff (8 puffs Inhalation Given 04/06/19 1140)  benzonatate (TESSALON) capsule 100 mg (has no administration in time range)  methylPREDNISolone sodium succinate (SOLU-MEDROL) 125 mg/2 mL injection 125 mg (125 mg Intravenous Given 04/06/19 0831)  magnesium sulfate IVPB 2 g 50 mL (0 g Intravenous Stopped 04/06/19 1054)   potassium chloride SA (K-DUR) CR tablet 40 mEq (40 mEq Oral Given 04/06/19 0954)     Initial Impression / Assessment and Plan / ED Course  I have reviewed the triage vital signs and the nursing notes.  Pertinent labs & imaging results that were available during my care of the patient were reviewed by me and considered in my medical decision making (see chart for details).        BP (!) 174/87   Pulse (!) 110   Temp 99 F (37.2 C) (Oral)   Resp (!) 26   Ht '5\' 6"'  (1.676 m)   Wt 91.7 kg   SpO2 96%   BMI 32.63 kg/m    Final Clinical Impressions(s) / ED Diagnoses   Final diagnoses:  COPD exacerbation [employer])    ED Discharge Orders    None     8:16 AM Patient with significant history of COPD presenting here with shortness of breath.  She appears to be in moderate respiratory discomfort, however not hypoxic.  She does have wheezes on exam, she is tachypneic and tachycardic.  She was hospitalized 2 weeks ago for similar presentation.  Her covid-19 test at that time was negative.  She denies any recent sick contact.  Will recheck a COVID test and will provide symptomatic treatment.  Anticipate hospitalization.  11:45 AM Patient report minimal improvement of symptoms despite receiving albuterol, Solu-Medrol, magnesium and supplemental oxygenation.  When ambulating O2 sats dropped down to 92% and patient became increasingly tachypneic and tachycardic.  She has persistent cough.  Repeat COVID-19 test is negative.  Labs otherwise reassuring.  EKG troponin without acute changes.  Will consult for admission for COPD exacerbation.  Care  discussed with Dr. Zenia Resides.  11:52 AM Appreciate consultation from [place] DR. Shahmehdi, who agrees to see and admit pt for further care.   [name] was evaluated in Emergency Department on 04/06/2019 for the symptoms described in the history of present illness. She was evaluated in the context of the global COVID-19 pandemic, which  necessitated consideration that the patient might be at risk for infection with the SARS-CoV-2 virus that causes COVID-19. Institutional protocols and algorithms that pertain to the evaluation of patients at risk for COVID-19 are in a state of rapid change based on information released by regulatory bodies including the CDC and federal and state organizations. These policies and algorithms were followed during the patient's care in the ED.    Domenic Moras, PA-C 04/06/19 1153    Lacretia Leigh, MD 04/08/19 [phone]

## 2019-04-06 NOTE — H&P (Signed)
History and Physical   Patient: [name]                            PCP: Mosetta Anis, MD                    DOB: [birth year]            DOA: 04/06/2019 [id number]:[id number]             DOS: 04/06/2019, 12:14 PM  Patient coming from:   Home  I have personally reviewed patient's medical records, in electronic medical records, including: [place] link, and care everywhere.   Chief Complaint: Shortness of breath  Present illness:  [name] is a 68 y.o. female with medical history significant of COPD/HTN/ HLD//depression/arthritis/history of right breast cancer , was hospitalized 2 weeks ago for COPD exacerbation, once again presenting with progressive shortness of breath, cough, sputum production. Dating post hospitalization her symptoms number quite improved, her shortness of breath and wheezing, congestion, and  worsened over past 3-4 days.  Stating " cannot catch her breath" also has been having productive cough, reporting the color is clear white to yellowish.  Her last hospitalization COVID-19 testing was negative,  Otherwise currently stable aside from shortness of breath, wheezing, cough.  Denies having any fever or chills.  Denies any chest pain.  Denies of having any nausea vomiting constipation or diarrhea.  Denies of having any headaches visual change or asymmetric weaknesses.  Denies of having any dysuria.  Denies of any joint pain.  Denies any open wounds or rash.   ED Course:  Was evaluated in ED, was found satting 92% Was treated with DuoNeb bronchodilators, patient persistently was tachypneic, tachycardic, persistently wheezing unable to complete sentences with assistance shortness of breath. Hypokalemic with potassium of 2.9, WBC of 9.4, Again was treated with DuoNeb, Tessalon Perles, IV Solu-Medrol 125 mg, Po 40 meq KCl X-ray -no infiltrate was noted  SARS-CoV-2 target nucleic acids are NOT DETECTED.  Was requested for the  patient to be admitted due to  persistent shortness of breath, wheezing, tachycardia, tachypnea   Review of Systems: As per HPI otherwise 10 point review of systems negative.   ---------------------------------------------------------------------------------------------------------------------------------------------  Past Medical History:  Diagnosis Date  . Anxiety   . Arthritis    "fingers, knees" (08/16/2018)  . Asthma   . Cancer of right breast ([place]) 1991   s/p lumpectomy, chemotherapy and radiation therapy in 1991. Mammogram in 2007 was normal.  . Constipated    h/o  . COPD (chronic obstructive pulmonary disease) ([employer])    History of multiple hospital admissions for exercabation   . COPD exacerbation ([place]) 01/01/2019  . COPD with acute exacerbation ([place]) 01/14/2019  . COPD with exacerbation ([place]) 04/06/2009   Qualifier: Diagnosis of  By: Eyvonne Mechanic MD, Vijay    . Depression   . Diarrhea    h/o  . GERD (gastroesophageal reflux disease)   . Headache    "a few times/month" (08/16/2018  . Heart murmur 10/05/11   "first time I ever heard I had one was today"  . Hyperlipidemia   . Hypertension   . Obesity   . Personal history of chemotherapy   . Personal history of radiation therapy   . Pneumonia    "couple times in the last 10-15 yrs" (08/16/2018)  . Shortness of breath 10/05/11   "at rest; lying down; w/exertion"  . Sigmoid  diverticulitis 80/2008  . Tobacco abuse   . Type II diabetes mellitus ([place])     Past Surgical History:  Procedure Laterality Date  . ABDOMINAL HYSTERECTOMY    . ANTERIOR CERVICAL DECOMP/DISCECTOMY FUSION  2012   "Dr. Lynann Bologna  put plate in; did something to my vertebrae"  . BACK SURGERY    . BREAST LUMPECTOMY Right 1991  . DOBUTAMINE STRESS ECHO  08/2004   Inferior ischemia, normal LV systolic function, no significant CAD     reports that she has been smoking cigarettes. She has a 22.50 pack-year smoking history. She has never used smokeless tobacco. She reports current alcohol use  of about 4.0 standard drinks of alcohol per week. She reports that she does not use drugs.  Allergies  Allergen Reactions  . Ace Inhibitors Swelling    Throat swelling. Lisinopril-HCTZ Pt states she in not allergic   . Flonase [Fluticasone] Other (See Comments)    Sinuses stop up and condition worsens    Family History  Problem Relation Age of Onset  . Cancer Mother      Prior to Admission medications   Medication Sig Start Date End Date Taking? Authorizing Provider  albuterol (VENTOLIN HFA) 108 (90 Base) MCG/ACT inhaler Inhale 2 puffs into the lungs every 6 (six) hours as needed for wheezing or shortness of breath. 03/14/19  Yes [name], MD  amLODipine (NORVASC) 10 MG tablet Take 1 tablet (10 mg total) by mouth daily. 03/14/19  Yes [name], MD  benzonatate (TESSALON) 100 MG capsule Take 1 capsule (100 mg total) by mouth 3 (three) times daily as needed for cough. 03/22/19  Yes [name], [name], MD  dextromethorphan-guaiFENesin (MUCINEX DM) 30-600 MG 12hr tablet Take 1 tablet by mouth 2 (two) times daily. 03/22/19  Yes [name], [name], MD  DULoxetine (CYMBALTA) 60 MG capsule Take 1 capsule (60 mg total) by mouth daily. 03/14/19  Yes [name], MD  fluticasone (FLONASE) 50 MCG/ACT nasal spray Place 1 spray into both nostrils daily as needed for allergies or rhinitis. 03/22/19 03/21/20 Yes [name], [name], MD  Fluticasone-Salmeterol (ADVAIR) 250-50 MCG/DOSE AEPB Inhale 1 puff into the lungs 2 (two) times daily. 03/14/19  Yes [name], MD  hydrochlorothiazide (HYDRODIURIL) 25 MG tablet Take 1 tablet (25 mg total) by mouth daily. 03/14/19  Yes [name], MD  ibuprofen (ADVIL) 800 MG tablet Take 1 tablet (800 mg total) by mouth every 8 (eight) hours as needed for moderate pain (Take with food). 03/22/19  Yes [name], [name], MD  ipratropium-albuterol (DUONEB) 0.5-2.5 (3) MG/3ML SOLN Take 3 mLs by nebulization every 6 (six) hours as needed.  02/13/19  Yes [name], MD  losartan (COZAAR) 100 MG tablet Take 1 tablet (100 mg total) by mouth daily. 03/14/19  Yes [name], MD  metFORMIN (GLUCOPHAGE) 1000 MG tablet Take 1 tablet (1,000 mg total) by mouth 2 (two) times daily. 03/14/19  Yes [name], MD  metoprolol tartrate (LOPRESSOR) 25 MG tablet Take 1 tablet (25 mg total) by mouth 2 (two) times daily. 03/14/19  Yes [name], MD  simvastatin (ZOCOR) 40 MG tablet Take 1 tablet (40 mg total) by mouth daily at 6 PM. 03/14/19  Yes [name], MD  Blood Glucose Monitoring Suppl [facility] VERIO) w/Device KIT Use to check blood sugar once daily. (E11.8) 03/28/19   [name], MD  cetirizine (ZYRTEC) 10 MG tablet Take 1 tablet (10 mg total) by mouth daily. Patient not  taking: Reported on 03/19/2019 03/14/19   [name], MD  glucose blood [facility] VERIO) test strip Use to check blood sugar once daily. (E11.8) 03/28/19   [name], MD  OneTouch Delica Lancets 73A MISC Use to check blood sugar once daily (E11.8) 03/28/19   [name], MD  Tiotropium Bromide Monohydrate (SPIRIVA RESPIMAT) 2.5 MCG/ACT AERS Inhale 2 puffs into the lungs daily. 03/26/19   Forde Dandy, PharmD    Physical Exam:  Constitutional: [place],  alert able to communicate but unable to complete full sentences, persistently shortness of breath, coughing Tachypneic, using accessory muscles, audibly wheezing, tachycardic  Vitals:   04/06/19 0900 04/06/19 1000 04/06/19 1030 04/06/19 1138  BP: (!) 148/73 (!) 174/87 (!) 153/74 138/60  Pulse: (!) 110 (!) 110 (!) 108 (!) 122  Resp: (!) 26 (!) 26 (!) 21 (!) 36  Temp:      TempSrc:      SpO2: 100% 96% 99% 100%  Weight:      Height:       Eyes: PERRL, lids and conjunctivae normal ENMT: Mucous membranes are moist. Posterior pharynx clear of any exudate or lesions.Normal dentition.  Neck: normal, supple, no masses, no thyromegaly Respiratory: Positive breathing sounds  laterally, diffusely wheezing, positive for rhonchi, no crackles.  Assessment respiratory effort. No accessory muscle use.  Cardiovascular: Cardiac, sinus rhythm , no murmurs / rubs / gallops. No extremity edema. 2+ pedal pulses. No carotid bruits.  Abdomen: no tenderness, no masses palpated. No hepatosplenomegaly. Bowel sounds positive.  Musculoskeletal: no clubbing / cyanosis. No joint deformity upper and lower extremities. Good ROM, no contractures. Normal muscle tone.  Skin: no rashes, lesions, ulcers. No induration Neurologic: CN 2-12 grossly intact. Sensation intact, DTR normal. Strength 5/5 in all 4.  Psychiatric: Normal judgment and insight. Alert and oriented x 3. Normal mood.     Labs on Admission: I have personally reviewed following labs and imaging studies  CBC: Recent Labs  Lab 04/06/19 0845  WBC 9.4  NEUTROABS 6.4  HGB 13.0  HCT 38.5  MCV 93.4  PLT 193   Basic Metabolic Panel: Recent Labs  Lab 04/06/19 0843  NA 136  K 2.9*  CL 95*  CO2 29  GLUCOSE 120*  BUN 9  CREATININE 0.72  CALCIUM 8.6*   Urine analysis:    Component Value Date/Time   COLORURINE YELLOW 07/23/2016 0028   APPEARANCEUR CLEAR 07/23/2016 0028   LABSPEC 1.016 07/23/2016 0028   PHURINE 6.0 07/23/2016 0028   GLUCOSEU >1000 (A) 07/23/2016 0028   HGBUR NEGATIVE 07/23/2016 0028   BILIRUBINUR NEGATIVE 07/23/2016 0028   KETONESUR NEGATIVE 07/23/2016 0028   PROTEINUR NEGATIVE 07/23/2016 0028   UROBILINOGEN 1.0 02/15/2012 1208   NITRITE NEGATIVE 07/23/2016 0028   LEUKOCYTESUR NEGATIVE 07/23/2016 0028    Radiological Exams on Admission: Dg Chest Port 1 View  Result Date: 04/06/2019 CLINICAL DATA:  Worsening shortness of breath today. Recent hospitalization for COPD exacerbation. History of diabetes and breast cancer. EXAM: PORTABLE CHEST 1 VIEW COMPARISON:  Radiographs 03/19/2019 and 02/18/2019. FINDINGS: 0754 hours. The heart size and mediastinal contours are normal. The lungs are clear.  There is no pleural effusion or pneumothorax. No acute osseous findings are identified. There are surgical clips in the right axilla, multiple right-sided rib fractures and postsurgical changes in the lower cervical spine. Telemetry leads overlie the chest. IMPRESSION: Stable chest.  No active cardiopulmonary process. Electronically Signed   By: Richardean Sale M.D.   On: 04/06/2019 08:35  EKG: Independently reviewed.  Area rate 123, sinus, probable elongation 505, for any ST elevation depression    Assessment / Plan:   Principal Problem:   COPD exacerbation (HCC) Active Problems:   Type 2 diabetes mellitus (HCC)   GAD (generalized anxiety disorder)   Hypertension, benign essential, goal below 140/90   History of breast cancer   Dyslipidemia associated with type 2 diabetes mellitus (HCC)   Chronic recurrent sinusitis   Type 2 diabetes mellitus with complication, without long-term current use of insulin (HCC)   Anxiety and depression   Principal Problem:  Acute respiratory failure/ hypoxia / COPD exacerbation (HCC)/bronchitis chronic sinusitis -Clearly in respiratory distress: Shortness of breath, wheezing, tachypneic, tachycardic -Will be admitted to telemetry bed -Continue with scheduled DuoNeb bronchodilators, will substitute albuterol with Xopenex due to tachycardia -1 dose of Solu-Medrol IV was given will be continued at 60 mg IV every 6 hours -We will monitor closely, will maintain her O2 greater than 92% with supplemental oxygen -Pulmonary toiletry, sent of a spirometer, flutter valve -Mucolytic's -Empiric p.o. antibiotics of doxycycline (avoiding quinolones due to QTC and prolongation) -Reviewed, negative for any infiltrate  Addendum: -Patient's oxygenation improving, currently on room air 94% Remained to be excessively wheezing with shortness of breath " hard time catching my breath"  Tachycardic/prolonged QT interval -Likely exacerbated by DuoNeb bronchodilator  treatment, albuterol, hypokalemia -EKG was reviewed personally, QTC 505 -Repleting potassium and magnesium -Substituting albuterol to Xopenex -We will monitor closely -voiding medication to exacerbate QT prolongation     Type II diabetes mellitus ([place]) -Holding home medication including metformin -We will check her blood sugar QA CHS, SSI -Checking hemoglobin A1c -Dissipating blood sugars run high due to steroids   Hypertension, benign essential, goal below 140/90 -Stable -Continue home meds including Norvasc, HCTZ (due to electrolyte abnormality, hypokalemia)  Hypokalemia -Potassium 2.9 -We will replete accordingly, checking magnesium and depleting accordingly     Dyslipidemia  -Continue statins  ? History of MI /coronary artery disease -They will continue home medication: Aspirin, statins, beta-blocker -Last stress test 1005 dobutamine stress echo reporting inferior ischemia, normal LV function, no significant CAD    Anxiety and depression  -They will continue home medication    History of breast cancer -In remission, may follow-up with PCP in oncology as an outpatient  -s/p lumpectomy, chemo and radiation 1991, last mammography 2007 within normal limits  GERD  -Initiating Protonix   DVT prophylaxis: Heparin subcu  Code Status:   Code Status: Full Code  Family Communication: None were present at bedside  The above findings and plan of care has been discussed with patient  in detail, they expressed understanding and agreement of above plan.   Disposition Plan: Anticipate patient to be discharged home once medically stable  Consults called: None  Admission status: Patient will be admitted to telemetry bed.  Meets admission criteria due to acute tori failure, hypoxia, excessive shortness of breath, needing a breathing treatment, likely O2 via nasal cannula, steroids for severe bronchospasm, wheezing. Anticipating the patient to stay at least 2 midnight.     Antimicrobial -Empiric antibiotic doxycycline 5/29  Cultures: -We will attempt to obtain sputum cultures    --------------------------------------------------------------------------------------------------------------------------------------------   A.  MD, FACP, FHM. [facility],  Pager [phone]  If 7PM-7AM, please contact night-coverage [email or web address], Password [email or web address] @, [email or web address] @  04/06/2019, 12:13 PM

## 2019-04-06 NOTE — ED Notes (Signed)
Pt ambulated without oxygen 92% a lot of wheezing and had a hard time catching her breath.

## 2019-04-06 NOTE — Progress Notes (Signed)
Pharmacy: heparin SQ for VTE px  Patient's a 68 y.o F with hx COPD presented to the ED on 5/29 with SOB.  To start heparin SQ for VTE prophylaxis.  Plan: - heparin 5000 units SQ q8h - pharmacy will sign off. Re-consult [place] if need further assistance.  Thank you for asking pharmacy to participate in this pt's care.  Dia Sitter, PharmD, BCPS 04/06/2019 12:04 PM

## 2019-04-06 NOTE — ED Notes (Signed)
Pure wick has been placed. Pt has been educated on pure wick. Suction set to 45mmHg 

## 2019-04-06 NOTE — Evaluation (Signed)
Occupational Therapy Evaluation Patient Details Name: [name] MRN: [id number] DOB: 1951-08-21 Today's Date: 04/06/2019    History of Present Illness 68 y.o. female with medical history significant of COPD/HTN/ HLD//depression/arthritis/history of right breast cancer , was hospitalized 2 weeks ago for COPD exacerbation and admitted 04/06/19 for acute respiratory failure and COPD exacerbation   Clinical Impression   This 68 y/o female presents with the above. PTA pt reports independence with ADL and functional mobility, lives with spouse. Pt performing functional mobility without AD and overall minA today. She currently requires minA for standing grooming ADL, modA for LB ADL. Pt fatigued with activity but with good motivation to return to PLOF. O2 sats maintaining >94% on RA with activity. She will benefit from continued acute OT services and recommend follow up therapy services in [facility] setting after discharge to maximize her safety and independence with ADL and mobility. Will follow.     Follow Up Recommendations  Home health OT;Supervision/Assistance - 24 hour    Equipment Recommendations  Tub/shower seat           Precautions / Restrictions Precautions Precautions: None Restrictions Weight Bearing Restrictions: No      Mobility Bed Mobility Overal bed mobility: Needs Assistance Bed Mobility: Supine to Sit;Sit to Supine     Supine to sit: Supervision;HOB elevated Sit to supine: Supervision   General bed mobility comments: effortful however no assist required, SPO2 98% room air at rest  Transfers Overall transfer level: Needs assistance Equipment used: None Transfers: Sit to/from Stand Sit to Stand: Min guard         General transfer comment: min/guard for safety    Balance Overall balance assessment: Needs assistance         Standing balance support: No upper extremity supported Standing balance-Leahy Scale: Fair                            ADL either performed or assessed with clinical judgement   ADL Overall ADL's : Needs assistance/impaired Eating/Feeding: Modified independent;Sitting   Grooming: Minimal assistance;Min guard;Wash/dry face;Wash/dry hands;Standing   Upper Body Bathing: Set up;Min guard;Sitting   Lower Body Bathing: Minimal assistance;Moderate assistance;Sit to/from stand   Upper Body Dressing : Set up;Min guard;Sitting   Lower Body Dressing: Moderate assistance;Sit to/from stand   Toilet Transfer: Minimal assistance;Ambulation Toilet Transfer Details (indicate cue type and reason): simulated via transfer to/from EOB, room and hallway mobility Toileting- Clothing Manipulation and Hygiene: Minimal assistance;Sit to/from stand       Functional mobility during ADLs: Minimal assistance(HHA)       Vision         Perception     Praxis      Pertinent Vitals/Pain Pain Assessment: Faces Faces Pain Scale: Hurts little more Pain Location: ribs (reports hx of "broken ribs" from coughing/sneezing) Pain Descriptors / Indicators: Sore Pain Intervention(s): Monitored during session;Repositioned     Hand Dominance     Extremity/Trunk Assessment Upper Extremity Assessment Upper Extremity Assessment: Generalized weakness   Lower Extremity Assessment Lower Extremity Assessment: Defer to PT evaluation   Cervical / Trunk Assessment Cervical / Trunk Assessment: Normal   Communication Communication Communication: No difficulties   Cognition Arousal/Alertness: Awake/alert Behavior During Therapy: WFL for tasks assessed/performed Overall Cognitive Status: Within Functional Limits for tasks assessed  General Comments  O2 sats >94% on RA with activity    Exercises     Shoulder Instructions      Home Living Family/patient expects to be discharged to:: Private residence Living Arrangements: Spouse/significant other;Children Available Help at  Discharge: Family Type of Home: House Home Access: Stairs to enter Technical brewer of Steps: 2 Entrance Stairs-Rails: None Home Layout: Multi-level Alternate Level Stairs-Number of Steps: 3 Alternate Level Stairs-Rails: None Bathroom Shower/Tub: Teacher, early years/pre: Standard     Home Equipment: None          Prior Functioning/Environment Level of Independence: Independent                 OT Problem List: Decreased strength;Decreased range of motion;Decreased activity tolerance;Impaired balance (sitting and/or standing);Cardiopulmonary status limiting activity      OT Treatment/Interventions: Self-care/ADL training;Therapeutic exercise;Neuromuscular education;Therapeutic activities;Patient/family education;Balance training;Energy conservation    OT Goals(Current goals can be found in the care plan section) Acute Rehab OT Goals Patient Stated Goal: to be healthy and independent for the birth of her first great grandchild  OT Goal Formulation: With patient Time For Goal Achievement: 04/20/19 Potential to Achieve Goals: Good  OT Frequency: Min 2X/week   Barriers to D/C:            Co-evaluation PT/OT/SLP Co-Evaluation/Treatment: Yes Reason for Co-Treatment: For patient/therapist safety;To address functional/ADL transfers PT goals addressed during session: Mobility/safety with mobility OT goals addressed during session: ADL's and self-care      AM-PAC OT "6 Clicks" Daily Activity     Outcome Measure Help from another person eating meals?: None Help from another person taking care of personal grooming?: A Little Help from another person toileting, which includes using toliet, bedpan, or urinal?: A Little Help from another person bathing (including washing, rinsing, drying)?: A Little Help from another person to put on and taking off regular upper body clothing?: None Help from another person to put on and taking off regular lower body  clothing?: A Lot 6 Click Score: 19   End of Session Nurse Communication: Mobility status  Activity Tolerance: Patient tolerated treatment well Patient left: in bed;with call bell/phone within reach  OT Visit Diagnosis: Muscle weakness (generalized) (M62.81);Unsteadiness on feet (R26.81)                Time: 5329-9242 OT Time Calculation (min): 20 min Charges:  OT General Charges $OT Visit: 1 Visit OT Evaluation $OT Eval Moderate Complexity: Taylor, OT [employer] Pager [phone] Office [phone]   Raymondo Band 04/06/2019, 4:19 PM

## 2019-04-06 NOTE — ED Provider Notes (Signed)
Medical screening examination/treatment/procedure(s) were conducted as a shared visit with non-physician practitioner(s) and myself.  I personally evaluated the patient during the encounter.  None 68 year old female presents with shortness of breath similar to her COPD exacerbations.  Chest x-ray without acute findings.  Will likely admit for further management   Lacretia Leigh, MD 04/06/19 [phone]

## 2019-04-06 NOTE — ED Notes (Signed)
X-ray at bedside

## 2019-04-06 NOTE — Telephone Encounter (Signed)
She is in the ED

## 2019-04-07 LAB — CBC
HCT: 35.4 % — ABNORMAL LOW (ref 36.0–46.0)
Hemoglobin: 11.7 g/dL — ABNORMAL LOW (ref 12.0–15.0)
MCH: 31 pg (ref 26.0–34.0)
MCHC: 33.1 g/dL (ref 30.0–36.0)
MCV: 93.9 fL (ref 80.0–100.0)
Platelets: 282 10*3/uL (ref 150–400)
RBC: 3.77 MIL/uL — ABNORMAL LOW (ref 3.87–5.11)
RDW: 12.5 % (ref 11.5–15.5)
WBC: 10.7 10*3/uL — ABNORMAL HIGH (ref 4.0–10.5)
nRBC: 0 % (ref 0.0–0.2)

## 2019-04-07 LAB — BASIC METABOLIC PANEL
Anion gap: 11 (ref 5–15)
BUN: 18 mg/dL (ref 8–23)
CO2: 26 mmol/L (ref 22–32)
Calcium: 8.5 mg/dL — ABNORMAL LOW (ref 8.9–10.3)
Chloride: 96 mmol/L — ABNORMAL LOW (ref 98–111)
Creatinine, Ser: 0.76 mg/dL (ref 0.44–1.00)
GFR calc Af Amer: >60 mL/min (ref >60)
GFR calc non Af Amer: >60 mL/min (ref >60)
Glucose, Bld: 246 mg/dL — ABNORMAL HIGH (ref 70–99)
Potassium: 3.6 mmol/L (ref 3.5–5.1)
Sodium: 133 mmol/L — ABNORMAL LOW (ref 135–145)

## 2019-04-07 LAB — PROTIME-INR
INR: 1 (ref 0.8–1.2)
Prothrombin Time: 12.8 seconds (ref 11.4–15.2)

## 2019-04-07 LAB — APTT: aPTT: 29 seconds (ref 24–36)

## 2019-04-07 LAB — GLUCOSE, CAPILLARY
Glucose-Capillary: 228 mg/dL — ABNORMAL HIGH (ref 70–99)
Glucose-Capillary: 306 mg/dL — ABNORMAL HIGH (ref 70–99)

## 2019-04-07 MED ORDER — IBUPROFEN 800 MG PO TABS
800.0000 mg | ORAL_TABLET | Freq: Three times a day (TID) | ORAL | Status: DC | PRN
  Administered 2019-04-07: 800 mg via ORAL
  Filled 2019-04-07: qty 1

## 2019-04-07 NOTE — Progress Notes (Signed)
PROGRESS NOTE    [name]  FBP:[id number] DOB: 03/20/1951 DOA: 04/06/2019 PCP: Mosetta Anis, MD    Brief Narrative:  68 year old female with history of COPD, hypertension, hyperlipidemia, depression and anxiety, active smoker and recurrent hospitalization, recent hospitalization 2 weeks ago for COPD exacerbation presenting back to the hospital with cough wheezing and is stating that she cannot catch her breath.  In the emergency room patient is on room air.  Hypokalemia with potassium 2.9.  Patient was found with significant bronchospasm and admitted to the hospital for COPD exacerbation.   Assessment & Plan:   Principal Problem:   COPD exacerbation ([place]) Active Problems:   Type 2 diabetes mellitus (HCC)   GAD (generalized anxiety disorder)   Hypertension, benign essential, goal below 140/90   History of breast cancer   Dyslipidemia associated with type 2 diabetes mellitus (HCC)   Chronic recurrent sinusitis   Type 2 diabetes mellitus with complication, without long-term current use of insulin (HCC)   Anxiety and depression  COPD with acute exacerbation: Agree with continuous hospitalization because of severity of symptoms. Aggressive bronchodilator therapy, IV steroids, inhalational steroids, scheduled and as needed bronchodilators, deep breathing exercises, incentive spirometry, chest physiotherapy and respiratory therapy consult. Antibiotics due to severity of symptoms.  Patient on doxycycline. Supplemental oxygen to keep saturations more than 92%.  Type 2 diabetes: Patient on metformin at home.  Holding metformin.  On sliding scale insulin.  Hypertension: Blood pressures fairly stable.  Continue home medications including amlodipine and hydrochlorothiazide.  Hypokalemia: Replaced and improved.  GERD: On PPI.  Anxiety and depression: Significant component.  On Cymbalta she will continue.  Prolonged QT: Mostly recorded with sinus tachycardia.  Will need a repeat  EKG when her heart rate is controlled.   DVT prophylaxis: Subcu heparin Code Status: Full code Family Communication: None Disposition Plan: Home after hospitalization   Consultants:   None  Procedures:   None  Antimicrobials:   Doxycycline, 04/06/2019   Subjective: Patient was seen and examined at the bedside.  Admitted with COPD exacerbation.  Patient is very tearful and was very worried that I am going to discharge her right away without her wheezing improved. Detailed discussion done about ways to prevent from COPD exacerbations. Discussed extensively about smoking cessation and how it can continue to worsen her COPD.  Objective: Vitals:   04/06/19 2117 04/07/19 0137 04/07/19 0440 04/07/19 0747  BP: (!) 146/65  (!) 150/70   Pulse: 90  84 100  Resp: 20  18 17   Temp: 98.6 F (37 C)  98.4 F (36.9 C)   TempSrc: Oral  Oral   SpO2: 100% 97% 100% 99%  Weight:      Height:        Intake/Output Summary (Last 24 hours) at 04/07/2019 1117 Last data filed at 04/06/2019 2000 Gross per 24 hour  Intake 1017.88 ml  Output 700 ml  Net 317.88 ml   Filed Weights   04/06/19 0800 04/06/19 1312  Weight: 91.7 kg 90.3 kg    Examination:  General exam: Appears anxious.  Patient is on room air.  Hyperventilating on talking. Respiratory system: Bilateral expiratory wheezes.  Poor bilateral air entry. Cardiovascular system: S1 & S2 heard, RRR. No JVD, murmurs, rubs, gallops or clicks. No pedal edema. Gastrointestinal system: Abdomen is nondistended, soft and nontender. No organomegaly or masses felt. Normal bowel sounds heard.  Obese. Central nervous system: Alert and oriented. No focal neurological deficits. Extremities: Symmetric 5 x 5 power. Skin: No  rashes, lesions or ulcers Psychiatry: Judgement and insight appear normal. Mood & affect anxious.    Data Reviewed: I have personally reviewed following labs and imaging studies  CBC: Recent Labs  Lab 04/06/19 0845  04/07/19 0604  WBC 9.4 10.7*  NEUTROABS 6.4  --   HGB 13.0 11.7*  HCT 38.5 35.4*  MCV 93.4 93.9  PLT 330 673   Basic Metabolic Panel: Recent Labs  Lab 04/06/19 0843 04/06/19 0845 04/06/19 1330 04/07/19 0604  NA 136  --   --  133*  K 2.9*  --   --  3.6  CL 95*  --   --  96*  CO2 29  --   --  26  GLUCOSE 120*  --   --  246*  BUN 9  --   --  18  CREATININE 0.72  --   --  0.76  CALCIUM 8.6*  --  8.4* 8.5*  MG  --   --  2.0  --   PHOS  --  3.6  --   --    GFR: Estimated Creatinine Clearance: 76.2 mL/min (by C-G formula based on SCr of 0.76 mg/dL). Liver Function Tests: No results for input(s): AST, ALT, ALKPHOS, BILITOT, PROT, ALBUMIN in the last 168 hours. No results for input(s): LIPASE, AMYLASE in the last 168 hours. No results for input(s): AMMONIA in the last 168 hours. Coagulation Profile: Recent Labs  Lab 04/07/19 0604  INR 1.0   Cardiac Enzymes: No results for input(s): CKTOTAL, CKMB, CKMBINDEX, TROPONINI in the last 168 hours. BNP (last 3 results) No results for input(s): PROBNP in the last 8760 hours. HbA1C: Recent Labs    04/06/19 1330  HGBA1C 6.6*   CBG: Recent Labs  Lab 04/06/19 1353 04/06/19 1613 04/07/19 0740  GLUCAP 233* 340* 228*   Lipid Profile: No results for input(s): CHOL, HDL, LDLCALC, TRIG, CHOLHDL, LDLDIRECT in the last 72 hours. Thyroid Function Tests: No results for input(s): TSH, T4TOTAL, FREET4, T3FREE, THYROIDAB in the last 72 hours. Anemia Panel: No results for input(s): VITAMINB12, FOLATE, FERRITIN, TIBC, IRON, RETICCTPCT in the last 72 hours. Sepsis Labs: No results for input(s): PROCALCITON, LATICACIDVEN in the last 168 hours.  Recent Results (from the past 240 hour(s))  SARS Coronavirus 2 (CEPHEID - Performed in [place] hospital lab), Hosp Order     Status: None   Collection Time: 04/06/19  8:44 AM  Result Value Ref Range Status   SARS Coronavirus 2 NEGATIVE NEGATIVE Final    Comment: (NOTE) If result is NEGATIVE  SARS-CoV-2 target nucleic acids are NOT DETECTED. The SARS-CoV-2 RNA is generally detectable in upper and lower  respiratory specimens during the acute phase of infection. The lowest  concentration of SARS-CoV-2 viral copies this assay can detect is 250  copies / mL. A negative result does not preclude SARS-CoV-2 infection  and should not be used as the sole basis for treatment or other  patient management decisions.  A negative result may occur with  improper specimen collection / handling, submission of specimen other  than nasopharyngeal swab, presence of viral mutation(s) within the  areas targeted by this assay, and inadequate number of viral copies  (<250 copies / mL). A negative result must be combined with clinical  observations, patient history, and epidemiological information. If result is POSITIVE SARS-CoV-2 target nucleic acids are DETECTED. The SARS-CoV-2 RNA is generally detectable in upper and lower  respiratory specimens dur ing the acute phase of infection.  Positive  results  are indicative of active infection with SARS-CoV-2.  Clinical  correlation with patient history and other diagnostic information is  necessary to determine patient infection status.  Positive results do  not rule out bacterial infection or co-infection with other viruses. If result is PRESUMPTIVE POSTIVE SARS-CoV-2 nucleic acids MAY BE PRESENT.   A presumptive positive result was obtained on the submitted specimen  and confirmed on repeat testing.  While 2019 novel coronavirus  (SARS-CoV-2) nucleic acids may be present in the submitted sample  additional confirmatory testing may be necessary for epidemiological  and / or clinical management purposes  to differentiate between  SARS-CoV-2 and other Sarbecovirus currently known to infect humans.  If clinically indicated additional testing with an alternate test  methodology [id number]) is advised. The SARS-CoV-2 RNA is generally  detectable in upper  and lower respiratory sp ecimens during the acute  phase of infection. The expected result is Negative. Fact Sheet for Patients:  StrictlyIdeas.no Fact Sheet for Healthcare Providers: BankingDealers.co.za This test is not yet approved or cleared by the [place] FDA and has been authorized for detection and/or diagnosis of SARS-CoV-2 by FDA under an Emergency Use Authorization (EUA).  This EUA will remain in effect (meaning this test can be used) for the duration of the COVID-19 declaration under Section 564(b)(1) of the Act, 21 U.S.C. section 360bbb-3(b)(1), unless the authorization is terminated or revoked sooner. Performed at [facility], [place] [address], [place], [place] [address]   Culture, sputum-assessment     Status: None   Collection Time: 04/06/19  6:04 PM  Result Value Ref Range Status   Specimen Description SPU EXPECTORATED  Final   Special Requests NONE  Final   Sputum evaluation   Final    THIS SPECIMEN IS ACCEPTABLE FOR SPUTUM CULTURE Performed at [facility], [place] [address] [place], [place] [address]    Report Status 04/06/2019 FINAL  Final         Radiology Studies: Dg Chest Port 1 View  Result Date: 04/06/2019 CLINICAL DATA:  Worsening shortness of breath today. Recent hospitalization for COPD exacerbation. History of diabetes and breast cancer. EXAM: PORTABLE CHEST 1 VIEW COMPARISON:  Radiographs 03/19/2019 and 02/18/2019. FINDINGS: 0754 hours. The heart size and mediastinal contours are normal. The lungs are clear. There is no pleural effusion or pneumothorax. No acute osseous findings are identified. There are surgical clips in the right axilla, multiple right-sided rib fractures and postsurgical changes in the lower cervical spine. Telemetry leads overlie the chest. IMPRESSION: Stable chest.  No active cardiopulmonary process. Electronically Signed   By: Richardean Sale  M.D.   On: 04/06/2019 08:35        Scheduled Meds: . amLODipine  10 mg Oral Daily  . atorvastatin  20 mg Oral q1800  . docusate sodium  100 mg Oral BID  . doxycycline  100 mg Oral Q12H  . DULoxetine  60 mg Oral Daily  . guaiFENesin-dextromethorphan  10 mL Oral Q6H  . heparin  5,000 Units Subcutaneous Q8H  . insulin aspart  0-15 Units Subcutaneous TID WC  . levalbuterol  0.63 mg Nebulization Q6H  . loratadine  10 mg Oral Daily  . methylPREDNISolone (SOLU-MEDROL) injection  60 mg Intravenous Q6H  . metoprolol tartrate  25 mg Oral BID  . sodium chloride flush  3 mL Intravenous Q12H  . umeclidinium bromide  1 puff Inhalation Daily   Continuous Infusions: . sodium chloride Stopped (04/06/19 1756)     LOS: 1  day    Time spent: 25 minutes.    Barb Merino, MD [facility] Pager [phone]  If 7PM-7AM, please contact night-coverage [email or web address] Password [email or web address] 04/07/2019, 11:17 AM

## 2019-04-08 LAB — BASIC METABOLIC PANEL
Anion gap: 13 (ref 5–15)
BUN: 16 mg/dL (ref 8–23)
CO2: 23 mmol/L (ref 22–32)
Calcium: 8.6 mg/dL — ABNORMAL LOW (ref 8.9–10.3)
Chloride: 98 mmol/L (ref 98–111)
Creatinine, Ser: 0.77 mg/dL (ref 0.44–1.00)
GFR calc Af Amer: >60 mL/min (ref >60)
GFR calc non Af Amer: >60 mL/min (ref >60)
Glucose, Bld: 295 mg/dL — ABNORMAL HIGH (ref 70–99)
Potassium: 3.3 mmol/L — ABNORMAL LOW (ref 3.5–5.1)
Sodium: 134 mmol/L — ABNORMAL LOW (ref 135–145)

## 2019-04-08 LAB — CBC
HCT: 38.7 % (ref 36.0–46.0)
Hemoglobin: 12.5 g/dL (ref 12.0–15.0)
MCH: 30.6 pg (ref 26.0–34.0)
MCHC: 32.3 g/dL (ref 30.0–36.0)
MCV: 94.9 fL (ref 80.0–100.0)
Platelets: 301 10*3/uL (ref 150–400)
RBC: 4.08 MIL/uL (ref 3.87–5.11)
RDW: 12.7 % (ref 11.5–15.5)
WBC: 12.9 10*3/uL — ABNORMAL HIGH (ref 4.0–10.5)
nRBC: 0 % (ref 0.0–0.2)

## 2019-04-08 LAB — GLUCOSE, CAPILLARY
Glucose-Capillary: 213 mg/dL — ABNORMAL HIGH (ref 70–99)
Glucose-Capillary: 176 mg/dL — ABNORMAL HIGH (ref 70–99)
Glucose-Capillary: 246 mg/dL — ABNORMAL HIGH (ref 70–99)
Glucose-Capillary: 306 mg/dL — ABNORMAL HIGH (ref 70–99)
Glucose-Capillary: 264 mg/dL — ABNORMAL HIGH (ref 70–99)

## 2019-04-08 MED ORDER — POTASSIUM CHLORIDE CRYS ER 20 MEQ PO TBCR
20.0000 meq | EXTENDED_RELEASE_TABLET | Freq: Two times a day (BID) | ORAL | Status: DC
  Administered 2019-04-08 – 2019-04-10 (×5): 20 meq via ORAL
  Filled 2019-04-08 (×5): qty 1

## 2019-04-08 NOTE — TOC Initial Note (Signed)
Transition of Care [facility]) - Initial/Assessment Note    Patient Details  Name: LACE CHENEVERT MRN: [id number] Date of Birth: 1951/07/18  Transition of Care (TOC) CM/SW Contact:    Joaquin Courts, RN Phone Number: 04/08/2019, 12:44 PM  Clinical Narrative:   CM spoke with patient at bedside regarding recommendation for [facility] PT. CM provided patient with cms ratings list for [facility] agencies.  Patient states will speak with her daughter prior to making a decision. CM to follow.                  Expected Discharge Plan: Home/Self Care Barriers to Discharge: Continued Medical Work up   Patient Goals and CMS Choice   CMS Medicare.gov Compare Post Acute Care list provided to:: Patient Choice offered to / list presented to : Patient  Expected Discharge Plan and Services Expected Discharge Plan: Home/Self Care     Post Acute Care Choice: Home Health                                        Prior Living Arrangements/Services   Lives with:: Spouse, Adult Children Patient language and need for interpreter reviewed:: Yes        Need for Family Participation in Patient Care: Yes (Comment) Care giver support system in place?: Yes (comment)   Criminal Activity/Legal Involvement Pertinent to Current Situation/Hospitalization: No - Comment as needed  Activities of Daily Living Home Assistive Devices/Equipment: Nebulizer, CBG Meter ADL Screening (condition at time of admission) Patient's cognitive ability adequate to safely complete daily activities?: Yes Is the patient deaf or have difficulty hearing?: No Does the patient have difficulty seeing, even when wearing glasses/contacts?: No Does the patient have difficulty concentrating, remembering, or making decisions?: No Patient able to express need for assistance with ADLs?: Yes Does the patient have difficulty dressing or bathing?: No Independently performs ADLs?: Yes (appropriate for developmental age) Does the patient have  difficulty walking or climbing stairs?: Yes Weakness of Legs: None Weakness of Arms/Hands: None  Permission Sought/Granted                  Emotional Assessment Appearance:: Appears stated age Attitude/Demeanor/Rapport: Engaged Affect (typically observed): Accepting Orientation: : Oriented to Self, Oriented to Place, Oriented to Situation, Oriented to  Time Alcohol / Substance Use: Tobacco Use, Alcohol Use Psych Involvement: No (comment)  Admission diagnosis:  COPD exacerbation ([place]) [J44.1] Patient Active Problem List   Diagnosis Date Noted  . Acne vulgaris 03/30/2019  . Allergic rhinitis 03/20/2019  . COPD exacerbation ([place]) 03/19/2019  . Hypomagnesemia   . Influenza A 01/14/2019  . Acute frontal sinusitis 01/09/2019  . Anxiety and depression 01/09/2019  . Type 2 diabetes mellitus with complication, without long-term current use of insulin ([place]) 12/26/2018  . Axillary hidradenitis suppurativa 10/13/2018  . Dizziness 09/21/2018  . Lower extremity edema 09/21/2018  . Irregular cardiac rhythm 08/25/2018  . Chronic recurrent sinusitis   . Alopecia of scalp 07/25/2018  . Atopic dermatitis 03/20/2018  . Seasonal allergies 02/25/2017  . Chronic obstructive pulmonary disease with bronchospasm ([place]) 12/24/2014  . QT prolongation 08/08/2014  . Dyslipidemia associated with type 2 diabetes mellitus ([place]) 06/14/2014  . Hypokalemia 12/20/2012  . History of breast cancer 12/01/2012  . Primary osteoarthritis of right knee 09/29/2012  . GAD (generalized anxiety disorder) 12/30/2009  . Type 2 diabetes mellitus ([place]) 05/14/2009  . Tobacco abuse 04/06/2009  .  Hypertension, benign essential, goal below 140/90 04/06/2009   PCP:  Mosetta Anis, MD Pharmacy:   [facility] [place], [place] - [address] [place] [address] Phone: [phone] Fax: [phone]  [facility] [phone] -  [place], [place] - [address] [place] [address] Phone: [phone] Fax: [phone]  [facility] [address]), [place] [address] [place] [address] Phone: [phone] Fax: [phone]     Social Determinants of Health (SDOH) Interventions    Readmission Risk Interventions Readmission Risk Prevention Plan 04/08/2019 03/22/2019 02/19/2019  Transportation Screening Complete Complete Complete  PCP or Specialist Appt within 5-7 Days - - Complete  PCP or Specialist Appt within 3-5 Days Not Complete Complete -  Not Complete comments not yet ready for d/c - -  Home Care Screening - - Not Complete  Home Care Screening Not Completed Comments - - NA  Medication Review (RN CM) - - Complete  HRI or Home Care Consult Complete Complete -  Social Work Consult for Recovery Care Planning/Counseling Complete Complete -  Palliative Care Screening Not Applicable Not Applicable -  Medication Review Press photographer) Complete Complete -  Some recent data might be hidden

## 2019-04-08 NOTE — Progress Notes (Signed)
PROGRESS NOTE    [name]  WCB:[id number] DOB: May 14, 1951 DOA: 04/06/2019 PCP: Mosetta Anis, MD    Brief Narrative:  68 year old female with history of COPD, hypertension, hyperlipidemia, depression and anxiety, active smoker and recurrent hospitalization, recent hospitalization 2 weeks ago for COPD exacerbation presenting back to the hospital with cough wheezing and is stating that she cannot catch her breath.  In the emergency room patient is on room air.  Hypokalemia with potassium 2.9.  Patient was found with significant bronchospasm and admitted to the hospital for COPD exacerbation.   Assessment & Plan:   Principal Problem:   COPD exacerbation ([place]) Active Problems:   Type 2 diabetes mellitus (HCC)   GAD (generalized anxiety disorder)   Hypertension, benign essential, goal below 140/90   History of breast cancer   Dyslipidemia associated with type 2 diabetes mellitus (HCC)   Chronic recurrent sinusitis   Type 2 diabetes mellitus with complication, without long-term current use of insulin (HCC)   Anxiety and depression  COPD with acute exacerbation: Agree with continuous hospitalization because of severity of symptoms. Aggressive bronchodilator therapy, IV steroids, inhalational steroids, scheduled and as needed bronchodilators, deep breathing exercises, incentive spirometry, chest physiotherapy and respiratory therapy consult. Antibiotics due to severity of symptoms.  Patient on doxycycline. Supplemental oxygen to keep saturations more than 92%.  Type 2 diabetes: Patient on metformin at home.  Holding metformin.  On sliding scale insulin.  Hypertension: Blood pressures fairly stable.  Continue home medications including amlodipine and hydrochlorothiazide.  Hypokalemia: Replaced and improved.  GERD: On PPI.  Anxiety and depression: Significant component.  On Cymbalta she will continue.  Prolonged QT: Mostly recorded with sinus tachycardia.  Will need a repeat  EKG when her heart rate is controlled.   DVT prophylaxis: Subcu heparin Code Status: Full code Family Communication: None Disposition Plan: Home after hospitalization   Consultants:   None  Procedures:   None  Antimicrobials:   Doxycycline, 04/06/2019   Subjective: Patient was seen and examined at the bedside.  Complains of wheezing.  She says she is not able to bring up the sputum. Objective: Vitals:   04/07/19 2026 04/08/19 0220 04/08/19 0432 04/08/19 0550  BP: (!) 142/76  (!) 154/84   Pulse: 97  77   Resp: 20  20   Temp: 98.7 F (37.1 C)  98.7 F (37.1 C)   TempSrc:   Oral   SpO2: 100% 98% 99%   Weight:    94.1 kg  Height:        Intake/Output Summary (Last 24 hours) at 04/08/2019 1323 Last data filed at 04/07/2019 1800 Gross per 24 hour  Intake 240 ml  Output -  Net 240 ml   Filed Weights   04/06/19 0800 04/06/19 1312 04/08/19 0550  Weight: 91.7 kg 90.3 kg 94.1 kg    Examination:  General exam: Appears anxious.  Patient is on room air.   Respiratory system: Bilateral expiratory wheezes.  Poor bilateral air entry.  She also has mostly conducted airway sounds. Cardiovascular system: S1 & S2 heard, RRR. No JVD, murmurs, rubs, gallops or clicks. No pedal edema. Gastrointestinal system: Abdomen is nondistended, soft and nontender. No organomegaly or masses felt. Normal bowel sounds heard.  Obese. Central nervous system: Alert and oriented. No focal neurological deficits. Extremities: Symmetric 5 x 5 power. Skin: No rashes, lesions or ulcers Psychiatry: Judgement and insight appear normal. Mood & affect anxious.    Data Reviewed: I have personally reviewed following labs and  imaging studies  CBC: Recent Labs  Lab 04/06/19 0845 04/07/19 0604 04/08/19 0404  WBC 9.4 10.7* 12.9*  NEUTROABS 6.4  --   --   HGB 13.0 11.7* 12.5  HCT 38.5 35.4* 38.7  MCV 93.4 93.9 94.9  PLT 330 282 732   Basic Metabolic Panel: Recent Labs  Lab 04/06/19 0843 04/06/19  0845 04/06/19 1330 04/07/19 0604 04/08/19 0404  NA 136  --   --  133* 134*  K 2.9*  --   --  3.6 3.3*  CL 95*  --   --  96* 98  CO2 29  --   --  26 23  GLUCOSE 120*  --   --  246* 295*  BUN 9  --   --  18 16  CREATININE 0.72  --   --  0.76 0.77  CALCIUM 8.6*  --  8.4* 8.5* 8.6*  MG  --   --  2.0  --   --   PHOS  --  3.6  --   --   --    GFR: Estimated Creatinine Clearance: 77.8 mL/min (by C-G formula based on SCr of 0.77 mg/dL). Liver Function Tests: No results for input(s): AST, ALT, ALKPHOS, BILITOT, PROT, ALBUMIN in the last 168 hours. No results for input(s): LIPASE, AMYLASE in the last 168 hours. No results for input(s): AMMONIA in the last 168 hours. Coagulation Profile: Recent Labs  Lab 04/07/19 0604  INR 1.0   Cardiac Enzymes: No results for input(s): CKTOTAL, CKMB, CKMBINDEX, TROPONINI in the last 168 hours. BNP (last 3 results) No results for input(s): PROBNP in the last 8760 hours. HbA1C: Recent Labs    04/06/19 1330  HGBA1C 6.6*   CBG: Recent Labs  Lab 04/07/19 0740 04/07/19 1141 04/07/19 1630 04/08/19 0720 04/08/19 1134  GLUCAP 228* 306* 176* 246* 306*   Lipid Profile: No results for input(s): CHOL, HDL, LDLCALC, TRIG, CHOLHDL, LDLDIRECT in the last 72 hours. Thyroid Function Tests: No results for input(s): TSH, T4TOTAL, FREET4, T3FREE, THYROIDAB in the last 72 hours. Anemia Panel: No results for input(s): VITAMINB12, FOLATE, FERRITIN, TIBC, IRON, RETICCTPCT in the last 72 hours. Sepsis Labs: No results for input(s): PROCALCITON, LATICACIDVEN in the last 168 hours.  Recent Results (from the past 240 hour(s))  SARS Coronavirus 2 (CEPHEID - Performed in [place] hospital lab), Hosp Order     Status: None   Collection Time: 04/06/19  8:44 AM  Result Value Ref Range Status   SARS Coronavirus 2 NEGATIVE NEGATIVE Final    Comment: (NOTE) If result is NEGATIVE SARS-CoV-2 target nucleic acids are NOT DETECTED. The SARS-CoV-2 RNA is generally  detectable in upper and lower  respiratory specimens during the acute phase of infection. The lowest  concentration of SARS-CoV-2 viral copies this assay can detect is 250  copies / mL. A negative result does not preclude SARS-CoV-2 infection  and should not be used as the sole basis for treatment or other  patient management decisions.  A negative result may occur with  improper specimen collection / handling, submission of specimen other  than nasopharyngeal swab, presence of viral mutation(s) within the  areas targeted by this assay, and inadequate number of viral copies  (<250 copies / mL). A negative result must be combined with clinical  observations, patient history, and epidemiological information. If result is POSITIVE SARS-CoV-2 target nucleic acids are DETECTED. The SARS-CoV-2 RNA is generally detectable in upper and lower  respiratory specimens dur ing the acute phase  of infection.  Positive  results are indicative of active infection with SARS-CoV-2.  Clinical  correlation with patient history and other diagnostic information is  necessary to determine patient infection status.  Positive results do  not rule out bacterial infection or co-infection with other viruses. If result is PRESUMPTIVE POSTIVE SARS-CoV-2 nucleic acids MAY BE PRESENT.   A presumptive positive result was obtained on the submitted specimen  and confirmed on repeat testing.  While 2019 novel coronavirus  (SARS-CoV-2) nucleic acids may be present in the submitted sample  additional confirmatory testing may be necessary for epidemiological  and / or clinical management purposes  to differentiate between  SARS-CoV-2 and other Sarbecovirus currently known to infect humans.  If clinically indicated additional testing with an alternate test  methodology [id number]) is advised. The SARS-CoV-2 RNA is generally  detectable in upper and lower respiratory sp ecimens during the acute  phase of infection. The  expected result is Negative. Fact Sheet for Patients:  StrictlyIdeas.no Fact Sheet for Healthcare Providers: BankingDealers.co.za This test is not yet approved or cleared by the [place] FDA and has been authorized for detection and/or diagnosis of SARS-CoV-2 by FDA under an Emergency Use Authorization (EUA).  This EUA will remain in effect (meaning this test can be used) for the duration of the COVID-19 declaration under Section 564(b)(1) of the Act, 21 U.S.C. section 360bbb-3(b)(1), unless the authorization is terminated or revoked sooner. Performed at [facility], [place] [address] [place], [place] [address]   Culture, sputum-assessment     Status: None   Collection Time: 04/06/19  6:04 PM  Result Value Ref Range Status   Specimen Description SPU EXPECTORATED  Final   Special Requests NONE  Final   Sputum evaluation   Final    THIS SPECIMEN IS ACCEPTABLE FOR SPUTUM CULTURE Performed at [facility], [place] [address], [place], [place] [address]    Report Status 04/06/2019 FINAL  Final  Culture, respiratory     Status: None (Preliminary result)   Collection Time: 04/06/19  6:04 PM  Result Value Ref Range Status   Specimen Description   Final    SPU EXPECTORATED Performed at [place] [address], [place], [place] [address]    Special Requests   Final    NONE Reflexed from [id number] Performed at [facility], [place] [address], [place], [place] [address]    Gram Stain NO WBC SEEN NO ORGANISMS SEEN   Final   Culture   Final    CULTURE REINCUBATED FOR BETTER GROWTH Performed at [facility], [place] [address], [place], [place] [address]    Report Status PENDING  Incomplete         Radiology Studies: No results found.      Scheduled Meds: . amLODipine  10 mg Oral Daily  . atorvastatin  20 mg Oral q1800  . docusate sodium  100 mg Oral  BID  . doxycycline  100 mg Oral Q12H  . DULoxetine  60 mg Oral Daily  . guaiFENesin-dextromethorphan  10 mL Oral Q6H  . heparin  5,000 Units Subcutaneous Q8H  . insulin aspart  0-15 Units Subcutaneous TID WC  . levalbuterol  0.63 mg Nebulization Q6H  . loratadine  10 mg Oral Daily  . methylPREDNISolone (SOLU-MEDROL) injection  60 mg Intravenous Q6H  . metoprolol tartrate  25 mg Oral BID  . potassium chloride  20 mEq Oral BID  . sodium chloride flush  3 mL Intravenous Q12H  . umeclidinium bromide  1 puff Inhalation Daily   Continuous Infusions: . sodium chloride Stopped (04/06/19 1756)     LOS: 2 days    Time spent: 25 minutes.    Barb Merino, MD [facility] Pager [phone]  If 7PM-7AM, please contact night-coverage [email or web address] Password [email or web address] 04/08/2019, 1:23 PM

## 2019-04-09 LAB — CULTURE, RESPIRATORY W GRAM STAIN
Culture: NORMAL
Gram Stain: NONE SEEN

## 2019-04-09 LAB — GLUCOSE, CAPILLARY
Glucose-Capillary: 237 mg/dL — ABNORMAL HIGH (ref 70–99)
Glucose-Capillary: 413 mg/dL — ABNORMAL HIGH (ref 70–99)
Glucose-Capillary: 271 mg/dL — ABNORMAL HIGH (ref 70–99)
Glucose-Capillary: 231 mg/dL — ABNORMAL HIGH (ref 70–99)
Glucose-Capillary: 315 mg/dL — ABNORMAL HIGH (ref 70–99)

## 2019-04-09 LAB — CBC
HCT: 39.8 % (ref 36.0–46.0)
Hemoglobin: 13.3 g/dL (ref 12.0–15.0)
MCH: 31.5 pg (ref 26.0–34.0)
MCHC: 33.4 g/dL (ref 30.0–36.0)
MCV: 94.3 fL (ref 80.0–100.0)
Platelets: 287 10*3/uL (ref 150–400)
RBC: 4.22 MIL/uL (ref 3.87–5.11)
RDW: 12.8 % (ref 11.5–15.5)
WBC: 11 10*3/uL — ABNORMAL HIGH (ref 4.0–10.5)
nRBC: 0 % (ref 0.0–0.2)

## 2019-04-09 LAB — BASIC METABOLIC PANEL
Anion gap: 12 (ref 5–15)
BUN: 20 mg/dL (ref 8–23)
CO2: 24 mmol/L (ref 22–32)
Calcium: 8.6 mg/dL — ABNORMAL LOW (ref 8.9–10.3)
Chloride: 97 mmol/L — ABNORMAL LOW (ref 98–111)
Creatinine, Ser: 0.73 mg/dL (ref 0.44–1.00)
GFR calc Af Amer: >60 mL/min (ref >60)
GFR calc non Af Amer: >60 mL/min (ref >60)
Glucose, Bld: 290 mg/dL — ABNORMAL HIGH (ref 70–99)
Potassium: 4 mmol/L (ref 3.5–5.1)
Sodium: 133 mmol/L — ABNORMAL LOW (ref 135–145)

## 2019-04-09 MED ORDER — CYCLOBENZAPRINE HCL 5 MG PO TABS
5.0000 mg | ORAL_TABLET | Freq: Three times a day (TID) | ORAL | Status: DC | PRN
  Administered 2019-04-09 – 2019-04-10 (×3): 5 mg via ORAL
  Filled 2019-04-09 (×3): qty 1

## 2019-04-09 MED ORDER — INSULIN ASPART 100 UNIT/ML ~~LOC~~ SOLN
5.0000 [IU] | Freq: Once | SUBCUTANEOUS | Status: AC
  Administered 2019-04-09: 5 [IU] via SUBCUTANEOUS

## 2019-04-09 NOTE — Consult Note (Signed)
   [facility] CM Inpatient Consult   04/09/2019  ARMINE RIZZOLO [birth year] [id number]   Patient chart has been reviewed for readmissions less than 30 days and for high risk score, 24%, for unplanned readmissions.  Patient assessed for community [place] [facility] follow up needs.    Patient had been active with [facility] CM after last hospitalization. Spoke with Ms. Soloway by telephone to review [place] [facility] program and assess for community needs. Patient is appreciative of the call. States that she does not feel the need for community RN follow up. Ms. Waldorf states that she does not need any food resources at this time. She states that she does have contact information for [facility] CM should she want to engage in services in the future.  Patient declines [facility] CM services at this time.  Netta Cedars, MSN, [facility] Liaison Nurse Mobile Phone [phone]  Toll free office [phone]

## 2019-04-09 NOTE — Progress Notes (Signed)
Pt. CBG 315. No HS coverage at this time. On call NP Schorr paged and made aware. Will carry out any new orders and will continue to monitor.

## 2019-04-09 NOTE — Care Management Important Message (Signed)
Important Message  Patient Details IM Letter given to Dessa Phi RN to present to the Patient Name: [name] MRN: [id number] Date of Birth: May 21, 1951   Medicare Important Message Given:  Yes    Kerin Salen 04/09/2019, 12:01 PM

## 2019-04-09 NOTE — Progress Notes (Signed)
Occupational Therapy Treatment Patient Details Name: [name] MRN: [id number] DOB: [birth year] Today's Date: 04/09/2019    History of present illness 68 y.o. female with medical history significant of COPD/HTN/ HLD//depression/arthritis/history of right breast cancer , was hospitalized 2 weeks ago for COPD exacerbation and admitted 04/06/19 for acute respiratory failure and COPD exacerbation   OT comments  Energy conservation handout provided and gone over in detail.  Follow Up Recommendations  Home health OT;Supervision/Assistance - 24 hour    Equipment Recommendations  Tub/shower seat    Recommendations for Other Services      Precautions / Restrictions Precautions Precautions: Fall Restrictions Weight Bearing Restrictions: No       Mobility Bed Mobility Overal bed mobility: Modified Independent                Transfers Overall transfer level: Modified independent                    Balance Overall balance assessment: Mild deficits observed, not formally tested                                         ADL either performed or assessed with clinical judgement   ADL Overall ADL's : Needs assistance/impaired                                       General ADL Comments: OT session today focused on energy conservation focused and ADL activity,  Pt very fatigued this day but did verbalize understanding and verbalize ways she could apply energy conservation     Vision Patient Visual Report: No change from baseline     Perception     Praxis      Cognition Arousal/Alertness: Awake/alert Behavior During Therapy: WFL for tasks assessed/performed Overall Cognitive Status: Within Functional Limits for tasks assessed                                                     Pertinent Vitals/ Pain       Pain Assessment: No/denies pain         Frequency  Min 2X/week        Progress Toward  Goals  OT Goals(current goals can now be found in the care plan section)  Progress towards OT goals: Progressing toward goals  Acute Rehab OT Goals Patient Stated Goal: to be healthy and independent for the birth of her first great grandchild  OT Goal Formulation: With patient Time For Goal Achievement: 04/20/19 Potential to Achieve Goals: Good  Plan Discharge plan remains appropriate       AM-PAC OT "6 Clicks" Daily Activity     Outcome Measure   Help from another person eating meals?: None Help from another person taking care of personal grooming?: A Little Help from another person toileting, which includes using toliet, bedpan, or urinal?: A Little Help from another person bathing (including washing, rinsing, drying)?: A Little Help from another person to put on and taking off regular upper body clothing?: None Help from another person to put on and taking off regular lower body clothing?: A Little 6 Click Score: 20  End of Session    OT Visit Diagnosis: Muscle weakness (generalized) (M62.81);Unsteadiness on feet (R26.81)   Activity Tolerance Patient limited by fatigue   Patient Left in bed;with call bell/phone within reach   Nurse Communication Mobility status        Time: 6295-2841 OT Time Calculation (min): 20 min  Charges: OT General Charges $OT Visit: 1 Visit OT Treatments $Self Care/Home Management : 8-22 mins  Kari Baars, [place] [phone] Office- [phone], Edwena Felty D 04/09/2019, 3:49 PM

## 2019-04-09 NOTE — Progress Notes (Signed)
PROGRESS NOTE    RAFEEF [name]  UXL:[id number] DOB: 05/17/1951 DOA: 04/06/2019 PCP: Mosetta Anis, MD    Brief Narrative:  68 year old female with history of COPD, hypertension, hyperlipidemia, depression and anxiety, active smoker and recurrent hospitalization, recent hospitalization 2 weeks ago for COPD exacerbation presenting back to the hospital with cough wheezing and is stating that she cannot catch her breath.  In the emergency room patient is on room air.  Hypokalemia with potassium 2.9.  Patient was found with significant bronchospasm and admitted to the hospital for COPD exacerbation.  Assessment & Plan:   Principal Problem:   COPD exacerbation ([place]) Active Problems:   Type 2 diabetes mellitus (HCC)   GAD (generalized anxiety disorder)   Hypertension, benign essential, goal below 140/90   History of breast cancer   Dyslipidemia associated with type 2 diabetes mellitus (HCC)   Chronic recurrent sinusitis   Type 2 diabetes mellitus with complication, without long-term current use of insulin (HCC)   Anxiety and depression  COPD with acute exacerbation: Agree with continuous hospitalization because of severity of symptoms. Continue with bronchodilators, IV steroids, IV steroids, inhalational steroids, scheduled and as needed bronchodilators, deep breathing exercises, incentive spirometry, chest physiotherapy and respiratory therapy consult. Antibiotics due to severity of symptoms.  Patient on doxycycline. Supplemental oxygen to keep saturations more than 92%.  Patient is currently on room air. Continues to have wheezing, will treat 1 more day in the hospital.  Type 2 diabetes: Patient on metformin at home.  Holding metformin.  On sliding scale insulin.  Hypertension: Blood pressures fairly stable.  Continue home medications including amlodipine and hydrochlorothiazide.  Hypokalemia: Replaced and improved.  GERD: On PPI.  Anxiety and depression: Significant component.   On Cymbalta she will continue.  Prolonged QT: Mostly recorded with sinus tachycardia.  Will need a repeat EKG when her heart rate is controlled.   DVT prophylaxis: Subcu heparin Code Status: Full code Family Communication: None Disposition Plan: Home after hospitalization . anticipate discharge home tomorrow.   Consultants:   None  Procedures:   None  Antimicrobials:   Doxycycline, 04/06/2019---    Subjective: Patient was seen and examined at the bedside.  Continues to have wheezing.  No other overnight events.  First thing she is stated was "I am not ready to go home today"  Objective: Vitals:   04/08/19 2131 04/09/19 0240 04/09/19 0436 04/09/19 0843  BP: (!) 159/83  (!) 142/76   Pulse: 89  79   Resp: 16  20   Temp: 98 F (36.7 C)  97.9 F (36.6 C)   TempSrc:      SpO2: 99% 98% 100% 97%  Weight:      Height:        Intake/Output Summary (Last 24 hours) at 04/09/2019 1048 Last data filed at 04/09/2019 0034 Gross per 24 hour  Intake 118 ml  Output -  Net 118 ml   Filed Weights   04/06/19 0800 04/06/19 1312 04/08/19 0550  Weight: 91.7 kg 90.3 kg 94.1 kg    Examination:  General exam: Appears anxious.  Patient is on room air.  Audible wheezing. Respiratory system: Bilateral expiratory wheezes.  Poor bilateral air entry.  She also has mostly conducted airway sounds. Cardiovascular system: S1 & S2 heard, RRR. No JVD, murmurs, rubs, gallops or clicks. No pedal edema. Gastrointestinal system: Abdomen is nondistended, soft and nontender. No organomegaly or masses felt. Normal bowel sounds heard.  Obese. Central nervous system: Alert and oriented. No focal  neurological deficits. Extremities: Symmetric 5 x 5 power. Skin: No rashes, lesions or ulcers Psychiatry: Judgement and insight appear normal. Mood & affect anxious.    Data Reviewed: I have personally reviewed following labs and imaging studies  CBC: Recent Labs  Lab 04/06/19 0845 04/07/19 0604 04/08/19  0404 04/09/19 0445  WBC 9.4 10.7* 12.9* 11.0*  NEUTROABS 6.4  --   --   --   HGB 13.0 11.7* 12.5 13.3  HCT 38.5 35.4* 38.7 39.8  MCV 93.4 93.9 94.9 94.3  PLT 330 282 301 350   Basic Metabolic Panel: Recent Labs  Lab 04/06/19 0843 04/06/19 0845 04/06/19 1330 04/07/19 0604 04/08/19 0404 04/09/19 0445  NA 136  --   --  133* 134* 133*  K 2.9*  --   --  3.6 3.3* 4.0  CL 95*  --   --  96* 98 97*  CO2 29  --   --  26 23 24   GLUCOSE 120*  --   --  246* 295* 290*  BUN 9  --   --  18 16 20   CREATININE 0.72  --   --  0.76 0.77 0.73  CALCIUM 8.6*  --  8.4* 8.5* 8.6* 8.6*  MG  --   --  2.0  --   --   --   PHOS  --  3.6  --   --   --   --    GFR: Estimated Creatinine Clearance: 77.8 mL/min (by C-G formula based on SCr of 0.73 mg/dL). Liver Function Tests: No results for input(s): AST, ALT, ALKPHOS, BILITOT, PROT, ALBUMIN in the last 168 hours. No results for input(s): LIPASE, AMYLASE in the last 168 hours. No results for input(s): AMMONIA in the last 168 hours. Coagulation Profile: Recent Labs  Lab 04/07/19 0604  INR 1.0   Cardiac Enzymes: No results for input(s): CKTOTAL, CKMB, CKMBINDEX, TROPONINI in the last 168 hours. BNP (last 3 results) No results for input(s): PROBNP in the last 8760 hours. HbA1C: Recent Labs    04/06/19 1330  HGBA1C 6.6*   CBG: Recent Labs  Lab 04/08/19 0720 04/08/19 1134 04/08/19 1639 04/09/19 0732 04/09/19 0916  GLUCAP 246* 306* 264* 237* 413*   Lipid Profile: No results for input(s): CHOL, HDL, LDLCALC, TRIG, CHOLHDL, LDLDIRECT in the last 72 hours. Thyroid Function Tests: No results for input(s): TSH, T4TOTAL, FREET4, T3FREE, THYROIDAB in the last 72 hours. Anemia Panel: No results for input(s): VITAMINB12, FOLATE, FERRITIN, TIBC, IRON, RETICCTPCT in the last 72 hours. Sepsis Labs: No results for input(s): PROCALCITON, LATICACIDVEN in the last 168 hours.  Recent Results (from the past 240 hour(s))  SARS Coronavirus 2 (CEPHEID -  Performed in [place] hospital lab), Hosp Order     Status: None   Collection Time: 04/06/19  8:44 AM  Result Value Ref Range Status   SARS Coronavirus 2 NEGATIVE NEGATIVE Final    Comment: (NOTE) If result is NEGATIVE SARS-CoV-2 target nucleic acids are NOT DETECTED. The SARS-CoV-2 RNA is generally detectable in upper and lower  respiratory specimens during the acute phase of infection. The lowest  concentration of SARS-CoV-2 viral copies this assay can detect is 250  copies / mL. A negative result does not preclude SARS-CoV-2 infection  and should not be used as the sole basis for treatment or other  patient management decisions.  A negative result may occur with  improper specimen collection / handling, submission of specimen other  than nasopharyngeal swab, presence of viral mutation(s)  within the  areas targeted by this assay, and inadequate number of viral copies  (<250 copies / mL). A negative result must be combined with clinical  observations, patient history, and epidemiological information. If result is POSITIVE SARS-CoV-2 target nucleic acids are DETECTED. The SARS-CoV-2 RNA is generally detectable in upper and lower  respiratory specimens dur ing the acute phase of infection.  Positive  results are indicative of active infection with SARS-CoV-2.  Clinical  correlation with patient history and other diagnostic information is  necessary to determine patient infection status.  Positive results do  not rule out bacterial infection or co-infection with other viruses. If result is PRESUMPTIVE POSTIVE SARS-CoV-2 nucleic acids MAY BE PRESENT.   A presumptive positive result was obtained on the submitted specimen  and confirmed on repeat testing.  While 2019 novel coronavirus  (SARS-CoV-2) nucleic acids may be present in the submitted sample  additional confirmatory testing may be necessary for epidemiological  and / or clinical management purposes  to differentiate between   SARS-CoV-2 and other Sarbecovirus currently known to infect humans.  If clinically indicated additional testing with an alternate test  methodology [phone]) is advised. The SARS-CoV-2 RNA is generally  detectable in upper and lower respiratory sp ecimens during the acute  phase of infection. The expected result is Negative. Fact Sheet for Patients:  StrictlyIdeas.no Fact Sheet for Healthcare Providers: BankingDealers.co.za This test is not yet approved or cleared by the [place] FDA and has been authorized for detection and/or diagnosis of SARS-CoV-2 by FDA under an Emergency Use Authorization (EUA).  This EUA will remain in effect (meaning this test can be used) for the duration of the COVID-19 declaration under Section 564(b)(1) of the Act, 21 U.S.C. section 360bbb-3(b)(1), unless the authorization is terminated or revoked sooner. Performed at [facility], [place] [address], [place], [place] [address]   Culture, sputum-assessment     Status: None   Collection Time: 04/06/19  6:04 PM  Result Value Ref Range Status   Specimen Description SPU EXPECTORATED  Final   Special Requests NONE  Final   Sputum evaluation   Final    THIS SPECIMEN IS ACCEPTABLE FOR SPUTUM CULTURE Performed at [facility], [place] [address] [place], [place] [address]    Report Status 04/06/2019 FINAL  Final  Culture, respiratory     Status: None (Preliminary result)   Collection Time: 04/06/19  6:04 PM  Result Value Ref Range Status   Specimen Description   Final    SPU EXPECTORATED Performed at [place] [address] [place], [place] [address]    Special Requests   Final    NONE Reflexed from [id number] Performed at [facility], [place] [address], [place], [place] [address]    Gram Stain NO WBC SEEN NO ORGANISMS SEEN   Final   Culture   Final    RARE Consistent with normal  respiratory flora. Performed at [facility], [place] [address], [place], [place] [address]    Report Status PENDING  Incomplete         Radiology Studies: No results found.      Scheduled Meds: . amLODipine  10 mg Oral Daily  . atorvastatin  20 mg Oral q1800  . docusate sodium  100 mg Oral BID  . doxycycline  100 mg Oral Q12H  . DULoxetine  60 mg Oral Daily  . guaiFENesin-dextromethorphan  10 mL Oral Q6H  . heparin  5,000  Units Subcutaneous Q8H  . insulin aspart  0-15 Units Subcutaneous TID WC  . levalbuterol  0.63 mg Nebulization Q6H  . loratadine  10 mg Oral Daily  . methylPREDNISolone (SOLU-MEDROL) injection  60 mg Intravenous Q6H  . metoprolol tartrate  25 mg Oral BID  . potassium chloride  20 mEq Oral BID  . sodium chloride flush  3 mL Intravenous Q12H  . umeclidinium bromide  1 puff Inhalation Daily   Continuous Infusions: . sodium chloride Stopped (04/06/19 1756)     LOS: 3 days    Time spent: 25 minutes.    Barb Merino, MD [facility] Pager [phone]  If 7PM-7AM, please contact night-coverage [email or web address] Password [facility] 04/09/2019, 10:48 AM

## 2019-04-09 NOTE — Progress Notes (Addendum)
Physical Therapy Treatment Patient Details Name: [name] MRN: [id number] DOB: 1951/05/21 Today's Date: 04/09/2019    History of Present Illness 68 y.o. female with medical history significant of COPD/HTN/ HLD//depression/arthritis/history of right breast cancer , was hospitalized 2 weeks ago for COPD exacerbation and admitted 04/06/19 for acute respiratory failure and COPD exacerbation    PT Comments    Progressing with mobility. Pt continues to have dyspnea, wheezing. She also fatigues fairly easily but tolerance has improved. Discussed energy conservation. Recommend daily ambulation with nursing supervision as able.    Follow Up Recommendations  Home health PT;Supervision for mobility/OOB     Equipment Recommendations  None recommended by PT    Recommendations for Other Services       Precautions / Restrictions Precautions Precautions: Fall Restrictions Weight Bearing Restrictions: No    Mobility  Bed Mobility Overal bed mobility: Modified Independent                Transfers Overall transfer level: Modified independent                  Ambulation/Gait Ambulation/Gait assistance: Min guard Gait Distance (Feet): 200 Feet Assistive device: None(intermittent use of hallway handrail) Gait Pattern/deviations: Step-through pattern;Decreased stride length     General Gait Details: slightly unsteady gait. pt utilized hand rail occasionally for stability. 2/4 dyspnea with intermittent wheezing   Stairs             Wheelchair Mobility    Modified Rankin (Stroke Patients Only)       Balance Overall balance assessment: Mild deficits observed, not formally tested                                          Cognition Arousal/Alertness: Awake/alert Behavior During Therapy: WFL for tasks assessed/performed Overall Cognitive Status: Within Functional Limits for tasks assessed                                         Exercises      General Comments        Pertinent Vitals/Pain Pain Assessment: Faces Faces Pain Scale: Hurts little more Pain Location: ribs (reports hx of "broken ribs" from coughing/sneezing) Pain Intervention(s): Monitored during session    Home Living                      Prior Function            PT Goals (current goals can now be found in the care plan section) Progress towards PT goals: Progressing toward goals    Frequency    Min 3X/week      PT Plan Current plan remains appropriate    Co-evaluation              AM-PAC PT "6 Clicks" Mobility   Outcome Measure  Help needed turning from your back to your side while in a flat bed without using bedrails?: A Little Help needed moving from lying on your back to sitting on the side of a flat bed without using bedrails?: A Little Help needed moving to and from a bed to a chair (including a wheelchair)?: A Little Help needed standing up from a chair using your arms (e.g., wheelchair or bedside chair)?: A Little Help needed to  walk in hospital room?: A Little Help needed climbing 3-5 steps with a railing? : A Little 6 Click Score: 18    End of Session   Activity Tolerance: Patient tolerated treatment well Patient left: in bed;with call bell/phone within reach   PT Visit Diagnosis: Other abnormalities of gait and mobility (R26.89)     Time: 9672-8979 PT Time Calculation (min) (ACUTE ONLY): 14 min  Charges:  $Gait Training: 8-22 mins                        Weston Anna, [place] Pager: [phone] Office: [phone]

## 2019-04-10 ENCOUNTER — Encounter: Payer: Medicare Other | Admitting: Internal Medicine

## 2019-04-10 LAB — GLUCOSE, CAPILLARY
Glucose-Capillary: 261 mg/dL — ABNORMAL HIGH (ref 70–99)
Glucose-Capillary: 266 mg/dL — ABNORMAL HIGH (ref 70–99)
Glucose-Capillary: 247 mg/dL — ABNORMAL HIGH (ref 70–99)
Glucose-Capillary: 354 mg/dL — ABNORMAL HIGH (ref 70–99)

## 2019-04-10 MED ORDER — DOXYCYCLINE HYCLATE 100 MG PO TABS: 100.0000 mg | ORAL_TABLET | Freq: Two times a day (BID) | ORAL | 0 refills | Status: AC

## 2019-04-10 MED ORDER — CYCLOBENZAPRINE HCL 5 MG PO TABS: 5.0000 mg | ORAL_TABLET | Freq: Three times a day (TID) | ORAL | 0 refills | Status: AC | PRN

## 2019-04-10 MED ORDER — PREDNISONE 10 MG PO TABS: ORAL_TABLET | ORAL | 0 refills | Status: DC

## 2019-04-10 NOTE — TOC Progression Note (Addendum)
Transition of Care [facility]) - CM/SW Discharge Note   Patient Details  Name: [name] MRN: [id number] Date of Birth: 1951-01-26  [facility] CM/SW Contact:  Servando Snare, LCSW Phone Number: 04/10/2019, 11:36 AM   Clinical Narrative:   Consult for home health. Patient given list of options yesterday. LCSW attempted to get choice. Patient stated she wanted to discuss with her husband first. LCSW expressed the importance of getting choice prior to leaving. Patient expressed understanding.   Per chart patient dc'ing home to self care. No HH orders in chart. LCSW signing off.     Barriers to Discharge: Continued Medical Work up   Patient Goals and CMS Choice   CMS Medicare.gov Compare Post Acute Care list provided to:: Patient Choice offered to / list presented to : Patient  Discharge Placement                       Discharge Plan and Services     Post Acute Care Choice: Home Health                               Social Determinants of Health (SDOH) Interventions     Readmission Risk Interventions Readmission Risk Prevention Plan 04/08/2019 03/22/2019 02/19/2019  Transportation Screening Complete Complete Complete  PCP or Specialist Appt within 5-7 Days - - Complete  PCP or Specialist Appt within 3-5 Days Not Complete Complete -  Not Complete comments not yet ready for d/c - -  Home Care Screening - - Not Complete  Home Care Screening Not Completed Comments - - NA  Medication Review (RN CM) - - Complete  HRI or Home Care Consult Complete Complete -  Social Work Consult for Liberty Planning/Counseling Complete Complete -  Palliative Care Screening Not Applicable Not Applicable -  Medication Review Press photographer) Complete Complete -  Some recent data might be hidden

## 2019-04-10 NOTE — Progress Notes (Signed)
Occupational Therapy Treatment/DC Patient Details Name: [name] MRN: [id number] DOB: 1951-04-28 Today's Date: 04/10/2019    History of present illness 68 y.o. female with medical history significant of COPD/HTN/ HLD//depression/arthritis/history of right breast cancer , was hospitalized 2 weeks ago for COPD exacerbation and admitted 04/06/19 for acute respiratory failure and COPD exacerbation   OT comments  Goals met - will DC OT  Follow Up Recommendations  Home health OT;Supervision/Assistance - 24 hour    Equipment Recommendations  Tub/shower seat    Recommendations for Other Services      Precautions / Restrictions Restrictions Weight Bearing Restrictions: No       Mobility Bed Mobility Overal bed mobility: Modified Independent                Transfers Overall transfer level: Modified independent                    Balance Overall balance assessment: Mild deficits observed, not formally tested                                         ADL either performed or assessed with clinical judgement   ADL Overall ADL's : Modified independent                                       General ADL Comments: Pt mod I with simple ADL activity this day.  Pt states husband will A as needed     Vision Patient Visual Report: No change from baseline            Cognition Arousal/Alertness: Awake/alert Behavior During Therapy: WFL for tasks assessed/performed Overall Cognitive Status: Within Functional Limits for tasks assessed                                                     Pertinent Vitals/ Pain       Pain Assessment: No/denies pain     Prior Functioning/Environment              Frequency  Min 2X/week        Progress Toward Goals  OT Goals(current goals can now be found in the care plan section)  Progress towards OT goals: Goals met/education completed, patient discharged from  OT     Plan Discharge plan remains appropriate       AM-PAC OT "6 Clicks" Daily Activity     Outcome Measure   Help from another person eating meals?: None Help from another person taking care of personal grooming?: None Help from another person toileting, which includes using toliet, bedpan, or urinal?: None Help from another person bathing (including washing, rinsing, drying)?: None Help from another person to put on and taking off regular upper body clothing?: None Help from another person to put on and taking off regular lower body clothing?: None 6 Click Score: 24    End of Session    OT Visit Diagnosis: Muscle weakness (generalized) (M62.81);Unsteadiness on feet (R26.81)   Activity Tolerance Patient limited by fatigue   Patient Left in bed;with call bell/phone within reach   Nurse Communication Mobility status  Time: 1230-1240 OT Time Calculation (min): 10 min  Charges: OT Treatments $Self Care/Home Management : 8-22 mins  Lise Auer, OT Acute Rehabilitation Services [phone] Office- [phone]      Vernon Ariel, Karin Golden D 04/10/2019, 1:05 PM

## 2019-04-10 NOTE — Discharge Summary (Signed)
Physician Discharge Summary  NECHAMA ESCUTIA DVV:[id number] DOB: 1951/10/24 DOA: 04/06/2019  PCP: Mosetta Anis, MD  Admit date: 04/06/2019 Discharge date: 04/10/2019  Admitted From: Home. Disposition: Home.  Recommendations for Outpatient Follow-up:  1. Follow up with PCP in 1-2 weeks 2.   Home Health: Not applicable Equipment/Devices: Not applicable  Discharge Condition: Stable CODE STATUS: Full code Diet recommendation: Diabetic diet  Brief/Interim Summary: 68 year old female with history of COPD, hypertension, hyperlipidemia, depression and anxiety, active smoker and recurrent hospitalization, recent hospitalization 2 weeks ago for COPD exacerbation presented back to the hospital with cough, wheezing and is stating that she cannot catch her breath.  In the emergency room patient is on room air.  Hypokalemia with potassium 2.9. Patient was found with significant bronchospasm and admitted to the hospital for COPD exacerbation.   Patient is on maintenance Advair and as needed bronchodilators at home.  She was recently called for Spiriva that she has not started to use.  Discharge Diagnoses:  Principal Problem:   COPD exacerbation ([place]) Active Problems:   Type 2 diabetes mellitus (HCC)   GAD (generalized anxiety disorder)   Hypertension, benign essential, goal below 140/90   History of breast cancer   Dyslipidemia associated with type 2 diabetes mellitus (HCC)   Chronic recurrent sinusitis   Type 2 diabetes mellitus with complication, without long-term current use of insulin (HCC)   Anxiety and depression  COPD with acute exacerbation: Treated with aggressive bronchodilator therapy, IV steroids, inhalational steroids and chest physiotherapy with adequate clinical improvement.  Currently on room air and minimal upper airway symptoms. Patient was extensively educated regarding treatment of COPD. Stop smoking Regular use of Advair and Spiriva Regular use of albuterol nebulizer  until all her symptoms improve then use as needed. Mucinex and over-the-counter cough medications. Follow-up. Prolonged prednisone taper and doxycycline for 3 more days to finish 7 days of therapy.  Her blood sugars were elevated in the hospital when she was on high-dose IV steroids, will taper his steroids and ultimately stopped.  She is on metformin that she will continue.  Her electrolytes were replaced.  Patient also has a significant anxiety component.  She was recently started on Cymbalta that she will continue.  Discharge Instructions  Discharge Instructions    Call MD for:  difficulty breathing, headache or visual disturbances   Complete by:  As directed    Diet Carb Modified   Complete by:  As directed    Discharge instructions   Complete by:  As directed    Use adequate nebulizers at home until your symptoms improved then use as needed. Follow-up with your primary care physician and lung doctors. Use over-the-counter cough medications like Mucinex to help loosen your cough.   Increase activity slowly   Complete by:  As directed      Allergies as of 04/10/2019      Reactions   Ace Inhibitors Swelling   Throat swelling. Lisinopril-HCTZ Pt states she in not allergic    Flonase [fluticasone] Other (See Comments)   Sinuses stop up and condition worsens      Medication List    STOP taking these medications   cetirizine 10 MG tablet Commonly known as:  ZYRTEC     TAKE these medications   albuterol 108 (90 Base) MCG/ACT inhaler Commonly known as:  VENTOLIN HFA Inhale 2 puffs into the lungs every 6 (six) hours as needed for wheezing or shortness of breath.   amLODipine 10 MG tablet Commonly known as:  NORVASC Take 1 tablet (10 mg total) by mouth daily.   benzonatate 100 MG capsule Commonly known as:  TESSALON Take 1 capsule (100 mg total) by mouth 3 (three) times daily as needed for cough.   cyclobenzaprine 5 MG tablet Commonly known as:  FLEXERIL Take 1 tablet  (5 mg total) by mouth 3 (three) times daily as needed for up to 10 days for muscle spasms.   dextromethorphan-guaiFENesin 30-600 MG 12hr tablet Commonly known as:  MUCINEX DM Take 1 tablet by mouth 2 (two) times daily.   doxycycline 100 MG tablet Commonly known as:  VIBRA-TABS Take 1 tablet (100 mg total) by mouth every 12 (twelve) hours for 3 days.   DULoxetine 60 MG capsule Commonly known as:  CYMBALTA Take 1 capsule (60 mg total) by mouth daily.   fluticasone 50 MCG/ACT nasal spray Commonly known as:  Flonase Place 1 spray into both nostrils daily as needed for allergies or rhinitis.   Fluticasone-Salmeterol 250-50 MCG/DOSE Aepb Commonly known as:  ADVAIR Inhale 1 puff into the lungs 2 (two) times daily.   glucose blood test strip Commonly known as:  OneTouch Verio Use to check blood sugar once daily. (E11.8)   hydrochlorothiazide 25 MG tablet Commonly known as:  HYDRODIURIL Take 1 tablet (25 mg total) by mouth daily.   ibuprofen 800 MG tablet Commonly known as:  ADVIL Take 1 tablet (800 mg total) by mouth every 8 (eight) hours as needed for moderate pain (Take with food).   ipratropium-albuterol 0.5-2.5 (3) MG/3ML Soln Commonly known as:  DUONEB Take 3 mLs by nebulization every 6 (six) hours as needed.   losartan 100 MG tablet Commonly known as:  COZAAR Take 1 tablet (100 mg total) by mouth daily.   metFORMIN 1000 MG tablet Commonly known as:  GLUCOPHAGE Take 1 tablet (1,000 mg total) by mouth 2 (two) times daily.   metoprolol tartrate 25 MG tablet Commonly known as:  LOPRESSOR Take 1 tablet (25 mg total) by mouth 2 (two) times daily.   OneTouch Delica Lancets 03E Misc Use to check blood sugar once daily (E11.8)   OneTouch Verio w/Device Kit Use to check blood sugar once daily. (E11.8)   predniSONE 10 MG tablet Commonly known as:  DELTASONE 4 tabs for 3 days  3 tabs for 3 days  2 tabs for 3 days  1 tabs for 3 days   simvastatin 40 MG tablet Commonly  known as:  ZOCOR Take 1 tablet (40 mg total) by mouth daily at 6 PM.   Tiotropium Bromide Monohydrate 2.5 MCG/ACT Aers Commonly known as:  Spiriva Respimat Inhale 2 puffs into the lungs daily.       Allergies  Allergen Reactions  . Ace Inhibitors Swelling    Throat swelling. Lisinopril-HCTZ Pt states she in not allergic   . Flonase [Fluticasone] Other (See Comments)    Sinuses stop up and condition worsens    Consultations:  None.   Procedures/Studies: Dg Chest Port 1 View  Result Date: 04/06/2019 CLINICAL DATA:  Worsening shortness of breath today. Recent hospitalization for COPD exacerbation. History of diabetes and breast cancer. EXAM: PORTABLE CHEST 1 VIEW COMPARISON:  Radiographs 03/19/2019 and 02/18/2019. FINDINGS: 0754 hours. The heart size and mediastinal contours are normal. The lungs are clear. There is no pleural effusion or pneumothorax. No acute osseous findings are identified. There are surgical clips in the right axilla, multiple right-sided rib fractures and postsurgical changes in the lower cervical spine. Telemetry leads overlie the chest. IMPRESSION:  Stable chest.  No active cardiopulmonary process. Electronically Signed   By: Richardean Sale M.D.   On: 04/06/2019 08:35   Dg Chest Port 1 View  Result Date: 03/19/2019 CLINICAL DATA:  Cough, shortness of breath. EXAM: PORTABLE CHEST 1 VIEW COMPARISON:  Radiographs of February 18, 2019. FINDINGS: The heart size and mediastinal contours are within normal limits. Both lungs are clear. No pneumothorax or pleural effusion is noted. Right axillary surgical clips are noted. The visualized skeletal structures are unremarkable. IMPRESSION: No active disease. Electronically Signed   By: Marijo Conception M.D.   On: 03/19/2019 10:15      Subjective: Patient was seen and examined on the day of discharge.  She is eager to go home today.  He still has some wheezing but it is mostly bronchial breath sounds.   Discharge  Exam: Vitals:   04/10/19 0633 04/10/19 0801  BP: (!) 145/99   Pulse: 83   Resp: 20   Temp: 98.5 F (36.9 C)   SpO2: 100% 94%   Vitals:   04/09/19 2122 04/10/19 0108 04/10/19 0633 04/10/19 0801  BP: (!) 171/88  (!) 145/99   Pulse: 73  83   Resp: 20  20   Temp: 98.6 F (37 C)  98.5 F (36.9 C)   TempSrc: Oral  Oral   SpO2: 100% 99% 100% 94%  Weight:   95.4 kg   Height:        General: Pt is alert, awake, not in acute distress, on room air.  Walking in the hallway. Cardiovascular: RRR, S1/S2 +, no rubs, no gallops Respiratory: CTA bilaterally, no wheezing, no rhonchi, conducted bronchial breath sounds. Abdominal: Soft, NT, ND, bowel sounds + Extremities: no edema, no cyanosis    The results of significant diagnostics from this hospitalization (including imaging, microbiology, ancillary and laboratory) are listed below for reference.     Microbiology: Recent Results (from the past 240 hour(s))  SARS Coronavirus 2 (CEPHEID - Performed in [facility] hospital lab), Hosp Order     Status: None   Collection Time: 04/06/19  8:44 AM  Result Value Ref Range Status   SARS Coronavirus 2 NEGATIVE NEGATIVE Final    Comment: (NOTE) If result is NEGATIVE SARS-CoV-2 target nucleic acids are NOT DETECTED. The SARS-CoV-2 RNA is generally detectable in upper and lower  respiratory specimens during the acute phase of infection. The lowest  concentration of SARS-CoV-2 viral copies this assay can detect is 250  copies / mL. A negative result does not preclude SARS-CoV-2 infection  and should not be used as the sole basis for treatment or other  patient management decisions.  A negative result may occur with  improper specimen collection / handling, submission of specimen other  than nasopharyngeal swab, presence of viral mutation(s) within the  areas targeted by this assay, and inadequate number of viral copies  (<250 copies / mL). A negative result must be combined with clinical   observations, patient history, and epidemiological information. If result is POSITIVE SARS-CoV-2 target nucleic acids are DETECTED. The SARS-CoV-2 RNA is generally detectable in upper and lower  respiratory specimens dur ing the acute phase of infection.  Positive  results are indicative of active infection with SARS-CoV-2.  Clinical  correlation with patient history and other diagnostic information is  necessary to determine patient infection status.  Positive results do  not rule out bacterial infection or co-infection with other viruses. If result is PRESUMPTIVE POSTIVE SARS-CoV-2 nucleic acids MAY BE PRESENT.  A presumptive positive result was obtained on the submitted specimen  and confirmed on repeat testing.  While 2019 novel coronavirus  (SARS-CoV-2) nucleic acids may be present in the submitted sample  additional confirmatory testing may be necessary for epidemiological  and / or clinical management purposes  to differentiate between  SARS-CoV-2 and other Sarbecovirus currently known to infect humans.  If clinically indicated additional testing with an alternate test  methodology [phone]) is advised. The SARS-CoV-2 RNA is generally  detectable in upper and lower respiratory sp ecimens during the acute  phase of infection. The expected result is Negative. Fact Sheet for Patients:  StrictlyIdeas.no Fact Sheet for Healthcare Providers: BankingDealers.co.za This test is not yet approved or cleared by the [place] FDA and has been authorized for detection and/or diagnosis of SARS-CoV-2 by FDA under an Emergency Use Authorization (EUA).  This EUA will remain in effect (meaning this test can be used) for the duration of the COVID-19 declaration under Section 564(b)(1) of the Act, 21 U.S.C. section 360bbb-3(b)(1), unless the authorization is terminated or revoked sooner. Performed at [facility], [place]  [address], [place], [place] [address]   Culture, sputum-assessment     Status: None   Collection Time: 04/06/19  6:04 PM  Result Value Ref Range Status   Specimen Description SPU EXPECTORATED  Final   Special Requests NONE  Final   Sputum evaluation   Final    THIS SPECIMEN IS ACCEPTABLE FOR SPUTUM CULTURE Performed at [facility], [place] [address], [place], [place] [address]    Report Status 04/06/2019 FINAL  Final  Culture, respiratory     Status: None   Collection Time: 04/06/19  6:04 PM  Result Value Ref Range Status   Specimen Description   Final    SPU EXPECTORATED Performed at [place] [address] [place], [place] [address]    Special Requests   Final    NONE Reflexed from [id number] Performed at [facility], [place] [address], [place], [place] [address]    Gram Stain NO WBC SEEN NO ORGANISMS SEEN   Final   Culture   Final    RARE Consistent with normal respiratory flora. Performed at [facility], [place] [address] [place], [place] [address]    Report Status 04/09/2019 FINAL  Final     Labs: BNP (last 3 results) Recent Labs    08/08/18 1121 04/06/19 0843  BNP 18.2 69.6   Basic Metabolic Panel: Recent Labs  Lab 04/06/19 0843 04/06/19 0845 04/06/19 1330 04/07/19 0604 04/08/19 0404 04/09/19 0445  NA 136  --   --  133* 134* 133*  K 2.9*  --   --  3.6 3.3* 4.0  CL 95*  --   --  96* 98 97*  CO2 29  --   --  '26 23 24  ' GLUCOSE 120*  --   --  246* 295* 290*  BUN 9  --   --  '18 16 20  ' CREATININE 0.72  --   --  0.76 0.77 0.73  CALCIUM 8.6*  --  8.4* 8.5* 8.6* 8.6*  MG  --   --  2.0  --   --   --   PHOS  --  3.6  --   --   --   --    Liver Function Tests: No results for input(s): AST, ALT, ALKPHOS, BILITOT, PROT, ALBUMIN in the last 168 hours. No results for input(s):  LIPASE, AMYLASE in the last 168 hours. No results for input(s): AMMONIA in the last 168 hours. CBC: Recent Labs  Lab  04/06/19 0845 04/07/19 0604 04/08/19 0404 04/09/19 0445  WBC 9.4 10.7* 12.9* 11.0*  NEUTROABS 6.4  --   --   --   HGB 13.0 11.7* 12.5 13.3  HCT 38.5 35.4* 38.7 39.8  MCV 93.4 93.9 94.9 94.3  PLT 330 282 301 287   Cardiac Enzymes: No results for input(s): CKTOTAL, CKMB, CKMBINDEX, TROPONINI in the last 168 hours. BNP: Invalid input(s): POCBNP CBG: Recent Labs  Lab 04/09/19 0916 04/09/19 1138 04/09/19 1649 04/09/19 2122 04/10/19 0751  GLUCAP 413* 271* 231* 315* 247*   D-Dimer No results for input(s): DDIMER in the last 72 hours. Hgb A1c No results for input(s): HGBA1C in the last 72 hours. Lipid Profile No results for input(s): CHOL, HDL, LDLCALC, TRIG, CHOLHDL, LDLDIRECT in the last 72 hours. Thyroid function studies No results for input(s): TSH, T4TOTAL, T3FREE, THYROIDAB in the last 72 hours.  Invalid input(s): FREET3 Anemia work up No results for input(s): VITAMINB12, FOLATE, FERRITIN, TIBC, IRON, RETICCTPCT in the last 72 hours. Urinalysis    Component Value Date/Time   COLORURINE STRAW (A) 04/06/2019 [place] 04/06/2019 1804   LABSPEC 1.023 04/06/2019 1804   PHURINE 5.0 04/06/2019 1804   GLUCOSEU >=500 (A) 04/06/2019 1804   HGBUR NEGATIVE 04/06/2019 1804   BILIRUBINUR NEGATIVE 04/06/2019 1804   KETONESUR 5 (A) 04/06/2019 1804   PROTEINUR NEGATIVE 04/06/2019 1804   UROBILINOGEN 1.0 02/15/2012 1208   NITRITE NEGATIVE 04/06/2019 1804   LEUKOCYTESUR NEGATIVE 04/06/2019 1804   Sepsis Labs Invalid input(s): PROCALCITONIN,  WBC,  LACTICIDVEN Microbiology Recent Results (from the past 240 hour(s))  SARS Coronavirus 2 (CEPHEID - Performed in [place] hospital lab), Hosp Order     Status: None   Collection Time: 04/06/19  8:44 AM  Result Value Ref Range Status   SARS Coronavirus 2 NEGATIVE NEGATIVE Final    Comment: (NOTE) If result is NEGATIVE SARS-CoV-2 target nucleic acids are NOT DETECTED. The SARS-CoV-2 RNA is generally detectable in  upper and lower  respiratory specimens during the acute phase of infection. The lowest  concentration of SARS-CoV-2 viral copies this assay can detect is 250  copies / mL. A negative result does not preclude SARS-CoV-2 infection  and should not be used as the sole basis for treatment or other  patient management decisions.  A negative result may occur with  improper specimen collection / handling, submission of specimen other  than nasopharyngeal swab, presence of viral mutation(s) within the  areas targeted by this assay, and inadequate number of viral copies  (<250 copies / mL). A negative result must be combined with clinical  observations, patient history, and epidemiological information. If result is POSITIVE SARS-CoV-2 target nucleic acids are DETECTED. The SARS-CoV-2 RNA is generally detectable in upper and lower  respiratory specimens dur ing the acute phase of infection.  Positive  results are indicative of active infection with SARS-CoV-2.  Clinical  correlation with patient history and other diagnostic information is  necessary to determine patient infection status.  Positive results do  not rule out bacterial infection or co-infection with other viruses. If result is PRESUMPTIVE POSTIVE SARS-CoV-2 nucleic acids MAY BE PRESENT.   A presumptive positive result was obtained on the submitted specimen  and confirmed on repeat testing.  While 2019 novel coronavirus  (SARS-CoV-2) nucleic acids may be present in the submitted sample  additional  confirmatory testing may be necessary for epidemiological  and / or clinical management purposes  to differentiate between  SARS-CoV-2 and other Sarbecovirus currently known to infect humans.  If clinically indicated additional testing with an alternate test  methodology [phone]) is advised. The SARS-CoV-2 RNA is generally  detectable in upper and lower respiratory sp ecimens during the acute  phase of infection. The expected result is  Negative. Fact Sheet for Patients:  StrictlyIdeas.no Fact Sheet for Healthcare Providers: BankingDealers.co.za This test is not yet approved or cleared by the [place] FDA and has been authorized for detection and/or diagnosis of SARS-CoV-2 by FDA under an Emergency Use Authorization (EUA).  This EUA will remain in effect (meaning this test can be used) for the duration of the COVID-19 declaration under Section 564(b)(1) of the Act, 21 U.S.C. section 360bbb-3(b)(1), unless the authorization is terminated or revoked sooner. Performed at [facility], [place] [address], [place], [place] [address]   Culture, sputum-assessment     Status: None   Collection Time: 04/06/19  6:04 PM  Result Value Ref Range Status   Specimen Description SPU EXPECTORATED  Final   Special Requests NONE  Final   Sputum evaluation   Final    THIS SPECIMEN IS ACCEPTABLE FOR SPUTUM CULTURE Performed at [facility], [place] [address] [place], [place] [address]    Report Status 04/06/2019 FINAL  Final  Culture, respiratory     Status: None   Collection Time: 04/06/19  6:04 PM  Result Value Ref Range Status   Specimen Description   Final    SPU EXPECTORATED Performed at [place] [address] [place], [place] [address]    Special Requests   Final    NONE Reflexed from [id number] Performed at [facility], [place] [address], [place], [place] [address]    Gram Stain NO WBC SEEN NO ORGANISMS SEEN   Final   Culture   Final    RARE Consistent with normal respiratory flora. Performed at [facility], [place] [address] [place], [place] [address]    Report Status 04/09/2019 FINAL  Final     Time coordinating discharge: 35  minutes  SIGNED:   Barb Merino, MD  [facility] 04/10/2019, 11:06 AM Pager [phone]  If 7PM-7AM, please contact  night-coverage [email or web address] Password [email or web address]

## 2019-04-11 ENCOUNTER — Ambulatory Visit: Payer: Medicare Other

## 2019-04-13 ENCOUNTER — Other Ambulatory Visit: Payer: Self-pay

## 2019-04-13 NOTE — Patient Outreach (Signed)
[facility]) Care Management  04/13/2019  [name] 1950-11-17 [id number]  EMMI: general discharge red alert Referral date: 04/13/19 Referral reason: scheduled follow up appointment Insurance:  Medicare Day # 1  Telephone call to patient regarding EMMI general discharge red alert. HIPAA verified with patient. RNCM introduced herself and explained reason for call.  Patient states she has spoken with the office since discharge from the hospital. She states they will call her back to reschedule. RNCM advised patient if she does not hear back from them within the next 1-2 days to call primary MD office back and request post hospital discharge follow up appointment.  Patient verbalized understanding.  Patient states she sees a pulmonologist, Dr. Asencion Noble. She states it is difficult to get through to their office and scheduled an appointment.  RNCM offered to call pulmonologist office and assist with getting appointment scheduled. Patient voiced appreciation.  Patient states she is taking her medications as prescribed. She states the doctor wants her to take spiriva and Advair. Patient states she is unable to afford the spiriva. RNCM discussed and offered [facility] care management services. Patient declined.  Patient states she is working with Dr. Maudie Mercury regarding medication assistance.  RNCM discussed COPD symptoms with patient. Advised patient to contact her doctor for mild symptoms. Call 911 for severe symptoms.  RNCM discussed COVID 19 precautions and symptoms.  Advised patient to contact her doctor for minor symptoms. If symptoms more severe call 911.  Patient verbalized understanding.  Patient verbalized understanding.  RNCM advised patient to notify MD of any changes in condition prior to scheduled appointment. RNCM provided contact name and number: [phone] or main office number [phone] and 24 hour nurse advise line [phone] by mail as requested by patient.   RNCM verified patient aware of 911 services for urgent/ emergent needs.  RNCM contacted Dr. Bettina Gavia office and spoke with [facility].  Appointment scheduled for patient for April 24, 2019 at [facility] contacted patient and gave her appointment date and time.   PLAN; RNCM will contact patients pulmonology office and assist with scheduling follow up appointment.  RNCM will close due to patient being assessed and having no further needs.  RNCM will mail patient brochure/ magnet.    Quinn Plowman RN,BSN,CCM [facility] Telephonic  [phone]

## 2019-04-13 NOTE — Addendum Note (Signed)
Addended by: Forde Dandy on: 04/13/2019 03:33 PM   Modules accepted: Orders

## 2019-04-16 ENCOUNTER — Other Ambulatory Visit: Payer: Self-pay

## 2019-04-16 IMAGING — CT CT MAXILLOFACIAL W/O CM
3 of 4 series · 12 of 47 positions shown, 14 images · non-contrast
Comparison: None.

CLINICAL DATA: Acute sinusitis with complications suspected

EXAM:
CT MAXILLOFACIAL WITHOUT CONTRAST
TECHNIQUE: Multidetector CT images of the paranasal sinuses were obtained using
the standard protocol without intravenous contrast.

[Series 3: sinus 2.0 h30s · axial · 0.30mm/px · z∈[-226,-140]mm · 6 of 55 slices shown, 8 images]
[im 6/55  brain]
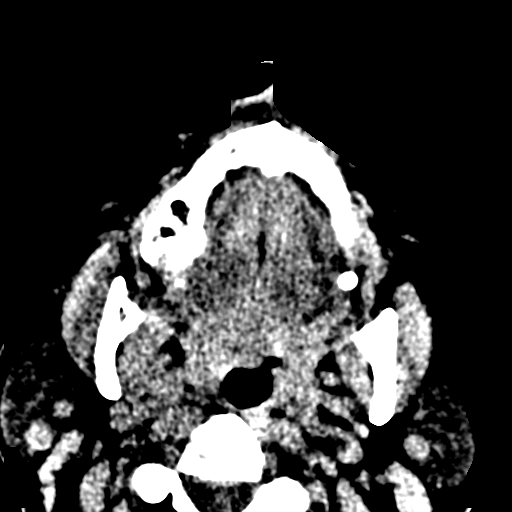
[im 6/55  bone]
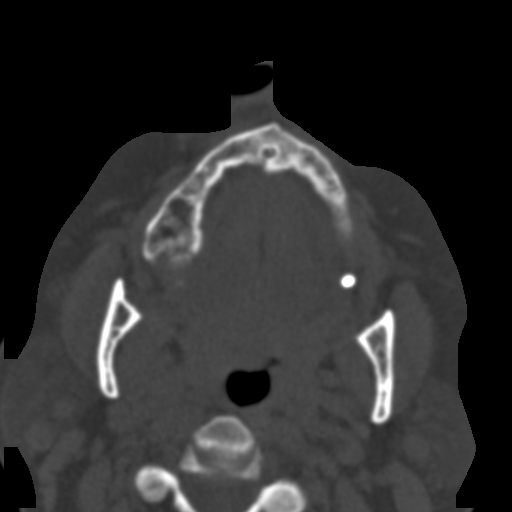
[im 15/55  bone]
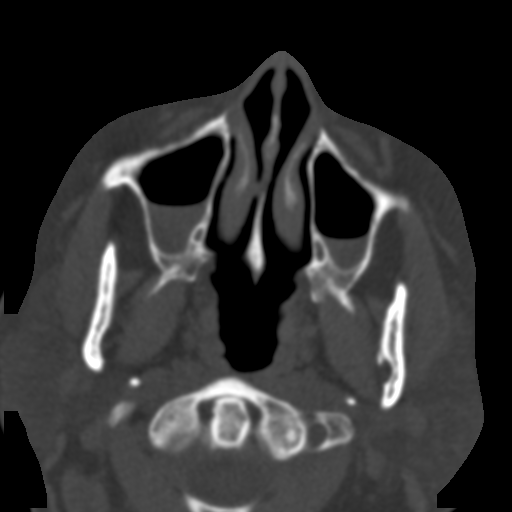
[im 23/55  bone]
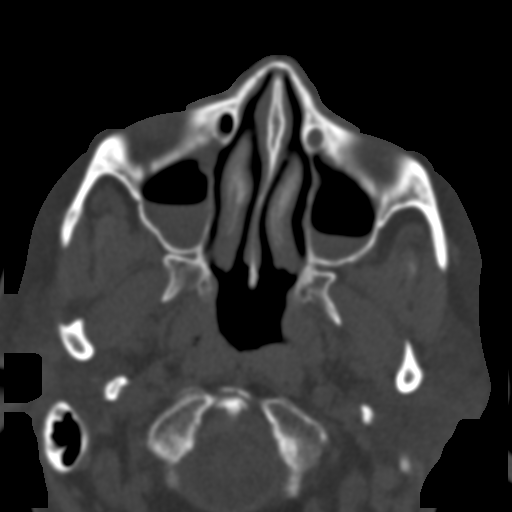
[im 32/55  bone]
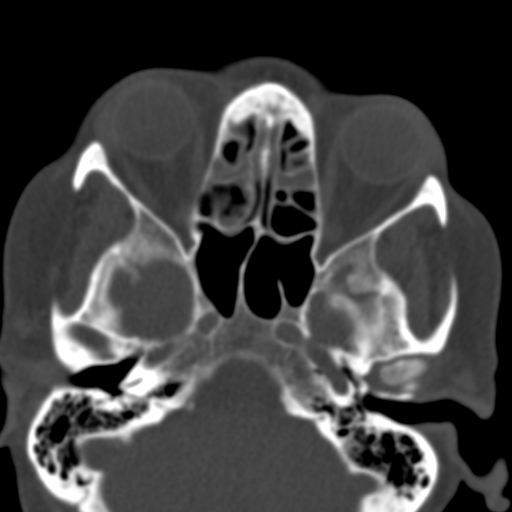
[im 41/55  brain]
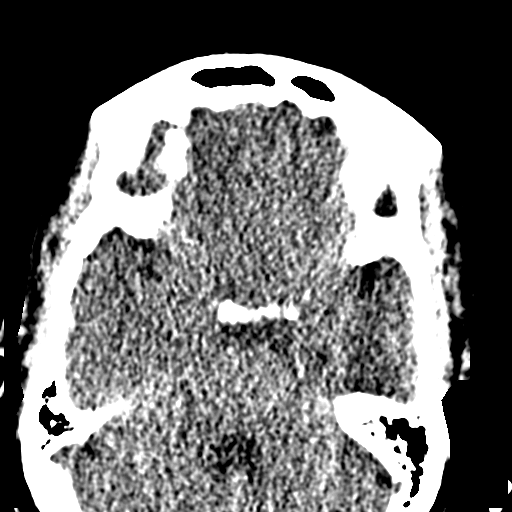
[im 41/55  bone]
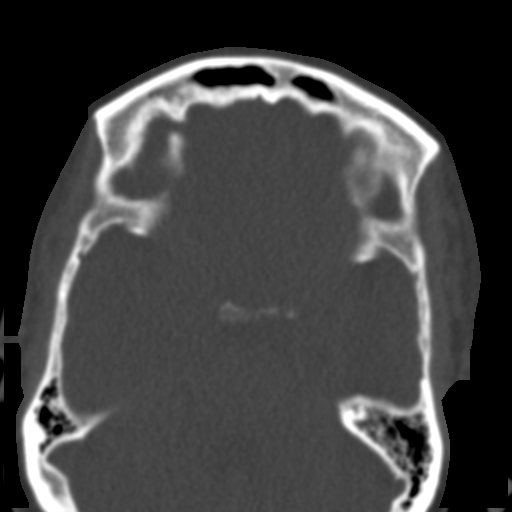
[im 49/55  bone]
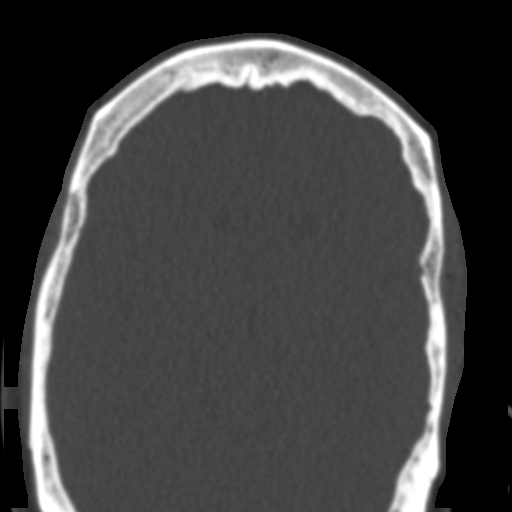

[Series 4: sinus 2.0 mpr cor · coronal · 0.21mm/px · 3 of 106 slices shown]
[im 27/106  bone]
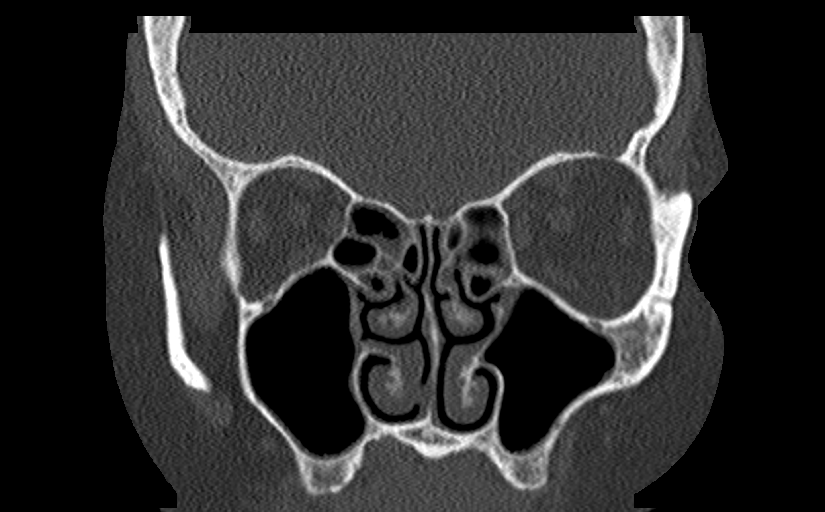
[im 53/106  bone]
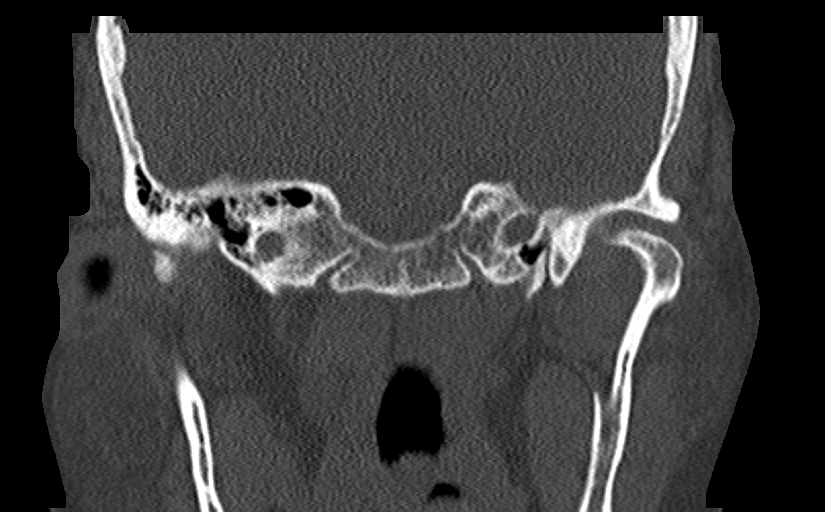
[im 79/106  bone]
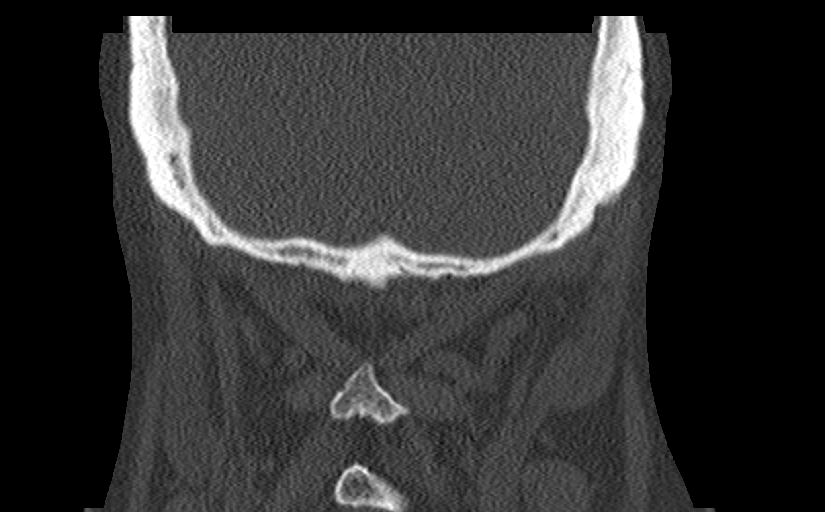

[Series 7: sinus 2.0 mpr sag · sagittal · 0.21mm/px · 3 of 95 slices shown]
[im 32/95  bone]
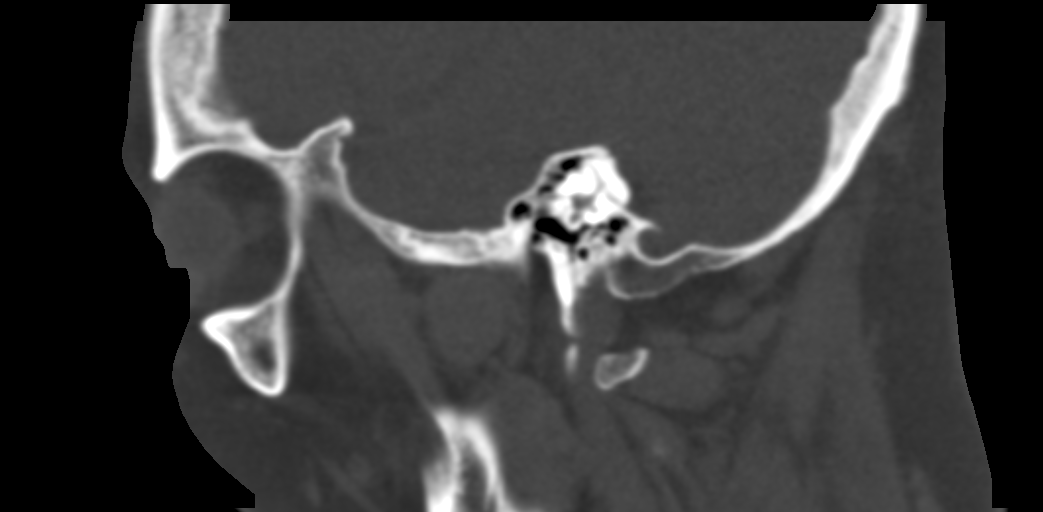
[im 48/95  bone]
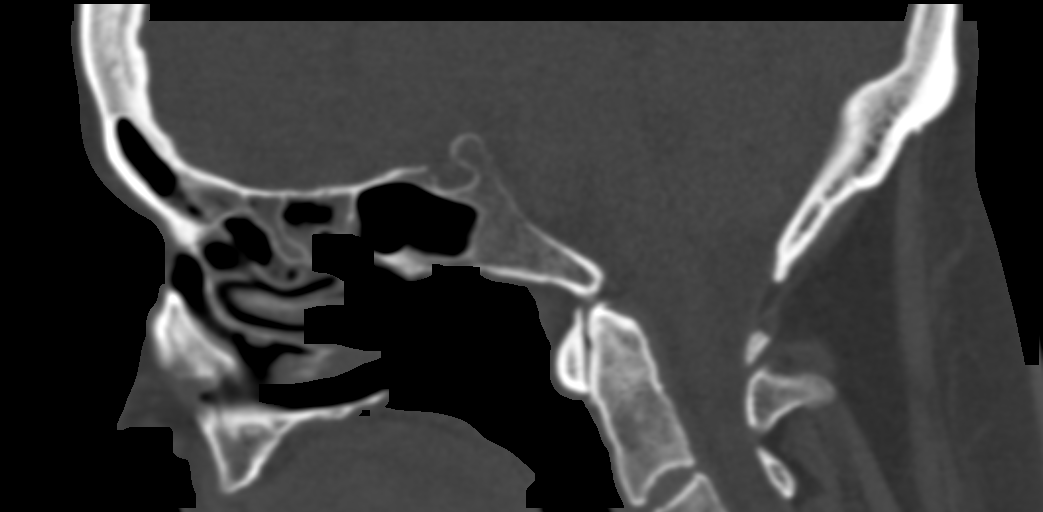
[im 63/95  bone]
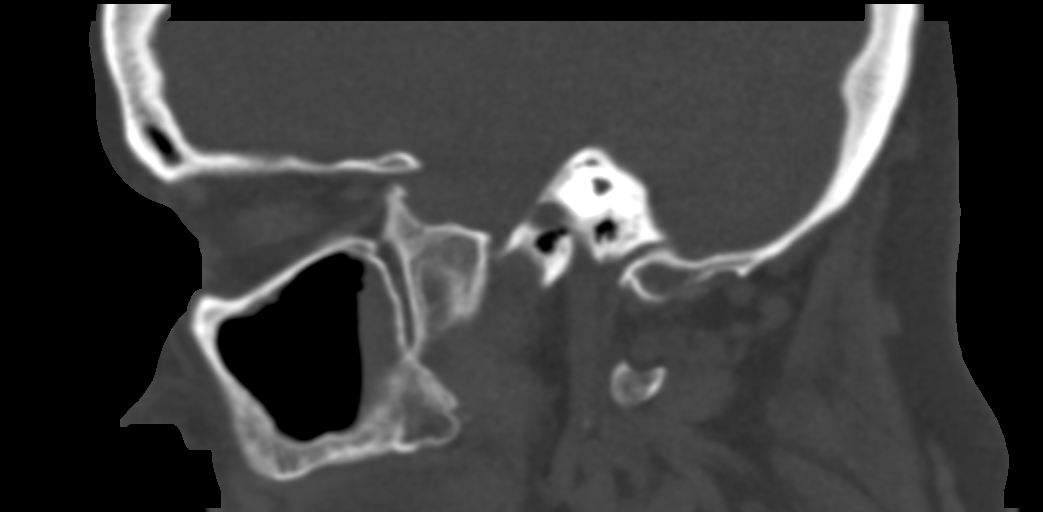

[12 of 47 positions shown; findings below may reference images not displayed]

FINDINGS: Paranasal sinuses:

Frontal: Mucosal thickening narrows both frontal ethmoidal recesses.
No fluid levels.

Ethmoid: Moderate patchy opacification by mucosal thickening and
frothy secretions.

Maxillary: Bilateral fluid level.

Sphenoid: Mild mucosal thickening in the anterior sinuses.

Right ostiomeatal unit: The infundibulum is completely opacified by
mucosal thickening. Patent middle meatus.

Left ostiomeatal unit: Stenotic infundibulum due to lateral
positioning and curvature of the uncinate. There is superimposed
mucosal thickening. Patent middle meatus.

Nasal passages: Patent. Nasal septum is mildly deviated towards the
left. There is symmetric polypoid soft tissue densities in the
bilateral anterior nasal cavity, parents consistent with respiratory
epithelial adenomatoid hamartoma

Anatomy:    No dehiscence of carotid or optic canals. No onodi cell.

Other: Orbits and intracranial compartment are unremarkable. Visible
mastoid air cells are normally aerated.
IMPRESSION: 1.  Active sinusitis including bilateral maxillary fluid levels.
2. Mucosal thickening impedes maxillary and frontal sinus outflow.
3. Symmetric polypoid densities in the anterior nasal cavity
consistent with respiratory epithelial adenomatoid hamartomas.

## 2019-04-16 NOTE — Patient Outreach (Signed)
[facility] [facility]) Care Management  04/16/2019  [name] 1951/09/22 [id number]   Referral Date: 04/16/2019 Referral Source: Md referral Referral Reason: Medication Assistance   Outreach Attempt: spoke with patient. She is able to verify HIPAA.  Discussed with patient reason for referral. She states she does need some assistance with medication.  She reports that she is out of her Albuterol and her Spiriva and Advair.  Patient states that she is slowly cutting her cigarettes.  Discussed with patient [facility] services.  Patient agreeable to pharmacy at this time to assist with medications.     Social: Patient lives in the home with spouse only. Patient states that she is independent with her care and that her husband takes her to her appointments.     Conditions: Patient history per chart review: 68 year old female with history of COPD, hypertension, hyperlipidemia, depression and anxiety, active smoker and recurrent hospitalizations.  Patient states that she has so many problems with her breathing due to not being able to afford her inhalers. She states they just do not have the money to pay for one inhaler that is $200 alone and pay bills and get her other medications.     Medications: Patient reports that she tries to take her medications as prescribed but has not utilized her inhalers as ordered due to cost.     Appointments: Patient has follow up appointment with Dr. Joya Gaskins scheduled and her husband will take her.     Advanced Directives: Patient states she and her husband are working on their advanced directives.  She declined needing any assistance with completion.     Plan: RN CM will refer patient to pharmacy for medication assistance.   RN CM will sign off case at this time.     [name], RN, MSN [facility] Care Management Care Management Coordinator Direct Line [phone] Toll Free: [phone]  Fax: [phone]

## 2019-04-17 ENCOUNTER — Telehealth: Payer: Self-pay | Admitting: Licensed Clinical Social Worker

## 2019-04-17 ENCOUNTER — Other Ambulatory Visit: Payer: Self-pay | Admitting: Pharmacist

## 2019-04-17 NOTE — Patient Outreach (Signed)
[place] [facility]) Care Management  04/17/2019  HANLEY RISPOLI Mar 29, 1951 [id number]   Patient was called regarding medication assistance. After describing who I was and the purpose of my call, patient asked that I call her back later or tomorrow because she was just coming back from the store and needed to use her nebulizer.  Plan: Call patient back in 1-2 business days.  [name], PharmD, [place] Clinical Pharmacist [phone]

## 2019-04-17 NOTE — Telephone Encounter (Signed)
Patient was contacted due to a referral, and she will be added to my schedule for 6/18 @ 10:00 via phone.

## 2019-04-18 ENCOUNTER — Ambulatory Visit: Payer: Self-pay | Admitting: Pharmacist

## 2019-04-18 ENCOUNTER — Other Ambulatory Visit: Payer: Self-pay | Admitting: Pharmacist

## 2019-04-18 NOTE — Patient Outreach (Signed)
[place] [facility]) Care Management  04/18/2019  [name] May 13, 1951 [id number]   Patient was called regarding medication assistance. Unfortunately, she did not answer the phone. HIPAA compliant message could not be left on her voicemail as her mailbox was full.  Plan: Send unsuccessful outreach letter. Call patient back in 5-7 business days.   [name], PharmD, [place] Clinical Pharmacist [phone]

## 2019-04-24 ENCOUNTER — Inpatient Hospital Stay: Payer: Medicare Other | Admitting: Critical Care Medicine

## 2019-04-24 ENCOUNTER — Other Ambulatory Visit: Payer: Self-pay

## 2019-04-24 ENCOUNTER — Ambulatory Visit (INDEPENDENT_AMBULATORY_CARE_PROVIDER_SITE_OTHER): Payer: Medicare Other | Admitting: Internal Medicine

## 2019-04-24 ENCOUNTER — Encounter: Payer: Self-pay | Admitting: Internal Medicine

## 2019-04-24 DIAGNOSIS — Z7951 Long term (current) use of inhaled steroids: Secondary | ICD-10-CM

## 2019-04-24 DIAGNOSIS — F17210 Nicotine dependence, cigarettes, uncomplicated: Secondary | ICD-10-CM | POA: Diagnosis not present

## 2019-04-24 DIAGNOSIS — J449 Chronic obstructive pulmonary disease, unspecified: Secondary | ICD-10-CM

## 2019-04-24 DIAGNOSIS — F419 Anxiety disorder, unspecified: Secondary | ICD-10-CM

## 2019-04-24 NOTE — Patient Instructions (Addendum)
Ms. Macfadden, It was a pleasure seeing you! I'm glad you are feeling better.  Keep up the good work with your inhaler regimen, as this will certainly help decrease your COPD exacerbations. I hope Dr. Maudie Mercury is able to assist you with affordability, as I know these can be expensive.   Please follow-up with your PCP in 1-2 months for routine Diabetes follow-up.   Feel free to call any time with questions or concerns.   Take care, Dr. Koleen Distance

## 2019-04-24 NOTE — Assessment & Plan Note (Signed)
Patient frequently admitted for exacerbations, most recently from 5/29-6/2. She completed 12 day Prednisone taper and 7 day course of Doxycyline. Her breathing is now at baseline. She has good air movement without wheezes on exam.  She was encouraged to continue taking both Advair and Spiriva daily, which she has struggled to be compliant with in the past.  - continue Advair, Spiriva  - Mucinex-DM - Flonase

## 2019-04-24 NOTE — Assessment & Plan Note (Signed)
>>  ASSESSMENT AND PLAN FOR CHRONIC OBSTRUCTIVE PULMONARY DISEASE WITH BRONCHOSPASM ([place]) WRITTEN ON 04/24/2019 12:02 PM BY BLOOMFIELD, CARLEY D, DO  Patient frequently admitted for exacerbations, most recently from 5/29-6/2. She completed 12 day Prednisone taper and 7 day course of Doxycyline. Her breathing is now at baseline. She has good air movement without wheezes on exam.  She was encouraged to continue taking both Advair and Spiriva daily, which she has struggled to be compliant with in the past.  - continue Advair, Spiriva  - Mucinex-DM - Flonase

## 2019-04-24 NOTE — Progress Notes (Signed)
   CC: COPD hospital follow-up   HPI:  [name] is a 68 y.o. female with history of anxiety and COPD with frequent exacerbations who presents for hospital follow-up for COPD exacerbation. She completed a 12 day Prednisone taper and 7 day course of Doxycyline. She has been feeling much better since discharge. Her breathing has returned to baseline. She was educated while in the hospital on importance of using both Spiriva and Advair on a daily basis which she endorses is going better.  Denies fevers, chills, headaches, light headedness, sore throat, cough above baseline.   Past Medical History:  Diagnosis Date  . Anxiety   . Arthritis    "fingers, knees" (08/16/2018)  . Asthma   . Cancer of right breast ([place]) 1991   s/p lumpectomy, chemotherapy and radiation therapy in 1991. Mammogram in 2007 was normal.  . Constipated    h/o  . COPD (chronic obstructive pulmonary disease) ([place])    History of multiple hospital admissions for exercabation   . COPD exacerbation ([place]) 01/01/2019  . COPD with acute exacerbation ([place]) 01/14/2019  . COPD with exacerbation ([place]) 04/06/2009   Qualifier: Diagnosis of  By: Eyvonne Mechanic MD, Vijay    . Depression   . Diarrhea    h/o  . GERD (gastroesophageal reflux disease)   . Headache    "a few times/month" (08/16/2018  . Heart murmur 10/05/11   "first time I ever heard I had one was today"  . Hyperlipidemia   . Hypertension   . Obesity   . Personal history of chemotherapy   . Personal history of radiation therapy   . Pneumonia    "couple times in the last 10-15 yrs" (08/16/2018)  . Shortness of breath 10/05/11   "at rest; lying down; w/exertion"  . Sigmoid diverticulitis 80/2008  . Tobacco abuse   . Type II diabetes mellitus ([place])    Review of Systems: Review of Systems  All other systems reviewed and are negative.   Physical Exam:  Vitals:   04/24/19 0924  BP: 134/64  Pulse: 96  Temp: 98.9 F (37.2 C)  TempSrc: Oral  Weight: 205 lb  1.6 oz (93 kg)   Physical Exam Constitutional:      General: She is not in acute distress.    Appearance: Normal appearance.  Eyes:     Conjunctiva/sclera: Conjunctivae normal.  Cardiovascular:     Rate and Rhythm: Normal rate and regular rhythm.  Pulmonary:     Effort: Pulmonary effort is normal.     Breath sounds: Normal breath sounds. No wheezing.  Neurological:     Mental Status: She is alert.  Psychiatric:        Mood and Affect: Mood normal.        Behavior: Behavior normal.     Assessment & Plan:   See Encounters Tab for problem based charting.  Patient discussed with Dr. Angelia Mould

## 2019-04-26 ENCOUNTER — Ambulatory Visit: Payer: Medicare Other | Admitting: Licensed Clinical Social Worker

## 2019-04-26 ENCOUNTER — Telehealth: Payer: Self-pay | Admitting: Licensed Clinical Social Worker

## 2019-04-26 NOTE — Telephone Encounter (Signed)
Patient was called twice for her scheduled appointment. Patient did not answer, and her voicemail box was full.

## 2019-04-30 NOTE — Progress Notes (Signed)
Internal Medicine Clinic Attending  Case discussed with Dr. Bloomfield at the time of the visit.  We reviewed the resident's history and exam and pertinent patient test results.  I agree with the assessment, diagnosis, and plan of care documented in the resident's note.  

## 2019-05-01 ENCOUNTER — Telehealth: Payer: Self-pay | Admitting: Pharmacist

## 2019-05-01 DIAGNOSIS — J449 Chronic obstructive pulmonary disease, unspecified: Secondary | ICD-10-CM

## 2019-05-01 MED ORDER — SPIRIVA RESPIMAT 2.5 MCG/ACT IN AERS: 2.0000 | INHALATION_SPRAY | Freq: Every day | RESPIRATORY_TRACT | 11 refills | Status: DC

## 2019-05-01 NOTE — Progress Notes (Signed)
Free trial program for Spiriva through [facility], prescription sent to pharmacy.

## 2019-05-02 ENCOUNTER — Other Ambulatory Visit: Payer: Self-pay | Admitting: Pharmacist

## 2019-05-02 ENCOUNTER — Ambulatory Visit: Payer: Self-pay | Admitting: Pharmacist

## 2019-05-02 NOTE — Patient Outreach (Addendum)
[place] [facility]) Care Management  05/02/2019  PENNY ARRAMBIDE 1951/01/14 [id number]   Patient was called regarding medication assistance. Unfortunately, she did not answer the phone. HIPAA compliant message could not be left on her voicemail as her mailbox was full. Patient was sent an unsuccessful outreach letter on 04/18/2019.  Plan: Close patient's pharmacy case as today's call was the third unsuccessful call.   [name], PharmD, BCACP [facility] Clinical Pharmacist [phone]  ADDENDUM  Patient called back at the end of the business day and left a message on my voicemail.    Plan: Call patient back with in 2-3 business days.  [name], PharmD, [place] Clinical Pharmacist [phone]

## 2019-05-04 ENCOUNTER — Encounter: Payer: Self-pay | Admitting: Licensed Clinical Social Worker

## 2019-05-04 ENCOUNTER — Other Ambulatory Visit: Payer: Self-pay | Admitting: Pharmacist

## 2019-05-04 NOTE — Patient Outreach (Addendum)
[place] [facility]) Care Management  [place]   05/04/2019  [name] [birth year] [id number]  Reason for referral: medication assistance  Referral source: Primary Care Physician Referral medication(s): Spiriva Current insurance: [employer]   HPI:  Allergic rhinitis, anxiety, depression, atopic dermatitis, COPD, hyperlipidemia, type 2 diabetes, GAD, hypertension, history of breast cancer and osteoarthritis of right knee.   Objective: Allergies  Allergen Reactions  . Ace Inhibitors Swelling    Throat swelling. Lisinopril-HCTZ Pt states she in not allergic   . Flonase [Fluticasone] Other (See Comments)    Sinuses stop up and condition worsens    Medications Reviewed Today    Reviewed by [name], [facility] (Pharmacist) on 05/04/19 at 0955  Med List Status: <None>  Medication Order Taking? Sig Documenting Provider Last Dose Status Informant  albuterol (VENTOLIN HFA) 108 (90 Base) MCG/ACT inhaler [id number] Yes Inhale 2 puffs into the lungs every 6 (six) hours as needed for wheezing or shortness of breath. [name], MD Taking Active Self  amLODipine (NORVASC) 10 MG tablet [id number] Yes Take 1 tablet (10 mg total) by mouth daily. [name], MD Taking Active Self  benzonatate (TESSALON) 100 MG capsule [id number] Yes Take 1 capsule (100 mg total) by mouth 3 (three) times daily as needed for cough. [name], MD Taking Active Self           Med Note [name]   Fri May 04, 2019  9:47 AM)    Blood Glucose Monitoring Suppl [facility] VERIO) w/Device KIT [id number] Yes Use to check blood sugar once daily. (E11.8) [name], MD Taking Active Self       Patient not taking:      Discontinued 05/04/19 0949 (Patient Preference)   DULoxetine (CYMBALTA) 60 MG capsule [id number] Yes Take 1 capsule (60 mg total) by mouth daily. [name], MD Taking Active Self  fluticasone [name]) 50 MCG/ACT nasal spray  [id number] Yes Place 1 spray into both nostrils daily as needed for allergies or rhinitis. [name], [name], MD Taking Active Self  Fluticasone-Salmeterol (ADVAIR) 250-50 MCG/DOSE AEPB [id number] Yes Inhale 1 puff into the lungs 2 (two) times daily. [name], MD Taking Active Self           Med Note [name], [name]   Fri Apr 06, 2019 10:06 AM) Pt thinks this is doing something to her mouth and isn't doing it regularly  glucose blood (ONETOUCH VERIO) test strip [id number] Yes Use to check blood sugar once daily. (E11.8) [name], MD Taking Active Self  hydrochlorothiazide (HYDRODIURIL) 25 MG tablet [id number] Yes Take 1 tablet (25 mg total) by mouth daily. [name], MD Taking Active Self  ibuprofen (ADVIL) 800 MG tablet [id number] Yes Take 1 tablet (800 mg total) by mouth every 8 (eight) hours as needed for moderate pain (Take with food). [name], [name], MD Taking Active Self  ipratropium-albuterol (DUONEB) 0.5-2.5 (3) MG/3ML SOLN [id number] Yes Take 3 mLs by nebulization every 6 (six) hours as needed. [name], MD Taking Active Self  losartan (COZAAR) 100 MG tablet [id number] Yes Take 1 tablet (100 mg total) by mouth daily. [name], MD Taking Active Self  metFORMIN (GLUCOPHAGE) 1000 MG tablet [id number] Yes Take 1 tablet (1,000 mg total) by mouth 2 (two) times daily. [name], MD Taking Active Self  metoprolol tartrate (LOPRESSOR) 25 MG tablet [id number] Yes Take 1 tablet (25 mg total) by mouth 2 (two) times daily.  [name], MD Taking Active Self  OneTouch Delica Lancets 76A MISC [id number] Yes Use to check blood sugar once daily (E11.8) [name], MD Taking Active Self        Discontinued 05/04/19 [phone] (Completed Course)   simvastatin (ZOCOR) 40 MG tablet [id number] Yes Take 1 tablet (40 mg total) by mouth daily at 6 PM. [name], MD Taking Active Self  Tiotropium Bromide Monohydrate (SPIRIVA RESPIMAT) 2.5 MCG/ACT  AERS [id number] Yes Inhale 2 puffs into the lungs daily. [name], MD Taking Active           Assessment:  Drugs sorted by system:  Neurologic/Psychologic: Duloxetine,  Cardiovascular: Amlodipine, HCTZ, Losartan, Metoprolol, Simvastatin,   Pulmonary/Allergy: Albuterol HFA, Benzonatate, Fluticasone Nasal Spray, Advair, Ipratropium-Albuterol nebulizer solution, Spiriva  Endocrine: Metformin,   Pain: Ibuprofen  Medication Review Findings:  Potential dose too high-Plasma concentrations and pharmacologic effects of simvastatin may be increased by amlodipine. The daily dose of simvastatin should not exceed 20 mg in patients receiving amlodipine according to official package labeling.    Medication Assistance Findings:  Medication assistance needs identified: Spiriva, Advair and  Albuterol HFA   Patient appears to meet the financial criteria to receive Spiriva from [employer]'s patient assistance program.  However, she said she completed paperwork for "patient assistance" and left them and her financial documentation on the desk of [name], PharmD.  [name] was inboxed about the forms to prevent unnecessary duplication.  It is unclear how much the patient has spent year to date in out-of-pocket medication expenses.  Advair is available from [place] Patient Assistance program but their program requires patients to spend at least $600 on medications to qualify.  Since patient is taking Spiriva and Advair--Trelegy may also be an option.  It is available from [employer] as well.  Proventil HFA (Albuterol) is available through [facility] Patient Assistance Program. If she has not spent the required amount to receive Advair from [employer], [name] is available through [employer]. (If a therapeutic substitution is made by her provider).  Additional medication assistance options reviewed with patient as warranted:   No other options identified  Plan: Await a call or message back from [name], PharmD in reference to patient assistance applications.  Once I hear from [name], send necessary applications or close pharmacy case.  Follow up in 2-3 business days.   [name], PharmD, [place] Clinical Pharmacist [phone]  ADDENDUM  [name], PharmD sent me a message back that she helped the patient apply for Extra Help but did not apply for patient assistance programs.  Plan: Route letter to [facility], to send applications to patient. Call patient back within 1-2 business days to let her know the applications will be sent to her in the mail.  Route note to PCP about simvastatin dose and patient assistance.  [name], PharmD, [place] Clinical Pharmacist [phone]

## 2019-05-08 ENCOUNTER — Other Ambulatory Visit: Payer: Self-pay | Admitting: Pharmacy Technician

## 2019-05-08 NOTE — Patient Outreach (Signed)
[place] [facility]) Care Management  05/08/2019  [name] Sep 26, 1951 [id number]                           Medication Assistance Referral  Referral From: [place]  Medication/Company: [employer]ann [employer]  / BI Patient application portion: Faxed  Provider application portion: Faxed  to Dr Asencion Noble  Medication/Company: Proventil HFA / [employer] Patient application portion:  Mailed Provider application portion: Interoffice Mailed to Dr Asencion Noble  Medication/Company: Advair / [place] Patient application portion:  Mailed Provider application portion: Faxed  to Dr Asencion Noble   Follow up:  Will follow up with patient in 5-10 business days to confirm application(s) have been received.  Yulian Gosney P. Deland Slocumb, [place] Management [phone]

## 2019-05-09 MED ORDER — DULERA 200-5 MCG/ACT IN AERO: 1.0000 | INHALATION_SPRAY | Freq: Two times a day (BID) | RESPIRATORY_TRACT | 11 refills | Status: DC

## 2019-05-09 NOTE — Addendum Note (Signed)
Addended by: Forde Dandy on: 05/09/2019 11:33 AM   Modules accepted: Orders

## 2019-05-10 ENCOUNTER — Telehealth: Payer: Self-pay | Admitting: *Deleted

## 2019-05-10 NOTE — Telephone Encounter (Addendum)
Call to [employer] for PA for [facility] .  Information given.  Dulera was approved 05/10/2019 thru 11/08/2019.   Sander Nephew, RN 05/10/2019 4:40 PM.

## 2019-05-15 ENCOUNTER — Telehealth: Payer: Self-pay | Admitting: Licensed Clinical Social Worker

## 2019-05-15 NOTE — Telephone Encounter (Signed)
Patient was called (3rd attempt) to establish care due to a referral from her PCP. Patient agreed to schedule a phone session on 7/9.

## 2019-05-17 ENCOUNTER — Ambulatory Visit (INDEPENDENT_AMBULATORY_CARE_PROVIDER_SITE_OTHER): Payer: Medicare Other | Admitting: Licensed Clinical Social Worker

## 2019-05-17 ENCOUNTER — Other Ambulatory Visit: Payer: Self-pay

## 2019-05-17 ENCOUNTER — Encounter: Payer: Self-pay | Admitting: Licensed Clinical Social Worker

## 2019-05-17 DIAGNOSIS — F411 Generalized anxiety disorder: Secondary | ICD-10-CM

## 2019-05-17 NOTE — BH Specialist Note (Signed)
Integrated Behavioral Health Visit via Telemedicine (Telephone)  05/17/2019 [name] [id number]   Session Start time: 11:05  Session End time: 11:30 Total time: 25 minutes  Referring Provider: Dr. Maudie Mercury Type of Visit: Telephonic Patient location: Home [facility] Provider location: Remote All persons participating in visit: [facility], and patient   Confirmed patient's address: Yes  Confirmed patient's phone number: Yes  Any changes to demographics: No   Discussed confidentiality: Yes    The following statements were read to the patient and/or legal guardian that are established with the [facility] Provider.  "The purpose of this phone visit is to provide behavioral health care while limiting exposure to the coronavirus (COVID19).  There is a possibility of technology failure and discussed alternative modes of communication if that failure occurs."  "By engaging in this telephone visit, you consent to the provision of healthcare.  Additionally, you authorize for your insurance to be billed for the services provided during this telephone visit."   Patient and/or legal guardian consented to telephone visit: Yes   PRESENTING CONCERNS: Patient and/or family reports the following symptoms/concerns: anxiety, health issues, and panic attacks.   Severity of problem: mild  STRENGTHS (Protective Factors/Coping Skills): Enjoying crafts  GOALS ADDRESSED: Patient will: 1.  Reduce symptoms of: anxiety  2.  Increase knowledge and/or ability of: coping skills, healthy habits and stress reduction  3.  Demonstrate ability to: Increase healthy adjustment to current life circumstances and Increase adequate support systems for patient/family  INTERVENTIONS: Interventions utilized:  Mindfulness or Relaxation Training, Brief CBT and Supportive Counseling Standardized Assessments completed: assessed for SI, HI, and self-harm.  ASSESSMENT: Patient currently experiencing mild anxiety and panic  attacks. Patient reported that she has anxious thoughts, especially related to her health. Patient worries about all different types of thoughts, but reported it is not daily. Patient has healthy coping skills (calling friends, challenging anxious thoughts, and crafts). Patient's most recent panic attack was around 8 days ago due to challenges with her ear (fear of losing her hearing). Patient acknowledged that she will jump to panic mode, and allow fear to take over. Patient processed ways to challenge fears and emotional thoughts.   Patient may benefit from bi-weekly outpatient counseling.  PLAN: 1. Follow up with behavioral health clinician on : one week via phone.  Dessie Coma, [facility], [place]

## 2019-05-18 ENCOUNTER — Ambulatory Visit: Payer: Self-pay | Admitting: Pharmacist

## 2019-05-18 ENCOUNTER — Telehealth: Payer: Self-pay

## 2019-05-18 DIAGNOSIS — J449 Chronic obstructive pulmonary disease, unspecified: Secondary | ICD-10-CM

## 2019-05-18 MED ORDER — ALBUTEROL SULFATE HFA 108 (90 BASE) MCG/ACT IN AERS: 1.0000 | INHALATION_SPRAY | Freq: Four times a day (QID) | RESPIRATORY_TRACT | 3 refills | Status: DC | PRN

## 2019-05-18 NOTE — Telephone Encounter (Signed)
Patient was contacted by Lorenso Courier, PharmD candidate. I agree with the assessment and plan of care documented.

## 2019-05-18 NOTE — Telephone Encounter (Signed)
[name] is a 68 y.o. female reports to clinical pharmacist appointment for COPD medication management.   Patient's current COPD medication regimen consists of: patient reports being out of albuterol inhaler, has lots of different inhalers from the hospital and from the clinic, Spiriva is costly - needs help with inhaler technique & starts coughing when she taking it; What she has at the moment: Advair for 3 months, no rescue inhaler, Breo from the clinic, Spiriva - but can't take a full dose - needs help with technique, albuterol nebulizer, Trelegy has yet to use.  Currently taking per doctor from hospital the Advair, Spiriva, and Albuterol nebulizer & regular. The more they change the inhalers the more expensive it gets.  Patient reports COPD related symptoms on and off.  Can't hear out of one of her ears - has an appointment with clinic on 7/13 for sinuses. Can't taste or smell or hear. Has difficulty blowing nose due to sinuses being so full. Any OTC she can take?  Patient reports use of rescue inhaler with an estimated use at about 2-3 times a day.  Patient reports occurrence of a COPD exacerbation since last appointment - last hospitalization.  Patient reports currently smoking 0.5 packs/day - says she had a patch in the hospital but caused panic attack - given too strong of a patch. Wants to do it on her own, but she will contact when she needs help. Very motivated in smoking cessation for family.  Allergies  Allergen Reactions  . Ace Inhibitors Swelling    Throat swelling. Lisinopril-HCTZ Pt states she in not allergic   . Flonase [Fluticasone] Other (See Comments)    Sinuses stop up and condition worsens    Current Outpatient Medications:  .  albuterol (VENTOLIN HFA) 108 (90 Base) MCG/ACT inhaler, Inhale 2 puffs into the lungs every 6 (six) hours as needed for wheezing or shortness of breath., Disp: 1 Inhaler, Rfl: 2 .  amLODipine (NORVASC) 10 MG tablet, Take 1 tablet  (10 mg total) by mouth daily., Disp: 90 tablet, Rfl: 1 .  benzonatate (TESSALON) 100 MG capsule, Take 1 capsule (100 mg total) by mouth 3 (three) times daily as needed for cough., Disp: 20 capsule, Rfl: 0 .  Blood Glucose Monitoring Suppl (ONETOUCH VERIO) w/Device KIT, Use to check blood sugar once daily. (E11.8), Disp: 1 kit, Rfl: 0 .  DULoxetine (CYMBALTA) 60 MG capsule, Take 1 capsule (60 mg total) by mouth daily., Disp: 30 capsule, Rfl: 2 .  fluticasone (FLONASE) 50 MCG/ACT nasal spray, Place 1 spray into both nostrils daily as needed for allergies or rhinitis., Disp: 16 g, Rfl: 2 .  glucose blood (ONETOUCH VERIO) test strip, Use to check blood sugar once daily. (E11.8), Disp: 100 each, Rfl: 11 .  hydrochlorothiazide (HYDRODIURIL) 25 MG tablet, Take 1 tablet (25 mg total) by mouth daily., Disp: 90 tablet, Rfl: 1 .  ibuprofen (ADVIL) 800 MG tablet, Take 1 tablet (800 mg total) by mouth every 8 (eight) hours as needed for moderate pain (Take with food)., Disp: 15 tablet, Rfl: 0 .  ipratropium-albuterol (DUONEB) 0.5-2.5 (3) MG/3ML SOLN, Take 3 mLs by nebulization every 6 (six) hours as needed., Disp: 360 mL, Rfl: 3 .  losartan (COZAAR) 100 MG tablet, Take 1 tablet (100 mg total) by mouth daily., Disp: 90 tablet, Rfl: 1 .  metFORMIN (GLUCOPHAGE) 1000 MG tablet, Take 1 tablet (1,000 mg total) by mouth 2 (two) times daily., Disp: 180 tablet, Rfl: 1 .  metoprolol tartrate (LOPRESSOR)  25 MG tablet, Take 1 tablet (25 mg total) by mouth 2 (two) times daily., Disp: 120 tablet, Rfl: 1 .  mometasone-formoterol (DULERA) 200-5 MCG/ACT AERO, Inhale 1 puff into the lungs 2 (two) times a day., Disp: 13 g, Rfl: 11 .  OneTouch Delica Lancets 03E MISC, Use to check blood sugar once daily (E11.8), Disp: 100 each, Rfl: 11 .  simvastatin (ZOCOR) 40 MG tablet, Take 1 tablet (40 mg total) by mouth daily at 6 PM., Disp: 90 tablet, Rfl: 1 .  Tiotropium Bromide Monohydrate (SPIRIVA RESPIMAT) 2.5 MCG/ACT AERS, Inhale 2 puffs  into the lungs daily., Disp: 4 g, Rfl: 11 Past Medical History:  Diagnosis Date  . Anxiety   . Arthritis    "fingers, knees" (08/16/2018)  . Asthma   . Cancer of right breast ([place]) 1991   s/p lumpectomy, chemotherapy and radiation therapy in 1991. Mammogram in 2007 was normal.  . Constipated    h/o  . COPD (chronic obstructive pulmonary disease) ([place])    History of multiple hospital admissions for exercabation   . COPD exacerbation ([place]) 01/01/2019  . COPD with acute exacerbation ([place]) 01/14/2019  . COPD with exacerbation ([place]) 04/06/2009   Qualifier: Diagnosis of  By: Eyvonne Mechanic MD, Vijay    . Depression   . Diarrhea    h/o  . GERD (gastroesophageal reflux disease)   . Headache    "a few times/month" (08/16/2018  . Heart murmur 10/05/11   "first time I ever heard I had one was today"  . Hyperlipidemia   . Hypertension   . Obesity   . Personal history of chemotherapy   . Personal history of radiation therapy   . Pneumonia    "couple times in the last 10-15 yrs" (08/16/2018)  . Shortness of breath 10/05/11   "at rest; lying down; w/exertion"  . Sigmoid diverticulitis 80/2008  . Tobacco abuse   . Type II diabetes mellitus ([place])    Social History   Socioeconomic History  . Marital status: Married    Spouse name: Not on file  . Number of children: Not on file  . Years of education: 63  . Highest education level: Not on file  Occupational History    Employer: UNEMPLOYED  Social Needs  . Financial resource strain: Not on file  . Food insecurity    Worry: Not on file    Inability: Not on file  . Transportation needs    Medical: Not on file    Non-medical: Not on file  Tobacco Use  . Smoking status: Current Every Day Smoker    Packs/day: 0.50    Years: 45.00    Pack years: 22.50    Types: Cigarettes  . Smokeless tobacco: Never Used  Substance and Sexual Activity  . Alcohol use: Yes    Alcohol/week: 4.0 standard drinks    Types: 4 Cans of beer per week    Comment:  08/16/2018 "weekends only"  . Drug use: No  . Sexual activity: Yes  Lifestyle  . Physical activity    Days per week: Not on file    Minutes per session: Not on file  . Stress: Not on file  Relationships  . Social Herbalist on phone: Not on file    Gets together: Not on file    Attends religious service: Not on file    Active member of club or organization: Not on file    Attends meetings of clubs or organizations: Not on file  Relationship status: Not on file  Other Topics Concern  . Not on file  Social History Narrative   Lived in [place] with her husband.   Takes care of 3 grand children.   Trying to find a job, has financial difficulties.      Family History  Problem Relation Age of Onset  . Cancer Mother     O: Ht Readings from Last 2 Encounters:  04/06/19 '5\' 6"'  (1.676 m)  03/19/19 '5\' 6"'  (1.676 m)   Wt Readings from Last 2 Encounters:  04/24/19 205 lb 1.6 oz (93 kg)  04/10/19 210 lb 6.4 oz (95.4 kg)   There is no height or weight on file to calculate BMI.  CAT ASSESSMENT  Rank each of the following items on a scale of 0 to 5 (with 5 being most severe) Write a # 0-5 in each box  I never cough (0) > I cough all the time (5) 2.5  I have no phlegm (mucus) in my chest (0) > My chest is completely full of phlegm (mucus) (5) 0  My chest does not feel tight at all (0) > My chest feels very tight (5) 0  When I walk up a hill or one flight of stairs I am not breathless (0) > When I walk up a hill or one flight of stairs I am very breathless (5) 1  I am not limited doing any activities at home (0) > I am very limited doing activities at home (5) 0  I am confident leaving my home despite my lung function (0) > I am not at all confident leaving my home because of my lung condition (5)  1 (without rescue inhaler it is a 5)  I sleep soundly (0) > I don't sleep soundly because of my lung condition (5) 0 (not lately due to sinuses)  I have lots of energy (0) > I have  no energy at all (5) 0    Total CAT Score: 4.5 Most recent blood eosinophil count was 3 cells/microL taken on 04/06/2019.   An after visit summary was provided and patient advised to follow up in 4-6 weeks or sooner if any changes in condition or questions regarding medications arise.  Patient interested in meeting with pharmacy on 7/13 after she comes to clinic to work on Spiriva technique and go through all her inhalers.  Told patient I would call her back if Dr. Maudie Mercury has an OTC recommendation to help her get through the weekend. Afrin & fluticasone for the weekend and then stop Afrin on Monday, but continue the fluticasone to help with the congestion.  Mouth is dry due to some inhalers. Says she is rinsing and spitting, but she might not be spitting so will try to start doing that.   The patient verbalized understanding of information provided by repeating back concepts discussed.

## 2019-05-21 ENCOUNTER — Ambulatory Visit: Payer: Medicare Other | Admitting: Pharmacist

## 2019-05-21 ENCOUNTER — Encounter: Payer: Self-pay | Admitting: Internal Medicine

## 2019-05-21 ENCOUNTER — Other Ambulatory Visit: Payer: Self-pay | Admitting: Pharmacy Technician

## 2019-05-21 ENCOUNTER — Other Ambulatory Visit: Payer: Self-pay

## 2019-05-21 ENCOUNTER — Ambulatory Visit (INDEPENDENT_AMBULATORY_CARE_PROVIDER_SITE_OTHER): Payer: Medicare Other | Admitting: Internal Medicine

## 2019-05-21 VITALS — BP 154/74 | HR 93 | Temp 99.1°F | Ht 66.5 in | Wt 208.2 lb

## 2019-05-21 DIAGNOSIS — Z79899 Other long term (current) drug therapy: Secondary | ICD-10-CM

## 2019-05-21 DIAGNOSIS — I10 Essential (primary) hypertension: Secondary | ICD-10-CM | POA: Diagnosis not present

## 2019-05-21 DIAGNOSIS — F17210 Nicotine dependence, cigarettes, uncomplicated: Secondary | ICD-10-CM

## 2019-05-21 DIAGNOSIS — E1140 Type 2 diabetes mellitus with diabetic neuropathy, unspecified: Secondary | ICD-10-CM | POA: Diagnosis not present

## 2019-05-21 DIAGNOSIS — M1711 Unilateral primary osteoarthritis, right knee: Secondary | ICD-10-CM

## 2019-05-21 DIAGNOSIS — J019 Acute sinusitis, unspecified: Secondary | ICD-10-CM

## 2019-05-21 DIAGNOSIS — E1142 Type 2 diabetes mellitus with diabetic polyneuropathy: Secondary | ICD-10-CM

## 2019-05-21 MED ORDER — AMOXICILLIN-POT CLAVULANATE 875-125 MG PO TABS: 1.0000 | ORAL_TABLET | Freq: Two times a day (BID) | ORAL | 0 refills | Status: AC

## 2019-05-21 MED ORDER — GABAPENTIN 300 MG PO CAPS: 300.0000 mg | ORAL_CAPSULE | Freq: Three times a day (TID) | ORAL | 1 refills | Status: DC

## 2019-05-21 MED ORDER — AMOXICILLIN-POT CLAVULANATE 875-125 MG PO TABS: 1.0000 | ORAL_TABLET | Freq: Two times a day (BID) | ORAL | 0 refills | Status: DC

## 2019-05-21 NOTE — Assessment & Plan Note (Signed)
2-week history of increasing nasal drainage, sinus congestion, and tinnitus.  She does endorse some cold-like symptoms preceding this.  Denies any objective fevers.  Does not feel like symptoms are improving.  She does have history of allergic sinusitis for which she takes Flonase.  She does feel like the symptoms are considered considerably different than her allergy type symptoms. We will give her course of antibiotics (Augmentin).  We can revisit and reevaluate this issue upon her next appointment in 4 weeks.

## 2019-05-21 NOTE — Assessment & Plan Note (Signed)
Right knee pain aggravated following her daughter's dog hitting his head against the anterior part of her knee.  That was about 2 weeks ago.  Last knee injection was in December 2019.  Steroid injection today.  Please see note for procedure details.

## 2019-05-21 NOTE — Patient Instructions (Signed)
Regarding your sinus discomfort and ear ringing. This is likely a result of an infection that you have or had in your sinuses. Given the length of time that it has been going on for, we will give you some antibiotics to clear up a sinus infection that you may have.  Regarding your foot pain, this is likely secondary to your diabetes and is what we call diabetic neuropathy. We wills start you on gabapentin for this. Please follow up with [place] in 4 weeks to let us know if it is working.  We also injected your right knee secondary with a  Steroid injection today.

## 2019-05-21 NOTE — Assessment & Plan Note (Signed)
[name] complains of bilateral plantar foot pain that is been worsening over the past few months.  Notes it feels like pins-and-needles and is worse at night.  Her diabetes is been relatively well controlled with her last A1c being around 6.6.  However we will trial her with some gabapentin with 300 mg 3 times daily which will be slowly increased over time if necessary.  No issues with renal function.  Follow-up in 4 weeks for reevaluation of this.

## 2019-05-21 NOTE — Patient Outreach (Signed)
[facility]) Care Management  05/21/2019  [name] 1951/10/13 [id number]   Successful outreach call placed to patient in regards to [place] application for [place], [employer] application for [employer] (or [place] for Advair).  Spoke to patient, HIPAA identifiers verified.  Patient informed she believes she has received the applications but she has not completed them yet. She informed she has not been feeling well. However, she informed she believes she can get them completed this week and mailed in.  Will followup with patient in 10-15 business days if application has not been received.  [name], [place] Management [phone]

## 2019-05-21 NOTE — Assessment & Plan Note (Signed)
Blood pressure is a little elevated today however likely secondary to anxiety regarding her daughter's surgery that is happening today.  She is following up in 4 weeks for another issue at which time we can reevaluate her blood pressure control.

## 2019-05-21 NOTE — Progress Notes (Signed)
CC: tinittus, right knee pain, b/l foot pain  HPI:  [name] is a 68 y.o. female who presents for several issues today. 1.  Tinnitus.  Has been going on for about 2 weeks following cold-like symptoms.  Since that time, she reports increased sinus congestion, and ringing in her left ear.  She denies any fevers however she has not been taking her temperature and does report feeling more warm than usual occasionally.  She does have a history of seasonal allergies for which she uses Flonase.  However this has not seemed to help her symptoms.  2.  Right knee pain.  History of osteoarthritis with regular steroid injections to her knees.  Her right knee pain was exacerbated following her dog hitting his head against her anterior knee.  There was no hyper extension or twisting motion involved.  Since that time, her knee has remained swollen and painful.  She denies any clicking or catching of the knee.  She is able to bear weight.  3.  Bilateral foot pain.  This is gradually worsened over the last few months and she reports it feeling like pins-and-needles at the bottom of her feet.  Worse at night.  She has tried several over-the-counter remedies without relief.  She does have a history of diabetes.  Does appear fairly well controlled--last A1c 6.6.   Past Medical History:  Diagnosis Date  . Anxiety   . Arthritis    "fingers, knees" (08/16/2018)  . Asthma   . Cancer of right breast ([place]) 1991   s/p lumpectomy, chemotherapy and radiation therapy in 1991. Mammogram in 2007 was normal.  . Constipated    h/o  . COPD (chronic obstructive pulmonary disease) ([place])    History of multiple hospital admissions for exercabation   . COPD exacerbation ([place]) 01/01/2019  . COPD with acute exacerbation ([place]) 01/14/2019  . COPD with exacerbation ([place]) 04/06/2009   Qualifier: Diagnosis of  By: Eyvonne Mechanic MD, Vijay    . Depression   . Diarrhea    h/o  . GERD (gastroesophageal reflux disease)   .  Headache    "a few times/month" (08/16/2018  . Heart murmur 10/05/11   "first time I ever heard I had one was today"  . Hyperlipidemia   . Hypertension   . Obesity   . Personal history of chemotherapy   . Personal history of radiation therapy   . Pneumonia    "couple times in the last 10-15 yrs" (08/16/2018)  . Shortness of breath 10/05/11   "at rest; lying down; w/exertion"  . Sigmoid diverticulitis 80/2008  . Tobacco abuse   . Type II diabetes mellitus ([place])    Review of Systems:  negative other than those stated in HPI  Physical Exam:  Vitals:   05/21/19 0900  BP: (!) 154/74  Pulse: 93  Temp: 99.1 F (37.3 C)  TempSrc: Oral  SpO2: 100%  Weight: 208 lb 3.2 oz (94.4 kg)  Height: 5' 6.5" (1.689 m)    GENERAL: well appearing, in no apparent distress HEENT: no conjunctival injection.  No maxillary or frontal sinus pain upon palpation..  Effusion of left tympanic membrane present.  No erythema of external auditory canal or tympanic membranes bilaterally.  No anterior lymphadenopathy appreciated. CARDIAC: heart regular rate and rhythm.  +1 edema of bilateral lower extremities. PULMONARY: lung sounds clear to auscultation ABDOMEN: bowel sounds active.  SKIN: no rash or lesion on limited exam NEURO: CN II-XII grossly intact Musculoskeletal: Mild effusion present on  right superior medial aspect of the knee.  Pain upon palpation of anterior knee.  Limited range of motion due to pain.  No crepitus or clicking felt with range of motion though.  Knee Injection Procedure Note  Diagnosis: right knee osteoarthritis  Indications: osteoarthritis  Anesthesia: Lidocaine 1% without epinephrine   Procedure Details   Point of care ultrasound was used to identify the joint effusion and plan needle trajectory and depth. Consent was obtained for the procedure. The joint was prepped with Betadine. A 22 gauge needle was inserted into the inferior medial aspect of the joint.  2 ml 1% lidocaine  and 1 ml of 40mg  Kenalog was then injected into the joint through the same needle. The needle was removed and the area cleansed and dressed. [place] tolerated the procedure well.   Complications:  None; patient tolerated the procedure well.   Assessment & Plan:   See Encounters Tab for problem based charting.  Pertinent labs & imaging results that were available during my care of the patient were reviewed by me and considered in my medical decision making  Patient is in agreement with the plan and endorses no further questions at this time.  Patient seen with Dr. Angelia Mould  Mitzi Hansen, MD Internal Medicine Resident-PGY1 05/21/19

## 2019-05-24 ENCOUNTER — Other Ambulatory Visit: Payer: Self-pay

## 2019-05-24 ENCOUNTER — Encounter: Payer: Self-pay | Admitting: Licensed Clinical Social Worker

## 2019-05-24 ENCOUNTER — Ambulatory Visit (INDEPENDENT_AMBULATORY_CARE_PROVIDER_SITE_OTHER): Payer: Medicare Other | Admitting: Licensed Clinical Social Worker

## 2019-05-24 DIAGNOSIS — F411 Generalized anxiety disorder: Secondary | ICD-10-CM

## 2019-05-24 NOTE — BH Specialist Note (Signed)
Integrated Behavioral Health Visit via Telemedicine (Telephone)  05/24/2019 [name] [id number]   Session Start time: 11:00  Session End time: 11:30 Total time: 30 minutes  Referring Provider: Dr. Maudie Mercury Type of Visit: Telephonic Patient location: Home [facility] Provider location: Remote All persons participating in visit: patient and [facility]  Confirmed patient's address: Yes  Confirmed patient's phone number: Yes  Any changes to demographics: No   Discussed confidentiality: Yes    The following statements were read to the patient and/or legal guardian that are established with the [facility] Provider.  "The purpose of this phone visit is to provide behavioral health care while limiting exposure to the coronavirus (COVID19).  There is a possibility of technology failure and discussed alternative modes of communication if that failure occurs."  "By engaging in this telephone visit, you consent to the provision of healthcare.  Additionally, you authorize for your insurance to be billed for the services provided during this telephone visit."   Patient and/or legal guardian consented to telephone visit: Yes   PRESENTING CONCERNS: Patient and/or family reports the following symptoms/concerns: anxiety, health issues, and panic attacks.  Duration of problem: recurrent; Severity of problem: mild  STRENGTHS (Protective Factors/Coping Skills): Enjoys crafts.   GOALS ADDRESSED: Patient will: 1.  Reduce symptoms of: anxiety, stress and sadness.  2.  Increase knowledge and/or ability of: coping skills, healthy habits and stress reduction  3.  Demonstrate ability to: Increase healthy adjustment to current life circumstances, Increase adequate support systems for patient/family and Improve medication compliance  INTERVENTIONS: Interventions utilized:  Mindfulness or Relaxation Training, Supportive Counseling and Medication Monitoring Standardized Assessments completed: assessed for  SI, HI, and self-harm.  ASSESSMENT: Patient currently experiencing mild anxiety and periods of feeling overwhelmed. Pt's mood was more anxious than the previous session.  Patient reported she is concerned about three of her prescriptions currently due to lack of fiances and inability to get them filled currently. Patient also reported that she cannot afford her prescription for the Duloxetine, and would like to find a more affordable option (patient was previously on Hydroxyzine, and reported she had positive results). Patient does not want to stop taking her medication for anxiety, and reported that she needs it to reduce her anxiety.   Patient is easily triggered by health concerns/fears. Patient reported she will panic when she begins having changes in her health. Patient will allow her fears to take over her thoughts, and recognized that she needs to slow down and challenge her fears. Patient was offered an appointment with [facility] due to fears related to her Diabetes.   Patient may benefit from bi-weekly outpatient therapy.  PLAN: 1. Follow up with behavioral health clinician on : two weeks via phone.   Dessie Coma, [facility], [place]

## 2019-05-25 ENCOUNTER — Telehealth: Payer: Self-pay | Admitting: Dietician

## 2019-05-25 NOTE — Telephone Encounter (Signed)
Calling patient at the request for [name] about her diabetes concerns. Left voicemail for return call and encouraged her to call the triage nurses if I am not available.

## 2019-05-25 NOTE — Telephone Encounter (Signed)
Wants brochures about healthy eating mailed to her and scheduled an appointment to discuss the information next Friday. Marland Kitchen

## 2019-05-26 NOTE — Telephone Encounter (Signed)
Yes I'd appreciate if you could put the referral in. Thank you.

## 2019-05-28 NOTE — Progress Notes (Signed)
Internal Medicine Clinic Attending  I saw and evaluated the patient.  I personally confirmed the key portions of the history and exam documented by Dr. Christian and I reviewed pertinent patient test results.  The assessment, diagnosis, and plan were formulated together and I agree with the documentation in the resident's note. I was present for the entire procedure. 

## 2019-05-29 ENCOUNTER — Telehealth: Payer: Self-pay | Admitting: Dietician

## 2019-05-29 DIAGNOSIS — E119 Type 2 diabetes mellitus without complications: Secondary | ICD-10-CM

## 2019-05-29 NOTE — Telephone Encounter (Signed)
Medical Nutrition Therapy Referral requested by patient. Approved by Dr. Truman Hayward.

## 2019-06-01 ENCOUNTER — Ambulatory Visit: Payer: Medicare Other | Admitting: Dietician

## 2019-06-01 ENCOUNTER — Other Ambulatory Visit: Payer: Self-pay

## 2019-06-01 NOTE — Progress Notes (Addendum)
No answer unable to leave a message. Debera Lat, RD 06/01/2019 10:42 AM.  Left voicemail for return call and asked patient to reschedule as needed.  Debera Lat, RD 06/05/2019 9:49 AM.    Mailed letter asking her to reschedule as needed. Debera Lat, RD 06/15/2019 9:58 AM.

## 2019-06-04 ENCOUNTER — Other Ambulatory Visit: Payer: Self-pay | Admitting: Pharmacy Technician

## 2019-06-04 NOTE — Patient Outreach (Signed)
[name]) Care Management  06/04/2019  [name] 1951/07/27 [id number]  Successful outreach call placed to patient in regards to [place] application for Spiriva and Merck application for [employer] and Pro[employer]il.  Spoke to patient, HIPAA identifiers verified.  Patient was not able to locate the applications while we were speaking on the phone. She was unsure if she got them or if she misplaced them. She informed she would look for them. She also requested if we could resend them out in case she is unable to locate them.  Will resend out the applications and followup with patient in 5-10 business days.  Layali Freund P. Annetta Deiss, [place] Management [phone]

## 2019-06-07 ENCOUNTER — Telehealth: Payer: Self-pay | Admitting: Licensed Clinical Social Worker

## 2019-06-07 ENCOUNTER — Ambulatory Visit: Payer: Medicare Other | Admitting: Licensed Clinical Social Worker

## 2019-06-07 NOTE — Telephone Encounter (Signed)
Patient was called for her scheduled appointment. Patient did not answer, and a vm was left for the patient to contact our office if she would like to reschedule.

## 2019-06-13 ENCOUNTER — Other Ambulatory Visit: Payer: Self-pay | Admitting: Pharmacy Technician

## 2019-06-13 NOTE — Patient Outreach (Signed)
[place] [facility]) Care Management  06/13/2019  [name] 15-Aug-1951 [id number]   Third outreach call placed to patient in regards to [employer] application for [employer] and Proventil and [place] application for Spiriva.  Spoke to patient, HIPAA identifiers verified.  Patient informed she has received the applications. Patient was not exactly sure when she would send the applications back in but she was going to aim for early next week. She informed she was headed out of town for her daughter's surgery tomorrow morning.  Will followup with patient in 10-15 business days if applications have not been received back.  Archita Lomeli P. Areona Homer, [place] Management [phone]

## 2019-06-15 ENCOUNTER — Telehealth: Payer: Self-pay | Admitting: Pharmacist

## 2019-06-15 ENCOUNTER — Encounter: Payer: Self-pay | Admitting: Dietician

## 2019-06-15 DIAGNOSIS — J449 Chronic obstructive pulmonary disease, unspecified: Secondary | ICD-10-CM

## 2019-06-15 DIAGNOSIS — I10 Essential (primary) hypertension: Secondary | ICD-10-CM

## 2019-06-15 MED ORDER — DULERA 200-5 MCG/ACT IN AERO: 1.0000 | INHALATION_SPRAY | Freq: Two times a day (BID) | RESPIRATORY_TRACT | 11 refills | Status: DC

## 2019-06-15 MED ORDER — METOPROLOL TARTRATE 25 MG PO TABS: 25.0000 mg | ORAL_TABLET | Freq: Two times a day (BID) | ORAL | 1 refills | Status: DC

## 2019-06-15 MED ORDER — SPIRIVA RESPIMAT 2.5 MCG/ACT IN AERS: 2.0000 | INHALATION_SPRAY | Freq: Every day | RESPIRATORY_TRACT | 11 refills | Status: DC

## 2019-06-15 NOTE — Progress Notes (Signed)
Contacted patient for follow up COPD inhaler support. Patient reports no symptoms of concerns at this time, is taking Spiriva and Dulera as prescribed. She states she is working with [facility] on patient assistance program paperwork for continued access. Advised patient she can bring paperwork to next clinic appointment 06/26/2019 and I can help facilitate. Patient verbalized understanding. Will provide inhaler samples while paperwork is pending.  She states her only concern at this time is the Spiriva device causing a cough. Advised patient to use a spacer device and will try to provide her with one in addition to samples at her next PCP appointment 8/18. Patient verbalized understanding. Phone call was brief due to patient caring for a sick loved one.  Will follow up with patient in 1 month.

## 2019-06-15 NOTE — Addendum Note (Signed)
Addended by: Forde Dandy on: 06/15/2019 05:24 PM   Modules accepted: Orders

## 2019-06-19 ENCOUNTER — Encounter: Payer: Medicare Other | Admitting: Internal Medicine

## 2019-06-22 ENCOUNTER — Other Ambulatory Visit: Payer: Self-pay

## 2019-06-22 ENCOUNTER — Ambulatory Visit (INDEPENDENT_AMBULATORY_CARE_PROVIDER_SITE_OTHER): Payer: Medicare Other | Admitting: Internal Medicine

## 2019-06-22 ENCOUNTER — Encounter: Payer: Self-pay | Admitting: Internal Medicine

## 2019-06-22 VITALS — BP 137/68 | HR 93 | Temp 98.6°F | Wt 208.2 lb

## 2019-06-22 DIAGNOSIS — J449 Chronic obstructive pulmonary disease, unspecified: Secondary | ICD-10-CM

## 2019-06-22 DIAGNOSIS — F329 Major depressive disorder, single episode, unspecified: Secondary | ICD-10-CM

## 2019-06-22 DIAGNOSIS — K219 Gastro-esophageal reflux disease without esophagitis: Secondary | ICD-10-CM

## 2019-06-22 DIAGNOSIS — E118 Type 2 diabetes mellitus with unspecified complications: Secondary | ICD-10-CM | POA: Diagnosis not present

## 2019-06-22 DIAGNOSIS — M1990 Unspecified osteoarthritis, unspecified site: Secondary | ICD-10-CM | POA: Diagnosis not present

## 2019-06-22 DIAGNOSIS — M25551 Pain in right hip: Secondary | ICD-10-CM | POA: Diagnosis not present

## 2019-06-22 DIAGNOSIS — Z853 Personal history of malignant neoplasm of breast: Secondary | ICD-10-CM

## 2019-06-22 DIAGNOSIS — F17210 Nicotine dependence, cigarettes, uncomplicated: Secondary | ICD-10-CM

## 2019-06-22 DIAGNOSIS — F419 Anxiety disorder, unspecified: Secondary | ICD-10-CM | POA: Diagnosis not present

## 2019-06-22 DIAGNOSIS — Z0000 Encounter for general adult medical examination without abnormal findings: Secondary | ICD-10-CM

## 2019-06-22 DIAGNOSIS — Z23 Encounter for immunization: Secondary | ICD-10-CM | POA: Diagnosis not present

## 2019-06-22 DIAGNOSIS — J329 Chronic sinusitis, unspecified: Secondary | ICD-10-CM | POA: Diagnosis not present

## 2019-06-22 DIAGNOSIS — G8929 Other chronic pain: Secondary | ICD-10-CM | POA: Insufficient documentation

## 2019-06-22 LAB — POCT GLYCOSYLATED HEMOGLOBIN (HGB A1C): Hemoglobin A1C: 6.5 % — AB (ref 4.0–5.6)

## 2019-06-22 LAB — GLUCOSE, CAPILLARY: Glucose-Capillary: 102 mg/dL — ABNORMAL HIGH (ref 70–99)

## 2019-06-22 MED ORDER — HYDROXYZINE PAMOATE 100 MG PO CAPS: 100.0000 mg | ORAL_CAPSULE | Freq: Three times a day (TID) | ORAL | 1 refills | Status: DC | PRN

## 2019-06-22 MED ORDER — NAPROXEN SODIUM 550 MG PO TABS: 550.0000 mg | ORAL_TABLET | Freq: Two times a day (BID) | ORAL | 0 refills | Status: AC

## 2019-06-22 NOTE — Assessment & Plan Note (Signed)
Hgb a1c 6.5 this visit. Used to be in pre-diabetes range but up to diabetic range after multiple doses of steroids due to COPD exacerbation. Also used to be on metformin but patient has stopped taking it. She states she feels medication burden due to taking 'so many meds' for her other chronic conditions.  - Will monitor and continue to encourage lifestyle modifications.

## 2019-06-22 NOTE — Assessment & Plan Note (Addendum)
Presents with acute onset R hip pain. Described as achy, anterior with radiation down to groin. On physical exam, tenderness and limited range of motion due to pain. Differential includes osteoarthritis vs early necrosis. Low likelihood of tendonitis or piriformis syndrome based on history. High risk of osteonecrosis due to repeated doses of high dose steroids for COPD exacerbations this year.  - Standing R hip X-ray - MRI if X-ray is negative - Will likely need referral to ortho (Used to follow w/ Dr.Dumonski) - Naproxen 550mg  BID Prn for pain

## 2019-06-22 NOTE — Progress Notes (Signed)
CC: R hip pain  HPI: [name] is a 68 y.o. F w/ PMH of COPD, Breast ca s/p lumpectomy, Osteoarthiris, GERD, and anxiety presenting with complaints of right hip pain. She states she was in her usual state of health until about a week ago when she experienced acute on chronic right hip pain without obvious inciting event. She describes the pain as achy intermittent 10/10 pain with radiation down to her groin and exacerbated by getting out of her chair. She mentions using Tylenol without significant improvement. She mentions having to walk much more slowly due to this pain. She cannot tell if it is worse during humid weather. She denies any warmth or swelling around the area. She denies any fevers, chills, nausea, vomiting, malaise.  Past Medical History:  Diagnosis Date  . Acute sinusitis 01/09/2019  . Anxiety   . Arthritis    "fingers, knees" (08/16/2018)  . Asthma   . Cancer of right breast ([place]) 1991   s/p lumpectomy, chemotherapy and radiation therapy in 1991. Mammogram in 2007 was normal.  . Constipated    h/o  . COPD (chronic obstructive pulmonary disease) ([place])    History of multiple hospital admissions for exercabation   . COPD exacerbation ([place]) 01/01/2019  . COPD with acute exacerbation ([place]) 01/14/2019  . COPD with exacerbation ([place]) 04/06/2009   Qualifier: Diagnosis of  By: Eyvonne Mechanic MD, Vijay    . Depression   . Diarrhea    h/o  . GERD (gastroesophageal reflux disease)   . Headache    "a few times/month" (08/16/2018  . Heart murmur 10/05/11   "first time I ever heard I had one was today"  . Hyperlipidemia   . Hypertension   . Lower extremity edema 09/21/2018  . Obesity   . Personal history of chemotherapy   . Personal history of radiation therapy   . Pneumonia    "couple times in the last 10-15 yrs" (08/16/2018)  . QT prolongation 08/08/2014  . Seasonal allergies 02/25/2017  . Shortness of breath 10/05/11   "at rest; lying down; w/exertion"  . Sigmoid  diverticulitis 80/2008  . Tobacco abuse   . Type 2 diabetes mellitus ([place]) 05/14/2009  . Type II diabetes mellitus ([place])    Review of Systems: Review of Systems  Constitutional: Negative for chills, fever and malaise/fatigue.  HENT: Positive for congestion and sinus pain. Negative for sore throat.   Respiratory: Negative for cough and shortness of breath.   Cardiovascular: Negative for chest pain, palpitations and leg swelling.  Musculoskeletal: Positive for joint pain. Negative for falls.  Skin: Positive for itching.  Neurological: Negative for dizziness and weakness.    Physical Exam: Vitals:   06/22/19 0947  BP: 137/68  Pulse: 93  Temp: 98.6 F (37 C)  TempSrc: Oral  Weight: 208 lb 3.2 oz (94.4 kg)   Physical Exam  Constitutional: She is oriented to person, place, and time. She appears well-developed and well-nourished. No distress.  HENT:  Swollen turbinates R>L with sinus drainage  Eyes: Conjunctivae are normal. No scleral icterus.  Neck: Normal range of motion. Neck supple.  Cardiovascular: Normal rate, regular rhythm, normal heart sounds and intact distal pulses.  No murmur heard. Respiratory: Effort normal. She has no wheezes.  Distant breath sounds  GI: Soft. Bowel sounds are normal. There is no abdominal tenderness.  Musculoskeletal:        General: Tenderness (R anterior hip tenderness to deep palpation. Active hip flexion, adduction about 15-20 degrees, limited  by pain. Passive flexion, adduction about 25-30 degrees. Minimal external/internal rotation. ) present.  Neurological: She is alert and oriented to person, place, and time.     Assessment & Plan:   Chronic recurrent sinusitis Continues to report sinus pressure and runny nose although somewhat improved compared to prior visit. She mentions that she has not yet had chance to f/u with ENT and does not think this is necessary at this time. She mentions that she remembers being prescribed a prescription spray  that has helped her in the past and is requesting additional script. On chart review, no other spray except for Flonase. Pharmacy confirms either Flonase or Nasacort as only prescribed nasal sprays. Discussed in detail regarding importance of sinus rinse. [name] states she is scared this will exacerbate her anxiety but states she will try.  - Instructions on sinus rinse provided  Acute right hip pain Presents with acute onset R hip pain. Described as achy, anterior with radiation down to groin. On physical exam, tenderness and limited range of motion due to pain. Differential includes osteoarthritis vs early necrosis. Low likelihood of tendonitis or piriformis syndrome based on history. High risk of osteonecrosis due to repeated doses of high dose steroids for COPD exacerbations this year.  - Standing R hip X-ray - MRI if X-ray is negative - Will likely need referral to ortho (Used to follow w/ Dr.Dumonski) - Naproxen 550mg  BID Prn for pain  Type 2 diabetes mellitus with complication, without long-term current use of insulin (H[facility]) Hgb a1c 6.5 this visit. Used to be in pre-diabetes range but up to diabetic range after multiple doses of steroids due to COPD exacerbation. Also used to be on metformin but patient has stopped taking it. She states she feels medication burden due to taking 'so many meds' for her other chronic conditions.  - Will monitor and continue to encourage lifestyle modifications.    Patient discussed with Dr. Evette Doffing   -Gilberto Better, PGY2 [facility] Pager: [phone]

## 2019-06-22 NOTE — Patient Instructions (Addendum)
Thank you for allowing [place] to provide your care today. Today we discussed your hip pain    I have ordered cbc, bmp labs for you. I will call if any are abnormal.    Today we made the following changes to your medications:  - Please take naproxen 550mg  twice daily for 7 days - Please get your X-ray done, I will call you with results - Please remember to perform sinus rinse daily as described below  Please follow-up in 3 months.  Should you have any questions or concerns please call the internal medicine clinic at [phone].       How to Perform a Sinus Rinse A sinus rinse is a home treatment. It rinses your sinuses with a mixture of salt and water (saline solution). Sinuses are air-filled spaces in your skull behind the bones of your face and forehead. They open into your nasal cavity. A sinus rinse can help to clear your nasal cavity. It can clear mucus, dirt, dust, or pollen. You may do a sinus rinse when you have:  A cold.  A virus.  Allergies.  A sinus infection.  A stuffy nose. Talk with your doctor about whether a sinus rinse might help you. What are the risks? A sinus rinse is normally very safe and helpful. However, there are a few risks. These include:  A burning feeling in the sinuses. This may happen if you do not make the saline solution as instructed. Be sure to follow all directions when making the saline solution.  Nasal irritation.  Infection from unclean water. This is rare, but possible. Do not do a sinus rinse if you have had:  Ear or nasal surgery.  An ear infection.  Blocked ears. Supplies needed:  Saline solution or powder.  Distilled or germ-free (sterile) water may be needed to mix with saline powder. ? You may use boiled and cooled tap water. Boil tap water for 5 minutes; cool until it is lukewarm. Use within 24 hours. ? Do not use regular tap water to mix with the saline solution.  Neti pot or nasal rinse bottle. This releases the  saline solution into your nose and through your sinuses. You can buy neti pots and rinse bottles: ? At your local pharmacy. ? At a health food store. ? Online. How to perform a sinus rinse  1. Wash your hands with soap and water. 2. Wash your device using the directions that came with it. 3. Dry your device. 4. Use the solution that comes with your device or one that is sold separately in stores. Follow the mixing directions on the package if you need to mix with sterile or distilled water. 5. Fill your device with the amount of saline solution stated in the device instructions. 6. Stand over a sink and tilt your head sideways over the sink. 7. Place the spout of the device in your upper nostril (the one closer to the ceiling). 8. Gently pour or squeeze the saline solution into your nasal cavity. The liquid should drain to your lower nostril if you are not too stuffed up (congested). 9. While rinsing, breathe through your open mouth. 10. Gently blow your nose to clear any mucus and rinse solution. Blowing too hard may cause ear pain. 11. Repeat in your other nostril. 12. Clean and rinse your device with clean water. 13. Air-dry your device. Talk with your doctor or pharmacist if you have questions about how to do a sinus rinse. Summary  A sinus  rinse is a home treatment. It rinses your sinuses with a mixture of salt and water (saline solution).  A sinus rinse is normally very safe and helpful. Follow all instructions carefully.  Talk with your doctor about whether a sinus rinse might help you. This information is not intended to replace advice given to you by your health care provider. Make sure you discuss any questions you have with your health care provider. Document Released: 05/22/2014 Document Revised: 08/22/2017 Document Reviewed: 08/22/2017 Elsevier Patient Education  2020 [employer].    Hip Pain  The hip is the joint between the upper legs and the lower pelvis. The  bones, cartilage, tendons, and muscles of your hip joint support your body and allow you to move around. Hip pain can range from a minor ache to severe pain in one or both of your hips. The pain may be felt on the inside of the hip joint near the groin, or the outside near the buttocks and upper thigh. You may also have swelling or stiffness. Follow these instructions at home: Managing pain, stiffness, and swelling  If directed, apply ice to the injured area. ? Put ice in a plastic bag. ? Place a towel between your skin and the bag. ? Leave the ice on for 20 minutes, 2-3 times a day  Sleep with a pillow between your legs on your most comfortable side.  Avoid any activities that cause pain. General instructions  Take over-the-counter and prescription medicines only as told by your health care provider.  Do any exercises as told by your health care provider.  Record the following: ? How often you have hip pain. ? The location of your pain. ? What the pain feels like. ? What makes the pain worse.  Keep all follow-up visits as told by your health care provider. This is important. Contact a health care provider if:  You cannot put weight on your leg.  Your pain or swelling continues or gets worse after one week.  It gets harder to walk.  You have a fever. Get help right away if:  You fall.  You have a sudden increase in pain and swelling in your hip.  Your hip is red or swollen or very tender to touch. Summary  Hip pain can range from a minor ache to severe pain in one or both of your hips.  The pain may be felt on the inside of the hip joint near the groin, or the outside near the buttocks and upper thigh.  Avoid any activities that cause pain.  Record how often you have hip pain, the location of the pain, what makes it worse and what it feels like. This information is not intended to replace advice given to you by your health care provider. Make sure you discuss any  questions you have with your health care provider. Document Released: 04/14/2010 Document Revised: 10/07/2017 Document Reviewed: 09/27/2016 Elsevier Patient Education  2020 [employer].

## 2019-06-22 NOTE — Progress Notes (Signed)
Internal Medicine Clinic Attending ° °Case discussed with Dr. Lee at the time of the visit.  We reviewed the resident’s history and exam and pertinent patient test results.  I agree with the assessment, diagnosis, and plan of care documented in the resident’s note.  °

## 2019-06-22 NOTE — Assessment & Plan Note (Signed)
Continues to report sinus pressure and runny nose although somewhat improved compared to prior visit. She mentions that she has not yet had chance to f/u with ENT and does not think this is necessary at this time. She mentions that she remembers being prescribed a prescription spray that has helped her in the past and is requesting additional script. On chart review, no other spray except for Flonase. Pharmacy confirms either Flonase or Nasacort as only prescribed nasal sprays. Discussed in detail regarding importance of sinus rinse. [name] states she is scared this will exacerbate her anxiety but states she will try.  - Instructions on sinus rinse provided

## 2019-06-26 ENCOUNTER — Ambulatory Visit: Payer: Medicare Other | Admitting: Pharmacist

## 2019-06-26 ENCOUNTER — Encounter: Payer: Medicare Other | Admitting: Dietician

## 2019-06-26 ENCOUNTER — Encounter: Payer: Medicare Other | Admitting: Internal Medicine

## 2019-07-04 ENCOUNTER — Other Ambulatory Visit: Payer: Self-pay | Admitting: Pharmacy Technician

## 2019-07-04 NOTE — Patient Outreach (Signed)
[facility]) Care Management  07/04/2019  MELLINA KASSAY 08-05-1951 [id number]    Today was my 4th outreach call to patient.  Successful outreach call placed to patient in regards to BI application for Spiriva and Scientist, clinical (histocompatibility and immunogenetics) for [employer] and Proventil.  Spoke to patient, HIPAA identifiers verified.  Patient still has not mailed back the applications. She informed she had a lot going on. She was unable to provide a timeline of when she may be able to get the applications to me. She inquired about a fax number which was provided to her that the applications could be faxed to.  Will route note to [place] that after 4 call attempts was unable to obtain a date in which the applications would be sent in. Will close patient assistance case for now with option to reopen if the applications are received. Will remove myself from care team.  [name]. Vinette Crites, [place] Management [phone]

## 2019-07-05 ENCOUNTER — Ambulatory Visit: Payer: Self-pay | Admitting: Pharmacist

## 2019-07-07 DIAGNOSIS — Z23 Encounter for immunization: Secondary | ICD-10-CM | POA: Diagnosis not present

## 2019-07-11 ENCOUNTER — Other Ambulatory Visit: Payer: Self-pay | Admitting: Pharmacist

## 2019-07-11 NOTE — Patient Outreach (Signed)
[facility]) Care Management  07/11/2019  [name] 1951-08-19 [id number]   Patient was called to follow up on medication assistance forms. HIPAA identifiers were obtained. Patient confirmed she received the forms Christie Pimple resent. She said she has not been able to complete them due to family obligations but said she will work on them ASAP.  [name] has already taken herself off the care team.  Plan: Await paperwork from patient Call her back in 6-8 weeks. Consider closing case if the forms are not returned.   [name], PharmD, [place] Clinical Pharmacist [phone]

## 2019-08-17 ENCOUNTER — Other Ambulatory Visit: Payer: Self-pay | Admitting: Pharmacist

## 2019-08-17 NOTE — Patient Outreach (Signed)
[facility]) Care Management  08/17/2019  [name] [birth year] [id number]   Patient was called to see if she had an opportunity to complete and return the patient assistance forms that were sent to her earlier this year. HIPAA identifiers were obtained. Patient said she has been having a lot go on in her life and had not had an opportunity to complete the forms.   Patient has been called several times about these forms. She was instructed to reach back out to [place] when/if she felt ready to complete the patient assistance process.  Plan: Since the patient has not completed the process an ample time was allotted with reminders, her pharmacy case will be closed. Will gladly reopen the patient's case upon request.  [name], PharmD, [place] Clinical Pharmacist [phone]

## 2019-10-02 ENCOUNTER — Encounter: Payer: Medicare Other | Admitting: Dietician

## 2019-10-02 ENCOUNTER — Encounter: Payer: Medicare Other | Admitting: Internal Medicine

## 2019-10-03 ENCOUNTER — Other Ambulatory Visit: Payer: Self-pay

## 2019-10-03 ENCOUNTER — Other Ambulatory Visit: Payer: Self-pay | Admitting: Pharmacist

## 2019-10-03 ENCOUNTER — Ambulatory Visit (INDEPENDENT_AMBULATORY_CARE_PROVIDER_SITE_OTHER): Payer: Medicare Other | Admitting: Internal Medicine

## 2019-10-03 VITALS — BP 110/72 | HR 106 | Temp 98.6°F | Ht 66.5 in | Wt 195.4 lb

## 2019-10-03 DIAGNOSIS — K219 Gastro-esophageal reflux disease without esophagitis: Secondary | ICD-10-CM

## 2019-10-03 DIAGNOSIS — M1711 Unilateral primary osteoarthritis, right knee: Secondary | ICD-10-CM

## 2019-10-03 DIAGNOSIS — Z7984 Long term (current) use of oral hypoglycemic drugs: Secondary | ICD-10-CM

## 2019-10-03 DIAGNOSIS — F419 Anxiety disorder, unspecified: Secondary | ICD-10-CM

## 2019-10-03 DIAGNOSIS — Z79899 Other long term (current) drug therapy: Secondary | ICD-10-CM | POA: Diagnosis not present

## 2019-10-03 DIAGNOSIS — G8929 Other chronic pain: Secondary | ICD-10-CM

## 2019-10-03 DIAGNOSIS — Z853 Personal history of malignant neoplasm of breast: Secondary | ICD-10-CM

## 2019-10-03 DIAGNOSIS — E118 Type 2 diabetes mellitus with unspecified complications: Secondary | ICD-10-CM

## 2019-10-03 DIAGNOSIS — M25551 Pain in right hip: Secondary | ICD-10-CM

## 2019-10-03 DIAGNOSIS — J449 Chronic obstructive pulmonary disease, unspecified: Secondary | ICD-10-CM | POA: Diagnosis not present

## 2019-10-03 DIAGNOSIS — Z91120 Patient's intentional underdosing of medication regimen due to financial hardship: Secondary | ICD-10-CM | POA: Diagnosis not present

## 2019-10-03 DIAGNOSIS — Z7951 Long term (current) use of inhaled steroids: Secondary | ICD-10-CM

## 2019-10-03 DIAGNOSIS — Z780 Asymptomatic menopausal state: Secondary | ICD-10-CM

## 2019-10-03 DIAGNOSIS — F17210 Nicotine dependence, cigarettes, uncomplicated: Secondary | ICD-10-CM

## 2019-10-03 DIAGNOSIS — Z720 Tobacco use: Secondary | ICD-10-CM

## 2019-10-03 LAB — POCT GLYCOSYLATED HEMOGLOBIN (HGB A1C): Hemoglobin A1C: 5.8 % — AB (ref 4.0–5.6)

## 2019-10-03 LAB — GLUCOSE, CAPILLARY: Glucose-Capillary: 113 mg/dL — ABNORMAL HIGH (ref 70–99)

## 2019-10-03 MED ORDER — AZITHROMYCIN 250 MG PO TABS: ORAL_TABLET | ORAL | 0 refills | Status: AC

## 2019-10-03 MED ORDER — PREDNISONE 20 MG PO TABS: 40.0000 mg | ORAL_TABLET | Freq: Every day | ORAL | 0 refills | Status: AC

## 2019-10-03 MED ORDER — SPIRIVA RESPIMAT 2.5 MCG/ACT IN AERS: 2.0000 | INHALATION_SPRAY | Freq: Every day | RESPIRATORY_TRACT | 11 refills | Status: DC

## 2019-10-03 NOTE — Patient Instructions (Signed)
Dear [name],  Thank you for allowing [place] to provide your care today. Today we discussed your shortness of breath    I have ordered hemoglobin a1c labs for you. I will call if any are abnormal.    Today we made the following changes to your medications:    Please take your inhalers as prescribed Please take prednisone 40mg  for 5 days  Please follow-up in 3 months.    Should you have any questions or concerns please call the internal medicine clinic at [phone].    Thank you for choosing [facility].   Chronic Obstructive Pulmonary Disease Chronic obstructive pulmonary disease (COPD) is a long-term (chronic) lung problem. When you have COPD, it is hard for air to get in and out of your lungs. Usually the condition gets worse over time, and your lungs will never return to normal. There are things you can do to keep yourself as healthy as possible.  Your doctor may treat your condition with: ? Medicines. ? Oxygen. ? Lung surgery.  Your doctor may also recommend: ? Rehabilitation. This includes steps to make your body work better. It may involve a team of specialists. ? Quitting smoking, if you smoke. ? Exercise and changes to your diet. ? Comfort measures (palliative care). Follow these instructions at home: Medicines  Take over-the-counter and prescription medicines only as told by your doctor.  Talk to your doctor before taking any cough or allergy medicines. You may need to avoid medicines that cause your lungs to be dry. Lifestyle  If you smoke, stop. Smoking makes the problem worse. If you need help quitting, ask your doctor.  Avoid being around things that make your breathing worse. This may include smoke, chemicals, and fumes.  Stay active, but remember to rest as well.  Learn and use tips on how to relax.  Make sure you get enough sleep. Most adults need at least 7 hours of sleep every night.  Eat healthy foods. Eat smaller meals more often. Rest  before meals. Controlled breathing Learn and use tips on how to control your breathing as told by your doctor. Try:  Breathing in (inhaling) through your nose for 1 second. Then, pucker your lips and breath out (exhale) through your lips for 2 seconds.  Putting one hand on your belly (abdomen). Breathe in slowly through your nose for 1 second. Your hand on your belly should move out. Pucker your lips and breathe out slowly through your lips. Your hand on your belly should move in as you breathe out.  Controlled coughing Learn and use controlled coughing to clear mucus from your lungs. Follow these steps: 1. Lean your head a little forward. 2. Breathe in deeply. 3. Try to hold your breath for 3 seconds. 4. Keep your mouth slightly open while coughing 2 times. 5. Spit any mucus out into a tissue. 6. Rest and do the steps again 1 or 2 times as needed. General instructions  Make sure you get all the shots (vaccines) that your doctor recommends. Ask your doctor about a flu shot and a pneumonia shot.  Use oxygen therapy and pulmonary rehabilitation if told by your doctor. If you need home oxygen therapy, ask your doctor if you should buy a tool to measure your oxygen level (oximeter).  Make a COPD action plan with your doctor. This helps you to know what to do if you feel worse than usual.  Manage any other conditions you have as told by your doctor.  Avoid going outside when it is very hot, cold, or humid.  Avoid people who have a sickness you can catch (contagious).  Keep all follow-up visits as told by your doctor. This is important. Contact a doctor if:  You cough up more mucus than usual.  There is a change in the color or thickness of the mucus.  It is harder to breathe than usual.  Your breathing is faster than usual.  You have trouble sleeping.  You need to use your medicines more often than usual.  You have trouble doing your normal activities such as getting dressed  or walking around the house. Get help right away if:  You have shortness of breath while resting.  You have shortness of breath that stops you from: ? Being able to talk. ? Doing normal activities.  Your chest hurts for longer than 5 minutes.  Your skin color is more blue than usual.  Your pulse oximeter shows that you have low oxygen for longer than 5 minutes.  You have a fever.  You feel too tired to breathe normally. Summary  Chronic obstructive pulmonary disease (COPD) is a long-term lung problem.  The way your lungs work will never return to normal. Usually the condition gets worse over time. There are things you can do to keep yourself as healthy as possible.  Take over-the-counter and prescription medicines only as told by your doctor.  If you smoke, stop. Smoking makes the problem worse. This information is not intended to replace advice given to you by your health care provider. Make sure you discuss any questions you have with your health care provider. Document Released: 04/12/2008 Document Revised: 10/07/2017 Document Reviewed: 11/29/2016 Elsevier Patient Education  2020 [employer].

## 2019-10-03 NOTE — Patient Outreach (Signed)
[facility]) Care Management  10/03/2019  [name] Jan 14, 1951 [id number]  Patient was called regarding medication assistance per referral. HIPAA identifiers were obtained. [name] has worked with the patient in the past and  but has not received any of the information back from her as requested. Her provider referred her again today.  Patient said she did not feel well today and wanted to know if her prescriptions had been sent to her pharmacy after her visit today.    Patient's chart was reviewed. Azithromycin and Prednisone were sent to [facility].   Patient requested that we resend paperwork to her.  Patient will be re-sent applications for :   Spiriva, Proventil HFA, Dulera  Since we are at the end of the year, this will most likely be for 2021.  Plan: Follow up with the patient in 1 month.  [name], PharmD, [place] Clinical Pharmacist [phone]

## 2019-10-03 NOTE — Assessment & Plan Note (Addendum)
Acute pain developed 3 months prior, now chronic. Has not followed up her X-ray for her hip pain. Mentions feeling 'overwhelmed with everything.' Continues to endorse pain. Mentions did not bother attempting NSAID therapy as she knows 'it won't work.' Also due for DEXA scan due to age. Recommended to f/u with ortho as she had planned to f/u with Dr.Dumonski for her knee pain as well.  - F/u with Ortho

## 2019-10-03 NOTE — Assessment & Plan Note (Addendum)
Presents w/ continued complaint of right knee pain. Mentions that she has not tried the NSAID therapy recommended at her last visit as she knows 'it won't work.' Mentions that she has had significant benefit in the past with morphine during prior hospitalization and requests that she be prescribed this medications since 'all her friends get them easily.' Discussed lack of follow-up for prior osteoarthritis, including X-ray ordered at last visit and encouraged her to trial first line-therapy first. She mentions that she will follow up with her orthopedist, Dr.Dumonski, who she had followed in the past.  - Instructed on Rest, Ice, Compression, Elevation. - F/u with ortho - Can receive steroid injection in clinic if worsening symptoms

## 2019-10-03 NOTE — Assessment & Plan Note (Signed)
Continues to smoke cigarettes daily. >20 pack year smoking history. Not interested in cessation at this time.

## 2019-10-03 NOTE — Assessment & Plan Note (Signed)
Lab Results  Component Value Date   HGBA1C 5.8 (A) 10/03/2019   Improved from 6.5 last visit. Hx of multiple treatment w/ short term steroids due to COPD exacerbation. Receiving steroid therapy this visit. Will likely develop worsenig diabetic control. Was prescribed metformin but she has not been adherent to home meds.  - C/w monitor

## 2019-10-03 NOTE — Progress Notes (Signed)
CC: COPD  HPI: [name] is a 68 y.o. F w/ PMH of COPD GOLD D, Breast ca s/p lumpectomy, Osteoarthiris, GERD, and anxiety presenting w/ complaint of wheezing and cough. She mentions that she has been having progressively worsening 'feeling bad' and wheezing. She also mentions changing in quality of sputum from white ot yellow. Denies any fevers or chills. Denies any sick contact. She mentions that she has not used her Spiriva for about 2 weeks because she ran out and could not afford her copay for her refill. She also mentions that she had stopped suing her [facility] maintenance inhaler after her last appointment with her pulmonologist because 'it was too much.' She is currently using her nebulizer treatment about 3 times daily. She mentions financial difficulties as primary driver for not suing her maintenance therapy but when asked about her communication with [facility] pharmacist who attempted to assist in medication assistance, she was 'overwhelmed' and did not follow up.  Past Medical History:  Diagnosis Date  . Acute sinusitis 01/09/2019  . Anxiety   . Arthritis    "fingers, knees" (08/16/2018)  . Asthma   . Atopic dermatitis 03/20/2018  . Axillary hidradenitis suppurativa 10/13/2018  . Cancer of right breast ([place]) 1991   s/p lumpectomy, chemotherapy and radiation therapy in 1991. Mammogram in 2007 was normal.  . Constipated    h/o  . COPD (chronic obstructive pulmonary disease) ([place])    History of multiple hospital admissions for exercabation   . COPD exacerbation ([place]) 01/01/2019  . COPD with acute exacerbation ([place]) 01/14/2019  . COPD with exacerbation ([place]) 04/06/2009   Qualifier: Diagnosis of  By: Eyvonne Mechanic MD, Vijay    . Depression   . Diarrhea    h/o  . GERD (gastroesophageal reflux disease)   . Headache    "a few times/month" (08/16/2018  . Heart murmur 10/05/11   "first time I ever heard I had one was today"  . History of breast cancer 12/01/2012   Pt with h/o breast CA s/p  lumpectomy with chemo/radiation in 1991. Pt mammogram 2011 was unremarkable. Had CT chest 10/2012 in ED for SOB and showed spiculated nodule with lymph node. 12/04/12: Birads 2; repeat diagnostic mammogram in 1 year.    . Hyperlipidemia   . Hypertension   . Lower extremity edema 09/21/2018  . Obesity   . Personal history of chemotherapy   . Personal history of radiation therapy   . Pneumonia    "couple times in the last 10-15 yrs" (08/16/2018)  . QT prolongation 08/08/2014  . Seasonal allergies 02/25/2017  . Shortness of breath 10/05/11   "at rest; lying down; w/exertion"  . Sigmoid diverticulitis 80/2008  . Tobacco abuse   . Type 2 diabetes mellitus ([place]) 05/14/2009  . Type II diabetes mellitus ([place])     Review of Systems: Review of Systems  Constitutional: Negative for chills, fever and malaise/fatigue.  Eyes: Negative for blurred vision.  Respiratory: Positive for shortness of breath and wheezing.   Cardiovascular: Negative for chest pain, palpitations and leg swelling.  Gastrointestinal: Negative for constipation, diarrhea, nausea and vomiting.  Musculoskeletal: Positive for joint pain.  All other systems reviewed and are negative.    Physical Exam: Vitals:   10/03/19 0856 10/03/19 1138  BP: 133/78 110/72  Pulse: (!) 106   Temp: 98.6 F (37 C)   TempSrc: Oral   SpO2: 100%   Weight: 195 lb 6.4 oz (88.6 kg)   Height: 5' 6.5" (1.689 m)  Physical Exam  Constitutional: She is oriented to person, place, and time. She appears well-developed and well-nourished. She appears distressed (Tear[place]).  Neck: Normal range of motion. Neck supple.  Cardiovascular: Normal rate, regular rhythm, normal heart sounds and intact distal pulses.  No murmur heard. Respiratory: Effort normal. She has wheezes (Diffuse expiratory wheezing).  Distant breath sounds. No dullness to percussion.  Musculoskeletal: Normal range of motion.        General: Tenderness (R knee with crepitus without  erythema, edema or joint space tenderness to palpation) present.  Lymphadenopathy:    She has no cervical adenopathy.  Neurological: She is alert and oriented to person, place, and time.  Skin: Skin is warm and dry.    Assessment & Plan:   Chronic obstructive pulmonary disease with bronchospasm (HCC) Hx of poorly controlled COPD GOLD D w/ grade 2 obstruction. Complicated by poor adherence to maintenance regimen. Worsening wheezing and cough. Denies any systemic symptoms. Has been without spiriva for 2 weeks. Not taking Dulera because BID dosing is 'too much' per patient. Currently using her rescue inhaler >3 times daily. Previous notes with extensive record of history of non-adherence and lost to follow up despite multiple medication assistance efforts. She will not improve unless she takes active role in her health care such as quitting smoking completely and using her maintenance inhalers as prescribed. When attempting to discuss these findings, she demands that she needs antibiotics and steroids. Discussed in detail that these exacerbations will continue to reoccur unless she takes her home medications as prescribed.  - Z-pack, 5 day course of prednisone 40mg  - C/w Spiriva, Dulera, Duoneb PRN (Sample provided in clinic today) [facility] referral for assistance w/ medication affordability - Recommended to quit smoking  Type 2 diabetes mellitus with complication, without long-term current use of insulin ([place]) Lab Results  Component Value Date   HGBA1C 5.8 (A) 10/03/2019   Improved from 6.5 last visit. Hx of multiple treatment w/ short term steroids due to COPD exacerbation. Receiving steroid therapy this visit. Will likely develop worsenig diabetic control. Was prescribed metformin but she has not been adherent to home meds.  - C/w monitor  Primary osteoarthritis of right knee Presents w/ continued complaint of right knee pain. Mentions that she has not tried the NSAID therapy recommended at  her last visit as she knows 'it won't work.' Mentions that she has had significant benefit in the past with morphine during prior hospitalization and requests that she be prescribed this medications since 'all her friends get them easily.' Discussed lack of follow-up for prior osteoarthritis, including X-ray ordered at last visit and encouraged her to trial first line-therapy first. She mentions that she will follow up with her orthopedist, Dr.Dumonski, who she had followed in the past.  - Instructed on Rest, Ice, Compression, Elevation. - F/u with ortho - Can receive steroid injection in clinic if worsening symptoms  Tobacco abuse Continues to smoke cigarettes daily. >20 pack year smoking history. Not interested in cessation at this time.  Chronic hip pain, right Acute pain developed 3 months prior, now chronic. Has not followed up her X-ray for her hip pain. Mentions feeling 'overwhelmed with everything.' Continues to endorse pain. Mentions did not bother attempting NSAID therapy as she knows 'it won't work.' Also due for DEXA scan due to age. Recommended to f/u with ortho as she had planned to f/u with Dr.Dumonski for her knee pain as well.  - F/u with Ortho    Patient seen with Dr.  Mervin Hack, PGY2 [facility] Internal Medicine Pager: [phone]

## 2019-10-03 NOTE — Assessment & Plan Note (Signed)
>>  ASSESSMENT AND PLAN FOR CHRONIC OBSTRUCTIVE PULMONARY DISEASE WITH BRONCHOSPASM ([place]) WRITTEN ON 10/03/2019  5:13 PM BY LEE, JOSHUA K, MD  Hx of poorly controlled COPD GOLD D w/ grade 2 obstruction. Complicated by poor adherence to maintenance regimen. Worsening wheezing and cough. Denies any systemic symptoms. Has been without spiriva for 2 weeks. Not taking Dulera because BID dosing is 'too much' per patient. Currently using her rescue inhaler >3 times daily. Previous notes with extensive record of history of non-adherence and lost to follow up despite multiple medication assistance efforts. She will not improve unless she takes active role in her health care such as quitting smoking completely and using her maintenance inhalers as prescribed. When attempting to discuss these findings, she demands that she needs antibiotics and steroids. Discussed in detail that these exacerbations will continue to reoccur unless she takes her home medications as prescribed.  - Z-pack, 5 day course of prednisone 40mg  - C/w Spiriva, Dulera, Duoneb PRN (Sample provided in clinic today) [facility] referral for assistance w/ medication affordability - Recommended to quit smoking

## 2019-10-03 NOTE — Assessment & Plan Note (Addendum)
Hx of poorly controlled COPD GOLD D w/ grade 2 obstruction. Complicated by poor adherence to maintenance regimen. Worsening wheezing and cough. Denies any systemic symptoms. Has been without spiriva for 2 weeks. Not taking Dulera because BID dosing is 'too much' per patient. Currently using her rescue inhaler >3 times daily. Previous notes with extensive record of history of non-adherence and lost to follow up despite multiple medication assistance efforts. She will not improve unless she takes active role in her health care such as quitting smoking completely and using her maintenance inhalers as prescribed. When attempting to discuss these findings, she demands that she needs antibiotics and steroids. Discussed in detail that these exacerbations will continue to reoccur unless she takes her home medications as prescribed.  - Z-pack, 5 day course of prednisone 40mg  - C/w Spiriva, Dulera, Duoneb PRN (Sample provided in clinic today) [facility] referral for assistance w/ medication affordability - Recommended to quit smoking

## 2019-10-06 NOTE — Progress Notes (Signed)
Internal Medicine Clinic Attending  I saw and evaluated the patient.  I personally confirmed the key portions of the history and exam documented by Dr. Lee and I reviewed pertinent patient test results.  The assessment, diagnosis, and plan were formulated together and I agree with the documentation in the resident's note.  

## 2019-10-09 ENCOUNTER — Other Ambulatory Visit: Payer: Self-pay | Admitting: Pharmacy Technician

## 2019-10-09 NOTE — Patient Outreach (Signed)
[place]) Care Management  10/09/2019  [name] 1951/03/09 [id number]                                        Medication Assistance Referral  Referral From: [place]  Medication/Company: [name] and Proventil / [employer] Patient application portion:  Education officer, museum portion: Interoffice Mailed to Dr. Asencion Noble (May be able to use the applications received in July) Provider address/fax verified via: Office website  Medication/Company: [employer]he [employer] / [employer] Patient application portion:  Mailed Provider application portion: Faxed  to Dr. Asencion Noble (may be able to use the applications received in July) Provider address/fax verified via: Office website   Note: patient was previously mailed applications on [id number] but patient would never engage after multiple phone call attempts  as to when she would send the applications back to [place].     Follow up:  Will follow up with patient in 5-10 business days to confirm application(s) have been received.  Innocence Schlotzhauer P. Jewett Mcgann, [place] Management [phone]

## 2019-10-10 ENCOUNTER — Other Ambulatory Visit: Payer: Self-pay | Admitting: Pharmacy Technician

## 2019-10-10 NOTE — Patient Outreach (Signed)
[facility] [facility]) Care Management  10/10/2019  [name] 06/04/1951 [id number]   Care coordination email received in response to an email I sent him from [place] who informed that [place] does their own patient assistance for providers in their practice.  Sent an in basket message to CPhT [name] in reference to patient assistance. Claiborne Billings informed that she would reach out to the patient.   [name], CPhT  [name], [name], CPhT        Lindzie Boxx,   Good morning! Sorry, it's been hectic coming back from the holiday! I can contact the pt. Thanks!   [name]   Previous Messages  ----- Message -----  From: Jason Fila, CPhT  Sent: 10/09/2019  9:31 AM EST  To: [name], CPhT  Subject: patient assistance                Good am!   We received a referral for [name] for patient assistance for 2020 for  Spiriva with [employer]  and  [place] and Proventil with [employer]   Provider:Patrick Kingsley   How would you like to proceed? Do you all want to do the patient assistance or would you like for [place] to dot it and send the information over to Dr. Joya Gaskins to sign and return?   Thanks for your time,   [name]. [name], CPhT      Since [facility] will follow patient assistance process, will route note to [place] that patient assistance case is being closed and will remove myself from care team.  [name]. [name], [place] Management [phone]

## 2019-10-15 ENCOUNTER — Other Ambulatory Visit: Payer: Self-pay

## 2019-10-15 ENCOUNTER — Encounter (HOSPITAL_COMMUNITY): Payer: Self-pay

## 2019-10-15 ENCOUNTER — Emergency Department (HOSPITAL_COMMUNITY)
Admission: EM | Admit: 2019-10-15 | Discharge: 2019-10-15 | Disposition: A | Discharge status: Home / Self Care | Payer: Medicare Other | Attending: Emergency Medicine | Admitting: Emergency Medicine

## 2019-10-15 DIAGNOSIS — E785 Hyperlipidemia, unspecified: Secondary | ICD-10-CM | POA: Insufficient documentation

## 2019-10-15 DIAGNOSIS — E876 Hypokalemia: Secondary | ICD-10-CM | POA: Insufficient documentation

## 2019-10-15 DIAGNOSIS — I10 Essential (primary) hypertension: Secondary | ICD-10-CM | POA: Insufficient documentation

## 2019-10-15 DIAGNOSIS — F419 Anxiety disorder, unspecified: Secondary | ICD-10-CM | POA: Insufficient documentation

## 2019-10-15 DIAGNOSIS — J449 Chronic obstructive pulmonary disease, unspecified: Secondary | ICD-10-CM | POA: Insufficient documentation

## 2019-10-15 DIAGNOSIS — Z79899 Other long term (current) drug therapy: Secondary | ICD-10-CM | POA: Diagnosis not present

## 2019-10-15 DIAGNOSIS — R200 Anesthesia of skin: Secondary | ICD-10-CM

## 2019-10-15 DIAGNOSIS — R202 Paresthesia of skin: Secondary | ICD-10-CM | POA: Diagnosis not present

## 2019-10-15 DIAGNOSIS — F17210 Nicotine dependence, cigarettes, uncomplicated: Secondary | ICD-10-CM | POA: Insufficient documentation

## 2019-10-15 LAB — BASIC METABOLIC PANEL WITH GFR
Anion gap: 15 (ref 5–15)
BUN: 18 mg/dL (ref 8–23)
CO2: 25 mmol/L (ref 22–32)
Calcium: 8.5 mg/dL — ABNORMAL LOW (ref 8.9–10.3)
Chloride: 101 mmol/L (ref 98–111)
Creatinine, Ser: 0.81 mg/dL (ref 0.44–1.00)
GFR calc Af Amer: >60 mL/min
GFR calc non Af Amer: >60 mL/min
Glucose, Bld: 86 mg/dL (ref 70–99)
Potassium: 3.3 mmol/L — ABNORMAL LOW (ref 3.5–5.1)
Sodium: 141 mmol/L (ref 135–145)

## 2019-10-15 LAB — CBC WITH DIFFERENTIAL/PLATELET
Abs Immature Granulocytes: 0.02 10*3/uL (ref 0.00–0.07)
Basophils Absolute: 0 10*3/uL (ref 0.0–0.1)
Basophils Relative: 0 %
Eosinophils Absolute: 0 10*3/uL (ref 0.0–0.5)
Eosinophils Relative: 1 %
HCT: 39.5 % (ref 36.0–46.0)
Hemoglobin: 13.1 g/dL (ref 12.0–15.0)
Immature Granulocytes: 0 %
Lymphocytes Relative: 33 %
Lymphs Abs: 2.9 10*3/uL (ref 0.7–4.0)
MCH: 29.4 pg (ref 26.0–34.0)
MCHC: 33.2 g/dL (ref 30.0–36.0)
MCV: 88.8 fL (ref 80.0–100.0)
Monocytes Absolute: 0.5 10*3/uL (ref 0.1–1.0)
Monocytes Relative: 6 %
Neutro Abs: 5.3 10*3/uL (ref 1.7–7.7)
Neutrophils Relative %: 60 %
Platelets: 328 10*3/uL (ref 150–400)
RBC: 4.45 MIL/uL (ref 3.87–5.11)
RDW: 13.2 % (ref 11.5–15.5)
WBC: 8.8 10*3/uL (ref 4.0–10.5)
nRBC: 0 % (ref 0.0–0.2)

## 2019-10-15 LAB — CBG MONITORING, ED: Glucose-Capillary: 94 mg/dL (ref 70–99)

## 2019-10-15 LAB — MAGNESIUM: Magnesium: 1.1 mg/dL — ABNORMAL LOW (ref 1.7–2.4)

## 2019-10-15 MED ORDER — MAGNESIUM SULFATE 2 GM/50ML IV SOLN
2.0000 g | Freq: Once | INTRAVENOUS | Status: AC
  Administered 2019-10-15: 09:00:00 2 g via INTRAVENOUS
  Filled 2019-10-15: qty 50

## 2019-10-15 MED ORDER — POTASSIUM CHLORIDE CRYS ER 20 MEQ PO TBCR
40.0000 meq | EXTENDED_RELEASE_TABLET | Freq: Once | ORAL | Status: AC
  Administered 2019-10-15: 40 meq via ORAL
  Filled 2019-10-15: qty 2

## 2019-10-15 MED ORDER — ALBUTEROL SULFATE HFA 108 (90 BASE) MCG/ACT IN AERS
2.0000 | INHALATION_SPRAY | Freq: Once | RESPIRATORY_TRACT | Status: AC
  Administered 2019-10-15: 10:00:00 2 via RESPIRATORY_TRACT
  Filled 2019-10-15: qty 6.7

## 2019-10-15 MED ORDER — LORAZEPAM 1 MG PO TABS
1.0000 mg | ORAL_TABLET | Freq: Once | ORAL | Status: AC
  Administered 2019-10-15: 1 mg via ORAL
  Filled 2019-10-15: qty 1

## 2019-10-15 NOTE — ED Provider Notes (Signed)
[facility] DEPT Provider Note   CSN: [id number] Arrival date & time: 10/15/19  5498     History   Chief Complaint Chief Complaint  Patient presents with  . Tingling  . Anxiety    HPI [name] is a 68 y.o. female.     HPI   68 year old female with shaking in all extremities and numbness. "It feels like I'm bleeding from my knees down into my legs."  She states that she felt like this when she woke up at approximately 2-3 AM.  She could not go back to sleep because of it.  She states that she went to sleep in her usual state of health. She did have a small amount of brandy last night but does not drink regularly.  Denies any acute pain.  No recent changes in medications.  No fevers or chills.  Occasional cough.  Some mild dyspnea but states that she recently had a COPD exacerbation and her breathing actually feels better today than it has been.  Past Medical History:  Diagnosis Date  . Acute sinusitis 01/09/2019  . Anxiety   . Arthritis    "fingers, knees" (08/16/2018)  . Asthma   . Atopic dermatitis 03/20/2018  . Axillary hidradenitis suppurativa 10/13/2018  . Cancer of right breast ([place]) 1991   s/p lumpectomy, chemotherapy and radiation therapy in 1991. Mammogram in 2007 was normal.  . Constipated    h/o  . COPD (chronic obstructive pulmonary disease) ([place])    History of multiple hospital admissions for exercabation   . COPD exacerbation ([place]) 01/01/2019  . COPD with acute exacerbation ([place]) 01/14/2019  . COPD with exacerbation ([place]) 04/06/2009   Qualifier: Diagnosis of  By: Eyvonne Mechanic MD, Vijay    . Depression   . Diarrhea    h/o  . GERD (gastroesophageal reflux disease)   . Headache    "a few times/month" (08/16/2018  . Heart murmur 10/05/11   "first time I ever heard I had one was today"  . History of breast cancer 12/01/2012   Pt with h/o breast CA s/p lumpectomy with chemo/radiation in 1991. Pt mammogram 2011 was unremarkable. Had  CT chest 10/2012 in ED for SOB and showed spiculated nodule with lymph node. 12/04/12: Birads 2; repeat diagnostic mammogram in 1 year.    . Hyperlipidemia   . Hypertension   . Lower extremity edema 09/21/2018  . Obesity   . Personal history of chemotherapy   . Personal history of radiation therapy   . Pneumonia    "couple times in the last 10-15 yrs" (08/16/2018)  . QT prolongation 08/08/2014  . Seasonal allergies 02/25/2017  . Shortness of breath 10/05/11   "at rest; lying down; w/exertion"  . Sigmoid diverticulitis 80/2008  . Tobacco abuse   . Type 2 diabetes mellitus ([place]) 05/14/2009  . Type II diabetes mellitus [facility])    Patient Active Problem List   Diagnosis Date Noted  . Chronic hip pain, right 06/22/2019  . Diabetic neuropathy ([place]) 05/21/2019  . Type 2 diabetes mellitus with complication, without long-term current use of insulin ([place]) 12/26/2018  . Chronic recurrent sinusitis   . Chronic obstructive pulmonary disease with bronchospasm ([place]) 12/24/2014  . Dyslipidemia associated with type 2 diabetes mellitus ([place]) 06/14/2014  . Primary osteoarthritis of right knee 09/29/2012  . GAD (generalized anxiety disorder) 12/30/2009  . Tobacco abuse 04/06/2009  . Hypertension, benign essential, goal below 140/90 04/06/2009   Past Surgical History:  Procedure Laterality Date  .  ABDOMINAL HYSTERECTOMY    . ANTERIOR CERVICAL DECOMP/DISCECTOMY FUSION  2012   "Dr. Lynann Bologna  put plate in; did something to my vertebrae"  . BACK SURGERY    . BREAST LUMPECTOMY Right 1991  . DOBUTAMINE STRESS ECHO  08/2004   Inferior ischemia, normal LV systolic function, no significant CAD    OB History   No obstetric history on file.     Home Medications    Prior to Admission medications   Medication Sig Start Date End Date Taking? Authorizing Provider  albuterol (PROVENTIL HFA) 108 (90 Base) MCG/ACT inhaler Inhale 1-2 puffs into the lungs every 6 (six) hours as needed for wheezing or shortness of  breath. 05/18/19   Mosetta Anis, MD  amLODipine (NORVASC) 10 MG tablet Take 1 tablet (10 mg total) by mouth daily. 03/14/19   [name], MD  benzonatate (TESSALON) 100 MG capsule Take 1 capsule (100 mg total) by mouth 3 (three) times daily as needed for cough. 03/22/19   [name], [name], MD  Blood Glucose Monitoring Suppl (ONETOUCH VERIO) w/Device KIT Use to check blood sugar once daily. (E11.8) 03/28/19   [name], MD  DULoxetine (CYMBALTA) 60 MG capsule Take 1 capsule (60 mg total) by mouth daily. 03/14/19   [name], MD  fluticasone (FLONASE) 50 MCG/ACT nasal spray Place 1 spray into both nostrils daily as needed for allergies or rhinitis. 03/22/19 03/21/20  [name], [name], MD  gabapentin (NEURONTIN) 300 MG capsule Take 1 capsule (300 mg total) by mouth 3 (three) times daily. 05/21/19   Mitzi Hansen, MD  glucose blood (ONETOUCH VERIO) test strip Use to check blood sugar once daily. (E11.8) 03/28/19   [name], MD  hydrochlorothiazide (HYDRODIURIL) 25 MG tablet Take 1 tablet (25 mg total) by mouth daily. 03/14/19   [name], MD  hydrOXYzine (VISTARIL) 100 MG capsule Take 1 capsule (100 mg total) by mouth 3 (three) times daily as needed for itching. 06/22/19   Mosetta Anis, MD  ipratropium-albuterol (DUONEB) 0.5-2.5 (3) MG/3ML SOLN Take 3 mLs by nebulization every 6 (six) hours as needed. 02/13/19   [name], MD  losartan (COZAAR) 100 MG tablet Take 1 tablet (100 mg total) by mouth daily. 03/14/19   [name], MD  metoprolol tartrate (LOPRESSOR) 25 MG tablet Take 1 tablet (25 mg total) by mouth 2 (two) times daily. 06/15/19   Mosetta Anis, MD  mometasone-formoterol (DULERA) 200-5 MCG/ACT AERO Inhale 1 puff into the lungs 2 (two) times daily. 06/15/19   Forde Dandy, PharmD  OneTouch Delica Lancets 50N MISC Use to check blood sugar once daily (E11.8) 03/28/19   [name], MD  simvastatin (ZOCOR) 40 MG tablet Take 1 tablet (40 mg  total) by mouth daily at 6 PM. 03/14/19   [name], MD  Tiotropium Bromide Monohydrate (SPIRIVA RESPIMAT) 2.5 MCG/ACT AERS Inhale 2 puffs into the lungs daily. 10/03/19   Mosetta Anis, MD   Family History Family History  Problem Relation Age of Onset  . Cancer Mother    Social History Social History   Tobacco Use  . Smoking status: Current Every Day Smoker    Packs/day: 0.30    Years: 45.00    Pack years: 13.50    Types: Cigarettes  . Smokeless tobacco: Never Used  . Tobacco comment: 1/3/pk every 2-3 days   Substance Use Topics  . Alcohol use: Yes    Alcohol/week: 4.0 standard drinks  Types: 4 Cans of beer per week    Comment: 08/16/2018 "weekends only"  . Drug use: No   Allergies   Ace inhibitors and Flonase [fluticasone]  Review of Systems Review of Systems  All systems reviewed and negative, other than as noted in HPI. Physical Exam Updated Vital Signs BP (!) 159/86 (BP Location: Left Arm)   Pulse (!) 120   Temp 98.6 F (37 C) (Oral)   Resp 18   Ht '5\' 6"'  (1.676 m)   Wt 89.8 kg   SpO2 100%   BMI 31.96 kg/m   Physical Exam Vitals signs and nursing note reviewed.  Constitutional:      General: She is not in acute distress.    Appearance: She is well-developed.     Comments: Appears anxious. Speech somewhat stuttering/trembling but clearly understandable  HENT:     Head: Normocephalic and atraumatic.  Eyes:     General:        Right eye: No discharge.        Left eye: No discharge.     Conjunctiva/sclera: Conjunctivae normal.  Neck:     Musculoskeletal: Neck supple.  Cardiovascular:     Rate and Rhythm: Regular rhythm. Tachycardia present.     Heart sounds: Normal heart sounds. No murmur. No friction rub. No gallop.   Pulmonary:     Effort: No respiratory distress.     Comments: Faint end expiratory wheezing Abdominal:     General: There is no distension.     Palpations: Abdomen is soft.     Tenderness: There is no abdominal tenderness.   Musculoskeletal:        General: No tenderness.     Comments: Feet warm. Good DP pulses. LE grossly normal in appearance.   Skin:    General: Skin is warm and dry.  Neurological:     Mental Status: She is alert and oriented to person, place, and time.     Cranial Nerves: No cranial nerve deficit.     Sensory: No sensory deficit.     Motor: No weakness.      ED Treatments / Results  Labs (all labs ordered are listed, but only abnormal results are displayed) Labs Reviewed  BASIC METABOLIC PANEL - Abnormal; Notable for the following components:      Result Value   Potassium 3.3 (*)    Calcium 8.5 (*)    All other components within normal limits  MAGNESIUM - Abnormal; Notable for the following components:   Magnesium 1.1 (*)    All other components within normal limits  CBC WITH DIFFERENTIAL/PLATELET  CBG MONITORING, ED    EKG None  Radiology No results found.  Procedures Procedures (including critical care time)  Medications Ordered in ED Medications - No data to display   Initial Impression / Assessment and Plan / ED Course  I have reviewed the triage vital signs and the nursing notes.  Pertinent labs & imaging results that were available during my care of the patient were reviewed by me and considered in my medical decision making (see chart for details).    68yF with numbness in all extremities and shaking. Anxiety? Does say she feels "scared" but not sure why. Mild tachycardia. Sounds regular on exam. Denies CP. Check EKG. Mild wheezing on lung auscultation but hx of COPD, denies acute dyspnea and O2 sats normal on RA. Will check CBC to make sure she is not significantly anemic although I doubt this. Check electrolytes. Says had a  small amount of brandy last night but denies significant ETOH in general. Probably not ETOH withdrawal. She is agreeable to try some ativan for her symptoms. Reassess.    Symptomatic although improved. Hypo Mg/K. Supplemented.   Final  Clinical Impressions(s) / ED Diagnoses   Final diagnoses:  Numbness and tingling of upper and lower extremities of both sides  Hypomagnesemia  Hypokalemia    ED Discharge Orders    None       Virgel Manifold, MD 10/16/19 [phone]

## 2019-10-15 NOTE — ED Triage Notes (Addendum)
Pt reports that her hands started shaking this morning (around 2a-3a) and her feet and arms are numb. Pt reports that she is very anxious. Denies pain. A&Ox4. Ambulatory with a steady gait. I asked her about her speech because she was stuttering. She states that her current speech is normal, but she is very anxious and that it is causing her to stutter more. No facial droop, confusion, or unilateral weakness noted.

## 2019-10-19 ENCOUNTER — Telehealth: Payer: Self-pay

## 2019-10-19 NOTE — Telephone Encounter (Signed)
Attempted to contact pt twice with no results.  Voicemail box full on 10/11/19 2:30pm and voicemail box also full on 10/19/19 11:07am.

## 2019-10-29 ENCOUNTER — Other Ambulatory Visit: Payer: Self-pay | Admitting: Pharmacist

## 2019-10-29 ENCOUNTER — Ambulatory Visit: Payer: Self-pay | Admitting: Pharmacist

## 2019-10-29 NOTE — Patient Outreach (Signed)
[facility]) Care Management  10/29/2019  TIFFANI CAMPBEL [birth year] [id number]   Patient's case is being closed because she was referred for medication assistance and her provider's office [facility]) completes their own patient assistance applications.  Patient's case will gladly be reopened upon request for medication related issues.  [name], PharmD, [place] Clinical Pharmacist [phone]

## 2019-10-30 NOTE — Addendum Note (Signed)
Addended by: Hulan Fray on: 10/30/2019 12:54 PM   Modules accepted: Orders

## 2019-11-05 ENCOUNTER — Telehealth: Payer: Self-pay | Admitting: Internal Medicine

## 2019-11-05 NOTE — Telephone Encounter (Signed)
RTC, pt c/o of swelling in her feet.  She has not taken her "fluid pills in at least 3 days".  She states she is having problems paying for simvastatin, lorsartan and HCTZ. Pt states all medications are at the pharmacy and ready for pick up.  RN reviewed chart and noted there are several messages from pharmacy at [facility] regarding Medication assistance, but pt did not fill out appropriate forms for assistanc so her case was closed.  Pt states she is aware of this and her husband will go to the pharmacy and pick up her medication. Pt highly encouraged to reach out to [facility] for medication assistance, she verbalized understanding.  Pt also encouraged to take medications as prescribed, drink fluids and elevate feet.  If swelling does not subside or worsens, pt instructed to call back for appointment.  She verbalized understanding. Will forward to pcp for agreement and/or further recommendations. SChaplin, RN,BSN

## 2019-11-05 NOTE — Telephone Encounter (Signed)
Agree with all recommendations. Thank you.

## 2019-11-05 NOTE — Telephone Encounter (Signed)
Pt is requesting a nurse to call back [phone]

## 2019-11-07 ENCOUNTER — Other Ambulatory Visit: Payer: Self-pay | Admitting: *Deleted

## 2019-11-07 DIAGNOSIS — I10 Essential (primary) hypertension: Secondary | ICD-10-CM

## 2019-11-10 MED ORDER — METOPROLOL TARTRATE 25 MG PO TABS: 25.0000 mg | ORAL_TABLET | Freq: Two times a day (BID) | ORAL | 1 refills | Status: DC

## 2019-11-15 ENCOUNTER — Other Ambulatory Visit: Payer: Self-pay | Admitting: *Deleted

## 2019-11-15 MED ORDER — GABAPENTIN 300 MG PO CAPS: 300.0000 mg | ORAL_CAPSULE | Freq: Three times a day (TID) | ORAL | 1 refills | Status: DC

## 2019-11-27 ENCOUNTER — Other Ambulatory Visit: Payer: Self-pay | Admitting: *Deleted

## 2019-11-27 ENCOUNTER — Telehealth: Payer: Self-pay | Admitting: Internal Medicine

## 2019-11-27 ENCOUNTER — Telehealth: Payer: Self-pay | Admitting: *Deleted

## 2019-11-27 DIAGNOSIS — F419 Anxiety disorder, unspecified: Secondary | ICD-10-CM

## 2019-11-27 DIAGNOSIS — F329 Major depressive disorder, single episode, unspecified: Secondary | ICD-10-CM

## 2019-11-27 MED ORDER — HYDROXYZINE PAMOATE 100 MG PO CAPS: 100.0000 mg | ORAL_CAPSULE | Freq: Three times a day (TID) | ORAL | 1 refills | Status: DC | PRN

## 2019-11-27 NOTE — Telephone Encounter (Signed)
Pt calls, anxious, worried. Her spouse was sent for COVID testing today due to work exposure. She is advised to call [facility] for test appt She states she has sinus type congestion, diarrhea She and spouse are given instructions Sleep separately Wash linens on sanitize Do not share food nor drink Sanitize dishes well Drink plenty of liquids Keep hands clean and away from face Keep surfaces clean Do not let family/ friends in house If you must go out avoid crowds, wear mask and gloves BUT DO NOT GO OUT UNLESS EMERGENCY If there is chest pain, shortness of breath or severe h/a call 911 or go to ED, tell staff immediately that [place] is suspected or positive  Open windows a couple of times daily and let fresh air circulate for a few minutes Pt is going to [facility] as soon as appt is given  Do you agree? Anything else you want pt to know?

## 2019-11-27 NOTE — Telephone Encounter (Signed)
Pt calling to report her husband is under [place] quarantine as of today .  Pt reporting she has sinus congestion and diarrhea and would like to know if she needs an appointment.

## 2019-11-27 NOTE — Telephone Encounter (Signed)
Started new encounter

## 2019-11-27 NOTE — Telephone Encounter (Signed)
I agree with everything you said. She needs to get herself tested.

## 2019-11-28 ENCOUNTER — Ambulatory Visit: Payer: Medicare Other | Attending: Internal Medicine

## 2019-11-28 DIAGNOSIS — Z20822 Contact with and (suspected) exposure to covid-19: Secondary | ICD-10-CM | POA: Diagnosis not present

## 2019-11-29 LAB — NOVEL CORONAVIRUS, NAA: SARS-CoV-2, NAA: NOT DETECTED

## 2019-12-18 ENCOUNTER — Telehealth: Payer: Self-pay | Admitting: Internal Medicine

## 2019-12-18 ENCOUNTER — Other Ambulatory Visit: Payer: Self-pay | Admitting: Critical Care Medicine

## 2019-12-18 NOTE — Telephone Encounter (Signed)
RTC, pt c/o of sinus congestion, facial pressure X 2 weeks with yellow, mucus nasal discharge.  Pt states she has felt like she has had a sinus infection coming on for a month, did get Covid tested on 1/20 with negative results.  Pt is requesting appt, but does not want to come in person.  Telehealth appt made for 12/19/19 @ 2:45 in [facility]. SChaplin, RN,BSN

## 2019-12-18 NOTE — Telephone Encounter (Signed)
Thank you :)

## 2019-12-18 NOTE — Telephone Encounter (Signed)
Pls contact pt [phone]

## 2019-12-18 NOTE — Telephone Encounter (Signed)
Patient refill request. Please fill if appropriate

## 2019-12-19 ENCOUNTER — Encounter: Payer: Self-pay | Admitting: Radiation Oncology

## 2019-12-19 ENCOUNTER — Other Ambulatory Visit: Payer: Self-pay

## 2019-12-19 ENCOUNTER — Ambulatory Visit (INDEPENDENT_AMBULATORY_CARE_PROVIDER_SITE_OTHER): Payer: Medicare Other | Admitting: Radiation Oncology

## 2019-12-19 DIAGNOSIS — J449 Chronic obstructive pulmonary disease, unspecified: Secondary | ICD-10-CM

## 2019-12-19 DIAGNOSIS — F17210 Nicotine dependence, cigarettes, uncomplicated: Secondary | ICD-10-CM

## 2019-12-19 MED ORDER — PREDNISONE 20 MG PO TABS: 40.0000 mg | ORAL_TABLET | Freq: Every day | ORAL | 0 refills | Status: AC

## 2019-12-19 MED ORDER — AZITHROMYCIN 250 MG PO TABS: ORAL_TABLET | ORAL | 0 refills | Status: DC

## 2019-12-19 MED ORDER — ALBUTEROL SULFATE HFA 108 (90 BASE) MCG/ACT IN AERS: 1.0000 | INHALATION_SPRAY | Freq: Four times a day (QID) | RESPIRATORY_TRACT | 3 refills | Status: DC | PRN

## 2019-12-19 MED ORDER — SPIRIVA RESPIMAT 2.5 MCG/ACT IN AERS: 2.0000 | INHALATION_SPRAY | Freq: Every day | RESPIRATORY_TRACT | 11 refills | Status: DC

## 2019-12-19 NOTE — Assessment & Plan Note (Addendum)
Patient here for telehealth visit with 3 week hx of sinus congestion and productive cough that has not improved with over the counter medicines. Patient has hx of COPD and has run out of her albuterol and her spiriva. She has her duoneb inhaler. Her sxs improve with her inhalers. She reports rattling in her chest and wheezing. She denies chest pain. She has some shortness of breath during coughing fits. She has not been around anyone sick but her husband also developed URI type sx at the same time. They were both tested for COVID and were negative. Patient denies fevers and chills. She reports she feels similar to prior COPD exacerbations. She says she doesn't always take her inhalers as prescribed as they are quite expensive. We discussed the importance of taking those as prescribed to prevent exacerbations. Patient was instructed that as I was not able to physically examine her it was possible I was missing something but that it seems she has a mild COPD exacerbation from a URI that would benefit from steroids, antibiotics and her home inhalers. She was amenable to this plan.  Plan: -refill albuterol -refill spiriva -prednisone 40 mg 5 days -zpack -return precautions -call if sx worsen or fail to improve

## 2019-12-19 NOTE — Assessment & Plan Note (Signed)
>>  ASSESSMENT AND PLAN FOR CHRONIC OBSTRUCTIVE PULMONARY DISEASE WITH BRONCHOSPASM ([place]) WRITTEN ON 12/19/2019  3:40 PM BY Al Decant, MD  Patient here for telehealth visit with 3 week hx of sinus congestion and productive cough that has not improved with over the counter medicines. Patient has hx of COPD and has run out of her albuterol and her spiriva. She has her duoneb inhaler. Her sxs improve with her inhalers. She reports rattling in her chest and wheezing. She denies chest pain. She has some shortness of breath during coughing fits. She has not been around anyone sick but her husband also developed URI type sx at the same time. They were both tested for COVID and were negative. Patient denies fevers and chills. She reports she feels similar to prior COPD exacerbations. She says she doesn't always take her inhalers as prescribed as they are quite expensive. We discussed the importance of taking those as prescribed to prevent exacerbations. Patient was instructed that as I was not able to physically examine her it was possible I was missing something but that it seems she has a mild COPD exacerbation from a URI that would benefit from steroids, antibiotics and her home inhalers. She was amenable to this plan.  Plan: -refill albuterol -refill spiriva -prednisone 40 mg 5 days -zpack -return precautions -call if sx worsen or fail to improve

## 2019-12-19 NOTE — Progress Notes (Signed)
  [facility] Residency Telephone Encounter Continuity Care Appointment  HPI:   This telephone encounter was created for [name] on 12/19/2019 for the following purpose/cc: congestion.   Past Medical History:  Past Medical History:  Diagnosis Date  . Acute sinusitis 01/09/2019  . Anxiety   . Arthritis    "fingers, knees" (08/16/2018)  . Asthma   . Atopic dermatitis 03/20/2018  . Axillary hidradenitis suppurativa 10/13/2018  . Cancer of right breast ([place]) 1991   s/p lumpectomy, chemotherapy and radiation therapy in 1991. Mammogram in 2007 was normal.  . Constipated    h/o  . COPD (chronic obstructive pulmonary disease) ([place])    History of multiple hospital admissions for exercabation   . COPD exacerbation ([place]) 01/01/2019  . COPD with acute exacerbation ([place]) 01/14/2019  . COPD with exacerbation ([place]) 04/06/2009   Qualifier: Diagnosis of  By: Eyvonne Mechanic MD, Vijay    . Depression   . Diarrhea    h/o  . GERD (gastroesophageal reflux disease)   . Headache    "a few times/month" (08/16/2018  . Heart murmur 10/05/11   "first time I ever heard I had one was today"  . History of breast cancer 12/01/2012   Pt with h/o breast CA s/p lumpectomy with chemo/radiation in 1991. Pt mammogram 2011 was unremarkable. Had CT chest 10/2012 in ED for SOB and showed spiculated nodule with lymph node. 12/04/12: Birads 2; repeat diagnostic mammogram in 1 year.    . Hyperlipidemia   . Hypertension   . Lower extremity edema 09/21/2018  . Obesity   . Personal history of chemotherapy   . Personal history of radiation therapy   . Pneumonia    "couple times in the last 10-15 yrs" (08/16/2018)  . QT prolongation 08/08/2014  . Seasonal allergies 02/25/2017  . Shortness of breath 10/05/11   "at rest; lying down; w/exertion"  . Sigmoid diverticulitis 80/2008  . Tobacco abuse   . Type 2 diabetes mellitus ([place]) 05/14/2009  . Type II diabetes mellitus ([facility])       ROS:  Review of  Systems  Constitutional: Negative for chills and fever.  HENT: Positive for congestion.   Respiratory: Positive for cough, sputum production, shortness of breath (during coughing fits) and wheezing.   Cardiovascular: Negative for chest pain.     Assessment / Plan / Recommendations:   Please see A&P under problem oriented charting for assessment of the patient's acute and chronic medical conditions.   As always, pt is advised that if symptoms worsen or new symptoms arise, they should go to an urgent care facility or to to ER for further evaluation.   Consent and Medical Decision Making:   Patient discussed with Dr. Angelia Mould  This is a telephone encounter between [name] and Al Decant on 12/19/2019 for congestion. The visit was conducted with the patient located at home and Al Decant at [facility]. The patient's identity was confirmed using their DOB and current address. The patient has consented to being evaluated through a telephone encounter and understands the associated risks (an examination cannot be done and the patient may need to come in for an appointment) / benefits (allows the patient to remain at home, decreasing exposure to coronavirus). I personally spent 25 minutes on medical discussion.

## 2019-12-20 NOTE — Progress Notes (Signed)
Internal Medicine Clinic Attending  Case discussed with Dr. Lanierat the time of the visit.  We reviewed the resident's history and exam and pertinent patient test results.  I agree with the assessment, diagnosis, and plan of care documented in the resident's note.   

## 2019-12-31 ENCOUNTER — Other Ambulatory Visit: Payer: Self-pay | Admitting: *Deleted

## 2019-12-31 ENCOUNTER — Other Ambulatory Visit: Payer: Self-pay | Admitting: Critical Care Medicine

## 2019-12-31 DIAGNOSIS — J441 Chronic obstructive pulmonary disease with (acute) exacerbation: Secondary | ICD-10-CM

## 2020-01-01 MED ORDER — IPRATROPIUM-ALBUTEROL 0.5-2.5 (3) MG/3ML IN SOLN: 3.0000 mL | Freq: Four times a day (QID) | RESPIRATORY_TRACT | 3 refills | Status: DC | PRN

## 2020-01-03 ENCOUNTER — Telehealth: Payer: Self-pay | Admitting: *Deleted

## 2020-01-05 DIAGNOSIS — Z23 Encounter for immunization: Secondary | ICD-10-CM | POA: Diagnosis not present

## 2020-02-02 DIAGNOSIS — Z23 Encounter for immunization: Secondary | ICD-10-CM | POA: Diagnosis not present

## 2020-02-09 ENCOUNTER — Other Ambulatory Visit: Payer: Self-pay | Admitting: Critical Care Medicine

## 2020-03-01 DIAGNOSIS — H40033 Anatomical narrow angle, bilateral: Secondary | ICD-10-CM | POA: Diagnosis not present

## 2020-03-01 DIAGNOSIS — E119 Type 2 diabetes mellitus without complications: Secondary | ICD-10-CM | POA: Diagnosis not present

## 2020-03-04 ENCOUNTER — Other Ambulatory Visit: Payer: Self-pay | Admitting: *Deleted

## 2020-03-05 MED ORDER — GABAPENTIN 400 MG PO CAPS: 400.0000 mg | ORAL_CAPSULE | Freq: Three times a day (TID) | ORAL | 1 refills | Status: DC

## 2020-04-22 ENCOUNTER — Encounter: Payer: Self-pay | Admitting: Internal Medicine

## 2020-04-22 ENCOUNTER — Ambulatory Visit (INDEPENDENT_AMBULATORY_CARE_PROVIDER_SITE_OTHER): Payer: Medicare Other | Admitting: Internal Medicine

## 2020-04-22 VITALS — BP 179/75 | HR 100 | Temp 99.2°F | Ht 66.0 in | Wt 181.2 lb

## 2020-04-22 DIAGNOSIS — Z853 Personal history of malignant neoplasm of breast: Secondary | ICD-10-CM

## 2020-04-22 DIAGNOSIS — E039 Hypothyroidism, unspecified: Secondary | ICD-10-CM | POA: Diagnosis not present

## 2020-04-22 DIAGNOSIS — J9801 Acute bronchospasm: Secondary | ICD-10-CM | POA: Diagnosis not present

## 2020-04-22 DIAGNOSIS — E118 Type 2 diabetes mellitus with unspecified complications: Secondary | ICD-10-CM

## 2020-04-22 DIAGNOSIS — I10 Essential (primary) hypertension: Secondary | ICD-10-CM

## 2020-04-22 DIAGNOSIS — J449 Chronic obstructive pulmonary disease, unspecified: Secondary | ICD-10-CM

## 2020-04-22 DIAGNOSIS — F411 Generalized anxiety disorder: Secondary | ICD-10-CM | POA: Diagnosis not present

## 2020-04-22 DIAGNOSIS — F419 Anxiety disorder, unspecified: Secondary | ICD-10-CM

## 2020-04-22 DIAGNOSIS — E1142 Type 2 diabetes mellitus with diabetic polyneuropathy: Secondary | ICD-10-CM | POA: Diagnosis not present

## 2020-04-22 DIAGNOSIS — N63 Unspecified lump in unspecified breast: Secondary | ICD-10-CM | POA: Diagnosis not present

## 2020-04-22 MED ORDER — MIRTAZAPINE 15 MG PO TABS: 15.0000 mg | ORAL_TABLET | Freq: Every day | ORAL | 0 refills | Status: DC

## 2020-04-22 MED ORDER — DULOXETINE HCL 60 MG PO CPEP: 60.0000 mg | ORAL_CAPSULE | Freq: Every day | ORAL | 2 refills | Status: DC

## 2020-04-22 MED ORDER — HYDROCHLOROTHIAZIDE 25 MG PO TABS: 25.0000 mg | ORAL_TABLET | Freq: Every day | ORAL | 3 refills | Status: DC

## 2020-04-22 MED ORDER — LOSARTAN POTASSIUM 100 MG PO TABS: 100.0000 mg | ORAL_TABLET | Freq: Every day | ORAL | 3 refills | Status: DC

## 2020-04-22 MED ORDER — AMLODIPINE BESYLATE 10 MG PO TABS: 10.0000 mg | ORAL_TABLET | Freq: Every day | ORAL | 3 refills | Status: DC

## 2020-04-22 MED ORDER — METOPROLOL TARTRATE 25 MG PO TABS: 25.0000 mg | ORAL_TABLET | Freq: Two times a day (BID) | ORAL | 3 refills | Status: DC

## 2020-04-22 MED ORDER — PREGABALIN 75 MG PO CAPS: 75.0000 mg | ORAL_CAPSULE | Freq: Three times a day (TID) | ORAL | 0 refills | Status: DC

## 2020-04-22 NOTE — Assessment & Plan Note (Signed)
>>  ASSESSMENT AND PLAN FOR GAD (GENERALIZED ANXIETY DISORDER) WRITTEN ON 04/22/2020  7:35 PM BY LEE, Dimple Nanas, MD  [name] was noted to be anxious, tachycardic, tearful, in distress. States that she 'feels like she's having a panic attack' while speaking about her fear of breast cancer re-occurrence. She mentions hiding her finding of new breast nodule from her husband and feeling anxious, scared due for her daughter as well who is currently undergoing chemo. She has hx of GAD at home previously prescribed duloxetine and feels this is exacerbated by these new acute issues. She mentions not duloxetine regularly. She mentions previously taking hydralazine to help 'stabilize her mood' but she requests something different. Advised to restart duloxetine as well as starting mirtazepine for supplemental therapy. [name] expressed understanding.  - C/w duloxetine - Start mirtazapine 15mg  qhs

## 2020-04-22 NOTE — Assessment & Plan Note (Addendum)
[name] complaints of bilateral pins-and-needles pain that is worse at night. Mentions feeling like exacerbation of her chronic neuropathy. Was put on gabapentin at her last visit and mentions having some difficulty with adherence. However, states that even when she is taking it as prescribed, she sees no improvement in her symptoms. Discussed trial of Lyrica to assess for effect. Ms.Fake mentions concerns regarding cost of meds. GoodRx coupon provided with short course of pregabalin

## 2020-04-22 NOTE — Assessment & Plan Note (Addendum)
[name] is a 69 yo F w/ PMH of COPD, Generalized anxiety disorder, Right Breast Ca s/p lumpectomy + chemotherapy + radiation in 90s, T2DM, HTN, HLD and diabetic neuropathy presenting with concern regarding new nodules she found on her breasts. She states that she had insidious onset growths that she noticed recently. She does not know when these occurred but she has had prior lumpectomy and chemo/radiation due to breast cancer found on her R breast. She also has significant family history with her daughter currently undergoing chemotherapy and her mother previous passing away from complications of breast cancer. She mentions that she is very anxious and tearful regarding fear of reoccurrence and mentions that she has had prior difficulty keeping up with screening mammograms. She endorses some weight loss.  A/P Presents w/ complaint of new breast nodules. Non-tender, non-mobile firm masses on exam without axillary lymphadenopathy. They are located at the periphery and does not seem to involve the breast tissue, however her anatomy is deformed due to prior treatment and does have significant personal and family history. Also significant unintentional 15 lb weight loss since last visit is alarming. She is overdue for her mammogram as her last one was in  03/2018. Will get diagnostic mammogram for further work-up.  - Diagnostic mammogram w/ ultrasound

## 2020-04-22 NOTE — Assessment & Plan Note (Addendum)
[name] was noted to be anxious, tachycardic, tearful, in distress. States that she 'feels like she's having a panic attack' while speaking about her fear of breast cancer re-occurrence. She mentions hiding her finding of new breast nodule from her husband and feeling anxious, scared due for her daughter as well who is currently undergoing chemo. She has hx of GAD at home previously prescribed duloxetine and feels this is exacerbated by these new acute issues. She mentions not duloxetine regularly. She mentions previously taking hydralazine to help 'stabilize her mood' but she requests something different. Advised to restart duloxetine as well as starting mirtazepine for supplemental therapy. [name] expressed understanding.  - C/w duloxetine - Start mirtazapine 15mg  qhs

## 2020-04-22 NOTE — Patient Instructions (Addendum)
Thank you for allowing [place] to provide your care today. Today we discussed your foot pain and your breast nodules    I have ordered bmp, hgb a1c, and calcium labs for you. I will call if any are abnormal.    Today we made the following changes to your medications.    Please stop gabapentin Please start pregabalin 50mg  three times daily. You can increase the dose to 100mg  if needed Please take mirtazapine as needed for panic symptoms  Please follow-up in 6 months.    Should you have any questions or concerns please call the internal medicine clinic at [phone].     Mammogram A mammogram is an X-ray of the breasts that is done to check for changes that are not normal. This test can screen for and find any changes that may suggest breast cancer. Mammograms are regularly done on women. A man may have a mammogram if he has a lump or swelling in his breast. This test can also help to find other changes and variations in the breast. Tell a doctor:  About any allergies you have.  If you have breast implants.  If you have had previous breast disease, biopsy, or surgery.  If you are breastfeeding.  If you are younger than age 87.  If you have a family history of breast cancer.  Whether you are pregnant or may be pregnant. What are the risks? Generally, this is a safe procedure. However, problems may occur, including:  Exposure to radiation. Radiation levels are very low with this test.  The results being misinterpreted.  The need for further tests.  The inability of the mammogram to detect certain cancers. What happens before the procedure?  Have this test done about 1-2 weeks after your period. This is usually when your breasts are the least tender.  If you are visiting a new doctor or clinic, send any past mammogram images to your new doctor's office.  Wash your breasts and under your arms the day of the test.  Do not use deodorants, perfumes, lotions, or powders on the  day of the test.  Take off any jewelry from your neck.  Wear clothes that you can change into and out of easily. What happens during the procedure?   You will undress from the waist up. You will put on a gown.  You will stand in front of the X-ray machine.  Each breast will be placed between two plastic or glass plates. The plates will press down on your breast for a few seconds. Try to stay as relaxed as possible. This does not cause any harm to your breasts. Any discomfort you feel will be very brief.  X-rays will be taken from different angles of each breast. The procedure may vary among doctors and hospitals. What happens after the procedure?  The mammogram will be read by a specialist (radiologist).  You may need to do certain parts of the test again. This depends on the quality of the images.  Ask when your test results will be ready. Make sure you get your test results.  You may go back to your normal activities. Summary  A mammogram is a low energy X-ray of the breasts that is done to check for abnormal changes. A man may have this test if he has a lump or swelling in his breast.  Before the procedure, tell your doctor about any breast problems that you have had in the past.  Have this test done about 1-2  weeks after your period.  For the test, each breast will be placed between two plastic or glass plates. The plates will press down on your breast for a few seconds.  The mammogram will be read by a specialist (radiologist). Ask when your test results will be ready. Make sure you get your test results. This information is not intended to replace advice given to you by your health care provider. Make sure you discuss any questions you have with your health care provider. Document Revised: 06/15/2018 Document Reviewed: 06/15/2018 Elsevier Patient Education  [place].

## 2020-04-22 NOTE — Assessment & Plan Note (Signed)
BP Readings from Last 3 Encounters:  04/22/20 (!) 179/75  10/15/19 (!) 169/79  10/03/19 110/72   Blood pressure above goal this visit. [name] has not taken her anti-hypertensive and is acutely distressed this visit. She shows me her empty pill bottles including her anti-hypertensive such as HCTZ and amlodipine. Requesting refills for her meds.  - 1-year refill supply sent for losartan 100mg , HCTZ 25mg , amlodipin 10mg , and metoprolol 25mg  BID - BMP

## 2020-04-22 NOTE — Assessment & Plan Note (Addendum)
Does not take any diabetic medications. Had previously elevated hgb a1c but thought to be in part due to frequent steroid therapy due to COPD exacerbation. Previously prescribed metformin but has not picked up.  - Hgb a1c this visit - Diabetic foot exam performed

## 2020-04-22 NOTE — Progress Notes (Signed)
CC: Breast nodules  HPI: [name] is a 69 y.o. with PMH listed below presenting with complaint of breast nodules. Please see problem based assessment and plan for further details.  Past Medical History:  Diagnosis Date  . Acute sinusitis 01/09/2019  . Anxiety   . Arthritis    "fingers, knees" (08/16/2018)  . Asthma   . Atopic dermatitis 03/20/2018  . Axillary hidradenitis suppurativa 10/13/2018  . Cancer of right breast ([place]) 1991   s/p lumpectomy, chemotherapy and radiation therapy in 1991. Mammogram in 2007 was normal.  . Constipated    h/o  . COPD (chronic obstructive pulmonary disease) ([place])    History of multiple hospital admissions for exercabation   . COPD exacerbation ([place]) 01/01/2019  . COPD with acute exacerbation ([place]) 01/14/2019  . COPD with exacerbation ([place]) 04/06/2009   Qualifier: Diagnosis of  By: Eyvonne Mechanic MD, Vijay    . Depression   . Diarrhea    h/o  . GERD (gastroesophageal reflux disease)   . Headache    "a few times/month" (08/16/2018  . Heart murmur 10/05/11   "first time I ever heard I had one was today"  . History of breast cancer 12/01/2012   Pt with h/o breast CA s/p lumpectomy with chemo/radiation in 1991. Pt mammogram 2011 was unremarkable. Had CT chest 10/2012 in ED for SOB and showed spiculated nodule with lymph node. 12/04/12: Birads 2; repeat diagnostic mammogram in 1 year.    . Hyperlipidemia   . Hypertension   . Lower extremity edema 09/21/2018  . Obesity   . Personal history of chemotherapy   . Personal history of radiation therapy   . Pneumonia    "couple times in the last 10-15 yrs" (08/16/2018)  . QT prolongation 08/08/2014  . Seasonal allergies 02/25/2017  . Shortness of breath 10/05/11   "at rest; lying down; w/exertion"  . Sigmoid diverticulitis 80/2008  . Tobacco abuse   . Type 2 diabetes mellitus ([place]) 05/14/2009  . Type II diabetes mellitus ([place])     Review of Systems: Review of Systems  Constitutional: Positive for  weight loss. Negative for chills, fever and malaise/fatigue.  Eyes: Negative for blurred vision.  Respiratory: Negative for cough and shortness of breath.   Cardiovascular: Negative for chest pain, palpitations and leg swelling.  Gastrointestinal: Negative for constipation, diarrhea, nausea and vomiting.  Genitourinary: Negative for dysuria and urgency.  Musculoskeletal: Negative for joint pain.  Neurological: Positive for tingling.  Psychiatric/Behavioral: Positive for depression. Negative for suicidal ideas. The patient is nervous/anxious.      Physical Exam: Vitals:   04/22/20 1329  BP: (!) 179/75  Pulse: 100  Temp: 99.2 F (37.3 C)  TempSrc: Oral  SpO2: 100%  Weight: 181 lb 3.2 oz (82.2 kg)  Height: 5\' 6"  (1.676 m)    Physical Exam  Constitutional: She is oriented to person, place, and time. She appears distressed (Tearful).  HENT:  Head: Normocephalic and atraumatic.  Mouth/Throat: Mucous membranes are moist. Oropharynx is clear.  Eyes: Conjunctivae are normal.  Cardiovascular: Regular rhythm, normal heart sounds and normal pulses. Tachycardia present.  No murmur heard. Respiratory: Effort normal and breath sounds normal. She has no wheezes. She has no rales.  GI: Soft. Bowel sounds are normal. She exhibits no distension. There is no abdominal tenderness.  Musculoskeletal:        General: No swelling or tenderness. Normal range of motion.     Cervical back: Normal range of motion and neck supple.  Neurological:  She is alert and oriented to person, place, and time.  Lower extremity fine / pinprick sensation intact  Skin: Skin is warm and dry. Lesion (Firm, mobile, non-tender, non-warm nodule on LUQ of R breast, similiar nodule located on LUQ on L breast.) noted.  No axillary lymphadenopathy  Psychiatric:  Tearful, agitated, distressed    Assessment & Plan:   Personal history of breast cancer [name] is a 69 yo F w/ PMH of COPD, Generalized anxiety disorder,  Right Breast Ca s/p lumpectomy + chemotherapy + radiation in 90s, T2DM, HTN, HLD and diabetic neuropathy presenting with concern regarding new nodules she found on her breasts. She states that she had insidious onset growths that she noticed recently. She does not know when these occurred but she has had prior lumpectomy and chemo/radiation due to breast cancer found on her R breast. She also has significant family history with her daughter currently undergoing chemotherapy and her mother previous passing away from complications of breast cancer. She mentions that she is very anxious and tearful regarding fear of reoccurrence and mentions that she has had prior difficulty keeping up with screening mammograms. She endorses some weight loss.  A/P Presents w/ complaint of new breast nodules. Non-tender, non-mobile firm masses on exam without axillary lymphadenopathy. They are located at the periphery and does not seem to involve the breast tissue, however her anatomy is deformed due to prior treatment and does have significant personal and family history. Also significant unintentional 15 lb weight loss since last visit is alarming. She is overdue for her mammogram as her last one was in  03/2018. Will get diagnostic mammogram for further work-up.  - Diagnostic mammogram w/ ultrasound  Breast nodule See history and a/p under hx of breast ca  Diabetic neuropathy ([place]) [name] complaints of bilateral pins-and-needles pain that is worse at night. Mentions feeling like exacerbation of her chronic neuropathy. Was put on gabapentin at her last visit and mentions having some difficulty with adherence. However, states that even when she is taking it as prescribed, she sees no improvement in her symptoms. Discussed trial of Lyrica to assess for effect. [name] mentions concerns regarding cost of meds. GoodRx coupon provided with [name] course of pregabalin  Type 2 diabetes mellitus with complication, without  long-term current use of insulin ([place]) Does not take any diabetic medications. Had previously elevated hgb a1c but thought to be in part due to frequent steroid therapy due to COPD exacerbation. Previously prescribed metformin but has not picked up.  - Hgb a1c this visit - Diabetic foot exam performed  Hypertension, benign essential, goal below 140/90 BP Readings from Last 3 Encounters:  04/22/20 (!) 179/75  10/15/19 (!) 169/79  10/03/19 110/72   Blood pressure above goal this visit. [name] has not taken her anti-hypertensive and is acutely distressed this visit. She shows me her empty pill bottles including her anti-hypertensive such as HCTZ and amlodipine. Requesting refills for her meds.  - 1-year refill supply sent for losartan 100mg , HCTZ 25mg , amlodipin 10mg , and metoprolol 25mg  BID - BMP  Chronic obstructive pulmonary disease with bronchospasm (HCC) Presents for f/u management of COPD GOLD D w/ Grade 2 obstruction. Prescribed triple therapy but not adherent at home. Cites high-copays as primary causes and requests sample from clinic stores if possible. Discussed inability to receive samples unless absolutely necessary and lack of inhaler samples at the clinic. She mentions she just needs her rescue inhalers. Again discussed lack of proventil samples available. Discussed referral to care management  for medication assistance.  - CM referral - C/w albuterol prn + Spiriva/dulera for maintenance  GAD (generalized anxiety disorder) [name] was noted to be anxious, tachycardic, tearful, in distress. States that she 'feels like she's having a panic attack' while speaking about her fear of breast cancer re-occurrence. She mentions hiding her finding of new breast nodule from her husband and feeling anxious, scared due for her daughter as well who is currently undergoing chemo. She has hx of GAD at home previously prescribed duloxetine and feels this is exacerbated by these new acute  issues. She mentions not duloxetine regularly. She mentions previously taking hydralazine to help 'stabilize her mood' but she requests something different. Advised to restart duloxetine as well as starting mirtazepine for supplemental therapy. [name] expressed understanding.  - C/w duloxetine - Start mirtazapine 15mg  qhs    Patient discussed with Dr. Philipp Ovens   -Gilberto Better, PGY2 [facility] Pager: [phone]

## 2020-04-22 NOTE — Assessment & Plan Note (Addendum)
>>  ASSESSMENT AND PLAN FOR BREAST NODULE WRITTEN ON 04/22/2020  7:19 PM BY Aija Scarfo K, MD  See history and a/p under hx of breast ca  >>ASSESSMENT AND PLAN FOR PERSONAL HISTORY OF BREAST CANCER WRITTEN ON 04/22/2020  7:19 PM BY Ashyr Hedgepath, Dimple Nanas, MD  [name] is a 69 yo F w/ PMH of COPD, Generalized anxiety disorder, Right Breast Ca s/p lumpectomy + chemotherapy + radiation in 90s, T2DM, HTN, HLD and diabetic neuropathy presenting with concern regarding new nodules she found on her breasts. She states that she had insidious onset growths that she noticed recently. She does not know when these occurred but she has had prior lumpectomy and chemo/radiation due to breast cancer found on her R breast. She also has significant family history with her daughter currently undergoing chemotherapy and her mother previous passing away from complications of breast cancer. She mentions that she is very anxious and tearful regarding fear of reoccurrence and mentions that she has had prior difficulty keeping up with screening mammograms. She endorses some weight loss.  A/P Presents w/ complaint of new breast nodules. Non-tender, non-mobile firm masses on exam without axillary lymphadenopathy. They are located at the periphery and does not seem to involve the breast tissue, however her anatomy is deformed due to prior treatment and does have significant personal and family history. Also significant unintentional 15 lb weight loss since last visit is alarming. She is overdue for her mammogram as her last one was in  03/2018. Will get diagnostic mammogram for further work-up.  - Diagnostic mammogram w/ ultrasound

## 2020-04-22 NOTE — Assessment & Plan Note (Signed)
>>  ASSESSMENT AND PLAN FOR CHRONIC OBSTRUCTIVE PULMONARY DISEASE WITH BRONCHOSPASM ([place]) WRITTEN ON 04/22/2020  7:37 PM BY LEE, Dimple Nanas, MD  Presents for f/u management of COPD GOLD D w/ Grade 2 obstruction. Prescribed triple therapy but not adherent at home. Cites high-copays as primary causes and requests sample from clinic stores if possible. Discussed inability to receive samples unless absolutely necessary and lack of inhaler samples at the clinic. She mentions she just needs her rescue inhalers. Again discussed lack of proventil samples available. Discussed referral to care management for medication assistance.  - CM referral - C/w albuterol prn + Spiriva/dulera for maintenance

## 2020-04-22 NOTE — Assessment & Plan Note (Addendum)
Presents for f/u management of COPD GOLD D w/ Grade 2 obstruction. Prescribed triple therapy but not adherent at home. Cites high-copays as primary causes and requests sample from clinic stores if possible. Discussed inability to receive samples unless absolutely necessary and lack of inhaler samples at the clinic. She mentions she just needs her rescue inhalers. Again discussed lack of proventil samples available. Discussed referral to care management for medication assistance.  - CM referral - C/w albuterol prn + Spiriva/dulera for maintenance

## 2020-04-23 LAB — BMP8+ANION GAP
Anion Gap: 14 mmol/L (ref 10.0–18.0)
BUN/Creatinine Ratio: 14 (ref 12–28)
BUN: 10 mg/dL (ref 8–27)
CO2: 22 mmol/L (ref 20–29)
Calcium: 9 mg/dL (ref 8.7–10.3)
Chloride: 107 mmol/L — ABNORMAL HIGH (ref 96–106)
Creatinine, Ser: 0.69 mg/dL (ref 0.57–1.00)
GFR calc Af Amer: 103 mL/min/{1.73_m2} (ref >59)
GFR calc non Af Amer: 89 mL/min/{1.73_m2} (ref >59)
Glucose: 102 mg/dL — ABNORMAL HIGH (ref 65–99)
Potassium: 3.7 mmol/L (ref 3.5–5.2)
Sodium: 143 mmol/L (ref 134–144)

## 2020-04-23 LAB — PTH, INTACT AND CALCIUM
Calcium: 8.8 mg/dL (ref 8.7–10.3)
PTH: 32 pg/mL (ref 15–65)

## 2020-04-23 LAB — HEMOGLOBIN A1C
Est. average glucose Bld gHb Est-mCnc: 108 mg/dL
Hgb A1c MFr Bld: 5.4 % (ref 4.8–5.6)

## 2020-04-23 NOTE — Progress Notes (Signed)
Internal Medicine Clinic Attending ° °Case discussed with Dr. Lee at the time of the visit.  We reviewed the resident’s history and exam and pertinent patient test results.  I agree with the assessment, diagnosis, and plan of care documented in the resident’s note.  °

## 2020-04-24 ENCOUNTER — Telehealth: Payer: Self-pay

## 2020-04-24 DIAGNOSIS — J449 Chronic obstructive pulmonary disease, unspecified: Secondary | ICD-10-CM

## 2020-04-24 MED FILL — VENTOLIN HFA 90 MCG INHALER: 108 (90 BAS | 25 days supply | Qty: 18 | Fill #0

## 2020-04-24 NOTE — Telephone Encounter (Signed)
Spoke to pt via phone(11:03am) and pt states the only ins she has is Medicare A & B. She cannot afford her inhalers.  I will transfer her inhalers to [facility] for our $10 pricing.  I will be mailing her applications for patient assistance through the manufacturers for her albuterol and the Spiriva.  The [facility] no longer has a pt assistance program, if appropriate can we have this changed to Advair, Symbicort, or Breo for patient assistance?  Pt is aware and ok with the change.  Also, can you send the script to the [facility]?

## 2020-04-25 ENCOUNTER — Other Ambulatory Visit: Payer: Self-pay | Admitting: Internal Medicine

## 2020-04-25 DIAGNOSIS — N63 Unspecified lump in unspecified breast: Secondary | ICD-10-CM

## 2020-04-25 DIAGNOSIS — Z853 Personal history of malignant neoplasm of breast: Secondary | ICD-10-CM

## 2020-04-29 MED ORDER — FLUTICASONE FUROATE-VILANTEROL 100-25 MCG/INH IN AEPB: 1.0000 | INHALATION_SPRAY | Freq: Every day | RESPIRATORY_TRACT | 2 refills | Status: DC

## 2020-04-29 MED ORDER — SPIRIVA RESPIMAT 2.5 MCG/ACT IN AERS
2.0000 | INHALATION_SPRAY | Freq: Every day | RESPIRATORY_TRACT | 11 refills | Status: DC
  Filled 2021-03-06: qty 4, 30d supply, fill #0

## 2020-04-29 NOTE — Addendum Note (Signed)
Addended by: Mosetta Anis on: 04/29/2020 03:17 PM   Modules accepted: Orders

## 2020-04-29 NOTE — Telephone Encounter (Signed)
Yes we certainly can change it to [facility]. Thank you so much for your assistance. I will send the script to [facility] pharmacy.

## 2020-05-02 ENCOUNTER — Other Ambulatory Visit: Payer: Self-pay

## 2020-05-02 ENCOUNTER — Ambulatory Visit
Admission: RE | Admit: 2020-05-02 | Discharge: 2020-05-02 | Disposition: A | Discharge status: Home / Self Care | Payer: Medicare Other | Source: Ambulatory Visit | Attending: Internal Medicine | Admitting: Internal Medicine

## 2020-05-02 ENCOUNTER — Ambulatory Visit: Payer: Medicare Other

## 2020-05-02 DIAGNOSIS — N63 Unspecified lump in unspecified breast: Secondary | ICD-10-CM

## 2020-05-02 DIAGNOSIS — Z853 Personal history of malignant neoplasm of breast: Secondary | ICD-10-CM

## 2020-05-06 ENCOUNTER — Other Ambulatory Visit: Payer: Self-pay | Admitting: Internal Medicine

## 2020-05-06 ENCOUNTER — Telehealth: Payer: Self-pay | Admitting: Internal Medicine

## 2020-05-06 DIAGNOSIS — E118 Type 2 diabetes mellitus with unspecified complications: Secondary | ICD-10-CM

## 2020-05-06 DIAGNOSIS — E1142 Type 2 diabetes mellitus with diabetic polyneuropathy: Secondary | ICD-10-CM

## 2020-05-06 MED ORDER — PREGABALIN 75 MG PO CAPS: 75.0000 mg | ORAL_CAPSULE | Freq: Three times a day (TID) | ORAL | 0 refills | Status: DC

## 2020-05-06 MED FILL — PREGABALIN 75 MG CAPS: 75 | 30 days supply | Qty: 90 | Fill #0

## 2020-05-06 NOTE — Telephone Encounter (Signed)
Pls contact pt regarding medicine changes/refills [phone]

## 2020-05-06 NOTE — Telephone Encounter (Signed)
Agree with changes. Will update medlist

## 2020-05-06 NOTE — Telephone Encounter (Signed)
Referral has been placed. Thank you.

## 2020-05-06 NOTE — Telephone Encounter (Signed)
rtc to pt, she stated she needed a script sent to [facility] that dr Truman Hayward had sent to [facility], she cannot get to [facility] easily. pregabalin The script is 75mg  take 1 capsule 3x daily written for #60 [facility] is 100+, [facility] is 199.00 Called [facility] with copay from insurance it is 18.00+ some odd cents Gave a verbal to [facility] and changed to #90 Are you agreeable with this? If so please change on medlist to #90

## 2020-05-06 NOTE — Telephone Encounter (Signed)
Pt called in reference to a Podiatry referral.  She states she discussed this at her last visit with you and would like to go to new Podiatrist and needs a referral.

## 2020-05-13 ENCOUNTER — Encounter: Payer: Self-pay | Admitting: *Deleted

## 2020-05-13 ENCOUNTER — Other Ambulatory Visit: Payer: Self-pay | Admitting: Critical Care Medicine

## 2020-05-13 DIAGNOSIS — E119 Type 2 diabetes mellitus without complications: Secondary | ICD-10-CM

## 2020-06-02 ENCOUNTER — Ambulatory Visit (INDEPENDENT_AMBULATORY_CARE_PROVIDER_SITE_OTHER): Payer: Medicare Other | Admitting: Podiatrist

## 2020-06-02 ENCOUNTER — Other Ambulatory Visit: Payer: Self-pay

## 2020-06-02 ENCOUNTER — Encounter: Payer: Self-pay | Admitting: Podiatrist

## 2020-06-02 DIAGNOSIS — E1169 Type 2 diabetes mellitus with other specified complication: Secondary | ICD-10-CM

## 2020-06-02 DIAGNOSIS — E1142 Type 2 diabetes mellitus with diabetic polyneuropathy: Secondary | ICD-10-CM | POA: Diagnosis not present

## 2020-06-02 DIAGNOSIS — Q828 Other specified congenital malformations of skin: Secondary | ICD-10-CM | POA: Diagnosis not present

## 2020-06-02 DIAGNOSIS — B351 Tinea unguium: Secondary | ICD-10-CM

## 2020-06-04 NOTE — Progress Notes (Signed)
  Chief Complaint  Patient presents with  . Diabetes    Diabetic RFC. Pt has been diagnosed with neuropathy. Most recent HgbA1c = 5.4.  . Peripheral Neuropathy    Bilateral plantar forefoot. C/o intermittent  tingling and burning. Pt takes Lyrica TID and BC powders PRN. She has already tried gabapentin.     HPI: Patient is 69 y.o. female who presents today for the concerns as listed above. Patient states she has sporadic tingling and burning but overall she states the painful neuropathy is under control via the Lyrica.     Review of Systems No fevers, chills, nausea, muscle aches, no difficulty breathing, no calf pain, no chest pain or shortness of breath.   Physical Exam  GENERAL APPEARANCE: Alert, conversant. Appropriately groomed. No acute distress.   VASCULAR: Pedal pulses palpable DP and PT bilateral.  Capillary refill time is immediate to all digits,  Proximal to distal cooling it warm to warm.  Digital perfusion adequate.   NEUROLOGIC: sensation is intact epicritically and protectively to 5.07 monofilament at 5/5 sites bilateral.  Light touch is intact bilateral, vibratory sensation intact bilateral, achilles tendon reflex is intact bilateral.   MUSCULOSKELETAL: acceptable muscle strength, tone and stability bilateral.  No gross boney pedal deformities noted.  No pain, crepitus or limitation noted with foot and ankle range of motion bilateral.   DERMATOLOGIC: skin is warm, supple, and dry.  No open lesions noted.  No rash noted. Digital nails are thickened, moderately discolored, brittle, elongated x 10.   Well circumscribed porokeratotic lesion present plantar lateral right heel. Ground glass appearance present with paring.     Assessment     ICD-10-CM   1. Onychomycosis of multiple toenails with type 2 diabetes mellitus and peripheral neuropathy (HCC)  E11.42    B35.1    E11.69   2. Punctate porokeratosis  Q82.8      Plan  Discussed diabetes and neuropathy and  discussed how neuropathy affects the feet.  Also discussed proper care for diabetic feet.  Debridement of digital nails carried out manually and smoothed with a power burr.  Small intractable porokeratotic lesion pared with a 15 blade without iatrogenic incident.  She will be seen in 3 months for follow up or sooner if any problems arise.

## 2020-07-31 ENCOUNTER — Other Ambulatory Visit: Payer: Self-pay

## 2020-07-31 DIAGNOSIS — E1142 Type 2 diabetes mellitus with diabetic polyneuropathy: Secondary | ICD-10-CM

## 2020-07-31 NOTE — Telephone Encounter (Signed)
pregabalin (LYRICA) 75 MG capsule, refill request @  [facility] [phone] [address], [place] [address] [facility] [address] Phone:  [phone]  Fax:  [phone]

## 2020-08-01 MED ORDER — PREGABALIN 75 MG PO CAPS: 75.0000 mg | ORAL_CAPSULE | Freq: Three times a day (TID) | ORAL | 0 refills | Status: DC

## 2020-08-04 ENCOUNTER — Ambulatory Visit (INDEPENDENT_AMBULATORY_CARE_PROVIDER_SITE_OTHER): Payer: Medicare Other | Admitting: Podiatry

## 2020-08-04 ENCOUNTER — Other Ambulatory Visit: Payer: Self-pay

## 2020-08-04 DIAGNOSIS — G629 Polyneuropathy, unspecified: Secondary | ICD-10-CM

## 2020-08-04 DIAGNOSIS — Q828 Other specified congenital malformations of skin: Secondary | ICD-10-CM | POA: Diagnosis not present

## 2020-08-04 MED ORDER — PREGABALIN 75 MG PO CAPS: 75.0000 mg | ORAL_CAPSULE | Freq: Three times a day (TID) | ORAL | 0 refills | Status: DC

## 2020-08-04 MED ORDER — PREGABALIN 100 MG PO CAPS: 100.0000 mg | ORAL_CAPSULE | Freq: Two times a day (BID) | ORAL | 2 refills | Status: DC

## 2020-08-04 NOTE — Addendum Note (Signed)
Addended by: Velora Heckler on: 08/04/2020 09:39 AM   Modules accepted: Orders

## 2020-08-04 NOTE — Progress Notes (Signed)
Subjective:   Patient ID: [name], female   DOB: 69 y.o.   MRN: [id number]   HPI Patient presents stating she is developed a lot of pain on both feet and she states that she has had Lyrica in the past but has not been able to get a refill that she needs and states that she is not sure what might be causing this   ROS      Objective:  Physical Exam  Neurovascular status intact with inflammation of the feet in general but no specific area of pain no identifiable pathology     Assessment:  Probability for neuropathic-like pain idiopathic in nature possibly related to back issues with chronic condition and failure to respond so far conservative     Plan:  H&P reviewed condition and discussed the difficulty of her situation.  Spent a great deal of time and she is quite emotional about this and I tried to explain to her that she could have stenosis in her back and she should see a neurologist and I wrote down the names.  I then went ahead and advised on topical diclofenac and I am placing her back on Lyrica and 100 mg twice daily but she needs to follow-up with her family physician.  I have no guarantees this will solve her problem but I do not see  way of curing what she is experiencing

## 2020-08-27 DIAGNOSIS — Z23 Encounter for immunization: Secondary | ICD-10-CM | POA: Diagnosis not present

## 2020-09-02 ENCOUNTER — Emergency Department (HOSPITAL_COMMUNITY)
Admission: EM | Admit: 2020-09-02 | Discharge: 2020-09-02 | Disposition: A | Discharge status: Home / Self Care | Payer: Medicare Other | Attending: Emergency Medicine | Admitting: Emergency Medicine

## 2020-09-02 ENCOUNTER — Other Ambulatory Visit: Payer: Self-pay

## 2020-09-02 ENCOUNTER — Encounter (HOSPITAL_COMMUNITY): Payer: Self-pay

## 2020-09-02 DIAGNOSIS — M79671 Pain in right foot: Secondary | ICD-10-CM | POA: Diagnosis not present

## 2020-09-02 DIAGNOSIS — F419 Anxiety disorder, unspecified: Secondary | ICD-10-CM | POA: Insufficient documentation

## 2020-09-02 DIAGNOSIS — R059 Cough, unspecified: Secondary | ICD-10-CM | POA: Diagnosis not present

## 2020-09-02 DIAGNOSIS — F17210 Nicotine dependence, cigarettes, uncomplicated: Secondary | ICD-10-CM | POA: Insufficient documentation

## 2020-09-02 DIAGNOSIS — R0602 Shortness of breath: Secondary | ICD-10-CM | POA: Diagnosis not present

## 2020-09-02 DIAGNOSIS — M79673 Pain in unspecified foot: Secondary | ICD-10-CM | POA: Diagnosis present

## 2020-09-02 DIAGNOSIS — R062 Wheezing: Secondary | ICD-10-CM | POA: Diagnosis not present

## 2020-09-02 DIAGNOSIS — E119 Type 2 diabetes mellitus without complications: Secondary | ICD-10-CM | POA: Diagnosis not present

## 2020-09-02 DIAGNOSIS — G6289 Other specified polyneuropathies: Secondary | ICD-10-CM | POA: Diagnosis not present

## 2020-09-02 DIAGNOSIS — Z7984 Long term (current) use of oral hypoglycemic drugs: Secondary | ICD-10-CM | POA: Insufficient documentation

## 2020-09-02 DIAGNOSIS — M79672 Pain in left foot: Secondary | ICD-10-CM | POA: Insufficient documentation

## 2020-09-02 DIAGNOSIS — J449 Chronic obstructive pulmonary disease, unspecified: Secondary | ICD-10-CM | POA: Diagnosis not present

## 2020-09-02 DIAGNOSIS — Z7951 Long term (current) use of inhaled steroids: Secondary | ICD-10-CM | POA: Diagnosis not present

## 2020-09-02 DIAGNOSIS — I10 Essential (primary) hypertension: Secondary | ICD-10-CM | POA: Diagnosis not present

## 2020-09-02 DIAGNOSIS — G629 Polyneuropathy, unspecified: Secondary | ICD-10-CM | POA: Diagnosis not present

## 2020-09-02 DIAGNOSIS — Z79899 Other long term (current) drug therapy: Secondary | ICD-10-CM | POA: Insufficient documentation

## 2020-09-02 MED ORDER — ALBUTEROL SULFATE HFA 108 (90 BASE) MCG/ACT IN AERS
2.0000 | INHALATION_SPRAY | RESPIRATORY_TRACT | Status: DC | PRN
  Administered 2020-09-02: 2 via RESPIRATORY_TRACT
  Filled 2020-09-02: qty 6.7

## 2020-09-02 MED ORDER — KETOROLAC TROMETHAMINE 30 MG/ML IJ SOLN
30.0000 mg | Freq: Once | INTRAMUSCULAR | Status: AC
  Administered 2020-09-02: 30 mg via INTRAMUSCULAR
  Filled 2020-09-02: qty 1

## 2020-09-02 MED ORDER — PREGABALIN 100 MG PO CAPS: 100.0000 mg | ORAL_CAPSULE | Freq: Three times a day (TID) | ORAL | 2 refills | Status: DC

## 2020-09-02 MED ORDER — LORAZEPAM 1 MG PO TABS
1.0000 mg | ORAL_TABLET | Freq: Once | ORAL | Status: AC
  Administered 2020-09-02: 1 mg via ORAL
  Filled 2020-09-02: qty 1

## 2020-09-02 MED ORDER — HYDROXYZINE HCL 25 MG PO TABS: 25.0000 mg | ORAL_TABLET | Freq: Three times a day (TID) | ORAL | 0 refills | Status: DC | PRN

## 2020-09-02 NOTE — ED Provider Notes (Signed)
[facility] DEPT Provider Note   CSN: [id number] Arrival date & time: 09/02/20  0756     History Chief Complaint  Patient presents with  . Foot Pain    [name] is a 69 y.o. female.  HPI Patient presents with acute on chronic foot pain.  Has been told she has diabetic neuropathy potentially in the past.  States she is recently been started on Lyrica.  States there is no relief.  States it feels as if she is stepping on little bumps all the time.  Wakes her up in the morning but hurts throughout the day.  No fevers.  No back pain.  Has been seen by PCP and podiatry for this.  She is on 100 mg of Lyrica twice a day and states there is no relief.  Patient states he also has anxiety and is anxious that she is always going to have to deal with this. Also short of breath.  States he has had a little bit of cough.  States she has a history of asthma and feels as if she is a little short of breath right now.  Also states she is feeling anxious.  No chest pain.  No fevers.  Will occasionally have a cough with no real sputum production.    Past Medical History:  Diagnosis Date  . Acute sinusitis 01/09/2019  . Anxiety   . Arthritis    "fingers, knees" (08/16/2018)  . Asthma   . Atopic dermatitis 03/20/2018  . Axillary hidradenitis suppurativa 10/13/2018  . Cancer of right breast ([place]) 1991   s/p lumpectomy, chemotherapy and radiation therapy in 1991. Mammogram in 2007 was normal.  . Constipated    h/o  . COPD (chronic obstructive pulmonary disease) ([place])    History of multiple hospital admissions for exercabation   . COPD exacerbation ([place]) 01/01/2019  . COPD with acute exacerbation ([place]) 01/14/2019  . COPD with exacerbation ([place]) 04/06/2009   Qualifier: Diagnosis of  By: Eyvonne Mechanic MD, Vijay    . Depression   . Diarrhea    h/o  . GERD (gastroesophageal reflux disease)   . Headache    "a few times/month" (08/16/2018  . Heart murmur 10/05/11   "first  time I ever heard I had one was today"  . History of breast cancer 12/01/2012   Pt with h/o breast CA s/p lumpectomy with chemo/radiation in 1991. Pt mammogram 2011 was unremarkable. Had CT chest 10/2012 in ED for SOB and showed spiculated nodule with lymph node. 12/04/12: Birads 2; repeat diagnostic mammogram in 1 year.    . Hyperlipidemia   . Hypertension   . Lower extremity edema 09/21/2018  . Obesity   . Personal history of chemotherapy   . Personal history of radiation therapy   . Pneumonia    "couple times in the last 10-15 yrs" (08/16/2018)  . QT prolongation 08/08/2014  . Seasonal allergies 02/25/2017  . Shortness of breath 10/05/11   "at rest; lying down; w/exertion"  . Sigmoid diverticulitis 80/2008  . Tobacco abuse   . Type 2 diabetes mellitus ([place]) 05/14/2009  . Type II diabetes mellitus [facility])     Patient Active Problem List   Diagnosis Date Noted  . Breast nodule 04/22/2020  . Chronic hip pain, right 06/22/2019  . Diabetic neuropathy ([place]) 05/21/2019  . Type 2 diabetes mellitus with complication, without long-term current use of insulin ([place]) 12/26/2018  . Chronic recurrent sinusitis   . Chronic obstructive pulmonary disease with bronchospasm (  [place]) 12/24/2014  . Dyslipidemia associated with type 2 diabetes mellitus ([place]) 06/14/2014  . Personal history of breast cancer 12/01/2012  . Primary osteoarthritis of right knee 09/29/2012  . GAD (generalized anxiety disorder) 12/30/2009  . Tobacco abuse 04/06/2009  . Hypertension, benign essential, goal below 140/90 04/06/2009    Past Surgical History:  Procedure Laterality Date  . ABDOMINAL HYSTERECTOMY    . ANTERIOR CERVICAL DECOMP/DISCECTOMY FUSION  2012   "Dr. Lynann Bologna  put plate in; did something to my vertebrae"  . BACK SURGERY    . BREAST LUMPECTOMY Right 1991  . DOBUTAMINE STRESS ECHO  08/2004   Inferior ischemia, normal LV systolic function, no significant CAD     OB History   No obstetric history on file.      Family History  Problem Relation Age of Onset  . Cancer Mother   . Breast cancer Daughter 71    Social History   Tobacco Use  . Smoking status: Current Every Day Smoker    Packs/day: 0.30    Years: 45.00    Pack years: 13.50    Types: Cigarettes  . Smokeless tobacco: Never Used  . Tobacco comment: 1/3/pk every 2-3 days   Vaping Use  . Vaping Use: Never used  Substance Use Topics  . Alcohol use: Yes    Alcohol/week: 4.0 standard drinks    Types: 4 Cans of beer per week    Comment: 08/16/2018 "weekends only"  . Drug use: No    Home Medications Prior to Admission medications   Medication Sig Start Date End Date Taking? Authorizing Provider  albuterol (PROVENTIL HFA) 108 (90 Base) MCG/ACT inhaler Inhale 1-2 puffs into the lungs every 6 (six) hours as needed for wheezing or shortness of breath. 12/19/19   Al Decant, MD  amLODipine (NORVASC) 10 MG tablet Take 1 tablet (10 mg total) by mouth daily. 04/22/20   Mosetta Anis, MD  Blood Glucose Monitoring Suppl [facility] VERIO) w/Device KIT Use to check blood sugar once daily. (E11.8) 03/28/19   [name], MD  DULoxetine (CYMBALTA) 60 MG capsule Take 1 capsule (60 mg total) by mouth daily. 04/22/20   Mosetta Anis, MD  fluticasone furoate-vilanterol (BREO ELLIPTA) 100-25 MCG/INH AEPB Inhale 1 puff into the lungs daily. 04/29/20   Mosetta Anis, MD  glucose blood [facility] VERIO) test strip Use to check blood sugar once daily. (E11.8) 03/28/19   [name], MD  hydrochlorothiazide (HYDRODIURIL) 25 MG tablet Take 1 tablet (25 mg total) by mouth daily. 04/22/20   Mosetta Anis, MD  hydrOXYzine (ATARAX/VISTARIL) 25 MG tablet Take 1 tablet (25 mg total) by mouth every 8 (eight) hours as needed. 09/02/20   Davonna Belling, MD  ipratropium-albuterol (DUONEB) 0.5-2.5 (3) MG/3ML SOLN Take 3 mLs by nebulization every 6 (six) hours as needed. 01/01/20   Mosetta Anis, MD  losartan (COZAAR) 100 MG tablet Take 1 tablet (100 mg  total) by mouth daily. 04/22/20   Mosetta Anis, MD  metFORMIN (GLUCOPHAGE) 1000 MG tablet TAKE 1 TABLET(1000 MG) BY MOUTH TWICE DAILY 12/18/19   Mosetta Anis, MD  metoprolol tartrate (LOPRESSOR) 25 MG tablet Take 1 tablet (25 mg total) by mouth 2 (two) times daily. 04/22/20   Mosetta Anis, MD  mirtazapine (REMERON) 15 MG tablet Take 1 tablet (15 mg total) by mouth at bedtime. 04/22/20   Mosetta Anis, MD  OneTouch Delica Lancets 84X MISC Use to check blood sugar once daily (E11.8) 03/28/19  [name], MD  pregabalin (LYRICA) 100 MG capsule Take 1 capsule (100 mg total) by mouth 3 (three) times daily. 09/02/20   Davonna Belling, MD  simvastatin (ZOCOR) 40 MG tablet TAKE 1 TABLET(40 MG) BY MOUTH DAILY AT 6 PM 05/13/20   [name], MD  Tiotropium Bromide Monohydrate (SPIRIVA RESPIMAT) 2.5 MCG/ACT AERS Inhale 2 puffs into the lungs daily. 04/29/20   Mosetta Anis, MD    Allergies    Ace inhibitors  Review of Systems   Review of Systems  Constitutional: Negative for appetite change and fever.  HENT: Negative for congestion.   Respiratory: Positive for cough, shortness of breath and wheezing.   Cardiovascular: Negative for chest pain.  Gastrointestinal: Negative for abdominal pain.  Endocrine: Negative for polyuria.  Genitourinary: Negative for flank pain.  Musculoskeletal: Negative for back pain.  Skin: Negative for rash and wound.  Neurological: Negative for weakness.       Presents with bilateral foot pain.    Psychiatric/Behavioral: Negative for confusion.    Physical Exam Updated Vital Signs BP (!) 160/73   Pulse 84   Temp 98.7 F (37.1 C) (Oral)   Resp 16   SpO2 99%   Physical Exam Vitals and nursing note reviewed.  HENT:     Head: Atraumatic.  Eyes:     Extraocular Movements: Extraocular movements intact.     Pupils: Pupils are equal, round, and reactive to light.  Cardiovascular:     Rate and Rhythm: Regular rhythm.  Pulmonary:     Breath sounds:  Wheezing present.     Comments: Few wheezes. Abdominal:     Palpations: Abdomen is soft.  Musculoskeletal:     Cervical back: Neck supple.     Comments: Tenderness over palmar aspect bilateral feet without skin changes induration or mass.  Warm feet with pulses intact.  Skin:    General: Skin is warm.     Capillary Refill: Capillary refill takes less than 2 seconds.  Neurological:     Mental Status: She is alert and oriented to person, place, and time.  Psychiatric:     Comments: Patient appears somewhat anxious and is tearful.     ED Results / Procedures / Treatments   Labs (all labs ordered are listed, but only abnormal results are displayed) Labs Reviewed - No data to display  EKG None  Radiology No results found.  Procedures Procedures (including critical care time)  Medications Ordered in ED Medications  ketorolac (TORADOL) 30 MG/ML injection 30 mg (30 mg Intramuscular Given 09/02/20 0848)  LORazepam (ATIVAN) tablet 1 mg (1 mg Oral Given 09/02/20 3086)    ED Course  I have reviewed the triage vital signs and the nursing notes.  Pertinent labs & imaging results that were available during my care of the patient were reviewed by me and considered in my medical decision making (see chart for details).    MDM Rules/Calculators/A&P                          Patient had bilateral foot pain.  Has had for a while.  I think likely neuropathy.  Has seen other providers for.  Pain poorly controlled appears to be an anxiety component and depression component also on top of this.  After long discussion with patient will increase her Lyrica to 3 times a day.  Also will add short course of Vistaril for anxiety.  Will need to follow with PCP  and potentially podiatry.  Also patient is very anxious because she does not know she is still a diabetic.  States she was told that her hemoglobin A1c was fine and they did not know if she was a diabetic anymore.  We will need to follow with  PCP. Final Clinical Impression(s) / ED Diagnoses Final diagnoses:  Other polyneuropathy    Rx / DC Orders ED Discharge Orders         Ordered    pregabalin (LYRICA) 100 MG capsule  3 times daily        09/02/20 1029    hydrOXYzine (ATARAX/VISTARIL) 25 MG tablet  Every 8 hours PRN        09/02/20 1031           Davonna Belling, MD 09/02/20 1542

## 2020-09-02 NOTE — Discharge Instructions (Signed)
Follow-up with your doctor and your podiatrist.  You may need continue modification of the medicines to help with neuropathy and also may need help dealing with the chronic pain.

## 2020-09-02 NOTE — ED Triage Notes (Signed)
Pt reports feet tingling and pain for a few weeks now. Pt reports no diagnosis of neuropathy. Pt states that her doctor told her she is "no longer a diabetic". Pt is laying in bed shaking her feet and appears to be very anxious.

## 2020-09-08 ENCOUNTER — Ambulatory Visit: Payer: Medicare Other | Admitting: Podiatrist

## 2020-10-06 ENCOUNTER — Telehealth: Payer: Self-pay | Admitting: *Deleted

## 2020-10-06 ENCOUNTER — Ambulatory Visit (INDEPENDENT_AMBULATORY_CARE_PROVIDER_SITE_OTHER): Payer: Medicare Other | Admitting: Internal Medicine

## 2020-10-06 ENCOUNTER — Other Ambulatory Visit: Payer: Self-pay

## 2020-10-06 DIAGNOSIS — J441 Chronic obstructive pulmonary disease with (acute) exacerbation: Secondary | ICD-10-CM

## 2020-10-06 DIAGNOSIS — J449 Chronic obstructive pulmonary disease, unspecified: Secondary | ICD-10-CM

## 2020-10-06 MED ORDER — PREDNISONE 20 MG PO TABS: 40.0000 mg | ORAL_TABLET | Freq: Every day | ORAL | 0 refills | Status: AC

## 2020-10-06 MED ORDER — AZITHROMYCIN 250 MG PO TABS: ORAL_TABLET | ORAL | 0 refills | Status: DC

## 2020-10-06 MED ORDER — IPRATROPIUM-ALBUTEROL 0.5-2.5 (3) MG/3ML IN SOLN: 3.0000 mL | Freq: Four times a day (QID) | RESPIRATORY_TRACT | 3 refills | Status: DC | PRN

## 2020-10-06 NOTE — Progress Notes (Signed)
[facility] Internal Medicine Residency Telephone Encounter Continuity Care Appointment  HPI:   This telephone encounter was created for [name] on 10/06/2020 for the following purpose/cc sinus congestion.  Mrs. Hargan states she has been experiencing sinus congestion with rhinorrhea and post-nasal drip for the past several days. She usually knows she's in trouble when she cannot taste or smell. She denies having those symptoms presently, but feels the symptoms looming. She denies any fevers but endorses some chills. She has been using her nebulizer machine and it does help her symptoms. Mrs. Hitchcock is requesting a refill. She is feeling SOB with wheezing but wonders if this is due to her anxiety. Denies any cough.   Denies any sick contacts. She's had her COVID vaccine with booster and influenza shot.    Past Medical History:  Past Medical History:  Diagnosis Date  . Acute sinusitis 01/09/2019  . Anxiety   . Arthritis    "fingers, knees" (08/16/2018)  . Asthma   . Atopic dermatitis 03/20/2018  . Axillary hidradenitis suppurativa 10/13/2018  . Cancer of right breast ([place]) 1991   s/p lumpectomy, chemotherapy and radiation therapy in 1991. Mammogram in 2007 was normal.  . Constipated    h/o  . COPD (chronic obstructive pulmonary disease) ([place])    History of multiple hospital admissions for exercabation   . COPD exacerbation ([place]) 01/01/2019  . COPD with acute exacerbation ([place]) 01/14/2019  . COPD with exacerbation ([place]) 04/06/2009   Qualifier: Diagnosis of  By: Eyvonne Mechanic MD, Vijay    . Depression   . Diarrhea    h/o  . GERD (gastroesophageal reflux disease)   . Headache    "a few times/month" (08/16/2018  . Heart murmur 10/05/11   "first time I ever heard I had one was today"  . History of breast cancer 12/01/2012   Pt with h/o breast CA s/p lumpectomy with chemo/radiation in 1991. Pt mammogram 2011 was unremarkable. Had CT chest 10/2012 in ED for SOB and showed  spiculated nodule with lymph node. 12/04/12: Birads 2; repeat diagnostic mammogram in 1 year.    . Hyperlipidemia   . Hypertension   . Lower extremity edema 09/21/2018  . Obesity   . Personal history of chemotherapy   . Personal history of radiation therapy   . Pneumonia    "couple times in the last 10-15 yrs" (08/16/2018)  . QT prolongation 08/08/2014  . Seasonal allergies 02/25/2017  . Shortness of breath 10/05/11   "at rest; lying down; w/exertion"  . Sigmoid diverticulitis 80/2008  . Tobacco abuse   . Type 2 diabetes mellitus ([place]) 05/14/2009  . Type II diabetes mellitus ([facility])       ROS:   Please see above.    Assessment / Plan / Recommendations:   Please see A&P under problem oriented charting for assessment of the patient's acute and chronic medical conditions.   As always, pt is advised that if symptoms worsen or new symptoms arise, they should go to an urgent care facility or to to ER for further evaluation.   Consent and Medical Decision Making:   Patient discussed with Dr. Evette Doffing  This is a telephone encounter between [name] and Jose Persia on 10/06/2020 for COPD exacerbation. The visit was conducted with the patient located at home and Jose Persia at [facility]. The patient's identity was confirmed using their DOB and current address. The patient has consented to being evaluated through a telephone encounter and understands the associated risks (an  examination cannot be done and the patient may need to come in for an appointment) / benefits (allows the patient to remain at home, decreasing exposure to coronavirus). I personally spent 10 minutes on medical discussion.

## 2020-10-06 NOTE — Telephone Encounter (Signed)
[name], Christie  Deasia Chiu L, RN; [name], Christie; Stenson, Melissa; Gilliam, Pamela; New, Bradley; 1 other ° ° °received, thanks  ° °

## 2020-10-06 NOTE — Assessment & Plan Note (Signed)
>>  ASSESSMENT AND PLAN FOR CHRONIC OBSTRUCTIVE PULMONARY DISEASE WITH BRONCHOSPASM ([place]) WRITTEN ON 10/06/2020  1:57 PM BY Jose Persia, MD  Assessment/Plan:  Ms. Labarbera endorses symptoms of upper respiratory infection today including nasal congestion, rhinorrhea, however she is endorsing mild shortness of breath and wheezing as well concerning for acute exacerbation of COPD.  She is able to speak in full sentences without needing breaks and does not sound short of breath on the phone, however without being able to see her in person to perform a physical exam, evaluation is limited.  Per chart review, patient has approximately 2 exacerbations per year with her last 1 being 7 months ago.  We will go ahead at this time and treat as acute exacerbation of COPD.  I recommended patient be tested for COVID-19 as well although she has her vaccine and booster.  I strongly urged her to call the clinic back or go to the ED should she develop worsening in her shortness of breath.   -Prednisone 40 mg x 5 days -Z-Pak -Duo nebs refilled -Patient requested a new nebulizer as hers is several years old and beginning to malfunction at times.  DME order placed. -Follow-up as needed if symptoms do not resolve or they worsen

## 2020-10-06 NOTE — Assessment & Plan Note (Signed)
Assessment/Plan:  [name] endorses symptoms of upper respiratory infection today including nasal congestion, rhinorrhea, however she is endorsing mild shortness of breath and wheezing as well concerning for acute exacerbation of COPD.  She is able to speak in full sentences without needing breaks and does not sound short of breath on the phone, however without being able to see her in person to perform a physical exam, evaluation is limited.  Per chart review, patient has approximately 2 exacerbations per year with her last 1 being 7 months ago.  We will go ahead at this time and treat as acute exacerbation of COPD.  I recommended patient be tested for COVID-19 as well although she has her vaccine and booster.  I strongly urged her to call the clinic back or go to the ED should she develop worsening in her shortness of breath.   -Prednisone 40 mg x 5 days -Z-Pak -Duo nebs refilled -Patient requested a new nebulizer as hers is several years old and beginning to malfunction at times.  DME order placed. -Follow-up as needed if symptoms do not resolve or they worsen

## 2020-10-06 NOTE — Telephone Encounter (Signed)
CM sent to Skeet Latch at [facility] for order for nebulizer. F2F was today. Hubbard Hartshorn, BSN, RN-BC

## 2020-10-07 NOTE — Progress Notes (Signed)
Internal Medicine Clinic Attending  Case discussed with Dr. Basaraba  At the time of the visit.  We reviewed the resident's history and pertinent patient test results.  I agree with the assessment, diagnosis, and plan of care documented in the resident's note.  

## 2020-10-07 NOTE — Addendum Note (Signed)
Addended by: Lalla Brothers T on: 10/07/2020 08:54 AM   Modules accepted: Level of Service

## 2020-10-24 ENCOUNTER — Encounter: Payer: Self-pay | Admitting: *Deleted

## 2020-10-24 NOTE — Progress Notes (Signed)

## 2020-10-27 NOTE — Progress Notes (Signed)
Things That May Be Affecting Your Health:  Alcohol  Hearing loss  Pain    Depression  Home Safety  Sexual Health   Diabetes  Lack of physical activity  Stress   Difficulty with daily activities  Loneliness  Tiredness   Drug use  Medicines  Tobacco use   Falls  Motor Vehicle Safety  Weight   Food choices  Oral Health  Other    YOUR PERSONALIZED HEALTH PLAN : 1. Schedule your next subsequent Medicare Wellness visit in one year 2. Attend all of your regular appointments to address your medical issues 3. Complete the preventative screenings and services   Annual Wellness Visit   Medicare Covered Preventative Screenings and [place] Men and Women Who How Often Need? Date of Last Service Action  Abdominal Aortic Aneurysm Adults with AAA risk factors Once     Alcohol Misuse and Counseling All Adults Screening once a year if no alcohol misuse. Counseling up to 4 face to face sessions.     Bone Density Measurement  Adults at risk for osteoporosis Once every 2 yrs X    Lipid Panel Z13.6 All adults without CV disease Once every 5 yrs X    Colorectal Cancer   Stool sample or  Colonoscopy All adults 35 and older   Once every year  Every 10 years X    Depression All Adults Once a year  Today   Diabetes Screening Blood glucose, post glucose load, or GTT Z13.1  All adults at risk  Pre-diabetics  Once per year  Twice per year     Diabetes  Self-Management Training All adults Diabetics 10 hrs first year; 2 hours subsequent years. Requires Copay     Glaucoma  Diabetics  Family history of glaucoma  African Americans 62 yrs +  Hispanic Americans 70 yrs + Annually - requires coppay     Hepatitis C Z72.89 or F19.20  High Risk for HCV  Born between 1945 and 1965  Annually  Once     HIV Z11.4 All adults based on risk  Annually btw ages 64 & 61 regardless of risk  Annually > 65 yrs if at increased risk     Lung Cancer Screening Asymptomatic adults aged  15-77 with 30 pack yr history and current smoker OR quit within the last 15 yrs Annually Must have counseling and shared decision making documentation before first screen     Medical Nutrition Therapy Adults with   Diabetes  Renal disease  Kidney transplant within past 3 yrs 3 hours first year; 2 hours subsequent years     Obesity and Counseling All adults Screening once a year Counseling if BMI 30 or higher  Today   Tobacco Use Counseling Adults who use tobacco  Up to 8 visits in one year     Vaccines Z23  Hepatitis B  Influenza   Pneumonia  Adults   Once  Once every flu season  Two different vaccines separated by one year     Next Annual Wellness Visit People with Medicare Every year  Today     Services & Screenings Women Who How Often Need  Date of Last Service Action  Mammogram  Z12.31 Women over 80 One baseline ages 28-39. Annually ager 37 yrs+ X    Pap tests All women Annually if high risk. Every 2 yrs for normal risk women     Screening for cervical cancer with   Pap (Z01.419 nl or Z01.411abnl) &  HPV Z11.51 Women aged 11 to 88 Once every 5 yrs     Screening pelvic and breast exams All women Annually if high risk. Every 2 yrs for normal risk women     Sexually Transmitted Diseases  Chlamydia  Gonorrhea  Syphilis All at risk adults Annually for non pregnant females at increased risk         [place] Men Who How Ofter Need  Date of Last Service Action  Prostate Cancer - DRE & PSA Men over 50 Annually.  DRE might require a copay.     Sexually Transmitted Diseases  Syphilis All at risk adults Annually for men at increased risk

## 2020-11-06 ENCOUNTER — Other Ambulatory Visit: Payer: Self-pay | Admitting: Critical Care Medicine

## 2020-11-06 DIAGNOSIS — E119 Type 2 diabetes mellitus without complications: Secondary | ICD-10-CM

## 2020-12-10 ENCOUNTER — Other Ambulatory Visit: Payer: Self-pay

## 2020-12-10 NOTE — Telephone Encounter (Signed)
hydrOXYzine (ATARAX/VISTARIL) 25 MG tablet, REFILL REQUEST @  [facility] [phone] - [place], [address] [place] [address] AT [place] Phone:  [phone]  Fax:  [phone]

## 2020-12-10 NOTE — Telephone Encounter (Signed)
It seems like this was written out of the ED. I'm going to hold off on a refill until I talk with her tomorrow

## 2020-12-11 ENCOUNTER — Other Ambulatory Visit: Payer: Self-pay

## 2020-12-11 ENCOUNTER — Ambulatory Visit (INDEPENDENT_AMBULATORY_CARE_PROVIDER_SITE_OTHER): Payer: Medicare Other | Admitting: Student

## 2020-12-11 ENCOUNTER — Encounter: Payer: Self-pay | Admitting: Student

## 2020-12-11 VITALS — BP 155/75 | HR 80 | Temp 98.5°F | Ht 66.0 in | Wt 178.3 lb

## 2020-12-11 DIAGNOSIS — F411 Generalized anxiety disorder: Secondary | ICD-10-CM

## 2020-12-11 DIAGNOSIS — E118 Type 2 diabetes mellitus with unspecified complications: Secondary | ICD-10-CM

## 2020-12-11 DIAGNOSIS — I10 Essential (primary) hypertension: Secondary | ICD-10-CM | POA: Diagnosis not present

## 2020-12-11 DIAGNOSIS — J449 Chronic obstructive pulmonary disease, unspecified: Secondary | ICD-10-CM

## 2020-12-11 LAB — POCT GLYCOSYLATED HEMOGLOBIN (HGB A1C): Hemoglobin A1C: 5.7 % — AB (ref 4.0–5.6)

## 2020-12-11 LAB — GLUCOSE, CAPILLARY: Glucose-Capillary: 96 mg/dL (ref 70–99)

## 2020-12-11 MED ORDER — ALBUTEROL SULFATE (2.5 MG/3ML) 0.083% IN NEBU
2.5000 mg | INHALATION_SOLUTION | Freq: Once | RESPIRATORY_TRACT | Status: AC
  Administered 2020-12-11: 2.5 mg via RESPIRATORY_TRACT

## 2020-12-11 MED ORDER — PREDNISONE 20 MG PO TABS: 40.0000 mg | ORAL_TABLET | Freq: Every day | ORAL | 0 refills | Status: AC

## 2020-12-11 MED ORDER — FLUTICASONE FUROATE-VILANTEROL 100-25 MCG/INH IN AEPB: 1.0000 | INHALATION_SPRAY | Freq: Every day | RESPIRATORY_TRACT | 2 refills | Status: DC

## 2020-12-11 MED ORDER — METHYLPREDNISOLONE SODIUM SUCC 125 MG IJ SOLR
125.0000 mg | Freq: Once | INTRAMUSCULAR | Status: AC
  Administered 2020-12-11: 125 mg via INTRAMUSCULAR

## 2020-12-11 MED ORDER — ALBUTEROL SULFATE HFA 108 (90 BASE) MCG/ACT IN AERS: 1.0000 | INHALATION_SPRAY | Freq: Four times a day (QID) | RESPIRATORY_TRACT | 3 refills | Status: DC | PRN

## 2020-12-11 MED ORDER — IPRATROPIUM BROMIDE 0.02 % IN SOLN
0.5000 mg | Freq: Once | RESPIRATORY_TRACT | Status: AC
  Administered 2020-12-11: 0.5 mg via RESPIRATORY_TRACT

## 2020-12-11 NOTE — Patient Instructions (Signed)
Thank you, [name] for allowing [place] to provide your care today. Today we discussed  COPD Exacerbation I am glad our breathing treatment helped you! Please take breathing treatments at home as needed. Take the steroid we are prescribing once a day for 7 days. Use the inhaler we gave you daily.   I will be writing you a prescription for breo and albuterol  Anxiety We will talk tomorrow to talk about your medicines  Foot Pain.   We will talk tomorrow to discuss your medicines  I have ordered the following labs for you:   Lab Orders     Glucose, capillary     POC Hbg A1C    Referrals ordered today:   Referral Orders  No referral(s) requested today     I have ordered the following medication/changed the following medications:   Stop the following medications: There are no discontinued medications.   Start the following medications: Meds ordered this encounter  Medications  . ipratropium (ATROVENT) nebulizer solution 0.5 mg  . albuterol (PROVENTIL) (2.5 MG/3ML) 0.083% nebulizer solution 2.5 mg  . methylPREDNISolone sodium succinate (SOLU-MEDROL) 125 mg/2 mL injection 125 mg     Follow up: 1-2 weeks    Remember: If you feel as though your breathing is worse, go to your closest ED  Should you have any questions or concerns please call the internal medicine clinic at [phone].     Sanjuana Letters, D.O. [place]

## 2020-12-12 MED ORDER — SPIRIVA RESPIMAT 2.5 MCG/ACT IN AERS: 2.0000 | INHALATION_SPRAY | Freq: Every day | RESPIRATORY_TRACT | 0 refills | Status: DC

## 2020-12-12 NOTE — Assessment & Plan Note (Signed)
>>  ASSESSMENT AND PLAN FOR GAD (GENERALIZED ANXIETY DISORDER) WRITTEN ON 12/12/2020  9:04 AM BY Riesa Pope, MD  Assessment: Patient endorses needing a refill on anxiety medicine she was prescribed in the ED. She was given hydroxyzine. Previously she has been prescribed mirtazapine to take at nights to help with sleep as well as duloxetine. Patient is not sure what medications she has or is currently taking. We will be having a phone call today to discuss what she is or is not taking and go from there.   Plan: -plan to continue duloxetine and mirtazapine, phone call to discuss medications today

## 2020-12-12 NOTE — Assessment & Plan Note (Addendum)
Assessment: Patient has Hx of COPD and has been unable to afford her albuterol and breo inhalers ($200+) in the past. At home, she has duonebs for her exacerbations. She was evaluated via  telehealth visit by Dr. Charleen Kirks on 10/06/20 for a COPD exacerbation. She was prescribed 40 mg prednisone for 5 days and written for a z pack  She endorses feeling better for a few days, but this plateaued and she continued to feel poorly. She did not call the clinic because she did not feel as though there was anything that could be done.   She presents today feeling worse, with worsening shortness of breath. She has had some relief with her home duonebs. She continues to smoke half a pack per day. She does not endorse worsening of her cough, but that it has stayed persistent with white phlegm  On physical exam, she is ill appearing, unable to speak in full sentences without being short of breath. She is not tripoding. She has decreased breath sounds bilaterally with diffuse wheezing. She was given 125 mg solumedrol and a duoneb treatment. After, she had significant improvement of her wheezing. She was no longer short of breath with sentences.   She was given sprivia 2.5 mcg sample inhaler as well as 4 albuterol samples for her duoneb machine. She also displayed to [place] her Medicare D card, which she states her pharmacy does not have. I believe this will help with her high inhaler cost. Will call her 12/12/20 to discuss what medications she was able to get from the pharmacy to try and get her on an optimal COPD plan.     Plan: -7 days prednisone 40 mg daily -Spirva 2.5, 2 puffs daily -check on breo/albuterol inhaler status -duonebs as needed daily -follow up appointment in 2 weeks

## 2020-12-12 NOTE — Progress Notes (Signed)
CC: Shortness of Breath  HPI:  [name] is a 70 y.o. female with a past medical history stated below and presents today for shortness of breath. Please see problem based assessment and plan for additional details.  Past Medical History:  Diagnosis Date  . Acute sinusitis 01/09/2019  . Anxiety   . Arthritis    "fingers, knees" (08/16/2018)  . Asthma   . Atopic dermatitis 03/20/2018  . Axillary hidradenitis suppurativa 10/13/2018  . Cancer of right breast ([place]) 1991   s/p lumpectomy, chemotherapy and radiation therapy in 1991. Mammogram in 2007 was normal.  . Constipated    h/o  . COPD (chronic obstructive pulmonary disease) ([place])    History of multiple hospital admissions for exercabation   . COPD exacerbation ([place]) 01/01/2019  . COPD with acute exacerbation ([place]) 01/14/2019  . COPD with exacerbation ([place]) 04/06/2009   Qualifier: Diagnosis of  By: Eyvonne Mechanic MD, Vijay    . Depression   . Diarrhea    h/o  . GERD (gastroesophageal reflux disease)   . Headache    "a few times/month" (08/16/2018  . Heart murmur 10/05/11   "first time I ever heard I had one was today"  . History of breast cancer 12/01/2012   Pt with h/o breast CA s/p lumpectomy with chemo/radiation in 1991. Pt mammogram 2011 was unremarkable. Had CT chest 10/2012 in ED for SOB and showed spiculated nodule with lymph node. 12/04/12: Birads 2; repeat diagnostic mammogram in 1 year.    . Hyperlipidemia   . Hypertension   . Lower extremity edema 09/21/2018  . Obesity   . Personal history of chemotherapy   . Personal history of radiation therapy   . Pneumonia    "couple times in the last 10-15 yrs" (08/16/2018)  . QT prolongation 08/08/2014  . Seasonal allergies 02/25/2017  . Shortness of breath 10/05/11   "at rest; lying down; w/exertion"  . Sigmoid diverticulitis 80/2008  . Tobacco abuse   . Type 2 diabetes mellitus ([place]) 05/14/2009  . Type II diabetes mellitus ([place])     Current Outpatient Medications on File  Prior to Visit  Medication Sig Dispense Refill  . amLODipine (NORVASC) 10 MG tablet Take 1 tablet (10 mg total) by mouth daily. 90 tablet 3  . azithromycin (ZITHROMAX) 250 MG tablet Take as instructed on package. 6 each 0  . Blood Glucose Monitoring Suppl (ONETOUCH VERIO) w/Device KIT Use to check blood sugar once daily. (E11.8) 1 kit 0  . DULoxetine (CYMBALTA) 60 MG capsule Take 1 capsule (60 mg total) by mouth daily. 30 capsule 2  . glucose blood (ONETOUCH VERIO) test strip Use to check blood sugar once daily. (E11.8) 100 each 11  . hydrochlorothiazide (HYDRODIURIL) 25 MG tablet Take 1 tablet (25 mg total) by mouth daily. 90 tablet 3  . hydrOXYzine (ATARAX/VISTARIL) 25 MG tablet Take 1 tablet (25 mg total) by mouth every 8 (eight) hours as needed. 12 tablet 0  . ipratropium-albuterol (DUONEB) 0.5-2.5 (3) MG/3ML SOLN Take 3 mLs by nebulization every 6 (six) hours as needed. 360 mL 3  . losartan (COZAAR) 100 MG tablet Take 1 tablet (100 mg total) by mouth daily. 90 tablet 3  . metFORMIN (GLUCOPHAGE) 1000 MG tablet TAKE 1 TABLET(1000 MG) BY MOUTH TWICE DAILY 180 tablet 3  . metoprolol tartrate (LOPRESSOR) 25 MG tablet Take 1 tablet (25 mg total) by mouth 2 (two) times daily. 180 tablet 3  . mirtazapine (REMERON) 15 MG tablet Take 1 tablet (15  mg total) by mouth at bedtime. 30 tablet 0  . OneTouch Delica Lancets 88T MISC Use to check blood sugar once daily (E11.8) 100 each 11  . pregabalin (LYRICA) 100 MG capsule Take 1 capsule (100 mg total) by mouth 3 (three) times daily. 60 capsule 2  . simvastatin (ZOCOR) 40 MG tablet TAKE 1 TABLET(40 MG) BY MOUTH DAILY AT 6 PM 90 tablet 1  . Tiotropium Bromide Monohydrate (SPIRIVA RESPIMAT) 2.5 MCG/ACT AERS Inhale 2 puffs into the lungs daily. 4 g 11   No current facility-administered medications on file prior to visit.    Family History  Problem Relation Age of Onset  . Cancer Mother   . Breast cancer Daughter 72    Social History   Socioeconomic  History  . Marital status: Married    Spouse name: Not on file  . Number of children: Not on file  . Years of education: 87  . Highest education level: Not on file  Occupational History    Employer: UNEMPLOYED  Tobacco Use  . Smoking status: Current Every Day Smoker    Packs/day: 0.50    Years: 45.00    Pack years: 22.50    Types: Cigarettes  . Smokeless tobacco: Never Used  . Tobacco comment: 1/3/pk every 2-3 days   Vaping Use  . Vaping Use: Never used  Substance and Sexual Activity  . Alcohol use: Yes    Alcohol/week: 4.0 standard drinks    Types: 4 Cans of beer per week    Comment: 08/16/2018 "weekends only"  . Drug use: No  . Sexual activity: Yes  Other Topics Concern  . Not on file  Social History Narrative   Lived in [place] with her husband.   Takes care of 3 grand children.   Trying to find a job, has financial difficulties.      Social Determinants of Health   Financial Resource Strain: Not on file  Food Insecurity: Not on file  Transportation Needs: Not on file  Physical Activity: Not on file  Stress: Not on file  Social Connections: Not on file  Intimate Partner Violence: Not on file    Review of Systems: ROS negative except for what is noted on the assessment and plan.  Vitals:   12/11/20 1403  BP: (!) 155/75  Pulse: 80  Temp: 98.5 F (36.9 C)  TempSrc: Oral  SpO2: 100%  Weight: 178 lb 4.8 oz (80.9 kg)  Height: 5' 6" (1.676 m)    Physical Exam: Constitutional: ill appearing, unable to speak in full sentences without being short of breath HENT: normocephalic atraumatic Eyes: conjunctiva non-erythematous Neck: supple Cardiovascular: regular rate and rhythm Pulmonary/Chest: No tripoding, decreased breath sounds bases bilaterally. Diffuse wheezing.  Abdominal: non-distended MSK: normal bulk and tone Neurological: alert & oriented x 3 Skin: warm and dry Psych: Tearful at times  Assessment & Plan:   See Encounters Tab for problem based  charting.  Patient seen with Dr. Newell Coral, D.O. [facility], PGY-1 Pager: [phone], Phone: [phone] Date 12/12/2020 Time 8:29 AM

## 2020-12-12 NOTE — Assessment & Plan Note (Signed)
Assessment: Patient endorses needing a refill on anxiety medicine she was prescribed in the ED. She was given hydroxyzine. Previously she has been prescribed mirtazapine to take at nights to help with sleep as well as duloxetine. Patient is not sure what medications she has or is currently taking. We will be having a phone call today to discuss what she is or is not taking and go from there.   Plan: -plan to continue duloxetine and mirtazapine, phone call to discuss medications today

## 2020-12-12 NOTE — Assessment & Plan Note (Addendum)
Lab Results  Component Value Date   HGBA1C 5.7 (A) 12/11/2020   Assessment: Patient tolerating metformin well, 1000 mg BID. A1c at goal. Will continue on metformin. If continues to have improvement of A1c, will consider decreasing metformin dose. Unsure if patient is taking, we will review medications today. If she has not been taking metformin and has an A1c of 5.7. will not continue on therapy   Plan: -continue metformin 1000 mg bid -A1c in 4-6 months

## 2020-12-12 NOTE — Assessment & Plan Note (Signed)
BP Readings from Last 3 Encounters:  12/11/20 (!) 155/75  09/02/20 (!) 160/73  04/22/20 (!) 179/75    Assessment: Blood pressure is above goal this visit. She is unsure of what medications she is taking and unfortunately did not bring her medications during this visit. I believe her COPD exacerbation is also causing her some distress contributing to her HTN this visit.   Will attempt to get COPD exacerbation under control during this visit and we will have a phone call on 12/12/20 to further discuss her medications.    Plan: - phone call today to determine what blood pressure medications she is taking. Will adjust medications as needed

## 2020-12-12 NOTE — Assessment & Plan Note (Signed)
>>  ASSESSMENT AND PLAN FOR CHRONIC OBSTRUCTIVE PULMONARY DISEASE WITH BRONCHOSPASM ([place]) WRITTEN ON 12/12/2020  9:02 AM BY Riesa Pope, MD  Assessment: Patient has Hx of COPD and has been unable to afford her albuterol and breo inhalers ($200+) in the past. At home, she has duonebs for her exacerbations. She was evaluated via  telehealth visit by Dr. Charleen Kirks on 10/06/20 for a COPD exacerbation. She was prescribed 40 mg prednisone for 5 days and written for a z pack  She endorses feeling better for a few days, but this plateaued and she continued to feel poorly. She did not call the clinic because she did not feel as though there was anything that could be done.   She presents today feeling worse, with worsening shortness of breath. She has had some relief with her home duonebs. She continues to smoke half a pack per day. She does not endorse worsening of her cough, but that it has stayed persistent with white phlegm  On physical exam, she is ill appearing, unable to speak in full sentences without being short of breath. She is not tripoding. She has decreased breath sounds bilaterally with diffuse wheezing. She was given 125 mg solumedrol and a duoneb treatment. After, she had significant improvement of her wheezing. She was no longer short of breath with sentences.   She was given sprivia 2.5 mcg sample inhaler as well as 4 albuterol samples for her duoneb machine. She also displayed to [place] her Medicare D card, which she states her pharmacy does not have. I believe this will help with her high inhaler cost. Will call her 12/12/20 to discuss what medications she was able to get from the pharmacy to try and get her on an optimal COPD plan.     Plan: -7 days prednisone 40 mg daily -Spirva 2.5, 2 puffs daily -check on breo/albuterol inhaler status -duonebs as needed daily -follow up appointment in 2 weeks

## 2020-12-16 NOTE — Progress Notes (Signed)
Internal Medicine Clinic Attending  I saw and evaluated the patient before and after her nebulized bronchodilator treatment.  I personally confirmed the key portions of the history and exam documented by Dr. Johnney Ou and I reviewed pertinent patient test results.  The assessment, diagnosis, and plan were formulated together and I agree with the documentation in the resident's note.  [name] indeed had significant improvement following in-clinic treatment today.  The challenge is how to manage her illness in the long term, given financial barriers.  The medicines given today were the only ones available as samples; other options may be more appropriate for long term use and accessibility.

## 2020-12-17 ENCOUNTER — Telehealth: Payer: Self-pay

## 2020-12-17 ENCOUNTER — Other Ambulatory Visit: Payer: Self-pay | Admitting: *Deleted

## 2020-12-17 DIAGNOSIS — F419 Anxiety disorder, unspecified: Secondary | ICD-10-CM

## 2020-12-17 DIAGNOSIS — F411 Generalized anxiety disorder: Secondary | ICD-10-CM

## 2020-12-17 MED ORDER — DULOXETINE HCL 60 MG PO CPEP: 60.0000 mg | ORAL_CAPSULE | Freq: Every day | ORAL | 2 refills | Status: DC

## 2020-12-17 MED ORDER — MIRTAZAPINE 15 MG PO TABS: 15.0000 mg | ORAL_TABLET | Freq: Every day | ORAL | 0 refills | Status: DC

## 2020-12-17 NOTE — Telephone Encounter (Signed)
ERROR

## 2020-12-17 NOTE — Telephone Encounter (Signed)
Patient called in requesting refills on duloxetine and mirtazapine to [facility] on [address]. This was discussed with Dr. Johnney Ou at [facility] on 12/11/2020. Hubbard Hartshorn, BSN, RN-BC

## 2021-02-12 ENCOUNTER — Other Ambulatory Visit: Payer: Self-pay | Admitting: *Deleted

## 2021-02-12 DIAGNOSIS — F411 Generalized anxiety disorder: Secondary | ICD-10-CM

## 2021-02-13 MED ORDER — MIRTAZAPINE 15 MG PO TABS: 15.0000 mg | ORAL_TABLET | Freq: Every day | ORAL | 0 refills | Status: DC

## 2021-02-21 ENCOUNTER — Emergency Department (HOSPITAL_COMMUNITY): Payer: Medicare Other

## 2021-02-21 ENCOUNTER — Encounter (HOSPITAL_COMMUNITY): Payer: Self-pay | Admitting: Emergency Medicine

## 2021-02-21 ENCOUNTER — Observation Stay (HOSPITAL_COMMUNITY)
Admission: EM | Admit: 2021-02-21 | Discharge: 2021-02-23 | Disposition: A | Discharge status: Home / Self Care | Payer: Medicare Other | Attending: Internal Medicine | Admitting: Internal Medicine

## 2021-02-21 ENCOUNTER — Other Ambulatory Visit: Payer: Self-pay

## 2021-02-21 DIAGNOSIS — J441 Chronic obstructive pulmonary disease with (acute) exacerbation: Principal | ICD-10-CM

## 2021-02-21 DIAGNOSIS — E1140 Type 2 diabetes mellitus with diabetic neuropathy, unspecified: Secondary | ICD-10-CM | POA: Diagnosis not present

## 2021-02-21 DIAGNOSIS — Z20822 Contact with and (suspected) exposure to covid-19: Secondary | ICD-10-CM | POA: Diagnosis not present

## 2021-02-21 DIAGNOSIS — R0602 Shortness of breath: Secondary | ICD-10-CM | POA: Diagnosis not present

## 2021-02-21 DIAGNOSIS — Z7984 Long term (current) use of oral hypoglycemic drugs: Secondary | ICD-10-CM | POA: Insufficient documentation

## 2021-02-21 DIAGNOSIS — R000 Tachycardia, unspecified: Secondary | ICD-10-CM | POA: Diagnosis not present

## 2021-02-21 DIAGNOSIS — J019 Acute sinusitis, unspecified: Secondary | ICD-10-CM | POA: Diagnosis not present

## 2021-02-21 DIAGNOSIS — Z79899 Other long term (current) drug therapy: Secondary | ICD-10-CM | POA: Diagnosis not present

## 2021-02-21 DIAGNOSIS — I10 Essential (primary) hypertension: Secondary | ICD-10-CM | POA: Insufficient documentation

## 2021-02-21 DIAGNOSIS — J449 Chronic obstructive pulmonary disease, unspecified: Secondary | ICD-10-CM | POA: Diagnosis not present

## 2021-02-21 DIAGNOSIS — J45909 Unspecified asthma, uncomplicated: Secondary | ICD-10-CM | POA: Insufficient documentation

## 2021-02-21 DIAGNOSIS — Z853 Personal history of malignant neoplasm of breast: Secondary | ICD-10-CM | POA: Insufficient documentation

## 2021-02-21 DIAGNOSIS — J329 Chronic sinusitis, unspecified: Secondary | ICD-10-CM

## 2021-02-21 LAB — CBC WITH DIFFERENTIAL/PLATELET
Abs Immature Granulocytes: 0.01 10*3/uL (ref 0.00–0.07)
Basophils Absolute: 0 10*3/uL (ref 0.0–0.1)
Basophils Relative: 1 %
Eosinophils Absolute: 0.4 10*3/uL (ref 0.0–0.5)
Eosinophils Relative: 8 %
HCT: 40.6 % (ref 36.0–46.0)
Hemoglobin: 14 g/dL (ref 12.0–15.0)
Immature Granulocytes: 0 %
Lymphocytes Relative: 49 %
Lymphs Abs: 2.4 10*3/uL (ref 0.7–4.0)
MCH: 31.1 pg (ref 26.0–34.0)
MCHC: 34.5 g/dL (ref 30.0–36.0)
MCV: 90.2 fL (ref 80.0–100.0)
Monocytes Absolute: 0.4 10*3/uL (ref 0.1–1.0)
Monocytes Relative: 8 %
Neutro Abs: 1.6 10*3/uL — ABNORMAL LOW (ref 1.7–7.7)
Neutrophils Relative %: 34 %
Platelets: 294 10*3/uL (ref 150–400)
RBC: 4.5 MIL/uL (ref 3.87–5.11)
RDW: 13 % (ref 11.5–15.5)
WBC: 4.8 10*3/uL (ref 4.0–10.5)
nRBC: 0 % (ref 0.0–0.2)

## 2021-02-21 LAB — BASIC METABOLIC PANEL
Anion gap: 13 (ref 5–15)
BUN: <5 mg/dL — ABNORMAL LOW (ref 8–23)
CO2: 25 mmol/L (ref 22–32)
Calcium: 9.3 mg/dL (ref 8.9–10.3)
Chloride: 101 mmol/L (ref 98–111)
Creatinine, Ser: 0.77 mg/dL (ref 0.44–1.00)
GFR, Estimated: >60 mL/min (ref >60)
Glucose, Bld: 114 mg/dL — ABNORMAL HIGH (ref 70–99)
Potassium: 3.5 mmol/L (ref 3.5–5.1)
Sodium: 139 mmol/L (ref 135–145)

## 2021-02-21 LAB — SARS CORONAVIRUS 2 (TAT 6-24 HRS): SARS Coronavirus 2: NEGATIVE

## 2021-02-21 MED ORDER — ACETAMINOPHEN 650 MG RE SUPP: 650.0000 mg | Freq: Four times a day (QID) | RECTAL | Status: DC | PRN

## 2021-02-21 MED ORDER — BENZONATATE 100 MG PO CAPS: 100.0000 mg | ORAL_CAPSULE | Freq: Three times a day (TID) | ORAL | Status: DC | PRN

## 2021-02-21 MED ORDER — PREDNISONE 20 MG PO TABS
40.0000 mg | ORAL_TABLET | Freq: Every day | ORAL | Status: DC
  Administered 2021-02-22 – 2021-02-23 (×2): 40 mg via ORAL
  Filled 2021-02-21 (×2): qty 2

## 2021-02-21 MED ORDER — ONDANSETRON HCL 4 MG PO TABS: 4.0000 mg | ORAL_TABLET | Freq: Four times a day (QID) | ORAL | Status: DC | PRN

## 2021-02-21 MED ORDER — HYDROCHLOROTHIAZIDE 25 MG PO TABS
25.0000 mg | ORAL_TABLET | Freq: Every day | ORAL | Status: DC
  Administered 2021-02-21 – 2021-02-23 (×3): 25 mg via ORAL
  Filled 2021-02-21 (×3): qty 1

## 2021-02-21 MED ORDER — METOPROLOL TARTRATE 25 MG PO TABS
25.0000 mg | ORAL_TABLET | Freq: Two times a day (BID) | ORAL | Status: DC
  Administered 2021-02-21 – 2021-02-23 (×5): 25 mg via ORAL
  Filled 2021-02-21 (×5): qty 1

## 2021-02-21 MED ORDER — IPRATROPIUM-ALBUTEROL 0.5-2.5 (3) MG/3ML IN SOLN
3.0000 mL | RESPIRATORY_TRACT | Status: DC
  Administered 2021-02-21 – 2021-02-23 (×15): 3 mL via RESPIRATORY_TRACT
  Filled 2021-02-21 (×14): qty 3

## 2021-02-21 MED ORDER — MAGNESIUM SULFATE 2 GM/50ML IV SOLN
2.0000 g | Freq: Once | INTRAVENOUS | Status: AC
  Administered 2021-02-21: 2 g via INTRAVENOUS
  Filled 2021-02-21: qty 50

## 2021-02-21 MED ORDER — HYDROXYZINE HCL 25 MG PO TABS: 25.0000 mg | ORAL_TABLET | Freq: Three times a day (TID) | ORAL | Status: DC | PRN

## 2021-02-21 MED ORDER — GUAIFENESIN-DM 100-10 MG/5ML PO SYRP
5.0000 mL | ORAL_SOLUTION | ORAL | Status: DC | PRN
  Administered 2021-02-22: 5 mL via ORAL
  Filled 2021-02-21: qty 5

## 2021-02-21 MED ORDER — ONDANSETRON HCL 4 MG/2ML IJ SOLN: 4.0000 mg | Freq: Four times a day (QID) | INTRAMUSCULAR | Status: DC | PRN

## 2021-02-21 MED ORDER — MOMETASONE FURO-FORMOTEROL FUM 200-5 MCG/ACT IN AERO
2.0000 | INHALATION_SPRAY | Freq: Two times a day (BID) | RESPIRATORY_TRACT | Status: DC
  Administered 2021-02-21 – 2021-02-23 (×4): 2 via RESPIRATORY_TRACT
  Filled 2021-02-21: qty 8.8

## 2021-02-21 MED ORDER — IPRATROPIUM-ALBUTEROL 0.5-2.5 (3) MG/3ML IN SOLN
5.0000 mL | Freq: Once | RESPIRATORY_TRACT | Status: DC
  Filled 2021-02-21: qty 3

## 2021-02-21 MED ORDER — AZITHROMYCIN 250 MG PO TABS
500.0000 mg | ORAL_TABLET | Freq: Every day | ORAL | Status: AC
  Administered 2021-02-21: 500 mg via ORAL
  Filled 2021-02-21: qty 2

## 2021-02-21 MED ORDER — AMLODIPINE BESYLATE 10 MG PO TABS
10.0000 mg | ORAL_TABLET | Freq: Every day | ORAL | Status: DC
  Administered 2021-02-22 – 2021-02-23 (×2): 10 mg via ORAL
  Filled 2021-02-21 (×2): qty 1

## 2021-02-21 MED ORDER — PREGABALIN 50 MG PO CAPS
100.0000 mg | ORAL_CAPSULE | Freq: Three times a day (TID) | ORAL | Status: DC
  Administered 2021-02-21 – 2021-02-23 (×8): 100 mg via ORAL
  Filled 2021-02-21: qty 1
  Filled 2021-02-21 (×7): qty 2

## 2021-02-21 MED ORDER — SODIUM CHLORIDE 0.9 % IV SOLN
1.0000 g | INTRAVENOUS | Status: DC
  Administered 2021-02-21 – 2021-02-22 (×2): 1 g via INTRAVENOUS
  Filled 2021-02-21 (×2): qty 10

## 2021-02-21 MED ORDER — AZITHROMYCIN 250 MG PO TABS
250.0000 mg | ORAL_TABLET | Freq: Every day | ORAL | Status: DC
  Administered 2021-02-22: 250 mg via ORAL
  Filled 2021-02-21: qty 1

## 2021-02-21 MED ORDER — POLYETHYLENE GLYCOL 3350 17 G PO PACK: 17.0000 g | PACK | Freq: Every day | ORAL | Status: DC | PRN

## 2021-02-21 MED ORDER — METHYLPREDNISOLONE SODIUM SUCC 125 MG IJ SOLR
125.0000 mg | Freq: Once | INTRAMUSCULAR | Status: AC
  Administered 2021-02-21: 125 mg via INTRAVENOUS
  Filled 2021-02-21: qty 2

## 2021-02-21 MED ORDER — ACETAMINOPHEN 325 MG PO TABS
650.0000 mg | ORAL_TABLET | Freq: Four times a day (QID) | ORAL | Status: DC | PRN
  Administered 2021-02-22: 650 mg via ORAL
  Filled 2021-02-21: qty 2

## 2021-02-21 MED ORDER — SODIUM CHLORIDE 0.9% FLUSH
3.0000 mL | Freq: Two times a day (BID) | INTRAVENOUS | Status: DC
  Administered 2021-02-21 – 2021-02-23 (×5): 3 mL via INTRAVENOUS

## 2021-02-21 MED ORDER — ALBUTEROL (5 MG/ML) CONTINUOUS INHALATION SOLN
15.0000 mg/h | INHALATION_SOLUTION | Freq: Once | RESPIRATORY_TRACT | Status: AC
  Administered 2021-02-21: 15 mg/h via RESPIRATORY_TRACT
  Filled 2021-02-21: qty 20

## 2021-02-21 MED ORDER — LOSARTAN POTASSIUM 50 MG PO TABS
100.0000 mg | ORAL_TABLET | Freq: Every day | ORAL | Status: DC
  Administered 2021-02-21 – 2021-02-23 (×3): 100 mg via ORAL
  Filled 2021-02-21 (×3): qty 2

## 2021-02-21 MED ORDER — ENOXAPARIN SODIUM 40 MG/0.4ML ~~LOC~~ SOLN
40.0000 mg | SUBCUTANEOUS | Status: DC
  Administered 2021-02-21 – 2021-02-23 (×3): 40 mg via SUBCUTANEOUS
  Filled 2021-02-21 (×3): qty 0.4

## 2021-02-21 NOTE — H&P (Signed)
Date: 02/21/2021               Patient Name:  [name] MRN: [id number]  DOB: 03/03/1951 Age / Sex: 70 y.o., female   PCP: Mosetta Anis, MD              Medical Service: Internal Medicine Teaching Service              Attending Physician: Dr. Jimmye Norman Elaina Pattee, MD    First Contact: MS Gwynneth Albright Pager: [phone]  Second Contact: Dr. Allyson Sabal Pager: [phone]  Third Contact Dr. Charleen Kirks Pager: [phone]       After Hours (After 5p/  First Contact Pager: [phone]  weekends / holidays): Second Contact Pager: [phone]   Chief Complaint: Shortness of breath   History of Present Illness:  Mora Bellman. Tison is a 70 y.o. woman with a medical history of COPD, asthma, chronic cigarette use, HTN, and HLD who presents with worsening shortness of breath. Patient was seen by [facility] on 11/21 (telephone encounter) and 12/11/20 for COPD exacerbations. After most recent encounter in February, patient completed a 7-day course of prednisone (40 mg, daily) and provided a sample of Spiriva (2 puffs, daily). Noted some transient relief while taking these medications; however, her breathing has since steadily worsened. She has been applying her home inhaler and nebulizer with only minimal relief. Notes concurrent sinus and chest congestion since February with a cough that was initially dry but now productive of yellow sputum. Has also had chills and chest tightness with cough. Denies fever. Notes difficulty lying flat without feeling short of breath and has had to sleep sitting up, which has been uncomfortable and is causing lower back pain. She notes "feeling scared" and expressed frustration with respect to current medical condition, stating that she just wants to feel better to enjoy her life but does not know where to go to get help.   ROS: (+): Nausea  (-): Headache, vision changes, vomiting, abdominal pain, peripheral edema  Past Medical History:   1) COPD - Diagnosed  >10 year ago  - Multiple exacerbations in the past few years (12/11/20; 10/06/20; 04/24/2019; 04/06/2019; 02/28/2019)  - Home medications:              1) SABA: Albuterol              2) SAMA: Duoneb             3) ICS/LABA: Breo-Ellipta              4) LAMA: Tiotropium  - Last PFT (06/23/2015): FEV1- 68%, FVC- 94%   Past Medical History:  Diagnosis Date  . Acute sinusitis 01/09/2019  . Anxiety   . Arthritis    "fingers, knees" (08/16/2018)  . Asthma   . Atopic dermatitis 03/20/2018  . Axillary hidradenitis suppurativa 10/13/2018  . Cancer of right breast ([facility]) 1991   s/p lumpectomy, chemotherapy and radiation therapy in 1991. Mammogram in 2007 was normal.  . Constipated    h/o  . COPD (chronic obstructive pulmonary disease) ([place])    History of multiple hospital admissions for exercabation   . COPD exacerbation ([place]) 01/01/2019  . COPD with acute exacerbation ([facility]) 01/14/2019  . COPD with exacerbation ([facility]) 04/06/2009   Qualifier: Diagnosis of  By: Eyvonne Mechanic MD, Vijay    . Depression   . Diarrhea    h/o  . GERD (gastroesophageal reflux disease)   . Headache    "a few times/month" (08/16/2018  .  Heart murmur 10/05/11   "first time I ever heard I had one was today"  . History of breast cancer 12/01/2012   Pt with h/o breast CA s/p lumpectomy with chemo/radiation in 1991. Pt mammogram 2011 was unremarkable. Had CT chest 10/2012 in ED for SOB and showed spiculated nodule with lymph node. 12/04/12: Birads 2; repeat diagnostic mammogram in 1 year.    . Hyperlipidemia   . Hypertension   . Lower extremity edema 09/21/2018  . Obesity   . Personal history of chemotherapy   . Personal history of radiation therapy   . Pneumonia    "couple times in the last 10-15 yrs" (08/16/2018)  . QT prolongation 08/08/2014  . Seasonal allergies 02/25/2017  . Shortness of breath 10/05/11   "at rest; lying down; w/exertion"  . Sigmoid diverticulitis 80/2008  . Tobacco abuse   . Type 2 diabetes mellitus ([place] [facility])  05/14/2009  . Type II diabetes mellitus ([facility])     Meds: Current Meds  Medication Sig  . albuterol (PROVENTIL HFA) 108 (90 Base) MCG/ACT inhaler Inhale 1-2 puffs into the lungs every 6 (six) hours as needed for wheezing or shortness of breath.  Marland Kitchen amLODipine (NORVASC) 10 MG tablet Take 1 tablet (10 mg total) by mouth daily.  . Blood Glucose Monitoring Suppl (ONETOUCH VERIO) w/Device KIT Use to check blood sugar once daily. (E11.8)  . DULoxetine (CYMBALTA) 60 MG capsule Take 1 capsule (60 mg total) by mouth daily.  Marland Kitchen glucose blood (ONETOUCH VERIO) test strip Use to check blood sugar once daily. (E11.8)  . hydrochlorothiazide (HYDRODIURIL) 25 MG tablet Take 1 tablet (25 mg total) by mouth daily.  . hydrOXYzine (ATARAX/VISTARIL) 25 MG tablet Take 1 tablet (25 mg total) by mouth every 8 (eight) hours as needed. (Patient taking differently: Take 25 mg by mouth every 8 (eight) hours as needed for itching or anxiety.)  . ipratropium-albuterol (DUONEB) 0.5-2.5 (3) MG/3ML SOLN Take 3 mLs by nebulization every 6 (six) hours as needed. (Patient taking differently: Take 3 mLs by nebulization every 6 (six) hours as needed (shortness of breath/wheezing.).)  . losartan (COZAAR) 100 MG tablet Take 1 tablet (100 mg total) by mouth daily.  . metFORMIN (GLUCOPHAGE) 1000 MG tablet TAKE 1 TABLET(1000 MG) BY MOUTH TWICE DAILY (Patient taking differently: Take 1,000 mg by mouth 2 (two) times daily with a meal.)  . metoprolol tartrate (LOPRESSOR) 25 MG tablet Take 1 tablet (25 mg total) by mouth 2 (two) times daily.  . mirtazapine (REMERON) 15 MG tablet Take 1 tablet (15 mg total) by mouth at bedtime. (Patient taking differently: Take 15 mg by mouth at bedtime as needed (sleep).)  . OneTouch Delica Lancets 16R MISC Use to check blood sugar once daily (E11.8)  . OVER THE COUNTER MEDICATION Take 1 Dose by mouth daily as needed (cough). Liquid cough medication  . pregabalin (LYRICA) 100 MG capsule Take 1 capsule (100 mg  total) by mouth 3 (three) times daily.  . simvastatin (ZOCOR) 40 MG tablet TAKE 1 TABLET(40 MG) BY MOUTH DAILY AT 6 PM (Patient taking differently: Take 40 mg by mouth every evening.)   Allergies: Allergies as of 02/21/2021 - Review Complete 02/21/2021  Allergen Reaction Noted  . Ace inhibitors Swelling 12/26/2014    Family History:  Per chart review, patient's mother has a history of cancer (unsure of type, age of diagnosis, etc.). Patient also has a daughter who diagnosed with breast cancer at the age of 63.   Social History:  Patient currently lives  in [place], [place] with her husband of 46 years. Notes that they live with 3 dogs together and enjoy visiting their grandchildren. States that she has been a long-time smoker and unable to specify duration of cigarette use. Notes that her husband recently quit smoking, and she has been able to cut back steadily but still smokes currently. Per chart review, patient has smoked 0.5 packs per day for 45 years (22.5 pack year history). Also uses alcohol, drinking 4 cans of beer per week. No substance use noted. Patient has also had recent financial difficulties, limiting her ability to afford medication and take them as prescribed.    Physical Exam: Blood pressure (!) 160/72, pulse 94, temperature 98.2 F (36.8 C), resp. rate 19, height _0  (1.676 m), weight 90 kg, SpO2 100 %.  General: Alert, awake; using short phrases due to difficulty catching her breath; tearful during encounter while describing recent medical issues and worsening shortness of breath; appears frustrated and anxious about current condition HENT: Normocephalic, atraumatic  Eyes: symmetric, aligned; EOM intact  Cardiovascular: Tachycardic rate; regular rhythm; no murmurs, rubs, gallops; 2+ radial, pedal pulses  Lungs: Increased work of breathing; expiratory wheezing, all lung fields; no crackles  Abdomen: Normal bowel sounds; soft, non-tender, non-distended Extremities: No  swelling of bilateral lower extremities; no erythema, cyanosis Neuro: Pain to light touch, dorsal and ventral aspects of bilateral feet; normal sensation to light touch, peripheral upper extremities; normal muscle tone throughout; no confusion    Skin: Warm, dry  Psychiatric: Normal attention, speech    Labs (on admission):   CBC Latest Ref Rng & Units 02/21/2021  WBC 4.0 - 10.5 K/uL 4.8  Hemoglobin 12.0 - 15.0 g/dL 14.0  Hematocrit 36.0 - 46.0 % 40.6  Platelets 150 - 400 K/uL 294   BMP Latest Ref Rng & Units 02/21/2021  Glucose 70 - 99 mg/dL 114(H)  BUN 8 - 23 mg/dL <5(L)  Creatinine 0.44 - 1.00 mg/dL 0.77  BUN/Creat Ratio 12 - 28 -  Sodium 135 - 145 mmol/L 139  Potassium 3.5 - 5.1 mmol/L 3.5  Chloride 98 - 111 mmol/L 101  CO2 22 - 32 mmol/L 25  Calcium 8.9 - 10.3 mg/dL 9.3   SARS COV-2: negative   Imaging (on admission):   EKG: personally reviewed my interpretation is sinus tachycardia w/ right atrial enlargement and ST elevations in V1 (unchanged from prior EKG in 2020)   CXR: personally reviewed my interpretation is pulmonary hyperinflation (chronic). No other pulmonary edema, effusions, or enlarged cardiac silhouette.   Assessment & Plan by Problem: Active Problems:   COPD with acute exacerbation ([place])  In summary, Gina Leblond is a 70 y.o. woman with a history of COPD w/ multiple exacerbations and chronic cigarette use who presents with worsening shortness of breath secondary to acute COPD exacerbation.   #COPD w/ Acute Exacerbation Patient with a history of multiple COPD exacerbations in the past year presents with worsening shortness of breath, increased cough, and increased sputum production. On exam, patient is tachypneic with diffuse expiratory wheezing and increased work of breathing. Most likely diagnosis is acute severe COPD exacerbation secondary to continued cigarette use and possible sinusitis. Given the increased frequency of her exacerbations, patient's  condition is likely worsening. Her continued cigarette use and possible financial barriers to affording home medications puts her at increased risk of continued worsening of her disease. Per prior PFT, patient COPD was staged at GOLD 2. Based on her symptoms (COPD Assessment Test > 10, multiple exacerbations/hospitalizations), patient  is likely GOLD group D with significant risk for future exacerbations. Administration of a LABA w/ LAMA or ICS w/ LABA is appropriate, and patient has been prescribed this home regimen. For acute exacerbation, patient was given Solumedrol on admission and will be transitioned to 5-day course of prednisone (40 mg). Will also receive prophylactic course of azithromycin. Noninvasive ventilation is not indicated at this time as patient is not experiencing respiratory failure (not hypoxic or hypercarbic). During admission, it will be crucial to encourage smoking cessation and provide assistance with affording home medications if needed.  - Start Duoneb 3 mL Q4H - Start Dulera 2 puffs daily   - Start Azithromycin 250 mg daily (5 day course) - Plan to start Prednisone 40 mg PO daily on 04/17  - Received IV Solumedrol 125 mg  - Received IV Ceftriaxone 1 g   #Hypertension Per chart review, patient's blood pressure has been poorly controlled. On admission, BP was 172/91. Will continue home medications for now and assess need to escalate medical therapy.  - Home Losartan 100 mg PO daily  - Home Metoprolol 25 mg PO BID - Home HCTZ 25 mg PO daily  - Home Amlodipine 10 mg PO daily   #Diabetic Neuropathy Patient with a history of well-controlled T2DM (last A1c on 02/03 was 5.7) notes pain to light touch at bilateral feet.  - Home pregabalin 100 mg PO TID   Diet: Full PQS:OXUJ [id number]:[id number] Prior to Admission Living Arrangement: Home  Anticipated Discharge Location: Home Barriers to Discharge: Continued medical evaluation   Dispo: Admit patient to Observation with  expected length of stay less than 2 midnights.  Signed: Virl Axe, MD 02/21/2021, 1:59 PM  Pager: [phone]

## 2021-02-21 NOTE — ED Notes (Signed)
Report attempted. Nurse not available.

## 2021-02-21 NOTE — ED Provider Notes (Signed)
[facility] EMERGENCY DEPARTMENT Provider Note   CSN: [id number] Arrival date & time: 02/21/21  0327     History Chief Complaint  Patient presents with  . SOB / COPD    [name] is a 70 y.o. female.  HPI     This is a 70 year old female with a history of COPD, hypertension, hyperlipidemia who presents with worsening shortness of breath.  Patient reports that several weeks ago she was seen in the office for worsening shortness of breath.  She was started on steroids and antibiotics with transient improvement.  However, she states once the steroids were off she has had progressive worsening shortness of breath.  She was using her nebulizer at home with minimal relief.  She not had any fevers or cough.  No known sick contacts or COVID exposures.  She is fully vaccinated.  She has required admission in the past for her COPD.  No history of intubation.  She denies chest pain.  Past Medical History:  Diagnosis Date  . Acute sinusitis 01/09/2019  . Anxiety   . Arthritis    "fingers, knees" (08/16/2018)  . Asthma   . Atopic dermatitis 03/20/2018  . Axillary hidradenitis suppurativa 10/13/2018  . Cancer of right breast ([place]) 1991   s/p lumpectomy, chemotherapy and radiation therapy in 1991. Mammogram in 2007 was normal.  . Constipated    h/o  . COPD (chronic obstructive pulmonary disease) ([place])    History of multiple hospital admissions for exercabation   . COPD exacerbation ([place]) 01/01/2019  . COPD with acute exacerbation ([place]) 01/14/2019  . COPD with exacerbation ([place]) 04/06/2009   Qualifier: Diagnosis of  By: Eyvonne Mechanic MD, Vijay    . Depression   . Diarrhea    h/o  . GERD (gastroesophageal reflux disease)   . Headache    "a few times/month" (08/16/2018  . Heart murmur 10/05/11   "first time I ever heard I had one was today"  . History of breast cancer 12/01/2012   Pt with h/o breast CA s/p lumpectomy with chemo/radiation in 1991. Pt mammogram 2011 was  unremarkable. Had CT chest 10/2012 in ED for SOB and showed spiculated nodule with lymph node. 12/04/12: Birads 2; repeat diagnostic mammogram in 1 year.    . Hyperlipidemia   . Hypertension   . Lower extremity edema 09/21/2018  . Obesity   . Personal history of chemotherapy   . Personal history of radiation therapy   . Pneumonia    "couple times in the last 10-15 yrs" (08/16/2018)  . QT prolongation 08/08/2014  . Seasonal allergies 02/25/2017  . Shortness of breath 10/05/11   "at rest; lying down; w/exertion"  . Sigmoid diverticulitis 80/2008  . Tobacco abuse   . Type 2 diabetes mellitus ([place]) 05/14/2009  . Type II diabetes mellitus [facility])     Patient Active Problem List   Diagnosis Date Noted  . Breast nodule 04/22/2020  . Chronic hip pain, right 06/22/2019  . Diabetic neuropathy ([place]) 05/21/2019  . Type 2 diabetes mellitus with complication, without long-term current use of insulin ([place]) 12/26/2018  . Chronic recurrent sinusitis   . Chronic obstructive pulmonary disease with bronchospasm ([place]) 12/24/2014  . Dyslipidemia associated with type 2 diabetes mellitus ([place]) 06/14/2014  . Personal history of breast cancer 12/01/2012  . Primary osteoarthritis of right knee 09/29/2012  . GAD (generalized anxiety disorder) 12/30/2009  . Tobacco abuse 04/06/2009  . Hypertension, benign essential, goal below 140/90 04/06/2009    Past Surgical  History:  Procedure Laterality Date  . ABDOMINAL HYSTERECTOMY    . ANTERIOR CERVICAL DECOMP/DISCECTOMY FUSION  2012   "Dr. Lynann Bologna  put plate in; did something to my vertebrae"  . BACK SURGERY    . BREAST LUMPECTOMY Right 1991  . DOBUTAMINE STRESS ECHO  08/2004   Inferior ischemia, normal LV systolic function, no significant CAD     OB History   No obstetric history on file.     Family History  Problem Relation Age of Onset  . Cancer Mother   . Breast cancer Daughter 31    Social History   Tobacco Use  . Smoking status: Current Every  Day Smoker    Packs/day: 0.50    Years: 45.00    Pack years: 22.50    Types: Cigarettes  . Smokeless tobacco: Never Used  . Tobacco comment: 1/3/pk every 2-3 days   Vaping Use  . Vaping Use: Never used  Substance Use Topics  . Alcohol use: Yes    Alcohol/week: 4.0 standard drinks    Types: 4 Cans of beer per week    Comment: 08/16/2018 "weekends only"  . Drug use: No     Home Medications Prior to Admission medications   Medication Sig Start Date End Date Taking? Authorizing Provider  albuterol (PROVENTIL HFA) 108 (90 Base) MCG/ACT inhaler Inhale 1-2 puffs into the lungs every 6 (six) hours as needed for wheezing or shortness of breath. 12/11/20  Yes Katsadouros, Vasilios, MD  amLODipine (NORVASC) 10 MG tablet Take 1 tablet (10 mg total) by mouth daily. 04/22/20  Yes Mosetta Anis, MD  Blood Glucose Monitoring Suppl [facility] VERIO) w/Device KIT Use to check blood sugar once daily. (E11.8) 03/28/19  Yes [name], MD  DULoxetine (CYMBALTA) 60 MG capsule Take 1 capsule (60 mg total) by mouth daily. 12/17/20  Yes Jose Persia, MD  glucose blood (ONETOUCH VERIO) test strip Use to check blood sugar once daily. (E11.8) 03/28/19  Yes [name], MD  hydrochlorothiazide (HYDRODIURIL) 25 MG tablet Take 1 tablet (25 mg total) by mouth daily. 04/22/20  Yes Mosetta Anis, MD  hydrOXYzine (ATARAX/VISTARIL) 25 MG tablet Take 1 tablet (25 mg total) by mouth every 8 (eight) hours as needed. Patient taking differently: Take 25 mg by mouth every 8 (eight) hours as needed for itching or anxiety. 09/02/20  Yes Davonna Belling, MD  ipratropium-albuterol (DUONEB) 0.5-2.5 (3) MG/3ML SOLN Take 3 mLs by nebulization every 6 (six) hours as needed. Patient taking differently: Take 3 mLs by nebulization every 6 (six) hours as needed (shortness of breath/wheezing.). 10/06/20  Yes Jose Persia, MD  losartan (COZAAR) 100 MG tablet Take 1 tablet (100 mg total) by mouth daily. 04/22/20  Yes Mosetta Anis,  MD  metFORMIN (GLUCOPHAGE) 1000 MG tablet TAKE 1 TABLET(1000 MG) BY MOUTH TWICE DAILY Patient taking differently: Take 1,000 mg by mouth 2 (two) times daily with a meal. 12/18/19  Yes Mosetta Anis, MD  metoprolol tartrate (LOPRESSOR) 25 MG tablet Take 1 tablet (25 mg total) by mouth 2 (two) times daily. 04/22/20  Yes Mosetta Anis, MD  mirtazapine (REMERON) 15 MG tablet Take 1 tablet (15 mg total) by mouth at bedtime. Patient taking differently: Take 15 mg by mouth at bedtime as needed (sleep). 02/13/21  Yes Aslam, Loralyn [place], MD  OneTouch Delica Lancets 58K MISC Use to check blood sugar once daily (E11.8) 03/28/19  Yes [name], MD  OVER THE COUNTER MEDICATION Take 1 Dose by mouth  daily as needed (cough). Liquid cough medication   Yes [provider]  pregabalin (LYRICA) 100 MG capsule Take 1 capsule (100 mg total) by mouth 3 (three) times daily. 09/02/20  Yes Davonna Belling, MD  simvastatin (ZOCOR) 40 MG tablet TAKE 1 TABLET(40 MG) BY MOUTH DAILY AT 6 PM Patient taking differently: Take 40 mg by mouth every evening. 05/13/20  Yes [name], MD  azithromycin (ZITHROMAX) 250 MG tablet Take as instructed on package. Patient not taking: Reported on 02/21/2021 10/06/20   Jose Persia, MD  fluticasone furoate-vilanterol (BREO ELLIPTA) 100-25 MCG/INH AEPB Inhale 1 puff into the lungs daily. Patient not taking: No sig reported 12/11/20   Riesa Pope, MD  Tiotropium Bromide Monohydrate (SPIRIVA RESPIMAT) 2.5 MCG/ACT AERS Inhale 2 puffs into the lungs daily. Patient not taking: No sig reported 04/29/20   Mosetta Anis, MD    Allergies    Ace inhibitors  Review of Systems   Review of Systems  Constitutional: Negative for fever.  Respiratory: Positive for shortness of breath and wheezing.   Cardiovascular: Negative for chest pain and leg swelling.  Gastrointestinal: Negative for abdominal pain, nausea and vomiting.  All other systems reviewed and are  negative.   Physical Exam Updated Vital Signs BP (!) 184/68   Pulse (!) 115   Resp 19   Ht 1.676 m ('5\' 6"' )   Wt 90 kg   SpO2 98%   BMI 32.02 kg/m   Physical Exam Vitals and nursing note reviewed.  Constitutional:      Appearance: She is well-developed.     Comments: Ill-appearing but nontoxic  HENT:     Head: Normocephalic and atraumatic.     Mouth/Throat:     Mouth: Mucous membranes are moist.  Eyes:     Pupils: Pupils are equal, round, and reactive to light.  Cardiovascular:     Rate and Rhythm: Normal rate and regular rhythm.     Heart sounds: Normal heart sounds.  Pulmonary:     Effort: Pulmonary effort is normal. No respiratory distress.     Breath sounds: Wheezing present.     Comments: Tachypnea, accessory muscle use noted, speaking in short sentences, expiratory wheezing noted in all lung fields Abdominal:     General: Bowel sounds are normal.     Palpations: Abdomen is soft.     Tenderness: There is no abdominal tenderness.     Hernia: No hernia is present.  Musculoskeletal:     Cervical back: Neck supple.     Right lower leg: No edema.     Left lower leg: No edema.  Skin:    General: Skin is warm and dry.  Neurological:     Mental Status: She is alert and oriented to person, place, and time.  Psychiatric:        Mood and Affect: Mood normal.     ED Results / Procedures / Treatments   Labs (all labs ordered are listed, but only abnormal results are displayed) Labs Reviewed  CBC WITH DIFFERENTIAL/PLATELET - Abnormal; Notable for the following components:      Result Value   Neutro Abs 1.6 (*)    All other components within normal limits  BASIC METABOLIC PANEL - Abnormal; Notable for the following components:   Glucose, Bld 114 (*)    BUN <5 (*)    All other components within normal limits  SARS CORONAVIRUS 2 (TAT 6-24 HRS)    EKG EKG Interpretation  Date/Time:  Saturday February 21 2021 03:37:40 EDT Ventricular Rate:  101 PR  Interval:  138 QRS Duration: 78 QT Interval:  364 QTC Calculation: 471 R Axis:   76 Text Interpretation: Sinus tachycardia Biatrial enlargement Possible Anterior infarct , age undetermined Abnormal ECG Confirmed by Thayer Jew [phone]) on 02/21/2021 5:50:50 AM   Radiology DG Chest Port 1 View  Result Date: 02/21/2021 CLINICAL DATA:  70 year old female with shortness of breath.  COPD. EXAM: PORTABLE CHEST 1 VIEW COMPARISON:  Portable chest 04/06/2019 and earlier. FINDINGS: Portable AP semi upright view at 0403 hours. Stable large lung volumes. Mediastinal contours remain normal. Visualized tracheal air column is within normal limits. Lung markings appear stable and Allowing for portable technique the lungs are clear. No pneumothorax or pleural effusion. Chronic cervical ACDF, right axillary surgical clips. No acute osseous abnormality identified. IMPRESSION: Chronic pulmonary hyperinflation. No acute cardiopulmonary abnormality. Electronically Signed   By: Genevie Ann M.D.   On: 02/21/2021 04:38    Procedures .Critical Care Performed by: Merryl Hacker, MD Authorized by: Merryl Hacker, MD   Critical care provider statement:    Critical care time (minutes):  35   Critical care was necessary to treat or prevent imminent or life-threatening deterioration of the following conditions:  Respiratory failure   Critical care was time spent personally by me on the following activities:  Discussions with consultants, evaluation of patient's response to treatment, examination of patient, ordering and performing treatments and interventions, ordering and review of laboratory studies, ordering and review of radiographic studies, pulse oximetry, re-evaluation of patient's condition, obtaining history from patient or surrogate and review of old charts     Medications Ordered in ED Medications  ipratropium-albuterol (DUONEB) 0.5-2.5 (3) MG/3ML nebulizer solution 5 mL (5 mLs Nebulization Not Given  02/21/21 0504)  methylPREDNISolone sodium succinate (SOLU-MEDROL) 125 mg/2 mL injection 125 mg (125 mg Intravenous Given 02/21/21 0553)  albuterol (PROVENTIL,VENTOLIN) solution continuous neb (15 mg/hr Nebulization Given 02/21/21 0504)  magnesium sulfate IVPB 2 g 50 mL (0 g Intravenous Stopped 02/21/21 0622)    ED Course  I have reviewed the triage vital signs and the nursing notes.  Pertinent labs & imaging results that were available during my care of the patient were reviewed by me and considered in my medical decision making (see chart for details).    MDM Rules/Calculators/A&P                          Patient presents with shortness of breath.  Worsening over the last several weeks.  She is tachypneic with increased work of breathing on exam.  She has wheezing in all lung fields.  She denies any sick contacts or COVID exposures.  Chest x-ray does not show any evidence of pneumonia or pneumothorax.  EKG shows sinus tachycardia.  Patient was given a continuous DuoNeb and IV magnesium.  She was also given Solu-Medrol.   7:43 AM Patient only reports minimal improvement.  She is speaking in longer sentences but continues to wheeze in all lung fields.  COVID testing is still pending; however, highly suspect that this is a COPD exacerbation.  Feel she will need admission for frequent duo nebs.  Internal medicine teaching service consulted.  Final Clinical Impression(s) / ED Diagnoses Final diagnoses:  COPD exacerbation [facility])    Rx / DC Orders ED Discharge Orders    None       Ethal Gotay, Barbette Hair, MD 02/21/21 [phone]

## 2021-02-21 NOTE — Plan of Care (Signed)
?  Problem: Education: ?Goal: Knowledge of disease and its progression will improve ?Outcome: Progressing ?  ?Problem: Health Behavior/Discharge Planning: ?Goal: Ability to manage health-related needs will improve ?Outcome: Progressing ?  ?Problem: Clinical Measurements: ?Goal: Complications related to the disease process or treatment will be avoided or minimized ?Outcome: Progressing ?Goal: Dialysis access will remain free of complications ?Outcome: Progressing ?  ?Problem: Activity: ?Goal: Activity intolerance will improve ?Outcome: Progressing ?  ?Problem: Fluid Volume: ?Goal: Fluid volume balance will be maintained or improved ?Outcome: Progressing ?  ?Problem: Nutritional: ?Goal: Ability to make appropriate dietary choices will improve ?Outcome: Progressing ?  ?Problem: Respiratory: ?Goal: Respiratory symptoms related to disease process will be avoided ?Outcome: Progressing ?  ?Problem: Self-Concept: ?Goal: Body image disturbance will be avoided or minimized ?Outcome: Progressing ?  ?Problem: Urinary Elimination: ?Goal: Progression of disease will be identified and treated ?Outcome: Progressing ?  ?

## 2021-02-21 NOTE — ED Triage Notes (Signed)
Patient reports worsening SOB /COPD this week unrelieved by home nebulizer treatment .

## 2021-02-21 NOTE — ED Provider Notes (Signed)
MSE was initiated and I personally evaluated the patient and placed orders (if any) at  3:40 AM on February 21, 2021.  Patient with history of COPD here with shortness of breath and wheezing.  Symptoms have been gradually worsening throughout the week this week.  She reports using her home nebulizer without significant improvement.  Denies any fevers at home.  She does report cough.    Nebs ordered, solumedrol, basic labs, and CXR.  Discussed with patient that their care has been initiated.   They are counseled that they will need to remain in the ED until the completion of their workup, including full H&P and results of any tests.  Risks of leaving the emergency department prior to completion of treatment were discussed. Patient was advised to inform ED staff if they are leaving before their treatment is complete. The patient acknowledged these risks and time was allowed for questions.    The patient appears stable so that the remainder of the MSE may be completed by another provider.    Montine Circle, PA-C 02/21/21 0973    Merryl Hacker, MD 02/22/21 [phone]

## 2021-02-21 NOTE — ED Notes (Signed)
2nd attempt for report. Nurse not available.

## 2021-02-22 DIAGNOSIS — J441 Chronic obstructive pulmonary disease with (acute) exacerbation: Secondary | ICD-10-CM | POA: Diagnosis not present

## 2021-02-22 LAB — BASIC METABOLIC PANEL
Anion gap: 10 (ref 5–15)
BUN: 10 mg/dL (ref 8–23)
CO2: 26 mmol/L (ref 22–32)
Calcium: 9.2 mg/dL (ref 8.9–10.3)
Chloride: 101 mmol/L (ref 98–111)
Creatinine, Ser: 0.82 mg/dL (ref 0.44–1.00)
GFR, Estimated: >60 mL/min (ref >60)
Glucose, Bld: 110 mg/dL — ABNORMAL HIGH (ref 70–99)
Potassium: 3.5 mmol/L (ref 3.5–5.1)
Sodium: 137 mmol/L (ref 135–145)

## 2021-02-22 LAB — CBC WITH DIFFERENTIAL/PLATELET
Abs Immature Granulocytes: 0.03 10*3/uL (ref 0.00–0.07)
Basophils Absolute: 0 10*3/uL (ref 0.0–0.1)
Basophils Relative: 0 %
Eosinophils Absolute: 0 10*3/uL (ref 0.0–0.5)
Eosinophils Relative: 0 %
HCT: 39.8 % (ref 36.0–46.0)
Hemoglobin: 13.5 g/dL (ref 12.0–15.0)
Immature Granulocytes: 0 %
Lymphocytes Relative: 15 %
Lymphs Abs: 1.1 10*3/uL (ref 0.7–4.0)
MCH: 31 pg (ref 26.0–34.0)
MCHC: 33.9 g/dL (ref 30.0–36.0)
MCV: 91.5 fL (ref 80.0–100.0)
Monocytes Absolute: 0.7 10*3/uL (ref 0.1–1.0)
Monocytes Relative: 9 %
Neutro Abs: 5.5 10*3/uL (ref 1.7–7.7)
Neutrophils Relative %: 76 %
Platelets: 273 10*3/uL (ref 150–400)
RBC: 4.35 MIL/uL (ref 3.87–5.11)
RDW: 13.3 % (ref 11.5–15.5)
WBC: 7.3 10*3/uL (ref 4.0–10.5)
nRBC: 0 % (ref 0.0–0.2)

## 2021-02-22 LAB — GLUCOSE, CAPILLARY
Glucose-Capillary: 183 mg/dL — ABNORMAL HIGH (ref 70–99)
Glucose-Capillary: 266 mg/dL — ABNORMAL HIGH (ref 70–99)
Glucose-Capillary: 220 mg/dL — ABNORMAL HIGH (ref 70–99)

## 2021-02-22 LAB — HIV ANTIBODY (ROUTINE TESTING W REFLEX): HIV Screen 4th Generation wRfx: NONREACTIVE

## 2021-02-22 LAB — VITAMIN B12: Vitamin B-12: 370 pg/mL (ref 180–914)

## 2021-02-22 MED ORDER — UMECLIDINIUM BROMIDE 62.5 MCG/INH IN AEPB
1.0000 | INHALATION_SPRAY | Freq: Every day | RESPIRATORY_TRACT | Status: DC
  Administered 2021-02-22 – 2021-02-23 (×2): 1 via RESPIRATORY_TRACT
  Filled 2021-02-22: qty 7

## 2021-02-22 MED ORDER — BENZONATATE 100 MG PO CAPS
200.0000 mg | ORAL_CAPSULE | Freq: Three times a day (TID) | ORAL | Status: DC
  Administered 2021-02-22 – 2021-02-23 (×4): 200 mg via ORAL
  Filled 2021-02-22 (×4): qty 2

## 2021-02-22 MED ORDER — LORATADINE 10 MG PO TABS
10.0000 mg | ORAL_TABLET | Freq: Every day | ORAL | Status: DC
  Administered 2021-02-22 – 2021-02-23 (×2): 10 mg via ORAL
  Filled 2021-02-22 (×2): qty 1

## 2021-02-22 MED ORDER — HYDROCOD POLST-CPM POLST ER 10-8 MG/5ML PO SUER: 5.0000 mL | Freq: Two times a day (BID) | ORAL | Status: DC

## 2021-02-22 MED ORDER — HYDROCOD POLST-CPM POLST ER 10-8 MG/5ML PO SUER
5.0000 mL | Freq: Two times a day (BID) | ORAL | Status: AC
Start: 2021-02-22 — End: 2021-02-22
  Administered 2021-02-22: 5 mL via ORAL
  Filled 2021-02-22: qty 5

## 2021-02-22 MED ORDER — FLUTICASONE PROPIONATE 50 MCG/ACT NA SUSP
1.0000 | Freq: Two times a day (BID) | NASAL | Status: DC
  Administered 2021-02-22 – 2021-02-23 (×3): 1 via NASAL
  Filled 2021-02-22: qty 16

## 2021-02-22 NOTE — Plan of Care (Signed)
  Problem: Education: Goal: Knowledge of disease and its progression will improve Outcome: Progressing   Problem: Health Behavior/Discharge Planning: Goal: Ability to manage health-related needs will improve Outcome: Progressing   Problem: Activity: Goal: Activity intolerance will improve Outcome: Progressing   Problem: Nutritional: Goal: Ability to make appropriate dietary choices will improve Outcome: Progressing   Problem: Self-Concept: Goal: Body image disturbance will be avoided or minimized Outcome: Progressing

## 2021-02-22 NOTE — Care Management Important Message (Signed)
Important Message  Patient Details  Name: [name] MRN: [id number] Date of Birth: Feb 28, 1951   Medicare Important Message Given:  Yes     Norina Buzzard, RN 02/22/2021, 9:50 AM

## 2021-02-22 NOTE — Progress Notes (Signed)
   Subjective:   [name] endorses continued cough but is bringing up more phlegm, which has mostly been white. She feels her healing process has begun. However she is having rib pain from coughing.   She endorses a hx of seasonal allergies with nasal congestion, sinus pain, swollen eyes.   Objective:  Vital signs in last 24 hours: Vitals:   02/21/21 1330 02/21/21 1552 02/21/21 1936 02/22/21 0304  BP: (!) 160/72  (!) 144/69 130/67  Pulse: 94  93 85  Resp: 19  20 16   Temp: 98.2 F (36.8 C)  98.4 F (36.9 C) 98.2 F (36.8 C)  TempSrc:      SpO2: 100% 98% 99% 100%  Weight:      Height:       General: Elderly woman sitting up in bed, NAD. CV: normal rate and regular rhythm, no m/r/g. 2+ radial and pedal pulses bilaterally. Pulm: prolonged expiratory phase with expiratory wheezing diffusely throughout, with periods of coughing during deep inspiration. No acute respiratory distress noted. Abdomen: soft, non-tender, non-distended. Normoactive bowel sounds. Skin: warm and dry Neuro: tearful at times  Assessment/Plan:  Active Problems:   COPD with acute exacerbation (HCC)  Acute COPD Exacerbation Patient reports slight improvement in symptoms from yesterday, but still having persistent cough. On exam, she continues to have diffuse expiratory wheezing, but it is improved from yesterday. She is currently on day 1 of 5 of PO prednisone therapy. Will continue duonebs q4h, Incruse Ellipta, and dulera 2 puffs daily.  Added Flonase and Claritin for allergy relief. Received dose of azithromycin today, but will discontinue given that patient has no fever or leukocytosis to suggest any signs of infection.  We will switch from Robitussin-DM to Tussionex and Tessalon Perles for cough relief.  She will need optimization of home inhalers and financial assistance with obtaining these medications.  She will also need continued encouragement for smoking cessation as this will be the primary way to  provide mortality benefit.  Prior to Admission Living Arrangement: Home Anticipated Discharge Location: Home Barriers to Discharge: Continued medical management Dispo: Anticipated discharge in approximately 1-2 day(s).   Virl Axe, MD 02/22/2021, 5:56 AM Pager: [phone] After 5pm on weekdays and 1pm on weekends: On Call pager [phone]

## 2021-02-22 NOTE — Care Management Obs Status (Signed)
[place] NOTIFICATION   Patient Details  Name: [name] MRN: [id number] Date of Birth: [birth year]   Medicare Observation Status Notification Given:  Yes    Norina Buzzard, RN 02/22/2021, 9:55 AM

## 2021-02-23 ENCOUNTER — Other Ambulatory Visit (HOSPITAL_COMMUNITY): Payer: Self-pay

## 2021-02-23 DIAGNOSIS — J329 Chronic sinusitis, unspecified: Secondary | ICD-10-CM | POA: Diagnosis not present

## 2021-02-23 DIAGNOSIS — J441 Chronic obstructive pulmonary disease with (acute) exacerbation: Secondary | ICD-10-CM | POA: Diagnosis not present

## 2021-02-23 LAB — GLUCOSE, CAPILLARY: Glucose-Capillary: 130 mg/dL — ABNORMAL HIGH (ref 70–99)

## 2021-02-23 MED ORDER — PREDNISONE 20 MG PO TABS
40.0000 mg | ORAL_TABLET | Freq: Every day | ORAL | 0 refills | Status: DC
  Filled 2021-02-23: qty 6, 3d supply, fill #0

## 2021-02-23 MED ORDER — AZITHROMYCIN 250 MG PO TABS
ORAL_TABLET | ORAL | 0 refills | Status: DC
  Filled 2021-02-23: qty 3, 3d supply, fill #0

## 2021-02-23 MED ORDER — AZITHROMYCIN 250 MG PO TABS
250.0000 mg | ORAL_TABLET | Freq: Every day | ORAL | Status: DC
  Administered 2021-02-23: 250 mg via ORAL
  Filled 2021-02-23: qty 1

## 2021-02-23 NOTE — Discharge Summary (Addendum)
Name: [name] MRN: [id number] DOB: [birth year] 70 y.o. PCP: Mosetta Anis, MD  Date of Admission: 02/21/2021  3:34 AM Date of Discharge:  02/23/2021 Attending Physician: Velna Ochs, MD  Discharge Diagnosis: 1.  Acute COPD exacerbation secondary to sinusitis   Discharge Medications: Allergies as of 02/23/2021       Reactions   Ace Inhibitors Swelling   Throat swelling. Lisinopril-HCTZ Pt states she in not allergic         Medication List     TAKE these medications    albuterol 108 (90 Base) MCG/ACT inhaler Commonly known as: Proventil HFA Inhale 1-2 puffs into the lungs every 6 (six) hours as needed for wheezing or shortness of breath.   amLODipine 10 MG tablet Commonly known as: NORVASC Take 1 tablet (10 mg total) by mouth daily.   azithromycin 250 MG tablet Commonly known as: ZITHROMAX Take one tablet daily for the next 3 days. Start taking on: February 24, 2021 What changed: additional instructions   DULoxetine 60 MG capsule Commonly known as: CYMBALTA Take 1 capsule (60 mg total) by mouth daily.   fluticasone furoate-vilanterol 100-25 MCG/INH Aepb Commonly known as: BREO ELLIPTA Inhale 1 puff into the lungs daily.   glucose blood test strip Commonly known as: OneTouch Verio Use to check blood sugar once daily. (E11.8)   hydrochlorothiazide 25 MG tablet Commonly known as: HYDRODIURIL Take 1 tablet (25 mg total) by mouth daily.   hydrOXYzine 25 MG tablet Commonly known as: ATARAX/VISTARIL Take 1 tablet (25 mg total) by mouth every 8 (eight) hours as needed. What changed: reasons to take this   ipratropium-albuterol 0.5-2.5 (3) MG/3ML Soln Commonly known as: DUONEB Take 3 mLs by nebulization every 6 (six) hours as needed. What changed: reasons to take this   losartan 100 MG tablet Commonly known as: COZAAR Take 1 tablet (100 mg total) by mouth daily.   metFORMIN 1000 MG tablet Commonly known as: GLUCOPHAGE TAKE 1 TABLET(1000 MG)  BY MOUTH TWICE DAILY What changed: See the new instructions.   metoprolol tartrate 25 MG tablet Commonly known as: LOPRESSOR Take 1 tablet (25 mg total) by mouth 2 (two) times daily.   mirtazapine 15 MG tablet Commonly known as: REMERON Take 1 tablet (15 mg total) by mouth at bedtime. What changed:  when to take this reasons to take this   OneTouch Delica Lancets 69I Misc Use to check blood sugar once daily (E11.8)   OneTouch Verio w/Device Kit Use to check blood sugar once daily. (E11.8)   OVER THE COUNTER MEDICATION Take 1 Dose by mouth daily as needed (cough). Liquid cough medication   predniSONE 20 MG tablet Commonly known as: DELTASONE Take 2 tablets (40 mg total) by mouth daily with breakfast. Start taking on: February 24, 2021   pregabalin 100 MG capsule Commonly known as: Lyrica Take 1 capsule (100 mg total) by mouth 3 (three) times daily.   simvastatin 40 MG tablet Commonly known as: ZOCOR TAKE 1 TABLET(40 MG) BY MOUTH DAILY AT 6 PM What changed: See the new instructions.   Spiriva Respimat 2.5 MCG/ACT Aers Generic drug: Tiotropium Bromide Monohydrate Inhale 2 puffs into the lungs daily.        Disposition and follow-up:   [name] was discharged from [facility] in Stable condition.  At the hospital follow up visit please address:  1.  Acute COPD exacerbation secondary to seasonal allergies.  Discharging home with: 3 more days of prednisone to  finish 5-day course, 3 more days of azithromycin to finish course, and Flonase for allergy relief.  We will also send home with her inpatient inhalers of DuoNebs, Incruse Ellipta, and Dulera.  She notes having financial difficulties with obtaining her home inhalers so please address this at her PCP visit and see if she can be provided with samples.  She will also need referral to pulmonology for optimization of medications.  Continue encouraging smoking cessation.  Follow-up in [facility] in 1  week.  2.  Labs / imaging needed at time of follow-up: CBC  3.  Pending labs/ test needing follow-up: None  Follow-up Appointments:    Hospital Course by problem list: 1.  Acute COPD exacerbation secondary to sinusitis:  Patient with history of COPD Gold stage D (diagnosed greater than 10 years ago with multiple exacerbations in the past few years) and continued cigarette use admitted for severe worsening dyspnea that began in February 2022 when she was last evaluated for the same thing in the [facility].  She notes having financial difficulty doing her lung medications.  She was given a sample of Spiriva at the time to go along with her albuterol, duonebs, and Breo but this has long since finished.  She has been experiencing seasonal allergic rhinitis symptoms and notes being surrounded by trees at home and that her symptoms do appear to worsen when she goes outside.  She has nasal congestion, postnasal drainage, and is beginning to have a more productive cough and large airway congestion (thicker white to pale yellow sputum).  He then triggers her cough and she is somewhat dyspneic with speech, however she has not required any oxygen and her oxygen saturations have remained above 98% on room air.  Had extensive discussions about smoking cessation and she notes that she is trying to cut back as her husband has recently stopped smoking although she is not ready to stop smoking just yet.  She received Solu-Medrol and Rocephin in the ED. she was started on duo nebs every 4 hours, Dulera, Incruse Ellipta, and Flonase.  Robitussin-DM was also started for cough relief.  She has received 2 days of prednisone and 2 days of azithromycin.  On exam today she appears a lot better than when she came in although she still has mild wheezing diffusely.  She continues to remain saturating above 98% on room air.  Will discharge with 3 more days of azithromycin and prednisone to finish out courses.  We will also send home with her  inpatient inhalers of duonebs, Dulera, and Incruse Ellipta as she is likely unable to afford these at home. Lastly, will send her home with flonase to help control allergies.  We will have her follow-up in the internal medicine clinic in 1 week for financial assistance with obtaining inhalers, can also see if there are samples available for her.  Furthermore she will need referral to pulmonology for optimization of medications to see if her COPD can be better managed.  We will need to continue encouraging smoking cessation.  Hypertension.  Continued home losartan 100 mg daily, metoprolol 25 mg twice daily, HCTZ 25 mg daily, and Norvasc 10 mg daily.  Continue these at discharge  Diabetic neuropathy.  Patient with history of well-controlled type 2 diabetes, last A1c on December 11, 2020 was 5.7.  She does endorse pain to light touch of bilateral feet from neuropathy.  Continued her home pregabalin 100 mg 3 times daily.  Continue this at discharge.  Discharge Exam:  BP 132/74 (BP Location: Right Arm)   Pulse 85   Temp 97.6 F (36.4 C) (Oral)   Resp 18   Ht _0  (1.676 m)   Wt 90 kg   SpO2 98%   BMI 32.02 kg/m  Discharge exam:  General: Elderly woman sitting up in bed, NAD. Cardiovascular: Normal rate and regular rhythm, no murmurs rubs or gallops.  2+ radial and pedal pulses bilaterally. Pulmonary: prolonged expiratory phase with mild expiratory wheezing noted diffusely.  No acute respiratory distress noted. Abdomen: Soft, nontender, nondistended.  Normoactive bowel sounds. Skin: Warm and dry. Neurological: AAOx3, no focal deficits noted.  Pertinent Labs, Studies, and Procedures:  CBC Latest Ref Rng & Units 02/22/2021 02/21/2021 10/15/2019  WBC 4.0 - 10.5 K/uL 7.3 4.8 8.8  Hemoglobin 12.0 - 15.0 g/dL 13.5 14.0 13.1  Hematocrit 36.0 - 46.0 % 39.8 40.6 39.5  Platelets 150 - 400 K/uL 273 294 328    BMP Latest Ref Rng & Units 02/22/2021 02/21/2021 04/22/2020  Glucose 70 - 99 mg/dL 110(H) 114(H)  -  BUN 8 - 23 mg/dL 10 <5(L) -  Creatinine 0.44 - 1.00 mg/dL 0.82 0.77 -  BUN/Creat Ratio 12 - 28 - - -  Sodium 135 - 145 mmol/L 137 139 -  Potassium 3.5 - 5.1 mmol/L 3.5 3.5 -  Chloride 98 - 111 mmol/L 101 101 -  CO2 22 - 32 mmol/L 26 25 -  Calcium 8.9 - 10.3 mg/dL 9.2 9.3 8.8   Vitamin B12 - 370  Discharge Instructions: Discharge Instructions     Call MD for:  difficulty breathing, headache or visual disturbances   Complete by: As directed    Call MD for:  extreme fatigue   Complete by: As directed    Call MD for:  hives   Complete by: As directed    Call MD for:  persistant dizziness or light-headedness   Complete by: As directed    Call MD for:  persistant nausea and vomiting   Complete by: As directed    Call MD for:  redness, tenderness, or signs of infection (pain, swelling, redness, odor or green/yellow discharge around incision site)   Complete by: As directed    Call MD for:  severe uncontrolled pain   Complete by: As directed    Call MD for:  temperature >100.4   Complete by: As directed    Diet - low sodium heart healthy   Complete by: As directed    Discharge instructions   Complete by: As directed    Ms. Szeto, it was a pleasure taking care of you during your stay here.  You came in for a COPD exacerbation and we treated you with inhalers and allergy medications.  Please take note of the following information:  1.  Please take your prescribed prednisone and azithromycin as directed for the next 3 days.  2.  We have given you your inhalers that you have used while you were here.  Please use them as needed every day.  3.  Please use your Flonase at home as well for allergies.  4. Please follow-up in the internal medicine clinic in 1 week.  Someone will call you to schedule this appointment.   Increase activity slowly   Complete by: As directed        Signed: Virl Axe, MD 02/23/2021, 2:11 PM   Pager: [phone]

## 2021-02-23 NOTE — Progress Notes (Signed)
Patient given discharge instructions. PIV removed. Pharmacy delivered meds to pt room.  Patient along with personal belongings taken down by staff to vehicle.   Daymon Larsen, RN

## 2021-02-24 ENCOUNTER — Telehealth: Payer: Self-pay | Admitting: Internal Medicine

## 2021-02-24 NOTE — Telephone Encounter (Signed)
TOC [facility] FOR 03/04/2021 @ 9:15 AM WITH DR LEE

## 2021-02-25 NOTE — Telephone Encounter (Signed)
Transition Care Management Follow-up Telephone Call  Date of discharge and from where: 02/23/2021 [facility]  How have you been since you were released from the hospital? "Sort of resting up, feeling better, not totally there."  Any questions or concerns? No  Items Reviewed:  Did the pt receive and understand the discharge instructions provided? Yes , has not read AVS as she is dealing with something that happened to her as inpatient. Talked with Nurse at hospital yesterday regarding this.   Medications obtained and verified? Yes , patient missed doses of prednisone and azithromycin yesterday but is "back on track today."  Other? No   Any new allergies since your discharge? No   Dietary orders reviewed? Patient not following any specific diet.  Do you have support at home? Yes, husband   [place] and Equipment/Supplies: Were home health services ordered? no If so, what is the name of the agency? NA  Has the agency set up a time to come to the patient's home? not applicable Were any new equipment or medical supplies ordered?  No What is the name of the medical supply agency? NA Were you able to get the supplies/equipment? not applicable Do you have any questions related to the use of the equipment or supplies? NA  Functional Questionnaire: (I = Independent and D = Dependent) ADLs: I  Bathing/Dressing- I  Meal Prep- I  Eating- I  Maintaining continence- I  Transferring/Ambulation- I  Managing Meds- I  Follow up appointments reviewed:   PCP Hospital f/u appt confirmed? Yes  [facility] f/u [facility]t confirmed? NA   Are transportation arrangements needed? No   If their condition worsens, is the pt aware to call PCP or go to the Emergency Dept.? Yes Was the patient provided with contact information for the PCP's office or ED? Yes. Also, given main hospital number [phone]) for after hours with instructions to  ask for [facility] Resident on call.  Was to pt encouraged to call back with questions or concerns? Yes  L. Ivis Nicolson, BSN, RN-BC

## 2021-03-04 ENCOUNTER — Ambulatory Visit (INDEPENDENT_AMBULATORY_CARE_PROVIDER_SITE_OTHER): Payer: Medicare Other | Admitting: Internal Medicine

## 2021-03-04 ENCOUNTER — Encounter: Payer: Self-pay | Admitting: Internal Medicine

## 2021-03-04 ENCOUNTER — Other Ambulatory Visit: Payer: Self-pay

## 2021-03-04 VITALS — BP 148/72 | HR 82 | Temp 98.8°F | Ht 66.0 in | Wt 179.0 lb

## 2021-03-04 DIAGNOSIS — J441 Chronic obstructive pulmonary disease with (acute) exacerbation: Secondary | ICD-10-CM | POA: Diagnosis not present

## 2021-03-04 DIAGNOSIS — E118 Type 2 diabetes mellitus with unspecified complications: Secondary | ICD-10-CM | POA: Diagnosis not present

## 2021-03-04 DIAGNOSIS — E1142 Type 2 diabetes mellitus with diabetic polyneuropathy: Secondary | ICD-10-CM | POA: Diagnosis not present

## 2021-03-04 DIAGNOSIS — I10 Essential (primary) hypertension: Secondary | ICD-10-CM | POA: Diagnosis not present

## 2021-03-04 DIAGNOSIS — G629 Polyneuropathy, unspecified: Secondary | ICD-10-CM

## 2021-03-04 DIAGNOSIS — M810 Age-related osteoporosis without current pathological fracture: Secondary | ICD-10-CM | POA: Diagnosis not present

## 2021-03-04 DIAGNOSIS — Z1211 Encounter for screening for malignant neoplasm of colon: Secondary | ICD-10-CM | POA: Diagnosis not present

## 2021-03-04 DIAGNOSIS — F419 Anxiety disorder, unspecified: Secondary | ICD-10-CM

## 2021-03-04 DIAGNOSIS — J449 Chronic obstructive pulmonary disease, unspecified: Secondary | ICD-10-CM | POA: Diagnosis not present

## 2021-03-04 DIAGNOSIS — Z13820 Encounter for screening for osteoporosis: Secondary | ICD-10-CM

## 2021-03-04 LAB — POCT GLYCOSYLATED HEMOGLOBIN (HGB A1C): Hemoglobin A1C: 5.4 % (ref 4.0–5.6)

## 2021-03-04 LAB — GLUCOSE, CAPILLARY: Glucose-Capillary: 85 mg/dL (ref 70–99)

## 2021-03-04 MED ORDER — FLUTICASONE FUROATE-VILANTEROL 100-25 MCG/INH IN AEPB: 1.0000 | INHALATION_SPRAY | Freq: Every day | RESPIRATORY_TRACT | 2 refills | Status: DC

## 2021-03-04 MED ORDER — DULOXETINE HCL 60 MG PO CPEP: 60.0000 mg | ORAL_CAPSULE | Freq: Every day | ORAL | 2 refills | Status: DC

## 2021-03-04 MED ORDER — PREGABALIN 100 MG PO CAPS: 100.0000 mg | ORAL_CAPSULE | Freq: Three times a day (TID) | ORAL | 2 refills | Status: DC

## 2021-03-04 NOTE — Progress Notes (Signed)
CC: Shortness of breath  HPI: [name] is a 70 y.o. with PMH listed below presenting with complaint of shortness of breath. Please see problem based assessment and plan for further details.  Past Medical History:  Diagnosis Date  . Acute sinusitis 01/09/2019  . Anxiety   . Arthritis    "fingers, knees" (08/16/2018)  . Asthma   . Atopic dermatitis 03/20/2018  . Axillary hidradenitis suppurativa 10/13/2018  . Cancer of right breast ([place]) 1991   s/p lumpectomy, chemotherapy and radiation therapy in 1991. Mammogram in 2007 was normal.  . Constipated    h/o  . COPD (chronic obstructive pulmonary disease) ([place])    History of multiple hospital admissions for exercabation   . COPD exacerbation ([place]) 01/01/2019  . COPD with acute exacerbation ([place]) 01/14/2019  . COPD with exacerbation ([place]) 04/06/2009   Qualifier: Diagnosis of  By: Eyvonne Mechanic MD, Vijay    . Depression   . Diarrhea    h/o  . GERD (gastroesophageal reflux disease)   . Headache    "a few times/month" (08/16/2018  . Heart murmur 10/05/11   "first time I ever heard I had one was today"  . History of breast cancer 12/01/2012   Pt with h/o breast CA s/p lumpectomy with chemo/radiation in 1991. Pt mammogram 2011 was unremarkable. Had CT chest 10/2012 in ED for SOB and showed spiculated nodule with lymph node. 12/04/12: [place] 2; repeat diagnostic mammogram in 1 year.    . Hyperlipidemia   . Hypertension   . Lower extremity edema 09/21/2018  . Obesity   . Personal history of chemotherapy   . Personal history of radiation therapy   . Pneumonia    "couple times in the last 10-15 yrs" (08/16/2018)  . QT prolongation 08/08/2014  . Seasonal allergies 02/25/2017  . Shortness of breath 10/05/11   "at rest; lying down; w/exertion"  . Sigmoid diverticulitis 80/2008  . Tobacco abuse   . Type 2 diabetes mellitus ([place]) 05/14/2009  . Type II diabetes mellitus ([place])    Review of Systems: Review of Systems  Constitutional: Negative  for chills, fever and malaise/fatigue.  Eyes: Negative for blurred vision.  Respiratory: Negative for shortness of breath.   Cardiovascular: Negative for chest pain, palpitations and leg swelling.  Gastrointestinal: Negative for constipation, diarrhea, nausea and vomiting.  All other systems reviewed and are negative.    Physical Exam: Vitals:   03/04/21 0919 03/04/21 0922 03/04/21 0957  BP: (!) 151/74 (!) 156/63 (!) 148/72  Pulse: 89 82   Temp: 98.8 F (37.1 C)    TempSrc: Oral    SpO2: 100%    Weight: 179 lb (81.2 kg)    Height: 5\' 6"  (1.676 m)     Gen: Well-developed, well nourished, NAD HEENT: NCAT head, hearing intact CV: RRR, S1, S2 normal Pulm: Distant breath sounds, no rales, no wheezes Extm: ROM intact, Peripheral pulses intact, No peripheral edema Skin: Dry, Warm, normal turgor  Assessment & Plan:   Age-related osteoporosis without current pathological fracture  Agree to get screening DEXA for osteoporosis  [place] Office Visit from 03/04/2021 in [place]  PHQ-9 Total Score 3     Mentions not currently taking her 'anxiety' med. Requesting refills. Denies significant depression or anxiety but mentions she has occasional panic attacks.  - Refills sent for dulxoetine 60mg  daily  Screening for colon cancer Agreeable to be referred for colonoscopy for colon cancer screening  Diabetic neuropathy ([place]) Mentions continuing  to endorse pins-and-needles pain that is worse at night. Previously on pregabalin but has ran out. She has some gabapentin at home which she took but did not find relief.   A/P PDMP review show pregabalin last prescribed in January. Medicine bag shows bottle of gabapentin. Advised that we will send refills for her lyrica and she should not take her gabapentin. - C/w pregabalin 100mg  TID  Type 2 diabetes mellitus with complication, without long-term current use of insulin (HCC) Lab Results  Component  Value Date   HGBA1C 5.4 03/04/2021   Presenting with hx of diabetes. Currently on metformin monotherapy. Denies significant GI side effects.   A/P Well controlled. Stable - C/w metformin 1000mg  BID  Hypertension, benign essential, goal below 140/90 BP Readings from Last 3 Encounters:  03/04/21 (!) 148/72  02/23/21 132/74  12/11/20 (!) 155/75   Above goal. Reviewed the medications she brought. Has bottles of amlodipine, hctz, losartan and metoprolol but bottles appear full despite being dispensed long time ago. Mentions that she has trouble with daily adherence. Denies any chest pain, palpitations, blurry vision, light-headedness. Discussed importance of med-adherence.  - Resume amlodipine 10mg  daily, losartan 100mg  daily, hctz 25mg  daily, metoprolol tartrate 25mg  BID - DASH  Chronic obstructive pulmonary disease with bronchospasm ([place]) [name] is a 70 yo F w/ PMH of COPD presenting to [facility] for hospital follow up visit after recent hospitalization. She mentions that she has been doing well respiratory wise since discharge. Denies any additional shortness of breath or wheezing at home. She mentions that she cannot afford her regularly prescribed inhalers and requesting refills. When asked about inhalers that provided from her inpatient stay, she mentions 'using them all up.' She continues to smoke. Denies significant cough, productive sputum  A/P Present for f/u after COPD exacerbation. Treatment complicated by non-adherence to med regimen due to financial barriers. Suspect also may be rationing inhalers due to fear of running out. Does not appear to be in acute exacerbation at this visit. Unfortunately have history of no-shows to previous pulmonology referrals. Will need to address her medication needs first. - Given spiriva 1.93mcg sample - Referral to pharmacy team - Duonebs prn - Can resume breo when able to afford  Lot #  [id number] B Exp. Date 07/09/2021 Patient has been instructed  regarding the correct time, dose, and frequency of taking this medication, including its desired effects and most common side effects.    Patient discussed with [name]  -Gilberto Better, [facility] Pager: [phone]

## 2021-03-04 NOTE — Patient Instructions (Signed)
Thank you for allowing [place] to provide your care today. Today we discussed your copd    I have ordered lipid profile labs for you. I will call if any are abnormal.    Today we made no changes to your medications.    Please follow-up in 3 months.    Should you have any questions or concerns please call the internal medicine clinic at [phone].

## 2021-03-05 LAB — LIPID PANEL
Chol/HDL Ratio: 3.2 ratio (ref 0.0–4.4)
Cholesterol, Total: 196 mg/dL (ref 100–199)
HDL: 61 mg/dL (ref >39)
LDL Chol Calc (NIH): 110 mg/dL — ABNORMAL HIGH (ref 0–99)
Triglycerides: 145 mg/dL (ref 0–149)
VLDL Cholesterol Cal: 25 mg/dL (ref 5–40)

## 2021-03-06 ENCOUNTER — Encounter: Payer: Self-pay | Admitting: Internal Medicine

## 2021-03-06 ENCOUNTER — Other Ambulatory Visit: Payer: Self-pay

## 2021-03-06 ENCOUNTER — Telehealth: Payer: Self-pay

## 2021-03-06 DIAGNOSIS — Z1211 Encounter for screening for malignant neoplasm of colon: Secondary | ICD-10-CM | POA: Insufficient documentation

## 2021-03-06 DIAGNOSIS — Z0000 Encounter for general adult medical examination without abnormal findings: Secondary | ICD-10-CM | POA: Insufficient documentation

## 2021-03-06 DIAGNOSIS — M810 Age-related osteoporosis without current pathological fracture: Secondary | ICD-10-CM | POA: Insufficient documentation

## 2021-03-06 MED ORDER — IPRATROPIUM-ALBUTEROL 0.5-2.5 (3) MG/3ML IN SOLN: 3.0000 mL | Freq: Four times a day (QID) | RESPIRATORY_TRACT | 1 refills | Status: DC | PRN

## 2021-03-06 NOTE — Assessment & Plan Note (Signed)
[place] Office Visit from 03/04/2021 in [place]  PHQ-9 Total Score 3     Mentions not currently taking her 'anxiety' med. Requesting refills. Denies significant depression or anxiety but mentions she has occasional panic attacks.  - Refills sent for dulxoetine 60mg  daily

## 2021-03-06 NOTE — Assessment & Plan Note (Signed)
Lab Results  Component Value Date   HGBA1C 5.4 03/04/2021   Presenting with hx of diabetes. Currently on metformin monotherapy. Denies significant GI side effects.   A/P Well controlled. Stable - C/w metformin 1000mg  BID

## 2021-03-06 NOTE — Assessment & Plan Note (Signed)
Agreeable to be referred for colonoscopy for colon cancer screening

## 2021-03-06 NOTE — Assessment & Plan Note (Signed)
Mentions continuing to endorse pins-and-needles pain that is worse at night. Previously on pregabalin but has ran out. She has some gabapentin at home which she took but did not find relief.   A/P PDMP review show pregabalin last prescribed in January. Medicine bag shows bottle of gabapentin. Advised that we will send refills for her lyrica and she should not take her gabapentin. - C/w pregabalin 100mg  TID

## 2021-03-06 NOTE — Assessment & Plan Note (Addendum)
[name] is a 70 yo F w/ PMH of COPD presenting to [facility] for hospital follow up visit after recent hospitalization. She mentions that she has been doing well respiratory wise since discharge. Denies any additional shortness of breath or wheezing at home. She mentions that she cannot afford her regularly prescribed inhalers and requesting refills. When asked about inhalers that provided from her inpatient stay, she mentions 'using them all up.' She continues to smoke. Denies significant cough, productive sputum  A/P Present for f/u after COPD exacerbation. Treatment complicated by non-adherence to med regimen due to financial barriers. Suspect also may be rationing inhalers due to fear of running out. Does not appear to be in acute exacerbation at this visit. Unfortunately have history of no-shows to previous pulmonology referrals. Will need to address her medication needs first. - Given spiriva 1.38mcg sample - Referral to pharmacy team - Duonebs prn - Can resume breo when able to afford  Lot #  [id number] B Exp. Date 07/09/2021 Patient has been instructed regarding the correct time, dose, and frequency of taking this medication, including its desired effects and most common side effects.

## 2021-03-06 NOTE — Assessment & Plan Note (Signed)
BP Readings from Last 3 Encounters:  03/04/21 (!) 148/72  02/23/21 132/74  12/11/20 (!) 155/75   Above goal. Reviewed the medications she brought. Has bottles of amlodipine, hctz, losartan and metoprolol but bottles appear full despite being dispensed long time ago. Mentions that she has trouble with daily adherence. Denies any chest pain, palpitations, blurry vision, light-headedness. Discussed importance of med-adherence.  - Resume amlodipine 10mg  daily, losartan 100mg  daily, hctz 25mg  daily, metoprolol tartrate 25mg  BID - DASH

## 2021-03-06 NOTE — Assessment & Plan Note (Signed)
Agree to get screening DEXA for osteoporosis

## 2021-03-06 NOTE — Assessment & Plan Note (Signed)
>>  ASSESSMENT AND PLAN FOR CHRONIC OBSTRUCTIVE PULMONARY DISEASE WITH BRONCHOSPASM ([place]) WRITTEN ON 03/06/2021 11:19 AM BY LEE, Dimple Nanas, MD  Ms.Lieurance is a 70 yo F w/ PMH of COPD presenting to [facility] for hospital follow up visit after recent hospitalization. She mentions that she has been doing well respiratory wise since discharge. Denies any additional shortness of breath or wheezing at home. She mentions that she cannot afford her regularly prescribed inhalers and requesting refills. When asked about inhalers that provided from her inpatient stay, she mentions 'using them all up.' She continues to smoke. Denies significant cough, productive sputum  A/P Present for f/u after COPD exacerbation. Treatment complicated by non-adherence to med regimen due to financial barriers. Suspect also may be rationing inhalers due to fear of running out. Does not appear to be in acute exacerbation at this visit. Unfortunately have history of no-shows to previous pulmonology referrals. Will need to address her medication needs first. - Given spiriva 1.35mcg sample - Referral to pharmacy team - Duonebs prn - Can resume breo when able to afford  Lot #  [id number] B Exp. Date 07/09/2021 Patient has been instructed regarding the correct time, dose, and frequency of taking this medication, including its desired effects and most common side effects.

## 2021-03-06 NOTE — Telephone Encounter (Signed)
Left message with patient asking for call back regarding possible patient assistance.  Call back # [phone]

## 2021-03-12 NOTE — Progress Notes (Signed)
Internal Medicine Clinic Attending  Case discussed with Dr. Lee  At the time of the visit.  We reviewed the resident's history and exam and pertinent patient test results.  I agree with the assessment, diagnosis, and plan of care documented in the resident's note.    

## 2021-03-13 DIAGNOSIS — Z20822 Contact with and (suspected) exposure to covid-19: Secondary | ICD-10-CM | POA: Diagnosis not present

## 2021-03-13 DIAGNOSIS — Z23 Encounter for immunization: Secondary | ICD-10-CM | POA: Diagnosis not present

## 2021-03-28 ENCOUNTER — Encounter: Payer: Self-pay | Admitting: *Deleted

## 2021-04-01 ENCOUNTER — Other Ambulatory Visit: Payer: Self-pay

## 2021-04-01 MED ORDER — METFORMIN HCL 1000 MG PO TABS: 1000.0000 mg | ORAL_TABLET | Freq: Two times a day (BID) | ORAL | 3 refills | Status: DC

## 2021-05-05 ENCOUNTER — Other Ambulatory Visit: Payer: Self-pay | Admitting: *Deleted

## 2021-05-05 DIAGNOSIS — I10 Essential (primary) hypertension: Secondary | ICD-10-CM

## 2021-05-05 MED ORDER — HYDROCHLOROTHIAZIDE 25 MG PO TABS: 25.0000 mg | ORAL_TABLET | Freq: Every day | ORAL | 3 refills | Status: DC

## 2021-05-05 MED ORDER — METOPROLOL TARTRATE 25 MG PO TABS: 25.0000 mg | ORAL_TABLET | Freq: Two times a day (BID) | ORAL | 3 refills | Status: DC

## 2021-05-12 ENCOUNTER — Encounter: Payer: Self-pay | Admitting: *Deleted

## 2021-05-26 ENCOUNTER — Telehealth: Payer: Self-pay | Admitting: Internal Medicine

## 2021-05-27 ENCOUNTER — Encounter: Payer: Self-pay | Admitting: *Deleted

## 2021-06-03 ENCOUNTER — Ambulatory Visit (INDEPENDENT_AMBULATORY_CARE_PROVIDER_SITE_OTHER): Payer: Medicare Other | Admitting: Internal Medicine

## 2021-06-03 DIAGNOSIS — F419 Anxiety disorder, unspecified: Secondary | ICD-10-CM | POA: Diagnosis not present

## 2021-06-03 DIAGNOSIS — I10 Essential (primary) hypertension: Secondary | ICD-10-CM | POA: Diagnosis not present

## 2021-06-03 DIAGNOSIS — Z1211 Encounter for screening for malignant neoplasm of colon: Secondary | ICD-10-CM

## 2021-06-03 DIAGNOSIS — J449 Chronic obstructive pulmonary disease, unspecified: Secondary | ICD-10-CM

## 2021-06-03 DIAGNOSIS — J441 Chronic obstructive pulmonary disease with (acute) exacerbation: Secondary | ICD-10-CM

## 2021-06-03 DIAGNOSIS — E119 Type 2 diabetes mellitus without complications: Secondary | ICD-10-CM

## 2021-06-03 DIAGNOSIS — E1142 Type 2 diabetes mellitus with diabetic polyneuropathy: Secondary | ICD-10-CM | POA: Diagnosis not present

## 2021-06-03 MED ORDER — LOSARTAN POTASSIUM 100 MG PO TABS: 100.0000 mg | ORAL_TABLET | Freq: Every day | ORAL | 3 refills | Status: DC

## 2021-06-03 MED ORDER — PREGABALIN 100 MG PO CAPS: 100.0000 mg | ORAL_CAPSULE | Freq: Three times a day (TID) | ORAL | 0 refills | Status: DC

## 2021-06-03 MED ORDER — DULOXETINE HCL 60 MG PO CPEP: 60.0000 mg | ORAL_CAPSULE | Freq: Every day | ORAL | 3 refills | Status: DC

## 2021-06-03 MED ORDER — AMLODIPINE BESYLATE 10 MG PO TABS: 10.0000 mg | ORAL_TABLET | Freq: Every day | ORAL | 3 refills | Status: DC

## 2021-06-03 MED ORDER — SIMVASTATIN 40 MG PO TABS: 40.0000 mg | ORAL_TABLET | Freq: Every evening | ORAL | 3 refills | Status: DC

## 2021-06-04 ENCOUNTER — Other Ambulatory Visit: Payer: Self-pay

## 2021-06-04 DIAGNOSIS — J449 Chronic obstructive pulmonary disease, unspecified: Secondary | ICD-10-CM

## 2021-06-04 NOTE — Assessment & Plan Note (Signed)
>>  ASSESSMENT AND PLAN FOR CHRONIC OBSTRUCTIVE PULMONARY DISEASE WITH BRONCHOSPASM ([place]) WRITTEN ON 06/05/2021  2:37 PM BY Jose Persia, MD  [name] states that she has run out of her albuterol and Spiriva inhalers that she was given as samples here in the clinic.  She is requesting a refill be sent to the pharmacy of inhalers that she would be able to afford.  Assessment/plan: Unfortunately patient's inhalers are unaffordable for her as her Medicare does not cover them.  Per chart review, patient was previously recommended to be on triple therapy.  At the last appointment, she was given a sample of Spiriva (LAMA), however she is prescribed Breo as well (ICS/LABA).  We do have a sample of Stiolto here in the office, however she will need a long-term solution to obtain her medications.  - We will attempt to contact patient regarding Stiolto sample - Referral to pharmacy  ADDENDUM:  Ms. Raimondo will reach out to [facility] to find out the price of Albuterol. If she cannot afford it, she will reach out to [facility] and let us know. In that case, may need to resort to [facility] outpatient pharmacy if possible. It is imperative she have Albuterol on her at all times. In regards to ICS/LAMA/LABA, will work with Dr. Georgina Peer and Reeves Forth to find an affordable option.

## 2021-06-04 NOTE — Assessment & Plan Note (Signed)
-   Refilled duloxetine 60 mg

## 2021-06-04 NOTE — Telephone Encounter (Signed)
albuterol (PROVENTIL HFA) 108 (90 Base) MCG/ACT inhaler  Tiotropium Bromide Monohydrate (SPIRIVA RESPIMAT) 2.5 MCG/ACT AERS, refill request @ [facility] [phone] - [place], [address] AT [address].

## 2021-06-04 NOTE — Assessment & Plan Note (Signed)
Patient requesting a refill on her amlodipine and losartan.  Denies any difficulty with her medications.  - Amlodipine 10 mg daily and losartan 100 mg daily refill sent to pharmacy

## 2021-06-04 NOTE — Assessment & Plan Note (Addendum)
Ms. Terpening states that she has run out of her albuterol and Spiriva inhalers that she was given as samples here in the clinic.  She is requesting a refill be sent to the pharmacy of inhalers that she would be able to afford.  Assessment/plan: Unfortunately patient's inhalers are unaffordable for her as her Medicare does not cover them.  Per chart review, patient was previously recommended to be on triple therapy.  At the last appointment, she was given a sample of Spiriva (LAMA), however she is prescribed Breo as well (ICS/LABA).  We do have a sample of Stiolto here in the office, however she will need a long-term solution to obtain her medications.  - We will attempt to contact patient regarding Stiolto sample - Referral to pharmacy  ADDENDUM:  Ms. Monier will reach out to [facility] to find out the price of Albuterol. If she cannot afford it, she will reach out to [facility] and let us know. In that case, may need to resort to [facility] outpatient pharmacy if possible. It is imperative she have Albuterol on her at all times. In regards to ICS/LAMA/LABA, will work with Dr. Georgina Peer and Reeves Forth to find an affordable option.

## 2021-06-04 NOTE — Assessment & Plan Note (Addendum)
[name] states that her diabetic neuropathy has gotten so severe that it has dramatically decreased her quality of life.  She states that she is taking pregabalin 100 mg 3 times a day.  I checked PDMP while on the phone with [name] which states the last prescription was picked up in April for only 60 tablets.  I discussed this with her and stated that it would not be possible for her to be taking the medications as she states.  She notes that she does miss a tablet here and there.  She is requesting an increase in the dose of her pregabalin.  She notes that the pain is so severe she has considered taking her "sleeping medications just to make it go away."  Assessment/plan: Counseled [name] to not take sleeping medications and response to neuropathy.  We discussed that pregabalin works best when taken as prescribed with frequent dosing throughout the day.  I encouraged that she pick up the refill I am sending today and do a 2-week trial of taking it 3 times daily as prescribed.  If pain control is not adequate at that time we can consider an increase to 150 mg.  I will also place a referral to pharmacy to assist [name] with medication adherence.  - Pregabalin 100 mg 3 times daily sent to pharmacy, #90 tablets - Duloxetine 60 mg sent to pharmacy

## 2021-06-04 NOTE — Progress Notes (Signed)
  [facility] Internal Medicine Residency Telephone Encounter Continuity Care Appointment  HPI:  This telephone encounter was created for [name] on 06/04/2021 for the following purpose/cc medication refills.   Past Medical History:  Past Medical History:  Diagnosis Date   Acute sinusitis 01/09/2019   Anxiety    Arthritis    "fingers, knees" (08/16/2018)   Asthma    Atopic dermatitis 03/20/2018   Axillary hidradenitis suppurativa 10/13/2018   Cancer of right breast ([place]) 1991   s/p lumpectomy, chemotherapy and radiation therapy in 1991. Mammogram in 2007 was normal.   Constipated    h/o   COPD (chronic obstructive pulmonary disease) (HCC)    History of multiple hospital admissions for exercabation    COPD exacerbation ([place]) 01/01/2019   COPD with acute exacerbation ([place]) 01/14/2019   COPD with exacerbation ([place]) 04/06/2009   Qualifier: Diagnosis of  By: Eyvonne Mechanic MD, Vijay     Depression    Diarrhea    h/o   GERD (gastroesophageal reflux disease)    Headache    "a few times/month" (08/16/2018   Heart murmur 10/05/11   "first time I ever heard I had one was today"   History of breast cancer 12/01/2012   Pt with h/o breast CA s/p lumpectomy with chemo/radiation in 1991. Pt mammogram 2011 was unremarkable. Had CT chest 10/2012 in ED for SOB and showed spiculated nodule with lymph node. 12/04/12: Birads 2; repeat diagnostic mammogram in 1 year.     Hyperlipidemia    Hypertension    Lower extremity edema 09/21/2018   Obesity    Personal history of chemotherapy    Personal history of radiation therapy    Pneumonia    "couple times in the last 10-15 yrs" (08/16/2018)   QT prolongation 08/08/2014   Seasonal allergies 02/25/2017   Shortness of breath 10/05/11   "at rest; lying down; w/exertion"   Sigmoid diverticulitis 80/2008   Tobacco abuse    Type 2 diabetes mellitus ([place]) 05/14/2009   Type II diabetes mellitus ([place])      ROS:     Assessment / Plan / Recommendations:   Please see A&P under problem oriented charting for assessment of the patient's acute and chronic medical conditions.  As always, pt is advised that if symptoms worsen or new symptoms arise, they should go to an urgent care facility or to to ER for further evaluation.   Consent and Medical Decision Making:  Patient discussed with Dr. Jimmye Norman This is a telephone encounter between [name] and Jose Persia on 06/04/2021 for medication refills. The visit was conducted with the patient located at home and Jose Persia at [facility]. The patient's identity was confirmed using their DOB and current address. The patient has consented to being evaluated through a telephone encounter and understands the associated risks (an examination cannot be done and the patient may need to come in for an appointment) / benefits (allows the patient to remain at home, decreasing exposure to coronavirus). I personally spent 20 minutes on medical discussion.

## 2021-06-04 NOTE — Assessment & Plan Note (Signed)
Discussed with Ms. Leifheit that we have been unable to reach her to schedule colonoscopy.  She notes that she did receive a letter in the mail regarding this but cannot find it.  The number for [facility] was provided to patient and I encouraged that she contact them to make an appointment for her colonoscopy.

## 2021-06-05 ENCOUNTER — Other Ambulatory Visit: Payer: Self-pay | Admitting: Internal Medicine

## 2021-06-05 DIAGNOSIS — J449 Chronic obstructive pulmonary disease, unspecified: Secondary | ICD-10-CM

## 2021-06-05 MED ORDER — SPIRIVA RESPIMAT 2.5 MCG/ACT IN AERS: 2.0000 | INHALATION_SPRAY | Freq: Every day | RESPIRATORY_TRACT | 11 refills | Status: DC

## 2021-06-05 MED ORDER — ALBUTEROL SULFATE HFA 108 (90 BASE) MCG/ACT IN AERS: 1.0000 | INHALATION_SPRAY | Freq: Four times a day (QID) | RESPIRATORY_TRACT | 3 refills | Status: DC | PRN

## 2021-06-07 NOTE — Progress Notes (Signed)
Internal Medicine Clinic Attending  Case discussed with Dr. Basaraba  At the time of the visit.  We reviewed the resident's history and exam and pertinent patient test results.  I agree with the assessment, diagnosis, and plan of care documented in the resident's note.  

## 2021-06-17 ENCOUNTER — Telehealth: Payer: Self-pay

## 2021-06-17 NOTE — Telephone Encounter (Signed)
Pt interested in PAP for trelegy inhaler. Pt agreed to have application mailed to her home & will return to office once complete.

## 2021-06-25 ENCOUNTER — Other Ambulatory Visit: Payer: Self-pay | Admitting: Internal Medicine

## 2021-06-25 MED ORDER — TRELEGY ELLIPTA 100-62.5-25 MCG/INH IN AEPB: 1.0000 | INHALATION_SPRAY | Freq: Every day | RESPIRATORY_TRACT | 2 refills | Status: DC

## 2021-07-01 ENCOUNTER — Telehealth: Payer: Self-pay | Admitting: Internal Medicine

## 2021-07-01 ENCOUNTER — Ambulatory Visit (INDEPENDENT_AMBULATORY_CARE_PROVIDER_SITE_OTHER): Payer: Medicare Other | Admitting: Internal Medicine

## 2021-07-01 DIAGNOSIS — J449 Chronic obstructive pulmonary disease, unspecified: Secondary | ICD-10-CM

## 2021-07-01 MED ORDER — PREDNISONE 20 MG PO TABS: 40.0000 mg | ORAL_TABLET | Freq: Every day | ORAL | 0 refills | Status: AC

## 2021-07-01 MED ORDER — AZITHROMYCIN 250 MG PO TABS: ORAL_TABLET | ORAL | 0 refills | Status: AC

## 2021-07-01 NOTE — Progress Notes (Signed)
  [facility] Internal Medicine Residency Telephone Encounter Continuity Care Appointment  HPI:  This telephone encounter was created for [name] on 07/01/2021 for the following purpose/cc COPD exacerbation.     Past Medical History:  Past Medical History:  Diagnosis Date   Acute sinusitis 01/09/2019   Anxiety    Arthritis    "fingers, knees" (08/16/2018)   Asthma    Atopic dermatitis 03/20/2018   Axillary hidradenitis suppurativa 10/13/2018   Cancer of right breast ([place]) 1991   s/p lumpectomy, chemotherapy and radiation therapy in 1991. Mammogram in 2007 was normal.   Constipated    h/o   COPD (chronic obstructive pulmonary disease) (HCC)    History of multiple hospital admissions for exercabation    COPD exacerbation ([place]) 01/01/2019   COPD with acute exacerbation ([place]) 01/14/2019   COPD with exacerbation ([place]) 04/06/2009   Qualifier: Diagnosis of  By: Eyvonne Mechanic MD, Vijay     Depression    Diarrhea    h/o   GERD (gastroesophageal reflux disease)    Headache    "a few times/month" (08/16/2018   Heart murmur 10/05/11   "first time I ever heard I had one was today"   History of breast cancer 12/01/2012   Pt with h/o breast CA s/p lumpectomy with chemo/radiation in 1991. Pt mammogram 2011 was unremarkable. Had CT chest 10/2012 in ED for SOB and showed spiculated nodule with lymph node. 12/04/12: Birads 2; repeat diagnostic mammogram in 1 year.     Hyperlipidemia    Hypertension    Lower extremity edema 09/21/2018   Obesity    Personal history of chemotherapy    Personal history of radiation therapy    Pneumonia    "couple times in the last 10-15 yrs" (08/16/2018)   QT prolongation 08/08/2014   Seasonal allergies 02/25/2017   Shortness of breath 10/05/11   "at rest; lying down; w/exertion"   Sigmoid diverticulitis 80/2008   Tobacco abuse    Type 2 diabetes mellitus ([place]) 05/14/2009   Type II diabetes mellitus ([place])      ROS:  Review of Systems  Constitutional:   Negative for chills and fever.  HENT:  Positive for congestion and sinus pain.   Respiratory:  Positive for cough, sputum production and shortness of breath.      Assessment / Plan / Recommendations:  Please see A&P under problem oriented charting for assessment of the patient's acute and chronic medical conditions.  As always, pt is advised that if symptoms worsen or new symptoms arise, they should go to an urgent care facility or to to ER for further evaluation.   Consent and Medical Decision Making:  Patient discussed with Dr. Dareen Piano This is a telephone encounter between [name] and Lorene Dy on 07/01/2021 for COPD exacerbation. The visit was conducted with the patient located at home and Lorene Dy at [facility]. The patient's identity was confirmed using their DOB and current address. The patient has consented to being evaluated through a telephone encounter and understands the associated risks (an examination cannot be done and the patient may need to come in for an appointment) / benefits (allows the patient to remain at home, decreasing exposure to coronavirus). I personally spent 20 minutes on medical discussion.

## 2021-07-01 NOTE — Telephone Encounter (Signed)
Please call patient back.  Pt coughing, wheezing, and congested x 3 days. No recent Covid Test performed.

## 2021-07-01 NOTE — Telephone Encounter (Signed)
RTC to patient, she c/o Sinus drainage into her throat w/ nasal congestion.  Productive Cough w/ clear sputum and mild intermittent wheezing which she is using her albuterol  for.  She denies any SOB.  She is fully vaccinated.  She has a hx of COPD and states it feels like a COPD flare coming on.  Telehealth appt given for today/ yellow team Dr. Court Joy @ 3:45.  She states she has a home Covid test and she was instructed to take the test before her telehealth appt today. SChaplin, RN,BSN

## 2021-07-02 NOTE — Assessment & Plan Note (Addendum)
HPI: Patient is currently having congestion and sinus pain. This started Sunday. She has productive cough, increased dyspnea, and rhinorrhea. This feels similar to previous exacerbations. No one else around her has been sick. No fever. Home Covid negative. She has received paperwork and needs to fill it out to receive inhaler.   Assessment/Plan: Exacerbation of chronic obstructive pulmonary - azithromycin (ZITHROMAX Z-PAK) 250 MG tablet; Take 2 tablets (500 mg) on  Day 1,  followed by 1 tablet (250 mg) once daily on Days 2 through 5.  Dispense: 6 each; Refill: 0 - predniSONE (DELTASONE) 20 MG tablet; Take 2 tablets (40 mg total) by mouth daily with breakfast for 5 days.  Dispense: 10 tablet; Refill: 0

## 2021-07-02 NOTE — Assessment & Plan Note (Signed)
>>  ASSESSMENT AND PLAN FOR CHRONIC OBSTRUCTIVE PULMONARY DISEASE WITH BRONCHOSPASM ([place]) WRITTEN ON 07/04/2021 10:11 AM BY Madalyn Rob, MD  HPI: Patient is currently having congestion and sinus pain. This started Sunday. She has productive cough, increased dyspnea, and rhinorrhea. This feels similar to previous exacerbations. No one else around her has been sick. No fever. Home Covid negative. She has received paperwork and needs to fill it out to receive inhaler.   Assessment/Plan: Exacerbation of chronic obstructive pulmonary - azithromycin (ZITHROMAX Z-PAK) 250 MG tablet; Take 2 tablets (500 mg) on  Day 1,  followed by 1 tablet (250 mg) once daily on Days 2 through 5.  Dispense: 6 each; Refill: 0 - predniSONE (DELTASONE) 20 MG tablet; Take 2 tablets (40 mg total) by mouth daily with breakfast for 5 days.  Dispense: 10 tablet; Refill: 0

## 2021-07-04 ENCOUNTER — Encounter: Payer: Self-pay | Admitting: Internal Medicine

## 2021-07-06 NOTE — Progress Notes (Signed)
Internal Medicine Clinic Attending  Case discussed with Dr. Steen  At the time of the visit.  We reviewed the resident's history and exam and pertinent patient test results.  I agree with the assessment, diagnosis, and plan of care documented in the resident's note.  

## 2021-07-09 ENCOUNTER — Telehealth: Payer: Self-pay

## 2021-07-09 ENCOUNTER — Encounter: Payer: Medicare Other | Admitting: Internal Medicine

## 2021-07-09 NOTE — Telephone Encounter (Signed)
Pt had a telehealth on 8/24 w/ Dr. Court Joy for COPD exacerbation and was given ABX and prednisone w/ instructions to return for clinic appt if symptoms don't improve or worsen.  TC to patient and self identified VM obtained.  Message left reiterating Dr. Lonzo Candy instructions.  [facility] has one appt open today w/ red team, no available appt's tomorrow.  Appt made for 1:45 today/red team/Dr. Gilford Rile.  Pt instructed to call [facility] back if she cannot make this appt. SChaplin, RN,BSN

## 2021-07-09 NOTE — Telephone Encounter (Signed)
Pt is requesting a call back she stated that even though she completed all the medication  that she was given from her telehealth visit she is still not feeling well

## 2021-07-10 ENCOUNTER — Ambulatory Visit (INDEPENDENT_AMBULATORY_CARE_PROVIDER_SITE_OTHER): Payer: Medicare Other | Admitting: Internal Medicine

## 2021-07-10 ENCOUNTER — Telehealth: Payer: Self-pay

## 2021-07-10 DIAGNOSIS — J329 Chronic sinusitis, unspecified: Secondary | ICD-10-CM

## 2021-07-10 NOTE — Assessment & Plan Note (Signed)
Overall, it sounds like her symptoms are improving.  She is encouraged to try alternative therapy such as Mucinex if NyQuil and DayQuil are not providing sufficient relief.  She does not have a pulse oximeter at home however feels like her breathing is getting better. She has declined an in-person evaluation.  I recommended that she seek urgent evaluation should her respiratory symptoms worsen over the weekend which she was agreeable to.  I would like for her to be seen in the office next week for an in person evaluation. I do not think that antibiotics are indicated at this time as I suspect that most of her symptoms are postviral and should improve over the next week.

## 2021-07-10 NOTE — Progress Notes (Signed)
[facility] Internal Medicine Residency Telephone Encounter Continuity Care Appointment  CC: persistent cough and sinus congestion   HPI:  This telephone encounter was created for [name] on 07/10/2021 for persistent cough and sinus congestion. She is initially diagnosed with a COPD exacerbation and treated accordingly with prednisone and azithromycin on 8/24 when seen in our office.   Since then, she notes that symptoms had initially worsened slightly however have now improved over the past 24 to 48 hours.  She continues to experience some shortness of breath however reports that this is improved.  She denies any dizziness or lightheadedness with ambulation. She continues to endorse a productive cough but feels like this is also improved since her last visit.  She has been using NyQuil and DayQuil which is been providing some relief. Her primary complaint is sinus congestion which is slightly improved from yesterday. She denies chest pain, fevers, chills, nausea, vomiting.   Past Medical History:  Past Medical History:  Diagnosis Date   Acute sinusitis 01/09/2019   Anxiety    Arthritis    "fingers, knees" (08/16/2018)   Asthma    Atopic dermatitis 03/20/2018   Axillary hidradenitis suppurativa 10/13/2018   Cancer of right breast ([place]) 1991   s/p lumpectomy, chemotherapy and radiation therapy in 1991. Mammogram in 2007 was normal.   Constipated    h/o   COPD (chronic obstructive pulmonary disease) (HCC)    History of multiple hospital admissions for exercabation    COPD exacerbation ([place]) 01/01/2019   COPD with acute exacerbation ([place]) 01/14/2019   COPD with exacerbation ([place]) 04/06/2009   Qualifier: Diagnosis of  By: Eyvonne Mechanic MD, Vijay     Depression    Diarrhea    h/o   GERD (gastroesophageal reflux disease)    Headache    "a few times/month" (08/16/2018   Heart murmur 10/05/11   "first time I ever heard I had one was today"   History of breast cancer 12/01/2012   Pt  with h/o breast CA s/p lumpectomy with chemo/radiation in 1991. Pt mammogram 2011 was unremarkable. Had CT chest 10/2012 in ED for SOB and showed spiculated nodule with lymph node. 12/04/12: Birads 2; repeat diagnostic mammogram in 1 year.     Hyperlipidemia    Hypertension    Lower extremity edema 09/21/2018   Obesity    Personal history of chemotherapy    Personal history of radiation therapy    Pneumonia    "couple times in the last 10-15 yrs" (08/16/2018)   QT prolongation 08/08/2014   Seasonal allergies 02/25/2017   Shortness of breath 10/05/11   "at rest; lying down; w/exertion"   Sigmoid diverticulitis 80/2008   Tobacco abuse    Type 2 diabetes mellitus ([place]) 05/14/2009   Type II diabetes mellitus ([place])       Assessment / Plan / Recommendations:  Post viral syndrome, viral sinusitis Overall, it sounds like her symptoms are improving.  She is encouraged to try alternative therapy such as Mucinex if NyQuil and DayQuil are not providing sufficient relief.  She does not have a pulse oximeter at home however feels like her breathing is getting better. She has declined an in-person evaluation.  I recommended that she seek urgent evaluation should her respiratory symptoms worsen over the weekend which she was agreeable to.  I would like for her to be seen in the office next week for an in person evaluation. I do not think that antibiotics are indicated at this time as  I suspect that most of her symptoms are postviral and should improve over the next week.    As always, pt is advised that if symptoms worsen or new symptoms arise, they should go to an urgent care facility or to to ER for further evaluation.   Consent and Medical Decision Making:  Patient discussed with Dr. Angelia Mould This is a telephone encounter between [name] and [place] on 07/10/2021 for sinus congestion. The visit was conducted with the patient located at home and [employer] at [facility]. The patient's  identity was confirmed using their DOB and current address. The patient has consented to being evaluated through a telephone encounter and understands the associated risks (an examination cannot be done and the patient may need to come in for an appointment) / benefits (allows the patient to remain at home, decreasing exposure to coronavirus). I personally spent 25 minutes on medical discussion.

## 2021-07-10 NOTE — Telephone Encounter (Signed)
Added to Friday pm Telehealth schedule at 1345 per Dr. Jodene Nam instructions.  Pt notified. SChaplin, RN,BSN

## 2021-07-10 NOTE — Telephone Encounter (Signed)
(  Please see message below).  Pt was a n/s for appt yesterday afternoon. TC to patient, she states she went to "lay down on her bed yesterday afternoon to rest. She states "I am still coughing, but not as bad.  I am producing clear mucous, have a lot of head/sinus congestion.  I feel rattles in my upper chest and still have some SOB which I am using my nebulizers for.   There are no open appt's today.  Pt was instructed to present to urgent care for evaluation.  She states she is not going to urgent care or ED, she wants more ABX and/or prednisone.  She is requesting an appt to be seen next week, no openings till the end of next week.   Will forward to yellow team and attending pool to advise. SChaplin, RN,BSN

## 2021-07-23 DIAGNOSIS — Z23 Encounter for immunization: Secondary | ICD-10-CM | POA: Diagnosis not present

## 2021-08-20 ENCOUNTER — Other Ambulatory Visit: Payer: Self-pay

## 2021-08-20 DIAGNOSIS — E1142 Type 2 diabetes mellitus with diabetic polyneuropathy: Secondary | ICD-10-CM

## 2021-08-20 MED ORDER — PREGABALIN 100 MG PO CAPS: 100.0000 mg | ORAL_CAPSULE | Freq: Three times a day (TID) | ORAL | 0 refills | Status: DC

## 2021-09-13 DIAGNOSIS — Z20828 Contact with and (suspected) exposure to other viral communicable diseases: Secondary | ICD-10-CM | POA: Diagnosis not present

## 2021-09-25 ENCOUNTER — Ambulatory Visit (INDEPENDENT_AMBULATORY_CARE_PROVIDER_SITE_OTHER): Payer: Medicare Other | Admitting: Internal Medicine

## 2021-09-25 ENCOUNTER — Other Ambulatory Visit: Payer: Self-pay

## 2021-09-25 DIAGNOSIS — M542 Cervicalgia: Secondary | ICD-10-CM | POA: Insufficient documentation

## 2021-09-25 DIAGNOSIS — F411 Generalized anxiety disorder: Secondary | ICD-10-CM

## 2021-09-25 DIAGNOSIS — M62838 Other muscle spasm: Secondary | ICD-10-CM

## 2021-09-25 MED ORDER — MIRTAZAPINE 15 MG PO TABS: 15.0000 mg | ORAL_TABLET | Freq: Every day | ORAL | 0 refills | Status: DC

## 2021-09-25 MED ORDER — MELOXICAM 7.5 MG PO TABS: 7.5000 mg | ORAL_TABLET | Freq: Every day | ORAL | 0 refills | Status: AC

## 2021-09-25 NOTE — Progress Notes (Signed)
  [facility] Internal Medicine Residency Telephone Encounter Continuity Care Appointment  HPI:  This telephone encounter was created for [name] on 09/25/2021 for the following purpose/cc neck pain. Mrs. South describes three days of left-sided neck pain that radiates from the hair line to the left shoulder. She is having difficulty with moving her neck around. She denies any focal weakness though, nor paresthesias. She believes it started after laying on a pillow wrong. For pain control, she has tried massage and OTC cortisol cream.    Past Medical History:  Past Medical History:  Diagnosis Date   Acute sinusitis 01/09/2019   Anxiety    Arthritis    "fingers, knees" (08/16/2018)   Asthma    Atopic dermatitis 03/20/2018   Axillary hidradenitis suppurativa 10/13/2018   Cancer of right breast ([place]) 1991   s/p lumpectomy, chemotherapy and radiation therapy in 1991. Mammogram in 2007 was normal.   Constipated    h/o   COPD (chronic obstructive pulmonary disease) (HCC)    History of multiple hospital admissions for exercabation    COPD exacerbation ([place]) 01/01/2019   COPD with acute exacerbation ([place]) 01/14/2019   COPD with exacerbation ([place]) 04/06/2009   Qualifier: Diagnosis of  By: Eyvonne Mechanic MD, Vijay     Depression    Diarrhea    h/o   GERD (gastroesophageal reflux disease)    Headache    "a few times/month" (08/16/2018   Heart murmur 10/05/11   "first time I ever heard I had one was today"   History of breast cancer 12/01/2012   Pt with h/o breast CA s/p lumpectomy with chemo/radiation in 1991. Pt mammogram 2011 was unremarkable. Had CT chest 10/2012 in ED for SOB and showed spiculated nodule with lymph node. 12/04/12: Birads 2; repeat diagnostic mammogram in 1 year.     Hyperlipidemia    Hypertension    Lower extremity edema 09/21/2018   Obesity    Personal history of chemotherapy    Personal history of radiation therapy    Pneumonia    "couple times in the last 10-15  yrs" (08/16/2018)   QT prolongation 08/08/2014   Seasonal allergies 02/25/2017   Shortness of breath 10/05/11   "at rest; lying down; w/exertion"   Sigmoid diverticulitis 80/2008   Tobacco abuse    Type 2 diabetes mellitus ([place]) 05/14/2009   Type II diabetes mellitus ([place])      ROS:  Please see above for details.   Assessment / Plan / Recommendations:  Please see A&P under problem oriented charting for assessment of the patient's acute and chronic medical conditions.  As always, pt is advised that if symptoms worsen or new symptoms arise, they should go to an urgent care facility or to to ER for further evaluation.   Consent and Medical Decision Making:  Patient discussed with Dr.  Saverio Danker This is a telephone encounter between [name] and Jose Persia on 09/25/2021 for neck pain. The visit was conducted with the patient located at home and Jose Persia at [facility]. The patient's identity was confirmed using their DOB and current address. The patient has consented to being evaluated through a telephone encounter and understands the associated risks (an examination cannot be done and the patient may need to come in for an appointment) / benefits (allows the patient to remain at home, decreasing exposure to coronavirus). I personally spent 10 minutes on medical discussion.

## 2021-09-25 NOTE — Assessment & Plan Note (Signed)
History consistent with muscle spasm, likely trapezius.  I am unable to perform an examination as appointment is via telephone.  Given she has failed conservative management with heat massage, will do a short trial of NSAIDs.  I would like to avoid muscle relaxers given her age and risk of polypharmacy.  - Meloxicam 7.5 mg daily for 5 days - Stretching recommended - Continue massage and heat - Follow-up if no resolve

## 2021-09-25 NOTE — Assessment & Plan Note (Signed)
>>  ASSESSMENT AND PLAN FOR GAD (GENERALIZED ANXIETY DISORDER) WRITTEN ON 09/25/2021 11:56 AM BY Jose Persia, MD  - Patient requesting a refill on mirtazapine.  Refill sent to pharmacy

## 2021-09-25 NOTE — Assessment & Plan Note (Signed)
-   Patient requesting a refill on mirtazapine.  Refill sent to pharmacy

## 2021-09-28 NOTE — Progress Notes (Signed)
Internal Medicine Clinic Attending  Case discussed with Dr. Basaraba  At the time of the visit.  We reviewed the resident's history and exam and pertinent patient test results.  I agree with the assessment, diagnosis, and plan of care documented in the resident's note.  

## 2021-10-07 ENCOUNTER — Encounter: Payer: Self-pay | Admitting: Internal Medicine

## 2021-10-07 ENCOUNTER — Other Ambulatory Visit: Payer: Self-pay

## 2021-10-07 ENCOUNTER — Ambulatory Visit (INDEPENDENT_AMBULATORY_CARE_PROVIDER_SITE_OTHER): Payer: Medicare Other | Admitting: Internal Medicine

## 2021-10-07 VITALS — BP 139/71 | HR 80 | Temp 98.3°F | Ht 67.0 in | Wt 184.8 lb

## 2021-10-07 DIAGNOSIS — J329 Chronic sinusitis, unspecified: Secondary | ICD-10-CM

## 2021-10-07 DIAGNOSIS — E118 Type 2 diabetes mellitus with unspecified complications: Secondary | ICD-10-CM | POA: Diagnosis not present

## 2021-10-07 DIAGNOSIS — J449 Chronic obstructive pulmonary disease, unspecified: Secondary | ICD-10-CM | POA: Diagnosis not present

## 2021-10-07 DIAGNOSIS — M1711 Unilateral primary osteoarthritis, right knee: Secondary | ICD-10-CM | POA: Diagnosis not present

## 2021-10-07 LAB — GLUCOSE, CAPILLARY: Glucose-Capillary: 120 mg/dL — ABNORMAL HIGH (ref 70–99)

## 2021-10-07 LAB — POCT GLYCOSYLATED HEMOGLOBIN (HGB A1C): Hemoglobin A1C: 5.5 % (ref 4.0–5.6)

## 2021-10-07 MED ORDER — LORATADINE 10 MG PO TABS: 10.0000 mg | ORAL_TABLET | Freq: Every day | ORAL | 2 refills | Status: DC

## 2021-10-07 MED ORDER — IPRATROPIUM-ALBUTEROL 0.5-2.5 (3) MG/3ML IN SOLN
3.0000 mL | Freq: Once | RESPIRATORY_TRACT | Status: AC
  Administered 2021-10-07: 3 mL via RESPIRATORY_TRACT

## 2021-10-07 MED ORDER — FLUTICASONE FUROATE 100 MCG/ACT IN AEPB: 1.0000 | INHALATION_SPRAY | Freq: Every day | RESPIRATORY_TRACT | 0 refills | Status: DC

## 2021-10-07 MED ORDER — AMOXICILLIN-POT CLAVULANATE 500-125 MG PO TABS
1.0000 | ORAL_TABLET | Freq: Three times a day (TID) | ORAL | 0 refills | Status: DC
Start: 2021-10-07 — End: 2021-11-16

## 2021-10-07 MED ORDER — IPRATROPIUM-ALBUTEROL 0.5-2.5 (3) MG/3ML IN SOLN: 3.0000 mL | Freq: Once | RESPIRATORY_TRACT | Status: DC

## 2021-10-07 NOTE — Assessment & Plan Note (Signed)
>>  ASSESSMENT AND PLAN FOR CHRONIC OBSTRUCTIVE PULMONARY DISEASE WITH BRONCHOSPASM ([place]) WRITTEN ON 10/07/2021 12:31 PM BY Orvis Brill, MD  The patient is here today for routine follow-up.  She has not currently on any medications at home as these have been expensive for her in the past.  She continues to struggle with wheezing during the day.  She is asking for samples of inhalers which she has received here in the past.  P: I was unable to locate any samples to give to her today.  We have given her DuoNeb treatment in the office today.  I have also prescribed her a fluticasone inhaler.  The patient will let us know if she has trouble obtaining this medication.

## 2021-10-07 NOTE — Progress Notes (Signed)
CC: Routine follow-up  HPI:  [name] is a 70 y.o. with past medical history as noted below who presents to the clinic today for a routine follow-up. Please see problem-based list for further details, assessments, and plans.  Past Medical History:  Diagnosis Date   Acute sinusitis 01/09/2019   Anxiety    Arthritis    "fingers, knees" (08/16/2018)   Asthma    Atopic dermatitis 03/20/2018   Axillary hidradenitis suppurativa 10/13/2018   Cancer of right breast ([place]) 1991   s/p lumpectomy, chemotherapy and radiation therapy in 1991. Mammogram in 2007 was normal.   Constipated    h/o   COPD (chronic obstructive pulmonary disease) (HCC)    History of multiple hospital admissions for exercabation    COPD exacerbation ([place]) 01/01/2019   COPD with acute exacerbation ([place]) 01/14/2019   COPD with exacerbation ([place]) 04/06/2009   Qualifier: Diagnosis of  By: Eyvonne Mechanic MD, Vijay     Depression    Diarrhea    h/o   GERD (gastroesophageal reflux disease)    Headache    "a few times/month" (08/16/2018   Heart murmur 10/05/11   "first time I ever heard I had one was today"   History of breast cancer 12/01/2012   Pt with h/o breast CA s/p lumpectomy with chemo/radiation in 1991. Pt mammogram 2011 was unremarkable. Had CT chest 10/2012 in ED for SOB and showed spiculated nodule with lymph node. 12/04/12: Birads 2; repeat diagnostic mammogram in 1 year.     Hyperlipidemia    Hypertension    Lower extremity edema 09/21/2018   Obesity    Personal history of chemotherapy    Personal history of radiation therapy    Pneumonia    "couple times in the last 10-15 yrs" (08/16/2018)   QT prolongation 08/08/2014   Seasonal allergies 02/25/2017   Shortness of breath 10/05/11   "at rest; lying down; w/exertion"   Sigmoid diverticulitis 80/2008   Tobacco abuse    Type 2 diabetes mellitus ([place]) 05/14/2009   Type II diabetes mellitus ([place])    Review of Systems: Negative aside from that listed in  individualized problem based charting.  Physical Exam:  Vitals:   10/07/21 1109  BP: 139/71  Pulse: 80  Temp: 98.3 F (36.8 C)  TempSrc: Oral  SpO2: 100%  Weight: 184 lb 12.8 oz (83.8 kg)  Height: 5\' 7"  (1.702 m)   Physical Exam Constitutional:      General: She is not in acute distress.    Appearance: Normal appearance.  HENT:     Head: Normocephalic and atraumatic.     Right Ear: Tympanic membrane normal.     Left Ear: Tympanic membrane normal.     Nose:     Comments: Turbinates are edematous and obstructing the nasal canal.  The canal is erythematous. Eyes:     Extraocular Movements: Extraocular movements intact.     Pupils: Pupils are equal, round, and reactive to light.  Cardiovascular:     Rate and Rhythm: Normal rate and regular rhythm.     Heart sounds: No murmur heard.   No friction rub. No gallop.  Pulmonary:     Effort: Pulmonary effort is normal.     Comments: Inspiratory and expiratory wheezing in bilateral lower lung bases. Abdominal:     General: Abdomen is flat. There is no distension.  Skin:    General: Skin is warm and dry.  Neurological:     General: No focal deficit present.  Mental Status: She is alert and oriented to person, place, and time. Mental status is at baseline.  Psychiatric:        Mood and Affect: Mood normal.        Behavior: Behavior normal.    Assessment & Plan:   See Encounters Tab for problem based charting.  Patient seen with Dr. Jimmye Norman

## 2021-10-07 NOTE — Assessment & Plan Note (Addendum)
As I was leaving the room, patient was requesting a referral for her right knee pain.  She states that she has been unable to walk due to the pain.  Due to patient arriving late and running behind on schedule today, will need to address this further at her next visit in 1-2 weeks.

## 2021-10-07 NOTE — Patient Instructions (Signed)
Thank you, [name] for allowing [place] to provide your care today. Today we discussed your sinusitis and COPD.  I have given you augmentin for 7 days for your bacterial infection. Please take this as prescribed. I have also prescribed you a medication called loratidine and a medication called fluticasone to help with your symptoms. Please also take as prescribed. We have also given you an in-office treatment for your COPD. Please call us at [phone] if you have any issues.  I have ordered the following labs for you:  Lab Orders  No laboratory test(s) ordered today     Referrals ordered today:   Referral Orders  No referral(s) requested today     I have ordered the following medication/changed the following medications:   Stop the following medications: Medications Discontinued During This Encounter  Medication Reason   fluticasone furoate-vilanterol (BREO ELLIPTA) 100-25 MCG/INH AEPB    ipratropium-albuterol (DUONEB) 0.5-2.5 (3) MG/3ML nebulizer solution 3 mL      Start the following medications: Meds ordered this encounter  Medications   DISCONTD: ipratropium-albuterol (DUONEB) 0.5-2.5 (3) MG/3ML nebulizer solution 3 mL   amoxicillin-clavulanate (AUGMENTIN) 500-125 MG tablet    Sig: Take 1 tablet (500 mg total) by mouth 3 (three) times daily.    Dispense:  21 tablet    Refill:  0   Fluticasone Furoate 100 MCG/ACT AEPB    Sig: Inhale 1 puff into the lungs daily.    Dispense:  30 each    Refill:  0   loratadine (CLARITIN) 10 MG tablet    Sig: Take 1 tablet (10 mg total) by mouth daily.    Dispense:  30 tablet    Refill:  2   ipratropium-albuterol (DUONEB) 0.5-2.5 (3) MG/3ML nebulizer solution 3 mL     Follow up:  1-2 weeks     Should you have any questions or concerns please call the internal medicine clinic at [phone].

## 2021-10-07 NOTE — Assessment & Plan Note (Signed)
The patient is here today for routine follow-up.  She has not currently on any medications at home as these have been expensive for her in the past.  She continues to struggle with wheezing during the day.  She is asking for samples of inhalers which she has received here in the past.  P: I was unable to locate any samples to give to her today.  We have given her DuoNeb treatment in the office today.  I have also prescribed her a fluticasone inhaler.  The patient will let us know if she has trouble obtaining this medication.

## 2021-10-07 NOTE — Assessment & Plan Note (Addendum)
Patient has a history of chronic sinusitis.  In 2019, sinus CT showed epithelial adenomatoid hamartomas.  She was referred to ENT at that time, however patient did not feel that this was necessary and never followed up with them.  She endorses a feeling of "fullness" behind her eyes and in the maxillary sinus area.  Denies tooth pain.  She states frustration that she will often blow her nose and nothing will come out.  However she does endorse some green mucousy material that has come out though it was not foul-smelling.  On exam today, the turbinates are edematous and obstructing the nasal pathway.  Nasal canal is erythematous.  No active drainage noted.  Patient does not have pain in the sinus area with bending forward.  P: We will prescribe Augmentin for 7 days given recurrent symptoms and description of mucousy material.  We have also prescribed an antihistamine today for relief of her symptoms.  Patient will contact us if this is too expensive for her to obtain.

## 2021-10-07 NOTE — Assessment & Plan Note (Signed)
Patient requesting A1c testing today.  Last A1c of 5.4 in April.  -A1c pending

## 2021-10-20 NOTE — Progress Notes (Signed)
Internal Medicine Clinic Attending  I saw and evaluated the patient.  I personally confirmed the key portions of the history and exam documented by Dr. Lorin Glass and I reviewed pertinent patient test results.  The assessment, diagnosis, and plan were formulated together and I agree with the documentation in the residents note. Chronic sinusitis suspected, periodic drainage of thick green material experienced; her sinus drainage is likely greatly impeded by the nasal mucosal swelling and enlarge tubinates.  ENT referral is appropriate.

## 2021-11-03 ENCOUNTER — Ambulatory Visit (HOSPITAL_COMMUNITY)
Admission: EM | Admit: 2021-11-03 | Discharge: 2021-11-03 | Disposition: A | Discharge status: Other Acute Inpatient Hospital | Payer: Medicare Other | Attending: Physician Assistant | Admitting: Physician Assistant

## 2021-11-03 ENCOUNTER — Emergency Department (HOSPITAL_COMMUNITY)
Admission: EM | Admit: 2021-11-03 | Discharge: 2021-11-03 | Disposition: A | Discharge status: Home / Self Care | Payer: Medicare Other | Attending: Emergency Medicine | Admitting: Emergency Medicine

## 2021-11-03 ENCOUNTER — Other Ambulatory Visit: Payer: Self-pay

## 2021-11-03 ENCOUNTER — Encounter (HOSPITAL_COMMUNITY): Payer: Self-pay | Admitting: Emergency Medicine

## 2021-11-03 ENCOUNTER — Emergency Department (HOSPITAL_COMMUNITY): Payer: Medicare Other

## 2021-11-03 DIAGNOSIS — Z20822 Contact with and (suspected) exposure to covid-19: Secondary | ICD-10-CM | POA: Diagnosis not present

## 2021-11-03 DIAGNOSIS — Z5321 Procedure and treatment not carried out due to patient leaving prior to being seen by health care provider: Secondary | ICD-10-CM | POA: Diagnosis not present

## 2021-11-03 DIAGNOSIS — S2231XA Fracture of one rib, right side, initial encounter for closed fracture: Secondary | ICD-10-CM | POA: Diagnosis not present

## 2021-11-03 DIAGNOSIS — R0602 Shortness of breath: Secondary | ICD-10-CM | POA: Diagnosis not present

## 2021-11-03 DIAGNOSIS — J449 Chronic obstructive pulmonary disease, unspecified: Secondary | ICD-10-CM | POA: Diagnosis not present

## 2021-11-03 DIAGNOSIS — J439 Emphysema, unspecified: Secondary | ICD-10-CM | POA: Diagnosis not present

## 2021-11-03 LAB — I-STAT VENOUS BLOOD GAS, ED
Acid-Base Excess: 0 mmol/L (ref 0.0–2.0)
Bicarbonate: 25.6 mmol/L (ref 20.0–28.0)
Calcium, Ion: 1.15 mmol/L (ref 1.15–1.40)
HCT: 40 % (ref 36.0–46.0)
Hemoglobin: 13.6 g/dL (ref 12.0–15.0)
O2 Saturation: 99 %
Potassium: 3.5 mmol/L (ref 3.5–5.1)
Sodium: 140 mmol/L (ref 135–145)
TCO2: 27 mmol/L (ref 22–32)
pCO2, Ven: 46.5 mmHg (ref 44.0–60.0)
pH, Ven: 7.35 (ref 7.250–7.430)
pO2, Ven: 132 mmHg — ABNORMAL HIGH (ref 32.0–45.0)

## 2021-11-03 LAB — CBC WITH DIFFERENTIAL/PLATELET
Abs Immature Granulocytes: 0.02 10*3/uL (ref 0.00–0.07)
Basophils Absolute: 0 10*3/uL (ref 0.0–0.1)
Basophils Relative: 0 %
Eosinophils Absolute: 0.3 10*3/uL (ref 0.0–0.5)
Eosinophils Relative: 4 %
HCT: 40.2 % (ref 36.0–46.0)
Hemoglobin: 13.1 g/dL (ref 12.0–15.0)
Immature Granulocytes: 0 %
Lymphocytes Relative: 13 %
Lymphs Abs: 1.1 10*3/uL (ref 0.7–4.0)
MCH: 30.3 pg (ref 26.0–34.0)
MCHC: 32.6 g/dL (ref 30.0–36.0)
MCV: 93.1 fL (ref 80.0–100.0)
Monocytes Absolute: 0.2 10*3/uL (ref 0.1–1.0)
Monocytes Relative: 2 %
Neutro Abs: 7.2 10*3/uL (ref 1.7–7.7)
Neutrophils Relative %: 81 %
Platelets: 301 10*3/uL (ref 150–400)
RBC: 4.32 MIL/uL (ref 3.87–5.11)
RDW: 13 % (ref 11.5–15.5)
WBC: 8.9 10*3/uL (ref 4.0–10.5)
nRBC: 0 % (ref 0.0–0.2)

## 2021-11-03 LAB — BASIC METABOLIC PANEL
Anion gap: 13 (ref 5–15)
BUN: 11 mg/dL (ref 8–23)
CO2: 23 mmol/L (ref 22–32)
Calcium: 9.1 mg/dL (ref 8.9–10.3)
Chloride: 105 mmol/L (ref 98–111)
Creatinine, Ser: 0.79 mg/dL (ref 0.44–1.00)
GFR, Estimated: >60 mL/min (ref >60)
Glucose, Bld: 123 mg/dL — ABNORMAL HIGH (ref 70–99)
Potassium: 3.7 mmol/L (ref 3.5–5.1)
Sodium: 141 mmol/L (ref 135–145)

## 2021-11-03 LAB — RESP PANEL BY RT-PCR (FLU A&B, COVID) ARPGX2
Influenza A by PCR: NEGATIVE
Influenza B by PCR: NEGATIVE
SARS Coronavirus 2 by RT PCR: NEGATIVE

## 2021-11-03 MED ORDER — ALBUTEROL SULFATE HFA 108 (90 BASE) MCG/ACT IN AERS
8.0000 | INHALATION_SPRAY | Freq: Once | RESPIRATORY_TRACT | Status: AC
  Administered 2021-11-03: 16:00:00 8 via RESPIRATORY_TRACT
  Filled 2021-11-03: qty 6.7

## 2021-11-03 MED ORDER — ALBUTEROL SULFATE HFA 108 (90 BASE) MCG/ACT IN AERS
INHALATION_SPRAY | RESPIRATORY_TRACT | Status: AC
  Filled 2021-11-03: qty 6.7

## 2021-11-03 MED ORDER — METHYLPREDNISOLONE SODIUM SUCC 125 MG IJ SOLR
80.0000 mg | Freq: Once | INTRAMUSCULAR | Status: AC
  Administered 2021-11-03: 14:00:00 80 mg via INTRAVENOUS

## 2021-11-03 MED ORDER — METHYLPREDNISOLONE SODIUM SUCC 125 MG IJ SOLR
125.0000 mg | Freq: Once | INTRAMUSCULAR | Status: AC
  Administered 2021-11-03: 16:00:00 125 mg via INTRAVENOUS
  Filled 2021-11-03: qty 2

## 2021-11-03 MED ORDER — METHYLPREDNISOLONE SODIUM SUCC 125 MG IJ SOLR
INTRAMUSCULAR | Status: AC
  Filled 2021-11-03: qty 2

## 2021-11-03 NOTE — ED Provider Notes (Signed)
Emergency Medicine Provider Triage Evaluation Note  [name] , a 70 y.o. female  was evaluated in triage.  Pt complains of shortness of breath for the last 4 days.  Taking her Spiriva at home but has been out of her rescue inhaler and nebulizer treatments.  States that she feels like she has in the past when she required admission for her COPD exacerbation.  Denies fevers or chills.  Endorses cough..  Review of Systems  Positive: Cough, chest tightness, shortness of breath Negative: Peers, chills, chest pain  Physical Exam  BP (!) 143/98 (BP Location: Left Arm)    Pulse (!) 102    Temp 98 F (36.7 C) (Oral)    Resp (!) 24    SpO2 100%  Gen:   Awake, no distress   Resp:  Increased work of breathing, dyspneic with speech, tachypneic, wheezing audible across the room and throughout the lung fields bilaterally MSK:   Moves extremities without difficulty  Other:  And O x3.  Mildly tachycardic with heart rate of 105, regular rhythm.  Medical Decision Making  Medically screening exam initiated at 3:18 PM.  Appropriate orders placed.  [name] was informed that the remainder of the evaluation will be completed by another provider, this initial triage assessment does not replace that evaluation, and the importance of remaining in the ED until their evaluation is complete.  Will give albuterol inhaler in triage.   This chart was dictated using voice recognition software, Dragon. Despite the best efforts of this provider to proofread and correct errors, errors may still occur which can change documentation meaning.    Aura Dials 11/03/21 1525    Truddie Hidden, MD 11/03/21 [phone]

## 2021-11-03 NOTE — ED Triage Notes (Signed)
Pt has COPD having SOB since last night. Reports out of inhaler-rescue one and hasnt used nebulizer today.

## 2021-11-03 NOTE — ED Notes (Signed)
Pt left with [facility] 

## 2021-11-03 NOTE — ED Triage Notes (Signed)
Pt from UC via [facility] for further eval of shob. Hx of COPD and has been out of her rescue inhaler and nebulizer.

## 2021-11-03 NOTE — ED Notes (Addendum)
Verbal order from [place] PA for pt to be given 6 puffs of albuterol inhaler.  Inhaler given to patient and using at this time

## 2021-11-03 NOTE — ED Notes (Addendum)
Upon entering back to triage to patient and Dennis PA. This RN was informed that patient only administered 3 puffs of inhaler due to tolerance. Pt placed on 2L o2 via nasal canula.

## 2021-11-03 NOTE — ED Notes (Signed)
Pt said she could no longer wait. Pt was encouraged to stay. Pt left AMA

## 2021-11-03 NOTE — ED Notes (Signed)
[facility] called for transport to ED. Charge RN at [facility] ED made aware of patient coming via [facility].

## 2021-11-03 NOTE — ED Notes (Signed)
Patient is being discharged from the Urgent Care and sent to the Emergency Department via [place] . Per Verna Czech, PA, patient is in need of higher level of care due to shortness of breath. Patient is aware and verbalizes understanding of plan of care.  Vitals:   11/03/21 1333  BP: (!) 145/62  Pulse: 93  Resp: (!) 30  SpO2: 100%

## 2021-11-04 ENCOUNTER — Ambulatory Visit (INDEPENDENT_AMBULATORY_CARE_PROVIDER_SITE_OTHER): Payer: Medicare Other | Admitting: Internal Medicine

## 2021-11-04 ENCOUNTER — Telehealth: Payer: Self-pay | Admitting: *Deleted

## 2021-11-04 DIAGNOSIS — J449 Chronic obstructive pulmonary disease, unspecified: Secondary | ICD-10-CM | POA: Diagnosis not present

## 2021-11-04 DIAGNOSIS — J441 Chronic obstructive pulmonary disease with (acute) exacerbation: Secondary | ICD-10-CM | POA: Diagnosis not present

## 2021-11-04 MED ORDER — IPRATROPIUM-ALBUTEROL 0.5-2.5 (3) MG/3ML IN SOLN
3.0000 mL | Freq: Four times a day (QID) | RESPIRATORY_TRACT | 1 refills | Status: DC | PRN
Start: 2021-11-04 — End: 2021-12-23

## 2021-11-04 MED ORDER — PREDNISONE 20 MG PO TABS
40.0000 mg | ORAL_TABLET | Freq: Every day | ORAL | 0 refills | Status: AC
Start: 2021-11-04 — End: 2021-11-09

## 2021-11-04 MED ORDER — AZITHROMYCIN 250 MG PO TABS: ORAL_TABLET | ORAL | 0 refills | Status: AC

## 2021-11-04 NOTE — Telephone Encounter (Signed)
Call from pt stated she went to UC then ED yesterday but had to leave after waiting 6 hrs. I asked pt to tell me about her symptoms - she stated this has been going on since her last visit here - c/o cough, sinus drainage of clear mucous,stuffy nose, sl sob, some wheezing, no fever. Pt stated usually an Abx/Prednisone work for her. Telehealth appt schedule today w/Dr Charleen Kirks @ 1415PM. Pt instructed to go back to UC/ED if she develop increase sob or worsen of symptoms before her appt today - stated she understands.

## 2021-11-04 NOTE — Assessment & Plan Note (Addendum)
[name] states that for approximately 7 days, she has been experiencing increased cough with yellow sputum production, increased sinus drainage and increased wheezing.  Today, she has not noticed any worsening in her breathing but was short of breath yesterday prior to going to the urgent care.  She notes that she left after waiting in the waiting room for about 7 hours.  She has been administering duo nebs approximately 3 times per day for the last 7 days which is increased compared to her baseline at which time she only uses DuoNeb once a day.  [name] denies any fever, chills, nausea, vomiting.  Assessment/Plan:  Symptoms consistent with a COPD exacerbation.  Given symptoms have been ongoing for more than 7 days now, low utility in testing for COVID or influenza.  We will treat with prednisone and azithromycin.  Recommended follow-up in the clinic in a week.  Counseled to go to the ED urgently should she develop any worsening in her breathing.  - Prednisone 40 mg daily for 5 days - Azithromycin 500 mg followed by 250 mg for 5 days - Refilled duo nebs - 1 week follow-up

## 2021-11-04 NOTE — Progress Notes (Signed)
°  [facility] Internal Medicine Residency Telephone Encounter Continuity Care Appointment  HPI:  This telephone encounter was created for [name] on 11/04/2021 for the following purpose/cc COPD exacerbation.   Past Medical History:  Past Medical History:  Diagnosis Date   Acute sinusitis 01/09/2019   Anxiety    Arthritis    "fingers, knees" (08/16/2018)   Asthma    Atopic dermatitis 03/20/2018   Axillary hidradenitis suppurativa 10/13/2018   Cancer of right breast ([place]) 1991   s/p lumpectomy, chemotherapy and radiation therapy in 1991. Mammogram in 2007 was normal.   Constipated    h/o   COPD (chronic obstructive pulmonary disease) (HCC)    History of multiple hospital admissions for exercabation    COPD exacerbation ([place]) 01/01/2019   COPD with acute exacerbation ([place]) 01/14/2019   COPD with exacerbation ([place]) 04/06/2009   Qualifier: Diagnosis of  By: Eyvonne Mechanic MD, Vijay     Depression    Diarrhea    h/o   GERD (gastroesophageal reflux disease)    Headache    "a few times/month" (08/16/2018   Heart murmur 10/05/11   "first time I ever heard I had one was today"   History of breast cancer 12/01/2012   Pt with h/o breast CA s/p lumpectomy with chemo/radiation in 1991. Pt mammogram 2011 was unremarkable. Had CT chest 10/2012 in ED for SOB and showed spiculated nodule with lymph node. 12/04/12: Birads 2; repeat diagnostic mammogram in 1 year.     Hyperlipidemia    Hypertension    Lower extremity edema 09/21/2018   Obesity    Personal history of chemotherapy    Personal history of radiation therapy    Pneumonia    "couple times in the last 10-15 yrs" (08/16/2018)   QT prolongation 08/08/2014   Seasonal allergies 02/25/2017   Shortness of breath 10/05/11   "at rest; lying down; w/exertion"   Sigmoid diverticulitis 80/2008   Tobacco abuse    Type 2 diabetes mellitus ([place]) 05/14/2009   Type II diabetes mellitus (HCC)      ROS:  + sinus drainage, cough with sputum  production, increased use of inhaler  - SOB   Assessment / Plan / Recommendations:  Please see A&P under problem oriented charting for assessment of the patient's acute and chronic medical conditions.  As always, pt is advised that if symptoms worsen or new symptoms arise, they should go to an urgent care facility or to to ER for further evaluation.   Consent and Medical Decision Making:  Patient discussed with Dr. Daryll Drown This is a telephone encounter between [name] and Jose Persia on 11/04/2021 for COPD exacerbation. The visit was conducted with the patient located at home and Jose Persia at [facility]. The patient's identity was confirmed using their DOB and current address. The patient has consented to being evaluated through a telephone encounter and understands the associated risks (an examination cannot be done and the patient may need to come in for an appointment) / benefits (allows the patient to remain at home, decreasing exposure to coronavirus). I personally spent 12 minutes on medical discussion.

## 2021-11-09 NOTE — Progress Notes (Signed)
Internal Medicine Clinic Attending  Case discussed with Dr. Basaraba  at the time of the visit.  We reviewed the resident's history and pertinent patient test results.  I agree with the assessment, diagnosis, and plan of care documented in the resident's note.  

## 2021-11-10 ENCOUNTER — Telehealth: Payer: Self-pay

## 2021-11-10 NOTE — Telephone Encounter (Signed)
PA  for pt ( IPRATROPIUM- ALBUTEROL 0.5- 2.5 (3) MG/3ML SOLUTION )  came through on cover my meds was done and submitted with office notes from 12/28.Marland Kitchen Awaiting approval or denial

## 2021-11-16 ENCOUNTER — Encounter: Payer: Self-pay | Admitting: Internal Medicine

## 2021-11-16 ENCOUNTER — Ambulatory Visit (INDEPENDENT_AMBULATORY_CARE_PROVIDER_SITE_OTHER): Payer: Medicare Other | Admitting: Internal Medicine

## 2021-11-16 DIAGNOSIS — J069 Acute upper respiratory infection, unspecified: Secondary | ICD-10-CM | POA: Insufficient documentation

## 2021-11-16 NOTE — Progress Notes (Signed)
CC: persistent sinus pain, respiratory symptoms  This is a telephone encounter between [name] and [name] on 11/16/2021 for persistent sinus pain, respiratory symptoms. The visit was conducted with the patient located at home and [name] at [facility]. The patient's identity was confirmed using their DOB and current address. The patient has consented to being evaluated through a telephone encounter and understands the associated risks (an examination cannot be done and the patient may need to come in for an appointment) / benefits (allows the patient to remain at home, decreasing exposure to coronavirus). I personally spent 10 minutes on medical discussion.   HPI:  [name] is a 71 y.o. with PMH as below.  Patient has requested a telehealth visit for persistent sinus and respiratory symptoms.  Patient reports 2 weeks of sinus congestion, cough, wheezing.  On 12/27 patient presented to urgent care who then sent her to the ED for her symptoms.  Patient was seen in the ED at [facility] by triage who did COVID/flu test and initial assessment.  Patient then continued to wait for 6 hours and ultimately went home.  COVID and flu negative.  Patient then had a telehealth appointment with [name] 12/28.  At that time patient was experiencing increased cough with yellow sputum production, increased wheezing, without SOB.  She was prescribed 5-day course of prednisone and azithromycin and encouraged to use DuoNeb as needed.  Patient completed prednisone and azithromycin 1 week ago. Today she endorses SOB when lying down due to awful sinus congestion, persistent cough that has not worsened or improved in the last 2 weeks, as well as sneezing, wheezing.  Reports loss of taste and smell.  Patient has been using Afrin since yesterday, 3 times yesterday and once today.  Counseled on avoiding frequent Afrin use.  She has also been using DuoNeb every 4-6 hours.  Reports she has albuterol (Proventil) at home,  though usually her rescue inhaler is albuterol sulfate (Ventolin).  Otherwise she is not using any other inhalers, though has previously been prescribed Trelegy Ellipta and Spiriva Respimat.  Discussed patient can continue to use a DuoNeb as needed.  For sinus symptoms patient can use nasal spray and Flonase (patient prefers OTC Flonase since this is cheaper).   On assessment, patient likely has viral upper respiratory infection with persistence of symptoms.  Frequent use of Afrin in the last 2 days may have exacerbated sinus symptoms.  Patient tested COVID and flu negative in the ED 12/27, however reports currently no taste or smell.  Encouraged to take at home COVID test. Additionally, wheezing with reported intermittent shortness of breath and persistent cough now 2 weeks from symptom onset has me concerned about under treated COPD exacerbation or development of bacterial pneumonia though less likely.  Patient would benefit from an in person office visit to better assess lower respiratory status and oxygenation status to further guide management.  Precautions given for when to present to ED vs urgent care.  Plan: -At home COVID test tonight -OTC saline nasal spray -OTC Flonase (patient preference) -OTC Claritin -DuoNeb as needed every 6 hours -Office visit in the next 1 to 3 days  -Consider referral to clinical pharmacy to figure out COPD regimen, given financial barriers in the past affording inhalers  Please see A&P for assessment of the patient's acute and chronic medical conditions.   Past Medical History:  Diagnosis Date   Acute sinusitis 01/09/2019   Anxiety    Arthritis    "fingers,  knees" (08/16/2018)   Asthma    Atopic dermatitis 03/20/2018   Axillary hidradenitis suppurativa 10/13/2018   Cancer of right breast ([place]) 1991   s/p lumpectomy, chemotherapy and radiation therapy in 1991. Mammogram in 2007 was normal.   Constipated    h/o   COPD (chronic obstructive pulmonary disease)  (HCC)    History of multiple hospital admissions for exercabation    COPD exacerbation ([place]) 01/01/2019   COPD with acute exacerbation ([place]) 01/14/2019   COPD with exacerbation ([place]) 04/06/2009   Qualifier: Diagnosis of  By: Eyvonne Mechanic MD, Vijay     Depression    Diarrhea    h/o   GERD (gastroesophageal reflux disease)    Headache    "a few times/month" (08/16/2018   Heart murmur 10/05/11   "first time I ever heard I had one was today"   History of breast cancer 12/01/2012   Pt with h/o breast CA s/p lumpectomy with chemo/radiation in 1991. Pt mammogram 2011 was unremarkable. Had CT chest 10/2012 in ED for SOB and showed spiculated nodule with lymph node. 12/04/12: Birads 2; repeat diagnostic mammogram in 1 year.     Hyperlipidemia    Hypertension    Lower extremity edema 09/21/2018   Obesity    Personal history of chemotherapy    Personal history of radiation therapy    Pneumonia    "couple times in the last 10-15 yrs" (08/16/2018)   QT prolongation 08/08/2014   Seasonal allergies 02/25/2017   Shortness of breath 10/05/11   "at rest; lying down; w/exertion"   Sigmoid diverticulitis 80/2008   Tobacco abuse    Type 2 diabetes mellitus ([place]) 05/14/2009   Type II diabetes mellitus ([place])    Review of Systems: Negative aside from above in HPI    Assessment & Plan:   See Encounters Tab for problem based charting.  Patient discussed with Dr.  Illene Regulus, MD 11/16/21, 3:56 PM Pager: [phone] Internal Medicine Resident, PGY-1 [name]

## 2021-11-16 NOTE — Assessment & Plan Note (Addendum)
Patient has requested a telehealth visit for persistent sinus and respiratory symptoms.  Patient reports 2 weeks of sinus congestion, cough, wheezing.  On 12/27 patient presented to urgent care who then sent her to the ED for her symptoms.  Patient was seen in the ED at [facility] by triage who did COVID/flu test and initial assessment.  Patient then continued to wait for 6 hours and ultimately went home.  COVID and flu negative.  Patient then had a telehealth appointment with [facility] 12/28.  At that time patient was experiencing increased cough with yellow sputum production, increased wheezing, without SOB.  She was prescribed 5-day course of prednisone and azithromycin and encouraged to use DuoNeb as needed.  Patient completed prednisone and azithromycin 1 week ago. Today she endorses SOB when lying down due to awful sinus congestion, persistent cough that has not worsened or improved in the last 2 weeks, as well as sneezing, wheezing.  Reports loss of taste and smell.  Patient has been using Afrin since yesterday, 3 times yesterday and once today.  Counseled on avoiding frequent Afrin use.  She has also been using DuoNeb every 4-6 hours.  Reports she has albuterol (Proventil) at home, though usually her rescue inhaler is albuterol sulfate (Ventolin).  Otherwise she is not using any other inhalers, though has previously been prescribed Trelegy Ellipta and Spiriva Respimat.  Discussed patient can continue to use a DuoNeb as needed.  For sinus symptoms patient can use nasal spray and Flonase (patient prefers OTC Flonase since this is cheaper).   On assessment, patient likely has viral upper respiratory infection with persistence of symptoms.  Frequent use of Afrin in the last 2 days may have exacerbated sinus symptoms.  Patient tested COVID and flu negative in the ED 12/27, however reports currently no taste or smell.  Encouraged to take at home COVID test. Additionally, wheezing with reported intermittent shortness of  breath and persistent cough now 2 weeks from symptom onset has me concerned about under treated COPD exacerbation or development of bacterial pneumonia though less likely.  Patient would benefit from an in person office visit to better assess lower respiratory status and oxygenation status to further guide management.  Precautions given for when to present to ED vs urgent care.  Plan: -At home COVID test tonight -OTC saline nasal spray -OTC Flonase (patient preference) -OTC Claritin -DuoNeb as needed every 6 hours -Office visit in the next 1 to 3 days  -Consider referral to clinical pharmacy to figure out COPD regimen, given financial barriers in the past affording inhalers

## 2021-11-16 NOTE — Telephone Encounter (Signed)
DECISION :    Denied  reasons for denial as followed     Physician denied your request is because the requested medication may be covered  under medicare part A or part B ...   Specific info needed to approve the request would be :   Confirmation from your DR that the medication is being administered in a nursing facility ( [place]) or intermediate  care facility .Marland Kitchen     Per centers of medicare and medicaid services ( CMS)  guidelines , certain nebulizer ( an oral inhalation device ) medications that are administered to a patient who is not residing in a nursing facility ( long term care facility )  or intermediate  care facility , are excluded ( not covered )  from the coverage under medicare part D and may be covered under medicare part B ...     ( [place] )

## 2021-12-03 NOTE — Progress Notes (Signed)
Internal Medicine Clinic Attending ? ?Case discussed with Dr. Zinoviev  At the time of the visit.  We reviewed the resident?s history and exam and pertinent patient test results.  I agree with the assessment, diagnosis, and plan of care documented in the resident?s note.  ?

## 2021-12-08 ENCOUNTER — Other Ambulatory Visit (HOSPITAL_COMMUNITY): Payer: Self-pay

## 2021-12-14 ENCOUNTER — Ambulatory Visit (INDEPENDENT_AMBULATORY_CARE_PROVIDER_SITE_OTHER): Payer: Medicare Other | Admitting: Internal Medicine

## 2021-12-14 DIAGNOSIS — J441 Chronic obstructive pulmonary disease with (acute) exacerbation: Secondary | ICD-10-CM

## 2021-12-14 DIAGNOSIS — J329 Chronic sinusitis, unspecified: Secondary | ICD-10-CM | POA: Diagnosis not present

## 2021-12-14 DIAGNOSIS — J449 Chronic obstructive pulmonary disease, unspecified: Secondary | ICD-10-CM

## 2021-12-14 DIAGNOSIS — E1142 Type 2 diabetes mellitus with diabetic polyneuropathy: Secondary | ICD-10-CM

## 2021-12-14 MED ORDER — PREDNISONE 20 MG PO TABS: 20.0000 mg | ORAL_TABLET | Freq: Every day | ORAL | 0 refills | Status: DC

## 2021-12-14 NOTE — Assessment & Plan Note (Signed)
Patient has a history of chronic sinusitis.  She was referred to ENT in 2019, however patient has not followed up with them for further evaluation.  -We will place new referral to ENT today given history of chronic sinusitis.

## 2021-12-14 NOTE — Assessment & Plan Note (Addendum)
The patient is seen today for a virtual visit for worsening shortness of breath, clear sputum production, and increased wheezing.  Tested negative for COVID at home.  Also endorses loss of taste and smell.  States that her sinuses feel "full."  Review of records shows that she has been treated for a COPD exacerbation about 2 months ago with azithromycin and prednisone regimens (5 days total).  She states that she has tried emergency inhalers at home, however she has run out of these recently.  She states that she has tried trellegy and Spiriva in the past, however review of records shows that these have not been filled recently, and there has been a documented cost issue with these for the patient.  Today, the patient also confirmed that these have been expensive for her.  Exam is limited in the setting of a virtual visit.  Patient is able to speak in full sentences, possibly with some audible wheezing over the phone.  Plan: I have prescribed the patient a 5-day course of low-dose prednisone for a mild COPD exacerbation in the setting of inability to afford treatments for her COPD on a regular basis.  I have also placed a referral to community care coordination to see if we can help the patient to afford these medications in the future.  I have instructed the patient to let us know if her symptoms do not improve with the steroid regimen, however I would also like the patient to be seen in the office next week for follow-up and can address affordability of her medications at this visit.

## 2021-12-14 NOTE — Progress Notes (Signed)
°  [facility] Internal Medicine Residency Telephone Encounter Continuity Care Appointment  HPI:  This telephone encounter was created for [name] on 12/14/2021 for the following purpose/cc cough, dyspnea, and sputum production.   Past Medical History:  Past Medical History:  Diagnosis Date   Acute sinusitis 01/09/2019   Anxiety    Arthritis    "fingers, knees" (08/16/2018)   Asthma    Atopic dermatitis 03/20/2018   Axillary hidradenitis suppurativa 10/13/2018   Cancer of right breast ([place]) 1991   s/p lumpectomy, chemotherapy and radiation therapy in 1991. Mammogram in 2007 was normal.   Constipated    h/o   COPD (chronic obstructive pulmonary disease) (HCC)    History of multiple hospital admissions for exercabation    COPD exacerbation ([place]) 01/01/2019   COPD with acute exacerbation ([place]) 01/14/2019   COPD with exacerbation ([place]) 04/06/2009   Qualifier: Diagnosis of  By: Eyvonne Mechanic MD, Vijay     Depression    Diarrhea    h/o   GERD (gastroesophageal reflux disease)    Headache    "a few times/month" (08/16/2018   Heart murmur 10/05/11   "first time I ever heard I had one was today"   History of breast cancer 12/01/2012   Pt with h/o breast CA s/p lumpectomy with chemo/radiation in 1991. Pt mammogram 2011 was unremarkable. Had CT chest 10/2012 in ED for SOB and showed spiculated nodule with lymph node. 12/04/12: Birads 2; repeat diagnostic mammogram in 1 year.     Hyperlipidemia    Hypertension    Lower extremity edema 09/21/2018   Obesity    Personal history of chemotherapy    Personal history of radiation therapy    Pneumonia    "couple times in the last 10-15 yrs" (08/16/2018)   QT prolongation 08/08/2014   Seasonal allergies 02/25/2017   Shortness of breath 10/05/11   "at rest; lying down; w/exertion"   Sigmoid diverticulitis 80/2008   Tobacco abuse    Type 2 diabetes mellitus ([place]) 05/14/2009   Type II diabetes mellitus ([place])      ROS:  Negative aside from that  listed in individualized problem based charting.   Assessment / Plan / Recommendations:  Please see A&P under problem oriented charting for assessment of the patient's acute and chronic medical conditions.  As always, pt is advised that if symptoms worsen or new symptoms arise, they should go to an urgent care facility or to to ER for further evaluation.   Consent and Medical Decision Making:  Patient discussed with Dr. Angelia Mould This is a telephone encounter between [name] and [name] on 12/14/2021 for cough, sputum production, SOB. The visit was conducted with the patient located at home and [name] at [facility]. The patient's identity was confirmed using their DOB and current address. The patient has consented to being evaluated through a telephone encounter and understands the associated risks (an examination cannot be done and the patient may need to come in for an appointment) / benefits (allows the patient to remain at home, decreasing exposure to coronavirus). I personally spent 10 minutes on medical discussion.

## 2021-12-15 ENCOUNTER — Telehealth: Payer: Self-pay | Admitting: *Deleted

## 2021-12-15 NOTE — Chronic Care Management (AMB) (Signed)
°  Care Management   Note  12/15/2021 Name: [name] MRN: [id number] DOB: [birth year]  [name] is a 71 y.o. year old female who is a primary care patient of Orvis Brill, MD. I reached out to [name] by phone today in response to a referral sent by Ms. Mora Bellman Nakayama's primary care provider.   Ms. Tocco was given information about care management services today including:  Care management services include personalized support from designated clinical staff supervised by her physician, including individualized plan of care and coordination with other care providers 24/7 contact phone numbers for assistance for urgent and routine care needs. The patient may stop care management services at any time by phone call to the office staff.  Patient agreed to services and verbal consent obtained.   Follow up plan: Telephone appointment with care management team member scheduled for:12/21/21  [facility]  Direct Dial: [phone]

## 2021-12-21 ENCOUNTER — Telehealth: Payer: Medicare Other | Admitting: Licensed Clinical Social Worker

## 2021-12-22 NOTE — Progress Notes (Signed)
Internal Medicine Clinic Attending ° °Case discussed with Dr. Bonanno  °  At the time of the visit.  We reviewed the resident’s history and exam and pertinent patient test results.  I agree with the assessment, diagnosis, and plan of care documented in the resident’s note. ° °

## 2021-12-23 ENCOUNTER — Other Ambulatory Visit: Payer: Self-pay

## 2021-12-23 ENCOUNTER — Encounter: Payer: Medicare Other | Admitting: Internal Medicine

## 2021-12-23 DIAGNOSIS — J449 Chronic obstructive pulmonary disease, unspecified: Secondary | ICD-10-CM

## 2021-12-23 DIAGNOSIS — E1142 Type 2 diabetes mellitus with diabetic polyneuropathy: Secondary | ICD-10-CM

## 2021-12-24 ENCOUNTER — Other Ambulatory Visit: Payer: Self-pay

## 2021-12-24 DIAGNOSIS — E1142 Type 2 diabetes mellitus with diabetic polyneuropathy: Secondary | ICD-10-CM

## 2021-12-24 MED ORDER — PREGABALIN 100 MG PO CAPS: 100.0000 mg | ORAL_CAPSULE | Freq: Three times a day (TID) | ORAL | 0 refills | Status: DC

## 2021-12-24 MED ORDER — IPRATROPIUM-ALBUTEROL 0.5-2.5 (3) MG/3ML IN SOLN: 3.0000 mL | Freq: Four times a day (QID) | RESPIRATORY_TRACT | 1 refills | Status: DC | PRN

## 2021-12-24 NOTE — Addendum Note (Signed)
Addended by: Orvis Brill on: 12/24/2021 09:53 PM   Modules accepted: Orders

## 2021-12-25 DIAGNOSIS — Z20822 Contact with and (suspected) exposure to covid-19: Secondary | ICD-10-CM | POA: Diagnosis not present

## 2021-12-28 ENCOUNTER — Ambulatory Visit (INDEPENDENT_AMBULATORY_CARE_PROVIDER_SITE_OTHER): Payer: Medicare Other | Admitting: Internal Medicine

## 2021-12-28 DIAGNOSIS — J329 Chronic sinusitis, unspecified: Secondary | ICD-10-CM | POA: Diagnosis not present

## 2021-12-28 DIAGNOSIS — J441 Chronic obstructive pulmonary disease with (acute) exacerbation: Secondary | ICD-10-CM | POA: Diagnosis not present

## 2021-12-28 DIAGNOSIS — J449 Chronic obstructive pulmonary disease, unspecified: Secondary | ICD-10-CM

## 2021-12-28 MED ORDER — IPRATROPIUM-ALBUTEROL 0.5-2.5 (3) MG/3ML IN SOLN: 3.0000 mL | Freq: Four times a day (QID) | RESPIRATORY_TRACT | 1 refills | Status: DC | PRN

## 2021-12-28 MED ORDER — FLUTICASONE FUROATE 100 MCG/ACT IN AEPB: 1.0000 | INHALATION_SPRAY | Freq: Every day | RESPIRATORY_TRACT | 0 refills | Status: DC

## 2021-12-28 NOTE — Assessment & Plan Note (Signed)
Patient scheduled for telehealth follow up today after her last telehealth visit two weeks ago for suspected COPD exacerbation. Discussed limitations of evaluation over the phone for patient's current problem and would like her to come in to clinic to be evaluated in the next several days so a thorough physical exam can be done. Patient is still having some symptoms though her cough and shortness of breath improved a little after the steroids. Patient says she may be able to afford her inhalers if they are sent to the pharmacy.  Plan: - in person follow up in the next week to reassess - return precautions/reasons to go to the ED discussed with patient - refill inhalers today

## 2021-12-28 NOTE — Progress Notes (Signed)
°  [facility] Internal Medicine Residency Telephone Encounter Continuity Care Appointment  HPI:  This telephone encounter was created for [name] on 12/28/2021 for the following purpose/cc COPD follow up.   Past Medical History:  Past Medical History:  Diagnosis Date   Acute sinusitis 01/09/2019   Anxiety    Arthritis    "fingers, knees" (08/16/2018)   Asthma    Atopic dermatitis 03/20/2018   Axillary hidradenitis suppurativa 10/13/2018   Cancer of right breast ([place]) 1991   s/p lumpectomy, chemotherapy and radiation therapy in 1991. Mammogram in 2007 was normal.   Constipated    h/o   COPD (chronic obstructive pulmonary disease) (HCC)    History of multiple hospital admissions for exercabation    COPD exacerbation ([place]) 01/01/2019   COPD with acute exacerbation ([place]) 01/14/2019   COPD with exacerbation ([place]) 04/06/2009   Qualifier: Diagnosis of  By: Eyvonne Mechanic MD, Vijay     Depression    Diarrhea    h/o   GERD (gastroesophageal reflux disease)    Headache    "a few times/month" (08/16/2018   Heart murmur 10/05/11   "first time I ever heard I had one was today"   History of breast cancer 12/01/2012   Pt with h/o breast CA s/p lumpectomy with chemo/radiation in 1991. Pt mammogram 2011 was unremarkable. Had CT chest 10/2012 in ED for SOB and showed spiculated nodule with lymph node. 12/04/12: Birads 2; repeat diagnostic mammogram in 1 year.     Hyperlipidemia    Hypertension    Lower extremity edema 09/21/2018   Obesity    Personal history of chemotherapy    Personal history of radiation therapy    Pneumonia    "couple times in the last 10-15 yrs" (08/16/2018)   QT prolongation 08/08/2014   Seasonal allergies 02/25/2017   Shortness of breath 10/05/11   "at rest; lying down; w/exertion"   Sigmoid diverticulitis 80/2008   Tobacco abuse    Type 2 diabetes mellitus ([place]) 05/14/2009   Type II diabetes mellitus (HCC)      ROS:  Positive for cough, sinus congestion, stuffy  nose, shortness of breath   Assessment / Plan / Recommendations:  Please see A&P under problem oriented charting for assessment of the patient's acute and chronic medical conditions.  As always, pt is advised that if symptoms worsen or new symptoms arise, they should go to an urgent care facility or to to ER for further evaluation.   Consent and Medical Decision Making:  Patient discussed with Dr.  Cain Sieve This is a telephone encounter between [name] and [place] on 12/28/2021 for COPD follow up. The visit was conducted with the patient located at home and [facility] at [facility]. The patient's identity was confirmed using their DOB and current address. The patient has consented to being evaluated through a telephone encounter and understands the associated risks (an examination cannot be done and the patient may need to come in for an appointment) / benefits (allows the patient to remain at home, decreasing exposure to coronavirus). I personally spent 15 minutes on medical discussion.

## 2021-12-28 NOTE — Assessment & Plan Note (Signed)
Patient still complaining of chronic sinusitis, declined referral to ENT at this time.

## 2021-12-29 ENCOUNTER — Telehealth: Payer: Self-pay | Admitting: Internal Medicine

## 2021-12-29 NOTE — Telephone Encounter (Signed)
-----   Message from Scarlett Presto, MD sent at 12/28/2021  5:06 PM EST ----- Regarding: clinic follow up To the front desk, Please schedule this patient for a follow up appointment in the next week for an in person visit. This visit is not appropriate for telehealth so please make sure it is face to face. Thank you! [place]

## 2021-12-29 NOTE — Progress Notes (Signed)
Internal Medicine Clinic Attending  Case discussed with Dr. Ileene Musa  At the time of the visit.  We reviewed the residents history and exam and pertinent patient test results.  I agree with the assessment, diagnosis, and plan of care documented in the residents note.   Unfortunately, this was a telemed visit today, and I'd really like to see a pulse ox and listen to patient's lungs to better assess her. No major changes made today, will schedule in person visit.

## 2021-12-29 NOTE — Telephone Encounter (Signed)
Please refer to message below.  Just spoke with patient and she agreed to an appointment for next Tuesday 01/05/2022 at 8:45 am.  Yellow team was already full, so patient is scheduled with Dr. Raymondo Band.

## 2021-12-30 ENCOUNTER — Telehealth: Payer: Self-pay

## 2021-12-30 NOTE — Telephone Encounter (Signed)
Requesting to speak with a nurse about something, please call pt back.  

## 2021-12-30 NOTE — Telephone Encounter (Signed)
Return pt's call. Stated she has an appt next week and did not need anything at this time. Pt instructed to call back if there's something she needs.

## 2022-01-05 ENCOUNTER — Ambulatory Visit (INDEPENDENT_AMBULATORY_CARE_PROVIDER_SITE_OTHER): Payer: Medicare Other | Admitting: Internal Medicine

## 2022-01-05 ENCOUNTER — Observation Stay: Admission: AD | Admit: 2022-01-05 | Payer: Medicare Other | Source: Ambulatory Visit | Admitting: Internal Medicine

## 2022-01-05 VITALS — BP 135/74 | HR 87 | Temp 99.3°F | Wt 180.6 lb

## 2022-01-05 DIAGNOSIS — E118 Type 2 diabetes mellitus with unspecified complications: Secondary | ICD-10-CM | POA: Diagnosis not present

## 2022-01-05 DIAGNOSIS — J441 Chronic obstructive pulmonary disease with (acute) exacerbation: Secondary | ICD-10-CM | POA: Diagnosis not present

## 2022-01-05 DIAGNOSIS — J449 Chronic obstructive pulmonary disease, unspecified: Secondary | ICD-10-CM | POA: Diagnosis not present

## 2022-01-05 LAB — RESP PANEL BY RT-PCR (FLU A&B, COVID) ARPGX2
Influenza A by PCR: NEGATIVE
Influenza B by PCR: NEGATIVE
SARS Coronavirus 2 by RT PCR: NEGATIVE

## 2022-01-05 LAB — GLUCOSE, CAPILLARY: Glucose-Capillary: 107 mg/dL — ABNORMAL HIGH (ref 70–99)

## 2022-01-05 LAB — POCT GLYCOSYLATED HEMOGLOBIN (HGB A1C): Hemoglobin A1C: 5.5 % (ref 4.0–5.6)

## 2022-01-05 MED ORDER — METHYLPREDNISOLONE SODIUM SUCC 40 MG IJ SOLR: 40.0000 mg | Freq: Once | INTRAMUSCULAR | Status: DC

## 2022-01-05 MED ORDER — AZITHROMYCIN 250 MG PO TABS: ORAL_TABLET | ORAL | 0 refills | Status: AC

## 2022-01-05 MED ORDER — METHYLPREDNISOLONE SODIUM SUCC 125 MG IJ SOLR
62.5000 mg | Freq: Once | INTRAMUSCULAR | Status: AC
  Administered 2022-01-05: 62.5 mg via INTRAMUSCULAR

## 2022-01-05 MED ORDER — ANORO ELLIPTA 62.5-25 MCG/ACT IN AEPB: 1.0000 | INHALATION_SPRAY | Freq: Every day | RESPIRATORY_TRACT | 0 refills | Status: DC

## 2022-01-05 MED ORDER — PREDNISONE 20 MG PO TABS: 40.0000 mg | ORAL_TABLET | Freq: Every day | ORAL | 0 refills | Status: AC

## 2022-01-05 MED ORDER — ALBUTEROL SULFATE HFA 108 (90 BASE) MCG/ACT IN AERS: 1.0000 | INHALATION_SPRAY | Freq: Four times a day (QID) | RESPIRATORY_TRACT | 3 refills | Status: DC | PRN

## 2022-01-05 MED ORDER — IPRATROPIUM-ALBUTEROL 0.5-2.5 (3) MG/3ML IN SOLN
3.0000 mL | Freq: Once | RESPIRATORY_TRACT | Status: AC
  Administered 2022-01-05: 3 mL via RESPIRATORY_TRACT

## 2022-01-05 NOTE — Assessment & Plan Note (Signed)
A1c 5.5 today, same as it was in November of 2022. Continue metformin 1 g bid.

## 2022-01-05 NOTE — Progress Notes (Signed)
° °  CC: Wheezing, shortness of breath, cough  HPI:  [name] is a 71 y.o. female with COPD, diabetes, and HTN who presents to the [facility] today to follow up on her shortness of breath and cough. Please see problem-based list for further details, assessments, and plans.   Past Medical History:  Diagnosis Date   Acute sinusitis 01/09/2019   Anxiety    Arthritis    "fingers, knees" (08/16/2018)   Asthma    Atopic dermatitis 03/20/2018   Axillary hidradenitis suppurativa 10/13/2018   Cancer of right breast ([place]) 1991   s/p lumpectomy, chemotherapy and radiation therapy in 1991. Mammogram in 2007 was normal.   Constipated    h/o   COPD (chronic obstructive pulmonary disease) (HCC)    History of multiple hospital admissions for exercabation    COPD exacerbation ([place]) 01/01/2019   COPD with acute exacerbation ([place]) 01/14/2019   COPD with exacerbation ([place]) 04/06/2009   Qualifier: Diagnosis of  By: Eyvonne Mechanic MD, Vijay     Depression    Diarrhea    h/o   GERD (gastroesophageal reflux disease)    Headache    "a few times/month" (08/16/2018   Heart murmur 10/05/11   "first time I ever heard I had one was today"   History of breast cancer 12/01/2012   Pt with h/o breast CA s/p lumpectomy with chemo/radiation in 1991. Pt mammogram 2011 was unremarkable. Had CT chest 10/2012 in ED for SOB and showed spiculated nodule with lymph node. 12/04/12: Birads 2; repeat diagnostic mammogram in 1 year.     Hyperlipidemia    Hypertension    Lower extremity edema 09/21/2018   Obesity    Personal history of chemotherapy    Personal history of radiation therapy    Pneumonia    "couple times in the last 10-15 yrs" (08/16/2018)   QT prolongation 08/08/2014   Seasonal allergies 02/25/2017   Shortness of breath 10/05/11   "at rest; lying down; w/exertion"   Sigmoid diverticulitis 80/2008   Tobacco abuse    Type 2 diabetes mellitus ([place]) 05/14/2009   Type II diabetes mellitus ([place])    Review of Systems:   Review of Systems  Constitutional:  Negative for chills and fever.  HENT:  Positive for congestion and sinus pain.   Respiratory:  Positive for cough, sputum production, shortness of breath and wheezing. Negative for hemoptysis.   Cardiovascular:  Negative for chest pain and leg swelling.  Gastrointestinal:  Negative for diarrhea, nausea and vomiting.  Genitourinary: Negative.   Musculoskeletal: Negative.   Neurological:  Negative for dizziness and headaches.    Physical Exam:  Vitals:   01/05/22 0906  BP: 135/74  Pulse: 87  Temp: 99.3 F (37.4 C)  TempSrc: Oral  SpO2: 100%  Weight: 180 lb 9.6 oz (81.9 kg)   General: Acutely ill appearing female laying in bed. Appears uncomfortable. CV: RRR. No murmurs. No LE edema Pulmonary: Audible expiratory wheezing bilaterally. No crackles appreciated.  Abdominal: Soft, nontender, nondistended.  Skin: Warm and dry. No obvious rash or lesions. Neuro: A&Ox3. No focal deficit. Psych: Normal mood and affect   Assessment & Plan:   See Encounters Tab for problem based charting.  Patient seen with Dr.  Cain Sieve

## 2022-01-05 NOTE — Patient Instructions (Signed)
Thank you, [name] for allowing [place] to provide your care today. Today we discussed:  COPD: We have you on the waiting list to get a hospital bed. They will call you as soon as there is a bed available- so keep your phone on you! Additionally, I have sent in a new inhaler to your pharmacy which you should use everyday and keep using your albuterol inhaler as needed. Please also start taking prednisone and azithromycin today- you will take this for 5 days.   I have ordered the following labs for you:   Lab Orders         Resp Panel by RT-PCR (Flu A&B, Covid) Nasopharyngeal Swab         Glucose, capillary         POC Hbg A1C      Tests ordered today:  none  Referrals ordered today:   Referral Orders  No referral(s) requested today     I have ordered the following medication/changed the following medications:   Stop the following medications: Medications Discontinued During This Encounter  Medication Reason   predniSONE (DELTASONE) 20 MG tablet    albuterol (PROVENTIL HFA) 108 (90 Base) MCG/ACT inhaler Reorder     Start the following medications: Meds ordered this encounter  Medications   ipratropium-albuterol (DUONEB) 0.5-2.5 (3) MG/3ML nebulizer solution 3 mL   methylPREDNISolone sodium succinate (SOLU-MEDROL) 40 mg/mL injection 40 mg   predniSONE (DELTASONE) 20 MG tablet    Sig: Take 2 tablets (40 mg total) by mouth daily with breakfast for 5 days.    Dispense:  10 tablet    Refill:  0   azithromycin (ZITHROMAX Z-PAK) 250 MG tablet    Sig: Take 2 tablets (500 mg) on  Day 1,  followed by 1 tablet (250 mg) once daily on Days 2 through 5.    Dispense:  6 each    Refill:  0   umeclidinium-vilanterol (ANORO ELLIPTA) 62.5-25 MCG/ACT AEPB    Sig: Inhale 1 puff into the lungs daily.    Dispense:  30 each    Refill:  0   albuterol (PROVENTIL HFA) 108 (90 Base) MCG/ACT inhaler    Sig: Inhale 1-2 puffs into the lungs every 6 (six) hours as needed for wheezing or  shortness of breath.    Dispense:  18 g    Refill:  3    PHARMACIST, pls dispense whichever brand of albuterol is covered by insurance and low cost     Follow up:  1 month     Remember: The hospital will call you when there is a bed available. Please go to the emergency room if your breathing worsens or if you develop chest pain/tightness/   Should you have any questions or concerns please call the internal medicine clinic at [phone].     Buddy Duty, D.O. [place]

## 2022-01-05 NOTE — Assessment & Plan Note (Signed)
Patient presented as a 1 week follow up after having a telehealth visit last week for suspected COPD exacerbation. At that time, the patient was endorsing cough, congestion, wheezing, and shortness of breath. She was given strict return precautions and was also advised to go to the emergency department at that time for further evaluation. The patient also noted that she had only been using her albuterol, as she was unable to afford her other inhalers for her COPD.  Upon interview today, the patient states that this problem has actually be ongoing for the past 2 months and has been gradually worsening. She is short of breath while conversing with me today and has audible expiratory wheezing. Upon deep inspiration, the patient has a productive cough. Denies any fevers, chills, chest pain, n/v/d. She is acutely ill-appearing and is uncomfortable. She is saturating at 100% on room air, although is very short of breath while conversing, limiting her ability to speak in full sentences. The patient was given a DuoNeb breathing treatment and a 60 mg solumedrol IM injection while in the clinic. Her wheezing and shortness of breath improved some, but she was still not back to her baseline. COVID and flu testing also performed in the clinic and were negative. At this time, the patient would benefit from admission to the hospital for treatment of COPD exacerbation, however, there are 4 people ahead of her on the direct admission list. Discussed sending the patient home vs going to the ER at this time, and she would like to avoid going to the ER if possible. At this time, I do believe it is safe for the patient to return home and wait for a direct admission bed, as long as she picks up her new Anoro Ellipta (LAMA-LABA) inhaler, as well as prednisone and azithromycin, which she was counseled extensively on. She is also advised to continue using her albuterol prn and was given strict return precautions. The patient is in  understanding that it may take 1-3 days for a hospital bed to become available.   Plan: - Direct admission to the hospital - Start Anoro Ellipta 1 puff daily - Start prednisone 40 mg daily x 5 days - Start azithromycin 500 mg today, followed by 250 mg for days 2-5 - Albuterol prn

## 2022-01-06 ENCOUNTER — Telehealth: Payer: Self-pay | Admitting: *Deleted

## 2022-01-06 NOTE — Progress Notes (Signed)
Internal Medicine Clinic Attending  I saw and evaluated the patient.  I personally confirmed the key portions of the history and exam documented by Dr. Raymondo Band and I reviewed pertinent patient test results.  The assessment, diagnosis, and plan were formulated together and I agree with the documentation in the residents note.   Patient is here for a COPD exacerbation and needs to be directly admitted to the hospital. Flu and COVID negative, pulse ox 99% on room air.  She is very wheezy with poor air movement. She is having a hard time affording her inhalers.  In clinic today, we gave her solumedrol and a duoneb.   - direct admission to the hospital - prednisone - azithromycin - continue albuterol inhaler prn - start LAMA & LABA - resident will follow up with patient to make sure she gets these meds

## 2022-01-06 NOTE — Telephone Encounter (Signed)
Call from patient stated she called Admitting and was told that there were 79 folks in front of her waiting for a bed.  Patient stated that she is feeling better and taking her medicines and does not feel that she will get a bed anytime soon.  Patient said her sinuses are drying up now.  Will call and or go to the ER if she gets worse.  Patient said that she will come off the list.   ?

## 2022-01-11 ENCOUNTER — Other Ambulatory Visit: Payer: Self-pay

## 2022-01-11 ENCOUNTER — Ambulatory Visit (INDEPENDENT_AMBULATORY_CARE_PROVIDER_SITE_OTHER): Payer: Medicare Other | Admitting: Internal Medicine

## 2022-01-11 ENCOUNTER — Encounter: Payer: Self-pay | Admitting: Internal Medicine

## 2022-01-11 DIAGNOSIS — J329 Chronic sinusitis, unspecified: Secondary | ICD-10-CM | POA: Diagnosis not present

## 2022-01-11 DIAGNOSIS — J441 Chronic obstructive pulmonary disease with (acute) exacerbation: Secondary | ICD-10-CM | POA: Diagnosis not present

## 2022-01-11 DIAGNOSIS — J449 Chronic obstructive pulmonary disease, unspecified: Secondary | ICD-10-CM | POA: Diagnosis not present

## 2022-01-11 MED ORDER — ALBUTEROL SULFATE HFA 108 (90 BASE) MCG/ACT IN AERS: 1.0000 | INHALATION_SPRAY | Freq: Four times a day (QID) | RESPIRATORY_TRACT | 3 refills | Status: DC | PRN

## 2022-01-11 MED ORDER — NETI POT SINUS WASH 2300-700 MG NA KIT
1.0000 | PACK | Freq: Two times a day (BID) | NASAL | 0 refills | Status: DC | PRN
Start: 2022-01-11 — End: 2022-08-24

## 2022-01-11 MED ORDER — IPRATROPIUM-ALBUTEROL 0.5-2.5 (3) MG/3ML IN SOLN
3.0000 mL | Freq: Once | RESPIRATORY_TRACT | Status: AC
  Administered 2022-01-11: 3 mL via RESPIRATORY_TRACT

## 2022-01-11 MED ORDER — ANORO ELLIPTA 62.5-25 MCG/ACT IN AEPB: 1.0000 | INHALATION_SPRAY | Freq: Every day | RESPIRATORY_TRACT | 0 refills | Status: DC

## 2022-01-11 MED ORDER — PREDNISONE 20 MG PO TABS: 40.0000 mg | ORAL_TABLET | Freq: Every day | ORAL | 0 refills | Status: DC

## 2022-01-11 MED ORDER — TRELEGY ELLIPTA 100-62.5-25 MCG/ACT IN AEPB: 1.0000 | INHALATION_SPRAY | Freq: Every day | RESPIRATORY_TRACT | 0 refills | Status: DC

## 2022-01-11 NOTE — Assessment & Plan Note (Addendum)
The patient is here for follow-up after having a clinic visit on February 20 for COPD exacerbation.  At that time, it was recommended that she be admitted to the hospital after receiving breathing treatments in the clinic.  Patient was safe to return home and wait for direct admission bed, however she was unable to follow through with admission due to long wait times. ? ?The patient states that her COPD has not improved.  She continues to have wheezing, shortness of breath, and a cough which have been the same in severity.  She states that she has DuoNebs at home, which she uses up to 4 times a day.  These helped relieve her symptoms somewhat.  She does not have albuterol inhaler at home nor has she been using an anoro Ellipta inhaler since she was last seen.  She was prescribed prednisone and azithromycin, however she states that these did not help her symptoms much. Pt endorses overall frustration in dealing with her symptoms, stating she feel "tired and not like herself" and is unable to enjoy her usual activities due to her persistent symptoms. ? ?Vitals: Patient is satting 99% on room air.  Ambulatory sats 98% SpO2. ? ?Exam: Patient is audibly wheezing with mildly increased work of breathing.  Expiratory wheezing is appreciated in all lung fields.  Patient with significant coughing with deep inhalation during exam. ? ?Plan: ?Patient received DuoNeb treatment in the clinic today.  Patient states she feels somewhat better after receiving this. ? ?I have provided patient a sample of Trelegy Ellipta today.  Patient was instructed to take this once daily. ? ?Lot #  [id number] ?Exp. Date Jul 2024  ?Patient has been instructed regarding the correct time, dose, and frequency of taking this medication, including its desired effects and most common side effects. ? ?-Represcribed anoro Ellipta ?-Represcribed 5-day course of prednisone 40 mg daily ?-Represcribed albuterol rescue inhaler ? ?We will follow-up in 1 month for repeat  clinical evaluation. ? ?Addendum: Called patient back to discuss plan to only take 3 medications, Trelegy, prednisone, and albuterol for her COPD exacerbation.  Canceled anoro Ellipta as well as Spiriva that was in her chart.  Patient is aware and is amenable to this plan. ?

## 2022-01-11 NOTE — Assessment & Plan Note (Signed)
Patient is still complaining of chronic sinusitis.  During her last visit, ENT referral was suggested, however patient declined.  She states she has tried Flonase, saline spray, and Claritin for the sinuses, however this does not help alleviate her symptoms. ? ?Plan: ?Recommend Neti pot for her symptoms, sent to pharmacy today.  I also offered ENT or allergy referral for the patient, however patient declined at this time.  I told her she can let us know if she changes her mind. ?

## 2022-01-11 NOTE — Addendum Note (Signed)
Addended by: Jodean Lima on: 01/11/2022 03:29 PM ? ? Modules accepted: Orders ? ?

## 2022-01-11 NOTE — Patient Instructions (Addendum)
Thank you, [name] for allowing [place] to provide your care today. Today we discussed your COPD. ? ?Today, we have given you breathing treatments here for your symptoms.  I have given you a sample of Trelegy Ellipta. Your insurance also covers this, so I have sent this to your pharmacy. ? ?I am also giving you another 5 day course of prednisone. ? ?I am also refilling your rescue inhalers (albuterol). ? ?I am also giving you a neti pot for your sinuses.  ? ? ?I have ordered the following labs for you: ? ?Lab Orders  ?No laboratory test(s) ordered today  ?  ? ?Referrals ordered today:  ? ?Referral Orders  ?No referral(s) requested today  ?  ? ?I have ordered the following medication/changed the following medications:  ? ?Start the following medications: ?Meds ordered this encounter  ?Medications  ? ipratropium-albuterol (DUONEB) 0.5-2.5 (3) MG/3ML nebulizer solution 3 mL  ? Fluticasone-Umeclidin-Vilant (TRELEGY ELLIPTA) 100-62.5-25 MCG/ACT AEPB  ?  Sig: Inhale 1 puff into the lungs daily.  ?  Dispense:  28 each  ?  Refill:  0  ? albuterol (PROVENTIL HFA) 108 (90 Base) MCG/ACT inhaler  ?  Sig: Inhale 1-2 puffs into the lungs every 6 (six) hours as needed for wheezing or shortness of breath.  ?  Dispense:  18 g  ?  Refill:  3  ?  PHARMACIST, pls dispense whichever brand of albuterol is covered by insurance and low cost  ? predniSONE (DELTASONE) 20 MG tablet  ?  Sig: Take 2 tablets (40 mg total) by mouth daily with breakfast.  ?  Dispense:  10 tablet  ?  Refill:  0  ? DISCONTD: umeclidinium-vilanterol (ANORO ELLIPTA) 62.5-25 MCG/ACT AEPB  ?  Sig: Inhale 1 puff into the lungs daily.  ?  Dispense:  30 each  ?  Refill:  0  ? Sodium Chloride-Sodium Bicarb (NETI POT SINUS WASH) 2300-700 MG KIT  ?  Sig: Place 1 application. into the nose 2 (two) times daily as needed.  ?  Dispense:  1 kit  ?  Refill:  0  ?  ? ?Follow up:  1 month   ? ? ?Should you have any questions or concerns please call the internal medicine  clinic at [phone].   ? ? ? ?

## 2022-01-11 NOTE — Progress Notes (Signed)
? ?  CC: follow-up for COPD exacerbation ? ?HPI: ? ?[name] is a 71 y.o. with past medical history as noted below who presents to the clinic today for follow-up COPD exacerbation. Please see problem-based list for further details, assessments, and plans. ? ?Past Medical History:  ?Diagnosis Date  ? Acute sinusitis 01/09/2019  ? Anxiety   ? Arthritis   ? "fingers, knees" (08/16/2018)  ? Asthma   ? Atopic dermatitis 03/20/2018  ? Axillary hidradenitis suppurativa 10/13/2018  ? Cancer of right breast ([place]) 1991  ? s/p lumpectomy, chemotherapy and radiation therapy in 1991. Mammogram in 2007 was normal.  ? Constipated   ? h/o  ? COPD (chronic obstructive pulmonary disease) ([place])   ? History of multiple hospital admissions for exercabation   ? COPD exacerbation ([place]) 01/01/2019  ? COPD with acute exacerbation ([place]) 01/14/2019  ? COPD with exacerbation ([place]) 04/06/2009  ? Qualifier: Diagnosis of  By: Eyvonne Mechanic MD, Vijay    ? Depression   ? Diarrhea   ? h/o  ? GERD (gastroesophageal reflux disease)   ? Headache   ? "a few times/month" (08/16/2018  ? Heart murmur 10/05/11  ? "first time I ever heard I had one was today"  ? History of breast cancer 12/01/2012  ? Pt with h/o breast CA s/p lumpectomy with chemo/radiation in 1991. Pt mammogram 2011 was unremarkable. Had CT chest 10/2012 in ED for SOB and showed spiculated nodule with lymph node. 12/04/12: Birads 2; repeat diagnostic mammogram in 1 year.    ? Hyperlipidemia   ? Hypertension   ? Lower extremity edema 09/21/2018  ? Obesity   ? Personal history of chemotherapy   ? Personal history of radiation therapy   ? Pneumonia   ? "couple times in the last 10-15 yrs" (08/16/2018)  ? QT prolongation 08/08/2014  ? Seasonal allergies 02/25/2017  ? Shortness of breath 10/05/11  ? "at rest; lying down; w/exertion"  ? Sigmoid diverticulitis 80/2008  ? Tobacco abuse   ? Type 2 diabetes mellitus ([place]) 05/14/2009  ? Type II diabetes mellitus ([place])   ? ?Review of Systems: Negative aside from  that listed in individualized problem based charting. ? ?Physical Exam: ? ?Vitals:  ? 01/11/22 1325  ?BP: 134/76  ?Pulse: 89  ?Temp: 98.5 ?F (36.9 ?C)  ?TempSrc: Oral  ?SpO2: 99%  ?Height: '5\' 7"'$  (1.702 m)  ? ?General: NAD, nl appearance ?HE: Normocephalic, atraumatic, EOMI, Conjunctivae normal ?ENT: No congestion, no rhinorrhea, no exudate or erythema  ?Cardiovascular: Normal rate, regular rhythm. No murmurs, rubs, or gallops ?Pulmonary: Effort normal, breath sounds normal. No wheezes, rales, or rhonchi ?Abdominal: soft, nontender, bowel sounds present ?Musculoskeletal: no swelling, deformity, injury or tenderness in extremities ?Skin: Warm, dry, no bruising, erythema, or rash ?Psychiatric/Behavioral: normal mood, normal behavior   ? ? ?Assessment & Plan:  ? ?See Encounters Tab for problem based charting. ? ?Patient discussed with Dr. Philipp Ovens ? ?

## 2022-01-11 NOTE — Progress Notes (Signed)
Internal Medicine Clinic Attending ? ?Case discussed with Dr. Lorin Glass  At the time of the visit.  We reviewed the resident?s history and exam and pertinent patient test results.  I agree with the assessment, diagnosis, and plan of care documented in the resident?s note.  ? ?Patient with severely symptomatic COPD, which has not responded to recent treatment for exacerbation with antibiotics / prednisone. Unfortunately she has been unable to pick up anoro from her pharmacy and has been using PRN duonebs alone. In order to avoid admission and get more rapid control of symptoms, we provided her with a sample of low dose Trelegy Ellipta here in clinic. Hopefully we can de escalate to LAMA / LABA once her symptoms are more stable. I have d/c'd anoro and spiriva from her medication list.  ?

## 2022-02-04 DIAGNOSIS — Z20822 Contact with and (suspected) exposure to covid-19: Secondary | ICD-10-CM | POA: Diagnosis not present

## 2022-02-23 DIAGNOSIS — Z20822 Contact with and (suspected) exposure to covid-19: Secondary | ICD-10-CM | POA: Diagnosis not present

## 2022-02-28 ENCOUNTER — Ambulatory Visit: Payer: Self-pay | Admitting: Licensed Clinical Social Worker

## 2022-03-02 ENCOUNTER — Telehealth: Payer: Self-pay | Admitting: *Deleted

## 2022-03-02 NOTE — Chronic Care Management (AMB) (Signed)
?  Care Management  ? ?Note ? ?03/02/2022 ?Name: [name] MRN: [id number] DOB: 1951/04/30 ? ?[name] is a 71 y.o. year old female who is a primary care patient of Orvis Brill, MD. I reached out to [name] by phone today offer care coordination services.  ? ?[name] was given information about care management services today including:  ?Care management services include personalized support from designated clinical staff supervised by her physician, including individualized plan of care and coordination with other care providers ?24/7 contact phone numbers for assistance for urgent and routine care needs. ?The patient may stop care management services at any time by phone call to the office staff. ? ?Patient agreed to services and verbal consent obtained.  ? ?Follow up plan: ?Telephone appointment with care management team member scheduled for:03/04/22 ? ?Laverda Sorenson  ?Care Guide, Embedded Care Coordination ?[place]  Care Management  ?Direct Dial: [phone] ? ?

## 2022-03-04 ENCOUNTER — Ambulatory Visit: Payer: Medicare Other

## 2022-03-04 NOTE — Chronic Care Management (AMB) (Signed)
? ?  03/04/2022 ? ?[name] ?July 12, 1951 ?[id number] ? ?Successful outreach to patient this morning. Patient requested we reschedule as it was not a good time.  She shared she hurt her knees yesterday and was resting.  Patient agreed to reschedule to next Wednesday, 03/10/22 at 0945.  Patient to call clinic with any appointment needs. ?Johnney Killian, RN, BSN, CCM ?Care Management Coordinator ?[facility] ?Phone: [phone]/VEH: [phone]  ? ?

## 2022-03-10 ENCOUNTER — Ambulatory Visit: Payer: Medicare Other

## 2022-03-10 NOTE — Chronic Care Management (AMB) (Signed)
? ?  03/10/2022 ? ?[name] ?[birth year] ?[id number] ? ?Successful outreach to patient this morning.  Patient explained she fell in her garden and injured both knees.  She had to have her grandchildren pick her up as she was kneeling and could not stand.  Attempted to discuss patients medications and how she is managing her chronic medical issues but she said her husband was waiting for her and she could not talk.  Patient has been provided contact information and will call with any questions or concerns. ?Johnney Killian, RN, BSN, CCM ?Care Management Coordinator ?[facility] ?Phone: [phone]/GEZ: [phone]  ? ?

## 2022-03-11 NOTE — Patient Instructions (Signed)
Visit Information ? ?Instructions:  ? ?Patient was given the following information about care management and care coordination services today, agreed to services, and gave verbal consent: 1.care management/care coordination services include personalized support from designated clinical staff supervised by their physician, including individualized plan of care and coordination with other care providers 2. 24/7 contact phone numbers for assistance for urgent and routine care needs. 3. The patient may stop care management/care coordination services at any time by phone call to the office staff. ? ?Patient verbalizes understanding of instructions and care plan provided today and agrees to view in [place]. Active MyChart status confirmed with patient.   ? ?No further follow up required: . ? ?Milus Height, BSW , MSW ?Social Worker ?[facility] Care Management  ?[phone] ?  ? ?  ?

## 2022-03-11 NOTE — Chronic Care Management (AMB) (Signed)
?  Care Management  ? ?Social Work Visit Note ? ?12/21/2021 ?Name: [name] MRN: [id number] DOB: Jun 30, 1951 ? ?[name] is a 71 y.o. year old female who sees Lorin Glass, Eliseo Squires, MD for primary care. The care management team was consulted for assistance with care management and care coordination needs related to [name]   ? ?Patient was given the following information about care management and care coordination services today, agreed to services, and gave verbal consent: 1.care management/care coordination services include personalized support from designated clinical staff supervised by their physician, including individualized plan of care and coordination with other care providers 2. 24/7 contact phone numbers for assistance for urgent and routine care needs. 3. The patient may stop care management/care coordination services at any time by phone call to the office staff. ? ?Engaged with patient by telephone for initial visit in response to provider referral for social work chronic care management and care coordination services. ? ?Assessment: Review of patient history, allergies, and health status during evaluation of patient need for care management/care coordination services.   ? ?Interventions:  ?Patient interviewed and appropriate assessments performed ?Collaborated with clinical team regarding patient needs  ?SDOH screening completed. Patient in need of food resources. Resources emailed. Patient stated she has transportation and housing.  ?Patient discussed medications and pain level. Patient stated she already spoke with nurse and medication was sent to [facility] on [address].  ?No other needs presented. Patient will contact SW in future if needed. Patient has SW contact information.  ? ?SDOH (Social Determinants of Health) assessments performed: Yes ?SDOH Interventions   ? ?Flowsheet Row Most Recent Value  ?SDOH Interventions   ?Food Insecurity Interventions Other (Comment)   [SW email listing of food pantrys]  ? ?  ?   ? ?Plan:  ?No further follow up needed at this time.  ? ?Milus Height, BSW, MSW ?Social Worker ?[facility] Care Management  ?[phone] ?  ? ? ? ? ? ? ? ? ? ? ? ? ? ? ?

## 2022-03-12 DIAGNOSIS — Z20822 Contact with and (suspected) exposure to covid-19: Secondary | ICD-10-CM | POA: Diagnosis not present

## 2022-04-20 ENCOUNTER — Encounter: Payer: Medicare Other | Admitting: Internal Medicine

## 2022-04-21 ENCOUNTER — Encounter: Payer: Self-pay | Admitting: Internal Medicine

## 2022-04-29 ENCOUNTER — Ambulatory Visit: Payer: Self-pay

## 2022-05-01 ENCOUNTER — Encounter: Payer: Self-pay | Admitting: *Deleted

## 2022-05-17 ENCOUNTER — Other Ambulatory Visit (HOSPITAL_COMMUNITY): Payer: Self-pay

## 2022-05-18 ENCOUNTER — Other Ambulatory Visit (HOSPITAL_COMMUNITY): Payer: Self-pay

## 2022-05-21 ENCOUNTER — Ambulatory Visit (INDEPENDENT_AMBULATORY_CARE_PROVIDER_SITE_OTHER): Payer: Medicare Other | Admitting: Student

## 2022-05-21 ENCOUNTER — Other Ambulatory Visit: Payer: Self-pay

## 2022-05-21 ENCOUNTER — Encounter: Payer: Self-pay | Admitting: Student

## 2022-05-21 VITALS — BP 142/81 | HR 111 | Temp 98.3°F | Ht 66.0 in | Wt 166.5 lb

## 2022-05-21 DIAGNOSIS — F17210 Nicotine dependence, cigarettes, uncomplicated: Secondary | ICD-10-CM | POA: Diagnosis not present

## 2022-05-21 DIAGNOSIS — J449 Chronic obstructive pulmonary disease, unspecified: Secondary | ICD-10-CM

## 2022-05-21 DIAGNOSIS — J441 Chronic obstructive pulmonary disease with (acute) exacerbation: Secondary | ICD-10-CM | POA: Diagnosis not present

## 2022-05-21 MED ORDER — IPRATROPIUM-ALBUTEROL 0.5-2.5 (3) MG/3ML IN SOLN: 3.0000 mL | Freq: Four times a day (QID) | RESPIRATORY_TRACT | 1 refills | Status: DC | PRN

## 2022-05-21 MED ORDER — PREDNISONE 20 MG PO TABS: 40.0000 mg | ORAL_TABLET | Freq: Every day | ORAL | 0 refills | Status: AC

## 2022-05-21 MED ORDER — IPRATROPIUM-ALBUTEROL 0.5-2.5 (3) MG/3ML IN SOLN
3.0000 mL | Freq: Once | RESPIRATORY_TRACT | Status: AC
  Administered 2022-05-21: 3 mL via RESPIRATORY_TRACT

## 2022-05-21 MED ORDER — METHYLPREDNISOLONE ACETATE 40 MG/ML IJ SUSP
40.0000 mg | Freq: Once | INTRAMUSCULAR | Status: AC
  Administered 2022-05-21: 40 mg via INTRAMUSCULAR

## 2022-05-21 MED ORDER — AZITHROMYCIN 500 MG PO TABS: 500.0000 mg | ORAL_TABLET | Freq: Every day | ORAL | 0 refills | Status: AC

## 2022-05-21 NOTE — Assessment & Plan Note (Addendum)
Assessment: Patient presents today for worsening shortness of breath, cough for the past 2 weeks.  Her symptoms started with loss of taste and smell as well as nasal congestion.  They slowly progressed to dyspnea, and productive cough with white phlegm and wheezing.  These feel similar to COPD exacerbation she had in the past.  She denies fevers, chills, aches, chest pain.  She is 6 posterior grandson who had similar symptoms of nasal congestion not feeling well 2 weeks ago.  On physical exam she is unwell appearing.  She is breathing comfortably on room air with diffuse rhonchi with end expiratory wheezing. She is without accessory muscle use or tachypnea.  She is tachycardic with a regular rhythm and no murmurs.  There is no lower extremity swelling or JVD present.  She is resting comfortably, not in respiratory distress and saturating well on room air, I do not believe she warrants hospital admission at this time. Mild-moderate COPD exacerbation triggered by a viral infection. She had improvement of her rhonchi and wheezing with duonebs in the office. Will give one dose of IM methylpred in the clinic and prescribe 5 days of prednisone 40 mg and azithromycin 500 mg for three days. Strict return precautions given including worsening respiratory status. We will follow up in clinic next week. She continues to deal with tobacco use disorder and has been out of her maintenance inhalers for 3 months.   Plan: -Prednisone 40 mg for 4 days and azithromycin 500 mg for 3 days -follow up in clinic next week to discuss tobacco use disorder and maintenance inhaler therapy -prescription sent for duoneb PRN with home nebulizer

## 2022-05-21 NOTE — Patient Instructions (Addendum)
Thank you, Ms.[name] for allowing [place] to provide your care today. Today we discussed .    Please start taking the prednisone tomorrow and the azithromycin today. We will see you in clinic next week to follow up and get you back on inhalers. If your symptoms worsen or new symptoms occur please go to the emergency department.   I have ordered the following labs for you:  Lab Orders  No laboratory test(s) ordered today     Referrals ordered today:   Referral Orders  No referral(s) requested today     I have ordered the following medication/changed the following medications:   Stop the following medications: There are no discontinued medications.   Start the following medications: Meds ordered this encounter  Medications   ipratropium-albuterol (DUONEB) 0.5-2.5 (3) MG/3ML nebulizer solution 3 mL     Follow up:  1 week     Should you have any questions or concerns please call the internal medicine clinic at [phone].    Sanjuana Letters, D.O. [place]

## 2022-05-21 NOTE — Progress Notes (Signed)
CC: shortness of breath, cough  HPI:  [name] is a 71 y.o. female living with a history stated below and presents today for shortness of breath, cough, and wheezing. Please see problem based assessment and plan for additional details.  Past Medical History:  Diagnosis Date   Acute sinusitis 01/09/2019   Anxiety    Arthritis    "fingers, knees" (08/16/2018)   Asthma    Atopic dermatitis 03/20/2018   Axillary hidradenitis suppurativa 10/13/2018   Cancer of right breast ([place]) 1991   s/p lumpectomy, chemotherapy and radiation therapy in 1991. Mammogram in 2007 was normal.   Constipated    h/o   COPD (chronic obstructive pulmonary disease) (HCC)    History of multiple hospital admissions for exercabation    COPD exacerbation ([place]) 01/01/2019   COPD with acute exacerbation ([place]) 01/14/2019   COPD with exacerbation ([place]) 04/06/2009   Qualifier: Diagnosis of  By: Eyvonne Mechanic MD, Vijay     Depression    Diarrhea    h/o   GERD (gastroesophageal reflux disease)    Headache    "a few times/month" (08/16/2018   Heart murmur 10/05/11   "first time I ever heard I had one was today"   History of breast cancer 12/01/2012   Pt with h/o breast CA s/p lumpectomy with chemo/radiation in 1991. Pt mammogram 2011 was unremarkable. Had CT chest 10/2012 in ED for SOB and showed spiculated nodule with lymph node. 12/04/12: Birads 2; repeat diagnostic mammogram in 1 year.     Hyperlipidemia    Hypertension    Lower extremity edema 09/21/2018   Obesity    Personal history of chemotherapy    Personal history of radiation therapy    Pneumonia    "couple times in the last 10-15 yrs" (08/16/2018)   QT prolongation 08/08/2014   Seasonal allergies 02/25/2017   Shortness of breath 10/05/11   "at rest; lying down; w/exertion"   Sigmoid diverticulitis 80/2008   Tobacco abuse    Type 2 diabetes mellitus ([place]) 05/14/2009   Type II diabetes mellitus ([place])     Current Outpatient Medications on File Prior to  Visit  Medication Sig Dispense Refill   albuterol (PROVENTIL HFA) 108 (90 Base) MCG/ACT inhaler Inhale 1-2 puffs into the lungs every 6 (six) hours as needed for wheezing or shortness of breath. 18 g 3   amLODipine (NORVASC) 10 MG tablet Take 1 tablet (10 mg total) by mouth daily. 90 tablet 3   Blood Glucose Monitoring Suppl (ONETOUCH VERIO) w/Device KIT Use to check blood sugar once daily. (E11.8) 1 kit 0   DULoxetine (CYMBALTA) 60 MG capsule Take 1 capsule (60 mg total) by mouth daily. 90 capsule 3   Fluticasone Furoate 100 MCG/ACT AEPB Inhale 1 puff into the lungs daily. 30 each 0   Fluticasone-Umeclidin-Vilant (TRELEGY ELLIPTA) 100-62.5-25 MCG/ACT AEPB Inhale 1 puff into the lungs daily. 28 each 0   glucose blood (ONETOUCH VERIO) test strip Use to check blood sugar once daily. (E11.8) 100 each 11   hydrochlorothiazide (HYDRODIURIL) 25 MG tablet Take 1 tablet (25 mg total) by mouth daily. 90 tablet 3   loratadine (CLARITIN) 10 MG tablet Take 1 tablet (10 mg total) by mouth daily. 30 tablet 2   losartan (COZAAR) 100 MG tablet Take 1 tablet (100 mg total) by mouth daily. 90 tablet 3   metFORMIN (GLUCOPHAGE) 1000 MG tablet Take 1 tablet (1,000 mg total) by mouth 2 (two) times daily with a meal. 180 tablet 3  metoprolol tartrate (LOPRESSOR) 25 MG tablet Take 1 tablet (25 mg total) by mouth 2 (two) times daily. 180 tablet 3   mirtazapine (REMERON) 15 MG tablet Take 1 tablet (15 mg total) by mouth at bedtime. 30 tablet 0   OneTouch Delica Lancets 38V MISC Use to check blood sugar once daily (E11.8) 100 each 11   OVER THE COUNTER MEDICATION Take 1 Dose by mouth daily as needed (cough). Liquid cough medication     pregabalin (LYRICA) 100 MG capsule Take 1 capsule (100 mg total) by mouth 3 (three) times daily. 90 capsule 0   simvastatin (ZOCOR) 40 MG tablet Take 1 tablet (40 mg total) by mouth every evening. 90 tablet 3   Sodium Chloride-Sodium Bicarb (NETI POT SINUS WASH) 2300-700 MG KIT Place 1  application. into the nose 2 (two) times daily as needed. 1 kit 0   No current facility-administered medications on file prior to visit.    Family History  Problem Relation Age of Onset   Cancer Mother    Breast cancer Daughter 68    Social History   Socioeconomic History   Marital status: Married    Spouse name: Not on file   Number of children: Not on file   Years of education: 12   Highest education level: Not on file  Occupational History    Employer: UNEMPLOYED  Tobacco Use   Smoking status: Every Day    Packs/day: 0.50    Years: 45.00    Total pack years: 22.50    Types: Cigarettes   Smokeless tobacco: Never   Tobacco comments:    1/3/pk every 2-3 days   Vaping Use   Vaping Use: Never used  Substance and Sexual Activity   Alcohol use: Yes    Alcohol/week: 4.0 standard drinks of alcohol    Types: 4 Cans of beer per week    Comment: 08/16/2018 "weekends only"   Drug use: No   Sexual activity: Yes  Other Topics Concern   Not on file  Social History Narrative   Lived in [place] with her husband.   Takes care of 3 grand children.   Trying to find a job, has financial difficulties.      Social Determinants of Health   Financial Resource Strain: Not on file  Food Insecurity: Food Insecurity Present (03/11/2022)   Hunger Vital Sign    Worried About Running Out of Food in the Last Year: Sometimes true    Ran Out of Food in the Last Year: Sometimes true  Transportation Needs: No Transportation Needs (03/11/2022)   PRAPARE - Hydrologist (Medical): No    Lack of Transportation (Non-Medical): No  Physical Activity: Not on file  Stress: Not on file  Social Connections: Not on file  Intimate Partner Violence: Not on file    Review of Systems: ROS negative except for what is noted on the assessment and plan.  Vitals:   05/21/22 1016  BP: (!) 142/81  Pulse: (!) 111  Temp: 98.3 F (36.8 C)  TempSrc: Oral  SpO2: 100%  Weight: 166  lb 8 oz (75.5 kg)  Height: _0  (1.676 m)    Physical Exam: Constitutional: unwell appearing, but in no acute distress HENT: normocephalic atraumatic. Non-tender frontal and maxillary sinuses Eyes: conjunctiva non-erythematous Neck: supple Cardiovascular: tachycardic with regular rhythm, no m/r/g. JVD absent Pulmonary/Chest: normal work of breathing on room air, lungs with diffuse rhonchi and end expiratory wheeze MSK: normal bulk and tone  Neurological: alert & oriented x 3 Skin: warm and dry Psych: normal mood  Assessment & Plan:   COPD exacerbation ([place]) Assessment: Patient presents today for worsening shortness of breath, cough for the past 2 weeks.  Her symptoms started with loss of taste and smell as well as nasal congestion.  They slowly progressed to dyspnea, and productive cough with white phlegm and wheezing.  These feel similar to COPD exacerbation she had in the past.  She denies fevers, chills, aches, chest pain.  She is 6 posterior grandson who had similar symptoms of nasal congestion not feeling well 2 weeks ago.  On physical exam she is unwell appearing.  She is breathing comfortably on room air with diffuse rhonchi with end expiratory wheezing. She is without accessory muscle use or tachypnea.  She is tachycardic with a regular rhythm and no murmurs.  There is no lower extremity swelling or JVD present.  She is resting comfortably, not in respiratory distress and saturating well on room air, I do not believe she warrants hospital admission at this time. Mild-moderate COPD exacerbation triggered by a viral infection. She had improvement of her rhonchi and wheezing with duonebs in the office. Will give one dose of IM methylpred in the clinic and prescribe 5 days of prednisone 40 mg and azithromycin 500 mg for three days. Strict return precautions given including worsening respiratory status. We will follow up in clinic next week. She continues to deal with tobacco use disorder  and has been out of her maintenance inhalers for 3 months.   Plan: -Prednisone 40 mg for 4 days and azithromycin 500 mg for 3 days -follow up in clinic next week to discuss tobacco use disorder and maintenance inhaler therapy -prescription sent for duoneb PRN with home nebulizer   Patient seen with Dr. Delano Metz, D.O. [facility], PGY-3 Phone: [phone] Date 05/21/2022 Time 5:03 PM

## 2022-05-26 NOTE — Progress Notes (Signed)
Internal Medicine Clinic Attending  Case discussed with the resident at the time of the visit.  We reviewed the resident's history and exam and pertinent patient test results.  I agree with the assessment, diagnosis, and plan of care documented in the resident's note.  

## 2022-05-28 ENCOUNTER — Ambulatory Visit (INDEPENDENT_AMBULATORY_CARE_PROVIDER_SITE_OTHER): Payer: Medicare Other | Admitting: Student

## 2022-05-28 DIAGNOSIS — F17210 Nicotine dependence, cigarettes, uncomplicated: Secondary | ICD-10-CM | POA: Diagnosis not present

## 2022-05-28 DIAGNOSIS — F419 Anxiety disorder, unspecified: Secondary | ICD-10-CM | POA: Diagnosis not present

## 2022-05-28 DIAGNOSIS — I10 Essential (primary) hypertension: Secondary | ICD-10-CM | POA: Diagnosis not present

## 2022-05-28 DIAGNOSIS — E1142 Type 2 diabetes mellitus with diabetic polyneuropathy: Secondary | ICD-10-CM | POA: Diagnosis not present

## 2022-05-28 DIAGNOSIS — E119 Type 2 diabetes mellitus without complications: Secondary | ICD-10-CM

## 2022-05-28 DIAGNOSIS — E785 Hyperlipidemia, unspecified: Secondary | ICD-10-CM | POA: Diagnosis not present

## 2022-05-28 DIAGNOSIS — Z1211 Encounter for screening for malignant neoplasm of colon: Secondary | ICD-10-CM

## 2022-05-28 DIAGNOSIS — Z720 Tobacco use: Secondary | ICD-10-CM

## 2022-05-28 DIAGNOSIS — Z853 Personal history of malignant neoplasm of breast: Secondary | ICD-10-CM | POA: Diagnosis not present

## 2022-05-28 DIAGNOSIS — J449 Chronic obstructive pulmonary disease, unspecified: Secondary | ICD-10-CM

## 2022-05-28 DIAGNOSIS — J441 Chronic obstructive pulmonary disease with (acute) exacerbation: Secondary | ICD-10-CM

## 2022-05-28 DIAGNOSIS — E1169 Type 2 diabetes mellitus with other specified complication: Secondary | ICD-10-CM

## 2022-05-28 DIAGNOSIS — E118 Type 2 diabetes mellitus with unspecified complications: Secondary | ICD-10-CM

## 2022-05-28 MED ORDER — ALBUTEROL SULFATE HFA 108 (90 BASE) MCG/ACT IN AERS: 1.0000 | INHALATION_SPRAY | Freq: Four times a day (QID) | RESPIRATORY_TRACT | 3 refills | Status: DC | PRN

## 2022-05-28 MED ORDER — DULOXETINE HCL 60 MG PO CPEP: 60.0000 mg | ORAL_CAPSULE | Freq: Every day | ORAL | 3 refills | Status: DC

## 2022-05-28 MED ORDER — METFORMIN HCL 1000 MG PO TABS
1000.0000 mg | ORAL_TABLET | Freq: Two times a day (BID) | ORAL | 3 refills | Status: DC
Start: 2022-05-28 — End: 2022-05-31

## 2022-05-28 MED ORDER — SIMVASTATIN 40 MG PO TABS: 40.0000 mg | ORAL_TABLET | Freq: Every evening | ORAL | 3 refills | Status: DC

## 2022-05-28 MED ORDER — TRELEGY ELLIPTA 100-62.5-25 MCG/ACT IN AEPB: 1.0000 | INHALATION_SPRAY | Freq: Every day | RESPIRATORY_TRACT | 0 refills | Status: DC

## 2022-05-28 MED ORDER — PREGABALIN 100 MG PO CAPS: 100.0000 mg | ORAL_CAPSULE | Freq: Three times a day (TID) | ORAL | 0 refills | Status: DC

## 2022-05-28 MED ORDER — HYDROCHLOROTHIAZIDE 25 MG PO TABS: 25.0000 mg | ORAL_TABLET | Freq: Every day | ORAL | 3 refills | Status: DC

## 2022-05-28 MED ORDER — AMLODIPINE BESYLATE 10 MG PO TABS: 10.0000 mg | ORAL_TABLET | Freq: Every day | ORAL | 3 refills | Status: DC

## 2022-05-28 NOTE — Assessment & Plan Note (Signed)
Patient endorses improvement in her shortness of breath and cough, however she is still having periodic wheezing.  She completed her course of prednisone as well as azithromycin.  She has not been using ehr inhalers regularly. In the past she has had difficulty remembering to take her medicines and has had trouble affording inhalers, requiring samples from our clinic.   We will refill Trelegy and albuterol inhaler and have patient follow-up in next week or 2 to make sure that she is being consistent with her medications.  I discussed if any new symptoms or worsening symptoms to call our clinic immediately.

## 2022-05-28 NOTE — Assessment & Plan Note (Signed)
Discussed tobacco cessation today, patient states that she continues to smoke but is doing better.  She is not interested in nicotine supplementation therapy or other medications for this at this time.

## 2022-05-28 NOTE — Assessment & Plan Note (Signed)
Simvastatin 40 mg daily reordered today.  Last lipid profile in 2022.  LDL of 110, this is near goal for primary prevention.  We will discuss improving diet and consider repeating lipid profile next year.

## 2022-05-28 NOTE — Assessment & Plan Note (Signed)
>>  ASSESSMENT AND PLAN FOR PERSONAL HISTORY OF BREAST CANCER WRITTEN ON 05/28/2022  4:55 PM BY Riesa Pope, MD  Patient with history of breast cancer status postlumpectomy with chemoradiation and 89.  She had a mammogram in 2000 and that was unremarkable.  Most recent mammogram ordered in 2021.  Discussed follow-up mammogram today however patient is not interested in repeat at this time.  Will discuss at subsequent visits going forward.

## 2022-05-28 NOTE — Assessment & Plan Note (Signed)
Current regimen of losartan 100 mg daily, amlodipine 10 mg daily and HCTZ 25 mg daily.  Also on Lopressor 25 mg daily.  She states that she has been out of her blood pressure medicines and when she does have them she forgets to take them.  I believe she would benefit from a combination pill.  We will discuss at follow-up visit.

## 2022-05-28 NOTE — Progress Notes (Signed)
CC: Follow-up COPD exacerbation, discuss chronic medical conditions  This is a telephone encounter between [name] and [employer] on 05/28/2022 for follow-up concerning a COPD exacerbation last week as well as to discuss her chronic medical conditions and their medical management. The visit was conducted with the patient located at home and Sanjuana Letters at [facility] The patient's identity was confirmed using their DOB and current address. The patient has consented to being evaluated through a telephone encounter and understands the associated risks (an examination cannot be done and the patient may need to come in for an appointment) / benefits (allows the patient to remain at home, decreasing exposure to coronavirus). I personally spent 12 minutes on medical discussion.   HPI:  [name] is a 71 y.o. with PMH as below.   Please see A&P for assessment of the patient's acute and chronic medical conditions.   Past Medical History:  Diagnosis Date   Acute sinusitis 01/09/2019   Anxiety    Arthritis    "fingers, knees" (08/16/2018)   Asthma    Atopic dermatitis 03/20/2018   Axillary hidradenitis suppurativa 10/13/2018   Cancer of right breast ([place]) 1991   s/p lumpectomy, chemotherapy and radiation therapy in 1991. Mammogram in 2007 was normal.   Constipated    h/o   COPD (chronic obstructive pulmonary disease) (HCC)    History of multiple hospital admissions for exercabation    COPD exacerbation ([place]) 01/01/2019   COPD with acute exacerbation ([place]) 01/14/2019   COPD with exacerbation ([place]) 04/06/2009   Qualifier: Diagnosis of  By: Eyvonne Mechanic MD, Vijay     Depression    Diarrhea    h/o   GERD (gastroesophageal reflux disease)    Headache    "a few times/month" (08/16/2018   Heart murmur 10/05/11   "first time I ever heard I had one was today"   History of breast cancer 12/01/2012   Pt with h/o breast CA s/p lumpectomy with chemo/radiation in 1991. Pt mammogram  2011 was unremarkable. Had CT chest 10/2012 in ED for SOB and showed spiculated nodule with lymph node. 12/04/12: Birads 2; repeat diagnostic mammogram in 1 year.     Hyperlipidemia    Hypertension    Lower extremity edema 09/21/2018   Obesity    Personal history of chemotherapy    Personal history of radiation therapy    Pneumonia    "couple times in the last 10-15 yrs" (08/16/2018)   QT prolongation 08/08/2014   Seasonal allergies 02/25/2017   Shortness of breath 10/05/11   "at rest; lying down; w/exertion"   Sigmoid diverticulitis 80/2008   Tobacco abuse    Type 2 diabetes mellitus ([place]) 05/14/2009   Type II diabetes mellitus ([place])    Review of Systems:   Improved shortness of breath and cough Endorses bilateral foot pain consistent with diabetic neuropathy in the past    Assessment & Plan:   COPD exacerbation ([place]) Patient endorses improvement in her shortness of breath and cough, however she is still having periodic wheezing.  She completed her course of prednisone as well as azithromycin.  She has not been using ehr inhalers regularly. In the past she has had difficulty remembering to take her medicines and has had trouble affording inhalers, requiring samples from our clinic.   We will refill Trelegy and albuterol inhaler and have patient follow-up in next week or 2 to make sure that she is being consistent with her medications.  I discussed if any new  symptoms or worsening symptoms to call our clinic immediately.  Hypertension, benign essential, goal below 140/90 Current regimen of losartan 100 mg daily, amlodipine 10 mg daily and HCTZ 25 mg daily.  Also on Lopressor 25 mg daily.  She states that she has been out of her blood pressure medicines and when she does have them she forgets to take them.  I believe she would benefit from a combination pill.  We will discuss at follow-up visit.  Type 2 diabetes mellitus with complication, without long-term current use of insulin  (HCC) Last A1c of 5.5%.  Patient states that she is no longer on diabetes medication as her numbers have improved.  We discussed continuing on the metformin and repeating A1c at follow-up visit.  Dyslipidemia associated with type 2 diabetes mellitus (HCC) Simvastatin 40 mg daily reordered today.  Last lipid profile in 2022.  LDL of 110, this is near goal for primary prevention.  We will discuss improving diet and consider repeating lipid profile next year.  Personal history of breast cancer Patient with history of breast cancer status postlumpectomy with chemoradiation and 89.  She had a mammogram in 2000 and that was unremarkable.  Most recent mammogram ordered in 2021.  Discussed follow-up mammogram today however patient is not interested in repeat at this time.  Will discuss at subsequent visits going forward.  Screening for colon cancer Patient not interested in discussing colonoscopy screening at this time.  Prefers to discuss at follow-up in person.  Tobacco abuse Discussed tobacco cessation today, patient states that she continues to smoke but is doing better.  She is not interested in nicotine supplementation therapy or other medications for this at this time.  Diabetic neuropathy (HCC) Refill pregabalin today for her diabetic neuropathy.   Overall concerned that patient has poor medical literacy and has difficult time keeping up with her medications.  She uses a pillbox to help with this however she does forget to take her medicines.  Can consider combination therapies moving forward as well as home health  to help with medications if needed.  Patient has follow-up appointment with me next week.  Patient discussed with Dr. Butch Penny Internal Medicine Resident

## 2022-05-28 NOTE — Assessment & Plan Note (Signed)
Refill pregabalin today for her diabetic neuropathy.

## 2022-05-28 NOTE — Assessment & Plan Note (Addendum)
Last A1c of 5.5%.  Patient states that she is no longer on diabetes medication as her numbers have improved.  We discussed continuing on the metformin and repeating A1c at follow-up visit.

## 2022-05-28 NOTE — Assessment & Plan Note (Signed)
Patient with history of breast cancer status postlumpectomy with chemoradiation and 89.  She had a mammogram in 2000 and that was unremarkable.  Most recent mammogram ordered in 2021.  Discussed follow-up mammogram today however patient is not interested in repeat at this time.  Will discuss at subsequent visits going forward.

## 2022-05-28 NOTE — Assessment & Plan Note (Signed)
Patient not interested in discussing colonoscopy screening at this time.  Prefers to discuss at follow-up in person.

## 2022-05-31 ENCOUNTER — Encounter: Payer: Self-pay | Admitting: Student

## 2022-05-31 ENCOUNTER — Other Ambulatory Visit: Payer: Self-pay

## 2022-05-31 ENCOUNTER — Ambulatory Visit (INDEPENDENT_AMBULATORY_CARE_PROVIDER_SITE_OTHER): Payer: Medicare Other | Admitting: Student

## 2022-05-31 VITALS — BP 142/82 | HR 95 | Temp 98.6°F | Ht 67.0 in | Wt 173.9 lb

## 2022-05-31 DIAGNOSIS — Z1211 Encounter for screening for malignant neoplasm of colon: Secondary | ICD-10-CM

## 2022-05-31 DIAGNOSIS — Z853 Personal history of malignant neoplasm of breast: Secondary | ICD-10-CM

## 2022-05-31 DIAGNOSIS — J449 Chronic obstructive pulmonary disease, unspecified: Secondary | ICD-10-CM | POA: Diagnosis not present

## 2022-05-31 DIAGNOSIS — I10 Essential (primary) hypertension: Secondary | ICD-10-CM

## 2022-05-31 DIAGNOSIS — E118 Type 2 diabetes mellitus with unspecified complications: Secondary | ICD-10-CM

## 2022-05-31 DIAGNOSIS — F17210 Nicotine dependence, cigarettes, uncomplicated: Secondary | ICD-10-CM | POA: Diagnosis not present

## 2022-05-31 DIAGNOSIS — Z720 Tobacco use: Secondary | ICD-10-CM

## 2022-05-31 LAB — POCT GLYCOSYLATED HEMOGLOBIN (HGB A1C): Hemoglobin A1C: 5.7 % — AB (ref 4.0–5.6)

## 2022-05-31 LAB — GLUCOSE, CAPILLARY: Glucose-Capillary: 94 mg/dL (ref 70–99)

## 2022-05-31 NOTE — Assessment & Plan Note (Signed)
Patient states she had mammogram last year and they recommended annual screening. She declined a new referral and will wait for them to reach out to her for follow up.

## 2022-05-31 NOTE — Assessment & Plan Note (Signed)
Smoking cessation given today and discussion had how this will lead to worsening of her pulmonary disease.  She acknowledged understanding and would not like further medications at this time to assist with this.  Encouraged her to reach out to our clinic if she does.

## 2022-05-31 NOTE — Assessment & Plan Note (Signed)
>>  ASSESSMENT AND PLAN FOR CHRONIC OBSTRUCTIVE PULMONARY DISEASE WITH BRONCHOSPASM ([place]) WRITTEN ON 05/31/2022  7:03 PM BY Riesa Pope, MD  Patient presents for follow-up after COPD exacerbation which she was treated with DuoNebs, prednisone and azithromycin.  She has been out of her inhalers for some time now.  She states overall she has improvement in her shortness of breath but continues to have some symptoms.  In the past she has been unable to afford her inhalers and at this time will not be able to afford them until the end of the month.  She was given a Trelegy Ellipta sample from the clinic for 14 days.   Prescriptions sent in for trelegy ellipta and proventil 108 Home duonebs PRN Follow up in 1-2 months to make certain adherent to regimen and able to afford medications

## 2022-05-31 NOTE — Patient Instructions (Signed)
Thank you, [name] for allowing [place] to provide your care today. Today we discussed.  COPD Please use the inhaler daily and albuterol as needed.   Please pick up all of your medications from the pharmacy, if you cannot afford any please call us.     I have ordered the following labs for you:   Lab Orders         Glucose, capillary         POC Hbg A1C       Referrals ordered today:   Referral Orders  No referral(s) requested today     I have ordered the following medication/changed the following medications:   Stop the following medications: There are no discontinued medications.   Start the following medications: No orders of the defined types were placed in this encounter.    Follow up: 2-3 months    Should you have any questions or concerns please call the internal medicine clinic at [phone].    Sanjuana Letters, D.O. [place]

## 2022-05-31 NOTE — Assessment & Plan Note (Addendum)
Patient presents for follow-up after COPD exacerbation which she was treated with DuoNebs, prednisone and azithromycin.  She has been out of her inhalers for some time now.  She states overall she has improvement in her shortness of breath but continues to have some symptoms.  In the past she has been unable to afford her inhalers and at this time will not be able to afford them until the end of the month.  She was given a Trelegy Ellipta sample from the clinic for 14 days.   Prescriptions sent in for trelegy ellipta and proventil 108 Home duonebs PRN Follow up in 1-2 months to make certain adherent to regimen and able to afford medications

## 2022-05-31 NOTE — Assessment & Plan Note (Signed)
FOBT kit given to patient, she would not like to have colonoscopy at this time.  We discussed if FOBT positive then she will need to follow-up with colonoscopy which she is agreeable to.

## 2022-05-31 NOTE — Progress Notes (Signed)
CC: COPD exacerbation follow up, Diabetes follow up  HPI:  [name] is a 71 y.o. female living with a history stated below and presents today for follow up of her chronic medical conditions of COPD, T2DM. Please see problem based assessment and plan for additional details.  Past Medical History:  Diagnosis Date   Acute sinusitis 01/09/2019   Anxiety    Arthritis    "fingers, knees" (08/16/2018)   Asthma    Atopic dermatitis 03/20/2018   Axillary hidradenitis suppurativa 10/13/2018   Cancer of right breast ([place]) 1991   s/p lumpectomy, chemotherapy and radiation therapy in 1991. Mammogram in 2007 was normal.   Constipated    h/o   COPD (chronic obstructive pulmonary disease) (HCC)    History of multiple hospital admissions for exercabation    COPD exacerbation ([place]) 01/01/2019   COPD with acute exacerbation ([place]) 01/14/2019   COPD with exacerbation ([place]) 04/06/2009   Qualifier: Diagnosis of  By: Eyvonne Mechanic MD, Vijay     Depression    Diarrhea    h/o   GERD (gastroesophageal reflux disease)    Headache    "a few times/month" (08/16/2018   Heart murmur 10/05/11   "first time I ever heard I had one was today"   History of breast cancer 12/01/2012   Pt with h/o breast CA s/p lumpectomy with chemo/radiation in 1991. Pt mammogram 2011 was unremarkable. Had CT chest 10/2012 in ED for SOB and showed spiculated nodule with lymph node. 12/04/12: Birads 2; repeat diagnostic mammogram in 1 year.     Hyperlipidemia    Hypertension    Lower extremity edema 09/21/2018   Obesity    Personal history of chemotherapy    Personal history of radiation therapy    Pneumonia    "couple times in the last 10-15 yrs" (08/16/2018)   QT prolongation 08/08/2014   Seasonal allergies 02/25/2017   Shortness of breath 10/05/11   "at rest; lying down; w/exertion"   Sigmoid diverticulitis 80/2008   Tobacco abuse    Type 2 diabetes mellitus ([place]) 05/14/2009   Type II diabetes mellitus ([place])     Current  Outpatient Medications on File Prior to Visit  Medication Sig Dispense Refill   albuterol (PROVENTIL HFA) 108 (90 Base) MCG/ACT inhaler Inhale 1-2 puffs into the lungs every 6 (six) hours as needed for wheezing or shortness of breath. 18 g 3   amLODipine (NORVASC) 10 MG tablet Take 1 tablet (10 mg total) by mouth daily. 90 tablet 3   Blood Glucose Monitoring Suppl (ONETOUCH VERIO) w/Device KIT Use to check blood sugar once daily. (E11.8) 1 kit 0   DULoxetine (CYMBALTA) 60 MG capsule Take 1 capsule (60 mg total) by mouth daily. 90 capsule 3   Fluticasone-Umeclidin-Vilant (TRELEGY ELLIPTA) 100-62.5-25 MCG/ACT AEPB Inhale 1 puff into the lungs daily. 28 each 0   glucose blood (ONETOUCH VERIO) test strip Use to check blood sugar once daily. (E11.8) 100 each 11   hydrochlorothiazide (HYDRODIURIL) 25 MG tablet Take 1 tablet (25 mg total) by mouth daily. 90 tablet 3   ipratropium-albuterol (DUONEB) 0.5-2.5 (3) MG/3ML SOLN Take 3 mLs by nebulization every 6 (six) hours as needed (shortness of breath/wheezing.). 24 mL 1   loratadine (CLARITIN) 10 MG tablet Take 1 tablet (10 mg total) by mouth daily. 30 tablet 2   losartan (COZAAR) 100 MG tablet Take 1 tablet (100 mg total) by mouth daily. 90 tablet 3   metoprolol tartrate (LOPRESSOR) 25 MG tablet Take 1  tablet (25 mg total) by mouth 2 (two) times daily. 180 tablet 3   mirtazapine (REMERON) 15 MG tablet Take 1 tablet (15 mg total) by mouth at bedtime. 30 tablet 0   OneTouch Delica Lancets 14H MISC Use to check blood sugar once daily (E11.8) 100 each 11   OVER THE COUNTER MEDICATION Take 1 Dose by mouth daily as needed (cough). Liquid cough medication     pregabalin (LYRICA) 100 MG capsule Take 1 capsule (100 mg total) by mouth 3 (three) times daily. 90 capsule 0   simvastatin (ZOCOR) 40 MG tablet Take 1 tablet (40 mg total) by mouth every evening. 90 tablet 3   Sodium Chloride-Sodium Bicarb (NETI POT SINUS WASH) 2300-700 MG KIT Place 1 application. into the  nose 2 (two) times daily as needed. 1 kit 0   No current facility-administered medications on file prior to visit.    Family History  Problem Relation Age of Onset   Cancer Mother    Breast cancer Daughter 66    Social History   Socioeconomic History   Marital status: Married    Spouse name: Not on file   Number of children: Not on file   Years of education: 12   Highest education level: Not on file  Occupational History    Employer: UNEMPLOYED  Tobacco Use   Smoking status: Every Day    Packs/day: 0.50    Years: 45.00    Total pack years: 22.50    Types: Cigarettes   Smokeless tobacco: Never   Tobacco comments:    1/3/pk every 2-3 days   Vaping Use   Vaping Use: Never used  Substance and Sexual Activity   Alcohol use: Yes    Alcohol/week: 4.0 standard drinks of alcohol    Types: 4 Cans of beer per week    Comment: 08/16/2018 "weekends only"   Drug use: No   Sexual activity: Yes  Other Topics Concern   Not on file  Social History Narrative   Lived in [place] with her husband.   Takes care of 3 grand children.   Trying to find a job, has financial difficulties.      Social Determinants of Health   Financial Resource Strain: Not on file  Food Insecurity: Food Insecurity Present (03/11/2022)   Hunger Vital Sign    Worried About Running Out of Food in the Last Year: Sometimes true    Ran Out of Food in the Last Year: Sometimes true  Transportation Needs: No Transportation Needs (03/11/2022)   PRAPARE - Hydrologist (Medical): No    Lack of Transportation (Non-Medical): No  Physical Activity: Not on file  Stress: Not on file  Social Connections: Not on file  Intimate Partner Violence: Not on file    Review of Systems: ROS negative except for what is noted on the assessment and plan.  Vitals:   05/31/22 1036  BP: (!) 142/82  Pulse: 95  Temp: 98.6 F (37 C)  TempSrc: Oral  SpO2: 100%  Weight: 173 lb 14.4 oz (78.9 kg)   Height: '5\' 7"'  (1.702 m)    Physical Exam: Constitutional: in no acute distress HENT: normocephalic atraumatic, mucous membranes moist Eyes: conjunctiva non-erythematous Neck: supple Cardiovascular: regular rate and rhythm, no m/r/g Pulmonary/Chest: normal work of breathing on room air, rhonchi lung bases. Wheezing absent MSK: normal bulk and tone Neurological: alert & oriented x 3, 5/5 strength in bilateral upper and lower extremities, normal gait Skin: warm and  dry Psych: normal mood  Assessment & Plan:   Chronic obstructive pulmonary disease with bronchospasm ([place]) Patient presents for follow-up after COPD exacerbation which she was treated with DuoNebs, prednisone and azithromycin.  She has been out of her inhalers for some time now.  She states overall she has improvement in her shortness of breath but continues to have some symptoms.  In the past she has been unable to afford her inhalers and at this time will not be able to afford them until the end of the month.  She was given a Trelegy Ellipta sample from the clinic for 14 days.   Prescriptions sent in for trelegy ellipta and proventil 108 Follow up in 1-2 months to make certain adherent to regimen and able to afford medications  Personal history of breast cancer Patient states she had mammogram last year and they recommended annual screening. She declined a new referral and will wait for them to reach out to her for follow up.   Tobacco abuse Smoking cessation given today and discussion had how this will lead to worsening of her pulmonary disease.  She acknowledged understanding and would not like further medications at this time to assist with this.  Encouraged her to reach out to our clinic if she does.  Type 2 diabetes mellitus with complication, without long-term current use of insulin (HCC) Last A1c of 5.5%, today A1c of 5.7%.  Congratulated her on this.  She states she has improved her diet recently.  She has not taken  taking her metformin for the last few months and because her A1c is stable controlled we will discontinue this medication from her list.  We will continue to check A1c every 3 months and if elevated above 7 we will restart metformin.  Continue to monitor A1c every 3 months  Screening for colon cancer FOBT kit given to patient, she would not like to have colonoscopy at this time.  We discussed if FOBT positive then she will need to follow-up with colonoscopy which she is agreeable to.  Patient discussed with Dr. Delano Metz, D.O. [facility], PGY-3 Phone: [phone] Date 05/31/2022 Time 7:02 PM

## 2022-05-31 NOTE — Assessment & Plan Note (Signed)
Last A1c of 5.5%, today A1c of 5.7%.  Congratulated her on this.  She states she has improved her diet recently.  She has not taken taking her metformin for the last few months and because her A1c is stable controlled we will discontinue this medication from her list.  We will continue to check A1c every 3 months and if elevated above 7 we will restart metformin.  Continue to monitor A1c every 3 months

## 2022-05-31 NOTE — Assessment & Plan Note (Signed)
>>  ASSESSMENT AND PLAN FOR PERSONAL HISTORY OF BREAST CANCER WRITTEN ON 05/31/2022  6:58 PM BY Riesa Pope, MD  Patient states she had mammogram last year and they recommended annual screening. She declined a new referral and will wait for them to reach out to her for follow up.

## 2022-06-02 NOTE — Progress Notes (Signed)
Internal Medicine Clinic Attending  Case discussed with Dr. Katsadouros  At the time of the visit.  We reviewed the resident's history and exam and pertinent patient test results.  I agree with the assessment, diagnosis, and plan of care documented in the resident's note.  

## 2022-06-07 NOTE — Progress Notes (Signed)
Internal Medicine Clinic Attending  Case discussed with the resident at the time of the visit.  We reviewed the resident's history and exam and pertinent patient test results.  I agree with the assessment, diagnosis, and plan of care documented in the resident's note.  

## 2022-07-29 NOTE — Progress Notes (Signed)
I connected with  [name] on 07/30/22 by telephone and verified that I am speaking with the correct person using two identifiers.   I discussed the limitations of evaluation and management by telemedicine. The patient expressed understanding and agreed to proceed.  CC: Cough, sinus fullness, fatigue.  This is a telephone encounter between [name] and [name] on 07/30/2022 for cough, sinus fullness, fatigue. The visit was conducted with the patient located at home and [name] at [facility]. The patient's identity was confirmed using their DOB and current address. The patient has consented to being evaluated through a telephone encounter and understands the associated risks (an examination cannot be done and the patient may need to come in for an appointment) / benefits (allows the patient to remain at home, decreasing exposure to coronavirus). I personally spent 20 minutes on medical discussion.   HPI:  [name] is a 71 y.o. with PMH as below.   Please see A&P for assessment of the patient's acute and chronic medical conditions.   Past Medical History:  Diagnosis Date   Acute sinusitis 01/09/2019   Anxiety    Arthritis    "fingers, knees" (08/16/2018)   Asthma    Atopic dermatitis 03/20/2018   Axillary hidradenitis suppurativa 10/13/2018   Cancer of right breast ([place]) 1991   s/p lumpectomy, chemotherapy and radiation therapy in 1991. Mammogram in 2007 was normal.   Constipated    h/o   COPD (chronic obstructive pulmonary disease) (HCC)    History of multiple hospital admissions for exercabation    COPD exacerbation ([place]) 01/01/2019   COPD with acute exacerbation ([place]) 01/14/2019   COPD with exacerbation ([place]) 04/06/2009   Qualifier: Diagnosis of  By: Eyvonne Mechanic MD, Vijay     Depression    Diarrhea    h/o   GERD (gastroesophageal reflux disease)    Headache    "a few times/month" (08/16/2018   Heart murmur 10/05/11   "first time I ever heard I  had one was today"   History of breast cancer 12/01/2012   Pt with h/o breast CA s/p lumpectomy with chemo/radiation in 1991. Pt mammogram 2011 was unremarkable. Had CT chest 10/2012 in ED for SOB and showed spiculated nodule with lymph node. 12/04/12: Birads 2; repeat diagnostic mammogram in 1 year.     Hyperlipidemia    Hypertension    Lower extremity edema 09/21/2018   Obesity    Personal history of chemotherapy    Personal history of radiation therapy    Pneumonia    "couple times in the last 10-15 yrs" (08/16/2018)   QT prolongation 08/08/2014   Seasonal allergies 02/25/2017   Shortness of breath 10/05/11   "at rest; lying down; w/exertion"   Sigmoid diverticulitis 80/2008   Tobacco abuse    Type 2 diabetes mellitus ([place]) 05/14/2009   Type II diabetes mellitus (HCC)    ROS: Negative for chest pain, new or worsening shortness of breath, orthopnea, lower extremity swelling, hemoptysis, fevers   Assessment & Plan:   Chronic obstructive pulmonary disease with bronchospasm ([place]) Called in today for 2 weeks of cough, sinus fullness, fatigue.  Also feeling wheezy.  Scant colorless sputum production.  Has been using her nebulizer daily.  Has been taking dextromethorphan for cough.  Has seen little improvement in her symptoms with these measures.  Review of systems negative for chest pain, lower extremity swelling, orthopnea; fevers, hemoptysis.  Sounds congested over the phone.  I note paroxysms of coughing.  Patient's history is most consistent with an acute COPD exacerbation.  She acknowledges that this feels just like prior COPD exacerbations.  She feels well enough for treatment at home, and based on my impression of her current condition I did do not feel that she needs to be evaluated in person at this time.  She was instructed to proceed to the emergency department if her shortness of breath becomes worse.  She was instructed to call the clinic back if her condition does not improve over  the weekend.  We also talked about maintenance therapy to prevent COPD exacerbations.  She was using Trelegy samples provided at her last visit, but declined pick up the Trelegy that was ordered to her pharmacy.  She requested that I reorder it and she will speak to her pharmacist about the cost.  She was instructed to call back if Trelegy is too expensive so that we can prescribe more affordable medicine to help prevent future COPD exacerbations. - Start prednisone 40 mg daily for 5 days - Start azithromycin 500 mg daily for 3 days   Patient seen with Dr. [facility]  PGY-1 Pager: [phone] Date 07/30/2022  Time 9:56 AM

## 2022-07-30 ENCOUNTER — Ambulatory Visit (INDEPENDENT_AMBULATORY_CARE_PROVIDER_SITE_OTHER): Payer: Medicare Other | Admitting: Student

## 2022-07-30 DIAGNOSIS — Z1231 Encounter for screening mammogram for malignant neoplasm of breast: Secondary | ICD-10-CM

## 2022-07-30 DIAGNOSIS — J449 Chronic obstructive pulmonary disease, unspecified: Secondary | ICD-10-CM | POA: Diagnosis not present

## 2022-07-30 DIAGNOSIS — J441 Chronic obstructive pulmonary disease with (acute) exacerbation: Secondary | ICD-10-CM

## 2022-07-30 DIAGNOSIS — Z1211 Encounter for screening for malignant neoplasm of colon: Secondary | ICD-10-CM

## 2022-07-30 DIAGNOSIS — Z720 Tobacco use: Secondary | ICD-10-CM

## 2022-07-30 DIAGNOSIS — F17210 Nicotine dependence, cigarettes, uncomplicated: Secondary | ICD-10-CM

## 2022-07-30 DIAGNOSIS — I10 Essential (primary) hypertension: Secondary | ICD-10-CM

## 2022-07-30 MED ORDER — PREDNISONE 20 MG PO TABS: 40.0000 mg | ORAL_TABLET | Freq: Every day | ORAL | 0 refills | Status: AC

## 2022-07-30 MED ORDER — AZITHROMYCIN 500 MG PO TABS: 500.0000 mg | ORAL_TABLET | Freq: Every day | ORAL | 0 refills | Status: AC

## 2022-07-30 MED ORDER — TRELEGY ELLIPTA 100-62.5-25 MCG/ACT IN AEPB: 1.0000 | INHALATION_SPRAY | Freq: Every day | RESPIRATORY_TRACT | 0 refills | Status: DC

## 2022-07-30 NOTE — Assessment & Plan Note (Signed)
>>  ASSESSMENT AND PLAN FOR CHRONIC OBSTRUCTIVE PULMONARY DISEASE WITH BRONCHOSPASM ([place]) WRITTEN ON 07/30/2022  9:55 AM BY MCLENDON, MICHAEL, MD  Called in today for 2 weeks of cough, sinus fullness, fatigue.  Also feeling wheezy.  Scant colorless sputum production.  Has been using her nebulizer daily.  Has been taking dextromethorphan for cough.  Has seen little improvement in her symptoms with these measures.  Review of systems negative for chest pain, lower extremity swelling, orthopnea; fevers, hemoptysis.  Sounds congested over the phone.  I note paroxysms of coughing.    Patient's history is most consistent with an acute COPD exacerbation.  She acknowledges that this feels just like prior COPD exacerbations.  She feels well enough for treatment at home, and based on my impression of her current condition I did do not feel that she needs to be evaluated in person at this time.  She was instructed to proceed to the emergency department if her shortness of breath becomes worse.  She was instructed to call the clinic back if her condition does not improve over the weekend.  We also talked about maintenance therapy to prevent COPD exacerbations.  She was using Trelegy samples provided at her last visit, but declined pick up the Trelegy that was ordered to her pharmacy.  She requested that I reorder it and she will speak to her pharmacist about the cost.  She was instructed to call back if Trelegy is too expensive so that we can prescribe more affordable medicine to help prevent future COPD exacerbations. - Start prednisone 40 mg daily for 5 days - Start azithromycin 500 mg daily for 3 days

## 2022-07-30 NOTE — Assessment & Plan Note (Signed)
Called in today for 2 weeks of cough, sinus fullness, fatigue.  Also feeling wheezy.  Scant colorless sputum production.  Has been using her nebulizer daily.  Has been taking dextromethorphan for cough.  Has seen little improvement in her symptoms with these measures.  Review of systems negative for chest pain, lower extremity swelling, orthopnea; fevers, hemoptysis.  Sounds congested over the phone.  I note paroxysms of coughing.    Patient's history is most consistent with an acute COPD exacerbation.  She acknowledges that this feels just like prior COPD exacerbations.  She feels well enough for treatment at home, and based on my impression of her current condition I did do not feel that she needs to be evaluated in person at this time.  She was instructed to proceed to the emergency department if her shortness of breath becomes worse.  She was instructed to call the clinic back if her condition does not improve over the weekend.  We also talked about maintenance therapy to prevent COPD exacerbations.  She was using Trelegy samples provided at her last visit, but declined pick up the Trelegy that was ordered to her pharmacy.  She requested that I reorder it and she will speak to her pharmacist about the cost.  She was instructed to call back if Trelegy is too expensive so that we can prescribe more affordable medicine to help prevent future COPD exacerbations. - Start prednisone 40 mg daily for 5 days - Start azithromycin 500 mg daily for 3 days

## 2022-08-14 ENCOUNTER — Inpatient Hospital Stay (HOSPITAL_COMMUNITY)
Admission: EM | Admit: 2022-08-14 | Discharge: 2022-08-24 | DRG: 191 | Disposition: A | Discharge status: Home / Self Care | Payer: Medicare Other | Attending: Internal Medicine | Admitting: Internal Medicine

## 2022-08-14 ENCOUNTER — Encounter (HOSPITAL_COMMUNITY): Payer: Self-pay

## 2022-08-14 ENCOUNTER — Ambulatory Visit (HOSPITAL_COMMUNITY)
Admission: EM | Admit: 2022-08-14 | Discharge: 2022-08-14 | Disposition: A | Discharge status: Other Acute Inpatient Hospital | Payer: Medicare Other | Attending: Urgent Care | Admitting: Urgent Care

## 2022-08-14 ENCOUNTER — Other Ambulatory Visit: Payer: Self-pay

## 2022-08-14 ENCOUNTER — Emergency Department (HOSPITAL_COMMUNITY): Payer: Medicare Other

## 2022-08-14 DIAGNOSIS — E1165 Type 2 diabetes mellitus with hyperglycemia: Secondary | ICD-10-CM | POA: Diagnosis present

## 2022-08-14 DIAGNOSIS — F419 Anxiety disorder, unspecified: Secondary | ICD-10-CM

## 2022-08-14 DIAGNOSIS — B9710 Unspecified enterovirus as the cause of diseases classified elsewhere: Secondary | ICD-10-CM | POA: Diagnosis present

## 2022-08-14 DIAGNOSIS — Z7951 Long term (current) use of inhaled steroids: Secondary | ICD-10-CM | POA: Diagnosis not present

## 2022-08-14 DIAGNOSIS — B974 Respiratory syncytial virus as the cause of diseases classified elsewhere: Secondary | ICD-10-CM | POA: Diagnosis not present

## 2022-08-14 DIAGNOSIS — B348 Other viral infections of unspecified site: Secondary | ICD-10-CM | POA: Diagnosis not present

## 2022-08-14 DIAGNOSIS — E785 Hyperlipidemia, unspecified: Secondary | ICD-10-CM | POA: Diagnosis present

## 2022-08-14 DIAGNOSIS — Z923 Personal history of irradiation: Secondary | ICD-10-CM

## 2022-08-14 DIAGNOSIS — Z888 Allergy status to other drugs, medicaments and biological substances status: Secondary | ICD-10-CM | POA: Diagnosis not present

## 2022-08-14 DIAGNOSIS — J385 Laryngeal spasm: Secondary | ICD-10-CM | POA: Diagnosis present

## 2022-08-14 DIAGNOSIS — K219 Gastro-esophageal reflux disease without esophagitis: Secondary | ICD-10-CM | POA: Diagnosis present

## 2022-08-14 DIAGNOSIS — E871 Hypo-osmolality and hyponatremia: Secondary | ICD-10-CM | POA: Diagnosis present

## 2022-08-14 DIAGNOSIS — Z1152 Encounter for screening for COVID-19: Secondary | ICD-10-CM | POA: Diagnosis not present

## 2022-08-14 DIAGNOSIS — G479 Sleep disorder, unspecified: Secondary | ICD-10-CM | POA: Diagnosis present

## 2022-08-14 DIAGNOSIS — Z599 Problem related to housing and economic circumstances, unspecified: Secondary | ICD-10-CM

## 2022-08-14 DIAGNOSIS — I10 Essential (primary) hypertension: Secondary | ICD-10-CM

## 2022-08-14 DIAGNOSIS — F17210 Nicotine dependence, cigarettes, uncomplicated: Secondary | ICD-10-CM | POA: Diagnosis present

## 2022-08-14 DIAGNOSIS — F32A Depression, unspecified: Secondary | ICD-10-CM | POA: Diagnosis present

## 2022-08-14 DIAGNOSIS — J441 Chronic obstructive pulmonary disease with (acute) exacerbation: Secondary | ICD-10-CM | POA: Diagnosis not present

## 2022-08-14 DIAGNOSIS — R0602 Shortness of breath: Secondary | ICD-10-CM | POA: Diagnosis not present

## 2022-08-14 DIAGNOSIS — Z803 Family history of malignant neoplasm of breast: Secondary | ICD-10-CM | POA: Diagnosis not present

## 2022-08-14 DIAGNOSIS — B9789 Other viral agents as the cause of diseases classified elsewhere: Secondary | ICD-10-CM | POA: Diagnosis present

## 2022-08-14 DIAGNOSIS — Z853 Personal history of malignant neoplasm of breast: Secondary | ICD-10-CM

## 2022-08-14 DIAGNOSIS — J449 Chronic obstructive pulmonary disease, unspecified: Secondary | ICD-10-CM

## 2022-08-14 DIAGNOSIS — Z9221 Personal history of antineoplastic chemotherapy: Secondary | ICD-10-CM | POA: Diagnosis not present

## 2022-08-14 DIAGNOSIS — Z79899 Other long term (current) drug therapy: Secondary | ICD-10-CM | POA: Diagnosis not present

## 2022-08-14 DIAGNOSIS — T380X5A Adverse effect of glucocorticoids and synthetic analogues, initial encounter: Secondary | ICD-10-CM | POA: Diagnosis present

## 2022-08-14 DIAGNOSIS — E119 Type 2 diabetes mellitus without complications: Secondary | ICD-10-CM

## 2022-08-14 DIAGNOSIS — R062 Wheezing: Secondary | ICD-10-CM | POA: Diagnosis not present

## 2022-08-14 DIAGNOSIS — J440 Chronic obstructive pulmonary disease with acute lower respiratory infection: Secondary | ICD-10-CM | POA: Diagnosis present

## 2022-08-14 DIAGNOSIS — F411 Generalized anxiety disorder: Secondary | ICD-10-CM

## 2022-08-14 LAB — CBC WITH DIFFERENTIAL/PLATELET
Abs Immature Granulocytes: 0.01 10*3/uL (ref 0.00–0.07)
Basophils Absolute: 0 10*3/uL (ref 0.0–0.1)
Basophils Relative: 0 %
Eosinophils Absolute: 0.1 10*3/uL (ref 0.0–0.5)
Eosinophils Relative: 1 %
HCT: 36.9 % (ref 36.0–46.0)
Hemoglobin: 12.8 g/dL (ref 12.0–15.0)
Immature Granulocytes: 0 %
Lymphocytes Relative: 19 %
Lymphs Abs: 0.9 10*3/uL (ref 0.7–4.0)
MCH: 32.2 pg (ref 26.0–34.0)
MCHC: 34.7 g/dL (ref 30.0–36.0)
MCV: 92.7 fL (ref 80.0–100.0)
Monocytes Absolute: 0.4 10*3/uL (ref 0.1–1.0)
Monocytes Relative: 8 %
Neutro Abs: 3.6 10*3/uL (ref 1.7–7.7)
Neutrophils Relative %: 72 %
Platelets: 307 10*3/uL (ref 150–400)
RBC: 3.98 MIL/uL (ref 3.87–5.11)
RDW: 13.2 % (ref 11.5–15.5)
WBC: 5 10*3/uL (ref 4.0–10.5)
nRBC: 0 % (ref 0.0–0.2)

## 2022-08-14 LAB — BASIC METABOLIC PANEL
Anion gap: 10 (ref 5–15)
BUN: <5 mg/dL — ABNORMAL LOW (ref 8–23)
CO2: 22 mmol/L (ref 22–32)
Calcium: 8.6 mg/dL — ABNORMAL LOW (ref 8.9–10.3)
Chloride: 106 mmol/L (ref 98–111)
Creatinine, Ser: 0.68 mg/dL (ref 0.44–1.00)
GFR, Estimated: >60 mL/min (ref >60)
Glucose, Bld: 96 mg/dL (ref 70–99)
Potassium: 3.7 mmol/L (ref 3.5–5.1)
Sodium: 138 mmol/L (ref 135–145)

## 2022-08-14 LAB — SARS CORONAVIRUS 2 BY RT PCR: SARS Coronavirus 2 by RT PCR: NEGATIVE

## 2022-08-14 MED ORDER — ALBUTEROL SULFATE (2.5 MG/3ML) 0.083% IN NEBU
15.0000 mg/h | INHALATION_SOLUTION | RESPIRATORY_TRACT | Status: DC
  Administered 2022-08-14: 15 mg/h via RESPIRATORY_TRACT
  Filled 2022-08-14: qty 18

## 2022-08-14 MED ORDER — DM-GUAIFENESIN ER 30-600 MG PO TB12
1.0000 | ORAL_TABLET | Freq: Two times a day (BID) | ORAL | Status: DC | PRN
  Administered 2022-08-15: 1 via ORAL
  Filled 2022-08-14 (×2): qty 1

## 2022-08-14 MED ORDER — ALBUTEROL SULFATE (2.5 MG/3ML) 0.083% IN NEBU
2.5000 mg | INHALATION_SOLUTION | Freq: Four times a day (QID) | RESPIRATORY_TRACT | Status: DC | PRN
  Administered 2022-08-15 (×3): 2.5 mg via RESPIRATORY_TRACT
  Filled 2022-08-14 (×3): qty 3

## 2022-08-14 MED ORDER — PREDNISONE 20 MG PO TABS
40.0000 mg | ORAL_TABLET | Freq: Every day | ORAL | Status: DC
  Administered 2022-08-15 – 2022-08-18 (×4): 40 mg via ORAL
  Filled 2022-08-14 (×4): qty 2

## 2022-08-14 MED ORDER — MORPHINE SULFATE (PF) 4 MG/ML IV SOLN
4.0000 mg | Freq: Once | INTRAVENOUS | Status: AC
  Administered 2022-08-14: 4 mg via INTRAVENOUS
  Filled 2022-08-14: qty 1

## 2022-08-14 MED ORDER — UMECLIDINIUM-VILANTEROL 62.5-25 MCG/ACT IN AEPB
1.0000 | INHALATION_SPRAY | Freq: Every day | RESPIRATORY_TRACT | Status: DC
  Administered 2022-08-14 – 2022-08-24 (×10): 1 via RESPIRATORY_TRACT
  Filled 2022-08-14 (×2): qty 14

## 2022-08-14 MED ORDER — MORPHINE SULFATE (PF) 2 MG/ML IV SOLN
2.0000 mg | Freq: Once | INTRAVENOUS | Status: AC
  Administered 2022-08-14: 2 mg via INTRAVENOUS
  Filled 2022-08-14: qty 1

## 2022-08-14 MED ORDER — RAMELTEON 8 MG PO TABS
8.0000 mg | ORAL_TABLET | Freq: Every day | ORAL | Status: DC
  Administered 2022-08-14 – 2022-08-23 (×10): 8 mg via ORAL
  Filled 2022-08-14 (×12): qty 1

## 2022-08-14 MED ORDER — IPRATROPIUM-ALBUTEROL 0.5-2.5 (3) MG/3ML IN SOLN
RESPIRATORY_TRACT | Status: AC
  Filled 2022-08-14: qty 15

## 2022-08-14 MED ORDER — IPRATROPIUM-ALBUTEROL 0.5-2.5 (3) MG/3ML IN SOLN
RESPIRATORY_TRACT | Status: AC
  Filled 2022-08-14: qty 3

## 2022-08-14 MED ORDER — AMLODIPINE BESYLATE 10 MG PO TABS
10.0000 mg | ORAL_TABLET | Freq: Every day | ORAL | Status: DC
  Administered 2022-08-14 – 2022-08-24 (×11): 10 mg via ORAL
  Filled 2022-08-14: qty 2
  Filled 2022-08-14 (×10): qty 1

## 2022-08-14 MED ORDER — ACETAMINOPHEN 650 MG RE SUPP: 650.0000 mg | Freq: Four times a day (QID) | RECTAL | Status: DC | PRN

## 2022-08-14 MED ORDER — MAGNESIUM SULFATE 2 GM/50ML IV SOLN
2.0000 g | Freq: Once | INTRAVENOUS | Status: AC
  Administered 2022-08-14: 2 g via INTRAVENOUS
  Filled 2022-08-14: qty 50

## 2022-08-14 MED ORDER — ALBUTEROL SULFATE (2.5 MG/3ML) 0.083% IN NEBU
INHALATION_SOLUTION | RESPIRATORY_TRACT | Status: AC
  Administered 2022-08-14: 2.5 mg
  Filled 2022-08-14: qty 3

## 2022-08-14 MED ORDER — SIMVASTATIN 20 MG PO TABS
40.0000 mg | ORAL_TABLET | Freq: Every evening | ORAL | Status: DC
  Administered 2022-08-14 – 2022-08-24 (×11): 40 mg via ORAL
  Filled 2022-08-14 (×11): qty 2

## 2022-08-14 MED ORDER — ACETAMINOPHEN 325 MG PO TABS
650.0000 mg | ORAL_TABLET | Freq: Four times a day (QID) | ORAL | Status: DC | PRN
  Administered 2022-08-14 – 2022-08-23 (×3): 650 mg via ORAL
  Filled 2022-08-14 (×3): qty 2

## 2022-08-14 MED ORDER — METOCLOPRAMIDE HCL 5 MG/ML IJ SOLN
10.0000 mg | Freq: Once | INTRAMUSCULAR | Status: AC
  Administered 2022-08-14: 10 mg via INTRAVENOUS
  Filled 2022-08-14: qty 2

## 2022-08-14 MED ORDER — IPRATROPIUM-ALBUTEROL 0.5-2.5 (3) MG/3ML IN SOLN: 3.0000 mL | Freq: Once | RESPIRATORY_TRACT | Status: DC

## 2022-08-14 MED ORDER — BENZONATATE 100 MG PO CAPS
200.0000 mg | ORAL_CAPSULE | Freq: Three times a day (TID) | ORAL | Status: DC
  Administered 2022-08-14 – 2022-08-16 (×5): 200 mg via ORAL
  Filled 2022-08-14 (×5): qty 2

## 2022-08-14 MED ORDER — IPRATROPIUM BROMIDE 0.02 % IN SOLN
0.5000 mg | RESPIRATORY_TRACT | Status: DC
  Administered 2022-08-14: 0.5 mg via RESPIRATORY_TRACT
  Filled 2022-08-14: qty 2.5

## 2022-08-14 MED ORDER — ENOXAPARIN SODIUM 40 MG/0.4ML IJ SOSY
40.0000 mg | PREFILLED_SYRINGE | INTRAMUSCULAR | Status: DC
  Administered 2022-08-14 – 2022-08-24 (×11): 40 mg via SUBCUTANEOUS
  Filled 2022-08-14 (×11): qty 0.4

## 2022-08-14 NOTE — Discharge Instructions (Signed)
You appear to be having a severe COPD exacerbation.  You will need to be seen in the ER as you may need to be admitted.  [facility] link will take you to the ER now for a further workup.

## 2022-08-14 NOTE — ED Triage Notes (Signed)
Patient with c/o difficulty breathing that started yesterday. Used home nebulizer but it hasn't helped. Audible wheezing heard during triage.

## 2022-08-14 NOTE — ED Notes (Signed)
Patient is being discharged from the Urgent Care and sent to the Emergency Department via [facility] . Per Geryl Councilman, PA, patient is in need of higher level of care due to SOB and difficulty breathing. Patient is aware and verbalizes understanding of plan of care.  Vitals:   08/14/22 1252  BP: (!) 171/86  Pulse: (!) 113  Resp: (!) 24  Temp: 98.7 F (37.1 C)  SpO2: 99%

## 2022-08-14 NOTE — ED Provider Notes (Signed)
[facility]    CSN: [id number] Arrival date & time: 08/14/22  1248      History   Chief Complaint Chief Complaint  Patient presents with   Shortness of Breath    HPI [name] is a 71 y.o. female.   71 year old female with a known history of COPD and pneumonia presents today due to a 1 day history of shortness of breath.  States it started yesterday, has gotten progressively worse.  She has been hospitalized for COPD in the past, states this feels similar.  Denies known fever.  Cannot catch her breath.  States she used a nebulizer with plain albuterol roughly 1 hour ago.  She does smoke 1/2 pack per day.  She denies chest pain.  No history of blood clots. No hx of CHF, denies weight gain or edema.    Shortness of Breath   Past Medical History:  Diagnosis Date   Acute sinusitis 01/09/2019   Anxiety    Arthritis    "fingers, knees" (08/16/2018)   Asthma    Atopic dermatitis 03/20/2018   Axillary hidradenitis suppurativa 10/13/2018   Cancer of right breast ([place]) 1991   s/p lumpectomy, chemotherapy and radiation therapy in 1991. Mammogram in 2007 was normal.   Constipated    h/o   COPD (chronic obstructive pulmonary disease) (HCC)    History of multiple hospital admissions for exercabation    COPD exacerbation ([place]) 01/01/2019   COPD with acute exacerbation ([place]) 01/14/2019   COPD with exacerbation ([place]) 04/06/2009   Qualifier: Diagnosis of  By: Eyvonne Mechanic MD, Vijay     Depression    Diarrhea    h/o   GERD (gastroesophageal reflux disease)    Headache    "a few times/month" (08/16/2018   Heart murmur 10/05/11   "first time I ever heard I had one was today"   History of breast cancer 12/01/2012   Pt with h/o breast CA s/p lumpectomy with chemo/radiation in 1991. Pt mammogram 2011 was unremarkable. Had CT chest 10/2012 in ED for SOB and showed spiculated nodule with lymph node. 12/04/12: Birads 2; repeat diagnostic mammogram in 1 year.     Hyperlipidemia     Hypertension    Lower extremity edema 09/21/2018   Obesity    Personal history of chemotherapy    Personal history of radiation therapy    Pneumonia    "couple times in the last 10-15 yrs" (08/16/2018)   QT prolongation 08/08/2014   Seasonal allergies 02/25/2017   Shortness of breath 10/05/11   "at rest; lying down; w/exertion"   Sigmoid diverticulitis 80/2008   Tobacco abuse    Type 2 diabetes mellitus ([place]) 05/14/2009   Type II diabetes mellitus ([place])     Patient Active Problem List   Diagnosis Date Noted   Age-related osteoporosis without current pathological fracture  03/06/2021   Screening for colon cancer 03/06/2021   Breast nodule 04/22/2020   Chronic hip pain, right 06/22/2019   Diabetic neuropathy ([place]) 05/21/2019   Anxiety 01/09/2019   Type 2 diabetes mellitus with complication, without long-term current use of insulin ([place]) 12/26/2018   Chronic sinusitis    Chronic obstructive pulmonary disease with bronchospasm ([place]) 12/24/2014   Dyslipidemia associated with type 2 diabetes mellitus ([place]) 06/14/2014   Personal history of breast cancer 12/01/2012   Primary osteoarthritis of right knee 09/29/2012   GAD (generalized anxiety disorder) 12/30/2009   Tobacco abuse 04/06/2009   Hypertension, benign essential, goal below 140/90 04/06/2009  Past Surgical History:  Procedure Laterality Date   ABDOMINAL HYSTERECTOMY     ANTERIOR CERVICAL DECOMP/DISCECTOMY FUSION  2012   "Dr. Lynann Bologna  put plate in; did something to my vertebrae"   BACK SURGERY     BREAST LUMPECTOMY Right 1991   DOBUTAMINE STRESS ECHO  08/2004   Inferior ischemia, normal LV systolic function, no significant CAD    OB History   No obstetric history on file.      Home Medications    Prior to Admission medications   Medication Sig Start Date End Date Taking? Authorizing Provider  albuterol (PROVENTIL HFA) 108 (90 Base) MCG/ACT inhaler Inhale 1-2 puffs into the lungs every 6 (six) hours as needed for  wheezing or shortness of breath. 05/28/22   Katsadouros, Vasilios, MD  amLODipine (NORVASC) 10 MG tablet Take 1 tablet (10 mg total) by mouth daily. 05/28/22   Riesa Pope, MD  Blood Glucose Monitoring Suppl (ONETOUCH VERIO) w/Device KIT Use to check blood sugar once daily. (E11.8) 03/28/19   [name], MD  DULoxetine (CYMBALTA) 60 MG capsule Take 1 capsule (60 mg total) by mouth daily. 05/28/22   Katsadouros, Vasilios, MD  Fluticasone-Umeclidin-Vilant (TRELEGY ELLIPTA) 100-62.5-25 MCG/ACT AEPB Inhale 1 puff into the lungs daily. 07/30/22   Nani Gasser, MD  glucose blood (ONETOUCH VERIO) test strip Use to check blood sugar once daily. (E11.8) 03/28/19   [name], MD  hydrochlorothiazide (HYDRODIURIL) 25 MG tablet Take 1 tablet (25 mg total) by mouth daily. 05/28/22   Katsadouros, Vasilios, MD  ipratropium-albuterol (DUONEB) 0.5-2.5 (3) MG/3ML SOLN Take 3 mLs by nebulization every 6 (six) hours as needed (shortness of breath/wheezing.). 05/21/22   Riesa Pope, MD  loratadine (CLARITIN) 10 MG tablet Take 1 tablet (10 mg total) by mouth daily. 10/07/21 10/07/22  Orvis Brill, MD  losartan (COZAAR) 100 MG tablet Take 1 tablet (100 mg total) by mouth daily. 06/03/21   Jose Persia, MD  metoprolol tartrate (LOPRESSOR) 25 MG tablet Take 1 tablet (25 mg total) by mouth 2 (two) times daily. 05/05/21   Sanjuan Dame, MD  mirtazapine (REMERON) 15 MG tablet Take 1 tablet (15 mg total) by mouth at bedtime. 09/25/21   Jose Persia, MD  OneTouch Delica Lancets 38B MISC Use to check blood sugar once daily (E11.8) 03/28/19   [name], MD  OVER THE COUNTER MEDICATION Take 1 Dose by mouth daily as needed (cough). Liquid cough medication    [provider]  pregabalin (LYRICA) 100 MG capsule Take 1 capsule (100 mg total) by mouth 3 (three) times daily. 05/28/22   Riesa Pope, MD  simvastatin (ZOCOR) 40 MG tablet Take 1 tablet (40 mg total) by  mouth every evening. 05/28/22   Katsadouros, Vasilios, MD  Sodium Chloride-Sodium Bicarb (NETI POT SINUS [place]) 2300-700 MG KIT Place 1 application. into the nose 2 (two) times daily as needed. 01/11/22   Orvis Brill, MD    Family History Family History  Problem Relation Age of Onset   Cancer Mother    Breast cancer Daughter 80    Social History Social History   Tobacco Use   Smoking status: Every Day    Packs/day: 0.50    Years: 45.00    Total pack years: 22.50    Types: Cigarettes   Smokeless tobacco: Never   Tobacco comments:    1/3/pk every 2-3 days   Vaping Use   Vaping Use: Never used  Substance Use Topics   Alcohol use: Yes  Alcohol/week: 4.0 standard drinks of alcohol    Types: 4 Cans of beer per week    Comment: 08/16/2018 "weekends only"   Drug use: No     Allergies   Ace inhibitors   Review of Systems Review of Systems  Respiratory:  Positive for shortness of breath.   As per HPI   Physical Exam Triage Vital Signs ED Triage Vitals  Enc Vitals Group     BP 08/14/22 1252 (!) 171/86     Pulse Rate 08/14/22 1252 (!) 113     Resp 08/14/22 1252 (!) 24     Temp 08/14/22 1252 98.7 F (37.1 C)     Temp Source 08/14/22 1252 Oral     SpO2 08/14/22 1252 99 %     Weight --      Height --      Head Circumference --      Peak Flow --      Pain Score 08/14/22 1253 0     Pain Loc --      Pain Edu? --      Excl. in [place]? --    No data found.  Updated Vital Signs BP (!) 171/86 (BP Location: Right Arm)   Pulse (!) 113   Temp 98.7 F (37.1 C) (Oral)   Resp (!) 24   SpO2 99%   Visual Acuity Right Eye Distance:   Left Eye Distance:   Bilateral Distance:    Right Eye Near:   Left Eye Near:    Bilateral Near:     Physical Exam Vitals and nursing note reviewed.  Constitutional:      General: She is in acute distress.     Appearance: She is well-developed. She is not diaphoretic.  HENT:     Head: Normocephalic.  Cardiovascular:     Rate  and Rhythm: Tachycardia present.  Pulmonary:     Effort: Tachypnea, accessory muscle usage and respiratory distress present.     Breath sounds: Examination of the right-upper field reveals decreased breath sounds and wheezing. Examination of the left-upper field reveals decreased breath sounds and wheezing. Examination of the right-middle field reveals decreased breath sounds and wheezing. Examination of the left-middle field reveals decreased breath sounds and wheezing. Examination of the right-lower field reveals decreased breath sounds and wheezing. Examination of the left-lower field reveals decreased breath sounds and wheezing. Decreased breath sounds and wheezing present.  Neurological:     Mental Status: She is alert.      UC Treatments / Results  Labs (all labs ordered are listed, but only abnormal results are displayed) Labs Reviewed - No data to display  EKG   Radiology No results found.  Procedures Procedures (including critical care time)  Medications Ordered in UC Medications - No data to display  Initial Impression / Assessment and Plan / UC Course  I have reviewed the triage vital signs and the nursing notes.  Pertinent labs & imaging results that were available during my care of the patient were reviewed by me and considered in my medical decision making (see chart for details).     Shortness of breath - pt noted to be in significant shortness of breath, tachycardic, decreased breath sounds. Pt placed on non-rebreather mask in office. PT sent to ER via [facility], duoneb initiated upon transport to ER.  COPD exacerbation - as above.    Final Clinical Impressions(s) / UC Diagnoses   Final diagnoses:  Shortness of breath  COPD exacerbation ([place])  Discharge Instructions      You appear to be having a severe COPD exacerbation.  You will need to be seen in the ER as you may need to be admitted.  [facility] will take you to the ER now for a further workup.       ED Prescriptions   None    PDMP not reviewed this encounter.   Chaney Malling, [place] 08/14/22 1324

## 2022-08-14 NOTE — ED Provider Notes (Signed)
[facility] EMERGENCY DEPARTMENT Provider Note   CSN: [id number] Arrival date & time: 08/14/22  1333     History  Chief Complaint  Patient presents with   Shortness of Breath    [name] is a 71 y.o. female.  Patient is a 71 year old female with a history of hypertension, COPD with ongoing tobacco use that does not use oxygen at home, prior breast cancer no longer on chemotherapy, diabetes who is presenting today with [facility] from urgent care for shortness of breath and wheezing.  Patient reports for the last 2 days she has had worsening shortness of breath, wheezing and cough.  The cough is overall nonproductive but does have an occasional little bit of sputum with it.  She has not had fever that she is aware of.  Inhalers at home have not been working.  She went to urgent care today and they noted to her to have diffuse wheezing which did not significantly improve proved after a DuoNeb.  She was called to be transferred.  She is complaining of pain in her ribs from the coughing but denies any abdominal pain nausea or vomiting.  In route patient also received 125 mg of Solu-Medrol.  He is still complaining of feeling short of breath and wheezing on arrival.  The history is provided by the patient and medical records.  Shortness of Breath      Home Medications Prior to Admission medications   Medication Sig Start Date End Date Taking? Authorizing Provider  albuterol (PROVENTIL HFA) 108 (90 Base) MCG/ACT inhaler Inhale 1-2 puffs into the lungs every 6 (six) hours as needed for wheezing or shortness of breath. 05/28/22   Katsadouros, Vasilios, MD  amLODipine (NORVASC) 10 MG tablet Take 1 tablet (10 mg total) by mouth daily. 05/28/22   Riesa Pope, MD  Blood Glucose Monitoring Suppl (ONETOUCH VERIO) w/Device KIT Use to check blood sugar once daily. (E11.8) 03/28/19   [name], MD  DULoxetine (CYMBALTA) 60 MG capsule Take 1 capsule (60 mg total)  by mouth daily. 05/28/22   Katsadouros, Vasilios, MD  Fluticasone-Umeclidin-Vilant (TRELEGY ELLIPTA) 100-62.5-25 MCG/ACT AEPB Inhale 1 puff into the lungs daily. 07/30/22   Nani Gasser, MD  glucose blood (ONETOUCH VERIO) test strip Use to check blood sugar once daily. (E11.8) 03/28/19   [name], MD  hydrochlorothiazide (HYDRODIURIL) 25 MG tablet Take 1 tablet (25 mg total) by mouth daily. 05/28/22   Katsadouros, Vasilios, MD  ipratropium-albuterol (DUONEB) 0.5-2.5 (3) MG/3ML SOLN Take 3 mLs by nebulization every 6 (six) hours as needed (shortness of breath/wheezing.). 05/21/22   Riesa Pope, MD  loratadine (CLARITIN) 10 MG tablet Take 1 tablet (10 mg total) by mouth daily. 10/07/21 10/07/22  Orvis Brill, MD  losartan (COZAAR) 100 MG tablet Take 1 tablet (100 mg total) by mouth daily. 06/03/21   Jose Persia, MD  metoprolol tartrate (LOPRESSOR) 25 MG tablet Take 1 tablet (25 mg total) by mouth 2 (two) times daily. 05/05/21   Sanjuan Dame, MD  mirtazapine (REMERON) 15 MG tablet Take 1 tablet (15 mg total) by mouth at bedtime. 09/25/21   Jose Persia, MD  OneTouch Delica Lancets 93G MISC Use to check blood sugar once daily (E11.8) 03/28/19   [name], MD  OVER THE COUNTER MEDICATION Take 1 Dose by mouth daily as needed (cough). Liquid cough medication    [provider]  pregabalin (LYRICA) 100 MG capsule Take 1 capsule (100 mg total) by mouth 3 (three)  times daily. 05/28/22   Riesa Pope, MD  simvastatin (ZOCOR) 40 MG tablet Take 1 tablet (40 mg total) by mouth every evening. 05/28/22   Katsadouros, Vasilios, MD  Sodium Chloride-Sodium Bicarb (NETI POT SINUS [place]) 2300-700 MG KIT Place 1 application. into the nose 2 (two) times daily as needed. 01/11/22   Orvis Brill, MD      Allergies    Ace inhibitors    Review of Systems   Review of Systems  Respiratory:  Positive for shortness of breath.     Physical Exam Updated Vital  Signs BP (!) 166/71   Pulse (!) 123   Temp 97.8 F (36.6 C) (Axillary)   Resp (!) 28   SpO2 96%  Physical Exam Vitals and nursing note reviewed.  Constitutional:      General: She is in acute distress.     Appearance: She is well-developed.  HENT:     Head: Normocephalic and atraumatic.  Eyes:     Pupils: Pupils are equal, round, and reactive to light.  Cardiovascular:     Rate and Rhythm: Regular rhythm. Tachycardia present.     Heart sounds: Normal heart sounds. No murmur heard.    No friction rub.  Pulmonary:     Effort: Tachypnea and accessory muscle usage present.     Breath sounds: Wheezing present. No rales.     Comments: Diffuse wheezing.  Patient has mild accessory muscle use.  She is only able to speak in 1-2 word sentences Abdominal:     General: Bowel sounds are normal. There is no distension.     Palpations: Abdomen is soft.     Tenderness: There is no abdominal tenderness. There is no guarding or rebound.  Musculoskeletal:        General: No tenderness. Normal range of motion.     Cervical back: Normal range of motion and neck supple.     Right lower leg: No edema.     Left lower leg: No edema.     Comments: No edema  Skin:    General: Skin is warm and dry.     Findings: No rash.  Neurological:     Mental Status: She is alert and oriented to person, place, and time. Mental status is at baseline.     Cranial Nerves: No cranial nerve deficit.  Psychiatric:        Behavior: Behavior normal.     ED Results / Procedures / Treatments   Labs (all labs ordered are listed, but only abnormal results are displayed) Labs Reviewed  BASIC METABOLIC PANEL - Abnormal; Notable for the following components:      Result Value   BUN <5 (*)    Calcium 8.6 (*)    All other components within normal limits  SARS CORONAVIRUS 2 BY RT PCR  CBC WITH DIFFERENTIAL/PLATELET    EKG EKG Interpretation  Date/Time:  Saturday August 14 2022 15:31:12 EDT Ventricular Rate:   114 PR Interval:  147 QRS Duration: 91 QT Interval:  343 QTC Calculation: 473 R Axis:   37 Text Interpretation: Sinus tachycardia Ventricular premature complex Biatrial enlargement Probable anteroseptal infarct, old Minimal ST depression, anterolateral leads Confirmed by Blanchie Dessert [phone]) on 08/14/2022 3:58:30 PM  Radiology DG Chest Port 1 View  Result Date: 08/14/2022 CLINICAL DATA:  Short of breath.  Wheezing. EXAM: PORTABLE CHEST 1 VIEW COMPARISON:  11/03/2021. FINDINGS: Cardiac silhouette is normal in size. No mediastinal or hilar masses. Lungs are hyperexpanded. Mild chronic interstitial thickening  in the lower lungs. No evidence of pneumonia or pulmonary edema. No pleural effusion or pneumothorax. Right axillary vascular clips, stable. Skeletal structures are grossly intact. IMPRESSION: No acute cardiopulmonary disease. Electronically Signed   By: Lajean Manes M.D.   On: 08/14/2022 14:32    Procedures Procedures    Medications Ordered in ED Medications  albuterol (PROVENTIL) (2.5 MG/3ML) 0.083% nebulizer solution (15 mg/hr Nebulization New Bag/Given 08/14/22 1543)  metoCLOPramide (REGLAN) injection 10 mg (has no administration in time range)  magnesium sulfate IVPB 2 g 50 mL (2 g Intravenous New Bag/Given 08/14/22 1405)  morphine (PF) 2 MG/ML injection 2 mg (2 mg Intravenous Given 08/14/22 1405)  ipratropium-albuterol (DUONEB) 0.5-2.5 (3) MG/3ML nebulizer solution (  Given 08/14/22 1450)  morphine (PF) 4 MG/ML injection 4 mg (4 mg Intravenous Given 08/14/22 1503)    ED Course/ Medical Decision Making/ A&P                           Medical Decision Making Amount and/or Complexity of Data Reviewed Independent Historian: EMS External Data Reviewed: notes. Labs: ordered. Decision-making details documented in ED Course. Radiology: ordered and independent interpretation performed. Decision-making details documented in ED Course. ECG/medicine tests: ordered and independent  interpretation performed. Decision-making details documented in ED Course.  Risk Prescription drug management. Decision regarding hospitalization.   Pt with multiple medical problems and comorbidities and presenting today with a complaint that caries a high risk for morbidity and mortality.  Here today with shortness of breath.  She has already received 125 mg of Solu-Medrol and is on her second DuoNeb.  Patient denies significant infectious symptoms and lower suspicion for COVID, pneumonia.  Lower suspicion for PE, CHF, ACS or pneumothorax.  Labs and imaging are pending.  Patient will get magnesium and will continue to monitor her respiratory status.  She was placed on 2 L of oxygen for work of breathing.  She may need to go on a continuous albuterol neb if wheezing persists.  5:25 PM On repeat evaluation patient is still wheezing diffusely but work of breathing has improved some.  We will do an hour-long continuous albuterol neb.  Patient is still having bilateral rib pain from all the coughing once given a little more pain medication.  I have independently visualized and interpreted pt's images today.  Chest x-ray without evidence of pneumonia or pneumothorax today.  I independently interpreted patient's labs and EKG today and CBC and BMP are within normal limits.  EKG was sinus tachycardia but no other acute findings.  5:25 PM After an hour-long neb patient's work of breathing has improved but she continues to have persistent wheezing.  Feel that patient will need admission for further care.  She is off of oxygen at this time and sats are 94 to 96% on room air.  Will admit to the internal medicine teaching service which is her PCP.  COVID is negative.  CRITICAL CARE Performed by: Solmon Bohr Total critical care time: 30 minutes Critical care time was exclusive of separately billable procedures and treating other patients. Critical care was necessary to treat or prevent imminent or  life-threatening deterioration. Critical care was time spent personally by me on the following activities: development of treatment plan with patient and/or surrogate as well as nursing, discussions with consultants, evaluation of patient's response to treatment, examination of patient, obtaining history from patient or surrogate, ordering and performing treatments and interventions, ordering and review of laboratory studies, ordering and  review of radiographic studies, pulse oximetry and re-evaluation of patient's condition.         Final Clinical Impression(s) / ED Diagnoses Final diagnoses:  COPD exacerbation [facility])    Rx / DC Orders ED Discharge Orders     None         Blanchie Dessert, MD 08/14/22 1726

## 2022-08-14 NOTE — ED Triage Notes (Signed)
Patient from [facility] w/ [place]. COPD exacerbation. Self administered neb at home. Presented to [facility], was given a duoneb, 125 mg solumedrol.

## 2022-08-14 NOTE — H&P (Cosign Needed Addendum)
Date: 08/14/2022               Patient Name:  [name] MRN: [id number]  DOB: 08-16-1951 Age / Sex: 71 y.o., female   PCP: Nani Gasser, MD         Medical Service: Internal Medicine Teaching Service         Attending Physician: Dr. Lucious Groves, DO    First Contact: Linward Natal, MD Pager: Blima Rich [phone]  Second Contact: Rick Duff, MD Pager: [phone]       After Hours (After 5p/  First Contact Pager: [phone]  weekends / holidays): Second Contact Pager: [phone]   SUBJECTIVE   Chief Complaint: SOB  History of Present Illness: Patient is a 71 yo F with past medical history of COPD, hypertension, type 2 diabetes, hyperlipidemia, and breast cancer who presents with shortness of breath and cough.  She states she has had worsening shortness of breath and coughing over the past 2 days.  She denies any phlegm or mucus production.  She denies any fevers at home but does endorse chills.  She states her family members had a cold for the past couple of weeks and patient felt like she was getting a cold about a week ago.  She states she only uses her nebulizer when she has a symptoms and otherwise does not use any of her COPD medications regularly.  She states she does not have her albuterol inhaler.  She has used her nebulizer 2-3 times a day for the past couple days.  She cannot take Trelegy and albuterol because she is unable to afford.  She denies chest pains, vision changes.  She states she did have a brief headache 2 days ago.  Endorses rib pain due to coughing.  Denies abdominal pain, nausea, or vomiting.  Denies urinary changes.  Currently endorses 1/3 pack every 2 to 3 days.  She says she was seen in our clinic in late September and was prescribed prednisone for 5 days and azithromycin for 3 days.  She states she finished her course of these medications.  She was asymptomatic until her daughter got sick.  She states her symptoms will get worse at night.  She will be  lying down and after she sits up, she will have difficulty breathing but denies having breathing difficulties while lying flat.  She presented to urgent care because her medications were not adequately treating her symptoms.  Symptoms did not significantly improve after DuoNeb.   Of note, patient states she did not take her other medications regularly up until about a week ago when she began to take her blood pressure medications and her statin.  She last took her antihypertensives 2 days ago.  Patient requests that a maximum of 2 people examine her during rounds.  Medications:  Sometimes takes medicines This past week taken all of them.  Endorses HCTZ, amlodipline, losartan, Metoprolol, Simvastatin, Mirtazpine (when she remembers).  She states she does not take cymbalta or Lyrica.  ED Course: Patient received 125 mg of Solu-Medrol, DuoNeb, magnesium sulfate 2 g IV twice, Tessalon capsule.  Work of breathing improved some but still wheezing diffusely.  Proceeded with hour-long continuous albuterol neb.  Chest x-ray without acute cardiopulmonary disease.  EKG with sinus tachycardia.  CBC and BMP wnl.  Patient satting 94 to 96% on room air.  COVID-negative.  Meds:  No outpatient medications have been marked as taking for the 08/14/22 encounter [facility] Encounter).    Past Medical History:  Diagnosis Date   Acute sinusitis 01/09/2019   Anxiety    Arthritis    "fingers, knees" (08/16/2018)   Asthma    Atopic dermatitis 03/20/2018   Axillary hidradenitis suppurativa 10/13/2018   Cancer of right breast ([place]) 1991   s/p lumpectomy, chemotherapy and radiation therapy in 1991. Mammogram in 2007 was normal.   Constipated    h/o   COPD (chronic obstructive pulmonary disease) (HCC)    History of multiple hospital admissions for exercabation    COPD exacerbation ([place]) 01/01/2019   COPD with acute exacerbation ([place]) 01/14/2019   COPD with exacerbation ([place]) 04/06/2009   Qualifier: Diagnosis of  By:  Eyvonne Mechanic MD, Vijay     Depression    Diarrhea    h/o   GERD (gastroesophageal reflux disease)    Headache    "a few times/month" (08/16/2018   Heart murmur 10/05/11   "first time I ever heard I had one was today"   History of breast cancer 12/01/2012   Pt with h/o breast CA s/p lumpectomy with chemo/radiation in 1991. Pt mammogram 2011 was unremarkable. Had CT chest 10/2012 in ED for SOB and showed spiculated nodule with lymph node. 12/04/12: Birads 2; repeat diagnostic mammogram in 1 year.     Hyperlipidemia    Hypertension    Lower extremity edema 09/21/2018   Obesity    Personal history of chemotherapy    Personal history of radiation therapy    Pneumonia    "couple times in the last 10-15 yrs" (08/16/2018)   QT prolongation 08/08/2014   Seasonal allergies 02/25/2017   Shortness of breath 10/05/11   "at rest; lying down; w/exertion"   Sigmoid diverticulitis 80/2008   Tobacco abuse    Type 2 diabetes mellitus ([place]) 05/14/2009   Type II diabetes mellitus ([place])     Past Surgical History:  Procedure Laterality Date   ABDOMINAL HYSTERECTOMY     ANTERIOR CERVICAL DECOMP/DISCECTOMY FUSION  2012   "Dr. Lynann Bologna  put plate in; did something to my vertebrae"   BACK SURGERY     BREAST LUMPECTOMY Right 1991   DOBUTAMINE STRESS ECHO  08/2004   Inferior ischemia, normal LV systolic function, no significant CAD    Social:  Lives With: Husband and [place] Occupation: Unemployed Support: Husband, daughter Level of Function: PCP: Legrand Como [place] Substances: 1/3 pack every 2 to 3 days, about 4 cans of beer on the weekends, no other drug use  Family History: Patient's mother died from ovarian cancer, daughter diagnosed with breast cancer at age 41.  Father died from hepatitis.  Sister deceased, cause of death unknown however patient states she was on drugs.  Brother deceased, lived with hepatitis and kidney problems.  Allergies: Allergies as of 08/14/2022 - Review Complete  08/14/2022  Allergen Reaction Noted   Ace inhibitors Swelling 12/26/2014    Review of Systems: A complete ROS was negative except as per HPI.   OBJECTIVE:   Physical Exam: Blood pressure (!) 166/71, pulse (!) 123, temperature 97.8 F (36.6 C), temperature source Axillary, resp. rate (!) 28, SpO2 96 %.  Constitutional: Ill-appearing female sitting in hospital bed HENT: normocephalic atraumatic, mucous membranes moist Eyes: conjunctiva non-erythematous Neck: supple Cardiovascular: Tachycardic, regular rhythm, no m/r/g Pulmonary/Chest: Tachypneic, diffuse expiratory wheezes Abdominal: soft, non-tender, non-distended MSK: normal bulk and tone Neurological: alert & oriented x 3 Skin: warm and dry Psych: Normal mood and affect.  Labs: CBC    Component Value Date/Time   WBC 5.0 08/14/2022 1353  RBC 3.98 08/14/2022 1353   HGB 12.8 08/14/2022 1353   HGB 12.7 03/14/2019 0934   HCT 36.9 08/14/2022 1353   HCT 37.6 03/14/2019 0934   PLT 307 08/14/2022 1353   PLT 341 03/14/2019 0934   MCV 92.7 08/14/2022 1353   MCV 91 03/14/2019 0934   MCH 32.2 08/14/2022 1353   MCHC 34.7 08/14/2022 1353   RDW 13.2 08/14/2022 1353   RDW 12.5 03/14/2019 0934   LYMPHSABS 0.9 08/14/2022 1353   LYMPHSABS 2.4 03/14/2019 0934   MONOABS 0.4 08/14/2022 1353   EOSABS 0.1 08/14/2022 1353   EOSABS 0.1 03/14/2019 0934   BASOSABS 0.0 08/14/2022 1353   BASOSABS 0.0 03/14/2019 0934     CMP     Component Value Date/Time   NA 138 08/14/2022 1353   NA 143 04/22/2020 1425   K 3.7 08/14/2022 1353   CL 106 08/14/2022 1353   CO2 22 08/14/2022 1353   GLUCOSE 96 08/14/2022 1353   BUN <5 (L) 08/14/2022 1353   BUN 10 04/22/2020 1425   CREATININE 0.68 08/14/2022 1353   CREATININE 0.73 12/20/2012 1110   CALCIUM 8.6 (L) 08/14/2022 1353   PROT 6.3 (L) 03/21/2019 0810   PROT 7.1 06/21/2018 1420   ALBUMIN 3.3 (L) 03/21/2019 0810   ALBUMIN 4.4 06/21/2018 1420   AST 14 (L) 03/21/2019 0810   ALT 10 03/21/2019  0810   ALKPHOS 64 03/21/2019 0810   BILITOT 0.3 03/21/2019 0810   BILITOT 0.6 06/21/2018 1420   GFRNONAA >60 08/14/2022 1353   GFRNONAA 89 12/20/2012 1110   GFRAA 103 04/22/2020 1425   GFRAA >89 12/20/2012 1110    Imaging: DG Chest Port 1 View  Result Date: 08/14/2022 CLINICAL DATA:  Short of breath.  Wheezing. EXAM: PORTABLE CHEST 1 VIEW COMPARISON:  11/03/2021. FINDINGS: Cardiac silhouette is normal in size. No mediastinal or hilar masses. Lungs are hyperexpanded. Mild chronic interstitial thickening in the lower lungs. No evidence of pneumonia or pulmonary edema. No pleural effusion or pneumothorax. Right axillary vascular clips, stable. Skeletal structures are grossly intact. IMPRESSION: No acute cardiopulmonary disease. Electronically Signed   By: Lajean Manes M.D.   On: 08/14/2022 14:32    EKG: personally reviewed my interpretation is sinus tachycardia, PVC, no new ST elevation or depression, TWI  CXR: Personally reviewed my interpretation is no acute cardiopulmonary disease.  Increased lung volume.  ASSESSMENT & PLAN:   Assessment & Plan by Problem: Active Problems:   COPD with acute exacerbation [facility])   Mrs. Cramer is a 71 yo F with past medical history of COPD, hypertension, type 2 diabetes, hyperlipidemia, and breast cancer who presents with shortness of breath and cough admitted for COPD exacerbation on hospital day 0.  #COPD with acute exacerbation, Gold Group E Patient presents with SOB, diffuse wheezing, and acutely worsened cough in the setting of recent viral illness with history of COPD with hospitalization in the last year for exacerbation, currently smoking 1/3 pack every 2 to 3 days.  PFTs 2016 FEV1/FVC 60.  Patient prescribed Trelegy Ellipta, albuterol, and DuoNeb at home but she only uses DuoNeb.  On exam, patient is tachypneic with diffuse wheezing and increased work of breathing.  Satting well on room air.  Disease likely worsening given her continued tobacco  use and financial barriers to affording medications.  Will work with patient during admission to try and determine more cost effective medical management. -Ipratropium neb every 4 hours -Anoro Ellipta once daily -Start prednisone 40 mg p.o. daily beginning  10/8 -Encourage smoking cessation  #Hypertension Hypertensive in ED.  Will restart amlodipine given this is what she said she had been taking up until a couple days ago.  We will gradually add back home medications as needed.  She was prescribed amlodipine, metoprolol, losartan, HCTZ outpatient. -Restart amlodipine 10 mg daily  #Diabetes Last A1c 5.7 05/31/2022.  This has been controlled with diet changes.  Previously took metformin but this was discontinued earlier this year. -Monitor glucose  #Hyperlipidemia Simvastatin 40 mg daily  Diet: Normal VTE: Enoxaparin IVF: None,None Code: Full  Prior to Admission Living Arrangement: Home, living with husband Anticipated Discharge Location: Home Barriers to Discharge: Medical management  Dispo: Admit patient to Observation with expected length of stay less than 2 midnights.  Signed: Linward Natal, MD Internal Medicine Resident PGY-1  08/14/2022, 6:39 PM

## 2022-08-14 NOTE — ED Notes (Signed)
Roselyn Reef, RN charge at [facility] ED called to give report on patient.

## 2022-08-14 NOTE — Hospital Course (Addendum)
Copd exac - ongoing smoker. 2-3 days sob, coughing. UC sent her here. Diffuse wheezing. Accessory muscles. 2 duonebs, solumedrol, Mg, 1 hr alb continuous. Breathing more comfortably. Sats 94-95%. Improving but not there yet. Labs good. Xr neg. Covid neg.  Daughter had cold and has been around patient. No cold symptoms.   Started using nebulizer.   9/22 on prednisone for 5 days and azithromycin for 3 days.    Then daughter got sick and patient became symptomatic.   Day before yesterday started getting worse.  Yesterday got worse. Used nebulizer. Through the night started getting more uncomfortable. This morning said used her treatments and felt she needed to get to the hospital .    Had a cough for a day or two or cold symptoms for about a week. Coughing non productive. Patient gets dyspneic when she sits up and starts coughing. Coughing worse the past two days.  No fevers (did not take temp), had some chills,  Maybe HA one time.  No CP, no vz changes,  Does not use nebulizer every day.    Cannot take trelegy because unable to afford.  Can not afford albuterol. Sometimes gets samples for trelegy. Only uses nebs when symptoms.  2-3 x per week over the last week.    Don't always take her regular medications the way she should.    Medications:  Sometimes takes medicines This past week taken all of them. HCTz amlodipline losartan   Metoprolol '25mg'$  bid Simvastatin Mirtazpine  when she remembers  No cymbalta, lyrica   Last took medications day before yesterday.    Patient only wants minimum amount of people to examine her.    10/9  NO belly pain. Having a headache and throat discomfort with coughing so much.   Bitemporal HA. Not sleeping much.    Overdue for mammogram

## 2022-08-14 NOTE — ED Notes (Addendum)
Patient arrived coughing heavily, unable to catch her breath, with obvious wheezing heard across the room. States she had a neb treatment prior to coming here and feels like she can't breathe. Provider evaluated patient. Placed on 3L nasal cannula, given a warm blanket. [facility] notified for transport at 1300. Triage nurse brittany called report Roselyn Reef RN at [facility] ED. [facility] arriving at this time, [facility] team member requested duoneb, verified with PA it was okay to give, gave to the team. [facility] member stated he would document it. Leaving facility at 13:23

## 2022-08-15 ENCOUNTER — Other Ambulatory Visit: Payer: Self-pay | Admitting: Internal Medicine

## 2022-08-15 DIAGNOSIS — J441 Chronic obstructive pulmonary disease with (acute) exacerbation: Secondary | ICD-10-CM

## 2022-08-15 DIAGNOSIS — I10 Essential (primary) hypertension: Secondary | ICD-10-CM

## 2022-08-15 DIAGNOSIS — J449 Chronic obstructive pulmonary disease, unspecified: Secondary | ICD-10-CM

## 2022-08-15 DIAGNOSIS — F17210 Nicotine dependence, cigarettes, uncomplicated: Secondary | ICD-10-CM

## 2022-08-15 LAB — COMPREHENSIVE METABOLIC PANEL
ALT: 12 U/L (ref 0–44)
AST: 21 U/L (ref 15–41)
Albumin: 3.5 g/dL (ref 3.5–5.0)
Alkaline Phosphatase: 95 U/L (ref 38–126)
Anion gap: 13 (ref 5–15)
BUN: 5 mg/dL — ABNORMAL LOW (ref 8–23)
CO2: 24 mmol/L (ref 22–32)
Calcium: 9.8 mg/dL (ref 8.9–10.3)
Chloride: 102 mmol/L (ref 98–111)
Creatinine, Ser: 0.76 mg/dL (ref 0.44–1.00)
GFR, Estimated: >60 mL/min (ref >60)
Glucose, Bld: 125 mg/dL — ABNORMAL HIGH (ref 70–99)
Potassium: 3.6 mmol/L (ref 3.5–5.1)
Sodium: 139 mmol/L (ref 135–145)
Total Bilirubin: 0.4 mg/dL (ref 0.3–1.2)
Total Protein: 6.7 g/dL (ref 6.5–8.1)

## 2022-08-15 LAB — CBC
HCT: 37.2 % (ref 36.0–46.0)
Hemoglobin: 13 g/dL (ref 12.0–15.0)
MCH: 32.1 pg (ref 26.0–34.0)
MCHC: 34.9 g/dL (ref 30.0–36.0)
MCV: 91.9 fL (ref 80.0–100.0)
Platelets: 295 10*3/uL (ref 150–400)
RBC: 4.05 MIL/uL (ref 3.87–5.11)
RDW: 13.2 % (ref 11.5–15.5)
WBC: 3 10*3/uL — ABNORMAL LOW (ref 4.0–10.5)
nRBC: 0 % (ref 0.0–0.2)

## 2022-08-15 MED ORDER — DM-GUAIFENESIN ER 30-600 MG PO TB12
1.0000 | ORAL_TABLET | Freq: Two times a day (BID) | ORAL | Status: AC
  Administered 2022-08-15 – 2022-08-18 (×8): 1 via ORAL
  Filled 2022-08-15 (×8): qty 1

## 2022-08-15 MED ORDER — HYDROXYZINE HCL 25 MG PO TABS
25.0000 mg | ORAL_TABLET | Freq: Once | ORAL | Status: AC
  Administered 2022-08-15: 25 mg via ORAL
  Filled 2022-08-15: qty 1

## 2022-08-15 MED ORDER — IPRATROPIUM-ALBUTEROL 0.5-2.5 (3) MG/3ML IN SOLN: 3.0000 mL | RESPIRATORY_TRACT | Status: DC

## 2022-08-15 MED ORDER — DULOXETINE HCL 60 MG PO CPEP
60.0000 mg | ORAL_CAPSULE | Freq: Every day | ORAL | Status: DC
  Administered 2022-08-15 – 2022-08-24 (×10): 60 mg via ORAL
  Filled 2022-08-15 (×10): qty 1

## 2022-08-15 MED ORDER — LOSARTAN POTASSIUM 50 MG PO TABS
100.0000 mg | ORAL_TABLET | Freq: Every day | ORAL | Status: DC
  Administered 2022-08-15 – 2022-08-24 (×10): 100 mg via ORAL
  Filled 2022-08-15 (×10): qty 2

## 2022-08-15 MED ORDER — IPRATROPIUM-ALBUTEROL 0.5-2.5 (3) MG/3ML IN SOLN
3.0000 mL | RESPIRATORY_TRACT | Status: DC | PRN
  Administered 2022-08-15 – 2022-08-16 (×3): 3 mL via RESPIRATORY_TRACT
  Filled 2022-08-15 (×3): qty 3

## 2022-08-15 NOTE — Progress Notes (Signed)
I was asked for a 2nd assessment of [name] due to c/o SOB and wheezing. Helen RRT at bedside. Upon my arrival, [name] was sitting up in Fowler's position, alert, oriented and appropriate. Anxious. Endorses some SOB which is improved some since finishing recent Albuterol treatment. BBS with some expiratory wheeze even after neb. No distress. Productive cough. No fever reported. No edema. Solumedrol '125mg'$  given in route to ED and 1hr cont neb in ED.   98.62F, HR 104 ST, 159/74, RR 20 with sats 98% on RA.   Recommendations per RRT alubuterol nebs q2 and additional steroids IV.

## 2022-08-15 NOTE — Care Management Obs Status (Signed)
[facility] NOTIFICATION   Patient Details  Name: JEMMA RASP MRN: [id number] Date of Birth: 1951-03-15   Medicare Observation Status Notification Given:  Yes    Bartholomew Crews, RN 08/15/2022, 5:13 PM

## 2022-08-15 NOTE — Progress Notes (Signed)
Clinical Update  Paged for persistent coughing episodes, dyspnea, and anxiety. Went to evaluate the patient. She was sitting upright and leaning forward. She was breathing slightly fast and using some accessory muscles but this go better during our exam. She did have an episode of coughing that lasted about 20 seconds without any significant desaturation. It did cause her to become more anxious though. On exam she had diffuse mild wheezing but good air movement throughout lung fields. She had no other complaints at this time. We gave her a dose of her mucinex dm and added a one time dose of hydroxyzine 25 mg. Discussed with nursing to contact us if she does not improve in the next hour.   Johny Blamer Internal Medicine Residency, PGY 1  [phone]

## 2022-08-15 NOTE — Progress Notes (Signed)
HD#0 Subjective:   Summary: Patient is a 71 yo F with past medical history of COPD, hypertension, type 2 diabetes, hyperlipidemia, and breast cancer who presents with shortness of breath and cough.   Overnight Events: More coughing spells. Better with Mucinex. Got hydroxyzine for anxiety and improved.  Patient endorses a troubling night without sleep due to anxiety and coughing. She feels her coughing and SOB are better today.  Objective:  Vital signs in last 24 hours: Vitals:   08/14/22 1929 08/14/22 1929 08/15/22 0008 08/15/22 0411  BP:  (!) 150/76 (!) 159/74 (!) 149/77  Pulse:  88 (!) 104 99  Resp:  '16 20 14  '$ Temp:  98.2 F (36.8 C) 98.4 F (36.9 C) 97.6 F (36.4 C)  TempSrc:  Oral Oral Oral  SpO2:  99% 98% 98%  Weight: 74.8 kg      Supplemental O2: Room Air SpO2: 98 %   Physical Exam:  Constitutional: ill-appearing female sitting in hospital bed, in no acute distress HENT: normocephalic atraumatic, mucous membranes moist Eyes: conjunctiva non-erythematous Neck: supple Cardiovascular: regular rate and rhythm, no m/r/g Pulmonary/Chest: diffuse wheezing MSK: normal bulk and tone Neurological: alert & oriented x 3 Skin: warm and dry Psych: Normal mood and affect  Filed Weights   08/14/22 1929  Weight: 74.8 kg    No intake or output data in the 24 hours ending 08/15/22 0555 Net IO Since Admission: No IO data has been entered for this period [08/15/22 0555]  Pertinent Labs:    Latest Ref Rng & Units 08/14/2022    1:53 PM 11/03/2021    5:28 PM 11/03/2021    3:36 PM  CBC  WBC 4.0 - 10.5 K/uL 5.0   8.9   Hemoglobin 12.0 - 15.0 g/dL 12.8  13.6  13.1   Hematocrit 36.0 - 46.0 % 36.9  40.0  40.2   Platelets 150 - 400 K/uL 307   301        Latest Ref Rng & Units 08/14/2022    1:53 PM 11/03/2021    5:28 PM 11/03/2021    3:36 PM  CMP  Glucose 70 - 99 mg/dL 96   123   BUN 8 - 23 mg/dL <5   11   Creatinine 0.44 - 1.00 mg/dL 0.68   0.79   Sodium 135 - 145  mmol/L 138  140  141   Potassium 3.5 - 5.1 mmol/L 3.7  3.5  3.7   Chloride 98 - 111 mmol/L 106   105   CO2 22 - 32 mmol/L 22   23   Calcium 8.9 - 10.3 mg/dL 8.6   9.1     Imaging: DG Chest Port 1 View  Result Date: 08/14/2022 CLINICAL DATA:  Short of breath.  Wheezing. EXAM: PORTABLE CHEST 1 VIEW COMPARISON:  11/03/2021. FINDINGS: Cardiac silhouette is normal in size. No mediastinal or hilar masses. Lungs are hyperexpanded. Mild chronic interstitial thickening in the lower lungs. No evidence of pneumonia or pulmonary edema. No pleural effusion or pneumothorax. Right axillary vascular clips, stable. Skeletal structures are grossly intact. IMPRESSION: No acute cardiopulmonary disease. Electronically Signed   By: Lajean Manes M.D.   On: 08/14/2022 14:32    Assessment/Plan:   Active Problems:   COPD with acute exacerbation [facility])   Patient Summary: [name] is a 71 yo F with past medical history of COPD, hypertension, type 2 diabetes, hyperlipidemia, and breast cancer who presents with shortness of breath and cough admitted for COPD exacerbation  on hospital day 0.   #COPD with acute exacerbation, Gold Group E Symptoms improving. RR normal this morning. Sating well on RA. Still with coughing spells though improved with bronchodilators and Mucinex. Expect continued improvements today with prednisone. Mild fever last night likely due to subsiding viral respiratory infection. -Duoneb q4 hr prn -Anoro Ellipta once daily -prednisone 40 mg p.o. daily, last dose 10/11 -Encourage smoking cessation -Continue Mucinex BID prn -f/u RVP   #Hypertension She was prescribed amlodipine, metoprolol, losartan, HCTZ outpatient. BP improved today but not at goal.  -Continue amlodipine 10 mg daily -Restart home losartan 100 mg daily    Diet: Normal VTE: Enoxaparin IVF: None,None Code: Full   Prior to Admission Living Arrangement: Home, living with husband Anticipated Discharge Location:  Home Barriers to Discharge: Medical management   Dispo: Admit patient to Observation with expected length of stay less than 2 midnights.  Linward Natal MD Internal Medicine Resident PGY-1 Please contact the on call pager after 5 pm and on weekends at [phone].

## 2022-08-16 DIAGNOSIS — B348 Other viral infections of unspecified site: Secondary | ICD-10-CM | POA: Diagnosis not present

## 2022-08-16 DIAGNOSIS — J441 Chronic obstructive pulmonary disease with (acute) exacerbation: Secondary | ICD-10-CM | POA: Diagnosis not present

## 2022-08-16 DIAGNOSIS — F17210 Nicotine dependence, cigarettes, uncomplicated: Secondary | ICD-10-CM | POA: Diagnosis not present

## 2022-08-16 LAB — CBC
HCT: 36.5 % (ref 36.0–46.0)
Hemoglobin: 12.4 g/dL (ref 12.0–15.0)
MCH: 31.7 pg (ref 26.0–34.0)
MCHC: 34 g/dL (ref 30.0–36.0)
MCV: 93.4 fL (ref 80.0–100.0)
Platelets: 263 10*3/uL (ref 150–400)
RBC: 3.91 MIL/uL (ref 3.87–5.11)
RDW: 13.2 % (ref 11.5–15.5)
WBC: 6.1 10*3/uL (ref 4.0–10.5)
nRBC: 0 % (ref 0.0–0.2)

## 2022-08-16 LAB — BASIC METABOLIC PANEL
Anion gap: 9 (ref 5–15)
BUN: 8 mg/dL (ref 8–23)
CO2: 24 mmol/L (ref 22–32)
Calcium: 8.8 mg/dL — ABNORMAL LOW (ref 8.9–10.3)
Chloride: 105 mmol/L (ref 98–111)
Creatinine, Ser: 0.68 mg/dL (ref 0.44–1.00)
GFR, Estimated: >60 mL/min (ref >60)
Glucose, Bld: 97 mg/dL (ref 70–99)
Potassium: 3.7 mmol/L (ref 3.5–5.1)
Sodium: 138 mmol/L (ref 135–145)

## 2022-08-16 LAB — RESPIRATORY PANEL BY PCR

## 2022-08-16 MED ORDER — FLUTICASONE PROPIONATE 50 MCG/ACT NA SUSP
1.0000 | Freq: Every day | NASAL | Status: DC
  Administered 2022-08-16 – 2022-08-24 (×9): 1 via NASAL
  Filled 2022-08-16: qty 16

## 2022-08-16 MED ORDER — SALINE SPRAY 0.65 % NA SOLN: 1.0000 | NASAL | Status: DC | PRN

## 2022-08-16 MED ORDER — PHENOL 1.4 % MT LIQD
1.0000 | OROMUCOSAL | Status: DC | PRN
  Administered 2022-08-17 – 2022-08-23 (×2): 1 via OROMUCOSAL
  Filled 2022-08-16: qty 177

## 2022-08-16 MED ORDER — IPRATROPIUM-ALBUTEROL 0.5-2.5 (3) MG/3ML IN SOLN: 3.0000 mL | RESPIRATORY_TRACT | Status: DC

## 2022-08-16 MED ORDER — IPRATROPIUM-ALBUTEROL 0.5-2.5 (3) MG/3ML IN SOLN
3.0000 mL | RESPIRATORY_TRACT | Status: DC | PRN
  Administered 2022-08-16 – 2022-08-17 (×3): 3 mL via RESPIRATORY_TRACT
  Filled 2022-08-16 (×3): qty 3

## 2022-08-16 MED ORDER — HYDROCOD POLI-CHLORPHE POLI ER 10-8 MG/5ML PO SUER: 5.0000 mL | Freq: Every evening | ORAL | Status: DC | PRN

## 2022-08-16 MED ORDER — BENZONATATE 100 MG PO CAPS
200.0000 mg | ORAL_CAPSULE | Freq: Three times a day (TID) | ORAL | Status: AC
  Administered 2022-08-16 – 2022-08-19 (×9): 200 mg via ORAL
  Filled 2022-08-16 (×9): qty 2

## 2022-08-16 MED ORDER — HYDROCHLOROTHIAZIDE 25 MG PO TABS
25.0000 mg | ORAL_TABLET | Freq: Every day | ORAL | Status: DC
  Administered 2022-08-16 – 2022-08-24 (×9): 25 mg via ORAL
  Filled 2022-08-16 (×9): qty 1

## 2022-08-16 NOTE — Plan of Care (Signed)

## 2022-08-16 NOTE — Progress Notes (Signed)
HD#0 Subjective:   Summary: Patient is a 71 yo F with past medical history of COPD, hypertension, type 2 diabetes, hyperlipidemia, and breast cancer who presents with shortness of breath and cough.   Overnight Events: NAEO.  Patient continues to endorse shortness of breath, predominantly when coughing.  She still feels like she has a cold, with headaches (worse with coughing), stuffy nose, sore throat (though she attributes this to coughing).  She is upset about some of her interactions with staff concerning potential discharge.  We discussed that her discharge is solely predicated on her clinical improvement and she felt much more peaceful.  Objective:  Vital signs in last 24 hours: Vitals:   08/15/22 1931 08/16/22 0504 08/16/22 0730 08/16/22 0742  BP: (!) 168/74 (!) 156/78 (!) 157/78   Pulse: (!) 104 84 98   Resp: '16 18 18   '$ Temp: 97.8 F (36.6 C) 98.1 F (36.7 C) 98.6 F (37 C)   TempSrc: Axillary Oral Oral   SpO2: 100% 96% 95% 95%  Weight:       Supplemental O2: Room Air SpO2: 95 %   Physical Exam:  Constitutional: ill-appearing female sitting in hospital bed, in no acute distress HENT: normocephalic atraumatic, mucous membranes moist Eyes: conjunctiva non-erythematous Neck: supple Pulmonary/Chest: Diffuse inspiratory and expiratory wheezing but predominantly expiratory, prolonged expiratory phase MSK: normal bulk and tone Neurological: alert & oriented x 3 Skin: warm and dry Psych: Noticeably sad, tearful  Filed Weights   08/14/22 1929  Weight: 74.8 kg     Intake/Output Summary (Last 24 hours) at 08/16/2022 1248 Last data filed at 08/16/2022 1112 Gross per 24 hour  Intake --  Output 2100 ml  Net -2100 ml   Net IO Since Admission: -2,880 mL [08/16/22 1248]  Pertinent Labs:    Latest Ref Rng & Units 08/16/2022   12:10 AM 08/15/2022    7:00 AM 08/14/2022    1:53 PM  CBC  WBC 4.0 - 10.5 K/uL 6.1  3.0  5.0   Hemoglobin 12.0 - 15.0 g/dL 12.4  13.0  12.8    Hematocrit 36.0 - 46.0 % 36.5  37.2  36.9   Platelets 150 - 400 K/uL 263  295  307        Latest Ref Rng & Units 08/16/2022   12:10 AM 08/15/2022    7:00 AM 08/14/2022    1:53 PM  CMP  Glucose 70 - 99 mg/dL 97  125  96   BUN 8 - 23 mg/dL 8  5  <5   Creatinine 0.44 - 1.00 mg/dL 0.68  0.76  0.68   Sodium 135 - 145 mmol/L 138  139  138   Potassium 3.5 - 5.1 mmol/L 3.7  3.6  3.7   Chloride 98 - 111 mmol/L 105  102  106   CO2 22 - 32 mmol/L '24  24  22   '$ Calcium 8.9 - 10.3 mg/dL 8.8  9.8  8.6   Total Protein 6.5 - 8.1 g/dL  6.7    Total Bilirubin 0.3 - 1.2 mg/dL  0.4    Alkaline Phos 38 - 126 U/L  95    AST 15 - 41 U/L  21    ALT 0 - 44 U/L  12      Imaging: No results found.  Assessment/Plan:   Active Problems:   COPD with acute exacerbation [facility])   Patient Summary: [name] is a 71 yo F with past medical history of COPD, hypertension,  type 2 diabetes, hyperlipidemia, and breast cancer who presents with shortness of breath and cough admitted for COPD exacerbation on hospital day 0.   #COPD with acute exacerbation, Gold Group E #Viral respiratory syndrome Patient continues with diffuse wheezing, breathing difficulty, and coughing spells today.  She may be continuing to suffer from viral illness but her symptoms are also still consistent with COPD exacerbation.  She continues on her daily prednisone as well as Cammie Sickle, DuoNeb every 4 hours.  Will change this to scheduled DuoNebs. -Duoneb q4 hr scheduled -Anoro Ellipta once daily -prednisone 40 mg p.o. daily, last dose 10/11 -Encourage smoking cessation -Continue Mucinex BID prn -f/u RVP   #Hypertension She was prescribed amlodipine, metoprolol, losartan, HCTZ outpatient. Systolics 469G today. Goal <130.  -Restart HCTZ 25 mg daily -Continue amlodipine 10 mg daily -Continue home losartan 100 mg daily  #Anxiety #Sleep Difficulty -Continue Cymbalta 60 mg daily -Continue ramelteon 8 mg nightly   Diet:  Normal VTE: Enoxaparin IVF: None,None Code: Full   Prior to Admission Living Arrangement: Home, living with husband Anticipated Discharge Location: Home Barriers to Discharge: Medical management   Dispo: Admit patient to Observation with expected length of stay less than 2 midnights.  Linward Natal MD Internal Medicine Resident PGY-1 Please contact the on call pager after 5 pm and on weekends at [phone].

## 2022-08-16 NOTE — Plan of Care (Signed)
  Problem: Education: Goal: Knowledge of disease or condition will improve Outcome: Progressing Goal: Knowledge of the prescribed therapeutic regimen will improve Outcome: Progressing Goal: Individualized Educational Video(s) Outcome: Progressing   Problem: Education: Goal: Knowledge of General Education information will improve Description: Including pain rating scale, medication(s)/side effects and non-pharmacologic comfort measures Outcome: Progressing   Problem: Clinical Measurements: Goal: Ability to maintain clinical measurements within normal limits will improve Outcome: Progressing   Problem: Activity: Goal: Risk for activity intolerance will decrease Outcome: Progressing   Problem: Nutrition: Goal: Adequate nutrition will be maintained Outcome: Progressing   Problem: Safety: Goal: Ability to remain free from injury will improve Outcome: Progressing

## 2022-08-17 LAB — BASIC METABOLIC PANEL
Anion gap: 9 (ref 5–15)
BUN: 9 mg/dL (ref 8–23)
CO2: 29 mmol/L (ref 22–32)
Calcium: 8.8 mg/dL — ABNORMAL LOW (ref 8.9–10.3)
Chloride: 98 mmol/L (ref 98–111)
Creatinine, Ser: 0.71 mg/dL (ref 0.44–1.00)
GFR, Estimated: >60 mL/min (ref >60)
Glucose, Bld: 103 mg/dL — ABNORMAL HIGH (ref 70–99)
Potassium: 3.4 mmol/L — ABNORMAL LOW (ref 3.5–5.1)
Sodium: 136 mmol/L (ref 135–145)

## 2022-08-17 LAB — CBC
HCT: 38 % (ref 36.0–46.0)
Hemoglobin: 13.4 g/dL (ref 12.0–15.0)
MCH: 32.1 pg (ref 26.0–34.0)
MCHC: 35.3 g/dL (ref 30.0–36.0)
MCV: 90.9 fL (ref 80.0–100.0)
Platelets: 309 10*3/uL (ref 150–400)
RBC: 4.18 MIL/uL (ref 3.87–5.11)
RDW: 13 % (ref 11.5–15.5)
WBC: 9 10*3/uL (ref 4.0–10.5)
nRBC: 0 % (ref 0.0–0.2)

## 2022-08-17 MED ORDER — HYDROCOD POLI-CHLORPHE POLI ER 10-8 MG/5ML PO SUER: 5.0000 mL | Freq: Two times a day (BID) | ORAL | Status: DC | PRN

## 2022-08-17 MED ORDER — ALBUTEROL SULFATE (2.5 MG/3ML) 0.083% IN NEBU
2.5000 mg | INHALATION_SOLUTION | Freq: Four times a day (QID) | RESPIRATORY_TRACT | Status: DC | PRN
  Filled 2022-08-17: qty 3

## 2022-08-17 MED ORDER — HYDROCOD POLI-CHLORPHE POLI ER 10-8 MG/5ML PO SUER
5.0000 mL | Freq: Once | ORAL | Status: AC
  Administered 2022-08-17: 5 mL via ORAL
  Filled 2022-08-17: qty 5

## 2022-08-17 MED ORDER — IPRATROPIUM-ALBUTEROL 0.5-2.5 (3) MG/3ML IN SOLN: 3.0000 mL | RESPIRATORY_TRACT | Status: DC

## 2022-08-17 MED ORDER — ALBUTEROL SULFATE (2.5 MG/3ML) 0.083% IN NEBU
2.5000 mg | INHALATION_SOLUTION | RESPIRATORY_TRACT | Status: DC | PRN
  Administered 2022-08-18 – 2022-08-24 (×9): 2.5 mg via RESPIRATORY_TRACT
  Filled 2022-08-17 (×8): qty 3

## 2022-08-17 MED ORDER — IPRATROPIUM-ALBUTEROL 0.5-2.5 (3) MG/3ML IN SOLN
3.0000 mL | RESPIRATORY_TRACT | Status: DC
  Administered 2022-08-17 (×3): 3 mL via RESPIRATORY_TRACT
  Filled 2022-08-17 (×3): qty 3

## 2022-08-17 MED ORDER — ALBUTEROL SULFATE (2.5 MG/3ML) 0.083% IN NEBU
2.5000 mg | INHALATION_SOLUTION | RESPIRATORY_TRACT | Status: DC
  Administered 2022-08-17 – 2022-08-20 (×12): 2.5 mg via RESPIRATORY_TRACT
  Filled 2022-08-17 (×15): qty 3

## 2022-08-17 MED ORDER — POTASSIUM CHLORIDE 20 MEQ PO PACK
40.0000 meq | PACK | Freq: Once | ORAL | Status: AC
  Administered 2022-08-17: 40 meq via ORAL
  Filled 2022-08-17: qty 2

## 2022-08-17 MED ORDER — HYDROCOD POLI-CHLORPHE POLI ER 10-8 MG/5ML PO SUER
5.0000 mL | Freq: Two times a day (BID) | ORAL | Status: DC | PRN
  Administered 2022-08-19 – 2022-08-23 (×2): 5 mL via ORAL
  Filled 2022-08-17 (×2): qty 5

## 2022-08-17 NOTE — Progress Notes (Signed)
HD#0 Subjective:   Summary: Patient is a 71 yo F with past medical history of COPD, hypertension, type 2 diabetes, hyperlipidemia, and breast cancer who presents with shortness of breath and cough.   Overnight Events: NAEO. RVP significant for Rhinovirus/enterovirus.  Patient feeling better today. Feels like wheezing is improved. Continues with coughing spells. These are distressing for her. She is especially concerned when she is unable to locate her call bell. However, she does feel like she is better able to cope with episodes. We discussed adding cough suppressant as needed during the day.   Objective:  Vital signs in last 24 hours: Vitals:   08/17/22 0428 08/17/22 0732 08/17/22 0850 08/17/22 1114  BP: (!) 132/116 (!) 160/78    Pulse: 65 100    Resp: 17 18    Temp: 97.6 F (36.4 C) 98.9 F (37.2 C)    TempSrc:  Oral    SpO2: 98% 95% 96% 96%  Weight:       Supplemental O2: Room Air SpO2: 96 %   Physical Exam:  Constitutional: mildly ill-appearing female sitting in hospital bed, in no acute distress HENT: normocephalic atraumatic, mucous membranes moist Eyes: conjunctiva non-erythematous Neck: supple Pulmonary/Chest: Diffuse expiratory wheezing MSK: normal bulk and tone Neurological: alert & oriented x 3 Skin: warm and dry Psych: Normal mood and affect  Filed Weights   08/14/22 1929  Weight: 74.8 kg     Intake/Output Summary (Last 24 hours) at 08/17/2022 1336 Last data filed at 08/17/2022 0431 Gross per 24 hour  Intake 240 ml  Output 2000 ml  Net -1760 ml   Net IO Since Admission: -4,640 mL [08/17/22 1336]  Pertinent Labs:    Latest Ref Rng & Units 08/17/2022    2:48 AM 08/16/2022   12:10 AM 08/15/2022    7:00 AM  CBC  WBC 4.0 - 10.5 K/uL 9.0  6.1  3.0   Hemoglobin 12.0 - 15.0 g/dL 13.4  12.4  13.0   Hematocrit 36.0 - 46.0 % 38.0  36.5  37.2   Platelets 150 - 400 K/uL 309  263  295        Latest Ref Rng & Units 08/17/2022    2:48 AM 08/16/2022    12:10 AM 08/15/2022    7:00 AM  CMP  Glucose 70 - 99 mg/dL 103  97  125   BUN 8 - 23 mg/dL '9  8  5   '$ Creatinine 0.44 - 1.00 mg/dL 0.71  0.68  0.76   Sodium 135 - 145 mmol/L 136  138  139   Potassium 3.5 - 5.1 mmol/L 3.4  3.7  3.6   Chloride 98 - 111 mmol/L 98  105  102   CO2 22 - 32 mmol/L '29  24  24   '$ Calcium 8.9 - 10.3 mg/dL 8.8  8.8  9.8   Total Protein 6.5 - 8.1 g/dL   6.7   Total Bilirubin 0.3 - 1.2 mg/dL   0.4   Alkaline Phos 38 - 126 U/L   95   AST 15 - 41 U/L   21   ALT 0 - 44 U/L   12     Imaging: No results found.  Assessment/Plan:   Active Problems:   COPD with acute exacerbation [facility])   Patient Summary: Mrs. Rightmyer is a 71 yo F with past medical history of COPD, hypertension, type 2 diabetes, hyperlipidemia, and breast cancer who presents with shortness of breath and cough admitted for COPD  exacerbation.   #COPD with acute exacerbation, Gold Group E #Viral respiratory syndrome RVP positive for rhinovirus/enterovirus, this likely precipitated her exacerbation. Afebrile with normal WBC now. Normal RR and sating well on RA. Duonebs now scheduled. Coughing spells with SOB still present, though patient states she is better able to calm herself down. Lung exam improved though still with wheezing diffusely. Will add additional Tussionex dose to help with daytime coughing spells as needed. -Duoneb q4 hr scheduled -Anoro Ellipta once daily -prednisone 40 mg p.o. daily, last dose 10/11 -Tussionex q12 prn -Continue Mucinex BID prn, tessalon capsule TID -Encourage smoking cessation  #Hypertension She was prescribed amlodipine, metoprolol, losartan, HCTZ outpatient. Continues to be hypertensive today. Goal <130. Will consider increasing HCTZ if continues to be elevated. -Continue HCTZ 25 mg daily -Continue amlodipine 10 mg daily -Continue losartan 100 mg daily  #Anxiety #Sleep Difficulty -Continue Cymbalta 60 mg daily -Continue ramelteon 8 mg  nightly  #HLD Simvastatin 40 mg daily   Diet: Normal VTE: Enoxaparin IVF: None,None Code: Full   Prior to Admission Living Arrangement: Home, living with husband Anticipated Discharge Location: Home Barriers to Discharge: Medical management   Dispo: Admit patient to Observation with expected length of stay less than 2 midnights.  Linward Natal MD Internal Medicine Resident PGY-1 Please contact the on call pager after 5 pm and on weekends at [phone].

## 2022-08-18 ENCOUNTER — Telehealth: Payer: Self-pay

## 2022-08-18 LAB — BASIC METABOLIC PANEL
Anion gap: 10 (ref 5–15)
BUN: 9 mg/dL (ref 8–23)
CO2: 28 mmol/L (ref 22–32)
Calcium: 8.8 mg/dL — ABNORMAL LOW (ref 8.9–10.3)
Chloride: 97 mmol/L — ABNORMAL LOW (ref 98–111)
Creatinine, Ser: 0.72 mg/dL (ref 0.44–1.00)
GFR, Estimated: >60 mL/min (ref >60)
Glucose, Bld: 114 mg/dL — ABNORMAL HIGH (ref 70–99)
Potassium: 3.3 mmol/L — ABNORMAL LOW (ref 3.5–5.1)
Sodium: 135 mmol/L (ref 135–145)

## 2022-08-18 LAB — CBC
HCT: 36 % (ref 36.0–46.0)
Hemoglobin: 12.6 g/dL (ref 12.0–15.0)
MCH: 31.8 pg (ref 26.0–34.0)
MCHC: 35 g/dL (ref 30.0–36.0)
MCV: 90.9 fL (ref 80.0–100.0)
Platelets: 300 10*3/uL (ref 150–400)
RBC: 3.96 MIL/uL (ref 3.87–5.11)
RDW: 12.7 % (ref 11.5–15.5)
WBC: 11.1 10*3/uL — ABNORMAL HIGH (ref 4.0–10.5)
nRBC: 0 % (ref 0.0–0.2)

## 2022-08-18 MED ORDER — POTASSIUM CHLORIDE 20 MEQ PO PACK
40.0000 meq | PACK | Freq: Two times a day (BID) | ORAL | Status: AC
  Administered 2022-08-18 (×2): 40 meq via ORAL
  Filled 2022-08-18 (×2): qty 2

## 2022-08-18 MED ORDER — METHYLPREDNISOLONE SODIUM SUCC 125 MG IJ SOLR
125.0000 mg | Freq: Every day | INTRAMUSCULAR | Status: DC
  Administered 2022-08-18 – 2022-08-23 (×6): 125 mg via INTRAVENOUS
  Filled 2022-08-18 (×6): qty 2

## 2022-08-18 NOTE — Telephone Encounter (Signed)
Decision:Denied  [name] Key: [id number] - PA Case ID: [id number] - Rx #: [id number] Need help? Call us at [phone] Outcome Deniedtoday Questionnaire submitted. PA Case [id number] Status: Denied. Notifications Pending. Drug Ipratropium-Albuterol 0.5-2.5 (3)MG/3ML solution Form [employer] 4-Part Electronic PA Form (2017 NCPDP) Original Claim Info [phone] Prior Authorization Required ANDA;T2;PA REQUIRED FOR PART B VS D DETERMINATION.;Prior Authorization Required;;CALL [phone];OR LOG ON TO [email or web address] TO INITIATE EXCEPTION REQUEST;PROVIDE NOTICE-MEDICARE PRESC

## 2022-08-18 NOTE — Telephone Encounter (Signed)
Prior Authorization for patient (Ipratropium-Albuterol) came through on cover my meds was submitted with last office notes awaiting approval or denial.

## 2022-08-18 NOTE — Progress Notes (Signed)
Mobility Specialist Progress Note:   08/18/22 1452  Mobility  Activity Ambulated with assistance in hallway  Level of Assistance Standby assist, set-up cues, supervision of patient - no hands on  Assistive Device Other (Comment) (Hall Rails)  Distance Ambulated (ft) 150 ft  Activity Response Tolerated poorly;RN notified  Mobility Referral Yes  $Mobility charge 1 Mobility   During mobility: 93% SpO2 Post-mobility: 93% SpO2  Pt received in bed and agreeable. Pt had slight wheeze perambulation which worsened with ambulation, SpO2 stable throughout session. Pt c/o fatigue and worsened wheeze. Pt left sitting EOB with all needs met and call bell in reach. RN notified.    Keiona Jenison Mobility Specialist-Acute Rehab Secure Chat only

## 2022-08-18 NOTE — Progress Notes (Signed)
HD#0 Subjective:   Summary: Patient is a 71 yo F with past medical history of COPD, hypertension, type 2 diabetes, hyperlipidemia, and breast cancer who presents with shortness of breath and cough.   Overnight Events: NAEO.  Patient states she has not felt well for over a month but symptoms worsened after developing a cold 2 weeks ago.  She states that her coughing spells are frightening as she is unable to communicate when she is having them due to shortness of breath.  She feels like her wheezing has not improved.  She also endorses allergy symptoms currently.  Objective:  Vital signs in last 24 hours: Vitals:   08/18/22 0747 08/18/22 0845 08/18/22 0846 08/18/22 1335  BP: (!) 151/68     Pulse: 99 99  91  Resp: '20 18  18  '$ Temp: 98.9 F (37.2 C)     TempSrc: Oral     SpO2: 99% 98% 98% 97%  Weight:       Supplemental O2: Room Air SpO2: 97 %   Physical Exam:  Constitutional: ill-appearing female sitting in hospital bed HENT: normocephalic atraumatic, mucous membranes moist Eyes: conjunctiva non-erythematous Neck: supple, wheezing with inspiration and expiration slightly attenuated when lips pursed Cardiovascular: Regular rate and rhythm, no M/R/G Pulmonary/Chest: Diffuse inspiratory and expiratory wheezing but predominantly expiratory, prolonged expiratory phase, decreased breath sounds at lung bases MSK: normal bulk and tone Neurological: alert & oriented x 3 Skin: warm and dry Psych: Noticeably sad, tearful  Filed Weights   08/14/22 1929  Weight: 74.8 kg     Intake/Output Summary (Last 24 hours) at 08/18/2022 1359 Last data filed at 08/18/2022 0900 Gross per 24 hour  Intake 240 ml  Output 400 ml  Net -160 ml   Net IO Since Admission: -4,800 mL [08/18/22 1359]  Pertinent Labs:    Latest Ref Rng & Units 08/18/2022    3:12 AM 08/17/2022    2:48 AM 08/16/2022   12:10 AM  CBC  WBC 4.0 - 10.5 K/uL 11.1  9.0  6.1   Hemoglobin 12.0 - 15.0 g/dL 12.6  13.4   12.4   Hematocrit 36.0 - 46.0 % 36.0  38.0  36.5   Platelets 150 - 400 K/uL 300  309  263        Latest Ref Rng & Units 08/18/2022    3:12 AM 08/17/2022    2:48 AM 08/16/2022   12:10 AM  CMP  Glucose 70 - 99 mg/dL 114  103  97   BUN 8 - 23 mg/dL '9  9  8   '$ Creatinine 0.44 - 1.00 mg/dL 0.72  0.71  0.68   Sodium 135 - 145 mmol/L 135  136  138   Potassium 3.5 - 5.1 mmol/L 3.3  3.4  3.7   Chloride 98 - 111 mmol/L 97  98  105   CO2 22 - 32 mmol/L '28  29  24   '$ Calcium 8.9 - 10.3 mg/dL 8.8  8.8  8.8     Imaging: No results found.  Assessment/Plan:   Active Problems:   COPD with acute exacerbation [facility])   Patient Summary: Mrs. Mosteller is a 71 yo F with past medical history of COPD, hypertension, type 2 diabetes, hyperlipidemia, and breast cancer who presents with shortness of breath and cough admitted for COPD exacerbation on hospital day 0.   #COPD with acute exacerbation, Gold Group E #Viral respiratory syndrome Patient continues with prolonged expiratory phase, diffuse inspiratory and expiratory wheezing, decreased  breath sounds at lung bases, severe coughing paroxysms that are very distressing for her. She is now on Tussionex every 12 hours prn and Mucinex. Continues with viral respiratory symptoms. Mild leukocytosis likely 2/2 steroids. Given symptoms refractory to current regimen including scheduled Duonebs and daily Anoro Ellipta, will replace oral prednisone with Solumedrol 125 mg IV daily and monitor for improvement. -Duoneb q4 hr scheduled -Anoro Ellipta once daily -Solumedrol 125 mg IV daily -Tussionex q12 hours prn, Mucinex DM twice daily -Encourage smoking cessation -Continue Mucinex BID prn   #Hypertension She was prescribed amlodipine, metoprolol, losartan, HCTZ outpatient. Systolics remain in 834H. Goal <130.  -Continue HCTZ 25 mg daily -Continue amlodipine 10 mg daily -Continue home losartan 100 mg daily  #Anxiety #Sleep Difficulty -Continue Cymbalta 60 mg  daily -Continue ramelteon 8 mg nightly   Diet: Normal VTE: Enoxaparin IVF: None,None Code: Full   Prior to Admission Living Arrangement: Home, living with husband Anticipated Discharge Location: Home Barriers to Discharge: Medical management   Dispo: Admit patient to Observation with expected length of stay less than 2 midnights.  Linward Natal MD Internal Medicine Resident PGY-1 Please contact the on call pager after 5 pm and on weekends at [phone].

## 2022-08-19 DIAGNOSIS — J385 Laryngeal spasm: Secondary | ICD-10-CM | POA: Diagnosis present

## 2022-08-19 DIAGNOSIS — G479 Sleep disorder, unspecified: Secondary | ICD-10-CM | POA: Diagnosis present

## 2022-08-19 DIAGNOSIS — Z888 Allergy status to other drugs, medicaments and biological substances status: Secondary | ICD-10-CM | POA: Diagnosis not present

## 2022-08-19 DIAGNOSIS — J441 Chronic obstructive pulmonary disease with (acute) exacerbation: Secondary | ICD-10-CM | POA: Diagnosis present

## 2022-08-19 DIAGNOSIS — Z853 Personal history of malignant neoplasm of breast: Secondary | ICD-10-CM | POA: Diagnosis not present

## 2022-08-19 DIAGNOSIS — F32A Depression, unspecified: Secondary | ICD-10-CM | POA: Diagnosis present

## 2022-08-19 DIAGNOSIS — Z1152 Encounter for screening for COVID-19: Secondary | ICD-10-CM | POA: Diagnosis not present

## 2022-08-19 DIAGNOSIS — E871 Hypo-osmolality and hyponatremia: Secondary | ICD-10-CM | POA: Diagnosis present

## 2022-08-19 DIAGNOSIS — I10 Essential (primary) hypertension: Secondary | ICD-10-CM | POA: Diagnosis present

## 2022-08-19 DIAGNOSIS — T380X5A Adverse effect of glucocorticoids and synthetic analogues, initial encounter: Secondary | ICD-10-CM | POA: Diagnosis present

## 2022-08-19 DIAGNOSIS — Z9221 Personal history of antineoplastic chemotherapy: Secondary | ICD-10-CM | POA: Diagnosis not present

## 2022-08-19 DIAGNOSIS — Z7951 Long term (current) use of inhaled steroids: Secondary | ICD-10-CM | POA: Diagnosis not present

## 2022-08-19 DIAGNOSIS — Z79899 Other long term (current) drug therapy: Secondary | ICD-10-CM | POA: Diagnosis not present

## 2022-08-19 DIAGNOSIS — B9789 Other viral agents as the cause of diseases classified elsewhere: Secondary | ICD-10-CM | POA: Diagnosis present

## 2022-08-19 DIAGNOSIS — F17210 Nicotine dependence, cigarettes, uncomplicated: Secondary | ICD-10-CM | POA: Diagnosis present

## 2022-08-19 DIAGNOSIS — Z599 Problem related to housing and economic circumstances, unspecified: Secondary | ICD-10-CM | POA: Diagnosis not present

## 2022-08-19 DIAGNOSIS — B9710 Unspecified enterovirus as the cause of diseases classified elsewhere: Secondary | ICD-10-CM | POA: Diagnosis present

## 2022-08-19 DIAGNOSIS — E1165 Type 2 diabetes mellitus with hyperglycemia: Secondary | ICD-10-CM | POA: Diagnosis present

## 2022-08-19 DIAGNOSIS — Z923 Personal history of irradiation: Secondary | ICD-10-CM | POA: Diagnosis not present

## 2022-08-19 DIAGNOSIS — F419 Anxiety disorder, unspecified: Secondary | ICD-10-CM | POA: Diagnosis present

## 2022-08-19 DIAGNOSIS — K219 Gastro-esophageal reflux disease without esophagitis: Secondary | ICD-10-CM | POA: Diagnosis present

## 2022-08-19 DIAGNOSIS — E785 Hyperlipidemia, unspecified: Secondary | ICD-10-CM | POA: Diagnosis present

## 2022-08-19 DIAGNOSIS — Z803 Family history of malignant neoplasm of breast: Secondary | ICD-10-CM | POA: Diagnosis not present

## 2022-08-19 DIAGNOSIS — B974 Respiratory syncytial virus as the cause of diseases classified elsewhere: Secondary | ICD-10-CM

## 2022-08-19 DIAGNOSIS — J440 Chronic obstructive pulmonary disease with acute lower respiratory infection: Secondary | ICD-10-CM | POA: Diagnosis present

## 2022-08-19 LAB — CBC
HCT: 39.1 % (ref 36.0–46.0)
Hemoglobin: 13.3 g/dL (ref 12.0–15.0)
MCH: 31.4 pg (ref 26.0–34.0)
MCHC: 34 g/dL (ref 30.0–36.0)
MCV: 92.2 fL (ref 80.0–100.0)
Platelets: 323 10*3/uL (ref 150–400)
RBC: 4.24 MIL/uL (ref 3.87–5.11)
RDW: 12.8 % (ref 11.5–15.5)
WBC: 12.9 10*3/uL — ABNORMAL HIGH (ref 4.0–10.5)
nRBC: 0 % (ref 0.0–0.2)

## 2022-08-19 LAB — BASIC METABOLIC PANEL
Anion gap: 9 (ref 5–15)
BUN: 10 mg/dL (ref 8–23)
CO2: 27 mmol/L (ref 22–32)
Calcium: 9.2 mg/dL (ref 8.9–10.3)
Chloride: 95 mmol/L — ABNORMAL LOW (ref 98–111)
Creatinine, Ser: 0.75 mg/dL (ref 0.44–1.00)
GFR, Estimated: >60 mL/min (ref >60)
Glucose, Bld: 170 mg/dL — ABNORMAL HIGH (ref 70–99)
Potassium: 4.9 mmol/L (ref 3.5–5.1)
Sodium: 131 mmol/L — ABNORMAL LOW (ref 135–145)

## 2022-08-19 MED ORDER — DM-GUAIFENESIN ER 30-600 MG PO TB12
1.0000 | ORAL_TABLET | Freq: Two times a day (BID) | ORAL | Status: AC
  Administered 2022-08-19 – 2022-08-22 (×8): 1 via ORAL
  Filled 2022-08-19 (×8): qty 1

## 2022-08-19 MED ORDER — LORAZEPAM 2 MG/ML IJ SOLN
0.5000 mg | Freq: Once | INTRAMUSCULAR | Status: AC
  Administered 2022-08-19: 0.5 mg via INTRAVENOUS
  Filled 2022-08-19: qty 1

## 2022-08-19 NOTE — Progress Notes (Addendum)
HD#5 Subjective:   Summary: Patient is a 71 yo F with past medical history of COPD which is poorly controlled due to inability to afford medications, hypertension, type 2 diabetes, hyperlipidemia, and breast cancer in remission who presents with COPD exacerbation manifested as dyspnea and cough  Overnight Events: NAEO. PA for outpatient prescription of ipratropium-albuterol denied per chart review.  Patient continues to endorse severe coughing spells triggered by exertion and by deep breath. She walked to nurses station for the first time today but was very distressed with wheezing upon returning to bed. She misses her family but is concerned about returning home as she feels she would be right back in the ED.   Objective:  Vital signs in last 24 hours: Vitals:   08/19/22 0452 08/19/22 0818 08/19/22 1111 08/19/22 1337  BP:  (!) 165/72    Pulse:  92    Resp:  18    Temp:  98.7 F (37.1 C)    TempSrc:  Oral    SpO2: 95% 97% 96% 95%  Weight:       Supplemental O2: Room Air SpO2: 95 %   Physical Exam:  Constitutional: ill-appearing female sitting in hospital bed Neck: supple Cardiovascular: Regular rate and rhythm, no M/R/G Pulmonary/Chest: Wheezing in mid-upper airway, prolonged expiratory phase, patient has difficulty conversing due to SOB MSK: normal bulk and tone Neurological: alert & oriented x 3 Skin: warm and dry Psych: Depressed but not tearful today  Filed Weights   08/14/22 1929  Weight: 74.8 kg     Intake/Output Summary (Last 24 hours) at 08/19/2022 1506 Last data filed at 08/19/2022 1300 Gross per 24 hour  Intake 360 ml  Output 300 ml  Net 60 ml   Net IO Since Admission: -4,740 mL [08/19/22 1506]  Pertinent Labs:    Latest Ref Rng & Units 08/19/2022    3:58 AM 08/18/2022    3:12 AM 08/17/2022    2:48 AM  CBC  WBC 4.0 - 10.5 K/uL 12.9  11.1  9.0   Hemoglobin 12.0 - 15.0 g/dL 13.3  12.6  13.4   Hematocrit 36.0 - 46.0 % 39.1  36.0  38.0    Platelets 150 - 400 K/uL 323  300  309        Latest Ref Rng & Units 08/19/2022    3:58 AM 08/18/2022    3:12 AM 08/17/2022    2:48 AM  CMP  Glucose 70 - 99 mg/dL 170  114  103   BUN 8 - 23 mg/dL '10  9  9   '$ Creatinine 0.44 - 1.00 mg/dL 0.75  0.72  0.71   Sodium 135 - 145 mmol/L 131  135  136   Potassium 3.5 - 5.1 mmol/L 4.9  3.3  3.4   Chloride 98 - 111 mmol/L 95  97  98   CO2 22 - 32 mmol/L '27  28  29   '$ Calcium 8.9 - 10.3 mg/dL 9.2  8.8  8.8     Imaging: No results found.  Assessment/Plan:   Active Problems:   COPD with acute exacerbation [facility])   Patient Summary: Mrs. Munoz is a 71 yo F with past medical history of COPD, hypertension, type 2 diabetes, hyperlipidemia, and breast cancer who presents with shortness of breath and cough admitted for COPD exacerbation.   #COPD with acute exacerbation, Gold Group E #Viral respiratory syndrome Patient continues with diffuse wheezing and severe coughing paroxysms that are very distressing for her. She is  now on Tussionex every 12 hours prn and Mucinex. Mild leukocytosis   2/2 steroids. Will continue with Solumedrol given minial if any improvement with symptoms. Will trial a dose of lorazepam to address any laryngospasm.   -Duoneb q4 hr scheduled -Anoro Ellipta once daily -Albuterol prn -Ativan 0.5 mg IV once and assess for benefit -Solumedrol 125 mg IV daily -Tussionex q12 hours prn, Mucinex DM twice daily, Tessalon -Encourage smoking cessation   #Hypertension She was prescribed amlodipine, metoprolol, losartan, HCTZ outpatient. Some pressures within range. Occasionally hypertensive but likely a result of dyspnea.  -Continue HCTZ 25 mg daily -Continue amlodipine 10 mg daily -Continue home losartan 100 mg daily  #Hyponatremia Na 131 (135). Likely due to losartan and HCTZ. -Continue to monitor -daily BMP  #Anxiety #Sleep Difficulty -Continue Cymbalta 60 mg daily -Continue ramelteon 8 mg nightly   Diet: Normal VTE:  Enoxaparin IVF: None,None Code: Full   Prior to Admission Living Arrangement: Home, living with husband Anticipated Discharge Location: Home Barriers to Discharge: Medical management   Dispo: Admit patient to Observation with expected length of stay less than 2 midnights.  Linward Natal MD Internal Medicine Resident PGY-1 Please contact the on call pager after 5 pm and on weekends at [phone].

## 2022-08-20 DIAGNOSIS — B974 Respiratory syncytial virus as the cause of diseases classified elsewhere: Secondary | ICD-10-CM | POA: Diagnosis not present

## 2022-08-20 DIAGNOSIS — J441 Chronic obstructive pulmonary disease with (acute) exacerbation: Secondary | ICD-10-CM | POA: Diagnosis not present

## 2022-08-20 DIAGNOSIS — F17210 Nicotine dependence, cigarettes, uncomplicated: Secondary | ICD-10-CM | POA: Diagnosis not present

## 2022-08-20 LAB — BASIC METABOLIC PANEL
Anion gap: 11 (ref 5–15)
BUN: 11 mg/dL (ref 8–23)
CO2: 25 mmol/L (ref 22–32)
Calcium: 8.8 mg/dL — ABNORMAL LOW (ref 8.9–10.3)
Chloride: 97 mmol/L — ABNORMAL LOW (ref 98–111)
Creatinine, Ser: 0.76 mg/dL (ref 0.44–1.00)
GFR, Estimated: >60 mL/min (ref >60)
Glucose, Bld: 184 mg/dL — ABNORMAL HIGH (ref 70–99)
Potassium: 4.1 mmol/L (ref 3.5–5.1)
Sodium: 133 mmol/L — ABNORMAL LOW (ref 135–145)

## 2022-08-20 LAB — CBC
HCT: 38.6 % (ref 36.0–46.0)
Hemoglobin: 13.6 g/dL (ref 12.0–15.0)
MCH: 32.1 pg (ref 26.0–34.0)
MCHC: 35.2 g/dL (ref 30.0–36.0)
MCV: 91 fL (ref 80.0–100.0)
Platelets: 326 10*3/uL (ref 150–400)
RBC: 4.24 MIL/uL (ref 3.87–5.11)
RDW: 12.6 % (ref 11.5–15.5)
WBC: 15.7 10*3/uL — ABNORMAL HIGH (ref 4.0–10.5)
nRBC: 0 % (ref 0.0–0.2)

## 2022-08-20 MED ORDER — IPRATROPIUM-ALBUTEROL 0.5-2.5 (3) MG/3ML IN SOLN
3.0000 mL | RESPIRATORY_TRACT | Status: DC
  Administered 2022-08-20 – 2022-08-21 (×7): 3 mL via RESPIRATORY_TRACT
  Filled 2022-08-20 (×7): qty 3

## 2022-08-20 MED ORDER — BUDESONIDE 0.25 MG/2ML IN SUSP
0.2500 mg | Freq: Two times a day (BID) | RESPIRATORY_TRACT | Status: AC
  Administered 2022-08-20 (×2): 0.25 mg via RESPIRATORY_TRACT
  Filled 2022-08-20 (×2): qty 2

## 2022-08-20 NOTE — Progress Notes (Signed)
Mobility Specialist Progress Note:   08/20/22 1005  Mobility  Activity Ambulated with assistance in hallway  Level of Assistance Standby assist, set-up cues, supervision of patient - no hands on  Assistive Device Other (Comment) (Hall Rails)  Distance Ambulated (ft) 300 ft  Activity Response Tolerated well  Mobility Referral Yes  $Mobility charge 1 Mobility   Pt received in bed and agreeable. Minimal wheezing before, during, and after ambulation. No complaints of SOB or worsened wheezing with ambulation. Pt left sitting EOB with all needs met and call bell in reach.   Jp Eastham Mobility Specialist-Acute Rehab Secure Chat only

## 2022-08-20 NOTE — TOC Initial Note (Signed)
Transition of Care [facility]) - Initial/Assessment Note    Patient Details  Name: [name] MRN: [id number] Date of Birth: 29-Aug-1951  Transition of Care [facility]) CM/SW Contact:    Christie Labrum, RN Phone Number: 08/20/2022, 11:07 AM  Clinical Narrative:                 CM met with the patient at the bedside to discuss transitions of care needs.  The patient lives with her husband at the home .  The patient is an active smoker at this time but states that she is working on quitting.  The patient remains inpatient at this time for medication management for SOB.  Smoking cessation will be included in the discharge instructions.  PCP - The patient plans to follow up with Dr. Carin [name] after discharge home from the hospital.  The patient states that she has difficulty affording her medications.  Attending physician is aware and I asked that [facility] assist with medications for home once she is medically stable for discharge.  The patient remains on room air.  Transportation - the patient has family to provide transportation to home once she is medically stable to discharge.  Expected Discharge Plan: Home/Self Care Barriers to Discharge: Continued Medical Work up   Patient Goals and CMS Choice Patient states their goals for this hospitalization and ongoing recovery are:: to return home CMS Medicare.gov Compare Post Acute Care list provided to:: Patient Choice offered to / list presented to : Patient  Expected Discharge Plan and Services Expected Discharge Plan: Home/Self Care   Discharge Planning Services: CM Consult, Medication Assistance   Living arrangements for the past 2 months: Single Family Home                                      Prior Living Arrangements/Services Living arrangements for the past 2 months: Single Family Home Lives with:: Spouse Patient language and need for interpreter reviewed:: Yes Do you feel safe going back to the place where  you live?: Yes      Need for Family Participation in Patient Care: Yes (Comment) Care giver support system in place?: Yes (comment) Current home services: Other (comment) [facility]) Criminal Activity/Legal Involvement Pertinent to Current Situation/Hospitalization: No - Comment as needed  Activities of Daily Living Home Assistive Devices/Equipment: None ADL Screening (condition at time of admission) Patient's cognitive ability adequate to safely complete daily activities?: Yes Is the patient deaf or have difficulty hearing?: No Does the patient have difficulty seeing, even when wearing glasses/contacts?: No Does the patient have difficulty concentrating, remembering, or making decisions?: No Patient able to express need for assistance with ADLs?: Yes Does the patient have difficulty dressing or bathing?: No Independently performs ADLs?: Yes (appropriate for developmental age) Does the patient have difficulty walking or climbing stairs?: No Weakness of Legs: None Weakness of Arms/Hands: None  Permission Sought/Granted Permission sought to share information with : Case Manager, Family Supports, PCP Permission granted to share information with : Yes, Verbal Permission Granted        Permission granted to share info w Relationship: spouse [name] - [phone]     Emotional Assessment   Attitude/Demeanor/Rapport: Engaged Affect (typically observed): Accepting Orientation: : Oriented to Self, Oriented to Place, Oriented to  Time, Oriented to Situation Alcohol / Substance Use: Tobacco Use Psych Involvement: No (comment)  Admission diagnosis:  COPD exacerbation ([place]) [email or web address].1]  COPD with acute exacerbation ([place]) [J44.1] Patient Active Problem List   Diagnosis Date Noted   COPD exacerbation ([place]) 08/19/2022   COPD with acute exacerbation ([place]) 08/14/2022   Age-related osteoporosis without current pathological fracture  03/06/2021   Screening for colon cancer 03/06/2021   Breast  nodule 04/22/2020   Chronic hip pain, right 06/22/2019   Diabetic neuropathy ([place]) 05/21/2019   Anxiety 01/09/2019   Type 2 diabetes mellitus with complication, without long-term current use of insulin ([place]) 12/26/2018   Chronic sinusitis    Chronic obstructive pulmonary disease with bronchospasm ([place]) 12/24/2014   Dyslipidemia associated with type 2 diabetes mellitus ([place]) 06/14/2014   Personal history of breast cancer 12/01/2012   Primary osteoarthritis of right knee 09/29/2012   GAD (generalized anxiety disorder) 12/30/2009   Tobacco abuse 04/06/2009   Hypertension, benign essential, goal below 140/90 04/06/2009   PCP:  Christie Gasser, MD Pharmacy:   [facility] [place], [place] [name] [place]  [address] Phone: [phone] Fax: [phone]  [id number] - [place], [address] AT [facility] [address] [place] [address] Phone: [phone] Fax: [phone]     Social Determinants of Health (SDOH) Interventions    Readmission Risk Interventions    08/20/2022   11:07 AM  Readmission Risk Prevention Plan  Post Dischage Appt Complete  Medication Screening Complete  Transportation Screening Complete

## 2022-08-20 NOTE — Progress Notes (Cosign Needed Addendum)
HD#5 Subjective:   Summary: Patient is a 71 yo F with past medical history of COPD which is poorly controlled due to inability to afford medications, hypertension, type 2 diabetes, hyperlipidemia, and breast cancer in remission who presents with COPD exacerbation manifested as dyspnea and cough  Overnight Events: NAEO.   Patient states she feels a little bit better today but reports severe shortness of breath on exertion (when walking to bathroom). Continues with coughing spells that lead to shortness of breath. She feels like she often has episodes of SOB before her inhaler/nebs are due. She had a dose of ativan yesterday evening but is unable to remember if this helped with her upper airway tightness / wheezing. She does recall it making her very tired. Endorses good hydration and nutrition.  Objective:  Vital signs in last 24 hours: Vitals:   08/20/22 0827 08/20/22 1025 08/20/22 1032 08/20/22 1255  BP:      Pulse: 98     Resp: 20     Temp:      TempSrc:      SpO2: 96% 96% 97% 95%  Weight:       Supplemental O2: Room Air SpO2: 95 %   Physical Exam:  Constitutional: ill-appearing female sitting in hospital bed Neck: supple Cardiovascular: Regular rate and rhythm, no M/R/G Pulmonary/Chest: Wheezing in mid-upper airway as well as distal airways, breath sounds improved at bases today, prolonged expiratory phase, patient has multiple coughing spells during rounds. MSK: normal bulk and tone Neurological: alert & oriented x 3 Skin: warm and dry Psych: Depressed but not tearful today  Filed Weights   08/14/22 1929  Weight: 74.8 kg     Intake/Output Summary (Last 24 hours) at 08/20/2022 1414 Last data filed at 08/19/2022 1708 Gross per 24 hour  Intake 240 ml  Output --  Net 240 ml   Net IO Since Admission: -4,500 mL [08/20/22 1414]  Pertinent Labs:    Latest Ref Rng & Units 08/20/2022    4:02 AM 08/19/2022    3:58 AM 08/18/2022    3:12 AM  CBC  WBC 4.0 - 10.5  K/uL 15.7  12.9  11.1   Hemoglobin 12.0 - 15.0 g/dL 13.6  13.3  12.6   Hematocrit 36.0 - 46.0 % 38.6  39.1  36.0   Platelets 150 - 400 K/uL 326  323  300        Latest Ref Rng & Units 08/20/2022    4:02 AM 08/19/2022    3:58 AM 08/18/2022    3:12 AM  CMP  Glucose 70 - 99 mg/dL 184  170  114   BUN 8 - 23 mg/dL '11  10  9   '$ Creatinine 0.44 - 1.00 mg/dL 0.76  0.75  0.72   Sodium 135 - 145 mmol/L 133  131  135   Potassium 3.5 - 5.1 mmol/L 4.1  4.9  3.3   Chloride 98 - 111 mmol/L 97  95  97   CO2 22 - 32 mmol/L '25  27  28   '$ Calcium 8.9 - 10.3 mg/dL 8.8  9.2  8.8     Imaging: No results found.  Assessment/Plan:   Active Problems:   COPD with acute exacerbation [name])   Patient Summary: [name] is a 71 yo F with past medical history of COPD, hypertension, type 2 diabetes, hyperlipidemia, and breast cancer who presents with shortness of breath and cough admitted for COPD exacerbation.   #COPD with acute exacerbation, Gold  Group E #Viral respiratory syndrome Patient's lung exam still with diffuse wheezing, though encouraged by improved air movement at bases. Will continue with Solumedrol given minimal if any improvement with symptoms. Adding pulmicort today as well as scheduled Duonebs every 4 hours. Continue on Mucinex, Tussionex q12 hours for cough paroxysms.  -Pulmicort twice daily -Duoneb q4 hr scheduled -Anoro Ellipta once daily -Albuterol prn -Ativan 0.5 mg IV once and assess for benefit -Solumedrol 125 mg IV daily, day 5 -Tussionex q12 hours prn, Mucinex DM twice daily, Tessalon capsules -Encourage smoking cessation   #Hypertension She was prescribed amlodipine, metoprolol, losartan, HCTZ outpatient. Occasionally hypertensive but likely a result of dyspnea/coughing spells. Normotensive this morning. -Continue HCTZ 25 mg daily -Continue amlodipine 10 mg daily -Continue home losartan 100 mg daily  #Hyponatremia Na 133 today. Likely due to losartan and  HCTZ. -Continue to monitor -BMP every other day for now  #Anxiety #Sleep Difficulty -Continue Cymbalta 60 mg daily -Continue ramelteon 8 mg nightly  Hx of Breast Cancer Pt with h/o breast CA s/p lumpectomy with chemo/radiation in 1991. Last in June 2021 per chart review. Recommended annual scans. -referral for outpatient mammogram   Diet: Normal VTE: Enoxaparin IVF: None,None Code: Full   Prior to Admission Living Arrangement: Home, living with husband Anticipated Discharge Location: Home Barriers to Discharge: [place] MD Internal Medicine Resident PGY-1 Please contact the on call pager after 5 pm and on weekends at [phone].

## 2022-08-20 NOTE — Consult Note (Signed)
[facility] CM Inpatient Consult   08/20/2022  [name] November 01, 1951 [id number]  [facility] [[facility]]  Accountable Care Organization [ACO] Patient: Medicare ACO REACH  Referral:  Inpatient [facility] RNCM  Primary Care Provider: [name], MD, with [facility] Internal Medicine.  Patient evaluated for community based chronic complex disease management services with [facility] Care Management Program as a benefit of patient's [employer]. Spoke with inpatient [facility] RNCM that patient had been referred to [facility] Care Management team however, noted difficulty gaining or maintaining contact. 11:34 Call attempts on unit rounds, to preserve PPE, [Rhino virus detected] to assess for post hospital follow up support for care coordination needs.   Plan: Patient will continue to follow for post hospital needs.  Patient has a [facility] Child psychotherapist listed on the Care Teams, will reach out to check for follow up needs.   Of note, [facility] Care Management services does not replace or interfere with any services that are arranged by inpatient case management or social work.  For additional questions or referrals please contact:    Charlesetta Shanks, RN BSN CCM [facility]  [phone] business mobile phone Toll free office [phone]  *Concierge Line  [phone] Fax number: [phone] [email or web address]

## 2022-08-21 MED ORDER — IPRATROPIUM-ALBUTEROL 0.5-2.5 (3) MG/3ML IN SOLN
3.0000 mL | Freq: Three times a day (TID) | RESPIRATORY_TRACT | Status: DC
  Administered 2022-08-22 – 2022-08-24 (×6): 3 mL via RESPIRATORY_TRACT
  Filled 2022-08-21 (×6): qty 3

## 2022-08-21 MED ORDER — BUDESONIDE 0.25 MG/2ML IN SUSP
0.2500 mg | Freq: Two times a day (BID) | RESPIRATORY_TRACT | Status: DC
  Administered 2022-08-21 – 2022-08-24 (×6): 0.25 mg via RESPIRATORY_TRACT
  Filled 2022-08-21 (×6): qty 2

## 2022-08-21 NOTE — Plan of Care (Signed)
Patient noted with dyspnea on exertion at the beginning of shift. She did require one dose of prn neb of this shift. Patient expressed fear of being discharge and have to return if symptoms hasn't resolve. Reassurance and emotional support given.    Problem: Education: Goal: Knowledge of disease or condition will improve Outcome: Progressing   Problem: Activity: Goal: Ability to tolerate increased activity will improve Outcome: Progressing Goal: Will verbalize the importance of balancing activity with adequate rest periods Outcome: Progressing   Problem: Respiratory: Goal: Ability to maintain a clear airway will improve Outcome: Progressing Goal: Levels of oxygenation will improve Outcome: Progressing

## 2022-08-21 NOTE — Plan of Care (Signed)
  Problem: Respiratory: Goal: Ability to maintain a clear airway will improve Outcome: Progressing   Problem: Clinical Measurements: Goal: Ability to maintain clinical measurements within normal limits will improve Outcome: Progressing   Problem: Activity: Goal: Risk for activity intolerance will decrease Outcome: Progressing   Problem: Nutrition: Goal: Adequate nutrition will be maintained Outcome: Progressing

## 2022-08-21 NOTE — Progress Notes (Signed)
HD#2 Subjective:   Summary: Patient is a 71 yo F with past medical history of COPD which is poorly controlled due to inability to afford medications, hypertension, type 2 diabetes, hyperlipidemia, and breast cancer in remission who presents with COPD exacerbation manifested as dyspnea and cough    Overnight Events: No acute events overnight   [name] was walking around the room in no acute distress, however when returning to the bed she had an acute coughing attack. She had resolution of this after a minute or so. She feels her breathing is much improved since admission, but that she continues to have coughing attacks and the need to stop and catch her breath when ambulating short distances. Discussed plan to continue current treatment with hopes of discharging home tomorrow.   Objective:  Vital signs in last 24 hours: Vitals:   08/21/22 0401 08/21/22 0537 08/21/22 0902 08/21/22 1603  BP:  (!) 141/77 (!) 171/75 (!) 157/74  Pulse:  (!) 101 99 98  Resp:  '18 18 18  '$ Temp:  98.3 F (36.8 C) 98.2 F (36.8 C) 97.9 F (36.6 C)  TempSrc:  Oral Oral Oral  SpO2: 98% 100% 96% 95%  Weight:       Supplemental O2: Room Air SpO2: 95 %   Physical Exam:  Constitutional: ill appearing HENT: normocephalic atraumatic, mucous membranes moist Eyes: conjunctiva non-erythematous Neck: supple Cardiovascular: regular rate and rhythm, no m/r/g Pulmonary/Chest: normal work of breathing on room air. No respiratory distress. Distant wheezing present in mid-airway. Upper airway wheezing absent.  MSK: normal bulk and tone Neurological: alert & oriented x 3 Skin: warm and dry Psych: normal mood  Filed Weights   08/14/22 1929  Weight: 74.8 kg     Intake/Output Summary (Last 24 hours) at 08/21/2022 1619 Last data filed at 08/21/2022 3154 Gross per 24 hour  Intake 480 ml  Output --  Net 480 ml   Net IO Since Admission: -4,020 mL [08/21/22 1619]  Pertinent Labs:    Latest Ref Rng &  Units 08/20/2022    4:02 AM 08/19/2022    3:58 AM 08/18/2022    3:12 AM  CBC  WBC 4.0 - 10.5 K/uL 15.7  12.9  11.1   Hemoglobin 12.0 - 15.0 g/dL 13.6  13.3  12.6   Hematocrit 36.0 - 46.0 % 38.6  39.1  36.0   Platelets 150 - 400 K/uL 326  323  300        Latest Ref Rng & Units 08/20/2022    4:02 AM 08/19/2022    3:58 AM 08/18/2022    3:12 AM  CMP  Glucose 70 - 99 mg/dL 184  170  114   BUN 8 - 23 mg/dL '11  10  9   '$ Creatinine 0.44 - 1.00 mg/dL 0.76  0.75  0.72   Sodium 135 - 145 mmol/L 133  131  135   Potassium 3.5 - 5.1 mmol/L 4.1  4.9  3.3   Chloride 98 - 111 mmol/L 97  95  97   CO2 22 - 32 mmol/L '25  27  28   '$ Calcium 8.9 - 10.3 mg/dL 8.8  9.2  8.8     Imaging: No results found.  Assessment/Plan:   Active Problems:   COPD with acute exacerbation (HCC)   COPD exacerbation [facility])   Patient Summary: [name] is a 71 yo F with past medical history of COPD, hypertension, type 2 diabetes, hyperlipidemia, and breast cancer who presents with shortness  of breath and cough admitted for COPD exacerbation.  #COPD with acute exacerbation, Gold Group E #Viral respiratory syndrome Patient's lung exam with improvement but still has wheezing and poor air movement. Believe additional day or two of IV solumedrol will improve her chances of success on the outpatient basis and enough time for her to have follow up in our clinic. She is unsure if ativan helped with upper airway spasms, not present today so will not repeat dose. Encouraged smoking cessation today, patient states at time difficult with family members smoking in the house. TOC consult placed to see if able to assist with obtaining medicines -Pulmicort twice daily -Duoneb q4 hr scheduled -Anoro Ellipta once daily -Albuterol prn -Solumedrol 125 mg IV daily, day 5 -Tussionex q12 hours prn, Mucinex DM twice daily, Tessalon capsules -Encourage smoking cessation   #Hypertension Improvement in BP, continue to monitor during  admission and encourage outpatient BP monitoring as well.  -Continue HCTZ 25 mg daily -Continue amlodipine 10 mg daily -Continue home losartan 100 mg daily   #Hyponatremia Stable at 133 yesterday thought to be due to diuretics. Repeat tomorrow -Continue to monitor   #Anxiety #Sleep Difficulty -Continue Cymbalta 60 mg daily -Continue ramelteon 8 mg nightly   Hx of Breast Cancer Pt with h/o breast CA s/p lumpectomy with chemo/radiation in 1991. Last in June 2021 per chart review. Recommended annual scans. -referral for outpatient mammogram   Dispo: Anticipated discharge to Home in 1-2 days pending further improvement in respiratory status.   [place] Internal Medicine Resident PGY-3 Please contact the on call pager after 5 pm and on weekends at [phone].

## 2022-08-21 NOTE — Progress Notes (Signed)
Mobility Specialist Progress Note:   08/21/22 0943  Mobility  Activity Ambulated with assistance in hallway  Level of Assistance Standby assist, set-up cues, supervision of patient - no hands on  Assistive Device Other (Comment) (Hall Rails)  Distance Ambulated (ft) 300 ft  Activity Response Tolerated fair  Mobility Referral Yes  $Mobility charge 1 Mobility   Pt received in bed and agreeable. Took 2x standing breaks d/t wheezing. No complaints of SOB. Pt left in bed with all needs met and call bell in reach.   [name] Mobility Specialist-Acute Rehab Secure Chat only

## 2022-08-22 LAB — BASIC METABOLIC PANEL
Anion gap: 13 (ref 5–15)
BUN: 15 mg/dL (ref 8–23)
CO2: 25 mmol/L (ref 22–32)
Calcium: 8.7 mg/dL — ABNORMAL LOW (ref 8.9–10.3)
Chloride: 91 mmol/L — ABNORMAL LOW (ref 98–111)
Creatinine, Ser: 0.93 mg/dL (ref 0.44–1.00)
GFR, Estimated: >60 mL/min (ref >60)
Glucose, Bld: 199 mg/dL — ABNORMAL HIGH (ref 70–99)
Potassium: 3.9 mmol/L (ref 3.5–5.1)
Sodium: 129 mmol/L — ABNORMAL LOW (ref 135–145)

## 2022-08-22 NOTE — Progress Notes (Addendum)
HD#3 Subjective:   Summary: Patient is a 71 yo F with past medical history of COPD which is poorly controlled due to inability to afford medications, hypertension, type 2 diabetes, hyperlipidemia, and breast cancer in remission who presents with COPD exacerbation manifested as dyspnea and cough.    Overnight Events: No acute events overnight  Patient endorses modest improvement since yesterday. She feels like her wheezing is better but still suffers from frequent severe coughing spells that lead to episodes of shortness of breath. She has a sore throat that she feels is related to her allergies and cough. She says she has a spray that helps with her sore throat. Also endorses a headache this morning similar to the one she had several days ago. She states she is able to fall asleep easily but will wake up occasionally with coughing fits. She endorses good appetite and hydration.  Objective:  Vital signs in last 24 hours: Vitals:   08/21/22 1603 08/21/22 1957 08/22/22 0401 08/22/22 0725  BP: (!) 157/74 (!) 142/66 (!) 140/76 (!) 141/70  Pulse: 98 99 92 (!) 101  Resp: '18 17 18 15  '$ Temp: 97.9 F (36.6 C) 98.3 F (36.8 C) 98.3 F (36.8 C) 98.6 F (37 C)  TempSrc: Oral  Oral Oral  SpO2: 95% 99% 100% 99%  Weight:       Supplemental O2: Room Air SpO2: 99 %   Physical Exam:  Constitutional: ill appearing, NAD Neck: supple Pulmonary/Chest: normal work of breathing on room air. No respiratory distress. Mild end expiratory wheezing bilaterally. Good air movement at bases. Upper airway wheezing absent.  MSK: normal bulk and tone Neurological: alert & oriented x 3 Skin: warm and dry Psych: normal mood  Filed Weights   08/14/22 1929  Weight: 74.8 kg     Intake/Output Summary (Last 24 hours) at 08/22/2022 1040 Last data filed at 08/22/2022 0806 Gross per 24 hour  Intake 360 ml  Output --  Net 360 ml   Net IO Since Admission: -3,660 mL [08/22/22 1040]  Pertinent Labs:     Latest Ref Rng & Units 08/20/2022    4:02 AM 08/19/2022    3:58 AM 08/18/2022    3:12 AM  CBC  WBC 4.0 - 10.5 K/uL 15.7  12.9  11.1   Hemoglobin 12.0 - 15.0 g/dL 13.6  13.3  12.6   Hematocrit 36.0 - 46.0 % 38.6  39.1  36.0   Platelets 150 - 400 K/uL 326  323  300        Latest Ref Rng & Units 08/22/2022    8:17 AM 08/20/2022    4:02 AM 08/19/2022    3:58 AM  CMP  Glucose 70 - 99 mg/dL 199  184  170   BUN 8 - 23 mg/dL '15  11  10   '$ Creatinine 0.44 - 1.00 mg/dL 0.93  0.76  0.75   Sodium 135 - 145 mmol/L 129  133  131   Potassium 3.5 - 5.1 mmol/L 3.9  4.1  4.9   Chloride 98 - 111 mmol/L 91  97  95   CO2 22 - 32 mmol/L '25  25  27   '$ Calcium 8.9 - 10.3 mg/dL 8.7  8.8  9.2     Imaging: No results found.  Assessment/Plan:   Active Problems:   COPD with acute exacerbation (HCC)   COPD exacerbation [facility])   Patient Summary: [name] is a 71 yo F with past medical history of COPD, hypertension,  type 2 diabetes, hyperlipidemia, and breast cancer who presents with shortness of breath and cough admitted for COPD exacerbation.  #COPD with acute exacerbation, Gold Group E #Viral respiratory syndrome Patient's lung exam with improvement but still has wheezing, though air movement improved at bases. Continues with severe coughing spells, has multiple during visit today. Believe this likely due to her viral URI possible worsened now with post-nasal drip. Continues to endorse cold symptoms including sore throat, nasal congestion, and headache intermittently. Feel that she has improved concerning COPD exacerbation but would like to ensure she has proper medications before discharge to prevent quick return to hospital. [facility] consult placed to see if able to assist with obtaining medicines -Pulmicort twice daily -Duoneb TID scheduled -Anoro Ellipta once daily -Albuterol prn -Solumedrol 125 mg IV daily, day 5 -Tussionex q12 hours prn, Mucinex DM twice daily, Tessalon capsules -Encourage  smoking cessation   #Hypertension Mildly hypertensive today. Continue to monitor during admission and encourage outpatient BP monitoring as well. -Continue HCTZ 25 mg daily -Continue amlodipine 10 mg daily -Continue home losartan 100 mg daily   #Hyponatremia Na 129. 131 when corrected for hyperglycemia. Likely related to thiazide use. Asymptomatic. Will consider modifying regimen if worsens. -Continue to monitor -repeat BMP tomorrow   #Anxiety #Sleep Difficulty -Continue Cymbalta 60 mg daily -Continue ramelteon 8 mg nightly   Hx of Breast Cancer Pt with h/o breast CA s/p lumpectomy with chemo/radiation in 1991. Last in June 2021 per chart review. Recommended annual scans. -referral for outpatient mammogram   Dispo: Anticipated discharge to Home in 1-2 days pending further improvement in respiratory status.   Linward Natal MD Internal Medicine Resident PGY-1 Please contact the on call pager after 5 pm and on weekends at [phone].

## 2022-08-22 NOTE — Plan of Care (Signed)

## 2022-08-22 NOTE — Progress Notes (Signed)
Mobility Specialist Progress Note:   08/22/22 1421  Mobility  Activity Ambulated with assistance in hallway  Level of Assistance Standby assist, set-up cues, supervision of patient - no hands on  Assistive Device Front wheel walker  Distance Ambulated (ft) 300 ft  Activity Response Tolerated well  Mobility Referral Yes  $Mobility charge 1 Mobility   Pt received in bed and agreeable. No complaints. Pt left in bed with all needs met and call bell in reach.   Jenavieve Freda Mobility Specialist-Acute Rehab Secure Chat only

## 2022-08-23 ENCOUNTER — Other Ambulatory Visit (HOSPITAL_COMMUNITY): Payer: Self-pay

## 2022-08-23 ENCOUNTER — Other Ambulatory Visit: Payer: Self-pay

## 2022-08-23 LAB — BASIC METABOLIC PANEL
Anion gap: 10 (ref 5–15)
BUN: 15 mg/dL (ref 8–23)
CO2: 27 mmol/L (ref 22–32)
Calcium: 8.6 mg/dL — ABNORMAL LOW (ref 8.9–10.3)
Chloride: 93 mmol/L — ABNORMAL LOW (ref 98–111)
Creatinine, Ser: 0.68 mg/dL (ref 0.44–1.00)
GFR, Estimated: >60 mL/min (ref >60)
Glucose, Bld: 198 mg/dL — ABNORMAL HIGH (ref 70–99)
Potassium: 3.9 mmol/L (ref 3.5–5.1)
Sodium: 130 mmol/L — ABNORMAL LOW (ref 135–145)

## 2022-08-23 MED ORDER — METOPROLOL TARTRATE 25 MG PO TABS
25.0000 mg | ORAL_TABLET | Freq: Two times a day (BID) | ORAL | 5 refills | Status: DC
  Filled 2022-08-23: qty 60, 30d supply, fill #0

## 2022-08-23 MED ORDER — HYDROCHLOROTHIAZIDE 25 MG PO TABS
25.0000 mg | ORAL_TABLET | Freq: Every day | ORAL | 5 refills | Status: DC
  Filled 2022-08-23: qty 30, 30d supply, fill #0

## 2022-08-23 MED ORDER — LOSARTAN POTASSIUM 100 MG PO TABS
100.0000 mg | ORAL_TABLET | Freq: Every day | ORAL | 5 refills | Status: DC
  Filled 2022-08-23: qty 30, 30d supply, fill #0

## 2022-08-23 MED ORDER — ALBUTEROL SULFATE HFA 108 (90 BASE) MCG/ACT IN AERS
1.0000 | INHALATION_SPRAY | Freq: Four times a day (QID) | RESPIRATORY_TRACT | 3 refills | Status: DC | PRN
  Filled 2022-08-23: qty 18, 25d supply, fill #0

## 2022-08-23 MED ORDER — FLUTICASONE-SALMETEROL 250-50 MCG/ACT IN AEPB
1.0000 | INHALATION_SPRAY | Freq: Two times a day (BID) | RESPIRATORY_TRACT | 1 refills | Status: DC
  Filled 2022-08-23: qty 60, 30d supply, fill #0

## 2022-08-23 MED ORDER — SIMVASTATIN 40 MG PO TABS
40.0000 mg | ORAL_TABLET | Freq: Every evening | ORAL | 5 refills | Status: DC
  Filled 2022-08-23: qty 30, 30d supply, fill #0

## 2022-08-23 MED ORDER — ACETAMINOPHEN 325 MG PO TABS: 650.0000 mg | ORAL_TABLET | Freq: Four times a day (QID) | ORAL | Status: DC | PRN

## 2022-08-23 MED ORDER — TRELEGY ELLIPTA 100-62.5-25 MCG/ACT IN AEPB
INHALATION_SPRAY | Freq: Two times a day (BID) | RESPIRATORY_TRACT | 0 refills | Status: DC
  Filled 2022-08-23: qty 4, 30d supply, fill #0
  Filled 2022-08-24: qty 60, 30d supply, fill #0

## 2022-08-23 MED ORDER — DULOXETINE HCL 60 MG PO CPEP
60.0000 mg | ORAL_CAPSULE | Freq: Every day | ORAL | 5 refills | Status: DC
  Filled 2022-08-23: qty 30, 30d supply, fill #0

## 2022-08-23 MED ORDER — AMLODIPINE BESYLATE 10 MG PO TABS
10.0000 mg | ORAL_TABLET | Freq: Every day | ORAL | 5 refills | Status: DC
  Filled 2022-08-23: qty 30, 30d supply, fill #0

## 2022-08-23 NOTE — TOC Progression Note (Addendum)
Transition of Care [facility]) - Progression Note    Patient Details  Name: [name] MRN: [id number] Date of Birth: 1951/04/17  [facility]) CM/SW [place], RN Phone Number: 08/23/2022, 3:19 PM  Clinical Narrative:    CM called and spoke with attending physician and pharmacist at [facility] and the patient would be able to get the most affordable medication assistance through [place].  The attending physician states that they plan to discharge the patient home tomorrow with family.  Cost of the medications through [facility] pharmacy will be provided by Georgiann Mohs, pharmacist and I will update the patient.  Family plans to provide transportation to home by car tomorrow.  08/23/2022 1622- [facility] was unable to assist with total cost of medications of 317.00 since the patient has Insurance with no part D.  No patient assistance available.  Discharge medications to be called in to [facility] and Georgiann Mohs, pharmacy to determine best affordability for meds.  The patient has nebulizer machine at home and adjustments will be made before patient is discharged home with family tomorrow by car.   Expected Discharge Plan: Home/Self Care Barriers to Discharge: Continued Medical Work up  Expected Discharge Plan and Services Expected Discharge Plan: Home/Self Care   Discharge Planning Services: CM Consult, Medication Assistance   Living arrangements for the past 2 months: Single Family Home                                       Social Determinants of Health (SDOH) Interventions    Readmission Risk Interventions    08/20/2022   11:07 AM  Readmission Risk Prevention Plan  Post Dischage Appt Complete  Medication Screening Complete  Transportation Screening Complete

## 2022-08-23 NOTE — Progress Notes (Addendum)
HD#4 Subjective:   Summary: Patient is a 71 yo F with past medical history of COPD which is poorly controlled due to inability to afford medications, hypertension, type 2 diabetes, hyperlipidemia, and breast cancer in remission who presents with COPD exacerbation manifested as dyspnea and cough.    Overnight Events: No acute events overnight  Patient states she had an episode of gagging and emesis this morning after receiving some medication sprayed into her mouth. States it caused her to heave when it hit the back of her throat. Symptoms have resolved. Continues to suffer from coughing spells and has several during our visit. She feels her wheezing has improved though. Reports she was unable to sleep well last night and woke up with sore throat and ear pain. We discussed that we are working together to create a safe discharge plan though this has been complicated by financial barriers and lack of insurance coverage for her COPD medications.  Objective:  Vital signs in last 24 hours: Vitals:   08/23/22 0339 08/23/22 0724 08/23/22 0856 08/23/22 1317  BP: 131/76  (!) 143/67   Pulse: 100 92 (!) 101 98  Resp: '19 18 16 18  '$ Temp: 97.8 F (36.6 C)  98.8 F (37.1 C)   TempSrc: Oral  Oral   SpO2: 99%  99%   Weight:       Supplemental O2: Room Air SpO2: 99 %   Physical Exam:  Constitutional: ill appearing,  Pulmonary/Chest: normal work of breathing on room air. No respiratory distress until she experiences a cough paroxysm, which she did frequently during eval.  Mild end expiratory wheezing bilaterally. Good air movement at bases. Upper airway wheezing absent.  Neurological: alert & oriented x 3 Skin: warm and dry Mood/affect: Tired, depressed  Filed Weights   08/14/22 1929  Weight: 74.8 kg    No intake or output data in the 24 hours ending 08/23/22 1456  Net IO Since Admission: -3,540 mL [08/23/22 1456]  Pertinent Labs:    Latest Ref Rng & Units 08/20/2022    4:02 AM  08/19/2022    3:58 AM 08/18/2022    3:12 AM  CBC  WBC 4.0 - 10.5 K/uL 15.7  12.9  11.1   Hemoglobin 12.0 - 15.0 g/dL 13.6  13.3  12.6   Hematocrit 36.0 - 46.0 % 38.6  39.1  36.0   Platelets 150 - 400 K/uL 326  323  300        Latest Ref Rng & Units 08/23/2022    5:14 AM 08/22/2022    8:17 AM 08/20/2022    4:02 AM  CMP  Glucose 70 - 99 mg/dL 198  199  184   BUN 8 - 23 mg/dL '15  15  11   '$ Creatinine 0.44 - 1.00 mg/dL 0.68  0.93  0.76   Sodium 135 - 145 mmol/L 130  129  133   Potassium 3.5 - 5.1 mmol/L 3.9  3.9  4.1   Chloride 98 - 111 mmol/L 93  91  97   CO2 22 - 32 mmol/L '27  25  25   '$ Calcium 8.9 - 10.3 mg/dL 8.6  8.7  8.8     Imaging: No results found.  Assessment/Plan:   Active Problems:   COPD with acute exacerbation (HCC)   COPD exacerbation [facility])   Patient Summary: [name] is a 71 yo F with past medical history of COPD, hypertension, type 2 diabetes, hyperlipidemia, and breast cancer who presents with shortness of  breath and cough admitted for COPD exacerbation.  #COPD with acute exacerbation, Gold Group E #Viral respiratory syndrome Patient's lung exam with improvement  with faint wheezes bilaterally. Continues with severe coughing spells, has multiple during visit today. Believe  due to her viral URI possibly worsened now with post-nasal drip. Continues to endorse cold symptoms including sore throat, nasal congestion, and preauricular pain. Feel that she has improved concerning COPD exacerbation but would like to ensure she has proper medications before discharge to prevent quick return to hospital. [facility] We will see what is covered as this should be heavily subsidized. Per patient advocate at this pharmacy, patient does have Medicare Part D but would have to meet $600 deductible before it kicks in. -Pulmicort twice daily -Duoneb TID scheduled (last week her prior Josem Kaufmann was denied for this; we have 8 cartridges  of albuterol solution from clinic samples to give her; this will last 2 days...) -Anoro Ellipta once daily, samples for two weeks will be provided from our [facility] -Albuterol prn -Solumedrol 125 mg IV daily, day 6 (change to oral prednisone - she has already rec'd her solumedrol for today) -Tussionex q12 hours prn, Mucinex DM twice daily, Tessalon capsules while here.  Doubt if she'll be able to afford any of this at discharge. -Encourage smoking cessation   #Hypertension Mildly hypertensive again today on home regimen. -Continue HCTZ 25 mg daily -Continue amlodipine 10 mg daily -Continue home losartan 100 mg daily   #Hyponatremia, mild, due to HCTZ Na 130. 132 when corrected for hyperglycemia. Likely related to thiazide use. Asymptomatic. Will consider modifying regimen if worsens. -Continue to monitor -repeat BMP tomorrow   #Anxiety- chronic #Sleep Difficulty- chronic -Continue Cymbalta 60 mg daily -Continue ramelteon 8 mg nightly   Hx of Breast Cancer Pt with h/o breast CA s/p lumpectomy with chemo/radiation in 1991. Last in June 2021 per chart review. Recommended annual scans. -referral for outpatient mammogram   Dispo: Anticipated discharge to Home in 1-2 days pending further improvement in respiratory status.   Linward Natal MD Internal Medicine Resident PGY-1 Please contact the on call pager after 5 pm and on weekends at [phone].

## 2022-08-23 NOTE — Progress Notes (Signed)
Mobility Specialist Progress Note:   08/23/22 1356  Mobility  Activity Ambulated with assistance in hallway  Level of Assistance Standby assist, set-up cues, supervision of patient - no hands on  Assistive Device Other (Comment) (Hall Rails)  Distance Ambulated (ft) 80 ft  Activity Response Tolerated fair  Mobility Referral Yes  $Mobility charge 1 Mobility   Pt received in bed and agreeable. C/o of fatigue from vomiting this morning. Deferred further mobility d/t fatigue. Pt left in bed with all needs met and call bell in reach.   Vona Whiters Mobility Specialist-Acute Rehab Secure Chat only

## 2022-08-24 ENCOUNTER — Other Ambulatory Visit: Payer: Self-pay

## 2022-08-24 ENCOUNTER — Other Ambulatory Visit (HOSPITAL_COMMUNITY): Payer: Self-pay

## 2022-08-24 LAB — BASIC METABOLIC PANEL
Anion gap: 12 (ref 5–15)
BUN: 14 mg/dL (ref 8–23)
CO2: 29 mmol/L (ref 22–32)
Calcium: 8.6 mg/dL — ABNORMAL LOW (ref 8.9–10.3)
Chloride: 88 mmol/L — ABNORMAL LOW (ref 98–111)
Creatinine, Ser: 0.8 mg/dL (ref 0.44–1.00)
GFR, Estimated: >60 mL/min (ref >60)
Glucose, Bld: 269 mg/dL — ABNORMAL HIGH (ref 70–99)
Potassium: 4.3 mmol/L (ref 3.5–5.1)
Sodium: 129 mmol/L — ABNORMAL LOW (ref 135–145)

## 2022-08-24 LAB — GLUCOSE, CAPILLARY
Glucose-Capillary: 255 mg/dL — ABNORMAL HIGH (ref 70–99)
Glucose-Capillary: 174 mg/dL — ABNORMAL HIGH (ref 70–99)

## 2022-08-24 MED ORDER — HYDROCOD POLI-CHLORPHE POLI ER 10-8 MG/5ML PO SUER
5.0000 mL | Freq: Two times a day (BID) | ORAL | 0 refills | Status: DC | PRN
  Filled 2022-08-24: qty 115, 12d supply, fill #0

## 2022-08-24 MED ORDER — ALBUTEROL SULFATE (2.5 MG/3ML) 0.083% IN NEBU: 2.5000 mg | INHALATION_SOLUTION | RESPIRATORY_TRACT | 0 refills | Status: DC | PRN

## 2022-08-24 MED ORDER — INSULIN ASPART 100 UNIT/ML IJ SOLN
0.0000 [IU] | Freq: Three times a day (TID) | INTRAMUSCULAR | Status: DC
  Administered 2022-08-24: 3 [IU] via SUBCUTANEOUS
  Administered 2022-08-24: 8 [IU] via SUBCUTANEOUS

## 2022-08-24 MED ORDER — UMECLIDINIUM-VILANTEROL 62.5-25 MCG/ACT IN AEPB: 1.0000 | INHALATION_SPRAY | Freq: Every day | RESPIRATORY_TRACT | 0 refills | Status: AC

## 2022-08-24 MED ORDER — PREDNISONE 20 MG PO TABS
40.0000 mg | ORAL_TABLET | Freq: Every day | ORAL | Status: DC
  Administered 2022-08-24: 40 mg via ORAL
  Filled 2022-08-24: qty 2

## 2022-08-24 MED ORDER — PREDNISONE 20 MG PO TABS
40.0000 mg | ORAL_TABLET | Freq: Every day | ORAL | 0 refills | Status: DC
  Filled 2022-08-24: qty 10, 5d supply, fill #0

## 2022-08-24 NOTE — Progress Notes (Signed)
Pt family has arrived, ready for discharge, IV removed, discharge instructions in pts hand, tech to wheel pt to valet, no further needs at this time

## 2022-08-24 NOTE — Discharge Summary (Signed)
Name: [name] MRN: [id number] DOB: 01/20/1951 71 y.o. PCP: Nani Gasser, MD  Date of Admission: 08/14/2022  1:41 PM Date of Discharge: 08/24/2022 Attending Physician: Angelica Pou, MD  Discharge Diagnosis: 1. Active Problems:   COPD with acute exacerbation (HCC)   COPD exacerbation ([place])   Discharge Medications: Allergies as of 08/24/2022       Reactions   Ace Inhibitors Swelling   Throat swelling. Lisinopril-HCTZ Pt states she in not allergic         Medication List     STOP taking these medications    ipratropium-albuterol 0.5-2.5 (3) MG/3ML Soln Commonly known as: DUONEB   loratadine 10 MG tablet Commonly known as: Claritin   mirtazapine 15 MG tablet Commonly known as: REMERON   Neti Pot Sinus Wash 2300-700 MG Kit Generic drug: Sodium Chloride-Sodium Bicarb   OVER THE COUNTER MEDICATION   pregabalin 100 MG capsule Commonly known as: Lyrica   Trelegy Ellipta 100-62.5-25 MCG/ACT Aepb Generic drug: Fluticasone-Umeclidin-Vilant       TAKE these medications    acetaminophen 325 MG tablet Commonly known as: TYLENOL Take 2 tablets (650 mg total) by mouth every 6 (six) hours as needed for mild pain (or Fever >/= 101).   amLODipine 10 MG tablet Commonly known as: NORVASC Take 1 tablet (10 mg total) by mouth daily.   chlorpheniramine-HYDROcodone 10-8 MG/5ML Commonly known as: TUSSIONEX Take 5 mLs by mouth every 12 (twelve) hours as needed for cough.   DULoxetine 60 MG capsule Commonly known as: CYMBALTA Take 1 capsule (60 mg total) by mouth daily.   glucose blood test strip Commonly known as: OneTouch Verio Use to check blood sugar once daily. (E11.8)   hydrochlorothiazide 25 MG tablet Commonly known as: HYDRODIURIL Take 1 tablet (25 mg total) by mouth daily.   losartan 100 MG tablet Commonly known as: COZAAR Take 1 tablet (100 mg total) by mouth daily.   metoprolol tartrate 25 MG tablet Commonly known as:  LOPRESSOR Take 1 tablet (25 mg total) by mouth 2 (two) times daily.   OneTouch Delica Lancets 60V Misc Use to check blood sugar once daily (E11.8)   OneTouch Verio w/Device Kit Use to check blood sugar once daily. (E11.8)   predniSONE 20 MG tablet Commonly known as: DELTASONE Take 2 tablets (40 mg total) by mouth daily with breakfast for 5 days. Start taking on: August 25, 2022   simvastatin 40 MG tablet Commonly known as: ZOCOR Take 1 tablet (40 mg total) by mouth every evening.   umeclidinium-vilanterol 62.5-25 MCG/ACT Aepb Commonly known as: ANORO ELLIPTA Inhale 1 puff into the lungs daily for 14 days. Start taking on: August 25, 2022   Ventolin HFA 108 (90 Base) MCG/ACT inhaler Generic drug: albuterol Inhale 1-2 puffs into the lungs every 6 (six) hours as needed for wheezing or shortness of breath. What changed: Another medication with the same name was added. Make sure you understand how and when to take each.   albuterol (2.5 MG/3ML) 0.083% nebulizer solution Commonly known as: PROVENTIL Take 3 mLs (2.5 mg total) by nebulization every 4 (four) hours as needed for up to 8 doses for wheezing or shortness of breath. What changed: You were already taking a medication with the same name, and this prescription was added. Make sure you understand how and when to take each.        Disposition and follow-up:   Ms.Calvin A Middendorf was discharged from [facility] in fair condition.  At the hospital follow up visit please address:  1.  COPD with acute exacerbation, Gold Group E: Patient's lung exam much improved a day of discharge without any wheezing.  Continues with coughing spells.  Please ensure continued resolution of symptoms.  She will be discharged with five additional days of prednisone 20m. 14 days of umeclidinium-vilanterol (anoro ellipta) and albuterol nebs 8 doses from our clinic supply. She will need the assistance of CRosendo Grosto see if she  qualifies for medication assistance from any other inhaler manufactures.   It may also be worthwhile to see if[facility], that patient could be set up with payment plan for her medications. Also continue to emphasize that patient should stop smoking.   Hypertension: Patient remained mildly hypertensive after being restarted on her home regimen during her admission.  Please follow-up to ensure that her blood pressure continues to improve as her COPD exacerbation and viral illness subside.    Hyponatremia: Patient was mildly hyponatremic.  Consider modifying blood pressure regimen if this continues/worsens as she is on thiazide diuretic. Repeat BMP.  Hx of Breast Cancer Pt with h/o breast CA s/p lumpectomy with chemo/radiation in 1991. Last in June 2021 per chart review. Recommended annual scans. Please follow-up to ensure patient is referred for and completes mammogram as she is overdue.  2.  Labs / imaging needed at time of follow-up: BMP  3.  Pending labs/ test needing follow-up: None  Follow-up Appointments:  Follow-up Information     Masters, KJoellen [place] DO Follow up.   Specialty: Internal Medicine Why: You are scheduled for a hospital follow up on September 06, 2022 at 2:15 pm with Dr. KChristiana Fuchs MD. Contact information: [id number]: 1.  COPD exacerbation: Patient presented with shortness of breath and cough.  She was prescribed Trelegy Ellipta, albuterol, and DuoNebs at home but was only able to use DuoNebs due to financial barriers.  She has been getting by on samples from our clinic.    When she was initially admitted, she received 125 mg of Solu-Medrol, DuoNebs, magnesium sulfate, Tessalon capsules.  Work of breathing improved but still wheezing diffusely in the emergency department.  Proceeded with hour-long continuous albuterol neb with some  improvement.  Chest x-ray without acute cardiopulmonary disease but patient found to be positive for rhinovirus.  CBC and BMP unremarkable.  Patient oxygenating well throughout hospitalization.  She was hospitalized last year for exacerbation.  PFTs with FEV1 / FVC of 60.  Patient was started on ipratropium nebs every 4 hours, Anoro Ellipta once daily, and prednisone 40 mg p.o. daily.  Several days into admission, patient was started on IV Solu-Medrol given symptoms refractory to p.o. steroids.  Her wheezing eventually subsided though her coughing spells persist.  She was on many medications for her cough including Tussionex twice daily, Mucinex, and Tessalon capsules.  Her discharge was complicated by inability to ensure safe discharge given medication accessibility due to financial constraints. Patient will ultimately discharge on additional 5 days of prednisone, samples of anoro ellipta, and tussionex for her cough.   Discharge Exam:   BP 101/65 (BP Location: Right Arm)   Pulse (!) 58   Temp 98.9 F (37.2 C) (Oral)   Resp 18   Wt 74.8 kg   SpO2 94%   BMI 25.83 kg/m  Discharge exam:  Constitutional: Well-developed and in no distress.  Cardiovascular: Normal rate, regular rhythm, intact distal pulses. No gallop and no friction rub.  No murmur heard. No lower extremity edema  Pulmonary: Non labored breathing on room air, no wheezing or rales  Abdominal: Soft. Normal bowel sounds. Non distended and non tender Musculoskeletal: Normal range of motion.        General: No tenderness or edema.  Neurological: Alert and oriented to person, place, and time. Non focal  Skin: Skin is warm and dry.    Pertinent Labs, Studies, and Procedures:     Latest Ref Rng & Units 08/20/2022    4:02 AM 08/19/2022    3:58 AM 08/18/2022    3:12 AM  CBC  WBC 4.0 - 10.5 K/uL 15.7  12.9  11.1   Hemoglobin 12.0 - 15.0 g/dL 13.6  13.3  12.6   Hematocrit 36.0 - 46.0 % 38.6  39.1  36.0   Platelets 150 - 400 K/uL  326  323  300       Latest Ref Rng & Units 08/24/2022    3:59 AM 08/23/2022    5:14 AM 08/22/2022    8:17 AM  CMP  Glucose 70 - 99 mg/dL 269  198  199   BUN 8 - 23 mg/dL _0 Creatinine 0.44 - 1.00 mg/dL 0.80  0.68  0.93   Sodium 135 - 145 mmol/L 129  130  129   Potassium 3.5 - 5.1 mmol/L 4.3  3.9  3.9   Chloride 98 - 111 mmol/L 88  93  91   CO2 22 - 32 mmol/L _1 Calcium 8.9 - 10.3 mg/dL 8.6  8.6  8.7    DG Chest Port 1 View  Result Date: 08/14/2022 CLINICAL DATA:  Short of breath.  Wheezing. EXAM: PORTABLE CHEST 1 VIEW COMPARISON:  11/03/2021. FINDINGS: Cardiac silhouette is normal in size. No mediastinal or hilar masses. Lungs are hyperexpanded. Mild chronic interstitial thickening in the lower lungs. No evidence of pneumonia or pulmonary edema. No pleural effusion or pneumothorax. Right axillary vascular clips, stable. Skeletal structures are grossly intact. IMPRESSION: No acute cardiopulmonary disease. Electronically Signed   By: Lajean Manes M.D.   On: 08/14/2022 14:32      Signed: Rick Duff, MD 08/24/2022, 3:43 PM   Pager: [phone]

## 2022-08-24 NOTE — TOC Transition Note (Signed)
Transition of Care [facility]) - CM/SW Discharge Note   Patient Details  Name: [name] MRN: [id number] Date of Birth: Nov 26, 1950  [facility]) CM/SW Contact:  Curlene Labrum, RN Phone Number: 08/24/2022, 3:08 PM   Clinical Narrative:    CM met with the patient at the bedside to discuss discharge plans to home today.  The patient's discharge medications will be provided through [facility] since the patient does not have funds to afford her discharge medications.  The patient has Medicare A and B but does not have D to cover expense of medications.  I spoke with Georgiann Mohs, Pharmacist and the patient will be billed for discharge medications for home since the patient does not have cash or card to pay for her medications today.  A hospital follow up was scheduled with Dr. Belenda Cruise - noted in the discharge instructions.  The patient's family will be providing transportation to home. Bedside nursing to coordinate with the family.   Final next level of care: Home/Self Care Barriers to Discharge: Continued Medical Work up   Patient Goals and CMS Choice Patient states their goals for this hospitalization and ongoing recovery are:: to return home CMS Medicare.gov Compare Post Acute Care list provided to:: Patient Choice offered to / list presented to : Patient  Discharge Placement                       Discharge Plan and Services   Discharge Planning Services: CM Consult, Medication Assistance                                 Social Determinants of Health (SDOH) Interventions     Readmission Risk Interventions    08/20/2022   11:07 AM  Readmission Risk Prevention Plan  Post Dischage Appt Complete  Medication Screening Complete  Transportation Screening Complete

## 2022-08-25 ENCOUNTER — Telehealth: Payer: Self-pay

## 2022-08-25 ENCOUNTER — Other Ambulatory Visit (HOSPITAL_COMMUNITY): Payer: Self-pay

## 2022-08-25 ENCOUNTER — Ambulatory Visit: Payer: Self-pay | Admitting: Licensed Clinical Social Worker

## 2022-08-25 NOTE — Patient Outreach (Signed)
  Care Coordination [facility] Note Transition Care Management Unsuccessful Follow-up Telephone Call  Date of discharge and from [facility]:  [name] 08/14/22-08/24/22  Attempts:  1st Attempt  Reason for unsuccessful TCM follow-up call:  No answer/busy  Johnney Killian, RN, BSN, CCM Care Management Coordinator [facility] Phone: [phone]: [phone]

## 2022-08-25 NOTE — Consult Note (Signed)
[facility] CM Inpatient Consult   08/25/2022  [name] [birth year] [id number]  Late entry: 11:33 am 08/24/22  Spoke with patient via cell phone listed as she did not answer bedside phone, regarding post hospital follow up.  Patient agrees and endorses follow up with PCP, [facility] with care coordination team.  Charlesetta Shanks, RN BSN CCM [facility]  [phone] business mobile phone Toll free office [phone]  *Concierge Line  [phone] Fax number: [phone] [email or web address]

## 2022-08-25 NOTE — Patient Outreach (Signed)
  Care Coordination   Follow Up Visit Note   08/25/2022 Name: CHIARA COLTRIN MRN: [id number] DOB: 01-28-1951  [name] is a 71 y.o. year old female who sees Nani Gasser, MD for primary care. SW attempted to contact patient to notify of SW removing herself from her careteam. Patient may contact SW if future needs arise.   SDOH assessments and interventions completed:  Yes     Care Coordination Interventions Activated:  Yes  Care Coordination Interventions:  Yes, provided   Follow up plan: No further intervention required.   Encounter Outcome:  Pt. Visit Completed

## 2022-08-26 ENCOUNTER — Telehealth: Payer: Self-pay

## 2022-08-26 NOTE — Patient Outreach (Signed)
  Care Coordination [facility] Note Transition Care Management Unsuccessful Follow-up Telephone Call  Date of discharge and from where:  [name] 08/14/22-08/24/22  Attempts:  2nd Attempt  Reason for unsuccessful TCM follow-up call:  Left voice message  Johnney Killian, RN, BSN, CCM Care Management Coordinator [facility] Phone: [phone]: [phone]

## 2022-08-30 ENCOUNTER — Encounter: Payer: Self-pay | Admitting: Student

## 2022-08-30 ENCOUNTER — Observation Stay (HOSPITAL_COMMUNITY)
Admission: RE | Admit: 2022-08-30 | Discharge: 2022-08-30 | Disposition: A | Discharge status: Home / Self Care | Payer: Medicare Other | Source: Ambulatory Visit | Attending: Internal Medicine | Admitting: Internal Medicine

## 2022-08-30 ENCOUNTER — Ambulatory Visit (INDEPENDENT_AMBULATORY_CARE_PROVIDER_SITE_OTHER): Payer: Medicare Other | Admitting: Student

## 2022-08-30 ENCOUNTER — Telehealth: Payer: Self-pay | Admitting: *Deleted

## 2022-08-30 ENCOUNTER — Inpatient Hospital Stay (HOSPITAL_COMMUNITY)
Admission: RE | Admit: 2022-08-30 | Discharge: 2022-09-06 | DRG: 571 | Disposition: A | Discharge status: Home / Self Care | Payer: Medicare Other | Source: Ambulatory Visit | Attending: Student in an Organized Health Care Education/Training Program | Admitting: Student in an Organized Health Care Education/Training Program

## 2022-08-30 ENCOUNTER — Encounter (HOSPITAL_COMMUNITY): Payer: Self-pay

## 2022-08-30 ENCOUNTER — Other Ambulatory Visit: Payer: Self-pay

## 2022-08-30 VITALS — BP 145/74 | HR 56 | Temp 98.6°F | Ht 66.0 in | Wt 174.2 lb

## 2022-08-30 DIAGNOSIS — L0201 Cutaneous abscess of face: Secondary | ICD-10-CM

## 2022-08-30 DIAGNOSIS — E119 Type 2 diabetes mellitus without complications: Secondary | ICD-10-CM | POA: Diagnosis present

## 2022-08-30 DIAGNOSIS — Z923 Personal history of irradiation: Secondary | ICD-10-CM | POA: Diagnosis not present

## 2022-08-30 DIAGNOSIS — Z79899 Other long term (current) drug therapy: Secondary | ICD-10-CM

## 2022-08-30 DIAGNOSIS — J340 Abscess, furuncle and carbuncle of nose: Secondary | ICD-10-CM | POA: Diagnosis not present

## 2022-08-30 DIAGNOSIS — J320 Chronic maxillary sinusitis: Secondary | ICD-10-CM | POA: Diagnosis present

## 2022-08-30 DIAGNOSIS — F419 Anxiety disorder, unspecified: Secondary | ICD-10-CM | POA: Diagnosis present

## 2022-08-30 DIAGNOSIS — I10 Essential (primary) hypertension: Secondary | ICD-10-CM | POA: Diagnosis present

## 2022-08-30 DIAGNOSIS — E118 Type 2 diabetes mellitus with unspecified complications: Secondary | ICD-10-CM | POA: Diagnosis present

## 2022-08-30 DIAGNOSIS — G8929 Other chronic pain: Secondary | ICD-10-CM

## 2022-08-30 DIAGNOSIS — J3489 Other specified disorders of nose and nasal sinuses: Secondary | ICD-10-CM | POA: Diagnosis not present

## 2022-08-30 DIAGNOSIS — L03211 Cellulitis of face: Secondary | ICD-10-CM | POA: Diagnosis present

## 2022-08-30 DIAGNOSIS — J4489 Other specified chronic obstructive pulmonary disease: Secondary | ICD-10-CM | POA: Diagnosis present

## 2022-08-30 DIAGNOSIS — G9389 Other specified disorders of brain: Secondary | ICD-10-CM | POA: Diagnosis not present

## 2022-08-30 DIAGNOSIS — F17210 Nicotine dependence, cigarettes, uncomplicated: Secondary | ICD-10-CM | POA: Diagnosis not present

## 2022-08-30 DIAGNOSIS — Z6828 Body mass index (BMI) 28.0-28.9, adult: Secondary | ICD-10-CM | POA: Diagnosis not present

## 2022-08-30 DIAGNOSIS — Z853 Personal history of malignant neoplasm of breast: Secondary | ICD-10-CM

## 2022-08-30 DIAGNOSIS — R600 Localized edema: Secondary | ICD-10-CM | POA: Diagnosis present

## 2022-08-30 DIAGNOSIS — E785 Hyperlipidemia, unspecified: Secondary | ICD-10-CM | POA: Diagnosis present

## 2022-08-30 DIAGNOSIS — Z794 Long term (current) use of insulin: Secondary | ICD-10-CM | POA: Diagnosis not present

## 2022-08-30 DIAGNOSIS — J441 Chronic obstructive pulmonary disease with (acute) exacerbation: Secondary | ICD-10-CM

## 2022-08-30 DIAGNOSIS — M1990 Unspecified osteoarthritis, unspecified site: Secondary | ICD-10-CM | POA: Diagnosis present

## 2022-08-30 DIAGNOSIS — J449 Chronic obstructive pulmonary disease, unspecified: Secondary | ICD-10-CM | POA: Diagnosis present

## 2022-08-30 DIAGNOSIS — J329 Chronic sinusitis, unspecified: Secondary | ICD-10-CM | POA: Diagnosis present

## 2022-08-30 DIAGNOSIS — Z9221 Personal history of antineoplastic chemotherapy: Secondary | ICD-10-CM

## 2022-08-30 DIAGNOSIS — E669 Obesity, unspecified: Secondary | ICD-10-CM | POA: Diagnosis present

## 2022-08-30 DIAGNOSIS — M25551 Pain in right hip: Secondary | ICD-10-CM

## 2022-08-30 DIAGNOSIS — F17200 Nicotine dependence, unspecified, uncomplicated: Secondary | ICD-10-CM | POA: Diagnosis not present

## 2022-08-30 DIAGNOSIS — K122 Cellulitis and abscess of mouth: Secondary | ICD-10-CM | POA: Diagnosis not present

## 2022-08-30 HISTORY — DX: Cellulitis of face: L03.211

## 2022-08-30 LAB — CBC
HCT: 33.2 % — ABNORMAL LOW (ref 36.0–46.0)
Hemoglobin: 11.7 g/dL — ABNORMAL LOW (ref 12.0–15.0)
MCH: 32.2 pg (ref 26.0–34.0)
MCHC: 35.2 g/dL (ref 30.0–36.0)
MCV: 91.5 fL (ref 80.0–100.0)
Platelets: 356 10*3/uL (ref 150–400)
RBC: 3.63 MIL/uL — ABNORMAL LOW (ref 3.87–5.11)
RDW: 12.4 % (ref 11.5–15.5)
WBC: 15.5 10*3/uL — ABNORMAL HIGH (ref 4.0–10.5)
nRBC: 0 % (ref 0.0–0.2)

## 2022-08-30 MED ORDER — ALBUTEROL SULFATE HFA 108 (90 BASE) MCG/ACT IN AERS: 1.0000 | INHALATION_SPRAY | Freq: Four times a day (QID) | RESPIRATORY_TRACT | Status: DC | PRN

## 2022-08-30 MED ORDER — NAPROXEN 500 MG PO TABS: 500.0000 mg | ORAL_TABLET | Freq: Two times a day (BID) | ORAL | 0 refills | Status: AC

## 2022-08-30 MED ORDER — DM-GUAIFENESIN ER 30-600 MG PO TB12: 1.0000 | ORAL_TABLET | Freq: Two times a day (BID) | ORAL | Status: DC | PRN

## 2022-08-30 MED ORDER — GUAIFENESIN ER 600 MG PO TB12
600.0000 mg | ORAL_TABLET | Freq: Two times a day (BID) | ORAL | Status: DC | PRN
  Administered 2022-08-31 – 2022-09-02 (×3): 600 mg via ORAL
  Filled 2022-08-30 (×3): qty 1

## 2022-08-30 MED ORDER — ACETAMINOPHEN 325 MG PO TABS
650.0000 mg | ORAL_TABLET | Freq: Four times a day (QID) | ORAL | Status: DC | PRN
  Administered 2022-08-31 – 2022-09-02 (×6): 650 mg via ORAL
  Filled 2022-08-30 (×7): qty 2

## 2022-08-30 MED ORDER — IPRATROPIUM-ALBUTEROL 0.5-2.5 (3) MG/3ML IN SOLN: 3.0000 mL | Freq: Once | RESPIRATORY_TRACT | Status: DC

## 2022-08-30 MED ORDER — IOHEXOL 350 MG/ML SOLN
100.0000 mL | Freq: Once | INTRAVENOUS | Status: AC | PRN
  Administered 2022-08-30: 80 mL via INTRAVENOUS

## 2022-08-30 MED ORDER — IBUPROFEN 200 MG PO TABS
400.0000 mg | ORAL_TABLET | Freq: Once | ORAL | Status: AC
  Administered 2022-08-30: 400 mg via ORAL

## 2022-08-30 MED ORDER — DULOXETINE HCL 60 MG PO CPEP
60.0000 mg | ORAL_CAPSULE | Freq: Every day | ORAL | Status: DC
  Administered 2022-08-30 – 2022-09-06 (×8): 60 mg via ORAL
  Filled 2022-08-30 (×8): qty 1

## 2022-08-30 MED ORDER — METOPROLOL TARTRATE 25 MG PO TABS
25.0000 mg | ORAL_TABLET | Freq: Two times a day (BID) | ORAL | Status: DC
  Administered 2022-08-31 – 2022-09-06 (×13): 25 mg via ORAL
  Filled 2022-08-30 (×13): qty 1

## 2022-08-30 MED ORDER — POLYETHYLENE GLYCOL 3350 17 G PO PACK: 17.0000 g | PACK | Freq: Every day | ORAL | Status: DC | PRN

## 2022-08-30 MED ORDER — UMECLIDINIUM-VILANTEROL 62.5-25 MCG/ACT IN AEPB
1.0000 | INHALATION_SPRAY | Freq: Every day | RESPIRATORY_TRACT | Status: DC
  Administered 2022-08-31 – 2022-09-06 (×5): 1 via RESPIRATORY_TRACT
  Filled 2022-08-30 (×2): qty 14

## 2022-08-30 MED ORDER — MELATONIN 3 MG PO TABS
3.0000 mg | ORAL_TABLET | Freq: Every day | ORAL | Status: DC
  Administered 2022-08-30 – 2022-09-05 (×7): 3 mg via ORAL
  Filled 2022-08-30 (×7): qty 1

## 2022-08-30 MED ORDER — PREDNISONE 20 MG PO TABS
30.0000 mg | ORAL_TABLET | Freq: Every day | ORAL | Status: DC
  Administered 2022-08-31: 30 mg via ORAL
  Filled 2022-08-30: qty 1

## 2022-08-30 MED ORDER — SIMVASTATIN 20 MG PO TABS: 40.0000 mg | ORAL_TABLET | Freq: Every day | ORAL | Status: DC

## 2022-08-30 MED ORDER — LOSARTAN POTASSIUM 50 MG PO TABS
100.0000 mg | ORAL_TABLET | Freq: Every day | ORAL | Status: DC
  Administered 2022-08-31 – 2022-09-06 (×7): 100 mg via ORAL
  Filled 2022-08-30 (×7): qty 2

## 2022-08-30 MED ORDER — ACETAMINOPHEN 650 MG RE SUPP: 650.0000 mg | Freq: Four times a day (QID) | RECTAL | Status: DC | PRN

## 2022-08-30 MED ORDER — AMLODIPINE BESYLATE 10 MG PO TABS
10.0000 mg | ORAL_TABLET | Freq: Every day | ORAL | Status: DC
  Administered 2022-08-31 – 2022-09-06 (×7): 10 mg via ORAL
  Filled 2022-08-30 (×7): qty 1

## 2022-08-30 MED ORDER — ENOXAPARIN SODIUM 40 MG/0.4ML IJ SOSY
40.0000 mg | PREFILLED_SYRINGE | INTRAMUSCULAR | Status: DC
  Administered 2022-08-30 – 2022-09-05 (×7): 40 mg via SUBCUTANEOUS
  Filled 2022-08-30 (×7): qty 0.4

## 2022-08-30 MED ORDER — PREGABALIN 100 MG PO CAPS
100.0000 mg | ORAL_CAPSULE | Freq: Three times a day (TID) | ORAL | Status: DC
  Administered 2022-08-30 – 2022-09-06 (×19): 100 mg via ORAL
  Filled 2022-08-30 (×20): qty 1

## 2022-08-30 MED ORDER — PREDNISONE 20 MG PO TABS: 40.0000 mg | ORAL_TABLET | Freq: Every day | ORAL | 0 refills | Status: DC

## 2022-08-30 MED ORDER — AMOXICILLIN-POT CLAVULANATE 500-125 MG PO TABS: 1.0000 | ORAL_TABLET | Freq: Three times a day (TID) | ORAL | 0 refills | Status: DC

## 2022-08-30 MED ORDER — ATORVASTATIN CALCIUM 10 MG PO TABS
20.0000 mg | ORAL_TABLET | Freq: Every day | ORAL | Status: DC
  Administered 2022-08-31 – 2022-09-05 (×6): 20 mg via ORAL
  Filled 2022-08-30 (×6): qty 2

## 2022-08-30 MED ORDER — PREDNISONE 20 MG PO TABS
40.0000 mg | ORAL_TABLET | Freq: Once | ORAL | Status: AC
  Administered 2022-08-30: 40 mg via ORAL

## 2022-08-30 MED ORDER — ALBUTEROL SULFATE (2.5 MG/3ML) 0.083% IN NEBU
2.5000 mg | INHALATION_SOLUTION | RESPIRATORY_TRACT | Status: DC | PRN
  Administered 2022-09-01 – 2022-09-02 (×2): 2.5 mg via RESPIRATORY_TRACT
  Filled 2022-08-30 (×2): qty 3

## 2022-08-30 MED ORDER — HYDROCHLOROTHIAZIDE 25 MG PO TABS
25.0000 mg | ORAL_TABLET | Freq: Every day | ORAL | Status: DC
  Administered 2022-08-31 – 2022-09-06 (×7): 25 mg via ORAL
  Filled 2022-08-30 (×7): qty 1

## 2022-08-30 NOTE — Progress Notes (Unsigned)
Report called to [place] on [address].  Patient was transferred from CT via wheelchair to [place] [facility].

## 2022-08-30 NOTE — Hospital Course (Addendum)
"  I feel like I look like Shrek" from nose swelling Area is very sore.  Started day of discharge Thinks that someone scratched the inside of her nose in her last hospital stay?  No fever or chills. Crying a lot. Cough, some drainage. Snot from where she had been crying. Some pressure/fullness on R cheek. Has been able to use her inhalers despite her symptoms. Has some wheezing, ambulatory dyspnea. Feels "just like she did when she left here" but has come a long way from when she was admitted.  Chest pain. Esp with cough that is lingering. Phlegm is trace green.  Chest is hurting when she coughs in center. Not really with deep breathing, just cough fits.no pain when palpating.   No changes in beds since discharge.  Lives in [place] with husband of 79 years. He is very helpful for her health. Still smokes, is working on a plan to quit with her husband. Drinks beer splits a 12 pack with her husband per week.   Is tearful now  Admitted 08/30/2022  Allergies: Ace inhibitors Pertinent Hx: COPD, HTN, T2DM, HLD, hx breast cancer  71 y.o. female p/w severe pain of R face/nose with swelling  *R facial cellulitis: CT maxillofacial showing soft tissue stranding in premaxillary soft tissues on R with extension into R nare consistent with cellulitis, with addl soft tissue likely phlegmon over premaxillary soft tissues. Given 1x augementiin in clinic prior to admission.  Consults: None  Meds: Augmentin, breathing therapies, amlodipine, duloxetine, HCTZ, losartan, metoprolol, pregabalin, simvastatin  VTE ppx: Lovenox  IVF: None Diet: Carb modified

## 2022-08-30 NOTE — Telephone Encounter (Signed)
Call from patient has had problems with nose since discharge.  Swollen now .  Has pain in eye and ears.  Has gotten worse.  States swelling makes her look like Shrek. Very uncomfortable.  Patient given 9:15 AM appointment for this am.

## 2022-08-30 NOTE — H&P (Signed)
Date: 08/30/2022               Patient Name:  [name] MRN: [id number]  DOB: [birth year] Age / Sex: 71 y.o., female   PCP: Nani Gasser, MD         Medical Service: Internal Medicine Teaching Service         Attending Physician: Dr. Evette Doffing, Mallie Mussel, *    First Contact: Linward Natal, MD      Pager: [phone]      Second Contact: Farrel Gordon, DO      Pager: [phone]           After Hours (After 5p/  First Contact Pager: [phone]  weekends / holidays): Second Contact Pager: [phone]   SUBJECTIVE   Chief Complaint: Nose swelling  History of Present Illness: [name] is a 71 yo F with past medical history of gold E COPD with recent admission for exacerbation, HTN, T2DM, HLD, breast cancer who was directly admitted from the clinic for facial pain and swelling.  Her pain and swelling started right after she was discharged from the previous admission.  She believes she might of gotten a scratch on her nose at some point.  She has had some discharge from both nostrils over the past few days, but worse on the right.  She describes swelling, induration and significant pain from the skin on her right nostril which and worsened over the past few days.  She has tried to clean out her nose using tissues, and other methods, but says that she still feels significantly congested.  She also feels facial pain and pressure over her maxillary sinuses bilaterally.  She endorses coughing productive of green sputum, wheezing, dyspnea on ambulation, some chest tightness which is worse with coughing fits.  She denies fevers, chills, abdominal pain, nausea, vomiting.  No medication changes since discharge.  She was taking a course of prednisone taper for previous COPD exacerbation.  She was able to continue using her inhalers as prescribed.  Lives in [place] with husband of 81 years. He is very helpful for her health. Still smokes, is working on a plan to quit with her  husband. Drinks beer splits a 12 pack with her husband per week.   Is tearful now  Meds:  Current Facility-Administered Medications for the 08/30/22 encounter [facility] Encounter)  Medication   ipratropium-albuterol (DUONEB) 0.5-2.5 (3) MG/3ML nebulizer solution 3 mL   Current Meds  Medication Sig   acetaminophen (TYLENOL) 325 MG tablet Take 2 tablets (650 mg total) by mouth every 6 (six) hours as needed for mild pain (or Fever >/= 101).   albuterol (PROVENTIL HFA) 108 (90 Base) MCG/ACT inhaler Inhale 1-2 puffs into the lungs every 6 (six) hours as needed for wheezing or shortness of breath.   amLODipine (NORVASC) 10 MG tablet Take 1 tablet (10 mg total) by mouth daily.   DULoxetine (CYMBALTA) 60 MG capsule Take 1 capsule (60 mg total) by mouth daily.   hydrochlorothiazide (HYDRODIURIL) 25 MG tablet Take 1 tablet (25 mg total) by mouth daily.   losartan (COZAAR) 100 MG tablet Take 1 tablet (100 mg total) by mouth daily.   metoprolol tartrate (LOPRESSOR) 25 MG tablet Take 1 tablet (25 mg total) by mouth 2 (two) times daily.   predniSONE (DELTASONE) 20 MG tablet Take 2 tablets (40 mg total) by mouth daily with breakfast for 5 days.   [START ON 08/31/2022] predniSONE (DELTASONE) 20 MG tablet Take 2 tablets (40 mg total)  by mouth daily with breakfast for 4 days.   pregabalin (LYRICA) 100 MG capsule Take 100 mg by mouth 3 (three) times daily.   simvastatin (ZOCOR) 40 MG tablet Take 1 tablet (40 mg total) by mouth every evening.   umeclidinium-vilanterol (ANORO ELLIPTA) 62.5-25 MCG/ACT AEPB Inhale 1 puff into the lungs daily for 14 days.    Past Medical History  Past Surgical History:  Procedure Laterality Date   ABDOMINAL HYSTERECTOMY     ANTERIOR CERVICAL DECOMP/DISCECTOMY FUSION  2012   "Dr. 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  22.5-pack-year smoking history.  Drinks about a 12 pack a week.  Denies any other substance use.  Family History:  Family History  Problem Relation Age of Onset   Cancer Mother    Breast cancer Daughter 65     Allergies: Allergies as of 08/30/2022 - Review Complete 08/30/2022  Allergen Reaction Noted   Ace inhibitors Swelling 12/26/2014    Review of Systems: A complete ROS was negative except as per HPI.   OBJECTIVE:   Physical Exam: BP 134/72, P92, temp 99.1, RR 18, sats 98% on room air.  Constitutional: NAD HEENT: Bilateral nasal turbinates inflamed-worse on right compared to left, erythematous.  No visible debris.  No discharge bilaterally. Area of tender, indurated tissue at corner of right nare.  Tenderness to palpation of bilateral maxillary sinuses.  Cardiovascular: RRR, no murmurs, rubs or gallops Pulmonary/Chest: normal work of breathing on room air, lungs clear to auscultation bilaterally.  Occasional productive cough. Abdominal: soft, non-tender, non-distended Extremities: warm, well perfused, extremity pulses 2+, no BLE pitting edema  Labs:    Latest Ref Rng & Units 08/30/2022    4:42 PM 08/20/2022    4:02 AM 08/19/2022    3:58 AM 08/18/2022    3:12 AM 08/17/2022    2:48 AM 08/16/2022   12:10 AM 08/15/2022    7:00 AM  CBC EXTENDED  WBC 4.0 - 10.5 K/uL 15.5  15.7  12.9  11.1  9.0  6.1  3.0   RBC 3.87 - 5.11 MIL/uL 3.63  4.24  4.24  3.96  4.18  3.91  4.05   Hemoglobin 12.0 - 15.0 g/dL 11.7  13.6  13.3  12.6  13.4  12.4  13.0   HCT 36.0 - 46.0 % 33.2  38.6  39.1  36.0  38.0  36.5  37.2   Platelets 150 - 400 K/uL 356  326  323  300  309  263  295       Latest Ref Rng & Units 08/24/2022    3:59 AM 08/23/2022    5:14 AM 08/22/2022    8:17 AM  08/20/2022    4:02 AM 08/19/2022    3:58 AM 08/18/2022    3:12 AM 08/17/2022    2:48 AM  CMP  Glucose 70 - 99 mg/dL 269  198  199  184  170  114  103   BUN 8 - 23 mg/dL '14  15  15  11  10  9  9   '$ Creatinine 0.44 - 1.00 mg/dL 0.80  0.68  0.93  0.76  0.75  0.72  0.71   Sodium 135 - 145 mmol/L 129  130  129  133  131  135  136   Potassium 3.5 - 5.1 mmol/L 4.3  3.9  3.9  4.1  4.9  3.3  3.4   Chloride 98 - 111 mmol/L 88  93  91  97  95  97  98   CO2 22 - 32 mmol/L '29  27  25  25  27  28  29   '$ Calcium 8.9 - 10.3 mg/dL 8.6  8.6  8.7  8.8  9.2  8.8  8.8     Imaging: CT maxillofacial with contrast: IMPRESSION: 1. Soft tissue stranding in the premaxillary soft tissues on the right with extension along the right nare, consistent with cellulitis. More discrete region of soft tissue measuring approximately 1.0 x 1.0 cm in the premaxillary soft tissues likely represents phlegmonous changes and correlates with the palpable abnormality on clinical exam. 2. Air-fluid levels in bilateral maxillary sinuses, concerning for acute sinusitis. 3. Nonspecific 6 mm x 6 mm lesion in the genu of the corpus callosum. Recommend MRI of the brain with and without contrast for further evaluation.  EKG:   ASSESSMENT & PLAN:  [name] is a 71 y.o. female with PMH COPD, HTN, T2DM, HLD, breast cancer who presented with facial swelling and is admitted for right nose cellulitis and bilateral maxillary sinusitis on hospital day 0  #Right no cellulitis #Bilateral maxillary sinusitis Patient presents with approximately 5 days of worsening pain, swelling, induration of her right nose with associated possibly purulent discharge and tenderness over her bilateral maxillary sinuses.  On exam, she has patch of tender, indurated skin at the corner of her right nare as well as bilateral maxillary tenderness.  She has leukocytosis at 15.5 but this was also present at the end of her previous admission and she has been  on steroids.  CT maxillofacial scan with contrast shows evidence of cellulitis in the area specified on exam as well as bilateral sinusitis in the maxillary sinuses. - Continue Augmentin for 10 days total --tylenol 650 q6 PRN for pain, can expand if necessary -- Trend CBC  #COPD Gold D COPD with recent admission, discharged on 10/17.  Was on IV Solu-Medrol for 6 days during the previous admission.  Discharged on Anoro Ellipta, Ventolin, albuterol, 5-day course of prednisone.  Is coughing on exam is productive of sputum, but does not have significant wheezing and is oxygenating well on room air.  Presentation is not consistent with COPD exacerbation at this time. - Anoro Ellipta daily, albuterol nebs every 4 as needed - Start tapering prednisone.  Starting with 30 mg tomorrow - Mucinex twice daily as needed for cough  #HTN On amlodipine 10, HCTZ 25, metoprolol tartrate 25 twice daily at home.  We will continue these tomorrow  #T2DM A1c 5.7 back in July. -Continue Lyrica 100 mg 3 times daily, Cymbalta 60 mg daily - A1c due tomorrow - CBGs ACHS given her long course of steroids  #HLD Continue Zocor 40 mg daily  Diet: Carb-Modified VTE: Enoxaparin IVF: None,None Code: Full  Prior to Admission Living Arrangement: Home, living with husband Anticipated Discharge Location: Home Barriers to Discharge: medical management and improvement of cellulitis  Dispo: Admit patient to Observation with expected length of stay less than 2 midnights.  Signed: Linus Galas, MD Internal Medicine Resident PGY-1  08/30/2022, 5:40 PM

## 2022-08-30 NOTE — Progress Notes (Signed)
Consult received for PIV placement. However, there is no order for PIV, infusions or Tele or other tests requiring PIV placement. Secure chat sent, notified RN to re-consult ones orders are placed or patient status changes.

## 2022-08-30 NOTE — Progress Notes (Unsigned)
Internal Medicine Clinic Attending  I saw and evaluated the patient.  I personally confirmed the key portions of the history and exam documented by Dr. Collene Gobble and I reviewed pertinent patient test results.  The assessment, diagnosis, and plan were formulated together and I agree with the documentation in the resident's note.   Patient here with worsening facial pain and swelling since recent hospital discharge for a COPD exacerbation. On exam, she has swelling and fluctuance over her right maxillary sinus as well as small abscess with induration over her right nare. Diffusing wheezing with cough, but oxygenating well on RA. She is very tearful and scared. We discussed admission for expedited work up with CT, possible surgical consultation pending results. We have given a dose of oral Augmentin and prednisone for ongoing COPD exacerbation.

## 2022-08-30 NOTE — Patient Instructions (Addendum)
[name], it was a pleasure seeing you today!  Today we discussed: - Current illness: I believe you are having a sinus infection with a mild COPD exacerbation.    -For your sinus infection, I have prescribed 5 days of antibiotics (amoxicillin-clauvanate). You should take these three times daily. I have also prescribed a short course of naproxen to help with the facial pain.   -For your COPD exacerbation, I have prescribed prednisone. You received a dose of prednisone in clinic this morning. Therefore, you should next take prednisone TOMORROW morning, October 24th. You will take four days of prednisone at home. Also continue taking your albuterol nebulizer.   - If you do not see improvement in your symptoms by the end of the week, please return to clinic.   I have ordered the following medication/changed the following medications:   Start the following medications: Meds ordered this encounter  Medications   ipratropium-albuterol (DUONEB) 0.5-2.5 (3) MG/3ML nebulizer solution 3 mL   predniSONE (DELTASONE) tablet 40 mg   amoxicillin-clavulanate (AUGMENTIN) 500-125 MG tablet    Sig: Take 1 tablet by mouth 3 (three) times daily for 5 days.    Dispense:  15 tablet    Refill:  0   naproxen (NAPROSYN) 500 MG tablet    Sig: Take 1 tablet (500 mg total) by mouth 2 (two) times daily with a meal for 7 days.    Dispense:  14 tablet    Refill:  0   predniSONE (DELTASONE) 20 MG tablet    Sig: Take 2 tablets (40 mg total) by mouth daily with breakfast for 4 days.    Dispense:  8 tablet    Refill:  0     Follow-up:  if symptoms do not improve    Please make sure to arrive 15 minutes prior to your next appointment. If you arrive late, you may be asked to reschedule.   We look forward to seeing you next time. Please call our clinic at [phone] if you have any questions or concerns. The best time to call is Monday-Friday from 9am-4pm, but there is someone available 24/7. If after  hours or the weekend, call the main hospital number and ask for the Internal Medicine Resident On-Call. If you need medication refills, please notify your pharmacy one week in advance and they will send [place] a request.  Thank you for letting us take part in your care. Wishing you the best!  Thank you, Sanjuan Dame, MD

## 2022-08-31 ENCOUNTER — Encounter (HOSPITAL_COMMUNITY): Payer: Self-pay | Admitting: Student in an Organized Health Care Education/Training Program

## 2022-08-31 DIAGNOSIS — I10 Essential (primary) hypertension: Secondary | ICD-10-CM | POA: Diagnosis present

## 2022-08-31 DIAGNOSIS — Z794 Long term (current) use of insulin: Secondary | ICD-10-CM | POA: Diagnosis not present

## 2022-08-31 DIAGNOSIS — Z923 Personal history of irradiation: Secondary | ICD-10-CM | POA: Diagnosis not present

## 2022-08-31 DIAGNOSIS — E785 Hyperlipidemia, unspecified: Secondary | ICD-10-CM | POA: Diagnosis present

## 2022-08-31 DIAGNOSIS — J449 Chronic obstructive pulmonary disease, unspecified: Secondary | ICD-10-CM | POA: Diagnosis not present

## 2022-08-31 DIAGNOSIS — J4489 Other specified chronic obstructive pulmonary disease: Secondary | ICD-10-CM | POA: Diagnosis present

## 2022-08-31 DIAGNOSIS — G9389 Other specified disorders of brain: Secondary | ICD-10-CM | POA: Diagnosis not present

## 2022-08-31 DIAGNOSIS — F17210 Nicotine dependence, cigarettes, uncomplicated: Secondary | ICD-10-CM | POA: Diagnosis present

## 2022-08-31 DIAGNOSIS — F17200 Nicotine dependence, unspecified, uncomplicated: Secondary | ICD-10-CM | POA: Diagnosis not present

## 2022-08-31 DIAGNOSIS — R600 Localized edema: Secondary | ICD-10-CM

## 2022-08-31 DIAGNOSIS — J340 Abscess, furuncle and carbuncle of nose: Secondary | ICD-10-CM | POA: Diagnosis not present

## 2022-08-31 DIAGNOSIS — Z9221 Personal history of antineoplastic chemotherapy: Secondary | ICD-10-CM | POA: Diagnosis not present

## 2022-08-31 DIAGNOSIS — J320 Chronic maxillary sinusitis: Secondary | ICD-10-CM

## 2022-08-31 DIAGNOSIS — Z853 Personal history of malignant neoplasm of breast: Secondary | ICD-10-CM | POA: Diagnosis not present

## 2022-08-31 DIAGNOSIS — E119 Type 2 diabetes mellitus without complications: Secondary | ICD-10-CM | POA: Diagnosis present

## 2022-08-31 DIAGNOSIS — L03211 Cellulitis of face: Secondary | ICD-10-CM | POA: Diagnosis present

## 2022-08-31 DIAGNOSIS — J3489 Other specified disorders of nose and nasal sinuses: Secondary | ICD-10-CM | POA: Diagnosis present

## 2022-08-31 DIAGNOSIS — L0201 Cutaneous abscess of face: Secondary | ICD-10-CM | POA: Diagnosis present

## 2022-08-31 DIAGNOSIS — M1990 Unspecified osteoarthritis, unspecified site: Secondary | ICD-10-CM | POA: Diagnosis present

## 2022-08-31 DIAGNOSIS — E669 Obesity, unspecified: Secondary | ICD-10-CM | POA: Diagnosis present

## 2022-08-31 DIAGNOSIS — Z79899 Other long term (current) drug therapy: Secondary | ICD-10-CM | POA: Diagnosis not present

## 2022-08-31 DIAGNOSIS — Z6828 Body mass index (BMI) 28.0-28.9, adult: Secondary | ICD-10-CM | POA: Diagnosis not present

## 2022-08-31 HISTORY — DX: Localized edema: R60.0

## 2022-08-31 LAB — BASIC METABOLIC PANEL
Anion gap: 10 (ref 5–15)
BUN: 9 mg/dL (ref 8–23)
CO2: 27 mmol/L (ref 22–32)
Calcium: 8.9 mg/dL (ref 8.9–10.3)
Chloride: 98 mmol/L (ref 98–111)
Creatinine, Ser: 0.92 mg/dL (ref 0.44–1.00)
GFR, Estimated: >60 mL/min (ref >60)
Glucose, Bld: 141 mg/dL — ABNORMAL HIGH (ref 70–99)
Potassium: 4.5 mmol/L (ref 3.5–5.1)
Sodium: 135 mmol/L (ref 135–145)

## 2022-08-31 LAB — GLUCOSE, CAPILLARY
Glucose-Capillary: 132 mg/dL — ABNORMAL HIGH (ref 70–99)
Glucose-Capillary: 261 mg/dL — ABNORMAL HIGH (ref 70–99)
Glucose-Capillary: 259 mg/dL — ABNORMAL HIGH (ref 70–99)
Glucose-Capillary: 171 mg/dL — ABNORMAL HIGH (ref 70–99)

## 2022-08-31 LAB — CBC
HCT: 37.4 % (ref 36.0–46.0)
Hemoglobin: 12.4 g/dL (ref 12.0–15.0)
MCH: 31 pg (ref 26.0–34.0)
MCHC: 33.2 g/dL (ref 30.0–36.0)
MCV: 93.5 fL (ref 80.0–100.0)
Platelets: 380 10*3/uL (ref 150–400)
RBC: 4 MIL/uL (ref 3.87–5.11)
RDW: 12.6 % (ref 11.5–15.5)
WBC: 13.2 10*3/uL — ABNORMAL HIGH (ref 4.0–10.5)
nRBC: 0 % (ref 0.0–0.2)

## 2022-08-31 LAB — HEMOGLOBIN A1C
Hgb A1c MFr Bld: 6.5 % — ABNORMAL HIGH (ref 4.8–5.6)
Mean Plasma Glucose: 139.85 mg/dL

## 2022-08-31 MED ORDER — INSULIN ASPART 100 UNIT/ML IJ SOLN: 0.0000 [IU] | Freq: Three times a day (TID) | INTRAMUSCULAR | Status: DC

## 2022-08-31 MED ORDER — INSULIN ASPART 100 UNIT/ML IJ SOLN
0.0000 [IU] | Freq: Three times a day (TID) | INTRAMUSCULAR | Status: DC
  Administered 2022-08-31: 5 [IU] via SUBCUTANEOUS
  Administered 2022-09-01: 1 [IU] via SUBCUTANEOUS
  Administered 2022-09-01: 2 [IU] via SUBCUTANEOUS
  Administered 2022-09-01: 3 [IU] via SUBCUTANEOUS
  Administered 2022-09-02: 7 [IU] via SUBCUTANEOUS
  Administered 2022-09-02: 1 [IU] via SUBCUTANEOUS
  Administered 2022-09-02: 3 [IU] via SUBCUTANEOUS
  Administered 2022-09-03 (×2): 2 [IU] via SUBCUTANEOUS
  Administered 2022-09-03 – 2022-09-04 (×3): 5 [IU] via SUBCUTANEOUS
  Administered 2022-09-05: 1 [IU] via SUBCUTANEOUS
  Administered 2022-09-05 – 2022-09-06 (×3): 3 [IU] via SUBCUTANEOUS

## 2022-08-31 MED ORDER — SODIUM CHLORIDE 0.9 % IV SOLN
3.0000 g | Freq: Three times a day (TID) | INTRAVENOUS | Status: DC
  Administered 2022-08-31 – 2022-09-05 (×15): 3 g via INTRAVENOUS
  Filled 2022-08-31 (×15): qty 8

## 2022-08-31 NOTE — Progress Notes (Signed)
Mobility Specialist - Progress Note   08/31/22 1452  Mobility  Activity Ambulated with assistance in hallway  Level of Assistance Standby assist, set-up cues, supervision of patient - no hands on  Assistive Device None  Distance Ambulated (ft) 300 ft  Activity Response Tolerated well  $Mobility charge 1 Mobility    Pt received in bed agreeable to hallway ambulation. Left in bed w/ call bell in reach and all needs met.   Paulla Dolly Mobility Specialist

## 2022-08-31 NOTE — Progress Notes (Addendum)
HD#0 Subjective:   Summary: [name] is a 71 yo F with past medical history of gold E COPD with recent admission for exacerbation, HTN, T2DM, HLD, breast cancer who was directly admitted from the clinic for facial pain and swelling.   Overnight Events: No acute events overnight  Patient reports that she had an intranasal medication on the day of discharge during her last admission and the nurse jabbed her nose accidentally.  Patient states she had symptoms from then on.  She also endorses continued maxillary sinus pain.  She states that she feels like she looks like Shrek.  She denies any difficulty breathing today.  Objective:  Vital signs in last 24 hours: Vitals:   08/30/22 2212 08/31/22 0053 08/31/22 0328  BP: (!) 169/82  (!) 162/72  Pulse: 77  95  Resp: 18  18  Temp: 97.7 F (36.5 C)  98.1 F (36.7 C)  TempSrc: Oral  Oral  SpO2: 100%  96%  Weight:  79 kg   Height:  '5\' 6"'$  (1.676 m)    Supplemental O2: Room Air SpO2: 96 %   Physical Exam:  Constitutional: well-appearing elderly female sitting in hospital bed, in no acute distress HENT: Skin lateral to right nare with tender, indurated area with erythema and scaling.  No purulent discharge. Eyes: conjunctiva non-erythematous Neck: supple Pulmonary/Chest: normal work of breathing on room air MSK: normal bulk and tone Neurological: alert & oriented x 3 Psych: Normal mood and affect  Filed Weights   08/31/22 0053  Weight: 79 kg    No intake or output data in the 24 hours ending 08/31/22 1603 Net IO Since Admission: No IO data has been entered for this period [08/31/22 1603]  Pertinent Labs:    Latest Ref Rng & Units 08/31/2022    3:21 AM 08/30/2022    4:42 PM 08/20/2022    4:02 AM  CBC  WBC 4.0 - 10.5 K/uL 13.2  15.5  15.7   Hemoglobin 12.0 - 15.0 g/dL 12.4  11.7  13.6   Hematocrit 36.0 - 46.0 % 37.4  33.2  38.6   Platelets 150 - 400 K/uL 380  356  326        Latest Ref Rng & Units 08/31/2022     3:21 AM 08/24/2022    3:59 AM 08/23/2022    5:14 AM  CMP  Glucose 70 - 99 mg/dL 141  269  198   BUN 8 - 23 mg/dL '9  14  15   '$ Creatinine 0.44 - 1.00 mg/dL 0.92  0.80  0.68   Sodium 135 - 145 mmol/L 135  129  130   Potassium 3.5 - 5.1 mmol/L 4.5  4.3  3.9   Chloride 98 - 111 mmol/L 98  88  93   CO2 22 - 32 mmol/L '27  29  27   '$ Calcium 8.9 - 10.3 mg/dL 8.9  8.6  8.6     Imaging: No results found.  Assessment/Plan:   Principal Problem:   Facial cellulitis Active Problems:   Chronic obstructive pulmonary disease with bronchospasm (HCC)   Chronic sinusitis   Type 2 diabetes mellitus with complication, without long-term current use of insulin (HCC)   Anxiety   Facial edema   Patient Summary: [name] is a 71 y.o. female with PMH COPD, HTN, T2DM, HLD, breast cancer who presented with facial swelling and is admitted for right nose cellulitis and bilateral maxillary sinusitis on hospital day 0   #Facial  cellulitis #Bilateral maxillary sinusitis Patient with cellulitis at right nose otherwise clinically stable.  Leukocytosis improving.  Afebrile.  Will provide IV Unasyn given concurrent sinusitis.  Will monitor for clinical response. - IV Unasyn --tylenol 650 q6 PRN for pain -- Trend CBC   #COPD Clinically stable.  No concerns for exacerbation at this time. - Anoro Ellipta daily, albuterol nebs every 4 as needed - Start tapering prednisone.  Starting with 30 mg tomorrow - Mucinex twice daily as needed for cough   #HTN Patient mildly hypertensive today.  She is now on her home regimen.  We will continue to monitor but expect to improve as we address other issues. -amlodipine 10, HCTZ 25, metoprolol tartrate 25 twice daily per home regimen.    #T2DM A1c 5.7 back in July.  Repeat 6.5.  She has been on steroids for a couple of weeks now.  Fasting glucose 132 today. -We will add SSI -Continue Lyrica 100 mg 3 times daily, Cymbalta 60 mg daily - CBGs ACHS given her long  course of steroids   #HLD Continue Zocor 40 mg daily  Diet: Carb-Modified VTE: Enoxaparin IVF: None,None Code: Full   Prior to Admission Living Arrangement: Home, living with husband Anticipated Discharge Location: Home Barriers to Discharge: medical management and improvement of cellulitis   Dispo: Admit patient to Observation with expected length of stay less than 2 midnights.  Linward Natal MD Internal Medicine Resident PGY-1 Please contact the on call pager after 5 pm and on weekends at [phone].

## 2022-08-31 NOTE — Progress Notes (Signed)
Pharmacy Antibiotic Note  [name] is a 71 y.o. female admitted on 08/30/2022 with  wound infection .  Pharmacy has been consulted for Unasyn dosing. CrCl - 60 ml/min  Plan: Unasyn 3 gm IV q8hr Monitor renal function and clinical status  Height: '5\' 6"'$  (167.6 cm) Weight: 79 kg (174 lb 3.2 oz) IBW/kg (Calculated) : 59.3  Temp (24hrs), Avg:98.5 F (36.9 C), Min:97.7 F (36.5 C), Max:99.1 F (37.3 C)  Recent Labs  Lab 08/30/22 1642 08/31/22 0321  WBC 15.5* 13.2*  CREATININE  --  0.92    Estimated Creatinine Clearance: 59.5 mL/min (by C-G formula based on SCr of 0.92 mg/dL).    Allergies  Allergen Reactions   Ace Inhibitors Swelling    Throat swelling. Lisinopril-HCTZ Pt states she in not allergic     Antimicrobials this admission: Unasyn 10/24 >>   Thank you for allowing pharmacy to be a part of this patient's care.  Alanda Slim, PharmD, [facility] Clinical Pharmacist Please see AMION for all Pharmacists' Contact Phone Numbers 08/31/2022, 1:30 PM

## 2022-09-01 DIAGNOSIS — J320 Chronic maxillary sinusitis: Secondary | ICD-10-CM | POA: Diagnosis not present

## 2022-09-01 DIAGNOSIS — L03211 Cellulitis of face: Secondary | ICD-10-CM | POA: Diagnosis not present

## 2022-09-01 LAB — GLUCOSE, CAPILLARY
Glucose-Capillary: 203 mg/dL — ABNORMAL HIGH (ref 70–99)
Glucose-Capillary: 138 mg/dL — ABNORMAL HIGH (ref 70–99)
Glucose-Capillary: 224 mg/dL — ABNORMAL HIGH (ref 70–99)

## 2022-09-01 LAB — CBC
HCT: 34 % — ABNORMAL LOW (ref 36.0–46.0)
Hemoglobin: 11.2 g/dL — ABNORMAL LOW (ref 12.0–15.0)
MCH: 31.2 pg (ref 26.0–34.0)
MCHC: 32.9 g/dL (ref 30.0–36.0)
MCV: 94.7 fL (ref 80.0–100.0)
Platelets: 328 10*3/uL (ref 150–400)
RBC: 3.59 MIL/uL — ABNORMAL LOW (ref 3.87–5.11)
RDW: 12.5 % (ref 11.5–15.5)
WBC: 14.3 10*3/uL — ABNORMAL HIGH (ref 4.0–10.5)
nRBC: 0 % (ref 0.0–0.2)

## 2022-09-01 MED ORDER — PSEUDOEPHEDRINE HCL 30 MG PO TABS: 30.0000 mg | ORAL_TABLET | Freq: Four times a day (QID) | ORAL | Status: DC | PRN

## 2022-09-01 MED ORDER — IPRATROPIUM-ALBUTEROL 0.5-2.5 (3) MG/3ML IN SOLN
3.0000 mL | RESPIRATORY_TRACT | Status: DC
  Administered 2022-09-01: 3 mL via RESPIRATORY_TRACT
  Filled 2022-09-01: qty 3

## 2022-09-01 MED ORDER — IPRATROPIUM-ALBUTEROL 0.5-2.5 (3) MG/3ML IN SOLN
3.0000 mL | Freq: Two times a day (BID) | RESPIRATORY_TRACT | Status: DC
  Administered 2022-09-01 – 2022-09-03 (×3): 3 mL via RESPIRATORY_TRACT
  Filled 2022-09-01 (×4): qty 3

## 2022-09-01 MED ORDER — PREDNISONE 20 MG PO TABS
20.0000 mg | ORAL_TABLET | Freq: Every day | ORAL | Status: DC
  Administered 2022-09-02 – 2022-09-03 (×2): 20 mg via ORAL
  Filled 2022-09-01 (×3): qty 1

## 2022-09-01 MED ORDER — PSEUDOEPHEDRINE HCL 60 MG PO TABS: 60.0000 mg | ORAL_TABLET | Freq: Four times a day (QID) | ORAL | Status: DC | PRN

## 2022-09-01 NOTE — Assessment & Plan Note (Signed)
[name] is presenting to [facility] with right sided facial pain since discharge from her recent COPD exacerbation. She reports continued right sided facial pain, mainly to the right of her nose and under her right eye. She states the pain has been gradually increasing without relief despite the medicines she was given in the hospital. She denies any fevers, chills, body aches, vision changes, pain with eye movement, numbness, paresthesias. Overall, she states the pain is the most bothersome and she feels like she needs for it to be suctioned out.   Upon exam, there is swelling with an indurated area over her right maxillary sinus, also appears to be some superficial fluctuance over right nare. She is currently hemodynamically stable, oxygenating well, and protecting her airway. However, given this possible facial abscess, we will be expeditious to get imaging. Plan to obtain CT maxillary face and admit to IMTS.

## 2022-09-01 NOTE — Progress Notes (Signed)
   09/01/22 1956  Hygiene  Hygiene Other (Comment) (PT refused bath)

## 2022-09-01 NOTE — Progress Notes (Signed)
Mobility Specialist - Progress Note   09/01/22 1504  Mobility  Activity Ambulated with assistance in hallway  Level of Assistance Standby assist, set-up cues, supervision of patient - no hands on  Assistive Device None  Distance Ambulated (ft) 200 ft  Activity Response Tolerated well  $Mobility charge 1 Mobility    Pt received in bed agreeable to hallway ambulation. Left in bed w/ call bell in reach and all needs met.   Paulla Dolly Mobility Specialist

## 2022-09-01 NOTE — Progress Notes (Signed)
HD#1 Subjective:   Summary: [name] is a 71 yo F with past medical history of gold E COPD with recent admission for exacerbation, HTN, T2DM, HLD, breast cancer who was directly admitted from the clinic for facial pain and swelling.   Overnight Events: No acute events overnight  Patient continues to endorse discomfort at her right nose and cheek. She feels like her right nostril is stopped up. She is frustrated because she does not remember her antibiotic being administered but we discussed that they have. She states she had trouble sleeping last night.  Objective:  Vital signs in last 24 hours: Vitals:   08/31/22 0053 08/31/22 0328 08/31/22 2113 09/01/22 0457  BP:  (!) 162/72 123/60 (!) 141/93  Pulse:  95 78 74  Resp:  '18 17 17  '$ Temp:  98.1 F (36.7 C) 97.8 F (36.6 C) 98 F (36.7 C)  TempSrc:  Oral Oral Oral  SpO2:  96% 100% 100%  Weight: 79 kg     Height: '5\' 6"'$  (1.676 m)      Supplemental O2: Room Air SpO2: 100 %   Physical Exam:  Constitutional: well-appearing elderly female sitting in hospital bed, in no acute distress HENT: Skin lateral to right nare with tender, indurated area with erythema and scaling that is improving.  No purulent discharge. Pulmonary/Chest: normal work of breathing on room air, no wheezing on auscultation though exam limited due to coughing spell Neurological: alert & oriented x 3 Psych: Normal mood and affect  Filed Weights   08/31/22 0053  Weight: 79 kg     Intake/Output Summary (Last 24 hours) at 09/01/2022 1437 Last data filed at 09/01/2022 1218 Gross per 24 hour  Intake 1160 ml  Output --  Net 1160 ml   Net IO Since Admission: 1,160 mL [09/01/22 1437]  Pertinent Labs:    Latest Ref Rng & Units 09/01/2022    5:33 AM 08/31/2022    3:21 AM 08/30/2022    4:42 PM  CBC  WBC 4.0 - 10.5 K/uL 14.3  13.2  15.5   Hemoglobin 12.0 - 15.0 g/dL 11.2  12.4  11.7   Hematocrit 36.0 - 46.0 % 34.0  37.4  33.2   Platelets 150 - 400  K/uL 328  380  356        Latest Ref Rng & Units 08/31/2022    3:21 AM 08/24/2022    3:59 AM 08/23/2022    5:14 AM  CMP  Glucose 70 - 99 mg/dL 141  269  198   BUN 8 - 23 mg/dL '9  14  15   '$ Creatinine 0.44 - 1.00 mg/dL 0.92  0.80  0.68   Sodium 135 - 145 mmol/L 135  129  130   Potassium 3.5 - 5.1 mmol/L 4.5  4.3  3.9   Chloride 98 - 111 mmol/L 98  88  93   CO2 22 - 32 mmol/L '27  29  27   '$ Calcium 8.9 - 10.3 mg/dL 8.9  8.6  8.6     Imaging: No results found.  Assessment/Plan:   Principal Problem:   Facial cellulitis Active Problems:   Chronic obstructive pulmonary disease with bronchospasm (HCC)   Chronic sinusitis   Type 2 diabetes mellitus with complication, without long-term current use of insulin (HCC)   Anxiety   Facial edema   Patient Summary: [name] is a 71 y.o. female with PMH COPD, HTN, T2DM, HLD, breast cancer who presented with facial swelling and  is admitted for right nose cellulitis and bilateral maxillary sinusitis.   #Facial cellulitis #Bilateral maxillary sinusitis Patient with cellulitis at right nose otherwise clinically stable.  Still with mild leukocytosis, though patient is on prednisone.  Afebrile.  Will provide IV Unasyn given concurrent sinusitis.  Will monitor for clinical response and convert to oral antibiotics when able.  Cellulitis mildly improved on exam today. - IV Unasyn --tylenol 650 q6 PRN for pain - Trend CBC   #COPD Clinically stable.  Still with coughing spells but no significant wheezing on exam today.  No concerns for exacerbation at this time. - Anoro Ellipta daily, albuterol nebs every 4 as needed - Tapering prednisone to 20 mg today - Mucinex twice daily as needed for cough   #HTN Patient continues to be mildly hypertensive.  She is now on her home regimen.  We will continue to monitor but expect to improve as we address other issues. -amlodipine 10 -HCTZ 25 -metoprolol tartrate 25 twice daily   #T2DM A1c 5.7  in July.  Repeat A1c 6.5 this admission.  She has been on steroids for a couple of weeks now.  She does not have a fasting glucose today, postprandial 203.  We will continue sliding scale for now and add basal insulin tomorrow if necessary. -We will add SSI -Continue Lyrica 100 mg 3 times daily, Cymbalta 60 mg daily - CBGs ACHS given her long course of steroids   #HLD Continue Zocor 40 mg daily  Diet: Carb-Modified VTE: Enoxaparin IVF: None,None Code: Full   Prior to Admission Living Arrangement: Home, living with husband Anticipated Discharge Location: Home Barriers to Discharge: medical management and improvement of cellulitis   Dispo: Admit patient to Observation with expected length of stay less than 2 midnights.  Linward Natal MD Internal Medicine Resident PGY-1 Please contact the on call pager after 5 pm and on weekends at [phone].

## 2022-09-01 NOTE — Progress Notes (Signed)
Patient doing okay today, pain managed with tylenol. She did have concerns with her nose closing up. Became anxious and asked for the doctor a couple times today. I was able to talk to her and get her calmed down, doctor came by and spoke to her as well. No other issues today. Breathing treatment asked for x2 today.

## 2022-09-01 NOTE — Assessment & Plan Note (Signed)
Patient was recently hospitalized for COPD exacerbation. Since she was discharged, she reports her breathing has slowly worsened. Unfortunately, due to insurance issues, she has been unable to obtain all of the necessary medications for her COPD. Has been complaint with Anoro and albuterol, but these have not helped much. Continues to have significant productive cough and dyspnea.   Upon examination, patient does have diffuse wheezing and is coughing frequently. Thankfully, she is hemodynamically stable and does not appear septic. She is also oxygenating well on room air.  From a COPD exacerbation perspective, I think her symptoms are mild, however given her possible facial abscess we will admit to IMTS for further work-up.

## 2022-09-01 NOTE — Progress Notes (Signed)
CC: facial pain, cough  HPI:  [name] is a 71 y.o. person with history as below presenting to [facility] for facial pain and cough.  Please see problem-based list for further details, assessments, and plans.  Past Medical History:  Diagnosis Date   Acute sinusitis 01/09/2019   Anxiety    Arthritis    "fingers, knees" (08/16/2018)   Asthma    Atopic dermatitis 03/20/2018   Axillary hidradenitis suppurativa 10/13/2018   Cancer of right breast ([place]) 1991   s/p lumpectomy, chemotherapy and radiation therapy in 1991. Mammogram in 2007 was normal.   Constipated    h/o   COPD (chronic obstructive pulmonary disease) (HCC)    History of multiple hospital admissions for exercabation    COPD exacerbation ([place]) 01/01/2019   COPD with acute exacerbation ([place]) 01/14/2019   COPD with exacerbation ([place]) 04/06/2009   Qualifier: Diagnosis of  By: Eyvonne Mechanic MD, Vijay     Depression    Diarrhea    h/o   GERD (gastroesophageal reflux disease)    Headache    "a few times/month" (08/16/2018   Heart murmur 10/05/11   "first time I ever heard I had one was today"   History of breast cancer 12/01/2012   Pt with h/o breast CA s/p lumpectomy with chemo/radiation in 1991. Pt mammogram 2011 was unremarkable. Had CT chest 10/2012 in ED for SOB and showed spiculated nodule with lymph node. 12/04/12: Birads 2; repeat diagnostic mammogram in 1 year.     Hyperlipidemia    Hypertension    Lower extremity edema 09/21/2018   Obesity    Personal history of chemotherapy    Personal history of radiation therapy    Pneumonia    "couple times in the last 10-15 yrs" (08/16/2018)   QT prolongation 08/08/2014   Seasonal allergies 02/25/2017   Shortness of breath 10/05/11   "at rest; lying down; w/exertion"   Sigmoid diverticulitis 80/2008   Tobacco abuse    Type 2 diabetes mellitus ([place]) 05/14/2009   Type II diabetes mellitus ([place])    Review of Systems:  As per HPI  Physical Exam:  Vitals:   08/30/22 0939  BP:  (!) 145/74  Pulse: (!) 56  Temp: 98.6 F (37 C)  TempSrc: Oral  SpO2: 98%  Weight: 174 lb 3.2 oz (79 kg)  Height: '5\' 6"'$  (1.676 m)   General: Tearful, appears uncomfortable, sitting in chair HENT: Swelling with deep indurated area over right maxillary sinus area. Small superficial fluctuance over right nare.  Eyes: No conjunctival injection bilaterally. Vision in tact. CV: Regular rhythm, rate. No murmurs appreciated. Pulm: Wheezing present throughout the lung fields, frequent cough with deep breaths Neuro: Awake, alert, conversing appropriately. Grossly non-focal  Assessment & Plan:   Facial cellulitis [name] is presenting to [facility] with right sided facial pain since discharge from her recent COPD exacerbation. She reports continued right sided facial pain, mainly to the right of her nose and under her right eye. She states the pain has been gradually increasing without relief despite the medicines she was given in the hospital. She denies any fevers, chills, body aches, vision changes, pain with eye movement, numbness, paresthesias. Overall, she states the pain is the most bothersome and she feels like she needs for it to be suctioned out.   Upon exam, there is swelling with an indurated area over her right maxillary sinus, also appears to be some superficial fluctuance over right nare. She is currently hemodynamically stable, oxygenating well,  and protecting her airway. However, given this possible facial abscess, we will be expeditious to get imaging. Plan to obtain CT maxillary face and admit to IMTS.  COPD exacerbation ([place]) Patient was recently hospitalized for COPD exacerbation. Since she was discharged, she reports her breathing has slowly worsened. Unfortunately, due to insurance issues, she has been unable to obtain all of the necessary medications for her COPD. Has been complaint with Anoro and albuterol, but these have not helped much. Continues to have significant productive  cough and dyspnea.   Upon examination, patient does have diffuse wheezing and is coughing frequently. Thankfully, she is hemodynamically stable and does not appear septic. She is also oxygenating well on room air.  From a COPD exacerbation perspective, I think her symptoms are mild, however given her possible facial abscess we will admit to IMTS for further work-up.    Patient discussed with Dr. Kathrynn Ducking, MD Internal Medicine PGY-3 Pager: [phone]

## 2022-09-02 ENCOUNTER — Inpatient Hospital Stay (HOSPITAL_COMMUNITY): Payer: Medicare Other

## 2022-09-02 DIAGNOSIS — L03211 Cellulitis of face: Secondary | ICD-10-CM | POA: Diagnosis not present

## 2022-09-02 DIAGNOSIS — J320 Chronic maxillary sinusitis: Secondary | ICD-10-CM | POA: Diagnosis not present

## 2022-09-02 LAB — CBC
HCT: 35.7 % — ABNORMAL LOW (ref 36.0–46.0)
Hemoglobin: 12.1 g/dL (ref 12.0–15.0)
MCH: 31.6 pg (ref 26.0–34.0)
MCHC: 33.9 g/dL (ref 30.0–36.0)
MCV: 93.2 fL (ref 80.0–100.0)
Platelets: 338 10*3/uL (ref 150–400)
RBC: 3.83 MIL/uL — ABNORMAL LOW (ref 3.87–5.11)
RDW: 12.6 % (ref 11.5–15.5)
WBC: 14 10*3/uL — ABNORMAL HIGH (ref 4.0–10.5)
nRBC: 0 % (ref 0.0–0.2)

## 2022-09-02 LAB — GLUCOSE, CAPILLARY
Glucose-Capillary: 143 mg/dL — ABNORMAL HIGH (ref 70–99)
Glucose-Capillary: 206 mg/dL — ABNORMAL HIGH (ref 70–99)
Glucose-Capillary: 311 mg/dL — ABNORMAL HIGH (ref 70–99)

## 2022-09-02 LAB — MRSA NEXT GEN BY PCR, NASAL: MRSA by PCR Next Gen: NOT DETECTED

## 2022-09-02 MED ORDER — GADOBUTROL 1 MMOL/ML IV SOLN
8.0000 mL | Freq: Once | INTRAVENOUS | Status: AC | PRN
  Administered 2022-09-02: 8 mL via INTRAVENOUS

## 2022-09-02 MED ORDER — VANCOMYCIN HCL 750 MG/150ML IV SOLN
750.0000 mg | Freq: Two times a day (BID) | INTRAVENOUS | Status: DC
  Administered 2022-09-03 – 2022-09-06 (×7): 750 mg via INTRAVENOUS
  Filled 2022-09-02 (×8): qty 150

## 2022-09-02 MED ORDER — VANCOMYCIN HCL 1500 MG/300ML IV SOLN
1500.0000 mg | Freq: Once | INTRAVENOUS | Status: AC
  Administered 2022-09-02: 1500 mg via INTRAVENOUS
  Filled 2022-09-02: qty 300

## 2022-09-02 NOTE — Progress Notes (Addendum)
HD#2 Subjective:   Summary: [name] is a 71 yo F with past medical history of gold E COPD with recent admission for exacerbation, HTN, T2DM, HLD, breast cancer who was directly admitted from the clinic for facial pain and swelling.   Overnight Events: No acute events overnight  Patient endorses continued tenderness at right nostril as well as what feels like her nostril is clogged.  She states her Sudafed helped a little bit with the congestion.  States she was able to sleep okay last night.  Objective:  Vital signs in last 24 hours: Vitals:   08/31/22 0053 08/31/22 0328 08/31/22 2113 09/01/22 0457  BP:  (!) 162/72 123/60 (!) 141/93  Pulse:  95 78 74  Resp:  '18 17 17  '$ Temp:  98.1 F (36.7 C) 97.8 F (36.6 C) 98 F (36.7 C)  TempSrc:  Oral Oral Oral  SpO2:  96% 100% 100%  Weight: 79 kg     Height: '5\' 6"'$  (1.676 m)      Supplemental O2: Room Air SpO2: 100 %   Physical Exam:  Constitutional: well-appearing elderly female sitting in hospital bed, in no acute distress HENT: Skin lateral to right nare with tender, indurated area with erythema and eschar that is improving.  [place] pustular component at right nostril. Pulmonary/Chest: normal work of breathing on room air, no wheezing on auscultation Neurological: alert & oriented x 3, patient continues to be confused with what tests and treatments have been done during this hospitalization Psych: Normal mood and affect  Filed Weights   08/31/22 0053  Weight: 79 kg     Intake/Output Summary (Last 24 hours) at 09/02/2022 1527 Last data filed at 09/02/2022 0900 Gross per 24 hour  Intake 120 ml  Output --  Net 120 ml   Net IO Since Admission: 1,280 mL [09/02/22 1527]  Pertinent Labs:    Latest Ref Rng & Units 09/02/2022    3:38 AM 09/01/2022    5:33 AM 08/31/2022    3:21 AM  CBC  WBC 4.0 - 10.5 K/uL 14.0  14.3  13.2   Hemoglobin 12.0 - 15.0 g/dL 12.1  11.2  12.4   Hematocrit 36.0 - 46.0 % 35.7  34.0  37.4    Platelets 150 - 400 K/uL 338  328  380        Latest Ref Rng & Units 08/31/2022    3:21 AM 08/24/2022    3:59 AM 08/23/2022    5:14 AM  CMP  Glucose 70 - 99 mg/dL 141  269  198   BUN 8 - 23 mg/dL '9  14  15   '$ Creatinine 0.44 - 1.00 mg/dL 0.92  0.80  0.68   Sodium 135 - 145 mmol/L 135  129  130   Potassium 3.5 - 5.1 mmol/L 4.5  4.3  3.9   Chloride 98 - 111 mmol/L 98  88  93   CO2 22 - 32 mmol/L '27  29  27   '$ Calcium 8.9 - 10.3 mg/dL 8.9  8.6  8.6     Imaging: No results found.  Assessment/Plan:   Principal Problem:   Facial cellulitis Active Problems:   Chronic obstructive pulmonary disease with bronchospasm (HCC)   Chronic sinusitis   Type 2 diabetes mellitus with complication, without long-term current use of insulin (HCC)   Anxiety   Facial edema   Patient Summary: [name] is a 71 y.o. female with PMH COPD, HTN, T2DM, HLD, breast cancer who  presented with facial swelling and is admitted for right nose cellulitis and bilateral maxillary sinusitis.   #Facial cellulitis #Bilateral maxillary sinusitis Patient with cellulitis at right nose otherwise clinically stable.  Still with mild leukocytosis, though patient is on prednisone.  She remains afebrile.  Will provide IV Unasyn given concurrent sinusitis.  Will monitor for clinical response and convert to oral antibiotics when able.  Given additional pustular component to cellulitis will obtain nasal swab and add vancomycin. - IV Unasyn + vancomycin --tylenol 650 q6 PRN for pain - Trend CBC  Incidental CT finding of corpus callosum mass Patient with incidental finding on CT maxillofacial described as 6 x 6 mm at the genu of corpus callosum. -Further evaluation with MRI brain w/wo contrast   #COPD Clinically stable.  Still with coughing spells but no significant wheezing on exam today.  No concerns for exacerbation at this time. - Anoro Ellipta daily, albuterol nebs every 4 as needed - Prednisone 20 mg -  Mucinex twice daily as needed for cough   #HTN Normotensive.  She is now on her home regimen.  We will continue to monitor but expect to improve as we address other issues. -amlodipine 10 -HCTZ 25 -metoprolol tartrate 25 twice daily   #T2DM A1c 5.7 in July.  Repeat A1c 6.5 this admission.  Fasting 143 today. - Continue SSI -Continue Lyrica 100 mg 3 times daily, Cymbalta 60 mg daily - CBGs ACHS given her long course of steroids   #HLD Continue Zocor 40 mg daily  Diet: Carb-Modified VTE: Enoxaparin IVF: None,None Code: Full   Prior to Admission Living Arrangement: Home, living with husband Anticipated Discharge Location: Home Barriers to Discharge: medical management and improvement of cellulitis   Dispo: Admit patient to Observation with expected length of stay less than 2 midnights.  Linward Natal MD Internal Medicine Resident PGY-1 Please contact the on call pager after 5 pm and on weekends at [phone].

## 2022-09-02 NOTE — Progress Notes (Signed)
Pharmacy Antibiotic Note  [name] is a 70 y.o. female admitted on 08/30/2022 with facial cellulitis. Patietn has been receiving Unasyn but it is now noted there is additional pustular components to the area. Pharmacy has been consulted to add vancomycin.  Plan: Vancomycin '1500mg'$  IV x1, followed by Vancomycin '750mg'$  IV q12 hours (eAUC 483, Scr 0.8, Vd 0.7) Continue Unasyn 3g IV q6 hours Monitor renal function, clinical improvement, ability to transition to PO antibiotics Vanc levels as needed   Height: '5\' 6"'$  (167.6 cm) Weight: 79 kg (174 lb 3.2 oz) IBW/kg (Calculated) : 59.3  Temp (24hrs), Avg:99.7 F (37.6 C), Min:99.3 F (37.4 C), Max:100.4 F (38 C)  Recent Labs  Lab 08/30/22 1642 08/31/22 0321 09/01/22 0533 09/02/22 0338  WBC 15.5* 13.2* 14.3* 14.0*  CREATININE  --  0.92  --   --     Estimated Creatinine Clearance: 59.5 mL/min (by C-G formula based on SCr of 0.92 mg/dL).    Allergies  Allergen Reactions   Ace Inhibitors Swelling    Throat swelling. Lisinopril-HCTZ Pt states she in not allergic     Antimicrobials this admission: Unasyn 10/24 >>  Vancomycin 10/26 >>   Dose adjustments this admission:  Microbiology results: 10/9 Resp panel: (+) rhinovirus/enterovirus  Thank you for allowing pharmacy to be a part of this patient's care.  Dimple Nanas, PharmD, BCPS 09/02/2022 3:42 PM

## 2022-09-03 DIAGNOSIS — L03211 Cellulitis of face: Secondary | ICD-10-CM | POA: Diagnosis not present

## 2022-09-03 DIAGNOSIS — J320 Chronic maxillary sinusitis: Secondary | ICD-10-CM | POA: Diagnosis not present

## 2022-09-03 LAB — CBC WITH DIFFERENTIAL/PLATELET
Abs Immature Granulocytes: 0.16 10*3/uL — ABNORMAL HIGH (ref 0.00–0.07)
Basophils Absolute: 0 10*3/uL (ref 0.0–0.1)
Basophils Relative: 0 %
Eosinophils Absolute: 0 10*3/uL (ref 0.0–0.5)
Eosinophils Relative: 0 %
HCT: 34.3 % — ABNORMAL LOW (ref 36.0–46.0)
Hemoglobin: 11.4 g/dL — ABNORMAL LOW (ref 12.0–15.0)
Immature Granulocytes: 1 %
Lymphocytes Relative: 18 %
Lymphs Abs: 2.3 10*3/uL (ref 0.7–4.0)
MCH: 31.3 pg (ref 26.0–34.0)
MCHC: 33.2 g/dL (ref 30.0–36.0)
MCV: 94.2 fL (ref 80.0–100.0)
Monocytes Absolute: 0.5 10*3/uL (ref 0.1–1.0)
Monocytes Relative: 4 %
Neutro Abs: 9.6 10*3/uL — ABNORMAL HIGH (ref 1.7–7.7)
Neutrophils Relative %: 77 %
Platelets: 287 10*3/uL (ref 150–400)
RBC: 3.64 MIL/uL — ABNORMAL LOW (ref 3.87–5.11)
RDW: 12.6 % (ref 11.5–15.5)
WBC: 12.6 10*3/uL — ABNORMAL HIGH (ref 4.0–10.5)
nRBC: 0 % (ref 0.0–0.2)

## 2022-09-03 LAB — BASIC METABOLIC PANEL
Anion gap: 10 (ref 5–15)
BUN: 8 mg/dL (ref 8–23)
CO2: 26 mmol/L (ref 22–32)
Calcium: 8.4 mg/dL — ABNORMAL LOW (ref 8.9–10.3)
Chloride: 97 mmol/L — ABNORMAL LOW (ref 98–111)
Creatinine, Ser: 0.65 mg/dL (ref 0.44–1.00)
GFR, Estimated: >60 mL/min (ref >60)
Glucose, Bld: 157 mg/dL — ABNORMAL HIGH (ref 70–99)
Potassium: 3.5 mmol/L (ref 3.5–5.1)
Sodium: 133 mmol/L — ABNORMAL LOW (ref 135–145)

## 2022-09-03 LAB — GLUCOSE, CAPILLARY
Glucose-Capillary: 161 mg/dL — ABNORMAL HIGH (ref 70–99)
Glucose-Capillary: 153 mg/dL — ABNORMAL HIGH (ref 70–99)
Glucose-Capillary: 193 mg/dL — ABNORMAL HIGH (ref 70–99)
Glucose-Capillary: 277 mg/dL — ABNORMAL HIGH (ref 70–99)
Glucose-Capillary: 201 mg/dL — ABNORMAL HIGH (ref 70–99)

## 2022-09-03 MED ORDER — MUPIROCIN CALCIUM 2 % EX CREA
TOPICAL_CREAM | Freq: Two times a day (BID) | CUTANEOUS | Status: DC
  Filled 2022-09-03: qty 15

## 2022-09-03 MED ORDER — OXYCODONE HCL 5 MG PO TABS
5.0000 mg | ORAL_TABLET | Freq: Once | ORAL | Status: AC
  Administered 2022-09-03: 5 mg via ORAL
  Filled 2022-09-03: qty 1

## 2022-09-03 MED ORDER — PREDNISONE 10 MG PO TABS
10.0000 mg | ORAL_TABLET | Freq: Every day | ORAL | Status: DC
  Administered 2022-09-04 – 2022-09-06 (×3): 10 mg via ORAL
  Filled 2022-09-03 (×3): qty 1

## 2022-09-03 MED ORDER — LIDOCAINE HCL (PF) 1 % IJ SOLN
INTRAMUSCULAR | Status: AC
  Filled 2022-09-03: qty 30

## 2022-09-03 MED ORDER — FENTANYL CITRATE PF 50 MCG/ML IJ SOSY
50.0000 ug | PREFILLED_SYRINGE | Freq: Once | INTRAMUSCULAR | Status: DC
  Filled 2022-09-03: qty 1

## 2022-09-03 MED ORDER — IPRATROPIUM-ALBUTEROL 0.5-2.5 (3) MG/3ML IN SOLN
3.0000 mL | Freq: Three times a day (TID) | RESPIRATORY_TRACT | Status: DC
  Administered 2022-09-03 – 2022-09-05 (×4): 3 mL via RESPIRATORY_TRACT
  Filled 2022-09-03 (×5): qty 3

## 2022-09-03 MED ORDER — OXYCODONE HCL 5 MG PO TABS
5.0000 mg | ORAL_TABLET | Freq: Four times a day (QID) | ORAL | Status: DC | PRN
  Administered 2022-09-04: 5 mg via ORAL
  Filled 2022-09-03: qty 1

## 2022-09-03 MED ORDER — LIDOCAINE-EPINEPHRINE 1 %-1:100000 IJ SOLN
20.0000 mL | Freq: Once | INTRAMUSCULAR | Status: AC
  Administered 2022-09-03: 20 mL via INTRADERMAL
  Filled 2022-09-03 (×2): qty 20

## 2022-09-03 NOTE — Care Management Important Message (Signed)
Important Message  Patient Details  Name: [name] MRN: [id number] Date of Birth: 29-Jun-1951   Medicare Important Message Given:  Yes     Hannah Beat 09/03/2022, 2:42 PM

## 2022-09-03 NOTE — Progress Notes (Signed)
HD#3 Subjective:   Summary: [name] is a 71 yo F with past medical history of gold E COPD with recent admission for exacerbation, HTN, T2DM, HLD, breast cancer who was directly admitted from the clinic for facial pain and swelling.   Overnight Events: No acute events overnight  Patient reports some improvement in her right nose pain and swelling.  She states she slept okay last night.  Her congestion is improved.  She continues to express concern about whether or not her skin will ever look like it did before.  We discussed the encouraging results from the MRI regarding mass that her corpus callosum.  She denies any shortness of breath at this time.  Objective:  Vital signs in last 24 hours: Vitals:   08/31/22 0053 08/31/22 0328 08/31/22 2113 09/01/22 0457  BP:  (!) 162/72 123/60 (!) 141/93  Pulse:  95 78 74  Resp:  '18 17 17  '$ Temp:  98.1 F (36.7 C) 97.8 F (36.6 C) 98 F (36.7 C)  TempSrc:  Oral Oral Oral  SpO2:  96% 100% 100%  Weight: 79 kg     Height: '5\' 6"'$  (1.676 m)      Supplemental O2: Room Air SpO2: 100 %   Physical Exam:  Constitutional: well-appearing elderly female sitting in hospital bed, in no acute distress HENT: Skin lateral to right nare with tender, indurated area with erythema and eschar that is improving.  Cullan Launer pustular component at right nostril seems to have dissipated. Pulmonary/Chest: normal work of breathing on room air Neurological: alert & oriented x 3 Psych: Normal mood and affect  Filed Weights   08/31/22 0053  Weight: 79 kg     Intake/Output Summary (Last 24 hours) at 09/03/2022 1718 Last data filed at 09/03/2022 0423 Gross per 24 hour  Intake 554.28 ml  Output --  Net 554.28 ml   Net IO Since Admission: 2,194.28 mL [09/03/22 1718]  Pertinent Labs:    Latest Ref Rng & Units 09/03/2022    4:15 AM 09/02/2022    3:38 AM 09/01/2022    5:33 AM  CBC  WBC 4.0 - 10.5 K/uL 12.6  14.0  14.3   Hemoglobin 12.0 - 15.0 g/dL 11.4   12.1  11.2   Hematocrit 36.0 - 46.0 % 34.3  35.7  34.0   Platelets 150 - 400 K/uL 287  338  328        Latest Ref Rng & Units 09/03/2022    4:15 AM 08/31/2022    3:21 AM 08/24/2022    3:59 AM  CMP  Glucose 70 - 99 mg/dL 157  141  269   BUN 8 - 23 mg/dL '8  9  14   '$ Creatinine 0.44 - 1.00 mg/dL 0.65  0.92  0.80   Sodium 135 - 145 mmol/L 133  135  129   Potassium 3.5 - 5.1 mmol/L 3.5  4.5  4.3   Chloride 98 - 111 mmol/L 97  98  88   CO2 22 - 32 mmol/L '26  27  29   '$ Calcium 8.9 - 10.3 mg/dL 8.4  8.9  8.6     Imaging: MR BRAIN W WO CONTRAST  Result Date: 09/02/2022 CLINICAL DATA:  Evaluate lesion at genu of corpus callosum, cellulitis of right nose EXAM: MRI HEAD WITHOUT AND WITH CONTRAST TECHNIQUE: Multiplanar, multiecho pulse sequences of the brain and surrounding structures were obtained without and with intravenous contrast. CONTRAST:  68m GADAVIST GADOBUTROL 1 MMOL/ML IV SOLN COMPARISON:  No prior MRI head, correlation is made with CT maxillofacial 08/30/2022 FINDINGS: Brain: No abnormal parenchymal or meningeal enhancement. The previously described lesion in the genu of the corpus callosum correlates with a slightly atypical appearance of a cavum septum pellucidum, a common anatomic variant. No restricted diffusion to suggest acute or subacute infarct. No acute hemorrhage, mass, mass effect, midline shift. No hemosiderin deposition to suggest remote hemorrhage. No hydrocephalus or extra-axial collection. T2 hyperintense signal in the periventricular Elihue Ebert matter, likely the sequela of mild chronic small vessel ischemic disease. Vascular: Normal arterial flow voids. Skull and upper cervical spine: Normal marrow signal. Sinuses/Orbits: Air-fluid levels in the left-greater-than-right maxillary sinus. Mucosal thickening in the maxillary sinuses and ethmoid air cells. Orbits are unremarkable. Trace fluid in the mastoid air cells. Other: Peripherally enhancing collection in the soft tissues of the  right nares. The larger collection measures 1.9 x 1.0 x 1.0 cm (AP x TR x CC) (series 22, image 6 and series 24, image 10). A smaller collection in the right pre malar soft tissues measures 1.3 x 1.1 x 1.4 cm (AP x TR x CC) (series 22, image 10 and series 24, image 9). Both collections are T1 hypointense, T2 hyperintense, and peripherally enhancing, most likely small abscesses. IMPRESSION: 1. No acute intracranial process. The previously described lesion in the genu of the corpus callosum correlates with a slightly atypical appearance of a cavum septum pellucidum, a common anatomic variant. 2. Peripherally enhancing collections in the soft tissues of the right nares and right pre malar soft tissues, most likely small abscesses. 3. Air-fluid levels in the left-greater-than-right maxillary sinus, which can be seen in the setting of acute sinusitis. Electronically Signed   By: Merilyn Baba M.D.   On: 09/02/2022 23:48    Assessment/Plan:   Principal Problem:   Facial cellulitis Active Problems:   Chronic obstructive pulmonary disease with bronchospasm (HCC)   Chronic sinusitis   Type 2 diabetes mellitus with complication, without long-term current use of insulin (HCC)   Anxiety   Facial edema   Patient Summary: [name] is a 71 y.o. female with PMH COPD, HTN, T2DM, HLD, breast cancer who presented with facial swelling and is admitted for right nose cellulitis and bilateral maxillary sinusitis.   #Facial cellulitis #Bilateral maxillary sinusitis Patient with cellulitis at right nose otherwise clinically stable.  Still with mild leukocytosis, though patient is on prednisone which we are tapering.  She remains afebrile.  We will continue on Unasyn and vancomycin IV.  MRI demonstrated to abscesses, one at right nare and 1 at pre-malar tissue.  ENT consulted and performed I&D today.  Will start topical Bactroban, soap and water wash twice daily, warm compresses every 2 hours, and as needed pain  medication.  ENT to reassess tomorrow, considering washout in the OR. -ENT following, appreciate assistance - IV Unasyn + vancomycin -tylenol 650 q6 PRN, oxycodone 5 mg every 6 hours as needed - Trend CBC -Soap and water wash twice daily -Warm compress every 2 hours -N.p.o. at midnight  Incidental CT finding of corpus callosum mass Patient with incidental finding on CT maxillofacial described as 6 x 6 mm at the genu of corpus callosum.  Follow-up MRI brain demonstrated anatomical variant with no need for further work-up.   #COPD Clinically stable.  Satting well on room air. coughing spells seem to have subsided.  No concerns for exacerbation at this time. - Anoro Ellipta daily, albuterol nebs every 4 as needed - DuoNebs 3 times daily,  scheduled - Prednisone 20 mg - Mucinex twice daily as needed for cough   #HTN Normotensive.  She is now on her home regimen.  We will continue to monitor but expect to improve as we address other issues. -amlodipine 10 -HCTZ 25 -metoprolol tartrate 25 twice daily   #T2DM A1c 5.7 in July.  Repeat A1c 6.5 this admission.  Fasting 153 today. - Continue SSI -Continue Lyrica 100 mg 3 times daily, Cymbalta 60 mg daily - CBGs ACHS given her long course of steroids   #HLD Continue Zocor 40 mg daily  Diet: Carb-Modified VTE: Enoxaparin IVF: None,None Code: Full   Prior to Admission Living Arrangement: Home, living with husband Anticipated Discharge Location: Home Barriers to Discharge: medical management and improvement of cellulitis   Dispo: Admit patient to Observation with expected length of stay less than 2 midnights.  Linward Natal MD Internal Medicine Resident PGY-1 Please contact the on call pager after 5 pm and on weekends at [phone].

## 2022-09-03 NOTE — Consult Note (Signed)
ENT Brief consult note  Called by primary team for right nasal abscess.  Reviewed recent MRI - drainable nasal abscess evident.  Please have nursing provide supplies for incision and drainage  Local: 1% lidocaine w 1:100K epinephrine, 5 mL syringe, 18 gauge needle, 27 gauge needle Additional 5 mL syringe and 18 gauge needle for aspiration of pus  Analgesia: Fentany 50-100 mcg IV pre-procedure   Equipment: 15 blade or 11 blade. Saline flushes x 5. 4x4 gauze. 1/4" Strip gauze plain packing. Tonsil or mosquito dissector. Cue tips  Plan for incision and drainage 1130-1230 time frame.   Electronically signed by:  Jenetta Downer, MD  Staff Physician Facial Plastic & Reconstructive Surgery Otolaryngology - Head and Neck Surgery [place], [place]

## 2022-09-03 NOTE — Procedures (Signed)
ENT PROCEDURE NOTE:  Procedure: Incision incision and drainage of complicated multiple nasal abscesses cpt 10061  After verbal consent was obtained for the procedure including risk of pain, bleeding, ongoing infection the patient consented to the procedure.  The nose was anesthetized 1% lidocaine 1 100,000 epinephrine epinephrine.  After adequate time to take effect nose was cleansed with Betadine.  Next a 11 blade was used to incise the a left 3 of prior pinhole that was draining pus as well as the nasal vestibular lining.  Tonsil dissector was used to expand the cavity and interrogate the multiple abscesses.  Several cc of purulent drainage egressed.  Firm palpation of the nose was performed however this was significantly painful and not well-tolerated by the patient.  The note nasal cavity white was washed out with nasal saline on a angiocatheter.  Strip gauze packing was placed in the right nasal alar wound.  The patient tolerated the procedure well and was given oral analgesia with oxycodone during the procedure.  Plan:   Implement wound care i.e. soap and water washes and Bactroban multiple BID. Warm compresses Q2 hours Continue IV antibiotics with MRSA coverage Follow-up wound cultures Pain medications  NPO after midnight for possible OR washout under GA if worsening infection    Electronically signed by:  Jenetta Downer, MD  Staff Physician Facial Plastic & Reconstructive Surgery Otolaryngology - Head and [place], [place]

## 2022-09-03 NOTE — Consult Note (Signed)
ENT CONSULT:  Reason for Consult: Nasal abscess  Referring Physician:  Linward Natal MD  HPI: [name] is an 71 y.o. female history of type 2 diabetes mellitus on insulin who was admitted 4 days ago, August 30, 2022 for facial cellulitis.  Initial imaging upon admission was negative for an obvious abscess however there was phlegmon noted by the radiologist.  The patient has been on IV Unasyn and vancomycin.  Over the course the last day her nose is developed increasing swelling and MRI of the brain commented on facial abscesses.  ENT was thus consulted for management.  Patient endorses significant pain around her nose and drainage.  She states she is very frustrated with the issues she has been dealing with in the hospital\.   Past Medical History:  Diagnosis Date   Acute sinusitis 01/09/2019   Anxiety    Arthritis    "fingers, knees" (08/16/2018)   Asthma    Atopic dermatitis 03/20/2018   Axillary hidradenitis suppurativa 10/13/2018   Cancer of right breast ([place]) 1991   s/p lumpectomy, chemotherapy and radiation therapy in 1991. Mammogram in 2007 was normal.   Constipated    h/o   COPD (chronic obstructive pulmonary disease) (HCC)    History of multiple hospital admissions for exercabation    COPD exacerbation ([place]) 01/01/2019   COPD with acute exacerbation ([place]) 01/14/2019   COPD with exacerbation ([place]) 04/06/2009   Qualifier: Diagnosis of  By: Eyvonne Mechanic MD, Vijay     Depression    Diarrhea    h/o   GERD (gastroesophageal reflux disease)    Headache    "a few times/month" (08/16/2018   Heart murmur 10/05/11   "first time I ever heard I had one was today"   History of breast cancer 12/01/2012   Pt with h/o breast CA s/p lumpectomy with chemo/radiation in 1991. Pt mammogram 2011 was unremarkable. Had CT chest 10/2012 in ED for SOB and showed spiculated nodule with lymph node. 12/04/12: Birads 2; repeat diagnostic mammogram in 1 year.     Hyperlipidemia    Hypertension     Lower extremity edema 09/21/2018   Obesity    Personal history of chemotherapy    Personal history of radiation therapy    Pneumonia    "couple times in the last 10-15 yrs" (08/16/2018)   QT prolongation 08/08/2014   Seasonal allergies 02/25/2017   Shortness of breath 10/05/11   "at rest; lying down; w/exertion"   Sigmoid diverticulitis 80/2008   Tobacco abuse    Type 2 diabetes mellitus ([place]) 05/14/2009   Type II diabetes mellitus ([place])     Past Surgical History:  Procedure Laterality Date   ABDOMINAL HYSTERECTOMY     ANTERIOR CERVICAL DECOMP/DISCECTOMY FUSION  2012   "Dr. Lynann Bologna  put plate in; did something to my vertebrae"   BACK SURGERY     BREAST LUMPECTOMY Right 1991   DOBUTAMINE STRESS ECHO  08/2004   Inferior ischemia, normal LV systolic function, no significant CAD    Family History  Problem Relation Age of Onset   Cancer Mother    Breast cancer Daughter 37    Social History:  reports that she has been smoking cigarettes. She has a 22.50 pack-year smoking history. She has never used smokeless tobacco. She reports current alcohol use of about 4.0 standard drinks of alcohol per week. She reports that she does not use drugs.  Allergies:  Allergies  Allergen Reactions   Ace Inhibitors Swelling    Throat  swelling. Lisinopril-HCTZ Pt states she in not allergic     Medications: I have reviewed the patient's current medications.  Results for orders placed or performed during the hospital encounter of 08/30/22 (from the past 48 hour(s))  Glucose, capillary     Status: Abnormal   Collection Time: 09/01/22  9:28 PM  Result Value Ref Range   Glucose-Capillary 224 (H) 70 - 99 mg/dL    Comment: Glucose reference range applies only to samples taken after fasting for at least 8 hours.  CBC     Status: Abnormal   Collection Time: 09/02/22  3:38 AM  Result Value Ref Range   WBC 14.0 (H) 4.0 - 10.5 K/uL   RBC 3.83 (L) 3.87 - 5.11 MIL/uL   Hemoglobin 12.1 12.0 - 15.0 g/dL    HCT 35.7 (L) 36.0 - 46.0 %   MCV 93.2 80.0 - 100.0 fL   MCH 31.6 26.0 - 34.0 pg   MCHC 33.9 30.0 - 36.0 g/dL   RDW 12.6 11.5 - 15.5 %   Platelets 338 150 - 400 K/uL   nRBC 0.0 0.0 - 0.2 %    Comment: Performed at [facility], [place] [address], [place], [place] [address]  Glucose, capillary     Status: Abnormal   Collection Time: 09/02/22  7:40 AM  Result Value Ref Range   Glucose-Capillary 143 (H) 70 - 99 mg/dL    Comment: Glucose reference range applies only to samples taken after fasting for at least 8 hours.  MRSA Next Gen by PCR, Nasal     Status: None   Collection Time: 09/02/22 10:33 AM   Specimen: Nasal Mucosa; Nasal Swab  Result Value Ref Range   MRSA by PCR Next Gen NOT DETECTED NOT DETECTED    Comment: (NOTE) The GeneXpert MRSA Assay (FDA approved for NASAL specimens only), is one component of a comprehensive MRSA colonization surveillance program. It is not intended to diagnose MRSA infection nor to guide or monitor treatment for MRSA infections. Test performance is not FDA approved in patients less than 63 years old. Performed at [facility], [place] [address] [place], [place] [address]   Glucose, capillary     Status: Abnormal   Collection Time: 09/02/22 12:10 PM  Result Value Ref Range   Glucose-Capillary 206 (H) 70 - 99 mg/dL    Comment: Glucose reference range applies only to samples taken after fasting for at least 8 hours.  Glucose, capillary     Status: Abnormal   Collection Time: 09/02/22  5:12 PM  Result Value Ref Range   Glucose-Capillary 311 (H) 70 - 99 mg/dL    Comment: Glucose reference range applies only to samples taken after fasting for at least 8 hours.  CBC with Differential/Platelet     Status: Abnormal   Collection Time: 09/03/22  4:15 AM  Result Value Ref Range   WBC 12.6 (H) 4.0 - 10.5 K/uL   RBC 3.64 (L) 3.87 - 5.11 MIL/uL   Hemoglobin 11.4 (L) 12.0 - 15.0 g/dL   HCT 34.3 (L) 36.0 - 46.0 %   MCV 94.2 80.0 - 100.0 fL   MCH  31.3 26.0 - 34.0 pg   MCHC 33.2 30.0 - 36.0 g/dL   RDW 12.6 11.5 - 15.5 %   Platelets 287 150 - 400 K/uL   nRBC 0.0 0.0 - 0.2 %   Neutrophils Relative % 77 %   Neutro Abs 9.6 (H) 1.7 - 7.7 K/uL   Lymphocytes Relative  18 %   Lymphs Abs 2.3 0.7 - 4.0 K/uL   Monocytes Relative 4 %   Monocytes Absolute 0.5 0.1 - 1.0 K/uL   Eosinophils Relative 0 %   Eosinophils Absolute 0.0 0.0 - 0.5 K/uL   Basophils Relative 0 %   Basophils Absolute 0.0 0.0 - 0.1 K/uL   Immature Granulocytes 1 %   Abs Immature Granulocytes 0.16 (H) 0.00 - 0.07 K/uL    Comment: Performed at [facility] [address], [place], [place] [address]  Basic metabolic panel     Status: Abnormal   Collection Time: 09/03/22  4:15 AM  Result Value Ref Range   Sodium 133 (L) 135 - 145 mmol/L   Potassium 3.5 3.5 - 5.1 mmol/L   Chloride 97 (L) 98 - 111 mmol/L   CO2 26 22 - 32 mmol/L   Glucose, Bld 157 (H) 70 - 99 mg/dL    Comment: Glucose reference range applies only to samples taken after fasting for at least 8 hours.   BUN 8 8 - 23 mg/dL   Creatinine, Ser 0.65 0.44 - 1.00 mg/dL   Calcium 8.4 (L) 8.9 - 10.3 mg/dL   GFR, Estimated >60 >60 mL/min    Comment: (NOTE) Calculated using the CKD-EPI Creatinine Equation (2021)    Anion gap 10 5 - 15    Comment: Performed at [facility] [address], [place], [place] [address]  Glucose, capillary     Status: Abnormal   Collection Time: 09/03/22  8:59 AM  Result Value Ref Range   Glucose-Capillary 153 (H) 70 - 99 mg/dL    Comment: Glucose reference range applies only to samples taken after fasting for at least 8 hours.  Glucose, capillary     Status: Abnormal   Collection Time: 09/03/22 11:52 AM  Result Value Ref Range   Glucose-Capillary 193 (H) 70 - 99 mg/dL    Comment: Glucose reference range applies only to samples taken after fasting for at least 8 hours.   Comment 1 Notify RN   Glucose, capillary     Status: Abnormal   Collection Time: 09/03/22  5:24 PM   Result Value Ref Range   Glucose-Capillary 277 (H) 70 - 99 mg/dL    Comment: Glucose reference range applies only to samples taken after fasting for at least 8 hours.   Comment 1 Notify RN     MR BRAIN W WO CONTRAST  Result Date: 09/02/2022 CLINICAL DATA:  Evaluate lesion at genu of corpus callosum, cellulitis of right nose EXAM: MRI HEAD WITHOUT AND WITH CONTRAST TECHNIQUE: Multiplanar, multiecho pulse sequences of the brain and surrounding structures were obtained without and with intravenous contrast. CONTRAST:  66m GADAVIST GADOBUTROL 1 MMOL/ML IV SOLN COMPARISON:  No prior MRI head, correlation is made with CT maxillofacial 08/30/2022 FINDINGS: Brain: No abnormal parenchymal or meningeal enhancement. The previously described lesion in the genu of the corpus callosum correlates with a slightly atypical appearance of a cavum septum pellucidum, a common anatomic variant. No restricted diffusion to suggest acute or subacute infarct. No acute hemorrhage, mass, mass effect, midline shift. No hemosiderin deposition to suggest remote hemorrhage. No hydrocephalus or extra-axial collection. T2 hyperintense signal in the periventricular white matter, likely the sequela of mild chronic small vessel ischemic disease. Vascular: Normal arterial flow voids. Skull and upper cervical spine: Normal marrow signal. Sinuses/Orbits: Air-fluid levels in the left-greater-than-right maxillary sinus. Mucosal thickening in the maxillary sinuses and ethmoid air cells. Orbits are unremarkable. Trace  fluid in the mastoid air cells. Other: Peripherally enhancing collection in the soft tissues of the right nares. The larger collection measures 1.9 x 1.0 x 1.0 cm (AP x TR x CC) (series 22, image 6 and series 24, image 10). A smaller collection in the right pre malar soft tissues measures 1.3 x 1.1 x 1.4 cm (AP x TR x CC) (series 22, image 10 and series 24, image 9). Both collections are T1 hypointense, T2 hyperintense, and  peripherally enhancing, most likely small abscesses. IMPRESSION: 1. No acute intracranial process. The previously described lesion in the genu of the corpus callosum correlates with a slightly atypical appearance of a cavum septum pellucidum, a common anatomic variant. 2. Peripherally enhancing collections in the soft tissues of the right nares and right pre malar soft tissues, most likely small abscesses. 3. Air-fluid levels in the left-greater-than-right maxillary sinus, which can be seen in the setting of acute sinusitis. Electronically Signed   By: Merilyn Baba M.D.   On: 09/02/2022 23:48    [id number] other than stated per HPI  Blood pressure (!) 141/93, pulse 74, temperature 98 F (36.7 C), temperature source Oral, resp. rate 17, height '5\' 6"'$  (1.676 m), weight 79 kg, SpO2 100 %.  PHYSICAL EXAM:  CONSTITUTIONAL: well developed, nourished, no distress and alert and oriented x 3.  HENT: Head : normocephalic and atraumatic Ears: External ears normal  Nose:  Right nasal deformity with ballotable fluid collection right nasal ala with purulent drainage from the lateral alar skin and along the vestibular with ulceration on both sides.  Significant tenderness to palpation.  Mouth/Throat:  Mouth: uvula midline and no oral lesions Throat: oropharynx clear and moist Mucous membranes: normal EYES: conjunctiva normal, EOM normal and PERRL NECK: supple, trachea normal and no thyromegaly or cervical LAD  Studies Reviewed: CT Maxillofacial with contrast 08/30/22 IMPRESSION: 1. Soft tissue stranding in the premaxillary soft tissues on the right with extension along the right nare, consistent with cellulitis. More discrete region of soft tissue measuring approximately 1.0 x 1.0 cm in the premaxillary soft tissues likely represents phlegmonous changes and correlates with the palpable abnormality on clinical exam. 2. Air-fluid levels in bilateral maxillary sinuses, concerning for acute  sinusitis. 3. Nonspecific 6 mm x 6 mm lesion in the genu of the corpus callosum. Recommend MRI of the brain with and without contrast for further evaluation.     Electronically Signed   By: Marin Roberts M.D.   On: 08/30/2022 14:27  MRI w/wo contrast 09/02/22    IMPRESSION: 1. No acute intracranial process. The previously described lesion in the genu of the corpus callosum correlates with a slightly atypical appearance of a cavum septum pellucidum, a common anatomic variant. 2. Peripherally enhancing collections in the soft tissues of the right nares and right pre malar soft tissues, most likely small abscesses. 3. Air-fluid levels in the left-greater-than-right maxillary sinus, which can be seen in the setting of acute sinusitis.     Electronically Signed   By: Merilyn Baba M.D.   On: 09/02/2022 23:48  Assessment/Plan: 71 year-old diabetic female with right nasal abscess with severe soft tissue skin infection status post incision and drainage, washout at the bedside.  Implement wound care i.e. soap and water washes and Bactroban multiple BID. Warm compresses Q2 hours Continue IV antibiotics with MRSA coverage Follow-up wound cultures Pain medications  NPO after midnight for possible OR washout under GA if worsening infection  I have personally spent 46 minutes involved in face-to-face  and non-face-to-face activities for this patient on the day of the visit.  Professional time spent includes the following activities, in addition to those noted in the documentation: preparing to see the patient (eg, review of tests), obtaining and/or reviewing separately obtained history, performing a medically appropriate examination and/or evaluation, counseling and educating the patient/family/caregiver, ordering medications, tests or procedures, referring and communicating with other healthcare professionals, documenting clinical information in the electronic or other health record,  independently interpreting results and communicating results with the patient/family/caregiver, care coordination.  Electronically signed by:  Jenetta Downer, MD  Staff Physician Facial Plastic & Reconstructive Surgery Otolaryngology - Head and Neck Surgery [place]   09/03/2022, 7:03 PM

## 2022-09-03 NOTE — Progress Notes (Signed)
Mobility Specialist - Progress Note   09/03/22 1100  Mobility  Activity Ambulated independently in room  Level of Assistance Independent  Assistive Device None  Distance Ambulated (ft) 15 ft  Activity Response Tolerated well  $Mobility charge 1 Mobility    Pt received in bed agreeable to mobility. Lunch was delivered to room during ambulation so pt requested to return to bed to eat. Left in bed w/ call bell in reach and all needs met.   Paulla Dolly Mobility Specialist

## 2022-09-04 ENCOUNTER — Encounter (HOSPITAL_COMMUNITY)
Admission: RE | Disposition: A | Discharge status: Home / Self Care | Payer: Self-pay | Source: Ambulatory Visit | Attending: Student in an Organized Health Care Education/Training Program

## 2022-09-04 ENCOUNTER — Inpatient Hospital Stay (HOSPITAL_COMMUNITY): Payer: Medicare Other | Admitting: Anesthesiology

## 2022-09-04 ENCOUNTER — Other Ambulatory Visit: Payer: Self-pay

## 2022-09-04 ENCOUNTER — Encounter (HOSPITAL_COMMUNITY): Payer: Self-pay | Admitting: Student in an Organized Health Care Education/Training Program

## 2022-09-04 DIAGNOSIS — J449 Chronic obstructive pulmonary disease, unspecified: Secondary | ICD-10-CM

## 2022-09-04 DIAGNOSIS — I10 Essential (primary) hypertension: Secondary | ICD-10-CM

## 2022-09-04 DIAGNOSIS — F17210 Nicotine dependence, cigarettes, uncomplicated: Secondary | ICD-10-CM

## 2022-09-04 DIAGNOSIS — J320 Chronic maxillary sinusitis: Secondary | ICD-10-CM | POA: Diagnosis not present

## 2022-09-04 DIAGNOSIS — J340 Abscess, furuncle and carbuncle of nose: Secondary | ICD-10-CM

## 2022-09-04 DIAGNOSIS — L03211 Cellulitis of face: Secondary | ICD-10-CM | POA: Diagnosis not present

## 2022-09-04 HISTORY — PX: MINOR IRRIGATION AND DEBRIDEMENT OF WOUND: SHX6239

## 2022-09-04 LAB — CBC
HCT: 31.8 % — ABNORMAL LOW (ref 36.0–46.0)
Hemoglobin: 11.1 g/dL — ABNORMAL LOW (ref 12.0–15.0)
MCH: 32 pg (ref 26.0–34.0)
MCHC: 34.9 g/dL (ref 30.0–36.0)
MCV: 91.6 fL (ref 80.0–100.0)
Platelets: 278 10*3/uL (ref 150–400)
RBC: 3.47 MIL/uL — ABNORMAL LOW (ref 3.87–5.11)
RDW: 12.6 % (ref 11.5–15.5)
WBC: 12.9 10*3/uL — ABNORMAL HIGH (ref 4.0–10.5)
nRBC: 0 % (ref 0.0–0.2)

## 2022-09-04 LAB — GLUCOSE, CAPILLARY
Glucose-Capillary: 135 mg/dL — ABNORMAL HIGH (ref 70–99)
Glucose-Capillary: 134 mg/dL — ABNORMAL HIGH (ref 70–99)
Glucose-Capillary: 279 mg/dL — ABNORMAL HIGH (ref 70–99)
Glucose-Capillary: 285 mg/dL — ABNORMAL HIGH (ref 70–99)
Glucose-Capillary: 362 mg/dL — ABNORMAL HIGH (ref 70–99)

## 2022-09-04 LAB — BASIC METABOLIC PANEL
Anion gap: 9 (ref 5–15)
BUN: 12 mg/dL (ref 8–23)
CO2: 26 mmol/L (ref 22–32)
Calcium: 8.1 mg/dL — ABNORMAL LOW (ref 8.9–10.3)
Chloride: 95 mmol/L — ABNORMAL LOW (ref 98–111)
Creatinine, Ser: 0.75 mg/dL (ref 0.44–1.00)
GFR, Estimated: >60 mL/min (ref >60)
Glucose, Bld: 154 mg/dL — ABNORMAL HIGH (ref 70–99)
Potassium: 3.9 mmol/L (ref 3.5–5.1)
Sodium: 130 mmol/L — ABNORMAL LOW (ref 135–145)

## 2022-09-04 SURGERY — MINOR IRRIGATION AND DEBRIDEMENT OF WOUND
Anesthesia: General | Site: Nose | Laterality: Right

## 2022-09-04 MED ORDER — ACETAMINOPHEN 10 MG/ML IV SOLN: 1000.0000 mg | Freq: Once | INTRAVENOUS | Status: DC | PRN

## 2022-09-04 MED ORDER — SODIUM CHLORIDE 0.9 % IR SOLN
Status: DC | PRN
  Administered 2022-09-04: 500 mL

## 2022-09-04 MED ORDER — LIDOCAINE 2% (20 MG/ML) 5 ML SYRINGE
INTRAMUSCULAR | Status: DC | PRN
  Administered 2022-09-04: 80 mg via INTRAVENOUS

## 2022-09-04 MED ORDER — SUCCINYLCHOLINE CHLORIDE 200 MG/10ML IV SOSY
PREFILLED_SYRINGE | INTRAVENOUS | Status: DC | PRN
  Administered 2022-09-04: 50 mg via INTRAVENOUS

## 2022-09-04 MED ORDER — FENTANYL CITRATE (PF) 100 MCG/2ML IJ SOLN: 25.0000 ug | INTRAMUSCULAR | Status: DC | PRN

## 2022-09-04 MED ORDER — ONDANSETRON HCL 4 MG/2ML IJ SOLN
INTRAMUSCULAR | Status: DC | PRN
  Administered 2022-09-04: 4 mg via INTRAVENOUS

## 2022-09-04 MED ORDER — PROPOFOL 10 MG/ML IV BOLUS
INTRAVENOUS | Status: AC
  Filled 2022-09-04: qty 20

## 2022-09-04 MED ORDER — LACTATED RINGERS IV SOLN: INTRAVENOUS | Status: DC

## 2022-09-04 MED ORDER — CHLORHEXIDINE GLUCONATE 0.12 % MT SOLN: 15.0000 mL | Freq: Once | OROMUCOSAL | Status: AC

## 2022-09-04 MED ORDER — BUPIVACAINE HCL (PF) 0.25 % IJ SOLN
INTRAMUSCULAR | Status: AC
  Filled 2022-09-04: qty 10

## 2022-09-04 MED ORDER — ALBUTEROL SULFATE HFA 108 (90 BASE) MCG/ACT IN AERS
INHALATION_SPRAY | RESPIRATORY_TRACT | Status: AC
  Filled 2022-09-04: qty 6.7

## 2022-09-04 MED ORDER — CHLORHEXIDINE GLUCONATE 0.12 % MT SOLN
OROMUCOSAL | Status: AC
  Administered 2022-09-04: 15 mL via OROMUCOSAL
  Filled 2022-09-04: qty 15

## 2022-09-04 MED ORDER — DEXAMETHASONE SODIUM PHOSPHATE 10 MG/ML IJ SOLN
INTRAMUSCULAR | Status: DC | PRN
  Administered 2022-09-04: 10 mg via INTRAVENOUS

## 2022-09-04 MED ORDER — DEXAMETHASONE SODIUM PHOSPHATE 10 MG/ML IJ SOLN
INTRAMUSCULAR | Status: AC
  Filled 2022-09-04: qty 1

## 2022-09-04 MED ORDER — ROCURONIUM BROMIDE 10 MG/ML (PF) SYRINGE
PREFILLED_SYRINGE | INTRAVENOUS | Status: AC
  Filled 2022-09-04: qty 10

## 2022-09-04 MED ORDER — MUPIROCIN 2 % EX OINT
TOPICAL_OINTMENT | Freq: Three times a day (TID) | CUTANEOUS | Status: DC
  Filled 2022-09-04 (×2): qty 22

## 2022-09-04 MED ORDER — ORAL CARE MOUTH RINSE: 15.0000 mL | Freq: Once | OROMUCOSAL | Status: AC

## 2022-09-04 MED ORDER — ALBUTEROL SULFATE HFA 108 (90 BASE) MCG/ACT IN AERS
INHALATION_SPRAY | RESPIRATORY_TRACT | Status: DC | PRN
  Administered 2022-09-04 (×2): 4 via RESPIRATORY_TRACT

## 2022-09-04 MED ORDER — INSULIN ASPART 100 UNIT/ML IJ SOLN: 0.0000 [IU] | INTRAMUSCULAR | Status: DC | PRN

## 2022-09-04 MED ORDER — BUPIVACAINE HCL (PF) 0.25 % IJ SOLN
INTRAMUSCULAR | Status: DC | PRN
  Administered 2022-09-04: 4 mL

## 2022-09-04 MED ORDER — LIDOCAINE 2% (20 MG/ML) 5 ML SYRINGE
INTRAMUSCULAR | Status: AC
  Filled 2022-09-04: qty 5

## 2022-09-04 MED ORDER — FENTANYL CITRATE (PF) 250 MCG/5ML IJ SOLN
INTRAMUSCULAR | Status: AC
  Filled 2022-09-04: qty 5

## 2022-09-04 MED ORDER — ONDANSETRON HCL 4 MG/2ML IJ SOLN
INTRAMUSCULAR | Status: AC
  Filled 2022-09-04: qty 2

## 2022-09-04 MED ORDER — FENTANYL CITRATE (PF) 250 MCG/5ML IJ SOLN
INTRAMUSCULAR | Status: DC | PRN
  Administered 2022-09-04: 75 ug via INTRAVENOUS
  Administered 2022-09-04: 25 ug via INTRAVENOUS

## 2022-09-04 MED ORDER — PROPOFOL 10 MG/ML IV BOLUS
INTRAVENOUS | Status: DC | PRN
  Administered 2022-09-04: 150 mg via INTRAVENOUS
  Administered 2022-09-04: 20 mg via INTRAVENOUS

## 2022-09-04 SURGICAL SUPPLY — 47 items
ATTRACTOMAT 16X20 MAGNETIC DRP (DRAPES) IMPLANT
BAG COUNTER SPONGE SURGICOUNT (BAG) ×1 IMPLANT
BLADE RAD40 ROTATE 4M 4 5PK (BLADE) IMPLANT
BLADE RAD60 ROTATE M4 4 5PK (BLADE) IMPLANT
BLADE SURG 15 STRL LF DISP TIS (BLADE) IMPLANT
BLADE SURG 15 STRL SS (BLADE)
BLADE TRICUT ROTATE M4 4 5PK (BLADE) ×1 IMPLANT
CANISTER SUCT 3000ML PPV (MISCELLANEOUS) ×2 IMPLANT
COAGULATOR SUCT SWTCH 10FR 6 (ELECTROSURGICAL) IMPLANT
DRAPE HALF SHEET 40X57 (DRAPES) IMPLANT
DRESSING NASAL KENNEDY 3.5X.9 (MISCELLANEOUS) IMPLANT
DRSG NASAL KENNEDY 3.5X.9 (MISCELLANEOUS)
ELECT REM PT RETURN 9FT ADLT (ELECTROSURGICAL)
ELECTRODE REM PT RTRN 9FT ADLT (ELECTROSURGICAL) ×1 IMPLANT
FILTER ARTHROSCOPY CONVERTOR (FILTER) ×1 IMPLANT
GAUZE PACKING IODOFORM 1/4X15 (PACKING) IMPLANT
GLOVE BIOGEL M 7.0 STRL (GLOVE) ×1 IMPLANT
GOWN STRL REUS W/ TWL LRG LVL3 (GOWN DISPOSABLE) ×2 IMPLANT
GOWN STRL REUS W/TWL LRG LVL3 (GOWN DISPOSABLE) ×2
IV CATH AUTO 14GX1.75 SAFE ORG (IV SOLUTION) IMPLANT
KIT BASIN OR (CUSTOM PROCEDURE TRAY) ×1 IMPLANT
KIT TURNOVER KIT B (KITS) ×1 IMPLANT
MARKER SKIN DUAL TIP RULER LAB (MISCELLANEOUS) ×1 IMPLANT
NDL 18GX1X1/2 (RX/OR ONLY) (NEEDLE) IMPLANT
NDL HYPO 25GX1X1/2 BEV (NEEDLE) IMPLANT
NEEDLE 18GX1X1/2 (RX/OR ONLY) (NEEDLE) IMPLANT
NEEDLE HYPO 25GX1X1/2 BEV (NEEDLE) ×1 IMPLANT
NS IRRIG 1000ML POUR BTL (IV SOLUTION) ×1 IMPLANT
PAD ARMBOARD 7.5X6 YLW CONV (MISCELLANEOUS) ×2 IMPLANT
PENCIL SMOKE EVACUATOR (MISCELLANEOUS) IMPLANT
SPECIMEN JAR SMALL (MISCELLANEOUS) ×1 IMPLANT
SPIKE FLUID TRANSFER (MISCELLANEOUS) ×1 IMPLANT
SPONGE GAUZE 2X2 8PLY STRL LF (GAUZE/BANDAGES/DRESSINGS) IMPLANT
SPONGE NEURO XRAY DETECT 1X3 (DISPOSABLE) ×1 IMPLANT
SUT CHROMIC 5 0 P 3 (SUTURE) IMPLANT
SUT ETHILON 3 0 FSL (SUTURE) IMPLANT
SUT PLAIN 4 0 ~~LOC~~ 1 (SUTURE) IMPLANT
SWAB COLLECTION DEVICE MRSA (MISCELLANEOUS) IMPLANT
SWAB CULTURE ESWAB REG 1ML (MISCELLANEOUS) IMPLANT
SYR BULB EAR ULCER 3OZ GRN STR (SYRINGE) IMPLANT
SYR CONTROL 10ML LL (SYRINGE) ×1 IMPLANT
TOWEL GREEN STERILE FF (TOWEL DISPOSABLE) ×1 IMPLANT
TRAY ENT MC OR (CUSTOM PROCEDURE TRAY) ×1 IMPLANT
TUBE CONNECTING 12X1/4 (SUCTIONS) ×1 IMPLANT
TUBE SALEM SUMP 16 FR W/ARV (TUBING) IMPLANT
TUBING EXTENTION W/L.L. (IV SETS) ×1 IMPLANT
WATER STERILE IRR 1000ML POUR (IV SOLUTION) ×1 IMPLANT

## 2022-09-04 NOTE — Progress Notes (Signed)
ENT PROGRESS NOTE  ID: 71 y/o F with hx of right right nasal abscess PPD#1 s/p incision and drainage  S: Afebrile. Pain well controlled Cultures growing Gram + cocci in clusters On unasyn/Vanc  O: Purulent drainage from abscess incision and drainage sites (internal/external); tender to palpation  A/P: PPD#1 bedside I&D with ongoing infection.   OCTOR for nasal washout; verbal attestation consent obtained for I&D/washout right nasal abscess under GA.   Continue IV abx F/u cultures - suspect MRSA  Electronically signed by:  Jenetta Downer, MD  Staff Physician Facial Plastic & Reconstructive Surgery Otolaryngology - Head and Neck Surgery [place], [place]

## 2022-09-04 NOTE — Anesthesia Procedure Notes (Signed)
Procedure Name: Intubation Date/Time: 09/04/2022 9:00 AM  Performed by: Betha Loa, CRNAPre-anesthesia Checklist: Patient identified, Emergency Drugs available, Suction available and Patient being monitored Patient Re-evaluated:Patient Re-evaluated prior to induction Oxygen Delivery Method: Circle System Utilized Preoxygenation: Pre-oxygenation with 100% oxygen Induction Type: IV induction Ventilation: Mask ventilation without difficulty Laryngoscope Size: Mac and 4 Grade View: Grade I Tube type: Oral Tube size: 7.0 mm Number of attempts: 1 Airway Equipment and Method: Stylet and Oral airway Placement Confirmation: ETT inserted through vocal cords under direct vision, positive ETCO2 and breath sounds checked- equal and bilateral Secured at: 21 cm Tube secured with: Tape Dental Injury: Teeth and Oropharynx as per pre-operative assessment

## 2022-09-04 NOTE — Op Note (Addendum)
OPERATIVE NOTE  [name] Date/Time of Admission: 08/30/2022  2:50 PM  CSN: [id number];IDP:[id number] Attending Provider: Velna Ochs, MD Room/Bed: MCPO/NONE DOB: Dec 04, 1950 Age: 71 y.o.   Pre-Op Diagnosis: Nasal Infection  Post-Op Diagnosis: Nasal Infection  Procedure: Procedure(s): IRRIGATION AND DEBRIDEMENT OF NASAL WOUND (RIGHT)  Anesthesia: General  Surgeon(s): Pamala Hurry, MD  Staff: Circulator: Ellender Hose, RN Scrub Person: Tildon Husky, RN  Implants: * No implants in log *  Specimens: ID Type Source Tests Collected by Time Destination  A : Right Nasal Pus Body Fluid PATH Other AEROBIC/ANAEROBIC CULTURE W GRAM STAIN (SURGICAL/DEEP WOUND) Jenetta Downer, MD 44/31/5400 [id number]     Complications: none  EBL: 10 ML  IVF: Per anesthesia report  Condition: stable  Operative Findings:  Necrotic nasal alar lobule from abscess - ~1cm of debrided nasal vestibular lining and dehiscence of nasal ala. Repaired with 5-0 chromic.  Wound cavity copiously irrigated with 100 mL of saline. All abscess loculations interrogated to create common cavity. ~12 inches of 1/4 inch strip gauze packing placed.   Indications for procedure: [name] is a 71 year old type II diabetic female who was admitted on October 23 for nasal cellulitis/nasal phlegmon and treated with IV antibiotics.  I was consulted yesterday October 27 for nasal abscess identified on MRI and perform bedside incision and drainage. Cultures from bedside procedure growing gram + cocci in clusters concerning for MRSA. Due to ongoing purulent drainage during bedside rounds this morning I did recommend definitive surgical management with OR washout.  Patient presents now for definitive surgical management.  Informed consent was obtained  Description of Operation:  The patient was identified in the preoperative area and consent from the chart.  She was brought to the operating by the  anesthetist.  Franne Grip was performed confirming patient identity and procedure to be performed.  Once all were in agreement we proceed with surgery.  General seizure just the patient was intubated oral endotracheal tube.  Patient's nose was examined with the findings noted above.  She was prepped and draped in standard fashion for procedure described.  Final preoperative pause was performed we will proceed with surgery.  To begin mosquito dissector was used to dissect into the abscess cavity through my prior incision and drainage sites.  During this time it was noted that there was ulcerated and necrotic nasal lining tissue and the ala became dehiscent.  Several more milliliters of frank purulent fluid egressed from the nasal cavity.  Culture was again sent.  This allowed for aggressive surgical debridement and irrigation for this very aggressive infection.  Mosquito was used to break up loculations and the multiloculated abscess.  This was taken down to the face of the piriform aperture and anterior wall of the maxilla.  Next the wound was copiously irrigated with approximately 100 mL of sterile saline.  The ala was re-attached with interrupted 5-0 chromic gut suture. The wound (via nasal vestibular lining) was packed with quarter inch strip gauze packing and dressed with tape and gauze.  Patient was directed anesthetist extubated and brought to the recovery room in stable condition.  Plan: Back out 50% of strip gauze POD#1, remainder POD#2 Continue IV abx per primary team F/u cultures   Pamala Hurry, MD [facility] ENT  09/04/2022

## 2022-09-04 NOTE — Progress Notes (Addendum)
HD#4 Subjective:   Summary: [name] is a 71 yo F with past medical history of gold E COPD with recent admission for exacerbation, HTN, T2DM, HLD, breast cancer who was directly admitted from the clinic for facial pain and swelling.   Overnight Events: No acute events overnight  Patient states she feels better today.  She states that she is a little bit thirsty and would like some ginger ale.  She is aware that she has pain medicine if she needs it.  Expresses gratitude for the care she has received thus far.  Denies any shortness of breath or difficulty breathing.  Objective:  Vital signs in last 24 hours:    09/04/2022   10:15 AM 09/04/2022   10:00 AM 09/04/2022    9:45 AM  Vitals with BMI  Systolic 784 696 295  Diastolic 74 71 66  Pulse 86 88 78    Supplemental O2: Room Air SpO2: 97 %   Physical Exam:  Constitutional: well-appearing elderly female sitting in hospital bed, in no acute distress HENT: Gauze taped across right nose after recent irrigation and debridement. Pulmonary/Chest: normal work of breathing on room air Neurological: alert & oriented x 3 Psych: Normal mood and affect, sedation still wearing off, patient a little drowsy  Filed Weights   08/31/22 0053 09/04/22 0821  Weight: 79 kg 79 kg     Intake/Output Summary (Last 24 hours) at 09/04/2022 1234 Last data filed at 09/04/2022 2841 Gross per 24 hour  Intake 900 ml  Output --  Net 900 ml   Net IO Since Admission: 3,094.28 mL [09/04/22 1234]  Pertinent Labs:    Latest Ref Rng & Units 09/04/2022    2:28 AM 09/03/2022    4:15 AM 09/02/2022    3:38 AM  CBC  WBC 4.0 - 10.5 K/uL 12.9  12.6  14.0   Hemoglobin 12.0 - 15.0 g/dL 11.1  11.4  12.1   Hematocrit 36.0 - 46.0 % 31.8  34.3  35.7   Platelets 150 - 400 K/uL 278  287  338        Latest Ref Rng & Units 09/04/2022    2:28 AM 09/03/2022    4:15 AM 08/31/2022    3:21 AM  CMP  Glucose 70 - 99 mg/dL 154  157  141   BUN 8 - 23 mg/dL  '12  8  9   '$ Creatinine 0.44 - 1.00 mg/dL 0.75  0.65  0.92   Sodium 135 - 145 mmol/L 130  133  135   Potassium 3.5 - 5.1 mmol/L 3.9  3.5  4.5   Chloride 98 - 111 mmol/L 95  97  98   CO2 22 - 32 mmol/L '26  26  27   '$ Calcium 8.9 - 10.3 mg/dL 8.1  8.4  8.9     Imaging: No results found.  Assessment/Plan:   Principal Problem:   Facial cellulitis Active Problems:   Chronic obstructive pulmonary disease with bronchospasm (HCC)   Chronic sinusitis   Type 2 diabetes mellitus with complication, without long-term current use of insulin (HCC)   Anxiety   Facial edema   Patient Summary: [name] is a 71 y.o. female with PMH COPD, HTN, T2DM, HLD, breast cancer who presented with facial swelling and is admitted for right nose cellulitis and bilateral maxillary sinusitis.   #Facial cellulitis complicated by nasal abscess #Bilateral maxillary sinusitis Patient with cellulitis at right nose otherwise clinically stable.  Still with mild leukocytosis,  though patient is on prednisone which we are tapering.  She remains afebrile.  We will continue on Unasyn and vancomycin IV.  MRI demonstrated to abscesses, one at right nare and 1 at pre-malar tissue.  ENT consulted and performed I&D yesterday.  Cultures showing gram-positive cocci in clusters, will continue to follow.  Patient taken to the OR today for irrigation and debridement.  Seen at bedside afterward and patient is recovering well. -ENT following, appreciate assistance - IV Unasyn + vancomycin -tylenol 650 q6 PRN, oxycodone 5 mg every 6 hours as needed - Trend CBC  Incidental CT finding of corpus callosum mass Patient with incidental finding on CT maxillofacial described as 6 x 6 mm at the genu of corpus callosum.  Follow-up MRI brain demonstrated anatomical variant with no need for further work-up.   #COPD Clinically stable.  Sating well on room air. No concerns for exacerbation at this time. - Anoro Ellipta daily, albuterol nebs  every 4 as needed - DuoNebs 3 times daily, scheduled - Prednisone 10 mg daily with slow taper - Mucinex twice daily as needed for cough   #HTN Normotensive.  She is now on her home regimen.  We will continue to monitor but expect to improve as we address the pain related to her facial cellulitis. -amlodipine 10 -HCTZ 25 -metoprolol tartrate 25 twice daily   #T2DM A1c 5.7 in July.  Repeat A1c 6.5 this admission.  Fasting 135 today. - Continue SSI -Continue prednisone taper -Continue Lyrica 100 mg 3 times daily, Cymbalta 60 mg daily - CBGs ACHS given her long course of steroids   #HLD Continue Zocor 40 mg daily  Diet: Carb-Modified VTE: Enoxaparin IVF: None,None Code: Full   Prior to Admission Living Arrangement: Home, living with husband Anticipated Discharge Location: Home Barriers to Discharge: medical management and improvement of cellulitis   Dispo: Admit patient to Observation with expected length of stay less than 2 midnights.  Linward Natal MD Internal Medicine Resident PGY-1 Please contact the on call pager after 5 pm and on weekends at [phone].

## 2022-09-04 NOTE — Transfer of Care (Signed)
Immediate Anesthesia Transfer of Care Note  Patient: [name]  Procedure(s) Performed: IRRIGATION AND DEBRIDEMENT OF NASAL WOUND (Right: Nose)  Patient Location: PACU  Anesthesia Type:General  Level of Consciousness: awake, alert , patient cooperative, and responds to stimulation  Airway & Oxygen Therapy: Patient Spontanous Breathing  Post-op Assessment: Report given to RN and Post -op Vital signs reviewed and stable  Post vital signs: Reviewed and stable  Last Vitals:  Vitals Value Taken Time  BP 127/74 09/04/22 0930  Temp    Pulse 95 09/04/22 0935  Resp 17 09/04/22 0935  SpO2 99 % 09/04/22 0935  Vitals shown include unvalidated device data.  Last Pain:  Vitals:   09/04/22 0803  TempSrc: Oral  PainSc:       Patients Stated Pain Goal: 0 (24/19/91 4445)  Complications: No notable events documented.

## 2022-09-04 NOTE — Anesthesia Preprocedure Evaluation (Addendum)
Anesthesia Evaluation  Patient identified by MRN, date of birth, ID band Patient awake    Reviewed: Allergy & Precautions, NPO status , Patient's Chart, lab work & pertinent test results  Airway Mallampati: II  TM Distance: >3 FB Neck ROM: Full    Dental  (+) Edentulous Upper, Edentulous Lower   Pulmonary asthma , COPD,  COPD inhaler, Current Smoker and Patient abstained from smoking.,    Pulmonary exam normal        Cardiovascular hypertension, Pt. on medications and Pt. on home beta blockers  Rhythm:Regular Rate:Normal     Neuro/Psych  Headaches, Anxiety Depression    GI/Hepatic Neg liver ROS, GERD  ,  Endo/Other  diabetes, Type 2, Insulin Dependent  Renal/GU negative Renal ROS  negative genitourinary   Musculoskeletal  (+) Arthritis , Facial cellulitis with nasal abscess   Abdominal Normal abdominal exam  (+)   Peds  Hematology Lab Results      Component                Value               Date                      WBC                      12.9 (H)            09/04/2022                HGB                      11.1 (L)            09/04/2022                HCT                      31.8 (L)            09/04/2022                MCV                      91.6                09/04/2022                PLT                      278                 09/04/2022           Lab Results      Component                Value               Date                      NA                       130 (L)             09/04/2022                K  3.9                 09/04/2022                CO2                      26                  09/04/2022                GLUCOSE                  154 (H)             09/04/2022                BUN                      12                  09/04/2022                CREATININE               0.75                09/04/2022                CALCIUM                  8.1 (L)              09/04/2022                GFRNONAA                 >60                 09/04/2022             Anesthesia Other Findings   Reproductive/Obstetrics                            Anesthesia Physical Anesthesia Plan  ASA: 3  Anesthesia Plan: General   Post-op Pain Management:    Induction: Intravenous  PONV Risk Score and Plan: 2 and Ondansetron, Dexamethasone and Treatment may vary due to age or medical condition  Airway Management Planned: Mask and Oral ETT  Additional Equipment: None  Intra-op Plan:   Post-operative Plan: Extubation in OR  Informed Consent: I have reviewed the patients History and Physical, chart, labs and discussed the procedure including the risks, benefits and alternatives for the proposed anesthesia with the patient or authorized representative who has indicated his/her understanding and acceptance.     Dental advisory given  Plan Discussed with: CRNA  Anesthesia Plan Comments:        Anesthesia Quick Evaluation

## 2022-09-04 NOTE — Anesthesia Postprocedure Evaluation (Signed)
Anesthesia Post Note  Patient: [name]  Procedure(s) Performed: IRRIGATION AND DEBRIDEMENT OF NASAL WOUND (Right: Nose)     Patient location during evaluation: PACU Anesthesia Type: General Level of consciousness: awake and alert Pain management: pain level controlled Vital Signs Assessment: post-procedure vital signs reviewed and stable Respiratory status: spontaneous breathing, nonlabored ventilation, respiratory function stable and patient connected to nasal cannula oxygen Cardiovascular status: blood pressure returned to baseline and stable Postop Assessment: no apparent nausea or vomiting Anesthetic complications: no   No notable events documented.  Last Vitals:  Vitals:   09/04/22 1000 09/04/22 1015  BP: 123/71 113/74  Pulse: 88 86  Resp: 18 18  Temp: 36.6 C   SpO2: 95% 97%    Last Pain:  Vitals:   09/04/22 1015  TempSrc:   PainSc: 0-No pain                 Belenda Cruise P Charise Leinbach

## 2022-09-05 DIAGNOSIS — L03211 Cellulitis of face: Secondary | ICD-10-CM | POA: Diagnosis not present

## 2022-09-05 LAB — CBC WITH DIFFERENTIAL/PLATELET
Abs Immature Granulocytes: 0.07 K/uL (ref 0.00–0.07)
Basophils Absolute: 0 K/uL (ref 0.0–0.1)
Basophils Relative: 0 %
Eosinophils Absolute: 0 K/uL (ref 0.0–0.5)
Eosinophils Relative: 0 %
HCT: 30.6 % — ABNORMAL LOW (ref 36.0–46.0)
Hemoglobin: 10.2 g/dL — ABNORMAL LOW (ref 12.0–15.0)
Immature Granulocytes: 1 %
Lymphocytes Relative: 10 %
Lymphs Abs: 1 K/uL (ref 0.7–4.0)
MCH: 31 pg (ref 26.0–34.0)
MCHC: 33.3 g/dL (ref 30.0–36.0)
MCV: 93 fL (ref 80.0–100.0)
Monocytes Absolute: 0.4 K/uL (ref 0.1–1.0)
Monocytes Relative: 4 %
Neutro Abs: 8.4 K/uL — ABNORMAL HIGH (ref 1.7–7.7)
Neutrophils Relative %: 85 %
Platelets: 267 K/uL (ref 150–400)
RBC: 3.29 MIL/uL — ABNORMAL LOW (ref 3.87–5.11)
RDW: 12.4 % (ref 11.5–15.5)
WBC: 9.9 K/uL (ref 4.0–10.5)
nRBC: 0 % (ref 0.0–0.2)

## 2022-09-05 LAB — CBC
HCT: 31.5 % — ABNORMAL LOW (ref 36.0–46.0)
Hemoglobin: 11 g/dL — ABNORMAL LOW (ref 12.0–15.0)
MCH: 32.4 pg (ref 26.0–34.0)
MCHC: 34.9 g/dL (ref 30.0–36.0)
MCV: 92.9 fL (ref 80.0–100.0)
Platelets: 286 K/uL (ref 150–400)
RBC: 3.39 MIL/uL — ABNORMAL LOW (ref 3.87–5.11)
RDW: 12.4 % (ref 11.5–15.5)
WBC: 14.5 K/uL — ABNORMAL HIGH (ref 4.0–10.5)
nRBC: 0 % (ref 0.0–0.2)

## 2022-09-05 LAB — GLUCOSE, CAPILLARY
Glucose-Capillary: 217 mg/dL — ABNORMAL HIGH (ref 70–99)
Glucose-Capillary: 196 mg/dL — ABNORMAL HIGH (ref 70–99)
Glucose-Capillary: 239 mg/dL — ABNORMAL HIGH (ref 70–99)
Glucose-Capillary: 200 mg/dL — ABNORMAL HIGH (ref 70–99)

## 2022-09-05 LAB — BASIC METABOLIC PANEL WITH GFR
Anion gap: 11 (ref 5–15)
BUN: 12 mg/dL (ref 8–23)
CO2: 25 mmol/L (ref 22–32)
Calcium: 8.2 mg/dL — ABNORMAL LOW (ref 8.9–10.3)
Chloride: 95 mmol/L — ABNORMAL LOW (ref 98–111)
Creatinine, Ser: 0.85 mg/dL (ref 0.44–1.00)
GFR, Estimated: >60 mL/min
Glucose, Bld: 266 mg/dL — ABNORMAL HIGH (ref 70–99)
Potassium: 4.2 mmol/L (ref 3.5–5.1)
Sodium: 131 mmol/L — ABNORMAL LOW (ref 135–145)

## 2022-09-05 LAB — BASIC METABOLIC PANEL
Anion gap: 12 (ref 5–15)
BUN: 12 mg/dL (ref 8–23)
CO2: 25 mmol/L (ref 22–32)
Calcium: 8.5 mg/dL — ABNORMAL LOW (ref 8.9–10.3)
Chloride: 95 mmol/L — ABNORMAL LOW (ref 98–111)
Creatinine, Ser: 0.75 mg/dL (ref 0.44–1.00)
GFR, Estimated: >60 mL/min (ref >60)
Glucose, Bld: 210 mg/dL — ABNORMAL HIGH (ref 70–99)
Potassium: 4.2 mmol/L (ref 3.5–5.1)
Sodium: 132 mmol/L — ABNORMAL LOW (ref 135–145)

## 2022-09-05 MED ORDER — IPRATROPIUM-ALBUTEROL 0.5-2.5 (3) MG/3ML IN SOLN: 3.0000 mL | RESPIRATORY_TRACT | Status: DC | PRN

## 2022-09-05 MED ORDER — FENTANYL CITRATE PF 50 MCG/ML IJ SOSY
50.0000 ug | PREFILLED_SYRINGE | Freq: Once | INTRAMUSCULAR | Status: DC | PRN
  Filled 2022-09-05: qty 1

## 2022-09-05 NOTE — Progress Notes (Signed)
HD#5 SUBJECTIVE:  Patient Summary: [name] is a 71 y.o. with a pertinent PMH of gold E COPD with recent admission for exacerbation, HTN, T2DM, HLD, breast cancer who was directly admitted from the clinic for facial pain and swelling found to have R nare abscess, now POD1 from washout of a necrotic nasal abscess.  Overnight Events: None  Interim History: [name] feels well today and is thankful that she had the abscess washout with ENT yesterday. She accidentally pulled out her nasal packing yesterday while scratching her nose but has not had recurrent bleeding or drainage.   OBJECTIVE:  Vital Signs: Vitals:   09/04/22 1015 09/04/22 1617 09/04/22 2149 09/05/22 0609  BP: 113/74 (!) 96/59 (!) 134/59 (!) 133/97  Pulse: 86 84 91 90  Resp: '18 17 16 17  '$ Temp:  98 F (36.7 C) 97.8 F (36.6 C) 98.4 F (36.9 C)  TempSrc:   Oral Oral  SpO2: 97% 98% 100% 100%  Weight:      Height:       Supplemental O2: Room Air SpO2: 100 %  Filed Weights   08/31/22 0053 09/04/22 0821  Weight: 79 kg 79 kg    No intake or output data in the 24 hours ending 09/05/22 1126 Net IO Since Admission: 3,094.28 mL [09/05/22 1126]  Physical Exam: Constitutional:Patient seems more uplifted, well. In no acute distress. HENT:Non-draining, non-bleeding scab over right nasal ala. No significant edema or erythema.  Cardio:Regular rate and rhythm.  Pulm:Normal work of breathing on room air. Neuro:Alert and oriented x3. No focal deficit noted. Psych:Pleasant mood and affect.  Patient Lines/Drains/Airways Status     Active Line/Drains/Airways     Name Placement date Placement time Site Days   Peripheral IV 11/03/21 20 G Left Antecubital 11/03/21  1355  Antecubital  306   Peripheral IV 09/03/22 22 G 1.75" Left;Anterior Forearm 09/03/22  1338  Forearm  2   Peripheral IV 09/04/22 20 G Left Hand 09/04/22  0836  Hand  1   Incision (Closed) 09/04/22 Nose Right 09/04/22  7628  -- 1              ASSESSMENT/PLAN:  Assessment: Principal Problem:   Facial cellulitis Active Problems:   Chronic obstructive pulmonary disease with bronchospasm (HCC)   Chronic sinusitis   Type 2 diabetes mellitus with complication, without long-term current use of insulin (HCC)   Anxiety   Facial edema   Plan: #Facial cellulitis complicated by nasal abscess #Bilateral maxillary sinusitis POD 1 from washout of R nare necrotic abscess. She is not having pain or drainage at this time. Wound culture shows moderate staph aureus. She remains on unasyn and vancomycin. Leukocytosis has resolved and she has remained afebrile. -ENT following, appreciate assistance  -Recommend antibiotic therapy 10-14 days after washout (10/28)  -Will consult ID for recommendations for antibiotic duration -Continue unasyn, vancomycin -Pain management with tylenol 650 q6h PRN, oxycodone 5 mg q6h PRN -Trend CBC   #COPD Clinically stable. No concerns for exacerbation at this time. -Anoro Ellipta daily, albuterol nebs q4h PRN -DuoNebs TID, scheduled -Prednisone 10 mg daily, slowly tapering -Mucinex BID PRN cough   #HTN Stable on home regimen. -Continue home amlodipine 10 mg daily -Continue home HCTZ 25 mg daily -Continue home metoprolol tartrate 25 mg BID   #T2DM HbA1c 6.5 this admission.  CBG have ranged 196-266 over previous 24h. -Continue SSI -Continue prednisone taper -Continue Lyrica 100 mg 3 times daily, Cymbalta 60 mg daily -CBGs ACHS given her  long course of steroids   #HLD -Continue home Zocor 40 mg daily  #Incidental CT finding of corpus callosum mass Patient with incidental finding on CT maxillofacial described as 6 x 6 mm at the genu of corpus callosum.  Follow-up MRI brain demonstrated anatomical variant with no need for further work-up.  Best Practice: Diet: Diabetic diet IVF: Fluids: None VTE: enoxaparin (LOVENOX) injection 40 mg Start: 08/30/22 2200 Code: Full AB: Unasyn,  vancomycin DISPO: Anticipated discharge pending IV antibiotics.  Signature: Farrel Gordon, D.O.  Internal Medicine Resident, PGY-1 [name]  Pager: [phone] 11:26 AM, 09/05/2022   Please contact the on call pager after 5 pm and on weekends at [phone].

## 2022-09-05 NOTE — Progress Notes (Signed)
ENT PROGRESS NOTE  ID: 71 y/o F with hx of T2DM and right right nasal abscess PPD#2 bedside s/p incision and drainage, POD#1 (09/04/22) s/p wound washout under general anesthesia  S: Afebrile. Pain well controlled Cultures growing Gram + cocci in clusters On unasyn/Vanc Nursing reported that patient pulled out nasal packing overnight. No packing remains on AM rounds.   O:  Resting comfortably in NAD Right nasal ala with significant improvement in swelling/edema/erythema. No purulent drainage this morning. Tenderness is mild to manipulation. Chromic sutures absent as well as packing (patient pulled it all out). Nasal alar skin approximated. Nasal vestibular wound with fresh blood and granulation tissue. Cue tip probes ~1cm.   A/P: POD#1 s/p wound washout of right nasal abscess 2/2 nasal vestibulitis, with significant improvement. Wound cultures growing GPC Clusters, suspect MRSA.   No further wound packing F/u cultures - await discharge until cultures final  Please consult infectious disease to assist in duration of abx therapy (I would recommend at least 10-14 days post-operatively given severity of infection)  Mupirocin ointment TID x 14 days bilateral nares ENT to follow peripherally; please notify once discharged from [facility] and I will arrange a post-operative visit at [facility] ENT [phone]  Electronically signed by:  Jenetta Downer, MD  Staff Physician Facial Plastic & Reconstructive Surgery Otolaryngology - Head and [place], [place]

## 2022-09-05 NOTE — Progress Notes (Signed)
Pharmacy Antibiotic Note  [name] is a 71 y.o. female admitted on 08/30/2022 with facial cellulitis.  Pharmacy has been consulted for vancomycin & unasyn dosing.  Plan: Continue Vancomycin 750 mg IV q 12 hrs (eAUC 483.7, Scr 0.8, Vd 0.72) Continue Unasyn 3 g IV q 8 hours Follow up on length of treatment Follow up on sensitivities Monitor renal function, clinical improvement  Height: 5' 5.98" (167.6 cm) Weight: 79 kg (174 lb 3.2 oz) IBW/kg (Calculated) : 59.26  Temp (24hrs), Avg:98.1 F (36.7 C), Min:97.8 F (36.6 C), Max:98.4 F (36.9 C)  Recent Labs  Lab 08/31/22 0321 09/01/22 0533 09/02/22 0338 09/03/22 0415 09/04/22 0228 09/05/22 0218 09/05/22 1210  WBC 13.2*   < > 14.0* 12.6* 12.9* 9.9 14.5*  CREATININE 0.92  --   --  0.65 0.75 0.85 0.75   < > = values in this interval not displayed.    Estimated Creatinine Clearance: 68.4 mL/min (by C-G formula based on SCr of 0.75 mg/dL).    Allergies  Allergen Reactions   Ace Inhibitors Swelling    Throat swelling. Lisinopril-HCTZ Pt states she in not allergic     Antimicrobials this admission: Unasyn 10/24 >>  Vancomycin 10/26 >>   Dose adjustments this admission: No dose adjustments required this admission. Per Dr. Marlou Sa note (10/29), antibiotic therapy will last 10-14 days, starting from 10/28.   Microbiology results: 10/29 Wound Cx: rare Staph Aureus 10/26 MRSA PCR: Not Detected  Thank you for allowing pharmacy to be a part of this patient's care.  [place] Intern 09/05/2022 1:08 PM

## 2022-09-05 NOTE — Consult Note (Signed)
[facility]       Reason for Consult:facial abscess    Referring Physician: Dr. Philipp Ovens  Principal Problem:   Facial cellulitis Active Problems:   Chronic obstructive pulmonary disease with bronchospasm (HCC)   Chronic sinusitis   Type 2 diabetes mellitus with complication, without long-term current use of insulin (HCC)   Anxiety   Facial edema    amLODipine  10 mg Oral Daily   atorvastatin  20 mg Oral q1800   DULoxetine  60 mg Oral Daily   enoxaparin (LOVENOX) injection  40 mg Subcutaneous Q24H   fentaNYL (SUBLIMAZE) injection  50 mcg Intravenous Once   hydrochlorothiazide  25 mg Oral Daily   insulin aspart  0-9 Units Subcutaneous TID WC   ipratropium-albuterol  3 mL Nebulization TID   losartan  100 mg Oral Daily   melatonin  3 mg Oral QHS   metoprolol tartrate  25 mg Oral BID   mupirocin ointment   Topical TID   predniSONE  10 mg Oral Q breakfast   pregabalin  100 mg Oral TID   umeclidinium-vilanterol  1 puff Inhalation Daily    Recommendations: Continue vancomycin Will stop ampicillin/sulbactam Will determine appropriate oral treatment once sensitivities resulted Duration depends on resolution of the infection clinically; could do 10 days at discharge  Assessment: Nasal abscess with Staph aureus.    Antibiotics: Vancomycin + ampicillin/sulbactam  HPI: [name] is a 71 y.o. female with a history of COPD, HTN, DM, breast cancer and came in from the IM clinic with facial pain and swelling.  She was taken to the OR by Dr. Sabino Gasser of ENT for debridement of the right nares.  Udnerwent bedside I and D 10/27 then operative I and D 10/28.  Culture from 10/28 growing Staph aureus with sensitivities pending. No fever, no leukocytosis.  Day 6 total antibiotics.  Day 4 vancomcyin, added due to leukocytosis. Consult request for antibiotic duration.  She continues on oral prednisone for COPD.  She mainly is concerned by the right nare cut  and asking about getting it fixed before discharge.    Review of Systems:  Constitutional: negative for fevers and chills Gastrointestinal: negative for diarrhea All other systems reviewed and are negative    Past Medical History:  Diagnosis Date   Acute sinusitis 01/09/2019   Anxiety    Arthritis    "fingers, knees" (08/16/2018)   Asthma    Atopic dermatitis 03/20/2018   Axillary hidradenitis suppurativa 10/13/2018   Cancer of right breast ([place]) 1991   s/p lumpectomy, chemotherapy and radiation therapy in 1991. Mammogram in 2007 was normal.   Constipated    h/o   COPD (chronic obstructive pulmonary disease) (HCC)    History of multiple hospital admissions for exercabation    COPD exacerbation ([place]) 01/01/2019   COPD with acute exacerbation ([place]) 01/14/2019   COPD with exacerbation ([place]) 04/06/2009   Qualifier: Diagnosis of  By: Eyvonne Mechanic MD, Vijay     Depression    Diarrhea    h/o   GERD (gastroesophageal reflux disease)    Headache    "a few times/month" (08/16/2018   Heart murmur 10/05/11   "first time I ever heard I had one was today"   History of breast cancer 12/01/2012   Pt with h/o breast CA s/p lumpectomy with chemo/radiation in 1991. Pt mammogram 2011 was unremarkable. Had CT chest 10/2012 in ED for SOB and showed spiculated nodule with lymph node. 12/04/12: Birads 2; repeat  diagnostic mammogram in 1 year.     Hyperlipidemia    Hypertension    Lower extremity edema 09/21/2018   Obesity    Personal history of chemotherapy    Personal history of radiation therapy    Pneumonia    "couple times in the last 10-15 yrs" (08/16/2018)   QT prolongation 08/08/2014   Seasonal allergies 02/25/2017   Shortness of breath 10/05/11   "at rest; lying down; w/exertion"   Sigmoid diverticulitis 80/2008   Tobacco abuse    Type 2 diabetes mellitus ([place]) 05/14/2009   Type II diabetes mellitus ([place])     Social History   Tobacco Use   Smoking status: Every Day    Packs/day: 0.50    Years:  45.00    Total pack years: 22.50    Types: Cigarettes   Smokeless tobacco: Never   Tobacco comments:    1/3/pk every 2-3 days   Vaping Use   Vaping Use: Never used  Substance Use Topics   Alcohol use: Yes    Alcohol/week: 4.0 standard drinks of alcohol    Types: 4 Cans of beer per week    Comment: 08/16/2018 "weekends only"   Drug use: No    Family History  Problem Relation Age of Onset   Cancer Mother    Breast cancer Daughter 66    Allergies  Allergen Reactions   Ace Inhibitors Swelling    Throat swelling. Lisinopril-HCTZ Pt states she in not allergic     Physical Exam: Constitutional: in no apparent distress  Vitals:   09/04/22 2149 09/05/22 0609  BP: (!) 134/59 (!) 133/97  Pulse: 91 90  Resp: 16 17  Temp: 97.8 F (36.6 C) 98.4 F (36.9 C)  SpO2: 100% 100%   EYES: anicteric ENMT: + right nasal cut Respiratory: normal respiratory effort  Lab Results  Component Value Date   WBC 14.5 (H) 09/05/2022   HGB 11.0 (L) 09/05/2022   HCT 31.5 (L) 09/05/2022   MCV 92.9 09/05/2022   PLT 286 09/05/2022    Lab Results  Component Value Date   CREATININE 0.75 09/05/2022   BUN 12 09/05/2022   NA 132 (L) 09/05/2022   K 4.2 09/05/2022   CL 95 (L) 09/05/2022   CO2 25 09/05/2022    Lab Results  Component Value Date   ALT 12 08/15/2022   AST 21 08/15/2022   ALKPHOS 95 08/15/2022     Microbiology: Recent Results (from the past 240 hour(s))  MRSA Next Gen by PCR, Nasal     Status: None   Collection Time: 09/02/22 10:33 AM   Specimen: Nasal Mucosa; Nasal Swab  Result Value Ref Range Status   MRSA by PCR Next Gen NOT DETECTED NOT DETECTED Final    Comment: (NOTE) The GeneXpert MRSA Assay (FDA approved for NASAL specimens only), is one component of a comprehensive MRSA colonization surveillance program. It is not intended to diagnose MRSA infection nor to guide or monitor treatment for MRSA infections. Test performance is not FDA approved in patients less than  68 years old. Performed at [facility], [place] [address] [place], [place] [address]   Aerobic/Anaerobic Culture w Gram Stain (surgical/deep wound)     Status: None (Preliminary result)   Collection Time: 09/04/22  3:17 AM   Specimen: Wound  Result Value Ref Range Status   Specimen Description WOUND  Final   Special Requests RIGHT NASAL  Final   Gram Stain   Final    FEW  WBC PRESENT,BOTH PMN AND MONONUCLEAR FEW GRAM POSITIVE COCCI IN CLUSTERS Performed at [facility], [place] [address], [place], [place] [address]    Culture RARE STAPHYLOCOCCUS AUREUS  Final   Report Status PENDING  Incomplete  Aerobic/Anaerobic Culture w Gram Stain (surgical/deep wound)     Status: None (Preliminary result)   Collection Time: 09/04/22  9:08 AM   Specimen: PATH Other; Body Fluid  Result Value Ref Range Status   Specimen Description NASAL SWAB RIGHT NASAL PUS  Final   Special Requests NONE  Final   Gram Stain   Final    FEW WBC PRESENT,BOTH PMN AND MONONUCLEAR FEW GRAM POSITIVE COCCI IN CLUSTERS Performed at [facility], [place] [address], [place], [place] [address]    Culture MODERATE STAPHYLOCOCCUS AUREUS  Final   Report Status PENDING  Incomplete    Thayer Headings, [facility] [email or web address] 09/05/2022, 3:50 PM

## 2022-09-06 ENCOUNTER — Other Ambulatory Visit (HOSPITAL_COMMUNITY): Payer: Self-pay

## 2022-09-06 ENCOUNTER — Encounter (HOSPITAL_COMMUNITY): Payer: Self-pay | Admitting: Otolaryngology

## 2022-09-06 ENCOUNTER — Encounter: Payer: Medicare Other | Admitting: Internal Medicine

## 2022-09-06 DIAGNOSIS — L03211 Cellulitis of face: Secondary | ICD-10-CM | POA: Diagnosis not present

## 2022-09-06 LAB — CBC
HCT: 33.1 % — ABNORMAL LOW (ref 36.0–46.0)
Hemoglobin: 11 g/dL — ABNORMAL LOW (ref 12.0–15.0)
MCH: 30.9 pg (ref 26.0–34.0)
MCHC: 33.2 g/dL (ref 30.0–36.0)
MCV: 93 fL (ref 80.0–100.0)
Platelets: 284 10*3/uL (ref 150–400)
RBC: 3.56 MIL/uL — ABNORMAL LOW (ref 3.87–5.11)
RDW: 12.4 % (ref 11.5–15.5)
WBC: 8.9 10*3/uL (ref 4.0–10.5)
nRBC: 0 % (ref 0.0–0.2)

## 2022-09-06 LAB — GLUCOSE, CAPILLARY
Glucose-Capillary: 201 mg/dL — ABNORMAL HIGH (ref 70–99)
Glucose-Capillary: 73 mg/dL (ref 70–99)

## 2022-09-06 LAB — CREATININE, SERUM
Creatinine, Ser: 0.78 mg/dL (ref 0.44–1.00)
GFR, Estimated: >60 mL/min (ref >60)

## 2022-09-06 MED ORDER — MUPIROCIN 2 % EX OINT
TOPICAL_OINTMENT | Freq: Three times a day (TID) | CUTANEOUS | 0 refills | Status: DC
  Filled 2022-09-06: qty 22, 30d supply, fill #0

## 2022-09-06 MED ORDER — DOXYCYCLINE HYCLATE 100 MG PO TABS
100.0000 mg | ORAL_TABLET | Freq: Two times a day (BID) | ORAL | 0 refills | Status: DC
  Filled 2022-09-06: qty 16, 8d supply, fill #0

## 2022-09-06 MED ORDER — CEFAZOLIN SODIUM-DEXTROSE 2-4 GM/100ML-% IV SOLN: 2.0000 g | Freq: Three times a day (TID) | INTRAVENOUS | Status: DC

## 2022-09-06 NOTE — Discharge Summary (Signed)
Name: [name] MRN: [id number] DOB: [birth year] 71 y.o. PCP: [name], MD  Date of Admission: 08/30/2022  2:50 PM Date of Discharge: 09/06/2022 Attending Physician: Tyson Alias, *  Discharge Diagnosis: 1. Principal Problem:   Facial cellulitis Active Problems:   Chronic obstructive pulmonary disease with bronchospasm (HCC)   Chronic sinusitis   Type 2 diabetes mellitus with complication, without long-term current use of insulin (HCC)   Anxiety   Facial edema  Discharge Medications: Allergies as of 09/06/2022       Reactions   Ace Inhibitors Swelling   Throat swelling. Lisinopril-HCTZ Pt states she in not allergic         Medication List     STOP taking these medications    amoxicillin-clavulanate 500-125 MG tablet Commonly known as: Augmentin   predniSONE 20 MG tablet Commonly known as: DELTASONE       TAKE these medications    acetaminophen 325 MG tablet Commonly known as: TYLENOL Take 2 tablets (650 mg total) by mouth every 6 (six) hours as needed for mild pain (or Fever >/= 101).   amLODipine 10 MG tablet Commonly known as: NORVASC Take 1 tablet (10 mg total) by mouth daily.   doxycycline 100 MG tablet Commonly known as: VIBRA-TABS Take 1 tablet (100 mg total) by mouth 2 (two) times daily.   DULoxetine 60 MG capsule Commonly known as: CYMBALTA Take 1 capsule (60 mg total) by mouth daily.   glucose blood test strip Commonly known as: OneTouch Verio Use to check blood sugar once daily. (E11.8)   hydrochlorothiazide 25 MG tablet Commonly known as: HYDRODIURIL Take 1 tablet (25 mg total) by mouth daily.   losartan 100 MG tablet Commonly known as: COZAAR Take 1 tablet (100 mg total) by mouth daily.   metoprolol tartrate 25 MG tablet Commonly known as: LOPRESSOR Take 1 tablet (25 mg total) by mouth 2 (two) times daily.   mupirocin ointment 2 % Commonly known as: BACTROBAN Apply topically 3 (three) times  daily.   naproxen 500 MG tablet Commonly known as: Naprosyn Take 1 tablet (500 mg total) by mouth 2 (two) times daily with a meal for 7 days.   OneTouch Delica Lancets 33G Misc Use to check blood sugar once daily (E11.8)   OneTouch Verio w/Device Kit Use to check blood sugar once daily. (E11.8)   pregabalin 100 MG capsule Commonly known as: LYRICA Take 100 mg by mouth 3 (three) times daily.   simvastatin 40 MG tablet Commonly known as: ZOCOR Take 1 tablet (40 mg total) by mouth every evening.   umeclidinium-vilanterol 62.5-25 MCG/ACT Aepb Commonly known as: ANORO ELLIPTA Inhale 1 puff into the lungs daily for 14 days.   Ventolin HFA 108 (90 Base) MCG/ACT inhaler Generic drug: albuterol Inhale 1-2 puffs into the lungs every 6 (six) hours as needed for wheezing or shortness of breath.   albuterol (2.5 MG/3ML) 0.083% nebulizer solution Commonly known as: PROVENTIL Take 3 mLs (2.5 mg total) by nebulization every 4 (four) hours as needed for up to 8 doses for wheezing or shortness of breath.               Discharge Care Instructions  (From admission, onward)           Start     Ordered   09/06/22 0000  Discharge wound care:       Comments: Apply bactroban ointment to the area three times daily for 12 additional days. Do not touch or  pick at the wound.   09/06/22 1358            Disposition and follow-up:   [name] was discharged from [facility] in [place] condition.  At the hospital follow up visit please address:  1.  Right nasal abscess 2/2 nasal vestibulitis: Patient underwent wound washout under general anesthesia.  She will be discharged with doxycycline 100 mg twice daily for 8 days to complete a total of 10 days antibiotics post source control.  She will also discharged with Bactroban ointment to apply 3 times daily for an additional 12 days.  At discharge, she has a small nonbleeding scab with some surrounding edema at her  right nare.  Please follow-up to ensure continued healing and resolution of her infection with continued antibiotics.  COPD: Patient was without exacerbation this admission.  Her prednisone from a prior exacerbation was tapered during her stay with last dose at 10 mg 10/30.  Please follow-up to ensure continued absence of cough or shortness of breath.  T2DM: A1c 6.5 this admission.  Sugars mildly elevated with prednisone though this will be discontinued at discharge.  Please follow to ensure patient does not require diabetes medications. She was not on these prior to admission.  2.  Labs / imaging needed at time of follow-up: CBC per provider discretion  3.  Pending labs/ test needing follow-up: None  Follow-up Appointments:  Follow-up Information     [name], MD. Schedule an appointment as soon as possible for a visit.   Why: Our clinic will call you to schedule a follow up appointment. Contact information: [address] [place] [address] [phone]                 Hospital Course by problem list: 1.  Right nasal abscess 2/2 nasal vestibulitis: Patient presented 10/23 after pain referred from clinic for right-sided facial cellulitis.  Patient has underlying chronic sinusitis and diabetes though well controlled.  Patient was begun on IV antibiotics with Unasyn given the component of sinusitis.  CT did not show underlying abscess initially or changes suggestive of osteomyelitis.  Patient was not septic, had no issues with her airway, and her symptoms did not seem to be odontogenic in origin.  Patient with only mild improvement on IV Unasyn HD 2, opted for repeat imaging which demonstrated formation of small abscesses.  Nasal swab negative for MRSA, however on exam discovered additional pustular component and added IV vancomycin.  Consulted ENT who performed I&D with culture.  Continued with MRSA coverage and soap and water washes with Bactroban.  Patient proceeded to  washout under general anesthesia 10/28 and left with wound packing.  Wound packing came out following day, cultures with GPC in clusters, suspicious for MRSA.  Patient continued on vancomycin but Unasyn discontinued.  Patient POD #2 on day of discharge, wound healing well afebrile without leukocytosis.  ENT to arrange follow-up.  Will discharge with 8 days of doxycycline to complete 10-day course post source control.  We will also leave with mupirocin ointment 3 times daily for 12 days to bilateral nares.  Discharge Exam:   BP (!) 133/97 (BP Location: Right Arm)   Pulse 90   Temp 98.4 F (36.9 C) (Oral)   Resp 17   Ht 5' 5.98" (1.676 m)   Wt 79 kg   SpO2 97%   BMI 28.13 kg/m  Discharge exam:  Constitutional:Patient seems more uplifted, well. In no acute distress. HENT: Non-draining, non-bleeding  scab over right nasal ala.  Edema improved, small incision healing.  Pulm:Normal work of breathing on room air. Neuro: Alert and oriented x3. No focal deficit noted. Psych: Pleasant mood and affect.  Pertinent Labs, Studies, and Procedures:     Latest Ref Rng & Units 09/06/2022    6:04 AM 09/05/2022   12:10 PM 09/05/2022    2:18 AM  CBC  WBC 4.0 - 10.5 K/uL 8.9  14.5  9.9   Hemoglobin 12.0 - 15.0 g/dL 65.7  84.6  96.2   Hematocrit 36.0 - 46.0 % 33.1  31.5  30.6   Platelets 150 - 400 K/uL 284  286  267       Latest Ref Rng & Units 09/06/2022    6:04 AM 09/05/2022   12:10 PM 09/05/2022    2:18 AM  CMP  Glucose 70 - 99 mg/dL  952  841   BUN 8 - 23 mg/dL  12  12   Creatinine 3.24 - 1.00 mg/dL 4.01  0.27  2.53   Sodium 135 - 145 mmol/L  132  131   Potassium 3.5 - 5.1 mmol/L  4.2  4.2   Chloride 98 - 111 mmol/L  95  95   CO2 22 - 32 mmol/L  25  25   Calcium 8.9 - 10.3 mg/dL  8.5  8.2    MR BRAIN W WO CONTRAST  Result Date: 09/02/2022 CLINICAL DATA:  Evaluate lesion at genu of corpus callosum, cellulitis of right nose EXAM: MRI HEAD WITHOUT AND WITH CONTRAST TECHNIQUE: Multiplanar,  multiecho pulse sequences of the brain and surrounding structures were obtained without and with intravenous contrast. CONTRAST:  8mL GADAVIST GADOBUTROL 1 MMOL/ML IV SOLN COMPARISON:  No prior MRI head, correlation is made with CT maxillofacial 08/30/2022 FINDINGS: Brain: No abnormal parenchymal or meningeal enhancement. The previously described lesion in the genu of the corpus callosum correlates with a slightly atypical appearance of a cavum septum pellucidum, a common anatomic variant. No restricted diffusion to suggest acute or subacute infarct. No acute hemorrhage, mass, mass effect, midline shift. No hemosiderin deposition to suggest remote hemorrhage. No hydrocephalus or extra-axial collection. T2 hyperintense signal in the periventricular Charice Zuno matter, likely the sequela of mild chronic small vessel ischemic disease. Vascular: Normal arterial flow voids. Skull and upper cervical spine: Normal marrow signal. Sinuses/Orbits: Air-fluid levels in the left-greater-than-right maxillary sinus. Mucosal thickening in the maxillary sinuses and ethmoid air cells. Orbits are unremarkable. Trace fluid in the mastoid air cells. Other: Peripherally enhancing collection in the soft tissues of the right nares. The larger collection measures 1.9 x 1.0 x 1.0 cm (AP x TR x CC) (series 22, image 6 and series 24, image 10). A smaller collection in the right pre malar soft tissues measures 1.3 x 1.1 x 1.4 cm (AP x TR x CC) (series 22, image 10 and series 24, image 9). Both collections are T1 hypointense, T2 hyperintense, and peripherally enhancing, most likely small abscesses. IMPRESSION: 1. No acute intracranial process. The previously described lesion in the genu of the corpus callosum correlates with a slightly atypical appearance of a cavum septum pellucidum, a common anatomic variant. 2. Peripherally enhancing collections in the soft tissues of the right nares and right pre malar soft tissues, most likely small abscesses. 3.  Air-fluid levels in the left-greater-than-right maxillary sinus, which can be seen in the setting of acute sinusitis. Electronically Signed   By: Wiliam Ke M.D.   On: 09/02/2022 23:48   CT MAXILLOFACIAL  W CONTRAST  Result Date: 08/30/2022 CLINICAL DATA:  Maxillary abscess EXAM: CT MAXILLOFACIAL WITH CONTRAST TECHNIQUE: Multidetector CT imaging of the maxillofacial structures was performed with intravenous contrast. Multiplanar CT image reconstructions were also generated. RADIATION DOSE REDUCTION: This exam was performed according to the departmental dose-optimization program which includes automated exposure control, adjustment of the mA and/or kV according to patient size and/or use of iterative reconstruction technique. CONTRAST:  80mL OMNIPAQUE IOHEXOL 350 MG/ML SOLN COMPARISON:  None Available. FINDINGS: Osseous: No fracture or mandibular dislocation. No destructive process. Carious dentition. Patient is edentulous along the maxillary arch. Orbits: Negative. No traumatic or inflammatory finding. There is inflammatory fat stranding in the preseptal infraorbital soft tissues on the right. No evidence of orbital cellulitis. Sinuses: There air-fluid levels in bilateral maxillary sinuses. There is mucosal thickening in bilateral ethmoid sinuses. Bilateral sphenoethmoidal recesses are patent. The left [facility] is occluded. The right [employer] is narrowed, but patent. Soft tissues: Soft tissue stranding in the premaxillary soft tissues on the right with extension along the right nare. In this region of soft tisuse stranding there is a more discrete region of soft tissue measuring approximately 1.0 x 1.0 cm that likely represents are area of phlegmonous changes and correlates with the palpable abnormality on clinical exam (series 3, image 45). 1.0 x 0.8 cm contrast-enhancing lesion in the right parotid gland likely represents a intraparotid lymph node. Limited intracranial: There is a nonspecific 6 mm x 6 mm lesion  IMPRESSION: 1. Soft tissue stranding in the premaxillary soft tissues on the right with extension along the right nare, consistent with cellulitis. More discrete region of soft tissue measuring approximately 1.0 x 1.0 cm in the premaxillary soft tissues likely represents phlegmonous changes and correlates with the palpable abnormality on clinical exam. 2. Air-fluid levels in bilateral maxillary sinuses, concerning for acute sinusitis. 3. Nonspecific 6 mm x 6 mm lesion in the genu of the corpus callosum. Recommend MRI of the brain with and without contrast for further evaluation. Electronically Signed   By: Lorenza Cambridge M.D.   On: 08/30/2022 14:27   Discharge Instructions: Discharge Instructions     Diet - low sodium heart healthy   Complete by: As directed    Discharge instructions   Complete by: As directed    [name],  It was a pleasure to care for you during your time at [facility]. You were treated for a skin infection of the skin on your nose. When you go home, you will need to continue taking antibiotics to complete treatment of this infection:  1. Doxycycline 100 mg twice daily for 8 days 2. Bactroban ointment applied over the nose three times daily for an additional 12 days  I will have the Internal Medicine Clinic reach out to schedule a follow-up visit with you.  My best, Dr. August Saucer   Discharge wound care:   Complete by: As directed    Apply bactroban ointment to the area three times daily for 12 additional days. Do not touch or pick at the wound.   Increase activity slowly   Complete by: As directed        Signed: Adron Bene, MD 09/06/2022, 3:09 PM   Pager: [phone]

## 2022-09-06 NOTE — Progress Notes (Addendum)
HD#6 SUBJECTIVE:  Patient Summary: [name] is a 71 y.o. with a pertinent PMH of gold E COPD with recent admission for exacerbation, HTN, T2DM, HLD, breast cancer who was directly admitted from the clinic for facial pain and swelling found to have R nare abscess, now POD1 from washout of a necrotic nasal abscess.  Overnight Events: None  Interim History: Patient feels like she is breathing much better.  She states that she would like to get back to her family but does not want to leave until her wound is healed.  She is concerned with cosmesis, and we discussed that her wound showed approximate and her edema should continue to improve.  OBJECTIVE:  Vital Signs: Vitals:   09/04/22 1617 09/04/22 2149 09/05/22 0609 09/06/22 0739  BP: (!) 96/59 (!) 134/59 (!) 133/97   Pulse: 84 91 90   Resp: '17 16 17   '$ Temp: 98 F (36.7 C) 97.8 F (36.6 C) 98.4 F (36.9 C)   TempSrc:  Oral Oral   SpO2: 98% 100% 100% 97%  Weight:      Height:       Supplemental O2: Room Air SpO2: 97 %  Filed Weights   08/31/22 0053 09/04/22 0821  Weight: 79 kg 79 kg     Intake/Output Summary (Last 24 hours) at 09/06/2022 1057 Last data filed at 09/05/2022 1100 Gross per 24 hour  Intake 240 ml  Output --  Net 240 ml   Net IO Since Admission: 3,554.28 mL [09/06/22 1057]  Physical Exam: Constitutional:Patient seems more uplifted, well. In no acute distress. HENT: Non-draining, non-bleeding scab over right nasal ala.  Edema improved, small incision healing.  Pulm:Normal work of breathing on room air. Neuro: Alert and oriented x3. No focal deficit noted. Psych: Pleasant mood and affect.  Patient Lines/Drains/Airways Status     Active Line/Drains/Airways     Name Placement date Placement time Site Days   Peripheral IV 11/03/21 20 G Left Antecubital 11/03/21  1355  Antecubital  306   Peripheral IV 09/03/22 22 G 1.75" Left;Anterior Forearm 09/03/22  1338  Forearm  2   Peripheral IV 09/04/22 20  G Left Hand 09/04/22  0836  Hand  1   Incision (Closed) 09/04/22 Nose Right 09/04/22  8588  -- 1             ASSESSMENT/PLAN:  Assessment: Principal Problem:   Facial cellulitis Active Problems:   Chronic obstructive pulmonary disease with bronchospasm (HCC)   Chronic sinusitis   Type 2 diabetes mellitus with complication, without long-term current use of insulin (HCC)   Anxiety   Facial edema   Plan: #Facial cellulitis complicated by nasal abscess #Bilateral maxillary sinusitis POD 2 from washout of R nare necrotic abscess. She has not requested her as needed pain meds for a couple days now.  Wound culture shows moderate staph aureus, sensitivities pending. She remains on vancomycin.  Afebrile without leukocytosis. -ENT following, appreciate assistance -Mupirocin ointment 3 times daily for 14 days to bilateral nares -Infectious disease recommending 10 days oral antibiotic therapy at discharge pending sensitivities -Continue IV vancomycin for now -Pain management with tylenol 650 q6h PRN, oxycodone 5 mg q6h PRN -Trend CBC   #COPD Clinically stable. No concerns for exacerbation at this time.  Will discontinue prednisone after today's dose.  She has been on steroids for 3 weeks. -Anoro Ellipta daily, albuterol nebs q4h PRN -DuoNebs TID, scheduled -Last dose of prednisone today -Mucinex BID PRN cough   #HTN Stable  on home regimen.  Normotensive today. -Continue home amlodipine 10 mg daily -Continue home HCTZ 25 mg daily -Continue home metoprolol tartrate 25 mg BID   #T2DM HbA1c 6.5 this admission.  Fasting glucose of 201 today.  Will follow as prednisone is weaned. -Continue SSI -Discontinue prednisone -Continue Lyrica 100 mg 3 times daily, Cymbalta 60 mg daily -CBGs ACHS given her long course of steroids   #HLD -Continue home Zocor 40 mg daily  #Incidental CT finding of corpus callosum mass Patient with incidental finding on CT maxillofacial described as 6 x 6  mm at the genu of corpus callosum.  Follow-up MRI brain demonstrated anatomical variant with no need for further work-up.  Best Practice: Diet: Diabetic diet IVF: Fluids: None VTE: enoxaparin (LOVENOX) injection 40 mg Start: 08/30/22 2200 Code: Full AB: Unasyn, vancomycin DISPO: Anticipated discharge pending IV antibiotics.  Signature: Linward Natal, MD  Internal Medicine Resident, PGY-1 [name]  Pager: [phone] 10:57 AM, 09/06/2022   Please contact the on call pager after 5 pm and on weekends at [phone].

## 2022-09-06 NOTE — Progress Notes (Addendum)
[facility] for Infectious Disease    Date of Admission:  08/30/2022   Total days of antibiotics 7/ day 4 vancomycin   ID: [name] is a 71 y.o. female with facial cellulitis and right nares abscess s/p Iz D on 10/27 ,and 10/28 Principal Problem:   Facial cellulitis Active Problems:   Chronic obstructive pulmonary disease with bronchospasm (HCC)   Chronic sinusitis   Type 2 diabetes mellitus with complication, without long-term current use of insulin (HCC)   Anxiety   Facial edema    Subjective: Afebrile, but worried about incision to right nares  Medications:   amLODipine  10 mg Oral Daily   atorvastatin  20 mg Oral q1800   DULoxetine  60 mg Oral Daily   enoxaparin (LOVENOX) injection  40 mg Subcutaneous Q24H   fentaNYL (SUBLIMAZE) injection  50 mcg Intravenous Once   hydrochlorothiazide  25 mg Oral Daily   insulin aspart  0-9 Units Subcutaneous TID WC   losartan  100 mg Oral Daily   melatonin  3 mg Oral QHS   metoprolol tartrate  25 mg Oral BID   mupirocin ointment   Topical TID   predniSONE  10 mg Oral Q breakfast   pregabalin  100 mg Oral TID   umeclidinium-vilanterol  1 puff Inhalation Daily    Objective: Vital signs in last 24 hours: Temp:  [98.1 F (36.7 C)-99.9 F (37.7 C)] 99.9 F (37.7 C) (10/30 0801) Pulse Rate:  [70-80] 80 (10/30 0801) Resp:  [16] 16 (10/30 0801) BP: (120-128)/(56-75) 120/56 (10/30 0801) SpO2:  [97 %-100 %] 100 % (10/30 0801)  Physical Exam  Constitutional:  oriented to person, place, and time. appears well-developed and well-nourished. No distress.  HENT: [place]/AT, PERRLA, no scleral icterus. Small sutures to right nares incision. Bruising noted. Face is symmetric Mouth/Throat: Oropharynx is clear and moist. No oropharyngeal exudate.  Cardiovascular: Normal rate, regular rhythm and normal heart sounds. Exam reveals no gallop and no friction rub.  No murmur heard.  Pulmonary/Chest: Effort normal and breath sounds  normal. No respiratory distress.  has no wheezes.  Neck = supple, no nuchal rigidity Abdominal: Soft. Bowel sounds are normal.  exhibits no distension. There is no tenderness.  Lymphadenopathy: no cervical adenopathy. No axillary adenopathy Neurological: alert and oriented to person, place, and time.  Skin: Skin is warm and dry. No rash noted. No erythema.  Psychiatric: a normal mood and affect.  behavior is normal.    Lab Results Recent Labs    09/05/22 0218 09/05/22 1210 09/06/22 0604  WBC 9.9 14.5* 8.9  HGB 10.2* 11.0* 11.0*  HCT 30.6* 31.5* 33.1*  NA 131* 132*  --   K 4.2 4.2  --   CL 95* 95*  --   CO2 25 25  --   BUN 12 12  --   CREATININE 0.85 0.75 0.78   Liver Panel No results for input(s): "PROT", "ALBUMIN", "AST", "ALT", "ALKPHOS", "BILITOT", "BILIDIR", "IBILI" in the last 72 hours. Sedimentation Rate No results for input(s): "ESRSEDRATE" in the last 72 hours. C-Reactive Protein No results for input(s): "CRP" in the last 72 hours.  Microbiology: Staphylococcus aureus      MIC    CIPROFLOXACIN >=8 RESISTANT  Resistant    CLINDAMYCIN <=0.25 SENS... Sensitive    ERYTHROMYCIN >=8 RESISTANT  Resistant    GENTAMICIN <=0.5 SENSI... Sensitive    Inducible Clindamycin NEGATIVE  Sensitive    OXACILLIN 0.5 SENSITIVE  Sensitive    RIFAMPIN <=0.5  SENSI... Sensitive    TETRACYCLINE <=1 SENSITIVE  Sensitive    TRIMETH/SULFA <=10 SENSIT... Sensitive    VANCOMYCIN 1 SENSITIVE  Sensitive    Studies/Results: MRI: Other: Peripherally enhancing collection in the soft tissues of the right nares. The larger collection measures 1.9 x 1.0 x 1.0 cm (AP x TR x CC) (series 22, image 6 and series 24, image 10). A smaller collection in the right pre malar soft tissues measures 1.3 x 1.1 x 1.4 cm (AP x TR x CC) (series 22, image 10 and series 24, image 9). Both collections are T1 hypointense, T2 hyperintense, and peripherally enhancing, most likely small abscesses.   IMPRESSION: 1.  No acute intracranial process. The previously described lesion in the genu of the corpus callosum correlates with a slightly atypical appearance of a cavum septum pellucidum, a common anatomic variant. 2. Peripherally enhancing collections in the soft tissues of the right nares and right pre malar soft tissues, most likely small abscesses. 3. Air-fluid levels in the left-greater-than-right maxillary sinus, which can be seen in the setting of acute sinusitis.  Assessment/Plan: MSSA soft tissue skin infection s/p debridement = will narrow antibiotics to cefazolin 2gm IV Q 8hr while hospitalized. When she is ready for discharge recommend to treat with 10 days of cephalexin '500mg'$  QID  or doxycycline '100mg'$  BID using 10/29 as day 1.  Will sign off.  [facility] for Infectious Diseases Pager: [phone]  09/06/2022, 2:22 PM

## 2022-09-06 NOTE — Progress Notes (Signed)
Explained discharge instructions to patient. Reviewed follow up appointment and next medication administration times. Also reviewed education. Patient verbalized having an understanding for instructions given. All belongings are in the patient's possession. Stopping to pick up his TOC meds. IV was removed by his nurse tech. No other needs verbalized. Transported downstairs for discharge.

## 2022-09-07 ENCOUNTER — Telehealth: Payer: Self-pay

## 2022-09-07 ENCOUNTER — Telehealth: Payer: Self-pay | Admitting: Student

## 2022-09-07 NOTE — Telephone Encounter (Signed)
-----   Message from Farrel Gordon, DO sent at 09/06/2022  1:58 PM EDT ----- Regarding: HFU Hello,  Can you assist me in scheduling Mrs. Lewinski for HFU in the next 2 weeks?  Thank you! Raquel Sarna

## 2022-09-07 NOTE — Telephone Encounter (Signed)
HFU appointment scheduled for 09/20/2022 at 8:45 am with Dr. Christiana Fuchs.  Just spoke with patient and she is agreeable with date and time. Will call back if any problems arise and she feels she needs to be seen sooner.

## 2022-09-07 NOTE — Patient Outreach (Signed)
  Care Coordination TOC Note Transition Care Management Follow-up Telephone Call Date of discharge and from where: [name] 08/30/22-09/06/22 How have you been since you were released from the hospital? "I am doing okay, resting and trying to be still". Any questions or concerns? No  Items Reviewed: Did the pt receive and understand the discharge instructions provided? Yes  Medications obtained and verified? Yes -Patient has not started her Doxycycline yet.  Discussed the importance of taking her antibiotic as ordered, '100mg'$ , 2x per day. Patient does have her Bactroban Ointment and we discussed using 3x day for 12 days. Other? No  Any new allergies since your discharge? No  Dietary orders reviewed? Yes Do you have support at home? Yes   Home Care and Equipment/Supplies: Were home health services ordered? no If so, what is the name of the agency? N/A  Has the agency set up a time to come to the patient's home? no Were any new equipment or medical supplies ordered?  No What is the name of the medical supply agency? N/A Were you able to get the supplies/equipment? not applicable Do you have any questions related to the use of the equipment or supplies? No  Functional Questionnaire: (I = Independent and D = Dependent) ADLs: I  Bathing/Dressing- I  Meal Prep- I  Eating- I  Maintaining continence- I  Transferring/Ambulation- I  Managing Meds- I  Follow up appointments reviewed:  PCP Hospital f/u appt confirmed? No  patient canceled appt yesterday, to reschedule Specialist Hospital f/u appt confirmed? No   Are transportation arrangements needed? No  If their condition worsens, is the pt aware to call PCP or go to the Emergency Dept.? Yes Was the patient provided with contact information for the PCP's office or ED? Yes Was to pt encouraged to call back with questions or concerns? Yes  SDOH assessments and interventions completed:   Yes  Care Coordination Interventions  Activated:  Yes   Care Coordination Interventions:  PCP follow up appointment requested   Encounter Outcome:  Pt. Visit Completed

## 2022-09-08 NOTE — Patient Outreach (Signed)
[name] 1951-09-06 [id number]  *Referral review:  [facility] Readmission Report  Primary Care Provider: Nani Gasser, MD, [facility] Internal Medicine  Chart was reviewed for readmission 08/30/22 to 09/06/22, for Facial cellulitis   Review completed and information form progress notes sent and patient with noted [facility] Transition of Care follow up 09/07/22.  For questions,  Natividad Brood, RN BSN [place]  [phone] business mobile phone Toll free office [phone]  *[place]  [phone] Fax number: [phone] [name]

## 2022-09-09 LAB — AEROBIC/ANAEROBIC CULTURE W GRAM STAIN (SURGICAL/DEEP WOUND)

## 2022-09-10 LAB — AEROBIC/ANAEROBIC CULTURE W GRAM STAIN (SURGICAL/DEEP WOUND)

## 2022-09-20 ENCOUNTER — Encounter: Payer: Medicare Other | Admitting: Internal Medicine

## 2022-09-20 ENCOUNTER — Ambulatory Visit: Payer: Medicare Other

## 2022-09-22 DIAGNOSIS — Z23 Encounter for immunization: Secondary | ICD-10-CM | POA: Diagnosis not present

## 2022-10-21 ENCOUNTER — Telehealth: Payer: Self-pay | Admitting: *Deleted

## 2022-10-21 NOTE — Telephone Encounter (Signed)
Call from pt requesting to speak to the doctor. Stated she has a cold and problem with her sinuses so she's has no taste nor smell. No fever. Requesting an abx - informed she needs an in-person appt; she refused - stated there's no need to come in, just need to talk to the doctor (she has had this before)  Telehealth appt schedule with Dr Nikki Dom tomorrow 12/15 @ 0945 AM.

## 2022-10-22 ENCOUNTER — Telehealth: Payer: Self-pay

## 2022-10-22 ENCOUNTER — Ambulatory Visit (INDEPENDENT_AMBULATORY_CARE_PROVIDER_SITE_OTHER): Payer: Medicare Other | Admitting: Student

## 2022-10-22 DIAGNOSIS — F17210 Nicotine dependence, cigarettes, uncomplicated: Secondary | ICD-10-CM | POA: Diagnosis not present

## 2022-10-22 DIAGNOSIS — J019 Acute sinusitis, unspecified: Secondary | ICD-10-CM | POA: Diagnosis not present

## 2022-10-22 DIAGNOSIS — J329 Chronic sinusitis, unspecified: Secondary | ICD-10-CM

## 2022-10-22 MED ORDER — AMOXICILLIN-POT CLAVULANATE 875-125 MG PO TABS: 1.0000 | ORAL_TABLET | Freq: Two times a day (BID) | ORAL | 0 refills | Status: AC

## 2022-10-22 NOTE — Telephone Encounter (Signed)
Pt states the pharmacist told her she cannot get sudafed because she is on bp medication. Please call pt back.

## 2022-10-22 NOTE — Assessment & Plan Note (Addendum)
Patient calls with complaints of 2 weeks of sinus congestion, loss of taste, loss of smell, cough with yellow mucus production, body aches, and intermittent chills that have not resolved after Tylenol, and fluticasone, nasal saline spray, and dextromethorphan.  She was recently hospitalized with facial cellulitis of the nose with MSSA and treated with debridement followed by doxycycline and Bactroban ointment.  She has not followed up since her hospitalization and is at high risk for another cellulitis or severe bacterial sinusitis. - Augmentin 875-125 mg twice daily for 7 days - Follow-up in person in the next 3 to 4 days - Advised to pick up some Sudafed to help with the congestion Addendum-due to her hypertension she was instructed not to pick up decongestants and to continue using saline nasal spray, fluticasone, and antihistamines.

## 2022-10-22 NOTE — Progress Notes (Signed)
  [facility] Residency Telephone Encounter Continuity Care Appointment  HPI:  This telephone encounter was created for [name] on 10/22/2022 for the following purpose/cc sinusitis.   Past Medical History:  Past Medical History:  Diagnosis Date   Acute sinusitis 01/09/2019   Anxiety    Arthritis    "fingers, knees" (08/16/2018)   Asthma    Atopic dermatitis 03/20/2018   Axillary hidradenitis suppurativa 10/13/2018   Cancer of right breast ([place]) 1991   s/p lumpectomy, chemotherapy and radiation therapy in 1991. Mammogram in 2007 was normal.   Constipated    h/o   COPD (chronic obstructive pulmonary disease) (HCC)    History of multiple hospital admissions for exercabation    COPD exacerbation ([place]) 01/01/2019   COPD with acute exacerbation ([place]) 01/14/2019   COPD with exacerbation ([place]) 04/06/2009   Qualifier: Diagnosis of  By: Eyvonne Mechanic MD, Vijay     Depression    Diarrhea    h/o   GERD (gastroesophageal reflux disease)    Headache    "a few times/month" (08/16/2018   Heart murmur 10/05/11   "first time I ever heard I had one was today"   History of breast cancer 12/01/2012   Pt with h/o breast CA s/p lumpectomy with chemo/radiation in 1991. Pt mammogram 2011 was unremarkable. Had CT chest 10/2012 in ED for SOB and showed spiculated nodule with lymph node. 12/04/12: Birads 2; repeat diagnostic mammogram in 1 year.     Hyperlipidemia    Hypertension    Lower extremity edema 09/21/2018   Obesity    Personal history of chemotherapy    Personal history of radiation therapy    Pneumonia    "couple times in the last 10-15 yrs" (08/16/2018)   QT prolongation 08/08/2014   Seasonal allergies 02/25/2017   Shortness of breath 10/05/11   "at rest; lying down; w/exertion"   Sigmoid diverticulitis 80/2008   Tobacco abuse    Type 2 diabetes mellitus ([place]) 05/14/2009   Type II diabetes mellitus ([place])      ROS:  Positive for congestion, anosmia, loss of taste,  cough, body aches, chills. Negative for bloody mucus, shortness of breath, purulent or other discharge from healing nose wound.   Assessment / Plan / Recommendations:  Please see A&P under problem oriented charting for assessment of the patient's acute and chronic medical conditions.  As always, pt is advised that if symptoms worsen or new symptoms arise, they should go to an urgent care facility or to to ER for further evaluation.   Consent and Medical Decision Making:  Patient discussed with Dr. Philipp Ovens This is a telephone encounter between [name] and [name] on 10/22/2022 for acute sinusitis. The visit was conducted with the patient located at home and [name] at [facility]. The patient's identity was confirmed using their DOB and current address. The patient has consented to being evaluated through a telephone encounter and understands the associated risks (an examination cannot be done and the patient may need to come in for an appointment) / benefits (allows the patient to remain at home, decreasing exposure to coronavirus). I personally spent 20 minutes on medical discussion.

## 2022-10-25 ENCOUNTER — Telehealth: Payer: Self-pay | Admitting: Student

## 2022-10-25 NOTE — Telephone Encounter (Signed)
Please refer to message below.  Just spoke with patient she declined to schedule an appointment at this time.  States she was called in an antibiotic and will call back if no improvement after taking medication.

## 2022-10-25 NOTE — Progress Notes (Signed)
Internal Medicine Clinic Attending  Case discussed with Dr. Nikki Dom  At the time of the visit.  We reviewed the resident's history and exam and pertinent patient test results.  I agree with the assessment, diagnosis, and plan of care documented in the resident's note.

## 2022-10-25 NOTE — Telephone Encounter (Signed)
-----   Message from Johny Blamer, DO sent at 10/22/2022 11:29 AM EST ----- Regarding: follow up Hey this patient needs a follow up for early next week for sinusitis. Monday or Tuesday preferably.

## 2022-11-23 ENCOUNTER — Ambulatory Visit (INDEPENDENT_AMBULATORY_CARE_PROVIDER_SITE_OTHER): Payer: Medicare Other | Admitting: Student

## 2022-11-23 DIAGNOSIS — J329 Chronic sinusitis, unspecified: Secondary | ICD-10-CM

## 2022-11-23 DIAGNOSIS — J019 Acute sinusitis, unspecified: Secondary | ICD-10-CM

## 2022-11-23 DIAGNOSIS — F17210 Nicotine dependence, cigarettes, uncomplicated: Secondary | ICD-10-CM | POA: Diagnosis not present

## 2022-11-23 MED ORDER — AMOXICILLIN-POT CLAVULANATE 875-125 MG PO TABS: 1.0000 | ORAL_TABLET | Freq: Two times a day (BID) | ORAL | 0 refills | Status: AC

## 2022-11-23 MED ORDER — LORATADINE 10 MG PO TABS: 10.0000 mg | ORAL_TABLET | Freq: Every day | ORAL | 11 refills | Status: DC

## 2022-11-23 MED ORDER — FLUTICASONE PROPIONATE 50 MCG/ACT NA SUSP: 1.0000 | Freq: Every day | NASAL | 3 refills | Status: DC

## 2022-11-23 NOTE — Progress Notes (Signed)
CC: sinus congestion  This is a telephone encounter between [name] and Angelique Blonder on 11/23/2022 for persistent sinus congestion. The visit was conducted with the patient located at home and Angelique Blonder at [facility]. The patient's identity was confirmed using their DOB and current address. The patient has consented to being evaluated through a telephone encounter and understands the associated risks (an examination cannot be done and the patient may need to come in for an appointment) / benefits (allows the patient to remain at home, decreasing exposure to coronavirus). I personally spent 13 minutes on medical discussion.   HPI:  [name] is a 72 y.o. with PMH as below.   Please see A&P for assessment of the patient's acute and chronic medical conditions.   Past Medical History:  Diagnosis Date   Acute sinusitis 01/09/2019   Anxiety    Arthritis    "fingers, knees" (08/16/2018)   Asthma    Atopic dermatitis 03/20/2018   Axillary hidradenitis suppurativa 10/13/2018   Cancer of right breast ([place]) 1991   s/p lumpectomy, chemotherapy and radiation therapy in 1991. Mammogram in 2007 was normal.   Constipated    h/o   COPD (chronic obstructive pulmonary disease) (HCC)    History of multiple hospital admissions for exercabation    COPD exacerbation ([place]) 01/01/2019   COPD with acute exacerbation ([place]) 01/14/2019   COPD with exacerbation ([place]) 04/06/2009   Qualifier: Diagnosis of  By: Eyvonne Mechanic MD, Vijay     Depression    Diarrhea    h/o   GERD (gastroesophageal reflux disease)    Headache    "a few times/month" (08/16/2018   Heart murmur 10/05/11   "first time I ever heard I had one was today"   History of breast cancer 12/01/2012   Pt with h/o breast CA s/p lumpectomy with chemo/radiation in 1991. Pt mammogram 2011 was unremarkable. Had CT chest 10/2012 in ED for SOB and showed spiculated nodule with lymph node. 12/04/12: Birads 2; repeat diagnostic mammogram in 1 year.      Hyperlipidemia    Hypertension    Lower extremity edema 09/21/2018   Obesity    Personal history of chemotherapy    Personal history of radiation therapy    Pneumonia    "couple times in the last 10-15 yrs" (08/16/2018)   QT prolongation 08/08/2014   Seasonal allergies 02/25/2017   Shortness of breath 10/05/11   "at rest; lying down; w/exertion"   Sigmoid diverticulitis 80/2008   Tobacco abuse    Type 2 diabetes mellitus ([place]) 05/14/2009   Type II diabetes mellitus ([place])    Review of Systems:   Positive for sinus congestion, facial fullness, cough, loss of taste, loss of smell Negative for fever, nausea, vomiting, facial pain or selling    Assessment & Plan:   Acute sinusitis Patient calls with ongoing sinus congestion, nasal drainage, loss of taste, loss of smell and cough with some mucus production. She had a telehealth visit on 12/15 with similar symptoms. Given 7 days of augmentin without significant symptom improvement. Has been taking Flonase and nasal saline spray daily. She would benefit from ENT referral given recurrent sinusitis. She is agreeable to seeing them given ongoing symptoms. Will prescribe another course of antibiotics along with continuing Flonase, saline spray and oral antihistamine. Advised patient that she can schedule clinic visit if symptoms worsen.   Plan -Augmentin 875-125 mg BID x 5 days -continue with nasal saline spray -refilled Flonase and Claritin -ENT referral  Patient discussed with Dr. Isabella Stalling, DO Internal Medicine Resident

## 2022-11-23 NOTE — Assessment & Plan Note (Addendum)
Patient calls with ongoing sinus congestion, nasal drainage, loss of taste, loss of smell and cough with some mucus production. She had a telehealth visit on 12/15 with similar symptoms. Given 7 days of augmentin without significant symptom improvement. Has been taking Flonase and nasal saline spray daily. She would benefit from ENT referral given recurrent sinusitis. She is agreeable to seeing them given ongoing symptoms. Will prescribe another course of antibiotics along with continuing Flonase, saline spray and oral antihistamine. Advised patient that she can schedule clinic visit if symptoms worsen.   Plan -Augmentin 875-125 mg BID x 5 days -continue with nasal saline spray -refilled Flonase and Claritin -ENT referral

## 2022-11-24 ENCOUNTER — Telehealth: Payer: Self-pay

## 2022-11-24 NOTE — Progress Notes (Signed)
Internal Medicine Clinic Attending  Case discussed with Dr. Zheng  At the time of the visit.  We reviewed the resident's history and exam and pertinent patient test results.  I agree with the assessment, diagnosis, and plan of care documented in the resident's note.  

## 2022-11-24 NOTE — Telephone Encounter (Signed)
Pt states amoxicillin and prednisone will not be covered through pt's insurance. Pt states she really need this medication by today. Please call pt back.

## 2022-11-24 NOTE — Telephone Encounter (Signed)
Return pt's call - pt stated she had called her insurance company about Amoxicillin rx and the pharmacy ran the rx back thru the system and it's covered.

## 2022-11-24 NOTE — Telephone Encounter (Signed)
Pt called back and states her insurance will covered for the medications.

## 2022-11-30 ENCOUNTER — Ambulatory Visit
Admission: EM | Admit: 2022-11-30 | Discharge: 2022-11-30 | Disposition: A | Discharge status: Home / Self Care | Payer: Medicare Other | Attending: Physician Assistant | Admitting: Physician Assistant

## 2022-11-30 DIAGNOSIS — J441 Chronic obstructive pulmonary disease with (acute) exacerbation: Secondary | ICD-10-CM

## 2022-11-30 DIAGNOSIS — R062 Wheezing: Secondary | ICD-10-CM

## 2022-11-30 DIAGNOSIS — R0602 Shortness of breath: Secondary | ICD-10-CM

## 2022-11-30 MED ORDER — IPRATROPIUM-ALBUTEROL 0.5-2.5 (3) MG/3ML IN SOLN
3.0000 mL | Freq: Once | RESPIRATORY_TRACT | Status: AC
  Administered 2022-11-30: 3 mL via RESPIRATORY_TRACT

## 2022-11-30 MED ORDER — METHYLPREDNISOLONE SODIUM SUCC 125 MG IJ SOLR: 60.0000 mg | Freq: Once | INTRAMUSCULAR | Status: DC

## 2022-11-30 MED ORDER — PREDNISONE 10 MG PO TABS: 10.0000 mg | ORAL_TABLET | Freq: Three times a day (TID) | ORAL | 0 refills | Status: DC

## 2022-11-30 MED ORDER — DM-GUAIFENESIN ER 30-600 MG PO TB12: 1.0000 | ORAL_TABLET | Freq: Two times a day (BID) | ORAL | 0 refills | Status: DC

## 2022-11-30 MED ORDER — METHYLPREDNISOLONE SODIUM SUCC 125 MG IJ SOLR
20.0000 mg | Freq: Once | INTRAMUSCULAR | Status: AC
  Administered 2022-11-30: 60 mg via INTRAMUSCULAR

## 2022-11-30 MED ORDER — ALBUTEROL SULFATE (2.5 MG/3ML) 0.083% IN NEBU: 2.5000 mg | INHALATION_SOLUTION | Freq: Four times a day (QID) | RESPIRATORY_TRACT | 12 refills | Status: DC | PRN

## 2022-11-30 NOTE — Discharge Instructions (Addendum)
Advised to take Mucinex DM every 12 hours to help decrease cough congestion. Advised to continue to use the albuterol nebulizer every 6 hours on a regular basis to help decrease wheezing and shortness of breath.  Advised to take the prednisone 10 mg 3 times a day for 5 days to help reduce respiratory inflammation.  Advised follow-up PCP or return to urgent care if symptoms fail to improve.

## 2022-11-30 NOTE — ED Triage Notes (Signed)
Pt presents to [facility] with co of sob and congestion for two weeks with increase of sob over the last few days. Pt reports she thinks she needs a breathing treatment

## 2022-11-30 NOTE — ED Provider Notes (Signed)
[facility]    CSN: [id number] Arrival date & time: 11/30/22  0827      History   Chief Complaint Chief Complaint  Patient presents with   Shortness of Breath    HPI [name] is a 72 y.o. female.   72 year old female presents with cough shortness of breath wheezing.  Patient indicates for the past week she has been having increasing cough, chest congestion, wheezing, and shortness of breath which has became worse over the past 2 to 3 days.  Patient indicates she has been using her albuterol inhaler and also her preventive inhaler but these have not been helping to relieve or improve her symptoms.  She indicates she has having extreme shortness of breath that is worse with coughing, movement, and walking.  Patient indicates she did see her PCP several days ago and she was given an antibiotic to treat infection however she was not given steroids at that time to help decrease the respiratory inflammation.  She indicates that usually she gets steroids along with antibiotic and this helps improve her symptoms and prevents her from having a worse wheezing attack.  Patient indicates that her sputum is thick, difficult to get up, she is having difficulty talking and giving a history due to her shortness of breath.  She is without fever or chills, no nausea or vomiting.  She does have a nebulizer unit at home but she does not have any of the albuterol Nebules.   Shortness of Breath Associated symptoms: cough and wheezing     Past Medical History:  Diagnosis Date   Acute sinusitis 01/09/2019   Anxiety    Arthritis    "fingers, knees" (08/16/2018)   Asthma    Atopic dermatitis 03/20/2018   Axillary hidradenitis suppurativa 10/13/2018   Cancer of right breast ([place]) 1991   s/p lumpectomy, chemotherapy and radiation therapy in 1991. Mammogram in 2007 was normal.   Constipated    h/o   COPD (chronic obstructive pulmonary disease) (HCC)    History of multiple hospital  admissions for exercabation    COPD exacerbation ([place]) 01/01/2019   COPD with acute exacerbation ([place]) 01/14/2019   COPD with exacerbation ([place]) 04/06/2009   Qualifier: Diagnosis of  By: Eyvonne Mechanic MD, Vijay     Depression    Diarrhea    h/o   GERD (gastroesophageal reflux disease)    Headache    "a few times/month" (08/16/2018   Heart murmur 10/05/11   "first time I ever heard I had one was today"   History of breast cancer 12/01/2012   Pt with h/o breast CA s/p lumpectomy with chemo/radiation in 1991. Pt mammogram 2011 was unremarkable. Had CT chest 10/2012 in ED for SOB and showed spiculated nodule with lymph node. 12/04/12: Birads 2; repeat diagnostic mammogram in 1 year.     Hyperlipidemia    Hypertension    Lower extremity edema 09/21/2018   Obesity    Personal history of chemotherapy    Personal history of radiation therapy    Pneumonia    "couple times in the last 10-15 yrs" (08/16/2018)   QT prolongation 08/08/2014   Seasonal allergies 02/25/2017   Shortness of breath 10/05/11   "at rest; lying down; w/exertion"   Sigmoid diverticulitis 80/2008   Tobacco abuse    Type 2 diabetes mellitus ([place]) 05/14/2009   Type II diabetes mellitus [facility])     Patient Active Problem List   Diagnosis Date Noted   Facial edema 08/31/2022  Facial cellulitis 08/30/2022   COPD exacerbation ([place]) 08/19/2022   Age-related osteoporosis without current pathological fracture  03/06/2021   Screening for colon cancer 03/06/2021   Breast nodule 04/22/2020   Chronic hip pain, right 06/22/2019   Diabetic neuropathy ([place]) 05/21/2019   Acute sinusitis 01/09/2019   Anxiety 01/09/2019   Type 2 diabetes mellitus with complication, without long-term current use of insulin ([place]) 12/26/2018   Chronic sinusitis    Chronic obstructive pulmonary disease with bronchospasm ([place]) 12/24/2014   Dyslipidemia associated with type 2 diabetes mellitus ([place]) 06/14/2014   Personal history of breast cancer 12/01/2012   Primary  osteoarthritis of right knee 09/29/2012   GAD (generalized anxiety disorder) 12/30/2009   Tobacco abuse 04/06/2009   Hypertension, benign essential, goal below 140/90 04/06/2009    Past Surgical History:  Procedure Laterality Date   ABDOMINAL HYSTERECTOMY     ANTERIOR CERVICAL DECOMP/DISCECTOMY FUSION  2012   "Dr. Lynann Bologna  put plate in; did something to my vertebrae"   BACK SURGERY     BREAST LUMPECTOMY Right 1991   DOBUTAMINE STRESS ECHO  08/2004   Inferior ischemia, normal LV systolic function, no significant CAD   MINOR IRRIGATION AND DEBRIDEMENT OF WOUND Right 09/04/2022   Procedure: IRRIGATION AND DEBRIDEMENT OF NASAL WOUND;  Surgeon: Jenetta Downer, MD;  Location: [facility] OR;  Service: ENT;  Laterality: Right;    OB History   No obstetric history on file.      Home Medications    Prior to Admission medications   Medication Sig Start Date End Date Taking? Authorizing Provider  albuterol (PROVENTIL) (2.5 MG/3ML) 0.083% nebulizer solution Take 3 mLs (2.5 mg total) by nebulization every 6 (six) hours as needed for wheezing or shortness of breath. 11/30/22  Yes Nyoka Lint, PA-C  dextromethorphan-guaiFENesin [facility] DM) 30-600 MG 12hr tablet Take 1 tablet by mouth 2 (two) times daily. 11/30/22  Yes Nyoka Lint, PA-C  predniSONE (DELTASONE) 10 MG tablet Take 1 tablet (10 mg total) by mouth 3 (three) times daily. 11/30/22  Yes Nyoka Lint, PA-C  acetaminophen (TYLENOL) 325 MG tablet Take 2 tablets (650 mg total) by mouth every 6 (six) hours as needed for mild pain (or Fever >/= 101). 08/23/22   Rick Duff, MD  albuterol (PROVENTIL HFA) 108 [phone] Base) MCG/ACT inhaler Inhale 1-2 puffs into the lungs every 6 (six) hours as needed for wheezing or shortness of breath. 08/23/22   Rick Duff, MD  albuterol (PROVENTIL) (2.5 MG/3ML) 0.083% nebulizer solution Take 3 mLs (2.5 mg total) by nebulization every 4 (four) hours as needed for up to 8 doses for wheezing or shortness of  breath. 08/24/22   Rick Duff, MD  amLODipine (NORVASC) 10 MG tablet Take 1 tablet (10 mg total) by mouth daily. 08/23/22   Rick Duff, MD  Blood Glucose Monitoring Suppl [facility] VERIO) w/Device KIT Use to check blood sugar once daily. (E11.8) 03/28/19   [name], MD  doxycycline (VIBRA-TABS) 100 MG tablet Take 1 tablet (100 mg total) by mouth 2 (two) times daily. 09/06/22   Farrel Gordon, DO  DULoxetine (CYMBALTA) 60 MG capsule Take 1 capsule (60 mg total) by mouth daily. 08/23/22   Rick Duff, MD  fluticasone [facility]) 50 MCG/ACT nasal spray Place 1 spray into both nostrils daily. 11/23/22 11/23/23  Angelique Blonder, DO  glucose blood [facility] VERIO) test strip Use to check blood sugar once daily. (E11.8) 03/28/19   [name], MD  hydrochlorothiazide (HYDRODIURIL) 25 MG tablet Take 1 tablet (25  mg total) by mouth daily. 08/23/22   Rick Duff, MD  loratadine (CLARITIN) 10 MG tablet Take 1 tablet (10 mg total) by mouth daily. 11/23/22   Angelique Blonder, DO  losartan (COZAAR) 100 MG tablet Take 1 tablet (100 mg total) by mouth daily. 08/23/22   Rick Duff, MD  metoprolol tartrate (LOPRESSOR) 25 MG tablet Take 1 tablet (25 mg total) by mouth 2 (two) times daily. 08/23/22   Rick Duff, MD  mupirocin ointment (BACTROBAN) 2 % Apply topically 3 (three) times daily. 09/06/22   Farrel Gordon, DO  OneTouch Delica Lancets 16B MISC Use to check blood sugar once daily (E11.8) 03/28/19   [name], MD  pregabalin (LYRICA) 100 MG capsule Take 100 mg by mouth 3 (three) times daily.    [provider]  simvastatin (ZOCOR) 40 MG tablet Take 1 tablet (40 mg total) by mouth every evening. 08/23/22   Rick Duff, MD  fluticasone-salmeterol (ADVAIR DISKUS) 250-50 MCG/ACT AEPB Inhale 1 puff into the lungs in the morning and at bedtime. 08/23/22 08/23/22  Rick Duff, MD    Family History Family History  Problem Relation Age of Onset    Cancer Mother    Breast cancer Daughter 80    Social History Social History   Tobacco Use   Smoking status: Every Day    Packs/day: 0.50    Years: 45.00    Total pack years: 22.50    Types: Cigarettes   Smokeless tobacco: Never   Tobacco comments:    1/3/pk every 2-3 days   Vaping Use   Vaping Use: Never used  Substance Use Topics   Alcohol use: Yes    Alcohol/week: 4.0 standard drinks of alcohol    Types: 4 Cans of beer per week    Comment: 08/16/2018 "weekends only"   Drug use: No     Allergies   Ace inhibitors   Review of Systems Review of Systems  Constitutional:  Positive for fatigue.  Respiratory:  Positive for cough, shortness of breath (wheezing and cough) and wheezing.      Physical Exam Triage Vital Signs ED Triage Vitals  Enc Vitals Group     BP 11/30/22 0851 (!) 175/95     Pulse Rate 11/30/22 0851 (!) 104     Resp 11/30/22 0851 (!) 28     Temp 11/30/22 0851 98.1 F (36.7 C)     Temp src --      SpO2 11/30/22 0851 98 %     Weight --      Height --      Head Circumference --      Peak Flow --      Pain Score 11/30/22 0853 0     Pain Loc --      Pain Edu? --      Excl. in [place]? --    No data found.  Updated Vital Signs BP (!) 175/95   Pulse (!) 104   Temp 98.1 F (36.7 C)   Resp (!) 28   SpO2 98%   Visual Acuity Right Eye Distance:   Left Eye Distance:   Bilateral Distance:    Right Eye Near:   Left Eye Near:    Bilateral Near:     Physical Exam Constitutional:      General: She is not in acute distress.    Appearance: She is ill-appearing (struggling to breath).     Comments: Is having difficulty breathing and has difficulty talking due to shortness of  breath, actively wheezing in the exam room.  Cardiovascular:     Rate and Rhythm: Regular rhythm. Tachycardia present.     Heart sounds: Normal heart sounds.  Pulmonary:     Effort: Tachypnea present.     Breath sounds: Normal air entry. Examination of the right-lower field  reveals wheezing. Examination of the left-lower field reveals wheezing. Wheezing (moderate wheezing present) present. No rhonchi or rales.  Neurological:     Mental Status: She is alert.    *After the second DuoNeb treatment patient is improved, breathing easier without wheezing or shortness of breath, is able to converse normally without stopping multiple times and midsentence.  UC Treatments / Results  Labs (all labs ordered are listed, but only abnormal results are displayed) Labs Reviewed - No data to display  EKG   Radiology No results found.  Procedures Procedures (including critical care time)  Medications Ordered in UC Medications  ipratropium-albuterol (DUONEB) 0.5-2.5 (3) MG/3ML nebulizer solution 3 mL (3 mLs Nebulization Given 11/30/22 0906)  methylPREDNISolone sodium succinate (SOLU-MEDROL) 125 mg/2 mL injection 20 mg (60 mg Intramuscular Given 11/30/22 0906)    Initial Impression / Assessment and Plan / UC Course  I have reviewed the triage vital signs and the nursing notes.  Pertinent labs & imaging results that were available during my care of the patient were reviewed by me and considered in my medical decision making (see chart for details).    Plan: The diagnosis will be treated with the following: 1.  COPD exacerbation: A.  DuoNeb treatment given x 2, in the office today. B.  Prednisone 10 mg every 8 hours for 5 days to treat wheezing shortness of breath. C.  Mucinex DM every 12 hours for congestion and cough. D.  Albuterol 0.03% every 6 hours on a regular basis to decrease wheezing. E.  Continue to use the preventive inhaler twice daily as treatment and preventive measures. 2.  Wheezing: A.  DuoNeb treatment in the office x 2 to help reduce wheezing. 3.  Shortness of breath: A.  DuoNeb treatment x 2 given in the office to decrease wheezing shortness of breath. B.  Solu-Medrol 60 mg given IM. 4.  Patient advised follow-up PCP or return to urgent care if  symptoms fail to improve. Final Clinical Impressions(s) / UC Diagnoses   Final diagnoses:  COPD exacerbation ([place])  Wheezing  SOB (shortness of breath)     Discharge Instructions      Advised to take Mucinex DM every 12 hours to help decrease cough congestion. Advised to continue to use the albuterol nebulizer every 6 hours on a regular basis to help decrease wheezing and shortness of breath.  Advised to take the prednisone 10 mg 3 times a day for 5 days to help reduce respiratory inflammation.  Advised follow-up PCP or return to urgent care if symptoms fail to improve.     ED Prescriptions     Medication Sig Dispense Auth. Provider   predniSONE (DELTASONE) 10 MG tablet Take 1 tablet (10 mg total) by mouth 3 (three) times daily. 15 tablet Nyoka Lint, PA-C   albuterol (PROVENTIL) (2.5 MG/3ML) 0.083% nebulizer solution Take 3 mLs (2.5 mg total) by nebulization every 6 (six) hours as needed for wheezing or shortness of breath. 75 mL Nyoka Lint, PA-C   dextromethorphan-guaiFENesin [facility] DM) 30-600 MG 12hr tablet Take 1 tablet by mouth 2 (two) times daily. 20 tablet Nyoka Lint, PA-C      PDMP not reviewed this encounter.  Nyoka Lint, PA-C 11/30/22 [id number]

## 2022-12-04 ENCOUNTER — Ambulatory Visit
Admission: EM | Admit: 2022-12-04 | Discharge: 2022-12-04 | Disposition: A | Discharge status: Home / Self Care | Payer: Medicare Other | Attending: Internal Medicine | Admitting: Internal Medicine

## 2022-12-04 DIAGNOSIS — T8090XA Unspecified complication following infusion and therapeutic injection, initial encounter: Secondary | ICD-10-CM | POA: Diagnosis not present

## 2022-12-04 DIAGNOSIS — L03317 Cellulitis of buttock: Secondary | ICD-10-CM

## 2022-12-04 MED ORDER — CETIRIZINE HCL 5 MG PO TABS: 5.0000 mg | ORAL_TABLET | Freq: Every day | ORAL | 0 refills | Status: DC

## 2022-12-04 MED ORDER — DOXYCYCLINE HYCLATE 100 MG PO CAPS: 100.0000 mg | ORAL_CAPSULE | Freq: Two times a day (BID) | ORAL | 0 refills | Status: DC

## 2022-12-04 NOTE — Discharge Instructions (Addendum)
You have an infection/injection site reaction to your buttocks.  I have prescribed an antibiotic and an antihistamine to help alleviate this.  Also recommend warm compresses and/or warm Epsom salt soaks.  Do not take Claritin with these medications. Please follow up on Monday to have recheck.  Go to ER if symptoms persist or worsen.  Follow-up with PCP at scheduled appointment.

## 2022-12-04 NOTE — ED Provider Notes (Signed)
[facility]    CSN: [id number] Arrival date & time: 12/04/22  0805      History   Chief Complaint Chief Complaint  Patient presents with   injection site complication    HPI [name] is a 72 y.o. female.   Patient presents with pain and swelling to left buttock that started after she got an injection at previous urgent care visit.  Patient reports that she was seen on 11/30/2022 and received an injection in her buttocks for her COPD exacerbation.  She states that approximately 1 day after she developed pain and swelling to the left buttocks around the injection site.  Denies any purulent drainage from the area.  Reports that is difficult to sit on her buttocks due to this.  She has taken Tylenol ibuprofen with minimal improvement.     Past Medical History:  Diagnosis Date   Acute sinusitis 01/09/2019   Anxiety    Arthritis    "fingers, knees" (08/16/2018)   Asthma    Atopic dermatitis 03/20/2018   Axillary hidradenitis suppurativa 10/13/2018   Cancer of right breast ([place]) 1991   s/p lumpectomy, chemotherapy and radiation therapy in 1991. Mammogram in 2007 was normal.   Constipated    h/o   COPD (chronic obstructive pulmonary disease) (HCC)    History of multiple hospital admissions for exercabation    COPD exacerbation ([place]) 01/01/2019   COPD with acute exacerbation ([place]) 01/14/2019   COPD with exacerbation ([place]) 04/06/2009   Qualifier: Diagnosis of  By: Eyvonne Mechanic MD, Vijay     Depression    Diarrhea    h/o   GERD (gastroesophageal reflux disease)    Headache    "a few times/month" (08/16/2018   Heart murmur 10/05/11   "first time I ever heard I had one was today"   History of breast cancer 12/01/2012   Pt with h/o breast CA s/p lumpectomy with chemo/radiation in 1991. Pt mammogram 2011 was unremarkable. Had CT chest 10/2012 in ED for SOB and showed spiculated nodule with lymph node. 12/04/12: Birads 2; repeat diagnostic mammogram in 1 year.      Hyperlipidemia    Hypertension    Lower extremity edema 09/21/2018   Obesity    Personal history of chemotherapy    Personal history of radiation therapy    Pneumonia    "couple times in the last 10-15 yrs" (08/16/2018)   QT prolongation 08/08/2014   Seasonal allergies 02/25/2017   Shortness of breath 10/05/11   "at rest; lying down; w/exertion"   Sigmoid diverticulitis 80/2008   Tobacco abuse    Type 2 diabetes mellitus ([place]) 05/14/2009   Type II diabetes mellitus ([place])     Patient Active Problem List   Diagnosis Date Noted   Facial edema 08/31/2022   Facial cellulitis 08/30/2022   COPD exacerbation ([place]) 08/19/2022   Age-related osteoporosis without current pathological fracture  03/06/2021   Screening for colon cancer 03/06/2021   Breast nodule 04/22/2020   Chronic hip pain, right 06/22/2019   Diabetic neuropathy ([place]) 05/21/2019   Acute sinusitis 01/09/2019   Anxiety 01/09/2019   Type 2 diabetes mellitus with complication, without long-term current use of insulin ([place]) 12/26/2018   Chronic sinusitis    Chronic obstructive pulmonary disease with bronchospasm ([place]) 12/24/2014   Dyslipidemia associated with type 2 diabetes mellitus ([place]) 06/14/2014   Personal history of breast cancer 12/01/2012   Primary osteoarthritis of right knee 09/29/2012   GAD (generalized anxiety disorder) 12/30/2009   Tobacco  abuse 04/06/2009   Hypertension, benign essential, goal below 140/90 04/06/2009    Past Surgical History:  Procedure Laterality Date   ABDOMINAL HYSTERECTOMY     ANTERIOR CERVICAL DECOMP/DISCECTOMY FUSION  2012   "Dr. Lynann Bologna  put plate in; did something to my vertebrae"   BACK SURGERY     BREAST LUMPECTOMY Right 1991   DOBUTAMINE STRESS ECHO  08/2004   Inferior ischemia, normal LV systolic function, no significant CAD   MINOR IRRIGATION AND DEBRIDEMENT OF WOUND Right 09/04/2022   Procedure: IRRIGATION AND DEBRIDEMENT OF NASAL WOUND;  Surgeon: Jenetta Downer, MD;   Location: [facility];  Service: ENT;  Laterality: Right;    OB History   No obstetric history on file.      Home Medications    Prior to Admission medications   Medication Sig Start Date End Date Taking? Authorizing Provider  cetirizine (ZYRTEC) 5 MG tablet Take 1 tablet (5 mg total) by mouth daily. 12/04/22  Yes Lilliam Chamblee, Hildred Alamin E, FNP  doxycycline (VIBRAMYCIN) 100 MG capsule Take 1 capsule (100 mg total) by mouth 2 (two) times daily. 12/04/22  Yes Salome Hautala, Michele Rockers, FNP  acetaminophen (TYLENOL) 325 MG tablet Take 2 tablets (650 mg total) by mouth every 6 (six) hours as needed for mild pain (or Fever >/= 101). 08/23/22   Rick Duff, MD  albuterol (PROVENTIL HFA) [phone] Base) MCG/ACT inhaler Inhale 1-2 puffs into the lungs every 6 (six) hours as needed for wheezing or shortness of breath. 08/23/22   Rick Duff, MD  albuterol (PROVENTIL) (2.5 MG/3ML) 0.083% nebulizer solution Take 3 mLs (2.5 mg total) by nebulization every 4 (four) hours as needed for up to 8 doses for wheezing or shortness of breath. 08/24/22   Rick Duff, MD  albuterol (PROVENTIL) (2.5 MG/3ML) 0.083% nebulizer solution Take 3 mLs (2.5 mg total) by nebulization every 6 (six) hours as needed for wheezing or shortness of breath. 11/30/22   Nyoka Lint, PA-C  amLODipine (NORVASC) 10 MG tablet Take 1 tablet (10 mg total) by mouth daily. 08/23/22   Rick Duff, MD  Blood Glucose Monitoring Suppl [facility] VERIO) w/Device KIT Use to check blood sugar once daily. (E11.8) 03/28/19   [name], MD  dextromethorphan-guaiFENesin [facility] DM) 30-600 MG 12hr tablet Take 1 tablet by mouth 2 (two) times daily. 11/30/22   Nyoka Lint, PA-C  DULoxetine (CYMBALTA) 60 MG capsule Take 1 capsule (60 mg total) by mouth daily. 08/23/22   Rick Duff, MD  fluticasone [facility]) 50 MCG/ACT nasal spray Place 1 spray into both nostrils daily. 11/23/22 11/23/23  Angelique Blonder, DO  glucose blood (ONETOUCH VERIO) test strip Use to  check blood sugar once daily. (E11.8) 03/28/19   [name], MD  hydrochlorothiazide (HYDRODIURIL) 25 MG tablet Take 1 tablet (25 mg total) by mouth daily. 08/23/22   Rick Duff, MD  loratadine (CLARITIN) 10 MG tablet Take 1 tablet (10 mg total) by mouth daily. 11/23/22   Angelique Blonder, DO  losartan (COZAAR) 100 MG tablet Take 1 tablet (100 mg total) by mouth daily. 08/23/22   Rick Duff, MD  metoprolol tartrate (LOPRESSOR) 25 MG tablet Take 1 tablet (25 mg total) by mouth 2 (two) times daily. 08/23/22   Rick Duff, MD  mupirocin ointment (BACTROBAN) 2 % Apply topically 3 (three) times daily. 09/06/22   Farrel Gordon, DO  OneTouch Delica Lancets 94B MISC Use to check blood sugar once daily (E11.8) 03/28/19   [name], MD  predniSONE (DELTASONE) 10 MG  tablet Take 1 tablet (10 mg total) by mouth 3 (three) times daily. 11/30/22   Nyoka Lint, PA-C  pregabalin (LYRICA) 100 MG capsule Take 100 mg by mouth 3 (three) times daily.    [provider]  simvastatin (ZOCOR) 40 MG tablet Take 1 tablet (40 mg total) by mouth every evening. 08/23/22   Rick Duff, MD  fluticasone-salmeterol (ADVAIR DISKUS) 250-50 MCG/ACT AEPB Inhale 1 puff into the lungs in the morning and at bedtime. 08/23/22 08/23/22  Rick Duff, MD    Family History Family History  Problem Relation Age of Onset   Cancer Mother    Breast cancer Daughter 12    Social History Social History   Tobacco Use   Smoking status: Every Day    Packs/day: 0.50    Years: 45.00    Total pack years: 22.50    Types: Cigarettes   Smokeless tobacco: Never   Tobacco comments:    1/3/pk every 2-3 days   Vaping Use   Vaping Use: Never used  Substance Use Topics   Alcohol use: Yes    Alcohol/week: 4.0 standard drinks of alcohol    Types: 4 Cans of beer per week    Comment: 08/16/2018 "weekends only"   Drug use: No     Allergies   Ace inhibitors   Review of Systems Review of  Systems Per HPI  Physical Exam Triage Vital Signs ED Triage Vitals [12/04/22 0821]  Enc Vitals Group     BP (!) 146/75     Pulse Rate (!) 113     Resp 16     Temp 99 F (37.2 C)     Temp Source Oral     SpO2 98 %     Weight      Height      Head Circumference      Peak Flow      Pain Score 9     Pain Loc      Pain Edu?      Excl. in [place]?    No data found.  Updated Vital Signs BP (!) 146/75 (BP Location: Left Arm)   Pulse (!) 113   Temp 99 F (37.2 C) (Oral)   Resp 16   SpO2 98%   Visual Acuity Right Eye Distance:   Left Eye Distance:   Bilateral Distance:    Right Eye Near:   Left Eye Near:    Bilateral Near:     Physical Exam Exam conducted with a chaperone present.  Constitutional:      General: She is not in acute distress.    Appearance: Normal appearance. She is not toxic-appearing or diaphoretic.  HENT:     Head: Normocephalic and atraumatic.  Eyes:     Extraocular Movements: Extraocular movements intact.     Conjunctiva/sclera: Conjunctivae normal.  Pulmonary:     Effort: Pulmonary effort is normal.  Skin:         Comments: Patient has approximately 5 inch x 4 inch in diameter indurated, erythematous area present to left upper buttocks.  No purulent drainage noted.  Mildly swollen but mainly flat.  Neurological:     General: No focal deficit present.     Mental Status: She is alert and oriented to person, place, and time. Mental status is at baseline.  Psychiatric:        Mood and Affect: Mood normal.        Behavior: Behavior normal.        Thought  Content: Thought content normal.        Judgment: Judgment normal.      UC Treatments / Results  Labs (all labs ordered are listed, but only abnormal results are displayed) Labs Reviewed - No data to display  EKG   Radiology No results found.  Procedures Procedures (including critical care time)  Medications Ordered in UC Medications - No data to display  Initial Impression /  Assessment and Plan / UC Course  I have reviewed the triage vital signs and the nursing notes.  Pertinent labs & imaging results that were available during my care of the patient were reviewed by me and considered in my medical decision making (see chart for details).     Patient appears to be having an injection site reaction.  Highly suspicious of cellulitis.  Will treat with doxycycline.  Will also treat with antihistamine.  Patient was previously prescribed loratadine but denies that she takes this medication.  Cetirizine may be more helpful so prescribed this at 5 mg due to patient's age and tolerability.  Patient was advised to use warm compresses and/or warm Epsom salt soaks.  She was advised to monitor this closely and follow-up if any symptoms persist or worsen.  She was advised to go to the ER if symptoms persist or worsen in the next 24 to 48 hours.  Advised patient to come back to urgent care in 48 hours to have recheck.  PCP appointment was made by clinical staff prior to discharge for 12/16/2022 for further evaluation as well.  Given that vital signs are stable and she has no fever, do not think that emergent evaluation is necessary.  She is slightly tachycardic but she was like this at previous urgent care visit and she appears to be in significant amount of pain so heart rate is most likely attributed to this.  Patient verbalized understanding and was agreeable with plan. Final Clinical Impressions(s) / UC Diagnoses   Final diagnoses:  Injection site reaction, initial encounter  Cellulitis of left buttock     Discharge Instructions      You have an infection/injection site reaction to your buttocks.  I have prescribed an antibiotic and an antihistamine to help alleviate this.  Also recommend warm compresses and/or warm Epsom salt soaks.  Do not take Claritin with these medications. Please follow up on Monday to have recheck.  Go to ER if symptoms persist or worsen.  Follow-up with  PCP at scheduled appointment.    ED Prescriptions     Medication Sig Dispense Auth. Provider   doxycycline (VIBRAMYCIN) 100 MG capsule Take 1 capsule (100 mg total) by mouth 2 (two) times daily. 20 capsule [place], [place] E, [place]   cetirizine (ZYRTEC) 5 MG tablet Take 1 tablet (5 mg total) by mouth daily. 30 tablet [place], Michele Rockers, [place]      PDMP not reviewed this encounter.   Teodora Medici, [employer] 12/04/22 [phone]

## 2022-12-04 NOTE — ED Triage Notes (Signed)
Pt states was seen at this UC for resp problems. When being seen was given two IM meds. Since then having a "knot" and severe pain around the inj site causing LROM.   Unable to recall current meds.

## 2022-12-07 ENCOUNTER — Ambulatory Visit: Admission: EM | Admit: 2022-12-07 | Discharge: 2022-12-07 | Payer: Medicare Other

## 2022-12-07 ENCOUNTER — Emergency Department (HOSPITAL_COMMUNITY)
Admission: EM | Admit: 2022-12-07 | Discharge: 2022-12-07 | Disposition: A | Discharge status: Home / Self Care | Payer: Medicare Other | Attending: Emergency Medicine | Admitting: Emergency Medicine

## 2022-12-07 ENCOUNTER — Emergency Department (HOSPITAL_COMMUNITY): Payer: Medicare Other

## 2022-12-07 ENCOUNTER — Other Ambulatory Visit: Payer: Self-pay

## 2022-12-07 ENCOUNTER — Encounter (HOSPITAL_COMMUNITY): Payer: Self-pay

## 2022-12-07 ENCOUNTER — Inpatient Hospital Stay: Admission: RE | Admit: 2022-12-07 | Payer: Medicare Other | Source: Ambulatory Visit

## 2022-12-07 DIAGNOSIS — T8029XD Infection following other infusion, transfusion and therapeutic injection, subsequent encounter: Secondary | ICD-10-CM | POA: Diagnosis not present

## 2022-12-07 DIAGNOSIS — R000 Tachycardia, unspecified: Secondary | ICD-10-CM | POA: Insufficient documentation

## 2022-12-07 DIAGNOSIS — L0231 Cutaneous abscess of buttock: Secondary | ICD-10-CM | POA: Insufficient documentation

## 2022-12-07 DIAGNOSIS — L539 Erythematous condition, unspecified: Secondary | ICD-10-CM | POA: Diagnosis not present

## 2022-12-07 DIAGNOSIS — Z79899 Other long term (current) drug therapy: Secondary | ICD-10-CM | POA: Insufficient documentation

## 2022-12-07 DIAGNOSIS — R102 Pelvic and perineal pain: Secondary | ICD-10-CM | POA: Diagnosis not present

## 2022-12-07 DIAGNOSIS — M79662 Pain in left lower leg: Secondary | ICD-10-CM | POA: Diagnosis not present

## 2022-12-07 DIAGNOSIS — M7989 Other specified soft tissue disorders: Secondary | ICD-10-CM | POA: Diagnosis not present

## 2022-12-07 DIAGNOSIS — L0390 Cellulitis, unspecified: Secondary | ICD-10-CM

## 2022-12-07 DIAGNOSIS — L03317 Cellulitis of buttock: Secondary | ICD-10-CM

## 2022-12-07 LAB — CBC WITH DIFFERENTIAL/PLATELET
Abs Immature Granulocytes: 0.08 10*3/uL — ABNORMAL HIGH (ref 0.00–0.07)
Basophils Absolute: 0 10*3/uL (ref 0.0–0.1)
Basophils Relative: 0 %
Eosinophils Absolute: 0.3 10*3/uL (ref 0.0–0.5)
Eosinophils Relative: 3 %
HCT: 39.5 % (ref 36.0–46.0)
Hemoglobin: 12.7 g/dL (ref 12.0–15.0)
Immature Granulocytes: 1 %
Lymphocytes Relative: 8 %
Lymphs Abs: 0.9 10*3/uL (ref 0.7–4.0)
MCH: 29.4 pg (ref 26.0–34.0)
MCHC: 32.2 g/dL (ref 30.0–36.0)
MCV: 91.4 fL (ref 80.0–100.0)
Monocytes Absolute: 0.6 10*3/uL (ref 0.1–1.0)
Monocytes Relative: 5 %
Neutro Abs: 8.6 10*3/uL — ABNORMAL HIGH (ref 1.7–7.7)
Neutrophils Relative %: 83 %
Platelets: 337 10*3/uL (ref 150–400)
RBC: 4.32 MIL/uL (ref 3.87–5.11)
RDW: 14.2 % (ref 11.5–15.5)
WBC: 10.5 10*3/uL (ref 4.0–10.5)
nRBC: 0 % (ref 0.0–0.2)

## 2022-12-07 LAB — BASIC METABOLIC PANEL
Anion gap: 10 (ref 5–15)
BUN: 9 mg/dL (ref 8–23)
CO2: 29 mmol/L (ref 22–32)
Calcium: 8.4 mg/dL — ABNORMAL LOW (ref 8.9–10.3)
Chloride: 95 mmol/L — ABNORMAL LOW (ref 98–111)
Creatinine, Ser: 0.84 mg/dL (ref 0.44–1.00)
GFR, Estimated: >60 mL/min (ref >60)
Glucose, Bld: 119 mg/dL — ABNORMAL HIGH (ref 70–99)
Potassium: 3 mmol/L — ABNORMAL LOW (ref 3.5–5.1)
Sodium: 134 mmol/L — ABNORMAL LOW (ref 135–145)

## 2022-12-07 LAB — LACTIC ACID, PLASMA: Lactic Acid, Venous: 1.4 mmol/L (ref 0.5–1.9)

## 2022-12-07 MED ORDER — LACTATED RINGERS IV SOLN: INTRAVENOUS | Status: DC

## 2022-12-07 MED ORDER — IOHEXOL 300 MG/ML  SOLN
100.0000 mL | Freq: Once | INTRAMUSCULAR | Status: AC | PRN
  Administered 2022-12-07: 100 mL via INTRAVENOUS

## 2022-12-07 MED ORDER — OXYCODONE-ACETAMINOPHEN 5-325 MG PO TABS: 1.0000 | ORAL_TABLET | Freq: Four times a day (QID) | ORAL | 0 refills | Status: DC | PRN

## 2022-12-07 MED ORDER — MORPHINE SULFATE (PF) 4 MG/ML IV SOLN
6.0000 mg | Freq: Once | INTRAVENOUS | Status: AC
  Administered 2022-12-07: 6 mg via INTRAVENOUS
  Filled 2022-12-07: qty 2

## 2022-12-07 MED ORDER — AMOXICILLIN-POT CLAVULANATE 875-125 MG PO TABS: 1.0000 | ORAL_TABLET | Freq: Two times a day (BID) | ORAL | 0 refills | Status: DC

## 2022-12-07 MED ORDER — SODIUM CHLORIDE (PF) 0.9 % IJ SOLN
INTRAMUSCULAR | Status: AC
  Filled 2022-12-07: qty 50

## 2022-12-07 MED ORDER — POTASSIUM CHLORIDE CRYS ER 20 MEQ PO TBCR
40.0000 meq | EXTENDED_RELEASE_TABLET | Freq: Once | ORAL | Status: AC
  Administered 2022-12-07: 40 meq via ORAL
  Filled 2022-12-07: qty 2

## 2022-12-07 NOTE — ED Notes (Signed)
Snacks and refreshments provided to pt

## 2022-12-07 NOTE — ED Provider Notes (Signed)
[facility]    CSN: [id number] Arrival date & time: 12/07/22  0808      History   Chief Complaint Chief Complaint  Patient presents with   Wound Infection    HPI [name] is a 72 y.o. female.   Patient here today for evaluation of continued redness and swelling to her left buttocks it has been present for the last several days.  She notes that symptoms started after she had 2 injections in the same area for COPD exacerbation.  She has not had any vomiting but has had some nausea.  She denies any known fever.  She does report exquisite tenderness to the area where injections were given and states she has been taking doxycycline but not prednisone that was prescribed.  The history is provided by the patient.    Past Medical History:  Diagnosis Date   Acute sinusitis 01/09/2019   Anxiety    Arthritis    "fingers, knees" (08/16/2018)   Asthma    Atopic dermatitis 03/20/2018   Axillary hidradenitis suppurativa 10/13/2018   Cancer of right breast ([place]) 1991   s/p lumpectomy, chemotherapy and radiation therapy in 1991. Mammogram in 2007 was normal.   Constipated    h/o   COPD (chronic obstructive pulmonary disease) (HCC)    History of multiple hospital admissions for exercabation    COPD exacerbation ([place]) 01/01/2019   COPD with acute exacerbation ([place]) 01/14/2019   COPD with exacerbation ([place]) 04/06/2009   Qualifier: Diagnosis of  By: Eyvonne Mechanic MD, Vijay     Depression    Diarrhea    h/o   GERD (gastroesophageal reflux disease)    Headache    "a few times/month" (08/16/2018   Heart murmur 10/05/11   "first time I ever heard I had one was today"   History of breast cancer 12/01/2012   Pt with h/o breast CA s/p lumpectomy with chemo/radiation in 1991. Pt mammogram 2011 was unremarkable. Had CT chest 10/2012 in ED for SOB and showed spiculated nodule with lymph node. 12/04/12: Birads 2; repeat diagnostic mammogram in 1 year.     Hyperlipidemia    Hypertension     Lower extremity edema 09/21/2018   Obesity    Personal history of chemotherapy    Personal history of radiation therapy    Pneumonia    "couple times in the last 10-15 yrs" (08/16/2018)   QT prolongation 08/08/2014   Seasonal allergies 02/25/2017   Shortness of breath 10/05/11   "at rest; lying down; w/exertion"   Sigmoid diverticulitis 80/2008   Tobacco abuse    Type 2 diabetes mellitus ([place]) 05/14/2009   Type II diabetes mellitus ([place])     Patient Active Problem List   Diagnosis Date Noted   Facial edema 08/31/2022   Facial cellulitis 08/30/2022   COPD exacerbation ([place]) 08/19/2022   Age-related osteoporosis without current pathological fracture  03/06/2021   Screening for colon cancer 03/06/2021   Breast nodule 04/22/2020   Chronic hip pain, right 06/22/2019   Diabetic neuropathy ([place]) 05/21/2019   Acute sinusitis 01/09/2019   Anxiety 01/09/2019   Type 2 diabetes mellitus with complication, without long-term current use of insulin ([place]) 12/26/2018   Chronic sinusitis    Chronic obstructive pulmonary disease with bronchospasm ([place]) 12/24/2014   Dyslipidemia associated with type 2 diabetes mellitus ([place]) 06/14/2014   Personal history of breast cancer 12/01/2012   Primary osteoarthritis of right knee 09/29/2012   GAD (generalized anxiety disorder) 12/30/2009   Tobacco abuse  04/06/2009   Hypertension, benign essential, goal below 140/90 04/06/2009    Past Surgical History:  Procedure Laterality Date   ABDOMINAL HYSTERECTOMY     ANTERIOR CERVICAL DECOMP/DISCECTOMY FUSION  2012   "Dr. Lynann Bologna  put plate in; did something to my vertebrae"   BACK SURGERY     BREAST LUMPECTOMY Right 1991   DOBUTAMINE STRESS ECHO  08/2004   Inferior ischemia, normal LV systolic function, no significant CAD   MINOR IRRIGATION AND DEBRIDEMENT OF WOUND Right 09/04/2022   Procedure: IRRIGATION AND DEBRIDEMENT OF NASAL WOUND;  Surgeon: Jenetta Downer, MD;  Location: [facility];  Service: ENT;  Laterality:  Right;    OB History   No obstetric history on file.      Home Medications    Prior to Admission medications   Medication Sig Start Date End Date Taking? Authorizing Provider  acetaminophen (TYLENOL) 325 MG tablet Take 2 tablets (650 mg total) by mouth every 6 (six) hours as needed for mild pain (or Fever >/= 101). 08/23/22  Yes Rick Duff, MD  albuterol (PROVENTIL HFA) 108 (90 Base) MCG/ACT inhaler Inhale 1-2 puffs into the lungs every 6 (six) hours as needed for wheezing or shortness of breath. 08/23/22  Yes Rick Duff, MD  albuterol (PROVENTIL) (2.5 MG/3ML) 0.083% nebulizer solution Take 3 mLs (2.5 mg total) by nebulization every 4 (four) hours as needed for up to 8 doses for wheezing or shortness of breath. 08/24/22  Yes Rick Duff, MD  albuterol (PROVENTIL) (2.5 MG/3ML) 0.083% nebulizer solution Take 3 mLs (2.5 mg total) by nebulization every 6 (six) hours as needed for wheezing or shortness of breath. 11/30/22  Yes Nyoka Lint, PA-C  amLODipine (NORVASC) 10 MG tablet Take 1 tablet (10 mg total) by mouth daily. 08/23/22  Yes Rick Duff, MD  Blood Glucose Monitoring Suppl [facility] VERIO) w/Device KIT Use to check blood sugar once daily. (E11.8) 03/28/19  Yes [name], MD  dextromethorphan-guaiFENesin [facility] DM) 30-600 MG 12hr tablet Take 1 tablet by mouth 2 (two) times daily. 11/30/22  Yes Nyoka Lint, PA-C  doxycycline (VIBRAMYCIN) 100 MG capsule Take 1 capsule (100 mg total) by mouth 2 (two) times daily. 12/04/22  Yes [place], Michele Rockers, FNP  DULoxetine (CYMBALTA) 60 MG capsule Take 1 capsule (60 mg total) by mouth daily. 08/23/22  Yes Rick Duff, MD  fluticasone [facility]) 50 MCG/ACT nasal spray Place 1 spray into both nostrils daily. 11/23/22 11/23/23 Yes Angelique Blonder, DO  glucose blood (ONETOUCH VERIO) test strip Use to check blood sugar once daily. (E11.8) 03/28/19  Yes [name], MD  hydrochlorothiazide (HYDRODIURIL) 25 MG tablet Take  1 tablet (25 mg total) by mouth daily. 08/23/22  Yes Rick Duff, MD  loratadine (CLARITIN) 10 MG tablet Take 1 tablet (10 mg total) by mouth daily. 11/23/22  Yes Angelique Blonder, DO  losartan (COZAAR) 100 MG tablet Take 1 tablet (100 mg total) by mouth daily. 08/23/22  Yes Rick Duff, MD  metoprolol tartrate (LOPRESSOR) 25 MG tablet Take 1 tablet (25 mg total) by mouth 2 (two) times daily. 08/23/22  Yes Rick Duff, MD  OneTouch Delica Lancets 55V MISC Use to check blood sugar once daily (E11.8) 03/28/19  Yes [name], MD  simvastatin (ZOCOR) 40 MG tablet Take 1 tablet (40 mg total) by mouth every evening. 08/23/22  Yes Rick Duff, MD  fluticasone-salmeterol (ADVAIR DISKUS) 250-50 MCG/ACT AEPB Inhale 1 puff into the lungs in the morning and at bedtime. 08/23/22 08/23/22  Rick Duff,  MD    Family History Family History  Problem Relation Age of Onset   Cancer Mother    Breast cancer Daughter 75    Social History Social History   Tobacco Use   Smoking status: Every Day    Packs/day: 0.50    Years: 45.00    Total pack years: 22.50    Types: Cigarettes   Smokeless tobacco: Never   Tobacco comments:    1/3/pk every 2-3 days   Vaping Use   Vaping Use: Never used  Substance Use Topics   Alcohol use: Yes    Alcohol/week: 4.0 standard drinks of alcohol    Types: 4 Cans of beer per week    Comment: 08/16/2018 "weekends only"   Drug use: No     Allergies   Ace inhibitors   Review of Systems Review of Systems  Constitutional:  Negative for chills and fever.  Eyes:  Negative for discharge and redness.  Gastrointestinal:  Negative for abdominal pain, nausea and vomiting.  Skin:  Positive for color change. Negative for wound.     Physical Exam Triage Vital Signs ED Triage Vitals  Enc Vitals Group     BP 12/07/22 0837 115/72     Pulse Rate 12/07/22 0837 (!) 113     Resp 12/07/22 0837 16     Temp 12/07/22 0837 98.5 F (36.9 C)     Temp  Source 12/07/22 0837 Oral     SpO2 12/07/22 0837 97 %     Weight --      Height --      Head Circumference --      Peak Flow --      Pain Score 12/07/22 0833 9     Pain Loc --      Pain Edu? --      Excl. in [place]? --    No data found.  Updated Vital Signs BP 115/72 (BP Location: Left Arm)   Pulse (!) 113   Temp 98.5 F (36.9 C) (Oral)   Resp 16   SpO2 97%      Physical Exam Vitals and nursing note reviewed.  Constitutional:      General: She is not in acute distress.    Appearance: Normal appearance. She is not ill-appearing.  HENT:     Head: Normocephalic and atraumatic.  Eyes:     Conjunctiva/sclera: Conjunctivae normal.  Cardiovascular:     Rate and Rhythm: Normal rate.  Pulmonary:     Effort: Pulmonary effort is normal. No respiratory distress.  Skin:    Comments: Significant erythema,  induration and TTP to left upper buttocks without any obvious fluctuance, drainage, or bleeding.   Neurological:     Mental Status: She is alert.  Psychiatric:        Mood and Affect: Mood normal.        Behavior: Behavior normal.        Thought Content: Thought content normal.      UC Treatments / Results  Labs (all labs ordered are listed, but only abnormal results are displayed) Labs Reviewed - No data to display  EKG   Radiology No results found.  Procedures Procedures (including critical care time)  Medications Ordered in UC Medications - No data to display  Initial Impression / Assessment and Plan / UC Course  I have reviewed the triage vital signs and the nursing notes.  Pertinent labs & imaging results that were available during my care of the patient were reviewed  by me and considered in my medical decision making (see chart for details).    Given worsening symptoms with no apparent drainable abscess discussed options for trial of oral prednisone at home with continued oral antibiotics versus evaluation in the emergency department for possible IV  antibiotics given seemingly failed oral therapy.  Patient prefers to be seen in the emergency department.  She will be transported via POV.  Final Clinical Impressions(s) / UC Diagnoses   Final diagnoses:  Injection site abscess, subsequent encounter  Cellulitis of left buttock     Discharge Instructions       Please report to [facility]     ED Prescriptions   None    PDMP not reviewed this encounter.   Francene Finders, PA-C 12/07/22 [phone]

## 2022-12-07 NOTE — ED Provider Notes (Signed)
[facility] Provider Note   CSN: [id number] Arrival date & time: 12/07/22  4696     History  Chief Complaint  Patient presents with   Abscess    [name] is a 72 y.o. female.  72 year old female presents for urgent care due to concern for left gluteal abscess.  2 weeks ago, patient received penicillin shots in her left glutes.  Since that time she has had increased redness as well as the area being tender to the touch.  Has been on antibiotics without relief.  Patient went to urgent care today and the plan was for conservative treatment initially but after reading her chart the patient requested to go to the ED for evaluation.  Patient denies any fever, vomiting.  No other infectious symptoms       Home Medications Prior to Admission medications   Medication Sig Start Date End Date Taking? Authorizing Provider  acetaminophen (TYLENOL) 325 MG tablet Take 2 tablets (650 mg total) by mouth every 6 (six) hours as needed for mild pain (or Fever >/= 101). 08/23/22   Rick Duff, MD  albuterol (PROVENTIL HFA) 108 [phone] Base) MCG/ACT inhaler Inhale 1-2 puffs into the lungs every 6 (six) hours as needed for wheezing or shortness of breath. 08/23/22   Rick Duff, MD  albuterol (PROVENTIL) (2.5 MG/3ML) 0.083% nebulizer solution Take 3 mLs (2.5 mg total) by nebulization every 4 (four) hours as needed for up to 8 doses for wheezing or shortness of breath. 08/24/22   Rick Duff, MD  albuterol (PROVENTIL) (2.5 MG/3ML) 0.083% nebulizer solution Take 3 mLs (2.5 mg total) by nebulization every 6 (six) hours as needed for wheezing or shortness of breath. 11/30/22   Nyoka Lint, PA-C  amLODipine (NORVASC) 10 MG tablet Take 1 tablet (10 mg total) by mouth daily. 08/23/22   Rick Duff, MD  Blood Glucose Monitoring Suppl [facility] VERIO) w/Device KIT Use to check blood sugar once daily. (E11.8) 03/28/19   [name], MD   dextromethorphan-guaiFENesin [facility] DM) 30-600 MG 12hr tablet Take 1 tablet by mouth 2 (two) times daily. 11/30/22   Nyoka Lint, PA-C  doxycycline (VIBRAMYCIN) 100 MG capsule Take 1 capsule (100 mg total) by mouth 2 (two) times daily. 12/04/22   Teodora Medici, FNP  DULoxetine (CYMBALTA) 60 MG capsule Take 1 capsule (60 mg total) by mouth daily. 08/23/22   Rick Duff, MD  fluticasone [facility]) 50 MCG/ACT nasal spray Place 1 spray into both nostrils daily. 11/23/22 11/23/23  Angelique Blonder, DO  glucose blood (ONETOUCH VERIO) test strip Use to check blood sugar once daily. (E11.8) 03/28/19   [name], MD  hydrochlorothiazide (HYDRODIURIL) 25 MG tablet Take 1 tablet (25 mg total) by mouth daily. 08/23/22   Rick Duff, MD  loratadine (CLARITIN) 10 MG tablet Take 1 tablet (10 mg total) by mouth daily. 11/23/22   Angelique Blonder, DO  losartan (COZAAR) 100 MG tablet Take 1 tablet (100 mg total) by mouth daily. 08/23/22   Rick Duff, MD  metoprolol tartrate (LOPRESSOR) 25 MG tablet Take 1 tablet (25 mg total) by mouth 2 (two) times daily. 08/23/22   Rick Duff, MD  OneTouch Delica Lancets 52W MISC Use to check blood sugar once daily (E11.8) 03/28/19   [name], MD  simvastatin (ZOCOR) 40 MG tablet Take 1 tablet (40 mg total) by mouth every evening. 08/23/22   Rick Duff, MD  fluticasone-salmeterol (ADVAIR DISKUS) 250-50 MCG/ACT AEPB Inhale 1 puff  into the lungs in the morning and at bedtime. 08/23/22 08/23/22  Rick Duff, MD      Allergies    Ace inhibitors    Review of Systems   Review of Systems  All other systems reviewed and are negative.   Physical Exam Updated Vital Signs BP 136/69   Pulse (!) 111   Temp 98.3 F (36.8 C) (Oral)   Resp 19   SpO2 100%  Physical Exam Vitals and nursing note reviewed.  Constitutional:      General: She is not in acute distress.    Appearance: Normal appearance. She is well-developed. She is not  toxic-appearing.  HENT:     Head: Normocephalic and atraumatic.  Eyes:     General: Lids are normal.     Conjunctiva/sclera: Conjunctivae normal.     Pupils: Pupils are equal, round, and reactive to light.  Neck:     Thyroid: No thyroid mass.     Trachea: No tracheal deviation.  Cardiovascular:     Rate and Rhythm: Regular rhythm. Tachycardia present.     Heart sounds: Normal heart sounds. No murmur heard.    No gallop.  Pulmonary:     Effort: Pulmonary effort is normal. No respiratory distress.     Breath sounds: Normal breath sounds. No stridor. No decreased breath sounds, wheezing, rhonchi or rales.  Abdominal:     General: There is no distension.     Palpations: Abdomen is soft.     Tenderness: There is no abdominal tenderness. There is no rebound.  Musculoskeletal:        General: No tenderness. Normal range of motion.     Cervical back: Normal range of motion and neck supple.       Legs:  Skin:    General: Skin is warm and dry.     Findings: No abrasion or rash.  Neurological:     Mental Status: She is alert and oriented to person, place, and time. Mental status is at baseline.     GCS: GCS eye subscore is 4. GCS verbal subscore is 5. GCS motor subscore is 6.     Cranial Nerves: No cranial nerve deficit.     Sensory: No sensory deficit.     Motor: Motor function is intact.  Psychiatric:        Attention and Perception: Attention normal.        Speech: Speech normal.        Behavior: Behavior normal.     ED Results / Procedures / Treatments   Labs (all labs ordered are listed, but only abnormal results are displayed) Labs Reviewed  CBC WITH DIFFERENTIAL/PLATELET  BASIC METABOLIC PANEL  LACTIC ACID, PLASMA  LACTIC ACID, PLASMA    EKG None  Radiology No results found.  Procedures Procedures    Medications Ordered in ED Medications  lactated ringers infusion (has no administration in time range)  morphine (PF) 4 MG/ML injection 6 mg (has no  administration in time range)    ED Course/ Medical Decision Making/ A&P                             Medical Decision Making Amount and/or Complexity of Data Reviewed Labs: ordered. Radiology: ordered.  Risk Prescription drug management.   Patient with left gluteal possible abscess.  No leukocytosis on CBC here.  CT scan done to evaluate for possible deep space infection.  No drainable abscess defined.  Patient  already on doxycycline we will add Augmentin.  Return precautions given.        Final Clinical Impression(s) / ED Diagnoses Final diagnoses:  None    Rx / DC Orders ED Discharge Orders     None         Lacretia Leigh, MD 12/07/22 1352

## 2022-12-07 NOTE — Discharge Instructions (Addendum)
  Please report to [facility]

## 2022-12-07 NOTE — ED Notes (Signed)
Pt used bedpan. Lunch tray ordered

## 2022-12-07 NOTE — ED Triage Notes (Signed)
Pt reports having injection at 01/23 UC visit and developed swelling around area of injection in L buttock. Returned to clinic and was told to take warm baths. Has been taking epsom salt baths with no improvement. Pain is worsening, swelling is still present.   Has not taken anything for the pain today. Has been taking the Doxy.

## 2022-12-07 NOTE — ED Notes (Signed)
Pt to Ct via stretcher

## 2022-12-07 NOTE — ED Notes (Signed)
Care assumed. Report received from [place], [place]

## 2022-12-07 NOTE — ED Triage Notes (Addendum)
Patient sent from urgent care for an abscess on her left buttocks. Received 2 penicillin shots before arrival. Painful to touch.

## 2022-12-07 NOTE — ED Notes (Signed)
Patient is being discharged from the Urgent Care and sent to the Emergency Department via personal vehicle . Per Provider Ewell Poe, patient is in need of higher level of care due to cellulitis of buttocks. Patient is aware and verbalizes understanding of plan of care.   Vitals:   12/07/22 0837  BP: 115/72  Pulse: (!) 113  Resp: 16  Temp: 98.5 F (36.9 C)  SpO2: 97%

## 2022-12-13 ENCOUNTER — Other Ambulatory Visit: Payer: Self-pay | Admitting: Student

## 2022-12-13 ENCOUNTER — Ambulatory Visit (INDEPENDENT_AMBULATORY_CARE_PROVIDER_SITE_OTHER): Payer: Medicare Other | Admitting: Student

## 2022-12-13 ENCOUNTER — Encounter: Payer: Self-pay | Admitting: Student

## 2022-12-13 ENCOUNTER — Ambulatory Visit (INDEPENDENT_AMBULATORY_CARE_PROVIDER_SITE_OTHER): Payer: Medicare Other

## 2022-12-13 ENCOUNTER — Other Ambulatory Visit (HOSPITAL_COMMUNITY): Payer: Self-pay

## 2022-12-13 ENCOUNTER — Telehealth: Payer: Self-pay | Admitting: *Deleted

## 2022-12-13 ENCOUNTER — Telehealth: Payer: Self-pay

## 2022-12-13 VITALS — BP 165/82 | HR 88 | Temp 98.4°F | Ht 65.98 in | Wt 162.5 lb

## 2022-12-13 VITALS — BP 150/72 | HR 105 | Temp 98.4°F | Ht 65.98 in | Wt 162.5 lb

## 2022-12-13 DIAGNOSIS — F17210 Nicotine dependence, cigarettes, uncomplicated: Secondary | ICD-10-CM | POA: Diagnosis not present

## 2022-12-13 DIAGNOSIS — Z23 Encounter for immunization: Secondary | ICD-10-CM

## 2022-12-13 DIAGNOSIS — Z0000 Encounter for general adult medical examination without abnormal findings: Secondary | ICD-10-CM

## 2022-12-13 DIAGNOSIS — E118 Type 2 diabetes mellitus with unspecified complications: Secondary | ICD-10-CM | POA: Diagnosis not present

## 2022-12-13 DIAGNOSIS — L03317 Cellulitis of buttock: Secondary | ICD-10-CM

## 2022-12-13 DIAGNOSIS — F419 Anxiety disorder, unspecified: Secondary | ICD-10-CM | POA: Diagnosis not present

## 2022-12-13 DIAGNOSIS — C50111 Malignant neoplasm of central portion of right female breast: Secondary | ICD-10-CM

## 2022-12-13 DIAGNOSIS — E119 Type 2 diabetes mellitus without complications: Secondary | ICD-10-CM

## 2022-12-13 DIAGNOSIS — J441 Chronic obstructive pulmonary disease with (acute) exacerbation: Secondary | ICD-10-CM

## 2022-12-13 DIAGNOSIS — N63 Unspecified lump in unspecified breast: Secondary | ICD-10-CM

## 2022-12-13 DIAGNOSIS — I10 Essential (primary) hypertension: Secondary | ICD-10-CM

## 2022-12-13 DIAGNOSIS — Z720 Tobacco use: Secondary | ICD-10-CM

## 2022-12-13 DIAGNOSIS — Z1211 Encounter for screening for malignant neoplasm of colon: Secondary | ICD-10-CM

## 2022-12-13 HISTORY — DX: Cellulitis of buttock: L03.317

## 2022-12-13 MED ORDER — SIMVASTATIN 40 MG PO TABS: 40.0000 mg | ORAL_TABLET | Freq: Every evening | ORAL | 0 refills | Status: DC

## 2022-12-13 MED ORDER — ALBUTEROL SULFATE HFA 108 (90 BASE) MCG/ACT IN AERS: 2.0000 | INHALATION_SPRAY | Freq: Four times a day (QID) | RESPIRATORY_TRACT | 2 refills | Status: DC | PRN

## 2022-12-13 MED ORDER — AMLODIPINE BESYLATE 10 MG PO TABS: 10.0000 mg | ORAL_TABLET | Freq: Every day | ORAL | 3 refills | Status: DC

## 2022-12-13 MED ORDER — LOSARTAN POTASSIUM-HCTZ 50-12.5 MG PO TABS: 1.0000 | ORAL_TABLET | Freq: Two times a day (BID) | ORAL | 3 refills | Status: DC

## 2022-12-13 MED ORDER — SPIRIVA RESPIMAT 2.5 MCG/ACT IN AERS: 2.0000 | INHALATION_SPRAY | Freq: Every day | RESPIRATORY_TRACT | 0 refills | Status: DC

## 2022-12-13 MED ORDER — METOPROLOL TARTRATE 25 MG PO TABS: 25.0000 mg | ORAL_TABLET | Freq: Two times a day (BID) | ORAL | 3 refills | Status: DC

## 2022-12-13 MED ORDER — DULOXETINE HCL 60 MG PO CPEP: 60.0000 mg | ORAL_CAPSULE | Freq: Every day | ORAL | 3 refills | Status: DC

## 2022-12-13 NOTE — Assessment & Plan Note (Signed)
Patient with uncontrolled HTN. She has difficulty affording medications AND has problems remembering taking them. She denies chest pain, SOB, HA, or changes in vision today.  Vitals:   12/13/22 0828 12/13/22 0937  BP: (!) 150/112 (!) 165/82   Last BMP on 1/30 with K of 3.  BP improved the second time, but still not at goal. The last time her medications were refilled was in Oct 2023. Refilled amlodipine and metropolol tartrate, and combined Losartan-HCTZ 24-12.5 mg BID. Discussed with Ms. Chilon to assess whether we could help with medications assistance; patient is to let us know what her co-pays are with her pharmacy card and go from there -return in 4 weeks for BP check and med adherence - BMP today for K check

## 2022-12-13 NOTE — Assessment & Plan Note (Signed)
1/30 ED visit for injection site L gluteal abscess . No fever, leukocytosis, or deep space infection per CT. No drainale abscess defined. Patient had been on Doxycycline 100 mg BID, added Augmentin at ID visit.   Patient overall feeling better without signs of systemic inflammation or infection. No fluctuance or erythema today. Decreased in size. No purulence noted today. Per patient it is improving. Gave strict return precautions should this worsens rather than improves.

## 2022-12-13 NOTE — Progress Notes (Signed)
Subjective:   [name] is a 72 y.o. female who presents for an Initial Medicare Annual Wellness Visit. I connected with  [name] on 12/13/22 by a  Face-To-Face  enabled telemedicine application and verified that I am speaking with the correct person using two identifiers.  Patient Location: Other:  Office/Clinic  Provider Location: Office/Clinic  I discussed the limitations of evaluation and management by telemedicine. The patient expressed understanding and agreed to proceed.  Review of Systems    Defer to PCP       Objective:    Today's Vitals   12/13/22 1449  BP: (!) 150/72  Pulse: (!) 105  Temp: 98.4 F (36.9 C)  TempSrc: Oral  SpO2: 100%  Weight: 162 lb 8 oz (73.7 kg)  Height: 5' 5.98" (1.676 m)  PainSc: 0-No pain   Body mass index is 26.24 kg/m.     12/13/2022    2:50 PM 12/13/2022    8:30 AM 12/07/2022    9:28 AM 08/31/2022   12:00 AM 08/14/2022    8:00 PM 05/31/2022   10:38 AM 05/21/2022   10:18 AM  Advanced Directives  Does Patient Have a Medical Advance Directive? No No No No No No No  Would patient like information on creating a medical advance directive? No - Patient declined No - Patient declined No - Patient declined No - Patient declined No - Patient declined No - Patient declined No - Patient declined    Current Medications (verified) Outpatient Encounter Medications as of 12/13/2022  Medication Sig   acetaminophen (TYLENOL) 325 MG tablet Take 2 tablets (650 mg total) by mouth every 6 (six) hours as needed for mild pain (or Fever >/= 101).   albuterol (VENTOLIN HFA) 108 (90 Base) MCG/ACT inhaler Inhale 2 puffs into the lungs every 6 (six) hours as needed for wheezing or shortness of breath.   amLODipine (NORVASC) 10 MG tablet Take 1 tablet (10 mg total) by mouth daily.   Blood Glucose Monitoring Suppl (ONETOUCH VERIO) w/Device KIT Use to check blood sugar once daily. (E11.8)   DULoxetine (CYMBALTA) 60 MG capsule Take 1 capsule (60 mg  total) by mouth daily.   fluticasone (FLONASE) 50 MCG/ACT nasal spray Place 1 spray into both nostrils daily.   loratadine (CLARITIN) 10 MG tablet Take 1 tablet (10 mg total) by mouth daily.   losartan-hydrochlorothiazide (HYZAAR) 50-12.5 MG tablet Take 1 tablet by mouth in the morning and at bedtime.   metoprolol tartrate (LOPRESSOR) 25 MG tablet Take 1 tablet (25 mg total) by mouth 2 (two) times daily.   simvastatin (ZOCOR) 40 MG tablet Take 1 tablet (40 mg total) by mouth every evening.   Tiotropium Bromide Monohydrate (SPIRIVA RESPIMAT) 2.5 MCG/ACT AERS Inhale 2 puffs into the lungs daily.   [DISCONTINUED] fluticasone-salmeterol (ADVAIR DISKUS) 250-50 MCG/ACT AEPB Inhale 1 puff into the lungs in the morning and at bedtime.   No facility-administered encounter medications on file as of 12/13/2022.    Allergies (verified) Ace inhibitors   History: Past Medical History:  Diagnosis Date   Acute sinusitis 01/09/2019   Anxiety    Arthritis    "fingers, knees" (08/16/2018)   Asthma    Atopic dermatitis 03/20/2018   Axillary hidradenitis suppurativa 10/13/2018   Cancer of right breast ([place]) 1991   s/p lumpectomy, chemotherapy and radiation therapy in 1991. Mammogram in 2007 was normal.   Constipated    h/o   COPD (chronic obstructive pulmonary disease) ([place])    History  of multiple hospital admissions for exercabation    COPD exacerbation ([place]) 01/01/2019   COPD with acute exacerbation ([place]) 01/14/2019   COPD with exacerbation ([place]) 04/06/2009   Qualifier: Diagnosis of  By: Eyvonne Mechanic MD, Vijay     Depression    Diarrhea    h/o   GERD (gastroesophageal reflux disease)    Headache    "a few times/month" (08/16/2018   Heart murmur 10/05/11   "first time I ever heard I had one was today"   History of breast cancer 12/01/2012   Pt with h/o breast CA s/p lumpectomy with chemo/radiation in 1991. Pt mammogram 2011 was unremarkable. Had CT chest 10/2012 in ED for SOB and showed spiculated nodule with  lymph node. 12/04/12: Birads 2; repeat diagnostic mammogram in 1 year.     Hyperlipidemia    Hypertension    Lower extremity edema 09/21/2018   Obesity    Personal history of chemotherapy    Personal history of radiation therapy    Pneumonia    "couple times in the last 10-15 yrs" (08/16/2018)   QT prolongation 08/08/2014   Seasonal allergies 02/25/2017   Shortness of breath 10/05/11   "at rest; lying down; w/exertion"   Sigmoid diverticulitis 80/2008   Tobacco abuse    Type 2 diabetes mellitus ([place]) 05/14/2009   Type II diabetes mellitus ([place])    Past Surgical History:  Procedure Laterality Date   ABDOMINAL HYSTERECTOMY     ANTERIOR CERVICAL DECOMP/DISCECTOMY FUSION  2012   "Dr. Lynann Bologna  put plate in; did something to my vertebrae"   BACK SURGERY     BREAST LUMPECTOMY Right 1991   DOBUTAMINE STRESS ECHO  08/2004   Inferior ischemia, normal LV systolic function, no significant CAD   MINOR IRRIGATION AND DEBRIDEMENT OF WOUND Right 09/04/2022   Procedure: IRRIGATION AND DEBRIDEMENT OF NASAL WOUND;  Surgeon: Jenetta Downer, MD;  Location: [facility] OR;  Service: ENT;  Laterality: Right;   Family History  Problem Relation Age of Onset   Cancer Mother    Breast cancer Daughter 69   Social History   Socioeconomic History   Marital status: Married    Spouse name: Not on file   Number of children: Not on file   Years of education: 12   Highest education level: Not on file  Occupational History    Employer: UNEMPLOYED  Tobacco Use   Smoking status: Every Day    Packs/day: 0.50    Years: 45.00    Total pack years: 22.50    Types: Cigarettes   Smokeless tobacco: Never   Tobacco comments:    1/3/pk every 2-3 days   Vaping Use   Vaping Use: Never used  Substance and Sexual Activity   Alcohol use: Yes    Alcohol/week: 4.0 standard drinks of alcohol    Types: 4 Cans of beer per week    Comment: 08/16/2018 "weekends only"   Drug use: No   Sexual activity: Yes  Other Topics Concern    Not on file  Social History Narrative   Lived in [place] with her husband.   Takes care of 3 grand children.   Trying to find a job, has financial difficulties.      Social Determinants of Health   Financial Resource Strain: Low Risk  (12/13/2022)   Overall Financial Resource Strain (CARDIA)    Difficulty of Paying Living Expenses: Not hard at all  Food Insecurity: No Food Insecurity (12/13/2022)   Hunger Vital Sign  Worried About Charity fundraiser in the Last Year: Never true    [employer] in the Last Year: Never true  Transportation Needs: No Transportation Needs (12/13/2022)   PRAPARE - Hydrologist (Medical): No    Lack of Transportation (Non-Medical): No  Physical Activity: Insufficiently Active (12/13/2022)   Exercise Vital Sign    Days of Exercise per Week: 2 days    Minutes of Exercise per Session: 30 min  Stress: No Stress Concern Present (12/13/2022)   [place]    Feeling of Stress : Not at all  Social Connections: Unknown (12/13/2022)   Social Connection and Isolation Panel [NHANES]    Frequency of Communication with Friends and Family: Patient refused    Frequency of Social Gatherings with Friends and Family: Patient refused    Attends Religious Services: Patient refused    Marine scientist or Organizations: Patient refused    Attends Music therapist: Patient refused    Marital Status: Married    Tobacco Counseling Ready to quit: Not Answered Counseling given: Not Answered Tobacco comments: 1/3/pk every 2-3 days    Clinical Intake:  Pre-visit preparation completed: Yes  Pain : No/denies pain Pain Score: 0-No pain     Nutritional Risks: None Diabetes: Yes CBG done?: No Did pt. bring in CBG monitor from home?: No  How often do you need to have someone help you when you read instructions, pamphlets, or other written materials from your  doctor or pharmacy?: 1 - Never What is the last grade level you completed in school?: 12th grade  Diabetic?Nutrition Risk Assessment:  Has the patient had any N/V/D within the last 2 months?  No  Does the patient have any non-healing wounds?  No  Has the patient had any unintentional weight loss or weight gain?  No   Diabetes:  Is the patient diabetic?  Yes  If diabetic, was a CBG obtained today?  Yes  Did the patient bring in their glucometer from home?  No  How often do you monitor your CBG's? Every 3 months.   Financial Strains and Diabetes Management:  Are you having any financial strains with the device, your supplies or your medication? No .  Does the patient want to be seen by Chronic Care Management for management of their diabetes?  No  Would the patient like to be referred to a Nutritionist or for Diabetic Management?  No   Diabetic Exams:  Diabetic Eye Exam: Overdue for diabetic eye exam. Pt has been advised about the importance in completing this exam. Patient advised to call and schedule an eye exam. Diabetic Foot Exam: Completed 08/30/2022    Interpreter Needed?: No  Information entered by :: Carlester Kasparek,cma 12/13/22 2:50pm   Activities of Daily Living    12/13/2022    2:51 PM 12/13/2022    8:30 AM  In your present state of health, do you have any difficulty performing the following activities:  Hearing? 0 0  Vision? 0 0  Difficulty concentrating or making decisions? 0 0  Walking or climbing stairs? 0 0  Dressing or bathing? 0 0  Doing errands, shopping? 0 0    Patient Care Team: Nani Gasser, MD as PCP - Sharla Kidney, RN as [place] [facility] Management [place], Norva Riffle, LCSW as [place] Management (Licensed Clinical Social Worker)  Indicate any recent [employer] you may  have received from other than [facility] providers in the past year (date may be approximate).     Assessment:   This is a routine  wellness examination for [place]e.  Hearing/Vision screen No results found.  Dietary issues and exercise activities discussed:     Goals Addressed   None   Depression Screen    12/13/2022    2:50 PM 12/13/2022    8:45 AM 08/30/2022    9:46 AM 05/31/2022   10:38 AM 01/11/2022    1:36 PM 10/07/2021   11:08 AM 03/04/2021    9:23 AM  PHQ 2/9 Scores  PHQ - 2 Score '2 2 1 '$ 0 0 0 1  PHQ- 9 Score '4 4 6  2 1 3    '$ Fall Risk    12/13/2022    2:50 PM 12/13/2022    8:29 AM 08/30/2022    9:46 AM 05/31/2022   10:38 AM 05/21/2022   10:19 AM  Fall Risk   Falls in the past year? 0 0 0 0 0  Number falls in past yr: 0 0     Injury with Fall? 0 0     Risk for fall due to : No Fall Risks No Fall Risks No Fall Risks No Fall Risks No Fall Risks  Follow up Falls evaluation completed;Falls prevention discussed Falls evaluation completed;Falls prevention discussed Falls evaluation completed Falls evaluation completed Falls evaluation completed    FALL RISK PREVENTION PERTAINING TO THE HOME:  Any stairs in or around the home? Yes  If so, are there any without handrails? No  Home free of loose throw rugs in walkways, pet beds, electrical cords, etc? Yes  Adequate lighting in your home to reduce risk of falls? Yes   ASSISTIVE DEVICES UTILIZED TO PREVENT FALLS:  Life alert? No  Use of a cane, walker or w/c? No  Grab bars in the bathroom? No  Shower chair or bench in shower? No  Elevated toilet seat or a handicapped toilet? No   TIMED UP AND GO:  Was the test performed? Yes .  Length of time to ambulate 10 feet: 1 min.   Gait slow and steady without use of assistive device  Cognitive Function:        12/13/2022    2:51 PM  6CIT Screen  What Year? 0 points  What month? 0 points  What time? 0 points  Count back from 20 0 points  Months in reverse 0 points  Repeat phrase 0 points  Total Score 0 points    Immunizations Immunization History  Administered Date(s) Administered   Fluad  Quad(high Dose 65+) 12/13/2022   Influenza Split 08/05/2009   Influenza, High Dose Seasonal PF 07/18/2017, 08/09/2018   Influenza, Seasonal, Injecte, Preservative Fre 12/01/2012   Influenza,inj,Quad PF,6+ Mos 08/13/2014, 07/05/2015   Influenza-Unspecified 08/09/2018   PFIZER(Purple Top)SARS-COV-2 Vaccination 01/05/2020, 02/02/2020, 08/27/2020   Pneumococcal Conjugate-13 04/04/2016   Pneumococcal Polysaccharide-23 08/26/2004, 12/26/2005, 03/10/2014, 06/22/2019   Tdap 01/03/2013    TDAP status: Up to date  Flu Vaccine status: Up to date  Pneumococcal vaccine status: Up to date  Covid-19 vaccine status: Completed vaccines  Qualifies for Shingles Vaccine? No   Zostavax completed No   Shingrix Completed?: No.    Education has been provided regarding the importance of this vaccine. Patient has been advised to call insurance company to determine out of pocket expense if they have not yet received this vaccine. Advised may also receive vaccine at local pharmacy or Health Dept. Verbalized  acceptance and understanding.  Screening Tests Health Maintenance  Topic Date Due   Zoster Vaccines- Shingrix (1 of 2) Never done   COLONOSCOPY (Pts 45-59yr Insurance coverage will need to be confirmed)  Never done   COLON CANCER SCREENING ANNUAL FOBT  06/17/2010   Lung Cancer Screening  11/05/2013   DEXA SCAN  Never done   Diabetic kidney evaluation - Urine ACR  04/13/2016   OPHTHALMOLOGY EXAM  03/17/2019   LIPID PANEL  03/04/2022   MAMMOGRAM  05/02/2022   COVID-19 Vaccine (4 - 2023-24 season) 07/09/2022   HEMOGLOBIN A1C  12/01/2022   DTaP/Tdap/Td (2 - Td or Tdap) 01/03/2023   FOOT EXAM  08/31/2023   Diabetic kidney evaluation - eGFR measurement  12/08/2023   Medicare Annual Wellness (AWV)  12/14/2023   Pneumonia Vaccine 72 Years old  Completed   INFLUENZA VACCINE  Completed   Hepatitis C Screening  Completed   HPV VACCINES  Aged Out    Health Maintenance  Health Maintenance Due  Topic  Date Due   Zoster Vaccines- Shingrix (1 of 2) Never done   COLONOSCOPY (Pts 45-452yrInsurance coverage will need to be confirmed)  Never done   COLON CANCER SCREENING ANNUAL FOBT  06/17/2010   Lung Cancer Screening  11/05/2013   DEXA SCAN  Never done   Diabetic kidney evaluation - Urine ACR  04/13/2016   OPHTHALMOLOGY EXAM  03/17/2019   LIPID PANEL  03/04/2022   MAMMOGRAM  05/02/2022   COVID-19 Vaccine (4 - 2023-24 season) 07/09/2022   HEMOGLOBIN A1C  12/01/2022    Colorectal cancer screening: Type of screening: Colonoscopy. Completed patient due. Repeat every 10 years  Mammogram status: Ordered 12/13/2022. Pt provided with contact info and advised to call to schedule appt.    Lung Cancer Screening: (Low Dose CT Chest recommended if Age 362-80ears, 30 pack-year currently smoking OR have quit w/in 15years.) does not qualify.   Lung Cancer Screening Referral: N/A  Additional Screening:  Hepatitis C Screening: does not qualify; Completed 03/14/2019  Vision Screening: Recommended annual ophthalmology exams for early detection of glaucoma and other disorders of the eye. Is the patient up to date with their annual eye exam?  No  Who is the provider or what is the name of the office in which the patient attends annual eye exams? [facility] If pt is not established with a provider, would they like to be referred to a provider to establish care? No .   Dental Screening: Recommended annual dental exams for proper oral hygiene  Community Resource Referral / Chronic Care Management: CRR required this visit?  No   CCM required this visit?  No      Plan:     I have personally reviewed and noted the following in the patient's chart:   Medical and social history Use of alcohol, tobacco or illicit drugs  Current medications and supplements including opioid prescriptions. Patient is not currently taking opioid prescriptions. Functional ability and status Nutritional status Physical  activity Advanced directives List of other physicians Hospitalizations, surgeries, and ER visits in previous 12 months Vitals Screenings to include cognitive, depression, and falls Referrals and appointments  In addition, I have reviewed and discussed with patient certain preventive protocols, quality metrics, and best practice recommendations. A written personalized care plan for preventive services as well as general preventive health recommendations were provided to patient.     JaKerin PernaCMDown [facility] Community Hospital 12/13/2022   Nurse Notes: Face-To-Face Visit  Ms. Doster ,  Thank you for taking time to come for your Medicare Wellness Visit. I appreciate your ongoing commitment to your health goals. Please review the following plan we discussed and let me know if I can assist you in the future.   These are the goals we discussed:  Goals   None     This is a list of the screening recommended for you and due dates:  Health Maintenance  Topic Date Due   Zoster (Shingles) Vaccine (1 of 2) Never done   Colon Cancer Screening  Never done   Stool Blood Test  06/17/2010   Screening for Lung Cancer  11/05/2013   DEXA scan (bone density measurement)  Never done   Yearly kidney health urinalysis for diabetes  04/13/2016   Eye exam for diabetics  03/17/2019   Lipid (cholesterol) test  03/04/2022   Mammogram  05/02/2022   COVID-19 Vaccine (4 - 2023-24 season) 07/09/2022   Hemoglobin A1C  12/01/2022   DTaP/Tdap/Td vaccine (2 - Td or Tdap) 01/03/2023   Complete foot exam   08/31/2023   Yearly kidney function blood test for diabetes  12/08/2023   Medicare Annual Wellness Visit  12/14/2023   Pneumonia Vaccine  Completed   Flu Shot  Completed   Hepatitis C Screening: USPSTF Recommendation to screen - Ages 18-79 yo.  Completed   HPV Vaccine  Aged Out

## 2022-12-13 NOTE — Progress Notes (Signed)
  Care Coordination  Outreach Note  12/13/2022 Name: [name] MRN: [id number] DOB: 23-Jul-1951   Care Coordination Outreach Attempts: An unsuccessful telephone outreach was attempted today to offer the patient information about available care coordination services as a benefit of their health plan.   Follow Up Plan:  Additional outreach attempts will be made to offer the patient care coordination information and services.   Encounter Outcome:  No Answer  [place]  Direct Dial: [phone]

## 2022-12-13 NOTE — Telephone Encounter (Signed)
Patient having issues affording Trelegy and albuterol inhaler.  Was attempting to run a claim to see how much her ventolin inhaler was, but it looks like it's filled at the pharmacy (will call to check on copay). Generic albuterol inhalers are available (depending on her coverage).   Patients Trelegy is about $45 per month. [employer] has a program she should be eligible to enroll with (I can mail her the application), or she can apply for the [place] with Social Security to see if she can get her copays lowered at the pharmacy.   Will reach out to patient regarding options.

## 2022-12-13 NOTE — Patient Instructions (Addendum)
Thank you, Ms.[name] for allowing [place] to provide your care today. Today we discussed   You breast cancer history Your hypertension Your COPD and inhaler need You anxiety and care coordination.    I have ordered the following labs for you:  Lab Orders         Fecal occult blood, imunochemical         Hemoglobin A1c         BMP8+Anion Gap         Microalbumin / Creatinine Urine Ratio       I will call if any are abnormal. All of your labs can be accessed through "My Chart".  I have place a referrals to  care coordination and to the behavioral counselor for your care management and your anxiety .    I have ordered the following medication/changed the following medications:  Stop taking the losartan and the hydrochlorothiazide alone. There is a combination pill that you should pick up from [facility]. Take this combination pill (losartan-hydrochlorothiazide) in the morning and at bedtime I refilled all the other medications - please pick them up. Prioritize the combination pill above and the Duloxetine for your anxiety  My Chart Access: [email or web address]  Please follow-up in in 4 weeks.  Please make sure to arrive 15 minutes prior to your next appointment. If you arrive late, you may be asked to reschedule.    We look forward to seeing you next time. Please call our clinic at [phone] if you have any questions or concerns. The best time to call is Monday-Friday from 9am-4pm, but there is someone available 24/7. If after hours or the weekend, call the main hospital number and ask for the Internal Medicine Resident On-Call. If you need medication refills, please notify your pharmacy one week in advance and they will send [place] a request.   Thank you for letting us take part in your care. Wishing you the best!  Romana Juniper, MD 12/13/2022, 9:44 AM [name] Internal [facility] Resident, PGY-1

## 2022-12-13 NOTE — Assessment & Plan Note (Addendum)
Discussed cessation given COPD and exacerbations in the past year. [name] is also aware how this can make her anxiety worse. She declined pharmacologic aides or counseling for cessation today.

## 2022-12-13 NOTE — Progress Notes (Signed)
Subjective:  CC: follow up  HPI:  [name] is a 72 y.o. female with a past medical history stated below and presents today for chronic condition follow up. Patient is also in distress because she has been able to palpate a firm nodule, present before, but that now feels bigger to her. It was barely palpable in 2021, for which she was evaluated with a mammogram but because of her history of breast cancer she is afraid. She is also having difficulty affording all her medications, especially her COPD inhalers. She has received samples from the clinic in the past and is requesting some if possible. Please see problem based assessment and plan for additional details.  Past Medical History:  Diagnosis Date   Acute sinusitis 01/09/2019   Anxiety    Arthritis    "fingers, knees" (08/16/2018)   Asthma    Atopic dermatitis 03/20/2018   Axillary hidradenitis suppurativa 10/13/2018   Cancer of right breast ([place]) 1991   s/p lumpectomy, chemotherapy and radiation therapy in 1991. Mammogram in 2007 was normal.   Constipated    h/o   COPD (chronic obstructive pulmonary disease) (HCC)    History of multiple hospital admissions for exercabation    COPD exacerbation ([place]) 01/01/2019   COPD with acute exacerbation ([place]) 01/14/2019   COPD with exacerbation ([place]) 04/06/2009   Qualifier: Diagnosis of  By: Eyvonne Mechanic MD, Vijay     Depression    Diarrhea    h/o   GERD (gastroesophageal reflux disease)    Headache    "a few times/month" (08/16/2018   Heart murmur 10/05/11   "first time I ever heard I had one was today"   History of breast cancer 12/01/2012   Pt with h/o breast CA s/p lumpectomy with chemo/radiation in 1991. Pt mammogram 2011 was unremarkable. Had CT chest 10/2012 in ED for SOB and showed spiculated nodule with lymph node. 12/04/12: Birads 2; repeat diagnostic mammogram in 1 year.     Hyperlipidemia    Hypertension    Lower extremity edema 09/21/2018   Obesity    Personal history of  chemotherapy    Personal history of radiation therapy    Pneumonia    "couple times in the last 10-15 yrs" (08/16/2018)   QT prolongation 08/08/2014   Seasonal allergies 02/25/2017   Shortness of breath 10/05/11   "at rest; lying down; w/exertion"   Sigmoid diverticulitis 80/2008   Tobacco abuse    Type 2 diabetes mellitus ([place]) 05/14/2009   Type II diabetes mellitus ([place])     Current Outpatient Medications on File Prior to Visit  Medication Sig Dispense Refill   acetaminophen (TYLENOL) 325 MG tablet Take 2 tablets (650 mg total) by mouth every 6 (six) hours as needed for mild pain (or Fever >/= 101).     Blood Glucose Monitoring Suppl (ONETOUCH VERIO) w/Device KIT Use to check blood sugar once daily. (E11.8) 1 kit 0   fluticasone (FLONASE) 50 MCG/ACT nasal spray Place 1 spray into both nostrils daily. 11.1 mL 3   loratadine (CLARITIN) 10 MG tablet Take 1 tablet (10 mg total) by mouth daily. 30 tablet 11   [DISCONTINUED] fluticasone-salmeterol (ADVAIR DISKUS) 250-50 MCG/ACT AEPB Inhale 1 puff into the lungs in the morning and at bedtime. 60 each 1   No current facility-administered medications on file prior to visit.    Family History  Problem Relation Age of Onset   Cancer Mother    Breast cancer Daughter 31  Social History   Socioeconomic History   Marital status: Married    Spouse name: Not on file   Number of children: Not on file   Years of education: 12   Highest education level: Not on file  Occupational History    Employer: UNEMPLOYED  Tobacco Use   Smoking status: Every Day    Packs/day: 0.50    Years: 45.00    Total pack years: 22.50    Types: Cigarettes   Smokeless tobacco: Never   Tobacco comments:    1/3/pk every 2-3 days   Vaping Use   Vaping Use: Never used  Substance and Sexual Activity   Alcohol use: Yes    Alcohol/week: 4.0 standard drinks of alcohol    Types: 4 Cans of beer per week    Comment: 08/16/2018 "weekends only"   Drug use: No   Sexual  activity: Yes  Other Topics Concern   Not on file  Social History Narrative   Lived in [place] with her husband.   Takes care of 3 grand children.   Trying to find a job, has financial difficulties.      Social Determinants of Health   Financial Resource Strain: Not on file  Food Insecurity: No Food Insecurity (08/31/2022)   Hunger Vital Sign    Worried About Running Out of Food in the Last Year: Never true    Ran Out of Food in the Last Year: Never true  Transportation Needs: No Transportation Needs (09/07/2022)   PRAPARE - Hydrologist (Medical): No    Lack of Transportation (Non-Medical): No  Physical Activity: Not on file  Stress: Not on file  Social Connections: Not on file  Intimate Partner Violence: Not At Risk (08/31/2022)   Humiliation, Afraid, Rape, and Kick questionnaire    Fear of Current or Ex-Partner: No    Emotionally Abused: No    Physically Abused: No    Sexually Abused: No    Review of Systems: ROS negative except for what is noted on the assessment and plan.  Objective:   Vitals:   12/13/22 0828 12/13/22 0937  BP: (!) 150/112 (!) 165/82  Pulse: (!) 105 88  Temp: 98.4 F (36.9 C)   TempSrc: Oral   SpO2: 100%   Weight: 162 lb 8 oz (73.7 kg)   Height: 5' 5.98" (1.676 m)     Physical Exam: Constitutional: anxious-appearing woman sitting in chair, in mild emotional distress HENT: normocephalic atraumatic, mucous membranes moist Eyes: conjunctiva non-erythematous Neck: supple Cardiovascular: regular rate and rhythm, no m/r/g Pulmonary/Chest: normal work of breathing on room air, lungs clear to auscultation bilaterally Abdominal: soft, non-tender, non-distended MSK: normal bulk and tone Neurological: alert & oriented x 3, moving all extremities, normal gait Skin: warm and dry, L lateral gluteal nodule without fluctuance, erythema, or purulence noted. Psych: Anxious mood and affect    Physical Exam Exam conducted  with a chaperone present.  Chest:       Comments: ~5-57m firm, fixed nodule in the mid/upper L quadrant of the R breast above prior surgical scar. No LAD, edema, tenderness, swelling, crepitus, pitting, or discharge. Discoloration over breast. Lymphadenopathy:     Upper Body:     Right upper body: No supraclavicular, axillary or pectoral adenopathy.     Left upper body: No supraclavicular, axillary or pectoral adenopathy.      Assessment & Plan:   Breast nodule Patient with a personal history of breast cancer in 1001 s/p lumpectomy,  chemotherapy, and radiation presenting with a worsening R breast nodule. The nodule feels firm, without edema, tenderness, discharge. On exam, there is a 5-64m firm, fixed nodule in the mid/upper L quadrant of the R breast above surgical scar that does not feel cystic or fibrous in nature. Patient underwent mammogram and [place] 2021 for a similar concern resulting in a oil cyst. However, given the increase in size, distress to patient and history of breast cancer, discussed with patient the value of a diagnostic mammogram. Patient is in agreement. -Diagnostic mammogram and [place] placed   [place]D [place]ation [place] Patient with COPD and multiple exacerbations in the past year. Initially on triple therapy with Trelegy Ellipta; however this medication is too expensive for patient . She was previously given a couple of clinic samples, then transitioned to a couple of combination inhalers that she could not afford. Pt reports she has been out of inhaler for some time now. Denies increased sputum production, new cough, dyspnea. She continues to smoke.   On exam she is CTAB, with no expiratory wheezing. Provided with clinic sample of Breo Ellipta and ordered spiriva while we try to get message to pharmacy contact, [name] to help [place] medication assistance. -Memory DanceEllipta sample in clinic today -Spiriva sent to pharmacy -F/u with CZane Heraldfor medication  assistance -Albuterol PRN -smoking cessation counseled  Anxiety    12/13/2022   11:34 AM 03/04/2021    9:25 AM 12/11/2020    2:23 PM 03/14/2019    8:43 AM  GAD 7 : Generalized Anxiety Score  Nervous, Anxious, on Edge '1 1 2 1  '$ Control/stop worrying '2 1 2 1  '$ Worry too much - different things '1 2 2 1  '$ Trouble relaxing '2 1 2 '$ 0  Restless '1 1 2 '$ 0  Easily annoyed or irritable '2 2 2 1  '$ Afraid - awful might happen 2 0 1 1  Total GAD 7 Score '11 8 13 5  '$ Anxiety Difficulty Somewhat difficult Not difficult at all Somewhat difficult    Patient has been off of Duloxetine since November, recently taking some left over medications. Her anxiety has been worsened by her worries about the breast lump and not being able to afford her medications. Patient is distraught, tearful, and anxious today. GAD7 score worse compared to the last recorded.   Plan: -Restart patient on duloxetine as she mentioned this has helped her in the past -Refer patient to integrated behavioral health today    Hypertension, benign essential, goal below 140/90 Patient with uncontrolled HTN. She has difficulty affording medications AND has problems remembering taking them. She denies chest pain, SOB, HA, or changes in vision today.  Vitals:   12/13/22 0828 12/13/22 0937  BP: (!) 150/112 (!) 165/82   Last BMP on 1/30 with K of 3.  BP improved the second time, but still not at goal. The last time her medications were refilled was in Oct 2023. Refilled amlodipine and metropolol tartrate, and combined Losartan-HCTZ 24-12.5 mg BID. Discussed with [name] to assess whether we could help with medications assistance; patient is to let [place] what her co-pays are with her pharmacy card and go from there -return in 4 weeks for BP check and med adherence - BMP today for K check  Healthcare maintenance Patient lost past FOBT kit, one given today See diagnostic mammogram under breast nodule Discussed need for eye exam; patient will  contact own eye doctor Influenza vaccine today  Tobacco abuse Discussed cessation  given COPD and exacerbations in the past year. [name] is also aware how this can make her anxiety worse. She declined pharmacologic aides or counseling for cessation today.    Return in about 4 weeks (around 01/10/2023) for HTN, anxiety, and breast nodule follow up.  Patient seen with Dr. Veatrice Kells, MD [facility] Internal Medicine Program - PGY-1 12/13/22

## 2022-12-13 NOTE — Assessment & Plan Note (Signed)
Patient with a personal history of breast cancer in 1001 s/p lumpectomy, chemotherapy, and radiation presenting with a worsening R breast nodule. The nodule feels firm, without edema, tenderness, discharge. On exam, there is a 5-25m firm, fixed nodule in the mid/upper L quadrant of the R breast above surgical scar that does not feel cystic or fibrous in nature. Patient underwent mammogram and [place]ain 2021 for a similar concern resulting in a oil cyst. However, given the increase in size, distress to patient and history of breast cancer, discussed with patient the value of a diagnostic mammogram. Patient is in agreement. -Diagnostic mammogram and [place] placed

## 2022-12-13 NOTE — Assessment & Plan Note (Signed)
    12/13/2022   11:34 AM 03/04/2021    9:25 AM 12/11/2020    2:23 PM 03/14/2019    8:43 AM  GAD 7 : Generalized Anxiety Score  Nervous, Anxious, on Edge '1 1 2 1  '$ Control/stop worrying '2 1 2 1  '$ Worry too much - different things '1 2 2 1  '$ Trouble relaxing '2 1 2 '$ 0  Restless '1 1 2 '$ 0  Easily annoyed or irritable '2 2 2 1  '$ Afraid - awful might happen 2 0 1 1  Total GAD 7 Score '11 8 13 5  '$ Anxiety Difficulty Somewhat difficult Not difficult at all Somewhat difficult    Patient has been off of Duloxetine since November, recently taking some left over medications. Her anxiety has been worsened by her worries about the breast lump and not being able to afford her medications. Patient is distraught, tearful, and anxious today. GAD7 score worse compared to the last recorded.   Plan: -Restart patient on duloxetine as she mentioned this has helped her in the past -Refer patient to integrated behavioral health today

## 2022-12-13 NOTE — Assessment & Plan Note (Signed)
Patient lost past FOBT kit, one given today See diagnostic mammogram under breast nodule Discussed need for eye exam; patient will contact own eye doctor Influenza vaccine today

## 2022-12-13 NOTE — Progress Notes (Signed)
  Care Coordination   Note   12/13/2022 Name: [name] MRN: [id number] DOB: December 10, 1950  [name] is a 72 y.o. year old female who sees Nani Gasser, MD for primary care. I reached out to [name] by phone today to offer care coordination services.  [name] was given information about Care Coordination services today including:   The Care Coordination services include support from the care team which includes your Nurse Coordinator, Clinical Social Worker, or Pharmacist.  The Care Coordination team is here to help remove barriers to the health concerns and goals most important to you. Care Coordination services are voluntary, and the patient may decline or stop services at any time by request to their care team member.   Care Coordination Consent Status: Patient agreed to services and verbal consent obtained.   Follow up plan:  Telephone appointment with care coordination team member scheduled for:  12/17/22 with RNCM and 12/20/22 with SW   Encounter Outcome:  Pt. Scheduled  [place]  Direct Dial: [phone]

## 2022-12-13 NOTE — Assessment & Plan Note (Signed)
Patient with COPD and multiple exacerbations in the past year. Initially on triple therapy with Trelegy Ellipta; however this medication is too expensive for patient . She was previously given a couple of clinic samples, then transitioned to a couple of combination inhalers that she could not afford. Pt reports she has been out of inhaler for some time now. Denies increased sputum production, new cough, dyspnea. She continues to smoke.   On exam she is CTAB, with no expiratory wheezing. Provided with clinic sample of Breo Ellipta and ordered spiriva while we try to get message to pharmacy contact, [name], to help [place] with medication assistance. Memory Dance Ellipta sample in clinic today -Spiriva sent to pharmacy -F/u with Zane Herald for medication assistance -Albuterol PRN -smoking cessation counseled

## 2022-12-14 ENCOUNTER — Telehealth: Payer: Self-pay

## 2022-12-14 LAB — BMP8+ANION GAP
Anion Gap: 14 mmol/L (ref 10.0–18.0)
BUN/Creatinine Ratio: 5 — ABNORMAL LOW (ref 12–28)
BUN: 3 mg/dL — ABNORMAL LOW (ref 8–27)
CO2: 26 mmol/L (ref 20–29)
Calcium: 9.1 mg/dL (ref 8.7–10.3)
Chloride: 100 mmol/L (ref 96–106)
Creatinine, Ser: 0.58 mg/dL (ref 0.57–1.00)
Glucose: 78 mg/dL (ref 70–99)
Potassium: 4.1 mmol/L (ref 3.5–5.2)
Sodium: 140 mmol/L (ref 134–144)
eGFR: 97 mL/min/{1.73_m2} (ref >59)

## 2022-12-14 LAB — HEMOGLOBIN A1C
Est. average glucose Bld gHb Est-mCnc: 103 mg/dL
Hgb A1c MFr Bld: 5.2 % (ref 4.8–5.6)

## 2022-12-14 NOTE — Progress Notes (Signed)
   Care Guide Note  12/14/2022 Name: [name] MRN: [id number] DOB: 01-11-1951  Referred by: Nani Gasser, MD Reason for referral : Care Coordination (Outreach to schedule with pharm d Malachy Mood )   [name] is a 72 y.o. year old female who is a primary care patient of Nani Gasser, MD. [name] was referred to the pharmacist for assistance related to DM.    An unsuccessful telephone outreach was attempted today to contact the patient who was referred to the pharmacy team for assistance with medication assistance. Additional attempts will be made to contact the patient.   Noreene Larsson, [place],  [place] [address] Direct Dial: [phone] [email or web address]

## 2022-12-15 ENCOUNTER — Ambulatory Visit
Admission: RE | Admit: 2022-12-15 | Discharge: 2022-12-15 | Disposition: A | Discharge status: Home / Self Care | Payer: Medicare Other | Source: Ambulatory Visit | Attending: Internal Medicine | Admitting: Internal Medicine

## 2022-12-15 DIAGNOSIS — C50111 Malignant neoplasm of central portion of right female breast: Secondary | ICD-10-CM

## 2022-12-15 DIAGNOSIS — Z853 Personal history of malignant neoplasm of breast: Secondary | ICD-10-CM | POA: Diagnosis not present

## 2022-12-15 LAB — MICROALBUMIN / CREATININE URINE RATIO
Creatinine, Urine: 252 mg/dL
Microalb/Creat Ratio: 5 mg/g creat (ref 0–29)
Microalbumin, Urine: 12.9 ug/mL

## 2022-12-15 NOTE — Progress Notes (Signed)
Internal Medicine Clinic Attending  I saw and evaluated the patient.  I personally confirmed the key portions of the history and exam documented by Dr.  Altamease Oiler  and I reviewed pertinent patient test results.  The assessment, diagnosis, and plan were formulated together and I agree with the documentation in the resident's note.   I reviewed MMG and breast U/S results from earlier today, which showed: 1. No evidence of malignancy within either breast. 2. Benign dystrophic calcification at the lumpectomy site in the upper RIGHT breast, corresponding to the palpable area of concern.

## 2022-12-16 ENCOUNTER — Ambulatory Visit (INDEPENDENT_AMBULATORY_CARE_PROVIDER_SITE_OTHER): Payer: Self-pay | Admitting: Licensed Clinical Social Worker

## 2022-12-16 ENCOUNTER — Ambulatory Visit: Payer: Medicare Other | Admitting: Family

## 2022-12-16 DIAGNOSIS — F419 Anxiety disorder, unspecified: Secondary | ICD-10-CM

## 2022-12-16 NOTE — BH Specialist Note (Signed)
Integrated Behavioral Health Initial In-Person Visit  MRN: [id number] Name: [name]  Number of [place] Clinician visits: 1- Initial Visit  Session Start time: 1000    Session End time: 5009  Total time in minutes: 15   Types of Service: Telephone visit and Health & Behavioral Assessment/Intervention    Warm Hand Off Completed.        Assessment: [place] introduced self and patient consulted to services. Patient verified her DOB and addressed. Patient stated she needed to reschedule appointment because she was assisting with her husband. Patient next appointment is 02/20 at 10 am. Patient decline wanted to self harm or harm others. Patient stated she has a major support system and dog named bambi to live for.   Milus Height, MSW, [place]  Internal Medicine Center Direct [phone]  Fax [phone] Main Office Phone: [phone] [address] [place], [place] [address] Website: [place], [place]

## 2022-12-16 NOTE — Telephone Encounter (Signed)
Reached out to patient regarding inhaler affordability.   Informed patient I would mail a [place] application to her home for Trelegy pap. Suggested she reach out to [facility] and apply for LIS for medication cost help in general.   Also informed patient the pharmacy filled her albuterol inhaler (generic) and the copay is $8. Patient is appreciative and will be picking up this inhaler in the meantime.

## 2022-12-17 ENCOUNTER — Ambulatory Visit: Payer: Self-pay

## 2022-12-17 NOTE — Patient Outreach (Signed)
  Care Coordination   12/17/2022 Name: [name] MRN: [id number] DOB: 11-25-1950   Care Coordination Outreach Attempts:  An unsuccessful telephone outreach was attempted today to offer the patient information about available care coordination services as a benefit of their health plan.   Follow Up Plan:  Additional outreach attempts will be made to offer the patient care coordination information and services.   Encounter Outcome:  No Answer   Care Coordination Interventions:  No, not indicated     Lazaro Arms RN, BSN, [facility]   Phone: [phone]

## 2022-12-20 ENCOUNTER — Telehealth: Payer: Self-pay

## 2022-12-20 ENCOUNTER — Ambulatory Visit: Payer: Self-pay | Admitting: Licensed Clinical Social Worker

## 2022-12-20 NOTE — Patient Instructions (Signed)
Visit Information  Thank you for taking time to visit with me today. Please don't hesitate to contact me if I can be of assistance to you before our next scheduled telephone appointment.  Following are the goals we discussed today:    Our next appointment is by telephone on 01/04/23 at 10:30 AM   Please call the care guide team at [phone] if you need to cancel or reschedule your appointment.   If you are experiencing a Mental Health or [place] or need someone to talk to, please go to [facility] [place] [phone])   Following is a copy of your full plan of care:   Interventions: LCSW spoke via phone today with client about client needs Discussed program support for client with nursing, social work and pharmacy Discussed mood of client. She said she sometimes gets depressed or anxious.  She was recently referred to [facility] Client said she had recent mammogram and results were negative .  She said she felt relieved that mammogram results were negative. Discussed family support. She said her spouse is supportive. He helps with cooking meals for client and helps with transport needs of client Reviewed ambulation of client. She spoke of knee pain issues. She said she was not using a device to help her walk. Reviewed medication procurement. Reviewed appetite of client. She said she has reduced appetite. Reviewed breathing of client. She said she has a nebulizer and uses nebulizer occasionally if needed. She also has an inhaler to use as needed. Client agreed for LCSW to call her in 3 weeks to further discuss her SW needs at that time  Ms. Rheaume was given information about Care Management services by the embedded care coordination team including:  Care Management services include personalized support from designated clinical staff supervised by her physician, including individualized plan of  care and coordination with other care providers 24/7 contact phone numbers for assistance for urgent and routine care needs. The patient may stop CCM services at any time (effective at the end of the month) by phone call to the office staff.  Patient agreed to services and verbal consent obtained.    [name].Trendon Zaring MSW, [place] [facility] representative [facility] Care Management [phone]

## 2022-12-20 NOTE — Progress Notes (Signed)
Patient called and aware. No changes in management.

## 2022-12-20 NOTE — Telephone Encounter (Signed)
        Patient  visited [facility] on 12/07/2022  for Cellulitis, unspecified cellulitis site.   Telephone encounter attempt :  1st  A HIPAA compliant voice message was left requesting a return call.  Instructed patient to call back at [phone].   [place] [facility] Care Guide   ??[name]  ?? [phone]   Website: [email or web address]  [email or web address]

## 2022-12-20 NOTE — Patient Outreach (Signed)
  Care Coordination   Initial Visit Note   12/20/2022 Name: [name] MRN: [id number] DOB: 1951/03/13  [name] is a 72 y.o. year old female who sees Nani Gasser, MD for primary care. I spoke with  [name] by phone today.  What matters to the patients health and wellness today? Client spoke of financial needs, medical needs and issues in dealing with anxiety     Goals Addressed               This Visit's Progress     patient spoke of financial needs, medical needs  and issues in dealing with anxiety (pt-stated)        Interventions: LCSW spoke via phone today with client about client needs Discussed program support for client with nursing, social work and pharmacy Discussed mood of client. She said she sometimes gets depressed or anxious.  She was recently referred to [facility] Client said she had recent mammogram and results were negative .  She said she felt relieved that mammogram results were negative. Discussed family support. She said her spouse is supportive. He helps with cooking meals for client and helps with transport needs of client Reviewed ambulation of client. She spoke of knee pain issues. She said she was not using a device to help her walk. Reviewed medication procurement. Reviewed appetite of client. She said she has reduced appetite. Reviewed breathing of client. She said she has a nebulizer and uses nebulizer occasionally if needed. She also has an inhaler to use as needed. Client agreed for LCSW to call her in 3 weeks to further discuss her SW needs at that time     SDOH assessments and interventions completed:  Yes  SDOH Interventions Today    Flowsheet Row Most Recent Value  SDOH Interventions   Depression Interventions/Treatment  Currently on Treatment  Physical Activity Interventions Other (Comments)  [has some walking difficulty]  Stress Interventions Other (Comment)  [client has been referred to  [facility] for counseling support]        Care Coordination Interventions:  Yes, provided   Interventions Today    Flowsheet Row Most Recent Value  Chronic Disease Discussed/Reviewed   Chronic disease discussed/reviewed during today's visit Other  [anxiety]  General Interventions   General Interventions Discussed/Reviewed General Interventions Discussed  [discussed program support with RN, LCSW and Pharmacist]  Education Interventions   Education Provided Provided Verbal Education  Provided Verbal Education On Mental Health/Coping with Illness  Mental Health Interventions   Mental Health Discussed/Reviewed Mental Health Discussed        Follow up plan: Follow up call scheduled for 01/04/23 at 10:30 AM    Encounter Outcome:  Pt. Visit Completed   Norva Riffle.Hawke Villalpando MSW, [place] [facility] representative [facility] Care Management [phone]

## 2022-12-23 NOTE — Progress Notes (Signed)
Internal Medicine Clinic Attending  Case and documentation of Dr. Simeon Craft  at the time of the visit reviewed.  I reviewed the AWV findings.  I agree with the assessment, diagnosis, and plan of care documented in the AWV note.

## 2022-12-28 ENCOUNTER — Ambulatory Visit (INDEPENDENT_AMBULATORY_CARE_PROVIDER_SITE_OTHER): Payer: Self-pay | Admitting: Licensed Clinical Social Worker

## 2022-12-28 DIAGNOSIS — E785 Hyperlipidemia, unspecified: Secondary | ICD-10-CM

## 2022-12-28 DIAGNOSIS — E1169 Type 2 diabetes mellitus with other specified complication: Secondary | ICD-10-CM

## 2022-12-28 NOTE — BH Specialist Note (Signed)
Patient no-showed today's appointment; appointment was for Telephone visit at 10:00am  [facility] from here on out)  attempted patient  via Telephone number [phone]    [facility] contacted patient from telephone number [phone]. [place] left a  VM.   Patient will need to reschedule appointment by calling Internal medicine center [phone].  Milus Height, MSW, [place]  Internal Medicine Center Direct Dial:[phone]  Fax [phone] Main Office Phone: [phone] [address] [place], [place] [address] Website: [place], [place]

## 2023-01-04 ENCOUNTER — Ambulatory Visit: Payer: Self-pay | Admitting: Licensed Clinical Social Worker

## 2023-01-04 NOTE — Patient Outreach (Signed)
  Care Coordination   Follow Up Visit Note   01/04/2023 Name: [name] MRN: [id number] DOB: October 09, 1951  [name] is a 72 y.o. year old female who sees Nani Gasser, MD for primary care. I spoke with  [name] by phone today.  What matters to the patients health and wellness today?  Patient spoke of financial needs, medical needs and of issues in managing anxiety    Goals Addressed               This Visit's Progress     patient spoke of financial needs, medical needs  and issues in dealing with anxiety (pt-stated)        Interventions: LCSW spoke via phone today with client about client needs Discussed program support for client with nursing, social work and pharmacy Discussed mood of client. She said she sometimes gets depressed or anxious.  She was recently referred to [facility]. LCSW spoke today with client about her referral to [place]. LCSW gave client phone number for IBH. Discussed family support. She said her spouse is supportive. He helps with cooking meals for client and helps with transport needs of client Reviewed ambulation of client. She spoke of knee pain issues. She said she was not using a device to help her walk. Reviewed medication procurement. Reviewed appetite of client. She said she has reduced appetite. Reviewed breathing of client. She said she has a nebulizer and uses nebulizer occasionally if needed. She also has an inhaler to use as needed. Provided counseling support for client Client plans to call office of PCP to schedule appointment for client. She is concerned regarding skin issues on her buttocks Encouraged client to call LCSW as needed for SW support at [phone].      SDOH assessments and interventions completed:  Yes  SDOH Interventions Today    Flowsheet Row Most Recent Value  SDOH Interventions   Depression Interventions/Treatment  Currently on Treatment   Physical Activity Interventions Other (Comments)  [has some walking challenges,  has knee pain issue]  Stress Interventions Other (Comment)  [client has stress related to managing medical needs]        Care Coordination Interventions:  Yes, provided   Interventions Today    Flowsheet Row Most Recent Value  Chronic Disease   Chronic disease during today's visit Other  [discussed program support for client]  General Interventions   General Interventions Discussed/Reviewed General Interventions Discussed, [facility] program support with Therapist, sports, Neurosurgeon and Pharmacist]  Education Interventions   Education Provided Provided Education  Provided Verbal Education On [employer], Mental Health/Coping with Illness  [Discussed her referral to [employer]. Gave client phone number for IBH.]  Mental Health Interventions   Mental Health Discussed/Reviewed Anxiety, Coping Strategies  [discussed coping skills of client to help manage stress]  Nutrition Interventions   Nutrition Discussed/Reviewed Nutrition Discussed  [client said she has reduced appetite]        Follow up plan: Follow up call scheduled for 01/31/23 at 11:00 AM    Encounter Outcome:  Pt. Visit Completed   Norva Riffle.Zarai Orsborn MSW, [place] [facility] representative [facility] Care Management [phone]

## 2023-01-04 NOTE — Patient Instructions (Signed)
Visit Information  Thank you for taking time to visit with me today. Please don't hesitate to contact me if I can be of assistance to you before our next scheduled telephone appointment.  Following are the goals we discussed today:    Our next appointment is by telephone on 01/31/23 at 11:00 AM    Please call the care guide team at [phone] if you need to cancel or reschedule your appointment.   If you are experiencing a Mental Health or [place] or need someone to talk to, please go to [facility] [phone])   Following is a copy of your full plan of care:   Interventions: LCSW spoke via phone today with client about client needs Discussed program support for client with nursing, social work and pharmacy Discussed mood of client. She said she sometimes gets depressed or anxious.  She was recently referred to [facility]. LCSW spoke today with client about her referral to [place]. LCSW gave client phone number for IBH. Discussed family support. She said her spouse is supportive. He helps with cooking meals for client and helps with transport needs of client Reviewed ambulation of client. She spoke of knee pain issues. She said she was not using a device to help her walk. Reviewed medication procurement. Reviewed appetite of client. She said she has reduced appetite. Reviewed breathing of client. She said she has a nebulizer and uses nebulizer occasionally if needed. She also has an inhaler to use as needed. Provided counseling support for client Client plans to call office of PCP to schedule appointment for client. She is concerned regarding skin issues on her buttocks Encouraged client to call LCSW as needed for SW support at [phone].  Ms. Skilton was given information about Care Management services by the embedded care coordination team including:  Care  Management services include personalized support from designated clinical staff supervised by her physician, including individualized plan of care and coordination with other care providers 24/7 contact phone numbers for assistance for urgent and routine care needs. The patient may stop CCM services at any time (effective at the end of the month) by phone call to the office staff.  Patient agreed to services and verbal consent obtained.   Norva Riffle.Pairlee Sawtell MSW, [facility] representative [facility] Care Management [phone]

## 2023-01-07 ENCOUNTER — Encounter: Payer: Medicare Other | Admitting: Internal Medicine

## 2023-01-10 ENCOUNTER — Ambulatory Visit (INDEPENDENT_AMBULATORY_CARE_PROVIDER_SITE_OTHER): Payer: Medicare Other

## 2023-01-10 ENCOUNTER — Other Ambulatory Visit: Payer: Medicare Other | Admitting: Pharmacist

## 2023-01-10 ENCOUNTER — Other Ambulatory Visit: Payer: Self-pay

## 2023-01-10 ENCOUNTER — Telehealth: Payer: Self-pay | Admitting: *Deleted

## 2023-01-10 VITALS — BP 144/86 | HR 119 | Temp 99.1°F | Resp 32 | Ht 65.0 in | Wt 150.9 lb

## 2023-01-10 DIAGNOSIS — F419 Anxiety disorder, unspecified: Secondary | ICD-10-CM

## 2023-01-10 DIAGNOSIS — J441 Chronic obstructive pulmonary disease with (acute) exacerbation: Secondary | ICD-10-CM

## 2023-01-10 MED ORDER — DULOXETINE HCL 60 MG PO CPEP: 60.0000 mg | ORAL_CAPSULE | Freq: Every day | ORAL | 3 refills | Status: DC

## 2023-01-10 MED ORDER — IPRATROPIUM-ALBUTEROL 0.5-2.5 (3) MG/3ML IN SOLN
3.0000 mL | Freq: Once | RESPIRATORY_TRACT | Status: AC
  Administered 2023-01-10: 3 mL via RESPIRATORY_TRACT

## 2023-01-10 MED ORDER — SPIRIVA RESPIMAT 2.5 MCG/ACT IN AERS: 2.0000 | INHALATION_SPRAY | Freq: Every day | RESPIRATORY_TRACT | 0 refills | Status: DC

## 2023-01-10 MED ORDER — METHYLPREDNISOLONE SODIUM SUCC 125 MG IJ SOLR
60.0000 mg | Freq: Once | INTRAMUSCULAR | Status: AC
  Administered 2023-01-10: 60 mg via INTRAMUSCULAR

## 2023-01-10 MED ORDER — AZITHROMYCIN 500 MG PO TABS: 500.0000 mg | ORAL_TABLET | Freq: Every day | ORAL | 0 refills | Status: AC

## 2023-01-10 MED ORDER — PREDNISONE 20 MG PO TABS: 40.0000 mg | ORAL_TABLET | Freq: Every day | ORAL | 0 refills | Status: AC

## 2023-01-10 NOTE — Patient Instructions (Addendum)
[name], [name] may qualify for Medicare Extra Help if your 2 person household income is less than $2,485 per month.   You can apply here: [email or web address]  My coworker will call you next week.   Thanks!  Catie [name], PharmD

## 2023-01-10 NOTE — Assessment & Plan Note (Signed)
Patient presents today with 3 days of cough increased from baseline and dyspnea. Says her cough is more productive than usual and the sputum is different in color which she describes as a green tinge. She endorses some nasal congestion. Denies fever, chills, CP, sick contacts. Patient has been out of spiriva for 3 days. Recently got Breo sample in clinic, unclear if she still has this. Using albuterol nebs 4-5x daily.Afebrile satting 100% on RA. On exam has increased wob, audible wheezing. No crackles, tactile fremitus or egophony appreciated. This is consistent with a COPD exacerbation. Patient still smoking 1/2 ppd and suspect it is from med non-adherence 2/2 affordability and due to smoking. Given solumedrol IM '60mg'$  today in clinic and duoneb breathing treatment. Will complete 4 days of prednisone '40mg'$  starting tomorrow. Will also give azithromycin x 3 days. No concern for pneumonia at this time. Given stable vitals do not think ED visit is warranted. Patient instructed to go to ED or call/visit clinic if no improvement of symptoms or if she fevers. -azithromycin '500mg'$  x3 days -IM solumedrol '60mg'$  in clinic, prednisone '40mg'$  x 4 days starting tomorrow -duoneb breathing treatment in clinic -will need to continue smoking cessation conversations -patient needs to be on triple therapy inhalers, pending completion of trelagy assistance application

## 2023-01-10 NOTE — Patient Instructions (Signed)
Thank you, [name] for allowing [place] to provide your care today. Today we discussed :  COPD exacerbation: We are giving you '60mg'$  of steroids called solumedrol through a shot today. You will begin prednisone by mouth for 4 days as of tomorrow. You will take 3 days of azithromycin antibiotic. You are getting assistance through the pharmacy for Spiriva please pick this up. Stopping smoking will be the most beneficial thing for your health and to stop exacerbations. We are doing a duoneb breathing treatment here, continue albuterol at home as needed. Please go to the emergency room if your breathing and cough does not improve, if you get a feve.   I have ordered the following medication/changed the following medications:   Stop the following medications: Medications Discontinued During This Encounter  Medication Reason   Tiotropium Bromide Monohydrate (SPIRIVA RESPIMAT) 2.5 MCG/ACT AERS Reorder     Start the following medications: Meds ordered this encounter  Medications   ipratropium-albuterol (DUONEB) 0.5-2.5 (3) MG/3ML nebulizer solution 3 mL   azithromycin (ZITHROMAX) 500 MG tablet    Sig: Take 1 tablet (500 mg total) by mouth daily for 3 days. Take 1 tablet daily for 3 days.    Dispense:  3 tablet    Refill:  0   predniSONE (DELTASONE) 20 MG tablet    Sig: Take 2 tablets (40 mg total) by mouth daily with breakfast for 4 days.    Dispense:  8 tablet    Refill:  0   Tiotropium Bromide Monohydrate (SPIRIVA RESPIMAT) 2.5 MCG/ACT AERS    Sig: Inhale 2 puffs into the lungs daily.    Dispense:  1 each    Refill:  0   methylPREDNISolone sodium succinate (SOLU-MEDROL) 125 mg/2 mL injection 60 mg     Follow up: 3 months    We look forward to seeing you next time. Please call our clinic at [phone] if you have any questions or concerns. The best time to call is Monday-Friday from 9am-4pm, but there is someone available 24/7. If after hours or the weekend, call the main  hospital number and ask for the Internal Medicine Resident On-Call. If you need medication refills, please notify your pharmacy one week in advance and they will send [place] a request.   Thank you for trusting me with your care. Wishing you the best!   Iona Coach, MD [place]

## 2023-01-10 NOTE — Progress Notes (Signed)
Established Patient Office Visit  Subjective   Patient ID: SAGE KINLAW, female    DOB: 12-19-1950  Age: 72 y.o. MRN: [id number]  Chief Complaint  Patient presents with   Asthma    X 2 days   Cough    Light trace yellow    Ms. Berardino is a 72 y/o female with a pmh outlined below. Please see A&P for HPI information.  Asthma She complains of cough. Her past medical history is significant for asthma.  Cough Her past medical history is significant for asthma.      Review of Systems  Respiratory:  Positive for cough.   All other systems reviewed and are negative.     Objective:     BP (!) 144/86 (BP Location: Left Arm, Patient Position: Sitting, Cuff Size: Normal)   Pulse (!) 119   Temp 99.1 F (37.3 C) (Oral)   Resp (!) 32   Ht '5\' 5"'$  (1.651 m)   Wt 150 lb 14.4 oz (68.4 kg)   SpO2 100% Comment: room air  BMI 25.11 kg/m    Physical Exam Constitutional:      General: She is in acute distress.     Appearance: Normal appearance. She is normal weight. She is ill-appearing. She is not toxic-appearing or diaphoretic.  Eyes:     General: No scleral icterus.    Conjunctiva/sclera: Conjunctivae normal.  Cardiovascular:     Rate and Rhythm: Regular rhythm. Tachycardia present.     Pulses: Normal pulses.     Heart sounds: Normal heart sounds. No murmur heard.    No gallop.  Pulmonary:     Effort: Respiratory distress present.     Breath sounds: No stridor. Wheezing present. No rhonchi or rales.  Musculoskeletal:     Right lower leg: No edema.     Left lower leg: No edema.  Skin:    General: Skin is warm and dry.     Capillary Refill: Capillary refill takes less than 2 seconds.  Neurological:     Mental Status: She is alert.      No results found for any visits on 01/10/23.    The 10-year ASCVD risk score (Arnett DK, et al., 2019) is: 56.9%    Assessment & Plan:   Problem List Items Addressed This Visit       Respiratory   COPD exacerbation  ([place]) (Chronic)    Patient presents today with 3 days of cough increased from baseline and dyspnea. Says her cough is more productive than usual and the sputum is different in color which she describes as a green tinge. She endorses some nasal congestion. Denies fever, chills, CP, sick contacts. Patient has been out of spiriva for 3 days. Recently got Breo sample in clinic, unclear if she still has this. Using albuterol nebs 4-5x daily.Afebrile satting 100% on RA. On exam has increased wob, audible wheezing. No crackles, tactile fremitus or egophony appreciated. This is consistent with a COPD exacerbation. Patient still smoking 1/2 ppd and suspect it is from med non-adherence 2/2 affordability and due to smoking. Given solumedrol IM '60mg'$  today in clinic and duoneb breathing treatment. Will complete 4 days of prednisone '40mg'$  starting tomorrow. Will also give azithromycin x 3 days. No concern for pneumonia at this time. Given stable vitals do not think ED visit is warranted. Patient instructed to go to ED or call/visit clinic if no improvement of symptoms or if she fevers. -azithromycin '500mg'$  x3 days -IM solumedrol '60mg'$  in clinic,  prednisone '40mg'$  x 4 days starting tomorrow -duoneb breathing treatment in clinic -will need to continue smoking cessation conversations -patient needs to be on triple therapy inhalers, pending completion of trelagy assistance application      Relevant Medications   azithromycin (ZITHROMAX) 500 MG tablet   predniSONE (DELTASONE) 20 MG tablet (Start on 01/11/2023)   Tiotropium Bromide Monohydrate (SPIRIVA RESPIMAT) 2.5 MCG/ACT AERS     Other   Anxiety   Relevant Medications   DULoxetine (CYMBALTA) 60 MG capsule   Other Visit Diagnoses     COPD with acute exacerbation ([place])    -  Primary   Relevant Medications   ipratropium-albuterol (DUONEB) 0.5-2.5 (3) MG/3ML nebulizer solution 3 mL (Completed)   azithromycin (ZITHROMAX) 500 MG tablet   predniSONE (DELTASONE) 20 MG  tablet (Start on 01/11/2023)   Tiotropium Bromide Monohydrate (SPIRIVA RESPIMAT) 2.5 MCG/ACT AERS   methylPREDNISolone sodium succinate (SOLU-MEDROL) 125 mg/2 mL injection 60 mg (Completed)       Return in about 3 months (around 04/12/2023).    Iona Coach, MD

## 2023-01-10 NOTE — Progress Notes (Deleted)
COPD Exacerbation in ED 11/30/22  Patient with COPD and multiple exacerbations in the past year. Initially on triple therapy with Trelegy Ellipta; however this medication is too expensive for patient . She was previously given a couple of clinic samples, then transitioned to a couple of combination inhalers that she could not afford. Pt reports she has been out of inhaler for some time now. Denies increased sputum production, new cough, dyspnea. She continues to smoke.    On exam she is CTAB, with no expiratory wheezing. Provided with clinic sample of Breo Ellipta and ordered spiriva while we try to get message to pharmacy contact, Ms. Karle Starch, to help [place] with medication assistance. Memory Dance Ellipta sample in clinic today -Spiriva sent to pharmacy -F/u with Zane Herald for medication assistance -Albuterol PRN -smoking cessation counseled  Reccurent sinusitis Plan 11/23/2022 -Augmentin 875-125 mg BID x 5 days -continue with nasal saline spray -refilled Flonase and Claritin -ENT referral

## 2023-01-10 NOTE — Telephone Encounter (Signed)
Walk-in. Stating she needs a breathing. Audible wheezing and shob when talking noted. Stated this has been going on x 1 week. Added to Dr Dennie Maizes schedule - doctor and nurses informed.

## 2023-01-10 NOTE — Progress Notes (Signed)
Care Coordination Call  Noted that patient is current at [facility] for walk in appointment related to COPD exacerbation. Attempted to call, phone is turned off (as at an appointment).   Reviewed chart. Triple therapy desired, patient was mailed application for Trelegy assistance in February, and was advised to apply for LIS.   Today, was given sample of Breo inhaler. Completed enrollment for [facility] to Home for free supply of Spiriva; called information to [facility]. Unfortunately, appears patient has already used her once per lifetime supply   Contacted patient. She is unsure of she and her husband's total household income. She does report that she has a prescription insurance card. If she has a Medicare Part D plan, she will have to meet $600 out of pocket spend before qualifying for Trelegy assistance; if so, may be easier to apply for [facility] assistance. Patient requested she be called next week she has time to verify household income.   Scheduled call with a coworker as I will be out of the office.   Catie Hedwig Morton, PharmD, [place], [facility] [phone]

## 2023-01-12 NOTE — Progress Notes (Signed)
Internal Medicine Clinic Attending  Case discussed with the resident at the time of the visit.  We reviewed the resident's history and exam and pertinent patient test results.  I agree with the assessment, diagnosis, and plan of care documented in the resident's note.  

## 2023-01-14 ENCOUNTER — Ambulatory Visit: Payer: Self-pay

## 2023-01-14 NOTE — Patient Outreach (Unsigned)
  Care Coordination   Initial Visit Note   01/14/2023 Name: [name] MRN: [id number] DOB: [birth year]  [name] is a 72 y.o. year old female who sees Nani Gasser, MD for primary care. I spoke with  [name] by phone today.  What matters to the patients health and wellness today?  Mrs. Snedeker is feeling alright but is currently on antibiotics for one more day and is using an inhaler and nebulizer for a cold she's been dealing with. According to her, her husband is very supportive. During our conversation, we talked about her medications. She wants to get them in order, particularly her depression and anxiety medication, as she feels it's no longer effective. I suggested that I would send a message to her primary care physician, and she would need to schedule a visit to their office. She agreed to this plan."    Goals Addressed             This Visit's Progress    I want to have my meddication in order and depression medication reevlauated       Care Coordination Interventions: Evaluation of current treatment plan related to the patient's medical issues and patient's adherence to plan as established by provider Advised patient to that I will send her PCP a message that she wants a revealuation of her depression/anxiety medication Reviewed medications with patient and discussed their importance Active listening / Reflection utilized  Emotional Support Provided Problem [place] strategies reviewed         SDOH assessments and interventions completed:  Yes{THN Tip this will not be part of the note when signed-REQUIRED REPORT FIELD DO NOT DELETE (Optional):27901}  SDOH Interventions Today    Flowsheet Row Most Recent Value  SDOH Interventions   Food Insecurity Interventions Intervention Not Indicated  Transportation Interventions Intervention Not Indicated        Care Coordination Interventions:  {INTERVENTIONS:27767} {THN Tip this  will not be part of the note when signed-REQUIRED REPORT FIELD DO NOT DELETE (Optional):27901}  Follow up plan: {CCFOLLOWUP:27768}   Encounter Outcome:  {ENCOUTCOME:27770} {THN Tip this will not be part of the note when signed-REQUIRED REPORT FIELD DO NOT DELETE (Optional):27901}

## 2023-01-15 NOTE — Patient Instructions (Signed)
Visit Information  Thank you for taking time to visit with me today. Please don't hesitate to contact me if I can be of assistance to you.   Following are the goals we discussed today:   Goals Addressed             This Visit's Progress    I want to have my medication in order and depression medication reevlauated       Patient Goals/Self Care Activities: -Patient/Caregiver will self-administer medications as prescribed as evidenced by self-report/primary caregiver report  -Patient/Caregiver will attend all scheduled provider appointments as evidenced by clinician review of documented attendance to scheduled appointments and patient/caregiver report -Patient/Caregiver will call pharmacy for medication refills as evidenced by patient report and review of pharmacy fill history as appropriate -Patient/Caregiver will call provider office for new concerns or questions as evidenced by review of documented incoming telephone call notes and patient report -Patient/Caregiver verbalizes understanding of plan -Patient/Caregiver will focus on medication adherence by taking medications as prescribed  Active listening / Reflection utilized  Emotional Support Provided Problem [place] strategies reviewed Evaluation of current treatment plan related to the patient's medical issues and patient's adherence to plan as established by provider Advised patient to that I will send her PCP a message that she wants a revealuation of her depression/anxiety medication Reviewed medications with patient and discussed their importance        Our next appointment is by telephone on 01/28/23 at 115 pm  Please call the care guide team at [phone] if you need to cancel or reschedule your appointment.   If you are experiencing a Mental Health or [place] or need someone to talk to, please call 1-800-273-TALK (toll free, 24 hour hotline)  Patient verbalizes understanding of  instructions and care plan provided today and agrees to view in [place]. Active MyChart status and patient understanding of how to access instructions and care plan via MyChart confirmed with patient.      Lazaro Arms RN, BSN, [facility]   Phone: [phone]

## 2023-01-15 NOTE — Patient Outreach (Deleted)
  Care Coordination   Initial Visit Note   01/14/2023 Name: [name] MRN: [id number] DOB: 18-Jul-1951  [name] is a 72 y.o. year old female who sees Nani Gasser, MD for primary care. I spoke with  [name] by phone today.  What matters to the patients health and wellness today?  I spoke with [name] this morning, and she stated that she was getting over a cold. We reviewed her health conditions, and I asked her what was her concern today. She noted that she feels anxious and gets depressed sometimes. After reviewing her medications, she said that she would like to have her anxiety /depression medication reevaluated because she thinks that it is no longer effective. I informed her that I would send her PCP a message about her needs. A Message was sent to get the appointment schedule.     Goals Addressed             This Visit's Progress    I want to have my medication in order and depression medication reevlauated       Patient Goals/Self Care Activities: -Patient/Caregiver will self-administer medications as prescribed as evidenced by self-report/primary caregiver report  -Patient/Caregiver will attend all scheduled provider appointments as evidenced by clinician review of documented attendance to scheduled appointments and patient/caregiver report -Patient/Caregiver will call pharmacy for medication refills as evidenced by patient report and review of pharmacy fill history as appropriate -Patient/Caregiver will call provider office for new concerns or questions as evidenced by review of documented incoming telephone call notes and patient report -Patient/Caregiver verbalizes understanding of plan -Patient/Caregiver will focus on medication adherence by taking medications as prescribed  Active listening / Reflection utilized  Emotional Support Provided Problem [place] strategies reviewed Evaluation of current treatment plan related to  the patient's medical issues and patient's adherence to plan as established by provider Advised patient to that I will send her PCP a message that she wants a revealuation of her depression/anxiety medication Reviewed medications with patient and discussed their importance        SDOH assessments and interventions completed:  Yes{THN Tip this will not be part of the note when signed-REQUIRED REPORT FIELD DO NOT DELETE (Optional):27901}  SDOH Interventions Today    Flowsheet Row Most Recent Value  SDOH Interventions   Food Insecurity Interventions Intervention Not Indicated  Transportation Interventions Intervention Not Indicated        Care Coordination Interventions:  Yes, provided {THN Tip this will not be part of the note when signed-REQUIRED REPORT FIELD DO NOT DELETE (Optional):27901} Interventions Today    Flowsheet Row Most Recent Value  Chronic Disease   Chronic disease during today's visit Hypertension (HTN)  General Interventions   General Interventions Discussed/Reviewed General Interventions Discussed  Education Interventions   Education Provided Provided Education  Provided Verbal Education On When to see the doctor, Medication  Mental Health Interventions   Mental Health Discussed/Reviewed Anxiety  Pharmacy Interventions   Pharmacy Dicussed/Reviewed Pharmacy Topics Discussed, Medication Adherence        Follow up plan: Follow up call scheduled for 01/28/23  115 pm    Encounter Outcome:  Pt. Visit Completed {THN Tip this will not be part of the note when signed-REQUIRED REPORT FIELD DO NOT DELETE (Optional):27901}  Lazaro Arms RN, BSN, [facility]   Phone: [phone]

## 2023-01-18 ENCOUNTER — Encounter: Payer: Medicare Other | Admitting: Student

## 2023-01-19 ENCOUNTER — Telehealth: Payer: Self-pay

## 2023-01-19 ENCOUNTER — Other Ambulatory Visit: Payer: Medicare Other

## 2023-01-19 NOTE — Telephone Encounter (Signed)
-----   Message from Darlina Guys, [facility] sent at 01/19/2023  9:52 AM EDT ----- Good morning!  Could we start the application process for patient assistance through AZ&Me for [facility], please?  Thank you!  Darlina Guys, PharmD, DPLA

## 2023-01-19 NOTE — Progress Notes (Signed)
   01/19/2023 Name: [name] MRN: [id number] DOB: 01-14-1951  Chief Complaint  Patient presents with   Medication Problem   [name] is a 72 y.o. year old female who presented for a telephone visit.   They were referred to the pharmacist by  Dr. Simeon Craft  for assistance in managing medication access.   Patient is participating in a Managed Medicaid Plan:  No  Subjective/Objective: Patient with multiple COPD exacerbations, indicating triple therapy treatment; but these inhalers are not affordable with her drug coverage.  Medication Access/Adherence -Patient reports affordability concerns with their medications: Yes , unable to afford maintenance inhaler copays -Patient reports access/transportation concerns to their pharmacy: No  -Patient reports adherence concerns with their medications:  Yes  Currently using Advair she had at home as maintenance inhaler and down to 20 puffs remaining.   - March 4th note stating given Breo inhaler sample in clinic that day, but it appears that was actually given 2/5 - Previously mailed PAP application for Trelegy in February of this year - Patient also expresses concern with hypertension and anxiety/depression control  Assessment/Plan:  -Patient does have drug coverage through Medicare and able to verify household income for 2 is <300% poverty level ($59,160) -Recommend triple therapy with Breztri taking 2 puffs twice daily, because PAP for Trelegy requires $600 out of pocket expenditure - Forwarding chart to CPh[facility] to initiate application process through AZ&Me - Contacting Dr. Berneice Heinrich office to see if they have another sample patient could obtain, so she is not without maintenance between current Advair inhaler and arrival of Breztri - Patient not certain of eligibility for Medicare Extra Help (household income requirement (<$30,660) but is going to verify prior to follow up - Referral in with behavioral health - Will  look at BP control and medications at follow-up  Follow Up Plan: Telephone visit in 1 week to check on status of PAP application and see if patient able to verify eligibility for Extra Help.  Will also discuss BP control and medications.  Darlina Guys, PharmD, DPLA

## 2023-01-19 NOTE — Telephone Encounter (Signed)
Mailing AZ&ME application to patients home for [place].

## 2023-01-26 ENCOUNTER — Telehealth: Payer: Self-pay

## 2023-01-26 ENCOUNTER — Other Ambulatory Visit: Payer: Medicare Other

## 2023-01-26 NOTE — Patient Outreach (Signed)
  Care Coordination   01/26/2023 Name: [name] MRN: [id number] DOB: 05-07-1951   Care Coordination Outreach Attempts:  An unsuccessful telephone outreach was attempted today to offer the patient information about available care coordination services as a benefit of their health plan.   Follow Up Plan:  Additional outreach attempts will be made to offer the patient care coordination information and services.   Encounter Outcome:  No Answer   Care Coordination Interventions:  No, not indicated    SIG Lazaro Arms RN, BSN, [facility]   Phone: [phone]

## 2023-01-26 NOTE — Progress Notes (Unsigned)
   01/26/2023  Patient ID: [name], female   DOB: 21-May-1951, 72 y.o.   MRN: [id number]  Unsuccessful outreach attempt for scheduled telephone follow-up visit with patient to check on status of Breztri PAP application and blood pressure control.  Called and left voicemail x2 for patient to give me a call back.  She has a scheduled care coordination appointment with RN 2/22, so I will route to her as FYI.  Darlina Guys, PharmD, DPLA

## 2023-01-28 ENCOUNTER — Ambulatory Visit: Payer: Self-pay

## 2023-01-28 NOTE — Patient Outreach (Signed)
  Care Coordination   Follow Up Visit Note   01/28/2023 Name: DANIAL FLIPPIN MRN: [id number] DOB: Mar 27, 1951  [name] is a 72 y.o. year old female who sees Nani Gasser, MD for primary care. I spoke with  [name] by phone today.  What matters to the patients health and wellness today?  I had a conversation with Mrs. Levick, and she shared that she is doing well. She mentioned that she has been preoccupied and had to go out of town, but she assured me that she has not had any issues with her medications and has organized them properly. I reminded her of an appointment that was scheduled for her, but she expressed difficulty in keeping track of everything. I suggested that she call and reschedule the appointment, and she acknowledged that she understood. She felt that she did not need to continue the calls but knew that if she needed assistance in the future, she would let her physician know.    Goals Addressed             This Visit's Progress    COMPLETED: I want to have my medication in order and depression medication reevlauated       Patient Goals/Self Care Activities: -Patient/Caregiver will self-administer medications as prescribed as evidenced by self-report/primary caregiver report  -Patient/Caregiver will attend all scheduled provider appointments as evidenced by clinician review of documented attendance to scheduled appointments and patient/caregiver report -Patient/Caregiver will call pharmacy for medication refills as evidenced by patient report and review of pharmacy fill history as appropriate -Patient/Caregiver will call provider office for new concerns or questions as evidenced by review of documented incoming telephone call notes and patient report -Patient/Caregiver verbalizes understanding of plan -Patient/Caregiver will focus on medication adherence by taking medications as prescribed  Active listening / Reflection utilized  Emotional  Support Provided Problem [place] strategies reviewed Evaluation of current treatment plan related to the patient's medical issues and patient's adherence to plan as established by provider Advised patient to that I will send her PCP a message that she wants a revealuation of her depression/anxiety medication Reviewed medications with patient and discussed their importance        SDOH assessments and interventions completed:  No     Care Coordination Interventions:  Yes, provided   Interventions Today    Flowsheet Row Most Recent Value  Chronic Disease   Chronic disease during today's visit Other  [Medications]  General Interventions   General Interventions Discussed/Reviewed General Interventions Discussed  Pharmacy Interventions   Pharmacy Dicussed/Reviewed Medications and their functions        Follow up plan: No further intervention required.   Encounter Outcome:  Pt. Visit Completed   Lazaro Arms RN, BSN, [facility]   Phone: [phone]

## 2023-01-28 NOTE — Patient Instructions (Signed)
Visit Information  Thank you for taking time to visit with me today. Please don't hesitate to contact me if I can be of assistance to you.   Following are the goals we discussed today:   Goals Addressed             This Visit's Progress    COMPLETED: I want to have my medication in order and depression medication reevlauated       Patient Goals/Self Care Activities: -Patient/Caregiver will self-administer medications as prescribed as evidenced by self-report/primary caregiver report  -Patient/Caregiver will attend all scheduled provider appointments as evidenced by clinician review of documented attendance to scheduled appointments and patient/caregiver report -Patient/Caregiver will call pharmacy for medication refills as evidenced by patient report and review of pharmacy fill history as appropriate -Patient/Caregiver will call provider office for new concerns or questions as evidenced by review of documented incoming telephone call notes and patient report -Patient/Caregiver verbalizes understanding of plan -Patient/Caregiver will focus on medication adherence by taking medications as prescribed  Active listening / Reflection utilized  Emotional Support Provided Problem [place] strategies reviewed Evaluation of current treatment plan related to the patient's medical issues and patient's adherence to plan as established by provider Advised patient to that I will send her PCP a message that she wants a revealuation of her depression/anxiety medication Reviewed medications with patient and discussed their importance         If you are experiencing a Mental Health or [place] or need someone to talk to, please call 1-800-273-TALK (toll free, 24 hour hotline)  Patient verbalizes understanding of instructions and care plan provided today and agrees to view in [place]. Active MyChart status and patient understanding of how to access instructions and care plan  via MyChart confirmed with patient.     No further follow up required:  Lazaro Arms RN, BSN, [facility]   Phone: [phone]

## 2023-01-31 ENCOUNTER — Ambulatory Visit: Payer: Self-pay | Admitting: Licensed Clinical Social Worker

## 2023-01-31 NOTE — Patient Outreach (Signed)
  Care Coordination   Follow Up Visit Note   01/31/2023 Name: [name] MRN: [id number] DOB: 02/13/1951  [name] is a 72 y.o. year old female who sees Nani Gasser, MD for primary care. I spoke with  [name] by phone today.  What matters to the patients health and wellness today? Patient is concerned with health needs, financial needs, and managing anxiety issues    Goals Addressed               This Visit's Progress     patient spoke of financial needs, medical needs  and issues in dealing with anxiety (pt-stated)        Interventions:  LCSW talked via phone with [name] today. LCSW talked with client about program support Client said she wanted to set up appointment with MD at medical clinic she attends. She wants to talk with MD about eye care issues and feet care issues Reviewed mood issues. Client said she feels her mood is basically stable at present. She likes to spend time in her craft shop as a way to manage anxiety or stress issues. Reviewed medication procurement. Reviewed walking challenges of client Reviewed support for client. She said she has support from her spouse and from her daughter. Encouraged client to call LCSW as needed for SW support at [phone].         SDOH assessments and interventions completed:  Yes  SDOH Interventions Today    Flowsheet Row Most Recent Value  SDOH Interventions   Depression Interventions/Treatment  Counseling  Physical Activity Interventions Other (Comments)  [client may have walking challenges]  Stress Interventions Provide Counseling  [stress related to managing medical needs]       Care Coordination Interventions:  Yes, provided   Interventions Today    Flowsheet Row Most Recent Value  Chronic Disease   Chronic disease during today's visit Other  [spoke with client about her current needs]  General Interventions   General Interventions Discussed/Reviewed General  Interventions Discussed, [employer]  [discussed program support]  Education Interventions   Education Provided Provided Education  Provided Verbal Education On [employer] said she plans to set up appointment with MD at clinic where she receives medical care. she wants to talk with MD about eye care and feet issues]  Mental Health Interventions   Mental Health Discussed/Reviewed Anxiety, Coping Strategies  [discussed stress management. she likes working in her craft shop to help manage [place] Dicussed/Reviewed Affording Medications        Follow up plan: Follow up call scheduled for 03/14/23 at 2:00 PM     Encounter Outcome:  Pt. Visit Completed   Norva Riffle.Mable Lashley MSW, [place] [facility] representative [facility] Care Management [phone]

## 2023-01-31 NOTE — Patient Instructions (Signed)
Visit Information  Thank you for taking time to visit with me today. Please don't hesitate to contact me if I can be of assistance to you.   Following are the goals we discussed today:   Goals Addressed               This Visit's Progress     patient spoke of financial needs, medical needs  and issues in dealing with anxiety (pt-stated)        Interventions:  LCSW talked via phone with [name] today. LCSW talked with client about program support Client said she wanted to set up appointment with MD at medical clinic she attends. She wants to talk with MD about eye care issues and feet care issues Reviewed mood issues. Client said she feels her mood is basically stable at present. She likes to spend time in her craft shop as a way to manage anxiety or stress issues. Reviewed medication procurement. Reviewed walking challenges of client Reviewed support for client. She said she has support from her spouse and from her daughter. Encouraged client to call LCSW as needed for SW support at [phone].         Our next appointment is by telephone on 03/14/23 at 2:00 PM   Please call the care guide team at [phone] if you need to cancel or reschedule your appointment.   If you are experiencing a Mental Health or [place] or need someone to talk to, please go to [facility] [place] [phone])   The patient verbalized understanding of instructions, educational materials, and care plan provided today and DECLINED offer to receive copy of patient instructions, educational materials, and care plan.   The patient has been provided with contact information for the care management team and has been advised to call with any health related questions or concerns.   Norva Riffle.Shane Melby MSW, [place] [facility] representative [facility] Care Management [phone]

## 2023-03-03 ENCOUNTER — Ambulatory Visit (INDEPENDENT_AMBULATORY_CARE_PROVIDER_SITE_OTHER): Payer: Medicare Other | Admitting: Student

## 2023-03-03 DIAGNOSIS — J019 Acute sinusitis, unspecified: Secondary | ICD-10-CM | POA: Diagnosis not present

## 2023-03-03 MED ORDER — FLUTICASONE PROPIONATE 50 MCG/ACT NA SUSP: 1.0000 | Freq: Every day | NASAL | 3 refills | Status: DC

## 2023-03-03 MED ORDER — AZELASTINE HCL 0.1 % NA SOLN: 2.0000 | Freq: Two times a day (BID) | NASAL | 0 refills | Status: DC

## 2023-03-03 MED ORDER — FEXOFENADINE HCL 60 MG PO TABS: 60.0000 mg | ORAL_TABLET | Freq: Two times a day (BID) | ORAL | 0 refills | Status: DC

## 2023-03-03 NOTE — Patient Instructions (Signed)
For nasal saline irrigation, you can buy a kit at any major drugstore.  Make sure to use bottled distilled water from the grocery store for this.  Tap water is not safe to use for nasal saline irrigation.  For more comfortable experience, add salt and baking soda to lukewarm water and irrigate each nostril.  This after visit summary is an important review of tests, referrals, and medication changes that were discussed during your visit. If you have questions or concerns, call [phone]. Outside of clinic business hours, call the main hospital at [phone] and ask the operator for the on-call internal medicine resident.   Ernesta Amble MD 03/03/2023, 3:15 PM

## 2023-03-03 NOTE — Progress Notes (Signed)
   I connected with  [name] on 03/03/23 by telephone and verified that I am speaking with the correct person using two identifiers.   I discussed the limitations of evaluation and management by telemedicine. The patient expressed understanding and agreed to proceed.  CC: sinus problems  This is a telephone encounter between [employer] and [name] on 03/03/2023 for sinus problems. The visit was conducted with the patient located at home and [name] at [facility]. The patient's identity was confirmed using their DOB and current address. The patient has consented to being evaluated through a telephone encounter and understands the associated risks (an examination cannot be done and the patient may need to come in for an appointment) / benefits (allows the patient to remain at home, decreasing exposure to coronavirus). I personally spent 21 minutes on medical discussion.   HPI:  [name] is a 72 y.o. with PMH as below.   Sinuses draining, nose and ears stopped up, no taste or smell. Ongoing for two weeks.  No new or worsening cough or sputum production.  She has some itchy eyes.  Review of Systems  Constitutional:  Negative for chills and fever.  HENT:  Positive for congestion and sinus pain. Negative for sore throat.   Respiratory:  Negative for cough and sputum production.   Cardiovascular:  Negative for chest pain.   Please see A&P for assessment of the patient's acute and chronic medical conditions.   Assessment & Plan:   Acute sinusitis History consistent with acute rhinosinusitis.  Lack of increased cough or sputum production, shortness of breath reassuring against pneumonia or COPD exacerbation.  Will treat as a viral or allergic etiology for now.  Start with intranasal fluticasone and azelastine, with nasal saline irrigation. May try Allegra in case of allergic etiology.    Patient discussed with Dr. Raymond Gurney MD [facility] Internal Medicine  PGY-1 Pager: [phone] Date 03/03/2023  Time 3:24 PM

## 2023-03-03 NOTE — Assessment & Plan Note (Addendum)
History consistent with acute rhinosinusitis.  Lack of increased cough or sputum production, shortness of breath reassuring against pneumonia or COPD exacerbation.  Will treat as a viral or allergic etiology for now.  Start with intranasal fluticasone and azelastine, with nasal saline irrigation. May try Allegra in case of allergic etiology.

## 2023-03-04 ENCOUNTER — Other Ambulatory Visit: Payer: Self-pay

## 2023-03-04 MED ORDER — AZELASTINE HCL 0.1 % NA SOLN: 2.0000 | Freq: Two times a day (BID) | NASAL | 0 refills | Status: DC

## 2023-03-04 NOTE — Telephone Encounter (Signed)
Incoming fax from pharmacy Medication:Azelastine Message to prescriber: requesting a 90 day supply

## 2023-03-07 ENCOUNTER — Encounter (HOSPITAL_COMMUNITY): Payer: Self-pay | Admitting: *Deleted

## 2023-03-07 ENCOUNTER — Ambulatory Visit (HOSPITAL_COMMUNITY)
Admission: EM | Admit: 2023-03-07 | Discharge: 2023-03-07 | Disposition: A | Discharge status: Home / Self Care | Payer: Medicare Other | Attending: Family Medicine | Admitting: Family Medicine

## 2023-03-07 ENCOUNTER — Other Ambulatory Visit: Payer: Self-pay

## 2023-03-07 ENCOUNTER — Ambulatory Visit (INDEPENDENT_AMBULATORY_CARE_PROVIDER_SITE_OTHER): Payer: Medicare Other

## 2023-03-07 DIAGNOSIS — M25561 Pain in right knee: Secondary | ICD-10-CM | POA: Diagnosis not present

## 2023-03-07 DIAGNOSIS — J441 Chronic obstructive pulmonary disease with (acute) exacerbation: Secondary | ICD-10-CM | POA: Diagnosis not present

## 2023-03-07 DIAGNOSIS — M1711 Unilateral primary osteoarthritis, right knee: Secondary | ICD-10-CM | POA: Diagnosis not present

## 2023-03-07 LAB — BASIC METABOLIC PANEL
Anion gap: 9 (ref 5–15)
BUN: 5 mg/dL — ABNORMAL LOW (ref 8–23)
CO2: 26 mmol/L (ref 22–32)
Calcium: 8.8 mg/dL — ABNORMAL LOW (ref 8.9–10.3)
Chloride: 103 mmol/L (ref 98–111)
Creatinine, Ser: 0.76 mg/dL (ref 0.44–1.00)
GFR, Estimated: >60 mL/min (ref >60)
Glucose, Bld: 74 mg/dL (ref 70–99)
Potassium: 3.6 mmol/L (ref 3.5–5.1)
Sodium: 138 mmol/L (ref 135–145)

## 2023-03-07 LAB — CBC
HCT: 38.6 % (ref 36.0–46.0)
Hemoglobin: 12.7 g/dL (ref 12.0–15.0)
MCH: 29.6 pg (ref 26.0–34.0)
MCHC: 32.9 g/dL (ref 30.0–36.0)
MCV: 90 fL (ref 80.0–100.0)
Platelets: 339 10*3/uL (ref 150–400)
RBC: 4.29 MIL/uL (ref 3.87–5.11)
RDW: 14.9 % (ref 11.5–15.5)
WBC: 5.9 10*3/uL (ref 4.0–10.5)
nRBC: 0 % (ref 0.0–0.2)

## 2023-03-07 LAB — POCT FASTING CBG KUC MANUAL ENTRY: POCT Glucose (KUC): 82 mg/dL (ref 70–99)

## 2023-03-07 MED ORDER — TRIAMCINOLONE ACETONIDE 40 MG/ML IJ SUSP
40.0000 mg | Freq: Once | INTRAMUSCULAR | Status: AC
  Administered 2023-03-07: 40 mg via INTRAMUSCULAR

## 2023-03-07 MED ORDER — PREDNISONE 20 MG PO TABS: 40.0000 mg | ORAL_TABLET | Freq: Every day | ORAL | 0 refills | Status: AC

## 2023-03-07 MED ORDER — TRIAMCINOLONE ACETONIDE 40 MG/ML IJ SUSP
INTRAMUSCULAR | Status: AC
  Filled 2023-03-07: qty 1

## 2023-03-07 MED ORDER — ALBUTEROL SULFATE (2.5 MG/3ML) 0.083% IN NEBU
INHALATION_SOLUTION | RESPIRATORY_TRACT | Status: AC
  Filled 2023-03-07: qty 3

## 2023-03-07 MED ORDER — ALBUTEROL SULFATE (2.5 MG/3ML) 0.083% IN NEBU
2.5000 mg | INHALATION_SOLUTION | Freq: Once | RESPIRATORY_TRACT | Status: AC
  Administered 2023-03-07: 2.5 mg via RESPIRATORY_TRACT

## 2023-03-07 MED ORDER — ALBUTEROL SULFATE (2.5 MG/3ML) 0.083% IN NEBU: 2.5000 mg | INHALATION_SOLUTION | RESPIRATORY_TRACT | 0 refills | Status: DC | PRN

## 2023-03-07 MED ORDER — DOXYCYCLINE HYCLATE 100 MG PO CAPS: 100.0000 mg | ORAL_CAPSULE | Freq: Two times a day (BID) | ORAL | 0 refills | Status: AC

## 2023-03-07 NOTE — ED Triage Notes (Signed)
Pt reports she fell down her inside stairs yesterday and now has pain Rt knee. Pt is also wheezing the patient reports she uses a Neb at home.

## 2023-03-07 NOTE — ED Provider Notes (Addendum)
[facility]    CSN: [id number] Arrival date & time: 03/07/23  4098      History   Chief Complaint Chief Complaint  Patient presents with   Fall   Knee Pain   Wheezing   Cough    HPI [name] is a 72 y.o. female.    Fall  Knee Pain Wheezing Associated symptoms: cough   Cough Associated symptoms: wheezing    Here for right knee pain.  Yesterday she fell onto her right knee when she missed a step.  Since that time it has been hurting.  She does have some baseline swelling of her right knee and history of knee arthritis.   She has a history of asthma and COPD.  She states about 2 weeks ago she started having a stuffy nose and some cough.  No fever at any point.  In the last 5 to 7 days she has begun having more congestion and some wheezing.  She does not have any medicine for her nebulizer at home.  She had a telemedicine visit on April 25.  She was prescribed Astelin nasal spray at that time and Allegra was suggested  She has been worsening instead of improving in the last few days.  She does have a history of diabetes but states she has not been checking her sugars.  Her last A1c was 5.2 in February.  She is currently not on any medications for the diabetes.  Past Medical History:  Diagnosis Date   Acute sinusitis 01/09/2019   Anxiety    Arthritis    "fingers, knees" (08/16/2018)   Asthma    Atopic dermatitis 03/20/2018   Axillary hidradenitis suppurativa 10/13/2018   Cancer of right breast (HCC) 1991   s/p lumpectomy, chemotherapy and radiation therapy in 1991. Mammogram in 2007 was normal.   Constipated    h/o   COPD (chronic obstructive pulmonary disease) (HCC)    History of multiple hospital admissions for exercabation    COPD exacerbation (HCC) 01/01/2019   COPD with acute exacerbation (HCC) 01/14/2019   COPD with exacerbation (HCC) 04/06/2009   Qualifier: Diagnosis of  By: Comer Locket MD, Vijay     Depression    Diarrhea    h/o   GERD  (gastroesophageal reflux disease)    Headache    "a few times/month" (08/16/2018   Heart murmur 10/05/11   "first time I ever heard I had one was today"   History of breast cancer 12/01/2012   Pt with h/o breast CA s/p lumpectomy with chemo/radiation in 1991. Pt mammogram 2011 was unremarkable. Had CT chest 10/2012 in ED for SOB and showed spiculated nodule with lymph node. 12/04/12: Birads 2; repeat diagnostic mammogram in 1 year.     Hyperlipidemia    Hypertension    Lower extremity edema 09/21/2018   Obesity    Personal history of chemotherapy    Personal history of radiation therapy    Pneumonia    "couple times in the last 10-15 yrs" (08/16/2018)   QT prolongation 08/08/2014   Seasonal allergies 02/25/2017   Shortness of breath 10/05/11   "at rest; lying down; w/exertion"   Sigmoid diverticulitis 80/2008   Tobacco abuse    Type 2 diabetes mellitus (HCC) 05/14/2009   Type II diabetes mellitus [facility])     Patient Active Problem List   Diagnosis Date Noted   Cellulitis of buttock, left 12/13/2022   Facial edema 08/31/2022   Facial cellulitis 08/30/2022   Age-related osteoporosis without  current pathological fracture  03/06/2021   Healthcare maintenance 03/06/2021   Chronic hip pain, right 06/22/2019   Diabetic neuropathy (HCC) 05/21/2019   Acute sinusitis 01/09/2019   Type 2 diabetes mellitus with complication, without long-term current use of insulin (HCC) 12/26/2018   Chronic sinusitis    COPD exacerbation (HCC) 12/24/2014   Dyslipidemia associated with type 2 diabetes mellitus (HCC) 06/14/2014   Breast nodule 12/01/2012   Primary osteoarthritis of right knee 09/29/2012   Anxiety 12/30/2009   Tobacco abuse 04/06/2009   Hypertension, benign essential, goal below 140/90 04/06/2009    Past Surgical History:  Procedure Laterality Date   ABDOMINAL HYSTERECTOMY     ANTERIOR CERVICAL DECOMP/DISCECTOMY FUSION  2012   "Dr. [name]  put plate in; did something to my vertebrae"    BACK SURGERY     BREAST LUMPECTOMY Right 1991   DOBUTAMINE STRESS ECHO  08/2004   Inferior ischemia, normal LV systolic function, no significant CAD   MINOR IRRIGATION AND DEBRIDEMENT OF WOUND Right 09/04/2022   Procedure: IRRIGATION AND DEBRIDEMENT OF NASAL WOUND;  Surgeon: Scarlette Ar, MD;  Location: [facility] OR;  Service: ENT;  Laterality: Right;    OB History   No obstetric history on file.      Home Medications    Prior to Admission medications   Medication Sig Start Date End Date Taking? Authorizing Provider  albuterol (PROVENTIL) (2.5 MG/3ML) 0.083% nebulizer solution Take 3 mLs (2.5 mg total) by nebulization every 4 (four) hours as needed for wheezing or shortness of breath. 03/07/23  Yes Saed Hudlow, Janace Aris, MD  doxycycline (VIBRAMYCIN) 100 MG capsule Take 1 capsule (100 mg total) by mouth 2 (two) times daily for 7 days. 03/07/23 03/14/23 Yes Rafeal Skibicki, Janace Aris, MD  predniSONE (DELTASONE) 20 MG tablet Take 2 tablets (40 mg total) by mouth daily with breakfast for 5 days. 03/07/23 03/12/23 Yes Zenia Resides, MD  acetaminophen (TYLENOL) 325 MG tablet Take 2 tablets (650 mg total) by mouth every 6 (six) hours as needed for mild pain (or Fever >/= 101). 08/23/22   Marolyn Haller, MD  albuterol (VENTOLIN HFA) 108 (90 Base) MCG/ACT inhaler Inhale 2 puffs into the lungs every 6 (six) hours as needed for wheezing or shortness of breath. 12/13/22   [name], MD  amLODipine (NORVASC) 10 MG tablet Take 1 tablet (10 mg total) by mouth daily. 12/13/22 12/13/23  [name], MD  azelastine (ASTELIN) 0.1 % nasal spray Place 2 sprays into both nostrils 2 (two) times daily. Use in each nostril as directed 03/04/23   [name], MD  Blood Glucose Monitoring Suppl [facility] VERIO) w/Device KIT Use to check blood sugar once daily. (E11.8) Patient not taking: Reported on 01/14/2023 03/28/19   Storm Frisk, MD  DULoxetine (CYMBALTA) 60 MG capsule Take 1 capsule (60 mg total) by  mouth daily. 01/10/23 01/10/24  Willette Cluster, MD  fexofenadine [facility] ALLERGY) 60 MG tablet Take 1 tablet (60 mg total) by mouth 2 (two) times daily. 03/03/23   [name], MD  fluticasone (FLONASE) 50 MCG/ACT nasal spray Place 1 spray into both nostrils daily. 03/03/23 03/02/24  [name], MD  loratadine (CLARITIN) 10 MG tablet Take 1 tablet (10 mg total) by mouth daily. 11/23/22   Rana Snare, DO  losartan-hydrochlorothiazide (HYZAAR) 50-12.5 MG tablet Take 1 tablet by mouth in the morning and at bedtime. 12/13/22 12/13/23  [name], MD  metoprolol tartrate (LOPRESSOR) 25 MG tablet Take 1 tablet (25 mg total) by mouth 2 (two)  times daily. 12/13/22 12/13/23  [name], MD  simvastatin (ZOCOR) 40 MG tablet Take 1 tablet (40 mg total) by mouth every evening. 12/13/22 01/12/23  [name], MD  Tiotropium Bromide Monohydrate (SPIRIVA RESPIMAT) 2.5 MCG/ACT AERS Inhale 2 puffs into the lungs daily. Patient not taking: Reported on 01/19/2023 01/10/23   Willette Cluster, MD  fluticasone-salmeterol (ADVAIR DISKUS) 250-50 MCG/ACT AEPB Inhale 1 puff into the lungs in the morning and at bedtime. 08/23/22 08/23/22  Marolyn Haller, MD    Family History Family History  Problem Relation Age of Onset   Cancer Mother        unknown   Breast cancer Daughter 16   Breast cancer Daughter        68s   BRCA 1/2 Neg Hx     Social History Social History   Tobacco Use   Smoking status: Every Day    Packs/day: 0.50    Years: 45.00    Additional pack years: 0.00    Total pack years: 22.50    Types: Cigarettes   Smokeless tobacco: Never   Tobacco comments:    1/2 pk per day  Vaping Use   Vaping Use: Never used  Substance Use Topics   Alcohol use: Yes    Alcohol/week: 4.0 standard drinks of alcohol    Types: 4 Cans of beer per week    Comment: 08/16/2018 "weekends only"   Drug use: No     Allergies   Ace inhibitors   Review of Systems Review of Systems   Respiratory:  Positive for cough and wheezing.      Physical Exam Triage Vital Signs ED Triage Vitals  Enc Vitals Group     BP 03/07/23 0854 (!) 150/89     Pulse Rate 03/07/23 0854 (!) 104     Resp 03/07/23 0854 20     Temp 03/07/23 0854 99.9 F (37.7 C)     Temp src --      SpO2 03/07/23 0854 95 %     Weight --      Height --      Head Circumference --      Peak Flow --      Pain Score 03/07/23 0853 10     Pain Loc --      Pain Edu? --      Excl. in GC? --    No data found.  Updated Vital Signs BP (!) 150/89   Pulse (!) 104   Temp 99.9 F (37.7 C)   Resp 20   SpO2 98%   Visual Acuity Right Eye Distance:   Left Eye Distance:   Bilateral Distance:    Right Eye Near:   Left Eye Near:    Bilateral Near:     Physical Exam Vitals reviewed.  Constitutional:      General: She is not in acute distress.    Appearance: She is not ill-appearing, toxic-appearing or diaphoretic.  HENT:     Nose: Congestion present.     Mouth/Throat:     Mouth: Mucous membranes are moist.  Eyes:     Extraocular Movements: Extraocular movements intact.     Conjunctiva/sclera: Conjunctivae normal.     Pupils: Pupils are equal, round, and reactive to light.  Cardiovascular:     Rate and Rhythm: Normal rate and regular rhythm.     Heart sounds: No murmur heard. Pulmonary:     Effort: No respiratory distress.     Breath sounds: No stridor. No rales.  Comments: There are expiratory wheezes bilaterally with good air movement Musculoskeletal:     Cervical back: Neck supple.  Lymphadenopathy:     Cervical: No cervical adenopathy.  Skin:    Capillary Refill: Capillary refill takes less than 2 seconds.     Coloration: Skin is not jaundiced or pale.  Neurological:     General: No focal deficit present.     Mental Status: She is alert and oriented to person, place, and time.     Comments: While I was obtaining history from the patient, she had times had trouble with word choice and  finishing her thoughts and sentences.     Psychiatric:        Behavior: Behavior normal.      UC Treatments / Results  Labs (all labs ordered are listed, but only abnormal results are displayed) Labs Reviewed  CBC  BASIC METABOLIC PANEL  POCT FASTING CBG [facility] MANUAL ENTRY    EKG   Radiology DG Knee AP/LAT W/Sunrise Right  Result Date: 03/07/2023 CLINICAL DATA:  Trauma, fall, pain EXAM: RIGHT KNEE 3 VIEWS COMPARISON:  01/14/2017 FINDINGS: No fracture or dislocation is seen. Degenerative changes are noted with bony spurs in medial, lateral and patellofemoral compartments with interval progression. There is no significant effusion. IMPRESSION: No recent fracture or dislocation is seen in right knee. Degenerative changes are noted with interval progression. Electronically Signed   By: Ernie Avena M.D.   On: 03/07/2023 09:55    Procedures Procedures (including critical care time)  Medications Ordered in UC Medications  albuterol (PROVENTIL) (2.5 MG/3ML) 0.083% nebulizer solution 2.5 mg (2.5 mg Nebulization Given 03/07/23 1010)  triamcinolone acetonide (KENALOG-40) injection 40 mg (40 mg Intramuscular Given 03/07/23 1013)    Initial Impression / Assessment and Plan / UC Course  I have reviewed the triage vital signs and the nursing notes.  Pertinent labs & imaging results that were available during my care of the patient were reviewed by me and considered in my medical decision making (see chart for details).      She stated to me that she has been prescribed prednisone a few days ago, but that she could not find it.  I cannot find that she was recently prescribed any steroids.  Knee x-ray does not show any fracture.  There are degenerative changes.  Prednisone burst, doxycycline, and albuterol are sent in for the COPD exacerbation.  I do hope that the prednisone will also help how her knee feels.  I have asked her to follow-up with primary care and with orthopedics  concerning her knee.  I am recommending Tylenol over-the-counter for the knee pain.  Also since she has been ill for a couple of weeks and may not be eating sufficient I am going to draw a CBC and a BMP.  Will notify her of any significant abnormalities  When I was about ready to discharge patient, it was discovered that she become more short of breath and was wheezing more intensely.  Albuterol nebulization was given then with some relief.  She was also given an injection of triamcinolone to get her steroid course started. Final Clinical Impressions(s) / UC Diagnoses   Final diagnoses:  COPD exacerbation (HCC)  Acute pain of right knee     Discharge Instructions      Your knee x-ray does not show any broken bones.  There are signs of arthritis on the x-ray  Your sugar was normal at 82  Albuterol inhaler--do 2 puffs every 4  hours as needed for shortness of breath or wheezing  Take prednisone 20 mg--2 daily for 5 days  Take doxycycline 100 mg --1 capsule 2 times daily for 7 days  It is my hope that the prednisone will also help how your knee feels as it is a good anti-inflammatory to use in the short-term.  You can also take Tylenol Extra Strength as needed for the pain.  We have drawn blood to check your blood counts and your sodium and potassium.  Staff will notify you if anything is abnormal     ED Prescriptions     Medication Sig Dispense Auth. Provider   albuterol (PROVENTIL) (2.5 MG/3ML) 0.083% nebulizer solution Take 3 mLs (2.5 mg total) by nebulization every 4 (four) hours as needed for wheezing or shortness of breath. 225 mL Zenia Resides, MD   predniSONE (DELTASONE) 20 MG tablet Take 2 tablets (40 mg total) by mouth daily with breakfast for 5 days. 10 tablet Zenia Resides, MD   doxycycline (VIBRAMYCIN) 100 MG capsule Take 1 capsule (100 mg total) by mouth 2 (two) times daily for 7 days. 14 capsule Marlinda Mike, Janace Aris, MD      PDMP not reviewed this  encounter.   Zenia Resides, MD 03/07/23 1004    Zenia Resides, MD 03/07/23 [phone]

## 2023-03-07 NOTE — Discharge Instructions (Signed)
Your knee x-ray does not show any broken bones.  There are signs of arthritis on the x-ray  Your sugar was normal at 82  Albuterol inhaler--do 2 puffs every 4 hours as needed for shortness of breath or wheezing  Take prednisone 20 mg--2 daily for 5 days  Take doxycycline 100 mg --1 capsule 2 times daily for 7 days  It is my hope that the prednisone will also help how your knee feels as it is a good anti-inflammatory to use in the short-term.  You can also take Tylenol Extra Strength as needed for the pain.  We have drawn blood to check your blood counts and your sodium and potassium.  Staff will notify you if anything is abnormal

## 2023-03-10 ENCOUNTER — Other Ambulatory Visit: Payer: Self-pay | Admitting: Student

## 2023-03-10 DIAGNOSIS — J441 Chronic obstructive pulmonary disease with (acute) exacerbation: Secondary | ICD-10-CM

## 2023-03-14 ENCOUNTER — Ambulatory Visit: Payer: Self-pay | Admitting: Licensed Clinical Social Worker

## 2023-03-14 NOTE — Patient Outreach (Signed)
  Care Coordination   Follow Up Visit Note   03/14/2023 Name: [name] MRN: [id number] DOB: 09/30/1951  [name] is a 72 y.o. year old female who sees [name], MD for primary care. I spoke with  [name] by phone today.  What matters to the patients health and wellness today? Patient has right knee pain. She spoke of  health management needs   Goals Addressed             This Visit's Progress    Patient has right knee pain. she spoke of health management needs       Interventions:  Spoke with client about client needs Discussed transport needs of client. Discussed transport help with insurance of client Discussed medication procurement of client Discussed mood of client. She said she gets a little anxious occasionally in managing health needs Discussed medical appointments of client Discussed relaxation techniques . She likes working in her craft shop to help her relax. Discussed sleeping issues of client Discussed breathing challenges. She said she uses inhaler as needed. Discussed client managing medical needs.  Provided counseling support for client Discussed client history of cancer treatment. She spoke of family medical history. She said she plans to talk with PCP about second opinion on her current medical needs Reminded client of program support Encouraged client to call LCSW as needed for SW support at [phone]           SDOH assessments and interventions completed:  Yes  SDOH Interventions Today    Flowsheet Row Most Recent Value  SDOH Interventions   Depression Interventions/Treatment  Counseling  Physical Activity Interventions Other (Comments)  [some walking challenges]  Stress Interventions Provide Counseling  [client has stress related to managing medical needs]        Care Coordination Interventions:  Yes, provided   Interventions Today    Flowsheet Row Most Recent Value  Chronic Disease   Chronic  disease during today's visit Other  [spoke with client about client needs]  General Interventions   General Interventions Discussed/Reviewed General Interventions Discussed, Community Resources  [discussed program support]  Exercise Interventions   Exercise Discussed/Reviewed Physical Activity  [had recent fall, has some swelling in right knee. has some pain in right knee]  Physical Activity Discussed/Reviewed Physical Activity Reviewed  Education Interventions   Education Provided Provided Education  Provided Verbal Education On Community Resources  Mental Health Interventions   Mental Health Discussed/Reviewed Anxiety, Coping Strategies  [discussed mood of client. gets anxious occasionally about health issues]  Nutrition Interventions   Nutrition Discussed/Reviewed Nutrition Discussed  Safety Interventions   Safety Discussed/Reviewed Fall Risk        Follow up plan: Follow up call scheduled for 05/03/23 at 3:00 PM     Encounter Outcome:  Pt. Visit Completed   Kelton Pillar.Nathanyl Andujo MSW, LCSW Licensed Visual merchandiser [facility] Care Management [phone]

## 2023-03-14 NOTE — Patient Instructions (Signed)
Visit Information  Thank you for taking time to visit with me today. Please don't hesitate to contact me if I can be of assistance to you.   Following are the goals we discussed today:   Goals Addressed             This Visit's Progress    Patient has right knee pain. she spoke of health management needs       Interventions:  Spoke with client about client needs Discussed transport needs of client. Discussed transport help with insurance of client Discussed medication procurement of client Discussed mood of client. She said she gets a little anxious occasionally in managing health needs Discussed medical appointments of client Discussed relaxation techniques . She likes working in her craft shop to help her relax. Discussed sleeping issues of client Discussed breathing challenges. She said she uses inhaler as needed. Discussed client managing medical needs.  Provided counseling support for client Discussed client history of cancer treatment. She spoke of family medical history. She said she plans to talk with PCP about second opinion on her current medical needs Reminded client of program support Encouraged client to call LCSW as needed for SW support at [phone]           Our next appointment is by telephone on 05/03/23 at 3:00 PM   Please call the care guide team at [phone] if you need to cancel or reschedule your appointment.   If you are experiencing a Mental Health or Behavioral Health Crisis or need someone to talk to, please go to [facility] [address], [place] [phone])   The patient verbalized understanding of instructions, educational materials, and care plan provided today and DECLINED offer to receive copy of patient instructions, educational materials, and care plan.   The patient has been provided with contact information for the care management team and has been advised to call with any health related  questions or concerns.   Kelton Pillar.Emina Ribaudo MSW, LCSW Licensed Visual merchandiser [facility] Care Management [phone]

## 2023-03-16 NOTE — Progress Notes (Signed)
Internal Medicine Clinic Attending  Case discussed with Dr. McLendon  at the time of the visit.  We reviewed the resident's history and pertinent patient test results.  I agree with the assessment, diagnosis, and plan of care documented in the resident's note.  

## 2023-03-28 ENCOUNTER — Encounter: Payer: Self-pay | Admitting: Student

## 2023-03-28 ENCOUNTER — Ambulatory Visit (INDEPENDENT_AMBULATORY_CARE_PROVIDER_SITE_OTHER): Payer: Medicare Other | Admitting: Student

## 2023-03-28 VITALS — BP 188/81 | HR 99 | Temp 98.3°F | Ht 65.0 in | Wt 146.2 lb

## 2023-03-28 DIAGNOSIS — J328 Other chronic sinusitis: Secondary | ICD-10-CM

## 2023-03-28 DIAGNOSIS — R413 Other amnesia: Secondary | ICD-10-CM | POA: Diagnosis not present

## 2023-03-28 DIAGNOSIS — I10 Essential (primary) hypertension: Secondary | ICD-10-CM | POA: Diagnosis not present

## 2023-03-28 DIAGNOSIS — F17210 Nicotine dependence, cigarettes, uncomplicated: Secondary | ICD-10-CM

## 2023-03-28 DIAGNOSIS — Z720 Tobacco use: Secondary | ICD-10-CM

## 2023-03-28 DIAGNOSIS — J441 Chronic obstructive pulmonary disease with (acute) exacerbation: Secondary | ICD-10-CM | POA: Diagnosis not present

## 2023-03-28 DIAGNOSIS — F419 Anxiety disorder, unspecified: Secondary | ICD-10-CM | POA: Diagnosis not present

## 2023-03-28 DIAGNOSIS — E118 Type 2 diabetes mellitus with unspecified complications: Secondary | ICD-10-CM

## 2023-03-28 LAB — POCT GLYCOSYLATED HEMOGLOBIN (HGB A1C): Hemoglobin A1C: 5 % (ref 4.0–5.6)

## 2023-03-28 LAB — GLUCOSE, CAPILLARY: Glucose-Capillary: 117 mg/dL — ABNORMAL HIGH (ref 70–99)

## 2023-03-28 MED ORDER — LOSARTAN POTASSIUM-HCTZ 50-12.5 MG PO TABS
1.0000 | ORAL_TABLET | Freq: Two times a day (BID) | ORAL | 3 refills | Status: DC
Start: 2023-03-28 — End: 2023-03-28

## 2023-03-28 NOTE — Assessment & Plan Note (Signed)
Hypertensive during this visit. Denies chest pain, SOB, changes in vision. Reports that she has not been taking medicines consistently. Presents with over 10 bottles of medications prescribed since 2019, some full, some empty. Last time she took medications was ~2 months ago. Denies forgetting but reports she is overwhelmed with the number of bottles as she is unsure what to take. Went through the pill bottles and discarded expired prescriptions with help of CMA Jada. Of note, patient never picked up Losartan-HCTZ prescribed on 12/2022.  Plan - Restart Amlodipine and Carvedilol - Plan to see patient in 2 weeks for BP check and medication escalation - Continue to encourage smoking cessation

## 2023-03-28 NOTE — Assessment & Plan Note (Signed)
Patient mentions she has cut to 1/2 pack daily. Discussed this is contributing to exacerbations of COPD, anxiety, and hypertension. Open to discuss pharmacotherapy in 2 weeks

## 2023-03-28 NOTE — Assessment & Plan Note (Signed)
Patient completed course of prednisone and duloxetine after recent ED visit. Denies cough, SOB, wheezing, changes in  sputum production. Has nebulized treatment and rescue therapy at home. Has not needed rescue therapy more than twice since the ED visit. Previously on Trelegy; currently working of medication assistance. No expiratory wheezing in clinic today. Counseled on smoking cessation. No current samples of trelegy in clinic today - Messaged Ms. Evertte about medication assistance - Continue nebulized treatments and PRN albuterol

## 2023-03-28 NOTE — Assessment & Plan Note (Signed)
Patient has noted improvement in symptoms. Using PRN Intranasal fluticasone and Loratadine. Counseled on nasal irrigation to help and switching to continuous use of all of the above in conjuction with smoking cessation

## 2023-03-28 NOTE — Progress Notes (Signed)
Subjective:  CC: ED follow up  HPI:  [name] is a 72 y.o. female with a past medical history stated below and presents today for ED follow up during which she was treated for COPD exacerbation on 4/29. Patient was treated with . Please see problem based assessment and plan for additional details.  Past Medical History:  Diagnosis Date   Acute sinusitis 01/09/2019   Anxiety    Arthritis    "fingers, knees" (08/16/2018)   Asthma    Atopic dermatitis 03/20/2018   Axillary hidradenitis suppurativa 10/13/2018   Cancer of right breast (HCC) 1991   s/p lumpectomy, chemotherapy and radiation therapy in 1991. Mammogram in 2007 was normal.   Constipated    h/o   COPD (chronic obstructive pulmonary disease) (HCC)    History of multiple hospital admissions for exercabation    COPD exacerbation (HCC) 01/01/2019   COPD with acute exacerbation (HCC) 01/14/2019   COPD with exacerbation (HCC) 04/06/2009   Qualifier: Diagnosis of  By: Comer Locket MD, Vijay     Depression    Diarrhea    h/o   GERD (gastroesophageal reflux disease)    Headache    "a few times/month" (08/16/2018   Heart murmur 10/05/11   "first time I ever heard I had one was today"   History of breast cancer 12/01/2012   Pt with h/o breast CA s/p lumpectomy with chemo/radiation in 1991. Pt mammogram 2011 was unremarkable. Had CT chest 10/2012 in ED for SOB and showed spiculated nodule with lymph node. 12/04/12: Birads 2; repeat diagnostic mammogram in 1 year.     Hyperlipidemia    Hypertension    Lower extremity edema 09/21/2018   Obesity    Personal history of chemotherapy    Personal history of radiation therapy    Pneumonia    "couple times in the last 10-15 yrs" (08/16/2018)   QT prolongation 08/08/2014   Seasonal allergies 02/25/2017   Shortness of breath 10/05/11   "at rest; lying down; w/exertion"   Sigmoid diverticulitis 80/2008   Tobacco abuse    Type 2 diabetes mellitus (HCC) 05/14/2009   Type II diabetes  mellitus (HCC)     Current Outpatient Medications on File Prior to Visit  Medication Sig Dispense Refill   acetaminophen (TYLENOL) 325 MG tablet Take 2 tablets (650 mg total) by mouth every 6 (six) hours as needed for mild pain (or Fever >/= 101).     albuterol (PROVENTIL) (2.5 MG/3ML) 0.083% nebulizer solution Take 3 mLs (2.5 mg total) by nebulization every 4 (four) hours as needed for wheezing or shortness of breath. 225 mL 0   albuterol (VENTOLIN HFA) 108 (90 Base) MCG/ACT inhaler INHALE 2 PUFFS INTO THE LUNGS EVERY 6 HOURS AS NEEDED FOR WHEEZING OR SHORTNESS OF BREATH 6.7 g 3   amLODipine (NORVASC) 10 MG tablet Take 1 tablet (10 mg total) by mouth daily. 90 tablet 3   azelastine (ASTELIN) 0.1 % nasal spray Place 2 sprays into both nostrils 2 (two) times daily. Use in each nostril as directed 60 mL 0   Blood Glucose Monitoring Suppl (ONETOUCH VERIO) w/Device KIT Use to check blood sugar once daily. (E11.8) (Patient not taking: Reported on 01/14/2023) 1 kit 0   DULoxetine (CYMBALTA) 60 MG capsule Take 1 capsule (60 mg total) by mouth daily. 90 capsule 3   fexofenadine (ALLEGRA ALLERGY) 60 MG tablet Take 1 tablet (60 mg total) by mouth 2 (two) times daily. 30 tablet 0   fluticasone (FLONASE)  50 MCG/ACT nasal spray Place 1 spray into both nostrils daily. 11.1 mL 3   loratadine (CLARITIN) 10 MG tablet Take 1 tablet (10 mg total) by mouth daily. 30 tablet 11   metoprolol tartrate (LOPRESSOR) 25 MG tablet Take 1 tablet (25 mg total) by mouth 2 (two) times daily. 180 tablet 3   simvastatin (ZOCOR) 40 MG tablet Take 1 tablet (40 mg total) by mouth every evening. 30 tablet 0   Tiotropium Bromide Monohydrate (SPIRIVA RESPIMAT) 2.5 MCG/ACT AERS Inhale 2 puffs into the lungs daily. (Patient not taking: Reported on 01/19/2023) 1 each 0   [DISCONTINUED] fluticasone-salmeterol (ADVAIR DISKUS) 250-50 MCG/ACT AEPB Inhale 1 puff into the lungs in the morning and at bedtime. 60 each 1   No current  facility-administered medications on file prior to visit.    Family History  Problem Relation Age of Onset   Cancer Mother        unknown   Breast cancer Daughter 7   Breast cancer Daughter        60s   BRCA 1/2 Neg Hx     Social History   Socioeconomic History   Marital status: Married    Spouse name: Not on file   Number of children: Not on file   Years of education: 12   Highest education level: Not on file  Occupational History    Employer: UNEMPLOYED  Tobacco Use   Smoking status: Every Day    Packs/day: 0.50    Years: 45.00    Additional pack years: 0.00    Total pack years: 22.50    Types: Cigarettes   Smokeless tobacco: Never   Tobacco comments:    1/2 pk per day  Vaping Use   Vaping Use: Never used  Substance and Sexual Activity   Alcohol use: Yes    Alcohol/week: 4.0 standard drinks of alcohol    Types: 4 Cans of beer per week    Comment: 08/16/2018 "weekends only"   Drug use: No   Sexual activity: Yes  Other Topics Concern   Not on file  Social History Narrative   Lived in [place] with her husband.   Takes care of 3 grand children.   Trying to find a job, has financial difficulties.      Social Determinants of Health   Financial Resource Strain: Low Risk  (12/13/2022)   Overall Financial Resource Strain (CARDIA)    Difficulty of Paying Living Expenses: Not hard at all  Food Insecurity: No Food Insecurity (12/13/2022)   Hunger Vital Sign    Worried About Running Out of Food in the Last Year: Never true    Ran Out of Food in the Last Year: Never true  Transportation Needs: No Transportation Needs (12/13/2022)   PRAPARE - Administrator, Civil Service (Medical): No    Lack of Transportation (Non-Medical): No  Physical Activity: Inactive (03/14/2023)   Exercise Vital Sign    Days of Exercise per Week: 1 day    Minutes of Exercise per Session: 0 min  Stress: Stress Concern Present (03/14/2023)   [employer] of Occupational Health -  Occupational Stress Questionnaire    Feeling of Stress : Rather much  Social Connections: Unknown (12/13/2022)   Social Connection and Isolation Panel [NHANES]    Frequency of Communication with Friends and Family: Patient declined    Frequency of Social Gatherings with Friends and Family: Patient declined    Attends Religious Services: Patient declined    Active Member  of Clubs or Organizations: Patient declined    Attends Banker Meetings: Patient declined    Marital Status: Married  Catering manager Violence: Not At Risk (12/13/2022)   Humiliation, Afraid, Rape, and Kick questionnaire    Fear of Current or Ex-Partner: No    Emotionally Abused: No    Physically Abused: No    Sexually Abused: No    Review of Systems: ROS negative except for what is noted on the assessment and plan.  Objective:   Vitals:   03/28/23 0917 03/28/23 0930  BP: (!) 160/88 (!) 174/122  Pulse: (!) 110 97  Temp: 98.3 F (36.8 C)   TempSrc: Oral   SpO2: 100%   Weight: 146 lb 3.2 oz (66.3 kg)   Height: 5\' 5"  (1.651 m)     Physical Exam: Constitutional: well-appearing woman sitting in chair, in no acute distress HENT: normocephalic atraumatic, mucous membranes moist Eyes: conjunctiva non-erythematous Neck: supple Cardiovascular: regular rate and rhythm, no m/r/g Pulmonary/Chest: normal work of breathing on room air, lungs clear to auscultation bilaterally Abdominal: soft, non-tender, non-distended MSK: normal bulk and tone Neurological: alert & oriented x 3, 5/5 strength in bilateral upper and lower extremities, normal gait Skin: warm and dry Psych: Anxious mood and affect         12/13/2022   11:34 AM 03/04/2021    9:25 AM 12/11/2020    2:23 PM 03/14/2019    8:43 AM  GAD 7 : Generalized Anxiety Score  Nervous, Anxious, on Edge 1 1 2 1   Control/stop worrying 2 1 2 1   Worry too much - different things 1 2 2 1   Trouble relaxing 2 1 2  0  Restless 1 1 2  0  Easily annoyed or irritable 2  2 2 1   Afraid - awful might happen 2 0 1 1  Total GAD 7 Score 11 8 13 5   Anxiety Difficulty Somewhat difficult Not difficult at all Somewhat difficult      Assessment & Plan:   COPD exacerbation (HCC) Patient completed course of prednisone and duloxetine after recent ED visit. Denies cough, SOB, wheezing, changes in  sputum production. Has nebulized treatment and rescue therapy at home. Has not needed rescue therapy more than twice since the ED visit. Previously on Trelegy; currently working of medication assistance. No expiratory wheezing in clinic today. Counseled on smoking cessation. No current samples of trelegy in clinic today - Messaged Ms. Evertte about medication assistance - Continue nebulized treatments and PRN albuterol  Chronic sinusitis Patient has noted improvement in symptoms. Using PRN Intranasal fluticasone and Loratadine. Counseled on nasal irrigation to help and switching to continuous use of all of the above in conjuction with smoking cessation  Hypertension, benign essential, goal below 140/90 Hypertensive during this visit. Denies chest pain, SOB, changes in vision. Reports that she has not been taking medicines consistently. Presents with over 10 bottles of medications prescribed since 2019, some full, some empty. Last time she took medications was ~2 months ago. Denies forgetting but reports she is overwhelmed with the number of bottles as she is unsure what to take. Went through the pill bottles and discarded expired prescriptions with help of CMA Jada. Of note, patient never picked up Losartan-HCTZ prescribed on 12/2022.  Plan - Restart Amlodipine and Carvedilol - Plan to see patient in 2 weeks for BP check and medication escalation - Continue to encourage smoking cessation  Type 2 diabetes mellitus with complication, without long-term current use of insulin (HCC) Previously on Metformin,  but patient selt discontinued. Has been off medications ~ 1 year according to  patient. Last A1c 5.2 3 months ago. Denies catabolic symptoms today. No changes in urinary frequency. A1c today 5.0 -Continue monitor symptoms and A1c every 3 months   Anxiety Unchanged problem. Patient has not been taking medications and continues to express stress over breast mass despite reassuring MRI breast on 12/2022 which showed benign dystrophic calcification at the lumpectomy site in the upper RIGHT breast, corresponding to the palpable area of concern. Discussed that the recommendation is for a screening mammogram every year. Patient has been taking duloxetine intermittently. Discussed importance of adherence and complementary therapy with behavioral services - Duloxetine 60 mg daily - Referral to integrated behavioral health  Tobacco abuse Patient mentions she has cut to 1/2 pack daily. Discussed this is contributing to exacerbations of COPD, anxiety, and hypertension. Open to discuss pharmacotherapy in 2 weeks  Memory changes Patient does not believe she is more forgetful than the average person, but husband would like her to get tested. She declined testing today as she was nervous and is needed elsehwere today. In agreement with undergoing MoCA testing at 2 week follow up    Return in about 2 weeks (around 04/11/2023) for HTN and memory concerns.  Patient discussed with Dr. Lupita Leash, MD [facility] - PGY-1 03/28/2023, 10:45 AM

## 2023-03-28 NOTE — Assessment & Plan Note (Signed)
Unchanged problem. Patient has not been taking medications and continues to express stress over breast mass despite reassuring MRI breast on 12/2022 which showed benign dystrophic calcification at the lumpectomy site in the upper RIGHT breast, corresponding to the palpable area of concern. Discussed that the recommendation is for a screening mammogram every year. Patient has been taking duloxetine intermittently. Discussed importance of adherence and complementary therapy with behavioral services - Duloxetine 60 mg daily - Referral to integrated behavioral health

## 2023-03-28 NOTE — Patient Instructions (Addendum)
Thank you, Ms.[name] for allowing [place] to provide your care today. Today we discussed your recent hospital visit, your diabetes, and your HTN.    Please start taking again:  You already have: Simvastatin - 1 tablet every day Amlodipine - 1 tablet once a day Metoprolol- 1 tablet every 12 hours Duloxetine - one tablet at night  for anxiety   Continue your inhalers at home and taking the allergy medications. I have sent a message to the Trelegy representative at our clinic to follow with assistance. Unfortunately, we do niot have any more sample in clinic  Referral - I have placed a referral to be seen with our behavioral counselor about your anxiety  I have ordered the following labs for you:   Lab Orders         Glucose, capillary         POC Hbg A1C      I will call if any are abnormal. All of your labs can be accessed through "My Chart".   My Chart Access: [email or web address]  Please follow-up in: 2 weeks - we will talk about your blood pressure and your memory concerns    We look forward to seeing you next time. Please call our clinic at [phone] if you have any questions or concerns. The best time to call is Monday-Friday from 9am-4pm, but there is someone available 24/7. If after hours or the weekend, call the main hospital number and ask for the Internal Medicine Resident On-Call. If you need medication refills, please notify your pharmacy one week in advance and they will send [place] a request.   Thank you for letting us take part in your care. Wishing you the best!  [name], MD 03/28/2023, 9:28 AM [facility] Resident, PGY-1   Grounding Techniques  [phone] Grounding Technique: This grounding technique can be utilized when you're feeling stressed or anxious. It can also be useful when you are having trouble falling asleep when your mind is racing.  List 5 things you can see. Describe any  details that stand out.    Ex: I can see my dog sleeping on the rug beside the couch. List 4 things you can touch. Describe how they feel.     Ex: The shirt I'm wearing is very soft. It feels silky to the touch. List 3 things you can hear. Think about how loud or quiet they may be.    Ex: I can hear a car going down the street. It sounds distant. List 2 things you can smell. Does the smell make you think of anything?    Ex: I can smell the lotion on my hands. It reminds me of when I bought it with my sister. List 1 thing you can taste. You can always place a piece of candy in your mouth or take a bite of something. What kind of flavors do you taste?     Ex: I can taste the toothpaste I used to brush my teeth.    CALM Grounding Technique: This grounding technique can be utilized when you are feeling stressed or tense. Prior to starting the grounding exercise, sit comfortably with both feet on the ground. Uncross your arms and hands. Begin by taking 3 deep breaths. Inhale through your noise for 5 seconds, hold it, and exhale through your mouth for 5 seconds.  C- chest. Focus on your chest, your belly, and your back. How are you breathing? Where are you holding tension? Relax those  areas. A- arms. Focus on your arms from your shoulders to your fingertips. Where are you holding tension? Relax those areas. Ball up your fists and then let it go.  L- legs. Focus on your legs from your hips down to your toes. Where are you holding tension? Relax those areas. Ball up your toes and then let it go. M- mouth. Focus on your head, your jaw, your mouth, your neck. Where are you holding tension? Relax and let it go. End by taking 3 deep breaths. Inhale through your noise for 5 seconds, hold it, and exhale through your mouth for 5 seconds.   Using Your Feet Grounding Technique: This technique can be utilized when you are feeling anxious. Imagine your big toe as a paintbrush. Take one foot and trace the outline  of your other foot. Start at the heel and using your imaginary paint brush go all the way around your foot. Slowly go up the side of your foot. Trace each toe. Return to the heel. Repeat on the other foot. Tense one foot. Relax the same foot. Notice the tension release. Repeat with the other foot. Wiggle your toes. Notice how they move. Do any toes move independently of the others or are they all connected?   Colors Grounding Technique: This grounding technique can be utilized when you are feeling anxious or stressed. Choose a color. Look around your room and point out anything that is that color. When you've identified all the items, select another color and repeat.  Counting Grounding Technique: This grounding technique can be useful when you are feeling anxious or when you are having trouble falling asleep. Start at 100 and count backwards by 7. This isn't easy and forces your mind to focus on counting rather than letting your mind race.  PB&J Grounding Technique: This grounding technique can be useful when you are feeling anxious or stressed. Go through step by step how to make a peanut butter and jelly sandwich. Be very specific.  Ex: First I need to go to the kitchen. I will need to open the cupboard so that I can get out the bread, the peanut butter and jelly. Then I will shut the cupboard. Next I need a knife. I will open the drawer and pull out a knife. I have to open the bread. I will take out two slices. Not an end piece. I don't like the end pieces. Then I will close the bag.. You can do this with any ordinary task. Be as specific as possible. Try not to leave out any steps. Some examples of tasks you can describe are: Making your bed in the morning, Driving to work/school/a family member's house. (Pick a route you're very familiar with), Getting a cup of water, Your bedtime routine, Getting dressed  Categories Grounding Technique: This grounding technique can be useful when you are  feeling anxious or stressed. Choose a broad category and go through naming as many items within that category as you can. When you run out of items, pick a new category and repeat the process. Examples of categories include: Movies, Countries, Books, Sports teams, Animals, Fruits, Vegetables, Names that start with "S"

## 2023-03-28 NOTE — Assessment & Plan Note (Signed)
Patient does not believe she is more forgetful than the average person, but husband would like her to get tested. She declined testing today as she was nervous and is needed elsehwere today. In agreement with undergoing MoCA testing at 2 week follow up

## 2023-03-28 NOTE — Telephone Encounter (Signed)
Per Dr. Daiva Eves, pt never rec'd application. Will mail to home again.

## 2023-03-28 NOTE — Assessment & Plan Note (Signed)
Previously on Metformin, but patient selt discontinued. Has been off medications ~ 1 year according to patient. Last A1c 5.2 3 months ago. Denies catabolic symptoms today. No changes in urinary frequency. A1c today 5.0 -Continue monitor symptoms and A1c every 3 months

## 2023-03-29 NOTE — Progress Notes (Signed)
Internal Medicine Clinic Attending  Case discussed with Dr. Daiva Eves  At the time of the visit.  We reviewed the resident's history and exam and pertinent patient test results.  I agree with the assessment, diagnosis, and plan of care documented in the resident's note.  There indeed has been concern about understanding of her conditions and treatment and exploring literacy as well as cognition will be helpful.  Once her stable regimen is identified, would she benefit from prepacked pill packs? Med delivery?

## 2023-04-11 ENCOUNTER — Encounter: Payer: Medicare Other | Admitting: Student

## 2023-05-03 ENCOUNTER — Ambulatory Visit (INDEPENDENT_AMBULATORY_CARE_PROVIDER_SITE_OTHER): Payer: Medicare Other | Admitting: Licensed Clinical Social Worker

## 2023-05-03 ENCOUNTER — Ambulatory Visit: Payer: Self-pay | Admitting: Licensed Clinical Social Worker

## 2023-05-03 DIAGNOSIS — F419 Anxiety disorder, unspecified: Secondary | ICD-10-CM

## 2023-05-03 NOTE — Patient Instructions (Signed)
Visit Information  Thank you for taking time to visit with me today. Please don't hesitate to contact me if I can be of assistance to you.   Following are the goals we discussed today:   Goals Addressed               This Visit's Progress     patient spoke of  medical needs  and issues in dealing with anxiety (pt-stated)        Interventions:  LCSW talked via phone with [name] today about her needs. She spoke of medical needs. She said she had seen an oncologist previously for exam. [name] said she wants a second  opinion of her health needs. She would like to see another oncologist.  . LCSW talked with client about program support for client with RN, Pharmacist and LCSW Provided counseling support for client.   Reviewed mood issues. Client said she feels anxious occasionally in managing medical needs. She had a history of cancer years ago and wants to be proactive in managing her current health needs. She likes to do crafts to help her relax. She enjoys cooking as well Reviewed medication procurement of client Reviewed walking challenges of client Reviewed support for client. She said she has support from her spouse and from her daughter. Reviewed pain issues. She spoke of foot pain issues.  Discussed breathing challenges of client. She spoke of using nebulizer as prescribed for breathing treatment. She spoke of her use of inhaler as needed Discussed PCP support for client Discussed transport needs. She said that her spouse, [name] helps her with transport needs. She also said that her daughter sometimes helps her with transport needs She spoke of the fact that her spouse is still working at present. Encouraged [name] to call LCSW as needed for SW support at [phone].          Our next appointment is by telephone on 07/05/23 at 2:30 PM   Please call the care guide team at [phone] if you need to cancel or reschedule your appointment.   If you are  experiencing a Mental Health or Behavioral Health Crisis or need someone to talk to, please go to [facility] [address] [place] [phone])   The patient verbalized understanding of instructions, educational materials, and care plan provided today and DECLINED offer to receive copy of patient instructions, educational materials, and care plan.   The patient has been provided with contact information for the care management team and has been advised to call with any health related questions or concerns.   Kelton Pillar.Shravya Wickwire MSW, LCSW Licensed Visual merchandiser [facility] Care Management [phone]

## 2023-05-03 NOTE — BH Specialist Note (Signed)
Patient no-showed today's appointment; appointment was for Telephone visit at 9:00am  Behavioral Health Clinician [facility] from here on out)  attempted patient via Telephone number [phone]   [facility] contacted patient from telephone number [phone]. [facility] left a  VM.   Patient will need to reschedule appointment by calling Internal medicine center [phone].  Christen Butter, MSW, LCSW-A She/Her Behavioral Health Clinician [facility]  Internal Medicine Center Direct Dial:[phone]  Fax [phone] Main Office Phone: [phone] [address] [place] [place], [place] [address] Website: [facility] Internal Medicine [facility]  [place], [place]  [place]

## 2023-05-03 NOTE — Patient Outreach (Signed)
Care Coordination   Follow Up Visit Note   05/03/2023 Name: [name] MRN: [id number] DOB: May 23, 1951  [name] is a 72 y.o. year old female who sees [name], MD for primary care. I spoke with  [name] by phone today.  What matters to the patients health and wellness today? Patient spoke of medical needs and issues in dealing with  anxiety     Goals Addressed               This Visit's Progress     patient spoke of  medical needs  and issues in dealing with anxiety (pt-stated)        Interventions:  LCSW talked via phone with [name] today about her needs. She spoke of medical needs. She said she had seen an oncologist previously for exam. [name] said she wants a second  opinion of her health needs. She would like to see another oncologist.  . LCSW talked with client about program support for client with RN, Pharmacist and LCSW Provided counseling support for client.   Reviewed mood issues. Client said she feels anxious occasionally in managing medical needs. She had a history of cancer years ago and wants to be proactive in managing her current health needs. She likes to do crafts to help her relax. She enjoys cooking as well Reviewed medication procurement of client Reviewed walking challenges of client Reviewed support for client. She said she has support from her spouse and from her daughter. Reviewed pain issues. She spoke of foot pain issues.  Discussed breathing challenges of client. She spoke of using nebulizer as prescribed for breathing treatment. She spoke of her use of inhaler as needed Discussed PCP support for client Discussed transport needs. She said that her spouse, [name] helps her with transport needs. She also said that her daughter sometimes helps her with transport needs She spoke of the fact that her spouse is still working at present. Encouraged [name] to call LCSW as needed for SW support at  [phone].          SDOH assessments and interventions completed:  Yes  SDOH Interventions Today    Flowsheet Row Most Recent Value  SDOH Interventions   Depression Interventions/Treatment  Counseling  Physical Activity Interventions Other (Comments)  [has some walking challenges]  Stress Interventions Provide Counseling  [client has stress in managing medical needs. She is concerned about medical condtions]        Care Coordination Interventions:  Yes, provided   Interventions Today    Flowsheet Row Most Recent Value  Chronic Disease   Chronic disease during today's visit Other  [spoke with client about client needs]  General Interventions   General Interventions Discussed/Reviewed General Interventions Discussed, Community Resources  [reviewed program support for client]  Exercise Interventions   Exercise Discussed/Reviewed Physical Activity  [client has some walking challenges. she has some pain issues]  Physical Activity Discussed/Reviewed Physical Activity Discussed  Education Interventions   Education Provided Provided Education  Provided Verbal Education On [employer] is interested in getting an exam by another oncologist. She had one appointment with oncologist,  but, client desires to have a second opinion of her condtion from another oncologist.  .]  Mental Health Interventions   Mental Health Discussed/Reviewed Anxiety, Coping Strategies  [client gets anxious occasionally in managing her medical needs. She likes doing craft projects to help her relax. she also likes to cook to help her relax]  Nutrition Interventions  Nutrition Discussed/Reviewed Nutrition Discussed  Pharmacy Interventions   Pharmacy Dicussed/Reviewed Pharmacy Topics Discussed        Follow up plan: Follow up call scheduled for 07/05/23 at 2:30 PM     Encounter Outcome:  Pt. Visit Completed   Kelton Pillar.Kwana Ringel MSW, LCSW Licensed Visual merchandiser [facility] Care  Management [phone]

## 2023-07-05 ENCOUNTER — Ambulatory Visit: Payer: Self-pay | Admitting: Licensed Clinical Social Worker

## 2023-07-05 NOTE — Patient Outreach (Signed)
  Care Coordination   07/05/2023 Name: [name] MRN: [id number] DOB: 1951/10/30   Care Coordination Outreach Attempts:  An unsuccessful telephone outreach was attempted today to offer the patient information about available care coordination services.  Follow Up Plan:  Additional outreach attempts will be made to offer the patient care coordination information and services.   Encounter Outcome:  No Answer   Care Coordination Interventions:  No, not indicated    Kelton Pillar.Michiel Sivley MSW, LCSW Licensed Visual merchandiser [facility] Care Management [phone]

## 2023-07-14 ENCOUNTER — Telehealth: Payer: Self-pay

## 2023-07-14 ENCOUNTER — Other Ambulatory Visit: Payer: Self-pay | Admitting: Student

## 2023-07-14 DIAGNOSIS — J019 Acute sinusitis, unspecified: Secondary | ICD-10-CM

## 2023-07-14 MED ORDER — FLUTICASONE PROPIONATE 50 MCG/ACT NA SUSP
1.0000 | Freq: Every day | NASAL | 3 refills | Status: DC
Start: 2023-07-14 — End: 2024-02-22

## 2023-07-14 NOTE — Telephone Encounter (Signed)
Patient called she stated she tested positive for covid, she is requesting a rx for prednisone.

## 2023-07-14 NOTE — Progress Notes (Signed)
Patient called on complaining of congestion, and asking for steroids.  She does have a history of COPD and has been using her nebulizer more frequently.  When asked if she is short of breath, she says it is likely due to her sinuses being congested.  She does not seem to be hypoxic, and is speaking in full sentences on the phone.  She said she has some mild wheezes, but do not believe that she is currently in a COPD exacerbation.  Again, difficult to tell without full evaluation as this was done over the phone.  Will treat conservatively with decongestant medication like Flonase, also recommended Nettie pot usage.

## 2023-07-14 NOTE — Telephone Encounter (Signed)
RTC to patient thinks she tested posiive for Covid on Saturday.  Had been feeling bad since last Thursday.  No fvers so far.  Wants to get the Prednisone because she has been using her inhaler a lot and wants to prevent coming to the hospital  Said that the Prednisone helps her to feel better . She is not feeling good today.  No coughing or fevers at present.

## 2023-07-25 ENCOUNTER — Ambulatory Visit: Payer: Self-pay | Admitting: Licensed Clinical Social Worker

## 2023-07-25 NOTE — Patient Outreach (Signed)
Care Coordination   Follow Up Visit Note   07/25/2023 Name: [name] MRN: [id number] DOB: 09-08-1951  [name] is a 72 y.o. year old female who sees [name], MD for primary care. I spoke with  [name] by phone today.  What matters to the patients health and wellness today?Has occasional right knee pain. She has anxiety is managing her health needs faced    Goals Addressed             This Visit's Progress    Patient has right knee pain. she spoke of health management needs       Interventions:  Spoke with client  via phone about managing health needs She is trying to manage her health needs and trying to manage needs of her spouse Discussed mood of client. She said she gets a little anxious occasionally in managing health needs. She is anxious in managing some of her health needs . She gets anxious occasionally in managing health needs of her spouse Client has said she uses nebulizer and inhaler as needed. She is keeping her medical appointments. She sees PCP as scheduled.  Have talked previously with client about relaxation techniques and have encouraged her to use techniques of choice to manage stress issues faced. Encouraged client to call LCSW as needed for SW support at [phone].          SDOH assessments and interventions completed:  Yes  SDOH Interventions Today    Flowsheet Row Most Recent Value  SDOH Interventions   Depression Interventions/Treatment  Counseling  Physical Activity Interventions Other (Comments)  [some mobility challenges]  Stress Interventions Provide Counseling  [stress in managing medical needs]        Care Coordination Interventions:  Yes, provided   Interventions Today    Flowsheet Row Most Recent Value  Chronic Disease   Chronic disease during today's visit Other  [spoke with client about client needs]  General Interventions   General Interventions Discussed/Reviewed General  Interventions Discussed, Community Resources  [discussed program support]  Education Interventions   Education Provided Provided Education  Provided Engineer, [employer] On [employer]  Mental Health Interventions   Mental Health Discussed/Reviewed Coping Strategies  [client is trying to manage coping skills. Trying to manage stress issues]  Pharmacy Interventions   Pharmacy Dicussed/Reviewed Pharmacy Topics Discussed       Follow up plan: Follow up call scheduled for 09/13/23 at 2:30 PM     Encounter Outcome:  Patient Visit Completed   Kelton Pillar.Aneisha Skyles MSW, LCSW Licensed Visual merchandiser [facility] Care Management [phone]

## 2023-07-25 NOTE — Patient Instructions (Signed)
Visit Information  Thank you for taking time to visit with me today. Please don't hesitate to contact me if I can be of assistance to you.   Following are the goals we discussed today:   Goals Addressed             This Visit's Progress    Patient has right knee pain. she spoke of health management needs       Interventions:  Spoke with client  via phone about managing health needs She is trying to manage her health needs and trying to manage needs of her spouse Discussed mood of client. She said she gets a little anxious occasionally in managing health needs. She is anxious in managing some of her health needs . She gets anxious occasionally in managing health needs of her spouse Client has said she uses nebulizer and inhaler as needed. She is keeping her medical appointments. She sees PCP as scheduled.  Have talked previously with client about relaxation techniques and have encouraged her to use techniques of choice to manage stress issues faced. Encouraged client to call LCSW as needed for SW support at [phone].          Our next appointment is by telephone on 09/13/23 at 2:30 PM   Please call the care guide team at [phone] if you need to cancel or reschedule your appointment.   If you are experiencing a Mental Health or Behavioral Health Crisis or need someone to talk to, please go to [facility] [address] [place] [phone])   The patient verbalized understanding of instructions, educational materials, and care plan provided today and DECLINED offer to receive copy of patient instructions, educational materials, and care plan.   The patient has been provided with contact information for the care management team and has been advised to call with any health related questions or concerns.   Kelton Pillar.Makina Skow MSW, LCSW Licensed Visual merchandiser [facility] Care Management [phone]

## 2023-09-13 ENCOUNTER — Ambulatory Visit: Payer: Self-pay | Admitting: Licensed Clinical Social Worker

## 2023-09-13 NOTE — Patient Instructions (Signed)
Visit Information  Thank you for taking time to visit with me today. Please don't hesitate to contact me if I can be of assistance to you.   Following are the goals we discussed today:   Goals Addressed             This Visit's Progress    Patient has right knee pain. she spoke of health management needs       Interventions:  Spoke with [name], spouse of client, about client needs Discussed medication procurement of client Discussed upcoming medical appointments of client Discussed sleeping issues of client. Discussed breathing issues of client. [name] said that client is sometimes short of breath Discussed walking of client. Sometimes client is fatigued when walking. Client may get short of breath when walking Client has said previously that she uses nebulizer and inhaler as needed. She is keeping her medical appointments. She sees PCP as scheduled.  Regarding relaxation techniques of client, she likes to cook to help her relax. She also likes working with crafts to help her relax Discussed client getting Handicapped Placard to use in her car for parking assistance. Encouraged [name] and client to call PCP office and request MD to consider completing request for Handicapped Placard for client use. Encouraged client  or Xavier Fournier to call LCSW as needed for SW support for client at [phone].          Our next appointment is by telephone on 10/31/23 at 10:00 AM   Please call the care guide team at [phone] if you need to cancel or reschedule your appointment.   If you are experiencing a Mental Health or Behavioral Health Crisis or need someone to talk to, please go to [facility] [address], [place] [phone])   The patient / Bekki Tavenner, spouse, verbalized understanding of instructions, educational materials, and care plan provided today and DECLINED offer to receive copy of patient instructions,  educational materials, and care plan.   The patient / Barney Gertsch, spouse, has been provided with contact information for the care management team and has been advised to call with any health related questions or concerns.   Kelton Pillar.Yaneliz Radebaugh MSW, LCSW Licensed Visual merchandiser [facility] Care Management [phone]

## 2023-09-13 NOTE — Patient Outreach (Signed)
  Care Coordination   Follow Up Visit Note   09/13/2023 Name: [name] MRN: [id number] DOB: 1951/03/13  [name] is a 72 y.o. year old female who sees [name], MD for primary care. I spoke with  [name] / [name], spouse of client, via phone today.  What matters to the patients health and wellness today?   Patient has right knee pain. She has some difficulty in managing her health needs   Goals Addressed             This Visit's Progress    Patient has right knee pain. she spoke of health management needs       Interventions:  Spoke with [name], spouse of client, about client needs Discussed medication procurement of client Discussed upcoming medical appointments of client Discussed sleeping issues of client. Discussed breathing issues of client. [name] said that client is sometimes short of breath Discussed walking of client. Sometimes client is fatigued when walking. Client may get short of breath when walking Client has said previously that she uses nebulizer and inhaler as needed. She is keeping her medical appointments. She sees PCP as scheduled.  Regarding relaxation techniques of client, she likes to cook to help her relax. She also likes working with crafts to help her relax Discussed client getting Handicapped Placard to use in her car for parking assistance. Encouraged [name] and client to call PCP office and request MD to consider completing request for Handicapped Placard for client use. Encouraged client  or [name] to call LCSW as needed for SW support for client at [phone].          SDOH assessments and interventions completed:  Yes  SDOH Interventions Today    Flowsheet Row Most Recent Value  SDOH Interventions   Depression Interventions/Treatment  Counseling  Physical Activity Interventions Other (Comments)  [client gets short of breath on occasion .Marland Kitchen Client may fatigue sometimes  when walking.]  Stress Interventions Other (Comment)  [client has some stress occasionally in managing medical needs]        Care Coordination Interventions:  Yes, provided   Interventions Today    Flowsheet Row Most Recent Value  Chronic Disease   Chronic disease during today's visit Other  [spoke with [name], spouse, about client needs]  General Interventions   General Interventions Discussed/Reviewed General Interventions Discussed, Community Resources  Exercise Interventions   Exercise Discussed/Reviewed Physical Activity  Education Interventions   Education Provided Provided Education  Provided Verbal Education On [employer]  Mental Health Interventions   Mental Health Discussed/Reviewed Coping Strategies  [client likes to cook to relax. client likes to do crafts occasionally]  Nutrition Interventions   Nutrition Discussed/Reviewed Nutrition Discussed  Pharmacy Interventions   Pharmacy Dicussed/Reviewed Pharmacy Topics Discussed  Safety Interventions   Safety Discussed/Reviewed Fall Risk       Follow up plan: Follow up call scheduled for 10/31/23 at 10:00 AM     Encounter Outcome:  Patient Visit Completed   Kelton Pillar.Adonys Wildes MSW, LCSW Licensed Visual merchandiser [facility] Care Management [phone]

## 2023-09-26 ENCOUNTER — Other Ambulatory Visit: Payer: Self-pay

## 2023-09-26 DIAGNOSIS — J441 Chronic obstructive pulmonary disease with (acute) exacerbation: Secondary | ICD-10-CM

## 2023-09-26 MED ORDER — ALBUTEROL SULFATE (2.5 MG/3ML) 0.083% IN NEBU: 2.5000 mg | INHALATION_SOLUTION | RESPIRATORY_TRACT | 0 refills | Status: DC | PRN

## 2023-09-26 MED ORDER — ALBUTEROL SULFATE HFA 108 (90 BASE) MCG/ACT IN AERS: 2.0000 | INHALATION_SPRAY | Freq: Four times a day (QID) | RESPIRATORY_TRACT | 3 refills | Status: DC | PRN

## 2023-10-19 ENCOUNTER — Encounter: Payer: Medicare Other | Admitting: Student

## 2023-10-19 ENCOUNTER — Encounter: Payer: Self-pay | Admitting: Student

## 2023-10-19 DIAGNOSIS — F22 Delusional disorders: Secondary | ICD-10-CM | POA: Insufficient documentation

## 2023-10-31 ENCOUNTER — Ambulatory Visit: Payer: Self-pay | Admitting: Licensed Clinical Social Worker

## 2023-10-31 NOTE — Patient Outreach (Signed)
  Care Coordination   Follow Up Visit Note   10/31/2023 Name: [name] MRN: [id number] DOB: [birth year]  [name] is a 72 y.o. year old female who sees Christie Mood, MD for primary care. I spoke with  [name] / [name], spouse of client, via  phone today.  What matters to the patients health and wellness today?  Patient has right knee pain. She has health management needs    Goals Addressed             This Visit's Progress    Patient has right knee pain. she spoke of health management needs       Interventions:  Spoke with [name], spouse of client, about client needs Discussed medication procurement of client Discussed upcoming medical appointments of client Discussed sleeping issues of client. [name] said that client sleeps some during the day Discussed walking of client. Sometimes client is fatigued when walking. Client may get short of breath when walking Regarding relaxation techniques of client, she likes to cook to help her relax. She also likes working with crafts to help her relax. She watches TV, listens to music, and likes to speak via phone with relatives and friends Discussed client transport needs. [name] said he transports client to and from client appointments Discussed vision of client. She wears glasses to help her with vision.  Discussed program support with RN, LCSW, Pharmacist Velta Addison for phone call with LCSW today Encouraged client  or [name] to call LCSW as needed for SW support for client at [phone].          SDOH assessments and interventions completed:  Yes  SDOH Interventions Today    Flowsheet Row Most Recent Value  SDOH Interventions   Depression Interventions/Treatment  Counseling  Physical Activity Interventions Other (Comments)  [decreased activity]  Stress Interventions Other (Comment)  [stress in managing medical needs]        Care Coordination Interventions:  Yes,  provided   Interventions Today    Flowsheet Row Most Recent Value  Chronic Disease   Chronic disease during today's visit Other  [spoke with [name], spouse of client, about client needs]  General Interventions   General Interventions Discussed/Reviewed General Interventions Discussed, Community Resources  Education Interventions   Education Provided Provided Education  Provided Verbal Education On [employer]  Mental Health Interventions   Mental Health Discussed/Reviewed Coping Strategies  [no [name] issues noted]  Nutrition Interventions   Nutrition Discussed/Reviewed Nutrition Discussed  [client may have decreased appetite]  Pharmacy Interventions   Pharmacy Dicussed/Reviewed Pharmacy Topics Discussed  Safety Interventions   Safety Discussed/Reviewed Fall Risk        Follow up plan: Follow up call scheduled for 01/03/24 at 3:30 PM     Encounter Outcome:  Patient Visit Completed   [name] MSW, LCSW Licensed Visual merchandiser [facility] Care Management [phone]

## 2023-10-31 NOTE — Patient Instructions (Signed)
Visit Information  Thank you for taking time to visit with me today. Please don't hesitate to contact me if I can be of assistance to you.   Following are the goals we discussed today:   Goals Addressed             This Visit's Progress    Patient has right knee pain. she spoke of health management needs       Interventions:  Spoke with [name], spouse of client, about client needs Discussed medication procurement of client Discussed upcoming medical appointments of client Discussed sleeping issues of client. [name] said that client sleeps some during the day Discussed walking of client. Sometimes client is fatigued when walking. Client may get short of breath when walking Regarding relaxation techniques of client, she likes to cook to help her relax. She also likes working with crafts to help her relax. She watches TV, listens to music, and likes to speak via phone with relatives and friends Discussed client transport needs. [name] said he transports client to and from client appointments Discussed vision of client. She wears glasses to help her with vision.  Discussed program support with RN, LCSW, Pharmacist Velta Addison for phone call with LCSW today Encouraged client  or [name] to call LCSW as needed for SW support for client at [phone].          Our next appointment is by telephone on 01/03/24 at 3:30 PM   Please call the care guide team at [phone] if you need to cancel or reschedule your appointment.   If you are experiencing a Mental Health or Behavioral Health Crisis or need someone to talk to, please go to [facility] [address], [place] [phone])   The patient / [name], spouse, verbalized understanding of instructions, educational materials, and care plan provided today and DECLINED offer to receive copy of patient instructions, educational materials, and care plan.   The patient /  [name], spouse, has been provided with contact information for the care management team and has been advised to call with any health related questions or concerns.   Kelton Pillar.Jacora Hopkins MSW, LCSW Licensed Visual merchandiser [facility] Care Management [phone]

## 2023-12-03 ENCOUNTER — Emergency Department (HOSPITAL_COMMUNITY): Payer: Medicare Other

## 2023-12-03 ENCOUNTER — Emergency Department (HOSPITAL_COMMUNITY)
Admission: EM | Admit: 2023-12-03 | Discharge: 2023-12-03 | Disposition: A | Discharge status: Home / Self Care | Payer: Medicare Other | Attending: Emergency Medicine | Admitting: Emergency Medicine

## 2023-12-03 ENCOUNTER — Other Ambulatory Visit: Payer: Self-pay

## 2023-12-03 DIAGNOSIS — J449 Chronic obstructive pulmonary disease, unspecified: Secondary | ICD-10-CM | POA: Insufficient documentation

## 2023-12-03 DIAGNOSIS — Z23 Encounter for immunization: Secondary | ICD-10-CM | POA: Diagnosis not present

## 2023-12-03 DIAGNOSIS — J45909 Unspecified asthma, uncomplicated: Secondary | ICD-10-CM | POA: Insufficient documentation

## 2023-12-03 DIAGNOSIS — Z79899 Other long term (current) drug therapy: Secondary | ICD-10-CM | POA: Insufficient documentation

## 2023-12-03 DIAGNOSIS — I10 Essential (primary) hypertension: Secondary | ICD-10-CM | POA: Diagnosis not present

## 2023-12-03 DIAGNOSIS — Z20822 Contact with and (suspected) exposure to covid-19: Secondary | ICD-10-CM | POA: Insufficient documentation

## 2023-12-03 DIAGNOSIS — J441 Chronic obstructive pulmonary disease with (acute) exacerbation: Secondary | ICD-10-CM | POA: Diagnosis not present

## 2023-12-03 DIAGNOSIS — R0602 Shortness of breath: Secondary | ICD-10-CM | POA: Diagnosis present

## 2023-12-03 LAB — CBC WITH DIFFERENTIAL/PLATELET
Abs Immature Granulocytes: 0.01 10*3/uL (ref 0.00–0.07)
Basophils Absolute: 0 10*3/uL (ref 0.0–0.1)
Basophils Relative: 1 %
Eosinophils Absolute: 0.4 10*3/uL (ref 0.0–0.5)
Eosinophils Relative: 7 %
HCT: 39 % (ref 36.0–46.0)
Hemoglobin: 12.9 g/dL (ref 12.0–15.0)
Immature Granulocytes: 0 %
Lymphocytes Relative: 30 %
Lymphs Abs: 1.7 10*3/uL (ref 0.7–4.0)
MCH: 29.9 pg (ref 26.0–34.0)
MCHC: 33.1 g/dL (ref 30.0–36.0)
MCV: 90.5 fL (ref 80.0–100.0)
Monocytes Absolute: 0.4 10*3/uL (ref 0.1–1.0)
Monocytes Relative: 8 %
Neutro Abs: 3 10*3/uL (ref 1.7–7.7)
Neutrophils Relative %: 54 %
Platelets: 311 10*3/uL (ref 150–400)
RBC: 4.31 MIL/uL (ref 3.87–5.11)
RDW: 14.5 % (ref 11.5–15.5)
WBC: 5.5 10*3/uL (ref 4.0–10.5)
nRBC: 0 % (ref 0.0–0.2)

## 2023-12-03 LAB — BASIC METABOLIC PANEL
Anion gap: 12 (ref 5–15)
BUN: 9 mg/dL (ref 8–23)
CO2: 25 mmol/L (ref 22–32)
Calcium: 8.7 mg/dL — ABNORMAL LOW (ref 8.9–10.3)
Chloride: 103 mmol/L (ref 98–111)
Creatinine, Ser: 0.7 mg/dL (ref 0.44–1.00)
GFR, Estimated: >60 mL/min (ref >60)
Glucose, Bld: 80 mg/dL (ref 70–99)
Potassium: 3.5 mmol/L (ref 3.5–5.1)
Sodium: 140 mmol/L (ref 135–145)

## 2023-12-03 LAB — RESP PANEL BY RT-PCR (RSV, FLU A&B, COVID)  RVPGX2
Influenza A by PCR: NEGATIVE
Influenza B by PCR: NEGATIVE
Resp Syncytial Virus by PCR: NEGATIVE
SARS Coronavirus 2 by RT PCR: NEGATIVE

## 2023-12-03 MED ORDER — ALBUTEROL SULFATE (2.5 MG/3ML) 0.083% IN NEBU: 2.5000 mg | INHALATION_SOLUTION | RESPIRATORY_TRACT | 0 refills | Status: DC | PRN

## 2023-12-03 MED ORDER — IPRATROPIUM-ALBUTEROL 0.5-2.5 (3) MG/3ML IN SOLN
3.0000 mL | RESPIRATORY_TRACT | Status: AC
  Administered 2023-12-03 (×2): 3 mL via RESPIRATORY_TRACT
  Filled 2023-12-03 (×2): qty 3

## 2023-12-03 MED ORDER — PREDNISONE 20 MG PO TABS: 40.0000 mg | ORAL_TABLET | Freq: Every day | ORAL | 0 refills | Status: AC

## 2023-12-03 MED ORDER — IPRATROPIUM-ALBUTEROL 0.5-2.5 (3) MG/3ML IN SOLN
3.0000 mL | Freq: Once | RESPIRATORY_TRACT | Status: AC
  Administered 2023-12-03: 3 mL via RESPIRATORY_TRACT
  Filled 2023-12-03: qty 3

## 2023-12-03 MED ORDER — MAGNESIUM SULFATE 2 GM/50ML IV SOLN
2.0000 g | Freq: Once | INTRAVENOUS | Status: AC
  Administered 2023-12-03: 2 g via INTRAVENOUS
  Filled 2023-12-03: qty 50

## 2023-12-03 MED ORDER — METHYLPREDNISOLONE SODIUM SUCC 125 MG IJ SOLR
125.0000 mg | Freq: Once | INTRAMUSCULAR | Status: AC
  Administered 2023-12-03: 125 mg via INTRAVENOUS
  Filled 2023-12-03: qty 2

## 2023-12-03 MED ORDER — ALBUTEROL SULFATE HFA 108 (90 BASE) MCG/ACT IN AERS
2.0000 | INHALATION_SPRAY | Freq: Four times a day (QID) | RESPIRATORY_TRACT | 3 refills | Status: DC | PRN
Start: 2023-12-03 — End: 2024-01-10

## 2023-12-03 MED ORDER — IPRATROPIUM-ALBUTEROL 0.5-2.5 (3) MG/3ML IN SOLN: 3.0000 mL | Freq: Four times a day (QID) | RESPIRATORY_TRACT | 0 refills | Status: DC | PRN

## 2023-12-03 NOTE — Discharge Instructions (Addendum)
Take next dose of prednisone tomorrow.  Take breathing treatment either nebulizer or albuterol inhaler every 4 hours for next 24 hours and then as needed.  Return if symptoms worsen.

## 2023-12-03 NOTE — ED Provider Notes (Addendum)
EMERGENCY DEPARTMENT AT [facility] Provider Note   CSN: [id number] Arrival date & time: 12/03/23  8469     History  Chief Complaint  Patient presents with   Shortness of Breath    [name] is a 73 y.o. female.  Patient here shortness of breath.  History of asthma.  Symptoms got more severe this morning.  She denies any fever chills or sick contacts.  No chest pain shortness of breath nausea vomiting diarrhea.  Took her inhaler without much relief.  History of hypertension high cholesterol COPD smoking.  No recent surgery or travel.  The history is provided by the patient.       Home Medications Prior to Admission medications   Medication Sig Start Date End Date Taking? Authorizing Provider  predniSONE (DELTASONE) 20 MG tablet Take 2 tablets (40 mg total) by mouth daily for 4 days. 12/03/23 12/07/23 Yes Anayansi Rundquist, DO  acetaminophen (TYLENOL) 325 MG tablet Take 2 tablets (650 mg total) by mouth every 6 (six) hours as needed for mild pain (or Fever >/= 101). 08/23/22   Marolyn Haller, MD  albuterol (VENTOLIN HFA) 108 (90 Base) MCG/ACT inhaler Inhale 2 puffs into the lungs every 6 (six) hours as needed for wheezing or shortness of breath. 12/03/23   Ayaan Shutes, DO  amLODipine (NORVASC) 10 MG tablet Take 1 tablet (10 mg total) by mouth daily. 12/13/22 12/13/23  [name], MD  azelastine (ASTELIN) 0.1 % nasal spray Place 2 sprays into both nostrils 2 (two) times daily. Use in each nostril as directed 03/04/23   [name], MD  DULoxetine (CYMBALTA) 60 MG capsule Take 1 capsule (60 mg total) by mouth daily. 01/10/23 01/10/24  Willette Cluster, MD  fexofenadine [facility] ALLERGY) 60 MG tablet Take 1 tablet (60 mg total) by mouth 2 (two) times daily. 03/03/23   [name], MD  fluticasone (FLONASE) 50 MCG/ACT nasal spray Place 1 spray into both nostrils daily. 07/14/23 07/13/24  Nooruddin, Jason Fila, MD  ipratropium-albuterol (DUONEB) 0.5-2.5  (3) MG/3ML SOLN Take 3 mLs by nebulization every 6 (six) hours as needed. 12/03/23   Dulse Rutan, DO  loratadine (CLARITIN) 10 MG tablet Take 1 tablet (10 mg total) by mouth daily. 11/23/22   Rana Snare, DO  losartan-hydrochlorothiazide (HYZAAR) 50-12.5 MG tablet Take 1 tablet by mouth 2 (two) times daily. 03/30/23   [provider]  metoprolol tartrate (LOPRESSOR) 25 MG tablet Take 1 tablet (25 mg total) by mouth 2 (two) times daily. 12/13/22 12/13/23  [name], MD  simvastatin (ZOCOR) 40 MG tablet Take 1 tablet (40 mg total) by mouth every evening. 12/13/22 01/12/23  [name], MD      Allergies    Ace inhibitors    Review of Systems   Review of Systems  Physical Exam Updated Vital Signs BP (!) 196/83   Pulse 99   Temp 98.6 F (37 C) (Oral)   Resp 16   Ht 5\' 5"  (1.651 m)   Wt 66.7 kg   SpO2 97%   BMI 24.46 kg/m  Physical Exam Vitals and nursing note reviewed.  Constitutional:      General: She is not in acute distress.    Appearance: She is well-developed. She is not ill-appearing.  HENT:     Head: Normocephalic and atraumatic.     Mouth/Throat:     Mouth: Mucous membranes are moist.  Eyes:     Extraocular Movements: Extraocular movements intact.     Conjunctiva/sclera: Conjunctivae normal.  Pupils: Pupils are equal, round, and reactive to light.  Cardiovascular:     Rate and Rhythm: Normal rate and regular rhythm.     Pulses: Normal pulses.     Heart sounds: Normal heart sounds. No murmur heard. Pulmonary:     Effort: Tachypnea present. No respiratory distress.     Breath sounds: Decreased breath sounds and wheezing present.  Abdominal:     Palpations: Abdomen is soft.     Tenderness: There is no abdominal tenderness.  Musculoskeletal:        General: No swelling.     Cervical back: Normal range of motion and neck supple.  Skin:    General: Skin is warm and dry.     Capillary Refill: Capillary refill takes less than 2 seconds.   Neurological:     Mental Status: She is alert.  Psychiatric:        Mood and Affect: Mood normal.     ED Results / Procedures / Treatments   Labs (all labs ordered are listed, but only abnormal results are displayed) Labs Reviewed  BASIC METABOLIC PANEL - Abnormal; Notable for the following components:      Result Value   Calcium 8.7 (*)    All other components within normal limits  RESP PANEL BY RT-PCR (RSV, FLU A&B, COVID)  [id number]  CBC WITH DIFFERENTIAL/PLATELET    EKG EKG Interpretation Date/Time:  Saturday December 03 2023 09:26:11 EST Ventricular Rate:  111 PR Interval:  145 QRS Duration:  90 QT Interval:  327 QTC Calculation: 445 R Axis:   74  Text Interpretation: Sinus tachycardia Biatrial enlargement Anterior infarct, old Confirmed by Virgina Norfolk [id number]) on 12/03/2023 9:46:54 AM  Radiology DG Chest Portable 1 View Result Date: 12/03/2023 CLINICAL DATA:  Shortness of breath. EXAM: PORTABLE CHEST 1 VIEW COMPARISON:  08/14/2022 FINDINGS: The heart size and mediastinal contours are within normal limits. Stable emphysematous lung disease and pulmonary hyperinflation. There is no evidence of pulmonary edema, consolidation, pneumothorax, nodule or pleural fluid. Stable evidence of prior right breast surgery and axillary nodal dissection. Stable healed old right-sided rib fractures. IMPRESSION: No acute findings. Stable emphysematous lung disease and pulmonary hyperinflation. Electronically Signed   By: Irish Lack M.D.   On: 12/03/2023 10:48    Procedures Procedures    Medications Ordered in ED Medications  ipratropium-albuterol (DUONEB) 0.5-2.5 (3) MG/3ML nebulizer solution 3 mL (3 mLs Nebulization Given 12/03/23 1024)  methylPREDNISolone sodium succinate (SOLU-MEDROL) 125 mg/2 mL injection 125 mg (125 mg Intravenous Given 12/03/23 0941)  magnesium sulfate IVPB 2 g 50 mL (0 g Intravenous Stopped 12/03/23 1059)  ipratropium-albuterol (DUONEB) 0.5-2.5 (3) MG/3ML  nebulizer solution 3 mL (3 mLs Nebulization Given 12/03/23 1154)    ED Course/ Medical Decision Making/ A&P                                 Medical Decision Making Amount and/or Complexity of Data Reviewed Labs: ordered. Radiology: ordered.  Risk Prescription drug management.   [name] is here with shortness of breath history of COPD and asthma and smoking history.  History of depression anxiety.  Increased work of breathing mild tachycardia mild hypertension but otherwise unremarkable vitals.  No fever.  Differential diagnosis likely COPD exacerbation may be pneumonia bronchitis/viral process.  Seems less likely to be ACS or PE and have no concern for those processes at this time as she has increased work of breathing  and wheezing and tightness on exam.  Will do breathing treatment steroids magnesium check basic labs COVID and flu test and chest x-ray and reevaluate.  She is not hypoxic.  Per my review and interpretation labs no significant anemia or electrolyte abnormality kidney injury or leukocytosis.  Chest x-ray negative for pneumonia.  COVID flu and RSV testing negative.  She is feeling much better after breathing treatments.  Shared decision was made for further treatment outpatient.   Patient did feel short of breath when she ambulated but her pulse ox did not drop.  She did feel comfortable going home but I did offer her admission but overall shared decision made for her to continue treatment outpatient.  She will continue steroids inhalers at home.  Told to return if symptoms worsen.  Discharged in good condition.    This chart was dictated using voice recognition software.  Despite best efforts to proofread,  errors can occur which can change the documentation meaning.         Final Clinical Impression(s) / ED Diagnoses Final diagnoses:  COPD exacerbation (HCC)    Rx / DC Orders ED Discharge Orders          Ordered    predniSONE (DELTASONE) 20 MG tablet   Daily        12/03/23 1257    albuterol (PROVENTIL) (2.5 MG/3ML) 0.083% nebulizer solution  Every 4 hours PRN,   Status:  Discontinued        12/03/23 1257    albuterol (VENTOLIN HFA) 108 (90 Base) MCG/ACT inhaler  Every 6 hours PRN        12/03/23 1257    ipratropium-albuterol (DUONEB) 0.5-2.5 (3) MG/3ML SOLN  Every 6 hours PRN        12/03/23 1258              Virgina Norfolk, DO 12/03/23 1302    Virgina Norfolk, DO 12/03/23 1344

## 2023-12-03 NOTE — ED Triage Notes (Signed)
Pt states waking up this morning and feeling short of breaths. Hx: asthma and anxiety. Denies n/v, chest pain. Took albuterol this morning with no relief

## 2023-12-05 ENCOUNTER — Telehealth: Payer: Self-pay | Admitting: General Practice

## 2023-12-05 NOTE — Telephone Encounter (Signed)
albuterol (VENTOLIN HFA) 108 (90 Base) MCG/ACT inhaler   [id number] - [place], [place] - [address] AT [address]

## 2023-12-05 NOTE — Telephone Encounter (Signed)
I called pt - informed pt the doctor she saw in the ED on 12/03/23 had sent in rxs for Albuterol inhaler and duo nebs. Then she state she had already picked up the meds.  Stated she's back on track with [place]; she will be our pt again. I asked Mickle Plumb to assign a PCP if this is correct.

## 2023-12-15 ENCOUNTER — Encounter: Payer: Medicare Other | Admitting: Internal Medicine

## 2023-12-15 NOTE — Progress Notes (Deleted)
 Subjective:  CC: ed follow-up  HPI:  [name] is a 73 y.o. female with a past medical history of COPD, type 2 diabetes, hyperlipidemia who presents today for ED follow-up.  She was seen January 25 for shortness of breath.  He was not hypoxic.  COVID flu RSV testing negative chest x-ray did not look concerning for pneumonia.  She felt better after breathing treatment.  He was discharged from emergency room with prednisone  40 mg for 4 additional days.  Please see problem based assessment and plan for additional details.  Past Medical History:  Diagnosis Date   Acute sinusitis 01/09/2019   Anxiety    Arthritis    fingers, knees (08/16/2018)   Asthma    Atopic dermatitis 03/20/2018   Axillary hidradenitis suppurativa 10/13/2018   Cancer of right breast (HCC) 1991   s/p lumpectomy, chemotherapy and radiation therapy in 1991. Mammogram in 2007 was normal.   Cellulitis of buttock, left 12/13/2022   Constipated    h/o   COPD (chronic obstructive pulmonary disease) (HCC)    History of multiple hospital admissions for exercabation    COPD exacerbation (HCC) 01/01/2019   COPD with acute exacerbation (HCC) 01/14/2019   COPD with exacerbation (HCC) 04/06/2009   Qualifier: Diagnosis of  By: Loletta MD, Vijay     Depression    Diarrhea    h/o   Facial cellulitis 08/30/2022   Facial edema 08/31/2022   GERD (gastroesophageal reflux disease)    Headache    a few times/month (08/16/2018   Heart murmur 10/05/2011   first time I ever heard I had one was today   History of breast cancer 12/01/2012   Pt with h/o breast CA s/p lumpectomy with chemo/radiation in 1991. Pt mammogram 2011 was unremarkable. Had CT chest 10/2012 in ED for SOB and showed spiculated nodule with lymph node. 12/04/12: Birads 2; repeat diagnostic mammogram in 1 year.     Hyperlipidemia    Hypertension    Lower extremity edema 09/21/2018   Obesity    Personal history of chemotherapy    Personal  history of radiation therapy    Pneumonia    couple times in the last 10-15 yrs (08/16/2018)   QT prolongation 08/08/2014   Seasonal allergies 02/25/2017   Shortness of breath 10/05/2011   at rest; lying down; w/exertion   Sigmoid diverticulitis 80/2008   Tobacco abuse    Type 2 diabetes mellitus (HCC) 05/14/2009   Type II diabetes mellitus (HCC)     MEDICATIONS:  Fluticasone  nasal spray Losartan -hydrochlorothiazide  50-12.5 mg BID Amlodipine  10 mg Loratidine 10 mg Metoprolol  tartrate 25 mg bid Simvastatin  40 mg Fenofenadine 60 mg bid Duloxetine  60 mg  Family History  Problem Relation Age of Onset   Cancer Mother        unknown   Breast cancer Daughter 3   Breast cancer Daughter        67s   BRCA 1/2 Neg Hx     Past Surgical History:  Procedure Laterality Date   ABDOMINAL HYSTERECTOMY     ANTERIOR CERVICAL DECOMP/DISCECTOMY FUSION  2012   Dr. Beuford  put plate in; did something to my vertebrae   BACK SURGERY     BREAST LUMPECTOMY Right 1991   DOBUTAMINE  STRESS ECHO  08/2004   Inferior ischemia, normal LV systolic function, no significant CAD   MINOR IRRIGATION AND DEBRIDEMENT OF WOUND Right 09/04/2022   Procedure: IRRIGATION AND DEBRIDEMENT OF NASAL WOUND;  Surgeon: Luciano Standing, MD;  Location: [facility] OR;  Service: ENT;  Laterality: Right;     Social History   Socioeconomic History   Marital status: Married    Spouse name: Not on file   Number of children: Not on file   Years of education: 12   Highest education level: Not on file  Occupational History    Employer: UNEMPLOYED  Tobacco Use   Smoking status: Every Day    Current packs/day: 0.50    Average packs/day: 0.5 packs/day for 45.0 years (22.5 ttl pk-yrs)    Types: Cigarettes   Smokeless tobacco: Never   Tobacco comments:    1/2 pk per day  Vaping Use   Vaping status: Never Used  Substance and Sexual Activity   Alcohol  use: Yes    Alcohol /week: 4.0 standard drinks of alcohol     Types: 4  Cans of beer per week    Comment: 08/16/2018 weekends only   Drug use: No   Sexual activity: Yes  Other Topics Concern   Not on file  Social History Narrative   Lived in [place] with her husband.   Takes care of 3 grand children.   Trying to find a job, has financial difficulties.      Social Drivers of Corporate Investment Banker Strain: Low Risk  (12/13/2022)   Overall Financial Resource Strain (CARDIA)    Difficulty of Paying Living Expenses: Not hard at all  Food Insecurity: No Food Insecurity (12/13/2022)   Hunger Vital Sign    Worried About Running Out of Food in the Last Year: Never true    Ran Out of Food in the Last Year: Never true  Transportation Needs: No Transportation Needs (12/13/2022)   PRAPARE - Administrator, Civil Service (Medical): No    Lack of Transportation (Non-Medical): No  Physical Activity: Inactive (10/31/2023)   Exercise Vital Sign    Days of Exercise per Week: 0 days    Minutes of Exercise per Session: 0 min  Stress: Stress Concern Present (10/31/2023)   [employer] of Occupational Health - Occupational Stress Questionnaire    Feeling of Stress : To some extent  Social Connections: Unknown (12/13/2022)   Social Connection and Isolation Panel [NHANES]    Frequency of Communication with Friends and Family: Patient declined    Frequency of Social Gatherings with Friends and Family: Patient declined    Attends Religious Services: Patient declined    Database Administrator or Organizations: Patient declined    Attends Banker Meetings: Patient declined    Marital Status: Married  Catering Manager Violence: Not At Risk (12/13/2022)   Humiliation, Afraid, Rape, and Kick questionnaire    Fear of Current or Ex-Partner: No    Emotionally Abused: No    Physically Abused: No    Sexually Abused: No    Review of Systems: ROS negative except for what is noted on the assessment and plan.  Objective:  There were no vitals filed  for this visit.  Physical Exam: Constitutional: well-appearing *** sitting in ***, in no acute distress HENT: normocephalic atraumatic, mucous membranes moist Eyes: conjunctiva non-erythematous Neck: supple Cardiovascular: regular rate and rhythm, no m/r/g Pulmonary/Chest: normal work of breathing on room air, lungs clear to auscultation bilaterally Abdominal: soft, non-tender, non-distended MSK: normal bulk and tone Neurological: alert & oriented x 3, 5/5 strength in bilateral upper and lower extremities, normal gait Skin: warm and dry Psych: ***     Assessment & Plan:  No problem-specific Assessment &  Plan notes found for this encounter.   COPD Seen in ED recently for COPD exacerbation.  She continues to smoke.  Currently only uses albuterol  inhaler and has DuoNebs at home.  PFT last completed in 2016 and showed mild obstruction. P: Start LAMA inhaler with Spiriva   DM  Lab Results  Component Value Date   HGBA1C 5.0 03/28/2023   Lab Results  Component Value Date   CHOL 196 03/04/2021   HDL 61 03/04/2021   LDLCALC 110 (H) 03/04/2021   TRIG 145 03/04/2021   CHOLHDL 3.2 03/04/2021     P: POC A1c Lipid panel Urine microalbumin Referral to ophthalmology  Patient discussed with Dr. Lovie Pagan Ona Roehrs, D.O. [facility]  PGY-3 Pager: [phone]  Phone: [phone] Date 12/15/2023  Time 8:14 AM

## 2024-01-03 ENCOUNTER — Ambulatory Visit: Payer: Self-pay | Admitting: Licensed Clinical Social Worker

## 2024-01-03 NOTE — Patient Outreach (Signed)
 Care Coordination   Follow Up Visit Note   01/03/2024 Name: [name] MRN: [id number] DOB: 1951/02/03  [name] is a 73 y.o. year old female who sees Christie Crocker, MD for primary care. I spoke with  [name] / [name] , spouse of client, via phone today to discuss client needs.   What matters to the patients health and wellness today? Patient has right knee pain. She has health management needs    Goals Addressed             This Visit's Progress    Patient has right knee pain. she spoke of health management needs       Interventions:  Spoke with [name], spouse of client,  via phone today. Discussed client needs and status Discussed medication procurement of client Discussed upcoming medical appointments of client Discussed sleeping issues of client. [name] said that client sleeps some during the day Discussed walking of client. Sometimes client is fatigued when walking. Client may get short of breath when walking. [name] said client is not using a device to help her in walking Regarding relaxation techniques of client, she likes to cook to help her relax. She also likes working with crafts to help her relax. She watches TV, listens to music, and likes to speak via phone with relatives and friends Discussed client transport needs. [name] said he transports client to and from client appointments Discussed vision of client. She wears glasses to help her with vision.  Discussed program support with RN, LCSW, Pharmacist. Encouraged [name] to access program support services if needed Discussed client use of inhaler and nebulizer. [name] said that client uses nebulizer for treatment about one time per day. Discussed client support with PCP [name] said that daughters live in area and are supportive of client Discussed pain issues. [name] said client has knee pain and foot pain issues [name] for phone call with LCSW today Encouraged  client  or [name] to call LCSW as needed for SW support for client at [phone].          SDOH assessments and interventions completed:  Yes  SDOH Interventions Today    Flowsheet Row Most Recent Value  SDOH Interventions   Depression Interventions/Treatment  Counseling  Physical Activity Interventions Other (Comments)  [has mobility challenges sometimes. Gets SOB occasionally on walking. Fatigues occasionally]  Stress Interventions Other (Comment)  [has stress in managing medical needs]        Care Coordination Interventions:  Yes, provided   Interventions Today    Flowsheet Row Most Recent Value  Chronic Disease   Chronic disease during today's visit Other  [spoke with [name], spouse of client, about client needs]  General Interventions   General Interventions Discussed/Reviewed General Interventions Discussed, Community Resources  Education Interventions   Education Provided Provided Education  Provided Verbal Education On [employer]  Mental Health Interventions   Mental Health Discussed/Reviewed Coping Strategies  [client uses relaxation techniques to help her cope with stress issues (craft activities, cooking, TV, listens to music)]  Nutrition Interventions   Nutrition Discussed/Reviewed Nutrition Discussed  Pharmacy Interventions   Pharmacy Dicussed/Reviewed Pharmacy Topics Discussed  Safety Interventions   Safety Discussed/Reviewed Fall Risk        Follow up plan: Follow up call scheduled for 03/07/24 at 11:00 AM    Encounter Outcome:  Patient Visit Completed    [name]  MSW, LCSW [place] Licensed Clinical Social  Worker Warehouse manager:  [phone] Fax:  [phone] Website:  [email or web address]

## 2024-01-03 NOTE — Patient Instructions (Signed)
 Visit Information  Thank you for taking time to visit with me today. Please don't hesitate to contact me if I can be of assistance to you.   Following are the goals we discussed today:   Goals Addressed             This Visit's Progress    Patient has right knee pain. she spoke of health management needs       Interventions:  Spoke with [name], spouse of client,  via phone today. Discussed client needs and status Discussed medication procurement of client Discussed upcoming medical appointments of client Discussed sleeping issues of client. [name] said that client sleeps some during the day Discussed walking of client. Sometimes client is fatigued when walking. Client may get short of breath when walking. [name] said client is not using a device to help her in walking Regarding relaxation techniques of client, she likes to cook to help her relax. She also likes working with crafts to help her relax. She watches TV, listens to music, and likes to speak via phone with relatives and friends Discussed client transport needs. [name] said he transports client to and from client appointments Discussed vision of client. She wears glasses to help her with vision.  Discussed program support with RN, LCSW, Pharmacist. Encouraged Ilia to access program support services if needed Discussed client use of inhaler and nebulizer. [name] said that client uses nebulizer for treatment about one time per day. Discussed client support with PCP [name] said that daughters live in area and are supportive of client Discussed pain issues. [name] said client has knee pain and foot pain issues Velta Addison for phone call with LCSW today Encouraged client  or Joli Koob to call LCSW as needed for SW support for client at [phone].          Our next appointment is by telephone on 03/07/24 at 11:00 AM   Please call the care guide team at [phone] if you need to cancel or reschedule your  appointment.   If you are experiencing a Mental Health or Behavioral Health Crisis or need someone to talk to, please go to [facility] [address] [place] [phone])   The patient / Monroe Toure, spouse, verbalized understanding of instructions, educational materials, and care plan provided today and DECLINED offer to receive copy of patient instructions, educational materials, and care plan.   The patient / Kassondra Geil, spouse, has been provided with contact information for the care management team and has been advised to call with any health related questions or concerns.    Lorna Few  MSW, LCSW [place] Licensed Clinical Social Worker Direct Dial:  [phone] Fax:  [phone] Website:  [email or web address]

## 2024-01-10 ENCOUNTER — Ambulatory Visit: Admitting: Student

## 2024-01-10 DIAGNOSIS — J42 Unspecified chronic bronchitis: Secondary | ICD-10-CM

## 2024-01-10 DIAGNOSIS — J441 Chronic obstructive pulmonary disease with (acute) exacerbation: Secondary | ICD-10-CM | POA: Diagnosis not present

## 2024-01-10 DIAGNOSIS — F17210 Nicotine dependence, cigarettes, uncomplicated: Secondary | ICD-10-CM

## 2024-01-10 MED ORDER — TRELEGY ELLIPTA 100-62.5-25 MCG/ACT IN AEPB: 1.0000 | INHALATION_SPRAY | Freq: Every day | RESPIRATORY_TRACT | 11 refills | Status: DC

## 2024-01-10 MED ORDER — ALBUTEROL SULFATE HFA 108 (90 BASE) MCG/ACT IN AERS: 2.0000 | INHALATION_SPRAY | Freq: Four times a day (QID) | RESPIRATORY_TRACT | 3 refills | Status: AC | PRN

## 2024-01-10 NOTE — Progress Notes (Signed)
 [facility] Internal Medicine Residency Telephone Encounter Continuity Care Appointment  HPI:  This telephone encounter was created for [name] on 01/10/2024 for the following purpose/cc upper respiratory tract infection.   Past Medical History:  Past Medical History:  Diagnosis Date   Acute sinusitis 01/09/2019   Anxiety    Arthritis    "fingers, knees" (08/16/2018)   Asthma    Atopic dermatitis 03/20/2018   Axillary hidradenitis suppurativa 10/13/2018   Cancer of right breast (HCC) 1991   s/p lumpectomy, chemotherapy and radiation therapy in 1991. Mammogram in 2007 was normal.   Cellulitis of buttock, left 12/13/2022   Constipated    h/o   COPD (chronic obstructive pulmonary disease) (HCC)    History of multiple hospital admissions for exercabation    COPD exacerbation (HCC) 01/01/2019   COPD with acute exacerbation (HCC) 01/14/2019   COPD with exacerbation (HCC) 04/06/2009   Qualifier: Diagnosis of  By: Comer Locket MD, Vijay     Depression    Diarrhea    h/o   Facial cellulitis 08/30/2022   Facial edema 08/31/2022   GERD (gastroesophageal reflux disease)    Headache    "a few times/month" (08/16/2018   Heart murmur 10/05/2011   "first time I ever heard I had one was today"   History of breast cancer 12/01/2012   Pt with h/o breast CA s/p lumpectomy with chemo/radiation in 1991. Pt mammogram 2011 was unremarkable. Had CT chest 10/2012 in ED for SOB and showed spiculated nodule with lymph node. 12/04/12: Birads 2; repeat diagnostic mammogram in 1 year.     Hyperlipidemia    Hypertension    Lower extremity edema 09/21/2018   Obesity    Personal history of chemotherapy    Personal history of radiation therapy    Pneumonia    "couple times in the last 10-15 yrs" (08/16/2018)   QT prolongation 08/08/2014   Seasonal allergies 02/25/2017   Shortness of breath 10/05/2011   "at rest; lying down; w/exertion"   Sigmoid diverticulitis 80/2008   Tobacco abuse    Type  2 diabetes mellitus (HCC) 05/14/2009   Type II diabetes mellitus (HCC)      Assessment / Plan / Recommendations:  COPD (chronic obstructive pulmonary disease) (HCC) Telehealth visit today to discuss recent likely viral URI symptoms with congestion but no fevers, dyspnea, worsening cough, productive cough, or other symptoms consistent with pneumonia or COPD exacerbation.  Unfortunately she is not on any maintenance therapy for her COPD after the cost of her daily inhaler became too high.  She was previously on Trelegy and is only currently using albuterol inhaler as needed.  She is also still smoking but has cut back some since her last visit.  Discussed the importance of maintenance therapy for COPD and smoking cessation.  She is willing to come in for an in person visit in 1 month to discuss Chantix but does not want to try right now. - Start Trelegy 100-62.5-25 mcg 1 puff daily - Continue albuterol as needed - Return precautions discussed    As always, pt is advised that if symptoms worsen or new symptoms arise, they should go to an urgent care facility or to to ER for further evaluation.   Consent and Medical Decision Making:  Patient discussed with Dr.  Deirdre Priest This is a telephone encounter between [name] and Rocky Morel on 01/10/2024 for upper respiratory tract infection. The visit was conducted with the patient located at home and Rocky Morel at  [facility]. The patient's identity was confirmed using their DOB and current address. The patient has consented to being evaluated through a telephone encounter and understands the associated risks (an examination cannot be done and the patient may need to come in for an appointment) / benefits (allows the patient to remain at home, decreasing exposure to coronavirus). I personally spent 20 minutes on medical discussion.

## 2024-01-10 NOTE — Assessment & Plan Note (Signed)
 Telehealth visit today to discuss recent likely viral URI symptoms with congestion but no fevers, dyspnea, worsening cough, productive cough, or other symptoms consistent with pneumonia or COPD exacerbation.  Unfortunately she is not on any maintenance therapy for her COPD after the cost of her daily inhaler became too high.  She was previously on Trelegy and is only currently using albuterol inhaler as needed.  She is also still smoking but has cut back some since her last visit.  Discussed the importance of maintenance therapy for COPD and smoking cessation.  She is willing to come in for an in person visit in 1 month to discuss Chantix but does not want to try right now. - Start Trelegy 100-62.5-25 mcg 1 puff daily - Continue albuterol as needed - Return precautions discussed

## 2024-01-10 NOTE — Progress Notes (Signed)
 Internal Medicine Clinic Attending  Case discussed with the resident at the time of the visit.  We reviewed the resident's history and exam and pertinent patient test results.  I agree with the assessment, diagnosis, and plan of care documented in the resident's note.

## 2024-01-12 ENCOUNTER — Observation Stay (HOSPITAL_COMMUNITY)
Admission: EM | Admit: 2024-01-12 | Discharge: 2024-01-13 | Disposition: A | Discharge status: Home / Self Care | Attending: Family Medicine | Admitting: Family Medicine

## 2024-01-12 ENCOUNTER — Other Ambulatory Visit: Payer: Self-pay

## 2024-01-12 ENCOUNTER — Emergency Department (HOSPITAL_COMMUNITY)

## 2024-01-12 ENCOUNTER — Encounter (HOSPITAL_COMMUNITY): Payer: Self-pay | Admitting: Internal Medicine

## 2024-01-12 DIAGNOSIS — R918 Other nonspecific abnormal finding of lung field: Secondary | ICD-10-CM | POA: Diagnosis not present

## 2024-01-12 DIAGNOSIS — E785 Hyperlipidemia, unspecified: Secondary | ICD-10-CM | POA: Diagnosis not present

## 2024-01-12 DIAGNOSIS — E118 Type 2 diabetes mellitus with unspecified complications: Secondary | ICD-10-CM | POA: Diagnosis present

## 2024-01-12 DIAGNOSIS — E1140 Type 2 diabetes mellitus with diabetic neuropathy, unspecified: Secondary | ICD-10-CM | POA: Diagnosis not present

## 2024-01-12 DIAGNOSIS — Z79899 Other long term (current) drug therapy: Secondary | ICD-10-CM | POA: Insufficient documentation

## 2024-01-12 DIAGNOSIS — Z87891 Personal history of nicotine dependence: Secondary | ICD-10-CM | POA: Insufficient documentation

## 2024-01-12 DIAGNOSIS — F17200 Nicotine dependence, unspecified, uncomplicated: Secondary | ICD-10-CM | POA: Diagnosis present

## 2024-01-12 DIAGNOSIS — Z853 Personal history of malignant neoplasm of breast: Secondary | ICD-10-CM | POA: Diagnosis not present

## 2024-01-12 DIAGNOSIS — Z720 Tobacco use: Secondary | ICD-10-CM | POA: Diagnosis present

## 2024-01-12 DIAGNOSIS — I110 Hypertensive heart disease with heart failure: Secondary | ICD-10-CM | POA: Insufficient documentation

## 2024-01-12 DIAGNOSIS — I10 Essential (primary) hypertension: Secondary | ICD-10-CM | POA: Diagnosis present

## 2024-01-12 DIAGNOSIS — E876 Hypokalemia: Secondary | ICD-10-CM | POA: Diagnosis present

## 2024-01-12 DIAGNOSIS — J441 Chronic obstructive pulmonary disease with (acute) exacerbation: Secondary | ICD-10-CM | POA: Diagnosis not present

## 2024-01-12 DIAGNOSIS — I5030 Unspecified diastolic (congestive) heart failure: Secondary | ICD-10-CM | POA: Insufficient documentation

## 2024-01-12 DIAGNOSIS — J9600 Acute respiratory failure, unspecified whether with hypoxia or hypercapnia: Secondary | ICD-10-CM | POA: Diagnosis not present

## 2024-01-12 DIAGNOSIS — Z4789 Encounter for other orthopedic aftercare: Secondary | ICD-10-CM | POA: Diagnosis not present

## 2024-01-12 DIAGNOSIS — E1169 Type 2 diabetes mellitus with other specified complication: Secondary | ICD-10-CM | POA: Diagnosis present

## 2024-01-12 DIAGNOSIS — I5189 Other ill-defined heart diseases: Secondary | ICD-10-CM

## 2024-01-12 DIAGNOSIS — R0689 Other abnormalities of breathing: Secondary | ICD-10-CM | POA: Diagnosis present

## 2024-01-12 DIAGNOSIS — R0602 Shortness of breath: Secondary | ICD-10-CM | POA: Diagnosis present

## 2024-01-12 DIAGNOSIS — E1122 Type 2 diabetes mellitus with diabetic chronic kidney disease: Secondary | ICD-10-CM | POA: Diagnosis present

## 2024-01-12 HISTORY — DX: Other ill-defined heart diseases: I51.89

## 2024-01-12 LAB — HEPATIC FUNCTION PANEL
ALT: 11 U/L (ref 0–44)
AST: 19 U/L (ref 15–41)
Albumin: 3.7 g/dL (ref 3.5–5.0)
Alkaline Phosphatase: 96 U/L (ref 38–126)
Bilirubin, Direct: 0.2 mg/dL (ref 0.0–0.2)
Indirect Bilirubin: 0.8 mg/dL (ref 0.3–0.9)
Total Bilirubin: 1 mg/dL (ref 0.0–1.2)
Total Protein: 7 g/dL (ref 6.5–8.1)

## 2024-01-12 LAB — CBC
HCT: 40.5 % (ref 36.0–46.0)
Hemoglobin: 13.1 g/dL (ref 12.0–15.0)
MCH: 29.8 pg (ref 26.0–34.0)
MCHC: 32.3 g/dL (ref 30.0–36.0)
MCV: 92 fL (ref 80.0–100.0)
Platelets: 286 10*3/uL (ref 150–400)
RBC: 4.4 MIL/uL (ref 3.87–5.11)
RDW: 13.9 % (ref 11.5–15.5)
WBC: 4.2 10*3/uL (ref 4.0–10.5)
nRBC: 0 % (ref 0.0–0.2)

## 2024-01-12 LAB — CBG MONITORING, ED
Glucose-Capillary: 201 mg/dL — ABNORMAL HIGH (ref 70–99)
Glucose-Capillary: 112 mg/dL — ABNORMAL HIGH (ref 70–99)
Glucose-Capillary: 148 mg/dL — ABNORMAL HIGH (ref 70–99)

## 2024-01-12 LAB — MAGNESIUM: Magnesium: 1.8 mg/dL (ref 1.7–2.4)

## 2024-01-12 LAB — BASIC METABOLIC PANEL
Anion gap: 8 (ref 5–15)
BUN: 9 mg/dL (ref 8–23)
CO2: 25 mmol/L (ref 22–32)
Calcium: 8.8 mg/dL — ABNORMAL LOW (ref 8.9–10.3)
Chloride: 103 mmol/L (ref 98–111)
Creatinine, Ser: 0.64 mg/dL (ref 0.44–1.00)
GFR, Estimated: >60 mL/min (ref >60)
Glucose, Bld: 85 mg/dL (ref 70–99)
Potassium: 3.3 mmol/L — ABNORMAL LOW (ref 3.5–5.1)
Sodium: 136 mmol/L (ref 135–145)

## 2024-01-12 LAB — BRAIN NATRIURETIC PEPTIDE: B Natriuretic Peptide: 68.9 pg/mL (ref 0.0–100.0)

## 2024-01-12 LAB — TROPONIN I (HIGH SENSITIVITY)
Troponin I (High Sensitivity): 13 ng/L (ref <18)
Troponin I (High Sensitivity): 10 ng/L (ref <18)

## 2024-01-12 LAB — PHOSPHORUS: Phosphorus: 3.6 mg/dL (ref 2.5–4.6)

## 2024-01-12 MED ORDER — TRAZODONE HCL 100 MG PO TABS
50.0000 mg | ORAL_TABLET | Freq: Every evening | ORAL | Status: DC | PRN
Start: 2024-01-12 — End: 2024-01-13
  Administered 2024-01-12: 50 mg via ORAL
  Filled 2024-01-12: qty 1

## 2024-01-12 MED ORDER — ALBUTEROL SULFATE (2.5 MG/3ML) 0.083% IN NEBU
10.0000 mg/h | INHALATION_SOLUTION | Freq: Once | RESPIRATORY_TRACT | Status: AC
  Administered 2024-01-12: 10 mg/h via RESPIRATORY_TRACT
  Filled 2024-01-12: qty 12

## 2024-01-12 MED ORDER — POTASSIUM CHLORIDE CRYS ER 20 MEQ PO TBCR
40.0000 meq | EXTENDED_RELEASE_TABLET | Freq: Once | ORAL | Status: DC
  Filled 2024-01-12: qty 2

## 2024-01-12 MED ORDER — METOPROLOL TARTRATE 25 MG PO TABS
25.0000 mg | ORAL_TABLET | Freq: Two times a day (BID) | ORAL | Status: DC
  Administered 2024-01-12 – 2024-01-13 (×3): 25 mg via ORAL
  Filled 2024-01-12 (×3): qty 1

## 2024-01-12 MED ORDER — NICOTINE POLACRILEX 2 MG MT GUM: 2.0000 mg | CHEWING_GUM | OROMUCOSAL | Status: DC | PRN

## 2024-01-12 MED ORDER — LOSARTAN POTASSIUM-HCTZ 50-12.5 MG PO TABS: 1.0000 | ORAL_TABLET | Freq: Every day | ORAL | Status: DC

## 2024-01-12 MED ORDER — MAGNESIUM SULFATE 2 GM/50ML IV SOLN
2.0000 g | Freq: Once | INTRAVENOUS | Status: AC
  Administered 2024-01-12: 2 g via INTRAVENOUS
  Filled 2024-01-12: qty 50

## 2024-01-12 MED ORDER — HYDROCHLOROTHIAZIDE 12.5 MG PO TABS
12.5000 mg | ORAL_TABLET | Freq: Every day | ORAL | Status: DC
  Administered 2024-01-12 – 2024-01-13 (×2): 12.5 mg via ORAL
  Filled 2024-01-12 (×2): qty 1

## 2024-01-12 MED ORDER — LABETALOL HCL 5 MG/ML IV SOLN
10.0000 mg | Freq: Once | INTRAVENOUS | Status: AC
  Administered 2024-01-12: 10 mg via INTRAVENOUS
  Filled 2024-01-12: qty 4

## 2024-01-12 MED ORDER — ALBUTEROL SULFATE (2.5 MG/3ML) 0.083% IN NEBU
10.0000 mg/h | INHALATION_SOLUTION | Freq: Once | RESPIRATORY_TRACT | Status: AC
  Administered 2024-01-12: 10 mg/h via RESPIRATORY_TRACT
  Filled 2024-01-12: qty 12
  Filled 2024-01-12: qty 3

## 2024-01-12 MED ORDER — ACETAMINOPHEN 650 MG RE SUPP: 650.0000 mg | Freq: Four times a day (QID) | RECTAL | Status: DC | PRN

## 2024-01-12 MED ORDER — INSULIN ASPART 100 UNIT/ML IJ SOLN
0.0000 [IU] | Freq: Three times a day (TID) | INTRAMUSCULAR | Status: DC
  Filled 2024-01-12: qty 0.15

## 2024-01-12 MED ORDER — IPRATROPIUM BROMIDE 0.02 % IN SOLN
0.5000 mg | Freq: Once | RESPIRATORY_TRACT | Status: AC
  Administered 2024-01-12: 0.5 mg via RESPIRATORY_TRACT
  Filled 2024-01-12: qty 2.5

## 2024-01-12 MED ORDER — ONDANSETRON HCL 4 MG/2ML IJ SOLN
4.0000 mg | Freq: Four times a day (QID) | INTRAMUSCULAR | Status: DC | PRN
Start: 2024-01-12 — End: 2024-01-13

## 2024-01-12 MED ORDER — ACETAMINOPHEN 325 MG PO TABS: 650.0000 mg | ORAL_TABLET | Freq: Four times a day (QID) | ORAL | Status: DC | PRN

## 2024-01-12 MED ORDER — LOSARTAN POTASSIUM 50 MG PO TABS
50.0000 mg | ORAL_TABLET | Freq: Every day | ORAL | Status: DC
Start: 2024-01-12 — End: 2024-01-13
  Administered 2024-01-12 – 2024-01-13 (×2): 50 mg via ORAL
  Filled 2024-01-12 (×2): qty 1

## 2024-01-12 MED ORDER — PREDNISONE 20 MG PO TABS
40.0000 mg | ORAL_TABLET | Freq: Every day | ORAL | Status: DC
  Administered 2024-01-13: 40 mg via ORAL
  Filled 2024-01-12: qty 2

## 2024-01-12 MED ORDER — METHYLPREDNISOLONE SODIUM SUCC 125 MG IJ SOLR
125.0000 mg | Freq: Once | INTRAMUSCULAR | Status: AC
  Administered 2024-01-12: 125 mg via INTRAVENOUS
  Filled 2024-01-12: qty 2

## 2024-01-12 MED ORDER — ONDANSETRON HCL 4 MG PO TABS: 4.0000 mg | ORAL_TABLET | Freq: Four times a day (QID) | ORAL | Status: DC | PRN

## 2024-01-12 MED ORDER — IPRATROPIUM-ALBUTEROL 0.5-2.5 (3) MG/3ML IN SOLN
3.0000 mL | Freq: Four times a day (QID) | RESPIRATORY_TRACT | Status: DC
  Administered 2024-01-12 – 2024-01-13 (×3): 3 mL via RESPIRATORY_TRACT
  Filled 2024-01-12 (×3): qty 3

## 2024-01-12 NOTE — ED Provider Notes (Signed)
 [name] EMERGENCY DEPARTMENT AT [facility] Provider Note   CSN: [id number] Arrival date & time: 01/12/24  0747     History  Chief Complaint  Patient presents with   Shortness of Breath    [name] is a 73 y.o. female.  43-year-old female history of COPD not on oxygen presenting emergency department for several days of URI symptoms with worsening shortness of breath.  Seemingly acutely worsened this morning.  Breathing treatments at home have not alleviated her symptoms.  Not having chest pain.  No abdominal pain nausea vomiting.   Shortness of Breath      Home Medications Prior to Admission medications   Medication Sig Start Date End Date Taking? Authorizing Provider  acetaminophen (TYLENOL) 325 MG tablet Take 2 tablets (650 mg total) by mouth every 6 (six) hours as needed for mild pain (or Fever >/= 101). 08/23/22  Yes Marolyn Haller, MD  albuterol (PROVENTIL) (2.5 MG/3ML) 0.083% nebulizer solution Take 2.5 mg by nebulization every 4 (four) hours as needed. 12/05/23  Yes [provider]  albuterol (VENTOLIN HFA) 108 (90 Base) MCG/ACT inhaler Inhale 2 puffs into the lungs every 6 (six) hours as needed for wheezing or shortness of breath. 01/10/24  Yes Rocky Morel, DO  amLODipine (NORVASC) 10 MG tablet Take 1 tablet (10 mg total) by mouth daily. 12/13/22 01/12/24 Yes [name], MD  azelastine (ASTELIN) 0.1 % nasal spray Place 2 sprays into both nostrils 2 (two) times daily. Use in each nostril as directed 03/04/23   [name], MD  DULoxetine (CYMBALTA) 60 MG capsule Take 1 capsule (60 mg total) by mouth daily. 01/10/23 01/10/24  Willette Cluster, MD  fexofenadine [facility] ALLERGY) 60 MG tablet Take 1 tablet (60 mg total) by mouth 2 (two) times daily. 03/03/23   [name], MD  fluticasone (FLONASE) 50 MCG/ACT nasal spray Place 1 spray into both nostrils daily. 07/14/23 07/13/24  Nooruddin, Jason Fila, MD  Fluticasone-Umeclidin-Vilant  (TRELEGY ELLIPTA) 100-62.5-25 MCG/ACT AEPB Inhale 1 puff into the lungs daily. Patient not taking: Reported on 01/12/2024 01/10/24   Rocky Morel, DO  ipratropium-albuterol (DUONEB) 0.5-2.5 (3) MG/3ML SOLN Take 3 mLs by nebulization every 6 (six) hours as needed. Patient not taking: Reported on 01/12/2024 12/03/23   Virgina Norfolk, DO  loratadine (CLARITIN) 10 MG tablet Take 1 tablet (10 mg total) by mouth daily. 11/23/22   Rana Snare, DO  losartan-hydrochlorothiazide (HYZAAR) 50-12.5 MG tablet Take 1 tablet by mouth 2 (two) times daily. 03/30/23   [provider]  metoprolol tartrate (LOPRESSOR) 25 MG tablet Take 1 tablet (25 mg total) by mouth 2 (two) times daily. 12/13/22 01/12/24  [name], MD  simvastatin (ZOCOR) 40 MG tablet Take 1 tablet (40 mg total) by mouth every evening. 12/13/22 01/12/24  [name], MD      Allergies    Ace inhibitors    Review of Systems   Review of Systems  Respiratory:  Positive for shortness of breath.     Physical Exam Updated Vital Signs BP (!) 184/87   Pulse 93   Temp 98 F (36.7 C) (Oral)   Resp (!) 24   Ht 5\' 5"  (1.651 m)   Wt 66.7 kg   SpO2 99%   BMI 24.47 kg/m  Physical Exam Vitals and nursing note reviewed.  Constitutional:      General: She is not in acute distress.    Appearance: She is not toxic-appearing.  HENT:     Head: Normocephalic.  Cardiovascular:  Rate and Rhythm: Normal rate and regular rhythm.  Pulmonary:     Effort: Tachypnea, accessory muscle usage and respiratory distress present.     Breath sounds: Wheezing present.     Comments: Speaking in 1 and 2 word sentences. Musculoskeletal:     Cervical back: Normal range of motion.  Skin:    General: Skin is warm and dry.     Capillary Refill: Capillary refill takes less than 2 seconds.  Neurological:     Mental Status: She is alert and oriented to person, place, and time.  Psychiatric:        Mood and Affect: Mood normal.         Behavior: Behavior normal.     ED Results / Procedures / Treatments   Labs (all labs ordered are listed, but only abnormal results are displayed) Labs Reviewed  BASIC METABOLIC PANEL - Abnormal; Notable for the following components:      Result Value   Potassium 3.3 (*)    Calcium 8.8 (*)    All other components within normal limits  CBG MONITORING, ED - Abnormal; Notable for the following components:   Glucose-Capillary 201 (*)    All other components within normal limits  CBC  BRAIN NATRIURETIC PEPTIDE  MAGNESIUM  PHOSPHORUS  HEPATIC FUNCTION PANEL  HEMOGLOBIN A1C  TROPONIN I (HIGH SENSITIVITY)  TROPONIN I (HIGH SENSITIVITY)    EKG EKG Interpretation Date/Time:  Thursday January 12 2024 07:57:42 EST Ventricular Rate:  115 PR Interval:  145 QRS Duration:  90 QT Interval:  349 QTC Calculation: 483 R Axis:   70  Text Interpretation: Sinus tachycardia Ventricular premature complex Biatrial enlargement Anterior infarct, old ST depr, consider ischemia, inferior leads Confirmed by Estanislado Pandy [phone]) on 01/12/2024 10:29:46 AM  Radiology DG Chest Portable 1 View Result Date: 01/12/2024 CLINICAL DATA:  COPD exacerbation EXAM: PORTABLE CHEST 1 VIEW COMPARISON:  Chest radiograph dated 12/03/2023 FINDINGS: Hyperinflated lungs. No focal consolidations. Blunting of the right costophrenic angle. No pneumothorax. The heart size and mediastinal contours are within normal limits. Cervical spinal fixation hardware appears intact. Right axillary surgical clips. IMPRESSION: 1. Hyperinflated lungs, consistent with COPD. 2. Blunting of the right costophrenic angle, which may represent a small pleural effusion. Electronically Signed   By: Agustin Cree M.D.   On: 01/12/2024 08:52    Procedures .Critical Care  Performed by: Coral Spikes, DO Authorized by: Coral Spikes, DO   Critical care provider statement:    Critical care time (minutes):  30   Critical care was necessary to treat or  prevent imminent or life-threatening deterioration of the following conditions:  Respiratory failure   Critical care was time spent personally by me on the following activities:  Development of treatment plan with patient or surrogate, discussions with consultants, evaluation of patient's response to treatment, examination of patient, ordering and review of laboratory studies, ordering and review of radiographic studies, ordering and performing treatments and interventions, pulse oximetry, re-evaluation of patient's condition and review of old charts   Care discussed with: admitting provider       Medications Ordered in ED Medications  potassium chloride SA (KLOR-CON M) CR tablet 40 mEq (0 mEq Oral Patient Refused/Not Given 01/12/24 1033)  predniSONE (DELTASONE) tablet 40 mg (has no administration in time range)  acetaminophen (TYLENOL) tablet 650 mg (has no administration in time range)    Or  acetaminophen (TYLENOL) suppository 650 mg (has no administration in time range)  ondansetron (ZOFRAN) tablet 4 mg (  has no administration in time range)    Or  ondansetron (ZOFRAN) injection 4 mg (has no administration in time range)  insulin aspart (novoLOG) injection 0-15 Units (0 Units Subcutaneous Hold 01/12/24 1522)  nicotine polacrilex (NICORETTE) gum 2 mg (has no administration in time range)  metoprolol tartrate (LOPRESSOR) tablet 25 mg (25 mg Oral Given 01/12/24 1521)  ipratropium-albuterol (DUONEB) 0.5-2.5 (3) MG/3ML nebulizer solution 3 mL (0 mLs Nebulization Hold 01/12/24 1543)  losartan (COZAAR) tablet 50 mg (50 mg Oral Given 01/12/24 1522)    And  hydrochlorothiazide (HYDRODIURIL) tablet 12.5 mg (12.5 mg Oral Given 01/12/24 1521)  albuterol (PROVENTIL) (2.5 MG/3ML) 0.083% nebulizer solution (10 mg/hr Nebulization Given 01/12/24 0829)  ipratropium (ATROVENT) nebulizer solution 0.5 mg (0.5 mg Nebulization Given 01/12/24 0829)  methylPREDNISolone sodium succinate (SOLU-MEDROL) 125 mg/2 mL injection 125 mg  (125 mg Intravenous Given 01/12/24 0826)  albuterol (PROVENTIL) (2.5 MG/3ML) 0.083% nebulizer solution (10 mg/hr Nebulization Given 01/12/24 1018)  ipratropium (ATROVENT) nebulizer solution 0.5 mg (0.5 mg Nebulization Given 01/12/24 1018)  magnesium sulfate IVPB 2 g 50 mL (0 g Intravenous Stopped 01/12/24 1334)  labetalol (NORMODYNE) injection 10 mg (10 mg Intravenous Given 01/12/24 1443)    ED Course/ Medical Decision Making/ A&P Clinical Course as of 01/12/24 1553  Thu Jan 12, 2024  0913 Patient evaluated, regimen ongoing.  She is reporting improvement of symptoms, and is now able to talk in longer sentences. [TY]  1005 Patient reassessed, still having symptoms.  Work of breathing is improved, but still appears to be in mild to moderate distress.  Will give second hour-long breathing treatment.  Plan for admission for acute respiratory failure 2/2 COPD.  [TY]    Clinical Course User Index [TY] Coral Spikes, DO                                 Medical Decision Making This is a 73 year old female presenting emergency department for shortness of breath in the setting of COPD.  She is afebrile, she is tachycardic and hypertensive.  Was saturating 99% on room air.  Appears to be in moderate to severe respiratory distress, tripoding, poor air movement with diffuse wheezing, speaking in 1-2 word sentences.  Will give hour-long breathing treatment, get basic labs and chest x-ray.  See ED course for further MDM disposition.  Amount and/or Complexity of Data Reviewed External Data Reviewed:     Details: Does not appear to have admissions of for COPD in the last 6 months.. Labs: ordered. Decision-making details documented in ED Course.    Details: Patient with no leukocytosis to suggest systemic infection.  No significant metabolic derangements.  Normal kidney function.  Her troponin and BNP are not suggestive of a cardiac etiology of her symptoms today. Radiology: ordered and independent interpretation  performed.    Details: Chest x-ray without pneumonia pneumothorax on my independent interpretation. ECG/medicine tests: ordered. Decision-making details documented in ED Course.  Risk Prescription drug management. Decision regarding hospitalization. Diagnosis or treatment significantly limited by social determinants of health. Risk Details: Poor health literacy         Final Clinical Impression(s) / ED Diagnoses Final diagnoses:  None    Rx / DC Orders ED Discharge Orders     None         Coral Spikes, DO 01/12/24 1553

## 2024-01-12 NOTE — H&P (Signed)
 History and Physical    Patient: [name] ZOX:[id number] DOB: 12-31-1950 DOA: 01/12/2024 DOS: the patient was seen and examined on 01/12/2024 PCP: [name], MD  Patient coming from: Home  Chief Complaint:  Chief Complaint  Patient presents with   Shortness of Breath   HPI: [name] is a 73 y.o. female with medical history significant of grade 1 diastolic dysfunction, sinusitis, anxiety, depression, arthritis, right breast cancer, history of lumpectomy, chemo and radiation therapy, facial cellulitis, facial edema, GERD, heart murmur, hyperlipidemia, hypertension, obesity, history of QT prolongation, diverticulosis, sigmoid diverticulitis, type 2 diabetes, active tobacco use, asthma, atopic dermatitis, COPD, active smoker who presented to the emergency department with complaints of progressively worse dyspnea associated with mostly nonproductive cough, fatigue and wheezing that failed to respond to her bronchodilators at home.  She denied fever, chills, rhinorrhea, sore throat or hemoptysis.  No chest pain, palpitations, diaphoresis, PND, orthopnea or pitting edema of the lower extremities.  No abdominal pain, nausea, emesis, diarrhea, constipation, melena or hematochezia.  No flank pain, dysuria, frequency or hematuria.  No polyuria, polydipsia, polyphagia or blurred vision.   Lab work: CBC, first troponin and BNP were normal.  BMP showed a potassium of 3.3 mmol/L and a calcium level of 8.8 mg/dL.  Imaging: Portable 1 view chest radiograph showing hyperinflated lungs, consistent with COPD.  Blunting of the right costophrenic angle, which may represent a small pleural effusion.   ED course: Initial vital signs were temperature 98.3 F, pulse 123, respiration 24, BP 203/110 mmHg O2 sat 96%.  The patient received supplemental oxygen, albuterol 10+ ipratropium 0.5 mg continuous neb x 2, methylprednisolone 125 mg IVP and KCl 40 mEq p.o. x 1.  Review of Systems: As  mentioned in the history of present illness. All other systems reviewed and are negative. Past Medical History:  Diagnosis Date   Acute sinusitis 01/09/2019   Anxiety    Arthritis    "fingers, knees" (08/16/2018)   Asthma    Atopic dermatitis 03/20/2018   Axillary hidradenitis suppurativa 10/13/2018   Cancer of right breast (HCC) 1991   s/p lumpectomy, chemotherapy and radiation therapy in 1991. Mammogram in 2007 was normal.   Cellulitis of buttock, left 12/13/2022   Constipated    h/o   COPD (chronic obstructive pulmonary disease) (HCC)    History of multiple hospital admissions for exercabation    COPD exacerbation (HCC) 01/01/2019   COPD with acute exacerbation (HCC) 01/14/2019   COPD with exacerbation (HCC) 04/06/2009   Qualifier: Diagnosis of  By: Comer Locket MD, Vijay     Depression    Diarrhea    h/o   Facial cellulitis 08/30/2022   Facial edema 08/31/2022   GERD (gastroesophageal reflux disease)    Headache    "a few times/month" (08/16/2018   Heart murmur 10/05/2011   "first time I ever heard I had one was today"   History of breast cancer 12/01/2012   Pt with h/o breast CA s/p lumpectomy with chemo/radiation in 1991. Pt mammogram 2011 was unremarkable. Had CT chest 10/2012 in ED for SOB and showed spiculated nodule with lymph node. 12/04/12: Birads 2; repeat diagnostic mammogram in 1 year.     Hyperlipidemia    Hypertension    Lower extremity edema 09/21/2018   Obesity    Personal history of chemotherapy    Personal history of radiation therapy    Pneumonia    "couple times in the last 10-15 yrs" (08/16/2018)   QT prolongation 08/08/2014  Seasonal allergies 02/25/2017   Shortness of breath 10/05/2011   "at rest; lying down; w/exertion"   Sigmoid diverticulitis 80/2008   Tobacco abuse    Type 2 diabetes mellitus (HCC) 05/14/2009   Type II diabetes mellitus (HCC)    Past Surgical History:  Procedure Laterality Date   ABDOMINAL HYSTERECTOMY     ANTERIOR CERVICAL  DECOMP/DISCECTOMY FUSION  2012   "Dr. [name]  put plate in; did something to my vertebrae"   BACK SURGERY     BREAST LUMPECTOMY Right 1991   DOBUTAMINE STRESS ECHO  08/2004   Inferior ischemia, normal LV systolic function, no significant CAD   MINOR IRRIGATION AND DEBRIDEMENT OF WOUND Right 09/04/2022   Procedure: IRRIGATION AND DEBRIDEMENT OF NASAL WOUND;  Surgeon: Scarlette Ar, MD;  Location: [facility] OR;  Service: ENT;  Laterality: Right;   Social History:  reports that she has been smoking cigarettes. She has a 22.5 pack-year smoking history. She has never used smokeless tobacco. She reports current alcohol use of about 4.0 standard drinks of alcohol per week. She reports that she does not use drugs.  Allergies  Allergen Reactions   Ace Inhibitors Swelling    Throat swelling. Lisinopril-HCTZ Pt states she in not allergic     Family History  Problem Relation Age of Onset   Cancer Mother        unknown   Breast cancer Daughter 60   Breast cancer Daughter        59s   BRCA 1/2 Neg Hx     Prior to Admission medications   Medication Sig Start Date End Date Taking? Authorizing Provider  acetaminophen (TYLENOL) 325 MG tablet Take 2 tablets (650 mg total) by mouth every 6 (six) hours as needed for mild pain (or Fever >/= 101). 08/23/22   Marolyn Haller, MD  albuterol (VENTOLIN HFA) 108 (90 Base) MCG/ACT inhaler Inhale 2 puffs into the lungs every 6 (six) hours as needed for wheezing or shortness of breath. 01/10/24   Rocky Morel, DO  amLODipine (NORVASC) 10 MG tablet Take 1 tablet (10 mg total) by mouth daily. 12/13/22 12/13/23  [name], MD  azelastine (ASTELIN) 0.1 % nasal spray Place 2 sprays into both nostrils 2 (two) times daily. Use in each nostril as directed 03/04/23   [name], MD  DULoxetine (CYMBALTA) 60 MG capsule Take 1 capsule (60 mg total) by mouth daily. 01/10/23 01/10/24  Willette Cluster, MD  fexofenadine [facility] ALLERGY) 60 MG tablet Take 1 tablet (60  mg total) by mouth 2 (two) times daily. 03/03/23   [name], MD  fluticasone (FLONASE) 50 MCG/ACT nasal spray Place 1 spray into both nostrils daily. 07/14/23 07/13/24  Nooruddin, Jason Fila, MD  Fluticasone-Umeclidin-Vilant (TRELEGY ELLIPTA) 100-62.5-25 MCG/ACT AEPB Inhale 1 puff into the lungs daily. 01/10/24   Rocky Morel, DO  ipratropium-albuterol (DUONEB) 0.5-2.5 (3) MG/3ML SOLN Take 3 mLs by nebulization every 6 (six) hours as needed. 12/03/23   Curatolo, Adam, DO  loratadine (CLARITIN) 10 MG tablet Take 1 tablet (10 mg total) by mouth daily. 11/23/22   Rana Snare, DO  losartan-hydrochlorothiazide (HYZAAR) 50-12.5 MG tablet Take 1 tablet by mouth 2 (two) times daily. 03/30/23   [provider]  metoprolol tartrate (LOPRESSOR) 25 MG tablet Take 1 tablet (25 mg total) by mouth 2 (two) times daily. 12/13/22 12/13/23  [name], MD  simvastatin (ZOCOR) 40 MG tablet Take 1 tablet (40 mg total) by mouth every evening. 12/13/22 01/12/23  [name], MD    Physical  Exam: Vitals:   01/12/24 0804 01/12/24 0830 01/12/24 0900 01/12/24 0930  BP:    (!) 163/89  Pulse:   (!) 110 (!) 113  Resp:    (!) 23  Temp:      TempSrc:      SpO2: 98% 99% 100% 99%  Weight:      Height:       Physical Exam Vitals reviewed.  Constitutional:      General: She is awake. She is not in acute distress.    Appearance: She is normal weight. She is ill-appearing.     Interventions: Nasal cannula in place.  HENT:     Head: Normocephalic.     Nose: No rhinorrhea.  Eyes:     General: No scleral icterus.    Pupils: Pupils are equal, round, and reactive to light.  Neck:     Vascular: No JVD.  Cardiovascular:     Rate and Rhythm: Regular rhythm. Tachycardia present.     Heart sounds: S1 normal and S2 normal.  Pulmonary:     Effort: Tachypnea present. No accessory muscle usage or respiratory distress.     Breath sounds: Decreased breath sounds, wheezing and rhonchi present. No rales.   Abdominal:     General: Bowel sounds are normal. There is no distension.     Palpations: Abdomen is soft.     Tenderness: There is no abdominal tenderness.  Musculoskeletal:     Cervical back: Neck supple.     Right lower leg: No edema.     Left lower leg: No edema.  Skin:    General: Skin is warm and dry.  Neurological:     General: No focal deficit present.     Mental Status: She is alert and oriented to person, place, and time.  Psychiatric:        Mood and Affect: Mood normal.        Behavior: Behavior normal. Behavior is cooperative.     Data Reviewed:  Results are pending, will review when available. 09/14/2014 echocardiogram report. -----------------------  Study Conclusions   - Left ventricle: The cavity size was normal. There was moderate    concentric hypertrophy. Systolic function was normal. The    estimated ejection fraction was in the range of 60% to 65%.    Regional wall motion abnormalities cannot be excluded. Doppler    parameters are consistent with abnormal left ventricular    relaxation (grade 1 diastolic dysfunction). There was no evidence    of elevated ventricular filling pressure by Doppler parameters.  - Aortic valve: Trileaflet; normal thickness leaflets.  - Aortic root: The aortic root was normal in size.  - Mitral valve: Structurally normal valve. There was no    regurgitation.  - Right ventricle: Systolic function was normal.  - Right atrium: The atrium was normal in size.  - Pulmonic valve: There was no regurgitation.  - Pulmonary arteries: Systolic pressure was within the normal    range.  - Inferior vena cava: The vessel was normal in size.  - Pericardium, extracardiac: There was no pericardial effusion.   Impressions:   - Normal biventricular size and systolic function. Abnormal    relaxation with normal filling pressures. No significant valvular    abnormality.   EKG: Vent. rate 115 BPM PR interval 145 ms QRS duration 90  ms QT/QTcB 349/483 ms P-R-T axes 82 70 61 Sinus tachycardia Ventricular premature complex Biatrial enlargement Anterior infarct, old ST depr, consider ischemia, inferior leads  Assessment  and Plan: Principal Problem:   Acute respiratory insufficiency In the setting of:   Acute exacerbation of chronic obstructive pulmonary disease (COPD) (HCC) Observation/telemetry Continue supplemental oxygen. Methylprednisolone 125 mg IVP x1 given earlier. This will be followed by prednisone 40 mg p.o. daily in a.m. Magnesium sulfate 2 g IVPB x 1. Scheduled and as needed bronchodilators. If no improvement, may use BiPAP ventilation as needed. Follow-up CBC and chemistry in the morning.   Active Problems:   Tobacco abuse Tobacco cessation advised. Nicotine replacement therapy offered. -Declined nicotine patch. -Nicorette gum ordered as needed.    Hypertension, benign essential, goal below 140/90 Continue metoprolol 25 mg p.o. twice daily. Decrease losartan to 50-12.5 mg p.o. daily. Monitor blood pressure closely. May need to restart amlodipine.    Grade I diastolic dysfunction Does not seem to be overloaded. Resume beta-blocker, ARB and diuretic as above.    Hypokalemia Replacing. Magnesium supplemented.    Dyslipidemia associated with type 2 diabetes mellitus (HCC) She stated she is still taking simvastatin. Continue simvastatin 40 mg p.o. daily.    Type 2 diabetes mellitus with complication,  without long-term current use of insulin (HCC) Carbohydrate modified diet. CBG monitoring with RI SS. Check hemoglobin A1c.    Diabetic neuropathy (HCC) Analgesics as needed. Has not been taking duloxetine.      Advance Care Planning:   Code Status: Full Code   Consults:   Family Communication:   Severity of Illness: The appropriate patient status for this patient is OBSERVATION. Observation status is judged to be reasonable and necessary in order to provide the required  intensity of service to ensure the patient's safety. The patient's presenting symptoms, physical exam findings, and initial radiographic and laboratory data in the context of their medical condition is felt to place them at decreased risk for further clinical deterioration. Furthermore, it is anticipated that the patient will be medically stable for discharge from the hospital within 2 midnights of admission.   Author: Bobette Mo, MD 01/12/2024 10:15 AM  For on call review [email or web address].   This document was prepared using Dragon voice recognition software and may contain some unintended transcription errors.

## 2024-01-12 NOTE — ED Triage Notes (Signed)
 Pt arrived reporting copd exacerbation that started this morning. Denies recent illness. Has not used prescribed rescue inhaler. Patient unable to complete full sentences. Denies O2 use at home. 97-99% on room air

## 2024-01-12 NOTE — ED Notes (Addendum)
 Pt was on 98% on room air. Pt requested air for comfort. Now on 2 L

## 2024-01-13 DIAGNOSIS — F17210 Nicotine dependence, cigarettes, uncomplicated: Secondary | ICD-10-CM | POA: Diagnosis not present

## 2024-01-13 DIAGNOSIS — J441 Chronic obstructive pulmonary disease with (acute) exacerbation: Secondary | ICD-10-CM | POA: Diagnosis not present

## 2024-01-13 LAB — CBC
HCT: 40.1 % (ref 36.0–46.0)
Hemoglobin: 13.5 g/dL (ref 12.0–15.0)
MCH: 30.2 pg (ref 26.0–34.0)
MCHC: 33.7 g/dL (ref 30.0–36.0)
MCV: 89.7 fL (ref 80.0–100.0)
Platelets: 311 10*3/uL (ref 150–400)
RBC: 4.47 MIL/uL (ref 3.87–5.11)
RDW: 14 % (ref 11.5–15.5)
WBC: 5 10*3/uL (ref 4.0–10.5)
nRBC: 0 % (ref 0.0–0.2)

## 2024-01-13 LAB — COMPREHENSIVE METABOLIC PANEL
ALT: 11 U/L (ref 0–44)
AST: 26 U/L (ref 15–41)
Albumin: 4 g/dL (ref 3.5–5.0)
Alkaline Phosphatase: 93 U/L (ref 38–126)
Anion gap: 12 (ref 5–15)
BUN: 15 mg/dL (ref 8–23)
CO2: 24 mmol/L (ref 22–32)
Calcium: 9.3 mg/dL (ref 8.9–10.3)
Chloride: 97 mmol/L — ABNORMAL LOW (ref 98–111)
Creatinine, Ser: 0.8 mg/dL (ref 0.44–1.00)
GFR, Estimated: >60 mL/min (ref >60)
Glucose, Bld: 108 mg/dL — ABNORMAL HIGH (ref 70–99)
Potassium: 4 mmol/L (ref 3.5–5.1)
Sodium: 133 mmol/L — ABNORMAL LOW (ref 135–145)
Total Bilirubin: 0.9 mg/dL (ref 0.0–1.2)
Total Protein: 7.6 g/dL (ref 6.5–8.1)

## 2024-01-13 LAB — CBG MONITORING, ED: Glucose-Capillary: 114 mg/dL — ABNORMAL HIGH (ref 70–99)

## 2024-01-13 MED ORDER — DULOXETINE HCL 30 MG PO CPEP
60.0000 mg | ORAL_CAPSULE | Freq: Every day | ORAL | Status: DC
  Administered 2024-01-13: 60 mg via ORAL
  Filled 2024-01-13: qty 2

## 2024-01-13 MED ORDER — ALBUTEROL SULFATE (2.5 MG/3ML) 0.083% IN NEBU
2.5000 mg | INHALATION_SOLUTION | RESPIRATORY_TRACT | Status: DC | PRN
  Administered 2024-01-13 (×3): 2.5 mg via RESPIRATORY_TRACT
  Filled 2024-01-13 (×3): qty 3

## 2024-01-13 MED ORDER — ALBUTEROL SULFATE (2.5 MG/3ML) 0.083% IN NEBU: 2.5000 mg | INHALATION_SOLUTION | RESPIRATORY_TRACT | Status: DC | PRN

## 2024-01-13 MED ORDER — PREDNISONE 50 MG PO TABS: 50.0000 mg | ORAL_TABLET | Freq: Every day | ORAL | 0 refills | Status: AC

## 2024-01-13 MED ORDER — AMLODIPINE BESYLATE 5 MG PO TABS
10.0000 mg | ORAL_TABLET | Freq: Every day | ORAL | Status: DC
  Administered 2024-01-13: 10 mg via ORAL
  Filled 2024-01-13: qty 2

## 2024-01-13 MED ORDER — DOXYCYCLINE HYCLATE 100 MG PO TABS: 100.0000 mg | ORAL_TABLET | Freq: Two times a day (BID) | ORAL | 0 refills | Status: AC

## 2024-01-13 NOTE — Discharge Summary (Addendum)
 Physician Discharge Summary  [name] ZOX:[id number] DOB: 1951-07-18 DOA: 01/12/2024  PCP: [name], MD  Admit date: 01/12/2024 Discharge date: 01/13/2024    Admitted From: Home Disposition: Home  Recommendations for Outpatient Follow-up:  Follow up with PCP in 1-2 weeks Please obtain BMP/CBC in one week Please follow up with your PCP on the following pending results: Unresulted Labs (From admission, onward)     Start     Ordered   01/12/24 1152  Hemoglobin A1c  Add-on,   AD       Comments: To assess prior glycemic control    01/12/24 1151              Home Health: None Equipment/Devices: None  Discharge Condition: Stable CODE STATUS: Full code Diet recommendation: Cardiac  Due to brief hospitalization and better understanding, I have copied admitting hospitalist HPI, ED course as below. HPI: [name] is a 73 y.o. female with medical history significant of grade 1 diastolic dysfunction, sinusitis, anxiety, depression, arthritis, right breast cancer, history of lumpectomy, chemo and radiation therapy, facial cellulitis, facial edema, GERD, heart murmur, hyperlipidemia, hypertension, obesity, history of QT prolongation, diverticulosis, sigmoid diverticulitis, type 2 diabetes, active tobacco use, asthma, atopic dermatitis, COPD, active smoker who presented to the emergency department with complaints of progressively worse dyspnea associated with mostly nonproductive cough, fatigue and wheezing that failed to respond to her bronchodilators at home.  She denied fever, chills, rhinorrhea, sore throat or hemoptysis.  No chest pain, palpitations, diaphoresis, PND, orthopnea or pitting edema of the lower extremities.  No abdominal pain, nausea, emesis, diarrhea, constipation, melena or hematochezia.  No flank pain, dysuria, frequency or hematuria.  No polyuria, polydipsia, polyphagia or blurred vision.    Lab work: CBC, first troponin and BNP were normal.   BMP showed a potassium of 3.3 mmol/L and a calcium level of 8.8 mg/dL.   Imaging: Portable 1 view chest radiograph showing hyperinflated lungs, consistent with COPD.  Blunting of the right costophrenic angle, which may represent a small pleural effusion.   ED course: Initial vital signs were temperature 98.3 F, pulse 123, respiration 24, BP 203/110 mmHg O2 sat 96%.  The patient received supplemental oxygen, albuterol 10+ ipratropium 0.5 mg continuous neb x 2, methylprednisolone 125 mg IVP and KCl 40 mEq p.o. x 1.    Subjective: Seen and examined the ED, she feels much better.  Able to speak in full sentences.  Appears very comfortable with no wheezes.  She prefers to go home and that is reasonable as she appears stable.  Brief/Interim Summary: Patient was admitted under hospital service for acute COPD exacerbation.  She had mild respiratory insufficiency but no acute hypoxic respiratory failure.  She was placed on 2 L oxygen just for comfort but she was never hypoxic.  This morning she appears very comfortable, and able to speak in full sentences with no wheezes and she is not hypoxic and appears stable for discharge so I will discharge her on 5 more days of doxycycline and 4 days of prednisone.  Resume rest of home medications.  Please note that the medications which have been discontinued as shown below are the only medications which patient was not taking at home.  Patient's blood pressure was elevated earlier but now it is much better.  Resume all home medications. I have discussed tobacco cessation with the patient.  I have counseled the patient regarding the negative impacts of continued tobacco use including but not limited to lung cancer,  COPD, and cardiovascular disease.  I have discussed alternatives to tobacco and modalities that may help facilitate tobacco cessation including but not limited to biofeedback, hypnosis, and medications.  Total time spent with tobacco counseling was 5 minutes.   Patient was replaced and normal now.  Discharge Diagnoses:  Principal Problem:   Acute exacerbation of chronic obstructive pulmonary disease (COPD) (HCC) Active Problems:   Tobacco abuse   Hypertension, benign essential, goal below 140/90   Hypokalemia   Dyslipidemia associated with type 2 diabetes mellitus (HCC)   Type 2 diabetes mellitus with complication, without long-term current use of insulin (HCC)   Diabetic neuropathy (HCC)   Acute respiratory insufficiency   Grade I diastolic dysfunction    Discharge Instructions   Allergies as of 01/13/2024       Reactions   Ace Inhibitors Anaphylaxis, Swelling, Other (See Comments)   Throat swelling  (Re: Lisinopril-hydrochlorothiazide) Pt questioned if she is allergic in 2025.        Medication List     STOP taking these medications    azelastine 0.1 % nasal spray Commonly known as: ASTELIN   fexofenadine 60 MG tablet Commonly known as: Allegra Allergy   ipratropium-albuterol 0.5-2.5 (3) MG/3ML Soln Commonly known as: DUONEB   loratadine 10 MG tablet Commonly known as: Claritin   simvastatin 40 MG tablet Commonly known as: ZOCOR   Trelegy Ellipta 100-62.5-25 MCG/ACT Aepb Generic drug: Fluticasone-Umeclidin-Vilant       TAKE these medications    acetaminophen 325 MG tablet Commonly known as: TYLENOL Take 2 tablets (650 mg total) by mouth every 6 (six) hours as needed for mild pain (or Fever >/= 101).   albuterol (2.5 MG/3ML) 0.083% nebulizer solution Commonly known as: PROVENTIL Take 2.5 mg by nebulization every 4 (four) hours as needed for shortness of breath or wheezing (for flares, when sick).   albuterol 108 (90 Base) MCG/ACT inhaler Commonly known as: VENTOLIN HFA Inhale 2 puffs into the lungs every 6 (six) hours as needed for wheezing or shortness of breath.   amLODipine 10 MG tablet Commonly known as: NORVASC Take 1 tablet (10 mg total) by mouth daily.   cetirizine 5 MG tablet Commonly known  as: ZYRTEC Take 5 mg by mouth at bedtime as needed for allergies or rhinitis.   doxycycline 100 MG tablet Commonly known as: VIBRA-TABS Take 1 tablet (100 mg total) by mouth 2 (two) times daily for 5 days.   DULoxetine 60 MG capsule Commonly known as: CYMBALTA Take 1 capsule (60 mg total) by mouth daily.   fluticasone 50 MCG/ACT nasal spray Commonly known as: FLONASE Place 1 spray into both nostrils daily. What changed:  when to take this additional instructions   losartan-hydrochlorothiazide 50-12.5 MG tablet Commonly known as: HYZAAR Take 1 tablet by mouth 2 (two) times daily.   metoprolol tartrate 25 MG tablet Commonly known as: LOPRESSOR Take 1 tablet (25 mg total) by mouth 2 (two) times daily.   predniSONE 50 MG tablet Commonly known as: DELTASONE Take 1 tablet (50 mg total) by mouth daily with breakfast for 4 days.   Spiriva HandiHaler 18 MCG inhalation capsule Generic drug: tiotropium Place 18 mcg into inhaler and inhale daily as needed (for flares- when sick).        Follow-up Information     [name], MD Follow up in 1 week(s).   Specialty: Internal Medicine Contact information: [address] [place] [address] [phone]  Allergies  Allergen Reactions   Ace Inhibitors Anaphylaxis, Swelling and Other (See Comments)    Throat swelling  (Re: Lisinopril-hydrochlorothiazide)  Pt questioned if she is allergic in 2025.    Consultations: None   Procedures/Studies: DG Chest Portable 1 View Result Date: 01/12/2024 CLINICAL DATA:  COPD exacerbation EXAM: PORTABLE CHEST 1 VIEW COMPARISON:  Chest radiograph dated 12/03/2023 FINDINGS: Hyperinflated lungs. No focal consolidations. Blunting of the right costophrenic angle. No pneumothorax. The heart size and mediastinal contours are within normal limits. Cervical spinal fixation hardware appears intact. Right axillary surgical clips. IMPRESSION: 1. Hyperinflated lungs,  consistent with COPD. 2. Blunting of the right costophrenic angle, which may represent a small pleural effusion. Electronically Signed   By: Agustin Cree M.D.   On: 01/12/2024 08:52     Discharge Exam: Vitals:   01/13/24 0945 01/13/24 1000  BP:  (!) 141/80  Pulse: 83 81  Resp: 19 15  Temp:    SpO2: 100% 95%   Vitals:   01/13/24 0900 01/13/24 0930 01/13/24 0945 01/13/24 1000  BP: (!) 217/96 (!) 168/140  (!) 141/80  Pulse: (!) 105  83 81  Resp: (!) 28  19 15   Temp:      TempSrc:      SpO2: 97%  100% 95%  Weight:      Height:        General: Pt is alert, awake, not in acute distress Cardiovascular: RRR, S1/S2 +, no rubs, no gallops Respiratory: CTA bilaterally, no wheezing, no rhonchi Abdominal: Soft, NT, ND, bowel sounds + Extremities: no edema, no cyanosis    The results of significant diagnostics from this hospitalization (including imaging, microbiology, ancillary and laboratory) are listed below for reference.     Microbiology: No results found for this or any previous visit (from the past 240 hours).   Labs: BNP (last 3 results) Recent Labs    01/12/24 0830  BNP 68.9   Basic Metabolic Panel: Recent Labs  Lab 01/12/24 0812 01/12/24 1029 01/13/24 0449  NA 136  --  133*  K 3.3*  --  4.0  CL 103  --  97*  CO2 25  --  24  GLUCOSE 85  --  108*  BUN 9  --  15  CREATININE 0.64  --  0.80  CALCIUM 8.8*  --  9.3  MG  --  1.8  --   PHOS  --  3.6  --    Liver Function Tests: Recent Labs  Lab 01/12/24 1029 01/13/24 0449  AST 19 26  ALT 11 11  ALKPHOS 96 93  BILITOT 1.0 0.9  PROT 7.0 7.6  ALBUMIN 3.7 4.0   No results for input(s): "LIPASE", "AMYLASE" in the last 168 hours. No results for input(s): "AMMONIA" in the last 168 hours. CBC: Recent Labs  Lab 01/12/24 0812 01/13/24 0449  WBC 4.2 5.0  HGB 13.1 13.5  HCT 40.5 40.1  MCV 92.0 89.7  PLT 286 311   Cardiac Enzymes: No results for input(s): "CKTOTAL", "CKMB", "CKMBINDEX", "TROPONINI" in the  last 168 hours. BNP: Invalid input(s): "POCBNP" CBG: Recent Labs  Lab 01/12/24 1217 01/12/24 1713 01/12/24 2218 01/13/24 0744  GLUCAP 201* 112* 148* 114*   D-Dimer No results for input(s): "DDIMER" in the last 72 hours. Hgb A1c No results for input(s): "HGBA1C" in the last 72 hours. Lipid Profile No results for input(s): "CHOL", "HDL", "LDLCALC", "TRIG", "CHOLHDL", "LDLDIRECT" in the last 72 hours. Thyroid function studies No results for input(s): "TSH", "T4TOTAL", "  T3FREE", "THYROIDAB" in the last 72 hours.  Invalid input(s): "FREET3" Anemia work up No results for input(s): "VITAMINB12", "FOLATE", "FERRITIN", "TIBC", "IRON", "RETICCTPCT" in the last 72 hours. Urinalysis    Component Value Date/Time   COLORURINE STRAW (A) 04/06/2019 1804   APPEARANCEUR CLEAR 04/06/2019 1804   LABSPEC 1.023 04/06/2019 1804   PHURINE 5.0 04/06/2019 1804   GLUCOSEU >=500 (A) 04/06/2019 1804   HGBUR NEGATIVE 04/06/2019 1804   BILIRUBINUR NEGATIVE 04/06/2019 1804   KETONESUR 5 (A) 04/06/2019 1804   PROTEINUR NEGATIVE 04/06/2019 1804   UROBILINOGEN 1.0 02/15/2012 1208   NITRITE NEGATIVE 04/06/2019 1804   LEUKOCYTESUR NEGATIVE 04/06/2019 1804   Sepsis Labs Recent Labs  Lab 01/12/24 0812 01/13/24 0449  WBC 4.2 5.0   Microbiology No results found for this or any previous visit (from the past 240 hours).  FURTHER DISCHARGE INSTRUCTIONS:   Get Medicines reviewed and adjusted: Please take all your medications with you for your next visit with your Primary MD   Laboratory/radiological data: Please request your Primary MD to go over all hospital tests and procedure/radiological results at the follow up, please ask your Primary MD to get all Hospital records sent to his/her office.   In some cases, they will be blood work, cultures and biopsy results pending at the time of your discharge. Please request that your primary care M.D. goes through all the records of your hospital data and  follows up on these results.   Also Note the following: If you experience worsening of your admission symptoms, develop shortness of breath, life threatening emergency, suicidal or homicidal thoughts you must seek medical attention immediately by calling 911 or calling your MD immediately  if symptoms less severe.   You must read complete instructions/literature along with all the possible adverse reactions/side effects for all the Medicines you take and that have been prescribed to you. Take any new Medicines after you have completely understood and accpet all the possible adverse reactions/side effects.    Do not drive when taking Pain medications or sleeping medications (Benzodaizepines)   Do not take more than prescribed Pain, Sleep and Anxiety Medications. It is not advisable to combine anxiety,sleep and pain medications without talking with your primary care practitioner   Special Instructions: If you have smoked or chewed Tobacco  in the last 2 yrs please stop smoking, stop any regular Alcohol  and or any Recreational drug use.   Wear Seat belts while driving.   Please note: You were cared for by a hospitalist during your hospital stay. Once you are discharged, your primary care physician will handle any further medical issues. Please note that NO REFILLS for any discharge medications will be authorized once you are discharged, as it is imperative that you return to your primary care physician (or establish a relationship with a primary care physician if you do not have one) for your post hospital discharge needs so that they can reassess your need for medications and monitor your lab values  Time coordinating discharge: Over 30 minutes  SIGNED:   Hughie Closs, MD  [facility] 01/13/2024, 10:34 AM *Please note that this is a verbal dictation therefore any spelling or grammatical errors are due to the "[employer] One" system interpretation. If 7PM-7AM, please contact  night-coverage [email or web address]

## 2024-01-13 NOTE — Care Management Obs Status (Signed)
 MEDICARE OBSERVATION STATUS NOTIFICATION   Patient Details  Name: BRADEE COMMON MRN: [id number] Date of Birth: 05-16-1951   Medicare Observation Status Notification Given:  Yes    Princella Ion, LCSW 01/13/2024, 11:11 AM

## 2024-02-16 ENCOUNTER — Encounter: Admitting: Student

## 2024-02-17 ENCOUNTER — Emergency Department (HOSPITAL_COMMUNITY)

## 2024-02-17 ENCOUNTER — Inpatient Hospital Stay (HOSPITAL_COMMUNITY)
Admission: EM | Admit: 2024-02-17 | Discharge: 2024-02-22 | DRG: 190 | Disposition: A | Discharge status: Home / Self Care | Attending: Internal Medicine | Admitting: Internal Medicine

## 2024-02-17 ENCOUNTER — Other Ambulatory Visit: Payer: Self-pay

## 2024-02-17 ENCOUNTER — Encounter (HOSPITAL_COMMUNITY): Payer: Self-pay | Admitting: Internal Medicine

## 2024-02-17 DIAGNOSIS — J441 Chronic obstructive pulmonary disease with (acute) exacerbation: Principal | ICD-10-CM | POA: Diagnosis present

## 2024-02-17 DIAGNOSIS — Z923 Personal history of irradiation: Secondary | ICD-10-CM

## 2024-02-17 DIAGNOSIS — J9601 Acute respiratory failure with hypoxia: Secondary | ICD-10-CM | POA: Diagnosis present

## 2024-02-17 DIAGNOSIS — E785 Hyperlipidemia, unspecified: Secondary | ICD-10-CM | POA: Diagnosis present

## 2024-02-17 DIAGNOSIS — Z9221 Personal history of antineoplastic chemotherapy: Secondary | ICD-10-CM

## 2024-02-17 DIAGNOSIS — M170 Bilateral primary osteoarthritis of knee: Secondary | ICD-10-CM | POA: Diagnosis present

## 2024-02-17 DIAGNOSIS — Z803 Family history of malignant neoplasm of breast: Secondary | ICD-10-CM | POA: Diagnosis not present

## 2024-02-17 DIAGNOSIS — E860 Dehydration: Secondary | ICD-10-CM | POA: Diagnosis present

## 2024-02-17 DIAGNOSIS — I10 Essential (primary) hypertension: Secondary | ICD-10-CM | POA: Diagnosis present

## 2024-02-17 DIAGNOSIS — F17210 Nicotine dependence, cigarettes, uncomplicated: Secondary | ICD-10-CM | POA: Diagnosis present

## 2024-02-17 DIAGNOSIS — E872 Acidosis, unspecified: Secondary | ICD-10-CM | POA: Diagnosis present

## 2024-02-17 DIAGNOSIS — Z79899 Other long term (current) drug therapy: Secondary | ICD-10-CM

## 2024-02-17 DIAGNOSIS — E119 Type 2 diabetes mellitus without complications: Secondary | ICD-10-CM | POA: Diagnosis present

## 2024-02-17 DIAGNOSIS — Z716 Tobacco abuse counseling: Secondary | ICD-10-CM | POA: Diagnosis not present

## 2024-02-17 DIAGNOSIS — Z1152 Encounter for screening for COVID-19: Secondary | ICD-10-CM

## 2024-02-17 DIAGNOSIS — M19042 Primary osteoarthritis, left hand: Secondary | ICD-10-CM | POA: Diagnosis present

## 2024-02-17 DIAGNOSIS — Z981 Arthrodesis status: Secondary | ICD-10-CM | POA: Diagnosis not present

## 2024-02-17 DIAGNOSIS — Z853 Personal history of malignant neoplasm of breast: Secondary | ICD-10-CM

## 2024-02-17 DIAGNOSIS — E871 Hypo-osmolality and hyponatremia: Secondary | ICD-10-CM | POA: Diagnosis present

## 2024-02-17 DIAGNOSIS — Z720 Tobacco use: Secondary | ICD-10-CM

## 2024-02-17 DIAGNOSIS — J019 Acute sinusitis, unspecified: Secondary | ICD-10-CM

## 2024-02-17 DIAGNOSIS — Z90710 Acquired absence of both cervix and uterus: Secondary | ICD-10-CM | POA: Diagnosis not present

## 2024-02-17 DIAGNOSIS — Z91148 Patient's other noncompliance with medication regimen for other reason: Secondary | ICD-10-CM

## 2024-02-17 DIAGNOSIS — F32A Depression, unspecified: Secondary | ICD-10-CM | POA: Diagnosis present

## 2024-02-17 DIAGNOSIS — Z888 Allergy status to other drugs, medicaments and biological substances status: Secondary | ICD-10-CM | POA: Diagnosis not present

## 2024-02-17 DIAGNOSIS — R0602 Shortness of breath: Secondary | ICD-10-CM | POA: Diagnosis not present

## 2024-02-17 DIAGNOSIS — E876 Hypokalemia: Secondary | ICD-10-CM | POA: Diagnosis not present

## 2024-02-17 DIAGNOSIS — M19041 Primary osteoarthritis, right hand: Secondary | ICD-10-CM | POA: Diagnosis present

## 2024-02-17 DIAGNOSIS — E1122 Type 2 diabetes mellitus with diabetic chronic kidney disease: Secondary | ICD-10-CM | POA: Diagnosis present

## 2024-02-17 DIAGNOSIS — R059 Cough, unspecified: Secondary | ICD-10-CM | POA: Diagnosis not present

## 2024-02-17 LAB — COMPREHENSIVE METABOLIC PANEL WITH GFR
ALT: 9 U/L (ref 0–44)
AST: 20 U/L (ref 15–41)
Albumin: 3.9 g/dL (ref 3.5–5.0)
Alkaline Phosphatase: 100 U/L (ref 38–126)
Anion gap: 11 (ref 5–15)
BUN: 9 mg/dL (ref 8–23)
CO2: 23 mmol/L (ref 22–32)
Calcium: 8.7 mg/dL — ABNORMAL LOW (ref 8.9–10.3)
Chloride: 99 mmol/L (ref 98–111)
Creatinine, Ser: 0.81 mg/dL (ref 0.44–1.00)
GFR, Estimated: >60 mL/min (ref >60)
Glucose, Bld: 86 mg/dL (ref 70–99)
Potassium: 3.5 mmol/L (ref 3.5–5.1)
Sodium: 133 mmol/L — ABNORMAL LOW (ref 135–145)
Total Bilirubin: 0.9 mg/dL (ref 0.0–1.2)
Total Protein: 7.3 g/dL (ref 6.5–8.1)

## 2024-02-17 LAB — CBC
HCT: 41.8 % (ref 36.0–46.0)
Hemoglobin: 13.9 g/dL (ref 12.0–15.0)
MCH: 30.4 pg (ref 26.0–34.0)
MCHC: 33.3 g/dL (ref 30.0–36.0)
MCV: 91.5 fL (ref 80.0–100.0)
Platelets: 264 10*3/uL (ref 150–400)
RBC: 4.57 MIL/uL (ref 3.87–5.11)
RDW: 13.8 % (ref 11.5–15.5)
WBC: 4.4 10*3/uL (ref 4.0–10.5)
nRBC: 0 % (ref 0.0–0.2)

## 2024-02-17 LAB — I-STAT CG4 LACTIC ACID, ED: Lactic Acid, Venous: 1.3 mmol/L (ref 0.5–1.9)

## 2024-02-17 LAB — RESP PANEL BY RT-PCR (RSV, FLU A&B, COVID)  RVPGX2
Influenza A by PCR: NEGATIVE
Influenza B by PCR: NEGATIVE
Resp Syncytial Virus by PCR: NEGATIVE
SARS Coronavirus 2 by RT PCR: NEGATIVE

## 2024-02-17 LAB — BRAIN NATRIURETIC PEPTIDE: B Natriuretic Peptide: 59.5 pg/mL (ref 0.0–100.0)

## 2024-02-17 LAB — TROPONIN I (HIGH SENSITIVITY)
Troponin I (High Sensitivity): 11 ng/L (ref <18)
Troponin I (High Sensitivity): 9 ng/L (ref <18)

## 2024-02-17 LAB — CG4 I-STAT (LACTIC ACID): Lactic Acid, Venous: 2.6 mmol/L (ref 0.5–1.9)

## 2024-02-17 MED ORDER — AMLODIPINE BESYLATE 5 MG PO TABS: 10.0000 mg | ORAL_TABLET | Freq: Every day | ORAL | Status: DC

## 2024-02-17 MED ORDER — ALBUTEROL SULFATE (2.5 MG/3ML) 0.083% IN NEBU
5.0000 mg | INHALATION_SOLUTION | Freq: Once | RESPIRATORY_TRACT | Status: AC
  Administered 2024-02-17: 5 mg via RESPIRATORY_TRACT
  Filled 2024-02-17: qty 6

## 2024-02-17 MED ORDER — PANTOPRAZOLE SODIUM 20 MG PO TBEC
20.0000 mg | DELAYED_RELEASE_TABLET | Freq: Every day | ORAL | Status: DC
  Administered 2024-02-17 – 2024-02-22 (×6): 20 mg via ORAL
  Filled 2024-02-17 (×6): qty 1

## 2024-02-17 MED ORDER — POLYETHYLENE GLYCOL 3350 17 G PO PACK: 17.0000 g | PACK | Freq: Every day | ORAL | Status: DC | PRN

## 2024-02-17 MED ORDER — SODIUM CHLORIDE 0.9% FLUSH
3.0000 mL | Freq: Two times a day (BID) | INTRAVENOUS | Status: DC
  Administered 2024-02-17 – 2024-02-21 (×8): 3 mL via INTRAVENOUS

## 2024-02-17 MED ORDER — MONTELUKAST SODIUM 5 MG PO CHEW
5.0000 mg | CHEWABLE_TABLET | Freq: Every day | ORAL | Status: DC
  Administered 2024-02-17 – 2024-02-21 (×5): 5 mg via ORAL
  Filled 2024-02-17 (×6): qty 1

## 2024-02-17 MED ORDER — LOSARTAN POTASSIUM 50 MG PO TABS
50.0000 mg | ORAL_TABLET | Freq: Every day | ORAL | Status: DC
  Administered 2024-02-17 – 2024-02-22 (×6): 50 mg via ORAL
  Filled 2024-02-17 (×6): qty 1

## 2024-02-17 MED ORDER — METHYLPREDNISOLONE SODIUM SUCC 40 MG IJ SOLR
40.0000 mg | Freq: Two times a day (BID) | INTRAMUSCULAR | Status: AC
  Administered 2024-02-18 – 2024-02-20 (×5): 40 mg via INTRAVENOUS
  Filled 2024-02-17 (×5): qty 1

## 2024-02-17 MED ORDER — ACETAMINOPHEN 325 MG PO TABS
650.0000 mg | ORAL_TABLET | Freq: Four times a day (QID) | ORAL | Status: DC | PRN
  Administered 2024-02-18: 650 mg via ORAL
  Filled 2024-02-17: qty 2

## 2024-02-17 MED ORDER — ALBUTEROL SULFATE HFA 108 (90 BASE) MCG/ACT IN AERS
2.0000 | INHALATION_SPRAY | RESPIRATORY_TRACT | Status: DC | PRN
Start: 2024-02-17 — End: 2024-02-17

## 2024-02-17 MED ORDER — MAGNESIUM SULFATE 2 GM/50ML IV SOLN
2.0000 g | Freq: Once | INTRAVENOUS | Status: AC
  Administered 2024-02-17: 2 g via INTRAVENOUS
  Filled 2024-02-17: qty 50

## 2024-02-17 MED ORDER — LOSARTAN POTASSIUM-HCTZ 50-12.5 MG PO TABS
1.0000 | ORAL_TABLET | Freq: Two times a day (BID) | ORAL | Status: DC
Start: 2024-02-17 — End: 2024-02-17

## 2024-02-17 MED ORDER — LABETALOL HCL 5 MG/ML IV SOLN
20.0000 mg | INTRAVENOUS | Status: DC | PRN
  Administered 2024-02-17 – 2024-02-19 (×4): 20 mg via INTRAVENOUS
  Filled 2024-02-17 (×5): qty 4

## 2024-02-17 MED ORDER — HYDROCHLOROTHIAZIDE 12.5 MG PO TABS
12.5000 mg | ORAL_TABLET | Freq: Every day | ORAL | Status: DC
  Administered 2024-02-17 – 2024-02-22 (×6): 12.5 mg via ORAL
  Filled 2024-02-17 (×6): qty 1

## 2024-02-17 MED ORDER — METHYLPREDNISOLONE SODIUM SUCC 125 MG IJ SOLR
125.0000 mg | Freq: Once | INTRAMUSCULAR | Status: AC
  Administered 2024-02-17: 125 mg via INTRAVENOUS
  Filled 2024-02-17: qty 2

## 2024-02-17 MED ORDER — IPRATROPIUM-ALBUTEROL 0.5-2.5 (3) MG/3ML IN SOLN
3.0000 mL | RESPIRATORY_TRACT | Status: DC
  Administered 2024-02-17 – 2024-02-22 (×28): 3 mL via RESPIRATORY_TRACT
  Filled 2024-02-17 (×29): qty 3

## 2024-02-17 MED ORDER — HYDRALAZINE HCL 20 MG/ML IJ SOLN
10.0000 mg | INTRAMUSCULAR | Status: DC | PRN
  Administered 2024-02-18 – 2024-02-19 (×2): 10 mg via INTRAVENOUS
  Filled 2024-02-17 (×2): qty 1
  Filled 2024-02-17: qty 0.5

## 2024-02-17 MED ORDER — MELATONIN 5 MG PO TABS
5.0000 mg | ORAL_TABLET | Freq: Once | ORAL | Status: AC
Start: 2024-02-17 — End: 2024-02-17
  Administered 2024-02-17: 5 mg via ORAL
  Filled 2024-02-17: qty 1

## 2024-02-17 MED ORDER — SODIUM CHLORIDE 0.9 % IV BOLUS
250.0000 mL | Freq: Once | INTRAVENOUS | Status: AC
  Administered 2024-02-17: 250 mL via INTRAVENOUS

## 2024-02-17 MED ORDER — ENOXAPARIN SODIUM 40 MG/0.4ML IJ SOSY
40.0000 mg | PREFILLED_SYRINGE | INTRAMUSCULAR | Status: DC
  Administered 2024-02-18 – 2024-02-22 (×5): 40 mg via SUBCUTANEOUS
  Filled 2024-02-17 (×5): qty 0.4

## 2024-02-17 MED ORDER — FLUTICASONE FUROATE-VILANTEROL 200-25 MCG/ACT IN AEPB
1.0000 | INHALATION_SPRAY | Freq: Every day | RESPIRATORY_TRACT | Status: DC
  Administered 2024-02-18 – 2024-02-22 (×5): 1 via RESPIRATORY_TRACT
  Filled 2024-02-17: qty 28

## 2024-02-17 MED ORDER — LABETALOL HCL 5 MG/ML IV SOLN
10.0000 mg | Freq: Once | INTRAVENOUS | Status: AC
  Administered 2024-02-17: 10 mg via INTRAVENOUS
  Filled 2024-02-17: qty 4

## 2024-02-17 MED ORDER — IPRATROPIUM-ALBUTEROL 0.5-2.5 (3) MG/3ML IN SOLN
3.0000 mL | Freq: Once | RESPIRATORY_TRACT | Status: AC
  Administered 2024-02-17: 3 mL via RESPIRATORY_TRACT
  Filled 2024-02-17: qty 3

## 2024-02-17 MED ORDER — ACETAMINOPHEN 500 MG PO TABS
1000.0000 mg | ORAL_TABLET | Freq: Once | ORAL | Status: AC
  Administered 2024-02-17: 1000 mg via ORAL
  Filled 2024-02-17: qty 2

## 2024-02-17 MED ORDER — ACETAMINOPHEN 650 MG RE SUPP: 650.0000 mg | Freq: Four times a day (QID) | RECTAL | Status: DC | PRN

## 2024-02-17 NOTE — Assessment & Plan Note (Signed)
 Other consideration include asthma exacerbation.  Patient symptomatically improved after treatment with methylprednisolone, magnesium as well as inhaled bronchodilator therapy in the ER.  I will continue with DuoNeb therapy every 4 hourly.  I will continue with methylprednisolone 40 mg twice daily.  And restart patient's inhaled corticosteroids and long-acting beta adrenergic agent.  Will also start the patient on montelukast. Pantop for GI ppx.  Snoking cessation discussed. Patient doe snot desire nicotine replacemtn

## 2024-02-17 NOTE — H&P (Signed)
 History and Physical    Patient: [name] SAY:[id number] DOB: [birth year] DOA: 02/17/2024 DOS: the patient was seen and examined on 02/17/2024 PCP: [name], MD  Patient coming from: Home  Chief Complaint:  Chief Complaint  Patient presents with   Shortness of Breath   Cough   HPI: [name] is a 73 y.o. female with medical history significant of copd and asthma. Patiet says that she ran out of her symbicort a "few days" ago. Fill history suggests aptient has not been taking her meds for long time.  Patinet was in her usual state till 48 hours ago, over that period of time patient has developed insidious onset of shortness of breath present at rest worse with exertion.  Associated mostly dry cough.  Patient is also having sneezing episodes.  There is no chest pain fever.  Patient denies any leg swelling.  Patient shortness of breath got worse today and in spite of using her rescue inhaler twice, patient did not get any relief in her sensation of shortness of breath.  Patient came to the ER.  Per report from the ER provider, patient was found to be tachypneic and subjective respiratory distress on evaluation.  S/p methylprednisolone magnesium as well as bronchodilators by inhalation.  Now clinically improved.  However patient still has sensation of shortness of breath.  Patient did not require supplemental oxygen during this hospitalization.  Patient is also noted to be hypertensive during evaluation. Review of Systems: As mentioned in the history of present illness. All other systems reviewed and are negative. Past Medical History:  Diagnosis Date   Acute sinusitis 01/09/2019   Anxiety    Arthritis    "fingers, knees" (08/16/2018)   Asthma    Atopic dermatitis 03/20/2018   Axillary hidradenitis suppurativa 10/13/2018   Cancer of right breast (HCC) 1991   s/p lumpectomy, chemotherapy and radiation therapy in 1991. Mammogram in 2007 was normal.    Cellulitis of buttock, left 12/13/2022   Constipated    h/o   COPD (chronic obstructive pulmonary disease) (HCC)    History of multiple hospital admissions for exercabation    COPD exacerbation (HCC) 01/01/2019   COPD with acute exacerbation (HCC) 01/14/2019   COPD with exacerbation (HCC) 04/06/2009   Qualifier: Diagnosis of  By: Comer Locket MD, Vijay     Depression    Diarrhea    h/o   Facial cellulitis 08/30/2022   Facial edema 08/31/2022   GERD (gastroesophageal reflux disease)    Grade I diastolic dysfunction 01/12/2024   Headache    "a few times/month" (08/16/2018   Heart murmur 10/05/2011   "first time I ever heard I had one was today"   History of breast cancer 12/01/2012   Pt with h/o breast CA s/p lumpectomy with chemo/radiation in 1991. Pt mammogram 2011 was unremarkable. Had CT chest 10/2012 in ED for SOB and showed spiculated nodule with lymph node. 12/04/12: Birads 2; repeat diagnostic mammogram in 1 year.     Hyperlipidemia    Hypertension    Lower extremity edema 09/21/2018   Obesity    Personal history of chemotherapy    Personal history of radiation therapy    Pneumonia    "couple times in the last 10-15 yrs" (08/16/2018)   QT prolongation 08/08/2014   Seasonal allergies 02/25/2017   Shortness of breath 10/05/2011   "at rest; lying down; w/exertion"   Sigmoid diverticulitis 80/2008   Tobacco abuse    Type 2 diabetes mellitus (HCC) 05/14/2009  Type II diabetes mellitus (HCC)    Past Surgical History:  Procedure Laterality Date   ABDOMINAL HYSTERECTOMY     ANTERIOR CERVICAL DECOMP/DISCECTOMY FUSION  2012   "Dr. [name]  put plate in; did something to my vertebrae"   BACK SURGERY     BREAST LUMPECTOMY Right 1991   DOBUTAMINE STRESS ECHO  08/2004   Inferior ischemia, normal LV systolic function, no significant CAD   MINOR IRRIGATION AND DEBRIDEMENT OF WOUND Right 09/04/2022   Procedure: IRRIGATION AND DEBRIDEMENT OF NASAL WOUND;  Surgeon: Scarlette Ar, MD;   Location: [facility] OR;  Service: ENT;  Laterality: Right;   Social History:  reports that she has been smoking cigarettes. She has a 22.5 pack-year smoking history. She has never used smokeless tobacco. She reports current alcohol use of about 4.0 standard drinks of alcohol per week. She reports that she does not use drugs.  Allergies  Allergen Reactions   Ace Inhibitors Anaphylaxis, Swelling and Other (See Comments)    Throat swelling  (Re: Lisinopril-hydrochlorothiazide)  Pt questioned if she is allergic in 2025.    Family History  Problem Relation Age of Onset   Cancer Mother        unknown   Breast cancer Daughter 22   Breast cancer Daughter        67s   BRCA 1/2 Neg Hx     Prior to Admission medications   Medication Sig Start Date End Date Taking? Authorizing Provider  acetaminophen (TYLENOL) 325 MG tablet Take 2 tablets (650 mg total) by mouth every 6 (six) hours as needed for mild pain (or Fever >/= 101). 08/23/22   Marolyn Haller, MD  albuterol (PROVENTIL) (2.5 MG/3ML) 0.083% nebulizer solution Take 2.5 mg by nebulization every 4 (four) hours as needed for shortness of breath or wheezing (for flares, when sick). 12/05/23   [provider]  albuterol (VENTOLIN HFA) 108 (90 Base) MCG/ACT inhaler Inhale 2 puffs into the lungs every 6 (six) hours as needed for wheezing or shortness of breath. 01/10/24   Rocky Morel, DO  amLODipine (NORVASC) 10 MG tablet Take 1 tablet (10 mg total) by mouth daily. 12/13/22 01/12/24  [name], MD  cetirizine (ZYRTEC) 5 MG tablet Take 5 mg by mouth at bedtime as needed for allergies or rhinitis.    [provider]  DULoxetine (CYMBALTA) 60 MG capsule Take 1 capsule (60 mg total) by mouth daily. 01/10/23 01/12/24  Willette Cluster, MD  fluticasone (FLONASE) 50 MCG/ACT nasal spray Place 1 spray into both nostrils daily. Patient taking differently: Place 1 spray into both nostrils See admin instructions. Instill 1 spray into each  nostril one to two times a day as needed for rhinitis or allergies 07/14/23 07/13/24  Nooruddin, Jason Fila, MD  losartan-hydrochlorothiazide (HYZAAR) 50-12.5 MG tablet Take 1 tablet by mouth 2 (two) times daily. 03/30/23   [provider]  metoprolol tartrate (LOPRESSOR) 25 MG tablet Take 1 tablet (25 mg total) by mouth 2 (two) times daily. 12/13/22 01/12/24  [name], MD  tiotropium (SPIRIVA HANDIHALER) 18 MCG inhalation capsule Place 18 mcg into inhaler and inhale daily as needed (for flares- when sick).    [provider]    Physical Exam: Vitals:   02/17/24 1800 02/17/24 1830 02/17/24 1900 02/17/24 1931  BP: (!) 177/60 (!) 183/91  (!) 186/118  Pulse: (!) 102 (!) 102  90  Resp:  (!) 22    Temp:   98 F (36.7 C)   TempSrc:   Oral  SpO2: 94% 96%  99%  Weight:      Height:       General: Patient is on room air, accompanied by her brother.  Patient does not appear to be distressed. Respiratory exam: Bilateral air entry is vesicular and good excursion, however diffuse scant wheezes are heard with prolonged expiration. Cardiovascular exam S1-S2 normal Abdomen all quadrants are soft nontender Extremities warm without edema no focal motor deficit. Data Reviewed:  Labs on Admission:  Results for orders placed or performed during the hospital encounter of 02/17/24 (from the past 24 hours)  CBC     Status: None   Collection Time: 02/17/24  4:33 PM  Result Value Ref Range   WBC 4.4 4.0 - 10.5 K/uL   RBC 4.57 3.87 - 5.11 MIL/uL   Hemoglobin 13.9 12.0 - 15.0 g/dL   HCT 96.0 45.4 - 09.8 %   MCV 91.5 80.0 - 100.0 fL   MCH 30.4 26.0 - 34.0 pg   MCHC 33.3 30.0 - 36.0 g/dL   RDW 11.9 14.7 - 82.9 %   Platelets 264 150 - 400 K/uL   nRBC 0.0 0.0 - 0.2 %  Comprehensive metabolic panel     Status: Abnormal   Collection Time: 02/17/24  4:33 PM  Result Value Ref Range   Sodium 133 (L) 135 - 145 mmol/L   Potassium 3.5 3.5 - 5.1 mmol/L   Chloride 99 98 - 111 mmol/L   CO2 23  22 - 32 mmol/L   Glucose, Bld 86 70 - 99 mg/dL   BUN 9 8 - 23 mg/dL   Creatinine, Ser 5.62 0.44 - 1.00 mg/dL   Calcium 8.7 (L) 8.9 - 10.3 mg/dL   Total Protein 7.3 6.5 - 8.1 g/dL   Albumin 3.9 3.5 - 5.0 g/dL   AST 20 15 - 41 U/L   ALT 9 0 - 44 U/L   Alkaline Phosphatase 100 38 - 126 U/L   Total Bilirubin 0.9 0.0 - 1.2 mg/dL   GFR, Estimated >13 >08 mL/min   Anion gap 11 5 - 15  Brain natriuretic peptide     Status: None   Collection Time: 02/17/24  4:33 PM  Result Value Ref Range   B Natriuretic Peptide 59.5 0.0 - 100.0 pg/mL  Troponin I (High Sensitivity)     Status: None   Collection Time: 02/17/24  4:33 PM  Result Value Ref Range   Troponin I (High Sensitivity) 11 <18 ng/L  I-Stat CG4 Lactic Acid     Status: None   Collection Time: 02/17/24  4:44 PM  Result Value Ref Range   Lactic Acid, Venous 1.3 0.5 - 1.9 mmol/L  Resp panel by RT-PCR (RSV, Flu A&B, Covid) Anterior Nasal Swab     Status: None   Collection Time: 02/17/24  5:07 PM   Specimen: Anterior Nasal Swab  Result Value Ref Range   SARS Coronavirus 2 by RT PCR NEGATIVE NEGATIVE   Influenza A by PCR NEGATIVE NEGATIVE   Influenza B by PCR NEGATIVE NEGATIVE   Resp Syncytial Virus by PCR NEGATIVE NEGATIVE   Basic Metabolic Panel: Recent Labs  Lab 02/17/24 1633  NA 133*  K 3.5  CL 99  CO2 23  GLUCOSE 86  BUN 9  CREATININE 0.81  CALCIUM 8.7*   Liver Function Tests: Recent Labs  Lab 02/17/24 1633  AST 20  ALT 9  ALKPHOS 100  BILITOT 0.9  PROT 7.3  ALBUMIN 3.9   No results for input(s): "LIPASE", "  AMYLASE" in the last 168 hours. No results for input(s): "AMMONIA" in the last 168 hours. CBC: Recent Labs  Lab 02/17/24 1633  WBC 4.4  HGB 13.9  HCT 41.8  MCV 91.5  PLT 264   Cardiac Enzymes: Recent Labs  Lab 02/17/24 1633  TROPONINIHS 11    BNP (last 3 results) No results for input(s): "PROBNP" in the last 8760 hours. CBG: No results for input(s): "GLUCAP" in the last 168  hours.  Radiological Exams on Admission:  DG Chest Port 1 View Result Date: 02/17/2024 CLINICAL DATA:  Short of breath and cough EXAM: PORTABLE CHEST 1 VIEW COMPARISON:  None Available. FINDINGS: Normal mediastinum and cardiac silhouette. Normal pulmonary vasculature. No evidence of effusion, infiltrate, or pneumothorax. No acute bony abnormality. Anterior cervical fusion. RIGHT axillary dissection clips IMPRESSION: No acute cardiopulmonary process. Electronically Signed   By: Genevive Bi M.D.   On: 02/17/2024 18:25    chest X-ray  EKG: Independently reviewed. Sinus tachy  No intake/output data recorded. No intake/output data recorded.      Assessment and Plan: COPD exacerbation (HCC) Other consideration include asthma exacerbation.  Patient symptomatically improved after treatment with methylprednisolone, magnesium as well as inhaled bronchodilator therapy in the ER.  I will continue with DuoNeb therapy every 4 hourly.  I will continue with methylprednisolone 40 mg twice daily.  And restart patient's inhaled corticosteroids and long-acting beta adrenergic agent.  Will also start the patient on montelukast. Pantop for GI ppx.  Snoking cessation discussed. Patient doe snot desire nicotine replacemtn  Hypertension, benign essential, goal below 140/90 I assume patient has not been taking her Hyzaar as well as her amlodipine at home - patietn is non specific in her report.  At this time I will restart patient's Hyzaar.  As needed agents labetalol and hydralazine has been ordered IV for more immediate blood pressure control.  Consider restarting amlodipine in the morning.   DVT ppx - lvoenox  Med rec pedinng pharmcy input    Advance Care Planning:   Code Status: Full Code   Consults: none  Family Communication: brother at bedside. Patient fully conversant. All questions answred.  Severity of Illness: The appropriate patient status for this patient is OBSERVATION. Observation  status is judged to be reasonable and necessary in order to provide the required intensity of service to ensure the patient's safety. The patient's presenting symptoms, physical exam findings, and initial radiographic and laboratory data in the context of their medical condition is felt to place them at decreased risk for further clinical deterioration. Furthermore, it is anticipated that the patient will be medically stable for discharge from the hospital within 2 midnights of admission.   Author: Nolberto Hanlon, MD 02/17/2024 7:55 PM  For on call review [email or web address].

## 2024-02-17 NOTE — ED Provider Notes (Signed)
 [place] EMERGENCY DEPARTMENT AT [facility] Provider Note   CSN: [id number] Arrival date & time: 02/17/24  1539     History  Chief Complaint  Patient presents with   Shortness of Breath   Cough    [name] is a 73 y.o. female.  Pt is a 73 yo female with pmhx significant for COPD, HLD, Depression, Anxiety, breast cancer, DM2, and tobacco abuse.  Pt said she has been sob for 2 days.  She still smokes, but is trying to quit.  She is having a hard time breathing and is a poor historian.        Home Medications Prior to Admission medications   Medication Sig Start Date End Date Taking? Authorizing Provider  acetaminophen (TYLENOL) 325 MG tablet Take 2 tablets (650 mg total) by mouth every 6 (six) hours as needed for mild pain (or Fever >/= 101). 08/23/22   Marolyn Haller, MD  albuterol (PROVENTIL) (2.5 MG/3ML) 0.083% nebulizer solution Take 2.5 mg by nebulization every 4 (four) hours as needed for shortness of breath or wheezing (for flares, when sick). 12/05/23   [provider]  albuterol (VENTOLIN HFA) 108 (90 Base) MCG/ACT inhaler Inhale 2 puffs into the lungs every 6 (six) hours as needed for wheezing or shortness of breath. 01/10/24   Rocky Morel, DO  amLODipine (NORVASC) 10 MG tablet Take 1 tablet (10 mg total) by mouth daily. 12/13/22 01/12/24  [name], MD  cetirizine (ZYRTEC) 5 MG tablet Take 5 mg by mouth at bedtime as needed for allergies or rhinitis.    [provider]  DULoxetine (CYMBALTA) 60 MG capsule Take 1 capsule (60 mg total) by mouth daily. 01/10/23 01/12/24  Willette Cluster, MD  fluticasone (FLONASE) 50 MCG/ACT nasal spray Place 1 spray into both nostrils daily. Patient taking differently: Place 1 spray into both nostrils See admin instructions. Instill 1 spray into each nostril one to two times a day as needed for rhinitis or allergies 07/14/23 07/13/24  Nooruddin, Jason Fila, MD  losartan-hydrochlorothiazide (HYZAAR)  50-12.5 MG tablet Take 1 tablet by mouth 2 (two) times daily. 03/30/23   [provider]  metoprolol tartrate (LOPRESSOR) 25 MG tablet Take 1 tablet (25 mg total) by mouth 2 (two) times daily. 12/13/22 01/12/24  [name], MD  tiotropium (SPIRIVA HANDIHALER) 18 MCG inhalation capsule Place 18 mcg into inhaler and inhale daily as needed (for flares- when sick).    [provider]      Allergies    Ace inhibitors    Review of Systems   Review of Systems  Respiratory:  Positive for cough, shortness of breath and wheezing.   All other systems reviewed and are negative.   Physical Exam Updated Vital Signs BP (!) 183/91   Pulse (!) 102   Temp 98.2 F (36.8 C) (Oral)   Resp (!) 22   Ht 5\' 5"  (1.651 m)   Wt 54.4 kg   SpO2 96%   BMI 19.97 kg/m  Physical Exam Vitals and nursing note reviewed.  Constitutional:      Appearance: She is well-developed.  HENT:     Head: Normocephalic and atraumatic.     Mouth/Throat:     Mouth: Mucous membranes are moist.     Pharynx: Oropharynx is clear.  Eyes:     Extraocular Movements: Extraocular movements intact.     Pupils: Pupils are equal, round, and reactive to light.  Cardiovascular:     Rate and Rhythm: Regular rhythm. Tachycardia  present.  Pulmonary:     Breath sounds: Wheezing present.  Abdominal:     General: Bowel sounds are normal.     Palpations: Abdomen is soft.  Musculoskeletal:        General: Normal range of motion.     Cervical back: Normal range of motion and neck supple.  Skin:    General: Skin is warm.     Capillary Refill: Capillary refill takes less than 2 seconds.  Neurological:     General: No focal deficit present.     Mental Status: She is alert and oriented to person, place, and time.  Psychiatric:        Mood and Affect: Mood normal.        Behavior: Behavior normal.     ED Results / Procedures / Treatments   Labs (all labs ordered are listed, but only abnormal results are  displayed) Labs Reviewed  COMPREHENSIVE METABOLIC PANEL WITH GFR - Abnormal; Notable for the following components:      Result Value   Sodium 133 (*)    Calcium 8.7 (*)    All other components within normal limits  RESP PANEL BY RT-PCR (RSV, FLU A&B, COVID)  [id number]  CULTURE, BLOOD (ROUTINE X 2)  CULTURE, BLOOD (ROUTINE X 2)  CBC  BRAIN NATRIURETIC PEPTIDE  I-STAT CG4 LACTIC ACID, ED  I-STAT CG4 LACTIC ACID, ED  TROPONIN I (HIGH SENSITIVITY)  TROPONIN I (HIGH SENSITIVITY)    EKG EKG Interpretation Date/Time:  Friday February 17 2024 15:52:11 EDT Ventricular Rate:  120 PR Interval:  125 QRS Duration:  113 QT Interval:  342 QTC Calculation: 482 R Axis:   46  Text Interpretation: Sinus tachycardia Multiform ventricular premature complexes Consider right atrial enlargement Anteroseptal infarct, old Repol abnrm suggests ischemia, inferior leads Baseline wander in lead(s) I II III aVR aVF V4 V5 V6 No significant change since last tracing Confirmed by Jacalyn Lefevre [phone]) on 02/17/2024 4:13:16 PM  Radiology DG Chest Port 1 View Result Date: 02/17/2024 CLINICAL DATA:  Short of breath and cough EXAM: PORTABLE CHEST 1 VIEW COMPARISON:  None Available. FINDINGS: Normal mediastinum and cardiac silhouette. Normal pulmonary vasculature. No evidence of effusion, infiltrate, or pneumothorax. No acute bony abnormality. Anterior cervical fusion. RIGHT axillary dissection clips IMPRESSION: No acute cardiopulmonary process. Electronically Signed   By: Genevive Bi M.D.   On: 02/17/2024 18:25    Procedures Procedures    Medications Ordered in ED Medications  albuterol (PROVENTIL) (2.5 MG/3ML) 0.083% nebulizer solution 5 mg (has no administration in time range)  acetaminophen (TYLENOL) tablet 1,000 mg (has no administration in time range)  labetalol (NORMODYNE) injection 10 mg (has no administration in time range)  ipratropium-albuterol (DUONEB) 0.5-2.5 (3) MG/3ML nebulizer solution 3 mL (3  mLs Nebulization Given 02/17/24 1607)  albuterol (PROVENTIL) (2.5 MG/3ML) 0.083% nebulizer solution 5 mg (5 mg Nebulization Given 02/17/24 1607)  methylPREDNISolone sodium succinate (SOLU-MEDROL) 125 mg/2 mL injection 125 mg (125 mg Intravenous Given 02/17/24 1700)  magnesium sulfate IVPB 2 g 50 mL (2 g Intravenous New Bag/Given 02/17/24 1641)    ED Course/ Medical Decision Making/ A&P                                 Medical Decision Making Amount and/or Complexity of Data Reviewed Labs: ordered. Radiology: ordered.  Risk OTC drugs. Prescription drug management. Decision regarding hospitalization.   This patient presents to the ED for concern  of sob, this involves an extensive number of treatment options, and is a complaint that carries with it a high risk of complications and morbidity.  The differential diagnosis includes copd exacerbation, covid/flu/rsv, pna   Co morbidities that complicate the patient evaluation  COPD, HLD, Depression, Anxiety, breast cancer, DM2, and tobacco abuse   Additional history obtained:  Additional history obtained from epic chart review  Lab Tests:  I Ordered, and personally interpreted labs.  The pertinent results include:  cbc nl, cmp nl, bnp nl, trop nl, lactic nl, covid/flu/rsv neg   Imaging Studies ordered:  I ordered imaging studies including cxr  I independently visualized and interpreted imaging which showed No acute cardiopulmonary process.  I agree with the radiologist interpretation   Cardiac Monitoring:  The patient was maintained on a cardiac monitor.  I personally viewed and interpreted the cardiac monitored which showed an underlying rhythm of: st   Medicines ordered and prescription drug management:  I ordered medication including nebs/steroids/mg  for sx  Reevaluation of the patient after these medicines showed that the patient improved I have reviewed the patients home medicines and have made adjustments as  needed   Test Considered:  ct   Critical Interventions:  nebs   Consultations Obtained:  I requested consultation with the hospitalist (Dr. Maryjean Ka),  and discussed lab and imaging findings as well as pertinent plan - he will admit   Problem List / ED Course:  COPD exac:  pt has improved with nebs, but is still sob and is wheezing.   Tobacco abuse:  pt encouraged to continue to try to stop. HTN:  pt did not take home meds today.  Labetalol given in ED and bp has improved.   Reevaluation:  After the interventions noted above, I reevaluated the patient and found that they have :improved   Social Determinants of Health:  Lives at home   Dispostion:  After consideration of the diagnostic results and the patients response to treatment, I feel that the patent would benefit from admission.          Final Clinical Impression(s) / ED Diagnoses Final diagnoses:  COPD exacerbation (HCC)  Hypertension, unspecified type  Tobacco abuse    Rx / DC Orders ED Discharge Orders     None         Jacalyn Lefevre, MD 02/17/24 [phone]

## 2024-02-17 NOTE — ED Triage Notes (Signed)
 Pt arrived via POV. C/o SOB for 2x days. Hx COPD, and asthma.  SPO2 at 100 on RA, struggling to catch breath.  AOx4

## 2024-02-17 NOTE — Assessment & Plan Note (Addendum)
 I assume patient has not been taking her Hyzaar as well as her amlodipine at home - patietn is non specific in her report.  At this time I will restart patient's Hyzaar.  As needed agents labetalol and hydralazine has been ordered IV for more immediate blood pressure control.  Consider restarting amlodipine in the morning.

## 2024-02-18 ENCOUNTER — Encounter (HOSPITAL_COMMUNITY): Payer: Self-pay | Admitting: Internal Medicine

## 2024-02-18 DIAGNOSIS — J441 Chronic obstructive pulmonary disease with (acute) exacerbation: Secondary | ICD-10-CM | POA: Diagnosis present

## 2024-02-18 DIAGNOSIS — Z853 Personal history of malignant neoplasm of breast: Secondary | ICD-10-CM | POA: Diagnosis not present

## 2024-02-18 DIAGNOSIS — E871 Hypo-osmolality and hyponatremia: Secondary | ICD-10-CM | POA: Diagnosis present

## 2024-02-18 DIAGNOSIS — Z90710 Acquired absence of both cervix and uterus: Secondary | ICD-10-CM | POA: Diagnosis not present

## 2024-02-18 DIAGNOSIS — Z1152 Encounter for screening for COVID-19: Secondary | ICD-10-CM | POA: Diagnosis not present

## 2024-02-18 DIAGNOSIS — E785 Hyperlipidemia, unspecified: Secondary | ICD-10-CM | POA: Diagnosis present

## 2024-02-18 DIAGNOSIS — M170 Bilateral primary osteoarthritis of knee: Secondary | ICD-10-CM | POA: Diagnosis present

## 2024-02-18 DIAGNOSIS — M19042 Primary osteoarthritis, left hand: Secondary | ICD-10-CM | POA: Diagnosis present

## 2024-02-18 DIAGNOSIS — E860 Dehydration: Secondary | ICD-10-CM | POA: Diagnosis present

## 2024-02-18 DIAGNOSIS — Z803 Family history of malignant neoplasm of breast: Secondary | ICD-10-CM | POA: Diagnosis not present

## 2024-02-18 DIAGNOSIS — F32A Depression, unspecified: Secondary | ICD-10-CM | POA: Diagnosis present

## 2024-02-18 DIAGNOSIS — Z923 Personal history of irradiation: Secondary | ICD-10-CM | POA: Diagnosis not present

## 2024-02-18 DIAGNOSIS — E119 Type 2 diabetes mellitus without complications: Secondary | ICD-10-CM | POA: Diagnosis present

## 2024-02-18 DIAGNOSIS — Z981 Arthrodesis status: Secondary | ICD-10-CM | POA: Diagnosis not present

## 2024-02-18 DIAGNOSIS — E876 Hypokalemia: Secondary | ICD-10-CM | POA: Diagnosis not present

## 2024-02-18 DIAGNOSIS — F17210 Nicotine dependence, cigarettes, uncomplicated: Secondary | ICD-10-CM | POA: Diagnosis present

## 2024-02-18 DIAGNOSIS — Z79899 Other long term (current) drug therapy: Secondary | ICD-10-CM | POA: Diagnosis not present

## 2024-02-18 DIAGNOSIS — I10 Essential (primary) hypertension: Secondary | ICD-10-CM | POA: Diagnosis present

## 2024-02-18 DIAGNOSIS — J9601 Acute respiratory failure with hypoxia: Secondary | ICD-10-CM | POA: Diagnosis present

## 2024-02-18 DIAGNOSIS — E872 Acidosis, unspecified: Secondary | ICD-10-CM | POA: Diagnosis present

## 2024-02-18 DIAGNOSIS — Z888 Allergy status to other drugs, medicaments and biological substances status: Secondary | ICD-10-CM | POA: Diagnosis not present

## 2024-02-18 DIAGNOSIS — Z716 Tobacco abuse counseling: Secondary | ICD-10-CM | POA: Diagnosis not present

## 2024-02-18 DIAGNOSIS — R0602 Shortness of breath: Secondary | ICD-10-CM | POA: Diagnosis present

## 2024-02-18 DIAGNOSIS — Z9221 Personal history of antineoplastic chemotherapy: Secondary | ICD-10-CM | POA: Diagnosis not present

## 2024-02-18 DIAGNOSIS — M19041 Primary osteoarthritis, right hand: Secondary | ICD-10-CM | POA: Diagnosis present

## 2024-02-18 LAB — BASIC METABOLIC PANEL WITH GFR
Anion gap: 13 (ref 5–15)
BUN: 14 mg/dL (ref 8–23)
CO2: 20 mmol/L — ABNORMAL LOW (ref 22–32)
Calcium: 9 mg/dL (ref 8.9–10.3)
Chloride: 100 mmol/L (ref 98–111)
Creatinine, Ser: 0.72 mg/dL (ref 0.44–1.00)
GFR, Estimated: >60 mL/min (ref >60)
Glucose, Bld: 196 mg/dL — ABNORMAL HIGH (ref 70–99)
Potassium: 3.5 mmol/L (ref 3.5–5.1)
Sodium: 133 mmol/L — ABNORMAL LOW (ref 135–145)

## 2024-02-18 LAB — CBC
HCT: 40.6 % (ref 36.0–46.0)
Hemoglobin: 12.9 g/dL (ref 12.0–15.0)
MCH: 29.7 pg (ref 26.0–34.0)
MCHC: 31.8 g/dL (ref 30.0–36.0)
MCV: 93.5 fL (ref 80.0–100.0)
Platelets: 274 10*3/uL (ref 150–400)
RBC: 4.34 MIL/uL (ref 3.87–5.11)
RDW: 13.6 % (ref 11.5–15.5)
WBC: 2.2 10*3/uL — ABNORMAL LOW (ref 4.0–10.5)
nRBC: 0 % (ref 0.0–0.2)

## 2024-02-18 LAB — LACTIC ACID, PLASMA: Lactic Acid, Venous: 4.3 mmol/L (ref 0.5–1.9)

## 2024-02-18 LAB — PROTIME-INR
INR: 0.9 (ref 0.8–1.2)
Prothrombin Time: 12.7 s (ref 11.4–15.2)

## 2024-02-18 LAB — APTT: aPTT: 28 s (ref 24–36)

## 2024-02-18 MED ORDER — DULOXETINE HCL 60 MG PO CPEP
60.0000 mg | ORAL_CAPSULE | Freq: Every day | ORAL | Status: DC
  Administered 2024-02-18 – 2024-02-22 (×5): 60 mg via ORAL
  Filled 2024-02-18 (×5): qty 1

## 2024-02-18 MED ORDER — SODIUM CHLORIDE 0.9 % IV SOLN
INTRAVENOUS | Status: AC
Start: 2024-02-18 — End: 2024-02-19

## 2024-02-18 MED ORDER — ORAL CARE MOUTH RINSE: 15.0000 mL | OROMUCOSAL | Status: DC | PRN

## 2024-02-18 MED ORDER — AMLODIPINE BESYLATE 10 MG PO TABS
10.0000 mg | ORAL_TABLET | Freq: Every day | ORAL | Status: DC
  Administered 2024-02-18 – 2024-02-22 (×5): 10 mg via ORAL
  Filled 2024-02-18 (×5): qty 1

## 2024-02-18 MED ORDER — GUAIFENESIN-DM 100-10 MG/5ML PO SYRP
5.0000 mL | ORAL_SOLUTION | ORAL | Status: DC | PRN
  Administered 2024-02-18 – 2024-02-21 (×6): 5 mL via ORAL
  Filled 2024-02-18 (×6): qty 5

## 2024-02-18 NOTE — Plan of Care (Signed)

## 2024-02-18 NOTE — Care Management Obs Status (Signed)
 MEDICARE OBSERVATION STATUS NOTIFICATION   Patient Details  Name: [name] MRN: [id number] Date of Birth: Apr 24, 1951   Medicare Observation Status Notification Given:  Yes    Tyreak Reagle Liane Redman, LCSW 02/18/2024, 5:21 PM

## 2024-02-18 NOTE — Progress Notes (Signed)
   02/18/24 1723  TOC Brief Assessment  Insurance and Status Reviewed  Patient has primary care physician Yes  Home environment has been reviewed From home, spouse  Prior level of function: Independent  Prior/Current Home Services No current home services  Social Drivers of Health Review SDOH reviewed no interventions necessary  Readmission risk has been reviewed Yes  Transition of care needs no transition of care needs at this time

## 2024-02-18 NOTE — Progress Notes (Addendum)
 PROGRESS NOTE    [name]  ZOX:[id number] DOB: 1951-02-28 DOA: 02/17/2024 PCP: Cathey Clunes, MD  Brief Narrative:  This 73 years old female with PMH significant for COPD and asthma presented in the ED with worsening shortness of breath. Patient states she ran out of her Symbicort few days ago but med history suggests patient has not been taking her medications for a long time. She reports she was in her usual state of health 48 hours ago but then she has developed shortness of breath initially with exertion but now at rest.  Patient reports her shortness of breath got worse today and in spite of her using rescue inhalers twice she did not get any relief.  In the ED she was tachypneic and has subjective respiratory distress on evaluation.  Patient was given magnesium sulfate, methylprednisone and nebulized bronchodilators,  She felt improved.  She did not require supplemental oxygen during hospitalization.  Patient  was admitted for further evaluation.  Assessment & Plan:   Active Problems:   Hypertension, benign essential, goal below 140/90   COPD exacerbation (HCC)   COPD exacerbation (HCC) This could be COPD and asthma exacerbation.   Patient symptomatically improved after treatment with methylprednisolone, magnesium as well as inhaled bronchodilator therapy in the ER.   Continue DuoNeb therapy every 4 hourly.   Continue methylprednisolone 40 mg twice daily.   Resume inhaled corticosteroids and long-acting beta adrenergic agent.   Continue montelukast. Continue pantoprazole 40 mg daily Patient does not require supplemental oxygen. Smoking cessation discussed. Patient does not desire nicotine replacement. Chest x-ray no acute infiltrate. Influenza COVID RSV negative.    Essential hypertension: Appears patient has not been taking her Hyzaar as well as her amlodipine at home. Continue Hyzaar.  Restart amlodipine.  Depression Continue Cymbalta.  Lactic  acidosis: No signs of infection noted,  could be due to dehydration. Continue IV fluid resuscitation.  DVT prophylaxis: Lovenox Code Status: Full code Family Communication: No family at bedside Disposition Plan:   Status is: Observation The patient remains OBS appropriate and will d/c before 2 midnights.    Consultants:  None  Procedures: None  Antimicrobials:  Anti-infectives (From admission, onward)    None      Subjective: Patient was seen and examined at bedside. Overnight events noted.   Patient has significant wheezing and was getting nebulization treatment. Patient reports feeling slightly better but still has significant wheezing on exam.  Objective: Vitals:   02/18/24 0524 02/18/24 0600 02/18/24 0742 02/18/24 1136  BP: (!) 180/73 (!) 169/67 (!) 154/75   Pulse: 79 77 94   Resp: 16  18   Temp: 97.9 F (36.6 C)  97.7 F (36.5 C)   TempSrc: Oral  Oral   SpO2: 100%  99% 98%  Weight:      Height:        Intake/Output Summary (Last 24 hours) at 02/18/2024 1148 Last data filed at 02/18/2024 0427 Gross per 24 hour  Intake 480 ml  Output --  Net 480 ml   Filed Weights   02/17/24 1549  Weight: 54.4 kg    Examination:  General exam: Appears calm and comfortable, deconditioned, not in any acute distress. Respiratory system: Wheezing bilaterally. Respiratory effort normal.  RR 16 Cardiovascular system: S1 & S2 heard, RRR. No JVD, murmurs, rubs, gallops or clicks.  Gastrointestinal system: Abdomen is non distended, soft and non tender.  Normal bowel sounds heard. Central nervous system: Alert and oriented x 3. No focal neurological deficits.  Extremities: No edema, no cyanosis, no clubbing Skin: No rashes, lesions or ulcers Psychiatry: Judgement and insight appear normal. Mood & affect appropriate.   Data Reviewed: I have personally reviewed following labs and imaging studies  CBC: Recent Labs  Lab 02/17/24 1633 02/18/24 0805  WBC 4.4 2.2*  HGB 13.9  12.9  HCT 41.8 40.6  MCV 91.5 93.5  PLT 264 274   Basic Metabolic Panel: Recent Labs  Lab 02/17/24 1633 02/18/24 0805  NA 133* 133*  K 3.5 3.5  CL 99 100  CO2 23 20*  GLUCOSE 86 196*  BUN 9 14  CREATININE 0.81 0.72  CALCIUM 8.7* 9.0   GFR: Estimated Creatinine Clearance: 53.8 mL/min (by C-G formula based on SCr of 0.72 mg/dL). Liver Function Tests: Recent Labs  Lab 02/17/24 1633  AST 20  ALT 9  ALKPHOS 100  BILITOT 0.9  PROT 7.3  ALBUMIN 3.9   No results for input(s): "LIPASE", "AMYLASE" in the last 168 hours. No results for input(s): "AMMONIA" in the last 168 hours. Coagulation Profile: Recent Labs  Lab 02/18/24 0805  INR 0.9   Cardiac Enzymes: No results for input(s): "CKTOTAL", "CKMB", "CKMBINDEX", "TROPONINI" in the last 168 hours. BNP (last 3 results) No results for input(s): "PROBNP" in the last 8760 hours. HbA1C: No results for input(s): "HGBA1C" in the last 72 hours. CBG: No results for input(s): "GLUCAP" in the last 168 hours. Lipid Profile: No results for input(s): "CHOL", "HDL", "LDLCALC", "TRIG", "CHOLHDL", "LDLDIRECT" in the last 72 hours. Thyroid Function Tests: No results for input(s): "TSH", "T4TOTAL", "FREET4", "T3FREE", "THYROIDAB" in the last 72 hours. Anemia Panel: No results for input(s): "VITAMINB12", "FOLATE", "FERRITIN", "TIBC", "IRON", "RETICCTPCT" in the last 72 hours. Sepsis Labs: Recent Labs  Lab 02/17/24 1644 02/17/24 1923 02/18/24 0805  LATICACIDVEN 1.3 2.6* 4.3*    Recent Results (from the past 240 hours)  Culture, blood (routine x 2)     Status: None (Preliminary result)   Collection Time: 02/17/24  4:33 PM   Specimen: BLOOD  Result Value Ref Range Status   Specimen Description   Final    BLOOD BLOOD RIGHT FOREARM Performed at [facility], [address], [place], [place] [address]    Special Requests   Final    BOTTLES DRAWN AEROBIC AND ANAEROBIC Blood Culture adequate volume Performed at  [facility], [address], [place], [place] [address]    Culture   Final    NO GROWTH < 12 HOURS Performed at [facility], [address] [place], [place] [address]    Report Status PENDING  Incomplete  Culture, blood (routine x 2)     Status: None (Preliminary result)   Collection Time: 02/17/24  5:00 PM   Specimen: BLOOD  Result Value Ref Range Status   Specimen Description   Final    BLOOD BLOOD LEFT ARM Performed at [facility], [address], [place], [place] [address]    Special Requests   Final    BOTTLES DRAWN AEROBIC AND ANAEROBIC Blood Culture adequate volume Performed at [facility], [address], [place], [place] [address]    Culture   Final    NO GROWTH < 12 HOURS Performed at [facility], [address] [place], [place] [address]    Report Status PENDING  Incomplete  Resp panel by RT-PCR (RSV, Flu A&B, Covid) Anterior Nasal Swab     Status: None   Collection Time:  02/17/24  5:07 PM   Specimen: Anterior Nasal Swab  Result Value Ref Range Status   SARS Coronavirus 2 by RT PCR NEGATIVE NEGATIVE Final    Comment: (NOTE) SARS-CoV-2 target nucleic acids are NOT DETECTED.  The SARS-CoV-2 RNA is generally detectable in upper respiratory specimens during the acute phase of infection. The lowest concentration of SARS-CoV-2 viral copies this assay can detect is 138 copies/mL. A negative result does not preclude SARS-Cov-2 infection and should not be used as the sole basis for treatment or other patient management decisions. A negative result may occur with  improper specimen collection/handling, submission of specimen other than nasopharyngeal swab, presence of viral mutation(s) within the areas targeted by this assay, and inadequate number of viral copies(<138 copies/mL). A negative result must be combined with clinical observations, patient history, and epidemiological information. The  expected result is Negative.  Fact Sheet for Patients:  BloggerCourse.com  Fact Sheet for Healthcare Providers:  SeriousBroker.it  This test is no t yet approved or cleared by the [place]  FDA and  has been authorized for detection and/or diagnosis of SARS-CoV-2 by FDA under an Emergency Use Authorization (EUA). This EUA will remain  in effect (meaning this test can be used) for the duration of the COVID-19 declaration under Section 564(b)(1) of the Act, 21 U.S.C.section 360bbb-3(b)(1), unless the authorization is terminated  or revoked sooner.       Influenza A by PCR NEGATIVE NEGATIVE Final   Influenza B by PCR NEGATIVE NEGATIVE Final    Comment: (NOTE) The Xpert Xpress SARS-CoV-2/FLU/RSV plus assay is intended as an aid in the diagnosis of influenza from Nasopharyngeal swab specimens and should not be used as a sole basis for treatment. Nasal washings and aspirates are unacceptable for Xpert Xpress SARS-CoV-2/FLU/RSV testing.  Fact Sheet for Patients: BloggerCourse.com  Fact Sheet for Healthcare Providers: SeriousBroker.it  This test is not yet approved or cleared by the [place]  FDA and has been authorized for detection and/or diagnosis of SARS-CoV-2 by FDA under an Emergency Use Authorization (EUA). This EUA will remain in effect (meaning this test can be used) for the duration of the COVID-19 declaration under Section 564(b)(1) of the Act, 21 U.S.C. section 360bbb-3(b)(1), unless the authorization is terminated or revoked.     Resp Syncytial Virus by PCR NEGATIVE NEGATIVE Final    Comment: (NOTE) Fact Sheet for Patients: BloggerCourse.com  Fact Sheet for Healthcare Providers: SeriousBroker.it  This test is not yet approved or cleared by the [place]  FDA and has been authorized for detection and/or  diagnosis of SARS-CoV-2 by FDA under an Emergency Use Authorization (EUA). This EUA will remain in effect (meaning this test can be used) for the duration of the COVID-19 declaration under Section 564(b)(1) of the Act, 21 U.S.C. section 360bbb-3(b)(1), unless the authorization is terminated or revoked.  Performed at [facility], [address] [place], [place] [address]     Radiology Studies: [facility] Chest Port 1 View Result Date: 02/17/2024 CLINICAL DATA:  Short of breath and cough EXAM: PORTABLE CHEST 1 VIEW COMPARISON:  None Available. FINDINGS: Normal mediastinum and cardiac silhouette. Normal pulmonary vasculature. No evidence of effusion, infiltrate, or pneumothorax. No acute bony abnormality. Anterior cervical fusion. RIGHT axillary dissection clips IMPRESSION: No acute cardiopulmonary process. Electronically Signed   By: Deboraha Fallow M.D.   On: 02/17/2024 18:25   Scheduled Meds:  amLODipine  10 mg Oral Daily   DULoxetine  60 mg Oral Daily   enoxaparin (LOVENOX) injection  40 mg Subcutaneous Q24H   fluticasone furoate-vilanterol  1 puff Inhalation Daily   losartan  50 mg Oral Daily   And   hydrochlorothiazide  12.5 mg Oral Daily   ipratropium-albuterol  3 mL Nebulization Q4H   methylPREDNISolone (SOLU-MEDROL) injection  40 mg Intravenous Q12H   montelukast  5 mg Oral QHS   pantoprazole  20 mg Oral Daily   sodium chloride flush  3 mL Intravenous Q12H   Continuous Infusions:  sodium chloride 40 mL/hr at 02/18/24 0943     LOS: 0 days    Time spent: 50 mins    Magdalene School, MD [facility]   If 7PM-7AM, please contact night-coverage

## 2024-02-19 DIAGNOSIS — J441 Chronic obstructive pulmonary disease with (acute) exacerbation: Secondary | ICD-10-CM | POA: Diagnosis not present

## 2024-02-19 MED ORDER — HYDROXYZINE HCL 10 MG PO TABS
10.0000 mg | ORAL_TABLET | Freq: Once | ORAL | Status: AC | PRN
  Administered 2024-02-19: 10 mg via ORAL
  Filled 2024-02-19: qty 1

## 2024-02-19 MED ORDER — HYDROCOD POLI-CHLORPHE POLI ER 10-8 MG/5ML PO SUER
5.0000 mL | Freq: Two times a day (BID) | ORAL | Status: DC | PRN
  Administered 2024-02-19: 5 mL via ORAL
  Filled 2024-02-19: qty 5

## 2024-02-19 MED ORDER — DOXYCYCLINE HYCLATE 100 MG PO TABS
100.0000 mg | ORAL_TABLET | Freq: Two times a day (BID) | ORAL | Status: DC
  Administered 2024-02-19 – 2024-02-22 (×7): 100 mg via ORAL
  Filled 2024-02-19 (×7): qty 1

## 2024-02-19 MED ORDER — SALINE SPRAY 0.65 % NA SOLN: 1.0000 | NASAL | Status: DC | PRN

## 2024-02-19 NOTE — Progress Notes (Signed)
 PROGRESS NOTE    [name]  ZOX:[id number] DOB: 11/10/1950 DOA: 02/17/2024 PCP: Cathey Clunes, MD  Brief Narrative:  This 73 years old female with PMH significant for COPD and asthma presented in the ED with worsening shortness of breath. Patient states she ran out of her Symbicort few days ago but med history suggests patient has not been taking her medications for a long time. She reports she was in her usual state of health 48 hours ago but then she has developed shortness of breath initially with exertion but now at rest.  Patient reports her shortness of breath got worse today and in spite of her using rescue inhalers twice she did not get any relief.  In the ED she was tachypneic and has subjective respiratory distress on evaluation.  Patient was given magnesium sulfate, methylprednisone and nebulized bronchodilators,  She felt improved.  She did not require supplemental oxygen during hospitalization.  Patient  was admitted for further evaluation.  Assessment & Plan:   Active Problems:   Hypertension, benign essential, goal below 140/90   COPD exacerbation (HCC)   COPD with Acute exacerbation (HCC) This could be COPD and asthma exacerbation.   Patient symptomatically improved after treatment with methylprednisolone, magnesium as well as inhaled bronchodilator therapy in the ER.   Continue DuoNeb therapy every 4 hourly.   Continue methylprednisolone 40 mg twice daily.   Continue inhaled corticosteroids and long-acting beta adrenergic agent.   Continue montelukast. Continue pantoprazole 40 mg daily. Patient does not require supplemental oxygen. Smoking cessation discussed. Patient does not desire nicotine replacement. Chest x-ray no acute infiltrate. Influenza COVID RSV negative.  Essential hypertension: Appears patient has not been taking her Hyzaar as well as her amlodipine at home. Continue Hyzaar.  Restart amlodipine.  Depression Continue  Cymbalta.  Lactic acidosis: No signs of infection noted,  could be due to dehydration. Continue IV fluid resuscitation.  DVT prophylaxis: Lovenox Code Status: Full code Family Communication: No family at bedside Disposition Plan:   Status is: Inpatient Remains inpatient appropriate because:  Admitted for COPD with acute exacerbation    Consultants:  None  Procedures: None  Antimicrobials:  Anti-infectives (From admission, onward)    Start     Dose/Rate Route Frequency Ordered Stop   02/19/24 1045  doxycycline (VIBRA-TABS) tablet 100 mg        100 mg Oral Every 12 hours 02/19/24 0959        Subjective: Patient was seen and examined at bedside. Overnight events noted.   Patient still has significant wheezing and given nebulization treatment. Patient reports feeling slightly better but still has significant wheezing on exam.  Objective: Vitals:   02/19/24 0640 02/19/24 0814 02/19/24 0934 02/19/24 1143  BP: (!) 174/82  (!) 173/84 (!) 170/77  Pulse:    (!) 102  Resp:      Temp:      TempSrc:      SpO2:  100%  100%  Weight:      Height:        Intake/Output Summary (Last 24 hours) at 02/19/2024 1208 Last data filed at 02/19/2024 0935 Gross per 24 hour  Intake 2254.33 ml  Output --  Net 2254.33 ml   Filed Weights   02/17/24 1549  Weight: 54.4 kg    Examination:  General exam: Appears comfortable, deconditioned, not in any acute distress. Respiratory system: Wheezing bilaterally. Respiratory effort normal.  RR 14 Cardiovascular system: S1 & S2 heard, RRR. No JVD, murmurs, rubs, gallops or  clicks.  Gastrointestinal system: Abdomen is non distended, soft and non tender.  Normal bowel sounds heard. Central nervous system: Alert and oriented x 3. No focal neurological deficits. Extremities: No edema, no cyanosis, no clubbing Skin: No rashes, lesions or ulcers Psychiatry: Judgement and insight appear normal. Mood & affect appropriate.   Data Reviewed: I have  personally reviewed following labs and imaging studies  CBC: Recent Labs  Lab 02/17/24 1633 02/18/24 0805  WBC 4.4 2.2*  HGB 13.9 12.9  HCT 41.8 40.6  MCV 91.5 93.5  PLT 264 274   Basic Metabolic Panel: Recent Labs  Lab 02/17/24 1633 02/18/24 0805  NA 133* 133*  K 3.5 3.5  CL 99 100  CO2 23 20*  GLUCOSE 86 196*  BUN 9 14  CREATININE 0.81 0.72  CALCIUM 8.7* 9.0   GFR: Estimated Creatinine Clearance: 53.8 mL/min (by C-G formula based on SCr of 0.72 mg/dL). Liver Function Tests: Recent Labs  Lab 02/17/24 1633  AST 20  ALT 9  ALKPHOS 100  BILITOT 0.9  PROT 7.3  ALBUMIN 3.9   No results for input(s): "LIPASE", "AMYLASE" in the last 168 hours. No results for input(s): "AMMONIA" in the last 168 hours. Coagulation Profile: Recent Labs  Lab 02/18/24 0805  INR 0.9   Cardiac Enzymes: No results for input(s): "CKTOTAL", "CKMB", "CKMBINDEX", "TROPONINI" in the last 168 hours. BNP (last 3 results) No results for input(s): "PROBNP" in the last 8760 hours. HbA1C: No results for input(s): "HGBA1C" in the last 72 hours. CBG: No results for input(s): "GLUCAP" in the last 168 hours. Lipid Profile: No results for input(s): "CHOL", "HDL", "LDLCALC", "TRIG", "CHOLHDL", "LDLDIRECT" in the last 72 hours. Thyroid Function Tests: No results for input(s): "TSH", "T4TOTAL", "FREET4", "T3FREE", "THYROIDAB" in the last 72 hours. Anemia Panel: No results for input(s): "VITAMINB12", "FOLATE", "FERRITIN", "TIBC", "IRON", "RETICCTPCT" in the last 72 hours. Sepsis Labs: Recent Labs  Lab 02/17/24 1644 02/17/24 1923 02/18/24 0805  LATICACIDVEN 1.3 2.6* 4.3*    Recent Results (from the past 240 hours)  Culture, blood (routine x 2)     Status: None (Preliminary result)   Collection Time: 02/17/24  4:33 PM   Specimen: BLOOD  Result Value Ref Range Status   Specimen Description   Final    BLOOD BLOOD RIGHT FOREARM Performed at [facility], [address],  [place], [place] [address]    Special Requests   Final    BOTTLES DRAWN AEROBIC AND ANAEROBIC Blood Culture adequate volume Performed at [facility], [address] [place], [place] [address]    Culture   Final    NO GROWTH 2 DAYS Performed at [facility], [address], [place], [place] [address]    Report Status PENDING  Incomplete  Culture, blood (routine x 2)     Status: None (Preliminary result)   Collection Time: 02/17/24  5:00 PM   Specimen: BLOOD  Result Value Ref Range Status   Specimen Description   Final    BLOOD BLOOD LEFT ARM Performed at [facility], [address] [place], [place] [address]    Special Requests   Final    BOTTLES DRAWN AEROBIC AND ANAEROBIC Blood Culture adequate volume Performed at [facility], [address] [place], [place] [address]    Culture   Final    NO GROWTH 2 DAYS Performed at [facility], [address], [place], [place] [address]  Report Status PENDING  Incomplete  Resp panel by RT-PCR (RSV, Flu A&B, Covid) Anterior Nasal Swab     Status: None   Collection Time: 02/17/24  5:07 PM   Specimen: Anterior Nasal Swab  Result Value Ref Range Status   SARS Coronavirus 2 by RT PCR NEGATIVE NEGATIVE Final    Comment: (NOTE) SARS-CoV-2 target nucleic acids are NOT DETECTED.  The SARS-CoV-2 RNA is generally detectable in upper respiratory specimens during the acute phase of infection. The lowest concentration of SARS-CoV-2 viral copies this assay can detect is 138 copies/mL. A negative result does not preclude SARS-Cov-2 infection and should not be used as the sole basis for treatment or other patient management decisions. A negative result may occur with  improper specimen collection/handling, submission of specimen other than nasopharyngeal swab, presence of viral mutation(s) within the areas targeted by this assay, and inadequate number of viral copies(<138  copies/mL). A negative result must be combined with clinical observations, patient history, and epidemiological information. The expected result is Negative.  Fact Sheet for Patients:  BloggerCourse.com  Fact Sheet for Healthcare Providers:  SeriousBroker.it  This test is no t yet approved or cleared by the [place]  FDA and  has been authorized for detection and/or diagnosis of SARS-CoV-2 by FDA under an Emergency Use Authorization (EUA). This EUA will remain  in effect (meaning this test can be used) for the duration of the COVID-19 declaration under Section 564(b)(1) of the Act, 21 U.S.C.section 360bbb-3(b)(1), unless the authorization is terminated  or revoked sooner.       Influenza A by PCR NEGATIVE NEGATIVE Final   Influenza B by PCR NEGATIVE NEGATIVE Final    Comment: (NOTE) The Xpert Xpress SARS-CoV-2/FLU/RSV plus assay is intended as an aid in the diagnosis of influenza from Nasopharyngeal swab specimens and should not be used as a sole basis for treatment. Nasal washings and aspirates are unacceptable for Xpert Xpress SARS-CoV-2/FLU/RSV testing.  Fact Sheet for Patients: BloggerCourse.com  Fact Sheet for Healthcare Providers: SeriousBroker.it  This test is not yet approved or cleared by the [place]  FDA and has been authorized for detection and/or diagnosis of SARS-CoV-2 by FDA under an Emergency Use Authorization (EUA). This EUA will remain in effect (meaning this test can be used) for the duration of the COVID-19 declaration under Section 564(b)(1) of the Act, 21 U.S.C. section 360bbb-3(b)(1), unless the authorization is terminated or revoked.     Resp Syncytial Virus by PCR NEGATIVE NEGATIVE Final    Comment: (NOTE) Fact Sheet for Patients: BloggerCourse.com  Fact Sheet for Healthcare  Providers: SeriousBroker.it  This test is not yet approved or cleared by the [place]  FDA and has been authorized for detection and/or diagnosis of SARS-CoV-2 by FDA under an Emergency Use Authorization (EUA). This EUA will remain in effect (meaning this test can be used) for the duration of the COVID-19 declaration under Section 564(b)(1) of the Act, 21 U.S.C. section 360bbb-3(b)(1), unless the authorization is terminated or revoked.  Performed at [facility], [address], [place], [place] [address]     Radiology Studies: [facility] Chest Port 1 View Result Date: 02/17/2024 CLINICAL DATA:  Short of breath and cough EXAM: PORTABLE CHEST 1 VIEW COMPARISON:  None Available. FINDINGS: Normal mediastinum and cardiac silhouette. Normal pulmonary vasculature. No evidence of effusion, infiltrate, or pneumothorax. No acute bony abnormality. Anterior cervical fusion. RIGHT axillary dissection clips IMPRESSION: No acute cardiopulmonary process. Electronically Signed   By: Deboraha Fallow M.D.   On:  02/17/2024 18:25   Scheduled Meds:  amLODipine  10 mg Oral Daily   doxycycline  100 mg Oral Q12H   DULoxetine  60 mg Oral Daily   enoxaparin (LOVENOX) injection  40 mg Subcutaneous Q24H   fluticasone furoate-vilanterol  1 puff Inhalation Daily   losartan  50 mg Oral Daily   And   hydrochlorothiazide  12.5 mg Oral Daily   ipratropium-albuterol  3 mL Nebulization Q4H   methylPREDNISolone (SOLU-MEDROL) injection  40 mg Intravenous Q12H   montelukast  5 mg Oral QHS   pantoprazole  20 mg Oral Daily   sodium chloride flush  3 mL Intravenous Q12H   Continuous Infusions:     LOS: 1 day    Time spent: 35 mins    Magdalene School, MD [facility]   If 7PM-7AM, please contact night-coverage

## 2024-02-19 NOTE — Plan of Care (Signed)
   Problem: Clinical Measurements: Goal: Diagnostic test results will improve Outcome: Progressing Goal: Respiratory complications will improve Outcome: Progressing

## 2024-02-20 DIAGNOSIS — J441 Chronic obstructive pulmonary disease with (acute) exacerbation: Secondary | ICD-10-CM | POA: Diagnosis not present

## 2024-02-20 LAB — LACTIC ACID, PLASMA: Lactic Acid, Venous: 2.8 mmol/L (ref 0.5–1.9)

## 2024-02-20 MED ORDER — HYDROXYZINE HCL 10 MG PO TABS
10.0000 mg | ORAL_TABLET | Freq: Once | ORAL | Status: AC | PRN
  Administered 2024-02-20: 10 mg via ORAL
  Filled 2024-02-20: qty 1

## 2024-02-20 NOTE — Progress Notes (Signed)
 PROGRESS NOTE    [name]  ZOX:[id number] DOB: Dec 31, 1950 DOA: 02/17/2024 PCP: Cathey Clunes, MD  Brief Narrative:  This 73 years old female with PMH significant for COPD and asthma presented in the ED with worsening shortness of breath. Patient states she ran out of her Symbicort few days ago but med history suggests patient has not been taking her medications for a long time. She reports she was in her usual state of health 48 hours ago but then she has developed shortness of breath initially with exertion but now at rest.  Patient reports her shortness of breath got worse today and in spite of her using rescue inhalers twice she did not get any relief.  In the ED she was tachypneic and has subjective respiratory distress on evaluation.  Patient was given magnesium sulfate, methylprednisone and nebulized bronchodilators,  She felt improved.  She did not require supplemental oxygen during hospitalization.  Patient  was admitted for further evaluation.  Assessment & Plan:   Active Problems:   Hypertension, benign essential, goal below 140/90   COPD exacerbation (HCC)   COPD with Acute exacerbation (HCC) This could be COPD and asthma exacerbation.   Patient symptomatically improved after treatment with methylprednisolone, magnesium as well as inhaled bronchodilator therapy in the ER.   Continue DuoNeb therapy every 4 hourly.   Continue methylprednisolone 40 mg twice daily.   Continue inhaled corticosteroids and long-acting beta adrenergic agent.   Continue montelukast. Continue pantoprazole 40 mg daily. Patient does not require supplemental oxygen. Smoking cessation discussed. Patient does not desire nicotine replacement. Chest x-ray no acute infiltrate. Influenza COVID RSV negative.  Essential hypertension: Appears patient has not been taking her Hyzaar as well as her amlodipine at home. Continue Hyzaar.  Restart amlodipine.  Depression Continue  Cymbalta.  Lactic acidosis: No signs of infection noted,  could be due to dehydration. Continue IV fluid resuscitation.  DVT prophylaxis: Lovenox Code Status: Full code Family Communication: No family at bedside Disposition Plan:   Status is: Inpatient Remains inpatient appropriate because:  Admitted for COPD with acute exacerbation    Consultants:  None  Procedures: None  Antimicrobials:  Anti-infectives (From admission, onward)    Start     Dose/Rate Route Frequency Ordered Stop   02/19/24 1045  doxycycline (VIBRA-TABS) tablet 100 mg        100 mg Oral Every 12 hours 02/19/24 0959        Subjective: Patient was seen and examined at bedside. Overnight events noted.   Patient is still has significant wheezing noted on exam. Patient reports feeling slightly better ,feels not ready for discharge today.  Objective: Vitals:   02/20/24 0322 02/20/24 0523 02/20/24 0731 02/20/24 1017  BP:  (!) 170/76  (!) 163/74  Pulse:  82  (!) 102  Resp:  17    Temp:  98.6 F (37 C)    TempSrc:  Oral    SpO2: 96% 100% 99% 100%  Weight:      Height:        Intake/Output Summary (Last 24 hours) at 02/20/2024 1419 Last data filed at 02/20/2024 0900 Gross per 24 hour  Intake 240 ml  Output --  Net 240 ml   Filed Weights   02/17/24 1549  Weight: 54.4 kg    Examination:  General exam: Appears comfortable, deconditioned, not in any acute distress. Respiratory system: Wheezing bilaterally. Respiratory effort normal.  RR 14 Cardiovascular system: S1 & S2 heard, RRR. No JVD, murmurs, rubs, gallops  or clicks.  Gastrointestinal system: Abdomen is non distended, soft and non tender. Normal bowel sounds heard. Central nervous system: Alert and oriented x 3. No focal neurological deficits. Extremities: No edema, no cyanosis, no clubbing Skin: No rashes, lesions or ulcers Psychiatry: Judgement and insight appear normal. Mood & affect appropriate.   Data Reviewed: I have personally  reviewed following labs and imaging studies  CBC: Recent Labs  Lab 02/17/24 1633 02/18/24 0805  WBC 4.4 2.2*  HGB 13.9 12.9  HCT 41.8 40.6  MCV 91.5 93.5  PLT 264 274   Basic Metabolic Panel: Recent Labs  Lab 02/17/24 1633 02/18/24 0805  NA 133* 133*  K 3.5 3.5  CL 99 100  CO2 23 20*  GLUCOSE 86 196*  BUN 9 14  CREATININE 0.81 0.72  CALCIUM 8.7* 9.0   GFR: Estimated Creatinine Clearance: 53.8 mL/min (by C-G formula based on SCr of 0.72 mg/dL). Liver Function Tests: Recent Labs  Lab 02/17/24 1633  AST 20  ALT 9  ALKPHOS 100  BILITOT 0.9  PROT 7.3  ALBUMIN 3.9   No results for input(s): "LIPASE", "AMYLASE" in the last 168 hours. No results for input(s): "AMMONIA" in the last 168 hours. Coagulation Profile: Recent Labs  Lab 02/18/24 0805  INR 0.9   Cardiac Enzymes: No results for input(s): "CKTOTAL", "CKMB", "CKMBINDEX", "TROPONINI" in the last 168 hours. BNP (last 3 results) No results for input(s): "PROBNP" in the last 8760 hours. HbA1C: No results for input(s): "HGBA1C" in the last 72 hours. CBG: No results for input(s): "GLUCAP" in the last 168 hours. Lipid Profile: No results for input(s): "CHOL", "HDL", "LDLCALC", "TRIG", "CHOLHDL", "LDLDIRECT" in the last 72 hours. Thyroid Function Tests: No results for input(s): "TSH", "T4TOTAL", "FREET4", "T3FREE", "THYROIDAB" in the last 72 hours. Anemia Panel: No results for input(s): "VITAMINB12", "FOLATE", "FERRITIN", "TIBC", "IRON", "RETICCTPCT" in the last 72 hours. Sepsis Labs: Recent Labs  Lab 02/17/24 1644 02/17/24 1923 02/18/24 0805 02/20/24 0914  LATICACIDVEN 1.3 2.6* 4.3* 2.8*    Recent Results (from the past 240 hours)  Culture, blood (routine x 2)     Status: None (Preliminary result)   Collection Time: 02/17/24  4:33 PM   Specimen: BLOOD  Result Value Ref Range Status   Specimen Description   Final    BLOOD BLOOD RIGHT FOREARM Performed at [facility], [address], [place], [place] [address]    Special Requests   Final    BOTTLES DRAWN AEROBIC AND ANAEROBIC Blood Culture adequate volume Performed at [facility], [address] [place], [place] [address]    Culture   Final    NO GROWTH 3 DAYS Performed at [facility], [address] [place], [place] [address]    Report Status PENDING  Incomplete  Culture, blood (routine x 2)     Status: None (Preliminary result)   Collection Time: 02/17/24  5:00 PM   Specimen: BLOOD  Result Value Ref Range Status   Specimen Description   Final    BLOOD BLOOD LEFT ARM Performed at [facility], [address], [place], [place] [address]    Special Requests   Final    BOTTLES DRAWN AEROBIC AND ANAEROBIC Blood Culture adequate volume Performed at [facility], [address] [place], [place] [address]    Culture   Final    NO GROWTH 3 DAYS Performed at [facility], [address], [place], [place] [address]  Report Status PENDING  Incomplete  Resp panel by RT-PCR (RSV, Flu A&B, Covid) Anterior Nasal Swab     Status: None   Collection Time: 02/17/24  5:07 PM   Specimen: Anterior Nasal Swab  Result Value Ref Range Status   SARS Coronavirus 2 by RT PCR NEGATIVE NEGATIVE Final    Comment: (NOTE) SARS-CoV-2 target nucleic acids are NOT DETECTED.  The SARS-CoV-2 RNA is generally detectable in upper respiratory specimens during the acute phase of infection. The lowest concentration of SARS-CoV-2 viral copies this assay can detect is 138 copies/mL. A negative result does not preclude SARS-Cov-2 infection and should not be used as the sole basis for treatment or other patient management decisions. A negative result may occur with  improper specimen collection/handling, submission of specimen other than nasopharyngeal swab, presence of viral mutation(s) within the areas targeted by this assay, and inadequate number of  viral copies(<138 copies/mL). A negative result must be combined with clinical observations, patient history, and epidemiological information. The expected result is Negative.  Fact Sheet for Patients:  BloggerCourse.com  Fact Sheet for Healthcare Providers:  SeriousBroker.it  This test is no t yet approved or cleared by the [place] FDA and  has been authorized for detection and/or diagnosis of SARS-CoV-2 by FDA under an Emergency Use Authorization (EUA). This EUA will remain  in effect (meaning this test can be used) for the duration of the COVID-19 declaration under Section 564(b)(1) of the Act, 21 U.S.C.section 360bbb-3(b)(1), unless the authorization is terminated  or revoked sooner.       Influenza A by PCR NEGATIVE NEGATIVE Final   Influenza B by PCR NEGATIVE NEGATIVE Final    Comment: (NOTE) The Xpert Xpress SARS-CoV-2/FLU/RSV plus assay is intended as an aid in the diagnosis of influenza from Nasopharyngeal swab specimens and should not be used as a sole basis for treatment. Nasal washings and aspirates are unacceptable for Xpert Xpress SARS-CoV-2/FLU/RSV testing.  Fact Sheet for Patients: BloggerCourse.com  Fact Sheet for Healthcare Providers: SeriousBroker.it  This test is not yet approved or cleared by the [place] FDA and has been authorized for detection and/or diagnosis of SARS-CoV-2 by FDA under an Emergency Use Authorization (EUA). This EUA will remain in effect (meaning this test can be used) for the duration of the COVID-19 declaration under Section 564(b)(1) of the Act, 21 U.S.C. section 360bbb-3(b)(1), unless the authorization is terminated or revoked.     Resp Syncytial Virus by PCR NEGATIVE NEGATIVE Final    Comment: (NOTE) Fact Sheet for Patients: BloggerCourse.com  Fact Sheet for Healthcare  Providers: SeriousBroker.it  This test is not yet approved or cleared by the [place] FDA and has been authorized for detection and/or diagnosis of SARS-CoV-2 by FDA under an Emergency Use Authorization (EUA). This EUA will remain in effect (meaning this test can be used) for the duration of the COVID-19 declaration under Section 564(b)(1) of the Act, 21 U.S.C. section 360bbb-3(b)(1), unless the authorization is terminated or revoked.  Performed at [facility], [address] [place], [place] [address]     Radiology Studies: No results found.  Scheduled Meds:  amLODipine  10 mg Oral Daily   doxycycline  100 mg Oral Q12H   DULoxetine  60 mg Oral Daily   enoxaparin (LOVENOX) injection  40 mg Subcutaneous Q24H   fluticasone furoate-vilanterol  1 puff Inhalation Daily   losartan  50 mg Oral Daily   And   hydrochlorothiazide  12.5 mg Oral Daily  ipratropium-albuterol  3 mL Nebulization Q4H   montelukast  5 mg Oral QHS   pantoprazole  20 mg Oral Daily   sodium chloride flush  3 mL Intravenous Q12H   Continuous Infusions:     LOS: 2 days    Time spent: 35 mins    Magdalene School, MD [facility]   If 7PM-7AM, please contact night-coverage

## 2024-02-20 NOTE — Progress Notes (Signed)
   02/20/24 1610  Provider Notification  Provider Name/Title Magdalene School, MD  Date Provider Notified 02/20/24  Time Provider Notified [phone]  Method of Notification Page  Notification Reason Critical Result  Test performed and critical result Lactic Acid, 2.8  Date Critical Result Received 02/20/24  Time Critical Result Received 0954  Provider response No new orders;Other (Comment) (Responded- OK)  Date of Provider Response 02/20/24  Time of Provider Response 1030

## 2024-02-20 NOTE — Plan of Care (Signed)
 Pt extremely anxious throughout shift. One time dose of hydroxyzine administered per order.     Problem: Education: Goal: Knowledge of General Education information will improve Description: Including pain rating scale, medication(s)/side effects and non-pharmacologic comfort measures Outcome: Progressing   Problem: Activity: Goal: Risk for activity intolerance will decrease Outcome: Progressing   Problem: Nutrition: Goal: Adequate nutrition will be maintained Outcome: Progressing   Problem: Safety: Goal: Ability to remain free from injury will improve Outcome: Progressing

## 2024-02-20 NOTE — Plan of Care (Signed)

## 2024-02-21 DIAGNOSIS — E872 Acidosis, unspecified: Secondary | ICD-10-CM | POA: Diagnosis present

## 2024-02-21 DIAGNOSIS — I10 Essential (primary) hypertension: Secondary | ICD-10-CM | POA: Diagnosis not present

## 2024-02-21 DIAGNOSIS — J441 Chronic obstructive pulmonary disease with (acute) exacerbation: Secondary | ICD-10-CM | POA: Diagnosis not present

## 2024-02-21 LAB — BASIC METABOLIC PANEL WITH GFR
Anion gap: 10 (ref 5–15)
BUN: 13 mg/dL (ref 8–23)
CO2: 30 mmol/L (ref 22–32)
Calcium: 8.2 mg/dL — ABNORMAL LOW (ref 8.9–10.3)
Chloride: 94 mmol/L — ABNORMAL LOW (ref 98–111)
Creatinine, Ser: 0.61 mg/dL (ref 0.44–1.00)
GFR, Estimated: >60 mL/min (ref >60)
Glucose, Bld: 150 mg/dL — ABNORMAL HIGH (ref 70–99)
Potassium: 3.1 mmol/L — ABNORMAL LOW (ref 3.5–5.1)
Sodium: 134 mmol/L — ABNORMAL LOW (ref 135–145)

## 2024-02-21 LAB — CBC
HCT: 41.8 % (ref 36.0–46.0)
Hemoglobin: 13.8 g/dL (ref 12.0–15.0)
MCH: 30.9 pg (ref 26.0–34.0)
MCHC: 33 g/dL (ref 30.0–36.0)
MCV: 93.7 fL (ref 80.0–100.0)
Platelets: 286 10*3/uL (ref 150–400)
RBC: 4.46 MIL/uL (ref 3.87–5.11)
RDW: 13.8 % (ref 11.5–15.5)
WBC: 7.4 10*3/uL (ref 4.0–10.5)
nRBC: 0 % (ref 0.0–0.2)

## 2024-02-21 LAB — LACTIC ACID, PLASMA: Lactic Acid, Venous: 2.7 mmol/L (ref 0.5–1.9)

## 2024-02-21 MED ORDER — PREDNISONE 20 MG PO TABS
40.0000 mg | ORAL_TABLET | Freq: Every day | ORAL | Status: DC
  Administered 2024-02-22: 40 mg via ORAL
  Filled 2024-02-21: qty 2

## 2024-02-21 MED ORDER — SODIUM CHLORIDE 0.9 % IV SOLN: INTRAVENOUS | Status: DC

## 2024-02-21 MED ORDER — POTASSIUM CHLORIDE CRYS ER 20 MEQ PO TBCR
40.0000 meq | EXTENDED_RELEASE_TABLET | ORAL | Status: AC
  Administered 2024-02-21 (×2): 40 meq via ORAL
  Filled 2024-02-21 (×2): qty 2

## 2024-02-21 MED ORDER — HYDROXYZINE HCL 10 MG PO TABS
10.0000 mg | ORAL_TABLET | Freq: Once | ORAL | Status: AC
  Administered 2024-02-21: 10 mg via ORAL
  Filled 2024-02-21: qty 1

## 2024-02-21 MED ORDER — DM-GUAIFENESIN ER 30-600 MG PO TB12
1.0000 | ORAL_TABLET | Freq: Two times a day (BID) | ORAL | Status: DC
  Administered 2024-02-21 – 2024-02-22 (×3): 1 via ORAL
  Filled 2024-02-21 (×3): qty 1

## 2024-02-21 NOTE — Progress Notes (Signed)
 SATURATION QUALIFICATIONS: (This note is used to comply with regulatory documentation for home oxygen)  Patient Saturations on Room Air at Rest = 95%  Patient Saturations on Room Air while Ambulating = 93%  Patient Saturations on 3 Liters of oxygen while Ambulating = 96%  Please briefly explain why patient needs home oxygen:

## 2024-02-21 NOTE — Plan of Care (Signed)

## 2024-02-21 NOTE — Progress Notes (Addendum)
 PROGRESS NOTE    [name]  WUJ:[id number] DOB: 13-Dec-1950 DOA: 02/17/2024 PCP: [name], MD  Brief Narrative:  This 73 years old female with PMH significant for COPD and asthma presented in the ED with worsening shortness of breath. Patient states she ran out of her Symbicort few days ago but med history suggests patient has not been taking her medications for a long time. She reports she was in her usual state of health 48 hours ago but then she has developed shortness of breath initially with exertion but now at rest.  Patient reports her shortness of breath got worse today and in spite of her using rescue inhalers twice she did not get any relief.  In the ED she was tachypneic and has subjective respiratory distress on evaluation.  Patient was given magnesium sulfate, methylprednisone and nebulized bronchodilators,  She felt improved.  She did not require supplemental oxygen during hospitalization.  Patient  was admitted for further evaluation.  Assessment & Plan:   Active Problems:   Hypertension, benign essential, goal below 140/90   COPD exacerbation (HCC)   COPD with Acute exacerbation (HCC) This could be COPD and asthma exacerbation.   Patient symptomatically improved after treatment with methylprednisolone, magnesium as well as inhaled bronchodilator therapy in the ER.   Chest x-ray no acute infiltrate. Influenza COVID RSV negative. --Scheduled Duonebs  4/15 - Noted to have completed IV steroids on 4/14, has not been continued on system steroids, still having diffuse expiratory wheezes --Resume steroids with Prednisone 40 mg daily --Continue Breo, Mucinex, Tussionex --Continue montelukast. --Continue pantoprazole 40 mg daily. Patient does not require supplemental oxygen. Smoking cessation discussed. Patient does not desire nicotine replacement.   Essential hypertension: Appears patient has not been taking her Hyzaar as well as her amlodipine at  home. --Continue Hyzaar --Restart amlodipine  Depression --Continue Cymbalta  Lactic acidosis: No signs of infection noted, could be due to dehydration, poor PO intake.  Blood cultures negative x 4 days.  Afebrile. No evidence of infection. 4/15 -- pt noted to have been off fluids since 4/12, no repeat lactic acid. Repeat lactic acid today is still elevated at 2.7 --Resume IV fluids --Trend lactic acid    DVT prophylaxis: Lovenox Code Status: Full code Family Communication: No family at bedside Disposition Plan:   Status is: Inpatient Remains inpatient appropriate because:  Admitted for COPD with acute exacerbation    Consultants:  None  Procedures: None  Antimicrobials:  Anti-infectives (From admission, onward)    Start     Dose/Rate Route Frequency Ordered Stop   02/19/24 1045  doxycycline (VIBRA-TABS) tablet 100 mg        100 mg Oral Every 12 hours 02/19/24 0959        Subjective: Pt seen this AM, reports still being very short of breath.  Having frequent coughing fits that take her breath away, takes a long time to recover.  Productive cough but at times hard to cough up phlegm.  No other acute complaints or events reported.    Objective: Vitals:   02/21/24 0556 02/21/24 1040 02/21/24 1435 02/21/24 1603  BP: (!) 169/78 131/73 (!) 161/76   Pulse: 94 (!) 103 92   Resp: 18  18   Temp: 98.6 F (37 C)  98.9 F (37.2 C)   TempSrc: Oral  Oral   SpO2: 98% 100% 100% 97%  Weight:      Height:        Intake/Output Summary (Last 24 hours) at  02/21/2024 1704 Last data filed at 02/21/2024 1544 Gross per 24 hour  Intake 551.54 ml  Output --  Net 551.54 ml   Filed Weights   02/17/24 1549  Weight: 54.4 kg    Examination:  General exam: awake, alert, no acute distress HEENT:moist mucus membranes, hearing grossly normal  Respiratory system: diffuse expiratory wheezes, frequent cough fits, cough is raspy and productive sounding, increased respiratory effort  with accessory muscle use. Cardiovascular system: normal S1/S2, RRR, no JVD, murmurs, rubs, gallops, no pedal edema.   Gastrointestinal system: soft, NT, ND, no HSM felt, +bowel sounds. Central nervous system: A&O x 3. no gross focal neurologic deficits, normal speech Extremities: moves all, no edema, normal tone Skin: dry, intact, normal temperature Psychiatry: normal mood, congruent affect, judgement and insight appear normal'   Data Reviewed: I have personally reviewed following labs and imaging studies  CBC: Recent Labs  Lab 02/17/24 1633 02/18/24 0805 02/21/24 1018  WBC 4.4 2.2* 7.4  HGB 13.9 12.9 13.8  HCT 41.8 40.6 41.8  MCV 91.5 93.5 93.7  PLT 264 274 286   Basic Metabolic Panel: Recent Labs  Lab 02/17/24 1633 02/18/24 0805 02/21/24 1018  NA 133* 133* 134*  K 3.5 3.5 3.1*  CL 99 100 94*  CO2 23 20* 30  GLUCOSE 86 196* 150*  BUN 9 14 13   CREATININE 0.81 0.72 0.61  CALCIUM 8.7* 9.0 8.2*   GFR: Estimated Creatinine Clearance: 53.8 mL/min (by C-G formula based on SCr of 0.61 mg/dL). Liver Function Tests: Recent Labs  Lab 02/17/24 1633  AST 20  ALT 9  ALKPHOS 100  BILITOT 0.9  PROT 7.3  ALBUMIN 3.9   No results for input(s): "LIPASE", "AMYLASE" in the last 168 hours. No results for input(s): "AMMONIA" in the last 168 hours. Coagulation Profile: Recent Labs  Lab 02/18/24 0805  INR 0.9   Cardiac Enzymes: No results for input(s): "CKTOTAL", "CKMB", "CKMBINDEX", "TROPONINI" in the last 168 hours. BNP (last 3 results) No results for input(s): "PROBNP" in the last 8760 hours. HbA1C: No results for input(s): "HGBA1C" in the last 72 hours. CBG: No results for input(s): "GLUCAP" in the last 168 hours. Lipid Profile: No results for input(s): "CHOL", "HDL", "LDLCALC", "TRIG", "CHOLHDL", "LDLDIRECT" in the last 72 hours. Thyroid Function Tests: No results for input(s): "TSH", "T4TOTAL", "FREET4", "T3FREE", "THYROIDAB" in the last 72 hours. Anemia  Panel: No results for input(s): "VITAMINB12", "FOLATE", "FERRITIN", "TIBC", "IRON", "RETICCTPCT" in the last 72 hours. Sepsis Labs: Recent Labs  Lab 02/17/24 1923 02/18/24 0805 02/20/24 0914 02/21/24 1018  LATICACIDVEN 2.6* 4.3* 2.8* 2.7*    Recent Results (from the past 240 hours)  Culture, blood (routine x 2)     Status: None (Preliminary result)   Collection Time: 02/17/24  4:33 PM   Specimen: BLOOD  Result Value Ref Range Status   Specimen Description   Final    BLOOD BLOOD RIGHT FOREARM Performed at [facility], [address] [place], [place] [address]    Special Requests   Final    BOTTLES DRAWN AEROBIC AND ANAEROBIC Blood Culture adequate volume Performed at [facility], [address] [place], [place] [address]    Culture   Final    NO GROWTH 4 DAYS Performed at [facility], [address], [place], [place] [address]    Report Status PENDING  Incomplete  Culture, blood (routine x 2)     Status: None (Preliminary result)   Collection Time: 02/17/24  5:00 PM   Specimen: BLOOD  Result Value Ref Range Status   Specimen Description   Final    BLOOD BLOOD LEFT ARM Performed at [facility], [address], [place], [place] [address]    Special Requests   Final    BOTTLES DRAWN AEROBIC AND ANAEROBIC Blood Culture adequate volume Performed at [facility], [address], [place], [place] [address]    Culture   Final    NO GROWTH 4 DAYS Performed at [facility], [address], [place], [place] [address]    Report Status PENDING  Incomplete  Resp panel by RT-PCR (RSV, Flu A&B, Covid) Anterior Nasal Swab     Status: None   Collection Time: 02/17/24  5:07 PM   Specimen: Anterior Nasal Swab  Result Value Ref Range Status   SARS Coronavirus 2 by RT PCR NEGATIVE NEGATIVE Final    Comment: (NOTE) SARS-CoV-2 target nucleic acids are NOT DETECTED.  The SARS-CoV-2 RNA is  generally detectable in upper respiratory specimens during the acute phase of infection. The lowest concentration of SARS-CoV-2 viral copies this assay can detect is 138 copies/mL. A negative result does not preclude SARS-Cov-2 infection and should not be used as the sole basis for treatment or other patient management decisions. A negative result may occur with  improper specimen collection/handling, submission of specimen other than nasopharyngeal swab, presence of viral mutation(s) within the areas targeted by this assay, and inadequate number of viral copies(<138 copies/mL). A negative result must be combined with clinical observations, patient history, and epidemiological information. The expected result is Negative.  Fact Sheet for Patients:  BloggerCourse.com  Fact Sheet for Healthcare Providers:  SeriousBroker.it  This test is no t yet approved or cleared by the [place]  FDA and  has been authorized for detection and/or diagnosis of SARS-CoV-2 by FDA under an Emergency Use Authorization (EUA). This EUA will remain  in effect (meaning this test can be used) for the duration of the COVID-19 declaration under Section 564(b)(1) of the Act, 21 U.S.C.section 360bbb-3(b)(1), unless the authorization is terminated  or revoked sooner.       Influenza A by PCR NEGATIVE NEGATIVE Final   Influenza B by PCR NEGATIVE NEGATIVE Final    Comment: (NOTE) The Xpert Xpress SARS-CoV-2/FLU/RSV plus assay is intended as an aid in the diagnosis of influenza from Nasopharyngeal swab specimens and should not be used as a sole basis for treatment. Nasal washings and aspirates are unacceptable for Xpert Xpress SARS-CoV-2/FLU/RSV testing.  Fact Sheet for Patients: BloggerCourse.com  Fact Sheet for Healthcare Providers: SeriousBroker.it  This test is not yet approved or cleared by the [place] FDA and has been authorized for detection and/or diagnosis of SARS-CoV-2 by FDA under an Emergency Use Authorization (EUA). This EUA will remain in effect (meaning this test can be used) for the duration of the COVID-19 declaration under Section 564(b)(1) of the Act, 21 U.S.C. section 360bbb-3(b)(1), unless the authorization is terminated or revoked.     Resp Syncytial Virus by PCR NEGATIVE NEGATIVE Final    Comment: (NOTE) Fact Sheet for Patients: BloggerCourse.com  Fact Sheet for Healthcare Providers: SeriousBroker.it  This test is not yet approved or cleared by the [place]  FDA and has been authorized for detection and/or diagnosis of SARS-CoV-2 by FDA under an Emergency Use Authorization (EUA). This EUA will remain in effect (meaning this test can be used) for the duration of the COVID-19 declaration under Section 564(b)(1) of  the Act, 21 U.S.C. section 360bbb-3(b)(1), unless the authorization is terminated or revoked.  Performed at [facility], [address], [place], [place] [address]     Radiology Studies: No results found.  Scheduled Meds:  amLODipine  10 mg Oral Daily   dextromethorphan-guaiFENesin  1 tablet Oral BID   doxycycline  100 mg Oral Q12H   DULoxetine  60 mg Oral Daily   enoxaparin (LOVENOX) injection  40 mg Subcutaneous Q24H   fluticasone furoate-vilanterol  1 puff Inhalation Daily   losartan  50 mg Oral Daily   And   hydrochlorothiazide  12.5 mg Oral Daily   ipratropium-albuterol  3 mL Nebulization Q4H   montelukast  5 mg Oral QHS   pantoprazole  20 mg Oral Daily   potassium chloride  40 mEq Oral Q4H   [START ON 02/22/2024] predniSONE  40 mg Oral Q breakfast   sodium chloride flush  3 mL Intravenous Q12H   Continuous Infusions:  sodium chloride 100 mL/hr at 02/21/24 1544      LOS: 3 days    Time spent: 35 mins    Montey Apa, DO [facility]   If 7PM-7AM, please contact night-coverage

## 2024-02-21 NOTE — Plan of Care (Signed)
  Problem: Education: Goal: Knowledge of General Education information will improve Description: Including pain rating scale, medication(s)/side effects and non-pharmacologic comfort measures Outcome: Progressing   Problem: Activity: Goal: Risk for activity intolerance will decrease Outcome: Progressing   Problem: Elimination: Goal: Will not experience complications related to bowel motility Outcome: Progressing   Problem: Safety: Goal: Ability to remain free from injury will improve Outcome: Progressing   Problem: Skin Integrity: Goal: Risk for impaired skin integrity will decrease Outcome: Progressing   

## 2024-02-22 DIAGNOSIS — J441 Chronic obstructive pulmonary disease with (acute) exacerbation: Secondary | ICD-10-CM | POA: Diagnosis not present

## 2024-02-22 LAB — CULTURE, BLOOD (ROUTINE X 2)
Culture: NO GROWTH
Culture: NO GROWTH
Special Requests: ADEQUATE
Special Requests: ADEQUATE

## 2024-02-22 LAB — BASIC METABOLIC PANEL WITH GFR
Anion gap: 7 (ref 5–15)
BUN: 10 mg/dL (ref 8–23)
CO2: 31 mmol/L (ref 22–32)
Calcium: 8.3 mg/dL — ABNORMAL LOW (ref 8.9–10.3)
Chloride: 96 mmol/L — ABNORMAL LOW (ref 98–111)
Creatinine, Ser: 0.59 mg/dL (ref 0.44–1.00)
GFR, Estimated: >60 mL/min (ref >60)
Glucose, Bld: 105 mg/dL — ABNORMAL HIGH (ref 70–99)
Potassium: 3.8 mmol/L (ref 3.5–5.1)
Sodium: 134 mmol/L — ABNORMAL LOW (ref 135–145)

## 2024-02-22 LAB — MAGNESIUM: Magnesium: 1.8 mg/dL (ref 1.7–2.4)

## 2024-02-22 LAB — LACTIC ACID, PLASMA: Lactic Acid, Venous: 1.5 mmol/L (ref 0.5–1.9)

## 2024-02-22 MED ORDER — DM-GUAIFENESIN ER 30-600 MG PO TB12: 1.0000 | ORAL_TABLET | Freq: Two times a day (BID) | ORAL | 0 refills | Status: DC

## 2024-02-22 MED ORDER — FLUTICASONE PROPIONATE 50 MCG/ACT NA SUSP: 1.0000 | NASAL | Status: DC

## 2024-02-22 MED ORDER — PREDNISONE 20 MG PO TABS: 40.0000 mg | ORAL_TABLET | Freq: Every day | ORAL | 0 refills | Status: AC

## 2024-02-22 MED ORDER — BUDESONIDE-FORMOTEROL FUMARATE 160-4.5 MCG/ACT IN AERO: 2.0000 | INHALATION_SPRAY | Freq: Two times a day (BID) | RESPIRATORY_TRACT | 0 refills | Status: DC

## 2024-02-22 NOTE — Plan of Care (Signed)

## 2024-02-22 NOTE — Discharge Summary (Signed)
 Physician Discharge Summary  [name] BJY:[id number] DOB: 08-12-1951 DOA: 02/17/2024  PCP: [name], MD  Admit date: 02/17/2024 Discharge date: 02/22/2024  Admitted From: Home Disposition: Home  Recommendations for Outpatient Follow-up:  Follow up with PCP in 1 week  Outpatient evaluation and follow-up by pulmonary Follow up in ED if symptoms worsen or new appear   Home Health: No Equipment/Devices: None  Discharge Condition: Stable CODE STATUS: Full Diet recommendation: Heart healthy  Brief/Interim Summary: 73 year old female with history of COPD, essential hypertension, depression, noncompliance presented with worsening shortness of breath.  Patient was admitted for acute respite failure with hypoxia from COPD exacerbation and was treated with IV Solu-Medrol along with nebs.  During the hospitalization, her condition has improved.  She is currently on room air.  She feels okay to go home today.  She will be discharged home today on oral prednisone.  Outpatient follow-up with PCP.  Compliance to medications and follow-up will be very important.  Discharge Diagnoses:   Acute respiratory with hypoxia COPD exacerbation - Chest x-ray showed no infiltrate.  Influenza/COVID/RSV PCR negative.  Treated with IV Solu-Medrol along with labs. -During the hospitalization, her condition has improved.  She is currently on room air.  She feels okay to go home today.  She will be discharged home today on oral prednisone 40 mg daily for 7 days.  Continue home inhaled regimen.  Add Symbicort.  Outpatient follow-up with PCP.  Compliance to medications and follow-up will be very important.  Outpatient evaluation and follow-up with pulmonary  Hypertension - Resume home regimen.  Outpatient follow-up with PCP  Depression - Continue Cymbalta.  Outpatient follow-up with his PCP  Lactic acidosis - Possibly from dehydration and poor oral intake.  Treated with IV fluids.  Resolved.   No evidence of infection.  Hyponatremia - Mild.  Encourage oral intake.  Outpatient follow-up  Hypokalemia -Resolved  Discharge Instructions  Discharge Instructions     Diet - low sodium heart healthy   Complete by: As directed    Increase activity slowly   Complete by: As directed    Pulmonary Visit   Complete by: As directed    COPD   Reason for referral: Other Pulmonary      Allergies as of 02/22/2024       Reactions   Ace Inhibitors Anaphylaxis, Swelling, Other (See Comments)   Throat swelling  (Re: Lisinopril-hydrochlorothiazide) Pt questioned if she is allergic in 2025.        Medication List     TAKE these medications    acetaminophen 325 MG tablet Commonly known as: TYLENOL Take 2 tablets (650 mg total) by mouth every 6 (six) hours as needed for mild pain (or Fever >/= 101).   albuterol (2.5 MG/3ML) 0.083% nebulizer solution Commonly known as: PROVENTIL Take 2.5 mg by nebulization every 4 (four) hours as needed for shortness of breath or wheezing (for flares, when sick).   albuterol 108 (90 Base) MCG/ACT inhaler Commonly known as: VENTOLIN HFA Inhale 2 puffs into the lungs every 6 (six) hours as needed for wheezing or shortness of breath.   amLODipine 10 MG tablet Commonly known as: NORVASC Take 1 tablet (10 mg total) by mouth daily.   budesonide-formoterol 160-4.5 MCG/ACT inhaler Commonly known as: Symbicort Inhale 2 puffs into the lungs 2 (two) times daily.   cetirizine 5 MG tablet Commonly known as: ZYRTEC Take 5 mg by mouth at bedtime as needed for allergies or rhinitis.   dextromethorphan-guaiFENesin 30-600 MG 12hr  tablet Commonly known as: MUCINEX DM Take 1 tablet by mouth 2 (two) times daily.   DULoxetine 60 MG capsule Commonly known as: CYMBALTA Take 1 capsule (60 mg total) by mouth daily.   fluticasone 50 MCG/ACT nasal spray Commonly known as: FLONASE Place 1 spray into both nostrils See admin instructions. Instill 1 spray into  each nostril one to two times a day as needed for rhinitis or allergies   losartan-hydrochlorothiazide 50-12.5 MG tablet Commonly known as: HYZAAR Take 1 tablet by mouth 2 (two) times daily.   metoprolol tartrate 25 MG tablet Commonly known as: LOPRESSOR Take 1 tablet (25 mg total) by mouth 2 (two) times daily.   predniSONE 20 MG tablet Commonly known as: DELTASONE Take 2 tablets (40 mg total) by mouth daily with breakfast for 7 days. Start taking on: February 23, 2024   Spiriva HandiHaler 18 MCG inhalation capsule Generic drug: tiotropium Place 18 mcg into inhaler and inhale daily as needed (for flares- when sick).        Follow-up Information     [name], MD. Schedule an appointment as soon as possible for a visit in 1 week(s).   Specialty: Internal Medicine Contact information: [address] [place] [address] [phone]                Allergies  Allergen Reactions   Ace Inhibitors Anaphylaxis, Swelling and Other (See Comments)    Throat swelling  (Re: Lisinopril-hydrochlorothiazide)  Pt questioned if she is allergic in 2025.    Consultations: None   Procedures/Studies: DG Chest Port 1 View Result Date: 02/17/2024 CLINICAL DATA:  Short of breath and cough EXAM: PORTABLE CHEST 1 VIEW COMPARISON:  None Available. FINDINGS: Normal mediastinum and cardiac silhouette. Normal pulmonary vasculature. No evidence of effusion, infiltrate, or pneumothorax. No acute bony abnormality. Anterior cervical fusion. RIGHT axillary dissection clips IMPRESSION: No acute cardiopulmonary process. Electronically Signed   By: Genevive Bi M.D.   On: 02/17/2024 18:25      Subjective: Patient seen and examined at bedside.  Still coughing and short of breath with some exertion but feels able to go home today.  No fever, vomiting, chest pain reported.  Discharge Exam: Vitals:   02/22/24 0922 02/22/24 1137  BP:    Pulse:    Resp:    Temp:    SpO2: 96% 93%     General: Pt is alert, awake, not in acute distress Cardiovascular: rate controlled, S1/S2 + Respiratory: bilateral decreased breath sounds at bases with scattered crackles and some wheezing Abdominal: Soft, NT, ND, bowel sounds + Extremities: no edema, no cyanosis    The results of significant diagnostics from this hospitalization (including imaging, microbiology, ancillary and laboratory) are listed below for reference.     Microbiology: Recent Results (from the past 240 hours)  Culture, blood (routine x 2)     Status: None   Collection Time: 02/17/24  4:33 PM   Specimen: BLOOD  Result Value Ref Range Status   Specimen Description   Final    BLOOD BLOOD RIGHT FOREARM Performed at [facility], [address], [place], [place] [address]    Special Requests   Final    BOTTLES DRAWN AEROBIC AND ANAEROBIC Blood Culture adequate volume Performed at [facility], [address] [place], [place] [address]    Culture   Final    NO GROWTH 5 DAYS Performed at [facility], [address] [place], [place] [address]  Report Status 02/22/2024 FINAL  Final  Culture, blood (routine x 2)     Status: None   Collection Time: 02/17/24  5:00 PM   Specimen: BLOOD  Result Value Ref Range Status   Specimen Description   Final    BLOOD BLOOD LEFT ARM Performed at [facility], [address] [place], [place] [address]    Special Requests   Final    BOTTLES DRAWN AEROBIC AND ANAEROBIC Blood Culture adequate volume Performed at [facility], [address], [place], [place] [address]    Culture   Final    NO GROWTH 5 DAYS Performed at [facility], [address] [place], [place] [address]    Report Status 02/22/2024 FINAL  Final  Resp panel by RT-PCR (RSV, Flu A&B, Covid) Anterior Nasal Swab     Status: None   Collection Time: 02/17/24  5:07 PM   Specimen: Anterior Nasal Swab  Result Value  Ref Range Status   SARS Coronavirus 2 by RT PCR NEGATIVE NEGATIVE Final    Comment: (NOTE) SARS-CoV-2 target nucleic acids are NOT DETECTED.  The SARS-CoV-2 RNA is generally detectable in upper respiratory specimens during the acute phase of infection. The lowest concentration of SARS-CoV-2 viral copies this assay can detect is 138 copies/mL. A negative result does not preclude SARS-Cov-2 infection and should not be used as the sole basis for treatment or other patient management decisions. A negative result may occur with  improper specimen collection/handling, submission of specimen other than nasopharyngeal swab, presence of viral mutation(s) within the areas targeted by this assay, and inadequate number of viral copies(<138 copies/mL). A negative result must be combined with clinical observations, patient history, and epidemiological information. The expected result is Negative.  Fact Sheet for Patients:  BloggerCourse.com  Fact Sheet for Healthcare Providers:  SeriousBroker.it  This test is no t yet approved or cleared by the [place] FDA and  has been authorized for detection and/or diagnosis of SARS-CoV-2 by FDA under an Emergency Use Authorization (EUA). This EUA will remain  in effect (meaning this test can be used) for the duration of the COVID-19 declaration under Section 564(b)(1) of the Act, 21 U.S.C.section 360bbb-3(b)(1), unless the authorization is terminated  or revoked sooner.       Influenza A by PCR NEGATIVE NEGATIVE Final   Influenza B by PCR NEGATIVE NEGATIVE Final    Comment: (NOTE) The Xpert Xpress SARS-CoV-2/FLU/RSV plus assay is intended as an aid in the diagnosis of influenza from Nasopharyngeal swab specimens and should not be used as a sole basis for treatment. Nasal washings and aspirates are unacceptable for Xpert Xpress SARS-CoV-2/FLU/RSV testing.  Fact Sheet for  Patients: BloggerCourse.com  Fact Sheet for Healthcare Providers: SeriousBroker.it  This test is not yet approved or cleared by the [place] FDA and has been authorized for detection and/or diagnosis of SARS-CoV-2 by FDA under an Emergency Use Authorization (EUA). This EUA will remain in effect (meaning this test can be used) for the duration of the COVID-19 declaration under Section 564(b)(1) of the Act, 21 U.S.C. section 360bbb-3(b)(1), unless the authorization is terminated or revoked.     Resp Syncytial Virus by PCR NEGATIVE NEGATIVE Final    Comment: (NOTE) Fact Sheet for Patients: BloggerCourse.com  Fact Sheet for Healthcare Providers: SeriousBroker.it  This test is not yet approved or cleared by the [place] FDA and has been authorized for detection and/or diagnosis of SARS-CoV-2 by FDA under an Emergency Use  Authorization (EUA). This EUA will remain in effect (meaning this test can be used) for the duration of the COVID-19 declaration under Section 564(b)(1) of the Act, 21 U.S.C. section 360bbb-3(b)(1), unless the authorization is terminated or revoked.  Performed at [facility], [address] [place], [place] [address]      Labs: BNP (last 3 results) Recent Labs    01/12/24 0830 02/17/24 1633  BNP 68.9 59.5   Basic Metabolic Panel: Recent Labs  Lab 02/17/24 1633 02/18/24 0805 02/21/24 1018 02/22/24 0543  NA 133* 133* 134* 134*  K 3.5 3.5 3.1* 3.8  CL 99 100 94* 96*  CO2 23 20* 30 31  GLUCOSE 86 196* 150* 105*  BUN 9 14 13 10   CREATININE 0.81 0.72 0.61 0.59  CALCIUM 8.7* 9.0 8.2* 8.3*  MG  --   --   --  1.8   Liver Function Tests: Recent Labs  Lab 02/17/24 1633  AST 20  ALT 9  ALKPHOS 100  BILITOT 0.9  PROT 7.3  ALBUMIN 3.9   No results for input(s): "LIPASE", "AMYLASE" in the last 168 hours. No results for  input(s): "AMMONIA" in the last 168 hours. CBC: Recent Labs  Lab 02/17/24 1633 02/18/24 0805 02/21/24 1018  WBC 4.4 2.2* 7.4  HGB 13.9 12.9 13.8  HCT 41.8 40.6 41.8  MCV 91.5 93.5 93.7  PLT 264 274 286   Cardiac Enzymes: No results for input(s): "CKTOTAL", "CKMB", "CKMBINDEX", "TROPONINI" in the last 168 hours. BNP: Invalid input(s): "POCBNP" CBG: No results for input(s): "GLUCAP" in the last 168 hours. D-Dimer No results for input(s): "DDIMER" in the last 72 hours. Hgb A1c No results for input(s): "HGBA1C" in the last 72 hours. Lipid Profile No results for input(s): "CHOL", "HDL", "LDLCALC", "TRIG", "CHOLHDL", "LDLDIRECT" in the last 72 hours. Thyroid function studies No results for input(s): "TSH", "T4TOTAL", "T3FREE", "THYROIDAB" in the last 72 hours.  Invalid input(s): "FREET3" Anemia work up No results for input(s): "VITAMINB12", "FOLATE", "FERRITIN", "TIBC", "IRON", "RETICCTPCT" in the last 72 hours. Urinalysis    Component Value Date/Time   COLORURINE STRAW (A) 04/06/2019 1804   APPEARANCEUR CLEAR 04/06/2019 1804   LABSPEC 1.023 04/06/2019 1804   PHURINE 5.0 04/06/2019 1804   GLUCOSEU >=500 (A) 04/06/2019 1804   HGBUR NEGATIVE 04/06/2019 1804   BILIRUBINUR NEGATIVE 04/06/2019 1804   KETONESUR 5 (A) 04/06/2019 1804   PROTEINUR NEGATIVE 04/06/2019 1804   UROBILINOGEN 1.0 02/15/2012 1208   NITRITE NEGATIVE 04/06/2019 1804   LEUKOCYTESUR NEGATIVE 04/06/2019 1804   Sepsis Labs Recent Labs  Lab 02/17/24 1633 02/18/24 0805 02/21/24 1018  WBC 4.4 2.2* 7.4   Microbiology Recent Results (from the past 240 hours)  Culture, blood (routine x 2)     Status: None   Collection Time: 02/17/24  4:33 PM   Specimen: BLOOD  Result Value Ref Range Status   Specimen Description   Final    BLOOD BLOOD RIGHT FOREARM Performed at [facility], [address] [place], [place] [address]    Special Requests   Final    BOTTLES DRAWN AEROBIC AND ANAEROBIC  Blood Culture adequate volume Performed at [facility], [address] [place], [place] [address]    Culture   Final    NO GROWTH 5 DAYS Performed at [facility], [address] [place], [place] [address]    Report Status 02/22/2024 FINAL  Final  Culture, blood (routine x 2)     Status:  None   Collection Time: 02/17/24  5:00 PM   Specimen: BLOOD  Result Value Ref Range Status   Specimen Description   Final    BLOOD BLOOD LEFT ARM Performed at [facility], [address] [place], [place] [address]    Special Requests   Final    BOTTLES DRAWN AEROBIC AND ANAEROBIC Blood Culture adequate volume Performed at [facility], [address] [place], [place] [address]    Culture   Final    NO GROWTH 5 DAYS Performed at [facility], [address], [place], [place] [address]    Report Status 02/22/2024 FINAL  Final  Resp panel by RT-PCR (RSV, Flu A&B, Covid) Anterior Nasal Swab     Status: None   Collection Time: 02/17/24  5:07 PM   Specimen: Anterior Nasal Swab  Result Value Ref Range Status   SARS Coronavirus 2 by RT PCR NEGATIVE NEGATIVE Final    Comment: (NOTE) SARS-CoV-2 target nucleic acids are NOT DETECTED.  The SARS-CoV-2 RNA is generally detectable in upper respiratory specimens during the acute phase of infection. The lowest concentration of SARS-CoV-2 viral copies this assay can detect is 138 copies/mL. A negative result does not preclude SARS-Cov-2 infection and should not be used as the sole basis for treatment or other patient management decisions. A negative result may occur with  improper specimen collection/handling, submission of specimen other than nasopharyngeal swab, presence of viral mutation(s) within the areas targeted by this assay, and inadequate number of viral copies(<138 copies/mL). A negative result must be combined with clinical observations, patient history, and  epidemiological information. The expected result is Negative.  Fact Sheet for Patients:  BloggerCourse.com  Fact Sheet for Healthcare Providers:  SeriousBroker.it  This test is no t yet approved or cleared by the [place]  FDA and  has been authorized for detection and/or diagnosis of SARS-CoV-2 by FDA under an Emergency Use Authorization (EUA). This EUA will remain  in effect (meaning this test can be used) for the duration of the COVID-19 declaration under Section 564(b)(1) of the Act, 21 U.S.C.section 360bbb-3(b)(1), unless the authorization is terminated  or revoked sooner.       Influenza A by PCR NEGATIVE NEGATIVE Final   Influenza B by PCR NEGATIVE NEGATIVE Final    Comment: (NOTE) The Xpert Xpress SARS-CoV-2/FLU/RSV plus assay is intended as an aid in the diagnosis of influenza from Nasopharyngeal swab specimens and should not be used as a sole basis for treatment. Nasal washings and aspirates are unacceptable for Xpert Xpress SARS-CoV-2/FLU/RSV testing.  Fact Sheet for Patients: BloggerCourse.com  Fact Sheet for Healthcare Providers: SeriousBroker.it  This test is not yet approved or cleared by the [place]  FDA and has been authorized for detection and/or diagnosis of SARS-CoV-2 by FDA under an Emergency Use Authorization (EUA). This EUA will remain in effect (meaning this test can be used) for the duration of the COVID-19 declaration under Section 564(b)(1) of the Act, 21 U.S.C. section 360bbb-3(b)(1), unless the authorization is terminated or revoked.     Resp Syncytial Virus by PCR NEGATIVE NEGATIVE Final    Comment: (NOTE) Fact Sheet for Patients: BloggerCourse.com  Fact Sheet for Healthcare Providers: SeriousBroker.it  This test is not yet approved or cleared by the [place]  FDA and has been  authorized for detection and/or diagnosis of SARS-CoV-2 by FDA under an Emergency Use Authorization (EUA). This EUA will remain in effect (meaning this test can be used) for the duration  of the COVID-19 declaration under Section 564(b)(1) of the Act, 21 U.S.C. section 360bbb-3(b)(1), unless the authorization is terminated or revoked.  Performed at [facility], [address], [place], [place] [address]      Time coordinating discharge: 35 minutes  SIGNED:   Audria Leather, MD  [facility] 02/22/2024, 11:57 AM

## 2024-02-22 NOTE — Progress Notes (Signed)
 Mobility Specialist - Progress Note   02/22/24 1137  Oxygen Therapy  SpO2 93 %  O2 Device Room Air  Patient Activity (if Appropriate) Ambulating  Mobility  Activity Ambulated independently in hallway  Level of Assistance Independent  Assistive Device None  Distance Ambulated (ft) 250 ft  Activity Response Tolerated well  Mobility Referral Yes  Mobility visit 1 Mobility  Mobility Specialist Start Time (ACUTE ONLY) 1110  Mobility Specialist Stop Time (ACUTE ONLY) 1134  Mobility Specialist Time Calculation (min) (ACUTE ONLY) 24 min   Nurse requested Mobility Specialist to perform oxygen saturation test with pt which includes removing pt from oxygen both at rest and while ambulating.  Below are the results from that testing.     Patient Saturations on Room Air at Rest = spO2 95%  Patient Saturations on Room Air while Ambulating = sp02 93% .   At end of testing pt left in room on 0  Liters of oxygen.  Reported results to nurse. Pt received in bed and agreeable to mobility. Pt fatigued throughout session. Pt to bed after session with all needs met.     Pre-mobility: 109 HR, 95% SpO2 (RA) During mobility: 142 HR, 93% SpO2 (RA) Post-mobility: 132 HR, 94% SPO2 (RA)  Chief Technology Officer

## 2024-02-23 ENCOUNTER — Telehealth: Payer: Self-pay

## 2024-02-23 NOTE — Transitions of Care (Post Inpatient/ED Visit) (Signed)
   02/23/2024  Name: [name] MRN: [id number] DOB: 23-May-1951  Today's TOC FU Call Status: Today's TOC FU Call Status:: Successful TOC FU Call Completed TOC FU Call Complete Date: 02/23/24 Patient's Name and Date of Birth confirmed.  Transition Care Management Follow-up Telephone Call Date of Discharge: 02/22/24 Discharge Facility: [facility]) Type of Discharge: Inpatient Admission How have you been since you were released from the hospital?: Better (Still wheezing takiing Prednisone today) Any questions or concerns?: No  Items Reviewed: Did you receive and understand the discharge instructions provided?: Yes Medications obtained,verified, and reconciled?: Yes (Medications Reviewed) Any new allergies since your discharge?: No Dietary orders reviewed?: Yes Type of Diet Ordered:: low sodium diet Do you have support at home?: Yes People in Home [RPT]: spouse Name of Support/Comfort Primary Source: [name]  Medications Reviewed Today: Medications Reviewed Today   Medications were not reviewed in this encounter     Home Care and Equipment/Supplies: Were Home Health Services Ordered?: No  Functional Questionnaire: Do you need assistance with bathing/showering or dressing?: No Do you need assistance with meal preparation?: No Do you need assistance with eating?: No Do you have difficulty maintaining continence: No Do you need assistance with getting out of bed/getting out of a chair/moving?: No Do you have difficulty managing or taking your medications?: No  Follow up appointments reviewed: PCP Follow-up appointment confirmed?: No Specialist Hospital Follow-up appointment confirmed?: NA Do you need transportation to your follow-up appointment?: No Do you understand care options if your condition(s) worsen?: Yes-patient verbalized understanding  SDOH Interventions Today    Flowsheet Row Most Recent Value  SDOH Interventions   Food Insecurity Interventions  Intervention Not Indicated  Housing Interventions Intervention Not Indicated  Utilities Interventions Intervention Not Indicated       Goals Addressed             This Visit's Progress    VBCI Transitions of Care (TOC) Care Plan       Problems:  Recent Hospitalization for treatment of COPD Patient wants to stop smoking No Hospital Follow Up Provider appointment  Changed to [facility] hospital follow up February 27, 2024 8:45 am  Goal:  Over the next 30 days, the patient will not experience hospital readmission Follow up with progress on not smoking next week  Interventions:   COPD Interventions: Advised patient to track and manage COPD triggers Decrease or no smoking  Patient Self Care Activities:  Attend all scheduled provider appointments Get adequate rest  Plan:  Telephone follow up appointment with care management team member scheduled for:  March 01, 2024 10:00 am         [place], RN, BSN, CCM [facility], [facility] RN Care Manager Direct Dial: [phone]

## 2024-02-27 ENCOUNTER — Encounter: Admitting: Student

## 2024-02-27 ENCOUNTER — Inpatient Hospital Stay: Admitting: Student

## 2024-03-01 ENCOUNTER — Other Ambulatory Visit: Payer: Self-pay

## 2024-03-01 ENCOUNTER — Telehealth: Payer: Self-pay

## 2024-03-01 NOTE — Transitions of Care (Post Inpatient/ED Visit) (Signed)
  Transition of Care week 2  Visit Note  03/01/2024  Name: [name] MRN: [id number]          DOB: 06-Nov-1951  Situation: Patient enrolled in [facility] 30-day program. Spoke with patient who stated she is doing well and requested to reschedule today's visit. Visit rescheduled for 03/07/24 3pm - TOC RN advised patient she also has visit scheduled with SW earlier that same day.    Background: Patient enrolled in [facility] program related to 02/22/24 discharge from [facility] with shortness of breath.   Initial Transition Care Management Follow-up Telephone Call    Past Medical History:  Diagnosis Date   Acute sinusitis 01/09/2019   Anxiety    Arthritis    "fingers, knees" (08/16/2018)   Asthma    Atopic dermatitis 03/20/2018   Axillary hidradenitis suppurativa 10/13/2018   Cancer of right breast (HCC) 1991   s/p lumpectomy, chemotherapy and radiation therapy in 1991. Mammogram in 2007 was normal.   Cellulitis of buttock, left 12/13/2022   Constipated    h/o   COPD (chronic obstructive pulmonary disease) (HCC)    History of multiple hospital admissions for exercabation    COPD exacerbation (HCC) 01/01/2019   COPD with acute exacerbation (HCC) 01/14/2019   COPD with exacerbation (HCC) 04/06/2009   Qualifier: Diagnosis of  By: Derald Flattery MD, Vijay     Depression    Diarrhea    h/o   Facial cellulitis 08/30/2022   Facial edema 08/31/2022   GERD (gastroesophageal reflux disease)    Grade I diastolic dysfunction 01/12/2024   Headache    "a few times/month" (08/16/2018   Heart murmur 10/05/2011   "first time I ever heard I had one was today"   History of breast cancer 12/01/2012   Pt with h/o breast CA s/p lumpectomy with chemo/radiation in 1991. Pt mammogram 2011 was unremarkable. Had CT chest 10/2012 in ED for SOB and showed spiculated nodule with lymph node. 12/04/12: Birads 2; repeat diagnostic mammogram in 1 year.     Hyperlipidemia    Hypertension    Lower extremity edema  09/21/2018   Obesity    Personal history of chemotherapy    Personal history of radiation therapy    Pneumonia    "couple times in the last 10-15 yrs" (08/16/2018)   QT prolongation 08/08/2014   Seasonal allergies 02/25/2017   Shortness of breath 10/05/2011   "at rest; lying down; w/exertion"   Sigmoid diverticulitis 80/2008   Tobacco abuse    Type 2 diabetes mellitus (HCC) 05/14/2009   Type II diabetes mellitus (HCC)     Assessment:No assessments completed related patient request to reschedule Patient Reported Symptoms:Patient states she is doing well  There were no vitals filed for this visit.  Medications Reviewed Today TOC RN unable to review medications today - patient requested to reschedule visit  Medications were not reviewed in this encounter     Recommendation:   Patient to contact PCP or TOC RN with any new or worsening symptoms  Follow Up Plan:   Telephone follow up appointment date/time:  03/07/24 2pm  Tonia Frankel RN, CCM [place]  VBCI-Population Health RN Care Manager [phone]

## 2024-03-07 ENCOUNTER — Encounter: Payer: Self-pay | Admitting: Licensed Clinical Social Worker

## 2024-03-07 ENCOUNTER — Telehealth: Payer: Self-pay

## 2024-03-07 ENCOUNTER — Encounter: Payer: Medicare Other | Admitting: Licensed Clinical Social Worker

## 2024-03-07 NOTE — Transitions of Care (Post Inpatient/ED Visit) (Signed)
  Transition of Care week 3  Visit Note  03/07/2024  Name: [name] MRN: [id number]          DOB: 09-28-1951  Situation: Patient enrolled in [facility] 30-day program. Visit completed with patient by telephone.   Background: patient is enrolled in [facility] program related to hospital discharge from [facility] 02/22/24 related to shortness of breath.  Patient had a visit scheduled with this TOC RN 03/01/24 but asked to reschedule for 03/07/24. During call today, 03/07/24 patient asked to be rescheduled to 03/15/24 1pm and states her husband will be there for this call.   Initial Transition Care Management Follow-up Telephone Call    Past Medical History:  Diagnosis Date   Acute sinusitis 01/09/2019   Anxiety    Arthritis    "fingers, knees" (08/16/2018)   Asthma    Atopic dermatitis 03/20/2018   Axillary hidradenitis suppurativa 10/13/2018   Cancer of right breast (HCC) 1991   s/p lumpectomy, chemotherapy and radiation therapy in 1991. Mammogram in 2007 was normal.   Cellulitis of buttock, left 12/13/2022   Constipated    h/o   COPD (chronic obstructive pulmonary disease) (HCC)    History of multiple hospital admissions for exercabation    COPD exacerbation (HCC) 01/01/2019   COPD with acute exacerbation (HCC) 01/14/2019   COPD with exacerbation (HCC) 04/06/2009   Qualifier: Diagnosis of  By: Derald Flattery MD, Vijay     Depression    Diarrhea    h/o   Facial cellulitis 08/30/2022   Facial edema 08/31/2022   GERD (gastroesophageal reflux disease)    Grade I diastolic dysfunction 01/12/2024   Headache    "a few times/month" (08/16/2018   Heart murmur 10/05/2011   "first time I ever heard I had one was today"   History of breast cancer 12/01/2012   Pt with h/o breast CA s/p lumpectomy with chemo/radiation in 1991. Pt mammogram 2011 was unremarkable. Had CT chest 10/2012 in ED for SOB and showed spiculated nodule with lymph node. 12/04/12: Birads 2; repeat diagnostic mammogram in 1 year.      Hyperlipidemia    Hypertension    Lower extremity edema 09/21/2018   Obesity    Personal history of chemotherapy    Personal history of radiation therapy    Pneumonia    "couple times in the last 10-15 yrs" (08/16/2018)   QT prolongation 08/08/2014   Seasonal allergies 02/25/2017   Shortness of breath 10/05/2011   "at rest; lying down; w/exertion"   Sigmoid diverticulitis 80/2008   Tobacco abuse    Type 2 diabetes mellitus (HCC) 05/14/2009   Type II diabetes mellitus (HCC)     Assessment: Patient Reported Symptoms:Unable to complete assessments related patient requested to reschedule call. Unable to review medications,  There were no vitals filed for this visit.  Medications Reviewed Today   Medications were not reviewed in this encounter     Recommendation:   PCP Follow-up - [facility] RN discussed with patient importance of scheduled PCP appointment as it appears she was a no show to 02/27/24 scheduled appointment - [facility] RN offered to assist and patient states she will call and schedule a time when her husband can take her  Follow Up Plan:   Telephone follow up appointment date/time:  03/15/24 1pm  Tonia Frankel RN, CCM [place]  VBCI-Population Health RN Care Manager [phone]

## 2024-03-13 ENCOUNTER — Other Ambulatory Visit: Payer: Self-pay | Admitting: Student

## 2024-03-13 ENCOUNTER — Ambulatory Visit: Payer: Self-pay

## 2024-03-13 NOTE — Telephone Encounter (Signed)
 Pt calling in regards to the status of her medication: albuterol  (PROVENTIL ) (2.5 MG/3ML) 0.083% nebulizer solution   Please advise

## 2024-03-13 NOTE — Telephone Encounter (Signed)
 Chief Complaint: anxiety Symptoms: anxiety Frequency: today Pertinent Negatives: Patient denies thought of SI/SH Disposition: [] ED /[] Urgent Care (no appt availability in office) / [] Appointment(In office/virtual)/ []  [place] [facility]/ [] Home Care/ [] Refused Recommended Disposition /[] [place] [facility]/ [x]  Follow-up with PCP Additional Notes: pt states that she called the pharmacy to get a refill on her nebulizer solution and she is a little anxious. Pt states that she only has one vial left and that makes her a little anxious. States she doesn't like to be without it.   Copied from CRM [id number]. Topic: Clinical - Red Word Triage >> Mar 13, 2024 10:58 AM Corin V wrote: [facility] that prompted transfer to Nurse Triage: Patient is out of her albuterol  inhaler refills and is having severe anxiety about not having the refill ready. She was unaware she was on her last vial. Reason for Disposition  MILD anxiety symptoms (e.g., Anxiety symptoms are mild and intermittent; symptoms do not interfere with daily activities)  Answer Assessment - Initial Assessment Questions 1. CONCERN: "Did anything happen that prompted you to call today?"      Running low on neb solution 2. ANXIETY SYMPTOMS: "Can you describe how you (your loved one; patient) have been feeling?" (e.g., tense, restless, panicky, anxious, keyed up, overwhelmed, sense of impending doom).      panicky 3. ONSET: "How long have you been feeling this way?" (e.g., hours, days, weeks)     Just today 4. SEVERITY: "How would you rate the level of anxiety?" (e.g., 0 - 10; or mild, moderate, severe).     mild 5. FUNCTIONAL IMPAIRMENT: "How have these feelings affected your ability to do daily activities?" "Have you had more difficulty than usual doing your normal daily activities?" (e.g., getting better, same, worse; self-care, school, work, interactions)     no 6. HISTORY: "Have you felt this way before?" "Have you ever been diagnosed  with an anxiety problem in the past?" (e.g., generalized anxiety disorder, panic attacks, PTSD). If Yes, ask: "How was this problem treated?" (e.g., medicines, counseling, etc.)     no 7. RISK OF HARM - SUICIDAL IDEATION: "Do you ever have thoughts of hurting or killing yourself?" If Yes, ask:  "Do you have these feelings now?" "Do you have a plan on how you would do this?"     no 8. TREATMENT:  "What has been done so far to treat this anxiety?" (e.g., medicines, relaxation strategies). "What has helped?"     Relaxation  9. TREATMENT - THERAPIST: "Do you have a counselor or therapist? Name?"     no 10. POTENTIAL TRIGGERS: "Do you drink caffeinated beverages (e.g., coffee, colas, teas), and how much daily?" "Do you drink alcohol  or use any drugs?" "Have you started any new medicines recently?"       no 11. PATIENT SUPPORT: "Who is with you now?" "Who do you live with?" "Do you have family or friends who you can talk to?"        yes 12. OTHER SYMPTOMS: "Do you have any other symptoms?" (e.g., feeling depressed, trouble concentrating, trouble sleeping, trouble breathing, palpitations or fast heartbeat, chest pain, sweating, nausea, or diarrhea)       No  Protocols used: Anxiety and Panic Attack-A-AH

## 2024-03-13 NOTE — Telephone Encounter (Signed)
 Refill request for Albuterol  neb has already been requested and sent to PCP.

## 2024-03-13 NOTE — Telephone Encounter (Signed)
 Copied from CRM [id number]. Topic: Clinical - Medication Refill >> Mar 13, 2024 10:56 AM Corin V wrote: Most Recent Primary Care Visit:  Provider: GOODWIN, JOSEPH  Department: [facility]  Visit Type: TELEPHONE OFFICE VISIT  Date: 01/10/2024  Medication: albuterol  (PROVENTIL ) (2.5 MG/3ML) 0.083% nebulizer solution  Has the patient contacted their pharmacy? Yes (Agent: If no, request that the patient contact the pharmacy for the refill. If patient does not wish to contact the pharmacy document the reason why and proceed with request.) (Agent: If yes, when and what did the pharmacy advise?)  Is this the correct pharmacy for this prescription? Yes If no, delete pharmacy and type the correct one.  This is the patient's preferred pharmacy:  [facility] [address], [place] - [address] [place] [address] Phone: [phone] Fax: [phone]    Has the prescription been filled recently? No  Is the patient out of the medication? Yes  Has the patient been seen for an appointment in the last year OR does the patient have an upcoming appointment? Yes  Can we respond through MyChart? Yes  Agent: Please be advised that Rx refills may take up to 3 business days. We ask that you follow-up with your pharmacy.

## 2024-03-13 NOTE — Telephone Encounter (Signed)
 Pt was called / informed of refill; pt's very appreciative.

## 2024-03-15 ENCOUNTER — Other Ambulatory Visit: Payer: Self-pay

## 2024-03-15 NOTE — Transitions of Care (Post Inpatient/ED Visit) (Signed)
 Transition of Care week 3  Visit Note  03/15/2024  Name: [name] MRN: [id number]          DOB: Apr 06, 1951  Situation: Patient enrolled in [facility] 30-day program. Visit completed with patient by telephone.   Background: Patient is enrolled in [facility] program related to hospital discharge from [facility] 02/22/24 related to shortness of breath.  TOC RN attempted to review medications and patient stated she does not take her medications as directed and misses doses and has expired bottles in the home. Patient admitted to not taking any inhaler except albuterol  - states she is not taking Spiriva  or Symbicort . TOC RN attempted to connect with patient's pharmacy that was closed for lunch break, and then reached out via chat to Katrine Parody MD who was made aware and call to PCP office and appointment was scheduled 03/19/24 at 8:45am. [facility] RN stressed importance of keeping appointment and taking all (including expired bottles) to this appointment so MD can review medications and get patient on correct medications - Patient states she would like to be more healthy and states she has cut back on smoking. Patient agreed to follow up call by [facility] RN 03/22/24 10am  Initial Transition Care Management Follow-up Telephone Call    Past Medical History:  Diagnosis Date   Acute sinusitis 01/09/2019   Anxiety    Arthritis    "fingers, knees" (08/16/2018)   Asthma    Atopic dermatitis 03/20/2018   Axillary hidradenitis suppurativa 10/13/2018   Cancer of right breast (HCC) 1991   s/p lumpectomy, chemotherapy and radiation therapy in 1991. Mammogram in 2007 was normal.   Cellulitis of buttock, left 12/13/2022   Constipated    h/o   COPD (chronic obstructive pulmonary disease) (HCC)    History of multiple hospital admissions for exercabation    COPD exacerbation (HCC) 01/01/2019   COPD with acute exacerbation (HCC) 01/14/2019   COPD with exacerbation (HCC) 04/06/2009   Qualifier: Diagnosis of  By: Derald Flattery  MD, Vijay     Depression    Diarrhea    h/o   Facial cellulitis 08/30/2022   Facial edema 08/31/2022   GERD (gastroesophageal reflux disease)    Grade I diastolic dysfunction 01/12/2024   Headache    "a few times/month" (08/16/2018   Heart murmur 10/05/2011   "first time I ever heard I had one was today"   History of breast cancer 12/01/2012   Pt with h/o breast CA s/p lumpectomy with chemo/radiation in 1991. Pt mammogram 2011 was unremarkable. Had CT chest 10/2012 in ED for SOB and showed spiculated nodule with lymph node. 12/04/12: Birads 2; repeat diagnostic mammogram in 1 year.     Hyperlipidemia    Hypertension    Lower extremity edema 09/21/2018   Obesity    Personal history of chemotherapy    Personal history of radiation therapy    Pneumonia    "couple times in the last 10-15 yrs" (08/16/2018)   QT prolongation 08/08/2014   Seasonal allergies 02/25/2017   Shortness of breath 10/05/2011   "at rest; lying down; w/exertion"   Sigmoid diverticulitis 80/2008   Tobacco abuse    Type 2 diabetes mellitus (HCC) 05/14/2009   Type II diabetes mellitus (HCC)     Assessment: Patient Reported Symptoms: Patient denies symptoms - unable to complete full assessment as TOC RN was addressing medicatioons  There were no vitals filed for this visit.  Medications Reviewed Today     Reviewed by Sharmaine Dearth, RN (  Registered Nurse) on 03/15/24 at 1334  Med List Status: <None>   Medication Order Taking? Sig Documenting Provider Last Dose Status Informant  acetaminophen  (TYLENOL ) 325 MG tablet [id number] Yes Take 2 tablets (650 mg total) by mouth every 6 (six) hours as needed for mild pain (or Fever >/= 101). Joette Mustard, MD Taking Active Self, Pharmacy Records  albuterol  (PROVENTIL ) (2.5 MG/3ML) 0.083% nebulizer solution [id number] Yes INHALE 1 VIAL VIA NEBULIZER EVERY 4 HOURS AS NEEDED FOR SHORTNESS OF BREATH OR WHEEZING Gomez-Caraballo, Maria, MD Taking Active   albuterol  (VENTOLIN   HFA) 108 (90 Base) MCG/ACT inhaler [id number] Yes Inhale 2 puffs into the lungs every 6 (six) hours as needed for wheezing or shortness of breath. Cleven Dallas, DO Taking Active Self, Pharmacy Records  amLODipine  (NORVASC ) 10 MG tablet [id number]  Take 1 tablet (10 mg total) by mouth daily. Gomez-Caraballo, Maria, MD  Expired 02/18/24 2359 Self, Pharmacy Records  budesonide -formoterol  (SYMBICORT ) 160-4.5 MCG/ACT inhaler [id number] No Inhale 2 puffs into the lungs 2 (two) times daily.  Patient not taking: Reported on 03/15/2024   Audria Leather, MD Not Taking Active   cetirizine  (ZYRTEC ) 5 MG tablet [id number] No Take 5 mg by mouth at bedtime as needed for allergies or rhinitis.  Patient not taking: Reported on 02/23/2024   [provider] Not Taking Active Self, Pharmacy Records  dextromethorphan -guaiFENesin  (MUCINEX  DM) 30-600 MG 12hr tablet [id number] No Take 1 tablet by mouth 2 (two) times daily.  Patient not taking: Reported on 03/15/2024   Audria Leather, MD Not Taking Active   DULoxetine  (CYMBALTA ) 60 MG capsule [id number]  Take 1 capsule (60 mg total) by mouth daily. Allana Arabia, MD  Expired 02/18/24 2359 Self, Pharmacy Records  fluticasone  (FLONASE ) 50 MCG/ACT nasal spray [id number] No Place 1 spray into both nostrils See admin instructions. Instill 1 spray into each nostril one to two times a day as needed for rhinitis or allergies  Patient not taking: Reported on 03/15/2024   Audria Leather, MD Not Taking Active   losartan -hydrochlorothiazide  (HYZAAR) 50-12.5 MG tablet [id number] No Take 1 tablet by mouth 2 (two) times daily.  Patient not taking: Reported on 03/15/2024   [provider] Not Taking Active Self, Pharmacy Records  metoprolol  tartrate (LOPRESSOR ) 25 MG tablet [id number]  Take 1 tablet (25 mg total) by mouth 2 (two) times daily. Cathey Clunes, MD  Expired 02/18/24 2359 Self, Pharmacy Records           Med Note [facility], Lavonia Powers   Thu Jan 12, 2024  4:50 PM) Not  filled in over a year, but presented at bedside as current (bottle was almost full)  tiotropium (SPIRIVA  HANDIHALER) 18 MCG inhalation capsule [id number] No Place 18 mcg into inhaler and inhale daily as needed (for flares- when sick).  Patient not taking: Reported on 02/18/2024   [provider] Not Taking Active Self, Pharmacy Records            Recommendation:   PCP Follow-up  Follow Up Plan:   Telephone follow up appointment date/time:  03/22/24 10am  Tonia Frankel RN, CCM [place]  VBCI-Population Health RN Care Manager [phone]

## 2024-03-18 ENCOUNTER — Encounter: Payer: Self-pay | Admitting: Internal Medicine

## 2024-03-18 NOTE — Progress Notes (Unsigned)
 CC: follow up appointment  HPI:  [name] is a 73 y.o. with medical history of HTN, HLD, DMII, COPD, allergic rhinitis presenting to [facility] for a follow up appointment. Pt with hospitalization in 02/2024 for COPD exacerbation.   Please see problem-based list for further details, assessments, and plans.  Past Medical History:  Diagnosis Date   Acute sinusitis 01/09/2019   Acute sinusitis 01/09/2019   Anxiety    Arthritis    "fingers, knees" (08/16/2018)   Asthma    Atopic dermatitis 03/20/2018   Axillary hidradenitis suppurativa 10/13/2018   Cancer of right breast (HCC) 1991   s/p lumpectomy, chemotherapy and radiation therapy in 1991. Mammogram in 2007 was normal.   Cellulitis of buttock, left 12/13/2022   Constipated    h/o   COPD (chronic obstructive pulmonary disease) (HCC)    History of multiple hospital admissions for exercabation    COPD exacerbation (HCC) 01/01/2019   COPD exacerbation (HCC) 02/17/2024   COPD with acute exacerbation (HCC) 01/14/2019   COPD with exacerbation (HCC) 04/06/2009   Qualifier: Diagnosis of  By: Derald Flattery MD, Vijay     Depression    Diarrhea    h/o   Facial cellulitis 08/30/2022   Facial edema 08/31/2022   GERD (gastroesophageal reflux disease)    Grade I diastolic dysfunction 01/12/2024   Headache    "a few times/month" (08/16/2018   Heart murmur 10/05/2011   "first time I ever heard I had one was today"   History of breast cancer 12/01/2012   Pt with h/o breast CA s/p lumpectomy with chemo/radiation in 1991. Pt mammogram 2011 was unremarkable. Had CT chest 10/2012 in ED for SOB and showed spiculated nodule with lymph node. 12/04/12: Birads 2; repeat diagnostic mammogram in 1 year.     Hyperlipidemia    Hypertension    Lower extremity edema 09/21/2018   Obesity    Personal history of chemotherapy    Personal history of radiation therapy    Pneumonia    "couple times in the last 10-15 yrs" (08/16/2018)   QT prolongation  08/08/2014   Seasonal allergies 02/25/2017   Shortness of breath 10/05/2011   "at rest; lying down; w/exertion"   Sigmoid diverticulitis 80/2008   Tobacco abuse    Type 2 diabetes mellitus (HCC) 05/14/2009   Type II diabetes mellitus (HCC)      Current Outpatient Medications (Cardiovascular):    amLODipine  (NORVASC ) 10 MG tablet, Take 1 tablet (10 mg total) by mouth daily.   losartan -hydrochlorothiazide  (HYZAAR) 50-12.5 MG tablet, Take 1 tablet by mouth 2 (two) times daily.   metoprolol  tartrate (LOPRESSOR ) 25 MG tablet, Take 1 tablet (25 mg total) by mouth 2 (two) times daily.  Current Outpatient Medications (Respiratory):    Fluticasone -Umeclidin-Vilant (TRELEGY ELLIPTA ) 100-62.5-25 MCG/ACT AEPB, Inhale 1 puff into the lungs daily.   albuterol  (PROVENTIL ) (2.5 MG/3ML) 0.083% nebulizer solution, INHALE 1 VIAL VIA NEBULIZER EVERY 4 HOURS AS NEEDED FOR SHORTNESS OF BREATH OR WHEEZING   albuterol  (VENTOLIN  HFA) 108 (90 Base) MCG/ACT inhaler, Inhale 2 puffs into the lungs every 6 (six) hours as needed for wheezing or shortness of breath.  Current Outpatient Medications (Analgesics):    acetaminophen  (TYLENOL ) 325 MG tablet, Take 2 tablets (650 mg total) by mouth every 6 (six) hours as needed for mild pain (or Fever >/= 101).    Review of Systems:  Review of system negative unless stated in the problem list or HPI.    Physical Exam:  Vitals:  03/19/24 1016 03/19/24 1106  BP: (!) 182/84 (!) 197/85  Pulse: 91 82  Temp: 100 F (37.8 C)   TempSrc: Oral   SpO2: 100%   Weight: 157 lb 9.6 oz (71.5 kg)   Height: 5\' 5"  (1.651 m)    Physical Exam General: NAD HENT: NCAT Lungs: CTAB, no wheeze, rhonchi or rales.  Cardiovascular: Normal heart sounds, no r/m/g, 2+ pulses in all extremities. No LE edema Abdomen: No TTP, normal bowel sounds MSK: No asymmetry or muscle atrophy. Palpable ridged nodule appreciated at 12 o'clock position of right breast. No TTP of this, no other  abnormalities noted (Chaperone Ms. Christin Coy).   Skin: no lesions noted on exposed skin Neuro: Alert and oriented x4. CN grossly intact Psych: Normal mood and normal affect   Assessment & Plan:   Hypertension associated with chronic kidney disease due to type 2 diabetes mellitus (HCC) Pt with HTN currently uncontrolled. She is asymptomatic. She is unaware of her regimen and states she is taking some medications but not sure what they are. Dispense hx shows no dispenses in 2024. Her regimen is amlodipine  10 mg, losartan -hydrochlorothiazide  (HYZAAR) 50-12.5 MG tablet, BID, metoprolol  tartrate 25 MG tablet BID. Normal renal fxn last month. She would like all the medication she needs to be taking to be sent to the pharmacy. Will start previous medications. Will have her follow up in 2 weeks to make sure pt is taking all of her medications.   Dyslipidemia associated with type 2 diabetes mellitus (HCC) Pt currently not on statin. It appears pt was started on simvastatin  previously. Will repeat lipid panel and start statin based on that. Likely will need high intensity given her hx of DMII and uncontrolled HTN along with current tobacco use. ASCVD using previous values is 30 %.   COPD (chronic obstructive pulmonary disease) (HCC) Pt was recently admitted for COPD exacerbation. Feels her breathing is at baseline currently. Home regimen is Treglegy and albuterol  as needed. Pt reports using the albuterol  regularly and Trelegy as needed. Advised to use trelegy every day and to use albuterol  as needed.   Breast nodule Pt concerned with breast nodule that has been present over a year. She becomes emotional talking about this. On exam, small area at 12 oclock that feels like ridges is appreciated. No TTP. A diagnostic MM and ultrasound was obtained last year that showed calcifications was seen and otherwise a benign study. Recommendation for screening MM in one year was given. Will order bilateral screening MM.  Reassured pt of previous benign results and unchanged nodule.   Tobacco use disorder Smoking 1/2 ppd. Counseled on smoking cessation. Pt states she wants to quit "cold [place]" like her husband and does not want to pursue pharmacotherapy at this time.   Healthcare maintenance Lipid panel, ACR, MM done this visit.  Will discuss others at follow up.  Will discuss lung cancer screening benefits vs risk at next visit. More important is her tobacco cessation.    See Encounters Tab for problem based charting.  Patient Discussed with Dr. Starling Eck, MD Tommas Fragmin. [facility] Internal Medicine Residency, PGY-3

## 2024-03-19 ENCOUNTER — Ambulatory Visit (INDEPENDENT_AMBULATORY_CARE_PROVIDER_SITE_OTHER): Admitting: Internal Medicine

## 2024-03-19 ENCOUNTER — Encounter: Admitting: Internal Medicine

## 2024-03-19 ENCOUNTER — Other Ambulatory Visit: Payer: Self-pay

## 2024-03-19 VITALS — BP 197/85 | HR 82 | Temp 100.0°F | Ht 65.0 in | Wt 157.6 lb

## 2024-03-19 DIAGNOSIS — E1169 Type 2 diabetes mellitus with other specified complication: Secondary | ICD-10-CM

## 2024-03-19 DIAGNOSIS — F17200 Nicotine dependence, unspecified, uncomplicated: Secondary | ICD-10-CM

## 2024-03-19 DIAGNOSIS — J449 Chronic obstructive pulmonary disease, unspecified: Secondary | ICD-10-CM | POA: Diagnosis not present

## 2024-03-19 DIAGNOSIS — E119 Type 2 diabetes mellitus without complications: Secondary | ICD-10-CM

## 2024-03-19 DIAGNOSIS — E1122 Type 2 diabetes mellitus with diabetic chronic kidney disease: Secondary | ICD-10-CM | POA: Diagnosis not present

## 2024-03-19 DIAGNOSIS — E785 Hyperlipidemia, unspecified: Secondary | ICD-10-CM

## 2024-03-19 DIAGNOSIS — N63 Unspecified lump in unspecified breast: Secondary | ICD-10-CM | POA: Diagnosis not present

## 2024-03-19 DIAGNOSIS — F17210 Nicotine dependence, cigarettes, uncomplicated: Secondary | ICD-10-CM

## 2024-03-19 DIAGNOSIS — Z0000 Encounter for general adult medical examination without abnormal findings: Secondary | ICD-10-CM

## 2024-03-19 DIAGNOSIS — I129 Hypertensive chronic kidney disease with stage 1 through stage 4 chronic kidney disease, or unspecified chronic kidney disease: Secondary | ICD-10-CM | POA: Diagnosis not present

## 2024-03-19 DIAGNOSIS — I10 Essential (primary) hypertension: Secondary | ICD-10-CM | POA: Diagnosis not present

## 2024-03-19 DIAGNOSIS — E118 Type 2 diabetes mellitus with unspecified complications: Secondary | ICD-10-CM

## 2024-03-19 DIAGNOSIS — Z1231 Encounter for screening mammogram for malignant neoplasm of breast: Secondary | ICD-10-CM

## 2024-03-19 LAB — POCT GLYCOSYLATED HEMOGLOBIN (HGB A1C): Hemoglobin A1C: 5.4 % (ref 4.0–5.6)

## 2024-03-19 LAB — GLUCOSE, CAPILLARY: Glucose-Capillary: 97 mg/dL (ref 70–99)

## 2024-03-19 MED ORDER — LOSARTAN POTASSIUM-HCTZ 50-12.5 MG PO TABS: 1.0000 | ORAL_TABLET | Freq: Two times a day (BID) | ORAL | 0 refills | Status: DC

## 2024-03-19 MED ORDER — AMLODIPINE BESYLATE 10 MG PO TABS: 10.0000 mg | ORAL_TABLET | Freq: Every day | ORAL | 3 refills | Status: AC

## 2024-03-19 MED ORDER — TRELEGY ELLIPTA 100-62.5-25 MCG/ACT IN AEPB: 1.0000 | INHALATION_SPRAY | Freq: Every day | RESPIRATORY_TRACT | 0 refills | Status: DC

## 2024-03-19 MED ORDER — METOPROLOL TARTRATE 25 MG PO TABS: 25.0000 mg | ORAL_TABLET | Freq: Two times a day (BID) | ORAL | 3 refills | Status: DC

## 2024-03-19 NOTE — Telephone Encounter (Signed)
 Pharmacy requesting a 90 day supply

## 2024-03-19 NOTE — Patient Instructions (Signed)
 [name], it was a pleasure seeing you today! You endorsed feeling well today. Below are some of the things we talked about this visit. We look forward to seeing you in the follow up appointment!  Today we discussed: Please take all of your medicines. I have resent them to the pharmacy. Do not take any medicines that you have at home.   BRING ALL MEDICINES TO YOUR NEXT APPOINTMENT.   Use the trelegy inhaler everyday and use the albuterol  as needed.   We are going to order some lab work today and I will call you with the results.   I have ordered the following labs today:   Lab Orders         Glucose, capillary         BMP8+Anion Gap         Lipid Profile         Microalbumin / Creatinine Urine Ratio         POC Hbg A1C       Referrals ordered today:   Referral Orders  No referral(s) requested today     I have ordered the following medication/changed the following medications:   Stop the following medications: Medications Discontinued During This Encounter  Medication Reason   tiotropium (SPIRIVA  HANDIHALER) 18 MCG inhalation capsule Patient has not taken in last 30 days   budesonide -formoterol  (SYMBICORT ) 160-4.5 MCG/ACT inhaler Patient has not taken in last 30 days   cetirizine  (ZYRTEC ) 5 MG tablet Patient has not taken in last 30 days   dextromethorphan -guaiFENesin  (MUCINEX  DM) 30-600 MG 12hr tablet Patient has not taken in last 30 days   DULoxetine  (CYMBALTA ) 60 MG capsule Patient has not taken in last 30 days   fluticasone  (FLONASE ) 50 MCG/ACT nasal spray Patient has not taken in last 30 days   amLODipine  (NORVASC ) 10 MG tablet Reorder   metoprolol  tartrate (LOPRESSOR ) 25 MG tablet Reorder   losartan -hydrochlorothiazide  (HYZAAR) 50-12.5 MG tablet Reorder     Start the following medications: Meds ordered this encounter  Medications   amLODipine  (NORVASC ) 10 MG tablet    Sig: Take 1 tablet (10 mg total) by mouth daily.    Dispense:  90 tablet     Refill:  3   metoprolol  tartrate (LOPRESSOR ) 25 MG tablet    Sig: Take 1 tablet (25 mg total) by mouth 2 (two) times daily.    Dispense:  180 tablet    Refill:  3   losartan -hydrochlorothiazide  (HYZAAR) 50-12.5 MG tablet    Sig: Take 1 tablet by mouth 2 (two) times daily.    Dispense:  60 tablet    Refill:  0   Fluticasone -Umeclidin-Vilant (TRELEGY ELLIPTA ) 100-62.5-25 MCG/ACT AEPB    Sig: Inhale 1 puff into the lungs daily.    Dispense:  60 each    Refill:  0     Follow-up: 2 week follow up with Dr. Meredeth Stallion   Please make sure to arrive 15 minutes prior to your next appointment. If you arrive late, you may be asked to reschedule.   We look forward to seeing you next time. Please call our clinic at [phone] if you have any questions or concerns. The best time to call is Monday-Friday from 9am-4pm, but there is someone available 24/7. If after hours or the weekend, call the main hospital number and ask for the Internal Medicine Resident On-Call. If you need medication refills, please notify your pharmacy one week in advance and they will send us  a request.  Thank you for letting us  take part in your care. Wishing you the best!  Thank you, Jackolyn Masker, MD

## 2024-03-20 LAB — MICROALBUMIN / CREATININE URINE RATIO
Creatinine, Urine: 98.2 mg/dL
Microalb/Creat Ratio: 7 mg/g{creat} (ref 0–29)
Microalbumin, Urine: 6.6 ug/mL

## 2024-03-20 MED ORDER — LOSARTAN POTASSIUM-HCTZ 50-12.5 MG PO TABS: 1.0000 | ORAL_TABLET | Freq: Two times a day (BID) | ORAL | 3 refills | Status: AC

## 2024-03-20 NOTE — Assessment & Plan Note (Signed)
 Pt currently not on statin. It appears pt was started on simvastatin  previously. Will repeat lipid panel and start statin based on that. Likely will need high intensity given her hx of DMII and uncontrolled HTN along with current tobacco use. ASCVD using previous values is 30 %.

## 2024-03-20 NOTE — Assessment & Plan Note (Signed)
 Lipid panel, ACR, MM done this visit.  Will discuss others at follow up.  Will discuss lung cancer screening benefits vs risk at next visit. More important is her tobacco cessation.

## 2024-03-20 NOTE — Assessment & Plan Note (Signed)
 Pt with HTN currently uncontrolled. She is asymptomatic. She is unaware of her regimen and states she is taking some medications but not sure what they are. Dispense hx shows no dispenses in 2024. Her regimen is amlodipine  10 mg, losartan -hydrochlorothiazide  (HYZAAR) 50-12.5 MG tablet, BID, metoprolol  tartrate 25 MG tablet BID. Normal renal fxn last month. She would like all the medication she needs to be taking to be sent to the pharmacy. Will start previous medications. Will have her follow up in 2 weeks to make sure pt is taking all of her medications.

## 2024-03-20 NOTE — Assessment & Plan Note (Addendum)
 Smoking 1/2 ppd. Counseled on smoking cessation. Pt states she wants to quit "cold [place]" like her husband and does not want to pursue pharmacotherapy at this time.

## 2024-03-20 NOTE — Assessment & Plan Note (Signed)
 Pt concerned with breast nodule that has been present over a year. She becomes emotional talking about this. On exam, small area at 12 oclock that feels like ridges is appreciated. No TTP. A diagnostic MM and ultrasound was obtained last year that showed calcifications was seen and otherwise a benign study. Recommendation for screening MM in one year was given. Will order bilateral screening MM. Reassured pt of previous benign results and unchanged nodule.

## 2024-03-20 NOTE — Assessment & Plan Note (Signed)
 Pt was recently admitted for COPD exacerbation. Feels her breathing is at baseline currently. Home regimen is Treglegy and albuterol  as needed. Pt reports using the albuterol  regularly and Trelegy as needed. Advised to use trelegy every day and to use albuterol  as needed.

## 2024-03-21 ENCOUNTER — Ambulatory Visit: Payer: Self-pay | Admitting: Internal Medicine

## 2024-03-21 LAB — LIPID PANEL
Chol/HDL Ratio: 2.6 ratio (ref 0.0–4.4)
Cholesterol, Total: 169 mg/dL (ref 100–199)
HDL: 64 mg/dL (ref >39)
LDL Chol Calc (NIH): 84 mg/dL (ref 0–99)
Triglycerides: 120 mg/dL (ref 0–149)
VLDL Cholesterol Cal: 21 mg/dL (ref 5–40)

## 2024-03-21 LAB — BMP8+ANION GAP
Anion Gap: 15 mmol/L (ref 10.0–18.0)
BUN/Creatinine Ratio: 14 (ref 12–28)
BUN: 10 mg/dL (ref 8–27)
CO2: 22 mmol/L (ref 20–29)
Calcium: 8.9 mg/dL (ref 8.7–10.3)
Chloride: 101 mmol/L (ref 96–106)
Creatinine, Ser: 0.73 mg/dL (ref 0.57–1.00)
Glucose: 90 mg/dL (ref 70–99)
Potassium: 3.8 mmol/L (ref 3.5–5.2)
Sodium: 138 mmol/L (ref 134–144)
eGFR: 87 mL/min/{1.73_m2} (ref >59)

## 2024-03-21 NOTE — Progress Notes (Signed)
 Internal Medicine Clinic Attending  Case discussed with the resident at the time of the visit.  We reviewed the resident's history and exam and pertinent patient test results.  I agree with the assessment, diagnosis, and plan of care documented in the resident's note.

## 2024-03-22 ENCOUNTER — Other Ambulatory Visit: Payer: Self-pay

## 2024-03-22 NOTE — Transitions of Care (Post Inpatient/ED Visit) (Signed)
 Care Management  Transitions of Care Program Transitions of Care Post-discharge week 4  03/22/2024 Name: [name] MRN: [id number] DOB: 10/22/1951  Subjective: [name] is a 73 y.o. year old female who is a primary care patient of Cathey Clunes, MD. The Care Management team was unable to reach the patient by phone to assess and address transitions of care needs.   Plan: Additional outreach attempts will be made to reach the patient enrolled in the [facility] Program (Post Inpatient/ED Visit).  Tonia Frankel RN, CCM [place]  VBCI-Population Health RN Care Manager [phone]

## 2024-03-23 NOTE — Transitions of Care (Post Inpatient/ED Visit) (Signed)
 Care Management  Transitions of Care Program Transitions of Care Post-discharge week 4  03/23/2024 Name: [name] MRN: [id number] DOB: [birth year]  Subjective: [name] is a 73 y.o. year old female who is a primary care patient of Cathey Clunes, MD. The Care Management team spoke with patient by telephone to assess and address transitions of care needs. Patient answered but requested call back next week.   Plan: Additional outreach attempts will be made to reach the patient enrolled in the [facility] Program (Post Inpatient/ED Visit).  Tonia Frankel RN, CCM [place]  VBCI-Population Health RN Care Manager [phone]

## 2024-03-26 ENCOUNTER — Telehealth: Payer: Self-pay

## 2024-03-26 NOTE — Transitions of Care (Post Inpatient/ED Visit) (Signed)
 Care Management  Transitions of Care Program Transitions of Care Post-discharge week 4  03/26/2024 Name: [name] MRN: [id number] DOB: Jan 27, 1951  Subjective: [name] is a 73 y.o. year old female who is a primary care patient of Cathey Clunes, MD. The Care Management team was unable to reach the patient by phone to assess and address transitions of care needs.   Plan: No further outreach attempts will be made at this time.  We have been unable to reach the patient.  Tonia Frankel RN, CCM [place]  VBCI-Population Health RN Care Manager [phone]

## 2024-03-30 ENCOUNTER — Ambulatory Visit

## 2024-04-02 NOTE — Progress Notes (Deleted)
 CC: HTN follow up  HPI:  [name] is a 73 y.o. with medical history of HTN, HLD, DMII, COPD, allergic rhinitis  presenting to [facility] for HTN follow up.   Please see problem-based list for further details, assessments, and plans.  Past Medical History:  Diagnosis Date   Acute sinusitis 01/09/2019   Acute sinusitis 01/09/2019   Anxiety    Arthritis    fingers, knees (08/16/2018)   Asthma    Atopic dermatitis 03/20/2018   Axillary hidradenitis suppurativa 10/13/2018   Cancer of right breast (HCC) 1991   s/p lumpectomy, chemotherapy and radiation therapy in 1991. Mammogram in 2007 was normal.   Cellulitis of buttock, left 12/13/2022   Constipated    h/o   COPD (chronic obstructive pulmonary disease) (HCC)    History of multiple hospital admissions for exercabation    COPD exacerbation (HCC) 01/01/2019   COPD exacerbation (HCC) 02/17/2024   COPD with acute exacerbation (HCC) 01/14/2019   COPD with exacerbation (HCC) 04/06/2009   Qualifier: Diagnosis of  By: Loletta MD, Vijay     Depression    Diarrhea    h/o   Facial cellulitis 08/30/2022   Facial edema 08/31/2022   GERD (gastroesophageal reflux disease)    Grade I diastolic dysfunction 01/12/2024   Headache    a few times/month (08/16/2018   Heart murmur 10/05/2011   first time I ever heard I had one was today   History of breast cancer 12/01/2012   Pt with h/o breast CA s/p lumpectomy with chemo/radiation in 1991. Pt mammogram 2011 was unremarkable. Had CT chest 10/2012 in ED for SOB and showed spiculated nodule with lymph node. 12/04/12: Birads 2; repeat diagnostic mammogram in 1 year.     Hyperlipidemia    Hypertension    Lower extremity edema 09/21/2018   Obesity    Personal history of chemotherapy    Personal history of radiation therapy    Pneumonia    couple times in the last 10-15 yrs (08/16/2018)   QT prolongation 08/08/2014   Seasonal allergies 02/25/2017   Shortness of breath 10/05/2011    at rest; lying down; w/exertion   Sigmoid diverticulitis 80/2008   Tobacco abuse    Type 2 diabetes mellitus (HCC) 05/14/2009   Type II diabetes mellitus (HCC)      Current Outpatient Medications (Cardiovascular):    amLODipine  (NORVASC ) 10 MG tablet, Take 1 tablet (10 mg total) by mouth daily.   losartan -hydrochlorothiazide  (HYZAAR) 50-12.5 MG tablet, Take 1 tablet by mouth 2 (two) times daily.   metoprolol  tartrate (LOPRESSOR ) 25 MG tablet, Take 1 tablet (25 mg total) by mouth 2 (two) times daily.  Current Outpatient Medications (Respiratory):    albuterol  (PROVENTIL ) (2.5 MG/3ML) 0.083% nebulizer solution, INHALE 1 VIAL VIA NEBULIZER EVERY 4 HOURS AS NEEDED FOR SHORTNESS OF BREATH OR WHEEZING   albuterol  (VENTOLIN  HFA) 108 (90 Base) MCG/ACT inhaler, Inhale 2 puffs into the lungs every 6 (six) hours as needed for wheezing or shortness of breath.   Fluticasone -Umeclidin-Vilant (TRELEGY ELLIPTA ) 100-62.5-25 MCG/ACT AEPB, Inhale 1 puff into the lungs daily.  Current Outpatient Medications (Analgesics):    acetaminophen  (TYLENOL ) 325 MG tablet, Take 2 tablets (650 mg total) by mouth every 6 (six) hours as needed for mild pain (or Fever >/= 101).    Review of Systems:  Review of system negative unless stated in the problem list or HPI.    Physical Exam:  There were no vitals filed for this visit. Physical Exam General:  NAD HENT: NCAT Lungs: CTAB, no wheeze, rhonchi or rales.  Cardiovascular: Normal heart sounds, no r/m/g, 2+ pulses in all extremities. No LE edema Abdomen: No TTP, normal bowel sounds MSK: No asymmetry or muscle atrophy.  Skin: no lesions noted on exposed skin Neuro: Alert and oriented x4. CN grossly intact Psych: Normal mood and normal affect   Assessment & Plan:   No problem-specific Assessment & Plan notes found for this encounter.   See Encounters Tab for problem based charting.  Patient Discussed with Dr.  {[id number]::Hlpoonli,Ynqqfjw,Floozw,Wjmzwimj,Cpwrzwu,Fjryzw,Ojl,Yjuryzm,Tpoopjfd} Morene Bathe, MD Jolynn DEL. [facility] Internal Medicine Residency, PGY-3   HTN Restarted on amlodipine  10 mg, Hyzaar 50-12.5 mg BID, Lopressor  25 mg BID. BMP this visit.

## 2024-04-03 ENCOUNTER — Encounter: Admitting: Internal Medicine

## 2024-04-10 ENCOUNTER — Other Ambulatory Visit: Payer: Self-pay | Admitting: Licensed Clinical Social Worker

## 2024-04-13 ENCOUNTER — Encounter: Payer: Self-pay | Admitting: Student

## 2024-04-13 ENCOUNTER — Encounter: Admitting: Student

## 2024-04-13 NOTE — Progress Notes (Deleted)
 CC: ***  HPI:  [name] is a 73 y.o. female living with a history stated below and presents today for ***. Please see problem based assessment and plan for additional details.  Past Medical History:  Diagnosis Date   Acute sinusitis 01/09/2019   Acute sinusitis 01/09/2019   Anxiety    Arthritis    "fingers, knees" (08/16/2018)   Asthma    Atopic dermatitis 03/20/2018   Axillary hidradenitis suppurativa 10/13/2018   Cancer of right breast (HCC) 1991   s/p lumpectomy, chemotherapy and radiation therapy in 1991. Mammogram in 2007 was normal.   Cellulitis of buttock, left 12/13/2022   Constipated    h/o   COPD (chronic obstructive pulmonary disease) (HCC)    History of multiple hospital admissions for exercabation    COPD exacerbation (HCC) 01/01/2019   COPD exacerbation (HCC) 02/17/2024   COPD with acute exacerbation (HCC) 01/14/2019   COPD with exacerbation (HCC) 04/06/2009   Qualifier: Diagnosis of  By: Derald Flattery MD, Vijay     Depression    Diarrhea    h/o   Facial cellulitis 08/30/2022   Facial edema 08/31/2022   GERD (gastroesophageal reflux disease)    Grade I diastolic dysfunction 01/12/2024   Headache    "a few times/month" (08/16/2018   Heart murmur 10/05/2011   "first time I ever heard I had one was today"   History of breast cancer 12/01/2012   Pt with h/o breast CA s/p lumpectomy with chemo/radiation in 1991. Pt mammogram 2011 was unremarkable. Had CT chest 10/2012 in ED for SOB and showed spiculated nodule with lymph node. 12/04/12: Birads 2; repeat diagnostic mammogram in 1 year.     Hyperlipidemia    Hypertension    Lower extremity edema 09/21/2018   Obesity    Personal history of chemotherapy    Personal history of radiation therapy    Pneumonia    "couple times in the last 10-15 yrs" (08/16/2018)   QT prolongation 08/08/2014   Seasonal allergies 02/25/2017   Shortness of breath 10/05/2011   "at rest; lying down; w/exertion"   Sigmoid  diverticulitis 80/2008   Tobacco abuse    Type 2 diabetes mellitus (HCC) 05/14/2009   Type II diabetes mellitus (HCC)     Current Outpatient Medications on File Prior to Visit  Medication Sig Dispense Refill   acetaminophen  (TYLENOL ) 325 MG tablet Take 2 tablets (650 mg total) by mouth every 6 (six) hours as needed for mild pain (or Fever >/= 101).     albuterol  (PROVENTIL ) (2.5 MG/3ML) 0.083% nebulizer solution INHALE 1 VIAL VIA NEBULIZER EVERY 4 HOURS AS NEEDED FOR SHORTNESS OF BREATH OR WHEEZING 225 mL 2   albuterol  (VENTOLIN  HFA) 108 (90 Base) MCG/ACT inhaler Inhale 2 puffs into the lungs every 6 (six) hours as needed for wheezing or shortness of breath. 6.7 g 3   amLODipine  (NORVASC ) 10 MG tablet Take 1 tablet (10 mg total) by mouth daily. 90 tablet 3   Fluticasone -Umeclidin-Vilant (TRELEGY ELLIPTA ) 100-62.5-25 MCG/ACT AEPB Inhale 1 puff into the lungs daily. 60 each 0   losartan -hydrochlorothiazide  (HYZAAR) 50-12.5 MG tablet Take 1 tablet by mouth 2 (two) times daily. 180 tablet 3   metoprolol  tartrate (LOPRESSOR ) 25 MG tablet Take 1 tablet (25 mg total) by mouth 2 (two) times daily. 180 tablet 3   No current facility-administered medications on file prior to visit.    Family History  Problem Relation Age of Onset   Cancer Mother  unknown   Breast cancer Daughter 30   Breast cancer Daughter        95s   BRCA 1/2 Neg Hx     Social History   Socioeconomic History   Marital status: Married    Spouse name: Not on file   Number of children: Not on file   Years of education: 12   Highest education level: Not on file  Occupational History    Employer: UNEMPLOYED  Tobacco Use   Smoking status: Every Day    Current packs/day: 0.50    Average packs/day: 0.5 packs/day for 45.0 years (22.5 ttl pk-yrs)    Types: Cigarettes   Smokeless tobacco: Never   Tobacco comments:    1/2 pk per day  Vaping Use   Vaping status: Never Used  Substance and Sexual Activity   Alcohol   use: Yes    Alcohol /week: 4.0 standard drinks of alcohol     Types: 4 Cans of beer per week    Comment: 08/16/2018 "weekends only"   Drug use: No   Sexual activity: Yes  Other Topics Concern   Not on file  Social History Narrative   Lived in [place] with her husband.   Takes care of 3 grand children.   Trying to find a job, has financial difficulties.      Social Drivers of Corporate investment banker Strain: Low Risk  (12/13/2022)   Overall Financial Resource Strain (CARDIA)    Difficulty of Paying Living Expenses: Not hard at all  Food Insecurity: No Food Insecurity (02/23/2024)   Hunger Vital Sign    Worried About Running Out of Food in the Last Year: Never true    Ran Out of Food in the Last Year: Never true  Transportation Needs: No Transportation Needs (02/23/2024)   PRAPARE - Administrator, Civil Service (Medical): No    Lack of Transportation (Non-Medical): No  Physical Activity: Inactive (01/03/2024)   Exercise Vital Sign    Days of Exercise per Week: 0 days    Minutes of Exercise per Session: 0 min  Stress: Stress Concern Present (01/03/2024)   [employer] of Occupational Health - Occupational Stress Questionnaire    Feeling of Stress : To some extent  Social Connections: Unknown (02/17/2024)   Social Connection and Isolation Panel [NHANES]    Frequency of Communication with Friends and Family: Patient declined    Frequency of Social Gatherings with Friends and Family: Patient declined    Attends Religious Services: Patient declined    Database administrator or Organizations: Patient declined    Attends Banker Meetings: Patient declined    Marital Status: Married  Catering manager Violence: Not At Risk (02/23/2024)   Humiliation, Afraid, Rape, and Kick questionnaire    Fear of Current or Ex-Partner: No    Emotionally Abused: No    Physically Abused: No    Sexually Abused: No    Review of Systems: ROS negative except for what is noted  on the assessment and plan.  There were no vitals filed for this visit.  Physical Exam  Physical Exam: Constitutional: well-appearing *** sitting in ***, in no acute distress HENT: normocephalic atraumatic, mucous membranes moist Eyes: conjunctiva non-erythematous Neck: supple Cardiovascular: regular rate and rhythm, no m/r/g Pulmonary/Chest: normal work of breathing on room air, lungs clear to auscultation bilaterally Abdominal: soft, non-tender, non-distended MSK: *** Neurological: alert & oriented x 3, 5/5 strength in bilateral upper and lower extremities, normal gait Skin: warm  and dry Psych: ***  Assessment & Plan:   No problem-specific Assessment & Plan notes found for this encounter.  PMH: HTN, T2DM with neuropathy, osteoporosis  HTN Uncontrolled at last visit was 197/85. Unclear if taking medications which include amlodipine  10, losartan -HCTZ 50-12.5, metoprolol  25 twice daily given noted dispense records.  She was restarted on her previous regimen. BMP 03/19/2024 with normal electrolytes and normal renal function  Plan -Continue *** -Repeat BMP  HLD Has history of uncontrolled HTN, current smoker, T2DM.  Not taking statin therapy.  LDL 84 on 03/2024. The 10-year ASCVD risk score (Arnett DK, et al., 2019) is: 73.4% Plan -Initiate high intensity with atorvastatin  40 mg daily  Breast nodule Hx of R breast cancer 1991 s/p lumpectomy. Had diagnostic MM in 2024 with no evidence of malignancy with benign dystrophic calcification at lumpectomy site. Pending bilateral MM per last OV 03/2024.   Tobacco use *** ***lung cancer screening   CARE: DXA, colo, Tdap, eye, lung    Patient {GC/GE:[id number]::"discussed with","seen with"} Dr. {[id number]::"WJXBJYNW","G. Hoffman","Mullen","Narendra","Vincent","Guilloud","Lau","Machen"}  Jearldine Mina, D.O. [facility] Internal Medicine, PGY-2 Phone: [phone] Date 04/13/2024 Time 7:36 AM

## 2024-04-18 ENCOUNTER — Inpatient Hospital Stay (HOSPITAL_COMMUNITY)
Admission: EM | Admit: 2024-04-18 | Discharge: 2024-04-22 | DRG: 189 | Disposition: A | Discharge status: Home / Self Care | Attending: Internal Medicine | Admitting: Internal Medicine

## 2024-04-18 ENCOUNTER — Other Ambulatory Visit: Payer: Self-pay | Admitting: Student

## 2024-04-18 ENCOUNTER — Emergency Department (HOSPITAL_COMMUNITY)

## 2024-04-18 ENCOUNTER — Encounter (HOSPITAL_COMMUNITY): Payer: Self-pay

## 2024-04-18 ENCOUNTER — Other Ambulatory Visit: Payer: Self-pay

## 2024-04-18 DIAGNOSIS — Z6825 Body mass index (BMI) 25.0-25.9, adult: Secondary | ICD-10-CM | POA: Diagnosis not present

## 2024-04-18 DIAGNOSIS — R059 Cough, unspecified: Secondary | ICD-10-CM | POA: Diagnosis not present

## 2024-04-18 DIAGNOSIS — Z803 Family history of malignant neoplasm of breast: Secondary | ICD-10-CM | POA: Diagnosis not present

## 2024-04-18 DIAGNOSIS — Z853 Personal history of malignant neoplasm of breast: Secondary | ICD-10-CM | POA: Diagnosis not present

## 2024-04-18 DIAGNOSIS — J9601 Acute respiratory failure with hypoxia: Principal | ICD-10-CM | POA: Diagnosis present

## 2024-04-18 DIAGNOSIS — Z888 Allergy status to other drugs, medicaments and biological substances status: Secondary | ICD-10-CM | POA: Diagnosis not present

## 2024-04-18 DIAGNOSIS — Z9221 Personal history of antineoplastic chemotherapy: Secondary | ICD-10-CM | POA: Diagnosis not present

## 2024-04-18 DIAGNOSIS — Z79899 Other long term (current) drug therapy: Secondary | ICD-10-CM | POA: Diagnosis not present

## 2024-04-18 DIAGNOSIS — E119 Type 2 diabetes mellitus without complications: Secondary | ICD-10-CM | POA: Diagnosis present

## 2024-04-18 DIAGNOSIS — Z981 Arthrodesis status: Secondary | ICD-10-CM

## 2024-04-18 DIAGNOSIS — E785 Hyperlipidemia, unspecified: Secondary | ICD-10-CM | POA: Diagnosis present

## 2024-04-18 DIAGNOSIS — R000 Tachycardia, unspecified: Secondary | ICD-10-CM | POA: Diagnosis not present

## 2024-04-18 DIAGNOSIS — I160 Hypertensive urgency: Secondary | ICD-10-CM | POA: Diagnosis not present

## 2024-04-18 DIAGNOSIS — I10 Essential (primary) hypertension: Secondary | ICD-10-CM | POA: Diagnosis not present

## 2024-04-18 DIAGNOSIS — Z90710 Acquired absence of both cervix and uterus: Secondary | ICD-10-CM | POA: Diagnosis not present

## 2024-04-18 DIAGNOSIS — R069 Unspecified abnormalities of breathing: Secondary | ICD-10-CM | POA: Diagnosis not present

## 2024-04-18 DIAGNOSIS — E669 Obesity, unspecified: Secondary | ICD-10-CM | POA: Diagnosis not present

## 2024-04-18 DIAGNOSIS — R0600 Dyspnea, unspecified: Secondary | ICD-10-CM | POA: Diagnosis not present

## 2024-04-18 DIAGNOSIS — R9431 Abnormal electrocardiogram [ECG] [EKG]: Secondary | ICD-10-CM | POA: Diagnosis not present

## 2024-04-18 DIAGNOSIS — K219 Gastro-esophageal reflux disease without esophagitis: Secondary | ICD-10-CM | POA: Diagnosis present

## 2024-04-18 DIAGNOSIS — F17210 Nicotine dependence, cigarettes, uncomplicated: Secondary | ICD-10-CM

## 2024-04-18 DIAGNOSIS — Z923 Personal history of irradiation: Secondary | ICD-10-CM | POA: Diagnosis not present

## 2024-04-18 DIAGNOSIS — F17200 Nicotine dependence, unspecified, uncomplicated: Secondary | ICD-10-CM

## 2024-04-18 DIAGNOSIS — J441 Chronic obstructive pulmonary disease with (acute) exacerbation: Secondary | ICD-10-CM | POA: Diagnosis not present

## 2024-04-18 DIAGNOSIS — I493 Ventricular premature depolarization: Secondary | ICD-10-CM | POA: Diagnosis not present

## 2024-04-18 LAB — BASIC METABOLIC PANEL WITH GFR
Anion gap: 10 (ref 5–15)
BUN: 7 mg/dL — ABNORMAL LOW (ref 8–23)
CO2: 23 mmol/L (ref 22–32)
Calcium: 8.6 mg/dL — ABNORMAL LOW (ref 8.9–10.3)
Chloride: 106 mmol/L (ref 98–111)
Creatinine, Ser: 0.74 mg/dL (ref 0.44–1.00)
GFR, Estimated: >60 mL/min (ref >60)
Glucose, Bld: 126 mg/dL — ABNORMAL HIGH (ref 70–99)
Potassium: 3.3 mmol/L — ABNORMAL LOW (ref 3.5–5.1)
Sodium: 139 mmol/L (ref 135–145)

## 2024-04-18 LAB — CBC WITH DIFFERENTIAL/PLATELET
Abs Immature Granulocytes: 0.01 10*3/uL (ref 0.00–0.07)
Basophils Absolute: 0 10*3/uL (ref 0.0–0.1)
Basophils Relative: 0 %
Eosinophils Absolute: 0.6 10*3/uL — ABNORMAL HIGH (ref 0.0–0.5)
Eosinophils Relative: 8 %
HCT: 38.6 % (ref 36.0–46.0)
Hemoglobin: 12.6 g/dL (ref 12.0–15.0)
Immature Granulocytes: 0 %
Lymphocytes Relative: 21 %
Lymphs Abs: 1.6 10*3/uL (ref 0.7–4.0)
MCH: 29.9 pg (ref 26.0–34.0)
MCHC: 32.6 g/dL (ref 30.0–36.0)
MCV: 91.5 fL (ref 80.0–100.0)
Monocytes Absolute: 0.3 10*3/uL (ref 0.1–1.0)
Monocytes Relative: 4 %
Neutro Abs: 5.2 10*3/uL (ref 1.7–7.7)
Neutrophils Relative %: 67 %
Platelets: 283 10*3/uL (ref 150–400)
RBC: 4.22 MIL/uL (ref 3.87–5.11)
RDW: 14.2 % (ref 11.5–15.5)
WBC: 7.8 10*3/uL (ref 4.0–10.5)
nRBC: 0 % (ref 0.0–0.2)

## 2024-04-18 LAB — BRAIN NATRIURETIC PEPTIDE: B Natriuretic Peptide: 108.1 pg/mL — ABNORMAL HIGH (ref 0.0–100.0)

## 2024-04-18 LAB — D-DIMER, QUANTITATIVE: D-Dimer, Quant: 0.31 ug{FEU}/mL (ref 0.00–0.50)

## 2024-04-18 MED ORDER — METOPROLOL TARTRATE 25 MG PO TABS
25.0000 mg | ORAL_TABLET | Freq: Once | ORAL | Status: AC
  Administered 2024-04-18: 25 mg via ORAL
  Filled 2024-04-18: qty 1

## 2024-04-18 MED ORDER — IPRATROPIUM-ALBUTEROL 0.5-2.5 (3) MG/3ML IN SOLN
3.0000 mL | Freq: Once | RESPIRATORY_TRACT | Status: AC
  Administered 2024-04-18: 3 mL via RESPIRATORY_TRACT
  Filled 2024-04-18: qty 3

## 2024-04-18 MED ORDER — HYDROCHLOROTHIAZIDE 12.5 MG PO TABS
12.5000 mg | ORAL_TABLET | Freq: Once | ORAL | Status: AC
  Administered 2024-04-18: 12.5 mg via ORAL
  Filled 2024-04-18: qty 1

## 2024-04-18 MED ORDER — ACETAMINOPHEN 325 MG PO TABS
650.0000 mg | ORAL_TABLET | Freq: Four times a day (QID) | ORAL | Status: DC | PRN
  Administered 2024-04-19 – 2024-04-20 (×2): 650 mg via ORAL
  Filled 2024-04-18 (×2): qty 2

## 2024-04-18 MED ORDER — NICOTINE 21 MG/24HR TD PT24
21.0000 mg | MEDICATED_PATCH | Freq: Every day | TRANSDERMAL | Status: DC
  Administered 2024-04-18: 21 mg via TRANSDERMAL
  Filled 2024-04-18 (×4): qty 1

## 2024-04-18 MED ORDER — IPRATROPIUM-ALBUTEROL 0.5-2.5 (3) MG/3ML IN SOLN
3.0000 mL | Freq: Four times a day (QID) | RESPIRATORY_TRACT | Status: DC
  Administered 2024-04-18 – 2024-04-21 (×13): 3 mL via RESPIRATORY_TRACT
  Filled 2024-04-18 (×13): qty 3

## 2024-04-18 MED ORDER — SENNOSIDES-DOCUSATE SODIUM 8.6-50 MG PO TABS: 1.0000 | ORAL_TABLET | Freq: Every evening | ORAL | Status: DC | PRN

## 2024-04-18 MED ORDER — HYDROCHLOROTHIAZIDE 12.5 MG PO CAPS
12.5000 mg | ORAL_CAPSULE | Freq: Once | ORAL | Status: DC
  Filled 2024-04-18: qty 1

## 2024-04-18 MED ORDER — ONDANSETRON HCL 4 MG PO TABS
4.0000 mg | ORAL_TABLET | Freq: Four times a day (QID) | ORAL | Status: DC | PRN
Start: 2024-04-18 — End: 2024-04-22

## 2024-04-18 MED ORDER — LOSARTAN POTASSIUM 50 MG PO TABS
50.0000 mg | ORAL_TABLET | Freq: Once | ORAL | Status: AC
  Administered 2024-04-18: 50 mg via ORAL
  Filled 2024-04-18: qty 1

## 2024-04-18 MED ORDER — AMLODIPINE BESYLATE 5 MG PO TABS
10.0000 mg | ORAL_TABLET | Freq: Once | ORAL | Status: AC
  Administered 2024-04-18: 10 mg via ORAL
  Filled 2024-04-18: qty 2

## 2024-04-18 MED ORDER — HYDRALAZINE HCL 20 MG/ML IJ SOLN
10.0000 mg | Freq: Once | INTRAMUSCULAR | Status: AC
  Administered 2024-04-18: 10 mg via INTRAVENOUS
  Filled 2024-04-18: qty 1

## 2024-04-18 MED ORDER — ACETAMINOPHEN 650 MG RE SUPP: 650.0000 mg | Freq: Four times a day (QID) | RECTAL | Status: DC | PRN

## 2024-04-18 MED ORDER — ENOXAPARIN SODIUM 40 MG/0.4ML IJ SOSY
40.0000 mg | PREFILLED_SYRINGE | INTRAMUSCULAR | Status: DC
  Administered 2024-04-19 – 2024-04-22 (×4): 40 mg via SUBCUTANEOUS
  Filled 2024-04-18 (×4): qty 0.4

## 2024-04-18 MED ORDER — ONDANSETRON HCL 4 MG/2ML IJ SOLN: 4.0000 mg | Freq: Four times a day (QID) | INTRAMUSCULAR | Status: DC | PRN

## 2024-04-18 MED ORDER — IPRATROPIUM-ALBUTEROL 0.5-2.5 (3) MG/3ML IN SOLN
RESPIRATORY_TRACT | Status: AC
  Filled 2024-04-18: qty 3

## 2024-04-18 NOTE — Telephone Encounter (Unsigned)
 Copied from CRM [id number]. Topic: Clinical - Medication Refill >> Apr 18, 2024  8:53 AM Blair Bumpers wrote: Medication: albuterol  (PROVENTIL ) (2.5 MG/3ML) 0.083% nebulizer solution  Has the patient contacted their pharmacy? No (Agent: If no, request that the patient contact the pharmacy for the refill. If patient does not wish to contact the pharmacy document the reason why and proceed with request.) (Agent: If yes, when and what did the pharmacy advise?)  This is the patient's preferred pharmacy:  [facility] [address], [place] - [address] AT [address] [place] [address] Phone: [phone] Fax: [phone]  Is this the correct pharmacy for this prescription? Yes If no, delete pharmacy and type the correct one.   Has the prescription been filled recently? Yes  Is the patient out of the medication? Yes  Has the patient been seen for an appointment in the last year OR does the patient have an upcoming appointment? Yes  Can we respond through MyChart? Yes  Agent: Please be advised that Rx refills may take up to 3 business days. We ask that you follow-up with your pharmacy.

## 2024-04-18 NOTE — ED Provider Notes (Signed)
 [place] EMERGENCY DEPARTMENT AT [facility] Provider Note  CSN: [id number] Arrival date & time: 04/18/24 1731  Chief Complaint(s) Shortness of Breath  HPI [name] is a 73 y.o. female here today for shortness of breath.  Patient has a past medical history of COPD, here today for shortness of breath that began yesterday.  Patient endorses cough, wheezing.  She has been compliant with her inhalers.   Past Medical History Past Medical History:  Diagnosis Date   Acute sinusitis 01/09/2019   Acute sinusitis 01/09/2019   Anxiety    Arthritis    fingers, knees (08/16/2018)   Asthma    Atopic dermatitis 03/20/2018   Axillary hidradenitis suppurativa 10/13/2018   Cancer of right breast (HCC) 1991   s/p lumpectomy, chemotherapy and radiation therapy in 1991. Mammogram in 2007 was normal.   Cellulitis of buttock, left 12/13/2022   Constipated    h/o   COPD (chronic obstructive pulmonary disease) (HCC)    History of multiple hospital admissions for exercabation    COPD exacerbation (HCC) 01/01/2019   COPD exacerbation (HCC) 02/17/2024   COPD with acute exacerbation (HCC) 01/14/2019   COPD with exacerbation (HCC) 04/06/2009   Qualifier: Diagnosis of  By: Derald Flattery MD, Vijay     Depression    Diarrhea    h/o   Facial cellulitis 08/30/2022   Facial edema 08/31/2022   GERD (gastroesophageal reflux disease)    Grade I diastolic dysfunction 01/12/2024   Headache    a few times/month (08/16/2018   Heart murmur 10/05/2011   first time I ever heard I had one was today   History of breast cancer 12/01/2012   Pt with h/o breast CA s/p lumpectomy with chemo/radiation in 1991. Pt mammogram 2011 was unremarkable. Had CT chest 10/2012 in ED for SOB and showed spiculated nodule with lymph node. 12/04/12: Birads 2; repeat diagnostic mammogram in 1 year.     Hyperlipidemia    Hypertension    Lower extremity edema 09/21/2018   Obesity    Personal history of chemotherapy     Personal history of radiation therapy    Pneumonia    couple times in the last 10-15 yrs (08/16/2018)   QT prolongation 08/08/2014   Seasonal allergies 02/25/2017   Shortness of breath 10/05/2011   at rest; lying down; w/exertion   Sigmoid diverticulitis 80/2008   Tobacco abuse    Type 2 diabetes mellitus (HCC) 05/14/2009   Type II diabetes mellitus (HCC)    Patient Active Problem List   Diagnosis Date Noted   Memory changes 03/28/2023   Age-related osteoporosis without current pathological fracture  03/06/2021   Healthcare maintenance 03/06/2021   Chronic hip pain, right 06/22/2019   Diabetic neuropathy (HCC) 05/21/2019   Type 2 diabetes mellitus with complication, without long-term current use of insulin  (HCC) 12/26/2018   Chronic sinusitis    COPD (chronic obstructive pulmonary disease) (HCC) 12/24/2014   Dyslipidemia associated with type 2 diabetes mellitus (HCC) 06/14/2014   Breast nodule 12/01/2012   Primary osteoarthritis of right knee 09/29/2012   Anxiety 12/30/2009   Tobacco use disorder 04/06/2009   Hypertension associated with chronic kidney disease due to type 2 diabetes mellitus (HCC) 04/06/2009   Home Medication(s) Prior to Admission medications   Medication Sig Start Date End Date Taking? Authorizing Provider  acetaminophen  (TYLENOL ) 325 MG tablet Take 2 tablets (650 mg total) by mouth every 6 (six) hours as needed for mild pain (or Fever >/= 101). 08/23/22   [place]i Hammans,  [place], MD  albuterol  (PROVENTIL ) (2.5 MG/3ML) 0.083% nebulizer solution INHALE 1 VIAL VIA NEBULIZER EVERY 4 HOURS AS NEEDED FOR SHORTNESS OF BREATH OR WHEEZING 03/13/24   Cathey Clunes, MD  albuterol  (VENTOLIN  HFA) 108 (90 Base) MCG/ACT inhaler Inhale 2 puffs into the lungs every 6 (six) hours as needed for wheezing or shortness of breath. 01/10/24   Cleven Dallas, DO  amLODipine  (NORVASC ) 10 MG tablet Take 1 tablet (10 mg total) by mouth daily. 03/19/24 03/19/25  Jackolyn Masker, MD   Fluticasone -Umeclidin-Vilant (TRELEGY ELLIPTA ) 100-62.5-25 MCG/ACT AEPB Inhale 1 puff into the lungs daily. 03/19/24 05/18/24  Jackolyn Masker, MD  losartan -hydrochlorothiazide  (HYZAAR) 50-12.5 MG tablet Take 1 tablet by mouth 2 (two) times daily. 03/20/24   Cathey Clunes, MD  metoprolol  tartrate (LOPRESSOR ) 25 MG tablet Take 1 tablet (25 mg total) by mouth 2 (two) times daily. 03/19/24 03/19/25  Jackolyn Masker, MD                                                                                                                                    Past Surgical History Past Surgical History:  Procedure Laterality Date   ABDOMINAL HYSTERECTOMY     ANTERIOR CERVICAL DECOMP/DISCECTOMY FUSION  2012   Dr. Jackee Marus  put plate in; did something to my vertebrae   BACK SURGERY     BREAST LUMPECTOMY Right 1991   DOBUTAMINE  STRESS ECHO  08/2004   Inferior ischemia, normal LV systolic function, no significant CAD   MINOR IRRIGATION AND DEBRIDEMENT OF WOUND Right 09/04/2022   Procedure: IRRIGATION AND DEBRIDEMENT OF NASAL WOUND;  Surgeon: Rush Coupe, MD;  Location: [facility] OR;  Service: ENT;  Laterality: Right;   Family History Family History  Problem Relation Age of Onset   Cancer Mother        unknown   Breast cancer Daughter 55   Breast cancer Daughter        44s   BRCA 1/2 Neg Hx     Social History Social History   Tobacco Use   Smoking status: Every Day    Current packs/day: 0.50    Average packs/day: 0.5 packs/day for 45.0 years (22.5 ttl pk-yrs)    Types: Cigarettes   Smokeless tobacco: Never   Tobacco comments:    1/2 pk per day  Vaping Use   Vaping status: Never Used  Substance Use Topics   Alcohol  use: Yes    Alcohol /week: 4.0 standard drinks of alcohol     Types: 4 Cans of beer per week    Comment: 08/16/2018 weekends only   Drug use: No   Allergies Ace inhibitors  Review of Systems Review of Systems  Physical Exam Vital Signs  I have reviewed the triage vital  signs BP (!) 187/97   Pulse (!) 136   Temp 98.4 F (36.9 C) (Oral)   Resp (!) 30   Ht 5' 6 (1.676 m)   Wt 71.5 kg  SpO2 95%   BMI 25.44 kg/m   Physical Exam Vitals reviewed.  Cardiovascular:     Rate and Rhythm: Tachycardia present.  Pulmonary:     Breath sounds: Wheezing and rhonchi present. No decreased breath sounds or rales.  Abdominal:     Palpations: Abdomen is soft.  Neurological:     General: No focal deficit present.     Mental Status: She is alert.     ED Results and Treatments Labs (all labs ordered are listed, but only abnormal results are displayed) Labs Reviewed  BASIC METABOLIC PANEL WITH GFR - Abnormal; Notable for the following components:      Result Value   Potassium 3.3 (*)    Glucose, Bld 126 (*)    BUN 7 (*)    Calcium  8.6 (*)    All other components within normal limits  CBC WITH DIFFERENTIAL/PLATELET - Abnormal; Notable for the following components:   Eosinophils Absolute 0.6 (*)    All other components within normal limits  BRAIN NATRIURETIC PEPTIDE - Abnormal; Notable for the following components:   B Natriuretic Peptide 108.1 (*)    All other components within normal limits  D-DIMER, QUANTITATIVE                                                                                                                          Radiology DG Chest Portable 1 View Result Date: 04/18/2024 CLINICAL DATA:  Dyspnea. EXAM: PORTABLE CHEST 1 VIEW COMPARISON:  February 17, 2024. FINDINGS: The heart size and mediastinal contours are within normal limits. Both lungs are clear. Old right rib fractures are noted. IMPRESSION: No active disease. Electronically Signed   By: Rosalene Colon M.D.   On: 04/18/2024 18:04    Pertinent labs & imaging results that were available during my care of the patient were reviewed by me and considered in my medical decision making (see MDM for details).  Medications Ordered in ED Medications  metoprolol  tartrate (LOPRESSOR ) tablet  25 mg (has no administration in time range)  ipratropium-albuterol  (DUONEB) 0.5-2.5 (3) MG/3ML nebulizer solution 3 mL (3 mLs Nebulization Given 04/18/24 1804)  ipratropium-albuterol  (DUONEB) 0.5-2.5 (3) MG/3ML nebulizer solution 3 mL (3 mLs Nebulization Given 04/18/24 1902)  hydrALAZINE  (APRESOLINE ) injection 10 mg (10 mg Intravenous Given 04/18/24 2005)  amLODipine  (NORVASC ) tablet 10 mg (10 mg Oral Given 04/18/24 2055)  losartan  (COZAAR ) tablet 50 mg (50 mg Oral Given 04/18/24 2055)  hydrochlorothiazide  (HYDRODIURIL ) tablet 12.5 mg (12.5 mg Oral Given 04/18/24 2057)  Procedures .Critical Care  Performed by: Nathanael Baker, DO Authorized by: Nathanael Baker, DO   Critical care provider statement:    Critical care time (minutes):  33   Critical care was necessary to treat or prevent imminent or life-threatening deterioration of the following conditions:  Respiratory failure   Critical care was time spent personally by me on the following activities:  Development of treatment plan with patient or surrogate, discussions with consultants, evaluation of patient's response to treatment, examination of patient, ordering and review of laboratory studies, ordering and review of radiographic studies, ordering and performing treatments and interventions, pulse oximetry, re-evaluation of patient's condition and review of old charts   (including critical care time)  Medical Decision Making / ED Course   This patient presents to the ED for concern of shortness of breath, this involves an extensive number of treatment options, and is a complaint that carries with it a high risk of complications and morbidity.  The differential diagnosis includes COPD exacerbation, pulmonary edema, considered PE, less likely ACS, pleural effusions, pneumothorax.  MDM: With the patient's  history of COPD, this likely cause of her shortness of breath.  Chest x-ray ordered, blood work ordered.  D-dimer ordered as the patient does not have significantly impressive wheezing, could simply be a result of diminished air entry.  Patient received breathing treatments, steroids from EMS.  Will closely monitor patient's response to therapies.  Patient 92% on room air, does not wear home O2.  Reassessment 9:05 PM-patient hypertensive, have ordered some of her home meds for her, did have a single dose of hydralazine .  She did not take her BP meds this morning.  Patient quite tachycardic, believe this is likely due to all of the beta agonist she has received.  She also did not take her prescribed metoprolol .  Patient currently on 2 L, does get short of breath with speaking, O2 sat dropping to 93%.  This is a new oxygen  requirement for the patient, will admit her for this.  Her D-dimer is negative.  Chest x-ray, per my independent review, no evidence of pneumonia.  Additional history obtained: -Additional history obtained from EMS -External records from outside source obtained and reviewed including: Chart review including previous notes, labs, imaging, consultation notes   Lab Tests: -I ordered, reviewed, and interpreted labs.   The pertinent results include:   Labs Reviewed  BASIC METABOLIC PANEL WITH GFR - Abnormal; Notable for the following components:      Result Value   Potassium 3.3 (*)    Glucose, Bld 126 (*)    BUN 7 (*)    Calcium  8.6 (*)    All other components within normal limits  CBC WITH DIFFERENTIAL/PLATELET - Abnormal; Notable for the following components:   Eosinophils Absolute 0.6 (*)    All other components within normal limits  BRAIN NATRIURETIC PEPTIDE - Abnormal; Notable for the following components:   B Natriuretic Peptide 108.1 (*)    All other components within normal limits  D-DIMER, QUANTITATIVE      EKG sinus tachycardia, no acute ischemia  EKG  Interpretation Date/Time:  Wednesday April 18 2024 17:40:44 EDT Ventricular Rate:  122 PR Interval:  135 QRS Duration:  93 QT Interval:  324 QTC Calculation: 462 R Axis:   69  Text Interpretation: Sinus arrhythmia Multiform ventricular premature complexes LAE, consider biatrial enlargement Anteroseptal infarct, age indeterminate Confirmed by Afton Horse [phone]) on 04/18/2024 5:52:01 PM         Imaging  Studies ordered: I ordered imaging studies including chest x-ray I independently visualized and interpreted imaging. I agree with the radiologist interpretation   Medicines ordered and prescription drug management: Meds ordered this encounter  Medications   ipratropium-albuterol  (DUONEB) 0.5-2.5 (3) MG/3ML nebulizer solution 3 mL   ipratropium-albuterol  (DUONEB) 0.5-2.5 (3) MG/3ML nebulizer solution 3 mL   hydrALAZINE  (APRESOLINE ) injection 10 mg   amLODipine  (NORVASC ) tablet 10 mg   losartan  (COZAAR ) tablet 50 mg   DISCONTD: hydrochlorothiazide  (MICROZIDE ) capsule 12.5 mg   hydrochlorothiazide  (HYDRODIURIL ) tablet 12.5 mg   metoprolol  tartrate (LOPRESSOR ) tablet 25 mg    -I have reviewed the patients home medicines and have made adjustments as needed  Critical interventions Management of hypoxia  Cardiac Monitoring: The patient was maintained on a cardiac monitor.  I personally viewed and interpreted the cardiac monitored which showed an underlying rhythm of: Tachycardia  Social Determinants of Health:  Factors impacting patients care include: Lack of access to primary care   Reevaluation: After the interventions noted above, I reevaluated the patient and found that they have :improved  Co morbidities that complicate the patient evaluation  Past Medical History:  Diagnosis Date   Acute sinusitis 01/09/2019   Acute sinusitis 01/09/2019   Anxiety    Arthritis    fingers, knees (08/16/2018)   Asthma    Atopic dermatitis 03/20/2018   Axillary hidradenitis  suppurativa 10/13/2018   Cancer of right breast (HCC) 1991   s/p lumpectomy, chemotherapy and radiation therapy in 1991. Mammogram in 2007 was normal.   Cellulitis of buttock, left 12/13/2022   Constipated    h/o   COPD (chronic obstructive pulmonary disease) (HCC)    History of multiple hospital admissions for exercabation    COPD exacerbation (HCC) 01/01/2019   COPD exacerbation (HCC) 02/17/2024   COPD with acute exacerbation (HCC) 01/14/2019   COPD with exacerbation (HCC) 04/06/2009   Qualifier: Diagnosis of  By: Derald Flattery MD, Vijay     Depression    Diarrhea    h/o   Facial cellulitis 08/30/2022   Facial edema 08/31/2022   GERD (gastroesophageal reflux disease)    Grade I diastolic dysfunction 01/12/2024   Headache    a few times/month (08/16/2018   Heart murmur 10/05/2011   first time I ever heard I had one was today   History of breast cancer 12/01/2012   Pt with h/o breast CA s/p lumpectomy with chemo/radiation in 1991. Pt mammogram 2011 was unremarkable. Had CT chest 10/2012 in ED for SOB and showed spiculated nodule with lymph node. 12/04/12: Birads 2; repeat diagnostic mammogram in 1 year.     Hyperlipidemia    Hypertension    Lower extremity edema 09/21/2018   Obesity    Personal history of chemotherapy    Personal history of radiation therapy    Pneumonia    couple times in the last 10-15 yrs (08/16/2018)   QT prolongation 08/08/2014   Seasonal allergies 02/25/2017   Shortness of breath 10/05/2011   at rest; lying down; w/exertion   Sigmoid diverticulitis 80/2008   Tobacco abuse    Type 2 diabetes mellitus (HCC) 05/14/2009   Type II diabetes mellitus (HCC)          Final Clinical Impression(s) / ED Diagnoses Final diagnoses:  COPD exacerbation (HCC)  Acute hypoxic respiratory failure (HCC)     @PCDICTATION @    Afton Horse T, DO 04/18/24 2113

## 2024-04-18 NOTE — H&P (Signed)
 History and Physical  [name] ZOX:[id number] DOB: 10-Nov-1950 DOA: 04/18/2024  PCP: Cathey Clunes, MD   Chief Complaint: Shortness of breath  HPI: [name] is a 73 y.o. female with medical history significant for COPD, HTN, HLD and depression who presents to the ED for evaluation of shortness of breath and wheezing.  Patient reports that yesterday evening she started having worsening shortness of breath and wheezing.  She gave herself 1 breathing treatment however she woke up this morning with significant shortness of breath so she called 911.  She endorsed mild cough but denies any fevers, chills, chest pain, dizziness, headache, nausea, vomiting or abdominal pain.  She reports using her inhalers daily but continues to smoke 1 pack/day.  She is interested in quitting.  On EMS arrival, patient was found with significant wheezing and O2 sats in the low 90s.  She was given albuterol  50 mg x 3 doses, Atrovent  1 mg x 2 doses, IV Solu-Medrol  125 mg x 1 and IV mag 2 g x 1. Initial vitals were BP 190/99, HR 120, RR 30s  ED Course: Initial vitals show temp 98.7, RR 20, HR 112, BP 174/117, SpO2 100% on 2 L. Initial labs significant for K+ 3.3, BNP 108, WBC 7.8, Hgb 12.6, normal kidney function and normal D-dimer.  EKG shows sinus tach with PVCs.  Chest x-ray with no acute abnormalities.  Pt received multiple DuoNebs, IV hydralazine  10 mg and home BP meds for severely elevated BP with SBP in the 200s. TRH was consulted for admission.   Review of Systems: Please see HPI for pertinent positives and negatives. A complete 10 system review of systems are otherwise negative.  Past Medical History:  Diagnosis Date   Acute sinusitis 01/09/2019   Acute sinusitis 01/09/2019   Anxiety    Arthritis    fingers, knees (08/16/2018)   Asthma    Atopic dermatitis 03/20/2018   Axillary hidradenitis suppurativa 10/13/2018   Cancer of right breast (HCC) 1991   s/p lumpectomy, chemotherapy  and radiation therapy in 1991. Mammogram in 2007 was normal.   Cellulitis of buttock, left 12/13/2022   Constipated    h/o   COPD (chronic obstructive pulmonary disease) (HCC)    History of multiple hospital admissions for exercabation    COPD exacerbation (HCC) 01/01/2019   COPD exacerbation (HCC) 02/17/2024   COPD with acute exacerbation (HCC) 01/14/2019   COPD with exacerbation (HCC) 04/06/2009   Qualifier: Diagnosis of  By: Derald Flattery MD, Vijay     Depression    Diarrhea    h/o   Facial cellulitis 08/30/2022   Facial edema 08/31/2022   GERD (gastroesophageal reflux disease)    Grade I diastolic dysfunction 01/12/2024   Headache    a few times/month (08/16/2018   Heart murmur 10/05/2011   first time I ever heard I had one was today   History of breast cancer 12/01/2012   Pt with h/o breast CA s/p lumpectomy with chemo/radiation in 1991. Pt mammogram 2011 was unremarkable. Had CT chest 10/2012 in ED for SOB and showed spiculated nodule with lymph node. 12/04/12: Birads 2; repeat diagnostic mammogram in 1 year.     Hyperlipidemia    Hypertension    Lower extremity edema 09/21/2018   Obesity    Personal history of chemotherapy    Personal history of radiation therapy    Pneumonia    couple times in the last 10-15 yrs (08/16/2018)   QT prolongation 08/08/2014   Seasonal allergies 02/25/2017  Shortness of breath 10/05/2011   at rest; lying down; w/exertion   Sigmoid diverticulitis 80/2008   Tobacco abuse    Type 2 diabetes mellitus (HCC) 05/14/2009   Type II diabetes mellitus (HCC)    Past Surgical History:  Procedure Laterality Date   ABDOMINAL HYSTERECTOMY     ANTERIOR CERVICAL DECOMP/DISCECTOMY FUSION  2012   Dr. Jackee Marus  put plate in; did something to my vertebrae   BACK SURGERY     BREAST LUMPECTOMY Right 1991   DOBUTAMINE  STRESS ECHO  08/2004   Inferior ischemia, normal LV systolic function, no significant CAD   MINOR IRRIGATION AND DEBRIDEMENT OF WOUND  Right 09/04/2022   Procedure: IRRIGATION AND DEBRIDEMENT OF NASAL WOUND;  Surgeon: Rush Coupe, MD;  Location: [facility] OR;  Service: ENT;  Laterality: Right;   Social History:  reports that she has been smoking cigarettes. She has a 22.5 pack-year smoking history. She has never used smokeless tobacco. She reports current alcohol  use of about 4.0 standard drinks of alcohol  per week. She reports that she does not use drugs.  Allergies  Allergen Reactions   Ace Inhibitors Anaphylaxis, Swelling and Other (See Comments)    Throat swelling  (Re: Lisinopril -hydrochlorothiazide )  Pt questioned if she is allergic in 2025.    Family History  Problem Relation Age of Onset   Cancer Mother        unknown   Breast cancer Daughter 37   Breast cancer Daughter        20s   BRCA 1/2 Neg Hx      Prior to Admission medications   Medication Sig Start Date End Date Taking? Authorizing Provider  metoprolol  tartrate (LOPRESSOR ) 25 MG tablet Take 1 tablet (25 mg total) by mouth 2 (two) times daily. 03/19/24 03/19/25 Yes Jackolyn Masker, MD  acetaminophen  (TYLENOL ) 325 MG tablet Take 2 tablets (650 mg total) by mouth every 6 (six) hours as needed for mild pain (or Fever >/= 101). 08/23/22   Joette Mustard, MD  albuterol  (PROVENTIL ) (2.5 MG/3ML) 0.083% nebulizer solution INHALE 1 VIAL VIA NEBULIZER EVERY 4 HOURS AS NEEDED FOR SHORTNESS OF BREATH OR WHEEZING 03/13/24   Cathey Clunes, MD  albuterol  (VENTOLIN  HFA) 108 (90 Base) MCG/ACT inhaler Inhale 2 puffs into the lungs every 6 (six) hours as needed for wheezing or shortness of breath. 01/10/24   Cleven Dallas, DO  amLODipine  (NORVASC ) 10 MG tablet Take 1 tablet (10 mg total) by mouth daily. 03/19/24 03/19/25  Jackolyn Masker, MD  Fluticasone -Umeclidin-Vilant (TRELEGY ELLIPTA ) 100-62.5-25 MCG/ACT AEPB Inhale 1 puff into the lungs daily. 03/19/24 05/18/24  Jackolyn Masker, MD  losartan -hydrochlorothiazide  (HYZAAR) 50-12.5 MG tablet Take 1 tablet by mouth 2 (two) times  daily. 03/20/24   Cathey Clunes, MD    Physical Exam: BP (!) 169/91   Pulse (!) 104   Temp 98.4 F (36.9 C) (Oral)   Resp (!) 21   Ht 5' 6 (1.676 m)   Wt 71.5 kg   SpO2 99%   BMI 25.44 kg/m  General: Pleasant, acutely ill appearing elderly woman laying in bed. No acute distress. HEENT: [place]/AT. Anicteric sclera. Dry mucous membrane. CV: Tachycardic. Regular rhythm. No murmurs, rubs, or gallops. No LE edema Pulmonary: Tachypneic.  On 2 L [place] lungs CTAB. Normal effort.  Mild expiratory wheezes in the upper lung fields. Abdominal: Soft, nontender, nondistended. Normal bowel sounds. Extremities: Palpable radial and DP pulses. Normal ROM. Skin: Warm and dry. No obvious rash or lesions. Neuro: A&Ox3. Moves all extremities. Normal sensation  to light touch. No focal deficit. Psych: Normal mood and affect          Labs on Admission:  Basic Metabolic Panel: Recent Labs  Lab 04/18/24 1809  NA 139  K 3.3*  CL 106  CO2 23  GLUCOSE 126*  BUN 7*  CREATININE 0.74  CALCIUM  8.6*   Liver Function Tests: No results for input(s): AST, ALT, ALKPHOS, BILITOT, PROT, ALBUMIN in the last 168 hours. No results for input(s): LIPASE, AMYLASE in the last 168 hours. No results for input(s): AMMONIA in the last 168 hours. CBC: Recent Labs  Lab 04/18/24 1809  WBC 7.8  NEUTROABS 5.2  HGB 12.6  HCT 38.6  MCV 91.5  PLT 283   Cardiac Enzymes: No results for input(s): CKTOTAL, CKMB, CKMBINDEX, TROPONINI in the last 168 hours. BNP (last 3 results) Recent Labs    01/12/24 0830 02/17/24 1633 04/18/24 1809  BNP 68.9 59.5 108.1*    ProBNP (last 3 results) No results for input(s): PROBNP in the last 8760 hours.  CBG: No results for input(s): GLUCAP in the last 168 hours.  Radiological Exams on Admission: DG Chest Portable 1 View Result Date: 04/18/2024 CLINICAL DATA:  Dyspnea. EXAM: PORTABLE CHEST 1 VIEW COMPARISON:  February 17, 2024. FINDINGS: The  heart size and mediastinal contours are within normal limits. Both lungs are clear. Old right rib fractures are noted. IMPRESSION: No active disease. Electronically Signed   By: Rosalene Colon M.D.   On: 04/18/2024 18:04   Assessment/Plan CAMELLE HENKELS is a 73 y.o. female with medical history significant for COPD, HTN, HLD and depression who presents to the ED for evaluation of shortness of breath and wheezing and admitted for COPD exacerbation  # COPD with acute exacerbation # Acute hypoxic respiratory failure - Presented with 2 days of shortness of breath, wheezing and cough - No evidence of respiratory infection or systemic signs of infection - New O2 requirements in the setting of COPD exacerbation - Prednisone  40 mg daily - Start azithromycin  500 mg daily x 3 - IV NS at 100 cc/h for 10 hours - Scheduled DuoNebs - Continue home bronchodilator - Incentive spirometer, flutter valve  # Hypertensive urgency - Found to have significantly elevated BP with SBP in the 170s to 200s - This is in the setting of patient not taking her home BP meds - Resume home amlodipine , HCTZ, losartan  and Lopressor   # Tobacco use disorder - Reports smoking 1 pack/day of cigarettes - Interested in quitting at some point  Smoking cessation counseling for 4 minutes today, patient agreeable to nicotine  patch  I have discussed tobacco cessation with the patient.  I have counseled the patient regarding the negative impacts of continued tobacco use including but not limited to lung cancer, COPD, and cardiovascular disease.  I have discussed alternatives to tobacco and modalities that may help facilitate tobacco cessation including but not limited to biofeedback, hypnosis, and medications.  Total time spent with tobacco counseling was 4 minutes.   DVT prophylaxis: Lovenox      Code Status: Full Code  Consults called: None  Family Communication: No family at bedside  Severity of Illness: The  appropriate patient status for this patient is OBSERVATION. Observation status is judged to be reasonable and necessary in order to provide the required intensity of service to ensure the patient's safety. The patient's presenting symptoms, physical exam findings, and initial radiographic and laboratory data in the context of their medical condition is felt to place them  at decreased risk for further clinical deterioration. Furthermore, it is anticipated that the patient will be medically stable for discharge from the hospital within 2 midnights of admission.   Level of care: Telemetry   This record has been created using Conservation officer, historic buildings. Errors have been sought and corrected, but may not always be located. Such creation errors do not reflect on the standard of care.   Vita Grip, MD 04/18/2024, 11:13 PM [facility] Pager: [phone] Isaiah 41:10   If 7PM-7AM, please contact night-coverage [email or web address] Password [email or web address]

## 2024-04-18 NOTE — ED Triage Notes (Addendum)
 BIB EMS from home for shortness of breath and new cough, wheezing onset 2 days ago. Patient awake, alert and oriented. Has hx of intubation for shortness of breath.   Albuterol  15 mg (3 doses) Atrovent  1 mg (2 doses) Solu-medrol  125 mg IV Magnesium  2mg  IV drip G20 left forearm BP=190/99 HR=120 RR=30 Saturation=92% (room air)

## 2024-04-18 NOTE — ED Notes (Signed)
 Pt given meal per request and provider approval

## 2024-04-19 ENCOUNTER — Inpatient Hospital Stay (HOSPITAL_COMMUNITY)

## 2024-04-19 DIAGNOSIS — E785 Hyperlipidemia, unspecified: Secondary | ICD-10-CM | POA: Diagnosis present

## 2024-04-19 DIAGNOSIS — I160 Hypertensive urgency: Secondary | ICD-10-CM

## 2024-04-19 DIAGNOSIS — Z6825 Body mass index (BMI) 25.0-25.9, adult: Secondary | ICD-10-CM | POA: Diagnosis not present

## 2024-04-19 DIAGNOSIS — J441 Chronic obstructive pulmonary disease with (acute) exacerbation: Secondary | ICD-10-CM | POA: Diagnosis present

## 2024-04-19 DIAGNOSIS — Z79899 Other long term (current) drug therapy: Secondary | ICD-10-CM | POA: Diagnosis not present

## 2024-04-19 DIAGNOSIS — F17210 Nicotine dependence, cigarettes, uncomplicated: Secondary | ICD-10-CM | POA: Diagnosis present

## 2024-04-19 DIAGNOSIS — R9431 Abnormal electrocardiogram [ECG] [EKG]: Secondary | ICD-10-CM | POA: Diagnosis not present

## 2024-04-19 DIAGNOSIS — E669 Obesity, unspecified: Secondary | ICD-10-CM | POA: Diagnosis present

## 2024-04-19 DIAGNOSIS — Z853 Personal history of malignant neoplasm of breast: Secondary | ICD-10-CM | POA: Diagnosis not present

## 2024-04-19 DIAGNOSIS — Z981 Arthrodesis status: Secondary | ICD-10-CM | POA: Diagnosis not present

## 2024-04-19 DIAGNOSIS — J9601 Acute respiratory failure with hypoxia: Secondary | ICD-10-CM | POA: Diagnosis present

## 2024-04-19 DIAGNOSIS — Z9221 Personal history of antineoplastic chemotherapy: Secondary | ICD-10-CM | POA: Diagnosis not present

## 2024-04-19 DIAGNOSIS — I10 Essential (primary) hypertension: Secondary | ICD-10-CM | POA: Diagnosis present

## 2024-04-19 DIAGNOSIS — Z888 Allergy status to other drugs, medicaments and biological substances status: Secondary | ICD-10-CM | POA: Diagnosis not present

## 2024-04-19 DIAGNOSIS — Z923 Personal history of irradiation: Secondary | ICD-10-CM | POA: Diagnosis not present

## 2024-04-19 DIAGNOSIS — Z803 Family history of malignant neoplasm of breast: Secondary | ICD-10-CM | POA: Diagnosis not present

## 2024-04-19 DIAGNOSIS — I493 Ventricular premature depolarization: Secondary | ICD-10-CM | POA: Diagnosis present

## 2024-04-19 DIAGNOSIS — Z90710 Acquired absence of both cervix and uterus: Secondary | ICD-10-CM | POA: Diagnosis not present

## 2024-04-19 DIAGNOSIS — E119 Type 2 diabetes mellitus without complications: Secondary | ICD-10-CM | POA: Diagnosis present

## 2024-04-19 DIAGNOSIS — K219 Gastro-esophageal reflux disease without esophagitis: Secondary | ICD-10-CM | POA: Diagnosis present

## 2024-04-19 LAB — CBC
HCT: 40.5 % (ref 36.0–46.0)
Hemoglobin: 12.9 g/dL (ref 12.0–15.0)
MCH: 29.6 pg (ref 26.0–34.0)
MCHC: 31.9 g/dL (ref 30.0–36.0)
MCV: 92.9 fL (ref 80.0–100.0)
Platelets: 303 10*3/uL (ref 150–400)
RBC: 4.36 MIL/uL (ref 3.87–5.11)
RDW: 14.1 % (ref 11.5–15.5)
WBC: 3.3 10*3/uL — ABNORMAL LOW (ref 4.0–10.5)
nRBC: 0 % (ref 0.0–0.2)

## 2024-04-19 LAB — BASIC METABOLIC PANEL WITH GFR
Anion gap: 12 (ref 5–15)
BUN: 10 mg/dL (ref 8–23)
CO2: 21 mmol/L — ABNORMAL LOW (ref 22–32)
Calcium: 9.2 mg/dL (ref 8.9–10.3)
Chloride: 103 mmol/L (ref 98–111)
Creatinine, Ser: 0.62 mg/dL (ref 0.44–1.00)
GFR, Estimated: >60 mL/min (ref >60)
Glucose, Bld: 152 mg/dL — ABNORMAL HIGH (ref 70–99)
Potassium: 4 mmol/L (ref 3.5–5.1)
Sodium: 136 mmol/L (ref 135–145)

## 2024-04-19 LAB — RESPIRATORY PANEL BY PCR

## 2024-04-19 LAB — ECHOCARDIOGRAM COMPLETE
Area-P 1/2: 4.21 cm2
Est EF: >75
Height: 66 in
S' Lateral: 2 cm
Weight: 2522.06 [oz_av]

## 2024-04-19 LAB — MAGNESIUM: Magnesium: 2 mg/dL (ref 1.7–2.4)

## 2024-04-19 MED ORDER — HYDROCHLOROTHIAZIDE 12.5 MG PO TABS
12.5000 mg | ORAL_TABLET | Freq: Every day | ORAL | Status: DC
  Administered 2024-04-19: 12.5 mg via ORAL
  Filled 2024-04-19: qty 1

## 2024-04-19 MED ORDER — POTASSIUM CHLORIDE CRYS ER 20 MEQ PO TBCR
40.0000 meq | EXTENDED_RELEASE_TABLET | Freq: Once | ORAL | Status: AC
  Administered 2024-04-19: 40 meq via ORAL
  Filled 2024-04-19: qty 2

## 2024-04-19 MED ORDER — SODIUM CHLORIDE 0.9 % IV SOLN: INTRAVENOUS | Status: DC

## 2024-04-19 MED ORDER — METOPROLOL TARTRATE 25 MG PO TABS
25.0000 mg | ORAL_TABLET | Freq: Two times a day (BID) | ORAL | Status: DC
  Administered 2024-04-19 (×2): 25 mg via ORAL
  Filled 2024-04-19 (×2): qty 1

## 2024-04-19 MED ORDER — TRAZODONE HCL 50 MG PO TABS
50.0000 mg | ORAL_TABLET | Freq: Every evening | ORAL | Status: DC | PRN
  Administered 2024-04-19: 50 mg via ORAL
  Filled 2024-04-19: qty 1

## 2024-04-19 MED ORDER — IPRATROPIUM-ALBUTEROL 0.5-2.5 (3) MG/3ML IN SOLN
3.0000 mL | Freq: Once | RESPIRATORY_TRACT | Status: AC | PRN
  Administered 2024-04-19: 3 mL via RESPIRATORY_TRACT
  Filled 2024-04-19: qty 3

## 2024-04-19 MED ORDER — LOSARTAN POTASSIUM-HCTZ 50-12.5 MG PO TABS: 1.0000 | ORAL_TABLET | Freq: Two times a day (BID) | ORAL | Status: DC

## 2024-04-19 MED ORDER — BUDESON-GLYCOPYRROL-FORMOTEROL 160-9-4.8 MCG/ACT IN AERO
2.0000 | INHALATION_SPRAY | Freq: Two times a day (BID) | RESPIRATORY_TRACT | Status: DC
  Administered 2024-04-19 – 2024-04-22 (×7): 2 via RESPIRATORY_TRACT
  Filled 2024-04-19: qty 5.9

## 2024-04-19 MED ORDER — METHYLPREDNISOLONE SODIUM SUCC 125 MG IJ SOLR
125.0000 mg | Freq: Every day | INTRAMUSCULAR | Status: DC
  Administered 2024-04-19: 125 mg via INTRAVENOUS
  Filled 2024-04-19: qty 2

## 2024-04-19 MED ORDER — GUAIFENESIN-DM 100-10 MG/5ML PO SYRP
10.0000 mL | ORAL_SOLUTION | ORAL | Status: DC | PRN
  Administered 2024-04-19 (×3): 10 mL via ORAL
  Filled 2024-04-19 (×3): qty 10

## 2024-04-19 MED ORDER — ALBUTEROL SULFATE (2.5 MG/3ML) 0.083% IN NEBU
2.5000 mg | INHALATION_SOLUTION | RESPIRATORY_TRACT | Status: DC | PRN
  Administered 2024-04-20 (×2): 2.5 mg via RESPIRATORY_TRACT
  Filled 2024-04-19 (×3): qty 3

## 2024-04-19 MED ORDER — LABETALOL HCL 5 MG/ML IV SOLN
10.0000 mg | Freq: Once | INTRAVENOUS | Status: AC
  Administered 2024-04-19: 10 mg via INTRAVENOUS
  Filled 2024-04-19: qty 4

## 2024-04-19 MED ORDER — SODIUM CHLORIDE 0.9 % IV SOLN
500.0000 mg | INTRAVENOUS | Status: AC
  Administered 2024-04-20 – 2024-04-21 (×2): 500 mg via INTRAVENOUS
  Filled 2024-04-19 (×3): qty 5

## 2024-04-19 MED ORDER — HYDROXYZINE HCL 10 MG PO TABS
10.0000 mg | ORAL_TABLET | Freq: Three times a day (TID) | ORAL | Status: DC | PRN
  Administered 2024-04-19 – 2024-04-20 (×2): 10 mg via ORAL
  Filled 2024-04-19 (×2): qty 1

## 2024-04-19 MED ORDER — AMLODIPINE BESYLATE 10 MG PO TABS
10.0000 mg | ORAL_TABLET | Freq: Every day | ORAL | Status: DC
  Administered 2024-04-19 – 2024-04-22 (×4): 10 mg via ORAL
  Filled 2024-04-19 (×4): qty 1

## 2024-04-19 MED ORDER — LOSARTAN POTASSIUM 50 MG PO TABS
50.0000 mg | ORAL_TABLET | Freq: Every day | ORAL | Status: DC
  Administered 2024-04-19: 50 mg via ORAL
  Filled 2024-04-19: qty 1

## 2024-04-19 MED ORDER — METHYLPREDNISOLONE SODIUM SUCC 40 MG IJ SOLR
40.0000 mg | Freq: Two times a day (BID) | INTRAMUSCULAR | Status: DC
  Administered 2024-04-20 – 2024-04-21 (×5): 40 mg via INTRAVENOUS
  Filled 2024-04-19 (×5): qty 1

## 2024-04-19 NOTE — Plan of Care (Signed)
  Problem: Education: Goal: Knowledge of General Education information will improve Description: Including pain rating scale, medication(s)/side effects and non-pharmacologic comfort measures Outcome: Progressing   Problem: Clinical Measurements: Goal: Will remain free from infection Outcome: Progressing   Problem: Safety: Goal: Ability to remain free from injury will improve Outcome: Progressing   Problem: Education: Goal: Knowledge of disease or condition will improve Outcome: Progressing   Problem: Respiratory: Goal: Levels of oxygenation will improve Outcome: Progressing Goal: Ability to maintain adequate ventilation will improve Outcome: Progressing

## 2024-04-19 NOTE — Telephone Encounter (Signed)
 Patient in [facility]  [name]  [facility]  [facility], [facility] Guide  Direct Dial: [phone]  Fax [phone]

## 2024-04-19 NOTE — Progress Notes (Signed)
 Mobility Specialist - Progress Note   04/19/24 1115  Mobility  Activity Ambulated independently in hallway  Level of Assistance Independent  Assistive Device None  Distance Ambulated (ft) 275 ft  Activity Response Tolerated well  Mobility Referral Yes  Mobility visit 1 Mobility  Mobility Specialist Start Time (ACUTE ONLY) 1053  Mobility Specialist Stop Time (ACUTE ONLY) 1114  Mobility Specialist Time Calculation (min) (ACUTE ONLY) 21 min   Pt received in bed and agreeable to mobility. No complaints during session. Pt to bed after session with all needs met.    Pre-mobility: 100% SpO2 (2L [place]) During mobility: 100% SpO2 (2L [employer]) Post-mobility: 100% SPO2 (2L [place])  Maya Loukisha Gunnerson Mobility Specialist

## 2024-04-19 NOTE — Progress Notes (Signed)
 PROGRESS NOTE    [name]  WUJ:[id number] DOB: 1951-10-13 DOA: 04/18/2024 PCP: Cathey Clunes, MD    Brief Narrative:  [name] is a 73 y.o. female with medical history significant for COPD, HTN, HLD and depression who presents to the ED for evaluation of shortness of breath and wheezing and admitted for COPD exacerbation   Assessment and Plan: COPD with acute exacerbation with Acute hypoxic respiratory failure - Presented with 2 days of shortness of breath, wheezing and cough - NP swab - New O2 requirements in the setting of COPD exacerbation-wean as able - Will use IV steroids - Start azithromycin  500 mg daily x 3 - Scheduled DuoNebs - Continue home bronchodilator - Incentive spirometer, flutter valve   Hypertensive urgency - Found to have significantly elevated BP with SBP in the 170s to 200s - This is in the setting of patient not taking her home BP meds - Resume home amlodipine , HCTZ, losartan  and Lopressor    Tobacco use disorder - Reports smoking 1 pack/day of cigarettes    DVT prophylaxis: enoxaparin  (LOVENOX ) injection 40 mg Start: 04/19/24 1000    Code Status: Full Code   Disposition Plan:  Level of care: Telemetry Status is: Observation     Consultants:  None   Subjective: Complaining of continued shortness of breath, appears patient has not gotten steroids yet today as they were not ordered  Objective: Vitals:   04/18/24 2222 04/18/24 2319 04/19/24 0426 04/19/24 0814  BP: (!) 169/91 103/79 (!) 141/66   Pulse: (!) 104 94 82   Resp:  20 18   Temp:  98.5 F (36.9 C) 98.6 F (37 C)   TempSrc:  Oral Oral   SpO2: 99% 100% 100% 98%  Weight:      Height:        Intake/Output Summary (Last 24 hours) at 04/19/2024 9562 Last data filed at 04/19/2024 0700 Gross per 24 hour  Intake 600 ml  Output --  Net 600 ml   Filed Weights   04/18/24 1744  Weight: 71.5 kg    Examination:   General: Appearance:     Overweight  female with appearance of dyspnea     Lungs:   Diminished with expiratory wheezing-increased work of breathing  Heart:    Normal heart rate.     MS:   All extremities are intact.    Neurologic:   Awake, alert       Data Reviewed: I have personally reviewed following labs and imaging studies  CBC: Recent Labs  Lab 04/18/24 1809 04/19/24 0544  WBC 7.8 3.3*  NEUTROABS 5.2  --   HGB 12.6 12.9  HCT 38.6 40.5  MCV 91.5 92.9  PLT 283 303   Basic Metabolic Panel: Recent Labs  Lab 04/18/24 1809 04/19/24 0544  NA 139 136  K 3.3* 4.0  CL 106 103  CO2 23 21*  GLUCOSE 126* 152*  BUN 7* 10  CREATININE 0.74 0.62  CALCIUM  8.6* 9.2   GFR: Estimated Creatinine Clearance: 63.5 mL/min (by C-G formula based on SCr of 0.62 mg/dL). Liver Function Tests: No results for input(s): AST, ALT, ALKPHOS, BILITOT, PROT, ALBUMIN in the last 168 hours. No results for input(s): LIPASE, AMYLASE in the last 168 hours. No results for input(s): AMMONIA in the last 168 hours. Coagulation Profile: No results for input(s): INR, PROTIME in the last 168 hours. Cardiac Enzymes: No results for input(s): CKTOTAL, CKMB, CKMBINDEX, TROPONINI in the last 168 hours. BNP (last 3 results) No  results for input(s): PROBNP in the last 8760 hours. HbA1C: No results for input(s): HGBA1C in the last 72 hours. CBG: No results for input(s): GLUCAP in the last 168 hours. Lipid Profile: No results for input(s): CHOL, HDL, LDLCALC, TRIG, CHOLHDL, LDLDIRECT in the last 72 hours. Thyroid  Function Tests: No results for input(s): TSH, T4TOTAL, FREET4, T3FREE, THYROIDAB in the last 72 hours. Anemia Panel: No results for input(s): VITAMINB12, FOLATE, FERRITIN, TIBC, IRON, RETICCTPCT in the last 72 hours. Sepsis Labs: No results for input(s): PROCALCITON, LATICACIDVEN in the last 168 hours.  No results found for this or any previous visit (from the  past 240 hours).       Radiology Studies: DG Chest Portable 1 View Result Date: 04/18/2024 CLINICAL DATA:  Dyspnea. EXAM: PORTABLE CHEST 1 VIEW COMPARISON:  February 17, 2024. FINDINGS: The heart size and mediastinal contours are within normal limits. Both lungs are clear. Old right rib fractures are noted. IMPRESSION: No active disease. Electronically Signed   By: Rosalene Colon M.D.   On: 04/18/2024 18:04        Scheduled Meds:  amLODipine   10 mg Oral Daily   budesonide -glycopyrrolate-formoterol   2 puff Inhalation BID   enoxaparin  (LOVENOX ) injection  40 mg Subcutaneous Q24H   losartan   50 mg Oral Daily   And   hydrochlorothiazide   12.5 mg Oral Daily   ipratropium-albuterol   3 mL Nebulization QID   metoprolol  tartrate  25 mg Oral BID   nicotine   21 mg Transdermal Daily   Continuous Infusions:  azithromycin  250 mL/hr at 04/19/24 0156     LOS: 0 days    Time spent: 45 minutes spent on chart review, discussion with nursing staff, consultants, updating family and interview/physical exam; more than 50% of that time was spent in counseling and/or coordination of care.    Enrigue Harvard, DO [facility] Available via Epic secure chat 7am-7pm After these hours, please refer to coverage provider listed on [email or web address] 04/19/2024, 9:21 AM

## 2024-04-19 NOTE — Progress Notes (Signed)
   04/19/24 1113  TOC Brief Assessment  Insurance and Status Reviewed  Patient has primary care physician Yes  Home environment has been reviewed single family home  Prior level of function: independent  Prior/Current Home Services No current home services  Social Drivers of Health Review SDOH reviewed no interventions necessary  Readmission risk has been reviewed Yes  Transition of care needs transition of care needs identified, TOC will continue to follow    [place], MSW, LCSW 04/19/2024 11:13 AM

## 2024-04-19 NOTE — Plan of Care (Signed)

## 2024-04-20 DIAGNOSIS — J441 Chronic obstructive pulmonary disease with (acute) exacerbation: Secondary | ICD-10-CM | POA: Diagnosis not present

## 2024-04-20 LAB — BASIC METABOLIC PANEL WITH GFR
Anion gap: 14 (ref 5–15)
BUN: 13 mg/dL (ref 8–23)
CO2: 21 mmol/L — ABNORMAL LOW (ref 22–32)
Calcium: 9.5 mg/dL (ref 8.9–10.3)
Chloride: 104 mmol/L (ref 98–111)
Creatinine, Ser: 0.73 mg/dL (ref 0.44–1.00)
GFR, Estimated: >60 mL/min (ref >60)
Glucose, Bld: 153 mg/dL — ABNORMAL HIGH (ref 70–99)
Potassium: 4.4 mmol/L (ref 3.5–5.1)
Sodium: 139 mmol/L (ref 135–145)

## 2024-04-20 LAB — CBC
HCT: 42.5 % (ref 36.0–46.0)
Hemoglobin: 13.8 g/dL (ref 12.0–15.0)
MCH: 29.9 pg (ref 26.0–34.0)
MCHC: 32.5 g/dL (ref 30.0–36.0)
MCV: 92.2 fL (ref 80.0–100.0)
Platelets: 286 10*3/uL (ref 150–400)
RBC: 4.61 MIL/uL (ref 3.87–5.11)
RDW: 14.2 % (ref 11.5–15.5)
WBC: 5.9 10*3/uL (ref 4.0–10.5)
nRBC: 0 % (ref 0.0–0.2)

## 2024-04-20 MED ORDER — METOPROLOL TARTRATE 50 MG PO TABS
50.0000 mg | ORAL_TABLET | Freq: Two times a day (BID) | ORAL | Status: DC
  Administered 2024-04-20 – 2024-04-22 (×5): 50 mg via ORAL
  Filled 2024-04-20 (×5): qty 1

## 2024-04-20 MED ORDER — HYDROCHLOROTHIAZIDE 12.5 MG PO TABS
12.5000 mg | ORAL_TABLET | Freq: Every day | ORAL | Status: DC
  Administered 2024-04-20 – 2024-04-22 (×3): 12.5 mg via ORAL
  Filled 2024-04-20 (×3): qty 1

## 2024-04-20 MED ORDER — GUAIFENESIN ER 600 MG PO TB12
600.0000 mg | ORAL_TABLET | Freq: Two times a day (BID) | ORAL | Status: DC
  Administered 2024-04-20 – 2024-04-22 (×5): 600 mg via ORAL
  Filled 2024-04-20 (×5): qty 1

## 2024-04-20 MED ORDER — LOSARTAN POTASSIUM 50 MG PO TABS
100.0000 mg | ORAL_TABLET | Freq: Every day | ORAL | Status: DC
  Administered 2024-04-20 – 2024-04-22 (×3): 100 mg via ORAL
  Filled 2024-04-20 (×3): qty 2

## 2024-04-20 NOTE — Plan of Care (Signed)
  Problem: Education: Goal: Knowledge of General Education information will improve Description: Including pain rating scale, medication(s)/side effects and non-pharmacologic comfort measures Outcome: Progressing   Problem: Clinical Measurements: Goal: Ability to maintain clinical measurements within normal limits will improve Outcome: Progressing Goal: Will remain free from infection Outcome: Progressing Goal: Diagnostic test results will improve Outcome: Progressing   Problem: Activity: Goal: Risk for activity intolerance will decrease Outcome: Progressing   Problem: Nutrition: Goal: Adequate nutrition will be maintained Outcome: Progressing

## 2024-04-20 NOTE — Plan of Care (Signed)

## 2024-04-20 NOTE — Plan of Care (Signed)
  Problem: Nutrition: Goal: Adequate nutrition will be maintained Outcome: Progressing   Problem: Safety: Goal: Ability to remain free from injury will improve Outcome: Progressing   

## 2024-04-20 NOTE — Progress Notes (Signed)
 PROGRESS NOTE    [name]  ZOX:[id number] DOB: [birth year] DOA: 04/18/2024 PCP: Cathey Clunes, MD    Brief Narrative:  [name] is a 73 y.o. female with medical history significant for COPD, HTN, HLD and depression who presents to the ED for evaluation of shortness of breath and wheezing and admitted for COPD exacerbation.  Continues to smoke  Assessment and Plan: COPD with acute exacerbation with Acute hypoxic respiratory failure - Presented with 2 days of shortness of breath, wheezing and cough - NP swab negative - New O2 requirements in the setting of COPD exacerbation-wean as able to room air - Will use IV steroids - Start azithromycin  500 mg daily x 3 - Scheduled DuoNebs - Continue home bronchodilator - Incentive spirometer, flutter valve -Out of bed and ambulating -Check echo   Hypertensive urgency - Found to have significantly elevated BP with SBP in the 170s to 200s - This is in the setting of patient not taking her home BP meds - Resume home amlodipine , HCTZ, losartan  and Lopressor --adjust for better control   Tobacco use disorder - Reports smoking 1 pack/day of cigarettes    DVT prophylaxis: enoxaparin  (LOVENOX ) injection 40 mg Start: 04/19/24 1000    Code Status: Full Code   Disposition Plan:  Level of care: Telemetry      Consultants:  None   Subjective: Continues to cough and feels shortness of breath but improved from admission  Objective: Vitals:   04/19/24 1144 04/19/24 1543 04/19/24 1939 04/20/24 0743  BP:   (!) 187/76   Pulse:   82   Resp:   18   Temp:   98 F (36.7 C)   TempSrc:      SpO2: 99% 98% 99% 99%  Weight:      Height:        Intake/Output Summary (Last 24 hours) at 04/20/2024 1045 Last data filed at 04/20/2024 0504 Gross per 24 hour  Intake 431.76 ml  Output --  Net 431.76 ml   Filed Weights   04/18/24 1744  Weight: 71.5 kg    Examination:   General: Appearance:     Overweight female  who appears comfortable     Lungs:   Diminished with expiratory wheezing-dry cough  Heart:    Normal heart rate.     MS:   All extremities are intact.    Neurologic:   Awake, alert       Data Reviewed: I have personally reviewed following labs and imaging studies  CBC: Recent Labs  Lab 04/18/24 1809 04/19/24 0544 04/20/24 0506  WBC 7.8 3.3* 5.9  NEUTROABS 5.2  --   --   HGB 12.6 12.9 13.8  HCT 38.6 40.5 42.5  MCV 91.5 92.9 92.2  PLT 283 303 286   Basic Metabolic Panel: Recent Labs  Lab 04/18/24 1809 04/19/24 0544 04/19/24 0554 04/20/24 0506  NA 139 136  --  139  K 3.3* 4.0  --  4.4  CL 106 103  --  104  CO2 23 21*  --  21*  GLUCOSE 126* 152*  --  153*  BUN 7* 10  --  13  CREATININE 0.74 0.62  --  0.73  CALCIUM  8.6* 9.2  --  9.5  MG  --   --  2.0  --    GFR: Estimated Creatinine Clearance: 63.5 mL/min (by C-G formula based on SCr of 0.73 mg/dL). Liver Function Tests: No results for input(s): AST, ALT, ALKPHOS, BILITOT, PROT, ALBUMIN in  the last 168 hours. No results for input(s): LIPASE, AMYLASE in the last 168 hours. No results for input(s): AMMONIA in the last 168 hours. Coagulation Profile: No results for input(s): INR, PROTIME in the last 168 hours. Cardiac Enzymes: No results for input(s): CKTOTAL, CKMB, CKMBINDEX, TROPONINI in the last 168 hours. BNP (last 3 results) No results for input(s): PROBNP in the last 8760 hours. HbA1C: No results for input(s): HGBA1C in the last 72 hours. CBG: No results for input(s): GLUCAP in the last 168 hours. Lipid Profile: No results for input(s): CHOL, HDL, LDLCALC, TRIG, CHOLHDL, LDLDIRECT in the last 72 hours. Thyroid  Function Tests: No results for input(s): TSH, T4TOTAL, FREET4, T3FREE, THYROIDAB in the last 72 hours. Anemia Panel: No results for input(s): VITAMINB12, FOLATE, FERRITIN, TIBC, IRON, RETICCTPCT in the last 72 hours. Sepsis  Labs: No results for input(s): PROCALCITON, LATICACIDVEN in the last 168 hours.  Recent Results (from the past 240 hours)  Respiratory (~20 pathogens) panel by PCR     Status: None   Collection Time: 04/19/24  8:16 AM   Specimen: Nasopharyngeal Swab; Respiratory  Result Value Ref Range Status   Adenovirus NOT DETECTED NOT DETECTED Final   Coronavirus 229E NOT DETECTED NOT DETECTED Final    Comment: (NOTE) The Coronavirus on the Respiratory Panel, DOES NOT test for the novel  Coronavirus (2019 nCoV)    Coronavirus HKU1 NOT DETECTED NOT DETECTED Final   Coronavirus NL63 NOT DETECTED NOT DETECTED Final   Coronavirus OC43 NOT DETECTED NOT DETECTED Final   Metapneumovirus NOT DETECTED NOT DETECTED Final   Rhinovirus / Enterovirus NOT DETECTED NOT DETECTED Final   Influenza A NOT DETECTED NOT DETECTED Final   Influenza B NOT DETECTED NOT DETECTED Final   Parainfluenza Virus 1 NOT DETECTED NOT DETECTED Final   Parainfluenza Virus 2 NOT DETECTED NOT DETECTED Final   Parainfluenza Virus 3 NOT DETECTED NOT DETECTED Final   Parainfluenza Virus 4 NOT DETECTED NOT DETECTED Final   Respiratory Syncytial Virus NOT DETECTED NOT DETECTED Final   Bordetella pertussis NOT DETECTED NOT DETECTED Final   Bordetella Parapertussis NOT DETECTED NOT DETECTED Final   Chlamydophila pneumoniae NOT DETECTED NOT DETECTED Final   Mycoplasma pneumoniae NOT DETECTED NOT DETECTED Final    Comment: Performed at [facility], [address], [place], [place] [address]         Radiology Studies: ECHOCARDIOGRAM COMPLETE Result Date: 04/19/2024    ECHOCARDIOGRAM REPORT   Patient Name:   [facility] Date of Exam: 04/19/2024 Medical Rec #:  [id number]          Height:       66.0 in Accession #:    [id number]         Weight:       157.6 lb Date of Birth:  04/29/1951          BSA:          1.807 m Patient Age:    73 years           BP:           194/74 mmHg Patient Gender: F                  HR:            87 bpm. Exam Location:  Inpatient Procedure: 2D Echo, Color Doppler and Cardiac Doppler (Both Spectral and Color            Flow Doppler were utilized  during procedure). Indications:    Abnormal ECG  History:        Patient has no prior history of Echocardiogram examinations.                 COPD; Risk Factors:Dyslipidemia and Hypertension.  Sonographer:    [name] Referring Phys: [id number] [name] U [name] IMPRESSIONS  1. Left ventricular ejection fraction, by estimation, is >75%. The left ventricle has hyperdynamic function. The left ventricle has no regional wall motion abnormalities. There is severe asymmetric left ventricular hypertrophy of the basal-septal segment. Left ventricular diastolic parameters are indeterminate.  2. Right ventricular systolic function is normal. The right ventricular size is normal. There is normal pulmonary artery systolic pressure.  3. The mitral valve is normal in structure. Trivial mitral valve regurgitation. No evidence of mitral stenosis.  4. The aortic valve is tricuspid. Aortic valve regurgitation is not visualized. No aortic stenosis is present.  5. The inferior vena cava is normal in size with greater than 50% respiratory variability, suggesting right atrial pressure of 3 mmHg. Comparison(s): Prior images unable to be directly viewed, comparison made by report only. FINDINGS  Left Ventricle: Left ventricular ejection fraction, by estimation, is >75%. The left ventricle has hyperdynamic function. The left ventricle has no regional wall motion abnormalities. The left ventricular internal cavity size was normal in size. There is severe asymmetric left ventricular hypertrophy of the basal-septal segment. Left ventricular diastolic parameters are indeterminate. Right Ventricle: The right ventricular size is normal. Right vetricular wall thickness was not well visualized. Right ventricular systolic function is normal. There is normal pulmonary artery systolic pressure. The tricuspid  regurgitant velocity is 2.48 m/s, and with an assumed right atrial pressure of 3 mmHg, the estimated right ventricular systolic pressure is 27.6 mmHg. Left Atrium: Left atrial size was normal in size. Right Atrium: Right atrial size was normal in size. Pericardium: There is no evidence of pericardial effusion. Mitral Valve: The mitral valve is normal in structure. Trivial mitral valve regurgitation. No evidence of mitral valve stenosis. Tricuspid Valve: The tricuspid valve is normal in structure. Tricuspid valve regurgitation is mild . No evidence of tricuspid stenosis. Aortic Valve: The aortic valve is tricuspid. Aortic valve regurgitation is not visualized. No aortic stenosis is present. Pulmonic Valve: The pulmonic valve was not well visualized. Pulmonic valve regurgitation is not visualized. No evidence of pulmonic stenosis. Aorta: The aortic root, ascending aorta, aortic arch and descending aorta are all structurally normal, with no evidence of dilitation or obstruction. Venous: The inferior vena cava is normal in size with greater than 50% respiratory variability, suggesting right atrial pressure of 3 mmHg. IAS/Shunts: The atrial septum is grossly normal.  LEFT VENTRICLE PLAX 2D LVIDd:         4.10 cm   Diastology LVIDs:         2.00 cm   LV e' medial:    5.55 cm/s LV PW:         1.10 cm   LV E/e' medial:  14.7 LV IVS:        2.10 cm   LV e' lateral:   6.53 cm/s LVOT diam:     2.00 cm   LV E/e' lateral: 12.5 LV SV:         111 LV SV Index:   62 LVOT Area:     3.14 cm  RIGHT VENTRICLE             IVC RV S prime:  10.60 cm/s  IVC diam: 1.60 cm TAPSE (M-mode): 1.8 cm LEFT ATRIUM             Index        RIGHT ATRIUM           Index LA diam:        3.10 cm 1.72 cm/m   RA Area:     11.90 cm LA Vol (A2C):   22.2 ml 12.28 ml/m  RA Volume:   25.70 ml  14.22 ml/m LA Vol (A4C):   40.9 ml 22.63 ml/m LA Biplane Vol: 32.6 ml 18.04 ml/m  AORTIC VALVE LVOT Vmax:   192.00 cm/s LVOT Vmean:  129.000 cm/s LVOT VTI:     0.354 m  AORTA Ao Root diam: 2.60 cm Ao Asc diam:  2.90 cm MITRAL VALVE                TRICUSPID VALVE MV Area (PHT): 4.21 cm     TR Peak grad:   24.6 mmHg MV Decel Time: 180 msec     TR Vmax:        248.00 cm/s MV E velocity: 81.50 cm/s MV A velocity: 135.00 cm/s  SHUNTS MV E/A ratio:  0.60         Systemic VTI:  0.35 m                             Systemic Diam: 2.00 cm Christie Donning MD Electronically signed by Christie Donning MD Signature Date/Time: 04/19/2024/7:58:53 PM    Final    DG Chest Portable 1 View Result Date: 04/18/2024 CLINICAL DATA:  Dyspnea. EXAM: PORTABLE CHEST 1 VIEW COMPARISON:  February 17, 2024. FINDINGS: The heart size and mediastinal contours are within normal limits. Both lungs are clear. Old right rib fractures are noted. IMPRESSION: No active disease. Electronically Signed   By: [name] M.D.   On: 04/18/2024 18:04        Scheduled Meds:  amLODipine   10 mg Oral Daily   budesonide -glycopyrrolate -formoterol   2 puff Inhalation BID   enoxaparin  (LOVENOX ) injection  40 mg Subcutaneous Q24H   guaiFENesin   600 mg Oral BID   losartan   100 mg Oral Daily   And   hydrochlorothiazide   12.5 mg Oral Daily   ipratropium-albuterol   3 mL Nebulization QID   methylPREDNISolone  (SOLU-MEDROL ) injection  40 mg Intravenous Q12H   metoprolol  tartrate  50 mg Oral BID   nicotine   21 mg Transdermal Daily   Continuous Infusions:  azithromycin  Stopped (04/20/24 0142)     LOS: 1 day    Time spent: 45 minutes spent on chart review, discussion with nursing staff, consultants, updating family and interview/physical exam; more than 50% of that time was spent in counseling and/or coordination of care.    Christie Harvard, DO [facility] Available via Epic secure chat 7am-7pm After these hours, please refer to coverage provider listed on [email or web address] 04/20/2024, 10:45 AM

## 2024-04-20 NOTE — Progress Notes (Signed)
 Mobility Specialist - Progress Note  (RA) Pre-mobility: 88 bpm HR, 99% SpO2 During mobility: 105 bpm HR, 97-99% SpO2 Post-mobility: 103 bpm HR, 97% SPO2   04/20/24 1124  Mobility  Activity Ambulated independently in hallway  Level of Assistance Independent  Assistive Device None  Distance Ambulated (ft) 500 ft  Range of Motion/Exercises Active  Activity Response Tolerated well  Mobility Referral Yes  Mobility visit 1 Mobility  Mobility Specialist Start Time (ACUTE ONLY) 1109  Mobility Specialist Stop Time (ACUTE ONLY) 1124  Mobility Specialist Time Calculation (min) (ACUTE ONLY) 15 min   Pt was found in bed and agreeable to ambulate. Non-productive coughing. At EOS returned to bed with all needs met. Call bell in reach. RN notified.  Lorna Rose,  Mobility Specialist Can be reached via Secure Chat

## 2024-04-21 DIAGNOSIS — J441 Chronic obstructive pulmonary disease with (acute) exacerbation: Secondary | ICD-10-CM | POA: Diagnosis not present

## 2024-04-21 MED ORDER — ALBUTEROL SULFATE (2.5 MG/3ML) 0.083% IN NEBU
2.5000 mg | INHALATION_SOLUTION | RESPIRATORY_TRACT | Status: DC | PRN
  Administered 2024-04-22: 2.5 mg via RESPIRATORY_TRACT
  Filled 2024-04-21 (×2): qty 3

## 2024-04-21 MED ORDER — IPRATROPIUM-ALBUTEROL 0.5-2.5 (3) MG/3ML IN SOLN
3.0000 mL | Freq: Three times a day (TID) | RESPIRATORY_TRACT | Status: DC
  Administered 2024-04-22: 3 mL via RESPIRATORY_TRACT
  Filled 2024-04-21: qty 3

## 2024-04-21 MED ORDER — ALBUTEROL SULFATE (2.5 MG/3ML) 0.083% IN NEBU: 2.5000 mg | INHALATION_SOLUTION | RESPIRATORY_TRACT | 2 refills | Status: AC | PRN

## 2024-04-21 NOTE — Progress Notes (Signed)
 PROGRESS NOTE    [name]  XBM:[id number] DOB: 1951/03/16 DOA: 04/18/2024 PCP: Christie Clunes, MD    Brief Narrative:  [name] is a 73 y.o. female with medical history significant for COPD, HTN, HLD and depression who presents to the ED for evaluation of shortness of breath and wheezing and admitted for COPD exacerbation.  Continues to smoke.  Slowly improving so plan is for discharge in the a.m. if able to be weaned off O2  Assessment and Plan: COPD with acute exacerbation with Acute hypoxic respiratory failure - Presented with 2 days of shortness of breath, wheezing and cough - NP swab negative - New O2 requirements in the setting of COPD exacerbation-wean as able to room air - Will use IV steroids-wean to p.o. steroids - Start azithromycin  500 mg daily x 3 - Scheduled DuoNebs - Continue home bronchodilator - Incentive spirometer, flutter valve -Out of bed and ambulating - echo-EF preserved   Hypertensive urgency - Found to have significantly elevated BP with SBP in the 170s to 200s - This is in the setting of patient not taking her home BP meds - Resume home amlodipine , HCTZ, losartan  and Lopressor --adjust for better control   Tobacco use disorder - Reports smoking 1 pack/day of cigarettes    DVT prophylaxis: enoxaparin  (LOVENOX ) injection 40 mg Start: 04/19/24 1000    Code Status: Full Code   Disposition Plan:  Level of care: Telemetry      Consultants:  None   Subjective: Overall breathing improved but dyspneic after walking  Objective: Vitals:   04/20/24 2237 04/21/24 0444 04/21/24 0901 04/21/24 0956  BP:  (!) 144/66    Pulse:  65    Resp:  15    Temp:  98 F (36.7 C)    TempSrc:  Oral    SpO2: 98% 100% 99% 100%  Weight:      Height:        Intake/Output Summary (Last 24 hours) at 04/21/2024 1127 Last data filed at 04/21/2024 0647 Gross per 24 hour  Intake 720 ml  Output --  Net 720 ml   Filed Weights   04/18/24  1744  Weight: 71.5 kg    Examination:   General: Appearance:     Overweight female who appears comfortable     Lungs:   Moving more air  Heart:    Normal heart rate.     MS:   All extremities are intact.    Neurologic:   Awake, alert       Data Reviewed: I have personally reviewed following labs and imaging studies  CBC: Recent Labs  Lab 04/18/24 1809 04/19/24 0544 04/20/24 0506  WBC 7.8 3.3* 5.9  NEUTROABS 5.2  --   --   HGB 12.6 12.9 13.8  HCT 38.6 40.5 42.5  MCV 91.5 92.9 92.2  PLT 283 303 286   Basic Metabolic Panel: Recent Labs  Lab 04/18/24 1809 04/19/24 0544 04/19/24 0554 04/20/24 0506  NA 139 136  --  139  K 3.3* 4.0  --  4.4  CL 106 103  --  104  CO2 23 21*  --  21*  GLUCOSE 126* 152*  --  153*  BUN 7* 10  --  13  CREATININE 0.74 0.62  --  0.73  CALCIUM  8.6* 9.2  --  9.5  MG  --   --  2.0  --    GFR: Estimated Creatinine Clearance: 63.5 mL/min (by C-G formula based on SCr of 0.73  mg/dL). Liver Function Tests: No results for input(s): AST, ALT, ALKPHOS, BILITOT, PROT, ALBUMIN in the last 168 hours. No results for input(s): LIPASE, AMYLASE in the last 168 hours. No results for input(s): AMMONIA in the last 168 hours. Coagulation Profile: No results for input(s): INR, PROTIME in the last 168 hours. Cardiac Enzymes: No results for input(s): CKTOTAL, CKMB, CKMBINDEX, TROPONINI in the last 168 hours. BNP (last 3 results) No results for input(s): PROBNP in the last 8760 hours. HbA1C: No results for input(s): HGBA1C in the last 72 hours. CBG: No results for input(s): GLUCAP in the last 168 hours. Lipid Profile: No results for input(s): CHOL, HDL, LDLCALC, TRIG, CHOLHDL, LDLDIRECT in the last 72 hours. Thyroid  Function Tests: No results for input(s): TSH, T4TOTAL, FREET4, T3FREE, THYROIDAB in the last 72 hours. Anemia Panel: No results for input(s): VITAMINB12, FOLATE, FERRITIN,  TIBC, IRON, RETICCTPCT in the last 72 hours. Sepsis Labs: No results for input(s): PROCALCITON, LATICACIDVEN in the last 168 hours.  Recent Results (from the past 240 hours)  Respiratory (~20 pathogens) panel by PCR     Status: None   Collection Time: 04/19/24  8:16 AM   Specimen: Nasopharyngeal Swab; Respiratory  Result Value Ref Range Status   Adenovirus NOT DETECTED NOT DETECTED Final   Coronavirus 229E NOT DETECTED NOT DETECTED Final    Comment: (NOTE) The Coronavirus on the Respiratory Panel, DOES NOT test for the novel  Coronavirus (2019 nCoV)    Coronavirus HKU1 NOT DETECTED NOT DETECTED Final   Coronavirus NL63 NOT DETECTED NOT DETECTED Final   Coronavirus OC43 NOT DETECTED NOT DETECTED Final   Metapneumovirus NOT DETECTED NOT DETECTED Final   Rhinovirus / Enterovirus NOT DETECTED NOT DETECTED Final   Influenza A NOT DETECTED NOT DETECTED Final   Influenza B NOT DETECTED NOT DETECTED Final   Parainfluenza Virus 1 NOT DETECTED NOT DETECTED Final   Parainfluenza Virus 2 NOT DETECTED NOT DETECTED Final   Parainfluenza Virus 3 NOT DETECTED NOT DETECTED Final   Parainfluenza Virus 4 NOT DETECTED NOT DETECTED Final   Respiratory Syncytial Virus NOT DETECTED NOT DETECTED Final   Bordetella pertussis NOT DETECTED NOT DETECTED Final   Bordetella Parapertussis NOT DETECTED NOT DETECTED Final   Chlamydophila pneumoniae NOT DETECTED NOT DETECTED Final   Mycoplasma pneumoniae NOT DETECTED NOT DETECTED Final    Comment: Performed at [name], [address], [place], [place] [address]         Radiology Studies: ECHOCARDIOGRAM COMPLETE Result Date: 04/19/2024    ECHOCARDIOGRAM REPORT   Patient Name:   [name] Date of Exam: 04/19/2024 Medical Rec #:  [id number]          Height:       66.0 in Accession #:    [id number]         Weight:       157.6 lb Date of Birth:  26-Sep-1951          BSA:          1.807 m Patient Age:    73 years           BP:            194/74 mmHg Patient Gender: F                  HR:           87 bpm. Exam Location:  Inpatient Procedure: 2D Echo, Color Doppler and Cardiac Doppler (Both Spectral and Color  Flow Doppler were utilized during procedure). Indications:    Abnormal ECG  History:        Patient has no prior history of Echocardiogram examinations.                 COPD; Risk Factors:Dyslipidemia and Hypertension.  Sonographer:    Janette Medley Referring Phys: [id number] Terrye Dombrosky U Mychal Durio IMPRESSIONS  1. Left ventricular ejection fraction, by estimation, is >75%. The left ventricle has hyperdynamic function. The left ventricle has no regional wall motion abnormalities. There is severe asymmetric left ventricular hypertrophy of the basal-septal segment. Left ventricular diastolic parameters are indeterminate.  2. Right ventricular systolic function is normal. The right ventricular size is normal. There is normal pulmonary artery systolic pressure.  3. The mitral valve is normal in structure. Trivial mitral valve regurgitation. No evidence of mitral stenosis.  4. The aortic valve is tricuspid. Aortic valve regurgitation is not visualized. No aortic stenosis is present.  5. The inferior vena cava is normal in size with greater than 50% respiratory variability, suggesting right atrial pressure of 3 mmHg. Comparison(s): Prior images unable to be directly viewed, comparison made by report only. FINDINGS  Left Ventricle: Left ventricular ejection fraction, by estimation, is >75%. The left ventricle has hyperdynamic function. The left ventricle has no regional wall motion abnormalities. The left ventricular internal cavity size was normal in size. There is severe asymmetric left ventricular hypertrophy of the basal-septal segment. Left ventricular diastolic parameters are indeterminate. Right Ventricle: The right ventricular size is normal. Right vetricular wall thickness was not well visualized. Right ventricular systolic function is normal.  There is normal pulmonary artery systolic pressure. The tricuspid regurgitant velocity is 2.48 m/s, and with an assumed right atrial pressure of 3 mmHg, the estimated right ventricular systolic pressure is 27.6 mmHg. Left Atrium: Left atrial size was normal in size. Right Atrium: Right atrial size was normal in size. Pericardium: There is no evidence of pericardial effusion. Mitral Valve: The mitral valve is normal in structure. Trivial mitral valve regurgitation. No evidence of mitral valve stenosis. Tricuspid Valve: The tricuspid valve is normal in structure. Tricuspid valve regurgitation is mild . No evidence of tricuspid stenosis. Aortic Valve: The aortic valve is tricuspid. Aortic valve regurgitation is not visualized. No aortic stenosis is present. Pulmonic Valve: The pulmonic valve was not well visualized. Pulmonic valve regurgitation is not visualized. No evidence of pulmonic stenosis. Aorta: The aortic root, ascending aorta, aortic arch and descending aorta are all structurally normal, with no evidence of dilitation or obstruction. Venous: The inferior vena cava is normal in size with greater than 50% respiratory variability, suggesting right atrial pressure of 3 mmHg. IAS/Shunts: The atrial septum is grossly normal.  LEFT VENTRICLE PLAX 2D LVIDd:         4.10 cm   Diastology LVIDs:         2.00 cm   LV e' medial:    5.55 cm/s LV PW:         1.10 cm   LV E/e' medial:  14.7 LV IVS:        2.10 cm   LV e' lateral:   6.53 cm/s LVOT diam:     2.00 cm   LV E/e' lateral: 12.5 LV SV:         111 LV SV Index:   62 LVOT Area:     3.14 cm  RIGHT VENTRICLE             IVC RV S prime:  10.60 cm/s  IVC diam: 1.60 cm TAPSE (M-mode): 1.8 cm LEFT ATRIUM             Index        RIGHT ATRIUM           Index LA diam:        3.10 cm 1.72 cm/m   RA Area:     11.90 cm LA Vol (A2C):   22.2 ml 12.28 ml/m  RA Volume:   25.70 ml  14.22 ml/m LA Vol (A4C):   40.9 ml 22.63 ml/m LA Biplane Vol: 32.6 ml 18.04 ml/m  AORTIC  VALVE LVOT Vmax:   192.00 cm/s LVOT Vmean:  129.000 cm/s LVOT VTI:    0.354 m  AORTA Ao Root diam: 2.60 cm Ao Asc diam:  2.90 cm MITRAL VALVE                TRICUSPID VALVE MV Area (PHT): 4.21 cm     TR Peak grad:   24.6 mmHg MV Decel Time: 180 msec     TR Vmax:        248.00 cm/s MV E velocity: 81.50 cm/s MV A velocity: 135.00 cm/s  SHUNTS MV E/A ratio:  0.60         Systemic VTI:  0.35 m                             Systemic Diam: 2.00 cm Sheryle Donning MD Electronically signed by Sheryle Donning MD Signature Date/Time: 04/19/2024/7:58:53 PM    Final         Scheduled Meds:  amLODipine   10 mg Oral Daily   budesonide -glycopyrrolate -formoterol   2 puff Inhalation BID   enoxaparin  (LOVENOX ) injection  40 mg Subcutaneous Q24H   guaiFENesin   600 mg Oral BID   losartan   100 mg Oral Daily   And   hydrochlorothiazide   12.5 mg Oral Daily   ipratropium-albuterol   3 mL Nebulization QID   methylPREDNISolone  (SOLU-MEDROL ) injection  40 mg Intravenous Q12H   metoprolol  tartrate  50 mg Oral BID   nicotine   21 mg Transdermal Daily   Continuous Infusions:     LOS: 2 days    Time spent: 45 minutes spent on chart review, discussion with nursing staff, consultants, updating family and interview/physical exam; more than 50% of that time was spent in counseling and/or coordination of care.    Enrigue Harvard, DO [facility] Available via Epic secure chat 7am-7pm After these hours, please refer to coverage provider listed on [email or web address] 04/21/2024, 11:27 AM

## 2024-04-21 NOTE — Plan of Care (Signed)

## 2024-04-21 NOTE — Plan of Care (Signed)
  Problem: Safety: Goal: Ability to remain free from injury will improve Outcome: Progressing   Problem: Nutrition: Goal: Adequate nutrition will be maintained Outcome: Progressing   

## 2024-04-21 NOTE — Progress Notes (Signed)
 Mobility Specialist - Progress Note   04/21/24 0956  Oxygen  Therapy  SpO2 100 %  O2 Device Nasal Cannula  O2 Flow Rate (L/min) 1 L/min  Patient Activity (if Appropriate) Ambulating  Mobility  Activity Ambulated independently in hallway  Level of Assistance Independent  Assistive Device None  Distance Ambulated (ft) 275 ft  Activity Response Tolerated well  Mobility Referral Yes  Mobility visit 1 Mobility  Mobility Specialist Start Time (ACUTE ONLY) [id number]  Mobility Specialist Stop Time (ACUTE ONLY) 0955  Mobility Specialist Time Calculation (min) (ACUTE ONLY) 18 min   Pt received in bed and agreeable to mobility. No complaints during session. Pt to bed after session with all needs met.    Pre-mobility: 103 HR, 100% SpO2 (1L [place]) During mobility: 107 HR, 100% SpO2 (1L [place]) Post-mobility: 102 HR, 100% SPO2 (1L [place])  Chief Technology Officer

## 2024-04-21 NOTE — Plan of Care (Signed)
  Problem: Clinical Measurements: Goal: Respiratory complications will improve 04/21/2024 2213 by Marino Sias, RN Outcome: Progressing 04/21/2024 1959 by Marino Sias, RN Outcome: Progressing   Problem: Nutrition: Goal: Adequate nutrition will be maintained 04/21/2024 2213 by Marino Sias, RN Outcome: Progressing 04/21/2024 1959 by Marino Sias, RN Outcome: Progressing   Problem: Safety: Goal: Ability to remain free from injury will improve 04/21/2024 2213 by Marino Sias, RN Outcome: Progressing 04/21/2024 1959 by Marino Sias, RN Outcome: Progressing

## 2024-04-22 DIAGNOSIS — J441 Chronic obstructive pulmonary disease with (acute) exacerbation: Secondary | ICD-10-CM | POA: Diagnosis not present

## 2024-04-22 MED ORDER — PREDNISONE 20 MG PO TABS
40.0000 mg | ORAL_TABLET | Freq: Every day | ORAL | Status: DC
  Administered 2024-04-22: 40 mg via ORAL
  Filled 2024-04-22: qty 2

## 2024-04-22 MED ORDER — PREDNISONE 20 MG PO TABS: 40.0000 mg | ORAL_TABLET | Freq: Every day | ORAL | 0 refills | Status: DC

## 2024-04-22 MED ORDER — GUAIFENESIN ER 600 MG PO TB12: 600.0000 mg | ORAL_TABLET | Freq: Two times a day (BID) | ORAL | Status: DC

## 2024-04-22 MED ORDER — METOPROLOL TARTRATE 50 MG PO TABS: 50.0000 mg | ORAL_TABLET | Freq: Two times a day (BID) | ORAL | 0 refills | Status: AC

## 2024-04-22 MED ORDER — GUAIFENESIN-DM 100-10 MG/5ML PO SYRP: 10.0000 mL | ORAL_SOLUTION | ORAL | 0 refills | Status: DC | PRN

## 2024-04-22 NOTE — Discharge Summary (Signed)
 Physician Discharge Summary  [name] AVW:[id number] DOB: [birth year] DOA: 04/18/2024  PCP: Christie Clunes, MD  Admit date: 04/18/2024 Discharge date: 04/22/2024  Admitted From: Home Discharge disposition: Home   Recommendations for Outpatient Follow-Up:   Smoking cessation Needs better blood pressure control also medications adjusted   Discharge Diagnosis:   Principal Problem:   COPD exacerbation (HCC) Active Problems:   Acute hypoxic respiratory failure (HCC)   Hypertensive urgency    Discharge Condition: Improved.  Diet recommendation: Low sodium, heart healthy  Wound care: None.  Code status: Full.   History of Present Illness:   [name] is a 73 y.o. female with medical history significant for COPD, HTN, HLD and depression who presents to the ED for evaluation of shortness of breath and wheezing.  Patient reports that yesterday evening she started having worsening shortness of breath and wheezing.  She gave herself 1 breathing treatment however she woke up this morning with significant shortness of breath so she called 911.  She endorsed mild cough but denies any fevers, chills, chest pain, dizziness, headache, nausea, vomiting or abdominal pain.  She reports using her inhalers daily but continues to smoke 1 pack/day.  She is interested in quitting.   On EMS arrival, patient was found with significant wheezing and O2 sats in the low 90s.  She was given albuterol  50 mg x 3 doses, Atrovent  1 mg x 2 doses, IV Solu-Medrol  125 mg x 1 and IV mag 2 g x 1. Initial vitals were BP 190/99, HR 120, RR 30s   ED Course: Initial vitals show temp 98.7, RR 20, HR 112, BP 174/117, SpO2 100% on 2 L. Initial labs significant for K+ 3.3, BNP 108, WBC 7.8, Hgb 12.6, normal kidney function and normal D-dimer.  EKG shows sinus tach with PVCs.  Chest x-ray with no acute abnormalities.  Pt received multiple DuoNebs, IV hydralazine  10 mg and home BP meds for  severely elevated BP with SBP in the 200s. TRH was consulted for admission.      Hospital Course by Problem:   COPD with acute exacerbation with Acute hypoxic respiratory failure - Presented with 2 days of shortness of breath, wheezing and cough - NP swab negative - New O2 requirements in the setting of COPD exacerbation-weaned to room air - Will use IV steroids-wean to p.o. steroids for discharge - azithromycin  500 mg daily x 3 - Continue home bronchodilator - Incentive spirometer, flutter valve - echo-EF preserved   Hypertensive urgency - Found to have significantly elevated BP with SBP in the 170s to 200s - This is in the setting of patient not taking her home BP meds - Resume home amlodipine , HCTZ, losartan  and Lopressor --adjust for better control   Tobacco use disorder - Reports smoking 1 pack/day of cigarettes      Medical Consultants:      Discharge Exam:   Vitals:   04/22/24 0730 04/22/24 0735  BP:    Pulse:    Resp:    Temp:    SpO2: 99% 99%   Vitals:   04/21/24 2127 04/22/24 0535 04/22/24 0730 04/22/24 0735  BP: (!) 158/66 137/72    Pulse: 84 (!) 56    Resp:      Temp:  98.4 F (36.9 C)    TempSrc:  Oral    SpO2:  100% 99% 99%  Weight:      Height:        General exam: Appears calm and  comfortable, not on O2, comfortable appearing with no wheezing   The results of significant diagnostics from this hospitalization (including imaging, microbiology, ancillary and laboratory) are listed below for reference.     Procedures and Diagnostic Studies:   ECHOCARDIOGRAM COMPLETE Result Date: 04/19/2024    ECHOCARDIOGRAM REPORT   Patient Name:   [name] [facility] Date of Exam: 04/19/2024 Medical Rec #:  [id number]          Height:       66.0 in Accession #:    [id number]         Weight:       157.6 lb Date of Birth:  Nov 29, 1950          BSA:          1.807 m Patient Age:    73 years           BP:           194/74 mmHg Patient Gender: F                  HR:            87 bpm. Exam Location:  Inpatient Procedure: 2D Echo, Color Doppler and Cardiac Doppler (Both Spectral and Color            Flow Doppler were utilized during procedure). Indications:    Abnormal ECG  History:        Patient has no prior history of Echocardiogram examinations.                 COPD; Risk Factors:Dyslipidemia and Hypertension.  Sonographer:    [name] Referring Phys: [id number] [name] IMPRESSIONS  1. Left ventricular ejection fraction, by estimation, is >75%. The left ventricle has hyperdynamic function. The left ventricle has no regional wall motion abnormalities. There is severe asymmetric left ventricular hypertrophy of the basal-septal segment. Left ventricular diastolic parameters are indeterminate.  2. Right ventricular systolic function is normal. The right ventricular size is normal. There is normal pulmonary artery systolic pressure.  3. The mitral valve is normal in structure. Trivial mitral valve regurgitation. No evidence of mitral stenosis.  4. The aortic valve is tricuspid. Aortic valve regurgitation is not visualized. No aortic stenosis is present.  5. The inferior vena cava is normal in size with greater than 50% respiratory variability, suggesting right atrial pressure of 3 mmHg. Comparison(s): Prior images unable to be directly viewed, comparison made by report only. FINDINGS  Left Ventricle: Left ventricular ejection fraction, by estimation, is >75%. The left ventricle has hyperdynamic function. The left ventricle has no regional wall motion abnormalities. The left ventricular internal cavity size was normal in size. There is severe asymmetric left ventricular hypertrophy of the basal-septal segment. Left ventricular diastolic parameters are indeterminate. Right Ventricle: The right ventricular size is normal. Right vetricular wall thickness was not well visualized. Right ventricular systolic function is normal. There is normal pulmonary artery systolic pressure. The  tricuspid regurgitant velocity is 2.48 m/s, and with an assumed right atrial pressure of 3 mmHg, the estimated right ventricular systolic pressure is 27.6 mmHg. Left Atrium: Left atrial size was normal in size. Right Atrium: Right atrial size was normal in size. Pericardium: There is no evidence of pericardial effusion. Mitral Valve: The mitral valve is normal in structure. Trivial mitral valve regurgitation. No evidence of mitral valve stenosis. Tricuspid Valve: The tricuspid valve is normal in structure. Tricuspid valve regurgitation is mild . No evidence of tricuspid stenosis.  Aortic Valve: The aortic valve is tricuspid. Aortic valve regurgitation is not visualized. No aortic stenosis is present. Pulmonic Valve: The pulmonic valve was not well visualized. Pulmonic valve regurgitation is not visualized. No evidence of pulmonic stenosis. Aorta: The aortic root, ascending aorta, aortic arch and descending aorta are all structurally normal, with no evidence of dilitation or obstruction. Venous: The inferior vena cava is normal in size with greater than 50% respiratory variability, suggesting right atrial pressure of 3 mmHg. IAS/Shunts: The atrial septum is grossly normal.  LEFT VENTRICLE PLAX 2D LVIDd:         4.10 cm   Diastology LVIDs:         2.00 cm   LV e' medial:    5.55 cm/s LV PW:         1.10 cm   LV E/e' medial:  14.7 LV IVS:        2.10 cm   LV e' lateral:   6.53 cm/s LVOT diam:     2.00 cm   LV E/e' lateral: 12.5 LV SV:         111 LV SV Index:   62 LVOT Area:     3.14 cm  RIGHT VENTRICLE             IVC RV S prime:     10.60 cm/s  IVC diam: 1.60 cm TAPSE (M-mode): 1.8 cm LEFT ATRIUM             Index        RIGHT ATRIUM           Index LA diam:        3.10 cm 1.72 cm/m   RA Area:     11.90 cm LA Vol (A2C):   22.2 ml 12.28 ml/m  RA Volume:   25.70 ml  14.22 ml/m LA Vol (A4C):   40.9 ml 22.63 ml/m LA Biplane Vol: 32.6 ml 18.04 ml/m  AORTIC VALVE LVOT Vmax:   192.00 cm/s LVOT Vmean:  129.000 cm/s  LVOT VTI:    0.354 m  AORTA Ao Root diam: 2.60 cm Ao Asc diam:  2.90 cm MITRAL VALVE                TRICUSPID VALVE MV Area (PHT): 4.21 cm     TR Peak grad:   24.6 mmHg MV Decel Time: 180 msec     TR Vmax:        248.00 cm/s MV E velocity: 81.50 cm/s MV A velocity: 135.00 cm/s  SHUNTS MV E/A ratio:  0.60         Systemic VTI:  0.35 m                             Systemic Diam: 2.00 cm Sheryle Donning MD Electronically signed by Sheryle Donning MD Signature Date/Time: 04/19/2024/7:58:53 PM    Final    DG Chest Portable 1 View Result Date: 04/18/2024 CLINICAL DATA:  Dyspnea. EXAM: PORTABLE CHEST 1 VIEW COMPARISON:  February 17, 2024. FINDINGS: The heart size and mediastinal contours are within normal limits. Both lungs are clear. Old right rib fractures are noted. IMPRESSION: No active disease. Electronically Signed   By: Rosalene Colon M.D.   On: 04/18/2024 18:04     Labs:   Basic Metabolic Panel: Recent Labs  Lab 04/18/24 1809 04/19/24 0544 04/19/24 0554 04/20/24 0506  NA 139 136  --  139  K  3.3* 4.0  --  4.4  CL 106 103  --  104  CO2 23 21*  --  21*  GLUCOSE 126* 152*  --  153*  BUN 7* 10  --  13  CREATININE 0.74 0.62  --  0.73  CALCIUM  8.6* 9.2  --  9.5  MG  --   --  2.0  --    GFR Estimated Creatinine Clearance: 63.5 mL/min (by C-G formula based on SCr of 0.73 mg/dL). Liver Function Tests: No results for input(s): AST, ALT, ALKPHOS, BILITOT, PROT, ALBUMIN in the last 168 hours. No results for input(s): LIPASE, AMYLASE in the last 168 hours. No results for input(s): AMMONIA in the last 168 hours. Coagulation profile No results for input(s): INR, PROTIME in the last 168 hours.  CBC: Recent Labs  Lab 04/18/24 1809 04/19/24 0544 04/20/24 0506  WBC 7.8 3.3* 5.9  NEUTROABS 5.2  --   --   HGB 12.6 12.9 13.8  HCT 38.6 40.5 42.5  MCV 91.5 92.9 92.2  PLT 283 303 286   Cardiac Enzymes: No results for input(s): CKTOTAL, CKMB, CKMBINDEX,  TROPONINI in the last 168 hours. BNP: Invalid input(s): POCBNP CBG: No results for input(s): GLUCAP in the last 168 hours. D-Dimer No results for input(s): DDIMER in the last 72 hours. Hgb A1c No results for input(s): HGBA1C in the last 72 hours. Lipid Profile No results for input(s): CHOL, HDL, LDLCALC, TRIG, CHOLHDL, LDLDIRECT in the last 72 hours. Thyroid  function studies No results for input(s): TSH, T4TOTAL, T3FREE, THYROIDAB in the last 72 hours.  Invalid input(s): FREET3 Anemia work up No results for input(s): VITAMINB12, FOLATE, FERRITIN, TIBC, IRON, RETICCTPCT in the last 72 hours. Microbiology Recent Results (from the past 240 hours)  Respiratory (~20 pathogens) panel by PCR     Status: None   Collection Time: 04/19/24  8:16 AM   Specimen: Nasopharyngeal Swab; Respiratory  Result Value Ref Range Status   Adenovirus NOT DETECTED NOT DETECTED Final   Coronavirus 229E NOT DETECTED NOT DETECTED Final    Comment: (NOTE) The Coronavirus on the Respiratory Panel, DOES NOT test for the novel  Coronavirus (2019 nCoV)    Coronavirus HKU1 NOT DETECTED NOT DETECTED Final   Coronavirus NL63 NOT DETECTED NOT DETECTED Final   Coronavirus OC43 NOT DETECTED NOT DETECTED Final   Metapneumovirus NOT DETECTED NOT DETECTED Final   Rhinovirus / Enterovirus NOT DETECTED NOT DETECTED Final   Influenza A NOT DETECTED NOT DETECTED Final   Influenza B NOT DETECTED NOT DETECTED Final   Parainfluenza Virus 1 NOT DETECTED NOT DETECTED Final   Parainfluenza Virus 2 NOT DETECTED NOT DETECTED Final   Parainfluenza Virus 3 NOT DETECTED NOT DETECTED Final   Parainfluenza Virus 4 NOT DETECTED NOT DETECTED Final   Respiratory Syncytial Virus NOT DETECTED NOT DETECTED Final   Bordetella pertussis NOT DETECTED NOT DETECTED Final   Bordetella Parapertussis NOT DETECTED NOT DETECTED Final   Chlamydophila pneumoniae NOT DETECTED NOT DETECTED Final   Mycoplasma  pneumoniae NOT DETECTED NOT DETECTED Final    Comment: Performed at [facility], [address] [place], [place] [address]     Discharge Instructions:   Discharge Instructions     Diet - low sodium heart healthy   Complete by: As directed    Discharge instructions   Complete by: As directed    Stop smoking   Increase activity slowly   Complete by: As directed       Allergies as  of 04/22/2024       Reactions   Ace Inhibitors Anaphylaxis, Swelling, Other (See Comments)   Throat swelling  (Re: Lisinopril -hydrochlorothiazide ) Pt questioned if she is allergic in 2025.        Medication List     TAKE these medications    acetaminophen  325 MG tablet Commonly known as: TYLENOL  Take 2 tablets (650 mg total) by mouth every 6 (six) hours as needed for mild pain (or Fever >/= 101).   albuterol  108 (90 Base) MCG/ACT inhaler Commonly known as: VENTOLIN  HFA Inhale 2 puffs into the lungs every 6 (six) hours as needed for wheezing or shortness of breath. What changed: Another medication with the same name was changed. Make sure you understand how and when to take each.   albuterol  (2.5 MG/3ML) 0.083% nebulizer solution Commonly known as: PROVENTIL  Take 3 mLs (2.5 mg total) by nebulization every 4 (four) hours as needed for wheezing or shortness of breath. What changed: See the new instructions.   amLODipine  10 MG tablet Commonly known as: NORVASC  Take 1 tablet (10 mg total) by mouth daily.   guaiFENesin  600 MG 12 hr tablet Commonly known as: MUCINEX  Take 1 tablet (600 mg total) by mouth 2 (two) times daily.   guaiFENesin -dextromethorphan  100-10 MG/5ML syrup Commonly known as: ROBITUSSIN DM Take 10 mLs by mouth every 4 (four) hours as needed for cough.   losartan -hydrochlorothiazide  50-12.5 MG tablet Commonly known as: HYZAAR Take 1 tablet by mouth 2 (two) times daily.   metoprolol  tartrate 50 MG tablet Commonly known as: LOPRESSOR  Take 1 tablet (50 mg total) by  mouth 2 (two) times daily. What changed:  medication strength how much to take   predniSONE  20 MG tablet Commonly known as: DELTASONE  Take 2 tablets (40 mg total) by mouth daily with breakfast. Start taking on: April 23, 2024   Trelegy Ellipta  100-62.5-25 MCG/ACT Aepb Generic drug: Fluticasone -Umeclidin-Vilant Inhale 1 puff into the lungs daily. What changed:  when to take this reasons to take this        Follow-up Information     Christie Clunes, MD Follow up in 1 week(s).   Specialty: Internal Medicine Contact information: [address] [place] [address] [phone]                  Time coordinating discharge: 45 minutes  Signed:  Enrigue Harvard DO  [facility] 04/22/2024, 10:08 AM

## 2024-04-22 NOTE — Plan of Care (Signed)

## 2024-04-23 ENCOUNTER — Telehealth: Payer: Self-pay

## 2024-04-23 DIAGNOSIS — J441 Chronic obstructive pulmonary disease with (acute) exacerbation: Secondary | ICD-10-CM

## 2024-04-23 DIAGNOSIS — I10 Essential (primary) hypertension: Secondary | ICD-10-CM

## 2024-04-23 NOTE — Patient Instructions (Signed)
 Visit Information  Thank you for taking time to visit with me today. Please don't hesitate to contact me if I can be of assistance to you before our next scheduled telephone appointment.  Our next appointment is by telephone on Thursday June 19th at 2:30pm  Following is a copy of your care plan:   Goals Addressed             This Visit's Progress    VBCI Transitions of Care (TOC) Care Plan       Problems:  Recent Hospitalization for treatment of COPD Medication access barrier The patient is on disability and states she cannot always afford her medication, Medication management barrier : The patient has a history of non-compliance, and No Hospital Follow Up Provider appointment CM to send a request for the provider to call the patient  Goal:  Over the next 30 days, the patient will not experience hospital readmission  Interventions:   COPD Interventions: Advised patient to track and manage COPD triggers Assessed social determinant of health barriers Discussed the importance of adequate rest and management of fatigue with COPD Provided instruction about proper use of medications used for management of COPD including inhalers Referral made to community resources care guide team for assistance with Pharmacy referral  Screening for signs and symptoms of depression related to chronic disease state   Patient Self Care Activities:  Attend all scheduled provider appointments Call pharmacy for medication refills 3-7 days in advance of running out of medications Call provider office for new concerns or questions  Notify RN Care Manager of [facility] call rescheduling needs Participate in Transition of Care Program/Attend [facility] scheduled calls Perform all self care activities independently  Take medications as prescribed    Plan:  Telephone follow up appointment with care management team member scheduled for:  Thursday June 19th at 2:30pm        Patient verbalizes understanding of  instructions and care plan provided today and agrees to view in MyChart. Active MyChart status and patient understanding of how to access instructions and care plan via MyChart confirmed with patient.     The patient has been provided with contact information for the care management team and has been advised to call with any health related questions or concerns.   Please call the care guide team at [phone] if you need to cancel or reschedule your appointment.   Please call the Suicide and Crisis Lifeline: 988 call the [place]  National Suicide Prevention Lifeline: 6518881300 or TTY: 509-402-1632 TTY 902 673 4395) to talk to a trained counselor if you are experiencing a Mental Health or Behavioral Health Crisis or need someone to talk to.  Gareld June, BSN, RN [place]  VBCI - [employer] [facility] RN Care Manager [phone]

## 2024-04-23 NOTE — Transitions of Care (Post Inpatient/ED Visit) (Signed)
 04/23/2024  Name: [name] MRN: [id number] DOB: 1950/11/26  Today's TOC FU Call Status: Today's TOC FU Call Status:: Successful TOC FU Call Completed TOC FU Call Complete Date: 04/23/24 Patient's Name and Date of Birth confirmed.  Transition Care Management Follow-up Telephone Call Date of Discharge: 04/22/24 Discharge Facility: [facility]) Type of Discharge: Inpatient Admission Primary Inpatient Discharge Diagnosis:: COPD exacerbation How have you been since you were released from the hospital?: Better Any questions or concerns?: Yes Patient Questions/Concerns:: Has problems affording medication Patient Questions/Concerns Addressed: Other: (Referral to pharmacy)  Items Reviewed: Did you receive and understand the discharge instructions provided?: Yes Medications obtained,verified, and reconciled?: Yes (Medications Reviewed) (The patient has not picked up some of her meds due to finances) Any new allergies since your discharge?: No Dietary orders reviewed?: Yes Type of Diet Ordered:: Low sodium heart healthy Do you have support at home?: Yes People in Home [RPT]: spouse Name of Support/Comfort Primary Source: Shara Das  Medications Reviewed Today: Medications Reviewed Today     Reviewed by Claudene Crystal, RN (Case Manager) on 04/23/24 at 1345  Med List Status: <None>   Medication Order Taking? Sig Documenting Provider Last Dose Status Informant  acetaminophen  (TYLENOL ) 325 MG tablet [id number]  Take 2 tablets (650 mg total) by mouth every 6 (six) hours as needed for mild pain (or Fever >/= 101). Joette Mustard, MD  Active Self, Pharmacy Records, Multiple Informants  albuterol  (PROVENTIL ) (2.5 MG/3ML) 0.083% nebulizer solution [id number]  Take 3 mLs (2.5 mg total) by nebulization every 4 (four) hours as needed for wheezing or shortness of breath.  Patient not taking: Reported on 04/23/2024   Gomez-Caraballo, Maria, MD  Active   albuterol  (VENTOLIN  HFA)  [phone] Base) MCG/ACT inhaler [id number]  Inhale 2 puffs into the lungs every 6 (six) hours as needed for wheezing or shortness of breath. Cleven Dallas, DO  Active Self, Pharmacy Records, Multiple Informants  amLODipine  (NORVASC ) 10 MG tablet [id number]  Take 1 tablet (10 mg total) by mouth daily.  Patient not taking: Reported on 04/19/2024   Jackolyn Masker, MD  Active Self, Pharmacy Records, Multiple Informants  Fluticasone -Umeclidin-Vilant (TRELEGY ELLIPTA ) 100-62.5-25 MCG/ACT AEPB [id number]  Inhale 1 puff into the lungs daily.  Patient taking differently: Inhale 1 puff into the lungs daily as needed (wheezing/SOB).   Jackolyn Masker, MD  Active Self, Pharmacy Records, Multiple Informants           Med Note Baltazar Leventhal, Alethia Huxley   Thu Apr 19, 2024  2:10 PM) Education needed on how to properly take   guaiFENesin  (MUCINEX ) 600 MG 12 hr tablet [id number]  Take 1 tablet (600 mg total) by mouth 2 (two) times daily. Vann, Jessica U, DO  Active   guaiFENesin -dextromethorphan  (ROBITUSSIN DM) 100-10 MG/5ML syrup [id number]  Take 10 mLs by mouth every 4 (four) hours as needed for cough. Vann, Jessica U, DO  Active   losartan -hydrochlorothiazide  (HYZAAR) 50-12.5 MG tablet [id number]  Take 1 tablet by mouth 2 (two) times daily.  Patient not taking: Reported on 04/19/2024   Cathey Clunes, MD  Active Self, Pharmacy Records, Multiple Informants  metoprolol  tartrate (LOPRESSOR ) 50 MG tablet [id number]  Take 1 tablet (50 mg total) by mouth 2 (two) times daily. Vann, Jessica U, DO  Active   predniSONE  (DELTASONE ) 20 MG tablet [id number]  Take 2 tablets (40 mg total) by mouth daily with breakfast. Vann, Jessica U, DO  Active   Med List Note Alline Ivans, CPhT 04/19/24 1411): History of intermittent  compliance             Home Care and Equipment/Supplies: Were Home Health Services Ordered?: NA Any new equipment or medical supplies ordered?: NA  Functional Questionnaire: Do you need assistance with  bathing/showering or dressing?: No Do you need assistance with meal preparation?: No Do you need assistance with eating?: No Do you have difficulty maintaining continence: No Do you need assistance with getting out of bed/getting out of a chair/moving?: No Do you have difficulty managing or taking your medications?: No  Follow up appointments reviewed: PCP Follow-up appointment confirmed?: No MD Provider Line Number:[phone] Given: No (Sent message to [facility] per protocol) Specialist Hospital Follow-up appointment confirmed?: NA Do you need transportation to your follow-up appointment?: No Do you understand care options if your condition(s) worsen?: Yes-patient verbalized understanding  SDOH Interventions Today    Flowsheet Row Most Recent Value  SDOH Interventions   Food Insecurity Interventions Intervention Not Indicated  Housing Interventions Intervention Not Indicated  Transportation Interventions Intervention Not Indicated  Utilities Interventions Intervention Not Indicated    Goals       Patient has right knee pain. she spoke of health management needs      Interventions:  Spoke with Shara Das, spouse of client,  via phone today. Discussed client needs and status Discussed medication procurement of client Discussed upcoming medical appointments of client Discussed sleeping issues of client. [name] said that client sleeps some during the day Discussed walking of client. Sometimes client is fatigued when walking. Client may get short of breath when walking. [name] said client is not using a device to help her in walking Regarding relaxation techniques of client, she likes to cook to help her relax. She also likes working with crafts to help her relax. She watches TV, listens to music, and likes to speak via phone with relatives and friends Discussed client transport needs. [name] said he transports client to and from client appointments Discussed  vision of client. She wears glasses to help her with vision.  Discussed program support with RN, LCSW, Pharmacist. Encouraged Kynsli to access program support services if needed Discussed client use of inhaler and nebulizer. [name] said that client uses nebulizer for treatment about one time per day. Discussed client support with PCP [name] said that daughters live in area and are supportive of client Discussed pain issues. [name] said client has knee pain and foot pain issues Lucius Sabins for phone call with LCSW today Encouraged client  or Marionette Meskill to call LCSW as needed for SW support for client at [phone].        patient spoke of  medical needs  and issues in dealing with anxiety (pt-stated)      Interventions:  LCSW talked via phone with Enolia Hartmann today about her needs. She spoke of medical needs. She said she had seen an oncologist previously for exam. Calianne said she wants a second  opinion of her health needs. She would like to see another oncologist.  . LCSW talked with client about program support for client with RN, Pharmacist and LCSW Provided counseling support for client.   Reviewed mood issues. Client said she feels anxious occasionally in managing medical needs. She had a history of cancer years ago and wants to be proactive in managing her current health needs. She likes to do crafts to help her relax. She enjoys cooking as well Reviewed medication procurement of client Reviewed walking challenges of client Reviewed support for client. She said  she has support from her spouse and from her daughter. Reviewed pain issues. She spoke of foot pain issues.  Discussed breathing challenges of client. She spoke of using nebulizer as prescribed for breathing treatment. She spoke of her use of inhaler as needed Discussed PCP support for client Discussed transport needs. She said that her spouse, [name] helps her with transport needs. She also said that her daughter  sometimes helps her with transport needs She spoke of the fact that her spouse is still working at present. Encouraged Akshitha to call LCSW as needed for SW support at [phone].        VBCI Transitions of Care (TOC) Care Plan      Problems:  Recent Hospitalization for treatment of COPD Medication access barrier The patient is on disability and states she cannot always afford her medication, Medication management barrier : The patient has a history of non-compliance, and No Hospital Follow Up Provider appointment CM to send a request for the provider to call the patient  Goal:  Over the next 30 days, the patient will not experience hospital readmission  Interventions:   COPD Interventions: Advised patient to track and manage COPD triggers Assessed social determinant of health barriers Discussed the importance of adequate rest and management of fatigue with COPD Provided instruction about proper use of medications used for management of COPD including inhalers Referral made to community resources care guide team for assistance with Pharmacy referral  Screening for signs and symptoms of depression related to chronic disease state   Patient Self Care Activities:  Attend all scheduled provider appointments Call pharmacy for medication refills 3-7 days in advance of running out of medications Call provider office for new concerns or questions  Notify RN Care Manager of [facility] call rescheduling needs Participate in Transition of Care Program/Attend [facility] scheduled calls Perform all self care activities independently  Take medications as prescribed    Plan:  Telephone follow up appointment with care management team member scheduled for:  Thursday June 19th at 2:30pm        Gareld June, BSN, RN [place]  VBCI - [facility] RN Care Manager [phone]

## 2024-04-24 ENCOUNTER — Ambulatory Visit: Payer: Self-pay

## 2024-04-24 ENCOUNTER — Telehealth: Payer: Self-pay

## 2024-04-24 NOTE — Telephone Encounter (Signed)
 Duplicate chart, please see other NT encounter

## 2024-04-24 NOTE — Telephone Encounter (Addendum)
 Numerous attempts to contact pt, no contact made. Routing to clinic.   Copied from CRM [id number]. Topic: Clinical - Pink Word Triage >> Apr 24, 2024  4:08 PM Carrielelia G wrote: Reason for Triage:  Patient wanted to order her nebulizer drops. But what I am reading to her she says is not what she needs. She states she is not having any breathing issues now but she is starting to panic a little, if she does not get the meds soon.   Please advise. >> Apr 24, 2024  4:45 PM Magdalene School wrote: Patient called stating that she has a refill available at the pharmacy and wanted to let us  know to disregard the request for albuterol  (PROVENTIL ) (2.5 MG/3ML) 0.083% nebulizer solution refill.  >> Apr 24, 2024  4:08 PM Carrielelia G wrote: Patient wanted to order her nebulizer drops. But what I am reading to her she says is not what she needs. She states she is not having any breathing issues now but she is starting to panic a little, if she does not get the meds soon.   Please advise.

## 2024-04-24 NOTE — Progress Notes (Addendum)
 Contacted patient to address medication access concerns noted during VBCI TOC call. Patient requested that we schedule an appointment for tomorrow to discuss further. Scheduled appointment for 11:30AM tomorrow morning. Requested that patient be in a location where she will be able to go through all her current medications. Patient agreeable to plan.   Instructed patient to call [facility] tomorrow morning to schedule hospital follow-up appointment. Provided her with clinic phone number.   Arthea Larsson, PharmD PGY1 Pharmacy Resident

## 2024-04-25 ENCOUNTER — Other Ambulatory Visit: Payer: Self-pay

## 2024-04-25 ENCOUNTER — Other Ambulatory Visit (HOSPITAL_COMMUNITY): Payer: Self-pay

## 2024-04-25 NOTE — Telephone Encounter (Signed)
 I called and spoke to the patient to schedule her a hfu, patient stated she will have to check her husbands schedule to see when she is able to come in. Patient stated she will give us  a call back to schedule an appointment.

## 2024-04-25 NOTE — Progress Notes (Unsigned)
 04/25/2024 Name: [name] MRN: [id number] DOB: 14-Aug-1951  No chief complaint on file.   [name] is a 73 y.o. year old female who presented for a telephone visit.   They were referred to the pharmacist by their Case Management Team  for assistance in managing medication access. PMH includes HTN, COPD, HLD, T2DM, osteoporosis, tobacco use disorder, anxiety.  Subjective: Patient was recently hospitalized at [facility] from 04/18/24 to 04/22/24 for a COPD exacerbation. She has frequent admissions for COPD. She also had severely elevated BP (SBP 200s) in the setting of not taking her home BP medications. She was instructed to continue her home bronchodilator and resume amlodipine , hydrochlorothiazide , losartan , and metoprolol  tartrate. She was also counseled on tobacco cessation.  Today, ***  Care Team: Primary Care Provider: Cathey Clunes, MD ; Next Scheduled Visit: needs to be scheduled {careteamprovider:27366}  Medication Access/Adherence  Current Pharmacy:  [facility] #[id number] [place] - [address] AT [address] [place] [address] Phone: [phone] Fax: [phone]  [facility] [address],  - [address] [place] [address] Phone: [phone] Fax: [phone]  [place] [facility] [address] [place] [address] Phone: [phone] Fax: [phone]   Patient reports affordability concerns with their medications: {YES/NO:21197} Patient reports access/transportation concerns to their pharmacy: {YES/NO:21197} Patient reports adherence concerns with their medications:  {YES/NO:21197} ***  Insurance: [facility] Medicare - [employer]  Trelegy not covered Breztri  preferred for $47 - [place] vs Medicare LIS  Hypertension:  Current medications: amlodipine  10 mg daily, losartan -hydrochlorothiazide  50-12.5 mg daily, metoprolol  tartrate  50 mg BID Medications previously tried:   Patient {HAS/DOES NOT XNAT:55732} a validated, automated, upper arm home BP cuff Current blood pressure readings readings: ***  Patient {Actions; denies-reports:120008} hypotensive s/sx including ***dizziness, lightheadedness.  Patient {Actions; denies-reports:120008} hypertensive symptoms including ***headache, chest pain, shortness of breath  Current meal patterns: ***  Current physical activity: ***   COPD:  Current medications: Trelegy Ellipta  (fluticasone -umeclidinium-vilanterol) 1 puff once daily Medications tried in the past:   Reports *** exacerbations in the past year  mMRC score: *** CAT score: ***  Current medication access support: *** Medication Management:  Current adherence strategy: ***  Patient reports {Good Fair Poor:[phone]} adherence to medications  Patient reports the following barriers to adherence: ***  Recent fill dates:    Objective:  Lab Results  Component Value Date   HGBA1C 5.4 03/19/2024    Lab Results  Component Value Date   CREATININE 0.73 04/20/2024   BUN 13 04/20/2024   NA 139 04/20/2024   K 4.4 04/20/2024   CL 104 04/20/2024   CO2 21 (L) 04/20/2024    Lab Results  Component Value Date   CHOL 169 03/19/2024   HDL 64 03/19/2024   LDLCALC 84 03/19/2024   TRIG 120 03/19/2024   CHOLHDL 2.6 03/19/2024    Medications Reviewed Today   Medications were not reviewed in this encounter       Assessment/Plan:   Hypertension: - Currently {CHL Controlled/Uncontrolled:[phone]} - Reviewed long term cardiovascular and renal outcomes of uncontrolled blood pressure - Reviewed appropriate blood pressure monitoring technique and reviewed goal blood pressure. Recommended to check home blood pressure and heart rate *** - Recommend to ***      COPD: - Currently {CHL Controlled/Uncontrolled:[phone]}.  - Reviewed appropriate inhaler technique. - Recommend to ***  - Meets  financial criteria for *** patient assistance  program through ***. Will collaborate with provider, CPhT, and patient to pursue assistance.     Medication Management: - Currently strategy {sufficient/insufficient:26424} to maintain appropriate adherence to prescribed medication regimen - Suggested use of weekly pill box to organize medications - Created list of medication, indication, and administration time. Provided to patient - Discussed collaboration with local pharmacies for adherence packaging. Reviewed local pharmacies with adherence packaging options. Patient elects to ***    Follow Up Plan: ***  ***

## 2024-04-26 ENCOUNTER — Other Ambulatory Visit: Payer: Self-pay

## 2024-04-26 NOTE — Patient Instructions (Signed)
 Visit Information  Thank you for taking time to visit with me today. Please don't hesitate to contact me if I can be of assistance to you before our next scheduled telephone appointment.  Our next appointment is by telephone on Thursday June 26th at 1:30pm  Following is a copy of your care plan:   Goals Addressed             This Visit's Progress    VBCI Transitions of Care (TOC) Care Plan       Problems: (reviewed 04/26/24) Recent Hospitalization for treatment of COPD Medication access barrier The patient is on disability and states she cannot always afford her medication, Medication management barrier : The patient has a history of non-compliance, and No Hospital Follow Up Provider appointment CM to send a request for the provider to call the patient 04/26/24 - The provider called the patient and she requested a call back because she needed to talk with her husband 04/26/24 - The VBCI pharmacy provider reached out the patient to assist with medication compliance. The patient states she doesn't remember talking to her. The note is incomplete in Epic  Goal: (reviewed 04/26/24) Over the next 30 days, the patient will not experience hospital readmission  Interventions: (reviewed 04/26/24)  COPD Interventions: (reviewed 04/26/24) Advised patient to track and manage COPD triggers Assessed social determinant of health barriers Discussed the importance of adequate rest and management of fatigue with COPD Provided instruction about proper use of medications used for management of COPD including inhalers Referral made to community resources care guide team for assistance with Pharmacy referral: Completed 04/25/24  Screening for signs and symptoms of depression related to chronic disease state   Patient Self Care Activities: (reviewed 04/26/24) Attend all scheduled provider appointments Call pharmacy for medication refills 3-7 days in advance of running out of medications Call provider office for  new concerns or questions  Notify RN Care Manager of [facility] call rescheduling needs Participate in Transition of Care Program/Attend [facility] scheduled calls Perform all self care activities independently  Take medications as prescribed    Plan:  Telephone follow up appointment with care management team member scheduled for:  Thursday June 26th at 1:30pm        Patient verbalizes understanding of instructions and care plan provided today and agrees to view in MyChart. Active MyChart status and patient understanding of how to access instructions and care plan via MyChart confirmed with patient.     The patient has been provided with contact information for the care management team and has been advised to call with any health related questions or concerns.   Please call the care guide team at [phone] if you need to cancel or reschedule your appointment.   Please call the Suicide and Crisis Lifeline: 988 call the [place]  National Suicide Prevention Lifeline: 684-643-9891 or TTY: 610-308-8854 TTY (530)395-2088) to talk to a trained counselor if you are experiencing a Mental Health or Behavioral Health Crisis or need someone to talk to.  Gareld June, BSN, RN [place]  VBCI - [employer] [facility] RN Care Manager [phone]

## 2024-04-26 NOTE — Transitions of Care (Post Inpatient/ED Visit) (Signed)
 Transition of Care week 1  Visit Note  04/26/2024  Name: [name] MRN: [id number]          DOB: 05-Apr-1951  Situation: Patient enrolled in [facility] 30-day program. Visit completed with Enolia Hartmann by telephone.   Background:     Past Medical History:  Diagnosis Date   Acute sinusitis 01/09/2019   Acute sinusitis 01/09/2019   Anxiety    Arthritis    fingers, knees (08/16/2018)   Asthma    Atopic dermatitis 03/20/2018   Axillary hidradenitis suppurativa 10/13/2018   Cancer of right breast (HCC) 1991   s/p lumpectomy, chemotherapy and radiation therapy in 1991. Mammogram in 2007 was normal.   Cellulitis of buttock, left 12/13/2022   Constipated    h/o   COPD (chronic obstructive pulmonary disease) (HCC)    History of multiple hospital admissions for exercabation    COPD exacerbation (HCC) 01/01/2019   COPD exacerbation (HCC) 02/17/2024   COPD with acute exacerbation (HCC) 01/14/2019   COPD with exacerbation (HCC) 04/06/2009   Qualifier: Diagnosis of  By: Derald Flattery MD, Vijay     Depression    Diarrhea    h/o   Facial cellulitis 08/30/2022   Facial edema 08/31/2022   GERD (gastroesophageal reflux disease)    Grade I diastolic dysfunction 01/12/2024   Headache    a few times/month (08/16/2018   Heart murmur 10/05/2011   first time I ever heard I had one was today   History of breast cancer 12/01/2012   Pt with h/o breast CA s/p lumpectomy with chemo/radiation in 1991. Pt mammogram 2011 was unremarkable. Had CT chest 10/2012 in ED for SOB and showed spiculated nodule with lymph node. 12/04/12: Birads 2; repeat diagnostic mammogram in 1 year.     Hyperlipidemia    Hypertension    Lower extremity edema 09/21/2018   Obesity    Personal history of chemotherapy    Personal history of radiation therapy    Pneumonia    couple times in the last 10-15 yrs (08/16/2018)   QT prolongation 08/08/2014   Seasonal allergies 02/25/2017   Shortness of breath 10/05/2011    at rest; lying down; w/exertion   Sigmoid diverticulitis 80/2008   Tobacco abuse    Type 2 diabetes mellitus (HCC) 05/14/2009   Type II diabetes mellitus (HCC)     Assessment: Patient Reported Symptoms: Cognitive Cognitive Status: Alert and oriented to person, place, and time      Neurological Neurological Review of Symptoms: No symptoms reported    HEENT        Cardiovascular Cardiovascular Symptoms Reported: No symptoms reported Cardiovascular Conditions: Hypertension, Heart failure Cardiovascular Comment: The patient does not weigh and does not manage her BP  Respiratory Respiratory Symptoms Reported: Productive cough, Shortness of breath Additional Respiratory Details: The patient did pick up her nebulizer medication and her Prednisone . She continues to smoke and states she is trying to cut back. Respiratory Conditions: COPD, Cough, Seasonal allergies  Endocrine Patient reports the following symptoms related to hypoglycemia or hyperglycemia : Not assessed    Gastrointestinal Gastrointestinal Symptoms Reported: Not assessed      Genitourinary Genitourinary Symptoms Reported: Not assessed    Integumentary Integumentary Symptoms Reported: Not assessed    Musculoskeletal Musculoskelatal Symptoms Reviewed: Not assessed        Psychosocial Psychosocial Symptoms Reported: Not assessed         There were no vitals filed for this visit.  Medications Reviewed Today     Reviewed by  Javius Sylla, Candice Chalet, Charity fundraiser (Case Production designer, theatre/television/film) on 04/26/24 at 1533  Med List Status: <None>   Medication Order Taking? Sig Documenting Provider Last Dose Status Informant  acetaminophen  (TYLENOL ) 325 MG tablet [id number]  Take 2 tablets (650 mg total) by mouth every 6 (six) hours as needed for mild pain (or Fever >/= 101). Joette Mustard, MD  Active Self, Pharmacy Records, Multiple Informants  albuterol  (PROVENTIL ) (2.5 MG/3ML) 0.083% nebulizer solution [id number] Yes Take 3 mLs (2.5 mg total) by  nebulization every 4 (four) hours as needed for wheezing or shortness of breath. Gomez-Caraballo, Maria, MD  Active   albuterol  (VENTOLIN  HFA) 108 [phone] Base) MCG/ACT inhaler [id number]  Inhale 2 puffs into the lungs every 6 (six) hours as needed for wheezing or shortness of breath. Cleven Dallas, DO  Active Self, Pharmacy Records, Multiple Informants  amLODipine  (NORVASC ) 10 MG tablet [id number]  Take 1 tablet (10 mg total) by mouth daily.  Patient not taking: Reported on 04/19/2024   Jackolyn Masker, MD  Active Self, Pharmacy Records, Multiple Informants  Fluticasone -Umeclidin-Vilant (TRELEGY ELLIPTA ) 100-62.5-25 MCG/ACT AEPB [id number]  Inhale 1 puff into the lungs daily.  Patient taking differently: Inhale 1 puff into the lungs daily as needed (wheezing/SOB).   Jackolyn Masker, MD  Active Self, Pharmacy Records, Multiple Informants           Med Note Baltazar Leventhal, Alethia Huxley   Thu Apr 19, 2024  2:10 PM) Education needed on how to properly take   guaiFENesin  (MUCINEX ) 600 MG 12 hr tablet [id number]  Take 1 tablet (600 mg total) by mouth 2 (two) times daily. Vann, Jessica U, DO  Active   guaiFENesin -dextromethorphan  (ROBITUSSIN DM) 100-10 MG/5ML syrup [id number]  Take 10 mLs by mouth every 4 (four) hours as needed for cough. Vann, Jessica U, DO  Active   losartan -hydrochlorothiazide  (HYZAAR) 50-12.5 MG tablet [id number]  Take 1 tablet by mouth 2 (two) times daily.  Patient not taking: Reported on 04/19/2024   Cathey Clunes, MD  Active Self, Pharmacy Records, Multiple Informants  metoprolol  tartrate (LOPRESSOR ) 50 MG tablet [id number]  Take 1 tablet (50 mg total) by mouth 2 (two) times daily. Vann, Jessica U, DO  Active   predniSONE  (DELTASONE ) 20 MG tablet [id number] Yes Take 2 tablets (40 mg total) by mouth daily with breakfast. Vann, Jessica U, DO  Active   Med List Note Alline Ivans, CPhT 04/19/24 1411): History of intermittent compliance             Recommendation:   Continue Current  Plan of Care  Follow Up Plan:   Closing From:  Transitions of Care Program  [facility], BSN, RN [place]  VBCI - [facility] RN Care Manager [phone]

## 2024-04-27 ENCOUNTER — Telehealth: Payer: Self-pay

## 2024-04-27 NOTE — Telephone Encounter (Signed)
Attempted to contact patient for scheduled appointment for medication management. Left HIPAA compliant message for patient to return my call at their convenience.    Nils Pyle, PharmD PGY1 Pharmacy Resident

## 2024-05-03 ENCOUNTER — Other Ambulatory Visit: Payer: Self-pay

## 2024-05-03 NOTE — Transitions of Care (Post Inpatient/ED Visit) (Signed)
 Transition of Care week 2  Visit Note  05/03/2024  Name: [name] MRN: [id number]          DOB: 12-03-1950  Situation: Patient enrolled in [facility] 30-day program. Visit completed with Barnie Lady by telephone.   Background:     Past Medical History:  Diagnosis Date   Acute sinusitis 01/09/2019   Acute sinusitis 01/09/2019   Anxiety    Arthritis    fingers, knees (08/16/2018)   Asthma    Atopic dermatitis 03/20/2018   Axillary hidradenitis suppurativa 10/13/2018   Cancer of right breast (HCC) 1991   s/p lumpectomy, chemotherapy and radiation therapy in 1991. Mammogram in 2007 was normal.   Cellulitis of buttock, left 12/13/2022   Constipated    h/o   COPD (chronic obstructive pulmonary disease) (HCC)    History of multiple hospital admissions for exercabation    COPD exacerbation (HCC) 01/01/2019   COPD exacerbation (HCC) 02/17/2024   COPD with acute exacerbation (HCC) 01/14/2019   COPD with exacerbation (HCC) 04/06/2009   Qualifier: Diagnosis of  By: Loletta MD, Vijay     Depression    Diarrhea    h/o   Facial cellulitis 08/30/2022   Facial edema 08/31/2022   GERD (gastroesophageal reflux disease)    Grade I diastolic dysfunction 01/12/2024   Headache    a few times/month (08/16/2018   Heart murmur 10/05/2011   first time I ever heard I had one was today   History of breast cancer 12/01/2012   Pt with h/o breast CA s/p lumpectomy with chemo/radiation in 1991. Pt mammogram 2011 was unremarkable. Had CT chest 10/2012 in ED for SOB and showed spiculated nodule with lymph node. 12/04/12: Birads 2; repeat diagnostic mammogram in 1 year.     Hyperlipidemia    Hypertension    Lower extremity edema 09/21/2018   Obesity    Personal history of chemotherapy    Personal history of radiation therapy    Pneumonia    couple times in the last 10-15 yrs (08/16/2018)   QT prolongation 08/08/2014   Seasonal allergies 02/25/2017   Shortness of breath 10/05/2011    at rest; lying down; w/exertion   Sigmoid diverticulitis 80/2008   Tobacco abuse    Type 2 diabetes mellitus (HCC) 05/14/2009   Type II diabetes mellitus (HCC)     Assessment: Patient Reported Symptoms: Cognitive Cognitive Status: Alert and oriented to person, place, and time      Neurological Neurological Review of Symptoms: No symptoms reported    HEENT HEENT Symptoms Reported: No symptoms reported      Cardiovascular Cardiovascular Symptoms Reported: No symptoms reported Cardiovascular Conditions: Hypertension, Heart failure Cardiovascular Management Strategies: Medication therapy, Routine screening Weight: 157 lb (71.2 kg) Cardiovascular Comment: The patient does not weigh herself and she does not monitor her BP. She has expired medications and has not filled prescriptions. A phramacy referral was placed. The patient did not answer the phone  Respiratory Respiratory Symptoms Reported: Productive cough Additional Respiratory Details: The patient has her nebulizer and medications. Encouraged use of Mucinex . She has taken her last Prednisone  tablet today. Respiratory Conditions: COPD, Cough, Seasonal allergies Respiratory Self-Management Outcome: 3 (uncertain) Respiratory Comment: The patient does not utilize medications prescribed for her for COPD. She does not always pick up medications from the pharmacy. She doesn't always seem to understand her disease process and how to avoid exacerbations  Endocrine Patient reports the following symptoms related to hypoglycemia or hyperglycemia : No symptoms reported Is patient  diabetic?: Yes Is patient checking blood sugars at home?: No Endocrine Conditions: Diabetes Endocrine Management Strategies: Diet modification Endocrine Self-Management Outcome: 4 (good) Endocrine Comment: The patient is not on medication for her Diabetes  Gastrointestinal Gastrointestinal Symptoms Reported: No symptoms reported      Genitourinary Genitourinary  Symptoms Reported: No symptoms reported    Integumentary Integumentary Symptoms Reported: No symptoms reported    Musculoskeletal Musculoskelatal Symptoms Reviewed: No symptoms reported   Falls in the past year?: No Number of falls in past year: 1 or less Was there an injury with Fall?: No Fall Risk Category Calculator: 0 Patient Fall Risk Level: Low Fall Risk    Psychosocial Psychosocial Symptoms Reported: No symptoms reported         There were no vitals filed for this visit.  Medications Reviewed Today     Reviewed by [name], RN (Case Manager) on 05/03/24 at 1347  Med List Status: <None>   Medication Order Taking? Sig Documenting Provider Last Dose Status Informant  acetaminophen  (TYLENOL ) 325 MG tablet [id number]  Take 2 tablets (650 mg total) by mouth every 6 (six) hours as needed for mild pain (or Fever >/= 101). Franchot Novel, MD  Active Self, Pharmacy Records, Multiple Informants  albuterol  (PROVENTIL ) (2.5 MG/3ML) 0.083% nebulizer solution [id number]  Take 3 mLs (2.5 mg total) by nebulization every 4 (four) hours as needed for wheezing or shortness of breath. Gomez-Caraballo, Maria, MD  Active   albuterol  (VENTOLIN  HFA) 108 [phone] Base) MCG/ACT inhaler [id number]  Inhale 2 puffs into the lungs every 6 (six) hours as needed for wheezing or shortness of breath. Jolaine Pac, DO  Active Self, Pharmacy Records, Multiple Informants  amLODipine  (NORVASC ) 10 MG tablet [id number]  Take 1 tablet (10 mg total) by mouth daily.  Patient not taking: Reported on 05/03/2024   Fernand Prost, MD  Active Self, Pharmacy Records, Multiple Informants  Fluticasone -Umeclidin-Vilant (TRELEGY ELLIPTA ) 100-62.5-25 MCG/ACT AEPB [id number]  Inhale 1 puff into the lungs daily.  Patient not taking: Reported on 05/03/2024   Fernand Prost, MD  Active Self, Pharmacy Records, Multiple Informants           Med Note STEFFI, ADELITA   Thu Apr 19, 2024  2:10 PM) Education needed on how to properly  take   guaiFENesin  (MUCINEX ) 600 MG 12 hr tablet [id number]  Take 1 tablet (600 mg total) by mouth 2 (two) times daily.  Patient not taking: Reported on 05/03/2024   Vann, Jessica U, DO  Active   guaiFENesin -dextromethorphan  (ROBITUSSIN DM) 100-10 MG/5ML syrup [id number] Yes Take 10 mLs by mouth every 4 (four) hours as needed for cough. Vann, Jessica U, DO  Active   losartan -hydrochlorothiazide  (HYZAAR) 50-12.5 MG tablet [id number]  Take 1 tablet by mouth 2 (two) times daily.  Patient not taking: Reported on 05/03/2024   Elnora Ip, MD  Active Self, Pharmacy Records, Multiple Informants  metoprolol  tartrate (LOPRESSOR ) 50 MG tablet [id number] Yes Take 1 tablet (50 mg total) by mouth 2 (two) times daily. Vann, Jessica U, DO  Active   predniSONE  (DELTASONE ) 20 MG tablet [id number] Yes Take 2 tablets (40 mg total) by mouth daily with breakfast. Vann, Jessica U, DO  Active   Med List Note STEFFI ADELITA, CPhT 04/19/24 1411): History of intermittent compliance             Recommendation:   Continue Current Plan of Care  Follow Up Plan:   Telephone follow-up in 1 week  Medford Moises, BSN, RN [facility]  VBCI -  Population Health RN Care Manager [phone]

## 2024-05-03 NOTE — Patient Instructions (Signed)
 Visit Information  Thank you for taking time to visit with me today. Please don't hesitate to contact me if I can be of assistance to you before our next scheduled telephone appointment.  Our next appointment is by telephone on Wednesday July 2nd at 1:00pm  Following is a copy of your care plan:   Goals Addressed             This Visit's Progress    VBCI Transitions of Care (TOC) Care Plan       Problems: (reviewed 05/03/24) Recent Hospitalization for treatment of COPD Medication access barrier The patient is on disability and states she cannot always afford her medication, Medication management barrier : The patient has a history of non-compliance, and No Hospital Follow Up Provider appointment CM to send a request for the provider to call the patient 04/26/24 - The provider called the patient and she requested a call back because she needed to talk with her husband 04/26/24 - The VBCI pharmacy provider reached out the patient to assist with medication compliance. The patient states she doesn't remember talking to her. The note is incomplete in Epic. 04/27/24 - The pharmacist reached out to her for a scheduled appointment on 04/27/24 and the patient did not answer the phone 05/03/24 - The patient has an appointment with PCP 05/08/24  Goal: (reviewed 05/03/24) Over the next 30 days, the patient will not experience hospital readmission  Interventions: (reviewed 05/03/24)  COPD Interventions: (reviewed 05/03/24) Advised patient to track and manage COPD triggers Assessed social determinant of health barriers Discussed the importance of adequate rest and management of fatigue with COPD Provided instruction about proper use of medications used for management of COPD including inhalers Referral made to community resources care guide team for assistance with Pharmacy referral: Completed 04/25/24  Screening for signs and symptoms of depression related to chronic disease state  Educate the patient on  the medications such as Mucinex , Prednisone  and inhalers that aide in COPD triggers  Patient Self Care Activities: (reviewed 05/03/24) Attend all scheduled provider appointments Call pharmacy for medication refills 3-7 days in advance of running out of medications Call provider office for new concerns or questions  Notify RN Care Manager of [facility] call rescheduling needs Participate in Transition of Care Program/Attend [facility] scheduled calls Perform all self care activities independently  Take medications as prescribed   Complete Pharmacy referral - return the phone call to the pharmacist  Plan:  Telephone follow up appointment with care management team member scheduled for:  Wednesday July 2nd at 1:00pm        Patient verbalizes understanding of instructions and care plan provided today and agrees to view in MyChart. Active MyChart status and patient understanding of how to access instructions and care plan via MyChart confirmed with patient.     The patient has been provided with contact information for the care management team and has been advised to call with any health related questions or concerns.   Please call the care guide team at [phone] if you need to cancel or reschedule your appointment.   Please call the Suicide and Crisis Lifeline: 988 call the [place]  National Suicide Prevention Lifeline: (859)211-0783 or TTY: 782-302-0856 TTY (947)308-6492) to talk to a trained counselor if you are experiencing a Mental Health or Behavioral Health Crisis or need someone to talk to.   Medford Balboa, BSN, RN [place]  VBCI - [employer] [facility] RN Care Manager [phone]

## 2024-05-07 ENCOUNTER — Telehealth: Payer: Self-pay | Admitting: Pharmacist

## 2024-05-07 NOTE — Assessment & Plan Note (Deleted)
-  pt currently smoking ~1ppd of cigarettes -counseled pt on smoking cessation -offer nicotine  patch

## 2024-05-07 NOTE — Progress Notes (Signed)
 Contacted patient regarding referral for medication access from [facility] RN CM.   Left patient a voicemail to return my call at their convenience  [facility]. Would recommend sending prescriptions to the Pharmacy at [facility] for support with medication access. Appears Trelegy is the only brand name medication she is on that would have a patient assistance program, but it requires her to have spent at least $600 on prescription copays this calendar year. Would recommend changing to Breztri  to pursue patient assistance.     Catie IVAR Centers, PharmD, [facility] Clinical Pharmacist [phone]

## 2024-05-07 NOTE — Assessment & Plan Note (Deleted)
-  recent exacerbation leading to a hospitalization lasting 6/11-6/15/25 that required elevation in supplemental O2 -completed course of PO steroids, reports non-adherence to COPD regimen of albuterol  inhaler -due to medication access issues, will try to switch patient to Breztri  (budesonide /glycopyrrolate /formoterol )

## 2024-05-07 NOTE — Progress Notes (Incomplete)
   Established Patient Office Visit  Subjective   Patient ID: [name], female    DOB: 09/16/1951  Age: 73 y.o. MRN: [id number]  No chief complaint on file.   HPI  [name] is a 73yof with pmhx of COPD, hypertension, and tobacco use disorder who presents for follow-up after a hospitalization for COPD exacerbation. She reports her symptoms have been well-controlled.  Medication adherence - why are you not taking bp medications?  Respiratory sx: SOB, cough (productive?), wheezing, DOE, CP HTN urgency: changes in vision, lightheadedness/dizziness, blood in urine?  Follow up on patient's ACE inhibitor allergy  Tobacco cessation - are you still smoking? Nicotine  patch  Follow up Hospitalization  Patient was admitted to [facility] on 04/18/24 and discharged on 04/22/24. She was treated for COPD Exacerbation. Treatment for this included O2, IV ? PO steroids, azithromycin  500mg  tid. Telephone follow up was done on 05/03/24 She reports adequate compliance with treatment. She reports this condition is stayed the same.  ----------------------------------------------------------------------------------------- -     {History (Optional):23778}  ROS    Objective:     There were no vitals taken for this visit. {Vitals History (Optional):23777}  Physical Exam   No results found for any visits on 05/08/24.  {Labs (Optional):23779}  The 10-year ASCVD risk score (Arnett DK, et al., 2019) is: 51.1%    Assessment & Plan:   Problem List Items Addressed This Visit       Cardiovascular and Mediastinum   Hypertensive urgency   -systolic BP up to 170-200s while in ED/inpatient unit -inpatient regimen: Losartan -hydrochlorothiazide  50-12.5mg , amlodipine  10mg , metoprolol  tartrate 50mg ; patient has not been taking these -ASCVD Risk score of 52.8% - pt previously taking simvastatin  40mg  daily        Respiratory   COPD (chronic obstructive pulmonary disease) (HCC) -  Primary   -recent exacerbation leading to a hospitalization lasting 6/11-6/15/25 that required elevation in supplemental O2 -completed course of PO steroids, reports non-adherence to COPD regimen of albuterol  inhaler -due to medication access issues, will try to switch patient to Breztri  (budesonide /glycopyrrolate /formoterol )        Other   Tobacco use disorder   -pt currently smoking ~1ppd of cigarettes -counseled pt on smoking cessation -offer nicotine  patch       No follow-ups on file.      Hatim Homann, MD

## 2024-05-07 NOTE — Assessment & Plan Note (Deleted)
-  systolic BP up to 170-200s while in ED/inpatient unit -inpatient regimen: Losartan -hydrochlorothiazide  50-12.5mg , amlodipine  10mg , metoprolol  tartrate 50mg ; patient has not been taking these -ASCVD Risk score of 52.8% - pt previously taking simvastatin  40mg  daily

## 2024-05-08 ENCOUNTER — Ambulatory Visit

## 2024-05-08 ENCOUNTER — Telehealth

## 2024-05-08 ENCOUNTER — Telehealth: Payer: Self-pay | Admitting: Student

## 2024-05-08 DIAGNOSIS — F17200 Nicotine dependence, unspecified, uncomplicated: Secondary | ICD-10-CM

## 2024-05-08 DIAGNOSIS — J449 Chronic obstructive pulmonary disease, unspecified: Secondary | ICD-10-CM

## 2024-05-08 DIAGNOSIS — I160 Hypertensive urgency: Secondary | ICD-10-CM

## 2024-05-08 NOTE — Telephone Encounter (Signed)
 Patients appointment has been rescheduled to Monday 7/7 with Dr.Tan. The patient is being seen for a HFU and will need to be seen in person. Per Dr.Tan he doesn't think it a telehealth would be appropriate.

## 2024-05-08 NOTE — Assessment & Plan Note (Deleted)
-  systolic BP up to 170-200s while in ED/inpatient unit -home regimen: Losartan -hydrochlorothiazide  50-12.5mg , amlodipine  10mg , metoprolol  tartrate 50mg ; patient has not been taking these -ASCVD Risk score of 51.1% - pt previously taking simvastatin  40mg  daily

## 2024-05-08 NOTE — Telephone Encounter (Signed)
 Please refer to message  below.  Copied from CRM [id number]. Topic: Appointments - Appointment Cancel/Reschedule >> May 08, 2024  7:59 AM Carmell R wrote: Patient stated that she would not be able to come in for her appointment today, but would like to do it over the telephone instead. Refer to attachments for appointment information. Please follow up with patient. [phone].

## 2024-05-09 ENCOUNTER — Telehealth: Payer: Self-pay | Admitting: *Deleted

## 2024-05-09 ENCOUNTER — Other Ambulatory Visit: Payer: Self-pay

## 2024-05-09 NOTE — Patient Instructions (Signed)
 Visit Information  Thank you for taking time to visit with me today. Please don't hesitate to contact me if I can be of assistance to you before our next scheduled telephone appointment.  Our next appointment is by telephone on Thursday July 10th at 1:00pm  Following is a copy of your care plan:   Goals Addressed             This Visit's Progress    [facility]I Transitions of Care (TOC) Care Plan       Problems: (reviewed 05/09/24) Recent Hospitalization for treatment of COPD Medication access barrier The patient is on disability and states she cannot always afford her medication, Medication management barrier : The patient has a history of non-compliance, and No Hospital Follow Up Provider appointment CM to send a request for the provider to call the patient 04/26/24 - The provider called the patient and she requested a call back because she needed to talk with her husband 04/26/24 - The [facility] pharmacy provider reached out the patient to assist with medication compliance. The patient states she doesn't remember talking to her. The note is incomplete in Epic. 04/27/24 - The pharmacist reached out to her for a scheduled appointment on 04/27/24 and the patient did not answer the phone 05/03/24 - The patient has an appointment with PCP 05/08/24 05/09/24 - The patient cancelled the appointment and rescheduled for 05/14/24. Confirmed transportation  Goal: (reviewed 05/09/24) Over the next 30 days, the patient will not experience hospital readmission  Interventions: (reviewed 05/09/24)  COPD Interventions: (reviewed 05/09/24) Advised patient to track and manage COPD triggers Assessed social determinant of health barriers Discussed the importance of adequate rest and management of fatigue with COPD Provided instruction about proper use of medications used for management of COPD including inhalers Referral made to community resources care guide team for assistance with Pharmacy referral: Completed 04/25/24 - The  pharmacy team has not connected with the patient. They have left messages and the patient has not returned calls.   Screening for signs and symptoms of depression related to chronic disease state  Educate the patient on the medications such as Mucinex , Prednisone  and inhalers that aide in COPD triggers  Patient Self Care Activities: (reviewed 05/09/24) Attend all scheduled provider appointments Call pharmacy for medication refills 3-7 days in advance of running out of medications Call provider office for new concerns or questions  Notify RN Care Manager of [facility] call rescheduling needs Participate in Transition of Care Program/Attend [facility] scheduled calls Perform all self care activities independently  Take medications as prescribed   Complete Pharmacy referral - return the phone call to the pharmacist - 05/09/24 - Not completed  Plan:  Telephone follow up appointment with care management team member scheduled for:  Thursday July 10th at 1:00pm        Patient verbalizes understanding of instructions and care plan provided today and agrees to view in MyChart. Active MyChart status and patient understanding of how to access instructions and care plan via MyChart confirmed with patient.     The patient has been provided with contact information for the care management team and has been advised to call with any health related questions or concerns.   Please call the care guide team at [phone] if you need to cancel or reschedule your appointment.   Please call the Suicide and Crisis Lifeline: 988 call the [place]  National Suicide Prevention Lifeline: 843-493-6290 or TTY: 732 415 9337 TTY 239 588 7341) to talk to a trained counselor if you are  experiencing a Mental Health or Behavioral Health Crisis or need someone to talk to.  Medford Balboa, BSN, RN [place]  VBCI - [employer] [facility] RN Care Manager [phone]

## 2024-05-09 NOTE — Transitions of Care (Post Inpatient/ED Visit) (Signed)
 Transition of Care week 3  Visit Note  05/09/2024  Name: [name] MRN: [id number]          DOB: 1951/06/01  Situation: Patient enrolled in [facility] 30-day program. Visit completed with [name] by telephone.   Background:     Past Medical History:  Diagnosis Date   Acute sinusitis 01/09/2019   Acute sinusitis 01/09/2019   Anxiety    Arthritis    fingers, knees (08/16/2018)   Asthma    Atopic dermatitis 03/20/2018   Axillary hidradenitis suppurativa 10/13/2018   Cancer of right breast (HCC) 1991   s/p lumpectomy, chemotherapy and radiation therapy in 1991. Mammogram in 2007 was normal.   Cellulitis of buttock, left 12/13/2022   Constipated    h/o   COPD (chronic obstructive pulmonary disease) (HCC)    History of multiple hospital admissions for exercabation    COPD exacerbation (HCC) 01/01/2019   COPD exacerbation (HCC) 02/17/2024   COPD with acute exacerbation (HCC) 01/14/2019   COPD with exacerbation (HCC) 04/06/2009   Qualifier: Diagnosis of  By: Loletta MD, Vijay     Depression    Diarrhea    h/o   Facial cellulitis 08/30/2022   Facial edema 08/31/2022   GERD (gastroesophageal reflux disease)    Grade I diastolic dysfunction 01/12/2024   Headache    a few times/month (08/16/2018   Heart murmur 10/05/2011   first time I ever heard I had one was today   History of breast cancer 12/01/2012   Pt with h/o breast CA s/p lumpectomy with chemo/radiation in 1991. Pt mammogram 2011 was unremarkable. Had CT chest 10/2012 in ED for SOB and showed spiculated nodule with lymph node. 12/04/12: Birads 2; repeat diagnostic mammogram in 1 year.     Hyperlipidemia    Hypertension    Lower extremity edema 09/21/2018   Obesity    Personal history of chemotherapy    Personal history of radiation therapy    Pneumonia    couple times in the last 10-15 yrs (08/16/2018)   QT prolongation 08/08/2014   Seasonal allergies 02/25/2017   Shortness of breath 10/05/2011    at rest; lying down; w/exertion   Sigmoid diverticulitis 80/2008   Tobacco abuse    Type 2 diabetes mellitus (HCC) 05/14/2009   Type II diabetes mellitus (HCC)     Assessment: Patient Reported Symptoms: Cognitive Cognitive Status: Alert and oriented to person, place, and time      Neurological Neurological Review of Symptoms: No symptoms reported    HEENT HEENT Symptoms Reported: No symptoms reported      Cardiovascular Cardiovascular Symptoms Reported: No symptoms reported Cardiovascular Management Strategies: Medication therapy, Routine screening Weight: 157 lb (71.2 kg) Cardiovascular Self-Management Outcome: 3 (uncertain) Cardiovascular Comment: The patient does not weigh herself and does not monitor her BP medications. She has not been able to connect with the pharmacist  Respiratory Respiratory Symptoms Reported: Productive cough Respiratory Management Strategies: Medication therapy Respiratory Self-Management Outcome: 3 (uncertain)  Endocrine Endocrine Symptoms Reported: No symptoms reported Is patient diabetic?: Yes Is patient checking blood sugars at home?: No Endocrine Self-Management Outcome: 4 (good) Endocrine Comment: The patient is not on medication for her Diabetes  Gastrointestinal Gastrointestinal Symptoms Reported: No symptoms reported      Genitourinary Genitourinary Symptoms Reported: No symptoms reported    Integumentary Integumentary Symptoms Reported: No symptoms reported    Musculoskeletal Musculoskelatal Symptoms Reviewed: No symptoms reported        Psychosocial Psychosocial Symptoms Reported: No symptoms  reported         There were no vitals filed for this visit.  Medications Reviewed Today     Reviewed by [name], RN (Case Manager) on 05/09/24 at 1333  Med List Status: <None>   Medication Order Taking? Sig Documenting Provider Last Dose Status Informant  acetaminophen  (TYLENOL ) 325 MG tablet [id number]  Take 2 tablets (650  mg total) by mouth every 6 (six) hours as needed for mild pain (or Fever >/= 101). Franchot Novel, MD  Active Self, Pharmacy Records, Multiple Informants  albuterol  (PROVENTIL ) (2.5 MG/3ML) 0.083% nebulizer solution [id number]  Take 3 mLs (2.5 mg total) by nebulization every 4 (four) hours as needed for wheezing or shortness of breath. Gomez-Caraballo, Maria, MD  Active   albuterol  (VENTOLIN  HFA) 108 [phone] Base) MCG/ACT inhaler [id number]  Inhale 2 puffs into the lungs every 6 (six) hours as needed for wheezing or shortness of breath. Jolaine Pac, DO  Active Self, Pharmacy Records, Multiple Informants  amLODipine  (NORVASC ) 10 MG tablet [id number]  Take 1 tablet (10 mg total) by mouth daily.  Patient not taking: Reported on 05/03/2024   Fernand Prost, MD  Active Self, Pharmacy Records, Multiple Informants  Fluticasone -Umeclidin-Vilant (TRELEGY ELLIPTA ) 100-62.5-25 MCG/ACT AEPB [id number]  Inhale 1 puff into the lungs daily.  Patient not taking: Reported on 05/03/2024   Fernand Prost, MD  Active Self, Pharmacy Records, Multiple Informants           Med Note STEFFI, ADELITA   Thu Apr 19, 2024  2:10 PM) Education needed on how to properly take   guaiFENesin  (MUCINEX ) 600 MG 12 hr tablet [id number]  Take 1 tablet (600 mg total) by mouth 2 (two) times daily.  Patient not taking: Reported on 05/03/2024   Vann, Jessica U, DO  Active   guaiFENesin -dextromethorphan  (ROBITUSSIN DM) 100-10 MG/5ML syrup [id number]  Take 10 mLs by mouth every 4 (four) hours as needed for cough. Vann, Jessica U, DO  Active   losartan -hydrochlorothiazide  (HYZAAR) 50-12.5 MG tablet [id number]  Take 1 tablet by mouth 2 (two) times daily.  Patient not taking: Reported on 05/03/2024   Elnora Ip, MD  Active Self, Pharmacy Records, Multiple Informants  metoprolol  tartrate (LOPRESSOR ) 50 MG tablet [id number]  Take 1 tablet (50 mg total) by mouth 2 (two) times daily. Vann, Jessica U, DO  Active   predniSONE  (DELTASONE ) 20 MG  tablet [id number]  Take 2 tablets (40 mg total) by mouth daily with breakfast.  Patient not taking: Reported on 05/09/2024   Vann, Jessica U, DO  Active   Med List Note STEFFI, [place], CPhT 04/19/24 1411): History of intermittent compliance             Recommendation:   PCP Follow-up Continue Current Plan of Care Connect with the pharmacist for medication assistance  Follow Up Plan:   Telephone follow-up in 1 week  Medford Moises, BSN, RN [place]  VBCI - [facility] RN Care Manager [phone]

## 2024-05-09 NOTE — Progress Notes (Signed)
 Complex Care Management Care Guide Note  05/09/2024 Name: [name] MRN: [id number] DOB: Aug 10, 1951  [name] is a 73 y.o. year old female who is a primary care patient of Elnora Ip, MD and is actively engaged with the care management team. I reached out to [name] by phone today to assist with re-scheduling  with the Licensed Clinical Child psychotherapist.  Follow up plan: Unsuccessful telephone outreach attempt made. A HIPAA compliant phone message was left for the patient providing contact information and requesting a return call.  Harlene Satterfield  [facility]  [facility], [facility] Guide  Direct Dial: [phone]  Fax [phone]

## 2024-05-12 NOTE — Assessment & Plan Note (Deleted)
-  pt currently smoking ~1ppd of cigarettes -counseled pt on smoking cessation -offer nicotine  patch

## 2024-05-12 NOTE — Progress Notes (Deleted)
 Patient name: [name] Date of birth: [birth year] Date of visit: 05/12/24  Type of visit: Established Patient Office Visit   Subjective   Chief concern: No chief complaint on file.   [name] is a 73 y.o. female with a PMHx of T2DM, COPD, HTN, HLD who presents to [facility] clinic for hospital follow up.  Problems that need to be followed up: -COPD exacerbation -Hypertension  -TUD -Med Rec   Follow up Hospitalization  Patient was admitted to [facility] on 04/18/24 and discharged on 04/22/24. She was treated for COPD exacerbation. Treatment for this included IV steroids which were weaned to PO, azithromycin , and increased O2 level. Telephone follow up was done on 04/23/24. She reports inadequate compliance with treatment due to difficulty accessing medications. She reports this condition is improved.  ----------------------------------------------------------------------------------------- -    Patient Active Problem List   Diagnosis Date Noted   Hypertensive urgency 04/19/2024   COPD exacerbation (HCC) 04/18/2024   Memory changes 03/28/2023   Age-related osteoporosis without current pathological fracture  03/06/2021   Healthcare maintenance 03/06/2021   Chronic hip pain, right 06/22/2019   Diabetic neuropathy (HCC) 05/21/2019   Type 2 diabetes mellitus with complication, without long-term current use of insulin  (HCC) 12/26/2018   Chronic sinusitis    COPD (chronic obstructive pulmonary disease) (HCC) 12/24/2014   Acute hypoxic respiratory failure (HCC)    Dyslipidemia associated with type 2 diabetes mellitus (HCC) 06/14/2014   Breast nodule 12/01/2012   Primary osteoarthritis of right knee 09/29/2012   Anxiety 12/30/2009   Tobacco use disorder 04/06/2009   Hypertension associated with chronic kidney disease due to type 2 diabetes mellitus (HCC) 04/06/2009     Past Surgical History:  Procedure Laterality Date   ABDOMINAL HYSTERECTOMY     ANTERIOR  CERVICAL DECOMP/DISCECTOMY FUSION  2012   Dr. Beuford  put plate in; did something to my vertebrae   BACK SURGERY     BREAST LUMPECTOMY Right 1991   DOBUTAMINE  STRESS ECHO  08/2004   Inferior ischemia, normal LV systolic function, no significant CAD   MINOR IRRIGATION AND DEBRIDEMENT OF WOUND Right 09/04/2022   Procedure: IRRIGATION AND DEBRIDEMENT OF NASAL WOUND;  Surgeon: Luciano Standing, MD;  Location: [facility] OR;  Service: ENT;  Laterality: Right;    ROS  Current Outpatient Medications  Medication Instructions   acetaminophen  (TYLENOL ) 650 mg, Oral, Every 6 hours PRN   albuterol  (PROVENTIL ) 2.5 mg, Nebulization, Every 4 hours PRN   albuterol  (VENTOLIN  HFA) 108 (90 Base) MCG/ACT inhaler 2 puffs, Inhalation, Every 6 hours PRN   amLODipine  (NORVASC ) 10 mg, Oral, Daily   Fluticasone -Umeclidin-Vilant (TRELEGY ELLIPTA ) 100-62.5-25 MCG/ACT AEPB 1 puff, Inhalation, Daily   guaiFENesin  (MUCINEX ) 600 mg, Oral, 2 times daily   guaiFENesin -dextromethorphan  (ROBITUSSIN DM) 100-10 MG/5ML syrup 10 mLs, Oral, Every 4 hours PRN   losartan -hydrochlorothiazide  (HYZAAR) 50-12.5 MG tablet 1 tablet, Oral, 2 times daily   metoprolol  tartrate (LOPRESSOR ) 50 mg, Oral, 2 times daily   predniSONE  (DELTASONE ) 40 mg, Oral, Daily with breakfast    Social History   Tobacco Use   Smoking status: Every Day    Current packs/day: 0.50    Average packs/day: 0.5 packs/day for 45.0 years (22.5 ttl pk-yrs)    Types: Cigarettes   Smokeless tobacco: Never   Tobacco comments:    1/2 pk per day  Vaping Use   Vaping status: Never Used  Substance Use Topics   Alcohol  use: Yes    Alcohol /week: 4.0 standard drinks of  alcohol     Types: 4 Cans of beer per week    Comment: 08/16/2018 weekends only   Drug use: No      Objective  There were no vitals filed for this visit.There is no height or weight on file to calculate BMI.   Physical Exam  {Labs (Optional):23779}    Assessment & Plan  Problem List Items  Addressed This Visit       Cardiovascular and Mediastinum   Hypertension associated with chronic kidney disease due to type 2 diabetes mellitus (HCC)     Respiratory   COPD (chronic obstructive pulmonary disease) (HCC) - Primary     Other   Tobacco use disorder    No follow-ups on file.  Patient discussed with Dr. {imcattendings:[id number]}, who also saw and evaluated the patient.  Avneet Ashmore, MD [facility]  PGY-1 05/12/2024, 9:37 PM

## 2024-05-12 NOTE — Assessment & Plan Note (Deleted)
-  Systolic bp up to 170-200 during recent admission -Home regimen: amlodipine  10mg , losartan -hydrochlorothiazide  50-12.5mg  (Hyzaar), metoprolol  tartrate 25mg  bid

## 2024-05-12 NOTE — Assessment & Plan Note (Deleted)
-  recent exacerbation leading to a hospitalization lasting 6/11-6/15/25 that required elevation in supplemental O2 -completed course of PO steroids, reports non-adherence to COPD regimen of albuterol  inhaler -due to medication access issues, will try to switch patient to Breztri  (budesonide /glycopyrrolate /formoterol )

## 2024-05-13 ENCOUNTER — Other Ambulatory Visit: Payer: Self-pay

## 2024-05-13 ENCOUNTER — Encounter (HOSPITAL_COMMUNITY): Payer: Self-pay | Admitting: Emergency Medicine

## 2024-05-13 ENCOUNTER — Observation Stay (HOSPITAL_COMMUNITY)
Admission: EM | Admit: 2024-05-13 | Discharge: 2024-05-14 | Disposition: A | Discharge status: Home / Self Care | Attending: Internal Medicine | Admitting: Internal Medicine

## 2024-05-13 ENCOUNTER — Emergency Department (HOSPITAL_COMMUNITY)

## 2024-05-13 DIAGNOSIS — E1122 Type 2 diabetes mellitus with diabetic chronic kidney disease: Secondary | ICD-10-CM | POA: Diagnosis not present

## 2024-05-13 DIAGNOSIS — Z7951 Long term (current) use of inhaled steroids: Secondary | ICD-10-CM | POA: Diagnosis not present

## 2024-05-13 DIAGNOSIS — J45909 Unspecified asthma, uncomplicated: Secondary | ICD-10-CM | POA: Diagnosis not present

## 2024-05-13 DIAGNOSIS — J441 Chronic obstructive pulmonary disease with (acute) exacerbation: Secondary | ICD-10-CM | POA: Diagnosis not present

## 2024-05-13 DIAGNOSIS — Z79899 Other long term (current) drug therapy: Secondary | ICD-10-CM | POA: Diagnosis not present

## 2024-05-13 DIAGNOSIS — E1140 Type 2 diabetes mellitus with diabetic neuropathy, unspecified: Secondary | ICD-10-CM | POA: Diagnosis not present

## 2024-05-13 DIAGNOSIS — F17210 Nicotine dependence, cigarettes, uncomplicated: Secondary | ICD-10-CM | POA: Diagnosis not present

## 2024-05-13 DIAGNOSIS — N189 Chronic kidney disease, unspecified: Secondary | ICD-10-CM | POA: Insufficient documentation

## 2024-05-13 DIAGNOSIS — S2241XA Multiple fractures of ribs, right side, initial encounter for closed fracture: Secondary | ICD-10-CM | POA: Diagnosis not present

## 2024-05-13 DIAGNOSIS — I129 Hypertensive chronic kidney disease with stage 1 through stage 4 chronic kidney disease, or unspecified chronic kidney disease: Secondary | ICD-10-CM | POA: Diagnosis not present

## 2024-05-13 DIAGNOSIS — E118 Type 2 diabetes mellitus with unspecified complications: Secondary | ICD-10-CM | POA: Diagnosis present

## 2024-05-13 DIAGNOSIS — F17200 Nicotine dependence, unspecified, uncomplicated: Secondary | ICD-10-CM | POA: Diagnosis present

## 2024-05-13 DIAGNOSIS — Z853 Personal history of malignant neoplasm of breast: Secondary | ICD-10-CM | POA: Insufficient documentation

## 2024-05-13 DIAGNOSIS — J439 Emphysema, unspecified: Secondary | ICD-10-CM | POA: Diagnosis not present

## 2024-05-13 DIAGNOSIS — R0602 Shortness of breath: Secondary | ICD-10-CM | POA: Diagnosis not present

## 2024-05-13 LAB — GLUCOSE, CAPILLARY
Glucose-Capillary: 220 mg/dL — ABNORMAL HIGH (ref 70–99)
Glucose-Capillary: 80 mg/dL (ref 70–99)
Glucose-Capillary: 176 mg/dL — ABNORMAL HIGH (ref 70–99)

## 2024-05-13 LAB — CBC
HCT: 38.6 % (ref 36.0–46.0)
Hemoglobin: 12.9 g/dL (ref 12.0–15.0)
MCH: 30 pg (ref 26.0–34.0)
MCHC: 33.4 g/dL (ref 30.0–36.0)
MCV: 89.8 fL (ref 80.0–100.0)
Platelets: 330 K/uL (ref 150–400)
RBC: 4.3 MIL/uL (ref 3.87–5.11)
RDW: 14.4 % (ref 11.5–15.5)
WBC: 4.2 K/uL (ref 4.0–10.5)
nRBC: 0 % (ref 0.0–0.2)

## 2024-05-13 LAB — COMPREHENSIVE METABOLIC PANEL WITH GFR
ALT: 10 U/L (ref 0–44)
AST: 16 U/L (ref 15–41)
Albumin: 3.9 g/dL (ref 3.5–5.0)
Alkaline Phosphatase: 104 U/L (ref 38–126)
Anion gap: 11 (ref 5–15)
BUN: 8 mg/dL (ref 8–23)
CO2: 26 mmol/L (ref 22–32)
Calcium: 8.8 mg/dL — ABNORMAL LOW (ref 8.9–10.3)
Chloride: 101 mmol/L (ref 98–111)
Creatinine, Ser: 0.62 mg/dL (ref 0.44–1.00)
GFR, Estimated: >60 mL/min (ref >60)
Glucose, Bld: 103 mg/dL — ABNORMAL HIGH (ref 70–99)
Potassium: 3.5 mmol/L (ref 3.5–5.1)
Sodium: 138 mmol/L (ref 135–145)
Total Bilirubin: 1.2 mg/dL (ref 0.0–1.2)
Total Protein: 7.4 g/dL (ref 6.5–8.1)

## 2024-05-13 LAB — BLOOD GAS, VENOUS
Acid-Base Excess: 2.5 mmol/L — ABNORMAL HIGH (ref 0.0–2.0)
Bicarbonate: 29.3 mmol/L — ABNORMAL HIGH (ref 20.0–28.0)
O2 Saturation: 58.9 %
Patient temperature: 37
pCO2, Ven: 53 mmHg (ref 44–60)
pH, Ven: 7.35 (ref 7.25–7.43)
pO2, Ven: 35 mmHg (ref 32–45)

## 2024-05-13 LAB — RESP PANEL BY RT-PCR (RSV, FLU A&B, COVID)  RVPGX2
Influenza A by PCR: NEGATIVE
Influenza B by PCR: NEGATIVE
Resp Syncytial Virus by PCR: NEGATIVE
SARS Coronavirus 2 by RT PCR: NEGATIVE

## 2024-05-13 LAB — CBG MONITORING, ED: Glucose-Capillary: 186 mg/dL — ABNORMAL HIGH (ref 70–99)

## 2024-05-13 LAB — MAGNESIUM: Magnesium: 1.9 mg/dL (ref 1.7–2.4)

## 2024-05-13 MED ORDER — AMLODIPINE BESYLATE 10 MG PO TABS
10.0000 mg | ORAL_TABLET | Freq: Every day | ORAL | Status: DC
  Administered 2024-05-13 – 2024-05-14 (×2): 10 mg via ORAL
  Filled 2024-05-13 (×2): qty 1

## 2024-05-13 MED ORDER — METHYLPREDNISOLONE SODIUM SUCC 40 MG IJ SOLR
40.0000 mg | Freq: Two times a day (BID) | INTRAMUSCULAR | Status: DC
  Administered 2024-05-13 – 2024-05-14 (×2): 40 mg via INTRAVENOUS
  Filled 2024-05-13 (×2): qty 1

## 2024-05-13 MED ORDER — METHOCARBAMOL 500 MG PO TABS
500.0000 mg | ORAL_TABLET | Freq: Four times a day (QID) | ORAL | Status: DC | PRN
  Administered 2024-05-13 – 2024-05-14 (×3): 500 mg via ORAL
  Filled 2024-05-13 (×3): qty 1

## 2024-05-13 MED ORDER — IPRATROPIUM-ALBUTEROL 0.5-2.5 (3) MG/3ML IN SOLN
3.0000 mL | Freq: Four times a day (QID) | RESPIRATORY_TRACT | Status: DC
  Administered 2024-05-13 – 2024-05-14 (×5): 3 mL via RESPIRATORY_TRACT
  Filled 2024-05-13 (×5): qty 3

## 2024-05-13 MED ORDER — ONDANSETRON HCL 4 MG PO TABS: 4.0000 mg | ORAL_TABLET | Freq: Four times a day (QID) | ORAL | Status: DC | PRN

## 2024-05-13 MED ORDER — MAGNESIUM SULFATE 2 GM/50ML IV SOLN
2.0000 g | Freq: Once | INTRAVENOUS | Status: AC
  Administered 2024-05-13: 2 g via INTRAVENOUS
  Filled 2024-05-13: qty 50

## 2024-05-13 MED ORDER — METHYLPREDNISOLONE SODIUM SUCC 125 MG IJ SOLR
125.0000 mg | Freq: Once | INTRAMUSCULAR | Status: AC
Start: 2024-05-13 — End: 2024-05-13
  Administered 2024-05-13: 125 mg via INTRAVENOUS
  Filled 2024-05-13: qty 2

## 2024-05-13 MED ORDER — HYDRALAZINE HCL 20 MG/ML IJ SOLN
5.0000 mg | Freq: Four times a day (QID) | INTRAMUSCULAR | Status: DC | PRN
  Administered 2024-05-13: 5 mg via INTRAVENOUS
  Filled 2024-05-13: qty 1

## 2024-05-13 MED ORDER — LOSARTAN POTASSIUM-HCTZ 50-12.5 MG PO TABS: 1.0000 | ORAL_TABLET | Freq: Two times a day (BID) | ORAL | Status: DC

## 2024-05-13 MED ORDER — INSULIN ASPART 100 UNIT/ML IJ SOLN
0.0000 [IU] | Freq: Three times a day (TID) | INTRAMUSCULAR | Status: DC
  Administered 2024-05-13: 3 [IU] via SUBCUTANEOUS
  Administered 2024-05-13: 5 [IU] via SUBCUTANEOUS
  Administered 2024-05-14 (×2): 2 [IU] via SUBCUTANEOUS
  Filled 2024-05-13: qty 0.15

## 2024-05-13 MED ORDER — ACETAMINOPHEN 325 MG PO TABS
650.0000 mg | ORAL_TABLET | Freq: Four times a day (QID) | ORAL | Status: DC | PRN
  Administered 2024-05-14: 650 mg via ORAL
  Filled 2024-05-13: qty 2

## 2024-05-13 MED ORDER — IPRATROPIUM BROMIDE 0.02 % IN SOLN
1.0000 mg | Freq: Once | RESPIRATORY_TRACT | Status: AC
  Administered 2024-05-13: 1 mg via RESPIRATORY_TRACT
  Filled 2024-05-13: qty 5

## 2024-05-13 MED ORDER — ONDANSETRON HCL 4 MG/2ML IJ SOLN: 4.0000 mg | Freq: Four times a day (QID) | INTRAMUSCULAR | Status: DC | PRN

## 2024-05-13 MED ORDER — GUAIFENESIN ER 600 MG PO TB12
600.0000 mg | ORAL_TABLET | Freq: Two times a day (BID) | ORAL | Status: DC
  Administered 2024-05-13 – 2024-05-14 (×3): 600 mg via ORAL
  Filled 2024-05-13 (×3): qty 1

## 2024-05-13 MED ORDER — LOSARTAN POTASSIUM 50 MG PO TABS
50.0000 mg | ORAL_TABLET | Freq: Every day | ORAL | Status: DC
  Administered 2024-05-13 – 2024-05-14 (×2): 50 mg via ORAL
  Filled 2024-05-13 (×2): qty 1

## 2024-05-13 MED ORDER — INSULIN ASPART 100 UNIT/ML IJ SOLN
0.0000 [IU] | Freq: Every day | INTRAMUSCULAR | Status: DC
  Filled 2024-05-13: qty 0.05

## 2024-05-13 MED ORDER — HYDROCHLOROTHIAZIDE 12.5 MG PO TABS
12.5000 mg | ORAL_TABLET | Freq: Every day | ORAL | Status: DC
  Administered 2024-05-13 – 2024-05-14 (×2): 12.5 mg via ORAL
  Filled 2024-05-13 (×2): qty 1

## 2024-05-13 MED ORDER — TRAZODONE HCL 50 MG PO TABS
25.0000 mg | ORAL_TABLET | Freq: Every evening | ORAL | Status: DC | PRN
Start: 2024-05-13 — End: 2024-05-14
  Administered 2024-05-13: 25 mg via ORAL
  Filled 2024-05-13: qty 1

## 2024-05-13 MED ORDER — ALBUTEROL SULFATE (2.5 MG/3ML) 0.083% IN NEBU
2.5000 mg | INHALATION_SOLUTION | RESPIRATORY_TRACT | Status: DC | PRN
  Administered 2024-05-13: 2.5 mg via RESPIRATORY_TRACT
  Filled 2024-05-13 (×3): qty 3

## 2024-05-13 MED ORDER — IPRATROPIUM BROMIDE 0.02 % IN SOLN: 1.0000 mg | Freq: Once | RESPIRATORY_TRACT | Status: DC

## 2024-05-13 MED ORDER — ALBUTEROL SULFATE (2.5 MG/3ML) 0.083% IN NEBU
10.0000 mg/h | INHALATION_SOLUTION | Freq: Once | RESPIRATORY_TRACT | Status: AC
  Administered 2024-05-13: 10 mg/h via RESPIRATORY_TRACT
  Filled 2024-05-13: qty 12

## 2024-05-13 MED ORDER — METOPROLOL TARTRATE 50 MG PO TABS
50.0000 mg | ORAL_TABLET | Freq: Two times a day (BID) | ORAL | Status: DC
  Administered 2024-05-13 – 2024-05-14 (×3): 50 mg via ORAL
  Filled 2024-05-13 (×3): qty 1

## 2024-05-13 MED ORDER — ACETAMINOPHEN 650 MG RE SUPP: 650.0000 mg | Freq: Four times a day (QID) | RECTAL | Status: DC | PRN

## 2024-05-13 MED ORDER — ENOXAPARIN SODIUM 40 MG/0.4ML IJ SOSY
40.0000 mg | PREFILLED_SYRINGE | INTRAMUSCULAR | Status: DC
Start: 2024-05-13 — End: 2024-05-14
  Administered 2024-05-13: 40 mg via SUBCUTANEOUS
  Filled 2024-05-13: qty 0.4

## 2024-05-13 MED ORDER — ALBUTEROL SULFATE (2.5 MG/3ML) 0.083% IN NEBU: 10.0000 mg/h | INHALATION_SOLUTION | RESPIRATORY_TRACT | Status: DC

## 2024-05-13 NOTE — Plan of Care (Incomplete)

## 2024-05-13 NOTE — Plan of Care (Signed)
 Problem: Education: Goal: Ability to describe self-care measures that may prevent or decrease complications (Diabetes Survival Skills Education) will improve 05/13/2024 1802 by Joesph Morna SQUIBB, RN Outcome: Progressing 05/13/2024 1802 by Joesph Morna SQUIBB, RN Outcome: Progressing Goal: Individualized Educational Video(s) 05/13/2024 1802 by Joesph Morna SQUIBB, RN Outcome: Progressing 05/13/2024 1802 by Joesph Morna SQUIBB, RN Outcome: Progressing   Problem: Coping: Goal: Ability to adjust to condition or change in health will improve 05/13/2024 1802 by Joesph Morna SQUIBB, RN Outcome: Progressing 05/13/2024 1802 by Joesph Morna SQUIBB, RN Outcome: Progressing   Problem: Fluid Volume: Goal: Ability to maintain a balanced intake and output will improve 05/13/2024 1802 by Joesph Morna SQUIBB, RN Outcome: Progressing 05/13/2024 1802 by Joesph Morna SQUIBB, RN Outcome: Progressing   Problem: Health Behavior/Discharge Planning: Goal: Ability to identify and utilize available resources and services will improve 05/13/2024 1802 by Joesph Morna SQUIBB, RN Outcome: Progressing 05/13/2024 1802 by Joesph Morna SQUIBB, RN Outcome: Progressing Goal: Ability to manage health-related needs will improve 05/13/2024 1802 by Joesph Morna SQUIBB, RN Outcome: Progressing 05/13/2024 1802 by Joesph Morna SQUIBB, RN Outcome: Progressing   Problem: Metabolic: Goal: Ability to maintain appropriate glucose levels will improve 05/13/2024 1802 by Joesph Morna SQUIBB, RN Outcome: Progressing 05/13/2024 1802 by Joesph Morna SQUIBB, RN Outcome: Progressing   Problem: Nutritional: Goal: Maintenance of adequate nutrition will improve 05/13/2024 1802 by Joesph Morna SQUIBB, RN Outcome: Progressing 05/13/2024 1802 by Joesph Morna SQUIBB, RN Outcome: Progressing Goal: Progress toward achieving an optimal weight will improve 05/13/2024 1802 by Joesph Morna SQUIBB, RN Outcome: Progressing 05/13/2024 1802 by Joesph Morna SQUIBB, RN Outcome: Progressing   Problem:  Skin Integrity: Goal: Risk for impaired skin integrity will decrease 05/13/2024 1802 by Joesph Morna SQUIBB, RN Outcome: Progressing 05/13/2024 1802 by Joesph Morna SQUIBB, RN Outcome: Progressing   Problem: Tissue Perfusion: Goal: Adequacy of tissue perfusion will improve 05/13/2024 1802 by Joesph Morna SQUIBB, RN Outcome: Progressing 05/13/2024 1802 by Joesph Morna SQUIBB, RN Outcome: Progressing   Problem: Education: Goal: Knowledge of General Education information will improve Description: Including pain rating scale, medication(s)/side effects and non-pharmacologic comfort measures 05/13/2024 1802 by Joesph Morna SQUIBB, RN Outcome: Progressing 05/13/2024 1802 by Joesph Morna SQUIBB, RN Outcome: Progressing   Problem: Health Behavior/Discharge Planning: Goal: Ability to manage health-related needs will improve 05/13/2024 1802 by Joesph Morna SQUIBB, RN Outcome: Progressing 05/13/2024 1802 by Joesph Morna SQUIBB, RN Outcome: Progressing   Problem: Clinical Measurements: Goal: Ability to maintain clinical measurements within normal limits will improve 05/13/2024 1802 by Joesph Morna SQUIBB, RN Outcome: Progressing 05/13/2024 1802 by Joesph Morna SQUIBB, RN Outcome: Progressing Goal: Will remain free from infection 05/13/2024 1802 by Joesph Morna SQUIBB, RN Outcome: Progressing 05/13/2024 1802 by Joesph Morna SQUIBB, RN Outcome: Progressing Goal: Diagnostic test results will improve 05/13/2024 1802 by Joesph Morna SQUIBB, RN Outcome: Progressing 05/13/2024 1802 by Joesph Morna SQUIBB, RN Outcome: Progressing Goal: Respiratory complications will improve 05/13/2024 1802 by Joesph Morna SQUIBB, RN Outcome: Progressing 05/13/2024 1802 by Joesph Morna SQUIBB, RN Outcome: Progressing Goal: Cardiovascular complication will be avoided 05/13/2024 1802 by Joesph Morna SQUIBB, RN Outcome: Progressing 05/13/2024 1802 by Joesph Morna SQUIBB, RN Outcome: Progressing   Problem: Activity: Goal: Risk for activity intolerance will  decrease 05/13/2024 1802 by Joesph Morna SQUIBB, RN Outcome: Progressing 05/13/2024 1802 by Joesph Morna SQUIBB, RN Outcome: Progressing   Problem: Nutrition: Goal: Adequate nutrition will be maintained 05/13/2024 1802 by Joesph Morna SQUIBB, RN Outcome: Progressing 05/13/2024 1802 by Joesph Morna SQUIBB, RN Outcome: Progressing  Problem: Coping: Goal: Level of anxiety will decrease 05/13/2024 1802 by Joesph Morna SQUIBB, RN Outcome: Progressing 05/13/2024 1802 by Joesph Morna SQUIBB, RN Outcome: Progressing   Problem: Elimination: Goal: Will not experience complications related to bowel motility 05/13/2024 1802 by Joesph Morna SQUIBB, RN Outcome: Progressing 05/13/2024 1802 by Joesph Morna SQUIBB, RN Outcome: Progressing Goal: Will not experience complications related to urinary retention 05/13/2024 1802 by Joesph Morna SQUIBB, RN Outcome: Progressing 05/13/2024 1802 by Joesph Morna SQUIBB, RN Outcome: Progressing   Problem: Pain Managment: Goal: General experience of comfort will improve and/or be controlled 05/13/2024 1802 by Joesph Morna SQUIBB, RN Outcome: Progressing 05/13/2024 1802 by Joesph Morna SQUIBB, RN Outcome: Progressing   Problem: Safety: Goal: Ability to remain free from injury will improve 05/13/2024 1802 by Joesph Morna SQUIBB, RN Outcome: Progressing 05/13/2024 1802 by Joesph Morna SQUIBB, RN Outcome: Progressing   Problem: Skin Integrity: Goal: Risk for impaired skin integrity will decrease 05/13/2024 1802 by Joesph Morna SQUIBB, RN Outcome: Progressing 05/13/2024 1802 by Joesph Morna SQUIBB, RN Outcome: Progressing

## 2024-05-13 NOTE — ED Notes (Signed)
EDP and RT at bedside. 

## 2024-05-13 NOTE — TOC Initial Note (Signed)
 Transition of Care [facility]) - Initial/Assessment Note    Patient Details  Name: [name] MRN: [id number] Date of Birth: 04-08-1951  Transition of Care [facility]) CM/SW Contact:    Sonda Manuella Quill, RN Phone Number: 05/13/2024, 12:45 PM  Clinical Narrative:                 [name]; pt says she lives at home w/ her husband Deneisha Dade [phone]); she plans to return at d/c; her husband will provide transportation; pt verified insurance/PCP; she denied SDOH risks; pt says she does not have DME, HH services, or home oxygen ; TOC is following.  Expected Discharge Plan: Home/Self Care Barriers to Discharge: Continued Medical Work up   Patient Goals and CMS Choice Patient states their goals for this hospitalization and ongoing recovery are:: home CMS Medicare.gov Compare Post Acute Care list provided to:: Patient   [place] ownership interest in [facility].provided to:: Patient    Expected Discharge Plan and Services   Discharge Planning Services: CM Consult   Living arrangements for the past 2 months: Single Family Home                                      Prior Living Arrangements/Services Living arrangements for the past 2 months: Single Family Home Lives with:: Friends Patient language and need for interpreter reviewed:: Yes Do you feel safe going back to the place where you live?: Yes      Need for Family Participation in Patient Care: Yes (Comment) Care giver support system in place?: Yes (comment) Current home services:  (n/a) Criminal Activity/Legal Involvement Pertinent to Current Situation/Hospitalization: No - Comment as needed  Activities of Daily Living   ADL Screening (condition at time of admission) Independently performs ADLs?: Yes (appropriate for developmental age) Is the patient deaf or have difficulty hearing?: No Does the patient have difficulty seeing, even when wearing glasses/contacts?: No Does the patient  have difficulty concentrating, remembering, or making decisions?: No  Permission Sought/Granted Permission sought to share information with : Case Manager Permission granted to share information with : Yes, Verbal Permission Granted  Share Information with NAME: Case Manager     Permission granted to share info w Relationship: Ludie Hudon (spouse) [phone]     Emotional Assessment Appearance:: Appears stated age Attitude/Demeanor/Rapport: Gracious Affect (typically observed): Accepting Orientation: : Oriented to Self, Oriented to Place, Oriented to  Time, Oriented to Situation Alcohol  / Substance Use: Not Applicable Psych Involvement: No (comment)  Admission diagnosis:  COPD exacerbation (HCC) [J44.1] Patient Active Problem List   Diagnosis Date Noted   Hypertensive urgency 04/19/2024   COPD exacerbation (HCC) 04/18/2024   Memory changes 03/28/2023   Age-related osteoporosis without current pathological fracture  03/06/2021   Healthcare maintenance 03/06/2021   Chronic hip pain, right 06/22/2019   Diabetic neuropathy (HCC) 05/21/2019   Type 2 diabetes mellitus with complication, without long-term current use of insulin  (HCC) 12/26/2018   Chronic sinusitis    COPD (chronic obstructive pulmonary disease) (HCC) 12/24/2014   Acute hypoxic respiratory failure (HCC)    Dyslipidemia associated with type 2 diabetes mellitus (HCC) 06/14/2014   Breast nodule 12/01/2012   Primary osteoarthritis of right knee 09/29/2012   Anxiety 12/30/2009   Tobacco use disorder 04/06/2009   Hypertension associated with chronic kidney disease due to type 2 diabetes mellitus (HCC) 04/06/2009   PCP:  Elnora Ip,  MD Pharmacy:   [facility] [address] [place] - [address] AT [address] [place] [address] Phone: [phone] Fax: [phone]  [facility] [address], [place] - [address] [place]  [address] Phone: [phone] Fax: [phone]   - [facility] [address] [place] [address] Phone: [phone] Fax: [phone]     Social Drivers of Health (SDOH) Social History: SDOH Screenings   Food Insecurity: No Food Insecurity (05/13/2024)  Housing: Low Risk  (05/13/2024)  Transportation Needs: No Transportation Needs (05/13/2024)  Utilities: Not At Risk (05/13/2024)  Alcohol  Screen: Low Risk  (12/13/2022)  Depression (PHQ2-9): Low Risk  (03/19/2024)  Recent Concern: Depression (PHQ2-9) - Medium Risk (01/03/2024)  Financial Resource Strain: Low Risk  (12/13/2022)  Physical Activity: Inactive (01/03/2024)  Social Connections: Moderately Integrated (05/13/2024)  Stress: Stress Concern Present (01/03/2024)  Tobacco Use: High Risk (05/13/2024)   SDOH Interventions: Food Insecurity Interventions: Intervention Not Indicated, Inpatient TOC Housing Interventions: Intervention Not Indicated, Inpatient TOC Transportation Interventions: Intervention Not Indicated, Inpatient TOC Utilities Interventions: Intervention Not Indicated, Inpatient TOC   Readmission Risk Interventions    05/13/2024   12:43 PM 08/20/2022   11:07 AM  Readmission Risk Prevention Plan  Post Dischage Appt  Complete  Medication Screening  Complete  Transportation Screening Complete Complete  PCP or Specialist Appt within 5-7 Days Complete   Home Care Screening Complete   Medication Review (RN CM) Complete

## 2024-05-13 NOTE — ED Provider Notes (Signed)
 [place] EMERGENCY DEPARTMENT AT [facility] Provider Note  CSN: [id number] Arrival date & time: 05/13/24 9479  Chief Complaint(s) Shortness of Breath  HPI [name] is a 73 y.o. female with a past medical history listed below including asthma/COPD who continues to smoke here for 2 days of gradually worsening shortness of breath not relieved with home inhalers.  No recent fevers or infections.  No cough or congestion.  No chest pain.  No peripheral edema.  The history is provided by the patient.    Past Medical History Past Medical History:  Diagnosis Date   Acute sinusitis 01/09/2019   Acute sinusitis 01/09/2019   Anxiety    Arthritis    fingers, knees (08/16/2018)   Asthma    Atopic dermatitis 03/20/2018   Axillary hidradenitis suppurativa 10/13/2018   Cancer of right breast (HCC) 1991   s/p lumpectomy, chemotherapy and radiation therapy in 1991. Mammogram in 2007 was normal.   Cellulitis of buttock, left 12/13/2022   Constipated    h/o   COPD (chronic obstructive pulmonary disease) (HCC)    History of multiple hospital admissions for exercabation    COPD exacerbation (HCC) 01/01/2019   COPD exacerbation (HCC) 02/17/2024   COPD with acute exacerbation (HCC) 01/14/2019   COPD with exacerbation (HCC) 04/06/2009   Qualifier: Diagnosis of  By: Loletta MD, Vijay     Depression    Diarrhea    h/o   Facial cellulitis 08/30/2022   Facial edema 08/31/2022   GERD (gastroesophageal reflux disease)    Grade I diastolic dysfunction 01/12/2024   Headache    a few times/month (08/16/2018   Heart murmur 10/05/2011   first time I ever heard I had one was today   History of breast cancer 12/01/2012   Pt with h/o breast CA s/p lumpectomy with chemo/radiation in 1991. Pt mammogram 2011 was unremarkable. Had CT chest 10/2012 in ED for SOB and showed spiculated nodule with lymph node. 12/04/12: Birads 2; repeat diagnostic mammogram in 1 year.     Hyperlipidemia     Hypertension    Lower extremity edema 09/21/2018   Obesity    Personal history of chemotherapy    Personal history of radiation therapy    Pneumonia    couple times in the last 10-15 yrs (08/16/2018)   QT prolongation 08/08/2014   Seasonal allergies 02/25/2017   Shortness of breath 10/05/2011   at rest; lying down; w/exertion   Sigmoid diverticulitis 80/2008   Tobacco abuse    Type 2 diabetes mellitus (HCC) 05/14/2009   Type II diabetes mellitus (HCC)    Patient Active Problem List   Diagnosis Date Noted   Hypertensive urgency 04/19/2024   COPD exacerbation (HCC) 04/18/2024   Memory changes 03/28/2023   Age-related osteoporosis without current pathological fracture  03/06/2021   Healthcare maintenance 03/06/2021   Chronic hip pain, right 06/22/2019   Diabetic neuropathy (HCC) 05/21/2019   Type 2 diabetes mellitus with complication, without long-term current use of insulin  (HCC) 12/26/2018   Chronic sinusitis    COPD (chronic obstructive pulmonary disease) (HCC) 12/24/2014   Acute hypoxic respiratory failure (HCC)    Dyslipidemia associated with type 2 diabetes mellitus (HCC) 06/14/2014   Breast nodule 12/01/2012   Primary osteoarthritis of right knee 09/29/2012   Anxiety 12/30/2009   Tobacco use disorder 04/06/2009   Hypertension associated with chronic kidney disease due to type 2 diabetes mellitus (HCC) 04/06/2009   Home Medication(s) Prior to Admission medications   Medication Sig  Start Date End Date Taking? Authorizing Provider  acetaminophen  (TYLENOL ) 325 MG tablet Take 2 tablets (650 mg total) by mouth every 6 (six) hours as needed for mild pain (or Fever >/= 101). 08/23/22   Franchot Novel, MD  albuterol  (PROVENTIL ) (2.5 MG/3ML) 0.083% nebulizer solution Take 3 mLs (2.5 mg total) by nebulization every 4 (four) hours as needed for wheezing or shortness of breath. 04/21/24   Elnora Ip, MD  albuterol  (VENTOLIN  HFA) 108 [phone] Base) MCG/ACT inhaler Inhale  2 puffs into the lungs every 6 (six) hours as needed for wheezing or shortness of breath. 01/10/24   Jolaine Pac, DO  amLODipine  (NORVASC ) 10 MG tablet Take 1 tablet (10 mg total) by mouth daily. Patient not taking: Reported on 05/03/2024 03/19/24 03/19/25  Fernand Prost, MD  Fluticasone -Umeclidin-Vilant (TRELEGY ELLIPTA ) 100-62.5-25 MCG/ACT AEPB Inhale 1 puff into the lungs daily. Patient not taking: Reported on 05/03/2024 03/19/24 05/18/24  Fernand Prost, MD  guaiFENesin  (MUCINEX ) 600 MG 12 hr tablet Take 1 tablet (600 mg total) by mouth 2 (two) times daily. Patient not taking: Reported on 05/03/2024 04/22/24   Vann, Jessica U, DO  guaiFENesin -dextromethorphan  (ROBITUSSIN DM) 100-10 MG/5ML syrup Take 10 mLs by mouth every 4 (four) hours as needed for cough. 04/22/24   Vann, Jessica U, DO  losartan -hydrochlorothiazide  (HYZAAR) 50-12.5 MG tablet Take 1 tablet by mouth 2 (two) times daily. Patient not taking: Reported on 05/03/2024 03/20/24   Elnora Ip, MD  metoprolol  tartrate (LOPRESSOR ) 50 MG tablet Take 1 tablet (50 mg total) by mouth 2 (two) times daily. 04/22/24   Vann, Jessica U, DO  predniSONE  (DELTASONE ) 20 MG tablet Take 2 tablets (40 mg total) by mouth daily with breakfast. Patient not taking: Reported on 05/09/2024 04/23/24   Vann, Jessica U, DO                                                                                                                                    Allergies Ace inhibitors  Review of Systems Review of Systems As noted in HPI  Physical Exam Vital Signs  I have reviewed the triage vital signs BP (!) 138/113 (BP Location: Left Arm)   Pulse (!) 114   Temp 98.2 F (36.8 C) (Oral)   Resp (!) 24   Ht 5' 6 (1.676 m)   Wt 71.2 kg   SpO2 96%   BMI 25.34 kg/m   Physical Exam Vitals reviewed.  Constitutional:      General: She is not in acute distress.    Appearance: She is well-developed. She is not diaphoretic.  HENT:     Head: Normocephalic and  atraumatic.     Nose: Nose normal.  Eyes:     General: No scleral icterus.       Right eye: No discharge.        Left eye: No discharge.     Conjunctiva/sclera: Conjunctivae normal.     Pupils:  Pupils are equal, round, and reactive to light.  Cardiovascular:     Rate and Rhythm: Normal rate and regular rhythm.     Heart sounds: No murmur heard.    No friction rub. No gallop.  Pulmonary:     Effort: Tachypnea, accessory muscle usage, prolonged expiration and respiratory distress present.     Breath sounds: Decreased air movement (throughout) present. No stridor. Wheezing (faint) present. No rales.  Abdominal:     General: There is no distension.     Palpations: Abdomen is soft.     Tenderness: There is no abdominal tenderness.  Musculoskeletal:        General: No tenderness.     Cervical back: Normal range of motion and neck supple.  Skin:    General: Skin is warm and dry.     Findings: No erythema or rash.  Neurological:     Mental Status: She is alert and oriented to person, place, and time.     ED Results and Treatments Labs (all labs ordered are listed, but only abnormal results are displayed) Labs Reviewed  COMPREHENSIVE METABOLIC PANEL WITH GFR - Abnormal; Notable for the following components:      Result Value   Glucose, Bld 103 (*)    Calcium  8.8 (*)    All other components within normal limits  RESP PANEL BY RT-PCR (RSV, FLU A&B, COVID)  [id number]  CBC  BLOOD GAS, VENOUS                                                                                                                         EKG  EKG Interpretation Date/Time:    Ventricular Rate:    PR Interval:    QRS Duration:    QT Interval:    QTC Calculation:   R Axis:      Text Interpretation:         Radiology DG Chest Port 1 View Result Date: 05/13/2024 CLINICAL DATA:  Shortness of breath. EXAM: PORTABLE CHEST 1 VIEW COMPARISON:  04/18/2024 FINDINGS: Surgical clips noted in the right axilla. Heart  size and mediastinal contours are normal. Lungs are hyperinflated with coarsened interstitial markings of COPD/emphysema. No pleural fluid, interstitial edema or airspace disease. No pneumothorax. Postoperative changes noted within the lower cervical spine. Remote healed right rib fractures. IMPRESSION: 1. No acute findings. 2. COPD/emphysema. Electronically Signed   By: Waddell Calk M.D.   On: 05/13/2024 06:00    Medications Ordered in ED Medications  methylPREDNISolone  sodium succinate (SOLU-MEDROL ) 125 mg/2 mL injection 125 mg (125 mg Intravenous Given 05/13/24 0540)  albuterol  (PROVENTIL ) (2.5 MG/3ML) 0.083% nebulizer solution (10 mg/hr Nebulization Given 05/13/24 0543)  ipratropium (ATROVENT ) nebulizer solution 1 mg (1 mg Nebulization Given 05/13/24 0543)  magnesium  sulfate IVPB 2 g 50 mL (0 g Intravenous Stopped 05/13/24 0627)   Procedures .Critical Care  Performed by: Trine Raynell Moder, MD Authorized by: Trine Raynell Moder, MD   Critical care provider statement:  Critical care time (minutes):  45   Critical care time was exclusive of:  Separately billable procedures and treating other patients   Critical care was necessary to treat or prevent imminent or life-threatening deterioration of the following conditions:  Respiratory failure   Critical care was time spent personally by me on the following activities:  Development of treatment plan with patient or surrogate, discussions with consultants, evaluation of patient's response to treatment, examination of patient, obtaining history from patient or surrogate, review of old charts, re-evaluation of patient's condition, pulse oximetry, ordering and review of radiographic studies, ordering and review of laboratory studies and ordering and performing treatments and interventions   (including critical care time) Medical Decision Making / ED Course   Medical Decision Making Amount and/or Complexity of Data Reviewed Labs: ordered.  Decision-making details documented in ED Course. Radiology: ordered and independent interpretation performed. Decision-making details documented in ED Course. ECG/medicine tests: ordered and independent interpretation performed. Decision-making details documented in ED Course.  Risk Prescription drug management. Decision regarding hospitalization.    Respiratory distress differential diagnosis considered  Presentation is most suspicious for asthma/COPD exacerbation.  Patient has increased work of breathing with diminished lung sounds throughout.  Still satting 97% on room air.  No evidence of volume overload on exam.  Chest x-ray without pneumonia, pneumothorax, pulmonary edema pleural effusions.  CBC without leukocytosis or anemia.  Metabolic panel without significant electrolyte derangements or renal sufficiency.  EKG without dysrhythmias or blocks.  Patient placed on continuous DuoNeb, given Solu-Medrol  and magnesium   On reassessment an hour and a half later, patient completed her.  She does have improved air movement with more audible and diffuse wheezes.  Still has increased work of breathing.  Will require admission for further management.    Final Clinical Impression(s) / ED Diagnoses Final diagnoses:  COPD exacerbation (HCC)    This chart was dictated using voice recognition software.  Despite best efforts to proofread,  errors can occur which can change the documentation meaning.    Trine Raynell Moder, MD 05/13/24 [phone]

## 2024-05-13 NOTE — ED Triage Notes (Signed)
 Pt presents from home with SOB/COPD/asthma since yesterday; audible wheezing, pt reports she used inhaler and neb treatments PTA

## 2024-05-13 NOTE — H&P (Signed)
 History and Physical  [name] FMW:[id number] DOB: 21-May-1951 DOA: 05/13/2024  PCP: Elnora Ip, MD   Chief Complaint: Wheezing, shortness of breath  HPI: [name] is a 73 y.o. female with medical history significant for hypertension, diabetes, COPD on room air and medication noncompliance being admitted to the hospital with 2 days of cough, congestion, shortness of breath, wheezing and dyspnea with exertion due to acute exacerbation of COPD.  She was just admitted with similar presentation to [facility] hospital and discharged on 04/22/2024 after treatment of COPD exacerbation and adjustment of her blood pressure medications.  During that hospitalization, she stated that she had not been taking her blood pressure medications.  Tells me now that for the last few days she has been taking her medications, but has only been taking 1 blood pressure medication.  In any case, she states that she did pretty well after she discharged from the hospital and took her steroids as prescribed.  Starting about 3 or 4 days ago, she started getting cold symptoms, and in the last 24 hours developed worsening cough, shortness of breath, dyspnea, and wheezing.  When asked about taking her Trelegy Ellipta , she states that she takes albuterol  inhaler on a daily basis.  She denies any chest pain, fevers or increased bleeding production.  Review of Systems: Please see HPI for pertinent positives and negatives. A complete 10 system review of systems are otherwise negative.  Past Medical History:  Diagnosis Date   Acute sinusitis 01/09/2019   Acute sinusitis 01/09/2019   Anxiety    Arthritis    fingers, knees (08/16/2018)   Asthma    Atopic dermatitis 03/20/2018   Axillary hidradenitis suppurativa 10/13/2018   Cancer of right breast (HCC) 1991   s/p lumpectomy, chemotherapy and radiation therapy in 1991. Mammogram in 2007 was normal.   Cellulitis of buttock, left 12/13/2022   Constipated    h/o    COPD (chronic obstructive pulmonary disease) (HCC)    History of multiple hospital admissions for exercabation    COPD exacerbation (HCC) 01/01/2019   COPD exacerbation (HCC) 02/17/2024   COPD with acute exacerbation (HCC) 01/14/2019   COPD with exacerbation (HCC) 04/06/2009   Qualifier: Diagnosis of  By: Loletta MD, Vijay     Depression    Diarrhea    h/o   Facial cellulitis 08/30/2022   Facial edema 08/31/2022   GERD (gastroesophageal reflux disease)    Grade I diastolic dysfunction 01/12/2024   Headache    a few times/month (08/16/2018   Heart murmur 10/05/2011   first time I ever heard I had one was today   History of breast cancer 12/01/2012   Pt with h/o breast CA s/p lumpectomy with chemo/radiation in 1991. Pt mammogram 2011 was unremarkable. Had CT chest 10/2012 in ED for SOB and showed spiculated nodule with lymph node. 12/04/12: Birads 2; repeat diagnostic mammogram in 1 year.     Hyperlipidemia    Hypertension    Lower extremity edema 09/21/2018   Obesity    Personal history of chemotherapy    Personal history of radiation therapy    Pneumonia    couple times in the last 10-15 yrs (08/16/2018)   QT prolongation 08/08/2014   Seasonal allergies 02/25/2017   Shortness of breath 10/05/2011   at rest; lying down; w/exertion   Sigmoid diverticulitis 80/2008   Tobacco abuse    Type 2 diabetes mellitus (HCC) 05/14/2009   Type II diabetes mellitus (HCC)    Past Surgical History:  Procedure Laterality Date   ABDOMINAL HYSTERECTOMY     ANTERIOR CERVICAL DECOMP/DISCECTOMY FUSION  2012   Dr. Beuford  put plate in; did something to my vertebrae   BACK SURGERY     BREAST LUMPECTOMY Right 1991   DOBUTAMINE  STRESS ECHO  08/2004   Inferior ischemia, normal LV systolic function, no significant CAD   MINOR IRRIGATION AND DEBRIDEMENT OF WOUND Right 09/04/2022   Procedure: IRRIGATION AND DEBRIDEMENT OF NASAL WOUND;  Surgeon: Luciano Standing, MD;  Location: [facility] OR;   Service: ENT;  Laterality: Right;   Social History:  reports that she has been smoking cigarettes. She has a 22.5 pack-year smoking history. She has never used smokeless tobacco. She reports current alcohol  use of about 4.0 standard drinks of alcohol  per week. She reports that she does not use drugs.  Allergies  Allergen Reactions   Ace Inhibitors Anaphylaxis, Swelling and Other (See Comments)    Throat swelling  (Re: Lisinopril -hydrochlorothiazide )  Pt questioned if she is allergic in 2025.    Family History  Problem Relation Age of Onset   Cancer Mother        unknown   Breast cancer Daughter 2   Breast cancer Daughter        7s   BRCA 1/2 Neg Hx      Prior to Admission medications   Medication Sig Start Date End Date Taking? Authorizing Provider  acetaminophen  (TYLENOL ) 325 MG tablet Take 2 tablets (650 mg total) by mouth every 6 (six) hours as needed for mild pain (or Fever >/= 101). 08/23/22  Yes Franchot Novel, MD  albuterol  (PROVENTIL ) (2.5 MG/3ML) 0.083% nebulizer solution Take 3 mLs (2.5 mg total) by nebulization every 4 (four) hours as needed for wheezing or shortness of breath. 04/21/24  Yes Elnora Ip, MD  albuterol  (VENTOLIN  HFA) 108 [phone] Base) MCG/ACT inhaler Inhale 2 puffs into the lungs every 6 (six) hours as needed for wheezing or shortness of breath. 01/10/24  Yes Jolaine Pac, DO  amLODipine  (NORVASC ) 10 MG tablet Take 1 tablet (10 mg total) by mouth daily. 03/19/24 03/19/25 Yes Fernand Prost, MD  guaiFENesin  (MUCINEX ) 600 MG 12 hr tablet Take 1 tablet (600 mg total) by mouth 2 (two) times daily. 04/22/24  Yes Vann, Jessica U, DO  guaiFENesin -dextromethorphan  (ROBITUSSIN DM) 100-10 MG/5ML syrup Take 10 mLs by mouth every 4 (four) hours as needed for cough. 04/22/24  Yes Vann, Jessica U, DO  losartan -hydrochlorothiazide  (HYZAAR) 50-12.5 MG tablet Take 1 tablet by mouth 2 (two) times daily. 03/20/24  Yes Elnora Ip, MD  metoprolol  tartrate  (LOPRESSOR ) 50 MG tablet Take 1 tablet (50 mg total) by mouth 2 (two) times daily. 04/22/24  Yes Vann, Jessica U, DO  predniSONE  (DELTASONE ) 20 MG tablet Take 2 tablets (40 mg total) by mouth daily with breakfast. 04/23/24  Yes Vann, Jessica U, DO  Fluticasone -Umeclidin-Vilant (TRELEGY ELLIPTA ) 100-62.5-25 MCG/ACT AEPB Inhale 1 puff into the lungs daily. Patient not taking: Reported on 05/03/2024 03/19/24 05/18/24  Fernand Prost, MD    Physical Exam: BP (!) 188/73 (BP Location: Right Arm)   Pulse (!) 109   Temp 98.6 F (37 C) (Axillary)   Resp (!) 22   Ht 5' 6 (1.676 m)   Wt 71.2 kg   SpO2 95%   BMI 25.34 kg/m  General:  Alert, oriented, resting on room air, tachypneic at rest and worsens with even 2-3 word spoken.  No cough.  No distress other than mild tachypnea. Cardiovascular: RRR, no murmurs or  rubs, no peripheral edema  Respiratory: Equal bilateral air entry with tachypnea, and diffuse rhonchi and expiratory wheezing Abdomen: soft, nontender, nondistended, normal bowel tones heard  Skin: dry, no rashes  Musculoskeletal: no joint effusions, normal range of motion  Psychiatric: appropriate affect, normal speech  Neurologic: extraocular muscles intact, clear speech, moving all extremities with intact sensorium         Labs on Admission:  Basic Metabolic Panel: Recent Labs  Lab 05/13/24 0539  NA 138  K 3.5  CL 101  CO2 26  GLUCOSE 103*  BUN 8  CREATININE 0.62  CALCIUM  8.8*   Liver Function Tests: Recent Labs  Lab 05/13/24 0539  AST 16  ALT 10  ALKPHOS 104  BILITOT 1.2  PROT 7.4  ALBUMIN 3.9   No results for input(s): LIPASE, AMYLASE in the last 168 hours. No results for input(s): AMMONIA in the last 168 hours. CBC: Recent Labs  Lab 05/13/24 0539  WBC 4.2  HGB 12.9  HCT 38.6  MCV 89.8  PLT 330   Cardiac Enzymes: No results for input(s): CKTOTAL, CKMB, CKMBINDEX, TROPONINI in the last 168 hours. BNP (last 3 results) Recent Labs     01/12/24 0830 02/17/24 1633 04/18/24 1809  BNP 68.9 59.5 108.1*    ProBNP (last 3 results) No results for input(s): PROBNP in the last 8760 hours.  CBG: Recent Labs  Lab 05/13/24 0800  GLUCAP 186*    Radiological Exams on Admission: DG Chest Port 1 View Result Date: 05/13/2024 CLINICAL DATA:  Shortness of breath. EXAM: PORTABLE CHEST 1 VIEW COMPARISON:  04/18/2024 FINDINGS: Surgical clips noted in the right axilla. Heart size and mediastinal contours are normal. Lungs are hyperinflated with coarsened interstitial markings of COPD/emphysema. No pleural fluid, interstitial edema or airspace disease. No pneumothorax. Postoperative changes noted within the lower cervical spine. Remote healed right rib fractures. IMPRESSION: 1. No acute findings. 2. COPD/emphysema. Electronically Signed   By: Waddell Calk M.D.   On: 05/13/2024 06:00   Assessment/Plan [name] is a 73 y.o. female with medical history significant for hypertension, diabetes, COPD on room air and medication noncompliance being admitted to the hospital with 2 days of cough, congestion, shortness of breath, wheezing and dyspnea with exertion due to acute exacerbation of COPD.   Acute exacerbation of COPD-with increased cough, wheezing, dyspnea with exertion.  Diffuse rhonchi and tachypnea continues despite aggressive treatment in the emergency department.  She is stable on room air.  No evidence of acute bacterial infection.  Negative for COVID, RSV and flu. -Inpatient admission -Continue IV Solu-Medrol  -Currently stable on room air but would provide oxygen  as needed to keep O2 saturation greater than 89% -Incentive spirometer and flutter valve -Scheduled DuoNebs, with as needed albuterol   Non-insulin -dependent type 2 diabetes-anticipate some hyperglycemia due to IV steroids, last hemoglobin A1c 5.4 in 03/2024. -Carb modified diet -Moderate dose sliding scale  Uncontrolled hypertension-due to medication  noncompliance.  During her last admission, she was discharged on amlodipine  10 mg p.o. daily, and Hyzaar 1 tab p.o. twice daily as well as Lopressor  50 mg p.o. twice daily. -Amlodipine  10 mg p.o. daily -Hyzaar 50 one tab p.o. twice daily -Lopressor  50 mg p.o. twice daily  DVT prophylaxis: Lovenox      Code Status: Full Code  Consults called: None  Admission status: The appropriate patient status for this patient is INPATIENT. Inpatient status is judged to be reasonable and necessary in order to provide the required intensity of service to ensure the patient's  safety. The patient's presenting symptoms, physical exam findings, and initial radiographic and laboratory data in the context of their chronic comorbidities is felt to place them at high risk for further clinical deterioration. Furthermore, it is not anticipated that the patient will be medically stable for discharge from the hospital within 2 midnights of admission.    I certify that at the point of admission it is my clinical judgment that the patient will require inpatient hospital care spanning beyond 2 midnights from the point of admission due to high intensity of service, high risk for further deterioration and high frequency of surveillance required  Time spent: 56 minutes  Christie Agustin CHRISTELLA Gail MD [facility] Pager [phone]  If 7PM-7AM, please contact night-coverage [email or web address] Password [facility]  05/13/2024, 9:48 AM

## 2024-05-14 ENCOUNTER — Other Ambulatory Visit (HOSPITAL_COMMUNITY): Payer: Self-pay

## 2024-05-14 ENCOUNTER — Encounter

## 2024-05-14 DIAGNOSIS — E1122 Type 2 diabetes mellitus with diabetic chronic kidney disease: Secondary | ICD-10-CM

## 2024-05-14 DIAGNOSIS — J441 Chronic obstructive pulmonary disease with (acute) exacerbation: Secondary | ICD-10-CM | POA: Diagnosis present

## 2024-05-14 DIAGNOSIS — J449 Chronic obstructive pulmonary disease, unspecified: Secondary | ICD-10-CM

## 2024-05-14 DIAGNOSIS — F17200 Nicotine dependence, unspecified, uncomplicated: Secondary | ICD-10-CM

## 2024-05-14 LAB — GLUCOSE, CAPILLARY
Glucose-Capillary: 150 mg/dL — ABNORMAL HIGH (ref 70–99)
Glucose-Capillary: 138 mg/dL — ABNORMAL HIGH (ref 70–99)

## 2024-05-14 MED ORDER — PREDNISONE 20 MG PO TABS
40.0000 mg | ORAL_TABLET | Freq: Every day | ORAL | 0 refills | Status: AC
  Filled 2024-05-14: qty 10, 5d supply, fill #0

## 2024-05-14 MED ORDER — NICOTINE POLACRILEX 2 MG MT GUM: 2.0000 mg | CHEWING_GUM | OROMUCOSAL | Status: DC | PRN

## 2024-05-14 MED ORDER — FLUTICASONE-SALMETEROL 100-50 MCG/ACT IN AEPB
1.0000 | INHALATION_SPRAY | Freq: Two times a day (BID) | RESPIRATORY_TRACT | 0 refills | Status: DC
Start: 2024-05-14 — End: 2024-07-14
  Filled 2024-05-14: qty 60, 30d supply, fill #0

## 2024-05-14 MED ORDER — IPRATROPIUM-ALBUTEROL 0.5-2.5 (3) MG/3ML IN SOLN
3.0000 mL | Freq: Four times a day (QID) | RESPIRATORY_TRACT | 0 refills | Status: DC
  Filled 2024-05-14: qty 360, 30d supply, fill #0

## 2024-05-14 NOTE — Hospital Course (Signed)
 73yo with h/o DM, HTN, and COPD on RA at home with medication noncompliance who presented on 7/6 with SOB and cough.  She was diagnosed with recurrent COPD exacerbation.

## 2024-05-14 NOTE — Progress Notes (Signed)
 TOC meds in a secure bag delivered to pt in room - placed in a patient belonging bag

## 2024-05-14 NOTE — Progress Notes (Signed)
 Mobility Specialist - Progress Note   05/14/24 1124  Oxygen  Therapy  SpO2 98 %  O2 Device Room Air  Patient Activity (if Appropriate) Ambulating  Mobility  Activity Ambulated independently in hallway  Level of Assistance Independent  Assistive Device None  Distance Ambulated (ft) 350 ft  Activity Response Tolerated well  Mobility Referral Yes  Mobility visit 1 Mobility  Mobility Specialist Start Time (ACUTE ONLY) 1115  Mobility Specialist Stop Time (ACUTE ONLY) 1124  Mobility Specialist Time Calculation (min) (ACUTE ONLY) 9 min   Nurse requested Mobility Specialist to perform oxygen  saturation test with pt which includes removing pt from oxygen  both at rest and while ambulating.  Below are the results from that testing.     Patient Saturations on Room Air at Rest = spO2 100%  Patient Saturations on Room Air while Ambulating = sp02 98% .    At end of testing pt left in room on 0  Liters of oxygen .  Reported results to nurse.  Pt received in bed and agreeable to mobility. Non productive coughing at EOS. No complaints during session. Pt to bed after session with all needs met.    [facility]

## 2024-05-14 NOTE — Care Management CC44 (Signed)
 Condition Code 44 Documentation Completed  Patient Details  Name: [name] MRN: [id number] Date of Birth: [birth year]   Condition Code 44 given:  Yes Patient signature on Condition Code 44 notice:  Yes Documentation of 2 MD's agreement:  Yes Code 44 added to claim:  Yes    Doneta Glenys DASEN, RN 05/14/2024, 1:25 PM

## 2024-05-14 NOTE — Plan of Care (Signed)

## 2024-05-14 NOTE — Progress Notes (Addendum)
 Discharge instructions were reviewed. Education provided regarding smoking cessation per pt request.She denied questions or concerns at this time. IV removed, site clean dry and intact. Pt made aware medication will be delivered from the [facility] outpatient pharmacy. Medications delivered

## 2024-05-14 NOTE — Discharge Summary (Addendum)
 Physician Discharge Summary   Patient: [name] MRN: [id number] DOB: 1951-06-10  Admit date:     05/13/2024  Discharge date: 05/14/24  Discharge Physician: Delon Herald   PCP: Elnora Ip, MD   Recommendations at discharge:   You are being discharged home and do not appear to need home oxygen  at this time Take prednisone  daily for 5 days Use Advair  inhaler twice daily every single day Use Duonebs every 6 hours every day Use Albuterol  nebulizer treatments or inhaler for breakthrough wheezing/cough/shortness of breath Smoking cessation is essential! Nicotine  lozenges or gum are appropriate if unable to tolerate nicotine  patches Pulmonology referral placed You have been referred for lung cancer screening and should be consulted with an appointment Take all blood pressure medications as ordered (amlodipine /Norvasc , losartan /Cozaar , and hydrochlorothiazide  once daily and metoprolol /Lopressor  twice daily) Follow up with Dr. Elnora in 1-2 weeks  Discharge Diagnoses: Principal Problem:   COPD with acute exacerbation (HCC) Active Problems:   Tobacco use disorder   Hypertension associated with chronic kidney disease due to type 2 diabetes mellitus (HCC)   Type 2 diabetes mellitus with complication, without long-term current use of insulin  [facility])    Hospital Course: 73yo with h/o DM, HTN, and COPD on RA at home with medication noncompliance who presented on 7/6 with SOB and cough.  She was diagnosed with recurrent COPD exacerbation.    Assessment and Plan:  Acute exacerbation of COPD Patient reports with increased cough, wheezing, dyspnea with exertion in the setting of URI + ongoing smoking No evidence of acute bacterial infection Negative for COVID, RSV and flu No documented O2 sats below 94% but she is on 2L [place] O2 at this time Ambulatory pulse ox 98%, walked 350 ft, does not qualify for home O2 Observed overnight, appears stable for dc this AM IV  Solu-Medrol  -> prednisone  Scheduled DuoNebs, with as needed albuterol  She reports inability to afford Trelegy at home, needs affordable controller medications and then needs to be compliant with them - Advair  ordered Outpatient pulmonology referral made   Non-insulin -dependent type 2 diabetes A1c 5.4 in 03/2024, excellent control Carb modified diet controlled  Uncontrolled hypertension Uncontrolled due to medication noncompliance Previously discharged on amlodipine  10 mg p.o. daily, and Hyzaar 1 tab p.o. twice daily as well as Lopressor  50 mg p.o. twice daily - will resume BP is 147/70 this AM  Tobacco dependence Ongoing smoking of 1ppd Cessation counseling provided, as cessation is crucial to prevent/slow further disease progression Declines nicotine  patch - reports that this made her very sick previously She is willing to try lozenges as an outpatient (ordered gum for her to try here)     Pain control - [place]  Controlled Substance Reporting System database was reviewed. and patient was instructed, not to drive, operate heavy machinery, perform activities at heights, swimming or participation in water activities or provide baby-sitting services while on Pain, Sleep and Anxiety Medications; until their outpatient Physician has advised to do so again. Also recommended to not to take more than prescribed Pain, Sleep and Anxiety Medications.   Disposition: Home Diet recommendation:  Carb modified diet DISCHARGE MEDICATION: Allergies as of 05/14/2024       Reactions   Ace Inhibitors Anaphylaxis, Swelling, Other (See Comments)   Throat swelling  (Re: Lisinopril -hydrochlorothiazide ) Pt questioned if she is allergic in 2025.        Medication List     STOP taking these medications    Trelegy Ellipta  100-62.5-25 MCG/ACT Aepb Generic drug: Fluticasone -Umeclidin-Vilant  TAKE these medications    acetaminophen  325 MG tablet Commonly known as: TYLENOL  Take 2  tablets (650 mg total) by mouth every 6 (six) hours as needed for mild pain (or Fever >/= 101).   albuterol  108 (90 Base) MCG/ACT inhaler Commonly known as: VENTOLIN  HFA Inhale 2 puffs into the lungs every 6 (six) hours as needed for wheezing or shortness of breath.   albuterol  (2.5 MG/3ML) 0.083% nebulizer solution Commonly known as: PROVENTIL  Take 3 mLs (2.5 mg total) by nebulization every 4 (four) hours as needed for wheezing or shortness of breath.   amLODipine  10 MG tablet Commonly known as: NORVASC  Take 1 tablet (10 mg total) by mouth daily.   fluticasone -salmeterol 100-50 MCG/ACT Aepb Commonly known as: ADVAIR  Inhale 1 puff into the lungs 2 (two) times daily.   guaiFENesin  600 MG 12 hr tablet Commonly known as: MUCINEX  Take 1 tablet (600 mg total) by mouth 2 (two) times daily.   guaiFENesin -dextromethorphan  100-10 MG/5ML syrup Commonly known as: ROBITUSSIN DM Take 10 mLs by mouth every 4 (four) hours as needed for cough.   ipratropium-albuterol  0.5-2.5 (3) MG/3ML Soln Commonly known as: DUONEB Take 3 mLs by nebulization 4 (four) times daily.   losartan -hydrochlorothiazide  50-12.5 MG tablet Commonly known as: HYZAAR Take 1 tablet by mouth 2 (two) times daily.   metoprolol  tartrate 50 MG tablet Commonly known as: LOPRESSOR  Take 1 tablet (50 mg total) by mouth 2 (two) times daily.   predniSONE  20 MG tablet Commonly known as: DELTASONE  Take 2 tablets (40 mg total) by mouth daily with breakfast for 5 days.        Discharge Exam:   Subjective: Feeling better today.  Initially said she might be able to go home in a few days, but after considering this (and ambulating without O2 around the floor), she agrees that she is probably ready to go home today.   Objective: Vitals:   05/14/24 1124 05/14/24 1144  BP:    Pulse:    Resp:    Temp:    SpO2: 98% 100%    Intake/Output Summary (Last 24 hours) at 05/14/2024 1226 Last data filed at 05/13/2024 1838 Gross per 24  hour  Intake 960 ml  Output --  Net 960 ml   Filed Weights   05/13/24 0530  Weight: 71.2 kg    Exam:  General:  Appears calm and comfortable and is in NAD, on [place] O2 Eyes:   normal lids, iris ENT:  grossly normal hearing, lips & tongue, mmm Cardiovascular:  RRR. No LE edema.  Respiratory:   Mild diffuse wheezes with good air movement.  Normal respiratory effort. Abdomen:  soft, NT, ND Skin:  no rash or induration seen on limited exam Musculoskeletal:  grossly normal tone BUE/BLE, good ROM, no bony abnormality Psychiatric:  grossly normal mood and affect, speech fluent and appropriate, AOx3 Neurologic:  CN 2-12 grossly intact, moves all extremities in coordinated fashion  Data Reviewed: I have reviewed the patient's lab results since admission.  Pertinent labs for today include:   Unremarkable labs on presentation    Condition at discharge: stable  The results of significant diagnostics from this hospitalization (including imaging, microbiology, ancillary and laboratory) are listed below for reference.   Imaging Studies: DG Chest Port 1 View Result Date: 05/13/2024 CLINICAL DATA:  Shortness of breath. EXAM: PORTABLE CHEST 1 VIEW COMPARISON:  04/18/2024 FINDINGS: Surgical clips noted in the right axilla. Heart size and mediastinal contours are normal. Lungs are hyperinflated with coarsened interstitial  markings of COPD/emphysema. No pleural fluid, interstitial edema or airspace disease. No pneumothorax. Postoperative changes noted within the lower cervical spine. Remote healed right rib fractures. IMPRESSION: 1. No acute findings. 2. COPD/emphysema. Electronically Signed   By: Waddell Calk M.D.   On: 05/13/2024 06:00   ECHOCARDIOGRAM COMPLETE Result Date: 04/19/2024    ECHOCARDIOGRAM REPORT   Patient Name:   [name] [facility] Date of Exam: 04/19/2024 Medical Rec #:  [id number]          Height:       66.0 in Accession #:    [id number]         Weight:       157.6 lb Date of Birth:   06/15/1951          BSA:          1.807 m Patient Age:    73 years           BP:           194/74 mmHg Patient Gender: F                  HR:           87 bpm. Exam Location:  Inpatient Procedure: 2D Echo, Color Doppler and Cardiac Doppler (Both Spectral and Color            Flow Doppler were utilized during procedure). Indications:    Abnormal ECG  History:        Patient has no prior history of Echocardiogram examinations.                 COPD; Risk Factors:Dyslipidemia and Hypertension.  Sonographer:    Philomena Daring Referring Phys: [id number] JESSICA U VANN IMPRESSIONS  1. Left ventricular ejection fraction, by estimation, is >75%. The left ventricle has hyperdynamic function. The left ventricle has no regional wall motion abnormalities. There is severe asymmetric left ventricular hypertrophy of the basal-septal segment. Left ventricular diastolic parameters are indeterminate.  2. Right ventricular systolic function is normal. The right ventricular size is normal. There is normal pulmonary artery systolic pressure.  3. The mitral valve is normal in structure. Trivial mitral valve regurgitation. No evidence of mitral stenosis.  4. The aortic valve is tricuspid. Aortic valve regurgitation is not visualized. No aortic stenosis is present.  5. The inferior vena cava is normal in size with greater than 50% respiratory variability, suggesting right atrial pressure of 3 mmHg. Comparison(s): Prior images unable to be directly viewed, comparison made by report only. FINDINGS  Left Ventricle: Left ventricular ejection fraction, by estimation, is >75%. The left ventricle has hyperdynamic function. The left ventricle has no regional wall motion abnormalities. The left ventricular internal cavity size was normal in size. There is severe asymmetric left ventricular hypertrophy of the basal-septal segment. Left ventricular diastolic parameters are indeterminate. Right Ventricle: The right ventricular size is normal. Right vetricular  wall thickness was not well visualized. Right ventricular systolic function is normal. There is normal pulmonary artery systolic pressure. The tricuspid regurgitant velocity is 2.48 m/s, and with an assumed right atrial pressure of 3 mmHg, the estimated right ventricular systolic pressure is 27.6 mmHg. Left Atrium: Left atrial size was normal in size. Right Atrium: Right atrial size was normal in size. Pericardium: There is no evidence of pericardial effusion. Mitral Valve: The mitral valve is normal in structure. Trivial mitral valve regurgitation. No evidence of mitral valve stenosis. Tricuspid Valve: The tricuspid valve is normal in structure. Tricuspid valve  regurgitation is mild . No evidence of tricuspid stenosis. Aortic Valve: The aortic valve is tricuspid. Aortic valve regurgitation is not visualized. No aortic stenosis is present. Pulmonic Valve: The pulmonic valve was not well visualized. Pulmonic valve regurgitation is not visualized. No evidence of pulmonic stenosis. Aorta: The aortic root, ascending aorta, aortic arch and descending aorta are all structurally normal, with no evidence of dilitation or obstruction. Venous: The inferior vena cava is normal in size with greater than 50% respiratory variability, suggesting right atrial pressure of 3 mmHg. IAS/Shunts: The atrial septum is grossly normal.  LEFT VENTRICLE PLAX 2D LVIDd:         4.10 cm   Diastology LVIDs:         2.00 cm   LV e' medial:    5.55 cm/s LV PW:         1.10 cm   LV E/e' medial:  14.7 LV IVS:        2.10 cm   LV e' lateral:   6.53 cm/s LVOT diam:     2.00 cm   LV E/e' lateral: 12.5 LV SV:         111 LV SV Index:   62 LVOT Area:     3.14 cm  RIGHT VENTRICLE             IVC RV S prime:     10.60 cm/s  IVC diam: 1.60 cm TAPSE (M-mode): 1.8 cm LEFT ATRIUM             Index        RIGHT ATRIUM           Index LA diam:        3.10 cm 1.72 cm/m   RA Area:     11.90 cm LA Vol (A2C):   22.2 ml 12.28 ml/m  RA Volume:   25.70 ml  14.22  ml/m LA Vol (A4C):   40.9 ml 22.63 ml/m LA Biplane Vol: 32.6 ml 18.04 ml/m  AORTIC VALVE LVOT Vmax:   192.00 cm/s LVOT Vmean:  129.000 cm/s LVOT VTI:    0.354 m  AORTA Ao Root diam: 2.60 cm Ao Asc diam:  2.90 cm MITRAL VALVE                TRICUSPID VALVE MV Area (PHT): 4.21 cm     TR Peak grad:   24.6 mmHg MV Decel Time: 180 msec     TR Vmax:        248.00 cm/s MV E velocity: 81.50 cm/s MV A velocity: 135.00 cm/s  SHUNTS MV E/A ratio:  0.60         Systemic VTI:  0.35 m                             Systemic Diam: 2.00 cm Shelda Bruckner MD Electronically signed by Shelda Bruckner MD Signature Date/Time: 04/19/2024/7:58:53 PM    Final    DG Chest Portable 1 View Result Date: 04/18/2024 CLINICAL DATA:  Dyspnea. EXAM: PORTABLE CHEST 1 VIEW COMPARISON:  February 17, 2024. FINDINGS: The heart size and mediastinal contours are within normal limits. Both lungs are clear. Old right rib fractures are noted. IMPRESSION: No active disease. Electronically Signed   By: Lynwood Landy Raddle M.D.   On: 04/18/2024 18:04    Microbiology: Results for orders placed or performed during the hospital encounter of 05/13/24  Resp panel by RT-PCR (RSV, Flu  A&B, Covid) Anterior Nasal Swab     Status: None   Collection Time: 05/13/24  6:51 AM   Specimen: Anterior Nasal Swab  Result Value Ref Range Status   SARS Coronavirus 2 by RT PCR NEGATIVE NEGATIVE Final    Comment: (NOTE) SARS-CoV-2 target nucleic acids are NOT DETECTED.  The SARS-CoV-2 RNA is generally detectable in upper respiratory specimens during the acute phase of infection. The lowest concentration of SARS-CoV-2 viral copies this assay can detect is 138 copies/mL. A negative result does not preclude SARS-Cov-2 infection and should not be used as the sole basis for treatment or other patient management decisions. A negative result may occur with  improper specimen collection/handling, submission of specimen other than nasopharyngeal swab, presence of  viral mutation(s) within the areas targeted by this assay, and inadequate number of viral copies(<138 copies/mL). A negative result must be combined with clinical observations, patient history, and epidemiological information. The expected result is Negative.  Fact Sheet for Patients:  BloggerCourse.com  Fact Sheet for Healthcare Providers:  SeriousBroker.it  This test is no t yet approved or cleared by the [place]  FDA and  has been authorized for detection and/or diagnosis of SARS-CoV-2 by FDA under an Emergency Use Authorization (EUA). This EUA will remain  in effect (meaning this test can be used) for the duration of the COVID-19 declaration under Section 564(b)(1) of the Act, 21 U.S.C.section 360bbb-3(b)(1), unless the authorization is terminated  or revoked sooner.       Influenza A by PCR NEGATIVE NEGATIVE Final   Influenza B by PCR NEGATIVE NEGATIVE Final    Comment: (NOTE) The Xpert Xpress SARS-CoV-2/FLU/RSV plus assay is intended as an aid in the diagnosis of influenza from Nasopharyngeal swab specimens and should not be used as a sole basis for treatment. Nasal washings and aspirates are unacceptable for Xpert Xpress SARS-CoV-2/FLU/RSV testing.  Fact Sheet for Patients: BloggerCourse.com  Fact Sheet for Healthcare Providers: SeriousBroker.it  This test is not yet approved or cleared by the [place]  FDA and has been authorized for detection and/or diagnosis of SARS-CoV-2 by FDA under an Emergency Use Authorization (EUA). This EUA will remain in effect (meaning this test can be used) for the duration of the COVID-19 declaration under Section 564(b)(1) of the Act, 21 U.S.C. section 360bbb-3(b)(1), unless the authorization is terminated or revoked.     Resp Syncytial Virus by PCR NEGATIVE NEGATIVE Final    Comment: (NOTE) Fact Sheet for  Patients: BloggerCourse.com  Fact Sheet for Healthcare Providers: SeriousBroker.it  This test is not yet approved or cleared by the [place]  FDA and has been authorized for detection and/or diagnosis of SARS-CoV-2 by FDA under an Emergency Use Authorization (EUA). This EUA will remain in effect (meaning this test can be used) for the duration of the COVID-19 declaration under Section 564(b)(1) of the Act, 21 U.S.C. section 360bbb-3(b)(1), unless the authorization is terminated or revoked.  Performed at [facility], [address] [place], [place] [address]     Labs: CBC: Recent Labs  Lab 05/13/24 0539  WBC 4.2  HGB 12.9  HCT 38.6  MCV 89.8  PLT 330   Basic Metabolic Panel: Recent Labs  Lab 05/13/24 0539  NA 138  K 3.5  CL 101  CO2 26  GLUCOSE 103*  BUN 8  CREATININE 0.62  CALCIUM  8.8*  MG 1.9   Liver Function Tests: Recent Labs  Lab 05/13/24 0539  AST 16  ALT 10  ALKPHOS 104  BILITOT  1.2  PROT 7.4  ALBUMIN 3.9   CBG: Recent Labs  Lab 05/13/24 1223 05/13/24 1649 05/13/24 2057 05/14/24 0742 05/14/24 1142  GLUCAP 220* 80 176* 150* 138*    Discharge time spent: greater than 30 minutes.  Signed: Delon Herald, MD [facility] 05/14/2024

## 2024-05-14 NOTE — Care Management Important Message (Signed)
 Important Message  Patient Details  Name: [name] MRN: [id number] Date of Birth: Apr 12, 1951   Important Message Given:  Yes - Medicare IM     Doneta Glenys DASEN, RN 05/14/2024, 1:25 PM

## 2024-05-14 NOTE — Care Management Obs Status (Signed)
 MEDICARE OBSERVATION STATUS NOTIFICATION   Patient Details  Name: [name] MRN: [id number] Date of Birth: 05/11/1951   Medicare Observation Status Notification Given:  Yes    Doneta Glenys DASEN, RN 05/14/2024, 1:19 PM

## 2024-05-14 NOTE — TOC Progression Note (Addendum)
 Transition of Care [facility] - Progression Note    Patient Details  Name: [name] MRN: [id number] Date of Birth: October 23, 1951  Transition of Care [facility]) CM/SW Contact  Doneta Glenys DASEN, RN Phone Number: 05/14/2024, 2:02 PM  Clinical Narrative:    CODE 44 completed. Per MD patient ready for discharge. No TOC needs identified. TOC signing off   Expected Discharge Plan: Home/Self Care Barriers to Discharge: No Barriers Identified  Expected Discharge Plan and Services   Discharge Planning Services: CM Consult   Living arrangements for the past 2 months: Single Family Home Expected Discharge Date: 05/14/24                                     Social Determinants of Health (SDOH) Interventions SDOH Screenings   Food Insecurity: No Food Insecurity (05/13/2024)  Housing: Low Risk  (05/13/2024)  Transportation Needs: No Transportation Needs (05/13/2024)  Utilities: Not At Risk (05/13/2024)  Alcohol  Screen: Low Risk  (12/13/2022)  Depression (PHQ2-9): Low Risk  (03/19/2024)  Recent Concern: Depression (PHQ2-9) - Medium Risk (01/03/2024)  Financial Resource Strain: Low Risk  (12/13/2022)  Physical Activity: Inactive (01/03/2024)  Social Connections: Moderately Integrated (05/13/2024)  Stress: Stress Concern Present (01/03/2024)  Tobacco Use: High Risk (05/13/2024)    Readmission Risk Interventions    05/13/2024   12:43 PM 08/20/2022   11:07 AM  Readmission Risk Prevention Plan  Post Dischage Appt  Complete  Medication Screening  Complete  Transportation Screening Complete Complete  PCP or Specialist Appt within 5-7 Days Complete   Home Care Screening Complete   Medication Review (RN CM) Complete

## 2024-05-15 ENCOUNTER — Telehealth: Payer: Self-pay

## 2024-05-15 NOTE — Transitions of Care (Post Inpatient/ED Visit) (Signed)
 05/15/2024  Name: [name] MRN: [id number] DOB: [birth year]  Today's TOC FU Call Status: Today's TOC FU Call Status:: Successful TOC FU Call Completed TOC FU Call Complete Date: 05/15/24 Patient's Name and Date of Birth confirmed.  Transition Care Management Follow-up Telephone Call Date of Discharge: 05/14/24 Discharge Facility: [facility]) Type of Discharge: Inpatient Admission Primary Inpatient Discharge Diagnosis:: COPD How have you been since you were released from the hospital?: Better Any questions or concerns?: No  Items Reviewed: Did you receive and understand the discharge instructions provided?: Yes Medications obtained,verified, and reconciled?: Yes (Medications Reviewed) Any new allergies since your discharge?: No Dietary orders reviewed?: Yes Do you have support at home?: No  Medications Reviewed Today: Medications Reviewed Today     Reviewed by Emmitt Pan, LPN (Licensed Practical Nurse) on 05/15/24 at 0919  Med List Status: <None>   Medication Order Taking? Sig Documenting Provider Last Dose Status Informant  acetaminophen  (TYLENOL ) 325 MG tablet [id number] Yes Take 2 tablets (650 mg total) by mouth every 6 (six) hours as needed for mild pain (or Fever >/= 101). Franchot Novel, MD  Active Self, Pharmacy Records  albuterol  (PROVENTIL ) (2.5 MG/3ML) 0.083% nebulizer solution [id number] Yes Take 3 mLs (2.5 mg total) by nebulization every 4 (four) hours as needed for wheezing or shortness of breath. Gomez-Caraballo, Maria, MD  Active Self, Pharmacy Records  albuterol  (VENTOLIN  HFA) 108 [phone] Base) MCG/ACT inhaler [id number] Yes Inhale 2 puffs into the lungs every 6 (six) hours as needed for wheezing or shortness of breath. Jolaine Pac, DO  Active Self, Pharmacy Records  amLODipine  (NORVASC ) 10 MG tablet [id number] Yes Take 1 tablet (10 mg total) by mouth daily. Fernand Prost, MD  Active Self, Pharmacy Records  fluticasone -salmeterol (ADVAIR ) 100-50  MCG/ACT AEPB [id number] Yes Inhale 1 puff into the lungs 2 (two) times daily. Barbarann Nest, MD  Active   guaiFENesin  (MUCINEX ) 600 MG 12 hr tablet [id number] Yes Take 1 tablet (600 mg total) by mouth 2 (two) times daily. Vann, Jessica U, DO  Active Self, Pharmacy Records           Med Note EVERETTE, JESUSA JAYSON Repress May 13, 2024  7:55 AM) Per pt uses as needed  guaiFENesin -dextromethorphan  (ROBITUSSIN DM) 100-10 MG/5ML syrup [id number] Yes Take 10 mLs by mouth every 4 (four) hours as needed for cough. Vann, Jessica U, DO  Active Self, Pharmacy Records           Med Note EVERETTE, JESUSA JAYSON Repress May 13, 2024  7:55 AM) Per pt uses as needed  ipratropium-albuterol  (DUONEB) 0.5-2.5 (3) MG/3ML SOLN [id number] Yes Take 3 mLs by nebulization 4 (four) times daily. Barbarann Nest, MD  Active   losartan -hydrochlorothiazide  [facility]) 50-12.5 MG tablet [id number] Yes Take 1 tablet by mouth 2 (two) times daily. Gomez-Caraballo, Maria, MD  Active Self, Pharmacy Records  metoprolol  tartrate (LOPRESSOR ) 50 MG tablet [id number] Yes Take 1 tablet (50 mg total) by mouth 2 (two) times daily. Vann, Jessica U, DO  Active Self, Pharmacy Records  predniSONE  (DELTASONE ) 20 MG tablet [id number] Yes Take 2 tablets (40 mg total) by mouth daily with breakfast for 5 days. Barbarann Nest, MD  Active   Med List Note Steffi Nian, CPhT 04/19/24 1411): History of intermittent compliance             Home Care and Equipment/Supplies: Were Home Health Services Ordered?: NA Any new equipment or medical supplies ordered?: NA  Functional Questionnaire: Do you need assistance  with bathing/showering or dressing?: No Do you need assistance with meal preparation?: No Do you need assistance with eating?: No Do you have difficulty maintaining continence: No Do you need assistance with getting out of bed/getting out of a chair/moving?: No Do you have difficulty managing or taking your medications?: No  Follow up appointments  reviewed: PCP Follow-up appointment confirmed?: No (sent message to staff to schedule) MD Provider Line Number:[phone] Given: No Specialist Hospital Follow-up appointment confirmed?: No Reason Specialist Follow-Up Not Confirmed: Patient has Specialist Provider Number and will Call for Appointment Do you need transportation to your follow-up appointment?: No Do you understand care options if your condition(s) worsen?: Yes-patient verbalized understanding    SIGNATURE Julian Lemmings, LPN [facility] Nurse Health Advisor Direct Dial [phone]

## 2024-05-17 ENCOUNTER — Other Ambulatory Visit: Payer: Self-pay

## 2024-05-17 NOTE — Transitions of Care (Post Inpatient/ED Visit) (Signed)
 Transition of Care week 4  Visit Note  05/17/2024  Name: [name] MRN: [id number]          DOB: 27-May-1951  Situation: Patient enrolled in [facility] 30-day program. Visit completed with Barnie Lady by telephone.   Background:     Past Medical History:  Diagnosis Date   Acute sinusitis 01/09/2019   Acute sinusitis 01/09/2019   Anxiety    Arthritis    fingers, knees (08/16/2018)   Asthma    Atopic dermatitis 03/20/2018   Axillary hidradenitis suppurativa 10/13/2018   Cancer of right breast (HCC) 1991   s/p lumpectomy, chemotherapy and radiation therapy in 1991. Mammogram in 2007 was normal.   Cellulitis of buttock, left 12/13/2022   Constipated    h/o   COPD (chronic obstructive pulmonary disease) (HCC)    History of multiple hospital admissions for exercabation    COPD exacerbation (HCC) 01/01/2019   COPD exacerbation (HCC) 02/17/2024   COPD with acute exacerbation (HCC) 01/14/2019   COPD with exacerbation (HCC) 04/06/2009   Qualifier: Diagnosis of  By: Loletta MD, Vijay     Depression    Diarrhea    h/o   Facial cellulitis 08/30/2022   Facial edema 08/31/2022   GERD (gastroesophageal reflux disease)    Grade I diastolic dysfunction 01/12/2024   Headache    a few times/month (08/16/2018   Heart murmur 10/05/2011   first time I ever heard I had one was today   History of breast cancer 12/01/2012   Pt with h/o breast CA s/p lumpectomy with chemo/radiation in 1991. Pt mammogram 2011 was unremarkable. Had CT chest 10/2012 in ED for SOB and showed spiculated nodule with lymph node. 12/04/12: Birads 2; repeat diagnostic mammogram in 1 year.     Hyperlipidemia    Hypertension    Lower extremity edema 09/21/2018   Obesity    Personal history of chemotherapy    Personal history of radiation therapy    Pneumonia    couple times in the last 10-15 yrs (08/16/2018)   QT prolongation 08/08/2014   Seasonal allergies 02/25/2017   Shortness of breath 10/05/2011    at rest; lying down; w/exertion   Sigmoid diverticulitis 80/2008   Tobacco abuse    Type 2 diabetes mellitus (HCC) 05/14/2009   Type II diabetes mellitus (HCC)     Assessment: Patient Reported Symptoms: Cognitive Cognitive Status: Alert and oriented to person, place, and time      Neurological Neurological Review of Symptoms: No symptoms reported    HEENT HEENT Symptoms Reported: No symptoms reported      Cardiovascular Cardiovascular Symptoms Reported: No symptoms reported Weight: 157 lb (71.2 kg) Cardiovascular Self-Management Outcome: 3 (uncertain) Cardiovascular Comment: The patient does not weigh herself  Respiratory Respiratory Symptoms Reported: Productive cough, Shortness of breath Additional Respiratory Details: The patient went to the ED 05/13/24 due to COPD exacerbation. She states she had a cold and it triggered her COPD. She was started on Advair  Disc and Duonebs. She has been counseled on smoking cessation. She declines Nicotine  patches due to nausea and was recommended Nicotine  gum Respiratory Management Strategies: Medication therapy, Routine screening, Activity Respiratory Self-Management Outcome: 3 (uncertain)  Endocrine Endocrine Symptoms Reported: No symptoms reported Is patient diabetic?: Yes Is patient checking blood sugars at home?: No Endocrine Self-Management Outcome: 4 (good)  Gastrointestinal Gastrointestinal Symptoms Reported: No symptoms reported      Genitourinary Genitourinary Symptoms Reported: No symptoms reported    Integumentary Integumentary Symptoms Reported: No symptoms reported  Musculoskeletal Musculoskelatal Symptoms Reviewed: No symptoms reported        Psychosocial Psychosocial Symptoms Reported: No symptoms reported         There were no vitals filed for this visit.  Medications Reviewed Today     Reviewed by [name], RN (Case Manager) on 05/17/24 at 1332  Med List Status: <None>   Medication Order Taking? Sig  Documenting Provider Last Dose Status Informant  acetaminophen  (TYLENOL ) 325 MG tablet [id number]  Take 2 tablets (650 mg total) by mouth every 6 (six) hours as needed for mild pain (or Fever >/= 101). Franchot Novel, MD  Active Self, Pharmacy Records  albuterol  (PROVENTIL ) (2.5 MG/3ML) 0.083% nebulizer solution [id number]  Take 3 mLs (2.5 mg total) by nebulization every 4 (four) hours as needed for wheezing or shortness of breath. Gomez-Caraballo, Maria, MD  Active Self, Pharmacy Records  albuterol  (VENTOLIN  HFA) 108 [phone] Base) MCG/ACT inhaler [id number]  Inhale 2 puffs into the lungs every 6 (six) hours as needed for wheezing or shortness of breath. Jolaine Pac, DO  Active Self, Pharmacy Records  amLODipine  (NORVASC ) 10 MG tablet [id number]  Take 1 tablet (10 mg total) by mouth daily. Fernand Prost, MD  Active Self, Pharmacy Records  fluticasone -salmeterol (ADVAIR ) 100-50 MCG/ACT AEPB [id number]  Inhale 1 puff into the lungs 2 (two) times daily. Barbarann Nest, MD  Active   guaiFENesin  (MUCINEX ) 600 MG 12 hr tablet [id number]  Take 1 tablet (600 mg total) by mouth 2 (two) times daily. Vann, Jessica U, DO  Active Self, Pharmacy Records           Med Note EVERETTE, JESUSA JAYSON Repress May 13, 2024  7:55 AM) Per pt uses as needed  guaiFENesin -dextromethorphan  (ROBITUSSIN DM) 100-10 MG/5ML syrup [id number]  Take 10 mLs by mouth every 4 (four) hours as needed for cough. Vann, Jessica U, DO  Active Self, Pharmacy Records           Med Note EVERETTE, JESUSA JAYSON Repress May 13, 2024  7:55 AM) Per pt uses as needed  ipratropium-albuterol  (DUONEB) 0.5-2.5 (3) MG/3ML SOLN [id number]  Take 3 mLs by nebulization 4 (four) times daily. Barbarann Nest, MD  Active   losartan -hydrochlorothiazide  [facility]) 50-12.5 MG tablet [id number]  Take 1 tablet by mouth 2 (two) times daily. Elnora Ip, MD  Active Self, Pharmacy Records  metoprolol  tartrate (LOPRESSOR ) 50 MG tablet [id number]  Take 1 tablet (50 mg total) by mouth 2  (two) times daily. Vann, Jessica U, DO  Active Self, Pharmacy Records  predniSONE  (DELTASONE ) 20 MG tablet [id number]  Take 2 tablets (40 mg total) by mouth daily with breakfast for 5 days. Barbarann Nest, MD  Active   Med List Note Steffi Nian, CPhT 04/19/24 1411): History of intermittent compliance             Recommendation:   PCP Follow-up Continue Current Plan of Care  Follow Up Plan:   Telephone follow-up in 1 week  Medford Moises, BSN, RN [place]  [facility] - [facility] RN Care Manager [phone]

## 2024-05-17 NOTE — Telephone Encounter (Signed)
 Pt called and appt schedule 7/15 @ 1315 PM.

## 2024-05-17 NOTE — Patient Instructions (Signed)
 Visit Information  Thank you for taking time to visit with me today. Please don't hesitate to contact me if I can be of assistance to you before our next scheduled telephone appointment.  Our next appointment is by telephone on Thursday July 17th at 1:00pm  Following is a copy of your care plan:   Goals Addressed             This Visit's Progress    [facility]BCI Transitions of Care (TOC) Care Plan       Problems: (reviewed 05/17/24) Recent Hospitalization for treatment of COPD Medication access barrier The patient is on disability and states she cannot always afford her medication, Medication management barrier : The patient has a history of non-compliance, and No Hospital Follow Up Provider appointment CM to send a request for the provider to call the patient 04/26/24 - The provider called the patient and she requested a call back because she needed to talk with her husband 04/26/24 - The [facility]BC[facility] pharmacy provider reached out the patient to assist with medication compliance. The patient states she doesn't remember talking to her. The note is incomplete in Epic. 04/27/24 - The pharmacist reached out to her for a scheduled appointment on 04/27/24 and the patient did not answer the phone 05/03/24 - The patient has an appointment with PCP 05/08/24 05/09/24 - The patient cancelled the appointment and rescheduled for 05/14/24. Confirmed transportation 05/17/24 - The patient was in the hospital for her scheduled PCP appointment. Scheduled for August 6  Goal: (reviewed 05/17/24) Over the next 30 days, the patient will not experience hospital readmission  Interventions: (reviewed 05/17/24)  COPD Interventions: (reviewed 05/17/24) Advised patient to track and manage COPD triggers Assessed social determinant of health barriers Discussed the importance of adequate rest and management of fatigue with COPD Provided instruction about proper use of medications used for management of COPD including inhalers Referral made to  community resources care guide team for assistance with Pharmacy referral: Completed 04/25/24 - The pharmacy team has not connected with the patient. They have left messages and the patient has not returned calls.   Screening for signs and symptoms of depression related to chronic disease state  Educate the patient on the medications such as Mucinex , Prednisone  and inhalers that aide in COPD triggers 05/17/24 - The patient went to the ED 05/13/24 due to a cold and it triggered her COPD. Her Trelegy was discontinued and she is now on Advair  Disc twice daily, Duonebs and Prednisone  for another 5 days. Smoking Cessation. The patient declines Nicotine  patches and will try the Nicotine  gum. She is trying to cut back on the number of cigarettes she smokes daily  Patient Self Care Activities: (reviewed 05/17/24) Attend all scheduled provider appointments Call pharmacy for medication refills 3-7 days in advance of running out of medications Call provider office for new concerns or questions  Notify RN Care Manager of [facility] call rescheduling needs Participate in Transition of Care Program/Attend [facility] scheduled calls Perform all self care activities independently  Take medications as prescribed   Complete Pharmacy referral - return the phone call to the pharmacist - 05/09/24 - Not completed  Plan:  Telephone follow up appointment with care management team member scheduled for:  Thursday July 17th at 1:00pm        Patient verbalizes understanding of instructions and care plan provided today and agrees to view in MyChart. Active MyChart status and patient understanding of how to access instructions and care plan via MyChart confirmed with  patient.     The patient has been provided with contact information for the care management team and has been advised to call with any health related questions or concerns.   Please call the care guide team at [phone] if you need to cancel or reschedule your appointment.    Please call the Suicide and Crisis Lifeline: 988 call the [place]  National Suicide Prevention Lifeline: 819-316-2698 or TTY: 708-297-2151 TTY 640 635 2410) to talk to a trained counselor if you are experiencing a Mental Health or Behavioral Health Crisis or need someone to talk to.  Medford Balboa, BSN, RN   VBCI - [employer] [facility] RN Care Manager [phone]

## 2024-05-20 NOTE — Progress Notes (Deleted)
 Patient name: [name] Date of birth: [birth year] Date of visit: 05/21/24  Type of visit: Established Patient Office Visit   Subjective   Chief concern: No chief complaint on file.   [name] is a 73 y.o. female with a PMHx of COPD, T2DM, HTN, TUD, and history of medication non-adherence who presents to [facility] clinic for hospital follow up.  Of note, Ms. Cavins was also hospitalized in June for another COPD exacerbation. She was admitted again on July 7th due to non-adherence to her medication regimen.  Follow up Hospitalization  Patient was admitted to [facility] on 05/13/24 and discharged on 05/14/24. She was treated for COPD Exacerbation. Treatment for this included IV ? PO steroids. Telephone follow up was done on 05/15/24 She reports fair compliance with treatment. She reports this condition is improved.  ----------------------------------------------------------------------------------------- -    Patient Active Problem List   Diagnosis Date Noted   COPD with acute exacerbation (HCC) 05/14/2024   Hypertensive urgency 04/19/2024   COPD exacerbation (HCC) 04/18/2024   Memory changes 03/28/2023   Age-related osteoporosis without current pathological fracture  03/06/2021   Healthcare maintenance 03/06/2021   Chronic hip pain, right 06/22/2019   Diabetic neuropathy (HCC) 05/21/2019   Type 2 diabetes mellitus with complication, without long-term current use of insulin  (HCC) 12/26/2018   Chronic sinusitis    COPD (chronic obstructive pulmonary disease) (HCC) 12/24/2014   Acute hypoxic respiratory failure (HCC)    Dyslipidemia associated with type 2 diabetes mellitus (HCC) 06/14/2014   Breast nodule 12/01/2012   Primary osteoarthritis of right knee 09/29/2012   Anxiety 12/30/2009   Tobacco use disorder 04/06/2009   Hypertension associated with chronic kidney disease due to type 2 diabetes mellitus (HCC) 04/06/2009     Past Surgical  History:  Procedure Laterality Date   ABDOMINAL HYSTERECTOMY     ANTERIOR CERVICAL DECOMP/DISCECTOMY FUSION  2012   Dr. Beuford  put plate in; did something to my vertebrae   BACK SURGERY     BREAST LUMPECTOMY Right 1991   DOBUTAMINE  STRESS ECHO  08/2004   Inferior ischemia, normal LV systolic function, no significant CAD   MINOR IRRIGATION AND DEBRIDEMENT OF WOUND Right 09/04/2022   Procedure: IRRIGATION AND DEBRIDEMENT OF NASAL WOUND;  Surgeon: Luciano Standing, MD;  Location: [facility] OR;  Service: ENT;  Laterality: Right;    ROS  Current Outpatient Medications  Medication Instructions   acetaminophen  (TYLENOL ) 650 mg, Oral, Every 6 hours PRN   albuterol  (PROVENTIL ) 2.5 mg, Nebulization, Every 4 hours PRN   albuterol  (VENTOLIN  HFA) 108 (90 Base) MCG/ACT inhaler 2 puffs, Inhalation, Every 6 hours PRN   amLODipine  (NORVASC ) 10 mg, Oral, Daily   fluticasone -salmeterol (ADVAIR ) 100-50 MCG/ACT AEPB 1 puff, Inhalation, 2 times daily   guaiFENesin  (MUCINEX ) 600 mg, Oral, 2 times daily   guaiFENesin -dextromethorphan  (ROBITUSSIN DM) 100-10 MG/5ML syrup 10 mLs, Oral, Every 4 hours PRN   ipratropium-albuterol  (DUONEB) 0.5-2.5 (3) MG/3ML SOLN 3 mLs, Nebulization, 4 times daily   losartan -hydrochlorothiazide  (HYZAAR) 50-12.5 MG tablet 1 tablet, Oral, 2 times daily   metoprolol  tartrate (LOPRESSOR ) 50 mg, Oral, 2 times daily    Social History   Tobacco Use   Smoking status: Every Day    Current packs/day: 0.50    Average packs/day: 0.5 packs/day for 45.0 years (22.5 ttl pk-yrs)    Types: Cigarettes   Smokeless tobacco: Never   Tobacco comments:    1/2 pk per day  Vaping Use   Vaping  status: Never Used  Substance Use Topics   Alcohol  use: Yes    Alcohol /week: 4.0 standard drinks of alcohol     Types: 4 Cans of beer per week    Comment: 08/16/2018 weekends only   Drug use: No      Objective  There were no vitals filed for this visit.There is no height or weight on file to calculate  BMI.   Physical Exam  Last CBC Lab Results  Component Value Date   WBC 4.2 05/13/2024   HGB 12.9 05/13/2024   HCT 38.6 05/13/2024   MCV 89.8 05/13/2024   MCH 30.0 05/13/2024   RDW 14.4 05/13/2024   PLT 330 05/13/2024   Last metabolic panel Lab Results  Component Value Date   GLUCOSE 103 (H) 05/13/2024   NA 138 05/13/2024   K 3.5 05/13/2024   CL 101 05/13/2024   CO2 26 05/13/2024   BUN 8 05/13/2024   CREATININE 0.62 05/13/2024   GFRNONAA >60 05/13/2024   CALCIUM  8.8 (L) 05/13/2024   PHOS 3.6 01/12/2024   PROT 7.4 05/13/2024   ALBUMIN 3.9 05/13/2024   LABGLOB 2.7 06/21/2018   AGRATIO 1.6 06/21/2018   BILITOT 1.2 05/13/2024   ALKPHOS 104 05/13/2024   AST 16 05/13/2024   ALT 10 05/13/2024   ANIONGAP 11 05/13/2024        Assessment & Plan  Problem List Items Addressed This Visit       Cardiovascular and Mediastinum   Hypertension associated with chronic kidney disease due to type 2 diabetes mellitus (HCC)     Respiratory   COPD (chronic obstructive pulmonary disease) (HCC) - Primary     Endocrine   Type 2 diabetes mellitus with complication, without long-term current use of insulin  (HCC) (Chronic)     Other   Tobacco use disorder    No follow-ups on file.  COPD exacerbation/ chronic copd -prednisone  daily for 5 days -advair  inhaler bid every day -Duonebs every 6 hours every day -Albuterol  nebulizer or inhaler prn  HTN -amlodipine  10 mg daily -losaratan-hydrochlorothiazide  50-12.5mg  bid -metoprolol  tartrate 50mg  bid    Patient discussed with Dr. {[id number]}, who also saw and evaluated the patient.  Jossalin Chervenak, MD [facility]  PGY-1 05/21/2024, 1:11 PM

## 2024-05-21 ENCOUNTER — Telehealth: Payer: Self-pay | Admitting: Student

## 2024-05-21 ENCOUNTER — Encounter

## 2024-05-21 DIAGNOSIS — J449 Chronic obstructive pulmonary disease, unspecified: Secondary | ICD-10-CM

## 2024-05-21 DIAGNOSIS — E1122 Type 2 diabetes mellitus with diabetic chronic kidney disease: Secondary | ICD-10-CM

## 2024-05-21 DIAGNOSIS — E118 Type 2 diabetes mellitus with unspecified complications: Secondary | ICD-10-CM

## 2024-05-21 DIAGNOSIS — F17200 Nicotine dependence, unspecified, uncomplicated: Secondary | ICD-10-CM

## 2024-05-21 NOTE — Assessment & Plan Note (Deleted)
 Patient reports currently smoking ~1ppd.  -smoking cessation counseled -offered nicotine  patches

## 2024-05-21 NOTE — Progress Notes (Signed)
 Complex Care Management Care Guide Note  05/21/2024 Name: [name] MRN: [id number] DOB: August 28, 1951  [name] is a 73 y.o. year old female who is a primary care patient of Elnora Ip, MD and is actively engaged with the care management team. I reached out to [name] by phone today to assist with re-scheduling  with the Licensed Clinical Child psychotherapist.  Follow up plan: Telephone appointment with complex care management team member scheduled for:  7/21  Harlene Satterfield  [facility]  [facility], [facility] Guide  Direct Dial: [phone]  Fax [phone]

## 2024-05-21 NOTE — Telephone Encounter (Signed)
 No showed today's appt with Dr. Waymond. Patient was contacted via telephone to reschedule.  Agreed to rescheduling HFU for next Monday 05/28/2024 at 8:15 am.  Patient will also be mailed an appt reminder letter.

## 2024-05-22 ENCOUNTER — Ambulatory Visit

## 2024-05-24 ENCOUNTER — Inpatient Hospital Stay (HOSPITAL_COMMUNITY)
Admission: EM | Admit: 2024-05-24 | Discharge: 2024-05-29 | DRG: 191 | Disposition: A | Discharge status: Home / Self Care | Attending: Internal Medicine | Admitting: Internal Medicine

## 2024-05-24 ENCOUNTER — Telehealth: Payer: Self-pay

## 2024-05-24 ENCOUNTER — Encounter (HOSPITAL_COMMUNITY): Payer: Self-pay | Admitting: Emergency Medicine

## 2024-05-24 ENCOUNTER — Other Ambulatory Visit: Payer: Self-pay

## 2024-05-24 ENCOUNTER — Emergency Department (HOSPITAL_COMMUNITY)

## 2024-05-24 DIAGNOSIS — E1169 Type 2 diabetes mellitus with other specified complication: Secondary | ICD-10-CM | POA: Diagnosis not present

## 2024-05-24 DIAGNOSIS — E785 Hyperlipidemia, unspecified: Secondary | ICD-10-CM | POA: Diagnosis not present

## 2024-05-24 DIAGNOSIS — Z853 Personal history of malignant neoplasm of breast: Secondary | ICD-10-CM | POA: Diagnosis not present

## 2024-05-24 DIAGNOSIS — I10 Essential (primary) hypertension: Secondary | ICD-10-CM | POA: Diagnosis not present

## 2024-05-24 DIAGNOSIS — J441 Chronic obstructive pulmonary disease with (acute) exacerbation: Principal | ICD-10-CM | POA: Diagnosis present

## 2024-05-24 DIAGNOSIS — E871 Hypo-osmolality and hyponatremia: Secondary | ICD-10-CM | POA: Diagnosis present

## 2024-05-24 DIAGNOSIS — Z1152 Encounter for screening for COVID-19: Secondary | ICD-10-CM

## 2024-05-24 DIAGNOSIS — E118 Type 2 diabetes mellitus with unspecified complications: Secondary | ICD-10-CM | POA: Diagnosis present

## 2024-05-24 DIAGNOSIS — Z809 Family history of malignant neoplasm, unspecified: Secondary | ICD-10-CM | POA: Diagnosis not present

## 2024-05-24 DIAGNOSIS — Z7951 Long term (current) use of inhaled steroids: Secondary | ICD-10-CM | POA: Diagnosis not present

## 2024-05-24 DIAGNOSIS — Z803 Family history of malignant neoplasm of breast: Secondary | ICD-10-CM

## 2024-05-24 DIAGNOSIS — Z716 Tobacco abuse counseling: Secondary | ICD-10-CM

## 2024-05-24 DIAGNOSIS — R0602 Shortness of breath: Secondary | ICD-10-CM

## 2024-05-24 DIAGNOSIS — R062 Wheezing: Secondary | ICD-10-CM | POA: Diagnosis not present

## 2024-05-24 DIAGNOSIS — Z981 Arthrodesis status: Secondary | ICD-10-CM

## 2024-05-24 DIAGNOSIS — Z888 Allergy status to other drugs, medicaments and biological substances status: Secondary | ICD-10-CM

## 2024-05-24 DIAGNOSIS — K219 Gastro-esophageal reflux disease without esophagitis: Secondary | ICD-10-CM | POA: Diagnosis not present

## 2024-05-24 DIAGNOSIS — F17210 Nicotine dependence, cigarettes, uncomplicated: Secondary | ICD-10-CM | POA: Diagnosis present

## 2024-05-24 DIAGNOSIS — Z923 Personal history of irradiation: Secondary | ICD-10-CM | POA: Diagnosis not present

## 2024-05-24 DIAGNOSIS — Z79899 Other long term (current) drug therapy: Secondary | ICD-10-CM | POA: Diagnosis not present

## 2024-05-24 DIAGNOSIS — F17200 Nicotine dependence, unspecified, uncomplicated: Secondary | ICD-10-CM | POA: Diagnosis present

## 2024-05-24 DIAGNOSIS — Z9221 Personal history of antineoplastic chemotherapy: Secondary | ICD-10-CM

## 2024-05-24 LAB — RESPIRATORY PANEL BY PCR

## 2024-05-24 LAB — CBC WITH DIFFERENTIAL/PLATELET
Abs Immature Granulocytes: 0.01 K/uL (ref 0.00–0.07)
Basophils Absolute: 0 K/uL (ref 0.0–0.1)
Basophils Relative: 0 %
Eosinophils Absolute: 0.2 K/uL (ref 0.0–0.5)
Eosinophils Relative: 3 %
HCT: 40 % (ref 36.0–46.0)
Hemoglobin: 12.9 g/dL (ref 12.0–15.0)
Immature Granulocytes: 0 %
Lymphocytes Relative: 35 %
Lymphs Abs: 2.2 K/uL (ref 0.7–4.0)
MCH: 29.2 pg (ref 26.0–34.0)
MCHC: 32.3 g/dL (ref 30.0–36.0)
MCV: 90.5 fL (ref 80.0–100.0)
Monocytes Absolute: 0.5 K/uL (ref 0.1–1.0)
Monocytes Relative: 8 %
Neutro Abs: 3.4 K/uL (ref 1.7–7.7)
Neutrophils Relative %: 54 %
Platelets: 351 K/uL (ref 150–400)
RBC: 4.42 MIL/uL (ref 3.87–5.11)
RDW: 14 % (ref 11.5–15.5)
WBC: 6.3 K/uL (ref 4.0–10.5)
nRBC: 0 % (ref 0.0–0.2)

## 2024-05-24 LAB — BASIC METABOLIC PANEL WITH GFR
Anion gap: 11 (ref 5–15)
BUN: 8 mg/dL (ref 8–23)
CO2: 26 mmol/L (ref 22–32)
Calcium: 8.8 mg/dL — ABNORMAL LOW (ref 8.9–10.3)
Chloride: 101 mmol/L (ref 98–111)
Creatinine, Ser: 0.73 mg/dL (ref 0.44–1.00)
GFR, Estimated: >60 mL/min (ref >60)
Glucose, Bld: 111 mg/dL — ABNORMAL HIGH (ref 70–99)
Potassium: 3.6 mmol/L (ref 3.5–5.1)
Sodium: 138 mmol/L (ref 135–145)

## 2024-05-24 LAB — RESP PANEL BY RT-PCR (RSV, FLU A&B, COVID)  RVPGX2
Influenza A by PCR: NEGATIVE
Influenza B by PCR: NEGATIVE
Resp Syncytial Virus by PCR: NEGATIVE
SARS Coronavirus 2 by RT PCR: NEGATIVE

## 2024-05-24 MED ORDER — METOPROLOL TARTRATE 25 MG PO TABS
50.0000 mg | ORAL_TABLET | Freq: Two times a day (BID) | ORAL | Status: DC
  Administered 2024-05-24 – 2024-05-29 (×11): 50 mg via ORAL
  Filled 2024-05-24 (×11): qty 2

## 2024-05-24 MED ORDER — LORATADINE 10 MG PO TABS
10.0000 mg | ORAL_TABLET | Freq: Every day | ORAL | Status: DC
  Administered 2024-05-24 – 2024-05-29 (×6): 10 mg via ORAL
  Filled 2024-05-24 (×6): qty 1

## 2024-05-24 MED ORDER — IPRATROPIUM BROMIDE 0.02 % IN SOLN
0.5000 mg | Freq: Once | RESPIRATORY_TRACT | Status: AC
  Administered 2024-05-24: 0.5 mg via RESPIRATORY_TRACT
  Filled 2024-05-24: qty 2.5

## 2024-05-24 MED ORDER — ONDANSETRON HCL 4 MG/2ML IJ SOLN
4.0000 mg | Freq: Four times a day (QID) | INTRAMUSCULAR | Status: DC | PRN
Start: 2024-05-24 — End: 2024-05-29

## 2024-05-24 MED ORDER — HYDROCHLOROTHIAZIDE 12.5 MG PO TABS
12.5000 mg | ORAL_TABLET | Freq: Every day | ORAL | Status: DC
  Administered 2024-05-24 – 2024-05-29 (×6): 12.5 mg via ORAL
  Filled 2024-05-24 (×6): qty 1

## 2024-05-24 MED ORDER — DEXAMETHASONE SODIUM PHOSPHATE 10 MG/ML IJ SOLN
10.0000 mg | Freq: Once | INTRAMUSCULAR | Status: AC
  Administered 2024-05-24: 10 mg via INTRAVENOUS
  Filled 2024-05-24: qty 1

## 2024-05-24 MED ORDER — ALBUTEROL SULFATE (2.5 MG/3ML) 0.083% IN NEBU
10.0000 mg/h | INHALATION_SOLUTION | Freq: Once | RESPIRATORY_TRACT | Status: AC
  Administered 2024-05-24: 10 mg/h via RESPIRATORY_TRACT
  Filled 2024-05-24: qty 12

## 2024-05-24 MED ORDER — ALBUTEROL SULFATE (2.5 MG/3ML) 0.083% IN NEBU
2.5000 mg | INHALATION_SOLUTION | RESPIRATORY_TRACT | Status: DC | PRN
  Administered 2024-05-25 – 2024-05-29 (×8): 2.5 mg via RESPIRATORY_TRACT
  Filled 2024-05-24 (×8): qty 3

## 2024-05-24 MED ORDER — PANTOPRAZOLE SODIUM 40 MG PO TBEC
40.0000 mg | DELAYED_RELEASE_TABLET | Freq: Every day | ORAL | Status: DC
  Administered 2024-05-24 – 2024-05-29 (×6): 40 mg via ORAL
  Filled 2024-05-24 (×6): qty 1

## 2024-05-24 MED ORDER — ENOXAPARIN SODIUM 40 MG/0.4ML IJ SOSY
40.0000 mg | PREFILLED_SYRINGE | Freq: Every day | INTRAMUSCULAR | Status: DC
  Administered 2024-05-24 – 2024-05-28 (×5): 40 mg via SUBCUTANEOUS
  Filled 2024-05-24 (×5): qty 0.4

## 2024-05-24 MED ORDER — ONDANSETRON HCL 4 MG PO TABS: 4.0000 mg | ORAL_TABLET | Freq: Four times a day (QID) | ORAL | Status: DC | PRN

## 2024-05-24 MED ORDER — TRAZODONE HCL 50 MG PO TABS
25.0000 mg | ORAL_TABLET | Freq: Every evening | ORAL | Status: DC | PRN
  Administered 2024-05-24 – 2024-05-28 (×5): 25 mg via ORAL
  Filled 2024-05-24 (×6): qty 1

## 2024-05-24 MED ORDER — ORAL CARE MOUTH RINSE
15.0000 mL | OROMUCOSAL | Status: DC | PRN
Start: 2024-05-24 — End: 2024-05-29

## 2024-05-24 MED ORDER — ACETAMINOPHEN 650 MG RE SUPP
650.0000 mg | Freq: Four times a day (QID) | RECTAL | Status: DC | PRN
Start: 2024-05-24 — End: 2024-05-29

## 2024-05-24 MED ORDER — LOSARTAN POTASSIUM 50 MG PO TABS
50.0000 mg | ORAL_TABLET | Freq: Every day | ORAL | Status: DC
  Administered 2024-05-24 – 2024-05-29 (×6): 50 mg via ORAL
  Filled 2024-05-24 (×6): qty 1

## 2024-05-24 MED ORDER — METHYLPREDNISOLONE SODIUM SUCC 40 MG IJ SOLR
40.0000 mg | Freq: Two times a day (BID) | INTRAMUSCULAR | Status: DC
  Administered 2024-05-25: 40 mg via INTRAVENOUS
  Filled 2024-05-24: qty 1

## 2024-05-24 MED ORDER — AMLODIPINE BESYLATE 5 MG PO TABS
10.0000 mg | ORAL_TABLET | Freq: Every day | ORAL | Status: DC
  Administered 2024-05-24 – 2024-05-29 (×6): 10 mg via ORAL
  Filled 2024-05-24 (×6): qty 2

## 2024-05-24 MED ORDER — ACETAMINOPHEN 325 MG PO TABS
650.0000 mg | ORAL_TABLET | Freq: Four times a day (QID) | ORAL | Status: DC | PRN
  Administered 2024-05-24: 650 mg via ORAL
  Filled 2024-05-24: qty 2

## 2024-05-24 MED ORDER — LOSARTAN POTASSIUM-HCTZ 50-12.5 MG PO TABS: 1.0000 | ORAL_TABLET | Freq: Two times a day (BID) | ORAL | Status: DC

## 2024-05-24 MED ORDER — MAGNESIUM SULFATE 2 GM/50ML IV SOLN
2.0000 g | Freq: Once | INTRAVENOUS | Status: AC
  Administered 2024-05-24: 2 g via INTRAVENOUS
  Filled 2024-05-24: qty 50

## 2024-05-24 MED ORDER — GUAIFENESIN-DM 100-10 MG/5ML PO SYRP
5.0000 mL | ORAL_SOLUTION | ORAL | Status: DC | PRN
  Administered 2024-05-24 – 2024-05-27 (×3): 5 mL via ORAL
  Filled 2024-05-24 (×3): qty 10

## 2024-05-24 MED ORDER — IPRATROPIUM-ALBUTEROL 0.5-2.5 (3) MG/3ML IN SOLN
3.0000 mL | Freq: Four times a day (QID) | RESPIRATORY_TRACT | Status: DC
  Administered 2024-05-24 – 2024-05-26 (×9): 3 mL via RESPIRATORY_TRACT
  Filled 2024-05-24 (×9): qty 3

## 2024-05-24 NOTE — ED Triage Notes (Signed)
 Pt in POV with sob and respiratory distress, breathing labored with audible wheezes on arrival. States no relief with home neb trxs

## 2024-05-24 NOTE — ED Provider Notes (Signed)
 [place] EMERGENCY DEPARTMENT AT [facility] Provider Note   CSN: [id number] Arrival date & time: 05/24/24  9474     Patient presents with: Shortness of Breath   [name] is a 73 y.o. female.   HPI     This is a 73 year old female who presents with shortness of breath.  History of significant COPD.  She continues to smoke.  Reports that she has had worsening shortness of breath for the last 24 hours.  Has been using her home meds with no relief.  No fevers.  Has had a cough.  Denies chest pain.  Prior to Admission medications   Medication Sig Start Date End Date Taking? Authorizing Provider  acetaminophen  (TYLENOL ) 325 MG tablet Take 2 tablets (650 mg total) by mouth every 6 (six) hours as needed for mild pain (or Fever >/= 101). 08/23/22   Franchot Novel, MD  albuterol  (PROVENTIL ) (2.5 MG/3ML) 0.083% nebulizer solution Take 3 mLs (2.5 mg total) by nebulization every 4 (four) hours as needed for wheezing or shortness of breath. 04/21/24   Elnora Ip, MD  albuterol  (VENTOLIN  HFA) 108 (90 Base) MCG/ACT inhaler Inhale 2 puffs into the lungs every 6 (six) hours as needed for wheezing or shortness of breath. 01/10/24   Jolaine Pac, DO  amLODipine  (NORVASC ) 10 MG tablet Take 1 tablet (10 mg total) by mouth daily. 03/19/24 03/19/25  Fernand Prost, MD  fluticasone -salmeterol (ADVAIR ) 100-50 MCG/ACT AEPB Inhale 1 puff into the lungs 2 (two) times daily. 05/14/24   Barbarann Nest, MD  guaiFENesin  (MUCINEX ) 600 MG 12 hr tablet Take 1 tablet (600 mg total) by mouth 2 (two) times daily. 04/22/24   Vann, Jessica U, DO  guaiFENesin -dextromethorphan  (ROBITUSSIN DM) 100-10 MG/5ML syrup Take 10 mLs by mouth every 4 (four) hours as needed for cough. 04/22/24   Vann, Jessica U, DO  ipratropium-albuterol  (DUONEB) 0.5-2.5 (3) MG/3ML SOLN Take 3 mLs by nebulization 4 (four) times daily. 05/14/24   Barbarann Nest, MD  losartan -hydrochlorothiazide  (HYZAAR) 50-12.5 MG tablet Take  1 tablet by mouth 2 (two) times daily. 03/20/24   Elnora Ip, MD  metoprolol  tartrate (LOPRESSOR ) 50 MG tablet Take 1 tablet (50 mg total) by mouth 2 (two) times daily. 04/22/24   Vann, Jessica U, DO    Allergies: Ace inhibitors    Review of Systems  Constitutional:  Negative for fever.  Respiratory:  Positive for cough and shortness of breath.   Cardiovascular:  Negative for chest pain.  All other systems reviewed and are negative.   Updated Vital Signs BP (!) 171/84   Pulse (!) 105   Temp 98.7 F (37.1 C)   Resp (!) 31   Ht 1.676 m (5' 6)   Wt 71.2 kg   SpO2 100%   BMI 25.34 kg/m   Physical Exam Vitals and nursing note reviewed.  Constitutional:      Appearance: She is ill-appearing. She is not toxic-appearing.  HENT:     Head: Normocephalic and atraumatic.  Eyes:     Pupils: Pupils are equal, round, and reactive to light.  Cardiovascular:     Rate and Rhythm: Regular rhythm. Tachycardia present.     Heart sounds: Normal heart sounds.  Pulmonary:     Effort: Pulmonary effort is normal. No respiratory distress.     Breath sounds: Wheezing present.     Comments: Poor air movement, tachypnea, wheezing in all lung fields, speaking in short sentences Abdominal:     General: Bowel sounds are normal.  Palpations: Abdomen is soft.  Musculoskeletal:     Cervical back: Neck supple.  Skin:    General: Skin is warm and dry.  Neurological:     Mental Status: She is alert and oriented to person, place, and time.  Psychiatric:        Mood and Affect: Mood normal.     (all labs ordered are listed, but only abnormal results are displayed) Labs Reviewed  BASIC METABOLIC PANEL WITH GFR - Abnormal; Notable for the following components:      Result Value   Glucose, Bld 111 (*)    Calcium  8.8 (*)    All other components within normal limits  CBC WITH DIFFERENTIAL/PLATELET    EKG: EKG Interpretation Date/Time:  Thursday May 24 2024 05:41:52 EDT Ventricular  Rate:  113 PR Interval:  132 QRS Duration:  91 QT Interval:  337 QTC Calculation: 462 R Axis:   73  Text Interpretation: Sinus tachycardia Right atrial enlargement Borderline ST depression, diffuse leads No significant change since last tracing Confirmed by Bari Pfeiffer ([id number]) on 05/24/2024 6:22:46 AM  Radiology: [facility] Chest Portable 1 View Result Date: 05/24/2024 CLINICAL DATA:  Wheezing and shortness of breath. EXAM: PORTABLE CHEST 1 VIEW COMPARISON:  05/13/2024 FINDINGS: Heart size and mediastinal contours are normal. Lungs are hyperinflated but clear. No pleural fluid, interstitial edema or airspace consolidation. Surgical clips noted in the right axilla. Status post ACDF in the lower cervical spine. IMPRESSION: 1. No acute cardiopulmonary disease. Electronically Signed   By: Waddell Calk M.D.   On: 05/24/2024 06:39     .Critical Care  Performed by: Bari Pfeiffer FALCON, MD Authorized by: Bari Pfeiffer FALCON, MD   Critical care provider statement:    Critical care time (minutes):  35   Critical care was necessary to treat or prevent imminent or life-threatening deterioration of the following conditions:  Respiratory failure   Critical care was time spent personally by me on the following activities:  Development of treatment plan with patient or surrogate, discussions with consultants, evaluation of patient's response to treatment, examination of patient, ordering and review of laboratory studies, ordering and review of radiographic studies, ordering and performing treatments and interventions, pulse oximetry, re-evaluation of patient's condition and review of old charts    Medications Ordered in the ED  dexamethasone  (DECADRON ) injection 10 mg (10 mg Intravenous Given 05/24/24 0551)  magnesium  sulfate IVPB 2 g 50 mL (0 g Intravenous Stopped 05/24/24 0610)  albuterol  (PROVENTIL ) (2.5 MG/3ML) 0.083% nebulizer solution (10 mg/hr Nebulization Given 05/24/24 0555)  ipratropium (ATROVENT )  nebulizer solution 0.5 mg (0.5 mg Nebulization Given 05/24/24 0551)    Clinical Course as of 05/24/24 0653  Thu May 24, 2024  0652 Patient currently receiving continuous neb.  Appears more comfortable and is less tachypneic.  Improved aeration.  Continued wheezing. [CH]    Clinical Course User Index [CH] Sunny Gains, Pfeiffer FALCON, MD                                 Medical Decision Making Amount and/or Complexity of Data Reviewed Labs: ordered. Radiology: ordered.  Risk Prescription drug management.   This patient presents to the ED for concern of shortness of breath, this involves an extensive number of treatment options, and is a complaint that carries with it a high risk of complications and morbidity.  I considered the following differential and admission for this acute, potentially life threatening condition.  The differential diagnosis includes COPD, pneumonia, pneumothorax  MDM:    This is a 73 year old female who presents with recurrent shortness of breath.  History of significant COPD.  She continues to smoke.  She is tachypneic and tachycardic.  She is in mild respiratory distress on my initial evaluation.  She has fairly poor air movement with wheezing in all lung fields.  Highly suspect COPD.  Recently had negative viral testing during recent admission.  Chest x-ray obtained and shows no evidence of pneumonia or pneumothorax.  EKG without evidence of acute ischemia or arrhythmia.  Patient given IV magnesium  and a continuous DuoNeb.  10 mg of IM Decadron .  (Labs, imaging, consults)  Labs: I Ordered, and personally interpreted labs.  The pertinent results include: CBC, BMP  Imaging Studies ordered: I ordered imaging studies including chest x-ray I independently visualized and interpreted imaging. I agree with the radiologist interpretation  Additional history obtained from chart review.  External records from outside source obtained and reviewed including recent discharge  summary  Cardiac Monitoring: The patient was maintained on a cardiac monitor.  If on the cardiac monitor, I personally viewed and interpreted the cardiac monitored which showed an underlying rhythm of: Sinus tachycardia  Reevaluation: After the interventions noted above, I reevaluated the patient and found that they have :Mild improvement  Social Determinants of Health:  smoker  Disposition: Pending  Co morbidities that complicate the patient evaluation  Past Medical History:  Diagnosis Date   Acute sinusitis 01/09/2019   Acute sinusitis 01/09/2019   Anxiety    Arthritis    fingers, knees (08/16/2018)   Asthma    Atopic dermatitis 03/20/2018   Axillary hidradenitis suppurativa 10/13/2018   Cancer of right breast (HCC) 1991   s/p lumpectomy, chemotherapy and radiation therapy in 1991. Mammogram in 2007 was normal.   Cellulitis of buttock, left 12/13/2022   Constipated    h/o   COPD (chronic obstructive pulmonary disease) (HCC)    History of multiple hospital admissions for exercabation    COPD exacerbation (HCC) 01/01/2019   COPD exacerbation (HCC) 02/17/2024   COPD with acute exacerbation (HCC) 01/14/2019   COPD with exacerbation (HCC) 04/06/2009   Qualifier: Diagnosis of  By: Loletta MD, Vijay     Depression    Diarrhea    h/o   Facial cellulitis 08/30/2022   Facial edema 08/31/2022   GERD (gastroesophageal reflux disease)    Grade I diastolic dysfunction 01/12/2024   Headache    a few times/month (08/16/2018   Heart murmur 10/05/2011   first time I ever heard I had one was today   History of breast cancer 12/01/2012   Pt with h/o breast CA s/p lumpectomy with chemo/radiation in 1991. Pt mammogram 2011 was unremarkable. Had CT chest 10/2012 in ED for SOB and showed spiculated nodule with lymph node. 12/04/12: Birads 2; repeat diagnostic mammogram in 1 year.     Hyperlipidemia    Hypertension    Lower extremity edema 09/21/2018   Obesity    Personal history of  chemotherapy    Personal history of radiation therapy    Pneumonia    couple times in the last 10-15 yrs (08/16/2018)   QT prolongation 08/08/2014   Seasonal allergies 02/25/2017   Shortness of breath 10/05/2011   at rest; lying down; w/exertion   Sigmoid diverticulitis 80/2008   Tobacco abuse    Type 2 diabetes mellitus (HCC) 05/14/2009   Type II diabetes mellitus (HCC)      Medicines  Meds ordered this encounter  Medications   dexamethasone  (DECADRON ) injection 10 mg   magnesium  sulfate IVPB 2 g 50 mL   albuterol  (PROVENTIL ) (2.5 MG/3ML) 0.083% nebulizer solution   ipratropium (ATROVENT ) nebulizer solution 0.5 mg    I have reviewed the patients home medicines and have made adjustments as needed  Problem List / ED Course: Problem List Items Addressed This Visit       Respiratory   COPD exacerbation (HCC) - Primary             Final diagnoses:  COPD exacerbation [facility])    ED Discharge Orders     None          Bari Charmaine FALCON, MD 05/24/24 [phone]

## 2024-05-24 NOTE — Consult Note (Signed)
 NAME:  [name], MRN:  [id number], DOB:  07-14-1951, LOS: 0 ADMISSION DATE:  05/24/2024, CONSULTATION DATE: 05/24/2024 REFERRING MD:  Dr Zella, CHIEF COMPLAINT: COPD exacerbation  History of Present Illness:  73 year old active smoker (30 pack years) with a history of COPD, chronic bronchitic symptoms and frequent exacerbations.  She has been hospitalized 4 times this year and has frequent ER visits, just discharged 05/14/2024 to complete a short prednisone  taper.  She was last seen in our office in 2020.  Other past medical history significant for obesity, diabetes, hypertension with diastolic dysfunction, GERD, remote breast cancer.  She has been on Trelegy in the past, unable to obtain due to cost, discharge most recently on Advair  which she has apparently been only taking daily.  She still smokes 1/2 pack cigarettes daily.  She is not on home oxygen . Presents now with URI symptoms, nasal congestion, cough minimally productive of sputum, progressive dyspnea.  Also some upper airway raspiness.  She denies any heartburn or GERD symptoms.  There has been no one else sick in the home.  She is concerned that she was sent home too soon on 7/7.   She was admitted with another exacerbation, started on Solu-Medrol , scheduled DuoNeb.  COVID-19, Influenza and RSV are all negative.  Chest x-ray clear.  Pertinent  Medical History   Past Medical History:  Diagnosis Date   Acute sinusitis 01/09/2019   Acute sinusitis 01/09/2019   Anxiety    Arthritis    fingers, knees (08/16/2018)   Asthma    Atopic dermatitis 03/20/2018   Axillary hidradenitis suppurativa 10/13/2018   Cancer of right breast (HCC) 1991   s/p lumpectomy, chemotherapy and radiation therapy in 1991. Mammogram in 2007 was normal.   Cellulitis of buttock, left 12/13/2022   Constipated    h/o   COPD (chronic obstructive pulmonary disease) (HCC)    History of multiple hospital admissions for exercabation    COPD exacerbation  (HCC) 01/01/2019   COPD exacerbation (HCC) 02/17/2024   COPD with acute exacerbation (HCC) 01/14/2019   COPD with exacerbation (HCC) 04/06/2009   Qualifier: Diagnosis of  By: Loletta MD, Vijay     Depression    Diarrhea    h/o   Facial cellulitis 08/30/2022   Facial edema 08/31/2022   GERD (gastroesophageal reflux disease)    Grade I diastolic dysfunction 01/12/2024   Headache    a few times/month (08/16/2018   Heart murmur 10/05/2011   first time I ever heard I had one was today   History of breast cancer 12/01/2012   Pt with h/o breast CA s/p lumpectomy with chemo/radiation in 1991. Pt mammogram 2011 was unremarkable. Had CT chest 10/2012 in ED for SOB and showed spiculated nodule with lymph node. 12/04/12: Birads 2; repeat diagnostic mammogram in 1 year.     Hyperlipidemia    Hypertension    Lower extremity edema 09/21/2018   Obesity    Personal history of chemotherapy    Personal history of radiation therapy    Pneumonia    couple times in the last 10-15 yrs (08/16/2018)   QT prolongation 08/08/2014   Seasonal allergies 02/25/2017   Shortness of breath 10/05/2011   at rest; lying down; w/exertion   Sigmoid diverticulitis 80/2008   Tobacco abuse    Type 2 diabetes mellitus (HCC) 05/14/2009   Type II diabetes mellitus (HCC)      Significant Hospital Events: Including procedures, antibiotic start and stop dates in addition to other pertinent events  Pulmonary function testing 06/23/2015 >> moderately severe obstruction without a bronchodilator response, FEV1 1.48 L (68% predicted), Normal lung volumes, decreased diffusion capacity that corrects to the normal range or adjusted for alveolar volume.  Flow volume loop consistent with obstruction. Echocardiogram 04/19/2024 >> LVEF 75%, hyperdynamic.  Asymmetric left ventricular basal septal hypertrophy with indeterminate diastolic parameters.  Normal RV size and function.  Normal PASP.  Interim History / Subjective:     Objective    Blood pressure (!) 151/75, pulse 96, temperature 98.2 F (36.8 C), temperature source Oral, resp. rate 18, height 5' 6 (1.676 m), weight 71.2 kg, SpO2 96%.        Intake/Output Summary (Last 24 hours) at 05/24/2024 1407 Last data filed at 05/24/2024 1336 Gross per 24 hour  Intake 240 ml  Output --  Net 240 ml   Filed Weights   05/24/24 0545 05/24/24 0559  Weight: 71.2 kg 71.2 kg    Examination: General: Elderly woman laying in bed in no distress.  Somewhat stoic, depressed affect HENT: Oropharynx clear.  She has some upper airway rhonchi, low pitched noise in her throat on expiration. Lungs: Referred upper airway noise, good air movement.  No overt wheezes Cardiovascular: Regular, no murmur Abdomen: Nondistended with positive bowel sounds Extremities: No edema Neuro: Awake, alert, appropriate, follows commands, nonfocal GU: Deferred  Resolved problem list   Assessment and Plan   COPD Hypertension with diastolic dysfunction, hypertensive on admission Frequent exacerbations of acute dyspnea, question components of COPD, upper airway irritation syndrome, diastolic CHF.  On exam today most of her noise is upper airway in nature. GERD Continued tobacco use  - Agree with Solu-Medrol , probably transition to prednisone  on 7/18 - DuoNeb scheduled. - She will need to get back on LABA/LAMA/ICS.  He was unable to afford/obtain Trelegy.  Question whether other options will be more affordable, consider Breztri . - Needs to reestablish with pulmonary as an outpatient - Needs repeat dedicated pulmonary function testing -Continue amlodipine , metoprolol , Hyzaar.  Question whether this is an adequate regimen given her hypertension on presentation.  There may be a contribution of diastolic CHF contributing to the frequency of her recent flares. -She is not currently on a regimen for GERD.  Will consider treating her empirically as a potential exacerbating factor precipitant  of flaring COPD symptoms as well as upper airway irritation, cough -Desperately needs tobacco cessation.  Suspect that this is a huge driver of her frequent flares. -She will need a formal walking oximetry prior to discharge to ensure that she should not be on home oxygen     Labs   CBC: Recent Labs  Lab 05/24/24 0538  WBC 6.3  NEUTROABS 3.4  HGB 12.9  HCT 40.0  MCV 90.5  PLT 351    Basic Metabolic Panel: Recent Labs  Lab 05/24/24 0538  NA 138  K 3.6  CL 101  CO2 26  GLUCOSE 111*  BUN 8  CREATININE 0.73  CALCIUM  8.8*   GFR: Estimated Creatinine Clearance: 63.4 mL/min (by C-G formula based on SCr of 0.73 mg/dL). Recent Labs  Lab 05/24/24 0538  WBC 6.3    Liver Function Tests: No results for input(s): AST, ALT, ALKPHOS, BILITOT, PROT, ALBUMIN in the last 168 hours. No results for input(s): LIPASE, AMYLASE in the last 168 hours. No results for input(s): AMMONIA in the last 168 hours.  ABG    Component Value Date/Time   PHART 7.453 (H) 01/15/2019 0910   PCO2ART 37.4 01/15/2019 0910   PO2ART 77.0 (  L) 01/15/2019 0910   HCO3 29.3 (H) 05/13/2024 0551   TCO2 27 11/03/2021 1728   O2SAT 58.9 05/13/2024 0551     Coagulation Profile: No results for input(s): INR, PROTIME in the last 168 hours.  Cardiac Enzymes: No results for input(s): CKTOTAL, CKMB, CKMBINDEX, TROPONINI in the last 168 hours.  HbA1C: Hemoglobin A1C  Date/Time Value Ref Range Status  03/19/2024 10:30 AM 5.4 4.0 - 5.6 % Final  03/28/2023 09:32 AM 5.0 4.0 - 5.6 % Final   Hgb A1c MFr Bld  Date/Time Value Ref Range Status  12/13/2022 10:10 AM 5.2 4.8 - 5.6 % Final    Comment:             Prediabetes: 5.7 - 6.4          Diabetes: >6.4          Glycemic control for adults with diabetes: <7.0   08/31/2022 03:21 AM 6.5 (H) 4.8 - 5.6 % Final    Comment:    (NOTE) Pre diabetes:          5.7%-6.4%  Diabetes:              >6.4%  Glycemic control for    <7.0% adults with diabetes     CBG: No results for input(s): GLUCAP in the last 168 hours.  Review of Systems:   As per HPI  Past Medical History:  She,  has a past medical history of Acute sinusitis (01/09/2019), Acute sinusitis (01/09/2019), Anxiety, Arthritis, Asthma, Atopic dermatitis (03/20/2018), Axillary hidradenitis suppurativa (10/13/2018), Cancer of right breast (HCC) (1991), Cellulitis of buttock, left (12/13/2022), Constipated, COPD (chronic obstructive pulmonary disease) (HCC), COPD exacerbation (HCC) (01/01/2019), COPD exacerbation (HCC) (02/17/2024), COPD with acute exacerbation (HCC) (01/14/2019), COPD with exacerbation (HCC) (04/06/2009), Depression, Diarrhea, Facial cellulitis (08/30/2022), Facial edema (08/31/2022), GERD (gastroesophageal reflux disease), Grade I diastolic dysfunction (01/12/2024), Headache, Heart murmur (10/05/2011), History of breast cancer (12/01/2012), Hyperlipidemia, Hypertension, Lower extremity edema (09/21/2018), Obesity, Personal history of chemotherapy, Personal history of radiation therapy, Pneumonia, QT prolongation (08/08/2014), Seasonal allergies (02/25/2017), Shortness of breath (10/05/2011), Sigmoid diverticulitis (80/2008), Tobacco abuse, Type 2 diabetes mellitus (HCC) (05/14/2009), and Type II diabetes mellitus (HCC).   Surgical History:   Past Surgical History:  Procedure Laterality Date   ABDOMINAL HYSTERECTOMY     ANTERIOR CERVICAL DECOMP/DISCECTOMY FUSION  2012   Dr. Beuford  put plate in; did something to my vertebrae   BACK SURGERY     BREAST LUMPECTOMY Right 1991   DOBUTAMINE  STRESS ECHO  08/2004   Inferior ischemia, normal LV systolic function, no significant CAD   MINOR IRRIGATION AND DEBRIDEMENT OF WOUND Right 09/04/2022   Procedure: IRRIGATION AND DEBRIDEMENT OF NASAL WOUND;  Surgeon: Luciano Standing, MD;  Location: [facility] OR;  Service: ENT;  Laterality: Right;     Social History:   reports that she has been smoking  cigarettes. She has a 22.5 pack-year smoking history. She has never used smokeless tobacco. She reports current alcohol  use of about 4.0 standard drinks of alcohol  per week. She reports that she does not use drugs.   Family History:  Her family history includes Breast cancer in her daughter; Breast cancer (age of onset: 35) in her daughter; Cancer in her mother. There is no history of BRCA 1/2.   Allergies Allergies  Allergen Reactions   Ace Inhibitors Anaphylaxis, Swelling and Other (See Comments)    Throat swelling  (Re: Lisinopril -hydrochlorothiazide )  Pt questioned if she is allergic in 2025.  Home Medications  Prior to Admission medications   Medication Sig Start Date End Date Taking? Authorizing Provider  acetaminophen  (TYLENOL ) 500 MG tablet Take 1,000 mg by mouth daily as needed for moderate pain (pain score 4-6) or mild pain (pain score 1-3).   Yes [provider]  albuterol  (PROVENTIL ) (2.5 MG/3ML) 0.083% nebulizer solution Take 3 mLs (2.5 mg total) by nebulization every 4 (four) hours as needed for wheezing or shortness of breath. 04/21/24  Yes Elnora Ip, MD  albuterol  (VENTOLIN  HFA) 108 (90 Base) MCG/ACT inhaler Inhale 2 puffs into the lungs every 6 (six) hours as needed for wheezing or shortness of breath. Patient taking differently: Inhale 2 puffs into the lungs daily as needed for wheezing or shortness of breath. 01/10/24  Yes Jolaine Pac, DO  amLODipine  (NORVASC ) 10 MG tablet Take 1 tablet (10 mg total) by mouth daily. 03/19/24 03/19/25 Yes Fernand Prost, MD  fluticasone -salmeterol (ADVAIR ) 100-50 MCG/ACT AEPB Inhale 1 puff into the lungs 2 (two) times daily. Patient taking differently: Inhale 1 puff into the lungs daily. 05/14/24  Yes Barbarann Nest, MD  ipratropium-albuterol  (DUONEB) 0.5-2.5 (3) MG/3ML SOLN Take 3 mLs by nebulization 4 (four) times daily. Patient taking differently: Take 3 mLs by nebulization daily as needed (wheezing/sob). 05/14/24  Yes  Barbarann Nest, MD  losartan -hydrochlorothiazide  (HYZAAR) 50-12.5 MG tablet Take 1 tablet by mouth 2 (two) times daily. 03/20/24  Yes Elnora Ip, MD  metoprolol  tartrate (LOPRESSOR ) 50 MG tablet Take 1 tablet (50 mg total) by mouth 2 (two) times daily. 04/22/24  Yes Vann, Jessica U, DO  guaiFENesin -dextromethorphan  (ROBITUSSIN DM) 100-10 MG/5ML syrup Take 10 mLs by mouth every 4 (four) hours as needed for cough. Patient not taking: Reported on 05/24/2024 04/22/24   Vann, Jessica U, DO  predniSONE  (DELTASONE ) 20 MG tablet Take 20 mg by mouth daily with breakfast. Patient not taking: Reported on 05/24/2024    [provider]     Critical care time: NA     Lamar Chris, MD, PhD 05/24/2024, 2:07 PM [facility] [phone] or if no answer before 7:00PM call [phone] For any issues after 7:00PM please call eLink [phone]

## 2024-05-24 NOTE — ED Provider Notes (Signed)
  Physical Exam  BP (!) 171/84   Pulse (!) 105   Temp 98.7 F (37.1 C)   Resp (!) 31   Ht 5' 6 (1.676 m)   Wt 71.2 kg   SpO2 100%   BMI 25.34 kg/m   Physical Exam  Procedures  Procedures  ED Course / MDM   Clinical Course as of 05/24/24 0704  Thu May 24, 2024  0652 Patient currently receiving continuous neb.  Appears more comfortable and is less tachypneic.  Improved aeration.  Continued wheezing. [CH]    Clinical Course User Index [CH] Horton, Charmaine FALCON, MD    Received care of pt from Dr. Bari. Please see notes for hx, physical and care. COPD, recent admission, presents with dyspnea, tachypnea, wheezing.  Receiving decadron , magnesium , bronchodilators.  CXR without pneumonia.  Has congestion, cough, cold like symptoms with wheezing and suspect COPD exacerbation, low suspicion for PE, ACS, at this time.    Labs without significant findings.   Reevaluated and feels improved however has continued wheezing, dyspnea.  Plan on admission for further care.        Dreama Longs, MD 05/24/24 2104

## 2024-05-24 NOTE — Progress Notes (Deleted)
 Patient name: [name] Date of birth: 04/27/1951 Date of visit: 05/24/24  Type of visit: Established Patient Office Visit   Subjective   Chief concern: No chief complaint on file.   [name] is a 73 y.o. female with a PMHx of COPD, T2DM, HTN, TUD, and history of medication non-adherence who presents to [facility] clinic for hospital follow up.  Of note, [name] was also hospitalized in June for another COPD exacerbation. She was admitted again on July 7th due to non-adherence to her medication regimen.  Follow up Hospitalization  Patient was admitted to [facility] at [facility] on 05/13/24 and discharged on 05/14/24. She was treated for COPD Exacerbation. Treatment for this included IV ? PO steroids. Telephone follow up was done on 05/15/24 She reports fair compliance with treatment. She reports this condition is improved.  ----------------------------------------------------------------------------------------- -    Patient Active Problem List   Diagnosis Date Noted   COPD with acute exacerbation (HCC) 05/14/2024   Hypertensive urgency 04/19/2024   COPD exacerbation (HCC) 04/18/2024   Memory changes 03/28/2023   Age-related osteoporosis without current pathological fracture  03/06/2021   Healthcare maintenance 03/06/2021   Chronic hip pain, right 06/22/2019   Diabetic neuropathy (HCC) 05/21/2019   Type 2 diabetes mellitus with complication, without long-term current use of insulin  (HCC) 12/26/2018   Chronic sinusitis    COPD (chronic obstructive pulmonary disease) (HCC) 12/24/2014   Acute hypoxic respiratory failure (HCC)    Dyslipidemia associated with type 2 diabetes mellitus (HCC) 06/14/2014   Breast nodule 12/01/2012   Primary osteoarthritis of right knee 09/29/2012   Anxiety 12/30/2009   Tobacco use disorder 04/06/2009   Hypertension associated with chronic kidney disease due to type 2 diabetes mellitus (HCC) 04/06/2009     Past Surgical  History:  Procedure Laterality Date   ABDOMINAL HYSTERECTOMY     ANTERIOR CERVICAL DECOMP/DISCECTOMY FUSION  2012   Dr. Beuford  put plate in; did something to my vertebrae   BACK SURGERY     BREAST LUMPECTOMY Right 1991   DOBUTAMINE  STRESS ECHO  08/2004   Inferior ischemia, normal LV systolic function, no significant CAD   MINOR IRRIGATION AND DEBRIDEMENT OF WOUND Right 09/04/2022   Procedure: IRRIGATION AND DEBRIDEMENT OF NASAL WOUND;  Surgeon: Luciano Standing, MD;  Location: [facility] OR;  Service: ENT;  Laterality: Right;    ROS  Current Outpatient Medications  Medication Instructions   acetaminophen  (TYLENOL ) 1,000 mg, Oral, Daily PRN   albuterol  (PROVENTIL ) 2.5 mg, Nebulization, Every 4 hours PRN   albuterol  (VENTOLIN  HFA) 108 (90 Base) MCG/ACT inhaler 2 puffs, Inhalation, Every 6 hours PRN   amLODipine  (NORVASC ) 10 mg, Oral, Daily   fluticasone -salmeterol (ADVAIR ) 100-50 MCG/ACT AEPB 1 puff, Inhalation, 2 times daily   guaiFENesin -dextromethorphan  (ROBITUSSIN DM) 100-10 MG/5ML syrup 10 mLs, Oral, Every 4 hours PRN   ipratropium-albuterol  (DUONEB) 0.5-2.5 (3) MG/3ML SOLN 3 mLs, Nebulization, 4 times daily   losartan -hydrochlorothiazide  (HYZAAR) 50-12.5 MG tablet 1 tablet, Oral, 2 times daily   metoprolol  tartrate (LOPRESSOR ) 50 mg, Oral, 2 times daily   predniSONE  (DELTASONE ) 20 mg, Daily with breakfast    Social History   Tobacco Use   Smoking status: Every Day    Current packs/day: 0.50    Average packs/day: 0.5 packs/day for 45.0 years (22.5 ttl pk-yrs)    Types: Cigarettes   Smokeless tobacco: Never   Tobacco comments:    1/2 pk per day  Vaping Use   Vaping status: Never Used  Substance Use Topics   Alcohol  use: Yes    Alcohol /week: 4.0 standard drinks of alcohol     Types: 4 Cans of beer per week    Comment: 08/16/2018 weekends only   Drug use: No      Objective  There were no vitals filed for this visit.There is no height or weight on file to calculate BMI.    Physical Exam  Last CBC Lab Results  Component Value Date   WBC 6.3 05/24/2024   HGB 12.9 05/24/2024   HCT 40.0 05/24/2024   MCV 90.5 05/24/2024   MCH 29.2 05/24/2024   RDW 14.0 05/24/2024   PLT 351 05/24/2024   Last metabolic panel Lab Results  Component Value Date   GLUCOSE 111 (H) 05/24/2024   NA 138 05/24/2024   K 3.6 05/24/2024   CL 101 05/24/2024   CO2 26 05/24/2024   BUN 8 05/24/2024   CREATININE 0.73 05/24/2024   GFRNONAA >60 05/24/2024   CALCIUM  8.8 (L) 05/24/2024   PHOS 3.6 01/12/2024   PROT 7.4 05/13/2024   ALBUMIN 3.9 05/13/2024   LABGLOB 2.7 06/21/2018   AGRATIO 1.6 06/21/2018   BILITOT 1.2 05/13/2024   ALKPHOS 104 05/13/2024   AST 16 05/13/2024   ALT 10 05/13/2024   ANIONGAP 11 05/24/2024        Assessment & Plan  Problem List Items Addressed This Visit   None    No follow-ups on file.  COPD exacerbation/ chronic copd -prednisone  daily for 5 days -advair  inhaler bid every day -Duonebs every 6 hours every day -Albuterol  nebulizer or inhaler prn  HTN -amlodipine  10 mg daily -losaratan-hydrochlorothiazide  50-12.5mg  bid -metoprolol  tartrate 50mg  bid  MEDICARE PATIENT NEEDS GC  Patient discussed with Dr. {[id number]}, who also saw and evaluated the patient.  Aulden Calise, MD [facility]  PGY-1 05/24/2024, 4:03 PM

## 2024-05-24 NOTE — H&P (Signed)
 History and Physical  [name] FMW:[id number] DOB: 1951-02-12 DOA: 05/24/2024  PCP: Elnora Ip, MD   Chief Complaint: Wheezing, shortness of breath  HPI: [name] is a 73 y.o. female with medical history significant for hypertension, COPD on room air, ongoing tobacco abuse being admitted to the hospital for the third time in 6 weeks for recurrent acute exacerbation of COPD.  She was discharged from [facility] on 7/7 after a short stay for acute exacerbation of COPD, she was treated in the usual fashion with breathing treatments, nebulizers and steroids.  It was found that the patient was unable to afford Trelegy, she was discharged home with Advair .  Patient states that she has been trying to cut back on her smoking, in the last week she has been smoking approximately half a pack of cigarettes per day.  She denies any chest pain, fevers or chills.  States that she has been compliant with her prescribed prednisone  which she took for 5 days.  States that she has been compliant with her Advair , taking it twice daily.  Tells me that she did not feel significantly better when she was discharged home on 7/7, but states that in particular in the last 24 hours she has had worsening shortness of breath, and cough.  Review of Systems: Please see HPI for pertinent positives and negatives. A complete 10 system review of systems are otherwise negative.  Past Medical History:  Diagnosis Date   Acute sinusitis 01/09/2019   Acute sinusitis 01/09/2019   Anxiety    Arthritis    fingers, knees (08/16/2018)   Asthma    Atopic dermatitis 03/20/2018   Axillary hidradenitis suppurativa 10/13/2018   Cancer of right breast (HCC) 1991   s/p lumpectomy, chemotherapy and radiation therapy in 1991. Mammogram in 2007 was normal.   Cellulitis of buttock, left 12/13/2022   Constipated    h/o   COPD (chronic obstructive pulmonary disease) (HCC)    History of multiple hospital admissions for  exercabation    COPD exacerbation (HCC) 01/01/2019   COPD exacerbation (HCC) 02/17/2024   COPD with acute exacerbation (HCC) 01/14/2019   COPD with exacerbation (HCC) 04/06/2009   Qualifier: Diagnosis of  By: Loletta MD, Vijay     Depression    Diarrhea    h/o   Facial cellulitis 08/30/2022   Facial edema 08/31/2022   GERD (gastroesophageal reflux disease)    Grade I diastolic dysfunction 01/12/2024   Headache    a few times/month (08/16/2018   Heart murmur 10/05/2011   first time I ever heard I had one was today   History of breast cancer 12/01/2012   Pt with h/o breast CA s/p lumpectomy with chemo/radiation in 1991. Pt mammogram 2011 was unremarkable. Had CT chest 10/2012 in ED for SOB and showed spiculated nodule with lymph node. 12/04/12: Birads 2; repeat diagnostic mammogram in 1 year.     Hyperlipidemia    Hypertension    Lower extremity edema 09/21/2018   Obesity    Personal history of chemotherapy    Personal history of radiation therapy    Pneumonia    couple times in the last 10-15 yrs (08/16/2018)   QT prolongation 08/08/2014   Seasonal allergies 02/25/2017   Shortness of breath 10/05/2011   at rest; lying down; w/exertion   Sigmoid diverticulitis 80/2008   Tobacco abuse    Type 2 diabetes mellitus (HCC) 05/14/2009   Type II diabetes mellitus (HCC)    Past Surgical History:  Procedure Laterality  Date   ABDOMINAL HYSTERECTOMY     ANTERIOR CERVICAL DECOMP/DISCECTOMY FUSION  2012   Dr. Beuford  put plate in; did something to my vertebrae   BACK SURGERY     BREAST LUMPECTOMY Right 1991   DOBUTAMINE  STRESS ECHO  08/2004   Inferior ischemia, normal LV systolic function, no significant CAD   MINOR IRRIGATION AND DEBRIDEMENT OF WOUND Right 09/04/2022   Procedure: IRRIGATION AND DEBRIDEMENT OF NASAL WOUND;  Surgeon: Luciano Standing, MD;  Location: [facility] OR;  Service: ENT;  Laterality: Right;   Social History:  reports that she has been smoking cigarettes. She has  a 22.5 pack-year smoking history. She has never used smokeless tobacco. She reports current alcohol  use of about 4.0 standard drinks of alcohol  per week. She reports that she does not use drugs.  Allergies  Allergen Reactions   Ace Inhibitors Anaphylaxis, Swelling and Other (See Comments)    Throat swelling  (Re: Lisinopril -hydrochlorothiazide )  Pt questioned if she is allergic in 2025.    Family History  Problem Relation Age of Onset   Cancer Mother        unknown   Breast cancer Daughter 31   Breast cancer Daughter        74s   BRCA 1/2 Neg Hx      Prior to Admission medications   Medication Sig Start Date End Date Taking? Authorizing Provider  acetaminophen  (TYLENOL ) 325 MG tablet Take 2 tablets (650 mg total) by mouth every 6 (six) hours as needed for mild pain (or Fever >/= 101). 08/23/22   Franchot Novel, MD  albuterol  (PROVENTIL ) (2.5 MG/3ML) 0.083% nebulizer solution Take 3 mLs (2.5 mg total) by nebulization every 4 (four) hours as needed for wheezing or shortness of breath. 04/21/24   Elnora Ip, MD  albuterol  (VENTOLIN  HFA) 108 [phone] Base) MCG/ACT inhaler Inhale 2 puffs into the lungs every 6 (six) hours as needed for wheezing or shortness of breath. 01/10/24   Jolaine Pac, DO  amLODipine  (NORVASC ) 10 MG tablet Take 1 tablet (10 mg total) by mouth daily. 03/19/24 03/19/25  Fernand Prost, MD  fluticasone -salmeterol (ADVAIR ) 100-50 MCG/ACT AEPB Inhale 1 puff into the lungs 2 (two) times daily. 05/14/24   Barbarann Nest, MD  guaiFENesin  (MUCINEX ) 600 MG 12 hr tablet Take 1 tablet (600 mg total) by mouth 2 (two) times daily. 04/22/24   Vann, Jessica U, DO  guaiFENesin -dextromethorphan  (ROBITUSSIN DM) 100-10 MG/5ML syrup Take 10 mLs by mouth every 4 (four) hours as needed for cough. 04/22/24   Vann, Jessica U, DO  ipratropium-albuterol  (DUONEB) 0.5-2.5 (3) MG/3ML SOLN Take 3 mLs by nebulization 4 (four) times daily. 05/14/24   Barbarann Nest, MD  losartan -hydrochlorothiazide   (HYZAAR) 50-12.5 MG tablet Take 1 tablet by mouth 2 (two) times daily. 03/20/24   Elnora Ip, MD  metoprolol  tartrate (LOPRESSOR ) 50 MG tablet Take 1 tablet (50 mg total) by mouth 2 (two) times daily. 04/22/24   Vann, Jessica U, DO    Physical Exam: BP (!) 155/92   Pulse 100   Temp 98.3 F (36.8 C) (Oral)   Resp (!) 22   Ht 5' 6 (1.676 m)   Wt 71.2 kg   SpO2 96%   BMI 25.34 kg/m  General:  Alert, oriented, calm, in no acute distress, currently receiving continuous nebulizer.  No cough, able to speak 5 or 6 words at a time, has moderate dyspnea currently. Cardiovascular: RRR, no murmurs or rubs, no peripheral edema  Respiratory: She has equal bilateral air  entry, but breath sounds are diminished globally.  No stridor, but she does have diffuse rhonchi, with mild tachypnea. Abdomen: soft, nontender, nondistended, normal bowel tones heard  Skin: dry, no rashes  Musculoskeletal: no joint effusions, normal range of motion  Psychiatric: appropriate affect, normal speech  Neurologic: extraocular muscles intact, clear speech, moving all extremities with intact sensorium         Labs on Admission:  Basic Metabolic Panel: Recent Labs  Lab 05/24/24 0538  NA 138  K 3.6  CL 101  CO2 26  GLUCOSE 111*  BUN 8  CREATININE 0.73  CALCIUM  8.8*   Liver Function Tests: No results for input(s): AST, ALT, ALKPHOS, BILITOT, PROT, ALBUMIN in the last 168 hours. No results for input(s): LIPASE, AMYLASE in the last 168 hours. No results for input(s): AMMONIA in the last 168 hours. CBC: Recent Labs  Lab 05/24/24 0538  WBC 6.3  NEUTROABS 3.4  HGB 12.9  HCT 40.0  MCV 90.5  PLT 351   Cardiac Enzymes: No results for input(s): CKTOTAL, CKMB, CKMBINDEX, TROPONINI in the last 168 hours. BNP (last 3 results) Recent Labs    01/12/24 0830 02/17/24 1633 04/18/24 1809  BNP 68.9 59.5 108.1*    ProBNP (last 3 results) No results for input(s): PROBNP in  the last 8760 hours.  CBG: No results for input(s): GLUCAP in the last 168 hours.  Radiological Exams on Admission: DG Chest Portable 1 View Result Date: 05/24/2024 CLINICAL DATA:  Wheezing and shortness of breath. EXAM: PORTABLE CHEST 1 VIEW COMPARISON:  05/13/2024 FINDINGS: Heart size and mediastinal contours are normal. Lungs are hyperinflated but clear. No pleural fluid, interstitial edema or airspace consolidation. Surgical clips noted in the right axilla. Status post ACDF in the lower cervical spine. IMPRESSION: 1. No acute cardiopulmonary disease. Electronically Signed   By: Waddell Calk M.D.   On: 05/24/2024 06:39   Assessment/Plan JOANNA BORAWSKI is a 73 y.o. female with medical history significant for hypertension, COPD on room air, ongoing tobacco abuse being admitted to the hospital for the third time in 6 weeks for recurrent acute exacerbation of COPD.   Acute exacerbation of COPD-with increased cough, wheezing, dyspnea with exertion, no significant hypoxia documented.  She has recurrent URI symptoms as well.  She continues to smoke, about half a pack per day and has been trying to cut back. -Inpatient admission -Supplemental oxygen  if needed, with goal O2 saturation greater than 89% -Scheduled DuoNeb, as needed albuterol  -Incentive spirometer and flutter valve -IV Solu-Medrol  -Check viral respiratory panel, droplet precautions in the meantime -Routine pulmonology consultation requested  Hypertension-continue home amlodipine , Hyzaar, Lopressor   Tobacco abuse-ongoing, though patient is cutting back.  I counseled her strongly to quit completely. -Nicotine  patch if requested  DVT prophylaxis: Lovenox      Code Status: Full Code  Consults called: None  Admission status: The appropriate patient status for this patient is INPATIENT. Inpatient status is judged to be reasonable and necessary in order to provide the required intensity of service to ensure the patient's  safety. The patient's presenting symptoms, physical exam findings, and initial radiographic and laboratory data in the context of their chronic comorbidities is felt to place them at high risk for further clinical deterioration. Furthermore, it is not anticipated that the patient will be medically stable for discharge from the hospital within 2 midnights of admission.    I certify that at the point of admission it is my clinical judgment that the patient will require inpatient hospital  care spanning beyond 2 midnights from the point of admission due to high intensity of service, high risk for further deterioration and high frequency of surveillance required  Time spent: 59 minutes  Ardyth Kelso CHRISTELLA Gail MD [facility] Pager [phone]  If 7PM-7AM, please contact night-coverage [email or web address] Password [email or web address]  05/24/2024, 10:44 AM

## 2024-05-25 DIAGNOSIS — I10 Essential (primary) hypertension: Secondary | ICD-10-CM | POA: Diagnosis not present

## 2024-05-25 DIAGNOSIS — J441 Chronic obstructive pulmonary disease with (acute) exacerbation: Secondary | ICD-10-CM

## 2024-05-25 DIAGNOSIS — E1169 Type 2 diabetes mellitus with other specified complication: Secondary | ICD-10-CM | POA: Diagnosis not present

## 2024-05-25 DIAGNOSIS — E118 Type 2 diabetes mellitus with unspecified complications: Secondary | ICD-10-CM

## 2024-05-25 DIAGNOSIS — F17210 Nicotine dependence, cigarettes, uncomplicated: Secondary | ICD-10-CM | POA: Diagnosis not present

## 2024-05-25 DIAGNOSIS — F17200 Nicotine dependence, unspecified, uncomplicated: Secondary | ICD-10-CM

## 2024-05-25 DIAGNOSIS — E785 Hyperlipidemia, unspecified: Secondary | ICD-10-CM

## 2024-05-25 LAB — CBC
HCT: 38.6 % (ref 36.0–46.0)
Hemoglobin: 12.4 g/dL (ref 12.0–15.0)
MCH: 30 pg (ref 26.0–34.0)
MCHC: 32.1 g/dL (ref 30.0–36.0)
MCV: 93.2 fL (ref 80.0–100.0)
Platelets: 336 K/uL (ref 150–400)
RBC: 4.14 MIL/uL (ref 3.87–5.11)
RDW: 14.2 % (ref 11.5–15.5)
WBC: 8.4 K/uL (ref 4.0–10.5)
nRBC: 0 % (ref 0.0–0.2)

## 2024-05-25 LAB — BASIC METABOLIC PANEL WITH GFR
Anion gap: 8 (ref 5–15)
BUN: 13 mg/dL (ref 8–23)
CO2: 29 mmol/L (ref 22–32)
Calcium: 8.9 mg/dL (ref 8.9–10.3)
Chloride: 99 mmol/L (ref 98–111)
Creatinine, Ser: 0.76 mg/dL (ref 0.44–1.00)
GFR, Estimated: >60 mL/min (ref >60)
Glucose, Bld: 132 mg/dL — ABNORMAL HIGH (ref 70–99)
Potassium: 4.1 mmol/L (ref 3.5–5.1)
Sodium: 136 mmol/L (ref 135–145)

## 2024-05-25 MED ORDER — HYDROXYZINE HCL 25 MG PO TABS
25.0000 mg | ORAL_TABLET | Freq: Once | ORAL | Status: AC
  Administered 2024-05-25: 25 mg via ORAL
  Filled 2024-05-25: qty 1

## 2024-05-25 MED ORDER — FLUTICASONE PROPIONATE 50 MCG/ACT NA SUSP
2.0000 | Freq: Every day | NASAL | Status: DC
  Administered 2024-05-25 – 2024-05-29 (×5): 2 via NASAL
  Filled 2024-05-25: qty 16

## 2024-05-25 MED ORDER — SODIUM CHLORIDE 0.9 % IV SOLN
2.0000 g | INTRAVENOUS | Status: DC
  Administered 2024-05-25 – 2024-05-26 (×2): 2 g via INTRAVENOUS
  Filled 2024-05-25 (×2): qty 20

## 2024-05-25 MED ORDER — MELATONIN 5 MG PO TABS
5.0000 mg | ORAL_TABLET | Freq: Once | ORAL | Status: AC
  Administered 2024-05-25: 5 mg via ORAL
  Filled 2024-05-25: qty 1

## 2024-05-25 MED ORDER — PREDNISONE 20 MG PO TABS
40.0000 mg | ORAL_TABLET | Freq: Every day | ORAL | Status: DC
  Administered 2024-05-26 – 2024-05-29 (×4): 40 mg via ORAL
  Filled 2024-05-25 (×4): qty 2

## 2024-05-25 MED ORDER — BUDESONIDE 0.5 MG/2ML IN SUSP
0.5000 mg | Freq: Two times a day (BID) | RESPIRATORY_TRACT | Status: DC
  Administered 2024-05-25 – 2024-05-29 (×9): 0.5 mg via RESPIRATORY_TRACT
  Filled 2024-05-25 (×9): qty 2

## 2024-05-25 NOTE — Progress Notes (Signed)
 NAME:  [name], MRN:  [id number], DOB:  [birth year], LOS: 1 ADMISSION DATE:  05/24/2024, CONSULTATION DATE: 05/24/2024 REFERRING MD: Dr. Zella, CHIEF COMPLAINT: COPD exacerbation  History of Present Illness:  73 year old active smoker (30 pack years) with a history of COPD, chronic bronchitic symptoms and frequent exacerbations.  She has been hospitalized 4 times this year and has frequent ER visits, just discharged 05/14/2024 to complete a short prednisone  taper.  She was last seen in our office in 2020.  Other past medical history significant for obesity, diabetes, hypertension with diastolic dysfunction, GERD, remote breast cancer.  She has been on Trelegy in the past, unable to obtain due to cost, discharge most recently on Advair  which she has apparently been only taking daily.  She still smokes 1/2 pack cigarettes daily.  She is not on home oxygen . Presents now with URI symptoms, nasal congestion, cough minimally productive of sputum, progressive dyspnea.  Also some upper airway raspiness.  She denies any heartburn or GERD symptoms.  There has been no one else sick in the home.  She is concerned that she was sent home too soon on 7/7.     She was admitted with another exacerbation, started on Solu-Medrol , scheduled DuoNeb.  COVID-19, Influenza and RSV are all negative.  Chest x-ray clear.  Pertinent  Medical History   Past Medical History:  Diagnosis Date   Acute sinusitis 01/09/2019   Acute sinusitis 01/09/2019   Anxiety    Arthritis    fingers, knees (08/16/2018)   Asthma    Atopic dermatitis 03/20/2018   Axillary hidradenitis suppurativa 10/13/2018   Cancer of right breast (HCC) 1991   s/p lumpectomy, chemotherapy and radiation therapy in 1991. Mammogram in 2007 was normal.   Cellulitis of buttock, left 12/13/2022   Constipated    h/o   COPD (chronic obstructive pulmonary disease) (HCC)    History of multiple hospital admissions for exercabation    COPD  exacerbation (HCC) 01/01/2019   COPD exacerbation (HCC) 02/17/2024   COPD with acute exacerbation (HCC) 01/14/2019   COPD with exacerbation (HCC) 04/06/2009   Qualifier: Diagnosis of  By: Loletta MD, Vijay     Depression    Diarrhea    h/o   Facial cellulitis 08/30/2022   Facial edema 08/31/2022   GERD (gastroesophageal reflux disease)    Grade I diastolic dysfunction 01/12/2024   Headache    a few times/month (08/16/2018   Heart murmur 10/05/2011   first time I ever heard I had one was today   History of breast cancer 12/01/2012   Pt with h/o breast CA s/p lumpectomy with chemo/radiation in 1991. Pt mammogram 2011 was unremarkable. Had CT chest 10/2012 in ED for SOB and showed spiculated nodule with lymph node. 12/04/12: Birads 2; repeat diagnostic mammogram in 1 year.     Hyperlipidemia    Hypertension    Lower extremity edema 09/21/2018   Obesity    Personal history of chemotherapy    Personal history of radiation therapy    Pneumonia    couple times in the last 10-15 yrs (08/16/2018)   QT prolongation 08/08/2014   Seasonal allergies 02/25/2017   Shortness of breath 10/05/2011   at rest; lying down; w/exertion   Sigmoid diverticulitis 80/2008   Tobacco abuse    Type 2 diabetes mellitus (HCC) 05/14/2009   Type II diabetes mellitus (HCC)    Significant Hospital Events: Including procedures, antibiotic start and stop dates in addition to other pertinent events  Pulmonary function testing 06/23/2015 >> moderately severe obstruction without a bronchodilator response, FEV1 1.48 L (68% predicted), Normal lung volumes, decreased diffusion capacity that corrects to the normal range or adjusted for alveolar volume.  Flow volume loop consistent with obstruction. Echocardiogram 04/19/2024 >> LVEF 75%, hyperdynamic.  Asymmetric left ventricular basal septal hypertrophy with indeterminate diastolic parameters.  Normal RV size and function.  Normal PASP.  Interim History / Subjective:   Feels a little bit better, still wheezing, still feels a little short of breath  Objective    Blood pressure (!) 130/93, pulse (!) 54, temperature 98.5 F (36.9 C), temperature source Oral, resp. rate (!) 22, height 5' 6 (1.676 m), weight 71.2 kg, SpO2 97%.    FiO2 (%):  [21 %] 21 %   Intake/Output Summary (Last 24 hours) at 05/25/2024 1156 Last data filed at 05/25/2024 0856 Gross per 24 hour  Intake 1390 ml  Output --  Net 1390 ml   Filed Weights   05/24/24 0545 05/24/24 0559  Weight: 71.2 kg 71.2 kg    Examination: General: Elderly, does not appear to be in distress HENT: Moist oral mucosa Lungs: Decreased breath sounds, rhonchi bilaterally Cardiovascular: S1-S2 appreciated Abdomen: Full, bowel sounds appreciated Extremities: No clubbing, no edema Neuro: Awake, alert and oriented GU:   I reviewed last 24 h vitals and pain scores, last 48 h intake and output, last 24 h labs and trends, and last 24 h imaging results.  Resolved problem list   Assessment and Plan   COPD with exacerbation Active smoker - Still smoking about a pack a day - Continue bronchodilator treatments  Hypertension Diastolic dysfunction GERD  Continue bronchodilator treatments - May be transition to oral steroids Continue to optimize medications for treatment of hypertension  The need to quit smoking was discussed and she will be working on this  Orders placed for oral steroids to start 7/19

## 2024-05-25 NOTE — Progress Notes (Signed)
   05/25/24 1247  TOC Brief Assessment  Insurance and Status Reviewed  Patient has primary care physician Yes  Home environment has been reviewed Resides in single family home with spouse  Prior level of function: Independent with ADLs  Prior/Current Home Services No current home services  Social Drivers of Health Review SDOH reviewed no interventions necessary  Readmission risk has been reviewed Yes  Transition of care needs no transition of care needs at this time

## 2024-05-25 NOTE — Progress Notes (Signed)
 PROGRESS NOTE    [name]  FMW:[id number] DOB: 1951-02-15 DOA: 05/24/2024 PCP: Elnora Ip, MD   Chief Complaint  Patient presents with   Shortness of Breath    Brief Narrative:  Patient 73 year old female history of hypertension, COPD on room air, ongoing tobacco abuse presented to the ED with an acute COPD exacerbation that she feels was triggered by recent URI.  Patient noted to have been hospitalized for the third time in 6 weeks for recurrent acute exacerbation of COPD.   Assessment & Plan:   Principal Problem:   COPD with acute exacerbation (HCC) Active Problems:   Tobacco use disorder   Dyslipidemia associated with type 2 diabetes mellitus (HCC)   Type 2 diabetes mellitus with complication, without long-term current use of insulin  (HCC)   Essential hypertension  #1 acute COPD exacerbation -Patient presented with increased cough, wheezing, sneezing, dips on exertion with no significant hypoxia documented. - Patient does endorse recurrent URI which she feels likely triggered her acute COPD exacerbation. - Patient with ongoing tobacco use smokes about a pack a day and has been trying to cut back. -Respiratory viral panel negative. -SARS coronavirus 2 PCR negative. -Influenza A and B by PCR negative -RSV by PCR negative. - Continue IV Solu-Medrol  with likely taper to oral prednisone  tomorrow. - Start Pulmicort , Flonase , Claritin , PPI, IV Rocephin , scheduled DuoNebs. - Place on Hycodan as needed. - PCCM/pulmonary following and appreciate their input and recommendations.  2.  Hypertension -Continue home regimen amlodipine , Lopressor , HCTZ, Cozaar .  3.  Tobacco abuse -Tobacco cessation stressed to patient. - Nicotine  patch recommended however patient politely declined as she states has an intolerance to the nicotine  patch.  4.  Hyperlipidemia -Patient not on medications per med rec. - Check a fasting lipid panel.  5.  Well-controlled diabetes  mellitus type 2 -Hemoglobin A1c 5.4. - Likely diet controlled. - Outpatient follow-up with PCP.   DVT prophylaxis: Lovenox  Code Status: Full Family Communication: Updated patient.  No family at bedside. Disposition: Home when clinically improved.  Status is: Inpatient Remains inpatient appropriate because: Severity of illness   Consultants:  PCCM: Dr. Shelah 05/24/2024  Procedures:  Chest x-ray 05/24/2024   Antimicrobials:  Anti-infectives (From admission, onward)    Start     Dose/Rate Route Frequency Ordered Stop   05/25/24 2000  cefTRIAXone  (ROCEPHIN ) 2 g in sodium chloride  0.9 % 100 mL IVPB        2 g 200 mL/hr over 30 Minutes Intravenous Every 24 hours 05/25/24 1825           Subjective: Sitting up by the window eating lunch.  Denies any chest pain.  Feels some improvement with shortness of breath and cough.  Still with some wheezing.  States just saw the pulmonologist.  Feels this episode was triggered by a viral cold as she had upper respiratory tract infection symptoms prior to onset of her symptoms.  Objective: Vitals:   05/25/24 0855 05/25/24 1028 05/25/24 1137 05/25/24 1515  BP:  (!) 130/93 (!) 139/96   Pulse:  (!) 54 60   Resp:   19   Temp:   98 F (36.7 C)   TempSrc:   Oral   SpO2: 99%  97% 97%  Weight:      Height:        Intake/Output Summary (Last 24 hours) at 05/25/2024 1833 Last data filed at 05/25/2024 1528 Gross per 24 hour  Intake 2210 ml  Output --  Net 2210 ml  Filed Weights   05/24/24 0545 05/24/24 0559  Weight: 71.2 kg 71.2 kg    Examination:  General exam: Appears calm and comfortable  Respiratory system: Diffuse coarse breath sounds.  Minimal expiratory wheezing.  No crackles.  Fair air movement.  No use of accessory muscles of respiration.   Cardiovascular system: S1 & S2 heard, RRR. No JVD, murmurs, rubs, gallops or clicks. No pedal edema. Gastrointestinal system: Abdomen is nondistended, soft and nontender. No organomegaly  or masses felt. Normal bowel sounds heard. Central nervous system: Alert and oriented. No focal neurological deficits. Extremities: Symmetric 5 x 5 power. Skin: No rashes, lesions or ulcers Psychiatry: Judgement and insight appear normal. Mood & affect appropriate.     Data Reviewed: I have personally reviewed following labs and imaging studies  CBC: Recent Labs  Lab 05/24/24 0538 05/25/24 0406  WBC 6.3 8.4  NEUTROABS 3.4  --   HGB 12.9 12.4  HCT 40.0 38.6  MCV 90.5 93.2  PLT 351 336    Basic Metabolic Panel: Recent Labs  Lab 05/24/24 0538 05/25/24 0406  NA 138 136  K 3.6 4.1  CL 101 99  CO2 26 29  GLUCOSE 111* 132*  BUN 8 13  CREATININE 0.73 0.76  CALCIUM  8.8* 8.9    GFR: Estimated Creatinine Clearance: 63.4 mL/min (by C-G formula based on SCr of 0.76 mg/dL).  Liver Function Tests: No results for input(s): AST, ALT, ALKPHOS, BILITOT, PROT, ALBUMIN in the last 168 hours.  CBG: No results for input(s): GLUCAP in the last 168 hours.   Recent Results (from the past 240 hours)  Resp panel by RT-PCR (RSV, Flu A&B, Covid) Anterior Nasal Swab     Status: None   Collection Time: 05/24/24 10:59 AM   Specimen: Anterior Nasal Swab  Result Value Ref Range Status   SARS Coronavirus 2 by RT PCR NEGATIVE NEGATIVE Final    Comment: (NOTE) SARS-CoV-2 target nucleic acids are NOT DETECTED.  The SARS-CoV-2 RNA is generally detectable in upper respiratory specimens during the acute phase of infection. The lowest concentration of SARS-CoV-2 viral copies this assay can detect is 138 copies/mL. A negative result does not preclude SARS-Cov-2 infection and should not be used as the sole basis for treatment or other patient management decisions. A negative result may occur with  improper specimen collection/handling, submission of specimen other than nasopharyngeal swab, presence of viral mutation(s) within the areas targeted by this assay, and inadequate number  of viral copies(<138 copies/mL). A negative result must be combined with clinical observations, patient history, and epidemiological information. The expected result is Negative.  Fact Sheet for Patients:  BloggerCourse.com  Fact Sheet for Healthcare Providers:  SeriousBroker.it  This test is no t yet approved or cleared by the [place]  FDA and  has been authorized for detection and/or diagnosis of SARS-CoV-2 by FDA under an Emergency Use Authorization (EUA). This EUA will remain  in effect (meaning this test can be used) for the duration of the COVID-19 declaration under Section 564(b)(1) of the Act, 21 U.S.C.section 360bbb-3(b)(1), unless the authorization is terminated  or revoked sooner.       Influenza A by PCR NEGATIVE NEGATIVE Final   Influenza B by PCR NEGATIVE NEGATIVE Final    Comment: (NOTE) The Xpert Xpress SARS-CoV-2/FLU/RSV plus assay is intended as an aid in the diagnosis of influenza from Nasopharyngeal swab specimens and should not be used as a sole basis for treatment. Nasal washings and aspirates are unacceptable for Xpert Xpress SARS-CoV-2/FLU/RSV  testing.  Fact Sheet for Patients: BloggerCourse.com  Fact Sheet for Healthcare Providers: SeriousBroker.it  This test is not yet approved or cleared by the [place]  FDA and has been authorized for detection and/or diagnosis of SARS-CoV-2 by FDA under an Emergency Use Authorization (EUA). This EUA will remain in effect (meaning this test can be used) for the duration of the COVID-19 declaration under Section 564(b)(1) of the Act, 21 U.S.C. section 360bbb-3(b)(1), unless the authorization is terminated or revoked.     Resp Syncytial Virus by PCR NEGATIVE NEGATIVE Final    Comment: (NOTE) Fact Sheet for Patients: BloggerCourse.com  Fact Sheet for Healthcare  Providers: SeriousBroker.it  This test is not yet approved or cleared by the [place]  FDA and has been authorized for detection and/or diagnosis of SARS-CoV-2 by FDA under an Emergency Use Authorization (EUA). This EUA will remain in effect (meaning this test can be used) for the duration of the COVID-19 declaration under Section 564(b)(1) of the Act, 21 U.S.C. section 360bbb-3(b)(1), unless the authorization is terminated or revoked.  Performed at [facility], [address], [place], [place] [address]   Respiratory (~20 pathogens) panel by PCR     Status: None   Collection Time: 05/24/24 10:59 AM   Specimen: Nasopharyngeal Swab; Respiratory  Result Value Ref Range Status   Adenovirus NOT DETECTED NOT DETECTED Final   Coronavirus 229E NOT DETECTED NOT DETECTED Final    Comment: (NOTE) The Coronavirus on the Respiratory Panel, DOES NOT test for the novel  Coronavirus (2019 nCoV)    Coronavirus HKU1 NOT DETECTED NOT DETECTED Final   Coronavirus NL63 NOT DETECTED NOT DETECTED Final   Coronavirus OC43 NOT DETECTED NOT DETECTED Final   Metapneumovirus NOT DETECTED NOT DETECTED Final   Rhinovirus / Enterovirus NOT DETECTED NOT DETECTED Final   Influenza A NOT DETECTED NOT DETECTED Final   Influenza B NOT DETECTED NOT DETECTED Final   Parainfluenza Virus 1 NOT DETECTED NOT DETECTED Final   Parainfluenza Virus 2 NOT DETECTED NOT DETECTED Final   Parainfluenza Virus 3 NOT DETECTED NOT DETECTED Final   Parainfluenza Virus 4 NOT DETECTED NOT DETECTED Final   Respiratory Syncytial Virus NOT DETECTED NOT DETECTED Final   Bordetella pertussis NOT DETECTED NOT DETECTED Final   Bordetella Parapertussis NOT DETECTED NOT DETECTED Final   Chlamydophila pneumoniae NOT DETECTED NOT DETECTED Final   Mycoplasma pneumoniae NOT DETECTED NOT DETECTED Final    Comment: Performed at [facility], [address] [place], [place] [address]          Radiology Studies: DG Chest Portable 1 View Result Date: 05/24/2024 CLINICAL DATA:  Wheezing and shortness of breath. EXAM: PORTABLE CHEST 1 VIEW COMPARISON:  05/13/2024 FINDINGS: Heart size and mediastinal contours are normal. Lungs are hyperinflated but clear. No pleural fluid, interstitial edema or airspace consolidation. Surgical clips noted in the right axilla. Status post ACDF in the lower cervical spine. IMPRESSION: 1. No acute cardiopulmonary disease. Electronically Signed   By: Waddell Calk M.D.   On: 05/24/2024 06:39        Scheduled Meds:  amLODipine   10 mg Oral Daily   budesonide  (PULMICORT ) nebulizer solution  0.5 mg Nebulization BID   enoxaparin  (LOVENOX ) injection  40 mg Subcutaneous QHS   fluticasone   2 spray Each Nare Daily   losartan   50 mg Oral Daily   And   hydrochlorothiazide   12.5 mg Oral Daily   ipratropium-albuterol   3 mL Nebulization QID   loratadine   10 mg Oral Daily   metoprolol  tartrate  50 mg Oral BID   pantoprazole   40 mg Oral Daily   [START ON 05/26/2024] predniSONE   40 mg Oral Q breakfast   Continuous Infusions:  cefTRIAXone  (ROCEPHIN )  IV       LOS: 1 day    Time spent: 40 minutes    Toribio Hummer, MD [facility]   To contact the attending provider between 7A-7P or the covering provider during after hours 7P-7A, please log into the web site [email or web address] and access using universal [place] password for that web site. If you do not have the password, please call the hospital operator.  05/25/2024, 6:33 PM

## 2024-05-25 NOTE — Plan of Care (Signed)

## 2024-05-26 DIAGNOSIS — F17210 Nicotine dependence, cigarettes, uncomplicated: Secondary | ICD-10-CM | POA: Diagnosis not present

## 2024-05-26 DIAGNOSIS — J441 Chronic obstructive pulmonary disease with (acute) exacerbation: Secondary | ICD-10-CM | POA: Diagnosis not present

## 2024-05-26 DIAGNOSIS — E1169 Type 2 diabetes mellitus with other specified complication: Secondary | ICD-10-CM | POA: Diagnosis not present

## 2024-05-26 DIAGNOSIS — I10 Essential (primary) hypertension: Secondary | ICD-10-CM | POA: Diagnosis not present

## 2024-05-26 DIAGNOSIS — F17200 Nicotine dependence, unspecified, uncomplicated: Secondary | ICD-10-CM | POA: Diagnosis not present

## 2024-05-26 LAB — LIPID PANEL
Cholesterol: 176 mg/dL (ref 0–200)
HDL: 80 mg/dL (ref >40)
LDL Cholesterol: 76 mg/dL (ref 0–99)
Total CHOL/HDL Ratio: 2.2 ratio
Triglycerides: 98 mg/dL (ref <150)
VLDL: 20 mg/dL (ref 0–40)

## 2024-05-26 LAB — BASIC METABOLIC PANEL WITH GFR
Anion gap: 9 (ref 5–15)
BUN: 16 mg/dL (ref 8–23)
CO2: 27 mmol/L (ref 22–32)
Calcium: 8.6 mg/dL — ABNORMAL LOW (ref 8.9–10.3)
Chloride: 100 mmol/L (ref 98–111)
Creatinine, Ser: 0.59 mg/dL (ref 0.44–1.00)
GFR, Estimated: >60 mL/min (ref >60)
Glucose, Bld: 107 mg/dL — ABNORMAL HIGH (ref 70–99)
Potassium: 3.8 mmol/L (ref 3.5–5.1)
Sodium: 136 mmol/L (ref 135–145)

## 2024-05-26 LAB — CBC
HCT: 37.7 % (ref 36.0–46.0)
Hemoglobin: 12.2 g/dL (ref 12.0–15.0)
MCH: 30 pg (ref 26.0–34.0)
MCHC: 32.4 g/dL (ref 30.0–36.0)
MCV: 92.9 fL (ref 80.0–100.0)
Platelets: 341 K/uL (ref 150–400)
RBC: 4.06 MIL/uL (ref 3.87–5.11)
RDW: 14.1 % (ref 11.5–15.5)
WBC: 10.9 K/uL — ABNORMAL HIGH (ref 4.0–10.5)
nRBC: 0 % (ref 0.0–0.2)

## 2024-05-26 MED ORDER — IPRATROPIUM-ALBUTEROL 0.5-2.5 (3) MG/3ML IN SOLN
3.0000 mL | Freq: Three times a day (TID) | RESPIRATORY_TRACT | Status: DC
  Administered 2024-05-26 – 2024-05-28 (×5): 3 mL via RESPIRATORY_TRACT
  Filled 2024-05-26 (×5): qty 3

## 2024-05-26 NOTE — Progress Notes (Signed)
 PROGRESS NOTE    [name]  FMW:[id number] DOB: 06-18-1951 DOA: 05/24/2024 PCP: Elnora Ip, MD   Chief Complaint  Patient presents with   Shortness of Breath    Brief Narrative:  Patient 73 year old female history of hypertension, COPD on room air, ongoing tobacco abuse presented to the ED with an acute COPD exacerbation that she feels was triggered by recent URI.  Patient noted to have been hospitalized for the third time in 6 weeks for recurrent acute exacerbation of COPD.   Assessment & Plan:   Principal Problem:   COPD with acute exacerbation (HCC) Active Problems:   Tobacco use disorder   Dyslipidemia associated with type 2 diabetes mellitus (HCC)   Type 2 diabetes mellitus with complication, without long-term current use of insulin  (HCC)   Essential hypertension  #1 acute COPD exacerbation -Patient presented with increased cough, wheezing, sneezing, dips on exertion with no significant hypoxia documented. - Patient does endorse recurrent URI which she feels likely triggered her acute COPD exacerbation. - Patient with ongoing tobacco use smokes about a pack a day and has been trying to cut back. -Respiratory viral panel negative. -SARS coronavirus 2 PCR negative. -Influenza A and B by PCR negative -RSV by PCR negative. -Improving clinically. - Transition from IV Solu-Medrol  to oral prednisone . - Continue Pulmicort , Flonase , Claritin , PPI, IV Rocephin , scheduled DuoNebs, Hycodan as needed. - PCCM/pulmonary following and appreciate their input and recommendations.  2.  Hypertension - Amlodipine , Lopressor , HCTZ, Cozaar .   3.  Tobacco abuse -Tobacco cessation stressed to patient. - Nicotine  patch recommended however patient politely declined as she states has an intolerance to the nicotine  patch.  4.  Hyperlipidemia -Patient not on medications per med rec. - Fasting lipid panel with LDL of 76.   - Outpatient follow-up with PCP.   5.   Well-controlled diabetes mellitus type 2 -Hemoglobin A1c 5.4. - Likely diet controlled. - Outpatient follow-up with PCP.   DVT prophylaxis: Lovenox  Code Status: Full Family Communication: Updated patient.  No family at bedside. Disposition: Home when clinically improved hopefully next 24 to 48 hours.  Status is: Inpatient Remains inpatient appropriate because: Severity of illness   Consultants:  PCCM: Dr. Shelah 05/24/2024  Procedures:  Chest x-ray 05/24/2024   Antimicrobials:  Anti-infectives (From admission, onward)    Start     Dose/Rate Route Frequency Ordered Stop   05/25/24 2000  cefTRIAXone  (ROCEPHIN ) 2 g in sodium chloride  0.9 % 100 mL IVPB        2 g 200 mL/hr over 30 Minutes Intravenous Every 24 hours 05/25/24 1825           Subjective: Patient lying in bed.  Denies any chest pain, feels shortness of breath and cough have improved.  Overall feels well.   Objective: Vitals:   05/26/24 0602 05/26/24 0755 05/26/24 1009 05/26/24 1209  BP: (!) 140/60  (!) 140/60 (!) 141/63  Pulse: 84  84 66  Resp: 18   19  Temp: 97.9 F (36.6 C)   98.6 F (37 C)  TempSrc: Oral   Oral  SpO2: 100% 99%  100%  Weight:      Height:        Intake/Output Summary (Last 24 hours) at 05/26/2024 1229 Last data filed at 05/26/2024 1100 Gross per 24 hour  Intake 1660 ml  Output --  Net 1660 ml   Filed Weights   05/24/24 0545 05/24/24 0559  Weight: 71.2 kg 71.2 kg    Examination:  General exam:  NAD. Respiratory system: Improving diffuse coarse breath sounds.  Minimal expiratory wheezing.  No crackles.  Fair air movement.  No use of accessory muscles of respiration.  Cardiovascular system: Regular rate rhythm no murmurs rubs or gallops.  No JVD.  No lower extremity edema.  Gastrointestinal system: Abdomen soft, nontender, nondistended, positive bowel sounds.  No rebound.  No guarding.  Central nervous system: Alert and oriented. No focal neurological deficits. Extremities:  Symmetric 5 x 5 power. Skin: No rashes, lesions or ulcers Psychiatry: Judgement and insight appear normal. Mood & affect appropriate.     Data Reviewed: I have personally reviewed following labs and imaging studies  CBC: Recent Labs  Lab 05/24/24 0538 05/25/24 0406 05/26/24 0415  WBC 6.3 8.4 10.9*  NEUTROABS 3.4  --   --   HGB 12.9 12.4 12.2  HCT 40.0 38.6 37.7  MCV 90.5 93.2 92.9  PLT 351 336 341    Basic Metabolic Panel: Recent Labs  Lab 05/24/24 0538 05/25/24 0406 05/26/24 0415  NA 138 136 136  K 3.6 4.1 3.8  CL 101 99 100  CO2 26 29 27   GLUCOSE 111* 132* 107*  BUN 8 13 16   CREATININE 0.73 0.76 0.59  CALCIUM  8.8* 8.9 8.6*    GFR: Estimated Creatinine Clearance: 63.4 mL/min (by C-G formula based on SCr of 0.59 mg/dL).  Liver Function Tests: No results for input(s): AST, ALT, ALKPHOS, BILITOT, PROT, ALBUMIN in the last 168 hours.  CBG: No results for input(s): GLUCAP in the last 168 hours.   Recent Results (from the past 240 hours)  Resp panel by RT-PCR (RSV, Flu A&B, Covid) Anterior Nasal Swab     Status: None   Collection Time: 05/24/24 10:59 AM   Specimen: Anterior Nasal Swab  Result Value Ref Range Status   SARS Coronavirus 2 by RT PCR NEGATIVE NEGATIVE Final    Comment: (NOTE) SARS-CoV-2 target nucleic acids are NOT DETECTED.  The SARS-CoV-2 RNA is generally detectable in upper respiratory specimens during the acute phase of infection. The lowest concentration of SARS-CoV-2 viral copies this assay can detect is 138 copies/mL. A negative result does not preclude SARS-Cov-2 infection and should not be used as the sole basis for treatment or other patient management decisions. A negative result may occur with  improper specimen collection/handling, submission of specimen other than nasopharyngeal swab, presence of viral mutation(s) within the areas targeted by this assay, and inadequate number of viral copies(<138 copies/mL). A  negative result must be combined with clinical observations, patient history, and epidemiological information. The expected result is Negative.  Fact Sheet for Patients:  BloggerCourse.com  Fact Sheet for Healthcare Providers:  SeriousBroker.it  This test is no t yet approved or cleared by the [place]  FDA and  has been authorized for detection and/or diagnosis of SARS-CoV-2 by FDA under an Emergency Use Authorization (EUA). This EUA will remain  in effect (meaning this test can be used) for the duration of the COVID-19 declaration under Section 564(b)(1) of the Act, 21 U.S.C.section 360bbb-3(b)(1), unless the authorization is terminated  or revoked sooner.       Influenza A by PCR NEGATIVE NEGATIVE Final   Influenza B by PCR NEGATIVE NEGATIVE Final    Comment: (NOTE) The Xpert Xpress SARS-CoV-2/FLU/RSV plus assay is intended as an aid in the diagnosis of influenza from Nasopharyngeal swab specimens and should not be used as a sole basis for treatment. Nasal washings and aspirates are unacceptable for Xpert Xpress SARS-CoV-2/FLU/RSV testing.  Fact Sheet  for Patients: BloggerCourse.com  Fact Sheet for Healthcare Providers: SeriousBroker.it  This test is not yet approved or cleared by the [place]  FDA and has been authorized for detection and/or diagnosis of SARS-CoV-2 by FDA under an Emergency Use Authorization (EUA). This EUA will remain in effect (meaning this test can be used) for the duration of the COVID-19 declaration under Section 564(b)(1) of the Act, 21 U.S.C. section 360bbb-3(b)(1), unless the authorization is terminated or revoked.     Resp Syncytial Virus by PCR NEGATIVE NEGATIVE Final    Comment: (NOTE) Fact Sheet for Patients: BloggerCourse.com  Fact Sheet for Healthcare  Providers: SeriousBroker.it  This test is not yet approved or cleared by the [place]  FDA and has been authorized for detection and/or diagnosis of SARS-CoV-2 by FDA under an Emergency Use Authorization (EUA). This EUA will remain in effect (meaning this test can be used) for the duration of the COVID-19 declaration under Section 564(b)(1) of the Act, 21 U.S.C. section 360bbb-3(b)(1), unless the authorization is terminated or revoked.  Performed at [facility], [address] [place], [place] [address]   Respiratory (~20 pathogens) panel by PCR     Status: None   Collection Time: 05/24/24 10:59 AM   Specimen: Nasopharyngeal Swab; Respiratory  Result Value Ref Range Status   Adenovirus NOT DETECTED NOT DETECTED Final   Coronavirus 229E NOT DETECTED NOT DETECTED Final    Comment: (NOTE) The Coronavirus on the Respiratory Panel, DOES NOT test for the novel  Coronavirus (2019 nCoV)    Coronavirus HKU1 NOT DETECTED NOT DETECTED Final   Coronavirus NL63 NOT DETECTED NOT DETECTED Final   Coronavirus OC43 NOT DETECTED NOT DETECTED Final   Metapneumovirus NOT DETECTED NOT DETECTED Final   Rhinovirus / Enterovirus NOT DETECTED NOT DETECTED Final   Influenza A NOT DETECTED NOT DETECTED Final   Influenza B NOT DETECTED NOT DETECTED Final   Parainfluenza Virus 1 NOT DETECTED NOT DETECTED Final   Parainfluenza Virus 2 NOT DETECTED NOT DETECTED Final   Parainfluenza Virus 3 NOT DETECTED NOT DETECTED Final   Parainfluenza Virus 4 NOT DETECTED NOT DETECTED Final   Respiratory Syncytial Virus NOT DETECTED NOT DETECTED Final   Bordetella pertussis NOT DETECTED NOT DETECTED Final   Bordetella Parapertussis NOT DETECTED NOT DETECTED Final   Chlamydophila pneumoniae NOT DETECTED NOT DETECTED Final   Mycoplasma pneumoniae NOT DETECTED NOT DETECTED Final    Comment: Performed at [facility], [address] [place], [place] [address]          Radiology Studies: No results found.       Scheduled Meds:  amLODipine   10 mg Oral Daily   budesonide  (PULMICORT ) nebulizer solution  0.5 mg Nebulization BID   enoxaparin  (LOVENOX ) injection  40 mg Subcutaneous QHS   fluticasone   2 spray Each Nare Daily   losartan   50 mg Oral Daily   And   hydrochlorothiazide   12.5 mg Oral Daily   ipratropium-albuterol   3 mL Nebulization QID   loratadine   10 mg Oral Daily   metoprolol  tartrate  50 mg Oral BID   pantoprazole   40 mg Oral Daily   predniSONE   40 mg Oral Q breakfast   Continuous Infusions:  cefTRIAXone  (ROCEPHIN )  IV Stopped (05/25/24 2130)     LOS: 2 days    Time spent: 35 minutes    Toribio Hummer, MD [facility]   To contact the attending provider between 7A-7P or the covering provider during after hours 7P-7A, please  log into the web site [email or web address] and access using universal [place] password for that web site. If you do not have the password, please call the hospital operator.  05/26/2024, 12:29 PM

## 2024-05-26 NOTE — Plan of Care (Signed)

## 2024-05-26 NOTE — Progress Notes (Signed)
 PULSE OXIMETRY TEST WHILE AMBULATING   This RN assessed the patients' pulse oximetry while ambulating on Room Air. The patient was able to maintain an oxygen  saturation of 98% or greater while ambulating 500 ft. Patient didn't display signs or symptoms of any distress or appear short of breath during the test. The patient also had no complaints of shortness of breath or distress.The patient's heart rate ranged from 90-95 bpm. No supplemental oxygen  was required while ambulating.   Leonor FORBES Clarks, BSN, RN 05/26/24 3:06 PM

## 2024-05-26 NOTE — Progress Notes (Signed)
 NAME:  [name], MRN:  [id number], DOB:  03-07-1951, LOS: 2 ADMISSION DATE:  05/24/2024, CONSULTATION DATE: 05/24/2024 REFERRING MD: Dr. Zella, CHIEF COMPLAINT: COPD exacerbation  History of Present Illness:  73 year old active smoker (30 pack years) with a history of COPD, chronic bronchitic symptoms and frequent exacerbations.  She has been hospitalized 4 times this year and has frequent ER visits, just discharged 05/14/2024 to complete a short prednisone  taper.  She was last seen in our office in 2020.  Other past medical history significant for obesity, diabetes, hypertension with diastolic dysfunction, GERD, remote breast cancer.  She has been on Trelegy in the past, unable to obtain due to cost, discharge most recently on Advair  which she has apparently been only taking daily.  She still smokes 1/2 pack cigarettes daily.  She is not on home oxygen . Presents now with URI symptoms, nasal congestion, cough minimally productive of sputum, progressive dyspnea.  Also some upper airway raspiness.  She denies any heartburn or GERD symptoms.  There has been no one else sick in the home.  She is concerned that she was sent home too soon on 7/7.     She was admitted with another exacerbation, started on Solu-Medrol , scheduled DuoNeb.  COVID-19, Influenza and RSV are all negative.  Chest x-ray clear.  Pertinent  Medical History   Past Medical History:  Diagnosis Date   Acute sinusitis 01/09/2019   Acute sinusitis 01/09/2019   Anxiety    Arthritis    fingers, knees (08/16/2018)   Asthma    Atopic dermatitis 03/20/2018   Axillary hidradenitis suppurativa 10/13/2018   Cancer of right breast (HCC) 1991   s/p lumpectomy, chemotherapy and radiation therapy in 1991. Mammogram in 2007 was normal.   Cellulitis of buttock, left 12/13/2022   Constipated    h/o   COPD (chronic obstructive pulmonary disease) (HCC)    History of multiple hospital admissions for exercabation    COPD  exacerbation (HCC) 01/01/2019   COPD exacerbation (HCC) 02/17/2024   COPD with acute exacerbation (HCC) 01/14/2019   COPD with exacerbation (HCC) 04/06/2009   Qualifier: Diagnosis of  By: Loletta MD, Vijay     Depression    Diarrhea    h/o   Facial cellulitis 08/30/2022   Facial edema 08/31/2022   GERD (gastroesophageal reflux disease)    Grade I diastolic dysfunction 01/12/2024   Headache    a few times/month (08/16/2018   Heart murmur 10/05/2011   first time I ever heard I had one was today   History of breast cancer 12/01/2012   Pt with h/o breast CA s/p lumpectomy with chemo/radiation in 1991. Pt mammogram 2011 was unremarkable. Had CT chest 10/2012 in ED for SOB and showed spiculated nodule with lymph node. 12/04/12: Birads 2; repeat diagnostic mammogram in 1 year.     Hyperlipidemia    Hypertension    Lower extremity edema 09/21/2018   Obesity    Personal history of chemotherapy    Personal history of radiation therapy    Pneumonia    couple times in the last 10-15 yrs (08/16/2018)   QT prolongation 08/08/2014   Seasonal allergies 02/25/2017   Shortness of breath 10/05/2011   at rest; lying down; w/exertion   Sigmoid diverticulitis 80/2008   Tobacco abuse    Type 2 diabetes mellitus (HCC) 05/14/2009   Type II diabetes mellitus (HCC)    Significant Hospital Events: Including procedures, antibiotic start and stop dates in addition to other pertinent events  Pulmonary function testing 06/23/2015 >> moderately severe obstruction without a bronchodilator response, FEV1 1.48 L (68% predicted), Normal lung volumes, decreased diffusion capacity that corrects to the normal range or adjusted for alveolar volume.  Flow volume loop consistent with obstruction. Echocardiogram 04/19/2024 >> LVEF 75%, hyperdynamic.  Asymmetric left ventricular basal septal hypertrophy with indeterminate diastolic parameters.  Normal RV size and function.  Normal PASP.  Interim History / Subjective:   Feels better Was able to ambulate in the hallway today  Objective    Blood pressure (!) 141/63, pulse 66, temperature 98.6 F (37 C), temperature source Oral, resp. rate 19, height 5' 6 (1.676 m), weight 71.2 kg, SpO2 100%.        Intake/Output Summary (Last 24 hours) at 05/26/2024 1437 Last data filed at 05/26/2024 1100 Gross per 24 hour  Intake 1420 ml  Output --  Net 1420 ml   Filed Weights   05/24/24 0545 05/24/24 0559  Weight: 71.2 kg 71.2 kg    Examination: General: Elderly, does not appear to be in distress HENT: Moist oral mucosa Lungs: Occasional rhonchi, air entry improving Cardiovascular: S1-S2 appreciated Abdomen: Full, bowel sounds appreciated Extremities: No clubbing, no edema Neuro: Awake, alert and oriented GU:   I reviewed last 24 h vitals and pain scores, last 48 h intake and output, last 24 h labs and trends, and last 24 h imaging results.  Resolved problem list   Assessment and Plan   COPD with exacerbation Active smoker - Continue bronchodilator treatments - Smoking cessation counseling  Hypertension Diastolic dysfunction GERD - Stable  Changed to oral steroids Continue hypertensive meds  Appears to be trending in the right direction  Pulmonary will sign off

## 2024-05-27 DIAGNOSIS — J441 Chronic obstructive pulmonary disease with (acute) exacerbation: Secondary | ICD-10-CM | POA: Diagnosis not present

## 2024-05-27 DIAGNOSIS — E1169 Type 2 diabetes mellitus with other specified complication: Secondary | ICD-10-CM | POA: Diagnosis not present

## 2024-05-27 DIAGNOSIS — F17200 Nicotine dependence, unspecified, uncomplicated: Secondary | ICD-10-CM | POA: Diagnosis not present

## 2024-05-27 DIAGNOSIS — I10 Essential (primary) hypertension: Secondary | ICD-10-CM | POA: Diagnosis not present

## 2024-05-27 MED ORDER — HYDROXYZINE HCL 10 MG PO TABS
10.0000 mg | ORAL_TABLET | Freq: Once | ORAL | Status: AC | PRN
  Administered 2024-05-27: 10 mg via ORAL
  Filled 2024-05-27: qty 1

## 2024-05-27 MED ORDER — CEFUROXIME AXETIL 500 MG PO TABS
500.0000 mg | ORAL_TABLET | Freq: Two times a day (BID) | ORAL | Status: DC
  Administered 2024-05-27 – 2024-05-29 (×4): 500 mg via ORAL
  Filled 2024-05-27 (×5): qty 1

## 2024-05-27 NOTE — Progress Notes (Signed)
 PROGRESS NOTE    [name]  FMW:[id number] DOB: 17-Jun-1951 DOA: 05/24/2024 PCP: Elnora Ip, MD   Chief Complaint  Patient presents with   Shortness of Breath    Brief Narrative:  Patient 73 year old female history of hypertension, COPD on room air, ongoing tobacco abuse presented to the ED with an acute COPD exacerbation that she feels was triggered by recent URI.  Patient noted to have been hospitalized for the third time in 6 weeks for recurrent acute exacerbation of COPD.   Assessment & Plan:   Principal Problem:   COPD with acute exacerbation (HCC) Active Problems:   Tobacco use disorder   Dyslipidemia associated with type 2 diabetes mellitus (HCC)   Type 2 diabetes mellitus with complication, without long-term current use of insulin  (HCC)   Essential hypertension  #1 acute COPD exacerbation -Patient presented with increased cough, wheezing, sneezing, dips on exertion with no significant hypoxia documented. - Patient does endorse recurrent URI which she feels likely triggered her acute COPD exacerbation. - Patient with ongoing tobacco use smokes about a pack a day and has been trying to cut back. -Respiratory viral panel negative. -SARS coronavirus 2 PCR negative. -Influenza A and B by PCR negative -RSV by PCR negative. - Slowly improving clinically.  -Patient with complaints of wheezing today and some shortness of breath on minimal ambulation. - Was on IV Solu-Medrol  and has been transitioned to oral prednisone .   - Continue Pulmicort , Flonase , Claritin , PPI, scheduled DuoNebs, Hycodan as needed. -DC IV Rocephin  and place on Ceftin  to complete course of antibiotic treatment. - PCCM/pulmonary following and appreciate their input and recommendations.  2.  Hypertension - Continue amlodipine , Lopressor , HCTZ, Cozaar .   3.  Tobacco abuse -Tobacco cessation stressed to patient. - Nicotine  patch recommended however patient politely declined as she  states has an intolerance to the nicotine  patch.  4.  Hyperlipidemia -Patient not on medications per med rec. - Fasting lipid panel with LDL of 76.   - Outpatient follow-up with PCP.   5.  Well-controlled diabetes mellitus type 2 -Hemoglobin A1c 5.4. - Diet controlled.  - Outpatient follow-up with PCP.   DVT prophylaxis: Lovenox  Code Status: Full Family Communication: Updated patient.  No family at bedside. Disposition: Home when clinically improved hopefully next 24 to 48 hours.  Status is: Inpatient Remains inpatient appropriate because: Severity of illness   Consultants:  PCCM: Dr. Shelah 05/24/2024  Procedures:  Chest x-ray 05/24/2024   Antimicrobials:  Anti-infectives (From admission, onward)    Start     Dose/Rate Route Frequency Ordered Stop   05/27/24 1700  cefUROXime  (CEFTIN ) tablet 500 mg        500 mg Oral 2 times daily with meals 05/27/24 0823 05/30/24 1659   05/25/24 2000  cefTRIAXone  (ROCEPHIN ) 2 g in sodium chloride  0.9 % 100 mL IVPB  Status:  Discontinued        2 g 200 mL/hr over 30 Minutes Intravenous Every 24 hours 05/25/24 1825 05/27/24 0823         Subjective: Patient standing up in room wiping table.  States some shortness of breath when ambulating back from the restroom.  Denies any chest pain.  Endorses some wheezing.  Nonproductive cough.   Objective: Vitals:   05/27/24 0605 05/27/24 0844 05/27/24 0847 05/27/24 0928  BP: (!) 167/71   (!) 173/84  Pulse: 66   78  Resp: 16     Temp: 98.3 F (36.8 C)     TempSrc: Oral  SpO2: 100% 98% 100%   Weight:      Height:       No intake or output data in the 24 hours ending 05/27/24 1233  Filed Weights   05/24/24 0545 05/24/24 0559  Weight: 71.2 kg 71.2 kg    Examination:  General exam: NAD. Respiratory system: Patient with some diffuse coarse breath sounds, some expiratory wheezing.  No crackles.  Fair air movement.  No use of accessory muscles of respiration. Cardiovascular system: RRR  no murmurs rubs or gallops.  No JVD.  No pitting lower extremity edema.  Gastrointestinal system: Abdomen is soft, nontender, nondistended, positive bowel sounds.  No rebound.  No guarding.  Central nervous system: Alert and oriented. No focal neurological deficits. Extremities: Symmetric 5 x 5 power. Skin: No rashes, lesions or ulcers Psychiatry: Judgement and insight appear normal. Mood & affect appropriate.     Data Reviewed: I have personally reviewed following labs and imaging studies  CBC: Recent Labs  Lab 05/24/24 0538 05/25/24 0406 05/26/24 0415  WBC 6.3 8.4 10.9*  NEUTROABS 3.4  --   --   HGB 12.9 12.4 12.2  HCT 40.0 38.6 37.7  MCV 90.5 93.2 92.9  PLT 351 336 341    Basic Metabolic Panel: Recent Labs  Lab 05/24/24 0538 05/25/24 0406 05/26/24 0415  NA 138 136 136  K 3.6 4.1 3.8  CL 101 99 100  CO2 26 29 27   GLUCOSE 111* 132* 107*  BUN 8 13 16   CREATININE 0.73 0.76 0.59  CALCIUM  8.8* 8.9 8.6*    GFR: Estimated Creatinine Clearance: 63.4 mL/min (by C-G formula based on SCr of 0.59 mg/dL).  Liver Function Tests: No results for input(s): AST, ALT, ALKPHOS, BILITOT, PROT, ALBUMIN in the last 168 hours.  CBG: No results for input(s): GLUCAP in the last 168 hours.   Recent Results (from the past 240 hours)  Resp panel by RT-PCR (RSV, Flu A&B, Covid) Anterior Nasal Swab     Status: None   Collection Time: 05/24/24 10:59 AM   Specimen: Anterior Nasal Swab  Result Value Ref Range Status   SARS Coronavirus 2 by RT PCR NEGATIVE NEGATIVE Final    Comment: (NOTE) SARS-CoV-2 target nucleic acids are NOT DETECTED.  The SARS-CoV-2 RNA is generally detectable in upper respiratory specimens during the acute phase of infection. The lowest concentration of SARS-CoV-2 viral copies this assay can detect is 138 copies/mL. A negative result does not preclude SARS-Cov-2 infection and should not be used as the sole basis for treatment or other patient  management decisions. A negative result may occur with  improper specimen collection/handling, submission of specimen other than nasopharyngeal swab, presence of viral mutation(s) within the areas targeted by this assay, and inadequate number of viral copies(<138 copies/mL). A negative result must be combined with clinical observations, patient history, and epidemiological information. The expected result is Negative.  Fact Sheet for Patients:  BloggerCourse.com  Fact Sheet for Healthcare Providers:  SeriousBroker.it  This test is no t yet approved or cleared by the [place]  FDA and  has been authorized for detection and/or diagnosis of SARS-CoV-2 by FDA under an Emergency Use Authorization (EUA). This EUA will remain  in effect (meaning this test can be used) for the duration of the COVID-19 declaration under Section 564(b)(1) of the Act, 21 U.S.C.section 360bbb-3(b)(1), unless the authorization is terminated  or revoked sooner.       Influenza A by PCR NEGATIVE NEGATIVE Final   Influenza B by PCR  NEGATIVE NEGATIVE Final    Comment: (NOTE) The Xpert Xpress SARS-CoV-2/FLU/RSV plus assay is intended as an aid in the diagnosis of influenza from Nasopharyngeal swab specimens and should not be used as a sole basis for treatment. Nasal washings and aspirates are unacceptable for Xpert Xpress SARS-CoV-2/FLU/RSV testing.  Fact Sheet for Patients: BloggerCourse.com  Fact Sheet for Healthcare Providers: SeriousBroker.it  This test is not yet approved or cleared by the [place]  FDA and has been authorized for detection and/or diagnosis of SARS-CoV-2 by FDA under an Emergency Use Authorization (EUA). This EUA will remain in effect (meaning this test can be used) for the duration of the COVID-19 declaration under Section 564(b)(1) of the Act, 21 U.S.C. section 360bbb-3(b)(1),  unless the authorization is terminated or revoked.     Resp Syncytial Virus by PCR NEGATIVE NEGATIVE Final    Comment: (NOTE) Fact Sheet for Patients: BloggerCourse.com  Fact Sheet for Healthcare Providers: SeriousBroker.it  This test is not yet approved or cleared by the [place]  FDA and has been authorized for detection and/or diagnosis of SARS-CoV-2 by FDA under an Emergency Use Authorization (EUA). This EUA will remain in effect (meaning this test can be used) for the duration of the COVID-19 declaration under Section 564(b)(1) of the Act, 21 U.S.C. section 360bbb-3(b)(1), unless the authorization is terminated or revoked.  Performed at [facility], [address] [place], [place] [address]   Respiratory (~20 pathogens) panel by PCR     Status: None   Collection Time: 05/24/24 10:59 AM   Specimen: Nasopharyngeal Swab; Respiratory  Result Value Ref Range Status   Adenovirus NOT DETECTED NOT DETECTED Final   Coronavirus 229E NOT DETECTED NOT DETECTED Final    Comment: (NOTE) The Coronavirus on the Respiratory Panel, DOES NOT test for the novel  Coronavirus (2019 nCoV)    Coronavirus HKU1 NOT DETECTED NOT DETECTED Final   Coronavirus NL63 NOT DETECTED NOT DETECTED Final   Coronavirus OC43 NOT DETECTED NOT DETECTED Final   Metapneumovirus NOT DETECTED NOT DETECTED Final   Rhinovirus / Enterovirus NOT DETECTED NOT DETECTED Final   Influenza A NOT DETECTED NOT DETECTED Final   Influenza B NOT DETECTED NOT DETECTED Final   Parainfluenza Virus 1 NOT DETECTED NOT DETECTED Final   Parainfluenza Virus 2 NOT DETECTED NOT DETECTED Final   Parainfluenza Virus 3 NOT DETECTED NOT DETECTED Final   Parainfluenza Virus 4 NOT DETECTED NOT DETECTED Final   Respiratory Syncytial Virus NOT DETECTED NOT DETECTED Final   Bordetella pertussis NOT DETECTED NOT DETECTED Final   Bordetella Parapertussis NOT DETECTED NOT  DETECTED Final   Chlamydophila pneumoniae NOT DETECTED NOT DETECTED Final   Mycoplasma pneumoniae NOT DETECTED NOT DETECTED Final    Comment: Performed at [facility], [address] [place], [place] [address]         Radiology Studies: No results found.       Scheduled Meds:  amLODipine   10 mg Oral Daily   budesonide  (PULMICORT ) nebulizer solution  0.5 mg Nebulization BID   cefUROXime   500 mg Oral BID WC   enoxaparin  (LOVENOX ) injection  40 mg Subcutaneous QHS   fluticasone   2 spray Each Nare Daily   losartan   50 mg Oral Daily   And   hydrochlorothiazide   12.5 mg Oral Daily   ipratropium-albuterol   3 mL Nebulization TID   loratadine   10 mg Oral Daily   metoprolol  tartrate  50 mg Oral BID   pantoprazole   40 mg Oral  Daily   predniSONE   40 mg Oral Q breakfast   Continuous Infusions:     LOS: 3 days    Time spent: 35 minutes    Toribio Hummer, MD [facility]   To contact the attending provider between 7A-7P or the covering provider during after hours 7P-7A, please log into the web site [email or web address] and access using universal [place] password for that web site. If you do not have the password, please call the hospital operator.  05/27/2024, 12:33 PM

## 2024-05-28 ENCOUNTER — Encounter

## 2024-05-28 ENCOUNTER — Other Ambulatory Visit: Payer: Self-pay

## 2024-05-28 ENCOUNTER — Other Ambulatory Visit: Payer: Self-pay | Admitting: Licensed Clinical Social Worker

## 2024-05-28 DIAGNOSIS — F17200 Nicotine dependence, unspecified, uncomplicated: Secondary | ICD-10-CM | POA: Diagnosis not present

## 2024-05-28 DIAGNOSIS — I10 Essential (primary) hypertension: Secondary | ICD-10-CM | POA: Diagnosis not present

## 2024-05-28 DIAGNOSIS — J441 Chronic obstructive pulmonary disease with (acute) exacerbation: Secondary | ICD-10-CM | POA: Diagnosis not present

## 2024-05-28 DIAGNOSIS — E1169 Type 2 diabetes mellitus with other specified complication: Secondary | ICD-10-CM | POA: Diagnosis not present

## 2024-05-28 LAB — CBC WITH DIFFERENTIAL/PLATELET
Abs Immature Granulocytes: 0.21 K/uL — ABNORMAL HIGH (ref 0.00–0.07)
Basophils Absolute: 0 K/uL (ref 0.0–0.1)
Basophils Relative: 0 %
Eosinophils Absolute: 0 K/uL (ref 0.0–0.5)
Eosinophils Relative: 0 %
HCT: 40 % (ref 36.0–46.0)
Hemoglobin: 12.8 g/dL (ref 12.0–15.0)
Immature Granulocytes: 2 %
Lymphocytes Relative: 16 %
Lymphs Abs: 2.3 K/uL (ref 0.7–4.0)
MCH: 30 pg (ref 26.0–34.0)
MCHC: 32 g/dL (ref 30.0–36.0)
MCV: 93.7 fL (ref 80.0–100.0)
Monocytes Absolute: 0.9 K/uL (ref 0.1–1.0)
Monocytes Relative: 7 %
Neutro Abs: 10.7 K/uL — ABNORMAL HIGH (ref 1.7–7.7)
Neutrophils Relative %: 75 %
Platelets: 319 K/uL (ref 150–400)
RBC: 4.27 MIL/uL (ref 3.87–5.11)
RDW: 14.1 % (ref 11.5–15.5)
WBC: 14.2 K/uL — ABNORMAL HIGH (ref 4.0–10.5)
nRBC: 0 % (ref 0.0–0.2)

## 2024-05-28 LAB — BASIC METABOLIC PANEL WITH GFR
Anion gap: 12 (ref 5–15)
BUN: 21 mg/dL (ref 8–23)
CO2: 27 mmol/L (ref 22–32)
Calcium: 8.5 mg/dL — ABNORMAL LOW (ref 8.9–10.3)
Chloride: 95 mmol/L — ABNORMAL LOW (ref 98–111)
Creatinine, Ser: 0.67 mg/dL (ref 0.44–1.00)
GFR, Estimated: >60 mL/min (ref >60)
Glucose, Bld: 144 mg/dL — ABNORMAL HIGH (ref 70–99)
Potassium: 3.9 mmol/L (ref 3.5–5.1)
Sodium: 134 mmol/L — ABNORMAL LOW (ref 135–145)

## 2024-05-28 LAB — MAGNESIUM: Magnesium: 2.1 mg/dL (ref 1.7–2.4)

## 2024-05-28 MED ORDER — HYDROCODONE BIT-HOMATROP MBR 5-1.5 MG/5ML PO SOLN
5.0000 mL | Freq: Four times a day (QID) | ORAL | Status: DC | PRN
  Administered 2024-05-28: 5 mL via ORAL
  Filled 2024-05-28: qty 5

## 2024-05-28 MED ORDER — METHYLPREDNISOLONE SODIUM SUCC 40 MG IJ SOLR
40.0000 mg | Freq: Once | INTRAMUSCULAR | Status: AC
  Administered 2024-05-28: 40 mg via INTRAVENOUS
  Filled 2024-05-28: qty 1

## 2024-05-28 MED ORDER — IPRATROPIUM-ALBUTEROL 0.5-2.5 (3) MG/3ML IN SOLN: 3.0000 mL | Freq: Two times a day (BID) | RESPIRATORY_TRACT | Status: DC

## 2024-05-28 MED ORDER — BENZONATATE 100 MG PO CAPS
200.0000 mg | ORAL_CAPSULE | Freq: Three times a day (TID) | ORAL | Status: DC
  Administered 2024-05-28 – 2024-05-29 (×3): 200 mg via ORAL
  Filled 2024-05-28 (×3): qty 2

## 2024-05-28 MED ORDER — HYDROCORTISONE (PERIANAL) 2.5 % EX CREA
1.0000 | TOPICAL_CREAM | Freq: Four times a day (QID) | CUTANEOUS | Status: DC | PRN
  Filled 2024-05-28: qty 28.35

## 2024-05-28 MED ORDER — IPRATROPIUM-ALBUTEROL 0.5-2.5 (3) MG/3ML IN SOLN
3.0000 mL | Freq: Four times a day (QID) | RESPIRATORY_TRACT | Status: DC
  Administered 2024-05-28 – 2024-05-29 (×4): 3 mL via RESPIRATORY_TRACT
  Filled 2024-05-28 (×4): qty 3

## 2024-05-28 NOTE — Progress Notes (Addendum)
 PROGRESS NOTE    [name]  FMW:[id number] DOB: 1951/11/08 DOA: 05/24/2024 PCP: Elnora Ip, MD   Chief Complaint  Patient presents with   Shortness of Breath    Brief Narrative:  Patient 73 year old female history of hypertension, COPD on room air, ongoing tobacco abuse presented to the ED with an acute COPD exacerbation that she feels was triggered by recent URI.  Patient noted to have been hospitalized for the third time in 6 weeks for recurrent acute exacerbation of COPD.   Assessment & Plan:   Principal Problem:   COPD with acute exacerbation (HCC) Active Problems:   Tobacco use disorder   Dyslipidemia associated with type 2 diabetes mellitus (HCC)   Type 2 diabetes mellitus with complication, without long-term current use of insulin  (HCC)   Essential hypertension  #1 acute COPD exacerbation -Patient presented with increased cough, wheezing, sneezing, dips on exertion with no significant hypoxia documented. - Patient does endorse recurrent URI which she feels likely triggered her acute COPD exacerbation. - Patient with ongoing tobacco use smokes about a pack a day and has been trying to cut back. -Respiratory viral panel negative. -SARS coronavirus 2 PCR negative. -Influenza A and B by PCR negative -RSV by PCR negative. - Slowly improving clinically.  -Patient with complaints of wheezing today and some shortness of breath on minimal ambulation. - Was on IV Solu-Medrol  and has been transitioned to oral prednisone  taper.   -Will give a dose of IV Solu-Medrol  40 mg x 1. - Continue Pulmicort , Flonase , Claritin , PPI, scheduled DuoNebs, Hycodan as needed. -Tessalon  Perles 200 mg 3 times daily. - IV Rocephin  has been transitioned to Ceftin  to complete course of antibiotic treatment.  - PCCM/pulmonary was following but have signed off as of 05/26/2024.   - Outpatient follow-up with PCP and pulmonary.  2.  Hypertension - Continue Lopressor , HCTZ, Cozaar ,  amlodipine .   3.  Tobacco abuse -Tobacco cessation stressed to patient. - Nicotine  patch recommended however patient politely declined as she states has an intolerance to the nicotine  patch.  4.  Hyperlipidemia -Patient not on medications per med rec. - Fasting lipid panel with LDL of 76.   - Outpatient follow-up with PCP.   5.  Well-controlled diabetes mellitus type 2 -Hemoglobin A1c 5.4. - Diet controlled.  - Outpatient follow-up with PCP.   DVT prophylaxis: Lovenox  Code Status: Full Family Communication: Updated patient.  No family at bedside. Disposition: Home when clinically improved hopefully next 24 hours.  Status is: Inpatient Remains inpatient appropriate because: Severity of illness   Consultants:  PCCM: Dr. Shelah 05/24/2024  Procedures:  Chest x-ray 05/24/2024   Antimicrobials:  Anti-infectives (From admission, onward)    Start     Dose/Rate Route Frequency Ordered Stop   05/27/24 1700  cefUROXime  (CEFTIN ) tablet 500 mg        500 mg Oral 2 times daily with meals 05/27/24 0823 05/30/24 1659   05/25/24 2000  cefTRIAXone  (ROCEPHIN ) 2 g in sodium chloride  0.9 % 100 mL IVPB  Status:  Discontinued        2 g 200 mL/hr over 30 Minutes Intravenous Every 24 hours 05/25/24 1825 05/27/24 0823         Subjective: Patient sitting up at the side of the bed coughing.  States does not feel too well today.  Patient with complaints of some diffuse wheezing as well.    Objective: Vitals:   05/28/24 0614 05/28/24 0812 05/28/24 0817 05/28/24 0912  BP: (!) 151/64   ROLLEN)  162/60  Pulse: 80   90  Resp: 16   18  Temp: 98.4 F (36.9 C)     TempSrc: Oral     SpO2: 98% 100% 100%   Weight:      Height:       No intake or output data in the 24 hours ending 05/28/24 1207  Filed Weights   05/24/24 0545 05/24/24 0559  Weight: 71.2 kg 71.2 kg    Examination:  General exam: NAD. Respiratory system: Decreased coarse breath sounds.  Some expiratory wheezing.  No crackles.   Fair air movement.  No use of accessory muscles of respiration. Cardiovascular system: Regular rate rhythm no murmurs rubs or gallops.  No JVD.  No pitting lower extremity edema.  Gastrointestinal system: Abdomen is soft, nontender, nondistended, positive bowel sounds.  No rebound.  No guarding.  Central nervous system: Alert and oriented. No focal neurological deficits. Extremities: Symmetric 5 x 5 power. Skin: No rashes, lesions or ulcers Psychiatry: Judgement and insight appear normal. Mood & affect appropriate.     Data Reviewed: I have personally reviewed following labs and imaging studies  CBC: Recent Labs  Lab 05/24/24 0538 05/25/24 0406 05/26/24 0415 05/28/24 0409  WBC 6.3 8.4 10.9* 14.2*  NEUTROABS 3.4  --   --  10.7*  HGB 12.9 12.4 12.2 12.8  HCT 40.0 38.6 37.7 40.0  MCV 90.5 93.2 92.9 93.7  PLT 351 336 341 319    Basic Metabolic Panel: Recent Labs  Lab 05/24/24 0538 05/25/24 0406 05/26/24 0415 05/28/24 0409  NA 138 136 136 134*  K 3.6 4.1 3.8 3.9  CL 101 99 100 95*  CO2 26 29 27 27   GLUCOSE 111* 132* 107* 144*  BUN 8 13 16 21   CREATININE 0.73 0.76 0.59 0.67  CALCIUM  8.8* 8.9 8.6* 8.5*  MG  --   --   --  2.1    GFR: Estimated Creatinine Clearance: 63.4 mL/min (by C-G formula based on SCr of 0.67 mg/dL).  Liver Function Tests: No results for input(s): AST, ALT, ALKPHOS, BILITOT, PROT, ALBUMIN in the last 168 hours.  CBG: No results for input(s): GLUCAP in the last 168 hours.   Recent Results (from the past 240 hours)  Resp panel by RT-PCR (RSV, Flu A&B, Covid) Anterior Nasal Swab     Status: None   Collection Time: 05/24/24 10:59 AM   Specimen: Anterior Nasal Swab  Result Value Ref Range Status   SARS Coronavirus 2 by RT PCR NEGATIVE NEGATIVE Final    Comment: (NOTE) SARS-CoV-2 target nucleic acids are NOT DETECTED.  The SARS-CoV-2 RNA is generally detectable in upper respiratory specimens during the acute phase of infection. The  lowest concentration of SARS-CoV-2 viral copies this assay can detect is 138 copies/mL. A negative result does not preclude SARS-Cov-2 infection and should not be used as the sole basis for treatment or other patient management decisions. A negative result may occur with  improper specimen collection/handling, submission of specimen other than nasopharyngeal swab, presence of viral mutation(s) within the areas targeted by this assay, and inadequate number of viral copies(<138 copies/mL). A negative result must be combined with clinical observations, patient history, and epidemiological information. The expected result is Negative.  Fact Sheet for Patients:  BloggerCourse.com  Fact Sheet for Healthcare Providers:  SeriousBroker.it  This test is no t yet approved or cleared by the [place]  FDA and  has been authorized for detection and/or diagnosis of SARS-CoV-2 by FDA under an Emergency Use  Authorization (EUA). This EUA will remain  in effect (meaning this test can be used) for the duration of the COVID-19 declaration under Section 564(b)(1) of the Act, 21 U.S.C.section 360bbb-3(b)(1), unless the authorization is terminated  or revoked sooner.       Influenza A by PCR NEGATIVE NEGATIVE Final   Influenza B by PCR NEGATIVE NEGATIVE Final    Comment: (NOTE) The Xpert Xpress SARS-CoV-2/FLU/RSV plus assay is intended as an aid in the diagnosis of influenza from Nasopharyngeal swab specimens and should not be used as a sole basis for treatment. Nasal washings and aspirates are unacceptable for Xpert Xpress SARS-CoV-2/FLU/RSV testing.  Fact Sheet for Patients: BloggerCourse.com  Fact Sheet for Healthcare Providers: SeriousBroker.it  This test is not yet approved or cleared by the [place]  FDA and has been authorized for detection and/or diagnosis of SARS-CoV-2 by FDA under  an Emergency Use Authorization (EUA). This EUA will remain in effect (meaning this test can be used) for the duration of the COVID-19 declaration under Section 564(b)(1) of the Act, 21 U.S.C. section 360bbb-3(b)(1), unless the authorization is terminated or revoked.     Resp Syncytial Virus by PCR NEGATIVE NEGATIVE Final    Comment: (NOTE) Fact Sheet for Patients: BloggerCourse.com  Fact Sheet for Healthcare Providers: SeriousBroker.it  This test is not yet approved or cleared by the [place]  FDA and has been authorized for detection and/or diagnosis of SARS-CoV-2 by FDA under an Emergency Use Authorization (EUA). This EUA will remain in effect (meaning this test can be used) for the duration of the COVID-19 declaration under Section 564(b)(1) of the Act, 21 U.S.C. section 360bbb-3(b)(1), unless the authorization is terminated or revoked.  Performed at [facility], [address] [place], [place] [address]   Respiratory (~20 pathogens) panel by PCR     Status: None   Collection Time: 05/24/24 10:59 AM   Specimen: Nasopharyngeal Swab; Respiratory  Result Value Ref Range Status   Adenovirus NOT DETECTED NOT DETECTED Final   Coronavirus 229E NOT DETECTED NOT DETECTED Final    Comment: (NOTE) The Coronavirus on the Respiratory Panel, DOES NOT test for the novel  Coronavirus (2019 nCoV)    Coronavirus HKU1 NOT DETECTED NOT DETECTED Final   Coronavirus NL63 NOT DETECTED NOT DETECTED Final   Coronavirus OC43 NOT DETECTED NOT DETECTED Final   Metapneumovirus NOT DETECTED NOT DETECTED Final   Rhinovirus / Enterovirus NOT DETECTED NOT DETECTED Final   Influenza A NOT DETECTED NOT DETECTED Final   Influenza B NOT DETECTED NOT DETECTED Final   Parainfluenza Virus 1 NOT DETECTED NOT DETECTED Final   Parainfluenza Virus 2 NOT DETECTED NOT DETECTED Final   Parainfluenza Virus 3 NOT DETECTED NOT DETECTED Final    Parainfluenza Virus 4 NOT DETECTED NOT DETECTED Final   Respiratory Syncytial Virus NOT DETECTED NOT DETECTED Final   Bordetella pertussis NOT DETECTED NOT DETECTED Final   Bordetella Parapertussis NOT DETECTED NOT DETECTED Final   Chlamydophila pneumoniae NOT DETECTED NOT DETECTED Final   Mycoplasma pneumoniae NOT DETECTED NOT DETECTED Final    Comment: Performed at [facility], [address] [place], [place] [address]         Radiology Studies: No results found.       Scheduled Meds:  amLODipine   10 mg Oral Daily   budesonide  (PULMICORT ) nebulizer solution  0.5 mg Nebulization BID   cefUROXime   500 mg Oral BID WC   enoxaparin  (LOVENOX ) injection  40 mg Subcutaneous QHS  fluticasone   2 spray Each Nare Daily   losartan   50 mg Oral Daily   And   hydrochlorothiazide   12.5 mg Oral Daily   ipratropium-albuterol   3 mL Nebulization BID   loratadine   10 mg Oral Daily   metoprolol  tartrate  50 mg Oral BID   pantoprazole   40 mg Oral Daily   predniSONE   40 mg Oral Q breakfast   Continuous Infusions:     LOS: 4 days    Time spent: 35 minutes    Toribio Hummer, MD [facility]   To contact the attending provider between 7A-7P or the covering provider during after hours 7P-7A, please log into the web site [email or web address] and access using universal [place] password for that web site. If you do not have the password, please call the hospital operator.  05/28/2024, 12:07 PM

## 2024-05-28 NOTE — Patient Instructions (Signed)
 Visit Information  Thank you for taking time to visit with me today. Please don't hesitate to contact me if I can be of assistance to you before our next scheduled appointment.  Care Guide to call client or her spouse to schedule LCSW phone visit with client once client has discharged from the hospital    Please call the care guide team at [phone] if you need to cancel or reschedule your appointment.   Following is a copy of your care plan:   Goals Addressed             This Visit's Progress    VBCI Social Work Care Plan       Problems:  Client currently in the hospital receiving care COPD; breathing challenges Challenges in Walking Transport needs; needs help with daily ADLs  CSW Clinical Goal(s):   Over the next 30 days the Patient will attend all scheduled medical appointments as evidenced by patient report and care team review of appointment completion in electronic medical record.             Over next 30 days patient with complete ADLs and activities of choice during days AEB patient report of same  Interventions:  Spoke with client about current needs and status             Discussed discharge plans. Client said she hopes to discharge home soon from the hospital. She resides at home with spouse, Xanthe Couillard             Discussed PT support for client in the hospital             Discussed breathing needs of client             Informed client that Care Guide would call her or her spouse, Victory, to set up another phone visit of LCSW and client. Client agreed to plan             Encouraged client to call LCSW as needed for SW support  Patient Goals/Self-Care Activities:  Follow through with Discharge planning from the hospital   Plan:   Care Guide to call client or her spouse to schedule LCSW phone visit with client once client has discharged from the hospital          Please go to [facility] [address], [place]  [phone]) if you are experiencing a Mental Health or Behavioral Health Crisis or need someone to talk to.  The patient verbalized understanding of instructions, educational materials, and care plan provided today and DECLINED offer to receive copy of patient instructions, educational materials, and care plan.    Glendia Pear  MSW, LCSW [place] Licensed Clinical Social Worker Direct Dial:  [phone] Fax:  [phone] Website:  [email or web address]

## 2024-05-28 NOTE — Progress Notes (Signed)
 C/O of hemorrhoids Anusol  cream ordered. SRP, RN

## 2024-05-28 NOTE — Plan of Care (Signed)

## 2024-05-28 NOTE — Patient Outreach (Signed)
 Complex Care Management   Visit Note  05/28/2024  Name:  [name] MRN: [id number] DOB: 1951/03/12  Situation: Referral received for Complex Care Management related to management of ADLs and daily medical needs I obtained verbal consent from Patient.  Visit completed with patient  on the phone  Background:   Past Medical History:  Diagnosis Date   Acute sinusitis 01/09/2019   Acute sinusitis 01/09/2019   Anxiety    Arthritis    fingers, knees (08/16/2018)   Asthma    Atopic dermatitis 03/20/2018   Axillary hidradenitis suppurativa 10/13/2018   Cancer of right breast (HCC) 1991   s/p lumpectomy, chemotherapy and radiation therapy in 1991. Mammogram in 2007 was normal.   Cellulitis of buttock, left 12/13/2022   Constipated    h/o   COPD (chronic obstructive pulmonary disease) (HCC)    History of multiple hospital admissions for exercabation    COPD exacerbation (HCC) 01/01/2019   COPD exacerbation (HCC) 02/17/2024   COPD with acute exacerbation (HCC) 01/14/2019   COPD with exacerbation (HCC) 04/06/2009   Qualifier: Diagnosis of  By: Loletta MD, Vijay     Depression    Diarrhea    h/o   Facial cellulitis 08/30/2022   Facial edema 08/31/2022   GERD (gastroesophageal reflux disease)    Grade I diastolic dysfunction 01/12/2024   Headache    a few times/month (08/16/2018   Heart murmur 10/05/2011   first time I ever heard I had one was today   History of breast cancer 12/01/2012   Pt with h/o breast CA s/p lumpectomy with chemo/radiation in 1991. Pt mammogram 2011 was unremarkable. Had CT chest 10/2012 in ED for SOB and showed spiculated nodule with lymph node. 12/04/12: Birads 2; repeat diagnostic mammogram in 1 year.     Hyperlipidemia    Hypertension    Lower extremity edema 09/21/2018   Obesity    Personal history of chemotherapy    Personal history of radiation therapy    Pneumonia    couple times in the last 10-15 yrs (08/16/2018)   QT prolongation  08/08/2014   Seasonal allergies 02/25/2017   Shortness of breath 10/05/2011   at rest; lying down; w/exertion   Sigmoid diverticulitis 80/2008   Tobacco abuse    Type 2 diabetes mellitus (HCC) 05/14/2009   Type II diabetes mellitus (HCC)     Assessment: Patient Reported Symptoms:  Cognitive Cognitive Status: Alert and oriented to person, place, and time Cognitive/Intellectual Conditions Management [RPT]: None reported or documented in medical history or problem list   Health Maintenance Behaviors: Sleep adequate, Stress management Health Facilitated by: Stress management  Neurological Neurological Review of Symptoms: No symptoms reported Neurological Management Strategies: Adequate rest, Coping strategies  HEENT HEENT Symptoms Reported: No symptoms reported HEENT Management Strategies: Coping strategies, Activity, Adequate rest    Cardiovascular Cardiovascular Symptoms Reported: Fatigue Cardiovascular Management Strategies: Routine screening, Medication therapy  Respiratory Respiratory Symptoms Reported: Productive cough, Shortness of breath Respiratory Management Strategies: Medication therapy, Activity, Routine screening  Endocrine Endocrine Symptoms Reported: Weakness or fatigue Is patient diabetic?: Yes Is patient checking blood sugars at home?: No    Gastrointestinal Gastrointestinal Symptoms Reported: No symptoms reported Gastrointestinal Management Strategies: Adequate rest, Activity, Coping strategies    Genitourinary Genitourinary Symptoms Reported: No symptoms reported Genitourinary Management Strategies: Activity, Adequate rest, Coping strategies  Integumentary Integumentary Symptoms Reported: No symptoms reported Skin Management Strategies: Coping strategies, Adequate rest  Musculoskeletal Musculoskelatal Symptoms Reviewed: Unsteady gait, Weakness Additional Musculoskeletal Details: walking challenges  Musculoskeletal Management Strategies: Adequate rest, Coping  strategies      Psychosocial Psychosocial Symptoms Reported: Depression - if selected complete PHQ 2-9, Sadness - if selected complete PHQ 2-9 Behavioral Management Strategies: Coping strategies, Adequate rest, Community resources, Medication therapy Major Change/Loss/Stressor/Fears (CP): Medical condition, self Techniques to Cope with Loss/Stress/Change: Counseling, Diversional activities, Medication Quality of Family Relationships: supportive Do you feel physically threatened by others?: No      05/28/2024    4:21 PM  Depression screen PHQ 2/9  Decreased Interest 1  Down, Depressed, Hopeless 1  PHQ - 2 Score 2  Altered sleeping 1  Tired, decreased energy 1  Change in appetite 0  Feeling bad or failure about yourself  1  Trouble concentrating 1  Moving slowly or fidgety/restless 1  Suicidal thoughts 0  PHQ-9 Score 7  Difficult doing work/chores Somewhat difficult    There were no vitals filed for this visit.  Medications Reviewed Today   Medications were not reviewed in this encounter     Recommendation:   PCP Follow-up as needed Follow through with Hospital Discharge Planning for client  Follow Up Plan:   Care Guide to call client or her spouse to schedule LCSW phone visit with client once client is discharged from hospital    Glendia Pear  MSW, LCSW [place] Licensed Clinical Social Worker Direct Dial:  [phone] Fax:  [phone] Website:  [email or web address]

## 2024-05-29 DIAGNOSIS — E118 Type 2 diabetes mellitus with unspecified complications: Secondary | ICD-10-CM | POA: Diagnosis not present

## 2024-05-29 DIAGNOSIS — J441 Chronic obstructive pulmonary disease with (acute) exacerbation: Secondary | ICD-10-CM | POA: Diagnosis not present

## 2024-05-29 DIAGNOSIS — E1169 Type 2 diabetes mellitus with other specified complication: Secondary | ICD-10-CM | POA: Diagnosis not present

## 2024-05-29 DIAGNOSIS — F17200 Nicotine dependence, unspecified, uncomplicated: Secondary | ICD-10-CM | POA: Diagnosis not present

## 2024-05-29 LAB — BASIC METABOLIC PANEL WITH GFR
Anion gap: 8 (ref 5–15)
BUN: 25 mg/dL — ABNORMAL HIGH (ref 8–23)
CO2: 30 mmol/L (ref 22–32)
Calcium: 8.3 mg/dL — ABNORMAL LOW (ref 8.9–10.3)
Chloride: 92 mmol/L — ABNORMAL LOW (ref 98–111)
Creatinine, Ser: 0.72 mg/dL (ref 0.44–1.00)
GFR, Estimated: >60 mL/min (ref >60)
Glucose, Bld: 212 mg/dL — ABNORMAL HIGH (ref 70–99)
Potassium: 4.3 mmol/L (ref 3.5–5.1)
Sodium: 130 mmol/L — ABNORMAL LOW (ref 135–145)

## 2024-05-29 MED ORDER — CEFUROXIME AXETIL 500 MG PO TABS: 500.0000 mg | ORAL_TABLET | Freq: Two times a day (BID) | ORAL | 0 refills | Status: AC

## 2024-05-29 MED ORDER — BENZONATATE 200 MG PO CAPS: 200.0000 mg | ORAL_CAPSULE | Freq: Three times a day (TID) | ORAL | 0 refills | Status: AC

## 2024-05-29 MED ORDER — LORATADINE 10 MG PO TABS: 10.0000 mg | ORAL_TABLET | Freq: Every day | ORAL | 0 refills | Status: DC

## 2024-05-29 MED ORDER — HYDROCORTISONE (PERIANAL) 2.5 % EX CREA: 1.0000 | TOPICAL_CREAM | Freq: Four times a day (QID) | CUTANEOUS | Status: DC | PRN

## 2024-05-29 MED ORDER — FLUTICASONE PROPIONATE 50 MCG/ACT NA SUSP: 2.0000 | Freq: Every day | NASAL | 0 refills | Status: AC

## 2024-05-29 MED ORDER — PANTOPRAZOLE SODIUM 40 MG PO TBEC: 40.0000 mg | DELAYED_RELEASE_TABLET | Freq: Every day | ORAL | 1 refills | Status: DC

## 2024-05-29 MED ORDER — IPRATROPIUM-ALBUTEROL 0.5-2.5 (3) MG/3ML IN SOLN: RESPIRATORY_TRACT | 0 refills | Status: DC

## 2024-05-29 MED ORDER — PREDNISONE 20 MG PO TABS: ORAL_TABLET | ORAL | 0 refills | Status: AC

## 2024-05-29 MED ORDER — SODIUM CHLORIDE 0.9 % IV BOLUS
1000.0000 mL | Freq: Once | INTRAVENOUS | Status: AC
  Administered 2024-05-29: 1000 mL via INTRAVENOUS

## 2024-05-29 NOTE — Discharge Summary (Signed)
 Physician Discharge Summary  [name] FMW:[id number] DOB: 03/28/1951 DOA: 05/24/2024  PCP: Elnora Ip, MD  Admit date: 05/24/2024 Discharge date: 05/29/2024  Time spent: 60 minutes  Recommendations for Outpatient Follow-up:  Follow-up with Elnora Ip, MD in 2 weeks.  On follow-up patient's COPD will need to be reassessed.  Patient's blood pressure will need to be followed up upon. Follow-up with [place] [facility] in 3 weeks for follow-up on patient's COPD.    Discharge Diagnoses:  Principal Problem:   COPD with acute exacerbation (HCC) Active Problems:   Tobacco use disorder   Dyslipidemia associated with type 2 diabetes mellitus (HCC)   Type 2 diabetes mellitus with complication, without long-term current use of insulin  (HCC)   Essential hypertension   Discharge Condition: Stable and improved.  Diet recommendation: Heart healthy  Filed Weights   05/24/24 0545 05/24/24 0559  Weight: 71.2 kg 71.2 kg    History of present illness:  HPI per Dr. Zella Barnie [name] is a 73 y.o. female with medical history significant for hypertension, COPD on room air, ongoing tobacco abuse being admitted to the hospital for the third time in 6 weeks for recurrent acute exacerbation of COPD.  She was discharged from [facility] on [facility]/7 after a short stay for acute exacerbation of COPD, she was treated in the usual fashion with breathing treatments, nebulizers and steroids.  It was found that the patient was unable to afford Trelegy, she was discharged home with Advair .  Patient states that she has been trying to cut back on her smoking, in the last week she has been smoking approximately half a pack of cigarettes per day.  She denies any chest pain, fevers or chills.  States that she has been compliant with her prescribed prednisone  which she took for 5 days.  States that she has been compliant with her Advair , taking it twice daily.  Tells me that she did not feel  significantly better when she was discharged home on 7/7, but states that in particular in the last 24 hours she has had worsening shortness of breath, and cough.   Hospital Course:  #1 acute COPD exacerbation -Patient presented with increased cough, wheezing, sneezing, dips on exertion with no significant hypoxia documented. - Patient does endorse recurrent URI which she feels likely triggered her acute COPD exacerbation. - Patient with ongoing tobacco use smokes about a pack a day and has been trying to cut back. -Respiratory viral panel negative. -SARS coronavirus 2 PCR negative. -Influenza A and B by PCR negative -RSV by PCR negative. -Patient placed on IV Solu-Medrol , Pulmicort , Flonase , Claritin , PPI, scheduled DuoNebs, Tessalon  Perles and Hycodan as needed.  Patient also placed on IV Rocephin . -Patient improved slowly and clinically during the hospitalization. -Due to patient's multiple recurrent hospitalization patient seen by PCCM/pulmonary during the hospitalization. -Patient improved clinically, transition to oral steroid taper and will be discharged on 3 more days of oral antibiotics. -Outpatient follow-up with PCP and pulmonary.  2.  Hypertension - Patient maintained on home regimen Lopressor , HCTZ, Cozaar , amlodipine .    3.  Tobacco abuse -Tobacco cessation stressed to patient. - Nicotine  patch recommended however patient politely declined as she states has an intolerance to the nicotine  patch.   4.  Hyperlipidemia -Patient not on medications per med rec. - Fasting lipid panel with LDL of 76.   - Outpatient follow-up with PCP.    5.  Well-controlled diabetes mellitus type 2 -Hemoglobin A1c 5.4. - Diet controlled.  - Outpatient follow-up  with PCP.      Procedures: PCCM: Dr. Shelah 05/24/2024   Consultations: Chest x-ray 05/24/2024   Discharge Exam: Vitals:   05/29/24 0940 05/29/24 1447  BP: (!) 163/68 (!) 116/53  Pulse: (!) 106 77  Resp:  16  Temp:  98.7 F  (37.1 C)  SpO2:  100%    General: NAD Cardiovascular: RRR no murmurs rubs or gallops.  No JVD.  No lower extremity edema. Respiratory: Minimal expiratory wheezing.  Some scattered coarse breath sounds.  Fair air movement.  Speaking in full sentences.  Discharge Instructions   Discharge Instructions     Diet - low sodium heart healthy   Complete by: As directed    Increase activity slowly   Complete by: As directed    Pulmonary Visit   Complete by: As directed    Recurrent COPD exacerbations with multiple hospitalizations.   Reason for referral: Other Pulmonary      Allergies as of 05/29/2024       Reactions   Ace Inhibitors Anaphylaxis, Swelling, Other (See Comments)   Throat swelling  (Re: Lisinopril -hydrochlorothiazide ) Pt questioned if she is allergic in 2025.        Medication List     TAKE these medications    acetaminophen  500 MG tablet Commonly known as: TYLENOL  Take 1,000 mg by mouth daily as needed for moderate pain (pain score 4-6) or mild pain (pain score 1-3).   albuterol  108 (90 Base) MCG/ACT inhaler Commonly known as: VENTOLIN  HFA Inhale 2 puffs into the lungs every 6 (six) hours as needed for wheezing or shortness of breath. What changed: when to take this   albuterol  (2.5 MG/3ML) 0.083% nebulizer solution Commonly known as: PROVENTIL  Take 3 mLs (2.5 mg total) by nebulization every 4 (four) hours as needed for wheezing or shortness of breath. What changed: Another medication with the same name was changed. Make sure you understand how and when to take each.   amLODipine  10 MG tablet Commonly known as: NORVASC  Take 1 tablet (10 mg total) by mouth daily.   benzonatate  200 MG capsule Commonly known as: TESSALON  Take 1 capsule (200 mg total) by mouth 3 (three) times daily for 7 days.   cefUROXime  500 MG tablet Commonly known as: CEFTIN  Take 1 tablet (500 mg total) by mouth 2 (two) times daily with a meal for 3 days.   fluticasone  50 MCG/ACT  nasal spray Commonly known as: FLONASE  Place 2 sprays into both nostrils daily. Start taking on: May 30, 2024   fluticasone -salmeterol 100-50 MCG/ACT Aepb Commonly known as: ADVAIR  Inhale 1 puff into the lungs 2 (two) times daily. What changed: when to take this   guaiFENesin -dextromethorphan  100-10 MG/5ML syrup Commonly known as: ROBITUSSIN DM Take 10 mLs by mouth every 4 (four) hours as needed for cough.   hydrocortisone  2.5 % rectal cream Commonly known as: ANUSOL -HC Apply 1 Application topically 4 (four) times daily as needed for hemorrhoids.   ipratropium-albuterol  0.5-2.5 (3) MG/3ML Soln Commonly known as: DUONEB Take 3 mLs by nebulization 3 (three) times daily for 5 days, THEN 3 mLs every 6 (six) hours as needed. Start taking on: May 29, 2024 What changed: See the new instructions.   loratadine  10 MG tablet Commonly known as: CLARITIN  Take 1 tablet (10 mg total) by mouth daily. Start taking on: May 30, 2024   losartan -hydrochlorothiazide  50-12.5 MG tablet Commonly known as: HYZAAR Take 1 tablet by mouth 2 (two) times daily.   metoprolol  tartrate 50 MG tablet Commonly known  as: LOPRESSOR  Take 1 tablet (50 mg total) by mouth 2 (two) times daily.   pantoprazole  40 MG tablet Commonly known as: PROTONIX  Take 1 tablet (40 mg total) by mouth daily. Start taking on: May 30, 2024   predniSONE  20 MG tablet Commonly known as: DELTASONE  Take 2 tablets (40 mg total) by mouth daily with breakfast for 3 days, THEN 1 tablet (20 mg total) daily with breakfast for 3 days. Start taking on: May 30, 2024 What changed: See the new instructions.       Allergies  Allergen Reactions   Ace Inhibitors Anaphylaxis, Swelling and Other (See Comments)    Throat swelling  (Re: Lisinopril -hydrochlorothiazide )  Pt questioned if she is allergic in 2025.    Follow-up Information     Elnora Ip, MD. Schedule an appointment as soon as possible for a visit in 2 week(s).    Specialty: Internal Medicine Contact information: [address] [place] [address] [phone]         [facility] at [facility]. Schedule an appointment as soon as possible for a visit in 3 week(s).   Specialty: Pulmonology Contact information: [address] [place] [address] [place]  [address] [phone] Additional information: [address]  [address]  [place], [place] [address]                 The results of significant diagnostics from this hospitalization (including imaging, microbiology, ancillary and laboratory) are listed below for reference.    Significant Diagnostic Studies: DG Chest Portable 1 View Result Date: 05/24/2024 CLINICAL DATA:  Wheezing and shortness of breath. EXAM: PORTABLE CHEST 1 VIEW COMPARISON:  05/13/2024 FINDINGS: Heart size and mediastinal contours are normal. Lungs are hyperinflated but clear. No pleural fluid, interstitial edema or airspace consolidation. Surgical clips noted in the right axilla. Status post ACDF in the lower cervical spine. IMPRESSION: 1. No acute cardiopulmonary disease. Electronically Signed   By: Waddell Calk M.D.   On: 05/24/2024 06:39   DG Chest Port 1 View Result Date: 05/13/2024 CLINICAL DATA:  Shortness of breath. EXAM: PORTABLE CHEST 1 VIEW COMPARISON:  04/18/2024 FINDINGS: Surgical clips noted in the right axilla. Heart size and mediastinal contours are normal. Lungs are hyperinflated with coarsened interstitial markings of COPD/emphysema. No pleural fluid, interstitial edema or airspace disease. No pneumothorax. Postoperative changes noted within the lower cervical spine. Remote healed right rib fractures. IMPRESSION: 1. No acute findings. 2. COPD/emphysema. Electronically Signed   By: Waddell Calk M.D.   On: 05/13/2024 06:00    Microbiology: Recent Results (from the past 240 hours)  Resp panel by RT-PCR (RSV, Flu A&B, Covid) Anterior Nasal Swab     Status: None    Collection Time: 05/24/24 10:59 AM   Specimen: Anterior Nasal Swab  Result Value Ref Range Status   SARS Coronavirus 2 by RT PCR NEGATIVE NEGATIVE Final    Comment: (NOTE) SARS-CoV-2 target nucleic acids are NOT DETECTED.  The SARS-CoV-2 RNA is generally detectable in upper respiratory specimens during the acute phase of infection. The lowest concentration of SARS-CoV-2 viral copies this assay can detect is 138 copies/mL. A negative result does not preclude SARS-Cov-2 infection and should not be used as the sole basis for treatment or other patient management decisions. A negative result may occur with  improper specimen collection/handling, submission of specimen other than nasopharyngeal swab, presence of viral mutation(s) within the areas targeted by this assay, and inadequate number of viral copies(<138 copies/mL). A negative  result must be combined with clinical observations, patient history, and epidemiological information. The expected result is Negative.  Fact Sheet for Patients:  BloggerCourse.com  Fact Sheet for Healthcare Providers:  SeriousBroker.it  This test is no t yet approved or cleared by the [place]  FDA and  has been authorized for detection and/or diagnosis of SARS-CoV-2 by FDA under an Emergency Use Authorization (EUA). This EUA will remain  in effect (meaning this test can be used) for the duration of the COVID-19 declaration under Section 564(b)(1) of the Act, 21 U.S.C.section 360bbb-3(b)(1), unless the authorization is terminated  or revoked sooner.       Influenza A by PCR NEGATIVE NEGATIVE Final   Influenza B by PCR NEGATIVE NEGATIVE Final    Comment: (NOTE) The Xpert Xpress SARS-CoV-2/FLU/RSV plus assay is intended as an aid in the diagnosis of influenza from Nasopharyngeal swab specimens and should not be used as a sole basis for treatment. Nasal washings and aspirates are unacceptable for  Xpert Xpress SARS-CoV-2/FLU/RSV testing.  Fact Sheet for Patients: BloggerCourse.com  Fact Sheet for Healthcare Providers: SeriousBroker.it  This test is not yet approved or cleared by the [place]  FDA and has been authorized for detection and/or diagnosis of SARS-CoV-2 by FDA under an Emergency Use Authorization (EUA). This EUA will remain in effect (meaning this test can be used) for the duration of the COVID-19 declaration under Section 564(b)(1) of the Act, 21 U.S.C. section 360bbb-3(b)(1), unless the authorization is terminated or revoked.     Resp Syncytial Virus by PCR NEGATIVE NEGATIVE Final    Comment: (NOTE) Fact Sheet for Patients: BloggerCourse.com  Fact Sheet for Healthcare Providers: SeriousBroker.it  This test is not yet approved or cleared by the [place]  FDA and has been authorized for detection and/or diagnosis of SARS-CoV-2 by FDA under an Emergency Use Authorization (EUA). This EUA will remain in effect (meaning this test can be used) for the duration of the COVID-19 declaration under Section 564(b)(1) of the Act, 21 U.S.C. section 360bbb-3(b)(1), unless the authorization is terminated or revoked.  Performed at [facility], [address] [place], [place] [address]   Respiratory (~20 pathogens) panel by PCR     Status: None   Collection Time: 05/24/24 10:59 AM   Specimen: Nasopharyngeal Swab; Respiratory  Result Value Ref Range Status   Adenovirus NOT DETECTED NOT DETECTED Final   Coronavirus 229E NOT DETECTED NOT DETECTED Final    Comment: (NOTE) The Coronavirus on the Respiratory Panel, DOES NOT test for the novel  Coronavirus (2019 nCoV)    Coronavirus HKU1 NOT DETECTED NOT DETECTED Final   Coronavirus NL63 NOT DETECTED NOT DETECTED Final   Coronavirus OC43 NOT DETECTED NOT DETECTED Final   Metapneumovirus NOT  DETECTED NOT DETECTED Final   Rhinovirus / Enterovirus NOT DETECTED NOT DETECTED Final   Influenza A NOT DETECTED NOT DETECTED Final   Influenza B NOT DETECTED NOT DETECTED Final   Parainfluenza Virus 1 NOT DETECTED NOT DETECTED Final   Parainfluenza Virus 2 NOT DETECTED NOT DETECTED Final   Parainfluenza Virus 3 NOT DETECTED NOT DETECTED Final   Parainfluenza Virus 4 NOT DETECTED NOT DETECTED Final   Respiratory Syncytial Virus NOT DETECTED NOT DETECTED Final   Bordetella pertussis NOT DETECTED NOT DETECTED Final   Bordetella Parapertussis NOT DETECTED NOT DETECTED Final   Chlamydophila pneumoniae NOT DETECTED NOT DETECTED Final   Mycoplasma pneumoniae NOT DETECTED NOT DETECTED Final    Comment: Performed at [facility], [address] [place], [place] [address]     Labs: Basic Metabolic Panel: Recent Labs  Lab 05/24/24 0538 05/25/24 0406 05/26/24 0415 05/28/24 0409 05/29/24 0421  NA 138 136 136 134* 130*  K 3.6 4.1 3.8 3.9 4.3  CL 101 99 100 95* 92*  CO2 26 29 27 27 30   GLUCOSE 111* 132* 107* 144* 212*  BUN 8 13 16 21  25*  CREATININE 0.73 0.76 0.59 0.67 0.72  CALCIUM  8.8* 8.9 8.6* 8.5* 8.3*  MG  --   --   --  2.1  --    Liver Function Tests: No results for input(s): AST, ALT, ALKPHOS, BILITOT, PROT, ALBUMIN in the last 168 hours. No results for input(s): LIPASE, AMYLASE in the last 168 hours. No results for input(s): AMMONIA in the last 168 hours. CBC: Recent Labs  Lab 05/24/24 0538 05/25/24 0406 05/26/24 0415 05/28/24 0409  WBC 6.3 8.4 10.9* 14.2*  NEUTROABS 3.4  --   --  10.7*  HGB 12.9 12.4 12.2 12.8  HCT 40.0 38.6 37.7 40.0  MCV 90.5 93.2 92.9 93.7  PLT 351 336 341 319   Cardiac Enzymes: No results for input(s): CKTOTAL, CKMB, CKMBINDEX, TROPONINI in the last 168 hours. BNP: BNP (last 3 results) Recent Labs    01/12/24 0830 02/17/24 1633 04/18/24 1809  BNP 68.9 59.5 108.1*    ProBNP (last 3 results) No results  for input(s): PROBNP in the last 8760 hours.  CBG: No results for input(s): GLUCAP in the last 168 hours.     Signed:  Toribio Hummer MD.  [facility] 05/29/2024, 3:25 PM

## 2024-05-29 NOTE — Plan of Care (Signed)

## 2024-05-29 NOTE — Plan of Care (Signed)

## 2024-05-30 ENCOUNTER — Encounter

## 2024-06-12 ENCOUNTER — Other Ambulatory Visit: Payer: Self-pay | Admitting: Licensed Clinical Social Worker

## 2024-06-12 NOTE — Patient Instructions (Signed)
 Visit Information  Thank you for taking time to visit with me today. Please don't hesitate to contact me if I can be of assistance to you before our next scheduled appointment.  Our next appointment is by telephone on 07/23/24 at 9:30 AM   Please call the care guide team at [phone] if you need to cancel or reschedule your appointment.   Following is a copy of your care plan:   Goals Addressed             This Visit's Progress    VBCI Social Work Care Plan       Problems:  Client recently discharged from hospital. She is adjusting to being back at home with her spouse  COPD; breathing challenges Challenges in Walking Transport needs; needs help with daily ADLs Depression issues  CSW Clinical Goal(s):   Over the next 30 days the Patient will attend all scheduled medical appointments as evidenced by patient report and care team review of appointment completion in electronic medical record.             Over next 30 days patient will use coping skills learned to help her manage depression issues faced AEB patient report of improvement in managing depression symptoms.   Interventions:  Spoke with client about current needs and status             Discussed client recent discharge from hospital. She said she is glad to be back home. She is not receiving any home health services at this time, per client             Client has support of her spouse, [name]             Discussed breathing needs of client. She spoke of use of inhaler. She spoke of use of nebulizer            Provided counseling support to client             Discussed medication procurement            Discussed client support at PCP office.             Encouraged client to call LCSW as needed for SW support  Patient Goals/Self-Care Activities: Attend scheduled medical appointments and appointments with PCP Take medications as planned Call LCSW as needed for SW support Allow time to participate in  relaxation activities of choice to help manage depression issues faced  Plan:   LCSW to call client on 07/23/2024 at 9:30 AM         Please go to [facility] [address], [place] [phone]) if you are experiencing a Mental Health or Behavioral Health Crisis or need someone to talk to.  The patient verbalized understanding of instructions, educational materials, and care plan provided today and DECLINED offer to receive copy of patient instructions, educational materials, and care plan.    [name]  MSW, LCSW [facility] Licensed Clinical Social Worker Direct Dial:  [phone] Fax:  [phone] Website:  [email or web address]

## 2024-06-12 NOTE — Patient Outreach (Signed)
 Complex Care Management   Visit Note  06/12/2024  Name:  [name] MRN: [id number] DOB: [birth year]  Situation: Referral received for Complex Care Management related to depression issues I obtained verbal consent from Patient.  Visit completed with patient  on the phone  Background:   Past Medical History:  Diagnosis Date   Acute sinusitis 01/09/2019   Acute sinusitis 01/09/2019   Anxiety    Arthritis    fingers, knees (08/16/2018)   Asthma    Atopic dermatitis 03/20/2018   Axillary hidradenitis suppurativa 10/13/2018   Cancer of right breast (HCC) 1991   s/p lumpectomy, chemotherapy and radiation therapy in 1991. Mammogram in 2007 was normal.   Cellulitis of buttock, left 12/13/2022   Constipated    h/o   COPD (chronic obstructive pulmonary disease) (HCC)    History of multiple hospital admissions for exercabation    COPD exacerbation (HCC) 01/01/2019   COPD exacerbation (HCC) 02/17/2024   COPD with acute exacerbation (HCC) 01/14/2019   COPD with exacerbation (HCC) 04/06/2009   Qualifier: Diagnosis of  By: Loletta MD, Vijay     Depression    Diarrhea    h/o   Facial cellulitis 08/30/2022   Facial edema 08/31/2022   GERD (gastroesophageal reflux disease)    Grade I diastolic dysfunction 01/12/2024   Headache    a few times/month (08/16/2018   Heart murmur 10/05/2011   first time I ever heard I had one was today   History of breast cancer 12/01/2012   Pt with h/o breast CA s/p lumpectomy with chemo/radiation in 1991. Pt mammogram 2011 was unremarkable. Had CT chest 10/2012 in ED for SOB and showed spiculated nodule with lymph node. 12/04/12: Birads 2; repeat diagnostic mammogram in 1 year.     Hyperlipidemia    Hypertension    Lower extremity edema 09/21/2018   Obesity    Personal history of chemotherapy    Personal history of radiation therapy    Pneumonia    couple times in the last 10-15 yrs (08/16/2018)   QT prolongation 08/08/2014   Seasonal  allergies 02/25/2017   Shortness of breath 10/05/2011   at rest; lying down; w/exertion   Sigmoid diverticulitis 80/2008   Tobacco abuse    Type 2 diabetes mellitus (HCC) 05/14/2009   Type II diabetes mellitus (HCC)     Assessment: Patient Reported Symptoms:  Cognitive Cognitive Status: Alert and oriented to person, place, and time Cognitive/Intellectual Conditions Management [RPT]: None reported or documented in medical history or problem list   Health Maintenance Behaviors: Sleep adequate, Stress management Health Facilitated by: Stress management  Neurological Neurological Review of Symptoms: Weakness Neurological Management Strategies: Adequate rest, Coping strategies  HEENT HEENT Symptoms Reported: Sudden change or loss of vision HEENT Management Strategies: Coping strategies, Adequate rest  She said she may need a new pair of glasses  Cardiovascular Cardiovascular Symptoms Reported: short of breath occasionally ; weakness in walking  Does patient have uncontrolled Hypertension?: No Cardiovascular Management Strategies: Medication therapy, Routine screening  Respiratory Respiratory Symptoms Reported: Shortness of breath Respiratory Management Strategies: Coping strategies, Medication therapy, Activity, Routine screening  Endocrine Endocrine Symptoms Reported: Weakness or fatigue    Gastrointestinal Gastrointestinal Symptoms Reported: No symptoms reported Gastrointestinal Management Strategies: Adequate rest, Coping strategies    Genitourinary Genitourinary Symptoms Reported: No symptoms reported Genitourinary Management Strategies: Coping strategies, Adequate rest  Integumentary Integumentary Symptoms Reported: No symptoms reported Skin Management Strategies: Coping strategies  Musculoskeletal Musculoskelatal Symptoms Reviewed: Unsteady gait, Difficulty walking, Muscle pain  Additional Musculoskeletal Details: walking challenges Musculoskeletal Management Strategies:  Adequate rest, Coping strategies  Has a cane to use as needed; has a walker to use as needed    Psychosocial Psychosocial Symptoms Reported: Depression - if selected complete PHQ 2-9, Sadness - if selected complete PHQ 2-9 Behavioral Management Strategies: Coping strategies, Adequate rest, Activity Major Change/Loss/Stressor/Fears (CP): Medical condition, self Techniques to Cope with Loss/Stress/Change: Counseling, Diversional activities, Medication        06/12/2024    4:02 PM  Depression screen PHQ 2/9  Decreased Interest 1  Down, Depressed, Hopeless 1  PHQ - 2 Score 2  Altered sleeping 1  Tired, decreased energy 1  Change in appetite 1  Feeling bad or failure about yourself  1  Trouble concentrating 1  Moving slowly or fidgety/restless 1  Suicidal thoughts 0  PHQ-9 Score 8  Difficult doing work/chores Somewhat difficult    Vitals:  BP in normal range per client information  Medications Reviewed Today     Reviewed by Frances Ozell GORMAN KEN (Social Worker) on 06/12/24 at 1553  Med List Status: <None>   Medication Order Taking? Sig Documenting Provider Last Dose Status Informant  acetaminophen  (TYLENOL ) 500 MG tablet [id number] Yes Take 1,000 mg by mouth daily as needed for moderate pain (pain score 4-6) or mild pain (pain score 1-3). [provider]  Active Self, Pharmacy Records  albuterol  (PROVENTIL ) (2.5 MG/3ML) 0.083% nebulizer solution [id number] Yes Take 3 mLs (2.5 mg total) by nebulization every 4 (four) hours as needed for wheezing or shortness of breath. Gomez-Caraballo, Maria, MD  Active Self, Pharmacy Records  albuterol  (VENTOLIN  HFA) 108 [phone] Base) MCG/ACT inhaler [id number] Yes Inhale 2 puffs into the lungs every 6 (six) hours as needed for wheezing or shortness of breath. Jolaine Pac, DO  Active Self, Pharmacy Records  amLODipine  (NORVASC ) 10 MG tablet [id number] unknown Take 1 tablet (10 mg total) by mouth daily. Fernand Prost, MD  Active Self, Pharmacy  Records  fluticasone  (FLONASE ) 50 MCG/ACT nasal spray [id number] Yes Place 2 sprays into both nostrils daily. Sebastian Toribio GAILS, MD  Active   fluticasone -salmeterol (ADVAIR ) 100-50 MCG/ACT AEPB [id number] Yes Inhale 1 puff into the lungs 2 (two) times daily. Barbarann Nest, MD  Active Self, Pharmacy Records  guaiFENesin -dextromethorphan  [facility] DM) 100-10 MG/5ML syrup [id number] Not taking  Take 10 mLs by mouth every 4 (four) hours as needed for cough.  Patient not taking: Reported on 06/12/2024   Vann, Jessica U, DO  Active Self, Pharmacy Records             hydrocortisone  (ANUSOL -[facility]) 2.5 % rectal cream [id number] Not taking  Apply 1 Application topically 4 (four) times daily as needed for hemorrhoids.  Patient not taking: Reported on 06/12/2024   Sebastian Toribio GAILS, MD  Active   ipratropium-albuterol  (DUONEB) 0.5-2.5 (3) MG/3ML LARRAINE [id number] Yes Take 3 mLs by nebulization 3 (three) times daily for 5 days, THEN 3 mLs every 6 (six) hours as needed. Sebastian Toribio GAILS, MD  Active   loratadine  (CLARITIN ) 10 MG tablet [id number] Taking  Take 1 tablet (10 mg total) by mouth daily. Sebastian Toribio GAILS, MD  Active   losartan -hydrochlorothiazide  [facility]) 50-12.5 MG tablet [id number] Unknown  Take 1 tablet by mouth 2 (two) times daily. Gomez-Caraballo, Maria, MD  Active Self, Pharmacy Records             metoprolol  tartrate (LOPRESSOR ) 50 MG tablet [id number] Yes Take 1 tablet (50 mg total) by mouth 2 (two) times daily. Vann,  Harlene PENNER, DO  Active Self, Pharmacy Records  pantoprazole  (PROTONIX ) 40 MG tablet [id number] Yes Take 1 tablet (40 mg total) by mouth daily. Sebastian Toribio GAILS, MD  Active             Recommendation:   PCP Follow-up Continue Current Plan of Care Call LCSW as needed for SW support  Follow Up Plan:   LCSW to call client on 07/23/24 at 9:30 AM   Glendia Pear  MSW, LCSW [place] Licensed Clinical Social Worker Direct Dial:   [phone] Fax:  [phone] Website:  [email or web address]

## 2024-06-20 ENCOUNTER — Emergency Department (HOSPITAL_COMMUNITY)

## 2024-06-20 ENCOUNTER — Inpatient Hospital Stay (HOSPITAL_COMMUNITY)
Admission: EM | Admit: 2024-06-20 | Discharge: 2024-06-25 | DRG: 192 | Disposition: A | Discharge status: Home / Self Care | Attending: Internal Medicine | Admitting: Internal Medicine

## 2024-06-20 ENCOUNTER — Other Ambulatory Visit: Payer: Self-pay

## 2024-06-20 ENCOUNTER — Encounter (HOSPITAL_COMMUNITY): Payer: Self-pay

## 2024-06-20 DIAGNOSIS — Z923 Personal history of irradiation: Secondary | ICD-10-CM

## 2024-06-20 DIAGNOSIS — J441 Chronic obstructive pulmonary disease with (acute) exacerbation: Secondary | ICD-10-CM | POA: Diagnosis not present

## 2024-06-20 DIAGNOSIS — Z79899 Other long term (current) drug therapy: Secondary | ICD-10-CM | POA: Diagnosis not present

## 2024-06-20 DIAGNOSIS — Z9221 Personal history of antineoplastic chemotherapy: Secondary | ICD-10-CM | POA: Diagnosis not present

## 2024-06-20 DIAGNOSIS — I10 Essential (primary) hypertension: Secondary | ICD-10-CM | POA: Diagnosis present

## 2024-06-20 DIAGNOSIS — K219 Gastro-esophageal reflux disease without esophagitis: Secondary | ICD-10-CM | POA: Diagnosis not present

## 2024-06-20 DIAGNOSIS — F17210 Nicotine dependence, cigarettes, uncomplicated: Secondary | ICD-10-CM | POA: Diagnosis present

## 2024-06-20 DIAGNOSIS — Z1152 Encounter for screening for COVID-19: Secondary | ICD-10-CM | POA: Diagnosis not present

## 2024-06-20 DIAGNOSIS — Z7951 Long term (current) use of inhaled steroids: Secondary | ICD-10-CM | POA: Diagnosis not present

## 2024-06-20 DIAGNOSIS — R7989 Other specified abnormal findings of blood chemistry: Secondary | ICD-10-CM | POA: Diagnosis present

## 2024-06-20 DIAGNOSIS — R0602 Shortness of breath: Secondary | ICD-10-CM | POA: Diagnosis not present

## 2024-06-20 DIAGNOSIS — R778 Other specified abnormalities of plasma proteins: Secondary | ICD-10-CM | POA: Diagnosis not present

## 2024-06-20 LAB — CBC
HCT: 36.8 % (ref 36.0–46.0)
Hemoglobin: 11.9 g/dL — ABNORMAL LOW (ref 12.0–15.0)
MCH: 29.2 pg (ref 26.0–34.0)
MCHC: 32.3 g/dL (ref 30.0–36.0)
MCV: 90.2 fL (ref 80.0–100.0)
Platelets: 370 K/uL (ref 150–400)
RBC: 4.08 MIL/uL (ref 3.87–5.11)
RDW: 14.5 % (ref 11.5–15.5)
WBC: 4.1 K/uL (ref 4.0–10.5)
nRBC: 0 % (ref 0.0–0.2)

## 2024-06-20 LAB — BRAIN NATRIURETIC PEPTIDE: B Natriuretic Peptide: 161.8 pg/mL — ABNORMAL HIGH (ref 0.0–100.0)

## 2024-06-20 LAB — RESP PANEL BY RT-PCR (RSV, FLU A&B, COVID)  RVPGX2
Influenza A by PCR: NEGATIVE
Influenza B by PCR: NEGATIVE
Resp Syncytial Virus by PCR: NEGATIVE
SARS Coronavirus 2 by RT PCR: NEGATIVE

## 2024-06-20 LAB — BASIC METABOLIC PANEL WITH GFR
Anion gap: 9 (ref 5–15)
BUN: 6 mg/dL — ABNORMAL LOW (ref 8–23)
CO2: 23 mmol/L (ref 22–32)
Calcium: 8.8 mg/dL — ABNORMAL LOW (ref 8.9–10.3)
Chloride: 105 mmol/L (ref 98–111)
Creatinine, Ser: 0.65 mg/dL (ref 0.44–1.00)
GFR, Estimated: >60 mL/min (ref >60)
Glucose, Bld: 120 mg/dL — ABNORMAL HIGH (ref 70–99)
Potassium: 3.6 mmol/L (ref 3.5–5.1)
Sodium: 137 mmol/L (ref 135–145)

## 2024-06-20 LAB — TROPONIN I (HIGH SENSITIVITY)
Troponin I (High Sensitivity): 41 ng/L — ABNORMAL HIGH (ref <18)
Troponin I (High Sensitivity): 29 ng/L — ABNORMAL HIGH (ref <18)

## 2024-06-20 MED ORDER — ONDANSETRON HCL 4 MG PO TABS: 4.0000 mg | ORAL_TABLET | Freq: Four times a day (QID) | ORAL | Status: DC | PRN

## 2024-06-20 MED ORDER — METHYLPREDNISOLONE SODIUM SUCC 125 MG IJ SOLR
125.0000 mg | Freq: Once | INTRAMUSCULAR | Status: AC
  Administered 2024-06-20 (×2): 125 mg via INTRAVENOUS
  Filled 2024-06-20: qty 2

## 2024-06-20 MED ORDER — ACETAMINOPHEN 650 MG RE SUPP: 650.0000 mg | Freq: Four times a day (QID) | RECTAL | Status: DC | PRN

## 2024-06-20 MED ORDER — ONDANSETRON HCL 4 MG/2ML IJ SOLN
4.0000 mg | Freq: Four times a day (QID) | INTRAMUSCULAR | Status: AC | PRN
Start: 2024-06-20 — End: ?

## 2024-06-20 MED ORDER — FLUTICASONE FUROATE-VILANTEROL 100-25 MCG/ACT IN AEPB
1.0000 | INHALATION_SPRAY | Freq: Every day | RESPIRATORY_TRACT | Status: DC
  Administered 2024-06-21: 1 via RESPIRATORY_TRACT
  Filled 2024-06-20: qty 28

## 2024-06-20 MED ORDER — ACETAMINOPHEN 325 MG PO TABS
650.0000 mg | ORAL_TABLET | Freq: Four times a day (QID) | ORAL | Status: DC | PRN
  Administered 2024-06-23: 650 mg via ORAL
  Filled 2024-06-20: qty 2

## 2024-06-20 MED ORDER — ENOXAPARIN SODIUM 40 MG/0.4ML IJ SOSY
40.0000 mg | PREFILLED_SYRINGE | INTRAMUSCULAR | Status: DC
  Administered 2024-06-20 – 2024-06-24 (×6): 40 mg via SUBCUTANEOUS
  Filled 2024-06-20 (×5): qty 0.4

## 2024-06-20 MED ORDER — ALBUTEROL SULFATE (2.5 MG/3ML) 0.083% IN NEBU
2.5000 mg | INHALATION_SOLUTION | RESPIRATORY_TRACT | Status: DC | PRN
  Administered 2024-06-20 – 2024-06-24 (×12): 2.5 mg via RESPIRATORY_TRACT
  Filled 2024-06-20 (×11): qty 3

## 2024-06-20 MED ORDER — HYDROCHLOROTHIAZIDE 12.5 MG PO TABS
12.5000 mg | ORAL_TABLET | Freq: Every day | ORAL | Status: DC
  Administered 2024-06-20 – 2024-06-25 (×7): 12.5 mg via ORAL
  Filled 2024-06-20 (×6): qty 1

## 2024-06-20 MED ORDER — IPRATROPIUM BROMIDE 0.02 % IN SOLN
0.5000 mg | Freq: Once | RESPIRATORY_TRACT | Status: AC
  Administered 2024-06-20 (×2): 0.5 mg via RESPIRATORY_TRACT
  Filled 2024-06-20: qty 2.5

## 2024-06-20 MED ORDER — METOPROLOL TARTRATE 50 MG PO TABS
50.0000 mg | ORAL_TABLET | Freq: Two times a day (BID) | ORAL | Status: DC
  Administered 2024-06-20 – 2024-06-25 (×13): 50 mg via ORAL
  Filled 2024-06-20 (×9): qty 1
  Filled 2024-06-20: qty 2
  Filled 2024-06-20: qty 1

## 2024-06-20 MED ORDER — METHYLPREDNISOLONE SODIUM SUCC 40 MG IJ SOLR
40.0000 mg | Freq: Two times a day (BID) | INTRAMUSCULAR | Status: DC
  Administered 2024-06-20 – 2024-06-22 (×5): 40 mg via INTRAVENOUS
  Filled 2024-06-20 (×4): qty 1

## 2024-06-20 MED ORDER — PANTOPRAZOLE SODIUM 40 MG PO TBEC
40.0000 mg | DELAYED_RELEASE_TABLET | Freq: Every day | ORAL | Status: DC
  Administered 2024-06-20 – 2024-06-25 (×7): 40 mg via ORAL
  Filled 2024-06-20 (×6): qty 1

## 2024-06-20 MED ORDER — MAGNESIUM SULFATE 2 GM/50ML IV SOLN
2.0000 g | Freq: Once | INTRAVENOUS | Status: AC
  Administered 2024-06-20 (×2): 2 g via INTRAVENOUS
  Filled 2024-06-20: qty 50

## 2024-06-20 MED ORDER — LORATADINE 10 MG PO TABS: 10.0000 mg | ORAL_TABLET | Freq: Every day | ORAL | Status: DC

## 2024-06-20 MED ORDER — ALBUTEROL SULFATE (2.5 MG/3ML) 0.083% IN NEBU
INHALATION_SOLUTION | RESPIRATORY_TRACT | Status: AC
  Filled 2024-06-20: qty 9

## 2024-06-20 MED ORDER — ALBUTEROL SULFATE (2.5 MG/3ML) 0.083% IN NEBU: 3.0000 mL | INHALATION_SOLUTION | Freq: Once | RESPIRATORY_TRACT | Status: DC

## 2024-06-20 MED ORDER — ALBUTEROL SULFATE (2.5 MG/3ML) 0.083% IN NEBU
10.0000 mg/h | INHALATION_SOLUTION | Freq: Once | RESPIRATORY_TRACT | Status: AC
  Administered 2024-06-20 (×2): 10 mg/h via RESPIRATORY_TRACT
  Filled 2024-06-20: qty 3

## 2024-06-20 MED ORDER — AMLODIPINE BESYLATE 10 MG PO TABS
10.0000 mg | ORAL_TABLET | Freq: Every day | ORAL | Status: DC
  Administered 2024-06-20 – 2024-06-25 (×7): 10 mg via ORAL
  Filled 2024-06-20 (×2): qty 1
  Filled 2024-06-20: qty 2
  Filled 2024-06-20 (×3): qty 1

## 2024-06-20 MED ORDER — LOSARTAN POTASSIUM 50 MG PO TABS
50.0000 mg | ORAL_TABLET | Freq: Every day | ORAL | Status: DC
  Administered 2024-06-20 – 2024-06-25 (×7): 50 mg via ORAL
  Filled 2024-06-20 (×6): qty 1

## 2024-06-20 MED ORDER — TRAZODONE HCL 50 MG PO TABS
25.0000 mg | ORAL_TABLET | Freq: Every evening | ORAL | Status: DC | PRN
  Administered 2024-06-20 – 2024-06-23 (×5): 25 mg via ORAL
  Filled 2024-06-20 (×4): qty 1

## 2024-06-20 MED ORDER — FLUTICASONE PROPIONATE 50 MCG/ACT NA SUSP
2.0000 | Freq: Every day | NASAL | Status: DC
  Administered 2024-06-21 – 2024-06-25 (×5): 2 via NASAL
  Filled 2024-06-20: qty 16

## 2024-06-20 MED ORDER — LOSARTAN POTASSIUM-HCTZ 50-12.5 MG PO TABS: 1.0000 | ORAL_TABLET | Freq: Two times a day (BID) | ORAL | Status: DC

## 2024-06-20 MED ORDER — GUAIFENESIN 100 MG/5ML PO LIQD
5.0000 mL | ORAL | Status: DC | PRN
  Administered 2024-06-21 – 2024-06-24 (×6): 5 mL via ORAL
  Filled 2024-06-20 (×6): qty 10

## 2024-06-20 NOTE — H&P (Signed)
 History and Physical  [name] FMW:[id number] DOB: [birth year] DOA: 06/20/2024  PCP: Christie Ip, MD   Chief Complaint: wheezing, shortness of breath   HPI: [name] is a 73 y.o. female with medical history significant for hypertension, COPD on room air, ongoing tobacco abuse multiple hospital admissions for acute exacerbation of COPD last discharged on 7/22 returns with 2 days of congestion, cough, dyspnea and shortness of breath being admitted to the hospital with recurrent acute exacerbation of COPD.  The patient states that she continues to smoke about 1 pack of cigarettes per day, she has been stable at home for the last few weeks without any respiratory symptoms.  Starting about 2 days ago, she started having a runny nose, congestion, nonproductive cough.  She got acutely short of breath early this morning and presented to the emergency department.  She was given IV steroids, breathing treatments, IV magnesium .  She has been placed on 2 L nasal cannula oxygen .  States that she feels a little better currently.  Denies any chest pain, fevers or productive cough.  Review of Systems: Please see HPI for pertinent positives and negatives. A complete 10 system review of systems are otherwise negative.  Past Medical History:  Diagnosis Date   Acute sinusitis 01/09/2019   Acute sinusitis 01/09/2019   Anxiety    Arthritis    fingers, knees (08/16/2018)   Asthma    Atopic dermatitis 03/20/2018   Axillary hidradenitis suppurativa 10/13/2018   Cancer of right breast (HCC) 1991   s/p lumpectomy, chemotherapy and radiation therapy in 1991. Mammogram in 2007 was normal.   Cellulitis of buttock, left 12/13/2022   Constipated    h/o   COPD (chronic obstructive pulmonary disease) (HCC)    History of multiple hospital admissions for exercabation    COPD exacerbation (HCC) 01/01/2019   COPD exacerbation (HCC) 02/17/2024   COPD with acute exacerbation (HCC) 01/14/2019    COPD with exacerbation (HCC) 04/06/2009   Qualifier: Diagnosis of  By: Loletta MD, Vijay     Depression    Diarrhea    h/o   Facial cellulitis 08/30/2022   Facial edema 08/31/2022   GERD (gastroesophageal reflux disease)    Grade I diastolic dysfunction 01/12/2024   Headache    a few times/month (08/16/2018   Heart murmur 10/05/2011   first time I ever heard I had one was today   History of breast cancer 12/01/2012   Pt with h/o breast CA s/p lumpectomy with chemo/radiation in 1991. Pt mammogram 2011 was unremarkable. Had CT chest 10/2012 in ED for SOB and showed spiculated nodule with lymph node. 12/04/12: Birads 2; repeat diagnostic mammogram in 1 year.     Hyperlipidemia    Hypertension    Lower extremity edema 09/21/2018   Obesity    Personal history of chemotherapy    Personal history of radiation therapy    Pneumonia    couple times in the last 10-15 yrs (08/16/2018)   QT prolongation 08/08/2014   Seasonal allergies 02/25/2017   Shortness of breath 10/05/2011   at rest; lying down; w/exertion   Sigmoid diverticulitis 80/2008   Tobacco abuse    Type 2 diabetes mellitus (HCC) 05/14/2009   Type II diabetes mellitus (HCC)    Past Surgical History:  Procedure Laterality Date   ABDOMINAL HYSTERECTOMY     ANTERIOR CERVICAL DECOMP/DISCECTOMY FUSION  2012   Dr. Beuford  put plate in; did something to my vertebrae   BACK SURGERY  BREAST LUMPECTOMY Right 1991   DOBUTAMINE  STRESS ECHO  08/2004   Inferior ischemia, normal LV systolic function, no significant CAD   MINOR IRRIGATION AND DEBRIDEMENT OF WOUND Right 09/04/2022   Procedure: IRRIGATION AND DEBRIDEMENT OF NASAL WOUND;  Surgeon: Luciano Standing, MD;  Location: [facility] OR;  Service: ENT;  Laterality: Right;   Social History:  reports that she has been smoking cigarettes. She has a 22.5 pack-year smoking history. She has never used smokeless tobacco. She reports current alcohol  use of about 4.0 standard drinks of  alcohol  per week. She reports that she does not use drugs.  Allergies  Allergen Reactions   Ace Inhibitors Anaphylaxis, Swelling and Other (See Comments)    Throat swelling  (Re: Lisinopril -hydrochlorothiazide )  Pt questioned if she is allergic in 2025.    Family History  Problem Relation Age of Onset   Cancer Mother        unknown   Breast cancer Daughter 37   Breast cancer Daughter        66s   BRCA 1/2 Neg Hx      Prior to Admission medications   Medication Sig Start Date End Date Taking? Authorizing Provider  acetaminophen  (TYLENOL ) 500 MG tablet Take 1,000 mg by mouth daily as needed for moderate pain (pain score 4-6) or mild pain (pain score 1-3).    [provider]  albuterol  (PROVENTIL ) (2.5 MG/3ML) 0.083% nebulizer solution Take 3 mLs (2.5 mg total) by nebulization every 4 (four) hours as needed for wheezing or shortness of breath. 04/21/24   Christie Ip, MD  albuterol  (VENTOLIN  HFA) 108 (90 Base) MCG/ACT inhaler Inhale 2 puffs into the lungs every 6 (six) hours as needed for wheezing or shortness of breath. 01/10/24   Jolaine Pac, DO  amLODipine  (NORVASC ) 10 MG tablet Take 1 tablet (10 mg total) by mouth daily. 03/19/24 03/19/25  Fernand Prost, MD  fluticasone  (FLONASE ) 50 MCG/ACT nasal spray Place 2 sprays into both nostrils daily. 05/30/24   Sebastian Toribio GAILS, MD  fluticasone -salmeterol (ADVAIR ) 100-50 MCG/ACT AEPB Inhale 1 puff into the lungs 2 (two) times daily. 05/14/24   Barbarann Nest, MD  guaiFENesin -dextromethorphan  (ROBITUSSIN DM) 100-10 MG/5ML syrup Take 10 mLs by mouth every 4 (four) hours as needed for cough. Patient not taking: Reported on 06/12/2024 04/22/24   Vann, Jessica U, DO  hydrocortisone  (ANUSOL -[facility]) 2.5 % rectal cream Apply 1 Application topically 4 (four) times daily as needed for hemorrhoids. Patient not taking: Reported on 06/12/2024 05/29/24   Sebastian Toribio GAILS, MD  ipratropium-albuterol  (DUONEB) 0.5-2.5 (3) MG/3ML SOLN Take 3 mLs by  nebulization 3 (three) times daily for 5 days, THEN 3 mLs every 6 (six) hours as needed. 05/29/24 06/03/25  Sebastian Toribio GAILS, MD  loratadine  (CLARITIN ) 10 MG tablet Take 1 tablet (10 mg total) by mouth daily. 05/30/24   Sebastian Toribio GAILS, MD  losartan -hydrochlorothiazide  (HYZAAR) 50-12.5 MG tablet Take 1 tablet by mouth 2 (two) times daily. 03/20/24   Gomez-Caraballo, Maria, MD  metoprolol  tartrate (LOPRESSOR ) 50 MG tablet Take 1 tablet (50 mg total) by mouth 2 (two) times daily. 04/22/24   Vann, Jessica U, DO  pantoprazole  (PROTONIX ) 40 MG tablet Take 1 tablet (40 mg total) by mouth daily. 05/30/24   Sebastian Toribio GAILS, MD    Physical Exam: BP (!) 174/100 (BP Location: Right Arm)   Pulse 98   Temp 98.2 F (36.8 C) (Oral)   Resp (!) 22   Ht 5' 6 (1.676 m)   Wt 72.6 kg  SpO2 100%   BMI 25.82 kg/m  General:  Alert, oriented, calm, in no acute distress, currently no cough, speaking in full sentences.  She is saturating 100% on 2 L nasal cannula oxygen . Cardiovascular: RRR, no murmurs or rubs, no peripheral edema  Respiratory: Breath sounds are distant, but she has good equal bilateral air movement.  There is some diffuse rhonchi, no obvious wheezing, no basilar crackles. Abdomen: soft, nontender, nondistended, normal bowel tones heard  Skin: dry, no rashes  Musculoskeletal: no joint effusions, normal range of motion  Psychiatric: appropriate affect, normal speech  Neurologic: extraocular muscles intact, clear speech, moving all extremities with intact sensorium         Labs on Admission:  Basic Metabolic Panel: Recent Labs  Lab 06/20/24 0810  NA 137  K 3.6  CL 105  CO2 23  GLUCOSE 120*  BUN 6*  CREATININE 0.65  CALCIUM  8.8*   Liver Function Tests: No results for input(s): AST, ALT, ALKPHOS, BILITOT, PROT, ALBUMIN in the last 168 hours. No results for input(s): LIPASE, AMYLASE in the last 168 hours. No results for input(s): AMMONIA in the last 168  hours. CBC: Recent Labs  Lab 06/20/24 0810  WBC 4.1  HGB 11.9*  HCT 36.8  MCV 90.2  PLT 370   Cardiac Enzymes: No results for input(s): CKTOTAL, CKMB, CKMBINDEX, TROPONINI in the last 168 hours. BNP (last 3 results) Recent Labs    02/17/24 1633 04/18/24 1809 06/20/24 0825  BNP 59.5 108.1* 161.8*    ProBNP (last 3 results) No results for input(s): PROBNP in the last 8760 hours.  CBG: No results for input(s): GLUCAP in the last 168 hours.  Radiological Exams on Admission: DG Chest 2 View Result Date: 06/20/2024 CLINICAL DATA:  Shortness of breath. EXAM: CHEST - 2 VIEW COMPARISON:  May 24, 2024. FINDINGS: The heart size and mediastinal contours are within normal limits. Both lungs are clear. The visualized skeletal structures are unremarkable. IMPRESSION: No active cardiopulmonary disease. Electronically Signed   By: Lynwood Landy Raddle M.D.   On: 06/20/2024 09:05   Assessment/Plan [name] is a 73 y.o. female with medical history significant for hypertension, COPD on room air, ongoing tobacco abuse multiple hospital admissions for acute exacerbation of COPD last discharged on 7/22 returns with 2 days of congestion, cough, dyspnea and shortness of breath being admitted to the hospital with recurrent acute exacerbation of COPD.  Acute exacerbation of COPD-with increased cough, dyspnea with exertion, symptoms of congestion.  Patient denies productive cough, or chest pain.  No sick contacts or fevers. -Inpatient admission -Breo Ellipta  daily, with as needed albuterol  -Check viral respiratory panel -Incentive spirometer and flutter valve -Continue supplemental oxygen , wean as tolerated with goal O2 saturation greater than 90% -Continue daily Claritin , Flonase  -Guaifenesin  liquid as needed for cough -Once again patient was strongly counseled to completely cease the abuse of tobacco  Abnormal troponin-initial troponin 41, without acute EKG changes or complaints  of chest pain.  Will monitor on telemetry, and trend troponin.  Hypertension-continue home Lopressor , amlodipine , Hyzaar  GERD-Protonix  daily  DVT prophylaxis: Lovenox      Code Status: Full Code  Consults called: None  Admission status: The appropriate patient status for this patient is INPATIENT. Inpatient status is judged to be reasonable and necessary in order to provide the required intensity of service to ensure the patient's safety. The patient's presenting symptoms, physical exam findings, and initial radiographic and laboratory data in the context of their chronic comorbidities is felt  to place them at high risk for further clinical deterioration. Furthermore, it is not anticipated that the patient will be medically stable for discharge from the hospital within 2 midnights of admission.    I certify that at the point of admission it is my clinical judgment that the patient will require inpatient hospital care spanning beyond 2 midnights from the point of admission due to high intensity of service, high risk for further deterioration and high frequency of surveillance required  Time spent: 56 minutes  [name] CHRISTELLA Gail MD [facility] Pager [phone]  If 7PM-7AM, please contact night-coverage [email or web address] Password [email or web address]  06/20/2024, 11:11 AM

## 2024-06-20 NOTE — ED Triage Notes (Signed)
 Pt presents to ED from home C/O SOB, started with cold like symptoms 3 days ago and has progressively gotten worse. Unable to speak in complete sentences in triage. SaO2 99% on room air. Taken back to room 18 immediately.

## 2024-06-20 NOTE — ED Provider Notes (Signed)
 [place] EMERGENCY DEPARTMENT AT [facility] Provider Note   CSN: [id number] Arrival date & time: 06/20/24  9197     Patient presents with: Shortness of Breath   [name] is a 73 y.o. female.    Shortness of Breath    Patient has a history of diabetes osteoporosis hyperlipidemia hypertension COPD, hypertension, chronic tobacco use.  Patient presents ED with complaints of shortness of breath.  Patient states the symptoms started few days ago.  Patient states that has been pretty severe since the start.  She decided to come in today for evaluation because it was not getting any better.  Patient has been trying her breathing treatments without relief.  She has not had any significant coughing.  No chest pain.  No leg swelling.  Patient does continue to smoke  Prior to Admission medications   Medication Sig Start Date End Date Taking? Authorizing Provider  acetaminophen  (TYLENOL ) 500 MG tablet Take 1,000 mg by mouth daily as needed for moderate pain (pain score 4-6) or mild pain (pain score 1-3).    [provider]  albuterol  (PROVENTIL ) (2.5 MG/3ML) 0.083% nebulizer solution Take 3 mLs (2.5 mg total) by nebulization every 4 (four) hours as needed for wheezing or shortness of breath. 04/21/24   Elnora Ip, MD  albuterol  (VENTOLIN  HFA) 108 (90 Base) MCG/ACT inhaler Inhale 2 puffs into the lungs every 6 (six) hours as needed for wheezing or shortness of breath. 01/10/24   Jolaine Pac, DO  amLODipine  (NORVASC ) 10 MG tablet Take 1 tablet (10 mg total) by mouth daily. 03/19/24 03/19/25  Fernand Prost, MD  fluticasone  (FLONASE ) 50 MCG/ACT nasal spray Place 2 sprays into both nostrils daily. 05/30/24   Sebastian Toribio GAILS, MD  fluticasone -salmeterol (ADVAIR ) 100-50 MCG/ACT AEPB Inhale 1 puff into the lungs 2 (two) times daily. 05/14/24   Barbarann Nest, MD  guaiFENesin -dextromethorphan  (ROBITUSSIN DM) 100-10 MG/5ML syrup Take 10 mLs by mouth every 4 (four)  hours as needed for cough. Patient not taking: Reported on 06/12/2024 04/22/24   Vann, Jessica U, DO  hydrocortisone  (ANUSOL -HC) 2.5 % rectal cream Apply 1 Application topically 4 (four) times daily as needed for hemorrhoids. Patient not taking: Reported on 06/12/2024 05/29/24   Sebastian Toribio GAILS, MD  ipratropium-albuterol  (DUONEB) 0.5-2.5 (3) MG/3ML SOLN Take 3 mLs by nebulization 3 (three) times daily for 5 days, THEN 3 mLs every 6 (six) hours as needed. 05/29/24 06/03/25  Sebastian Toribio GAILS, MD  loratadine  (CLARITIN ) 10 MG tablet Take 1 tablet (10 mg total) by mouth daily. 05/30/24   Sebastian Toribio GAILS, MD  losartan -hydrochlorothiazide  (HYZAAR) 50-12.5 MG tablet Take 1 tablet by mouth 2 (two) times daily. 03/20/24   Elnora Ip, MD  metoprolol  tartrate (LOPRESSOR ) 50 MG tablet Take 1 tablet (50 mg total) by mouth 2 (two) times daily. 04/22/24   Vann, Jessica U, DO  pantoprazole  (PROTONIX ) 40 MG tablet Take 1 tablet (40 mg total) by mouth daily. 05/30/24   Sebastian Toribio GAILS, MD    Allergies: Ace inhibitors    Review of Systems  Respiratory:  Positive for shortness of breath.     Updated Vital Signs BP (!) 168/81   Pulse 91   Temp 98.2 F (36.8 C) (Oral)   Resp (!) 23   Ht 1.676 m (5' 6)   Wt 72.6 kg   SpO2 99%   BMI 25.82 kg/m   Physical Exam Vitals and nursing note reviewed.  Constitutional:      Appearance: She is  ill-appearing. She is not diaphoretic.  HENT:     Head: Normocephalic and atraumatic.     Right Ear: External ear normal.     Left Ear: External ear normal.  Eyes:     General: No scleral icterus.       Right eye: No discharge.        Left eye: No discharge.     Conjunctiva/sclera: Conjunctivae normal.  Neck:     Trachea: No tracheal deviation.  Cardiovascular:     Rate and Rhythm: Normal rate and regular rhythm.  Pulmonary:     Effort: Tachypnea and accessory muscle usage present.     Breath sounds: No stridor. Wheezing present. No rales.      Comments: Pursed lip breathing Abdominal:     General: Bowel sounds are normal. There is no distension.     Palpations: Abdomen is soft.     Tenderness: There is no abdominal tenderness. There is no guarding or rebound.  Musculoskeletal:        General: No tenderness or deformity.     Cervical back: Neck supple.     Right lower leg: No edema.     Left lower leg: No edema.  Skin:    General: Skin is warm and dry.     Findings: No rash.  Neurological:     General: No focal deficit present.     Mental Status: She is alert.     Cranial Nerves: No cranial nerve deficit, dysarthria or facial asymmetry.     Sensory: No sensory deficit.     Motor: No abnormal muscle tone or seizure activity.     Coordination: Coordination normal.  Psychiatric:        Mood and Affect: Mood normal.     (all labs ordered are listed, but only abnormal results are displayed) Labs Reviewed  BASIC METABOLIC PANEL WITH GFR - Abnormal; Notable for the following components:      Result Value   Glucose, Bld 120 (*)    BUN 6 (*)    Calcium  8.8 (*)    All other components within normal limits  CBC - Abnormal; Notable for the following components:   Hemoglobin 11.9 (*)    All other components within normal limits  BRAIN NATRIURETIC PEPTIDE - Abnormal; Notable for the following components:   B Natriuretic Peptide 161.8 (*)    All other components within normal limits  TROPONIN I (HIGH SENSITIVITY) - Abnormal; Notable for the following components:   Troponin I (High Sensitivity) 41 (*)    All other components within normal limits  TROPONIN I (HIGH SENSITIVITY)    EKG: EKG Interpretation Date/Time:  Wednesday June 20 2024 08:18:12 EDT Ventricular Rate:  107 PR Interval:  142 QRS Duration:  100 QT Interval:  380 QTC Calculation: 498 R Axis:   48  Text Interpretation: Sinus tachycardia Multiform ventricular premature complexes Biatrial enlargement Repol abnrm suggests ischemia, lateral leads Artifact in  lead(s) I II III aVR aVL aVF V1 V2 V3 V4 V5 V6 No significant change since last tracing Confirmed by Randol Simmonds [phone]) on 06/20/2024 8:30:04 AM  Radiology: [facility] Chest 2 View Result Date: 06/20/2024 CLINICAL DATA:  Shortness of breath. EXAM: CHEST - 2 VIEW COMPARISON:  May 24, 2024. FINDINGS: The heart size and mediastinal contours are within normal limits. Both lungs are clear. The visualized skeletal structures are unremarkable. IMPRESSION: No active cardiopulmonary disease. Electronically Signed   By: Lynwood Landy Raddle M.D.   On: 06/20/2024 09:05     .  Critical Care  Performed by: Randol Simmonds, MD Authorized by: Randol Simmonds, MD   Critical care provider statement:    Critical care time (minutes):  30   Critical care was time spent personally by me on the following activities:  Development of treatment plan with patient or surrogate, discussions with consultants, evaluation of patient's response to treatment, examination of patient, ordering and review of laboratory studies, ordering and review of radiographic studies, ordering and performing treatments and interventions, pulse oximetry, re-evaluation of patient's condition and review of old charts    Medications Ordered in the ED  albuterol  (PROVENTIL ) (2.5 MG/3ML) 0.083% nebulizer solution (  Not Given 06/20/24 0923)  ipratropium (ATROVENT ) nebulizer solution 0.5 mg (0.5 mg Nebulization Given 06/20/24 0907)  methylPREDNISolone  sodium succinate (SOLU-MEDROL ) 125 mg/2 mL injection 125 mg (125 mg Intravenous Given 06/20/24 0900)  magnesium  sulfate IVPB 2 g 50 mL (2 g Intravenous New Bag/Given 06/20/24 0901)  albuterol  (PROVENTIL ) (2.5 MG/3ML) 0.083% nebulizer solution (10 mg/hr Nebulization Given by Other 06/20/24 0923)    Clinical Course as of 06/20/24 1035  Wed Jun 20, 2024  0931 Troponin I (High Sensitivity)(!) Troponin increased at 41 [JK]  0931 Brain natriuretic peptide(!) BNP also increased compared to previous [JK]  0932 Chest x-ray without  acute findings [JK]  1034 Case discussed with Dr. Roxane [JK]    Clinical Course User Index [JK] Randol Simmonds, MD                                 Medical Decision Making Differential diagnosis includes but not limited to COPD exacerbation, hypertensive emergency, CHF, pneumonia, pneumothorax  Problems Addressed: COPD exacerbation (HCC): acute illness or injury that poses a threat to life or bodily functions Elevated troponin: acute illness or injury that poses a threat to life or bodily functions Hypertension, unspecified type: chronic illness or injury with exacerbation, progression, or side effects of treatment  Amount and/or Complexity of Data Reviewed Labs: ordered. Decision-making details documented in ED Course. Radiology: ordered and independent interpretation performed.  Risk Prescription drug management. Decision regarding hospitalization.   Patient presented to the ED for evaluation increasing shortness of breath.  Patient does have a history of COPD and does continue to smoke.  Patient had notable wheezing on exam.  Chest x-ray did not show pneumonia or pneumothorax.  Patient's labs did show slight elevation of troponin.  BNP also mildly elevated but no signs of pulmonary edema on her x-ray.  Doubt CHF at this time.  Patient was notably hypertensive.  With treating her respiratory difficulty her blood pressure is improved.  It is possible the elevated blood pressure contributed to the elevated troponin.  Doubt ACS STEMI at this time.  Will continue to monitor.  Patient is feeling somewhat better after her breathing treatments.  Her oxygen  is normal on nasal cannula.  Do not feel BIPAP is needed at this time. She still is having wheezing and I think will require admission to the hospital for continued treatment     Final diagnoses:  COPD exacerbation (HCC)  Hypertension, unspecified type  Elevated troponin    ED Discharge Orders     None          Randol Simmonds,  MD 06/21/24 1416

## 2024-06-20 NOTE — ED Notes (Signed)
 This RN offered PRN med for work of breathing, pt declined, states she will call out when she needs it

## 2024-06-20 NOTE — ED Notes (Signed)
 EDP made aware of patient. Will assess.

## 2024-06-20 NOTE — Group Note (Deleted)
 Date:  06/20/2024 Time:  2:22 PM  Group Topic/Focus:  Wellness Toolbox:   The focus of this group is to discuss various aspects of wellness, balancing those aspects and exploring ways to increase the ability to experience wellness.  Patients will create a wellness toolbox for use upon discharge.     Participation Level:  {BHH PARTICIPATION OZCZO:77735}  Participation Quality:  {BHH PARTICIPATION QUALITY:22265}  Affect:  {BHH AFFECT:22266}  Cognitive:  {BHH COGNITIVE:22267}  Insight: {BHH Insight2:20797}  Engagement in Group:  {BHH ENGAGEMENT IN HMNLE:77731}  Modes of Intervention:  {BHH MODES OF INTERVENTION:22269}  Additional Comments:  ***  Myra Curtistine BROCKS 06/20/2024, 2:22 PM

## 2024-06-21 DIAGNOSIS — J441 Chronic obstructive pulmonary disease with (acute) exacerbation: Secondary | ICD-10-CM | POA: Diagnosis not present

## 2024-06-21 LAB — CBC
HCT: 36 % (ref 36.0–46.0)
Hemoglobin: 11.2 g/dL — ABNORMAL LOW (ref 12.0–15.0)
MCH: 28.1 pg (ref 26.0–34.0)
MCHC: 31.1 g/dL (ref 30.0–36.0)
MCV: 90.2 fL (ref 80.0–100.0)
Platelets: 374 K/uL (ref 150–400)
RBC: 3.99 MIL/uL (ref 3.87–5.11)
RDW: 14.5 % (ref 11.5–15.5)
WBC: 3.2 K/uL — ABNORMAL LOW (ref 4.0–10.5)
nRBC: 0 % (ref 0.0–0.2)

## 2024-06-21 LAB — BASIC METABOLIC PANEL WITH GFR
Anion gap: 10 (ref 5–15)
BUN: 10 mg/dL (ref 8–23)
CO2: 23 mmol/L (ref 22–32)
Calcium: 9 mg/dL (ref 8.9–10.3)
Chloride: 99 mmol/L (ref 98–111)
Creatinine, Ser: 0.64 mg/dL (ref 0.44–1.00)
GFR, Estimated: >60 mL/min (ref >60)
Glucose, Bld: 162 mg/dL — ABNORMAL HIGH (ref 70–99)
Potassium: 4 mmol/L (ref 3.5–5.1)
Sodium: 132 mmol/L — ABNORMAL LOW (ref 135–145)

## 2024-06-21 MED ORDER — REVEFENACIN 175 MCG/3ML IN SOLN: 175.0000 ug | Freq: Every day | RESPIRATORY_TRACT | Status: DC

## 2024-06-21 MED ORDER — IPRATROPIUM-ALBUTEROL 0.5-2.5 (3) MG/3ML IN SOLN
3.0000 mL | Freq: Three times a day (TID) | RESPIRATORY_TRACT | Status: DC
  Administered 2024-06-22 – 2024-06-23 (×4): 3 mL via RESPIRATORY_TRACT
  Filled 2024-06-21 (×4): qty 3

## 2024-06-21 MED ORDER — IPRATROPIUM-ALBUTEROL 0.5-2.5 (3) MG/3ML IN SOLN
3.0000 mL | Freq: Four times a day (QID) | RESPIRATORY_TRACT | Status: DC
  Administered 2024-06-21 (×3): 3 mL via RESPIRATORY_TRACT
  Filled 2024-06-21 (×3): qty 3

## 2024-06-21 MED ORDER — BUDESONIDE 0.25 MG/2ML IN SUSP
0.2500 mg | Freq: Two times a day (BID) | RESPIRATORY_TRACT | Status: DC
  Administered 2024-06-21 – 2024-06-25 (×8): 0.25 mg via RESPIRATORY_TRACT
  Filled 2024-06-21 (×8): qty 2

## 2024-06-21 MED ORDER — AZITHROMYCIN 250 MG PO TABS
500.0000 mg | ORAL_TABLET | Freq: Every day | ORAL | Status: AC
  Administered 2024-06-21 – 2024-06-25 (×5): 500 mg via ORAL
  Filled 2024-06-21 (×5): qty 2

## 2024-06-21 NOTE — Progress Notes (Signed)
 Transition of Care [facility]) - Inpatient Brief Assessment  Patient Details  Name: [name] MRN: [id number] Date of Birth: 10/11/1951  Transition of Care [facility]) CM/SW Contact:    Duwaine GORMAN Aran, LCSW Phone Number: 06/21/2024, 1:08 PM  Clinical Narrative: Screening completed. No care management needs identified at this time.  Transition of Care Asessment: Insurance and Status: Insurance coverage has been reviewed Patient has primary care physician: Yes Home environment has been reviewed: Resides in single family home with spouse Prior level of function:: Independent with ADLs Prior/Current Home Services: No current home services Social Drivers of Health Review: SDOH reviewed no interventions necessary Readmission risk has been reviewed: Yes Transition of care needs: no transition of care needs at this time   06/21/24 1308  Readmission Prevention Plan - High Risk  Transportation Screening Complete  Home Care Consult (High Risk) Complete  High Risk Social Work Consult for recovery care planning/counseling (includes patient and caregiver) Complete  High Risk Palliative Care Screening Not Applicable  Medication Review Complete

## 2024-06-21 NOTE — Progress Notes (Addendum)
 PROGRESS NOTE  [name] FMW:[id number] DOB: 10-11-1951 DOA: 06/20/2024 PCP: Elnora Ip, MD  HPI/Recap of past 24 hours: [name] is a 73 y.o. female with medical history significant for hypertension, COPD on room air, ongoing tobacco abuse multiple hospital admissions for acute exacerbation of COPD last discharged on 7/22 returns with 2 days of congestion, cough, dyspnea and shortness of breath being admitted to the hospital with recurrent acute exacerbation of COPD.  The patient states that she continues to smoke about 1 pack of cigarettes per day, she has been stable at home for the last few weeks without any respiratory symptoms.  Patient admitted for further management.    Today, patient reports feeling slightly better, denies any worsening shortness of breath, cough, denies any chest pain.  Still noted to be wheezing.   Assessment/Plan: Active Problems:   COPD with acute exacerbation (HCC)   Acute exacerbation of COPD Initially on 2 L of O2, currently on room air Still wheezing Respiratory viral panel negative Chest x-ray with no active cardiopulmonary disease Continue DuoNebs, Pulmicort  Continue Solu-Medrol  IV Continue supplemental O2, prn Incentive spirometer and flutter valve Continue daily Claritin , Flonase  Monitor closely  Elevated BNP Does not appear overloaded Last echo done on 04/19/2024 showed EF greater than 75%, hyperdynamic function, no regional wall motion abnormality Monitor closely on telemetry  Abnormal troponin Initial troponin 41--> 29 , without acute EKG changes or complaints of chest pain Telemetry   Hypertension Continue home Lopressor , amlodipine , Hyzaar   GERD Protonix  daily  Tobacco abuse Still smokes about a pack of cigarettes per day Advised to quit States nicotine  patch makes her nauseous, refusing    Estimated body mass index is 29.78 kg/m as calculated from the following:   Height as of this  encounter: 5' 6 (1.676 m).   Weight as of this encounter: 83.7 kg.     Code Status: Full  Family Communication: None at bedside  Disposition Plan: Status is: Inpatient Remains inpatient appropriate because: Level of care      Consultants: None  Procedures: None  Antimicrobials: Azithromycin   DVT prophylaxis: Lovenox    Objective: Vitals:   06/21/24 0601 06/21/24 0850 06/21/24 1348 06/21/24 1349  BP: (!) 164/76   (!) 160/64  Pulse: 80   76  Resp: 18   19  Temp: 98.3 F (36.8 C)   98.9 F (37.2 C)  TempSrc: Oral   Oral  SpO2: 98% 96% 99% 100%  Weight:      Height:        Intake/Output Summary (Last 24 hours) at 06/21/2024 1555 Last data filed at 06/21/2024 1013 Gross per 24 hour  Intake 680 ml  Output 650 ml  Net 30 ml   Filed Weights   06/20/24 0808 06/20/24 1719  Weight: 72.6 kg 83.7 kg    Exam: General: NAD  Cardiovascular: S1, S2 present Respiratory: Expiratory wheezing noted bilaterally Abdomen: Soft, nontender, nondistended, bowel sounds present Musculoskeletal: No bilateral pedal edema noted Skin: Normal Psychiatry: Normal mood     Data Reviewed: CBC: Recent Labs  Lab 06/20/24 0810 06/21/24 0355  WBC 4.1 3.2*  HGB 11.9* 11.2*  HCT 36.8 36.0  MCV 90.2 90.2  PLT 370 374   Basic Metabolic Panel: Recent Labs  Lab 06/20/24 0810 06/21/24 0355  NA 137 132*  K 3.6 4.0  CL 105 99  CO2 23 23  GLUCOSE 120* 162*  BUN 6* 10  CREATININE 0.65 0.64  CALCIUM  8.8* 9.0   GFR: Estimated  Creatinine Clearance: 68.3 mL/min (by C-G formula based on SCr of 0.64 mg/dL). Liver Function Tests: No results for input(s): AST, ALT, ALKPHOS, BILITOT, PROT, ALBUMIN in the last 168 hours. No results for input(s): LIPASE, AMYLASE in the last 168 hours. No results for input(s): AMMONIA in the last 168 hours. Coagulation Profile: No results for input(s): INR, PROTIME in the last 168 hours. Cardiac Enzymes: No results for  input(s): CKTOTAL, CKMB, CKMBINDEX, TROPONINI in the last 168 hours. BNP (last 3 results) No results for input(s): PROBNP in the last 8760 hours. HbA1C: No results for input(s): HGBA1C in the last 72 hours. CBG: No results for input(s): GLUCAP in the last 168 hours. Lipid Profile: No results for input(s): CHOL, HDL, LDLCALC, TRIG, CHOLHDL, LDLDIRECT in the last 72 hours. Thyroid  Function Tests: No results for input(s): TSH, T4TOTAL, FREET4, T3FREE, THYROIDAB in the last 72 hours. Anemia Panel: No results for input(s): VITAMINB12, FOLATE, FERRITIN, TIBC, IRON, RETICCTPCT in the last 72 hours. Urine analysis:    Component Value Date/Time   COLORURINE STRAW (A) 04/06/2019 1804   APPEARANCEUR CLEAR 04/06/2019 1804   LABSPEC 1.023 04/06/2019 1804   PHURINE 5.0 04/06/2019 1804   GLUCOSEU >=500 (A) 04/06/2019 1804   HGBUR NEGATIVE 04/06/2019 1804   BILIRUBINUR NEGATIVE 04/06/2019 1804   KETONESUR 5 (A) 04/06/2019 1804   PROTEINUR NEGATIVE 04/06/2019 1804   UROBILINOGEN 1.0 02/15/2012 1208   NITRITE NEGATIVE 04/06/2019 1804   LEUKOCYTESUR NEGATIVE 04/06/2019 1804   Sepsis Labs: @LABRCNTIP (procalcitonin:4,lacticidven:4)  ) Recent Results (from the past 240 hours)  Resp panel by RT-PCR (RSV, Flu A&B, Covid) Anterior Nasal Swab     Status: None   Collection Time: 06/20/24 12:48 PM   Specimen: Anterior Nasal Swab  Result Value Ref Range Status   SARS Coronavirus 2 by RT PCR NEGATIVE NEGATIVE Final    Comment: (NOTE) SARS-CoV-2 target nucleic acids are NOT DETECTED.  The SARS-CoV-2 RNA is generally detectable in upper respiratory specimens during the acute phase of infection. The lowest concentration of SARS-CoV-2 viral copies this assay can detect is 138 copies/mL. A negative result does not preclude SARS-Cov-2 infection and should not be used as the sole basis for treatment or other patient management decisions. A negative result may  occur with  improper specimen collection/handling, submission of specimen other than nasopharyngeal swab, presence of viral mutation(s) within the areas targeted by this assay, and inadequate number of viral copies(<138 copies/mL). A negative result must be combined with clinical observations, patient history, and epidemiological information. The expected result is Negative.  Fact Sheet for Patients:  BloggerCourse.com  Fact Sheet for Healthcare Providers:  SeriousBroker.it  This test is no t yet approved or cleared by the [place]  FDA and  has been authorized for detection and/or diagnosis of SARS-CoV-2 by FDA under an Emergency Use Authorization (EUA). This EUA will remain  in effect (meaning this test can be used) for the duration of the COVID-19 declaration under Section 564(b)(1) of the Act, 21 U.S.C.section 360bbb-3(b)(1), unless the authorization is terminated  or revoked sooner.       Influenza A by PCR NEGATIVE NEGATIVE Final   Influenza B by PCR NEGATIVE NEGATIVE Final    Comment: (NOTE) The Xpert Xpress SARS-CoV-2/FLU/RSV plus assay is intended as an aid in the diagnosis of influenza from Nasopharyngeal swab specimens and should not be used as a sole basis for treatment. Nasal washings and aspirates are unacceptable for Xpert Xpress SARS-CoV-2/FLU/RSV testing.  Fact Sheet for Patients: BloggerCourse.com  Fact Sheet  for Healthcare Providers: SeriousBroker.it  This test is not yet approved or cleared by the [place]  FDA and has been authorized for detection and/or diagnosis of SARS-CoV-2 by FDA under an Emergency Use Authorization (EUA). This EUA will remain in effect (meaning this test can be used) for the duration of the COVID-19 declaration under Section 564(b)(1) of the Act, 21 U.S.C. section 360bbb-3(b)(1), unless the authorization is terminated  or revoked.     Resp Syncytial Virus by PCR NEGATIVE NEGATIVE Final    Comment: (NOTE) Fact Sheet for Patients: BloggerCourse.com  Fact Sheet for Healthcare Providers: SeriousBroker.it  This test is not yet approved or cleared by the [place]  FDA and has been authorized for detection and/or diagnosis of SARS-CoV-2 by FDA under an Emergency Use Authorization (EUA). This EUA will remain in effect (meaning this test can be used) for the duration of the COVID-19 declaration under Section 564(b)(1) of the Act, 21 U.S.C. section 360bbb-3(b)(1), unless the authorization is terminated or revoked.  Performed at [facility], [address] [place], [place] [address]       Studies: No results found.  Scheduled Meds:  amLODipine   10 mg Oral Daily   enoxaparin  (LOVENOX ) injection  40 mg Subcutaneous Q24H   fluticasone   2 spray Each Nare Daily   fluticasone  furoate-vilanterol  1 puff Inhalation Daily   losartan   50 mg Oral Daily   And   hydrochlorothiazide   12.5 mg Oral Daily   ipratropium-albuterol   3 mL Nebulization Q6H   methylPREDNISolone  (SOLU-MEDROL ) injection  40 mg Intravenous Q12H   metoprolol  tartrate  50 mg Oral BID   pantoprazole   40 mg Oral Daily    Continuous Infusions:   LOS: 1 day     Lebron JINNY Cage, MD [facility]  If 7PM-7AM, please contact night-coverage [email or web address] 06/21/2024, 3:55 PM

## 2024-06-22 DIAGNOSIS — J441 Chronic obstructive pulmonary disease with (acute) exacerbation: Secondary | ICD-10-CM | POA: Diagnosis not present

## 2024-06-22 LAB — CBC WITH DIFFERENTIAL/PLATELET
Abs Immature Granulocytes: 0.02 K/uL (ref 0.00–0.07)
Basophils Absolute: 0 K/uL (ref 0.0–0.1)
Basophils Relative: 0 %
Eosinophils Absolute: 0 K/uL (ref 0.0–0.5)
Eosinophils Relative: 0 %
HCT: 37.5 % (ref 36.0–46.0)
Hemoglobin: 11.5 g/dL — ABNORMAL LOW (ref 12.0–15.0)
Immature Granulocytes: 0 %
Lymphocytes Relative: 10 %
Lymphs Abs: 0.7 K/uL (ref 0.7–4.0)
MCH: 28.3 pg (ref 26.0–34.0)
MCHC: 30.7 g/dL (ref 30.0–36.0)
MCV: 92.1 fL (ref 80.0–100.0)
Monocytes Absolute: 0.2 K/uL (ref 0.1–1.0)
Monocytes Relative: 3 %
Neutro Abs: 5.7 K/uL (ref 1.7–7.7)
Neutrophils Relative %: 87 %
Platelets: 371 K/uL (ref 150–400)
RBC: 4.07 MIL/uL (ref 3.87–5.11)
RDW: 14.6 % (ref 11.5–15.5)
WBC: 6.5 K/uL (ref 4.0–10.5)
nRBC: 0 % (ref 0.0–0.2)

## 2024-06-22 LAB — BASIC METABOLIC PANEL WITH GFR
Anion gap: 10 (ref 5–15)
BUN: 18 mg/dL (ref 8–23)
CO2: 25 mmol/L (ref 22–32)
Calcium: 9.1 mg/dL (ref 8.9–10.3)
Chloride: 101 mmol/L (ref 98–111)
Creatinine, Ser: 0.79 mg/dL (ref 0.44–1.00)
GFR, Estimated: >60 mL/min (ref >60)
Glucose, Bld: 155 mg/dL — ABNORMAL HIGH (ref 70–99)
Potassium: 4.3 mmol/L (ref 3.5–5.1)
Sodium: 136 mmol/L (ref 135–145)

## 2024-06-22 MED ORDER — PREDNISONE 50 MG PO TABS
50.0000 mg | ORAL_TABLET | Freq: Every day | ORAL | Status: DC
  Administered 2024-06-23 – 2024-06-25 (×3): 50 mg via ORAL
  Filled 2024-06-22 (×3): qty 1

## 2024-06-22 NOTE — Progress Notes (Signed)
 PROGRESS NOTE  [name] FMW:[id number] DOB: 02-12-1951 DOA: 06/20/2024 PCP: Elnora Ip, MD  HPI/Recap of past 24 hours: [name] is a 73 y.o. female with medical history significant for hypertension, COPD on room air, ongoing tobacco abuse multiple hospital admissions for acute exacerbation of COPD last discharged on 7/22 returns with 2 days of congestion, cough, dyspnea and shortness of breath being admitted to the hospital with recurrent acute exacerbation of COPD.  The patient states that she continues to smoke about 1 pack of cigarettes per day, she has been stable at home for the last few weeks without any respiratory symptoms.  Patient admitted for further management.    Today, patient still noted to be mildly wheezing, slowly improving, still with persistent cough.    Assessment/Plan: Active Problems:   COPD with acute exacerbation (HCC)   Acute exacerbation of COPD Initially on 2 L of O2, currently on room air Respiratory viral panel negative Chest x-ray with no active cardiopulmonary disease Continue DuoNebs, Pulmicort  Switch IV Solu-Medrol  to p.o. prednisone , plan for a 2-week taper upon discharge Continue supplemental O2, prn Incentive spirometer and flutter valve Continue daily Claritin , Flonase  Monitor closely  Elevated BNP Does not appear overloaded Last echo done on 04/19/2024 showed EF greater than 75%, hyperdynamic function, no regional wall motion abnormality Monitor closely on telemetry  Abnormal troponin Initial troponin 41--> 29 , without acute EKG changes or complaints of chest pain Telemetry   Hypertension Continue home Lopressor , amlodipine , Hyzaar   GERD Protonix  daily  Tobacco abuse Still smokes about a pack of cigarettes per day Advised to quit States nicotine  patch makes her nauseous, refusing    Estimated body mass index is 29.78 kg/m as calculated from the following:   Height as of this encounter: 5' 6  (1.676 m).   Weight as of this encounter: 83.7 kg.     Code Status: Full  Family Communication: None at bedside  Disposition Plan: Status is: Inpatient Remains inpatient appropriate because: Level of care      Consultants: None  Procedures: None  Antimicrobials: Azithromycin   DVT prophylaxis: Lovenox    Objective: Vitals:   06/22/24 0807 06/22/24 0859 06/22/24 1411 06/22/24 1424  BP:  (!) 145/62 (!) 155/72   Pulse:  85 81   Resp:   19   Temp:   98.6 F (37 C)   TempSrc:   Oral   SpO2: 94%  100% 100%  Weight:      Height:        Intake/Output Summary (Last 24 hours) at 06/22/2024 1756 Last data filed at 06/21/2024 2200 Gross per 24 hour  Intake 400 ml  Output --  Net 400 ml   Filed Weights   06/20/24 0808 06/20/24 1719  Weight: 72.6 kg 83.7 kg    Exam: General: NAD  Cardiovascular: S1, S2 present Respiratory: Mild expiratory wheezing noted bilaterally Abdomen: Soft, nontender, nondistended, bowel sounds present Musculoskeletal: No bilateral pedal edema noted Skin: Normal Psychiatry: Normal mood     Data Reviewed: CBC: Recent Labs  Lab 06/20/24 0810 06/21/24 0355 06/22/24 0341  WBC 4.1 3.2* 6.5  NEUTROABS  --   --  5.7  HGB 11.9* 11.2* 11.5*  HCT 36.8 36.0 37.5  MCV 90.2 90.2 92.1  PLT 370 374 371   Basic Metabolic Panel: Recent Labs  Lab 06/20/24 0810 06/21/24 0355 06/22/24 0341  NA 137 132* 136  K 3.6 4.0 4.3  CL 105 99 101  CO2 23 23 25  GLUCOSE 120* 162* 155*  BUN 6* 10 18  CREATININE 0.65 0.64 0.79  CALCIUM  8.8* 9.0 9.1   GFR: Estimated Creatinine Clearance: 68.3 mL/min (by C-G formula based on SCr of 0.79 mg/dL). Liver Function Tests: No results for input(s): AST, ALT, ALKPHOS, BILITOT, PROT, ALBUMIN in the last 168 hours. No results for input(s): LIPASE, AMYLASE in the last 168 hours. No results for input(s): AMMONIA in the last 168 hours. Coagulation Profile: No results for input(s): INR,  PROTIME in the last 168 hours. Cardiac Enzymes: No results for input(s): CKTOTAL, CKMB, CKMBINDEX, TROPONINI in the last 168 hours. BNP (last 3 results) No results for input(s): PROBNP in the last 8760 hours. HbA1C: No results for input(s): HGBA1C in the last 72 hours. CBG: No results for input(s): GLUCAP in the last 168 hours. Lipid Profile: No results for input(s): CHOL, HDL, LDLCALC, TRIG, CHOLHDL, LDLDIRECT in the last 72 hours. Thyroid  Function Tests: No results for input(s): TSH, T4TOTAL, FREET4, T3FREE, THYROIDAB in the last 72 hours. Anemia Panel: No results for input(s): VITAMINB12, FOLATE, FERRITIN, TIBC, IRON, RETICCTPCT in the last 72 hours. Urine analysis:    Component Value Date/Time   COLORURINE STRAW (A) 04/06/2019 1804   APPEARANCEUR CLEAR 04/06/2019 1804   LABSPEC 1.023 04/06/2019 1804   PHURINE 5.0 04/06/2019 1804   GLUCOSEU >=500 (A) 04/06/2019 1804   HGBUR NEGATIVE 04/06/2019 1804   BILIRUBINUR NEGATIVE 04/06/2019 1804   KETONESUR 5 (A) 04/06/2019 1804   PROTEINUR NEGATIVE 04/06/2019 1804   UROBILINOGEN 1.0 02/15/2012 1208   NITRITE NEGATIVE 04/06/2019 1804   LEUKOCYTESUR NEGATIVE 04/06/2019 1804   Sepsis Labs: @LABRCNTIP (procalcitonin:4,lacticidven:4)  ) Recent Results (from the past 240 hours)  Resp panel by RT-PCR (RSV, Flu A&B, Covid) Anterior Nasal Swab     Status: None   Collection Time: 06/20/24 12:48 PM   Specimen: Anterior Nasal Swab  Result Value Ref Range Status   SARS Coronavirus 2 by RT PCR NEGATIVE NEGATIVE Final    Comment: (NOTE) SARS-CoV-2 target nucleic acids are NOT DETECTED.  The SARS-CoV-2 RNA is generally detectable in upper respiratory specimens during the acute phase of infection. The lowest concentration of SARS-CoV-2 viral copies this assay can detect is 138 copies/mL. A negative result does not preclude SARS-Cov-2 infection and should not be used as the sole basis for  treatment or other patient management decisions. A negative result may occur with  improper specimen collection/handling, submission of specimen other than nasopharyngeal swab, presence of viral mutation(s) within the areas targeted by this assay, and inadequate number of viral copies(<138 copies/mL). A negative result must be combined with clinical observations, patient history, and epidemiological information. The expected result is Negative.  Fact Sheet for Patients:  BloggerCourse.com  Fact Sheet for Healthcare Providers:  SeriousBroker.it  This test is no t yet approved or cleared by the [place]  FDA and  has been authorized for detection and/or diagnosis of SARS-CoV-2 by FDA under an Emergency Use Authorization (EUA). This EUA will remain  in effect (meaning this test can be used) for the duration of the COVID-19 declaration under Section 564(b)(1) of the Act, 21 U.S.C.section 360bbb-3(b)(1), unless the authorization is terminated  or revoked sooner.       Influenza A by PCR NEGATIVE NEGATIVE Final   Influenza B by PCR NEGATIVE NEGATIVE Final    Comment: (NOTE) The Xpert Xpress SARS-CoV-2/FLU/RSV plus assay is intended as an aid in the diagnosis of influenza from Nasopharyngeal swab specimens and should not be used as a sole  basis for treatment. Nasal washings and aspirates are unacceptable for Xpert Xpress SARS-CoV-2/FLU/RSV testing.  Fact Sheet for Patients: BloggerCourse.com  Fact Sheet for Healthcare Providers: SeriousBroker.it  This test is not yet approved or cleared by the [place]  FDA and has been authorized for detection and/or diagnosis of SARS-CoV-2 by FDA under an Emergency Use Authorization (EUA). This EUA will remain in effect (meaning this test can be used) for the duration of the COVID-19 declaration under Section 564(b)(1) of the Act, 21  U.S.C. section 360bbb-3(b)(1), unless the authorization is terminated or revoked.     Resp Syncytial Virus by PCR NEGATIVE NEGATIVE Final    Comment: (NOTE) Fact Sheet for Patients: BloggerCourse.com  Fact Sheet for Healthcare Providers: SeriousBroker.it  This test is not yet approved or cleared by the [place]  FDA and has been authorized for detection and/or diagnosis of SARS-CoV-2 by FDA under an Emergency Use Authorization (EUA). This EUA will remain in effect (meaning this test can be used) for the duration of the COVID-19 declaration under Section 564(b)(1) of the Act, 21 U.S.C. section 360bbb-3(b)(1), unless the authorization is terminated or revoked.  Performed at [facility], [address], [place], [place] [address]       Studies: No results found.  Scheduled Meds:  amLODipine   10 mg Oral Daily   azithromycin   500 mg Oral Daily   budesonide  (PULMICORT ) nebulizer solution  0.25 mg Nebulization BID   enoxaparin  (LOVENOX ) injection  40 mg Subcutaneous Q24H   fluticasone   2 spray Each Nare Daily   losartan   50 mg Oral Daily   And   hydrochlorothiazide   12.5 mg Oral Daily   ipratropium-albuterol   3 mL Nebulization TID   methylPREDNISolone  (SOLU-MEDROL ) injection  40 mg Intravenous Q12H   metoprolol  tartrate  50 mg Oral BID   pantoprazole   40 mg Oral Daily    Continuous Infusions:   LOS: 2 days     Lebron JINNY Cage, MD [facility]  If 7PM-7AM, please contact night-coverage [email or web address] 06/22/2024, 5:56 PM

## 2024-06-23 DIAGNOSIS — J441 Chronic obstructive pulmonary disease with (acute) exacerbation: Secondary | ICD-10-CM | POA: Diagnosis not present

## 2024-06-23 LAB — MAGNESIUM: Magnesium: 2.1 mg/dL (ref 1.7–2.4)

## 2024-06-23 LAB — BASIC METABOLIC PANEL WITH GFR
Anion gap: 12 (ref 5–15)
BUN: 18 mg/dL (ref 8–23)
CO2: 29 mmol/L (ref 22–32)
Calcium: 8.9 mg/dL (ref 8.9–10.3)
Chloride: 98 mmol/L (ref 98–111)
Creatinine, Ser: 0.84 mg/dL (ref 0.44–1.00)
GFR, Estimated: >60 mL/min (ref >60)
Glucose, Bld: 114 mg/dL — ABNORMAL HIGH (ref 70–99)
Potassium: 3.9 mmol/L (ref 3.5–5.1)
Sodium: 139 mmol/L (ref 135–145)

## 2024-06-23 LAB — PHOSPHORUS: Phosphorus: 2.9 mg/dL (ref 2.5–4.6)

## 2024-06-23 MED ORDER — IPRATROPIUM-ALBUTEROL 0.5-2.5 (3) MG/3ML IN SOLN
3.0000 mL | Freq: Two times a day (BID) | RESPIRATORY_TRACT | Status: DC
  Administered 2024-06-23 – 2024-06-25 (×4): 3 mL via RESPIRATORY_TRACT
  Filled 2024-06-23 (×4): qty 3

## 2024-06-23 MED ORDER — HYDROXYZINE HCL 10 MG PO TABS
10.0000 mg | ORAL_TABLET | Freq: Three times a day (TID) | ORAL | Status: DC | PRN
  Administered 2024-06-23 – 2024-06-25 (×5): 10 mg via ORAL
  Filled 2024-06-23 (×5): qty 1

## 2024-06-23 NOTE — Progress Notes (Signed)
 PROGRESS NOTE  [name] FMW:[id number] DOB: 1951-09-26 DOA: 06/20/2024 PCP: Elnora Ip, MD  HPI/Recap of past 24 hours: [name] is a 73 y.o. female with medical history significant for hypertension, COPD on room air, ongoing tobacco abuse multiple hospital admissions for acute exacerbation of COPD last discharged on 7/22 returns with 2 days of congestion, cough, dyspnea and shortness of breath being admitted to the hospital with recurrent acute exacerbation of COPD.  The patient states that she continues to smoke about 1 pack of cigarettes per day, she has been stable at home for the last few weeks without any respiratory symptoms.  Patient admitted for further management.    Today, patient still noted to be wheezing.   Assessment/Plan: Active Problems:   COPD with acute exacerbation (HCC)   Acute exacerbation of COPD Initially on 2 L of O2, currently on room air Respiratory viral panel negative Chest x-ray with no active cardiopulmonary disease Continue DuoNebs, Pulmicort  Switch IV Solu-Medrol  to p.o. prednisone , plan for a 2-week taper upon discharge Continue supplemental O2, prn Incentive spirometer and flutter valve Continue daily Claritin , Flonase  Monitor closely  Elevated BNP Does not appear overloaded Last echo done on 04/19/2024 showed EF greater than 75%, hyperdynamic function, no regional wall motion abnormality Monitor closely on telemetry  Abnormal troponin Initial troponin 41--> 29 , without acute EKG changes or complaints of chest pain Telemetry   Hypertension Continue home Lopressor , amlodipine , Hyzaar   GERD Protonix  daily  Tobacco abuse Still smokes about a pack of cigarettes per day Advised to quit States nicotine  patch makes her nauseous, refusing    Estimated body mass index is 29.78 kg/m as calculated from the following:   Height as of this encounter: 5' 6 (1.676 m).   Weight as of this encounter: 83.7 kg.      Code Status: Full  Family Communication: None at bedside  Disposition Plan: Status is: Inpatient Remains inpatient appropriate because: Level of care      Consultants: None  Procedures: None  Antimicrobials: Azithromycin   DVT prophylaxis: Lovenox    Objective: Vitals:   06/23/24 0251 06/23/24 0525 06/23/24 0843 06/23/24 1206  BP: (!) 149/65 135/65  (!) 145/69  Pulse: 64 70  72  Resp: 18 16  (!) 21  Temp:  98.3 F (36.8 C)  97.9 F (36.6 C)  TempSrc:  Oral  Oral  SpO2:  99% 99% 97%  Weight:      Height:        Intake/Output Summary (Last 24 hours) at 06/23/2024 1847 Last data filed at 06/23/2024 1709 Gross per 24 hour  Intake 1420 ml  Output --  Net 1420 ml   Filed Weights   06/20/24 0808 06/20/24 1719  Weight: 72.6 kg 83.7 kg    Exam: General: NAD  Cardiovascular: S1, S2 present Respiratory: Mild expiratory wheezing noted bilaterally Abdomen: Soft, nontender, nondistended, bowel sounds present Musculoskeletal: No bilateral pedal edema noted Skin: Normal Psychiatry: Normal mood     Data Reviewed: CBC: Recent Labs  Lab 06/20/24 0810 06/21/24 0355 06/22/24 0341  WBC 4.1 3.2* 6.5  NEUTROABS  --   --  5.7  HGB 11.9* 11.2* 11.5*  HCT 36.8 36.0 37.5  MCV 90.2 90.2 92.1  PLT 370 374 371   Basic Metabolic Panel: Recent Labs  Lab 06/20/24 0810 06/21/24 0355 06/22/24 0341 06/23/24 0622 06/23/24 0623  NA 137 132* 136 139  --   K 3.6 4.0 4.3 3.9  --   CL 105  99 101 98  --   CO2 23 23 25 29   --   GLUCOSE 120* 162* 155* 114*  --   BUN 6* 10 18 18   --   CREATININE 0.65 0.64 0.79 0.84  --   CALCIUM  8.8* 9.0 9.1 8.9  --   MG  --   --   --   --  2.1  PHOS  --   --   --   --  2.9   GFR: Estimated Creatinine Clearance: 65.1 mL/min (by C-G formula based on SCr of 0.84 mg/dL). Liver Function Tests: No results for input(s): AST, ALT, ALKPHOS, BILITOT, PROT, ALBUMIN in the last 168 hours. No results for input(s): LIPASE,  AMYLASE in the last 168 hours. No results for input(s): AMMONIA in the last 168 hours. Coagulation Profile: No results for input(s): INR, PROTIME in the last 168 hours. Cardiac Enzymes: No results for input(s): CKTOTAL, CKMB, CKMBINDEX, TROPONINI in the last 168 hours. BNP (last 3 results) No results for input(s): PROBNP in the last 8760 hours. HbA1C: No results for input(s): HGBA1C in the last 72 hours. CBG: No results for input(s): GLUCAP in the last 168 hours. Lipid Profile: No results for input(s): CHOL, HDL, LDLCALC, TRIG, CHOLHDL, LDLDIRECT in the last 72 hours. Thyroid  Function Tests: No results for input(s): TSH, T4TOTAL, FREET4, T3FREE, THYROIDAB in the last 72 hours. Anemia Panel: No results for input(s): VITAMINB12, FOLATE, FERRITIN, TIBC, IRON, RETICCTPCT in the last 72 hours. Urine analysis:    Component Value Date/Time   COLORURINE STRAW (A) 04/06/2019 1804   APPEARANCEUR CLEAR 04/06/2019 1804   LABSPEC 1.023 04/06/2019 1804   PHURINE 5.0 04/06/2019 1804   GLUCOSEU >=500 (A) 04/06/2019 1804   HGBUR NEGATIVE 04/06/2019 1804   BILIRUBINUR NEGATIVE 04/06/2019 1804   KETONESUR 5 (A) 04/06/2019 1804   PROTEINUR NEGATIVE 04/06/2019 1804   UROBILINOGEN 1.0 02/15/2012 1208   NITRITE NEGATIVE 04/06/2019 1804   LEUKOCYTESUR NEGATIVE 04/06/2019 1804   Sepsis Labs: @LABRCNTIP (procalcitonin:4,lacticidven:4)  ) Recent Results (from the past 240 hours)  Resp panel by RT-PCR (RSV, Flu A&B, Covid) Anterior Nasal Swab     Status: None   Collection Time: 06/20/24 12:48 PM   Specimen: Anterior Nasal Swab  Result Value Ref Range Status   SARS Coronavirus 2 by RT PCR NEGATIVE NEGATIVE Final    Comment: (NOTE) SARS-CoV-2 target nucleic acids are NOT DETECTED.  The SARS-CoV-2 RNA is generally detectable in upper respiratory specimens during the acute phase of infection. The lowest concentration of SARS-CoV-2 viral copies  this assay can detect is 138 copies/mL. A negative result does not preclude SARS-Cov-2 infection and should not be used as the sole basis for treatment or other patient management decisions. A negative result may occur with  improper specimen collection/handling, submission of specimen other than nasopharyngeal swab, presence of viral mutation(s) within the areas targeted by this assay, and inadequate number of viral copies(<138 copies/mL). A negative result must be combined with clinical observations, patient history, and epidemiological information. The expected result is Negative.  Fact Sheet for Patients:  BloggerCourse.com  Fact Sheet for Healthcare Providers:  SeriousBroker.it  This test is no t yet approved or cleared by the [place]  FDA and  has been authorized for detection and/or diagnosis of SARS-CoV-2 by FDA under an Emergency Use Authorization (EUA). This EUA will remain  in effect (meaning this test can be used) for the duration of the COVID-19 declaration under Section 564(b)(1) of the Act, 21 U.S.C.section 360bbb-3(b)(1), unless the  authorization is terminated  or revoked sooner.       Influenza A by PCR NEGATIVE NEGATIVE Final   Influenza B by PCR NEGATIVE NEGATIVE Final    Comment: (NOTE) The Xpert Xpress SARS-CoV-2/FLU/RSV plus assay is intended as an aid in the diagnosis of influenza from Nasopharyngeal swab specimens and should not be used as a sole basis for treatment. Nasal washings and aspirates are unacceptable for Xpert Xpress SARS-CoV-2/FLU/RSV testing.  Fact Sheet for Patients: BloggerCourse.com  Fact Sheet for Healthcare Providers: SeriousBroker.it  This test is not yet approved or cleared by the [place]  FDA and has been authorized for detection and/or diagnosis of SARS-CoV-2 by FDA under an Emergency Use Authorization (EUA). This EUA  will remain in effect (meaning this test can be used) for the duration of the COVID-19 declaration under Section 564(b)(1) of the Act, 21 U.S.C. section 360bbb-3(b)(1), unless the authorization is terminated or revoked.     Resp Syncytial Virus by PCR NEGATIVE NEGATIVE Final    Comment: (NOTE) Fact Sheet for Patients: BloggerCourse.com  Fact Sheet for Healthcare Providers: SeriousBroker.it  This test is not yet approved or cleared by the [place]  FDA and has been authorized for detection and/or diagnosis of SARS-CoV-2 by FDA under an Emergency Use Authorization (EUA). This EUA will remain in effect (meaning this test can be used) for the duration of the COVID-19 declaration under Section 564(b)(1) of the Act, 21 U.S.C. section 360bbb-3(b)(1), unless the authorization is terminated or revoked.  Performed at [facility], [address] [place], [place] [address]       Studies: No results found.  Scheduled Meds:  amLODipine   10 mg Oral Daily   azithromycin   500 mg Oral Daily   budesonide  (PULMICORT ) nebulizer solution  0.25 mg Nebulization BID   enoxaparin  (LOVENOX ) injection  40 mg Subcutaneous Q24H   fluticasone   2 spray Each Nare Daily   losartan   50 mg Oral Daily   And   hydrochlorothiazide   12.5 mg Oral Daily   ipratropium-albuterol   3 mL Nebulization BID   metoprolol  tartrate  50 mg Oral BID   pantoprazole   40 mg Oral Daily   predniSONE   50 mg Oral Q breakfast    Continuous Infusions:   LOS: 3 days     Lebron JINNY Cage, MD [facility]  If 7PM-7AM, please contact night-coverage [email or web address] 06/23/2024, 6:47 PM

## 2024-06-24 DIAGNOSIS — J441 Chronic obstructive pulmonary disease with (acute) exacerbation: Secondary | ICD-10-CM | POA: Diagnosis not present

## 2024-06-24 NOTE — Progress Notes (Signed)
 PROGRESS NOTE  [name] FMW:[id number] DOB: 28-Jul-1951 DOA: 06/20/2024 PCP: Elnora Ip, MD  HPI/Recap of past 24 hours: [name] is a 73 y.o. female with medical history significant for hypertension, COPD on room air, ongoing tobacco abuse multiple hospital admissions for acute exacerbation of COPD last discharged on 7/22 returns with 2 days of congestion, cough, dyspnea and shortness of breath being admitted to the hospital with recurrent acute exacerbation of COPD.  The patient states that she continues to smoke about 1 pack of cigarettes per day, she has been stable at home for the last few weeks without any respiratory symptoms.  Patient admitted for further management.    Today, patient still wheezing.   Assessment/Plan: Active Problems:   COPD with acute exacerbation (HCC)   Acute exacerbation of COPD Initially on 2 L of O2, currently on room air Respiratory viral panel negative Chest x-ray with no active cardiopulmonary disease Continue DuoNebs, Pulmicort  Switch IV Solu-Medrol  to p.o. prednisone , plan for a 2-week taper upon discharge Continue supplemental O2, prn Incentive spirometer and flutter valve Continue daily Claritin , Flonase  Monitor closely  Elevated BNP Does not appear overloaded Last echo done on 04/19/2024 showed EF greater than 75%, hyperdynamic function, no regional wall motion abnormality Monitor closely on telemetry  Abnormal troponin Initial troponin 41--> 29 , without acute EKG changes or complaints of chest pain Telemetry   Hypertension Continue home Lopressor , amlodipine , Hyzaar   GERD Protonix  daily  Tobacco abuse Still smokes about a pack of cigarettes per day Advised to quit States nicotine  patch makes her nauseous, refusing    Estimated body mass index is 29.78 kg/m as calculated from the following:   Height as of this encounter: 5' 6 (1.676 m).   Weight as of this encounter: 83.7 kg.     Code  Status: Full  Family Communication: None at bedside  Disposition Plan: Status is: Inpatient Remains inpatient appropriate because: Level of care      Consultants: None  Procedures: None  Antimicrobials: Azithromycin   DVT prophylaxis: Lovenox    Objective: Vitals:   06/23/24 2029 06/24/24 0548 06/24/24 0812 06/24/24 1335  BP: (!) 137/52 (!) 150/70  (!) 148/56  Pulse: 77 60  75  Resp: (!) 22 18  18   Temp: 98.3 F (36.8 C) 98.7 F (37.1 C)  98.3 F (36.8 C)  TempSrc: Oral Oral  Oral  SpO2: 100% 100% 99% 100%  Weight:      Height:        Intake/Output Summary (Last 24 hours) at 06/24/2024 1645 Last data filed at 06/24/2024 1330 Gross per 24 hour  Intake 900 ml  Output --  Net 900 ml   Filed Weights   06/20/24 0808 06/20/24 1719  Weight: 72.6 kg 83.7 kg    Exam: General: NAD  Cardiovascular: S1, S2 present Respiratory: Mild expiratory wheezing noted bilaterally Abdomen: Soft, nontender, nondistended, bowel sounds present Musculoskeletal: No bilateral pedal edema noted Skin: Normal Psychiatry: Normal mood     Data Reviewed: CBC: Recent Labs  Lab 06/20/24 0810 06/21/24 0355 06/22/24 0341  WBC 4.1 3.2* 6.5  NEUTROABS  --   --  5.7  HGB 11.9* 11.2* 11.5*  HCT 36.8 36.0 37.5  MCV 90.2 90.2 92.1  PLT 370 374 371   Basic Metabolic Panel: Recent Labs  Lab 06/20/24 0810 06/21/24 0355 06/22/24 0341 06/23/24 0622 06/23/24 0623  NA 137 132* 136 139  --   K 3.6 4.0 4.3 3.9  --   CL  105 99 101 98  --   CO2 23 23 25 29   --   GLUCOSE 120* 162* 155* 114*  --   BUN 6* 10 18 18   --   CREATININE 0.65 0.64 0.79 0.84  --   CALCIUM  8.8* 9.0 9.1 8.9  --   MG  --   --   --   --  2.1  PHOS  --   --   --   --  2.9   GFR: Estimated Creatinine Clearance: 65.1 mL/min (by C-G formula based on SCr of 0.84 mg/dL). Liver Function Tests: No results for input(s): AST, ALT, ALKPHOS, BILITOT, PROT, ALBUMIN in the last 168 hours. No results for  input(s): LIPASE, AMYLASE in the last 168 hours. No results for input(s): AMMONIA in the last 168 hours. Coagulation Profile: No results for input(s): INR, PROTIME in the last 168 hours. Cardiac Enzymes: No results for input(s): CKTOTAL, CKMB, CKMBINDEX, TROPONINI in the last 168 hours. BNP (last 3 results) No results for input(s): PROBNP in the last 8760 hours. HbA1C: No results for input(s): HGBA1C in the last 72 hours. CBG: No results for input(s): GLUCAP in the last 168 hours. Lipid Profile: No results for input(s): CHOL, HDL, LDLCALC, TRIG, CHOLHDL, LDLDIRECT in the last 72 hours. Thyroid  Function Tests: No results for input(s): TSH, T4TOTAL, FREET4, T3FREE, THYROIDAB in the last 72 hours. Anemia Panel: No results for input(s): VITAMINB12, FOLATE, FERRITIN, TIBC, IRON, RETICCTPCT in the last 72 hours. Urine analysis:    Component Value Date/Time   COLORURINE STRAW (A) 04/06/2019 1804   APPEARANCEUR CLEAR 04/06/2019 1804   LABSPEC 1.023 04/06/2019 1804   PHURINE 5.0 04/06/2019 1804   GLUCOSEU >=500 (A) 04/06/2019 1804   HGBUR NEGATIVE 04/06/2019 1804   BILIRUBINUR NEGATIVE 04/06/2019 1804   KETONESUR 5 (A) 04/06/2019 1804   PROTEINUR NEGATIVE 04/06/2019 1804   UROBILINOGEN 1.0 02/15/2012 1208   NITRITE NEGATIVE 04/06/2019 1804   LEUKOCYTESUR NEGATIVE 04/06/2019 1804   Sepsis Labs: @LABRCNTIP (procalcitonin:4,lacticidven:4)  ) Recent Results (from the past 240 hours)  Resp panel by RT-PCR (RSV, Flu A&B, Covid) Anterior Nasal Swab     Status: None   Collection Time: 06/20/24 12:48 PM   Specimen: Anterior Nasal Swab  Result Value Ref Range Status   SARS Coronavirus 2 by RT PCR NEGATIVE NEGATIVE Final    Comment: (NOTE) SARS-CoV-2 target nucleic acids are NOT DETECTED.  The SARS-CoV-2 RNA is generally detectable in upper respiratory specimens during the acute phase of infection. The lowest concentration of  SARS-CoV-2 viral copies this assay can detect is 138 copies/mL. A negative result does not preclude SARS-Cov-2 infection and should not be used as the sole basis for treatment or other patient management decisions. A negative result may occur with  improper specimen collection/handling, submission of specimen other than nasopharyngeal swab, presence of viral mutation(s) within the areas targeted by this assay, and inadequate number of viral copies(<138 copies/mL). A negative result must be combined with clinical observations, patient history, and epidemiological information. The expected result is Negative.  Fact Sheet for Patients:  BloggerCourse.com  Fact Sheet for Healthcare Providers:  SeriousBroker.it  This test is no t yet approved or cleared by the [place]  FDA and  has been authorized for detection and/or diagnosis of SARS-CoV-2 by FDA under an Emergency Use Authorization (EUA). This EUA will remain  in effect (meaning this test can be used) for the duration of the COVID-19 declaration under Section 564(b)(1) of the Act, 21 U.S.C.section 360bbb-3(b)(1), unless  the authorization is terminated  or revoked sooner.       Influenza A by PCR NEGATIVE NEGATIVE Final   Influenza B by PCR NEGATIVE NEGATIVE Final    Comment: (NOTE) The Xpert Xpress SARS-CoV-2/FLU/RSV plus assay is intended as an aid in the diagnosis of influenza from Nasopharyngeal swab specimens and should not be used as a sole basis for treatment. Nasal washings and aspirates are unacceptable for Xpert Xpress SARS-CoV-2/FLU/RSV testing.  Fact Sheet for Patients: BloggerCourse.com  Fact Sheet for Healthcare Providers: SeriousBroker.it  This test is not yet approved or cleared by the [place]  FDA and has been authorized for detection and/or diagnosis of SARS-CoV-2 by FDA under an Emergency Use  Authorization (EUA). This EUA will remain in effect (meaning this test can be used) for the duration of the COVID-19 declaration under Section 564(b)(1) of the Act, 21 U.S.C. section 360bbb-3(b)(1), unless the authorization is terminated or revoked.     Resp Syncytial Virus by PCR NEGATIVE NEGATIVE Final    Comment: (NOTE) Fact Sheet for Patients: BloggerCourse.com  Fact Sheet for Healthcare Providers: SeriousBroker.it  This test is not yet approved or cleared by the [place]  FDA and has been authorized for detection and/or diagnosis of SARS-CoV-2 by FDA under an Emergency Use Authorization (EUA). This EUA will remain in effect (meaning this test can be used) for the duration of the COVID-19 declaration under Section 564(b)(1) of the Act, 21 U.S.C. section 360bbb-3(b)(1), unless the authorization is terminated or revoked.  Performed at [facility], [address], [place], [place] [address]       Studies: No results found.  Scheduled Meds:  amLODipine   10 mg Oral Daily   azithromycin   500 mg Oral Daily   budesonide  (PULMICORT ) nebulizer solution  0.25 mg Nebulization BID   enoxaparin  (LOVENOX ) injection  40 mg Subcutaneous Q24H   fluticasone   2 spray Each Nare Daily   losartan   50 mg Oral Daily   And   hydrochlorothiazide   12.5 mg Oral Daily   ipratropium-albuterol   3 mL Nebulization BID   metoprolol  tartrate  50 mg Oral BID   pantoprazole   40 mg Oral Daily   predniSONE   50 mg Oral Q breakfast    Continuous Infusions:   LOS: 4 days     Lebron JINNY Cage, MD [facility]  If 7PM-7AM, please contact night-coverage [email or web address] 06/24/2024, 4:45 PM

## 2024-06-25 ENCOUNTER — Other Ambulatory Visit (HOSPITAL_COMMUNITY): Payer: Self-pay

## 2024-06-25 DIAGNOSIS — J441 Chronic obstructive pulmonary disease with (acute) exacerbation: Secondary | ICD-10-CM | POA: Diagnosis not present

## 2024-06-25 MED ORDER — PREDNISONE 10 MG PO TABS
ORAL_TABLET | ORAL | 0 refills | Status: AC
  Filled 2024-06-25: qty 30, 12d supply, fill #0

## 2024-06-25 NOTE — Progress Notes (Signed)
 Discharge med in a secure bag delivered to pt in in room by this RN

## 2024-06-25 NOTE — Discharge Summary (Signed)
 Physician Discharge Summary   Patient: [name] MRN: [id number] DOB: 10/10/1951  Admit date:     06/20/2024  Discharge date: 06/25/24  Discharge Physician: [name]   PCP: Christie Ip, MD   Recommendations at discharge:   Follow-up with PCP in 1 week Follow-up with pulmonary as scheduled  Discharge Diagnoses: Active Problems:   COPD with acute exacerbation [facility])    Hospital Course: [name] is a 73 y.o. female with medical history significant for hypertension, COPD on room air, ongoing tobacco abuse multiple hospital admissions for acute exacerbation of COPD last discharged on 7/22 returns with 2 days of congestion, cough, dyspnea and shortness of breath being admitted to the hospital with recurrent acute exacerbation of COPD.  The patient states that she continues to smoke about 1 pack of cigarettes per day, she has been stable at home for the last few weeks without any respiratory symptoms.  Patient admitted for further management.    Today, patient denies any new complaints.  Reports feeling better, denies any worsening shortness of breath, denies any chest pain, abdominal pain, nausea/vomiting, fever/chills.  Discussed extensively with patient about the need to quit smoking, be compliant with medications, verbalized understanding.  Follow-up with PCP and pulmonary.    Assessment and Plan:  Acute exacerbation of COPD Initially on 2 L of O2, weaned off to room air Respiratory viral panel negative Chest x-ray with no active cardiopulmonary disease Continue DuoNebs, Pulmicort  Switched IV Solu-Medrol  to p.o. prednisone , 2-week taper upon discharge Continue daily Claritin , Flonase  Outpatient follow-up with PCP as well as pulmonology   Elevated BNP Does not appear overloaded Last echo done on 04/19/2024 showed EF greater than 75%, hyperdynamic function, no regional wall motion abnormality Follow-up with PCP   Abnormal troponin Initial  troponin 41--> 29 , without acute EKG changes, no complaints of chest pain   Hypertension Continue home Lopressor , amlodipine , Hyzaar   GERD Protonix  daily   Tobacco abuse Still smokes about a pack of cigarettes per day Advised to quit States nicotine  patch makes her nauseous, refusing    Consultants: None Procedures performed: None Disposition: Home Diet recommendation:  Cardiac diet   DISCHARGE MEDICATION: Allergies as of 06/25/2024       Reactions   Ace Inhibitors Anaphylaxis, Swelling, Other (See Comments)   Throat swelling  (Re: Lisinopril -hydrochlorothiazide ) Pt questioned if she is allergic in 2025.        Medication List     STOP taking these medications    hydrocortisone  2.5 % rectal cream Commonly known as: ANUSOL -HC       TAKE these medications    acetaminophen  500 MG tablet Commonly known as: TYLENOL  Take 1,000 mg by mouth daily as needed for moderate pain (pain score 4-6) or mild pain (pain score 1-3).   albuterol  108 (90 Base) MCG/ACT inhaler Commonly known as: VENTOLIN  HFA Inhale 2 puffs into the lungs every 6 (six) hours as needed for wheezing or shortness of breath.   albuterol  (2.5 MG/3ML) 0.083% nebulizer solution Commonly known as: PROVENTIL  Take 3 mLs (2.5 mg total) by nebulization every 4 (four) hours as needed for wheezing or shortness of breath.   amLODipine  10 MG tablet Commonly known as: NORVASC  Take 1 tablet (10 mg total) by mouth daily.   fluticasone  50 MCG/ACT nasal spray Commonly known as: FLONASE  Place 2 sprays into both nostrils daily. What changed:  when to take this reasons to take this   fluticasone -salmeterol 100-50 MCG/ACT Aepb Commonly known as: ADVAIR   Inhale 1 puff into the lungs 2 (two) times daily. What changed: when to take this   guaiFENesin -dextromethorphan  100-10 MG/5ML syrup Commonly known as: ROBITUSSIN DM Take 10 mLs by mouth every 4 (four) hours as needed for cough.   ipratropium-albuterol   0.5-2.5 (3) MG/3ML Soln Commonly known as: DUONEB Take 3 mLs by nebulization 3 (three) times daily for 5 days, THEN 3 mLs every 6 (six) hours as needed. Start taking on: May 29, 2024 What changed: See the new instructions.   loratadine  10 MG tablet Commonly known as: CLARITIN  Take 1 tablet (10 mg total) by mouth daily.   losartan -hydrochlorothiazide  50-12.5 MG tablet Commonly known as: HYZAAR Take 1 tablet by mouth 2 (two) times daily.   metoprolol  tartrate 50 MG tablet Commonly known as: LOPRESSOR  Take 1 tablet (50 mg total) by mouth 2 (two) times daily.   pantoprazole  40 MG tablet Commonly known as: PROTONIX  Take 1 tablet (40 mg total) by mouth daily.   predniSONE  10 MG tablet Commonly known as: DELTASONE  Take 4 tablets (40 mg total) by mouth daily with breakfast for 3 days, THEN 3 tablets (30 mg total) daily with breakfast for 3 days, THEN 2 tablets (20 mg total) daily with breakfast for 3 days, THEN 1 tablet (10 mg total) daily with breakfast for 3 days. Start taking on: June 25, 2024        Follow-up Information     Christie Ip, MD. Schedule an appointment as soon as possible for a visit in 1 week(s).   Specialty: Internal Medicine Contact information: [address] [place] [address] [phone]                Discharge Exam: Fredricka Weights   06/20/24 9191 06/20/24 1719  Weight: 72.6 kg 83.7 kg   General: NAD  Cardiovascular: S1, S2 present Respiratory: Diminished breath sounds bilaterally Abdomen: Soft, nontender, nondistended, bowel sounds present Musculoskeletal: No bilateral pedal edema noted Skin: Normal Psychiatry: Normal mood   Condition at discharge: stable  The results of significant diagnostics from this hospitalization (including imaging, microbiology, ancillary and laboratory) are listed below for reference.   Imaging Studies: DG Chest 2 View Result Date: 06/20/2024 CLINICAL DATA:  Shortness of breath. EXAM:  CHEST - 2 VIEW COMPARISON:  May 24, 2024. FINDINGS: The heart size and mediastinal contours are within normal limits. Both lungs are clear. The visualized skeletal structures are unremarkable. IMPRESSION: No active cardiopulmonary disease. Electronically Signed   By: Lynwood Landy Raddle M.D.   On: 06/20/2024 09:05    Microbiology: Results for orders placed or performed during the hospital encounter of 06/20/24  Resp panel by RT-PCR (RSV, Flu A&B, Covid) Anterior Nasal Swab     Status: None   Collection Time: 06/20/24 12:48 PM   Specimen: Anterior Nasal Swab  Result Value Ref Range Status   SARS Coronavirus 2 by RT PCR NEGATIVE NEGATIVE Final    Comment: (NOTE) SARS-CoV-2 target nucleic acids are NOT DETECTED.  The SARS-CoV-2 RNA is generally detectable in upper respiratory specimens during the acute phase of infection. The lowest concentration of SARS-CoV-2 viral copies this assay can detect is 138 copies/mL. A negative result does not preclude SARS-Cov-2 infection and should not be used as the sole basis for treatment or other patient management decisions. A negative result may occur with  improper specimen collection/handling, submission of specimen other than nasopharyngeal swab, presence of viral mutation(s) within the areas targeted by this assay, and inadequate number of viral copies(<138 copies/mL). A  negative result must be combined with clinical observations, patient history, and epidemiological information. The expected result is Negative.  Fact Sheet for Patients:  BloggerCourse.com  Fact Sheet for Healthcare Providers:  SeriousBroker.it  This test is no t yet approved or cleared by the [place]  FDA and  has been authorized for detection and/or diagnosis of SARS-CoV-2 by FDA under an Emergency Use Authorization (EUA). This EUA will remain  in effect (meaning this test can be used) for the duration of the COVID-19  declaration under Section 564(b)(1) of the Act, 21 U.S.C.section 360bbb-3(b)(1), unless the authorization is terminated  or revoked sooner.       Influenza A by PCR NEGATIVE NEGATIVE Final   Influenza B by PCR NEGATIVE NEGATIVE Final    Comment: (NOTE) The Xpert Xpress SARS-CoV-2/FLU/RSV plus assay is intended as an aid in the diagnosis of influenza from Nasopharyngeal swab specimens and should not be used as a sole basis for treatment. Nasal washings and aspirates are unacceptable for Xpert Xpress SARS-CoV-2/FLU/RSV testing.  Fact Sheet for Patients: BloggerCourse.com  Fact Sheet for Healthcare Providers: SeriousBroker.it  This test is not yet approved or cleared by the [place]  FDA and has been authorized for detection and/or diagnosis of SARS-CoV-2 by FDA under an Emergency Use Authorization (EUA). This EUA will remain in effect (meaning this test can be used) for the duration of the COVID-19 declaration under Section 564(b)(1) of the Act, 21 U.S.C. section 360bbb-3(b)(1), unless the authorization is terminated or revoked.     Resp Syncytial Virus by PCR NEGATIVE NEGATIVE Final    Comment: (NOTE) Fact Sheet for Patients: BloggerCourse.com  Fact Sheet for Healthcare Providers: SeriousBroker.it  This test is not yet approved or cleared by the [place]  FDA and has been authorized for detection and/or diagnosis of SARS-CoV-2 by FDA under an Emergency Use Authorization (EUA). This EUA will remain in effect (meaning this test can be used) for the duration of the COVID-19 declaration under Section 564(b)(1) of the Act, 21 U.S.C. section 360bbb-3(b)(1), unless the authorization is terminated or revoked.  Performed at [facility], [address] [place], [place] [address]     Labs: CBC: Recent Labs  Lab 06/20/24 0810 06/21/24 0355  06/22/24 0341  WBC 4.1 3.2* 6.5  NEUTROABS  --   --  5.7  HGB 11.9* 11.2* 11.5*  HCT 36.8 36.0 37.5  MCV 90.2 90.2 92.1  PLT 370 374 371   Basic Metabolic Panel: Recent Labs  Lab 06/20/24 0810 06/21/24 0355 06/22/24 0341 06/23/24 0622 06/23/24 0623  NA 137 132* 136 139  --   K 3.6 4.0 4.3 3.9  --   CL 105 99 101 98  --   CO2 23 23 25 29   --   GLUCOSE 120* 162* 155* 114*  --   BUN 6* 10 18 18   --   CREATININE 0.65 0.64 0.79 0.84  --   CALCIUM  8.8* 9.0 9.1 8.9  --   MG  --   --   --   --  2.1  PHOS  --   --   --   --  2.9   Liver Function Tests: No results for input(s): AST, ALT, ALKPHOS, BILITOT, PROT, ALBUMIN in the last 168 hours. CBG: No results for input(s): GLUCAP in the last 168 hours.  Discharge time spent: greater than 30 minutes.  Signed: Lebron JINNY Cage, MD [facility] 06/25/2024

## 2024-06-25 NOTE — Progress Notes (Signed)
 Discharge instructions reviewed with patient . All questions answered. All belongings accounted for. Patient to follow up with MD in  1 weeks. Patient medications hand delivered from outpatient pharmacy by ciera,RN. PIV removed. Assisted via WC to private vehicle.

## 2024-06-26 ENCOUNTER — Telehealth: Payer: Self-pay

## 2024-06-26 NOTE — Transitions of Care (Post Inpatient/ED Visit) (Signed)
   06/26/2024  Name: [name] MRN: [id number] DOB: [birth year]  Today's TOC FU Call Status: Today's TOC FU Call Status:: Unsuccessful Call (1st Attempt) Unsuccessful Call (1st Attempt) Date: 06/26/24  Attempted to reach the patient regarding the most recent Inpatient/ED visit.  Follow Up Plan: Additional outreach attempts will be made to reach the patient to complete the Transitions of Care (Post Inpatient/ED visit) call.   Medford Balboa, BSN, RN [place]  VBCI - [employer] [facility] RN Care Manager [phone]

## 2024-06-27 ENCOUNTER — Telehealth: Payer: Self-pay

## 2024-06-27 NOTE — Transitions of Care (Post Inpatient/ED Visit) (Signed)
 06/27/2024  Name: [name] MRN: [id number] DOB: September 25, 1951  Today's TOC FU Call Status: Today's TOC FU Call Status:: Successful TOC FU Call Completed TOC FU Call Complete Date: 06/27/24 Patient's Name and Date of Birth confirmed.  Transition Care Management Follow-up Telephone Call Date of Discharge: 06/25/24 Discharge Facility: [facility]) Type of Discharge: Inpatient Admission Primary Inpatient Discharge Diagnosis:: COPD Exacerbation How have you been since you were released from the hospital?: Better Any questions or concerns?: No  Items Reviewed: Did you receive and understand the discharge instructions provided?: Yes Medications obtained,verified, and reconciled?: Yes (Medications Reviewed) Any new allergies since your discharge?: No Dietary orders reviewed?: Yes Type of Diet Ordered:: Low Sodium Heart Healthy Do you have support at home?: Yes People in Home [RPT]: spouse Name of Support/Comfort Primary Source: [place]  Medications Reviewed Today: Medications Reviewed Today     Reviewed by [name], RN (Case Manager) on 06/27/24 at 1618  Med List Status: <None>   Medication Order Taking? Sig Documenting Provider Last Dose Status Informant  acetaminophen  (TYLENOL ) 500 MG tablet [id number]  Take 1,000 mg by mouth daily as needed for moderate pain (pain score 4-6) or mild pain (pain score 1-3). [provider]  Active Self, Pharmacy Records  albuterol  (PROVENTIL ) (2.5 MG/3ML) 0.083% nebulizer solution [id number]  Take 3 mLs (2.5 mg total) by nebulization every 4 (four) hours as needed for wheezing or shortness of breath. Gomez-Caraballo, Maria, MD  Active Self, Pharmacy Records  albuterol  (VENTOLIN  HFA) 108 [phone] Base) MCG/ACT inhaler [id number]  Inhale 2 puffs into the lungs every 6 (six) hours as needed for wheezing or shortness of breath. Jolaine Pac, DO  Active Self, Pharmacy Records  amLODipine  (NORVASC ) 10 MG tablet [id number]  Take 1 tablet  (10 mg total) by mouth daily. Fernand Prost, MD  Active Self, Pharmacy Records  fluticasone  (FLONASE ) 50 MCG/ACT nasal spray [id number] Yes Place 2 sprays into both nostrils daily. Sebastian Toribio GAILS, MD  Active Self, Pharmacy Records  fluticasone -salmeterol (ADVAIR ) 100-50 MCG/ACT AEPB [id number]  Inhale 1 puff into the lungs 2 (two) times daily.  Patient taking differently: Inhale 1 puff into the lungs daily.   Barbarann Nest, MD  Active Self, Pharmacy Records  guaiFENesin -dextromethorphan  (ROBITUSSIN DM) 100-10 MG/5ML syrup [id number] Yes Take 10 mLs by mouth every 4 (four) hours as needed for cough.  Patient taking differently: Take 10 mLs by mouth.   Vann, Jessica U, DO  Active Self, Pharmacy Records           Med Note (CRUTHIS, CHLOE C   Thu May 24, 2024 11:18 AM)    ipratropium-albuterol  (DUONEB) 0.5-2.5 (3) MG/3ML SOLN [id number]  Take 3 mLs by nebulization 3 (three) times daily for 5 days, THEN 3 mLs every 6 (six) hours as needed.  Patient taking differently: 3 mLs every 6 (six) hours as needed for wheezing/SOB   Sebastian Toribio GAILS, MD  Active Self, Pharmacy Records  loratadine  (CLARITIN ) 10 MG tablet [id number] Yes Take 1 tablet (10 mg total) by mouth daily. Sebastian Toribio GAILS, MD  Active Self, Pharmacy Records  losartan -hydrochlorothiazide  [facility]) 50-12.5 MG tablet [id number]  Take 1 tablet by mouth 2 (two) times daily. Elnora Ip, MD  Active Self, Pharmacy Records           Med Note (CRUTHIS, CHLOE C   Wed Jun 20, 2024 11:52 AM)    metoprolol  tartrate (LOPRESSOR ) 50 MG tablet [id number]  Take 1 tablet (50 mg total) by mouth 2 (two) times daily.  Vann, Jessica U, DO  Active Self, Pharmacy Records  pantoprazole  (PROTONIX ) 40 MG tablet [id number]  Take 1 tablet (40 mg total) by mouth daily. Sebastian Toribio GAILS, MD  Active Self, Pharmacy Records  predniSONE  (DELTASONE ) 10 MG tablet [id number]  Take 4 tablets (40 mg total) by mouth daily with breakfast for 3 days, THEN 3 tablets (30 mg  total) daily with breakfast for 3 days, THEN 2 tablets (20 mg total) daily with breakfast for 3 days, THEN 1 tablet (10 mg total) daily with breakfast for 3 days. Ezenduka, Nkeiruka J, MD  Active             Home Care and Equipment/Supplies: Were Home Health Services Ordered?: NA Any new equipment or medical supplies ordered?: NA  Functional Questionnaire: Do you need assistance with bathing/showering or dressing?: No Do you need assistance with meal preparation?: No Do you need assistance with eating?: No Do you have difficulty maintaining continence: No Do you need assistance with getting out of bed/getting out of a chair/moving?: No Do you have difficulty managing or taking your medications?: No  Follow up appointments reviewed:    SDOH Interventions Today    Flowsheet Row Most Recent Value  SDOH Interventions   Food Insecurity Interventions Intervention Not Indicated  Housing Interventions Intervention Not Indicated  Transportation Interventions Intervention Not Indicated  Utilities Interventions Intervention Not Indicated    Goals Addressed             This Visit's Progress    VBCI Transitions of Care (TOC) Care Plan       Problems: (reviewed 06/27/24) Recent Hospitalization for treatment of COPD exacerbation related to allergies Medication access barrier The patient is on disability and states she cannot always afford her medication and Medication management barrier : The patient has a history of non-compliance Pharmacy referral has been placed on last admission but the patient did not answer the phone or call the pharmacist back.  PCP appointment scheduled for 07/03/24 - confirmed with the patient  Goal:  Over the next 30 days, the patient will not experience hospital readmission  Interventions:   COPD Interventions:  Advised patient to track and manage COPD triggers Assessed social determinant of health barriers Discussed the importance of adequate rest and  management of fatigue with COPD Provided instruction about proper use of medications used for management of COPD including inhalers Referral made to community resources care guide team for assistance with Pharmacy referral: Completed 04/25/24 - The pharmacy team has not connected with the patient. They have left messages and the patient has not returned calls.   Screening for signs and symptoms of depression related to chronic disease state  Educate the patient on the medications such as Claritin , Flonase , Prednisone  taper, nebulizer   and inhalers that aide in COPD triggers Smoking Cessation. The patient declines Nicotine  patches and will try the Nicotine  gum. She is trying to cut back on the number of cigarettes she smokes daily. She states the nicotine  patches make her feel nauseated.  Patient Self Care Activities:  Attend all scheduled provider appointments Call pharmacy for medication refills 3-7 days in advance of running out of medications Call provider office for new concerns or questions  Notify RN Care Manager of [facility] call rescheduling needs Participate in Transition of Care Program/Attend [facility] scheduled calls Perform all self care activities independently  Take medications as prescribed     Plan:  Telephone follow up appointment with care management team member scheduled for:  Wednesday August  27th at 1:00pm The patient states she will be in [facility] the fist week of September and will not be available for an [employer] call. Next [facility] Outreach scheduled for Wednesday September 10th at 1:00pm         Medford Balboa, BSN, RN [place]  VBCI - [facility] RN Care Manager [phone]

## 2024-06-27 NOTE — Patient Instructions (Signed)
 Visit Information  Thank you for taking time to visit with me today. Please don't hesitate to contact me if I can be of assistance to you before our next scheduled telephone appointment.  Our next appointment is by telephone on Wednesday August 27th at 1pm  Following is a copy of your care plan:   Goals Addressed             This Visit's Progress    VBCI Transitions of Care (TOC) Care Plan       Problems: (reviewed 06/27/24) Recent Hospitalization for treatment of COPD exacerbation related to allergies Medication access barrier The patient is on disability and states she cannot always afford her medication and Medication management barrier : The patient has a history of non-compliance Pharmacy referral has been placed on last admission but the patient did not answer the phone or call the pharmacist back.  PCP appointment scheduled for 07/03/24 - confirmed with the patient  Goal:  Over the next 30 days, the patient will not experience hospital readmission  Interventions:   COPD Interventions:  Advised patient to track and manage COPD triggers Assessed social determinant of health barriers Discussed the importance of adequate rest and management of fatigue with COPD Provided instruction about proper use of medications used for management of COPD including inhalers Referral made to community resources care guide team for assistance with Pharmacy referral: Completed 04/25/24 - The pharmacy team has not connected with the patient. They have left messages and the patient has not returned calls.   Screening for signs and symptoms of depression related to chronic disease state  Educate the patient on the medications such as Claritin , Flonase , Prednisone  taper, nebulizer   and inhalers that aide in COPD triggers Smoking Cessation. The patient declines Nicotine  patches and will try the Nicotine  gum. She is trying to cut back on the number of cigarettes she smokes daily. She states the nicotine   patches make her feel nauseated.  Patient Self Care Activities:  Attend all scheduled provider appointments Call pharmacy for medication refills 3-7 days in advance of running out of medications Call provider office for new concerns or questions  Notify RN Care Manager of [facility] call rescheduling needs Participate in Transition of Care Program/Attend [facility] scheduled calls Perform all self care activities independently  Take medications as prescribed     Plan:  Telephone follow up appointment with care management team member scheduled for:  Wednesday August 27th at 1:00pm The patient states she will be in [facility] the fist week of September and will not be available for an [employer] call. Next [facility] Outreach scheduled for Wednesday September 10th at 1:00pm        Patient verbalizes understanding of instructions and care plan provided today and agrees to view in MyChart. Active MyChart status and patient understanding of how to access instructions and care plan via MyChart confirmed with patient.     The patient has been provided with contact information for the care management team and has been advised to call with any health related questions or concerns.   Please call the care guide team at [phone] if you need to cancel or reschedule your appointment.   Please call the Suicide and Crisis Lifeline: 988 call the [place]  National Suicide Prevention Lifeline: (518)077-8829 or TTY: 650-369-0866 TTY (305)210-2840) to talk to a trained counselor if you are experiencing a Mental Health or Behavioral Health Crisis or need someone to talk to.  Medford Balboa, BSN, RN [employer]  [facility] Population Health RN Care Manager [phone]

## 2024-07-03 ENCOUNTER — Encounter: Admitting: Student

## 2024-07-04 ENCOUNTER — Telehealth: Payer: Self-pay

## 2024-07-10 ENCOUNTER — Telehealth: Payer: Self-pay

## 2024-07-11 ENCOUNTER — Other Ambulatory Visit: Payer: Self-pay

## 2024-07-11 ENCOUNTER — Emergency Department (HOSPITAL_COMMUNITY)

## 2024-07-11 ENCOUNTER — Inpatient Hospital Stay (HOSPITAL_COMMUNITY)
Admission: EM | Admit: 2024-07-11 | Discharge: 2024-07-14 | DRG: 190 | Disposition: A | Discharge status: Home / Self Care | Attending: Internal Medicine | Admitting: Internal Medicine

## 2024-07-11 DIAGNOSIS — J9601 Acute respiratory failure with hypoxia: Secondary | ICD-10-CM | POA: Diagnosis not present

## 2024-07-11 DIAGNOSIS — Z981 Arthrodesis status: Secondary | ICD-10-CM | POA: Diagnosis not present

## 2024-07-11 DIAGNOSIS — Z853 Personal history of malignant neoplasm of breast: Secondary | ICD-10-CM

## 2024-07-11 DIAGNOSIS — E119 Type 2 diabetes mellitus without complications: Secondary | ICD-10-CM | POA: Diagnosis present

## 2024-07-11 DIAGNOSIS — I160 Hypertensive urgency: Secondary | ICD-10-CM | POA: Diagnosis not present

## 2024-07-11 DIAGNOSIS — F17210 Nicotine dependence, cigarettes, uncomplicated: Secondary | ICD-10-CM

## 2024-07-11 DIAGNOSIS — Z923 Personal history of irradiation: Secondary | ICD-10-CM | POA: Diagnosis not present

## 2024-07-11 DIAGNOSIS — Z91148 Patient's other noncompliance with medication regimen for other reason: Secondary | ICD-10-CM | POA: Diagnosis not present

## 2024-07-11 DIAGNOSIS — Z1152 Encounter for screening for COVID-19: Secondary | ICD-10-CM

## 2024-07-11 DIAGNOSIS — Z90710 Acquired absence of both cervix and uterus: Secondary | ICD-10-CM | POA: Diagnosis not present

## 2024-07-11 DIAGNOSIS — R0602 Shortness of breath: Secondary | ICD-10-CM | POA: Diagnosis not present

## 2024-07-11 DIAGNOSIS — Z7951 Long term (current) use of inhaled steroids: Secondary | ICD-10-CM

## 2024-07-11 DIAGNOSIS — I10 Essential (primary) hypertension: Secondary | ICD-10-CM | POA: Diagnosis not present

## 2024-07-11 DIAGNOSIS — R062 Wheezing: Secondary | ICD-10-CM | POA: Diagnosis not present

## 2024-07-11 DIAGNOSIS — K219 Gastro-esophageal reflux disease without esophagitis: Secondary | ICD-10-CM | POA: Diagnosis not present

## 2024-07-11 DIAGNOSIS — Z803 Family history of malignant neoplasm of breast: Secondary | ICD-10-CM | POA: Diagnosis not present

## 2024-07-11 DIAGNOSIS — J441 Chronic obstructive pulmonary disease with (acute) exacerbation: Principal | ICD-10-CM | POA: Diagnosis present

## 2024-07-11 DIAGNOSIS — Z9221 Personal history of antineoplastic chemotherapy: Secondary | ICD-10-CM | POA: Diagnosis not present

## 2024-07-11 DIAGNOSIS — Z888 Allergy status to other drugs, medicaments and biological substances status: Secondary | ICD-10-CM | POA: Diagnosis not present

## 2024-07-11 DIAGNOSIS — F17200 Nicotine dependence, unspecified, uncomplicated: Secondary | ICD-10-CM | POA: Diagnosis not present

## 2024-07-11 DIAGNOSIS — T465X6A Underdosing of other antihypertensive drugs, initial encounter: Secondary | ICD-10-CM | POA: Diagnosis present

## 2024-07-11 DIAGNOSIS — E785 Hyperlipidemia, unspecified: Secondary | ICD-10-CM | POA: Diagnosis not present

## 2024-07-11 DIAGNOSIS — Z79899 Other long term (current) drug therapy: Secondary | ICD-10-CM

## 2024-07-11 LAB — CBC
HCT: 37.4 % (ref 36.0–46.0)
Hemoglobin: 11.3 g/dL — ABNORMAL LOW (ref 12.0–15.0)
MCH: 27.4 pg (ref 26.0–34.0)
MCHC: 30.2 g/dL (ref 30.0–36.0)
MCV: 90.6 fL (ref 80.0–100.0)
Platelets: 253 K/uL (ref 150–400)
RBC: 4.13 MIL/uL (ref 3.87–5.11)
RDW: 15.4 % (ref 11.5–15.5)
WBC: 4.5 K/uL (ref 4.0–10.5)
nRBC: 0 % (ref 0.0–0.2)

## 2024-07-11 LAB — RESP PANEL BY RT-PCR (RSV, FLU A&B, COVID)  RVPGX2
Influenza A by PCR: NEGATIVE
Influenza B by PCR: NEGATIVE
Resp Syncytial Virus by PCR: NEGATIVE
SARS Coronavirus 2 by RT PCR: NEGATIVE

## 2024-07-11 LAB — BASIC METABOLIC PANEL WITH GFR
Anion gap: 12 (ref 5–15)
BUN: 10 mg/dL (ref 8–23)
CO2: 23 mmol/L (ref 22–32)
Calcium: 9.2 mg/dL (ref 8.9–10.3)
Chloride: 105 mmol/L (ref 98–111)
Creatinine, Ser: 0.7 mg/dL (ref 0.44–1.00)
GFR, Estimated: >60 mL/min (ref >60)
Glucose, Bld: 93 mg/dL (ref 70–99)
Potassium: 4.6 mmol/L (ref 3.5–5.1)
Sodium: 140 mmol/L (ref 135–145)

## 2024-07-11 MED ORDER — ALBUTEROL SULFATE HFA 108 (90 BASE) MCG/ACT IN AERS
2.0000 | INHALATION_SPRAY | RESPIRATORY_TRACT | Status: DC | PRN
  Administered 2024-07-11: 2 via RESPIRATORY_TRACT
  Filled 2024-07-11 (×2): qty 6.7

## 2024-07-11 MED ORDER — ACETAMINOPHEN 650 MG RE SUPP: 650.0000 mg | Freq: Four times a day (QID) | RECTAL | Status: DC | PRN

## 2024-07-11 MED ORDER — IPRATROPIUM-ALBUTEROL 0.5-2.5 (3) MG/3ML IN SOLN
3.0000 mL | Freq: Once | RESPIRATORY_TRACT | Status: AC
  Administered 2024-07-11: 3 mL via RESPIRATORY_TRACT
  Filled 2024-07-11: qty 3

## 2024-07-11 MED ORDER — ONDANSETRON HCL 4 MG/2ML IJ SOLN: 4.0000 mg | Freq: Four times a day (QID) | INTRAMUSCULAR | Status: DC | PRN

## 2024-07-11 MED ORDER — AZITHROMYCIN 250 MG PO TABS
500.0000 mg | ORAL_TABLET | Freq: Every day | ORAL | Status: AC
  Administered 2024-07-12 – 2024-07-14 (×3): 500 mg via ORAL
  Filled 2024-07-11 (×3): qty 2

## 2024-07-11 MED ORDER — ACETAMINOPHEN 325 MG PO TABS: 650.0000 mg | ORAL_TABLET | Freq: Four times a day (QID) | ORAL | Status: DC | PRN

## 2024-07-11 MED ORDER — METHYLPREDNISOLONE SODIUM SUCC 40 MG IJ SOLR
40.0000 mg | Freq: Two times a day (BID) | INTRAMUSCULAR | Status: DC
  Administered 2024-07-12: 40 mg via INTRAVENOUS
  Filled 2024-07-11: qty 1

## 2024-07-11 MED ORDER — METHYLPREDNISOLONE SODIUM SUCC 125 MG IJ SOLR
125.0000 mg | Freq: Once | INTRAMUSCULAR | Status: AC
  Administered 2024-07-11: 125 mg via INTRAVENOUS
  Filled 2024-07-11: qty 2

## 2024-07-11 MED ORDER — ALBUTEROL SULFATE (2.5 MG/3ML) 0.083% IN NEBU: 2.5000 mg | INHALATION_SOLUTION | RESPIRATORY_TRACT | Status: DC | PRN

## 2024-07-11 MED ORDER — HYDRALAZINE HCL 20 MG/ML IJ SOLN: 10.0000 mg | Freq: Three times a day (TID) | INTRAMUSCULAR | Status: DC | PRN

## 2024-07-11 MED ORDER — PANTOPRAZOLE SODIUM 40 MG PO TBEC
40.0000 mg | DELAYED_RELEASE_TABLET | Freq: Every day | ORAL | Status: DC
  Administered 2024-07-12 – 2024-07-14 (×3): 40 mg via ORAL
  Filled 2024-07-11 (×3): qty 1

## 2024-07-11 MED ORDER — LORATADINE 10 MG PO TABS
10.0000 mg | ORAL_TABLET | Freq: Every day | ORAL | Status: DC
Start: 2024-07-12 — End: 2024-07-14
  Administered 2024-07-12 – 2024-07-13 (×2): 10 mg via ORAL
  Filled 2024-07-11 (×3): qty 1

## 2024-07-11 MED ORDER — METOPROLOL TARTRATE 50 MG PO TABS
50.0000 mg | ORAL_TABLET | Freq: Two times a day (BID) | ORAL | Status: DC
  Administered 2024-07-11 – 2024-07-14 (×6): 50 mg via ORAL
  Filled 2024-07-11: qty 2
  Filled 2024-07-11 (×5): qty 1

## 2024-07-11 MED ORDER — AMLODIPINE BESYLATE 5 MG PO TABS
10.0000 mg | ORAL_TABLET | Freq: Every day | ORAL | Status: DC
  Administered 2024-07-12 – 2024-07-14 (×3): 10 mg via ORAL
  Filled 2024-07-11 (×3): qty 2

## 2024-07-11 MED ORDER — ENOXAPARIN SODIUM 40 MG/0.4ML IJ SOSY
40.0000 mg | PREFILLED_SYRINGE | INTRAMUSCULAR | Status: DC
  Administered 2024-07-11 – 2024-07-13 (×3): 40 mg via SUBCUTANEOUS
  Filled 2024-07-11 (×3): qty 0.4

## 2024-07-11 MED ORDER — BISACODYL 5 MG PO TBEC: 5.0000 mg | DELAYED_RELEASE_TABLET | Freq: Every day | ORAL | Status: DC | PRN

## 2024-07-11 MED ORDER — FLUTICASONE FUROATE-VILANTEROL 100-25 MCG/ACT IN AEPB
1.0000 | INHALATION_SPRAY | Freq: Every day | RESPIRATORY_TRACT | Status: DC
  Administered 2024-07-12: 1 via RESPIRATORY_TRACT
  Filled 2024-07-11: qty 28

## 2024-07-11 MED ORDER — FLUTICASONE PROPIONATE 50 MCG/ACT NA SUSP
2.0000 | Freq: Every day | NASAL | Status: DC
  Administered 2024-07-12 – 2024-07-14 (×3): 2 via NASAL
  Filled 2024-07-11: qty 16

## 2024-07-11 MED ORDER — IPRATROPIUM-ALBUTEROL 0.5-2.5 (3) MG/3ML IN SOLN
3.0000 mL | Freq: Four times a day (QID) | RESPIRATORY_TRACT | Status: DC
  Administered 2024-07-11 – 2024-07-12 (×3): 3 mL via RESPIRATORY_TRACT
  Filled 2024-07-11 (×4): qty 3

## 2024-07-11 MED ORDER — SENNOSIDES-DOCUSATE SODIUM 8.6-50 MG PO TABS: 1.0000 | ORAL_TABLET | Freq: Every evening | ORAL | Status: DC | PRN

## 2024-07-11 MED ORDER — LOSARTAN POTASSIUM-HCTZ 50-12.5 MG PO TABS: 1.0000 | ORAL_TABLET | Freq: Two times a day (BID) | ORAL | Status: DC

## 2024-07-11 MED ORDER — HYDROCHLOROTHIAZIDE 12.5 MG PO TABS
12.5000 mg | ORAL_TABLET | Freq: Two times a day (BID) | ORAL | Status: DC
  Administered 2024-07-11 – 2024-07-14 (×6): 12.5 mg via ORAL
  Filled 2024-07-11 (×6): qty 1

## 2024-07-11 MED ORDER — ONDANSETRON HCL 4 MG PO TABS
4.0000 mg | ORAL_TABLET | Freq: Four times a day (QID) | ORAL | Status: DC | PRN
  Administered 2024-07-13: 4 mg via ORAL
  Filled 2024-07-11: qty 1

## 2024-07-11 MED ORDER — LOSARTAN POTASSIUM 50 MG PO TABS
50.0000 mg | ORAL_TABLET | Freq: Two times a day (BID) | ORAL | Status: DC
  Administered 2024-07-11 – 2024-07-14 (×6): 50 mg via ORAL
  Filled 2024-07-11 (×6): qty 1

## 2024-07-11 NOTE — H&P (Signed)
 History and Physical  [name] FMW:[id number] DOB: 1951-04-14 DOA: 07/11/2024  PCP: Elnora Ip, MD   Chief Complaint: Shortness of breath, wheezing  HPI: [name] is a 73 y.o. female with medical history significant for COPD with multiple hospitalization for COPD exacerbation, tobacco use disorder, HTN, HLD and depression who presents to the ED for evaluation of shortness of breath and wheezing. Patient reports that over the last 2 days she has had worsening shortness of breath and wheezing. She tried multiple nebulizer treatment yesterday without significant relief. She endorsed increased cough  that is occasionally productive but denies any chest pain, fever, chills, abdominal pain, nausea, vomiting, leg swelling or dysuria. Reports she is still smoking but has cut down from 1 pack/day to 1 pack lasting her a day and a half.  ED Course: Initial vitals show temp 98.1, RR 18-26, HR 90-100, SBP 150-190, SpO2 99% on room air. Initial labs significant for Hgb 11.3 otherwise normal BMP, CBC, negative flu, RSV and COVID test. EKG shows sinus tach with PVCs, RAE and LVH. CXR shows no active disease. Pt received IV Solu-Medrol  and DuoNebs x 2. TRH was consulted for admission.   Review of Systems: Please see HPI for pertinent positives and negatives. A complete 10 system review of systems are otherwise negative.  Past Medical History:  Diagnosis Date   Acute sinusitis 01/09/2019   Acute sinusitis 01/09/2019   Anxiety    Arthritis    fingers, knees (08/16/2018)   Asthma    Atopic dermatitis 03/20/2018   Axillary hidradenitis suppurativa 10/13/2018   Cancer of right breast (HCC) 1991   s/p lumpectomy, chemotherapy and radiation therapy in 1991. Mammogram in 2007 was normal.   Cellulitis of buttock, left 12/13/2022   Constipated    h/o   COPD (chronic obstructive pulmonary disease) (HCC)    History of multiple hospital admissions for exercabation    COPD  exacerbation (HCC) 01/01/2019   COPD exacerbation (HCC) 02/17/2024   COPD with acute exacerbation (HCC) 01/14/2019   COPD with exacerbation (HCC) 04/06/2009   Qualifier: Diagnosis of  By: Loletta MD, Vijay     Depression    Diarrhea    h/o   Facial cellulitis 08/30/2022   Facial edema 08/31/2022   GERD (gastroesophageal reflux disease)    Grade I diastolic dysfunction 01/12/2024   Headache    a few times/month (08/16/2018   Heart murmur 10/05/2011   first time I ever heard I had one was today   History of breast cancer 12/01/2012   Pt with h/o breast CA s/p lumpectomy with chemo/radiation in 1991. Pt mammogram 2011 was unremarkable. Had CT chest 10/2012 in ED for SOB and showed spiculated nodule with lymph node. 12/04/12: Birads 2; repeat diagnostic mammogram in 1 year.     Hyperlipidemia    Hypertension    Lower extremity edema 09/21/2018   Obesity    Personal history of chemotherapy    Personal history of radiation therapy    Pneumonia    couple times in the last 10-15 yrs (08/16/2018)   QT prolongation 08/08/2014   Seasonal allergies 02/25/2017   Shortness of breath 10/05/2011   at rest; lying down; w/exertion   Sigmoid diverticulitis 80/2008   Tobacco abuse    Type 2 diabetes mellitus (HCC) 05/14/2009   Type II diabetes mellitus (HCC)    Past Surgical History:  Procedure Laterality Date   ABDOMINAL HYSTERECTOMY     ANTERIOR CERVICAL DECOMP/DISCECTOMY FUSION  2012   Dr.  Dumonski  put plate in; did something to my vertebrae   BACK SURGERY     BREAST LUMPECTOMY Right 1991   DOBUTAMINE  STRESS ECHO  08/2004   Inferior ischemia, normal LV systolic function, no significant CAD   MINOR IRRIGATION AND DEBRIDEMENT OF WOUND Right 09/04/2022   Procedure: IRRIGATION AND DEBRIDEMENT OF NASAL WOUND;  Surgeon: Luciano Standing, MD;  Location: [facility] OR;  Service: ENT;  Laterality: Right;   Social History:  reports that she has been smoking cigarettes. She has a 22.5 pack-year  smoking history. She has never used smokeless tobacco. She reports current alcohol  use of about 4.0 standard drinks of alcohol  per week. She reports that she does not use drugs.  Allergies  Allergen Reactions   Ace Inhibitors Anaphylaxis, Swelling and Other (See Comments)    Throat swelling  (Re: Lisinopril -hydrochlorothiazide )  Pt questioned if she is allergic in 2025.    Family History  Problem Relation Age of Onset   Cancer Mother        unknown   Breast cancer Daughter 89   Breast cancer Daughter        93s   BRCA 1/2 Neg Hx      Prior to Admission medications   Medication Sig Start Date End Date Taking? Authorizing Provider  acetaminophen  (TYLENOL ) 500 MG tablet Take 1,000 mg by mouth daily as needed for moderate pain (pain score 4-6) or mild pain (pain score 1-3).    [provider]  albuterol  (PROVENTIL ) (2.5 MG/3ML) 0.083% nebulizer solution Take 3 mLs (2.5 mg total) by nebulization every 4 (four) hours as needed for wheezing or shortness of breath. 04/21/24   Elnora Ip, MD  albuterol  (VENTOLIN  HFA) 108 (90 Base) MCG/ACT inhaler Inhale 2 puffs into the lungs every 6 (six) hours as needed for wheezing or shortness of breath. 01/10/24   Jolaine Pac, DO  amLODipine  (NORVASC ) 10 MG tablet Take 1 tablet (10 mg total) by mouth daily. 03/19/24 03/19/25  Fernand Prost, MD  fluticasone  (FLONASE ) 50 MCG/ACT nasal spray Place 2 sprays into both nostrils daily. 05/30/24   Sebastian Toribio GAILS, MD  fluticasone -salmeterol (ADVAIR ) 100-50 MCG/ACT AEPB Inhale 1 puff into the lungs 2 (two) times daily. Patient taking differently: Inhale 1 puff into the lungs daily. 05/14/24   Barbarann Nest, MD  guaiFENesin -dextromethorphan  (ROBITUSSIN DM) 100-10 MG/5ML syrup Take 10 mLs by mouth every 4 (four) hours as needed for cough. Patient taking differently: Take 10 mLs by mouth. 04/22/24   Vann, Jessica U, DO  ipratropium-albuterol  (DUONEB) 0.5-2.5 (3) MG/3ML SOLN Take 3 mLs by  nebulization 3 (three) times daily for 5 days, THEN 3 mLs every 6 (six) hours as needed. Patient taking differently: 3 mLs every 6 (six) hours as needed for wheezing/SOB 05/29/24 06/03/25  Sebastian Toribio GAILS, MD  loratadine  (CLARITIN ) 10 MG tablet Take 1 tablet (10 mg total) by mouth daily. 05/30/24   Sebastian Toribio GAILS, MD  losartan -hydrochlorothiazide  (HYZAAR) 50-12.5 MG tablet Take 1 tablet by mouth 2 (two) times daily. 03/20/24   Elnora Ip, MD  metoprolol  tartrate (LOPRESSOR ) 50 MG tablet Take 1 tablet (50 mg total) by mouth 2 (two) times daily. 04/22/24   Vann, Jessica U, DO  pantoprazole  (PROTONIX ) 40 MG tablet Take 1 tablet (40 mg total) by mouth daily. 05/30/24   Sebastian Toribio GAILS, MD    Physical Exam: BP (!) 121/96   Pulse 100   Temp 98.1 F (36.7 C)   Resp (!) 26   SpO2 98%  General:  Pleasant, well-appearing elderly woman laying in bed. No acute distress. HEENT: [place]/AT. Anicteric sclera CV: Tachycardic. Regular rhythm. No murmurs, rubs, or gallops. No LE edema Pulmonary: Mild tachypnea. Lungs CTAB. Normal effort. Moderate expiratory wheezes throughout with restricted air movement. No rales or rhonchi. Abdominal: Soft, nontender, nondistended. Normal bowel sounds. Extremities: Palpable radial and DP pulses. Normal ROM. Skin: Warm and dry. No obvious rash or lesions. Neuro: A&Ox3. Moves all extremities. Normal sensation to light touch. No focal deficit. Psych: Normal mood and affect          Labs on Admission:  Basic Metabolic Panel: Recent Labs  Lab 07/11/24 1820  NA 140  K 4.6  CL 105  CO2 23  GLUCOSE 93  BUN 10  CREATININE 0.70  CALCIUM  9.2   Liver Function Tests: No results for input(s): AST, ALT, ALKPHOS, BILITOT, PROT, ALBUMIN in the last 168 hours. No results for input(s): LIPASE, AMYLASE in the last 168 hours. No results for input(s): AMMONIA in the last 168 hours. CBC: Recent Labs  Lab 07/11/24 1820  WBC 4.5  HGB 11.3*  HCT  37.4  MCV 90.6  PLT 253   Cardiac Enzymes: No results for input(s): CKTOTAL, CKMB, CKMBINDEX, TROPONINI in the last 168 hours. BNP (last 3 results) Recent Labs    02/17/24 1633 04/18/24 1809 06/20/24 0825  BNP 59.5 108.1* 161.8*    ProBNP (last 3 results) No results for input(s): PROBNP in the last 8760 hours.  CBG: No results for input(s): GLUCAP in the last 168 hours.  Radiological Exams on Admission: DG Chest 2 View Result Date: 07/11/2024 CLINICAL DATA:  Shortness of breath and wheezing EXAM: CHEST - 2 VIEW COMPARISON:  Chest x-ray 06/20/2024. FINDINGS: The heart size and mediastinal contours are within normal limits. Both lungs are clear. Right axillary surgical clip present. Cervical spinal fusion plate again seen. No acute fractures are identified. IMPRESSION: No active cardiopulmonary disease. Electronically Signed   By: Greig Pique M.D.   On: 07/11/2024 18:53   Assessment/Plan [name] is a 73 y.o. female with medical history significant for COPD with multiple hospitalizations for COPD exacerbation, tobacco use disorder, HTN, HLD and depression who presents to the ED for evaluation of shortness of breath and wheezing and admitted for COPD exacerbation.  # COPD exacerbation - Presented with 2 days of worsening SOB, wheezing and increased cough - Pt with moderate expiratory wheezes on lung auscultation - CXR with no evidence of PNA; Neg covid, RSV and flu test - S/p IV solumedrol, multiple DuoNebs in the ED - Start IV Solu-Medrol  40 mg twice daily - Start azithromycin  500 mg x 3 doses - Continue home bronchodilator - Schedule duonebs every 6 hours - Incentive spirometer, flutter valve - Supplemental O2 as needed  # Hypertensive urgency - BP significantly elevated with SBP in the 150s to 190s - This is in the setting of COPD exacerbation and patient not taking her BP meds this evening - Continue amlodipine , Hyzaar and Lopressor  - IV hydralazine   as needed for SBP > 180  # GERD - Continue Protonix   # Allergy - Continue Flonase  and loratadine   # Tobacco use disorder - Extensive tobacco use history, previously 1 pack/day but reports 1 pack lasts her a day and a half. - Had extensive conversation about the importance of smoking cessation as her ongoing tobacco use is the reason for her repetitive COPD exacerbations and hospitalizations. I have counseled the patient regarding the negative impacts of continued tobacco use including  but not limited to lung cancer, worsening of her COPD, and cardiovascular disease. - States she will try harder this time to quit, advised to wean slowly - Nicotine  patch offered however patient declined - Advised to follow-up with PCP for tobacco cessation resources and support   Total time spent with tobacco counseling was 4 minutes.   DVT prophylaxis: Lovenox      Code Status: Full Code  Consults called: None  Family Communication: No family at bedside  Severity of Illness: The appropriate patient status for this patient is OBSERVATION. Observation status is judged to be reasonable and necessary in order to provide the required intensity of service to ensure the patient's safety. The patient's presenting symptoms, physical exam findings, and initial radiographic and laboratory data in the context of their medical condition is felt to place them at decreased risk for further clinical deterioration. Furthermore, it is anticipated that the patient will be medically stable for discharge from the hospital within 2 midnights of admission.   Level of care: Telemetry   This record has been created using Conservation officer, historic buildings. Errors have been sought and corrected, but may not always be located. Such creation errors do not reflect on the standard of care.   Lou Claretta HERO, MD 07/11/2024, 10:08 PM [facility] Pager: [phone] Isaiah 41:10   If 7PM-7AM, please contact  night-coverage [email or web address] Password [email or web address]

## 2024-07-11 NOTE — ED Provider Notes (Signed)
 [place] EMERGENCY DEPARTMENT AT [facility] Provider Note   CSN: [id number] Arrival date & time: 07/11/24  1728     Patient presents with: No chief complaint on file.   [name] is a 73 y.o. female.   73 year old female with prior medical history as detailed below presents for evaluation.  Patient reports increased wheezing and congestion.  Symptoms began over the last 24 to 48 hours.  She reports that breathing treatments at home did not control her symptoms.  She reports that she has been on prednisone  recently.  She cannot remember exactly when.  She cannot remember her last dose of prednisone .  She denies fever.  She denies chest pain.  Last discharged on August 18 after admission for treatment of COPD exacerbation.  Patient with history of hypertension.  Patient continued to smoke daily.  The history is provided by the patient and medical records.       Prior to Admission medications   Medication Sig Start Date End Date Taking? Authorizing Provider  acetaminophen  (TYLENOL ) 500 MG tablet Take 1,000 mg by mouth daily as needed for moderate pain (pain score 4-6) or mild pain (pain score 1-3).    [provider]  albuterol  (PROVENTIL ) (2.5 MG/3ML) 0.083% nebulizer solution Take 3 mLs (2.5 mg total) by nebulization every 4 (four) hours as needed for wheezing or shortness of breath. 04/21/24   Elnora Ip, MD  albuterol  (VENTOLIN  HFA) 108 [phone] Base) MCG/ACT inhaler Inhale 2 puffs into the lungs every 6 (six) hours as needed for wheezing or shortness of breath. 01/10/24   Jolaine Pac, DO  amLODipine  (NORVASC ) 10 MG tablet Take 1 tablet (10 mg total) by mouth daily. 03/19/24 03/19/25  Fernand Prost, MD  fluticasone  (FLONASE ) 50 MCG/ACT nasal spray Place 2 sprays into both nostrils daily. 05/30/24   Sebastian Toribio GAILS, MD  fluticasone -salmeterol (ADVAIR ) 100-50 MCG/ACT AEPB Inhale 1 puff into the lungs 2 (two) times daily. Patient taking  differently: Inhale 1 puff into the lungs daily. 05/14/24   Barbarann Nest, MD  guaiFENesin -dextromethorphan  (ROBITUSSIN DM) 100-10 MG/5ML syrup Take 10 mLs by mouth every 4 (four) hours as needed for cough. Patient taking differently: Take 10 mLs by mouth. 04/22/24   Vann, Jessica U, DO  ipratropium-albuterol  (DUONEB) 0.5-2.5 (3) MG/3ML SOLN Take 3 mLs by nebulization 3 (three) times daily for 5 days, THEN 3 mLs every 6 (six) hours as needed. Patient taking differently: 3 mLs every 6 (six) hours as needed for wheezing/SOB 05/29/24 06/03/25  Sebastian Toribio GAILS, MD  loratadine  (CLARITIN ) 10 MG tablet Take 1 tablet (10 mg total) by mouth daily. 05/30/24   Sebastian Toribio GAILS, MD  losartan -hydrochlorothiazide  (HYZAAR) 50-12.5 MG tablet Take 1 tablet by mouth 2 (two) times daily. 03/20/24   Gomez-Caraballo, Maria, MD  metoprolol  tartrate (LOPRESSOR ) 50 MG tablet Take 1 tablet (50 mg total) by mouth 2 (two) times daily. 04/22/24   Vann, Jessica U, DO  pantoprazole  (PROTONIX ) 40 MG tablet Take 1 tablet (40 mg total) by mouth daily. 05/30/24   Sebastian Toribio GAILS, MD    Allergies: Ace inhibitors    Review of Systems  All other systems reviewed and are negative.   Updated Vital Signs BP (!) 176/122 (BP Location: Left Arm)   Pulse (!) 106   Temp 98.1 F (36.7 C)   Resp 19   SpO2 99%   Physical Exam Vitals and nursing note reviewed.  Constitutional:      General: She is not in acute  distress.    Appearance: Normal appearance. She is well-developed.  HENT:     Head: Normocephalic and atraumatic.  Eyes:     Conjunctiva/sclera: Conjunctivae normal.     Pupils: Pupils are equal, round, and reactive to light.  Cardiovascular:     Rate and Rhythm: Normal rate and regular rhythm.     Heart sounds: Normal heart sounds.  Pulmonary:     Effort: Pulmonary effort is normal. No respiratory distress.     Comments: Diffuse expiratory wheezes in all lung fields Abdominal:     General: There is no distension.      Palpations: Abdomen is soft.     Tenderness: There is no abdominal tenderness.  Musculoskeletal:        General: No deformity. Normal range of motion.     Cervical back: Normal range of motion and neck supple.  Skin:    General: Skin is warm and dry.  Neurological:     General: No focal deficit present.     Mental Status: She is alert and oriented to person, place, and time.     (all labs ordered are listed, but only abnormal results are displayed) Labs Reviewed  CBC - Abnormal; Notable for the following components:      Result Value   Hemoglobin 11.3 (*)    All other components within normal limits  RESP PANEL BY RT-PCR (RSV, FLU A&B, COVID)  [id number]  BASIC METABOLIC PANEL WITH GFR    EKG: EKG Interpretation Date/Time:  Wednesday July 11 2024 17:59:51 EDT Ventricular Rate:  103 PR Interval:  143 QRS Duration:  87 QT Interval:  347 QTC Calculation: 455 R Axis:   73  Text Interpretation: Sinus tachycardia Paired ventricular premature complexes Right atrial enlargement Consider left ventricular hypertrophy Anterior Q waves, possibly due to LVH Nonspecific T abnormalities, lateral leads Confirmed by Laurice Coy [phone]) on 07/11/2024 6:37:53 PM  Radiology: No results found.   Procedures   Medications Ordered in the ED  albuterol  (VENTOLIN  HFA) 108 (90 Base) MCG/ACT inhaler 2 puff (2 puffs Inhalation Given 07/11/24 1759)  ipratropium-albuterol  (DUONEB) 0.5-2.5 (3) MG/3ML nebulizer solution 3 mL (has no administration in time range)  methylPREDNISolone  sodium succinate (SOLU-MEDROL ) 125 mg/2 mL injection 125 mg (has no administration in time range)                                    Medical Decision Making Patient with longstanding history of COPD.  Patient presents with symptoms suggestive of COPD exacerbation.  Patient's chest x-ray is without acute abnormality.  Obtained screening labs are without significant acute abnormality.  Patient is minimally  improved with initial treatment.  She desires admission.  Hospitalist service made aware of case will evaluate for admission.  Amount and/or Complexity of Data Reviewed Labs: ordered. Radiology: ordered.  Risk Prescription drug management. Decision regarding hospitalization.        Final diagnoses:  COPD exacerbation [facility])    ED Discharge Orders     None          Laurice Coy BROCKS, MD 07/11/24 2125

## 2024-07-11 NOTE — ED Triage Notes (Signed)
 Patient states she started feeling bad several days ago and now she can't breath. Wheezing and short of breath at triage. Patient has COPD.

## 2024-07-11 NOTE — ED Notes (Signed)
 ED Provider at bedside.

## 2024-07-12 DIAGNOSIS — I160 Hypertensive urgency: Secondary | ICD-10-CM | POA: Diagnosis present

## 2024-07-12 DIAGNOSIS — Z9221 Personal history of antineoplastic chemotherapy: Secondary | ICD-10-CM | POA: Diagnosis not present

## 2024-07-12 DIAGNOSIS — J441 Chronic obstructive pulmonary disease with (acute) exacerbation: Secondary | ICD-10-CM | POA: Diagnosis present

## 2024-07-12 DIAGNOSIS — I10 Essential (primary) hypertension: Secondary | ICD-10-CM | POA: Diagnosis present

## 2024-07-12 DIAGNOSIS — Z923 Personal history of irradiation: Secondary | ICD-10-CM | POA: Diagnosis not present

## 2024-07-12 DIAGNOSIS — Z91148 Patient's other noncompliance with medication regimen for other reason: Secondary | ICD-10-CM | POA: Diagnosis not present

## 2024-07-12 DIAGNOSIS — Z7951 Long term (current) use of inhaled steroids: Secondary | ICD-10-CM | POA: Diagnosis not present

## 2024-07-12 DIAGNOSIS — Z853 Personal history of malignant neoplasm of breast: Secondary | ICD-10-CM | POA: Diagnosis not present

## 2024-07-12 DIAGNOSIS — Z79899 Other long term (current) drug therapy: Secondary | ICD-10-CM | POA: Diagnosis not present

## 2024-07-12 DIAGNOSIS — F17210 Nicotine dependence, cigarettes, uncomplicated: Secondary | ICD-10-CM | POA: Diagnosis present

## 2024-07-12 DIAGNOSIS — Z981 Arthrodesis status: Secondary | ICD-10-CM | POA: Diagnosis not present

## 2024-07-12 DIAGNOSIS — Z888 Allergy status to other drugs, medicaments and biological substances status: Secondary | ICD-10-CM | POA: Diagnosis not present

## 2024-07-12 DIAGNOSIS — J9601 Acute respiratory failure with hypoxia: Secondary | ICD-10-CM | POA: Diagnosis present

## 2024-07-12 DIAGNOSIS — Z1152 Encounter for screening for COVID-19: Secondary | ICD-10-CM | POA: Diagnosis not present

## 2024-07-12 DIAGNOSIS — Z803 Family history of malignant neoplasm of breast: Secondary | ICD-10-CM | POA: Diagnosis not present

## 2024-07-12 DIAGNOSIS — T465X6A Underdosing of other antihypertensive drugs, initial encounter: Secondary | ICD-10-CM | POA: Diagnosis present

## 2024-07-12 DIAGNOSIS — E119 Type 2 diabetes mellitus without complications: Secondary | ICD-10-CM | POA: Diagnosis present

## 2024-07-12 DIAGNOSIS — K219 Gastro-esophageal reflux disease without esophagitis: Secondary | ICD-10-CM | POA: Diagnosis present

## 2024-07-12 DIAGNOSIS — Z90710 Acquired absence of both cervix and uterus: Secondary | ICD-10-CM | POA: Diagnosis not present

## 2024-07-12 DIAGNOSIS — E785 Hyperlipidemia, unspecified: Secondary | ICD-10-CM | POA: Diagnosis present

## 2024-07-12 LAB — BASIC METABOLIC PANEL WITH GFR
Anion gap: 15 (ref 5–15)
BUN: 15 mg/dL (ref 8–23)
CO2: 21 mmol/L — ABNORMAL LOW (ref 22–32)
Calcium: 9.3 mg/dL (ref 8.9–10.3)
Chloride: 100 mmol/L (ref 98–111)
Creatinine, Ser: 0.73 mg/dL (ref 0.44–1.00)
GFR, Estimated: >60 mL/min (ref >60)
Glucose, Bld: 204 mg/dL — ABNORMAL HIGH (ref 70–99)
Potassium: 4.7 mmol/L (ref 3.5–5.1)
Sodium: 136 mmol/L (ref 135–145)

## 2024-07-12 LAB — CBC
HCT: 36.5 % (ref 36.0–46.0)
Hemoglobin: 11.7 g/dL — ABNORMAL LOW (ref 12.0–15.0)
MCH: 28.9 pg (ref 26.0–34.0)
MCHC: 32.1 g/dL (ref 30.0–36.0)
MCV: 90.1 fL (ref 80.0–100.0)
Platelets: 269 K/uL (ref 150–400)
RBC: 4.05 MIL/uL (ref 3.87–5.11)
RDW: 14.9 % (ref 11.5–15.5)
WBC: 3.6 K/uL — ABNORMAL LOW (ref 4.0–10.5)
nRBC: 0 % (ref 0.0–0.2)

## 2024-07-12 MED ORDER — IPRATROPIUM-ALBUTEROL 0.5-2.5 (3) MG/3ML IN SOLN
3.0000 mL | RESPIRATORY_TRACT | Status: DC
  Administered 2024-07-12 – 2024-07-14 (×12): 3 mL via RESPIRATORY_TRACT
  Filled 2024-07-12 (×10): qty 3

## 2024-07-12 MED ORDER — IPRATROPIUM-ALBUTEROL 0.5-2.5 (3) MG/3ML IN SOLN
3.0000 mL | RESPIRATORY_TRACT | Status: DC | PRN
  Administered 2024-07-13 – 2024-07-14 (×2): 3 mL via RESPIRATORY_TRACT
  Filled 2024-07-12 (×4): qty 3

## 2024-07-12 MED ORDER — GUAIFENESIN-DM 100-10 MG/5ML PO SYRP
5.0000 mL | ORAL_SOLUTION | ORAL | Status: DC | PRN
  Administered 2024-07-12 – 2024-07-14 (×5): 5 mL via ORAL
  Filled 2024-07-12 (×5): qty 5

## 2024-07-12 MED ORDER — PREDNISONE 20 MG PO TABS
20.0000 mg | ORAL_TABLET | Freq: Two times a day (BID) | ORAL | Status: DC
  Administered 2024-07-12 – 2024-07-14 (×4): 20 mg via ORAL
  Filled 2024-07-12 (×4): qty 1

## 2024-07-12 MED ORDER — UMECLIDINIUM-VILANTEROL 62.5-25 MCG/ACT IN AEPB
1.0000 | INHALATION_SPRAY | Freq: Every day | RESPIRATORY_TRACT | Status: DC
  Administered 2024-07-13 – 2024-07-14 (×2): 1 via RESPIRATORY_TRACT
  Filled 2024-07-12: qty 14

## 2024-07-12 MED ORDER — BUDESONIDE 0.5 MG/2ML IN SUSP
0.5000 mg | Freq: Two times a day (BID) | RESPIRATORY_TRACT | Status: DC
  Administered 2024-07-12 – 2024-07-14 (×5): 0.5 mg via RESPIRATORY_TRACT
  Filled 2024-07-12 (×5): qty 2

## 2024-07-12 NOTE — Progress Notes (Signed)
 PROGRESS NOTE    [name]  FMW:[id number] DOB: Sep 20, 1951 DOA: 07/11/2024 PCP: Elnora Ip, MD   Brief Narrative:  [name] is a 73 y.o. female who presents to the hospital for acute respiratory failure for the fourth time in the past 2 months.  Patient has known medical history significant for COPD on room air, ongoing tobacco use disorder, HTN, HLD and depression.  Hospitalist called for admission given respiratory distress.  Assessment & Plan:   Active Problems:   COPD exacerbation (HCC)  Acute recurrent COPD exacerbation - Fourth admission in 2 months, pulmonology sidelined for outpatient follow-up scheduling - Patient appears to be poorly compliant with medications due to likely cost versus availability - Will initiate inhaled steroids, triple therapy as well as IV steroids - Pending respiratory improvement likely discharge back home in the next 24 to 48 hours - Imaging and micro thus far negative(RSV, flu, COVID-negative) although certainly could be triggered by non-testable viral infection but less likely given the lack of symptoms - Increase DuoNebs to Q4 scheduled, every 2 as needed - Start azithromycin  500 mg x 3 doses - Incentive spirometer, flutter valve - Has not yet required oxygen , low threshold to supplement if necessary - Patient continues to smoke, lengthy discussion in regards to smoking cessation   Hypertensive urgency - Likely reactive secondary to above respiratory distress/air hunger - Exacerbated by medication noncompliance - Improving with supportive care and resumption of home meds (amlodipine , hydrochlorothiazide , losartan , metoprolol )   GERD - Continue Protonix  Seasonal allergies - Continue Flonase  and loratadine   DVT prophylaxis: enoxaparin  (LOVENOX ) injection 40 mg Start: 07/11/24 2200 Code Status:   Code Status: Full Code Family Communication: None present  Status is: Inpatient  Dispo: The patient is from: Home               Anticipated d/c is to: Home              Anticipated d/c date is: 24 to 48 hours              Patient currently not medically stable for discharge  Consultants:  None  Procedures:  None  Antimicrobials:  Azithromycin  x 3 days  Subjective: No acute issues or events overnight denies nausea vomiting diarrhea constipation headache fevers chills or chest pain.  Respiratory distress improving although still quite symptomatic with even minimal exertion around the room  Objective: Vitals:   07/11/24 2232 07/11/24 2306 07/12/24 0217 07/12/24 0359  BP:  (!) 184/89  (!) 169/77  Pulse:  89  83  Resp:  (!) 21  (!) 24  Temp:  98 F (36.7 C)  97.9 F (36.6 C)  TempSrc:      SpO2: 98% 100% 98% 100%    Intake/Output Summary (Last 24 hours) at 07/12/2024 0740 Last data filed at 07/12/2024 0100 Gross per 24 hour  Intake --  Output 400 ml  Net -400 ml   There were no vitals filed for this visit.  Examination:  General:  Pleasantly resting in bed, No acute distress. HEENT:  Normocephalic atraumatic.  Sclerae nonicteric, noninjected.  Extraocular movements intact bilaterally. Neck:  Without mass or deformity.  Trachea is midline. Lungs: Bilateral fulminant expiratory wheeze. Heart:  Regular rate and rhythm.  Without murmurs, rubs, or gallops. Abdomen:  Soft, nontender, nondistended.  Without guarding or rebound. Extremities: Without cyanosis, clubbing, edema, or obvious deformity. Skin:  Warm and dry, no erythema.   Data Reviewed: I have personally reviewed following labs and imaging  studies  CBC: Recent Labs  Lab 07/11/24 1820 07/12/24 0526  WBC 4.5 3.6*  HGB 11.3* 11.7*  HCT 37.4 36.5  MCV 90.6 90.1  PLT 253 269   Basic Metabolic Panel: Recent Labs  Lab 07/11/24 1820 07/12/24 0526  NA 140 136  K 4.6 4.7  CL 105 100  CO2 23 21*  GLUCOSE 93 204*  BUN 10 15  CREATININE 0.70 0.73  CALCIUM  9.2 9.3   GFR: CrCl cannot be calculated (Unknown ideal  weight.).  Recent Results (from the past 240 hours)  Resp panel by RT-PCR (RSV, Flu A&B, Covid) Anterior Nasal Swab     Status: None   Collection Time: 07/11/24  8:12 PM   Specimen: Anterior Nasal Swab  Result Value Ref Range Status   SARS Coronavirus 2 by RT PCR NEGATIVE NEGATIVE Final    Comment: (NOTE) SARS-CoV-2 target nucleic acids are NOT DETECTED.  The SARS-CoV-2 RNA is generally detectable in upper respiratory specimens during the acute phase of infection. The lowest concentration of SARS-CoV-2 viral copies this assay can detect is 138 copies/mL. A negative result does not preclude SARS-Cov-2 infection and should not be used as the sole basis for treatment or other patient management decisions. A negative result may occur with  improper specimen collection/handling, submission of specimen other than nasopharyngeal swab, presence of viral mutation(s) within the areas targeted by this assay, and inadequate number of viral copies(<138 copies/mL). A negative result must be combined with clinical observations, patient history, and epidemiological information. The expected result is Negative.  Fact Sheet for Patients:  BloggerCourse.com  Fact Sheet for Healthcare Providers:  SeriousBroker.it  This test is no t yet approved or cleared by the [place]  FDA and  has been authorized for detection and/or diagnosis of SARS-CoV-2 by FDA under an Emergency Use Authorization (EUA). This EUA will remain  in effect (meaning this test can be used) for the duration of the COVID-19 declaration under Section 564(b)(1) of the Act, 21 U.S.C.section 360bbb-3(b)(1), unless the authorization is terminated  or revoked sooner.       Influenza A by PCR NEGATIVE NEGATIVE Final   Influenza B by PCR NEGATIVE NEGATIVE Final    Comment: (NOTE) The Xpert Xpress SARS-CoV-2/FLU/RSV plus assay is intended as an aid in the diagnosis of influenza from  Nasopharyngeal swab specimens and should not be used as a sole basis for treatment. Nasal washings and aspirates are unacceptable for Xpert Xpress SARS-CoV-2/FLU/RSV testing.  Fact Sheet for Patients: BloggerCourse.com  Fact Sheet for Healthcare Providers: SeriousBroker.it  This test is not yet approved or cleared by the [place]  FDA and has been authorized for detection and/or diagnosis of SARS-CoV-2 by FDA under an Emergency Use Authorization (EUA). This EUA will remain in effect (meaning this test can be used) for the duration of the COVID-19 declaration under Section 564(b)(1) of the Act, 21 U.S.C. section 360bbb-3(b)(1), unless the authorization is terminated or revoked.     Resp Syncytial Virus by PCR NEGATIVE NEGATIVE Final    Comment: (NOTE) Fact Sheet for Patients: BloggerCourse.com  Fact Sheet for Healthcare Providers: SeriousBroker.it  This test is not yet approved or cleared by the [place]  FDA and has been authorized for detection and/or diagnosis of SARS-CoV-2 by FDA under an Emergency Use Authorization (EUA). This EUA will remain in effect (meaning this test can be used) for the duration of the COVID-19 declaration under Section 564(b)(1) of the Act, 21 U.S.C. section 360bbb-3(b)(1), unless the authorization is terminated or revoked.  Performed at [facility], [address] [place], [place] [address]          Radiology Studies: DG Chest 2 View Result Date: 07/11/2024 CLINICAL DATA:  Shortness of breath and wheezing EXAM: CHEST - 2 VIEW COMPARISON:  Chest x-ray 06/20/2024. FINDINGS: The heart size and mediastinal contours are within normal limits. Both lungs are clear. Right axillary surgical clip present. Cervical spinal fusion plate again seen. No acute fractures are identified. IMPRESSION: No active cardiopulmonary disease.  Electronically Signed   By: Greig Pique M.D.   On: 07/11/2024 18:53        Scheduled Meds:  amLODipine   10 mg Oral Daily   azithromycin   500 mg Oral Daily   enoxaparin  (LOVENOX ) injection  40 mg Subcutaneous Q24H   fluticasone   2 spray Each Nare Daily   fluticasone  furoate-vilanterol  1 puff Inhalation Daily   hydrochlorothiazide   12.5 mg Oral BID   ipratropium-albuterol   3 mL Nebulization Q6H   loratadine   10 mg Oral Daily   losartan   50 mg Oral BID   methylPREDNISolone  (SOLU-MEDROL ) injection  40 mg Intravenous Q12H   metoprolol  tartrate  50 mg Oral BID   pantoprazole   40 mg Oral Daily   Continuous Infusions:   LOS: 0 days   Time spent:  Elsie JAYSON Montclair, DO [facility]  If 7PM-7AM, please contact night-coverage [email or web address]  07/12/2024, 7:40 AM

## 2024-07-12 NOTE — Plan of Care (Signed)
   Problem: Clinical Measurements: Goal: Ability to maintain clinical measurements within normal limits will improve Outcome: Progressing Goal: Will remain free from infection Outcome: Progressing Goal: Diagnostic test results will improve Outcome: Progressing Goal: Respiratory complications will improve Outcome: Progressing

## 2024-07-12 NOTE — Progress Notes (Signed)
  Patient Saturations on Room Air at Rest = 98% 92  Patient Saturations on Room Air while Ambulating = 95% 114  Patient Saturations on 0 Liters of oxygen  while Ambulating = 97% HR 114  Please briefly explain why patient needs home oxygen :

## 2024-07-12 NOTE — TOC Initial Note (Addendum)
 Transition of Care [facility]) - Initial/Assessment Note    Patient Details  Name: [name] MRN: [id number] Date of Birth: 08-31-1951  Transition of [facility]) CM/SW Contact:    Alfonse JONELLE Rex, RN Phone Number: 07/12/2024, 1:25 PM  Clinical Narrative:  Met with patient at bedside to introduce role of TOC/NCM and review for dc planning, patient confirmed she has an established PCP, no current home care services, home DME: RW, [facility]. Patient reports she resides with her spouse and feels safe returning home. Family to provided transportation home. TOC will continue to follow.   -1:28pm TOC consult for copay check-  Price for advair  vs breo cost. Note placed by [facility] Case management assistant.     Expected Discharge Plan: Home/Self Care Barriers to Discharge: Continued Medical Work up   Patient Goals and CMS Choice Patient states their goals for this hospitalization and ongoing recovery are:: return home          Expected Discharge Plan and Services       Living arrangements for the past 2 months: Single Family Home                                      Prior Living Arrangements/Services Living arrangements for the past 2 months: Single Family Home Lives with:: Spouse Patient language and need for interpreter reviewed:: Yes Do you feel safe going back to the place where you live?: Yes      Need for Family Participation in Patient Care: Yes (Comment) Care giver support system in place?: Yes (comment) Current home services: DME (RW, [facility]) Criminal Activity/Legal Involvement Pertinent to Current Situation/Hospitalization: No - Comment as needed  Activities of Daily Living   ADL Screening (condition at time of admission) Independently performs ADLs?: Yes (appropriate for developmental age) Is the patient deaf or have difficulty hearing?: No Does the patient have difficulty seeing, even when wearing glasses/contacts?: No Does the patient have difficulty  concentrating, remembering, or making decisions?: No  Permission Sought/Granted                  Emotional Assessment Appearance:: Appears stated age Attitude/Demeanor/Rapport: Engaged Affect (typically observed): Accepting Orientation: : Oriented to Self, Oriented to Place, Oriented to  Time, Oriented to Situation Alcohol  / Substance Use: Not Applicable Psych Involvement: No (comment)  Admission diagnosis:  COPD exacerbation (HCC) [J44.1] Patient Active Problem List   Diagnosis Date Noted   Essential hypertension 05/25/2024   COPD with acute exacerbation (HCC) 05/14/2024   Hypertensive urgency 04/19/2024   COPD exacerbation (HCC) 04/18/2024   Memory changes 03/28/2023   Age-related osteoporosis without current pathological fracture  03/06/2021   Healthcare maintenance 03/06/2021   Chronic hip pain, right 06/22/2019   Diabetic neuropathy (HCC) 05/21/2019   Type 2 diabetes mellitus with complication, without long-term current use of insulin  (HCC) 12/26/2018   Chronic sinusitis    COPD (chronic obstructive pulmonary disease) (HCC) 12/24/2014   Acute hypoxic respiratory failure (HCC)    Dyslipidemia associated with type 2 diabetes mellitus (HCC) 06/14/2014   Breast nodule 12/01/2012   Primary osteoarthritis of right knee 09/29/2012   Anxiety 12/30/2009   Tobacco use disorder 04/06/2009   Hypertension associated with chronic kidney disease due to type 2 diabetes mellitus (HCC) 04/06/2009   PCP:  Elnora Ip, MD Pharmacy:   [facility] [phone] - [place], [place] - [address] AT [address]  [place] [address] Phone: [phone] Fax: [phone]  [facility] [address] [place] - [address] [place] [address] Phone: [phone] Fax: [phone]  D[facility]YLE LONG - [facility] [address] [place] [address] Phone: [phone] Fax:  [phone]     Social Drivers of Health (SDOH) Social History: SDOH Screenings   Food Insecurity: Food Insecurity Present (07/12/2024)  Housing: Low Risk  (07/12/2024)  Transportation Needs: No Transportation Needs (07/12/2024)  Utilities: Not At Risk (07/12/2024)  Alcohol  Screen: Low Risk  (12/13/2022)  Depression (PHQ2-9): Medium Risk (06/12/2024)  Financial Resource Strain: Low Risk  (12/13/2022)  Physical Activity: Inactive (05/28/2024)  Social Connections: Moderately Integrated (07/12/2024)  Stress: Stress Concern Present (06/12/2024)  Tobacco Use: High Risk (06/20/2024)   SDOH Interventions:     Readmission Risk Interventions    06/21/2024    1:08 PM 05/13/2024   12:43 PM 08/20/2022   11:07 AM  Readmission Risk Prevention Plan  Post Dischage Appt   Complete  Medication Screening   Complete  Transportation Screening Complete Complete Complete  PCP or Specialist Appt within 5-7 Days  Complete   Home Care Screening  Complete   Medication Review (RN CM)  Complete   HRI or Home Care Consult Complete    Social Work Consult for Recovery Care Planning/Counseling Complete    Palliative Care Screening Not Applicable    Medication Review Oceanographer) Complete

## 2024-07-12 NOTE — Plan of Care (Signed)
   Problem: Education: Goal: Knowledge of General Education information will improve Description Including pain rating scale, medication(s)/side effects and non-pharmacologic comfort measures Outcome: Progressing

## 2024-07-12 NOTE — TOC Benefit Eligibility Note (Signed)
 Transition of Care [facility]) Benefit Eligibility Note    Patient Details  Name: [name] MRN: [id number] Date of Birth: 05-May-1951   Medication/Dose: Ranell        Prescription Coverage Preferred Pharmacy: local  Spoke with Person/Company/Phone Number:: [phone]  Co-Pay: advair  not on formulary, Breo covered copay $47.00  Prior Approval: No  Deductible: Met       Melba Ates Phone Number: 07/12/2024, 12:20 PM

## 2024-07-13 DIAGNOSIS — J441 Chronic obstructive pulmonary disease with (acute) exacerbation: Secondary | ICD-10-CM | POA: Diagnosis not present

## 2024-07-13 NOTE — Progress Notes (Signed)
 PROGRESS NOTE    [name]  FMW:[id number] DOB: [birth year] DOA: 07/11/2024 PCP: Elnora Ip, MD   Brief Narrative:  [name] is a 73 y.o. female who presents to the hospital for acute respiratory failure for the fourth time in the past 2 months.  Patient has known medical history significant for COPD on room air, ongoing tobacco use disorder, HTN, HLD and depression.  Hospitalist called for admission given respiratory distress.  Assessment & Plan:   Active Problems:   COPD exacerbation (HCC)  Acute recurrent COPD exacerbation - Fourth admission in 2 months, pulmonology sidelined for outpatient follow-up scheduling - Patient appears to be poorly compliant with medications due to likely cost versus availability - Will initiate inhaled steroids, triple therapy as well as IV steroids - Pending respiratory improvement likely discharge back home in the next 24 to 48 hours - Imaging and micro thus far negative(RSV, flu, COVID-negative) although certainly could be triggered by non-testable viral infection but less likely given the lack of symptoms - Increase DuoNebs to Q4 scheduled, every 2 as needed - Start azithromycin  500 mg x 3 doses - Incentive spirometer, flutter valve - Has not yet required oxygen , low threshold to supplement if necessary - Patient continues to smoke, lengthy discussion in regards to smoking cessation   Hypertensive urgency - Likely reactive secondary to above respiratory distress/air hunger - Exacerbated by medication noncompliance - Improving with supportive care and resumption of home meds (amlodipine , hydrochlorothiazide , losartan , metoprolol )   GERD - Continue Protonix  Seasonal allergies - Continue Flonase  and loratadine   DVT prophylaxis: enoxaparin  (LOVENOX ) injection 40 mg Start: 07/11/24 2200 Code Status:   Code Status: Full Code Family Communication: None present  Status is: Inpatient  Dispo: The patient is from: Home               Anticipated d/c is to: Home              Anticipated d/c date is: 24 to 48 hours              Patient currently not medically stable for discharge  Consultants:  None  Procedures:  None  Antimicrobials:  Azithromycin  x 3 days  Subjective: No acute issues or events overnight denies nausea vomiting diarrhea constipation headache fevers chills or chest pain.  Respiratory distress improving although still quite symptomatic with even minimal exertion around the room  Objective: Vitals:   07/12/24 1658 07/12/24 1845 07/12/24 2022 07/13/24 0526  BP: (!) 159/71  (!) 178/74 (!) 162/81  Pulse: 88  100 91  Resp: 20  18 20   Temp: 98 F (36.7 C)  98.7 F (37.1 C) 98.3 F (36.8 C)  TempSrc:    Oral  SpO2: 100% 98% 99% 100%   No intake or output data in the 24 hours ending 07/13/24 0751  There were no vitals filed for this visit.  Examination:  General:  Pleasantly resting in bed, No acute distress. HEENT:  Normocephalic atraumatic.  Sclerae nonicteric, noninjected.  Extraocular movements intact bilaterally. Neck:  Without mass or deformity.  Trachea is midline. Lungs: Bilateral fulminant expiratory wheeze. Heart:  Regular rate and rhythm.  Without murmurs, rubs, or gallops. Abdomen:  Soft, nontender, nondistended.  Without guarding or rebound. Extremities: Without cyanosis, clubbing, edema, or obvious deformity. Skin:  Warm and dry, no erythema.   Data Reviewed: I have personally reviewed following labs and imaging studies  CBC: Recent Labs  Lab 07/11/24 1820 07/12/24 0526  WBC 4.5 3.6*  HGB  11.3* 11.7*  HCT 37.4 36.5  MCV 90.6 90.1  PLT 253 269   Basic Metabolic Panel: Recent Labs  Lab 07/11/24 1820 07/12/24 0526  NA 140 136  K 4.6 4.7  CL 105 100  CO2 23 21*  GLUCOSE 93 204*  BUN 10 15  CREATININE 0.70 0.73  CALCIUM  9.2 9.3   GFR: CrCl cannot be calculated (Unknown ideal weight.).  Recent Results (from the past 240 hours)  Resp panel by RT-PCR  (RSV, Flu A&B, Covid) Anterior Nasal Swab     Status: None   Collection Time: 07/11/24  8:12 PM   Specimen: Anterior Nasal Swab  Result Value Ref Range Status   SARS Coronavirus 2 by RT PCR NEGATIVE NEGATIVE Final    Comment: (NOTE) SARS-CoV-2 target nucleic acids are NOT DETECTED.  The SARS-CoV-2 RNA is generally detectable in upper respiratory specimens during the acute phase of infection. The lowest concentration of SARS-CoV-2 viral copies this assay can detect is 138 copies/mL. A negative result does not preclude SARS-Cov-2 infection and should not be used as the sole basis for treatment or other patient management decisions. A negative result may occur with  improper specimen collection/handling, submission of specimen other than nasopharyngeal swab, presence of viral mutation(s) within the areas targeted by this assay, and inadequate number of viral copies(<138 copies/mL). A negative result must be combined with clinical observations, patient history, and epidemiological information. The expected result is Negative.  Fact Sheet for Patients:  BloggerCourse.com  Fact Sheet for Healthcare Providers:  SeriousBroker.it  This test is no t yet approved or cleared by the [place]  FDA and  has been authorized for detection and/or diagnosis of SARS-CoV-2 by FDA under an Emergency Use Authorization (EUA). This EUA will remain  in effect (meaning this test can be used) for the duration of the COVID-19 declaration under Section 564(b)(1) of the Act, 21 U.S.C.section 360bbb-3(b)(1), unless the authorization is terminated  or revoked sooner.       Influenza A by PCR NEGATIVE NEGATIVE Final   Influenza B by PCR NEGATIVE NEGATIVE Final    Comment: (NOTE) The Xpert Xpress SARS-CoV-2/FLU/RSV plus assay is intended as an aid in the diagnosis of influenza from Nasopharyngeal swab specimens and should not be used as a sole basis for  treatment. Nasal washings and aspirates are unacceptable for Xpert Xpress SARS-CoV-2/FLU/RSV testing.  Fact Sheet for Patients: BloggerCourse.com  Fact Sheet for Healthcare Providers: SeriousBroker.it  This test is not yet approved or cleared by the [place]  FDA and has been authorized for detection and/or diagnosis of SARS-CoV-2 by FDA under an Emergency Use Authorization (EUA). This EUA will remain in effect (meaning this test can be used) for the duration of the COVID-19 declaration under Section 564(b)(1) of the Act, 21 U.S.C. section 360bbb-3(b)(1), unless the authorization is terminated or revoked.     Resp Syncytial Virus by PCR NEGATIVE NEGATIVE Final    Comment: (NOTE) Fact Sheet for Patients: BloggerCourse.com  Fact Sheet for Healthcare Providers: SeriousBroker.it  This test is not yet approved or cleared by the [place]  FDA and has been authorized for detection and/or diagnosis of SARS-CoV-2 by FDA under an Emergency Use Authorization (EUA). This EUA will remain in effect (meaning this test can be used) for the duration of the COVID-19 declaration under Section 564(b)(1) of the Act, 21 U.S.C. section 360bbb-3(b)(1), unless the authorization is terminated or revoked.  Performed at [facility], [address] [place], [place] [address]  Radiology Studies: DG Chest 2 View Result Date: 07/11/2024 CLINICAL DATA:  Shortness of breath and wheezing EXAM: CHEST - 2 VIEW COMPARISON:  Chest x-ray 06/20/2024. FINDINGS: The heart size and mediastinal contours are within normal limits. Both lungs are clear. Right axillary surgical clip present. Cervical spinal fusion plate again seen. No acute fractures are identified. IMPRESSION: No active cardiopulmonary disease. Electronically Signed   By: Greig Pique M.D.   On: 07/11/2024 18:53         Scheduled Meds:  amLODipine   10 mg Oral Daily   azithromycin   500 mg Oral Daily   budesonide   0.5 mg Nebulization BID   enoxaparin  (LOVENOX ) injection  40 mg Subcutaneous Q24H   fluticasone   2 spray Each Nare Daily   hydrochlorothiazide   12.5 mg Oral BID   ipratropium-albuterol   3 mL Nebulization Q4H while awake   loratadine   10 mg Oral Daily   losartan   50 mg Oral BID   metoprolol  tartrate  50 mg Oral BID   pantoprazole   40 mg Oral Daily   predniSONE   20 mg Oral BID WC   umeclidinium-vilanterol  1 puff Inhalation Daily   Continuous Infusions:   LOS: 1 day   Time spent:  Elsie JAYSON Mont[place], DO [facility]  If 7PM-7AM, please contact night-coverage [email or web address]  07/13/2024, 7:51 AM

## 2024-07-13 NOTE — Plan of Care (Signed)

## 2024-07-13 NOTE — Progress Notes (Signed)
 Pt found her green acapella that she brought in from home, with good understanding.  Non-productive cough noted.

## 2024-07-13 NOTE — Plan of Care (Signed)
  Problem: Education: Goal: Knowledge of General Education information will improve Description: Including pain rating scale, medication(s)/side effects and non-pharmacologic comfort measures Outcome: Progressing   Problem: Clinical Measurements: Goal: Ability to maintain clinical measurements within normal limits will improve Outcome: Progressing Goal: Will remain free from infection Outcome: Progressing Goal: Diagnostic test results will improve Outcome: Progressing Goal: Respiratory complications will improve Outcome: Progressing Goal: Cardiovascular complication will be avoided Outcome: Progressing   Problem: Nutrition: Goal: Adequate nutrition will be maintained Outcome: Progressing   Problem: Elimination: Goal: Will not experience complications related to bowel motility Outcome: Progressing Goal: Will not experience complications related to urinary retention Outcome: Progressing   Problem: Pain Managment: Goal: General experience of comfort will improve and/or be controlled Outcome: Progressing   Problem: Safety: Goal: Ability to remain free from injury will improve Outcome: Progressing   Problem: Skin Integrity: Goal: Risk for impaired skin integrity will decrease Outcome: Progressing   Problem: Education: Goal: Knowledge of disease or condition will improve Outcome: Progressing Goal: Knowledge of the prescribed therapeutic regimen will improve Outcome: Progressing Goal: Individualized Educational Video(s) Outcome: Progressing   Problem: Activity: Goal: Ability to tolerate increased activity will improve Outcome: Progressing Goal: Will verbalize the importance of balancing activity with adequate rest periods Outcome: Progressing   Problem: Respiratory: Goal: Ability to maintain a clear airway will improve Outcome: Progressing Goal: Ability to maintain adequate ventilation will improve Outcome: Progressing   Problem: Health Behavior/Discharge  Planning: Goal: Ability to manage health-related needs will improve Outcome: Not Progressing   Problem: Activity: Goal: Risk for activity intolerance will decrease Outcome: Not Progressing   Problem: Coping: Goal: Level of anxiety will decrease Outcome: Not Progressing   Problem: Respiratory: Goal: Levels of oxygenation will improve Outcome: Not Progressing

## 2024-07-14 ENCOUNTER — Other Ambulatory Visit (HOSPITAL_COMMUNITY): Payer: Self-pay

## 2024-07-14 DIAGNOSIS — J441 Chronic obstructive pulmonary disease with (acute) exacerbation: Secondary | ICD-10-CM | POA: Diagnosis not present

## 2024-07-14 MED ORDER — UMECLIDINIUM-VILANTEROL 62.5-25 MCG/ACT IN AEPB
1.0000 | INHALATION_SPRAY | Freq: Every day | RESPIRATORY_TRACT | 0 refills | Status: DC
  Filled 2024-07-14: qty 60, 30d supply, fill #0

## 2024-07-14 MED ORDER — PREDNISONE 10 MG PO TABS
ORAL_TABLET | ORAL | 0 refills | Status: AC
  Filled 2024-07-14: qty 30, 12d supply, fill #0

## 2024-07-14 MED ORDER — VARENICLINE TARTRATE (STARTER) 0.5 MG X 11 & 1 MG X 42 PO TBPK
1.0000 | ORAL_TABLET | ORAL | 0 refills | Status: DC
  Filled 2024-07-14: qty 53, 28d supply, fill #0

## 2024-07-14 MED ORDER — ARNUITY ELLIPTA 100 MCG/ACT IN AEPB
1.0000 | INHALATION_SPRAY | Freq: Every day | RESPIRATORY_TRACT | 1 refills | Status: DC
  Filled 2024-07-14: qty 30, 30d supply, fill #0

## 2024-07-14 NOTE — Progress Notes (Signed)
SATURATION QUALIFICATIONS: (This note is used to comply with regulatory documentation for home oxygen) ° °Patient Saturations on Room Air at Rest = 99% ° °Patient Saturations on Room Air while Ambulating = 99% ° °Patient Saturations on 0 Liters of oxygen while Ambulating = 99% ° °Please briefly explain why patient needs home oxygen: °

## 2024-07-14 NOTE — TOC Transition Note (Signed)
 Transition of Care [facility]) - Discharge Note   Patient Details  Name: [name] MRN: [id number] Date of Birth: 13-Jun-1951  [facility] CM/SW Contact:  Sonda Manuella Quill, RN Phone Number: 07/14/2024, 12:38 PM   Clinical Narrative:    D/C orders received; no TOC needs.   Final next level of care: Home/Self Care Barriers to Discharge: No Barriers Identified   Patient Goals and CMS Choice Patient states their goals for this hospitalization and ongoing recovery are:: return home          Discharge Placement                       Discharge Plan and Services Additional resources added to the After Visit Summary for                                       Social Drivers of Health (SDOH) Interventions SDOH Screenings   Food Insecurity: Food Insecurity Present (07/12/2024)  Housing: Low Risk  (07/12/2024)  Transportation Needs: No Transportation Needs (07/12/2024)  Utilities: Not At Risk (07/12/2024)  Alcohol  Screen: Low Risk  (12/13/2022)  Depression (PHQ2-9): Medium Risk (06/12/2024)  Financial Resource Strain: Low Risk  (12/13/2022)  Physical Activity: Inactive (05/28/2024)  Social Connections: Moderately Integrated (07/12/2024)  Stress: Stress Concern Present (06/12/2024)  Tobacco Use: High Risk (06/20/2024)     Readmission Risk Interventions    06/21/2024    1:08 PM 05/13/2024   12:43 PM 08/20/2022   11:07 AM  Readmission Risk Prevention Plan  Post Dischage Appt   Complete  Medication Screening   Complete  Transportation Screening Complete Complete Complete  PCP or Specialist Appt within 5-7 Days  Complete   Home Care Screening  Complete   Medication Review (RN CM)  Complete   HRI or Home Care Consult Complete    Social Work Consult for Recovery Care Planning/Counseling Complete    Palliative Care Screening Not Applicable    Medication Review Oceanographer) Complete

## 2024-07-14 NOTE — Discharge Summary (Signed)
 Physician Discharge Summary  UMI MAINOR FMW:[id number] DOB: 09/14/1951 DOA: 07/11/2024  PCP: Elnora Ip, MD  Admit date: 07/11/2024 Discharge date: 07/14/2024  Admitted From: Home Disposition: Home  Recommendations for Outpatient Follow-up:  Follow up with PCP in 1-2 weeks Follow-up with pulmonology as scheduled  Home Health: None Equipment/Devices: None  Discharge Condition: Stable CODE STATUS: Full Diet recommendation: Low-salt low-fat low-carb diet  Brief/Interim Summary: SHAWNA WEARING is a 73 y.o. female who presents to the hospital for acute respiratory failure for the fourth time in the past 2 months.  Patient has known medical history significant for COPD on room air, ongoing tobacco use disorder, HTN, HLD and depression.  Hospitalist called for admission given respiratory distress.  Patient admitted as above with acute recurrent hypoxic respiratory failure in setting of COPD likely exacerbated by medication noncompliance and ongoing tobacco abuse.  Lengthy discussion daily with patient in regards to need for medication compliance, tobacco cessation as well as close follow-up with PCP and pulmonology.  Patient's medications were adjusted as below, now on triple therapy with steroid taper.  Plan for pulmonology evaluation in the outpatient setting in the next few weeks to establish care and to hopefully improve patient's symptoms and reduce recurrent hospitalizations which we discussed with the patient was putting her at extremely high risk for decompensation.  Lengthy discussion daily in regards to patient's smoking cessation, Varenicline  prescribed at discharge.    Discharge Diagnoses:  Active Problems:   COPD exacerbation (HCC)  Acute hypoxic respiratory failure, POA Recurrent COPD exacerbation - Fourth admission in 2 months, pulmonology sidelined for outpatient follow-up scheduling - Patient appears to be poorly compliant with medications due to likely  cost versus availability - Initially on inhaled steroids, triple therapy as well as IV steroids -transitioning to p.o. steroids as above - Imaging and micro thus far negative(RSV, flu, COVID-negative) although certainly could be triggered by non-testable viral infection but less likely given the lack of symptoms -Continue incentive spirometer, flutter valve at home - Patient does not require oxygen , ambulating without any overt hypoxia or symptoms - Patient continues to smoke, lengthy discussion in regards to smoking cessation - Varencicline prescribed   Hypertensive urgency, resolved - Likely reactive secondary to above respiratory distress/air hunger - Exacerbated by medication noncompliance - Improving with supportive care and resumption of home meds (amlodipine , hydrochlorothiazide , losartan , metoprolol )   GERD - Continue Protonix  Seasonal allergies - Continue Flonase  and loratadine    Discharge Instructions  Discharge Instructions     Call MD for:  difficulty breathing, headache or visual disturbances   Complete by: As directed    Call MD for:  extreme fatigue   Complete by: As directed    Call MD for:  hives   Complete by: As directed    Call MD for:  persistant dizziness or light-headedness   Complete by: As directed    Call MD for:  persistant nausea and vomiting   Complete by: As directed    Call MD for:  severe uncontrolled pain   Complete by: As directed    Call MD for:  temperature >100.4   Complete by: As directed    Diet - low sodium heart healthy   Complete by: As directed    Discharge instructions   Complete by: As directed    Quit smoking as discussed, otherwise follow-up with PCP and pulmonology as scheduled.  If worsening symptoms or hypoxia noted at home please report back to the hospital immediately.   Increase activity slowly  Complete by: As directed       Allergies as of 07/14/2024       Reactions   Ace Inhibitors Anaphylaxis, Swelling, Other (See  Comments)   Throat swelling  (Re: Lisinopril -hydrochlorothiazide ) Pt questioned if she is allergic in 2025.        Medication List     STOP taking these medications    fluticasone -salmeterol 100-50 MCG/ACT Aepb Commonly known as: ADVAIR    loratadine  10 MG tablet Commonly known as: CLARITIN        TAKE these medications    acetaminophen  500 MG tablet Commonly known as: TYLENOL  Take 1,000 mg by mouth daily as needed for moderate pain (pain score 4-6) or mild pain (pain score 1-3).   albuterol  108 (90 Base) MCG/ACT inhaler Commonly known as: VENTOLIN  HFA Inhale 2 puffs into the lungs every 6 (six) hours as needed for wheezing or shortness of breath.   albuterol  (2.5 MG/3ML) 0.083% nebulizer solution Commonly known as: PROVENTIL  Take 3 mLs (2.5 mg total) by nebulization every 4 (four) hours as needed for wheezing or shortness of breath.   amLODipine  10 MG tablet Commonly known as: NORVASC  Take 1 tablet (10 mg total) by mouth daily.   Arnuity Ellipta  100 MCG/ACT Aepb Generic drug: Fluticasone  Furoate Inhale 1 puff into the lungs daily at 8 pm.   fluticasone  50 MCG/ACT nasal spray Commonly known as: FLONASE  Place 2 sprays into both nostrils daily.   losartan -hydrochlorothiazide  50-12.5 MG tablet Commonly known as: HYZAAR Take 1 tablet by mouth 2 (two) times daily.   metoprolol  tartrate 50 MG tablet Commonly known as: LOPRESSOR  Take 1 tablet (50 mg total) by mouth 2 (two) times daily.   pantoprazole  40 MG tablet Commonly known as: PROTONIX  Take 1 tablet (40 mg total) by mouth daily.   predniSONE  10 MG tablet Commonly known as: DELTASONE  Take 4 tablets (40 mg total) by mouth daily for 3 days, THEN 3 tablets (30 mg total) daily for 3 days, THEN 2 tablets (20 mg total) daily for 3 days, THEN 1 tablet (10 mg total) daily for 3 days. Start taking on: July 14, 2024   umeclidinium-vilanterol 62.5-25 MCG/ACT Aepb Commonly known as: ANORO ELLIPTA  Inhale 1 puff  into the lungs daily. Start taking on: July 15, 2024   Varenicline  Tartrate (Starter) 0.5 MG X 11 & 1 MG X 42 Tbpk Take 1 Package by mouth as directed.        Allergies  Allergen Reactions   Ace Inhibitors Anaphylaxis, Swelling and Other (See Comments)    Throat swelling  (Re: Lisinopril -hydrochlorothiazide )  Pt questioned if she is allergic in 2025.    Consultations: None  Procedures/Studies: DG Chest 2 View Result Date: 07/11/2024 CLINICAL DATA:  Shortness of breath and wheezing EXAM: CHEST - 2 VIEW COMPARISON:  Chest x-ray 06/20/2024. FINDINGS: The heart size and mediastinal contours are within normal limits. Both lungs are clear. Right axillary surgical clip present. Cervical spinal fusion plate again seen. No acute fractures are identified. IMPRESSION: No active cardiopulmonary disease. Electronically Signed   By: Greig Pique M.D.   On: 07/11/2024 18:53   DG Chest 2 View Result Date: 06/20/2024 CLINICAL DATA:  Shortness of breath. EXAM: CHEST - 2 VIEW COMPARISON:  May 24, 2024. FINDINGS: The heart size and mediastinal contours are within normal limits. Both lungs are clear. The visualized skeletal structures are unremarkable. IMPRESSION: No active cardiopulmonary disease. Electronically Signed   By: Lynwood Landy Raddle M.D.   On: 06/20/2024 09:05  Subjective: No acute issues or events overnight   Discharge Exam: Vitals:   07/14/24 0855 07/14/24 0905  BP:  (!) 109/94  Pulse:    Resp:    Temp:    SpO2: 100%    Vitals:   07/14/24 0514 07/14/24 0800 07/14/24 0855 07/14/24 0905  BP: (!) 109/94   (!) 109/94  Pulse: 80     Resp: 18     Temp: 99 F (37.2 C)     TempSrc: Oral     SpO2:  100% 100%     General: Pt is alert, awake, not in acute distress Cardiovascular: RRR, S1/S2 +, no rubs, no gallops Respiratory: CTA bilaterally, no wheezing, no rhonchi Abdominal: Soft, NT, ND, bowel sounds + Extremities: no edema, no cyanosis    The results of significant  diagnostics from this hospitalization (including imaging, microbiology, ancillary and laboratory) are listed below for reference.     Microbiology: Recent Results (from the past 240 hours)  Resp panel by RT-PCR (RSV, Flu A&B, Covid) Anterior Nasal Swab     Status: None   Collection Time: 07/11/24  8:12 PM   Specimen: Anterior Nasal Swab  Result Value Ref Range Status   SARS Coronavirus 2 by RT PCR NEGATIVE NEGATIVE Final    Comment: (NOTE) SARS-CoV-2 target nucleic acids are NOT DETECTED.  The SARS-CoV-2 RNA is generally detectable in upper respiratory specimens during the acute phase of infection. The lowest concentration of SARS-CoV-2 viral copies this assay can detect is 138 copies/mL. A negative result does not preclude SARS-Cov-2 infection and should not be used as the sole basis for treatment or other patient management decisions. A negative result may occur with  improper specimen collection/handling, submission of specimen other than nasopharyngeal swab, presence of viral mutation(s) within the areas targeted by this assay, and inadequate number of viral copies(<138 copies/mL). A negative result must be combined with clinical observations, patient history, and epidemiological information. The expected result is Negative.  Fact Sheet for Patients:  BloggerCourse.com  Fact Sheet for Healthcare Providers:  SeriousBroker.it  This test is no t yet approved or cleared by the [place]  FDA and  has been authorized for detection and/or diagnosis of SARS-CoV-2 by FDA under an Emergency Use Authorization (EUA). This EUA will remain  in effect (meaning this test can be used) for the duration of the COVID-19 declaration under Section 564(b)(1) of the Act, 21 U.S.C.section 360bbb-3(b)(1), unless the authorization is terminated  or revoked sooner.       Influenza A by PCR NEGATIVE NEGATIVE Final   Influenza B by PCR NEGATIVE  NEGATIVE Final    Comment: (NOTE) The Xpert Xpress SARS-CoV-2/FLU/RSV plus assay is intended as an aid in the diagnosis of influenza from Nasopharyngeal swab specimens and should not be used as a sole basis for treatment. Nasal washings and aspirates are unacceptable for Xpert Xpress SARS-CoV-2/FLU/RSV testing.  Fact Sheet for Patients: BloggerCourse.com  Fact Sheet for Healthcare Providers: SeriousBroker.it  This test is not yet approved or cleared by the [place]  FDA and has been authorized for detection and/or diagnosis of SARS-CoV-2 by FDA under an Emergency Use Authorization (EUA). This EUA will remain in effect (meaning this test can be used) for the duration of the COVID-19 declaration under Section 564(b)(1) of the Act, 21 U.S.C. section 360bbb-3(b)(1), unless the authorization is terminated or revoked.     Resp Syncytial Virus by PCR NEGATIVE NEGATIVE Final    Comment: (NOTE) Fact Sheet for Patients: BloggerCourse.com  Fact Sheet  for Healthcare Providers: SeriousBroker.it  This test is not yet approved or cleared by the [place]  FDA and has been authorized for detection and/or diagnosis of SARS-CoV-2 by FDA under an Emergency Use Authorization (EUA). This EUA will remain in effect (meaning this test can be used) for the duration of the COVID-19 declaration under Section 564(b)(1) of the Act, 21 U.S.C. section 360bbb-3(b)(1), unless the authorization is terminated or revoked.  Performed at [facility], [address] [place], [place] [address]      Labs: BNP (last 3 results) Recent Labs    02/17/24 1633 04/18/24 1809 06/20/24 0825  BNP 59.5 108.1* 161.8*   Basic Metabolic Panel: Recent Labs  Lab 07/11/24 1820 07/12/24 0526  NA 140 136  K 4.6 4.7  CL 105 100  CO2 23 21*  GLUCOSE 93 204*  BUN 10 15  CREATININE 0.70 0.73   CALCIUM  9.2 9.3   Liver Function Tests: No results for input(s): AST, ALT, ALKPHOS, BILITOT, PROT, ALBUMIN in the last 168 hours. No results for input(s): LIPASE, AMYLASE in the last 168 hours. No results for input(s): AMMONIA in the last 168 hours. CBC: Recent Labs  Lab 07/11/24 1820 07/12/24 0526  WBC 4.5 3.6*  HGB 11.3* 11.7*  HCT 37.4 36.5  MCV 90.6 90.1  PLT 253 269   Cardiac Enzymes: No results for input(s): CKTOTAL, CKMB, CKMBINDEX, TROPONINI in the last 168 hours. BNP: Invalid input(s): POCBNP CBG: No results for input(s): GLUCAP in the last 168 hours. D-Dimer No results for input(s): DDIMER in the last 72 hours. Hgb A1c No results for input(s): HGBA1C in the last 72 hours. Lipid Profile No results for input(s): CHOL, HDL, LDLCALC, TRIG, CHOLHDL, LDLDIRECT in the last 72 hours. Thyroid  function studies No results for input(s): TSH, T4TOTAL, T3FREE, THYROIDAB in the last 72 hours.  Invalid input(s): FREET3 Anemia work up No results for input(s): VITAMINB12, FOLATE, FERRITIN, TIBC, IRON, RETICCTPCT in the last 72 hours. Urinalysis    Component Value Date/Time   COLORURINE STRAW (A) 04/06/2019 1804   APPEARANCEUR CLEAR 04/06/2019 1804   LABSPEC 1.023 04/06/2019 1804   PHURINE 5.0 04/06/2019 1804   GLUCOSEU >=500 (A) 04/06/2019 1804   HGBUR NEGATIVE 04/06/2019 1804   BILIRUBINUR NEGATIVE 04/06/2019 1804   KETONESUR 5 (A) 04/06/2019 1804   PROTEINUR NEGATIVE 04/06/2019 1804   UROBILINOGEN 1.0 02/15/2012 1208   NITRITE NEGATIVE 04/06/2019 1804   LEUKOCYTESUR NEGATIVE 04/06/2019 1804   Sepsis Labs Recent Labs  Lab 07/11/24 1820 07/12/24 0526  WBC 4.5 3.6*   Microbiology Recent Results (from the past 240 hours)  Resp panel by RT-PCR (RSV, Flu A&B, Covid) Anterior Nasal Swab     Status: None   Collection Time: 07/11/24  8:12 PM   Specimen: Anterior Nasal Swab  Result Value Ref Range  Status   SARS Coronavirus 2 by RT PCR NEGATIVE NEGATIVE Final    Comment: (NOTE) SARS-CoV-2 target nucleic acids are NOT DETECTED.  The SARS-CoV-2 RNA is generally detectable in upper respiratory specimens during the acute phase of infection. The lowest concentration of SARS-CoV-2 viral copies this assay can detect is 138 copies/mL. A negative result does not preclude SARS-Cov-2 infection and should not be used as the sole basis for treatment or other patient management decisions. A negative result may occur with  improper specimen collection/handling, submission of specimen other than nasopharyngeal swab, presence of viral mutation(s) within the areas targeted by this assay, and inadequate number of viral copies(<138 copies/mL). A negative  result must be combined with clinical observations, patient history, and epidemiological information. The expected result is Negative.  Fact Sheet for Patients:  BloggerCourse.com  Fact Sheet for Healthcare Providers:  SeriousBroker.it  This test is no t yet approved or cleared by the [place]  FDA and  has been authorized for detection and/or diagnosis of SARS-CoV-2 by FDA under an Emergency Use Authorization (EUA). This EUA will remain  in effect (meaning this test can be used) for the duration of the COVID-19 declaration under Section 564(b)(1) of the Act, 21 U.S.C.section 360bbb-3(b)(1), unless the authorization is terminated  or revoked sooner.       Influenza A by PCR NEGATIVE NEGATIVE Final   Influenza B by PCR NEGATIVE NEGATIVE Final    Comment: (NOTE) The Xpert Xpress SARS-CoV-2/FLU/RSV plus assay is intended as an aid in the diagnosis of influenza from Nasopharyngeal swab specimens and should not be used as a sole basis for treatment. Nasal washings and aspirates are unacceptable for Xpert Xpress SARS-CoV-2/FLU/RSV testing.  Fact Sheet for  Patients: BloggerCourse.com  Fact Sheet for Healthcare Providers: SeriousBroker.it  This test is not yet approved or cleared by the [place]  FDA and has been authorized for detection and/or diagnosis of SARS-CoV-2 by FDA under an Emergency Use Authorization (EUA). This EUA will remain in effect (meaning this test can be used) for the duration of the COVID-19 declaration under Section 564(b)(1) of the Act, 21 U.S.C. section 360bbb-3(b)(1), unless the authorization is terminated or revoked.     Resp Syncytial Virus by PCR NEGATIVE NEGATIVE Final    Comment: (NOTE) Fact Sheet for Patients: BloggerCourse.com  Fact Sheet for Healthcare Providers: SeriousBroker.it  This test is not yet approved or cleared by the [place]  FDA and has been authorized for detection and/or diagnosis of SARS-CoV-2 by FDA under an Emergency Use Authorization (EUA). This EUA will remain in effect (meaning this test can be used) for the duration of the COVID-19 declaration under Section 564(b)(1) of the Act, 21 U.S.C. section 360bbb-3(b)(1), unless the authorization is terminated or revoked.  Performed at [facility], [address] [place], [place] [address]      Time coordinating discharge: Over 30 minutes  SIGNED:   Elsie JAYSON Montclair, DO [facility] 07/14/2024, 12:17 PM Pager   If 7PM-7AM, please contact night-coverage [email or web address]

## 2024-07-16 ENCOUNTER — Telehealth: Payer: Self-pay

## 2024-07-16 DIAGNOSIS — J441 Chronic obstructive pulmonary disease with (acute) exacerbation: Secondary | ICD-10-CM

## 2024-07-16 NOTE — Transitions of Care (Post Inpatient/ED Visit) (Signed)
 07/16/2024  Name: [name] MRN: [id number] DOB: [birth year]  Today's TOC FU Call Status: Today's TOC FU Call Status:: Successful TOC FU Call Completed TOC FU Call Complete Date: 07/16/24 Patient's Name and Date of Birth confirmed.  Transition Care Management Follow-up Telephone Call Date of Discharge: 07/14/24 Discharge Facility: [facility]) Type of Discharge: Inpatient Admission Primary Inpatient Discharge Diagnosis:: COPD Exacerbation How have you been since you were released from the hospital?: Same Any questions or concerns?: No  Items Reviewed: Did you receive and understand the discharge instructions provided?: Yes Medications obtained,verified, and reconciled?: Yes (Medications Reviewed) Any new allergies since your discharge?: No Dietary orders reviewed?: Yes Type of Diet Ordered:: Low Sodium Heart Healthy People in Home [RPT]: spouse Name of Support/Comfort Primary Source: [name]  Medications Reviewed Today: Medications Reviewed Today     Reviewed by [name], RN (Case Manager) on 07/16/24 at 1354  Med List Status: <None>   Medication Order Taking? Sig Documenting Provider Last Dose Status Informant  acetaminophen  (TYLENOL ) 500 MG tablet [id number]  Take 1,000 mg by mouth daily as needed for moderate pain (pain score 4-6) or mild pain (pain score 1-3). [provider]  Active Self, Pharmacy Records  albuterol  (PROVENTIL ) (2.5 MG/3ML) 0.083% nebulizer solution [id number]  Take 3 mLs (2.5 mg total) by nebulization every 4 (four) hours as needed for wheezing or shortness of breath. Gomez-Caraballo, Maria, MD  Active Self, Pharmacy Records  albuterol  (VENTOLIN  HFA) 108 [id number] Base) MCG/ACT inhaler [id number]  Inhale 2 puffs into the lungs every 6 (six) hours as needed for wheezing or shortness of breath. Jolaine Pac, DO  Active Self, Pharmacy Records  amLODipine  (NORVASC ) 10 MG tablet [id number]  Take 1 tablet (10 mg total) by mouth daily.  Fernand Prost, MD  Active Self, Pharmacy Records  fluticasone  (FLONASE ) 50 MCG/ACT nasal spray [id number]  Place 2 sprays into both nostrils daily.  Patient not taking: Reported on 07/12/2024   Sebastian Toribio GAILS, MD  Active Self, Pharmacy Records  Fluticasone  Furoate (ARNUITY ELLIPTA ) 100 MCG/ACT AEPB [id number]  Inhale 1 puff into the lungs daily at 8 pm. Lue Elsie BROCKS, MD  Active   losartan -hydrochlorothiazide  (HYZAAR) 50-12.5 MG tablet [id number]  Take 1 tablet by mouth 2 (two) times daily. Elnora Ip, MD  Active Self, Pharmacy Records           Med Note (CRUTHIS, CHLOE C   Wed Jun 20, 2024 11:52 AM)    metoprolol  tartrate (LOPRESSOR ) 50 MG tablet [id number]  Take 1 tablet (50 mg total) by mouth 2 (two) times daily. Vann, Jessica U, DO  Active Self, Pharmacy Records  pantoprazole  (PROTONIX ) 40 MG tablet [id number]  Take 1 tablet (40 mg total) by mouth daily.  Patient not taking: Reported on 07/12/2024   Sebastian Toribio GAILS, MD  Active Self, Pharmacy Records  predniSONE  (DELTASONE ) 10 MG tablet [id number]  Take 4 tablets (40 mg total) by mouth daily for 3 days, THEN 3 tablets (30 mg total) daily for 3 days, THEN 2 tablets (20 mg total) daily for 3 days, THEN 1 tablet (10 mg total) daily for 3 days. Lue Elsie BROCKS, MD  Active   umeclidinium-vilanterol (ANORO ELLIPTA ) 62.5-25 MCG/ACT AEPB [id number]  Inhale 1 puff into the lungs daily. Lue Elsie BROCKS, MD  Active   Varenicline  Tartrate, Starter, 0.5 MG X 11 & 1 MG X 42 TBPK [id number]  Take 1 Package by mouth as directed.  Patient not taking: Reported on 07/16/2024   Lue,  Elsie BROCKS, MD  Active             Home Care and Equipment/Supplies: Were Home Health Services Ordered?: No Any new equipment or medical supplies ordered?: No  Functional Questionnaire: Do you need assistance with bathing/showering or dressing?: No Do you need assistance with meal preparation?: No Do you need assistance with eating?: No Do you  have difficulty maintaining continence: No Do you need assistance with getting out of bed/getting out of a chair/moving?: No Do you have difficulty managing or taking your medications?: No  Follow up appointments reviewed: PCP Follow-up appointment confirmed?: No (Sent a message to the providers office) MD Provider Line Number:[phone] Given: No Specialist Hospital Follow-up appointment confirmed?: Yes Date of Specialist follow-up appointment?: 07/23/24 Follow-Up Specialty Provider:: Zola Herter Do you need transportation to your follow-up appointment?: No Do you understand care options if your condition(s) worsen?: Yes-patient verbalized understanding  SDOH Interventions Today    Flowsheet Row Most Recent Value  SDOH Interventions   Food Insecurity Interventions AMB Referral  Housing Interventions Intervention Not Indicated  Transportation Interventions Intervention Not Indicated  Utilities Interventions Intervention Not Indicated  Depression Interventions/Treatment  Counseling    Goals Addressed             This Visit's Progress    VBCI Transitions of Care (TOC) Care Plan       Problems: Recent Hospitalization for treatment of COPD exacerbation related to allergies Medication access barrier The patient is on disability and states she cannot always afford her medication and Medication management barrier : The patient has a history of non-compliance Pharmacy referral has been placed on last admission but the patient did not answer the phone or call the pharmacist back.  PCP appointment scheduled for 07/03/24 - confirmed with the patient - The patient did not attend the appointment 07/16/24 [facility] or ED Adm Risk 59 % - Four admissions in the last 2 months  Goal:  Over the next 30 days, the patient will not experience hospital readmission  Interventions:   COPD Interventions:  Advised patient to track and manage COPD triggers Assessed social determinant of health  barriers Discussed the importance of adequate rest and management of fatigue with COPD Provided instruction about proper use of medications used for management of COPD including inhalers Referral made to community resources care guide team for assistance with Pharmacy referral: Completed 04/25/24 - The pharmacy team has not connected with the patient. They have left messages and the patient has not returned calls.   Screening for signs and symptoms of depression related to chronic disease state  Educate the patient on the medications such as Claritin , Flonase , Prednisone  taper, nebulizer   and inhalers that aide in COPD triggers Smoking Cessation. The patient declines Nicotine  patches and will try the Nicotine  gum. She is trying to cut back on the number of cigarettes she smokes daily. She states the nicotine  patches make her feel nauseated. 07/16/24 - Update to respiratory medications - Anoro Ellipta  (umeclidinium-vilanterol) Start taking on: July 15, 2024 Arnuity Ellipta  (Fluticasone  Furoate) predniSONE  (DELTASONE ) Start taking on: July 14, 2024  Patient Self Care Activities:  Attend all scheduled provider appointments Call pharmacy for medication refills 3-7 days in advance of running out of medications Call provider office for new concerns or questions  Notify RN Care Manager of TOC call rescheduling needs Participate in Transition of Care Program/Attend TOC scheduled calls Perform all self care activities independently  Take medications as prescribed     Plan:  The patient states she will be in [facility] the fist week of September and will not be available for an [employer] call. Next [facility] Outreach scheduled for Wednesday September 10th at 1:00pm - Hospitalized  Follow up phone call at the end of the week Friday September 12th at 12:00pm        Medford Balboa, BSN, RN [place]  VBCI - [facility] RN Care Manager [phone]

## 2024-07-16 NOTE — Patient Instructions (Signed)
 Visit Information  Thank you for taking time to visit with me today. Please don't hesitate to contact me if I can be of assistance to you before our next scheduled telephone appointment.  Our next appointment is by telephone on Friday September 12th at 12noon  Following is a copy of your care plan:   Goals Addressed             This Visit's Progress    VBCI Transitions of Care (TOC) Care Plan       Problems: Recent Hospitalization for treatment of COPD exacerbation related to allergies Medication access barrier The patient is on disability and states she cannot always afford her medication and Medication management barrier : The patient has a history of non-compliance Pharmacy referral has been placed on last admission but the patient did not answer the phone or call the pharmacist back.  PCP appointment scheduled for 07/03/24 - confirmed with the patient - The patient did not attend the appointment 07/16/24 [facility] or ED Adm Risk 59 % - Four admissions in the last 2 months  Goal:  Over the next 30 days, the patient will not experience hospital readmission  Interventions:   COPD Interventions:  Advised patient to track and manage COPD triggers Assessed social determinant of health barriers Discussed the importance of adequate rest and management of fatigue with COPD Provided instruction about proper use of medications used for management of COPD including inhalers Referral made to community resources care guide team for assistance with Pharmacy referral: Completed 04/25/24 - The pharmacy team has not connected with the patient. They have left messages and the patient has not returned calls.   Screening for signs and symptoms of depression related to chronic disease state  Educate the patient on the medications such as Claritin , Flonase , Prednisone  taper, nebulizer   and inhalers that aide in COPD triggers Smoking Cessation. The patient declines Nicotine  patches and will try the  Nicotine  gum. She is trying to cut back on the number of cigarettes she smokes daily. She states the nicotine  patches make her feel nauseated. 07/16/24 - Update to respiratory medications - Anoro Ellipta  (umeclidinium-vilanterol) Start taking on: July 15, 2024 Arnuity Ellipta  (Fluticasone  Furoate) predniSONE  (DELTASONE ) Start taking on: July 14, 2024  Patient Self Care Activities:  Attend all scheduled provider appointments Call pharmacy for medication refills 3-7 days in advance of running out of medications Call provider office for new concerns or questions  Notify RN Care Manager of [facility] call rescheduling needs Participate in Transition of Care Program/Attend [facility] scheduled calls Perform all self care activities independently  Take medications as prescribed     Plan:  The patient states she will be in [facility] the fist week of September and will not be available for an [employer] call. Next [facility] Outreach scheduled for Wednesday September 10th at 1:00pm - Hospitalized  Follow up phone call at the end of the week Friday September 12th at 12:00pm        Patient verbalizes understanding of instructions and care plan provided today and agrees to view in MyChart. Active MyChart status and patient understanding of how to access instructions and care plan via MyChart confirmed with patient.     The patient has been provided with contact information for the care management team and has been advised to call with any health related questions or concerns.   Please call the care guide team at [phone] if you need to cancel or reschedule your appointment.  Please call the Suicide and Crisis Lifeline: 988 call the [place]  National Suicide Prevention Lifeline: 931-503-5963 or TTY: 903-188-8620 TTY 318-659-0346) to talk to a trained counselor if you are experiencing a Mental Health or Behavioral Health Crisis or need someone to talk to.  Medford Balboa, BSN, RN [place]  VBCI  - [employer] [facility] RN Care Manager [phone]

## 2024-07-17 ENCOUNTER — Telehealth: Payer: Self-pay

## 2024-07-17 NOTE — Progress Notes (Signed)
 Care Guide Pharmacy Note  07/17/2024 Name: [name] MRN: [id number] DOB: 1951-10-26  Referred By: Elnora Ip, MD Reason for referral: Complex Care Management (Outreach to schedule with Pharm d )   [name] is a 73 y.o. year old female who is a primary care patient of Elnora Ip, MD.  [name] Lady was referred to the pharmacist for assistance related to: DMII  Successful contact was made with the patient to discuss pharmacy services including being ready for the pharmacist to call at least 5 minutes before the scheduled appointment time and to have medication bottles and any blood pressure readings ready for review. The patient agreed to meet with the pharmacist via telephone visit on (date/time).07/19/2024  Jeoffrey Buffalo , RMA     [place]  [facility], [facility] Guide  Direct Dial: [phone]  Website: [email or web address]

## 2024-07-18 ENCOUNTER — Telehealth

## 2024-07-18 NOTE — Patient Outreach (Signed)
 BSW attempted to reach patient over the phone today without success. BSW left VM explaining purpose of call and requesting call back to schedule initial outreach. BSW will continue outreach efforts tomorrow.

## 2024-07-19 ENCOUNTER — Other Ambulatory Visit: Payer: Self-pay

## 2024-07-19 DIAGNOSIS — I10 Essential (primary) hypertension: Secondary | ICD-10-CM

## 2024-07-19 DIAGNOSIS — J441 Chronic obstructive pulmonary disease with (acute) exacerbation: Secondary | ICD-10-CM

## 2024-07-19 NOTE — Patient Outreach (Signed)
 Complex Care Management   Visit Note  07/19/2024  Name:  [name] MRN: [id number] DOB: 05-Dec-1950  Situation: Referral received for Complex Care Management related to SDOH Barriers:  Food insecurity I obtained verbal consent from Patient.  Visit completed with Patient  on the phone  Background:   Past Medical History:  Diagnosis Date   Acute sinusitis 01/09/2019   Acute sinusitis 01/09/2019   Anxiety    Arthritis    fingers, knees (08/16/2018)   Asthma    Atopic dermatitis 03/20/2018   Axillary hidradenitis suppurativa 10/13/2018   Cancer of right breast (HCC) 1991   s/p lumpectomy, chemotherapy and radiation therapy in 1991. Mammogram in 2007 was normal.   Cellulitis of buttock, left 12/13/2022   Constipated    h/o   COPD (chronic obstructive pulmonary disease) (HCC)    History of multiple hospital admissions for exercabation    COPD exacerbation (HCC) 01/01/2019   COPD exacerbation (HCC) 02/17/2024   COPD with acute exacerbation (HCC) 01/14/2019   COPD with exacerbation (HCC) 04/06/2009   Qualifier: Diagnosis of  By: Loletta MD, Vijay     Depression    Diarrhea    h/o   Facial cellulitis 08/30/2022   Facial edema 08/31/2022   GERD (gastroesophageal reflux disease)    Grade I diastolic dysfunction 01/12/2024   Headache    a few times/month (08/16/2018   Heart murmur 10/05/2011   first time I ever heard I had one was today   History of breast cancer 12/01/2012   Pt with h/o breast CA s/p lumpectomy with chemo/radiation in 1991. Pt mammogram 2011 was unremarkable. Had CT chest 10/2012 in ED for SOB and showed spiculated nodule with lymph node. 12/04/12: Birads 2; repeat diagnostic mammogram in 1 year.     Hyperlipidemia    Hypertension    Lower extremity edema 09/21/2018   Obesity    Personal history of chemotherapy    Personal history of radiation therapy    Pneumonia    couple times in the last 10-15 yrs (08/16/2018)   QT prolongation 08/08/2014    Seasonal allergies 02/25/2017   Shortness of breath 10/05/2011   at rest; lying down; w/exertion   Sigmoid diverticulitis 80/2008   Tobacco abuse    Type 2 diabetes mellitus (HCC) 05/14/2009   Type II diabetes mellitus (HCC)     Assessment: BSW held initial outreach appt with Pt. Pt was alert and cognitive. SDOH needs were assessed and the following needs were identified: Food insecurity. Pt reports somewhat of a food insecurity but states they are not in dire need of food. Pt lives in home with husband and two pets. Pt reports she gets FNS benefit, but is not enough. Pt was provided over the phone 5 places in the upcoming days where she can receive up to 50lbs of food through out of the garden food project for immediate food access. BSW will be sending via mail food pantry information and the distribution schedule for the month. Pt confirmed no other need for resources and plans to access food through the help of her husband or family. No other resources were provided/requested. Pt was given contact # for BSW for future reference.   SDOH Interventions    Flowsheet Row Patient Outreach Telephone from 07/19/2024 in [place] [facility]ULATION [facility] DEPARTMENT Telephone from 07/16/2024 in [place] POPULATION HEALTH DEPARTMENT Telephone from 06/27/2024 in [place] POPULATION HEALTH DEPARTMENT Patient Outreach Telephone from 06/12/2024 in [place] POPULATION HEALTH DEPARTMENT Patient Outreach  Telephone from 05/28/2024 in [place] POPULATION HEALTH DEPARTMENT ED to Hosp-Admission (Discharged) from 05/13/2024 in [place] [facility]  SDOH Interventions        Food Insecurity Interventions Community Resources Provided  [patient was provided with out-of-the garden food project distribution schedule.] AMB Referral Intervention Not Indicated -- Intervention Not Indicated Intervention Not Indicated, Inpatient TOC  Housing Interventions Intervention Not Indicated Intervention  Not Indicated Intervention Not Indicated -- Intervention Not Indicated Intervention Not Indicated, Inpatient TOC  Transportation Interventions Intervention Not Indicated, Patient Resources (Friends/Family)  Dentist and brothers sometimes provide transportation if husband cannot provide transportation.] Intervention Not Indicated Intervention Not Indicated -- Intervention Not Indicated Intervention Not Indicated, Inpatient TOC  Utilities Interventions Intervention Not Indicated Intervention Not Indicated Intervention Not Indicated -- Intervention Not Indicated Intervention Not Indicated, Inpatient TOC  Depression Interventions/Treatment  -- Counseling -- Counseling Counseling --  Financial Strain Interventions Intervention Not Indicated -- -- -- -- --  Physical Activity Interventions -- -- -- -- Other (Comments) --  Stress Interventions -- -- -- Provide Counseling -- --      Recommendation:   Access mobile food markets through out of the garden food project.  Follow Up Plan:   Telephone follow up appointment date/time:  08/02/2024 at 3pm  Laymon Doll, [place]/VBCI - [facility] Social Worker [phone]

## 2024-07-19 NOTE — Progress Notes (Signed)
 Attempted to contact patient for scheduled appointment for medication management. Left HIPAA compliant message for patient regarding caller and purpose of visit.

## 2024-07-19 NOTE — Patient Instructions (Signed)
 Visit Information  Thank you for taking time to visit with me today. Please don't hesitate to contact me if I can be of assistance to you before our next scheduled appointment.  Our next appointment is by telephone on 08/02/2024 at 3pm Please call the care guide team at [phone] if you need to cancel or reschedule your appointment.   Following is a copy of your care plan:   Goals Addressed             This Visit's Progress    BSW VBCI Social Work Care Plan   On track    Problems:   Food Insecurity   CSW Clinical Goal(s):   Over the next 2 weeks the Patient will work with Child psychotherapist to address concerns related to food insecurity.  Interventions:  07/19/2024   BSW will provide patient resources for food and pantry information via mail for food insecurity.   Patient Goals/Self-Care Activities:  Access food resources provided to her over the phone.   Plan:   Telephone follow up appointment with care management team member scheduled for:  08/02/2024 at 3pm        Please call the Suicide and Crisis Lifeline: 988 go to [facility] Urgent [facility] [address] [place] [phone]) call 911 if you are experiencing a Mental Health or Behavioral Health Crisis or need someone to talk to.  Patient verbalizes understanding of instructions and care plan provided today and agrees to view in MyChart. Active MyChart status and patient understanding of how to access instructions and care plan via MyChart confirmed with patient.     Laymon Doll, BSW [place]/VBCI - [employer] Social Worker [phone]

## 2024-07-20 ENCOUNTER — Other Ambulatory Visit: Payer: Self-pay

## 2024-07-20 NOTE — Transitions of Care (Post Inpatient/ED Visit) (Signed)
 Transition of Care week 1 follow up   Visit Note  07/20/2024  Name: [name] MRN: [id number]          DOB: 1950/11/17  Situation: Patient enrolled in [facility] 30-day program. Visit completed with Barnie Lady by telephone.   Background:   Past Medical History:  Diagnosis Date   Acute sinusitis 01/09/2019   Acute sinusitis 01/09/2019   Anxiety    Arthritis    fingers, knees (08/16/2018)   Asthma    Atopic dermatitis 03/20/2018   Axillary hidradenitis suppurativa 10/13/2018   Cancer of right breast (HCC) 1991   s/p lumpectomy, chemotherapy and radiation therapy in 1991. Mammogram in 2007 was normal.   Cellulitis of buttock, left 12/13/2022   Constipated    h/o   COPD (chronic obstructive pulmonary disease) (HCC)    History of multiple hospital admissions for exercabation    COPD exacerbation (HCC) 01/01/2019   COPD exacerbation (HCC) 02/17/2024   COPD with acute exacerbation (HCC) 01/14/2019   COPD with exacerbation (HCC) 04/06/2009   Qualifier: Diagnosis of  By: Loletta MD, Vijay     Depression    Diarrhea    h/o   Facial cellulitis 08/30/2022   Facial edema 08/31/2022   GERD (gastroesophageal reflux disease)    Grade I diastolic dysfunction 01/12/2024   Headache    a few times/month (08/16/2018   Heart murmur 10/05/2011   first time I ever heard I had one was today   History of breast cancer 12/01/2012   Pt with h/o breast CA s/p lumpectomy with chemo/radiation in 1991. Pt mammogram 2011 was unremarkable. Had CT chest 10/2012 in ED for SOB and showed spiculated nodule with lymph node. 12/04/12: Birads 2; repeat diagnostic mammogram in 1 year.     Hyperlipidemia    Hypertension    Lower extremity edema 09/21/2018   Obesity    Personal history of chemotherapy    Personal history of radiation therapy    Pneumonia    couple times in the last 10-15 yrs (08/16/2018)   QT prolongation 08/08/2014   Seasonal allergies 02/25/2017   Shortness of breath  10/05/2011   at rest; lying down; w/exertion   Sigmoid diverticulitis 80/2008   Tobacco abuse    Type 2 diabetes mellitus (HCC) 05/14/2009   Type II diabetes mellitus (HCC)     Assessment: Patient Reported Symptoms: Cognitive Cognitive Status: Alert and oriented to person, place, and time      Neurological Neurological Review of Symptoms: Not assessed    HEENT HEENT Symptoms Reported: Not assessed      Cardiovascular Cardiovascular Symptoms Reported: Not assessed    Respiratory Respiratory Symptoms Reported: Not assesed Additional Respiratory Details: The patient states she was asleep and she has a lot going on right now. She may not make her Pulmonary appointment on Monday 07/23/24. She states her grandson is having surgery and she is having some family issues. Discussed with her that she has her own health issues to be concerned about and should try to keep the appointment.    Endocrine Endocrine Symptoms Reported: Not assessed    Gastrointestinal Gastrointestinal Symptoms Reported: Not assessed      Genitourinary Genitourinary Symptoms Reported: Not assessed    Integumentary Integumentary Symptoms Reported: Not assessed    Musculoskeletal Musculoskelatal Symptoms Reviewed: Weakness, Limited mobility Musculoskeletal Management Strategies: Adequate rest, Medical device, Activity Musculoskeletal Comment: The patient states she is tired and she is sleeping and not wanting to talk.  Psychosocial Psychosocial Symptoms Reported: Not assessed         There were no vitals filed for this visit.  Medications Reviewed Today     Reviewed by [name], RN (Case Manager) on 07/20/24 at 1210  Med List Status: <None>   Medication Order Taking? Sig Documenting Provider Last Dose Status Informant  acetaminophen  (TYLENOL ) 500 MG tablet [id number] No Take 1,000 mg by mouth daily as needed for moderate pain (pain score 4-6) or mild pain (pain score 1-3). [provider] Past Week Active Self, Pharmacy Records  albuterol  (PROVENTIL ) (2.5 MG/3ML) 0.083% nebulizer solution [id number] No Take 3 mLs (2.5 mg total) by nebulization every 4 (four) hours as needed for wheezing or shortness of breath. Gomez-Caraballo, Maria, MD 07/11/2024 Active Self, Pharmacy Records  albuterol  (VENTOLIN  HFA) 108 [phone] Base) MCG/ACT inhaler [id number] No Inhale 2 puffs into the lungs every 6 (six) hours as needed for wheezing or shortness of breath. Jolaine Pac, DO 07/11/2024 Active Self, Pharmacy Records  amLODipine  (NORVASC ) 10 MG tablet [id number] No Take 1 tablet (10 mg total) by mouth daily. Fernand Prost, MD Past Week Active Self, Pharmacy Records  fluticasone  (FLONASE ) 50 MCG/ACT nasal spray [id number]  Place 2 sprays into both nostrils daily. Sebastian Toribio GAILS, MD  Active Self, Pharmacy Records  Fluticasone  Furoate (ARNUITY ELLIPTA ) 100 MCG/ACT AEPB [id number]  Inhale 1 puff into the lungs daily at 8 pm. Lue Elsie BROCKS, MD  Active   losartan -hydrochlorothiazide  (HYZAAR) 50-12.5 MG tablet [id number] No Take 1 tablet by mouth 2 (two) times daily. Gomez-Caraballo, Maria, MD Past Week Active Self, Pharmacy Records           Med Note (CRUTHIS, CHLOE C   Wed Jun 20, 2024 11:52 AM)    metoprolol  tartrate (LOPRESSOR ) 50 MG tablet [id number] No Take 1 tablet (50 mg total) by mouth 2 (two) times daily. Vann, Jessica U, DO Past Week Active Self, Pharmacy Records  pantoprazole  (PROTONIX ) 40 MG tablet [id number] No Take 1 tablet (40 mg total) by mouth daily.  Patient not taking: Reported on 07/12/2024   Sebastian Toribio GAILS, MD Not Taking Active Self, Pharmacy Records  predniSONE  (DELTASONE ) 10 MG tablet [id number]  Take 4 tablets (40 mg total) by mouth daily for 3 days, THEN 3 tablets (30 mg total) daily for 3 days, THEN 2 tablets (20 mg total) daily for 3 days, THEN 1 tablet (10 mg total) daily for 3 days. Lue Elsie BROCKS, MD  Active   umeclidinium-vilanterol (ANORO ELLIPTA ) 62.5-25  MCG/ACT AEPB [id number]  Inhale 1 puff into the lungs daily. Lue Elsie BROCKS, MD  Active   Varenicline  Tartrate, Starter, 0.5 MG X 11 & 1 MG X 42 TBPK [id number]  Take 1 Package by mouth as directed.  Patient not taking: Reported on 07/16/2024   Lue Elsie BROCKS, MD  Active             Recommendation:   PCP Follow-up Continue Current Plan of Care  Follow Up Plan:   Telephone follow-up in 1 week  Medford Moises, BSN, RN [place]  VBCI - [facility] RN Care Manager [phone]

## 2024-07-20 NOTE — Patient Instructions (Signed)
 Visit Information  Thank you for taking time to visit with me today. Please don't hesitate to contact me if I can be of assistance to you before our next scheduled telephone appointment.  Our next appointment is by telephone on Wednesday September 17th at 2:00pm  Following is a copy of your care plan:   Goals Addressed   None     Patient verbalizes understanding of instructions and care plan provided today and agrees to view in MyChart. Active MyChart status and patient understanding of how to access instructions and care plan via MyChart confirmed with patient.     The patient has been provided with contact information for the care management team and has been advised to call with any health related questions or concerns.   Please call the care guide team at [phone] if you need to cancel or reschedule your appointment.   Please call the Suicide and Crisis Lifeline: 988 call the [place]  National Suicide Prevention Lifeline: 365-698-0666 or TTY: 346-085-8738 TTY 838-752-2077) to talk to a trained counselor if you are experiencing a Mental Health or Behavioral Health Crisis or need someone to talk to.  Medford Balboa, BSN, RN [place]  VBCI - [facility] RN Care Manager [phone]

## 2024-07-23 ENCOUNTER — Ambulatory Visit

## 2024-07-25 ENCOUNTER — Other Ambulatory Visit: Payer: Self-pay

## 2024-07-25 NOTE — Patient Instructions (Signed)
 Visit Information  Thank you for taking time to visit with me today. Please don't hesitate to contact me if I can be of assistance to you before our next scheduled telephone appointment.  Our next appointment is by telephone on September 24th at [facility]  Following is a copy of your care plan:   Goals Addressed             This Visit's Progress    VBCI Transitions of Care (TOC) Care Plan       Problems: ( reviewed 07/25/24) Recent Hospitalization for treatment of COPD exacerbation related to allergies Medication access barrier The patient is on disability and states she cannot always afford her medication and Medication management barrier : The patient has a history of non-compliance Pharmacy referral has been placed on last admission but the patient did not answer the phone or call the pharmacist back.  PCP appointment scheduled for 07/03/24 - confirmed with the patient - The patient did not attend the appointment 07/16/24 [facility] or ED Adm Risk 59 % - Four admissions in the last 2 months 07/25/24 - The patient did a No Show for the Pulmonologist 07/23/24  Goal:  Over the next 30 days, the patient will not experience hospital readmission  Interventions: ( reviewed 07/25/24)  COPD Interventions:  Advised patient to track and manage COPD triggers Assessed social determinant of health barriers Discussed the importance of adequate rest and management of fatigue with COPD Provided instruction about proper use of medications used for management of COPD including inhalers Referral made to community resources care guide team for assistance with Pharmacy referral: Completed 04/25/24 - The pharmacy team has not connected with the patient. They have left messages and the patient has not returned calls.   Screening for signs and symptoms of depression related to chronic disease state  Educate the patient on the medications such as Claritin , Flonase , Prednisone  taper, nebulizer   and inhalers that aide  in COPD triggers Smoking Cessation. The patient declines Nicotine  patches and will try the Nicotine  gum. She is trying to cut back on the number of cigarettes she smokes daily. She states the nicotine  patches make her feel nauseated. 07/16/24 - Update to respiratory medications - Anoro Ellipta  (umeclidinium-vilanterol) Start taking on: July 15, 2024 Arnuity Ellipta  (Fluticasone  Furoate) predniSONE  (DELTASONE ) 07/25/24 - The patient is smoking about 10 cigarettes a day  Patient Self Care Activities: ( reviewed 07/25/24) Attend all scheduled provider appointments Call pharmacy for medication refills 3-7 days in advance of running out of medications Call provider office for new concerns or questions  Notify RN Care Manager of [facility] call rescheduling needs Participate in Transition of Care Program/Attend [facility] scheduled calls Perform all self care activities independently  Take medications as prescribed     Plan:  The patient states she will be in [facility] the fist week of September and will not be available for an [employer] call. Next [facility] Outreach scheduled for Wednesday September 10th at 1:00pm - Hospitalized  Follow up phone call at the end of the week Wednesday September 24th at 3:00pm        Patient verbalizes understanding of instructions and care plan provided today and agrees to view in MyChart. Active MyChart status and patient understanding of how to access instructions and care plan via MyChart confirmed with patient.     The patient has been provided with contact information for the care management team and has been advised to call with any health related questions  or concerns.   Please call the care guide team at [phone] if you need to cancel or reschedule your appointment.   Please call the Suicide and Crisis Lifeline: 988 call the [place]  National Suicide Prevention Lifeline: (984)657-7966 or TTY: 213 806 5404 TTY (303) 492-1422) to talk to a trained counselor if  you are experiencing a Mental Health or Behavioral Health Crisis or need someone to talk to.  Medford Balboa, BSN, RN [place]  VBCI - [facility] RN Care Manager [phone]

## 2024-07-25 NOTE — Transitions of Care (Post Inpatient/ED Visit) (Signed)
 Transition of Care week 2  Visit Note  07/25/2024  Name: [name] MRN: [id number]          DOB: November 16, 1950  Situation: Patient enrolled in [facility] 30-day program. Visit completed with Barnie Lady by telephone.   Background:   Past Medical History:  Diagnosis Date   Acute sinusitis 01/09/2019   Acute sinusitis 01/09/2019   Anxiety    Arthritis    fingers, knees (08/16/2018)   Asthma    Atopic dermatitis 03/20/2018   Axillary hidradenitis suppurativa 10/13/2018   Cancer of right breast (HCC) 1991   s/p lumpectomy, chemotherapy and radiation therapy in 1991. Mammogram in 2007 was normal.   Cellulitis of buttock, left 12/13/2022   Constipated    h/o   COPD (chronic obstructive pulmonary disease) (HCC)    History of multiple hospital admissions for exercabation    COPD exacerbation (HCC) 01/01/2019   COPD exacerbation (HCC) 02/17/2024   COPD with acute exacerbation (HCC) 01/14/2019   COPD with exacerbation (HCC) 04/06/2009   Qualifier: Diagnosis of  By: Loletta MD, Vijay     Depression    Diarrhea    h/o   Facial cellulitis 08/30/2022   Facial edema 08/31/2022   GERD (gastroesophageal reflux disease)    Grade I diastolic dysfunction 01/12/2024   Headache    a few times/month (08/16/2018   Heart murmur 10/05/2011   first time I ever heard I had one was today   History of breast cancer 12/01/2012   Pt with h/o breast CA s/p lumpectomy with chemo/radiation in 1991. Pt mammogram 2011 was unremarkable. Had CT chest 10/2012 in ED for SOB and showed spiculated nodule with lymph node. 12/04/12: Birads 2; repeat diagnostic mammogram in 1 year.     Hyperlipidemia    Hypertension    Lower extremity edema 09/21/2018   Obesity    Personal history of chemotherapy    Personal history of radiation therapy    Pneumonia    couple times in the last 10-15 yrs (08/16/2018)   QT prolongation 08/08/2014   Seasonal allergies 02/25/2017   Shortness of breath 10/05/2011    at rest; lying down; w/exertion   Sigmoid diverticulitis 80/2008   Tobacco abuse    Type 2 diabetes mellitus (HCC) 05/14/2009   Type II diabetes mellitus (HCC)     Assessment: Patient Reported Symptoms: Cognitive Cognitive Status: Alert and oriented to person, place, and time, Struggling with memory recall      Neurological Neurological Review of Symptoms: No symptoms reported    HEENT HEENT Symptoms Reported: No symptoms reported      Cardiovascular Cardiovascular Symptoms Reported: No symptoms reported Cardiovascular Management Strategies: Medication therapy, Routine screening, Adequate rest  Respiratory Respiratory Symptoms Reported: Productive cough Additional Respiratory Details: The patient states she feels better this week with less nasal congestion. She has a wet cough that is productive at times. She is finishing her Prednisone  taper this week. She did go to the Pulmonologist on 07/23/24 due to family matters but she has not rescheduled it. She continues to smoke and states she is trying to cut down. She is smoking about 10 cigarettes a day. Respiratory Management Strategies: Medication therapy, Adequate rest, Routine screening, Coping strategies, Activity  Endocrine Endocrine Symptoms Reported: No symptoms reported    Gastrointestinal Gastrointestinal Symptoms Reported: No symptoms reported      Genitourinary Genitourinary Symptoms Reported: No symptoms reported    Integumentary Integumentary Symptoms Reported: No symptoms reported    Musculoskeletal Musculoskelatal Symptoms Reviewed:  Weakness, Limited mobility Additional Musculoskeletal Details: The patient is mostly sedentary Musculoskeletal Management Strategies: Adequate rest, Medical device, Activity      Psychosocial Psychosocial Symptoms Reported: No symptoms reported         There were no vitals filed for this visit.  Medications Reviewed Today     Reviewed by [name], RN (Case Manager) on  07/25/24 at 1511  Med List Status: <None>   Medication Order Taking? Sig Documenting Provider Last Dose Status Informant  acetaminophen  (TYLENOL ) 500 MG tablet [id number] No Take 1,000 mg by mouth daily as needed for moderate pain (pain score 4-6) or mild pain (pain score 1-3). [provider] Past Week Active Self, Pharmacy Records  albuterol  (PROVENTIL ) (2.5 MG/3ML) 0.083% nebulizer solution [id number] No Take 3 mLs (2.5 mg total) by nebulization every 4 (four) hours as needed for wheezing or shortness of breath. Gomez-Caraballo, Maria, MD 07/11/2024 Active Self, Pharmacy Records  albuterol  (VENTOLIN  HFA) 108 [phone] Base) MCG/ACT inhaler [id number] No Inhale 2 puffs into the lungs every 6 (six) hours as needed for wheezing or shortness of breath. Jolaine Pac, DO 07/11/2024 Active Self, Pharmacy Records  amLODipine  (NORVASC ) 10 MG tablet [id number] No Take 1 tablet (10 mg total) by mouth daily. Fernand Prost, MD Past Week Active Self, Pharmacy Records  fluticasone  (FLONASE ) 50 MCG/ACT nasal spray [id number]  Place 2 sprays into both nostrils daily. Sebastian Toribio GAILS, MD  Active Self, Pharmacy Records  Fluticasone  Furoate (ARNUITY ELLIPTA ) 100 MCG/ACT AEPB [id number]  Inhale 1 puff into the lungs daily at 8 pm. Lue Elsie BROCKS, MD  Active   losartan -hydrochlorothiazide  (HYZAAR) 50-12.5 MG tablet [id number] No Take 1 tablet by mouth 2 (two) times daily. Gomez-Caraballo, Maria, MD Past Week Active Self, Pharmacy Records           Med Note (CRUTHIS, CHLOE C   Wed Jun 20, 2024 11:52 AM)    metoprolol  tartrate (LOPRESSOR ) 50 MG tablet [id number] No Take 1 tablet (50 mg total) by mouth 2 (two) times daily. Vann, Jessica U, DO Past Week Active Self, Pharmacy Records  pantoprazole  (PROTONIX ) 40 MG tablet [id number] No Take 1 tablet (40 mg total) by mouth daily.  Patient not taking: Reported on 07/12/2024   Sebastian Toribio GAILS, MD Not Taking Active Self, Pharmacy Records  predniSONE  (DELTASONE ) 10 MG tablet  [id number]  Take 4 tablets (40 mg total) by mouth daily for 3 days, THEN 3 tablets (30 mg total) daily for 3 days, THEN 2 tablets (20 mg total) daily for 3 days, THEN 1 tablet (10 mg total) daily for 3 days. Lue Elsie BROCKS, MD  Active   umeclidinium-vilanterol (ANORO ELLIPTA ) 62.5-25 MCG/ACT AEPB [id number]  Inhale 1 puff into the lungs daily. Lue Elsie BROCKS, MD  Active   Varenicline  Tartrate, Starter, 0.5 MG X 11 & 1 MG X 42 TBPK [id number]  Take 1 Package by mouth as directed.  Patient not taking: Reported on 07/16/2024   Lue Elsie BROCKS, MD  Active             Recommendation:   Continue Current Plan of Care  Follow Up Plan:   Telephone follow-up in 1 week  Medford Moises, BSN, RN [place]  VBCI - [facility] Health RN Care Manager [phone]

## 2024-08-01 ENCOUNTER — Telehealth: Payer: Self-pay

## 2024-08-02 ENCOUNTER — Other Ambulatory Visit: Payer: Self-pay

## 2024-08-02 ENCOUNTER — Telehealth: Payer: Self-pay

## 2024-08-02 NOTE — Patient Instructions (Signed)
 Visit Information  Thank you for taking time to visit with me today. Please don't hesitate to contact me if I can be of assistance to you before our next scheduled appointment.  Your next care management appointment is no further scheduled appointments.   Patient has met all care management goals. Care Management case will be closed. Patient has been provided contact information should new needs arise.   Please call the care guide team at [phone] if you need to cancel, schedule, or reschedule an appointment.   Please call the Suicide and Crisis Lifeline: 988 go to [facility] Urgent [facility] [address], [place] [phone]) call 911 if you are experiencing a Mental Health or Behavioral Health Crisis or need someone to talk to.  Burt Casco, BSW [place]/VBCI - [employer] Social Worker [phone]

## 2024-08-02 NOTE — Telephone Encounter (Signed)
 Reached out to patient to  start  her COPD PROJECT RESEARCH .SABRASABRA She is to call me back due to she was busy at the moment .SABRA Please forward call to me .SABRA Thanks in advance

## 2024-08-02 NOTE — Patient Outreach (Signed)
 Complex Care Management   Visit Note  08/02/2024  Name:  [name] MRN: [id number] DOB: 12/10/1950  Situation: Referral received for Complex Care Management related to SDOH Barriers:  Food insecurity I obtained verbal consent from Patient.  Visit completed with Patient  on the phone  Background:   Past Medical History:  Diagnosis Date   Acute sinusitis 01/09/2019   Acute sinusitis 01/09/2019   Anxiety    Arthritis    fingers, knees (08/16/2018)   Asthma    Atopic dermatitis 03/20/2018   Axillary hidradenitis suppurativa 10/13/2018   Cancer of right breast (HCC) 1991   s/p lumpectomy, chemotherapy and radiation therapy in 1991. Mammogram in 2007 was normal.   Cellulitis of buttock, left 12/13/2022   Constipated    h/o   COPD (chronic obstructive pulmonary disease) (HCC)    History of multiple hospital admissions for exercabation    COPD exacerbation (HCC) 01/01/2019   COPD exacerbation (HCC) 02/17/2024   COPD with acute exacerbation (HCC) 01/14/2019   COPD with exacerbation (HCC) 04/06/2009   Qualifier: Diagnosis of  By: Loletta MD, Vijay     Depression    Diarrhea    h/o   Facial cellulitis 08/30/2022   Facial edema 08/31/2022   GERD (gastroesophageal reflux disease)    Grade I diastolic dysfunction 01/12/2024   Headache    a few times/month (08/16/2018   Heart murmur 10/05/2011   first time I ever heard I had one was today   History of breast cancer 12/01/2012   Pt with h/o breast CA s/p lumpectomy with chemo/radiation in 1991. Pt mammogram 2011 was unremarkable. Had CT chest 10/2012 in ED for SOB and showed spiculated nodule with lymph node. 12/04/12: Birads 2; repeat diagnostic mammogram in 1 year.     Hyperlipidemia    Hypertension    Lower extremity edema 09/21/2018   Obesity    Personal history of chemotherapy    Personal history of radiation therapy    Pneumonia    couple times in the last 10-15 yrs (08/16/2018)   QT prolongation 08/08/2014    Seasonal allergies 02/25/2017   Shortness of breath 10/05/2011   at rest; lying down; w/exertion   Sigmoid diverticulitis 80/2008   Tobacco abuse    Type 2 diabetes mellitus (HCC) 05/14/2009   Type II diabetes mellitus (HCC)     Assessment: BSW held f/u with pt. Pt was alert and cognitive. Pt states she has not checked her mail to see if she received resources from [employer]. Pt states she has been going back and fourth between her house and her daughters house. BSW encouraged her to check her mail for food resources and explained to pt that he will provide October's distribution schedule for out of the garden food project. Pt understood and was appreciative of the support. Pt confirmed no other needs at this time and agreed to close case out with BSW. BSW provided instructions on how to get connected to services again in the future should her needs change. Pt understood.    SDOH Interventions    Flowsheet Row Patient Outreach Telephone from 07/19/2024 in [place] [facility] Telephone from 07/16/2024 in [place] [facility] DEPARTMENT Telephone from 06/27/2024 in [place] POPULATION HEALTH DEPARTMENT Patient Outreach Telephone from 06/12/2024 in  POPULATION HEALTH DEPARTMENT Patient Outreach Telephone from 05/28/2024 in  POPULATION HEALTH DEPARTMENT ED to Hosp-Admission (Discharged) from 05/13/2024 in [place] [facility]  SDOH Interventions  Food Insecurity Interventions [employer] Provided  [patient was provided with out-of-the garden food project distribution schedule.] AMB Referral Intervention Not Indicated -- Intervention Not Indicated Intervention Not Indicated, Inpatient TOC  Housing Interventions Intervention Not Indicated Intervention Not Indicated Intervention Not Indicated -- Intervention Not Indicated Intervention Not Indicated, Inpatient TOC  Transportation Interventions Intervention Not  Indicated, Patient Resources (Friends/Family)  Dentist and brothers sometimes provide transportation if husband cannot provide transportation.] Intervention Not Indicated Intervention Not Indicated -- Intervention Not Indicated Intervention Not Indicated, Inpatient TOC  Utilities Interventions Intervention Not Indicated Intervention Not Indicated Intervention Not Indicated -- Intervention Not Indicated Intervention Not Indicated, Inpatient TOC  Depression Interventions/Treatment  -- Counseling -- Counseling Counseling --  Financial Strain Interventions Intervention Not Indicated -- -- -- -- --  Physical Activity Interventions -- -- -- -- Other (Comments) --  Stress Interventions -- -- -- Provide Counseling -- --      Recommendation:   Review mailed food resources.   Follow Up Plan:   Patient has met all care management goals. Care Management case will be closed. Patient has been provided contact information should new needs arise.   Laymon Doll, BSW [place]/VBCI - [employer] Social Worker [phone]

## 2024-08-04 ENCOUNTER — Other Ambulatory Visit: Payer: Self-pay

## 2024-08-04 ENCOUNTER — Encounter (HOSPITAL_COMMUNITY): Payer: Self-pay | Admitting: Internal Medicine

## 2024-08-04 ENCOUNTER — Emergency Department (HOSPITAL_COMMUNITY)

## 2024-08-04 ENCOUNTER — Observation Stay (HOSPITAL_COMMUNITY)
Admission: EM | Admit: 2024-08-04 | Discharge: 2024-08-05 | Disposition: A | Discharge status: Home / Self Care | Attending: Internal Medicine | Admitting: Internal Medicine

## 2024-08-04 DIAGNOSIS — F17210 Nicotine dependence, cigarettes, uncomplicated: Secondary | ICD-10-CM | POA: Diagnosis not present

## 2024-08-04 DIAGNOSIS — I10 Essential (primary) hypertension: Secondary | ICD-10-CM | POA: Diagnosis not present

## 2024-08-04 DIAGNOSIS — F10920 Alcohol use, unspecified with intoxication, uncomplicated: Secondary | ICD-10-CM | POA: Insufficient documentation

## 2024-08-04 DIAGNOSIS — Z79899 Other long term (current) drug therapy: Secondary | ICD-10-CM | POA: Diagnosis not present

## 2024-08-04 DIAGNOSIS — K219 Gastro-esophageal reflux disease without esophagitis: Secondary | ICD-10-CM | POA: Insufficient documentation

## 2024-08-04 DIAGNOSIS — Z6830 Body mass index (BMI) 30.0-30.9, adult: Secondary | ICD-10-CM | POA: Insufficient documentation

## 2024-08-04 DIAGNOSIS — R062 Wheezing: Secondary | ICD-10-CM | POA: Diagnosis not present

## 2024-08-04 DIAGNOSIS — F17220 Nicotine dependence, chewing tobacco, uncomplicated: Secondary | ICD-10-CM | POA: Diagnosis not present

## 2024-08-04 DIAGNOSIS — J441 Chronic obstructive pulmonary disease with (acute) exacerbation: Principal | ICD-10-CM

## 2024-08-04 DIAGNOSIS — E119 Type 2 diabetes mellitus without complications: Secondary | ICD-10-CM | POA: Insufficient documentation

## 2024-08-04 DIAGNOSIS — R0902 Hypoxemia: Secondary | ICD-10-CM

## 2024-08-04 DIAGNOSIS — E66811 Obesity, class 1: Secondary | ICD-10-CM | POA: Insufficient documentation

## 2024-08-04 DIAGNOSIS — F17200 Nicotine dependence, unspecified, uncomplicated: Secondary | ICD-10-CM | POA: Diagnosis present

## 2024-08-04 DIAGNOSIS — Z794 Long term (current) use of insulin: Secondary | ICD-10-CM | POA: Diagnosis not present

## 2024-08-04 DIAGNOSIS — R0602 Shortness of breath: Secondary | ICD-10-CM | POA: Diagnosis not present

## 2024-08-04 LAB — CBC
HCT: 34.1 % — ABNORMAL LOW (ref 36.0–46.0)
Hemoglobin: 10.8 g/dL — ABNORMAL LOW (ref 12.0–15.0)
MCH: 28.8 pg (ref 26.0–34.0)
MCHC: 31.7 g/dL (ref 30.0–36.0)
MCV: 90.9 fL (ref 80.0–100.0)
Platelets: 304 K/uL (ref 150–400)
RBC: 3.75 MIL/uL — ABNORMAL LOW (ref 3.87–5.11)
RDW: 15.4 % (ref 11.5–15.5)
WBC: 5.8 K/uL (ref 4.0–10.5)
nRBC: 0 % (ref 0.0–0.2)

## 2024-08-04 LAB — RESP PANEL BY RT-PCR (RSV, FLU A&B, COVID)  RVPGX2
Influenza A by PCR: NEGATIVE
Influenza B by PCR: NEGATIVE
Resp Syncytial Virus by PCR: NEGATIVE
SARS Coronavirus 2 by RT PCR: NEGATIVE

## 2024-08-04 LAB — BASIC METABOLIC PANEL WITH GFR
Anion gap: 14 (ref 5–15)
BUN: 9 mg/dL (ref 8–23)
CO2: 20 mmol/L — ABNORMAL LOW (ref 22–32)
Calcium: 8.9 mg/dL (ref 8.9–10.3)
Chloride: 107 mmol/L (ref 98–111)
Creatinine, Ser: 0.71 mg/dL (ref 0.44–1.00)
GFR, Estimated: >60 mL/min (ref >60)
Glucose, Bld: 100 mg/dL — ABNORMAL HIGH (ref 70–99)
Potassium: 4 mmol/L (ref 3.5–5.1)
Sodium: 141 mmol/L (ref 135–145)

## 2024-08-04 MED ORDER — ALBUTEROL SULFATE (2.5 MG/3ML) 0.083% IN NEBU
2.5000 mg | INHALATION_SOLUTION | RESPIRATORY_TRACT | Status: DC | PRN
  Administered 2024-08-04 – 2024-08-05 (×3): 2.5 mg via RESPIRATORY_TRACT
  Filled 2024-08-04 (×3): qty 3

## 2024-08-04 MED ORDER — ACETAMINOPHEN 325 MG PO TABS: 650.0000 mg | ORAL_TABLET | Freq: Four times a day (QID) | ORAL | Status: DC | PRN

## 2024-08-04 MED ORDER — IPRATROPIUM-ALBUTEROL 0.5-2.5 (3) MG/3ML IN SOLN
3.0000 mL | Freq: Once | RESPIRATORY_TRACT | Status: AC
  Administered 2024-08-04: 3 mL via RESPIRATORY_TRACT
  Filled 2024-08-04: qty 3

## 2024-08-04 MED ORDER — TRAZODONE HCL 50 MG PO TABS
25.0000 mg | ORAL_TABLET | Freq: Every evening | ORAL | Status: DC | PRN
  Administered 2024-08-05: 25 mg via ORAL
  Filled 2024-08-04: qty 1

## 2024-08-04 MED ORDER — LOSARTAN POTASSIUM 50 MG PO TABS
50.0000 mg | ORAL_TABLET | Freq: Two times a day (BID) | ORAL | Status: DC
  Administered 2024-08-04 – 2024-08-05 (×3): 50 mg via ORAL
  Filled 2024-08-04 (×3): qty 1

## 2024-08-04 MED ORDER — HYDROCHLOROTHIAZIDE 12.5 MG PO TABS
12.5000 mg | ORAL_TABLET | Freq: Two times a day (BID) | ORAL | Status: DC
Start: 2024-08-04 — End: 2024-08-05
  Administered 2024-08-04 – 2024-08-05 (×3): 12.5 mg via ORAL
  Filled 2024-08-04 (×3): qty 1

## 2024-08-04 MED ORDER — LOSARTAN POTASSIUM-HCTZ 50-12.5 MG PO TABS
1.0000 | ORAL_TABLET | Freq: Two times a day (BID) | ORAL | Status: DC
Start: 2024-08-04 — End: 2024-08-04

## 2024-08-04 MED ORDER — AMLODIPINE BESYLATE 10 MG PO TABS
10.0000 mg | ORAL_TABLET | Freq: Every day | ORAL | Status: DC
Start: 2024-08-05 — End: 2024-08-05
  Administered 2024-08-05: 10 mg via ORAL
  Filled 2024-08-04 (×2): qty 1

## 2024-08-04 MED ORDER — ACETAMINOPHEN 650 MG RE SUPP: 650.0000 mg | Freq: Four times a day (QID) | RECTAL | Status: DC | PRN

## 2024-08-04 MED ORDER — HYDROCHLOROTHIAZIDE 12.5 MG PO TABS
12.5000 mg | ORAL_TABLET | Freq: Once | ORAL | Status: AC
  Administered 2024-08-04: 12.5 mg via ORAL
  Filled 2024-08-04: qty 1

## 2024-08-04 MED ORDER — ENOXAPARIN SODIUM 40 MG/0.4ML IJ SOSY
40.0000 mg | PREFILLED_SYRINGE | INTRAMUSCULAR | Status: DC
Start: 2024-08-04 — End: 2024-08-05
  Administered 2024-08-04 – 2024-08-05 (×2): 40 mg via SUBCUTANEOUS
  Filled 2024-08-04 (×2): qty 0.4

## 2024-08-04 MED ORDER — ALBUTEROL SULFATE (2.5 MG/3ML) 0.083% IN NEBU
10.0000 mg/h | INHALATION_SOLUTION | Freq: Once | RESPIRATORY_TRACT | Status: AC
  Administered 2024-08-04: 10 mg/h via RESPIRATORY_TRACT
  Filled 2024-08-04: qty 3

## 2024-08-04 MED ORDER — ONDANSETRON HCL 4 MG/2ML IJ SOLN: 4.0000 mg | Freq: Four times a day (QID) | INTRAMUSCULAR | Status: DC | PRN

## 2024-08-04 MED ORDER — METOPROLOL TARTRATE 50 MG PO TABS
50.0000 mg | ORAL_TABLET | Freq: Two times a day (BID) | ORAL | Status: DC
Start: 2024-08-04 — End: 2024-08-05
  Administered 2024-08-04 – 2024-08-05 (×3): 50 mg via ORAL
  Filled 2024-08-04 (×3): qty 1

## 2024-08-04 MED ORDER — AMLODIPINE BESYLATE 5 MG PO TABS
10.0000 mg | ORAL_TABLET | Freq: Once | ORAL | Status: AC
  Administered 2024-08-04: 10 mg via ORAL
  Filled 2024-08-04: qty 2

## 2024-08-04 MED ORDER — PREDNISONE 20 MG PO TABS
40.0000 mg | ORAL_TABLET | Freq: Every day | ORAL | Status: DC
  Administered 2024-08-04 – 2024-08-05 (×2): 40 mg via ORAL
  Filled 2024-08-04 (×2): qty 2

## 2024-08-04 MED ORDER — IPRATROPIUM-ALBUTEROL 0.5-2.5 (3) MG/3ML IN SOLN
3.0000 mL | Freq: Four times a day (QID) | RESPIRATORY_TRACT | Status: DC
  Administered 2024-08-04 (×4): 3 mL via RESPIRATORY_TRACT
  Filled 2024-08-04 (×4): qty 3
  Filled 2024-08-04: qty 6

## 2024-08-04 MED ORDER — LOSARTAN POTASSIUM 50 MG PO TABS
50.0000 mg | ORAL_TABLET | ORAL | Status: AC
  Administered 2024-08-04: 50 mg via ORAL
  Filled 2024-08-04: qty 1

## 2024-08-04 MED ORDER — METHYLPREDNISOLONE SODIUM SUCC 125 MG IJ SOLR
125.0000 mg | INTRAMUSCULAR | Status: AC
  Administered 2024-08-04: 125 mg via INTRAVENOUS
  Filled 2024-08-04: qty 2

## 2024-08-04 MED ORDER — MAGNESIUM SULFATE IN D5W 1-5 GM/100ML-% IV SOLN
1.0000 g | Freq: Once | INTRAVENOUS | Status: AC
  Administered 2024-08-04: 1 g via INTRAVENOUS
  Filled 2024-08-04: qty 100

## 2024-08-04 MED ORDER — ONDANSETRON HCL 4 MG PO TABS: 4.0000 mg | ORAL_TABLET | Freq: Four times a day (QID) | ORAL | Status: DC | PRN

## 2024-08-04 NOTE — H&P (Signed)
 History and Physical  [name] FMW:[id number] DOB: [birth year] DOA: 08/04/2024  PCP: Elnora Ip, MD   Chief Complaint: Shortness of breath, wheezing  HPI: [name] is a 73 y.o. female with medical history significant for ongoing tobacco abuse, multiple hospitalizations with acute exacerbation of COPD, hypertension, hyperlipidemia and depression presents to the ED with 2 days of shortness of breath, nonproductive cough, wheezing due to recurrent acute exacerbation of COPD.  She denies any chest pain, fevers, nausea, vomiting, sick contacts.  She continues to smoke when she returns home, but has been cutting back.  Workup in the emergency department as detailed below show evidence of acute exacerbation of COPD, no acute lab or imaging abnormalities.  Patient was given IV Solu-Medrol , breathing treatments and hospitalist admission was requested.  Review of Systems: Please see HPI for pertinent positives and negatives. A complete 10 system review of systems are otherwise negative.  Past Medical History:  Diagnosis Date   Acute sinusitis 01/09/2019   Acute sinusitis 01/09/2019   Anxiety    Arthritis    fingers, knees (08/16/2018)   Asthma    Atopic dermatitis 03/20/2018   Axillary hidradenitis suppurativa 10/13/2018   Cancer of right breast (HCC) 1991   s/p lumpectomy, chemotherapy and radiation therapy in 1991. Mammogram in 2007 was normal.   Cellulitis of buttock, left 12/13/2022   Constipated    h/o   COPD (chronic obstructive pulmonary disease) (HCC)    History of multiple hospital admissions for exercabation    COPD exacerbation (HCC) 01/01/2019   COPD exacerbation (HCC) 02/17/2024   COPD with acute exacerbation (HCC) 01/14/2019   COPD with exacerbation (HCC) 04/06/2009   Qualifier: Diagnosis of  By: Loletta MD, Vijay     Depression    Diarrhea    h/o   Facial cellulitis 08/30/2022   Facial edema 08/31/2022   GERD (gastroesophageal reflux  disease)    Grade I diastolic dysfunction 01/12/2024   Headache    a few times/month (08/16/2018   Heart murmur 10/05/2011   first time I ever heard I had one was today   History of breast cancer 12/01/2012   Pt with h/o breast CA s/p lumpectomy with chemo/radiation in 1991. Pt mammogram 2011 was unremarkable. Had CT chest 10/2012 in ED for SOB and showed spiculated nodule with lymph node. 12/04/12: Birads 2; repeat diagnostic mammogram in 1 year.     Hyperlipidemia    Hypertension    Lower extremity edema 09/21/2018   Obesity    Personal history of chemotherapy    Personal history of radiation therapy    Pneumonia    couple times in the last 10-15 yrs (08/16/2018)   QT prolongation 08/08/2014   Seasonal allergies 02/25/2017   Shortness of breath 10/05/2011   at rest; lying down; w/exertion   Sigmoid diverticulitis 80/2008   Tobacco abuse    Type 2 diabetes mellitus (HCC) 05/14/2009   Type II diabetes mellitus (HCC)    Past Surgical History:  Procedure Laterality Date   ABDOMINAL HYSTERECTOMY     ANTERIOR CERVICAL DECOMP/DISCECTOMY FUSION  2012   Dr. Beuford  put plate in; did something to my vertebrae   BACK SURGERY     BREAST LUMPECTOMY Right 1991   DOBUTAMINE  STRESS ECHO  08/2004   Inferior ischemia, normal LV systolic function, no significant CAD   MINOR IRRIGATION AND DEBRIDEMENT OF WOUND Right 09/04/2022   Procedure: IRRIGATION AND DEBRIDEMENT OF NASAL WOUND;  Surgeon: Luciano Standing, MD;  Location: [facility];  Service: ENT;  Laterality: Right;   Social History:  reports that she has been smoking cigarettes. She has a 22.5 pack-year smoking history. She has never used smokeless tobacco. She reports current alcohol  use of about 4.0 standard drinks of alcohol  per week. She reports that she does not use drugs.  Allergies  Allergen Reactions   Ace Inhibitors Anaphylaxis, Swelling and Other (See Comments)    Throat swelling  (Re: Lisinopril -hydrochlorothiazide )  Pt  questioned if she is allergic in 2025.    Family History  Problem Relation Age of Onset   Cancer Mother        unknown   Breast cancer Daughter 75   Breast cancer Daughter        63s   BRCA 1/2 Neg Hx      Prior to Admission medications   Medication Sig Start Date End Date Taking? Authorizing Provider  acetaminophen  (TYLENOL ) 500 MG tablet Take 1,000 mg by mouth daily as needed for moderate pain (pain score 4-6) or mild pain (pain score 1-3).   Yes [provider]  albuterol  (PROVENTIL ) (2.5 MG/3ML) 0.083% nebulizer solution Take 3 mLs (2.5 mg total) by nebulization every 4 (four) hours as needed for wheezing or shortness of breath. 04/21/24  Yes Elnora Ip, MD  albuterol  (VENTOLIN  HFA) 108 [phone] Base) MCG/ACT inhaler Inhale 2 puffs into the lungs every 6 (six) hours as needed for wheezing or shortness of breath. 01/10/24  Yes Jolaine Pac, DO  amLODipine  (NORVASC ) 10 MG tablet Take 1 tablet (10 mg total) by mouth daily. 03/19/24 03/19/25 Yes Fernand Prost, MD  fluticasone  (FLONASE ) 50 MCG/ACT nasal spray Place 2 sprays into both nostrils daily. Patient taking differently: Place 2 sprays into both nostrils daily as needed. 05/30/24  Yes Sebastian Toribio GAILS, MD  Fluticasone  Furoate (ARNUITY ELLIPTA ) 100 MCG/ACT AEPB Inhale 1 puff into the lungs daily at 8 pm. Patient taking differently: Inhale 1 puff into the lungs daily as needed. 07/14/24 09/12/24 Yes Lue Elsie BROCKS, MD  losartan -hydrochlorothiazide  (HYZAAR) 50-12.5 MG tablet Take 1 tablet by mouth 2 (two) times daily. 03/20/24  Yes Elnora Ip, MD  metoprolol  tartrate (LOPRESSOR ) 50 MG tablet Take 1 tablet (50 mg total) by mouth 2 (two) times daily. 04/22/24  Yes Vann, Jessica U, DO  pantoprazole  (PROTONIX ) 40 MG tablet Take 1 tablet (40 mg total) by mouth daily. Patient not taking: Reported on 07/12/2024 05/30/24   Sebastian Toribio GAILS, MD  umeclidinium-vilanterol (ANORO ELLIPTA ) 62.5-25 MCG/ACT AEPB Inhale 1 puff  into the lungs daily. 07/15/24   Lue Elsie BROCKS, MD  Varenicline  Tartrate, Starter, 0.5 MG X 11 & 1 MG X 42 TBPK Take 1 Package by mouth as directed. Patient not taking: Reported on 07/16/2024 07/14/24   Lue Elsie BROCKS, MD    Physical Exam: BP (!) 136/100   Pulse 88   Temp 98.3 F (36.8 C)   Resp (!) 31   SpO2 99%  General:  Alert, oriented, calm, in no acute distress, resting comfortably on room air Eyes: EOMI, clear conjuctivae, white sclerea Neck: supple, no masses, trachea mildline  Cardiovascular: RRR, no murmurs or rubs, no peripheral edema  Respiratory: Good equal bilateral air entry, without tachypnea or respiratory distress.  There is diffuse rhonchi and end expiratory wheezing. Abdomen: soft, nontender, nondistended, normal bowel tones heard  Skin: dry, no rashes  Musculoskeletal: no joint effusions, normal range of motion  Psychiatric: appropriate affect, normal speech  Neurologic: extraocular muscles intact, clear speech, moving all extremities  with intact sensorium         Labs on Admission:  Basic Metabolic Panel: Recent Labs  Lab 08/04/24 0141  NA 141  K 4.0  CL 107  CO2 20*  GLUCOSE 100*  BUN 9  CREATININE 0.71  CALCIUM  8.9   Liver Function Tests: No results for input(s): AST, ALT, ALKPHOS, BILITOT, PROT, ALBUMIN in the last 168 hours. No results for input(s): LIPASE, AMYLASE in the last 168 hours. No results for input(s): AMMONIA in the last 168 hours. CBC: Recent Labs  Lab 08/04/24 0141  WBC 5.8  HGB 10.8*  HCT 34.1*  MCV 90.9  PLT 304   Cardiac Enzymes: No results for input(s): CKTOTAL, CKMB, CKMBINDEX, TROPONINI in the last 168 hours. BNP (last 3 results) Recent Labs    02/17/24 1633 04/18/24 1809 06/20/24 0825  BNP 59.5 108.1* 161.8*    ProBNP (last 3 results) No results for input(s): PROBNP in the last 8760 hours.  CBG: No results for input(s): GLUCAP in the last 168 hours.  Radiological  Exams on Admission: DG Chest 2 View Result Date: 08/04/2024 EXAM: 2 VIEW(S) XRAY OF THE CHEST 08/04/2024 02:11:44 AM COMPARISON: 07/11/2024 CLINICAL HISTORY: Shortness of breath. Per chart - Patient c/o SOB x 1 hour. Patient report taking inhaler without relief. Patient wheezing at triage. Patient denies Chest pain. HX COPD, asthma FINDINGS: LUNGS AND PLEURA: Hyperinflation. No focal consolidation or pleural effusion. No pneumothorax. HEART AND MEDIASTINUM: Overlapping cardiac leads. No acute abnormality of the cardiac and mediastinal silhouettes. BONES AND SOFT TISSUES: Surgical clips in right axillary region. Degenerative changes of spine. Old right rib fractures. Fixation hardware along lower cervical spine. IMPRESSION: 1. Hyperinflation. No acute cardiopulmonary process. Electronically signed by: Norman Gatlin MD 08/04/2024 02:35 AM EDT RP Workstation: [id number]   Assessment/Plan [name] is a 73 y.o. female with medical history significant for ongoing tobacco abuse, multiple hospitalizations with acute exacerbation of COPD, hypertension, hyperlipidemia and depression presents to the ED with 2 days of shortness of breath, nonproductive cough, wheezing due to recurrent acute exacerbation of COPD.  AECOPD-presents with 2 days of worsening shortness of breath, wheezing and increased nonproductive cough.  She has expiratory wheezing and rhonchi on examination.  Workup negative for evidence of viral or acute bacterial infection. -Observation admission -Received IV Solu-Medrol , will start prednisone  40 mg p.o. daily this morning -Scheduled DuoNebs and as needed albuterol  inhaler -Incentive spirometer and flutter valve -Monitor for hypoxia, currently stable on room air  Hypertension-patient was initially hypertensive due to not taking her home medications yesterday.  Blood pressure is now improved with resumption of her home medication. -Continue home amlodipine , Hyzaar and  Lopressor   GERD-Protonix   Tobacco use disorder-detailed discussion with the patient about the importance of smoking cessation, and continued tobacco abuse is the cause of her recurrent admissions for COPD exacerbation.  DVT prophylaxis: Lovenox      Code Status: Full Code  Consults called: None  Admission status: Observation  Time spent: 49 minutes  Alyscia Carmon CHRISTELLA Gail MD [facility] Pager [phone]  If 7PM-7AM, please contact night-coverage [email or web address] Password [email or web address]  08/04/2024, 7:41 AM

## 2024-08-04 NOTE — ED Notes (Signed)
 Pt with DOE and increase wheezing upon trying to ambulate. 02 sat 90% ambulating with noted increased shob.

## 2024-08-04 NOTE — ED Triage Notes (Signed)
 Patient c/o SOB x 1 hour. Patient report taking inhaler without relief. Patient wheezing at triage. Patient denies Chest pain. HX COPD, asthma

## 2024-08-04 NOTE — Progress Notes (Signed)
 Pt on tele box 52, NSR

## 2024-08-04 NOTE — ED Provider Notes (Addendum)
 [place] [facility]ERGENCY DEPARTMENT AT [facility] Provider Note   CSN: [id number] Arrival date & time: 08/04/24  0122     Patient presents with: Shortness of Breath   [name] is a 73 y.o. female with history of COPD, type 2 diabetes, chronic right hip pain, hypertensive emergency.  Patient presents to ED for evaluation of shortness of breath.  States that she has been short of breath over the last day and this has progressively worsened.  She attributes this to her COPD.  She reports that she still smokes.  She denies chest pain, fevers, nausea or vomiting, headache or blurred vision.  She arrives hypertensive and states that she has taken her blood pressure medication.  She denies leg swelling, lightheadedness, dizziness or weakness.  She reports that she attempted to use her at home inhalers without relief.  Was admitted earlier this month for COPD exacerbation.   Shortness of Breath      Prior to Admission medications   Medication Sig Start Date End Date Taking? Authorizing Provider  acetaminophen  (TYLENOL ) 500 MG tablet Take 1,000 mg by mouth daily as needed for moderate pain (pain score 4-6) or mild pain (pain score 1-3).   Yes [provider]  albuterol  (PROVENTIL ) (2.5 MG/3ML) 0.083% nebulizer solution Take 3 mLs (2.5 mg total) by nebulization every 4 (four) hours as needed for wheezing or shortness of breath. 04/21/24  Yes Elnora Ip, MD  albuterol  (VENTOLIN  HFA) 108 (90 Base) MCG/ACT inhaler Inhale 2 puffs into the lungs every 6 (six) hours as needed for wheezing or shortness of breath. 01/10/24  Yes Jolaine Pac, DO  amLODipine  (NORVASC ) 10 MG tablet Take 1 tablet (10 mg total) by mouth daily. 03/19/24 03/19/25 Yes Fernand Prost, MD  fluticasone  (FLONASE ) 50 MCG/ACT nasal spray Place 2 sprays into both nostrils daily. Patient taking differently: Place 2 sprays into both nostrils daily as needed. 05/30/24  Yes Sebastian Toribio GAILS, MD  Fluticasone   Furoate (ARNUITY ELLIPTA ) 100 MCG/ACT AEPB Inhale 1 puff into the lungs daily at 8 pm. Patient taking differently: Inhale 1 puff into the lungs daily as needed. 07/14/24 09/12/24 Yes Lue Elsie BROCKS, MD  losartan -hydrochlorothiazide  (HYZAAR) 50-12.5 MG tablet Take 1 tablet by mouth 2 (two) times daily. 03/20/24  Yes Elnora Ip, MD  metoprolol  tartrate (LOPRESSOR ) 50 MG tablet Take 1 tablet (50 mg total) by mouth 2 (two) times daily. 04/22/24  Yes Vann, Jessica U, DO  pantoprazole  (PROTONIX ) 40 MG tablet Take 1 tablet (40 mg total) by mouth daily. Patient not taking: Reported on 07/12/2024 05/30/24   Sebastian Toribio GAILS, MD  umeclidinium-vilanterol (ANORO ELLIPTA ) 62.5-25 MCG/ACT AEPB Inhale 1 puff into the lungs daily. 07/15/24   Lue Elsie BROCKS, MD  Varenicline  Tartrate, Starter, 0.5 MG X 11 & 1 MG X 42 TBPK Take 1 Package by mouth as directed. Patient not taking: Reported on 07/16/2024 07/14/24   Lue Elsie BROCKS, MD    Allergies: Ace inhibitors    Review of Systems  Respiratory:  Positive for shortness of breath.     Updated Vital Signs BP (!) 136/100   Pulse 88   Temp 98.7 F (37.1 C) (Oral)   Resp (!) 31   SpO2 99%   Physical Exam Vitals and nursing note reviewed.  Constitutional:      General: She is not in acute distress.    Appearance: She is well-developed.  HENT:     Head: Normocephalic and atraumatic.  Eyes:     Conjunctiva/sclera:  Conjunctivae normal.  Cardiovascular:     Rate and Rhythm: Normal rate and regular rhythm.     Heart sounds: No murmur heard. Pulmonary:     Effort: Pulmonary effort is normal. No respiratory distress.     Breath sounds: Wheezing and rales present.  Abdominal:     Palpations: Abdomen is soft.     Tenderness: There is no abdominal tenderness.  Musculoskeletal:        General: No swelling.     Cervical back: Neck supple.     Right lower leg: No edema.     Left lower leg: No edema.  Skin:    General: Skin is warm and dry.      Capillary Refill: Capillary refill takes less than 2 seconds.  Neurological:     Mental Status: She is alert.  Psychiatric:        Mood and Affect: Mood normal.     (all labs ordered are listed, but only abnormal results are displayed) Labs Reviewed  BASIC METABOLIC PANEL WITH GFR - Abnormal; Notable for the following components:      Result Value   CO2 20 (*)    Glucose, Bld 100 (*)    All other components within normal limits  CBC - Abnormal; Notable for the following components:   RBC 3.75 (*)    Hemoglobin 10.8 (*)    HCT 34.1 (*)    All other components within normal limits  RESP PANEL BY RT-PCR (RSV, FLU A&B, COVID)  [id number]    EKG: EKG Interpretation Date/Time:  Saturday August 04 2024 01:41:29 EDT Ventricular Rate:  88 PR Interval:  137 QRS Duration:  92 QT Interval:  371 QTC Calculation: 449 R Axis:   67  Text Interpretation: Sinus rhythm Probable anteroseptal infarct, old No significant change since last tracing Confirmed by Trine Likes [phone]) on 08/04/2024 4:44:19 AM  Radiology: [facility] Chest 2 View Result Date: 08/04/2024 EXAM: 2 VIEW(S) XRAY OF THE CHEST 08/04/2024 02:11:44 AM COMPARISON: 07/11/2024 CLINICAL HISTORY: Shortness of breath. Per chart - Patient c/o SOB x 1 hour. Patient report taking inhaler without relief. Patient wheezing at triage. Patient denies Chest pain. HX COPD, asthma FINDINGS: LUNGS AND PLEURA: Hyperinflation. No focal consolidation or pleural effusion. No pneumothorax. HEART AND MEDIASTINUM: Overlapping cardiac leads. No acute abnormality of the cardiac and mediastinal silhouettes. BONES AND SOFT TISSUES: Surgical clips in right axillary region. Degenerative changes of spine. Old right rib fractures. Fixation hardware along lower cervical spine. IMPRESSION: 1. Hyperinflation. No acute cardiopulmonary process. Electronically signed by: Norman Gatlin MD 08/04/2024 02:35 AM EDT RP Workstation: [id number]    .Critical Care  Performed  by: Ruthell Lonni FALCON, PA-C Authorized by: Ruthell Lonni FALCON, PA-C   Critical care provider statement:    Critical care time (minutes):  35   Critical care time was exclusive of:  Separately billable procedures and treating other patients   Critical care was necessary to treat or prevent imminent or life-threatening deterioration of the following conditions:  Respiratory failure   Critical care was time spent personally by me on the following activities:  Blood draw for specimens, development of treatment plan with patient or surrogate, discussions with consultants, discussions with primary provider, evaluation of patient's response to treatment, examination of patient, interpretation of cardiac output measurements, obtaining history from patient or surrogate, ordering and performing treatments and interventions, ordering and review of laboratory studies, ordering and review of radiographic studies, pulse oximetry, re-evaluation of patient's condition and review of old charts  I assumed direction of critical care for this patient from another provider in my specialty: no     Care discussed with: admitting provider      Medications Ordered in the ED  ipratropium-albuterol  (DUONEB) 0.5-2.5 (3) MG/3ML nebulizer solution 3 mL (3 mLs Nebulization Given 08/04/24 0209)  magnesium  sulfate IVPB 1 g 100 mL (0 g Intravenous Stopped 08/04/24 0220)  methylPREDNISolone  sodium succinate (SOLU-MEDROL ) 125 mg/2 mL injection 125 mg (125 mg Intravenous Given 08/04/24 0205)  albuterol  (PROVENTIL ) (2.5 MG/3ML) 0.083% nebulizer solution (10 mg/hr Nebulization Given 08/04/24 0352)  amLODipine  (NORVASC ) tablet 10 mg (10 mg Oral Given 08/04/24 0352)  losartan  (COZAAR ) tablet 50 mg (50 mg Oral Given 08/04/24 0352)  hydrochlorothiazide  (HYDRODIURIL ) tablet 12.5 mg (12.5 mg Oral Given 08/04/24 0352)     Medical Decision Making Amount and/or Complexity of Data Reviewed Labs: ordered. Radiology:  ordered.  Risk Prescription drug management. Decision regarding hospitalization.   This is a 73 year old female presenting to the ED out of concern of shortness of breath.  On exam, the patient is audibly wheezing.  She is afebrile and nontachycardic.  Lung sounds have wheezing throughout, oxygen  saturation 99% room air sitting in bed.  Abdomen soft and compressible.  Neuroexam at baseline.  No edema to bilateral lower extremities.  Labs collected to include CBC, BMP, viral panel, chest x-ray, EKG.  Patient given DuoNeb, magnesium , Solu-Medrol .  Patient noted to be hypertensive.  States she has not taken her blood pressure medication.  Given home blood pressure medication amlodipine , HCTZ 12.5 mg, losartan  50 mg.  On reexamination the patient continues to wheeze.  Continues to nebulizer administered at this time.  After continuous duo nebulizer, patient continues to wheeze.  Patient ambulated through department and put oxygen  saturation drops to 90% and becomes tachypneic.  At this time we will admit patient to hospital for COPD exacerbation.  Discussed with Dr. Charlton of the [facility] hospitalist service.  He has agreed to admit the patient.  Patient amenable to plan.  Stable on admission.    Final diagnoses:  COPD exacerbation [facility])    ED Discharge Orders     None              Ruthell Lonni JULIANNA DEVONNA 08/04/24 0559    Trine Raynell Moder, MD 08/04/24 [phone]

## 2024-08-04 NOTE — ED Notes (Signed)
 ED TO INPATIENT HANDOFF REPORT  Name/Age/Gender [name] 73 y.o. female  Code Status    Code Status Orders  (From admission, onward)           Start     Ordered   08/04/24 0741  Full code  Continuous       Question:  By:  Answer:  Consent: discussion documented in EHR   08/04/24 0741           Code Status History     Date Active Date Inactive Code Status Order ID Comments User Context   07/11/2024 2150 07/14/2024 2036 Full Code [id number]  Lou Claretta HERO, MD ED   06/20/2024 1109 06/25/2024 1633 Full Code [id number]  Zella Katha HERO, MD ED   05/24/2024 1044 05/29/2024 2113 Full Code [id number]  Zella Katha HERO, MD ED   05/13/2024 0933 05/14/2024 1917 Full Code [id number]  Zella Katha HERO, MD Inpatient   04/18/2024 2253 04/22/2024 1652 Full Code [id number]  Lou Claretta HERO, MD ED   02/17/2024 1913 02/22/2024 1845 Full Code [id number]  Moody Alto, MD ED   01/12/2024 1151 01/13/2024 1620 Full Code [id number]  Celinda Alm Lot, MD ED   08/30/2022 1546 09/06/2022 2123 Full Code [id number]  Addie Perkins, DO Inpatient   08/14/2022 1800 08/25/2022 0112 Full Code [id number]  Franchot Novel, MD ED   02/21/2021 0925 02/23/2021 2215 Full Code [id number]  Arnett Saunders, MD ED   04/06/2019 1156 04/10/2019 1628 Full Code [id number]  Willette Adriana LABOR, MD ED   03/19/2019 1525 03/22/2019 1758 Full Code [id number]  Forest Coy, MD Inpatient   02/18/2019 2117 02/21/2019 2354 Full Code [id number]  Feliberto Cambric, MD ED   01/14/2019 0618 01/20/2019 1714 Full Code [id number]  Rosan Harlene Fickle, DO ED   01/01/2019 2316 01/02/2019 1946 Full Code [id number]  Rosan Harlene Fickle, DO ED   09/03/2018 2202 09/06/2018 2329 Full Code [id number]  Loreta Levels, MD ED   08/16/2018 1621 08/17/2018 1748 Full Code [id number]  Caleen Qualia, MD Inpatient   07/22/2016 1804 07/26/2016 1913 Full Code [id number]  Geofm Johana SAUNDERS, MD Inpatient   07/03/2015 0518 07/08/2015 1755 Full Code [id number]  Joshua Maryruth ORN, MD ED    06/21/2015 0327 06/23/2015 2214 Full Code [id number]  Joshua Maryruth ORN, MD Inpatient   06/07/2015 2116 06/10/2015 2038 Full Code [id number]  Tobie Darron GAILS, MD ED   05/15/2015 0810 05/17/2015 2030 Full Code [id number]  Rosan Dayton BROCKS, DO Inpatient   12/24/2014 1645 12/26/2014 1836 Full Code [id number]  Marcey Lamarr FALCON, MD Inpatient   09/11/2014 0732 09/18/2014 2258 Full Code [id number]  Tanda Marolyn HERO, DO Inpatient   08/12/2014 9187 08/13/2014 2130 Full Code [id number]  Marjo Nicky BIRCH, MD Inpatient   03/08/2014 0905 03/12/2014 1623 Full Code [id number]  Valdemar Anis, MD Inpatient   12/20/2012 1655 12/22/2012 1503 Full Code [id number]  Lucillie Degree, MD Inpatient       Home/SNF/Other Home  Chief Complaint COPD exacerbation (HCC) [J44.1] COPD with acute exacerbation (HCC) [J44.1]  Level of Care/Admitting Diagnosis ED Disposition     ED Disposition  Admit   Condition  --   Comment  Hospital Area: [facility] [[id number]]  Level of Care: Med-Surg [16]  May place patient in observation at [facility] or [facility] if equivalent level of care is available:: Yes  Covid Evaluation: Asymptomatic - no recent exposure (last 10 days) testing not required  Diagnosis: COPD with acute exacerbation [facility]) [[id number]]  Admitting Physician: ZELLA,  MIR CHRISTELLA [[id number]]  Attending Physician: ZELLA, KATHA CHRISTELLA [[id number]]  For patients discharging to extended facilities (i.e. SNF, AL, group homes or LTAC) initiate:: Discharge to SNF/Facility Placement COVID-19 Lab Testing Protocol          Medical History Past Medical History:  Diagnosis Date   Acute sinusitis 01/09/2019   Acute sinusitis 01/09/2019   Anxiety    Arthritis    fingers, knees (08/16/2018)   Asthma    Atopic dermatitis 03/20/2018   Axillary hidradenitis suppurativa 10/13/2018   Cancer of right breast (HCC) 1991   s/p lumpectomy, chemotherapy and radiation therapy in 1991. Mammogram in 2007 was normal.   Cellulitis of buttock, left  12/13/2022   Constipated    h/o   COPD (chronic obstructive pulmonary disease) (HCC)    History of multiple hospital admissions for exercabation    COPD exacerbation (HCC) 01/01/2019   COPD exacerbation (HCC) 02/17/2024   COPD with acute exacerbation (HCC) 01/14/2019   COPD with exacerbation (HCC) 04/06/2009   Qualifier: Diagnosis of  By: Loletta MD, Vijay     Depression    Diarrhea    h/o   Facial cellulitis 08/30/2022   Facial edema 08/31/2022   GERD (gastroesophageal reflux disease)    Grade I diastolic dysfunction 01/12/2024   Headache    a few times/month (08/16/2018   Heart murmur 10/05/2011   first time I ever heard I had one was today   History of breast cancer 12/01/2012   Pt with h/o breast CA s/p lumpectomy with chemo/radiation in 1991. Pt mammogram 2011 was unremarkable. Had CT chest 10/2012 in ED for SOB and showed spiculated nodule with lymph node. 12/04/12: Birads 2; repeat diagnostic mammogram in 1 year.     Hyperlipidemia    Hypertension    Lower extremity edema 09/21/2018   Obesity    Personal history of chemotherapy    Personal history of radiation therapy    Pneumonia    couple times in the last 10-15 yrs (08/16/2018)   QT prolongation 08/08/2014   Seasonal allergies 02/25/2017   Shortness of breath 10/05/2011   at rest; lying down; w/exertion   Sigmoid diverticulitis 80/2008   Tobacco abuse    Type 2 diabetes mellitus (HCC) 05/14/2009   Type II diabetes mellitus (HCC)     Allergies Allergies  Allergen Reactions   Ace Inhibitors Anaphylaxis, Swelling and Other (See Comments)    Throat swelling  (Re: Lisinopril -hydrochlorothiazide )  Pt questioned if she is allergic in 2025.    IV Location/Drains/Wounds Patient Lines/Drains/Airways Status     Active Line/Drains/Airways     Name Placement date Placement time Site Days   Peripheral IV 08/04/24 20 G 1 Anterior;Right Forearm 08/04/24  0140  Forearm  less than 1             Labs/Imaging Results for orders placed or performed during the hospital encounter of 08/04/24 (from the past 48 hours)  Basic metabolic panel     Status: Abnormal   Collection Time: 08/04/24  1:41 AM  Result Value Ref Range   Sodium 141 135 - 145 mmol/L   Potassium 4.0 3.5 - 5.1 mmol/L   Chloride 107 98 - 111 mmol/L   CO2 20 (L) 22 - 32 mmol/L   Glucose, Bld 100 (H) 70 - 99 mg/dL    Comment: Glucose reference range applies only to samples taken after fasting for at least 8 hours.   BUN 9 8 - 23 mg/dL   Creatinine, Ser 9.28  0.44 - 1.00 mg/dL   Calcium  8.9 8.9 - 10.3 mg/dL   GFR, Estimated >39 >39 mL/min    Comment: (NOTE) Calculated using the CKD-EPI Creatinine Equation (2021)    Anion gap 14 5 - 15    Comment: Performed at [facility], [address], [place], [place] [address]  CBC     Status: Abnormal   Collection Time: 08/04/24  1:41 AM  Result Value Ref Range   WBC 5.8 4.0 - 10.5 K/uL   RBC 3.75 (L) 3.87 - 5.11 MIL/uL   Hemoglobin 10.8 (L) 12.0 - 15.0 g/dL   HCT 65.8 (L) 63.9 - 53.9 %   MCV 90.9 80.0 - 100.0 fL   MCH 28.8 26.0 - 34.0 pg   MCHC 31.7 30.0 - 36.0 g/dL   RDW 84.5 88.4 - 84.4 %   Platelets 304 150 - 400 K/uL   nRBC 0.0 0.0 - 0.2 %    Comment: Performed at [facility], [address] [place], [place] [address]  Resp panel by RT-PCR (RSV, Flu A&B, Covid) Anterior Nasal Swab     Status: None   Collection Time: 08/04/24  1:43 AM   Specimen: Anterior Nasal Swab  Result Value Ref Range   SARS Coronavirus 2 by RT PCR NEGATIVE NEGATIVE   Influenza A by PCR NEGATIVE NEGATIVE   Influenza B by PCR NEGATIVE NEGATIVE   Resp Syncytial Virus by PCR NEGATIVE NEGATIVE    Comment: Performed at [facility], [address], [place], [place] [address]   *Note: Due to a large number of results and/or encounters for the requested time period, some results have not been displayed. A complete set of results can be  found in Results Review.   DG Chest 2 View Result Date: 08/04/2024 EXAM: 2 VIEW(S) XRAY OF THE CHEST 08/04/2024 02:11:44 AM COMPARISON: 07/11/2024 CLINICAL HISTORY: Shortness of breath. Per chart - Patient c/o SOB x 1 hour. Patient report taking inhaler without relief. Patient wheezing at triage. Patient denies Chest pain. HX COPD, asthma FINDINGS: LUNGS AND PLEURA: Hyperinflation. No focal consolidation or pleural effusion. No pneumothorax. HEART AND MEDIASTINUM: Overlapping cardiac leads. No acute abnormality of the cardiac and mediastinal silhouettes. BONES AND SOFT TISSUES: Surgical clips in right axillary region. Degenerative changes of spine. Old right rib fractures. Fixation hardware along lower cervical spine. IMPRESSION: 1. Hyperinflation. No acute cardiopulmonary process. Electronically signed by: Norman Gatlin MD 08/04/2024 02:35 AM EDT RP Workstation: [id number]    Pending Labs Unresulted Labs (From admission, onward)     Start     Ordered   08/05/24 0500  Basic metabolic panel  Tomorrow morning,   R        08/04/24 0741   08/05/24 0500  CBC  Tomorrow morning,   R        08/04/24 0741            Vitals/Pain Today's Vitals   08/04/24 0611 08/04/24 0814 08/04/24 0819 08/04/24 0830  BP:    (!) 169/81  Pulse:    89  Resp:    19  Temp: 98.3 F (36.8 C)     TempSrc:      SpO2:  100%  100%  PainSc: 0-No pain  0-No pain     Isolation Precautions No active isolations  Medications Medications  predniSONE  (DELTASONE ) tablet 40 mg (40 mg Oral Given 08/04/24 0814)  ipratropium-albuterol  (DUONEB) 0.5-2.5 (3) MG/3ML nebulizer solution 3 mL (3 mLs Nebulization Given 08/04/24  9184)  amLODipine  (NORVASC ) tablet 10 mg (has no administration in time range)  metoprolol  tartrate (LOPRESSOR ) tablet 50 mg (has no administration in time range)  enoxaparin  (LOVENOX ) injection 40 mg (has no administration in time range)  acetaminophen  (TYLENOL ) tablet 650 mg (has no administration in time  range)    Or  acetaminophen  (TYLENOL ) suppository 650 mg (has no administration in time range)  traZODone  (DESYREL ) tablet 25 mg (has no administration in time range)  ondansetron  (ZOFRAN ) tablet 4 mg (has no administration in time range)    Or  ondansetron  (ZOFRAN ) injection 4 mg (has no administration in time range)  albuterol  (PROVENTIL ) (2.5 MG/3ML) 0.083% nebulizer solution 2.5 mg (2.5 mg Nebulization Given 08/04/24 0926)  losartan  (COZAAR ) tablet 50 mg (has no administration in time range)    And  hydrochlorothiazide  (HYDRODIURIL ) tablet 12.5 mg (has no administration in time range)  ipratropium-albuterol  (DUONEB) 0.5-2.5 (3) MG/3ML nebulizer solution 3 mL (3 mLs Nebulization Given 08/04/24 0209)  magnesium  sulfate IVPB 1 g 100 mL (0 g Intravenous Stopped 08/04/24 0220)  methylPREDNISolone  sodium succinate (SOLU-MEDROL ) 125 mg/2 mL injection 125 mg (125 mg Intravenous Given 08/04/24 0205)  albuterol  (PROVENTIL ) (2.5 MG/3ML) 0.083% nebulizer solution (10 mg/hr Nebulization Given 08/04/24 0352)  amLODipine  (NORVASC ) tablet 10 mg (10 mg Oral Given 08/04/24 0352)  losartan  (COZAAR ) tablet 50 mg (50 mg Oral Given 08/04/24 0352)  hydrochlorothiazide  (HYDRODIURIL ) tablet 12.5 mg (12.5 mg Oral Given 08/04/24 0352)  ipratropium-albuterol  (DUONEB) 0.5-2.5 (3) MG/3ML nebulizer solution 3 mL (3 mLs Nebulization Given 08/04/24 0609)    Mobility walks

## 2024-08-04 NOTE — Plan of Care (Signed)

## 2024-08-04 NOTE — TOC Initial Note (Signed)
 Transition of Care [facility]) - Initial/Assessment Note    Patient Details  Name: [name] MRN: [id number] Date of Birth: [birth year]  Transition of Care [facility]) CM/SW Contact:    Sonda Manuella Quill, RN Phone Number: 08/04/2024, 6:19 PM  Clinical Narrative:                 [name] w/ pt in room; pt said she lives at home w/ her spouse Alizon Schmeling [phone]); she plans to return at d/c; family will provide transportation; pt verified insurance/PCP; she denied SDOH risks; pt said she does not have difficulty paying for medication; her pharmacy is [facility] [address] [place]; pt said she has nebulizer; she does not have HH services, or home oxygen , IP CM is following.  Expected Discharge Plan: Home/Self Care Barriers to Discharge: Continued Medical Work up   Patient Goals and CMS Choice Patient states their goals for this hospitalization and ongoing recovery are:: home CMS Medicare.gov Compare Post Acute Care list provided to:: Patient        Expected Discharge Plan and Services   Discharge Planning Services: CM Consult   Living arrangements for the past 2 months: Single Family Home                 DME Arranged: N/A DME Agency: NA       HH Arranged: NA HH Agency: NA        Prior Living Arrangements/Services Living arrangements for the past 2 months: Single Family Home Lives with:: Spouse Patient language and need for interpreter reviewed:: Yes Do you feel safe going back to the place where you live?: Yes      Need for Family Participation in Patient Care: Yes (Comment) Care giver support system in place?: Yes (comment) Current home services: DME (nebulizer) Criminal Activity/Legal Involvement Pertinent to Current Situation/Hospitalization: No - Comment as needed  Activities of Daily Living      Permission Sought/Granted Permission sought to share information with : Case Manager Permission granted to share information with : Yes, Verbal Permission  Granted  Share Information with NAME: Case Manager     Permission granted to share info w Relationship: [name] (spouse) [phone]     Emotional Assessment Appearance:: Appears stated age Attitude/Demeanor/Rapport: Gracious Affect (typically observed): Accepting Orientation: : Oriented to Self, Oriented to Place, Oriented to  Time, Oriented to Situation Alcohol  / Substance Use: Not Applicable Psych Involvement: No (comment)  Admission diagnosis:  Hypoxia [R09.02] COPD exacerbation (HCC) [J44.1] COPD with acute exacerbation (HCC) [J44.1] Patient Active Problem List   Diagnosis Date Noted   Essential hypertension 05/25/2024   COPD with acute exacerbation (HCC) 05/14/2024   Hypertensive urgency 04/19/2024   COPD exacerbation (HCC) 04/18/2024   Memory changes 03/28/2023   Age-related osteoporosis without current pathological fracture  03/06/2021   Healthcare maintenance 03/06/2021   Chronic hip pain, right 06/22/2019   Diabetic neuropathy (HCC) 05/21/2019   Type 2 diabetes mellitus with complication, without long-term current use of insulin  (HCC) 12/26/2018   Chronic sinusitis    COPD (chronic obstructive pulmonary disease) (HCC) 12/24/2014   Acute hypoxic respiratory failure (HCC)    Dyslipidemia associated with type 2 diabetes mellitus (HCC) 06/14/2014   Breast nodule 12/01/2012   Primary osteoarthritis of right knee 09/29/2012   Anxiety 12/30/2009   Tobacco use disorder 04/06/2009   Hypertension associated with chronic kidney disease due to type 2 diabetes mellitus (HCC) 04/06/2009   PCP:  Elnora Ip, MD Pharmacy:   [facility] [phone] -  [place], [place] - [address] AT [address] [place] [address] Phone: [phone] Fax: [phone]  [facility] [address] [place] - [address] [place] [address] Phone: [phone] Fax: [phone]  [place] [facility] [address] [place] [address] Phone: [phone] Fax: [phone]     Social Drivers of Health (SDOH) Social History: SDOH Screenings   Food Insecurity: No Food Insecurity (08/04/2024)  Recent Concern: Food Insecurity - Food Insecurity Present (08/04/2024)  Housing: Low Risk  (08/04/2024)  Recent Concern: Housing - High Risk (08/04/2024)  Transportation Needs: No Transportation Needs (08/04/2024)  Utilities: Not At Risk (08/04/2024)  Alcohol  Screen: Low Risk  (12/13/2022)  Depression (PHQ2-9): Medium Risk (07/16/2024)  Financial Resource Strain: Low Risk  (07/19/2024)  Physical Activity: Inactive (05/28/2024)  Social Connections: Moderately Integrated (08/04/2024)  Stress: Stress Concern Present (06/12/2024)  Tobacco Use: High Risk (06/20/2024)   SDOH Interventions: Food Insecurity Interventions: Intervention Not Indicated, Inpatient TOC Housing Interventions: Intervention Not Indicated, Inpatient TOC Transportation Interventions: Intervention Not Indicated, Inpatient TOC Utilities Interventions: Intervention Not Indicated, Inpatient TOC   Readmission Risk Interventions    08/04/2024    6:17 PM 06/21/2024    1:08 PM 05/13/2024   12:43 PM  Readmission Risk Prevention Plan  Transportation Screening Complete Complete Complete  PCP or Specialist Appt within 5-7 Days   Complete  PCP or Specialist Appt within 3-5 Days Complete    Home Care Screening   Complete  Medication Review (RN CM)   Complete  HRI or Home Care Consult Complete Complete   Social Work Consult for Recovery Care Planning/Counseling Complete Complete   Palliative Care Screening Not Applicable Not Applicable   Medication Review Oceanographer) Complete Complete

## 2024-08-04 NOTE — Progress Notes (Signed)
 Received patient from ER. RN greeted patient, and went over plan of care, which includes meds and scheduled nebs for copd. Patient oriented to unit and room, explained fall risk precautions and call light use. Patient is currently on room air sating at 95%.

## 2024-08-04 NOTE — ED Notes (Signed)
 Pt waiting for completion of room to be cleaned.

## 2024-08-05 ENCOUNTER — Other Ambulatory Visit (HOSPITAL_COMMUNITY): Payer: Self-pay

## 2024-08-05 DIAGNOSIS — J441 Chronic obstructive pulmonary disease with (acute) exacerbation: Secondary | ICD-10-CM | POA: Diagnosis not present

## 2024-08-05 DIAGNOSIS — E66811 Obesity, class 1: Secondary | ICD-10-CM

## 2024-08-05 LAB — CBC
HCT: 35.1 % — ABNORMAL LOW (ref 36.0–46.0)
Hemoglobin: 10.7 g/dL — ABNORMAL LOW (ref 12.0–15.0)
MCH: 29 pg (ref 26.0–34.0)
MCHC: 30.5 g/dL (ref 30.0–36.0)
MCV: 95.1 fL (ref 80.0–100.0)
Platelets: 267 K/uL (ref 150–400)
RBC: 3.69 MIL/uL — ABNORMAL LOW (ref 3.87–5.11)
RDW: 15.3 % (ref 11.5–15.5)
WBC: 8 K/uL (ref 4.0–10.5)
nRBC: 0 % (ref 0.0–0.2)

## 2024-08-05 LAB — HEMOGLOBIN A1C
Hgb A1c MFr Bld: 5.7 % — ABNORMAL HIGH (ref 4.8–5.6)
Mean Plasma Glucose: 116.89 mg/dL

## 2024-08-05 LAB — BASIC METABOLIC PANEL WITH GFR
Anion gap: 12 (ref 5–15)
BUN: 16 mg/dL (ref 8–23)
CO2: 22 mmol/L (ref 22–32)
Calcium: 9 mg/dL (ref 8.9–10.3)
Chloride: 101 mmol/L (ref 98–111)
Creatinine, Ser: 0.74 mg/dL (ref 0.44–1.00)
GFR, Estimated: >60 mL/min (ref >60)
Glucose, Bld: 188 mg/dL — ABNORMAL HIGH (ref 70–99)
Potassium: 3.5 mmol/L (ref 3.5–5.1)
Sodium: 134 mmol/L — ABNORMAL LOW (ref 135–145)

## 2024-08-05 MED ORDER — IPRATROPIUM-ALBUTEROL 0.5-2.5 (3) MG/3ML IN SOLN
3.0000 mL | RESPIRATORY_TRACT | Status: DC
  Administered 2024-08-05 (×2): 3 mL via RESPIRATORY_TRACT
  Filled 2024-08-05 (×2): qty 3

## 2024-08-05 MED ORDER — PREDNISONE 20 MG PO TABS
40.0000 mg | ORAL_TABLET | Freq: Every day | ORAL | 0 refills | Status: AC
  Filled 2024-08-05: qty 8, 4d supply, fill #0

## 2024-08-05 MED ORDER — NICOTINE 14 MG/24HR TD PT24
14.0000 mg | MEDICATED_PATCH | TRANSDERMAL | 0 refills | Status: DC
  Filled 2024-08-05: qty 30, 30d supply, fill #0

## 2024-08-05 MED ORDER — GUAIFENESIN-DM 100-10 MG/5ML PO SYRP
5.0000 mL | ORAL_SOLUTION | ORAL | Status: DC | PRN
  Administered 2024-08-05 (×2): 5 mL via ORAL
  Filled 2024-08-05 (×2): qty 5

## 2024-08-05 NOTE — Discharge Summary (Signed)
 Physician Discharge Summary   Patient: [name] MRN: [id number] DOB: 1951/04/02  Admit date:     08/04/2024  Discharge date: 08/05/24  Discharge Physician: Delon Herald   PCP: Elnora Ip, MD   Recommendations at discharge:   STOP smoking!  Nicotine  patch is ordered Continue prednisone  daily for the next 4 days (starting 9/29) Change Arnuity Ellipta  to daily (rather than as needed) Continue Anoro Continue albuterol  inhaler/nebulizer as needed Follow up with Dr. Gomez-Caraballo in 1-2 weeks  Discharge Diagnoses: Principal Problem:   COPD with acute exacerbation (HCC) Active Problems:   Tobacco use disorder   Essential hypertension   Class 1 obesity due to excess calories with body mass index (BMI) of 30.0 to 30.9 in adult    Hospital Course: 73yo with h/o COPD with ongoing tobacco use, HLD, and depression who presented on 9/27 with SOB and wheezing. No hypoxia. Given nebs and steroids.  Assessment and Plan:  COPD exacerbation without respiratory failure Presented with 2 days of worsening shortness of breath, wheezing and increased nonproductive cough Expiratory wheezing and rhonchi on examination. Workup negative for evidence of viral or acute bacterial infection Overnight observation IV Solu-Medrol  -> prednisone   Scheduled DuoNebs and as needed albuterol  inhaler Incentive spirometer and flutter valve   Hypertension Patient was initially hypertensive due to not taking her home medications yesterday Blood pressure is improving Continue home amlodipine , Hyzaar and metoprolol    Tobacco use disorder Cessation encouraged Nicotine  patch ordered  Class 1 obesity Body mass index is 30.42 kg/m.SABRA  Weight loss should be encouraged Outpatient PCP/bariatric medicine f/u encouraged Significantly low or high BMI is associated with higher medical risk including morbidity and mortality      Consultants: None  Procedures: None  Antibiotics: None     Pain control - [place]  Controlled Substance Reporting System database was reviewed. and patient was instructed, not to drive, operate heavy machinery, perform activities at heights, swimming or participation in water activities or provide baby-sitting services while on Pain, Sleep and Anxiety Medications; until their outpatient Physician has advised to do so again. Also recommended to not to take more than prescribed Pain, Sleep and Anxiety Medications.   Disposition: Home Diet recommendation:  Carb modified diet DISCHARGE MEDICATION: Allergies as of 08/05/2024       Reactions   Ace Inhibitors Anaphylaxis, Swelling, Other (See Comments)   Throat swelling  (Re: Lisinopril -hydrochlorothiazide ) Pt questioned if she is allergic in 2025.        Medication List     STOP taking these medications    pantoprazole  40 MG tablet Commonly known as: PROTONIX    Varenicline  Tartrate (Starter) 0.5 MG X 11 & 1 MG X 42 Tbpk       TAKE these medications    acetaminophen  500 MG tablet Commonly known as: TYLENOL  Take 1,000 mg by mouth daily as needed for moderate pain (pain score 4-6) or mild pain (pain score 1-3).   albuterol  108 (90 Base) MCG/ACT inhaler Commonly known as: VENTOLIN  HFA Inhale 2 puffs into the lungs every 6 (six) hours as needed for wheezing or shortness of breath.   albuterol  (2.5 MG/3ML) 0.083% nebulizer solution Commonly known as: PROVENTIL  Take 3 mLs (2.5 mg total) by nebulization every 4 (four) hours as needed for wheezing or shortness of breath.   amLODipine  10 MG tablet Commonly known as: NORVASC  Take 1 tablet (10 mg total) by mouth daily.   Anoro Ellipta  62.5-25 MCG/ACT Aepb Generic drug: umeclidinium-vilanterol Inhale 1 puff into the lungs daily.  Arnuity Ellipta  100 MCG/ACT Aepb Generic drug: Fluticasone  Furoate Inhale 1 puff into the lungs daily at 8 pm. What changed:   when to take this reasons to take this   fluticasone  50 MCG/ACT nasal spray Commonly known as: FLONASE  Place 2 sprays into both nostrils daily. What changed:  when to take this reasons to take this   losartan -hydrochlorothiazide  50-12.5 MG tablet Commonly known as: HYZAAR Take 1 tablet by mouth 2 (two) times daily.   metoprolol  tartrate 50 MG tablet Commonly known as: LOPRESSOR  Take 1 tablet (50 mg total) by mouth 2 (two) times daily.   nicotine  14 mg/24hr patch Commonly known as: NICODERM CQ  - dosed in mg/24 hours Place 1 patch (14 mg total) onto the skin daily.   predniSONE  20 MG tablet Commonly known as: DELTASONE  Take 2 tablets (40 mg total) by mouth daily with breakfast for 4 days. Start taking on: August 06, 2024        Discharge Exam:   Subjective: Feeling ok today. Not on O2.  Still wheezing but this is improved.   Objective: Vitals:   08/05/24 0938 08/05/24 1201  BP: (!) 164/77   Pulse: 83   Resp: 18   Temp:    SpO2: 100% 95%    Intake/Output Summary (Last 24 hours) at 08/05/2024 1248 Last data filed at 08/04/2024 2000 Gross per 24 hour  Intake 480 ml  Output --  Net 480 ml   Filed Weights   08/04/24 2130  Weight: 85.5 kg    Exam:  General:  Appears calm and comfortable and is in NAD, on RA Eyes:  normal lids, iris ENT:  grossly normal hearing, lips & tongue, mmm Cardiovascular:  RRR. No LE edema.  Respiratory:   Expiratory wheezing with modest air movement.  Normal respiratory effort. Abdomen:  soft, NT, ND Skin:  no rash or induration seen on limited exam Musculoskeletal:  grossly normal tone BUE/BLE, good ROM, no bony abnormality Psychiatric: blunted mood and affect, speech fluent and appropriate, AOx3 Neurologic:  CN 2-12 grossly intact, moves all extremities in coordinated fashion, sensation intact  Data Reviewed: I have reviewed the patient's lab results since admission.  Pertinent labs for today include:   Na++ 134, not  clinically significant Glucose 188 WBC 8 Hgb 10.7, stable COVID/flu/RSV negative    Condition at discharge: stable  The results of significant diagnostics from this hospitalization (including imaging, microbiology, ancillary and laboratory) are listed below for reference.   Imaging Studies: DG Chest 2 View Result Date: 08/04/2024 EXAM: 2 VIEW(S) XRAY OF THE CHEST 08/04/2024 02:11:44 AM COMPARISON: 07/11/2024 CLINICAL HISTORY: Shortness of breath. Per chart - Patient c/o SOB x 1 hour. Patient report taking inhaler without relief. Patient wheezing at triage. Patient denies Chest pain. HX COPD, asthma FINDINGS: LUNGS AND PLEURA: Hyperinflation. No focal consolidation or pleural effusion. No pneumothorax. HEART AND MEDIASTINUM: Overlapping cardiac leads. No acute abnormality of the cardiac and mediastinal silhouettes. BONES AND SOFT TISSUES: Surgical clips in right axillary region. Degenerative changes of spine. Old right rib fractures. Fixation hardware along lower cervical spine. IMPRESSION: 1. Hyperinflation. No acute cardiopulmonary process. Electronically signed by: Norman Gatlin MD 08/04/2024 02:35 AM EDT RP Workstation: [id number]   DG Chest 2 View Result Date: 07/11/2024 CLINICAL DATA:  Shortness of breath and wheezing EXAM: CHEST - 2 VIEW COMPARISON:  Chest x-ray 06/20/2024. FINDINGS: The heart size and mediastinal contours are within normal limits. Both lungs are clear. Right axillary surgical clip present. Cervical spinal fusion plate again seen.  No acute fractures are identified. IMPRESSION: No active cardiopulmonary disease. Electronically Signed   By: Greig Pique M.D.   On: 07/11/2024 18:53    Microbiology: Results for orders placed or performed during the hospital encounter of 08/04/24  Resp panel by RT-PCR (RSV, Flu A&B, Covid) Anterior Nasal Swab     Status: None   Collection Time: 08/04/24  1:43 AM   Specimen: Anterior Nasal Swab  Result Value Ref Range Status   SARS  Coronavirus 2 by RT PCR NEGATIVE NEGATIVE Final   Influenza A by PCR NEGATIVE NEGATIVE Final   Influenza B by PCR NEGATIVE NEGATIVE Final   Resp Syncytial Virus by PCR NEGATIVE NEGATIVE Final    Comment: Performed at [facility], [address], [place], [place] [address]   *Note: Due to a large number of results and/or encounters for the requested time period, some results have not been displayed. A complete set of results can be found in Results Review.    Labs: CBC: Recent Labs  Lab 08/04/24 0141 08/05/24 0527  WBC 5.8 8.0  HGB 10.8* 10.7*  HCT 34.1* 35.1*  MCV 90.9 95.1  PLT 304 267   Basic Metabolic Panel: Recent Labs  Lab 08/04/24 0141 08/05/24 0527  NA 141 134*  K 4.0 3.5  CL 107 101  CO2 20* 22  GLUCOSE 100* 188*  BUN 9 16  CREATININE 0.71 0.74  CALCIUM  8.9 9.0   Liver Function Tests: No results for input(s): AST, ALT, ALKPHOS, BILITOT, PROT, ALBUMIN in the last 168 hours. CBG: No results for input(s): GLUCAP in the last 168 hours.  Discharge time spent: greater than 30 minutes.  Signed: Delon Herald, MD [facility] 08/05/2024

## 2024-08-05 NOTE — Plan of Care (Signed)

## 2024-08-05 NOTE — Care Management Obs Status (Signed)
 MEDICARE OBSERVATION STATUS NOTIFICATION   Patient Details  Name: NALAYAH HITT MRN: [id number] Date of Birth: 1951-04-05   Medicare Observation Status Notification Given:  Yes    Sonda Manuella Quill, RN 08/05/2024, 1:00 PM

## 2024-08-05 NOTE — Care Management CC44 (Signed)
 Condition Code 44 Documentation Completed  Patient Details  Name: [name] MRN: [id number] Date of Birth: October 25, 1951   Condition Code 44 given:  Yes Patient signature on Condition Code 44 notice:  Yes Documentation of 2 MD's agreement:  Yes Code 44 added to claim:  Yes    Sonda Manuella Quill, RN 08/05/2024, 1:00 PM

## 2024-08-05 NOTE — Hospital Course (Signed)
 73yo with h/o COPD with ongoing tobacco use, HLD, and depression who presented on 9/27 with SOB and wheezing. No hypoxia. Given nebs and steroids.

## 2024-08-05 NOTE — Care Management Obs Status (Signed)
 MEDICARE OBSERVATION STATUS NOTIFICATION   Patient Details  Name: [name] MRN: [id number] Date of Birth: [birth year]   Medicare Observation Status Notification Given:  Yes    Sonda Manuella Quill, RN 08/05/2024, 9:17 AM

## 2024-08-06 ENCOUNTER — Telehealth: Payer: Self-pay

## 2024-08-06 NOTE — Transitions of Care (Post Inpatient/ED Visit) (Signed)
   08/06/2024  Name: [name] MRN: [id number] DOB: 10-13-1951  Today's TOC FU Call Status: Today's TOC FU Call Status:: Unsuccessful Call (1st Attempt) Unsuccessful Call (1st Attempt) Date: 08/06/24  Attempted to reach the patient regarding the most recent Inpatient/ED visit.  Follow Up Plan: Additional outreach attempts will be made to reach the patient to complete the Transitions of Care (Post Inpatient/ED visit) call.   Signature Julian Lemmings, LPN [facility] Nurse Health Advisor Direct Dial [phone]

## 2024-08-07 ENCOUNTER — Other Ambulatory Visit

## 2024-08-07 NOTE — Transitions of Care (Post Inpatient/ED Visit) (Signed)
 Transition of Care week 2  Visit Note  08/07/2024  Name: [name] MRN: [id number]          DOB: Feb 15, 1951  Situation: Patient enrolled in [facility] 30-day program. Visit completed with Barnie Lady by telephone.   Background:   Past Medical History:  Diagnosis Date   Acute sinusitis 01/09/2019   Acute sinusitis 01/09/2019   Anxiety    Arthritis    fingers, knees (08/16/2018)   Asthma    Atopic dermatitis 03/20/2018   Axillary hidradenitis suppurativa 10/13/2018   Cancer of right breast (HCC) 1991   s/p lumpectomy, chemotherapy and radiation therapy in 1991. Mammogram in 2007 was normal.   Cellulitis of buttock, left 12/13/2022   Constipated    h/o   COPD (chronic obstructive pulmonary disease) (HCC)    History of multiple hospital admissions for exercabation    COPD exacerbation (HCC) 01/01/2019   COPD exacerbation (HCC) 02/17/2024   COPD with acute exacerbation (HCC) 01/14/2019   COPD with exacerbation (HCC) 04/06/2009   Qualifier: Diagnosis of  By: Loletta MD, Vijay     Depression    Diarrhea    h/o   Facial cellulitis 08/30/2022   Facial edema 08/31/2022   GERD (gastroesophageal reflux disease)    Grade I diastolic dysfunction 01/12/2024   Headache    a few times/month (08/16/2018   Heart murmur 10/05/2011   first time I ever heard I had one was today   History of breast cancer 12/01/2012   Pt with h/o breast CA s/p lumpectomy with chemo/radiation in 1991. Pt mammogram 2011 was unremarkable. Had CT chest 10/2012 in ED for SOB and showed spiculated nodule with lymph node. 12/04/12: Birads 2; repeat diagnostic mammogram in 1 year.     Hyperlipidemia    Hypertension    Lower extremity edema 09/21/2018   Obesity    Personal history of chemotherapy    Personal history of radiation therapy    Pneumonia    couple times in the last 10-15 yrs (08/16/2018)   QT prolongation 08/08/2014   Seasonal allergies 02/25/2017   Shortness of breath 10/05/2011    at rest; lying down; w/exertion   Sigmoid diverticulitis 80/2008   Tobacco abuse    Type 2 diabetes mellitus (HCC) 05/14/2009   Type II diabetes mellitus (HCC)     Assessment: Patient Reported Symptoms: Cognitive Cognitive Status: Alert and oriented to person, place, and time, Normal speech and language skills, Struggling with memory recall      Neurological Neurological Review of Symptoms: No symptoms reported    HEENT HEENT Symptoms Reported: No symptoms reported HEENT Comment: States she has allergies    Cardiovascular Cardiovascular Symptoms Reported: No symptoms reported Does patient have uncontrolled Hypertension?: Yes Is patient checking Blood Pressure at home?: No Cardiovascular Management Strategies: Medication therapy, Routine screening Cardiovascular Comment: The patient's systolic was 167 a couple of days ago in the ER. The patient can be inconsistent in taking her medication.  Respiratory Respiratory Symptoms Reported: Productive cough Additional Respiratory Details: The patient went to the ER on 08/04/24 due to COPD exacerbation. She was placed on Prednisone . She has been told to stop smoking. She cannot wear the Nicotine  patch because it makes her sick. She has smoked four cigarettes in the last 7 hours. She states she is trying to cut down Respiratory Management Strategies: Routine screening, Coping strategies, Medication therapy, Adequate rest  Endocrine Endocrine Symptoms Reported: No symptoms reported    Gastrointestinal Gastrointestinal Symptoms Reported: No symptoms reported  Genitourinary Genitourinary Symptoms Reported: No symptoms reported    Integumentary Integumentary Symptoms Reported: No symptoms reported    Musculoskeletal Musculoskelatal Symptoms Reviewed: Weakness, Limited mobility Additional Musculoskeletal Details: The patient sits most of the day Musculoskeletal Management Strategies: Adequate rest, Medical device, Activity       Psychosocial Psychosocial Symptoms Reported: No symptoms reported         There were no vitals filed for this visit.  Medications Reviewed Today     Reviewed by [name], RN (Case Manager) on 08/07/24 at 1508  Med List Status: <None>   Medication Order Taking? Sig Documenting Provider Last Dose Status Informant  acetaminophen  (TYLENOL ) 500 MG tablet [id number]  Take 1,000 mg by mouth daily as needed for moderate pain (pain score 4-6) or mild pain (pain score 1-3). [provider]  Active Self  albuterol  (PROVENTIL ) (2.5 MG/3ML) 0.083% nebulizer solution [id number]  Take 3 mLs (2.5 mg total) by nebulization every 4 (four) hours as needed for wheezing or shortness of breath. Gomez-Caraballo, Maria, MD  Active Self  albuterol  (VENTOLIN  HFA) 108 [id number] Base) MCG/ACT inhaler [id number]  Inhale 2 puffs into the lungs every 6 (six) hours as needed for wheezing or shortness of breath. Jolaine Pac, DO  Active Self  amLODipine  (NORVASC ) 10 MG tablet [id number]  Take 1 tablet (10 mg total) by mouth daily. Fernand Prost, MD  Active Self  fluticasone  (FLONASE ) 50 MCG/ACT nasal spray [id number]  Place 2 sprays into both nostrils daily.  Patient taking differently: Place 2 sprays into both nostrils daily as needed.   Sebastian Toribio GAILS, MD  Active Self  Fluticasone  Furoate (ARNUITY ELLIPTA ) 100 MCG/ACT AEPB [id number]  Inhale 1 puff into the lungs daily at 8 pm.  Patient taking differently: Inhale 1 puff into the lungs daily as needed.   Lue Elsie BROCKS, MD  Active Self  losartan -hydrochlorothiazide  (HYZAAR) 50-12.5 MG tablet [id number]  Take 1 tablet by mouth 2 (two) times daily. Gomez-Caraballo, Maria, MD  Active Self           Med Note (CRUTHIS, CHLOE C   Wed Jun 20, 2024 11:52 AM)    metoprolol  tartrate (LOPRESSOR ) 50 MG tablet [id number]  Take 1 tablet (50 mg total) by mouth 2 (two) times daily. Vann, Jessica U, DO  Active Self  nicotine  (NICODERM CQ  - DOSED IN MG/24 HOURS) 14  mg/24hr patch [id number]  Place 1 patch (14 mg total) onto the skin daily.  Patient not taking: Reported on 08/07/2024   Barbarann Nest, MD  Active   predniSONE  (DELTASONE ) 20 MG tablet [id number]  Take 2 tablets (40 mg total) by mouth daily with breakfast for 4 days. Barbarann Nest, MD  Active   umeclidinium-vilanterol (ANORO ELLIPTA ) 62.5-25 MCG/ACT AEPB [id number]  Inhale 1 puff into the lungs daily. Lue Elsie BROCKS, MD  Active Self            Recommendation:   PCP Follow-up Continue Current Plan of Care  Follow Up Plan:   Telephone follow-up in 1 week  Medford Moises, BSN, RN [place]  [facility] - [facility] RN Care Manager [phone]

## 2024-08-07 NOTE — Patient Instructions (Signed)
 Visit Information  Thank you for taking time to visit with me today. Please don't hesitate to contact me if I can be of assistance to you before our next scheduled telephone appointment.  Our next appointment is by telephone on Tuesday October 7th at 3:00pm  Following is a copy of your care plan:   Goals Addressed             This Visit's Progress    VBCI Transitions of Care (TOC) Care Plan       Problems: ( reviewed 08/07/24) Recent Hospitalization for treatment of COPD exacerbation related to allergies Medication access barrier The patient is on disability and states she cannot always afford her medication and Medication management barrier : The patient has a history of non-compliance Pharmacy referral has been placed on last admission but the patient did not answer the phone or call the pharmacist back.  PCP appointment scheduled for 07/03/24 - confirmed with the patient - The patient did not attend the appointment 07/16/24 [facility] or ED Adm Risk 59 % - Four admissions in the last 2 months 07/25/24 - The patient did a No Show for the Pulmonologist 07/23/24 08/07/24 - The patient went to the ED 08/04/24  Goal:  Over the next 30 days, the patient will not experience hospital readmission  Interventions: ( reviewed 08/07/24)  COPD Interventions:  Advised patient to track and manage COPD triggers Assessed social determinant of health barriers Discussed the importance of adequate rest and management of fatigue with COPD Provided instruction about proper use of medications used for management of COPD including inhalers Referral made to community resources care guide team for assistance with Pharmacy referral: Completed 04/25/24 - The pharmacy team has not connected with the patient. They have left messages and the patient has not returned calls.   Screening for signs and symptoms of depression related to chronic disease state  Educate the patient on the medications such as Claritin , Flonase ,  Prednisone  taper, nebulizer   and inhalers that aide in COPD triggers Smoking Cessation. The patient declines Nicotine  patches and will try the Nicotine  gum. She is trying to cut back on the number of cigarettes she smokes daily. She states the nicotine  patches make her feel nauseated. 07/16/24 - Update to respiratory medications - Anoro Ellipta  (umeclidinium-vilanterol) Start taking on: July 15, 2024 Arnuity Ellipta  (Fluticasone  Furoate) predniSONE  (DELTASONE ) 07/25/24 - The patient is smoking about 10 cigarettes a day  Patient Self Care Activities: ( reviewed 08/07/24) Attend all scheduled provider appointments Call pharmacy for medication refills 3-7 days in advance of running out of medications Call provider office for new concerns or questions  Notify RN Care Manager of [facility] call rescheduling needs Participate in Transition of Care Program/Attend [facility] scheduled calls Perform all self care activities independently  Take medications as prescribed     Plan:  The patient states she will be in [facility] the fist week of September and will not be available for an [employer] call. Next [facility] Outreach scheduled for Wednesday September 10th at 1:00pm - Hospitalized  Follow up phone call at the end of the week Wednesday September 24th at 3:00pm Follow up phone call October 7th at 3:00pm        Patient verbalizes understanding of instructions and care plan provided today and agrees to view in MyChart. Active MyChart status and patient understanding of how to access instructions and care plan via MyChart confirmed with patient.     The patient has been provided with  contact information for the care management team and has been advised to call with any health related questions or concerns.   Please call the care guide team at [phone] if you need to cancel or reschedule your appointment.   Please call the Suicide and Crisis Lifeline: 988 call the [place]  National Suicide Prevention  Lifeline: 873-433-4347 or TTY: (680) 491-8342 TTY 707-340-6166) to talk to a trained counselor if you are experiencing a Mental Health or Behavioral Health Crisis or need someone to talk to.  Medford Balboa, BSN, RN [place]  VBCI - [employer] [facility] RN Care Manager [phone]

## 2024-08-14 ENCOUNTER — Other Ambulatory Visit: Payer: Self-pay

## 2024-08-14 NOTE — Patient Instructions (Signed)
 Visit Information  Thank you for taking time to visit with me today. Please don't hesitate to contact me if I can be of assistance to you before our next scheduled telephone appointment.  Our next appointment is by telephone on Tuesday October 14th at 3:00pm  Following is a copy of your care plan:   Goals Addressed             This Visit's Progress    VBCI Transitions of Care (TOC) Care Plan       Problems: ( reviewed 08/14/24) Recent Hospitalization for treatment of COPD exacerbation related to allergies Medication access barrier The patient is on disability and states she cannot always afford her medication and Medication management barrier : The patient has a history of non-compliance Pharmacy referral has been placed on last admission but the patient did not answer the phone or call the pharmacist back.  PCP appointment scheduled for 07/03/24 - confirmed with the patient - The patient did not attend the appointment 07/16/24 [facility] or ED Adm Risk 59 % - Four admissions in the last 2 months 07/25/24 - The patient did a No Show for the Pulmonologist 07/23/24 08/07/24 - The patient went to the ED 08/04/24  Goal:  Over the next 30 days, the patient will not experience hospital readmission  Interventions:  ( reviewed 08/14/24)  COPD Interventions:  Advised patient to track and manage COPD triggers Assessed social determinant of health barriers Discussed the importance of adequate rest and management of fatigue with COPD Provided instruction about proper use of medications used for management of COPD including inhalers Referral made to community resources care guide team for assistance with Pharmacy referral: Completed 04/25/24 - The pharmacy team has not connected with the patient. They have left messages and the patient has not returned calls.   Screening for signs and symptoms of depression related to chronic disease state  Educate the patient on the medications such as Claritin ,  Flonase , Prednisone  taper, nebulizer   and inhalers that aide in COPD triggers Smoking Cessation. The patient declines Nicotine  patches and will try the Nicotine  gum. She is trying to cut back on the number of cigarettes she smokes daily. She states the nicotine  patches make her feel nauseated. 07/16/24 - Update to respiratory medications - Anoro Ellipta  (umeclidinium-vilanterol) Start taking on: July 15, 2024 Arnuity Ellipta  (Fluticasone  Furoate) predniSONE  (DELTASONE ) - Completed 07/25/24 - The patient is smoking about 10 cigarettes a day 08/14/24 - The patient states she is smoking about 6 cigarettes a day   Patient Self Care Activities:  ( reviewed 08/14/24) Attend all scheduled provider appointments Call pharmacy for medication refills 3-7 days in advance of running out of medications Call provider office for new concerns or questions  Notify RN Care Manager of [facility] call rescheduling needs Participate in Transition of Care Program/Attend [facility] scheduled calls Perform all self care activities independently  Take medications as prescribed     Plan:  The patient states she will be in [facility] the fist week of September and will not be available for an [employer] call. Next [facility] Outreach scheduled for Wednesday September 10th at 1:00pm - Hospitalized  Follow up phone call October 14th at 3:00pm        Patient verbalizes understanding of instructions and care plan provided today and agrees to view in MyChart. Active MyChart status and patient understanding of how to access instructions and care plan via MyChart confirmed with patient.     The patient has  been provided with contact information for the care management team and has been advised to call with any health related questions or concerns.   Please call the care guide team at [phone] if you need to cancel or reschedule your appointment.   Please call the Suicide and Crisis Lifeline: 988 call the [place]  National Suicide  Prevention Lifeline: 539 390 2573 or TTY: 8623072263 TTY 8625861516) to talk to a trained counselor if you are experiencing a Mental Health or Behavioral Health Crisis or need someone to talk to.  Medford Balboa, BSN, RN [place]  VBCI - [employer] [facility] RN Care Manager [phone]

## 2024-08-14 NOTE — Transitions of Care (Post Inpatient/ED Visit) (Signed)
 Transition of Care week 3  Visit Note  08/14/2024  Name: [name] MRN: [id number]          DOB: Feb 16, 1951  Situation: Patient enrolled in [facility] 30-day program. Visit completed with Barnie Lady by telephone.   Background:   Past Medical History:  Diagnosis Date   Acute sinusitis 01/09/2019   Acute sinusitis 01/09/2019   Anxiety    Arthritis    fingers, knees (08/16/2018)   Asthma    Atopic dermatitis 03/20/2018   Axillary hidradenitis suppurativa 10/13/2018   Cancer of right breast (HCC) 1991   s/p lumpectomy, chemotherapy and radiation therapy in 1991. Mammogram in 2007 was normal.   Cellulitis of buttock, left 12/13/2022   Constipated    h/o   COPD (chronic obstructive pulmonary disease) (HCC)    History of multiple hospital admissions for exercabation    COPD exacerbation (HCC) 01/01/2019   COPD exacerbation (HCC) 02/17/2024   COPD with acute exacerbation (HCC) 01/14/2019   COPD with exacerbation (HCC) 04/06/2009   Qualifier: Diagnosis of  By: Loletta MD, Vijay     Depression    Diarrhea    h/o   Facial cellulitis 08/30/2022   Facial edema 08/31/2022   GERD (gastroesophageal reflux disease)    Grade I diastolic dysfunction 01/12/2024   Headache    a few times/month (08/16/2018   Heart murmur 10/05/2011   first time I ever heard I had one was today   History of breast cancer 12/01/2012   Pt with h/o breast CA s/p lumpectomy with chemo/radiation in 1991. Pt mammogram 2011 was unremarkable. Had CT chest 10/2012 in ED for SOB and showed spiculated nodule with lymph node. 12/04/12: Birads 2; repeat diagnostic mammogram in 1 year.     Hyperlipidemia    Hypertension    Lower extremity edema 09/21/2018   Obesity    Personal history of chemotherapy    Personal history of radiation therapy    Pneumonia    couple times in the last 10-15 yrs (08/16/2018)   QT prolongation 08/08/2014   Seasonal allergies 02/25/2017   Shortness of breath 10/05/2011    at rest; lying down; w/exertion   Sigmoid diverticulitis 80/2008   Tobacco abuse    Type 2 diabetes mellitus (HCC) 05/14/2009   Type II diabetes mellitus (HCC)     Assessment: Patient Reported Symptoms: Cognitive Cognitive Status: Alert and oriented to person, place, and time, Struggling with memory recall, Normal speech and language skills      Neurological Neurological Review of Symptoms: No symptoms reported    HEENT HEENT Symptoms Reported: No symptoms reported      Cardiovascular Cardiovascular Symptoms Reported: No symptoms reported Does patient have uncontrolled Hypertension?: Yes Is patient checking Blood Pressure at home?: No Cardiovascular Management Strategies: Medication therapy, Routine screening, Coping strategies Cardiovascular Comment: The patient does not check her blood pressure at home  Respiratory Respiratory Symptoms Reported: Productive cough Additional Respiratory Details: The patient has finished her Prednisone . She has not made a follow up appointment with her PCP. She is still smoking about 6-7 cigarettes a day. She states she is going to go fishing today and that will distract her so she doesn't smoke Respiratory Management Strategies: Routine screening, Coping strategies, Medication therapy, Adequate rest  Endocrine Endocrine Symptoms Reported: No symptoms reported Is patient diabetic?: No    Gastrointestinal Gastrointestinal Symptoms Reported: No symptoms reported      Genitourinary Genitourinary Symptoms Reported: No symptoms reported    Integumentary Integumentary Symptoms Reported:  No symptoms reported    Musculoskeletal Musculoskelatal Symptoms Reviewed: Weakness, Limited mobility        Psychosocial Psychosocial Symptoms Reported: No symptoms reported         There were no vitals filed for this visit.  Medications Reviewed Today     Reviewed by [name], RN (Case Manager) on 08/14/24 at 1518  Med List Status: <None>    Medication Order Taking? Sig Documenting Provider Last Dose Status Informant  acetaminophen  (TYLENOL ) 500 MG tablet [id number] No Take 1,000 mg by mouth daily as needed for moderate pain (pain score 4-6) or mild pain (pain score 1-3). [provider] Unknown Active Self  albuterol  (PROVENTIL ) (2.5 MG/3ML) 0.083% nebulizer solution [id number] No Take 3 mLs (2.5 mg total) by nebulization every 4 (four) hours as needed for wheezing or shortness of breath. Gomez-Caraballo, Maria, MD 08/03/2024 Active Self  albuterol  (VENTOLIN  HFA) 108 (90 Base) MCG/ACT inhaler [id number] No Inhale 2 puffs into the lungs every 6 (six) hours as needed for wheezing or shortness of breath. Jolaine Pac, DO Unknown Active Self  amLODipine  (NORVASC ) 10 MG tablet [id number] No Take 1 tablet (10 mg total) by mouth daily. Fernand Prost, MD 08/03/2024 Active Self  fluticasone  (FLONASE ) 50 MCG/ACT nasal spray [id number] No Place 2 sprays into both nostrils daily.  Patient taking differently: Place 2 sprays into both nostrils daily as needed.   Sebastian Toribio GAILS, MD Unknown Active Self  Fluticasone  Furoate (ARNUITY ELLIPTA ) 100 MCG/ACT AEPB [id number] No Inhale 1 puff into the lungs daily at 8 pm.  Patient taking differently: Inhale 1 puff into the lungs daily as needed.   Lue Elsie BROCKS, MD Unknown Active Self  losartan -hydrochlorothiazide  (HYZAAR) 50-12.5 MG tablet [id number] No Take 1 tablet by mouth 2 (two) times daily. Gomez-Caraballo, Maria, MD 08/03/2024 Active Self           Med Note (CRUTHIS, CHLOE C   Wed Jun 20, 2024 11:52 AM)    metoprolol  tartrate (LOPRESSOR ) 50 MG tablet [id number] No Take 1 tablet (50 mg total) by mouth 2 (two) times daily. Vann, Jessica U, DO 08/03/2024 Active Self  nicotine  (NICODERM CQ  - DOSED IN MG/24 HOURS) 14 mg/24hr patch [id number]  Place 1 patch (14 mg total) onto the skin daily.  Patient not taking: Reported on 08/07/2024   Barbarann Nest, MD  Active   umeclidinium-vilanterol  (ANORO ELLIPTA ) 62.5-25 MCG/ACT AEPB [id number] No Inhale 1 puff into the lungs daily. Lue Elsie BROCKS, MD Unknown Active Self            Recommendation:   PCP Follow-up Continue Current Plan of Care  Follow Up Plan:   Telephone follow-up in 1 week  Medford Moises, BSN, RN [place]  VBCI - [facility] Health RN Care Manager [phone]

## 2024-08-15 NOTE — Transitions of Care (Post Inpatient/ED Visit) (Signed)
   08/15/2024  Name: [name] MRN: [id number] DOB: 1951-02-07  Today's TOC FU Call Status: Today's TOC FU Call Status:: Unsuccessful Call (1st Attempt) Unsuccessful Call (1st Attempt) Date: 08/06/24  Attempted to reach the patient regarding the most recent Inpatient/ED visit.  Follow Up Plan: No further outreach attempts will be made at this time. We have been unable to contact the patient. VBCI pt Signature Julian Lemmings, LPN [facility] Nurse Health Advisor Direct Dial [phone]

## 2024-08-21 ENCOUNTER — Other Ambulatory Visit: Payer: Self-pay

## 2024-08-21 NOTE — Transitions of Care (Post Inpatient/ED Visit) (Signed)
 Transition of Care week 4  Visit Note  08/21/2024  Name: [name] MRN: [id number]          DOB: Jan 01, 1951  Situation: Patient enrolled in [facility] 30-day program. Visit completed with Barnie Lady by telephone.   Background:   Initial Transition Care Management Follow-up Telephone Call Discharge Date and Diagnosis: Discharged 08/04/24 with COPD Exacerbation  Past Medical History:  Diagnosis Date   Acute sinusitis 01/09/2019   Acute sinusitis 01/09/2019   Anxiety    Arthritis    fingers, knees (08/16/2018)   Asthma    Atopic dermatitis 03/20/2018   Axillary hidradenitis suppurativa 10/13/2018   Cancer of right breast (HCC) 1991   s/p lumpectomy, chemotherapy and radiation therapy in 1991. Mammogram in 2007 was normal.   Cellulitis of buttock, left 12/13/2022   Constipated    h/o   COPD (chronic obstructive pulmonary disease) (HCC)    History of multiple hospital admissions for exercabation    COPD exacerbation (HCC) 01/01/2019   COPD exacerbation (HCC) 02/17/2024   COPD with acute exacerbation (HCC) 01/14/2019   COPD with exacerbation (HCC) 04/06/2009   Qualifier: Diagnosis of  By: Loletta MD, Vijay     Depression    Diarrhea    h/o   Facial cellulitis 08/30/2022   Facial edema 08/31/2022   GERD (gastroesophageal reflux disease)    Grade I diastolic dysfunction 01/12/2024   Headache    a few times/month (08/16/2018   Heart murmur 10/05/2011   first time I ever heard I had one was today   History of breast cancer 12/01/2012   Pt with h/o breast CA s/p lumpectomy with chemo/radiation in 1991. Pt mammogram 2011 was unremarkable. Had CT chest 10/2012 in ED for SOB and showed spiculated nodule with lymph node. 12/04/12: Birads 2; repeat diagnostic mammogram in 1 year.     Hyperlipidemia    Hypertension    Lower extremity edema 09/21/2018   Obesity    Personal history of chemotherapy    Personal history of radiation therapy    Pneumonia    couple times  in the last 10-15 yrs (08/16/2018)   QT prolongation 08/08/2014   Seasonal allergies 02/25/2017   Shortness of breath 10/05/2011   at rest; lying down; w/exertion   Sigmoid diverticulitis 80/2008   Tobacco abuse    Type 2 diabetes mellitus (HCC) 05/14/2009   Type II diabetes mellitus (HCC)     Assessment: Patient Reported Symptoms: Cognitive Cognitive Status: Alert and oriented to person, place, and time, Normal speech and language skills, Struggling with memory recall      Neurological Neurological Review of Symptoms: No symptoms reported    HEENT HEENT Symptoms Reported: No symptoms reported      Cardiovascular Cardiovascular Symptoms Reported: No symptoms reported Does patient have uncontrolled Hypertension?: Yes Is patient checking Blood Pressure at home?: No Cardiovascular Management Strategies: Medication therapy, Routine screening, Coping strategies Cardiovascular Comment: The patient is not checking her blood pressure  Respiratory Respiratory Symptoms Reported: Productive cough Additional Respiratory Details: The patient continues smoke 6-10 cigarettes a day. She has a wet cough that is sometimes productive. She states she is taking her inhalers and doing her nebulizer treatments Respiratory Management Strategies: Routine screening, Medication therapy, Activity, Adequate rest  Endocrine Endocrine Symptoms Reported: No symptoms reported Is patient diabetic?: No    Gastrointestinal Gastrointestinal Symptoms Reported: No symptoms reported      Genitourinary Genitourinary Symptoms Reported: No symptoms reported    Integumentary Integumentary Symptoms Reported: No  symptoms reported    Musculoskeletal Musculoskelatal Symptoms Reviewed: No symptoms reported Additional Musculoskeletal Details: Sedentary Lifestyle Musculoskeletal Management Strategies: Adequate rest, Activity Musculoskeletal Comment: The patient states she was busy all weekend with company because it was  Homecoming and her family was in town. Her daughter is visiting today and now she is ready for a nap.      Psychosocial Psychosocial Symptoms Reported: No symptoms reported Additional Psychological Details: In a better mood today due to seeing her family over the weekend         There were no vitals filed for this visit.  Medications Reviewed Today     Reviewed by [name], RN (Case Manager) on 08/21/24 at 1524  Med List Status: <None>   Medication Order Taking? Sig Documenting Provider Last Dose Status Informant  acetaminophen  (TYLENOL ) 500 MG tablet [id number] No Take 1,000 mg by mouth daily as needed for moderate pain (pain score 4-6) or mild pain (pain score 1-3). [provider] Unknown Active Self  albuterol  (PROVENTIL ) (2.5 MG/3ML) 0.083% nebulizer solution [id number] No Take 3 mLs (2.5 mg total) by nebulization every 4 (four) hours as needed for wheezing or shortness of breath. Gomez-Caraballo, Maria, MD 08/03/2024 Active Self  albuterol  (VENTOLIN  HFA) 108 (90 Base) MCG/ACT inhaler [id number] No Inhale 2 puffs into the lungs every 6 (six) hours as needed for wheezing or shortness of breath. Jolaine Pac, DO Unknown Active Self  amLODipine  (NORVASC ) 10 MG tablet [id number] No Take 1 tablet (10 mg total) by mouth daily. Fernand Prost, MD 08/03/2024 Active Self  fluticasone  (FLONASE ) 50 MCG/ACT nasal spray [id number] No Place 2 sprays into both nostrils daily.  Patient taking differently: Place 2 sprays into both nostrils daily as needed.   Sebastian Toribio GAILS, MD Unknown Active Self  Fluticasone  Furoate (ARNUITY ELLIPTA ) 100 MCG/ACT AEPB [id number] No Inhale 1 puff into the lungs daily at 8 pm.  Patient taking differently: Inhale 1 puff into the lungs daily as needed.   Lue Elsie BROCKS, MD Unknown Active Self  losartan -hydrochlorothiazide  (HYZAAR) 50-12.5 MG tablet [id number] No Take 1 tablet by mouth 2 (two) times daily. Gomez-Caraballo, Maria, MD 08/03/2024 Active  Self           Med Note (CRUTHIS, CHLOE C   Wed Jun 20, 2024 11:52 AM)    metoprolol  tartrate (LOPRESSOR ) 50 MG tablet [id number] No Take 1 tablet (50 mg total) by mouth 2 (two) times daily. Vann, Jessica U, DO 08/03/2024 Active Self  nicotine  (NICODERM CQ  - DOSED IN MG/24 HOURS) 14 mg/24hr patch [id number]  Place 1 patch (14 mg total) onto the skin daily.  Patient not taking: Reported on 08/07/2024   Barbarann Nest, MD  Active   umeclidinium-vilanterol (ANORO ELLIPTA ) 62.5-25 MCG/ACT AEPB [id number] No Inhale 1 puff into the lungs daily. Lue Elsie BROCKS, MD Unknown Active Self            Recommendation:   Continue Current Plan of Care  Follow Up Plan:   Telephone follow-up in 1 week  Medford Moises, BSN, RN [place]  VBCI - [facility] Health RN Care Manager [phone]

## 2024-08-21 NOTE — Patient Instructions (Signed)
 Visit Information  Thank you for taking time to visit with me today. Please don't hesitate to contact me if I can be of assistance to you before our next scheduled telephone appointment.  Our next appointment is by telephone on Wednesday October 22nd at 3:00pm  Following is a copy of your care plan:   Goals Addressed             This Visit's Progress    VBCI Transitions of Care (TOC) Care Plan       Problems: ( reviewed 08/21/24) Recent Hospitalization for treatment of COPD exacerbation related to allergies Medication access barrier The patient is on disability and states she cannot always afford her medication and Medication management barrier : The patient has a history of non-compliance Pharmacy referral has been placed on last admission but the patient did not answer the phone or call the pharmacist back.  PCP appointment scheduled for 07/03/24 - confirmed with the patient - The patient did not attend the appointment 07/16/24 [facility] or ED Adm Risk 59 % - Four admissions in the last 2 months 07/25/24 - The patient did a No Show for the Pulmonologist 07/23/24 08/07/24 - The patient went to the ED 08/04/24  Goal:  Over the next 30 days, the patient will not experience hospital readmission  Interventions:  ( reviewed 08/21/24)  COPD Interventions:  Advised patient to track and manage COPD triggers Assessed social determinant of health barriers Discussed the importance of adequate rest and management of fatigue with COPD Provided instruction about proper use of medications used for management of COPD including inhalers Referral made to community resources care guide team for assistance with Pharmacy referral: Completed 04/25/24 - The pharmacy team has not connected with the patient. They have left messages and the patient has not returned calls.   Screening for signs and symptoms of depression related to chronic disease state  Educate the patient on the medications such as Claritin ,  Flonase , Prednisone  taper, nebulizer   and inhalers that aide in COPD triggers Smoking Cessation. The patient declines Nicotine  patches and will try the Nicotine  gum. She is trying to cut back on the number of cigarettes she smokes daily. She states the nicotine  patches make her feel nauseated. 07/16/24 - Update to respiratory medications - Anoro Ellipta  (umeclidinium-vilanterol) Start taking on: July 15, 2024 Arnuity Ellipta  (Fluticasone  Furoate) predniSONE  (DELTASONE ) - Completed 07/25/24 - The patient is smoking about 10 cigarettes a day 08/14/24 - The patient states she is smoking about 6 cigarettes a day   Patient Self Care Activities:  ( reviewed 08/21/24) Attend all scheduled provider appointments Call pharmacy for medication refills 3-7 days in advance of running out of medications Call provider office for new concerns or questions  Notify RN Care Manager of [facility] call rescheduling needs Participate in Transition of Care Program/Attend [facility] scheduled calls Perform all self care activities independently  Take medications as prescribed     Plan:  The patient states she will be in [facility] the fist week of September and will not be available for an [employer] call. Next [facility] Outreach scheduled for Wednesday September 10th at 1:00pm - Hospitalized  Follow up phone call October 22nd at 3:00pm        Patient verbalizes understanding of instructions and care plan provided today and agrees to view in MyChart. Active MyChart status and patient understanding of how to access instructions and care plan via MyChart confirmed with patient.     The patient has  been provided with contact information for the care management team and has been advised to call with any health related questions or concerns.   Please call the care guide team at [phone] if you need to cancel or reschedule your appointment.   Please call the Suicide and Crisis Lifeline: 988 call the [place]  National Suicide  Prevention Lifeline: (236)258-2069 or TTY: (956)206-9565 TTY (952)451-3333) to talk to a trained counselor if you are experiencing a Mental Health or Behavioral Health Crisis or need someone to talk to.  Medford Balboa, BSN, RN [place]  VBCI - [employer] [facility] RN Care Manager [phone]

## 2024-08-24 ENCOUNTER — Telehealth: Payer: Self-pay

## 2024-08-24 NOTE — Telephone Encounter (Signed)
 Copied from CRM #[id number]. Topic: Clinical - Prescription Issue >> Aug 24, 2024 10:56 AM [name] wrote: Reason for CRM: patient states she picked up a nebulizer prescription from the Pharmacy and they told her she needs a prescription from the provider if she wants the solution with the steroid solution and now she is panicking cause she has the albuterol  nebulizer solution without the steroids,  patient would like to ask if it's ok to use this solution they gave her but she is worried the solution wont work if she gets a cold and will end up in the hospital this formula wont work as well as but the bromide nebulizer solution she uses always works well the patient is wanting a confirmation cause she has already paid the pharmacy for the wrong medication or a new prescription for the correct solution  Pt num [phone] (M) ok to leave a detailed message   [id number] [place], [place] - [address] AT [address] [place] [address] Phone: [phone] Fax: [phone]

## 2024-08-27 ENCOUNTER — Telehealth: Payer: Self-pay | Admitting: *Deleted

## 2024-08-27 NOTE — Telephone Encounter (Signed)
 Copied from CRM #[id number]. Topic: Clinical - Prescription Issue >> Aug 24, 2024 10:56 AM DeAngela L wrote: Reason for CRM: patient states she picked up a nebulizer prescription from the Pharmacy and they told her she needs a prescription from the provider if she wants the solution with the steroid solution and now she is panicking cause she has the albuterol  nebulizer solution without the steroids,  patient would like to ask if it's ok to use this solution they gave her but she is worried the solution wont work if she gets a cold and will end up in the hospital this formula wont work as well as but the bromide nebulizer solution she uses always works well the patient is wanting a confirmation cause she has already paid the pharmacy for the wrong medication or a new prescription for the correct solution  Pt num [phone] (M) ok to leave a detailed message   [id number] [place] [place], [place] - [address] AT [address] [place] [address] Phone: [phone] Fax: [phone] >> Aug 27, 2024 12:33 PM Antonio H wrote: Patient is calling back to follow up on nebulizer solution with the steroids in it. Informed her the office is on lunch. Patient says this is urgent and requesting a call back regarding this, call back number is [phone]

## 2024-08-29 ENCOUNTER — Other Ambulatory Visit: Payer: Self-pay

## 2024-08-30 ENCOUNTER — Other Ambulatory Visit: Payer: Self-pay

## 2024-08-30 ENCOUNTER — Inpatient Hospital Stay (HOSPITAL_COMMUNITY)
Admission: EM | Admit: 2024-08-30 | Discharge: 2024-09-03 | DRG: 189 | Disposition: A | Discharge status: Home / Self Care | Attending: Internal Medicine | Admitting: Internal Medicine

## 2024-08-30 ENCOUNTER — Emergency Department (HOSPITAL_COMMUNITY)

## 2024-08-30 ENCOUNTER — Encounter (HOSPITAL_COMMUNITY): Payer: Self-pay | Admitting: Internal Medicine

## 2024-08-30 DIAGNOSIS — N179 Acute kidney failure, unspecified: Secondary | ICD-10-CM | POA: Diagnosis present

## 2024-08-30 DIAGNOSIS — Z1152 Encounter for screening for COVID-19: Secondary | ICD-10-CM | POA: Diagnosis not present

## 2024-08-30 DIAGNOSIS — Z91148 Patient's other noncompliance with medication regimen for other reason: Secondary | ICD-10-CM

## 2024-08-30 DIAGNOSIS — F17210 Nicotine dependence, cigarettes, uncomplicated: Secondary | ICD-10-CM | POA: Diagnosis present

## 2024-08-30 DIAGNOSIS — I2489 Other forms of acute ischemic heart disease: Secondary | ICD-10-CM | POA: Diagnosis present

## 2024-08-30 DIAGNOSIS — E860 Dehydration: Secondary | ICD-10-CM | POA: Diagnosis present

## 2024-08-30 DIAGNOSIS — N189 Chronic kidney disease, unspecified: Secondary | ICD-10-CM | POA: Diagnosis present

## 2024-08-30 DIAGNOSIS — Z8701 Personal history of pneumonia (recurrent): Secondary | ICD-10-CM

## 2024-08-30 DIAGNOSIS — R0602 Shortness of breath: Secondary | ICD-10-CM | POA: Diagnosis not present

## 2024-08-30 DIAGNOSIS — I493 Ventricular premature depolarization: Secondary | ICD-10-CM | POA: Diagnosis present

## 2024-08-30 DIAGNOSIS — E785 Hyperlipidemia, unspecified: Secondary | ICD-10-CM | POA: Diagnosis present

## 2024-08-30 DIAGNOSIS — R0603 Acute respiratory distress: Secondary | ICD-10-CM | POA: Diagnosis not present

## 2024-08-30 DIAGNOSIS — Z90710 Acquired absence of both cervix and uterus: Secondary | ICD-10-CM

## 2024-08-30 DIAGNOSIS — J9601 Acute respiratory failure with hypoxia: Principal | ICD-10-CM | POA: Diagnosis present

## 2024-08-30 DIAGNOSIS — T465X6A Underdosing of other antihypertensive drugs, initial encounter: Secondary | ICD-10-CM | POA: Diagnosis present

## 2024-08-30 DIAGNOSIS — J439 Emphysema, unspecified: Secondary | ICD-10-CM | POA: Diagnosis present

## 2024-08-30 DIAGNOSIS — Z803 Family history of malignant neoplasm of breast: Secondary | ICD-10-CM

## 2024-08-30 DIAGNOSIS — D649 Anemia, unspecified: Secondary | ICD-10-CM | POA: Diagnosis present

## 2024-08-30 DIAGNOSIS — Z23 Encounter for immunization: Secondary | ICD-10-CM | POA: Diagnosis not present

## 2024-08-30 DIAGNOSIS — I700 Atherosclerosis of aorta: Secondary | ICD-10-CM | POA: Diagnosis not present

## 2024-08-30 DIAGNOSIS — E1122 Type 2 diabetes mellitus with diabetic chronic kidney disease: Secondary | ICD-10-CM | POA: Diagnosis present

## 2024-08-30 DIAGNOSIS — J441 Chronic obstructive pulmonary disease with (acute) exacerbation: Secondary | ICD-10-CM | POA: Diagnosis not present

## 2024-08-30 DIAGNOSIS — Z9221 Personal history of antineoplastic chemotherapy: Secondary | ICD-10-CM

## 2024-08-30 DIAGNOSIS — Z923 Personal history of irradiation: Secondary | ICD-10-CM

## 2024-08-30 DIAGNOSIS — Z853 Personal history of malignant neoplasm of breast: Secondary | ICD-10-CM

## 2024-08-30 DIAGNOSIS — I771 Stricture of artery: Secondary | ICD-10-CM | POA: Diagnosis not present

## 2024-08-30 DIAGNOSIS — I129 Hypertensive chronic kidney disease with stage 1 through stage 4 chronic kidney disease, or unspecified chronic kidney disease: Secondary | ICD-10-CM | POA: Diagnosis present

## 2024-08-30 DIAGNOSIS — Z981 Arthrodesis status: Secondary | ICD-10-CM | POA: Diagnosis not present

## 2024-08-30 LAB — BASIC METABOLIC PANEL WITH GFR
Anion gap: 13 (ref 5–15)
BUN: 10 mg/dL (ref 8–23)
CO2: 26 mmol/L (ref 22–32)
Calcium: 7.8 mg/dL — ABNORMAL LOW (ref 8.9–10.3)
Chloride: 102 mmol/L (ref 98–111)
Creatinine, Ser: 1.27 mg/dL — ABNORMAL HIGH (ref 0.44–1.00)
GFR, Estimated: 44 mL/min — ABNORMAL LOW (ref >60)
Glucose, Bld: 116 mg/dL — ABNORMAL HIGH (ref 70–99)
Potassium: 3.6 mmol/L (ref 3.5–5.1)
Sodium: 141 mmol/L (ref 135–145)

## 2024-08-30 LAB — CBC
HCT: 39.5 % (ref 36.0–46.0)
Hemoglobin: 12.1 g/dL (ref 12.0–15.0)
MCH: 27.2 pg (ref 26.0–34.0)
MCHC: 30.6 g/dL (ref 30.0–36.0)
MCV: 88.8 fL (ref 80.0–100.0)
Platelets: 304 K/uL (ref 150–400)
RBC: 4.45 MIL/uL (ref 3.87–5.11)
RDW: 15.4 % (ref 11.5–15.5)
WBC: 5.1 K/uL (ref 4.0–10.5)
nRBC: 0 % (ref 0.0–0.2)

## 2024-08-30 LAB — RESP PANEL BY RT-PCR (RSV, FLU A&B, COVID)  RVPGX2
Influenza A by PCR: NEGATIVE
Influenza B by PCR: NEGATIVE
Resp Syncytial Virus by PCR: NEGATIVE
SARS Coronavirus 2 by RT PCR: NEGATIVE

## 2024-08-30 LAB — TROPONIN T, HIGH SENSITIVITY
Troponin T High Sensitivity: 41 ng/L — ABNORMAL HIGH (ref 0–19)
Troponin T High Sensitivity: 32 ng/L — ABNORMAL HIGH (ref 0–19)

## 2024-08-30 LAB — MAGNESIUM: Magnesium: 1.8 mg/dL (ref 1.7–2.4)

## 2024-08-30 MED ORDER — ENOXAPARIN SODIUM 40 MG/0.4ML IJ SOSY
40.0000 mg | PREFILLED_SYRINGE | Freq: Every day | INTRAMUSCULAR | Status: DC
  Administered 2024-08-30 – 2024-09-02 (×4): 40 mg via SUBCUTANEOUS
  Filled 2024-08-30 (×5): qty 0.4

## 2024-08-30 MED ORDER — MAGNESIUM SULFATE 2 GM/50ML IV SOLN
2.0000 g | Freq: Once | INTRAVENOUS | Status: AC
  Administered 2024-08-30: 2 g via INTRAVENOUS
  Filled 2024-08-30: qty 50

## 2024-08-30 MED ORDER — ONDANSETRON HCL 4 MG PO TABS: 4.0000 mg | ORAL_TABLET | Freq: Four times a day (QID) | ORAL | Status: DC | PRN

## 2024-08-30 MED ORDER — NICOTINE 14 MG/24HR TD PT24
14.0000 mg | MEDICATED_PATCH | Freq: Every day | TRANSDERMAL | Status: DC
  Filled 2024-08-30 (×3): qty 1

## 2024-08-30 MED ORDER — METHYLPREDNISOLONE SODIUM SUCC 40 MG IJ SOLR
40.0000 mg | Freq: Two times a day (BID) | INTRAMUSCULAR | Status: DC
  Administered 2024-08-30 – 2024-09-03 (×8): 40 mg via INTRAVENOUS
  Filled 2024-08-30 (×8): qty 1

## 2024-08-30 MED ORDER — ONDANSETRON HCL 4 MG/2ML IJ SOLN
4.0000 mg | Freq: Four times a day (QID) | INTRAMUSCULAR | Status: DC | PRN
  Administered 2024-09-01: 4 mg via INTRAVENOUS
  Filled 2024-08-30: qty 2

## 2024-08-30 MED ORDER — METOPROLOL TARTRATE 50 MG PO TABS
50.0000 mg | ORAL_TABLET | Freq: Two times a day (BID) | ORAL | Status: DC
  Administered 2024-08-30 – 2024-09-03 (×9): 50 mg via ORAL
  Filled 2024-08-30: qty 2
  Filled 2024-08-30 (×8): qty 1

## 2024-08-30 MED ORDER — INFLUENZA VAC SPLIT HIGH-DOSE 0.5 ML IM SUSY
0.5000 mL | PREFILLED_SYRINGE | INTRAMUSCULAR | Status: AC
  Administered 2024-08-31: 0.5 mL via INTRAMUSCULAR
  Filled 2024-08-30: qty 0.5

## 2024-08-30 MED ORDER — ACETAMINOPHEN 650 MG RE SUPP: 650.0000 mg | Freq: Four times a day (QID) | RECTAL | Status: DC | PRN

## 2024-08-30 MED ORDER — SODIUM CHLORIDE 0.9 % IV BOLUS
1000.0000 mL | Freq: Once | INTRAVENOUS | Status: AC
  Administered 2024-08-30: 1000 mL via INTRAVENOUS

## 2024-08-30 MED ORDER — IPRATROPIUM-ALBUTEROL 0.5-2.5 (3) MG/3ML IN SOLN: 3.0000 mL | Freq: Once | RESPIRATORY_TRACT | Status: DC

## 2024-08-30 MED ORDER — IPRATROPIUM-ALBUTEROL 0.5-2.5 (3) MG/3ML IN SOLN
3.0000 mL | Freq: Once | RESPIRATORY_TRACT | Status: AC
  Administered 2024-08-30: 3 mL via RESPIRATORY_TRACT
  Filled 2024-08-30: qty 3

## 2024-08-30 MED ORDER — ALBUTEROL SULFATE (2.5 MG/3ML) 0.083% IN NEBU
10.0000 mg | INHALATION_SOLUTION | Freq: Once | RESPIRATORY_TRACT | Status: AC
  Administered 2024-08-30: 10 mg via RESPIRATORY_TRACT
  Filled 2024-08-30: qty 12

## 2024-08-30 MED ORDER — ACETAMINOPHEN 325 MG PO TABS
650.0000 mg | ORAL_TABLET | Freq: Four times a day (QID) | ORAL | Status: DC | PRN
  Administered 2024-09-01: 650 mg via ORAL
  Filled 2024-08-30: qty 2

## 2024-08-30 MED ORDER — TRAZODONE HCL 50 MG PO TABS
25.0000 mg | ORAL_TABLET | Freq: Every evening | ORAL | Status: DC | PRN
  Administered 2024-08-31 – 2024-09-02 (×3): 25 mg via ORAL
  Filled 2024-08-30 (×3): qty 1

## 2024-08-30 MED ORDER — SODIUM CHLORIDE 0.9 % IV SOLN: INTRAVENOUS | Status: AC

## 2024-08-30 MED ORDER — ASPIRIN 81 MG PO CHEW
324.0000 mg | CHEWABLE_TABLET | Freq: Once | ORAL | Status: AC
  Administered 2024-08-30: 324 mg via ORAL
  Filled 2024-08-30: qty 4

## 2024-08-30 MED ORDER — METHYLPREDNISOLONE SODIUM SUCC 125 MG IJ SOLR
125.0000 mg | Freq: Once | INTRAMUSCULAR | Status: AC
  Administered 2024-08-30: 125 mg via INTRAVENOUS
  Filled 2024-08-30: qty 2

## 2024-08-30 MED ORDER — IPRATROPIUM-ALBUTEROL 0.5-2.5 (3) MG/3ML IN SOLN
3.0000 mL | Freq: Four times a day (QID) | RESPIRATORY_TRACT | Status: DC
  Administered 2024-08-30 – 2024-09-03 (×15): 3 mL via RESPIRATORY_TRACT
  Filled 2024-08-30 (×15): qty 3

## 2024-08-30 MED ORDER — IPRATROPIUM-ALBUTEROL 0.5-2.5 (3) MG/3ML IN SOLN
3.0000 mL | RESPIRATORY_TRACT | Status: AC
  Administered 2024-08-30 (×2): 3 mL via RESPIRATORY_TRACT
  Filled 2024-08-30 (×2): qty 3

## 2024-08-30 MED ORDER — AMLODIPINE BESYLATE 10 MG PO TABS
10.0000 mg | ORAL_TABLET | Freq: Every day | ORAL | Status: DC
  Administered 2024-08-30 – 2024-09-03 (×5): 10 mg via ORAL
  Filled 2024-08-30 (×3): qty 1
  Filled 2024-08-30: qty 2
  Filled 2024-08-30: qty 1

## 2024-08-30 MED ORDER — ALBUTEROL SULFATE (2.5 MG/3ML) 0.083% IN NEBU
2.5000 mg | INHALATION_SOLUTION | RESPIRATORY_TRACT | Status: DC | PRN
  Administered 2024-08-31 – 2024-09-03 (×5): 2.5 mg via RESPIRATORY_TRACT
  Filled 2024-08-30 (×5): qty 3

## 2024-08-30 NOTE — Plan of Care (Signed)

## 2024-08-30 NOTE — ED Triage Notes (Signed)
 Pt reports SHOB that started last night. Denies fevers or cough. Hx of COPD.

## 2024-08-30 NOTE — ED Provider Notes (Signed)
 [place] EMERGENCY DEPARTMENT AT [facility] Provider Note   CSN: [id number] Arrival date & time: 08/30/24  9273     Patient presents with: Shortness of Breath   [name] is a 73 y.o. female.   [name] is a 73 yo female with PMH of COPD presenting with shortness of breath.  Patient reports having URI symptoms for about 2 weeks. Patient reports this feels like one of her COPD exacerbations. She does not use home oxygen  at baseline. Has needed to use rescue albuterol  inhaler frequently. Endorses missing some doses of maintenance Ellipta inhaler a few times per week. Endorses cough productive of sputum. States she smokes about 1 ppd, has COPD. She has frequent COPD exacerbations, last hospitalized 9/27 for the same.   Shortness of Breath Associated symptoms: cough   Associated symptoms: no abdominal pain, no chest pain, no ear pain, no fever, no rash, no sore throat and no vomiting      Prior to Admission medications   Medication Sig Start Date End Date Taking? Authorizing Provider  acetaminophen  (TYLENOL ) 500 MG tablet Take 1,000 mg by mouth daily as needed for moderate pain (pain score 4-6) or mild pain (pain score 1-3).    [provider]  albuterol  (PROVENTIL ) (2.5 MG/3ML) 0.083% nebulizer solution Take 3 mLs (2.5 mg total) by nebulization every 4 (four) hours as needed for wheezing or shortness of breath. 04/21/24   Elnora Ip, MD  albuterol  (VENTOLIN  HFA) 108 (90 Base) MCG/ACT inhaler Inhale 2 puffs into the lungs every 6 (six) hours as needed for wheezing or shortness of breath. 01/10/24   Jolaine Pac, DO  amLODipine  (NORVASC ) 10 MG tablet Take 1 tablet (10 mg total) by mouth daily. 03/19/24 03/19/25  Fernand Prost, MD  fluticasone  (FLONASE ) 50 MCG/ACT nasal spray Place 2 sprays into both nostrils daily. Patient taking differently: Place 2 sprays into both nostrils daily as needed. 05/30/24   Sebastian Toribio GAILS, MD   Fluticasone  Furoate (ARNUITY ELLIPTA ) 100 MCG/ACT AEPB Inhale 1 puff into the lungs daily at 8 pm. Patient taking differently: Inhale 1 puff into the lungs daily as needed. 07/14/24 09/12/24  Lue Elsie BROCKS, MD  losartan -hydrochlorothiazide  (HYZAAR) 50-12.5 MG tablet Take 1 tablet by mouth 2 (two) times daily. 03/20/24   Gomez-Caraballo, Maria, MD  metoprolol  tartrate (LOPRESSOR ) 50 MG tablet Take 1 tablet (50 mg total) by mouth 2 (two) times daily. 04/22/24   Vann, Jessica U, DO  nicotine  (NICODERM CQ  - DOSED IN MG/24 HOURS) 14 mg/24hr patch Place 1 patch (14 mg total) onto the skin daily. Patient not taking: Reported on 08/07/2024 08/05/24 08/05/25  Barbarann Nest, MD  umeclidinium-vilanterol (ANORO ELLIPTA ) 62.5-25 MCG/ACT AEPB Inhale 1 puff into the lungs daily. 07/15/24   Lue Elsie BROCKS, MD    Allergies: Ace inhibitors    Review of Systems  Constitutional:  Negative for chills and fever.  HENT:  Negative for ear pain and sore throat.   Eyes:  Negative for pain and visual disturbance.  Respiratory:  Positive for cough, chest tightness and shortness of breath.   Cardiovascular:  Negative for chest pain and palpitations.  Gastrointestinal:  Negative for abdominal pain and vomiting.  Genitourinary:  Negative for dysuria and hematuria.  Musculoskeletal:  Negative for arthralgias and back pain.  Skin:  Negative for color change and rash.  Neurological:  Negative for seizures and syncope.  All other systems reviewed and are negative.   Updated Vital Signs BP (!) 147/76   Pulse ROLLEN)  54   Resp 19   SpO2 99%   Physical Exam Vitals and nursing note reviewed.  Constitutional:      General: She is in acute distress.     Appearance: She is well-developed.  HENT:     Head: Normocephalic and atraumatic.  Eyes:     Conjunctiva/sclera: Conjunctivae normal.  Cardiovascular:     Rate and Rhythm: Tachycardia present. Rhythm irregular.     Heart sounds: No murmur heard. Pulmonary:      Effort: Tachypnea and respiratory distress present. No accessory muscle usage.     Breath sounds: No stridor. Wheezing and rhonchi present.  Chest:     Chest wall: No tenderness.  Abdominal:     Palpations: Abdomen is soft.     Tenderness: There is no abdominal tenderness.  Musculoskeletal:        General: No swelling.     Cervical back: Neck supple.     Right lower leg: No edema.     Left lower leg: No edema.  Skin:    General: Skin is warm and dry.     Capillary Refill: Capillary refill takes 2 to 3 seconds.     Coloration: Skin is not cyanotic.  Neurological:     General: No focal deficit present.     Mental Status: She is alert.  Psychiatric:        Mood and Affect: Mood normal.     (all labs ordered are listed, but only abnormal results are displayed) Labs Reviewed  RESP PANEL BY RT-PCR (RSV, FLU A&B, COVID)  [id number]  BASIC METABOLIC PANEL WITH GFR  CBC  TROPONIN T, HIGH SENSITIVITY    EKG: EKG Interpretation Date/Time:  Thursday August 30 2024 08:06:52 EDT Ventricular Rate:  128 PR Interval:  127 QRS Duration:  95 QT Interval:  332 QTC Calculation: 479 R Axis:   70  Text Interpretation: Sinus tachycardia Multiform ventricular premature complexes LAE, consider biatrial enlargement Probable anteroseptal infarct, old Nonspecific repol abnormality, lateral leads Artifact in lead(s) II aVF V5 Confirmed by Dasie Faden ([id number]) on 08/30/2024 8:08:44 AM  Radiology: [facility] Chest 2 View Result Date: 08/30/2024 CLINICAL DATA:  SHOB EXAM: CHEST - 2 VIEW COMPARISON:  August 04, 2024 FINDINGS: Emphysema. No focal airspace consolidation, pleural effusion, or pneumothorax. No cardiomegaly. Tortuous aorta with aortic atherosclerosis. No acute fracture or destructive lesions. Multilevel thoracic osteophytosis. Right axillary surgical clips. Cervical fusion hardware. IMPRESSION: Emphysema. No pneumonia or pulmonary edema. Electronically Signed   By: Rogelia Myers M.D.   On:  08/30/2024 08:10     Procedures   Medications Ordered in the ED  ipratropium-albuterol  (DUONEB) 0.5-2.5 (3) MG/3ML nebulizer solution 3 mL (3 mLs Nebulization Given 08/30/24 0800)   Clinical Course as of 08/30/24 1013  Thu Aug 30, 2024  1001 Creatinine(!): 1.27 Aki, appears dry on exam, given IVF 1L [SG]  1001 Troponin T High Sensitivity(!): 41 No acute cp, favor demand ischemia in setting of resp distress, trend, given asa [SG]    Clinical Course User Index [SG] Elnor Jayson LABOR, DO                                 Medical Decision Making Nathan Stallworth is a 73 yo female with PMH of COPD, tobacco use, CKD, and T2DM.  Patient presenting in moderate respiratory distress requiring 3 L of supplemental oxygen .  CXR with emphysema but no evidence of pulmonary edema or  pneumonia.  Given productive cough, oxygen  requirement, and wheezing favor COPD exacerbation is most likely etiology, though also considered volume overload, PNA, arrhythmia, and ACS.  Ordered DuoNeb course x 3, Solu-Medrol  125 mg, and IV magnesium  2 g.  EKG and continuous cardiac monitoring showing relatively high burden of PVCs and tachycardia without acute ischemic changes.  Patient denies chest pain, no cardiac history.  Troponin flat mildly elevated to 41, repeat ordered and pending at time of admission, suspect demand ischemia.  Respiratory distress persistent and patient received additional 1 hour albuterol  nebulizer treatment.  BMP showed AKI and 1 L LR administered, CBC showed no anemia or leukocytosis.  Magnesium  within normal limits, no extreme electrolyte abnormalities.  Given persistent respiratory distress, consulted hospitalist for admission for COPD exacerbation.  Amount and/or Complexity of Data Reviewed Labs: ordered. Decision-making details documented in ED Course. Radiology: ordered and independent interpretation performed. Decision-making details documented in ED Course. ECG/medicine tests: ordered and  independent interpretation performed. Decision-making details documented in ED Course.  Risk OTC drugs. Prescription drug management. Decision regarding hospitalization.      Final diagnoses:  None    ED Discharge Orders     None        Naly Schwanz, MD 08/30/24 1102    Elnor Savant A, DO 08/30/24 1323

## 2024-08-30 NOTE — H&P (Signed)
 History and Physical  [name] FMW:[id number] DOB: 06-17-1951 DOA: 08/30/2024  PCP: Elnora Ip, MD   Chief Complaint: Cough, shortness of breath  HPI: [name] is a 73 y.o. female with medical history significant for COPD, hypertension, ongoing tobacco abuse being admitted to the hospital with 5 days of progressive congestion, shortness of breath, wheezing due to acute exacerbation of COPD.  Patient states she was in her usual state of health until about 5 days ago when she started to have cold-like symptoms, including sneezing and nasal congestion.  No chest pain, no fevers, no productive cough, but progressive dyspnea with exertion and wheezing over the last few days.  She tried taking her home inhalers, but they did not seem to help.  Decided to come to the emergency department, she was found to be in moderate respiratory distress requiring 3 L of supplemental oxygen , she does not wear oxygen  at baseline.  She was treated with DuoNeb, Solu-Medrol  125 mg IV x 1, and IV magnesium  2 g.  Due to persistent dyspnea and need for supplemental oxygen , she was admitted to the hospitalist service.  Review of Systems: Please see HPI for pertinent positives and negatives. A complete 10 system review of systems are otherwise negative.  Past Medical History:  Diagnosis Date   Acute sinusitis 01/09/2019   Acute sinusitis 01/09/2019   Anxiety    Arthritis    fingers, knees (08/16/2018)   Asthma    Atopic dermatitis 03/20/2018   Axillary hidradenitis suppurativa 10/13/2018   Cancer of right breast (HCC) 1991   s/p lumpectomy, chemotherapy and radiation therapy in 1991. Mammogram in 2007 was normal.   Cellulitis of buttock, left 12/13/2022   Constipated    h/o   COPD (chronic obstructive pulmonary disease) (HCC)    History of multiple hospital admissions for exercabation    COPD exacerbation (HCC) 01/01/2019   COPD exacerbation (HCC) 02/17/2024   COPD with acute  exacerbation (HCC) 01/14/2019   COPD with exacerbation (HCC) 04/06/2009   Qualifier: Diagnosis of  By: Loletta MD, Vijay     Depression    Diarrhea    h/o   Facial cellulitis 08/30/2022   Facial edema 08/31/2022   GERD (gastroesophageal reflux disease)    Grade I diastolic dysfunction 01/12/2024   Headache    a few times/month (08/16/2018   Heart murmur 10/05/2011   first time I ever heard I had one was today   History of breast cancer 12/01/2012   Pt with h/o breast CA s/p lumpectomy with chemo/radiation in 1991. Pt mammogram 2011 was unremarkable. Had CT chest 10/2012 in ED for SOB and showed spiculated nodule with lymph node. 12/04/12: Birads 2; repeat diagnostic mammogram in 1 year.     Hyperlipidemia    Hypertension    Lower extremity edema 09/21/2018   Obesity    Personal history of chemotherapy    Personal history of radiation therapy    Pneumonia    couple times in the last 10-15 yrs (08/16/2018)   QT prolongation 08/08/2014   Seasonal allergies 02/25/2017   Shortness of breath 10/05/2011   at rest; lying down; w/exertion   Sigmoid diverticulitis 80/2008   Tobacco abuse    Type 2 diabetes mellitus (HCC) 05/14/2009   Type II diabetes mellitus (HCC)    Past Surgical History:  Procedure Laterality Date   ABDOMINAL HYSTERECTOMY     ANTERIOR CERVICAL DECOMP/DISCECTOMY FUSION  2012   Dr. Beuford  put plate in; did something to my  vertebrae   BACK SURGERY     BREAST LUMPECTOMY Right 1991   DOBUTAMINE  STRESS ECHO  08/2004   Inferior ischemia, normal LV systolic function, no significant CAD   MINOR IRRIGATION AND DEBRIDEMENT OF WOUND Right 09/04/2022   Procedure: IRRIGATION AND DEBRIDEMENT OF NASAL WOUND;  Surgeon: Luciano Standing, MD;  Location: [facility] OR;  Service: ENT;  Laterality: Right;   Social History:  reports that she has been smoking cigarettes. She has a 22.5 pack-year smoking history. She has never used smokeless tobacco. She reports current alcohol  use of  about 4.0 standard drinks of alcohol  per week. She reports that she does not use drugs.  Allergies  Allergen Reactions   Ace Inhibitors Anaphylaxis, Swelling and Other (See Comments)    Throat swelling  (Re: Lisinopril -hydrochlorothiazide )  Pt questioned if she is allergic in 2025.    Family History  Problem Relation Age of Onset   Cancer Mother        unknown   Breast cancer Daughter 49   Breast cancer Daughter        53s   BRCA 1/2 Neg Hx      Prior to Admission medications   Medication Sig Start Date End Date Taking? Authorizing Provider  acetaminophen  (TYLENOL ) 500 MG tablet Take 1,000 mg by mouth daily as needed for moderate pain (pain score 4-6) or mild pain (pain score 1-3).    [provider]  albuterol  (PROVENTIL ) (2.5 MG/3ML) 0.083% nebulizer solution Take 3 mLs (2.5 mg total) by nebulization every 4 (four) hours as needed for wheezing or shortness of breath. 04/21/24   Elnora Ip, MD  albuterol  (VENTOLIN  HFA) 108 (90 Base) MCG/ACT inhaler Inhale 2 puffs into the lungs every 6 (six) hours as needed for wheezing or shortness of breath. 01/10/24   Jolaine Pac, DO  amLODipine  (NORVASC ) 10 MG tablet Take 1 tablet (10 mg total) by mouth daily. 03/19/24 03/19/25  Fernand Prost, MD  fluticasone  (FLONASE ) 50 MCG/ACT nasal spray Place 2 sprays into both nostrils daily. Patient taking differently: Place 2 sprays into both nostrils daily as needed. 05/30/24   Sebastian Toribio GAILS, MD  Fluticasone  Furoate (ARNUITY ELLIPTA ) 100 MCG/ACT AEPB Inhale 1 puff into the lungs daily at 8 pm. Patient taking differently: Inhale 1 puff into the lungs daily as needed. 07/14/24 09/12/24  Lue Elsie BROCKS, MD  losartan -hydrochlorothiazide  (HYZAAR) 50-12.5 MG tablet Take 1 tablet by mouth 2 (two) times daily. 03/20/24   Gomez-Caraballo, Maria, MD  metoprolol  tartrate (LOPRESSOR ) 50 MG tablet Take 1 tablet (50 mg total) by mouth 2 (two) times daily. 04/22/24   Vann, Jessica U, DO   nicotine  (NICODERM CQ  - DOSED IN MG/24 HOURS) 14 mg/24hr patch Place 1 patch (14 mg total) onto the skin daily. Patient not taking: Reported on 08/07/2024 08/05/24 08/05/25  Barbarann Nest, MD  umeclidinium-vilanterol (ANORO ELLIPTA ) 62.5-25 MCG/ACT AEPB Inhale 1 puff into the lungs daily. 07/15/24   Lue Elsie BROCKS, MD    Physical Exam: BP (!) 201/75 (BP Location: Left Arm)   Pulse (!) 57   Temp 97.8 F (36.6 C) (Oral)   Resp (!) 29   SpO2 100%  General:  Alert, oriented, calm, in no acute distress, mild respiratory distress at rest, wearing 3 L nasal cannula oxygen .  Speaking 3-4 words at a time. Cardiovascular: RRR, no murmurs or rubs, no peripheral edema  Respiratory: Breath sounds are diminished globally, with diffuse rhonchi.  No active wheezing, no tachypnea or accessory muscle use. Abdomen: soft, nontender,  nondistended, normal bowel tones heard  Skin: dry, no rashes  Musculoskeletal: no joint effusions, normal range of motion  Psychiatric: appropriate affect, normal speech  Neurologic: extraocular muscles intact, clear speech, moving all extremities with intact sensorium         Labs on Admission:  Basic Metabolic Panel: Recent Labs  Lab 08/30/24 0855  NA 141  K 3.6  CL 102  CO2 26  GLUCOSE 116*  BUN 10  CREATININE 1.27*  CALCIUM  7.8*  MG 1.8   Liver Function Tests: No results for input(s): AST, ALT, ALKPHOS, BILITOT, PROT, ALBUMIN in the last 168 hours. No results for input(s): LIPASE, AMYLASE in the last 168 hours. No results for input(s): AMMONIA in the last 168 hours. CBC: Recent Labs  Lab 08/30/24 0855  WBC 5.1  HGB 12.1  HCT 39.5  MCV 88.8  PLT 304   Cardiac Enzymes: No results for input(s): CKTOTAL, CKMB, CKMBINDEX, TROPONINI in the last 168 hours. BNP (last 3 results) Recent Labs    02/17/24 1633 04/18/24 1809 06/20/24 0825  BNP 59.5 108.1* 161.8*    ProBNP (last 3 results) No results for input(s): PROBNP  in the last 8760 hours.  CBG: No results for input(s): GLUCAP in the last 168 hours.  Radiological Exams on Admission: DG Chest 2 View Result Date: 08/30/2024 CLINICAL DATA:  SHOB EXAM: CHEST - 2 VIEW COMPARISON:  August 04, 2024 FINDINGS: Emphysema. No focal airspace consolidation, pleural effusion, or pneumothorax. No cardiomegaly. Tortuous aorta with aortic atherosclerosis. No acute fracture or destructive lesions. Multilevel thoracic osteophytosis. Right axillary surgical clips. Cervical fusion hardware. IMPRESSION: Emphysema. No pneumonia or pulmonary edema. Electronically Signed   By: Rogelia Myers M.D.   On: 08/30/2024 08:10   Assessment/Plan KYANNAH CLIMER is a 73 y.o. female with medical history significant for COPD, hypertension, ongoing tobacco abuse being admitted to the hospital with 5 days of progressive congestion, shortness of breath, wheezing due to acute exacerbation of COPD.  Acute exacerbation of COPD-with increased cough, dyspnea, and shortness of breath.  No evidence of acute bacterial infection. -Observation admission -Scheduled DuoNebs, as needed albuterol  -IV Solu-Medrol  -Follow-up virus panel -Incentive spirometer, flutter valve  Acute hypoxic respiratory failure-due to acute exacerbation of COPD, being treated as above  Hypertension-blood pressure currently uncontrolled, patient has been noncompliant with her medication in the past  -Will plan to continue home amlodipine  and metoprolol  -Hyzaar being held due to AKI -IV hydralazine  if needed for persistent SBP greater than 160  AKI-likely prerenal from relative dehydration -Hydrate gently with normal saline -Hold nephrotoxins such as her Hyzaar -Will recheck renal function in the morning  Elevated troponin-likely demand ischemia due to her sinus tachycardia and significant PVC burden.  Patient denies chest pain. -Trend troponin  Tobacco abuse-once again patient has been strongly counseled to  completely cease the use of tobacco products -Nicotine  patch  DVT prophylaxis: Lovenox      Code Status: Full Code  Consults called: None  Admission status: Observation  Time spent: 49 minutes  Dereck Agerton CHRISTELLA Gail MD [facility] Pager [phone]  If 7PM-7AM, please contact night-coverage [email or web address] Password [email or web address]  08/30/2024, 11:04 AM

## 2024-08-30 NOTE — Progress Notes (Signed)
 Patient has removed her nasal cannula 3 times. Education provide to the patient each time I have entered her room and she had her nasal cannula off. Reapplied nasal cannula. Plan of care ongoing.

## 2024-08-31 DIAGNOSIS — E860 Dehydration: Secondary | ICD-10-CM | POA: Diagnosis present

## 2024-08-31 DIAGNOSIS — D649 Anemia, unspecified: Secondary | ICD-10-CM | POA: Diagnosis present

## 2024-08-31 DIAGNOSIS — J441 Chronic obstructive pulmonary disease with (acute) exacerbation: Secondary | ICD-10-CM | POA: Diagnosis present

## 2024-08-31 DIAGNOSIS — N179 Acute kidney failure, unspecified: Secondary | ICD-10-CM | POA: Diagnosis present

## 2024-08-31 DIAGNOSIS — Z90710 Acquired absence of both cervix and uterus: Secondary | ICD-10-CM | POA: Diagnosis not present

## 2024-08-31 DIAGNOSIS — Z803 Family history of malignant neoplasm of breast: Secondary | ICD-10-CM | POA: Diagnosis not present

## 2024-08-31 DIAGNOSIS — Z91148 Patient's other noncompliance with medication regimen for other reason: Secondary | ICD-10-CM | POA: Diagnosis not present

## 2024-08-31 DIAGNOSIS — J9601 Acute respiratory failure with hypoxia: Secondary | ICD-10-CM | POA: Diagnosis present

## 2024-08-31 DIAGNOSIS — F17210 Nicotine dependence, cigarettes, uncomplicated: Secondary | ICD-10-CM | POA: Diagnosis present

## 2024-08-31 DIAGNOSIS — T465X6A Underdosing of other antihypertensive drugs, initial encounter: Secondary | ICD-10-CM | POA: Diagnosis present

## 2024-08-31 DIAGNOSIS — N189 Chronic kidney disease, unspecified: Secondary | ICD-10-CM | POA: Diagnosis present

## 2024-08-31 DIAGNOSIS — E1122 Type 2 diabetes mellitus with diabetic chronic kidney disease: Secondary | ICD-10-CM | POA: Diagnosis present

## 2024-08-31 DIAGNOSIS — I493 Ventricular premature depolarization: Secondary | ICD-10-CM | POA: Diagnosis present

## 2024-08-31 DIAGNOSIS — I2489 Other forms of acute ischemic heart disease: Secondary | ICD-10-CM | POA: Diagnosis present

## 2024-08-31 DIAGNOSIS — Z923 Personal history of irradiation: Secondary | ICD-10-CM | POA: Diagnosis not present

## 2024-08-31 DIAGNOSIS — Z1152 Encounter for screening for COVID-19: Secondary | ICD-10-CM | POA: Diagnosis not present

## 2024-08-31 DIAGNOSIS — Z9221 Personal history of antineoplastic chemotherapy: Secondary | ICD-10-CM | POA: Diagnosis not present

## 2024-08-31 DIAGNOSIS — I129 Hypertensive chronic kidney disease with stage 1 through stage 4 chronic kidney disease, or unspecified chronic kidney disease: Secondary | ICD-10-CM | POA: Diagnosis present

## 2024-08-31 DIAGNOSIS — Z8701 Personal history of pneumonia (recurrent): Secondary | ICD-10-CM | POA: Diagnosis not present

## 2024-08-31 DIAGNOSIS — J439 Emphysema, unspecified: Secondary | ICD-10-CM | POA: Diagnosis present

## 2024-08-31 DIAGNOSIS — E785 Hyperlipidemia, unspecified: Secondary | ICD-10-CM | POA: Diagnosis present

## 2024-08-31 DIAGNOSIS — Z23 Encounter for immunization: Secondary | ICD-10-CM | POA: Diagnosis present

## 2024-08-31 DIAGNOSIS — Z853 Personal history of malignant neoplasm of breast: Secondary | ICD-10-CM | POA: Diagnosis not present

## 2024-08-31 LAB — BASIC METABOLIC PANEL WITH GFR
Anion gap: 15 (ref 5–15)
BUN: 18 mg/dL (ref 8–23)
CO2: 21 mmol/L — ABNORMAL LOW (ref 22–32)
Calcium: 8.8 mg/dL — ABNORMAL LOW (ref 8.9–10.3)
Chloride: 103 mmol/L (ref 98–111)
Creatinine, Ser: 1.1 mg/dL — ABNORMAL HIGH (ref 0.44–1.00)
GFR, Estimated: 53 mL/min — ABNORMAL LOW (ref >60)
Glucose, Bld: 159 mg/dL — ABNORMAL HIGH (ref 70–99)
Potassium: 3.9 mmol/L (ref 3.5–5.1)
Sodium: 139 mmol/L (ref 135–145)

## 2024-08-31 LAB — CBC
HCT: 39.9 % (ref 36.0–46.0)
Hemoglobin: 11.7 g/dL — ABNORMAL LOW (ref 12.0–15.0)
MCH: 27 pg (ref 26.0–34.0)
MCHC: 29.3 g/dL — ABNORMAL LOW (ref 30.0–36.0)
MCV: 91.9 fL (ref 80.0–100.0)
Platelets: 298 K/uL (ref 150–400)
RBC: 4.34 MIL/uL (ref 3.87–5.11)
RDW: 15.1 % (ref 11.5–15.5)
WBC: 2.7 K/uL — ABNORMAL LOW (ref 4.0–10.5)
nRBC: 0 % (ref 0.0–0.2)

## 2024-08-31 MED ORDER — GUAIFENESIN ER 600 MG PO TB12
600.0000 mg | ORAL_TABLET | Freq: Two times a day (BID) | ORAL | Status: DC
  Administered 2024-08-31 – 2024-09-03 (×6): 600 mg via ORAL
  Filled 2024-08-31 (×6): qty 1

## 2024-08-31 MED ORDER — UMECLIDINIUM-VILANTEROL 62.5-25 MCG/ACT IN AEPB
1.0000 | INHALATION_SPRAY | Freq: Every day | RESPIRATORY_TRACT | Status: DC
  Administered 2024-08-31 – 2024-09-03 (×4): 1 via RESPIRATORY_TRACT
  Filled 2024-08-31: qty 14

## 2024-08-31 MED ORDER — BUDESONIDE 0.5 MG/2ML IN SUSP
0.5000 mg | Freq: Two times a day (BID) | RESPIRATORY_TRACT | Status: DC
Start: 2024-08-31 — End: 2024-09-03
  Administered 2024-08-31 – 2024-09-03 (×7): 0.5 mg via RESPIRATORY_TRACT
  Filled 2024-08-31 (×6): qty 2

## 2024-08-31 MED ORDER — FLUTICASONE PROPIONATE 50 MCG/ACT NA SUSP
2.0000 | Freq: Every day | NASAL | Status: DC | PRN
  Filled 2024-08-31: qty 16

## 2024-08-31 MED ORDER — MENTHOL 3 MG MT LOZG
1.0000 | LOZENGE | OROMUCOSAL | Status: DC | PRN
  Administered 2024-08-31: 3 mg via ORAL
  Filled 2024-08-31: qty 9

## 2024-08-31 NOTE — Progress Notes (Signed)
 PROGRESS NOTE    [name]  FMW:[id number] DOB: [birth year] DOA: 08/30/2024 PCP: Elnora Ip, MD   Brief Narrative:  [name] is a 73 y.o. female with medical history significant for COPD, hypertension, ongoing tobacco abuse being admitted to the hospital with 5 days of progressive congestion, shortness of breath, wheezing due to acute exacerbation of COPD.    Assessment & Plan:   Active Problems:   COPD with acute exacerbation (HCC)  Acute exacerbation of COPD Acute hypoxic respiratory failure, POA - Unclear provoking factor, remains markedly dyspneic with diffuse coarse breath sounds and hypoxia from baseline (not on oxygen  at baseline). Markedly tachypneic with accessory muscle use. - Patient somewhat noncompliant with oxygen  over the past 24 hours, lengthy discussion at bedside about need for medication compliance given patient's comorbidities and risk factors - Continue nebs, steroids, resume home inhalers - Virus panel negative - Encourage incentive spirometry and flutter;    Essential hypertension - Blood pressure moderately well controlled - Resume amlodipine , metoprolol  - Follow repeat readings (likely elevated at times due to coughing/respiratory distress)  AKI-likely prerenal from relative dehydration -Continue IV fluids as appropriate, follow increased p.o. intake - Creatinine downtrending appropriately   Elevated troponin - Without symptoms of ACS, likely secondary to above hypoxia and tachycardia   Tobacco abuse, chronic, ongoing - Counseled at length at bedside about cessation  DVT prophylaxis: enoxaparin  (LOVENOX ) injection 40 mg Start: 08/30/24 1115 Code Status:   Code Status: Full Code Family Communication: None present  Status is: Inpatient  Dispo: The patient is from: Home              Anticipated d/c is to: Home              Anticipated d/c date is: 48 to 72 hours              Patient currently not medically stable for  discharge  Consultants:  None clear  Procedures:  None  Antimicrobials:  None indicated  Subjective: No acute issues or events overnight, patient mains markedly dyspneic, short 2-3 word sentences, using accessory muscle use with profound coughing spells during evaluation.  Denies chest pain nausea vomiting headache fevers chills diarrhea or constipation.  Objective: Vitals:   08/31/24 0140 08/31/24 0624 08/31/24 0641 08/31/24 1126  BP:   108/69   Pulse: 95  84   Resp: 18     Temp:   (!) 97.4 F (36.3 C)   TempSrc:   Oral   SpO2: 100% 100% 100% 98%  Weight:      Height:        Intake/Output Summary (Last 24 hours) at 08/31/2024 1341 Last data filed at 08/31/2024 1000 Gross per 24 hour  Intake 2136.67 ml  Output 1500 ml  Net 636.67 ml   Filed Weights   08/30/24 1401  Weight: 78.2 kg    Examination:  General:  Pleasantly resting in bed, No acute distress. HEENT:  Normocephalic atraumatic.  Sclerae nonicteric, noninjected.  Extraocular movements intact bilaterally. Neck:  Without mass or deformity.  Trachea is midline. Lungs: Tachypneic with accessory muscle use, coarse breath sounds bilaterally and throughout with fulminant expiratory wheeze Heart: Tachycardic.  Without murmurs, rubs, or gallops. Abdomen:  Soft, nontender, nondistended.  Without guarding or rebound. Extremities: Without cyanosis, clubbing, edema, or obvious deformity. Skin:  Warm and dry, no erythema.  Data Reviewed: I have personally reviewed following labs and imaging studies  CBC: Recent Labs  Lab 08/30/24 0855 08/31/24 0453  WBC 5.1 2.7*  HGB 12.1 11.7*  HCT 39.5 39.9  MCV 88.8 91.9  PLT 304 298   Basic Metabolic Panel: Recent Labs  Lab 08/30/24 0855 08/31/24 0453  NA 141 139  K 3.6 3.9  CL 102 103  CO2 26 21*  GLUCOSE 116* 159*  BUN 10 18  CREATININE 1.27* 1.10*  CALCIUM  7.8* 8.8*  MG 1.8  --    GFR: Estimated Creatinine Clearance: 48.1 mL/min (A) (by C-G formula based  on SCr of 1.1 mg/dL (H)).  Recent Results (from the past 240 hours)  Resp panel by RT-PCR (RSV, Flu A&B, Covid) Anterior Nasal Swab     Status: None   Collection Time: 08/30/24 10:46 AM   Specimen: Anterior Nasal Swab  Result Value Ref Range Status   SARS Coronavirus 2 by RT PCR NEGATIVE NEGATIVE Final    Comment: (NOTE) SARS-CoV-2 target nucleic acids are NOT DETECTED.  The SARS-CoV-2 RNA is generally detectable in upper respiratory specimens during the acute phase of infection. The lowest concentration of SARS-CoV-2 viral copies this assay can detect is 138 copies/mL. A negative result does not preclude SARS-Cov-2 infection and should not be used as the sole basis for treatment or other patient management decisions. A negative result may occur with  improper specimen collection/handling, submission of specimen other than nasopharyngeal swab, presence of viral mutation(s) within the areas targeted by this assay, and inadequate number of viral copies(<138 copies/mL). A negative result must be combined with clinical observations, patient history, and epidemiological information. The expected result is Negative.  Fact Sheet for Patients:  BloggerCourse.com  Fact Sheet for Healthcare Providers:  SeriousBroker.it  This test is no t yet approved or cleared by the [place]  FDA and  has been authorized for detection and/or diagnosis of SARS-CoV-2 by FDA under an Emergency Use Authorization (EUA). This EUA will remain  in effect (meaning this test can be used) for the duration of the COVID-19 declaration under Section 564(b)(1) of the Act, 21 U.S.C.section 360bbb-3(b)(1), unless the authorization is terminated  or revoked sooner.       Influenza A by PCR NEGATIVE NEGATIVE Final   Influenza B by PCR NEGATIVE NEGATIVE Final    Comment: (NOTE) The Xpert Xpress SARS-CoV-2/FLU/RSV plus assay is intended as an aid in the diagnosis of  influenza from Nasopharyngeal swab specimens and should not be used as a sole basis for treatment. Nasal washings and aspirates are unacceptable for Xpert Xpress SARS-CoV-2/FLU/RSV testing.  Fact Sheet for Patients: BloggerCourse.com  Fact Sheet for Healthcare Providers: SeriousBroker.it  This test is not yet approved or cleared by the [place]  FDA and has been authorized for detection and/or diagnosis of SARS-CoV-2 by FDA under an Emergency Use Authorization (EUA). This EUA will remain in effect (meaning this test can be used) for the duration of the COVID-19 declaration under Section 564(b)(1) of the Act, 21 U.S.C. section 360bbb-3(b)(1), unless the authorization is terminated or revoked.     Resp Syncytial Virus by PCR NEGATIVE NEGATIVE Final    Comment: (NOTE) Fact Sheet for Patients: BloggerCourse.com  Fact Sheet for Healthcare Providers: SeriousBroker.it  This test is not yet approved or cleared by the [place]  FDA and has been authorized for detection and/or diagnosis of SARS-CoV-2 by FDA under an Emergency Use Authorization (EUA). This EUA will remain in effect (meaning this test can be used) for the duration of the COVID-19 declaration under Section 564(b)(1) of the Act, 21 U.S.C. section 360bbb-3(b)(1), unless the authorization is terminated  or revoked.  Performed at [facility], [address], [place], [place] [address]          Radiology Studies: DG Chest 2 View Result Date: 08/30/2024 CLINICAL DATA:  SHOB EXAM: CHEST - 2 VIEW COMPARISON:  August 04, 2024 FINDINGS: Emphysema. No focal airspace consolidation, pleural effusion, or pneumothorax. No cardiomegaly. Tortuous aorta with aortic atherosclerosis. No acute fracture or destructive lesions. Multilevel thoracic osteophytosis. Right axillary surgical clips. Cervical fusion  hardware. IMPRESSION: Emphysema. No pneumonia or pulmonary edema. Electronically Signed   By: Rogelia Myers M.D.   On: 08/30/2024 08:10        Scheduled Meds:  amLODipine   10 mg Oral Daily   budesonide   0.5 mg Nebulization BID   enoxaparin  (LOVENOX ) injection  40 mg Subcutaneous Daily   ipratropium-albuterol   3 mL Nebulization QID   methylPREDNISolone  (SOLU-MEDROL ) injection  40 mg Intravenous Q12H   metoprolol  tartrate  50 mg Oral BID   nicotine   14 mg Transdermal Daily   umeclidinium-vilanterol  1 puff Inhalation Daily   Continuous Infusions:   LOS: 0 days   Time spent:  Elsie JAYSON Montclair, DO [facility]  If 7PM-7AM, please contact night-coverage [email or web address]  08/31/2024, 1:41 PM

## 2024-08-31 NOTE — Progress Notes (Signed)
 NT ambulated patient in hallways @ 300 ft, pt oxygen  stayed at 99 the entire walk.

## 2024-08-31 NOTE — Plan of Care (Signed)

## 2024-09-01 DIAGNOSIS — J441 Chronic obstructive pulmonary disease with (acute) exacerbation: Secondary | ICD-10-CM | POA: Diagnosis not present

## 2024-09-01 LAB — CBC
HCT: 37.2 % (ref 36.0–46.0)
Hemoglobin: 10.9 g/dL — ABNORMAL LOW (ref 12.0–15.0)
MCH: 26.8 pg (ref 26.0–34.0)
MCHC: 29.3 g/dL — ABNORMAL LOW (ref 30.0–36.0)
MCV: 91.6 fL (ref 80.0–100.0)
Platelets: 245 K/uL (ref 150–400)
RBC: 4.06 MIL/uL (ref 3.87–5.11)
RDW: 15 % (ref 11.5–15.5)
WBC: 6.1 K/uL (ref 4.0–10.5)
nRBC: 0 % (ref 0.0–0.2)

## 2024-09-01 LAB — BASIC METABOLIC PANEL WITH GFR
Anion gap: 12 (ref 5–15)
BUN: 16 mg/dL (ref 8–23)
CO2: 24 mmol/L (ref 22–32)
Calcium: 8.5 mg/dL — ABNORMAL LOW (ref 8.9–10.3)
Chloride: 103 mmol/L (ref 98–111)
Creatinine, Ser: 0.76 mg/dL (ref 0.44–1.00)
GFR, Estimated: >60 mL/min (ref >60)
Glucose, Bld: 241 mg/dL — ABNORMAL HIGH (ref 70–99)
Potassium: 3.8 mmol/L (ref 3.5–5.1)
Sodium: 140 mmol/L (ref 135–145)

## 2024-09-01 MED ORDER — BENZONATATE 100 MG PO CAPS
100.0000 mg | ORAL_CAPSULE | Freq: Once | ORAL | Status: AC
  Administered 2024-09-01: 100 mg via ORAL
  Filled 2024-09-01: qty 1

## 2024-09-01 NOTE — Plan of Care (Signed)
  Problem: Clinical Measurements: Goal: Ability to maintain clinical measurements within normal limits will improve Outcome: Progressing   Problem: Activity: Goal: Risk for activity intolerance will decrease Outcome: Progressing   Problem: Nutrition: Goal: Adequate nutrition will be maintained Outcome: Progressing   Problem: Coping: Goal: Level of anxiety will decrease Outcome: Progressing   Problem: Pain Managment: Goal: General experience of comfort will improve and/or be controlled Outcome: Progressing

## 2024-09-01 NOTE — Plan of Care (Signed)
  Problem: Activity: Goal: Risk for activity intolerance will decrease Outcome: Progressing   Problem: Pain Managment: Goal: General experience of comfort will improve and/or be controlled Outcome: Progressing   Problem: Safety: Goal: Ability to remain free from injury will improve Outcome: Progressing   Problem: Skin Integrity: Goal: Risk for impaired skin integrity will decrease Outcome: Progressing

## 2024-09-01 NOTE — Progress Notes (Signed)
 PROGRESS NOTE    [name]  FMW:[id number] DOB: Feb 08, 1951 DOA: 08/30/2024 PCP: Elnora Ip, MD   Brief Narrative:  [name] is a 73 y.o. female with medical history significant for COPD, hypertension, ongoing tobacco abuse being admitted to the hospital with 5 days of progressive congestion, shortness of breath, wheezing due to acute exacerbation of COPD.    Assessment & Plan:   Active Problems:   COPD with acute exacerbation (HCC)  Acute exacerbation of COPD Acute hypoxic respiratory failure, POA - Unclear provoking factor, remains markedly dyspneic with diffuse coarse breath sounds and hypoxia from baseline (not on oxygen  at baseline). Markedly tachypneic with accessory muscle use. - Patient continues to be profoundly dyspneic with exertion, today wheezing at rest despite increased steroids/nebs/inhalers yesterday -if no improvement in the next 24-48 hours may be beneficial to consult pulmonology although she likely just needs time to improve - Continue nebs, steroids, resume home inhalers - Virus panel negative - Encourage incentive spirometry and flutter;    Essential hypertension - Blood pressure moderately well controlled - Resume amlodipine , metoprolol  - Follow repeat readings (likely elevated at times due to coughing/respiratory distress)  AKI-likely prerenal from relative dehydration -Continue IV fluids as appropriate, follow increased p.o. intake - Creatinine downtrending appropriately   Elevated troponin - Without symptoms of ACS, likely secondary to above hypoxia and tachycardia   Tobacco abuse, chronic, ongoing - Counseled at length at bedside about cessation  DVT prophylaxis: enoxaparin  (LOVENOX ) injection 40 mg Start: 08/30/24 1115 Code Status:   Code Status: Full Code Family Communication: None present  Status is: Inpatient  Dispo: The patient is from: Home              Anticipated d/c is to: Home              Anticipated  d/c date is: 48 to 72 hours              Patient currently not medically stable for discharge  Consultants:  None clear  Procedures:  None  Antimicrobials:  None indicated  Subjective: No acute issues or events overnight, patient remains markedly dyspneic after exertion with short 2-3 word sentences denies chest pain nausea vomiting diarrhea constipation headache fevers or chills but continues to report profound dyspnea and wheeze even at rest.  Objective: Vitals:   08/31/24 2104 08/31/24 2106 09/01/24 0512 09/01/24 0529  BP:   (!) 159/69   Pulse:   87   Resp:   18   Temp:   97.9 F (36.6 C)   TempSrc:   Oral   SpO2: 100% 100% 100% 100%  Weight:      Height:        Intake/Output Summary (Last 24 hours) at 09/01/2024 0654 Last data filed at 09/01/2024 0200 Gross per 24 hour  Intake 1915 ml  Output 600 ml  Net 1315 ml   Filed Weights   08/30/24 1401  Weight: 78.2 kg    Examination:  General:  Pleasantly resting in bed, No acute distress. HEENT:  Normocephalic atraumatic.  Sclerae nonicteric, noninjected.  Extraocular movements intact bilaterally. Neck:  Without mass or deformity.  Trachea is midline. Lungs: Tachypneic with accessory muscle use, coarse breath sounds bilaterally and throughout with fulminant expiratory wheeze bilaterally (R>L) Heart: Tachycardic.  Without murmurs, rubs, or gallops. Abdomen:  Soft, nontender, nondistended.  Without guarding or rebound. Extremities: Without cyanosis, clubbing, edema, or obvious deformity. Skin:  Warm and dry, no erythema.  Data Reviewed: I have  personally reviewed following labs and imaging studies  CBC: Recent Labs  Lab 08/30/24 0855 08/31/24 0453 09/01/24 0527  WBC 5.1 2.7* 6.1  HGB 12.1 11.7* 10.9*  HCT 39.5 39.9 37.2  MCV 88.8 91.9 91.6  PLT 304 298 245   Basic Metabolic Panel: Recent Labs  Lab 08/30/24 0855 08/31/24 0453 09/01/24 0527  NA 141 139 140  K 3.6 3.9 3.8  CL 102 103 103  CO2 26 21*  24  GLUCOSE 116* 159* 241*  BUN 10 18 16   CREATININE 1.27* 1.10* 0.76  CALCIUM  7.8* 8.8* 8.5*  MG 1.8  --   --    GFR: Estimated Creatinine Clearance: 66.1 mL/min (by C-G formula based on SCr of 0.76 mg/dL).  Recent Results (from the past 240 hours)  Resp panel by RT-PCR (RSV, Flu A&B, Covid) Anterior Nasal Swab     Status: None   Collection Time: 08/30/24 10:46 AM   Specimen: Anterior Nasal Swab  Result Value Ref Range Status   SARS Coronavirus 2 by RT PCR NEGATIVE NEGATIVE Final    Comment: (NOTE) SARS-CoV-2 target nucleic acids are NOT DETECTED.  The SARS-CoV-2 RNA is generally detectable in upper respiratory specimens during the acute phase of infection. The lowest concentration of SARS-CoV-2 viral copies this assay can detect is 138 copies/mL. A negative result does not preclude SARS-Cov-2 infection and should not be used as the sole basis for treatment or other patient management decisions. A negative result may occur with  improper specimen collection/handling, submission of specimen other than nasopharyngeal swab, presence of viral mutation(s) within the areas targeted by this assay, and inadequate number of viral copies(<138 copies/mL). A negative result must be combined with clinical observations, patient history, and epidemiological information. The expected result is Negative.  Fact Sheet for Patients:  bloggercourse.com  Fact Sheet for Healthcare Providers:  seriousbroker.it  This test is no t yet approved or cleared by the [place]  FDA and  has been authorized for detection and/or diagnosis of SARS-CoV-2 by FDA under an Emergency Use Authorization (EUA). This EUA will remain  in effect (meaning this test can be used) for the duration of the COVID-19 declaration under Section 564(b)(1) of the Act, 21 U.S.C.section 360bbb-3(b)(1), unless the authorization is terminated  or revoked sooner.       Influenza  A by PCR NEGATIVE NEGATIVE Final   Influenza B by PCR NEGATIVE NEGATIVE Final    Comment: (NOTE) The Xpert Xpress SARS-CoV-2/FLU/RSV plus assay is intended as an aid in the diagnosis of influenza from Nasopharyngeal swab specimens and should not be used as a sole basis for treatment. Nasal washings and aspirates are unacceptable for Xpert Xpress SARS-CoV-2/FLU/RSV testing.  Fact Sheet for Patients: bloggercourse.com  Fact Sheet for Healthcare Providers: seriousbroker.it  This test is not yet approved or cleared by the [place]  FDA and has been authorized for detection and/or diagnosis of SARS-CoV-2 by FDA under an Emergency Use Authorization (EUA). This EUA will remain in effect (meaning this test can be used) for the duration of the COVID-19 declaration under Section 564(b)(1) of the Act, 21 U.S.C. section 360bbb-3(b)(1), unless the authorization is terminated or revoked.     Resp Syncytial Virus by PCR NEGATIVE NEGATIVE Final    Comment: (NOTE) Fact Sheet for Patients: bloggercourse.com  Fact Sheet for Healthcare Providers: seriousbroker.it  This test is not yet approved or cleared by the [place]  FDA and has been authorized for detection and/or diagnosis of SARS-CoV-2 by FDA under an Emergency Use  Authorization (EUA). This EUA will remain in effect (meaning this test can be used) for the duration of the COVID-19 declaration under Section 564(b)(1) of the Act, 21 U.S.C. section 360bbb-3(b)(1), unless the authorization is terminated or revoked.  Performed at [facility], [address], [place], [place] [address]          Radiology Studies: DG Chest 2 View Result Date: 08/30/2024 CLINICAL DATA:  SHOB EXAM: CHEST - 2 VIEW COMPARISON:  August 04, 2024 FINDINGS: Emphysema. No focal airspace consolidation, pleural effusion, or  pneumothorax. No cardiomegaly. Tortuous aorta with aortic atherosclerosis. No acute fracture or destructive lesions. Multilevel thoracic osteophytosis. Right axillary surgical clips. Cervical fusion hardware. IMPRESSION: Emphysema. No pneumonia or pulmonary edema. Electronically Signed   By: Rogelia Myers M.D.   On: 08/30/2024 08:10        Scheduled Meds:  amLODipine   10 mg Oral Daily   budesonide   0.5 mg Nebulization BID   enoxaparin  (LOVENOX ) injection  40 mg Subcutaneous Daily   guaiFENesin   600 mg Oral BID   ipratropium-albuterol   3 mL Nebulization QID   methylPREDNISolone  (SOLU-MEDROL ) injection  40 mg Intravenous Q12H   metoprolol  tartrate  50 mg Oral BID   nicotine   14 mg Transdermal Daily   umeclidinium-vilanterol  1 puff Inhalation Daily   Continuous Infusions:   LOS: 1 day   Time spent:  Elsie JAYSON Montclair, DO [facility]  If 7PM-7AM, please contact night-coverage [email or web address]  09/01/2024, 6:54 AM

## 2024-09-01 NOTE — Progress Notes (Signed)
SATURATION QUALIFICATIONS: (This note is used to comply with regulatory documentation for home oxygen)  Patient Saturations on Room Air at Rest = 100%  Patient Saturations on Room Air while Ambulating = 100%  Patient Saturations on 0 Liters of oxygen while Ambulating = 100%  Please briefly explain why patient needs home oxygen: 

## 2024-09-02 DIAGNOSIS — J441 Chronic obstructive pulmonary disease with (acute) exacerbation: Secondary | ICD-10-CM | POA: Diagnosis not present

## 2024-09-02 MED ORDER — HYDRALAZINE HCL 20 MG/ML IJ SOLN
5.0000 mg | Freq: Four times a day (QID) | INTRAMUSCULAR | Status: DC | PRN
  Administered 2024-09-02: 5 mg via INTRAVENOUS
  Filled 2024-09-02: qty 1

## 2024-09-02 MED ORDER — LORATADINE 10 MG PO TABS
10.0000 mg | ORAL_TABLET | Freq: Every day | ORAL | Status: DC
  Administered 2024-09-02: 10 mg via ORAL
  Filled 2024-09-02: qty 1

## 2024-09-02 MED ORDER — DOXYCYCLINE HYCLATE 100 MG PO TABS
100.0000 mg | ORAL_TABLET | Freq: Two times a day (BID) | ORAL | Status: DC
  Administered 2024-09-02 – 2024-09-03 (×3): 100 mg via ORAL
  Filled 2024-09-02 (×3): qty 1

## 2024-09-02 NOTE — Plan of Care (Signed)
  Problem: Education: Goal: Knowledge of General Education information will improve Description: Including pain rating scale, medication(s)/side effects and non-pharmacologic comfort measures Outcome: Progressing   Problem: Clinical Measurements: Goal: Ability to maintain clinical measurements within normal limits will improve Outcome: Progressing   Problem: Activity: Goal: Risk for activity intolerance will decrease Outcome: Progressing   Problem: Nutrition: Goal: Adequate nutrition will be maintained Outcome: Progressing   Problem: Coping: Goal: Level of anxiety will decrease Outcome: Progressing   Problem: Pain Managment: Goal: General experience of comfort will improve and/or be controlled Outcome: Progressing   Problem: Safety: Goal: Ability to remain free from injury will improve Outcome: Progressing

## 2024-09-02 NOTE — Progress Notes (Signed)
 PROGRESS NOTE    [name]  FMW:[id number] DOB: [birth year] DOA: 08/30/2024 PCP: Elnora Ip, MD   Brief Narrative:  [name] is a 73 y.o. female with medical history significant for COPD, hypertension, ongoing tobacco abuse being admitted to the hospital with 5 days of progressive congestion, shortness of breath, wheezing due to acute exacerbation of COPD.    Assessment & Plan:   Acute exacerbation of COPD Acute hypoxic respiratory failure, POA Patient admitted with acute exacerbation of COPD. Patient is slow to improve.  Patient noted to be on budesonide  nebulized, Solu-Medrol , Anoro Ellipta . Will add doxycycline . Continue current treatment for now till improvement. Not requiring any oxygen . Patient continues to smoke cigarettes.  She was counseled.   Essential hypertension Amlodipine  and metoprolol  were resumed.  Blood pressure remains poorly controlled.  Steroids likely contributing.  Use as needed agents for now.  AKI Likely prerenal from relative dehydration Improved with IV fluids.   Elevated troponin Mild elevation noted.- Without symptoms of ACS, likely secondary to above hypoxia and tachycardia  Normocytic anemia Drop in hemoglobin is dilutional.  No evidence of overt bleeding.  Check labs in the morning.   Tobacco abuse, chronic, ongoing - Counseled at length at bedside about cessation  DVT prophylaxis: enoxaparin  (LOVENOX ) injection 40 mg Start: 08/30/24 1115 Code Status:   Code Status: Full Code Family Communication: None present Disposition: Hopefully home when improved.    Consultants:  None   Procedures:  None  Antimicrobials:  None indicated  Subjective: Patient mentioned that she is feeling slightly better as though still short of breath with exertion.  Denies any chest pain.  No nausea vomiting.    Objective: Vitals:   09/02/24 0219 09/02/24 0710 09/02/24 0735 09/02/24 0951  BP:  (!) 147/103  (!) 178/83   Pulse:  79  77  Resp:  18    Temp:  98.2 F (36.8 C)    TempSrc:      SpO2: 97% 100% 94%   Weight:      Height:        Intake/Output Summary (Last 24 hours) at 09/02/2024 1010 Last data filed at 09/02/2024 0600 Gross per 24 hour  Intake 2470 ml  Output --  Net 2470 ml   Filed Weights   08/30/24 1401  Weight: 78.2 kg    Examination:  General appearance: Awake alert.  In no distress Resp: My tachypneic at rest.  No use of accessory muscles.  Wheezing heard bilaterally. Cardio: S1-S2 is normal regular.  No S3-S4.  No rubs murmurs or bruit GI: Abdomen is soft.  Nontender nondistended.  Bowel sounds are present normal.  No masses organomegaly Extremities: No edema.  Full range of motion of lower extremities. Neurologic: Alert and oriented x3.  No focal neurological deficits.   Data Reviewed:   CBC: Recent Labs  Lab 08/30/24 0855 08/31/24 0453 09/01/24 0527  WBC 5.1 2.7* 6.1  HGB 12.1 11.7* 10.9*  HCT 39.5 39.9 37.2  MCV 88.8 91.9 91.6  PLT 304 298 245   Basic Metabolic Panel: Recent Labs  Lab 08/30/24 0855 08/31/24 0453 09/01/24 0527  NA 141 139 140  K 3.6 3.9 3.8  CL 102 103 103  CO2 26 21* 24  GLUCOSE 116* 159* 241*  BUN 10 18 16   CREATININE 1.27* 1.10* 0.76  CALCIUM  7.8* 8.8* 8.5*  MG 1.8  --   --    GFR: Estimated Creatinine Clearance: 66.1 mL/min (by C-G formula based on SCr of 0.76  mg/dL).  Recent Results (from the past 240 hours)  Resp panel by RT-PCR (RSV, Flu A&B, Covid) Anterior Nasal Swab     Status: None   Collection Time: 08/30/24 10:46 AM   Specimen: Anterior Nasal Swab  Result Value Ref Range Status   SARS Coronavirus 2 by RT PCR NEGATIVE NEGATIVE Final    Comment: (NOTE) SARS-CoV-2 target nucleic acids are NOT DETECTED.  The SARS-CoV-2 RNA is generally detectable in upper respiratory specimens during the acute phase of infection. The lowest concentration of SARS-CoV-2 viral copies this assay can detect is 138 copies/mL. A  negative result does not preclude SARS-Cov-2 infection and should not be used as the sole basis for treatment or other patient management decisions. A negative result may occur with  improper specimen collection/handling, submission of specimen other than nasopharyngeal swab, presence of viral mutation(s) within the areas targeted by this assay, and inadequate number of viral copies(<138 copies/mL). A negative result must be combined with clinical observations, patient history, and epidemiological information. The expected result is Negative.  Fact Sheet for Patients:  bloggercourse.com  Fact Sheet for Healthcare Providers:  seriousbroker.it  This test is no t yet approved or cleared by the [place]  FDA and  has been authorized for detection and/or diagnosis of SARS-CoV-2 by FDA under an Emergency Use Authorization (EUA). This EUA will remain  in effect (meaning this test can be used) for the duration of the COVID-19 declaration under Section 564(b)(1) of the Act, 21 U.S.C.section 360bbb-3(b)(1), unless the authorization is terminated  or revoked sooner.       Influenza A by PCR NEGATIVE NEGATIVE Final   Influenza B by PCR NEGATIVE NEGATIVE Final    Comment: (NOTE) The Xpert Xpress SARS-CoV-2/FLU/RSV plus assay is intended as an aid in the diagnosis of influenza from Nasopharyngeal swab specimens and should not be used as a sole basis for treatment. Nasal washings and aspirates are unacceptable for Xpert Xpress SARS-CoV-2/FLU/RSV testing.  Fact Sheet for Patients: bloggercourse.com  Fact Sheet for Healthcare Providers: seriousbroker.it  This test is not yet approved or cleared by the [place]  FDA and has been authorized for detection and/or diagnosis of SARS-CoV-2 by FDA under an Emergency Use Authorization (EUA). This EUA will remain in effect (meaning this test can  be used) for the duration of the COVID-19 declaration under Section 564(b)(1) of the Act, 21 U.S.C. section 360bbb-3(b)(1), unless the authorization is terminated or revoked.     Resp Syncytial Virus by PCR NEGATIVE NEGATIVE Final    Comment: (NOTE) Fact Sheet for Patients: bloggercourse.com  Fact Sheet for Healthcare Providers: seriousbroker.it  This test is not yet approved or cleared by the [place]  FDA and has been authorized for detection and/or diagnosis of SARS-CoV-2 by FDA under an Emergency Use Authorization (EUA). This EUA will remain in effect (meaning this test can be used) for the duration of the COVID-19 declaration under Section 564(b)(1) of the Act, 21 U.S.C. section 360bbb-3(b)(1), unless the authorization is terminated or revoked.  Performed at [facility], [address] [place], [place] [address]      Radiology Studies: No results found.   Scheduled Meds:  amLODipine   10 mg Oral Daily   budesonide   0.5 mg Nebulization BID   doxycycline   100 mg Oral Q12H   enoxaparin  (LOVENOX ) injection  40 mg Subcutaneous Daily   guaiFENesin   600 mg Oral BID   ipratropium-albuterol   3 mL Nebulization QID   loratadine   10 mg Oral Daily  methylPREDNISolone  (SOLU-MEDROL ) injection  40 mg Intravenous Q12H   metoprolol  tartrate  50 mg Oral BID   nicotine   14 mg Transdermal Daily   umeclidinium-vilanterol  1 puff Inhalation Daily   Continuous Infusions:   LOS: 2 days   Joette Pebbles,  [facility]  If 7PM-7AM, please contact night-coverage [email or web address]  09/02/2024, 10:10 AM

## 2024-09-03 ENCOUNTER — Other Ambulatory Visit (HOSPITAL_COMMUNITY): Payer: Self-pay

## 2024-09-03 DIAGNOSIS — J441 Chronic obstructive pulmonary disease with (acute) exacerbation: Secondary | ICD-10-CM | POA: Diagnosis not present

## 2024-09-03 LAB — CBC
HCT: 37.8 % (ref 36.0–46.0)
Hemoglobin: 11.4 g/dL — ABNORMAL LOW (ref 12.0–15.0)
MCH: 26.7 pg (ref 26.0–34.0)
MCHC: 30.2 g/dL (ref 30.0–36.0)
MCV: 88.5 fL (ref 80.0–100.0)
Platelets: 285 K/uL (ref 150–400)
RBC: 4.27 MIL/uL (ref 3.87–5.11)
RDW: 14.7 % (ref 11.5–15.5)
WBC: 9.1 K/uL (ref 4.0–10.5)
nRBC: 0 % (ref 0.0–0.2)

## 2024-09-03 LAB — BASIC METABOLIC PANEL WITH GFR
Anion gap: 8 (ref 5–15)
BUN: 15 mg/dL (ref 8–23)
CO2: 28 mmol/L (ref 22–32)
Calcium: 8.1 mg/dL — ABNORMAL LOW (ref 8.9–10.3)
Chloride: 101 mmol/L (ref 98–111)
Creatinine, Ser: 0.74 mg/dL (ref 0.44–1.00)
GFR, Estimated: >60 mL/min (ref >60)
Glucose, Bld: 290 mg/dL — ABNORMAL HIGH (ref 70–99)
Potassium: 4.3 mmol/L (ref 3.5–5.1)
Sodium: 137 mmol/L (ref 135–145)

## 2024-09-03 MED ORDER — LORATADINE 10 MG PO TABS
10.0000 mg | ORAL_TABLET | Freq: Every day | ORAL | 0 refills | Status: AC
  Filled 2024-09-03: qty 30, 30d supply, fill #0

## 2024-09-03 MED ORDER — PREDNISONE 20 MG PO TABS
ORAL_TABLET | ORAL | 0 refills | Status: DC
Start: 2024-09-03 — End: 2024-09-13
  Filled 2024-09-03: qty 18, 30d supply, fill #0

## 2024-09-03 MED ORDER — DOXYCYCLINE HYCLATE 100 MG PO TABS
100.0000 mg | ORAL_TABLET | Freq: Two times a day (BID) | ORAL | 0 refills | Status: DC
  Filled 2024-09-03: qty 10, 5d supply, fill #0

## 2024-09-03 NOTE — Progress Notes (Signed)
 Discharge instructions given to patient and all questions were answered.

## 2024-09-03 NOTE — Progress Notes (Signed)
   09/03/24 1054  TOC Brief Assessment  Insurance and Status Reviewed  Patient has primary care physician Yes  Home environment has been reviewed home w/ spouse  Prior level of function: independent  Prior/Current Home Services No current home services  Social Drivers of Health Review SDOH reviewed no interventions necessary  Readmission risk has been reviewed Yes  Transition of care needs no transition of care needs at this time

## 2024-09-03 NOTE — Progress Notes (Signed)
 Discharge medications delivered to patient at he bedside in a secure bag

## 2024-09-03 NOTE — Discharge Summary (Signed)
 [facility]  Physician Discharge Summary   Patient ID: [name] MRN: [id number] DOB/AGE: 73-Jan-1952 73 y.o.  Admit date: 08/30/2024 Discharge date: 09/03/2024    PCP: Elnora Ip, MD  DISCHARGE DIAGNOSES:    COPD with acute exacerbation [facility]) Essential hypertension Acute respiratory failure with hypoxia, resolved Acute kidney injury, resolved Normocytic anemia Tobacco abuse   RECOMMENDATIONS FOR OUTPATIENT FOLLOW UP: Patient to follow-up with PCP Ambulatory referral sent to pulmonology   Home Health: None Equipment/Devices: None  CODE STATUS: Full code  DISCHARGE CONDITION: fair  Diet recommendation: As before  INITIAL HISTORY: 73 y.o. female with medical history significant for COPD, hypertension, ongoing tobacco abuse being admitted to the hospital with 5 days of progressive congestion, shortness of breath, wheezing due to acute exacerbation of COPD.   HOSPITAL COURSE:   Acute exacerbation of COPD Acute hypoxic respiratory failure, POA Patient admitted with acute exacerbation of COPD. Patient was started on nebulizer treatment and steroids.  Antibiotics were added.  She was not requiring any oxygen .  She was slow to improve but then gradually got better.  She was counseled regarding her tobacco abuse.  Has ambulated in the hallway without difficulty.  Saturations are noted to be normal.  Okay for discharge today.   Essential hypertension Continue home medications   AKI Likely prerenal from relative dehydration Improved with IV fluids.   Elevated troponin Mild elevation noted.- Without symptoms of ACS, likely secondary to above hypoxia and tachycardia   Normocytic anemia No evidence of overt bleeding.  Outpatient workup   Tobacco abuse, chronic, ongoing - Counseled at length at bedside about cessation  Patient is stable.  Feels better.  She wants to go home today.  Okay for discharge.   PERTINENT LABS:  The results of  significant diagnostics from this hospitalization (including imaging, microbiology, ancillary and laboratory) are listed below for reference.    Microbiology: Recent Results (from the past 240 hours)  Resp panel by RT-PCR (RSV, Flu A&B, Covid) Anterior Nasal Swab     Status: None   Collection Time: 08/30/24 10:46 AM   Specimen: Anterior Nasal Swab  Result Value Ref Range Status   SARS Coronavirus 2 by RT PCR NEGATIVE NEGATIVE Final    Comment: (NOTE) SARS-CoV-2 target nucleic acids are NOT DETECTED.  The SARS-CoV-2 RNA is generally detectable in upper respiratory specimens during the acute phase of infection. The lowest concentration of SARS-CoV-2 viral copies this assay can detect is 138 copies/mL. A negative result does not preclude SARS-Cov-2 infection and should not be used as the sole basis for treatment or other patient management decisions. A negative result may occur with  improper specimen collection/handling, submission of specimen other than nasopharyngeal swab, presence of viral mutation(s) within the areas targeted by this assay, and inadequate number of viral copies(<138 copies/mL). A negative result must be combined with clinical observations, patient history, and epidemiological information. The expected result is Negative.  Fact Sheet for Patients:  bloggercourse.com  Fact Sheet for Healthcare Providers:  seriousbroker.it  This test is no t yet approved or cleared by the [place]  FDA and  has been authorized for detection and/or diagnosis of SARS-CoV-2 by FDA under an Emergency Use Authorization (EUA). This EUA will remain  in effect (meaning this test can be used) for the duration of the COVID-19 declaration under Section 564(b)(1) of the Act, 21 U.S.C.section 360bbb-3(b)(1), unless the authorization is terminated  or revoked sooner.       Influenza A by PCR NEGATIVE NEGATIVE  Final   Influenza B by PCR  NEGATIVE NEGATIVE Final    Comment: (NOTE) The Xpert Xpress SARS-CoV-2/FLU/RSV plus assay is intended as an aid in the diagnosis of influenza from Nasopharyngeal swab specimens and should not be used as a sole basis for treatment. Nasal washings and aspirates are unacceptable for Xpert Xpress SARS-CoV-2/FLU/RSV testing.  Fact Sheet for Patients: bloggercourse.com  Fact Sheet for Healthcare Providers: seriousbroker.it  This test is not yet approved or cleared by the [place]  FDA and has been authorized for detection and/or diagnosis of SARS-CoV-2 by FDA under an Emergency Use Authorization (EUA). This EUA will remain in effect (meaning this test can be used) for the duration of the COVID-19 declaration under Section 564(b)(1) of the Act, 21 U.S.C. section 360bbb-3(b)(1), unless the authorization is terminated or revoked.     Resp Syncytial Virus by PCR NEGATIVE NEGATIVE Final    Comment: (NOTE) Fact Sheet for Patients: bloggercourse.com  Fact Sheet for Healthcare Providers: seriousbroker.it  This test is not yet approved or cleared by the [place]  FDA and has been authorized for detection and/or diagnosis of SARS-CoV-2 by FDA under an Emergency Use Authorization (EUA). This EUA will remain in effect (meaning this test can be used) for the duration of the COVID-19 declaration under Section 564(b)(1) of the Act, 21 U.S.C. section 360bbb-3(b)(1), unless the authorization is terminated or revoked.  Performed at [facility], [address], [place], [place] [address]      Labs:   Basic Metabolic Panel: Recent Labs  Lab 08/30/24 0855 08/31/24 0453 09/01/24 0527 09/03/24 0447  NA 141 139 140 137  K 3.6 3.9 3.8 4.3  CL 102 103 103 101  CO2 26 21* 24 28  GLUCOSE 116* 159* 241* 290*  BUN 10 18 16 15   CREATININE 1.27* 1.10* 0.76 0.74  CALCIUM   7.8* 8.8* 8.5* 8.1*  MG 1.8  --   --   --     CBC: Recent Labs  Lab 08/30/24 0855 08/31/24 0453 09/01/24 0527 09/03/24 0447  WBC 5.1 2.7* 6.1 9.1  HGB 12.1 11.7* 10.9* 11.4*  HCT 39.5 39.9 37.2 37.8  MCV 88.8 91.9 91.6 88.5  PLT 304 298 245 285     IMAGING STUDIES DG Chest 2 View Result Date: 08/30/2024 CLINICAL DATA:  SHOB EXAM: CHEST - 2 VIEW COMPARISON:  August 04, 2024 FINDINGS: Emphysema. No focal airspace consolidation, pleural effusion, or pneumothorax. No cardiomegaly. Tortuous aorta with aortic atherosclerosis. No acute fracture or destructive lesions. Multilevel thoracic osteophytosis. Right axillary surgical clips. Cervical fusion hardware. IMPRESSION: Emphysema. No pneumonia or pulmonary edema. Electronically Signed   By: Rogelia Myers M.D.   On: 08/30/2024 08:10    DISCHARGE EXAMINATION: Vitals:   09/02/24 2100 09/03/24 0244 09/03/24 0607 09/03/24 0858  BP: (!) 164/67  134/66   Pulse: 82  77   Resp: 18  20   Temp: 98.7 F (37.1 C)  98.2 F (36.8 C)   TempSrc: Oral  Oral   SpO2: 100% 97% 100% 99%  Weight:      Height:       General appearance: Awake alert.  In no distress Resp: Normal effort at rest with scattered wheezing bilaterally.  No definite crackles. Cardio: S1-S2 is normal regular.  No S3-S4.  No rubs murmurs or bruit GI: Abdomen is soft.  Nontender nondistended.  Bowel sounds are present normal.  No masses organomegaly    DISPOSITION: Home  Discharge Instructions     Call MD  for:  difficulty breathing, headache or visual disturbances   Complete by: As directed    Call MD for:  extreme fatigue   Complete by: As directed    Call MD for:  persistant dizziness or light-headedness   Complete by: As directed    Call MD for:  persistant nausea and vomiting   Complete by: As directed    Call MD for:  severe uncontrolled pain   Complete by: As directed    Call MD for:  temperature >100.4   Complete by: As directed    Diet - low sodium  heart healthy   Complete by: As directed    Discharge instructions   Complete by: As directed    Take your medications as prescribed.  Please stop smoking cigarettes completely.  Take your steroid inhalers on a daily basis and not just as needed.  Please follow-up with your primary care provider within 1 week after discharge.  Referral has been sent to pulmonology.  You were cared for by a hospitalist during your hospital stay. If you have any questions about your discharge medications or the care you received while you were in the hospital after you are discharged, you can call the unit and asked to speak with the hospitalist on call if the hospitalist that took care of you is not available. Once you are discharged, your primary care physician will handle any further medical issues. Please note that NO REFILLS for any discharge medications will be authorized once you are discharged, as it is imperative that you return to your primary care physician (or establish a relationship with a primary care physician if you do not have one) for your aftercare needs so that they can reassess your need for medications and monitor your lab values. If you do not have a primary care physician, you can call [phone] for a physician referral.   Increase activity slowly   Complete by: As directed    Pulmonary Visit   Complete by: As directed    COPD   Reason for referral: Other Pulmonary         Allergies as of 09/03/2024       Reactions   Ace Inhibitors Anaphylaxis, Swelling, Other (See Comments)   Throat swelling  (Re: Lisinopril -hydrochlorothiazide ) Currently taking losartan -HCTZ intermittently with no side effects, previous reaction possibly only to the lisinopril  portion   Chantix  [varenicline ] Nausea And Vomiting   Nicoderm [nicotine ] Nausea And Vomiting        Medication List     TAKE these medications    albuterol  108 (90 Base) MCG/ACT inhaler Commonly known as: VENTOLIN  HFA Inhale 2  puffs into the lungs every 6 (six) hours as needed for wheezing or shortness of breath.   albuterol  (2.5 MG/3ML) 0.083% nebulizer solution Commonly known as: PROVENTIL  Take 3 mLs (2.5 mg total) by nebulization every 4 (four) hours as needed for wheezing or shortness of breath.   amLODipine  10 MG tablet Commonly known as: NORVASC  Take 1 tablet (10 mg total) by mouth daily.   Anoro Ellipta  62.5-25 MCG/ACT Aepb Generic drug: umeclidinium-vilanterol Inhale 1 puff into the lungs daily. What changed:  when to take this reasons to take this   Arnuity Ellipta  100 MCG/ACT Aepb Generic drug: Fluticasone  Furoate Inhale 1 puff into the lungs daily at 8 pm. What changed:  when to take this reasons to take this   doxycycline  100 MG tablet Commonly known as: VIBRA -TABS Take 1 tablet (100 mg total) by mouth every 12 (  twelve) hours for 5 days.   fluticasone  50 MCG/ACT nasal spray Commonly known as: FLONASE  Place 2 sprays into both nostrils daily. What changed:  when to take this reasons to take this   ibuprofen  200 MG tablet Commonly known as: ADVIL  Take 400 mg by mouth 2 (two) times daily as needed for headache (pain).   loratadine  10 MG tablet Commonly known as: CLARITIN  Take 1 tablet (10 mg total) by mouth daily.   losartan -hydrochlorothiazide  50-12.5 MG tablet Commonly known as: HYZAAR Take 1 tablet by mouth 2 (two) times daily. What changed: when to take this   metoprolol  tartrate 50 MG tablet Commonly known as: LOPRESSOR  Take 1 tablet (50 mg total) by mouth 2 (two) times daily. What changed: when to take this   pantoprazole  40 MG tablet Commonly known as: PROTONIX  Take 40 mg by mouth daily as needed (heartburn).   predniSONE  20 MG tablet Commonly known as: DELTASONE  Take 3 tablets once daily for 3 days followed by 2 tablets once daily for 3 days followed by 1 tablet once daily for 3 days and then stop          Follow-up Information     Elnora Ip,  MD. Schedule an appointment as soon as possible for a visit in 1 week(s).   Specialty: Internal Medicine Why: post hospitalization follow up Contact information: [address] [place] [address] [phone]                 TOTAL DISCHARGE TIME: 35 minutes  Neilson Oehlert Verdene  [facility] Pager on [email or web address]  09/04/2024, 10:11 AM

## 2024-09-04 ENCOUNTER — Telehealth: Payer: Self-pay

## 2024-09-04 NOTE — Transitions of Care (Post Inpatient/ED Visit) (Signed)
 09/04/2024  Name: [name] MRN: [id number] DOB: 03/19/1951  Today's TOC FU Call Status: Today's TOC FU Call Status:: Successful TOC FU Call Completed TOC FU Call Complete Date: 09/04/24 Patient's Name and Date of Birth confirmed.  Transition Care Management Follow-up Telephone Call Date of Discharge: 09/03/24 Discharge Facility: [facility]) Type of Discharge: Inpatient Admission Primary Inpatient Discharge Diagnosis:: COPD Exacerbation How have you been since you were released from the hospital?: Better Any questions or concerns?: No  Items Reviewed: Did you receive and understand the discharge instructions provided?: Yes Medications obtained,verified, and reconciled?: Yes (Medications Reviewed) Any new allergies since your discharge?: No Dietary orders reviewed?: Yes Type of Diet Ordered:: Low Sodium Heart Healthy Do you have support at home?: Yes People in Home [RPT]: spouse Name of Support/Comfort Primary Source: [name]  Medications Reviewed Today: Medications Reviewed Today     Reviewed by [name], RN (Case Manager) on 09/04/24 at 1250  Med List Status: <None>   Medication Order Taking? Sig Documenting Provider Last Dose Status Informant  albuterol  (PROVENTIL ) (2.5 MG/3ML) 0.083% nebulizer solution [id number] No Take 3 mLs (2.5 mg total) by nebulization every 4 (four) hours as needed for wheezing or shortness of breath. Gomez-Caraballo, Maria, MD Past Week Active Self, Pharmacy Records  albuterol  (VENTOLIN  HFA) 108 [phone] Base) MCG/ACT inhaler [id number] No Inhale 2 puffs into the lungs every 6 (six) hours as needed for wheezing or shortness of breath. Jolaine Pac, DO Past Week Active Self, Pharmacy Records  amLODipine  (NORVASC ) 10 MG tablet [id number] No Take 1 tablet (10 mg total) by mouth daily. Fernand Prost, MD Past Week Active Self, Pharmacy Records           Med Note (COFFELL, JON HERO   Fri Aug 31, 2024 11:36 AM) Patient admits  non-compliance and only takes 2-3 times a week.  doxycycline  (VIBRA -TABS) 100 MG tablet [id number]  Take 1 tablet (100 mg total) by mouth every 12 (twelve) hours for 5 days. Krishnan, Gokul, MD  Active   fluticasone  (FLONASE ) 50 MCG/ACT nasal spray [id number] No Place 2 sprays into both nostrils daily.  Patient taking differently: Place 2 sprays into both nostrils daily as needed.   Sebastian Toribio GAILS, MD Past Week Active Self, Pharmacy Records  Fluticasone  Furoate (ARNUITY ELLIPTA ) 100 MCG/ACT AEPB [id number] No Inhale 1 puff into the lungs daily at 8 pm.  Patient taking differently: Inhale 1 puff into the lungs daily as needed.   Lue Elsie BROCKS, MD Past Week Active Self, Pharmacy Records  ibuprofen  (ADVIL ) 200 MG tablet [id number] No Take 400 mg by mouth 2 (two) times daily as needed for headache (pain). [provider] Past Week Active Self, Pharmacy Records  loratadine  (CLARITIN ) 10 MG tablet [id number]  Take 1 tablet (10 mg total) by mouth daily. Krishnan, Gokul, MD  Active   losartan -hydrochlorothiazide  [facility]) 50-12.5 MG tablet [id number] No Take 1 tablet by mouth 2 (two) times daily.  Patient taking differently: Take 1 tablet by mouth daily.   Gomez-Caraballo, Maria, MD Past Week Active Self, Pharmacy Records           Med Note (COFFELL, JON HERO   Fri Aug 31, 2024 11:36 AM) Patient admits non-compliance and only takes 2-3 times a week.  metoprolol  tartrate (LOPRESSOR ) 50 MG tablet [id number] No Take 1 tablet (50 mg total) by mouth 2 (two) times daily.  Patient taking differently: Take 50 mg by mouth daily.   Vann, Jessica U, DO Past Week Active Self, Pharmacy  Records           Med Note (COFFELL, JON HERO   Fri Aug 31, 2024 11:36 AM) Patient admits non-compliance and only takes 2-3 times a week.  pantoprazole  (PROTONIX ) 40 MG tablet [id number] No Take 40 mg by mouth daily as needed (heartburn). [provider] Past Week Active Self, Pharmacy Records  predniSONE   (DELTASONE ) 20 MG tablet [id number]  Take 3 tablets once daily for 3 days followed by 2 tablets once daily for 3 days followed by 1 tablet once daily for 3 days and then stop Krishnan, Gokul, MD  Active   umeclidinium-vilanterol (ANORO ELLIPTA ) 62.5-25 MCG/ACT AEPB [id number] No Inhale 1 puff into the lungs daily.  Patient taking differently: Inhale 1 puff into the lungs daily as needed (wheezing, shortness of breath).   Lue Elsie BROCKS, MD Past Week Active Self, Pharmacy Records            Home Care and Equipment/Supplies: Were Home Health Services Ordered?: NA Any new equipment or medical supplies ordered?: NA  Functional Questionnaire: Do you need assistance with bathing/showering or dressing?: No Do you need assistance with meal preparation?: No Do you need assistance with eating?: No Do you have difficulty maintaining continence: No Do you need assistance with getting out of bed/getting out of a chair/moving?: No Do you have difficulty managing or taking your medications?: No  Follow up appointments reviewed: PCP Follow-up appointment confirmed?: No (Sent a message to [facility]) MD Provider Line Number:[phone] Given: No Specialist Hospital Follow-up appointment confirmed?: No (A referral has been sent to [facility]) Reason Specialist Follow-Up Not Confirmed: Patient has Specialist Provider Number and will Call for Appointment Do you need transportation to your follow-up appointment?: No Do you understand care options if your condition(s) worsen?: Yes-patient verbalized understanding  SDOH Interventions Today    Flowsheet Row Most Recent Value  SDOH Interventions   Food Insecurity Interventions Intervention Not Indicated  Housing Interventions Intervention Not Indicated  Transportation Interventions Intervention Not Indicated  Utilities Interventions Intervention Not Indicated    Goals Addressed             This Visit's Progress    VBCI Transitions of Care (TOC)  Care Plan       Problems:  Recent Hospitalization for treatment of COPD No Hospital Follow Up Provider appointment The patient has been non compliant in attending her PCP appointment and other specialist appointments  and Hospital or ED Adm Risk of 59%  Goal:  Over the next 30 days, the patient will not experience hospital readmission  Interventions:   COPD Interventions: Advised patient to track and manage COPD triggers Assessed social determinant of health barriers Discussed the importance of adequate rest and management of fatigue with COPD Provided education about and advised patient to utilize infection prevention strategies to reduce risk of respiratory infection Provided instruction about proper use of medications used for management of COPD including inhalers Screening for signs and symptoms of depression related to chronic disease state  Follow up with Pulmonology referral  Encourage smoking cessation or cut down to less than 5 cigarettes a day  Patient Self Care Activities:  Attend all scheduled provider appointments Call pharmacy for medication refills 3-7 days in advance of running out of medications Call provider office for new concerns or questions  Notify RN Care Manager of TOC call rescheduling needs Participate in Transition of Care Program/Attend TOC scheduled calls Perform all self care activities independently  Take medications as prescribed    Plan:  Telephone follow up appointment with care management team member scheduled for:  Monday November 3rd at 2:00pm         [facility], BSN, RN [place]  VBCI - [facility] RN Care Manager [phone]

## 2024-09-04 NOTE — Patient Instructions (Signed)
 Visit Information  Thank you for taking time to visit with me today. Please don't hesitate to contact me if I can be of assistance to you before our next scheduled telephone appointment.  Our next appointment is by telephone on Monday November 3rd at 2:00pm  Following is a copy of your care plan:   Goals Addressed             This Visit's Progress    VBCI Transitions of Care (TOC) Care Plan       Problems:  Recent Hospitalization for treatment of COPD No Hospital Follow Up Provider appointment The patient has been non compliant in attending her PCP appointment and other specialist appointments  and Hospital or ED Adm Risk of 59%  Goal:  Over the next 30 days, the patient will not experience hospital readmission  Interventions:   COPD Interventions: Advised patient to track and manage COPD triggers Assessed social determinant of health barriers Discussed the importance of adequate rest and management of fatigue with COPD Provided education about and advised patient to utilize infection prevention strategies to reduce risk of respiratory infection Provided instruction about proper use of medications used for management of COPD including inhalers Screening for signs and symptoms of depression related to chronic disease state  Follow up with Pulmonology referral  Encourage smoking cessation or cut down to less than 5 cigarettes a day  Patient Self Care Activities:  Attend all scheduled provider appointments Call pharmacy for medication refills 3-7 days in advance of running out of medications Call provider office for new concerns or questions  Notify RN Care Manager of [facility] call rescheduling needs Participate in Transition of Care Program/Attend [facility] scheduled calls Perform all self care activities independently  Take medications as prescribed    Plan:  Telephone follow up appointment with care management team member scheduled for:  Monday November 3rd at 2:00pm         Patient verbalizes understanding of instructions and care plan provided today and agrees to view in MyChart. Active MyChart status and patient understanding of how to access instructions and care plan via MyChart confirmed with patient.     The patient has been provided with contact information for the care management team and has been advised to call with any health related questions or concerns.   Please call the care guide team at [phone] if you need to cancel or reschedule your appointment.   Please call the Suicide and Crisis Lifeline: 988 call the [place]  National Suicide Prevention Lifeline: 813-810-3819 or TTY: 941-135-4626 TTY 501-605-4325) to talk to a trained counselor if you are experiencing a Mental Health or Behavioral Health Crisis or need someone to talk to.  Medford Balboa, BSN, RN [place]  VBCI - [employer] [facility] RN Care Manager [phone]

## 2024-09-05 ENCOUNTER — Inpatient Hospital Stay (HOSPITAL_COMMUNITY)
Admission: EM | Admit: 2024-09-05 | Discharge: 2024-09-13 | DRG: 191 | Disposition: A | Discharge status: Home Health | Attending: Internal Medicine | Admitting: Internal Medicine

## 2024-09-05 ENCOUNTER — Other Ambulatory Visit: Payer: Self-pay

## 2024-09-05 ENCOUNTER — Encounter (HOSPITAL_COMMUNITY): Payer: Self-pay

## 2024-09-05 ENCOUNTER — Emergency Department (HOSPITAL_COMMUNITY)

## 2024-09-05 DIAGNOSIS — Z853 Personal history of malignant neoplasm of breast: Secondary | ICD-10-CM

## 2024-09-05 DIAGNOSIS — M19041 Primary osteoarthritis, right hand: Secondary | ICD-10-CM | POA: Diagnosis present

## 2024-09-05 DIAGNOSIS — J441 Chronic obstructive pulmonary disease with (acute) exacerbation: Principal | ICD-10-CM | POA: Diagnosis present

## 2024-09-05 DIAGNOSIS — E118 Type 2 diabetes mellitus with unspecified complications: Secondary | ICD-10-CM | POA: Diagnosis present

## 2024-09-05 DIAGNOSIS — I110 Hypertensive heart disease with heart failure: Secondary | ICD-10-CM | POA: Diagnosis present

## 2024-09-05 DIAGNOSIS — Z90710 Acquired absence of both cervix and uterus: Secondary | ICD-10-CM

## 2024-09-05 DIAGNOSIS — F32A Depression, unspecified: Secondary | ICD-10-CM | POA: Diagnosis present

## 2024-09-05 DIAGNOSIS — I421 Obstructive hypertrophic cardiomyopathy: Secondary | ICD-10-CM | POA: Diagnosis present

## 2024-09-05 DIAGNOSIS — Z6830 Body mass index (BMI) 30.0-30.9, adult: Secondary | ICD-10-CM | POA: Diagnosis not present

## 2024-09-05 DIAGNOSIS — E1165 Type 2 diabetes mellitus with hyperglycemia: Secondary | ICD-10-CM | POA: Diagnosis present

## 2024-09-05 DIAGNOSIS — E876 Hypokalemia: Secondary | ICD-10-CM | POA: Diagnosis present

## 2024-09-05 DIAGNOSIS — R059 Cough, unspecified: Secondary | ICD-10-CM | POA: Diagnosis not present

## 2024-09-05 DIAGNOSIS — Z713 Dietary counseling and surveillance: Secondary | ICD-10-CM | POA: Diagnosis not present

## 2024-09-05 DIAGNOSIS — Z1152 Encounter for screening for COVID-19: Secondary | ICD-10-CM | POA: Diagnosis not present

## 2024-09-05 DIAGNOSIS — R000 Tachycardia, unspecified: Secondary | ICD-10-CM | POA: Diagnosis not present

## 2024-09-05 DIAGNOSIS — K219 Gastro-esophageal reflux disease without esophagitis: Secondary | ICD-10-CM | POA: Diagnosis present

## 2024-09-05 DIAGNOSIS — I5032 Chronic diastolic (congestive) heart failure: Secondary | ICD-10-CM | POA: Diagnosis present

## 2024-09-05 DIAGNOSIS — Z923 Personal history of irradiation: Secondary | ICD-10-CM | POA: Diagnosis not present

## 2024-09-05 DIAGNOSIS — I3139 Other pericardial effusion (noninflammatory): Secondary | ICD-10-CM | POA: Diagnosis present

## 2024-09-05 DIAGNOSIS — F419 Anxiety disorder, unspecified: Secondary | ICD-10-CM | POA: Diagnosis present

## 2024-09-05 DIAGNOSIS — E66811 Obesity, class 1: Secondary | ICD-10-CM | POA: Diagnosis present

## 2024-09-05 DIAGNOSIS — Z91128 Patient's intentional underdosing of medication regimen for other reason: Secondary | ICD-10-CM

## 2024-09-05 DIAGNOSIS — R918 Other nonspecific abnormal finding of lung field: Secondary | ICD-10-CM | POA: Diagnosis not present

## 2024-09-05 DIAGNOSIS — J432 Centrilobular emphysema: Secondary | ICD-10-CM | POA: Diagnosis not present

## 2024-09-05 DIAGNOSIS — I10 Essential (primary) hypertension: Secondary | ICD-10-CM | POA: Diagnosis present

## 2024-09-05 DIAGNOSIS — E7849 Other hyperlipidemia: Secondary | ICD-10-CM | POA: Diagnosis present

## 2024-09-05 DIAGNOSIS — E1169 Type 2 diabetes mellitus with other specified complication: Secondary | ICD-10-CM | POA: Diagnosis present

## 2024-09-05 DIAGNOSIS — I700 Atherosclerosis of aorta: Secondary | ICD-10-CM | POA: Diagnosis not present

## 2024-09-05 DIAGNOSIS — F17200 Nicotine dependence, unspecified, uncomplicated: Secondary | ICD-10-CM | POA: Diagnosis present

## 2024-09-05 DIAGNOSIS — R0609 Other forms of dyspnea: Secondary | ICD-10-CM | POA: Diagnosis not present

## 2024-09-05 DIAGNOSIS — Z803 Family history of malignant neoplasm of breast: Secondary | ICD-10-CM

## 2024-09-05 DIAGNOSIS — Z888 Allergy status to other drugs, medicaments and biological substances status: Secondary | ICD-10-CM

## 2024-09-05 DIAGNOSIS — M19042 Primary osteoarthritis, left hand: Secondary | ICD-10-CM | POA: Diagnosis present

## 2024-09-05 DIAGNOSIS — Z981 Arthrodesis status: Secondary | ICD-10-CM

## 2024-09-05 DIAGNOSIS — Z716 Tobacco abuse counseling: Secondary | ICD-10-CM

## 2024-09-05 DIAGNOSIS — Z9221 Personal history of antineoplastic chemotherapy: Secondary | ICD-10-CM | POA: Diagnosis not present

## 2024-09-05 DIAGNOSIS — F17210 Nicotine dependence, cigarettes, uncomplicated: Secondary | ICD-10-CM | POA: Diagnosis present

## 2024-09-05 DIAGNOSIS — E6609 Other obesity due to excess calories: Secondary | ICD-10-CM | POA: Diagnosis present

## 2024-09-05 DIAGNOSIS — J209 Acute bronchitis, unspecified: Secondary | ICD-10-CM | POA: Diagnosis not present

## 2024-09-05 DIAGNOSIS — Z7951 Long term (current) use of inhaled steroids: Secondary | ICD-10-CM

## 2024-09-05 DIAGNOSIS — R0602 Shortness of breath: Secondary | ICD-10-CM | POA: Diagnosis not present

## 2024-09-05 DIAGNOSIS — Z79899 Other long term (current) drug therapy: Secondary | ICD-10-CM

## 2024-09-05 LAB — CBC
HCT: 37.1 % (ref 36.0–46.0)
Hemoglobin: 11.5 g/dL — ABNORMAL LOW (ref 12.0–15.0)
MCH: 26.8 pg (ref 26.0–34.0)
MCHC: 31 g/dL (ref 30.0–36.0)
MCV: 86.5 fL (ref 80.0–100.0)
Platelets: 292 K/uL (ref 150–400)
RBC: 4.29 MIL/uL (ref 3.87–5.11)
RDW: 14.9 % (ref 11.5–15.5)
WBC: 8.9 K/uL (ref 4.0–10.5)
nRBC: 0 % (ref 0.0–0.2)

## 2024-09-05 LAB — RESP PANEL BY RT-PCR (RSV, FLU A&B, COVID)  RVPGX2
Influenza A by PCR: NEGATIVE
Influenza B by PCR: NEGATIVE
Resp Syncytial Virus by PCR: NEGATIVE
SARS Coronavirus 2 by RT PCR: NEGATIVE

## 2024-09-05 LAB — BASIC METABOLIC PANEL WITH GFR
Anion gap: 10 (ref 5–15)
BUN: 7 mg/dL — ABNORMAL LOW (ref 8–23)
CO2: 28 mmol/L (ref 22–32)
Calcium: 8.4 mg/dL — ABNORMAL LOW (ref 8.9–10.3)
Chloride: 102 mmol/L (ref 98–111)
Creatinine, Ser: 0.71 mg/dL (ref 0.44–1.00)
GFR, Estimated: >60 mL/min (ref >60)
Glucose, Bld: 140 mg/dL — ABNORMAL HIGH (ref 70–99)
Potassium: 3.4 mmol/L — ABNORMAL LOW (ref 3.5–5.1)
Sodium: 140 mmol/L (ref 135–145)

## 2024-09-05 MED ORDER — IPRATROPIUM-ALBUTEROL 0.5-2.5 (3) MG/3ML IN SOLN
3.0000 mL | Freq: Once | RESPIRATORY_TRACT | Status: AC
  Administered 2024-09-05: 3 mL via RESPIRATORY_TRACT
  Filled 2024-09-05: qty 3

## 2024-09-05 MED ORDER — AZITHROMYCIN 250 MG PO TABS
500.0000 mg | ORAL_TABLET | Freq: Every day | ORAL | Status: AC
  Administered 2024-09-06 – 2024-09-07 (×2): 500 mg via ORAL
  Filled 2024-09-05 (×2): qty 2

## 2024-09-05 MED ORDER — SODIUM CHLORIDE 0.9 % IV SOLN
500.0000 mg | INTRAVENOUS | Status: AC
  Administered 2024-09-06: 500 mg via INTRAVENOUS
  Filled 2024-09-05: qty 5

## 2024-09-05 MED ORDER — ACETAMINOPHEN 325 MG PO TABS
650.0000 mg | ORAL_TABLET | Freq: Four times a day (QID) | ORAL | Status: DC | PRN
  Administered 2024-09-07: 650 mg via ORAL
  Filled 2024-09-05 (×2): qty 2

## 2024-09-05 MED ORDER — METHYLPREDNISOLONE SODIUM SUCC 125 MG IJ SOLR
125.0000 mg | Freq: Once | INTRAMUSCULAR | Status: AC
  Administered 2024-09-05: 125 mg via INTRAVENOUS
  Filled 2024-09-05: qty 2

## 2024-09-05 MED ORDER — SENNOSIDES-DOCUSATE SODIUM 8.6-50 MG PO TABS: 1.0000 | ORAL_TABLET | Freq: Every evening | ORAL | Status: DC | PRN

## 2024-09-05 MED ORDER — METHYLPREDNISOLONE SODIUM SUCC 125 MG IJ SOLR
125.0000 mg | Freq: Two times a day (BID) | INTRAMUSCULAR | Status: AC
  Administered 2024-09-06 (×2): 125 mg via INTRAVENOUS
  Filled 2024-09-05 (×2): qty 2

## 2024-09-05 MED ORDER — PANTOPRAZOLE SODIUM 40 MG PO TBEC: 40.0000 mg | DELAYED_RELEASE_TABLET | Freq: Every day | ORAL | Status: DC | PRN

## 2024-09-05 MED ORDER — SODIUM CHLORIDE 0.9% FLUSH
3.0000 mL | Freq: Two times a day (BID) | INTRAVENOUS | Status: DC
  Administered 2024-09-06 – 2024-09-13 (×15): 3 mL via INTRAVENOUS

## 2024-09-05 MED ORDER — PROCHLORPERAZINE EDISYLATE 10 MG/2ML IJ SOLN: 5.0000 mg | Freq: Four times a day (QID) | INTRAMUSCULAR | Status: DC | PRN

## 2024-09-05 MED ORDER — POTASSIUM CHLORIDE CRYS ER 20 MEQ PO TBCR
20.0000 meq | EXTENDED_RELEASE_TABLET | Freq: Once | ORAL | Status: AC
  Administered 2024-09-06: 20 meq via ORAL
  Filled 2024-09-05: qty 1

## 2024-09-05 MED ORDER — ENOXAPARIN SODIUM 40 MG/0.4ML IJ SOSY
40.0000 mg | PREFILLED_SYRINGE | INTRAMUSCULAR | Status: DC
  Administered 2024-09-06 – 2024-09-13 (×8): 40 mg via SUBCUTANEOUS
  Filled 2024-09-05 (×8): qty 0.4

## 2024-09-05 MED ORDER — METOPROLOL TARTRATE 50 MG PO TABS
50.0000 mg | ORAL_TABLET | Freq: Two times a day (BID) | ORAL | Status: DC
  Administered 2024-09-06 – 2024-09-13 (×16): 50 mg via ORAL
  Filled 2024-09-05 (×6): qty 1
  Filled 2024-09-05: qty 2
  Filled 2024-09-05 (×5): qty 1
  Filled 2024-09-05: qty 2
  Filled 2024-09-05 (×3): qty 1

## 2024-09-05 MED ORDER — GUAIFENESIN 100 MG/5ML PO LIQD
5.0000 mL | ORAL | Status: DC | PRN
  Administered 2024-09-06 (×2): 5 mL via ORAL
  Filled 2024-09-05 (×2): qty 10

## 2024-09-05 MED ORDER — PREDNISONE 20 MG PO TABS: 40.0000 mg | ORAL_TABLET | Freq: Every day | ORAL | Status: DC

## 2024-09-05 MED ORDER — ACETAMINOPHEN 650 MG RE SUPP: 650.0000 mg | Freq: Four times a day (QID) | RECTAL | Status: DC | PRN

## 2024-09-05 MED ORDER — IPRATROPIUM-ALBUTEROL 0.5-2.5 (3) MG/3ML IN SOLN
3.0000 mL | RESPIRATORY_TRACT | Status: DC | PRN
  Administered 2024-09-06: 3 mL via RESPIRATORY_TRACT
  Filled 2024-09-05: qty 3

## 2024-09-05 MED ORDER — ALBUTEROL SULFATE (2.5 MG/3ML) 0.083% IN NEBU
10.0000 mg | INHALATION_SOLUTION | Freq: Once | RESPIRATORY_TRACT | Status: AC
  Administered 2024-09-05: 10 mg via RESPIRATORY_TRACT
  Filled 2024-09-05: qty 12

## 2024-09-05 MED ORDER — AMLODIPINE BESYLATE 10 MG PO TABS
10.0000 mg | ORAL_TABLET | Freq: Every day | ORAL | Status: DC
  Administered 2024-09-06 – 2024-09-13 (×8): 10 mg via ORAL
  Filled 2024-09-05 (×4): qty 1
  Filled 2024-09-05: qty 2
  Filled 2024-09-05 (×3): qty 1

## 2024-09-05 MED ORDER — LOSARTAN POTASSIUM-HCTZ 50-12.5 MG PO TABS: 1.0000 | ORAL_TABLET | Freq: Every day | ORAL | Status: DC

## 2024-09-05 NOTE — ED Provider Notes (Signed)
 [facility] Emergency Department Provider Note MRN:  [id number]  Arrival date & time: 09/05/24     Chief Complaint   Shortness of Breath   History of Present Illness   [name] is a 73 y.o. year-old female presents to the ED with chief complaint of COPD exacerbation.  States that she was recently admitted for the same, but went home too soon.  States that her wheezing has worsened since going home.  She states that she has had dry cough, nasal congestion, but denies any fever or chills.  She states that she has been using her home inhaler/nebulizer, but without much relief.  History provided by patient.   Review of Systems  Pertinent positive and negative review of systems noted in HPI.    Physical Exam   Vitals:   09/05/24 2122 09/05/24 2311  BP: 90/77 (!) 173/79  Pulse: (!) 118 (!) 102  Resp: (!) 22 (!) 21  Temp: 98.2 F (36.8 C) 98.1 F (36.7 C)  SpO2: 97% 100%    CONSTITUTIONAL:  non toxic-appearing, NAD NEURO:  Alert and oriented x 3, CN 3-12 grossly intact EYES:  eyes equal and reactive ENT/NECK:  Supple, no stridor  CARDIO:  tachycardic, regular rhythm, appears well-perfused  PULM:  mild respiratory distress, diffuse wheezing throughout GI/GU:  non-distended,  MSK/SPINE:  No gross deformities, no edema, moves all extremities  SKIN:  no rash, atraumatic   *Additional and/or pertinent findings included in MDM below  Diagnostic and Interventional Summary    EKG Interpretation Date/Time:  Wednesday September 05 2024 21:22:37 EDT Ventricular Rate:  113 PR Interval:  138 QRS Duration:  85 QT Interval:  340 QTC Calculation: 467 R Axis:   64  Text Interpretation: Sinus tachycardia Ventricular premature complex Biatrial enlargement Probable anteroseptal infarct, old Nonspecific T abnormalities, inferior leads When compared with ECG of 08/30/2024, Premature ventricular complexes are no longer present Confirmed by Raford Lenis  ([id number]) on 09/05/2024 10:45:09 PM       Labs Reviewed  BASIC METABOLIC PANEL WITH GFR - Abnormal; Notable for the following components:      Result Value   Potassium 3.4 (*)    Glucose, Bld 140 (*)    BUN 7 (*)    Calcium  8.4 (*)    All other components within normal limits  CBC - Abnormal; Notable for the following components:   Hemoglobin 11.5 (*)    All other components within normal limits  RESP PANEL BY RT-PCR (RSV, FLU A&B, COVID)  [id number]    DG Chest 2 View  Final Result      Medications  albuterol  (PROVENTIL ) (2.5 MG/3ML) 0.083% nebulizer solution 10 mg (has no administration in time range)  methylPREDNISolone  sodium succinate (SOLU-MEDROL ) 125 mg/2 mL injection 125 mg (125 mg Intravenous Given 09/05/24 2252)  ipratropium-albuterol  (DUONEB) 0.5-2.5 (3) MG/3ML nebulizer solution 3 mL (3 mLs Nebulization Given 09/05/24 2253)  ipratropium-albuterol  (DUONEB) 0.5-2.5 (3) MG/3ML nebulizer solution 3 mL (3 mLs Nebulization Given 09/05/24 2253)     Procedures  /  Critical Care .Critical Care  Performed by: Vicky Charleston, PA-C Authorized by: Vicky Charleston, PA-C   Critical care provider statement:    Critical care time (minutes):  48   Critical care was necessary to treat or prevent imminent or life-threatening deterioration of the following conditions:  Respiratory failure   Critical care was time spent personally by me on the following activities:  Development of treatment plan with patient or surrogate, discussions with consultants,  evaluation of patient's response to treatment, examination of patient, ordering and review of laboratory studies, ordering and review of radiographic studies, ordering and performing treatments and interventions, pulse oximetry, re-evaluation of patient's condition and review of old charts   ED Course and Medical Decision Making  I have reviewed the triage vital signs, the nursing notes, and pertinent available records from the  EMR.  Social Determinants Affecting Complexity of Care: Patient has no clinically significant social determinants affecting this chief complaint..   ED Course:    Medical Decision Making Patient here for shortness of breath and wheezing.  Has history of COPD.  States that she was just admitted on 10/23, had 4-day hospital stay and was released.  She states that she was given the option to go home, but was told that she could have stayed in the hospital a little longer.  She states that she worsened since going home.  She has diffuse wheezing.  She is mildly tachycardic, but not hypoxic.  She has not had much improvement despite 2 nebulizer treatments and has started on a continuous nebulizer.  Will consult hospitalist for admission.  COVID and flu are negative.  No evidence of pneumonia on chest x-ray.  No leukocytosis.  Patient also treated with Solu-Medrol .  Amount and/or Complexity of Data Reviewed Labs: ordered. Radiology: ordered.  Risk Prescription drug management. Decision regarding hospitalization.         Consultants: I consulted with Hospitalist, Dr. Charlton, who is appreciated for admitting.   Treatment and Plan: Patient's exam and diagnostic results are concerning for COPD exacerbation.  Feel that patient will need admission to the hospital for further treatment and evaluation.    Final Clinical Impressions(s) / ED Diagnoses     ICD-10-CM   1. COPD exacerbation [facility])  J44.1       ED Discharge Orders     None         Discharge Instructions Discussed with and Provided to Patient:   Discharge Instructions   None      Vicky Charleston, PA-C 09/05/24 2335    Raford Lenis, MD 09/06/24 [phone]

## 2024-09-05 NOTE — H&P (Signed)
 History and Physical    [name] FMW:[id number] DOB: 03-Jul-1951 DOA: 09/05/2024  PCP: Elnora Ip, MD   Patient coming from: Home   Chief Complaint: SOB   HPI: [name] is a 73 y.o. female with medical history significant for HTN, COPD, and recent admission for COPD exacerbation who presents with worsening SOB.   Patient relates that she had not fully recovered by time of the recent hospital discharge but she had improved significantly and was eager to return home.  She began to feel more short of breath again yesterday and has been dyspneic at rest today despite using her inhalers at home.  She continues to have frequent productive coughing.  She denies chest pain, fever, or chills.  ED Course: Upon arrival to the ED, patient is found to be afebrile and saturating well on room air with tachypnea, tachycardia, and stable BP. Labs are notable for potassium 3.4, normal creatinine, and normal WBC. CXR demonstrates hyperinflation and bronchial thickening.   She was treated with 125 mg IV Solu-Medrol , 10 mg continuous albuterol  treatment, and DuoNeb x2 in the ED.   Review of Systems:  All other systems reviewed and apart from HPI, are negative.  Past Medical History:  Diagnosis Date   Acute sinusitis 01/09/2019   Acute sinusitis 01/09/2019   Anxiety    Arthritis    fingers, knees (08/16/2018)   Asthma    Atopic dermatitis 03/20/2018   Axillary hidradenitis suppurativa 10/13/2018   Cancer of right breast (HCC) 1991   s/p lumpectomy, chemotherapy and radiation therapy in 1991. Mammogram in 2007 was normal.   Cellulitis of buttock, left 12/13/2022   Constipated    h/o   COPD (chronic obstructive pulmonary disease) (HCC)    History of multiple hospital admissions for exercabation    COPD exacerbation (HCC) 01/01/2019   COPD exacerbation (HCC) 02/17/2024   COPD with acute exacerbation (HCC) 01/14/2019   COPD with exacerbation (HCC) 04/06/2009    Qualifier: Diagnosis of  By: Loletta MD, Vijay     Depression    Diarrhea    h/o   Facial cellulitis 08/30/2022   Facial edema 08/31/2022   GERD (gastroesophageal reflux disease)    Grade I diastolic dysfunction 01/12/2024   Headache    a few times/month (08/16/2018   Heart murmur 10/05/2011   first time I ever heard I had one was today   History of breast cancer 12/01/2012   Pt with h/o breast CA s/p lumpectomy with chemo/radiation in 1991. Pt mammogram 2011 was unremarkable. Had CT chest 10/2012 in ED for SOB and showed spiculated nodule with lymph node. 12/04/12: Birads 2; repeat diagnostic mammogram in 1 year.     Hyperlipidemia    Hypertension    Lower extremity edema 09/21/2018   Obesity    Personal history of chemotherapy    Personal history of radiation therapy    Pneumonia    couple times in the last 10-15 yrs (08/16/2018)   QT prolongation 08/08/2014   Seasonal allergies 02/25/2017   Shortness of breath 10/05/2011   at rest; lying down; w/exertion   Sigmoid diverticulitis 80/2008   Tobacco abuse    Type 2 diabetes mellitus (HCC) 05/14/2009   Type II diabetes mellitus (HCC)     Past Surgical History:  Procedure Laterality Date   ABDOMINAL HYSTERECTOMY     ANTERIOR CERVICAL DECOMP/DISCECTOMY FUSION  2012   Dr. Beuford  put plate in; did something to my vertebrae   BACK SURGERY  BREAST LUMPECTOMY Right 1991   DOBUTAMINE  STRESS ECHO  08/2004   Inferior ischemia, normal LV systolic function, no significant CAD   MINOR IRRIGATION AND DEBRIDEMENT OF WOUND Right 09/04/2022   Procedure: IRRIGATION AND DEBRIDEMENT OF NASAL WOUND;  Surgeon: Luciano Standing, MD;  Location: [facility] OR;  Service: ENT;  Laterality: Right;    Social History:   reports that she has been smoking cigarettes. She has a 22.5 pack-year smoking history. She has never used smokeless tobacco. She reports current alcohol  use of about 4.0 standard drinks of alcohol  per week. She reports that she  does not use drugs.  Allergies  Allergen Reactions   Ace Inhibitors Anaphylaxis, Swelling and Other (See Comments)    Throat swelling  (Re: Lisinopril -hydrochlorothiazide )  Currently taking losartan -HCTZ intermittently with no side effects, previous reaction possibly only to the lisinopril  portion   Chantix  [Varenicline ] Nausea And Vomiting   Nicoderm [Nicotine ] Nausea And Vomiting    Family History  Problem Relation Age of Onset   Cancer Mother        unknown   Breast cancer Daughter 47   Breast cancer Daughter        68s   BRCA 1/2 Neg Hx      Prior to Admission medications   Medication Sig Start Date End Date Taking? Authorizing Provider  albuterol  (PROVENTIL ) (2.5 MG/3ML) 0.083% nebulizer solution Take 3 mLs (2.5 mg total) by nebulization every 4 (four) hours as needed for wheezing or shortness of breath. 04/21/24   Elnora Ip, MD  albuterol  (VENTOLIN  HFA) 108 [phone] Base) MCG/ACT inhaler Inhale 2 puffs into the lungs every 6 (six) hours as needed for wheezing or shortness of breath. 01/10/24   Jolaine Pac, DO  amLODipine  (NORVASC ) 10 MG tablet Take 1 tablet (10 mg total) by mouth daily. 03/19/24 03/19/25  Fernand Prost, MD  doxycycline  (VIBRA -TABS) 100 MG tablet Take 1 tablet (100 mg total) by mouth every 12 (twelve) hours for 5 days. 09/03/24 09/08/24  Krishnan, Gokul, MD  fluticasone  (FLONASE ) 50 MCG/ACT nasal spray Place 2 sprays into both nostrils daily. Patient taking differently: Place 2 sprays into both nostrils daily as needed. 05/30/24   Sebastian Toribio GAILS, MD  Fluticasone  Furoate (ARNUITY ELLIPTA ) 100 MCG/ACT AEPB Inhale 1 puff into the lungs daily at 8 pm. Patient taking differently: Inhale 1 puff into the lungs daily as needed. 07/14/24 09/12/24  Lue Elsie BROCKS, MD  ibuprofen  (ADVIL ) 200 MG tablet Take 400 mg by mouth 2 (two) times daily as needed for headache (pain).    [provider]  loratadine  (CLARITIN ) 10 MG tablet Take 1 tablet (10 mg total)  by mouth daily. 09/03/24   Krishnan, Gokul, MD  losartan -hydrochlorothiazide  (HYZAAR) 50-12.5 MG tablet Take 1 tablet by mouth 2 (two) times daily. Patient taking differently: Take 1 tablet by mouth daily. 03/20/24   Elnora Ip, MD  metoprolol  tartrate (LOPRESSOR ) 50 MG tablet Take 1 tablet (50 mg total) by mouth 2 (two) times daily. Patient taking differently: Take 50 mg by mouth daily. 04/22/24   Vann, Jessica U, DO  pantoprazole  (PROTONIX ) 40 MG tablet Take 40 mg by mouth daily as needed (heartburn).    [provider]  predniSONE  (DELTASONE ) 20 MG tablet Take 3 tablets once daily for 3 days followed by 2 tablets once daily for 3 days followed by 1 tablet once daily for 3 days and then stop 09/03/24   Krishnan, Gokul, MD  umeclidinium-vilanterol (ANORO ELLIPTA ) 62.5-25 MCG/ACT AEPB Inhale 1 puff into the  lungs daily. Patient taking differently: Inhale 1 puff into the lungs daily as needed (wheezing, shortness of breath). 07/15/24   Lue Elsie BROCKS, MD    Physical Exam: Vitals:   09/05/24 2122 09/05/24 2311  BP: 90/77 (!) 173/79  Pulse: (!) 118 (!) 102  Resp: (!) 22 (!) 21  Temp: 98.2 F (36.8 C) 98.1 F (36.7 C)  TempSrc: Oral Oral  SpO2: 97% 100%    Constitutional: NAD, calm  Eyes: PERTLA, lids and conjunctivae normal ENMT: Mucous membranes are moist. Posterior pharynx clear of any exudate or lesions.   Neck: supple, no masses  Respiratory: Dyspneic with speech. Labored respirations. Prolonged expiratory phae with wheezing.  Cardiovascular: Rate ~120 and regular. No extremity edema.  Abdomen: No tenderness, soft. Bowel sounds active.  Musculoskeletal: no clubbing / cyanosis. No joint deformity upper and lower extremities.   Skin: no significant rashes, lesions, ulcers. Warm, dry, well-perfused. Neurologic: CN 2-12 grossly intact. Moving all extremities. Alert and oriented.  Psychiatric: Pleasant. Cooperative.    Labs and Imaging on Admission: I have  personally reviewed following labs and imaging studies  CBC: Recent Labs  Lab 08/30/24 0855 08/31/24 0453 09/01/24 0527 09/03/24 0447 09/05/24 2131  WBC 5.1 2.7* 6.1 9.1 8.9  HGB 12.1 11.7* 10.9* 11.4* 11.5*  HCT 39.5 39.9 37.2 37.8 37.1  MCV 88.8 91.9 91.6 88.5 86.5  PLT 304 298 245 285 292   Basic Metabolic Panel: Recent Labs  Lab 08/30/24 0855 08/31/24 0453 09/01/24 0527 09/03/24 0447 09/05/24 2131  NA 141 139 140 137 140  K 3.6 3.9 3.8 4.3 3.4*  CL 102 103 103 101 102  CO2 26 21* 24 28 28   GLUCOSE 116* 159* 241* 290* 140*  BUN 10 18 16 15  7*  CREATININE 1.27* 1.10* 0.76 0.74 0.71  CALCIUM  7.8* 8.8* 8.5* 8.1* 8.4*  MG 1.8  --   --   --   --    GFR: Estimated Creatinine Clearance: 66 mL/min (by C-G formula based on SCr of 0.71 mg/dL). Liver Function Tests: No results for input(s): AST, ALT, ALKPHOS, BILITOT, PROT, ALBUMIN in the last 168 hours. No results for input(s): LIPASE, AMYLASE in the last 168 hours. No results for input(s): AMMONIA in the last 168 hours. Coagulation Profile: No results for input(s): INR, PROTIME in the last 168 hours. Cardiac Enzymes: No results for input(s): CKTOTAL, CKMB, CKMBINDEX, TROPONINI in the last 168 hours. BNP (last 3 results) No results for input(s): PROBNP in the last 8760 hours. HbA1C: No results for input(s): HGBA1C in the last 72 hours. CBG: No results for input(s): GLUCAP in the last 168 hours. Lipid Profile: No results for input(s): CHOL, HDL, LDLCALC, TRIG, CHOLHDL, LDLDIRECT in the last 72 hours. Thyroid  Function Tests: No results for input(s): TSH, T4TOTAL, FREET4, T3FREE, THYROIDAB in the last 72 hours. Anemia Panel: No results for input(s): VITAMINB12, FOLATE, FERRITIN, TIBC, IRON, RETICCTPCT in the last 72 hours. Urine analysis:    Component Value Date/Time   COLORURINE STRAW (A) 04/06/2019 1804   APPEARANCEUR CLEAR 04/06/2019 1804    LABSPEC 1.023 04/06/2019 1804   PHURINE 5.0 04/06/2019 1804   GLUCOSEU >=500 (A) 04/06/2019 1804   HGBUR NEGATIVE 04/06/2019 1804   BILIRUBINUR NEGATIVE 04/06/2019 1804   KETONESUR 5 (A) 04/06/2019 1804   PROTEINUR NEGATIVE 04/06/2019 1804   UROBILINOGEN 1.0 02/15/2012 1208   NITRITE NEGATIVE 04/06/2019 1804   LEUKOCYTESUR NEGATIVE 04/06/2019 1804   Sepsis Labs: @LABRCNTIP (procalcitonin:4,lacticidven:4) ) Recent Results (from the past 240 hours)  Resp panel by RT-PCR (RSV, Flu A&B, Covid) Anterior Nasal Swab     Status: None   Collection Time: 08/30/24 10:46 AM   Specimen: Anterior Nasal Swab  Result Value Ref Range Status   SARS Coronavirus 2 by RT PCR NEGATIVE NEGATIVE Final    Comment: (NOTE) SARS-CoV-2 target nucleic acids are NOT DETECTED.  The SARS-CoV-2 RNA is generally detectable in upper respiratory specimens during the acute phase of infection. The lowest concentration of SARS-CoV-2 viral copies this assay can detect is 138 copies/mL. A negative result does not preclude SARS-Cov-2 infection and should not be used as the sole basis for treatment or other patient management decisions. A negative result may occur with  improper specimen collection/handling, submission of specimen other than nasopharyngeal swab, presence of viral mutation(s) within the areas targeted by this assay, and inadequate number of viral copies(<138 copies/mL). A negative result must be combined with clinical observations, patient history, and epidemiological information. The expected result is Negative.  Fact Sheet for Patients:  bloggercourse.com  Fact Sheet for Healthcare Providers:  seriousbroker.it  This test is no t yet approved or cleared by the [place]  FDA and  has been authorized for detection and/or diagnosis of SARS-CoV-2 by FDA under an Emergency Use Authorization (EUA). This EUA will remain  in effect (meaning this test can  be used) for the duration of the COVID-19 declaration under Section 564(b)(1) of the Act, 21 U.S.C.section 360bbb-3(b)(1), unless the authorization is terminated  or revoked sooner.       Influenza A by PCR NEGATIVE NEGATIVE Final   Influenza B by PCR NEGATIVE NEGATIVE Final    Comment: (NOTE) The Xpert Xpress SARS-CoV-2/FLU/RSV plus assay is intended as an aid in the diagnosis of influenza from Nasopharyngeal swab specimens and should not be used as a sole basis for treatment. Nasal washings and aspirates are unacceptable for Xpert Xpress SARS-CoV-2/FLU/RSV testing.  Fact Sheet for Patients: bloggercourse.com  Fact Sheet for Healthcare Providers: seriousbroker.it  This test is not yet approved or cleared by the [place]  FDA and has been authorized for detection and/or diagnosis of SARS-CoV-2 by FDA under an Emergency Use Authorization (EUA). This EUA will remain in effect (meaning this test can be used) for the duration of the COVID-19 declaration under Section 564(b)(1) of the Act, 21 U.S.C. section 360bbb-3(b)(1), unless the authorization is terminated or revoked.     Resp Syncytial Virus by PCR NEGATIVE NEGATIVE Final    Comment: (NOTE) Fact Sheet for Patients: bloggercourse.com  Fact Sheet for Healthcare Providers: seriousbroker.it  This test is not yet approved or cleared by the [place]  FDA and has been authorized for detection and/or diagnosis of SARS-CoV-2 by FDA under an Emergency Use Authorization (EUA). This EUA will remain in effect (meaning this test can be used) for the duration of the COVID-19 declaration under Section 564(b)(1) of the Act, 21 U.S.C. section 360bbb-3(b)(1), unless the authorization is terminated or revoked.  Performed at [facility], [address], [place], [place] [address]   Resp panel by RT-PCR (RSV, Flu  A&B, Covid) Anterior Nasal Swab     Status: None   Collection Time: 09/05/24 10:25 PM   Specimen: Anterior Nasal Swab  Result Value Ref Range Status   SARS Coronavirus 2 by RT PCR NEGATIVE NEGATIVE Final    Comment: (NOTE) SARS-CoV-2 target nucleic acids are NOT DETECTED.  The SARS-CoV-2 RNA is generally detectable in upper respiratory specimens during the acute phase of infection. The lowest concentration of  SARS-CoV-2 viral copies this assay can detect is 138 copies/mL. A negative result does not preclude SARS-Cov-2 infection and should not be used as the sole basis for treatment or other patient management decisions. A negative result may occur with  improper specimen collection/handling, submission of specimen other than nasopharyngeal swab, presence of viral mutation(s) within the areas targeted by this assay, and inadequate number of viral copies(<138 copies/mL). A negative result must be combined with clinical observations, patient history, and epidemiological information. The expected result is Negative.  Fact Sheet for Patients:  bloggercourse.com  Fact Sheet for Healthcare Providers:  seriousbroker.it  This test is no t yet approved or cleared by the [place]  FDA and  has been authorized for detection and/or diagnosis of SARS-CoV-2 by FDA under an Emergency Use Authorization (EUA). This EUA will remain  in effect (meaning this test can be used) for the duration of the COVID-19 declaration under Section 564(b)(1) of the Act, 21 U.S.C.section 360bbb-3(b)(1), unless the authorization is terminated  or revoked sooner.       Influenza A by PCR NEGATIVE NEGATIVE Final   Influenza B by PCR NEGATIVE NEGATIVE Final    Comment: (NOTE) The Xpert Xpress SARS-CoV-2/FLU/RSV plus assay is intended as an aid in the diagnosis of influenza from Nasopharyngeal swab specimens and should not be used as a sole basis for treatment.  Nasal washings and aspirates are unacceptable for Xpert Xpress SARS-CoV-2/FLU/RSV testing.  Fact Sheet for Patients: bloggercourse.com  Fact Sheet for Healthcare Providers: seriousbroker.it  This test is not yet approved or cleared by the [place]  FDA and has been authorized for detection and/or diagnosis of SARS-CoV-2 by FDA under an Emergency Use Authorization (EUA). This EUA will remain in effect (meaning this test can be used) for the duration of the COVID-19 declaration under Section 564(b)(1) of the Act, 21 U.S.C. section 360bbb-3(b)(1), unless the authorization is terminated or revoked.     Resp Syncytial Virus by PCR NEGATIVE NEGATIVE Final    Comment: (NOTE) Fact Sheet for Patients: bloggercourse.com  Fact Sheet for Healthcare Providers: seriousbroker.it  This test is not yet approved or cleared by the [place]  FDA and has been authorized for detection and/or diagnosis of SARS-CoV-2 by FDA under an Emergency Use Authorization (EUA). This EUA will remain in effect (meaning this test can be used) for the duration of the COVID-19 declaration under Section 564(b)(1) of the Act, 21 U.S.C. section 360bbb-3(b)(1), unless the authorization is terminated or revoked.  Performed at [facility], [address] [place], [place] [address]      Radiological Exams on Admission: DG Chest 2 View Result Date: 09/05/2024 CLINICAL DATA:  Shortness of breath.  Cough. EXAM: CHEST - 2 VIEW COMPARISON:  Radiograph 08/30/2024 FINDINGS: The lungs are hyperinflated with diffuse bronchial thickening. Blunting of the right costophrenic angle likely due to hyperinflation. Stable heart size and mediastinal contours. No focal opacity, pleural effusion or pneumothorax. Right axillary surgical clips. Lower cervical spine hardware. A Bobby pin projecting over the upper  chest is external to the patient on the lateral view. IMPRESSION: Hyperinflation and bronchial thickening, can be seen with COPD exacerbation/acute bronchitis. Electronically Signed   By: Andrea Gasman M.D.   On: 09/05/2024 21:46    EKG: Independently reviewed. Sinus tachycardia, rate 113.   Assessment/Plan  1. COPD exacerbation  - Culture sputum, continue systemic steroids, start antibiotic, continue short-acting bronchodilators    2. Hypertension  - Continue home medications     DVT prophylaxis: Lovenox   Code Status: Full  Level of Care: Level of care: Telemetry Family Communication: None present   Disposition Plan:  Patient is from: Home  Anticipated d/c is to: Home  Anticipated d/c date is: 09/08/24  Patient currently: Pending improved respiratory status  Consults called: none  Admission status: Inpatient    [name] Sprinkles, MD [facility]  09/05/2024, 11:37 PM

## 2024-09-05 NOTE — ED Triage Notes (Signed)
 Pt arrives with reports of SHOB that started today. Pt reports hx of asthma and COPD and has been using inhaler and nebulizer without relief. Pt recently admitted for same and has been taking steroids

## 2024-09-05 NOTE — ED Notes (Signed)
BLUE TOP SENT

## 2024-09-06 DIAGNOSIS — J441 Chronic obstructive pulmonary disease with (acute) exacerbation: Secondary | ICD-10-CM | POA: Diagnosis not present

## 2024-09-06 LAB — CBC
HCT: 38.2 % (ref 36.0–46.0)
Hemoglobin: 11.7 g/dL — ABNORMAL LOW (ref 12.0–15.0)
MCH: 26.7 pg (ref 26.0–34.0)
MCHC: 30.6 g/dL (ref 30.0–36.0)
MCV: 87.2 fL (ref 80.0–100.0)
Platelets: 317 K/uL (ref 150–400)
RBC: 4.38 MIL/uL (ref 3.87–5.11)
RDW: 14.8 % (ref 11.5–15.5)
WBC: 10.3 K/uL (ref 4.0–10.5)
nRBC: 0 % (ref 0.0–0.2)

## 2024-09-06 LAB — BASIC METABOLIC PANEL WITH GFR
Anion gap: 13 (ref 5–15)
BUN: 8 mg/dL (ref 8–23)
CO2: 26 mmol/L (ref 22–32)
Calcium: 8.4 mg/dL — ABNORMAL LOW (ref 8.9–10.3)
Chloride: 100 mmol/L (ref 98–111)
Creatinine, Ser: 0.76 mg/dL (ref 0.44–1.00)
GFR, Estimated: >60 mL/min (ref >60)
Glucose, Bld: 254 mg/dL — ABNORMAL HIGH (ref 70–99)
Potassium: 4.4 mmol/L (ref 3.5–5.1)
Sodium: 138 mmol/L (ref 135–145)

## 2024-09-06 LAB — MAGNESIUM: Magnesium: 1.9 mg/dL (ref 1.7–2.4)

## 2024-09-06 MED ORDER — ALBUTEROL SULFATE (2.5 MG/3ML) 0.083% IN NEBU
2.5000 mg | INHALATION_SOLUTION | RESPIRATORY_TRACT | Status: DC | PRN
  Administered 2024-09-06 – 2024-09-12 (×8): 2.5 mg via RESPIRATORY_TRACT
  Filled 2024-09-06 (×8): qty 3

## 2024-09-06 MED ORDER — LORAZEPAM 0.5 MG PO TABS
0.5000 mg | ORAL_TABLET | Freq: Once | ORAL | Status: AC
  Administered 2024-09-06: 0.5 mg via ORAL
  Filled 2024-09-06: qty 1

## 2024-09-06 MED ORDER — IPRATROPIUM-ALBUTEROL 0.5-2.5 (3) MG/3ML IN SOLN
3.0000 mL | Freq: Four times a day (QID) | RESPIRATORY_TRACT | Status: DC
  Administered 2024-09-06 – 2024-09-08 (×8): 3 mL via RESPIRATORY_TRACT
  Filled 2024-09-06 (×8): qty 3

## 2024-09-06 MED ORDER — HYDROCHLOROTHIAZIDE 12.5 MG PO TABS
12.5000 mg | ORAL_TABLET | Freq: Every day | ORAL | Status: DC
  Administered 2024-09-06 – 2024-09-13 (×8): 12.5 mg via ORAL
  Filled 2024-09-06 (×8): qty 1

## 2024-09-06 MED ORDER — LOSARTAN POTASSIUM 50 MG PO TABS
50.0000 mg | ORAL_TABLET | Freq: Every day | ORAL | Status: DC
  Administered 2024-09-06 – 2024-09-13 (×8): 50 mg via ORAL
  Filled 2024-09-06 (×8): qty 1

## 2024-09-06 MED ORDER — BUDESONIDE 0.5 MG/2ML IN SUSP
0.5000 mg | Freq: Two times a day (BID) | RESPIRATORY_TRACT | Status: DC
  Administered 2024-09-06 – 2024-09-13 (×15): 0.5 mg via RESPIRATORY_TRACT
  Filled 2024-09-06 (×15): qty 2

## 2024-09-06 MED ORDER — ALPRAZOLAM 0.25 MG PO TABS
0.2500 mg | ORAL_TABLET | Freq: Three times a day (TID) | ORAL | Status: DC | PRN
  Administered 2024-09-06 – 2024-09-08 (×5): 0.25 mg via ORAL
  Filled 2024-09-06 (×6): qty 1

## 2024-09-06 NOTE — Plan of Care (Signed)
  Problem: Education: Goal: Knowledge of General Education information will improve Description: Including pain rating scale, medication(s)/side effects and non-pharmacologic comfort measures Outcome: Progressing   Problem: Clinical Measurements: Goal: Will remain free from infection Outcome: Progressing Goal: Diagnostic test results will improve Outcome: Progressing   Problem: Nutrition: Goal: Adequate nutrition will be maintained Outcome: Progressing   Problem: Coping: Goal: Level of anxiety will decrease Outcome: Progressing   Problem: Elimination: Goal: Will not experience complications related to bowel motility Outcome: Progressing Goal: Will not experience complications related to urinary retention Outcome: Progressing   Problem: Pain Managment: Goal: General experience of comfort will improve and/or be controlled Outcome: Progressing   Problem: Skin Integrity: Goal: Risk for impaired skin integrity will decrease Outcome: Progressing

## 2024-09-06 NOTE — Progress Notes (Signed)
 PROGRESS NOTE    [name]  FMW:[id number] DOB: [birth year] DOA: 09/05/2024 PCP: Elnora Ip, MD   Brief Narrative:  73 year old female with history of hypertension, COPD with recent admission for COPD exacerbation from 08/30/2024 to 09/03/2024 presented with worsening shortness of breath.  She was admitted for COPD exacerbation.  Assessment & Plan:   COPD exacerbation Tobacco abuse -Currently on room air.  Influenza/COVID/RSV PCR negative.  Chest x-ray showed hyperinflation and bronchial thickening. - Continue IV Solu-Medrol  along with current nebs and oral Zithromax  - Counseled regarding tobacco cessation  Hypertension - Continue amlodipine , losartan  and hydrochlorothiazide   Hypokalemia - Resolved  DVT prophylaxis: Lovenox  Code Status: Full Family Communication: None at bedside Disposition Plan: Status is: Inpatient Remains inpatient appropriate because: Of severity of illness    Consultants: None  Procedures: None  Antimicrobials: Zithromax  from 09/05/2024 onwards   Subjective: Patient seen and examined at bedside.  Feels slightly better but still short of breath with exertion.  No fever, vomiting reported.  Objective: Vitals:   09/06/24 0515 09/06/24 0530 09/06/24 0615 09/06/24 0645  BP: (!) 149/74 (!) 159/76 (!) 148/90 134/77  Pulse: 95 85 97 100  Resp: 18   20  Temp:      TempSrc:      SpO2: 98% 98% 98% 99%    Intake/Output Summary (Last 24 hours) at 09/06/2024 0850 Last data filed at 09/06/2024 0130 Gross per 24 hour  Intake 236.31 ml  Output --  Net 236.31 ml   There were no vitals filed for this visit.  Examination:  General exam: Appears calm and comfortable  Respiratory system: Bilateral decreased breath sounds at bases with scattered crackles and wheezing Cardiovascular system: S1 & S2 heard, Rate controlled Gastrointestinal system: Abdomen is nondistended, soft and nontender. Normal bowel sounds heard. Extremities:  No cyanosis, clubbing, edema  Central nervous system: Alert and oriented. No focal neurological deficits. Moving extremities Skin: No rashes, lesions or ulcers Psychiatry: Judgement and insight appear normal. Mood & affect appropriate.     Data Reviewed: I have personally reviewed following labs and imaging studies  CBC: Recent Labs  Lab 08/31/24 0453 09/01/24 0527 09/03/24 0447 09/05/24 2131 09/06/24 0439  WBC 2.7* 6.1 9.1 8.9 10.3  HGB 11.7* 10.9* 11.4* 11.5* 11.7*  HCT 39.9 37.2 37.8 37.1 38.2  MCV 91.9 91.6 88.5 86.5 87.2  PLT 298 245 285 292 317   Basic Metabolic Panel: Recent Labs  Lab 08/30/24 0855 08/31/24 0453 09/01/24 0527 09/03/24 0447 09/05/24 2131 09/06/24 0439  NA 141 139 140 137 140 138  K 3.6 3.9 3.8 4.3 3.4* 4.4  CL 102 103 103 101 102 100  CO2 26 21* 24 28 28 26   GLUCOSE 116* 159* 241* 290* 140* 254*  BUN 10 18 16 15  7* 8  CREATININE 1.27* 1.10* 0.76 0.74 0.71 0.76  CALCIUM  7.8* 8.8* 8.5* 8.1* 8.4* 8.4*  MG 1.8  --   --   --   --  1.9   GFR: Estimated Creatinine Clearance: 66 mL/min (by C-G formula based on SCr of 0.76 mg/dL). Liver Function Tests: No results for input(s): AST, ALT, ALKPHOS, BILITOT, PROT, ALBUMIN in the last 168 hours. No results for input(s): LIPASE, AMYLASE in the last 168 hours. No results for input(s): AMMONIA in the last 168 hours. Coagulation Profile: No results for input(s): INR, PROTIME in the last 168 hours. Cardiac Enzymes: No results for input(s): CKTOTAL, CKMB, CKMBINDEX, TROPONINI in the last 168 hours. BNP (last 3 results) No results  for input(s): PROBNP in the last 8760 hours. HbA1C: No results for input(s): HGBA1C in the last 72 hours. CBG: No results for input(s): GLUCAP in the last 168 hours. Lipid Profile: No results for input(s): CHOL, HDL, LDLCALC, TRIG, CHOLHDL, LDLDIRECT in the last 72 hours. Thyroid  Function Tests: No results for input(s): TSH,  T4TOTAL, FREET4, T3FREE, THYROIDAB in the last 72 hours. Anemia Panel: No results for input(s): VITAMINB12, FOLATE, FERRITIN, TIBC, IRON, RETICCTPCT in the last 72 hours. Sepsis Labs: No results for input(s): PROCALCITON, LATICACIDVEN in the last 168 hours.  Recent Results (from the past 240 hours)  Resp panel by RT-PCR (RSV, Flu A&B, Covid) Anterior Nasal Swab     Status: None   Collection Time: 08/30/24 10:46 AM   Specimen: Anterior Nasal Swab  Result Value Ref Range Status   SARS Coronavirus 2 by RT PCR NEGATIVE NEGATIVE Final    Comment: (NOTE) SARS-CoV-2 target nucleic acids are NOT DETECTED.  The SARS-CoV-2 RNA is generally detectable in upper respiratory specimens during the acute phase of infection. The lowest concentration of SARS-CoV-2 viral copies this assay can detect is 138 copies/mL. A negative result does not preclude SARS-Cov-2 infection and should not be used as the sole basis for treatment or other patient management decisions. A negative result may occur with  improper specimen collection/handling, submission of specimen other than nasopharyngeal swab, presence of viral mutation(s) within the areas targeted by this assay, and inadequate number of viral copies(<138 copies/mL). A negative result must be combined with clinical observations, patient history, and epidemiological information. The expected result is Negative.  Fact Sheet for Patients:  bloggercourse.com  Fact Sheet for Healthcare Providers:  seriousbroker.it  This test is no t yet approved or cleared by the [place]  FDA and  has been authorized for detection and/or diagnosis of SARS-CoV-2 by FDA under an Emergency Use Authorization (EUA). This EUA will remain  in effect (meaning this test can be used) for the duration of the COVID-19 declaration under Section 564(b)(1) of the Act, 21 U.S.C.section 360bbb-3(b)(1), unless  the authorization is terminated  or revoked sooner.       Influenza A by PCR NEGATIVE NEGATIVE Final   Influenza B by PCR NEGATIVE NEGATIVE Final    Comment: (NOTE) The Xpert Xpress SARS-CoV-2/FLU/RSV plus assay is intended as an aid in the diagnosis of influenza from Nasopharyngeal swab specimens and should not be used as a sole basis for treatment. Nasal washings and aspirates are unacceptable for Xpert Xpress SARS-CoV-2/FLU/RSV testing.  Fact Sheet for Patients: bloggercourse.com  Fact Sheet for Healthcare Providers: seriousbroker.it  This test is not yet approved or cleared by the [place]  FDA and has been authorized for detection and/or diagnosis of SARS-CoV-2 by FDA under an Emergency Use Authorization (EUA). This EUA will remain in effect (meaning this test can be used) for the duration of the COVID-19 declaration under Section 564(b)(1) of the Act, 21 U.S.C. section 360bbb-3(b)(1), unless the authorization is terminated or revoked.     Resp Syncytial Virus by PCR NEGATIVE NEGATIVE Final    Comment: (NOTE) Fact Sheet for Patients: bloggercourse.com  Fact Sheet for Healthcare Providers: seriousbroker.it  This test is not yet approved or cleared by the [place]  FDA and has been authorized for detection and/or diagnosis of SARS-CoV-2 by FDA under an Emergency Use Authorization (EUA). This EUA will remain in effect (meaning this test can be used) for the duration of the COVID-19 declaration under Section 564(b)(1) of the Act, 21 U.S.C. section 360bbb-3(b)(1),  unless the authorization is terminated or revoked.  Performed at [facility], [address] [place], [place] [address]   Resp panel by RT-PCR (RSV, Flu A&B, Covid) Anterior Nasal Swab     Status: None   Collection Time: 09/05/24 10:25 PM   Specimen: Anterior Nasal Swab  Result  Value Ref Range Status   SARS Coronavirus 2 by RT PCR NEGATIVE NEGATIVE Final    Comment: (NOTE) SARS-CoV-2 target nucleic acids are NOT DETECTED.  The SARS-CoV-2 RNA is generally detectable in upper respiratory specimens during the acute phase of infection. The lowest concentration of SARS-CoV-2 viral copies this assay can detect is 138 copies/mL. A negative result does not preclude SARS-Cov-2 infection and should not be used as the sole basis for treatment or other patient management decisions. A negative result may occur with  improper specimen collection/handling, submission of specimen other than nasopharyngeal swab, presence of viral mutation(s) within the areas targeted by this assay, and inadequate number of viral copies(<138 copies/mL). A negative result must be combined with clinical observations, patient history, and epidemiological information. The expected result is Negative.  Fact Sheet for Patients:  bloggercourse.com  Fact Sheet for Healthcare Providers:  seriousbroker.it  This test is no t yet approved or cleared by the [place]  FDA and  has been authorized for detection and/or diagnosis of SARS-CoV-2 by FDA under an Emergency Use Authorization (EUA). This EUA will remain  in effect (meaning this test can be used) for the duration of the COVID-19 declaration under Section 564(b)(1) of the Act, 21 U.S.C.section 360bbb-3(b)(1), unless the authorization is terminated  or revoked sooner.       Influenza A by PCR NEGATIVE NEGATIVE Final   Influenza B by PCR NEGATIVE NEGATIVE Final    Comment: (NOTE) The Xpert Xpress SARS-CoV-2/FLU/RSV plus assay is intended as an aid in the diagnosis of influenza from Nasopharyngeal swab specimens and should not be used as a sole basis for treatment. Nasal washings and aspirates are unacceptable for Xpert Xpress SARS-CoV-2/FLU/RSV testing.  Fact Sheet for  Patients: bloggercourse.com  Fact Sheet for Healthcare Providers: seriousbroker.it  This test is not yet approved or cleared by the [place]  FDA and has been authorized for detection and/or diagnosis of SARS-CoV-2 by FDA under an Emergency Use Authorization (EUA). This EUA will remain in effect (meaning this test can be used) for the duration of the COVID-19 declaration under Section 564(b)(1) of the Act, 21 U.S.C. section 360bbb-3(b)(1), unless the authorization is terminated or revoked.     Resp Syncytial Virus by PCR NEGATIVE NEGATIVE Final    Comment: (NOTE) Fact Sheet for Patients: bloggercourse.com  Fact Sheet for Healthcare Providers: seriousbroker.it  This test is not yet approved or cleared by the [place]  FDA and has been authorized for detection and/or diagnosis of SARS-CoV-2 by FDA under an Emergency Use Authorization (EUA). This EUA will remain in effect (meaning this test can be used) for the duration of the COVID-19 declaration under Section 564(b)(1) of the Act, 21 U.S.C. section 360bbb-3(b)(1), unless the authorization is terminated or revoked.  Performed at [facility], [address], [place], [place] [address]          Radiology Studies: DG Chest 2 View Result Date: 09/05/2024 CLINICAL DATA:  Shortness of breath.  Cough. EXAM: CHEST - 2 VIEW COMPARISON:  Radiograph 08/30/2024 FINDINGS: The lungs are hyperinflated with diffuse bronchial thickening. Blunting of the right costophrenic angle likely due to hyperinflation. Stable heart size and mediastinal  contours. No focal opacity, pleural effusion or pneumothorax. Right axillary surgical clips. Lower cervical spine hardware. A Bobby pin projecting over the upper chest is external to the patient on the lateral view. IMPRESSION: Hyperinflation and bronchial thickening, can be seen  with COPD exacerbation/acute bronchitis. Electronically Signed   By: Andrea Gasman M.D.   On: 09/05/2024 21:46        Scheduled Meds:  amLODipine   10 mg Oral Daily   azithromycin   500 mg Oral Daily   enoxaparin  (LOVENOX ) injection  40 mg Subcutaneous Q24H   losartan   50 mg Oral Daily   And   hydrochlorothiazide   12.5 mg Oral Daily   methylPREDNISolone  (SOLU-MEDROL ) injection  125 mg Intravenous Q12H   Followed by   NOREEN ON 09/07/2024] predniSONE   40 mg Oral Q breakfast   metoprolol  tartrate  50 mg Oral BID   sodium chloride  flush  3 mL Intravenous Q12H   Continuous Infusions:        Sophie Mao, MD [facility] 09/06/2024, 8:50 AM

## 2024-09-07 DIAGNOSIS — J441 Chronic obstructive pulmonary disease with (acute) exacerbation: Secondary | ICD-10-CM | POA: Diagnosis not present

## 2024-09-07 MED ORDER — METHYLPREDNISOLONE SODIUM SUCC 125 MG IJ SOLR
80.0000 mg | Freq: Two times a day (BID) | INTRAMUSCULAR | Status: AC
  Administered 2024-09-07 – 2024-09-08 (×2): 80 mg via INTRAVENOUS
  Filled 2024-09-07 (×2): qty 2

## 2024-09-07 MED ORDER — ALBUTEROL SULFATE (2.5 MG/3ML) 0.083% IN NEBU
10.0000 mg | INHALATION_SOLUTION | Freq: Once | RESPIRATORY_TRACT | Status: AC
  Administered 2024-09-07: 10 mg via RESPIRATORY_TRACT
  Filled 2024-09-07: qty 12

## 2024-09-07 MED ORDER — ALPRAZOLAM 0.25 MG PO TABS
0.2500 mg | ORAL_TABLET | Freq: Once | ORAL | Status: AC
  Administered 2024-09-07: 0.25 mg via ORAL
  Filled 2024-09-07: qty 1

## 2024-09-07 MED ORDER — GUAIFENESIN 100 MG/5ML PO LIQD
10.0000 mL | ORAL | Status: DC | PRN
  Administered 2024-09-07 – 2024-09-09 (×5): 10 mL via ORAL
  Filled 2024-09-07 (×5): qty 10

## 2024-09-07 MED ORDER — METHYLPREDNISOLONE SODIUM SUCC 125 MG IJ SOLR
80.0000 mg | Freq: Every day | INTRAMUSCULAR | Status: AC
Start: 2024-09-07 — End: 2024-09-07
  Administered 2024-09-07: 80 mg via INTRAVENOUS
  Filled 2024-09-07: qty 2

## 2024-09-07 MED ORDER — CARMEX CLASSIC LIP BALM EX OINT
TOPICAL_OINTMENT | CUTANEOUS | Status: DC | PRN
  Filled 2024-09-07: qty 10

## 2024-09-07 MED ORDER — MAGNESIUM SULFATE 2 GM/50ML IV SOLN
2.0000 g | Freq: Once | INTRAVENOUS | Status: AC
  Administered 2024-09-07: 2 g via INTRAVENOUS
  Filled 2024-09-07: qty 50

## 2024-09-07 NOTE — Progress Notes (Signed)
 PROGRESS NOTE    [name]  FMW:[id number] DOB: 1951-01-12 DOA: 09/05/2024 PCP: Elnora Ip, MD   Brief Narrative:  73 year old female with history of hypertension, COPD with recent admission for COPD exacerbation from 08/30/2024 to 09/03/2024 presented with worsening shortness of breath.  She was admitted for COPD exacerbation.  Assessment & Plan:   COPD exacerbation Tobacco abuse -Currently on room air.  Influenza/COVID/RSV PCR negative.  Chest x-ray showed hyperinflation and bronchial thickening. - Still has significant wheezing and patient is not ready for discharge today.  Continue IV Solu-Medrol  at 80 mg every 12 hours.  Continue with current nebs and oral Zithromax  - Counseled regarding tobacco cessation  Hypertension - Continue amlodipine , losartan  and hydrochlorothiazide   Hypokalemia - Resolved  Hyperglycemia - Possibly from steroid use.  A1c 5.7 on 08/05/2024  DVT prophylaxis: Lovenox  Code Status: Full Family Communication: None at bedside Disposition Plan: Status is: Inpatient Remains inpatient appropriate because: Of severity of illness    Consultants: None  Procedures: None  Antimicrobials: Zithromax  from 09/05/2024 onwards   Subjective: Patient seen and examined at bedside.  Does not feel any better.  Still short of breath with any exertion.  Feels anxious.  No fever, vomiting, chest pain reported. Objective: Vitals:   09/06/24 2038 09/06/24 2322 09/06/24 2349 09/07/24 0716  BP: (!) 143/74  (!) 142/79   Pulse: 77  86   Resp: 18  19   Temp: 97.8 F (36.6 C)  97.8 F (36.6 C)   TempSrc:      SpO2: 100% 98% 98% 97%   No intake or output data in the 24 hours ending 09/07/24 1013  There were no vitals filed for this visit.  Examination:  General: On room air.  No distress ENT/neck: No thyromegaly.  JVD is not elevated  respiratory: Decreased breath sounds at bases bilaterally with some crackles; diffuse wheezing CVS: S1-S2  heard, rate controlled currently Abdominal: Soft, nontender, slightly distended; no organomegaly, bowel sounds are heard Extremities: Trace lower extremity edema; no cyanosis  CNS: Awake and alert.  No focal neurologic deficit.  Moves extremities Lymph: No obvious lymphadenopathy Skin: No obvious ecchymosis/lesions  psych: Affect is mostly flat.  Not agitated.  Looks intermittently anxious musculoskeletal: No obvious joint swelling/deformity      Data Reviewed: I have personally reviewed following labs and imaging studies  CBC: Recent Labs  Lab 09/01/24 0527 09/03/24 0447 09/05/24 2131 09/06/24 0439  WBC 6.1 9.1 8.9 10.3  HGB 10.9* 11.4* 11.5* 11.7*  HCT 37.2 37.8 37.1 38.2  MCV 91.6 88.5 86.5 87.2  PLT 245 285 292 317   Basic Metabolic Panel: Recent Labs  Lab 09/01/24 0527 09/03/24 0447 09/05/24 2131 09/06/24 0439  NA 140 137 140 138  K 3.8 4.3 3.4* 4.4  CL 103 101 102 100  CO2 24 28 28 26   GLUCOSE 241* 290* 140* 254*  BUN 16 15 7* 8  CREATININE 0.76 0.74 0.71 0.76  CALCIUM  8.5* 8.1* 8.4* 8.4*  MG  --   --   --  1.9   GFR: Estimated Creatinine Clearance: 66 mL/min (by C-G formula based on SCr of 0.76 mg/dL). Liver Function Tests: No results for input(s): AST, ALT, ALKPHOS, BILITOT, PROT, ALBUMIN in the last 168 hours. No results for input(s): LIPASE, AMYLASE in the last 168 hours. No results for input(s): AMMONIA in the last 168 hours. Coagulation Profile: No results for input(s): INR, PROTIME in the last 168 hours. Cardiac Enzymes: No results for input(s): CKTOTAL, CKMB, CKMBINDEX,  TROPONINI in the last 168 hours. BNP (last 3 results) No results for input(s): PROBNP in the last 8760 hours. HbA1C: No results for input(s): HGBA1C in the last 72 hours. CBG: No results for input(s): GLUCAP in the last 168 hours. Lipid Profile: No results for input(s): CHOL, HDL, LDLCALC, TRIG, CHOLHDL, LDLDIRECT in the last 72  hours. Thyroid  Function Tests: No results for input(s): TSH, T4TOTAL, FREET4, T3FREE, THYROIDAB in the last 72 hours. Anemia Panel: No results for input(s): VITAMINB12, FOLATE, FERRITIN, TIBC, IRON, RETICCTPCT in the last 72 hours. Sepsis Labs: No results for input(s): PROCALCITON, LATICACIDVEN in the last 168 hours.  Recent Results (from the past 240 hours)  Resp panel by RT-PCR (RSV, Flu A&B, Covid) Anterior Nasal Swab     Status: None   Collection Time: 08/30/24 10:46 AM   Specimen: Anterior Nasal Swab  Result Value Ref Range Status   SARS Coronavirus 2 by RT PCR NEGATIVE NEGATIVE Final    Comment: (NOTE) SARS-CoV-2 target nucleic acids are NOT DETECTED.  The SARS-CoV-2 RNA is generally detectable in upper respiratory specimens during the acute phase of infection. The lowest concentration of SARS-CoV-2 viral copies this assay can detect is 138 copies/mL. A negative result does not preclude SARS-Cov-2 infection and should not be used as the sole basis for treatment or other patient management decisions. A negative result may occur with  improper specimen collection/handling, submission of specimen other than nasopharyngeal swab, presence of viral mutation(s) within the areas targeted by this assay, and inadequate number of viral copies(<138 copies/mL). A negative result must be combined with clinical observations, patient history, and epidemiological information. The expected result is Negative.  Fact Sheet for Patients:  bloggercourse.com  Fact Sheet for Healthcare Providers:  seriousbroker.it  This test is no t yet approved or cleared by the [place]  FDA and  has been authorized for detection and/or diagnosis of SARS-CoV-2 by FDA under an Emergency Use Authorization (EUA). This EUA will remain  in effect (meaning this test can be used) for the duration of the COVID-19 declaration under Section  564(b)(1) of the Act, 21 U.S.C.section 360bbb-3(b)(1), unless the authorization is terminated  or revoked sooner.       Influenza A by PCR NEGATIVE NEGATIVE Final   Influenza B by PCR NEGATIVE NEGATIVE Final    Comment: (NOTE) The Xpert Xpress SARS-CoV-2/FLU/RSV plus assay is intended as an aid in the diagnosis of influenza from Nasopharyngeal swab specimens and should not be used as a sole basis for treatment. Nasal washings and aspirates are unacceptable for Xpert Xpress SARS-CoV-2/FLU/RSV testing.  Fact Sheet for Patients: bloggercourse.com  Fact Sheet for Healthcare Providers: seriousbroker.it  This test is not yet approved or cleared by the [place]  FDA and has been authorized for detection and/or diagnosis of SARS-CoV-2 by FDA under an Emergency Use Authorization (EUA). This EUA will remain in effect (meaning this test can be used) for the duration of the COVID-19 declaration under Section 564(b)(1) of the Act, 21 U.S.C. section 360bbb-3(b)(1), unless the authorization is terminated or revoked.     Resp Syncytial Virus by PCR NEGATIVE NEGATIVE Final    Comment: (NOTE) Fact Sheet for Patients: bloggercourse.com  Fact Sheet for Healthcare Providers: seriousbroker.it  This test is not yet approved or cleared by the [place]  FDA and has been authorized for detection and/or diagnosis of SARS-CoV-2 by FDA under an Emergency Use Authorization (EUA). This EUA will remain in effect (meaning this test can be used) for the duration of the  COVID-19 declaration under Section 564(b)(1) of the Act, 21 U.S.C. section 360bbb-3(b)(1), unless the authorization is terminated or revoked.  Performed at [facility], [address] [place], [place] [address]   Resp panel by RT-PCR (RSV, Flu A&B, Covid) Anterior Nasal Swab     Status: None   Collection Time:  09/05/24 10:25 PM   Specimen: Anterior Nasal Swab  Result Value Ref Range Status   SARS Coronavirus 2 by RT PCR NEGATIVE NEGATIVE Final    Comment: (NOTE) SARS-CoV-2 target nucleic acids are NOT DETECTED.  The SARS-CoV-2 RNA is generally detectable in upper respiratory specimens during the acute phase of infection. The lowest concentration of SARS-CoV-2 viral copies this assay can detect is 138 copies/mL. A negative result does not preclude SARS-Cov-2 infection and should not be used as the sole basis for treatment or other patient management decisions. A negative result may occur with  improper specimen collection/handling, submission of specimen other than nasopharyngeal swab, presence of viral mutation(s) within the areas targeted by this assay, and inadequate number of viral copies(<138 copies/mL). A negative result must be combined with clinical observations, patient history, and epidemiological information. The expected result is Negative.  Fact Sheet for Patients:  bloggercourse.com  Fact Sheet for Healthcare Providers:  seriousbroker.it  This test is no t yet approved or cleared by the [place]  FDA and  has been authorized for detection and/or diagnosis of SARS-CoV-2 by FDA under an Emergency Use Authorization (EUA). This EUA will remain  in effect (meaning this test can be used) for the duration of the COVID-19 declaration under Section 564(b)(1) of the Act, 21 U.S.C.section 360bbb-3(b)(1), unless the authorization is terminated  or revoked sooner.       Influenza A by PCR NEGATIVE NEGATIVE Final   Influenza B by PCR NEGATIVE NEGATIVE Final    Comment: (NOTE) The Xpert Xpress SARS-CoV-2/FLU/RSV plus assay is intended as an aid in the diagnosis of influenza from Nasopharyngeal swab specimens and should not be used as a sole basis for treatment. Nasal washings and aspirates are unacceptable for Xpert Xpress  SARS-CoV-2/FLU/RSV testing.  Fact Sheet for Patients: bloggercourse.com  Fact Sheet for Healthcare Providers: seriousbroker.it  This test is not yet approved or cleared by the [place]  FDA and has been authorized for detection and/or diagnosis of SARS-CoV-2 by FDA under an Emergency Use Authorization (EUA). This EUA will remain in effect (meaning this test can be used) for the duration of the COVID-19 declaration under Section 564(b)(1) of the Act, 21 U.S.C. section 360bbb-3(b)(1), unless the authorization is terminated or revoked.     Resp Syncytial Virus by PCR NEGATIVE NEGATIVE Final    Comment: (NOTE) Fact Sheet for Patients: bloggercourse.com  Fact Sheet for Healthcare Providers: seriousbroker.it  This test is not yet approved or cleared by the [place]  FDA and has been authorized for detection and/or diagnosis of SARS-CoV-2 by FDA under an Emergency Use Authorization (EUA). This EUA will remain in effect (meaning this test can be used) for the duration of the COVID-19 declaration under Section 564(b)(1) of the Act, 21 U.S.C. section 360bbb-3(b)(1), unless the authorization is terminated or revoked.  Performed at [facility], [address] [place], [place] [address]          Radiology Studies: DG Chest 2 View Result Date: 09/05/2024 CLINICAL DATA:  Shortness of breath.  Cough. EXAM: CHEST - 2 VIEW COMPARISON:  Radiograph 08/30/2024 FINDINGS: The lungs are hyperinflated with diffuse bronchial thickening. Blunting of the  right costophrenic angle likely due to hyperinflation. Stable heart size and mediastinal contours. No focal opacity, pleural effusion or pneumothorax. Right axillary surgical clips. Lower cervical spine hardware. A Bobby pin projecting over the upper chest is external to the patient on the lateral view. IMPRESSION:  Hyperinflation and bronchial thickening, can be seen with COPD exacerbation/acute bronchitis. Electronically Signed   By: Andrea Gasman M.D.   On: 09/05/2024 21:46        Scheduled Meds:  amLODipine   10 mg Oral Daily   budesonide  (PULMICORT ) nebulizer solution  0.5 mg Nebulization BID   enoxaparin  (LOVENOX ) injection  40 mg Subcutaneous Q24H   losartan   50 mg Oral Daily   And   hydrochlorothiazide   12.5 mg Oral Daily   ipratropium-albuterol   3 mL Nebulization Q6H   metoprolol  tartrate  50 mg Oral BID   sodium chloride  flush  3 mL Intravenous Q12H   Continuous Infusions:        Sophie Mao, MD [facility] 09/07/2024, 10:13 AM

## 2024-09-07 NOTE — Plan of Care (Signed)

## 2024-09-07 NOTE — TOC Initial Note (Signed)
 Transition of Care [facility]) - Initial/Assessment Note    Patient Details  Name: [name] MRN: [id number] Date of Birth: 1951-05-07  [facility] CM/SW Contact:    Sonda Manuella Quill, RN Phone Number: 09/07/2024, 3:05 PM  Clinical Narrative:                 [name]; pt said she lives at home w/ her spouse [name]; she plans to return at d/c w/ his support; he will provide transportation; pt verified insurance/PCP; she denied SDOH risks; pt has nebulizer; she does not have HH services or home oxygen ; IP CM following.  Expected Discharge Plan: Home/Self Care Barriers to Discharge: Continued Medical Work up   Patient Goals and CMS Choice Patient states their goals for this hospitalization and ongoing recovery are:: home CMS Medicare.gov Compare Post Acute Care list provided to:: Patient        Expected Discharge Plan and Services   Discharge Planning Services: CM Consult   Living arrangements for the past 2 months: Single Family Home                 DME Arranged: N/A DME Agency: NA       HH Arranged: NA HH Agency: NA        Prior Living Arrangements/Services Living arrangements for the past 2 months: Single Family Home Lives with:: Spouse Patient language and need for interpreter reviewed:: Yes Do you feel safe going back to the place where you live?: Yes      Need for Family Participation in Patient Care: Yes (Comment) Care giver support system in place?: Yes (comment) Current home services: DME (nebulizer) Criminal Activity/Legal Involvement Pertinent to Current Situation/Hospitalization: No - Comment as needed  Activities of Daily Living   ADL Screening (condition at time of admission) Independently performs ADLs?: Yes (appropriate for developmental age) Is the patient deaf or have difficulty hearing?: No Does the patient have difficulty seeing, even when wearing glasses/contacts?: No Does the patient have difficulty  concentrating, remembering, or making decisions?: No  Permission Sought/Granted Permission sought to share information with : Case Manager Permission granted to share information with : Yes, Verbal Permission Granted  Share Information with NAME: Case Manager     Permission granted to share info w Relationship: [name] (spouse) [phone]     Emotional Assessment Appearance:: Appears stated age Attitude/Demeanor/Rapport: Gracious Affect (typically observed): Accepting Orientation: : Oriented to Self, Oriented to Place, Oriented to  Time, Oriented to Situation Alcohol  / Substance Use: Not Applicable Psych Involvement: No (comment)  Admission diagnosis:  COPD exacerbation (HCC) [J44.1] COPD with acute exacerbation (HCC) [J44.1] Patient Active Problem List   Diagnosis Date Noted   COPD exacerbation (HCC) 09/06/2024   Class 1 obesity due to excess calories with body mass index (BMI) of 30.0 to 30.9 in adult 08/05/2024   Essential hypertension 05/25/2024   COPD with acute exacerbation (HCC) 05/14/2024   Hypertensive urgency 04/19/2024   Memory changes 03/28/2023   Age-related osteoporosis without current pathological fracture  03/06/2021   Healthcare maintenance 03/06/2021   Chronic hip pain, right 06/22/2019   Diabetic neuropathy (HCC) 05/21/2019   Type 2 diabetes mellitus with complication, without long-term current use of insulin  (HCC) 12/26/2018   Chronic sinusitis    COPD (chronic obstructive pulmonary disease) (HCC) 12/24/2014   Dyslipidemia associated with type 2 diabetes mellitus (HCC) 06/14/2014   Breast nodule 12/01/2012   Primary osteoarthritis of right knee 09/29/2012   Anxiety  12/30/2009   Tobacco use disorder 04/06/2009   Hypertension associated with chronic kidney disease due to type 2 diabetes mellitus (HCC) 04/06/2009   PCP:  Elnora Ip, MD Pharmacy:   [facility] [phone] - [place], [place] - [address] AT [address] [place] [address] Phone: [phone] Fax: [phone]  [facility] [address]  - [address] [place] [address] Phone: [phone] Fax: [phone]   - [facility] [address] [place] [address] Phone: [phone] Fax: [phone]     Social Drivers of Health (SDOH) Social History: SDOH Screenings   Food Insecurity: No Food Insecurity (09/07/2024)  Recent Concern: Food Insecurity - Food Insecurity Present (08/04/2024)  Housing: Low Risk  (09/07/2024)  Recent Concern: Housing - High Risk (08/04/2024)  Transportation Needs: No Transportation Needs (09/07/2024)  Utilities: Not At Risk (09/07/2024)  Alcohol  Screen: Low Risk  (12/13/2022)  Depression (PHQ2-9): Medium Risk (07/16/2024)  Financial Resource Strain: Low Risk  (07/19/2024)  Physical Activity: Inactive (05/28/2024)  Social Connections: Moderately Integrated (09/06/2024)  Stress: Stress Concern Present (06/12/2024)  Tobacco Use: High Risk (09/05/2024)   SDOH Interventions: Food Insecurity Interventions: Intervention Not Indicated, Inpatient TOC Housing Interventions: Intervention Not Indicated, Inpatient TOC Transportation Interventions: Intervention Not Indicated, Inpatient TOC Utilities Interventions: Intervention Not Indicated, Inpatient TOC   Readmission Risk Interventions    09/07/2024    2:54 PM 08/04/2024    6:17 PM 06/21/2024    1:08 PM  Readmission Risk Prevention Plan  Transportation Screening Complete Complete Complete  PCP or Specialist Appt within 3-5 Days  Complete   HRI or Home Care Consult  Complete Complete  Social Work Consult for Recovery Care Planning/Counseling  Complete Complete  Palliative Care Screening  Not Applicable Not Applicable  Medication Review Oceanographer) Complete Complete Complete  PCP or Specialist appointment within 3-5 days of discharge Complete    HRI or Home Care  Consult Complete    SW Recovery Care/Counseling Consult Complete    Palliative Care Screening Not Applicable    Skilled Nursing Facility Not Applicable

## 2024-09-07 NOTE — Progress Notes (Signed)
 Patient called frequently and requested RN and NT to bring her soda and cracker with the amount she wanted every 15 minutes. Patient just finished her soda, crackers with peanut butter, 2 pitchers of ice water before this writer came to her room when she call for assistance. When charge nurse came to patient's room. Patient showed a whole list of soda she wanted and a lot amount of crackers and peanut butter and requested to be brought in her room immediately. Charge nurse  explained that we could not give her a lot of soda at one time as she wanted and offered to bring her just one soda, a cup of ice a time, patient was upset and did not want to listen.

## 2024-09-08 DIAGNOSIS — J441 Chronic obstructive pulmonary disease with (acute) exacerbation: Secondary | ICD-10-CM | POA: Diagnosis not present

## 2024-09-08 MED ORDER — ALPRAZOLAM 0.5 MG PO TABS
0.5000 mg | ORAL_TABLET | Freq: Three times a day (TID) | ORAL | Status: DC | PRN
Start: 2024-09-08 — End: 2024-09-13
  Administered 2024-09-08 – 2024-09-13 (×5): 0.5 mg via ORAL
  Filled 2024-09-08 (×6): qty 1

## 2024-09-08 MED ORDER — IPRATROPIUM-ALBUTEROL 0.5-2.5 (3) MG/3ML IN SOLN
3.0000 mL | RESPIRATORY_TRACT | Status: DC
  Administered 2024-09-08 – 2024-09-13 (×30): 3 mL via RESPIRATORY_TRACT
  Filled 2024-09-08 (×30): qty 3

## 2024-09-08 MED ORDER — METHYLPREDNISOLONE SODIUM SUCC 40 MG IJ SOLR
40.0000 mg | Freq: Two times a day (BID) | INTRAMUSCULAR | Status: DC
  Administered 2024-09-08 – 2024-09-09 (×2): 40 mg via INTRAVENOUS
  Filled 2024-09-08 (×2): qty 1

## 2024-09-08 MED ORDER — BENZONATATE 100 MG PO CAPS
100.0000 mg | ORAL_CAPSULE | Freq: Two times a day (BID) | ORAL | Status: DC | PRN
  Administered 2024-09-08: 100 mg via ORAL
  Filled 2024-09-08: qty 1

## 2024-09-08 NOTE — Plan of Care (Signed)
 No acute events this shift. Problem: Education: Goal: Knowledge of General Education information will improve Description: Including pain rating scale, medication(s)/side effects and non-pharmacologic comfort measures Outcome: Progressing   Problem: Health Behavior/Discharge Planning: Goal: Ability to manage health-related needs will improve Outcome: Progressing   Problem: Clinical Measurements: Goal: Ability to maintain clinical measurements within normal limits will improve Outcome: Progressing Goal: Will remain free from infection Outcome: Progressing Goal: Diagnostic test results will improve Outcome: Progressing Goal: Respiratory complications will improve Outcome: Progressing Goal: Cardiovascular complication will be avoided Outcome: Progressing   Problem: Activity: Goal: Risk for activity intolerance will decrease Outcome: Progressing   Problem: Nutrition: Goal: Adequate nutrition will be maintained Outcome: Progressing   Problem: Coping: Goal: Level of anxiety will decrease Outcome: Progressing   Problem: Elimination: Goal: Will not experience complications related to bowel motility Outcome: Progressing Goal: Will not experience complications related to urinary retention Outcome: Progressing   Problem: Pain Managment: Goal: General experience of comfort will improve and/or be controlled Outcome: Progressing   Problem: Safety: Goal: Ability to remain free from injury will improve Outcome: Progressing   Problem: Skin Integrity: Goal: Risk for impaired skin integrity will decrease Outcome: Progressing   Problem: Education: Goal: Knowledge of disease or condition will improve Outcome: Progressing Goal: Knowledge of the prescribed therapeutic regimen will improve Outcome: Progressing Goal: Individualized Educational Video(s) Outcome: Progressing   Problem: Activity: Goal: Ability to tolerate increased activity will improve Outcome: Progressing Goal:  Will verbalize the importance of balancing activity with adequate rest periods Outcome: Progressing   Problem: Respiratory: Goal: Ability to maintain a clear airway will improve Outcome: Progressing Goal: Levels of oxygenation will improve Outcome: Progressing Goal: Ability to maintain adequate ventilation will improve Outcome: Progressing

## 2024-09-08 NOTE — Progress Notes (Signed)
 PROGRESS NOTE    [name]  FMW:[id number] DOB: February 15, 1951 DOA: 09/05/2024 PCP: Elnora Ip, MD   Brief Narrative:  73 year old female with history of hypertension, COPD with recent admission for COPD exacerbation from 08/30/2024 to 09/03/2024 presented with worsening shortness of breath.  She was admitted for COPD exacerbation.  Assessment & Plan:   COPD exacerbation Tobacco abuse -Currently on room air.  Influenza/COVID/RSV PCR negative.  Chest x-ray showed hyperinflation and bronchial thickening. - Still has significant wheezing and patient is not ready for discharge today.  Continue IV Solu-Medrol  at 40 mg every 12 hours.  Continue with current nebs and oral Zithromax  - Counseled regarding tobacco cessation  Hypertension - Continue amlodipine , losartan  and hydrochlorothiazide   Hypokalemia - Resolved  Hyperglycemia - Possibly from steroid use.  A1c 5.7 on 08/05/2024  DVT prophylaxis: Lovenox  Code Status: Full Family Communication: None at bedside Disposition Plan: Status is: Inpatient Remains inpatient appropriate because: Of severity of illness    Consultants: None  Procedures: None  Antimicrobials: Zithromax  from 09/05/2024 onwards   Subjective: Patient seen and examined at bedside.  Had a rough night last night.  Feels slightly better but does not feel ready to go home today.  Still short of breath with minimal exertion with some cough.  No fever reported.   Objective: Vitals:   09/08/24 0128 09/08/24 0509 09/08/24 0729 09/08/24 0858  BP:  (!) 157/61  (!) 174/65  Pulse:  77  97  Resp:  20  20  Temp:  97.8 F (36.6 C)    TempSrc:  Oral    SpO2: 99% 98% 93% 98%    Intake/Output Summary (Last 24 hours) at 09/08/2024 0956 Last data filed at 09/08/2024 9176 Gross per 24 hour  Intake 377.86 ml  Output --  Net 377.86 ml    There were no vitals filed for this visit.  Examination:  General: Remains on room air and in no distress.    ENT/neck: No neck masses or JVD elevation noted respiratory: Bilateral decreased breath sounds at bases with diffuse wheezing  CVS: S1-S2 heard, rate controlled currently Abdominal: Soft, nontender, distended mildly; no organomegaly, bowel sounds are heard normally Extremities: No clubbing; mild lower extremity edema present   Data Reviewed: I have personally reviewed following labs and imaging studies  CBC: Recent Labs  Lab 09/03/24 0447 09/05/24 2131 09/06/24 0439  WBC 9.1 8.9 10.3  HGB 11.4* 11.5* 11.7*  HCT 37.8 37.1 38.2  MCV 88.5 86.5 87.2  PLT 285 292 317   Basic Metabolic Panel: Recent Labs  Lab 09/03/24 0447 09/05/24 2131 09/06/24 0439  NA 137 140 138  K 4.3 3.4* 4.4  CL 101 102 100  CO2 28 28 26   GLUCOSE 290* 140* 254*  BUN 15 7* 8  CREATININE 0.74 0.71 0.76  CALCIUM  8.1* 8.4* 8.4*  MG  --   --  1.9   GFR: Estimated Creatinine Clearance: 66 mL/min (by C-G formula based on SCr of 0.76 mg/dL). Liver Function Tests: No results for input(s): AST, ALT, ALKPHOS, BILITOT, PROT, ALBUMIN in the last 168 hours. No results for input(s): LIPASE, AMYLASE in the last 168 hours. No results for input(s): AMMONIA in the last 168 hours. Coagulation Profile: No results for input(s): INR, PROTIME in the last 168 hours. Cardiac Enzymes: No results for input(s): CKTOTAL, CKMB, CKMBINDEX, TROPONINI in the last 168 hours. BNP (last 3 results) No results for input(s): PROBNP in the last 8760 hours. HbA1C: No results for input(s): HGBA1C in the  last 72 hours. CBG: No results for input(s): GLUCAP in the last 168 hours. Lipid Profile: No results for input(s): CHOL, HDL, LDLCALC, TRIG, CHOLHDL, LDLDIRECT in the last 72 hours. Thyroid  Function Tests: No results for input(s): TSH, T4TOTAL, FREET4, T3FREE, THYROIDAB in the last 72 hours. Anemia Panel: No results for input(s): VITAMINB12, FOLATE, FERRITIN, TIBC,  IRON, RETICCTPCT in the last 72 hours. Sepsis Labs: No results for input(s): PROCALCITON, LATICACIDVEN in the last 168 hours.  Recent Results (from the past 240 hours)  Resp panel by RT-PCR (RSV, Flu A&B, Covid) Anterior Nasal Swab     Status: None   Collection Time: 08/30/24 10:46 AM   Specimen: Anterior Nasal Swab  Result Value Ref Range Status   SARS Coronavirus 2 by RT PCR NEGATIVE NEGATIVE Final    Comment: (NOTE) SARS-CoV-2 target nucleic acids are NOT DETECTED.  The SARS-CoV-2 RNA is generally detectable in upper respiratory specimens during the acute phase of infection. The lowest concentration of SARS-CoV-2 viral copies this assay can detect is 138 copies/mL. A negative result does not preclude SARS-Cov-2 infection and should not be used as the sole basis for treatment or other patient management decisions. A negative result may occur with  improper specimen collection/handling, submission of specimen other than nasopharyngeal swab, presence of viral mutation(s) within the areas targeted by this assay, and inadequate number of viral copies(<138 copies/mL). A negative result must be combined with clinical observations, patient history, and epidemiological information. The expected result is Negative.  Fact Sheet for Patients:  bloggercourse.com  Fact Sheet for Healthcare Providers:  seriousbroker.it  This test is no t yet approved or cleared by the [place]  FDA and  has been authorized for detection and/or diagnosis of SARS-CoV-2 by FDA under an Emergency Use Authorization (EUA). This EUA will remain  in effect (meaning this test can be used) for the duration of the COVID-19 declaration under Section 564(b)(1) of the Act, 21 U.S.C.section 360bbb-3(b)(1), unless the authorization is terminated  or revoked sooner.       Influenza A by PCR NEGATIVE NEGATIVE Final   Influenza B by PCR NEGATIVE NEGATIVE Final     Comment: (NOTE) The Xpert Xpress SARS-CoV-2/FLU/RSV plus assay is intended as an aid in the diagnosis of influenza from Nasopharyngeal swab specimens and should not be used as a sole basis for treatment. Nasal washings and aspirates are unacceptable for Xpert Xpress SARS-CoV-2/FLU/RSV testing.  Fact Sheet for Patients: bloggercourse.com  Fact Sheet for Healthcare Providers: seriousbroker.it  This test is not yet approved or cleared by the [place]  FDA and has been authorized for detection and/or diagnosis of SARS-CoV-2 by FDA under an Emergency Use Authorization (EUA). This EUA will remain in effect (meaning this test can be used) for the duration of the COVID-19 declaration under Section 564(b)(1) of the Act, 21 U.S.C. section 360bbb-3(b)(1), unless the authorization is terminated or revoked.     Resp Syncytial Virus by PCR NEGATIVE NEGATIVE Final    Comment: (NOTE) Fact Sheet for Patients: bloggercourse.com  Fact Sheet for Healthcare Providers: seriousbroker.it  This test is not yet approved or cleared by the [place]  FDA and has been authorized for detection and/or diagnosis of SARS-CoV-2 by FDA under an Emergency Use Authorization (EUA). This EUA will remain in effect (meaning this test can be used) for the duration of the COVID-19 declaration under Section 564(b)(1) of the Act, 21 U.S.C. section 360bbb-3(b)(1), unless the authorization is terminated or revoked.  Performed at [facility], [address], [place], [place] [address]   Resp panel by RT-PCR (RSV, Flu A&B, Covid) Anterior Nasal Swab     Status: None   Collection Time: 09/05/24 10:25 PM   Specimen: Anterior Nasal Swab  Result Value Ref Range Status   SARS Coronavirus 2 by RT PCR NEGATIVE NEGATIVE Final    Comment: (NOTE) SARS-CoV-2 target nucleic acids are NOT  DETECTED.  The SARS-CoV-2 RNA is generally detectable in upper respiratory specimens during the acute phase of infection. The lowest concentration of SARS-CoV-2 viral copies this assay can detect is 138 copies/mL. A negative result does not preclude SARS-Cov-2 infection and should not be used as the sole basis for treatment or other patient management decisions. A negative result may occur with  improper specimen collection/handling, submission of specimen other than nasopharyngeal swab, presence of viral mutation(s) within the areas targeted by this assay, and inadequate number of viral copies(<138 copies/mL). A negative result must be combined with clinical observations, patient history, and epidemiological information. The expected result is Negative.  Fact Sheet for Patients:  bloggercourse.com  Fact Sheet for Healthcare Providers:  seriousbroker.it  This test is no t yet approved or cleared by the [place]  FDA and  has been authorized for detection and/or diagnosis of SARS-CoV-2 by FDA under an Emergency Use Authorization (EUA). This EUA will remain  in effect (meaning this test can be used) for the duration of the COVID-19 declaration under Section 564(b)(1) of the Act, 21 U.S.C.section 360bbb-3(b)(1), unless the authorization is terminated  or revoked sooner.       Influenza A by PCR NEGATIVE NEGATIVE Final   Influenza B by PCR NEGATIVE NEGATIVE Final    Comment: (NOTE) The Xpert Xpress SARS-CoV-2/FLU/RSV plus assay is intended as an aid in the diagnosis of influenza from Nasopharyngeal swab specimens and should not be used as a sole basis for treatment. Nasal washings and aspirates are unacceptable for Xpert Xpress SARS-CoV-2/FLU/RSV testing.  Fact Sheet for Patients: bloggercourse.com  Fact Sheet for Healthcare Providers: seriousbroker.it  This test is not yet  approved or cleared by the [place]  FDA and has been authorized for detection and/or diagnosis of SARS-CoV-2 by FDA under an Emergency Use Authorization (EUA). This EUA will remain in effect (meaning this test can be used) for the duration of the COVID-19 declaration under Section 564(b)(1) of the Act, 21 U.S.C. section 360bbb-3(b)(1), unless the authorization is terminated or revoked.     Resp Syncytial Virus by PCR NEGATIVE NEGATIVE Final    Comment: (NOTE) Fact Sheet for Patients: bloggercourse.com  Fact Sheet for Healthcare Providers: seriousbroker.it  This test is not yet approved or cleared by the [place]  FDA and has been authorized for detection and/or diagnosis of SARS-CoV-2 by FDA under an Emergency Use Authorization (EUA). This EUA will remain in effect (meaning this test can be used) for the duration of the COVID-19 declaration under Section 564(b)(1) of the Act, 21 U.S.C. section 360bbb-3(b)(1), unless the authorization is terminated or revoked.  Performed at [facility], [address] [place], [place] [address]          Radiology Studies: No results found.       Scheduled Meds:  amLODipine   10 mg Oral Daily   budesonide  (PULMICORT ) nebulizer solution  0.5 mg Nebulization BID   enoxaparin  (LOVENOX ) injection  40 mg Subcutaneous Q24H   losartan   50 mg Oral Daily   And   hydrochlorothiazide   12.5 mg Oral Daily   ipratropium-albuterol   3 mL  Nebulization Q6H   metoprolol  tartrate  50 mg Oral BID   sodium chloride  flush  3 mL Intravenous Q12H   Continuous Infusions:        Sophie Mao, MD [facility] 09/08/2024, 9:56 AM

## 2024-09-09 DIAGNOSIS — J441 Chronic obstructive pulmonary disease with (acute) exacerbation: Secondary | ICD-10-CM | POA: Diagnosis not present

## 2024-09-09 MED ORDER — BENZONATATE 100 MG PO CAPS
200.0000 mg | ORAL_CAPSULE | Freq: Two times a day (BID) | ORAL | Status: DC | PRN
  Administered 2024-09-11 – 2024-09-12 (×2): 200 mg via ORAL
  Filled 2024-09-09 (×2): qty 2

## 2024-09-09 MED ORDER — METHYLPREDNISOLONE SODIUM SUCC 40 MG IJ SOLR
40.0000 mg | Freq: Once | INTRAMUSCULAR | Status: AC
  Administered 2024-09-09: 40 mg via INTRAVENOUS
  Filled 2024-09-09: qty 1

## 2024-09-09 MED ORDER — GUAIFENESIN 100 MG/5ML PO LIQD
15.0000 mL | ORAL | Status: DC | PRN
  Administered 2024-09-11: 15 mL via ORAL
  Filled 2024-09-09: qty 20

## 2024-09-09 MED ORDER — METHYLPREDNISOLONE SODIUM SUCC 125 MG IJ SOLR
80.0000 mg | Freq: Two times a day (BID) | INTRAMUSCULAR | Status: DC
  Administered 2024-09-09 – 2024-09-10 (×3): 80 mg via INTRAVENOUS
  Filled 2024-09-09 (×4): qty 2

## 2024-09-09 NOTE — Progress Notes (Signed)
 PROGRESS NOTE    MARCILLE BARMAN  FMW:[id number] DOB: 08-Jun-1951 DOA: 09/05/2024 PCP: Elnora Ip, MD   Brief Narrative:  73 year old female with history of hypertension, COPD with recent admission for COPD exacerbation from 08/30/2024 to 09/03/2024 presented with worsening shortness of breath.  She was admitted for COPD exacerbation.  Assessment & Plan:   COPD exacerbation Tobacco abuse -Currently on room air.  Influenza/COVID/RSV PCR negative.  Chest x-ray showed hyperinflation and bronchial thickening. - Continues to be significantly still not ready for discharge.  Continue IV Solu-Medrol : Increased to 80 mg IV every 12 hours.  Continue with current nebs and oral Zithromax  - Counseled regarding tobacco cessation  Hypertension - Continue amlodipine , losartan  and hydrochlorothiazide   Hypokalemia - Resolved  Hyperglycemia - Possibly from steroid use.  A1c 5.7 on 08/05/2024  DVT prophylaxis: Lovenox  Code Status: Full Family Communication: Daughter at bedside Disposition Plan: Status is: Inpatient Remains inpatient appropriate because: Of severity of illness    Consultants: None  Procedures: None  Antimicrobials: Zithromax  from 09/05/2024 onwards   Subjective: Patient seen and examined at bedside.  Continues to wheeze and very short of breath with minimal exertion.  Does not feel ready to go home today as well.  No fever or vomiting reported.   Objective: Vitals:   09/09/24 0337 09/09/24 0429 09/09/24 0755 09/09/24 1004  BP:  (!) 159/66  (!) 159/66  Pulse:  71  71  Resp:  20  20  Temp:  97.8 F (36.6 C)  97.8 F (36.6 C)  TempSrc:  Oral  Oral  SpO2: 97% 100% (S) 99%   Weight:    78 kg  Height:    5' 6 (1.676 m)    Intake/Output Summary (Last 24 hours) at 09/09/2024 1015 Last data filed at 09/09/2024 0900 Gross per 24 hour  Intake 140 ml  Output --  Net 140 ml    Filed Weights   09/09/24 1004  Weight: 78 kg    Examination:  General:  No acute distress and currently on 2 L oxygen  by nasal cannula respiratory: Decreased breath sounds at bases bilaterally with significant wheezing CVS: Rate mostly controlled; S1 and S2 are heard  abdominal: Soft, nontender, distended slightly; no organomegaly, normal bowel sounds are heard  extremities: Trace lower extremity edema; no cyanosis  Data Reviewed: I have personally reviewed following labs and imaging studies  CBC: Recent Labs  Lab 09/03/24 0447 09/05/24 2131 09/06/24 0439  WBC 9.1 8.9 10.3  HGB 11.4* 11.5* 11.7*  HCT 37.8 37.1 38.2  MCV 88.5 86.5 87.2  PLT 285 292 317   Basic Metabolic Panel: Recent Labs  Lab 09/03/24 0447 09/05/24 2131 09/06/24 0439  NA 137 140 138  K 4.3 3.4* 4.4  CL 101 102 100  CO2 28 28 26   GLUCOSE 290* 140* 254*  BUN 15 7* 8  CREATININE 0.74 0.71 0.76  CALCIUM  8.1* 8.4* 8.4*  MG  --   --  1.9   GFR: Estimated Creatinine Clearance: 66 mL/min (by C-G formula based on SCr of 0.76 mg/dL). Liver Function Tests: No results for input(s): AST, ALT, ALKPHOS, BILITOT, PROT, ALBUMIN in the last 168 hours. No results for input(s): LIPASE, AMYLASE in the last 168 hours. No results for input(s): AMMONIA in the last 168 hours. Coagulation Profile: No results for input(s): INR, PROTIME in the last 168 hours. Cardiac Enzymes: No results for input(s): CKTOTAL, CKMB, CKMBINDEX, TROPONINI in the last 168 hours. BNP (last 3 results) No results for input(s): PROBNP  in the last 8760 hours. HbA1C: No results for input(s): HGBA1C in the last 72 hours. CBG: No results for input(s): GLUCAP in the last 168 hours. Lipid Profile: No results for input(s): CHOL, HDL, LDLCALC, TRIG, CHOLHDL, LDLDIRECT in the last 72 hours. Thyroid  Function Tests: No results for input(s): TSH, T4TOTAL, FREET4, T3FREE, THYROIDAB in the last 72 hours. Anemia Panel: No results for input(s): VITAMINB12, FOLATE,  FERRITIN, TIBC, IRON, RETICCTPCT in the last 72 hours. Sepsis Labs: No results for input(s): PROCALCITON, LATICACIDVEN in the last 168 hours.  Recent Results (from the past 240 hours)  Resp panel by RT-PCR (RSV, Flu A&B, Covid) Anterior Nasal Swab     Status: None   Collection Time: 09/05/24 10:25 PM   Specimen: Anterior Nasal Swab  Result Value Ref Range Status   SARS Coronavirus 2 by RT PCR NEGATIVE NEGATIVE Final    Comment: (NOTE) SARS-CoV-2 target nucleic acids are NOT DETECTED.  The SARS-CoV-2 RNA is generally detectable in upper respiratory specimens during the acute phase of infection. The lowest concentration of SARS-CoV-2 viral copies this assay can detect is 138 copies/mL. A negative result does not preclude SARS-Cov-2 infection and should not be used as the sole basis for treatment or other patient management decisions. A negative result may occur with  improper specimen collection/handling, submission of specimen other than nasopharyngeal swab, presence of viral mutation(s) within the areas targeted by this assay, and inadequate number of viral copies(<138 copies/mL). A negative result must be combined with clinical observations, patient history, and epidemiological information. The expected result is Negative.  Fact Sheet for Patients:  bloggercourse.com  Fact Sheet for Healthcare Providers:  seriousbroker.it  This test is no t yet approved or cleared by the [place]  FDA and  has been authorized for detection and/or diagnosis of SARS-CoV-2 by FDA under an Emergency Use Authorization (EUA). This EUA will remain  in effect (meaning this test can be used) for the duration of the COVID-19 declaration under Section 564(b)(1) of the Act, 21 U.S.C.section 360bbb-3(b)(1), unless the authorization is terminated  or revoked sooner.       Influenza A by PCR NEGATIVE NEGATIVE Final   Influenza B by PCR  NEGATIVE NEGATIVE Final    Comment: (NOTE) The Xpert Xpress SARS-CoV-2/FLU/RSV plus assay is intended as an aid in the diagnosis of influenza from Nasopharyngeal swab specimens and should not be used as a sole basis for treatment. Nasal washings and aspirates are unacceptable for Xpert Xpress SARS-CoV-2/FLU/RSV testing.  Fact Sheet for Patients: bloggercourse.com  Fact Sheet for Healthcare Providers: seriousbroker.it  This test is not yet approved or cleared by the [place]  FDA and has been authorized for detection and/or diagnosis of SARS-CoV-2 by FDA under an Emergency Use Authorization (EUA). This EUA will remain in effect (meaning this test can be used) for the duration of the COVID-19 declaration under Section 564(b)(1) of the Act, 21 U.S.C. section 360bbb-3(b)(1), unless the authorization is terminated or revoked.     Resp Syncytial Virus by PCR NEGATIVE NEGATIVE Final    Comment: (NOTE) Fact Sheet for Patients: bloggercourse.com  Fact Sheet for Healthcare Providers: seriousbroker.it  This test is not yet approved or cleared by the [place]  FDA and has been authorized for detection and/or diagnosis of SARS-CoV-2 by FDA under an Emergency Use Authorization (EUA). This EUA will remain in effect (meaning this test can be used) for the duration of the COVID-19 declaration under Section 564(b)(1) of the Act, 21 U.S.C. section 360bbb-3(b)(1), unless the authorization  is terminated or revoked.  Performed at [facility], [address], [place], [place] [address]          Radiology Studies: No results found.       Scheduled Meds:  amLODipine   10 mg Oral Daily   budesonide  (PULMICORT ) nebulizer solution  0.5 mg Nebulization BID   enoxaparin  (LOVENOX ) injection  40 mg Subcutaneous Q24H   losartan   50 mg Oral Daily   And    hydrochlorothiazide   12.5 mg Oral Daily   ipratropium-albuterol   3 mL Nebulization Q4H   methylPREDNISolone  (SOLU-MEDROL ) injection  40 mg Intravenous Q12H   metoprolol  tartrate  50 mg Oral BID   sodium chloride  flush  3 mL Intravenous Q12H   Continuous Infusions:        Sophie Mao, MD [facility] 09/09/2024, 10:15 AM

## 2024-09-09 NOTE — Plan of Care (Signed)

## 2024-09-10 ENCOUNTER — Inpatient Hospital Stay (HOSPITAL_COMMUNITY)

## 2024-09-10 ENCOUNTER — Telehealth: Payer: Self-pay

## 2024-09-10 DIAGNOSIS — J441 Chronic obstructive pulmonary disease with (acute) exacerbation: Secondary | ICD-10-CM | POA: Diagnosis not present

## 2024-09-10 MED ORDER — FUROSEMIDE 10 MG/ML IJ SOLN
60.0000 mg | Freq: Once | INTRAMUSCULAR | Status: AC
  Administered 2024-09-10: 60 mg via INTRAVENOUS
  Filled 2024-09-10: qty 6

## 2024-09-10 MED ORDER — IOHEXOL 350 MG/ML SOLN
75.0000 mL | Freq: Once | INTRAVENOUS | Status: AC | PRN
  Administered 2024-09-10: 75 mL via INTRAVENOUS

## 2024-09-10 NOTE — Progress Notes (Signed)
 I attest to student documentation.  Brek Reece V. Tashaun Obey, MSN-RN Nursing Faculty/Clinical Instructor [facility] Associate Degree Nursing Program

## 2024-09-10 NOTE — Progress Notes (Signed)
 PROGRESS NOTE    Christie TRELOAR  FMW:[id number] DOB: June 06, 1951 DOA: 09/05/2024 PCP: Elnora Ip, MD   Brief Narrative:  73 year old female with history of hypertension, COPD with recent admission for COPD exacerbation from 08/30/2024 to 09/03/2024 presented with worsening shortness of breath.  She was admitted for COPD exacerbation.  Assessment & Plan:   COPD exacerbation Tobacco abuse -Currently on room air.  Influenza/COVID/RSV PCR negative.  Chest x-ray showed hyperinflation and bronchial thickening. - Continues to have significant wheezing: Still not ready for discharge.  Continue IV Solu-Medrol  current dose.  Continue with current nebs and oral Zithromax .  Will get CT of the chest. - Counseled regarding tobacco cessation  Hypertension - Continue amlodipine , losartan  and hydrochlorothiazide   Hypokalemia - Resolved  Hyperglycemia - Possibly from steroid use.  A1c 5.7 on 08/05/2024  DVT prophylaxis: Lovenox  Code Status: Full Family Communication: Daughter at bedside Disposition Plan: Status is: Inpatient Remains inpatient appropriate because: Of severity of illness    Consultants: None  Procedures: None  Antimicrobials: Zithromax  from 09/05/2024 onwards   Subjective: Patient seen and examined at bedside.  Continues to have severe wheezing with intermittent cough.  Does not feel ready to go home today as well.  No fever or vomiting reported.   Objective: Vitals:   09/10/24 0300 09/10/24 0510 09/10/24 0801 09/10/24 0803  BP:  (!) 159/63    Pulse:  83    Resp:  (!) 24    Temp:  (!) 96.9 F (36.1 C)    TempSrc:      SpO2: 98% 100% 98% 98%  Weight:      Height:        Intake/Output Summary (Last 24 hours) at 09/10/2024 0914 Last data filed at 09/10/2024 0700 Gross per 24 hour  Intake 240 ml  Output --  Net 240 ml    Filed Weights   09/09/24 1004  Weight: 78 kg    Examination:  General: Remains on 2 L oxygen  via nasal cannula and in  no distress. respiratory: Bilateral decreased breath sounds at bases with scattered crackles CVS: S1-S2 heard; rate currently controlled abdominal: Soft, nontender, mildly distended; no organomegaly, bowel sounds normally heard  extremities: No clubbing; mild lower extremity edema present  Data Reviewed: I have personally reviewed following labs and imaging studies  CBC: Recent Labs  Lab 09/05/24 2131 09/06/24 0439  WBC 8.9 10.3  HGB 11.5* 11.7*  HCT 37.1 38.2  MCV 86.5 87.2  PLT 292 317   Basic Metabolic Panel: Recent Labs  Lab 09/05/24 2131 09/06/24 0439  NA 140 138  K 3.4* 4.4  CL 102 100  CO2 28 26  GLUCOSE 140* 254*  BUN 7* 8  CREATININE 0.71 0.76  CALCIUM  8.4* 8.4*  MG  --  1.9   GFR: Estimated Creatinine Clearance: 66 mL/min (by C-G formula based on SCr of 0.76 mg/dL). Liver Function Tests: No results for input(s): AST, ALT, ALKPHOS, BILITOT, PROT, ALBUMIN in the last 168 hours. No results for input(s): LIPASE, AMYLASE in the last 168 hours. No results for input(s): AMMONIA in the last 168 hours. Coagulation Profile: No results for input(s): INR, PROTIME in the last 168 hours. Cardiac Enzymes: No results for input(s): CKTOTAL, CKMB, CKMBINDEX, TROPONINI in the last 168 hours. BNP (last 3 results) No results for input(s): PROBNP in the last 8760 hours. HbA1C: No results for input(s): HGBA1C in the last 72 hours. CBG: No results for input(s): GLUCAP in the last 168 hours. Lipid Profile: No results for  input(s): CHOL, HDL, LDLCALC, TRIG, CHOLHDL, LDLDIRECT in the last 72 hours. Thyroid  Function Tests: No results for input(s): TSH, T4TOTAL, FREET4, T3FREE, THYROIDAB in the last 72 hours. Anemia Panel: No results for input(s): VITAMINB12, FOLATE, FERRITIN, TIBC, IRON, RETICCTPCT in the last 72 hours. Sepsis Labs: No results for input(s): PROCALCITON, LATICACIDVEN in the last 168  hours.  Recent Results (from the past 240 hours)  Resp panel by RT-PCR (RSV, Flu A&B, Covid) Anterior Nasal Swab     Status: None   Collection Time: 09/05/24 10:25 PM   Specimen: Anterior Nasal Swab  Result Value Ref Range Status   SARS Coronavirus 2 by RT PCR NEGATIVE NEGATIVE Final    Comment: (NOTE) SARS-CoV-2 target nucleic acids are NOT DETECTED.  The SARS-CoV-2 RNA is generally detectable in upper respiratory specimens during the acute phase of infection. The lowest concentration of SARS-CoV-2 viral copies this assay can detect is 138 copies/mL. A negative result does not preclude SARS-Cov-2 infection and should not be used as the sole basis for treatment or other patient management decisions. A negative result may occur with  improper specimen collection/handling, submission of specimen other than nasopharyngeal swab, presence of viral mutation(s) within the areas targeted by this assay, and inadequate number of viral copies(<138 copies/mL). A negative result must be combined with clinical observations, patient history, and epidemiological information. The expected result is Negative.  Fact Sheet for Patients:  bloggercourse.com  Fact Sheet for Healthcare Providers:  seriousbroker.it  This test is no t yet approved or cleared by the [place]  FDA and  has been authorized for detection and/or diagnosis of SARS-CoV-2 by FDA under an Emergency Use Authorization (EUA). This EUA will remain  in effect (meaning this test can be used) for the duration of the COVID-19 declaration under Section 564(b)(1) of the Act, 21 U.S.C.section 360bbb-3(b)(1), unless the authorization is terminated  or revoked sooner.       Influenza A by PCR NEGATIVE NEGATIVE Final   Influenza B by PCR NEGATIVE NEGATIVE Final    Comment: (NOTE) The Xpert Xpress SARS-CoV-2/FLU/RSV plus assay is intended as an aid in the diagnosis of influenza from  Nasopharyngeal swab specimens and should not be used as a sole basis for treatment. Nasal washings and aspirates are unacceptable for Xpert Xpress SARS-CoV-2/FLU/RSV testing.  Fact Sheet for Patients: bloggercourse.com  Fact Sheet for Healthcare Providers: seriousbroker.it  This test is not yet approved or cleared by the [place]  FDA and has been authorized for detection and/or diagnosis of SARS-CoV-2 by FDA under an Emergency Use Authorization (EUA). This EUA will remain in effect (meaning this test can be used) for the duration of the COVID-19 declaration under Section 564(b)(1) of the Act, 21 U.S.C. section 360bbb-3(b)(1), unless the authorization is terminated or revoked.     Resp Syncytial Virus by PCR NEGATIVE NEGATIVE Final    Comment: (NOTE) Fact Sheet for Patients: bloggercourse.com  Fact Sheet for Healthcare Providers: seriousbroker.it  This test is not yet approved or cleared by the [place]  FDA and has been authorized for detection and/or diagnosis of SARS-CoV-2 by FDA under an Emergency Use Authorization (EUA). This EUA will remain in effect (meaning this test can be used) for the duration of the COVID-19 declaration under Section 564(b)(1) of the Act, 21 U.S.C. section 360bbb-3(b)(1), unless the authorization is terminated or revoked.  Performed at [facility], [address], [place], [place] [address]          Radiology Studies: No results found.  Scheduled Meds:  amLODipine   10 mg Oral Daily   budesonide  (PULMICORT ) nebulizer solution  0.5 mg Nebulization BID   enoxaparin  (LOVENOX ) injection  40 mg Subcutaneous Q24H   losartan   50 mg Oral Daily   And   hydrochlorothiazide   12.5 mg Oral Daily   ipratropium-albuterol   3 mL Nebulization Q4H   methylPREDNISolone  (SOLU-MEDROL ) injection  80 mg Intravenous Q12H    metoprolol  tartrate  50 mg Oral BID   sodium chloride  flush  3 mL Intravenous Q12H   Continuous Infusions:        Sophie Mao, MD [facility] 09/10/2024, 9:14 AM

## 2024-09-11 ENCOUNTER — Inpatient Hospital Stay (HOSPITAL_COMMUNITY)

## 2024-09-11 DIAGNOSIS — R0609 Other forms of dyspnea: Secondary | ICD-10-CM | POA: Diagnosis not present

## 2024-09-11 DIAGNOSIS — J441 Chronic obstructive pulmonary disease with (acute) exacerbation: Secondary | ICD-10-CM | POA: Diagnosis not present

## 2024-09-11 LAB — ECHOCARDIOGRAM COMPLETE
Area-P 1/2: 2.78 cm2
Height: 66 in
S' Lateral: 2.3 cm
Weight: 2751.34 [oz_av]

## 2024-09-11 LAB — BASIC METABOLIC PANEL WITH GFR
Anion gap: 11 (ref 5–15)
BUN: 22 mg/dL (ref 8–23)
CO2: 37 mmol/L — ABNORMAL HIGH (ref 22–32)
Calcium: 9 mg/dL (ref 8.9–10.3)
Chloride: 88 mmol/L — ABNORMAL LOW (ref 98–111)
Creatinine, Ser: 0.69 mg/dL (ref 0.44–1.00)
GFR, Estimated: >60 mL/min (ref >60)
Glucose, Bld: 394 mg/dL — ABNORMAL HIGH (ref 70–99)
Potassium: 4 mmol/L (ref 3.5–5.1)
Sodium: 136 mmol/L (ref 135–145)

## 2024-09-11 LAB — CBC WITH DIFFERENTIAL/PLATELET
Abs Immature Granulocytes: 0.32 K/uL — ABNORMAL HIGH (ref 0.00–0.07)
Basophils Absolute: 0 K/uL (ref 0.0–0.1)
Basophils Relative: 0 %
Eosinophils Absolute: 0 K/uL (ref 0.0–0.5)
Eosinophils Relative: 0 %
HCT: 42.5 % (ref 36.0–46.0)
Hemoglobin: 12.7 g/dL (ref 12.0–15.0)
Immature Granulocytes: 3 %
Lymphocytes Relative: 6 %
Lymphs Abs: 0.7 K/uL (ref 0.7–4.0)
MCH: 26.3 pg (ref 26.0–34.0)
MCHC: 29.9 g/dL — ABNORMAL LOW (ref 30.0–36.0)
MCV: 88.2 fL (ref 80.0–100.0)
Monocytes Absolute: 0.6 K/uL (ref 0.1–1.0)
Monocytes Relative: 5 %
Neutro Abs: 11.1 K/uL — ABNORMAL HIGH (ref 1.7–7.7)
Neutrophils Relative %: 86 %
Platelets: 384 K/uL (ref 150–400)
RBC: 4.82 MIL/uL (ref 3.87–5.11)
RDW: 14.6 % (ref 11.5–15.5)
WBC: 12.8 K/uL — ABNORMAL HIGH (ref 4.0–10.5)
nRBC: 0 % (ref 0.0–0.2)

## 2024-09-11 LAB — GLUCOSE, CAPILLARY
Glucose-Capillary: 335 mg/dL — ABNORMAL HIGH (ref 70–99)
Glucose-Capillary: 82 mg/dL (ref 70–99)
Glucose-Capillary: 260 mg/dL — ABNORMAL HIGH (ref 70–99)

## 2024-09-11 LAB — MAGNESIUM: Magnesium: 2.6 mg/dL — ABNORMAL HIGH (ref 1.7–2.4)

## 2024-09-11 MED ORDER — METHYLPREDNISOLONE SODIUM SUCC 125 MG IJ SOLR
125.0000 mg | Freq: Two times a day (BID) | INTRAMUSCULAR | Status: DC
  Administered 2024-09-11 – 2024-09-13 (×4): 125 mg via INTRAVENOUS
  Filled 2024-09-11 (×4): qty 2

## 2024-09-11 MED ORDER — METHYLPREDNISOLONE SODIUM SUCC 125 MG IJ SOLR
80.0000 mg | Freq: Once | INTRAMUSCULAR | Status: AC
  Administered 2024-09-11: 80 mg via INTRAVENOUS

## 2024-09-11 MED ORDER — FUROSEMIDE 10 MG/ML IJ SOLN
40.0000 mg | Freq: Once | INTRAMUSCULAR | Status: AC
  Administered 2024-09-11: 40 mg via INTRAVENOUS
  Filled 2024-09-11: qty 4

## 2024-09-11 MED ORDER — INSULIN ASPART 100 UNIT/ML IJ SOLN
0.0000 [IU] | Freq: Three times a day (TID) | INTRAMUSCULAR | Status: DC
  Administered 2024-09-11: 15 [IU] via SUBCUTANEOUS
  Administered 2024-09-12: 11 [IU] via SUBCUTANEOUS
  Administered 2024-09-12 (×2): 15 [IU] via SUBCUTANEOUS
  Administered 2024-09-13: 11 [IU] via SUBCUTANEOUS
  Administered 2024-09-13: 15 [IU] via SUBCUTANEOUS
  Filled 2024-09-11 (×2): qty 11
  Filled 2024-09-11 (×4): qty 15

## 2024-09-11 MED ORDER — INSULIN ASPART 100 UNIT/ML IJ SOLN
0.0000 [IU] | Freq: Every day | INTRAMUSCULAR | Status: DC
  Administered 2024-09-11: 3 [IU] via SUBCUTANEOUS
  Filled 2024-09-11: qty 3

## 2024-09-11 NOTE — Evaluation (Signed)
 Physical Therapy Evaluation Patient Details Name: [name] MRN: [id number] DOB: 1951/05/14 Today's Date: 09/11/2024  History of Present Illness  73 yo female admitted with COPD exac. Hx of breast Ca, COPD, OA, depression, obesity, DM  Clinical Impression  On eval, pt was Min A for mobility. She ambulated ~85 feet with use of hallway handrail vs furniture walking. Unsteady-also requiring steadying assistance from therapist. Pt presents with general weakness, decreased activity tolerance, and impaired gait/balance. O2:100% on 2L at rest, 97% on RA at rest, 93% on RA with ambulation. Ambulation was effortful. Wheezing + dyspnea 3/4 with ambulation (also having wheezing at rest). No family present during session-pt reports she lives with her husband and is ind at baseline. Will plan to follow and progress activity as tolerated. Recommend HHPT and RW for ambulation safety.         If plan is discharge home, recommend the following: A little help with walking and/or transfers;A little help with bathing/dressing/bathroom;Assistance with cooking/housework;Assist for transportation;Help with stairs or ramp for entrance   Can travel by private vehicle        Equipment Recommendations Rolling walker (2 wheels)  Recommendations for Other Services       Functional Status Assessment Patient has had a recent decline in their functional status and demonstrates the ability to make significant improvements in function in a reasonable and predictable amount of time.     Precautions / Restrictions Precautions Precautions: Fall Precaution/Restrictions Comments: monitor O2 Restrictions Weight Bearing Restrictions Per Provider Order: No      Mobility  Bed Mobility Overal bed mobility: Modified Independent                  Transfers Overall transfer level: Needs assistance   Transfers: Sit to/from Stand Sit to Stand: Contact guard assist           General transfer comment:  Unsteady. Increased time. Cues for safety.    Ambulation/Gait Ambulation/Gait assistance: Min assist Gait Distance (Feet): 85 Feet Assistive device: None (hallway handrail) Gait Pattern/deviations: Step-through pattern, Decreased stride length, Staggering right, Staggering left, Drifts right/left       General Gait Details: Unsteady. furniture walking for majority of ambulation distance. Assist to stabilize pt throughout distance as well. Wheezing + dyspnea 3/4. Standing rest break taken after ~45 feet. O2 93% on RA.  Stairs            Wheelchair Mobility     Tilt Bed    Modified Rankin (Stroke Patients Only)       Balance Overall balance assessment: Needs assistance         Standing balance support: During functional activity Standing balance-Leahy Scale: Poor                               Pertinent Vitals/Pain Pain Assessment Pain Assessment: No/denies pain    Home Living Family/patient expects to be discharged to:: Private residence Living Arrangements: Spouse/significant other Available Help at Discharge: Family Type of Home: House Home Access: Stairs to enter Entrance Stairs-Rails: None Entrance Stairs-Number of Steps: 2-front; 1-back   Home Layout: Able to live on main level with bedroom/bathroom Home Equipment: None      Prior Function Prior Level of Function : Independent/Modified Independent                     Extremity/Trunk Assessment   Upper Extremity Assessment Upper Extremity Assessment: Generalized weakness  Lower Extremity Assessment Lower Extremity Assessment: Generalized weakness    Cervical / Trunk Assessment Cervical / Trunk Assessment: Normal  Communication   Communication Communication: No apparent difficulties    Cognition Arousal: Alert Behavior During Therapy: WFL for tasks assessed/performed   PT - Cognitive impairments: No apparent impairments                          Following commands: Intact       Cueing Cueing Techniques: Verbal cues     General Comments      Exercises     Assessment/Plan    PT Assessment Patient needs continued PT services  PT Problem List Decreased strength;Decreased range of motion;Decreased activity tolerance;Decreased balance;Decreased mobility;Decreased knowledge of use of DME       PT Treatment Interventions DME instruction;Gait training;Functional mobility training;Therapeutic activities;Therapeutic exercise;Patient/family education;Balance training    PT Goals (Current goals can be found in the Care Plan section)  Acute Rehab PT Goals Patient Stated Goal: home soon PT Goal Formulation: With patient Time For Goal Achievement: 09/25/24 Potential to Achieve Goals: Good    Frequency Min 3X/week     Co-evaluation               AM-PAC PT 6 Clicks Mobility  Outcome Measure Help needed turning from your back to your side while in a flat bed without using bedrails?: None Help needed moving from lying on your back to sitting on the side of a flat bed without using bedrails?: None Help needed moving to and from a bed to a chair (including a wheelchair)?: A Little Help needed standing up from a chair using your arms (e.g., wheelchair or bedside chair)?: A Little Help needed to walk in hospital room?: A Little Help needed climbing 3-5 steps with a railing? : A Little 6 Click Score: 20    End of Session   Activity Tolerance: Patient limited by fatigue (limited by dyspnea + wheezing) Patient left: in bed;with call bell/phone within reach   PT Visit Diagnosis: Muscle weakness (generalized) (M62.81);Difficulty in walking, not elsewhere classified (R26.2);Unsteadiness on feet (R26.81)    Time: 8553-8493 PT Time Calculation (min) (ACUTE ONLY): 20 min   Charges:   PT Evaluation $PT Eval Low Complexity: 1 Low   PT General Charges $$ ACUTE PT VISIT: 1 Visit          Dannial SQUIBB, PT Acute  Rehabilitation  Office: [phone]

## 2024-09-11 NOTE — Progress Notes (Addendum)
 PROGRESS NOTE    [name]  FMW:[id number] DOB: Jul 17, 1951 DOA: 09/05/2024 PCP: Elnora Ip, MD   Brief Narrative:  73 year old female with history of hypertension, COPD with recent admission for COPD exacerbation from 08/30/2024 to 09/03/2024 presented with worsening shortness of breath.  She was admitted for COPD exacerbation and started on IV steroids.  Assessment & Plan:   COPD exacerbation Tobacco abuse -Currently on room air.  Influenza/COVID/RSV PCR negative.  Chest x-ray showed hyperinflation and bronchial thickening. - Still wheezing a lot.  Patient does not feel ready to be discharged today.  Will increase Solu-Medrol  to 125 mg IV every 12 hours for today.  Continue with current nebs.  CTA chest on 09/10/2024 did not show any pulmonary embolism with no signs of pneumonia but showed emphysema.  Patient was given 1 dose of IV Lasix on 09/10/2024; will give another dose today.  Get 2D echo - Counseled regarding tobacco cessation  Hypertension - Continue amlodipine , losartan  and hydrochlorothiazide   Hypokalemia - Resolved  Hyperglycemia - Possibly from steroid use.  A1c 5.7 on 08/05/2024.  Start CBGs with SSI  DVT prophylaxis: Lovenox  Code Status: Full Family Communication: Daughter at bedside on 09/09/2024 Disposition Plan: Status is: Inpatient Remains inpatient appropriate because: Of severity of illness    Consultants: None  Procedures: None  Antimicrobials: Zithromax  from 09/05/2024 onwards   Subjective: Patient seen and examined at bedside.  Still has severe squeezing with intermittent coughing.  Does not feel ready to go home today.  No fever, vomiting, abdominal reported  objective: Vitals:   09/10/24 2038 09/11/24 0307 09/11/24 0507 09/11/24 0750  BP:   (!) 146/65   Pulse:   80   Resp:   (!) 22   Temp:   97.9 F (36.6 C)   TempSrc:      SpO2: 99% 97% 95% 97%  Weight:      Height:        Intake/Output Summary (Last 24 hours) at  09/11/2024 1052 Last data filed at 09/10/2024 1230 Gross per 24 hour  Intake 240 ml  Output --  Net 240 ml    Filed Weights   09/09/24 1004  Weight: 78 kg    Examination:  General: Continues to require 2 L oxygen  via nasal.  No acute distress.  Looks chronically ill and deconditioned.   Respiratory: Decreased breath sounds at bases bilaterally with diffuse severe wheezing and intermittent tachypnea CVS: Rate mostly controlled currently; S1 and S2 are heard  abdominal: Soft, nontender, remains slightly distended; no organomegaly, normal bowel sounds are heard extremities: Trace lower extremity edema present; no cyanosis Data Reviewed: I have personally reviewed following labs and imaging studies  CBC: Recent Labs  Lab 09/05/24 2131 09/06/24 0439 09/11/24 0541  WBC 8.9 10.3 12.8*  NEUTROABS  --   --  11.1*  HGB 11.5* 11.7* 12.7  HCT 37.1 38.2 42.5  MCV 86.5 87.2 88.2  PLT 292 317 384   Basic Metabolic Panel: Recent Labs  Lab 09/05/24 2131 09/06/24 0439 09/11/24 0541  NA 140 138 136  K 3.4* 4.4 4.0  CL 102 100 88*  CO2 28 26 37*  GLUCOSE 140* 254* 394*  BUN 7* 8 22  CREATININE 0.71 0.76 0.69  CALCIUM  8.4* 8.4* 9.0  MG  --  1.9 2.6*   GFR: Estimated Creatinine Clearance: 66 mL/min (by C-G formula based on SCr of 0.69 mg/dL). Liver Function Tests: No results for input(s): AST, ALT, ALKPHOS, BILITOT, PROT, ALBUMIN in the last 168  hours. No results for input(s): LIPASE, AMYLASE in the last 168 hours. No results for input(s): AMMONIA in the last 168 hours. Coagulation Profile: No results for input(s): INR, PROTIME in the last 168 hours. Cardiac Enzymes: No results for input(s): CKTOTAL, CKMB, CKMBINDEX, TROPONINI in the last 168 hours. BNP (last 3 results) No results for input(s): PROBNP in the last 8760 hours. HbA1C: No results for input(s): HGBA1C in the last 72 hours. CBG: No results for input(s): GLUCAP in the last 168  hours. Lipid Profile: No results for input(s): CHOL, HDL, LDLCALC, TRIG, CHOLHDL, LDLDIRECT in the last 72 hours. Thyroid  Function Tests: No results for input(s): TSH, T4TOTAL, FREET4, T3FREE, THYROIDAB in the last 72 hours. Anemia Panel: No results for input(s): VITAMINB12, FOLATE, FERRITIN, TIBC, IRON, RETICCTPCT in the last 72 hours. Sepsis Labs: No results for input(s): PROCALCITON, LATICACIDVEN in the last 168 hours.  Recent Results (from the past 240 hours)  Resp panel by RT-PCR (RSV, Flu A&B, Covid) Anterior Nasal Swab     Status: None   Collection Time: 09/05/24 10:25 PM   Specimen: Anterior Nasal Swab  Result Value Ref Range Status   SARS Coronavirus 2 by RT PCR NEGATIVE NEGATIVE Final    Comment: (NOTE) SARS-CoV-2 target nucleic acids are NOT DETECTED.  The SARS-CoV-2 RNA is generally detectable in upper respiratory specimens during the acute phase of infection. The lowest concentration of SARS-CoV-2 viral copies this assay can detect is 138 copies/mL. A negative result does not preclude SARS-Cov-2 infection and should not be used as the sole basis for treatment or other patient management decisions. A negative result may occur with  improper specimen collection/handling, submission of specimen other than nasopharyngeal swab, presence of viral mutation(s) within the areas targeted by this assay, and inadequate number of viral copies(<138 copies/mL). A negative result must be combined with clinical observations, patient history, and epidemiological information. The expected result is Negative.  Fact Sheet for Patients:  bloggercourse.com  Fact Sheet for Healthcare Providers:  seriousbroker.it  This test is no t yet approved or cleared by the [place]  FDA and  has been authorized for detection and/or diagnosis of SARS-CoV-2 by FDA under an Emergency Use Authorization (EUA). This  EUA will remain  in effect (meaning this test can be used) for the duration of the COVID-19 declaration under Section 564(b)(1) of the Act, 21 U.S.C.section 360bbb-3(b)(1), unless the authorization is terminated  or revoked sooner.       Influenza A by PCR NEGATIVE NEGATIVE Final   Influenza B by PCR NEGATIVE NEGATIVE Final    Comment: (NOTE) The Xpert Xpress SARS-CoV-2/FLU/RSV plus assay is intended as an aid in the diagnosis of influenza from Nasopharyngeal swab specimens and should not be used as a sole basis for treatment. Nasal washings and aspirates are unacceptable for Xpert Xpress SARS-CoV-2/FLU/RSV testing.  Fact Sheet for Patients: bloggercourse.com  Fact Sheet for Healthcare Providers: seriousbroker.it  This test is not yet approved or cleared by the [place]  FDA and has been authorized for detection and/or diagnosis of SARS-CoV-2 by FDA under an Emergency Use Authorization (EUA). This EUA will remain in effect (meaning this test can be used) for the duration of the COVID-19 declaration under Section 564(b)(1) of the Act, 21 U.S.C. section 360bbb-3(b)(1), unless the authorization is terminated or revoked.     Resp Syncytial Virus by PCR NEGATIVE NEGATIVE Final    Comment: (NOTE) Fact Sheet for Patients: bloggercourse.com  Fact Sheet for Healthcare Providers: seriousbroker.it  This test is not  yet approved or cleared by the [place]  FDA and has been authorized for detection and/or diagnosis of SARS-CoV-2 by FDA under an Emergency Use Authorization (EUA). This EUA will remain in effect (meaning this test can be used) for the duration of the COVID-19 declaration under Section 564(b)(1) of the Act, 21 U.S.C. section 360bbb-3(b)(1), unless the authorization is terminated or revoked.  Performed at [facility], [address], [place], [place] [address]          Radiology Studies: CT Angio Chest Pulmonary Embolism (PE) W or WO Contrast Result Date: 09/10/2024 CLINICAL DATA:  Shortness of breath. EXAM: CT ANGIOGRAPHY CHEST WITH CONTRAST TECHNIQUE: Multidetector CT imaging of the chest was performed using the standard protocol during bolus administration of intravenous contrast. Multiplanar CT image reconstructions and MIPs were obtained to evaluate the vascular anatomy. RADIATION DOSE REDUCTION: This exam was performed according to the departmental dose-optimization program which includes automated exposure control, adjustment of the mA and/or kV according to patient size and/or use of iterative reconstruction technique. CONTRAST:  75mL OMNIPAQUE  IOHEXOL  350 MG/ML SOLN COMPARISON:  11/05/2012. FINDINGS: Cardiovascular: Image quality is degraded by respiratory motion, limiting evaluation of some of the segmental and subsegmental pulmonary arteries. Otherwise, no pulmonary embolus. Atherosclerotic calcification of the aorta and coronary arteries. Enlarged pulmonic trunk and heart. Left ventricular hypertrophy. No pericardial effusion. Mediastinum/Nodes: No pathologically enlarged mediastinal, hilar or axillary lymph nodes. Surgical clips in the right axilla. Postoperative changes in the right breast. Esophagus is grossly unremarkable. Lungs/Pleura: Image quality is degraded by expiratory phase imaging and respiratory motion. Centrilobular and paraseptal emphysema. Subpleural radiation scarring in the anterior right lung. Lungs are otherwise clear. No pleural fluid. Airway is unremarkable. Upper Abdomen: Visualized portions of the liver, adrenal glands, kidneys, spleen, pancreas, stomach and bowel are grossly unremarkable. No upper abdominal adenopathy. Musculoskeletal: Degenerative changes in the spine. Review of the MIP images confirms the above findings. IMPRESSION: 1. Image quality is degraded by respiratory motion and  expiratory phase imaging, limiting the evaluation of some segmental and subsegmental pulmonary arteries. Otherwise, negative for pulmonary embolus. 2. Aortic atherosclerosis (ICD10-I70.0). Coronary artery calcification. 3.  Emphysema (ICD10-J43.9). 4. Enlarged pulmonic trunk, indicative of pulmonary arterial hypertension. Electronically Signed   By: Newell Eke M.D.   On: 09/10/2024 13:36         Scheduled Meds:  amLODipine   10 mg Oral Daily   budesonide  (PULMICORT ) nebulizer solution  0.5 mg Nebulization BID   enoxaparin  (LOVENOX ) injection  40 mg Subcutaneous Q24H   losartan   50 mg Oral Daily   And   hydrochlorothiazide   12.5 mg Oral Daily   ipratropium-albuterol   3 mL Nebulization Q4H   methylPREDNISolone  (SOLU-MEDROL ) injection  80 mg Intravenous Q12H   metoprolol  tartrate  50 mg Oral BID   sodium chloride  flush  3 mL Intravenous Q12H   Continuous Infusions:        Sophie Mao, MD [facility] 09/11/2024, 10:52 AM

## 2024-09-11 NOTE — Plan of Care (Signed)
  Problem: Health Behavior/Discharge Planning: Goal: Ability to manage health-related needs will improve Outcome: Progressing   Problem: Clinical Measurements: Goal: Respiratory complications will improve Outcome: Progressing   Problem: Activity: Goal: Risk for activity intolerance will decrease Outcome: Progressing   Problem: Safety: Goal: Ability to remain free from injury will improve Outcome: Progressing   

## 2024-09-11 NOTE — Plan of Care (Signed)

## 2024-09-11 NOTE — Progress Notes (Signed)
  Echocardiogram 2D Echocardiogram has been performed.  Tinnie FORBES Gosling RDCS 09/11/2024, 3:31 PM

## 2024-09-12 DIAGNOSIS — J441 Chronic obstructive pulmonary disease with (acute) exacerbation: Secondary | ICD-10-CM | POA: Diagnosis not present

## 2024-09-12 DIAGNOSIS — I5032 Chronic diastolic (congestive) heart failure: Secondary | ICD-10-CM | POA: Diagnosis present

## 2024-09-12 LAB — GLUCOSE, CAPILLARY
Glucose-Capillary: 292 mg/dL — ABNORMAL HIGH (ref 70–99)
Glucose-Capillary: 311 mg/dL — ABNORMAL HIGH (ref 70–99)
Glucose-Capillary: 310 mg/dL — ABNORMAL HIGH (ref 70–99)
Glucose-Capillary: 149 mg/dL — ABNORMAL HIGH (ref 70–99)

## 2024-09-12 LAB — CBC WITH DIFFERENTIAL/PLATELET
Abs Immature Granulocytes: 0.26 K/uL — ABNORMAL HIGH (ref 0.00–0.07)
Basophils Absolute: 0 K/uL (ref 0.0–0.1)
Basophils Relative: 0 %
Eosinophils Absolute: 0 K/uL (ref 0.0–0.5)
Eosinophils Relative: 0 %
HCT: 40.8 % (ref 36.0–46.0)
Hemoglobin: 12.8 g/dL (ref 12.0–15.0)
Immature Granulocytes: 2 %
Lymphocytes Relative: 7 %
Lymphs Abs: 1 K/uL (ref 0.7–4.0)
MCH: 27.5 pg (ref 26.0–34.0)
MCHC: 31.4 g/dL (ref 30.0–36.0)
MCV: 87.6 fL (ref 80.0–100.0)
Monocytes Absolute: 0.4 K/uL (ref 0.1–1.0)
Monocytes Relative: 3 %
Neutro Abs: 14 K/uL — ABNORMAL HIGH (ref 1.7–7.7)
Neutrophils Relative %: 88 %
Platelets: 369 K/uL (ref 150–400)
RBC: 4.66 MIL/uL (ref 3.87–5.11)
RDW: 14.6 % (ref 11.5–15.5)
WBC: 15.8 K/uL — ABNORMAL HIGH (ref 4.0–10.5)
nRBC: 0 % (ref 0.0–0.2)

## 2024-09-12 LAB — COMPREHENSIVE METABOLIC PANEL WITH GFR
ALT: 13 U/L (ref 0–44)
AST: 15 U/L (ref 15–41)
Albumin: 3.6 g/dL (ref 3.5–5.0)
Alkaline Phosphatase: 70 U/L (ref 38–126)
Anion gap: 9 (ref 5–15)
BUN: 21 mg/dL (ref 8–23)
CO2: 37 mmol/L — ABNORMAL HIGH (ref 22–32)
Calcium: 8.8 mg/dL — ABNORMAL LOW (ref 8.9–10.3)
Chloride: 88 mmol/L — ABNORMAL LOW (ref 98–111)
Creatinine, Ser: 0.72 mg/dL (ref 0.44–1.00)
GFR, Estimated: >60 mL/min (ref >60)
Glucose, Bld: 252 mg/dL — ABNORMAL HIGH (ref 70–99)
Potassium: 4 mmol/L (ref 3.5–5.1)
Sodium: 134 mmol/L — ABNORMAL LOW (ref 135–145)
Total Bilirubin: 0.6 mg/dL (ref 0.0–1.2)
Total Protein: 6.2 g/dL — ABNORMAL LOW (ref 6.5–8.1)

## 2024-09-12 LAB — MAGNESIUM: Magnesium: 2.5 mg/dL — ABNORMAL HIGH (ref 1.7–2.4)

## 2024-09-12 NOTE — Inpatient Diabetes Management (Signed)
 Inpatient Diabetes Program Recommendations  AACE/ADA: New Consensus Statement on Inpatient Glycemic Control (2015)  Target Ranges:  Prepandial:   less than 140 mg/dL      Peak postprandial:   less than 180 mg/dL (1-2 hours)      Critically ill patients:  140 - 180 mg/dL   Lab Results  Component Value Date   GLUCAP 311 (H) 09/12/2024   HGBA1C 5.7 (H) 08/05/2024    Review of Glycemic Control  Diabetes history: Hyperglycemia, likely from steroid use. HgbA1C - 5.7% Outpatient Diabetes medications: Prednisone  dose pak - 3 tabs, 2 tabs, 1 tab (20 mg tabs) Current orders for Inpatient glycemic control: Novolog  0-20 TID with meals and 0-5 HS, Solumedrol 125 mg Q12H  HgbA1C - 5.7%  Inpatient Diabetes Program Recommendations:    If FBS > 180 mg/dL, consider adding Semglee  8 units at bedtime  If post-prandials > 180 mg/dL, consider adding Novolog  2-3 units TID with meals if eating > 50%, while on steroids.   Continue to follow.   Thank you. Shona Brandy, RD, LDN, CDCES Inpatient Diabetes Coordinator [phone]

## 2024-09-12 NOTE — Hospital Course (Signed)
 73yo with h/o COPD with ongoing tobacco use, HLD, and depression who presented on 10/29 with SOB and wheezing. She has frequent COPD exacerbations requiring admission.  No hypoxia. Given nebs and steroids.

## 2024-09-12 NOTE — Plan of Care (Signed)
  Problem: Education: Goal: Knowledge of General Education information will improve Description: Including pain rating scale, medication(s)/side effects and non-pharmacologic comfort measures Outcome: Progressing   Problem: Clinical Measurements: Goal: Ability to maintain clinical measurements within normal limits will improve Outcome: Progressing Goal: Will remain free from infection Outcome: Progressing Goal: Cardiovascular complication will be avoided Outcome: Progressing   Problem: Nutrition: Goal: Adequate nutrition will be maintained Outcome: Progressing   Problem: Coping: Goal: Level of anxiety will decrease Outcome: Progressing   Problem: Elimination: Goal: Will not experience complications related to bowel motility Outcome: Progressing   Problem: Pain Managment: Goal: General experience of comfort will improve and/or be controlled Outcome: Progressing

## 2024-09-12 NOTE — Assessment & Plan Note (Deleted)
 Body mass index is 30.13 kg/m.SABRA  Weight loss should be encouraged Outpatient PCP/bariatric medicine f/u encouraged Significantly low or high BMI is associated with higher medical risk including morbidity and mortality

## 2024-09-12 NOTE — Plan of Care (Signed)
  Problem: Education: Goal: Knowledge of General Education information will improve Description: Including pain rating scale, medication(s)/side effects and non-pharmacologic comfort measures Outcome: Progressing   Problem: Health Behavior/Discharge Planning: Goal: Ability to manage health-related needs will improve Outcome: Progressing   Problem: Clinical Measurements: Goal: Ability to maintain clinical measurements within normal limits will improve Outcome: Progressing Goal: Will remain free from infection Outcome: Progressing Goal: Diagnostic test results will improve Outcome: Progressing Goal: Respiratory complications will improve Outcome: Progressing   Problem: Activity: Goal: Risk for activity intolerance will decrease Outcome: Progressing   Problem: Nutrition: Goal: Adequate nutrition will be maintained Outcome: Progressing   Problem: Elimination: Goal: Will not experience complications related to urinary retention Outcome: Progressing   Problem: Pain Managment: Goal: General experience of comfort will improve and/or be controlled Outcome: Progressing

## 2024-09-12 NOTE — Assessment & Plan Note (Signed)
 Echo 11/4 with preserved EF, ?HOCM, grade 1 DD, small pericardial effusion Given Lasix but appears compensated Recommend outpatient cardiology f/u

## 2024-09-12 NOTE — Assessment & Plan Note (Deleted)
 Patient was initially hypertensive due to not taking her home medications yesterday Blood pressure is improving Continue home amlodipine , Hyzaar and metoprolol 

## 2024-09-12 NOTE — Assessment & Plan Note (Signed)
 Patient was initially hypertensive due to not taking her home medications yesterday Blood pressure is improving Continue home amlodipine , Hyzaar and metoprolol 

## 2024-09-12 NOTE — Assessment & Plan Note (Deleted)
 A1c 5.7, well controlled Diet controlled at home Started on moderate-scale SSI in the setting of steroids

## 2024-09-12 NOTE — Assessment & Plan Note (Signed)
 Cessation encouraged  Nicotine patch ordered

## 2024-09-12 NOTE — Progress Notes (Signed)
 Progress Note   Patient: [name] FMW:[id number] DOB: 1951/03/28 DOA: 09/05/2024     7 DOS: the patient was seen and examined on 09/12/2024   Brief hospital course: 73yo with h/o COPD with ongoing tobacco use, HLD, and depression who presented on 10/29 with SOB and wheezing. She has frequent COPD exacerbations requiring admission.  No hypoxia. Given nebs and steroids.     Assessment & Plan COPD with acute exacerbation (HCC) Presented with worsening shortness of breath, wheezing Expiratory wheezing and rhonchi on examination Workup negative for evidence of viral or acute bacterial infection IV Solu-Medrol  -> prednisone   Scheduled DuoNebs and as needed albuterol  inhaler Incentive spirometer and flutter valve Currently on room air CTA chest on 09/10/2024 did not show any pulmonary embolism with no signs of pneumonia but showed emphysema   Given 2 doses of Lasix Echo with preserved EF, grade 1 DD, ?HOCM Essential hypertension Patient was initially hypertensive due to not taking her home medications yesterday Blood pressure is improving Continue home amlodipine , Hyzaar and metoprolol  Type 2 diabetes mellitus with complication, without long-term current use of insulin  (HCC) A1c 5.7, well controlled Diet controlled at home Started on moderate-scale SSI in the setting of steroids Tobacco use disorder Cessation encouraged Nicotine  patch ordered Dyslipidemia associated with type 2 diabetes mellitus (HCC)  Chronic diastolic CHF (congestive heart failure) (HCC) Echo 11/4 with preserved EF, ?HOCM, grade 1 DD, small pericardial effusion Given Lasix but appears compensated Recommend outpatient cardiology f/u Class 1 obesity due to excess calories with body mass index (BMI) of 30.0 to 30.9 in adult Body mass index is 30.13 kg/m.SABRA  Weight loss should be encouraged Outpatient PCP/bariatric medicine f/u encouraged Significantly low or high BMI is associated with higher medical risk  including morbidity and mortality       Consultants: None  Procedures: Echocardiogram 11/4  Antibiotics: Azithromycin  x 3 doses  30 Day Unplanned Readmission Risk Score    Flowsheet Row ED to Hosp-Admission (Current) from 09/05/2024 in [facility]  30 Day Unplanned Readmission Risk Score (%) 32.42 Filed at 09/12/2024 1600    This score is the patient's risk of an unplanned readmission within 30 days of being discharged (0 -100%). The score is based on dignosis, age, lab data, medications, orders, and past utilization.   Low:  0-14.9   Medium: 15-21.9   High: 22-29.9   Extreme: 30 and above           Subjective: Still wheezing some, doesn't feel quite better enough to go home.   Objective: Vitals:   09/12/24 1139 09/12/24 1614  BP: (!) 168/67   Pulse: 78   Resp: 20   Temp: 98.9 F (37.2 C)   SpO2: 100% 98%    Intake/Output Summary (Last 24 hours) at 09/12/2024 1856 Last data filed at 09/12/2024 1400 Gross per 24 hour  Intake 717 ml  Output --  Net 717 ml   Filed Weights   09/09/24 1004 09/12/24 0610  Weight: 78 kg 84.7 kg    Exam:  General:  Appears calm and comfortable and is in NAD Eyes:  normal lids, iris ENT:  grossly normal hearing, lips & tongue, mmm Cardiovascular:  RRR. No LE edema.  Respiratory:   Mild diffuse wheezing with fair air movement.  Normal respiratory effort. Abdomen:  soft, NT, ND Skin:  no rash or induration seen on limited exam Musculoskeletal:  grossly normal tone BUE/BLE, good ROM, no bony abnormality Psychiatric:  grossly normal  mood and affect, speech fluent and appropriate, AOx3 Neurologic:  CN 2-12 grossly intact, moves all extremities in coordinated fashion  Data Reviewed: I have reviewed the patient's lab results since admission.  Pertinent labs for today include:   CO2 37 Glucose 252 WBC 15.8 (steroids)     Family Communication: None present  Mobility: PT/OT Consulted and  are recommending - Home Health Pt11/02/2024 1512    Code Status: Full Code   Disposition: Status is: Inpatient Remains inpatient appropriate because: ongoing management     Time spent: 50 minutes  Unresulted Labs (From admission, onward)    None        Author: Delon Herald, MD 09/12/2024 6:56 PM  For on call review [email or web address].

## 2024-09-12 NOTE — Assessment & Plan Note (Deleted)
 Presented with 2 days of worsening shortness of breath, wheezing and increased nonproductive cough Expiratory wheezing and rhonchi on examination. Workup negative for evidence of viral or acute bacterial infection Overnight observation IV Solu-Medrol  -> prednisone   Scheduled DuoNebs and as needed albuterol  inhaler Incentive spirometer and flutter valve Currently on room air.  Influenza/COVID/RSV PCR negative.  Chest x-ray showed hyperinflation and bronchial thickening. - Still wheezing a lot.  Patient does not feel ready to be discharged today.  Will increase Solu-Medrol  to 125 mg IV every 12 hours for today.  Continue with current nebs.  CTA chest on 09/10/2024 did not show any pulmonary embolism with no signs of pneumonia but showed emphysema.  Patient was given 1 dose of IV Lasix on 09/10/2024; will give another dose today.  Get 2D echo - Counseled regarding tobacco cessation

## 2024-09-12 NOTE — Assessment & Plan Note (Deleted)
 Cessation encouraged  Nicotine patch ordered

## 2024-09-12 NOTE — Assessment & Plan Note (Deleted)
 Echo 11/4 with preserved EF, ?HOCM, grade 1 DD, small pericardial effusion Given Lasix but appears compensated Recommend outpatient cardiology f/u

## 2024-09-12 NOTE — Assessment & Plan Note (Signed)
 Presented with worsening shortness of breath, wheezing Expiratory wheezing and rhonchi on examination Workup negative for evidence of viral or acute bacterial infection IV Solu-Medrol  -> prednisone   Scheduled DuoNebs and as needed albuterol  inhaler Incentive spirometer and flutter valve Currently on room air CTA chest on 09/10/2024 did not show any pulmonary embolism with no signs of pneumonia but showed emphysema   Given 2 doses of Lasix Echo with preserved EF, grade 1 DD, ?HOCM

## 2024-09-12 NOTE — Assessment & Plan Note (Signed)
 Body mass index is 30.13 kg/m.SABRA  Weight loss should be encouraged Outpatient PCP/bariatric medicine f/u encouraged Significantly low or high BMI is associated with higher medical risk including morbidity and mortality

## 2024-09-12 NOTE — Assessment & Plan Note (Signed)
 A1c 5.7, well controlled Diet controlled at home Started on moderate-scale SSI in the setting of steroids

## 2024-09-13 ENCOUNTER — Other Ambulatory Visit (HOSPITAL_COMMUNITY): Payer: Self-pay

## 2024-09-13 ENCOUNTER — Ambulatory Visit: Admitting: Student

## 2024-09-13 DIAGNOSIS — J441 Chronic obstructive pulmonary disease with (acute) exacerbation: Secondary | ICD-10-CM | POA: Diagnosis not present

## 2024-09-13 LAB — GLUCOSE, CAPILLARY
Glucose-Capillary: 270 mg/dL — ABNORMAL HIGH (ref 70–99)
Glucose-Capillary: 307 mg/dL — ABNORMAL HIGH (ref 70–99)

## 2024-09-13 MED ORDER — BUSPIRONE HCL 5 MG PO TABS
5.0000 mg | ORAL_TABLET | Freq: Two times a day (BID) | ORAL | 0 refills | Status: AC
  Filled 2024-09-13: qty 60, 30d supply, fill #0

## 2024-09-13 MED ORDER — BENZONATATE 200 MG PO CAPS
200.0000 mg | ORAL_CAPSULE | Freq: Two times a day (BID) | ORAL | 0 refills | Status: AC | PRN
  Filled 2024-09-13: qty 20, 10d supply, fill #0

## 2024-09-13 MED ORDER — IPRATROPIUM-ALBUTEROL 0.5-2.5 (3) MG/3ML IN SOLN
3.0000 mL | Freq: Four times a day (QID) | RESPIRATORY_TRACT | 0 refills | Status: AC
  Filled 2024-09-13: qty 360, 30d supply, fill #0

## 2024-09-13 MED ORDER — PREDNISONE 20 MG PO TABS
ORAL_TABLET | ORAL | 0 refills | Status: AC
  Filled 2024-09-13: qty 18, 9d supply, fill #0

## 2024-09-13 MED ORDER — BUDESONIDE 0.5 MG/2ML IN SUSP
0.5000 mg | Freq: Two times a day (BID) | RESPIRATORY_TRACT | 0 refills | Status: AC
  Filled 2024-09-13: qty 120, 30d supply, fill #0

## 2024-09-13 NOTE — TOC Transition Note (Signed)
 Transition of Care [facility]) - Discharge Note   Patient Details  Name: [name] MRN: [id number] Date of Birth: 02-03-1951  Transition of [facility]) CM/SW Contact:  Jon ONEIDA Anon, RN Phone Number: 09/13/2024, 4:09 PM   Clinical Narrative:    Pt to discharge home with [facility] services. HH PT has been set up with [facility] and HH orders are in place. Home DME RW orders sent to [employer] and RW to be delivered to pt room prior to DC. Pt spouse to provide transportation at time of DC. There are no further ICM needs at this time.      Final next level of care: Home w Home Health Services Barriers to Discharge: Barriers Resolved   Patient Goals and CMS Choice Patient states their goals for this hospitalization and ongoing recovery are:: To return home with [facility] services CMS Medicare.gov Compare Post Acute Care list provided to:: Patient Choice offered to / list presented to : Patient [place] ownership interest in [facility].provided to:: Patient    Discharge Placement                       Discharge Plan and Services Additional resources added to the After Visit Summary for     Discharge Planning Services: CM Consult            DME Arranged: Vannie rolling DME Agency: [employer] Date DME Agency Contacted: 09/13/24 Time DME Agency Contacted: 1500 Representative spoke with at DME Agency: [place] with [employer] HH Arranged: PT HH Agency: [facility] ([facility] Date HH Agency Contacted: 09/13/24 Time HH Agency Contacted: 1330 Representative spoke with at [facility] Agency: [place] with [facility]  Social Drivers of Health (SDOH) Interventions SDOH Screenings   Food Insecurity: No Food Insecurity (09/07/2024)  Recent Concern: Food Insecurity - Food Insecurity Present (08/04/2024)  Housing: Low Risk  (09/07/2024)  Recent Concern: Housing - High Risk (08/04/2024)  Transportation Needs: No Transportation Needs (09/07/2024)  Utilities: Not At  Risk (09/07/2024)  Alcohol  Screen: Low Risk  (12/13/2022)  Depression (PHQ2-9): Medium Risk (07/16/2024)  Financial Resource Strain: Low Risk  (07/19/2024)  Physical Activity: Inactive (05/28/2024)  Social Connections: Moderately Integrated (09/06/2024)  Stress: Stress Concern Present (06/12/2024)  Tobacco Use: High Risk (09/05/2024)     Readmission Risk Interventions    09/07/2024    2:54 PM 08/04/2024    6:17 PM 06/21/2024    1:08 PM  Readmission Risk Prevention Plan  Transportation Screening Complete Complete Complete  PCP or Specialist Appt within 3-5 Days  Complete   HRI or Home Care Consult  Complete Complete  Social Work Consult for Recovery Care Planning/Counseling  Complete Complete  Palliative Care Screening  Not Applicable Not Applicable  Medication Review Oceanographer) Complete Complete Complete  PCP or Specialist appointment within 3-5 days of discharge Complete    HRI or Home Care Consult Complete    SW Recovery Care/Counseling Consult Complete    Palliative Care Screening Not Applicable    Skilled Nursing Facility Not Applicable

## 2024-09-13 NOTE — Discharge Summary (Addendum)
 Physician Discharge Summary   Patient: [name] MRN: [id number] DOB: Jan 15, 1951  Admit date:     09/05/2024  Discharge date: 09/13/24  Discharge Physician: Delon Herald   PCP: Elnora Ip, MD   Recommendations at discharge:   STOP smoking!  You have declined a nicotine  patch.  You are more likely to continue to require hospitalizations as long as you continue to smoke. You are being discharged with home health physical therapy Antibiotics completed Steroid taper is prescribed Budesonide  (pulmicort ) nebulizer treatments ordered for twice daily use Duonebs added - use every 6 hours with Albuterol  as needed every 2 hours for breakthrough symptoms Buspirone  added for anxiety, take twice daily Follow up with Dr. Gomez-Caraballo in 1-2 weeks You are being referred to pulmonology and should be contacted with an appointment You are being referred to cardiology and should be contacted with an appointment  Discharge Diagnoses: Principal Problem:   COPD with acute exacerbation (HCC) Active Problems:   Tobacco use disorder   Dyslipidemia associated with type 2 diabetes mellitus (HCC)   Type 2 diabetes mellitus with complication, without long-term current use of insulin  (HCC)   Essential hypertension   Class 1 obesity due to excess calories with body mass index (BMI) of 30.0 to 30.9 in adult   Chronic diastolic CHF (congestive heart failure) [facility])   Hospital Course: 73yo with h/o COPD with ongoing tobacco use, HLD, and depression who presented on 10/29 with SOB and wheezing. She has frequent COPD exacerbations requiring admission.  No hypoxia. Given nebs and steroids.   Assessment and Plan:  Assessment & Plan COPD with acute exacerbation (HCC) Presented with worsening shortness of breath, wheezing Expiratory wheezing and rhonchi on examination Workup negative for evidence of viral or acute bacterial infection IV Solu-Medrol  -> prednisone   Scheduled DuoNebs and  as needed albuterol  inhaler Incentive spirometer and flutter valve Currently on room air, has been intermittently wearing oxygen  but there are no notes to indicate need for home O2 currently; ambulatory pulse ox ordered CTA chest on 09/10/2024 did not show any pulmonary embolism with no signs of pneumonia but showed emphysema   Given 2 doses of Lasix Echo with preserved EF, grade 1 DD, ?HOCM Essential hypertension Continue home amlodipine , Hyzaar and metoprolol  Type 2 diabetes mellitus with complication, without long-term current use of insulin  (HCC) A1c 5.7, well controlled Diet controlled at home Started on moderate-scale SSI in the setting of steroids Tobacco use disorder Cessation encouraged Nicotine  patch ordered Chronic diastolic CHF (congestive heart failure) (HCC) Echo 11/4 with preserved EF, ?HOCM, grade 1 DD, small pericardial effusion Given Lasix but appears compensated Recommend outpatient cardiology f/u Class 1 obesity due to excess calories with body mass index (BMI) of 30.0 to 30.9 in adult Body mass index is 29.76 kg/m.SABRA  Weight loss should be encouraged Outpatient PCP/bariatric medicine f/u encouraged Significantly low or high BMI is associated with higher medical risk including morbidity and mortality       Consultants: None   Procedures: Echocardiogram 11/4   Antibiotics: Azithromycin  x 3 doses    Pain control - [place]  Controlled Substance Reporting System database was reviewed. and patient was instructed, not to drive, operate heavy machinery, perform activities at heights, swimming or participation in water activities or provide baby-sitting services while on Pain, Sleep and Anxiety Medications; until their outpatient Physician has advised to do so again. Also recommended to not to take more than prescribed Pain, Sleep and Anxiety Medications.    Disposition: Home Diet recommendation:  Cardiac diet DISCHARGE MEDICATION: Allergies as of  09/13/2024       Reactions   Ace Inhibitors Anaphylaxis, Swelling, Other (See Comments)   Throat swelling  (Re: Lisinopril -hydrochlorothiazide ) Currently taking losartan -HCTZ intermittently with no side effects, previous reaction possibly only to the lisinopril  portion   Chantix  [varenicline ] Nausea And Vomiting   Nicoderm [nicotine ] Nausea And Vomiting        Medication List     STOP taking these medications    Anoro Ellipta  62.5-25 MCG/ACT Aepb Generic drug: umeclidinium-vilanterol   Arnuity Ellipta  100 MCG/ACT Aepb Generic drug: Fluticasone  Furoate   doxycycline  100 MG tablet Commonly known as: VIBRA -TABS   ibuprofen  200 MG tablet Commonly known as: ADVIL    pantoprazole  40 MG tablet Commonly known as: PROTONIX        TAKE these medications    albuterol  108 (90 Base) MCG/ACT inhaler Commonly known as: VENTOLIN  HFA Inhale 2 puffs into the lungs every 6 (six) hours as needed for wheezing or shortness of breath.   albuterol  (2.5 MG/3ML) 0.083% nebulizer solution Commonly known as: PROVENTIL  Take 3 mLs (2.5 mg total) by nebulization every 4 (four) hours as needed for wheezing or shortness of breath.   amLODipine  10 MG tablet Commonly known as: NORVASC  Take 1 tablet (10 mg total) by mouth daily.   benzonatate  200 MG capsule Commonly known as: TESSALON  Take 1 capsule (200 mg total) by mouth 2 (two) times daily as needed (cough).   budesonide  0.5 MG/2ML nebulizer solution Commonly known as: PULMICORT  Take 2 mLs (0.5 mg total) by nebulization 2 (two) times daily.   busPIRone  5 MG tablet Commonly known as: BUSPAR  Take 1 tablet (5 mg total) by mouth 2 (two) times daily.   fluticasone  50 MCG/ACT nasal spray Commonly known as: FLONASE  Place 2 sprays into both nostrils daily. What changed:  when to take this reasons to take this   ipratropium-albuterol  0.5-2.5 (3) MG/3ML Soln Commonly known as: DUONEB Take 3 mLs by nebulization every 6 (six) hours.    loratadine  10 MG tablet Commonly known as: CLARITIN  Take 1 tablet (10 mg total) by mouth daily. What changed:  when to take this reasons to take this   losartan -hydrochlorothiazide  50-12.5 MG tablet Commonly known as: HYZAAR Take 1 tablet by mouth 2 (two) times daily. What changed: when to take this   metoprolol  tartrate 50 MG tablet Commonly known as: LOPRESSOR  Take 1 tablet (50 mg total) by mouth 2 (two) times daily. What changed: when to take this   predniSONE  20 MG tablet Commonly known as: DELTASONE  Take 3 tablets once daily for 3 days followed by 2 tablets once daily for 3 days followed by 1 tablet once daily for 3 days and then stop        Follow-up Information     Elnora Ip, MD Follow up in 1 week(s).   Specialty: Internal Medicine Contact information: [address] [place] [address] [phone]                Discharge Exam:   Subjective: Feeling better.  Really wants to go home today.   Objective: Vitals:   09/13/24 0514 09/13/24 0814  BP: (!) 151/70   Pulse: 71   Resp: 18   Temp: 97.9 F (36.6 C)   SpO2: 91% 90%    Intake/Output Summary (Last 24 hours) at 09/13/2024 1255 Last data filed at 09/13/2024 0900 Gross per 24 hour  Intake 597 ml  Output 400 ml  Net 197 ml  Filed Weights   09/09/24 1004 09/12/24 0610 09/13/24 0514  Weight: 78 kg 84.7 kg 83.6 kg    Exam:  General:  Appears calm and comfortable and is in NAD Eyes:  normal lids, iris ENT:  grossly normal hearing, lips & tongue, mmm Cardiovascular:  RRR, no m/r/g. No LE edema.  Respiratory:   CTA bilaterally with no wheezes/rales/rhonchi.  Normal respiratory effort. Abdomen:  soft, NT, ND Skin:  no rash or induration seen on limited exam Musculoskeletal:  grossly normal tone BUE/BLE, good ROM, no bony abnormality Psychiatric:  grossly normal mood and affect, speech fluent and appropriate, AOx3 Neurologic:  CN 2-12 grossly intact, moves all  extremities in coordinated fashion, sensation intact  Data Reviewed: I have reviewed the patient's lab results since admission.  Pertinent labs for today include:   Glucose 149-307    Condition at discharge: improving  The results of significant diagnostics from this hospitalization (including imaging, microbiology, ancillary and laboratory) are listed below for reference.   Imaging Studies: ECHOCARDIOGRAM COMPLETE Result Date: 09/11/2024    ECHOCARDIOGRAM REPORT   Patient Name:   SHUNTAVIA YERBY [facility] Date of Exam: 09/11/2024 Medical Rec #:  [id number]          Height:       66.0 in Accession #:    [id number]         Weight:       172.0 lb Date of Birth:  05/01/1951          BSA:          1.875 m Patient Age:    73 years           BP:           190/76 mmHg Patient Gender: F                  HR:           71 bpm. Exam Location:  Inpatient Procedure: 2D Echo, Color Doppler and Cardiac Doppler (Both Spectral and Color            Flow Doppler were utilized during procedure). Indications:    Dyspnea R06.00  History:        Patient has prior history of Echocardiogram examinations, most                 recent 04/19/2024.  Sonographer:    Tinnie Gosling RDCS Referring Phys: [id number] SOPHIE MAO IMPRESSIONS  1. Left ventricular ejection fraction, by estimation, is 65 to 70%. The left ventricle has normal function. Left ventricular endocardial border not optimally defined to evaluate regional wall motion. There is severe asymmetric left ventricular hypertrophy of the basal-septal segment. Left ventricular diastolic parameters are consistent with Grade I diastolic dysfunction (impaired relaxation).  2. Right ventricular systolic function is normal. The right ventricular size is normal. Mildly increased right ventricular wall thickness.  3. A small pericardial effusion is present. The pericardial effusion is anterior to the right ventricle.  4. The mitral valve is normal in structure. Trivial mitral valve  regurgitation. No evidence of mitral stenosis.  5. The aortic valve was not well visualized. Aortic valve regurgitation is not visualized. No aortic stenosis is present.  6. The inferior vena cava is normal in size with greater than 50% respiratory variability, suggesting right atrial pressure of 3 mmHg. Comparison(s): Prior images reviewed side by side. Slight decrease in LV function (still normal). FINDINGS  Left Ventricle: BSH with maximall thickness 17 mm, with LVOT flow  acceleration but without LVOT resting gradient. Left ventricular ejection fraction, by estimation, is 65 to 70%. The left ventricle has normal function. Left ventricular endocardial border not optimally defined to evaluate regional wall motion. The left ventricular internal cavity size was normal in size. There is severe asymmetric left ventricular hypertrophy of the basal-septal segment. Left ventricular diastolic parameters are consistent with Grade I diastolic dysfunction (impaired relaxation). Right Ventricle: The right ventricular size is normal. Mildly increased right ventricular wall thickness. Right ventricular systolic function is normal. Left Atrium: Left atrial size was normal in size. Right Atrium: Right atrial size was normal in size. Pericardium: A small pericardial effusion is present. The pericardial effusion is anterior to the right ventricle. Mitral Valve: The mitral valve is normal in structure. Trivial mitral valve regurgitation. No evidence of mitral valve stenosis. Tricuspid Valve: The tricuspid valve is normal in structure. Tricuspid valve regurgitation is not demonstrated. No evidence of tricuspid stenosis. Aortic Valve: The aortic valve was not well visualized. Aortic valve regurgitation is not visualized. No aortic stenosis is present. Pulmonic Valve: The pulmonic valve was normal in structure. Pulmonic valve regurgitation is not visualized. No evidence of pulmonic stenosis. Aorta: The aortic root and ascending aorta  are structurally normal, with no evidence of dilitation. Venous: The inferior vena cava is normal in size with greater than 50% respiratory variability, suggesting right atrial pressure of 3 mmHg. IAS/Shunts: No atrial level shunt detected by color flow Doppler.  LEFT VENTRICLE PLAX 2D LVIDd:         4.40 cm   Diastology LVIDs:         2.30 cm   LV e' medial:    4.24 cm/s LV PW:         1.20 cm   LV E/e' medial:  12.9 LV IVS:        1.20 cm   LV e' lateral:   4.13 cm/s LVOT diam:     2.10 cm   LV E/e' lateral: 13.3 LVOT Area:     3.46 cm  RIGHT VENTRICLE            IVC RV S prime:     9.03 cm/s  IVC diam: 1.80 cm LEFT ATRIUM           Index LA diam:      3.10 cm 1.65 cm/m LA Vol (A4C): 30.8 ml 16.42 ml/m   AORTA Ao Root diam: 2.90 cm Ao Asc diam:  2.90 cm MITRAL VALVE MV Area (PHT): 2.78 cm    SHUNTS MV Decel Time: 273 msec    Systemic Diam: 2.10 cm MV E velocity: 54.80 cm/s MV A velocity: 88.30 cm/s MV E/A ratio:  0.62 Stanly Leavens MD Electronically signed by Stanly Leavens MD Signature Date/Time: 09/11/2024/4:28:21 PM    Final    CT Angio Chest Pulmonary Embolism (PE) W or WO Contrast Result Date: 09/10/2024 CLINICAL DATA:  Shortness of breath. EXAM: CT ANGIOGRAPHY CHEST WITH CONTRAST TECHNIQUE: Multidetector CT imaging of the chest was performed using the standard protocol during bolus administration of intravenous contrast. Multiplanar CT image reconstructions and MIPs were obtained to evaluate the vascular anatomy. RADIATION DOSE REDUCTION: This exam was performed according to the departmental dose-optimization program which includes automated exposure control, adjustment of the mA and/or kV according to patient size and/or use of iterative reconstruction technique. CONTRAST:  75mL OMNIPAQUE  IOHEXOL  350 MG/ML SOLN COMPARISON:  11/05/2012. FINDINGS: Cardiovascular: Image quality is degraded by respiratory motion, limiting evaluation of some of the segmental  and subsegmental pulmonary  arteries. Otherwise, no pulmonary embolus. Atherosclerotic calcification of the aorta and coronary arteries. Enlarged pulmonic trunk and heart. Left ventricular hypertrophy. No pericardial effusion. Mediastinum/Nodes: No pathologically enlarged mediastinal, hilar or axillary lymph nodes. Surgical clips in the right axilla. Postoperative changes in the right breast. Esophagus is grossly unremarkable. Lungs/Pleura: Image quality is degraded by expiratory phase imaging and respiratory motion. Centrilobular and paraseptal emphysema. Subpleural radiation scarring in the anterior right lung. Lungs are otherwise clear. No pleural fluid. Airway is unremarkable. Upper Abdomen: Visualized portions of the liver, adrenal glands, kidneys, spleen, pancreas, stomach and bowel are grossly unremarkable. No upper abdominal adenopathy. Musculoskeletal: Degenerative changes in the spine. Review of the MIP images confirms the above findings. IMPRESSION: 1. Image quality is degraded by respiratory motion and expiratory phase imaging, limiting the evaluation of some segmental and subsegmental pulmonary arteries. Otherwise, negative for pulmonary embolus. 2. Aortic atherosclerosis (ICD10-I70.0). Coronary artery calcification. 3.  Emphysema (ICD10-J43.9). 4. Enlarged pulmonic trunk, indicative of pulmonary arterial hypertension. Electronically Signed   By: Newell Eke M.D.   On: 09/10/2024 13:36   DG Chest 2 View Result Date: 09/05/2024 CLINICAL DATA:  Shortness of breath.  Cough. EXAM: CHEST - 2 VIEW COMPARISON:  Radiograph 08/30/2024 FINDINGS: The lungs are hyperinflated with diffuse bronchial thickening. Blunting of the right costophrenic angle likely due to hyperinflation. Stable heart size and mediastinal contours. No focal opacity, pleural effusion or pneumothorax. Right axillary surgical clips. Lower cervical spine hardware. A Bobby pin projecting over the upper chest is external to the patient on the lateral view.  IMPRESSION: Hyperinflation and bronchial thickening, can be seen with COPD exacerbation/acute bronchitis. Electronically Signed   By: Andrea Gasman M.D.   On: 09/05/2024 21:46   DG Chest 2 View Result Date: 08/30/2024 CLINICAL DATA:  SHOB EXAM: CHEST - 2 VIEW COMPARISON:  August 04, 2024 FINDINGS: Emphysema. No focal airspace consolidation, pleural effusion, or pneumothorax. No cardiomegaly. Tortuous aorta with aortic atherosclerosis. No acute fracture or destructive lesions. Multilevel thoracic osteophytosis. Right axillary surgical clips. Cervical fusion hardware. IMPRESSION: Emphysema. No pneumonia or pulmonary edema. Electronically Signed   By: Rogelia Myers M.D.   On: 08/30/2024 08:10    Microbiology: Results for orders placed or performed during the hospital encounter of 09/05/24  Resp panel by RT-PCR (RSV, Flu A&B, Covid) Anterior Nasal Swab     Status: None   Collection Time: 09/05/24 10:25 PM   Specimen: Anterior Nasal Swab  Result Value Ref Range Status   SARS Coronavirus 2 by RT PCR NEGATIVE NEGATIVE Final    Comment: (NOTE) SARS-CoV-2 target nucleic acids are NOT DETECTED.  The SARS-CoV-2 RNA is generally detectable in upper respiratory specimens during the acute phase of infection. The lowest concentration of SARS-CoV-2 viral copies this assay can detect is 138 copies/mL. A negative result does not preclude SARS-Cov-2 infection and should not be used as the sole basis for treatment or other patient management decisions. A negative result may occur with  improper specimen collection/handling, submission of specimen other than nasopharyngeal swab, presence of viral mutation(s) within the areas targeted by this assay, and inadequate number of viral copies(<138 copies/mL). A negative result must be combined with clinical observations, patient history, and epidemiological information. The expected result is Negative.  Fact Sheet for Patients:   bloggercourse.com  Fact Sheet for Healthcare Providers:  seriousbroker.it  This test is no t yet approved or cleared by the [place]  FDA and  has been authorized for detection and/or diagnosis of  SARS-CoV-2 by FDA under an Emergency Use Authorization (EUA). This EUA will remain  in effect (meaning this test can be used) for the duration of the COVID-19 declaration under Section 564(b)(1) of the Act, 21 U.S.C.section 360bbb-3(b)(1), unless the authorization is terminated  or revoked sooner.       Influenza A by PCR NEGATIVE NEGATIVE Final   Influenza B by PCR NEGATIVE NEGATIVE Final    Comment: (NOTE) The Xpert Xpress SARS-CoV-2/FLU/RSV plus assay is intended as an aid in the diagnosis of influenza from Nasopharyngeal swab specimens and should not be used as a sole basis for treatment. Nasal washings and aspirates are unacceptable for Xpert Xpress SARS-CoV-2/FLU/RSV testing.  Fact Sheet for Patients: bloggercourse.com  Fact Sheet for Healthcare Providers: seriousbroker.it  This test is not yet approved or cleared by the [place]  FDA and has been authorized for detection and/or diagnosis of SARS-CoV-2 by FDA under an Emergency Use Authorization (EUA). This EUA will remain in effect (meaning this test can be used) for the duration of the COVID-19 declaration under Section 564(b)(1) of the Act, 21 U.S.C. section 360bbb-3(b)(1), unless the authorization is terminated or revoked.     Resp Syncytial Virus by PCR NEGATIVE NEGATIVE Final    Comment: (NOTE) Fact Sheet for Patients: bloggercourse.com  Fact Sheet for Healthcare Providers: seriousbroker.it  This test is not yet approved or cleared by the [place]  FDA and has been authorized for detection and/or diagnosis of SARS-CoV-2 by FDA under an Emergency Use  Authorization (EUA). This EUA will remain in effect (meaning this test can be used) for the duration of the COVID-19 declaration under Section 564(b)(1) of the Act, 21 U.S.C. section 360bbb-3(b)(1), unless the authorization is terminated or revoked.  Performed at [facility], [address] [place], [place] [address]    *Note: Due to a large number of results and/or encounters for the requested time period, some results have not been displayed. A complete set of results can be found in Results Review.    Labs: CBC: Recent Labs  Lab 09/11/24 0541 09/12/24 0527  WBC 12.8* 15.8*  NEUTROABS 11.1* 14.0*  HGB 12.7 12.8  HCT 42.5 40.8  MCV 88.2 87.6  PLT 384 369   Basic Metabolic Panel: Recent Labs  Lab 09/11/24 0541 09/12/24 0527  NA 136 134*  K 4.0 4.0  CL 88* 88*  CO2 37* 37*  GLUCOSE 394* 252*  BUN 22 21  CREATININE 0.69 0.72  CALCIUM  9.0 8.8*  MG 2.6* 2.5*   Liver Function Tests: Recent Labs  Lab 09/12/24 0527  AST 15  ALT 13  ALKPHOS 70  BILITOT 0.6  PROT 6.2*  ALBUMIN 3.6   CBG: Recent Labs  Lab 09/12/24 1140 09/12/24 1603 09/12/24 2051 09/13/24 0742 09/13/24 1126  GLUCAP 311* 310* 149* 270* 307*    Discharge time spent: greater than 30 minutes.  Signed: Delon Herald, MD [facility] 09/13/2024

## 2024-09-13 NOTE — Progress Notes (Signed)
 Discharge meds in a secure bag delivered to patient in room by this RN. Patient is waiting on a call back from husband. He is her ride home

## 2024-09-13 NOTE — Assessment & Plan Note (Addendum)
 Presented with worsening shortness of breath, wheezing Expiratory wheezing and rhonchi on examination Workup negative for evidence of viral or acute bacterial infection IV Solu-Medrol  -> prednisone   Scheduled DuoNebs and as needed albuterol  inhaler Incentive spirometer and flutter valve Currently on room air, has been intermittently wearing oxygen  but there are no notes to indicate need for home O2 currently; ambulatory pulse ox ordered CTA chest on 09/10/2024 did not show any pulmonary embolism with no signs of pneumonia but showed emphysema   Given 2 doses of Lasix Echo with preserved EF, grade 1 DD, ?HOCM

## 2024-09-13 NOTE — Progress Notes (Signed)
 AVS reviewed with patient who verbalized an understanding. PIV removed as noted. Patient's husband is at work - patient says he can pick her up once  she has everything she needs to go home- waiting on discharge meds doe home. No other questions at this time

## 2024-09-13 NOTE — Assessment & Plan Note (Signed)
 Echo 11/4 with preserved EF, ?HOCM, grade 1 DD, small pericardial effusion Given Lasix but appears compensated Recommend outpatient cardiology f/u

## 2024-09-13 NOTE — Assessment & Plan Note (Signed)
 A1c 5.7, well controlled Diet controlled at home Started on moderate-scale SSI in the setting of steroids

## 2024-09-13 NOTE — Assessment & Plan Note (Addendum)
 Continue home amlodipine , Hyzaar and metoprolol 

## 2024-09-13 NOTE — Progress Notes (Signed)
 Mobility Specialist - Progress Note   Pre-mobility: 91% SpO2 During mobility: 86-88% SpO2 Post-mobility: 91% SPO2    09/13/24 1150  Mobility  Activity Ambulated with assistance  Level of Assistance Contact guard assist, steadying assist  Assistive Device None  Distance Ambulated (ft) 100 ft  Activity Response Tolerated fair  Mobility Referral Yes  Mobility visit 1 Mobility  Mobility Specialist Start Time (ACUTE ONLY) 1130  Mobility Specialist Stop Time (ACUTE ONLY) 1145  Mobility Specialist Time Calculation (min) (ACUTE ONLY) 15 min   Pt was received in bed and agreed to mobility. Desaturation towards the end of session upon returning to bed. After resting edge of bed, SPO2 quickly returned to 91% after a minute. Pt needs were met at the end session, call bell in reach. RN notified.  [employer] - Mobility Specialist

## 2024-09-13 NOTE — Assessment & Plan Note (Addendum)
 Body mass index is 29.76 kg/m.[name]  Weight loss should be encouraged Outpatient PCP/bariatric medicine f/u encouraged Significantly low or high BMI is associated with higher medical risk including morbidity and mortality

## 2024-09-13 NOTE — Assessment & Plan Note (Signed)
 Cessation encouraged  Nicotine patch ordered

## 2024-09-13 NOTE — Plan of Care (Signed)
  Problem: Clinical Measurements: Goal: Ability to maintain clinical measurements within normal limits will improve Outcome: Progressing Goal: Respiratory complications will improve Outcome: Progressing Goal: Cardiovascular complication will be avoided Outcome: Progressing   Problem: Activity: Goal: Risk for activity intolerance will decrease Outcome: Progressing   Problem: Safety: Goal: Ability to remain free from injury will improve Outcome: Progressing   Problem: Respiratory: Goal: Ability to maintain a clear airway will improve Outcome: Progressing Goal: Levels of oxygenation will improve Outcome: Progressing Goal: Ability to maintain adequate ventilation will improve Outcome: Progressing

## 2024-09-14 ENCOUNTER — Telehealth: Payer: Self-pay

## 2024-09-14 NOTE — Transitions of Care (Post Inpatient/ED Visit) (Signed)
 09/14/2024  Name: [name] MRN: [id number] DOB: [birth year]  Today's TOC FU Call Status: Today's TOC FU Call Status:: Successful TOC FU Call Completed TOC FU Call Complete Date: 09/14/24 Patient's Name and Date of Birth confirmed.  Transition Care Management Follow-up Telephone Call Date of Discharge: 09/13/24 Discharge Facility: [facility]) Type of Discharge: Inpatient Admission Primary Inpatient Discharge Diagnosis:: COPD exacerbation How have you been since you were released from the hospital?: Same Any questions or concerns?: No  Items Reviewed: Did you receive and understand the discharge instructions provided?: Yes Medications obtained,verified, and reconciled?: Yes (Medications Reviewed) Any new allergies since your discharge?: No Dietary orders reviewed?: Yes Type of Diet Ordered:: Low Sodium Heart healthy Do you have support at home?: Yes People in Home [RPT]: spouse Name of Support/Comfort Primary Source: [name]  Medications Reviewed Today: Medications Reviewed Today     Reviewed by [name], RN (Case Manager) on 09/14/24 at 1254  Med List Status: <None>   Medication Order Taking? Sig Documenting Provider Last Dose Status Informant  albuterol  (PROVENTIL ) (2.5 MG/3ML) 0.083% nebulizer solution [id number] No Take 3 mLs (2.5 mg total) by nebulization every 4 (four) hours as needed for wheezing or shortness of breath. Gomez-Caraballo, Maria, MD Unknown Active Self, Pharmacy Records  albuterol  (VENTOLIN  HFA) 108 [phone] Base) MCG/ACT inhaler [id number] No Inhale 2 puffs into the lungs every 6 (six) hours as needed for wheezing or shortness of breath. Jolaine Pac, DO Unknown Active Self, Pharmacy Records  amLODipine  (NORVASC ) 10 MG tablet [id number] No Take 1 tablet (10 mg total) by mouth daily. Fernand Prost, MD 09/05/2024 Morning Active Self, Pharmacy Records           Med Note (COFFELL, JON HERO   Fri Aug 31, 2024 11:36 AM) Patient admits  non-compliance and only takes 2-3 times a week.  benzonatate  (TESSALON ) 200 MG capsule [id number]  Take 1 capsule (200 mg total) by mouth 2 (two) times daily as needed (cough). Barbarann Nest, MD  Active   budesonide  (PULMICORT ) 0.5 MG/2ML nebulizer solution [id number]  Take 2 mLs (0.5 mg total) by nebulization 2 (two) times daily. Barbarann Nest, MD  Active   busPIRone  (BUSPAR ) 5 MG tablet [id number]  Take 1 tablet (5 mg total) by mouth 2 (two) times daily. Barbarann Nest, MD  Active   fluticasone  (FLONASE ) 50 MCG/ACT nasal spray [id number] No Place 2 sprays into both nostrils daily.  Patient taking differently: Place 2 sprays into both nostrils daily as needed.   Sebastian Toribio GAILS, MD Unknown Active Self, Pharmacy Records  ipratropium-albuterol  (DUONEB) 0.5-2.5 (3) MG/3ML SOLN [id number]  Take 3 mLs by nebulization every 6 (six) hours. Barbarann Nest, MD  Active   loratadine  (CLARITIN ) 10 MG tablet [id number] No Take 1 tablet (10 mg total) by mouth daily.  Patient taking differently: Take 10 mg by mouth daily as needed for allergies.   Verdene Purchase, MD Unknown Active Self, Pharmacy Records  losartan -hydrochlorothiazide  [facility]) 50-12.5 MG tablet [id number] No Take 1 tablet by mouth 2 (two) times daily.  Patient taking differently: Take 1 tablet by mouth daily.   Elnora Ip, MD 09/05/2024 Morning Active Self, Pharmacy Records           Med Note (COFFELL, JON HERO   Fri Aug 31, 2024 11:36 AM) Patient admits non-compliance and only takes 2-3 times a week.  metoprolol  tartrate (LOPRESSOR ) 50 MG tablet [id number] No Take 1 tablet (50 mg total) by mouth 2 (two) times daily.  Patient taking differently: Take  50 mg by mouth daily.   Vann, Jessica U, DO 09/05/2024 Morning Active Self, Pharmacy Records           Med Note (COFFELL, JON HERO   Fri Aug 31, 2024 11:36 AM) Patient admits non-compliance and only takes 2-3 times a week.  predniSONE  (DELTASONE ) 20 MG tablet [id number]  Take 3 tablets  once daily for 3 days THEN take 2 tablets once daily for 3 days THEN take 1 tablet once daily for 3 days Barbarann Nest, MD  Active             Home Care and Equipment/Supplies: Were Home Health Services Ordered?: Yes Name of Home Health Agency:: [facility] Has Agency set up a time to come to your home?: No EMR reviewed for Home Health Orders: Orders present/patient has not received call (refer to CM for follow-up) Any new equipment or medical supplies ordered?: Yes Name of Medical supply agency?: [employer] Were you able to get the equipment/medical supplies?: Yes Do you have any questions related to the use of the equipment/supplies?: No  Functional Questionnaire: Do you need assistance with bathing/showering or dressing?: No Do you need assistance with meal preparation?: No Do you need assistance with eating?: No Do you have difficulty maintaining continence: No Do you need assistance with getting out of bed/getting out of a chair/moving?: No Do you have difficulty managing or taking your medications?: Yes (Sometimes. Forgetful)  Follow up appointments reviewed: PCP Follow-up appointment confirmed?: Yes Date of PCP follow-up appointment?: 09/20/24 Follow-up Provider: Dr. Elnora Specialist [facility] Follow-up appointment confirmed?: No Reason Specialist Follow-Up Not Confirmed: Patient has Specialist Provider Number and will Call for Appointment  SDOH Interventions Today    Flowsheet Row Most Recent Value  SDOH Interventions   Food Insecurity Interventions Intervention Not Indicated  Housing Interventions Intervention Not Indicated  Transportation Interventions Intervention Not Indicated  Utilities Interventions Intervention Not Indicated  Depression Interventions/Treatment  Counseling, Medication    Goals Addressed             This Visit's Progress    VBCI Transitions of Care (TOC) Care Plan       Problems:  Recent Hospitalization for treatment of  COPD Hospital or ED Adm Risk of 62%  Goal:  Over the next 30 days, the patient will not experience hospital readmission  Interventions:   COPD Interventions: Advised patient to track and manage COPD triggers Assessed social determinant of health barriers Discussed the importance of adequate rest and management of fatigue with COPD Provided education about and advised patient to utilize infection prevention strategies to reduce risk of respiratory infection Provided instruction about proper use of medications used for management of COPD including inhalers Screening for signs and symptoms of depression related to chronic disease state  Follow up with Pulmonology referral  Encourage smoking cessation or cut down to less than 5 cigarettes a day Prednisone  taper Duonebs added with every 6 hours with Albuterol  Budesonide  Nebs twice daily HHPT with [facility]   Patient Self Care Activities:  Attend all scheduled provider appointments Call pharmacy for medication refills 3-7 days in advance of running out of medications Call provider office for new concerns or questions  Notify RN Care Manager of [facility] call rescheduling needs Participate in Transition of Care Program/Attend [facility] scheduled calls Perform all self care activities independently  Take medications as prescribed    Plan:  Telephone follow up appointment with care management team member scheduled for:  Tuesday November 11th at 3:00pm  Medford Balboa, BSN, RN [place]  VBCI - [employer] [facility] RN Care Manager [phone]

## 2024-09-14 NOTE — Patient Instructions (Signed)
 Visit Information  Thank you for taking time to visit with me today. Please don't hesitate to contact me if I can be of assistance to you before our next scheduled telephone appointment.  Our next appointment is by telephone on Tuesday November 11th at 1:00pm  Following is a copy of your care plan:   Goals Addressed             This Visit's Progress    VBCI Transitions of Care (TOC) Care Plan       Problems:  Recent Hospitalization for treatment of COPD Hospital or ED Adm Risk of 62%  Goal:  Over the next 30 days, the patient will not experience hospital readmission  Interventions:   COPD Interventions: Advised patient to track and manage COPD triggers Assessed social determinant of health barriers Discussed the importance of adequate rest and management of fatigue with COPD Provided education about and advised patient to utilize infection prevention strategies to reduce risk of respiratory infection Provided instruction about proper use of medications used for management of COPD including inhalers Screening for signs and symptoms of depression related to chronic disease state  Follow up with Pulmonology referral  Encourage smoking cessation or cut down to less than 5 cigarettes a day Prednisone  taper Duonebs added with every 6 hours with Albuterol  Budesonide  Nebs twice daily HHPT with [facility]   Patient Self Care Activities:  Attend all scheduled provider appointments Call pharmacy for medication refills 3-7 days in advance of running out of medications Call provider office for new concerns or questions  Notify RN Care Manager of [facility] call rescheduling needs Participate in Transition of Care Program/Attend [facility] scheduled calls Perform all self care activities independently  Take medications as prescribed    Plan:  Telephone follow up appointment with care management team member scheduled for:  Tuesday November 11th at 3:00pm        Patient verbalizes  understanding of instructions and care plan provided today and agrees to view in MyChart. Active MyChart status and patient understanding of how to access instructions and care plan via MyChart confirmed with patient.     The patient has been provided with contact information for the care management team and has been advised to call with any health related questions or concerns.   Please call the care guide team at [phone] if you need to cancel or reschedule your appointment.   Please call the Suicide and Crisis Lifeline: 988 call the [place]  National Suicide Prevention Lifeline: (432)155-7278 or TTY: (603)076-0744 TTY (201)298-3528) to talk to a trained counselor if you are experiencing a Mental Health or Behavioral Health Crisis or need someone to talk to.  Medford Balboa, BSN, RN   VBCI - [employer] [facility] RN Care Manager [phone]

## 2024-09-18 ENCOUNTER — Other Ambulatory Visit: Payer: Self-pay

## 2024-09-18 ENCOUNTER — Telehealth: Payer: Self-pay

## 2024-09-18 NOTE — Patient Instructions (Signed)
 Visit Information  Thank you for taking time to visit with me today. Please don't hesitate to contact me if I can be of assistance to you before our next scheduled telephone appointment.  Our next appointment is by telephone on Wednesday November 19th at 2:15pm  Following is a copy of your care plan:   Goals Addressed             This Visit's Progress    VBCI Transitions of Care (TOC) Care Plan       Problems: (reviewed 09/18/24) Recent Hospitalization for treatment of COPD Hospital or ED Adm Risk of 62%  Goal: (reviewed 09/18/24) Over the next 30 days, the patient will not experience hospital readmission  Interventions: (reviewed 09/18/24)  COPD Interventions: Advised patient to track and manage COPD triggers Assessed social determinant of health barriers Discussed the importance of adequate rest and management of fatigue with COPD Provided education about and advised patient to utilize infection prevention strategies to reduce risk of respiratory infection Provided instruction about proper use of medications used for management of COPD including inhalers Screening for signs and symptoms of depression related to chronic disease state  Follow up with Pulmonology referral  Encourage smoking cessation or cut down to less than 5 cigarettes a day Prednisone  taper Duonebs added with every 6 hours with Albuterol  Budesonide  Nebs twice daily HHPT with [facility] - The patient has declined HHPT  Patient Self Care Activities: (reviewed 09/18/24) Attend all scheduled provider appointments Call pharmacy for medication refills 3-7 days in advance of running out of medications Call provider office for new concerns or questions  Notify RN Care Manager of [facility] call rescheduling needs Participate in Transition of Care Program/Attend [facility] scheduled calls Perform all self care activities independently  Take medications as prescribed    Plan:  Telephone follow up appointment with care  management team member scheduled for:  Wednesday November 19th at 2:15pm        Patient verbalizes understanding of instructions and care plan provided today and agrees to view in MyChart. Active MyChart status and patient understanding of how to access instructions and care plan via MyChart confirmed with patient.     The patient has been provided with contact information for the care management team and has been advised to call with any health related questions or concerns.   Please call the care guide team at [phone] if you need to cancel or reschedule your appointment.   Please call the Suicide and Crisis Lifeline: 988 call the [place]  National Suicide Prevention Lifeline: 352-758-6901 or TTY: 270 768 9523 TTY 304-135-7191) to talk to a trained counselor if you are experiencing a Mental Health or Behavioral Health Crisis or need someone to talk to.  Medford Balboa, BSN, RN [place]  VBCI - [employer] [facility] RN Care Manager [phone]

## 2024-09-18 NOTE — Transitions of Care (Post Inpatient/ED Visit) (Signed)
 Transition of Care week 2  Visit Note  09/18/2024  Name: [name] MRN: [id number]          DOB: 01/18/1951  Situation: Patient enrolled in [facility] 30-day program. Visit completed with Barnie Lady by telephone.   Background:   Initial Transition Care Management Follow-up Telephone Call Discharge Date and Diagnosis: 09/13/24, COPD exacerbation   Past Medical History:  Diagnosis Date   Acute sinusitis 01/09/2019   Acute sinusitis 01/09/2019   Anxiety    Arthritis    fingers, knees (08/16/2018)   Asthma    Atopic dermatitis 03/20/2018   Axillary hidradenitis suppurativa 10/13/2018   Cancer of right breast (HCC) 1991   s/p lumpectomy, chemotherapy and radiation therapy in 1991. Mammogram in 2007 was normal.   Cellulitis of buttock, left 12/13/2022   Constipated    h/o   COPD (chronic obstructive pulmonary disease) (HCC)    History of multiple hospital admissions for exercabation    COPD exacerbation (HCC) 01/01/2019   COPD exacerbation (HCC) 02/17/2024   COPD with acute exacerbation (HCC) 01/14/2019   COPD with exacerbation (HCC) 04/06/2009   Qualifier: Diagnosis of  By: Loletta MD, Vijay     Depression    Diarrhea    h/o   Facial cellulitis 08/30/2022   Facial edema 08/31/2022   GERD (gastroesophageal reflux disease)    Grade I diastolic dysfunction 01/12/2024   Headache    a few times/month (08/16/2018   Heart murmur 10/05/2011   first time I ever heard I had one was today   History of breast cancer 12/01/2012   Pt with h/o breast CA s/p lumpectomy with chemo/radiation in 1991. Pt mammogram 2011 was unremarkable. Had CT chest 10/2012 in ED for SOB and showed spiculated nodule with lymph node. 12/04/12: Birads 2; repeat diagnostic mammogram in 1 year.     Hyperlipidemia    Hypertension    Lower extremity edema 09/21/2018   Obesity    Personal history of chemotherapy    Personal history of radiation therapy    Pneumonia    couple times in the last  10-15 yrs (08/16/2018)   QT prolongation 08/08/2014   Seasonal allergies 02/25/2017   Shortness of breath 10/05/2011   at rest; lying down; w/exertion   Sigmoid diverticulitis 80/2008   Tobacco abuse    Type 2 diabetes mellitus (HCC) 05/14/2009   Type II diabetes mellitus (HCC)     Assessment: Patient Reported Symptoms: Cognitive Cognitive Status: Alert and oriented to person, place, and time, Normal speech and language skills, Struggling with memory recall      Neurological      HEENT HEENT Symptoms Reported: Nasal discharge, Other: (Hoarseness) HEENT Management Strategies: Adequate rest, Medication therapy HEENT Comment: The patient is complaining of allergies    Cardiovascular Cardiovascular Symptoms Reported: No symptoms reported Cardiovascular Management Strategies: Medication therapy, Routine screening, Coping strategies  Respiratory Respiratory Symptoms Reported: Productive cough, Shortness of breath Other Respiratory Symptoms: Fatigue Additional Respiratory Details: She states she has some head congestion but she is taking all her meds. She continues on a steroid taper. She states her two daughters who are nurses have been coming over to help her. She has smoked a few cigarettes today but state she doesn't have the tast for them and is trying to quit Respiratory Management Strategies: Routine screening, Medication therapy, Adequate rest  Endocrine Endocrine Symptoms Reported: No symptoms reported Is patient diabetic?: No    Gastrointestinal Gastrointestinal Symptoms Reported: No symptoms reported  Genitourinary Genitourinary Symptoms Reported: No symptoms reported    Integumentary Integumentary Symptoms Reported: No symptoms reported    Musculoskeletal Musculoskelatal Symptoms Reviewed: Weakness Musculoskeletal Management Strategies: Routine screening, Coping strategies Musculoskeletal Comment: The patient declines the use of her walker. HHPT has not shown up and  she does not want CM to follow up. She states she doesn't want it      Psychosocial Psychosocial Symptoms Reported: No symptoms reported         There were no vitals filed for this visit.    Medications Reviewed Today     Reviewed by [name], RN (Case Manager) on 09/18/24 at 1507  Med List Status: <None>   Medication Order Taking? Sig Documenting Provider Last Dose Status Informant  albuterol  (PROVENTIL ) (2.5 MG/3ML) 0.083% nebulizer solution [id number] No Take 3 mLs (2.5 mg total) by nebulization every 4 (four) hours as needed for wheezing or shortness of breath. Gomez-Caraballo, Maria, MD Unknown Active Self, Pharmacy Records  albuterol  (VENTOLIN  HFA) 108 [phone] Base) MCG/ACT inhaler [id number] No Inhale 2 puffs into the lungs every 6 (six) hours as needed for wheezing or shortness of breath. Jolaine Pac, DO Unknown Active Self, Pharmacy Records  amLODipine  (NORVASC ) 10 MG tablet [id number] No Take 1 tablet (10 mg total) by mouth daily. Fernand Prost, MD 09/05/2024 Morning Active Self, Pharmacy Records           Med Note (COFFELL, JON HERO   Fri Aug 31, 2024 11:36 AM) Patient admits non-compliance and only takes 2-3 times a week.  benzonatate  (TESSALON ) 200 MG capsule [id number]  Take 1 capsule (200 mg total) by mouth 2 (two) times daily as needed (cough). Barbarann Nest, MD  Active   budesonide  (PULMICORT ) 0.5 MG/2ML nebulizer solution [id number]  Take 2 mLs (0.5 mg total) by nebulization 2 (two) times daily. Barbarann Nest, MD  Active   busPIRone  (BUSPAR ) 5 MG tablet [id number]  Take 1 tablet (5 mg total) by mouth 2 (two) times daily. Barbarann Nest, MD  Active   fluticasone  (FLONASE ) 50 MCG/ACT nasal spray [id number] No Place 2 sprays into both nostrils daily.  Patient taking differently: Place 2 sprays into both nostrils daily as needed.   Sebastian Toribio GAILS, MD Unknown Active Self, Pharmacy Records  ipratropium-albuterol  (DUONEB) 0.5-2.5 (3) MG/3ML SOLN [id number]  Take 3 mLs  by nebulization every 6 (six) hours. Barbarann Nest, MD  Active   loratadine  (CLARITIN ) 10 MG tablet [id number] No Take 1 tablet (10 mg total) by mouth daily.  Patient taking differently: Take 10 mg by mouth daily as needed for allergies.   Verdene Purchase, MD Unknown Active Self, Pharmacy Records  losartan -hydrochlorothiazide  [facility]) 50-12.5 MG tablet [id number] No Take 1 tablet by mouth 2 (two) times daily.  Patient taking differently: Take 1 tablet by mouth daily.   Elnora Ip, MD 09/05/2024 Morning Active Self, Pharmacy Records           Med Note (COFFELL, JON HERO   Fri Aug 31, 2024 11:36 AM) Patient admits non-compliance and only takes 2-3 times a week.  metoprolol  tartrate (LOPRESSOR ) 50 MG tablet [id number] No Take 1 tablet (50 mg total) by mouth 2 (two) times daily.  Patient taking differently: Take 50 mg by mouth daily.   Vann, Jessica U, DO 09/05/2024 Morning Active Self, Pharmacy Records           Med Note (COFFELL, JON HERO   Fri Aug 31, 2024 11:36 AM) Patient admits non-compliance and only takes 2-3 times a week.  predniSONE  (  DELTASONE ) 20 MG tablet [id number]  Take 3 tablets once daily for 3 days THEN take 2 tablets once daily for 3 days THEN take 1 tablet once daily for 3 days Barbarann Nest, MD  Active             Recommendation:   Continue Current Plan of Care  Follow Up Plan:   Telephone follow-up in 1 week  Medford Balboa, BSN, RN [place]  VBCI - [facility] RN Care Manager [phone]

## 2024-09-26 ENCOUNTER — Ambulatory Visit

## 2024-09-26 ENCOUNTER — Other Ambulatory Visit: Payer: Self-pay

## 2024-09-26 NOTE — Progress Notes (Deleted)
 Patient name: [name] Date of birth: 1951-05-14 Date of visit: 09/26/24  Type of visit: Established Patient Office Visit   Subjective   Chief concern: No chief complaint on file.   [name] is a 73 y.o. female with a history of COPD, diastolic CHF, DMII, diabetic neuropathy, HTN, who presents to [facility] clinic {imcrsn:33113}   ***ROS: Denies headaches, dizziness, fever, chills, runny nose, sore throat, vision changes, hearing changes, chest pain, shortness of breath, difficulty breathing, nausea, vomiting, abdominal pain. Denies increased urinary frequency, pain with urination, constipation or diarrhea. No recent falls.   Patient Active Problem List   Diagnosis Date Noted   Chronic diastolic CHF (congestive heart failure) (HCC) 09/12/2024   COPD exacerbation (HCC) 09/06/2024   Class 1 obesity due to excess calories with body mass index (BMI) of 30.0 to 30.9 in adult 08/05/2024   Essential hypertension 05/25/2024   COPD with acute exacerbation (HCC) 05/14/2024   Hypertensive urgency 04/19/2024   Memory changes 03/28/2023   Age-related osteoporosis without current pathological fracture  03/06/2021   Healthcare maintenance 03/06/2021   Chronic hip pain, right 06/22/2019   Diabetic neuropathy (HCC) 05/21/2019   Type 2 diabetes mellitus with complication, without long-term current use of insulin  (HCC) 12/26/2018   Chronic sinusitis    COPD (chronic obstructive pulmonary disease) (HCC) 12/24/2014   Dyslipidemia associated with type 2 diabetes mellitus (HCC) 06/14/2014   Breast nodule 12/01/2012   Primary osteoarthritis of right knee 09/29/2012   Anxiety 12/30/2009   Tobacco use disorder 04/06/2009   Hypertension associated with chronic kidney disease due to type 2 diabetes mellitus (HCC) 04/06/2009     Past Surgical History:  Procedure Laterality Date   ABDOMINAL HYSTERECTOMY     ANTERIOR CERVICAL DECOMP/DISCECTOMY FUSION  2012   Dr. Beuford  put plate in;  did something to my vertebrae   BACK SURGERY     BREAST LUMPECTOMY Right 1991   DOBUTAMINE  STRESS ECHO  08/2004   Inferior ischemia, normal LV systolic function, no significant CAD   MINOR IRRIGATION AND DEBRIDEMENT OF WOUND Right 09/04/2022   Procedure: IRRIGATION AND DEBRIDEMENT OF NASAL WOUND;  Surgeon: Luciano Standing, MD;  Location: [facility] OR;  Service: ENT;  Laterality: Right;     Current Outpatient Medications  Medication Instructions   albuterol  (PROVENTIL ) 2.5 mg, Nebulization, Every 4 hours PRN   albuterol  (VENTOLIN  HFA) 108 (90 Base) MCG/ACT inhaler 2 puffs, Inhalation, Every 6 hours PRN   amLODipine  (NORVASC ) 10 mg, Oral, Daily   benzonatate  (TESSALON ) 200 mg, Oral, 2 times daily PRN   budesonide  (PULMICORT ) 0.5 mg, Nebulization, 2 times daily   busPIRone  (BUSPAR ) 5 mg, Oral, 2 times daily   fluticasone  (FLONASE ) 50 MCG/ACT nasal spray 2 sprays, Each Nare, Daily   ipratropium-albuterol  (DUONEB) 0.5-2.5 (3) MG/3ML SOLN 3 mLs, Nebulization, Every 6 hours   loratadine  (CLARITIN ) 10 mg, Oral, Daily   losartan -hydrochlorothiazide  (HYZAAR) 50-12.5 MG tablet 1 tablet, Oral, 2 times daily   metoprolol  tartrate (LOPRESSOR ) 50 mg, Oral, 2 times daily   predniSONE  (DELTASONE ) 20 MG tablet Take 3 tablets once daily for 3 days THEN take 2 tablets once daily for 3 days THEN take 1 tablet once daily for 3 days    Social History   Tobacco Use   Smoking status: Every Day    Current packs/day: 0.50    Average packs/day: 0.5 packs/day for 45.0 years (22.5 ttl pk-yrs)    Types: Cigarettes   Smokeless tobacco: Never   Tobacco comments:  1/2 pk per day  Vaping Use   Vaping status: Never Used  Substance Use Topics   Alcohol  use: Yes    Alcohol /week: 4.0 standard drinks of alcohol     Types: 4 Cans of beer per week    Comment: 08/16/2018 weekends only   Drug use: No      Objective  There were no vitals filed for this visit.There is no height or weight on file to calculate BMI.    Physical Exam:   Constitutional: well-appearing *** sitting in exam chair, in no acute distress. Ambulates without use of assistance device *** HEENT: normocephalic atraumatic, mucous membranes moist Eyes: conjunctiva non-erythematous Neck: supple Cardiovascular: regular rate and rhythm, bilateral radial pulses 2+, bilateral dorsal pedal pulses 2+, brisk capillary refill bilateral feet and hands  Pulmonary/Chest: normal work of breathing on room air, lungs clear to auscultation bilaterally Abdominal: soft, non-tender, non-distended MSK: normal bulk and tone. Neurological: alert & oriented x 3, sensation intact bilateral feet to monofilament*** Skin: warm and dry, no ulcers or lesions on bilateral feet*** Psych: mood calm, behavior normal, thought content normal, judgement normal    {Labs (Optional):23779}  The 10-year ASCVD risk score (Arnett DK, et al., 2019) is: 56.4%   Values used to calculate the score:     Age: 24 years     Clincally relevant sex: Female     Is Non-Hispanic African American: Yes     Diabetic: Yes     Tobacco smoker: Yes     Systolic Blood Pressure: 141 mmHg     Is BP treated: Yes     HDL Cholesterol: 80 mg/dL     Total Cholesterol: 176 mg/dL      Assessment & Plan  Problem List Items Addressed This Visit   None   No follow-ups on file.  Patient discussed with Dr. {imcattendings:33109}, who also saw and evaluated the patient.  Doyal Miyamoto, MD [facility]  PGY-1 09/26/2024, 8:06 AM

## 2024-09-26 NOTE — Patient Instructions (Signed)
 Visit Information  Thank you for taking time to visit with me today. Please don't hesitate to contact me if I can be of assistance to you before our next scheduled telephone appointment.  Our next appointment is by telephone on Tuesday November 25th at 3:00pm  Following is a copy of your care plan:   Goals Addressed             This Visit's Progress    VBCI Transitions of Care (TOC) Care Plan       Problems: (reviewed 09/26/24) Recent Hospitalization for treatment of COPD Hospital or ED Adm Risk of 62%  Goal: (reviewed 09/26/24) Over the next 30 days, the patient will not experience hospital readmission  Interventions: (reviewed 09/26/24)  COPD Interventions: Advised patient to track and manage COPD triggers Assessed social determinant of health barriers Discussed the importance of adequate rest and management of fatigue with COPD Provided education about and advised patient to utilize infection prevention strategies to reduce risk of respiratory infection Provided instruction about proper use of medications used for management of COPD including inhalers Screening for signs and symptoms of depression related to chronic disease state  Follow up with Pulmonology referral  Encourage smoking cessation or cut down to less than 5 cigarettes a day Prednisone  taper Duonebs added with every 6 hours with Albuterol  Budesonide  Nebs twice daily HHPT with [facility] - 09/26/24 - The patient has declined HHPT  Patient Self Care Activities: (reviewed 09/26/24) Attend all scheduled provider appointments Call pharmacy for medication refills 3-7 days in advance of running out of medications Call provider office for new concerns or questions  Notify RN Care Manager of [facility] call rescheduling needs Participate in Transition of Care Program/Attend [facility] scheduled calls Perform all self care activities independently  Take medications as prescribed    Plan:  Telephone follow up appointment with  care management team member scheduled for:  Tuesday November 25th at 3:00pm        Patient verbalizes understanding of instructions and care plan provided today and agrees to view in MyChart. Active MyChart status and patient understanding of how to access instructions and care plan via MyChart confirmed with patient.     The patient has been provided with contact information for the care management team and has been advised to call with any health related questions or concerns.   Please call the care guide team at [phone] if you need to cancel or reschedule your appointment.   Please call the Suicide and Crisis Lifeline: 988 call the [place]  National Suicide Prevention Lifeline: (249)612-4544 or TTY: (541) 479-2645 TTY (934)123-2215) to talk to a trained counselor if you are experiencing a Mental Health or Behavioral Health Crisis or need someone to talk to.  Medford Balboa, BSN, RN [place]  VBCI - [employer] [facility] RN Care Manager [phone]

## 2024-09-26 NOTE — Transitions of Care (Post Inpatient/ED Visit) (Signed)
 Transition of Care week 3  Visit Note  09/26/2024  Name: [name] MRN: [id number]          DOB: 01-20-1951  Situation: Patient enrolled in [facility] 30-day program. Visit completed with Barnie Lady by telephone.   Background:   Initial Transition Care Management Follow-up Telephone Call Discharge Date and Diagnosis: 09/13/24, COPD exacerbation   Past Medical History:  Diagnosis Date   Acute sinusitis 01/09/2019   Acute sinusitis 01/09/2019   Anxiety    Arthritis    fingers, knees (08/16/2018)   Asthma    Atopic dermatitis 03/20/2018   Axillary hidradenitis suppurativa 10/13/2018   Cancer of right breast (HCC) 1991   s/p lumpectomy, chemotherapy and radiation therapy in 1991. Mammogram in 2007 was normal.   Cellulitis of buttock, left 12/13/2022   Constipated    h/o   COPD (chronic obstructive pulmonary disease) (HCC)    History of multiple hospital admissions for exercabation    COPD exacerbation (HCC) 01/01/2019   COPD exacerbation (HCC) 02/17/2024   COPD with acute exacerbation (HCC) 01/14/2019   COPD with exacerbation (HCC) 04/06/2009   Qualifier: Diagnosis of  By: Loletta MD, Vijay     Depression    Diarrhea    h/o   Facial cellulitis 08/30/2022   Facial edema 08/31/2022   GERD (gastroesophageal reflux disease)    Grade I diastolic dysfunction 01/12/2024   Headache    a few times/month (08/16/2018   Heart murmur 10/05/2011   first time I ever heard I had one was today   History of breast cancer 12/01/2012   Pt with h/o breast CA s/p lumpectomy with chemo/radiation in 1991. Pt mammogram 2011 was unremarkable. Had CT chest 10/2012 in ED for SOB and showed spiculated nodule with lymph node. 12/04/12: Birads 2; repeat diagnostic mammogram in 1 year.     Hyperlipidemia    Hypertension    Lower extremity edema 09/21/2018   Obesity    Personal history of chemotherapy    Personal history of radiation therapy    Pneumonia    couple times in the last  10-15 yrs (08/16/2018)   QT prolongation 08/08/2014   Seasonal allergies 02/25/2017   Shortness of breath 10/05/2011   at rest; lying down; w/exertion   Sigmoid diverticulitis 80/2008   Tobacco abuse    Type 2 diabetes mellitus (HCC) 05/14/2009   Type II diabetes mellitus (HCC)     Assessment: Patient Reported Symptoms: Cognitive Cognitive Status: Alert and oriented to person, place, and time, Normal speech and language skills, Struggling with memory recall      Neurological Neurological Review of Symptoms: No symptoms reported    HEENT HEENT Symptoms Reported: Not assessed      Cardiovascular Cardiovascular Symptoms Reported: Not assessed    Respiratory Respiratory Symptoms Reported: Productive cough Other Respiratory Symptoms: Fatigue Additional Respiratory Details: The patient sounds better today. Not as much head congestion. She states she is sleepy. She is taking all her medications and doing her inhaler. She states she has not had many cigarettes because she doesn't have the tast for them. Respiratory Management Strategies: Routine screening, Medication therapy, Adequate rest  Endocrine Endocrine Symptoms Reported: Not assessed    Gastrointestinal Gastrointestinal Symptoms Reported: No symptoms reported      Genitourinary Genitourinary Symptoms Reported: No symptoms reported    Integumentary Integumentary Symptoms Reported: Not assessed    Musculoskeletal Musculoskelatal Symptoms Reviewed: Other Other Musculoskeletal Symptoms: Fatigue Musculoskeletal Management Strategies: Routine screening, Coping strategies Musculoskeletal Comment: The patient  does not want HHPT. She states she doesn't need it      Psychosocial Psychosocial Symptoms Reported: Not assessed         There were no vitals filed for this visit.    Medications Reviewed Today     Reviewed by [name], RN (Case Manager) on 09/26/24 at 1451  Med List Status: <None>   Medication Order  Taking? Sig Documenting Provider Last Dose Status Informant  albuterol  (PROVENTIL ) (2.5 MG/3ML) 0.083% nebulizer solution [id number] No Take 3 mLs (2.5 mg total) by nebulization every 4 (four) hours as needed for wheezing or shortness of breath. Gomez-Caraballo, Maria, MD Unknown Active Self, Pharmacy Records  albuterol  (VENTOLIN  HFA) 108 [phone] Base) MCG/ACT inhaler [id number] No Inhale 2 puffs into the lungs every 6 (six) hours as needed for wheezing or shortness of breath. Jolaine Pac, DO Unknown Active Self, Pharmacy Records  amLODipine  (NORVASC ) 10 MG tablet [id number] No Take 1 tablet (10 mg total) by mouth daily. Fernand Prost, MD 09/05/2024 Morning Active Self, Pharmacy Records           Med Note (COFFELL, JON HERO   Fri Aug 31, 2024 11:36 AM) Patient admits non-compliance and only takes 2-3 times a week.  benzonatate  (TESSALON ) 200 MG capsule [id number]  Take 1 capsule (200 mg total) by mouth 2 (two) times daily as needed (cough). Barbarann Nest, MD  Active   budesonide  (PULMICORT ) 0.5 MG/2ML nebulizer solution [id number]  Take 2 mLs (0.5 mg total) by nebulization 2 (two) times daily. Barbarann Nest, MD  Active   busPIRone  (BUSPAR ) 5 MG tablet [id number]  Take 1 tablet (5 mg total) by mouth 2 (two) times daily. Barbarann Nest, MD  Active   fluticasone  (FLONASE ) 50 MCG/ACT nasal spray [id number] No Place 2 sprays into both nostrils daily.  Patient taking differently: Place 2 sprays into both nostrils daily as needed.   Sebastian Toribio GAILS, MD Unknown Active Self, Pharmacy Records  ipratropium-albuterol  (DUONEB) 0.5-2.5 (3) MG/3ML SOLN [id number]  Take 3 mLs by nebulization every 6 (six) hours. Barbarann Nest, MD  Active   loratadine  (CLARITIN ) 10 MG tablet [id number] No Take 1 tablet (10 mg total) by mouth daily.  Patient taking differently: Take 10 mg by mouth daily as needed for allergies.   Verdene Purchase, MD Unknown Active Self, Pharmacy Records  losartan -hydrochlorothiazide  [facility]) 50-12.5  MG tablet [id number] No Take 1 tablet by mouth 2 (two) times daily.  Patient taking differently: Take 1 tablet by mouth daily.   Elnora Ip, MD 09/05/2024 Morning Active Self, Pharmacy Records           Med Note (COFFELL, JON HERO   Fri Aug 31, 2024 11:36 AM) Patient admits non-compliance and only takes 2-3 times a week.  metoprolol  tartrate (LOPRESSOR ) 50 MG tablet [id number] No Take 1 tablet (50 mg total) by mouth 2 (two) times daily.  Patient taking differently: Take 50 mg by mouth daily.   Vann, Jessica U, DO 09/05/2024 Morning Active Self, Pharmacy Records           Med Note (COFFELL, JON HERO   Fri Aug 31, 2024 11:36 AM) Patient admits non-compliance and only takes 2-3 times a week.  predniSONE  (DELTASONE ) 20 MG tablet [id number]  Take 3 tablets once daily for 3 days THEN take 2 tablets once daily for 3 days THEN take 1 tablet once daily for 3 days Barbarann Nest, MD  Active             Recommendation:   Continue  Current Plan of Care Follow up and schedule PCP appointment  Follow Up Plan:   Telephone follow-up in 1 week  Medford Balboa, BSN, RN [place]  VBCI - [facility] RN Care Manager [phone]

## 2024-10-02 ENCOUNTER — Other Ambulatory Visit: Payer: Self-pay

## 2024-10-02 NOTE — Patient Instructions (Signed)
 Visit Information  Thank you for taking time to visit with me today. Please don't hesitate to contact me if I can be of assistance to you before our next scheduled telephone appointment.  Our next appointment is by telephone on Wednesday December 3rd at 3:00pm  Following is a copy of your care plan:   Goals Addressed             This Visit's Progress    VBCI Transitions of Care (TOC) Care Plan       Problems: (reviewed 10/02/24) Recent Hospitalization for treatment of COPD Hospital or ED Adm Risk of 62%  Goal: (reviewed 10/02/24) Over the next 30 days, the patient will not experience hospital readmission  Interventions: (reviewed 10/02/24)  COPD Interventions: Advised patient to track and manage COPD triggers Assessed social determinant of health barriers Discussed the importance of adequate rest and management of fatigue with COPD Provided education about and advised patient to utilize infection prevention strategies to reduce risk of respiratory infection Provided instruction about proper use of medications used for management of COPD including inhalers Screening for signs and symptoms of depression related to chronic disease state  Follow up with Pulmonology referral  Encourage smoking cessation or cut down to less than 5 cigarettes a day Prednisone  taper - Completed Duonebs added with every 6 hours with Albuterol  Budesonide  Nebs twice daily HHPT with [facility] - 09/26/24 - The patient has declined HHPT  Patient Self Care Activities: (reviewed 10/02/24) Attend all scheduled provider appointments Call pharmacy for medication refills 3-7 days in advance of running out of medications Call provider office for new concerns or questions  Notify RN Care Manager of [facility] call rescheduling needs Participate in Transition of Care Program/Attend [facility] scheduled calls Perform all self care activities independently  Take medications as prescribed    Plan:  Telephone follow up  appointment with care management team member scheduled for:  Wednesday December 3rd at 3:00pm        Patient verbalizes understanding of instructions and care plan provided today and agrees to view in MyChart. Active MyChart status and patient understanding of how to access instructions and care plan via MyChart confirmed with patient.     The patient has been provided with contact information for the care management team and has been advised to call with any health related questions or concerns.   Please call the care guide team at [phone] if you need to cancel or reschedule your appointment.   Please call the Suicide and Crisis Lifeline: 988 call the [place]  National Suicide Prevention Lifeline: 740-482-7776 or TTY: 671-537-4987 TTY (602)125-8171) to talk to a trained counselor if you are experiencing a Mental Health or Behavioral Health Crisis or need someone to talk to.  Medford Balboa, BSN, RN [place]  VBCI - [employer] [facility] RN Care Manager [phone]

## 2024-10-02 NOTE — Transitions of Care (Post Inpatient/ED Visit) (Signed)
 Transition of Care week 4  Visit Note  10/02/2024  Name: [name] MRN: [id number]          DOB: 06/07/1951  Situation: Patient enrolled in [facility] 30-day program. Visit completed with Barnie Mustache by telephone.   Background:   Initial Transition Care Management Follow-up Telephone Call Discharge Date and Diagnosis: 09/13/24, COPD exacerbation   Past Medical History:  Diagnosis Date   Acute sinusitis 01/09/2019   Acute sinusitis 01/09/2019   Anxiety    Arthritis    fingers, knees (08/16/2018)   Asthma    Atopic dermatitis 03/20/2018   Axillary hidradenitis suppurativa 10/13/2018   Cancer of right breast (HCC) 1991   s/p lumpectomy, chemotherapy and radiation therapy in 1991. Mammogram in 2007 was normal.   Cellulitis of buttock, left 12/13/2022   Constipated    h/o   COPD (chronic obstructive pulmonary disease) (HCC)    History of multiple hospital admissions for exercabation    COPD exacerbation (HCC) 01/01/2019   COPD exacerbation (HCC) 02/17/2024   COPD with acute exacerbation (HCC) 01/14/2019   COPD with exacerbation (HCC) 04/06/2009   Qualifier: Diagnosis of  By: Loletta MD, Vijay     Depression    Diarrhea    h/o   Facial cellulitis 08/30/2022   Facial edema 08/31/2022   GERD (gastroesophageal reflux disease)    Grade I diastolic dysfunction 01/12/2024   Headache    a few times/month (08/16/2018   Heart murmur 10/05/2011   first time I ever heard I had one was today   History of breast cancer 12/01/2012   Pt with h/o breast CA s/p lumpectomy with chemo/radiation in 1991. Pt mammogram 2011 was unremarkable. Had CT chest 10/2012 in ED for SOB and showed spiculated nodule with lymph node. 12/04/12: Birads 2; repeat diagnostic mammogram in 1 year.     Hyperlipidemia    Hypertension    Lower extremity edema 09/21/2018   Obesity    Personal history of chemotherapy    Personal history of radiation therapy    Pneumonia    couple times in the last  10-15 yrs (08/16/2018)   QT prolongation 08/08/2014   Seasonal allergies 02/25/2017   Shortness of breath 10/05/2011   at rest; lying down; w/exertion   Sigmoid diverticulitis 80/2008   Tobacco abuse    Type 2 diabetes mellitus (HCC) 05/14/2009   Type II diabetes mellitus (HCC)     Assessment: Patient Reported Symptoms: Cognitive Cognitive Status: Alert and oriented to person, place, and time, Normal speech and language skills      Neurological Neurological Review of Symptoms: No symptoms reported    HEENT HEENT Symptoms Reported: No symptoms reported      Cardiovascular Cardiovascular Symptoms Reported: No symptoms reported Does patient have uncontrolled Hypertension?: Yes Is patient checking Blood Pressure at home?: No Cardiovascular Management Strategies: Medication therapy, Routine screening, Coping strategies  Respiratory Respiratory Symptoms Reported: Productive cough Additional Respiratory Details: The patient did not talk long today. She has company in town for the holidays and she is leaving to go out of town. She states she has packed her medications and her nebulizer machine. Respiratory Management Strategies: Routine screening, Medication therapy, Adequate rest  Endocrine Endocrine Symptoms Reported: No symptoms reported Is patient diabetic?: No    Gastrointestinal Gastrointestinal Symptoms Reported: Not assessed      Genitourinary Genitourinary Symptoms Reported: Not assessed    Integumentary Integumentary Symptoms Reported: Not assessed    Musculoskeletal Musculoskelatal Symptoms Reviewed: No symptoms reported  Psychosocial Psychosocial Symptoms Reported: No symptoms reported         There were no vitals filed for this visit.    Medications Reviewed Today     Reviewed by [name], RN (Case Manager) on 10/02/24 at 1521  Med List Status: <None>   Medication Order Taking? Sig Documenting Provider Last Dose Status Informant  albuterol   (PROVENTIL ) (2.5 MG/3ML) 0.083% nebulizer solution [id number]  Take 3 mLs (2.5 mg total) by nebulization every 4 (four) hours as needed for wheezing or shortness of breath. Gomez-Caraballo, Maria, MD  Active Self, Pharmacy Records  albuterol  (VENTOLIN  HFA) [phone] Base) MCG/ACT inhaler [id number]  Inhale 2 puffs into the lungs every 6 (six) hours as needed for wheezing or shortness of breath. Jolaine Pac, DO  Active Self, Pharmacy Records  amLODipine  (NORVASC ) 10 MG tablet [id number]  Take 1 tablet (10 mg total) by mouth daily. Fernand Prost, MD  Active Self, Pharmacy Records           Med Note (COFFELL, JON HERO   Fri Aug 31, 2024 11:36 AM) Patient admits non-compliance and only takes 2-3 times a week.  benzonatate  (TESSALON ) 200 MG capsule [id number]  Take 1 capsule (200 mg total) by mouth 2 (two) times daily as needed (cough). Barbarann Nest, MD  Active   budesonide  (PULMICORT ) 0.5 MG/2ML nebulizer solution [id number]  Take 2 mLs (0.5 mg total) by nebulization 2 (two) times daily. Barbarann Nest, MD  Active   busPIRone  (BUSPAR ) 5 MG tablet [id number]  Take 1 tablet (5 mg total) by mouth 2 (two) times daily. Barbarann Nest, MD  Active   fluticasone  (FLONASE ) 50 MCG/ACT nasal spray [id number]  Place 2 sprays into both nostrils daily.  Patient taking differently: Place 2 sprays into both nostrils daily as needed.   Sebastian Toribio GAILS, MD  Active Self, Pharmacy Records  ipratropium-albuterol  (DUONEB) 0.5-2.5 (3) MG/3ML SOLN [id number]  Take 3 mLs by nebulization every 6 (six) hours. Barbarann Nest, MD  Active   loratadine  (CLARITIN ) 10 MG tablet [id number]  Take 1 tablet (10 mg total) by mouth daily.  Patient taking differently: Take 10 mg by mouth daily as needed for allergies.   Verdene Purchase, MD  Active Self, Pharmacy Records  losartan -hydrochlorothiazide  [facility]) 50-12.5 MG tablet [id number]  Take 1 tablet by mouth 2 (two) times daily.  Patient taking differently: Take 1 tablet by mouth daily.    Elnora Ip, MD  Active Self, Pharmacy Records           Med Note (COFFELL, JON HERO   Fri Aug 31, 2024 11:36 AM) Patient admits non-compliance and only takes 2-3 times a week.  metoprolol  tartrate (LOPRESSOR ) 50 MG tablet [id number]  Take 1 tablet (50 mg total) by mouth 2 (two) times daily.  Patient taking differently: Take 50 mg by mouth daily.   Vann, Jessica U, DO  Active Self, Pharmacy Records           Med Note (COFFELL, JON HERO   Fri Aug 31, 2024 11:36 AM) Patient admits non-compliance and only takes 2-3 times a week.  predniSONE  (DELTASONE ) 20 MG tablet [id number]  Take 3 tablets once daily for 3 days THEN take 2 tablets once daily for 3 days THEN take 1 tablet once daily for 3 days  Patient not taking: Reported on 10/02/2024   Barbarann Nest, MD  Active             Recommendation:   Continue Current Plan of Care  Follow Up Plan:  Telephone follow-up in 1 week  Medford Balboa, BSN, RN [place]  VBCI - [facility] RN Care Manager [phone]

## 2024-10-10 ENCOUNTER — Other Ambulatory Visit: Payer: Self-pay

## 2024-10-11 ENCOUNTER — Telehealth: Payer: Self-pay

## 2024-10-13 DIAGNOSIS — Z23 Encounter for immunization: Secondary | ICD-10-CM | POA: Diagnosis not present

## 2024-10-16 ENCOUNTER — Other Ambulatory Visit

## 2024-10-16 NOTE — Transitions of Care (Post Inpatient/ED Visit) (Signed)
 Transition of Care week 6  Visit Note  10/16/2024  Name: [name] MRN: [id number]          DOB: 01-18-1951  Situation: Patient enrolled in [facility] 30-day program. Visit completed with Barnie Lady by telephone.   Background:   Initial Transition Care Management Follow-up Telephone Call Discharge Date and Diagnosis: No data recorded   Past Medical History:  Diagnosis Date   Acute sinusitis 01/09/2019   Acute sinusitis 01/09/2019   Anxiety    Arthritis    fingers, knees (08/16/2018)   Asthma    Atopic dermatitis 03/20/2018   Axillary hidradenitis suppurativa 10/13/2018   Cancer of right breast (HCC) 1991   s/p lumpectomy, chemotherapy and radiation therapy in 1991. Mammogram in 2007 was normal.   Cellulitis of buttock, left 12/13/2022   Constipated    h/o   COPD (chronic obstructive pulmonary disease) (HCC)    History of multiple hospital admissions for exercabation    COPD exacerbation (HCC) 01/01/2019   COPD exacerbation (HCC) 02/17/2024   COPD with acute exacerbation (HCC) 01/14/2019   COPD with exacerbation (HCC) 04/06/2009   Qualifier: Diagnosis of  By: Loletta MD, Vijay     Depression    Diarrhea    h/o   Facial cellulitis 08/30/2022   Facial edema 08/31/2022   GERD (gastroesophageal reflux disease)    Grade I diastolic dysfunction 01/12/2024   Headache    a few times/month (08/16/2018   Heart murmur 10/05/2011   first time I ever heard I had one was today   History of breast cancer 12/01/2012   Pt with h/o breast CA s/p lumpectomy with chemo/radiation in 1991. Pt mammogram 2011 was unremarkable. Had CT chest 10/2012 in ED for SOB and showed spiculated nodule with lymph node. 12/04/12: Birads 2; repeat diagnostic mammogram in 1 year.     Hyperlipidemia    Hypertension    Lower extremity edema 09/21/2018   Obesity    Personal history of chemotherapy    Personal history of radiation therapy    Pneumonia    couple times in the last 10-15 yrs  (08/16/2018)   QT prolongation 08/08/2014   Seasonal allergies 02/25/2017   Shortness of breath 10/05/2011   at rest; lying down; w/exertion   Sigmoid diverticulitis 80/2008   Tobacco abuse    Type 2 diabetes mellitus (HCC) 05/14/2009   Type II diabetes mellitus (HCC)     Assessment: Patient Reported Symptoms: Cognitive Cognitive Status: Alert and oriented to person, place, and time, Normal speech and language skills      Neurological Neurological Review of Symptoms: No symptoms reported    HEENT HEENT Symptoms Reported: No symptoms reported      Cardiovascular Cardiovascular Symptoms Reported: No symptoms reported Does patient have uncontrolled Hypertension?: Yes Is patient checking Blood Pressure at home?: No Cardiovascular Management Strategies: Medication therapy, Routine screening, Adequate rest  Respiratory Respiratory Symptoms Reported: Productive cough Additional Respiratory Details: The patient states she has not had any issues with SOB or wheezing. The carpets in her house have been replaced and she feels that was part of the problem with her repiratory failure. She continues to smoke cigarettes and states she is smoking at least a half of a pack. She is encouraged to stop smoking Respiratory Management Strategies: Routine screening, Adequate rest, Medication therapy  Endocrine Endocrine Symptoms Reported: No symptoms reported Is patient diabetic?: No    Gastrointestinal Gastrointestinal Symptoms Reported: No symptoms reported      Genitourinary Genitourinary Symptoms  Reported: No symptoms reported    Integumentary Integumentary Symptoms Reported: No symptoms reported    Musculoskeletal Musculoskelatal Symptoms Reviewed: No symptoms reported        Psychosocial Psychosocial Symptoms Reported: No symptoms reported         There were no vitals filed for this visit.    Medications Reviewed Today     Reviewed by [name], RN (Case Manager) on  10/16/24 at 1527  Med List Status: <None>   Medication Order Taking? Sig Documenting Provider Last Dose Status Informant  albuterol  (PROVENTIL ) (2.5 MG/3ML) 0.083% nebulizer solution [id number] No Take 3 mLs (2.5 mg total) by nebulization every 4 (four) hours as needed for wheezing or shortness of breath. Gomez-Caraballo, Maria, MD Unknown Active Self, Pharmacy Records  albuterol  (VENTOLIN  HFA) 108 [phone] Base) MCG/ACT inhaler [id number] No Inhale 2 puffs into the lungs every 6 (six) hours as needed for wheezing or shortness of breath. Jolaine Pac, DO Unknown Active Self, Pharmacy Records  amLODipine  (NORVASC ) 10 MG tablet [id number] No Take 1 tablet (10 mg total) by mouth daily. Fernand Prost, MD 09/05/2024 Morning Active Self, Pharmacy Records           Med Note (COFFELL, JON HERO   Fri Aug 31, 2024 11:36 AM) Patient admits non-compliance and only takes 2-3 times a week.  benzonatate  (TESSALON ) 200 MG capsule [id number]  Take 1 capsule (200 mg total) by mouth 2 (two) times daily as needed (cough). Barbarann Nest, MD  Active   budesonide  (PULMICORT ) 0.5 MG/2ML nebulizer solution [id number]  Take 2 mLs (0.5 mg total) by nebulization 2 (two) times daily. Barbarann Nest, MD  Active   busPIRone  (BUSPAR ) 5 MG tablet [id number]  Take 1 tablet (5 mg total) by mouth 2 (two) times daily. Barbarann Nest, MD  Active   fluticasone  (FLONASE ) 50 MCG/ACT nasal spray [id number] No Place 2 sprays into both nostrils daily.  Patient taking differently: Place 2 sprays into both nostrils daily as needed.   Sebastian Toribio GAILS, MD Unknown Active Self, Pharmacy Records  ipratropium-albuterol  (DUONEB) 0.5-2.5 (3) MG/3ML SOLN [id number]  Take 3 mLs by nebulization every 6 (six) hours. Barbarann Nest, MD  Active   loratadine  (CLARITIN ) 10 MG tablet [id number] No Take 1 tablet (10 mg total) by mouth daily.  Patient taking differently: Take 10 mg by mouth daily as needed for allergies.   Verdene Purchase, MD Unknown Active Self,  Pharmacy Records  losartan -hydrochlorothiazide  [facility]) 50-12.5 MG tablet [id number] No Take 1 tablet by mouth 2 (two) times daily.  Patient taking differently: Take 1 tablet by mouth daily.   Elnora Ip, MD 09/05/2024 Morning Active Self, Pharmacy Records           Med Note (COFFELL, JON HERO   Fri Aug 31, 2024 11:36 AM) Patient admits non-compliance and only takes 2-3 times a week.  metoprolol  tartrate (LOPRESSOR ) 50 MG tablet [id number] No Take 1 tablet (50 mg total) by mouth 2 (two) times daily.  Patient taking differently: Take 50 mg by mouth daily.   Vann, Jessica U, DO 09/05/2024 Morning Active Self, Pharmacy Records           Med Note (COFFELL, JON HERO   Fri Aug 31, 2024 11:36 AM) Patient admits non-compliance and only takes 2-3 times a week.  predniSONE  (DELTASONE ) 20 MG tablet [id number]  Take 3 tablets once daily for 3 days THEN take 2 tablets once daily for 3 days THEN take 1 tablet once daily for 3 days  Patient not taking:  Reported on 10/02/2024   Barbarann Nest, MD  Active             Recommendation:   Continue Current Plan of Care  Follow Up Plan:   Telephone follow-up in 1 week  Medford Balboa, BSN, RN [place]  VBCI - [facility] RN Care Manager [phone]

## 2024-10-16 NOTE — Patient Instructions (Signed)
 Visit Information  Thank you for taking time to visit with me today. Please don't hesitate to contact me if I can be of assistance to you before our next scheduled telephone appointment.  Our next appointment is by telephone on Tuesday December 16th at 2:30pm  Following is a copy of your care plan:   Goals Addressed             This Visit's Progress    VBCI Transitions of Care (TOC) Care Plan       Problems: (reviewed 10/16/24) Recent Hospitalization for treatment of COPD Hospital or ED Adm Risk of 62%  Goal: (reviewed 10/16/24) Over the next 30 days, the patient will not experience hospital readmission  Interventions: (reviewed 10/16/24)  COPD Interventions: Advised patient to track and manage COPD triggers Assessed social determinant of health barriers Discussed the importance of adequate rest and management of fatigue with COPD Provided education about and advised patient to utilize infection prevention strategies to reduce risk of respiratory infection Provided instruction about proper use of medications used for management of COPD including inhalers Screening for signs and symptoms of depression related to chronic disease state  Follow up with Pulmonology referral  Encourage smoking cessation or cut down to less than 5 cigarettes a day Prednisone  taper - Completed Duonebs added with every 6 hours with Albuterol  Budesonide  Nebs twice daily HHPT with [facility] - 09/26/24 - The patient has declined HHPT 10/16/24 - The patient continues to smoke about a half a pack of cigarettes a day. States they replaced the carpet in the house which was contributing to her COPD  Patient Self Care Activities: (reviewed 10/16/24) Attend all scheduled provider appointments Call pharmacy for medication refills 3-7 days in advance of running out of medications Call provider office for new concerns or questions  Notify RN Care Manager of [facility] call rescheduling needs Participate in Transition of  Care Program/Attend [facility] scheduled calls Perform all self care activities independently  Take medications as prescribed    Plan:  Telephone follow up appointment with care management team member scheduled for:  Tuesday December 16th at 2:30pm        Patient verbalizes understanding of instructions and care plan provided today and agrees to view in MyChart. Active MyChart status and patient understanding of how to access instructions and care plan via MyChart confirmed with patient.     The patient has been provided with contact information for the care management team and has been advised to call with any health related questions or concerns.   Please call the care guide team at [phone] if you need to cancel or reschedule your appointment.   Please call the Suicide and Crisis Lifeline: 988 call the [place]  National Suicide Prevention Lifeline: 706-116-5202 or TTY: 858 480 3639 TTY 519-298-1167) to talk to a trained counselor if you are experiencing a Mental Health or Behavioral Health Crisis or need someone to talk to.  Medford Balboa, BSN, RN [place]  VBCI - [employer] [facility] RN Care Manager [phone]

## 2024-10-23 ENCOUNTER — Telehealth: Payer: Self-pay

## 2024-10-29 ENCOUNTER — Other Ambulatory Visit: Payer: Self-pay

## 2024-10-29 NOTE — Patient Instructions (Signed)
 Visit Information  Thank you for taking time to visit with me today. Please don't hesitate to contact me if I can be of assistance to you before our next scheduled telephone appointment.   Following is a copy of your care plan:   Goals Addressed             This Visit's Progress    COMPLETED: VBCI Transitions of Care (TOC) Care Plan       Problems: (reviewed 10/29/24) Recent Hospitalization for treatment of COPD Hospital or ED Adm Risk of 62%  Goal: (reviewed 10/29/24) Over the next 30 days, the patient will not experience hospital readmission  Interventions: (reviewed 10/29/24)  COPD Interventions: Advised patient to track and manage COPD triggers Assessed social determinant of health barriers Discussed the importance of adequate rest and management of fatigue with COPD Provided education about and advised patient to utilize infection prevention strategies to reduce risk of respiratory infection Provided instruction about proper use of medications used for management of COPD including inhalers Screening for signs and symptoms of depression related to chronic disease state  Follow up with Pulmonology referral  Encourage smoking cessation or cut down to less than 5 cigarettes a day Prednisone  taper - Completed Duonebs added with every 6 hours with Albuterol  Budesonide  Nebs twice daily HHPT with [facility] - 09/26/24 - The patient has declined HHPT 10/16/24 - The patient continues to smoke about a half a pack of cigarettes a day. States they replaced the carpet in the house which was contributing to her COPD  Patient Self Care Activities: (reviewed 10/29/24) Attend all scheduled provider appointments Call pharmacy for medication refills 3-7 days in advance of running out of medications Call provider office for new concerns or questions  Notify RN Care Manager of TOC call rescheduling needs Participate in Transition of Care Program/Attend TOC scheduled calls Perform all self  care activities independently  Take medications as prescribed    Plan:  Telephone follow up appointment with care management team member scheduled for:  The patient has met the goals for the 30 Day TOC program and is discharged        Patient verbalizes understanding of instructions and care plan provided today and agrees to view in MyChart. Active MyChart status and patient understanding of how to access instructions and care plan via MyChart confirmed with patient.     The patient has been provided with contact information for the care management team and has been advised to call with any health related questions or concerns.   Please call the care guide team at [phone] if you need to cancel or reschedule your appointment.   Please call the Suicide and Crisis Lifeline: 988 call the [place]  National Suicide Prevention Lifeline: (229)535-7848 or TTY: (417)413-3900 TTY 667-181-5720) to talk to a trained counselor if you are experiencing a Mental Health or Behavioral Health Crisis or need someone to talk to.  Medford Balboa, BSN, RN [place]  VBCI - [employer] [facility] RN Care Manager [phone]

## 2024-10-29 NOTE — Transitions of Care (Post Inpatient/ED Visit) (Signed)
 " Transition of Care Week 6  Visit Note  10/29/2024  Name: [name] MRN: [id number]          DOB: June 09, 1951  Situation: Patient enrolled in [facility] 30-day program. Visit completed with Barnie Lady by telephone.   Background:   Initial Transition Care Management Follow-up Telephone Call Discharge Date and Diagnosis: No data recorded   Past Medical History:  Diagnosis Date   Acute sinusitis 01/09/2019   Acute sinusitis 01/09/2019   Anxiety    Arthritis    fingers, knees (08/16/2018)   Asthma    Atopic dermatitis 03/20/2018   Axillary hidradenitis suppurativa 10/13/2018   Cancer of right breast (HCC) 1991   s/p lumpectomy, chemotherapy and radiation therapy in 1991. Mammogram in 2007 was normal.   Cellulitis of buttock, left 12/13/2022   Constipated    h/o   COPD (chronic obstructive pulmonary disease) (HCC)    History of multiple hospital admissions for exercabation    COPD exacerbation (HCC) 01/01/2019   COPD exacerbation (HCC) 02/17/2024   COPD with acute exacerbation (HCC) 01/14/2019   COPD with exacerbation (HCC) 04/06/2009   Qualifier: Diagnosis of  By: Loletta MD, Vijay     Depression    Diarrhea    h/o   Facial cellulitis 08/30/2022   Facial edema 08/31/2022   GERD (gastroesophageal reflux disease)    Grade I diastolic dysfunction 01/12/2024   Headache    a few times/month (08/16/2018   Heart murmur 10/05/2011   first time I ever heard I had one was today   History of breast cancer 12/01/2012   Pt with h/o breast CA s/p lumpectomy with chemo/radiation in 1991. Pt mammogram 2011 was unremarkable. Had CT chest 10/2012 in ED for SOB and showed spiculated nodule with lymph node. 12/04/12: Birads 2; repeat diagnostic mammogram in 1 year.     Hyperlipidemia    Hypertension    Lower extremity edema 09/21/2018   Obesity    Personal history of chemotherapy    Personal history of radiation therapy    Pneumonia    couple times in the last 10-15 yrs  (08/16/2018)   QT prolongation 08/08/2014   Seasonal allergies 02/25/2017   Shortness of breath 10/05/2011   at rest; lying down; w/exertion   Sigmoid diverticulitis 80/2008   Tobacco abuse    Type 2 diabetes mellitus (HCC) 05/14/2009   Type II diabetes mellitus (HCC)     Assessment: Patient Reported Symptoms: Cognitive Cognitive Status: Alert and oriented to person, place, and time, Normal speech and language skills      Neurological Neurological Review of Symptoms: No symptoms reported    HEENT HEENT Symptoms Reported: No symptoms reported      Cardiovascular Cardiovascular Symptoms Reported: No symptoms reported Cardiovascular Management Strategies: Medication therapy, Routine screening, Adequate rest  Respiratory Respiratory Symptoms Reported: Productive cough Additional Respiratory Details: The patient continues to smoke some cigarettes. She has a a cough but she states overall she is doing well. She has managed to stay out of the hospital for 30 days. Respiratory Management Strategies: Routine screening, Medication therapy  Endocrine Endocrine Symptoms Reported: No symptoms reported Is patient diabetic?: No    Gastrointestinal Gastrointestinal Symptoms Reported: No symptoms reported      Genitourinary Genitourinary Symptoms Reported: No symptoms reported    Integumentary Integumentary Symptoms Reported: No symptoms reported    Musculoskeletal Musculoskelatal Symptoms Reviewed: No symptoms reported Musculoskeletal Management Strategies: Routine screening, Coping strategies      Psychosocial  There were no vitals filed for this visit.    Medications Reviewed Today     Reviewed by [name], RN (Case Manager) on 10/29/24 at 1535  Med List Status: <None>   Medication Order Taking? Sig Documenting Provider Last Dose Status Informant  albuterol  (PROVENTIL ) (2.5 MG/3ML) 0.083% nebulizer solution [id number] No Take 3 mLs (2.5 mg total) by  nebulization every 4 (four) hours as needed for wheezing or shortness of breath. Gomez-Caraballo, Maria, MD Unknown Active Self, Pharmacy Records  albuterol  (VENTOLIN  HFA) 108 [phone] Base) MCG/ACT inhaler [id number] No Inhale 2 puffs into the lungs every 6 (six) hours as needed for wheezing or shortness of breath. Jolaine Pac, DO Unknown Active Self, Pharmacy Records  amLODipine  (NORVASC ) 10 MG tablet [id number] No Take 1 tablet (10 mg total) by mouth daily. Fernand Prost, MD 09/05/2024 Morning Active Self, Pharmacy Records           Med Note (COFFELL, JON HERO   Fri Aug 31, 2024 11:36 AM) Patient admits non-compliance and only takes 2-3 times a week.  benzonatate  (TESSALON ) 200 MG capsule [id number]  Take 1 capsule (200 mg total) by mouth 2 (two) times daily as needed (cough). Barbarann Nest, MD  Active   budesonide  (PULMICORT ) 0.5 MG/2ML nebulizer solution [id number]  Take 2 mLs (0.5 mg total) by nebulization 2 (two) times daily. Barbarann Nest, MD  Active   busPIRone  (BUSPAR ) 5 MG tablet [id number]  Take 1 tablet (5 mg total) by mouth 2 (two) times daily. Barbarann Nest, MD  Active   fluticasone  (FLONASE ) 50 MCG/ACT nasal spray [id number] No Place 2 sprays into both nostrils daily.  Patient taking differently: Place 2 sprays into both nostrils daily as needed.   Sebastian Toribio GAILS, MD Unknown Active Self, Pharmacy Records  ipratropium-albuterol  (DUONEB) 0.5-2.5 (3) MG/3ML SOLN [id number]  Take 3 mLs by nebulization every 6 (six) hours. Barbarann Nest, MD  Active   loratadine  (CLARITIN ) 10 MG tablet [id number] No Take 1 tablet (10 mg total) by mouth daily.  Patient taking differently: Take 10 mg by mouth daily as needed for allergies.   Verdene Purchase, MD Unknown Active Self, Pharmacy Records  losartan -hydrochlorothiazide  [facility]) 50-12.5 MG tablet [id number] No Take 1 tablet by mouth 2 (two) times daily.  Patient taking differently: Take 1 tablet by mouth daily.   Elnora Ip, MD  09/05/2024 Morning Active Self, Pharmacy Records           Med Note (COFFELL, JON HERO   Fri Aug 31, 2024 11:36 AM) Patient admits non-compliance and only takes 2-3 times a week.  metoprolol  tartrate (LOPRESSOR ) 50 MG tablet [id number] No Take 1 tablet (50 mg total) by mouth 2 (two) times daily.  Patient taking differently: Take 50 mg by mouth daily.   Vann, Jessica U, DO 09/05/2024 Morning Active Self, Pharmacy Records           Med Note (COFFELL, JON HERO   Fri Aug 31, 2024 11:36 AM) Patient admits non-compliance and only takes 2-3 times a week.  predniSONE  (DELTASONE ) 20 MG tablet [id number]  Take 3 tablets once daily for 3 days THEN take 2 tablets once daily for 3 days THEN take 1 tablet once daily for 3 days  Patient not taking: Reported on 10/02/2024   Barbarann Nest, MD  Active             Recommendation:   Discharge from the [facility] 30 Day Program  Follow Up Plan:   Closing From:  Transitions of Care Program  Medford Balboa, BSN, RN [place]  [facility] - Population Health RN Care Manager [phone]     "
